# Patient Record
Sex: Female | Born: 1965 | Race: Black or African American | Hispanic: No | Marital: Single | Smoking: Never smoker
Health system: Southern US, Community
[De-identification: ages and names within clinical notes are randomized; demographics above are authoritative.]

## PROBLEM LIST (undated history)

## (undated) DIAGNOSIS — F319 Bipolar disorder, unspecified: Secondary | ICD-10-CM

## (undated) DIAGNOSIS — I10 Essential (primary) hypertension: Secondary | ICD-10-CM

## (undated) DIAGNOSIS — F209 Schizophrenia, unspecified: Secondary | ICD-10-CM

## (undated) DIAGNOSIS — Z8619 Personal history of other infectious and parasitic diseases: Secondary | ICD-10-CM

## (undated) DIAGNOSIS — Z8739 Personal history of other diseases of the musculoskeletal system and connective tissue: Secondary | ICD-10-CM

## (undated) DIAGNOSIS — Z8639 Personal history of other endocrine, nutritional and metabolic disease: Secondary | ICD-10-CM

## (undated) DIAGNOSIS — Z8679 Personal history of other diseases of the circulatory system: Secondary | ICD-10-CM

## (undated) DIAGNOSIS — Z87440 Personal history of urinary (tract) infections: Secondary | ICD-10-CM

## (undated) DIAGNOSIS — R7989 Other specified abnormal findings of blood chemistry: Secondary | ICD-10-CM

## (undated) DIAGNOSIS — Z8659 Personal history of other mental and behavioral disorders: Secondary | ICD-10-CM

## (undated) DIAGNOSIS — Z8249 Family history of ischemic heart disease and other diseases of the circulatory system: Secondary | ICD-10-CM

## (undated) HISTORY — DX: Personal history of other diseases of the musculoskeletal system and connective tissue: Z87.39

## (undated) HISTORY — DX: Family history of ischemic heart disease and other diseases of the circulatory system: Z82.49

## (undated) HISTORY — DX: Personal history of other infectious and parasitic diseases: Z86.19

## (undated) HISTORY — PX: MULTIPLE TOOTH EXTRACTIONS: SHX2053

## (undated) HISTORY — DX: Personal history of other mental and behavioral disorders: Z86.59

## (undated) HISTORY — DX: Bipolar disorder, unspecified: F31.9

## (undated) HISTORY — PX: CYST REMOVAL NECK: SHX6281

## (undated) HISTORY — DX: Essential (primary) hypertension: I10

## (undated) HISTORY — DX: Personal history of urinary (tract) infections: Z87.440

## (undated) HISTORY — PX: ABLATION ON ENDOMETRIOSIS: SHX5787

## (undated) HISTORY — DX: Personal history of other diseases of the circulatory system: Z86.79

## (undated) HISTORY — DX: Personal history of other endocrine, nutritional and metabolic disease: Z86.39

---

## 1998-05-13 ENCOUNTER — Ambulatory Visit (HOSPITAL_COMMUNITY): Admission: RE | Admit: 1998-05-13 | Discharge: 1998-05-13 | Payer: Self-pay

## 1999-02-08 ENCOUNTER — Other Ambulatory Visit: Admission: RE | Admit: 1999-02-08 | Discharge: 1999-02-08 | Payer: Self-pay | Admitting: Family Medicine

## 2003-12-17 ENCOUNTER — Ambulatory Visit (HOSPITAL_COMMUNITY): Admission: RE | Admit: 2003-12-17 | Discharge: 2003-12-17 | Payer: Self-pay | Admitting: Family Medicine

## 2004-10-17 ENCOUNTER — Ambulatory Visit: Payer: Self-pay | Admitting: Internal Medicine

## 2005-01-18 ENCOUNTER — Ambulatory Visit: Payer: Self-pay | Admitting: Internal Medicine

## 2005-01-27 ENCOUNTER — Ambulatory Visit (HOSPITAL_COMMUNITY): Admission: RE | Admit: 2005-01-27 | Discharge: 2005-01-27 | Payer: Self-pay | Admitting: Internal Medicine

## 2005-01-30 ENCOUNTER — Ambulatory Visit: Payer: Self-pay | Admitting: Family Medicine

## 2005-03-30 ENCOUNTER — Ambulatory Visit (HOSPITAL_COMMUNITY): Admission: RE | Admit: 2005-03-30 | Discharge: 2005-03-30 | Payer: Self-pay | Admitting: Internal Medicine

## 2005-03-30 ENCOUNTER — Ambulatory Visit: Payer: Self-pay | Admitting: Family Medicine

## 2005-03-31 ENCOUNTER — Ambulatory Visit: Payer: Self-pay | Admitting: *Deleted

## 2005-04-05 ENCOUNTER — Ambulatory Visit: Payer: Self-pay | Admitting: Family Medicine

## 2005-04-19 ENCOUNTER — Ambulatory Visit: Payer: Self-pay | Admitting: Family Medicine

## 2005-07-26 ENCOUNTER — Ambulatory Visit: Payer: Self-pay | Admitting: Family Medicine

## 2005-10-25 ENCOUNTER — Ambulatory Visit: Payer: Self-pay | Admitting: Family Medicine

## 2006-01-24 ENCOUNTER — Ambulatory Visit: Payer: Self-pay | Admitting: Family Medicine

## 2006-01-30 ENCOUNTER — Ambulatory Visit (HOSPITAL_COMMUNITY): Admission: RE | Admit: 2006-01-30 | Discharge: 2006-01-30 | Payer: Self-pay | Admitting: Family Medicine

## 2006-02-26 ENCOUNTER — Ambulatory Visit: Payer: Self-pay | Admitting: Family Medicine

## 2006-04-25 ENCOUNTER — Ambulatory Visit: Payer: Self-pay | Admitting: Internal Medicine

## 2006-07-25 ENCOUNTER — Ambulatory Visit: Payer: Self-pay | Admitting: Family Medicine

## 2006-10-24 ENCOUNTER — Ambulatory Visit: Payer: Self-pay | Admitting: Internal Medicine

## 2006-12-24 ENCOUNTER — Encounter (INDEPENDENT_AMBULATORY_CARE_PROVIDER_SITE_OTHER): Payer: Self-pay | Admitting: Family Medicine

## 2006-12-24 ENCOUNTER — Ambulatory Visit: Payer: Self-pay | Admitting: Family Medicine

## 2007-02-06 ENCOUNTER — Ambulatory Visit (HOSPITAL_COMMUNITY): Admission: RE | Admit: 2007-02-06 | Discharge: 2007-02-06 | Payer: Self-pay | Admitting: Family Medicine

## 2007-02-20 ENCOUNTER — Ambulatory Visit: Payer: Self-pay | Admitting: Family Medicine

## 2007-06-17 ENCOUNTER — Ambulatory Visit: Payer: Self-pay | Admitting: Internal Medicine

## 2007-08-14 ENCOUNTER — Encounter (INDEPENDENT_AMBULATORY_CARE_PROVIDER_SITE_OTHER): Payer: Self-pay | Admitting: *Deleted

## 2007-10-23 ENCOUNTER — Ambulatory Visit: Payer: Self-pay | Admitting: Internal Medicine

## 2008-02-11 ENCOUNTER — Ambulatory Visit (HOSPITAL_COMMUNITY): Admission: RE | Admit: 2008-02-11 | Discharge: 2008-02-11 | Payer: Self-pay | Admitting: Family Medicine

## 2008-03-04 ENCOUNTER — Encounter: Payer: Self-pay | Admitting: Family Medicine

## 2008-03-04 ENCOUNTER — Ambulatory Visit: Payer: Self-pay | Admitting: Internal Medicine

## 2008-03-04 LAB — CONVERTED CEMR LAB
Chlamydia, DNA Probe: NEGATIVE
GC Probe Amp, Genital: NEGATIVE

## 2008-03-23 ENCOUNTER — Ambulatory Visit: Payer: Self-pay | Admitting: Internal Medicine

## 2008-03-30 ENCOUNTER — Encounter: Payer: Self-pay | Admitting: Family Medicine

## 2008-03-30 ENCOUNTER — Ambulatory Visit: Payer: Self-pay | Admitting: Family Medicine

## 2008-03-30 LAB — CONVERTED CEMR LAB
ALT: 18 units/L (ref 0–35)
AST: 15 units/L (ref 0–37)
Albumin: 4.4 g/dL (ref 3.5–5.2)
Alkaline Phosphatase: 47 units/L (ref 39–117)
BUN: 11 mg/dL (ref 6–23)
Basophils Absolute: 0.1 10*3/uL (ref 0.0–0.1)
Basophils Relative: 1 % (ref 0–1)
CO2: 23 meq/L (ref 19–32)
Calcium: 9.6 mg/dL (ref 8.4–10.5)
Chloride: 108 meq/L (ref 96–112)
Cholesterol: 152 mg/dL (ref 0–200)
Creatinine, Ser: 1.02 mg/dL (ref 0.40–1.20)
Eosinophils Absolute: 0.1 10*3/uL (ref 0.0–0.7)
Eosinophils Relative: 3 % (ref 0–5)
Glucose, Bld: 98 mg/dL (ref 70–99)
HCT: 41.5 % (ref 36.0–46.0)
HDL: 50 mg/dL (ref >39)
Hemoglobin: 13.5 g/dL (ref 12.0–15.0)
LDL Cholesterol: 74 mg/dL (ref 0–99)
Lymphocytes Relative: 37 % (ref 12–46)
Lymphs Abs: 1.8 10*3/uL (ref 0.7–4.0)
MCHC: 32.5 g/dL (ref 30.0–36.0)
MCV: 89.8 fL (ref 78.0–100.0)
Monocytes Absolute: 0.4 10*3/uL (ref 0.1–1.0)
Monocytes Relative: 8 % (ref 3–12)
Neutro Abs: 2.5 10*3/uL (ref 1.7–7.7)
Neutrophils Relative %: 51 % (ref 43–77)
Platelets: 209 10*3/uL (ref 150–400)
Potassium: 4.1 meq/L (ref 3.5–5.3)
RBC: 4.62 M/uL (ref 3.87–5.11)
RDW: 13.6 % (ref 11.5–15.5)
Sodium: 141 meq/L (ref 135–145)
TSH: 1.051 microintl units/mL (ref 0.350–5.50)
Total Bilirubin: 0.5 mg/dL (ref 0.3–1.2)
Total CHOL/HDL Ratio: 3
Total Protein: 6.8 g/dL (ref 6.0–8.3)
Triglycerides: 138 mg/dL (ref <150)
VLDL: 28 mg/dL (ref 0–40)
WBC: 4.8 10*3/uL (ref 4.0–10.5)

## 2008-04-07 ENCOUNTER — Ambulatory Visit: Payer: Self-pay | Admitting: Internal Medicine

## 2008-08-12 ENCOUNTER — Ambulatory Visit: Payer: Self-pay | Admitting: Internal Medicine

## 2009-02-10 ENCOUNTER — Encounter: Payer: Self-pay | Admitting: Family Medicine

## 2009-02-10 ENCOUNTER — Ambulatory Visit: Payer: Self-pay | Admitting: Internal Medicine

## 2009-02-10 LAB — CONVERTED CEMR LAB
ALT: 12 units/L (ref 0–35)
AST: 11 units/L (ref 0–37)
Albumin: 4.3 g/dL (ref 3.5–5.2)
Alkaline Phosphatase: 49 units/L (ref 39–117)
Bilirubin, Direct: 0.2 mg/dL (ref 0.0–0.3)
Indirect Bilirubin: 0.5 mg/dL (ref 0.0–0.9)
Total Bilirubin: 0.7 mg/dL (ref 0.3–1.2)
Total Protein: 6.9 g/dL (ref 6.0–8.3)

## 2009-02-17 ENCOUNTER — Ambulatory Visit (HOSPITAL_COMMUNITY): Admission: RE | Admit: 2009-02-17 | Discharge: 2009-02-17 | Payer: Self-pay | Admitting: Family Medicine

## 2009-05-12 ENCOUNTER — Ambulatory Visit: Payer: Self-pay | Admitting: Internal Medicine

## 2009-11-25 ENCOUNTER — Ambulatory Visit: Payer: Self-pay | Admitting: Internal Medicine

## 2009-12-28 ENCOUNTER — Encounter (INDEPENDENT_AMBULATORY_CARE_PROVIDER_SITE_OTHER): Payer: Self-pay | Admitting: Adult Health

## 2009-12-28 ENCOUNTER — Ambulatory Visit: Payer: Self-pay | Admitting: Internal Medicine

## 2009-12-28 LAB — CONVERTED CEMR LAB
Chlamydia, DNA Probe: NEGATIVE
GC Probe Amp, Genital: NEGATIVE
Microalb, Ur: 0.5 mg/dL (ref 0.00–1.89)
Pap Smear: NEGATIVE

## 2010-01-11 ENCOUNTER — Encounter (INDEPENDENT_AMBULATORY_CARE_PROVIDER_SITE_OTHER): Payer: Self-pay | Admitting: Adult Health

## 2010-01-11 ENCOUNTER — Ambulatory Visit: Payer: Self-pay | Admitting: Internal Medicine

## 2010-01-11 LAB — CONVERTED CEMR LAB
ALT: 15 units/L (ref 0–35)
AST: 12 units/L (ref 0–37)
Albumin: 4.4 g/dL (ref 3.5–5.2)
Alkaline Phosphatase: 59 units/L (ref 39–117)
BUN: 8 mg/dL (ref 6–23)
Basophils Absolute: 0 10*3/uL (ref 0.0–0.1)
Basophils Relative: 1 % (ref 0–1)
CO2: 22 meq/L (ref 19–32)
Calcium: 10.4 mg/dL (ref 8.4–10.5)
Chloride: 106 meq/L (ref 96–112)
Cholesterol: 165 mg/dL (ref 0–200)
Creatinine, Ser: 0.94 mg/dL (ref 0.40–1.20)
Eosinophils Absolute: 0.2 10*3/uL (ref 0.0–0.7)
Eosinophils Relative: 3 % (ref 0–5)
Glucose, Bld: 101 mg/dL — ABNORMAL HIGH (ref 70–99)
HCT: 42.9 % (ref 36.0–46.0)
HDL: 41 mg/dL (ref >39)
Hemoglobin: 13.7 g/dL (ref 12.0–15.0)
LDL Cholesterol: 67 mg/dL (ref 0–99)
Lithium Lvl: 0.57 meq/L — ABNORMAL LOW (ref 0.80–1.40)
Lymphocytes Relative: 33 % (ref 12–46)
Lymphs Abs: 1.7 10*3/uL (ref 0.7–4.0)
MCHC: 31.9 g/dL (ref 30.0–36.0)
MCV: 89 fL (ref 78.0–100.0)
Monocytes Absolute: 0.4 10*3/uL (ref 0.1–1.0)
Monocytes Relative: 7 % (ref 3–12)
Neutro Abs: 3 10*3/uL (ref 1.7–7.7)
Neutrophils Relative %: 57 % (ref 43–77)
Platelets: 208 10*3/uL (ref 150–400)
Potassium: 4.2 meq/L (ref 3.5–5.3)
RBC: 4.82 M/uL (ref 3.87–5.11)
RDW: 13.5 % (ref 11.5–15.5)
Sodium: 141 meq/L (ref 135–145)
TSH: 1.078 microintl units/mL (ref 0.350–4.500)
Total Bilirubin: 0.3 mg/dL (ref 0.3–1.2)
Total CHOL/HDL Ratio: 4
Total Protein: 6.7 g/dL (ref 6.0–8.3)
Triglycerides: 285 mg/dL — ABNORMAL HIGH (ref <150)
VLDL: 57 mg/dL — ABNORMAL HIGH (ref 0–40)
WBC: 5.3 10*3/uL (ref 4.0–10.5)

## 2010-01-25 ENCOUNTER — Ambulatory Visit: Payer: Self-pay | Admitting: Internal Medicine

## 2010-02-09 ENCOUNTER — Encounter (INDEPENDENT_AMBULATORY_CARE_PROVIDER_SITE_OTHER): Payer: Self-pay | Admitting: Adult Health

## 2010-02-09 ENCOUNTER — Ambulatory Visit: Payer: Self-pay | Admitting: Family Medicine

## 2010-02-09 LAB — CONVERTED CEMR LAB
Sed Rate: 2 mm/hr (ref 0–22)
Uric Acid, Serum: 7.4 mg/dL — ABNORMAL HIGH (ref 2.4–7.0)

## 2010-03-17 ENCOUNTER — Ambulatory Visit (HOSPITAL_COMMUNITY): Admission: RE | Admit: 2010-03-17 | Discharge: 2010-03-17 | Payer: Self-pay | Admitting: Internal Medicine

## 2010-03-29 ENCOUNTER — Ambulatory Visit: Payer: Self-pay | Admitting: Internal Medicine

## 2010-06-06 ENCOUNTER — Ambulatory Visit: Payer: Self-pay | Admitting: Family Medicine

## 2010-12-27 NOTE — Miscellaneous (Signed)
Summary: VIP  Patient: Paula Dickson Note: All result statuses are Final unless otherwise noted.  Tests: (1) VIP (Medications)   LLIMPORTMEDS              "Result Below..."       RESULT: AMOXICILLIN CAPS 500 MG*TAKE TWO CAPSULES NOW THEN TAKE ONE CAPSULE FOUR TIMES DAILY*10/23/2006*Last Refill: Unknown*[id number]*******   LLIMPORTALLS              NKDA***  Note: An exclamation mark (!) indicates a result that was not dispersed into the flowsheet. Document Creation Date: 09/26/2007 3:02 PM _______________________________________________________________________  (1) Order result status: Final Collection or observation date-time: 08/14/2007 Requested date-time: 08/14/2007 Receipt date-time:  Reported date-time: 08/14/2007 Referring Physician:   Ordering Physician:   Specimen Source:  Source: [facility] [place] Order Number:  Lab site:

## 2011-02-13 ENCOUNTER — Other Ambulatory Visit (HOSPITAL_COMMUNITY): Payer: Self-pay | Admitting: Family Medicine

## 2011-02-13 DIAGNOSIS — Z1231 Encounter for screening mammogram for malignant neoplasm of breast: Secondary | ICD-10-CM

## 2011-03-20 ENCOUNTER — Ambulatory Visit (HOSPITAL_COMMUNITY): Payer: Self-pay

## 2011-04-03 ENCOUNTER — Ambulatory Visit (HOSPITAL_COMMUNITY)
Admission: RE | Admit: 2011-04-03 | Discharge: 2011-04-03 | Disposition: A | Discharge status: Home / Self Care | Payer: Self-pay | Source: Ambulatory Visit | Attending: Family Medicine | Admitting: Family Medicine

## 2011-04-03 DIAGNOSIS — Z1231 Encounter for screening mammogram for malignant neoplasm of breast: Secondary | ICD-10-CM | POA: Insufficient documentation

## 2012-02-23 ENCOUNTER — Other Ambulatory Visit (HOSPITAL_COMMUNITY): Payer: Self-pay | Admitting: Family Medicine

## 2012-02-23 DIAGNOSIS — Z1231 Encounter for screening mammogram for malignant neoplasm of breast: Secondary | ICD-10-CM

## 2012-04-04 ENCOUNTER — Ambulatory Visit (HOSPITAL_COMMUNITY): Payer: Self-pay

## 2012-04-30 ENCOUNTER — Ambulatory Visit (HOSPITAL_COMMUNITY): Payer: Self-pay

## 2012-05-14 ENCOUNTER — Ambulatory Visit (HOSPITAL_COMMUNITY): Payer: Self-pay

## 2012-05-21 ENCOUNTER — Ambulatory Visit (HOSPITAL_COMMUNITY)
Admission: RE | Admit: 2012-05-21 | Discharge: 2012-05-21 | Disposition: A | Discharge status: Home / Self Care | Payer: Self-pay | Source: Ambulatory Visit | Attending: Family Medicine | Admitting: Family Medicine

## 2012-05-21 DIAGNOSIS — Z1231 Encounter for screening mammogram for malignant neoplasm of breast: Secondary | ICD-10-CM | POA: Insufficient documentation

## 2012-07-12 ENCOUNTER — Institutional Professional Consult (permissible substitution): Payer: Self-pay | Admitting: Cardiovascular Disease

## 2013-01-29 ENCOUNTER — Other Ambulatory Visit (HOSPITAL_COMMUNITY): Payer: Self-pay | Admitting: Internal Medicine

## 2013-01-29 DIAGNOSIS — Z1231 Encounter for screening mammogram for malignant neoplasm of breast: Secondary | ICD-10-CM

## 2013-05-26 ENCOUNTER — Ambulatory Visit (HOSPITAL_COMMUNITY)
Admission: RE | Admit: 2013-05-26 | Discharge: 2013-05-26 | Disposition: A | Discharge status: Home / Self Care | Payer: Self-pay | Source: Ambulatory Visit | Attending: Internal Medicine | Admitting: Internal Medicine

## 2013-05-26 DIAGNOSIS — Z1231 Encounter for screening mammogram for malignant neoplasm of breast: Secondary | ICD-10-CM

## 2013-06-12 ENCOUNTER — Ambulatory Visit: Payer: Self-pay | Admitting: Family Medicine

## 2013-09-25 ENCOUNTER — Telehealth: Payer: Self-pay

## 2013-09-25 NOTE — Telephone Encounter (Signed)
Pt is not a patient with [facility] but pt wanted to know if we gave free flu vaccines for people without insurance. I advised not to my knowledge. Pt voiced understanding.

## 2013-11-21 ENCOUNTER — Telehealth: Payer: Self-pay

## 2013-11-21 NOTE — Telephone Encounter (Signed)
Pt has 3 different offices that pt will be requesting medical records prior to seeing Dr Alphonsus Sias; pt will come by office and sign record releases for the 3 offices today.

## 2014-01-28 ENCOUNTER — Ambulatory Visit: Payer: Self-pay | Admitting: Internal Medicine

## 2014-02-19 ENCOUNTER — Ambulatory Visit: Payer: Self-pay | Admitting: Family Medicine

## 2014-04-02 ENCOUNTER — Other Ambulatory Visit (HOSPITAL_COMMUNITY)
Admission: RE | Admit: 2014-04-02 | Discharge: 2014-04-02 | Disposition: A | Discharge status: Home / Self Care | Payer: 59 | Source: Ambulatory Visit | Attending: Family Medicine | Admitting: Family Medicine

## 2014-04-02 ENCOUNTER — Encounter: Payer: Self-pay | Admitting: Family Medicine

## 2014-04-02 ENCOUNTER — Ambulatory Visit (INDEPENDENT_AMBULATORY_CARE_PROVIDER_SITE_OTHER): Payer: 59 | Admitting: Family Medicine

## 2014-04-02 ENCOUNTER — Telehealth: Payer: Self-pay | Admitting: Family Medicine

## 2014-04-02 VITALS — BP 142/90 | HR 80 | Temp 98.0°F | Ht 62.5 in | Wt 177.8 lb

## 2014-04-02 DIAGNOSIS — Z1151 Encounter for screening for human papillomavirus (HPV): Secondary | ICD-10-CM | POA: Insufficient documentation

## 2014-04-02 DIAGNOSIS — F319 Bipolar disorder, unspecified: Secondary | ICD-10-CM

## 2014-04-02 DIAGNOSIS — Z1211 Encounter for screening for malignant neoplasm of colon: Secondary | ICD-10-CM

## 2014-04-02 DIAGNOSIS — Z136 Encounter for screening for cardiovascular disorders: Secondary | ICD-10-CM

## 2014-04-02 DIAGNOSIS — Z0000 Encounter for general adult medical examination without abnormal findings: Secondary | ICD-10-CM

## 2014-04-02 DIAGNOSIS — Z01419 Encounter for gynecological examination (general) (routine) without abnormal findings: Secondary | ICD-10-CM | POA: Insufficient documentation

## 2014-04-02 DIAGNOSIS — I10 Essential (primary) hypertension: Secondary | ICD-10-CM

## 2014-04-02 LAB — COMPREHENSIVE METABOLIC PANEL
ALT: 23 U/L (ref 0–35)
AST: 22 U/L (ref 0–37)
Albumin: 4.2 g/dL (ref 3.5–5.2)
Alkaline Phosphatase: 46 U/L (ref 39–117)
BUN: 11 mg/dL (ref 6–23)
CO2: 32 mEq/L (ref 19–32)
Calcium: 10.1 mg/dL (ref 8.4–10.5)
Chloride: 99 mEq/L (ref 96–112)
Creatinine, Ser: 1.1 mg/dL (ref 0.4–1.2)
GFR: 66.86 mL/min (ref >60.00)
Glucose, Bld: 91 mg/dL (ref 70–99)
Potassium: 3.2 mEq/L — ABNORMAL LOW (ref 3.5–5.1)
Sodium: 137 mEq/L (ref 135–145)
Total Bilirubin: 0.7 mg/dL (ref 0.2–1.2)
Total Protein: 7.4 g/dL (ref 6.0–8.3)

## 2014-04-02 LAB — CBC WITH DIFFERENTIAL/PLATELET
Basophils Absolute: 0 10*3/uL (ref 0.0–0.1)
Basophils Relative: 0.6 % (ref 0.0–3.0)
Eosinophils Absolute: 0.1 10*3/uL (ref 0.0–0.7)
Eosinophils Relative: 1.7 % (ref 0.0–5.0)
HCT: 42.8 % (ref 36.0–46.0)
Hemoglobin: 14.3 g/dL (ref 12.0–15.0)
Lymphocytes Relative: 31.1 % (ref 12.0–46.0)
Lymphs Abs: 2.1 10*3/uL (ref 0.7–4.0)
MCHC: 33.5 g/dL (ref 30.0–36.0)
MCV: 85.5 fl (ref 78.0–100.0)
Monocytes Absolute: 0.6 10*3/uL (ref 0.1–1.0)
Monocytes Relative: 8.6 % (ref 3.0–12.0)
Neutro Abs: 3.9 10*3/uL (ref 1.4–7.7)
Neutrophils Relative %: 58 % (ref 43.0–77.0)
Platelets: 249 10*3/uL (ref 150.0–400.0)
RBC: 5 Mil/uL (ref 3.87–5.11)
RDW: 13.2 % (ref 11.5–15.5)
WBC: 6.8 10*3/uL (ref 4.0–10.5)

## 2014-04-02 LAB — LIPID PANEL
Cholesterol: 164 mg/dL (ref 0–200)
HDL: 44.9 mg/dL (ref >39.00)
LDL Cholesterol: 79 mg/dL (ref 0–99)
Total CHOL/HDL Ratio: 4
Triglycerides: 201 mg/dL — ABNORMAL HIGH (ref 0.0–149.0)
VLDL: 40.2 mg/dL — ABNORMAL HIGH (ref 0.0–40.0)

## 2014-04-02 LAB — TSH: TSH: 0.53 u[IU]/mL (ref 0.35–4.50)

## 2014-04-02 NOTE — Progress Notes (Signed)
Pre visit review using our clinic review tool, if applicable. No additional management support is needed unless otherwise documented below in the visit note. 

## 2014-04-02 NOTE — Assessment & Plan Note (Signed)
Pap smear today. 

## 2014-04-02 NOTE — Assessment & Plan Note (Signed)
Stable.  Followed by psych (awaiting records).

## 2014-04-02 NOTE — Patient Instructions (Signed)
Great to meet you. We will call you with your lab results and you can view them online.  We will call you with your referral for a colonoscopy.

## 2014-04-02 NOTE — Assessment & Plan Note (Signed)
Mildly elevated but reports h/o white coat HTN. Asymptomatic, continue to monitor.

## 2014-04-02 NOTE — Progress Notes (Signed)
Subjective:   Patient ID: Paula Dickson, female    DOB: 1966-11-11, 48 y.o.   MRN: [id number]  Marquise Lambson is a pleasant 48 y.o. year old female who presents to clinic today with [place]  on 04/02/2014  HPI: G0- has not had a pap smear in years.  Does remember being told she had abnormal mammograms in past. Not currently sexually active. She is perimenopausal. Denies any dysuria or vaginal discharge. Mammogram 05/26/13 No known FH of breast, uterine or cervical CA.  Has never had a colonoscopy.  No FH of colon CA.  HTN- has been stable on current dose of HCTZ.  She reports have white coat HTN.  Bipolar/manic depression- followed by [name].  Taking her medication.  Denies any recent episodes of mania or depression.   Patient Active Problem List   Diagnosis Date Noted  . HTN (hypertension) 04/02/2014  . Routine general medical examination at a health care facility 04/02/2014  . Encounter for routine gynecological examination 04/02/2014  . Manic depression 04/02/2014   Past Medical History  Diagnosis Date  . History of arthritis   . History of chicken pox   . History of depression   . History of genital warts   . history of heart murmur   . History of high blood pressure   . History of thyroid disease   . History of UTI    Past Surgical History  Procedure Laterality Date  . Cyst removal neck      around 11 years ago   History  Substance Use Topics  . Smoking status: Never Smoker   . Smokeless tobacco: Never Used  . Alcohol Use: No   Family History  Problem Relation Age of Onset  . Alcohol abuse Paternal Uncle   . Alcohol abuse Paternal Grandfather   . Arthritis Father   . Breast cancer Maternal Aunt   . Breast cancer Paternal Aunt   . Hyperlipidemia Father   . High blood pressure Sister   . High blood pressure Father   . Diabetes Mother   . Diabetes Sister   . Diabetes Mother   . Mental illness Other     runs in family   No Known Allergies No  current outpatient prescriptions on file prior to visit.   No current facility-administered medications on file prior to visit.   The PMH, PSH, Social History, Family History, Medications, and allergies have been reviewed in [facility], and have been updated if relevant.   Review of Systems See HPI Patient reports no  vision/ hearing changes,anorexia, weight change, fever ,adenopathy, persistant / recurrent hoarseness, swallowing issues, chest pain, edema,persistant / recurrent cough, hemoptysis, dyspnea(rest, exertional, paroxysmal nocturnal), gastrointestinal  bleeding (melena, rectal bleeding), abdominal pain, excessive heart burn, GU symptoms(dysuria, hematuria, pyuria, voiding/incontinence  Issues) syncope, focal weakness, severe memory loss, concerning skin lesions, depression, anxiety, abnormal bruising/bleeding, major joint swelling, breast masses or abnormal vaginal bleeding.       Objective:    BP 142/90  Pulse 80  Temp(Src) 98 F (36.7 C) (Oral)  Ht 5' 2.5" (1.588 m)  Wt 177 lb 12 oz (80.627 kg)  BMI 31.97 kg/m2  SpO2 98%  LMP 01/03/2014   Physical Exam   General:  Well-developed,well-nourished,in no acute distress; alert,appropriate and cooperative throughout examination Head:  normocephalic and atraumatic.   Eyes:  vision grossly intact, pupils equal, pupils round, and pupils reactive to light.   Ears:  R ear normal and L ear normal.   Nose:  no  external deformity.   Mouth:  good dentition.   Neck:  No deformities, masses, or tenderness noted. Breasts:  No mass, nodules, thickening, tenderness, bulging, retraction, inflamation, nipple discharge or skin changes noted.   Lungs:  Normal respiratory effort, chest expands symmetrically. Lungs are clear to auscultation, no crackles or wheezes. Heart:  Normal rate and regular rhythm. S1 and S2 normal without gallop, murmur, click, rub or other extra sounds. Abdomen:  Bowel sounds positive,abdomen soft and non-tender without  masses, organomegaly or hernias noted. Rectal:  no external abnormalities.   Genitalia:  Pelvic Exam:        External: normal female genitalia without lesions or masses        Vagina: normal without lesions or masses        Cervix: normal without lesions or masses        Adnexa: normal bimanual exam without masses or fullness        Uterus: normal by palpation        Pap smear: performed Msk:  No deformity or scoliosis noted of thoracic or lumbar spine.   Extremities:  No clubbing, cyanosis, edema, or deformity noted with normal full range of motion of all joints.   Neurologic:  alert & oriented X3 and gait normal.   Skin:  Intact without suspicious lesions or rashes Cervical Nodes:  No lymphadenopathy noted Axillary Nodes:  No palpable lymphadenopathy Psych:  Cognition and judgment appear intact. Alert and cooperative with normal attention span and concentration. No apparent delusions, illusions, hallucinations       Assessment & Plan:   HTN (hypertension)  Routine general medical examination at a health care facility - Plan: CBC with Differential, Comprehensive metabolic panel, TSH  Encounter for routine gynecological examination  Special screening for malignant neoplasms, colon - Plan: Ambulatory referral to Gastroenterology  Screening for ischemic heart disease - Plan: Lipid panel  Manic depression No Follow-up on file.

## 2014-04-02 NOTE — Assessment & Plan Note (Signed)
Reviewed preventive care protocols, scheduled due services, and updated immunizations Discussed nutrition, exercise, diet, and healthy lifestyle.  Orders Placed This Encounter  Procedures  . CBC with Differential  . Comprehensive metabolic panel  . Lipid panel  . TSH  . Ambulatory referral to Gastroenterology

## 2014-04-02 NOTE — Telephone Encounter (Signed)
Relevant patient education assigned to patient using Emmi. ° °

## 2014-04-03 ENCOUNTER — Other Ambulatory Visit: Payer: Self-pay | Admitting: *Deleted

## 2014-04-03 MED ORDER — HYDROCHLOROTHIAZIDE 25 MG PO TABS: 25.0000 mg | ORAL_TABLET | Freq: Every day | ORAL | Status: DC

## 2014-04-06 ENCOUNTER — Encounter: Payer: Self-pay | Admitting: *Deleted

## 2014-04-07 ENCOUNTER — Encounter: Payer: Self-pay | Admitting: Gastroenterology

## 2014-04-08 ENCOUNTER — Encounter: Payer: Self-pay | Admitting: *Deleted

## 2014-05-05 ENCOUNTER — Ambulatory Visit (AMBULATORY_SURGERY_CENTER): Payer: Self-pay

## 2014-05-05 VITALS — Ht 62.5 in | Wt 178.2 lb

## 2014-05-05 DIAGNOSIS — Z1211 Encounter for screening for malignant neoplasm of colon: Secondary | ICD-10-CM

## 2014-05-05 MED ORDER — MOVIPREP 100 G PO SOLR: ORAL | Status: DC

## 2014-05-05 NOTE — Progress Notes (Signed)
Per pt, no allergies to soy or egg products.Pt not taking any weight loss meds or using  O2 at home. 

## 2014-05-12 ENCOUNTER — Telehealth: Payer: Self-pay

## 2014-05-12 NOTE — Telephone Encounter (Signed)
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 Pt said OK and will ck if other vaccines are needed and call office back for nurse visit if needed.

## 2014-05-18 ENCOUNTER — Ambulatory Visit (AMBULATORY_SURGERY_CENTER): Payer: 59 | Admitting: Gastroenterology

## 2014-05-18 ENCOUNTER — Encounter: Payer: Self-pay | Admitting: Gastroenterology

## 2014-05-18 VITALS — BP 128/62 | HR 63 | Temp 97.8°F | Resp 19 | Ht 62.5 in | Wt 178.0 lb

## 2014-05-18 DIAGNOSIS — D126 Benign neoplasm of colon, unspecified: Secondary | ICD-10-CM

## 2014-05-18 DIAGNOSIS — D125 Benign neoplasm of sigmoid colon: Secondary | ICD-10-CM

## 2014-05-18 DIAGNOSIS — D122 Benign neoplasm of ascending colon: Secondary | ICD-10-CM

## 2014-05-18 DIAGNOSIS — Z1211 Encounter for screening for malignant neoplasm of colon: Secondary | ICD-10-CM

## 2014-05-18 DIAGNOSIS — D120 Benign neoplasm of cecum: Secondary | ICD-10-CM

## 2014-05-18 MED ORDER — SODIUM CHLORIDE 0.9 % IV SOLN
500.0000 mL | INTRAVENOUS | Status: DC
Start: 2014-05-18 — End: 2014-05-18

## 2014-05-18 NOTE — Op Note (Signed)
[place]  [address] [place], [address]   COLONOSCOPY PROCEDURE REPORT  PATIENT: Paula Dickson, Paula Dickson  MR#: [id number] BIRTHDATE: 10/07/1966 , 48  yrs. old GENDER: Female ENDOSCOPIST: Milus Banister, MD REFERRED DJ:MEQAS Aron, M.D. PROCEDURE DATE:  05/18/2014 PROCEDURE:   Colonoscopy with snare polypectomy First Screening Colonoscopy - Avg.  risk and is 50 yrs.  old or older Yes.  Prior Negative Screening - Now for repeat screening. N/A  History of Adenoma - Now for follow-up colonoscopy & has been > or = to 3 yrs.  N/A  Polyps Removed Today? Yes. ASA CLASS:   Class II INDICATIONS:average risk screening. MEDICATIONS: MAC sedation, administered by CRNA and propofol (Diprivan) 350mg  IV  DESCRIPTION OF PROCEDURE:   After the risks benefits and alternatives of the procedure were thoroughly explained, informed consent was obtained.  A digital rectal exam revealed no abnormalities of the rectum.   The LB [id number]  endoscope was introduced through the anus and advanced to the cecum, which was identified by both the appendix and ileocecal valve. No adverse events experienced.   The quality of the prep was excellent.  The instrument was then slowly withdrawn as the colon was fully examined.  COLON FINDINGS: Four polyps were found, removed, three of them were retrieved and sent to pathology.  One was pedunculated, located in cecum, 1.7cm across, removed with snare/cautery (jar 1).  One was pedunculated, 68mm, located in ascending segment, removed with snare/cautery (jar 2).  One was sessile, 26mm, located in sigmoid segment, removed with cold snare, not retrieved.  The last was 11mm, located in sigmoid, removed with snare/cautery, sessile, (jar 3). The examination was otherwise normal.  Retroflexed views revealed no abnormalities. The time to cecum=4 minutes 28 seconds. Withdrawal time=15 minutes 56 seconds.  The scope was withdrawn and the procedure  completed. COMPLICATIONS: There were no complications.  ENDOSCOPIC IMPRESSION: Four polyps were found, removed, three of them were retrieved and sent to pathology. The examination was otherwise normal.  RECOMMENDATIONS: If the polyp(s) removed today are proven to be adenomatous (pre-cancerous) polyps, you will need a colonoscopy in 3 years. Otherwise you should continue to follow colorectal cancer screening guidelines for "routine risk" patients with a colonoscopy in 10 years.  You will receive a letter within 1-2 weeks with the results of your biopsy as well as final recommendations.  Please call my office if you have not received a letter after 3 weeks.   eSigned:  Milus Banister, MD 05/18/2014 10:57 AM

## 2014-05-18 NOTE — Progress Notes (Signed)
Called to room to assist during endoscopic procedure.  Patient ID and intended procedure confirmed with present staff. Received instructions for my participation in the procedure from the performing physician.  

## 2014-05-18 NOTE — Patient Instructions (Signed)
YOU HAD AN ENDOSCOPIC PROCEDURE TODAY AT THE [facility]: Refer to the procedure report that was given to you for any specific questions about what was found during the examination.  If the procedure report does not answer your questions, please call your gastroenterologist to clarify.  If you requested that your care partner not be given the details of your procedure findings, then the procedure report has been included in a sealed envelope for you to review at your convenience later.  YOU SHOULD EXPECT: Some feelings of bloating in the abdomen. Passage of more gas than usual.  Walking can help get rid of the air that was put into your GI tract during the procedure and reduce the bloating. If you had a lower endoscopy (such as a colonoscopy or flexible sigmoidoscopy) you may notice spotting of blood in your stool or on the toilet paper. If you underwent a bowel prep for your procedure, then you may not have a normal bowel movement for a few days.  DIET: Your first meal following the procedure should be a light meal and then it is ok to progress to your normal diet.  A half-sandwich or bowl of soup is an example of a good first meal.  Heavy or fried foods are harder to digest and may make you feel nauseous or bloated.  Likewise meals heavy in dairy and vegetables can cause extra gas to form and this can also increase the bloating.  Drink plenty of fluids but you should avoid alcoholic beverages for 24 hours.  ACTIVITY: Your care partner should take you home directly after the procedure.  You should plan to take it easy, moving slowly for the rest of the day.  You can resume normal activity the day after the procedure however you should NOT DRIVE or use heavy machinery for 24 hours (because of the sedation medicines used during the test).    SYMPTOMS TO REPORT IMMEDIATELY: A gastroenterologist can be reached at any hour.  During normal business hours, 8:30 AM to 5:00 PM Monday through Friday,  call [phone].  After hours and on weekends, please call the GI answering service at [phone] who will take a message and have the physician on call contact you.   Following lower endoscopy (colonoscopy or flexible sigmoidoscopy):  Excessive amounts of blood in the stool  Significant tenderness or worsening of abdominal pains  Swelling of the abdomen that is new, acute  Fever of 100F or higher  FOLLOW UP: If any biopsies were taken you will be contacted by phone or by letter within the next 1-3 weeks.  Call your gastroenterologist if you have not heard about the biopsies in 3 weeks.  Our staff will call the home number listed on your records the next business day following your procedure to check on you and address any questions or concerns that you may have at that time regarding the information given to you following your procedure. This is a courtesy call and so if there is no answer at the home number and we have not heard from you through the emergency physician on call, we will assume that you have returned to your regular daily activities without incident.  SIGNATURES/CONFIDENTIALITY: You and/or your care partner have signed paperwork which will be entered into your electronic medical record.  These signatures attest to the fact that that the information above on your After Visit Summary has been reviewed and is understood.  Full responsibility of the confidentiality of this   discharge information lies with you and/or your care-partner.  Please read the handout on polyps.

## 2014-05-18 NOTE — Progress Notes (Signed)
Report to PACU, RN, vss, BBS= Clear.  

## 2014-05-19 ENCOUNTER — Telehealth: Payer: Self-pay | Admitting: *Deleted

## 2014-05-19 NOTE — Telephone Encounter (Signed)
  Follow up Call-  Call back number 05/18/2014  Post procedure Call Back phone  # [phone]  Permission to leave phone message Yes     Patient questions:  Do you have a fever, pain , or abdominal swelling? no Pain Score  0 *  Have you tolerated food without any problems? yes  Have you been able to return to your normal activities? yes  Do you have any questions about your discharge instructions: Diet   no Medications  no Follow up visit  no  Do you have questions or concerns about your Care? no  Actions: * If pain score is 4 or above: No action needed, pain <4.

## 2014-05-20 ENCOUNTER — Encounter: Payer: 59 | Admitting: Gastroenterology

## 2014-05-26 ENCOUNTER — Encounter: Payer: Self-pay | Admitting: Gastroenterology

## 2014-06-04 ENCOUNTER — Telehealth: Payer: Self-pay | Admitting: Family Medicine

## 2014-06-04 NOTE — Telephone Encounter (Signed)
Pt called requesting a diet that you recommend that she could follow after finding 4 polyps that were pre-cancerous during her colonoscopy on 6/22. GI recommended her return for another colonoscopy in 3 years and if there was a better diet she chould follow so that her results would be better. Pt states if there is a packet she could pick up since she only lives a few miles away. Please advise!

## 2014-06-08 NOTE — Telephone Encounter (Signed)
Unable to find anything related to a diet. Spoke to patient and she stated that she will contact the GI doctor and see if they have any recommendations. Patient also stated that she will do some research on the internet.

## 2014-06-08 NOTE — Telephone Encounter (Signed)
I do not think we have hand out like this.  GI likely does or we may have one with Emmi.  Would you mind looking into this for me?

## 2014-06-23 ENCOUNTER — Other Ambulatory Visit: Payer: Self-pay | Admitting: Family Medicine

## 2014-06-23 DIAGNOSIS — Z1231 Encounter for screening mammogram for malignant neoplasm of breast: Secondary | ICD-10-CM

## 2014-07-07 ENCOUNTER — Ambulatory Visit (HOSPITAL_COMMUNITY)
Admission: RE | Admit: 2014-07-07 | Discharge: 2014-07-07 | Disposition: A | Discharge status: Home / Self Care | Payer: 59 | Source: Ambulatory Visit | Attending: Family Medicine | Admitting: Family Medicine

## 2014-07-07 DIAGNOSIS — Z1231 Encounter for screening mammogram for malignant neoplasm of breast: Secondary | ICD-10-CM | POA: Diagnosis present

## 2014-07-08 ENCOUNTER — Ambulatory Visit (INDEPENDENT_AMBULATORY_CARE_PROVIDER_SITE_OTHER): Payer: 59 | Admitting: Family Medicine

## 2014-07-08 ENCOUNTER — Encounter: Payer: Self-pay | Admitting: Family Medicine

## 2014-07-08 VITALS — BP 134/78 | HR 67 | Temp 98.3°F | Ht 62.25 in | Wt 171.5 lb

## 2014-07-08 DIAGNOSIS — Z23 Encounter for immunization: Secondary | ICD-10-CM

## 2014-07-08 DIAGNOSIS — I10 Essential (primary) hypertension: Secondary | ICD-10-CM

## 2014-07-08 DIAGNOSIS — Z0289 Encounter for other administrative examinations: Secondary | ICD-10-CM

## 2014-07-08 DIAGNOSIS — Z0000 Encounter for general adult medical examination without abnormal findings: Secondary | ICD-10-CM

## 2014-07-08 NOTE — Progress Notes (Signed)
Subjective:   Patient ID: Paula Dickson, female    DOB: 12/04/1965, 48 y.o.   MRN: [id number]  Paula Dickson is a pleasant 48 y.o. year old female who presents to clinic today with Annual Exam  on 07/08/2014  HPI: Was here for CPX but just had CPX 3 months ago.  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  Form reviewed- asking if she has limitations and requires TB skin test and Tdap.  Current Outpatient Prescriptions on File Prior to Visit  Medication Sig Dispense Refill  . hydrochlorothiazide (HYDRODIURIL) 25 MG tablet Take 1 tablet (25 mg total) by mouth daily.  90 tablet  1  . lithium carbonate 150 MG capsule Take 1 capsule by mouth 3 (three) times daily.      . Multiple Vitamin (MULTIVITAMIN) capsule Take 1 capsule by mouth daily.      Marland Kitchen thioridazine (MELLARIL) 25 MG tablet Take by mouth. Take one tab by mouth in the morning, take one tablet by mouth at noon, and one-half tab at bedtime       No current facility-administered medications on file prior to visit.    No Known Allergies  Past Medical History  Diagnosis Date  . History of arthritis   . History of chicken pox   . History of depression   . History of genital warts   . history of heart murmur   . History of high blood pressure   . History of thyroid disease   . History of UTI   . Bipolar affective disorder   . Hypertension     Past Surgical History  Procedure Laterality Date  . Cyst removal neck      around 11 years ago /benign  . Multiple tooth extractions    . Ablation on endometriosis      Family History  Problem Relation Age of Onset  . Alcohol abuse Paternal Uncle   . Alcohol abuse Paternal Grandfather   . Arthritis Father   . Hyperlipidemia Father   . High blood pressure Father   . Breast cancer Maternal Aunt   . Breast cancer Paternal Aunt   . High blood pressure Sister   . Diabetes Sister   . Diabetes Mother   . Mental illness Other     runs in family  . Diabetes Brother   .  Mental illness Brother     History   Social History  . Marital Status: Single    Spouse Name: N/A    Number of Children: N/A  . Years of Education: N/A   Occupational History  . Not on file.   Social History Main Topics  . Smoking status: Never Smoker   . Smokeless tobacco: Never Used  . Alcohol Use: No  . Drug Use: No  . Sexual Activity: Not on file   Other Topics Concern  . Not on file   Social History Narrative  . No narrative on file   The PMH, PSH, Social History, Family History, Medications, and allergies have been reviewed in [facility], and have been updated if relevant.     Review of Systems    See denies any reason why she cannot be around children- no CP, SOB, dizziness or back pain. No recent anxiety, depression or fluctuations in mood. Objective:    BP 134/78  Pulse 67  Temp(Src) 98.3 F (36.8 C) (Oral)  Ht 5' 2.25" (1.581 m)  Wt 171 lb 8 oz (77.792 kg)  BMI 31.12 kg/m2  SpO2  98%   Physical Exam  General:  Well-developed,well-nourished,in no acute distress; alert,appropriate and cooperative throughout examination Head:  normocephalic and atraumatic.   Psych:  Cognition and judgment appear intact. Alert and cooperative with normal attention span and concentration. No apparent delusions, illusions, hallucinations        Assessment & Plan:   Routine general medical examination at a health care facility  Essential hypertension No Follow-up on file.

## 2014-07-08 NOTE — Progress Notes (Signed)
Pre visit review using our clinic review tool, if applicable. No additional management support is needed unless otherwise documented below in the visit note. 

## 2014-07-08 NOTE — Assessment & Plan Note (Signed)
>  15 minutes spent in face to face time with patient, >50% spent in counselling or coordination of care Form completed and returned to pt. Tdap and TB skin test given today.

## 2014-07-10 LAB — TB SKIN TEST
Induration: 1 mm
TB Skin Test: NEGATIVE

## 2014-08-27 ENCOUNTER — Other Ambulatory Visit: Payer: Self-pay | Admitting: Family Medicine

## 2014-08-27 ENCOUNTER — Ambulatory Visit (INDEPENDENT_AMBULATORY_CARE_PROVIDER_SITE_OTHER): Payer: 59 | Admitting: Family Medicine

## 2014-08-27 ENCOUNTER — Encounter: Payer: Self-pay | Admitting: Family Medicine

## 2014-08-27 VITALS — BP 118/74 | HR 68 | Temp 98.5°F | Wt 171.5 lb

## 2014-08-27 DIAGNOSIS — N644 Mastodynia: Secondary | ICD-10-CM

## 2014-08-27 NOTE — Assessment & Plan Note (Signed)
New- reassuring exam and recent mammogram. ?due to poor fitting bra. Given persistent symptoms, will proceed with diagnostic mammogram/ultrasound if needed.

## 2014-08-27 NOTE — Patient Instructions (Signed)
Good to see you. Please stop by to see [name] on your way out to set up your mammogram. Try using a different bra to see if this could help.

## 2014-08-27 NOTE — Progress Notes (Signed)
Pre visit review using our clinic review tool, if applicable. No additional management support is needed unless otherwise documented below in the visit note. 

## 2014-08-27 NOTE — Progress Notes (Signed)
Subjective:   Patient ID: Paula Dickson, female    DOB: 09-May-1966, 48 y.o.   MRN: [id number]  Paula Dickson is a pleasant 48 y.o. year old female who presents to clinic today with Breast Pain  on 08/27/2014  HPI:  Bilateral breast pain on and off for 1 week. Left > right.  Notices it is worse in the morning before she puts her bra on. She has not been wearing an underwire bra. She is going through menopause. Has not noticed any masses, nipple changes or nipple discharge.  Neg mammogram on 07/07/14:   EXAM:  DIGITAL SCREENING BILATERAL MAMMOGRAM WITH CAD  COMPARISON: Previous exam(s).  ACR Breast Density Category c: The breast tissue is heterogeneously  dense, which may obscure small masses.  FINDINGS:  There are no findings suspicious for malignancy. Images were  processed with CAD.  IMPRESSION:  No mammographic evidence of malignancy. A result letter of this  screening mammogram will be mailed directly to the patient.  RECOMMENDATION:  Screening mammogram in one year. (Code:SM-B-01Y)  BI-RADS CATEGORY 1: Negative.  Electronically Signed  By: Conchita Paris M.D.  On: 07/07/2014 11:14  Current Outpatient Prescriptions on File Prior to Visit  Medication Sig Dispense Refill  . hydrochlorothiazide (HYDRODIURIL) 25 MG tablet Take 1 tablet (25 mg total) by mouth daily.  90 tablet  1  . lithium carbonate 150 MG capsule Take 1 capsule by mouth 3 (three) times daily.      . Multiple Vitamin (MULTIVITAMIN) capsule Take 1 capsule by mouth daily.      Marland Kitchen thioridazine (MELLARIL) 25 MG tablet Take by mouth. Take one tab by mouth in the morning, take one tablet by mouth at noon, and one-half tab at bedtime       No current facility-administered medications on file prior to visit.    No Known Allergies  Past Medical History  Diagnosis Date  . History of arthritis   . History of chicken pox   . History of depression   . History of genital warts   . history of heart murmur   .  History of high blood pressure   . History of thyroid disease   . History of UTI   . Bipolar affective disorder   . Hypertension     Past Surgical History  Procedure Laterality Date  . Cyst removal neck      around 11 years ago /benign  . Multiple tooth extractions    . Ablation on endometriosis      Family History  Problem Relation Age of Onset  . Alcohol abuse Paternal Uncle   . Alcohol abuse Paternal Grandfather   . Arthritis Father   . Hyperlipidemia Father   . High blood pressure Father   . Breast cancer Maternal Aunt   . Breast cancer Paternal Aunt   . High blood pressure Sister   . Diabetes Sister   . Diabetes Mother   . Mental illness Other     runs in family  . Diabetes Brother   . Mental illness Brother     History   Social History  . Marital Status: Single    Spouse Name: N/A    Number of Children: N/A  . Years of Education: N/A   Occupational History  . Not on file.   Social History Main Topics  . Smoking status: Never Smoker   . Smokeless tobacco: Never Used  . Alcohol Use: No  . Drug Use: No  . Sexual Activity: Not  on file   Other Topics Concern  . Not on file   Social History Narrative  . No narrative on file   The PMH, PSH, Social History, Family History, Medications, and allergies have been reviewed in [facility], and have been updated if relevant.  Review of Systems    See HPI No fevers No night sweats No fatigue Objective:    BP 118/74  Pulse 68  Temp(Src) 98.5 F (36.9 C) (Oral)  Wt 171 lb 8 oz (77.792 kg)  SpO2 97%   Physical Exam  Nursing note and vitals reviewed. Constitutional: She is oriented to person, place, and time. She appears well-developed and well-nourished. No distress.  HENT:  Head: Normocephalic.  Pulmonary/Chest: She exhibits no mass and no tenderness. Right breast exhibits no inverted nipple, no mass, no nipple discharge, no skin change and no tenderness. Left breast exhibits no inverted nipple, no mass, no  nipple discharge, no skin change and no tenderness. Breasts are symmetrical.  Neurological: She is alert and oriented to person, place, and time.  Skin: Skin is warm and dry.  Psychiatric: She has a normal mood and affect. Her behavior is normal. Judgment and thought content normal.          Assessment & Plan:   Breast pain in female No Follow-up on file.

## 2014-08-31 ENCOUNTER — Ambulatory Visit
Admission: RE | Admit: 2014-08-31 | Discharge: 2014-08-31 | Disposition: A | Discharge status: Home / Self Care | Payer: 59 | Source: Ambulatory Visit | Attending: Family Medicine | Admitting: Family Medicine

## 2014-08-31 DIAGNOSIS — N644 Mastodynia: Secondary | ICD-10-CM

## 2014-09-11 ENCOUNTER — Other Ambulatory Visit: Payer: Self-pay

## 2014-09-11 ENCOUNTER — Encounter: Payer: Self-pay | Admitting: Family Medicine

## 2014-09-15 ENCOUNTER — Other Ambulatory Visit: Payer: Self-pay | Admitting: *Deleted

## 2014-09-15 MED ORDER — HYDROCHLOROTHIAZIDE 25 MG PO TABS: 25.0000 mg | ORAL_TABLET | Freq: Every day | ORAL | Status: DC

## 2014-12-07 ENCOUNTER — Telehealth: Payer: Self-pay | Admitting: *Deleted

## 2014-12-07 NOTE — Telephone Encounter (Signed)
Pt came into office and dropped off a form for completion for employer. LM with person at pt's home requesting a call back to confirm if immunizations are required, or optional

## 2014-12-10 ENCOUNTER — Ambulatory Visit: Payer: 59

## 2015-02-12 ENCOUNTER — Telehealth: Payer: Self-pay

## 2015-02-12 NOTE — Telephone Encounter (Signed)
Pt left v/m requesting tamiflu sent to [address]; pt feels like "coming down with flu". No further message left and cannot reach pt by phone.

## 2015-02-12 NOTE — Telephone Encounter (Signed)
Spoke with patient.  Patient is not displaying any of the symptoms described, maybe a little fever but low grade, no cough, no body aches.  Her Dad had some diarrhea earlier in the week but is better now.  No exposure to flu that she is aware of.  Patient advised to get rest, drink lots of fluids, use Tylenol as needed.  Patient voiced understanding and agreed.

## 2015-02-12 NOTE — Telephone Encounter (Signed)
Need more info: Fever? Chills, cough? Body aches? Exposure to flu? If so, may phone in tamiflu 75mg  bid x 5 days. If not rec monitor sxs and update [place] next week. If we cant' reach patient don't recommend sending in tamiflu.

## 2015-03-19 ENCOUNTER — Other Ambulatory Visit: Payer: Self-pay | Admitting: *Deleted

## 2015-03-19 MED ORDER — HYDROCHLOROTHIAZIDE 25 MG PO TABS: 25.0000 mg | ORAL_TABLET | Freq: Every day | ORAL | Status: DC

## 2015-04-20 ENCOUNTER — Encounter: Payer: Self-pay | Admitting: Family Medicine

## 2015-04-21 ENCOUNTER — Other Ambulatory Visit: Payer: Self-pay | Admitting: Family Medicine

## 2015-04-21 DIAGNOSIS — M21611 Bunion of right foot: Secondary | ICD-10-CM

## 2015-04-27 NOTE — Telephone Encounter (Signed)
Pt is scheduled for Dr. Jacqualyn Posey and [place] authorization is Completed.  [id number] 6 visits or until 10/28/2015 whichever comes first.  Left message to inform patient. / lt

## 2015-05-06 ENCOUNTER — Ambulatory Visit (INDEPENDENT_AMBULATORY_CARE_PROVIDER_SITE_OTHER): Payer: 59

## 2015-05-06 ENCOUNTER — Ambulatory Visit (INDEPENDENT_AMBULATORY_CARE_PROVIDER_SITE_OTHER): Payer: 59 | Admitting: Podiatry

## 2015-05-06 DIAGNOSIS — M2012 Hallux valgus (acquired), left foot: Secondary | ICD-10-CM

## 2015-05-06 DIAGNOSIS — M21612 Bunion of left foot: Secondary | ICD-10-CM

## 2015-05-06 DIAGNOSIS — M2011 Hallux valgus (acquired), right foot: Secondary | ICD-10-CM | POA: Diagnosis not present

## 2015-05-06 DIAGNOSIS — M21611 Bunion of right foot: Secondary | ICD-10-CM

## 2015-05-06 NOTE — Patient Instructions (Signed)
Bunion You have a bunion deformity of the feet. This is more common in women. It tends to be an inherited problem. Symptoms can include pain, swelling, and deformity around the great toe. Numbness and tingling may also be present. Your symptoms are often worsened by wearing shoes that cause pressure on the bunion. Changing the type of shoes you wear helps reduce symptoms. A wide shoe decreases pressure on the bunion. An arch support may be used if you have flat feet. Avoid shoes with heels higher than two inches. This puts more pressure on the bunion. X-rays may be helpful in evaluating the severity of the problem. Other foot problems often seen with bunions include corns, calluses, and hammer toes. If the deformity or pain is severe, surgical treatment may be necessary. Keep off your painful foot as much as possible until the pain is relieved. Call your caregiver if your symptoms are worse.  SEEK IMMEDIATE MEDICAL CARE IF:  You have increased redness, pain, swelling, or other symptoms of infection. Document Released: 11/13/2005 Document Revised: 02/05/2012 Document Reviewed: 05/13/2007 [facility] Patient Information 2015 [place], [place]. This information is not intended to replace advice given to you by your health care provider. Make sure you discuss any questions you have with your health care provider.

## 2015-05-06 NOTE — Progress Notes (Signed)
   Subjective:    Patient ID: Paula Dickson, female    DOB: [birth year], 49 y.o.   MRN: [id number]  HPI 49 year old data presents the office today with concerns of bilateral bunions. She said that she's had bunions for several years and she is on any problems with them. She states that she does is concerned about the way they look. She denies any pain to both of her feet and denies a history of injury or trauma. She denies any difficulty wearing shoes although it is difficult to find a pair of shoes that fit. She said no prior treatment. No other complaints at this time.   Review of Systems  Endocrine:       Increase urination   All other systems reviewed and are negative.      Objective:   Physical Exam AAO x3, NAD DP/PT pulses palpable bilaterally, CRT less than 3 seconds Protective sensation intact with Simms Weinstein monofilament, vibratory sensation intact, Achilles tendon reflex intact There is bilateral structural HAV deformity present with a right more significant than the left. There is no tenderness overlying the bunion prominences. There is no pain with 1st MTPJ ROM. There is no hypermobility present. There is a decrease in medial arch height upon weightbearing with equinus present. There is a decrease in subtalar joint range of motion of the left side bothers no pain range of motion is no crepitation. No areas of tenderness to bilateral lower extremities. MMT 5/5, ROM WNL.  No open lesions or pre-ulcerative lesions.  No overlying edema, erythema, increase in warmth to bilateral lower extremities.  No pain with calf compression, swelling, warmth, erythema bilaterally.       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  -Treatment options discussed including all alternatives, risks, and complications -Discussed likely etiology of the bunions. -Discussed both conservative and surgical options with  the patient. At this time as she has not tried any conservative treatment he is having pain. I recommended conservative treatment. I discussed with her orthotics, shoe gear modifications. As discussed the padding and offloading. If she desires surgical correction I did discuss the surgery with her. -Continue to monitor for progression of the bunions or any increase in symptoms. Follow-up as needed. In the meantime I encouraged her to call the office with any questions, concerns, change in symptoms.

## 2015-06-10 ENCOUNTER — Other Ambulatory Visit: Payer: Self-pay | Admitting: Family Medicine

## 2015-06-10 DIAGNOSIS — Z1231 Encounter for screening mammogram for malignant neoplasm of breast: Secondary | ICD-10-CM

## 2015-06-12 ENCOUNTER — Encounter (HOSPITAL_COMMUNITY): Payer: Self-pay | Admitting: Emergency Medicine

## 2015-06-12 ENCOUNTER — Emergency Department (HOSPITAL_COMMUNITY)
Admission: EM | Admit: 2015-06-12 | Discharge: 2015-06-12 | Disposition: A | Discharge status: Home / Self Care | Payer: 59 | Source: Home / Self Care | Attending: Emergency Medicine | Admitting: Emergency Medicine

## 2015-06-12 DIAGNOSIS — F319 Bipolar disorder, unspecified: Secondary | ICD-10-CM | POA: Diagnosis present

## 2015-06-12 DIAGNOSIS — E079 Disorder of thyroid, unspecified: Secondary | ICD-10-CM | POA: Diagnosis present

## 2015-06-12 DIAGNOSIS — Z833 Family history of diabetes mellitus: Secondary | ICD-10-CM

## 2015-06-12 DIAGNOSIS — Z87440 Personal history of urinary (tract) infections: Secondary | ICD-10-CM

## 2015-06-12 DIAGNOSIS — Z8739 Personal history of other diseases of the musculoskeletal system and connective tissue: Secondary | ICD-10-CM

## 2015-06-12 DIAGNOSIS — R631 Polydipsia: Secondary | ICD-10-CM | POA: Diagnosis present

## 2015-06-12 DIAGNOSIS — Z8639 Personal history of other endocrine, nutritional and metabolic disease: Secondary | ICD-10-CM | POA: Insufficient documentation

## 2015-06-12 DIAGNOSIS — F29 Unspecified psychosis not due to a substance or known physiological condition: Secondary | ICD-10-CM | POA: Diagnosis not present

## 2015-06-12 DIAGNOSIS — R011 Cardiac murmur, unspecified: Secondary | ICD-10-CM

## 2015-06-12 DIAGNOSIS — E876 Hypokalemia: Secondary | ICD-10-CM | POA: Diagnosis present

## 2015-06-12 DIAGNOSIS — I10 Essential (primary) hypertension: Secondary | ICD-10-CM

## 2015-06-12 DIAGNOSIS — F419 Anxiety disorder, unspecified: Secondary | ICD-10-CM

## 2015-06-12 DIAGNOSIS — Z8619 Personal history of other infectious and parasitic diseases: Secondary | ICD-10-CM | POA: Insufficient documentation

## 2015-06-12 DIAGNOSIS — Z9114 Patient's other noncompliance with medication regimen: Secondary | ICD-10-CM | POA: Diagnosis present

## 2015-06-12 DIAGNOSIS — Z8249 Family history of ischemic heart disease and other diseases of the circulatory system: Secondary | ICD-10-CM

## 2015-06-12 DIAGNOSIS — E871 Hypo-osmolality and hyponatremia: Secondary | ICD-10-CM | POA: Diagnosis present

## 2015-06-12 DIAGNOSIS — Z79899 Other long term (current) drug therapy: Secondary | ICD-10-CM | POA: Insufficient documentation

## 2015-06-12 DIAGNOSIS — G92 Toxic encephalopathy: Secondary | ICD-10-CM | POA: Diagnosis present

## 2015-06-12 DIAGNOSIS — R410 Disorientation, unspecified: Secondary | ICD-10-CM | POA: Diagnosis not present

## 2015-06-12 DIAGNOSIS — N179 Acute kidney failure, unspecified: Secondary | ICD-10-CM | POA: Diagnosis present

## 2015-06-12 LAB — LITHIUM LEVEL: Lithium Lvl: 0.63 mmol/L (ref 0.60–1.20)

## 2015-06-12 NOTE — ED Notes (Signed)
Pt denies SI/HI and states that she just wants to ensure that her lithium levels are therapeutic.  She has family coming in to the area tomorrow to support her as she works to manage her situation and anxiety, and she is looking forward to having the extra help as she transitions.  She denies any medical needs at this time and is sitting comfortably in a chair.  A/O x 4, ambulatory, and tolerating PO fluids without difficulty. No acute distress noted and no obvious risk for self- or other-directed harm.

## 2015-06-12 NOTE — Discharge Instructions (Signed)
Panic Attacks °Panic attacks are sudden, short feelings of great fear or discomfort. You may have them for no reason when you are relaxed, when you are uneasy (anxious), or when you are sleeping.  °HOME CARE °· Take all your medicines as told. °· Check with your doctor before starting new medicines. °· Keep all doctor visits. °GET HELP IF: °· You are not able to take your medicines as told. °· Your symptoms do not get better. °· Your symptoms get worse. °GET HELP RIGHT AWAY IF: °· Your attacks seem different than your normal attacks. °· You have thoughts about hurting yourself or others. °· You take panic attack medicine and you have a side effect. °MAKE SURE YOU: °· Understand these instructions. °· Will watch your condition. °· Will get help right away if you are not doing well or get worse. °Document Released: 12/16/2010 Document Revised: 09/03/2013 Document Reviewed: 06/27/2013 °[employer]® Patient Information ©2015 [employer]. This information is not intended to replace advice given to you by your health care provider. Make sure you discuss any questions you have with your health care provider. ° °
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
## 2015-06-13 ENCOUNTER — Encounter (HOSPITAL_COMMUNITY): Payer: Self-pay

## 2015-06-13 ENCOUNTER — Inpatient Hospital Stay (HOSPITAL_COMMUNITY)
Admission: EM | Admit: 2015-06-13 | Discharge: 2015-06-16 | DRG: 885 | Disposition: A | Discharge status: Home / Self Care | Payer: 59 | Attending: Internal Medicine | Admitting: Internal Medicine

## 2015-06-13 ENCOUNTER — Inpatient Hospital Stay (HOSPITAL_COMMUNITY): Payer: 59

## 2015-06-13 DIAGNOSIS — G9340 Encephalopathy, unspecified: Secondary | ICD-10-CM | POA: Diagnosis not present

## 2015-06-13 DIAGNOSIS — Z833 Family history of diabetes mellitus: Secondary | ICD-10-CM | POA: Diagnosis not present

## 2015-06-13 DIAGNOSIS — F319 Bipolar disorder, unspecified: Secondary | ICD-10-CM | POA: Diagnosis present

## 2015-06-13 DIAGNOSIS — E871 Hypo-osmolality and hyponatremia: Secondary | ICD-10-CM | POA: Diagnosis present

## 2015-06-13 DIAGNOSIS — F3113 Bipolar disorder, current episode manic without psychotic features, severe: Secondary | ICD-10-CM | POA: Diagnosis not present

## 2015-06-13 DIAGNOSIS — N179 Acute kidney failure, unspecified: Secondary | ICD-10-CM | POA: Insufficient documentation

## 2015-06-13 DIAGNOSIS — E876 Hypokalemia: Secondary | ICD-10-CM | POA: Diagnosis present

## 2015-06-13 DIAGNOSIS — G92 Toxic encephalopathy: Secondary | ICD-10-CM | POA: Diagnosis present

## 2015-06-13 DIAGNOSIS — F312 Bipolar disorder, current episode manic severe with psychotic features: Secondary | ICD-10-CM

## 2015-06-13 DIAGNOSIS — F29 Unspecified psychosis not due to a substance or known physiological condition: Secondary | ICD-10-CM | POA: Diagnosis present

## 2015-06-13 DIAGNOSIS — Z9114 Patient's other noncompliance with medication regimen: Secondary | ICD-10-CM | POA: Diagnosis present

## 2015-06-13 DIAGNOSIS — Z8249 Family history of ischemic heart disease and other diseases of the circulatory system: Secondary | ICD-10-CM | POA: Diagnosis not present

## 2015-06-13 DIAGNOSIS — E079 Disorder of thyroid, unspecified: Secondary | ICD-10-CM | POA: Diagnosis present

## 2015-06-13 DIAGNOSIS — F419 Anxiety disorder, unspecified: Secondary | ICD-10-CM | POA: Diagnosis present

## 2015-06-13 DIAGNOSIS — Z79899 Other long term (current) drug therapy: Secondary | ICD-10-CM | POA: Diagnosis not present

## 2015-06-13 DIAGNOSIS — R631 Polydipsia: Secondary | ICD-10-CM | POA: Diagnosis present

## 2015-06-13 DIAGNOSIS — I10 Essential (primary) hypertension: Secondary | ICD-10-CM | POA: Diagnosis present

## 2015-06-13 DIAGNOSIS — R410 Disorientation, unspecified: Secondary | ICD-10-CM | POA: Diagnosis present

## 2015-06-13 LAB — CBC WITH DIFFERENTIAL/PLATELET
Basophils Absolute: 0 10*3/uL (ref 0.0–0.1)
Basophils Relative: 0 % (ref 0–1)
Eosinophils Absolute: 0 10*3/uL (ref 0.0–0.7)
Eosinophils Relative: 0 % (ref 0–5)
HCT: 31.8 % — ABNORMAL LOW (ref 36.0–46.0)
Hemoglobin: 11.4 g/dL — ABNORMAL LOW (ref 12.0–15.0)
Lymphocytes Relative: 19 % (ref 12–46)
Lymphs Abs: 1.4 10*3/uL (ref 0.7–4.0)
MCH: 28.4 pg (ref 26.0–34.0)
MCHC: 35.8 g/dL (ref 30.0–36.0)
MCV: 79.1 fL (ref 78.0–100.0)
Monocytes Absolute: 0.5 10*3/uL (ref 0.1–1.0)
Monocytes Relative: 7 % (ref 3–12)
Neutro Abs: 5.4 10*3/uL (ref 1.7–7.7)
Neutrophils Relative %: 74 % (ref 43–77)
Platelets: 244 10*3/uL (ref 150–400)
RBC: 4.02 MIL/uL (ref 3.87–5.11)
RDW: 12.2 % (ref 11.5–15.5)
WBC: 7.3 10*3/uL (ref 4.0–10.5)

## 2015-06-13 LAB — BASIC METABOLIC PANEL
Anion gap: 12 (ref 5–15)
BUN: 9 mg/dL (ref 6–20)
CO2: 28 mmol/L (ref 22–32)
Calcium: 9.7 mg/dL (ref 8.9–10.3)
Chloride: 80 mmol/L — ABNORMAL LOW (ref 101–111)
Creatinine, Ser: 1.07 mg/dL — ABNORMAL HIGH (ref 0.44–1.00)
GFR calc Af Amer: >60 mL/min (ref >60)
GFR calc non Af Amer: 59 mL/min — ABNORMAL LOW (ref >60)
Glucose, Bld: 127 mg/dL — ABNORMAL HIGH (ref 65–99)
Potassium: 2.1 mmol/L — CL (ref 3.5–5.1)
Sodium: 120 mmol/L — ABNORMAL LOW (ref 135–145)

## 2015-06-13 LAB — SALICYLATE LEVEL: Salicylate Lvl: <4 mg/dL (ref 2.8–30.0)

## 2015-06-13 LAB — RAPID URINE DRUG SCREEN, HOSP PERFORMED
Amphetamines: NOT DETECTED
Barbiturates: NOT DETECTED
Benzodiazepines: POSITIVE — AB
Cocaine: NOT DETECTED
Opiates: NOT DETECTED
Tetrahydrocannabinol: NOT DETECTED

## 2015-06-13 LAB — CBC
HCT: 32.8 % — ABNORMAL LOW (ref 36.0–46.0)
Hemoglobin: 11.8 g/dL — ABNORMAL LOW (ref 12.0–15.0)
MCH: 28.4 pg (ref 26.0–34.0)
MCHC: 36 g/dL (ref 30.0–36.0)
MCV: 79 fL (ref 78.0–100.0)
Platelets: 240 10*3/uL (ref 150–400)
RBC: 4.15 MIL/uL (ref 3.87–5.11)
RDW: 12.3 % (ref 11.5–15.5)
WBC: 7.6 10*3/uL (ref 4.0–10.5)

## 2015-06-13 LAB — LITHIUM LEVEL: Lithium Lvl: 0.52 mmol/L — ABNORMAL LOW (ref 0.60–1.20)

## 2015-06-13 LAB — ETHANOL: Alcohol, Ethyl (B): <5 mg/dL (ref <5)

## 2015-06-13 LAB — ACETAMINOPHEN LEVEL: Acetaminophen (Tylenol), Serum: <10 ug/mL — ABNORMAL LOW (ref 10–30)

## 2015-06-13 LAB — AMMONIA: Ammonia: 50 umol/L — ABNORMAL HIGH (ref 9–35)

## 2015-06-13 MED ORDER — ZIPRASIDONE MESYLATE 20 MG IM SOLR
20.0000 mg | Freq: Once | INTRAMUSCULAR | Status: AC
  Administered 2015-06-13: 20 mg via INTRAMUSCULAR

## 2015-06-13 MED ORDER — SODIUM CHLORIDE 0.9 % IJ SOLN
3.0000 mL | Freq: Two times a day (BID) | INTRAMUSCULAR | Status: DC
  Administered 2015-06-14 – 2015-06-16 (×4): 3 mL via INTRAVENOUS

## 2015-06-13 MED ORDER — STERILE WATER FOR INJECTION IJ SOLN
INTRAMUSCULAR | Status: AC
  Administered 2015-06-13: 19:00:00
  Filled 2015-06-13: qty 10

## 2015-06-13 MED ORDER — HYDRALAZINE HCL 20 MG/ML IJ SOLN: 10.0000 mg | INTRAMUSCULAR | Status: DC | PRN

## 2015-06-13 MED ORDER — ONDANSETRON HCL 4 MG PO TABS: 4.0000 mg | ORAL_TABLET | Freq: Four times a day (QID) | ORAL | Status: DC | PRN

## 2015-06-13 MED ORDER — ALUM & MAG HYDROXIDE-SIMETH 200-200-20 MG/5ML PO SUSP: 30.0000 mL | ORAL | Status: DC | PRN

## 2015-06-13 MED ORDER — LORAZEPAM 2 MG/ML IJ SOLN
1.0000 mg | Freq: Once | INTRAMUSCULAR | Status: AC
  Administered 2015-06-13: 1 mg via INTRAVENOUS
  Filled 2015-06-13: qty 1

## 2015-06-13 MED ORDER — MAGNESIUM SULFATE IN D5W 10-5 MG/ML-% IV SOLN
1.0000 g | INTRAVENOUS | Status: AC
  Administered 2015-06-13: 1 g via INTRAVENOUS
  Filled 2015-06-13: qty 100

## 2015-06-13 MED ORDER — ONDANSETRON HCL 4 MG/2ML IJ SOLN: 4.0000 mg | Freq: Four times a day (QID) | INTRAMUSCULAR | Status: DC | PRN

## 2015-06-13 MED ORDER — LORAZEPAM 2 MG/ML IJ SOLN: 1.0000 mg | INTRAMUSCULAR | Status: DC | PRN

## 2015-06-13 MED ORDER — ACETAMINOPHEN 325 MG PO TABS: 650.0000 mg | ORAL_TABLET | Freq: Four times a day (QID) | ORAL | Status: DC | PRN

## 2015-06-13 MED ORDER — LORAZEPAM 1 MG PO TABS: 1.0000 mg | ORAL_TABLET | Freq: Three times a day (TID) | ORAL | Status: DC | PRN

## 2015-06-13 MED ORDER — ZIPRASIDONE MESYLATE 20 MG IM SOLR
INTRAMUSCULAR | Status: AC
  Administered 2015-06-13: 20 mg via INTRAMUSCULAR
  Filled 2015-06-13: qty 20

## 2015-06-13 MED ORDER — POTASSIUM CHLORIDE 10 MEQ/100ML IV SOLN
10.0000 meq | INTRAVENOUS | Status: DC
  Administered 2015-06-13: 10 meq via INTRAVENOUS
  Filled 2015-06-13 (×2): qty 100

## 2015-06-13 MED ORDER — ENOXAPARIN SODIUM 40 MG/0.4ML ~~LOC~~ SOLN
40.0000 mg | Freq: Every day | SUBCUTANEOUS | Status: DC
  Administered 2015-06-14 – 2015-06-15 (×3): 40 mg via SUBCUTANEOUS
  Filled 2015-06-13 (×3): qty 0.4

## 2015-06-13 MED ORDER — ONDANSETRON HCL 4 MG PO TABS
4.0000 mg | ORAL_TABLET | Freq: Three times a day (TID) | ORAL | Status: DC | PRN
Start: 2015-06-13 — End: 2015-06-13

## 2015-06-13 MED ORDER — ACETAMINOPHEN 650 MG RE SUPP: 650.0000 mg | Freq: Four times a day (QID) | RECTAL | Status: DC | PRN

## 2015-06-13 MED ORDER — POTASSIUM CHLORIDE 10 MEQ/100ML IV SOLN
10.0000 meq | INTRAVENOUS | Status: AC
  Administered 2015-06-13 – 2015-06-14 (×6): 10 meq via INTRAVENOUS
  Filled 2015-06-13 (×6): qty 100

## 2015-06-13 NOTE — ED Notes (Addendum)
Pt from home via EMS and [facility]-Per EMS, family called EMS reports manic behavior and sts that he doesn't know is pt has been taking meds. Pt has not been sleeping, was seen here yesterday with panic attack and dehydration. Pt received 5mg  Versed IM due to behavior. Pt in NAD. Pt did not voice SI/HI to EMS and [facility]

## 2015-06-13 NOTE — H&P (Signed)
[facility] History and Physical  [name] MVE:[id number] DOB: 1966/04/02 DOA: 06/13/2015  Referring physician: Mr. Cathi Roan. PCP: Arnette Norris, MD  Specialists: Patient's sister does not know patient's psychiatrist's name.  History obtained from patient's sister as patient is confused and agitated.  Chief Complaint: Agitation and confusion.  HPI: Paula Dickson is a 49 y.o. female history of bipolar disorder and hypertension who was brought to the ER after patient became increasingly agitated and confused. Patient was brought to the ER a day earlier for hallucination. But since patient was stable and was discharged home. Today again patient's family found the patient was very agitated and it was also not sure if patient has been taking her medications or not. Patient was given midazolam 5 mg IM by the EMS as per the ER reports. In the ER patient was given Geodon 20 mg intramuscularly spell which patient was still agitated. Patient's labs reveal severe hyponatremia and hypokalemia. EKG shows prolonged QT of 600 ms. As per patient's sister patient has been drinking a lot of fluid. Patient on my exam is agitated and does not contribute anything to the history. As per the family patient was not suicidal and has not overdosed with any medications. Patient did not have any nausea vomiting diarrhea or chest pain shortness of breath.   Review of Systems: As presented in the history of presenting illness, rest negative.  Past Medical History  Diagnosis Date  . History of arthritis   . History of chicken pox   . History of depression   . History of genital warts   . history of heart murmur   . History of high blood pressure   . History of thyroid disease   . History of UTI   . Bipolar affective disorder   . Hypertension    Past Surgical History  Procedure Laterality Date  . Cyst removal neck      around 11 years ago /benign  . Multiple tooth extractions    . Ablation on endometriosis      Social History:  reports that she has never smoked. She has never used smokeless tobacco. She reports that she does not drink alcohol or use illicit drugs. Where does patient live home. Can patient participate in ADLs? Yes.  No Known Allergies  Family History:  Family History  Problem Relation Age of Onset  . Alcohol abuse Paternal Uncle   . Alcohol abuse Paternal Grandfather   . Arthritis Father   . Hyperlipidemia Father   . High blood pressure Father   . Breast cancer Maternal Aunt   . Breast cancer Paternal Aunt   . High blood pressure Sister   . Diabetes Sister   . Diabetes Mother   . Mental illness Other     runs in family  . Diabetes Brother   . Mental illness Brother       Prior to Admission medications   Medication Sig Start Date End Date Taking? Authorizing Provider  hydrochlorothiazide (HYDRODIURIL) 25 MG tablet Take 1 tablet (25 mg total) by mouth daily. 03/19/15  Yes Lucille Passy, MD  lithium carbonate 150 MG capsule Take 1 capsule by mouth 3 (three) times daily. 03/17/14  Yes Historical Provider, MD  Multiple Vitamin (MULTIVITAMIN) capsule Take 1 capsule by mouth 2 (two) times daily.    Yes Historical Provider, MD  thioridazine (MELLARIL) 25 MG tablet Take 12.5-25 mg by mouth 3 (three) times daily. Take one tab by mouth in the morning, take one tablet by  mouth at noon, and one-half tab at bedtime 03/22/14  Yes Historical Provider, MD    Physical Exam: Filed Vitals:   06/13/15 1730 06/13/15 2033 06/13/15 2130 06/13/15 2210  BP: 155/81 170/96 135/107 146/80  Pulse: 88 118 95 78  Temp: 98.9 F (37.2 C)     TempSrc: Oral     Resp: 20 15 21 18   SpO2: 97% 99% 100% 97%     General:  Moderately built and nourished.  Eyes: Anicteric no pallor.  ENT: No discharge from the ears eyes nose and mouth.  Neck: No mass felt.  Cardiovascular: S1 and S2 heard.  Respiratory: No rhonchi or crepitations.  Abdomen: Soft nontender bowel sounds present.  Skin: No  rash.  Musculoskeletal: No edema.  Psychiatric: Patient is confused and agitated.  Neurologic: Alert awake oriented to her name only. Perla positive. Moves all extremities.  Labs on Admission:  Basic Metabolic Panel:  Recent Labs Lab 06/13/15 1920  NA 120*  K 2.1*  CL 80*  CO2 28  GLUCOSE 127*  BUN 9  CREATININE 1.07*  CALCIUM 9.7   Liver Function Tests: No results for input(s): AST, ALT, ALKPHOS, BILITOT, PROT, ALBUMIN in the last 168 hours. No results for input(s): LIPASE, AMYLASE in the last 168 hours. No results for input(s): AMMONIA in the last 168 hours. CBC:  Recent Labs Lab 06/13/15 1759  WBC 7.3  NEUTROABS 5.4  HGB 11.4*  HCT 31.8*  MCV 79.1  PLT 244   Cardiac Enzymes: No results for input(s): CKTOTAL, CKMB, CKMBINDEX, TROPONINI in the last 168 hours.  BNP (last 3 results) No results for input(s): BNP in the last 8760 hours.  ProBNP (last 3 results) No results for input(s): PROBNP in the last 8760 hours.  CBG: No results for input(s): GLUCAP in the last 168 hours.  Radiological Exams on Admission: No results found.  EKG: Independently reviewed. Normal sinus rhythm with prolonged QT of 600 ms.  Assessment/Plan Principal Problem:   Acute psychosis Active Problems:   Hypokalemia   Hyponatremia   Hypertension, uncontrolled   1. Acute psychosis with history of bipolar disorder - patient has already received 5 mg midazolam by the EMS and Geodon 20 mg IM in the ER. Since patient has prolonged QT at this time I'm hesitant to keep patient on any anti-psychotics until patient's electrolytes are corrected and recheck EKG. I'm holding off patient's present anti-psychiatric medications. I have ordered Ativan 1 mg IV and every 4 hourly when necessary for agitation. Closely observe in stepdown. In addition we will also check ammonia levels and TSH. Consult psychiatry name. 2. Severe hyponatremia - most likely secondary to polydipsia. Check urine sodium  osmolality serum osmolality, cortisol and TSH levels. At this time I have placed patient on fluid restriction of 800 mL per day. Closely follow metabolic panel every 4 hourly. Patient is also on HCTZ which should be discontinued given the hyponatremia. Patient is also on thioridazine which can cause hyponatremia. 3. Severe hypokalemia - probably from diverticula takes and poor oral intake. Replace and recheck. Check magnesium levels. 4. Hypertension - patient has been placed on when necessary IV hydralazine for now. Closely follow blood pressure trends. May try to avoid hydrochlorothiazide secondary to hyponatremia. 5. Prolonged QT - correct electrolytes and recheck EKG.  I have reviewed patient's old charts. Postsurgical patient's chest x-ray.   DVT Prophylaxis Lovenox.  Code Status: Full code.  Family Communication: Discussed patient's sister.  Disposition Plan: Admit to inpatient.    Gean Birchwood  NMarland Kitchen [facility] Pager [phone].  If 7PM-7AM, please contact night-coverage [email or web address] Password [facility] 06/13/2015, 10:13 PM

## 2015-06-13 NOTE — ED Notes (Signed)
Bed: RESB Expected date: 06/13/15 Expected time: 5:24 PM Means of arrival: Ambulance Comments: Medical clearance- Versed given
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
## 2015-06-13 NOTE — BH Assessment (Addendum)
Tele Assessment Note   Paula Dickson is an 49 y.o. female presenting to [facility] due to manic behaviors. Pt stated "I need help, I slander Jesus name and everybody's name". "I am hurting the family because I won't forgive or be quiet". "I'm not using my words correctly but I know what I'm trying to say". "I'm ungrateful, I just killed the image of [place]". Pt is endorsing suicidal ideations but denies having an active plan. Pt reported that she attempted suicide in the past and has been hospitalized several times. Pt reported that she  is currently receiving mental health treatment through [facility] and [place]. Pt reported that she has not been taking her medication as prescribed and shared that  today she took pills at once instead of the one in the morning and the other later in the day. Pt was unable to recall the name of that medication at this time. Pt denies HI and AVH at this time. Pt did report any alcohol or illicit substance abuse at this time. Pt denied having access to weapons or firearms. Pt did not report any pending criminal charges or upcoming court dates. Pt did not report any physical, sexual or emotional abuse at this time.   Axis I: Bipolar, Manic  Past Medical History:  Past Medical History  Diagnosis Date  . History of arthritis   . History of chicken pox   . History of depression   . History of genital warts   . history of heart murmur   . History of high blood pressure   . History of thyroid disease   . History of UTI   . Bipolar affective disorder   . Hypertension     Past Surgical History  Procedure Laterality Date  . Cyst removal neck      around 11 years ago /benign  . Multiple tooth extractions    . Ablation on endometriosis      Family History:  Family History  Problem Relation Age of Onset  . Alcohol abuse Paternal Uncle   . Alcohol abuse Paternal Grandfather   . Arthritis Father   . Hyperlipidemia Father   . High blood pressure Father   .  Breast cancer Maternal Aunt   . Breast cancer Paternal Aunt   . High blood pressure Sister   . Diabetes Sister   . Diabetes Mother   . Mental illness Other     runs in family  . Diabetes Brother   . Mental illness Brother     Social History:  reports that she has never smoked. She has never used smokeless tobacco. She reports that she does not drink alcohol or use illicit drugs.  Additional Social History:  Alcohol / Drug Use History of alcohol / drug use?: No history of alcohol / drug abuse  CIWA: CIWA-Ar BP: 155/81 mmHg Pulse Rate: 88 COWS:    PATIENT STRENGTHS: (choose at least two) Average or above average intelligence Supportive family/friends  Allergies: No Known Allergies  Home Medications:  (Not in a hospital admission)  OB/GYN Status:  No LMP recorded. Patient is not currently having periods (Reason: Perimenopausal).  General Assessment Data Location of Assessment: [facility] ED TTS Assessment: In system Is this a Tele or Face-to-Face Assessment?: Face-to-Face Is this an Initial Assessment or a Re-assessment for this encounter?: Initial Assessment Marital status: Single Living Arrangements: Parent Can pt return to current living arrangement?: Yes Admission Status: Voluntary Is patient capable of signing voluntary admission?: Yes Referral Source: Self/Family/Friend Insurance  type: [employer] Living Arrangements: Parent Name of Psychiatrist: Warden/ranger  Name of Therapist: Warden/ranger   Education Status Is patient currently in school?: No Current Grade: N/A Highest grade of school patient has completed: N/A Name of school: N/A Contact person: N/A  Risk to self with the past 6 months Suicidal Ideation: Yes-Currently Present Has patient been a risk to self within the past 6 months prior to admission? : No Suicidal Intent: No Has patient had any suicidal intent within the past 6 months prior to admission? : No Is patient at risk for  suicide?: No Suicidal Plan?: No Has patient had any suicidal plan within the past 6 months prior to admission? : No Access to Means: No What has been your use of drugs/alcohol within the last 12 months?: No alcohol or drug use reported.  Previous Attempts/Gestures: Yes How many times?: 1 Other Self Harm Risks: No other self harm risk identified at this time.  Triggers for Past Attempts: Unpredictable Intentional Self Injurious Behavior: None Family Suicide History: Yes (Cousin-Deanna ) Recent stressful life event(s): Other (Comment) ("not taking medication as prescribed" ) Persecutory voices/beliefs?: No Depression: No Depression Symptoms: Insomnia Substance abuse history and/or treatment for substance abuse?: No Suicide prevention information given to non-admitted patients: Not applicable  Risk to Others within the past 6 months Homicidal Ideation: No Does patient have any lifetime risk of violence toward others beyond the six months prior to admission? : No Thoughts of Harm to Others: No Current Homicidal Intent: No Current Homicidal Plan: No Access to Homicidal Means: No Identified Victim: N/A History of harm to others?: No Assessment of Violence: On admission Violent Behavior Description: No violent behaviors observed. Pt is calm and cooperative at this time.  Does patient have access to weapons?: No Criminal Charges Pending?: No Does patient have a court date: No Is patient on probation?: No  Psychosis Hallucinations: None noted Delusions: None noted  Mental Status Report Appearance/Hygiene: In hospital gown Eye Contact: Good Motor Activity: Unable to assess Speech: Logical/coherent Level of Consciousness: Quiet/awake Mood: Pleasant Affect: Appropriate to circumstance Anxiety Level: Minimal Thought Processes: Circumstantial Judgement: Unimpaired Orientation: Appropriate for developmental age Obsessive Compulsive Thoughts/Behaviors: Minimal  Cognitive  Functioning Concentration: Fair Memory: Recent Intact IQ: Average Insight: Fair Impulse Control: Fair Appetite: Good Weight Loss: 0 Weight Gain: 0 Sleep: Decreased Total Hours of Sleep:  (Unknown) Vegetative Symptoms: None  ADLScreening [facility] Assessment Services) Patient's cognitive ability adequate to safely complete daily activities?: Yes Patient able to express need for assistance with ADLs?: Yes Independently performs ADLs?: Yes (appropriate for developmental age)  Prior Inpatient Therapy Prior Inpatient Therapy: Yes Prior Therapy Dates:  (Dates unknown ) Prior Therapy Facilty/Provider(s): [facility]  Reason for Treatment: SI, non-compliance with medication  Prior Outpatient Therapy Prior Outpatient Therapy: Yes Prior Therapy Dates: Current  Prior Therapy Facilty/Provider(s): [name]  Reason for Treatment: Bipolar  Does patient have an ACCT team?: Unknown Does patient have Intensive In-House Services?  : No Does patient have [name] services? : Yes Does patient have [facility] services?: Unknown  ADL Screening (condition at time of admission) Patient's cognitive ability adequate to safely complete daily activities?: Yes Is the patient deaf or have difficulty hearing?: No Does the patient have difficulty seeing, even when wearing glasses/contacts?: No Does the patient have difficulty concentrating, remembering, or making decisions?: No Patient able to express need for assistance with ADLs?: Yes Does the patient have difficulty dressing or bathing?: No  Independently performs ADLs?: Yes (appropriate for developmental age)       Abuse/Neglect Assessment (Assessment to be complete while patient is alone) Physical Abuse: Denies Verbal Abuse: Denies Sexual Abuse: Denies Exploitation of patient/patient's resources: Denies Self-Neglect: Denies     Regulatory affairs officer (For Healthcare) Does patient have an advance directive?: No Would patient like  information on creating an advanced directive?: No - patient declined information    Additional Information 1:1 In Past 12 Months?: No CIRT Risk: No Elopement Risk: No Does patient have medical clearance?: Yes     Disposition: Inpatient treatment Disposition Initial Assessment Completed for this Encounter: Yes  Paula Dickson S 06/13/2015 8:26 PM

## 2015-06-13 NOTE — ED Notes (Signed)
Pt yelling out, screaming at staff - pt's thoughts are not coherent, pt w/ flight of ideas - Irena Cords, PAC at bedside for reassessment.

## 2015-06-13 NOTE — BH Assessment (Signed)
Assessment completed. Consulted Arlester Marker, NP who recommended inpatient treatment. Irena Cords, PA-C has been informed of the recommendation. Pt will be admitted medically due to her potassium. TTS will seek placement once pt is medically cleared.

## 2015-06-14 ENCOUNTER — Encounter (HOSPITAL_COMMUNITY): Payer: Self-pay

## 2015-06-14 DIAGNOSIS — G9340 Encephalopathy, unspecified: Secondary | ICD-10-CM

## 2015-06-14 DIAGNOSIS — F29 Unspecified psychosis not due to a substance or known physiological condition: Principal | ICD-10-CM

## 2015-06-14 DIAGNOSIS — F312 Bipolar disorder, current episode manic severe with psychotic features: Secondary | ICD-10-CM

## 2015-06-14 DIAGNOSIS — E871 Hypo-osmolality and hyponatremia: Secondary | ICD-10-CM

## 2015-06-14 DIAGNOSIS — E876 Hypokalemia: Secondary | ICD-10-CM

## 2015-06-14 LAB — HEPATIC FUNCTION PANEL
ALT: 26 U/L (ref 14–54)
AST: 38 U/L (ref 15–41)
Albumin: 3.8 g/dL (ref 3.5–5.0)
Alkaline Phosphatase: 37 U/L — ABNORMAL LOW (ref 38–126)
Bilirubin, Direct: 0.2 mg/dL (ref 0.1–0.5)
Indirect Bilirubin: 0.5 mg/dL (ref 0.3–0.9)
Total Bilirubin: 0.7 mg/dL (ref 0.3–1.2)
Total Protein: 6.3 g/dL — ABNORMAL LOW (ref 6.5–8.1)

## 2015-06-14 LAB — BASIC METABOLIC PANEL
Anion gap: 10 (ref 5–15)
Anion gap: 7 (ref 5–15)
Anion gap: 7 (ref 5–15)
BUN: 8 mg/dL (ref 6–20)
BUN: 8 mg/dL (ref 6–20)
BUN: 8 mg/dL (ref 6–20)
CO2: 27 mmol/L (ref 22–32)
CO2: 29 mmol/L (ref 22–32)
CO2: 31 mmol/L (ref 22–32)
Calcium: 8.7 mg/dL — ABNORMAL LOW (ref 8.9–10.3)
Calcium: 9.3 mg/dL (ref 8.9–10.3)
Calcium: 9.8 mg/dL (ref 8.9–10.3)
Chloride: 84 mmol/L — ABNORMAL LOW (ref 101–111)
Chloride: 92 mmol/L — ABNORMAL LOW (ref 101–111)
Chloride: 95 mmol/L — ABNORMAL LOW (ref 101–111)
Creatinine, Ser: 0.95 mg/dL (ref 0.44–1.00)
Creatinine, Ser: 0.98 mg/dL (ref 0.44–1.00)
Creatinine, Ser: 1.03 mg/dL — ABNORMAL HIGH (ref 0.44–1.00)
GFR calc Af Amer: >60 mL/min (ref >60)
GFR calc Af Amer: >60 mL/min (ref >60)
GFR calc Af Amer: >60 mL/min (ref >60)
GFR calc non Af Amer: >60 mL/min (ref >60)
GFR calc non Af Amer: >60 mL/min (ref >60)
GFR calc non Af Amer: >60 mL/min (ref >60)
Glucose, Bld: 131 mg/dL — ABNORMAL HIGH (ref 65–99)
Glucose, Bld: 115 mg/dL — ABNORMAL HIGH (ref 65–99)
Glucose, Bld: 123 mg/dL — ABNORMAL HIGH (ref 65–99)
Potassium: 2 mmol/L — CL (ref 3.5–5.1)
Potassium: 2.7 mmol/L — CL (ref 3.5–5.1)
Potassium: 2.9 mmol/L — ABNORMAL LOW (ref 3.5–5.1)
Sodium: 121 mmol/L — ABNORMAL LOW (ref 135–145)
Sodium: 128 mmol/L — ABNORMAL LOW (ref 135–145)
Sodium: 133 mmol/L — ABNORMAL LOW (ref 135–145)

## 2015-06-14 LAB — CBC WITH DIFFERENTIAL/PLATELET
Basophils Absolute: 0 10*3/uL (ref 0.0–0.1)
Basophils Relative: 0 % (ref 0–1)
Eosinophils Absolute: 0 10*3/uL (ref 0.0–0.7)
Eosinophils Relative: 0 % (ref 0–5)
HCT: 35.5 % — ABNORMAL LOW (ref 36.0–46.0)
Hemoglobin: 12.5 g/dL (ref 12.0–15.0)
Lymphocytes Relative: 23 % (ref 12–46)
Lymphs Abs: 1.8 10*3/uL (ref 0.7–4.0)
MCH: 28 pg (ref 26.0–34.0)
MCHC: 35.2 g/dL (ref 30.0–36.0)
MCV: 79.6 fL (ref 78.0–100.0)
Monocytes Absolute: 0.6 10*3/uL (ref 0.1–1.0)
Monocytes Relative: 8 % (ref 3–12)
Neutro Abs: 5.2 10*3/uL (ref 1.7–7.7)
Neutrophils Relative %: 69 % (ref 43–77)
Platelets: 238 10*3/uL (ref 150–400)
RBC: 4.46 MIL/uL (ref 3.87–5.11)
RDW: 12.4 % (ref 11.5–15.5)
WBC: 7.6 10*3/uL (ref 4.0–10.5)

## 2015-06-14 LAB — URINALYSIS, ROUTINE W REFLEX MICROSCOPIC
Bilirubin Urine: NEGATIVE
Glucose, UA: NEGATIVE mg/dL
Hgb urine dipstick: NEGATIVE
Ketones, ur: NEGATIVE mg/dL
Leukocytes, UA: NEGATIVE
Nitrite: NEGATIVE
Protein, ur: NEGATIVE mg/dL
Specific Gravity, Urine: 1.003 — ABNORMAL LOW (ref 1.005–1.030)
Urobilinogen, UA: 0.2 mg/dL (ref 0.0–1.0)
pH: 7 (ref 5.0–8.0)

## 2015-06-14 LAB — TSH: TSH: 0.245 u[IU]/mL — ABNORMAL LOW (ref 0.350–4.500)

## 2015-06-14 LAB — OSMOLALITY, URINE: Osmolality, Ur: 117 mOsm/kg — ABNORMAL LOW (ref 390–1090)

## 2015-06-14 LAB — SODIUM, URINE, RANDOM: Sodium, Ur: 23 mmol/L — ABNORMAL LOW (ref 28–272)

## 2015-06-14 LAB — OSMOLALITY: Osmolality: 251 mOsm/kg — ABNORMAL LOW (ref 275–300)

## 2015-06-14 LAB — CORTISOL: Cortisol, Plasma: 22.5 ug/dL

## 2015-06-14 LAB — MRSA PCR SCREENING: MRSA by PCR: NEGATIVE

## 2015-06-14 LAB — MAGNESIUM: Magnesium: 2.1 mg/dL (ref 1.7–2.4)

## 2015-06-14 MED ORDER — DIVALPROEX SODIUM 250 MG PO DR TAB
500.0000 mg | DELAYED_RELEASE_TABLET | Freq: Two times a day (BID) | ORAL | Status: DC
  Administered 2015-06-14 – 2015-06-16 (×4): 500 mg via ORAL
  Filled 2015-06-14 (×5): qty 2

## 2015-06-14 MED ORDER — POTASSIUM CHLORIDE CRYS ER 20 MEQ PO TBCR
40.0000 meq | EXTENDED_RELEASE_TABLET | Freq: Once | ORAL | Status: AC
  Administered 2015-06-14: 40 meq via ORAL
  Filled 2015-06-14: qty 2

## 2015-06-14 MED ORDER — CLONAZEPAM 0.5 MG PO TABS
0.5000 mg | ORAL_TABLET | Freq: Two times a day (BID) | ORAL | Status: DC
  Administered 2015-06-14 – 2015-06-16 (×4): 0.5 mg via ORAL
  Filled 2015-06-14 (×5): qty 1

## 2015-06-14 NOTE — Progress Notes (Addendum)
Patient ID: Paula Dickson, female   DOB: 1966/11/15, 49 y.o.   MRN: [id number] [facility] PROGRESS NOTE  Kegan Shepardson [id number] DOB: [birth year] DOA: 07-03-15 PCP: Arnette Norris, MD  Brief narrative:    49 y.o. female with past medical history of bipolar disorder, psychosis, hypertension who presented to [facility] with agitation and confusion and apparently hallucinations which started one day prior to this admission. Patient was given Versed 5 mg IM by EMS and then Geodon 20 mg IM once she arrived to ER and she was still agitated. Patient was hemodynamically stable on the admission. Her 12-lead EKG showed QT prolongation of 600 ms. Her blood work was significant for hemoglobin of 11.4, sodium of 120, potassium of 2.1 and creatinine of 1.07. She was admitted to stepdown unit for first 24 hour observation.  Barrier to discharge: Patient is still hypokalemic and requires potassium supplementation. We will transfer her to telemetry floor today. Psychiatry consult requested.   Assessment/Plan:    Principal Problem: Acute encephalopathy  / delirium  / Acute psychosis - Patient with altered mental status and known history of bipolar disorder and psychosis - Unclear if she is compliant with her medications. Her medications include Thioridazine and Lithium  - Psychiatric medications on hold until patient is evaluated by psychiatry. - Lithium level WNL - Mental status is low but better this morning.  Active Problems: Hypokalemia - Likely from thiazide diuretic or Lithium - Supplemented  Hyponatremia - Likely from lithium and/or HCTZ - Sodium slowly improving,  Essential hypertension - HCTZ on hold. - Blood pressure at goal    DVT Prophylaxis   - Lovenox subcutaneous ordered   Code Status: Full.  Family Communication:  plan of care discussed with the patient Disposition Plan: Transfer to telemetry today.  IV access:  Peripheral IV  Procedures and diagnostic  studies:    Dg Chest Port 1 View July 03, 2015   Lungs hypoexpanded, with underlying vascular congestion. No focal airspace consolidation seen.    Medical Consultants:  Psychiatry  Other Consultants:  None   IAnti-Infectives:   None    Leisa Lenz, MD  [facility] Pager [phone]  Time spent in minutes: 25 minutes  If 7PM-7AM, please contact night-coverage [email or web address] Password [facility] 06/14/2015, 9:47 AM   LOS: 1 day    HPI/Subjective: No acute overnight events. Patient reports mild headache.   Objective: Filed Vitals:   06/14/15 0149 06/14/15 0409 06/14/15 0535 06/14/15 0800  BP: 155/90 159/87  132/62  Pulse: 81 78  76  Temp:   98.7 F (37.1 C) 98.1 F (36.7 C)  TempSrc:    Oral  Resp: 16 20  24   Height:      Weight:      SpO2: 100% 98%  98%    Intake/Output Summary (Last 24 hours) at 06/14/15 0947 Last data filed at 06/14/15 2952  Gross per 24 hour  Intake    660 ml  Output   3600 ml  Net  -2940 ml    Exam:   General:  Pt is alert, not in acute distress  Cardiovascular: Regular rate and rhythm, S1/S2, no murmurs  Respiratory: Clear to auscultation bilaterally, no wheezing, no crackles, no rhonchi  Abdomen: Soft, non tender, non distended, bowel sounds present  Extremities: No edema, pulses DP and PT palpable bilaterally  Neuro: Grossly nonfocal  Data Reviewed: Basic Metabolic Panel:  Recent Labs Lab July 03, 2015 1920 03-Jul-2015 2250 06/14/15 0630  NA 120* 121* 128*  K 2.1* 2.0* 2.7*  CL 80* 84* 92*  CO2 28 27 29   GLUCOSE 127* 131* 115*  BUN 9 8 8   CREATININE 1.07* 0.95 0.98  CALCIUM 9.7 8.7* 9.3  MG  --  2.1  --    Liver Function Tests:  Recent Labs Lab 06/13/15 2250  AST 38  ALT 26  ALKPHOS 37*  BILITOT 0.7  PROT 6.3*  ALBUMIN 3.8   No results for input(s): LIPASE, AMYLASE in the last 168 hours.  Recent Labs Lab 06/13/15 2250  AMMONIA 50*   CBC:  Recent Labs Lab 06/13/15 1759 06/13/15 2250 06/14/15 0630  WBC  7.3 7.6 7.6  NEUTROABS 5.4  --  5.2  HGB 11.4* 11.8* 12.5  HCT 31.8* 32.8* 35.5*  MCV 79.1 79.0 79.6  PLT 244 240 238   Cardiac Enzymes: No results for input(s): CKTOTAL, CKMB, CKMBINDEX, TROPONINI in the last 168 hours. BNP: Invalid input(s): POCBNP CBG: No results for input(s): GLUCAP in the last 168 hours.  Recent Results (from the past 240 hour(s))  MRSA PCR Screening     Status: None   Collection Time: 06/13/15 10:28 PM  Result Value Ref Range Status   MRSA by PCR NEGATIVE NEGATIVE Final     Scheduled Meds: . enoxaparin (LOVENOX) injection  40 mg Subcutaneous QHS  . potassium chloride  40 mEq Oral Once  . sodium chloride  3 mL Intravenous Q12H

## 2015-06-14 NOTE — ED Provider Notes (Signed)
CSN: [id number]     Arrival date & time 06/13/15  1725 History   First MD Initiated Contact with Patient 06/13/15 1729     Chief Complaint  Patient presents with  . Manic Behavior     (Consider location/radiation/quality/duration/timing/severity/associated sxs/prior Treatment) HPI Patient presents to the emergency department with a manic episode and was given Versed by EMS because she was screaming and yelling and thrashing about the patient has a history of mania and psychosis.  Patient is unable to answer any questions for me.  She was given the Versed EMS that the family felt that she was out of control and that is why EMS was called Past Medical History  Diagnosis Date  . History of arthritis   . History of chicken pox   . History of depression   . History of genital warts   . history of heart murmur   . History of high blood pressure   . History of thyroid disease   . History of UTI   . Bipolar affective disorder   . Hypertension    Past Surgical History  Procedure Laterality Date  . Cyst removal neck      around 11 years ago /benign  . Multiple tooth extractions    . Ablation on endometriosis     Family History  Problem Relation Age of Onset  . Alcohol abuse Paternal Uncle   . Alcohol abuse Paternal Grandfather   . Arthritis Father   . Hyperlipidemia Father   . High blood pressure Father   . Breast cancer Maternal Aunt   . Breast cancer Paternal Aunt   . High blood pressure Sister   . Diabetes Sister   . Diabetes Mother   . Mental illness Other     runs in family  . Diabetes Brother   . Mental illness Brother    History  Substance Use Topics  . Smoking status: Never Smoker   . Smokeless tobacco: Never Used  . Alcohol Use: No   OB History    No data available     Review of Systems  Level V caveat applies due to altered mental status  Allergies  Review of patient's allergies indicates no known allergies.  Home Medications   Prior to Admission  medications   Medication Sig Start Date End Date Taking? Authorizing Provider  hydrochlorothiazide (HYDRODIURIL) 25 MG tablet Take 1 tablet (25 mg total) by mouth daily. 03/19/15  Yes Lucille Passy, MD  lithium carbonate 150 MG capsule Take 1 capsule by mouth 3 (three) times daily. 03/17/14  Yes Historical Provider, MD  Multiple Vitamin (MULTIVITAMIN) capsule Take 1 capsule by mouth 2 (two) times daily.    Yes Historical Provider, MD  thioridazine (MELLARIL) 25 MG tablet Take 12.5-25 mg by mouth 3 (three) times daily. Take one tab by mouth in the morning, take one tablet by mouth at noon, and one-half tab at bedtime 03/22/14  Yes Historical Provider, MD   BP 155/90 mmHg  Pulse 81  Temp(Src) 99.2 F (37.3 C) (Axillary)  Resp 16  Ht 5\' 1"  (1.549 m)  Wt 167 lb 8.8 oz (76 kg)  BMI 31.67 kg/m2  SpO2 100% Physical Exam  Constitutional: She is oriented to person, place, and time. She appears well-developed and well-nourished. No distress.  HENT:  Head: Normocephalic and atraumatic.  Eyes:    Neck: Normal range of motion. Neck supple.  Cardiovascular: Normal rate, regular rhythm and normal heart sounds.  Exam reveals no gallop and  no friction rub.   No murmur heard. Pulmonary/Chest: Effort normal and breath sounds normal.  Neurological: She is alert and oriented to person, place, and time. She exhibits normal muscle tone. Coordination normal.  Skin: Skin is warm and dry. No rash noted. No erythema.  Psychiatric: Her speech is rapid and/or pressured. She is agitated and hyperactive.  The patient eventually woke up from the Versed and became very agitated and screaming out and thrashing about.  She was given Geodon  Nursing note and vitals reviewed.   ED Course  Procedures (including critical care time) Labs Review Labs Reviewed  CBC WITH DIFFERENTIAL/PLATELET - Abnormal; Notable for the following:    Hemoglobin 11.4 (*)    HCT 31.8 (*)    All other components within normal limits  URINE  RAPID DRUG SCREEN, HOSP PERFORMED - Abnormal; Notable for the following:    Benzodiazepines POSITIVE (*)    All other components within normal limits  LITHIUM LEVEL - Abnormal; Notable for the following:    Lithium Lvl 0.52 (*)    All other components within normal limits  BASIC METABOLIC PANEL - Abnormal; Notable for the following:    Sodium 120 (*)    Potassium 2.1 (*)    Chloride 80 (*)    Glucose, Bld 127 (*)    Creatinine, Ser 1.07 (*)    GFR calc non Af Amer 59 (*)    All other components within normal limits  SODIUM, URINE, RANDOM - Abnormal; Notable for the following:    Sodium, Ur 23 (*)    All other components within normal limits  OSMOLALITY, URINE - Abnormal; Notable for the following:    Osmolality, Ur 117 (*)    All other components within normal limits  BASIC METABOLIC PANEL - Abnormal; Notable for the following:    Sodium 121 (*)    Potassium 2.0 (*)    Chloride 84 (*)    Glucose, Bld 131 (*)    Calcium 8.7 (*)    All other components within normal limits  AMMONIA - Abnormal; Notable for the following:    Ammonia 50 (*)    All other components within normal limits  URINALYSIS, ROUTINE W REFLEX MICROSCOPIC (NOT AT [facility]) - Abnormal; Notable for the following:    Specific Gravity, Urine 1.003 (*)    All other components within normal limits  TSH - Abnormal; Notable for the following:    TSH 0.245 (*)    All other components within normal limits  OSMOLALITY - Abnormal; Notable for the following:    Osmolality 251 (*)    All other components within normal limits  ACETAMINOPHEN LEVEL - Abnormal; Notable for the following:    Acetaminophen (Tylenol), Serum <10 (*)    All other components within normal limits  CBC - Abnormal; Notable for the following:    Hemoglobin 11.8 (*)    HCT 32.8 (*)    All other components within normal limits  MRSA PCR SCREENING  ETHANOL  SALICYLATE LEVEL  MAGNESIUM  BASIC METABOLIC PANEL  BASIC METABOLIC PANEL  BASIC METABOLIC  PANEL  HEPATIC FUNCTION PANEL  CORTISOL  BASIC METABOLIC PANEL  CBC  CBC WITH DIFFERENTIAL/PLATELET    Imaging Review Dg Chest Port 1 View  06/13/2015   CLINICAL DATA:  Acute onset of manic behavior.  Initial encounter.  EXAM: PORTABLE CHEST - 1 VIEW  COMPARISON:  None.  FINDINGS: The lungs are hypoexpanded. Vascular crowding and vascular congestion are noted. There is no evidence of  focal opacification, pleural effusion or pneumothorax.  The cardiomediastinal silhouette is within normal limits. No acute osseous abnormalities are seen.  IMPRESSION: Lungs hypoexpanded, with underlying vascular congestion. No focal airspace consolidation seen.   Electronically Signed   By: Garald Balding M.D.   On: 06/13/2015 22:53    Patient was admitted to the hospital for a potassium at 2.1, and a sodium of 120.  Patient, otherwise need psychiatric evaluation  Dalia Heading, PA-C 06/15/15 2081  Dorie Rank, MD 06/18/15 [phone]

## 2015-06-14 NOTE — Clinical Social Work Psych Assess (Signed)
Clinical Social Work Nature conservation officer  Clinical Social Worker:  Boone Master, Blaine Date/Time:  06/14/2015, 2:22 PM Referred By:  Physician Date Referred:  06/14/15 Reason for Referral:  Behavioral Health Issues   Presenting Symptoms/Problems  Presenting Symptoms/Problems(in person's/family's own words):  Psych consulted due to AMS and bipolar disorder.   Abuse/Neglect/Trauma History  Abuse/Neglect/Trauma History:  Denies History Abuse/Neglect/Trauma History Comments (indicate dates):  N/A   Psychiatric History  Psychiatric History:  Inpatient/Hospitalization, Outpatient Treatment Psychiatric Medication:  Lithium   Current Mental Health Hospitalizations/Previous Mental Health History:  Patient reports she was diagnosed with bipolar disorder in the past. Patient currently receiving medication management and therapy.   Current Provider:  [name] and Date:  [place], [place]  Current Medications:   Scheduled Meds: . enoxaparin (LOVENOX) injection  40 mg Subcutaneous QHS  . sodium chloride  3 mL Intravenous Q12H   Continuous Infusions:  PRN Meds:.acetaminophen **OR** acetaminophen, hydrALAZINE, LORazepam, ondansetron **OR** ondansetron (ZOFRAN) IV     Previous Inpatient Admission/Date/Reason:  None reported   Emotional Health/Current Symptoms  Suicide/Self Harm: None Reported Suicide Attempt in Past (date/description):  N/A  Other Harmful Behavior (ex. homicidal ideation) (describe):  None reported   Psychotic/Dissociative Symptoms  Psychotic/Dissociative Symptoms: None Reported Other Psychotic/Dissociative Symptoms:  N/A   Attention/Behavioral Symptoms  Attention/Behavioral Symptoms: Within Normal Limits Other Attention/Behavioral Symptoms:  Patient engaged during assessment.   Cognitive Impairment  Cognitive Impairment:  Within Normal Limits Other Cognitive  Impairment:  Patient alert and oriented.   Mood and Adjustment  Mood and Adjustment:  Mood Congruent   Stress, Anxiety, Trauma, Any Recent Loss/Stressor  Stress, Anxiety, Trauma, Any Recent Loss/Stressor: None Reported Anxiety (frequency):  N/A  Phobia (specify):  N/A  Compulsive Behavior (specify):  N/A  Obsessive Behavior (specify): N/A  Other Stress, Anxiety, Trauma, Any Recent Loss/Stressor:  Patient reports a significant stressor but is not able to speak about it right now. Patient reports she is working on problems during therapy.   Substance Abuse/Use  Substance Abuse/Use: None SBIRT Completed (please refer for detailed history): No Self-reported Substance Use (last use and frequency):  Patient denies any substance use.  Urinary Drug Screen Completed: Yes Alcohol Level:  <5   Environment/Housing/Living Arrangement  Environmental/Housing/Living Arrangement: Stable Housing Who is in the Home:  Father  Emergency Contact:  Firefighter  Financial: Medicare   Patient's Strengths and Goals  Patient's Strengths and Goals (patient's own words):  Patient has supportive family. Patient is compliant with treatment and medications.   Clinical Social Worker's Interpretive Summary  Clinical Social Workers Interpretive Summary:    CSW received referral to complete psychosocial assessment. CSW reviewed chart and met with patient at bedside with psych MD. Patient reports that she was admitted due to abnormal labs.  Patient came to ED on Saturday but was DC back home. Patient's family came to visit on Sunday and patient reports she was unable to manage her mood and emotions and was yelling at her family. Patient came to the hospital and was admitted due to abnormal lab work. Patient reports that she feels better today and was told that Lithium levels were not therapeutic on admission. Patient reports she has taken Lithium for several years and has not  experienced problems in the past.   Patient currently goes to [facility] for medication management and to [facility] for counseling. Patient feels that her mental status has improved and that she  does not require hospitalization. Patient plans to continue to follow up on outpatient basis at [facility].  Patient lives at home with father and reports supportive family. 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  Psych MD to make medication recommendations. CSW will continue to follow.   Disposition  Disposition: Outpatient Referral Made/Needed         Sindy Messing, LCSW [phone]

## 2015-06-14 NOTE — Consult Note (Signed)
[facility]iat[facility] Consult   Reason for Consult:  Bipolar mania and non compliance with medications Referring Physician:  DR. Charlies Silvers Patient Identification: Paula Dickson MRN:  [id number] Principal Diagnosis: Bipolar disorder with severe mania Diagnosis:   Patient Active Problem List   Diagnosis Date Noted  . Bipolar disorder with severe mania [F31.13] 06/14/2015  . Hypokalemia [E87.6] 06/13/2015  . Acute psychosis [F29] 06/13/2015  . Hyponatremia [E87.1] 06/13/2015  . Hypertension, uncontrolled [I10] 06/13/2015  . Breast pain in female [N64.4] 08/27/2014  . Encounter for completion of form with patient [Z02.89] 07/08/2014  . HTN (hypertension) [I10] 04/02/2014  . Routine general medical examination at a health care facility [Z00.00] 04/02/2014  . Encounter for routine gynecological examination [Z01.419] 04/02/2014  . Manic depression [F31.9] 04/02/2014    Total Time spent with patient: 1 hour  Subjective:   Paula Dickson is a 49 y.o. female patient admitted with bipolar mania  HPI:  Paula Dickson is a 49 y.o. female seen along with psychiatric clinical social work service and chart reviewed for psychiatric consultation and evaluation of bipolar disorder with recent symptoms of mania, agitation, screaming and yelling, confused. Review of labs indicated a electrolyte abnormalities, decreased TSH also reportedly increased oral fluids. Patient also has been stressed about her relationship or affair. 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 Patient also reportedly taking her medication lithium and Mellaril over several years. Patient is willing to change her medication at this time. Patient has extremely high anxiety, pressured speech but no agitation or aggressive behavior during this visit. We will stay away from and the psychotic medication due to prolonged QT elevation. Will  start Depakote and clonazepam for controlling bipolar mania and anxiety. Patient has no suicide/homicide ideation, intention or plans. Patient has no auditory or visual hallucinations, delusions or paranoia. Patient did not have any nausea vomiting diarrhea or chest pain shortness of breath.   HPI Elements:   Location:  Bipolar mania and confusion. Quality:  Poor. Severity:  Confusion hallucination agitation and anxiety. Timing:  Unknown. Duration:  Few days. Context:  Psychosocial stresses.  Past Medical History:  Past Medical History  Diagnosis Date  . History of arthritis   . History of chicken pox   . History of depression   . History of genital warts   . history of heart murmur   . History of high blood pressure   . History of thyroid disease   . History of UTI   . Bipolar affective disorder   . Hypertension     Past Surgical History  Procedure Laterality Date  . Cyst removal neck      around 11 years ago /benign  . Multiple tooth extractions    . Ablation on endometriosis     Family History:  Family History  Problem Relation Age of Onset  . Alcohol abuse Paternal Uncle   . Alcohol abuse Paternal Grandfather   . Arthritis Father   . Hyperlipidemia Father   . High blood pressure Father   . Breast cancer Maternal Aunt   . Breast cancer Paternal Aunt   . High blood pressure Sister   . Diabetes Sister   . Diabetes Mother   . Mental illness Other     runs in family  . Diabetes Brother   . Mental illness Brother    Social History:  History  Alcohol Use No     History  Drug Use No    History   Social History  . Marital Status: Single    Spouse Name: N/A  . Number of Children: N/A  . Years of Education: N/A   Social History Main Topics  . Smoking status: Never Smoker   . Smokeless tobacco: Never Used  . Alcohol Use: No  . Drug Use: No  . Sexual Activity: Not Currently   Other Topics Concern  . None   Social History Narrative   Additional Social  History:    History of alcohol / drug use?: No history of alcohol / drug abuse                     Allergies:  No Known Allergies  Labs:  Results for orders placed or performed during the hospital encounter of 06/13/15 (from the past 48 hour(s))  CBC with Differential     Status: Abnormal   Collection Time: 06/13/15  5:59 PM  Result Value Ref Range   WBC 7.3 4.0 - 10.5 K/uL   RBC 4.02 3.87 - 5.11 MIL/uL   Hemoglobin 11.4 (L) 12.0 - 15.0 g/dL   HCT 31.8 (L) 36.0 - 46.0 %   MCV 79.1 78.0 - 100.0 fL   MCH 28.4 26.0 - 34.0 pg   MCHC 35.8 30.0 - 36.0 g/dL   RDW 12.2 11.5 - 15.5 %   Platelets 244 150 - 400 K/uL   Neutrophils Relative % 74 43 - 77 %   Neutro Abs 5.4 1.7 - 7.7 K/uL   Lymphocytes Relative 19 12 - 46 %   Lymphs Abs 1.4 0.7 - 4.0 K/uL   Monocytes Relative 7 3 - 12 %   Monocytes Absolute 0.5 0.1 - 1.0 K/uL   Eosinophils Relative 0 0 - 5 %   Eosinophils Absolute 0.0 0.0 - 0.7 K/uL   Basophils Relative 0 0 - 1 %   Basophils Absolute 0.0 0.0 - 0.1 K/uL  Ethanol     Status: None   Collection Time: 06/13/15  5:59 PM  Result Value Ref Range   Alcohol, Ethyl (B) <5 <5 mg/dL    Comment:        LOWEST DETECTABLE LIMIT FOR SERUM ALCOHOL IS 5 mg/dL FOR MEDICAL PURPOSES ONLY   Lithium level     Status: Abnormal   Collection Time: 06/13/15  5:59 PM  Result Value Ref Range   Lithium Lvl 0.52 (L) 0.60 - 1.20 mmol/L  Basic metabolic panel     Status: Abnormal   Collection Time: 06/13/15  7:20 PM  Result Value Ref Range   Sodium 120 (L) 135 - 145 mmol/L   Potassium 2.1 (LL) 3.5 - 5.1 mmol/L    Comment: REPEATED TO VERIFY CRITICAL RESULT CALLED TO, READ BACK BY AND VERIFIED WITH: A.LEDWELL,RN AT 2012 ON 06/13/15 BY W.SHEA    Chloride 80 (L) 101 - 111 mmol/L   CO2 28 22 - 32 mmol/L   Glucose, Bld 127 (H) 65 - 99 mg/dL   BUN 9 6 - 20 mg/dL   Creatinine, Ser 1.07 (H) 0.44 - 1.00 mg/dL   Calcium 9.7 8.9 - 10.3 mg/dL   GFR calc non Af Amer 59 (L) >60 mL/min   GFR calc  Af Amer >60 >60 mL/min    Comment: (NOTE) The eGFR has been calculated using the CKD EPI equation. This calculation has not been validated in all clinical situations. eGFR's persistently <60 mL/min signify possible Chronic Kidney Disease.  Anion gap 12 5 - 15  Urine rapid drug screen (hosp performed)     Status: Abnormal   Collection Time: 06/13/15  9:10 PM  Result Value Ref Range   Opiates NONE DETECTED NONE DETECTED   Cocaine NONE DETECTED NONE DETECTED   Benzodiazepines POSITIVE (A) NONE DETECTED   Amphetamines NONE DETECTED NONE DETECTED   Tetrahydrocannabinol NONE DETECTED NONE DETECTED   Barbiturates NONE DETECTED NONE DETECTED    Comment:        DRUG SCREEN FOR MEDICAL PURPOSES ONLY.  IF CONFIRMATION IS NEEDED FOR ANY PURPOSE, NOTIFY LAB WITHIN 5 DAYS.        LOWEST DETECTABLE LIMITS FOR URINE DRUG SCREEN Drug Class       Cutoff (ng/mL) Amphetamine      1000 Barbiturate      200 Benzodiazepine   468 Tricyclics       032 Opiates          300 Cocaine          300 THC              50   Sodium, urine, random     Status: Abnormal   Collection Time: 06/13/15  9:10 PM  Result Value Ref Range   Sodium, Ur 23 (L) 28 - 272 mmol/L    Comment: (NOTE) ** Please note change in reference range(s). ** Performed at [facility], urine     Status: Abnormal   Collection Time: 06/13/15  9:10 PM  Result Value Ref Range   Osmolality, Ur 117 (L) 390 - 1090 mOsm/kg    Comment: Performed at [facility]  MRSA PCR Screening     Status: None   Collection Time: 06/13/15 10:28 PM  Result Value Ref Range   MRSA by PCR NEGATIVE NEGATIVE    Comment:        The GeneXpert MRSA Assay (FDA approved for NASAL specimens only), is one component of a comprehensive MRSA colonization surveillance program. It is not intended to diagnose MRSA infection nor to guide or monitor treatment for MRSA infections.   Basic metabolic panel     Status: Abnormal    Collection Time: 06/13/15 10:50 PM  Result Value Ref Range   Sodium 121 (L) 135 - 145 mmol/L   Potassium 2.0 (LL) 3.5 - 5.1 mmol/L    Comment: CRITICAL RESULT CALLED TO, READ BACK BY AND VERIFIED WITH: [facility] RN [id number] [place],N REPEATED TO VERIFY    Chloride 84 (L) 101 - 111 mmol/L   CO2 27 22 - 32 mmol/L   Glucose, Bld 131 (H) 65 - 99 mg/dL   BUN 8 6 - 20 mg/dL   Creatinine, Ser 0.95 0.44 - 1.00 mg/dL   Calcium 8.7 (L) 8.9 - 10.3 mg/dL   GFR calc non Af Amer >60 >60 mL/min   GFR calc Af Amer >60 >60 mL/min    Comment: (NOTE) The eGFR has been calculated using the CKD EPI equation. This calculation has not been validated in all clinical situations. eGFR's persistently <60 mL/min signify possible Chronic Kidney Disease.    Anion gap 10 5 - 15  Hepatic function panel     Status: Abnormal   Collection Time: 06/13/15 10:50 PM  Result Value Ref Range   Total Protein 6.3 (L) 6.5 - 8.1 g/dL   Albumin 3.8 3.5 - 5.0 g/dL   AST 38 15 - 41 U/L   ALT 26 14 - 54 U/L  Alkaline Phosphatase 37 (L) 38 - 126 U/L   Total Bilirubin 0.7 0.3 - 1.2 mg/dL   Bilirubin, Direct 0.2 0.1 - 0.5 mg/dL   Indirect Bilirubin 0.5 0.3 - 0.9 mg/dL  Ammonia     Status: Abnormal   Collection Time: 06/13/15 10:50 PM  Result Value Ref Range   Ammonia 50 (H) 9 - 35 umol/L  Cortisol     Status: None   Collection Time: 06/13/15 10:50 PM  Result Value Ref Range   Cortisol, Plasma 22.5 ug/dL    Comment: (NOTE) AM    6.7 - 22.6 ug/dL PM   <10.0       ug/dL Performed at [facility]   TSH     Status: Abnormal   Collection Time: 06/13/15 10:50 PM  Result Value Ref Range   TSH 0.245 (L) 0.350 - 4.500 uIU/mL  Osmolality     Status: Abnormal   Collection Time: 06/13/15 10:50 PM  Result Value Ref Range   Osmolality 251 (L) 275 - 300 mOsm/kg    Comment: Performed at [facility]  Salicylate level     Status: None   Collection Time: 06/13/15 10:50 PM  Result Value Ref Range    Salicylate Lvl <0.2 2.8 - 30.0 mg/dL  Acetaminophen level     Status: Abnormal   Collection Time: 06/13/15 10:50 PM  Result Value Ref Range   Acetaminophen (Tylenol), Serum <10 (L) 10 - 30 ug/mL    Comment:        THERAPEUTIC CONCENTRATIONS VARY SIGNIFICANTLY. A RANGE OF 10-30 ug/mL MAY BE AN EFFECTIVE CONCENTRATION FOR MANY PATIENTS. HOWEVER, SOME ARE BEST TREATED AT CONCENTRATIONS OUTSIDE THIS RANGE. ACETAMINOPHEN CONCENTRATIONS >150 ug/mL AT 4 HOURS AFTER INGESTION AND >50 ug/mL AT 12 HOURS AFTER INGESTION ARE OFTEN ASSOCIATED WITH TOXIC REACTIONS.   Magnesium     Status: None   Collection Time: 06/13/15 10:50 PM  Result Value Ref Range   Magnesium 2.1 1.7 - 2.4 mg/dL  CBC     Status: Abnormal   Collection Time: 06/13/15 10:50 PM  Result Value Ref Range   WBC 7.6 4.0 - 10.5 K/uL   RBC 4.15 3.87 - 5.11 MIL/uL   Hemoglobin 11.8 (L) 12.0 - 15.0 g/dL   HCT 32.8 (L) 36.0 - 46.0 %   MCV 79.0 78.0 - 100.0 fL   MCH 28.4 26.0 - 34.0 pg   MCHC 36.0 30.0 - 36.0 g/dL   RDW 12.3 11.5 - 15.5 %   Platelets 240 150 - 400 K/uL  Urinalysis, Routine w reflex microscopic (not at [facility])     Status: Abnormal   Collection Time: 06/14/15 12:40 AM  Result Value Ref Range   Color, Urine YELLOW YELLOW   APPearance CLEAR CLEAR   Specific Gravity, Urine 1.003 (L) 1.005 - 1.030   pH 7.0 5.0 - 8.0   Glucose, UA NEGATIVE NEGATIVE mg/dL   Hgb urine dipstick NEGATIVE NEGATIVE   Bilirubin Urine NEGATIVE NEGATIVE   Ketones, ur NEGATIVE NEGATIVE mg/dL   Protein, ur NEGATIVE NEGATIVE mg/dL   Urobilinogen, UA 0.2 0.0 - 1.0 mg/dL   Nitrite NEGATIVE NEGATIVE   Leukocytes, UA NEGATIVE NEGATIVE    Comment: MICROSCOPIC NOT DONE ON URINES WITH NEGATIVE PROTEIN, BLOOD, LEUKOCYTES, NITRITE, OR GLUCOSE <1000 mg/dL.  Basic metabolic panel     Status: Abnormal   Collection Time: 06/14/15  6:30 AM  Result Value Ref Range   Sodium 128 (L) 135 - 145 mmol/L    Comment: DELTA CHECK  NOTED REPEATED TO VERIFY     Potassium 2.7 (LL) 3.5 - 5.1 mmol/L    Comment: DELTA CHECK NOTED NO VISIBLE HEMOLYSIS REPEATED TO VERIFY CRITICAL RESULT CALLED TO, READ BACK BY AND VERIFIED WITH: CONRAD,J. RN AT 2774 06/14/15 MULLINS,T    Chloride 92 (L) 101 - 111 mmol/L   CO2 29 22 - 32 mmol/L   Glucose, Bld 115 (H) 65 - 99 mg/dL   BUN 8 6 - 20 mg/dL   Creatinine, Ser 0.98 0.44 - 1.00 mg/dL   Calcium 9.3 8.9 - 10.3 mg/dL   GFR calc non Af Amer >60 >60 mL/min   GFR calc Af Amer >60 >60 mL/min    Comment: (NOTE) The eGFR has been calculated using the CKD EPI equation. This calculation has not been validated in all clinical situations. eGFR's persistently <60 mL/min signify possible Chronic Kidney Disease.    Anion gap 7 5 - 15  CBC WITH DIFFERENTIAL     Status: Abnormal   Collection Time: 06/14/15  6:30 AM  Result Value Ref Range   WBC 7.6 4.0 - 10.5 K/uL   RBC 4.46 3.87 - 5.11 MIL/uL   Hemoglobin 12.5 12.0 - 15.0 g/dL   HCT 35.5 (L) 36.0 - 46.0 %   MCV 79.6 78.0 - 100.0 fL   MCH 28.0 26.0 - 34.0 pg   MCHC 35.2 30.0 - 36.0 g/dL   RDW 12.4 11.5 - 15.5 %   Platelets 238 150 - 400 K/uL   Neutrophils Relative % 69 43 - 77 %   Neutro Abs 5.2 1.7 - 7.7 K/uL   Lymphocytes Relative 23 12 - 46 %   Lymphs Abs 1.8 0.7 - 4.0 K/uL   Monocytes Relative 8 3 - 12 %   Monocytes Absolute 0.6 0.1 - 1.0 K/uL   Eosinophils Relative 0 0 - 5 %   Eosinophils Absolute 0.0 0.0 - 0.7 K/uL   Basophils Relative 0 0 - 1 %   Basophils Absolute 0.0 0.0 - 0.1 K/uL    Vitals: Blood pressure 132/62, pulse 76, temperature 98.1 F (36.7 C), temperature source Oral, resp. rate 24, height 5' 1"  (1.549 m), weight 76 kg (167 lb 8.8 oz), SpO2 98 %.  Risk to Self: Suicidal Ideation: Yes-Currently Present Suicidal Intent: No Is patient at risk for suicide?: No Suicidal Plan?: No Access to Means: No What has been your use of drugs/alcohol within the last 12 months?: No alcohol or drug use reported.  How many times?: 1 Other Self Harm  Risks: No other self harm risk identified at this time.  Triggers for Past Attempts: Unpredictable Intentional Self Injurious Behavior: None Risk to Others: Homicidal Ideation: No Thoughts of Harm to Others: No Current Homicidal Intent: No Current Homicidal Plan: No Access to Homicidal Means: No Identified Victim: N/A History of harm to others?: No Assessment of Violence: On admission Violent Behavior Description: No violent behaviors observed. Pt is calm and cooperative at this time.  Does patient have access to weapons?: No Criminal Charges Pending?: No Does patient have a court date: No Prior Inpatient Therapy: Prior Inpatient Therapy: Yes Prior Therapy Dates:  (Dates unknown ) Prior Therapy Facilty/Provider(s): [facility]  Reason for Treatment: SI, non-compliance with medication Prior Outpatient Therapy: Prior Outpatient Therapy: Yes Prior Therapy Dates: Current  Prior Therapy Facilty/Provider([facility]): [name]  Reason for Treatment: Bipolar  Does patient have an ACCT team?: Unknown Does patient have Intensive In-House Services?  : No Does patient have [name] services? :  Yes Does patient have [facility] services?: Unknown  Current Facility-Administered Medications  Medication Dose Route Frequency Provider Last Rate Last Dose  . acetaminophen (TYLENOL) tablet 650 mg  650 mg Oral Q6H PRN [name], MD       Or  . acetaminophen (TYLENOL) suppository 650 mg  650 mg Rectal Q6H PRN [name], MD      . enoxaparin (LOVENOX) injection 40 mg  40 mg Subcutaneous QHS [name], MD   40 mg at 06/14/15 0145  . hydrALAZINE (APRESOLINE) injection 10 mg  10 mg Intravenous Q4H PRN [name], MD      . LORazepam (ATIVAN) injection 1 mg  1 mg Intravenous Q4H PRN [name], MD      . ondansetron [facility]) tablet 4 mg  4 mg Oral Q6H PRN [name], MD       Or  . ondansetron [facility]) injection 4 mg  4 mg Intravenous Q6H  PRN [name], MD      . sodium chloride 0.9 % injection 3 mL  3 mL Intravenous Q12H [name], MD   3 mL at 06/13/15 2307    Musculoskeletal: Strength & Muscle Tone: decreased Gait & Station: unable to stand Patient leans: N/A  Psychiatric Specialty Exam: Physical Exam as per history and physical   ROS denied nausea, vomiting, diarrhea, abdominal pain, shortness of breath, chest pain No Fever-chills, No Headache, No changes with Vision or hearing, reports vertigo No problems swallowing food or Liquids, No Chest pain, Cough or Shortness of Breath, No Abdominal pain, No Nausea or Vommitting, Bowel movements are regular, No Blood in stool or Urine, No dysuria, No new skin rashes or bruises, No new joints pains-aches,  No new weakness, tingling, numbness in any extremity, No recent weight gain or loss, No polyuria, polydypsia or polyphagia,   A full 10 point Review of Systems was done, except as stated above, all other Review of Systems were negative.  Blood pressure 132/62, pulse 76, temperature 98.1 F (36.7 C), temperature source Oral, resp. rate 24, height 5' 1"  (1.549 m), weight 76 kg (167 lb 8.8 oz), SpO2 98 %.Body mass index is 31.67 kg/(m^2).  General Appearance: Guarded  Eye Contact::  Good  Speech:  Pressured  Volume:  Increased  Mood:  Anxious  Affect:  Constricted and Depressed  Thought Process:  Circumstantial, Irrelevant and Tangential  Orientation:  Full (Time, Place, and Person)  Thought Content:  Rumination  Suicidal Thoughts:  No  Homicidal Thoughts:  No  Memory:  Immediate;   Fair Recent;   Poor  Judgement:  Fair  Insight:  Fair  Psychomotor Activity:  Restlessness  Concentration:  Fair  Recall:  Poor  Fund of Knowledge:Good  Language: Good  Akathisia:  Negative  Handed:  Right  AIMS (if indicated):     Assets:  Communication Skills Desire for Improvement Financial Resources/Insurance Housing Leisure Time Resilience Social  Support Talents/Skills Transportation Vocational/Educational  ADL's:  Impaired  Cognition: WNL  Sleep:      Medical Decision Making: New problem, with additional work up planned, Review of Psycho-Social Stressors (1), Review or order clinical lab tests (1), Review or order medicine tests (1), Review of Medication Regimen & Side Effects (2) and Review of New Medication or Change in Dosage (2)  Treatment Plan Summary: Patient presented with symptoms of bipolar mania, confusion, dehydration and abnormal sodium and potassium and thyroid-stimulating hormone. She has a history of lithium and Mellaril  therapy over 10 years. Daily contact with patient to assess and evaluate symptoms and progress in treatment and Medication management  Plan:  Discontinue Mellaril and other anti-psychotic secondary to QTC prolongation Discontinue lithium secondary to hypokalemia and hyponatremia and thyroid abnormality We start Depakote ER 500 mg twice daily for mood swings and manic symptoms Clonazepam 0.5 mg twice daily for anxiety Monitor for the valproic acid level for therapeutic benefits  Patient does not meet criteria for psychiatric inpatient admission. Supportive therapy provided about ongoing stressors.   Appreciate psychiatric consultation and follow up as clinically required Please contact [phone] or [phone] if needs further assistance  Disposition: Patient may be able to participate in outpatient medication management when medically stable.  Kamerin Grumbine,JANARDHAHA R. 06/14/2015 10:51 AM

## 2015-06-14 NOTE — Progress Notes (Signed)
Received patient from  SDU, alert and oriented, calm. Agree with previous RN's assessment.

## 2015-06-15 DIAGNOSIS — F3113 Bipolar disorder, current episode manic without psychotic features, severe: Secondary | ICD-10-CM

## 2015-06-15 DIAGNOSIS — I10 Essential (primary) hypertension: Secondary | ICD-10-CM

## 2015-06-15 LAB — BASIC METABOLIC PANEL
Anion gap: 6 (ref 5–15)
BUN: 12 mg/dL (ref 6–20)
CO2: 30 mmol/L (ref 22–32)
Calcium: 9.6 mg/dL (ref 8.9–10.3)
Chloride: 102 mmol/L (ref 101–111)
Creatinine, Ser: 1.02 mg/dL — ABNORMAL HIGH (ref 0.44–1.00)
GFR calc Af Amer: >60 mL/min (ref >60)
GFR calc non Af Amer: >60 mL/min (ref >60)
Glucose, Bld: 102 mg/dL — ABNORMAL HIGH (ref 65–99)
Potassium: 3 mmol/L — ABNORMAL LOW (ref 3.5–5.1)
Sodium: 138 mmol/L (ref 135–145)

## 2015-06-15 LAB — MAGNESIUM: Magnesium: 2.3 mg/dL (ref 1.7–2.4)

## 2015-06-15 MED ORDER — POTASSIUM CHLORIDE CRYS ER 20 MEQ PO TBCR
40.0000 meq | EXTENDED_RELEASE_TABLET | Freq: Two times a day (BID) | ORAL | Status: AC
  Administered 2015-06-15 (×2): 40 meq via ORAL
  Filled 2015-06-15 (×2): qty 2

## 2015-06-15 NOTE — Progress Notes (Signed)
Patient ID: Paula Dickson, female   DOB: Oct 26, 1966, 49 y.o.   MRN: [id number] [facility] PROGRESS NOTE  Paula Dickson MCN:[id number] DOB: 28-Jan-1966 DOA: June 15, 2015 PCP: Arnette Norris, MD  Brief narrative:    49 y.o. female with past medical history of bipolar disorder, psychosis, hypertension who presented to [facility] ED with agitation and confusion and apparently hallucinations which started one day prior to this admission. Patient was given Versed 5 mg IM by EMS and then Geodon 20 mg IM once she arrived to ER and she was still agitated. Patient was hemodynamically stable on the admission. Her 12-lead EKG showed QT prolongation of 600 ms. Her blood work was significant for hemoglobin of 11.4, sodium of 120, potassium of 2.1 and creatinine of 1.07. She was admitted to stepdown unit for first 24 hour observation. Patient was seen by psychiatry in consultation. Per psychiatry, recommendation is to stop lithium altogether. Psychiatry added Depakote.  Barrier to discharge: Supplement potassium today. Recheck BMP tomorrow. Anticipated discharge 06/16/2015.   Assessment/Plan:    Principal Problem: Acute encephalopathy  / delirium  / Acute psychosis - Patient with altered mental status and known history of bipolar disorder and psychosis - Per psychiatry, lithium likely contributory to acute encephalopathy. Lithium will not be resumed on discharge - Psychiatry added Depakote - Mental status okay this morning. - Lithium level WNL   Active Problems: Hypokalemia - Likely from thiazide diuretic  - Supplemented  Hyponatremia - Likely from lithium and/or HCTZ - Sodium normalized with IV fluids  Essential hypertension - HCTZ on hold. - Blood pressure at goal   DVT Prophylaxis   - Lovenox subcutaneous ordered palpation in hospital   Code Status: Full.  Family Communication:  plan of care discussed with the patient Disposition Plan: Home 06/16/2015  IV access:  Peripheral  IV  Procedures and diagnostic studies:    Dg Chest Port 1 View 06-15-15   Lungs hypoexpanded, with underlying vascular congestion. No focal airspace consolidation seen.    Medical Consultants:  Psychiatry  Other Consultants:  None   IAnti-Infectives:   None    Leisa Lenz, MD  [facility] Pager [phone]  Time spent in minutes: 15 minutes  If 7PM-7AM, please contact night-coverage [email or web address] Password [email or web address] 06/15/2015, 11:19 AM   LOS: 2 days    HPI/Subjective: No acute overnight events. Patient reports feeling little bit better than yesterday.  Objective: Filed Vitals:   06/14/15 0800 06/14/15 1410 06/14/15 2130 06/15/15 0535  BP: 132/62 124/67 126/71 131/74  Pulse: 76 82 84 72  Temp: 98.1 F (36.7 C) 98 F (36.7 C) 98.7 F (37.1 C) 98.5 F (36.9 C)  TempSrc: Oral Oral Oral Oral  Resp: 24 20 20 20   Height:  5\' 1"  (1.549 m)    Weight:      SpO2: 98% 100% 99% 100%    Intake/Output Summary (Last 24 hours) at 06/15/15 1119 Last data filed at 06/15/15 4650  Gross per 24 hour  Intake    480 ml  Output      0 ml  Net    480 ml    Exam:   General:  Pt is alert, oriented to time, place and person  Cardiovascular: Regular rate and rhythm, S1/S2 appreciated  Respiratory: No wheezing, no rhonchi  Abdomen: Appreciate bowel sounds, nontender abdomen  Extremities: No lower extremity edema, pulses palpable  Neuro: No focal neurological deficits  Data Reviewed: Basic Metabolic Panel:  Recent Labs Lab 06-15-2015 1920 06-15-2015 2250 06/14/15 0630 06/14/15 1040  06/15/15 1015  NA 120* 121* 128* 133* 138  K 2.1* 2.0* 2.7* 2.9* 3.0*  CL 80* 84* 92* 95* 102  CO2 28 27 29 31 30   GLUCOSE 127* 131* 115* 123* 102*  BUN 9 8 8 8 12   CREATININE 1.07* 0.95 0.98 1.03* 1.02*  CALCIUM 9.7 8.7* 9.3 9.8 9.6  MG  --  2.1  --   --  2.3   Liver Function Tests:  Recent Labs Lab 06/13/15 2250  AST 38  ALT 26  ALKPHOS 37*  BILITOT 0.7  PROT 6.3*   ALBUMIN 3.8   No results for input(s): LIPASE, AMYLASE in the last 168 hours.  Recent Labs Lab 06/13/15 2250  AMMONIA 50*   CBC:  Recent Labs Lab 06/13/15 1759 06/13/15 2250 06/14/15 0630  WBC 7.3 7.6 7.6  NEUTROABS 5.4  --  5.2  HGB 11.4* 11.8* 12.5  HCT 31.8* 32.8* 35.5*  MCV 79.1 79.0 79.6  PLT 244 240 238   Cardiac Enzymes: No results for input(s): CKTOTAL, CKMB, CKMBINDEX, TROPONINI in the last 168 hours. BNP: Invalid input(s): POCBNP CBG: No results for input(s): GLUCAP in the last 168 hours.  Recent Results (from the past 240 hour(s))  MRSA PCR Screening     Status: None   Collection Time: 06/13/15 10:28 PM  Result Value Ref Range Status   MRSA by PCR NEGATIVE NEGATIVE Final     Scheduled Meds: . clonazePAM  0.5 mg Oral BID  . divalproex  500 mg Oral Q12H  . enoxaparin (LOVENOX) injection  40 mg Subcutaneous QHS  . potassium chloride  40 mEq Oral BID  . sodium chloride  3 mL Intravenous Q12H

## 2015-06-16 DIAGNOSIS — N179 Acute kidney failure, unspecified: Secondary | ICD-10-CM

## 2015-06-16 LAB — BASIC METABOLIC PANEL
Anion gap: 8 (ref 5–15)
BUN: 14 mg/dL (ref 6–20)
CO2: 29 mmol/L (ref 22–32)
Calcium: 9.5 mg/dL (ref 8.9–10.3)
Chloride: 105 mmol/L (ref 101–111)
Creatinine, Ser: 1.08 mg/dL — ABNORMAL HIGH (ref 0.44–1.00)
GFR calc Af Amer: >60 mL/min (ref >60)
GFR calc non Af Amer: 58 mL/min — ABNORMAL LOW (ref >60)
Glucose, Bld: 96 mg/dL (ref 65–99)
Potassium: 3.6 mmol/L (ref 3.5–5.1)
Sodium: 142 mmol/L (ref 135–145)

## 2015-06-16 MED ORDER — DIVALPROEX SODIUM 500 MG PO DR TAB: 500.0000 mg | DELAYED_RELEASE_TABLET | Freq: Two times a day (BID) | ORAL | Status: DC

## 2015-06-16 MED ORDER — CLONAZEPAM 0.5 MG PO TABS: 0.5000 mg | ORAL_TABLET | Freq: Two times a day (BID) | ORAL | Status: DC

## 2015-06-16 MED ORDER — AMLODIPINE BESYLATE 5 MG PO TABS: 5.0000 mg | ORAL_TABLET | Freq: Every day | ORAL | Status: DC

## 2015-06-16 NOTE — Discharge Summary (Signed)
Physician Discharge Summary  Avneet Ashmore TML:[id number] DOB: 02-11-1966 DOA: 06/13/2015  PCP: Arnette Norris, MD  Admit date: 06/13/2015 Discharge date: 06/16/2015  Recommendations for Outpatient Follow-up:  New medications for bipolar disorder: Depakote and clonazepam New blood pressure medication is Norvasc 5 mg daily. Please hold Hctz due to renal insufficiency.  Discharge Diagnoses:  Principal Problem:   Bipolar disorder with severe mania Active Problems:   Hypokalemia   Acute psychosis   Hyponatremia   Hypertension, uncontrolled    Discharge Condition: stable   Diet recommendation: as tolerated   History of present illness:  49 y.o. female with past medical history of bipolar disorder, psychosis, hypertension who presented to [facility] ED with agitation and confusion and apparently hallucinations which started one day prior to this admission. Patient was given Versed 5 mg IM by EMS and then Geodon 20 mg IM once she arrived to ER and she was still agitated. Patient was hemodynamically stable on the admission. Her 12-lead EKG showed QT prolongation of 600 ms. Her blood work was significant for hemoglobin of 11.4, sodium of 120, potassium of 2.1 and creatinine of 1.07. She was admitted to stepdown unit for first 24 hour observation. Patient was seen by psychiatry in consultation. Per psychiatry, recommendation is to stop lithium altogether. Psychiatry added Depakote and clonazepam. Mental status good this morning.  Hospital Course:   Assessment/Plan:    Principal Problem: Acute encephalopathy / delirium / Acute psychosis - Patient with altered mental status and known history of bipolar disorder and psychosis - Per psychiatry, lithium likely contributory to acute encephalopathy. Lithium will not be resumed on discharge - Psychiatry added Depakote and clonazepam. - Mental status good this morning. Patient is alert and oriented to time, place and person.   Active  Problems: Hypokalemia - Likely from thiazide diuretic  - Supplemented - Patient will not continue thiazide diaphoretic on discharge because of acute renal insufficiency.  Hyponatremia - Likely from lithium and/or HCTZ - Sodium normalized with IV fluids  Essential hypertension - HCTZ on hold because of acute renal insufficiency - Added Norvasc 5 mg daily for better blood pressure control. Current blood pressure is 139/72.  Acute renal failure - Likely from HCTZ which was placed on hold - Creatinine is stable at 1.08.   DVT Prophylaxis  - Lovenox subcutaneous ordered palpation in hospital   Code Status: Full.  Family Communication: plan of care discussed with the patient and her sister at the bedside. They both understand new medications, indications and side effects discussed with the patient and her sister.  IV access:  Peripheral IV  Procedures and diagnostic studies:   Dg Chest Port 1 View 06/13/2015 Lungs hypoexpanded, with underlying vascular congestion. No focal airspace consolidation seen.   Medical Consultants:  Psychiatry  Other Consultants:  None   IAnti-Infectives:   None     Signed:  Leisa Lenz, MD  [facility] 06/16/2015, 10:31 AM  Pager #: [phone]  Time spent in minutes: more than 30 minutes  Discharge Exam: Filed Vitals:   06/16/15 0527  BP: 139/72  Pulse: 70  Temp: 97.9 F (36.6 C)  Resp: 16   Filed Vitals:   06/15/15 0535 06/15/15 1405 06/15/15 2138 06/16/15 0527  BP: 131/74 124/65 130/84 139/72  Pulse: 72 76 75 70  Temp: 98.5 F (36.9 C) 98.7 F (37.1 C) 98.6 F (37 C) 97.9 F (36.6 C)  TempSrc: Oral Oral Oral Oral  Resp: 20 18 18 16   Height:  Weight:      SpO2: 100% 100% 95% 100%    General: Pt is alert, follows commands appropriately, not in acute distress Cardiovascular: Regular rate and rhythm, S1/S2 +, no murmurs Respiratory: Clear to auscultation bilaterally, no wheezing, no  crackles, no rhonchi Abdominal: Soft, non tender, non distended, bowel sounds +, no guarding Extremities: no edema, no cyanosis, pulses palpable bilaterally DP and PT Neuro: Grossly nonfocal  Discharge Instructions  Discharge Instructions    Call MD for:  difficulty breathing, headache or visual disturbances    Complete by:  As directed      Call MD for:  persistant nausea and vomiting    Complete by:  As directed      Call MD for:  severe uncontrolled pain    Complete by:  As directed      Diet - low sodium heart healthy    Complete by:  As directed      Discharge instructions    Complete by:  As directed   New medications for bipolar disorder: Depakote and clonazepam New blood pressure medication is Norvasc 5 mg daily. Please hold Hctz due to renal insufficiency.     Increase activity slowly    Complete by:  As directed             Medication List    STOP taking these medications        hydrochlorothiazide 25 MG tablet  Commonly known as:  HYDRODIURIL     lithium carbonate 150 MG capsule     thioridazine 25 MG tablet  Commonly known as:  MELLARIL      TAKE these medications        clonazePAM 0.5 MG tablet  Commonly known as:  KLONOPIN  Take 1 tablet (0.5 mg total) by mouth 2 (two) times daily.     divalproex 500 MG DR tablet  Commonly known as:  DEPAKOTE  Take 1 tablet (500 mg total) by mouth every 12 (twelve) hours.     multivitamin capsule  Take 1 capsule by mouth 2 (two) times daily.           Follow-up Information    Follow up with Arnette Norris, MD. Schedule an appointment as soon as possible for a visit in 1 week.   Specialty:  Family Medicine   Why:  Follow up appt after recent hospitalization   Contact information:   [place] [place] [id number] [phone]        The results of significant diagnostics from this hospitalization (including imaging, microbiology, ancillary and laboratory) are listed below for reference.     Significant Diagnostic Studies: Dg Chest Port 1 View  06/13/2015   CLINICAL DATA:  Acute onset of manic behavior.  Initial encounter.  EXAM: PORTABLE CHEST - 1 VIEW  COMPARISON:  None.  FINDINGS: The lungs are hypoexpanded. Vascular crowding and vascular congestion are noted. There is no evidence of focal opacification, pleural effusion or pneumothorax.  The cardiomediastinal silhouette is within normal limits. No acute osseous abnormalities are seen.  IMPRESSION: Lungs hypoexpanded, with underlying vascular congestion. No focal airspace consolidation seen.   Electronically Signed   By: Garald Balding M.D.   On: 06/13/2015 22:53    Microbiology: Recent Results (from the past 240 hour(s))  MRSA PCR Screening     Status: None   Collection Time: 06/13/15 10:28 PM  Result Value Ref Range Status   MRSA by PCR NEGATIVE NEGATIVE Final    Comment:  The GeneXpert MRSA Assay (FDA approved for NASAL specimens only), is one component of a comprehensive MRSA colonization surveillance program. It is not intended to diagnose MRSA infection nor to guide or monitor treatment for MRSA infections.      Labs: Basic Metabolic Panel:  Recent Labs Lab 06/13/15 2250 06/14/15 0630 06/14/15 1040 06/15/15 1015 06/16/15 0500  NA 121* 128* 133* 138 142  K 2.0* 2.7* 2.9* 3.0* 3.6  CL 84* 92* 95* 102 105  CO2 27 29 31 30 29   GLUCOSE 131* 115* 123* 102* 96  BUN 8 8 8 12 14   CREATININE 0.95 0.98 1.03* 1.02* 1.08*  CALCIUM 8.7* 9.3 9.8 9.6 9.5  MG 2.1  --   --  2.3  --    Liver Function Tests:  Recent Labs Lab 06/13/15 2250  AST 38  ALT 26  ALKPHOS 37*  BILITOT 0.7  PROT 6.3*  ALBUMIN 3.8   No results for input(s): LIPASE, AMYLASE in the last 168 hours.  Recent Labs Lab 06/13/15 2250  AMMONIA 50*   CBC:  Recent Labs Lab 06/13/15 1759 06/13/15 2250 06/14/15 0630  WBC 7.3 7.6 7.6  NEUTROABS 5.4  --  5.2  HGB 11.4* 11.8* 12.5  HCT 31.8* 32.8* 35.5*  MCV 79.1 79.0 79.6   PLT 244 240 238   Cardiac Enzymes: No results for input(s): CKTOTAL, CKMB, CKMBINDEX, TROPONINI in the last 168 hours. BNP: BNP (last 3 results) No results for input(s): BNP in the last 8760 hours.  ProBNP (last 3 results) No results for input(s): PROBNP in the last 8760 hours.  CBG: No results for input(s): GLUCAP in the last 168 hours.

## 2015-06-16 NOTE — Progress Notes (Signed)
Discharge to home, sister to transport patient. D/c instructions and follow up appointment done and was given to the patient, verbalized understanding. PIV removed by NT no s/s of infiltration or swelling noted.

## 2015-06-16 NOTE — Discharge Instructions (Signed)
Altered Mental Status Altered mental status most often refers to an abnormal change in your responsiveness and awareness. It can affect your speech, thought, mobility, memory, attention span, or alertness. It can range from slight confusion to complete unresponsiveness (coma). Altered mental status can be a sign of a serious underlying medical condition. Rapid evaluation and medical treatment is necessary for patients having an altered mental status. CAUSES   Low blood sugar (hypoglycemia) or diabetes.  Severe loss of body fluids (dehydration) or a body salt (electrolyte) imbalance.  A stroke or other neurologic problem, such as dementia or delirium.  A head injury or tumor.  A drug or alcohol overdose.  Exposure to toxins or poisons.  Depression, anxiety, and stress.  A low oxygen level (hypoxia).  An infection.  Blood loss.  Twitching or shaking (seizure).  Heart problems, such as heart attack or heart rhythm problems (arrhythmias).  A body temperature that is too low or too high (hypothermia or hyperthermia). DIAGNOSIS  A diagnosis is based on your history, symptoms, physical and neurologic examinations, and diagnostic tests. Diagnostic tests may include:  Measurement of your blood pressure, pulse, breathing, and oxygen levels (vital signs).  Blood tests.  Urine tests.  X-ray exams.  A computerized magnetic scan (magnetic resonance imaging, MRI).  A computerized X-ray scan (computed tomography, CT scan). TREATMENT  Treatment will depend on the cause. Treatment may include:  Management of an underlying medical or mental health condition.  Critical care or support in the hospital. [place]   Only take over-the-counter or prescription medicines for pain, discomfort, or fever as directed by your caregiver.  Manage underlying conditions as directed by your caregiver.  Eat a healthy, well-balanced diet to maintain strength.  Join a support group or  prevention program to cope with the condition or trauma that caused the altered mental status. Ask your caregiver to help choose a program that works for you.  Follow up with your caregiver for further examination, therapy, or testing as directed. SEEK MEDICAL CARE IF:   You feel unwell or have chills.  You or your family notice a change in your behavior or your alertness.  You have trouble following your caregiver's treatment plan.  You have questions or concerns. SEEK IMMEDIATE MEDICAL CARE IF:   You have a rapid heartbeat or have chest pain.  You have difficulty breathing.  You have a fever.  You have a headache with a stiff neck.  You cough up blood.  You have blood in your urine or stool.  You have severe agitation or confusion. MAKE SURE YOU:   Understand these instructions.  Will watch your condition.  Will get help right away if you are not doing well or get worse. Document Released: 05/03/2010 Document Revised: 02/05/2012 Document Reviewed: 05/03/2010 [facility] Patient Information 2015 [place], [place]. This information is not intended to replace advice given to you by your health care provider. Make sure you discuss any questions you have with your health care provider.  Valproic Acid, Divalproex Sodium delayed or extended-release tablets What is this medicine? DIVALPROEX SODIUM (dye VAL pro ex SO dee um) is used to prevent seizures caused by some forms of epilepsy. It is also used to treat bipolar mania and to prevent migraine headaches. This medicine may be used for other purposes; ask your health care provider or pharmacist if you have questions. COMMON BRAND NAME(S): Depakote, Depakote ER What should I tell my health care provider before I take this medicine? They need to  know if you have any of these conditions: -blood disease -brain damage or disease -kidney disease -liver disease -low blood proteins -mitochondrial disease -suicidal thoughts, plans, or  attempt; a previous suicide attempt by you or a family member -urea cycle disorder (UCD) -an unusual or allergic reaction to divalproex sodium, other medicines, foods, dyes, or preservatives -pregnant or trying to get pregnant -breast-feeding How should I use this medicine? Take this medicine by mouth with a drink of water. Follow the directions on the prescription label. Do not crush or chew. If this medicine upsets your stomach, take it with food or milk. Take your medicine at regular intervals. Do not take it more often than directed. Talk to your pediatrician regarding the use of this medicine in children. Special care may be needed. Overdosage: If you think you have taken too much of this medicine contact a poison control center or emergency room at once. NOTE: This medicine is only for you. Do not share this medicine with others. What if I miss a dose? If you miss a dose, take it as soon as you can. If it is almost time for your next dose, take only that dose. Do not take double or extra doses. What may interact with this medicine? -aspirin -barbiturates, like phenobarbital -diazepam -isoniazid -medicines for depression, anxiety, or psychotic disturbances -medicines that treat or prevent blood clots like warfarin -meropenem -other seizure medicines -rifampin -tolbutamide -zidovudine This list may not describe all possible interactions. Give your health care provider a list of all the medicines, herbs, non-prescription drugs, or dietary supplements you use. Also tell them if you smoke, drink alcohol, or use illegal drugs. Some items may interact with your medicine. What should I watch for while using this medicine? Visit your doctor or health care professional for regular checks on your progress. If you are taking this medicine to treat epilepsy (seizures), do not stop taking it suddenly. This increases the risk of seizures. Wear a medical identification bracelet or chain to say you  have epilepsy or seizures, and carry a card that lists all your medicines. You may get drowsy, dizzy, or have blurred vision. Do not drive, use machinery, or do anything that needs mental alertness until you know how this medicine affects you. To reduce dizzy or fainting spells, do not sit or stand up quickly, especially if you are an older patient. Alcohol can increase drowsiness and dizziness. Avoid alcoholic drinks. This medicine can cause blood problems. This can mean slow healing and a risk of infection. Problems can arise if you need dental work, and in the day to day care of your teeth. Try to avoid damage to your teeth and gums when you brush or floss your teeth. This medicine can make you more sensitive to the sun. Keep out of the sun. If you cannot avoid being in the sun, wear protective clothing and use sunscreen. Do not use sun lamps or tanning beds/booths. The use of this medicine may increase the chance of suicidal thoughts or actions. Pay special attention to how you are responding while on this medicine. Any worsening of mood, or thoughts of suicide or dying should be reported to your health care professional right away. Women who become pregnant while using this medicine may enroll in the [place] Pregnancy Registry by calling [phone]. This registry collects information about the safety of antiepileptic drug use during pregnancy. Contact your doctor or healthcare professional if you notice any part of your medicine in your stool.  Your healthcare provider may want to check the amount of medicine in your blood if this happens. What side effects may I notice from receiving this medicine? Side effects that you should report to your doctor or health care professional as soon as possible: -allergic reactions like skin rash, itching or hives, swelling of the face, lips, or tongue -changes in the frequency or severity of seizures -double vision or uncontrollable  eye movements -nausea and vomiting -redness, blistering, peeling or loosening of the skin, including inside the mouth -stomach pain or cramps -trembling of hands or arms -unusual bleeding or bruising or pinpoint red spots on the skin -unusual swelling of the arms or legs -unusually weak or tired -worsening of mood, thoughts or actions of suicide or dying -yellowing of skin or eyes Side effects that usually do not require medical attention (report to your doctor or health care professional if they continue or are bothersome): -change in menstrual cycle -diarrhea or constipation -headache -loss of bladder control -loss of hair or unusual growth of hair -loss or increase in appetite -weight gain or loss This list may not describe all possible side effects. Call your doctor for medical advice about side effects. You may report side effects to FDA at 1-800-FDA-1088. Where should I keep my medicine? Keep out of reach of children. Store at room temperature between 15 and 30 degrees C (59 and 86 degrees F). Keep container tightly closed. Throw away any unused medicine after the expiration date. NOTE: This sheet is a summary. It may not cover all possible information. If you have questions about this medicine, talk to your doctor, pharmacist, or health care provider.  2015, Elsevier/Gold Standard. (2012-07-09 11:50:42)  Clonazepam tablets What is this medicine? CLONAZEPAM (kloe NA ze pam) is a benzodiazepine. It is used to treat certain types of seizures. It is also used to treat panic disorder. This medicine may be used for other purposes; ask your health care provider or pharmacist if you have questions. COMMON BRAND NAME(S): Ceberclon, Klonopin What should I tell my health care provider before I take this medicine? They need to know if you have any of these conditions: -an alcohol or drug abuse problem -bipolar disorder, depression, psychosis or other mental health  condition -glaucoma -kidney or liver disease -lung or breathing disease -myasthenia gravis -Parkinson's disease -seizures or a history of seizures -suicidal thoughts -an unusual or allergic reaction to clonazepam, other benzodiazepines, foods, dyes, or preservatives -pregnant or trying to get pregnant -breast-feeding How should I use this medicine? Take this medicine by mouth with a glass of water. Follow the directions on the prescription label. If it upsets your stomach, take it with food or milk. Take your medicine at regular intervals. Do not take it more often than directed. Do not stop taking or change the dose except on the advice of your doctor or health care professional. A special MedGuide will be given to you by the pharmacist with each prescription and refill. Be sure to read this information carefully each time. Talk to your pediatrician regarding the use of this medicine in children. Special care may be needed. Overdosage: If you think you have taken too much of this medicine contact a poison control center or emergency room at once. NOTE: This medicine is only for you. Do not share this medicine with others. What if I miss a dose? If you miss a dose, take it as soon as you can. If it is almost time for your next dose, take only  that dose. Do not take double or extra doses. What may interact with this medicine? -herbal or dietary supplements -medicines for depression, anxiety, or psychotic disturbances -medicines for fungal infections like fluconazole, itraconazole, ketoconazole, voriconazole -medicines for HIV infection or AIDS -medicines for sleep -prescription pain medicines -propantheline -rifampin -sevelamer -some medicines for seizures like carbamazepine, phenobarbital, phenytoin, primidone This list may not describe all possible interactions. Give your health care provider a list of all the medicines, herbs, non-prescription drugs, or dietary supplements you use.  Also tell them if you smoke, drink alcohol, or use illegal drugs. Some items may interact with your medicine. What should I watch for while using this medicine? Visit your doctor or health care professional for regular checks on your progress. Your body may become dependent on this medicine. If you have been taking this medicine regularly for some time, do not suddenly stop taking it. You must gradually reduce the dose or you may get severe side effects. Ask your doctor or health care professional for advice before increasing or decreasing the dose. Even after you stop taking this medicine it can still affect your body for several days. If you suffer from several types of seizures, this medicine may increase the chance of grand mal seizures (epilepsy). Let your doctor or health care professional know, he or she may want to prescribe an additional medicine. You may get drowsy or dizzy. Do not drive, use machinery, or do anything that needs mental alertness until you know how this medicine affects you. To reduce the risk of dizzy and fainting spells, do not stand or sit up quickly, especially if you are an older patient. Alcohol may increase dizziness and drowsiness. Avoid alcoholic drinks. Do not treat yourself for coughs, colds or allergies without asking your doctor or health care professional for advice. Some ingredients can increase possible side effects. The use of this medicine may increase the chance of suicidal thoughts or actions. Pay special attention to how you are responding while on this medicine. Any worsening of mood, or thoughts of suicide or dying should be reported to your health care professional right away. Women who become pregnant while using this medicine may enroll in the [place] Pregnancy Registry by calling [phone]. This registry collects information about the safety of antiepileptic drug use during pregnancy. What side effects may I notice from  receiving this medicine? Side effects that you should report to your doctor or health care professional as soon as possible: -allergic reactions like skin rash, itching or hives, swelling of the face, lips, or tongue -changes in vision -confusion -depression -hallucinations -mood changes, excitability or aggressive behavior -movement difficulty, staggering or jerky movements -muscle cramps, weakness -tremors -unusual eye movements Side effects that usually do not require medical attention (report to your doctor or health care professional if they continue or are bothersome): -constipation or diarrhea -difficulty sleeping, nightmares -dizziness, drowsiness -headache -increased saliva from your mouth -nausea, vomiting This list may not describe all possible side effects. Call your doctor for medical advice about side effects. You may report side effects to FDA at 1-800-FDA-1088. Where should I keep my medicine? Keep out of the reach of children. This medicine can be abused. Keep your medicine in a safe place to protect it from theft. Do not share this medicine with anyone. Selling or giving away this medicine is dangerous and against the law. Store at room temperature between 15 and 30 degrees C (59 and 86 degrees F). Protect from light. Keep  container tightly closed. Throw away any unused medicine after the expiration date. NOTE: This sheet is a summary. It may not cover all possible information. If you have questions about this medicine, talk to your doctor, pharmacist, or health care provider.  2015, Elsevier/Gold Standard. (2009-10-08 19:16:36)  Amlodipine tablets What is this medicine? AMLODIPINE (am LOE di peen) is a calcium-channel blocker. It affects the amount of calcium found in your heart and muscle cells. This relaxes your blood vessels, which can reduce the amount of work the heart has to do. This medicine is used to lower high blood pressure. It is also used to prevent chest  pain. This medicine may be used for other purposes; ask your health care provider or pharmacist if you have questions. COMMON BRAND NAME(S): Norvasc What should I tell my health care provider before I take this medicine? They need to know if you have any of these conditions: -heart problems like heart failure or aortic stenosis -liver disease -an unusual or allergic reaction to amlodipine, other medicines, foods, dyes, or preservatives -pregnant or trying to get pregnant -breast-feeding How should I use this medicine? Take this medicine by mouth with a glass of water. Follow the directions on the prescription label. Take your medicine at regular intervals. Do not take more medicine than directed. Talk to your pediatrician regarding the use of this medicine in children. Special care may be needed. This medicine has been used in children as young as 6. Persons over 85 years old may have a stronger reaction to this medicine and need smaller doses. Overdosage: If you think you have taken too much of this medicine contact a poison control center or emergency room at once. NOTE: This medicine is only for you. Do not share this medicine with others. What if I miss a dose? If you miss a dose, take it as soon as you can. If it is almost time for your next dose, take only that dose. Do not take double or extra doses. What may interact with this medicine? -herbal or dietary supplements -local or general anesthetics -medicines for high blood pressure -medicines for prostate problems -rifampin This list may not describe all possible interactions. Give your health care provider a list of all the medicines, herbs, non-prescription drugs, or dietary supplements you use. Also tell them if you smoke, drink alcohol, or use illegal drugs. Some items may interact with your medicine. What should I watch for while using this medicine? Visit your doctor or health care professional for regular check ups. Check your  blood pressure and pulse rate regularly. Ask your health care professional what your blood pressure and pulse rate should be, and when you should contact him or her. This medicine may make you feel confused, dizzy or lightheaded. Do not drive, use machinery, or do anything that needs mental alertness until you know how this medicine affects you. To reduce the risk of dizzy or fainting spells, do not sit or stand up quickly, especially if you are an older patient. Avoid alcoholic drinks; they can make you more dizzy. Do not suddenly stop taking amlodipine. Ask your doctor or health care professional how you can gradually reduce the dose. What side effects may I notice from receiving this medicine? Side effects that you should report to your doctor or health care professional as soon as possible: -allergic reactions like skin rash, itching or hives, swelling of the face, lips, or tongue -breathing problems -changes in vision or hearing -chest pain -fast, irregular heartbeat -  swelling of legs or ankles Side effects that usually do not require medical attention (report to your doctor or health care professional if they continue or are bothersome): -dry mouth -facial flushing -nausea, vomiting -stomach gas, pain -tired, weak -trouble sleeping This list may not describe all possible side effects. Call your doctor for medical advice about side effects. You may report side effects to FDA at 1-800-FDA-1088. Where should I keep my medicine? Keep out of the reach of children. Store at room temperature between 59 and 86 degrees F (15 and 30 degrees C). Protect from light. Keep container tightly closed. Throw away any unused medicine after the expiration date. NOTE: This sheet is a summary. It may not cover all possible information. If you have questions about this medicine, talk to your doctor, pharmacist, or health care provider.  2015, Elsevier/Gold Standard. (2012-10-11 11:40:58)

## 2015-06-24 ENCOUNTER — Ambulatory Visit (INDEPENDENT_AMBULATORY_CARE_PROVIDER_SITE_OTHER): Payer: 59 | Admitting: Family Medicine

## 2015-06-24 ENCOUNTER — Encounter: Payer: Self-pay | Admitting: Family Medicine

## 2015-06-24 VITALS — BP 148/76 | HR 71 | Temp 98.2°F | Wt 159.5 lb

## 2015-06-24 DIAGNOSIS — E876 Hypokalemia: Secondary | ICD-10-CM

## 2015-06-24 DIAGNOSIS — F23 Brief psychotic disorder: Secondary | ICD-10-CM

## 2015-06-24 DIAGNOSIS — F3113 Bipolar disorder, current episode manic without psychotic features, severe: Secondary | ICD-10-CM

## 2015-06-24 DIAGNOSIS — F312 Bipolar disorder, current episode manic severe with psychotic features: Secondary | ICD-10-CM

## 2015-06-24 DIAGNOSIS — N179 Acute kidney failure, unspecified: Secondary | ICD-10-CM | POA: Diagnosis not present

## 2015-06-24 DIAGNOSIS — I10 Essential (primary) hypertension: Secondary | ICD-10-CM

## 2015-06-24 DIAGNOSIS — E871 Hypo-osmolality and hyponatremia: Secondary | ICD-10-CM | POA: Diagnosis not present

## 2015-06-24 DIAGNOSIS — F29 Unspecified psychosis not due to a substance or known physiological condition: Secondary | ICD-10-CM

## 2015-06-24 LAB — BASIC METABOLIC PANEL
BUN: 15 mg/dL (ref 6–23)
CO2: 32 mEq/L (ref 19–32)
Calcium: 9.6 mg/dL (ref 8.4–10.5)
Chloride: 106 mEq/L (ref 96–112)
Creatinine, Ser: 1.08 mg/dL (ref 0.40–1.20)
GFR: 68.67 mL/min (ref >60.00)
Glucose, Bld: 89 mg/dL (ref 70–99)
Potassium: 4.2 mEq/L (ref 3.5–5.1)
Sodium: 142 mEq/L (ref 135–145)

## 2015-06-24 NOTE — Patient Instructions (Addendum)
Good to see you.  We will call you with your lab results.  Please follow up with your psychiatrist.

## 2015-06-24 NOTE — Assessment & Plan Note (Signed)
Resolving.  Recheck BMET today. Lab Results  Component Value Date   CREATININE 1.08* 06/16/2015

## 2015-06-24 NOTE — Progress Notes (Signed)
Pre visit review using our clinic review tool, if applicable. No additional management support is needed unless otherwise documented below in the visit note. 

## 2015-06-24 NOTE — Progress Notes (Signed)
Subjective:   Patient ID: Paula Dickson, female    DOB: Oct 29, 1966, 49 y.o.   MRN: [id number]  Paula Dickson is a pleasant 49 y.o. year old female who presents to clinic today with Hospitalization Follow-up  on 06/24/2015  HPI:  Admitted to [facility] 7/17- 06/16/15 for altered mental status. Notes reviewed.  Psychiatry consulted and felt that lithium was contributing to her acute encephalopathy.  Depakote and Clonazepam were added. Still going to [facility] (medications- appt on 9/22) and [facility]. She says she is feeling better now.  Does not have tremors with new rx.  Was found to be hypokalemic and hyponatremic as well so bp meds were changed- HCTZ d/'d and sent home on Norvasc 5 mg daily.  Acute renal insufficiency- felt to be pre renal in combination with HCTZ.  Lab Results  Component Value Date   NA 142 06/16/2015   K 3.6 06/16/2015   CL 105 06/16/2015   CO2 29 06/16/2015   Lab Results  Component Value Date   CREATININE 1.08* 06/16/2015   Current Outpatient Prescriptions on File Prior to Visit  Medication Sig Dispense Refill  . amLODipine (NORVASC) 5 MG tablet Take 1 tablet (5 mg total) by mouth daily. 30 tablet 0  . clonazePAM (KLONOPIN) 0.5 MG tablet Take 1 tablet (0.5 mg total) by mouth 2 (two) times daily. 20 tablet 0  . divalproex (DEPAKOTE) 500 MG DR tablet Take 1 tablet (500 mg total) by mouth every 12 (twelve) hours. 60 tablet 0  . Multiple Vitamin (MULTIVITAMIN) capsule Take 1 capsule by mouth 2 (two) times daily.      No current facility-administered medications on file prior to visit.    No Known Allergies  Past Medical History  Diagnosis Date  . History of arthritis   . History of chicken pox   . History of depression   . History of genital warts   . history of heart murmur   . History of high blood pressure   . History of thyroid disease   . History of UTI   . Bipolar affective disorder   . Hypertension     Past  Surgical History  Procedure Laterality Date  . Cyst removal neck      around 11 years ago /benign  . Multiple tooth extractions    . Ablation on endometriosis      Family History  Problem Relation Age of Onset  . Alcohol abuse Paternal Uncle   . Alcohol abuse Paternal Grandfather   . Arthritis Father   . Hyperlipidemia Father   . High blood pressure Father   . Breast cancer Maternal Aunt   . Breast cancer Paternal Aunt   . High blood pressure Sister   . Diabetes Sister   . Diabetes Mother   . Mental illness Other     runs in family  . Diabetes Brother   . Mental illness Brother     History   Social History  . Marital Status: Single    Spouse Name: N/A  . Number of Children: N/A  . Years of Education: N/A   Occupational History  . Not on file.   Social History Main Topics  . Smoking status: Never Smoker   . Smokeless tobacco: Never Used  . Alcohol Use: No  . Drug Use: No  . Sexual Activity: Not Currently   Other Topics Concern  . Not on file   Social History Narrative   The PMH, [place],  Social History, Family History, Medications, and allergies have been reviewed in [facility], and have been updated if relevant.   Review of Systems  Constitutional: Negative.   Musculoskeletal: Negative.   Skin: Negative.   Neurological: Negative.   Hematological: Negative.   Psychiatric/Behavioral: Negative.   All other systems reviewed and are negative.      Objective:    BP 148/76 mmHg  Pulse 71  Temp(Src) 98.2 F (36.8 C) (Oral)  Wt 159 lb 8 oz (72.349 kg)  SpO2 98%   Physical Exam  Constitutional: She is oriented to person, place, and time. She appears well-developed and well-nourished. No distress.  HENT:  Head: Normocephalic.  Eyes: Conjunctivae are normal.  Cardiovascular: Normal rate.   Pulmonary/Chest: Effort normal.  Musculoskeletal: Normal range of motion.  Neurological: She is alert and oriented to person, place, and time. No cranial nerve deficit.    Skin: Skin is warm and dry.  Psychiatric: She has a normal mood and affect. Her behavior is normal. Judgment and thought content normal.  Nursing note and vitals reviewed.         Assessment & Plan:   Bipolar affective disorder, currently manic, severe, with psychotic features  Hypertension, uncontrolled  Bipolar disorder with severe mania  Acute psychosis  Acute renal failure, unspecified acute renal failure type - Plan: Basic metabolic panel  Hypokalemia - Plan: Basic metabolic panel  Hyponatremia - Plan: Basic metabolic panel No Follow-up on file.

## 2015-06-24 NOTE — Assessment & Plan Note (Signed)
Resolved.  No on new rxs.  Has follow up scheduled with psych.

## 2015-06-24 NOTE — Assessment & Plan Note (Signed)
Recheck BMET today 

## 2015-06-24 NOTE — Assessment & Plan Note (Signed)
Reasonable control.  She will continue to monitor her blood pressure at home and keep me updated.

## 2015-06-25 ENCOUNTER — Encounter: Payer: Self-pay | Admitting: Family Medicine

## 2015-06-25 ENCOUNTER — Other Ambulatory Visit: Payer: Self-pay | Admitting: Internal Medicine

## 2015-06-29 ENCOUNTER — Telehealth: Payer: Self-pay

## 2015-06-29 NOTE — Telephone Encounter (Signed)
Called Paula Dickson We had received a request to refill two of her medications Paula Morrissey is not a patient in our facility Instructed her to contact her primary Doctor to have her prescriptions refilled

## 2015-07-03 ENCOUNTER — Encounter (HOSPITAL_COMMUNITY): Payer: Self-pay

## 2015-07-03 ENCOUNTER — Emergency Department (HOSPITAL_COMMUNITY)
Admission: EM | Admit: 2015-07-03 | Discharge: 2015-07-05 | Disposition: A | Discharge status: Other Acute Inpatient Hospital | Payer: 59 | Attending: Emergency Medicine | Admitting: Emergency Medicine

## 2015-07-03 DIAGNOSIS — F419 Anxiety disorder, unspecified: Secondary | ICD-10-CM | POA: Insufficient documentation

## 2015-07-03 DIAGNOSIS — Z8619 Personal history of other infectious and parasitic diseases: Secondary | ICD-10-CM | POA: Diagnosis not present

## 2015-07-03 DIAGNOSIS — I10 Essential (primary) hypertension: Secondary | ICD-10-CM | POA: Insufficient documentation

## 2015-07-03 DIAGNOSIS — Z79899 Other long term (current) drug therapy: Secondary | ICD-10-CM | POA: Insufficient documentation

## 2015-07-03 DIAGNOSIS — Z046 Encounter for general psychiatric examination, requested by authority: Secondary | ICD-10-CM | POA: Diagnosis present

## 2015-07-03 DIAGNOSIS — Z8739 Personal history of other diseases of the musculoskeletal system and connective tissue: Secondary | ICD-10-CM | POA: Diagnosis not present

## 2015-07-03 DIAGNOSIS — Z3202 Encounter for pregnancy test, result negative: Secondary | ICD-10-CM | POA: Diagnosis not present

## 2015-07-03 DIAGNOSIS — R011 Cardiac murmur, unspecified: Secondary | ICD-10-CM | POA: Diagnosis not present

## 2015-07-03 DIAGNOSIS — F3113 Bipolar disorder, current episode manic without psychotic features, severe: Secondary | ICD-10-CM | POA: Insufficient documentation

## 2015-07-03 DIAGNOSIS — F312 Bipolar disorder, current episode manic severe with psychotic features: Secondary | ICD-10-CM | POA: Diagnosis present

## 2015-07-03 DIAGNOSIS — Z87440 Personal history of urinary (tract) infections: Secondary | ICD-10-CM | POA: Insufficient documentation

## 2015-07-03 DIAGNOSIS — Z8639 Personal history of other endocrine, nutritional and metabolic disease: Secondary | ICD-10-CM | POA: Insufficient documentation

## 2015-07-03 LAB — COMPREHENSIVE METABOLIC PANEL
ALT: 19 U/L (ref 14–54)
AST: 22 U/L (ref 15–41)
Albumin: 4.6 g/dL (ref 3.5–5.0)
Alkaline Phosphatase: 49 U/L (ref 38–126)
Anion gap: 12 (ref 5–15)
BUN: 11 mg/dL (ref 6–20)
CO2: 27 mmol/L (ref 22–32)
Calcium: 10.7 mg/dL — ABNORMAL HIGH (ref 8.9–10.3)
Chloride: 103 mmol/L (ref 101–111)
Creatinine, Ser: 1.18 mg/dL — ABNORMAL HIGH (ref 0.44–1.00)
GFR calc Af Amer: >60 mL/min (ref >60)
GFR calc non Af Amer: 52 mL/min — ABNORMAL LOW (ref >60)
Glucose, Bld: 114 mg/dL — ABNORMAL HIGH (ref 65–99)
Potassium: 3.3 mmol/L — ABNORMAL LOW (ref 3.5–5.1)
Sodium: 142 mmol/L (ref 135–145)
Total Bilirubin: 0.5 mg/dL (ref 0.3–1.2)
Total Protein: 8 g/dL (ref 6.5–8.1)

## 2015-07-03 LAB — RAPID URINE DRUG SCREEN, HOSP PERFORMED
Amphetamines: NOT DETECTED
Barbiturates: NOT DETECTED
Benzodiazepines: NOT DETECTED
Cocaine: NOT DETECTED
Opiates: NOT DETECTED
Tetrahydrocannabinol: NOT DETECTED

## 2015-07-03 LAB — CBC
HCT: 43.2 % (ref 36.0–46.0)
Hemoglobin: 15.1 g/dL — ABNORMAL HIGH (ref 12.0–15.0)
MCH: 29.6 pg (ref 26.0–34.0)
MCHC: 35 g/dL (ref 30.0–36.0)
MCV: 84.7 fL (ref 78.0–100.0)
Platelets: 194 10*3/uL (ref 150–400)
RBC: 5.1 MIL/uL (ref 3.87–5.11)
RDW: 13.1 % (ref 11.5–15.5)
WBC: 6.8 10*3/uL (ref 4.0–10.5)

## 2015-07-03 LAB — I-STAT BETA HCG BLOOD, ED (MC, WL, AP ONLY): I-stat hCG, quantitative: <5 m[IU]/mL (ref <5)

## 2015-07-03 LAB — ETHANOL: Alcohol, Ethyl (B): <5 mg/dL (ref <5)

## 2015-07-03 MED ORDER — LORAZEPAM 1 MG PO TABS
1.0000 mg | ORAL_TABLET | Freq: Three times a day (TID) | ORAL | Status: DC | PRN
  Filled 2015-07-03: qty 1

## 2015-07-03 MED ORDER — HALOPERIDOL LACTATE 5 MG/ML IJ SOLN
10.0000 mg | Freq: Once | INTRAMUSCULAR | Status: AC
  Administered 2015-07-03: 10 mg via INTRAMUSCULAR

## 2015-07-03 MED ORDER — ALUM & MAG HYDROXIDE-SIMETH 200-200-20 MG/5ML PO SUSP
30.0000 mL | ORAL | Status: DC | PRN
Start: 2015-07-03 — End: 2015-07-05

## 2015-07-03 MED ORDER — AMLODIPINE BESYLATE 5 MG PO TABS
5.0000 mg | ORAL_TABLET | Freq: Every day | ORAL | Status: DC
  Administered 2015-07-03 – 2015-07-05 (×3): 5 mg via ORAL
  Filled 2015-07-03 (×3): qty 1

## 2015-07-03 MED ORDER — ADULT MULTIVITAMIN W/MINERALS CH
1.0000 | ORAL_TABLET | Freq: Two times a day (BID) | ORAL | Status: DC
  Administered 2015-07-03 – 2015-07-05 (×4): 1 via ORAL
  Filled 2015-07-03 (×6): qty 1

## 2015-07-03 MED ORDER — ACETAMINOPHEN 325 MG PO TABS: 650.0000 mg | ORAL_TABLET | ORAL | Status: DC | PRN

## 2015-07-03 MED ORDER — LORAZEPAM 2 MG/ML IJ SOLN
INTRAMUSCULAR | Status: AC
  Administered 2015-07-03: 23:00:00 via INTRAMUSCULAR
  Filled 2015-07-03: qty 1

## 2015-07-03 MED ORDER — POTASSIUM CHLORIDE CRYS ER 20 MEQ PO TBCR
40.0000 meq | EXTENDED_RELEASE_TABLET | Freq: Once | ORAL | Status: AC
  Administered 2015-07-03: 40 meq via ORAL
  Filled 2015-07-03: qty 2

## 2015-07-03 MED ORDER — IBUPROFEN 200 MG PO TABS: 600.0000 mg | ORAL_TABLET | Freq: Three times a day (TID) | ORAL | Status: DC | PRN

## 2015-07-03 MED ORDER — DIPHENHYDRAMINE HCL 50 MG/ML IJ SOLN
INTRAMUSCULAR | Status: AC
  Administered 2015-07-03: 23:00:00 via INTRAMUSCULAR
  Filled 2015-07-03: qty 1

## 2015-07-03 MED ORDER — CLONAZEPAM 0.5 MG PO TABS
0.5000 mg | ORAL_TABLET | Freq: Two times a day (BID) | ORAL | Status: DC
  Administered 2015-07-03 – 2015-07-05 (×4): 0.5 mg via ORAL
  Filled 2015-07-03 (×4): qty 1

## 2015-07-03 MED ORDER — DIVALPROEX SODIUM 500 MG PO DR TAB
500.0000 mg | DELAYED_RELEASE_TABLET | Freq: Two times a day (BID) | ORAL | Status: DC
  Administered 2015-07-03 – 2015-07-05 (×4): 500 mg via ORAL
  Filled 2015-07-03 (×4): qty 1

## 2015-07-03 MED ORDER — DIPHENHYDRAMINE HCL 50 MG/ML IJ SOLN
50.0000 mg | Freq: Once | INTRAMUSCULAR | Status: AC
  Administered 2015-07-03: 50 mg via INTRAMUSCULAR

## 2015-07-03 MED ORDER — LORAZEPAM 2 MG/ML IJ SOLN
2.0000 mg | Freq: Once | INTRAMUSCULAR | Status: AC
  Administered 2015-07-03: 2 mg via INTRAMUSCULAR

## 2015-07-03 MED ORDER — HALOPERIDOL LACTATE 5 MG/ML IJ SOLN
INTRAMUSCULAR | Status: AC
  Administered 2015-07-03: 10 mg via INTRAMUSCULAR
  Filled 2015-07-03: qty 2

## 2015-07-03 MED ORDER — ONDANSETRON HCL 4 MG PO TABS: 4.0000 mg | ORAL_TABLET | Freq: Three times a day (TID) | ORAL | Status: DC | PRN

## 2015-07-03 NOTE — ED Notes (Signed)
Patient is manic with pressured speech.  Support and comfort offered.  Urine cup given with instruction.

## 2015-07-03 NOTE — ED Notes (Signed)
Patient pacing the unit, irritable and agitation. Patient increasingly loud and disruptive to the milieu. Cursing and threatening the staff. Patient refused to be verbally redirected. Continue to be loud and disruptive. Dr. Juanda Crumble notified, ordered Haldol 10 mg IM, Ativan 2 mg IM, Benadryl 50 mg IM STAT.
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
## 2015-07-03 NOTE — ED Notes (Signed)
Pt's brother was waiting at the bedside after the Pt changed.  Pt immediately became very agitated and stated that she "just needs to be left alone, so she can be centered."  Pt's brother apologized and then left.    As ED staff was taking the Pt to [facility], the Pt was laughing and smiling.

## 2015-07-03 NOTE — BH Assessment (Addendum)
Tele Assessment Note   Paula Dickson is an 49 y.o. female, single, black who presents unaccompanied to [place] ED after being brought by family members due to symptoms of mania. Pt has a history of bipolar disorder and recent manic episodes. She currently presents with pressured speech, disorganized thought process and labile mood. She is unable to answer most question appropriately. She does denies current suicidal ideation or homicidal ideation. She is unable to say whether she is experiencing auditory or visual hallucinations. Pt is unable to answer whether she is using alcohol or other substances but Pt has no history of alcohol or substance abuse.  Pt is dressed in hospital scrubs, alert, and not oriented to person, location, time or situation. Her speech is pressured. Motor behavior is normal. Eye contact is good. Pt's mood is labile and she was crying one moment and then laughing. Affect is labile. Thought process is disorganized. Pt's insight and judgment are clearly impaired. Pt was generally cooperative.   Pt was last at [facility] on 06/13/15 with manic symptoms and was diagnosed with hyponatremia and hypokalemia at that time. She was admitted to a medical floor and then discharged to outpatient treatment when medically cleared. The following is from the assessment by Jillene Bucks, [place] on 06/13/15.  "Pt stated "I need help, I slander Jesus name and everybody's name". "I am hurting the family because I won't forgive or be quiet". "I'm not using my words correctly but I know what I'm trying to say". "I'm ungrateful, I just killed the image of [place]". Pt is endorsing suicidal ideations but denies having an active plan. Pt reported that she attempted suicide in the past and has been hospitalized several times. Pt reported that she is currently receiving mental health treatment through [facility] and [place]. Pt reported that she has not been taking her medication as prescribed and shared  that today she took pills at once instead of the one in the morning and the other later in the day. Pt was unable to recall the name of that medication at this time. Pt denies HI and AVH at this time. Pt did report any alcohol or illicit substance abuse at this time. Pt denied having access to weapons or firearms. Pt did not report any pending criminal charges or upcoming court dates. Pt did not report any physical, sexual or emotional abuse at this time."   Axis I: Bipolar I Disorder, Current Epsiode Manic Axis II: Deferred Axis III:  Past Medical History  Diagnosis Date  . History of arthritis   . History of chicken pox   . History of depression   . History of genital warts   . history of heart murmur   . History of high blood pressure   . History of thyroid disease   . History of UTI   . Bipolar affective disorder   . Hypertension    Axis IV: other psychosocial or environmental problems Axis V: GAF=20  Past Medical History:  Past Medical History  Diagnosis Date  . History of arthritis   . History of chicken pox   . History of depression   . History of genital warts   . history of heart murmur   . History of high blood pressure   . History of thyroid disease   . History of UTI   . Bipolar affective disorder   . Hypertension     Past Surgical History  Procedure Laterality Date  . Cyst removal neck  around 11 years ago /benign  . Multiple tooth extractions    . Ablation on endometriosis      Family History:  Family History  Problem Relation Age of Onset  . Alcohol abuse Paternal Uncle   . Alcohol abuse Paternal Grandfather   . Arthritis Father   . Hyperlipidemia Father   . High blood pressure Father   . Breast cancer Maternal Aunt   . Breast cancer Paternal Aunt   . High blood pressure Sister   . Diabetes Sister   . Diabetes Mother   . Mental illness Other     runs in family  . Diabetes Brother   . Mental illness Brother     Social History:   reports that she has never smoked. She has never used smokeless tobacco. She reports that she does not drink alcohol or use illicit drugs.  Additional Social History:  Alcohol / Drug Use Pain Medications: None Prescriptions: See MAR Over the Counter: See MAR History of alcohol / drug use?: No history of alcohol / drug abuse Longest period of sobriety (when/how long): NA  CIWA: CIWA-Ar BP: 156/87 mmHg Pulse Rate: 118 COWS:    PATIENT STRENGTHS: (choose at least two) Ability for insight Average or above average intelligence Capable of independent living Financial means General fund of knowledge Physical Health Supportive family/friends  Allergies: No Known Allergies  Home Medications:  (Not in a hospital admission)  OB/GYN Status:  No LMP recorded. Patient is not currently having periods (Reason: Perimenopausal).  General Assessment Data Location of Assessment: [facility] TTS Assessment: In system Is this a Tele or Face-to-Face Assessment?: Face-to-Face Is this an Initial Assessment or a Re-assessment for this encounter?: Initial Assessment Marital status: Single Maiden name: NA Is patient pregnant?: No Pregnancy Status: No Living Arrangements: Parent (Father) Can pt return to current living arrangement?: Yes Admission Status: Voluntary Is patient capable of signing voluntary admission?: Yes Referral Source: Self/Family/Friend Insurance type: Health and safety inspector Care Plan Living Arrangements: Parent (Father) Name of Psychiatrist: Warden/ranger  Name of Therapist: Warden/ranger   Education Status Is patient currently in school?: No Current Grade: NA Highest grade of school patient has completed: N/A Name of school: N/A Contact person: N/A  Risk to self with the past 6 months Suicidal Ideation: No Has patient been a risk to self within the past 6 months prior to admission? : No Suicidal Intent: No Has patient had any suicidal intent within the past 6 months prior  to admission? : No Is patient at risk for suicide?: No Suicidal Plan?: No Has patient had any suicidal plan within the past 6 months prior to admission? : No Access to Means: No What has been your use of drugs/alcohol within the last 12 months?: Pt denies Previous Attempts/Gestures: Yes How many times?: 1 Other Self Harm Risks: Pt is disorganized and not oriented Triggers for Past Attempts: Unpredictable Intentional Self Injurious Behavior: None Family Suicide History: Yes ([place]) Recent stressful life event(s): Other (Comment) (Pt unable to answer to question) Persecutory voices/beliefs?: No Depression: No Depression Symptoms: Insomnia Substance abuse history and/or treatment for substance abuse?: No Suicide prevention information given to non-admitted patients: Not applicable  Risk to Others within the past 6 months Homicidal Ideation: No Does patient have any lifetime risk of violence toward others beyond the six months prior to admission? : No Thoughts of Harm to Others: No Current Homicidal Intent: No Current Homicidal Plan: No Access to Homicidal Means: No Identified  Victim: NA History of harm to others?: No Assessment of Violence: None Noted Violent Behavior Description: No know history of violence Does patient have access to weapons?: No Criminal Charges Pending?: No Does patient have a court date: No Is patient on probation?: No  Psychosis Hallucinations: None noted Delusions: None noted  Mental Status Report Appearance/Hygiene: In hospital gown Eye Contact: Good Motor Activity: Unremarkable Speech: Incoherent, Pressured Level of Consciousness: Alert, Crying, Other (Comment) (laughing) Mood: Labile Affect: Labile Anxiety Level: Minimal Thought Processes: Tangential, Irrelevant Judgement: Impaired Orientation: Not oriented Obsessive Compulsive Thoughts/Behaviors: None  Cognitive Functioning Concentration: Decreased Memory: Unable to Assess IQ:  Average Insight: Poor Impulse Control: Unable to Assess Appetite: Fair Weight Loss: 0 Weight Gain: 0 Sleep: Decreased Total Hours of Sleep: 0 (Unknown) Vegetative Symptoms: None  ADLScreening [facility] Assessment Services) Patient's cognitive ability adequate to safely complete daily activities?: Yes Patient able to express need for assistance with ADLs?: Yes Independently performs ADLs?: Yes (appropriate for developmental age)  Prior Inpatient Therapy Prior Inpatient Therapy: Yes Prior Therapy Dates: unknown Prior Therapy Facilty/Provider(s): [facility]  Reason for Treatment: SI, non-compliance with medication  Prior Outpatient Therapy Prior Outpatient Therapy: Yes Prior Therapy Dates: Current  Prior Therapy Facilty/Provider(s): [name]  Reason for Treatment: Bipolar  Does patient have an ACCT team?: No Does patient have Intensive In-House Services?  : No Does patient have [name] services? : No Does patient have P4CC services?: Unknown  ADL Screening (condition at time of admission) Patient's cognitive ability adequate to safely complete daily activities?: Yes Is the patient deaf or have difficulty hearing?: No Does the patient have difficulty seeing, even when wearing glasses/contacts?: No Does the patient have difficulty concentrating, remembering, or making decisions?: No Patient able to express need for assistance with ADLs?: Yes Does the patient have difficulty dressing or bathing?: No Independently performs ADLs?: Yes (appropriate for developmental age) Weakness of Legs: None Weakness of Arms/Hands: None  Home Assistive Devices/Equipment Home Assistive Devices/Equipment: Eyeglasses    Abuse/Neglect Assessment (Assessment to be complete while patient is alone) Physical Abuse: Denies Verbal Abuse: Denies Sexual Abuse: Denies Exploitation of patient/patient's resources: Denies Self-Neglect: Denies     Regulatory affairs officer (For  Healthcare) Does patient have an advance directive?: No Would patient like information on creating an advanced directive?: No - patient declined information    Additional Information 1:1 In Past 12 Months?: No CIRT Risk: No Elopement Risk: No Does patient have medical clearance?: Yes     Disposition: Debarah Crape, [facility] at [facility], confirms 500- unit is currently at capacity. Gave clinical information to Darlyne Russian, PA who states Pt meets criteria for inpatient psychiatric treatment and recommends that Pt be given potassium. Notified Dalia Heading, PA-C of recommendation. TTS will contact other facilities for placement.  Disposition Initial Assessment Completed for this Encounter: Yes Disposition of Patient: Inpatient treatment program Type of inpatient treatment program: Adult  Evelena Peat, [facility], [facility], [facility] Triage Specialist [phone]   Evelena Peat 07/03/2015 7:58 PM

## 2015-07-03 NOTE — ED Notes (Signed)
Pt told that she is going to get blood drawn and asked for a urine sample, so we can "help get her straightened out."

## 2015-07-03 NOTE — BH Assessment (Signed)
Received notification of TTS consult request. Spoke to Paula Albino, PA-C who said Pt is disorganized and manic. Tele-assessment will be initiated.  Orpah Greek Anson Fret, [place], [facility], [facility] Triage Specialist [phone]

## 2015-07-03 NOTE — ED Notes (Signed)
Pt changed into scrubs and wanded by security  

## 2015-07-04 DIAGNOSIS — F3113 Bipolar disorder, current episode manic without psychotic features, severe: Secondary | ICD-10-CM | POA: Diagnosis not present

## 2015-07-04 LAB — VALPROIC ACID LEVEL: Valproic Acid Lvl: 112 ug/mL — ABNORMAL HIGH (ref 50.0–100.0)

## 2015-07-04 MED ORDER — DOCUSATE SODIUM 100 MG PO CAPS
100.0000 mg | ORAL_CAPSULE | Freq: Once | ORAL | Status: AC
  Administered 2015-07-04: 100 mg via ORAL
  Filled 2015-07-04 (×2): qty 1

## 2015-07-04 MED ORDER — OLANZAPINE 5 MG PO TBDP
5.0000 mg | ORAL_TABLET | Freq: Two times a day (BID) | ORAL | Status: DC
  Administered 2015-07-04 – 2015-07-05 (×2): 5 mg via ORAL
  Filled 2015-07-04 (×2): qty 1

## 2015-07-04 NOTE — ED Notes (Signed)
D: Pt is talking about a family reunion but then she states that she does not understand why she is here. Pt then states that she has been taking care of her dad and that she does not understand why she has to stay here. Pt also states that she could not take care of her mother so she was placed in an assisted living and she has to help her dad take care of her. Pt's speech is pressured and loud.  A: Encouragement and support provided. Zyprexa Zydis given as ordered. R: Pt states she is "chillin" and has no problems at this time.

## 2015-07-04 NOTE — ED Notes (Signed)
C/O constipation.  Prune juice and ordered medication given.

## 2015-07-04 NOTE — Consult Note (Signed)
[facility] Consult   Reason for Consult: Exacerbation of Bipolar disorder, mania Referring Physician:  EDP Patient Identification: Paula Dickson MRN:  [id number] Principal Diagnosis: Bipolar disorder with severe mania Diagnosis:   Patient Active Problem List   Diagnosis Date Noted  . Bipolar disorder with severe mania [F31.13] 06/14/2015    Priority: High  . Acute renal failure syndrome [N17.9]   . Hypokalemia [E87.6] 06/13/2015  . Acute psychosis [F29] 06/13/2015  . Hyponatremia [E87.1] 06/13/2015  . Hypertension, uncontrolled [I10] 06/13/2015  . HTN (hypertension) [I10] 04/02/2014  . Manic depression [F31.9] 04/02/2014    Total Time spent with patient: 45 minutes  Subjective:   Paula Dickson is a 49 y.o. female patient admitted with Exacerbation of Bipolar disorder, mania.  HPI: AA female, 49 years old was evaluated for irritability, agitation and manic behavior.  Patient had tangential speech with flight of ideas.  She could not engage in the interview..  She asked providers to close their eyes for a prayer before speaking.  Patient could not state why she came to the hospital.  She stated "the Police keeps coming to my house.  I love my dad and who does not love their dad"  Her speech was pressured and she was playing with her breakfast.  She stopped answering questions and repeated stated that she love her dad.   Patient was seen and evaluated her in the ER last month and was sent home to be seen by her outpatient Psychiatrist for medication management.  Per family patient is compliant with her medications.  We have accepted patient for admission and we will be seeking placement.  HPI Elements:   Location:  Exacerbation of Bipolar disorder, manic type, agitataion, . Quality:  severe, pressured speech, disorganized speech, agitataion, . Severity:  severe. Timing:  acute. Duration:  Chronic mental illness. Context:  Brought in by EMS for agitataion and manic  symptoms..  Past Medical History:  Past Medical History  Diagnosis Date  . History of arthritis   . History of chicken pox   . History of depression   . History of genital warts   . history of heart murmur   . History of high blood pressure   . History of thyroid disease   . History of UTI   . Bipolar affective disorder   . Hypertension     Past Surgical History  Procedure Laterality Date  . Cyst removal neck      around 11 years ago /benign  . Multiple tooth extractions    . Ablation on endometriosis     Family History:  Family History  Problem Relation Age of Onset  . Alcohol abuse Paternal Uncle   . Alcohol abuse Paternal Grandfather   . Arthritis Father   . Hyperlipidemia Father   . High blood pressure Father   . Breast cancer Maternal Aunt   . Breast cancer Paternal Aunt   . High blood pressure Sister   . Diabetes Sister   . Diabetes Mother   . Mental illness Other     runs in family  . Diabetes Brother   . Mental illness Brother    Social History:  History  Alcohol Use No     History  Drug Use No    History   Social History  . Marital Status: Single    Spouse Name: N/A  . Number of Children: N/A  . Years of Education: N/A   Social History Main Topics  . Smoking status:  Never Smoker   . Smokeless tobacco: Never Used  . Alcohol Use: No  . Drug Use: No  . Sexual Activity: Not Currently   Other Topics Concern  . None   Social History Narrative   Additional Social History:    Pain Medications: None Prescriptions: See MAR Over the Counter: See MAR History of alcohol / drug use?: No history of alcohol / drug abuse Longest period of sobriety (when/how long): NA                     Allergies:  No Known Allergies  Labs:  Results for orders placed or performed during the hospital encounter of 07/03/15 (from the past 48 hour(s))  Comprehensive metabolic panel     Status: Abnormal   Collection Time: 07/03/15  5:41 PM  Result Value  Ref Range   Sodium 142 135 - 145 mmol/L   Potassium 3.3 (L) 3.5 - 5.1 mmol/L   Chloride 103 101 - 111 mmol/L   CO2 27 22 - 32 mmol/L   Glucose, Bld 114 (H) 65 - 99 mg/dL   BUN 11 6 - 20 mg/dL   Creatinine, Ser 1.18 (H) 0.44 - 1.00 mg/dL   Calcium 10.7 (H) 8.9 - 10.3 mg/dL   Total Protein 8.0 6.5 - 8.1 g/dL   Albumin 4.6 3.5 - 5.0 g/dL   AST 22 15 - 41 U/L   ALT 19 14 - 54 U/L   Alkaline Phosphatase 49 38 - 126 U/L   Total Bilirubin 0.5 0.3 - 1.2 mg/dL   GFR calc non Af Amer 52 (L) >60 mL/min   GFR calc Af Amer >60 >60 mL/min    Comment: (NOTE) The eGFR has been calculated using the CKD EPI equation. This calculation has not been validated in all clinical situations. eGFR's persistently <60 mL/min signify possible Chronic Kidney Disease.    Anion gap 12 5 - 15  Ethanol (ETOH)     Status: None   Collection Time: 07/03/15  5:41 PM  Result Value Ref Range   Alcohol, Ethyl (B) <5 <5 mg/dL    Comment:        LOWEST DETECTABLE LIMIT FOR SERUM ALCOHOL IS 5 mg/dL FOR MEDICAL PURPOSES ONLY   CBC     Status: Abnormal   Collection Time: 07/03/15  5:41 PM  Result Value Ref Range   WBC 6.8 4.0 - 10.5 K/uL   RBC 5.10 3.87 - 5.11 MIL/uL   Hemoglobin 15.1 (H) 12.0 - 15.0 g/dL   HCT 43.2 36.0 - 46.0 %   MCV 84.7 78.0 - 100.0 fL   MCH 29.6 26.0 - 34.0 pg   MCHC 35.0 30.0 - 36.0 g/dL   RDW 13.1 11.5 - 15.5 %   Platelets 194 150 - 400 K/uL  Valproic acid level     Status: Abnormal   Collection Time: 07/03/15  5:41 PM  Result Value Ref Range   Valproic Acid Lvl 112 (H) 50.0 - 100.0 ug/mL  I-Stat beta hCG blood, ED ([facility], [facility], [facility] only)     Status: None   Collection Time: 07/03/15  6:02 PM  Result Value Ref Range   I-stat hCG, quantitative <5.0 <5 mIU/mL   Comment 3            Comment:   GEST. AGE      CONC.  (mIU/mL)   <=1 WEEK        5 - 50     2 WEEKS  50 - 500     3 WEEKS       100 - 10,000     4 WEEKS     1,000 - 30,000        FEMALE AND NON-PREGNANT FEMALE:     LESS THAN 5  mIU/mL   Urine rapid drug screen (hosp performed) (Not at [facility])     Status: None   Collection Time: 07/03/15  9:28 PM  Result Value Ref Range   Opiates NONE DETECTED NONE DETECTED   Cocaine NONE DETECTED NONE DETECTED   Benzodiazepines NONE DETECTED NONE DETECTED   Amphetamines NONE DETECTED NONE DETECTED   Tetrahydrocannabinol NONE DETECTED NONE DETECTED   Barbiturates NONE DETECTED NONE DETECTED    Comment:        DRUG SCREEN FOR MEDICAL PURPOSES ONLY.  IF CONFIRMATION IS NEEDED FOR ANY PURPOSE, NOTIFY LAB WITHIN 5 DAYS.        LOWEST DETECTABLE LIMITS FOR URINE DRUG SCREEN Drug Class       Cutoff (ng/mL) Amphetamine      1000 Barbiturate      200 Benzodiazepine   793 Tricyclics       903 Opiates          300 Cocaine          300 THC              50     Vitals: Blood pressure 156/80, pulse 118, temperature 97.9 F (36.6 C), temperature source Oral, resp. rate 20, SpO2 100 %.  Risk to Self: Suicidal Ideation: No Suicidal Intent: No Is patient at risk for suicide?: No Suicidal Plan?: No Access to Means: No What has been your use of drugs/alcohol within the last 12 months?: Pt denies How many times?: 1 Other Self Harm Risks: Pt is disorganized and not oriented Triggers for Past Attempts: Unpredictable Intentional Self Injurious Behavior: None Risk to Others: Homicidal Ideation: No Thoughts of Harm to Others: No Current Homicidal Intent: No Current Homicidal Plan: No Access to Homicidal Means: No Identified Victim: NA History of harm to others?: No Assessment of Violence: None Noted Violent Behavior Description: No know history of violence Does patient have access to weapons?: No Criminal Charges Pending?: No Does patient have a court date: No Prior Inpatient Therapy: Prior Inpatient Therapy: Yes Prior Therapy Dates: unknown Prior Therapy Facilty/Provider(s): [facility]  Reason for Treatment: SI, non-compliance with medication Prior Outpatient  Therapy: Prior Outpatient Therapy: Yes Prior Therapy Dates: Current  Prior Therapy Facilty/Provider(s): [name]  Reason for Treatment: Bipolar  Does patient have an ACCT team?: No Does patient have Intensive In-House Services?  : No Does patient have [name] services? : No Does patient have [facility] services?: Unknown  Current Facility-Administered Medications  Medication Dose Route Frequency Provider Last Rate Last Dose  . acetaminophen (TYLENOL) tablet 650 mg  650 mg Oral Q4H PRN Dalia Heading, PA-C      . alum & mag hydroxide-simeth (MAALOX/MYLANTA) 200-200-20 MG/5ML suspension 30 mL  30 mL Oral PRN Dalia Heading, PA-C      . amLODipine (NORVASC) tablet 5 mg  5 mg Oral Daily Dalia Heading, PA-C   5 mg at 07/04/15 1041  . clonazePAM (KLONOPIN) tablet 0.5 mg  0.5 mg Oral BID Dalia Heading, PA-C   0.5 mg at 07/04/15 1044  . divalproex (DEPAKOTE) DR tablet 500 mg  500 mg Oral Q12H Christopher Lawyer, PA-C   500 mg at 07/04/15 1044  . ibuprofen (ADVIL,MOTRIN) tablet  600 mg  600 mg Oral Q8H PRN Dalia Heading, PA-C      . LORazepam (ATIVAN) tablet 1 mg  1 mg Oral Q8H PRN Dalia Heading, PA-C      . multivitamin with minerals tablet 1 tablet  1 tablet Oral BID Dalia Heading, PA-C   1 tablet at 07/04/15 1041  . ondansetron (ZOFRAN) tablet 4 mg  4 mg Oral Q8H PRN Dalia Heading, PA-C       Current Outpatient Prescriptions  Medication Sig Dispense Refill  . amLODipine (NORVASC) 5 MG tablet Take 1 tablet (5 mg total) by mouth daily. 30 tablet 0  . clonazePAM (KLONOPIN) 0.5 MG tablet Take 1 tablet (0.5 mg total) by mouth 2 (two) times daily. 20 tablet 0  . divalproex (DEPAKOTE) 500 MG DR tablet Take 1 tablet (500 mg total) by mouth every 12 (twelve) hours. 60 tablet 0  . Multiple Vitamin (MULTIVITAMIN) capsule Take 1 capsule by mouth 2 (two) times daily.       Musculoskeletal: Strength & Muscle Tone: within normal limits Gait & Station:  normal Patient leans: N/A  Psychiatric Specialty Exam: Physical Exam  Review of Systems  Unable to perform ROS: mental acuity    Blood pressure 156/80, pulse 118, temperature 97.9 F (36.6 C), temperature source Oral, resp. rate 20, SpO2 100 %.There is no weight on file to calculate BMI.  General Appearance: Casual  Eye Contact::  Good  Speech:  Pressured  Volume:  Normal  Mood:  Anxious and Euphoric  Affect:  Labile  Thought Process:  Circumstantial, Disorganized and Tangential  Orientation:  Full (Time, Place, and Person)  Thought Content:  WDL  Suicidal Thoughts:  No  Homicidal Thoughts:  No  Memory:  Immediate;   Poor Recent;   Poor Remote;   Poor  Judgement:  Impaired  Insight:  Shallow  Psychomotor Activity:  Normal  Concentration:  Poor  Recall:  Poor  Fund of Knowledge:Poor  Language: Fair  Akathisia:  NA  Handed:  Right  AIMS (if indicated):     Assets:  Others:  Unable to communicate need due to mental acuity of illness  ADL's:  Intact  Cognition: Impaired,  Severe  Sleep:      Medical Decision Making: Established Problem, Worsening (2), Review of Medication Regimen & Side Effects (2) and Review of New Medication or Change in Dosage (2)  Treatment Plan Summary: Daily contact with patient to assess and evaluate symptoms and progress in treatment and Medication management  Plan:  Resume all home medications.  We will start Olanzapine 5 mg po bid for mood control, Offer Ativan 1 mg po every 8 hours as needed for agitation.   We will repeat Depakote level on Wednesday. Disposition: [facility]   PMHNP-BC 07/04/2015 5:07 PM  Patient seen face-to-face for psychiatric consultation and evaluation in [facility] long emergency department, case discussed with the treatment team and physician extender. Patient presented with severe, uncontrollable manic symptoms including racing thoughts, pressured speech, irritability and agitation. Patient also has a bizarre  behaviors during this evaluation and fine tremors on both upper extremities. Patient valproic acid level is 112 which indicates patient has been compliant with medication and monitor for the toxic symptoms. Reviewed the information documented and agree with the treatment plan.  Deborrah Mabin,JANARDHAHA R. 07/04/2015 6:04 PM

## 2015-07-04 NOTE — [facility] Counselor (Signed)
Writer woke up pt to assess her. Pt is cooperative and pleasant. She is oriented to person, place and time. Pt's speech is tangential. She denies SI and HI. She denies [facility]. Pt talks about a family reunion she coordinated but didn't attend. Then she switches and speaks about working with her father.   Arnold Long, [place] Therapeutic Triage Specialist

## 2015-07-04 NOTE — ED Notes (Signed)
Patient presents with mildly pressured speech and derealization.  Requesting d/c.  Plan of care reviewed.  15' checks cont for safety.

## 2015-07-05 DIAGNOSIS — F3113 Bipolar disorder, current episode manic without psychotic features, severe: Secondary | ICD-10-CM | POA: Diagnosis not present

## 2015-07-05 MED ORDER — DOCUSATE SODIUM 100 MG PO CAPS
100.0000 mg | ORAL_CAPSULE | Freq: Two times a day (BID) | ORAL | Status: DC | PRN
  Administered 2015-07-05: 100 mg via ORAL
  Filled 2015-07-05 (×2): qty 1

## 2015-07-05 MED ORDER — OLANZAPINE 10 MG PO TABS: 10.0000 mg | ORAL_TABLET | Freq: Every day | ORAL | Status: DC

## 2015-07-05 MED ORDER — OLANZAPINE 5 MG PO TBDP: 5.0000 mg | ORAL_TABLET | Freq: Every day | ORAL | Status: DC

## 2015-07-05 NOTE — ED Notes (Signed)
Patient discharged to [facility].  Left the unit ambulatory with [facility].  She has been pleasant and cooperative throughout the shift.  All belongings given to the [facility].

## 2015-07-05 NOTE — Consult Note (Signed)
[facility] Consult   Reason for Consult: Exacerbation of Bipolar disorder, mania Referring Physician:  EDP Patient Identification: Paula Dickson MRN:  [id number] Principal Diagnosis: Bipolar disorder with severe mania Diagnosis:   Patient Active Problem List   Diagnosis Date Noted  . Bipolar disorder with severe mania [F31.13] 06/14/2015    Priority: High  . Acute renal failure syndrome [N17.9]   . Hypokalemia [E87.6] 06/13/2015  . Acute psychosis [F29] 06/13/2015  . Hyponatremia [E87.1] 06/13/2015  . Hypertension, uncontrolled [I10] 06/13/2015  . HTN (hypertension) [I10] 04/02/2014  . Manic depression [F31.9] 04/02/2014    Total Time spent with patient: 30 minutes  Subjective:   Paula Dickson is a 49 y.o. female patient admitted with Exacerbation of Bipolar disorder, mania.  HPI: On admission:  AA female, 49 years old was evaluated for irritability, agitation and manic behavior.  Patient had tangential speech with flight of ideas.  She could not engage in the interview..  She asked providers to close their eyes for a prayer before speaking.  Patient could not state why she came to the hospital.  She stated "the Police keeps coming to my house.  I love my dad and who does not love their dad"  Her speech was pressured and she was playing with her breakfast.  She stopped answering questions and repeated stated that she love her dad.   Patient was seen and evaluated her in the ER last month and was sent home to be seen by her outpatient Psychiatrist for medication management.  Per family patient is compliant with her medications.  We have accepted patient for admission and we will be seeking placement. Today:  The patient remains with pressured speech, tangential and disorganized though processes.  Hyper-religiosity is also prevalent.  Denies suicidal/homicidal ideations, poor historian with little information obtained.  HPI Elements:   Location:  Exacerbation of Bipolar  disorder, manic type, agitataion, . Quality:  severe, pressured speech, disorganized speech, agitataion, . Severity:  severe. Timing:  acute. Duration:  Chronic mental illness. Context:  Brought in by EMS for agitataion and manic symptoms..  Past Medical History:  Past Medical History  Diagnosis Date  . History of arthritis   . History of chicken pox   . History of depression   . History of genital warts   . history of heart murmur   . History of high blood pressure   . History of thyroid disease   . History of UTI   . Bipolar affective disorder   . Hypertension     Past Surgical History  Procedure Laterality Date  . Cyst removal neck      around 11 years ago /benign  . Multiple tooth extractions    . Ablation on endometriosis     Family History:  Family History  Problem Relation Age of Onset  . Alcohol abuse Paternal Uncle   . Alcohol abuse Paternal Grandfather   . Arthritis Father   . Hyperlipidemia Father   . High blood pressure Father   . Breast cancer Maternal Aunt   . Breast cancer Paternal Aunt   . High blood pressure Sister   . Diabetes Sister   . Diabetes Mother   . Mental illness Other     runs in family  . Diabetes Brother   . Mental illness Brother    Social History:  History  Alcohol Use No     History  Drug Use No    History   Social History  .  Marital Status: Single    Spouse Name: N/A  . Number of Children: N/A  . Years of Education: N/A   Social History Main Topics  . Smoking status: Never Smoker   . Smokeless tobacco: Never Used  . Alcohol Use: No  . Drug Use: No  . Sexual Activity: Not Currently   Other Topics Concern  . None   Social History Narrative   Additional Social History:    Pain Medications: None Prescriptions: See MAR Over the Counter: See MAR History of alcohol / drug use?: No history of alcohol / drug abuse Longest period of sobriety (when/how long): NA                     Allergies:  No Known  Allergies  Labs:  Results for orders placed or performed during the hospital encounter of 07/03/15 (from the past 48 hour(s))  Comprehensive metabolic panel     Status: Abnormal   Collection Time: 07/03/15  5:41 PM  Result Value Ref Range   Sodium 142 135 - 145 mmol/L   Potassium 3.3 (L) 3.5 - 5.1 mmol/L   Chloride 103 101 - 111 mmol/L   CO2 27 22 - 32 mmol/L   Glucose, Bld 114 (H) 65 - 99 mg/dL   BUN 11 6 - 20 mg/dL   Creatinine, Ser 1.18 (H) 0.44 - 1.00 mg/dL   Calcium 10.7 (H) 8.9 - 10.3 mg/dL   Total Protein 8.0 6.5 - 8.1 g/dL   Albumin 4.6 3.5 - 5.0 g/dL   AST 22 15 - 41 U/L   ALT 19 14 - 54 U/L   Alkaline Phosphatase 49 38 - 126 U/L   Total Bilirubin 0.5 0.3 - 1.2 mg/dL   GFR calc non Af Amer 52 (L) >60 mL/min   GFR calc Af Amer >60 >60 mL/min    Comment: (NOTE) The eGFR has been calculated using the CKD EPI equation. This calculation has not been validated in all clinical situations. eGFR's persistently <60 mL/min signify possible Chronic Kidney Disease.    Anion gap 12 5 - 15  Ethanol (ETOH)     Status: None   Collection Time: 07/03/15  5:41 PM  Result Value Ref Range   Alcohol, Ethyl (B) <5 <5 mg/dL    Comment:        LOWEST DETECTABLE LIMIT FOR SERUM ALCOHOL IS 5 mg/dL FOR MEDICAL PURPOSES ONLY   CBC     Status: Abnormal   Collection Time: 07/03/15  5:41 PM  Result Value Ref Range   WBC 6.8 4.0 - 10.5 K/uL   RBC 5.10 3.87 - 5.11 MIL/uL   Hemoglobin 15.1 (H) 12.0 - 15.0 g/dL   HCT 43.2 36.0 - 46.0 %   MCV 84.7 78.0 - 100.0 fL   MCH 29.6 26.0 - 34.0 pg   MCHC 35.0 30.0 - 36.0 g/dL   RDW 13.1 11.5 - 15.5 %   Platelets 194 150 - 400 K/uL  Valproic acid level     Status: Abnormal   Collection Time: 07/03/15  5:41 PM  Result Value Ref Range   Valproic Acid Lvl 112 (H) 50.0 - 100.0 ug/mL  I-Stat beta hCG blood, ED ([facility], [facility], [facility] only)     Status: None   Collection Time: 07/03/15  6:02 PM  Result Value Ref Range   I-stat hCG, quantitative <5.0 <5 mIU/mL    Comment 3            Comment:  GEST. AGE      CONC.  (mIU/mL)   <=1 WEEK        5 - 50     2 WEEKS       50 - 500     3 WEEKS       100 - 10,000     4 WEEKS     1,000 - 30,000        FEMALE AND NON-PREGNANT FEMALE:     LESS THAN 5 mIU/mL   Urine rapid drug screen (hosp performed) (Not at [facility])     Status: None   Collection Time: 07/03/15  9:28 PM  Result Value Ref Range   Opiates NONE DETECTED NONE DETECTED   Cocaine NONE DETECTED NONE DETECTED   Benzodiazepines NONE DETECTED NONE DETECTED   Amphetamines NONE DETECTED NONE DETECTED   Tetrahydrocannabinol NONE DETECTED NONE DETECTED   Barbiturates NONE DETECTED NONE DETECTED    Comment:        DRUG SCREEN FOR MEDICAL PURPOSES ONLY.  IF CONFIRMATION IS NEEDED FOR ANY PURPOSE, NOTIFY LAB WITHIN 5 DAYS.        LOWEST DETECTABLE LIMITS FOR URINE DRUG SCREEN Drug Class       Cutoff (ng/mL) Amphetamine      1000 Barbiturate      200 Benzodiazepine   063 Tricyclics       016 Opiates          300 Cocaine          300 THC              50     Vitals: Blood pressure 143/86, pulse 91, temperature 98.2 F (36.8 C), temperature source Oral, resp. rate 18, SpO2 100 %.  Risk to Self: Suicidal Ideation: No Suicidal Intent: No Is patient at risk for suicide?: No Suicidal Plan?: No Access to Means: No What has been your use of drugs/alcohol within the last 12 months?: Pt denies How many times?: 1 Other Self Harm Risks: Pt is disorganized and not oriented Triggers for Past Attempts: Unpredictable Intentional Self Injurious Behavior: None Risk to Others: Homicidal Ideation: No Thoughts of Harm to Others: No Current Homicidal Intent: No Current Homicidal Plan: No Access to Homicidal Means: No Identified Victim: NA History of harm to others?: No Assessment of Violence: None Noted Violent Behavior Description: No know history of violence Does patient have access to weapons?: No Criminal Charges Pending?: No Does patient have a  court date: No Prior Inpatient Therapy: Prior Inpatient Therapy: Yes Prior Therapy Dates: unknown Prior Therapy Facilty/Provider(s): [facility]  Reason for Treatment: SI, non-compliance with medication Prior Outpatient Therapy: Prior Outpatient Therapy: Yes Prior Therapy Dates: Current  Prior Therapy Facilty/Provider(s): [name]  Reason for Treatment: Bipolar  Does patient have an ACCT team?: No Does patient have Intensive In-House Services?  : No Does patient have [name] services? : No Does patient have [facility] services?: Unknown  Current Facility-Administered Medications  Medication Dose Route Frequency Provider Last Rate Last Dose  . acetaminophen (TYLENOL) tablet 650 mg  650 mg Oral Q4H PRN Dalia Heading, PA-C      . alum & mag hydroxide-simeth (MAALOX/MYLANTA) 200-200-20 MG/5ML suspension 30 mL  30 mL Oral PRN Dalia Heading, PA-C      . amLODipine (NORVASC) tablet 5 mg  5 mg Oral Daily Dalia Heading, PA-C   5 mg at 07/05/15 0920  . clonazePAM (KLONOPIN) tablet 0.5 mg  0.5 mg Oral BID Dalia Heading, PA-C  0.5 mg at 07/05/15 0920  . divalproex (DEPAKOTE) DR tablet 500 mg  500 mg Oral Q12H Christopher Lawyer, PA-C   500 mg at 07/05/15 0920  . docusate sodium (COLACE) capsule 100 mg  100 mg Oral BID PRN Dani Wallner      . ibuprofen (ADVIL,MOTRIN) tablet 600 mg  600 mg Oral Q8H PRN Dalia Heading, PA-C      . LORazepam (ATIVAN) tablet 1 mg  1 mg Oral Q8H PRN Dalia Heading, PA-C      . multivitamin with minerals tablet 1 tablet  1 tablet Oral BID Dalia Heading, PA-C   1 tablet at 07/05/15 0920  . OLANZapine zydis (ZYPREXA) disintegrating tablet 5 mg  5 mg Oral BID Delfin Gant, NP   5 mg at 07/05/15 0924  . ondansetron (ZOFRAN) tablet 4 mg  4 mg Oral Q8H PRN Dalia Heading, PA-C       Current Outpatient Prescriptions  Medication Sig Dispense Refill  . amLODipine (NORVASC) 5 MG tablet Take 1 tablet (5 mg total) by  mouth daily. 30 tablet 0  . clonazePAM (KLONOPIN) 0.5 MG tablet Take 1 tablet (0.5 mg total) by mouth 2 (two) times daily. 20 tablet 0  . divalproex (DEPAKOTE) 500 MG DR tablet Take 1 tablet (500 mg total) by mouth every 12 (twelve) hours. 60 tablet 0  . Multiple Vitamin (MULTIVITAMIN) capsule Take 1 capsule by mouth 2 (two) times daily.       Musculoskeletal: Strength & Muscle Tone: within normal limits Gait & Station: normal Patient leans: N/A  Psychiatric Specialty Exam: Physical Exam  Review of Systems  Unable to perform ROS: mental acuity    Blood pressure 143/86, pulse 91, temperature 98.2 F (36.8 C), temperature source Oral, resp. rate 18, SpO2 100 %.There is no weight on file to calculate BMI.  General Appearance: Casual  Eye Contact::  Good  Speech:  Pressured  Volume:  Normal  Mood:  Anxious and Euphoric  Affect:  Labile  Thought Process:  Circumstantial, Disorganized and Tangential  Orientation:  Full (Time, Place, and Person)  Thought Content:  WDL  Suicidal Thoughts:  No  Homicidal Thoughts:  No  Memory:  Immediate;   Poor Recent;   Poor Remote;   Poor  Judgement:  Impaired  Insight:  Shallow  Psychomotor Activity:  Normal  Concentration:  Poor  Recall:  Poor  Fund of Knowledge:Poor  Language: Fair  Akathisia:  NA  Handed:  Right  AIMS (if indicated):     Assets:  Others:  Unable to communicate need due to mental acuity of illness  ADL's:  Intact  Cognition: Impaired,  Severe  Sleep:      Medical Decision Making: Established Problem, Worsening (2), Review of Medication Regimen & Side Effects (2) and Review of New Medication or Change in Dosage (2)  Treatment Plan Summary: Daily contact with patient to assess and evaluate symptoms and progress in treatment and Medication management: Bipolar affective disorder, manic, severe -Continue Depakote 500 mg BID for mood stabilization -Continue Zyprexa 5 mg po in th am and increase pm dose to 10 mg for mood  stabilization and mania -Continue Ativan 1 mg every 8 hours PRN agitation Crisis management continues Repeat Depakote level on Wednesday  Disposition: Admit to inpatient hospitalization for stabilization  Waylan Boga   PMHNP-BC 07/05/2015 12:24 PM Patient seen face-to-face for psychiatric evaluation, chart reviewed and case discussed with the physician extender and developed treatment plan. Reviewed the information documented and agree with  the treatment plan. Corena Pilgrim, MD

## 2015-07-05 NOTE — [facility] Counselor (Signed)
Accepted to [facility] by [name] report [phone]. Can be transported at 3:30p.   Bedelia Person, M.S., LPCA, [place], [facility] Licensed Professional Counselor Associate  Triage Specialist  [facility]  Therapeutic Triage Services Phone: [phone] Fax: [phone]

## 2015-07-06 NOTE — ED Provider Notes (Signed)
CSN: [id number]     Arrival date & time 07/03/15  1644 History   First MD Initiated Contact with Patient 07/03/15 1706     Chief Complaint  Patient presents with  . Manic Behavior  . Psychiatric Evaluation     (Consider location/radiation/quality/duration/timing/severity/associated sxs/prior Treatment) HPI Patient presents to the emergency department with an manic behavior.  Patient states that she had no wide she is here other than the fact that she feels extreme anxiety patient's family felt that she needed to be admitted for psychiatric evaluation.  The patient denies hallucinations, nausea, vomiting, weakness, dizziness, headache, blurred vision, fever, cough, runny nose, sore throat, or syncope Past Medical History  Diagnosis Date  . History of arthritis   . History of chicken pox   . History of depression   . History of genital warts   . history of heart murmur   . History of high blood pressure   . History of thyroid disease   . History of UTI   . Bipolar affective disorder   . Hypertension    Past Surgical History  Procedure Laterality Date  . Cyst removal neck      around 11 years ago /benign  . Multiple tooth extractions    . Ablation on endometriosis     Family History  Problem Relation Age of Onset  . Alcohol abuse Paternal Uncle   . Alcohol abuse Paternal Grandfather   . Arthritis Father   . Hyperlipidemia Father   . High blood pressure Father   . Breast cancer Maternal Aunt   . Breast cancer Paternal Aunt   . High blood pressure Sister   . Diabetes Sister   . Diabetes Mother   . Mental illness Other     runs in family  . Diabetes Brother   . Mental illness Brother    History  Substance Use Topics  . Smoking status: Never Smoker   . Smokeless tobacco: Never Used  . Alcohol Use: No   OB History    No data available     Review of Systems  All other systems negative except as documented in the HPI. All pertinent positives and negatives as  reviewed in the HPI.  Allergies  Review of patient's allergies indicates no known allergies.  Home Medications   Prior to Admission medications   Medication Sig Start Date End Date Taking? Authorizing Provider  amLODipine (NORVASC) 5 MG tablet Take 1 tablet (5 mg total) by mouth daily. 06/16/15   Robbie Lis, MD  clonazePAM (KLONOPIN) 0.5 MG tablet Take 1 tablet (0.5 mg total) by mouth 2 (two) times daily. 06/16/15   Robbie Lis, MD  divalproex (DEPAKOTE) 500 MG DR tablet Take 1 tablet (500 mg total) by mouth every 12 (twelve) hours. 06/16/15   Robbie Lis, MD  Multiple Vitamin (MULTIVITAMIN) capsule Take 1 capsule by mouth 2 (two) times daily.     Historical Provider, MD   BP 133/84 mmHg  Pulse 117  Temp(Src) 98.1 F (36.7 C) (Oral)  Resp 16  SpO2 99% Physical Exam  Constitutional: She is oriented to person, place, and time. She appears well-developed and well-nourished. No distress.  HENT:  Head: Normocephalic and atraumatic.  Mouth/Throat: Oropharynx is clear and moist.  Eyes: Pupils are equal, round, and reactive to light.  Neck: Normal range of motion. Neck supple.  Cardiovascular: Normal rate, regular rhythm and normal heart sounds.  Exam reveals no gallop and no friction rub.   No murmur  heard. Pulmonary/Chest: Effort normal and breath sounds normal. No respiratory distress.  Neurological: She is alert and oriented to person, place, and time. She exhibits normal muscle tone. Coordination normal.  Skin: Skin is warm and dry. No rash noted. No erythema.  Psychiatric: Her behavior is normal. Her mood appears anxious. Thought content is not paranoid and not delusional. She expresses no homicidal plans.  Nursing note and vitals reviewed.   ED Course  Procedures (including critical care time) Labs Review Labs Reviewed  COMPREHENSIVE METABOLIC PANEL - Abnormal; Notable for the following:    Potassium 3.3 (*)    Glucose, Bld 114 (*)    Creatinine, Ser 1.18 (*)    Calcium  10.7 (*)    GFR calc non Af Amer 52 (*)    All other components within normal limits  CBC - Abnormal; Notable for the following:    Hemoglobin 15.1 (*)    All other components within normal limits  VALPROIC ACID LEVEL - Abnormal; Notable for the following:    Valproic Acid Lvl 112 (*)    All other components within normal limits  ETHANOL  URINE RAPID DRUG SCREEN, HOSP PERFORMED  I-STAT BETA HCG BLOOD, ED ([facility], [facility], [facility] ONLY)   Patient will need TTS assessment   Dalia Heading, PA-C 07/06/15 [place], DO 07/09/15 0139

## 2015-07-08 ENCOUNTER — Telehealth: Payer: Self-pay

## 2015-07-08 NOTE — Telephone Encounter (Signed)
PLEASE NOTE: All timestamps contained within this report are represented as [place] Standard Time. CONFIDENTIALTY NOTICE: This fax transmission is intended only for the addressee. It contains information that is legally privileged, confidential or otherwise protected from use or disclosure. If you are not the intended recipient, you are strictly prohibited from reviewing, disclosing, copying using or disseminating any of this information or taking any action in reliance on or regarding this information. If you have received this fax in error, please notify us immediately by telephone so that we can arrange for its return to [place]. Phone: [phone], Toll-Free: [phone], Fax: [phone] Page: 1 of 2 Call Id: [id number] [place] Patient Name: Paula Dickson Gender: Female DOB: 1966/01/08 Age: 49 Y 37 M 15 D Return Phone Number: [phone] (Primary) Address: City/State/Zip: [place] Client [place] Night - Client Client Site [place] Physician Arnette Norris Contact Type Call Call Type Triage / Clinical Relationship To Patient Self Return Phone Number [phone] (Primary) Chief Complaint Strange, Abnormal, or Paranoid Behavior Initial Comment Caller states she is at a behavioral health facility and she feels like she is not at the correct location. She feels like she is not treated correctly at the facility. PreDisposition Did not know what to do Nurse Assessment Nurse: Lyndel Safe, RN, April Date/Time Eilene Ghazi Time): 07/07/2015 6:15:46 PM Confirm and document reason for call. If symptomatic, describe symptoms. ---Caller states she is at a behavioral health facility and she feels like she is not at the correct location. She feels like she is not treated correctly at the facility. Caller is further away from her family. and doesn't feel like  she needs to be so far from home. Caller states she has been here 2 days and no family has visited her there. Has the patient traveled out of the country within the last 30 days? ---No Does the patient require triage? ---Yes Related visit to physician within the last 2 weeks? ---Yes Does the PT have any chronic conditions? (i.e. diabetes, asthma, etc.) ---Yes List chronic conditions. ---high blood pressure bi polar Did the patient indicate they were pregnant? ---No Guidelines Guideline Title Affirmed Question Affirmed Notes Nurse Date/Time (Eastern Time) Bipolar Disorder (Manic Depression) Requesting to talk with a counselor (Air traffic controller, psychiatrist, etc.) Bishop, RN, April 07/07/2015 6:26:56 PM Disp. Time Eilene Ghazi Time) Disposition Final User 07/07/2015 6:41:26 PM See PCP When Office is Open (within 3 days) Bishop, RN, April 07/07/2015 6:45:41 PM Clinical Call Yes Bishop, RN, April PLEASE NOTE: All timestamps contained within this report are represented as [place] Standard Time. CONFIDENTIALTY NOTICE: This fax transmission is intended only for the addressee. It contains information that is legally privileged, confidential or otherwise protected from use or disclosure. If you are not the intended recipient, you are strictly prohibited from reviewing, disclosing, copying using or disseminating any of this information or taking any action in reliance on or regarding this information. If you have received this fax in error, please notify us immediately by telephone so that we can arrange for its return to [place]. Phone: [phone], Toll-Free: [phone], Fax: [phone] Page: 2 of 2 Call Id: [id number] [place] Understands: No Disagree/Comply: Disagree Disagree/Comply Reason: Disagree with instructions Care Advice Given Per Guideline ALTERNATE DISPOSITION - [place] 3 DAYS: If patient has a Lexicographer, psychologist or counselor,  recommend the caller speak with this mental health professional within the next  3 days. * If available, local mental health program: [id number]. * If available, local psychiatric crisis service at _______ hospital: [id number]. After Care Instructions Given Call Event Type User Date / Time Description Comments User: April, Bishop, RN Date/Time Eilene Ghazi Time): 07/07/2015 6:45:18 PM called back to the facility and spoke to Mickel Baas at [phone] and she stated the patient has been speaking to Counselors today and that she is in patient at the facility. Advised them that the caller was requesting to speak to a counselor and Mickel Baas states they will take care of it for her.

## 2015-07-08 NOTE — Telephone Encounter (Signed)
This should be dealt with by the facility that she is at

## 2015-07-09 ENCOUNTER — Ambulatory Visit (HOSPITAL_COMMUNITY): Payer: 59

## 2015-07-23 ENCOUNTER — Telehealth: Payer: Self-pay

## 2015-07-23 NOTE — Telephone Encounter (Signed)
Pt called with questions about meds that were given to her upon discharge at [facility] detox center on 07/22/15. Pt does not know why or how she was admitted to [facility] detox center.  Pt said she was seen at [facility] ED first of Aug and then transferred to the detox center. Upon discharge from detox center pt was given rx for seroquil, risperdal and depakote DR. Pt wants to know if she should be taking these meds or not. Advised pt she should contact psychiatry. Pt has left message at [facility] psychiat[facility] for cb. Pt request this message sent to Dr Deborra Medina as Juluis Rainier.

## 2015-07-24 ENCOUNTER — Emergency Department (HOSPITAL_COMMUNITY): Admission: EM | Admit: 2015-07-24 | Discharge: 2015-07-24 | Discharge status: Absent without leave | Payer: 59

## 2015-07-24 ENCOUNTER — Encounter (HOSPITAL_COMMUNITY): Payer: Self-pay | Admitting: Emergency Medicine

## 2015-07-24 ENCOUNTER — Emergency Department (HOSPITAL_COMMUNITY)
Admission: EM | Admit: 2015-07-24 | Discharge: 2015-07-25 | Disposition: A | Discharge status: Other Acute Inpatient Hospital | Payer: 59 | Attending: Emergency Medicine | Admitting: Emergency Medicine

## 2015-07-24 DIAGNOSIS — Z87440 Personal history of urinary (tract) infections: Secondary | ICD-10-CM | POA: Insufficient documentation

## 2015-07-24 DIAGNOSIS — F3113 Bipolar disorder, current episode manic without psychotic features, severe: Secondary | ICD-10-CM | POA: Diagnosis not present

## 2015-07-24 DIAGNOSIS — Z79899 Other long term (current) drug therapy: Secondary | ICD-10-CM | POA: Diagnosis not present

## 2015-07-24 DIAGNOSIS — Z8739 Personal history of other diseases of the musculoskeletal system and connective tissue: Secondary | ICD-10-CM | POA: Diagnosis not present

## 2015-07-24 DIAGNOSIS — I10 Essential (primary) hypertension: Secondary | ICD-10-CM | POA: Diagnosis not present

## 2015-07-24 DIAGNOSIS — Z8639 Personal history of other endocrine, nutritional and metabolic disease: Secondary | ICD-10-CM | POA: Insufficient documentation

## 2015-07-24 DIAGNOSIS — Z8619 Personal history of other infectious and parasitic diseases: Secondary | ICD-10-CM | POA: Diagnosis not present

## 2015-07-24 DIAGNOSIS — F312 Bipolar disorder, current episode manic severe with psychotic features: Secondary | ICD-10-CM | POA: Diagnosis present

## 2015-07-24 DIAGNOSIS — Z008 Encounter for other general examination: Secondary | ICD-10-CM | POA: Diagnosis present

## 2015-07-24 DIAGNOSIS — R011 Cardiac murmur, unspecified: Secondary | ICD-10-CM | POA: Diagnosis not present

## 2015-07-24 LAB — COMPREHENSIVE METABOLIC PANEL
ALT: 14 U/L (ref 14–54)
AST: 17 U/L (ref 15–41)
Albumin: 4.4 g/dL (ref 3.5–5.0)
Alkaline Phosphatase: 52 U/L (ref 38–126)
Anion gap: 9 (ref 5–15)
BUN: 11 mg/dL (ref 6–20)
CO2: 26 mmol/L (ref 22–32)
Calcium: 9.7 mg/dL (ref 8.9–10.3)
Chloride: 104 mmol/L (ref 101–111)
Creatinine, Ser: 0.98 mg/dL (ref 0.44–1.00)
GFR calc Af Amer: >60 mL/min (ref >60)
GFR calc non Af Amer: >60 mL/min (ref >60)
Glucose, Bld: 122 mg/dL — ABNORMAL HIGH (ref 65–99)
Potassium: 3.3 mmol/L — ABNORMAL LOW (ref 3.5–5.1)
Sodium: 139 mmol/L (ref 135–145)
Total Bilirubin: 0.8 mg/dL (ref 0.3–1.2)
Total Protein: 7.4 g/dL (ref 6.5–8.1)

## 2015-07-24 LAB — CBC
HCT: 41.1 % (ref 36.0–46.0)
Hemoglobin: 13.9 g/dL (ref 12.0–15.0)
MCH: 28.6 pg (ref 26.0–34.0)
MCHC: 33.8 g/dL (ref 30.0–36.0)
MCV: 84.6 fL (ref 78.0–100.0)
Platelets: 200 10*3/uL (ref 150–400)
RBC: 4.86 MIL/uL (ref 3.87–5.11)
RDW: 13.6 % (ref 11.5–15.5)
WBC: 7.4 10*3/uL (ref 4.0–10.5)

## 2015-07-24 LAB — RAPID URINE DRUG SCREEN, HOSP PERFORMED
Amphetamines: NOT DETECTED
Barbiturates: NOT DETECTED
Benzodiazepines: NOT DETECTED
Cocaine: NOT DETECTED
Opiates: NOT DETECTED
Tetrahydrocannabinol: NOT DETECTED

## 2015-07-24 LAB — ACETAMINOPHEN LEVEL: Acetaminophen (Tylenol), Serum: <10 ug/mL — ABNORMAL LOW (ref 10–30)

## 2015-07-24 LAB — ETHANOL: Alcohol, Ethyl (B): <5 mg/dL (ref <5)

## 2015-07-24 LAB — SALICYLATE LEVEL: Salicylate Lvl: <4 mg/dL (ref 2.8–30.0)

## 2015-07-24 MED ORDER — QUETIAPINE FUMARATE 100 MG PO TABS
100.0000 mg | ORAL_TABLET | Freq: Every day | ORAL | Status: DC
Start: 2015-07-24 — End: 2015-07-25
  Administered 2015-07-24: 100 mg via ORAL
  Filled 2015-07-24: qty 1

## 2015-07-24 MED ORDER — BENZTROPINE MESYLATE 1 MG PO TABS
0.5000 mg | ORAL_TABLET | Freq: Two times a day (BID) | ORAL | Status: DC
Start: 2015-07-24 — End: 2015-07-25
  Administered 2015-07-24 – 2015-07-25 (×3): 0.5 mg via ORAL
  Filled 2015-07-24 (×3): qty 1

## 2015-07-24 MED ORDER — QUETIAPINE FUMARATE 25 MG PO TABS: 25.0000 mg | ORAL_TABLET | Freq: Three times a day (TID) | ORAL | Status: DC

## 2015-07-24 MED ORDER — CLONAZEPAM 0.5 MG PO TABS
0.5000 mg | ORAL_TABLET | Freq: Once | ORAL | Status: AC
  Administered 2015-07-24: 0.5 mg via ORAL
  Filled 2015-07-24: qty 1

## 2015-07-24 MED ORDER — TRAZODONE HCL 50 MG PO TABS
50.0000 mg | ORAL_TABLET | Freq: Every evening | ORAL | Status: DC | PRN
  Administered 2015-07-24: 50 mg via ORAL
  Filled 2015-07-24: qty 1

## 2015-07-24 MED ORDER — DIVALPROEX SODIUM 500 MG PO DR TAB
500.0000 mg | DELAYED_RELEASE_TABLET | Freq: Two times a day (BID) | ORAL | Status: DC
  Administered 2015-07-24 – 2015-07-25 (×3): 500 mg via ORAL
  Filled 2015-07-24 (×3): qty 1

## 2015-07-24 MED ORDER — AMLODIPINE BESYLATE 5 MG PO TABS
5.0000 mg | ORAL_TABLET | Freq: Every day | ORAL | Status: DC
  Administered 2015-07-24 – 2015-07-25 (×2): 5 mg via ORAL
  Filled 2015-07-24 (×2): qty 1

## 2015-07-24 NOTE — ED Provider Notes (Signed)
CSN: [id number]     Arrival date & time 07/24/15  0326 History   First MD Initiated Contact with Patient 07/24/15 0459     Chief Complaint  Patient presents with  . Medical Clearance     (Consider location/radiation/quality/duration/timing/severity/associated sxs/prior Treatment) HPI Comments: 49 year old female with a history of bipolar affective disorder presents to the emergency department, brought in by family. Family is no longer at bedside. Patient rambling from topic to topic. Thought process is tangential. Patient denies any suicidal or homicidal thoughts. She was released yesterday after a 20 day stay at [facility] for mania. Patient reports that her family is manipulative. She cannot indicate why she is in the emergency department. She states that she did not prompt her visit here.  The history is provided by the patient. No language interpreter was used.    Past Medical History  Diagnosis Date  . History of arthritis   . History of chicken pox   . History of depression   . History of genital warts   . history of heart murmur   . History of high blood pressure   . History of thyroid disease   . History of UTI   . Bipolar affective disorder   . Hypertension    Past Surgical History  Procedure Laterality Date  . Cyst removal neck      around 11 years ago /benign  . Multiple tooth extractions    . Ablation on endometriosis     Family History  Problem Relation Age of Onset  . Alcohol abuse Paternal Uncle   . Alcohol abuse Paternal Grandfather   . Arthritis Father   . Hyperlipidemia Father   . High blood pressure Father   . Breast cancer Maternal Aunt   . Breast cancer Paternal Aunt   . High blood pressure Sister   . Diabetes Sister   . Diabetes Mother   . Mental illness Other     runs in family  . Diabetes Brother   . Mental illness Brother    Social History  Substance Use Topics  . Smoking status: Never Smoker   . Smokeless tobacco: Never Used   . Alcohol Use: No   OB History    No data available      Review of Systems  Psychiatric/Behavioral: Positive for behavioral problems.  All other systems reviewed and are negative.   Allergies  Review of patient's allergies indicates no known allergies.  Home Medications   Prior to Admission medications   Medication Sig Start Date End Date Taking? Authorizing Provider  benztropine (COGENTIN) 0.5 MG tablet Take 0.5 mg by mouth 2 (two) times daily.   Yes Historical Provider, MD  divalproex (DEPAKOTE ER) 250 MG 24 hr tablet 3 at h s 07/22/15  Yes Historical Provider, MD  QUEtiapine (SEROQUEL) 25 MG tablet Take 25 mg by mouth 3 (three) times daily.  07/22/15 08/21/15 Yes Historical Provider, MD  risperiDONE (RISPERDAL) 2 MG tablet Take 2-4 mg by mouth 2 (two) times daily. 2mg  in am 4mg  in the pm   Yes Historical Provider, MD  amLODipine (NORVASC) 5 MG tablet Take 1 tablet (5 mg total) by mouth daily. 06/16/15   Robbie Lis, MD  clonazePAM (KLONOPIN) 0.5 MG tablet Take 1 tablet (0.5 mg total) by mouth 2 (two) times daily. Patient not taking: Reported on 07/24/2015 06/16/15   Robbie Lis, MD  divalproex (DEPAKOTE) 500 MG DR tablet Take 1 tablet (500 mg total) by mouth every  12 (twelve) hours. Patient not taking: Reported on 07/24/2015 06/16/15   Robbie Lis, MD   BP 137/57 mmHg  Pulse 101  Temp(Src) 98.3 F (36.8 C) (Oral)  Resp 18  SpO2 99%   Physical Exam  Constitutional: She is oriented to person, place, and time. She appears well-developed and well-nourished. No distress.  HENT:  Head: Normocephalic and atraumatic.  Eyes: Conjunctivae and EOM are normal. No scleral icterus.  Neck: Normal range of motion.  Pulmonary/Chest: Effort normal. No respiratory distress.  Musculoskeletal: Normal range of motion.  Neurological: She is alert and oriented to person, place, and time. She exhibits normal muscle tone. Coordination normal.  Skin: Skin is warm and dry. No rash noted. She is  not diaphoretic. No erythema. No pallor.  Psychiatric: Her behavior is normal. Her mood appears anxious. Her speech is rapid and/or pressured and tangential. She expresses no homicidal and no suicidal ideation.  Nursing note and vitals reviewed.   ED Course  Procedures (including critical care time) Labs Review Labs Reviewed  COMPREHENSIVE METABOLIC PANEL - Abnormal; Notable for the following:    Potassium 3.3 (*)    Glucose, Bld 122 (*)    All other components within normal limits  ACETAMINOPHEN LEVEL - Abnormal; Notable for the following:    Acetaminophen (Tylenol), Serum <10 (*)    All other components within normal limits  ETHANOL  SALICYLATE LEVEL  CBC  URINE RAPID DRUG SCREEN, HOSP PERFORMED    Imaging Review No results found.   I have personally reviewed and evaluated these images and lab results as part of my medical decision-making.   EKG Interpretation None      MDM   Final diagnoses:  Bipolar affective, manic    49 year old female presents to the emergency department for psychiatric evaluation. Patient has been medically cleared and evaluated by TTS who recommended inpatient placement. Placement is pending at this time. Disposition to be determined by oncoming ED provider.   Filed Vitals:   07/24/15 0344 07/24/15 0631  BP: 147/54 137/57  Pulse: 106 101  Temp: 98.4 F (36.9 C) 98.3 F (36.8 C)  TempSrc: Oral Oral  Resp: 18 18  SpO2: 96% 99%     Antonietta Breach, PA-C 07/24/15 5631  Linton Flemings, MD 07/24/15 2111

## 2015-07-24 NOTE — Progress Notes (Signed)
D Pt. Denies SI and HI, no complaints of pain or discomfort noted at present time.  A Writer offered support and encouragement, discussed medication regiment with pt.    R Pt. Reports she had been off her medications prior to this admission and is feeling better now.  Slight confusion noted but calmer and clearer than when admitted.

## 2015-07-24 NOTE — ED Notes (Signed)
Bed: [facility] Expected date:  Expected time:  Means of arrival:  Comments: t5

## 2015-07-24 NOTE — BHH Counselor (Signed)
TTS counselor faxed out referrals to the following facilities in effort to obtain inpt placement:  [facility]  (Faxed 07/24/15 at 20:30)  [name], [facility] Therapeutic Triage

## 2015-07-24 NOTE — ED Notes (Signed)
Tremors noted with mild cogwheeling.  Provider informed and ordered medicatoin administered. Will assess effectiveness.

## 2015-07-24 NOTE — BH Assessment (Signed)
Assessment completed. Consulted Arlester Marker, NP who recommended inpatient treatment. TTS will seek placement.

## 2015-07-24 NOTE — Progress Notes (Signed)
D Pt. Presents with rapid speech, denies SI and HI, no complaints of pain or discomfort noted at present time.   A Writer offered support and encouragement, R Pt. Remains safe on the unit.

## 2015-07-24 NOTE — ED Notes (Signed)
Pt brought in by sister and brother. PT is manic and rambles from topic to topic. Pt denies SI or HI. Pt reports she was admitted to [facility] for 20 days and was just released. Pt reports her family is manipulative.

## 2015-07-24 NOTE — ED Notes (Signed)
Pt has been wanded by security. 

## 2015-07-24 NOTE — BH Assessment (Addendum)
Tele Assessment Note   Paula Dickson is an 49 y.o. female presenting to [facility] reporting that she feels as if her life is threatened. Pt stated "I feel like my life is threatened because of something I wrote down". Pt would not disclose what she wrote because she feels as if her life is in danger. "I was discharge from [facility] but I think they only discharged me because I fell down". Pt denies SI, HI and AVH at this time. PT thought process is tangential.  Collateral information was gathered from pt's sister who reported that called her crying saying that their father was holding her hostage. She reported that pt has not slept since being discharged at 12:45 on Thursday. She reported that pt has been walking like there is something on her mind. She also reported that pt has been telling everyone that their father called; however pt called her crying and became upset when her sister and brother showed up together at the home to check on her.   Axis I: Bipolar, Manic  Past Medical History:  Past Medical History  Diagnosis Date  . History of arthritis   . History of chicken pox   . History of depression   . History of genital warts   . history of heart murmur   . History of high blood pressure   . History of thyroid disease   . History of UTI   . Bipolar affective disorder   . Hypertension     Past Surgical History  Procedure Laterality Date  . Cyst removal neck      around 11 years ago /benign  . Multiple tooth extractions    . Ablation on endometriosis      Family History:  Family History  Problem Relation Age of Onset  . Alcohol abuse Paternal Uncle   . Alcohol abuse Paternal Grandfather   . Arthritis Father   . Hyperlipidemia Father   . High blood pressure Father   . Breast cancer Maternal Aunt   . Breast cancer Paternal Aunt   . High blood pressure Sister   . Diabetes Sister   . Diabetes Mother   . Mental illness Other     runs in family  . Diabetes Brother   .  Mental illness Brother     Social History:  reports that she has never smoked. She has never used smokeless tobacco. She reports that she does not drink alcohol or use illicit drugs.  Additional Social History:  Alcohol / Drug Use Pain Medications: pt denies abuse  Prescriptions: pt denies abuse  Over the Counter: pt denies abuse  History of alcohol / drug use?: No history of alcohol / drug abuse  CIWA: CIWA-Ar BP: (!) 147/54 mmHg Pulse Rate: 106 COWS:    PATIENT STRENGTHS: (choose at least two) Average or above average intelligence Supportive family/friends  Allergies: No Known Allergies  Home Medications:  (Not in a hospital admission)  OB/GYN Status:  No LMP recorded. Patient is not currently having periods (Reason: Perimenopausal).  General Assessment Data Location of Assessment: [facility] TTS Assessment: In system Is this a Tele or Face-to-Face Assessment?: Face-to-Face Is this an Initial Assessment or a Re-assessment for this encounter?: Initial Assessment Marital status: Single Living Arrangements: Parent (Father) Can pt return to current living arrangement?: Yes Admission Status: Voluntary Is patient capable of signing voluntary admission?: Yes Referral Source: Self/Family/Friend Insurance type: [employer]      Crisis Care Plan Living Arrangements: Parent (Father) Name  of Psychiatrist: Warden/ranger  Name of Therapist: [name]   Education Status Is patient currently in school?: No Current Grade: N/A Highest grade of school patient has completed: N/A Name of school: N/A Contact person: N/A  Risk to self with the past 6 months Suicidal Ideation: No Has patient been a risk to self within the past 6 months prior to admission? : No Suicidal Intent: No Has patient had any suicidal intent within the past 6 months prior to admission? : No Is patient at risk for suicide?: No Suicidal Plan?: No Has patient had any suicidal plan within the past 6 months prior to  admission? : No Access to Means: No What has been your use of drugs/alcohol within the last 12 months?: Pt denies alcohol or drug use.  Previous Attempts/Gestures: Yes How many times?: 1 Other Self Harm Risks: No other self harm risk identified at this time.  Triggers for Past Attempts: Unpredictable Intentional Self Injurious Behavior: None Family Suicide History: Yes ([place]) Recent stressful life event(s):  (Unable to assess ) Persecutory voices/beliefs?: No Depression: No Substance abuse history and/or treatment for substance abuse?: No Suicide prevention information given to non-admitted patients: Not applicable  Risk to Others within the past 6 months Homicidal Ideation: No Does patient have any lifetime risk of violence toward others beyond the six months prior to admission? : No Thoughts of Harm to Others: No Current Homicidal Intent: No Current Homicidal Plan: No Access to Homicidal Means: No Identified Victim: N/A History of harm to others?: No Assessment of Violence: None Noted Violent Behavior Description: No violent behaviors observed. Pt is calm and cooperative at this time.  Does patient have access to weapons?: No Criminal Charges Pending?: No Does patient have a court date: No Is patient on probation?: No  Psychosis Hallucinations: None noted Delusions: None noted  Mental Status Report Appearance/Hygiene: In scrubs Eye Contact: Good Motor Activity: Freedom of movement Speech: Incoherent, Pressured Level of Consciousness: Alert (laughing) Mood: Pleasant Affect: Blunted Anxiety Level: Moderate Thought Processes: Circumstantial Judgement: Impaired Orientation: Not oriented Obsessive Compulsive Thoughts/Behaviors: None  Cognitive Functioning Concentration: Decreased Memory: Recent Intact IQ: Average Insight: Fair Impulse Control: Good Appetite: Good Weight Loss: 0 Weight Gain: 0 Sleep: Unable to Assess Vegetative Symptoms: Unable to  Assess  ADLScreening [facility] Assessment Services) Patient's cognitive ability adequate to safely complete daily activities?: Yes Patient able to express need for assistance with ADLs?: Yes Independently performs ADLs?: Yes (appropriate for developmental age)  Prior Inpatient Therapy Prior Inpatient Therapy: Yes Prior Therapy Dates: unknown, 2016 Prior Therapy Facilty/Provider(s): [facility] , [facility] Reason for Treatment: SI, non-compliance with medication  Prior Outpatient Therapy Prior Outpatient Therapy: Yes Prior Therapy Dates: Current  Prior Therapy Facilty/Provider(s): [name]  Reason for Treatment: Bipolar  Does patient have an ACCT team?: No Does patient have Intensive In-House Services?  : No Does patient have [name] services? : No Does patient have P4[facility] services?: No  ADL Screening (condition at time of admission) Patient's cognitive ability adequate to safely complete daily activities?: Yes Is the patient deaf or have difficulty hearing?: No Does the patient have difficulty seeing, even when wearing glasses/contacts?: No Does the patient have difficulty concentrating, remembering, or making decisions?: No Patient able to express need for assistance with ADLs?: Yes Does the patient have difficulty dressing or bathing?: No Independently performs ADLs?: Yes (appropriate for developmental age)       Abuse/Neglect Assessment (Assessment to be complete while patient is alone) Physical Abuse: Denies  Verbal Abuse: Denies Sexual Abuse: Denies Exploitation of patient/patient's resources: Denies Self-Neglect: Denies     Regulatory affairs officer (For Healthcare) Does patient have an advance directive?: No Would patient like information on creating an advanced directive?: No - patient declined information    Additional Information 1:1 In Past 12 Months?: No CIRT Risk: No Elopement Risk: No Does patient have medical clearance?: Yes      Disposition:  Disposition Initial Assessment Completed for this Encounter: Yes Disposition of Patient: Inpatient treatment program Type of inpatient treatment program: Adult  Marriah Sanderlin S 07/24/2015 6:12 AM

## 2015-07-24 NOTE — Consult Note (Signed)
[facility] Face-to-Face Psychiatry Consult   Reason for Consult: "I feel like my life is threatened because of something I wrote down''. Referring Physician:  EDP Patient Identification: Paula Dickson MRN:  [id number] Principal Diagnosis: Bipolar disorder with severe mania Diagnosis:   Patient Active Problem List   Diagnosis Date Noted  . Bipolar disorder with severe mania [F31.13] 06/14/2015    Priority: High  . Acute renal failure syndrome [N17.9]   . Hypokalemia [E87.6] 06/13/2015  . Acute psychosis [F29] 06/13/2015  . Hyponatremia [E87.1] 06/13/2015  . Hypertension, uncontrolled [I10] 06/13/2015  . HTN (hypertension) [I10] 04/02/2014  . Manic depression [F31.9] 04/02/2014    Total Time spent with patient: 45 minutes  Subjective:   Paula Dickson is a 49 y.o. female patient admitted with manic behavior  HPI: Patient is a 49 year old female with a long  history of bipolar affective disorder who reports being discharged from [facility] yesterday after a 20 day stay and brought to [facility] by her family because she thinks she needs to be back in the hospital. She has become increasingly delusional, saying that her life is in danger. Patient has pressure speech, rambling from topic to topic and tangential. She is requesting to be put back on her medications. Patient denies psychosis, depressive symptoms, mood swings, suicidal/ homicidal thoughts or drug or alcohol abuse. She was released yesterday after a 20 day stay at [facility] for mania. Patient reports that her family is manipulative. She cannot indicate why she is in the emergency department. She states that she did not prompt her visit here.   HPI Elements:   Location:  delusional. Quality:  moderate.  Past Medical History:  Past Medical History  Diagnosis Date  . History of arthritis   . History of chicken pox   . History of depression   . History of genital warts   . history of heart murmur   . History of  high blood pressure   . History of thyroid disease   . History of UTI   . Bipolar affective disorder   . Hypertension     Past Surgical History  Procedure Laterality Date  . Cyst removal neck      around 11 years ago /benign  . Multiple tooth extractions    . Ablation on endometriosis     Family History:  Family History  Problem Relation Age of Onset  . Alcohol abuse Paternal Uncle   . Alcohol abuse Paternal Grandfather   . Arthritis Father   . Hyperlipidemia Father   . High blood pressure Father   . Breast cancer Maternal Aunt   . Breast cancer Paternal Aunt   . High blood pressure Sister   . Diabetes Sister   . Diabetes Mother   . Mental illness Other     runs in family  . Diabetes Brother   . Mental illness Brother    Social History:  History  Alcohol Use No     History  Drug Use No    Social History   Social History  . Marital Status: Single    Spouse Name: N/A  . Number of Children: N/A  . Years of Education: N/A   Social History Main Topics  . Smoking status: Never Smoker   . Smokeless tobacco: Never Used  . Alcohol Use: No  . Drug Use: No  . Sexual Activity: Not Currently   Other Topics Concern  . None   Social History Narrative   Additional  Social History:    Pain Medications: pt denies abuse  Prescriptions: pt denies abuse  Over the Counter: pt denies abuse  History of alcohol / drug use?: No history of alcohol / drug abuse                     Allergies:  No Known Allergies  Labs:  Results for orders placed or performed during the hospital encounter of 07/24/15 (from the past 48 hour(s))  Comprehensive metabolic panel     Status: Abnormal   Collection Time: 07/24/15  4:27 AM  Result Value Ref Range   Sodium 139 135 - 145 mmol/L   Potassium 3.3 (L) 3.5 - 5.1 mmol/L   Chloride 104 101 - 111 mmol/L   CO2 26 22 - 32 mmol/L   Glucose, Bld 122 (H) 65 - 99 mg/dL   BUN 11 6 - 20 mg/dL   Creatinine, Ser 0.98 0.44 - 1.00 mg/dL    Calcium 9.7 8.9 - 10.3 mg/dL   Total Protein 7.4 6.5 - 8.1 g/dL   Albumin 4.4 3.5 - 5.0 g/dL   AST 17 15 - 41 U/L   ALT 14 14 - 54 U/L   Alkaline Phosphatase 52 38 - 126 U/L   Total Bilirubin 0.8 0.3 - 1.2 mg/dL   GFR calc non Af Amer >60 >60 mL/min   GFR calc Af Amer >60 >60 mL/min    Comment: (NOTE) The eGFR has been calculated using the CKD EPI equation. This calculation has not been validated in all clinical situations. eGFR's persistently <60 mL/min signify possible Chronic Kidney Disease.    Anion gap 9 5 - 15  CBC     Status: None   Collection Time: 07/24/15  4:27 AM  Result Value Ref Range   WBC 7.4 4.0 - 10.5 K/uL   RBC 4.86 3.87 - 5.11 MIL/uL   Hemoglobin 13.9 12.0 - 15.0 g/dL   HCT 41.1 36.0 - 46.0 %   MCV 84.6 78.0 - 100.0 fL   MCH 28.6 26.0 - 34.0 pg   MCHC 33.8 30.0 - 36.0 g/dL   RDW 13.6 11.5 - 15.5 %   Platelets 200 150 - 400 K/uL  Ethanol (ETOH)     Status: None   Collection Time: 07/24/15  4:28 AM  Result Value Ref Range   Alcohol, Ethyl (B) <5 <5 mg/dL    Comment:        LOWEST DETECTABLE LIMIT FOR SERUM ALCOHOL IS 5 mg/dL FOR MEDICAL PURPOSES ONLY   Salicylate level     Status: None   Collection Time: 07/24/15  4:28 AM  Result Value Ref Range   Salicylate Lvl <1.0 2.8 - 30.0 mg/dL  Acetaminophen level     Status: Abnormal   Collection Time: 07/24/15  4:28 AM  Result Value Ref Range   Acetaminophen (Tylenol), Serum <10 (L) 10 - 30 ug/mL    Comment:        THERAPEUTIC CONCENTRATIONS VARY SIGNIFICANTLY. A RANGE OF 10-30 ug/mL MAY BE AN EFFECTIVE CONCENTRATION FOR MANY PATIENTS. HOWEVER, SOME ARE BEST TREATED AT CONCENTRATIONS OUTSIDE THIS RANGE. ACETAMINOPHEN CONCENTRATIONS >150 ug/mL AT 4 HOURS AFTER INGESTION AND >50 ug/mL AT 12 HOURS AFTER INGESTION ARE OFTEN ASSOCIATED WITH TOXIC REACTIONS.   Urine rapid drug screen (hosp performed) (Not at [facility])     Status: None   Collection Time: 07/24/15  5:16 AM  Result Value Ref Range   Opiates  NONE DETECTED NONE DETECTED   Cocaine  NONE DETECTED NONE DETECTED   Benzodiazepines NONE DETECTED NONE DETECTED   Amphetamines NONE DETECTED NONE DETECTED   Tetrahydrocannabinol NONE DETECTED NONE DETECTED   Barbiturates NONE DETECTED NONE DETECTED    Comment:        DRUG SCREEN FOR MEDICAL PURPOSES ONLY.  IF CONFIRMATION IS NEEDED FOR ANY PURPOSE, NOTIFY LAB WITHIN 5 DAYS.        LOWEST DETECTABLE LIMITS FOR URINE DRUG SCREEN Drug Class       Cutoff (ng/mL) Amphetamine      1000 Barbiturate      200 Benzodiazepine   628 Tricyclics       315 Opiates          300 Cocaine          300 THC              50     Vitals: Blood pressure 137/57, pulse 101, temperature 98.3 F (36.8 C), temperature source Oral, resp. rate 18, SpO2 99 %.  Risk to Self: Suicidal Ideation: No Suicidal Intent: No Is patient at risk for suicide?: No Suicidal Plan?: No Access to Means: No What has been your use of drugs/alcohol within the last 12 months?: Pt denies alcohol or drug use.  How many times?: 1 Other Self Harm Risks: No other self harm risk identified at this time.  Triggers for Past Attempts: Unpredictable Intentional Self Injurious Behavior: None Risk to Others: Homicidal Ideation: No Thoughts of Harm to Others: No Current Homicidal Intent: No Current Homicidal Plan: No Access to Homicidal Means: No Identified Victim: N/A History of harm to others?: No Assessment of Violence: None Noted Violent Behavior Description: No violent behaviors observed. Pt is calm and cooperative at this time.  Does patient have access to weapons?: No Criminal Charges Pending?: No Does patient have a court date: No Prior Inpatient Therapy: Prior Inpatient Therapy: Yes Prior Therapy Dates: unknown, 2016 Prior Therapy Facilty/Provider(s): [facility] Reason for Treatment: SI, non-compliance with medication Prior Outpatient Therapy: Prior Outpatient Therapy: Yes Prior Therapy Dates: Current   Prior Therapy Facilty/Provider(s): [name]  Reason for Treatment: Bipolar  Does patient have an ACCT team?: No Does patient have Intensive In-House Services?  : No Does patient have [name] services? : No Does patient have [facility] services?: No  Current Facility-Administered Medications  Medication Dose Route Frequency Provider Last Rate Last Dose  . amLODipine (NORVASC) tablet 5 mg  5 mg Oral Daily Patrecia Pour, NP   5 mg at 07/24/15 1119  . benztropine (COGENTIN) tablet 0.5 mg  0.5 mg Oral BID Patrecia Pour, NP   0.5 mg at 07/24/15 1119  . divalproex (DEPAKOTE) DR tablet 500 mg  500 mg Oral Q12H Vernita Tague   500 mg at 07/24/15 1118  . QUEtiapine (SEROQUEL) tablet 100 mg  100 mg Oral QHS Christyann Manolis      . traZODone (DESYREL) tablet 50 mg  50 mg Oral QHS PRN Ivette Castronova       Current Outpatient Prescriptions  Medication Sig Dispense Refill  . benztropine (COGENTIN) 0.5 MG tablet Take 0.5 mg by mouth 2 (two) times daily.    . divalproex (DEPAKOTE ER) 250 MG 24 hr tablet 3 at h s    . QUEtiapine (SEROQUEL) 25 MG tablet Take 25 mg by mouth 3 (three) times daily.     . risperiDONE (RISPERDAL) 2 MG tablet Take 2-4 mg by mouth 2 (two) times daily. 1m in am 422m  in the pm    . amLODipine (NORVASC) 5 MG tablet Take 1 tablet (5 mg total) by mouth daily. 30 tablet 0  . clonazePAM (KLONOPIN) 0.5 MG tablet Take 1 tablet (0.5 mg total) by mouth 2 (two) times daily. (Patient not taking: Reported on 07/24/2015) 20 tablet 0  . divalproex (DEPAKOTE) 500 MG DR tablet Take 1 tablet (500 mg total) by mouth every 12 (twelve) hours. (Patient not taking: Reported on 07/24/2015) 60 tablet 0    Musculoskeletal: Strength & Muscle Tone: within normal limits Gait & Station: normal Patient leans: N/A  Psychiatric Specialty Exam: Physical Exam  Psychiatric: Thought content normal. Her affect is angry and labile. Her speech is rapid and/or pressured and tangential. She is aggressive.  Cognition and memory are normal. She expresses impulsivity.    Review of Systems  Constitutional: Negative.   HENT: Negative.   Eyes: Negative.   Respiratory: Negative.   Cardiovascular: Negative.   Gastrointestinal: Negative.   Genitourinary: Negative.   Musculoskeletal: Negative.   Skin: Negative.   Neurological: Negative.   Endo/Heme/Allergies: Negative.   Psychiatric/Behavioral: The patient is nervous/anxious and has insomnia.     Blood pressure 137/57, pulse 101, temperature 98.3 F (36.8 C), temperature source Oral, resp. rate 18, SpO2 99 %.There is no weight on file to calculate BMI.  General Appearance: Casual  Eye Contact::  Fair  Speech:  Garbled and Pressured  Volume:  Normal  Mood:  Dysphoric  Affect:  Constricted  Thought Process:  Circumstantial and Disorganized  Orientation:  Full (Time, Place, and Person)  Thought Content:  Delusions  Suicidal Thoughts:  No  Homicidal Thoughts:  No  Memory:  Immediate;   Fair Recent;   Fair Remote;   Fair  Judgement:  Impaired  Insight:  Shallow  Psychomotor Activity:  Normal  Concentration:  Fair  Recall:  [employer] of Knowledge:Fair  Language: Good  Akathisia:  No  Handed:  Right  AIMS (if indicated):     Assets:  Communication Skills Desire for Improvement Physical Health  ADL's:  Intact  Cognition: WNL  Sleep:   fair   Medical Decision Making: Established Problem, Stable/Improving (1), Review or order clinical lab tests (1), Review of Medication Regimen & Side Effects (2) and Review of New Medication or Change in Dosage (2)  Treatment Plan Summary: Daily contact with patient to assess and evaluate symptoms and progress in treatment and Medication management  Plan:  No evidence of imminent risk to self or others at present.   Patient will be observed in the ED and re-evaluated for discharge tomorrow. Disposition: as above  Corena Pilgrim, MD 07/24/2015 11:50 AM

## 2015-07-25 MED ORDER — QUETIAPINE FUMARATE 100 MG PO TABS: 200.0000 mg | ORAL_TABLET | Freq: Every day | ORAL | Status: DC

## 2015-07-25 MED ORDER — BENZTROPINE MESYLATE 1 MG PO TABS: 1.0000 mg | ORAL_TABLET | Freq: Two times a day (BID) | ORAL | Status: DC

## 2015-07-25 MED ORDER — CLONAZEPAM 0.5 MG PO TABS
0.2500 mg | ORAL_TABLET | Freq: Two times a day (BID) | ORAL | Status: DC
  Administered 2015-07-25: 0.25 mg via ORAL
  Filled 2015-07-25: qty 1

## 2015-07-25 NOTE — BH Assessment (Signed)
Pt has been declined by [facility] due to medical acuity. TTS will continue to seek placement.

## 2015-07-25 NOTE — ED Notes (Signed)
[facility] called for transport. VM left.

## 2015-07-25 NOTE — BHH Counselor (Signed)
Counselor contacted [place] at [phone] and spoke with Crystal in admissions who states that the accepting physician for the patient is Dr. Lenna Sciara and patient can come anytime today  Call nurse report to [phone]  Rosalin Hawking, LCSW Therapeutic Triage Specialist [place] 07/25/2015 12:08 PM .

## 2015-07-25 NOTE — BHH Counselor (Signed)
Counselor spoke with patient at patients request. Patient states that she is nervous about driving to a facility at night. Patient states that she prefers to ride in the day time due to her distrust for officers. Patient is visibly shaking and asks "what if I don't make it?" Counselor provided supportive therapy and discussed various options to make the patient feel safe. Patient states that she can call her brother when she leaves [facility] and ask to use the phone once she gets to the new facility so that someone she trusts can be aware that she has left the facility and is on her way to another facility. states that this plan will make her feel safer. Sheriff arrived during this conversation and counselor informed patient that it was still daylight outside and patient may arrive before it gets dark.  Counselor notified nurse of patient plan to feel safe by calling her brother and patients nurse states that she would give her the phone before transport.   Rosalin Hawking, LCSW Therapeutic Triage Specialist [place] 07/25/2015 5:54 PM

## 2015-07-25 NOTE — Progress Notes (Signed)
1:48pm. CSW faxed IVC to Magistrate. Per [place] Sheriff, paperwork has been approved and law enforcement has been dispatched.  [place] Worker [place] [facility] phone: [phone]

## 2015-07-25 NOTE — Consult Note (Signed)
[facility] Face-to-Face Psychiatry Consult   Reason for Consult: "I feel like my life is threatened because of something I wrote down''. Referring Physician:  EDP Patient Identification: Paula Dickson MRN:  [id number] Principal Diagnosis: Bipolar disorder with severe mania Diagnosis:   Patient Active Problem List   Diagnosis Date Noted  . Bipolar disorder with severe mania [F31.13] 06/14/2015    Priority: High  . Acute renal failure syndrome [N17.9]   . Hypokalemia [E87.6] 06/13/2015  . Acute psychosis [F29] 06/13/2015  . Hyponatremia [E87.1] 06/13/2015  . Hypertension, uncontrolled [I10] 06/13/2015  . HTN (hypertension) [I10] 04/02/2014  . Manic depression [F31.9] 04/02/2014    Total Time spent with patient: 30 minutes  Subjective:   Paula Dickson is a 49 y.o. female patient admitted with manic behavior  HPI: Patient is a 50 year old female with a long  history of bipolar affective disorder who reports being discharged from [facility] yesterday after a 20 day stay and brought to [facility] by her family because she thinks she needs to be back in the hospital. She has become increasingly delusional, saying that her life is in danger. Patient has pressure speech, rambling from topic to topic and tangential. She is requesting to be put back on her medications. Patient denies psychosis, depressive symptoms, mood swings, suicidal/ homicidal thoughts or drug or alcohol abuse. She was released yesterday after a 20 day stay at [facility] for mania. Patient reports that her family is manipulative. She cannot indicate why she is in the emergency department. She states that she did not prompt her visit here.  Today:  [place] remains manic with EPS symptoms.  Transferring to [facility] for stabilization.   HPI Elements:   Location:  delusional. Quality:  moderate.  Past Medical History:  Past Medical History  Diagnosis Date  . History of arthritis   . History of chicken pox    . History of depression   . History of genital warts   . history of heart murmur   . History of high blood pressure   . History of thyroid disease   . History of UTI   . Bipolar affective disorder   . Hypertension     Past Surgical History  Procedure Laterality Date  . Cyst removal neck      around 11 years ago /benign  . Multiple tooth extractions    . Ablation on endometriosis     Family History:  Family History  Problem Relation Age of Onset  . Alcohol abuse Paternal Uncle   . Alcohol abuse Paternal Grandfather   . Arthritis Father   . Hyperlipidemia Father   . High blood pressure Father   . Breast cancer Maternal Aunt   . Breast cancer Paternal Aunt   . High blood pressure Sister   . Diabetes Sister   . Diabetes Mother   . Mental illness Other     runs in family  . Diabetes Brother   . Mental illness Brother    Social History:  History  Alcohol Use No     History  Drug Use No    Social History   Social History  . Marital Status: Single    Spouse Name: N/A  . Number of Children: N/A  . Years of Education: N/A   Social History Main Topics  . Smoking status: Never Smoker   . Smokeless tobacco: Never Used  . Alcohol Use: No  . Drug Use: No  . Sexual Activity: Not Currently  Other Topics Concern  . None   Social History Narrative   Additional Social History:    Pain Medications: pt denies abuse  Prescriptions: pt denies abuse  Over the Counter: pt denies abuse  History of alcohol / drug use?: No history of alcohol / drug abuse                     Allergies:  No Known Allergies  Labs:  Results for orders placed or performed during the hospital encounter of 07/24/15 (from the past 48 hour(s))  Comprehensive metabolic panel     Status: Abnormal   Collection Time: 07/24/15  4:27 AM  Result Value Ref Range   Sodium 139 135 - 145 mmol/L   Potassium 3.3 (L) 3.5 - 5.1 mmol/L   Chloride 104 101 - 111 mmol/L   CO2 26 22 - 32 mmol/L    Glucose, Bld 122 (H) 65 - 99 mg/dL   BUN 11 6 - 20 mg/dL   Creatinine, Ser 0.98 0.44 - 1.00 mg/dL   Calcium 9.7 8.9 - 10.3 mg/dL   Total Protein 7.4 6.5 - 8.1 g/dL   Albumin 4.4 3.5 - 5.0 g/dL   AST 17 15 - 41 U/L   ALT 14 14 - 54 U/L   Alkaline Phosphatase 52 38 - 126 U/L   Total Bilirubin 0.8 0.3 - 1.2 mg/dL   GFR calc non Af Amer >60 >60 mL/min   GFR calc Af Amer >60 >60 mL/min    Comment: (NOTE) The eGFR has been calculated using the CKD EPI equation. This calculation has not been validated in all clinical situations. eGFR's persistently <60 mL/min signify possible Chronic Kidney Disease.    Anion gap 9 5 - 15  CBC     Status: None   Collection Time: 07/24/15  4:27 AM  Result Value Ref Range   WBC 7.4 4.0 - 10.5 K/uL   RBC 4.86 3.87 - 5.11 MIL/uL   Hemoglobin 13.9 12.0 - 15.0 g/dL   HCT 41.1 36.0 - 46.0 %   MCV 84.6 78.0 - 100.0 fL   MCH 28.6 26.0 - 34.0 pg   MCHC 33.8 30.0 - 36.0 g/dL   RDW 13.6 11.5 - 15.5 %   Platelets 200 150 - 400 K/uL  Ethanol (ETOH)     Status: None   Collection Time: 07/24/15  4:28 AM  Result Value Ref Range   Alcohol, Ethyl (B) <5 <5 mg/dL    Comment:        LOWEST DETECTABLE LIMIT FOR SERUM ALCOHOL IS 5 mg/dL FOR MEDICAL PURPOSES ONLY   Salicylate level     Status: None   Collection Time: 07/24/15  4:28 AM  Result Value Ref Range   Salicylate Lvl <1.1 2.8 - 30.0 mg/dL  Acetaminophen level     Status: Abnormal   Collection Time: 07/24/15  4:28 AM  Result Value Ref Range   Acetaminophen (Tylenol), Serum <10 (L) 10 - 30 ug/mL    Comment:        THERAPEUTIC CONCENTRATIONS VARY SIGNIFICANTLY. A RANGE OF 10-30 ug/mL MAY BE AN EFFECTIVE CONCENTRATION FOR MANY PATIENTS. HOWEVER, SOME ARE BEST TREATED AT CONCENTRATIONS OUTSIDE THIS RANGE. ACETAMINOPHEN CONCENTRATIONS >150 ug/mL AT 4 HOURS AFTER INGESTION AND >50 ug/mL AT 12 HOURS AFTER INGESTION ARE OFTEN ASSOCIATED WITH TOXIC REACTIONS.   Urine rapid drug screen (hosp performed)  (Not at [facility])     Status: None   Collection Time: 07/24/15  5:16 AM  Result Value Ref Range   Opiates NONE DETECTED NONE DETECTED   Cocaine NONE DETECTED NONE DETECTED   Benzodiazepines NONE DETECTED NONE DETECTED   Amphetamines NONE DETECTED NONE DETECTED   Tetrahydrocannabinol NONE DETECTED NONE DETECTED   Barbiturates NONE DETECTED NONE DETECTED    Comment:        DRUG SCREEN FOR MEDICAL PURPOSES ONLY.  IF CONFIRMATION IS NEEDED FOR ANY PURPOSE, NOTIFY LAB WITHIN 5 DAYS.        LOWEST DETECTABLE LIMITS FOR URINE DRUG SCREEN Drug Class       Cutoff (ng/mL) Amphetamine      1000 Barbiturate      200 Benzodiazepine   470 Tricyclics       962 Opiates          300 Cocaine          300 THC              50     Vitals: Blood pressure 130/78, pulse 101, temperature 98.6 F (37 C), temperature source Oral, resp. rate 21, SpO2 98 %.  Risk to Self: Suicidal Ideation: No Suicidal Intent: No Is patient at risk for suicide?: No Suicidal Plan?: No Access to Means: No What has been your use of drugs/alcohol within the last 12 months?: Pt denies alcohol or drug use.  How many times?: 1 Other Self Harm Risks: No other self harm risk identified at this time.  Triggers for Past Attempts: Unpredictable Intentional Self Injurious Behavior: None Risk to Others: Homicidal Ideation: No Thoughts of Harm to Others: No Current Homicidal Intent: No Current Homicidal Plan: No Access to Homicidal Means: No Identified Victim: N/A History of harm to others?: No Assessment of Violence: None Noted Violent Behavior Description: No violent behaviors observed. Pt is calm and cooperative at this time.  Does patient have access to weapons?: No Criminal Charges Pending?: No Does patient have a court date: No Prior Inpatient Therapy: Prior Inpatient Therapy: Yes Prior Therapy Dates: unknown, 2016 Prior Therapy Facilty/Provider(s): [facility] Reason for Treatment: SI, non-compliance  with medication Prior Outpatient Therapy: Prior Outpatient Therapy: Yes Prior Therapy Dates: Current  Prior Therapy Facilty/Provider(s): [name]  Reason for Treatment: Bipolar  Does patient have an ACCT team?: No Does patient have Intensive In-House Services?  : No Does patient have [name] services? : No Does patient have [facility] services?: No  Current Facility-Administered Medications  Medication Dose Route Frequency Provider Last Rate Last Dose  . amLODipine (NORVASC) tablet 5 mg  5 mg Oral Daily Patrecia Pour, NP   5 mg at 07/25/15 1104  . benztropine (COGENTIN) tablet 1 mg  1 mg Oral BID Mykaela Arena      . clonazePAM (KLONOPIN) tablet 0.25 mg  0.25 mg Oral BID Sueann Brownley      . divalproex (DEPAKOTE) DR tablet 500 mg  500 mg Oral Q12H Raijon Lindfors   500 mg at 07/25/15 1103  . QUEtiapine (SEROQUEL) tablet 200 mg  200 mg Oral QHS Addeline Calarco      . traZODone (DESYREL) tablet 50 mg  50 mg Oral QHS PRN Verner Mccrone   50 mg at 07/24/15 2117   Current Outpatient Prescriptions  Medication Sig Dispense Refill  . benztropine (COGENTIN) 0.5 MG tablet Take 0.5 mg by mouth 2 (two) times daily.    . divalproex (DEPAKOTE ER) 250 MG 24 hr tablet 3 at h s    . QUEtiapine (SEROQUEL) 25 MG tablet Take 25  mg by mouth 3 (three) times daily.     . risperiDONE (RISPERDAL) 2 MG tablet Take 2-4 mg by mouth 2 (two) times daily. $RemoveBefo'2mg'zDOOqBYPnLR$  in am $Rem'4mg'fuoG$  in the pm    . amLODipine (NORVASC) 5 MG tablet Take 1 tablet (5 mg total) by mouth daily. 30 tablet 0  . clonazePAM (KLONOPIN) 0.5 MG tablet Take 1 tablet (0.5 mg total) by mouth 2 (two) times daily. (Patient not taking: Reported on 07/24/2015) 20 tablet 0  . divalproex (DEPAKOTE) 500 MG DR tablet Take 1 tablet (500 mg total) by mouth every 12 (twelve) hours. (Patient not taking: Reported on 07/24/2015) 60 tablet 0    Musculoskeletal: Strength & Muscle Tone: within normal limits Gait & Station: normal Patient leans: N/A  Psychiatric  Specialty Exam: Physical Exam  Psychiatric: Thought content normal. Her affect is angry and labile. Her speech is rapid and/or pressured and tangential. She is aggressive. Cognition and memory are normal. She expresses impulsivity.    Review of Systems  Constitutional: Negative.   HENT: Negative.   Eyes: Negative.   Respiratory: Negative.   Cardiovascular: Negative.   Gastrointestinal: Negative.   Genitourinary: Negative.   Musculoskeletal: Negative.   Skin: Negative.   Neurological: Negative.   Endo/Heme/Allergies: Negative.   Psychiatric/Behavioral: The patient is nervous/anxious and has insomnia.        Delusional    Blood pressure 130/78, pulse 101, temperature 98.6 F (37 C), temperature source Oral, resp. rate 21, SpO2 98 %.There is no weight on file to calculate BMI.  General Appearance: Casual  Eye Contact::  Fair  Speech:  Garbled and Pressured  Volume:  Normal  Mood:  Dysphoric  Affect:  Constricted  Thought Process:  Circumstantial and Disorganized  Orientation:  Full (Time, Place, and Person)  Thought Content:  Delusions  Suicidal Thoughts:  No  Homicidal Thoughts:  No  Memory:  Immediate;   Fair Recent;   Fair Remote;   Fair  Judgement:  Impaired  Insight:  Shallow  Psychomotor Activity:  Normal  Concentration:  Fair  Recall:  [employer] of Knowledge:Fair  Language: Good  Akathisia:  No  Handed:  Right  AIMS (if indicated):     Assets:  Communication Skills Desire for Improvement Physical Health  ADL's:  Intact  Cognition: WNL  Sleep:   fair   Medical Decision Making: Established Problem, Stable/Improving (1), Review or order clinical lab tests (1), Review of Medication Regimen & Side Effects (2) and Review of New Medication or Change in Dosage (2)  Treatment Plan Summary: Daily contact with patient to assess and evaluate symptoms and progress in treatment and Medication management:  Bipolar affective disorder, mania, severe -Medication management:   Her home medications were changed yesterday--Risperdal was not continued, Depakote 500 mg BID for bipolar d/o continued.  Seroquel 25 mg TID for mood stabilization and psychosis changed to 200 mg at bedtime.  Her Klonopin was not continued except one time dose of Klonopin 0.5 mg to assist with EPS symptoms.  Cogentin increased to 1 mg BID for EPS symptoms.  Trazodone 50 mg at bedtime for sleep PRN started.   -Klonopin 0.25 mg BID for anxiety and EPS symptoms started today -Crisis stabillization -Individual counseling -Transfer to [facility]:  Transfer to [facility] for stabilization Disposition: as above  Waylan Boga, [place] 07/25/2015 11:57 AM Patient seen face-to-face for psychiatric evaluation, chart reviewed and case discussed with the physician extender and developed treatment plan. Reviewed the information documented and  agree with the treatment plan. Corena Pilgrim, MD

## 2015-10-12 ENCOUNTER — Other Ambulatory Visit: Payer: Self-pay

## 2015-10-12 MED ORDER — AMLODIPINE BESYLATE 5 MG PO TABS: 5.0000 mg | ORAL_TABLET | Freq: Every day | ORAL | Status: DC

## 2015-10-12 NOTE — Telephone Encounter (Signed)
Pt notified refill done and pt will cb for f/u appt.

## 2015-10-12 NOTE — Telephone Encounter (Signed)
Pt left v/m requesting refill amlodipine to [facility] [address]. Pt only seen 06/24/15 with no future appt scheduled; med previously prescribed by Dr Leisa Lenz. Is it OK to refill med?

## 2015-11-29 ENCOUNTER — Inpatient Hospital Stay (HOSPITAL_COMMUNITY)
Admission: EM | Admit: 2015-11-29 | Discharge: 2015-12-09 | DRG: 071 | Disposition: A | Discharge status: Other Acute Inpatient Hospital | Payer: BLUE CROSS/BLUE SHIELD | Attending: Internal Medicine | Admitting: Internal Medicine

## 2015-11-29 ENCOUNTER — Encounter (HOSPITAL_COMMUNITY): Payer: Self-pay | Admitting: Emergency Medicine

## 2015-11-29 DIAGNOSIS — F319 Bipolar disorder, unspecified: Secondary | ICD-10-CM | POA: Diagnosis present

## 2015-11-29 DIAGNOSIS — E871 Hypo-osmolality and hyponatremia: Secondary | ICD-10-CM | POA: Diagnosis not present

## 2015-11-29 DIAGNOSIS — F54 Psychological and behavioral factors associated with disorders or diseases classified elsewhere: Secondary | ICD-10-CM | POA: Diagnosis present

## 2015-11-29 DIAGNOSIS — F22 Delusional disorders: Secondary | ICD-10-CM | POA: Diagnosis present

## 2015-11-29 DIAGNOSIS — G9340 Encephalopathy, unspecified: Secondary | ICD-10-CM

## 2015-11-29 DIAGNOSIS — E876 Hypokalemia: Secondary | ICD-10-CM

## 2015-11-29 DIAGNOSIS — G9341 Metabolic encephalopathy: Principal | ICD-10-CM | POA: Diagnosis present

## 2015-11-29 DIAGNOSIS — R402412 Glasgow coma scale score 13-15, at arrival to emergency department: Secondary | ICD-10-CM | POA: Diagnosis present

## 2015-11-29 DIAGNOSIS — F209 Schizophrenia, unspecified: Secondary | ICD-10-CM | POA: Diagnosis present

## 2015-11-29 DIAGNOSIS — M1990 Unspecified osteoarthritis, unspecified site: Secondary | ICD-10-CM | POA: Diagnosis present

## 2015-11-29 DIAGNOSIS — Z833 Family history of diabetes mellitus: Secondary | ICD-10-CM

## 2015-11-29 DIAGNOSIS — R410 Disorientation, unspecified: Secondary | ICD-10-CM | POA: Diagnosis not present

## 2015-11-29 DIAGNOSIS — R4182 Altered mental status, unspecified: Secondary | ICD-10-CM

## 2015-11-29 DIAGNOSIS — Z818 Family history of other mental and behavioral disorders: Secondary | ICD-10-CM

## 2015-11-29 DIAGNOSIS — R631 Polydipsia: Secondary | ICD-10-CM | POA: Diagnosis present

## 2015-11-29 DIAGNOSIS — Z79899 Other long term (current) drug therapy: Secondary | ICD-10-CM

## 2015-11-29 DIAGNOSIS — I10 Essential (primary) hypertension: Secondary | ICD-10-CM

## 2015-11-29 DIAGNOSIS — Z87440 Personal history of urinary (tract) infections: Secondary | ICD-10-CM

## 2015-11-29 DIAGNOSIS — F23 Brief psychotic disorder: Secondary | ICD-10-CM | POA: Diagnosis present

## 2015-11-29 DIAGNOSIS — F419 Anxiety disorder, unspecified: Secondary | ICD-10-CM | POA: Diagnosis present

## 2015-11-29 DIAGNOSIS — Z803 Family history of malignant neoplasm of breast: Secondary | ICD-10-CM

## 2015-11-29 DIAGNOSIS — E0590 Thyrotoxicosis, unspecified without thyrotoxic crisis or storm: Secondary | ICD-10-CM | POA: Diagnosis present

## 2015-11-29 LAB — COMPREHENSIVE METABOLIC PANEL
ALT: 23 U/L (ref 14–54)
AST: 26 U/L (ref 15–41)
Albumin: 4.3 g/dL (ref 3.5–5.0)
Alkaline Phosphatase: 43 U/L (ref 38–126)
Anion gap: 11 (ref 5–15)
BUN: 6 mg/dL (ref 6–20)
CO2: 24 mmol/L (ref 22–32)
Calcium: 9.3 mg/dL (ref 8.9–10.3)
Chloride: 83 mmol/L — ABNORMAL LOW (ref 101–111)
Creatinine, Ser: 0.75 mg/dL (ref 0.44–1.00)
GFR calc Af Amer: >60 mL/min (ref >60)
GFR calc non Af Amer: >60 mL/min (ref >60)
Glucose, Bld: 140 mg/dL — ABNORMAL HIGH (ref 65–99)
Potassium: 2.9 mmol/L — ABNORMAL LOW (ref 3.5–5.1)
Sodium: 118 mmol/L — CL (ref 135–145)
Total Bilirubin: 1.2 mg/dL (ref 0.3–1.2)
Total Protein: 6.9 g/dL (ref 6.5–8.1)

## 2015-11-29 LAB — SALICYLATE LEVEL: Salicylate Lvl: <4 mg/dL (ref 2.8–30.0)

## 2015-11-29 LAB — ETHANOL: Alcohol, Ethyl (B): <5 mg/dL (ref <5)

## 2015-11-29 LAB — CBC
HCT: 35.9 % — ABNORMAL LOW (ref 36.0–46.0)
Hemoglobin: 12.9 g/dL (ref 12.0–15.0)
MCH: 29.3 pg (ref 26.0–34.0)
MCHC: 35.9 g/dL (ref 30.0–36.0)
MCV: 81.4 fL (ref 78.0–100.0)
Platelets: 163 10*3/uL (ref 150–400)
RBC: 4.41 MIL/uL (ref 3.87–5.11)
RDW: 12.5 % (ref 11.5–15.5)
WBC: 8.6 10*3/uL (ref 4.0–10.5)

## 2015-11-29 LAB — ACETAMINOPHEN LEVEL: Acetaminophen (Tylenol), Serum: <10 ug/mL — ABNORMAL LOW (ref 10–30)

## 2015-11-29 MED ORDER — POTASSIUM CHLORIDE 10 MEQ/100ML IV SOLN
10.0000 meq | INTRAVENOUS | Status: AC
  Administered 2015-11-30 (×3): 10 meq via INTRAVENOUS
  Filled 2015-11-29 (×3): qty 100

## 2015-11-29 MED ORDER — LORAZEPAM 2 MG/ML IJ SOLN
1.0000 mg | Freq: Once | INTRAMUSCULAR | Status: AC
  Administered 2015-11-30: 1 mg via INTRAVENOUS
  Filled 2015-11-29: qty 1

## 2015-11-29 MED ORDER — SODIUM CHLORIDE 0.9 % IV BOLUS (SEPSIS)
1000.0000 mL | Freq: Once | INTRAVENOUS | Status: AC
  Administered 2015-11-30: 1000 mL via INTRAVENOUS

## 2015-11-29 NOTE — ED Notes (Signed)
Sodium 118 Paula Dickson from lab.

## 2015-11-29 NOTE — ED Notes (Signed)
Patient presents with sister for medical clearance. Patient has mild upper extremity tremor. Sister reports patient has had tremor since September after starting new medications. Patient unsure about visit to ED. When asked why patient presented to ED, she states "because I was disrespectful to my dad". Sister states "I want her checked out mentally."

## 2015-11-29 NOTE — ED Notes (Signed)
Pt has been seen and wanded by security.  Pt has 2 bags.

## 2015-11-29 NOTE — ED Provider Notes (Signed)
CSN: [id number]     Arrival date & time 11/29/15  2129 History   First MD Initiated Contact with Patient 11/29/15 2253     Chief Complaint  Patient presents with  . Medical Clearance     (Consider location/radiation/quality/duration/timing/severity/associated sxs/prior Treatment) HPI   Paula Dickson is a(n) 50 y.o. female who presents with confusion.  She has as pmh of Bipolar disorder. THERE IS A LEVEL 5 CAVEAT DUE TO ALTERED MENTAL STATUS. His story is gathered from nursing notes and EMR reviewed. The patient is acutely confused and cannot provide her history. Her family is not present to give any further input. According to nursing notes. The patient is here because the sister states she "wants her checked out mentally." No other further information is currently available. She has a history of previous admissions for bipolar disorder with acute psychotic features. Her sodium is noted to be at 118. Today, she has a potassium of 2.9. Patient is very tremulous. She is currently taking Depakote and Klonopin as well as Cogentin per medical records. The patient was admitted in July 2016 with acute encephalopathy, hyponatremia and hypokalemia.  Past Medical History  Diagnosis Date  . History of arthritis   . History of chicken pox   . History of depression   . History of genital warts   . history of heart murmur   . History of high blood pressure   . History of thyroid disease   . History of UTI   . Bipolar affective disorder ([place])   . Hypertension    Past Surgical History  Procedure Laterality Date  . Cyst removal neck      around 11 years ago /benign  . Multiple tooth extractions    . Ablation on endometriosis     Family History  Problem Relation Age of Onset  . Alcohol abuse Paternal Uncle   . Alcohol abuse Paternal Grandfather   . Arthritis Father   . Hyperlipidemia Father   . High blood pressure Father   . Breast cancer Maternal Aunt   . Breast cancer Paternal Aunt   .  High blood pressure Sister   . Diabetes Sister   . Diabetes Mother   . Mental illness Other     runs in family  . Diabetes Brother   . Mental illness Brother    Social History  Substance Use Topics  . Smoking status: Never Smoker   . Smokeless tobacco: Never Used  . Alcohol Use: No   OB History    No data available     Review of Systems  Ten systems reviewed and are negative for acute change, except as noted in the HPI.    Allergies  Review of patient's allergies indicates no known allergies.  Home Medications   Prior to Admission medications   Medication Sig Start Date End Date Taking? Authorizing Provider  benztropine (COGENTIN) 0.5 MG tablet Take 0.5 mg by mouth 2 (two) times daily.   Yes Historical Provider, MD  divalproex (DEPAKOTE ER) 250 MG 24 hr tablet TAKE 3 TABLETS (750 MG) BY MOUTH AT BEDTIME 07/22/15  Yes Historical Provider, MD  amLODipine (NORVASC) 5 MG tablet Take 1 tablet (5 mg total) by mouth daily. Patient not taking: Reported on 11/29/2015 10/12/15   Lucille Passy, MD  clonazePAM (KLONOPIN) 0.5 MG tablet Take 1 tablet (0.5 mg total) by mouth 2 (two) times daily. Patient not taking: Reported on 07/24/2015 06/16/15   Robbie Lis, MD  divalproex (DEPAKOTE) 500  MG DR tablet Take 1 tablet (500 mg total) by mouth every 12 (twelve) hours. Patient not taking: Reported on 07/24/2015 06/16/15   Robbie Lis, MD   BP 179/109 mmHg  Pulse 104  Temp(Src) 98.1 F (36.7 C) (Oral)  Resp 20  SpO2 98% Physical Exam  Constitutional: She appears well-developed and well-nourished. No distress.  HENT:  Head: Normocephalic and atraumatic.  Eyes: Conjunctivae are normal. No scleral icterus.  Neck: Normal range of motion.  Cardiovascular: Normal rate, regular rhythm and normal heart sounds.  Exam reveals no gallop and no friction rub.   No murmur heard. Pulmonary/Chest: Effort normal and breath sounds normal. No respiratory distress.  Abdominal: Soft. Bowel sounds are  normal. She exhibits no distension and no mass. There is no tenderness. There is no guarding.  Neurological: She is alert. She displays tremor.  Skin: Skin is warm and dry. She is not diaphoretic.  Psychiatric: Her mood appears anxious. She is agitated and slowed.  Nursing note and vitals reviewed.   ED Course  Procedures (including critical care time) Labs Review Labs Reviewed  COMPREHENSIVE METABOLIC PANEL - Abnormal; Notable for the following:    Sodium 118 (*)    Potassium 2.9 (*)    Chloride 83 (*)    Glucose, Bld 140 (*)    All other components within normal limits  ACETAMINOPHEN LEVEL - Abnormal; Notable for the following:    Acetaminophen (Tylenol), Serum <10 (*)    All other components within normal limits  CBC - Abnormal; Notable for the following:    HCT 35.9 (*)    All other components within normal limits  ETHANOL  SALICYLATE LEVEL  URINE RAPID DRUG SCREEN, HOSP PERFORMED    Imaging Review No results found. I have personally reviewed and evaluated these images and lab results as part of my medical decision-making.   EKG Interpretation None      MDM   Final diagnoses:  Encephalopathy  Hyponatremia  Hypokalemia  Essential hypertension    11:59 PM BP 179/109 mmHg  Pulse 104  Temp(Src) 98.1 F (36.7 C) (Oral)  Resp 20  SpO2 98% Patient with encephalopathy, hypertension. Hyponatremia, hypokalemia, tremors. Unsure as to the etiology of her confusion, I assume it is acute since she was brought in for mental health evaluation. I did attempt to speak with the patients sister, but was unable to contact her by phone. The patient will need a medical admission   12:10 AM BP 179/109 mmHg  Pulse 104  Temp(Src) 98.1 F (36.7 C) (Oral)  Resp 20  SpO2 98% Patient will be admitted by Dr. Sadie Haber Medications  LORazepam (ATIVAN) injection 1 mg (not administered)  sodium chloride 0.9 % bolus 1,000 mL (not administered)  potassium chloride 10 mEq in 100 mL  IVPB (not administered)     Margarita Mail, PA-C 11/30/15 0011  Daleen Bo, MD 12/01/15 1240

## 2015-11-29 NOTE — ED Notes (Signed)
Pt seems very confused and anxious.  She denies pain at this time and does not appear to be in emergent distress.  Her sister has gone home so pt is in a room directly beside the nurse's station for safety.  PA at bedside.

## 2015-11-30 DIAGNOSIS — F319 Bipolar disorder, unspecified: Secondary | ICD-10-CM | POA: Diagnosis present

## 2015-11-30 DIAGNOSIS — F54 Psychological and behavioral factors associated with disorders or diseases classified elsewhere: Secondary | ICD-10-CM | POA: Diagnosis present

## 2015-11-30 DIAGNOSIS — F209 Schizophrenia, unspecified: Secondary | ICD-10-CM | POA: Diagnosis present

## 2015-11-30 DIAGNOSIS — R410 Disorientation, unspecified: Secondary | ICD-10-CM | POA: Diagnosis not present

## 2015-11-30 DIAGNOSIS — F23 Brief psychotic disorder: Secondary | ICD-10-CM | POA: Diagnosis present

## 2015-11-30 DIAGNOSIS — Z818 Family history of other mental and behavioral disorders: Secondary | ICD-10-CM | POA: Diagnosis not present

## 2015-11-30 DIAGNOSIS — R631 Polydipsia: Secondary | ICD-10-CM | POA: Diagnosis present

## 2015-11-30 DIAGNOSIS — Z803 Family history of malignant neoplasm of breast: Secondary | ICD-10-CM | POA: Diagnosis not present

## 2015-11-30 DIAGNOSIS — E871 Hypo-osmolality and hyponatremia: Secondary | ICD-10-CM | POA: Diagnosis present

## 2015-11-30 DIAGNOSIS — F419 Anxiety disorder, unspecified: Secondary | ICD-10-CM | POA: Diagnosis present

## 2015-11-30 DIAGNOSIS — F3113 Bipolar disorder, current episode manic without psychotic features, severe: Secondary | ICD-10-CM | POA: Diagnosis not present

## 2015-11-30 DIAGNOSIS — R402412 Glasgow coma scale score 13-15, at arrival to emergency department: Secondary | ICD-10-CM | POA: Diagnosis present

## 2015-11-30 DIAGNOSIS — F29 Unspecified psychosis not due to a substance or known physiological condition: Secondary | ICD-10-CM | POA: Diagnosis not present

## 2015-11-30 DIAGNOSIS — Z79899 Other long term (current) drug therapy: Secondary | ICD-10-CM | POA: Diagnosis not present

## 2015-11-30 DIAGNOSIS — I10 Essential (primary) hypertension: Secondary | ICD-10-CM | POA: Diagnosis present

## 2015-11-30 DIAGNOSIS — G9341 Metabolic encephalopathy: Secondary | ICD-10-CM | POA: Diagnosis not present

## 2015-11-30 DIAGNOSIS — F312 Bipolar disorder, current episode manic severe with psychotic features: Secondary | ICD-10-CM | POA: Diagnosis not present

## 2015-11-30 DIAGNOSIS — F3111 Bipolar disorder, current episode manic without psychotic features, mild: Secondary | ICD-10-CM | POA: Diagnosis not present

## 2015-11-30 DIAGNOSIS — E0590 Thyrotoxicosis, unspecified without thyrotoxic crisis or storm: Secondary | ICD-10-CM | POA: Diagnosis present

## 2015-11-30 DIAGNOSIS — R4182 Altered mental status, unspecified: Secondary | ICD-10-CM

## 2015-11-30 DIAGNOSIS — Z833 Family history of diabetes mellitus: Secondary | ICD-10-CM | POA: Diagnosis not present

## 2015-11-30 DIAGNOSIS — Z87440 Personal history of urinary (tract) infections: Secondary | ICD-10-CM | POA: Diagnosis not present

## 2015-11-30 DIAGNOSIS — G9340 Encephalopathy, unspecified: Secondary | ICD-10-CM | POA: Diagnosis not present

## 2015-11-30 DIAGNOSIS — F22 Delusional disorders: Secondary | ICD-10-CM | POA: Diagnosis present

## 2015-11-30 DIAGNOSIS — M1990 Unspecified osteoarthritis, unspecified site: Secondary | ICD-10-CM | POA: Diagnosis present

## 2015-11-30 LAB — BASIC METABOLIC PANEL
Anion gap: 8 (ref 5–15)
BUN: <5 mg/dL — ABNORMAL LOW (ref 6–20)
CO2: 27 mmol/L (ref 22–32)
Calcium: 9.5 mg/dL (ref 8.9–10.3)
Chloride: 103 mmol/L (ref 101–111)
Creatinine, Ser: 0.84 mg/dL (ref 0.44–1.00)
GFR calc Af Amer: >60 mL/min (ref >60)
GFR calc non Af Amer: >60 mL/min (ref >60)
Glucose, Bld: 92 mg/dL (ref 65–99)
Potassium: 3.6 mmol/L (ref 3.5–5.1)
Sodium: 138 mmol/L (ref 135–145)

## 2015-11-30 LAB — OSMOLALITY, URINE: Osmolality, Ur: 53 mOsm/kg — ABNORMAL LOW (ref 300–900)

## 2015-11-30 LAB — URINALYSIS, ROUTINE W REFLEX MICROSCOPIC
Bilirubin Urine: NEGATIVE
Glucose, UA: NEGATIVE mg/dL
Hgb urine dipstick: NEGATIVE
Ketones, ur: NEGATIVE mg/dL
Leukocytes, UA: NEGATIVE
Nitrite: NEGATIVE
Protein, ur: NEGATIVE mg/dL
Specific Gravity, Urine: 1.004 — ABNORMAL LOW (ref 1.005–1.030)
pH: 7 (ref 5.0–8.0)

## 2015-11-30 LAB — SODIUM
Sodium: 125 mmol/L — ABNORMAL LOW (ref 135–145)
Sodium: 132 mmol/L — ABNORMAL LOW (ref 135–145)

## 2015-11-30 LAB — RAPID URINE DRUG SCREEN, HOSP PERFORMED
Amphetamines: NOT DETECTED
Barbiturates: NOT DETECTED
Benzodiazepines: NOT DETECTED
Cocaine: NOT DETECTED
Opiates: NOT DETECTED
Tetrahydrocannabinol: NOT DETECTED

## 2015-11-30 LAB — NA AND K (SODIUM & POTASSIUM), RAND UR
Potassium Urine: 4 mmol/L
Sodium, Ur: 16 mmol/L

## 2015-11-30 LAB — VALPROIC ACID LEVEL: Valproic Acid Lvl: 78 ug/mL (ref 50.0–100.0)

## 2015-11-30 MED ORDER — AMLODIPINE BESYLATE 5 MG PO TABS: 5.0000 mg | ORAL_TABLET | Freq: Every day | ORAL | Status: DC

## 2015-11-30 MED ORDER — BENZTROPINE MESYLATE 0.5 MG PO TABS
0.5000 mg | ORAL_TABLET | Freq: Two times a day (BID) | ORAL | Status: DC
  Administered 2015-11-30 – 2015-12-09 (×19): 0.5 mg via ORAL
  Filled 2015-11-30 (×20): qty 1

## 2015-11-30 MED ORDER — DIVALPROEX SODIUM 500 MG PO DR TAB
500.0000 mg | DELAYED_RELEASE_TABLET | Freq: Two times a day (BID) | ORAL | Status: DC
  Administered 2015-11-30 – 2015-12-09 (×19): 500 mg via ORAL
  Filled 2015-11-30 (×21): qty 1

## 2015-11-30 MED ORDER — ACETAMINOPHEN 325 MG PO TABS
650.0000 mg | ORAL_TABLET | Freq: Four times a day (QID) | ORAL | Status: DC | PRN
Start: 2015-11-30 — End: 2015-12-09

## 2015-11-30 MED ORDER — CLONAZEPAM 0.5 MG PO TABS
0.5000 mg | ORAL_TABLET | Freq: Two times a day (BID) | ORAL | Status: DC
  Administered 2015-11-30 – 2015-12-09 (×19): 0.5 mg via ORAL
  Filled 2015-11-30 (×19): qty 1

## 2015-11-30 MED ORDER — ENOXAPARIN SODIUM 40 MG/0.4ML ~~LOC~~ SOLN
40.0000 mg | SUBCUTANEOUS | Status: DC
  Administered 2015-12-01 – 2015-12-09 (×9): 40 mg via SUBCUTANEOUS
  Filled 2015-11-30 (×10): qty 0.4

## 2015-11-30 MED ORDER — ACETAMINOPHEN 650 MG RE SUPP: 650.0000 mg | Freq: Four times a day (QID) | RECTAL | Status: DC | PRN

## 2015-11-30 MED ORDER — QUETIAPINE FUMARATE 25 MG PO TABS
25.0000 mg | ORAL_TABLET | Freq: Three times a day (TID) | ORAL | Status: DC
  Administered 2015-11-30 – 2015-12-07 (×23): 25 mg via ORAL
  Filled 2015-11-30 (×26): qty 1

## 2015-11-30 MED ORDER — BISACODYL 5 MG PO TBEC
5.0000 mg | DELAYED_RELEASE_TABLET | Freq: Every day | ORAL | Status: DC | PRN
  Administered 2015-12-03 – 2015-12-05 (×3): 5 mg via ORAL
  Filled 2015-11-30 (×3): qty 1

## 2015-11-30 MED ORDER — PROMETHAZINE HCL 25 MG PO TABS: 12.5000 mg | ORAL_TABLET | Freq: Four times a day (QID) | ORAL | Status: DC | PRN

## 2015-11-30 NOTE — ED Notes (Signed)
Hospitalist is in the room.

## 2015-11-30 NOTE — H&P (Addendum)
[facility] History and Physical  Sherrice Kaminski [id number] DOB: 1965/12/29 DOA: 11/29/2015  Referring physician: Dr Eulis Foster PCP: Arnette Norris, MD   Chief Complaint: Confusion, tremors and unusual behavior  HPI: Paula Dickson is a 50 y.o. female with hx of bipolar/ schizophrenia/ psychosis who   Patient was admitted here in July 2016 with confusion/ agitation and hallucinations.  Required IM versed and Geodon in ED. Admitted to SDU.  Psychiatry diagnosed AMS due to lithium and Lithium was discontinued indefinitely.  Depakote and Klonopin were added and she was dc'd in better condition. Had hyponatremia that admit attributed to Li and /or HCTZ.  Na was 120-125 and urine osm was low at 111.    Patient dropped off by sister here with bad tremors since Sept after starting new medications per family.  The sister said she wants her "checked out mentally".  The patient is confused and nonverbal and is not providing any history. Na is 118 today.  I spoke with the sister on the phone who dropped her off.  She says the patient has suffered off and on with mental illness for years. She recently just got out of mental hospital [facility]  She had been doing good but over the last few days got worse. The sister says she just "won't stop drinking water".  Every 5 minutes she is opening the refrigerator and drinking water. Apparently this is what she did in July and also had done this at other times.      ROS  n/a  Where does patient live don't know, prob w family Can patient participate in ADLs? Not now  Past Medical History  Past Medical History  Diagnosis Date  . History of arthritis   . History of chicken pox   . History of depression   . History of genital warts   . history of heart murmur   . History of high blood pressure   . History of thyroid disease   . History of UTI   . Bipolar affective disorder ([place])   . Hypertension    Past Surgical History  Past Surgical  History  Procedure Laterality Date  . Cyst removal neck      around 11 years ago /benign  . Multiple tooth extractions    . Ablation on endometriosis     Family History  Family History  Problem Relation Age of Onset  . Alcohol abuse Paternal Uncle   . Alcohol abuse Paternal Grandfather   . Arthritis Father   . Hyperlipidemia Father   . High blood pressure Father   . Breast cancer Maternal Aunt   . Breast cancer Paternal Aunt   . High blood pressure Sister   . Diabetes Sister   . Diabetes Mother   . Mental illness Other     runs in family  . Diabetes Brother   . Mental illness Brother     Social History  reports that she has never smoked. She has never used smokeless tobacco. She reports that she does not drink alcohol or use illicit drugs. Allergies No Known Allergies Home medications Prior to Admission medications   Medication Sig Start Date End Date Taking? Authorizing Provider  benztropine (COGENTIN) 0.5 MG tablet Take 0.5 mg by mouth 2 (two) times daily.   Yes Historical Provider, MD  divalproex (DEPAKOTE ER) 250 MG 24 hr tablet TAKE 3 TABLETS (750 MG) BY MOUTH AT BEDTIME 07/22/15  Yes Historical Provider, MD  amLODipine (NORVASC) 5 MG tablet  Take 1 tablet (5 mg total) by mouth daily. Patient not taking: Reported on 11/29/2015 10/12/15   Lucille Passy, MD  clonazePAM (KLONOPIN) 0.5 MG tablet Take 1 tablet (0.5 mg total) by mouth 2 (two) times daily. Patient not taking: Reported on 07/24/2015 06/16/15   Robbie Lis, MD  divalproex (DEPAKOTE) 500 MG DR tablet Take 1 tablet (500 mg total) by mouth every 12 (twelve) hours. Patient not taking: Reported on 07/24/2015 06/16/15   Robbie Lis, MD   Liver Function Tests  Recent Labs Lab 11/29/15 2235  AST 26  ALT 23  ALKPHOS 43  BILITOT 1.2  PROT 6.9  ALBUMIN 4.3   No results for input(s): LIPASE, AMYLASE in the last 168 hours. CBC  Recent Labs Lab 11/29/15 2235  WBC 8.6  HGB 12.9  HCT 35.9*  MCV 81.4  PLT [id number]    Basic Metabolic Panel  Recent Labs Lab 11/29/15 2235  NA 118*  K 2.9*  CL 83*  CO2 24  GLUCOSE 140*  BUN 6  CREATININE 0.75  CALCIUM 9.3     Filed Vitals:   11/29/15 2140  BP: 179/109  Pulse: 104  Temp: 98.1 F (36.7 C)  TempSrc: Oral  Resp: 20  SpO2: 98%   Exam: HR 104  R 20  T 98.1  BP 179/109   96% RA Pt awake and alert, looks scared, walking around the room. Tremors of arm.   No rash, cyanosis or gangrene Sclera anicteric, throat clear No jvd Chest clear bilat RRR no MRG Abd soft ntnd no mass or ascites  MS no joint changes Ext no LE or UE edema, no wounds or ulcers Neuro some tremors L arm, o/w nonfocal exam Nonverbal. No gait issues.   Home medications >  Cogentin 0.5 mg bid Depakote ER 500mg  every 12 hours (or 750 mg qhs) Norvasc 5 daily Klonopin 0.5 mg bid   Na 119  K 2.9  CL 83 BUN 6  Creat 0.75   Alb 4.3  Ca 9.3  LFT's ok  Glu 140 WBC 8k  Hb 12.9  plt 163  Valproic acid 78 (50- [id number]) Salicylate 123456, tylenol <10  Assessment: 1 Hyponatremia - in patient w chronic psych illness with recent onset of severe polydipsia according to her family.  This goes along with July's admission when Uosm was low at 111.  According to the sister when she deteriorates mentally it is usually associated with extreme water drinking, which she has been doing the last few days.  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  Not sure she would cooperate with central line placement for hypertonic saline at this moment, she is confused and a bit agitated, would barely allow an exam.  Will do I/O cath for urine lytes/ osm and restrict H2O/ liquids, get Na level every 4 hours for now.  If not improving consider renal consult.   2 Schizophrenia/ bipolar - w AMS   3 HTN - cont norvasc   Plan - no access to water, restraints prn.  Urine Na, Uosm.  Serum Na q 4hrs. NPO.  If not getting better will consider PICC and 3% saline. Will follow.    DVT  Prophylaxis lovenox Code Status: full  Family Communication: discussed w sister on the phone  Disposition Plan: home when stable    [facility] Pager [phone]  Cell [phone]  If 7PM-7AM, please contact night-coverage [email or web address] Password [email or web address] 11/30/2015, 1:36 AM

## 2015-11-30 NOTE — ED Provider Notes (Signed)
1:50 PM patient is alert Glasgow Coma Score 15 ambulatory without difficulty. 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 Seen by internal medicine yesterday. Serum sodium has been repleted Results for orders placed or performed during the hospital encounter of 11/29/15  Comprehensive metabolic panel  Result Value Ref Range   Sodium 118 (LL) 135 - 145 mmol/L   Potassium 2.9 (L) 3.5 - 5.1 mmol/L   Chloride 83 (L) 101 - 111 mmol/L   CO2 24 22 - 32 mmol/L   Glucose, Bld 140 (H) 65 - 99 mg/dL   BUN 6 6 - 20 mg/dL   Creatinine, Ser 0.75 0.44 - 1.00 mg/dL   Calcium 9.3 8.9 - 10.3 mg/dL   Total Protein 6.9 6.5 - 8.1 g/dL   Albumin 4.3 3.5 - 5.0 g/dL   AST 26 15 - 41 U/L   ALT 23 14 - 54 U/L   Alkaline Phosphatase 43 38 - 126 U/L   Total Bilirubin 1.2 0.3 - 1.2 mg/dL   GFR calc non Af Amer >60 >60 mL/min   GFR calc Af Amer >60 >60 mL/min   Anion gap 11 5 - 15  Ethanol (ETOH)  Result Value Ref Range   Alcohol, Ethyl (B) <5 <5 mg/dL  Salicylate level  Result Value Ref Range   Salicylate Lvl 123456 2.8 - 30.0 mg/dL  Acetaminophen level  Result Value Ref Range   Acetaminophen (Tylenol), Serum <10 (L) 10 - 30 ug/mL  CBC  Result Value Ref Range   WBC 8.6 4.0 - 10.5 K/uL   RBC 4.41 3.87 - 5.11 MIL/uL   Hemoglobin 12.9 12.0 - 15.0 g/dL   HCT 35.9 (L) 36.0 - 46.0 %   MCV 81.4 78.0 - 100.0 fL   MCH 29.3 26.0 - 34.0 pg   MCHC 35.9 30.0 - 36.0 g/dL   RDW 12.5 11.5 - 15.5 %   Platelets 163 150 - 400 K/uL  Urine rapid drug screen (hosp performed) (Not at [facility])  Result Value Ref Range   Opiates NONE DETECTED NONE DETECTED   Cocaine NONE DETECTED NONE DETECTED   Benzodiazepines NONE DETECTED NONE DETECTED   Amphetamines NONE DETECTED NONE DETECTED   Tetrahydrocannabinol NONE DETECTED NONE DETECTED   Barbiturates NONE DETECTED NONE DETECTED  Valproic acid level  Result Value Ref Range   Valproic Acid Lvl 78 50.0 - 100.0 ug/mL  Sodium  Result Value Ref Range   Sodium 125 (L) 135 - 145 mmol/L  Sodium  Result Value Ref Range   Sodium 132 (L) 135 - 145 mmol/L  Osmolality, urine  Result Value Ref Range   Osmolality, Ur 53 (L) 300 - 900 mOsm/kg  Urinalysis, Routine w reflex microscopic (not at [facility])  Result Value Ref Range   Color, Urine YELLOW YELLOW   APPearance CLEAR CLEAR   Specific Gravity, Urine 1.004 (L) 1.005 - 1.030   pH 7.0 5.0 - 8.0   Glucose, UA NEGATIVE NEGATIVE mg/dL   Hgb urine dipstick NEGATIVE NEGATIVE   Bilirubin Urine NEGATIVE NEGATIVE   Ketones, ur NEGATIVE NEGATIVE mg/dL   Protein, ur NEGATIVE NEGATIVE mg/dL   Nitrite NEGATIVE NEGATIVE   Leukocytes, UA NEGATIVE NEGATIVE  Na and K (sodium & potassium), rand urine  Result Value Ref Range   Sodium, Ur 16 mmol/L   Potassium Urine Timed 4 mmol/L  Basic metabolic panel  Result Value Ref Range   Sodium 138 135 - 145 mmol/L   Potassium 3.6 3.5 - 5.1 mmol/L   Chloride 103  101 - 111 mmol/L   CO2 27 22 - 32 mmol/L   Glucose, Bld 92 65 - 99 mg/dL   BUN <5 (L) 6 - 20 mg/dL   Creatinine, Ser 0.84 0.44 - 1.00 mg/dL   Calcium 9.5 8.9 - 10.3 mg/dL   GFR calc non Af Amer >60 >60 mL/min   GFR calc Af Amer >60 >60 mL/min   Anion gap 8 5 - 15   No results found. TTS consulted for psychiatric admission  Orlie Dakin, MD 11/30/15 [phone]

## 2015-11-30 NOTE — ED Notes (Signed)
Per Adriane, RN attempt made to obtain urine sample from pt, but pt urinated all over the floor. Will attempt to recollect.

## 2015-11-30 NOTE — ED Notes (Signed)
Pt had incontinent episode, urine.

## 2015-11-30 NOTE — ED Notes (Signed)
Pt continues to rest in bed, awake and alert, in no acute distress.  RN is present at the bedside to continue to monitor the patient's behavior.

## 2015-11-30 NOTE — ED Notes (Signed)
[facility] paged

## 2015-11-30 NOTE — Progress Notes (Signed)
Subjective: Patient admitted this morning, see detailed H&P by Dr. Jonnie Finner. 50 y.o. female with hx of bipolar/ schizophrenia/ psychosis who   Patient was admitted here in July 2016 with confusion/ agitation and hallucinations. Required IM versed and Geodon in ED. Admitted to SDU. Psychiatry diagnosed AMS due to lithium and Lithium was discontinued indefinitely. Depakote and Klonopin were added and she was dc'd in better condition. Had hyponatremia that admit attributed to Li and /or HCTZ. Na was 120-125 and urine osm was low at 111. This morning patient continues to be confused. Sodium has now improved to 138. Filed Vitals:   11/30/15 0927 11/30/15 1134  BP: 129/87 153/97  Pulse: 92 97  Temp: 98.1 F (36.7 C)   Resp: 16 16    Chest: Clear Bilaterally Heart : S1S2 RRR Abdomen: Soft, nontender Ext : No edema Neuro: Alert, oriented x 3  A/P Hyponatremia Psychosis  Hyponatremia has resolved, will get psych consultation. Monitor the patient on telemetry.   [facility] [phone]

## 2015-11-30 NOTE — ED Notes (Signed)
THE PHYSICIAN ASKED THAT THE PT HAVE A LIMITED AMOUNT OF WATER INTAKE. SHE HAS POLYDIPSIA. HE ALSO WANTS THE PT TO BE WATCHED, OR THE SINKS IN THE ROOM TO BE SHUT OFF SO SHE CAN NOT DRINK WATER FROM THE FAUCETS.

## 2015-11-30 NOTE — Plan of Care (Signed)
Problem: Education: Goal: Knowledge of [facility] Education information/materials will improve Outcome: Progressing Discussed bed alarm use, calling for help

## 2015-11-30 NOTE — ED Notes (Signed)
Message sent regarding admission to Dr. Darrick Meigs via Amnion.

## 2015-11-30 NOTE — BH Assessment (Signed)
Assessment Note  Paula Dickson is an 50 y.o. female, single, black who presents to [facility] with family member (sister) due to symptoms of confusion, not eating, and drinking water excessively.. Pt has a history of bipolar disorder with associated manic episodes. She currently presents with normal speech, somewhat disorganized thought process, and labile mood. She is able to answer most question appropriately. She does deny current suicidal ideation or homicidal ideation. She has a history of 1 suicide attempt by OD (teenager). The previous suicide attempt was triggered by a argument with her oldest brother. She denies experiencing auditory or visual hallucinations. Pt denies alcohol or other substances.  Pt is dressed in hospital scrubs, alert, and not oriented to person, location, time or situation. Her speech is pressured. Motor behavior is normal. Patient is extremely tremulous. Eye contact is good. Pt's mood is labile. Affect is labile. Thought process is disorganized. Pt's insight and judgment are fair. Patient was calm and cooperative.  Patient has received inpatient treatment in the past at [facility], [facility], and last admission was [facility]. Patient was hospitalized at [facility] 06/2015. Patient does not know what her mental health diagnosis is or what type of psychiatric medications she is taking. Sts that her father gives her the medications and she takes them.   Reginold Agent, NP recommended overnight observation. Patient was however transferred to the medical floor for further medical clearance and possible psych consult.     Diagnosis: Bipolar Affective Disorder  Past Medical History:  Past Medical History  Diagnosis Date  . History of arthritis   . History of chicken pox   . History of depression   . History of genital warts   . history of heart murmur   . History of high blood pressure   . History of thyroid disease   . History of UTI   . Bipolar affective disorder ([place])   . Hypertension      Past Surgical History  Procedure Laterality Date  . Cyst removal neck      around 11 years ago /benign  . Multiple tooth extractions    . Ablation on endometriosis      Family History:  Family History  Problem Relation Age of Onset  . Alcohol abuse Paternal Uncle   . Alcohol abuse Paternal Grandfather   . Arthritis Father   . Hyperlipidemia Father   . High blood pressure Father   . Breast cancer Maternal Aunt   . Breast cancer Paternal Aunt   . High blood pressure Sister   . Diabetes Sister   . Diabetes Mother   . Mental illness Other     runs in family  . Diabetes Brother   . Mental illness Brother     Social History:  reports that she has never smoked. She has never used smokeless tobacco. She reports that she does not drink alcohol or use illicit drugs.  Additional Social History:  Alcohol / Drug Use Pain Medications: SEE MAR Prescriptions: SEE MAR Over the Counter: SEE MAR History of alcohol / drug use?: No history of alcohol / drug abuse  CIWA: CIWA-Ar BP: (!) 153/92 mmHg Pulse Rate: 89 COWS:    Allergies: No Known Allergies  Home Medications:  Medications Prior to Admission  Medication Sig Dispense Refill  . benztropine (COGENTIN) 0.5 MG tablet Take 0.5 mg by mouth 2 (two) times daily.    . divalproex (DEPAKOTE ER) 250 MG 24 hr tablet TAKE 3 TABLETS (750 MG) BY MOUTH AT BEDTIME    .  amLODipine (NORVASC) 5 MG tablet Take 1 tablet (5 mg total) by mouth daily. (Patient not taking: Reported on 11/29/2015) 30 tablet 0  . clonazePAM (KLONOPIN) 0.5 MG tablet Take 1 tablet (0.5 mg total) by mouth 2 (two) times daily. (Patient not taking: Reported on 07/24/2015) 20 tablet 0  . divalproex (DEPAKOTE) 500 MG DR tablet Take 1 tablet (500 mg total) by mouth every 12 (twelve) hours. (Patient not taking: Reported on 07/24/2015) 60 tablet 0    OB/GYN Status:  No LMP recorded. Patient is not currently having periods (Reason: Perimenopausal).  General Assessment  Data Location of Assessment: [facility] ED TTS Assessment: In system Is this a Tele or Face-to-Face Assessment?: Face-to-Face Is this an Initial Assessment or a Re-assessment for this encounter?: Initial Assessment Marital status: Single Is patient pregnant?: No Pregnancy Status: No Living Arrangements: Parent Can pt return to current living arrangement?: Yes Admission Status: Voluntary Is patient capable of signing voluntary admission?: No Referral Source: Self/Family/Friend     Crisis Care Plan Living Arrangements: Parent Name of Psychiatrist:  [name] ) Name of Therapist:  [name] )  Education Status Is patient currently in school?: No Current Grade:  (n/a) Highest grade of school patient has completed:  (some college) Name of school:  (n/a) Contact person:  (n/a)  Risk to self with the past 6 months Suicidal Ideation: No Has patient been a risk to self within the past 6 months prior to admission? : No Suicidal Intent: No Has patient had any suicidal intent within the past 6 months prior to admission? : No Is patient at risk for suicide?: No Suicidal Plan?: No Has patient had any suicidal plan within the past 6 months prior to admission? : No Access to Means: No What has been your use of drugs/alcohol within the last 12 months?:  (patient denies ) Previous Attempts/Gestures: Yes How many times?:  (1x- "When I was a teenager I overdosed"') Other Self Harm Risks:  (none reported) Triggers for Past Attempts: Other (Comment) ("My brother called me a watch") Intentional Self Injurious Behavior: None Family Suicide History: Yes Persecutory voices/beliefs?: No Depression: No Depression Symptoms: Feeling angry/irritable, Loss of interest in usual pleasures, Feeling worthless/self pity, Guilt, Fatigue, Isolating, Tearfulness, Insomnia, Despondent Substance abuse history and/or treatment for substance abuse?: No Suicide prevention information given to non-admitted patients: Not  applicable  Risk to Others within the past 6 months Homicidal Ideation: No Does patient have any lifetime risk of violence toward others beyond the six months prior to admission? : No Thoughts of Harm to Others: No Current Homicidal Intent: No Current Homicidal Plan: No Access to Homicidal Means: No Identified Victim:  (n/a) History of harm to others?: No Assessment of Violence: None Noted Violent Behavior Description:  (patient is calm and cooperative ) Does patient have access to weapons?: No Criminal Charges Pending?: No Does patient have a court date: No Is patient on probation?: Yes  Psychosis Hallucinations: None noted Delusions: None noted  Mental Status Report Appearance/Hygiene: Disheveled, In scrubs Eye Contact: Poor Motor Activity: Hyperactivity Speech: Logical/coherent Level of Consciousness: Alert Mood: Anxious Affect: Appropriate to circumstance Anxiety Level: None Thought Processes: Relevant, Coherent Judgement: Impaired Orientation: Person, Place, Time, Situation Obsessive Compulsive Thoughts/Behaviors: Minimal  Cognitive Functioning Concentration: Decreased Memory: Recent Intact, Remote Intact IQ: Average Insight: Fair Impulse Control: Fair Appetite: Fair Weight Loss:  (none reported) Weight Gain:  (none reported)  ADLScreening [facility] Assessment Services) Patient's cognitive ability adequate to safely complete daily activities?: Yes Patient able to express need  for assistance with ADLs?: Yes Independently performs ADLs?: Yes (appropriate for developmental age)  Prior Inpatient Therapy Prior Inpatient Therapy: No Prior Therapy Dates:  (n/a) Prior Therapy Facilty/Provider(s):  (n/a) Reason for Treatment:  (n/a)  Prior Outpatient Therapy Prior Outpatient Therapy: No Prior Therapy Dates:  (n/a) Prior Therapy Facilty/Provider(s):  (n/a) Reason for Treatment:  (n/a) Does patient have an ACCT team?: No Does patient have Intensive In-House  Services?  : No Does patient have [name] services? : No Does patient have P4CC services?: No  ADL Screening (condition at time of admission) Patient's cognitive ability adequate to safely complete daily activities?: Yes Is the patient deaf or have difficulty hearing?: No Does the patient have difficulty seeing, even when wearing glasses/contacts?: No Does the patient have difficulty concentrating, remembering, or making decisions?: Yes Patient able to express need for assistance with ADLs?: Yes Does the patient have difficulty dressing or bathing?: No Independently performs ADLs?: Yes (appropriate for developmental age) Does the patient have difficulty walking or climbing stairs?: No Weakness of Legs: None Weakness of Arms/Hands: None  Home Assistive Devices/Equipment Home Assistive Devices/Equipment: None  Therapy Consults (therapy consults require a physician order) PT Evaluation Needed: No OT Evalulation Needed: No SLP Evaluation Needed: No Abuse/Neglect Assessment (Assessment to be complete while patient is alone) Physical Abuse: Denies Verbal Abuse: Denies Sexual Abuse: Denies Exploitation of patient/patient's resources: Denies Self-Neglect: Denies Values / Beliefs Cultural Requests During Hospitalization: None Spiritual Requests During Hospitalization: None Consults Spiritual Care Consult Needed: No Social Work Consult Needed: No Regulatory affairs officer (For Healthcare) Does patient have an advance directive?: No Would patient like information on creating an advanced directive?: No - patient declined information Nutrition Screen- [facility] Adult/[facility] Patient's home diet: Regular Has the patient recently lost weight without trying?: No Has the patient been eating poorly because of a decreased appetite?: Yes Malnutrition Screening Tool Score: 1  Additional Information 1:1 In Past 12 Months?: No CIRT Risk: No Elopement Risk: No Does patient have medical clearance?: Yes      Disposition:  Disposition Initial Assessment Completed for this Encounter: Yes Disposition of Patient: Other dispositions Reginold Agent, NP recommended overnight observation)  On Site Evaluation by:   Reviewed with Physician:    Evangeline Gula 11/30/2015 3:49 PM

## 2015-11-30 NOTE — Consult Note (Signed)
[facility] Consult   Reason for Consult:  Bipolar psychosis Referring Physician:  Dr. Darrick Meigs Patient Identification: Paula Dickson MRN:  [id number] Principal Diagnosis: Hyponatremia Diagnosis:   Patient Active Problem List   Diagnosis Date Noted  . 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 Hyponatremia [E87.1] 11/30/2015  . Altered mental status [R41.82] 11/30/2015  . Tremor [R25.1] 11/30/2015  . Schizophrenia ([place]) [F20.9] 11/30/2015  . Acute renal failure syndrome ([place]) [N17.9]   . Bipolar disorder with severe mania ([place]) [F31.13] 06/14/2015  . Hypokalemia [E87.6] 06/13/2015  . Acute psychosis [F29] 06/13/2015  . Hypertension, uncontrolled [I10] 06/13/2015  . HTN (hypertension) [I10] 04/02/2014  . 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 female patient admitted with bad tremors and confusion.  HPI:  Paula Dickson is a 50 y.o. female seen for face-to-face psychiatric consultation and evaluation of increased confusion, tremors, hyponatremia secondary to psychogenic polydipsia. Patient has a history of bipolar disorder and lithium therapy until July 2015. Patient was recently admitted to [facility] in August 2016 for acute bipolar psychosis and agitation. Patient has been following up with the [facility] for outpatient medication management and as per her sister's one of the medication toes and was discontinued. Patient presented to the [facility] long emergency department in July 2016 with increased confusion/agitation and hallucinations and required Geodon in the emergency department. Psychiatry diagnosed AMS due to lithium and Lithium was discontinued indefinitely.Patient presented with hyponatremia at 111 secondary to polydipsia and hand tremors. Patient reported that she started drinking more water secondary to feeling nervousness and anxiety. Patient  reported her clonazepam will help her to control some of the anxiety. Patient hyponatremia has been corrected with the current fluid therapy and restriction of water.  Spoke with the sister on the phone who dropped her off. She says the patient has suffered off and on with mental illness for years. She recently just got out of mental hospital in [facility] She had been doing good but over the last few days got worse. The sister says she just "won't stop drinking water". Every 5 minutes she is opening the refrigerator and drinking water, as per patient father she drank 72 times yesterday.   Past Psychiatric History: hx of bipolar/ schizophrenia/ psychosis and recent admission to [facility]. Patient reportedly receiving outpatient medication management from [facility] behavioral.   Risk to Self: Is patient at risk for suicide?: No, but patient needs Medical Clearance Risk to Others:   Prior Inpatient Therapy:   Prior Outpatient Therapy:    Past Medical History:  Past Medical History  Diagnosis Date  . History of arthritis   . History of chicken pox   . History of depression   . History of genital warts   . history of heart murmur   . History of high blood pressure   . History of thyroid disease   . History of UTI   . Bipolar affective disorder ([place])   . Hypertension     Past Surgical History  Procedure Laterality Date  . Cyst removal neck      around 11 years ago /benign  . Multiple tooth extractions    . Ablation on endometriosis     Family History:  Family History  Problem Relation Age of Onset  . Alcohol abuse Paternal Uncle   . Alcohol abuse Paternal Grandfather   . Arthritis Father   .  Hyperlipidemia Father   . High blood pressure Father   . Breast cancer Maternal Aunt   . Breast cancer Paternal Aunt   . High blood pressure Sister   . Diabetes Sister   . Diabetes Mother   . Mental illness Other     runs in family  . Diabetes Brother   . Mental illness Brother     Family Psychiatric  History: Brother with depression and older sister had brief depression. Social History:  History  Alcohol Use No     History  Drug Use No    Social History   Social History  . Marital Status: Single    Spouse Name: N/A  . Number of Children: N/A  . Years of Education: N/A   Social History Main Topics  . Smoking status: Never Smoker   . Smokeless tobacco: Never Used  . Alcohol Use: No  . Drug Use: No  . Sexual Activity: Not Currently   Other Topics Concern  . None   Social History Narrative   Additional Social History:                          Allergies:  No Known Allergies  Labs:  Results for orders placed or performed during the hospital encounter of 11/29/15 (from the past 48 hour(s))  Comprehensive metabolic panel     Status: Abnormal   Collection Time: 11/29/15 10:35 PM  Result Value Ref Range   Sodium 118 (LL) 135 - 145 mmol/L    Comment: CRITICAL RESULT CALLED TO, READ BACK BY AND VERIFIED WITH: K.DREWERY,RN AT 2307 ON 11/29/15 BY W.SHEA    Potassium 2.9 (L) 3.5 - 5.1 mmol/L   Chloride 83 (L) 101 - 111 mmol/L   CO2 24 22 - 32 mmol/L   Glucose, Bld 140 (H) 65 - 99 mg/dL   BUN 6 6 - 20 mg/dL   Creatinine, Ser 0.75 0.44 - 1.00 mg/dL   Calcium 9.3 8.9 - 10.3 mg/dL   Total Protein 6.9 6.5 - 8.1 g/dL   Albumin 4.3 3.5 - 5.0 g/dL   AST 26 15 - 41 U/L   ALT 23 14 - 54 U/L   Alkaline Phosphatase 43 38 - 126 U/L   Total Bilirubin 1.2 0.3 - 1.2 mg/dL   GFR calc non Af Amer >60 >60 mL/min   GFR calc Af Amer >60 >60 mL/min    Comment: (NOTE) The eGFR has been calculated using the CKD EPI equation. This calculation has not been validated in all clinical situations. eGFR's persistently <60 mL/min signify possible Chronic Kidney Disease.    Anion gap 11 5 - 15  CBC     Status: Abnormal   Collection Time: 11/29/15 10:35 PM  Result Value Ref Range   WBC 8.6 4.0 - 10.5 K/uL   RBC 4.41 3.87 - 5.11 MIL/uL   Hemoglobin 12.9 12.0 -  15.0 g/dL   HCT 35.9 (L) 36.0 - 46.0 %   MCV 81.4 78.0 - 100.0 fL   MCH 29.3 26.0 - 34.0 pg   MCHC 35.9 30.0 - 36.0 g/dL   RDW 12.5 11.5 - 15.5 %   Platelets 163 150 - 400 K/uL  Ethanol (ETOH)     Status: None   Collection Time: 11/29/15 10:36 PM  Result Value Ref Range   Alcohol, Ethyl (B) <5 <5 mg/dL    Comment:        LOWEST DETECTABLE LIMIT FOR SERUM ALCOHOL IS  5 mg/dL FOR MEDICAL PURPOSES ONLY   Salicylate level     Status: None   Collection Time: 11/29/15 10:36 PM  Result Value Ref Range   Salicylate Lvl <2.1 2.8 - 30.0 mg/dL  Acetaminophen level     Status: Abnormal   Collection Time: 11/29/15 10:36 PM  Result Value Ref Range   Acetaminophen (Tylenol), Serum <10 (L) 10 - 30 ug/mL    Comment:        THERAPEUTIC CONCENTRATIONS VARY SIGNIFICANTLY. A RANGE OF 10-30 ug/mL MAY BE AN EFFECTIVE CONCENTRATION FOR MANY PATIENTS. HOWEVER, SOME ARE BEST TREATED AT CONCENTRATIONS OUTSIDE THIS RANGE. ACETAMINOPHEN CONCENTRATIONS >150 ug/mL AT 4 HOURS AFTER INGESTION AND >50 ug/mL AT 12 HOURS AFTER INGESTION ARE OFTEN ASSOCIATED WITH TOXIC REACTIONS.   Valproic acid level     Status: None   Collection Time: 11/29/15 10:40 PM  Result Value Ref Range   Valproic Acid Lvl 78 50.0 - 100.0 ug/mL  Sodium     Status: Abnormal   Collection Time: 11/30/15  2:23 AM  Result Value Ref Range   Sodium 125 (L) 135 - 145 mmol/L    Comment: DELTA CHECK NOTED  Urine rapid drug screen (hosp performed) (Not at [facility])     Status: None   Collection Time: 11/30/15  4:20 AM  Result Value Ref Range   Opiates NONE DETECTED NONE DETECTED   Cocaine NONE DETECTED NONE DETECTED   Benzodiazepines NONE DETECTED NONE DETECTED   Amphetamines NONE DETECTED NONE DETECTED   Tetrahydrocannabinol NONE DETECTED NONE DETECTED   Barbiturates NONE DETECTED NONE DETECTED    Comment:        DRUG SCREEN FOR MEDICAL PURPOSES ONLY.  IF CONFIRMATION IS NEEDED FOR ANY PURPOSE, NOTIFY LAB WITHIN 5 DAYS.         LOWEST DETECTABLE LIMITS FOR URINE DRUG SCREEN Drug Class       Cutoff (ng/mL) Amphetamine      1000 Barbiturate      200 Benzodiazepine   194 Tricyclics       174 Opiates          300 Cocaine          300 THC              50   Osmolality, urine     Status: Abnormal   Collection Time: 11/30/15  4:20 AM  Result Value Ref Range   Osmolality, Ur 53 (L) 300 - 900 mOsm/kg    Comment: Performed at [facility]  Urinalysis, Routine w reflex microscopic (not at [facility])     Status: Abnormal   Collection Time: 11/30/15  4:20 AM  Result Value Ref Range   Color, Urine YELLOW YELLOW   APPearance CLEAR CLEAR   Specific Gravity, Urine 1.004 (L) 1.005 - 1.030   pH 7.0 5.0 - 8.0   Glucose, UA NEGATIVE NEGATIVE mg/dL   Hgb urine dipstick NEGATIVE NEGATIVE   Bilirubin Urine NEGATIVE NEGATIVE   Ketones, ur NEGATIVE NEGATIVE mg/dL   Protein, ur NEGATIVE NEGATIVE mg/dL   Nitrite NEGATIVE NEGATIVE   Leukocytes, UA NEGATIVE NEGATIVE    Comment: MICROSCOPIC NOT DONE ON URINES WITH NEGATIVE PROTEIN, BLOOD, LEUKOCYTES, NITRITE, OR GLUCOSE <1000 mg/dL.  Na and K (sodium & potassium), rand urine     Status: None   Collection Time: 11/30/15  4:20 AM  Result Value Ref Range   Sodium, Ur 16 mmol/L   Potassium Urine Timed 4 mmol/L    Comment: Performed at [facility]  Sodium     Status: Abnormal   Collection Time: 11/30/15  5:35 AM  Result Value Ref Range   Sodium 132 (L) 135 - 145 mmol/L    Comment: DELTA CHECK NOTED REPEATED TO VERIFY     No current facility-administered medications for this encounter.   Current Outpatient Prescriptions  Medication Sig Dispense Refill  . benztropine (COGENTIN) 0.5 MG tablet Take 0.5 mg by mouth 2 (two) times daily.    . divalproex (DEPAKOTE ER) 250 MG 24 hr tablet TAKE 3 TABLETS (750 MG) BY MOUTH AT BEDTIME    . amLODipine (NORVASC) 5 MG tablet Take 1 tablet (5 mg total) by mouth daily. (Patient not taking: Reported on 11/29/2015) 30 tablet 0  .  clonazePAM (KLONOPIN) 0.5 MG tablet Take 1 tablet (0.5 mg total) by mouth 2 (two) times daily. (Patient not taking: Reported on 07/24/2015) 20 tablet 0  . divalproex (DEPAKOTE) 500 MG DR tablet Take 1 tablet (500 mg total) by mouth every 12 (twelve) hours. (Patient not taking: Reported on 07/24/2015) 60 tablet 0    Musculoskeletal: Strength & Muscle Tone: decreased Gait & Station: unable to stand Patient leans: N/A  Psychiatric Specialty Exam: ROS increased nervousness, anxiety disturbed sleep. Denied nausea, vomiting, abdominal pain, shortness of breath and chest pain No Fever-chills, No Headache, No changes with Vision or hearing, reports vertigo No problems swallowing food or Liquids, No Chest pain, Cough or Shortness of Breath, No Abdominal pain, No Nausea or Vommitting, Bowel movements are regular, No Blood in stool or Urine, No dysuria, No new skin rashes or bruises, No new joints pains-aches,  No new weakness, tingling, numbness in any extremity, No recent weight gain or loss, No polyuria, polydypsia or polyphagia,   A full 10 point Review of Systems was done, except as stated above, all other Review of Systems were negative.  Blood pressure 129/87, pulse 92, temperature 98.1 F (36.7 C), temperature source Oral, resp. rate 16, SpO2 98 %.There is no weight on file to calculate BMI.  General Appearance: Bizarre and Guarded  Eye Contact::  Good  Speech:  Blocked, Pressured and Slurred  Volume:  Normal  Mood:  Angry, Anxious and Depressed  Affect:  Non-Congruent and Labile  Thought Process:  Disorganized, Irrelevant and Tangential  Orientation:  Full (Time, Place, and Person)  Thought Content:  Delusions and Rumination  Suicidal Thoughts:  No  Homicidal Thoughts:  No  Memory:  Immediate;   Fair Recent;   Fair  Judgement:  Fair  Insight:  Fair  Psychomotor Activity:  Restlessness  Concentration:  Fair  Recall:  [place] of Knowledge:Good  Language: Good  Akathisia:   Negative  Handed:  Right  AIMS (if indicated):     Assets:  Communication Skills Desire for Improvement Financial Resources/Insurance Housing Leisure Time Physical Health Resilience Social Support Transportation  ADL's:  Impaired  Cognition: WNL and Impaired,  Mild  Sleep:      Treatment Plan Summary: Daily contact with patient to assess and evaluate symptoms and progress in treatment and Medication management  Continue Depakote ER thousand milligrams at bedtime, review of valproic acid level indicates 70 which is within therapeutic range for mood stabilization Continue clonazepam 0.5 mg twice daily for anxiety We will start Seroquel 25 mg 3 times daily for controlling anxiety and mood swings, will check EKG monitor for the QTC prolongation  Disposition: Recommend psychiatric Inpatient admission when medically cleared. Supportive therapy provided about ongoing stressors.  Paula Dickson,JANARDHAHA R. 11/30/2015  10:40 AM

## 2015-11-30 NOTE — Progress Notes (Signed)
Pt states "I can stop drinking water, I only drink water when I'm nervous"

## 2015-11-30 NOTE — ED Notes (Signed)
Patient is resting comfortably. 

## 2015-12-01 DIAGNOSIS — G9340 Encephalopathy, unspecified: Secondary | ICD-10-CM | POA: Diagnosis present

## 2015-12-01 DIAGNOSIS — F3113 Bipolar disorder, current episode manic without psychotic features, severe: Secondary | ICD-10-CM

## 2015-12-01 LAB — BASIC METABOLIC PANEL
Anion gap: 8 (ref 5–15)
BUN: 6 mg/dL (ref 6–20)
CO2: 28 mmol/L (ref 22–32)
Calcium: 9.5 mg/dL (ref 8.9–10.3)
Chloride: 107 mmol/L (ref 101–111)
Creatinine, Ser: 1.02 mg/dL — ABNORMAL HIGH (ref 0.44–1.00)
GFR calc Af Amer: >60 mL/min (ref >60)
GFR calc non Af Amer: >60 mL/min (ref >60)
Glucose, Bld: 79 mg/dL (ref 65–99)
Potassium: 3.9 mmol/L (ref 3.5–5.1)
Sodium: 143 mmol/L (ref 135–145)

## 2015-12-01 NOTE — Progress Notes (Addendum)
PROGRESS NOTE  Paula Dickson [id number] DOB: 01-Sep-1966 DOA: 11/29/2015 PCP: Arnette Norris, MD  Summary: 49 year old woman presented to the emergency department after her sister dropped her off, for evaluation of confusion. She was found to be confused with a sodium of 118. Patient suffered from mental illness for years chart and was recently released from [facility] facility. Her sister "will not stop drinking water". According to the chart she was admitted July 2016 for confusion, agitation hallucinations. She was diagnosed with altered mental status secondary to lithium and lithium was discontinued indefinitely at that time. Depacon Klonopin or Ativan at that time. At that time hyponatremia was attributed to lithium and, or hydrochlorothiazide.  Assessment/Plan: 1. Bipolar disorder with history and past of acute psychotic features. Outpatient medications include Depakote, Klonopin and Cogentin. 2. Acute encephalopathy, multifactorial including hyponatremia and bipolar disorder. 3. Hyponatremia in patient with chronic psychiatric illness and recent onset of severe polydipsia. Hyponatremia resolved with fluid restriction. Treatment for psychogenic polydipsia as water restriction. 4. "History of thyroid disease" in past medical history. Not on any medications. Unclear significance. 5. Tremor since 07/2015   Sodium is normalized. Remainder of workups unremarkable. Unknown whether the patient truly has thyroid disease or not. We will plan to check a TSH.  Continue water restriction.  Appears medically stable for transfer to inpatient psychiatric facility.  Advance diet  Psychiatry recommended continuing Depakote 1000 mg of, Klonopin 0.5 mg for anxiety Seroquel started 3 times a day anxiety inpatient psychiatric admission  Code Status: full code DVT prophylaxis: SCDs Family Communication: none present Disposition Plan: inpatient psychiatric facility  Murray Hodgkins, MD  [facility]  Pager [phone] If 7PM-7AM, please contact night-coverage at [email or web address], password [facility] 12/01/2015, 2:57 PM  LOS: 1 day   Consultants:  Psychiatry  Procedures:    Antibiotics:    HPI/Subjective: Hungry. Confused. He inquires after her father.  Objective: Filed Vitals:   11/30/15 1526 11/30/15 2139 12/01/15 0441 12/01/15 0448  BP: 153/92 145/85 151/85   Pulse: 89 67 62   Temp: 97.8 F (36.6 C) 98.2 F (36.8 C) 97.6 F (36.4 C)   TempSrc: Oral Oral Oral   Resp: 19 16 16    Height: 5\' 1"  (1.549 m)   5\' 1"  (1.549 m)  Weight: 56.8 kg (125 lb 3.5 oz)   56.4 kg (124 lb 5.4 oz)  SpO2: 100% 100% 96%     Intake/Output Summary (Last 24 hours) at 12/01/15 1457 Last data filed at 12/01/15 0831  Gross per 24 hour  Intake      0 ml  Output      0 ml  Net      0 ml     Filed Weights   11/30/15 1526 12/01/15 0448  Weight: 56.8 kg (125 lb 3.5 oz) 56.4 kg (124 lb 5.4 oz)    Exam:    General:  Appears anxious, on edge. Nontoxic. Cardiovascular: RRR, no m/r/g.  Respiratory: CTA bilaterally, no w/r/r. Normal respiratory effort. Abdomen: soft, ntnd Psychiatric: Difficult to assess mood, affect. Speech fluent and clear but not entirely appropriate. Neurologic: grossly non-focal.  New data reviewed:  Sodium 143, remainder of basic metabolic panel unremarkable  Scheduled Meds: . benztropine  0.5 mg Oral BID  . clonazePAM  0.5 mg Oral BID  . divalproex  500 mg Oral Q12H  . enoxaparin (LOVENOX) injection  40 mg Subcutaneous Q24H  . QUEtiapine  25 mg Oral TID   Continuous Infusions:   Principal Problem:   Acute psychosis  Active Problems:   HTN (hypertension)   Psychogenic polydipsia   Hyponatremia   Tremor   Schizophrenia ([place])   Acute encephalopathy   Bipolar disorder ([place])   Time spent 20 minutes

## 2015-12-01 NOTE — Consult Note (Addendum)
[facility] Consult   Reason for Consult:  Bipolar psychosis Referring Physician:  Dr. Darrick Meigs Patient Identification: Paula Dickson MRN:  [id number] Principal Diagnosis: Hyponatremia Diagnosis:   Patient Active Problem List   Diagnosis Date Noted  . 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 Hyponatremia [E87.1] 11/30/2015  . Altered mental status [R41.82] 11/30/2015  . Tremor [R25.1] 11/30/2015  . Schizophrenia ([place]) [F20.9] 11/30/2015  . Acute renal failure syndrome ([place]) [N17.9]   . Bipolar disorder with severe mania ([place]) [F31.13] 06/14/2015  . Hypokalemia [E87.6] 06/13/2015  . Acute psychosis [F29] 06/13/2015  . Hypertension, uncontrolled [I10] 06/13/2015  . HTN (hypertension) [I10] 04/02/2014  . 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 female patient admitted with bad tremors and confusion.  HPI:  Paula Dickson is a 50 y.o. female seen for face-to-face psychiatric consultation and evaluation of increased confusion, tremors, hyponatremia secondary to psychogenic polydipsia. Patient has a history of bipolar disorder and lithium therapy until July 2015. Patient was recently admitted to [facility] in August 2016 for acute bipolar psychosis and agitation. Patient has been following up with the [facility] for outpatient medication management and as per her sister's one of the medication toes and was discontinued. Patient presented to the [facility] emergency department in July 2016 with increased confusion/agitation and hallucinations and required Geodon in the emergency department. Psychiatry diagnosed AMS due to lithium and Lithium was discontinued indefinitely.Patient presented with hyponatremia at 111 secondary to polydipsia and hand tremors. Patient reported that she started drinking more water secondary to feeling nervousness and anxiety. Patient  reported her clonazepam will help her to control some of the anxiety. Patient hyponatremia has been corrected with the current fluid therapy and restriction of water. Spoke with the sister on the phone who dropped her off. She says the patient has suffered off and on with mental illness for years. She recently just got out of mental hospital in [facility] She had been doing good but over the last few days got worse. The sister says she just "won't stop drinking water". Every 5 minutes she is opening the refrigerator and drinking water, as per patient father she drank 72 times yesterday.   Past Psychiatric History: hx of bipolar/ schizophrenia/ psychosis and recent admission to [facility]. Patient reportedly receiving outpatient medication management from [facility] behavioral.   Interval history: Patient seen today for psych consultation follow up. She appeared lying on her bed with increased anxiety, tremors, confusion and preoccupied with sending inappropriate messages to her dad on computer and worried she may needs to go to jail. No family members share the information. She has been with anxious mood, labile affect and no anger out bursts. Patient has been tolerating her psych medication adjustments and positively responding with decreased stress and anxiety. She has resolved hyponatremia and currently on NPO but may needs regular diet with limited fluids to prevent additional hyponatremia and currently slightly dehydrated. Staff RN will contacted hospitalist Dr. Sarajane Jews regarding diet recommendation and preventing dehydration.   Risk to Self: Suicidal Ideation: No Suicidal Intent: No Is patient at risk for suicide?: No Suicidal Plan?: No Access to Means: No What has been your use of drugs/alcohol within the last 12 months?:  (patient denies ) How many times?:  (1x- "When I was a teenager I overdosed"') Other Self Harm Risks:  (none reported) Triggers for Past Attempts: Other (  Comment) ("My  brother called me a watch") Intentional Self Injurious Behavior: None Risk to Others: Homicidal Ideation: No Thoughts of Harm to Others: No Current Homicidal Intent: No Current Homicidal Plan: No Access to Homicidal Means: No Identified Victim:  (n/a) History of harm to others?: No Assessment of Violence: None Noted Violent Behavior Description:  (patient is calm and cooperative ) Does patient have access to weapons?: No Criminal Charges Pending?: No Does patient have a court date: No Prior Inpatient Therapy: Prior Inpatient Therapy: No Prior Therapy Dates:  (n/a) Prior Therapy Facilty/Provider(s):  (n/a) Reason for Treatment:  (n/a) Prior Outpatient Therapy: Prior Outpatient Therapy: No Prior Therapy Dates:  (n/a) Prior Therapy Facilty/Provider(s):  (n/a) Reason for Treatment:  (n/a) Does patient have an ACCT team?: No Does patient have Intensive In-House Services?  : No Does patient have [name] services? : No Does patient have P4CC services?: No  Past Medical History:  Past Medical History  Diagnosis Date  . History of arthritis   . History of chicken pox   . History of depression   . History of genital warts   . history of heart murmur   . History of high blood pressure   . History of thyroid disease   . History of UTI   . Bipolar affective disorder ([place])   . Hypertension     Past Surgical History  Procedure Laterality Date  . Cyst removal neck      around 11 years ago /benign  . Multiple tooth extractions    . Ablation on endometriosis     Family History:  Family History  Problem Relation Age of Onset  . Alcohol abuse Paternal Uncle   . Alcohol abuse Paternal Grandfather   . Arthritis Father   . Hyperlipidemia Father   . High blood pressure Father   . Breast cancer Maternal Aunt   . Breast cancer Paternal Aunt   . High blood pressure Sister   . Diabetes Sister   . Diabetes Mother   . Mental illness Other     runs in family  . Diabetes Brother   .  Mental illness Brother    Family Psychiatric  History: Brother with depression and older sister had brief depression. Social History:  History  Alcohol Use No     History  Drug Use No    Social History   Social History  . Marital Status: Single    Spouse Name: N/A  . Number of Children: N/A  . Years of Education: N/A   Social History Main Topics  . Smoking status: Never Smoker   . Smokeless tobacco: Never Used  . Alcohol Use: No  . Drug Use: No  . Sexual Activity: Not Currently   Other Topics Concern  . None   Social History Narrative   Additional Social History:    Pain Medications: SEE MAR Prescriptions: SEE MAR Over the Counter: SEE MAR History of alcohol / drug use?: No history of alcohol / drug abuse                     Allergies:  No Known Allergies  Labs:  Results for orders placed or performed during the hospital encounter of 11/29/15 (from the past 48 hour(s))  Comprehensive metabolic panel     Status: Abnormal   Collection Time: 11/29/15 10:35 PM  Result Value Ref Range   Sodium 118 (LL) 135 - 145 mmol/L    Comment: CRITICAL RESULT CALLED TO, READ BACK BY  AND VERIFIED WITH: K.DREWERY,RN AT 2307 ON 11/29/15 BY W.SHEA    Potassium 2.9 (L) 3.5 - 5.1 mmol/L   Chloride 83 (L) 101 - 111 mmol/L   CO2 24 22 - 32 mmol/L   Glucose, Bld 140 (H) 65 - 99 mg/dL   BUN 6 6 - 20 mg/dL   Creatinine, Ser 0.75 0.44 - 1.00 mg/dL   Calcium 9.3 8.9 - 10.3 mg/dL   Total Protein 6.9 6.5 - 8.1 g/dL   Albumin 4.3 3.5 - 5.0 g/dL   AST 26 15 - 41 U/L   ALT 23 14 - 54 U/L   Alkaline Phosphatase 43 38 - 126 U/L   Total Bilirubin 1.2 0.3 - 1.2 mg/dL   GFR calc non Af Amer >60 >60 mL/min   GFR calc Af Amer >60 >60 mL/min    Comment: (NOTE) The eGFR has been calculated using the CKD EPI equation. This calculation has not been validated in all clinical situations. eGFR's persistently <60 mL/min signify possible Chronic Kidney Disease.    Anion gap 11 5 - 15  CBC      Status: Abnormal   Collection Time: 11/29/15 10:35 PM  Result Value Ref Range   WBC 8.6 4.0 - 10.5 K/uL   RBC 4.41 3.87 - 5.11 MIL/uL   Hemoglobin 12.9 12.0 - 15.0 g/dL   HCT 35.9 (L) 36.0 - 46.0 %   MCV 81.4 78.0 - 100.0 fL   MCH 29.3 26.0 - 34.0 pg   MCHC 35.9 30.0 - 36.0 g/dL   RDW 12.5 11.5 - 15.5 %   Platelets 163 150 - 400 K/uL  Ethanol (ETOH)     Status: None   Collection Time: 11/29/15 10:36 PM  Result Value Ref Range   Alcohol, Ethyl (B) <5 <5 mg/dL    Comment:        LOWEST DETECTABLE LIMIT FOR SERUM ALCOHOL IS 5 mg/dL FOR MEDICAL PURPOSES ONLY   Salicylate level     Status: None   Collection Time: 11/29/15 10:36 PM  Result Value Ref Range   Salicylate Lvl <0.6 2.8 - 30.0 mg/dL  Acetaminophen level     Status: Abnormal   Collection Time: 11/29/15 10:36 PM  Result Value Ref Range   Acetaminophen (Tylenol), Serum <10 (L) 10 - 30 ug/mL    Comment:        THERAPEUTIC CONCENTRATIONS VARY SIGNIFICANTLY. A RANGE OF 10-30 ug/mL MAY BE AN EFFECTIVE CONCENTRATION FOR MANY PATIENTS. HOWEVER, SOME ARE BEST TREATED AT CONCENTRATIONS OUTSIDE THIS RANGE. ACETAMINOPHEN CONCENTRATIONS >150 ug/mL AT 4 HOURS AFTER INGESTION AND >50 ug/mL AT 12 HOURS AFTER INGESTION ARE OFTEN ASSOCIATED WITH TOXIC REACTIONS.   Valproic acid level     Status: None   Collection Time: 11/29/15 10:40 PM  Result Value Ref Range   Valproic Acid Lvl 78 50.0 - 100.0 ug/mL  Sodium     Status: Abnormal   Collection Time: 11/30/15  2:23 AM  Result Value Ref Range   Sodium 125 (L) 135 - 145 mmol/L    Comment: DELTA CHECK NOTED  Urine rapid drug screen (hosp performed) (Not at [facility])     Status: None   Collection Time: 11/30/15  4:20 AM  Result Value Ref Range   Opiates NONE DETECTED NONE DETECTED   Cocaine NONE DETECTED NONE DETECTED   Benzodiazepines NONE DETECTED NONE DETECTED   Amphetamines NONE DETECTED NONE DETECTED   Tetrahydrocannabinol NONE DETECTED NONE DETECTED   Barbiturates NONE  DETECTED NONE DETECTED  Comment:        DRUG SCREEN FOR MEDICAL PURPOSES ONLY.  IF CONFIRMATION IS NEEDED FOR ANY PURPOSE, NOTIFY LAB WITHIN 5 DAYS.        LOWEST DETECTABLE LIMITS FOR URINE DRUG SCREEN Drug Class       Cutoff (ng/mL) Amphetamine      1000 Barbiturate      200 Benzodiazepine   237 Tricyclics       628 Opiates          300 Cocaine          300 THC              50   Osmolality, urine     Status: Abnormal   Collection Time: 11/30/15  4:20 AM  Result Value Ref Range   Osmolality, Ur 53 (L) 300 - 900 mOsm/kg    Comment: Performed at [facility]  Urinalysis, Routine w reflex microscopic (not at [facility])     Status: Abnormal   Collection Time: 11/30/15  4:20 AM  Result Value Ref Range   Color, Urine YELLOW YELLOW   APPearance CLEAR CLEAR   Specific Gravity, Urine 1.004 (L) 1.005 - 1.030   pH 7.0 5.0 - 8.0   Glucose, UA NEGATIVE NEGATIVE mg/dL   Hgb urine dipstick NEGATIVE NEGATIVE   Bilirubin Urine NEGATIVE NEGATIVE   Ketones, ur NEGATIVE NEGATIVE mg/dL   Protein, ur NEGATIVE NEGATIVE mg/dL   Nitrite NEGATIVE NEGATIVE   Leukocytes, UA NEGATIVE NEGATIVE    Comment: MICROSCOPIC NOT DONE ON URINES WITH NEGATIVE PROTEIN, BLOOD, LEUKOCYTES, NITRITE, OR GLUCOSE <1000 mg/dL.  Na and K (sodium & potassium), rand urine     Status: None   Collection Time: 11/30/15  4:20 AM  Result Value Ref Range   Sodium, Ur 16 mmol/L   Potassium Urine Timed 4 mmol/L    Comment: Performed at [facility]  Sodium     Status: Abnormal   Collection Time: 11/30/15  5:35 AM  Result Value Ref Range   Sodium 132 (L) 135 - 145 mmol/L    Comment: DELTA CHECK NOTED REPEATED TO VERIFY   Basic metabolic panel     Status: Abnormal   Collection Time: 11/30/15 10:21 AM  Result Value Ref Range   Sodium 138 135 - 145 mmol/L   Potassium 3.6 3.5 - 5.1 mmol/L    Comment: REPEATED TO VERIFY DELTA CHECK NOTED NO VISIBLE HEMOLYSIS    Chloride 103 101 - 111 mmol/L   CO2 27 22 - 32  mmol/L   Glucose, Bld 92 65 - 99 mg/dL   BUN <5 (L) 6 - 20 mg/dL   Creatinine, Ser 0.84 0.44 - 1.00 mg/dL   Calcium 9.5 8.9 - 10.3 mg/dL   GFR calc non Af Amer >60 >60 mL/min   GFR calc Af Amer >60 >60 mL/min    Comment: (NOTE) The eGFR has been calculated using the CKD EPI equation. This calculation has not been validated in all clinical situations. eGFR's persistently <60 mL/min signify possible Chronic Kidney Disease.    Anion gap 8 5 - 15  Basic metabolic panel     Status: Abnormal   Collection Time: 12/01/15  5:35 AM  Result Value Ref Range   Sodium 143 135 - 145 mmol/L   Potassium 3.9 3.5 - 5.1 mmol/L   Chloride 107 101 - 111 mmol/L   CO2 28 22 - 32 mmol/L   Glucose, Bld 79 65 - 99 mg/dL   BUN  6 6 - 20 mg/dL   Creatinine, Ser 1.02 (H) 0.44 - 1.00 mg/dL   Calcium 9.5 8.9 - 10.3 mg/dL   GFR calc non Af Amer >60 >60 mL/min   GFR calc Af Amer >60 >60 mL/min    Comment: (NOTE) The eGFR has been calculated using the CKD EPI equation. This calculation has not been validated in all clinical situations. eGFR's persistently <60 mL/min signify possible Chronic Kidney Disease.    Anion gap 8 5 - 15    Current Facility-Administered Medications  Medication Dose Route Frequency Provider Last Rate Last Dose  . acetaminophen (TYLENOL) tablet 650 mg  650 mg Oral Q6H PRN Roney Jaffe, MD       Or  . acetaminophen (TYLENOL) suppository 650 mg  650 mg Rectal Q6H PRN Roney Jaffe, MD      . benztropine (COGENTIN) tablet 0.5 mg  0.5 mg Oral BID Orlie Dakin, MD   0.5 mg at 12/01/15 1030  . bisacodyl (DULCOLAX) EC tablet 5 mg  5 mg Oral Daily PRN Roney Jaffe, MD      . clonazePAM Bobbye [place]) tablet 0.5 mg  0.5 mg Oral BID Orlie Dakin, MD   0.5 mg at 12/01/15 1030  . divalproex (DEPAKOTE) DR tablet 500 mg  500 mg Oral Q12H Orlie Dakin, MD   500 mg at 12/01/15 1030  . enoxaparin (LOVENOX) injection 40 mg  40 mg Subcutaneous Q24H Roney Jaffe, MD   40 mg at 11/30/15 1800  .  promethazine (PHENERGAN) tablet 12.5 mg  12.5 mg Oral Q6H PRN Roney Jaffe, MD      . QUEtiapine (SEROQUEL) tablet 25 mg  25 mg Oral TID Ambrose Finland, MD   25 mg at 12/01/15 1030    Musculoskeletal: Strength & Muscle Tone: decreased Gait & Station: unable to stand Patient leans: N/A  Psychiatric Specialty Exam: ROS increased nervousness, anxiety disturbed sleep. Denied nausea, vomiting, abdominal pain, shortness of breath and chest pain.  Blood pressure 151/85, pulse 62, temperature 97.6 F (36.4 C), temperature source Oral, resp. rate 16, height _0  (1.549 m), weight 56.4 kg (124 lb 5.4 oz), SpO2 96 %.Body mass index is 23.51 kg/(m^2).  General Appearance: Bizarre and Guarded  Eye Contact::  Good  Speech:  Blocked, Pressured and Slurred  Volume:  Normal  Mood:  Angry, Anxious and Depressed  Affect:  Non-Congruent and Labile  Thought Process:  Disorganized, Irrelevant and Tangential  Orientation:  Full (Time, Place, and Person)  Thought Content:  Delusions and Rumination  Suicidal Thoughts:  No  Homicidal Thoughts:  No  Memory:  Immediate;   Fair Recent;   Fair  Judgement:  Fair  Insight:  Fair  Psychomotor Activity:  Restlessness  Concentration:  Fair  Recall:  [place] of Knowledge:Good  Language: Good  Akathisia:  Negative  Handed:  Right  AIMS (if indicated):     Assets:  Communication Skills Desire for Improvement Financial Resources/Insurance Housing Leisure Time Physical Health Resilience Social Support Transportation  ADL's:  Impaired  Cognition: WNL and Impaired,  Mild  Sleep:      Treatment Plan Summary: Daily contact with patient to assess and evaluate symptoms and progress in treatment and Medication management  Continue Depakote ER 1000 mg at bed time for mood stabilization Continue clonazepam 0.5 mg twice daily for anxiety Continue Seroquel 25 mg 3 times daily for controlling anxiety and mood swings Review of EKG showed no QTC  prolongation but has non specific changes and abnormal EKG  Appreciate psychiatric consultation and follow up as clinically required Please contact [phone] or [phone] if needs further assistance  Disposition: Recommend psychiatric Inpatient admission when medically cleared. Supportive therapy provided about ongoing stressors.  Rika Daughdrill,JANARDHAHA R. 12/01/2015 2:37 PM

## 2015-12-01 NOTE — Clinical Social Work Note (Signed)
CSW spoke with [facility] at [facility] [place]. There are no beds available tonight but pt will be placed on the wait list. CSW will continue to follow for d/c needs and speak with the pt at bedside in the morning.    Cindra Presume, LCSW [phone] Hospital psychiatric & 5E, 5W [id number] Licensed Clinical Social Worker

## 2015-12-01 NOTE — Care Management Note (Addendum)
Case Management Note  Patient Details  Name: Paula Dickson MRN: [id number] Date of Birth: Sep 23, 1966  Subjective/Objective:   Patient with Encephalopathy, Hypertension, Hyponatremia, Hypokalemia, and Tremors, admitted 11/29/2015. Currently being followed by Psychiatric Services for Psychogenic Polydypsia.                  Action/Plan:  Awaiting Medical Clearance for Inpatient Psychiatric Bed.  CM will continue to follow for final disposition.    Expected Discharge Date:   (unknown)               Expected Discharge Plan:     In-House Referral:     Discharge planning Services     Post Acute Care Choice:    Choice offered to:     DME Arranged:    DME Agency:     HH Arranged:    [place] Agency:     Status of Service:     Medicare Important Message Given:    Date Medicare IM Given:    Medicare IM give by:    Date Additional Medicare IM Given:    Additional Medicare Important Message give by:     If discussed at [place] of Stay Meetings, dates discussed:    Additional Comments:  Delrae Sawyers, RN 12/01/2015, 3:18 PM

## 2015-12-01 NOTE — Progress Notes (Signed)
Pending review for possible placement with [facility].   12/01/2015 Con Memos, MS, [place], LPCA Therapeutic Triage Specialist

## 2015-12-02 LAB — TSH: TSH: 0.135 u[IU]/mL — ABNORMAL LOW (ref 0.350–4.500)

## 2015-12-02 NOTE — Clinical Social Work Psych Assess (Signed)
Clinical Social Work Nature conservation officer  Clinical Social Worker:  Linna Darner, LCSW Date/Time:  12/02/2015, 11:25 AM Referred By:  Physician Date Referred:  12/02/15 Reason for Referral:      Presenting Symptoms/Problems  Presenting Symptoms/Problems(in person's/family's own words):  CSW met with pt at bedside. Pt states she was admitted to the hospital because her sodium levels were too low, "I drank a lot of water which I normally do anyway and my daddy says stay out of the refrigerator but I keep at it anyway..." Pt then goes to to speak about her father and how she can't remember the last time she saw him, however she would like to see him soon. Pt has a hx of psychotic behavior and was recently d/c from [facility], as well as another [facility] hospital which she can't remember. When questioned about why she was placed at a [facility] hospital pt states, "Both places I really didn't like. I get angry sometimes. Things don't go my way and I don't know how to control it I guess."    Abuse/Neglect/Trauma History  Abuse/Neglect/Trauma History:  Denies History    Psychiatric History  Psychiatric History:  Outpatient Treatment, Inpatient/Hospitalization Psychiatric Medication:  Pt was unaware what medication she was taking but does confirm she's been receiving med management from [facility] for "years"    Current Mental Health Hospitalizations/Previous Mental Health History: Pt reports prev outpatient tx at [facility] and a previous inpatient [place] [facility]).    Current Provider: [name]   Current Medications: Pt was unable to state what she was taking prior to being hospitalized.    Previous Inpatient Admission/Date/Reason: 07/24/15, [name], medical clearance 07/03/15, [facility], psych eval 06/13/15, [facility], Hypokalemia 06/12/15, [facility], medical clearance    Emotional Health/Current Symptoms  Suicide/Self Harm: Suicide Attempt in the Past (date/description)  ("Years ago")   Other Harmful Behavior (ex. homicidal ideation) (describe):  Denies   Psychotic/Dissociative Symptoms  Psychotic/Dissociative Symptoms: Confusion, Visual Hallucinations (Hx of seeing "graven images" ) Other Psychotic/Dissociative Symptoms:  Pt denies current VH but reports seeing "graven images" while cleaning the house. Overall, pt is fixated with religious thought.    Attention/Behavioral Symptoms  Attention/Behavioral Symptoms: Other - See Comments (circumstantial speech ) Pt requires redirection.     Cognitive Impairment  Cognitive Impairment:  Within Normal Limits    Mood and Adjustment  Mood and Adjustment:  Confused, Anxious (Pt is fixated on wanting to see her father and religious thought)   Stress, Anxiety, Trauma, Any Recent Loss/Stressor  Stress, Anxiety, Trauma, Any Recent Loss/Stressor: Anxiety (Pt wants to return home or to [facility]; pt wants to see her father as soon as possible)    Substance Abuse/Use  Substance Abuse/Use: History of Substance Use (pt reports she smoked THC "a long time ago" ) SBIRT Completed (please refer for detailed history): N/A Self-reported Substance Use (last use and frequency):  No recent use.   Urinary Drug Screen Completed: Yes Alcohol Level: N/A   Environment/Housing/Living Arrangement  Environmental/Housing/Living Arrangement: With Biological Parent(s) Who is in the Home:  Pt's biological father; her mother is deceased.   Emergency Contact:  See facesheet.    Financial  Financial: Games developer and Goals  Patient's Strengths and Goals (patient's own words):  Pt has stable housing and expressed interest in receiving inpatient [place] treatment.    Clinical Social Worker's Interpretive Summary  Clinical Social Workers Interpretive Summary:  CSW met with pt at bedside. Pt's speech was circumstantial and  she required near constant redirection. She appears fixated on religious ideas  and the idea of seeing her father today. Pt states she lives with her father but doesn't remember the last time she saw him. Psychiatry has recommended inpatient MH treatment and pt agrees; CSW will search for placement as pt is medically cleared for transfer. Pt denies current SI/HI/VH/AH/SA and is willing to go voluntary.    Disposition  Disposition: Inpatient Referral Made [facility])

## 2015-12-02 NOTE — Care Management Note (Signed)
Case Management Note  Patient Details  Name: Paula Dickson MRN: [id number] Date of Birth: 18-Sep-1966  Subjective/Objective:      Admitted with psychosis, hyponatremia              Action/Plan: Discharge planning per CSW  Expected Discharge Date:   (unknown)               Expected Discharge Plan:  Psychiatric Hospital  In-House Referral:  Clinical Social Work  Discharge planning Services  CM Consult  Post Acute Care Choice:  NA Choice offered to:  NA  DME Arranged:  N/A DME Agency:  NA  HH Arranged:  NA HH Agency:  NA  Status of Service:  Completed, signed off  Medicare Important Message Given:    Date Medicare IM Given:    Medicare IM give by:    Date Additional Medicare IM Given:    Additional Medicare Important Message give by:     If discussed at [place] of Stay Meetings, dates discussed:    Additional Comments:  Guadalupe Maple, RN 12/02/2015, 10:25 AM

## 2015-12-02 NOTE — Progress Notes (Signed)
PROGRESS NOTE  Paula Dickson [id number] DOB: 1966/06/23 DOA: 11/29/2015 PCP: Arnette Norris, MD  Summary: 50 year old woman presented to the emergency department after her sister dropped her off, for evaluation of confusion. She was found to be confused with a sodium of 118. Patient suffered from mental illness for years chart and was recently released from [facility] facility. Her sister "will not stop drinking water". According to the chart she was admitted July 2016 for confusion, agitation hallucinations. She was diagnosed with altered mental status secondary to lithium and lithium was discontinued indefinitely at that time. Patient admitted for further evaluation and treatment.  Assessment/Plan: 1. Bipolar disorder with history and past of acute psychotic features. Outpatient medications include Depakote, Klonopin and Cogentin. 2. Acute encephalopathy, multifactorial including hyponatremia and bipolar disorder. Stable now. Need inpatient psych therapy. 3. Hyponatremia in patient with chronic psychiatric illness and recent onset of severe polydipsia. Hyponatremia resolved with fluid restriction. Treatment for psychogenic polydipsia is water restriction. 4. "History of thyroid disease" in past medical history. Not on any medications. Unclear significance. 5. Tremor since 07/2015; stable and controlled.   Sodium is normalized. Remainder of workups unremarkable. Unknown whether the patient truly has thyroid disease or not. TSH is low. Will check Free T4 and T3.  Continue water restriction.  Appears medically stable for transfer to inpatient psychiatric facility when bed available.  Psychiatry recommended continuing Depakote 500mg  BID to help controlling behavior , Klonopin 0.5 mg BID for anxiety Seroquel started 3 times a day for depression  Code Status: full code DVT prophylaxis: SCDs Family Communication: none present Disposition Plan: inpatient psychiatric facility at discharge. No bed  available yet. SW on board.  Barton Dubois, MD  [facility]  Pager [phone] If 7PM-7AM, please contact night-coverage at [email or web address], password [facility] 12/02/2015, 11:27 AM  LOS: 2 days   Consultants: Psychiatry  Procedures: None   Antibiotics: None   HPI/Subjective: Oriented X 3. Denies CP or SOB. Patient is afebrile.   Objective: Filed Vitals:   12/01/15 0448 12/01/15 1500 12/01/15 2038 12/02/15 0554  BP:  143/72 146/77 151/67  Pulse:  63 84 55  Temp:  97.1 F (36.2 C) 98.1 F (36.7 C) 98 F (36.7 C)  TempSrc:  Oral Oral Oral  Resp:  16 18 18   Height: 5\' 1"  (1.549 m)     Weight: 56.4 kg (124 lb 5.4 oz)   58.3 kg (128 lb 8.5 oz)  SpO2:  100% 100% 100%    Intake/Output Summary (Last 24 hours) at 12/02/15 1127 Last data filed at 12/02/15 0900  Gross per 24 hour  Intake    480 ml  Output      0 ml  Net    480 ml     Filed Weights   11/30/15 1526 12/01/15 0448 12/02/15 0554  Weight: 56.8 kg (125 lb 3.5 oz) 56.4 kg (124 lb 5.4 oz) 58.3 kg (128 lb 8.5 oz)    Exam:    General:  Appears anxious, Nontoxic. Afebrile. AAOX3 Cardiovascular: RRR, no m/r/g.  Respiratory: CTA bilaterally, no w/r/r. Normal respiratory effort. Abdomen: soft, NT, ND, positive BS Psychiatric: Denies SI. Patient with pressure speech and fly ideas. No hallucinations and is AAOX3.Marland Kitchen Neurologic: grossly non-focal.  New data reviewed:  Last Sodium WNL now at 143, remainder of basic metabolic panel unremarkable.  Scheduled Meds: . benztropine  0.5 mg Oral BID  . clonazePAM  0.5 mg Oral BID  . divalproex  500 mg Oral Q12H  . enoxaparin (LOVENOX) injection  40 mg Subcutaneous Q24H  . QUEtiapine  25 mg Oral TID   Continuous Infusions:   Principal Problem:   Acute psychosis Active Problems:   HTN (hypertension)   Psychogenic polydipsia   Hyponatremia   Tremor   Schizophrenia (HCC)   Acute encephalopathy   Bipolar disorder ([place])   Time spent 20 minutes

## 2015-12-02 NOTE — Clinical Social Work Note (Signed)
CSW spoke with [facility] at [facility] and [facility]; no beds at [facility]. Pt is being reviewed by [facility].    Cindra Presume, LCSW [phone] Hospital psychiatric & 5E, 5W [id number] Licensed Clinical Social Worker

## 2015-12-03 DIAGNOSIS — F209 Schizophrenia, unspecified: Secondary | ICD-10-CM

## 2015-12-03 DIAGNOSIS — R631 Polydipsia: Secondary | ICD-10-CM

## 2015-12-03 DIAGNOSIS — F54 Psychological and behavioral factors associated with disorders or diseases classified elsewhere: Secondary | ICD-10-CM

## 2015-12-03 DIAGNOSIS — I10 Essential (primary) hypertension: Secondary | ICD-10-CM

## 2015-12-03 DIAGNOSIS — R251 Tremor, unspecified: Secondary | ICD-10-CM

## 2015-12-03 DIAGNOSIS — E871 Hypo-osmolality and hyponatremia: Secondary | ICD-10-CM

## 2015-12-03 DIAGNOSIS — F3111 Bipolar disorder, current episode manic without psychotic features, mild: Secondary | ICD-10-CM

## 2015-12-03 DIAGNOSIS — E0590 Thyrotoxicosis, unspecified without thyrotoxic crisis or storm: Secondary | ICD-10-CM

## 2015-12-03 DIAGNOSIS — F29 Unspecified psychosis not due to a substance or known physiological condition: Secondary | ICD-10-CM

## 2015-12-03 LAB — BASIC METABOLIC PANEL
Anion gap: 9 (ref 5–15)
BUN: 14 mg/dL (ref 6–20)
CO2: 30 mmol/L (ref 22–32)
Calcium: 9.1 mg/dL (ref 8.9–10.3)
Chloride: 104 mmol/L (ref 101–111)
Creatinine, Ser: 1 mg/dL (ref 0.44–1.00)
GFR calc Af Amer: >60 mL/min (ref >60)
GFR calc non Af Amer: >60 mL/min (ref >60)
Glucose, Bld: 86 mg/dL (ref 65–99)
Potassium: 3.6 mmol/L (ref 3.5–5.1)
Sodium: 143 mmol/L (ref 135–145)

## 2015-12-03 LAB — T4, FREE: Free T4: 1.34 ng/dL — ABNORMAL HIGH (ref 0.61–1.12)

## 2015-12-03 MED ORDER — AMLODIPINE BESYLATE 5 MG PO TABS
5.0000 mg | ORAL_TABLET | Freq: Every day | ORAL | Status: DC
  Administered 2015-12-03 – 2015-12-09 (×7): 5 mg via ORAL
  Filled 2015-12-03 (×7): qty 1

## 2015-12-03 NOTE — Progress Notes (Signed)
Pt under review for admission at [facility] and [facility]. Addt'l referrals made to: [place], MSW, LCSW Clinical Social Work, Disposition  12/03/2015 [phone]

## 2015-12-03 NOTE — Progress Notes (Signed)
Progress Note   Paula Dickson [id number] DOB: 1966-08-13 DOA: 11/29/2015 PCP: Arnette Norris, MD   Brief Narrative:   Paula Dickson is an 50 y.o. female with a PMH of bipolar disorder and psychogenic polydipsia who was admitted on 11/29/15 after her sister dropped her off for evaluation of confusion. Upon initial evaluation, serum sodium was found to be 118.  Assessment/Plan:   Principal Problem:   Acute encephalopathy secondary to hyponatremia secondary to psychogenic polydipsia - Serum sodium WNL with fluid restriction. - Medically stable for transfer to inpatient psychiatry when bed available.  Active Problems:   HTN (hypertension) - Norvasc currently on hold. Resume.    Acute psychosis in the setting of schizophrenia and bipolar disorder - Evaluated by psychiatry. Continue Depakote, Klonopin, and Seroquel per recommendations. - Transfer to inpatient psychiatric facility when bed available.    Tremor - Likely secondary to hyperthyroidism.    Low TSH/hyperthyroidism - Determine etiology.  - Rule out Graves' disease with measurement of thyrotropin receptor antibodies. - Can also perform radioactive iodine uptake or ultrasonography to measure thyroidal blood flow if Grave's disease ruled out.    DVT Prophylaxis - SCDs ordered.   Family Communication/Anticipated D/C date and plan/Code Status   Family Communication: No family at the bedside. Disposition Plan: Inpatient psych facility when bed available. Anticipated D/C date:   Stable for D/C.  Awaiting bed. Code Status: Full code.   IV Access:    Peripheral IV   Procedures and diagnostic studies:   No results found.   Medical Consultants:    Psychiatry  Anti-Infectives:   Anti-infectives    None      Subjective:   Paula Dickson is sitting up in a chair.  Loose associations, rambling speech, preoccupied with her father.  Objective:    Filed Vitals:   12/02/15 0554 12/02/15 1411 12/02/15 2048  12/03/15 0450  BP: 151/67 118/68 121/72 146/81  Pulse: 55 79 74 59  Temp: 98 F (36.7 C) 98.1 F (36.7 C) 97.6 F (36.4 C) 97.7 F (36.5 C)  TempSrc: Oral Oral Oral Oral  Resp: 18 16 16 16   Height:      Weight: 58.3 kg (128 lb 8.5 oz)     SpO2: 100% 99% 100% 100%    Intake/Output Summary (Last 24 hours) at 12/03/15 0852 Last data filed at 12/02/15 1351  Gross per 24 hour  Intake    480 ml  Output      0 ml  Net    480 ml   Filed Weights   11/30/15 1526 12/01/15 0448 12/02/15 0554  Weight: 56.8 kg (125 lb 3.5 oz) 56.4 kg (124 lb 5.4 oz) 58.3 kg (128 lb 8.5 oz)    Exam: Gen:  NAD Cardiovascular:  RRR, No M/R/G Respiratory:  Lungs CTAB Gastrointestinal:  Abdomen soft, NT/ND, + BS Extremities:  No C/E/C   Data Reviewed:    Labs: Basic Metabolic Panel:  Recent Labs Lab 11/29/15 2235 11/30/15 0223 11/30/15 0535 11/30/15 1021 12/01/15 0535 12/03/15 0530  NA 118* 125* 132* 138 143 143  K 2.9*  --   --  3.6 3.9 3.6  CL 83*  --   --  103 107 104  CO2 24  --   --  27 28 30   GLUCOSE 140*  --   --  92 79 86  BUN 6  --   --  <5* 6 14  CREATININE 0.75  --   --  0.84 1.02* 1.00  CALCIUM 9.3  --   --  9.5 9.5 9.1   GFR Estimated Creatinine Clearance: 54.6 mL/min (by C-G formula based on Cr of 1). Liver Function Tests:  Recent Labs Lab 11/29/15 2235  AST 26  ALT 23  ALKPHOS 43  BILITOT 1.2  PROT 6.9  ALBUMIN 4.3   CBC:  Recent Labs Lab 11/29/15 2235  WBC 8.6  HGB 12.9  HCT 35.9*  MCV 81.4  PLT 163   Thyroid function studies:  Recent Labs  12/02/15 0530  TSH 0.135*   Sepsis Labs:  Recent Labs Lab 11/29/15 2235  WBC 8.6     Medications:   . benztropine  0.5 mg Oral BID  . clonazePAM  0.5 mg Oral BID  . divalproex  500 mg Oral Q12H  . enoxaparin (LOVENOX) injection  40 mg Subcutaneous Q24H  . QUEtiapine  25 mg Oral TID   Continuous Infusions:   Time spent: 25 minutes.   LOS: 3 days   [facility] Pager  [phone]. If unable to reach me by pager, please call my cell phone at [phone].  *Please refer to [email or web address], password [email or web address] to get updated schedule on who will round on this patient, as hospitalists switch teams weekly. If 7PM-7AM, please contact night-coverage at [email or web address], password [email or web address] for any overnight needs.  12/03/2015, 8:52 AM

## 2015-12-04 LAB — T3, FREE: T3, Free: 2.9 pg/mL (ref 2.0–4.4)

## 2015-12-04 NOTE — Clinical Social Work Note (Signed)
CSW spoke with [facility] at [facility] who stated they are full today and no one is leaving.  Pt is on the list when someone leaves.  Van also full today.  Dede Query, LCSW [place] Worker - Weekend Coverage cell #: [phone]

## 2015-12-04 NOTE — Progress Notes (Addendum)
PROGRESS NOTE    Paula Dickson [id number] DOB: 1966/09/28 DOA: 11/29/2015 PCP: Arnette Norris, MD  HPI/Brief narrative Paula Dickson is an 50 y.o. female with a PMH of bipolar disorder and psychogenic polydipsia who was admitted on 11/29/15 after her sister dropped her off for evaluation of confusion. Upon initial evaluation, serum sodium was found to be 118.   Assessment/Plan:  Principal Problem:  Acute encephalopathy secondary to hyponatremia secondary to psychogenic polydipsia - Serum sodium WNL with fluid restriction. - Medically stable for transfer to inpatient psychiatry when bed available.  Active Problems:  HTN (hypertension) - Controlled on amlodipine.   Acute psychosis in the setting of schizophrenia and bipolar disorder - Evaluated by psychiatry. Continue Depakote, Klonopin, and Seroquel per recommendations. - Transfer to inpatient psychiatric facility when bed available. As per clinical social worker, no beds available at inpatient psychiatry.   Tremor - Likely secondary to hyperthyroidism. None seen today.   Low TSH/hyperthyroidism - Unclear etiology. [id number] (was 0.245 ~5 months ago), free T3: 2.9 & free T4: Mildly elevated at 1.34 - Rule out Graves' disease with measurement of thyrotropin receptor antibodies-pending. - Can also perform radioactive iodine uptake or ultrasonography to measure thyroidal blood flow if Grave's disease ruled out. - Clinically euthyroid. Outpatient evaluation and management.    DVT prophylaxis: SCDs & Lovenox Code Status: Full Family Communication: None at bedside Disposition Plan: DC to [facility] when bed available.   Consultants:  Psychiatry  Procedures:  None  Antibiotics:  None   Subjective: "I'm not making the right decisions". "I want to make my dad happy". "Is my dad alive?". No acute issues reported by RN.  Objective: Filed Vitals:   12/03/15 1458 12/03/15 2119 12/04/15 0520 12/04/15 1402   BP: 136/77 125/60 141/66 126/69  Pulse: 65 67 58 69  Temp: 97.8 F (36.6 C) 98.2 F (36.8 C) 98 F (36.7 C) 97.8 F (36.6 C)  TempSrc: Oral Oral Oral Oral  Resp: 17 16 16 18   Height:      Weight:   57.2 kg (126 lb 1.7 oz)   SpO2: 100% 100% 99% 100%   No intake or output data in the 24 hours ending 12/04/15 1622 Filed Weights   12/01/15 0448 12/02/15 0554 12/04/15 0520  Weight: 56.4 kg (124 lb 5.4 oz) 58.3 kg (128 lb 8.5 oz) 57.2 kg (126 lb 1.7 oz)     Exam:  General exam: Pleasant middle-aged female lying comfortably supine in bed. Respiratory system: Clear. No increased work of breathing. Cardiovascular system: S1 & S2 heard, RRR. No JVD, murmurs, gallops, clicks or pedal edema. Telemetry: SB in the 50s-SR. Gastrointestinal system: Abdomen is nondistended, soft and nontender. Normal bowel sounds heard. Central nervous system: Alert and oriented to self and place. No focal neurological deficits. Extremities: Symmetric 5 x 5 power. Psychiatry: Flat affect. No suicidal or homicidal ideations reported.   Data Reviewed: Basic Metabolic Panel:  Recent Labs Lab 11/29/15 2235 11/30/15 0223 11/30/15 0535 11/30/15 1021 12/01/15 0535 12/03/15 0530  NA 118* 125* 132* 138 143 143  K 2.9*  --   --  3.6 3.9 3.6  CL 83*  --   --  103 107 104  CO2 24  --   --  27 28 30   GLUCOSE 140*  --   --  92 79 86  BUN 6  --   --  <5* 6 14  CREATININE 0.75  --   --  0.84 1.02* 1.00  CALCIUM 9.3  --   --  9.5 9.5 9.1   Liver Function Tests:  Recent Labs Lab 11/29/15 2235  AST 26  ALT 23  ALKPHOS 43  BILITOT 1.2  PROT 6.9  ALBUMIN 4.3   No results for input(s): LIPASE, AMYLASE in the last 168 hours. No results for input(s): AMMONIA in the last 168 hours. CBC:  Recent Labs Lab 11/29/15 2235  WBC 8.6  HGB 12.9  HCT 35.9*  MCV 81.4  PLT 163   Cardiac Enzymes: No results for input(s): CKTOTAL, CKMB, CKMBINDEX, TROPONINI in the last 168 hours. BNP (last 3 results) No  results for input(s): PROBNP in the last 8760 hours. CBG: No results for input(s): GLUCAP in the last 168 hours.  No results found for this or any previous visit (from the past 240 hour(s)).         Studies: No results found.      Scheduled Meds: . amLODipine  5 mg Oral Daily  . benztropine  0.5 mg Oral BID  . clonazePAM  0.5 mg Oral BID  . divalproex  500 mg Oral Q12H  . enoxaparin (LOVENOX) injection  40 mg Subcutaneous Q24H  . QUEtiapine  25 mg Oral TID   Continuous Infusions:   Principal Problem:   Acute encephalopathy Active Problems:   HTN (hypertension)   Acute psychosis   Psychogenic polydipsia   Hyponatremia   Tremor   Schizophrenia (HCC)   Bipolar disorder ([place])   Hyperthyroidism    Time spent: 20 minutes.    Vernell Leep, MD, FACP, FHM. [facility] Pager [phone]  If 7PM-7AM, please contact night-coverage [email or web address] Password [email or web address] 12/04/2015, 4:22 PM    LOS: 4 days

## 2015-12-05 LAB — THYROTROPIN RECEPTOR AUTOABS: Thyrotropin Receptor Ab: <0.5 IU/L (ref 0.00–1.75)

## 2015-12-05 NOTE — Progress Notes (Signed)
PROGRESS NOTE    Paula Dickson [id number] DOB: Aug 07, 1966 DOA: 11/29/2015 PCP: Arnette Norris, MD  HPI/Brief narrative Paula Dickson is an 50 y.o. female with a PMH of bipolar disorder and psychogenic polydipsia who was admitted on 11/29/15 after her sister dropped her off for evaluation of confusion. Upon initial evaluation, serum sodium was found to be 118. Hyponatremia resolved. Psychiatry evaluated for bipolar disorder with psychosis and patient is awaiting transfer to [facility] when bed available.   Assessment/Plan:  Principal Problem:  Acute encephalopathy secondary to hyponatremia secondary to psychogenic polydipsia - Serum sodium WNL with fluid restriction. - Medically stable for transfer to inpatient psychiatry when bed available.  Active Problems:  HTN (hypertension) - Controlled on amlodipine.   Acute psychosis in the setting of schizophrenia and bipolar disorder - Evaluated by psychiatry. Continue Depakote, Klonopin, and Seroquel per recommendations. - Transfer to inpatient psychiatric facility when bed available. As per clinical social worker, no beds available at inpatient psychiatry.   Tremor - Likely secondary to hyperthyroidism. None seen today.   Low TSH/thyrotoxicosis-mild - Unclear etiology. [id number] (was 0.245 ~5 months ago), free T3: 2.9 & free T4: Mildly elevated at 1.34 - thyrotropin receptor antibodies-negative. - Clinically appears euthyroid. Tremors may be mild and intermittent. No tachycardia, exophthalmos or lid retraction. - Recommend close outpatient follow-up: May repeat full TFTs in 4-6 weeks and if pattern remains as above, recommend further evaluation i.e. TSI, radioactive iodine uptake scan and endocrinology consultation.    DVT prophylaxis: SCDs & Lovenox Code Status: Full Family Communication: None at bedside Disposition Plan: DC to [facility] when bed  available.   Consultants:  Psychiatry  Procedures:  None  Antibiotics:  None   Subjective: "I have a lot of things-cannot process at this time". No suicidal or homicidal ideations reported. No acute issues reported by RN.  Objective: Filed Vitals:   12/04/15 1402 12/04/15 2211 12/05/15 0630 12/05/15 1354  BP: 126/69 138/78 134/71 127/65  Pulse: 69 70 59 63  Temp: 97.8 F (36.6 C) 98 F (36.7 C) 98.1 F (36.7 C) 97.8 F (36.6 C)  TempSrc: Oral Oral Oral Oral  Resp: 18 20 20 19   Height:      Weight:   56.7 kg (125 lb)   SpO2: 100% 99% 98% 100%    Intake/Output Summary (Last 24 hours) at 12/05/15 1626 Last data filed at 12/05/15 0825  Gross per 24 hour  Intake    480 ml  Output      0 ml  Net    480 ml   Filed Weights   12/02/15 0554 12/04/15 0520 12/05/15 0630  Weight: 58.3 kg (128 lb 8.5 oz) 57.2 kg (126 lb 1.7 oz) 56.7 kg (125 lb)     Exam:  General exam: Pleasant middle-aged female lying comfortably supine in bed. Respiratory system: Clear. No increased work of breathing. Cardiovascular system: S1 & S2 heard, RRR. No JVD, murmurs, gallops, clicks or pedal edema.  Gastrointestinal system: Abdomen is nondistended, soft and nontender. Normal bowel sounds heard. Central nervous system: Alert and oriented to self and place. No focal neurological deficits. Extremities: Symmetric 5 x 5 power. Psychiatry: Flat affect. No suicidal or homicidal ideations reported.   Data Reviewed: Basic Metabolic Panel:  Recent Labs Lab 11/29/15 2235 11/30/15 0223 11/30/15 0535 11/30/15 1021 12/01/15 0535 12/03/15 0530  NA 118* 125* 132* 138 143 143  K 2.9*  --   --  3.6 3.9 3.6  CL 83*  --   --  103 107 104  CO2 24  --   --  27 28 30   GLUCOSE 140*  --   --  92 79 86  BUN 6  --   --  <5* 6 14  CREATININE 0.75  --   --  0.84 1.02* 1.00  CALCIUM 9.3  --   --  9.5 9.5 9.1   Liver Function Tests:  Recent Labs Lab 11/29/15 2235  AST 26  ALT 23  ALKPHOS 43   BILITOT 1.2  PROT 6.9  ALBUMIN 4.3   No results for input(s): LIPASE, AMYLASE in the last 168 hours. No results for input(s): AMMONIA in the last 168 hours. CBC:  Recent Labs Lab 11/29/15 2235  WBC 8.6  HGB 12.9  HCT 35.9*  MCV 81.4  PLT 163   Cardiac Enzymes: No results for input(s): CKTOTAL, CKMB, CKMBINDEX, TROPONINI in the last 168 hours. BNP (last 3 results) No results for input(s): PROBNP in the last 8760 hours. CBG: No results for input(s): GLUCAP in the last 168 hours.  No results found for this or any previous visit (from the past 240 hour(s)).         Studies: No results found.      Scheduled Meds: . amLODipine  5 mg Oral Daily  . benztropine  0.5 mg Oral BID  . clonazePAM  0.5 mg Oral BID  . divalproex  500 mg Oral Q12H  . enoxaparin (LOVENOX) injection  40 mg Subcutaneous Q24H  . QUEtiapine  25 mg Oral TID   Continuous Infusions:   Principal Problem:   Acute encephalopathy Active Problems:   HTN (hypertension)   Acute psychosis   Psychogenic polydipsia   Hyponatremia   Tremor   Schizophrenia (HCC)   Bipolar disorder ([place])   Hyperthyroidism    Time spent: 20 minutes.    Vernell Leep, MD, FACP, FHM. [facility] Pager [phone]  If 7PM-7AM, please contact night-coverage [email or web address] Password [email or web address] 12/05/2015, 4:26 PM    LOS: 5 days

## 2015-12-06 NOTE — [facility] Counselor (Signed)
Pt MRN received by LCSW Judson Roch) for referral review. [facility] has no bed availability at this time. Writer unable to contact LCSW to inform.

## 2015-12-06 NOTE — Progress Notes (Signed)
Progress Note   Paula Dickson [id number] DOB: 07/30/1966 DOA: 11/29/2015 PCP: Arnette Norris, MD   Brief Narrative:   Paula Dickson is an 50 y.o. female with a PMH of bipolar disorder and psychogenic polydipsia who was admitted on 11/29/15 after her sister dropped her off for evaluation of confusion. Upon initial evaluation, serum sodium was found to be 118.  Assessment/Plan:   Principal Problem:   Acute encephalopathy secondary to hyponatremia secondary to psychogenic polydipsia - Serum sodium WNL with fluid restriction. - Medically stable for transfer to inpatient psychiatry when bed available.  Active Problems:   HTN (hypertension) - Controlled on Norvasc.    Acute psychosis in the setting of schizophrenia and bipolar disorder - Evaluated by psychiatry. Continue Depakote, Klonopin, and Seroquel per recommendations. - Transfer to inpatient psychiatric facility when bed available.    Tremor - Likely secondary to hyperthyroidism.    Low TSH/hyperthyroidism - Determine etiology.  - Graves' disease ruled out, thyrotropin receptor antibodies were negative. - Defer further inpatient workup given that the patient appears clinically euthyroid. Repeat thyroid studies in 4-6 weeks. - Can also perform radioactive iodine uptake or ultrasonography to measure thyroidal blood flow.    DVT Prophylaxis - SCDs and Lovenox ordered.   Family Communication/Anticipated D/C date and plan/Code Status   Family Communication: No family at the bedside. Disposition Plan: Inpatient psych facility when bed available. Anticipated D/C date:   Stable for D/C.  Awaiting bed. Code Status: Full code.   IV Access:    Peripheral IV   Procedures and diagnostic studies:   No results found.   Medical Consultants:    Psychiatry  Anti-Infectives:   Anti-infectives    None      Subjective:   Paula Dickson is sitting up in a chair.  Mildly tearful, says she wants to go home and be with  her father.  Doesn't think she needs to stay in the hospital.  Objective:    Filed Vitals:   12/04/15 2211 12/05/15 0630 12/05/15 1354 12/06/15 0455  BP: 138/78 134/71 127/65 116/77  Pulse: 70 59 63 70  Temp: 98 F (36.7 C) 98.1 F (36.7 C) 97.8 F (36.6 C) 98.2 F (36.8 C)  TempSrc: Oral Oral Oral Oral  Resp: 20 20 19 20   Height:      Weight:  56.7 kg (125 lb)    SpO2: 99% 98% 100% 99%    Intake/Output Summary (Last 24 hours) at 12/06/15 0827 Last data filed at 12/06/15 0526  Gross per 24 hour  Intake    600 ml  Output      0 ml  Net    600 ml   Filed Weights   12/02/15 0554 12/04/15 0520 12/05/15 0630  Weight: 58.3 kg (128 lb 8.5 oz) 57.2 kg (126 lb 1.7 oz) 56.7 kg (125 lb)    Exam: Gen:  Tearful, mildly anxious Cardiovascular:  RRR, No M/R/G Respiratory:  Lungs CTAB Gastrointestinal:  Abdomen soft, NT/ND, + BS Extremities:  No C/E/C   Data Reviewed:    Labs: Basic Metabolic Panel:  Recent Labs Lab 11/29/15 2235 11/30/15 0223 11/30/15 0535 11/30/15 1021 12/01/15 0535 12/03/15 0530  NA 118* 125* 132* 138 143 143  K 2.9*  --   --  3.6 3.9 3.6  CL 83*  --   --  103 107 104  CO2 24  --   --  27 28 30   GLUCOSE 140*  --   --  92 79 86  BUN 6  --   --  <5* 6 14  CREATININE 0.75  --   --  0.84 1.02* 1.00  CALCIUM 9.3  --   --  9.5 9.5 9.1   GFR Estimated Creatinine Clearance: 50.2 mL/min (by C-G formula based on Cr of 1). Liver Function Tests:  Recent Labs Lab 11/29/15 2235  AST 26  ALT 23  ALKPHOS 43  BILITOT 1.2  PROT 6.9  ALBUMIN 4.3   CBC:  Recent Labs Lab 11/29/15 2235  WBC 8.6  HGB 12.9  HCT 35.9*  MCV 81.4  PLT 163   Thyroid function studies: No results for input(s): TSH, T4TOTAL, T3FREE, THYROIDAB in the last 72 hours.  Invalid input(s): FREET3 Sepsis Labs:  Recent Labs Lab 11/29/15 2235  WBC 8.6     Medications:   . amLODipine  5 mg Oral Daily  . benztropine  0.5 mg Oral BID  . clonazePAM  0.5 mg Oral BID  .  divalproex  500 mg Oral Q12H  . enoxaparin (LOVENOX) injection  40 mg Subcutaneous Q24H  . QUEtiapine  25 mg Oral TID   Continuous Infusions:   Time spent: 25 minutes.   LOS: 6 days   [facility] Pager [phone]. If unable to reach me by pager, please call my cell phone at [phone].  *Please refer to [email or web address], password [email or web address] to get updated schedule on who will round on this patient, as hospitalists switch teams weekly. If 7PM-7AM, please contact night-coverage at [email or web address], password [email or web address] for any overnight needs.  12/06/2015, 8:27 AM

## 2015-12-06 NOTE — [facility] Counselor (Signed)
Pt is denied due to being unable to accommodate 1-2-1 staffing sitter which would be required for pt acceptance per Dr.Hernandez.

## 2015-12-06 NOTE — Clinical Social Work Note (Signed)
CSW spoke with [facility] and admissions staff at [facility] who confirm no beds available today. CSW left a msg with [facility] [pt referred 1/6].   Pt is being reviewed by [facility] at [facility] for admission today. CSW will continue to follow.    Cindra Presume, LCSW [phone] Hospital psychiatric & 5E, 5W [id number] Licensed Clinical Social Worker

## 2015-12-07 LAB — CREATININE, SERUM
Creatinine, Ser: 0.93 mg/dL (ref 0.44–1.00)
GFR calc Af Amer: >60 mL/min (ref >60)
GFR calc non Af Amer: >60 mL/min (ref >60)

## 2015-12-07 NOTE — Clinical Social Work Note (Addendum)
11:00am- CSW spoke with intake staff at [facility] who confirmed there is a female bed available today and they will review pt for placement. CSW faxed over progress notes, H&P, med lists, and labs as requested by intake staff. Pt continues to be under review at [facility] as well. Pt is on the wait list at [facility].   CSW spoke to intake staff at [facility] who states the facility doesn't do direct voluntary admissions. Pt would need to utilize their walk-in clinic in order to determine if she could be placed.   2:40pm- CSW spoke with [facility] intake staff who states pt was denied for placement due to medical acuity.     Cindra Presume, LCSW [phone] Hospital psychiatric & 5E, 5W [id number] Licensed Clinical Social Worker

## 2015-12-07 NOTE — Progress Notes (Signed)
Pt. Had a witnessed fall this am while attempting to put socks on after taking bath. Pt. Stated that she felt dizzy as she was leaning over to put on socks. VS obtained no injury occurred. Floor coverage Schorr notified no new orders obtained. Safety zone portal submitted. Pt. In no distress.

## 2015-12-07 NOTE — Progress Notes (Signed)
Progress Note   Tracina Pinet [id number] DOB: Oct 18, 1966 DOA: 11/29/2015 PCP: Arnette Norris, MD   Brief Narrative:   Paula Dickson is an 50 y.o. female with a PMH of bipolar disorder and psychogenic polydipsia who was admitted on 11/29/15 after her sister dropped her off for evaluation of confusion. Upon initial evaluation, serum sodium was found to be 118.  Assessment/Plan:   Principal Problem:   Acute encephalopathy secondary to hyponatremia secondary to psychogenic polydipsia - Serum sodium WNL with fluid restriction. - Medically stable for transfer to inpatient psychiatry when bed available.  Active Problems:   HTN (hypertension) - Controlled on Norvasc.    Acute psychosis in the setting of schizophrenia and bipolar disorder - Evaluated by psychiatry. Continue Depakote, Klonopin, and Seroquel per recommendations. - Transfer to inpatient psychiatric facility when bed available.    Tremor - Likely secondary to hyperthyroidism.    Low TSH/hyperthyroidism - Graves' disease ruled out, thyrotropin receptor antibodies were negative. - Defer further inpatient workup given that the patient appears clinically euthyroid. Repeat thyroid studies in 4-6 weeks. - Can also perform radioactive iodine uptake or ultrasonography to measure thyroidal blood flow.    DVT Prophylaxis - SCDs and Lovenox ordered.   Family Communication/Anticipated D/C date and plan/Code Status   Family Communication: No family at the bedside. Disposition Plan: Inpatient psych facility when bed available. Anticipated D/C date:   Stable for D/C.  Awaiting bed. Code Status: Full code.   IV Access:    Peripheral IV   Procedures and diagnostic studies:   No results found.   Medical Consultants:    Psychiatry  Anti-Infectives:   Anti-infectives    None      Subjective:   Paula Dickson is sitting up in a chair.  Mood stable today, wants to go home.   Objective:    Filed Vitals:   12/06/15 2201 12/07/15 0503 12/07/15 0641 12/07/15 0646  BP: 140/84 113/66 146/101 119/91  Pulse: 63 72 63 93  Temp: 97.8 F (36.6 C) 97.8 F (36.6 C) 98.4 F (36.9 C)   TempSrc: Oral Oral Oral   Resp: 16 18 18    Height:      Weight:  58.8 kg (129 lb 10.1 oz)    SpO2: 95% 100% 99%    No intake or output data in the 24 hours ending 12/07/15 0826 Filed Weights   12/04/15 0520 12/05/15 0630 12/07/15 0503  Weight: 57.2 kg (126 lb 1.7 oz) 56.7 kg (125 lb) 58.8 kg (129 lb 10.1 oz)    Exam: Gen:  NAD Cardiovascular:  RRR, No M/R/G Respiratory:  Lungs CTAB Gastrointestinal:  Abdomen soft, NT/ND, + BS Extremities:  No C/E/C   Data Reviewed:    Labs: Basic Metabolic Panel:  Recent Labs Lab 11/30/15 1021 12/01/15 0535 12/03/15 0530 12/07/15 0500  NA 138 143 143  --   K 3.6 3.9 3.6  --   CL 103 107 104  --   CO2 27 28 30   --   GLUCOSE 92 79 86  --   BUN <5* 6 14  --   CREATININE 0.84 1.02* 1.00 0.93  CALCIUM 9.5 9.5 9.1  --    GFR Estimated Creatinine Clearance: 59 mL/min (by C-G formula based on Cr of 0.93). Liver Function Tests: No results for input(s): AST, ALT, ALKPHOS, BILITOT, PROT, ALBUMIN in the last 168 hours. CBC: No results for input(s): WBC, NEUTROABS, HGB, HCT, MCV, PLT in the last 168 hours. Thyroid function studies: No  results for input(s): TSH, T4TOTAL, T3FREE, THYROIDAB in the last 72 hours.  Invalid input(s): FREET3 Sepsis Labs: No results for input(s): PROCALCITON, WBC, LATICACIDVEN in the last 168 hours.   Medications:   . amLODipine  5 mg Oral Daily  . benztropine  0.5 mg Oral BID  . clonazePAM  0.5 mg Oral BID  . divalproex  500 mg Oral Q12H  . enoxaparin (LOVENOX) injection  40 mg Subcutaneous Q24H  . QUEtiapine  25 mg Oral TID   Continuous Infusions:   Time spent: 15 minutes.   LOS: 7 days   [facility] Pager [phone]. If unable to reach me by pager, please call my cell phone at [phone].  *Please  refer to [email or web address], password [email or web address] to get updated schedule on who will round on this patient, as hospitalists switch teams weekly. If 7PM-7AM, please contact night-coverage at [email or web address], password [email or web address] for any overnight needs.  12/07/2015, 8:26 AM

## 2015-12-07 NOTE — Consult Note (Signed)
[facility] Psychiatry Consult   Reason for Consult:  Bipolar psychosis Referring Physician:  Dr. Darrick Meigs Patient Identification: Paula Dickson MRN:  [id number] Principal Diagnosis: Acute encephalopathy Diagnosis:   Patient Active Problem List   Diagnosis Date Noted  . Hyperthyroidism [E05.90] 12/03/2015  . Acute encephalopathy [G93.40] 12/01/2015  . Bipolar disorder ([place]) [F31.9] 12/01/2015  . 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 Hyponatremia [E87.1] 11/30/2015  . Tremor [R25.1] 11/30/2015  . Schizophrenia ([place]) [F20.9] 11/30/2015  . Acute renal failure syndrome ([place]) [N17.9]   . Bipolar disorder with severe mania ([place]) [F31.13] 06/14/2015  . Hypokalemia [E87.6] 06/13/2015  . Acute psychosis [F29] 06/13/2015  . Hypertension, uncontrolled [I10] 06/13/2015  . HTN (hypertension) [I10] 04/02/2014  . Manic depression ([place]) [F31.9] 04/02/2014    Total Time spent with patient: 1 hour  Subjective:   Paula Dickson is a 50 y.o. female patient admitted with bad tremors and confusion.  HPI:  Paula Dickson is a 50 y.o. female seen for face-to-face psychiatric consultation and evaluation of increased confusion, tremors, hyponatremia secondary to psychogenic polydipsia. Patient has a history of bipolar disorder and lithium therapy until July 2015. Patient was recently admitted to [facility] in August 2016 for acute bipolar psychosis and agitation. Patient has been following up with the [facility] for outpatient medication management and as per her sister's one of the medication toes and was discontinued. Patient presented to the [facility] long emergency department in July 2016 with increased confusion/agitation and hallucinations and required Geodon in the emergency department. Psychiatry diagnosed AMS due to lithium and Lithium was discontinued indefinitely.Patient presented with hyponatremia at 111 secondary to polydipsia and hand tremors. Patient  reported that she started drinking more water secondary to feeling nervousness and anxiety. Patient reported her clonazepam will help her to control some of the anxiety. Patient hyponatremia has been corrected with the current fluid therapy and restriction of water. Spoke with the sister on the phone who dropped her off. She says the patient has suffered off and on with mental illness for years. She recently just got out of mental hospital in [facility]). She had been doing good but over the last few days got worse. The sister says she just "won't stop drinking water". Every 5 minutes she is opening the refrigerator and drinking water, as per patient father she drank 72 times yesterday.   Past Psychiatric History: hx of bipolar/ schizophrenia/ psychosis and recent admission to [facility]. Patient reportedly receiving outpatient medication management from [facility] behavioral.   12/07/2015  Interval history: Patient has been delusional and ruminated about she has made some kind of mistakes and her father might find out. Patient reportedly compliant with her medication management and has no reported adverse affects. Patient continued to be suffering with bipolar manic symptoms mostly disorganized thoughts, ruminations, delusions, disturbed sleep, decreased anxiety, tremors, and preoccupied with sending inappropriate messages to her dad on computer and worried she may needs to go to jail. She has been with depressed and anxious mood, labile affect and no agitation or anger out bursts. She has resolved hyponatremia and latest serum sodium 143.  Risk to Self: Suicidal Ideation: No Suicidal Intent: No Is patient at risk for suicide?: No Suicidal Plan?: No Access to Means: No What has been your use of drugs/alcohol within the last 12 months?:  (patient denies ) How many times?:  (1x- "When I was a teenager I overdosed"') Other Self Harm Risks:  (none reported) Triggers for Past Attempts: Other (  Comment)  ("My brother called me a watch") Intentional Self Injurious Behavior: None Risk to Others: Homicidal Ideation: No Thoughts of Harm to Others: No Current Homicidal Intent: No Current Homicidal Plan: No Access to Homicidal Means: No Identified Victim:  (n/a) History of harm to others?: No Assessment of Violence: None Noted Violent Behavior Description:  (patient is calm and cooperative ) Does patient have access to weapons?: No Criminal Charges Pending?: No Does patient have a court date: No Prior Inpatient Therapy: Prior Inpatient Therapy: No Prior Therapy Dates:  (n/a) Prior Therapy Facilty/Provider(s):  (n/a) Reason for Treatment:  (n/a) Prior Outpatient Therapy: Prior Outpatient Therapy: No Prior Therapy Dates:  (n/a) Prior Therapy Facilty/Provider(s):  (n/a) Reason for Treatment:  (n/a) Does patient have an ACCT team?: No Does patient have Intensive In-House Services?  : No Does patient have [name] services? : No Does patient have P4CC services?: No  Past Medical History:  Past Medical History  Diagnosis Date  . History of arthritis   . History of chicken pox   . History of depression   . History of genital warts   . history of heart murmur   . History of high blood pressure   . History of thyroid disease   . History of UTI   . Bipolar affective disorder ([place])   . Hypertension     Past Surgical History  Procedure Laterality Date  . Cyst removal neck      around 11 years ago /benign  . Multiple tooth extractions    . Ablation on endometriosis     Family History:  Family History  Problem Relation Age of Onset  . Alcohol abuse Paternal Uncle   . Alcohol abuse Paternal Grandfather   . Arthritis Father   . Hyperlipidemia Father   . High blood pressure Father   . Breast cancer Maternal Aunt   . Breast cancer Paternal Aunt   . High blood pressure Sister   . Diabetes Sister   . Diabetes Mother   . Mental illness Other     runs in family  . Diabetes Brother    . Mental illness Brother    Family Psychiatric  History: Brother with depression and older sister had brief depression. Social History:  History  Alcohol Use No     History  Drug Use No    Social History   Social History  . Marital Status: Single    Spouse Name: N/A  . Number of Children: N/A  . Years of Education: N/A   Social History Main Topics  . Smoking status: Never Smoker   . Smokeless tobacco: Never Used  . Alcohol Use: No  . Drug Use: No  . Sexual Activity: Not Currently   Other Topics Concern  . None   Social History Narrative   Additional Social History:    Pain Medications: SEE MAR Prescriptions: SEE MAR Over the Counter: SEE MAR History of alcohol / drug use?: No history of alcohol / drug abuse                     Allergies:  No Known Allergies  Labs:  Results for orders placed or performed during the hospital encounter of 11/29/15 (from the past 48 hour(s))  Creatinine, serum     Status: None   Collection Time: 12/07/15  5:00 AM  Result Value Ref Range   Creatinine, Ser 0.93 0.44 - 1.00 mg/dL   GFR calc non Af Amer >60 >60 mL/min  GFR calc Af Amer >60 >60 mL/min    Comment: (NOTE) The eGFR has been calculated using the CKD EPI equation. This calculation has not been validated in all clinical situations. eGFR's persistently <60 mL/min signify possible Chronic Kidney Disease.     Current Facility-Administered Medications  Medication Dose Route Frequency Provider Last Rate Last Dose  . acetaminophen (TYLENOL) tablet 650 mg  650 mg Oral Q6H PRN Roney Jaffe, MD      . amLODipine (NORVASC) tablet 5 mg  5 mg Oral Daily Venetia Maxon Rama, MD   5 mg at 12/07/15 1014  . benztropine (COGENTIN) tablet 0.5 mg  0.5 mg Oral BID Orlie Dakin, MD   0.5 mg at 12/07/15 1014  . bisacodyl (DULCOLAX) EC tablet 5 mg  5 mg Oral Daily PRN Roney Jaffe, MD   5 mg at 12/05/15 0926  . clonazePAM (KLONOPIN) tablet 0.5 mg  0.5 mg Oral BID Orlie Dakin,  MD   0.5 mg at 12/07/15 1014  . divalproex (DEPAKOTE) DR tablet 500 mg  500 mg Oral Q12H Orlie Dakin, MD   500 mg at 12/07/15 1014  . enoxaparin (LOVENOX) injection 40 mg  40 mg Subcutaneous Q24H Roney Jaffe, MD   40 mg at 12/06/15 1719  . promethazine (PHENERGAN) tablet 12.5 mg  12.5 mg Oral Q6H PRN Roney Jaffe, MD      . QUEtiapine (SEROQUEL) tablet 25 mg  25 mg Oral TID Ambrose Finland, MD   25 mg at 12/07/15 1014    Musculoskeletal: Strength & Muscle Tone: decreased Gait & Station: unable to stand Patient leans: N/A  Psychiatric Specialty Exam: ROS increased nervousness, anxiety disturbed sleep. Denied nausea, vomiting, abdominal pain, shortness of breath and chest pain.  Blood pressure 119/91, pulse 93, temperature 98.4 F (36.9 C), temperature source Oral, resp. rate 18, height 5' 1"  (1.549 m), weight 58.8 kg (129 lb 10.1 oz), SpO2 99 %.Body mass index is 24.51 kg/(m^2).  General Appearance: Bizarre and Guarded  Eye Contact::  Good  Speech:  Blocked, Pressured and Slurred  Volume:  Normal  Mood:  Angry, Anxious and Depressed  Affect:  Non-Congruent and Labile  Thought Process:  Disorganized, Irrelevant and Tangential  Orientation:  Full (Time, Place, and Person)  Thought Content:  Delusions and Rumination  Suicidal Thoughts:  No  Homicidal Thoughts:  No  Memory:  Immediate;   Fair Recent;   Fair  Judgement:  Fair  Insight:  Fair  Psychomotor Activity:  Restlessness  Concentration:  Fair  Recall:  [place] of Knowledge:Good  Language: Good  Akathisia:  Negative  Handed:  Right  AIMS (if indicated):     Assets:  Communication Skills Desire for Improvement Financial Resources/Insurance Housing Leisure Time Physical Health Resilience Social Support Transportation  ADL's:  Impaired  Cognition: WNL and Impaired,  Mild  Sleep:      Treatment Plan Summary: Daily contact with patient to assess and evaluate symptoms and progress in treatment and  Medication management  Continue Depakote ER 500 mg twice daily for mood stabilization, valproic acid level is 78 which is therapeutic range Continue clonazepam 0.5 mg twice daily for anxiety Continue Seroquel 25 mg 3 times daily for controlling anxiety and mood swings Continue Benztropine 0.5 mg twice daily Review of EKG showed no QTC prolongation but has non specific changes and abnormal EKG Appreciate psychiatric consultation and follow up as clinically required Please contact [phone] or [phone] if needs further assistance  Disposition: Recommend psychiatric Inpatient admission  when medically cleared. Supportive therapy provided about ongoing stressors.  Paula Dickson,JANARDHAHA R. 12/07/2015 1:28 PM

## 2015-12-08 DIAGNOSIS — G9340 Encephalopathy, unspecified: Secondary | ICD-10-CM

## 2015-12-08 MED ORDER — QUETIAPINE FUMARATE 50 MG PO TABS
50.0000 mg | ORAL_TABLET | Freq: Two times a day (BID) | ORAL | Status: DC
  Administered 2015-12-08 – 2015-12-09 (×2): 50 mg via ORAL
  Filled 2015-12-08 (×3): qty 1

## 2015-12-08 NOTE — Clinical Social Work Note (Signed)
Pt was denied by [facility] due to her acuity. Per [facility], pt will need a private room and require one on one assistance. Dr. Reynaldo Minium was made aware about this decision as pt has been on a medical floor without a sitter while waiting for a psych bed.    Cindra Presume, LCSW [phone] Hospital psychiatric & 5E, 5W [id number] Licensed Clinical Social Worker

## 2015-12-08 NOTE — BH Assessment (Signed)
Writer discussed with [facility] Psychiatrist (Dr. Bary Leriche), [facility] is unable to manage patient's level of acuity.  Writer updated [employer] Education officer, museum ([phone]).

## 2015-12-08 NOTE — Progress Notes (Signed)
Progress Note   Paula Dickson [id number] DOB: 24-Aug-1966 DOA: 11/29/2015 PCP: Arnette Norris, MD   Brief Narrative:   Paula Dickson is an 50 y.o. female with a PMH of bipolar disorder and psychogenic polydipsia who was admitted on 11/29/15 after her sister dropped her off for evaluation of confusion. Upon initial evaluation, serum sodium was found to be 118.  Assessment/Plan:   Principal Problem:   Acute encephalopathy secondary to hyponatremia secondary to psychogenic polydipsia - Serum sodium WNL with fluid restriction. - Medically stable for transfer to inpatient psychiatry when bed available.  Active Problems:   HTN (hypertension) - Controlled on Norvasc.    Acute psychosis in the setting of schizophrenia and bipolar disorder - Evaluated by psychiatry. Continue Depakote, Klonopin, and Seroquel per recommendations. - Transfer to inpatient psychiatric facility when bed available.    Tremor - Likely secondary to hyperthyroidism.    Low TSH/hyperthyroidism - Graves' disease ruled out, thyrotropin receptor antibodies were negative. - Defer further inpatient workup given that the patient appears clinically euthyroid. Repeat thyroid studies in 4-6 weeks. - Can also perform radioactive iodine uptake or ultrasonography to measure thyroidal blood flow.    DVT Prophylaxis - SCDs and Lovenox ordered.   Family Communication/Anticipated D/C date and plan/Code Status   Family Communication: No family at the bedside. Disposition Plan: Inpatient psych facility when bed available. Anticipated D/C date:   Medically Stable for D/C to inpatient psychiatry unit.  Awaiting bed. Code Status: Full code.   IV Access:    Peripheral IV   Procedures and diagnostic studies:   No results found.   Medical Consultants:    Psychiatry  Anti-Infectives:   Anti-infectives    None      Subjective:   Paula Dickson is sitting up in a chair.  Mood stable today, has no complaints.   Objective:    Filed Vitals:   12/07/15 0646 12/07/15 1518 12/08/15 0526 12/08/15 1423  BP: 119/91 110/73 123/60 125/71  Pulse: 93 68 69 78  Temp:  98.2 F (36.8 C) 98.1 F (36.7 C) 98.2 F (36.8 C)  TempSrc:  Oral Oral Oral  Resp:  18 18 18   Height:      Weight:   58.4 kg (128 lb 12 oz)   SpO2:  100% 100%     Intake/Output Summary (Last 24 hours) at 12/08/15 1505 Last data filed at 12/08/15 0841  Gross per 24 hour  Intake    240 ml  Output      0 ml  Net    240 ml   Filed Weights   12/05/15 0630 12/07/15 0503 12/08/15 0526  Weight: 56.7 kg (125 lb) 58.8 kg (129 lb 10.1 oz) 58.4 kg (128 lb 12 oz)    Exam: Gen:  NAD Cardiovascular:  RRR, No M/R/G Respiratory:  Lungs CTAB Gastrointestinal:  Abdomen soft, NT/ND, + BS Extremities:  No C/E/C   Data Reviewed:    Labs: Basic Metabolic Panel:  Recent Labs Lab 12/03/15 0530 12/07/15 0500  NA 143  --   K 3.6  --   CL 104  --   CO2 30  --   GLUCOSE 86  --   BUN 14  --   CREATININE 1.00 0.93  CALCIUM 9.1  --    GFR Estimated Creatinine Clearance: 58.7 mL/min (by C-G formula based on Cr of 0.93). Liver Function Tests: No results for input(s): AST, ALT, ALKPHOS, BILITOT, PROT, ALBUMIN in the last 168 hours. CBC: No results  for input(s): WBC, NEUTROABS, HGB, HCT, MCV, PLT in the last 168 hours. Thyroid function studies: No results for input(s): TSH, T4TOTAL, T3FREE, THYROIDAB in the last 72 hours.  Invalid input(s): FREET3 Sepsis Labs: No results for input(s): PROCALCITON, WBC, LATICACIDVEN in the last 168 hours.   Medications:   . amLODipine  5 mg Oral Daily  . benztropine  0.5 mg Oral BID  . clonazePAM  0.5 mg Oral BID  . divalproex  500 mg Oral Q12H  . enoxaparin (LOVENOX) injection  40 mg Subcutaneous Q24H  . QUEtiapine  50 mg Oral BID   Continuous Infusions:   Time spent: 15 minutes.   LOS: 8 days   [facility], Brolin Dambrosia MD [facility] Pager [phone]  *Please refer to [email or web address],  password [email or web address] to get updated schedule on who will round on this patient, as hospitalists switch teams weekly. If 7PM-7AM, please contact night-coverage at [email or web address], password [email or web address] for any overnight needs.  12/08/2015, 3:05 PM

## 2015-12-08 NOTE — Progress Notes (Signed)
Pt removed IV. IV catheter intact. MD aware of no IV access. Pt currently not receiving any IV meds.

## 2015-12-09 ENCOUNTER — Encounter: Payer: Self-pay | Admitting: Psychiatry

## 2015-12-09 ENCOUNTER — Inpatient Hospital Stay
Admission: EM | Admit: 2015-12-09 | Discharge: 2015-12-20 | DRG: 885 | Disposition: A | Discharge status: Home / Self Care | Payer: Medicaid Other | Source: Intra-hospital | Attending: Psychiatry | Admitting: Psychiatry

## 2015-12-09 DIAGNOSIS — E8881 Metabolic syndrome: Secondary | ICD-10-CM | POA: Diagnosis present

## 2015-12-09 DIAGNOSIS — E871 Hypo-osmolality and hyponatremia: Secondary | ICD-10-CM | POA: Diagnosis present

## 2015-12-09 DIAGNOSIS — F312 Bipolar disorder, current episode manic severe with psychotic features: Principal | ICD-10-CM | POA: Diagnosis present

## 2015-12-09 DIAGNOSIS — Z833 Family history of diabetes mellitus: Secondary | ICD-10-CM | POA: Diagnosis not present

## 2015-12-09 DIAGNOSIS — Z803 Family history of malignant neoplasm of breast: Secondary | ICD-10-CM

## 2015-12-09 DIAGNOSIS — Z811 Family history of alcohol abuse and dependence: Secondary | ICD-10-CM

## 2015-12-09 DIAGNOSIS — G250 Essential tremor: Secondary | ICD-10-CM | POA: Diagnosis present

## 2015-12-09 DIAGNOSIS — I10 Essential (primary) hypertension: Secondary | ICD-10-CM | POA: Diagnosis present

## 2015-12-09 DIAGNOSIS — Z87440 Personal history of urinary (tract) infections: Secondary | ICD-10-CM | POA: Diagnosis not present

## 2015-12-09 DIAGNOSIS — F54 Psychological and behavioral factors associated with disorders or diseases classified elsewhere: Secondary | ICD-10-CM | POA: Diagnosis present

## 2015-12-09 DIAGNOSIS — Z9889 Other specified postprocedural states: Secondary | ICD-10-CM | POA: Diagnosis not present

## 2015-12-09 DIAGNOSIS — G4700 Insomnia, unspecified: Secondary | ICD-10-CM | POA: Diagnosis present

## 2015-12-09 DIAGNOSIS — Z818 Family history of other mental and behavioral disorders: Secondary | ICD-10-CM | POA: Diagnosis not present

## 2015-12-09 DIAGNOSIS — E0590 Thyrotoxicosis, unspecified without thyrotoxic crisis or storm: Secondary | ICD-10-CM | POA: Diagnosis present

## 2015-12-09 DIAGNOSIS — Z915 Personal history of self-harm: Secondary | ICD-10-CM

## 2015-12-09 DIAGNOSIS — R410 Disorientation, unspecified: Secondary | ICD-10-CM

## 2015-12-09 DIAGNOSIS — R011 Cardiac murmur, unspecified: Secondary | ICD-10-CM | POA: Diagnosis present

## 2015-12-09 DIAGNOSIS — Z9119 Patient's noncompliance with other medical treatment and regimen: Secondary | ICD-10-CM

## 2015-12-09 DIAGNOSIS — M1990 Unspecified osteoarthritis, unspecified site: Secondary | ICD-10-CM | POA: Diagnosis present

## 2015-12-09 DIAGNOSIS — R631 Polydipsia: Secondary | ICD-10-CM

## 2015-12-09 HISTORY — DX: Schizophrenia, unspecified: F20.9

## 2015-12-09 MED ORDER — QUETIAPINE FUMARATE 50 MG PO TABS: 50.0000 mg | ORAL_TABLET | Freq: Two times a day (BID) | ORAL | Status: DC

## 2015-12-09 MED ORDER — TRAZODONE HCL 100 MG PO TABS: 100.0000 mg | ORAL_TABLET | Freq: Every evening | ORAL | Status: DC | PRN

## 2015-12-09 MED ORDER — DIVALPROEX SODIUM 500 MG PO DR TAB
500.0000 mg | DELAYED_RELEASE_TABLET | Freq: Three times a day (TID) | ORAL | Status: DC
  Administered 2015-12-10 (×2): 500 mg via ORAL
  Filled 2015-12-09 (×2): qty 1

## 2015-12-09 MED ORDER — ALUM & MAG HYDROXIDE-SIMETH 200-200-20 MG/5ML PO SUSP: 30.0000 mL | ORAL | Status: DC | PRN

## 2015-12-09 MED ORDER — AMLODIPINE BESYLATE 5 MG PO TABS
5.0000 mg | ORAL_TABLET | Freq: Every day | ORAL | Status: DC
  Administered 2015-12-10 – 2015-12-19 (×10): 5 mg via ORAL
  Filled 2015-12-09 (×10): qty 1

## 2015-12-09 MED ORDER — RISPERIDONE 1 MG PO TABS
2.0000 mg | ORAL_TABLET | Freq: Two times a day (BID) | ORAL | Status: DC
  Administered 2015-12-10 – 2015-12-13 (×8): 2 mg via ORAL
  Filled 2015-12-09 (×8): qty 2

## 2015-12-09 MED ORDER — MAGNESIUM HYDROXIDE 400 MG/5ML PO SUSP: 30.0000 mL | Freq: Every day | ORAL | Status: DC | PRN

## 2015-12-09 NOTE — Progress Notes (Signed)
CSW spoke with Calvin/TTS of [facility] who states that the patient has been accepted to [facility] and is welcomed to come to their facility after 9:00pm.   TTS confirms that he has received voluntary paperwork.  CSW reached out to [facility] and spoke with Vaughan Basta and informed her that patient needs transportation to facility after 9:00pm. She states she will transport patient. CSW made nurse aware.  Willette Brace [id number] ED CSW 12/09/2015 7:40 PM

## 2015-12-09 NOTE — Clinical Social Work Note (Signed)
Pt has been accepted at [facility]. CSW faxed voluntary form to [facility]. Discharge time is currently [facility] as [facility] has limited staff right now. CSW will continue to follow. Pt will need transportation via [facility].    Cindra Presume, LCSW [phone] Hospital psychiatric & 5E, 5W [id number] Licensed Clinical Social Worker

## 2015-12-09 NOTE — Progress Notes (Signed)
   12/09/15 1200  Clinical Encounter Type  Visited With Patient  Visit Type Initial;Behavioral Health;Psychological support;Spiritual support;Social support  Referral From Patient  Consult/Referral To Nurse  Spiritual Encounters  Spiritual Needs Emotional;Prayer  Stress Factors  Patient Stress Factors Lack of knowledge;Health changes   Chaplain support at pt request.  Paula Dickson requested prayers for herself and her father.  Began to describe that she had posted things online and was worried her father was disappointed in her.  This is consistent with other notes.  Did not ruminate, as she was redirected by drawing attention toward her care for her father.   Paula Dickson reports that she lives with her father and that her sister comes to check on them and bring things a couple times a week - typically Wednesday.  [name] reported being worried for her father and concerned that her father knows she is in the hospital again.  She reports "I have spent a lot of my time in hospitals," and stated she is fearful of where she will go next, as she had experiences at [facility] that were frightening for her.  Chaplain provided support around process and shared prayers with [name] at her request.    She reports 7 brothers and sisters and several close friends with whom she enjoys going to see movies and going to [employer].    [place], [place]

## 2015-12-09 NOTE — Progress Notes (Addendum)
Patient's condition stable and unchanged. Patient is pleasant and ready to [facility]. [name] is here and is taking patient via wheelchair now.  Was told by previous RN that patient has had IV d/c'd, [name] has received report and patient ready for transfer at [facility], Paula Dickson

## 2015-12-09 NOTE — BH Assessment (Signed)
Received phone call from Dr. Reynaldo Minium, in regards to the patient being admitted to [facility]. Writer informed him, this will need to be a doctor to doctor conversation. Informed him, he will give his contact information to the attending physician on the unit.  Writer informed [facility] Psychiatrist (Dr. Bary Leriche) of the phone call with Dr. Reynaldo Minium, provided her with his contact information, to follow up with him.

## 2015-12-09 NOTE — Clinical Social Work Note (Signed)
CSW spoke with admission staff at [facility]; pt is under review for placement. Of note, [facility] is also reviewing several sister hospital pt's which have priority.   CSW spoke with [facility] admission; [facility] bed status still [facility]. CSW will continue to follow-up today.  Per [facility] at [facility], bed status is currently [facility]. Pt will be reviewed for placement if there are discharges on the 500 hall.   As prev discussed with Dr. Reynaldo Minium, pt was prev denied by [facility]. Per [facility], this is now a doctor to doctor consult. CSW will continue to check for updates.   Cindra Presume, LCSW [phone] Hospital psychiatric & 5E, 5W [id number] Licensed Clinical Social Worker

## 2015-12-09 NOTE — Discharge Summary (Signed)
Physician Discharge Summary  Paula Dickson [id number] DOB: June 16, 1966 DOA: 11/29/2015  PCP: Arnette Norris, MD  Admit date: 11/29/2015 Discharge date: 12/09/2015   Recommendations for Outpatient Follow-Up:   1. The patient is being discharged to [facility] for ongoing psychiatric care. 2. Repeat thyroid studies in 4-6 weeks.   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  Diet recommendation: Regular.   History of Present Illness:   Paula Dickson is an 50 y.o. female with a PMH of bipolar disorder and psychogenic polydipsia who was admitted on 11/29/15 after her sister dropped her off for evaluation of confusion. Upon initial evaluation, serum sodium was found to be 118.  Hospital Course by Problem:   Principal Problem:  Acute encephalopathy secondary to hyponatremia secondary to psychogenic polydipsia - Serum sodium WNL with fluid restriction. - Medically stable for transfer to inpatient psychiatry when bed available.  Active Problems:  HTN (hypertension) - Controlled on Norvasc.   Acute psychosis in the setting of schizophrenia and bipolar disorder - Evaluated by psychiatry. Continue Depakote, Klonopin, and Seroquel per recommendations. - Transfer to inpatient psychiatric facility when bed available.   Tremor - Resolved.   Low TSH/hyperthyroidism - Graves' disease ruled out, thyrotropin receptor antibodies were negative. - Defer further inpatient workup given that the patient appears clinically euthyroid. Repeat thyroid studies in 4-6 weeks. - Can also perform radioactive iodine uptake or ultrasonography to  measure thyroidal blood flow.    Medical Consultants:    Psychiatry   Discharge Exam:   Filed Vitals:   12/08/15 2040 12/09/15 0541  BP: 131/76 131/64  Pulse: 97 52  Temp: 97.3 F (36.3 C) 97.9 F (36.6 C)  Resp: 18 16   Filed Vitals:   12/08/15 1423 12/08/15 2040 12/09/15 0541 12/09/15 0705  BP: 125/71 131/76 131/64   Pulse: 78 97 52   Temp: 98.2 F (36.8 C) 97.3 F (36.3 C) 97.9 F (36.6 C)   TempSrc: Oral Oral Oral   Resp: 18 18 16    Height:      Weight:    57.3 kg (126 lb 5.2 oz)  SpO2:  97% 99%     Gen:  NAD Cardiovascular:  RRR, No M/R/G Respiratory: Lungs CTAB Gastrointestinal: Abdomen soft, NT/ND with normal active bowel sounds. Extremities: No C/E/C   The results of significant diagnostics from this hospitalization (including imaging, microbiology, ancillary and laboratory) are listed below for reference.     Procedures and Diagnostic Studies:   No results found.   Labs:   Basic Metabolic Panel:  Recent Labs Lab 12/03/15 0530 12/07/15 0500  NA 143  --   K 3.6  --   CL 104  --   CO2 30  --   GLUCOSE 86  --   BUN 14  --   CREATININE 1.00 0.93  CALCIUM 9.1  --    GFR Estimated Creatinine Clearance: 54 mL/min (by C-G formula based on Cr of 0.93). Liver Function Tests: No results for input(s): AST, ALT, ALKPHOS, BILITOT, PROT, ALBUMIN in the last 168 hours. No results for input(s): LIPASE, AMYLASE in the last 168 hours. No results for input(s):  AMMONIA in the last 168 hours. Coagulation profile No results for input(s): INR, PROTIME in the last 168 hours.  CBC: No results for input(s): WBC, NEUTROABS, HGB, HCT, MCV, PLT in the last 168 hours. Cardiac Enzymes: No results for input(s): CKTOTAL, CKMB, CKMBINDEX, TROPONINI in the last 168 hours. BNP: Invalid input(s): POCBNP CBG: No results for input(s): GLUCAP in the last 168 hours. D-Dimer No results for input(s): DDIMER in the last 72 hours. Hgb A1c No results for input(s):  HGBA1C in the last 72 hours. Lipid Profile No results for input(s): CHOL, HDL, LDLCALC, TRIG, CHOLHDL, LDLDIRECT in the last 72 hours. Thyroid function studies No results for input(s): TSH, T4TOTAL, T3FREE, THYROIDAB in the last 72 hours.  Invalid input(s): FREET3 Anemia work up No results for input(s): VITAMINB12, FOLATE, FERRITIN, TIBC, IRON, RETICCTPCT in the last 72 hours. Microbiology No results found for this or any previous visit (from the past 240 hour(s)).   Discharge Instructions:   Discharge Instructions    Call MD for:    Complete by:  As directed   Suicidal thoughts, feelings, hallucinations.     Diet general    Complete by:  As directed      Increase activity slowly    Complete by:  As directed             Medication List    TAKE these medications        amLODipine 5 MG tablet  Commonly known as:  NORVASC  Take 1 tablet (5 mg total) by mouth daily.     benztropine 0.5 MG tablet  Commonly known as:  COGENTIN  Take 0.5 mg by mouth 2 (two) times daily.     clonazePAM 0.5 MG tablet  Commonly known as:  KLONOPIN  Take 1 tablet (0.5 mg total) by mouth 2 (two) times daily.     divalproex 500 MG DR tablet  Commonly known as:  DEPAKOTE  Take 1 tablet (500 mg total) by mouth every 12 (twelve) hours.     QUEtiapine 50 MG tablet  Commonly known as:  SEROQUEL  Take 1 tablet (50 mg total) by mouth 2 (two) times daily.           Follow-up Information    Schedule an appointment as soon as possible for a visit with Arnette Norris, MD.   Specialty:  Family Medicine   Why:  If symptoms worsen   Contact information:   [place] [phone]        Time coordinating discharge: 25 minutes.  Signed:  RAMA,CHRISTINA  Pager [phone] [facility] 12/09/2015, 2:03 PM

## 2015-12-09 NOTE — BH Assessment (Signed)
Patient has been accepted to [facility].  Accepting physician is Dr. Bary Leriche.  Attending Physician will be Dr. Bary Leriche.  Patient has been assigned to room 302, by [place].  Call report to [phone].  Representative/Transfer Coordinator is Macai Sisneros.  Voluntary admission papers received via faxed.  [facility] Staff (Sarah, Social Worker & Lillia Pauls, RN) made aware of acceptance.   Patient is able to transfer to [place] [facility] after 9:00pm That's when the bed will be available.

## 2015-12-09 NOTE — Discharge Instructions (Signed)
Hyponatremia °Hyponatremia is when the amount of salt (sodium) in your blood is too low. When sodium levels are low, your cells absorb extra water and they swell. The swelling happens throughout the body, but it mostly affects the brain. °CAUSES °This condition may be caused by: °· Heart, kidney, or liver problems. °· Thyroid problems. °· Adrenal gland problems. °· Metabolic conditions, such as syndrome of inappropriate antidiuretic hormone (SIADH). °· Severe vomiting and diarrhea. °· Certain medicines or illegal drugs. °· Dehydration. °· Drinking too much water. °· Eating a diet that is low in sodium. °· Large burns on your body. °· Sweating. °RISK FACTORS °This condition is more likely to develop in people who: °· Have long-term (chronic) kidney disease. °· Have heart failure. °· Have a medical condition that causes frequent or excessive diarrhea. °· Have metabolic conditions, such as Addison disease or SIADH. °· Take certain medicines that affect the sodium and fluid balance in the blood. Some of these medicine types include: °¨ Diuretics. °¨ NSAIDs. °¨ Some opioid pain medicines. °¨ Some antidepressants. °¨ Some seizure prevention medicines. °SYMPTOMS  °Symptoms of this condition include: °· Nausea and vomiting. °· Confusion. °· Lethargy. °· Agitation. °· Headache. °· Seizures. °· Unconsciousness. °· Appetite loss. °· Muscle weakness and cramping. °· Feeling weak or light-headed. °· Having a rapid heart rate. °· Fainting, in severe cases. °DIAGNOSIS °This condition is diagnosed with a medical history and physical exam. You will also have other tests, including: °· Blood tests. °· Urine tests. °TREATMENT °Treatment for this condition depends on the cause. Treatment may include: °· Fluids given through an IV tube that is inserted into one of your veins. °· Medicines to correct the sodium imbalance. If medicines are causing the condition, the medicines will need to be adjusted. °· Limiting water or fluid intake to  get the correct sodium balance. °HOME CARE INSTRUCTIONS °· Take medicines only as directed by your health care provider. Many medicines can make this condition worse. Talk with your health care provider about any medicines that you are currently taking. °· Carefully follow a recommended diet as directed by your health care provider. °· Carefully follow instructions from your health care provider about fluid restrictions. °· Keep all follow-up visits as directed by your health care provider. This is important. °· Do not drink alcohol. °SEEK MEDICAL CARE IF: °· You develop worsening nausea, fatigue, headache, confusion, or weakness. °· Your symptoms go away and then return. °· You have problems following the recommended diet. °SEEK IMMEDIATE MEDICAL CARE IF: °· You have a seizure. °· You faint. °· You have ongoing diarrhea or vomiting. °  °This information is not intended to replace advice given to you by your health care provider. Make sure you discuss any questions you have with your health care provider. °  °Document Released: 11/03/2002 Document Revised: 03/30/2015 Document Reviewed: 12/03/2014 °Elsevier Interactive Patient Education ©2016 [employer] ° ° ° °

## 2015-12-10 DIAGNOSIS — F312 Bipolar disorder, current episode manic severe with psychotic features: Principal | ICD-10-CM

## 2015-12-10 LAB — CBC
HCT: 39.9 % (ref 35.0–47.0)
Hemoglobin: 13.3 g/dL (ref 12.0–16.0)
MCH: 28.4 pg (ref 26.0–34.0)
MCHC: 33.4 g/dL (ref 32.0–36.0)
MCV: 85 fL (ref 80.0–100.0)
Platelets: 121 10*3/uL — ABNORMAL LOW (ref 150–440)
RBC: 4.7 MIL/uL (ref 3.80–5.20)
RDW: 14.4 % (ref 11.5–14.5)
WBC: 4.6 10*3/uL (ref 3.6–11.0)

## 2015-12-10 LAB — COMPREHENSIVE METABOLIC PANEL
ALT: 26 U/L (ref 14–54)
AST: 16 U/L (ref 15–41)
Albumin: 3.8 g/dL (ref 3.5–5.0)
Alkaline Phosphatase: 30 U/L — ABNORMAL LOW (ref 38–126)
Anion gap: 3 — ABNORMAL LOW (ref 5–15)
BUN: 23 mg/dL — ABNORMAL HIGH (ref 6–20)
CO2: 32 mmol/L (ref 22–32)
Calcium: 9.9 mg/dL (ref 8.9–10.3)
Chloride: 105 mmol/L (ref 101–111)
Creatinine, Ser: 0.9 mg/dL (ref 0.44–1.00)
GFR calc Af Amer: >60 mL/min (ref >60)
GFR calc non Af Amer: >60 mL/min (ref >60)
Glucose, Bld: 105 mg/dL — ABNORMAL HIGH (ref 65–99)
Potassium: 4.4 mmol/L (ref 3.5–5.1)
Sodium: 140 mmol/L (ref 135–145)
Total Bilirubin: 0.7 mg/dL (ref 0.3–1.2)
Total Protein: 6.7 g/dL (ref 6.5–8.1)

## 2015-12-10 LAB — LIPID PANEL
Cholesterol: 152 mg/dL (ref 0–200)
HDL: 49 mg/dL (ref >40)
LDL Cholesterol: 84 mg/dL (ref 0–99)
Total CHOL/HDL Ratio: 3.1 RATIO
Triglycerides: 97 mg/dL (ref <150)
VLDL: 19 mg/dL (ref 0–40)

## 2015-12-10 LAB — TSH: TSH: 0.27 u[IU]/mL — ABNORMAL LOW (ref 0.350–4.500)

## 2015-12-10 LAB — AMMONIA: Ammonia: 40 umol/L — ABNORMAL HIGH (ref 9–35)

## 2015-12-10 LAB — HEMOGLOBIN A1C: Hgb A1c MFr Bld: 5.3 % (ref 4.0–6.0)

## 2015-12-10 LAB — VALPROIC ACID LEVEL: Valproic Acid Lvl: 125 ug/mL — ABNORMAL HIGH (ref 50.0–100.0)

## 2015-12-10 NOTE — BHH Suicide Risk Assessment (Signed)
[place] INPATIENT:  Family/Significant Other Suicide Prevention Education  Suicide Prevention Education:  Education Completed; Lynette Pettit, [phone] has been identified by the patient as the family member/significant other with whom the patient will be residing, and identified as the person(s) who will aid the patient in the event of a mental health crisis (suicidal ideations/suicide attempt).  With written consent from the patient, the family member/significant other has been provided the following suicide prevention education, prior to the and/or following the discharge of the patient.  The suicide prevention education provided includes the following:  Suicide risk factors  Suicide prevention and interventions  National Suicide Hotline telephone number  [facility] assessment telephone number  [facility] Emergency Assistance [place] and/or Residential Mobile Crisis Unit telephone number  Request made of family/significant other to:  Remove weapons (e.g., guns, rifles, knives), all items previously/currently identified as safety concern.    Remove drugs/medications (over-the-counter, prescriptions, illicit drugs), all items previously/currently identified as a safety concern.  The family member/significant other verbalizes understanding of the suicide prevention education information provided.  The family member/significant other agrees to remove the items of safety concern listed above.  [place] MSW, [place]  12/10/2015, 11:47 AM

## 2015-12-10 NOTE — Plan of Care (Signed)
Problem: Ineffective individual coping Goal: STG: Patient will remain free from self harm Outcome: Progressing Pt denies SI and remains free from self harm.

## 2015-12-10 NOTE — BHH Group Notes (Signed)
[place] Group Notes:  (Nursing/MHT/Case Management/Adjunct)  Date:  12/10/2015  Time:  12:59 PM  Type of Therapy:  Movement Therapy  Participation Level:  Did Not Attend  Jeremie Giangrande De'Chelle Dasie Chancellor 12/10/2015, 12:59 PM

## 2015-12-10 NOTE — Progress Notes (Signed)
D: Patient denies SI/HI/AVH. Patient and mood are anxious.  Patient is cooperative and interacts appropriately with staff.   Patient visible on the milieu. No distress noted. A: Support and encouragement offered. Scheduled medications given to pt. Q 15 min checks continued for patient safety. R: Patient receptive. Patient remains safe on the unit.

## 2015-12-10 NOTE — Tx Team (Signed)
Initial Interdisciplinary Treatment Plan   PATIENT STRESSORS: Medication change or noncompliance   PATIENT STRENGTHS: Average or above average intelligence Motivation for treatment/growth Religious Affiliation Supportive family/friends   PROBLEM LIST: Problem List/Patient Goals Date to be addressed Date deferred Reason deferred Estimated date of resolution  Confusion 12/10/2015     Drinking water excessivley 12/10/2015     Not eating 12/10/2015     "want to learn how to process anger"                                     DISCHARGE CRITERIA:  Improved stabilization in mood, thinking, and/or behavior Motivation to continue treatment in a less acute level of care  PRELIMINARY DISCHARGE PLAN: Return to previous living arrangement  PATIENT/FAMIILY INVOLVEMENT: This treatment plan has been presented to and reviewed with the patient, Paula Dickson.  The patient and family have been given the opportunity to ask questions and make suggestions.  Laverle Patter Kortney Potvin 12/10/2015, 5:29 AM
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
## 2015-12-10 NOTE — Progress Notes (Signed)
Recreation Therapy Notes  Patient's social worker informed LRT that patient was not appropriate for assessment at this time as patient is disorganized.   Leonette [name], LRT/CTRS 12/10/2015 2:24 PM

## 2015-12-10 NOTE — BHH Suicide Risk Assessment (Addendum)
[facility] Admission Suicide Risk Assessment   Nursing information obtained from:  Patient Demographic factors:  Unemployed Current Mental Status:  NA (Denies SI) Loss Factors:  NA Historical Factors:  Prior suicide attempts, Impulsivity Risk Reduction Factors:  Sense of responsibility to family, Religious beliefs about death, Living with another person, especially a relative, Positive social support Total Time spent with patient: 1 hour Principal Problem: Bipolar I disorder, most recent episode manic, severe with psychotic features ([place]) Diagnosis:   Patient Active Problem List   Diagnosis Date Noted  . Hyperthyroidism [E05.90] 12/03/2015  . Acute encephalopathy [G93.40] 12/01/2015  . 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 Hyponatremia [E87.1] 11/30/2015  . Acute renal failure syndrome ([place]) [N17.9]   . Bipolar I disorder, most recent episode manic, severe with psychotic features ([place]) [F31.2] 06/14/2015  . Hypokalemia [E87.6] 06/13/2015  . HTN (hypertension) [I10] 04/02/2014     Continued Clinical Symptoms:  Alcohol Use Disorder Identification Test Final Score (AUDIT): 0 The "Alcohol Use Disorders Identification Test", Guidelines for Use in Primary Care, Second Edition.  World Pharmacologist [facility]). Score between 0-7:  no or low risk or alcohol related problems. Score between 8-15:  moderate risk of alcohol related problems. Score between 16-19:  high risk of alcohol related problems. Score 20 or above:  warrants further diagnostic evaluation for alcohol dependence and treatment.   CLINICAL FACTORS:   Severe Anxiety and/or Agitation Bipolar Disorder:   Mixed State   Musculoskeletal: Strength & Muscle Tone: within normal limits Gait & Station: normal Patient leans: N/A  Psychiatric Specialty Exam: I reviewed physical exam performed on the medical floor with the findings.  Physical Exam  Nursing note and vitals reviewed.   Review of Systems  Constitutional:  Positive for weight loss.  Neurological: Positive for tremors and weakness.  All other systems reviewed and are negative.   Blood pressure 118/70, pulse 87, temperature 98.6 F (37 C), temperature source Oral, resp. rate 20, height 5\' 1"  (1.549 m), weight 57.153 kg (126 lb).Body mass index is 23.82 kg/(m^2).  General Appearance: Casual  Eye Contact::  Good  Speech:  Clear and Coherent  Volume:  Normal  Mood:  Dysphoric  Affect:  Congruent  Thought Process:  Circumstantial  Orientation:  Full (Time, Place, and Person)  Thought Content:  Delusions, Hallucinations: Auditory and Paranoid Ideation  Suicidal Thoughts:  No  Homicidal Thoughts:  No  Memory:  Immediate;   Fair Recent;   Fair Remote;   Fair  Judgement:  Fair  Insight:  Shallow  Psychomotor Activity:  Normal  Concentration:  Poor  Recall:  Poor  Fund of Knowledge:Poor  Language: Fair  Akathisia:  No  Handed:  Right  AIMS (if indicated):     Assets:  Communication Skills Desire for Improvement Housing Resilience Social Support  Sleep:  Number of Hours: 4.25  Cognition: WNL  ADL's:  Intact     COGNITIVE FEATURES THAT CONTRIBUTE TO RISK:  None    SUICIDE RISK:   Moderate:  Frequent suicidal ideation with limited intensity, and duration, some specificity in terms of plans, no associated intent, good self-control, limited dysphoria/symptomatology, some risk factors present, and identifiable protective factors, including available and accessible social support.  PLAN OF CARE: Hospital admission, medication management, discharge planning.  Medical Decision Making:  New problem, with additional work up planned, Review of Psycho-Social Stressors (1), Review or order clinical lab tests (1), Review of Medication Regimen & Side Effects (2) and Review of New Medication or Change in  Dosage (2)   Ms. Bendorf is a 50 year old female with a history of bipolar illness transferred from medical floor of [facility] where she was  hospitalized for polydipsia and low sodium to treat psychotic disorganization and aggressive behavior in the context of treatment noncompliance.  1. Agitated behavior. This has resolved.  2. Mood. She was started on Depakote and low-dose Seroquel at [place],. Her Depakote is elevated at 125. We are holding Depakote and check ammonia. The patient did exceptionally well on the lithium for many years. This was recently discontinued for unknown reasons. We will consider restarting lithium.  3. Hypertension. She is on Norvasc.  4. Hyperthyroidism. We will ask endocrinology consult. The patient has tremor. It could be from hyperactive thyroid, lithium or Depakote, on EPS from antipsychotics.  5. Hyponatremia. This has resolved. The patient no longer drinks water excessively.  6. Insomnia. She slept 4 hours with trazodone.  7. Disposition. She will be discharged to home with her father. She will follow up with her regular psychiatrist in [place].  I certify that inpatient services furnished can reasonably be expected to improve the patient's condition.   Tyrann Donaho 12/10/2015, 12:23 PM

## 2015-12-10 NOTE — Progress Notes (Signed)
Recreation Therapy Notes  Date: 01.13.17 Time: 3:15 pm Location: Craft Room  Group Topic: Communication, Problem Solving, Teamwork  Goal Area(s) Addresses:  Patient will effectively work with peer towards shared goal. Patient will identify skills used to make activity successful. Patient will identify benefit of using group skills effectively post d/c.  Behavioral Response: Did not attend   Intervention: [employer]  Activity: Patients were given 15 pipe cleaners and instructed to build a free standing tower. After about 6 minutes of them building, patients were instructed to put their dominant hand behind their backs.  Education: LRT educated patients on how they can use these skills outside of the hospital.  Education Outcome: Patient did not attend group.  Clinical Observations/Feedback: Patient did not attend group.  Leonette [name], LRT/CTRS 12/10/2015 4:21 PM

## 2015-12-10 NOTE — Progress Notes (Signed)
D: Received pt from [place] medical floor. Pt was medically cleared to come to [facility] after being hyponatremic and hypokalemic on admission at [facility]. Pt is exhibiting mild upper extremity tremors, mild confusion, and disorganized thought process. Pt has difficulty identifying the reason for her admission. Pt content is circumstantial with pt brining up church multiple times. Pt appears suspicious and guarded asking multiple times during assessment if "information will be held against me" and reassuring Probation officer that her father treats her well. Pt indicates that her sister brought her here because she has been acting differently including: "I was rough housing with my dad and I never do that.Rene Paci been drinking a lot of water.Rene Paci been staying at home more." Pt also endorsed feeling more confused recently. Patient alert and oriented x4. Patient denies SI/HI/AVH. Pt affect is flat. Pt mentioned "wanting to learn how to process anger" although there are no current signs of anger or irritability. Skin is WNL and no contraband found. Pt unsure if she has been taking meds regularly at home. A: Skin check performed with Clifton Custard. Admission packet reviewed with pt. Pt educated on unit policy. Pt oriented to unit. Offered active listening and support. Provided therapeutic communication. Administered scheduled medications.  R: Pt took extended time to read admission material thoroughly before signing. Pt pleasant and cooperative. Pt compliant with medications. Will continue Q15 min. Checks. Safety maintained.
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
## 2015-12-11 LAB — HEPATITIS PANEL, ACUTE
HCV Ab: 0.1 s/co ratio (ref 0.0–0.9)
Hep A IgM: NEGATIVE
Hep B C IgM: NEGATIVE
Hepatitis B Surface Ag: NEGATIVE

## 2015-12-11 LAB — HIV ANTIBODY (ROUTINE TESTING W REFLEX): HIV Screen 4th Generation wRfx: NONREACTIVE

## 2015-12-11 LAB — AMMONIA: Ammonia: 38 umol/L — ABNORMAL HIGH (ref 9–35)

## 2015-12-11 LAB — VALPROIC ACID LEVEL: Valproic Acid Lvl: 68 ug/mL (ref 50.0–100.0)

## 2015-12-11 LAB — RPR: RPR Ser Ql: NONREACTIVE

## 2015-12-11 MED ORDER — LORAZEPAM 0.5 MG PO TABS: 0.5000 mg | ORAL_TABLET | Freq: Four times a day (QID) | ORAL | Status: DC | PRN

## 2015-12-11 MED ORDER — LORAZEPAM 1 MG PO TABS
1.0000 mg | ORAL_TABLET | Freq: Every day | ORAL | Status: DC
Start: 2015-12-11 — End: 2015-12-13
  Filled 2015-12-11 (×2): qty 1

## 2015-12-11 MED ORDER — LITHIUM CARBONATE ER 450 MG PO TBCR
450.0000 mg | EXTENDED_RELEASE_TABLET | Freq: Two times a day (BID) | ORAL | Status: DC
  Administered 2015-12-11 – 2015-12-19 (×18): 450 mg via ORAL
  Filled 2015-12-11 (×19): qty 1

## 2015-12-11 NOTE — BHH Group Notes (Signed)
[facility] Group Notes:  (Nursing/MHT/Case Management/Adjunct)  Date:  12/11/2015  Time:  10:40 AM  Type of Therapy:  Psychoeducational Skills  Participation Level:  Active  Participation Quality:  Appropriate  Affect:  Appropriate  Cognitive:  Appropriate  Insight:  Appropriate  Engagement in Group:  Engaged  Modes of Intervention:  Education  Summary of Progress/Problems:  Rinaldo Cloud 12/11/2015, 10:40 AM

## 2015-12-11 NOTE — BHH Group Notes (Signed)
[facility] LCSW Group Therapy  12/11/2015 3:55 PM  Type of Therapy:  Group Therapy  Participation Level:  Minimal  Participation Quality:  Attentive  Affect:  Depressed  Cognitive:  Alert  Insight:  Limited  Engagement in Therapy:  Limited  Modes of Intervention:  Discussion, Education, Socialization and Support  Summary of Progress/Problems: Pt will identify unhealthy thoughts and how they impact their emotions and behavior. Pt will be encouraged to discuss these thoughts, emotions and behaviors with the group. Pt attended group and stayed the entire time. She sat quietly and listened to other group members.   Bond MSW, [place]  12/11/2015, 3:55 PM

## 2015-12-11 NOTE — Progress Notes (Signed)
D:  Per pt self inventory pt reports sleeping fair, appetite fair, energy level normal, ability to pay attention good, rates depression at a 6 out of 10, hopelessness at a 6 out of 10, anxiety at a 6 out of 10, denies SI/HI/AVH, goal today: "Talking to my brother, talk with Ms. Mamie Nick about my thyroid"    A:  Emotional support provided, Encouraged pt to continue with treatment plan and attend all group activities, q15 min checks maintained for safety.  R:  Pt is receptive, going to groups, pleasant and cooperative with staff and other patients on the unit, pt remains safe.

## 2015-12-11 NOTE — BHH Group Notes (Signed)
[facility] Group Notes:  (Nursing/MHT/Case Management/Adjunct)  Date:  12/11/2015  Time:  10:47 PM  Type of Therapy:  Group Therapy  Participation Level:  Active  Participation Quality:  Appropriate  Affect:  Appropriate  Cognitive:  Appropriate  Insight:  Appropriate  Engagement in Group:  Engaged  Modes of Intervention:  Support  Summary of Progress/Problems:  Paula Dickson 12/11/2015, 10:47 PM

## 2015-12-11 NOTE — Plan of Care (Signed)
Problem: Alteration in thought process Goal: LTG-Patient has not harmed self or others in at least 2 days Outcome: Progressing Pt has not harmed self.

## 2015-12-11 NOTE — Progress Notes (Signed)
D: Pt approached Probation officer asking for medications . Patient alert and oriented x4. Patient denies SI/HI/AVH. Pt affect is anxious but appears brighter than last night. Pt stated "I learned a lot at groups" but could not go into detail. Pt denied feeling anxious, depressed, or paranoid. Pt denies having any current mental health needs stating "I'm pretty good with that." Pt indicates she is "ready to go and live life. A: Offered active listening and support. Provided therapeutic communication. Administered scheduled medications. Encouraged pt to continue to attend groups and actively participate in care. R: Pt pleasant and cooperative. Pt med compliant. Will continue Q15 min. Checks. Safety maintained.
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
## 2015-12-11 NOTE — Progress Notes (Signed)
Initial Nutrition Assessment     INTERVENTION:  Meals and snacks: Cater to pt preferences   NUTRITION DIAGNOSIS:   Unintentional weight loss related to chronic illness as evidenced by percent weight loss.    GOAL:   Patient will meet greater than or equal to 90% of their needs    MONITOR:    (Energy intake, Anthropometrics)  REASON FOR ASSESSMENT:   Malnutrition Screening Tool    ASSESSMENT:      Pt admitted with bipolar also found to have untreated hyperthyroidism  Past Medical History  Diagnosis Date  . History of arthritis   . History of chicken pox   . History of depression   . History of genital warts   . history of heart murmur   . History of high blood pressure   . History of thyroid disease   . History of UTI   . Bipolar affective disorder ([place])   . Hypertension   . Schizophrenia ([place])     Current Nutrition: eating 100% during admission and [facility] admission  Food/Nutrition-Related History: unable to obtain at this time   Scheduled Medications:  . amLODipine  5 mg Oral Daily  . lithium carbonate  450 mg Oral Q12H  . LORazepam  1 mg Oral QHS  . risperiDONE  2 mg Oral BID        Electrolyte/Renal Profile and Glucose Profile:   Recent Labs Lab 12/07/15 0500 12/10/15 1010  NA  --  140  K  --  4.4  CL  --  105  CO2  --  32  BUN  --  23*  CREATININE 0.93 0.90  CALCIUM  --  9.9  GLUCOSE  --  105*   Protein Profile:  Recent Labs Lab 12/10/15 1010  ALBUMIN 3.8    Gastrointestinal Profile: Last BM:1/11    Weight Change: 20% wt loss in the last 7 months (untreated hyperthyroidism)    Diet Order:  Diet regular Room service appropriate?: Yes; Fluid consistency:: Thin  Skin:   reviewed   Height:   Ht Readings from Last 1 Encounters:  12/10/15 5\' 1"  (1.549 m)    Weight:   Wt Readings from Last 1 Encounters:  12/10/15 126 lb (57.153 kg)    Ideal Body Weight:     BMI:  Body mass index is 23.82  kg/(m^2).  EDUCATION NEEDS:   No education needs identified at this time  LOW Care Level  Paula Dickson B. Zenia Resides, [place], [place] (pager) Weekend/On-Call pager [phone])

## 2015-12-12 LAB — BASIC METABOLIC PANEL
Anion gap: 6 (ref 5–15)
BUN: 20 mg/dL (ref 6–20)
CO2: 28 mmol/L (ref 22–32)
Calcium: 9.8 mg/dL (ref 8.9–10.3)
Chloride: 105 mmol/L (ref 101–111)
Creatinine, Ser: 0.9 mg/dL (ref 0.44–1.00)
GFR calc Af Amer: >60 mL/min (ref >60)
GFR calc non Af Amer: >60 mL/min (ref >60)
Glucose, Bld: 107 mg/dL — ABNORMAL HIGH (ref 65–99)
Potassium: 4.3 mmol/L (ref 3.5–5.1)
Sodium: 139 mmol/L (ref 135–145)

## 2015-12-12 NOTE — [facility] Group Notes (Signed)
[facility] LCSW Group Therapy  12/12/2015 5:41 PM  Type of Therapy:  Group Therapy  Participation Level:  Active  Participation Quality:  Appropriate  Affect:  Appropriate  Cognitive:  Alert  Insight:  Improving  Engagement in Therapy:  Improving  Modes of Intervention:  Discussion, Education, Socialization and Support  Summary of Progress/Problems: Todays topic: Grudges  Patients will be encouraged to discuss their thoughts, feelings, and behaviors as to why one holds on to grudges and reasons why people have grudges. Patients will process the impact of grudges on their daily lives and identify thoughts and feelings related to holding grudges. Patients will identify feelings and thoughts related to what life would look like without grudges.   Dacie Mandel L Alexandros Ewan MSW, LCSWA  12/12/2015, 5:41 PM   
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
## 2015-12-12 NOTE — Plan of Care (Signed)
Problem: Alteration in thought process Goal: STG-Patient is able to follow short directions Outcome: Progressing When asked to come to the med room for medication patient was able to follow directions.

## 2015-12-12 NOTE — Progress Notes (Signed)
D: Patient's speech is very pressured and rapid. When asked to explain why she was here she refused. She denies SI/HI/AVH at this time. She's visible in the milieu. She denies pain.  A: Medication given with education. Encouragement provided. R: Patient refused ativan but was compliant with other medication. Patient has been calm and cooperative. Safety maintained with 15 min checks.

## 2015-12-12 NOTE — Progress Notes (Signed)
Pt has been pleasant and cooperative. Pt denies SI and A/V hallucinations. Pt is able to contract for safety. Pt has been seclusive to her room. most of the day. Pt talked about the events leading to hospitalization. No inappropriate  behaviors noted this period.

## 2015-12-13 LAB — THYROID PANEL WITH TSH
Free Thyroxine Index: 2.1 (ref 1.2–4.9)
T3 Uptake Ratio: 26 % (ref 24–39)
T4, Total: 8 ug/dL (ref 4.5–12.0)
TSH: 1.12 u[IU]/mL (ref 0.450–4.500)

## 2015-12-13 MED ORDER — PROPRANOLOL HCL 10 MG PO TABS
10.0000 mg | ORAL_TABLET | Freq: Three times a day (TID) | ORAL | Status: DC
  Administered 2015-12-13 – 2015-12-16 (×9): 10 mg via ORAL
  Filled 2015-12-13 (×10): qty 1

## 2015-12-13 MED ORDER — QUETIAPINE FUMARATE ER 50 MG PO TB24
50.0000 mg | ORAL_TABLET | Freq: Every day | ORAL | Status: DC
  Administered 2015-12-13: 50 mg via ORAL
  Filled 2015-12-13 (×2): qty 1

## 2015-12-13 NOTE — Progress Notes (Signed)
Recreation Therapy Notes  Date: 01.16.17 Time: 3:00 pm Location: Craft Room  Group Topic: Wellness  Goal Area(s) Addresses:  Patient will identify at least one item per dimension of health. Patient will examine areas they are deficient in.  Behavioral Response: Attentive, Interactive   Intervention: 6 Dimensions of Health  Activity: Patients were given a worksheet with the definitions of the 6 dimensions of health. Patients were given a worksheet with the 6 dimensions of health listed and were encouraged to write 2-3 things they are currently doing in each category.  Education: LRT educated patients on how this activity is related to their admission and d/c.  Education Outcome: Acknowledges education/In group clarification offered   Clinical Observations/Feedback: Patient completed activity by writing at least 2 items in each category. Patient contributed to group discussion by stating what categories she was giving enough attention to, and what categories she was not giving enough attention to.  Leonette [name], LRT/CTRS 12/13/2015 4:31 PM

## 2015-12-13 NOTE — B[facility] Group Notes (Signed)
[place] Group Notes:  (Nursing/MHT/Case Management/Adjunct)  Date:  12/13/2015  Time:  12:58 PM  Type of Therapy:  Group Therapy  Participation Level:  Active  Participation Quality:  Appropriate  Affect:  Appropriate  Cognitive:  Appropriate  Insight:  Good  Engagement in Group:  Engaged  Modes of Intervention:  Support  Summary of Progress/Problems:  Paula Dickson 12/13/2015, 12:58 PM

## 2015-12-13 NOTE — Progress Notes (Signed)
[facility] MD Progress Note  12/13/2015 11:35 AM Paula Dickson  MRN:  [id number]  Subjective:  Paula Dickson is rather confused today and very suspicious. She believes that she will be going to jail from the hospital. She cannot explain why. She comes back to me later to tell me that in fact it would be her father will go to jail. 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 She has been taking medications as prescribed and Depakote was switched to lithium over the weekend due to high ammonia. The patient has been on the lithium for years doing well. She is extremely shaky. We initially thought that this could be from hyperthyroidism, Depakote, or Risperdal. The patient in the past was treated with propranolol for shakes. She denies history of familial tremor.   Principal Problem: Bipolar I disorder, most recent episode manic, severe with psychotic features ([place]) Diagnosis:   Patient Active Problem List   Diagnosis Date Noted  . Hyperthyroidism [E05.90] 12/03/2015  . Acute encephalopathy [G93.40] 12/01/2015  . 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 Hyponatremia [E87.1] 11/30/2015  . Acute renal failure syndrome ([place]) [N17.9]   . Bipolar I disorder, most recent episode manic, severe with psychotic features ([place]) [F31.2] 06/14/2015  . Hypokalemia [E87.6] 06/13/2015  . HTN (hypertension) [I10] 04/02/2014   Total Time spent with patient: 20 minutes  Past Psychiatric History: Bipolar disorder.  Past Medical History:  Past Medical History  Diagnosis Date  . History of arthritis   . History of chicken pox   . History of depression   . History of genital warts   . history of heart murmur   . History of high blood pressure   . History of thyroid disease   . History of UTI   . Bipolar affective disorder ([place])   . Hypertension   . Schizophrenia [facility])      Past Surgical History  Procedure Laterality Date  . Cyst removal neck      around 11 years ago /benign  . Multiple tooth extractions    . Ablation on endometriosis     Family History:  Family History  Problem Relation Age of Onset  . Alcohol abuse Paternal Uncle   . Alcohol abuse Paternal Grandfather   . Arthritis Father   . Hyperlipidemia Father   . High blood pressure Father   . Breast cancer Maternal Aunt   . Breast cancer Paternal Aunt   . High blood pressure Sister   . Diabetes Sister   . Diabetes Mother   . Mental illness Other     runs in family  . Diabetes Brother   . Mental illness Brother    Family Psychiatric  History: None reported. Social History:  History  Alcohol Use No     History  Drug Use No    Social History   Social History  . Marital Status: Single    Spouse Name: N/A  . Number of Children: N/A  . Years of Education: N/A   Social History Main Topics  . Smoking status: Never Smoker   . Smokeless tobacco: Never Used  . Alcohol Use: No  . Drug Use: No  . Sexual Activity: Not Currently   Other Topics Concern  . None   Social History Narrative   Additional Social History:  Sleep: Fair  Appetite:  Fair  Current Medications: Current Facility-Administered Medications  Medication Dose Route Frequency Provider Last Rate Last Dose  . alum & mag hydroxide-simeth (MAALOX/MYLANTA) 200-200-20 MG/5ML suspension 30 mL  30 mL Oral Q4H PRN Maisee Vollman B Jamariya Davidoff, MD      . amLODipine (NORVASC) tablet 5 mg  5 mg Oral Daily Ashira Kirsten B Sayid Moll, MD   5 mg at 12/13/15 0919  . lithium carbonate (ESKALITH) CR tablet 450 mg  450 mg Oral Q12H Hildred Priest, MD   450 mg at 12/13/15 0919  . magnesium hydroxide (MILK OF MAGNESIA) suspension 30 mL  30 mL Oral Daily PRN Keyvon Herter B Laya Letendre, MD      . propranolol (INDERAL) tablet 10 mg  10 mg Oral TID Clovis Fredrickson, MD   10 mg at 12/13/15 1105  . QUEtiapine  (SEROQUEL XR) 24 hr tablet 50 mg  50 mg Oral QHS Clovis Fredrickson, MD        Lab Results:  Results for orders placed or performed during the hospital encounter of 12/09/15 (from the past 48 hour(s))  Basic metabolic panel     Status: Abnormal   Collection Time: 12/12/15  4:05 AM  Result Value Ref Range   Sodium 139 135 - 145 mmol/L   Potassium 4.3 3.5 - 5.1 mmol/L   Chloride 105 101 - 111 mmol/L   CO2 28 22 - 32 mmol/L   Glucose, Bld 107 (H) 65 - 99 mg/dL   BUN 20 6 - 20 mg/dL   Creatinine, Ser 0.90 0.44 - 1.00 mg/dL   Calcium 9.8 8.9 - 10.3 mg/dL   GFR calc non Af Amer >60 >60 mL/min   GFR calc Af Amer >60 >60 mL/min    Comment: (NOTE) The eGFR has been calculated using the CKD EPI equation. This calculation has not been validated in all clinical situations. eGFR's persistently <60 mL/min signify possible Chronic Kidney Disease.    Anion gap 6 5 - 15  Thyroid Panel With TSH     Status: None   Collection Time: 12/12/15  4:05 AM  Result Value Ref Range   TSH 1.120 0.450 - 4.500 uIU/mL   T4, Total 8.0 4.5 - 12.0 ug/dL   T3 Uptake Ratio 26 24 - 39 %   Free Thyroxine Index 2.1 1.2 - 4.9    Comment: (NOTE) Performed At: [facility] [place], [place] [id number] Lindon Romp MD [id number]     Physical Findings: AIMS: Facial and Oral Movements Muscles of Facial Expression: None, normal Lips and Perioral Area: None, normal Jaw: None, normal Tongue: None, normal,Extremity Movements Upper (arms, wrists, hands, fingers): None, normal Lower (legs, knees, ankles, toes): None, normal, Trunk Movements Neck, shoulders, hips: None, normal, Overall Severity Severity of abnormal movements (highest score from questions above): None, normal Incapacitation due to abnormal movements: None, normal Patient's awareness of abnormal movements (rate only patient's report): No Awareness, Dental Status Current problems with teeth and/or dentures?: No Does patient  usually wear dentures?: No  CIWA:  CIWA-Ar Total: 0 COWS:     Musculoskeletal: Strength & Muscle Tone: within normal limits Gait & Station: normal Patient leans: N/A  Psychiatric Specialty Exam: Review of Systems  Psychiatric/Behavioral: The patient is nervous/anxious.   All other systems reviewed and are negative.   Blood pressure 143/95, pulse 112, temperature 98.3 F (36.8 C), temperature source Oral, resp. rate 20, height 5' 1"  (1.549 m), weight 57.153 kg (126 lb).Body mass index is 23.82  kg/(m^2).  General Appearance: Fairly Groomed  Engineer, water::  Fair  Speech:  Clear and Coherent  Volume:  Normal  Mood:  Dysphoric  Affect:  Congruent  Thought Process:  Disorganized  Orientation:  Full (Time, Place, and Person)  Thought Content:  Delusions and Paranoid Ideation  Suicidal Thoughts:  No  Homicidal Thoughts:  No  Memory:  Immediate;   Fair Recent;   Fair Remote;   Fair  Judgement:  Poor  Insight:  Shallow  Psychomotor Activity:  Normal  Concentration:  Fair  Recall:  [place]: Fair  Akathisia:  No  Handed:  Right  AIMS (if indicated):     Assets:  Communication Skills Desire for Improvement Housing Resilience Social Support  ADL's:  Intact  Cognition: WNL  Sleep:  Number of Hours: 6.45   Treatment Plan Summary: Daily contact with patient to assess and evaluate symptoms and progress in treatment and Medication management   Ms. Goettel is a 50 year old female with a history of bipolar illness transferred from medical floor of [facility] where she was hospitalized for polydipsia and low sodium to treat psychotic disorganization and aggressive behavior in the context of treatment noncompliance.  1. Agitated behavior. This has resolved.  2. Mood. She was started on Depakote and low-dose Seroquel at [place],. Her Depakote is elevated at 125. We are holding Depakote and check ammonia. The patient did exceptionally well on the lithium for  many years. This was recently discontinued for unknown reasons. She was restarted on lithium on January 15. She did well on Risperdal in the past but it's possible that now it causes tremors. We'll switch her back to Seroquel.  3. Hypertension. She is on Norvasc. .  4. Hyperthyroidism. We will ask endocrinology consult. The patient has tremor. It could be from hyperactive thyroid, lithium or Depakote, on EPS from antipsychotics. TSH was low initially but now is normal along with thyroid panel.   5. Hyponatremia. This has resolved.  6. Insomnia. She slept 7 hours with trazodone.  7. STD. HIV, RPR and hepatitis panel were all negative  8.  Metabolic syndrome. Lipid panel, hemoglobin A1c are not elevated.   9. Disposition. She will be discharged to home with her father. She will follow up with her regular psychiatrist in [place].    Solana Coggin 12/13/2015, 11:35 AM

## 2015-12-13 NOTE — Progress Notes (Addendum)
D: Pt denies SI/AVH. Pt stated that she no longer felt unsafe in the milieu when writer inquired. Pleasant and cooperative but suspicious. Pt only complaint were tremors in bilateral upper extremities. This a.m. Pt came by Nurse station and stated "That medicine you gave me last night must have helped because my tremors aren't as bad this a.m." A: Medication given with education. Encouraged to continue following treatment plan. Q15 minute checks maintained for safety. R: Patient refused ativan but was compliant with other medication after being educated on what the medications were for. Patient has been calm and cooperative; guarded. Will continue to monitor.

## 2015-12-13 NOTE — Progress Notes (Signed)
D:  Per pt self inventory pt reports sleeping good, appetite good, energy level normal, ability to pay attention good, rates depression at a 2 out of 10, hopelessness at a 2 out of 10, anxiety at a 2 out of 10, denies SI/HI/AVH, goal today: "getting it right, ask questions", pt is disorganized, anxious, suspicious of medication administration, tremulous, MD aware.    A:  Emotional support provided, Encouraged pt to continue with treatment plan and attend all group activities, q15 min checks maintained for safety.  R:  Pt is needy/defensive, going to groups, cooperative with staff and other patients on the unit.

## 2015-12-14 ENCOUNTER — Telehealth: Payer: Self-pay

## 2015-12-14 MED ORDER — QUETIAPINE FUMARATE ER 50 MG PO TB24
200.0000 mg | ORAL_TABLET | Freq: Every day | ORAL | Status: DC
  Administered 2015-12-14: 200 mg via ORAL
  Filled 2015-12-14: qty 4

## 2015-12-14 NOTE — Progress Notes (Signed)
Recreation Therapy Notes  Date: 01.17.17 Time: 3:00 pm Location: Craft Room  Group Topic: Self-expression  Goal Area(s) Addresses:  Patient will be able to identify a color that represents each emotion. Patient will verbalize benefit of using art as a means of self-expression. Patient will verbalize one positive emotion experienced while participating in the activity.  Behavioral Response: Attentive, Interactive   Intervention: The Colors Within Me  Activity: Patients were given a blank face worksheet and instructed to analyze the emotions they were experiencing, pick a color for each emotion, and show on the face how much of that emotion they were experiencing.  Education: LRT educated patients on different forms of self-expression.  Education Outcome: In group clarification offered   Clinical Observations/Feedback: Patient completed activity by picking a color for each emotion and showing how much of that emotion she was experiencing on the worksheet. Patient contributed to group discussion by stating what emotions she experienced during group.  Leonette [name], LRT/CTRS 12/14/2015 4:27 PM

## 2015-12-14 NOTE — BHH Group Notes (Signed)
[facility] Group Notes:  (Nursing/MHT/Case Management/Adjunct)  Date:  12/14/2015  Time:  1:03 AM  Type of Therapy:  Group Therapy  Participation Level:  Active  Participation Quality:  Appropriate  Affect:  Appropriate  Cognitive:  Appropriate  Insight:  Appropriate  Engagement in Group:  Engaged  Modes of Intervention:  n/a  Summary of Progress/Problems:  Paula Dickson 12/14/2015, 1:03 AM

## 2015-12-14 NOTE — Progress Notes (Signed)
[facility] MD Progress Note  12/14/2015 1:59 PM Paula Dickson  MRN:  [id number]  Subjective:  Paula Dickson is still very disorganized and extremely paranoid. She again has been calling [place] and [place] compulsively. She takes medications with much encouragement and demands the lowest dose. Her temper is slightly better with addition of propranolol and switch to Seroquel from Risperdal. There are no somatic symptoms. She participates in programming.  Principal Problem: Bipolar I disorder, most recent episode manic, severe with psychotic features ([place]) Diagnosis:   Patient Active Problem List   Diagnosis Date Noted  . Hyperthyroidism [E05.90] 12/03/2015  . Acute encephalopathy [G93.40] 12/01/2015  . 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 Hyponatremia [E87.1] 11/30/2015  . Acute renal failure syndrome ([place]) [N17.9]   . Bipolar I disorder, most recent episode manic, severe with psychotic features ([place]) [F31.2] 06/14/2015  . Hypokalemia [E87.6] 06/13/2015  . HTN (hypertension) [I10] 04/02/2014   Total Time spent with patient: 20 minutes  Past Psychiatric History: Schizoaffective disorder.  Past Medical History:  Past Medical History  Diagnosis Date  . History of arthritis   . History of chicken pox   . History of depression   . History of genital warts   . history of heart murmur   . History of high blood pressure   . History of thyroid disease   . History of UTI   . Bipolar affective disorder ([place])   . Hypertension   . Schizophrenia [facility])     Past Surgical History  Procedure Laterality Date  . Cyst removal neck      around 11 years ago /benign  . Multiple tooth extractions    . Ablation on endometriosis     Family History:  Family History  Problem Relation Age of Onset  . Alcohol abuse Paternal Uncle   . Alcohol abuse Paternal Grandfather   . Arthritis Father   . Hyperlipidemia Father   . High blood pressure Father   . Breast  cancer Maternal Aunt   . Breast cancer Paternal Aunt   . High blood pressure Sister   . Diabetes Sister   . Diabetes Mother   . Mental illness Other     runs in family  . Diabetes Brother   . Mental illness Brother    Family Psychiatric  History: Brother with mental illness and suicide attempts. Social History:  History  Alcohol Use No     History  Drug Use No    Social History   Social History  . Marital Status: Single    Spouse Name: N/A  . Number of Children: N/A  . Years of Education: N/A   Social History Main Topics  . Smoking status: Never Smoker   . Smokeless tobacco: Never Used  . Alcohol Use: No  . Drug Use: No  . Sexual Activity: Not Currently   Other Topics Concern  . None   Social History Narrative   Additional Social History:                         Sleep: Fair  Appetite:  Fair  Current Medications: Current Facility-Administered Medications  Medication Dose Route Frequency Provider Last Rate Last Dose  . alum & mag hydroxide-simeth (MAALOX/MYLANTA) 200-200-20 MG/5ML suspension 30 mL  30 mL Oral Q4H PRN Braylen Staller B Mckynzi Cammon, MD      . amLODipine (NORVASC) tablet 5 mg  5 mg Oral Daily Clovis Fredrickson, MD   5  mg at 12/14/15 0843  . lithium carbonate (ESKALITH) CR tablet 450 mg  450 mg Oral Q12H Hildred Priest, MD   450 mg at 12/14/15 0843  . magnesium hydroxide (MILK OF MAGNESIA) suspension 30 mL  30 mL Oral Daily PRN Lindell Tussey B Akshar Starnes, MD      . propranolol (INDERAL) tablet 10 mg  10 mg Oral TID Clovis Fredrickson, MD   10 mg at 12/14/15 0842  . QUEtiapine (SEROQUEL XR) 24 hr tablet 50 mg  50 mg Oral QHS Clovis Fredrickson, MD   50 mg at 12/13/15 2202    Lab Results: No results found for this or any previous visit (from the past 26 hour(s)).  Physical Findings: AIMS: Facial and Oral Movements Muscles of Facial Expression: None, normal Lips and Perioral Area: None, normal Jaw: None, normal Tongue: None,  normal,Extremity Movements Upper (arms, wrists, hands, fingers): None, normal Lower (legs, knees, ankles, toes): None, normal, Trunk Movements Neck, shoulders, hips: None, normal, Overall Severity Severity of abnormal movements (highest score from questions above): None, normal Incapacitation due to abnormal movements: None, normal Patient's awareness of abnormal movements (rate only patient's report): No Awareness, Dental Status Current problems with teeth and/or dentures?: No Does patient usually wear dentures?: No  CIWA:  CIWA-Ar Total: 0 COWS:     Musculoskeletal: Strength & Muscle Tone: within normal limits Gait & Station: normal Patient leans: N/A  Psychiatric Specialty Exam: Review of Systems  Neurological: Positive for tremors.  All other systems reviewed and are negative.   Blood pressure 132/96, pulse 109, temperature 98.3 F (36.8 C), temperature source Oral, resp. rate 20, height 5\' 1"  (1.549 m), weight 57.153 kg (126 lb).Body mass index is 23.82 kg/(m^2).  General Appearance: Casual  Eye Contact::  Good  Speech:  Clear and Coherent  Volume:  Normal  Mood:  Anxious  Affect:  Congruent  Thought Process:  Disorganized  Orientation:  Full (Time, Place, and Person)  Thought Content:  Delusions and Paranoid Ideation  Suicidal Thoughts:  No  Homicidal Thoughts:  No  Memory:  Immediate;   Fair Recent;   Fair Remote;   Fair  Judgement:  Poor  Insight:  Lacking  Psychomotor Activity:  Increased  Concentration:  Fair  Recall:  [employer] of Knowledge:Fair  Language: Fair  Akathisia:  No  Handed:  Right  AIMS (if indicated):     Assets:  Communication Skills Desire for Improvement Housing Physical Health Resilience Social Support  ADL's:  Intact  Cognition: WNL  Sleep:  Number of Hours: 5   Treatment Plan Summary: Daily contact with patient to assess and evaluate symptoms and progress in treatment and Medication management   Paula Dickson is a 50 year old  female with a history of bipolar illness transferred from medical floor of [facility] where she was hospitalized for polydipsia and low sodium to treat psychotic disorganization and aggressive behavior in the context of treatment noncompliance.  1. Agitated behavior. This has resolved.  2. Mood. She was started on Depakote and low-dose Seroquel at [place],. Her Depakote is elevated at 125. We are holding Depakote and check ammonia. The patient did exceptionally well on the lithium for many years. This was recently discontinued for unknown reasons. She was restarted on lithium on January 15. She did well on Risperdal in the past but it's possible that now it causes tremors. We'll increase Seroquel XR to 150 mg. Lithium level in am.  3. Hypertension. She is on Norvasc. .  4. Hyperthyroidism.  We will ask endocrinology consult. The patient has tremor. It could be from hyperactive thyroid, lithium or Depakote, on EPS from antipsychotics. TSH was low initially but now is normal along with thyroid panel.   5. Hyponatremia. This has resolved.  6. Insomnia. She slept 5 hours with trazodone.  7. STD. HIV, RPR and hepatitis panel were all negative  8. Metabolic syndrome. Lipid panel, hemoglobin A1c are not elevated.   9. Disposition. She will be discharged to home with her father. She will follow up with her regular psychiatrist in [place].  Francois Elk 12/14/2015, 1:59 PM

## 2015-12-14 NOTE — Plan of Care (Signed)
Problem: Alteration in thought process Goal: LTG-Patient verbalizes understanding importance med regimen (Patient verbalizes understanding of importance of medication regimen and need to continue outpatient care.)  Outcome: Progressing Pt verbalizes understanding of medications and asks questions about what she is taking, the dosage, and what the medication is for.

## 2015-12-14 NOTE — BHH Group Notes (Signed)
[facility] Group Notes:  (Nursing/MHT/Case Management/Adjunct)  Date:  12/14/2015  Time:  9:57 PM  Type of Therapy:  Group Therapy  Participation Level:  Active  Participation Quality:  Appropriate  Affect:  Appropriate  Cognitive:  Appropriate  Insight:  Appropriate  Engagement in Group:  Engaged  Modes of Intervention:  Education  Summary of Progress/Problems:  Kandis Fantasia 12/14/2015, 9:57 PM

## 2015-12-14 NOTE — Progress Notes (Signed)
Dr. Mamie Nick - print HIV results for patient.

## 2015-12-14 NOTE — Progress Notes (Addendum)
Patient visible on the milieu. Patient medication and group compliant. 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 Patient is knowledgeable about psych medication.Patient inquiring about getting disability forms complete. Patient slept good last night.Did not need sleep medication. Patient has a high energy level. Patient rates depression and hopelessness on level 8. Patient denies substance abuse but is experiencing tremors.No complaints of pain. Goal for today is doing the right thing. Patient is rambling, disorganized, and cooperative with care.  Will continue to monitor.

## 2015-12-14 NOTE — BHH Group Notes (Signed)
[facility] Group Notes:  (Nursing/MHT/Case Management/Adjunct)  Date:  12/14/2015  Time:  2:02 PM  Type of Therapy:  Psychoeducational Skills  Participation Level:  Active  Participation Quality:  Appropriate  Affect:  Appropriate  Cognitive:  Appropriate  Insight:  Appropriate  Engagement in Group:  Engaged  Modes of Intervention:  Discussion and Education  Summary of Progress/Problems:  Drake Leach 12/14/2015, 2:02 PM

## 2015-12-14 NOTE — Telephone Encounter (Signed)
Pt left v/m that she is presently at [facility] in hospital; difficult to understand message; tried to call [phone] and no answer and no v/m.

## 2015-12-14 NOTE — Tx Team (Signed)
Interdisciplinary Treatment Plan Update (Adult)  Date:  12/14/2015 Time Reviewed:  3:46 PM  Progress in Treatment: Attending groups: Yes. Participating in groups:  Yes. Taking medication as prescribed:  Yes. Tolerating medication:  Yes. Family/Significant othe contact made:  No, will contact:  if patient provides consent Patient understands diagnosis:  No. and As evidenced by:  patient unable to identify her symptoms for hospitalization Discussing patient identified problems/goals with staff:  Yes. Medical problems stabilized or resolved:  Yes. Denies suicidal/homicidal ideation: Yes. Issues/concerns per patient self-inventory:  No. Other:  New problem(s) identified: No, Describe:  none reported  Discharge Plan or Barriers:Patient will stabilize on medication and discharge home will outpatient follow up in place.  Reason for Continuation of Hospitalization: Aggression Mania Medication stabilization Other; describe paranoia  Comments:  Estimated length of stay: up to 6 days expected discharge Monday 12/20/15  New goal(s):  Review of initial/current patient goals per problem list:   1.  Goal(s):participate in aftercare planning  Met:  No  Target date:by discharge  As evidenced HQ:NETUYWS participation in aftercare planning  2.  Goal (s):decrease psychosis  Met:  No  Target date:by discharge  As evidenced [id number] demonstrates decreased symptoms of psychosis  3.  Goal(s):decrease agression  Met:  No  Target date:by discharge  As evidenced OH:COBTVMT demonstration no symptoms of aggression   Attendees: Physician:  [name], MD 1/17/20173:46 PM  Nursing:   Drema Dallas, RN 1/17/20173:46 PM  Other:  Carmell Austria, Ko Olina 1/17/20173:46 PM  Other:   1/17/20173:46 PM  Other:   1/17/20173:46 PM  Other:  1/17/20173:46 PM  Other:  1/17/20173:46 PM  Other:  1/17/20173:46 PM  Other:  1/17/20173:46 PM  Other:  1/17/20173:46 PM  Other:  1/17/20173:46 PM   Other:   1/17/20173:46 PM   Scribe for Treatment Team:   Keene Breath, MSW, LCSWA [phone]  12/14/2015, 3:46 PM

## 2015-12-14 NOTE — Progress Notes (Signed)
D: Pt continues to be disorganized and anxious this evening. Pt reports that she signed a 72-hour request for discharge earlier in the day. Writer asked pt if she understood what this meant and if she had any questions. Pt remains fixed on the 72-hour and repeatedly mentions that she has "legal problems but I don't know what they are yet". Denies SI/HI/AVH at this time. Denies pain. A: Emotional support and encouragement provided. Pt re-educated on 72-hour request. Medications given with education. q15 minute safety checks maintained. R: Pt verbalizes understanding. Pt remains free from harm.

## 2015-12-15 LAB — LITHIUM LEVEL: Lithium Lvl: 0.99 mmol/L (ref 0.60–1.20)

## 2015-12-15 MED ORDER — QUETIAPINE FUMARATE ER 50 MG PO TB24
300.0000 mg | ORAL_TABLET | Freq: Every day | ORAL | Status: DC
  Administered 2015-12-15: 300 mg via ORAL
  Filled 2015-12-15: qty 6

## 2015-12-15 NOTE — BHH Group Notes (Signed)
[facility] Group Notes:  (Nursing/MHT/Case Management/Adjunct)  Date:  12/15/2015  Time:  12:23 PM  Type of Therapy:  Psychoeducational Skills  Participation Level:  Did Not Attend   Adela Lank [facility] 12/15/2015, 12:23 PM

## 2015-12-15 NOTE — Plan of Care (Signed)
Problem: Ineffective individual coping Goal: STG-Increase in ability to manage activities of daily living Outcome: Progressing Patient was able to get her clothes together to wash and dry them.

## 2015-12-15 NOTE — Progress Notes (Signed)
D-Patient is hyperverbal and ruminating with her thoughts.  She needs frequent redirection from approaching the nurses desk. Paula Dickson is focused on HIPPA paperwork today.She rates her depression at 8 and anxiety at 10.  Denies S/I or physical pain. A- Support and redirection given. Continue current POC and evaluation of treatment goals.  Continue 15' checks for safety.  R- Safety maintained.

## 2015-12-15 NOTE — BHH Group Notes (Signed)
[facility] LCSW Group Therapy  12/15/2015 4:10 PM  Type of Therapy:  Group Therapy  Participation Level:  Active  Participation Quality:  Attentive, Monopolizing and Sharing  Affect:  Anxious, Labile and Tearful  Cognitive:  Disorganized  Insight:  Lacking  Engagement in Therapy:  Engaged, Off topic, requiring redirection  Modes of Intervention:  Discussion, Exploration, Problem-solving and Socialization  Summary of Progress/Problems:Pt attended and participated in group discussion of emotions and emotion regulation.  Group identified skills that can be used to lessen "trigger" emotions to allow time to make less impulsive decisions and to get [place] "unstuck". Pt had difficulty following with group at times.  Tangential and tearful at times.  Shares irrational fears like "violating HIPPA" and things like this.  With reassurance and a short time to vent concerns after group, Pt calm and went about unit with peers.  August Saucer, MSW, LCSW 12/15/2015, 4:10 PM

## 2015-12-15 NOTE — BHH Group Notes (Signed)
[facility] Group Notes:  (Nursing/MHT/Case Management/Adjunct)  Date:  12/15/2015  Time:  11:57 PM  Type of Therapy:  Evening Wrap-up Group  Participation Level:  Active  Participation Quality: Intrusive  Affect:  Anxious  Cognitive:  Disorganized  Insight:  Lacking  Engagement in Group:  Monopolizing  Modes of Intervention:  Discussion  Summary of Progress/Problems:  Paula Dickson 12/15/2015, 11:57 PM

## 2015-12-15 NOTE — Progress Notes (Signed)
Recreation Therapy Notes  Date: 01.18.17 Time: 3:00 pm Location: Craft Room  Group Topic: Self-esteem  Goal Area(s) Addresses:  Patient will write at least one positive trait. Patient will verbalize benefit of having a healthy self-esteem.  Behavioral Response: Attentive, Interactive, Disruptive  Intervention: I Am  Activity: Patients were given a worksheet with the letter I on it and instructed to write as many positive traits inside the letter.  Education: LRT educated patients on ways they can increase their self-esteem.  Education Outcome: In group clarification offered.   Clinical Observations/Feedback: Patient completed activity by writing positive traits. Patient became tearful during the activity talking about her dad and sister. Patient contributed to group discussion by stating that it was difficult to think of positive traits. Patient would get off topic and start talking about church or her clothes. LRT had to redirect patient multiple times.   Leonette [name], LRT/CTRS 12/15/2015 4:52 PM

## 2015-12-15 NOTE — Progress Notes (Signed)
D: Patient has been very suspicious today. When offering her medication she would only take 50mg  of her serequel and not 200mg . She took one of the pills and gave the rest back to me. She began to talk about how she does not have any representation in here and does not have a case worker. She's very disorganized and paranoid. She's been constantly pacing and fixated on leaving tomorrow. She stated even if the doctor gives her 200mg  she can still just take the 50mg .  A: Medication was given with education. Encouragement was provided. Redirection was needed.  R: Patient was only compliant with some medication. Safety maintained with 15 min checks/

## 2015-12-15 NOTE — Progress Notes (Signed)
[facility] MD Progress Note  12/15/2015 12:11 PM Paula Dickson  MRN:  [id number]  Subjective:  Paula Dickson is still very disorganized, intrusive, and paranoid. She constantly worries about several legal actions against her or her father. She believes that money was stolen from her church and [employer] where she worked for 9 years. She's been calling school systems in [place] and [place] constantly where she is still employed. She complains that her Seroquel dose is too high and agrees to take 50 mg. She was explained that it will help her get out of the hospital. Her lithium level is 0.99 today. She is continuously asking about discharge. There are no somatic complaints. Her tremor is slightly better. Sleep and appetite are fair.  Principal Problem: Bipolar I disorder, most recent episode manic, severe with psychotic features ([place]) Diagnosis:   Patient Active Problem List   Diagnosis Date Noted  . Hyperthyroidism [E05.90] 12/03/2015  . Acute encephalopathy [G93.40] 12/01/2015  . 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 Hyponatremia [E87.1] 11/30/2015  . Acute renal failure syndrome ([place]) [N17.9]   . Bipolar I disorder, most recent episode manic, severe with psychotic features ([place]) [F31.2] 06/14/2015  . Hypokalemia [E87.6] 06/13/2015  . HTN (hypertension) [I10] 04/02/2014   Total Time spent with patient: 20 minutes  Past Psychiatric History: Bipolar disorder.  Past Medical History:  Past Medical History  Diagnosis Date  . History of arthritis   . History of chicken pox   . History of depression   . History of genital warts   . history of heart murmur   . History of high blood pressure   . History of thyroid disease   . History of UTI   . Bipolar affective disorder ([place])   . Hypertension   . Schizophrenia [facility])     Past Surgical History  Procedure Laterality Date  . Cyst removal neck      around 11 years ago /benign  . Multiple tooth extractions    . Ablation on  endometriosis     Family History:  Family History  Problem Relation Age of Onset  . Alcohol abuse Paternal Uncle   . Alcohol abuse Paternal Grandfather   . Arthritis Father   . Hyperlipidemia Father   . High blood pressure Father   . Breast cancer Maternal Aunt   . Breast cancer Paternal Aunt   . High blood pressure Sister   . Diabetes Sister   . Diabetes Mother   . Mental illness Other     runs in family  . Diabetes Brother   . Mental illness Brother    Family Psychiatric  History: Father with severe depression and suicide attempts. Social History:  History  Alcohol Use No     History  Drug Use No    Social History   Social History  . Marital Status: Single    Spouse Name: N/A  . Number of Children: N/A  . Years of Education: N/A   Social History Main Topics  . Smoking status: Never Smoker   . Smokeless tobacco: Never Used  . Alcohol Use: No  . Drug Use: No  . Sexual Activity: Not Currently   Other Topics Concern  . None   Social History Narrative   Additional Social History:                         Sleep: Fair  Appetite:  Fair  Current Medications: Current Facility-Administered Medications  Medication  Dose Route Frequency Provider Last Rate Last Dose  . alum & mag hydroxide-simeth (MAALOX/MYLANTA) 200-200-20 MG/5ML suspension 30 mL  30 mL Oral Q4H PRN Kiana Hollar B Anik Wesch, MD      . amLODipine (NORVASC) tablet 5 mg  5 mg Oral Daily Lorelee Mclaurin B Shrey Boike, MD   5 mg at 12/15/15 0830  . lithium carbonate (ESKALITH) CR tablet 450 mg  450 mg Oral Q12H Hildred Priest, MD   450 mg at 12/15/15 0831  . magnesium hydroxide (MILK OF MAGNESIA) suspension 30 mL  30 mL Oral Daily PRN Jearline Hirschhorn B Martavis Gurney, MD      . propranolol (INDERAL) tablet 10 mg  10 mg Oral TID Clovis Fredrickson, MD   10 mg at 12/15/15 0832  . QUEtiapine (SEROQUEL XR) 24 hr tablet 200 mg  200 mg Oral QHS Clovis Fredrickson, MD   200 mg at 12/14/15 2129    Lab Results:   Results for orders placed or performed during the hospital encounter of 12/09/15 (from the past 48 hour(s))  Lithium level     Status: None   Collection Time: 12/15/15  8:10 AM  Result Value Ref Range   Lithium Lvl 0.99 0.60 - 1.20 mmol/L    Physical Findings: AIMS: Facial and Oral Movements Muscles of Facial Expression: None, normal Lips and Perioral Area: None, normal Jaw: None, normal Tongue: None, normal,Extremity Movements Upper (arms, wrists, hands, fingers): None, normal Lower (legs, knees, ankles, toes): None, normal, Trunk Movements Neck, shoulders, hips: None, normal, Overall Severity Severity of abnormal movements (highest score from questions above): None, normal Incapacitation due to abnormal movements: None, normal Patient's awareness of abnormal movements (rate only patient's report): No Awareness, Dental Status Current problems with teeth and/or dentures?: No Does patient usually wear dentures?: No  CIWA:  CIWA-Ar Total: 0 COWS:     Musculoskeletal: Strength & Muscle Tone: within normal limits Gait & Station: normal Patient leans: N/A  Psychiatric Specialty Exam: Review of Systems  All other systems reviewed and are negative.   Blood pressure 144/91, pulse 83, temperature 98.3 F (36.8 C), temperature source Oral, resp. rate 20, height 5\' 1"  (1.549 m), weight 57.153 kg (126 lb).Body mass index is 23.82 kg/(m^2).  General Appearance: Fairly Groomed  Engineer, water::  Good  Speech:  Pressured  Volume:  Increased  Mood:  Angry, Dysphoric and Irritable  Affect:  Labile  Thought Process:  Disorganized  Orientation:  Full (Time, Place, and Person)  Thought Content:  Delusions and Paranoid Ideation  Suicidal Thoughts:  No  Homicidal Thoughts:  No  Memory:  Immediate;   Fair Recent;   Fair Remote;   Fair  Judgement:  Poor  Insight:  Lacking  Psychomotor Activity:  Increased  Concentration:  Poor  Recall:  Poor  Fund of Knowledge:Poor  Language: Poor   Akathisia:  No  Handed:  Right  AIMS (if indicated):     Assets:  Communication Skills Desire for Improvement Financial Resources/Insurance Housing Physical Health Resilience Social Support  ADL's:  Intact  Cognition: WNL  Sleep:  Number of Hours: 4.5   Treatment Plan Summary: Daily contact with patient to assess and evaluate symptoms and progress in treatment and Medication management   Paula Dickson is a 50 year old female with a history of bipolar illness transferred from medical floor of [facility] where she was hospitalized for polydipsia and low sodium to treat psychotic disorganization and aggressive behavior in the context of treatment noncompliance.  1. Agitated behavior. This has resolved.  2. Mood. She was started on Depakote and low-dose Seroquel at [facility]. We'll discontinue Depakote due to elevated ammonia level. She was restarted on the Lthium on January 15. She did well on Risperdal in the past but it's possible that now it causes tremors. We'll offer Seroquel instead. We will increase the dose to 300 mg tonight. Lithium level 0.99 on 1/18.  3. Hypertension. She is on Norvasc.  4. Abnormal thyroid function tests. Repeated thyroid panel is normal.  5. Hyponatremia. This has resolved.  6. Insomnia. She slept 5 hours with trazodone.  7. STD. HIV, RPR and hepatitis panel were all negative  8. Metabolic syndrome. Lipid panel, hemoglobin A1c are not elevated.   9. Tremor. 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 We started propranolol. I will increase the dose to 25 mg 3 times a day. If she is unable to tolerate it we will offer Primidone.   10. Disposition. She will be discharged to home with her father. She will follow up with her regular psychiatrist in [place].  Savon Bordonaro 12/15/2015, 12:11 PM

## 2015-12-16 MED ORDER — ZOLPIDEM TARTRATE 5 MG PO TABS
5.0000 mg | ORAL_TABLET | Freq: Once | ORAL | Status: AC
  Administered 2015-12-16: 5 mg via ORAL
  Filled 2015-12-16: qty 1

## 2015-12-16 MED ORDER — QUETIAPINE FUMARATE ER 200 MG PO TB24
600.0000 mg | ORAL_TABLET | Freq: Every day | ORAL | Status: DC
  Administered 2015-12-16: 600 mg via ORAL
  Filled 2015-12-16: qty 3
  Filled 2015-12-16: qty 2

## 2015-12-16 MED ORDER — TEMAZEPAM 15 MG PO CAPS: 30.0000 mg | ORAL_CAPSULE | Freq: Every evening | ORAL | Status: DC | PRN

## 2015-12-16 MED ORDER — PROPRANOLOL HCL 10 MG PO TABS
20.0000 mg | ORAL_TABLET | Freq: Three times a day (TID) | ORAL | Status: DC
Start: 2015-12-16 — End: 2015-12-20
  Administered 2015-12-16 – 2015-12-19 (×11): 20 mg via ORAL
  Filled 2015-12-16 (×11): qty 2

## 2015-12-16 NOTE — BHH Group Notes (Signed)
[facility] Group Notes:  (Nursing/MHT/Case Management/Adjunct)  Date:  12/16/2015  Time:  1:49 PM  Type of Therapy:  Group Therapy  Participation Level:  Minimal  Participation Quality:  Attentive  Affect:  Flat  Cognitive:  Disorganized  Insight:  Lacking  Engagement in Group:  Lacking  Modes of Intervention:  Activity  Summary of Progress/Problems:  Paula Dickson 12/16/2015, 1:49 PM

## 2015-12-16 NOTE — BHH Group Notes (Signed)
[facility] LCSW Group Therapy  12/16/2015 12:55 PM  Type of Therapy:  Group Therapy  Participation Level:  Minimal  Participation Quality:  Attentive  Affect:  Flat  Cognitive:  Alert  Insight:  Limited  Engagement in Therapy:  Limited  Modes of Intervention:  Discussion, Education, Socialization and Support  Summary of Progress/Problems: Emotional Regulation: Patients will identify both negative and positive emotions. They will discuss emotions they have difficulty regulating and how they impact their lives. Patients will be asked to identify healthy coping skills to combat unhealthy reactions to negative emotions.  Paula Dickson attended group and stayed most of the time. She sat quietly and listened to other group members    [place] MSW, [place]  12/16/2015, 12:55 PM

## 2015-12-16 NOTE — Progress Notes (Addendum)
[facility] MD Progress Note  12/16/2015 11:40 AM Gwynne Muscarella  MRN:  [id number]  Subjective:  Ms. Cedar is extremely disorganized, intrusive, paranoid, unable to follow a conversation. She slept 1-1/2 hours only last night. She has been taking medications. 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 She is unable to participate in programming. We just learn from her sister that her disability hearing is on Tuesday, January 24. We hope to be able to discharge this patient to her family so she can attend the hearing.  Principal Problem: Bipolar I disorder, most recent episode manic, severe with psychotic features ([place]) Diagnosis:   Patient Active Problem List   Diagnosis Date Noted  . Hyperthyroidism [E05.90] 12/03/2015  . Acute encephalopathy [G93.40] 12/01/2015  . 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 Hyponatremia [E87.1] 11/30/2015  . Acute renal failure syndrome ([place]) [N17.9]   . Bipolar I disorder, most recent episode manic, severe with psychotic features ([place]) [F31.2] 06/14/2015  . Hypokalemia [E87.6] 06/13/2015  . HTN (hypertension) [I10] 04/02/2014   Total Time spent with patient: 20 minutes  Past Psychiatric History: Bipolar disorder.  Past Medical History:  Past Medical History  Diagnosis Date  . History of arthritis   . History of chicken pox   . History of depression   . History of genital warts   . history of heart murmur   . History of high blood pressure   . History of thyroid disease   . History of UTI   . Bipolar affective disorder ([place])   . Hypertension   . Schizophrenia [facility])     Past Surgical History  Procedure Laterality Date  . Cyst removal neck      around 11 years ago /benign  . Multiple tooth extractions    . Ablation on endometriosis     Family History:  Family History  Problem Relation Age of Onset  . Alcohol abuse Paternal Uncle   . Alcohol abuse Paternal Grandfather   . Arthritis Father   .  Hyperlipidemia Father   . High blood pressure Father   . Breast cancer Maternal Aunt   . Breast cancer Paternal Aunt   . High blood pressure Sister   . Diabetes Sister   . Diabetes Mother   . Mental illness Other     runs in family  . Diabetes Brother   . Mental illness Brother    Family Psychiatric  History: Severe depression. Social History:  History  Alcohol Use No     History  Drug Use No    Social History   Social History  . Marital Status: Single    Spouse Name: N/A  . Number of Children: N/A  . Years of Education: N/A   Social History Main Topics  . Smoking status: Never Smoker   . Smokeless tobacco: Never Used  . Alcohol Use: No  . Drug Use: No  . Sexual Activity: Not Currently   Other Topics Concern  . None   Social History Narrative   Additional Social History:                         Sleep: Poor  Appetite:  Fair  Current Medications: Current Facility-Administered Medications  Medication Dose Route Frequency Provider Last Rate Last Dose  . alum & mag hydroxide-simeth (MAALOX/MYLANTA) 200-200-20 MG/5ML suspension 30 mL  30 mL Oral Q4H PRN Usama Harkless B Kolbe Delmonaco, MD      . amLODipine (NORVASC) tablet  5 mg  5 mg Oral Daily Clovis Fredrickson, MD   5 mg at 12/16/15 0908  . lithium carbonate (ESKALITH) CR tablet 450 mg  450 mg Oral Q12H Hildred Priest, MD   450 mg at 12/16/15 0908  . magnesium hydroxide (MILK OF MAGNESIA) suspension 30 mL  30 mL Oral Daily PRN Ivelis Norgard B Amit Meloy, MD      . propranolol (INDERAL) tablet 10 mg  10 mg Oral TID Clovis Fredrickson, MD   10 mg at 12/16/15 0911  . QUEtiapine (SEROQUEL XR) 24 hr tablet 600 mg  600 mg Oral QHS Chelsei Mcchesney B Knolan Simien, MD      . temazepam (RESTORIL) capsule 30 mg  30 mg Oral QHS PRN Clovis Fredrickson, MD        Lab Results:  Results for orders placed or performed during the hospital encounter of 12/09/15 (from the past 48 hour(s))  Lithium level     Status: None    Collection Time: 12/15/15  8:10 AM  Result Value Ref Range   Lithium Lvl 0.99 0.60 - 1.20 mmol/L    Physical Findings: AIMS: Facial and Oral Movements Muscles of Facial Expression: None, normal Lips and Perioral Area: None, normal Jaw: None, normal Tongue: None, normal,Extremity Movements Upper (arms, wrists, hands, fingers): None, normal Lower (legs, knees, ankles, toes): None, normal, Trunk Movements Neck, shoulders, hips: None, normal, Overall Severity Severity of abnormal movements (highest score from questions above): None, normal Incapacitation due to abnormal movements: None, normal Patient's awareness of abnormal movements (rate only patient's report): No Awareness, Dental Status Current problems with teeth and/or dentures?: No Does patient usually wear dentures?: No  CIWA:  CIWA-Ar Total: 0 COWS:     Musculoskeletal: Strength & Muscle Tone: within normal limits Gait & Station: normal Patient leans: N/A  Psychiatric Specialty Exam: Review of Systems  Neurological: Positive for tremors.  All other systems reviewed and are negative.   Blood pressure 139/87, pulse 77, temperature 98.3 F (36.8 C), temperature source Oral, resp. rate 20, height 5\' 1"  (1.549 m), weight 57.153 kg (126 lb).Body mass index is 23.82 kg/(m^2).  General Appearance: Fairly Groomed  Engineer, water::  Good  Speech:  Pressured  Volume:  Increased  Mood:  Anxious and Irritable  Affect:  Labile  Thought Process:  Disorganized  Orientation:  Full (Time, Place, and Person)  Thought Content:  Delusions and Paranoid Ideation  Suicidal Thoughts:  No  Homicidal Thoughts:  No  Memory:  Immediate;   Fair Recent;   Fair Remote;   Fair  Judgement:  Poor  Insight:  Lacking  Psychomotor Activity:  Increased  Concentration:  Fair  Recall:  [employer] of Knowledge:Fair  Language: Fair  Akathisia:  No  Handed:  Right  AIMS (if indicated):     Assets:  Communication Skills Desire for  Improvement Financial Resources/Insurance Housing Physical Health Resilience Social Support  ADL's:  Intact  Cognition: WNL  Sleep:  Number of Hours: 1.5   Treatment Plan Summary: Daily contact with patient to assess and evaluate symptoms and progress in treatment and Medication management   Ms. Rung is a 50 year old female with a history of bipolar illness transferred from medical floor of [facility] where she was hospitalized for polydipsia and low sodium to treat psychotic disorganization and aggressive behavior in the context of treatment noncompliance.  1. Agitated behavior. This has resolved.  2. Mood. She was started on Depakote and low-dose Seroquel at [facility]. We'll discontinue Depakote due  to elevated ammonia level. She was restarted on the Lthium on January 15. She did well on Risperdal in the past but it's possible that now it causes tremors. We offered Seroquel instead. We will increase the dose to 600 mg tonight. Lithium level 0.99 on 1/18.  3. Hypertension. She is on Norvasc.  4. Abnormal thyroid function tests. Repeated thyroid panel is normal.  5. Hyponatremia. This has resolved.  6. Insomnia. She slept 5 hours with trazodone.  7. STD. HIV, RPR and hepatitis panel were all negative  8. Metabolic syndrome. Lipid panel, hemoglobin A1c are not elevated.   9. Tremor. 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 We started propranolol. I will increase the dose to 20 mg 3 times a day. If she is unable to tolerate it we will offer Primidone.   10. Disposition. She will be discharged to home with her father. She will follow up with her regular psychiatrist in [place].  Triniti Gruetzmacher 12/16/2015, 11:40 AM

## 2015-12-16 NOTE — Progress Notes (Signed)
Ambien effective, Pt currently resting with eyes closed.

## 2015-12-16 NOTE — Tx Team (Signed)
Interdisciplinary Treatment Plan Update (Adult)  Date:  12/16/2015 Time Reviewed:  1:16 PM  Progress in Treatment: Attending groups: Yes. Participating in groups:  Yes. Taking medication as prescribed:  Yes. Tolerating medication:  Yes. Family/Significant othe contact made:  Yes, individual(s) contacted:  patient's sister Research officer, trade union Patient understands diagnosis:  No. and As evidenced by:  patient unable to identify her symptoms for hospitalization Discussing patient identified problems/goals with staff:  Yes. Medical problems stabilized or resolved:  Yes. Denies suicidal/homicidal ideation: Yes. Issues/concerns per patient self-inventory:  No. Other:  New problem(s) identified: No, Describe:  none reported  Discharge Plan or Barriers:Patient will stabilize on medication and discharge home will outpatient follow up in place.  Reason for Continuation of Hospitalization: Aggression Mania Medication stabilization Other; describe paranoia  Comments:  Estimated length of stay: up to 4 days expected discharge Monday 12/20/15  New goal(s):  Review of initial/current patient goals per problem list:   1.  Goal(s):participate in aftercare planning  Met:  No  Target date:by discharge  As evidenced [id number] participation will participate in aftercare plan AEB aftercare provider and housing plan at discharge being identified  2.  Goal (s):decrease psychosis  Met:  No  Target date:by discharge  As evidenced [id number] demonstrates decreased signs and symptoms of psychosis  3.  Goal(s):decrease agression  Met:  No  Target date:by discharge  As evidenced [id number] demonstration no signs and symptoms of aggression   Attendees: Physician:  [name], MD 1/19/20171:16 PM  Nursing:   Carolynn Sayers, RN 1/19/20171:16 PM  Other:  Carmell Austria, [place] 1/19/20171:16 PM  Other:   1/19/20171:16 PM  Other:   1/19/20171:16 PM  Other:  1/19/20171:16 PM  Other:   1/19/20171:16 PM  Other:  1/19/20171:16 PM  Other:  1/19/20171:16 PM  Other:  1/19/20171:16 PM  Other:  1/19/20171:16 PM  Other:   1/19/20171:16 PM   Scribe for Treatment Team:   Keene Breath, MSW, LCSWA [phone]  12/16/2015, 1:16 PM

## 2015-12-16 NOTE — Progress Notes (Signed)
D:Patient remains  Disorganizes and confused.Came to nursing station voice she had just urinated on herself  And was getting  Ready to have a baby.Patient requesting an ACT Team. Patient stated slept fair last night . Staff has a log  Of patient sleeping 1.5 hours.  Stated appetite is good and energy level  Is normal. Stated concentration is good . Stated on Depression scale , 4 hopeless 5 and anxiety 0 .( low 0-10 high) Denies suicidal  homicidal ideations  responding to internal  stimuli . No pain concerns . Appropriate ADL'S. Interacting with peers and staff.  A: Encourage patient participation with unit programming . Instruction  Given on  Medication , verbalize understanding. R: Voice no other concerns. Staff continue to monitor

## 2015-12-16 NOTE — Progress Notes (Signed)
Doctor on call returned call  At 0410 and ordered Pt 5mg  of Ambien. Pt had still not slept and was still needing constant redirection to go into her room. She has remained anxious and tearful all evening, stating that no one here was helping her. Anxious regarding a possible discharge. Will continue to monitor.

## 2015-12-16 NOTE — Progress Notes (Addendum)
D: Patient has been very anxious this shift. Unable to sleep and pacing halls and standing at nurses station for long periods of time. Fixated on leaving tomorrow. Pt requested sleeping medication. This Event organiser on call x 4 and called doctor on call cell phone x 1. The doctor on call never called back. Frequent redirection and encouragement needed to get Pt back into her room. Pt. Frequently coming out asking for sleeping medication and worried about discharged tomorrow.  A: Medication was given with education. Encouragement was provided. Redirection provided. Will continue to attempt to contact on call doctor. Q15 minute checks maintained for safety. R: Patient was compliant with evening medication. Attended group but monopolized the group. Tearful at times and anxious but able to be redirected. Unable to sleep. Will continue to monitor.

## 2015-12-16 NOTE — Progress Notes (Signed)
Recreation Therapy Notes  Date: 01.19.17 Time: 3:00 pm Location: Craft Room  Group Topic: Leisure Education  Goal Area(s) Addresses:  Patient will identify activities for each letter of the alphabet. Patient will verbalize ability to integrate positive leisure into life post d/c. Patient will verbalize ability to use leisure as a Technical sales engineer.  Behavioral Response: Did not attend   Intervention: Leisure Alphabet  Activity: Patients were given a Leisure Air traffic controller and instructed to list healthy leisure activities for each letter of the alphabet.  Education: LRT educated patients on what they needed to participate in leisure.  Education Outcome: Patient did not attend group.   Clinical Observations/Feedback: Patient did not attend group.  Leonette [name], LRT/CTRS 12/16/2015 4:16 PM

## 2015-12-17 MED ORDER — PROPRANOLOL HCL 20 MG PO TABS: 20.0000 mg | ORAL_TABLET | Freq: Three times a day (TID) | ORAL | Status: DC

## 2015-12-17 MED ORDER — TEMAZEPAM 30 MG PO CAPS: 30.0000 mg | ORAL_CAPSULE | Freq: Every evening | ORAL | Status: DC | PRN

## 2015-12-17 MED ORDER — QUETIAPINE FUMARATE 400 MG PO TABS: 800.0000 mg | ORAL_TABLET | Freq: Every day | ORAL | Status: DC

## 2015-12-17 MED ORDER — LITHIUM CARBONATE ER 450 MG PO TBCR: 450.0000 mg | EXTENDED_RELEASE_TABLET | Freq: Two times a day (BID) | ORAL | Status: DC

## 2015-12-17 MED ORDER — QUETIAPINE FUMARATE 200 MG PO TABS
800.0000 mg | ORAL_TABLET | Freq: Every day | ORAL | Status: DC
  Administered 2015-12-17 – 2015-12-19 (×3): 800 mg via ORAL
  Filled 2015-12-17 (×3): qty 4

## 2015-12-17 MED ORDER — AMLODIPINE BESYLATE 5 MG PO TABS: 5.0000 mg | ORAL_TABLET | Freq: Every day | ORAL | Status: DC

## 2015-12-17 NOTE — BHH Group Notes (Signed)
[place] Group Notes:  (Nursing/MHT/Case Management/Adjunct)  Date:  12/17/2015  Time:  12:28 PM  Type of Therapy:  Psychoeducational Skills  Participation Level:  Did Not Attend  Paula Dickson De'Chelle Sacha Topor 12/17/2015, 12:28 PM

## 2015-12-17 NOTE — Plan of Care (Signed)
Problem: Alteration in thought process Goal: LTG-Patient behavior demonstrates decreased signs psychosis (Patient behavior demonstrates decreased signs of psychosis to the point the patient is safe to return home and continue treatment in an outpatient setting.)  Outcome: Progressing Denies suicidal or homicidal ideations & AV hallucinations.

## 2015-12-17 NOTE — Progress Notes (Signed)
Patient still have some paranoid ideations like people steal her mail.She verbalized her concern about paying her bills & her disability.At times she is intrusive but easily redirectable.Denies suicidal & homicidal ideations.Compliant with medications.

## 2015-12-17 NOTE — Progress Notes (Signed)
Patient has continued to be very needy. She is constantly coming up to the nurses station with different requests. She denies SI/HI/AVH. She's delusional and paranoid in her thought process. She was very hesitant to take the Seroquel 600mg  but after going back and forth for about 10 mins took it. She was compliant with her other medications. She denies pain. She has episodes of urine incontinence when talking to another nurse. Encouragement was provided. Safety maintained with 15 min checks.

## 2015-12-17 NOTE — Progress Notes (Signed)
[facility] MD Progress Note  12/17/2015 1:20 PM Paula Dickson  MRN:  [id number]  Subjective:  Paula Dickson has a long history of bipolar disorder admitted in a manic, disorganized, psychotic episode. She was initially hospitalized on medicine at [facility] for AMS due to low sodium and potomania. This has resolved. She accepts medications and tolerates them well but in spite of some proggerss, she is still rather paranoid, worried about "clan people coming to her house, people watching her, stealing her mail and money from the bank". She is less intrusive and easily redirectable. She has little insight into her problems. She is not suicidal or homicidal and has supportive family. She had severe insomnia but sleeps better with Restoril. She has severe tremor that was exacerbated by Risperdal. She is better on Seroquel. 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  We plan to discharge her into her sister's care on Monday morning as she has disability hearing later that day.   Principal Problem: Bipolar I disorder, most recent episode manic, severe with psychotic features ([place]) Diagnosis:   Patient Active Problem List   Diagnosis Date Noted  . Hyperthyroidism [E05.90] 12/03/2015  . 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 Bipolar I disorder, most recent episode manic, severe with psychotic features ([place]) [F31.2] 06/14/2015   Total Time spent with patient: 20 minutes  Past Psychiatric History: bipolar disorder.  Past Medical History:  Past Medical History  Diagnosis Date  . History of arthritis   . History of chicken pox   . History of depression   . History of genital warts   . history of heart murmur   . History of high blood pressure   . History of thyroid disease   . History of UTI   . Bipolar affective disorder ([place])   . Hypertension   . Schizophrenia [facility])     Past Surgical History  Procedure Laterality Date  . Cyst removal neck      around 11 years ago /benign  .  Multiple tooth extractions    . Ablation on endometriosis     Family History:  Family History  Problem Relation Age of Onset  . Alcohol abuse Paternal Uncle   . Alcohol abuse Paternal Grandfather   . Arthritis Father   . Hyperlipidemia Father   . High blood pressure Father   . Breast cancer Maternal Aunt   . Breast cancer Paternal Aunt   . High blood pressure Sister   . Diabetes Sister   . Diabetes Mother   . Mental illness Other     runs in family  . Diabetes Brother   . Mental illness Brother    Family Psychiatric  History: depression, bipolar. Social History:  History  Alcohol Use No     History  Drug Use No    Social History   Social History  . Marital Status: Single    Spouse Name: N/A  . Number of Children: N/A  . Years of Education: N/A   Social History Main Topics  . Smoking status: Never Smoker   . Smokeless tobacco: Never Used  . Alcohol Use: No  . Drug Use: No  . Sexual Activity: Not Currently   Other Topics Concern  . None   Social History Narrative   Additional Social History:                         Sleep: Good  Appetite:  Good  Current Medications: Current  Facility-Administered Medications  Medication Dose Route Frequency Provider Last Rate Last Dose  . alum & mag hydroxide-simeth (MAALOX/MYLANTA) 200-200-20 MG/5ML suspension 30 mL  30 mL Oral Q4H PRN Ramyah Pankowski B Heloise Gordan, MD      . amLODipine (NORVASC) tablet 5 mg  5 mg Oral Daily Jef Futch B Jacobs Golab, MD   5 mg at 12/17/15 0910  . lithium carbonate (ESKALITH) CR tablet 450 mg  450 mg Oral Q12H Hildred Priest, MD   450 mg at 12/17/15 0911  . magnesium hydroxide (MILK OF MAGNESIA) suspension 30 mL  30 mL Oral Daily PRN Candiace West B Nyree Applegate, MD      . propranolol (INDERAL) tablet 20 mg  20 mg Oral TID Clovis Fredrickson, MD   20 mg at 12/17/15 0910  . QUEtiapine (SEROQUEL) tablet 800 mg  800 mg Oral QHS Seniyah Esker B Jerrica Thorman, MD      . temazepam (RESTORIL) capsule 30  mg  30 mg Oral QHS PRN Schuyler Behan B Jama Krichbaum, MD        Lab Results: No results found for this or any previous visit (from the past 48 hour(s)).  Physical Findings: AIMS: Facial and Oral Movements Muscles of Facial Expression: None, normal Lips and Perioral Area: None, normal Jaw: None, normal Tongue: None, normal,Extremity Movements Upper (arms, wrists, hands, fingers): None, normal Lower (legs, knees, ankles, toes): None, normal, Trunk Movements Neck, shoulders, hips: None, normal, Overall Severity Severity of abnormal movements (highest score from questions above): None, normal Incapacitation due to abnormal movements: None, normal Patient's awareness of abnormal movements (rate only patient's report): No Awareness, Dental Status Current problems with teeth and/or dentures?: No Does patient usually wear dentures?: No  CIWA:  CIWA-Ar Total: 0 COWS:     Musculoskeletal: Strength & Muscle Tone: within normal limits Gait & Station: normal Patient leans: N/A  Psychiatric Specialty Exam: Review of Systems  Neurological: Positive for tremors.  All other systems reviewed and are negative.   Blood pressure 124/72, pulse 76, temperature 98.3 F (36.8 C), temperature source Oral, resp. rate 20, height 5\' 1"  (1.549 m), weight 57.153 kg (126 lb).Body mass index is 23.82 kg/(m^2).  General Appearance: Fairly Groomed  Engineer, water::  Good  Speech:  Pressured  Volume:  Normal  Mood:  Dysphoric  Affect:  Congruent  Thought Process:  Disorganized  Orientation:  Full (Time, Place, and Person)  Thought Content:  Delusions and Paranoid Ideation  Suicidal Thoughts:  No  Homicidal Thoughts:  No  Memory:  Immediate;   Fair Recent;   Fair Remote;   Fair  Judgement:  Poor  Insight:  Shallow  Psychomotor Activity:  Increased  Concentration:  Fair  Recall:  [employer] of Knowledge:Fair  Language: Fair  Akathisia:  No  Handed:  Right  AIMS (if indicated):     Assets:  Communication  Skills Desire for Improvement Financial Resources/Insurance Housing Physical Health Resilience Social Support  ADL's:  Intact  Cognition: WNL  Sleep:  Number of Hours: 5.75   Treatment Plan Summary: Daily contact with patient to assess and evaluate symptoms and progress in treatment and Medication management   Ms. Norby is a 50 year old female with a history of bipolar illness transferred from medical floor of [facility] where she was hospitalized for polydipsia and low sodium to treat psychotic disorganization and aggressive behavior in the context of treatment noncompliance.  1. Agitated behavior. This has resolved.  2. Mood. She was started on Depakote and low-dose Seroquel at [facility]. We discontinued Depakote  due to elevated ammonia level. She was restarted on the Lthium on January 15. She did well on Risperdal in the past but it's possible that now it causes tremors. We offered Seroquel instead. We will increase the dose again to 800 mg tonight. Lithium level 0.99 on 1/18.  3. Hypertension. She was on Norvasc.  4. Abnormal thyroid function tests. Repeated thyroid panel was normal.  5. Hyponatremia. This has resolved.  6. Insomnia. She did not respond adequately to low dose clonazepam or trazodone. She slept almost 6 hours with 30 mg of Restoril.  7. STD. HIV, RPR and hepatitis panel were all negative  8. Metabolic syndrome. Lipid panel, hemoglobin A1c are not elevated.   9. Tremor. The patient was treated with propranolol in the past suggestive of familial benign tremor. We restarted propranolol with improvement.    10. Disposition. She will be discharged to home with her family on Monday morning to participate in disability court. She will follow up with [facility] in [place].   Ladarious Kresse 12/17/2015, 1:20 PM

## 2015-12-17 NOTE — Plan of Care (Signed)
Problem: Alteration in thought process Goal: LTG-Patient is able to perceive the environment accurately Outcome: Progressing When asked to come to an area on the unit the patient is able to comprehend

## 2015-12-17 NOTE — Progress Notes (Signed)
Recreation Therapy Notes  Date: 01.20.17 Time: 3:00 pm Location: Craft Room  Group Topic: Coping Skills  Goal Area(s) Addresses:  Patient will participate in healthy coping skill.  Patient will verbalize one emotion experienced in group.  Behavioral Response: Did not attend  Intervention: Coloring  Activity: Patients were given coloring sheets and instructed to color.  Education: LRT educated patients to color.  Education Outcome: Patient did not attend group.   Clinical Observations/Feedback: Patient did not attend group.  Leonette [name], LRT/CTRS 12/17/2015 4:31 PM

## 2015-12-18 LAB — URINALYSIS COMPLETE WITH MICROSCOPIC (ARMC ONLY)
Bilirubin Urine: NEGATIVE
Glucose, UA: NEGATIVE mg/dL
Hgb urine dipstick: NEGATIVE
Ketones, ur: NEGATIVE mg/dL
Nitrite: NEGATIVE
Protein, ur: NEGATIVE mg/dL
Specific Gravity, Urine: 1.004 — ABNORMAL LOW (ref 1.005–1.030)
pH: 8 (ref 5.0–8.0)

## 2015-12-18 NOTE — BHH Group Notes (Signed)
[place] LCSW Group Therapy  12/18/2015 1:39 PM  Type of Therapy:  Group Therapy  Participation Level:  Did Not Attend  Modes of Intervention:  Discussion, Education, Socialization and Support  Summary of Progress/Problems: Feelings around Relapse. Group members discussed the meaning of relapse and shared personal stories of relapse, how it affected them and others, and how they perceived themselves during this time. Group members were encouraged to identify triggers, warning signs and coping skills used when facing the possibility of relapse. Social supports were discussed and explored in detail.    [place] MSW, [place]  12/18/2015, 1:39 PM

## 2015-12-18 NOTE — Progress Notes (Addendum)
[facility] MD Progress Note  12/18/2015 1:00 PM Paula Dickson  MRN:  [id number]  Subjective:  Paula Dickson has a long history of bipolar disorder admitted in a manic, disorganized, psychotic episode. Per nursing patient has been making assertions that her medicine, Seroquel is been messing up her face. When I met with patient today she then seemed to be ambivalent about this assertion asking me if I noticed anything was wrong and I indicated I did not. I asked her what she thought the medicine was doing to her face and she stated she felt it was making her look "crazy." She states that she feels she is getting too much Seroquel however she asserts that 50 mg is a better dose for her. I explained that would not be a therapeutic dose to address mood or psychosis. Patient listened and stated she now agrees to the medicine based on our recommendations. However it does appear she goes back and forth in regards to her resistance to medications.  We plan to discharge her into her sister's care on Monday morning as she has disability hearing later that day.   Principal Problem: Bipolar I disorder, most recent episode manic, severe with psychotic features ([place]) Diagnosis:   Patient Active Problem List   Diagnosis Date Noted  . Hyperthyroidism [E05.90] 12/03/2015  . 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 Bipolar I disorder, most recent episode manic, severe with psychotic features ([place]) [F31.2] 06/14/2015   Total Time spent with patient: 20 minutes  Past Psychiatric History: bipolar disorder.  Past Medical History:  Past Medical History  Diagnosis Date  . History of arthritis   . History of chicken pox   . History of depression   . History of genital warts   . history of heart murmur   . History of high blood pressure   . History of thyroid disease   . History of UTI   . Bipolar affective disorder ([place])   . Hypertension   . Schizophrenia [facility])     Past Surgical History  Procedure  Laterality Date  . Cyst removal neck      around 11 years ago /benign  . Multiple tooth extractions    . Ablation on endometriosis     Family History:  Family History  Problem Relation Age of Onset  . Alcohol abuse Paternal Uncle   . Alcohol abuse Paternal Grandfather   . Arthritis Father   . Hyperlipidemia Father   . High blood pressure Father   . Breast cancer Maternal Aunt   . Breast cancer Paternal Aunt   . High blood pressure Sister   . Diabetes Sister   . Diabetes Mother   . Mental illness Other     runs in family  . Diabetes Brother   . Mental illness Brother    Family Psychiatric  History: depression, bipolar. Social History:  History  Alcohol Use No     History  Drug Use No    Social History   Social History  . Marital Status: Single    Spouse Name: N/A  . Number of Children: N/A  . Years of Education: N/A   Social History Main Topics  . Smoking status: Never Smoker   . Smokeless tobacco: Never Used  . Alcohol Use: No  . Drug Use: No  . Sexual Activity: Not Currently   Other Topics Concern  . None   Social History Narrative   Additional Social History:  Sleep: Good  Appetite:  Good  Current Medications: Current Facility-Administered Medications  Medication Dose Route Frequency Provider Last Rate Last Dose  . alum & mag hydroxide-simeth (MAALOX/MYLANTA) 200-200-20 MG/5ML suspension 30 mL  30 mL Oral Q4H PRN Jolanta B Pucilowska, MD      . amLODipine (NORVASC) tablet 5 mg  5 mg Oral Daily Jolanta B Pucilowska, MD   5 mg at 12/18/15 0834  . lithium carbonate (ESKALITH) CR tablet 450 mg  450 mg Oral Q12H Hildred Priest, MD   450 mg at 12/18/15 0834  . magnesium hydroxide (MILK OF MAGNESIA) suspension 30 mL  30 mL Oral Daily PRN Jolanta B Pucilowska, MD      . propranolol (INDERAL) tablet 20 mg  20 mg Oral TID Clovis Fredrickson, MD   20 mg at 12/18/15 0834  . QUEtiapine (SEROQUEL) tablet 800 mg  800  mg Oral QHS Jolanta B Pucilowska, MD   800 mg at 12/17/15 2153  . temazepam (RESTORIL) capsule 30 mg  30 mg Oral QHS PRN Jolanta B Pucilowska, MD        Lab Results: No results found for this or any previous visit (from the past 48 hour(s)).  Physical Findings: AIMS: Facial and Oral Movements Muscles of Facial Expression: None, normal Lips and Perioral Area: None, normal Jaw: None, normal Tongue: None, normal,Extremity Movements Upper (arms, wrists, hands, fingers): None, normal Lower (legs, knees, ankles, toes): None, normal, Trunk Movements Neck, shoulders, hips: None, normal, Overall Severity Severity of abnormal movements (highest score from questions above): None, normal Incapacitation due to abnormal movements: None, normal Patient's awareness of abnormal movements (rate only patient's report): No Awareness, Dental Status Current problems with teeth and/or dentures?: No Does patient usually wear dentures?: No  CIWA:  CIWA-Ar Total: 0 COWS:     Musculoskeletal: Strength & Muscle Tone: within normal limits Gait & Station: normal Patient leans: N/A  Psychiatric Specialty Exam: Review of Systems  Neurological: Negative for tremors.  Psychiatric/Behavioral: Negative for depression, suicidal ideas, hallucinations, memory loss and substance abuse. The patient is nervous/anxious (surrounding taking her medicine and feeling it is necessary). The patient does not have insomnia.   All other systems reviewed and are negative.   Blood pressure 139/85, pulse 72, temperature 98.6 F (37 C), temperature source Oral, resp. rate 20, height _0  (1.549 m), weight 57.153 kg (126 lb).Body mass index is 23.82 kg/(m^2).  General Appearance: Fairly Groomed  Engineer, water::  Good  Speech:  Pressured  Volume:  Normal  Mood:  Okay  Affect:  Congruent  Thought Process:  Loose  Orientation:  Full (Time, Place, and Person)  Thought Content:  Delusions and Paranoid Ideation  Suicidal Thoughts:  No   Homicidal Thoughts:  No  Memory:  Immediate;   Fair Recent;   Fair Remote;   Fair  Judgement:  Poor  Insight:  Shallow  Psychomotor Activity:  Increased  Concentration:  Fair  Recall:  [employer] of Knowledge:Fair  Language: Fair  Akathisia:  No  Handed:  Right  AIMS (if indicated):     Assets:  Communication Skills Desire for Improvement Financial Resources/Insurance Housing Physical Health Resilience Social Support  ADL's:  Intact  Cognition: WNL  Sleep:  Number of Hours: 5   Treatment Plan Summary: Daily contact with patient to assess and evaluate symptoms and progress in treatment and Medication management   Paula Dickson is a 50 year old female with a history of bipolar illness transferred from medical floor of [facility]  where she was hospitalized for polydipsia and low sodium to treat psychotic disorganization and aggressive behavior in the context of treatment noncompliance.  1. Agitated behavior. This has resolved.  2. Mood. She was started on Depakote and low-dose Seroquel at [facility]. We discontinued Depakote due to elevated ammonia level. She was restarted on the Lthium on January 15. She did well on Risperdal in the past but it's possible that now it causes tremors. We offered Seroquel instead. We will increase the dose again to 800 mg tonight. Lithium level 0.99 on 1/18.  3. Hypertension. She was on Norvasc.  4. Abnormal thyroid function tests. Repeated thyroid panel was normal.  5. Hyponatremia. This has resolved.  6. Insomnia. She did not respond adequately to low dose clonazepam or trazodone. She slept almost 6 hours with 30 mg of Restoril.  7. STD. HIV, RPR and hepatitis panel were all negative  8. Metabolic syndrome. Lipid panel, hemoglobin A1c are not elevated.   9. Tremor. The patient was treated with propranolol in the past suggestive of familial benign tremor. We restarted propranolol with improvement.    10. Disposition. She will be discharged  to home with her family on Monday morning to participate in disability court. [facility] [place].   Faith Rogue 12/18/2015, 1:00 PM

## 2015-12-18 NOTE — Progress Notes (Signed)
D: Observed pt walking in the hallways. Patient alert and oriented x4. Patient denies SI/HI/AVH. Pt affect is anxious and labile.Pt is guarded, paranoid, and suspicious. Pt is preoccupied stating multiple times "I don't want to say the wrong thing..I'm not in trouble am I?" Pt content is disorganized. Pt is needy and intrusive. A: Offered active listening and support. Provided therapeutic communication. Educated pt on medications and administered scheduled medications. Encouraged pt to share thoughts and feelings with staff and reinforced is here for support. R: Pt pleasant and cooperative. Pt medication compliant. Pt easily redirectable. Will continue Q15 min. checks. Safety maintained.

## 2015-12-18 NOTE — BHH Group Notes (Addendum)
[facility] LCSW Aftercare Discharge Planning Group Note   12/18/2015 10:18 AM  (late entry for 12/17/15)   Participation Quality:  Active   Mood/Affect:  Flat  Depression Rating:  Unable to rate   Anxiety Rating:  Unable to rate  Thoughts of Suicide:  No Will you contract for safety?   NA  Current AVH:  No  Plan for Discharge/Comments:  Pt plans to return home and follow up with outpatient.  She states she feels like it is better for her not to say anything. She states no one has given her "feedback" so that she can work on Art therapist. When asked what kind of feedback she is looking for and she could not elaborate. She believes she will be going to jail or prison after discharge. When asked why she would be going to jail, she is unable to state. Towards the end of group, she became tearful asking for "feedback."   Transportation Means: family   Supports: family   [place] MSW, [place]

## 2015-12-18 NOTE — Plan of Care (Signed)
Problem: Alteration in thought process Goal: LTG-Patient behavior demonstrates decreased signs psychosis (Patient behavior demonstrates decreased signs of psychosis to the point the patient is safe to return home and continue treatment in an outpatient setting.)  Outcome: Progressing Pt still paranoid and suspicious, but denies AVH and does not seem to be responding to internal simuli.

## 2015-12-18 NOTE — Progress Notes (Signed)
D:  Per pt self inventory pt reports sleeping good, appetite good, energy level normal, ability to pay attention normal, rates depression at a 7 out of 10, hopelessness at an 8 out of 10, anxiety at a 6 out of 10, denies SI/HI/AVH, disorganized/anxious during interaction, hand tremor, goal today: "to be discharged on Monday", pt is disorganized and needs redirection, comes up to nurses station with repeated requests and asking the same questions frequently.     A:  Emotional support provided, Encouraged pt to continue with treatment plan and attend all group activities, q15 min checks maintained for safety.  R:  Pt is receptive, going to groups, needs frequent redirection, irritable and suspicious with staff and other patients on the unit, med compliant but is suspicious and wants to see the packaging.

## 2015-12-18 NOTE — BHH Group Notes (Signed)
[place] LCSW Group Therapy  12/18/2015 10:22 AM (late entry for 12/17/15)   Type of Therapy:  Group Therapy  Participation Level:  Did Not Attend  Modes of Intervention:  Discussion, Education, Socialization and Support  Summary of Progress/Problems: Balance in life: Patients will discuss the concept of balance and how it looks and feels to be unbalanced. Pt will identify areas in their life that is unbalanced and ways to become more balanced.    [place] MSW, LCSWA  12/18/2015, 10:22 AM

## 2015-12-19 NOTE — Progress Notes (Signed)
Pt has been pleasant and cooperative. Pt's mood and affect has been brighter. Pt has been active on the unit.Pt denies SI and A/V hallucinations Pt completed self inventory and was able to discuss with staff. Pt was able to contract for safety.

## 2015-12-19 NOTE — Progress Notes (Signed)
  [facility] Adult Case Management Discharge Plan :  Will you be returning to the same living situation after discharge:  Yes,  Home At discharge, do you have transportation home?: Yes,  sister Do you have the ability to pay for your medications: Yes,  mental health  Release of information consent forms completed and in the chart;  Patient's signature needed at discharge.  Patient to Follow up at: Follow-up Information    Follow up with [facility].   Why:  Patient is referred to ACT services but will continue services with her current provider until ACT services are in place.   Contact information:   [id number] [place], [place] [address] Ph [phone] Fax [phone]       Follow up with [name] Go on 12/22/2015.   Why:  For follow-up care appt on Wednesday 12/22/15 at 9:00am    Contact information:   [place], [place] Ph [phone] Fax [phone] (walk in hours M-F 8am-4pm)       Next level of care provider has access to [place] and Suicide Prevention discussed: Yes,  with patient and sister  Have you used any form of tobacco in the last 30 days? (Cigarettes, Smokeless Tobacco, Cigars, and/or Pipes): No  Has patient been referred to the Quitline?: Patient refused referral  Patient has been referred for addiction treatment: Pt. refused referral  Wray Kearns MSW, [place]  12/19/2015, 4:09 PM

## 2015-12-19 NOTE — Progress Notes (Signed)
D: Pt affect is anxious and labile this evening. Denies SI/HI/AVH at this time. Denies pain. Pt is paranoid and suspicious during medication pass, stating "800 mg of seroquel is too much. I don't take that much." Pt remains focused on discharge on Monday, stating multiple times "I just need clarification that I am going home on Monday morning, no one is giving me an answer." A: Emotional support provided. Pt takes evening medications with encouragement. Pt is assured that the plan is for her to be discharged Monday morning. q15 minute safety checks maintained. R: Pt remains free from harm.

## 2015-12-19 NOTE — BHH Group Notes (Signed)
[place] LCSW Group Therapy  12/19/2015 3:05 PM  Type of Therapy:  Group Therapy  Participation Level:  Minimal  Participation Quality:  Attentive  Affect:  Appropriate  Cognitive:  Alert  Insight:  Limited  Engagement in Therapy:  Limited  Modes of Intervention:  Discussion, Education, Socialization and Support  Summary of Progress/Problems:  Mindfulness: Patient discussed mindfulness and relaxing techniques and why they are beneficial. Pt discussed ways to incorporate mindfulness in their lives. Pt practiced a mindfulness techique and discussed how it made them feel. Pt attended group and stayed most of the time. Pt sat quietly and listened to group members share.   [place] MSW, [place]  12/19/2015, 3:05 PM

## 2015-12-19 NOTE — Plan of Care (Signed)
Problem: Ineffective individual coping Goal: STG: Patient will remain free from self harm Outcome: Progressing Pt remains free from harm  Problem: Alteration in thought process Goal: STG-Patient is able to discuss thoughts with staff Outcome: Progressing Pt discusses concerns with staff and discusses thoughts about medications

## 2015-12-19 NOTE — Progress Notes (Signed)
[facility] MD Progress Note  12/19/2015 1:50 PM Paula Dickson  MRN:  [id number]  Subjective:  Paula Dickson has a long history of bipolar disorder admitted in a manic, disorganized, psychotic episode. Nursing reported patient was a little more organized. Patient's focus today is making sure she is ready for discharge. It appears Dr. William Dalton early written the discharge order. I discussed with nursing that it does appear the patient might need to leave early in the morning given there is a note that she has a disability hearing tomorrow. Patient denies any physical complaints and she reports her mood is good. States she is looking forward to going home with her father.  We plan to discharge her into her sister's care on Monday morning as she has disability hearing later that day.   Principal Problem: Bipolar I disorder, most recent episode manic, severe with psychotic features ([place]) Diagnosis:   Patient Active Problem List   Diagnosis Date Noted  . Hyperthyroidism [E05.90] 12/03/2015  . 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 Bipolar I disorder, most recent episode manic, severe with psychotic features ([place]) [F31.2] 06/14/2015   Total Time spent with patient: 20 minutes  Past Psychiatric History: bipolar disorder.  Past Medical History:  Past Medical History  Diagnosis Date  . History of arthritis   . History of chicken pox   . History of depression   . History of genital warts   . history of heart murmur   . History of high blood pressure   . History of thyroid disease   . History of UTI   . Bipolar affective disorder ([place])   . Hypertension   . Schizophrenia [facility])     Past Surgical History  Procedure Laterality Date  . Cyst removal neck      around 11 years ago /benign  . Multiple tooth extractions    . Ablation on endometriosis     Family History:  Family History  Problem Relation Age of Onset  . Alcohol abuse Paternal Uncle   . Alcohol abuse Paternal Grandfather   .  Arthritis Father   . Hyperlipidemia Father   . High blood pressure Father   . Breast cancer Maternal Aunt   . Breast cancer Paternal Aunt   . High blood pressure Sister   . Diabetes Sister   . Diabetes Mother   . Mental illness Other     runs in family  . Diabetes Brother   . Mental illness Brother    Family Psychiatric  History: depression, bipolar. Social History:  History  Alcohol Use No     History  Drug Use No    Social History   Social History  . Marital Status: Single    Spouse Name: N/A  . Number of Children: N/A  . Years of Education: N/A   Social History Main Topics  . Smoking status: Never Smoker   . Smokeless tobacco: Never Used  . Alcohol Use: No  . Drug Use: No  . Sexual Activity: Not Currently   Other Topics Concern  . None   Social History Narrative   Additional Social History:                         Sleep: Good  Appetite:  Good  Current Medications: Current Facility-Administered Medications  Medication Dose Route Frequency Provider Last Rate Last Dose  . alum & mag hydroxide-simeth (MAALOX/MYLANTA) 200-200-20 MG/5ML suspension 30 mL  30 mL Oral Q4H PRN Jolanta  B Pucilowska, MD      . amLODipine (NORVASC) tablet 5 mg  5 mg Oral Daily Clovis Fredrickson, MD   5 mg at 12/19/15 0926  . lithium carbonate (ESKALITH) CR tablet 450 mg  450 mg Oral Q12H Hildred Priest, MD   450 mg at 12/19/15 0926  . magnesium hydroxide (MILK OF MAGNESIA) suspension 30 mL  30 mL Oral Daily PRN Jolanta B Pucilowska, MD      . propranolol (INDERAL) tablet 20 mg  20 mg Oral TID Clovis Fredrickson, MD   20 mg at 12/19/15 0925  . QUEtiapine (SEROQUEL) tablet 800 mg  800 mg Oral QHS Jolanta B Pucilowska, MD   800 mg at 12/18/15 2128  . temazepam (RESTORIL) capsule 30 mg  30 mg Oral QHS PRN Clovis Fredrickson, MD        Lab Results:  Results for orders placed or performed during the hospital encounter of 12/09/15 (from the past 48 hour(s))   Urinalysis complete, with microscopic ([facility] only)     Status: Abnormal   Collection Time: 12/18/15 12:40 PM  Result Value Ref Range   Color, Urine YELLOW (A) YELLOW   [place]earance HAZY (A) CLEAR   Glucose, UA NEGATIVE NEGATIVE mg/dL   Bilirubin Urine NEGATIVE NEGATIVE   Ketones, ur NEGATIVE NEGATIVE mg/dL   Specific Gravity, Urine 1.004 (L) 1.005 - 1.030   Hgb urine dipstick NEGATIVE NEGATIVE   pH 8.0 5.0 - 8.0   Protein, ur NEGATIVE NEGATIVE mg/dL   Nitrite NEGATIVE NEGATIVE   Leukocytes, UA 3+ (A) NEGATIVE   RBC / HPF 0-5 0 - 5 RBC/hpf   WBC, UA 6-30 0 - 5 WBC/hpf   Bacteria, UA RARE (A) NONE SEEN   Squamous Epithelial / LPF 6-30 (A) NONE SEEN   Mucous PRESENT    Budding Yeast PRESENT     Physical Findings: AIMS: Facial and Oral Movements Muscles of Facial Expression: None, normal Lips and Perioral Area: None, normal Jaw: None, normal Tongue: None, normal,Extremity Movements Upper (arms, wrists, hands, fingers): None, normal Lower (legs, knees, ankles, toes): None, normal, Trunk Movements Neck, shoulders, hips: None, normal, Overall Severity Severity of abnormal movements (highest score from questions above): None, normal Incapacitation due to abnormal movements: None, normal Patient's awareness of abnormal movements (rate only patient's report): No Awareness, Dental Status Current problems with teeth and/or dentures?: No Does patient usually wear dentures?: No  CIWA:  CIWA-Ar Total: 0 COWS:     Musculoskeletal: Strength & Muscle Tone: within normal limits Gait & Station: normal Patient leans: N/A  Psychiatric Specialty Exam: Review of Systems  Neurological: Negative for tremors.  Psychiatric/Behavioral: Negative for depression, suicidal ideas, hallucinations, memory loss and substance abuse. The patient is nervous/anxious (surrounding taking her medicine and feeling it is necessary). The patient does not have insomnia.   All other systems reviewed and are  negative.   Blood pressure 117/68, pulse 88, temperature 98.2 F (36.8 C), temperature source Oral, resp. rate 20, height 5\' 1"  (1.549 m), weight 57.153 kg (126 lb).Body mass index is 23.82 kg/(m^2).  General Appearance: Fairly Groomed  Engineer, water::  Good  Speech:  Pressured  Volume:  Normal  Mood:  Okay  Affect:  Congruent  Thought Process:  Loose  Orientation:  Full (Time, Place, and Person)  Thought Content:  Delusions and Paranoid Ideation  Suicidal Thoughts:  No  Homicidal Thoughts:  No  Memory:  Immediate;   Fair Recent;   Fair Remote;   Fair  Judgement:  Poor  Insight:  Shallow  Psychomotor Activity:  Increased  Concentration:  Fair  Recall:  [place]: Fair  Akathisia:  No  Handed:  Right  AIMS (if indicated):     Assets:  Communication Skills Desire for Improvement Financial Resources/Insurance Housing Physical Health Resilience Social Support  ADL's:  Intact  Cognition: WNL  Sleep:  Number of Hours: 6.5   Treatment Plan Summary: Daily contact with patient to assess and evaluate symptoms and progress in treatment and Medication management   Paula Dickson is a 50 year old female with a history of bipolar illness transferred from medical floor of [facility] where she was hospitalized for polydipsia and low sodium to treat psychotic disorganization and aggressive behavior in the context of treatment noncompliance.  1. Agitated behavior. This has resolved.  2. Mood. She was started on Depakote and low-dose Seroquel at [facility]. We discontinued Depakote due to elevated ammonia level. She was restarted on the Lthium on January 15. She did well on Risperdal in the past but it's possible that now it causes tremors. We offered Seroquel instead. We will increase the dose again to 800 mg tonight. Lithium level 0.99 on 1/18.  3. Hypertension. She was on Norvasc.  4. Abnormal thyroid function tests. Repeated thyroid panel was normal.  5.  Hyponatremia. This has resolved.  6. Insomnia. She did not respond adequately to low dose clonazepam or trazodone. She slept almost 6 hours with 30 mg of Restoril.  7. STD. HIV, RPR and hepatitis panel were all negative  8. Metabolic syndrome. Lipid panel, hemoglobin A1c are not elevated.   9. Tremor. The patient was treated with propranolol in the past suggestive of familial benign tremor. We restarted propranolol with improvement.    10. Disposition. She will be discharged to home with her family on Monday morning to participate in disability court. She will follow up with [facility] in [place].   Faith Rogue 12/19/2015, 1:50 PM

## 2015-12-19 NOTE — BHH Group Notes (Signed)
[facility] Group Notes:  (Nursing/MHT/Case Management/Adjunct)  Date:  12/19/2015  Time:  12:05 AM  Type of Therapy:  Group Therapy  Participation Level:  Active  Participation Quality:  Appropriate  Affect:  Appropriate  Cognitive:  Appropriate  Insight:  Appropriate  Engagement in Group:  Engaged  Modes of Intervention:  Support  Summary of Progress/Problems:  Paula Dickson 12/19/2015, 12:05 AM

## 2015-12-20 MED ORDER — PROPRANOLOL HCL 20 MG PO TABS: 20.0000 mg | ORAL_TABLET | Freq: Three times a day (TID) | ORAL | Status: DC

## 2015-12-20 MED ORDER — FOSFOMYCIN TROMETHAMINE 3 G PO PACK
3.0000 g | PACK | Freq: Every day | ORAL | Status: DC
  Filled 2015-12-20: qty 3

## 2015-12-20 MED ORDER — AMLODIPINE BESYLATE 5 MG PO TABS: 5.0000 mg | ORAL_TABLET | Freq: Every day | ORAL | Status: DC

## 2015-12-20 MED ORDER — QUETIAPINE FUMARATE 400 MG PO TABS: 800.0000 mg | ORAL_TABLET | Freq: Every day | ORAL | Status: DC

## 2015-12-20 MED ORDER — LITHIUM CARBONATE ER 450 MG PO TBCR: 450.0000 mg | EXTENDED_RELEASE_TABLET | Freq: Two times a day (BID) | ORAL | Status: DC

## 2015-12-20 MED ORDER — TEMAZEPAM 30 MG PO CAPS: 30.0000 mg | ORAL_CAPSULE | Freq: Every evening | ORAL | Status: DC | PRN
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
## 2015-12-20 NOTE — Plan of Care (Signed)
Problem: Alteration in thought process Goal: STG-Patient is able to discuss thoughts with staff Outcome: Progressing Pt discusses thoughts towards discharge with staff

## 2015-12-20 NOTE — Tx Team (Signed)
Interdisciplinary Treatment Plan Update (Adult)  Date:  12/20/2015 Time Reviewed:  11:38 AM  Progress in Treatment: Attending groups: Yes. Participating in groups:  Yes. Taking medication as prescribed:  Yes. Tolerating medication:  Yes. Family/Significant othe contact made:  Yes, individual(s) contacted:  patient's sister Research officer, trade [employer] Patient understands diagnosis:  No. and As evidenced by:  patient is not able to state symptoms that brought her to  the hospital  Discussing patient identified problems/goals with staff:  Yes. Medical problems stabilized or resolved:  Yes. Denies suicidal/homicidal ideation: Yes. Issues/concerns per patient self-inventory:  Yes. Other:  New problem(s) identified: No, Describe:  none reported  Discharge Plan or Barriers:Patient will stabilize on medication and discharge home will outpatient follow up in place.  Reason for Continuation of Hospitalization: Aggression Mania Medication stabilization Other; describe paranoia  Comments:  Estimated length of stay: 0 days will discharge today Monday 12/20/15  New goal(s):  Review of initial/current patient goals per problem list:   1.  Goal(s):participate in aftercare planning  Met:  Yes  Target date:by discharge  As evidenced NM:MHWKGSU participation will participate in aftercare plan AEB aftercare provider and housing plan at discharge being identified  2.  Goal (s):decrease psychosis  Met:  Yes  Target date:by discharge  As evidenced PJ:SRPRXYV demonstrates decreased signs and symptoms of psychosis  3.  Goal(s):decrease agression  Met:  Yes  Target date:by discharge  As evidenced OP:FYTWKMQ demonstration no signs and symptoms of aggression   Attendees: Physician:  [name], MD 1/23/201711:38 AM  Nursing:   Kai Levins, RN 1/23/201711:38 AM  Other:  Carmell Austria, [place] 1/23/201711:38 AM  Other:   1/23/201711:38 AM  Other:   1/23/201711:38 AM  Other:   1/23/201711:38 AM  Other:  1/23/201711:38 AM  Other:  1/23/201711:38 AM  Other:  1/23/201711:38 AM  Other:  1/23/201711:38 AM  Other:  1/23/201711:38 AM  Other:   1/23/201711:38 AM   Scribe for Treatment Team:   Keene Breath, MSW, LCSWA [phone]  12/20/2015, 11:38 AM

## 2015-12-20 NOTE — Progress Notes (Signed)
  [name] Adult Case Management Discharge Plan :  Will you be returning to the same living situation after discharge:  Yes,  home with her father At discharge, do you have transportation home?: Yes,  patient's sister will pick up Do you have the ability to pay for your medications: Yes,  patient has [employer]  Release of information consent forms completed and in the chart;  Patient's signature needed at discharge.  Patient to Follow up at: Follow-up Information    Follow up with [facility].   Why:  Patient is referred to ACT services but will continue services with her current provider until ACT services are in place.   Contact information:   [id number] [place],  [address] Ph [phone] Fax [phone]       Follow up with [name]. Go on 12/22/2015.   Why:  For follow-up care appt on Wednesday 12/22/15 at 9:00am    Contact information:   [place], [place] Ph [phone] Fax [phone] (walk in hours M-F 8am-4pm)       Next level of care provider has access to [place] and Suicide Prevention discussed: Yes,  SPE discussed with patient and her sister Paula Dickson, [phone]   Have you used any form of tobacco in the last 30 days? (Cigarettes, Smokeless Tobacco, Cigars, and/or Pipes): No  Has patient been referred to the Quitline?: N/A patient is not a smoker  Patient has been referred for addiction treatment: [place], [place], MSW, LCSWA [phone] 12/20/2015, 11:43 AM

## 2015-12-20 NOTE — Progress Notes (Signed)
D: Pt is pleasant and cooperative this evening. Denies SI/HI/AVH at this time. Denies pain. Pt is focused on discharge in the morning. Pt continues to state that she believes her dose of seroquel is too high.  A: Emotional support and encouragement provided. Pt takes prescribed medications with encouragement. q15 minute safety checks maintained. R: Pt remains free from harm

## 2015-12-20 NOTE — Progress Notes (Signed)
Pt discharged home with sister. Pt will be going to disability hearing this morning. Pt belongings returned to pt upon discharge. Writer went over discharge packet and paperwork with pt and answered any questions she had. Pt denies SI/HI/AVH at this time. Denies pain. Safety is maintained.

## 2015-12-20 NOTE — BHH Suicide Risk Assessment (Signed)
[facility] Discharge Suicide Risk Assessment   Principal Problem: Bipolar I disorder, most recent episode manic, severe with psychotic features [facility]) Discharge Diagnoses:  Patient Active Problem List   Diagnosis Date Noted  . Hyperthyroidism [E05.90] 12/03/2015  . 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 Bipolar I disorder, most recent episode manic, severe with psychotic features (Paula Dickson) [F31.2] 06/14/2015    Total Time spent with patient: 30 minutes  Musculoskeletal: Strength & Muscle Tone: within normal limits Gait & Station: normal Patient leans: N/A  Psychiatric Specialty Exam: Review of Systems  All other systems reviewed and are negative.   Blood pressure 148/85, pulse 65, temperature 98.2 F (36.8 C), temperature source Oral, resp. rate 20, height 5\' 1"  (1.549 m), weight 57.153 kg (126 lb).Body mass index is 23.82 kg/(m^2).  General Appearance: Casual  Eye Contact::  Good  Speech:  Clear and Coherent409  Volume:  Normal  Mood:  Anxious  Affect:  Appropriate  Thought Process:  Disorganized  Orientation:  Full (Time, Place, and Person)  Thought Content:  Delusions and Paranoid Ideation  Suicidal Thoughts:  No  Homicidal Thoughts:  No  Memory:  Immediate;   Fair Recent;   Fair Remote;   Fair  Judgement:  Impaired  Insight:  Shallow  Psychomotor Activity:  Normal  Concentration:  Fair  Recall:  [place]  Language: Fair  Akathisia:  No  Handed:  Right  AIMS (if indicated):     Assets:  Communication Skills Desire for Improvement Financial Resources/Insurance Housing Physical Health Resilience Social Support  Sleep:  Number of Hours: 6.25  Cognition: WNL  ADL's:  Intact   Mental Status Per Nursing Assessment::   On Admission:  NA  Demographic Factors:  Unemployed  Loss Factors: Financial problems/change in socioeconomic status  Historical Factors: Family history of mental illness or substance abuse and Impulsivity  Risk  Reduction Factors:   Sense of responsibility to family, Religious beliefs about death, Living with another person, especially a relative, Positive social support and Positive therapeutic relationship  Continued Clinical Symptoms:  Bipolar Disorder:   Mixed State  Cognitive Features That Contribute To Risk:  None    Suicide Risk:  Minimal: No identifiable suicidal ideation.  Patients presenting with no risk factors but with morbid ruminations; may be classified as minimal risk based on the severity of the depressive symptoms  Follow-up Information    Follow up with [facility].   Why:  Patient is referred to ACT services but will continue services with her current provider until ACT services are in place.   Contact information:   [id number] [place], [place] [address] Ph [phone] Fax [phone]       Follow up with [name]. Go on 12/22/2015.   Why:  For follow-up care appt on Wednesday 12/22/15 at 9:00am    Contact information:   [place], [place] Ph [phone] Fax [phone] (walk in hours M-F 8am-4pm)       Plan Of Care/Follow-up recommendations:  Activity:  As tolerated. Diet:  Low sodium heart healthy. Other:  Keep follow-up appointments.  [name], MD 12/20/2015, 11:13 AM

## 2015-12-24 ENCOUNTER — Telehealth: Payer: Self-pay | Admitting: Family Medicine

## 2015-12-24 NOTE — Telephone Encounter (Signed)
Pt called asking to discuss lab work that was done at [facility] at time of her admission. She asked if she could be called back today regarding this. I did inform her that Dr. Deborra Medina was out of the office today, but I would send the message and someone would contact her, possibly as early as next week. She requests to get a call sooner than later. She said she already had the results, but she is needing to clarify the outcome of a couple particular ones. You can reach her at [phone].

## 2015-12-27 ENCOUNTER — Ambulatory Visit (INDEPENDENT_AMBULATORY_CARE_PROVIDER_SITE_OTHER): Payer: Medicare Other | Admitting: Family Medicine

## 2015-12-27 ENCOUNTER — Encounter: Payer: Self-pay | Admitting: Family Medicine

## 2015-12-27 VITALS — BP 122/70 | HR 62 | Temp 97.9°F | Ht 61.75 in | Wt 124.8 lb

## 2015-12-27 DIAGNOSIS — F312 Bipolar disorder, current episode manic severe with psychotic features: Secondary | ICD-10-CM

## 2015-12-27 DIAGNOSIS — G4700 Insomnia, unspecified: Secondary | ICD-10-CM | POA: Insufficient documentation

## 2015-12-27 DIAGNOSIS — R8290 Unspecified abnormal findings in urine: Secondary | ICD-10-CM | POA: Insufficient documentation

## 2015-12-27 DIAGNOSIS — F54 Psychological and behavioral factors associated with disorders or diseases classified elsewhere: Secondary | ICD-10-CM | POA: Diagnosis not present

## 2015-12-27 DIAGNOSIS — E0590 Thyrotoxicosis, unspecified without thyrotoxic crisis or storm: Secondary | ICD-10-CM | POA: Diagnosis not present

## 2015-12-27 DIAGNOSIS — R631 Polydipsia: Secondary | ICD-10-CM

## 2015-12-27 LAB — POC URINALSYSI DIPSTICK (AUTOMATED)
Bilirubin, UA: NEGATIVE
Blood, UA: POSITIVE
Glucose, UA: NEGATIVE
Ketones, UA: NEGATIVE
Nitrite, UA: NEGATIVE
Protein, UA: NEGATIVE
Spec Grav, UA: 1.015
Urobilinogen, UA: 0.2
pH, UA: 6

## 2015-12-27 LAB — COMPREHENSIVE METABOLIC PANEL
ALT: 23 U/L (ref 0–35)
AST: 17 U/L (ref 0–37)
Albumin: 4.1 g/dL (ref 3.5–5.2)
Alkaline Phosphatase: 49 U/L (ref 39–117)
BUN: 10 mg/dL (ref 6–23)
CO2: 28 mEq/L (ref 19–32)
Calcium: 10 mg/dL (ref 8.4–10.5)
Chloride: 102 mEq/L (ref 96–112)
Creatinine, Ser: 1.21 mg/dL — ABNORMAL HIGH (ref 0.40–1.20)
GFR: 60.11 mL/min (ref >60.00)
Glucose, Bld: 94 mg/dL (ref 70–99)
Potassium: 3.5 mEq/L (ref 3.5–5.1)
Sodium: 138 mEq/L (ref 135–145)
Total Bilirubin: 0.7 mg/dL (ref 0.2–1.2)
Total Protein: 7 g/dL (ref 6.0–8.3)

## 2015-12-27 LAB — T4, FREE: Free T4: 0.79 ng/dL (ref 0.60–1.60)

## 2015-12-27 LAB — TSH: TSH: 0.45 u[IU]/mL (ref 0.35–4.50)

## 2015-12-27 NOTE — Patient Instructions (Signed)
Great to see you. We will call you with your results from today.   Please make an appointment with your psychiatrist.

## 2015-12-27 NOTE — Progress Notes (Signed)
Pre visit review using our clinic review tool, if applicable. No additional management support is needed unless otherwise documented below in the visit note. 

## 2015-12-27 NOTE — Progress Notes (Signed)
Subjective:   Patient ID: Paula Dickson, female    DOB: 04/16/1966, 50 y.o.   MRN: [id number]  Paula Dickson is a pleasant 50 y.o. year old female who presents to clinic today with Hospitalization Follow-up  on 12/27/2015  HPI:  Admitted to behavorial health for psychiatric management after she was considered medically stable to be transferred from [facility]  Notes reviewed- hospital admission dates 1/12- 12/20/15.  Initially admitted to [facility] for aggressive behavior/severe manic episode and psychogenic polydypsia.  Was started on Depakote and low dose Seroquel at [facility] but Depakote was discontinued due to elevated ammonia levels.  She was tehn restarted on Lithium and Seroquel was continued. Lithium level 0.99 on 12/15/15.  Initially thyroid labs were off but they normalized prior to discharge as did her sodium levels.  Was told her urine sample was abnormal.  Wants that retested today. She denies any dysuria.  Has not had follow up with psychiatrist yet.  Has not been drinking more water.  Says her mood is now stable.  She has lost significant amount of weight but she is not concerned, stating "just eating better." Lab Results  Component Value Date   TSH 1.120 12/12/2015   Lab Results  Component Value Date   NA 139 12/12/2015   K 4.3 12/12/2015   CL 105 12/12/2015   CO2 28 12/12/2015   Insomnia- restoril was effective.  She did not have an adequate response to either clonazepam or trazodone.  Current Outpatient Prescriptions on File Prior to Visit  Medication Sig Dispense Refill  . amLODipine (NORVASC) 5 MG tablet Take 1 tablet (5 mg total) by mouth daily. 30 tablet 0  . lithium carbonate (ESKALITH) 450 MG CR tablet Take 1 tablet (450 mg total) by mouth every 12 (twelve) hours. 60 tablet 0  . propranolol (INDERAL) 20 MG tablet Take 1 tablet (20 mg total) by mouth 3 (three) times daily. 90 tablet 0  . temazepam (RESTORIL) 30 MG capsule Take 1 capsule (30 mg total) by mouth at  bedtime as needed for sleep. 30 capsule 0  . QUEtiapine (SEROQUEL) 400 MG tablet Take 2 tablets (800 mg total) by mouth at bedtime. (Patient not taking: Reported on 12/27/2015) 60 tablet 0   No current facility-administered medications on file prior to visit.    No Known Allergies  Past Medical History  Diagnosis Date  . History of arthritis   . History of chicken pox   . History of depression   . History of genital warts   . history of heart murmur   . History of high blood pressure   . History of thyroid disease   . History of UTI   . Bipolar affective disorder ([place])   . Hypertension   . Schizophrenia [facility])     Past Surgical History  Procedure Laterality Date  . Cyst removal neck      around 11 years ago /benign  . Multiple tooth extractions    . Ablation on endometriosis      Family History  Problem Relation Age of Onset  . Alcohol abuse Paternal Uncle   . Alcohol abuse Paternal Grandfather   . Arthritis Father   . Hyperlipidemia Father   . High blood pressure Father   . Breast cancer Maternal Aunt   . Breast cancer Paternal Aunt   . High blood pressure Sister   . Diabetes Sister   . Diabetes Mother   . Mental illness Other     runs in family  .  Diabetes Brother   . Mental illness Brother     Social History   Social History  . Marital Status: Single    Spouse Name: N/A  . Number of Children: N/A  . Years of Education: N/A   Occupational History  . Not on file.   Social History Main Topics  . Smoking status: Never Smoker   . Smokeless tobacco: Never Used  . Alcohol Use: No  . Drug Use: No  . Sexual Activity: Not Currently   Other Topics Concern  . Not on file   Social History Narrative   The PMH, PSH, Social History, Family History, Medications, and allergies have been reviewed in [facility], and have been updated if relevant.     Review of Systems  Eyes: Negative.   Respiratory: Negative.   Gastrointestinal: Negative.   Endocrine: Negative.    Genitourinary: Negative.   Musculoskeletal: Negative.   Skin: Negative.   Psychiatric/Behavioral: Negative.   All other systems reviewed and are negative.      Objective:    BP 122/70 mmHg  Pulse 62  Temp(Src) 97.9 F (36.6 C) (Oral)  Ht 5' 1.75" (1.568 m)  Wt 124 lb 12 oz (56.586 kg)  BMI 23.02 kg/m2  SpO2 97%   Physical Exam  Constitutional: She is oriented to person, place, and time. She appears well-developed and well-nourished. No distress.  HENT:  Head: Normocephalic.  Eyes: Conjunctivae are normal.  Cardiovascular: Normal rate.   Pulmonary/Chest: Effort normal.  Musculoskeletal: Normal range of motion.  Neurological: She is alert and oriented to person, place, and time. No cranial nerve deficit.  Skin: Skin is warm and dry. She is not diaphoretic.  Psychiatric: She has a normal mood and affect. Her behavior is normal. Judgment and thought content normal.  Nursing note and vitals reviewed.         Assessment & Plan:   Bipolar I disorder, most recent episode manic, severe with psychotic features ([place])  Psychogenic polydipsia  Hyperthyroidism  Insomnia No Follow-up on file.

## 2015-12-27 NOTE — Assessment & Plan Note (Signed)
3+ LE- send for cx prior to starting abx since she is asymptomatic.

## 2015-12-27 NOTE — Assessment & Plan Note (Signed)
Thyroid studies normal at discharge. Repeat today.

## 2015-12-27 NOTE — Assessment & Plan Note (Signed)
Recheck sodium today.  

## 2015-12-27 NOTE — Assessment & Plan Note (Signed)
Mood stable at this point. Continue current rxs. Advised to call her psychiatrist ASAP to make a follow up appointment.

## 2015-12-28 ENCOUNTER — Encounter: Payer: Self-pay | Admitting: *Deleted

## 2015-12-28 LAB — URINE CULTURE: Colony Count: 50000

## 2016-01-03 ENCOUNTER — Telehealth: Payer: Self-pay | Admitting: Family Medicine

## 2016-01-03 NOTE — Telephone Encounter (Signed)
See results note. 

## 2016-01-03 NOTE — Telephone Encounter (Signed)
Pt would like to know about blood ua and urine culture  cb number is [phone] Thanks

## 2016-01-04 ENCOUNTER — Telehealth: Payer: Self-pay | Admitting: Family Medicine

## 2016-01-04 NOTE — Telephone Encounter (Signed)
Patient is asking for [facility] to call her back about her lab results.  She has some questions.

## 2016-01-05 NOTE — Telephone Encounter (Signed)
Spoke to pt and reviewed lab results.

## 2016-01-06 ENCOUNTER — Telehealth: Payer: Self-pay

## 2016-01-06 DIAGNOSIS — R8290 Unspecified abnormal findings in urine: Secondary | ICD-10-CM

## 2016-01-06 NOTE — Telephone Encounter (Signed)
Pt left v/m; pt wants to repeat urine culture since the 12/27/15 urine culture was inconclusive. Pt would like to do at [facility] lab on 01/07/16. Pt request cb.

## 2016-01-06 NOTE — Telephone Encounter (Signed)
Ok to repeat urine cx as requeststed.

## 2016-01-07 ENCOUNTER — Telehealth: Payer: Self-pay

## 2016-01-07 ENCOUNTER — Other Ambulatory Visit (INDEPENDENT_AMBULATORY_CARE_PROVIDER_SITE_OTHER): Payer: BLUE CROSS/BLUE SHIELD

## 2016-01-07 DIAGNOSIS — R8290 Unspecified abnormal findings in urine: Secondary | ICD-10-CM

## 2016-01-07 NOTE — Telephone Encounter (Signed)
Pt came in and left a urine sample for culture

## 2016-01-07 NOTE — Telephone Encounter (Signed)
Pt was seen 12/27/15 for hospital f/u by Dr Deborra Medina.

## 2016-01-07 NOTE — Telephone Encounter (Signed)
Ok to add as requested

## 2016-01-07 NOTE — Telephone Encounter (Signed)
Pt left v/m; recently had several labs done and pt would like to have HIV testing on 01/10/16. Pt request cb.

## 2016-01-08 LAB — URINE CULTURE: Colony Count: 70000

## 2016-01-14 ENCOUNTER — Other Ambulatory Visit: Payer: Self-pay | Admitting: Psychiatry

## 2016-01-17 ENCOUNTER — Other Ambulatory Visit: Payer: Self-pay | Admitting: Psychiatry

## 2016-01-18 ENCOUNTER — Other Ambulatory Visit: Payer: Self-pay | Admitting: Family Medicine

## 2016-02-13 ENCOUNTER — Other Ambulatory Visit: Payer: Self-pay | Admitting: Family Medicine

## 2016-02-24 ENCOUNTER — Other Ambulatory Visit: Payer: Self-pay

## 2016-02-24 DIAGNOSIS — Z1231 Encounter for screening mammogram for malignant neoplasm of breast: Secondary | ICD-10-CM

## 2016-03-20 ENCOUNTER — Ambulatory Visit
Admission: RE | Admit: 2016-03-20 | Discharge: 2016-03-20 | Disposition: A | Discharge status: Home / Self Care | Payer: No Typology Code available for payment source | Source: Ambulatory Visit

## 2016-03-20 DIAGNOSIS — Z1231 Encounter for screening mammogram for malignant neoplasm of breast: Secondary | ICD-10-CM

## 2016-05-11 DIAGNOSIS — F250 Schizoaffective disorder, bipolar type: Secondary | ICD-10-CM | POA: Diagnosis not present

## 2016-06-13 DIAGNOSIS — I10 Essential (primary) hypertension: Secondary | ICD-10-CM | POA: Diagnosis not present

## 2016-06-29 DIAGNOSIS — F250 Schizoaffective disorder, bipolar type: Secondary | ICD-10-CM | POA: Diagnosis not present

## 2016-07-13 DIAGNOSIS — F250 Schizoaffective disorder, bipolar type: Secondary | ICD-10-CM | POA: Diagnosis not present

## 2016-07-13 DIAGNOSIS — Z79899 Other long term (current) drug therapy: Secondary | ICD-10-CM | POA: Diagnosis not present

## 2016-07-13 DIAGNOSIS — I10 Essential (primary) hypertension: Secondary | ICD-10-CM | POA: Diagnosis not present

## 2016-07-18 DIAGNOSIS — F250 Schizoaffective disorder, bipolar type: Secondary | ICD-10-CM | POA: Diagnosis not present

## 2016-07-23 DIAGNOSIS — Z23 Encounter for immunization: Secondary | ICD-10-CM | POA: Diagnosis not present

## 2016-07-28 DIAGNOSIS — I10 Essential (primary) hypertension: Secondary | ICD-10-CM | POA: Diagnosis not present

## 2016-08-01 DIAGNOSIS — F250 Schizoaffective disorder, bipolar type: Secondary | ICD-10-CM | POA: Diagnosis not present

## 2016-08-03 DIAGNOSIS — L601 Onycholysis: Secondary | ICD-10-CM | POA: Diagnosis not present

## 2016-08-10 DIAGNOSIS — F250 Schizoaffective disorder, bipolar type: Secondary | ICD-10-CM | POA: Diagnosis not present

## 2016-08-10 DIAGNOSIS — F312 Bipolar disorder, current episode manic severe with psychotic features: Secondary | ICD-10-CM | POA: Diagnosis not present

## 2016-08-14 DIAGNOSIS — E7800 Pure hypercholesterolemia, unspecified: Secondary | ICD-10-CM | POA: Diagnosis not present

## 2016-08-14 DIAGNOSIS — E1165 Type 2 diabetes mellitus with hyperglycemia: Secondary | ICD-10-CM | POA: Diagnosis not present

## 2016-08-14 DIAGNOSIS — Z79899 Other long term (current) drug therapy: Secondary | ICD-10-CM | POA: Diagnosis not present

## 2016-08-14 DIAGNOSIS — I10 Essential (primary) hypertension: Secondary | ICD-10-CM | POA: Diagnosis not present

## 2016-08-14 DIAGNOSIS — E039 Hypothyroidism, unspecified: Secondary | ICD-10-CM | POA: Diagnosis not present

## 2016-08-14 DIAGNOSIS — F319 Bipolar disorder, unspecified: Secondary | ICD-10-CM | POA: Diagnosis not present

## 2016-08-14 DIAGNOSIS — R5383 Other fatigue: Secondary | ICD-10-CM | POA: Diagnosis not present

## 2016-08-16 DIAGNOSIS — F250 Schizoaffective disorder, bipolar type: Secondary | ICD-10-CM | POA: Diagnosis not present

## 2016-08-16 DIAGNOSIS — F312 Bipolar disorder, current episode manic severe with psychotic features: Secondary | ICD-10-CM | POA: Diagnosis not present

## 2016-08-23 DIAGNOSIS — F312 Bipolar disorder, current episode manic severe with psychotic features: Secondary | ICD-10-CM | POA: Diagnosis not present

## 2016-08-23 DIAGNOSIS — F250 Schizoaffective disorder, bipolar type: Secondary | ICD-10-CM | POA: Diagnosis not present

## 2016-09-12 DIAGNOSIS — F250 Schizoaffective disorder, bipolar type: Secondary | ICD-10-CM | POA: Diagnosis not present

## 2016-09-13 DIAGNOSIS — F319 Bipolar disorder, unspecified: Secondary | ICD-10-CM | POA: Diagnosis not present

## 2016-09-13 DIAGNOSIS — I10 Essential (primary) hypertension: Secondary | ICD-10-CM | POA: Diagnosis not present

## 2016-09-13 DIAGNOSIS — E7800 Pure hypercholesterolemia, unspecified: Secondary | ICD-10-CM | POA: Diagnosis not present

## 2016-09-19 DIAGNOSIS — F312 Bipolar disorder, current episode manic severe with psychotic features: Secondary | ICD-10-CM | POA: Diagnosis not present

## 2016-09-19 DIAGNOSIS — F250 Schizoaffective disorder, bipolar type: Secondary | ICD-10-CM | POA: Diagnosis not present

## 2016-09-21 DIAGNOSIS — F250 Schizoaffective disorder, bipolar type: Secondary | ICD-10-CM | POA: Diagnosis not present

## 2016-09-21 DIAGNOSIS — F312 Bipolar disorder, current episode manic severe with psychotic features: Secondary | ICD-10-CM | POA: Diagnosis not present

## 2016-09-27 DIAGNOSIS — L810 Postinflammatory hyperpigmentation: Secondary | ICD-10-CM | POA: Diagnosis not present

## 2016-11-16 DIAGNOSIS — F250 Schizoaffective disorder, bipolar type: Secondary | ICD-10-CM | POA: Diagnosis not present

## 2016-11-16 DIAGNOSIS — F312 Bipolar disorder, current episode manic severe with psychotic features: Secondary | ICD-10-CM | POA: Diagnosis not present

## 2016-11-23 DIAGNOSIS — F250 Schizoaffective disorder, bipolar type: Secondary | ICD-10-CM | POA: Diagnosis not present

## 2016-11-23 DIAGNOSIS — F312 Bipolar disorder, current episode manic severe with psychotic features: Secondary | ICD-10-CM | POA: Diagnosis not present

## 2016-12-21 DIAGNOSIS — F312 Bipolar disorder, current episode manic severe with psychotic features: Secondary | ICD-10-CM | POA: Diagnosis not present

## 2016-12-21 DIAGNOSIS — F250 Schizoaffective disorder, bipolar type: Secondary | ICD-10-CM | POA: Diagnosis not present

## 2016-12-26 DIAGNOSIS — E7800 Pure hypercholesterolemia, unspecified: Secondary | ICD-10-CM | POA: Diagnosis not present

## 2016-12-26 DIAGNOSIS — R739 Hyperglycemia, unspecified: Secondary | ICD-10-CM | POA: Diagnosis not present

## 2016-12-26 DIAGNOSIS — Z79899 Other long term (current) drug therapy: Secondary | ICD-10-CM | POA: Diagnosis not present

## 2016-12-26 DIAGNOSIS — I10 Essential (primary) hypertension: Secondary | ICD-10-CM | POA: Diagnosis not present

## 2016-12-29 DIAGNOSIS — L720 Epidermal cyst: Secondary | ICD-10-CM | POA: Diagnosis not present

## 2017-01-03 DIAGNOSIS — L720 Epidermal cyst: Secondary | ICD-10-CM | POA: Diagnosis not present

## 2017-01-11 DIAGNOSIS — L720 Epidermal cyst: Secondary | ICD-10-CM | POA: Diagnosis not present

## 2017-01-18 DIAGNOSIS — F312 Bipolar disorder, current episode manic severe with psychotic features: Secondary | ICD-10-CM | POA: Diagnosis not present

## 2017-01-18 DIAGNOSIS — F250 Schizoaffective disorder, bipolar type: Secondary | ICD-10-CM | POA: Diagnosis not present

## 2017-02-08 DIAGNOSIS — F250 Schizoaffective disorder, bipolar type: Secondary | ICD-10-CM | POA: Diagnosis not present

## 2017-02-13 ENCOUNTER — Emergency Department
Admission: EM | Admit: 2017-02-13 | Discharge: 2017-02-13 | Disposition: A | Discharge status: Home / Self Care | Payer: Medicare Other | Attending: Emergency Medicine | Admitting: Emergency Medicine

## 2017-02-13 ENCOUNTER — Encounter: Payer: Self-pay | Admitting: Emergency Medicine

## 2017-02-13 ENCOUNTER — Emergency Department: Payer: Medicare Other

## 2017-02-13 DIAGNOSIS — Z79899 Other long term (current) drug therapy: Secondary | ICD-10-CM | POA: Diagnosis not present

## 2017-02-13 DIAGNOSIS — R079 Chest pain, unspecified: Secondary | ICD-10-CM | POA: Diagnosis not present

## 2017-02-13 DIAGNOSIS — R0789 Other chest pain: Secondary | ICD-10-CM | POA: Diagnosis not present

## 2017-02-13 LAB — BASIC METABOLIC PANEL
Anion gap: 6 (ref 5–15)
BUN: 13 mg/dL (ref 6–20)
CO2: 26 mmol/L (ref 22–32)
Calcium: 10.3 mg/dL (ref 8.9–10.3)
Chloride: 104 mmol/L (ref 101–111)
Creatinine, Ser: 0.98 mg/dL (ref 0.44–1.00)
GFR calc Af Amer: >60 mL/min (ref >60)
GFR calc non Af Amer: >60 mL/min (ref >60)
Glucose, Bld: 108 mg/dL — ABNORMAL HIGH (ref 65–99)
Potassium: 3.6 mmol/L (ref 3.5–5.1)
Sodium: 136 mmol/L (ref 135–145)

## 2017-02-13 LAB — CBC
HCT: 39.2 % (ref 35.0–47.0)
Hemoglobin: 13.4 g/dL (ref 12.0–16.0)
MCH: 29.4 pg (ref 26.0–34.0)
MCHC: 34.3 g/dL (ref 32.0–36.0)
MCV: 85.8 fL (ref 80.0–100.0)
Platelets: 216 10*3/uL (ref 150–440)
RBC: 4.56 MIL/uL (ref 3.80–5.20)
RDW: 13.4 % (ref 11.5–14.5)
WBC: 5.9 10*3/uL (ref 3.6–11.0)

## 2017-02-13 LAB — TROPONIN I
Troponin I: <0.03 ng/mL (ref <0.03)
Troponin I: <0.03 ng/mL (ref <0.03)

## 2017-02-13 MED ORDER — ASPIRIN 81 MG PO CHEW
324.0000 mg | CHEWABLE_TABLET | Freq: Once | ORAL | Status: AC
  Administered 2017-02-13: 324 mg via ORAL
  Filled 2017-02-13: qty 4

## 2017-02-13 NOTE — ED Triage Notes (Signed)
Pt to ed with c/o chest pain intermittent over the last month, states currently having it at this time. EKG done and shown to DR Mariea Clonts.

## 2017-02-13 NOTE — ED Provider Notes (Signed)
[facility] Emergency Department Provider Note  ____________________________________________  Time seen: Approximately 1:30 PM  I have reviewed the triage vital signs and the nursing notes.   HISTORY  Chief Complaint Chest Pain   HPI Paula Dickson is a 51 y.o. female a history of schizophrenia and hypertension who presents for evaluation of chest pain. Patient reports that she has been having on and off chest pain over the course of the last month. Today she had signed up for the health center here and while she was here she decided to get checked out. She denies having any chest pain today or yesterday. She reports that she's been having once a week or less episodes of chest pain both at rest and with exertion that she describes as a pressure in the center of her chest nonradiating lasting 10-15 seconds. She denies dizziness, diaphoresis, nausea, shortness of breath, vomiting associated with these episodes. She reports history of ischemic heart disease in her siblings. She has never had a stress test or seen a cardiologist.Patient reports that when they placed the leads for an EKG in her chest but that felt uncomfortable but denies chest pain. She is at her baseline at this time. She denies URI symptoms. She does not have a history of smoking.  Past Medical History:  Diagnosis Date  . Bipolar affective disorder ([place])   . History of arthritis   . History of chicken pox   . History of depression   . History of genital warts   . history of heart murmur   . History of high blood pressure   . History of thyroid disease   . History of UTI   . Hypertension   . Schizophrenia [facility])     Patient Active Problem List   Diagnosis Date Noted  . Insomnia 12/27/2015  . Abnormal urinalysis 12/27/2015  . Hyperthyroidism 12/03/2015  . 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 Bipolar I disorder, most recent episode manic, severe with psychotic features ([place]) 06/14/2015     Past Surgical History:  Procedure Laterality Date  . ABLATION ON ENDOMETRIOSIS    . CYST REMOVAL NECK     around 11 years ago /benign  . MULTIPLE TOOTH EXTRACTIONS      Prior to Admission medications   Medication Sig Start Date End Date Taking? Authorizing Provider  amLODipine (NORVASC) 5 MG tablet TAKE 1 TABLET BY MOUTH EVERY DAY 02/14/16  Yes Lucille Passy, MD  benztropine (COGENTIN) 0.5 MG tablet Take 0.5 mg by mouth at bedtime.   Yes Historical Provider, MD  Biotin 1000 MCG CHEW Chew 1 capsule by mouth daily.   Yes Historical Provider, MD  lithium carbonate (ESKALITH) 450 MG CR tablet Take 1 tablet (450 mg total) by mouth every 12 (twelve) hours. 12/20/15  Yes Jolanta B Pucilowska, MD  Multiple Vitamins-Minerals ([employer] SPECTRAVITE ADULT 50+ PO) Take 1 tablet by mouth daily.   Yes Historical Provider, MD  omega-3 acid ethyl esters (LOVAZA) 1 g capsule Take 1 g by mouth 3 (three) times daily.   Yes Historical Provider, MD  propranolol (INDERAL) 20 MG tablet Take 1 tablet (20 mg total) by mouth 3 (three) times daily. Patient taking differently: Take 10 mg by mouth 3 (three) times daily.  12/20/15  Yes Jolanta B Pucilowska, MD  QUEtiapine (SEROQUEL XR) 300 MG 24 hr tablet Take 600 mg by mouth at bedtime.   Yes Historical Provider, MD  QUEtiapine (SEROQUEL) 400 MG tablet Take 2 tablets (800 mg total) by  mouth at bedtime. Patient not taking: Reported on 12/27/2015 12/20/15   Clovis Fredrickson, MD  temazepam (RESTORIL) 30 MG capsule Take 1 capsule (30 mg total) by mouth at bedtime as needed for sleep. Patient not taking: Reported on 02/13/2017 12/20/15   Clovis Fredrickson, MD    Allergies Patient has no known allergies.  Family History  Problem Relation Age of Onset  . Arthritis Father   . Hyperlipidemia Father   . High blood pressure Father   . Diabetes Sister   . Diabetes Mother   . Diabetes Brother   . Mental illness Brother   . Alcohol abuse Paternal Uncle   . Alcohol abuse  Paternal Grandfather   . Breast cancer Maternal Aunt   . Breast cancer Paternal Aunt   . High blood pressure Sister   . Mental illness Other     runs in family    Social History Social History  Substance Use Topics  . Smoking status: Never Smoker  . Smokeless tobacco: Never Used  . Alcohol use No    Review of Systems  Constitutional: Negative for fever. Eyes: Negative for visual changes. ENT: Negative for sore throat. Neck: No neck pain  Cardiovascular: + chest pain. Respiratory: Negative for shortness of breath. Gastrointestinal: Negative for abdominal pain, vomiting or diarrhea. Genitourinary: Negative for dysuria. Musculoskeletal: Negative for back pain. Skin: Negative for rash. Neurological: Negative for headaches, weakness or numbness. Psych: No SI or HI  ____________________________________________   PHYSICAL EXAM:  VITAL SIGNS: ED Triage Vitals [02/13/17 1127]  Enc Vitals Group     BP 127/86     Pulse Rate 83     Resp 18     Temp 98.2 F (36.8 C)     Temp src      SpO2 95 %     Weight 176 lb (79.8 kg)     Height 5\' 1"  (1.549 m)     Head Circumference      Peak Flow      Pain Score 8     Pain Loc      Pain Edu?      Excl. in [place]?     Constitutional: Alert and oriented. Well appearing and in no apparent distress. HEENT:      Head: Normocephalic and atraumatic.         Eyes: Conjunctivae are normal. Sclera is non-icteric. EOMI. PERRL      Mouth/Throat: Mucous membranes are moist.       Neck: Supple with no signs of meningismus. Cardiovascular: Regular rate and rhythm. No murmurs, gallops, or rubs. 2+ symmetrical distal pulses are present in all extremities. No JVD. Respiratory: Normal respiratory effort. Lungs are clear to auscultation bilaterally. No wheezes, crackles, or rhonchi.  Gastrointestinal: Soft, non tender, and non distended with positive bowel sounds. No rebound or guarding. Genitourinary: No CVA tenderness. Musculoskeletal: Nontender  with normal range of motion in all extremities. No edema, cyanosis, or erythema of extremities. Neurologic: Normal speech and language. Face is symmetric. Moving all extremities. No gross focal neurologic deficits are appreciated. Skin: Skin is warm, dry and intact. No rash noted. Psychiatric: Mood and affect are normal. Speech and behavior are normal.  ____________________________________________   LABS (all labs ordered are listed, but only abnormal results are displayed)  Labs Reviewed  BASIC METABOLIC PANEL - Abnormal; Notable for the following:       Result Value   Glucose, Bld 108 (*)    All other components within normal limits  CBC  TROPONIN I  TROPONIN I   ____________________________________________  EKG  ED ECG REPORT I, Rudene Re, the attending physician, personally viewed and interpreted this ECG.  Normal sinus rhythm, LVH, rate of 79, first-degree AV block, normal axis, unchanged ST elevations in one and aVL, unchanged T-wave inversions in V3 to V6 and inferior leads. PR elevation on AVR. All of these changes have been present in old EKGs. ____________________________________________  RADIOLOGY  CXR: Negative ____________________________________________   PROCEDURES  Procedure(s) performed: None Procedures Critical Care performed:  None ____________________________________________   INITIAL IMPRESSION / ASSESSMENT AND PLAN / ED COURSE  51 y.o. female a history of schizophrenia and hypertension who presents for evaluation of atypical intermittent chest pain x 1 month.  Chest pain in a 51 y.o. female with low suspicion for cardiac (HEART score 3) or other serious etiology (including aortic dissection, pneumonia, pneumothorax, or pulmonary embolism) based her history and physical exam in the ED today. EKG normal. Plan for labs including CBC, chemistries and troponin now and in 3 hours, CXR and re-evaluation for disposition. Will give full dose ASA.  Will observe patient on cardiac monitor while in the ED.    Clinical Course as of Feb 13 1500  Tue Feb 13, 2017  1500 Patient remains chest pain-free. Second troponin is negative. We'll discharge home with follow-up with cardiology.  [CV]    Clinical Course User Index [CV] Rudene Re, MD    Pertinent labs & imaging results that were available during my care of the patient were reviewed by me and considered in my medical decision making (see chart for details).    ____________________________________________   FINAL CLINICAL IMPRESSION(S) / ED DIAGNOSES  Final diagnoses:  Chest pain, unspecified type      NEW MEDICATIONS STARTED DURING THIS VISIT:  New Prescriptions   No medications on file     Note:  This document was prepared using Dragon voice recognition software and may include unintentional dictation errors.    Rudene Re, MD 02/13/17 1501

## 2017-02-13 NOTE — Discharge Instructions (Signed)

## 2017-02-13 NOTE — ED Notes (Signed)
Patient transported to radiology

## 2017-02-13 NOTE — ED Notes (Signed)
Pt asking about taking her 2p medications to stay on regular schedule. Informed that this rn will ask for pt about that.

## 2017-02-14 ENCOUNTER — Other Ambulatory Visit: Payer: Self-pay | Admitting: Internal Medicine

## 2017-02-14 DIAGNOSIS — Z1231 Encounter for screening mammogram for malignant neoplasm of breast: Secondary | ICD-10-CM

## 2017-02-21 DIAGNOSIS — E782 Mixed hyperlipidemia: Secondary | ICD-10-CM | POA: Diagnosis not present

## 2017-02-21 DIAGNOSIS — I208 Other forms of angina pectoris: Secondary | ICD-10-CM | POA: Diagnosis not present

## 2017-02-21 DIAGNOSIS — I10 Essential (primary) hypertension: Secondary | ICD-10-CM | POA: Diagnosis not present

## 2017-02-22 DIAGNOSIS — L0291 Cutaneous abscess, unspecified: Secondary | ICD-10-CM | POA: Diagnosis not present

## 2017-02-22 DIAGNOSIS — B9689 Other specified bacterial agents as the cause of diseases classified elsewhere: Secondary | ICD-10-CM | POA: Diagnosis not present

## 2017-03-07 DIAGNOSIS — L720 Epidermal cyst: Secondary | ICD-10-CM | POA: Diagnosis not present

## 2017-03-08 ENCOUNTER — Telehealth: Payer: Self-pay | Admitting: Family Medicine

## 2017-03-08 DIAGNOSIS — F250 Schizoaffective disorder, bipolar type: Secondary | ICD-10-CM | POA: Diagnosis not present

## 2017-03-08 NOTE — Telephone Encounter (Signed)
Left pt message asking to call Allison back directly at [phone] to schedule AWV.+ labs with Lesia and CPE with PCP. °

## 2017-03-13 ENCOUNTER — Encounter: Payer: Self-pay | Admitting: Gastroenterology

## 2017-03-14 NOTE — Telephone Encounter (Signed)
Spoke to pt. She has moved her care to [facility].  Dr. Deborra Medina, pt wanted me to Thank you for her and all the help you gave her.   Dr. Deborra Medina removed as PCP in Epic

## 2017-03-14 NOTE — Telephone Encounter (Signed)
Thank you so much for sharing this message with me.

## 2017-03-24 ENCOUNTER — Encounter (HOSPITAL_COMMUNITY): Payer: Self-pay | Admitting: Emergency Medicine

## 2017-03-24 ENCOUNTER — Emergency Department (HOSPITAL_COMMUNITY)
Admission: EM | Admit: 2017-03-24 | Discharge: 2017-03-30 | Disposition: A | Discharge status: Home / Self Care | Payer: Medicare Other | Attending: Emergency Medicine | Admitting: Emergency Medicine

## 2017-03-24 DIAGNOSIS — F22 Delusional disorders: Secondary | ICD-10-CM

## 2017-03-24 DIAGNOSIS — Z811 Family history of alcohol abuse and dependence: Secondary | ICD-10-CM | POA: Diagnosis not present

## 2017-03-24 DIAGNOSIS — F312 Bipolar disorder, current episode manic severe with psychotic features: Secondary | ICD-10-CM | POA: Diagnosis not present

## 2017-03-24 DIAGNOSIS — Z818 Family history of other mental and behavioral disorders: Secondary | ICD-10-CM | POA: Diagnosis not present

## 2017-03-24 DIAGNOSIS — F315 Bipolar disorder, current episode depressed, severe, with psychotic features: Secondary | ICD-10-CM | POA: Diagnosis not present

## 2017-03-24 DIAGNOSIS — R451 Restlessness and agitation: Secondary | ICD-10-CM | POA: Diagnosis present

## 2017-03-24 DIAGNOSIS — R45851 Suicidal ideations: Secondary | ICD-10-CM | POA: Diagnosis not present

## 2017-03-24 DIAGNOSIS — Z79899 Other long term (current) drug therapy: Secondary | ICD-10-CM | POA: Insufficient documentation

## 2017-03-24 LAB — CBC WITH DIFFERENTIAL/PLATELET
Basophils Absolute: 0 10*3/uL (ref 0.0–0.1)
Basophils Relative: 0 %
Eosinophils Absolute: 0 10*3/uL (ref 0.0–0.7)
Eosinophils Relative: 0 %
HCT: 44.8 % (ref 36.0–46.0)
Hemoglobin: 15 g/dL (ref 12.0–15.0)
Lymphocytes Relative: 18 %
Lymphs Abs: 2.7 10*3/uL (ref 0.7–4.0)
MCH: 29.2 pg (ref 26.0–34.0)
MCHC: 33.5 g/dL (ref 30.0–36.0)
MCV: 87.2 fL (ref 78.0–100.0)
Monocytes Absolute: 1.3 10*3/uL — ABNORMAL HIGH (ref 0.1–1.0)
Monocytes Relative: 9 %
Neutro Abs: 11 10*3/uL — ABNORMAL HIGH (ref 1.7–7.7)
Neutrophils Relative %: 73 %
Platelets: 280 10*3/uL (ref 150–400)
RBC: 5.14 MIL/uL — ABNORMAL HIGH (ref 3.87–5.11)
RDW: 13.4 % (ref 11.5–15.5)
WBC: 15 10*3/uL — ABNORMAL HIGH (ref 4.0–10.5)

## 2017-03-24 LAB — ETHANOL: Alcohol, Ethyl (B): <5 mg/dL (ref <5)

## 2017-03-24 LAB — BASIC METABOLIC PANEL
Anion gap: 11 (ref 5–15)
BUN: 14 mg/dL (ref 6–20)
CO2: 24 mmol/L (ref 22–32)
Calcium: 10.5 mg/dL — ABNORMAL HIGH (ref 8.9–10.3)
Chloride: 108 mmol/L (ref 101–111)
Creatinine, Ser: 1.17 mg/dL — ABNORMAL HIGH (ref 0.44–1.00)
GFR calc Af Amer: >60 mL/min (ref >60)
GFR calc non Af Amer: 52 mL/min — ABNORMAL LOW (ref >60)
Glucose, Bld: 131 mg/dL — ABNORMAL HIGH (ref 65–99)
Potassium: 3.8 mmol/L (ref 3.5–5.1)
Sodium: 143 mmol/L (ref 135–145)

## 2017-03-24 MED ORDER — LORAZEPAM 2 MG/ML IJ SOLN
1.0000 mg | Freq: Once | INTRAMUSCULAR | Status: AC
  Administered 2017-03-24: 1 mg via INTRAMUSCULAR
  Filled 2017-03-24: qty 1

## 2017-03-24 MED ORDER — ZIPRASIDONE MESYLATE 20 MG IM SOLR
20.0000 mg | Freq: Once | INTRAMUSCULAR | Status: AC
  Administered 2017-03-24: 20 mg via INTRAMUSCULAR
  Filled 2017-03-24: qty 20

## 2017-03-24 MED ORDER — STERILE WATER FOR INJECTION IJ SOLN
INTRAMUSCULAR | Status: AC
  Administered 2017-03-24: 10 mL
  Filled 2017-03-24: qty 10

## 2017-03-24 NOTE — ED Notes (Signed)
Attempted to help pt use bedpan as Publishing rights manager states he cannot take off cuffs at this time.  Pt refuses to let staff put her on bedpan at this time. Informed Alecia Lemming about pts refusal.

## 2017-03-24 NOTE — ED Provider Notes (Signed)
[facility] DEPT Provider Note   CSN: [id number] Arrival date & time: 03/24/17  2004  By signing my name below, I, Paula Dickson, attest that this documentation has been prepared under the direction and in the presence of [employer], PA-C. Electronically Signed: Levester Dickson, Scribe. 03/24/2017. 8:22 PM.  History   Chief Complaint Chief Complaint  Patient presents with  . Suicidal    IVC    Paula Dickson is a 51 y.o. female with a PMHx consisting of bipolar disorder who presents to the Emergency Department at the request of her family members, who are not present during initial evaluation. Per police at bedside, pt has been medication non-complaint with her antipsychotics for approximately 2 weeks.   Pt herself is handcuffed to the bed and refusing to answer questions.  LEVEL 5 CAVEAT DUE TO MENTAL CONDITION OF PATIENT.   The history is provided by the police. No language interpreter was used.   Past Medical History:  Diagnosis Date  . Bipolar affective disorder ([place])   . History of arthritis   . History of chicken pox   . History of depression   . History of genital warts   . history of heart murmur   . History of high blood pressure   . History of thyroid disease   . History of UTI   . Hypertension   . Schizophrenia [facility])     Patient Active Problem List   Diagnosis Date Noted  . Insomnia 12/27/2015  . Abnormal urinalysis 12/27/2015  . Hyperthyroidism 12/03/2015  . 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 Bipolar I disorder, most recent episode manic, severe with psychotic features ([place]) 06/14/2015    Past Surgical History:  Procedure Laterality Date  . ABLATION ON ENDOMETRIOSIS    . CYST REMOVAL NECK     around 11 years ago /benign  . MULTIPLE TOOTH EXTRACTIONS      OB History    No data available     Home Medications    Prior to Admission medications   Medication Sig Start Date End Date Taking? Authorizing Provider  amLODipine (NORVASC) 5  MG tablet TAKE 1 TABLET BY MOUTH EVERY DAY Patient taking differently: TAKE 5 MG BY MOUTH EVERY DAY 02/14/16  Yes Lucille Passy, MD  benztropine (COGENTIN) 0.5 MG tablet Take 0.5 mg by mouth at bedtime.   Yes Historical Provider, MD  Biotin 1000 MCG CHEW Chew 1 capsule by mouth daily.   Yes Historical Provider, MD  lithium carbonate (ESKALITH) 450 MG CR tablet Take 1 tablet (450 mg total) by mouth every 12 (twelve) hours. 12/20/15  Yes Jolanta B Pucilowska, MD  Multiple Vitamins-Minerals ([employer] SPECTRAVITE ADULT 50+ PO) Take 1 tablet by mouth daily.   Yes Historical Provider, MD  omega-3 acid ethyl esters (LOVAZA) 1 g capsule Take 1 g by mouth 3 (three) times daily.   Yes Historical Provider, MD  propranolol (INDERAL) 20 MG tablet Take 1 tablet (20 mg total) by mouth 3 (three) times daily. Patient taking differently: Take 10 mg by mouth 3 (three) times daily.  12/20/15  Yes Jolanta B Pucilowska, MD  QUEtiapine (SEROQUEL XR) 300 MG 24 hr tablet Take 600 mg by mouth at bedtime.   Yes Historical Provider, MD  QUEtiapine (SEROQUEL) 400 MG tablet Take 2 tablets (800 mg total) by mouth at bedtime. Patient not taking: Reported on 03/24/2017 12/20/15   Clovis Fredrickson, MD  temazepam (RESTORIL) 30 MG capsule Take 1 capsule (30 mg total) by mouth at  bedtime as needed for sleep. Patient not taking: Reported on 02/13/2017 12/20/15   Clovis Fredrickson, MD   Family History Family History  Problem Relation Age of Onset  . Arthritis Father   . Hyperlipidemia Father   . High blood pressure Father   . Diabetes Sister   . Diabetes Mother   . Diabetes Brother   . Mental illness Brother   . Alcohol abuse Paternal Uncle   . Alcohol abuse Paternal Grandfather   . Breast cancer Maternal Aunt   . Breast cancer Paternal Aunt   . High blood pressure Sister   . Mental illness Other     runs in family   Social History Social History  Substance Use Topics  . Smoking status: Never Smoker  . Smokeless tobacco:  Never Used  . Alcohol use No   Allergies   Patient has no known allergies.  Review of Systems Review of Systems  Psychiatric/Behavioral: Positive for agitation and behavioral problems.    LEVEL 5 CAVEAT DUE TO PATIENT'S MENTAL CONDITION.   Physical Exam Updated Vital Signs BP (!) 161/100 (BP Location: Left Arm)   Pulse 100   Temp 98.6 F (37 C) (Oral)   Resp 20   SpO2 96%   Physical Exam  Constitutional: She is oriented to person, place, and time. She appears well-developed and well-nourished. No distress.  HENT:  Head: Normocephalic and atraumatic.  Eyes: Pupils are equal, round, and reactive to light.  Pulmonary/Chest: Effort normal.  Neurological: She is alert and oriented to person, place, and time.  Skin: Skin is warm and dry.  Psychiatric: Judgment normal. Her affect is angry. She is agitated, aggressive, hyperactive, actively hallucinating and combative. Thought content is delusional.  Nursing note and vitals reviewed.   ED Treatments / Results  DIAGNOSTIC STUDIES: Oxygen Saturation is unknown, pt combative.   COORDINATION OF CARE: 8:25 PM Pt would not answer any questions. Non-compliant. Will await arrival of family members for complete history and HPI  Labs (all labs ordered are listed, but only abnormal results are displayed) Labs Reviewed  BASIC METABOLIC PANEL - Abnormal; Notable for the following:       Result Value   Glucose, Bld 131 (*)    Creatinine, Ser 1.17 (*)    Calcium 10.5 (*)    GFR calc non Af Amer 52 (*)    All other components within normal limits  CBC WITH DIFFERENTIAL/PLATELET - Abnormal; Notable for the following:    WBC 15.0 (*)    RBC 5.14 (*)    Neutro Abs 11.0 (*)    Monocytes Absolute 1.3 (*)    All other components within normal limits  ETHANOL  RAPID URINE DRUG SCREEN, HOSP PERFORMED    EKG  EKG Interpretation None      Radiology No results found.  Procedures Procedures (including critical care  time)  Medications Ordered in ED Medications  ziprasidone (GEODON) injection 20 mg (20 mg Intramuscular Given 03/24/17 2106)  sterile water (preservative free) injection (10 mLs  Given 03/24/17 2106)  LORazepam (ATIVAN) injection 1 mg (1 mg Intramuscular Given 03/24/17 2217)    Initial Impression / Assessment and Plan / ED Course  I have reviewed the triage vital signs and the nursing notes.  Pertinent labs & imaging results that were available during my care of the patient were reviewed by me and considered in my medical decision making (see chart for details).     Patient is given Geodon along with Ativan and the  patient still not well controlled.  Patient will need TTS assessment due to her psychosis and hallucinations  Final Clinical Impressions(s) / ED Diagnoses   Final diagnoses:  None    New Prescriptions New Prescriptions   No medications on file  I personally performed the services described in this documentation, which was scribed in my presence. The recorded information has been reviewed and is accurate.   Dalia Heading, PA-C 03/26/17 [place], MD 03/26/17 [phone]

## 2017-03-24 NOTE — ED Triage Notes (Signed)
IVC off meds, in  Police custody

## 2017-03-24 NOTE — ED Notes (Signed)
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

## 2017-03-25 DIAGNOSIS — F22 Delusional disorders: Secondary | ICD-10-CM | POA: Diagnosis not present

## 2017-03-25 LAB — LITHIUM LEVEL: Lithium Lvl: 0.07 mmol/L — ABNORMAL LOW (ref 0.60–1.20)

## 2017-03-25 MED ORDER — ONDANSETRON HCL 4 MG PO TABS: 4.0000 mg | ORAL_TABLET | Freq: Three times a day (TID) | ORAL | Status: DC | PRN

## 2017-03-25 MED ORDER — LORAZEPAM 2 MG/ML IJ SOLN
1.0000 mg | Freq: Once | INTRAMUSCULAR | Status: AC
  Administered 2017-03-25: 1 mg via INTRAMUSCULAR
  Filled 2017-03-25: qty 1

## 2017-03-25 MED ORDER — LORAZEPAM 2 MG/ML IJ SOLN
2.0000 mg | Freq: Once | INTRAMUSCULAR | Status: AC
  Administered 2017-03-25: 2 mg via INTRAMUSCULAR
  Filled 2017-03-25: qty 1

## 2017-03-25 MED ORDER — BENZTROPINE MESYLATE 0.5 MG PO TABS
0.5000 mg | ORAL_TABLET | Freq: Every day | ORAL | Status: DC
  Filled 2017-03-25: qty 1

## 2017-03-25 MED ORDER — ALUM & MAG HYDROXIDE-SIMETH 200-200-20 MG/5ML PO SUSP: 30.0000 mL | ORAL | Status: DC | PRN

## 2017-03-25 MED ORDER — STERILE WATER FOR INJECTION IJ SOLN
INTRAMUSCULAR | Status: AC
  Administered 2017-03-25: 1.3 mL
  Filled 2017-03-25: qty 10

## 2017-03-25 MED ORDER — IBUPROFEN 200 MG PO TABS: 600.0000 mg | ORAL_TABLET | Freq: Three times a day (TID) | ORAL | Status: DC | PRN

## 2017-03-25 MED ORDER — QUETIAPINE FUMARATE ER 300 MG PO TB24
600.0000 mg | ORAL_TABLET | Freq: Every day | ORAL | Status: DC
  Filled 2017-03-25 (×5): qty 2

## 2017-03-25 MED ORDER — ZOLPIDEM TARTRATE 5 MG PO TABS: 5.0000 mg | ORAL_TABLET | Freq: Every evening | ORAL | Status: DC | PRN

## 2017-03-25 MED ORDER — SODIUM CHLORIDE 0.9 % IV BOLUS (SEPSIS)
1000.0000 mL | Freq: Once | INTRAVENOUS | Status: AC
  Administered 2017-03-25: 1000 mL via INTRAVENOUS

## 2017-03-25 MED ORDER — AMLODIPINE BESYLATE 5 MG PO TABS
5.0000 mg | ORAL_TABLET | Freq: Every day | ORAL | Status: DC
  Administered 2017-03-26 – 2017-03-30 (×3): 5 mg via ORAL
  Filled 2017-03-25 (×4): qty 1

## 2017-03-25 MED ORDER — LITHIUM CARBONATE ER 450 MG PO TBCR
450.0000 mg | EXTENDED_RELEASE_TABLET | Freq: Two times a day (BID) | ORAL | Status: DC
  Administered 2017-03-29 – 2017-03-30 (×3): 450 mg via ORAL
  Filled 2017-03-25 (×6): qty 1

## 2017-03-25 MED ORDER — LIP MEDEX EX OINT
TOPICAL_OINTMENT | Freq: Once | CUTANEOUS | Status: AC
  Administered 2017-03-25: 23:00:00 via TOPICAL
  Filled 2017-03-25: qty 7

## 2017-03-25 MED ORDER — PROPRANOLOL HCL 10 MG PO TABS
10.0000 mg | ORAL_TABLET | Freq: Three times a day (TID) | ORAL | Status: DC
  Administered 2017-03-26: 10 mg via ORAL
  Filled 2017-03-25 (×17): qty 1

## 2017-03-25 MED ORDER — ACETAMINOPHEN 325 MG PO TABS: 650.0000 mg | ORAL_TABLET | ORAL | Status: DC | PRN

## 2017-03-25 MED ORDER — ZIPRASIDONE MESYLATE 20 MG IM SOLR
20.0000 mg | Freq: Once | INTRAMUSCULAR | Status: AC
  Administered 2017-03-25: 20 mg via INTRAMUSCULAR
  Filled 2017-03-25: qty 20

## 2017-03-25 NOTE — ED Notes (Signed)
Up on side of bed eating dinner

## 2017-03-25 NOTE — ED Notes (Signed)
Patient refuses to let staff perform mouth care. Continued to offer toileting, however she continues to refuse. Yelling out stating "my dad and my cousin is here to pick me up and yall won't let me go." She then yells out and attempting to get out of restraints stating she will get out just watch me and then you all will be in trouble. Sitter at the bedside. Restraints intact.

## 2017-03-25 NOTE — ED Notes (Signed)
Patient continues to yell out, snap as if she is biting at staff, and making threats to staff. She remains in restraints for violent behavior. Refuses medications. Did drink a half of cup of sprite. She refuses to use the bathroom, stating "I am not going if I can't go home". Patient states her dad just left out of the room and told her to get up and come home. She has not had any visitors today. Sitter remains at bedside.

## 2017-03-25 NOTE — ED Notes (Signed)
She awakens and resumes her paranoid resistive endangerment of herself; stating "I need to leave". She seems confused and delusional and exhibits no ability to be aware of her limitations. Dr. Ashok Cordia is notified and a team assists her in repositioning in bed and we re-apply wrist and ankle restraints at this time.

## 2017-03-25 NOTE — BH Assessment (Addendum)
Assessment Note  Paula Dickson is an 51 y.o. female with history of Bipolar Affective Disorder, Schizophrenia, and depression.  Patient was unable to provide any details related to her visit to the ER on this day. She is in in restraints and repeats, "This is not right"..."This is not right". Patient with flight of ideas and pressured speech.  Patient unable to confirm or deny SI, HI, and AVH's. Per ED notes, "Pt has been medication non-complaint with her antipsychotics for approximately 2 weeks". Patient is dressed in hospital scrubs. Eye contact is fair. Patient is disorganized. Patient's insight and judgement are poor. Patient has received INPT treatment in the past at [facility], and last admissions was [facility]. Patient was hospitalized at [facility] 06/28/2015.  Per Dr. Parke Poisson and May, NP, patient meets criteria for a INPT admission.   Diagnosis: Bipolar Affective Disorder, Schizophrenia, and Depression  Past Medical History:  Past Medical History:  Diagnosis Date  . Bipolar affective disorder ([place])   . History of arthritis   . History of chicken pox   . History of depression   . History of genital warts   . history of heart murmur   . History of high blood pressure   . History of thyroid disease   . History of UTI   . Hypertension   . Schizophrenia [facility])     Past Surgical History:  Procedure Laterality Date  . ABLATION ON ENDOMETRIOSIS    . CYST REMOVAL NECK     around 11 years ago /benign  . MULTIPLE TOOTH EXTRACTIONS      Family History:  Family History  Problem Relation Age of Onset  . Arthritis Father   . Hyperlipidemia Father   . High blood pressure Father   . Diabetes Sister   . Diabetes Mother   . Diabetes Brother   . Mental illness Brother   . Alcohol abuse Paternal Uncle   . Alcohol abuse Paternal Grandfather   . Breast cancer Maternal Aunt   . Breast cancer Paternal Aunt   . High blood pressure Sister   . Mental illness Other     runs in family     Social History:  reports that she has never smoked. She has never used smokeless tobacco. She reports that she does not drink alcohol or use drugs.  Additional Social History:     CIWA: CIWA-Ar BP: (!) 162/102 Pulse Rate: (!) 111 COWS:    Allergies: No Known Allergies  Home Medications:  (Not in a hospital admission)  OB/GYN Status:  No LMP recorded. Patient is not currently having periods (Reason: Perimenopausal).  General Assessment Data Location of Assessment: [facility] ED TTS Assessment: In system Is this a Tele or Face-to-Face Assessment?: Face-to-Face Is this an Initial Assessment or a Re-assessment for this encounter?: Initial Assessment Marital status: Other (comment) (unk) Maiden name:  (unk) Is patient pregnant?: No Pregnancy Status: No Living Arrangements: Other (Comment), Parent ("I live with my father") Can pt return to current living arrangement?: Yes Admission Status: Voluntary Is patient capable of signing voluntary admission?: Yes Referral Source: Self/Family/Friend Insurance type:  [place] )     Crisis Care Plan Living Arrangements: Other (Comment), Parent ("I live with my father") Legal Guardian: Other: Name of Psychiatrist:  (no psychiatrist ) Name of Therapist:  (no therapist )  Education Status Is patient currently in school?: No Current Grade:  (n/a) Highest grade of school patient has completed:  (n/a) Name of school:  (n/a) Contact person:  (  n/a)  Risk to self with the past 6 months Suicidal Ideation:  (unable to determine ) Has patient been a risk to self within the past 6 months prior to admission? :  (unable to determine) Suicidal Intent:  (unk) Has patient had any suicidal intent within the past 6 months prior to admission? :  (unk) Is patient at risk for suicide?:  (unk) Suicidal Plan?:  (unk) Has patient had any suicidal plan within the past 6 months prior to admission? : Other (comment) Access to Means: No (unk) What has  been your use of drugs/alcohol within the last 12 months?:  (unk) Previous Attempts/Gestures:  (unk) How many times?:  (unk) Other Self Harm Risks:  (unk) Triggers for Past Attempts: Other (Comment) (umk) Intentional Self Injurious Behavior:  (unk) Family Suicide History: Unknown Recent stressful life event(s): Other (Comment) (unk) Persecutory voices/beliefs?:  (unk) Depression:  (unk) Depression Symptoms:  (unk) Substance abuse history and/or treatment for substance abuse?:  (unk) Suicide prevention information given to non-admitted patients:  (unk )  Risk to Others within the past 6 months Does patient have any lifetime risk of violence toward others beyond the six months prior to admission? : Unknown Thoughts of Harm to Others:  (unk) Current Homicidal Intent:  (unk) Current Homicidal Plan:  (unk) Access to Homicidal Means:  (unk) Identified Victim:  (unk) History of harm to others?:  (unk) Assessment of Violence:  (unk) Violent Behavior Description:  (patient is in restraints, aggitated, restless) Does patient have access to weapons?:  (unk) Criminal Charges Pending?:  (unk) Does patient have a court date:  (unk) Is patient on probation?: Unknown  Psychosis Hallucinations:  (unk) Delusions:  (unk)  Mental Status Report Appearance/Hygiene: In scrubs Eye Contact: Unable to Assess Motor Activity:  (unk) Speech: Logical/coherent Level of Consciousness: Alert Mood: Depressed Affect: Appropriate to circumstance Anxiety Level: None Thought Processes: Relevant, Coherent Judgement: Impaired Orientation: Person, Place, Time, Situation Obsessive Compulsive Thoughts/Behaviors: None  Cognitive Functioning Concentration: Decreased Memory: Recent Intact, Remote Intact IQ: Average Insight: Unable to Assess Impulse Control: Unable to Assess Appetite:  (UTA) Weight Loss:  (none reported) Weight Gain:  (none reported) Sleep: Decreased Total Hours of Sleep:   (unk) Vegetative Symptoms: None  ADLScreening [facility] Assessment Services) Patient's cognitive ability adequate to safely complete daily activities?: Yes Patient able to express need for assistance with ADLs?: Yes Independently performs ADLs?: Yes (appropriate for developmental age)     Prior Outpatient Therapy Prior Outpatient Therapy: No  ADL Screening (condition at time of admission) Patient's cognitive ability adequate to safely complete daily activities?: Yes Patient able to express need for assistance with ADLs?: Yes Independently performs ADLs?: Yes (appropriate for developmental age)             Advance Directives (For Healthcare) Does Patient Have a Medical Advance Directive?: No    Additional Information 1:1 In Past 12 Months?: No     Disposition:     On Site Evaluation by:   Reviewed with Physician:    Waldon Merl 03/25/2017 10:36 AM

## 2017-03-25 NOTE — ED Notes (Signed)
Patient continues to yell out and is now crying and stating "I have to go home I have appointments I need to go to and my daddy is here waiting. Why won't you all let me go. Please let me out of the restraints and I promise I will be quiet, I won't hurt anyone, and I won't run"

## 2017-03-25 NOTE — ED Notes (Signed)
Thus far, pt. Has steadfastly refused any oral intake, other than a Sprite, which she is being assisted to drink now by her sitter.

## 2017-03-25 NOTE — ED Notes (Signed)
Her agitation excalates, and even though she has a sitter present, she manages to remove her hands from her restraints and nearly falls. A team is quickly assembled, and we assist her back into bed and re-applied her restraints and meds. given as ordered by Dr. Ashok Cordia.

## 2017-03-25 NOTE — BH Assessment (Signed)
[facility] Assessment Progress Note   Patient is too sedated at this time to be assessed.  Clinician did talk to nurse Hulan Amato who said that patient was not making any sense when she was awake.  He said that she kept calling out someone's name.  According to IVC papers patient has been trying to lay down under a car.  Has been shoving people, throwing things.  Pt talks about making funeral arrangements.  Clinician spoke to Dr. Regenia Skeeter and let him know that assessment could not be done at this time as patient was too sedated.

## 2017-03-25 NOTE — ED Notes (Signed)
I have checked with our tech., and pt. Has refused all liquids (she refused the Sprite earlier). Dr. Canary Brim is notified of this--orders received.

## 2017-03-25 NOTE — ED Notes (Signed)
Called to room by nurse tech to room pt screaming and refusing to get back into bed placed back in restraints pt trying to leave pt is IVC'd

## 2017-03-25 NOTE — ED Notes (Signed)
She remains very paranoid indeed. She asks for everyone's name and accuses all of lying to her. When offered any food or drink she refuses it completely, accusing our staff of not being truthful in telling her what we are giving her. As I write this, she is receiving IV fluids.

## 2017-03-26 ENCOUNTER — Ambulatory Visit: Payer: Self-pay

## 2017-03-26 DIAGNOSIS — R45851 Suicidal ideations: Secondary | ICD-10-CM

## 2017-03-26 DIAGNOSIS — F22 Delusional disorders: Secondary | ICD-10-CM | POA: Diagnosis not present

## 2017-03-26 DIAGNOSIS — Z818 Family history of other mental and behavioral disorders: Secondary | ICD-10-CM

## 2017-03-26 DIAGNOSIS — F312 Bipolar disorder, current episode manic severe with psychotic features: Secondary | ICD-10-CM | POA: Diagnosis not present

## 2017-03-26 DIAGNOSIS — Z811 Family history of alcohol abuse and dependence: Secondary | ICD-10-CM

## 2017-03-26 MED ORDER — ZIPRASIDONE MESYLATE 20 MG IM SOLR
20.0000 mg | Freq: Once | INTRAMUSCULAR | Status: AC
  Administered 2017-03-26: 20 mg via INTRAMUSCULAR
  Filled 2017-03-26: qty 20

## 2017-03-26 MED ORDER — HALOPERIDOL LACTATE 5 MG/ML IJ SOLN
5.0000 mg | Freq: Once | INTRAMUSCULAR | Status: AC
  Administered 2017-03-26: 5 mg via INTRAMUSCULAR
  Filled 2017-03-26: qty 1

## 2017-03-26 MED ORDER — DIPHENHYDRAMINE HCL 50 MG/ML IJ SOLN
50.0000 mg | Freq: Once | INTRAMUSCULAR | Status: AC
  Administered 2017-03-26: 50 mg via INTRAMUSCULAR
  Filled 2017-03-26: qty 1

## 2017-03-26 MED ORDER — LORAZEPAM 2 MG/ML IJ SOLN
2.0000 mg | Freq: Once | INTRAMUSCULAR | Status: AC
  Administered 2017-03-26: 2 mg via INTRAMUSCULAR
  Filled 2017-03-26: qty 1

## 2017-03-26 MED ORDER — STERILE WATER FOR INJECTION IJ SOLN
INTRAMUSCULAR | Status: AC
  Administered 2017-03-26: 02:00:00
  Filled 2017-03-26: qty 10

## 2017-03-26 NOTE — ED Notes (Signed)
EKG given to EDP,Rancour,MD., for review. 

## 2017-03-26 NOTE — ED Notes (Signed)
Patient confused, agitated and is resistant to care. Pt hitting RN in the chest with redirection. Pt unsteady and looking under her bed on her knees. Pt paranoid about staff and hospital equipment. MD aware; new orders received.

## 2017-03-26 NOTE — Consult Note (Signed)
[facility] Consult   Reason for Consult:  mania Referring Physician:  EDP Patient Identification: Paula Dickson MRN:  [id number] Principal Diagnosis: Bipolar I disorder, most recent episode manic, severe with psychotic features [facility]) Diagnosis:   Patient Active Problem List   Diagnosis Date Noted  . Bipolar I disorder, most recent episode manic, severe with psychotic features ([place]) [F31.2] 06/14/2015    Priority: High  . Insomnia [G47.00] 12/27/2015  . Abnormal urinalysis [R82.90] 12/27/2015  . Hyperthyroidism [E05.90] 12/03/2015  . 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 female patient admitted with mania.  HPI:  On admission:  51 y.o. female with history of Bipolar Affective Disorder, Schizophrenia, and depression.  Patient was unable to provide any details related to her visit to the ER on this day. She is in in restraints and repeats, "This is not right"..."This is not right". Patient with flight of ideas and pressured speech.  Patient unable to confirm or deny SI, HI, and AVH's. Per ED notes, "Pt has been medication non-complaint with her antipsychotics for approximately 2 weeks". Patient is dressed in hospital scrubs. Eye contact is fair. Patient is disorganized. Patient's insight and judgement are poor. Patient has received INPT treatment in the past at [facility], [facility], and last admissions was [facility]. Patient was hospitalized at [facility] 06/28/2015.  Patient was placed in restraints, medicated for agitation/violence.  Prior to admission she tried to lay in the road to kill herself because the voices were telling her to do this.    Past Psychiatric History: bipolar disorder  Risk to Self: Suicidal Ideation:  (unable to determine ) Suicidal Intent:  (unk) Is patient at risk for suicide?:  (unk) Suicidal Plan?:  (unk) Access to Means: No (unk) What has been your use of  drugs/alcohol within the last 12 months?:  (unk) How many times?:  (unk) Other Self Harm Risks:  (unk) Triggers for Past Attempts: Other (Comment) (umk) Intentional Self Injurious Behavior:  (unk) Risk to Others: Thoughts of Harm to Others:  (unk) Current Homicidal Intent:  (unk) Current Homicidal Plan:  (unk) Access to Homicidal Means:  (unk) Identified Victim:  (unk) History of harm to others?:  (unk) Assessment of Violence:  (unk) Violent Behavior Description:  (patient is in restraints, aggitated, restless) Does patient have access to weapons?:  (unk) Criminal Charges Pending?:  (unk) Does patient have a court date:  (unk) Prior Inpatient Therapy: Prior Inpatient Therapy:  (unk) Prior Therapy Dates: unk (n/a) Prior Therapy Facilty/Provider(s):  (unk) Reason for Treatment:  (unk) Prior Outpatient Therapy: Prior Outpatient Therapy:  (unk) Prior Therapy Dates: unk Prior Therapy Facilty/Provider(s):  (unk) Reason for Treatment:  (unk ) Does patient have an ACCT team?: Unknown Does patient have Intensive In-House Services?  : Unknown Does patient have [name] services? : Unknown Does patient have P4CC services?: Unknown  Past Medical History:  Past Medical History:  Diagnosis Date  . Bipolar affective disorder ([place])   . History of arthritis   . History of chicken pox   . History of depression   . History of genital warts   . history of heart murmur   . History of high blood pressure   . History of thyroid disease   . History of UTI   . Hypertension   . Schizophrenia [facility])     Past Surgical History:  Procedure Laterality Date  . ABLATION ON ENDOMETRIOSIS    .  CYST REMOVAL NECK     around 11 years ago /benign  . MULTIPLE TOOTH EXTRACTIONS     Family History:  Family History  Problem Relation Age of Onset  . Arthritis Father   . Hyperlipidemia Father   . High blood pressure Father   . Diabetes Sister   . Diabetes Mother   . Diabetes Brother   . Mental illness  Brother   . Alcohol abuse Paternal Uncle   . Alcohol abuse Paternal Grandfather   . Breast cancer Maternal Aunt   . Breast cancer Paternal Aunt   . High blood pressure Sister   . Mental illness Other     runs in family   Family Psychiatric  History: unknown Social History:  History  Alcohol Use No     History  Drug Use No    Social History   Social History  . Marital status: Single    Spouse name: N/A  . Number of children: N/A  . Years of education: N/A   Social History Main Topics  . Smoking status: Never Smoker  . Smokeless tobacco: Never Used  . Alcohol use No  . Drug use: No  . Sexual activity: Not Currently   Other Topics Concern  . None   Social History Narrative  . None   Additional Social History:    Allergies:  No Known Allergies  Labs:  Results for orders placed or performed during the hospital encounter of 03/24/17 (from the past 48 hour(s))  Basic metabolic panel     Status: Abnormal   Collection Time: 03/24/17 10:56 PM  Result Value Ref Range   Sodium 143 135 - 145 mmol/L   Potassium 3.8 3.5 - 5.1 mmol/L   Chloride 108 101 - 111 mmol/L   CO2 24 22 - 32 mmol/L   Glucose, Bld 131 (H) 65 - 99 mg/dL   BUN 14 6 - 20 mg/dL   Creatinine, Ser 1.17 (H) 0.44 - 1.00 mg/dL   Calcium 10.5 (H) 8.9 - 10.3 mg/dL   GFR calc non Af Amer 52 (L) >60 mL/min   GFR calc Af Amer >60 >60 mL/min    Comment: (NOTE) The eGFR has been calculated using the CKD EPI equation. This calculation has not been validated in all clinical situations. eGFR's persistently <60 mL/min signify possible Chronic Kidney Disease.    Anion gap 11 5 - 15  CBC with Differential     Status: Abnormal   Collection Time: 03/24/17 10:56 PM  Result Value Ref Range   WBC 15.0 (H) 4.0 - 10.5 K/uL   RBC 5.14 (H) 3.87 - 5.11 MIL/uL   Hemoglobin 15.0 12.0 - 15.0 g/dL   HCT 44.8 36.0 - 46.0 %   MCV 87.2 78.0 - 100.0 fL   MCH 29.2 26.0 - 34.0 pg   MCHC 33.5 30.0 - 36.0 g/dL   RDW 13.4 11.5 -  15.5 %   Platelets 280 150 - 400 K/uL   Neutrophils Relative % 73 %   Neutro Abs 11.0 (H) 1.7 - 7.7 K/uL   Lymphocytes Relative 18 %   Lymphs Abs 2.7 0.7 - 4.0 K/uL   Monocytes Relative 9 %   Monocytes Absolute 1.3 (H) 0.1 - 1.0 K/uL   Eosinophils Relative 0 %   Eosinophils Absolute 0.0 0.0 - 0.7 K/uL   Basophils Relative 0 %   Basophils Absolute 0.0 0.0 - 0.1 K/uL  Ethanol     Status: None   Collection Time: 03/24/17 10:56  PM  Result Value Ref Range   Alcohol, Ethyl (B) <5 <5 mg/dL    Comment:        LOWEST DETECTABLE LIMIT FOR SERUM ALCOHOL IS 5 mg/dL FOR MEDICAL PURPOSES ONLY   Lithium level     Status: Abnormal   Collection Time: 03/25/17 12:05 PM  Result Value Ref Range   Lithium Lvl 0.07 (L) 0.60 - 1.20 mmol/L    Current Facility-Administered Medications  Medication Dose Route Frequency Provider Last Rate Last Dose  . acetaminophen (TYLENOL) tablet 650 mg  650 mg Oral Q4H PRN Dalia Heading, PA-C      . alum & mag hydroxide-simeth (MAALOX/MYLANTA) 200-200-20 MG/5ML suspension 30 mL  30 mL Oral PRN Dalia Heading, PA-C      . amLODipine (NORVASC) tablet 5 mg  5 mg Oral Daily Lajean Saver, MD   5 mg at 03/26/17 1335  . benztropine (COGENTIN) tablet 0.5 mg  0.5 mg Oral QHS Lajean Saver, MD      . lithium carbonate (ESKALITH) CR tablet 450 mg  450 mg Oral Q12H Lajean Saver, MD      . ondansetron [facility]) tablet 4 mg  4 mg Oral Q8H PRN Dalia Heading, PA-C      . propranolol (INDERAL) tablet 10 mg  10 mg Oral TID Lajean Saver, MD   10 mg at 03/26/17 1335  . QUEtiapine (SEROQUEL XR) 24 hr tablet 600 mg  600 mg Oral QHS Lajean Saver, MD       Current Outpatient Prescriptions  Medication Sig Dispense Refill  . amLODipine (NORVASC) 5 MG tablet TAKE 1 TABLET BY MOUTH EVERY DAY (Patient taking differently: TAKE 5 MG BY MOUTH EVERY DAY) 30 tablet 6  . benztropine (COGENTIN) 0.5 MG tablet Take 0.5 mg by mouth at bedtime.    . Biotin 1000 MCG CHEW Chew 1 capsule by mouth  daily.    Marland Kitchen lithium carbonate (ESKALITH) 450 MG CR tablet Take 1 tablet (450 mg total) by mouth every 12 (twelve) hours. 60 tablet 0  . Multiple Vitamins-Minerals ([employer] SPECTRAVITE ADULT 50+ PO) Take 1 tablet by mouth daily.    Marland Kitchen omega-3 acid ethyl esters (LOVAZA) 1 g capsule Take 1 g by mouth 3 (three) times daily.    . propranolol (INDERAL) 20 MG tablet Take 1 tablet (20 mg total) by mouth 3 (three) times daily. (Patient taking differently: Take 10 mg by mouth 3 (three) times daily. ) 90 tablet 0  . QUEtiapine (SEROQUEL XR) 300 MG 24 hr tablet Take 600 mg by mouth at bedtime.    Marland Kitchen QUEtiapine (SEROQUEL) 400 MG tablet Take 2 tablets (800 mg total) by mouth at bedtime. (Patient not taking: Reported on 03/24/2017) 60 tablet 0  . temazepam (RESTORIL) 30 MG capsule Take 1 capsule (30 mg total) by mouth at bedtime as needed for sleep. (Patient not taking: Reported on 02/13/2017) 30 capsule 0    Musculoskeletal: Strength & Muscle Tone: within normal limits Gait & Station: normal Patient leans: N/A  Psychiatric Specialty Exam: Physical Exam  Constitutional: She is oriented to person, place, and time. She appears well-developed.  HENT:  Head: Normocephalic.  Neck: Normal range of motion.  Respiratory: Effort normal.  Musculoskeletal: Normal range of motion.  Neurological: She is alert and oriented to person, place, and time.  Psychiatric: Her affect is angry and labile. Her speech is rapid and/or pressured. She is agitated, aggressive and actively hallucinating. Cognition and memory are impaired. She expresses impulsivity. She expresses suicidal ideation. She  expresses suicidal plans.    Review of Systems  Psychiatric/Behavioral: Positive for depression, hallucinations and suicidal ideas.  All other systems reviewed and are negative.   Blood pressure (!) 197/99, pulse 80, temperature 98.4 F (36.9 C), temperature source Oral, resp. rate 18, SpO2 100 %.There is no height or weight on file to  calculate BMI.  General Appearance: Disheveled  Eye Contact:  Minimal  Speech:  Pressured  Volume:  Increased  Mood:  Angry and Irritable  Affect:  Congruent  Thought Process:  Descriptions of Associations: Tangential  Orientation:  Other:  person  Thought Content:  Paranoid Ideation and Tangential, hallucinations  Suicidal Thoughts:  Yes.  with intent/plan  Homicidal Thoughts:  No  Memory:  Immediate;   Poor Recent;   Poor Remote;   Poor  Judgement:  Impaired  Insight:  Lacking  Psychomotor Activity:  Increased  Concentration:  Concentration: Poor and Attention Span: Poor  Recall:  Poor  Fund of Knowledge:  Fair  Language:  Fair  Akathisia:  No  Handed:  Right  AIMS (if indicated):     Assets:  Leisure Time Physical Health Resilience  ADL's:  Intact  Cognition:  Impaired,  Mild  Sleep:        Treatment Plan Summary: Daily contact with patient to assess and evaluate symptoms and progress in treatment, Medication management and Plan bipolar affective disorder, mania, severe with psychosis:  -Crisis stabilization -Medication management:  Medical medications restarted along with Cogentin 0.5 mg BID for EPS, Seroquel 600 mg at bedtime for sleep/psychosis, and Lithium 450 mg BID for mood stabilization.  PRN injections given for agitation/aggression/violence -Individual counseling  Disposition: Recommend psychiatric Inpatient admission when medically cleared.  Waylan Boga, NP 03/26/2017 2:49 PM  Patient seen face-to-face for psychiatric evaluation, chart reviewed and case discussed with the physician extender and developed treatment plan. Reviewed the information documented and agree with the treatment plan. Corena Pilgrim, MD

## 2017-03-26 NOTE — ED Notes (Signed)
Pt eating lunch, calm and cooperative, with sitter at bedside.

## 2017-03-26 NOTE — BH Assessment (Signed)
[facility] Assessment Progress Note  Per Corena Pilgrim, MD, this pt requires psychiatric hospitalization at this time.  Pt presents under IVC initiated by [employer], which Neita Garnet, MD has upheld.  The following facilities have been contacted to seek placement for this pt, with results as noted:  Beds available, information sent, decision pending:  [facility]:  [facility] (due to behavior) [facility] (due to pt acuity)   At capacity:  [facility] (no high acuity beds) [facility] (no high acuity beds)   Jalene Mullet, MA Triage Specialist [phone]

## 2017-03-26 NOTE — ED Provider Notes (Signed)
Blood pressure (!) 156/104, pulse 88, temperature 98.4 F (36.9 C), temperature source Oral, resp. rate 18, SpO2 99 %.  In short, Paula Dickson is a 51 y.o. female with a chief complaint of Suicidal (IVC ) .  Refer to the original H&P for additional details.   08:42 PM Patient is becoming acutely agitated and combative with staff. Unable to redirect verbally. Reports of being violent with staff. Will give Haldol, Ativan, Benadryl and reassess.  CRITICAL CARE Performed by: Margette Fast Total critical care time: 30 minutes Critical care time was exclusive of separately billable procedures and treating other patients. Critical care was necessary to treat or prevent imminent or life-threatening deterioration. Critical care was time spent personally by me on the following activities: development of treatment plan with patient and/or surrogate as well as nursing, discussions with consultants, evaluation of patient's response to treatment, examination of patient, obtaining history from patient or surrogate, ordering and performing treatments and interventions, ordering and review of laboratory studies, ordering and review of radiographic studies, pulse oximetry and re-evaluation of patient's condition.  The patient was placed in physical restraints as well. Order placed for temporary restraint use given concern for staff and patient safety.   Nanda Quinton, MD Emergency Medicine    Margette Fast, MD 03/27/17 [phone]

## 2017-03-26 NOTE — ED Notes (Signed)
Pt. Requested to use the bathroom but refused to use the bedpan x 4.

## 2017-03-26 NOTE — ED Notes (Signed)
Pt ambulated to the bathroom with assist, pt sts she cannot see very well due to her not wearing her eye glasses.

## 2017-03-26 NOTE — ED Notes (Signed)
Pt did not eat any of her dinner tray, writer provided pt with a [place] sandwich, and water.  Pt ate 1/2 of the sandwich and drank all her fluids. Pt is now resting, writer observed pt with her eyes closed.

## 2017-03-26 NOTE — ED Notes (Signed)
This writer attempt to pull PT up in bed to eat meal. PT told this Chief Strategy Officer to leave her alone

## 2017-03-26 NOTE — ED Notes (Addendum)
Pt ambulated with assist to bathroom.  Unable to get urine sample.  Pt changed into paper scrubs at this time.  Will recheck bp once stable to facilitate transfer to [facility]

## 2017-03-26 NOTE — ED Notes (Addendum)
Pt remaining calm at this time.  Pt transferred to TCU with NT assist.  Belongings in locker 28

## 2017-03-26 NOTE — ED Notes (Signed)
Some bruising noted to bilateral wrist.  Per report, from hard cuff restraints at admission

## 2017-03-26 NOTE — ED Notes (Signed)
Trial removal of arm restraints.  Pt eating cereal and applesauce.  Drinking orange juice.  Pt continues to say she needs to leave.  Wants to leave room for breakfast to be with "the others".  Not completely following directions.  Will leave ankle restraints at this time with sitter 1:1 at bedside for safety.

## 2017-03-26 NOTE — ED Notes (Signed)
Pt aware that a urine sample was needed.  Pt ambulated to the bathroom and hat was put in toilet but pt "missed the mark"!  Therefore no sample was collected.  RN notified.

## 2017-03-26 NOTE — ED Notes (Signed)
Trial for restraint removal.  Pt eating lunch

## 2017-03-26 NOTE — ED Notes (Signed)
Patient is yelling out, "We must tell daddy" agitated and attempting to untie restraints stating she has to get out of here.

## 2017-03-26 NOTE — ED Notes (Signed)
Patient sitting up in the bed and is talking to unseen people and appears to be responding to internal stimuli. Patient demanding that RN leave the room and is now demanding to have a phone placed in her room. Pt unable to be redirected and is continuing to yell at staff.

## 2017-03-27 DIAGNOSIS — F312 Bipolar disorder, current episode manic severe with psychotic features: Secondary | ICD-10-CM | POA: Diagnosis not present

## 2017-03-27 DIAGNOSIS — F22 Delusional disorders: Secondary | ICD-10-CM | POA: Diagnosis not present

## 2017-03-27 DIAGNOSIS — Z818 Family history of other mental and behavioral disorders: Secondary | ICD-10-CM | POA: Diagnosis not present

## 2017-03-27 DIAGNOSIS — R45851 Suicidal ideations: Secondary | ICD-10-CM | POA: Diagnosis not present

## 2017-03-27 DIAGNOSIS — Z811 Family history of alcohol abuse and dependence: Secondary | ICD-10-CM | POA: Diagnosis not present

## 2017-03-27 MED ORDER — STERILE WATER FOR INJECTION IJ SOLN
INTRAMUSCULAR | Status: AC
  Administered 2017-03-27: 2.1 mL
  Filled 2017-03-27: qty 10

## 2017-03-27 MED ORDER — DIPHENHYDRAMINE HCL 50 MG/ML IJ SOLN
50.0000 mg | INTRAMUSCULAR | Status: AC
  Administered 2017-03-27: 50 mg via INTRAMUSCULAR
  Filled 2017-03-27: qty 1

## 2017-03-27 MED ORDER — ZIPRASIDONE MESYLATE 20 MG IM SOLR
10.0000 mg | Freq: Once | INTRAMUSCULAR | Status: AC
  Administered 2017-03-27: 10 mg via INTRAMUSCULAR
  Filled 2017-03-27: qty 20

## 2017-03-27 MED ORDER — LORAZEPAM 2 MG/ML IJ SOLN
2.0000 mg | INTRAMUSCULAR | Status: AC
  Administered 2017-03-27: 2 mg via INTRAMUSCULAR
  Filled 2017-03-27: qty 1

## 2017-03-27 MED ORDER — LORAZEPAM 2 MG/ML IJ SOLN
1.0000 mg | Freq: Once | INTRAMUSCULAR | Status: AC
  Administered 2017-03-27: 1 mg via INTRAMUSCULAR
  Filled 2017-03-27: qty 1

## 2017-03-27 MED ORDER — CHLORPROMAZINE HCL 25 MG/ML IJ SOLN
25.0000 mg | Freq: Once | INTRAMUSCULAR | Status: AC
  Administered 2017-03-27: 25 mg via INTRAMUSCULAR
  Filled 2017-03-27: qty 1

## 2017-03-27 MED ORDER — HALOPERIDOL LACTATE 5 MG/ML IJ SOLN
5.0000 mg | INTRAMUSCULAR | Status: AC
  Administered 2017-03-27: 5 mg via INTRAMUSCULAR
  Filled 2017-03-27: qty 1

## 2017-03-27 NOTE — ED Notes (Signed)
Patient woke up, came out into hallway and attempted to walk into another patient's room.  Security officers assisted patient back to bed.  Sitter outside of patient's door.

## 2017-03-27 NOTE — ED Notes (Signed)
Patient released from restraints due to she is sleeping from medication.

## 2017-03-27 NOTE — ED Notes (Signed)
Patient asleep on stretcher.

## 2017-03-27 NOTE — ED Notes (Signed)
Patient remains agitated. Pt removing restraints several times and attempting to get out of bed. Pt requesting to use the bathroom. Staff in room to assist pt to bathroom, pt then states "I don't have to use the bathroom, I just want to go out to walk". Staff offered the use of the bathroom multiple times, but pt again refused.

## 2017-03-27 NOTE — Consult Note (Signed)
[facility] Consult   Reason for Consult:  mania Referring Physician:  EDP Patient Identification: Paula Dickson MRN:  [id number] Principal Diagnosis: Bipolar I disorder, most recent episode manic, severe with psychotic features [facility]) Diagnosis:   Patient Active Problem List   Diagnosis Date Noted  . Bipolar I disorder, most recent episode manic, severe with psychotic features ([place]) [F31.2] 06/14/2015    Priority: High  . Insomnia [G47.00] 12/27/2015  . Abnormal urinalysis [R82.90] 12/27/2015  . Hyperthyroidism [E05.90] 12/03/2015  . 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 female patient admitted with mania.  HPI:  Patient was very agitated last night and received PRN medications, partial restraints used.  Today, she is sleepy but calmer.    Past Psychiatric History: bipolar disorder  Risk to Self: Yes Risk to Others: NOne Prior Inpatient Therapy: Prior Inpatient Therapy:  (unk) Prior Therapy Dates: unk (n/a) Prior Therapy Facilty/Provider(s):  (unk) Reason for Treatment:  (unk) Prior Outpatient Therapy: Prior Outpatient Therapy:  (unk) Prior Therapy Dates: unk Prior Therapy Facilty/Provider(s):  (unk) Reason for Treatment:  (unk ) Does patient have an ACCT team?: Unknown Does patient have Intensive In-House Services?  : Unknown Does patient have [name] services? : Unknown Does patient have P4CC services?: Unknown  Past Medical History:  Past Medical History:  Diagnosis Date  . Bipolar affective disorder ([place])   . History of arthritis   . History of chicken pox   . History of depression   . History of genital warts   . history of heart murmur   . History of high blood pressure   . History of thyroid disease   . History of UTI   . Hypertension   . Schizophrenia [facility])     Past Surgical History:  Procedure Laterality Date  . ABLATION ON ENDOMETRIOSIS     . CYST REMOVAL NECK     around 11 years ago /benign  . MULTIPLE TOOTH EXTRACTIONS     Family History:  Family History  Problem Relation Age of Onset  . Arthritis Father   . Hyperlipidemia Father   . High blood pressure Father   . Diabetes Sister   . Diabetes Mother   . Diabetes Brother   . Mental illness Brother   . Alcohol abuse Paternal Uncle   . Alcohol abuse Paternal Grandfather   . Breast cancer Maternal Aunt   . Breast cancer Paternal Aunt   . High blood pressure Sister   . Mental illness Other     runs in family   Family Psychiatric  History: unknown Social History:  History  Alcohol Use No     History  Drug Use No    Social History   Social History  . Marital status: Single    Spouse name: N/A  . Number of children: N/A  . Years of education: N/A   Social History Main Topics  . Smoking status: Never Smoker  . Smokeless tobacco: Never Used  . Alcohol use No  . Drug use: No  . Sexual activity: Not Currently   Other Topics Concern  . None   Social History Narrative  . None   Additional Social History:    Allergies:  No Known Allergies  Labs:  Results for orders placed or performed during the hospital encounter of 03/24/17 (from the past 48 hour(s))  Lithium level     Status: Abnormal  Collection Time: 03/25/17 12:05 PM  Result Value Ref Range   Lithium Lvl 0.07 (L) 0.60 - 1.20 mmol/L    Current Facility-Administered Medications  Medication Dose Route Frequency Provider Last Rate Last Dose  . acetaminophen (TYLENOL) tablet 650 mg  650 mg Oral Q4H PRN Dalia Heading, PA-C      . alum & mag hydroxide-simeth (MAALOX/MYLANTA) 200-200-20 MG/5ML suspension 30 mL  30 mL Oral PRN Dalia Heading, PA-C      . amLODipine (NORVASC) tablet 5 mg  5 mg Oral Daily Lajean Saver, MD   5 mg at 03/26/17 1335  . benztropine (COGENTIN) tablet 0.5 mg  0.5 mg Oral QHS Lajean Saver, MD      . lithium carbonate (ESKALITH) CR tablet 450 mg  450 mg Oral Q12H  Lajean Saver, MD      . ondansetron [facility]) tablet 4 mg  4 mg Oral Q8H PRN Dalia Heading, PA-C      . propranolol (INDERAL) tablet 10 mg  10 mg Oral TID Lajean Saver, MD   10 mg at 03/26/17 1335  . QUEtiapine (SEROQUEL XR) 24 hr tablet 600 mg  600 mg Oral QHS Lajean Saver, MD       Current Outpatient Prescriptions  Medication Sig Dispense Refill  . amLODipine (NORVASC) 5 MG tablet TAKE 1 TABLET BY MOUTH EVERY DAY (Patient taking differently: TAKE 5 MG BY MOUTH EVERY DAY) 30 tablet 6  . benztropine (COGENTIN) 0.5 MG tablet Take 0.5 mg by mouth at bedtime.    . Biotin 1000 MCG CHEW Chew 1 capsule by mouth daily.    Marland Kitchen lithium carbonate (ESKALITH) 450 MG CR tablet Take 1 tablet (450 mg total) by mouth every 12 (twelve) hours. 60 tablet 0  . Multiple Vitamins-Minerals ([employer] SPECTRAVITE ADULT 50+ PO) Take 1 tablet by mouth daily.    Marland Kitchen omega-3 acid ethyl esters (LOVAZA) 1 g capsule Take 1 g by mouth 3 (three) times daily.    . propranolol (INDERAL) 20 MG tablet Take 1 tablet (20 mg total) by mouth 3 (three) times daily. (Patient taking differently: Take 10 mg by mouth 3 (three) times daily. ) 90 tablet 0  . QUEtiapine (SEROQUEL XR) 300 MG 24 hr tablet Take 600 mg by mouth at bedtime.    Marland Kitchen QUEtiapine (SEROQUEL) 400 MG tablet Take 2 tablets (800 mg total) by mouth at bedtime. (Patient not taking: Reported on 03/24/2017) 60 tablet 0  . temazepam (RESTORIL) 30 MG capsule Take 1 capsule (30 mg total) by mouth at bedtime as needed for sleep. (Patient not taking: Reported on 02/13/2017) 30 capsule 0    Musculoskeletal: Strength & Muscle Tone: within normal limits Gait & Station: normal Patient leans: N/A  Psychiatric Specialty Exam: Physical Exam  Constitutional: She is oriented to person, place, and time. She appears well-developed.  HENT:  Head: Normocephalic.  Neck: Normal range of motion.  Respiratory: Effort normal.  Musculoskeletal: Normal range of motion.  Neurological: She is alert and  oriented to person, place, and time.  Psychiatric: Her affect is angry and labile. Her speech is rapid and/or pressured. She is agitated, aggressive and actively hallucinating. Cognition and memory are impaired. She expresses impulsivity. She expresses suicidal ideation. She expresses suicidal plans.    Review of Systems  Psychiatric/Behavioral: Positive for depression, hallucinations and suicidal ideas.  All other systems reviewed and are negative.   Blood pressure 138/64, pulse 62, temperature 98.3 F (36.8 C), temperature source Oral, resp. rate 16, SpO2 98 %.There is no  height or weight on file to calculate BMI.  General Appearance: Disheveled  Eye Contact:  Minimal  Speech:  Pressured  Volume:  Increased  Mood:  Angry and Irritable  Affect:  Congruent  Thought Process:  Descriptions of Associations: Tangential  Orientation:  Other:  person  Thought Content:  Paranoid Ideation and Tangential, hallucinations  Suicidal Thoughts:  Yes.  with intent/plan  Homicidal Thoughts:  No  Memory:  Immediate;   Poor Recent;   Poor Remote;   Poor  Judgement:  Impaired  Insight:  Lacking  Psychomotor Activity:  Increased  Concentration:  Concentration: Poor and Attention Span: Poor  Recall:  Poor  Fund of Knowledge:  Fair  Language:  Fair  Akathisia:  No  Handed:  Right  AIMS (if indicated):     Assets:  Leisure Time Physical Health Resilience  ADL's:  Intact  Cognition:  Impaired,  Mild  Sleep:        Treatment Plan Summary: Daily contact with patient to assess and evaluate symptoms and progress in treatment, Medication management and Plan bipolar affective disorder, mania, severe with psychosis:  -Crisis stabilization -Medication management:  Continued medical medications along with Cogentin 0.5 mg BID for EPS, Seroquel 600 mg at bedtime for sleep/psychosis, and Lithium 450 mg BID for mood stabilization.  PRN injections given for agitation/aggression/violence -Individual  counseling  Disposition: Recommend psychiatric Inpatient admission when medically cleared.  Waylan Boga, NP 03/27/2017 9:57 AM  Patient seen face-to-face for psychiatric evaluation, chart reviewed and case discussed with the physician extender and developed treatment plan. Reviewed the information documented and agree with the treatment plan. Corena Pilgrim, MD

## 2017-03-27 NOTE — ED Notes (Signed)
Patient continues to use attends for toileting.  Will continue to try to obtain urine sample.  Sitter informed that urine is needed.

## 2017-03-27 NOTE — ED Notes (Signed)
Patient sitting up in bed watching television. Pt dismissive of staff during assessment, holding her hand up in the air and waving RN away while shutting her eyes. No distress noted a this time. Sitter at bedside for safety.

## 2017-03-27 NOTE — ED Notes (Signed)
Patient is awake, requesting to use the phone. Informed patient of the rules and recommended she call the personal after 9:00am in the morning. Pt is refusing to take scheduled, routine medications.

## 2017-03-27 NOTE — ED Notes (Signed)
Called nurse practitioner, Waylan Boga, because patient refused to stay in room and required security to escort her again back to her room.  Jamison ordered Thorazine 25mg . IM.

## 2017-03-27 NOTE — ED Notes (Signed)
Nurse tried to get patient to get up and walk around room to assess whether she would be a candidate to go to SAPU, but patient refused to get up and walk.  Night shift nursing staff reported that they put an attends on patient because patient refused to get up to toilet.

## 2017-03-27 NOTE — ED Notes (Signed)
Tried to administer medications to patient.  Patient responded, "sit them over there and if I should take them I will."  Nurse encouraged patient to take medications but she continued to refuse.

## 2017-03-27 NOTE — ED Notes (Signed)
Patient wandering out of room and refuses to go back into room.  Security called to escort patient back to bed.

## 2017-03-28 NOTE — ED Notes (Signed)
Spoke to the pharmaicst about pt's hold meds from last night, was suggested to wait until morning doses , and to ask psychiatrist about seroquel due to its high dosage.

## 2017-03-28 NOTE — BH Assessment (Signed)
Reassessment:   Writer attempted to reassess patient on this day. Writer asked patient to go to her room so that the reassessment could be conducted. Patient refused to go to her room stating, "I don't feel comfortable". Writer offered to take patient to the dayroom instead. Patient sts, "Nope I don't like that room either". Patient requested to speak to me in another private location. Patient informed that it was not other locations within the SAPPU to speak with her in private. Patient refused the reassessment. Patient continues to wonder in the halls. She is bizarre. Writer unable to determine SI, HI, and AVH's.

## 2017-03-28 NOTE — ED Notes (Addendum)
Pt refuse her vital signs said,"She wants her vitals  after she eats breakfast." RN is notified

## 2017-03-28 NOTE — Progress Notes (Addendum)
03/28/17 1345:  Pt was on the phone.  1408:  LRT introduced self to pt and offered activities, pt declined activities.   [name], LRT/CTRS

## 2017-03-29 DIAGNOSIS — Z818 Family history of other mental and behavioral disorders: Secondary | ICD-10-CM | POA: Diagnosis not present

## 2017-03-29 DIAGNOSIS — Z811 Family history of alcohol abuse and dependence: Secondary | ICD-10-CM | POA: Diagnosis not present

## 2017-03-29 DIAGNOSIS — F312 Bipolar disorder, current episode manic severe with psychotic features: Secondary | ICD-10-CM | POA: Diagnosis not present

## 2017-03-29 DIAGNOSIS — F22 Delusional disorders: Secondary | ICD-10-CM | POA: Diagnosis not present

## 2017-03-29 NOTE — ED Notes (Signed)
Pt refusing to allow this nurse obtain vs at this time.

## 2017-03-29 NOTE — Progress Notes (Signed)
TTS spoke with Paula Dickson at [facility] to verify the pt's bed status. [name] requested TTS resend the pt's referral to be reviewed. Has been resent.  Lind Covert, MSW, [place] TTS Specialist [phone]

## 2017-03-29 NOTE — Progress Notes (Signed)
Per Quillian Quince, pt has been declined by [facility] due to aggression. Per Kennyth Lose at [facility] pt has been declined due to aggression and behaviors. Pt may be able to be reviewed again pending 24 hours of appropriate behaviors observed in the ED.  Lind Covert, MSW, [place] TTS Specialist [phone]

## 2017-03-29 NOTE — ED Notes (Signed)
Pt initially refused Lithium during morning medication, after speaking with the psychiatrist pt then took Lithium. Pt presents with paranoia,disorganized, tangential on approach. Encouragement and support provided. Special checks q 15 mins in place for safety, Video monitoring in place. Will continue to monitor.

## 2017-03-29 NOTE — Consult Note (Signed)
[facility] Psych ED Progress Note  03/29/2017 10:25 AM Paula Dickson  MRN:  [id number] Subjective: ''Why I am here? I have stuffs to take care off at home I don't belong here.'' Objective: Patient seen and interviewed in her room with psych team, she is a poor historian with history of Bipolar disorder. Patient remains hypomanic, tangential, disorganized and paranoid. Patient is defiant, oppositional and argumentative. She has been partially compliant with her medications, refusing some of her medications due to fear of being poisoned. Patient has been pacing the unit and required frequent re-directions.  Principal Problem: Bipolar I disorder, most recent episode manic, severe with psychotic features ([name]) Diagnosis:   Patient Active Problem List   Diagnosis Date Noted  . Bipolar I disorder, most recent episode manic, severe with psychotic features ([place]) [F31.2] 06/14/2015    Priority: High  . Insomnia [G47.00] 12/27/2015  . Abnormal urinalysis [R82.90] 12/27/2015  . Hyperthyroidism [E05.90] 12/03/2015  . 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 Bipolar affective disorder ([place])   . History of arthritis   . History of chicken pox   . History of depression   . History of genital warts   . history of heart murmur   . History of high blood pressure   . History of thyroid disease   . History of UTI   . Hypertension   . Schizophrenia [facility])     Past Surgical History:  Procedure Laterality Date  . ABLATION ON ENDOMETRIOSIS    . CYST REMOVAL NECK     around 11 years ago /benign  . MULTIPLE TOOTH EXTRACTIONS     Family History:  Family History  Problem Relation Age of Onset  . Arthritis Father   . Hyperlipidemia Father   . High blood pressure Father   . Diabetes Sister   . Diabetes Mother   . Diabetes Brother   . Mental illness  Brother   . Alcohol abuse Paternal Uncle   . Alcohol abuse Paternal Grandfather   . Breast cancer Maternal Aunt   . Breast cancer Paternal Aunt   . High blood pressure Sister   . Mental illness Other     runs in family   Family Psychiatric  History:  Social History:  History  Alcohol Use No     History  Drug Use No    Social History   Social History  . Marital status: Single    Spouse name: N/A  . Number of children: N/A  . Years of education: N/A   Social History Main Topics  . Smoking status: Never Smoker  . Smokeless tobacco: Never Used  . Alcohol use No  . Drug use: No  . Sexual activity: Not Currently   Other Topics Concern  . None   Social History Narrative  . None    Sleep: Poor  Appetite:  Fair  Current Medications: Current Facility-Administered Medications  Medication Dose Route Frequency Provider Last Rate Last Dose  . acetaminophen (TYLENOL) tablet 650 mg  650 mg Oral Q4H PRN Dalia Heading, PA-C      . alum & mag hydroxide-simeth (MAALOX/MYLANTA) 200-200-20 MG/5ML suspension 30 mL  30 mL Oral PRN Dalia Heading, PA-C      . amLODipine (NORVASC) tablet 5 mg  5 mg Oral Daily Lajean Saver, MD   5 mg at 03/29/17 0904  . benztropine (  COGENTIN) tablet 0.5 mg  0.5 mg Oral QHS Lajean Saver, MD   Stopped at 03/27/17 2140  . lithium carbonate (ESKALITH) CR tablet 450 mg  450 mg Oral Q12H Lajean Saver, MD   450 mg at 03/29/17 0953  . ondansetron (ZOFRAN) tablet 4 mg  4 mg Oral Q8H PRN Dalia Heading, PA-C      . propranolol (INDERAL) tablet 10 mg  10 mg Oral TID Lajean Saver, MD   Stopped at 03/27/17 2142  . QUEtiapine (SEROQUEL XR) 24 hr tablet 600 mg  600 mg Oral QHS Lajean Saver, MD   Stopped at 03/27/17 2142   Current Outpatient Prescriptions  Medication Sig Dispense Refill  . amLODipine (NORVASC) 5 MG tablet TAKE 1 TABLET BY MOUTH EVERY DAY (Patient taking differently: TAKE 5 MG BY MOUTH EVERY DAY) 30 tablet 6  . benztropine (COGENTIN) 0.5  MG tablet Take 0.5 mg by mouth at bedtime.    . Biotin 1000 MCG CHEW Chew 1 capsule by mouth daily.    Marland Kitchen lithium carbonate (ESKALITH) 450 MG CR tablet Take 1 tablet (450 mg total) by mouth every 12 (twelve) hours. 60 tablet 0  . Multiple Vitamins-Minerals (CVS SPECTRAVITE ADULT 50+ PO) Take 1 tablet by mouth daily.    Marland Kitchen omega-3 acid ethyl esters (LOVAZA) 1 g capsule Take 1 g by mouth 3 (three) times daily.    . propranolol (INDERAL) 20 MG tablet Take 1 tablet (20 mg total) by mouth 3 (three) times daily. (Patient taking differently: Take 10 mg by mouth 3 (three) times daily. ) 90 tablet 0  . QUEtiapine (SEROQUEL XR) 300 MG 24 hr tablet Take 600 mg by mouth at bedtime.    Marland Kitchen QUEtiapine (SEROQUEL) 400 MG tablet Take 2 tablets (800 mg total) by mouth at bedtime. (Patient not taking: Reported on 03/24/2017) 60 tablet 0  . temazepam (RESTORIL) 30 MG capsule Take 1 capsule (30 mg total) by mouth at bedtime as needed for sleep. (Patient not taking: Reported on 02/13/2017) 30 capsule 0    Lab Results: No results found for this or any previous visit (from the past 48 hour(s)).  Blood Alcohol level:  Lab Results  Component Value Date   ETH <5 03/24/2017   ETH <5 11/29/2015    Physical Findings: AIMS:  , ,  ,  ,    CIWA:    COWS:     Musculoskeletal: Strength & Muscle Tone: within normal limits Gait & Station: normal Patient leans: N/A  Psychiatric Specialty Exam: Physical Exam  ROS  Blood pressure 140/80, pulse 90, temperature 98.3 F (36.8 C), temperature source Oral, resp. rate 18, SpO2 100 %.There is no height or weight on file to calculate BMI.  General Appearance: Casual  Eye Contact:  Good  Speech:  Pressured  Volume:  Increased  Mood:  Euphoric and Irritable  Affect:  Labile  Thought Process:  Disorganized  Orientation:  Full (Time, Place, and Person)  Thought Content:  Illogical and Paranoid Ideation  Suicidal Thoughts:  No  Homicidal Thoughts:  No  Memory:  Immediate;    Fair Recent;   Fair Remote;   Fair  Judgement:  Impaired  Insight:  Lacking  Psychomotor Activity:  Increased  Concentration:  Concentration: Poor and Attention Span: Poor  Recall:  [employer] of Knowledge:  Fair  Language:  Good  Akathisia:  No  Handed:  Right  AIMS (if indicated):     Assets:  Communication Skills  ADL's:  Intact  Cognition:  WNL  Sleep:   poor      Treatment Plan Summary: Daily contact with patient to assess and evaluate symptoms and progress in treatment and Medication management  Continue Seroquel XR 600mg  qhs for mood, Lithium XR 450 mg daily for Bipolar. Patient will benefit from inpatient admission for stabilization.  Corena Pilgrim, MD 03/29/2017, 10:25 AM

## 2017-03-29 NOTE — ED Notes (Signed)
Specimen cup given and pt encouraged to provide urine per MD order.

## 2017-03-29 NOTE — ED Notes (Signed)
Patient has remained paranoid thru out the shift. Patient refused to sleep in her assigned room during this shift. Patient states that she did not feel safe in her room. To help decrease patient anxiety level patient allowed to stay in dayroom. Patient continues to refuse medication states "I can't take any of those medication I have concerns and until I talk to the doctor I think it's  in my best interest not to take". Encouragement and support provided and safety maintain. Q 15 min safety checks remain in place.

## 2017-03-29 NOTE — Progress Notes (Signed)
03/29/17 1350:  LRT introduced self to pt and gave pt a rundown of the activities that were available.  Pt requested some bible theamed word searches and flower coloring sheets.  LRT printed out the sheets for pt.  Pt stated it was taking her a long time to do the word searches because she didn't have any glasses. Pt also stated she could get new glasses  Until August because that's when she has her check up.  [name], LRT/CTRS

## 2017-03-30 DIAGNOSIS — R45851 Suicidal ideations: Secondary | ICD-10-CM | POA: Diagnosis not present

## 2017-03-30 DIAGNOSIS — F22 Delusional disorders: Secondary | ICD-10-CM | POA: Diagnosis not present

## 2017-03-30 DIAGNOSIS — F312 Bipolar disorder, current episode manic severe with psychotic features: Secondary | ICD-10-CM | POA: Diagnosis not present

## 2017-03-30 DIAGNOSIS — Z818 Family history of other mental and behavioral disorders: Secondary | ICD-10-CM | POA: Diagnosis not present

## 2017-03-30 DIAGNOSIS — Z811 Family history of alcohol abuse and dependence: Secondary | ICD-10-CM | POA: Diagnosis not present

## 2017-03-30 MED ORDER — BENZTROPINE MESYLATE 0.5 MG PO TABS: 0.5000 mg | ORAL_TABLET | Freq: Every day | ORAL | 0 refills | Status: DC

## 2017-03-30 MED ORDER — LITHIUM CARBONATE ER 450 MG PO TBCR: 450.0000 mg | EXTENDED_RELEASE_TABLET | Freq: Two times a day (BID) | ORAL | 0 refills | Status: DC

## 2017-03-30 MED ORDER — QUETIAPINE FUMARATE ER 300 MG PO TB24: 600.0000 mg | ORAL_TABLET | Freq: Every day | ORAL | 0 refills | Status: DC

## 2017-03-30 NOTE — BH Assessment (Signed)
[facility] Assessment Progress Note  Per Corena Pilgrim, MD, this pt does not require psychiatric hospitalization at this time.  Pt presents under IVC initiated by her sister and upheld by Neita Garnet, MD, which Dr Darleene Cleaver has rescinded.  Pt is to be discharged from [facility] with recommendation to continue treatment with [facility].  This has been included in pt's discharge instructions.  Pt's nurse, Caryl Pina, has been notified.  Jalene Mullet, [place] Triage Specialist [phone]

## 2017-03-30 NOTE — Consult Note (Signed)
[facility] Face-to-Face Psychiatry Consult   Reason for Consult:  Mania Referring Physician:  EDP Patient Identification: Paula Dickson MRN:  [id number] Principal Diagnosis: Bipolar I disorder, most recent episode manic, severe with psychotic features [facility]) Diagnosis:   Patient Active Problem List   Diagnosis Date Noted  . Bipolar I disorder, most recent episode manic, severe with psychotic features ([place]) [F31.2] 06/14/2015    Priority: High  . Insomnia [G47.00] 12/27/2015  . Abnormal urinalysis [R82.90] 12/27/2015  . Hyperthyroidism [E05.90] 12/03/2015  . 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 female patient has stabilized  HPI:  Patient was very manic and agitated on admission.  Medications were restarted and she stabilized over the past few days.  Paula Dickson is no longer manic and thinking clearly, compliant with medications.  No suicidal/homicidal ideations, hallucinations, and alcohol/drug abuse.  She lives with her father and will return while following up with hr regular provider at [facility].  STable for discharge.    Past Psychiatric History: bipolar disorder  Risk to Self: None Risk to Others: NOne Prior Inpatient Therapy: Prior Inpatient Therapy:  (unk) Prior Therapy Dates: unk (n/a) Prior Therapy Facilty/Provider(s):  (unk) Reason for Treatment:  (unk) Prior Outpatient Therapy: Prior Outpatient Therapy:  (unk) Prior Therapy Dates: unk Prior Therapy Facilty/Provider(s):  (unk) Reason for Treatment:  (unk ) Does patient have an ACCT team?: Unknown Does patient have Intensive In-House Services?  : Unknown Does patient have [name] services? : Unknown Does patient have P4CC services?: Unknown  Past Medical History:  Past Medical History:  Diagnosis Date  . Bipolar affective disorder ([place])   . History of arthritis   . History of chicken pox   . History of depression   .  History of genital warts   . history of heart murmur   . History of high blood pressure   . History of thyroid disease   . History of UTI   . Hypertension   . Schizophrenia [facility])     Past Surgical History:  Procedure Laterality Date  . ABLATION ON ENDOMETRIOSIS    . CYST REMOVAL NECK     around 11 years ago /benign  . MULTIPLE TOOTH EXTRACTIONS     Family History:  Family History  Problem Relation Age of Onset  . Arthritis Father   . Hyperlipidemia Father   . High blood pressure Father   . Diabetes Sister   . Diabetes Mother   . Diabetes Brother   . Mental illness Brother   . Alcohol abuse Paternal Uncle   . Alcohol abuse Paternal Grandfather   . Breast cancer Maternal Aunt   . Breast cancer Paternal Aunt   . High blood pressure Sister   . Mental illness Other     runs in family   Family Psychiatric  History: unknown Social History:  History  Alcohol Use No     History  Drug Use No    Social History   Social History  . Marital status: Single    Spouse name: N/A  . Number of children: N/A  . Years of education: N/A   Social History Main Topics  . Smoking status: Never Smoker  . Smokeless tobacco: Never Used  . Alcohol use No  . Drug use: No  . Sexual activity: Not Currently   Other Topics Concern  . None   Social History Narrative  . None  Additional Social History:    Allergies:  No Known Allergies  Labs:  No results found for this or any previous visit (from the past 48 hour(s)).  Current Facility-Administered Medications  Medication Dose Route Frequency Provider Last Rate Last Dose  . acetaminophen (TYLENOL) tablet 650 mg  650 mg Oral Q4H PRN Dalia Heading, PA-C      . alum & mag hydroxide-simeth (MAALOX/MYLANTA) 200-200-20 MG/5ML suspension 30 mL  30 mL Oral PRN Dalia Heading, PA-C      . amLODipine (NORVASC) tablet 5 mg  5 mg Oral Daily Lajean Saver, MD   5 mg at 03/30/17 0941  . benztropine (COGENTIN) tablet 0.5 mg  0.5 mg  Oral QHS Lajean Saver, MD   Stopped at 03/27/17 2140  . lithium carbonate (ESKALITH) CR tablet 450 mg  450 mg Oral Q12H Lajean Saver, MD   450 mg at 03/30/17 0941  . ondansetron (ZOFRAN) tablet 4 mg  4 mg Oral Q8H PRN Dalia Heading, PA-C      . propranolol (INDERAL) tablet 10 mg  10 mg Oral TID Lajean Saver, MD   Stopped at 03/27/17 2142  . QUEtiapine (SEROQUEL XR) 24 hr tablet 600 mg  600 mg Oral QHS Lajean Saver, MD   Stopped at 03/27/17 2142   Current Outpatient Prescriptions  Medication Sig Dispense Refill  . amLODipine (NORVASC) 5 MG tablet TAKE 1 TABLET BY MOUTH EVERY DAY (Patient taking differently: TAKE 5 MG BY MOUTH EVERY DAY) 30 tablet 6  . benztropine (COGENTIN) 0.5 MG tablet Take 0.5 mg by mouth at bedtime.    . Biotin 1000 MCG CHEW Chew 1 capsule by mouth daily.    Marland Kitchen lithium carbonate (ESKALITH) 450 MG CR tablet Take 1 tablet (450 mg total) by mouth every 12 (twelve) hours. 60 tablet 0  . Multiple Vitamins-Minerals ([employer] SPECTRAVITE ADULT 50+ PO) Take 1 tablet by mouth daily.    Marland Kitchen omega-3 acid ethyl esters (LOVAZA) 1 g capsule Take 1 g by mouth 3 (three) times daily.    . propranolol (INDERAL) 20 MG tablet Take 1 tablet (20 mg total) by mouth 3 (three) times daily. (Patient taking differently: Take 10 mg by mouth 3 (three) times daily. ) 90 tablet 0  . QUEtiapine (SEROQUEL XR) 300 MG 24 hr tablet Take 600 mg by mouth at bedtime.    Marland Kitchen QUEtiapine (SEROQUEL) 400 MG tablet Take 2 tablets (800 mg total) by mouth at bedtime. (Patient not taking: Reported on 03/24/2017) 60 tablet 0  . temazepam (RESTORIL) 30 MG capsule Take 1 capsule (30 mg total) by mouth at bedtime as needed for sleep. (Patient not taking: Reported on 02/13/2017) 30 capsule 0    Musculoskeletal: Strength & Muscle Tone: within normal limits Gait & Station: normal Patient leans: N/A  Psychiatric Specialty Exam: Physical Exam  Constitutional: She is oriented to person, place, and time. She appears well-developed.   HENT:  Head: Normocephalic.  Neck: Normal range of motion.  Respiratory: Effort normal.  Musculoskeletal: Normal range of motion.  Neurological: She is alert and oriented to person, place, and time.  Psychiatric: She has a normal mood and affect. Her speech is normal and behavior is normal. Judgment and thought content normal. Cognition and memory are normal.    Review of Systems  Psychiatric/Behavioral: Negative.   All other systems reviewed and are negative.   Blood pressure (!) 173/76, pulse 95, temperature 98.1 F (36.7 C), temperature source Oral, resp. rate 18, SpO2 99 %.There is no height or  weight on file to calculate BMI.  General Appearance: Casual  Eye Contact:  Good  Speech:  Normal  Volume:  Normal  Mood:  Euthymic  Affect:  Congruent  Thought Process:  Normal  Orientation:  Alert and oriented times 3  Thought Content:  WDL, logical  Suicidal Thoughts:  No  Homicidal Thoughts:  No  Memory:  Immediate, good; recent, good; remote, good  Judgement:  Fair  Insight:  Fair  Psychomotor Activity:  Normal  Concentration:  Concentration and attention span:  Fair  Recall:  Good  Fund of Knowledge:  Fair  Language:  Good  Akathisia:  No  Handed:  Right  AIMS (if indicated):     Assets:  Leisure Time Physical Health Resilience  ADL's:  Intact  Cognition:  WDL  Sleep:        Treatment Plan Summary: Daily contact with patient to assess and evaluate symptoms and progress in treatment, Medication management and Plan bipolar affective disorder, mania, severe with psychosis:  -Crisis stabilization -Medication management:  Continued medical medications along with Cogentin 0.5 mg BID for EPS, Seroquel 600 mg at bedtime for sleep/psychosis, and Lithium 450 mg BID for mood stabilization.   -Individual counseling -Rx provided  Disposition: Stable for discharge  Waylan Boga, NP 03/30/2017 10:00 AM  Patient seen face-to-face for psychiatric evaluation, chart reviewed and  case discussed with the physician extender and developed treatment plan. Reviewed the information documented and agree with the treatment plan. Corena Pilgrim, MD

## 2017-03-30 NOTE — BHH Counselor (Signed)
Clininicain discussed pt's case with Rashell, RN and noted, pt's referral can be resubmitted to [facility], due to pth having 48 hours aggression free.    Edd Fabian, MS, [facility], [facility] Triage Specialist [phone]

## 2017-03-30 NOTE — BHH Suicide Risk Assessment (Signed)
Suicide Risk Assessment  Discharge Assessment   [facility] Discharge Suicide Risk Assessment   Principal Problem: Bipolar I disorder, most recent episode manic, severe with psychotic features [facility]) Discharge Diagnoses:  Patient Active Problem List   Diagnosis Date Noted  . Bipolar I disorder, most recent episode manic, severe with psychotic features ([place]) [F31.2] 06/14/2015    Priority: High  . Insomnia [G47.00] 12/27/2015  . Abnormal urinalysis [R82.90] 12/27/2015  . Hyperthyroidism [E05.90] 12/03/2015  . 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 She appears well-developed.  HENT:  Head: Normocephalic.  Neck: Normal range of motion.  Respiratory: Effort normal.  Musculoskeletal: Normal range of motion.  Neurological: She is alert and oriented to person, place, and time.  Psychiatric: She has a normal mood and affect. Her speech is normal and behavior is normal. Judgment and thought content normal. Cognition and memory are normal.    Review of Systems  Psychiatric/Behavioral: Negative.   All other systems reviewed and are negative.   Blood pressure (!) 173/76, pulse 95, temperature 98.1 F (36.7 C), temperature source Oral, resp. rate 18, SpO2 99 %.There is no height or weight on file to calculate BMI.  General Appearance: Casual  Eye Contact:  Good  Speech:  Normal  Volume:  Normal  Mood:  Euthymic  Affect:  Congruent  Thought Process:  Normal  Orientation:  Alert and oriented times 3  Thought Content:  WDL, logical  Suicidal Thoughts:  No  Homicidal Thoughts:  No  Memory:  Immediate, good; recent, good; remote, good  Judgement:  Fair  Insight:  Fair  Psychomotor Activity:  Normal  Concentration:  Concentration  and attention span:  Fair  Recall:  Good  Fund of Knowledge:  Fair  Language:  Good  Akathisia:  No  Handed:  Right  AIMS (if indicated):     Assets:  Leisure Time Physical Health Resilience  ADL's:  Intact  Cognition:  WDL  Sleep:       Mental Status Per Nursing Assessment::   On Admission:   mania  Demographic Factors:  NA  Loss Factors: NA  Historical Factors: NA  Risk Reduction Factors:   Sense of responsibility to family, Living with another person, especially a relative, Positive social support and Positive therapeutic relationship  Continued Clinical Symptoms:  None  Cognitive Features That Contribute To Risk:  None    Suicide Risk:  Minimal: No identifiable suicidal ideation.  Patients presenting with no risk factors but with morbid ruminations; may be classified as minimal risk based on the severity of the depressive symptoms    Plan Of Care/Follow-up recommendations:  Activity:  as tolerated Diet:  heart healthy diet  Paulino Cork, NP 03/30/2017, 10:08 AM

## 2017-03-30 NOTE — ED Notes (Signed)
This nurse consulted Erin-social worker in regards to pt d/c home.

## 2017-03-30 NOTE — ED Notes (Signed)
Pt d/c home per MD order. Discharge summary reviewed with pt. RX provided. Pt denies SI/HI/AVH. Pt refused to sign for personal property, pt refused to sign e-signature. Personal property returned to pt.  Pt ambulatory off unit with MHT and security. Pt sister in the lobby for discharge.

## 2017-03-30 NOTE — Progress Notes (Signed)
CSW contacted patients sister, [name] [phone], to assist with transportation. Patients sister is able to pick patient up. Patients sister stated she would contact CSW once arriving at hospital for pick up.   Kingsley Spittle, LCSWA Clinical Social Worker [phone]

## 2017-03-30 NOTE — ED Notes (Signed)
Patient refused vitals signs and to give urine sample. Patient states "I will wait until next shift to get vitals. Protocol for obtaining vitals was explained to patient that they need to be gotten twice on every shift and patient still continues to refuse.

## 2017-03-30 NOTE — Discharge Instructions (Signed)
For your ongoing behavioral health needs you are advised to continue treatment with [facility].  If you do not currently have an appointment, new patients are seen at their walk-in clinic.  Walk-in hours are Monday - Friday from 8:00 am - 12:00 pm, and from 1:00 pm - 3:00 pm.  Walk-in patients are seen on a first come, first served basis, so try to arrive as early as possible for the best chance of being seen the same day.  There is an initial fee of $22.50: ° °     [facility] °     [address] °     [place],  [address] °     [phone] °

## 2017-03-30 NOTE — Progress Notes (Signed)
03/30/17 1343:  LRT went to speak to pt about activities.  Pt stated she was waiting on her sister.  Pt seemed confused and kept flipping through her word searches and coloring sheets.  Pt was talking about what coloring sheets she was going to give to her nieces.  Pt also talked about giving copies of the word searches to her siblings.  [name], LRT/CTRS

## 2017-04-04 ENCOUNTER — Encounter (HOSPITAL_COMMUNITY): Payer: Self-pay | Admitting: Emergency Medicine

## 2017-04-04 ENCOUNTER — Emergency Department (HOSPITAL_COMMUNITY)
Admission: EM | Admit: 2017-04-04 | Discharge: 2017-04-06 | Disposition: A | Discharge status: Other Acute Inpatient Hospital | Payer: Medicare Other | Attending: Emergency Medicine | Admitting: Emergency Medicine

## 2017-04-04 DIAGNOSIS — Z818 Family history of other mental and behavioral disorders: Secondary | ICD-10-CM | POA: Diagnosis not present

## 2017-04-04 DIAGNOSIS — F302 Manic episode, severe with psychotic symptoms: Secondary | ICD-10-CM | POA: Diagnosis not present

## 2017-04-04 DIAGNOSIS — I10 Essential (primary) hypertension: Secondary | ICD-10-CM | POA: Diagnosis not present

## 2017-04-04 DIAGNOSIS — F312 Bipolar disorder, current episode manic severe with psychotic features: Secondary | ICD-10-CM | POA: Diagnosis present

## 2017-04-04 DIAGNOSIS — R451 Restlessness and agitation: Secondary | ICD-10-CM | POA: Diagnosis present

## 2017-04-04 DIAGNOSIS — Z811 Family history of alcohol abuse and dependence: Secondary | ICD-10-CM | POA: Diagnosis not present

## 2017-04-04 DIAGNOSIS — Z79899 Other long term (current) drug therapy: Secondary | ICD-10-CM | POA: Insufficient documentation

## 2017-04-04 LAB — COMPREHENSIVE METABOLIC PANEL
ALT: 35 U/L (ref 14–54)
AST: 28 U/L (ref 15–41)
Albumin: 4.2 g/dL (ref 3.5–5.0)
Alkaline Phosphatase: 61 U/L (ref 38–126)
Anion gap: 15 (ref 5–15)
BUN: 27 mg/dL — ABNORMAL HIGH (ref 6–20)
CO2: 21 mmol/L — ABNORMAL LOW (ref 22–32)
Calcium: 9.8 mg/dL (ref 8.9–10.3)
Chloride: 105 mmol/L (ref 101–111)
Creatinine, Ser: 1.24 mg/dL — ABNORMAL HIGH (ref 0.44–1.00)
GFR calc Af Amer: 56 mL/min — ABNORMAL LOW (ref >60)
GFR calc non Af Amer: 49 mL/min — ABNORMAL LOW (ref >60)
Glucose, Bld: 88 mg/dL (ref 65–99)
Potassium: 2.7 mmol/L — CL (ref 3.5–5.1)
Sodium: 141 mmol/L (ref 135–145)
Total Bilirubin: 1.4 mg/dL — ABNORMAL HIGH (ref 0.3–1.2)
Total Protein: 7.2 g/dL (ref 6.5–8.1)

## 2017-04-04 LAB — TSH: TSH: 0.016 u[IU]/mL — ABNORMAL LOW (ref 0.350–4.500)

## 2017-04-04 LAB — POTASSIUM: Potassium: 2.9 mmol/L — ABNORMAL LOW (ref 3.5–5.1)

## 2017-04-04 LAB — CBC WITH DIFFERENTIAL/PLATELET
Basophils Absolute: 0 10*3/uL (ref 0.0–0.1)
Basophils Relative: 0 %
Eosinophils Absolute: 0 10*3/uL (ref 0.0–0.7)
Eosinophils Relative: 1 %
HCT: 38.6 % (ref 36.0–46.0)
Hemoglobin: 13.2 g/dL (ref 12.0–15.0)
Lymphocytes Relative: 22 %
Lymphs Abs: 1.8 10*3/uL (ref 0.7–4.0)
MCH: 28.9 pg (ref 26.0–34.0)
MCHC: 34.2 g/dL (ref 30.0–36.0)
MCV: 84.6 fL (ref 78.0–100.0)
Monocytes Absolute: 0.9 10*3/uL (ref 0.1–1.0)
Monocytes Relative: 10 %
Neutro Abs: 5.5 10*3/uL (ref 1.7–7.7)
Neutrophils Relative %: 67 %
Platelets: 217 10*3/uL (ref 150–400)
RBC: 4.56 MIL/uL (ref 3.87–5.11)
RDW: 12.7 % (ref 11.5–15.5)
WBC: 8.2 10*3/uL (ref 4.0–10.5)

## 2017-04-04 LAB — ETHANOL: Alcohol, Ethyl (B): <5 mg/dL (ref <5)

## 2017-04-04 LAB — MAGNESIUM: Magnesium: 2.1 mg/dL (ref 1.7–2.4)

## 2017-04-04 LAB — SALICYLATE LEVEL: Salicylate Lvl: <7 mg/dL (ref 2.8–30.0)

## 2017-04-04 LAB — ACETAMINOPHEN LEVEL: Acetaminophen (Tylenol), Serum: <10 ug/mL — ABNORMAL LOW (ref 10–30)

## 2017-04-04 MED ORDER — HALOPERIDOL LACTATE 5 MG/ML IJ SOLN
5.0000 mg | Freq: Once | INTRAMUSCULAR | Status: AC
  Administered 2017-04-04: 5 mg via INTRAMUSCULAR
  Filled 2017-04-04: qty 1

## 2017-04-04 MED ORDER — POTASSIUM CHLORIDE CRYS ER 20 MEQ PO TBCR
20.0000 meq | EXTENDED_RELEASE_TABLET | Freq: Once | ORAL | Status: AC
  Administered 2017-04-04: 20 meq via ORAL
  Filled 2017-04-04: qty 1

## 2017-04-04 MED ORDER — POTASSIUM CHLORIDE CRYS ER 20 MEQ PO TBCR
40.0000 meq | EXTENDED_RELEASE_TABLET | Freq: Once | ORAL | Status: AC
  Administered 2017-04-04: 40 meq via ORAL
  Filled 2017-04-04: qty 2

## 2017-04-04 MED ORDER — ZIPRASIDONE MESYLATE 20 MG IM SOLR: 20.0000 mg | Freq: Once | INTRAMUSCULAR | Status: DC

## 2017-04-04 MED ORDER — DIPHENHYDRAMINE HCL 50 MG/ML IJ SOLN
25.0000 mg | Freq: Once | INTRAMUSCULAR | Status: AC
  Administered 2017-04-04: 25 mg via INTRAMUSCULAR
  Filled 2017-04-04: qty 1

## 2017-04-04 MED ORDER — LORAZEPAM 2 MG/ML IJ SOLN
1.0000 mg | Freq: Once | INTRAMUSCULAR | Status: AC
  Administered 2017-04-04: 1 mg via INTRAVENOUS
  Filled 2017-04-04: qty 1

## 2017-04-04 MED ORDER — LORAZEPAM 2 MG/ML IJ SOLN: 1.0000 mg | Freq: Once | INTRAMUSCULAR | Status: DC

## 2017-04-04 MED ORDER — POTASSIUM CHLORIDE 10 MEQ/100ML IV SOLN
10.0000 meq | Freq: Once | INTRAVENOUS | Status: DC
  Filled 2017-04-04: qty 100

## 2017-04-04 NOTE — ED Notes (Signed)
Patient states that the reason that she has not been able to eat, is because her mouth hurts from her braces.

## 2017-04-04 NOTE — ED Provider Notes (Signed)
[facility] DEPT Provider Note   CSN: [id number] Arrival date & time: 04/04/17  2025     History   Chief Complaint Chief Complaint  Patient presents with  . Not Eating    HPI Paula Dickson is a 51 y.o. female.  The history is provided by the police and medical records. No language interpreter was used.   Paula Dickson is a 51 y.o. female  with a PMH of bipolar disoder, schizophrenia, HTN, hyperthyroidism, psychogenic polydipsia who presents to the Emergency Department under IVC brought out by sister just prior to patient's arrival. Per IVC papers, patient has been non-compliant with medications. She has not eating, sleeping or taking care of basic personal hygiene such as brushing teeth or showering. IVC paperwork suggests that patient has been hearing voices, talking to people who were not there, walking in front of cars and acting aggressively towards her father whom she currently lives with. She has had extremely erratic behavior and family is concerned for both her safety and they're and safety. Upon my evaluation, patient will not answer simple questions, continually telling me to "hold on just a minute". I spoke with patient on multiple separate occasions and she continued to tell me was to hold on. She also spoke about only having one sock on, her orthodontist appointment back in December and feeling thirsty. History very limited due to this and no family present at bedside.   Level V caveat applies 2/2 mental status, psychiatric illness.   Past Medical History:  Diagnosis Date  . Bipolar affective disorder ([place])   . History of arthritis   . History of chicken pox   . History of depression   . History of genital warts   . history of heart murmur   . History of high blood pressure   . History of thyroid disease   . History of UTI   . Hypertension   . Schizophrenia [facility])     Patient Active Problem List   Diagnosis Date Noted  . Insomnia 12/27/2015  . Abnormal  urinalysis 12/27/2015  . Hyperthyroidism 12/03/2015  . 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 Bipolar I disorder, most recent episode manic, severe with psychotic features ([place]) 06/14/2015    Past Surgical History:  Procedure Laterality Date  . ABLATION ON ENDOMETRIOSIS    . CYST REMOVAL NECK     around 11 years ago /benign  . MULTIPLE TOOTH EXTRACTIONS      OB History    No data available       Home Medications    Prior to Admission medications   Medication Sig Start Date End Date Taking? Authorizing Provider  amLODipine (NORVASC) 5 MG tablet TAKE 1 TABLET BY MOUTH EVERY DAY Patient taking differently: TAKE 5 MG BY MOUTH EVERY DAY 02/14/16  Yes Lucille Passy, MD  benztropine (COGENTIN) 0.5 MG tablet Take 1 tablet (0.5 mg total) by mouth at bedtime. 03/30/17  Yes Patrecia Pour, NP  Biotin 1000 MCG CHEW Chew 1 capsule by mouth daily.   Yes [provider]  lithium carbonate (ESKALITH) 450 MG CR tablet Take 1 tablet (450 mg total) by mouth every 12 (twelve) hours. 03/30/17  Yes Patrecia Pour, NP  Multiple Vitamins-Minerals (CVS SPECTRAVITE ADULT 50+ PO) Take 1 tablet by mouth daily.   Yes [provider]  omega-3 acid ethyl esters (LOVAZA) 1 g capsule Take 1 g by mouth 3 (three) times daily.   Yes [provider]  propranolol (INDERAL) 20 MG tablet Take  1 tablet (20 mg total) by mouth 3 (three) times daily. Patient taking differently: Take 10 mg by mouth 3 (three) times daily.  12/20/15  Yes Pucilowska, Jolanta B, MD  QUEtiapine (SEROQUEL XR) 300 MG 24 hr tablet Take 2 tablets (600 mg total) by mouth at bedtime. 03/30/17  Yes Patrecia Pour, NP  QUEtiapine (SEROQUEL) 400 MG tablet Take 2 tablets (800 mg total) by mouth at bedtime. 12/20/15  Yes Pucilowska, Jolanta B, MD  temazepam (RESTORIL) 30 MG capsule Take 1 capsule (30 mg total) by mouth at bedtime as needed for sleep. 12/20/15  Yes Pucilowska, Wardell Honour, MD    Family History Family History  Problem  Relation Age of Onset  . Arthritis Father   . Hyperlipidemia Father   . High blood pressure Father   . Diabetes Sister   . Diabetes Mother   . Diabetes Brother   . Mental illness Brother   . Alcohol abuse Paternal Uncle   . Alcohol abuse Paternal Grandfather   . Breast cancer Maternal Aunt   . Breast cancer Paternal Aunt   . High blood pressure Sister   . Mental illness Other     runs in family    Social History Social History  Substance Use Topics  . Smoking status: Never Smoker  . Smokeless tobacco: Never Used  . Alcohol use No     Allergies   Patient has no known allergies.   Review of Systems Review of Systems  Unable to perform ROS: Psychiatric disorder  Psychiatric/Behavioral: Positive for agitation.     Physical Exam Updated Vital Signs BP (!) 162/92 (BP Location: Right Arm)   Pulse 92   Temp 98.4 F (36.9 C) (Oral)   Resp 16   SpO2 99%   Physical Exam  Constitutional: She is oriented to person, place, and time. She appears well-developed and well-nourished. No distress.  HENT:  Head: Normocephalic and atraumatic.  Cardiovascular: Normal rate, regular rhythm and normal heart sounds.   Pulmonary/Chest: Effort normal and breath sounds normal. No respiratory distress.  Musculoskeletal: Normal range of motion.  Neurological: She is alert and oriented to person, place, and time.  Skin: Skin is warm and dry.  Psychiatric:  Pacing the room. Agitated. Difficult to redirect.  Nursing note and vitals reviewed.    ED Treatments / Results  Labs (all labs ordered are listed, but only abnormal results are displayed) Labs Reviewed  ACETAMINOPHEN LEVEL - Abnormal; Notable for the following:       Result Value   Acetaminophen (Tylenol), Serum <10 (*)    All other components within normal limits  CBC WITH DIFFERENTIAL/PLATELET  ETHANOL  SALICYLATE LEVEL  COMPREHENSIVE METABOLIC PANEL  URINALYSIS, ROUTINE W REFLEX MICROSCOPIC  RAPID URINE DRUG SCREEN,  HOSP PERFORMED  LITHIUM LEVEL  TSH    EKG  EKG Interpretation None       Radiology No results found.  Procedures Procedures (including critical care time)  Medications Ordered in ED Medications  LORazepam (ATIVAN) injection 1 mg (not administered)  haloperidol lactate (HALDOL) injection 5 mg (5 mg Intramuscular Given 04/04/17 1311)  diphenhydrAMINE (BENADRYL) injection 25 mg (25 mg Intramuscular Given 04/04/17 1312)  LORazepam (ATIVAN) injection 1 mg (1 mg Intravenous Given 04/04/17 1314)     Initial Impression / Assessment and Plan / ED Course  I have reviewed the triage vital signs and the nursing notes.  Pertinent labs & imaging results that were available during my care of the patient were reviewed by me  and considered in my medical decision making (see chart for details).    Sahej Hauswirth is a 51 y.o. female who presents to ED under IVC by sister for non-compliance with medications, not eating / sleeping / taking care of personal hygiene. Upon initial evaluation, patient pacing the room, extremely agitated and will not cooperate with exam. She would not allow nursing staff to listen to heart/lungs or draw labs. Ativan, benadryl and haldol given.   Labs obtained and pending at shift change. Care assumed by oncoming provider PA Nadeau. Case discussed, plan agreed upon. Oncoming provider will follow up on labs. When medically cleared with need TTS consult.    Final Clinical Impressions(s) / ED Diagnoses   Final diagnoses:  None    New Prescriptions New Prescriptions   No medications on file     Ward, Ozella Almond, PA-C 04/04/17 1619    Gareth Morgan, MD 04/05/17 [id number]
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
## 2017-04-04 NOTE — ED Notes (Addendum)
Resting quietly at this time.

## 2017-04-04 NOTE — ED Notes (Signed)
Pt refuses to allow RN to listen to heart and lungs.

## 2017-04-04 NOTE — ED Notes (Signed)
Bed: [facility] Expected date:  Expected time:  Means of arrival:  Comments: 

## 2017-04-04 NOTE — ED Notes (Signed)
A sheriff presented pt with IVC papers

## 2017-04-04 NOTE — ED Triage Notes (Signed)
Pt brought by family member for anorexia, not taking medications, trashing her house.

## 2017-04-04 NOTE — ED Notes (Signed)
Called for pt 3 times to bring back to room. No response.

## 2017-04-04 NOTE — ED Notes (Signed)
Pt refused vital signs. Explained that ED staff cannot properly diagnose and treat pt without vital signs.

## 2017-04-04 NOTE — ED Notes (Signed)
Patient placed in purple scrubs and wanded per security.

## 2017-04-04 NOTE — ED Notes (Signed)
Bed: WA28 Expected date:  Expected time:  Means of arrival:  Comments: 

## 2017-04-04 NOTE — BHH Counselor (Signed)
Clinician referred the pt to the following inpatient treatment facilities:   [facility] will continue to follow up with collaterals and seek placement.   Edd Fabian, MS, [facility], [facility] Triage Specialist [phone]

## 2017-04-04 NOTE — ED Notes (Signed)
Main lab called for lab collect

## 2017-04-04 NOTE — ED Provider Notes (Signed)
Hand-off from [facility], PA-C. Psych evaluation pending CMP and TSH.   See initial provider's notes for full HPI.   Briefly, Paula Dickson is a 51 yo female with PMH of bipolar disorder, schizophrenia, HTN, hyperthyroidism, psychogenic polydipsia who presents to the ED under IVC. IVC papers report Paula Dickson has been noncompliant with meds. Paula Dickson nor eating, sleeping, caring for self and hearing voices, acting aggressive towards family.   Plan to consult TTS once CMP and TSH resulted and Paula Dickson is medically cleared.   On my initial evaluation, Paula Dickson sleeping, resting in no acute distress. VSS. Labs showed K 2.7. TSH 0.016. Paula Dickson without evidence of thyroid storm, VSS. Paula Dickson given oral potassium supplement in the ED. Plan to recheck potassium prior to consulting TTS.   Repeat potassium increased to 2.9. Consulted TTS. Behavioral health recommends inpatient tx. Seeking placement.    Nona Dell, PA-C 04/04/17 2242    Lacretia Leigh, MD 04/05/17 1725

## 2017-04-04 NOTE — ED Notes (Signed)
Unable to complete patients shift psych assessment at this time. Patient sleeping and not able to wake up for assessment at this time. Even rise and fall of chest, eyes closed, NAD noted

## 2017-04-04 NOTE — ED Notes (Signed)
Unable to obtain bloodwork

## 2017-04-04 NOTE — ED Notes (Signed)
Pt not cooperative for blood draw at this time.

## 2017-04-05 DIAGNOSIS — F312 Bipolar disorder, current episode manic severe with psychotic features: Secondary | ICD-10-CM | POA: Diagnosis not present

## 2017-04-05 DIAGNOSIS — Z811 Family history of alcohol abuse and dependence: Secondary | ICD-10-CM

## 2017-04-05 DIAGNOSIS — Z818 Family history of other mental and behavioral disorders: Secondary | ICD-10-CM | POA: Diagnosis not present

## 2017-04-05 MED ORDER — BENZTROPINE MESYLATE 0.5 MG PO TABS: 0.5000 mg | ORAL_TABLET | Freq: Every day | ORAL | Status: DC

## 2017-04-05 MED ORDER — PROPRANOLOL HCL 10 MG PO TABS
10.0000 mg | ORAL_TABLET | Freq: Three times a day (TID) | ORAL | Status: DC
  Filled 2017-04-05 (×5): qty 1

## 2017-04-05 MED ORDER — LITHIUM CARBONATE ER 450 MG PO TBCR
450.0000 mg | EXTENDED_RELEASE_TABLET | Freq: Two times a day (BID) | ORAL | Status: DC
  Filled 2017-04-05 (×2): qty 1

## 2017-04-05 MED ORDER — RISPERIDONE 1 MG PO TABS: 1.0000 mg | ORAL_TABLET | Freq: Every day | ORAL | Status: DC

## 2017-04-05 NOTE — ED Notes (Signed)
Pt requesting a wash cloth. This nurse attempted to hand pt wash cloth,during the exchange; pt  allowed it to fall on floor, this nurse picked wash cloth off the floor and put it in hamper, Pt then aggressively grabbed hamper, pt behavior bizarre.  This nurse gave pt another wash cloth, pt then went to her room.

## 2017-04-05 NOTE — Progress Notes (Signed)
04/05/17 1402:  LRT went to pt room to offer activities, pt declined.  [name], LRT/CTRS

## 2017-04-05 NOTE — Progress Notes (Signed)
Per Barnabas Lister, HH Intake, Pt. Accepted to [facility] for admission on 04/06/17. Dr. Mina Marble accepting Call report to [phone] Patient to arrive at 10 AM.  [facility] ED Nurse, Bethena Roys, notified.  Areatha Keas. Judi Cong, MSW, [place] Work Disposition [phone]

## 2017-04-05 NOTE — ED Notes (Signed)
Pt refusing prescribed medication. Pt presents with paranoia, demanding, difficult to redirect. Heloise Purpura, NP notified of pt behavior and medication refusal. Encouragement and support provided. Special checks q 15 mins in place for safety, Video monitoring in place. Will continue to monitor.

## 2017-04-05 NOTE — BH Assessment (Signed)
[place] Assessment Progress Note  Per Corena Pilgrim, MD, this pt requires psychiatric hospitalization at this time.  The following facilities have been contacted to seek placement for this pt, with results as noted:  Beds available, information sent, decision pending:  [place]   At capacity:  Mikel Cella [place], [place] Triage Specialist [phone]

## 2017-04-05 NOTE — ED Notes (Signed)
SBAR Report received from previous nurse. Pt received calm and visible on unit. Pt gave no meaningful answer to assessment questions related to  current SI/ HI, A/V H, depression, anxiety, or pain at this time, but appears otherwise stable and free of distress. Pt reminded of camera surveillance, q 15 min rounds, and rules of the milieu. Will continue to assess.

## 2017-04-05 NOTE — ED Notes (Signed)
Patient denies SI,HI and AVH a this time. Plan of care discussed. Encouragement and support provided and safety maintain. Q 15 min safety checks in place and video monitoring.
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
## 2017-04-06 DIAGNOSIS — F209 Schizophrenia, unspecified: Secondary | ICD-10-CM | POA: Diagnosis not present

## 2017-04-06 DIAGNOSIS — F250 Schizoaffective disorder, bipolar type: Secondary | ICD-10-CM | POA: Diagnosis not present

## 2017-04-06 DIAGNOSIS — F259 Schizoaffective disorder, unspecified: Secondary | ICD-10-CM | POA: Diagnosis present

## 2017-04-06 DIAGNOSIS — E079 Disorder of thyroid, unspecified: Secondary | ICD-10-CM | POA: Diagnosis present

## 2017-04-06 DIAGNOSIS — M1990 Unspecified osteoarthritis, unspecified site: Secondary | ICD-10-CM | POA: Diagnosis present

## 2017-04-06 DIAGNOSIS — Z9119 Patient's noncompliance with other medical treatment and regimen: Secondary | ICD-10-CM | POA: Diagnosis not present

## 2017-04-06 DIAGNOSIS — R609 Edema, unspecified: Secondary | ICD-10-CM | POA: Diagnosis not present

## 2017-04-06 DIAGNOSIS — E878 Other disorders of electrolyte and fluid balance, not elsewhere classified: Secondary | ICD-10-CM | POA: Diagnosis not present

## 2017-04-06 DIAGNOSIS — Z781 Physical restraint status: Secondary | ICD-10-CM | POA: Diagnosis not present

## 2017-04-06 DIAGNOSIS — I10 Essential (primary) hypertension: Secondary | ICD-10-CM | POA: Diagnosis not present

## 2017-04-06 NOTE — ED Notes (Signed)
Sheriff on unit to transport pt to [facility] per MD order.Personal property given to sheriff for transport. Pt presents with paranoia, uncooperative. Pt ambulatory off unit with sheriff and security.

## 2017-04-06 NOTE — ED Notes (Signed)
This nurse notified Jameson,NP of pt VS.

## 2017-05-25 DIAGNOSIS — F209 Schizophrenia, unspecified: Secondary | ICD-10-CM | POA: Diagnosis not present

## 2017-05-31 DIAGNOSIS — Z79899 Other long term (current) drug therapy: Secondary | ICD-10-CM | POA: Diagnosis not present

## 2017-05-31 DIAGNOSIS — M79604 Pain in right leg: Secondary | ICD-10-CM | POA: Diagnosis not present

## 2017-05-31 DIAGNOSIS — I10 Essential (primary) hypertension: Secondary | ICD-10-CM | POA: Diagnosis not present

## 2017-05-31 DIAGNOSIS — R0602 Shortness of breath: Secondary | ICD-10-CM | POA: Diagnosis not present

## 2017-05-31 DIAGNOSIS — E7800 Pure hypercholesterolemia, unspecified: Secondary | ICD-10-CM | POA: Diagnosis not present

## 2017-05-31 DIAGNOSIS — E559 Vitamin D deficiency, unspecified: Secondary | ICD-10-CM | POA: Diagnosis not present

## 2017-05-31 DIAGNOSIS — R5383 Other fatigue: Secondary | ICD-10-CM | POA: Diagnosis not present

## 2017-05-31 DIAGNOSIS — Z0000 Encounter for general adult medical examination without abnormal findings: Secondary | ICD-10-CM | POA: Diagnosis not present

## 2017-05-31 DIAGNOSIS — M79605 Pain in left leg: Secondary | ICD-10-CM | POA: Diagnosis not present

## 2017-05-31 DIAGNOSIS — R739 Hyperglycemia, unspecified: Secondary | ICD-10-CM | POA: Diagnosis not present

## 2017-06-05 ENCOUNTER — Encounter: Payer: Self-pay | Admitting: Gastroenterology

## 2017-06-06 ENCOUNTER — Ambulatory Visit
Admission: RE | Admit: 2017-06-06 | Discharge: 2017-06-06 | Disposition: A | Discharge status: Home / Self Care | Payer: Medicare Other | Source: Ambulatory Visit | Attending: Internal Medicine | Admitting: Internal Medicine

## 2017-06-06 DIAGNOSIS — Z1231 Encounter for screening mammogram for malignant neoplasm of breast: Secondary | ICD-10-CM

## 2017-06-11 DIAGNOSIS — Z1389 Encounter for screening for other disorder: Secondary | ICD-10-CM | POA: Diagnosis not present

## 2017-06-11 DIAGNOSIS — E7800 Pure hypercholesterolemia, unspecified: Secondary | ICD-10-CM | POA: Diagnosis not present

## 2017-06-11 DIAGNOSIS — R7303 Prediabetes: Secondary | ICD-10-CM | POA: Diagnosis not present

## 2017-06-11 DIAGNOSIS — I10 Essential (primary) hypertension: Secondary | ICD-10-CM | POA: Diagnosis not present

## 2017-06-12 DIAGNOSIS — F209 Schizophrenia, unspecified: Secondary | ICD-10-CM | POA: Diagnosis not present

## 2017-06-14 DIAGNOSIS — F250 Schizoaffective disorder, bipolar type: Secondary | ICD-10-CM | POA: Diagnosis not present

## 2017-06-25 DIAGNOSIS — F250 Schizoaffective disorder, bipolar type: Secondary | ICD-10-CM | POA: Diagnosis not present

## 2017-07-11 ENCOUNTER — Encounter (HOSPITAL_COMMUNITY): Payer: Self-pay | Admitting: Family Medicine

## 2017-07-11 ENCOUNTER — Emergency Department (HOSPITAL_COMMUNITY): Payer: Medicare Other

## 2017-07-11 ENCOUNTER — Inpatient Hospital Stay (HOSPITAL_COMMUNITY)
Admission: EM | Admit: 2017-07-11 | Discharge: 2017-07-17 | DRG: 917 | Disposition: A | Discharge status: Other Acute Inpatient Hospital | Payer: Medicare Other | Attending: Internal Medicine | Admitting: Internal Medicine

## 2017-07-11 DIAGNOSIS — R7989 Other specified abnormal findings of blood chemistry: Secondary | ICD-10-CM

## 2017-07-11 DIAGNOSIS — Z8249 Family history of ischemic heart disease and other diseases of the circulatory system: Secondary | ICD-10-CM

## 2017-07-11 DIAGNOSIS — E7220 Disorder of urea cycle metabolism, unspecified: Secondary | ICD-10-CM | POA: Diagnosis not present

## 2017-07-11 DIAGNOSIS — T50991A Poisoning by other drugs, medicaments and biological substances, accidental (unintentional), initial encounter: Secondary | ICD-10-CM | POA: Diagnosis not present

## 2017-07-11 DIAGNOSIS — T426X2A Poisoning by other antiepileptic and sedative-hypnotic drugs, intentional self-harm, initial encounter: Principal | ICD-10-CM | POA: Diagnosis present

## 2017-07-11 DIAGNOSIS — G92 Toxic encephalopathy: Secondary | ICD-10-CM | POA: Diagnosis present

## 2017-07-11 DIAGNOSIS — F3130 Bipolar disorder, current episode depressed, mild or moderate severity, unspecified: Secondary | ICD-10-CM

## 2017-07-11 DIAGNOSIS — T50901A Poisoning by unspecified drugs, medicaments and biological substances, accidental (unintentional), initial encounter: Secondary | ICD-10-CM

## 2017-07-11 DIAGNOSIS — T426X1A Poisoning by other antiepileptic and sedative-hypnotic drugs, accidental (unintentional), initial encounter: Secondary | ICD-10-CM | POA: Diagnosis present

## 2017-07-11 DIAGNOSIS — Y92009 Unspecified place in unspecified non-institutional (private) residence as the place of occurrence of the external cause: Secondary | ICD-10-CM

## 2017-07-11 DIAGNOSIS — R4182 Altered mental status, unspecified: Secondary | ICD-10-CM | POA: Diagnosis not present

## 2017-07-11 DIAGNOSIS — R569 Unspecified convulsions: Secondary | ICD-10-CM | POA: Diagnosis present

## 2017-07-11 DIAGNOSIS — F319 Bipolar disorder, unspecified: Secondary | ICD-10-CM | POA: Diagnosis not present

## 2017-07-11 DIAGNOSIS — F209 Schizophrenia, unspecified: Secondary | ICD-10-CM

## 2017-07-11 DIAGNOSIS — I10 Essential (primary) hypertension: Secondary | ICD-10-CM | POA: Diagnosis present

## 2017-07-11 DIAGNOSIS — T1491XA Suicide attempt, initial encounter: Secondary | ICD-10-CM

## 2017-07-11 DIAGNOSIS — G9340 Encephalopathy, unspecified: Secondary | ICD-10-CM | POA: Diagnosis not present

## 2017-07-11 DIAGNOSIS — E871 Hypo-osmolality and hyponatremia: Secondary | ICD-10-CM | POA: Diagnosis not present

## 2017-07-11 DIAGNOSIS — T4391XA Poisoning by unspecified psychotropic drug, accidental (unintentional), initial encounter: Secondary | ICD-10-CM | POA: Diagnosis present

## 2017-07-11 DIAGNOSIS — M1990 Unspecified osteoarthritis, unspecified site: Secondary | ICD-10-CM | POA: Diagnosis present

## 2017-07-11 DIAGNOSIS — R262 Difficulty in walking, not elsewhere classified: Secondary | ICD-10-CM | POA: Diagnosis present

## 2017-07-11 DIAGNOSIS — R000 Tachycardia, unspecified: Secondary | ICD-10-CM | POA: Diagnosis present

## 2017-07-11 DIAGNOSIS — Z79899 Other long term (current) drug therapy: Secondary | ICD-10-CM

## 2017-07-11 DIAGNOSIS — E876 Hypokalemia: Secondary | ICD-10-CM | POA: Diagnosis present

## 2017-07-11 DIAGNOSIS — E0590 Thyrotoxicosis, unspecified without thyrotoxic crisis or storm: Secondary | ICD-10-CM | POA: Diagnosis present

## 2017-07-11 DIAGNOSIS — G929 Unspecified toxic encephalopathy: Secondary | ICD-10-CM

## 2017-07-11 DIAGNOSIS — Z818 Family history of other mental and behavioral disorders: Secondary | ICD-10-CM

## 2017-07-11 DIAGNOSIS — W1830XA Fall on same level, unspecified, initial encounter: Secondary | ICD-10-CM | POA: Diagnosis present

## 2017-07-11 HISTORY — DX: Other specified abnormal findings of blood chemistry: R79.89

## 2017-07-11 LAB — CBG MONITORING, ED: Glucose-Capillary: 123 mg/dL — ABNORMAL HIGH (ref 65–99)

## 2017-07-11 MED ORDER — SODIUM CHLORIDE 0.9 % IV BOLUS (SEPSIS)
1000.0000 mL | Freq: Once | INTRAVENOUS | Status: AC
  Administered 2017-07-12: 1000 mL via INTRAVENOUS

## 2017-07-11 NOTE — ED Notes (Signed)
EKG given to EDP,Nanavati,MD., for review. 

## 2017-07-11 NOTE — ED Triage Notes (Signed)
Patient has been brought in by patients siblings due to possible overdose. Patient lives with father. Patients sister reports patient came to her father, hardly unable to walk, told her father she had took a whole of her medication. Patient did fall while in the home. Patients father called for her siblings to come bring her to the hospital. Patient is alert, respirations are even, regular, and unlabored. However, patient is mute and confused. Patient does not make eye contact with staff.

## 2017-07-11 NOTE — ED Provider Notes (Signed)
[facility] DEPT Provider Note   CSN: [id number] Arrival date & time: 07/11/17  2240  By signing my name below, I, Marcello Moores, attest that this documentation has been prepared under the direction and in the presence of Varney Biles, MD. Electronically Signed: Marcello Moores, ED Scribe. 07/11/17. 11:37 PM.  History   Chief Complaint Chief Complaint  Patient presents with  . Drug Overdose   The history is provided by a relative. History limited by: mental status. No language interpreter was used.   LEVEL 5 CAVEAT: HPI and ROS limited due to mental status change.  HPI Comments: Paula Dickson is a 51 y.o. female with a h/x of depression and schizophrenia, who presents to the Emergency Department complaining of a sudden onset of persistent, severe difficulty with ambulation and a speech difficulty due to a possible drug overdose that occurred around 6:30pm today. The pt has an associated mild tremor. Per sister, the pt reported that she took "a whole lot of her medication" around 5:00pm today. Father then called the pt's siblings to escort her to the ED. Sister states that the pt was unable to ambulate or answer direct questions. She also states that the pt fell while leaving the house and trying to enter the car. The pt has not had similar symptoms in the past, per relative. The pt has PMHx of HTN and bipolar affective disorder per chart. She currently lives at home with her father. There is no fever.   Past Medical History:  Diagnosis Date  . Bipolar affective disorder ([place])   . History of arthritis   . History of chicken pox   . History of depression   . History of genital warts   . history of heart murmur   . History of high blood pressure   . History of thyroid disease   . History of UTI   . Hypertension   . Schizophrenia [facility])     Patient Active Problem List   Diagnosis Date Noted  . Insomnia 12/27/2015  . Abnormal urinalysis 12/27/2015  . Hyperthyroidism  12/03/2015  . 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 Bipolar I disorder, most recent episode manic, severe with psychotic features ([place]) 06/14/2015    Past Surgical History:  Procedure Laterality Date  . ABLATION ON ENDOMETRIOSIS    . CYST REMOVAL NECK     around 11 years ago /benign  . MULTIPLE TOOTH EXTRACTIONS      OB History    No data available       Home Medications    Prior to Admission medications   Medication Sig Start Date End Date Taking? Authorizing Provider  amLODipine (NORVASC) 5 MG tablet TAKE 1 TABLET BY MOUTH EVERY DAY Patient taking differently: TAKE 5 MG BY MOUTH EVERY DAY 02/14/16  Yes Lucille Passy, MD  divalproex (DEPAKOTE SPRINKLE) 125 MG capsule Take 4 capsules by mouth 2 (two) times daily. 06/14/17  Yes [provider]  OLANZapine (ZYPREXA) 20 MG tablet Take 20 mg by mouth daily. 06/13/17  Yes [provider]  benztropine (COGENTIN) 0.5 MG tablet Take 1 tablet (0.5 mg total) by mouth at bedtime. Patient not taking: Reported on 07/11/2017 03/30/17   Patrecia Pour, NP  lithium carbonate (ESKALITH) 450 MG CR tablet Take 1 tablet (450 mg total) by mouth every 12 (twelve) hours. Patient not taking: Reported on 07/11/2017 03/30/17   Patrecia Pour, NP  propranolol (INDERAL) 20 MG tablet Take 1 tablet (20 mg total) by mouth 3 (three) times daily.  Patient taking differently: Take 10 mg by mouth 3 (three) times daily.  12/20/15   Pucilowska, Herma Ard B, MD  QUEtiapine (SEROQUEL XR) 300 MG 24 hr tablet Take 2 tablets (600 mg total) by mouth at bedtime. Patient not taking: Reported on 07/11/2017 03/30/17   Patrecia Pour, NP    Family History Family History  Problem Relation Age of Onset  . Arthritis Father   . Hyperlipidemia Father   . High blood pressure Father   . Diabetes Sister   . Diabetes Mother   . Diabetes Brother   . Mental illness Brother   . Alcohol abuse Paternal Uncle   . Alcohol abuse Paternal Grandfather   . Breast cancer Maternal  Aunt   . Breast cancer Paternal Aunt   . High blood pressure Sister   . Mental illness Other        runs in family    Social History Social History  Substance Use Topics  . Smoking status: Never Smoker  . Smokeless tobacco: Never Used  . Alcohol use No     Allergies   Patient has no known allergies.   Review of Systems Review of Systems  Unable to perform ROS: Mental status change  Neurological: Positive for tremors and speech difficulty.  All other systems reviewed and are negative.    Physical Exam Updated Vital Signs BP 121/87   Pulse 84   Temp 99.2 F (37.3 C) (Rectal)   Resp (!) 21   SpO2 98%   Physical Exam  Constitutional: She appears well-developed and well-nourished. No distress.  HENT:  Head: Normocephalic and atraumatic.  Nose: Nose normal.  Mouth/Throat: Oropharynx is clear and moist.  Eyes: Conjunctivae and EOM are normal. Left eye exhibits no discharge. No scleral icterus.  Pupils are 59mm and equal.  Neck: Normal range of motion. Neck supple.  Cardiovascular: Regular rhythm, normal heart sounds and intact distal pulses.  Tachycardia present.  Exam reveals no gallop and no friction rub.   No murmur heard. Pulmonary/Chest: Effort normal and breath sounds normal. No respiratory distress. She has no wheezes. She has no rales.  Abdominal: Soft. Bowel sounds are normal. She exhibits no distension. There is no tenderness. There is no guarding.  Musculoskeletal: Normal range of motion. She exhibits no edema.  Neurological: She is alert. She is disoriented. She displays tremor. No sensory deficit. She exhibits normal muscle tone. Coordination normal.  Pt appears disoriented. Unable to follow commands. Seems to be moving all 4 extremities with a slight tremor over the left upper extremity. No clear disannuls appreciated. Gross sensory exam appears normal.  Skin: Skin is warm and dry. No rash noted.  Axilla appears dry.  Psychiatric: She has a normal mood and  affect.  Nursing note and vitals reviewed.    ED Treatments / Results   DIAGNOSTIC STUDIES: Oxygen Saturation is 96% on RA, normal by my interpretation.   COORDINATION OF CARE: 11:19 PM-Discussed next steps with pt.  Labs (all labs ordered are listed, but only abnormal results are displayed) Labs Reviewed  COMPREHENSIVE METABOLIC PANEL - Abnormal; Notable for the following:       Result Value   Sodium 133 (*)    Potassium 3.2 (*)    Chloride 96 (*)    Glucose, Bld 124 (*)    Creatinine, Ser 1.13 (*)    Calcium 10.7 (*)    GFR calc non Af Amer 54 (*)    All other components within normal limits  ACETAMINOPHEN LEVEL -  Abnormal; Notable for the following:    Acetaminophen (Tylenol), Serum <10 (*)    All other components within normal limits  LITHIUM LEVEL - Abnormal; Notable for the following:    Lithium Lvl <0.06 (*)    All other components within normal limits  BLOOD GAS, VENOUS - Abnormal; Notable for the following:    pCO2, Ven 41.5 (*)    Acid-Base Excess 2.2 (*)    All other components within normal limits  VALPROIC ACID LEVEL - Abnormal; Notable for the following:    Valproic Acid Lvl 158 (*)    All other components within normal limits  AMMONIA - Abnormal; Notable for the following:    Ammonia 37 (*)    All other components within normal limits  CBG MONITORING, ED - Abnormal; Notable for the following:    Glucose-Capillary 123 (*)    All other components within normal limits  ETHANOL  SALICYLATE LEVEL  CBC  TROPONIN I  MAGNESIUM  PHOSPHORUS  PROTIME-INR  RAPID URINE DRUG SCREEN, HOSP PERFORMED  VALPROIC ACID LEVEL  I-STAT CHEM 8, ED  I-STAT CHEM 8, ED    EKG  EKG Interpretation None     ED ECG REPORT   Date: 07/12/2017  Rate: 132  Rhythm: normal sinus rhythm  QRS Axis: normal  Intervals: normal  ST/T Wave abnormalities: nonspecific ST/T changes  Conduction Disutrbances:none  Narrative Interpretation:   Old EKG Reviewed: none  available  I have personally reviewed the EKG tracing and agree with the computerized printout as noted.    Radiology Ct Head Wo Contrast  Result Date: 07/12/2017 CLINICAL DATA:  Acute onset of altered level of consciousness. Difficulty with ambulation and speech. Initial encounter. EXAM: CT HEAD WITHOUT CONTRAST TECHNIQUE: Contiguous axial images were obtained from the base of the skull through the vertex without intravenous contrast. COMPARISON:  None. FINDINGS: Brain: No evidence of acute infarction, hemorrhage, hydrocephalus, extra-axial collection or mass lesion/mass effect. The posterior fossa, including the cerebellum, brainstem and fourth ventricle, is within normal limits. The third and lateral ventricles, and basal ganglia are unremarkable in appearance. The cerebral hemispheres are symmetric in appearance, with normal gray-white differentiation. No mass effect or midline shift is seen. Vascular: No hyperdense vessel or unexpected calcification. Skull: There is no evidence of fracture; visualized osseous structures are unremarkable in appearance. Sinuses/Orbits: The orbits are within normal limits. The paranasal sinuses and mastoid air cells are well-aerated. Other: No significant soft tissue abnormalities are seen. IMPRESSION: Unremarkable noncontrast CT of the head. Electronically Signed   By: Garald Balding M.D.   On: 07/12/2017 02:44    Procedures Procedures (including critical care time)  CRITICAL CARE Performed by: Varney Biles   Total critical care time: 105 minutes  Critical care time was exclusive of separately billable procedures and treating other patients.  Critical care was necessary to treat or prevent imminent or life-threatening deterioration.  Critical care was time spent personally by me on the following activities: development of treatment plan with patient and/or surrogate as well as nursing, discussions with consultants, evaluation of patient's response to  treatment, examination of patient, obtaining history from patient or surrogate, ordering and performing treatments and interventions, ordering and review of laboratory studies, ordering and review of radiographic studies, pulse oximetry and re-evaluation of patient's condition.    Medications Ordered in ED Medications  sodium chloride 0.9 % bolus 1,000 mL (0 mLs Intravenous Stopped 07/12/17 0135)  LORazepam (ATIVAN) injection 1 mg (1 mg Intravenous Given 07/12/17 0110)  Initial Impression / Assessment and Plan / ED Course  I have reviewed the triage vital signs and the nursing notes.  Pertinent labs & imaging results that were available during my care of the patient were reviewed by me and considered in my medical decision making (see chart for details).  Clinical Course as of Jul 12 624  Thu Jul 12, 2017  0015 Pt reassessed. Still in restraints for now  [AN]  0131 Continue restraints. Labs reviewed. Depakote level is high.  [AN]  0530 Restraints continued for now.  [AN]  [id number] Restraint reassessment competed  [AN]  4315 Spoke with poison control. Not a clear picture of specific tox syndrome. They recommend repeat depakote level and admitting pt for supportive care.  [AN]    Clinical Course User Index [AN] Varney Biles, MD    Pt comes in with cc of AMS and overdose. Pt is noted to be having a slight tremor, she is disoriented, there is tachycardia with moderately dilated pupils. No nystagmus. Pt is not answering any questions. Unknown ingestion time or drugs that were ingested. Seen to be abnormal between 4p-5p. Pt is on lithium, depakote, seroquel and zyprexa. Intervals on ekg is fine.  Benzo given. Labs ordered.   Final Clinical Impressions(s) / ED Diagnoses   Final diagnoses:  Accidental drug overdose, initial encounter  Acute encephalopathy    New Prescriptions New Prescriptions   No medications on file   I personally performed the services described in this  documentation, which was scribed in my presence. The recorded information has been reviewed and is accurate.     Varney Biles, MD 07/12/17 [id number]

## 2017-07-12 ENCOUNTER — Encounter (HOSPITAL_COMMUNITY): Payer: Self-pay | Admitting: Internal Medicine

## 2017-07-12 DIAGNOSIS — Y92009 Unspecified place in unspecified non-institutional (private) residence as the place of occurrence of the external cause: Secondary | ICD-10-CM | POA: Diagnosis not present

## 2017-07-12 DIAGNOSIS — E871 Hypo-osmolality and hyponatremia: Secondary | ICD-10-CM | POA: Diagnosis not present

## 2017-07-12 DIAGNOSIS — Z79899 Other long term (current) drug therapy: Secondary | ICD-10-CM | POA: Diagnosis not present

## 2017-07-12 DIAGNOSIS — F3130 Bipolar disorder, current episode depressed, mild or moderate severity, unspecified: Secondary | ICD-10-CM

## 2017-07-12 DIAGNOSIS — T4394XA Poisoning by unspecified psychotropic drug, undetermined, initial encounter: Secondary | ICD-10-CM

## 2017-07-12 DIAGNOSIS — G92 Toxic encephalopathy: Secondary | ICD-10-CM | POA: Diagnosis not present

## 2017-07-12 DIAGNOSIS — F209 Schizophrenia, unspecified: Secondary | ICD-10-CM

## 2017-07-12 DIAGNOSIS — T426X1A Poisoning by other antiepileptic and sedative-hypnotic drugs, accidental (unintentional), initial encounter: Secondary | ICD-10-CM | POA: Diagnosis present

## 2017-07-12 DIAGNOSIS — T426X4A Poisoning by other antiepileptic and sedative-hypnotic drugs, undetermined, initial encounter: Secondary | ICD-10-CM | POA: Diagnosis not present

## 2017-07-12 DIAGNOSIS — R946 Abnormal results of thyroid function studies: Secondary | ICD-10-CM | POA: Diagnosis not present

## 2017-07-12 DIAGNOSIS — T1491XA Suicide attempt, initial encounter: Secondary | ICD-10-CM | POA: Diagnosis not present

## 2017-07-12 DIAGNOSIS — Z8249 Family history of ischemic heart disease and other diseases of the circulatory system: Secondary | ICD-10-CM | POA: Diagnosis not present

## 2017-07-12 DIAGNOSIS — I10 Essential (primary) hypertension: Secondary | ICD-10-CM | POA: Diagnosis not present

## 2017-07-12 DIAGNOSIS — R569 Unspecified convulsions: Secondary | ICD-10-CM | POA: Diagnosis not present

## 2017-07-12 DIAGNOSIS — T426X2A Poisoning by other antiepileptic and sedative-hypnotic drugs, intentional self-harm, initial encounter: Secondary | ICD-10-CM | POA: Diagnosis not present

## 2017-07-12 DIAGNOSIS — Z811 Family history of alcohol abuse and dependence: Secondary | ICD-10-CM | POA: Diagnosis not present

## 2017-07-12 DIAGNOSIS — M1990 Unspecified osteoarthritis, unspecified site: Secondary | ICD-10-CM | POA: Diagnosis not present

## 2017-07-12 DIAGNOSIS — G9340 Encephalopathy, unspecified: Secondary | ICD-10-CM | POA: Diagnosis not present

## 2017-07-12 DIAGNOSIS — F319 Bipolar disorder, unspecified: Secondary | ICD-10-CM | POA: Diagnosis not present

## 2017-07-12 DIAGNOSIS — E7220 Disorder of urea cycle metabolism, unspecified: Secondary | ICD-10-CM | POA: Insufficient documentation

## 2017-07-12 DIAGNOSIS — T4392XA Poisoning by unspecified psychotropic drug, intentional self-harm, initial encounter: Secondary | ICD-10-CM | POA: Diagnosis not present

## 2017-07-12 DIAGNOSIS — W1830XA Fall on same level, unspecified, initial encounter: Secondary | ICD-10-CM | POA: Diagnosis present

## 2017-07-12 DIAGNOSIS — E0590 Thyrotoxicosis, unspecified without thyrotoxic crisis or storm: Secondary | ICD-10-CM | POA: Diagnosis present

## 2017-07-12 DIAGNOSIS — R000 Tachycardia, unspecified: Secondary | ICD-10-CM | POA: Diagnosis not present

## 2017-07-12 DIAGNOSIS — T4391XA Poisoning by unspecified psychotropic drug, accidental (unintentional), initial encounter: Secondary | ICD-10-CM | POA: Diagnosis present

## 2017-07-12 DIAGNOSIS — F314 Bipolar disorder, current episode depressed, severe, without psychotic features: Secondary | ICD-10-CM | POA: Diagnosis not present

## 2017-07-12 DIAGNOSIS — R262 Difficulty in walking, not elsewhere classified: Secondary | ICD-10-CM | POA: Diagnosis present

## 2017-07-12 DIAGNOSIS — T50901A Poisoning by unspecified drugs, medicaments and biological substances, accidental (unintentional), initial encounter: Secondary | ICD-10-CM | POA: Diagnosis not present

## 2017-07-12 DIAGNOSIS — E876 Hypokalemia: Secondary | ICD-10-CM | POA: Diagnosis not present

## 2017-07-12 DIAGNOSIS — Z818 Family history of other mental and behavioral disorders: Secondary | ICD-10-CM | POA: Diagnosis not present

## 2017-07-12 DIAGNOSIS — R4182 Altered mental status, unspecified: Secondary | ICD-10-CM | POA: Diagnosis not present

## 2017-07-12 LAB — COMPREHENSIVE METABOLIC PANEL
ALT: 19 U/L (ref 14–54)
ALT: 22 U/L (ref 14–54)
AST: 18 U/L (ref 15–41)
AST: 24 U/L (ref 15–41)
Albumin: 4.2 g/dL (ref 3.5–5.0)
Albumin: 4.7 g/dL (ref 3.5–5.0)
Alkaline Phosphatase: 39 U/L (ref 38–126)
Alkaline Phosphatase: 45 U/L (ref 38–126)
Anion gap: 12 (ref 5–15)
Anion gap: 9 (ref 5–15)
BUN: 14 mg/dL (ref 6–20)
BUN: 17 mg/dL (ref 6–20)
CO2: 25 mmol/L (ref 22–32)
CO2: 25 mmol/L (ref 22–32)
Calcium: 10.7 mg/dL — ABNORMAL HIGH (ref 8.9–10.3)
Calcium: 9.9 mg/dL (ref 8.9–10.3)
Chloride: 111 mmol/L (ref 101–111)
Chloride: 96 mmol/L — ABNORMAL LOW (ref 101–111)
Creatinine, Ser: 0.93 mg/dL (ref 0.44–1.00)
Creatinine, Ser: 1.13 mg/dL — ABNORMAL HIGH (ref 0.44–1.00)
GFR calc Af Amer: >60 mL/min (ref >60)
GFR calc Af Amer: >60 mL/min (ref >60)
GFR calc non Af Amer: 54 mL/min — ABNORMAL LOW (ref >60)
GFR calc non Af Amer: >60 mL/min (ref >60)
Glucose, Bld: 124 mg/dL — ABNORMAL HIGH (ref 65–99)
Glucose, Bld: 94 mg/dL (ref 65–99)
Potassium: 3.2 mmol/L — ABNORMAL LOW (ref 3.5–5.1)
Potassium: 4.4 mmol/L (ref 3.5–5.1)
Sodium: 133 mmol/L — ABNORMAL LOW (ref 135–145)
Sodium: 145 mmol/L (ref 135–145)
Total Bilirubin: 0.3 mg/dL (ref 0.3–1.2)
Total Bilirubin: 0.9 mg/dL (ref 0.3–1.2)
Total Protein: 7.2 g/dL (ref 6.5–8.1)
Total Protein: 7.9 g/dL (ref 6.5–8.1)

## 2017-07-12 LAB — GLUCOSE, CAPILLARY
Glucose-Capillary: 83 mg/dL (ref 65–99)
Glucose-Capillary: 64 mg/dL — ABNORMAL LOW (ref 65–99)
Glucose-Capillary: 144 mg/dL — ABNORMAL HIGH (ref 65–99)
Glucose-Capillary: 81 mg/dL (ref 65–99)

## 2017-07-12 LAB — TROPONIN I: Troponin I: <0.03 ng/mL (ref <0.03)

## 2017-07-12 LAB — MAGNESIUM: Magnesium: 1.7 mg/dL (ref 1.7–2.4)

## 2017-07-12 LAB — CK: Total CK: 85 U/L (ref 38–234)

## 2017-07-12 LAB — VALPROIC ACID LEVEL
Valproic Acid Lvl: 152 ug/mL — ABNORMAL HIGH (ref 50.0–100.0)
Valproic Acid Lvl: 158 ug/mL — ABNORMAL HIGH (ref 50.0–100.0)
Valproic Acid Lvl: 171 ug/mL — ABNORMAL HIGH (ref 50.0–100.0)

## 2017-07-12 LAB — CBC
HCT: 38.7 % (ref 36.0–46.0)
Hemoglobin: 13.9 g/dL (ref 12.0–15.0)
MCH: 28.4 pg (ref 26.0–34.0)
MCHC: 35.9 g/dL (ref 30.0–36.0)
MCV: 79 fL (ref 78.0–100.0)
Platelets: 179 10*3/uL (ref 150–400)
RBC: 4.9 MIL/uL (ref 3.87–5.11)
RDW: 15.1 % (ref 11.5–15.5)
WBC: 7.9 10*3/uL (ref 4.0–10.5)

## 2017-07-12 LAB — BLOOD GAS, VENOUS
Acid-Base Excess: 2.2 mmol/L — ABNORMAL HIGH (ref 0.0–2.0)
Bicarbonate: 26.4 mmol/L (ref 20.0–28.0)
FIO2: 21
O2 Saturation: 75.7 %
Patient temperature: 98.6
pCO2, Ven: 41.5 mmHg — ABNORMAL LOW (ref 44.0–60.0)
pH, Ven: 7.419 (ref 7.250–7.430)
pO2, Ven: 43.8 mmHg (ref 32.0–45.0)

## 2017-07-12 LAB — RAPID URINE DRUG SCREEN, HOSP PERFORMED
Amphetamines: NOT DETECTED
Barbiturates: NOT DETECTED
Benzodiazepines: NOT DETECTED
Cocaine: NOT DETECTED
Opiates: NOT DETECTED
Tetrahydrocannabinol: NOT DETECTED

## 2017-07-12 LAB — PROTIME-INR
INR: 0.92
Prothrombin Time: 12.4 seconds (ref 11.4–15.2)

## 2017-07-12 LAB — LITHIUM LEVEL
Lithium Lvl: <0.06 mmol/L — ABNORMAL LOW (ref 0.60–1.20)
Lithium Lvl: <0.06 mmol/L — ABNORMAL LOW (ref 0.60–1.20)

## 2017-07-12 LAB — ETHANOL: Alcohol, Ethyl (B): <5 mg/dL (ref <5)

## 2017-07-12 LAB — AMMONIA: Ammonia: 37 umol/L — ABNORMAL HIGH (ref 9–35)

## 2017-07-12 LAB — SALICYLATE LEVEL: Salicylate Lvl: <7 mg/dL (ref 2.8–30.0)

## 2017-07-12 LAB — ACETAMINOPHEN LEVEL: Acetaminophen (Tylenol), Serum: <10 ug/mL — ABNORMAL LOW (ref 10–30)

## 2017-07-12 LAB — TSH: TSH: 0.189 u[IU]/mL — ABNORMAL LOW (ref 0.350–4.500)

## 2017-07-12 LAB — PHOSPHORUS: Phosphorus: 3.7 mg/dL (ref 2.5–4.6)

## 2017-07-12 MED ORDER — LORAZEPAM 2 MG/ML IJ SOLN
1.0000 mg | INTRAMUSCULAR | Status: DC | PRN
  Administered 2017-07-12: 1 mg via INTRAVENOUS
  Filled 2017-07-12: qty 1

## 2017-07-12 MED ORDER — ONDANSETRON HCL 4 MG/2ML IJ SOLN
4.0000 mg | Freq: Four times a day (QID) | INTRAMUSCULAR | Status: DC | PRN
  Administered 2017-07-12: 4 mg via INTRAVENOUS
  Filled 2017-07-12: qty 2

## 2017-07-12 MED ORDER — LORAZEPAM 2 MG/ML IJ SOLN
1.0000 mg | Freq: Once | INTRAMUSCULAR | Status: AC
  Administered 2017-07-12: 1 mg via INTRAVENOUS
  Filled 2017-07-12: qty 1

## 2017-07-12 MED ORDER — DEXTROSE 50 % IV SOLN
INTRAVENOUS | Status: AC
  Administered 2017-07-12: 18:00:00
  Filled 2017-07-12: qty 50

## 2017-07-12 MED ORDER — HEPARIN SODIUM (PORCINE) 5000 UNIT/ML IJ SOLN
5000.0000 [IU] | Freq: Three times a day (TID) | INTRAMUSCULAR | Status: DC
  Administered 2017-07-12 – 2017-07-16 (×12): 5000 [IU] via SUBCUTANEOUS
  Filled 2017-07-12 (×12): qty 1

## 2017-07-12 MED ORDER — DEXTROSE-NACL 5-0.45 % IV SOLN
INTRAVENOUS | Status: DC
  Administered 2017-07-12 – 2017-07-13 (×2): via INTRAVENOUS

## 2017-07-12 MED ORDER — MAGNESIUM SULFATE 2 GM/50ML IV SOLN
2.0000 g | Freq: Once | INTRAVENOUS | Status: AC
  Administered 2017-07-12: 2 g via INTRAVENOUS
  Filled 2017-07-12: qty 50

## 2017-07-12 MED ORDER — SODIUM CHLORIDE 0.9 % IV SOLN: INTRAVENOUS | Status: DC

## 2017-07-12 MED ORDER — POTASSIUM CHLORIDE IN NACL 40-0.9 MEQ/L-% IV SOLN
INTRAVENOUS | Status: DC
  Administered 2017-07-12: 125 mL/h via INTRAVENOUS
  Filled 2017-07-12: qty 1000

## 2017-07-12 MED ORDER — CHLORHEXIDINE GLUCONATE 0.12 % MT SOLN
15.0000 mL | Freq: Two times a day (BID) | OROMUCOSAL | Status: DC
  Administered 2017-07-12 – 2017-07-17 (×9): 15 mL via OROMUCOSAL
  Filled 2017-07-12 (×6): qty 15

## 2017-07-12 MED ORDER — ONDANSETRON HCL 4 MG PO TABS: 4.0000 mg | ORAL_TABLET | Freq: Four times a day (QID) | ORAL | Status: DC | PRN

## 2017-07-12 MED ORDER — POTASSIUM CHLORIDE 10 MEQ/100ML IV SOLN
10.0000 meq | INTRAVENOUS | Status: DC
  Administered 2017-07-12: 10 meq via INTRAVENOUS
  Filled 2017-07-12: qty 100

## 2017-07-12 MED ORDER — POTASSIUM CHLORIDE IN NACL 40-0.9 MEQ/L-% IV SOLN
INTRAVENOUS | Status: DC
  Filled 2017-07-12: qty 1000

## 2017-07-12 MED ORDER — ORAL CARE MOUTH RINSE
15.0000 mL | Freq: Two times a day (BID) | OROMUCOSAL | Status: DC
  Administered 2017-07-12 – 2017-07-14 (×5): 15 mL via OROMUCOSAL

## 2017-07-12 NOTE — Care Management Note (Signed)
Case Management Note  Patient Details  Name: Paula Dickson MRN: [id number] Date of Birth: March 22, 1966  Subjective/Objective:   overdose                 Action/Plan: Date:  July 12, 2017 Chart reviewed for concurrent status and case management needs. Will continue to follow patient progress. Discharge Planning: following for needs Expected discharge date: [id number] Velva Harman, BSN, [place], [place]  Expected Discharge Date:                  Expected Discharge Plan:  Home/Self Care  In-House Referral:  Clinical Social Work  Discharge planning Services  CM Consult  Post Acute Care Choice:    Choice offered to:     DME Arranged:    DME Agency:     HH Arranged:    [place] Agency:     Status of Service:  In process, will continue to follow  If discussed at Long Length of Stay Meetings, dates discussed:    Additional Comments:  Leeroy Cha, RN 07/12/2017, 8:48 AM

## 2017-07-12 NOTE — H&P (Signed)
History and Physical    Paula Dickson LKG:[id number] DOB: 1966-02-26 DOA: 07/11/2017  PCP: Patient, No Pcp Per   Patient coming from: Home.  I have personally briefly reviewed patient's old medical records in [place]  Chief Complaint: Overdose.  HPI: Paula Dickson is a 51 y.o. female with medical history significant of bipolar affective disorder, depression, schizophrenia, osteoarthritis, varicella-zoster, genital warts, heart murmur, hypertension, hyperthyroidism who is brought to the emergency department by her siblings due to possible overdose. Apparently, the patient was supposed to follow-up with psychiatry today and felt very nervous. Her sister reported that the patient went to talk to her father, hardly able to walk and told him that she has taken a lot of her medications tablets. She subsequently had a fall at home, but did not lose consciousness. She seems currently confused and is not answering questions. Unable to obtain further history or review of systems.  ED Course: Initial vital signs temperature 37.3C, pulse 127, respirations 34, blood pressure 145/115 mmHg and O2 sat 96% on room air. She was given IV fluids and lorazepam in the emergency department. CBC, PT/INR and urine toxicology were normal. EKG was sinus rhythm with an age undetermined anteroseptal infarct. Troponin level was normal. Sodium 138, potassium 3.0, chloride 96, bicarbonate 25 and lithium less than 0.06 mmol/L. BUN was 17, creatinine 1.13, calcium 10.7, magnesium 1.7, phosphorus 3.7 and glucose 124 mg/dL. Her ammonia level was mildly increased at 37. Salicylate was less than 7.0 mg/dL, acetaminophen less than 10  and valproic acid 158 mcg/mL.   Imaging: CT head was unremarkable. Please see images and full radiology report for further detail.  Review of Systems: As per HPI otherwise 10 point review of systems negative.    Past Medical History:  Diagnosis Date  . Bipolar affective disorder ([place])     . History of arthritis   . History of chicken pox   . History of depression   . History of genital warts   . history of heart murmur   . History of high blood pressure   . History of thyroid disease   . History of UTI   . Hypertension   . Schizophrenia [facility])     Past Surgical History:  Procedure Laterality Date  . ABLATION ON ENDOMETRIOSIS    . CYST REMOVAL NECK     around 11 years ago /benign  . MULTIPLE TOOTH EXTRACTIONS       reports that she has never smoked. She has never used smokeless tobacco. She reports that she does not drink alcohol or use drugs.  No Known Allergies  Family History  Problem Relation Age of Onset  . Arthritis Father   . Hyperlipidemia Father   . High blood pressure Father   . Diabetes Sister   . Diabetes Mother   . Diabetes Brother   . Mental illness Brother   . Alcohol abuse Paternal Uncle   . Alcohol abuse Paternal Grandfather   . Breast cancer Maternal Aunt   . Breast cancer Paternal Aunt   . High blood pressure Sister   . Mental illness Other        runs in family    Prior to Admission medications   Medication Sig Start Date End Date Taking? Authorizing Provider  amLODipine (NORVASC) 5 MG tablet TAKE 1 TABLET BY MOUTH EVERY DAY Patient taking differently: TAKE 5 MG BY MOUTH EVERY DAY 02/14/16  Yes Lucille Passy, MD  divalproex (DEPAKOTE SPRINKLE) 125 MG capsule Take  4 capsules by mouth 2 (two) times daily. 06/14/17  Yes [provider]  OLANZapine (ZYPREXA) 20 MG tablet Take 20 mg by mouth daily. 06/13/17  Yes [provider]  benztropine (COGENTIN) 0.5 MG tablet Take 1 tablet (0.5 mg total) by mouth at bedtime. Patient not taking: Reported on 07/11/2017 03/30/17   Patrecia Pour, NP  lithium carbonate (ESKALITH) 450 MG CR tablet Take 1 tablet (450 mg total) by mouth every 12 (twelve) hours. Patient not taking: Reported on 07/11/2017 03/30/17   Patrecia Pour, NP  propranolol (INDERAL) 20 MG tablet Take 1 tablet (20 mg  total) by mouth 3 (three) times daily. Patient taking differently: Take 10 mg by mouth 3 (three) times daily.  12/20/15   Pucilowska, Herma Ard B, MD  QUEtiapine (SEROQUEL XR) 300 MG 24 hr tablet Take 2 tablets (600 mg total) by mouth at bedtime. Patient not taking: Reported on 07/11/2017 03/30/17   Patrecia Pour, NP    Physical Exam: Vitals:   07/12/17 0530 07/12/17 0545 07/12/17 0600 07/12/17 0713  BP: (!) 128/19 131/89 121/87 118/89  Pulse:    95  Resp: (!) 26 (!) 24 (!) 21 (!) 24  Temp:    99.5 F (37.5 C)  TempSrc:    Axillary  SpO2:    98%    Constitutional: NAD, calm, comfortable Eyes: PERRL, lids and conjunctivae normal ENMT: Mucous membranes are dry. Posterior pharynx clear of any exudate or lesions. Neck: normal, supple, no masses, no thyromegaly Respiratory: clear to auscultation bilaterally, no wheezing, no crackles. Normal respiratory effort. No accessory muscle use.  Cardiovascular: Tachycardic at 118 bpm, no murmurs / rubs / gallops. No extremity edema. 2+ pedal pulses. No carotid bruits.  Abdomen: Soft, no tenderness, no masses palpated. No hepatosplenomegaly. Bowel sounds positive.  Musculoskeletal: no clubbing / cyanosis. Good ROM, no contractures. Increased muscle tone.  Skin: no rashes, lesions, ulcers on limited skin exam. Neurologic: Restless. Positive tremors. Alert, but nonverbal. Does not follow commands. Moves all extremities. Psychiatric: Restless. Nonverbal.   Labs on Admission: I have personally reviewed following labs and imaging studies  CBC:  Recent Labs Lab 07/11/17 2346  WBC 7.9  HGB 13.9  HCT 38.7  MCV 79.0  PLT 315   Basic Metabolic Panel:  Recent Labs Lab 07/11/17 2346 07/11/17 2347  NA 133*  --   K 3.2*  --   CL 96*  --   CO2 25  --   GLUCOSE 124*  --   BUN 17  --   CREATININE 1.13*  --   CALCIUM 10.7*  --   MG  --  1.7  PHOS  --  3.7   GFR: CrCl cannot be calculated (Unknown ideal weight.). Liver Function  Tests:  Recent Labs Lab 07/11/17 2346  AST 24  ALT 22  ALKPHOS 45  BILITOT 0.9  PROT 7.9  ALBUMIN 4.7   No results for input(s): LIPASE, AMYLASE in the last 168 hours.  Recent Labs Lab 07/12/17 0145  AMMONIA 37*   Coagulation Profile:  Recent Labs Lab 07/11/17 2347  INR 0.92   Cardiac Enzymes:  Recent Labs Lab 07/11/17 2347  TROPONINI <0.03   BNP (last 3 results) No results for input(s): PROBNP in the last 8760 hours. HbA1C: No results for input(s): HGBA1C in the last 72 hours. CBG:  Recent Labs Lab 07/11/17 2337  GLUCAP 123*   Lipid Profile: No results for input(s): CHOL, HDL, LDLCALC, TRIG, CHOLHDL, LDLDIRECT in the last 72 hours.  Thyroid Function Tests: No results for input(s): TSH, T4TOTAL, FREET4, T3FREE, THYROIDAB in the last 72 hours. Anemia Panel: No results for input(s): VITAMINB12, FOLATE, FERRITIN, TIBC, IRON, RETICCTPCT in the last 72 hours. Urine analysis:    Component Value Date/Time   COLORURINE YELLOW (A) 12/18/2015 1240   APPEARANCEUR HAZY (A) 12/18/2015 1240   LABSPEC 1.004 (L) 12/18/2015 1240   PHURINE 8.0 12/18/2015 1240   GLUCOSEU NEGATIVE 12/18/2015 1240   HGBUR NEGATIVE 12/18/2015 1240   BILIRUBINUR neg 12/27/2015 1421   KETONESUR NEGATIVE 12/18/2015 1240   PROTEINUR neg 12/27/2015 1421   PROTEINUR NEGATIVE 12/18/2015 1240   UROBILINOGEN 0.2 12/27/2015 1421   UROBILINOGEN 0.2 06/14/2015 0040   NITRITE neg 12/27/2015 1421   NITRITE NEGATIVE 12/18/2015 1240   LEUKOCYTESUR large (3+) (A) 12/27/2015 1421    Radiological Exams on Admission: Ct Head Wo Contrast  Result Date: 07/12/2017 CLINICAL DATA:  Acute onset of altered level of consciousness. Difficulty with ambulation and speech. Initial encounter. EXAM: CT HEAD WITHOUT CONTRAST TECHNIQUE: Contiguous axial images were obtained from the base of the skull through the vertex without intravenous contrast. COMPARISON:  None. FINDINGS: Brain: No evidence of acute infarction,  hemorrhage, hydrocephalus, extra-axial collection or mass lesion/mass effect. The posterior fossa, including the cerebellum, brainstem and fourth ventricle, is within normal limits. The third and lateral ventricles, and basal ganglia are unremarkable in appearance. The cerebral hemispheres are symmetric in appearance, with normal gray-white differentiation. No mass effect or midline shift is seen. Vascular: No hyperdense vessel or unexpected calcification. Skull: There is no evidence of fracture; visualized osseous structures are unremarkable in appearance. Sinuses/Orbits: The orbits are within normal limits. The paranasal sinuses and mastoid air cells are well-aerated. Other: No significant soft tissue abnormalities are seen. IMPRESSION: Unremarkable noncontrast CT of the head. Electronically Signed   By: Garald Balding M.D.   On: 07/12/2017 02:44    EKG: Independently reviewed. Vent. rate 132 BPM PR interval * ms QRS duration 79 ms QT/QTc 322/478 ms P-R-T axes 75 33 * Sinus tachycardia Anteroseptal infarct, age indeterminate  Assessment/Plan Principal Problem:   Overdose of psychotropic Unknown agents at this time, except for Depakote. Her sister will bring her medications later today. Observation/telemetry. Bedside sitter. Continue IV fluids. Supplemental oxygen as needed. Supplement electrolytes. Lorazepam 1 mg IVP every 4 hours as needed.  Active Problems:   Valproic acid toxicity Likely the cause of tremors. Continue IV fluids. Lorazepam for tremors or seizures. Follow-up valproic acid level ammonia level electrolytes and LFTs.    Hyperammonemia (HCC) In the setting of valproic acid toxicity. Continue IV fluids. Follow-up valproic acid and ammonia level.    Hypokalemia Replacing. Supplement magnesium. Follow-up potassium level.    Hyponatremia The patient has a history of polydipsia. Continue normal saline infusion. Follow-up sodium level.     Hypercalcemia Continue IV fluids. Follow-up calcium level.    Hyperthyroidism TSH on 04/04/2017 was 0.016 Will recheck TSH today.    DVT prophylaxis: Heparin SQ. Code Status: Full code. Family Communication:  Disposition Plan: Admit for close observation, telemetry monitoring, IV fluids and electrolyte replacement. Consults called:  Admission status: Inpatient/telemetry     Reubin Milan MD [facility] Pager [phone].  If 7PM-7AM, please contact night-coverage [email or web address] Password [email or web address]  07/12/2017, 7:50 AM

## 2017-07-12 NOTE — Progress Notes (Signed)
  PROGRESS NOTE  Hena Ewalt FVC:[id number] DOB: [birth year] DOA: 07/11/2017 PCP: Patient, No Pcp Per  Brief Narrative: 51 year old woman PMH bipolar disorder, schizophrenia presented with possible overdose, family reported the patient had difficulty walking and had taken a lot of her medications subsequently had a fall at home without loss of consciousness. On admission the patient was confused and unable to provide history. Admitted for overdose.  Assessment/Plan Overdose with associated acute encephalopathy secondary to medications unknown. Chronic medications include amlodipine, Depakote, olanzapine, benztropine, lithium, propranolol, quetiapine. -Hypertensive, modestly tachycardic, afebrile, no hypoxia. Treated with Ativan recently, explaining somnolence. -Supportive care, repeat Lithium and valproic acid levels today and in AM -Repeat EKG to assess QTc. -Continue bedside sitter. Psychiatry consultation once stable.  Valproic acid elevation, possibly toxicity -repeat level this afternoon and in the morning.  Elevated TSH 04/04/2017 -Repeat TSH.  Bipolar affective disorder, schizophrenia -Psychiatry consult when stable.  DVT prophylaxis: heparin Code Status: full Family Communication: none Disposition Plan: pending    Murray Hodgkins, MD  [facility] Direct contact: [phone] --[place]ia [place]  --[email or web address]; password [email or web address]  7PM-7AM contact night coverage as above 07/12/2017, 9:35 AM  LOS: 0 days   Consultants:    Procedures:    Antimicrobials:    Interval history/Subjective: Sleeping.  Objective: Vitals: Afebrile, 99.0, 20, 102, 150/72, 100% on room air.  Exam:     Constitutional: Appears calm, comfortable. Nontoxic.  Eyes. Pupils, irises, lids appear unremarkable.  ENT. Lips appear grossly normal.  Respiratory. Clear to auscultation bilaterally. No wheezes, rales or rhonchi. Normal respiratory effort.  Cardiovascular. Regular rate and  rhythm. No murmur, rub or gallop. No lower extremity edema.  Psychiatric. Cannot assess.  Musculoskeletal. Cannot assess other than tone which appears grossly normal extremities.  Skin. Appears grossly unremarkable without rash or induration. Nontender to palpation.   I have personally reviewed the following:   Labs:  Venous blood gas was unremarkable.  Potassium 3.2 on admission, calcium modestly elevated 10.7, anion gap within normal limits. Phosphorus and magnesium within normal limits. LFTs unremarkable.  Serum ammonia modestly elevated, 37  Troponin negative  CBC unremarkable  Lithium level is low, Tylenol, ethanol and salicylate levels were negative. Valproic acid level was elevated at 158. Urine drug screen was negative.  Imaging studies:  CT head no acute disease  Medical tests:  Admission EKG reviewed independently, sinus tachycardia, normal QTC. No acute changes.   Scheduled Meds: . heparin  5,000 Units Subcutaneous Q8H   Continuous Infusions: . 0.9 % NaCl with KCl 40 mEq / L 125 mL/hr (07/12/17 0834)  . magnesium sulfate 1 - 4 g bolus IVPB      Principal Problem:   Overdose of psychotropic Active Problems:   Hypokalemia   Hyponatremia   Hyperthyroidism   Valproic acid toxicity   Hypercalcemia   Hyperammonemia (HCC)   Schizophrenia (HCC)   Bipolar affective disorder (HCC)   LOS: 0 days

## 2017-07-12 NOTE — ED Notes (Addendum)
Pts sister Kamira Mellette to be called for update on pt status [phone]

## 2017-07-12 NOTE — Progress Notes (Signed)
Hypoglycemic Event  CBG: 64  Treatment: D50 IV 25 mL  Symptoms: None  Follow-up CBG: Time:1808 CBG Result:144  Possible Reasons for Event: Inadequate meal intake  Comments/MD notified:Gave D50 iv 25ML patient was lethargic an unable to take in orally.     Paula Dickson

## 2017-07-12 NOTE — Progress Notes (Signed)
Poison Control called and recommended CPK level for pt at 1300 with valporic acid level.  Dr Sarajane Jews notified via text page.

## 2017-07-13 ENCOUNTER — Encounter (HOSPITAL_COMMUNITY): Payer: Self-pay | Admitting: Family Medicine

## 2017-07-13 DIAGNOSIS — T1491XA Suicide attempt, initial encounter: Secondary | ICD-10-CM

## 2017-07-13 DIAGNOSIS — R7989 Other specified abnormal findings of blood chemistry: Secondary | ICD-10-CM

## 2017-07-13 HISTORY — DX: Other specified abnormal findings of blood chemistry: R79.89

## 2017-07-13 LAB — CBC WITH DIFFERENTIAL/PLATELET
Basophils Absolute: 0 10*3/uL (ref 0.0–0.1)
Basophils Relative: 1 %
Eosinophils Absolute: 0 10*3/uL (ref 0.0–0.7)
Eosinophils Relative: 1 %
HCT: 37.6 % (ref 36.0–46.0)
Hemoglobin: 12.5 g/dL (ref 12.0–15.0)
Lymphocytes Relative: 60 %
Lymphs Abs: 2.4 10*3/uL (ref 0.7–4.0)
MCH: 27.7 pg (ref 26.0–34.0)
MCHC: 33.2 g/dL (ref 30.0–36.0)
MCV: 83.2 fL (ref 78.0–100.0)
Monocytes Absolute: 0.3 10*3/uL (ref 0.1–1.0)
Monocytes Relative: 7 %
Neutro Abs: 1.3 10*3/uL — ABNORMAL LOW (ref 1.7–7.7)
Neutrophils Relative %: 31 %
Platelets: 149 10*3/uL — ABNORMAL LOW (ref 150–400)
RBC: 4.52 MIL/uL (ref 3.87–5.11)
RDW: 16.9 % — ABNORMAL HIGH (ref 11.5–15.5)
WBC: 4 10*3/uL (ref 4.0–10.5)

## 2017-07-13 LAB — COMPREHENSIVE METABOLIC PANEL
ALT: 16 U/L (ref 14–54)
AST: 15 U/L (ref 15–41)
Albumin: 3.6 g/dL (ref 3.5–5.0)
Alkaline Phosphatase: 33 U/L — ABNORMAL LOW (ref 38–126)
Anion gap: 6 (ref 5–15)
BUN: 14 mg/dL (ref 6–20)
CO2: 27 mmol/L (ref 22–32)
Calcium: 9.5 mg/dL (ref 8.9–10.3)
Chloride: 114 mmol/L — ABNORMAL HIGH (ref 101–111)
Creatinine, Ser: 0.96 mg/dL (ref 0.44–1.00)
GFR calc Af Amer: >60 mL/min (ref >60)
GFR calc non Af Amer: >60 mL/min (ref >60)
Glucose, Bld: 87 mg/dL (ref 65–99)
Potassium: 4.1 mmol/L (ref 3.5–5.1)
Sodium: 147 mmol/L — ABNORMAL HIGH (ref 135–145)
Total Bilirubin: 0.6 mg/dL (ref 0.3–1.2)
Total Protein: 6.2 g/dL — ABNORMAL LOW (ref 6.5–8.1)

## 2017-07-13 LAB — LITHIUM LEVEL: Lithium Lvl: <0.06 mmol/L — ABNORMAL LOW (ref 0.60–1.20)

## 2017-07-13 LAB — VALPROIC ACID LEVEL: Valproic Acid Lvl: 98 ug/mL (ref 50.0–100.0)

## 2017-07-13 LAB — HIV ANTIBODY (ROUTINE TESTING W REFLEX): HIV Screen 4th Generation wRfx: NONREACTIVE

## 2017-07-13 LAB — GLUCOSE, CAPILLARY
Glucose-Capillary: 82 mg/dL (ref 65–99)
Glucose-Capillary: 83 mg/dL (ref 65–99)
Glucose-Capillary: 93 mg/dL (ref 65–99)

## 2017-07-13 LAB — PHOSPHORUS: Phosphorus: 4 mg/dL (ref 2.5–4.6)

## 2017-07-13 LAB — MAGNESIUM: Magnesium: 2.3 mg/dL (ref 1.7–2.4)

## 2017-07-13 LAB — AMMONIA: Ammonia: 40 umol/L — ABNORMAL HIGH (ref 9–35)

## 2017-07-13 NOTE — Progress Notes (Signed)
  PROGRESS NOTE  Paula Dickson HTD:[id number] DOB: 1966/06/20 DOA: 07/11/2017 PCP: Patient, No Pcp Per  Brief Narrative: 51 year old woman PMH bipolar disorder, schizophrenia presented with possible overdose, family reported the patient had difficulty walking and had taken a lot of her medications subsequently had a fall at home without loss of consciousness. On admission the patient was confused and unable to provide history. Admitted for overdose.  Assessment/Plan Suicide attempt with medication overdose with associated acute encephalopathy. Chronic medications include amlodipine, Depakote, olanzapine, benztropine, lithium, propranolol, quetiapine. -Hemodynamics stable, blood work reassuring, no evidence of sequela. -Awake and alert. Encephalopathy resolved. Endorses suicide attempt. -Suicide precautions, sitter at bedside. Psychiatry consultation.  Valproic acid toxicity -resolved. Level WNL now. -Discussed with poison control, no further recommendations. Case closed.  Elevated TSH 04/04/2017 -TSH 0.189, low also 03/2017. Check free T4, T3. Asymptomatic. Will need outpatient f/u.  Bipolar affective disorder, schizophrenia -psychiatry consult today   Patient much improved. Psychiatry consultation today. Patient is medically clear.  DVT prophylaxis: heparin Code Status: full Family Communication: none Disposition Plan: pending    Paula Hodgkins, MD  [facility] Direct contact: [phone] --Via [place]  --[email or web address]; password [email or web address]  7PM-7AM contact night coverage as above 07/13/2017, 10:17 AM  LOS: 1 day   Consultants:    Procedures:    Antimicrobials:    Interval history/Subjective: Discussed with poison control yesterday, recommended supportive care. Discussed with nursing, no issues overnight.  Patient awake and alert this morning. She reports hunger. She does not recall exactly what happened but does remember taking extra medication. She reports  this was a suicide attempt.  Objective: Vitals: Afebrile, 98.3, 77, 16, 117/65, 99% on room air  Exam:     Constitutional. Appears comfortable. Somewhat anxious. Nontoxic.  Eyes. Pupils irises, lids appear unremarkable.  Cardiovascular. Regular rate and rhythm. 2/6 systolic murmur. No rub or gallop. No lower extremity edema.  Respiratory. Clear to auscultation bilaterally. No wheezes, rales or rhonchi. Normal respiratory effort.  Abdomen soft  Musculoskeletal. Grossly normal tone and strength all extremities.  Psychiatric. Tearful, depressed, anxious. Oriented to self, hospital, month, not year (2019).  I have personally reviewed the following:   Labs:  Complete metabolic panel unremarkable  CBC unremarkable  Lithium level negative  Valproic acid level trending down, 98  CK WNL  Imaging studies:    Medical tests:  EKG sinus rhythm, PVC, suspect limb lead reversal, T-wave inversion, consider inferolateral ischemia. Suspect artifact.  Scheduled Meds: . chlorhexidine  15 mL Mouth Rinse BID  . heparin  5,000 Units Subcutaneous Q8H  . mouth rinse  15 mL Mouth Rinse q12n4p   Continuous Infusions:   Principal Problem:   Suicide attempt [facility]) Active Problems:   Overdose of psychotropic   Valproic acid toxicity   Schizophrenia (HCC)   Bipolar affective disorder (HCC)   Low TSH level   LOS: 1 day

## 2017-07-14 DIAGNOSIS — E0590 Thyrotoxicosis, unspecified without thyrotoxic crisis or storm: Secondary | ICD-10-CM

## 2017-07-14 DIAGNOSIS — R946 Abnormal results of thyroid function studies: Secondary | ICD-10-CM

## 2017-07-14 DIAGNOSIS — E876 Hypokalemia: Secondary | ICD-10-CM

## 2017-07-14 DIAGNOSIS — G9340 Encephalopathy, unspecified: Secondary | ICD-10-CM

## 2017-07-14 DIAGNOSIS — T426X2A Poisoning by other antiepileptic and sedative-hypnotic drugs, intentional self-harm, initial encounter: Principal | ICD-10-CM

## 2017-07-14 DIAGNOSIS — G929 Unspecified toxic encephalopathy: Secondary | ICD-10-CM

## 2017-07-14 DIAGNOSIS — E7220 Disorder of urea cycle metabolism, unspecified: Secondary | ICD-10-CM

## 2017-07-14 DIAGNOSIS — E871 Hypo-osmolality and hyponatremia: Secondary | ICD-10-CM

## 2017-07-14 DIAGNOSIS — F319 Bipolar disorder, unspecified: Secondary | ICD-10-CM

## 2017-07-14 DIAGNOSIS — T4392XA Poisoning by unspecified psychotropic drug, intentional self-harm, initial encounter: Secondary | ICD-10-CM

## 2017-07-14 DIAGNOSIS — T1491XA Suicide attempt, initial encounter: Secondary | ICD-10-CM

## 2017-07-14 DIAGNOSIS — G92 Toxic encephalopathy: Secondary | ICD-10-CM

## 2017-07-14 DIAGNOSIS — Z818 Family history of other mental and behavioral disorders: Secondary | ICD-10-CM

## 2017-07-14 DIAGNOSIS — Z811 Family history of alcohol abuse and dependence: Secondary | ICD-10-CM

## 2017-07-14 LAB — URINALYSIS, ROUTINE W REFLEX MICROSCOPIC
Bilirubin Urine: NEGATIVE
Glucose, UA: NEGATIVE mg/dL
Hgb urine dipstick: NEGATIVE
Ketones, ur: NEGATIVE mg/dL
Leukocytes, UA: NEGATIVE
Nitrite: NEGATIVE
Protein, ur: NEGATIVE mg/dL
Specific Gravity, Urine: 1.009 (ref 1.005–1.030)
pH: 7 (ref 5.0–8.0)

## 2017-07-14 LAB — GLUCOSE, CAPILLARY
Glucose-Capillary: 109 mg/dL — ABNORMAL HIGH (ref 65–99)
Glucose-Capillary: 118 mg/dL — ABNORMAL HIGH (ref 65–99)
Glucose-Capillary: 89 mg/dL (ref 65–99)
Glucose-Capillary: 99 mg/dL (ref 65–99)
Glucose-Capillary: 99 mg/dL (ref 65–99)

## 2017-07-14 LAB — T4, FREE: Free T4: 1.1 ng/dL (ref 0.61–1.12)

## 2017-07-14 MED ORDER — OXYMETAZOLINE HCL 0.05 % NA SOLN
2.0000 | Freq: Two times a day (BID) | NASAL | Status: DC
  Filled 2017-07-14: qty 15

## 2017-07-14 NOTE — Discharge Summary (Addendum)
Physician Discharge Summary  Paula Dickson JJO:[id number] DOB: Oct 21, 1971 DOA: 07/11/2017  PCP: Patient, No Pcp Per  Admit date: 07/11/2017 Discharge date: 07/15/2017  Admitted From: home Disposition:  Home  Recommendations for Outpatient Follow-up:  1. Transfer to [facility] for suicidal ideations.  Home Health:No Equipment/Devices:none  Discharge Condition:stable CODE STATUS:full Diet recommendation: Heart Healthy   Brief/Interim Summary: 51 year old with history of bipolar disorder schizophrenia presents with intentional psychotropic medications overdose.  Discharge Diagnoses:  Principal Problem:   Suicide attempt [facility]) Active Problems:   Overdose of psychotropic   Valproic acid toxicity   Schizophrenia ([place])   Bipolar affective disorder (HCC)   Low TSH level   Toxic encephalopathy  Suicide attempt with intentional overdose of psychotropic medication valproic acid acid leading to toxic encephalopathy: She was treated symptomatically with IV fluids and nothing by mouth, she had no events on telemetry She remained hemodynamically stable, blood which showed no sequel. Psychiatry was consulted and recommended inpatient psych.  Valproic acid toxicity: Now resolved levels within normal. These was resumed orally.  Elevated TSH: Rodena Piety be repeated in 6 weeks as an outpatient.  Bipolar affective disorder/schizophrenia: Continue current regimen.  Discharge Instructions  Discharge Instructions    Diet - low sodium heart healthy    Complete by:  As directed    Increase activity slowly    Complete by:  As directed      Allergies as of 07/15/2017   No Known Allergies     Medication List    TAKE these medications   amLODipine 5 MG tablet Commonly known as:  NORVASC TAKE 1 TABLET BY MOUTH EVERY DAY What changed:  See the new instructions.   benztropine 0.5 MG tablet Commonly known as:  COGENTIN Take 1 tablet (0.5 mg total) by mouth at bedtime.   divalproex  125 MG capsule Commonly known as:  DEPAKOTE SPRINKLE Take 500 capsules by mouth 2 (two) times daily.   lithium carbonate 450 MG CR tablet Commonly known as:  ESKALITH Take 1 tablet (450 mg total) by mouth every 12 (twelve) hours.   OLANZapine 20 MG tablet Commonly known as:  ZYPREXA Take 20 mg by mouth daily.   propranolol 20 MG tablet Commonly known as:  INDERAL Take 1 tablet (20 mg total) by mouth 3 (three) times daily.   QUEtiapine 300 MG 24 hr tablet Commonly known as:  SEROQUEL XR Take 2 tablets (600 mg total) by mouth at bedtime.       No Known Allergies  Consultations:  Psyq   Procedures/Studies: Ct Head Wo Contrast  Result Date: 07/12/2017 CLINICAL DATA:  Acute onset of altered level of consciousness. Difficulty with ambulation and speech. Initial encounter. EXAM: CT HEAD WITHOUT CONTRAST TECHNIQUE: Contiguous axial images were obtained from the base of the skull through the vertex without intravenous contrast. COMPARISON:  None. FINDINGS: Brain: No evidence of acute infarction, hemorrhage, hydrocephalus, extra-axial collection or mass lesion/mass effect. The posterior fossa, including the cerebellum, brainstem and fourth ventricle, is within normal limits. The third and lateral ventricles, and basal ganglia are unremarkable in appearance. The cerebral hemispheres are symmetric in appearance, with normal gray-white differentiation. No mass effect or midline shift is seen. Vascular: No hyperdense vessel or unexpected calcification. Skull: There is no evidence of fracture; visualized osseous structures are unremarkable in appearance. Sinuses/Orbits: The orbits are within normal limits. The paranasal sinuses and mastoid air cells are well-aerated. Other: No significant soft tissue abnormalities are seen. IMPRESSION: Unremarkable noncontrast CT of the head. Electronically Signed  By: Garald Balding M.D.   On: 07/12/2017 02:44     Subjective: She relates no new complaints  tolerated her diet.  Discharge Exam: Vitals:   07/14/17 2116 07/15/17 0612  BP: (!) 152/81 (!) 142/86  Pulse: 67 70  Resp: 18 18  Temp: 98.2 F (36.8 C) 98.3 F (36.8 C)  SpO2: 100% 100%   Vitals:   07/14/17 0530 07/14/17 1300 07/14/17 2116 07/15/17 0612  BP: 136/71 116/68 (!) 152/81 (!) 142/86  Pulse: (!) 55 70 67 70  Resp: 17 18 18 18   Temp: 98 F (36.7 C) (!) 97.5 F (36.4 C) 98.2 F (36.8 C) 98.3 F (36.8 C)  TempSrc: Oral Oral Oral Oral  SpO2: 97% 99% 100% 100%    General: In no acute distress. Cardiovascular: regular rate and  rhythm with positive S1 and S2. Respiratory: She has good air movement and clear to auscultation. Abdominal: Soft, NT, ND, bowel sounds + Extremities: No lower extremity edema.    The results of significant diagnostics from this hospitalization (including imaging, microbiology, ancillary and laboratory) are listed below for reference.     Microbiology: No results found for this or any previous visit (from the past 240 hour(s)).   Labs: BNP (last 3 results) No results for input(s): BNP in the last 8760 hours. Basic Metabolic Panel:  Recent Labs Lab 07/11/17 2346 07/11/17 2347 07/12/17 1340 07/13/17 0632  NA 133*  --  145 147*  K 3.2*  --  4.4 4.1  CL 96*  --  111 114*  CO2 25  --  25 27  GLUCOSE 124*  --  94 87  BUN 17  --  14 14  CREATININE 1.13*  --  0.93 0.96  CALCIUM 10.7*  --  9.9 9.5  MG  --  1.7  --  2.3  PHOS  --  3.7  --  4.0   Liver Function Tests:  Recent Labs Lab 07/11/17 2346 07/12/17 1340 07/13/17 0632  AST 24 18 15   ALT 22 19 16   ALKPHOS 45 39 33*  BILITOT 0.9 0.3 0.6  PROT 7.9 7.2 6.2*  ALBUMIN 4.7 4.2 3.6   No results for input(s): LIPASE, AMYLASE in the last 168 hours.  Recent Labs Lab 07/12/17 0145 07/13/17 0632  AMMONIA 37* 40*   CBC:  Recent Labs Lab 07/11/17 2346 07/13/17 0632  WBC 7.9 4.0  NEUTROABS  --  1.3*  HGB 13.9 12.5  HCT 38.7 37.6  MCV 79.0 83.2  PLT 179 149*    Cardiac Enzymes:  Recent Labs Lab 07/11/17 2347 07/12/17 1340  CKTOTAL  --  85  TROPONINI <0.03  --    BNP: Invalid input(s): POCBNP CBG:  Recent Labs Lab 07/14/17 0609 07/14/17 1229 07/14/17 1741 07/14/17 2359 07/15/17 0559  GLUCAP 89 99 109* 118* 88   D-Dimer No results for input(s): DDIMER in the last 72 hours. Hgb A1c No results for input(s): HGBA1C in the last 72 hours. Lipid Profile No results for input(s): CHOL, HDL, LDLCALC, TRIG, CHOLHDL, LDLDIRECT in the last 72 hours. Thyroid function studies  Recent Labs  07/12/17 1340  TSH 0.189*   Anemia work up No results for input(s): VITAMINB12, FOLATE, FERRITIN, TIBC, IRON, RETICCTPCT in the last 72 hours. Urinalysis    Component Value Date/Time   COLORURINE YELLOW 07/14/2017 [place] 07/14/2017 0847   LABSPEC 1.009 07/14/2017 0847   PHURINE 7.0 07/14/2017 0847   GLUCOSEU NEGATIVE 07/14/2017 0847   HGBUR NEGATIVE  07/14/2017 [place] 07/14/2017 0847   BILIRUBINUR neg 12/27/2015 1421   [place] 07/14/2017 0847   PROTEINUR NEGATIVE 07/14/2017 0847   UROBILINOGEN 0.2 12/27/2015 1421   UROBILINOGEN 0.2 06/14/2015 0040   NITRITE NEGATIVE 07/14/2017 0847   LEUKOCYTESUR NEGATIVE 07/14/2017 0847   Sepsis Labs Invalid input(s): PROCALCITONIN,  WBC,  LACTICIDVEN Microbiology No results found for this or any previous visit (from the past 240 hour(s)).   Time coordinating discharge: Over 30 minutes  SIGNED:   Charlynne Cousins, MD  [facility] 07/15/2017, 7:56 AM Pager   If 7PM-7AM, please contact night-coverage [email or web address] Password [email or web address]

## 2017-07-14 NOTE — Consult Note (Signed)
[facility] Consult   Reason for Consult:  Intentional overdose Referring Physician:  Dr. Aileen Fass Patient Identification: Paula Dickson MRN:  [id number] Principal Diagnosis: Suicide attempt [facility]) Diagnosis:   Patient Active Problem List   Diagnosis Date Noted  . Toxic encephalopathy [G92] 07/14/2017  . Suicide attempt ([place]) [T14.91XA] 07/13/2017  . Low TSH level [R94.6] 07/13/2017  . Overdose of psychotropic [T43.91XA] 07/12/2017  . Valproic acid toxicity [T42.6X1A] 07/12/2017  . Hyperammonemia ([place]) [E72.20] 07/12/2017  . Schizophrenia ([place]) [F20.9] 07/12/2017  . Bipolar affective disorder ([place]) [F31.9] 07/12/2017  . Insomnia [G47.00] 12/27/2015  . Abnormal urinalysis [R82.90] 12/27/2015  . 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 Bipolar I disorder, most recent episode manic, severe with psychotic features ([place]) [F31.2] 06/14/2015    Total Time spent with patient: 1 hour  Subjective:   Paula Dickson is a 51 y.o. female patient admitted with Intentional overdose.  HPI:  Paula Dickson is a 51 y.o. female admitted to [facility] as a status post intentional overdose of psychotropic medication. Patient reportedly suffering with bipolar disorder and has been in and out of the acute psychiatric hospitalization for long time and recently admitted to [facility]. Patient reportedly follow-up with [facility] [place] [facility]. Patient denied current homicidal ideation, intention or plans. Patient denied auditory/visual hallucinations, delusions or paranoia. Patient has been depressed without any specific stresses and stated that she does not feel like she needed to leave any longer. Patient cannot contract for safety and meets criteria for acute psychiatric hospitalization.   ED Course: Initial vital signs temperature 37.3C, pulse 127, respirations 34, blood pressure 145/115 mmHg and O2 sat 96% on room  air. She was given IV fluids and lorazepam in the emergency department. CBC, PT/INR and urine toxicology were normal. EKG was sinus rhythm with an age undetermined anteroseptal infarct. Troponin level was normal. Sodium 138, potassium 3.0, chloride 96, bicarbonate 25 and lithium less than 0.06 mmol/L. BUN was 17, creatinine 1.13, calcium 10.7, magnesium 1.7, phosphorus 3.7 and glucose 124 mg/dL. Her ammonia level was mildly increased at 37. Salicylate was less than 7.0 mg/dL, acetaminophen less than 10  and valproic acid 158 mcg/mL.  Past Psychiatric History: bipolar affective disorder, depression, and schizophrenia, reportedly has long history of multiple acute inpatient psychiatric hospitalization at behavioral [facility] and a [facility].  Risk to Self: Is patient at risk for suicide?: No Risk to Others:   Prior Inpatient Therapy:   Prior Outpatient Therapy:    Past Medical History:  Past Medical History:  Diagnosis Date  . Bipolar affective disorder ([place])   . History of arthritis   . History of chicken pox   . History of depression   . History of genital warts   . history of heart murmur   . History of high blood pressure   . History of thyroid disease   . History of UTI   . Hypertension   . Low TSH level 07/13/2017  . Schizophrenia [facility])     Past Surgical History:  Procedure Laterality Date  . ABLATION ON ENDOMETRIOSIS    . CYST REMOVAL NECK     around 11 years ago /benign  . MULTIPLE TOOTH EXTRACTIONS     Family History:  Family History  Problem Relation Age of Onset  . Arthritis Father   . Hyperlipidemia Father   . High blood pressure Father   . Diabetes Sister   .  Diabetes Mother   . Diabetes Brother   . Mental illness Brother   . Alcohol abuse Paternal Uncle   . Alcohol abuse Paternal Grandfather   . Breast cancer Maternal Aunt   . Breast cancer Paternal Aunt   . High blood pressure Sister   . Mental illness Other         runs in family   Family Psychiatric  History: Patient reported her mom and deceased in 02-15-10 and she has been living with her dad and she has a brother who is in the long-term psychiatric facility for a normal mental illness. Social History:  History  Alcohol Use No     History  Drug Use No    Social History   Social History  . Marital status: Single    Spouse name: N/A  . Number of children: N/A  . Years of education: N/A   Social History Main Topics  . Smoking status: Never Smoker  . Smokeless tobacco: Never Used  . Alcohol use No  . Drug use: No  . Sexual activity: Not Currently   Other Topics Concern  . None   Social History Narrative  . None   Additional Social History:    Allergies:  No Known Allergies  Labs:  Results for orders placed or performed during the hospital encounter of 07/11/17 (from the past 48 hour(s))  TSH     Status: Abnormal   Collection Time: 07/12/17  1:40 PM  Result Value Ref Range   TSH 0.189 (L) 0.350 - 4.500 uIU/mL    Comment: Performed by a 3rd Generation assay with a functional sensitivity of <=0.01 uIU/mL.  Valproic acid level     Status: Abnormal   Collection Time: 07/12/17  1:40 PM  Result Value Ref Range   Valproic Acid Lvl 152 (H) 50.0 - 100.0 ug/mL    Comment: RESULTS CONFIRMED BY MANUAL DILUTION  Comprehensive metabolic panel     Status: None   Collection Time: 07/12/17  1:40 PM  Result Value Ref Range   Sodium 145 135 - 145 mmol/L    Comment: DELTA CHECK NOTED REPEATED TO VERIFY    Potassium 4.4 3.5 - 5.1 mmol/L    Comment: DELTA CHECK NOTED REPEATED TO VERIFY NO VISIBLE HEMOLYSIS    Chloride 111 101 - 111 mmol/L   CO2 25 22 - 32 mmol/L   Glucose, Bld 94 65 - 99 mg/dL   BUN 14 6 - 20 mg/dL   Creatinine, Ser 0.93 0.44 - 1.00 mg/dL   Calcium 9.9 8.9 - 10.3 mg/dL   Total Protein 7.2 6.5 - 8.1 g/dL   Albumin 4.2 3.5 - 5.0 g/dL   AST 18 15 - 41 U/L   ALT 19 14 - 54 U/L   Alkaline Phosphatase 39 38 - 126 U/L    Total Bilirubin 0.3 0.3 - 1.2 mg/dL   GFR calc non Af Amer >60 >60 mL/min   GFR calc Af Amer >60 >60 mL/min    Comment: (NOTE) The eGFR has been calculated using the CKD EPI equation. This calculation has not been validated in all clinical situations. eGFR's persistently <60 mL/min signify possible Chronic Kidney Disease.    Anion gap 9 5 - 15  Lithium level     Status: Abnormal   Collection Time: 07/12/17  1:40 PM  Result Value Ref Range   Lithium Lvl <0.06 (L) 0.60 - 1.20 mmol/L  CK     Status: None   Collection Time: 07/12/17  1:40 PM  Result Value Ref Range   Total CK 85 38 - 234 U/L  Glucose, capillary     Status: Abnormal   Collection Time: 07/12/17  5:25 PM  Result Value Ref Range   Glucose-Capillary 64 (L) 65 - 99 mg/dL  Glucose, capillary     Status: Abnormal   Collection Time: 07/12/17  6:08 PM  Result Value Ref Range   Glucose-Capillary 144 (H) 65 - 99 mg/dL  Glucose, capillary     Status: None   Collection Time: 07/12/17 11:12 PM  Result Value Ref Range   Glucose-Capillary 81 65 - 99 mg/dL  Glucose, capillary     Status: None   Collection Time: 07/13/17  5:48 AM  Result Value Ref Range   Glucose-Capillary 82 65 - 99 mg/dL  HIV antibody (Routine Testing)     Status: None   Collection Time: 07/13/17  6:32 AM  Result Value Ref Range   HIV Screen 4th Generation wRfx Non Reactive Non Reactive    Comment: (NOTE) Performed At: [facility] [place], [place] [id number] Lindon Romp MD FM:[id number]   CBC WITH DIFFERENTIAL     Status: Abnormal   Collection Time: 07/13/17  6:32 AM  Result Value Ref Range   WBC 4.0 4.0 - 10.5 K/uL   RBC 4.52 3.87 - 5.11 MIL/uL   Hemoglobin 12.5 12.0 - 15.0 g/dL   HCT 37.6 36.0 - 46.0 %   MCV 83.2 78.0 - 100.0 fL   MCH 27.7 26.0 - 34.0 pg   MCHC 33.2 30.0 - 36.0 g/dL   RDW 16.9 (H) 11.5 - 15.5 %   Platelets 149 (L) 150 - 400 K/uL   Neutrophils Relative % 31 %   Neutro Abs 1.3 (L) 1.7 - 7.7 K/uL    Lymphocytes Relative 60 %   Lymphs Abs 2.4 0.7 - 4.0 K/uL   Monocytes Relative 7 %   Monocytes Absolute 0.3 0.1 - 1.0 K/uL   Eosinophils Relative 1 %   Eosinophils Absolute 0.0 0.0 - 0.7 K/uL   Basophils Relative 1 %   Basophils Absolute 0.0 0.0 - 0.1 K/uL  Comprehensive metabolic panel     Status: Abnormal   Collection Time: 07/13/17  6:32 AM  Result Value Ref Range   Sodium 147 (H) 135 - 145 mmol/L   Potassium 4.1 3.5 - 5.1 mmol/L   Chloride 114 (H) 101 - 111 mmol/L   CO2 27 22 - 32 mmol/L   Glucose, Bld 87 65 - 99 mg/dL   BUN 14 6 - 20 mg/dL   Creatinine, Ser 0.96 0.44 - 1.00 mg/dL   Calcium 9.5 8.9 - 10.3 mg/dL   Total Protein 6.2 (L) 6.5 - 8.1 g/dL   Albumin 3.6 3.5 - 5.0 g/dL   AST 15 15 - 41 U/L   ALT 16 14 - 54 U/L   Alkaline Phosphatase 33 (L) 38 - 126 U/L   Total Bilirubin 0.6 0.3 - 1.2 mg/dL   GFR calc non Af Amer >60 >60 mL/min   GFR calc Af Amer >60 >60 mL/min    Comment: (NOTE) The eGFR has been calculated using the CKD EPI equation. This calculation has not been validated in all clinical situations. eGFR's persistently <60 mL/min signify possible Chronic Kidney Disease.    Anion gap 6 5 - 15  Magnesium     Status: None   Collection Time: 07/13/17  6:32 AM  Result Value Ref Range   Magnesium 2.3  1.7 - 2.4 mg/dL  Phosphorus     Status: None   Collection Time: 07/13/17  6:32 AM  Result Value Ref Range   Phosphorus 4.0 2.5 - 4.6 mg/dL  Ammonia     Status: Abnormal   Collection Time: 07/13/17  6:32 AM  Result Value Ref Range   Ammonia 40 (H) 9 - 35 umol/L  Valproic acid level     Status: None   Collection Time: 07/13/17  6:32 AM  Result Value Ref Range   Valproic Acid Lvl 98 50.0 - 100.0 ug/mL  Lithium level     Status: Abnormal   Collection Time: 07/13/17  6:32 AM  Result Value Ref Range   Lithium Lvl <0.06 (L) 0.60 - 1.20 mmol/L    Comment: REPEATED TO VERIFY  Glucose, capillary     Status: None   Collection Time: 07/13/17 11:45 AM  Result Value  Ref Range   Glucose-Capillary 83 65 - 99 mg/dL  Glucose, capillary     Status: None   Collection Time: 07/13/17  5:09 PM  Result Value Ref Range   Glucose-Capillary 93 65 - 99 mg/dL  Glucose, capillary     Status: None   Collection Time: 07/14/17 12:55 AM  Result Value Ref Range   Glucose-Capillary 99 65 - 99 mg/dL  Glucose, capillary     Status: None   Collection Time: 07/14/17  6:09 AM  Result Value Ref Range   Glucose-Capillary 89 65 - 99 mg/dL  Urinalysis, Routine w reflex microscopic     Status: None   Collection Time: 07/14/17  8:47 AM  Result Value Ref Range   Color, Urine YELLOW YELLOW   APPearance CLEAR CLEAR   Specific Gravity, Urine 1.009 1.005 - 1.030   pH 7.0 5.0 - 8.0   Glucose, UA NEGATIVE NEGATIVE mg/dL   Hgb urine dipstick NEGATIVE NEGATIVE   Bilirubin Urine NEGATIVE NEGATIVE   Ketones, ur NEGATIVE NEGATIVE mg/dL   Protein, ur NEGATIVE NEGATIVE mg/dL   Nitrite NEGATIVE NEGATIVE   Leukocytes, UA NEGATIVE NEGATIVE  Glucose, capillary     Status: None   Collection Time: 07/14/17 12:29 PM  Result Value Ref Range   Glucose-Capillary 99 65 - 99 mg/dL   Comment 1 Notify RN     Current Facility-Administered Medications  Medication Dose Route Frequency Provider Last Rate Last Dose  . chlorhexidine (PERIDEX) 0.12 % solution 15 mL  15 mL Mouth Rinse BID Samuella Cota, MD   15 mL at 07/14/17 1022  . heparin injection 5,000 Units  5,000 Units Subcutaneous Q8H Reubin Milan, MD   5,000 Units at 07/14/17 [phone]  . MEDLINE mouth rinse  15 mL Mouth Rinse q12n4p Samuella Cota, MD   15 mL at 07/13/17 1537  . ondansetron (ZOFRAN) tablet 4 mg  4 mg Oral Q6H PRN Reubin Milan, MD       Or  . ondansetron [facility]) injection 4 mg  4 mg Intravenous Q6H PRN Reubin Milan, MD   4 mg at 07/12/17 1159    Musculoskeletal: Strength & Muscle Tone: decreased Gait & Station: unable to stand Patient leans: N/A  Psychiatric Specialty Exam: Physical Exam as per  history and physical   ROS complaining about depression, life stresses, suicidal ideation and status post suicidal attempt with psychotropic medication unknown amount. Patient denies nausea, vomiting, abdomen pain, shortness of breath and chest pain.  No Fever-chills, No Headache, No changes with Vision or hearing, reports vertigo No problems swallowing food  or Liquids, No Chest pain, Cough or Shortness of Breath, No Abdominal pain, No Nausea or Vommitting, Bowel movements are regular, No Blood in stool or Urine, No dysuria, No new skin rashes or bruises, No new joints pains-aches,  No new weakness, tingling, numbness in any extremity, No recent weight gain or loss, No polyuria, polydypsia or polyphagia,  A full 10 point Review of Systems was done, except as stated above, all other Review of Systems were negative.  Blood pressure 136/71, pulse (!) 55, temperature 98 F (36.7 C), temperature source Oral, resp. rate 17, SpO2 97 %.There is no height or weight on file to calculate BMI.  General Appearance: Bizarre and Guarded  Eye Contact:  Good  Speech:  Slow  Volume:  Decreased  Mood:  Anxious and Depressed  Affect:  Appropriate and Congruent  Thought Process:  Coherent and Goal Directed  Orientation:  Full (Time, Place, and Person)  Thought Content:  Logical and Rumination  Suicidal Thoughts:  Yes.  with intent/plan  Homicidal Thoughts:  No  Memory:  Immediate;   Good Recent;   Fair Remote;   Fair  Judgement:  Impaired  Insight:  Fair  Psychomotor Activity:  Decreased  Concentration:  Concentration: Fair and Attention Span: Fair  Recall:  Good  Fund of Knowledge:  Good  Language:  Good  Akathisia:  Negative  Handed:  Right  AIMS (if indicated):     Assets:  Communication Skills Desire for Improvement Housing Leisure Time Physical Health Resilience Social Support Transportation  ADL's:  Intact  Cognition:  WNL  Sleep:        Treatment Plan Summary: 51 years old  female with history of bipolar disorder and recent admission with intentional drug overdose of psychotropic medication.  Patient cannot contract for Clinical cytogeneticist Patient meets criteria for inpatient psychiatric hospitalization for crisis stabilization, and safety monitoring on medication management for bipolar most recent episode depression  Recommended no psychotropic medication at this time due to recent intentional overdose of unknown amount.  Patient to home psychotropic medications are: Depakote 500 mg twice daily, Zyprexa 20 mg daily at bedtime, Seroquel 300 mg 2 tablets at bedtime, Eskalith 450 mg twice daily and benztropine 2.5 mg at bedtime and propranolol 20 mg thrice a day.   May start home medication when medically stable.  Daily contact with patient to assess and evaluate symptoms and progress in treatment and Medication management  Disposition: Recommend psychiatric Inpatient admission when medically cleared. Supportive therapy provided about ongoing stressors.  Ambrose Finland, MD 07/14/2017 1:17 PM

## 2017-07-14 NOTE — Progress Notes (Signed)
[facility] PROGRESS NOTE    Progress Note  Karman Biswell  KGM:[id number] DOB: [birth year] DOA: 07/11/2017 PCP: Patient, No Pcp Per     Brief Narrative:   Sheri Gatchel is an 51 y.o. female past medical history of bipolar disorder, schizophrenia presents with possible overdose  Assessment/Plan:   Suicide attempt  Intentional overdose of psychotropic / Valproic acid toxicity and acute encephalopathy Chronic medication Depakote, Lasix pain, benztropine, lithium, atenolol and quetiapine. Encephalopathy has resolved, no events on telemetry. Psychiatry has been consulted. Valproic acid levels are normal. Awaiting psychiatry evaluation patient is medically stable to transfer to behavioral health.  Elevated TSH: Asymptomatic will need a repeat in 6 weeks.  Bipolar affective disorder/schizophrenia: Awaiting psychiatry recommendations.   DVT prophylaxis: lovenox Family Communication:none Disposition Plan/Barrier to D/C: transfer to [facility] Code Status:     Code Status Orders        Start     Ordered   07/12/17 0820  Full code  Continuous     07/12/17 0819    Code Status History    Date Active Date Inactive Code Status Order ID Comments User Context   03/25/2017  2:15 AM 03/30/2017  5:06 PM Full Code [id number]  Dalia Heading, PA-C ED   12/09/2015 10:31 PM 12/20/2015  9:58 AM Full Code [id number]  Clovis Fredrickson, MD Inpatient   11/30/2015  3:14 PM 12/09/2015 10:31 PM Full Code [id number]  Roney Jaffe, MD ED   11/30/2015  2:01 PM 11/30/2015  3:14 PM Full Code [id number]  Orlie Dakin, MD ED   07/03/2015  7:06 PM 07/05/2015 10:04 PM Full Code [id number]  Dalia Heading, PA-C ED   06/13/2015 10:28 PM 06/16/2015  4:22 PM Full Code [id number]  [name], MD Inpatient   06/13/2015  7:48 PM 06/13/2015 10:28 PM Full Code [id number]  Dalia Heading, PA-C ED        IV Access:    Peripheral IV   Procedures and diagnostic studies:   No results  found.   Medical Consultants:    None.  Anti-Infectives:   None  Subjective:    Zella Richer no new complaints.  Objective:    Vitals:   07/13/17 0612 07/13/17 1346 07/13/17 2027 07/14/17 0530  BP: 117/65 130/67 139/71 136/71  Pulse: 77 72 72 (!) 55  Resp: 16 18 16 17   Temp: 98.3 F (36.8 C) 98 F (36.7 C) 97.7 F (36.5 C) 98 F (36.7 C)  TempSrc: Oral Oral Oral Oral  SpO2: 99% 99% 98% 97%    Intake/Output Summary (Last 24 hours) at 07/14/17 0718 Last data filed at 07/13/17 1830  Gross per 24 hour  Intake             1200 ml  Output                0 ml  Net             1200 ml   There were no vitals filed for this visit.  Exam: General exam: Awake alert and oriented 3 Respiratory system: good Air movement clear to auscultation. Cardiovascular system: Regular rate and rhythm with a positive S1-S2 no murmurs rubs gallops. Gastrointestinal system: Positive bowel sounds soft nontender nondistended Central nervous system: Alert and oriented 3. Extremities: No lower extremity edema. Skin: No rashes, lesions or ulcers Psychiatry: Judgment and insight appeared normal.   Data Reviewed:    Labs: Basic Metabolic Panel:  Recent Labs Lab 07/11/17 2346 07/11/17 2347 07/12/17 1340 07/13/17 8315  NA 133*  --  145 147*  K 3.2*  --  4.4 4.1  CL 96*  --  111 114*  CO2 25  --  25 27  GLUCOSE 124*  --  94 87  BUN 17  --  14 14  CREATININE 1.13*  --  0.93 0.96  CALCIUM 10.7*  --  9.9 9.5  MG  --  1.7  --  2.3  PHOS  --  3.7  --  4.0   GFR CrCl cannot be calculated (Unknown ideal weight.). Liver Function Tests:  Recent Labs Lab 07/11/17 2346 07/12/17 1340 07/13/17 0632  AST 24 18 15   ALT 22 19 16   ALKPHOS 45 39 33*  BILITOT 0.9 0.3 0.6  PROT 7.9 7.2 6.2*  ALBUMIN 4.7 4.2 3.6   No results for input(s): LIPASE, AMYLASE in the last 168 hours.  Recent Labs Lab 07/12/17 0145 07/13/17 0632  AMMONIA 37* 40*   Coagulation profile  Recent  Labs Lab 07/11/17 2347  INR 0.92    CBC:  Recent Labs Lab 07/11/17 2346 07/13/17 0632  WBC 7.9 4.0  NEUTROABS  --  1.3*  HGB 13.9 12.5  HCT 38.7 37.6  MCV 79.0 83.2  PLT 179 149*   Cardiac Enzymes:  Recent Labs Lab 07/11/17 2347 07/12/17 1340  CKTOTAL  --  85  TROPONINI <0.03  --    BNP (last 3 results) No results for input(s): PROBNP in the last 8760 hours. CBG:  Recent Labs Lab 07/13/17 0548 07/13/17 1145 07/13/17 1709 07/14/17 0055 07/14/17 0609  GLUCAP 82 83 93 99 89   D-Dimer: No results for input(s): DDIMER in the last 72 hours. Hgb A1c: No results for input(s): HGBA1C in the last 72 hours. Lipid Profile: No results for input(s): CHOL, HDL, LDLCALC, TRIG, CHOLHDL, LDLDIRECT in the last 72 hours. Thyroid function studies:  Recent Labs  07/12/17 1340  TSH 0.189*   Anemia work up: No results for input(s): VITAMINB12, FOLATE, FERRITIN, TIBC, IRON, RETICCTPCT in the last 72 hours. Sepsis Labs:  Recent Labs Lab 07/11/17 2346 07/13/17 0632  WBC 7.9 4.0   Microbiology No results found for this or any previous visit (from the past 240 hour(s)).   Medications:   . chlorhexidine  15 mL Mouth Rinse BID  . heparin  5,000 Units Subcutaneous Q8H  . mouth rinse  15 mL Mouth Rinse q12n4p   Continuous Infusions:    LOS: 2 days   Charlynne Cousins  [facility] Pager [phone]  *Please refer to [email or web address], password [email or web address] to get updated schedule on who will round on this patient, as hospitalists switch teams weekly. If 7PM-7AM, please contact night-coverage at [email or web address], password [email or web address] for any overnight needs.  07/14/2017, 7:18 AM

## 2017-07-15 LAB — GLUCOSE, CAPILLARY
Glucose-Capillary: 88 mg/dL (ref 65–99)
Glucose-Capillary: 107 mg/dL — ABNORMAL HIGH (ref 65–99)
Glucose-Capillary: 127 mg/dL — ABNORMAL HIGH (ref 65–99)

## 2017-07-15 LAB — T3: T3, Total: 103 ng/dL (ref 71–180)

## 2017-07-15 NOTE — Social Work (Signed)
CSW reviewed chart and saw psych notes recommending patient for inpatient psych.  CSW called [facility] who will review and follow-up for placement if bed available.  CSW will continue to follow.  Elissa Hefty, LCSW Clinical Social Worker [phone]

## 2017-07-15 NOTE — Progress Notes (Signed)
No change noted in pt. status from the previous assessment. Pt. Is asleep in bed with a sitter @ the bedside.

## 2017-07-15 NOTE — Clinical Social Work Note (Signed)
Clinical Social Work Assessment  Patient Details  Name: Paula Dickson MRN: [id number] Date of Birth: February 09, 1966  Date of referral:  07/15/17               Reason for consult:  Facility Placement, Suicide Risk/Attempt                Permission sought to share information with:  Psychiatrist Permission granted to share information::  Yes, Verbal Permission Granted  Name::        Agency::  [facility]   Relationship::     Contact Information:     Housing/Transportation Living arrangements for the past 2 months:  Single Family Home Source of Information:  Patient Patient Interpreter Needed:  None Criminal Activity/Legal Involvement Pertinent to Current Situation/Hospitalization:  No - Comment as needed Significant Relationships:  Parents Lives with:  Parents Do you feel safe going back to the place where you live?  No Need for family participation in patient care:  No (Coment)  Care giving concerns:  Pt from home with elderly father and mom is deceased. Mom had dementia, unclear if mom had other mental health diagnosis.  Pt confirmed that she has had mental health needs since high school. She endorsed long history of treatment and hospitalization in her health history.  She is single, no children and has always resided at home with parents.   Patient is amenable to going to [facility] through [facility].  Pt identifies her struggles with making poor choices and needing help with managing her thoughts and urges.  She has some insight into her mental state. Patient declines to go to in patient psych that is out of [place] area. CSW explained possible barriers. Pt adamant that she does not want to consider [facility].  CSW called [facility] and she will f/u. No other referrals sent out at this time.  Social Worker assessment / plan:  CSW will continue to follow for placement [facility] placement. If no beds then will fax out to additional psych facilities as patient really wants to stay in [place].    Employment status:  Disabled (Comment on whether or not currently receiving Disability) Insurance information:  Medicare PT Recommendations:  No Follow Up Information / Referral to community resources:  Inpatient Psychiatric Care (Comment Required) [facility] contacted for placement)  Patient/Family's Response to care:  Patient appreciative of CSW assistance with placement. She reports no issues with care.  Patient/Family's Understanding of and Emotional Response to Diagnosis, Current Treatment, and Prognosis:  Patient has good understanding of her diagnosis, current treatment and prognosis. She is insightful about her mental health needs. She is willing to transition to higher level of care for mental health stability. No issues or concerns identified at this time.  Emotional Assessment Appearance:  Appears stated age Attitude/Demeanor/Rapport:   (cooperative) Affect (typically observed):  Accepting, Appropriate Orientation:  Oriented to Self, Oriented to Place, Oriented to  Time, Oriented to Situation Alcohol / Substance use:  Not Applicable Psych involvement (Current and /or in the community):  No (Comment)  Discharge Needs  Concerns to be addressed:  Mental Health Concerns, Care Coordination Readmission within the last 30 days:  No Current discharge risk:  Psychiatric Illness Barriers to Discharge:  Other (Awaiting [facility] psych bed )   Normajean Baxter, LCSW 07/15/2017, 12:28 PM

## 2017-07-16 DIAGNOSIS — F314 Bipolar disorder, current episode depressed, severe, without psychotic features: Secondary | ICD-10-CM

## 2017-07-16 DIAGNOSIS — T50901A Poisoning by unspecified drugs, medicaments and biological substances, accidental (unintentional), initial encounter: Secondary | ICD-10-CM

## 2017-07-16 LAB — GLUCOSE, CAPILLARY
Glucose-Capillary: 110 mg/dL — ABNORMAL HIGH (ref 65–99)
Glucose-Capillary: 111 mg/dL — ABNORMAL HIGH (ref 65–99)
Glucose-Capillary: 119 mg/dL — ABNORMAL HIGH (ref 65–99)
Glucose-Capillary: 126 mg/dL — ABNORMAL HIGH (ref 65–99)

## 2017-07-16 MED ORDER — DIVALPROEX SODIUM 125 MG PO CSDR: 62500.0000 mg | DELAYED_RELEASE_CAPSULE | Freq: Two times a day (BID) | ORAL | Status: DC

## 2017-07-16 MED ORDER — QUETIAPINE FUMARATE ER 300 MG PO TB24
600.0000 mg | ORAL_TABLET | Freq: Every day | ORAL | Status: DC
Start: 2017-07-16 — End: 2017-07-17
  Administered 2017-07-16: 600 mg via ORAL
  Filled 2017-07-16: qty 2

## 2017-07-16 MED ORDER — LITHIUM CARBONATE ER 450 MG PO TBCR
450.0000 mg | EXTENDED_RELEASE_TABLET | Freq: Two times a day (BID) | ORAL | Status: DC
  Administered 2017-07-16 – 2017-07-17 (×3): 450 mg via ORAL
  Filled 2017-07-16 (×3): qty 1

## 2017-07-16 MED ORDER — OLANZAPINE 10 MG PO TABS
20.0000 mg | ORAL_TABLET | Freq: Every day | ORAL | Status: DC
  Administered 2017-07-16 – 2017-07-17 (×2): 20 mg via ORAL
  Filled 2017-07-16 (×2): qty 2

## 2017-07-16 MED ORDER — BENZTROPINE MESYLATE 0.5 MG PO TABS
0.5000 mg | ORAL_TABLET | Freq: Every day | ORAL | Status: DC
  Administered 2017-07-16: 0.5 mg via ORAL
  Filled 2017-07-16: qty 1

## 2017-07-16 MED ORDER — AMLODIPINE BESYLATE 5 MG PO TABS
5.0000 mg | ORAL_TABLET | Freq: Every day | ORAL | Status: DC
  Administered 2017-07-16 – 2017-07-17 (×2): 5 mg via ORAL
  Filled 2017-07-16 (×2): qty 1

## 2017-07-16 MED ORDER — DIVALPROEX SODIUM 125 MG PO CSDR
125.0000 mg | DELAYED_RELEASE_CAPSULE | Freq: Two times a day (BID) | ORAL | Status: DC
  Administered 2017-07-16 – 2017-07-17 (×3): 125 mg via ORAL
  Filled 2017-07-16 (×3): qty 1

## 2017-07-16 NOTE — Progress Notes (Signed)
Patient has been accepted to [facility].  Accepting: Dr. Jonelle Sports Nurse call report to: [phone] Patient can arrive anytime after 10:00am CSW met with patient at bedside, explain role and discussed voluntary consent.  Voluntary signed and faxed to: [phone] [facility] to transport.    Kathrin Greathouse, Latanya Presser, MSW Clinical Social Worker 5E and Psychiatric Service Line [phone] 07/16/2017  3:12 PM

## 2017-07-16 NOTE — Progress Notes (Signed)
Chaplain providing support at pt request.   Fearful of returning to [facility] due to previous experience (not specific).  Chaplain provided support around faith resources for fear / uncertainty.  Shared prayers with pt.

## 2017-07-16 NOTE — Progress Notes (Signed)
[facility] PROGRESS NOTE    Progress Note  Paula Dickson  ASN:[id number] DOB: 11-13-1966 DOA: 07/11/2017 PCP: Patient, No Pcp Per     Brief Narrative:   Paula Dickson is an 51 y.o. female past medical history of bipolar disorder, schizophrenia presents with possible overdose  Assessment/Plan:   Suicide attempt  Intentional overdose of psychotropic / Valproic acid toxicity and acute encephalopathy Chronic medication Depakote, Lasix pain, benztropine, lithium, atenolol and quetiapine. Encephalopathy has resolved. Psychiatry has been consulted. Valproic acid levels are normal. Patient medically stable to be transferred to behavioral health.  Elevated TSH: Asymptomatic will need a repeat in 6 weeks.  Bipolar affective disorder/schizophrenia: Psychiatry recommended inpatient psychiatric evaluation.   DVT prophylaxis: lovenox Family Communication:none Disposition Plan/Barrier to D/C: transfer to [facility] Code Status:     Code Status Orders        Start     Ordered   07/12/17 0820  Full code  Continuous     07/12/17 0819    Code Status History    Date Active Date Inactive Code Status Order ID Comments User Context   03/25/2017  2:15 AM 03/30/2017  5:06 PM Full Code [id number]  Dalia Heading, PA-C ED   12/09/2015 10:31 PM 12/20/2015  9:58 AM Full Code [id number]  Clovis Fredrickson, MD Inpatient   11/30/2015  3:14 PM 12/09/2015 10:31 PM Full Code [id number]  Roney Jaffe, MD ED   11/30/2015  2:01 PM 11/30/2015  3:14 PM Full Code [id number]  Orlie Dakin, MD ED   07/03/2015  7:06 PM 07/05/2015 10:04 PM Full Code [id number]  Dalia Heading, PA-C ED   06/13/2015 10:28 PM 06/16/2015  4:22 PM Full Code [id number]  [name], MD Inpatient   06/13/2015  7:48 PM 06/13/2015 10:28 PM Full Code [id number]  Dalia Heading, PA-C ED        IV Access:    Peripheral IV   Procedures and diagnostic studies:   No results found.   Medical Consultants:     None.  Anti-Infectives:   None  Subjective:    Paula Dickson no new complains.  Objective:    Vitals:   07/15/17 1351 07/15/17 1741 07/15/17 2131 07/16/17 0525  BP: (!) 166/85 (!) 156/85 (!) 157/82 (!) 174/98  Pulse: 77 77 74 96  Resp: 18 20 20 18   Temp: 98.8 F (37.1 C)  98.7 F (37.1 C) 98.1 F (36.7 C)  TempSrc: Oral  Oral Oral  SpO2: 100% 99% 100% 100%    Intake/Output Summary (Last 24 hours) at 07/16/17 0753 Last data filed at 07/15/17 1843  Gross per 24 hour  Intake             1920 ml  Output              120 ml  Net             1800 ml   There were no vitals filed for this visit.  Exam: General exam: Awake alert and oriented 3 Respiratory system: good Air movement clear to auscultation. Cardiovascular system: Regular rate and rhythm with a positive S1-S2 no murmurs rubs gallops. Gastrointestinal system: Positive bowel sounds soft nontender nondistended Central nervous system: Alert and oriented 3. Extremities: No lower extremity edema. Skin: No rashes, lesions or ulcers Psychiatry: Judgment and insight appeared normal.   Data Reviewed:    Labs: Basic Metabolic Panel:  Recent Labs Lab 07/11/17 2346 07/11/17 2347 07/12/17 1340 07/13/17 0632  NA 133*  --  145  147*  K 3.2*  --  4.4 4.1  CL 96*  --  111 114*  CO2 25  --  25 27  GLUCOSE 124*  --  94 87  BUN 17  --  14 14  CREATININE 1.13*  --  0.93 0.96  CALCIUM 10.7*  --  9.9 9.5  MG  --  1.7  --  2.3  PHOS  --  3.7  --  4.0   GFR CrCl cannot be calculated (Unknown ideal weight.). Liver Function Tests:  Recent Labs Lab 07/11/17 2346 07/12/17 1340 07/13/17 0632  AST 24 18 15   ALT 22 19 16   ALKPHOS 45 39 33*  BILITOT 0.9 0.3 0.6  PROT 7.9 7.2 6.2*  ALBUMIN 4.7 4.2 3.6   No results for input(s): LIPASE, AMYLASE in the last 168 hours.  Recent Labs Lab 07/12/17 0145 07/13/17 0632  AMMONIA 37* 40*   Coagulation profile  Recent Labs Lab 07/11/17 2347  INR 0.92     CBC:  Recent Labs Lab 07/11/17 2346 07/13/17 0632  WBC 7.9 4.0  NEUTROABS  --  1.3*  HGB 13.9 12.5  HCT 38.7 37.6  MCV 79.0 83.2  PLT 179 149*   Cardiac Enzymes:  Recent Labs Lab 07/11/17 2347 07/12/17 1340  CKTOTAL  --  85  TROPONINI <0.03  --    BNP (last 3 results) No results for input(s): PROBNP in the last 8760 hours. CBG:  Recent Labs Lab 07/15/17 0559 07/15/17 1215 07/15/17 1740 07/15/17 2339 07/16/17 0537  GLUCAP 88 107* 127* 119* 126*   D-Dimer: No results for input(s): DDIMER in the last 72 hours. Hgb A1c: No results for input(s): HGBA1C in the last 72 hours. Lipid Profile: No results for input(s): CHOL, HDL, LDLCALC, TRIG, CHOLHDL, LDLDIRECT in the last 72 hours. Thyroid function studies: No results for input(s): TSH, T4TOTAL, T3FREE, THYROIDAB in the last 72 hours.  Invalid input(s): FREET3 Anemia work up: No results for input(s): VITAMINB12, FOLATE, FERRITIN, TIBC, IRON, RETICCTPCT in the last 72 hours. Sepsis Labs:  Recent Labs Lab 07/11/17 2346 07/13/17 0632  WBC 7.9 4.0   Microbiology No results found for this or any previous visit (from the past 240 hour(s)).   Medications:   . amLODipine  5 mg Oral Daily  . chlorhexidine  15 mL Mouth Rinse BID  . heparin  5,000 Units Subcutaneous Q8H  . mouth rinse  15 mL Mouth Rinse q12n4p   Continuous Infusions:    LOS: 4 days   Charlynne Cousins  [facility] Pager [phone]  *Please refer to [email or web address], password [email or web address] to get updated schedule on who will round on this patient, as hospitalists switch teams weekly. If 7PM-7AM, please contact night-coverage at [email or web address], password [email or web address] for any overnight needs.  07/16/2017, 7:53 AM

## 2017-07-16 NOTE — Progress Notes (Signed)
CSW following for discharge need to inpatient psychiatric facility. Will inform medical staff when a bed is available.   Kathrin Greathouse, Latanya Presser, MSW Clinical Social Worker 5E and Psychiatric Service Line [phone] 07/16/2017  10:54 AM

## 2017-07-17 DIAGNOSIS — E876 Hypokalemia: Secondary | ICD-10-CM | POA: Diagnosis not present

## 2017-07-17 DIAGNOSIS — Z915 Personal history of self-harm: Secondary | ICD-10-CM | POA: Diagnosis not present

## 2017-07-17 DIAGNOSIS — G9340 Encephalopathy, unspecified: Secondary | ICD-10-CM | POA: Diagnosis not present

## 2017-07-17 DIAGNOSIS — G92 Toxic encephalopathy: Secondary | ICD-10-CM | POA: Diagnosis not present

## 2017-07-17 DIAGNOSIS — Z9114 Patient's other noncompliance with medication regimen: Secondary | ICD-10-CM | POA: Diagnosis not present

## 2017-07-17 DIAGNOSIS — I10 Essential (primary) hypertension: Secondary | ICD-10-CM | POA: Diagnosis present

## 2017-07-17 DIAGNOSIS — T426X2A Poisoning by other antiepileptic and sedative-hypnotic drugs, intentional self-harm, initial encounter: Secondary | ICD-10-CM | POA: Diagnosis not present

## 2017-07-17 DIAGNOSIS — F314 Bipolar disorder, current episode depressed, severe, without psychotic features: Secondary | ICD-10-CM | POA: Diagnosis not present

## 2017-07-17 DIAGNOSIS — F319 Bipolar disorder, unspecified: Secondary | ICD-10-CM | POA: Diagnosis not present

## 2017-07-17 DIAGNOSIS — T1491XA Suicide attempt, initial encounter: Secondary | ICD-10-CM | POA: Diagnosis not present

## 2017-07-17 DIAGNOSIS — E871 Hypo-osmolality and hyponatremia: Secondary | ICD-10-CM | POA: Diagnosis not present

## 2017-07-17 DIAGNOSIS — T50901A Poisoning by unspecified drugs, medicaments and biological substances, accidental (unintentional), initial encounter: Secondary | ICD-10-CM | POA: Diagnosis not present

## 2017-07-17 DIAGNOSIS — F3181 Bipolar II disorder: Secondary | ICD-10-CM | POA: Diagnosis present

## 2017-07-17 LAB — GLUCOSE, CAPILLARY
Glucose-Capillary: 100 mg/dL — ABNORMAL HIGH (ref 65–99)
Glucose-Capillary: 108 mg/dL — ABNORMAL HIGH (ref 65–99)

## 2017-07-17 MED ORDER — DIVALPROEX SODIUM 125 MG PO CSDR: 125.0000 mg | DELAYED_RELEASE_CAPSULE | Freq: Two times a day (BID) | ORAL | Status: DC

## 2017-07-17 NOTE — Care Management Important Message (Signed)
Important Message  Patient Details  Name: Ariyanah Aguado MRN: [id number] Date of Birth: [birth year]   Medicare Important Message Given:  Yes    Kerin Salen 07/17/2017, 9:41 AMImportant Message  Patient Details  Name: Avyanna Spada MRN: [id number] Date of Birth: December 03, 1965   Medicare Important Message Given:  Yes    Kerin Salen 07/17/2017, 9:41 AM

## 2017-07-17 NOTE — Care Management Note (Signed)
Case Management Note  Patient Details  Name: Paula Dickson MRN: [id number] Date of Birth: 12/04/1965  Subjective/Objective:                  Awaiting psych bed at [facility]  Action/Plan: Date:  July 17, 2017 Chart reviewed for concurrent status and case management needs. Will continue to follow patient progress. Discharge Planning: following for needs Expected discharge date: [id number] Velva Harman, BSN, [place], [place]  Expected Discharge Date:                Expected Discharge Plan:  Home/Self Care  In-House Referral:  Clinical Social Work  Discharge planning Services  CM Consult  Post Acute Care Choice:    Choice offered to:     DME Arranged:    DME Agency:     HH Arranged:    [place] Agency:     Status of Service:  In process, will continue to follow  If discussed at Long Length of Stay Meetings, dates discussed:    Additional Comments:  Leeroy Cha, RN 07/17/2017, 8:22 AM

## 2017-07-17 NOTE — Discharge Summary (Signed)
Physician Discharge Summary  Paula Dickson QZE:[id number] DOB: 12/11/1965 DOA: 07/11/2017  PCP: Patient, No Pcp Per  Admit date: 07/11/2017 Discharge date: 07/17/2017  Admitted From: home Disposition:  Home  Recommendations for Outpatient Follow-up:  1. Transfer to Inpatient psychiatric facility  Home Health:No Equipment/Devices:none  Discharge Condition:stable CODE STATUS:full Diet recommendation: Heart Healthy   Brief/Interim Summary: 51 year old with history of bipolar disorder schizophrenia presents with intentional psychotropic medications overdose.  Discharge Diagnoses:  Principal Problem:   Suicide attempt [facility]) Active Problems:   Overdose of psychotropic   Valproic acid toxicity   Schizophrenia ([place])   Bipolar affective disorder (HCC)   Low TSH level   Toxic encephalopathy  Suicide attempt with intentional overdose of psychotropic medication valproic acid acid leading to toxic encephalopathy: She was treated symptomatically with IV fluids and nothing by mouth, she had no events on telemetry She remained hemodynamically stable, blood which showed no sequel. Psychiatry was consulted and recommended inpatient psych. No changes since 07/15/2017. She has remained psychiatrically stable, continues to have suicidal ideations.  Valproic acid toxicity: Now resolved levels within normal. These was resumed orally.  Elevated TSH: Rodena Piety be repeated in 6 weeks as an outpatient.  Bipolar affective disorder/schizophrenia: Continue current regimen.  Discharge Instructions  Discharge Instructions    Diet - low sodium heart healthy    Complete by:  As directed    Diet - low sodium heart healthy    Complete by:  As directed    Increase activity slowly    Complete by:  As directed    Increase activity slowly    Complete by:  As directed      Allergies as of 07/17/2017   No Known Allergies     Medication List    TAKE these medications   amLODipine 5 MG  tablet Commonly known as:  NORVASC TAKE 1 TABLET BY MOUTH EVERY DAY What changed:  See the new instructions.   benztropine 0.5 MG tablet Commonly known as:  COGENTIN Take 1 tablet (0.5 mg total) by mouth at bedtime.   divalproex 125 MG capsule Commonly known as:  DEPAKOTE SPRINKLE Take 1 capsule (125 mg total) by mouth 2 (two) times daily. What changed:  how much to take   lithium carbonate 450 MG CR tablet Commonly known as:  ESKALITH Take 1 tablet (450 mg total) by mouth every 12 (twelve) hours.   OLANZapine 20 MG tablet Commonly known as:  ZYPREXA Take 20 mg by mouth daily.   propranolol 20 MG tablet Commonly known as:  INDERAL Take 1 tablet (20 mg total) by mouth 3 (three) times daily.   QUEtiapine 300 MG 24 hr tablet Commonly known as:  SEROQUEL XR Take 2 tablets (600 mg total) by mouth at bedtime.       No Known Allergies  Consultations:  Psyq   Procedures/Studies: Ct Head Wo Contrast  Result Date: 07/12/2017 CLINICAL DATA:  Acute onset of altered level of consciousness. Difficulty with ambulation and speech. Initial encounter. EXAM: CT HEAD WITHOUT CONTRAST TECHNIQUE: Contiguous axial images were obtained from the base of the skull through the vertex without intravenous contrast. COMPARISON:  None. FINDINGS: Brain: No evidence of acute infarction, hemorrhage, hydrocephalus, extra-axial collection or mass lesion/mass effect. The posterior fossa, including the cerebellum, brainstem and fourth ventricle, is within normal limits. The third and lateral ventricles, and basal ganglia are unremarkable in appearance. The cerebral hemispheres are symmetric in appearance, with normal gray-white differentiation. No mass effect or midline shift is seen. Vascular: No  hyperdense vessel or unexpected calcification. Skull: There is no evidence of fracture; visualized osseous structures are unremarkable in appearance. Sinuses/Orbits: The orbits are within normal limits. The paranasal  sinuses and mastoid air cells are well-aerated. Other: No significant soft tissue abnormalities are seen. IMPRESSION: Unremarkable noncontrast CT of the head. Electronically Signed   By: Garald Balding M.D.   On: 07/12/2017 02:44     Subjective: She relates no new complaints tolerated her diet.  Discharge Exam: Vitals:   07/16/17 2104 07/17/17 0500  BP: (!) 148/95 126/72  Pulse: (!) 108 77  Resp:    Temp: 98.8 F (37.1 C) 98.1 F (36.7 C)  SpO2: 99% 99%   Vitals:   07/16/17 1342 07/16/17 2104 07/17/17 0500 07/17/17 0900  BP: (!) 129/97 (!) 148/95 126/72   Pulse: (!) 114 (!) 108 77   Resp: 20     Temp: 98.8 F (37.1 C) 98.8 F (37.1 C) 98.1 F (36.7 C)   TempSrc:  Oral Oral   SpO2: 99% 99% 99%   Weight:    61.2 kg (135 lb)  Height:    5\' 1"  (1.549 m)    General: In no acute distress. Cardiovascular: regular rate and  rhythm with positive S1 and S2. Respiratory: She has good air movement and clear to auscultation. Abdominal: Soft, NT, ND, bowel sounds + Extremities: No lower extremity edema.    The results of significant diagnostics from this hospitalization (including imaging, microbiology, ancillary and laboratory) are listed below for reference.     Microbiology: No results found for this or any previous visit (from the past 240 hour(s)).   Labs: BNP (last 3 results) No results for input(s): BNP in the last 8760 hours. Basic Metabolic Panel:  Recent Labs Lab 07/11/17 2346 07/11/17 2347 07/12/17 1340 07/13/17 0632  NA 133*  --  145 147*  K 3.2*  --  4.4 4.1  CL 96*  --  111 114*  CO2 25  --  25 27  GLUCOSE 124*  --  94 87  BUN 17  --  14 14  CREATININE 1.13*  --  0.93 0.96  CALCIUM 10.7*  --  9.9 9.5  MG  --  1.7  --  2.3  PHOS  --  3.7  --  4.0   Liver Function Tests:  Recent Labs Lab 07/11/17 2346 07/12/17 1340 07/13/17 0632  AST 24 18 15   ALT 22 19 16   ALKPHOS 45 39 33*  BILITOT 0.9 0.3 0.6  PROT 7.9 7.2 6.2*  ALBUMIN 4.7 4.2 3.6    No results for input(s): LIPASE, AMYLASE in the last 168 hours.  Recent Labs Lab 07/12/17 0145 07/13/17 0632  AMMONIA 37* 40*   CBC:  Recent Labs Lab 07/11/17 2346 07/13/17 0632  WBC 7.9 4.0  NEUTROABS  --  1.3*  HGB 13.9 12.5  HCT 38.7 37.6  MCV 79.0 83.2  PLT 179 149*   Cardiac Enzymes:  Recent Labs Lab 07/11/17 2347 07/12/17 1340  CKTOTAL  --  85  TROPONINI <0.03  --    BNP: Invalid input(s): POCBNP CBG:  Recent Labs Lab 07/16/17 0537 07/16/17 1135 07/16/17 1705 07/17/17 0023 07/17/17 0550  GLUCAP 126* 110* 111* 108* 100*   D-Dimer No results for input(s): DDIMER in the last 72 hours. Hgb A1c No results for input(s): HGBA1C in the last 72 hours. Lipid Profile No results for input(s): CHOL, HDL, LDLCALC, TRIG, CHOLHDL, LDLDIRECT in the last 72 hours. Thyroid function studies No  results for input(s): TSH, T4TOTAL, T3FREE, THYROIDAB in the last 72 hours.  Invalid input(s): FREET3 Anemia work up No results for input(s): VITAMINB12, FOLATE, FERRITIN, TIBC, IRON, RETICCTPCT in the last 72 hours. Urinalysis    Component Value Date/Time   COLORURINE YELLOW 07/14/2017 0847   APPEARANCEUR CLEAR 07/14/2017 0847   LABSPEC 1.009 07/14/2017 0847   PHURINE 7.0 07/14/2017 0847   GLUCOSEU NEGATIVE 07/14/2017 0847   HGBUR NEGATIVE 07/14/2017 0847   BILIRUBINUR NEGATIVE 07/14/2017 0847   BILIRUBINUR neg 12/27/2015 1421   KETONESUR NEGATIVE 07/14/2017 0847   PROTEINUR NEGATIVE 07/14/2017 0847   UROBILINOGEN 0.2 12/27/2015 1421   UROBILINOGEN 0.2 06/14/2015 0040   NITRITE NEGATIVE 07/14/2017 0847   LEUKOCYTESUR NEGATIVE 07/14/2017 0847   Sepsis Labs Invalid input(s): PROCALCITONIN,  WBC,  LACTICIDVEN Microbiology No results found for this or any previous visit (from the past 240 hour(s)).   Time coordinating discharge: Over 30 minutes  SIGNED:   Charlynne Cousins, MD  [facility] 07/17/2017, 9:59 AM Pager   If 7PM-7AM, please contact  night-coverage [email or web address] Password [email or web address]

## 2017-07-17 NOTE — Progress Notes (Addendum)
Confirm w/ [facility] at [facility] ready to accept.  CSW updated patient sister and father about pt. discharge to [facility] Nurse given number to call report.  [facility] called.   Kathrin Greathouse, Latanya Presser, MSW Clinical Social Worker 5E and Psychiatric Service Line [phone] 07/17/2017  10:39 AM

## 2017-07-26 DIAGNOSIS — F209 Schizophrenia, unspecified: Secondary | ICD-10-CM | POA: Diagnosis not present

## 2017-07-26 DIAGNOSIS — F250 Schizoaffective disorder, bipolar type: Secondary | ICD-10-CM | POA: Diagnosis not present

## 2017-08-02 DIAGNOSIS — Z79899 Other long term (current) drug therapy: Secondary | ICD-10-CM | POA: Diagnosis not present

## 2017-08-06 DIAGNOSIS — F209 Schizophrenia, unspecified: Secondary | ICD-10-CM | POA: Diagnosis not present

## 2017-08-06 DIAGNOSIS — F250 Schizoaffective disorder, bipolar type: Secondary | ICD-10-CM | POA: Diagnosis not present

## 2017-08-08 DIAGNOSIS — Z23 Encounter for immunization: Secondary | ICD-10-CM | POA: Diagnosis not present

## 2017-08-09 DIAGNOSIS — F250 Schizoaffective disorder, bipolar type: Secondary | ICD-10-CM | POA: Diagnosis not present

## 2017-08-09 DIAGNOSIS — F209 Schizophrenia, unspecified: Secondary | ICD-10-CM | POA: Diagnosis not present

## 2017-08-13 ENCOUNTER — Encounter: Payer: Self-pay | Admitting: Gastroenterology

## 2017-08-13 DIAGNOSIS — Z79899 Other long term (current) drug therapy: Secondary | ICD-10-CM | POA: Diagnosis not present

## 2017-08-15 DIAGNOSIS — M2011 Hallux valgus (acquired), right foot: Secondary | ICD-10-CM | POA: Diagnosis not present

## 2017-08-15 DIAGNOSIS — M79671 Pain in right foot: Secondary | ICD-10-CM | POA: Diagnosis not present

## 2017-08-15 DIAGNOSIS — M79672 Pain in left foot: Secondary | ICD-10-CM | POA: Diagnosis not present

## 2017-08-15 DIAGNOSIS — M2012 Hallux valgus (acquired), left foot: Secondary | ICD-10-CM | POA: Diagnosis not present

## 2017-08-16 DIAGNOSIS — F209 Schizophrenia, unspecified: Secondary | ICD-10-CM | POA: Diagnosis not present

## 2017-08-16 DIAGNOSIS — F250 Schizoaffective disorder, bipolar type: Secondary | ICD-10-CM | POA: Diagnosis not present

## 2017-08-18 IMAGING — CR DG CHEST 2V
2 series · 2 of 2 positions shown · non-contrast
Comparison: None.

CLINICAL DATA: Chest pain for 1 month. Heart murmur. Hypertension.

EXAM:
CHEST  2 VIEW

[chest pa]
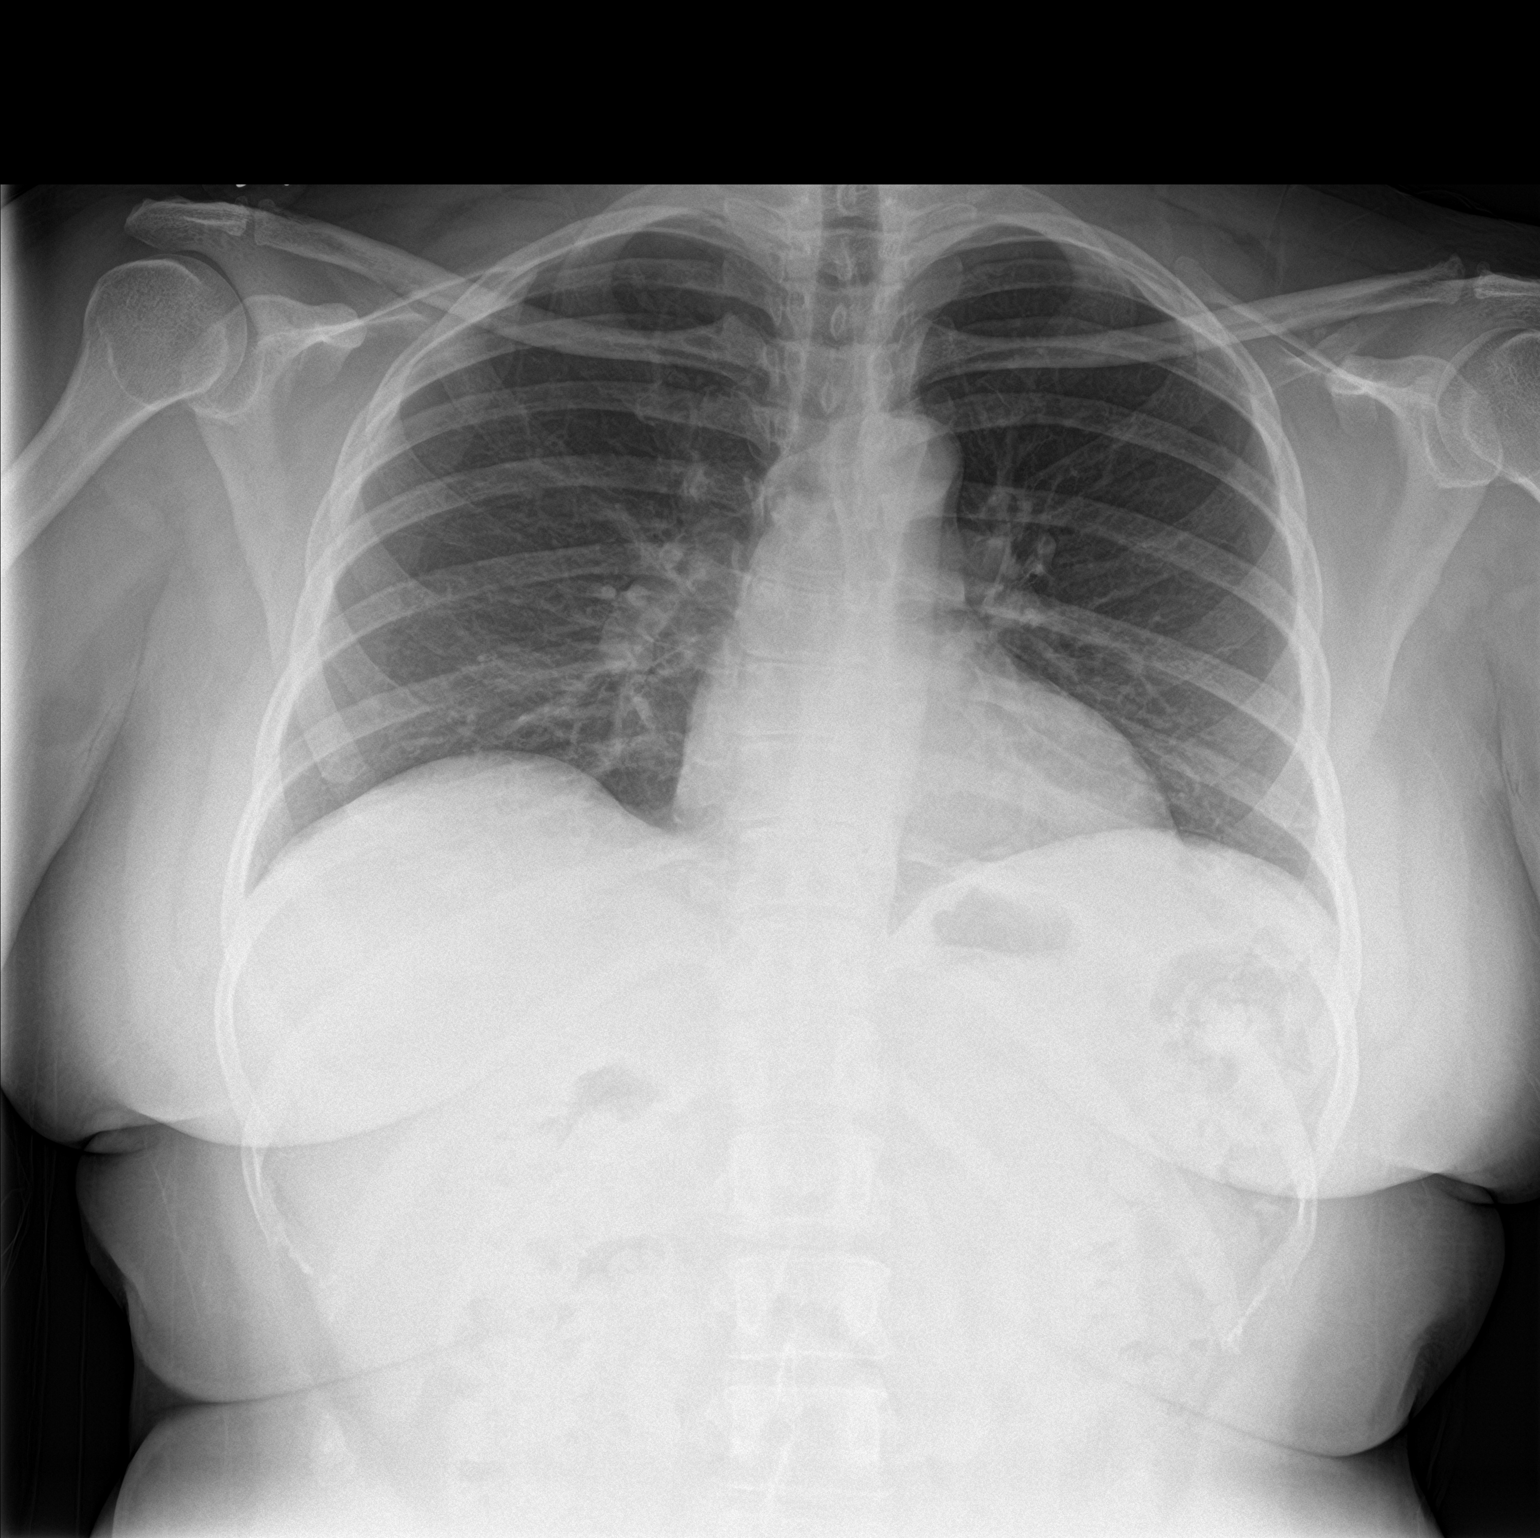

[chest lat]
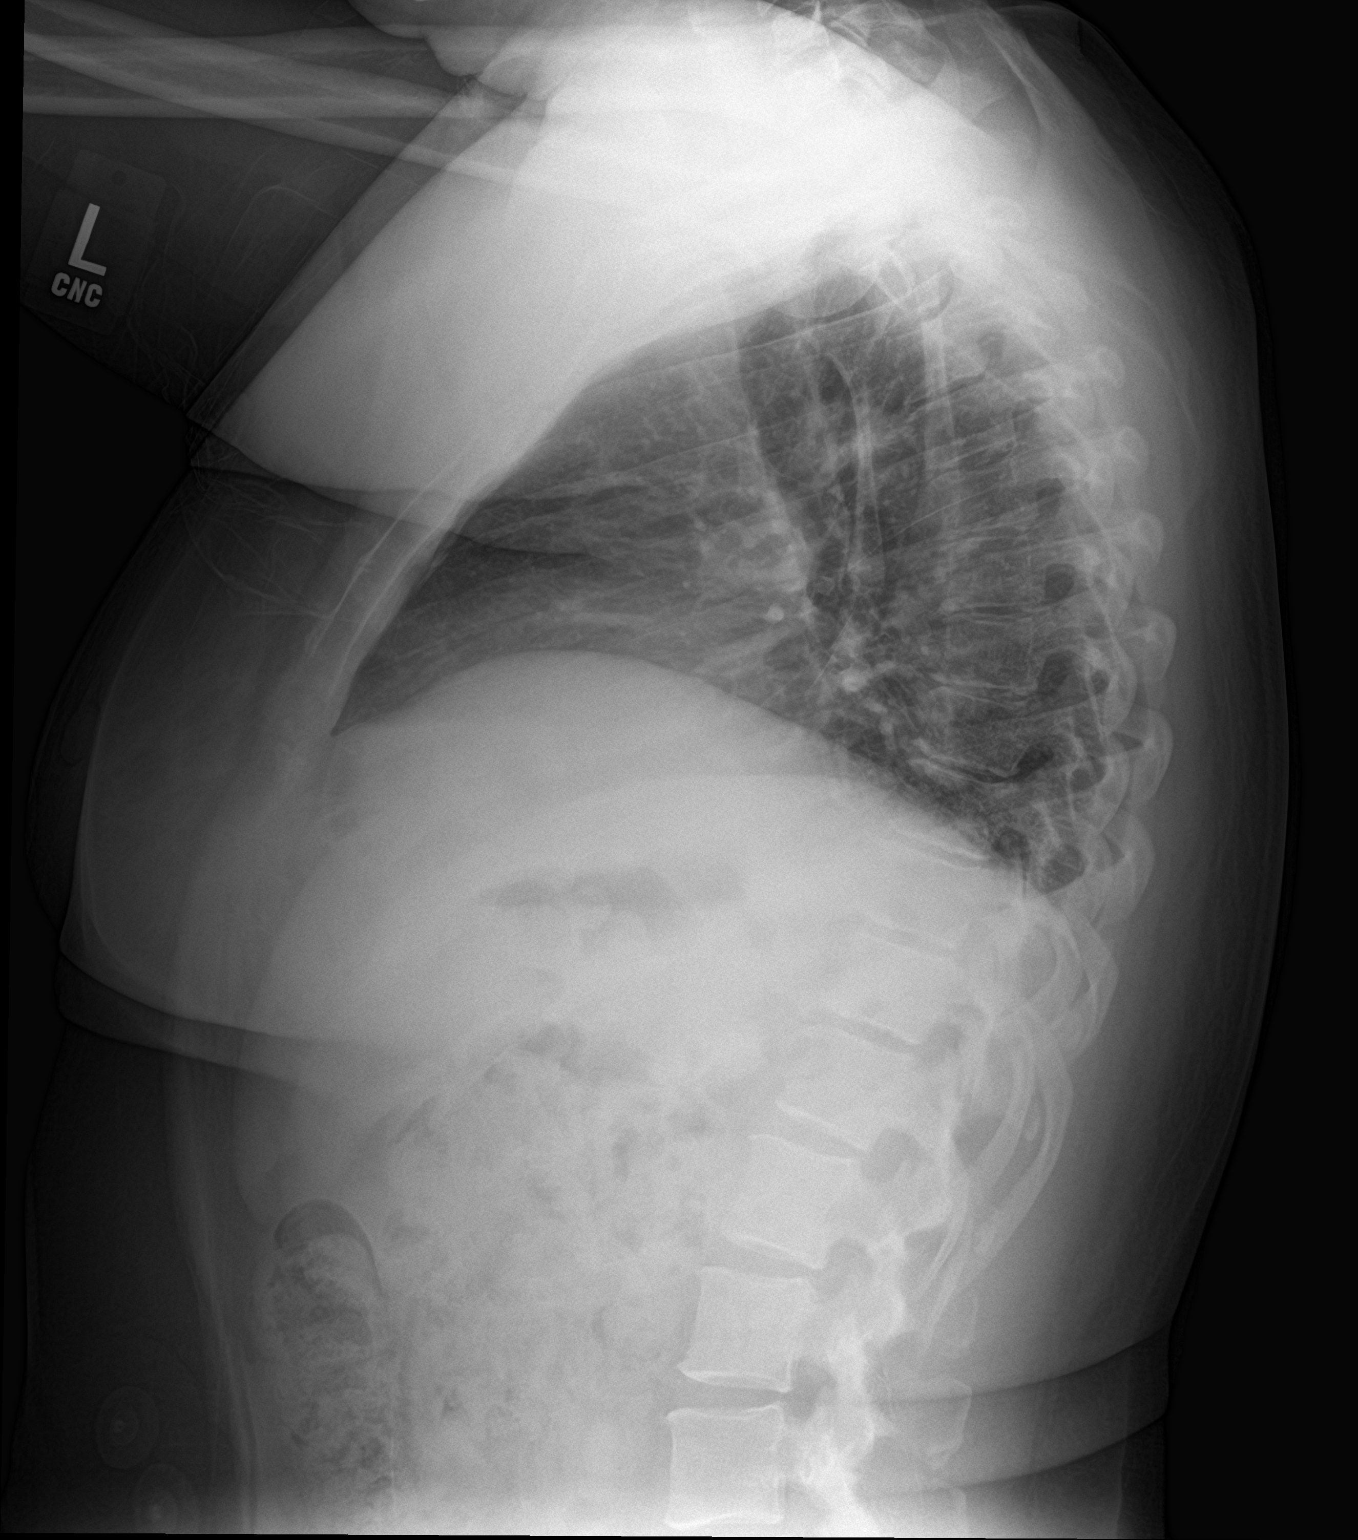

[2 of 2 positions shown; findings below may reference images not displayed]

FINDINGS: The heart size and mediastinal contours are within normal limits.
Low lung volumes are noted, however both lungs are clear. No
evidence of pleural effusion or pneumothorax. The visualized
skeletal structures are unremarkable.
IMPRESSION: Low inspiratory lung volumes.  No active cardiopulmonary disease.

## 2017-08-20 DIAGNOSIS — F209 Schizophrenia, unspecified: Secondary | ICD-10-CM | POA: Diagnosis not present

## 2017-08-20 DIAGNOSIS — F250 Schizoaffective disorder, bipolar type: Secondary | ICD-10-CM | POA: Diagnosis not present

## 2017-08-23 DIAGNOSIS — E782 Mixed hyperlipidemia: Secondary | ICD-10-CM | POA: Diagnosis not present

## 2017-08-23 DIAGNOSIS — I10 Essential (primary) hypertension: Secondary | ICD-10-CM | POA: Diagnosis not present

## 2017-09-03 DIAGNOSIS — F250 Schizoaffective disorder, bipolar type: Secondary | ICD-10-CM | POA: Diagnosis not present

## 2017-09-06 DIAGNOSIS — F250 Schizoaffective disorder, bipolar type: Secondary | ICD-10-CM | POA: Diagnosis not present

## 2017-09-06 DIAGNOSIS — F209 Schizophrenia, unspecified: Secondary | ICD-10-CM | POA: Diagnosis not present

## 2017-09-11 DIAGNOSIS — E559 Vitamin D deficiency, unspecified: Secondary | ICD-10-CM | POA: Diagnosis not present

## 2017-09-11 DIAGNOSIS — Z79899 Other long term (current) drug therapy: Secondary | ICD-10-CM | POA: Diagnosis not present

## 2017-09-11 DIAGNOSIS — E7800 Pure hypercholesterolemia, unspecified: Secondary | ICD-10-CM | POA: Diagnosis not present

## 2017-09-11 DIAGNOSIS — I10 Essential (primary) hypertension: Secondary | ICD-10-CM | POA: Diagnosis not present

## 2017-09-13 DIAGNOSIS — F250 Schizoaffective disorder, bipolar type: Secondary | ICD-10-CM | POA: Diagnosis not present

## 2017-09-13 DIAGNOSIS — F209 Schizophrenia, unspecified: Secondary | ICD-10-CM | POA: Diagnosis not present

## 2017-09-17 DIAGNOSIS — F209 Schizophrenia, unspecified: Secondary | ICD-10-CM | POA: Diagnosis not present

## 2017-09-17 DIAGNOSIS — F250 Schizoaffective disorder, bipolar type: Secondary | ICD-10-CM | POA: Diagnosis not present

## 2017-09-25 DIAGNOSIS — Z79899 Other long term (current) drug therapy: Secondary | ICD-10-CM | POA: Diagnosis not present

## 2017-09-27 DIAGNOSIS — F209 Schizophrenia, unspecified: Secondary | ICD-10-CM | POA: Diagnosis not present

## 2017-10-04 DIAGNOSIS — M2011 Hallux valgus (acquired), right foot: Secondary | ICD-10-CM | POA: Diagnosis not present

## 2017-10-04 DIAGNOSIS — M2012 Hallux valgus (acquired), left foot: Secondary | ICD-10-CM | POA: Diagnosis not present

## 2017-10-04 DIAGNOSIS — M79671 Pain in right foot: Secondary | ICD-10-CM | POA: Diagnosis not present

## 2017-10-04 DIAGNOSIS — M79672 Pain in left foot: Secondary | ICD-10-CM | POA: Diagnosis not present

## 2017-10-08 DIAGNOSIS — F250 Schizoaffective disorder, bipolar type: Secondary | ICD-10-CM | POA: Diagnosis not present

## 2017-10-15 DIAGNOSIS — F250 Schizoaffective disorder, bipolar type: Secondary | ICD-10-CM | POA: Diagnosis not present

## 2017-10-26 DIAGNOSIS — M79671 Pain in right foot: Secondary | ICD-10-CM | POA: Diagnosis not present

## 2017-10-26 DIAGNOSIS — M2011 Hallux valgus (acquired), right foot: Secondary | ICD-10-CM | POA: Diagnosis not present

## 2017-10-26 DIAGNOSIS — M2012 Hallux valgus (acquired), left foot: Secondary | ICD-10-CM | POA: Diagnosis not present

## 2017-10-26 DIAGNOSIS — M79672 Pain in left foot: Secondary | ICD-10-CM | POA: Diagnosis not present

## 2017-10-29 DIAGNOSIS — F250 Schizoaffective disorder, bipolar type: Secondary | ICD-10-CM | POA: Diagnosis not present

## 2017-11-01 DIAGNOSIS — F250 Schizoaffective disorder, bipolar type: Secondary | ICD-10-CM | POA: Diagnosis not present

## 2017-11-01 DIAGNOSIS — F209 Schizophrenia, unspecified: Secondary | ICD-10-CM | POA: Diagnosis not present

## 2018-07-12 ENCOUNTER — Other Ambulatory Visit: Payer: Self-pay | Admitting: Internal Medicine

## 2018-07-12 DIAGNOSIS — Z1231 Encounter for screening mammogram for malignant neoplasm of breast: Secondary | ICD-10-CM

## 2018-08-11 ENCOUNTER — Encounter (HOSPITAL_COMMUNITY): Payer: Self-pay

## 2018-08-11 ENCOUNTER — Other Ambulatory Visit: Payer: Self-pay

## 2018-08-11 ENCOUNTER — Emergency Department (HOSPITAL_COMMUNITY)
Admission: EM | Admit: 2018-08-11 | Discharge: 2018-08-11 | Disposition: A | Discharge status: Home / Self Care | Payer: Medicare Other | Attending: Emergency Medicine | Admitting: Emergency Medicine

## 2018-08-11 DIAGNOSIS — Z79899 Other long term (current) drug therapy: Secondary | ICD-10-CM | POA: Diagnosis not present

## 2018-08-11 DIAGNOSIS — F329 Major depressive disorder, single episode, unspecified: Secondary | ICD-10-CM | POA: Diagnosis present

## 2018-08-11 DIAGNOSIS — F341 Dysthymic disorder: Secondary | ICD-10-CM | POA: Diagnosis not present

## 2018-08-11 NOTE — Progress Notes (Signed)
Patient ID: Paula Dickson, female   DOB: 09/02/1966, 52 y.o.   MRN: [id number]  Pt was seen and chart reviewed with treatment team.  Pt denies suicidal/homicidal ideation, denies auditory/visual hallucinations and does not appear to be responding to internal stimuli. Pt is connected to [facility] and has medication for her depression. Pt will follow up with her therapist at [facility]. Pt is stable for discharge.   Ethelene Hal, NP-C 08/11/2018       [phone]

## 2018-08-11 NOTE — ED Notes (Signed)
Pt verbalized that she takes BP medication and will be doing so.

## 2018-08-11 NOTE — [facility] Counselor (Signed)
Paula Blossom, NP, patient does not meet inpatient criteria at this time. Patient is psych-cleared.

## 2018-08-11 NOTE — ED Provider Notes (Signed)
[facility] DEPT Provider Note   CSN: [id number] Arrival date & time: 08/11/18  1631     History   Chief Complaint Chief Complaint  Patient presents with  . Medical Clearance    HPI Paula Dickson is a 52 y.o. female.  52 yo F with a chief complaint of depression.  Patient feels it is gotten much worse now that she knows that her father is coming to live with her after being in the hospital.  She does not feel that she is able to take care of him appropriately.  She is worried that she will do something around.  She denies suicidal or homicidal ideation.  She denies hallucinations.  Denies cough congestion fever chills myalgias denies abdominal pain denies nausea vomiting diarrhea.  She has a history of depression.  She is on multiple medications for this.  She takes them as she feels like it.  The history is provided by the patient.  Illness  This is a new problem. The current episode started yesterday. The problem occurs constantly. The problem has not changed since onset.Pertinent negatives include no chest pain, no abdominal pain, no headaches and no shortness of breath. Nothing aggravates the symptoms. Nothing relieves the symptoms. She has tried nothing for the symptoms. The treatment provided no relief.    Past Medical History:  Diagnosis Date  . Bipolar affective disorder ([place])   . History of arthritis   . History of chicken pox   . History of depression   . History of genital warts   . history of heart murmur   . History of high blood pressure   . History of thyroid disease   . History of UTI   . Hypertension   . Low TSH level 07/13/2017  . Schizophrenia [facility])     Patient Active Problem List   Diagnosis Date Noted  . Toxic encephalopathy 07/14/2017  . Suicide attempt ([place]) 07/13/2017  . Low TSH level 07/13/2017  . Overdose of psychotropic 07/12/2017  . Valproic acid toxicity 07/12/2017  . Hyperammonemia ([place]) 07/12/2017  .  Schizophrenia ([place]) 07/12/2017  . Bipolar affective disorder ([place]) 07/12/2017  . Insomnia 12/27/2015  . Abnormal urinalysis 12/27/2015  . 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 Bipolar I disorder, most recent episode manic, severe with psychotic features ([place]) 06/14/2015    Past Surgical History:  Procedure Laterality Date  . ABLATION ON ENDOMETRIOSIS    . CYST REMOVAL NECK     around 11 years ago /benign  . MULTIPLE TOOTH EXTRACTIONS       OB History   None      Home Medications    Prior to Admission medications   Medication Sig Start Date End Date Taking? Authorizing Provider  amLODipine (NORVASC) 5 MG tablet TAKE 1 TABLET BY MOUTH EVERY DAY Patient taking differently: Take 5 mg by mouth daily.  02/14/16  Yes Lucille Passy, MD  benztropine (COGENTIN) 0.5 MG tablet Take 1 tablet (0.5 mg total) by mouth at bedtime. 03/30/17  Yes Patrecia Pour, NP  lithium carbonate 150 MG capsule Take 300 mg by mouth 3 (three) times daily with meals.   Yes [provider]  Multiple Vitamins-Minerals (MULTIVITAMIN ADULT) TABS Take 1 tablet by mouth at bedtime. Women's multivitamin   Yes [provider]  OLANZapine (ZYPREXA) 20 MG tablet Take 20 mg by mouth at bedtime.  06/13/17  Yes [provider]  propranolol (INDERAL) 20 MG tablet Take 1 tablet (20 mg total) by  mouth 3 (three) times daily. Patient taking differently: Take 10 mg by mouth 3 (three) times daily.  12/20/15  Yes Pucilowska, Jolanta B, MD  divalproex (DEPAKOTE SPRINKLE) 125 MG capsule Take 1 capsule (125 mg total) by mouth 2 (two) times daily. Patient not taking: Reported on 08/11/2018 07/17/17   Charlynne Cousins, MD  lithium carbonate (ESKALITH) 450 MG CR tablet Take 1 tablet (450 mg total) by mouth every 12 (twelve) hours. Patient not taking: Reported on 07/11/2017 03/30/17   Patrecia Pour, NP  QUEtiapine (SEROQUEL XR) 300 MG 24 hr tablet Take 2 tablets (600 mg total) by mouth at bedtime. Patient not  taking: Reported on 07/11/2017 03/30/17   Patrecia Pour, NP    Family History Family History  Problem Relation Age of Onset  . Arthritis Father   . Hyperlipidemia Father   . High blood pressure Father   . Diabetes Sister   . Diabetes Mother   . Diabetes Brother   . Mental illness Brother   . Alcohol abuse Paternal Uncle   . Alcohol abuse Paternal Grandfather   . Breast cancer Maternal Aunt   . Breast cancer Paternal Aunt   . High blood pressure Sister   . Mental illness Other        runs in family    Social History Social History   Tobacco Use  . Smoking status: Never Smoker  . Smokeless tobacco: Never Used  Substance Use Topics  . Alcohol use: No  . Drug use: No     Allergies   Patient has no known allergies.   Review of Systems Review of Systems  Constitutional: Negative for chills and fever.  HENT: Negative for congestion and rhinorrhea.   Eyes: Negative for redness and visual disturbance.  Respiratory: Negative for shortness of breath and wheezing.   Cardiovascular: Negative for chest pain and palpitations.  Gastrointestinal: Negative for abdominal pain, nausea and vomiting.  Genitourinary: Negative for dysuria and urgency.  Musculoskeletal: Negative for arthralgias and myalgias.  Skin: Negative for pallor and wound.  Neurological: Negative for dizziness and headaches.  Psychiatric/Behavioral: Positive for dysphoric mood.     Physical Exam Updated Vital Signs BP (!) 158/107 (BP Location: Right Arm)   Pulse 96   Temp 98.2 F (36.8 C) (Oral)   Resp (!) 22   SpO2 94%   Physical Exam  Constitutional: She is oriented to person, place, and time. She appears well-developed and well-nourished. No distress.  HENT:  Head: Normocephalic and atraumatic.  Eyes: Pupils are equal, round, and reactive to light. EOM are normal.  Neck: Normal range of motion. Neck supple.  Cardiovascular: Normal rate and regular rhythm. Exam reveals no gallop and no friction rub.   No murmur heard. Pulmonary/Chest: Effort normal. She has no wheezes. She has no rales.  Abdominal: Soft. She exhibits no distension. There is no tenderness.  Musculoskeletal: She exhibits no edema or tenderness.  Neurological: She is alert and oriented to person, place, and time.  Skin: Skin is warm and dry. She is not diaphoretic.  Psychiatric: Her behavior is normal. She exhibits a depressed mood (tearful on exam). She expresses no homicidal and no suicidal ideation. She expresses no suicidal plans and no homicidal plans.  Nursing note and vitals reviewed.    ED Treatments / Results  Labs (all labs ordered are listed, but only abnormal results are displayed) Labs Reviewed  COMPREHENSIVE METABOLIC PANEL  ETHANOL  SALICYLATE LEVEL  ACETAMINOPHEN LEVEL  CBC  RAPID URINE  DRUG SCREEN, HOSP PERFORMED  I-STAT BETA HCG BLOOD, ED ([facility], [facility], [facility] ONLY)    EKG None  Radiology No results found.  Procedures Procedures (including critical care time)  Medications Ordered in ED Medications - No data to display   Initial Impression / Assessment and Plan / ED Course  I have reviewed the triage vital signs and the nursing notes.  Pertinent labs & imaging results that were available during my care of the patient were reviewed by me and considered in my medical decision making (see chart for details).     52 yo F with a chief complaint of depression.  The patient is concerned that she is not going to be able to take care of her father who is being discharged from the hospital today.  She felt overwhelmed was crying uncontrollably and felt that she needed to be checked and evaluated for her mental health.  She has been taking her mental health medications as she needs to.  Has not taken them recently.  Denies any medical complaint.  She is medically clear for TTS evaluation.  TTS feels safe for outpatient care.    7:28 PM:  I have discussed the diagnosis/risks/treatment options with the  patient and believe the pt to be eligible for discharge home to follow-up with PCP. We also discussed returning to the ED immediately if new or worsening sx occur. We discussed the sx which are most concerning (e.g., sudden worsening pain, fever, inability to tolerate by mouth) that necessitate immediate return. Medications administered to the patient during their visit and any new prescriptions provided to the patient are listed below.  Medications given during this visit Medications - No data to display    The patient appears reasonably screen and/or stabilized for discharge and I doubt any other medical condition or other [facility] requiring further screening, evaluation, or treatment in the ED at this time prior to discharge.    Final Clinical Impressions(s) / ED Diagnoses   Final diagnoses:  Dysthymia    ED Discharge Orders    None       Deno Etienne, DO 08/11/18 1928

## 2018-08-11 NOTE — BH Assessment (Signed)
Tele Assessment Note   Patient Name: Paula Dickson MRN: [id number] Referring Physician:   Deno Etienne, DO Location of Patient:  [facility] Location of Provider: [place]  Paula Dickson is an 52 y.o. female present to [facility] unaccompanied with complaint of depressive symptoms. When asked what brings you to the hospital patient stated, "My sister-in-law said I need to work on me. I have not been myself lately, I been making wrong decision." When asked what wrong decision patient stated, "I been talking about my friends." Patient denies suicidal / homicidal ideations, denies auditory / visual hallucinations. Patient receives medication management and outpatient services with [facility] [place]. Patient has a history of inpatient hospitalization at a various of facilities in the path. Patient states she taking her medication as prescribed and is attending her therapy sessions as scheduled.   Patient presents nervous an anxious. Patient oriented x4. Patient report she feels she needs to talk to her therapist more about her feelings.    Diagnosis: F25.1  Schizoaffective disorder, Depressive type  Past Medical History:  Past Medical History:  Diagnosis Date  . Bipolar affective disorder ([place])   . History of arthritis   . History of chicken pox   . History of depression   . History of genital warts   . history of heart murmur   . History of high blood pressure   . History of thyroid disease   . History of UTI   . Hypertension   . Low TSH level 07/13/2017  . Schizophrenia [facility])     Past Surgical History:  Procedure Laterality Date  . ABLATION ON ENDOMETRIOSIS    . CYST REMOVAL NECK     around 11 years ago /benign  . MULTIPLE TOOTH EXTRACTIONS      Family History:  Family History  Problem Relation Age of Onset  . Arthritis Father   . Hyperlipidemia Father   . High blood pressure Father   . Diabetes Sister   . Diabetes Mother   . Diabetes Brother   .  Mental illness Brother   . Alcohol abuse Paternal Uncle   . Alcohol abuse Paternal Grandfather   . Breast cancer Maternal Aunt   . Breast cancer Paternal Aunt   . High blood pressure Sister   . Mental illness Other        runs in family    Social History:  reports that she has never smoked. She has never used smokeless tobacco. She reports that she does not drink alcohol or use drugs.  Additional Social History:  Alcohol / Drug Use Pain Medications: See MAR Prescriptions: See MAR Over the Counter: See MAR History of alcohol / drug use?: No history of alcohol / drug abuse Longest period of sobriety (when/how long): N/A  CIWA: CIWA-Ar BP: (!) 158/107 Pulse Rate: 96 COWS:    Allergies: No Known Allergies  Home Medications:  (Not in a hospital admission)  OB/GYN Status:  No LMP recorded. (Menstrual status: Perimenopausal).  General Assessment Data Location of Assessment: [facility] TTS Assessment: In system Is this a Tele or Face-to-Face Assessment?: Face-to-Face Is this an Initial Assessment or a Re-assessment for this encounter?: Initial Assessment Patient Accompanied by:: Other(brother and brother-in-law) Language Other than English: No Living Arrangements: Other (Comment)(report lives with father ) What gender do you identify as?: Female Marital status: Single Maiden name: n/a Pregnancy Status: No Living Arrangements: Parent(report lives with dad ) Can pt return to current living arrangement?: Yes Admission Status:  Voluntary Is patient capable of signing voluntary admission?: Yes Referral Source: Self/Family/Friend Insurance type: Medicare     Crisis Care Plan Living Arrangements: Parent(report lives with dad ) [facility] Psychiatrist[facility]  Name of Therapist: [facility]      Risk to self with the past 6 months Suicidal Ideation: No Has patient been a risk to self within the past 6 months prior to admission? : No Suicidal Intent: No Has patient  had any suicidal intent within the past 6 months prior to admission? : No Is patient at risk for suicide?: No Suicidal Plan?: No Has patient had any suicidal plan within the past 6 months prior to admission? : No Access to Means: No What has been your use of drugs/alcohol within the last 12 months?: patient denies  Previous Attempts/Gestures: No How many times?: 0 Other Self Harm Risks: none report  Triggers for Past Attempts: None known Intentional Self Injurious Behavior: None Family Suicide History: No Recent stressful life event(s): Other (Comment)(None report ) Persecutory voices/beliefs?: No Depression: Yes Depression Symptoms: Feeling worthless/self pity Substance abuse history and/or treatment for substance abuse?: No Suicide prevention information given to non-admitted patients: Not applicable  Risk to Others within the past 6 months Homicidal Ideation: No Does patient have any lifetime risk of violence toward others beyond the six months prior to admission? : No Thoughts of Harm to Others: No Current Homicidal Intent: No Current Homicidal Plan: No Access to Homicidal Means: No Identified Victim: n/a History of harm to others?: No Assessment of Violence: None Noted Violent Behavior Description: None Noted  Does patient have access to weapons?: No Criminal Charges Pending?: No Does patient have a court date: No Is patient on probation?: No  Psychosis Hallucinations: None noted Delusions: None noted  Mental Status Report Appearance/Hygiene: Other (Comment)(causal dressed and groomed ) Eye Contact: Good Motor Activity: Freedom of movement Speech: Logical/coherent Level of Consciousness: Alert Mood: Anxious, Other (Comment)(nervous ) Affect: Anxious Anxiety Level: None Thought Processes: Coherent, Relevant Judgement: Unimpaired Orientation: Person, Place, Time, Situation Obsessive Compulsive Thoughts/Behaviors: None  Cognitive Functioning Concentration:  Normal Memory: Recent Intact, Remote Intact Is patient IDD: No Insight: Fair Impulse Control: Fair Appetite: Good Have you had any weight changes? : No Change Sleep: No Change(pt sleep fine with sleeping medication ) Total Hours of Sleep: 9 Vegetative Symptoms: None  ADLScreening [facility] Assessment Services) Patient's cognitive ability adequate to safely complete daily activities?: Yes Patient able to express need for assistance with ADLs?: Yes Independently performs ADLs?: Yes (appropriate for developmental age)  Prior Inpatient Therapy Prior Inpatient Therapy: Yes Prior Therapy Dates: multiple dates  Prior Therapy Facilty/Provider(s): [place], [facility]  Reason for Treatment: Mental Health   Prior Outpatient Therapy Prior Outpatient Therapy: Yes Prior Therapy Dates: weekly  [facility]  Reason for Treatment: Mental Health  Does patient have an ACCT team?: No Does patient have Intensive In-House Services?  : No Does patient have [name] services? : No Does patient have P4CC services?: No  ADL Screening (condition at time of admission) Patient's cognitive ability adequate to safely complete daily activities?: Yes Is the patient deaf or have difficulty hearing?: No Does the patient have difficulty seeing, even when wearing glasses/contacts?: No Does the patient have difficulty concentrating, remembering, or making decisions?: No Patient able to express need for assistance with ADLs?: Yes Does the patient have difficulty dressing or bathing?: No Independently performs ADLs?: Yes (appropriate for developmental age) Does the patient have difficulty  walking or climbing stairs?: No       Abuse/Neglect Assessment (Assessment to be complete while patient is alone) Abuse/Neglect Assessment Can Be Completed: Yes Physical Abuse: Denies Verbal Abuse: Denies Sexual Abuse: Denies Exploitation of patient/patient's resources:  Denies Self-Neglect: Denies     Advance Directives (For Healthcare) Does Patient Have a Medical Advance Directive?: No Would patient like information on creating a medical advance directive?: No - Patient declined          Disposition:  Disposition Initial Assessment Completed for this Encounter: Beatriz Stallion, NP, pt does not meet inpt hospitalization )   Trell Secrist [facility] 08/11/2018 6:34 PM

## 2018-08-11 NOTE — ED Triage Notes (Signed)
Pt is requesting medical clearance for [facility] treatment. She reports increased depression and anxiety over the last few weeks. She denies SI/HI or drug use. States, "I've been making the wrong decisions with relationships." She appears anxious. A&Ox4. Ambulatory.

## 2018-08-11 NOTE — Discharge Instructions (Signed)
Please return to the ED for thinking you may hurt yourself or others

## 2018-08-11 NOTE — ED Notes (Signed)
Bed: [facility] Expected date:  Expected time:  Means of arrival:  Comments: 

## 2018-08-15 ENCOUNTER — Ambulatory Visit: Payer: Self-pay

## 2018-10-03 ENCOUNTER — Ambulatory Visit
Admission: RE | Admit: 2018-10-03 | Discharge: 2018-10-03 | Disposition: A | Discharge status: Home / Self Care | Payer: Medicare Other | Source: Ambulatory Visit | Attending: Internal Medicine | Admitting: Internal Medicine

## 2018-10-03 DIAGNOSIS — Z1231 Encounter for screening mammogram for malignant neoplasm of breast: Secondary | ICD-10-CM

## 2018-10-07 ENCOUNTER — Other Ambulatory Visit: Payer: Self-pay | Admitting: Internal Medicine

## 2018-10-07 DIAGNOSIS — R928 Other abnormal and inconclusive findings on diagnostic imaging of breast: Secondary | ICD-10-CM

## 2018-10-31 ENCOUNTER — Emergency Department (HOSPITAL_COMMUNITY)
Admission: EM | Admit: 2018-10-31 | Discharge: 2018-11-01 | Disposition: A | Discharge status: Other Acute Inpatient Hospital | Payer: Medicare Other | Attending: Emergency Medicine | Admitting: Emergency Medicine

## 2018-10-31 ENCOUNTER — Encounter (HOSPITAL_COMMUNITY): Payer: Self-pay | Admitting: Emergency Medicine

## 2018-10-31 DIAGNOSIS — F3130 Bipolar disorder, current episode depressed, mild or moderate severity, unspecified: Secondary | ICD-10-CM

## 2018-10-31 DIAGNOSIS — Z79899 Other long term (current) drug therapy: Secondary | ICD-10-CM | POA: Insufficient documentation

## 2018-10-31 DIAGNOSIS — F314 Bipolar disorder, current episode depressed, severe, without psychotic features: Secondary | ICD-10-CM

## 2018-10-31 DIAGNOSIS — F3181 Bipolar II disorder: Secondary | ICD-10-CM | POA: Insufficient documentation

## 2018-10-31 DIAGNOSIS — F209 Schizophrenia, unspecified: Secondary | ICD-10-CM | POA: Insufficient documentation

## 2018-10-31 DIAGNOSIS — F312 Bipolar disorder, current episode manic severe with psychotic features: Secondary | ICD-10-CM | POA: Diagnosis present

## 2018-10-31 LAB — RAPID URINE DRUG SCREEN, HOSP PERFORMED
Amphetamines: NOT DETECTED
Barbiturates: NOT DETECTED
Benzodiazepines: NOT DETECTED
Cocaine: NOT DETECTED
Opiates: NOT DETECTED
Tetrahydrocannabinol: NOT DETECTED

## 2018-10-31 LAB — CBC WITH DIFFERENTIAL/PLATELET
Abs Immature Granulocytes: 0.01 10*3/uL (ref 0.00–0.07)
Basophils Absolute: 0 10*3/uL (ref 0.0–0.1)
Basophils Relative: 0 %
Eosinophils Absolute: 0.1 10*3/uL (ref 0.0–0.5)
Eosinophils Relative: 2 %
HCT: 41.2 % (ref 36.0–46.0)
Hemoglobin: 13 g/dL (ref 12.0–15.0)
Immature Granulocytes: 0 %
Lymphocytes Relative: 45 %
Lymphs Abs: 2.2 10*3/uL (ref 0.7–4.0)
MCH: 28.8 pg (ref 26.0–34.0)
MCHC: 31.6 g/dL (ref 30.0–36.0)
MCV: 91.4 fL (ref 80.0–100.0)
Monocytes Absolute: 0.4 10*3/uL (ref 0.1–1.0)
Monocytes Relative: 8 %
Neutro Abs: 2.2 10*3/uL (ref 1.7–7.7)
Neutrophils Relative %: 45 %
Platelets: 171 10*3/uL (ref 150–400)
RBC: 4.51 MIL/uL (ref 3.87–5.11)
RDW: 12.3 % (ref 11.5–15.5)
WBC: 4.9 10*3/uL (ref 4.0–10.5)
nRBC: 0 % (ref 0.0–0.2)

## 2018-10-31 LAB — COMPREHENSIVE METABOLIC PANEL
ALT: 18 U/L (ref 0–44)
AST: 14 U/L — ABNORMAL LOW (ref 15–41)
Albumin: 4.4 g/dL (ref 3.5–5.0)
Alkaline Phosphatase: 60 U/L (ref 38–126)
Anion gap: 11 (ref 5–15)
BUN: 12 mg/dL (ref 6–20)
CO2: 27 mmol/L (ref 22–32)
Calcium: 10.1 mg/dL (ref 8.9–10.3)
Chloride: 109 mmol/L (ref 98–111)
Creatinine, Ser: 1.02 mg/dL — ABNORMAL HIGH (ref 0.44–1.00)
GFR calc Af Amer: >60 mL/min (ref >60)
GFR calc non Af Amer: >60 mL/min (ref >60)
Glucose, Bld: 105 mg/dL — ABNORMAL HIGH (ref 70–99)
Potassium: 3.1 mmol/L — ABNORMAL LOW (ref 3.5–5.1)
Sodium: 147 mmol/L — ABNORMAL HIGH (ref 135–145)
Total Bilirubin: 0.7 mg/dL (ref 0.3–1.2)
Total Protein: 6.9 g/dL (ref 6.5–8.1)

## 2018-10-31 LAB — ETHANOL: Alcohol, Ethyl (B): <10 mg/dL (ref <10)

## 2018-10-31 LAB — VALPROIC ACID LEVEL: Valproic Acid Lvl: 102 ug/mL — ABNORMAL HIGH (ref 50.0–100.0)

## 2018-10-31 LAB — ACETAMINOPHEN LEVEL: Acetaminophen (Tylenol), Serum: <10 ug/mL — ABNORMAL LOW (ref 10–30)

## 2018-10-31 LAB — SALICYLATE LEVEL: Salicylate Lvl: <7 mg/dL (ref 2.8–30.0)

## 2018-10-31 MED ORDER — AMLODIPINE BESYLATE 5 MG PO TABS
5.0000 mg | ORAL_TABLET | Freq: Every day | ORAL | Status: DC
  Administered 2018-11-01: 5 mg via ORAL
  Filled 2018-10-31: qty 1

## 2018-10-31 MED ORDER — BENZTROPINE MESYLATE 0.5 MG PO TABS
0.5000 mg | ORAL_TABLET | Freq: Every day | ORAL | Status: DC
  Administered 2018-10-31: 0.5 mg via ORAL
  Filled 2018-10-31: qty 1

## 2018-10-31 MED ORDER — POTASSIUM CHLORIDE CRYS ER 20 MEQ PO TBCR
20.0000 meq | EXTENDED_RELEASE_TABLET | Freq: Every day | ORAL | Status: DC
  Administered 2018-11-01: 20 meq via ORAL
  Filled 2018-10-31: qty 1

## 2018-10-31 MED ORDER — PROPRANOLOL HCL ER 60 MG PO CP24
60.0000 mg | ORAL_CAPSULE | Freq: Every day | ORAL | Status: DC
  Administered 2018-11-01: 60 mg via ORAL
  Filled 2018-10-31 (×2): qty 1

## 2018-10-31 MED ORDER — POTASSIUM CHLORIDE CRYS ER 20 MEQ PO TBCR
40.0000 meq | EXTENDED_RELEASE_TABLET | Freq: Once | ORAL | Status: AC
  Administered 2018-10-31: 40 meq via ORAL
  Filled 2018-10-31: qty 2

## 2018-10-31 MED ORDER — OLANZAPINE 10 MG PO TABS
20.0000 mg | ORAL_TABLET | Freq: Every day | ORAL | Status: DC
  Administered 2018-10-31: 20 mg via ORAL
  Filled 2018-10-31: qty 2

## 2018-10-31 NOTE — ED Triage Notes (Signed)
Patient here from home reporting that she saw her therapist today and they recommended she come here for evaluation. Reports that she has come off of lithium and has started Depakote. Denies SI, HI, Hallucinations. "I really don't know why I'm here, just doing what they told me to do".

## 2018-10-31 NOTE — ED Notes (Signed)
Unable to draw blACood x1 attempt lt [facility]

## 2018-10-31 NOTE — BH Assessment (Signed)
Assessment Note  Paula Dickson is an 52 y.o. female. Pt denies SI/HI and AVH. Pt denies previous SI. The Pt was encouraged to come to the ED by her psychiatrist at [facility]. The Pt was a poor historian. The Pt has difficulty completing the assessment due to thought blocking. The Pt was able to answer questions but her answers were delayed. When the client begins to attempt to answer questions the client's lips will move and she will appear to try to talk but words do not come out. The client stated that she was having a difficult time expressing herself. The client reports previous hospitalizations. The Pt denies SA.  A voicemail message was left for the Pt's sister Research officer, trade [employer].  Margarita Grizzle, NP recommends am psych evaluation to monitor any changes in the Pt's labs.  Diagnosis:  F31.81 Bipolar  Past Medical History:  Past Medical History:  Diagnosis Date  . Bipolar affective disorder ([place])   . History of arthritis   . History of chicken pox   . History of depression   . History of genital warts   . history of heart murmur   . History of high blood pressure   . History of thyroid disease   . History of UTI   . Hypertension   . Low TSH level 07/13/2017  . Schizophrenia [facility])     Past Surgical History:  Procedure Laterality Date  . ABLATION ON ENDOMETRIOSIS    . CYST REMOVAL NECK     around 11 years ago /benign  . MULTIPLE TOOTH EXTRACTIONS      Family History:  Family History  Problem Relation Age of Onset  . Arthritis Father   . Hyperlipidemia Father   . High blood pressure Father   . Diabetes Sister   . Diabetes Mother   . Diabetes Brother   . Mental illness Brother   . Alcohol abuse Paternal Uncle   . Alcohol abuse Paternal Grandfather   . Breast cancer Maternal Aunt   . Breast cancer Paternal Aunt   . High blood pressure Sister   . Mental illness Other        runs in family    Social History:  reports that she has never smoked. She has never  used smokeless tobacco. She reports that she does not drink alcohol or use drugs.  Additional Social History:  Alcohol / Drug Use Pain Medications: please see mar Prescriptions: please see mar Over the Counter: please see mar History of alcohol / drug use?: No history of alcohol / drug abuse Longest period of sobriety (when/how long): NA  CIWA: CIWA-Ar BP: (!) 147/93 Pulse Rate: 70 COWS:    Allergies: No Known Allergies  Home Medications:  (Not in a hospital admission)  OB/GYN Status:  No LMP recorded. (Menstrual status: Perimenopausal).  General Assessment Data Location of Assessment: [facility] TTS Assessment: In system Is this a Tele or Face-to-Face Assessment?: Face-to-Face Is this an Initial Assessment or a Re-assessment for this encounter?: Initial Assessment Patient Accompanied by:: Other Language Other than English: No Living Arrangements: Other (Comment) What gender do you identify as?: Female Marital status: Single Maiden name: NA Pregnancy Status: No Living Arrangements: Parent Can pt return to current living arrangement?: Yes Admission Status: Voluntary Is patient capable of signing voluntary admission?: Yes Referral Source: Self/Family/Friend Insurance type: Medicare     Crisis Care Plan Living Arrangements: Parent Legal Guardian: Other:(self) Name of Psychiatrist: [facility] Name of Therapist: [facility]     Risk to self with the past 6 months Suicidal Ideation: No Has patient been a risk to self within the past 6 months prior to admission? : No Suicidal Intent: No Has patient had any suicidal intent within the past 6 months prior to admission? : No Is patient at risk for suicide?: No Suicidal Plan?: No Has patient had any suicidal plan within the past 6 months prior to admission? : No Access to Means: No What has been your use of drugs/alcohol within the last 12 months?: NA Previous Attempts/Gestures:  No How many times?: 0 Other Self Harm Risks: NA Triggers for Past Attempts: None known Intentional Self Injurious Behavior: None Family Suicide History: No Recent stressful life event(s): Other (Comment)(none) Persecutory voices/beliefs?: No Depression: No Depression Symptoms: (Pt denies) Substance abuse history and/or treatment for substance abuse?: No Suicide prevention information given to non-admitted patients: Not applicable  Risk to Others within the past 6 months Homicidal Ideation: No Does patient have any lifetime risk of violence toward others beyond the six months prior to admission? : No Thoughts of Harm to Others: No Current Homicidal Intent: No Current Homicidal Plan: No Access to Homicidal Means: No Identified Victim: NA History of harm to others?: No Assessment of Violence: None Noted Violent Behavior Description: NA Does patient have access to weapons?: No Criminal Charges Pending?: No Does patient have a court date: No Is patient on probation?: No  Psychosis Hallucinations: None noted Delusions: None noted  Mental Status Report Appearance/Hygiene: Unremarkable Eye Contact: Fair Motor Activity: Freedom of movement Speech: Pressured, Soft, Slow Level of Consciousness: Alert Mood: Anxious Affect: Anxious Anxiety Level: Moderate Thought Processes: Relevant Judgement: Impaired Orientation: Person, Place Obsessive Compulsive Thoughts/Behaviors: None  Cognitive Functioning Concentration: Decreased Memory: Remote Intact Is patient IDD: No Insight: Poor Impulse Control: Poor Appetite: Fair Have you had any weight changes? : No Change Sleep: No Change Total Hours of Sleep: 8 Vegetative Symptoms: None  ADLScreening [facility]) Patient's cognitive ability adequate to safely complete daily activities?: Yes Patient able to express need for assistance with ADLs?: Yes Independently performs ADLs?: Yes (appropriate for developmental  age)  Prior Inpatient Therapy Prior Inpatient Therapy: Yes Prior Therapy Dates: 2019 Prior Therapy Facilty/Provider(s): [facility] Reason for Treatment: Bipolar  Prior Outpatient Therapy Prior Outpatient Therapy: Yes Prior Therapy Dates: current  Prior [facility]ilty[facility]Provider([facility] Reason for Treatment: Bipolar Does patient have an ACCT team?: No Does patient have Intensive In-House Services?  : No Does patient have [name] services? : No Does patient have P4CC services?: No  ADL Screening (condition at time of admission) Patient's cognitive ability adequate to safely complete daily activities?: Yes Is the patient deaf or have difficulty hearing?: No Does the patient have difficulty seeing, even when wearing glasses/contacts?: No Does the patient have difficulty concentrating, remembering, or making decisions?: No Patient able to express need for assistance with ADLs?: Yes Does the patient have difficulty dressing or bathing?: No Independently performs ADLs?: Yes (appropriate for developmental age) Does the patient have difficulty walking or climbing stairs?: No       Abuse/Neglect Assessment (Assessment to be complete while patient is alone) Abuse/Neglect Assessment Can Be Completed: Yes Physical Abuse: Denies Verbal Abuse: Denies Sexual Abuse: Denies Exploitation of patient/patient's resources: Denies     Advance Directives (For Healthcare) Does Patient Have a Medical Advance Directive?: No Would patient like information on creating a medical advance directive?: No - Patient declined  Disposition:  Disposition Initial Assessment Completed for this Encounter: Yes  On Site Evaluation by:   Reviewed with Physician:    Cyndia Bent 10/31/2018 6:08 PM

## 2018-10-31 NOTE — ED Notes (Signed)
Pt alert and mildly confused. Pt frightened by other patients. Pt attempts to answers questions. Pt try to answer questions but is unable to communicate effectively. Pt denies any si, hi, or avh. Pt resting in bed. Pt safe, will continue to monitor.

## 2018-10-31 NOTE — ED Provider Notes (Signed)
[facility] DEPT Provider Note   CSN: [id number] Arrival date & time: 10/31/18  1236   History   Chief Complaint Chief Complaint  Patient presents with  . Psychiatric Evaluation    HPI Paula Dickson is a 52 y.o. female with past medical history significant for schizophrenia, bipolar disorder, hypertension, hyperthyroidism, psychogenic polydipsia who presents to the emergency department with her sister for psychiatric evaluation.  Sister and patient state that patient was seen by her psychiatrist and psychologist and was for to the emergency department for evaluation.  Sister states that patient has been taking her medicines as prescribed.  Patient states that they recently changed her lithium to valproic acid over the last week.  Sister states that when patient was seen by her psychiatric providers that patient refused to talk with them.  Patient states she has been extremely concerned about her father.  Family denies denies erratic behavior.  Denies SI, HI, AVH.  Patient's only complaint is that her mouth is dry.  Upon asking patient questions she looks at her sister for answers.  Patient does have to be redirected to answer questions.  She does answer questions they are appropriate.  Denies fever, chills, nausea, vomiting, chest pain, shortness of breath, abdominal pain, dysuria. Admits to prior inpatient psychiatric hospitalizations.  History provided by patient and sister.  No interpreter was used.  HPI  Past Medical History:  Diagnosis Date  . Bipolar affective disorder ([place])   . History of arthritis   . History of chicken pox   . History of depression   . History of genital warts   . history of heart murmur   . History of high blood pressure   . History of thyroid disease   . History of UTI   . Hypertension   . Low TSH level 07/13/2017  . Schizophrenia [facility])     Patient Active Problem List   Diagnosis Date Noted  . Toxic encephalopathy  07/14/2017  . Suicide attempt ([place]) 07/13/2017  . Low TSH level 07/13/2017  . Overdose of psychotropic 07/12/2017  . Valproic acid toxicity 07/12/2017  . Hyperammonemia ([place]) 07/12/2017  . Schizophrenia ([place]) 07/12/2017  . Bipolar affective disorder ([place]) 07/12/2017  . Insomnia 12/27/2015  . Abnormal urinalysis 12/27/2015  . 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 Bipolar I disorder, most recent episode manic, severe with psychotic features ([place]) 06/14/2015    Past Surgical History:  Procedure Laterality Date  . ABLATION ON ENDOMETRIOSIS    . CYST REMOVAL NECK     around 11 years ago /benign  . MULTIPLE TOOTH EXTRACTIONS       OB History   None      Home Medications    Prior to Admission medications   Medication Sig Start Date End Date Taking? Authorizing Provider  ABILIFY MAINTENA 400 MG SRER injection Inject 400 mg into the muscle every 30 (thirty) days. 10/02/18  Yes [provider]  amLODipine (NORVASC) 5 MG tablet TAKE 1 TABLET BY MOUTH EVERY DAY Patient taking differently: Take 5 mg by mouth daily.  02/14/16  Yes Lucille Passy, MD  benztropine (COGENTIN) 0.5 MG tablet Take 1 tablet (0.5 mg total) by mouth at bedtime. 03/30/17  Yes Patrecia Pour, NP  chlorhexidine (PERIDEX) 0.12 % solution 15 mLs by Mouth Rinse route 2 (two) times daily as needed (mouth pain). Rinse and spit 08/29/18  Yes [provider]  divalproex (DEPAKOTE ER) 500 MG 24 hr tablet Take 1,000 mg by mouth at  bedtime. 10/21/18  Yes [provider]  Multiple Vitamins-Minerals (MULTIVITAMIN ADULT) TABS Take 1 tablet by mouth daily.    Yes [provider]  OLANZapine (ZYPREXA) 20 MG tablet Take 20 mg by mouth at bedtime.  06/13/17  Yes [provider]  PREVIDENT 5000 BOOSTER PLUS 1.1 % PSTE Take 1 application by mouth 2 (two) times daily. 07/30/18  Yes [provider]  propranolol ER (INDERAL LA) 60 MG 24 hr capsule Take 60 mg by mouth daily. 09/09/18  Yes  [provider]  divalproex (DEPAKOTE SPRINKLE) 125 MG capsule Take 1 capsule (125 mg total) by mouth 2 (two) times daily. Patient not taking: Reported on 08/11/2018 07/17/17   Charlynne Cousins, MD  lithium carbonate (ESKALITH) 450 MG CR tablet Take 1 tablet (450 mg total) by mouth every 12 (twelve) hours. Patient not taking: Reported on 07/11/2017 03/30/17   Patrecia Pour, NP  propranolol (INDERAL) 20 MG tablet Take 1 tablet (20 mg total) by mouth 3 (three) times daily. Patient not taking: Reported on 10/31/2018 12/20/15   Pucilowska, Herma Ard B, MD  QUEtiapine (SEROQUEL XR) 300 MG 24 hr tablet Take 2 tablets (600 mg total) by mouth at bedtime. Patient not taking: Reported on 07/11/2017 03/30/17   Patrecia Pour, NP    Family History Family History  Problem Relation Age of Onset  . Arthritis Father   . Hyperlipidemia Father   . High blood pressure Father   . Diabetes Sister   . Diabetes Mother   . Diabetes Brother   . Mental illness Brother   . Alcohol abuse Paternal Uncle   . Alcohol abuse Paternal Grandfather   . Breast cancer Maternal Aunt   . Breast cancer Paternal Aunt   . High blood pressure Sister   . Mental illness Other        runs in family    Social History Social History   Tobacco Use  . Smoking status: Never Smoker  . Smokeless tobacco: Never Used  Substance Use Topics  . Alcohol use: No  . Drug use: No     Allergies   Patient has no known allergies.   Review of Systems Review of Systems  Constitutional: Negative.   HENT: Negative.   Respiratory: Negative.   Cardiovascular: Negative.   Gastrointestinal: Negative.   Genitourinary: Negative.   Musculoskeletal: Negative.   Skin: Negative.   Neurological: Negative.   All other systems reviewed and are negative.    Physical Exam Updated Vital Signs BP (!) 153/101 (BP Location: Left Arm)   Pulse 89   Temp 98 F (36.7 C) (Oral)   Resp 19   SpO2 99%   Physical Exam  Constitutional: She  appears well-developed.  Non-toxic appearance. She does not have a sickly appearance. She does not appear ill. No distress.  HENT:  Head: Atraumatic.  Mouth/Throat: Uvula is midline and oropharynx is clear and moist.  Eyes: Pupils are equal, round, and reactive to light.  Neck: Normal range of motion.  Cardiovascular: Normal rate, regular rhythm, intact distal pulses and normal pulses. Exam reveals no gallop and no friction rub.  No murmur heard. Pulmonary/Chest: Effort normal and breath sounds normal. No accessory muscle usage or stridor. No tachypnea. No respiratory distress. She has no decreased breath sounds. She has no wheezes. She has no rhonchi. She has no rales.  Abdominal: Soft. Normal appearance and bowel sounds are normal. She exhibits no distension and no mass. There is no tenderness. There is no  rigidity, no rebound and no guarding.  Musculoskeletal: Normal range of motion.  Mild bilateral hand tremors.  Neurological: She is alert.  Skin: Skin is warm and dry. She is not diaphoretic.  Psychiatric: She has a normal mood and affect. Her behavior is normal.  Behavior is normal.  Patient denies SI, HI, AVH.  Patient appears depressed and anxious on exam.  She is able to answer questions appropriately, however questions need to be repeated for her to answer them.  Her speech is normal and not rapid or pressured.  Nursing note and vitals reviewed.    ED Treatments / Results  Labs (all labs ordered are listed, but only abnormal results are displayed) Labs Reviewed  COMPREHENSIVE METABOLIC PANEL  ETHANOL  RAPID URINE DRUG SCREEN, HOSP PERFORMED  CBC WITH DIFFERENTIAL/PLATELET  SALICYLATE LEVEL  ACETAMINOPHEN LEVEL  VALPROIC ACID LEVEL    EKG None  Radiology No results found.  Procedures Procedures (including critical care time)  Medications Ordered in ED Medications - No data to display   Initial Impression / Assessment and Plan / ED Course  I have reviewed the  triage vital signs and the nursing notes.  Pertinent labs & imaging results that were available during my care of the patient were reviewed by me and considered in my medical decision making (see chart for details).  52 year old female who appears otherwise well presents to the ED with sister from psychiatry office.  Patient with history of schizophrenia, bipolar, psychogenic polydipsia. Sister states patient has been compliant with her medicines, however was refusing to speak with psychiatry.  Sister states there was concern for worsening depression. They recommended follow-up with psychiatry at ED for reevaluation.  Upon initial evaluation patient sitting in room with sister.  Patient appears tearful on exam and in no acute distress.  She does have mild hypertension, however has history of hypertension denies headache, vision changes, chest pain or shortness of breath.  Patient is able to answer questions, however looks at sister for answers.  Denies SI, HI, AVH.  Patients sister states patient has been compliant with her psychiatric medicines. Patient was recently switched from lithium to Depakote.  Mild bilateral hand tremors, sister states this is chronic at baseline.   Will obtain labs and urine. Patient will need TTS consult when she is medically cleared.  Care transferred to [facility], PA-C who will determine ultimate treatment, plan and disposition of patient.    Final Clinical Impressions(s) / ED Diagnoses   Final diagnoses:  None    ED Discharge Orders    None       Kamala Kolton A, PA-C 10/31/18 1540    [name], MD 10/31/18 1742

## 2018-10-31 NOTE — ED Notes (Signed)
Pt to room #40. Pt pleasant on approach, thought blocking noted, speech slow. Pt denies SI/HI/AVH. Encouragement and support provided. Special checks q 15 mins in place for safety. Video monitoring in place. Will continue to monitor.

## 2018-11-01 ENCOUNTER — Other Ambulatory Visit: Payer: Self-pay

## 2018-11-01 ENCOUNTER — Encounter (HOSPITAL_COMMUNITY): Payer: Self-pay

## 2018-11-01 ENCOUNTER — Inpatient Hospital Stay (HOSPITAL_COMMUNITY)
Admission: AD | Admit: 2018-11-01 | Discharge: 2018-11-18 | DRG: 885 | Disposition: A | Discharge status: Home / Self Care | Payer: Medicare Other | Source: Intra-hospital | Attending: Psychiatry | Admitting: Psychiatry

## 2018-11-01 DIAGNOSIS — Z8261 Family history of arthritis: Secondary | ICD-10-CM | POA: Diagnosis not present

## 2018-11-01 DIAGNOSIS — F314 Bipolar disorder, current episode depressed, severe, without psychotic features: Secondary | ICD-10-CM | POA: Diagnosis not present

## 2018-11-01 DIAGNOSIS — I10 Essential (primary) hypertension: Secondary | ICD-10-CM | POA: Diagnosis present

## 2018-11-01 DIAGNOSIS — F419 Anxiety disorder, unspecified: Secondary | ICD-10-CM | POA: Diagnosis present

## 2018-11-01 DIAGNOSIS — G4700 Insomnia, unspecified: Secondary | ICD-10-CM | POA: Diagnosis present

## 2018-11-01 DIAGNOSIS — R251 Tremor, unspecified: Secondary | ICD-10-CM | POA: Diagnosis present

## 2018-11-01 DIAGNOSIS — F3160 Bipolar disorder, current episode mixed, unspecified: Secondary | ICD-10-CM | POA: Diagnosis present

## 2018-11-01 DIAGNOSIS — R4189 Other symptoms and signs involving cognitive functions and awareness: Secondary | ICD-10-CM | POA: Diagnosis present

## 2018-11-01 DIAGNOSIS — F3130 Bipolar disorder, current episode depressed, mild or moderate severity, unspecified: Secondary | ICD-10-CM | POA: Diagnosis present

## 2018-11-01 DIAGNOSIS — F061 Catatonic disorder due to known physiological condition: Secondary | ICD-10-CM | POA: Diagnosis present

## 2018-11-01 DIAGNOSIS — Z818 Family history of other mental and behavioral disorders: Secondary | ICD-10-CM | POA: Diagnosis not present

## 2018-11-01 DIAGNOSIS — Z79899 Other long term (current) drug therapy: Secondary | ICD-10-CM

## 2018-11-01 DIAGNOSIS — E039 Hypothyroidism, unspecified: Secondary | ICD-10-CM | POA: Diagnosis not present

## 2018-11-01 DIAGNOSIS — E0590 Thyrotoxicosis, unspecified without thyrotoxic crisis or storm: Secondary | ICD-10-CM | POA: Diagnosis present

## 2018-11-01 DIAGNOSIS — Z8349 Family history of other endocrine, nutritional and metabolic diseases: Secondary | ICD-10-CM

## 2018-11-01 DIAGNOSIS — Z803 Family history of malignant neoplasm of breast: Secondary | ICD-10-CM | POA: Diagnosis not present

## 2018-11-01 DIAGNOSIS — R634 Abnormal weight loss: Secondary | ICD-10-CM | POA: Diagnosis present

## 2018-11-01 DIAGNOSIS — F250 Schizoaffective disorder, bipolar type: Secondary | ICD-10-CM | POA: Diagnosis present

## 2018-11-01 DIAGNOSIS — Z833 Family history of diabetes mellitus: Secondary | ICD-10-CM

## 2018-11-01 LAB — BASIC METABOLIC PANEL
Anion gap: 9 (ref 5–15)
BUN: 13 mg/dL (ref 6–20)
CO2: 29 mmol/L (ref 22–32)
Calcium: 10 mg/dL (ref 8.9–10.3)
Chloride: 110 mmol/L (ref 98–111)
Creatinine, Ser: 0.94 mg/dL (ref 0.44–1.00)
GFR calc Af Amer: >60 mL/min (ref >60)
GFR calc non Af Amer: >60 mL/min (ref >60)
Glucose, Bld: 97 mg/dL (ref 70–99)
Potassium: 3.8 mmol/L (ref 3.5–5.1)
Sodium: 148 mmol/L — ABNORMAL HIGH (ref 135–145)

## 2018-11-01 LAB — VALPROIC ACID LEVEL: Valproic Acid Lvl: 48 ug/mL — ABNORMAL LOW (ref 50.0–100.0)

## 2018-11-01 MED ORDER — HYDROXYZINE HCL 25 MG PO TABS
25.0000 mg | ORAL_TABLET | Freq: Three times a day (TID) | ORAL | Status: DC | PRN
  Administered 2018-11-02 – 2018-11-10 (×7): 25 mg via ORAL
  Filled 2018-11-01 (×8): qty 1

## 2018-11-01 MED ORDER — AMLODIPINE BESYLATE 5 MG PO TABS
5.0000 mg | ORAL_TABLET | Freq: Every day | ORAL | Status: DC
  Administered 2018-11-02 – 2018-11-17 (×16): 5 mg via ORAL
  Filled 2018-11-01 (×20): qty 1

## 2018-11-01 MED ORDER — OLANZAPINE 10 MG PO TABS
20.0000 mg | ORAL_TABLET | Freq: Every day | ORAL | Status: DC
  Administered 2018-11-01 – 2018-11-03 (×3): 20 mg via ORAL
  Filled 2018-11-01 (×7): qty 2

## 2018-11-01 MED ORDER — ACETAMINOPHEN 325 MG PO TABS: 650.0000 mg | ORAL_TABLET | Freq: Four times a day (QID) | ORAL | Status: DC | PRN

## 2018-11-01 MED ORDER — PROPRANOLOL HCL ER 60 MG PO CP24
60.0000 mg | ORAL_CAPSULE | Freq: Every day | ORAL | Status: DC
  Administered 2018-11-02 – 2018-11-07 (×6): 60 mg via ORAL
  Filled 2018-11-01 (×8): qty 1

## 2018-11-01 MED ORDER — ALUM & MAG HYDROXIDE-SIMETH 200-200-20 MG/5ML PO SUSP: 30.0000 mL | ORAL | Status: DC | PRN

## 2018-11-01 MED ORDER — BENZTROPINE MESYLATE 0.5 MG PO TABS
0.5000 mg | ORAL_TABLET | Freq: Every day | ORAL | Status: DC
  Administered 2018-11-01: 0.5 mg via ORAL
  Filled 2018-11-01 (×4): qty 1

## 2018-11-01 MED ORDER — TRAZODONE HCL 50 MG PO TABS
50.0000 mg | ORAL_TABLET | Freq: Every evening | ORAL | Status: DC | PRN
  Administered 2018-11-02 – 2018-11-03 (×2): 50 mg via ORAL
  Filled 2018-11-01 (×3): qty 1

## 2018-11-01 MED ORDER — POTASSIUM CHLORIDE CRYS ER 20 MEQ PO TBCR
20.0000 meq | EXTENDED_RELEASE_TABLET | Freq: Every day | ORAL | Status: DC
  Administered 2018-11-02: 20 meq via ORAL
  Filled 2018-11-01 (×3): qty 1

## 2018-11-01 MED ORDER — MAGNESIUM HYDROXIDE 400 MG/5ML PO SUSP: 30.0000 mL | Freq: Every day | ORAL | Status: DC | PRN

## 2018-11-01 NOTE — Progress Notes (Signed)
Patient ID: Paula Dickson, female   DOB: [birth year], 52 y.o.   MRN: [id number] PER STATE REGULATIONS 482.30  THIS CHART WAS REVIEWED FOR MEDICAL NECESSITY WITH RESPECT TO THE PATIENT'S ADMISSION/DURATION OF STAY.  NEXT REVIEW DATE:11/05/18 Roma Schanz, RN, BSN CASE MANAGER

## 2018-11-01 NOTE — ED Notes (Signed)
Phlebotomist called for ordered lab drawl.

## 2018-11-01 NOTE — ED Notes (Signed)
Pt taking a shower 

## 2018-11-01 NOTE — ED Notes (Signed)
Lab notified of bmp needed for pt.

## 2018-11-01 NOTE — BH Assessment (Signed)
[facility] Assessment Progress Note  Per Buford Dresser, DO, this pt requires psychiatric hospitalization.  Letitia Libra, RN, [facility] has assigned pt to [facility] Rm [id number].  Dr Mariea Clonts also finds that pt meets criteria for IVC, which she has initiated.  IVC documents have been faxed to [facility], and at [employer] confirms receipt.  She has since faxed Findings and Custody Order to this Probation officer.  At 13:21 I called [employer] and spoke to [employer], who took demographic information, agreeing to dispatch law enforcement to fill out Return of Service.  As of this writing, arrival of law enforcement is pending.  Petition and First Examination have been faxed to [facility].  Pt's nurse, Caryl Pina, has been notified, and agrees to call report to [phone].  Pt is to be transported via Event organiser.   Jalene Mullet, [place] Coordinator [phone]

## 2018-11-01 NOTE — ED Notes (Signed)
[employer] on unit to transfer pt to [facility] Adult unit per MD order. Personal property given to [employer] for transfer. Pt ambulatory off unit in police custody.

## 2018-11-01 NOTE — Consult Note (Addendum)
[place] [facility] Consult   Reason for Consult:  Bizarre and psychotic behavior Referring Physician:  EDP Patient Identification: Paula Dickson MRN:  [id number] Principal Diagnosis: Bipolar disorder, most recent episode depressed ([place]) Diagnosis:  Principal Problem:   Bipolar I disorder, most recent episode manic, severe with psychotic features ([place])   Total Time spent with patient: 30 minutes  HPI:   Mayari Matus is a 52 y.o. female patient admitted with bizarre behavior. Pt is guarded and presents with thought blocking. She is unable to answer most questions and takes a long time to answer some questions. She was sent here from [place] yesterday. Assessment note does not state why her medications were changed from Lithium to Depakote (unsure of the date) and her valproic acid level on admission is 102, her Potassium was 3.1. Her Depakote was not given while in the emergency room. A repeat valproic acid level has been ordered today. Will leave it up to the discretion of providers at [facility] when to restart Depakote. It is unclear how or how much medication she is taking because she stated she is in charge of her own medications and takes them throughout the day. Pt also stated she is getting her Abilify Maintena injection monthly but was unable to tell when the last injection was given or where she receives these injections. Pt did state she lives with her father and has home health for him. She stated she thinks she needs help at home for herself. Pt is in need of inpatient psychiatric hospitalization for crisis stabilization and medication management.   Attempted to gather collateral from Pt's sister Kensie Susman @ [phone], no answer, HIPAA compliant voice mail left requesting call back.    Past Psychiatric History: As above  Risk to Self: Suicidal Ideation: No Suicidal Intent: No Is patient at risk for suicide?: No Suicidal Plan?: No Access to Means:  No What has been your use of drugs/alcohol within the last 12 months?: NA How many times?: 0 Other Self Harm Risks: NA Triggers for Past Attempts: None known Intentional Self Injurious Behavior: None Risk to Others: Homicidal Ideation: No Thoughts of Harm to Others: No Current Homicidal Intent: No Current Homicidal Plan: No Access to Homicidal Means: No Identified Victim: NA History of harm to others?: No Assessment of Violence: None Noted Violent Behavior Description: NA Does patient have access to weapons?: No Criminal Charges Pending?: No Does patient have a court date: No Prior Inpatient Therapy: Prior Inpatient Therapy: Yes Prior Therapy Dates: 2019 Prior Therapy Facilty/Provider(s): [facility] Reason for Treatment: Bipolar Prior Outpatient Therapy: Prior Outpatient Therapy: Yes Prior Therapy Dates: current  Prior Therapy Facilty/Provider(s): [facility] Reason for Treatment: Bipolar Does patient have an ACCT team?: No Does patient have Intensive In-House Services?  : No Does patient have [name] services? : No Does patient have P4CC services?: No  Past Medical History:  Past Medical History:  Diagnosis Date  . Bipolar affective disorder ([place])   . History of arthritis   . History of chicken pox   . History of depression   . History of genital warts   . history of heart murmur   . History of high blood pressure   . History of thyroid disease   . History of UTI   . Hypertension   . Low TSH level 07/13/2017  . Schizophrenia [facility])     Past Surgical History:  Procedure Laterality Date  . ABLATION ON ENDOMETRIOSIS    .  CYST REMOVAL NECK     around 11 years ago /benign  . MULTIPLE TOOTH EXTRACTIONS     Family History:  Family History  Problem Relation Age of Onset  . Arthritis Father   . Hyperlipidemia Father   . High blood pressure Father   . Diabetes Sister   . Diabetes Mother   . Diabetes Brother   . Mental illness Brother   .  Alcohol abuse Paternal Uncle   . Alcohol abuse Paternal Grandfather   . Breast cancer Maternal Aunt   . Breast cancer Paternal Aunt   . High blood pressure Sister   . Mental illness Other        runs in family   Family Psychiatric  History: As listed above.  Social History:  Social History   Substance and Sexual Activity  Alcohol Use No     Social History   Substance and Sexual Activity  Drug Use No    Social History   Socioeconomic History  . Marital status: Single    Spouse name: Not on file  . Number of children: Not on file  . Years of education: Not on file  . Highest education level: Not on file  Occupational History  . Not on file  Social Needs  . Financial resource strain: Not on file  . Food insecurity:    Worry: Not on file    Inability: Not on file  . Transportation needs:    Medical: Not on file    Non-medical: Not on file  Tobacco Use  . Smoking status: Never Smoker  . Smokeless tobacco: Never Used  Substance and Sexual Activity  . Alcohol use: No  . Drug use: No  . Sexual activity: Not Currently  Lifestyle  . Physical activity:    Days per week: Not on file    Minutes per session: Not on file  . Stress: Not on file  Relationships  . Social connections:    Talks on phone: Not on file    Gets together: Not on file    Attends religious service: Not on file    Active member of club or organization: Not on file    Attends meetings of clubs or organizations: Not on file    Relationship status: Not on file  Other Topics Concern  . Not on file  Social History Narrative  . Not on file   Additional Social History: N/A    Allergies:  No Known Allergies  Labs:  Results for orders placed or performed during the hospital encounter of 10/31/18 (from the past 48 hour(s))  Urine rapid drug screen (hosp performed)     Status: None   Collection Time: 10/31/18  3:31 PM  Result Value Ref Range   Opiates NONE DETECTED NONE DETECTED   Cocaine NONE  DETECTED NONE DETECTED   Benzodiazepines NONE DETECTED NONE DETECTED   Amphetamines NONE DETECTED NONE DETECTED   Tetrahydrocannabinol NONE DETECTED NONE DETECTED   Barbiturates NONE DETECTED NONE DETECTED    Comment: (NOTE) DRUG SCREEN FOR MEDICAL PURPOSES ONLY.  IF CONFIRMATION IS NEEDED FOR ANY PURPOSE, NOTIFY LAB WITHIN 5 DAYS. LOWEST DETECTABLE LIMITS FOR URINE DRUG SCREEN Drug Class                     Cutoff (ng/mL) Amphetamine and metabolites    1000 Barbiturate and metabolites    200 Benzodiazepine  789 Tricyclics and metabolites     300 Opiates and metabolites        300 Cocaine and metabolites        300 THC                            50 Performed at [facility], [place] [address] [place], [place] [address]   Comprehensive metabolic panel     Status: Abnormal   Collection Time: 10/31/18  3:40 PM  Result Value Ref Range   Sodium 147 (H) 135 - 145 mmol/L   Potassium 3.1 (L) 3.5 - 5.1 mmol/L   Chloride 109 98 - 111 mmol/L   CO2 27 22 - 32 mmol/L   Glucose, Bld 105 (H) 70 - 99 mg/dL   BUN 12 6 - 20 mg/dL   Creatinine, Ser 1.02 (H) 0.44 - 1.00 mg/dL   Calcium 10.1 8.9 - 10.3 mg/dL   Total Protein 6.9 6.5 - 8.1 g/dL   Albumin 4.4 3.5 - 5.0 g/dL   AST 14 (L) 15 - 41 U/L   ALT 18 0 - 44 U/L   Alkaline Phosphatase 60 38 - 126 U/L   Total Bilirubin 0.7 0.3 - 1.2 mg/dL   GFR calc non Af Amer >60 >60 mL/min   GFR calc Af Amer >60 >60 mL/min   Anion gap 11 5 - 15    Comment: Performed at [facility], [place] [address], [place], [place] [address]  Ethanol     Status: None   Collection Time: 10/31/18  3:40 PM  Result Value Ref Range   Alcohol, Ethyl (B) <10 <10 mg/dL    Comment: (NOTE) Lowest detectable limit for serum alcohol is 10 mg/dL. For medical purposes only. Performed at [facility], [place] [address] [place], [place] [address]   CBC with Diff     Status: None   Collection Time: 10/31/18   3:40 PM  Result Value Ref Range   WBC 4.9 4.0 - 10.5 K/uL   RBC 4.51 3.87 - 5.11 MIL/uL   Hemoglobin 13.0 12.0 - 15.0 g/dL   HCT 41.2 36.0 - 46.0 %   MCV 91.4 80.0 - 100.0 fL   MCH 28.8 26.0 - 34.0 pg   MCHC 31.6 30.0 - 36.0 g/dL   RDW 12.3 11.5 - 15.5 %   Platelets 171 150 - 400 K/uL   nRBC 0.0 0.0 - 0.2 %   Neutrophils Relative % 45 %   Neutro Abs 2.2 1.7 - 7.7 K/uL   Lymphocytes Relative 45 %   Lymphs Abs 2.2 0.7 - 4.0 K/uL   Monocytes Relative 8 %   Monocytes Absolute 0.4 0.1 - 1.0 K/uL   Eosinophils Relative 2 %   Eosinophils Absolute 0.1 0.0 - 0.5 K/uL   Basophils Relative 0 %   Basophils Absolute 0.0 0.0 - 0.1 K/uL   Immature Granulocytes 0 %   Abs Immature Granulocytes 0.01 0.00 - 0.07 K/uL    Comment: Performed at [facility], [place] [address], [place], [place] [address]  Salicylate level     Status: None   Collection Time: 10/31/18  3:40 PM  Result Value Ref Range   Salicylate Lvl <3.5 2.8 - 30.0 mg/dL    Comment: Performed at [facility], [place] [address] [place], [place] [address]  Acetaminophen level     Status: Abnormal   Collection Time: 10/31/18  3:40 PM  Result Value Ref Range   Acetaminophen (Tylenol), Serum <10 (L) 10 - 30 ug/mL    Comment: (NOTE) Therapeutic concentrations vary significantly. A range of 10-30 ug/mL  may be an effective concentration for many patients. However, some  are best treated at concentrations outside of this range. Acetaminophen concentrations >150 ug/mL at 4 hours after ingestion  and >50 ug/mL at 12 hours after ingestion are often associated with  toxic reactions. Performed at [facility], [place] [address] [place], [place] [address]   Valproic acid level     Status: Abnormal   Collection Time: 10/31/18  3:40 PM  Result Value Ref Range   Valproic Acid Lvl 102 (H) 50.0 - 100.0 ug/mL    Comment: Performed at [facility], [place] [address],  [place], [place] [address]  Basic metabolic panel     Status: Abnormal   Collection Time: 11/01/18  6:57 AM  Result Value Ref Range   Sodium 148 (H) 135 - 145 mmol/L   Potassium 3.8 3.5 - 5.1 mmol/L    Comment: DELTA CHECK NOTED REPEATED TO VERIFY NO VISIBLE HEMOLYSIS    Chloride 110 98 - 111 mmol/L   CO2 29 22 - 32 mmol/L   Glucose, Bld 97 70 - 99 mg/dL   BUN 13 6 - 20 mg/dL   Creatinine, Ser 0.94 0.44 - 1.00 mg/dL   Calcium 10.0 8.9 - 10.3 mg/dL   GFR calc non Af Amer >60 >60 mL/min   GFR calc Af Amer >60 >60 mL/min   Anion gap 9 5 - 15    Comment: Performed at [facility], [place] [address] [place], [place] [address]    Current Facility-Administered Medications  Medication Dose Route Frequency Provider Last Rate Last Dose  . amLODipine (NORVASC) tablet 5 mg  5 mg Oral Daily Charlesetta Shanks, MD   5 mg at 11/01/18 0934  . benztropine (COGENTIN) tablet 0.5 mg  0.5 mg Oral QHS Charlesetta Shanks, MD   0.5 mg at 10/31/18 2109  . OLANZapine (ZYPREXA) tablet 20 mg  20 mg Oral QHS Charlesetta Shanks, MD   20 mg at 10/31/18 2109  . potassium chloride SA (K-DUR,KLOR-CON) CR tablet 20 mEq  20 mEq Oral Daily Charlesetta Shanks, MD   20 mEq at 11/01/18 0934  . propranolol ER (INDERAL LA) 24 hr capsule 60 mg  60 mg Oral Daily Charlesetta Shanks, MD   60 mg at 11/01/18 0321   Current Outpatient Medications  Medication Sig Dispense Refill  . ABILIFY MAINTENA 400 MG SRER injection Inject 400 mg into the muscle every 30 (thirty) days.  2  . amLODipine (NORVASC) 5 MG tablet TAKE 1 TABLET BY MOUTH EVERY DAY (Patient taking differently: Take 5 mg by mouth daily. ) 30 tablet 6  . benztropine (COGENTIN) 0.5 MG tablet Take 1 tablet (0.5 mg total) by mouth at bedtime. 30 tablet 0  . chlorhexidine (PERIDEX) 0.12 % solution 15 mLs by Mouth Rinse route 2 (two) times daily as needed (mouth pain). Rinse and spit  3  . divalproex (DEPAKOTE ER) 500 MG 24 hr tablet Take 1,000 mg by mouth at bedtime.  1  .  Multiple Vitamins-Minerals (MULTIVITAMIN ADULT) TABS Take 1 tablet by mouth daily.     Marland Kitchen OLANZapine (ZYPREXA) 20 MG tablet Take 20 mg by mouth at bedtime.   2  . PREVIDENT 5000 BOOSTER PLUS 1.1 % PSTE Take 1 application by mouth 2 (two) times daily.  2  . propranolol ER (INDERAL LA) 60 MG 24 hr capsule Take 60 mg by mouth daily.  2  . divalproex (DEPAKOTE SPRINKLE) 125 MG capsule Take 1 capsule (125 mg total) by mouth 2 (two) times daily. (Patient not taking: Reported on 08/11/2018)    . lithium carbonate (ESKALITH) 450 MG CR tablet Take 1 tablet (450 mg total) by mouth every 12 (twelve) hours. (Patient not taking: Reported on 07/11/2017) 60 tablet 0  . propranolol (INDERAL) 20 MG tablet Take 1 tablet (20 mg total) by mouth 3 (three) times daily. (Patient not taking: Reported on 10/31/2018) 90 tablet 0  . QUEtiapine (SEROQUEL XR) 300 MG 24 hr tablet Take 2 tablets (600 mg total) by mouth at bedtime. (Patient not taking: Reported on 07/11/2017) 60 tablet 0    Musculoskeletal: Strength & Muscle Tone: within normal limits Gait & Station: normal Patient leans: N/A  Psychiatric Specialty Exam: Physical Exam  Nursing note and vitals reviewed. Constitutional: She is oriented to person, place, and time. She appears well-developed and well-nourished.  HENT:  Head: Normocephalic and atraumatic.  Neck: Normal range of motion.  Respiratory: Effort normal.  Musculoskeletal: Normal range of motion.  Neurological: She is alert and oriented to person, place, and time.  Psychiatric: Judgment and thought content normal. Her mood appears anxious. Her speech is delayed and tangential. She is slowed. Cognition and memory are impaired.    Review of Systems  Psychiatric/Behavioral: Negative for hallucinations, substance abuse and suicidal ideas.  All other systems reviewed and are negative.   Blood pressure 131/75, pulse 81, temperature 98.2 F (36.8 C), temperature source Oral, resp. rate 16, SpO2 100 %.There  is no height or weight on file to calculate BMI.  General Appearance: Casual  Eye Contact:  Good  Speech:  Blocked, Clear and Coherent and Slow  Volume:  Normal  Mood:  Anxious and Depressed  Affect:  Congruent  Thought Process:  Descriptions of Associations: Intact  Orientation:  Full (Time, Place, and Person)  Thought Content:  Logical  Suicidal Thoughts:  No  Homicidal Thoughts:  No  Memory:  Immediate;   Fair Recent;   Fair Remote;   Fair  Judgement:  Impaired  Insight:  Present  Psychomotor Activity:  Normal, Tremor and bilateral hand tremors, per sister and chart review this is normal  Concentration:  Concentration: Fair and Attention Span: Fair  Recall:  [employer] of Knowledge:  Fair  Language:  thought blocking  Akathisia:  No  Handed:  Right  AIMS (if indicated):   N/A  Assets:  Museum/gallery curator Social Support Transportation  ADL's:  Intact  Cognition:  WNL  Sleep:   N/A     Treatment Plan Summary: Daily contact with patient to assess and evaluate symptoms and progress in treatment and Medication management  Crisis stabilization Medication management: Medications given in the [facility] -Cogentin 0.5 mg daily at bedtime for EPS -Zyprexa 20 mg daily at bedtime for mood stabilization -Potassium 20 meq daily for low potassium   Disposition: Recommend psychiatric Inpatient admission when medically cleared.  Ethelene Hal, NP 11/01/2018 1:33 PM   Patient seen face-to-face for psychiatric evaluation, chart reviewed and case discussed with the physician extender and developed treatment plan. Reviewed the information documented and agree with the treatment plan.  Buford Dresser, DO 11/03/18 9:07 PM

## 2018-11-02 DIAGNOSIS — F314 Bipolar disorder, current episode depressed, severe, without psychotic features: Secondary | ICD-10-CM

## 2018-11-02 MED ORDER — POTASSIUM CHLORIDE CRYS ER 20 MEQ PO TBCR
20.0000 meq | EXTENDED_RELEASE_TABLET | Freq: Every day | ORAL | Status: AC
  Administered 2018-11-03 – 2018-11-04 (×2): 20 meq via ORAL
  Filled 2018-11-02 (×3): qty 1

## 2018-11-02 NOTE — [facility] Group Notes (Signed)
LCSW Group Therapy Note  11/02/2018     11:15AM-12:00PM  Type of Therapy and Topic:  Group Therapy:  Self Sabotage  Participation Level:  Did Not Attend        . Description of Group:  Today's process group focused on the topic of Self Sabotage, what this is, and what methods of self-sabotage patients in the group have found themselves using.  Commonalities were then pointed out and the group explored possible benefits of choosing healthier coping skills.  Patients were asked to rate both their commitment to change and their confidence in their ability to change from 1 (lowest) to 10 (highest), then asked about their answers in order to provoke change talk.   Therapeutic Goals 1. Patient will be able to identify their typical methods of self sabotage. 2. Patient will list reasons they engage in these destructive behaviors, and harm that comes from them 3. Patient will be able verbalize the costs and benefits of drinking/drugging versus making the choice to change 4. Patient will rate their commitment to change and confidence about their ability to change, and will be guided to change talk.  Summary of Patient Progress:  N/A  Therapeutic Modalities Stages of Change Motivational Interviewing  Selmer Dominion, LCSW 11/02/2018, 8:30 AM

## 2018-11-02 NOTE — Progress Notes (Addendum)
Admission paperwork and assessment cannot be completed at this time. Pt is unable to cooperate. Pt presents with thought blocking; moderate confusion. Poor historian. Increase paranoia.

## 2018-11-02 NOTE — Progress Notes (Addendum)
Pt was observed resting in bed with eyes open. Pt appears paranoid/flat/anxious/confused/tearful in affect and mood. UTA SI/HI/AVH at this time. Pt denies pain. Writer spent significant amount of time encouraging Pt to take scheduled medications. Pt repeatedly asking name and dosage of Zyprexa and cogentin. Fluids and snacks offered; Pt accepted fluids. HFR protocol initiated due to confusion. Support offered. Will continue with POC.

## 2018-11-02 NOTE — Progress Notes (Signed)
D. Pt presents with an anxious affect congruent with mood- Pt is quiet, guarded and appears to be thought blocking at times - Pt has been calm and cooperative with no behavioral issues throughout shift. Pt observed in the milieu with little interactions with peers. Per pt's self inventory, pt rates her hopelessness and anxiety a 7/7, respectively. Pt  currently denies SI/HI and AVH  A. Labs and vitals monitored. Pt compliant with medications. Pt supported emotionally and encouraged to express concerns and ask questions.   R. Pt remains safe with 15 minute checks. Will continue POC.

## 2018-11-02 NOTE — H&P (Addendum)
Psychiatric Admission Assessment Adult  Patient Identification: Paula Dickson  MRN:  [id number]  Date of Evaluation:  11/02/2018  Chief Complaint:  SCHIZOPHRENIA  Principal Diagnosis: Schizoaffective disorder, bipolar type ([place])  Diagnosis:   Patient Active Problem List   Diagnosis Date Noted  . Schizoaffective disorder, bipolar type ([place]) [F25.0] 07/12/2017    Priority: High  . Bipolar affective disorder, current episode mixed ([place]) [F31.60] 11/01/2018  . Toxic encephalopathy [G92] 07/14/2017  . Suicide attempt ([place]) [T14.91XA] 07/13/2017  . Low TSH level [R79.89] 07/13/2017  . Overdose of psychotropic [T43.91XA] 07/12/2017  . Valproic acid toxicity [T42.6X1A] 07/12/2017  . Hyperammonemia ([place]) [E72.20] 07/12/2017  . Schizophrenia ([place]) [F20.9] 07/12/2017  . Insomnia [G47.00] 12/27/2015  . Abnormal urinalysis [R82.90] 12/27/2015  . 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 Bipolar I disorder, most recent episode manic, severe with psychotic features ([place]) [F31.2] 06/14/2015   History of Present Illness: (Per Md's admission SRA): 52 year old single female, no children,  lives with sister. Patient presents as poor historian at this time, has difficulty answering questions or elaborating on pertinent information. Some thought blocking noted. Patient was brought to ED by sister at the recommendation of outpatient provider, but details are limited and patient does not provide significant information. She does report history of mental illness and states she has been diagnosed with Schizoaffective Disorder in the past . Chart notes indicate she had recently been switched from Lithium to Depakote, but it is not clear if this change coincided with increased symptoms or psychiatric deterioration. She describes some depression, and states energy level is low, but denies changes in sleep, appetite or anhedonia. She also denies any suicidal ideations today or recently. She denies  hallucinations, but presents with soft, limited speech and at times appears to briefly thought block, and possibly to be internally preoccupied based on how she looks around the room. She does present vaguely fearful, paranoid, and requesting to keep doors open during session. Chart notes indicate a prior psychiatric admission in January of 2017 for manic symptoms, psychosis, disordered thought process . At the time was diagnosed with Bipolar Disorder, Manic, and was discharged on Li, Seroquel, Seroquel.  Most recent meds are Depakote 1000 mgrs QHS ( appears to be new medication) , Abilify Maintena 400 mgrs IM q month ( patient unsure when she last received it), Zyprexa 20 mgrs QHS although patient reports she was not taking it. Medical History- reports history of HTN Of note, patient has visible hand resting tremors- she states this is not new and " comes and goe"  Total Time spent with patient: 1 hour  Past Psychiatric History: Bipolar disorder. Rsk to Self: Yes Risk to Others: Yes Prior Inpatient Therapy: Yes Prior Outpatient Therapy: Yes  Alcohol Screening: NA  Substance Abuse History in the last 12 months:  No.  Consequences of Substance Abuse: NA  Previous Psychotropic Medications: Yes   Psychological Evaluations: No   ast Medical History:  Past Medical History:  Diagnosis Date  . Bipolar affective disorder ([place])   . History of arthritis   . History of chicken pox   . History of depression   . History of genital warts   . history of heart murmur   . History of high blood pressure   . History of thyroid disease   . History of UTI   . Hypertension   . Low TSH level 07/13/2017  . Schizophrenia [facility])     Past Surgical History:  Procedure Laterality Date  .  ABLATION ON ENDOMETRIOSIS    . CYST REMOVAL NECK     around 11 years ago /benign  . MULTIPLE TOOTH EXTRACTIONS     Family History:  Family History  Problem Relation Age of Onset  . Arthritis Father   .  Hyperlipidemia Father   . High blood pressure Father   . Diabetes Sister   . Diabetes Mother   . Diabetes Brother   . Mental illness Brother   . Alcohol abuse Paternal Uncle   . Alcohol abuse Paternal Grandfather   . Breast cancer Maternal Aunt   . Breast cancer Paternal Aunt   . High blood pressure Sister   . Mental illness Other        runs in family   Family Psychiatric  History: Brother with mental illness.  Social History   Substance and Sexual Activity  Alcohol Use No     Social History   Substance and Sexual Activity  Drug Use No    Social History   Socioeconomic History  . Marital status: Single    Spouse name: Not on file  . Number of children: Not on file  . Years of education: Not on file  . Highest education level: Not on file  Occupational History  . Not on file  Social Needs  . Financial resource strain: Not on file  . Food insecurity:    Worry: Not on file    Inability: Not on file  . Transportation needs:    Medical: Not on file    Non-medical: Not on file  Tobacco Use  . Smoking status: Never Smoker  . Smokeless tobacco: Never Used  Substance and Sexual Activity  . Alcohol use: No  . Drug use: No  . Sexual activity: Not Currently  Lifestyle  . Physical activity:    Days per week: Not on file    Minutes per session: Not on file  . Stress: Not on file  Relationships  . Social connections:    Talks on phone: Not on file    Gets together: Not on file    Attends religious service: Not on file    Active member of club or organization: Not on file    Attends meetings of clubs or organizations: Not on file    Relationship status: Not on file  Other Topics Concern  . Not on file  Social History Narrative  . Not on file   Additional Social History:   Allergies:  No Known Allergies  Lab Results:  Results for orders placed or performed during the hospital encounter of 10/31/18 (from the past 48 hour(s))  Urine rapid drug screen (hosp  performed)     Status: None   Collection Time: 10/31/18  3:31 PM  Result Value Ref Range   Opiates NONE DETECTED NONE DETECTED   Cocaine NONE DETECTED NONE DETECTED   Benzodiazepines NONE DETECTED NONE DETECTED   Amphetamines NONE DETECTED NONE DETECTED   Tetrahydrocannabinol NONE DETECTED NONE DETECTED   Barbiturates NONE DETECTED NONE DETECTED    Comment: (NOTE) DRUG SCREEN FOR MEDICAL PURPOSES ONLY.  IF CONFIRMATION IS NEEDED FOR ANY PURPOSE, NOTIFY LAB WITHIN 5 DAYS. LOWEST DETECTABLE LIMITS FOR URINE DRUG SCREEN Drug Class                     Cutoff (ng/mL) Amphetamine and metabolites    1000 Barbiturate and metabolites    200 Benzodiazepine  086 Tricyclics and metabolites     300 Opiates and metabolites        300 Cocaine and metabolites        300 THC                            50 Performed at [facility], [place] [address], [place], [place] [address]   Comprehensive metabolic panel     Status: Abnormal   Collection Time: 10/31/18  3:40 PM  Result Value Ref Range   Sodium 147 (H) 135 - 145 mmol/L   Potassium 3.1 (L) 3.5 - 5.1 mmol/L   Chloride 109 98 - 111 mmol/L   CO2 27 22 - 32 mmol/L   Glucose, Bld 105 (H) 70 - 99 mg/dL   BUN 12 6 - 20 mg/dL   Creatinine, Ser 1.02 (H) 0.44 - 1.00 mg/dL   Calcium 10.1 8.9 - 10.3 mg/dL   Total Protein 6.9 6.5 - 8.1 g/dL   Albumin 4.4 3.5 - 5.0 g/dL   AST 14 (L) 15 - 41 U/L   ALT 18 0 - 44 U/L   Alkaline Phosphatase 60 38 - 126 U/L   Total Bilirubin 0.7 0.3 - 1.2 mg/dL   GFR calc non Af Amer >60 >60 mL/min   GFR calc Af Amer >60 >60 mL/min   Anion gap 11 5 - 15    Comment: Performed at [facility], [place] [address] [place], [place] [address]  Ethanol     Status: None   Collection Time: 10/31/18  3:40 PM  Result Value Ref Range   Alcohol, Ethyl (B) <10 <10 mg/dL    Comment: (NOTE) Lowest detectable limit for serum alcohol is 10 mg/dL. For medical purposes only. Performed  at [facility], [place] [address] [place], [place] [address]   CBC with Diff     Status: None   Collection Time: 10/31/18  3:40 PM  Result Value Ref Range   WBC 4.9 4.0 - 10.5 K/uL   RBC 4.51 3.87 - 5.11 MIL/uL   Hemoglobin 13.0 12.0 - 15.0 g/dL   HCT 41.2 36.0 - 46.0 %   MCV 91.4 80.0 - 100.0 fL   MCH 28.8 26.0 - 34.0 pg   MCHC 31.6 30.0 - 36.0 g/dL   RDW 12.3 11.5 - 15.5 %   Platelets 171 150 - 400 K/uL   nRBC 0.0 0.0 - 0.2 %   Neutrophils Relative % 45 %   Neutro Abs 2.2 1.7 - 7.7 K/uL   Lymphocytes Relative 45 %   Lymphs Abs 2.2 0.7 - 4.0 K/uL   Monocytes Relative 8 %   Monocytes Absolute 0.4 0.1 - 1.0 K/uL   Eosinophils Relative 2 %   Eosinophils Absolute 0.1 0.0 - 0.5 K/uL   Basophils Relative 0 %   Basophils Absolute 0.0 0.0 - 0.1 K/uL   Immature Granulocytes 0 %   Abs Immature Granulocytes 0.01 0.00 - 0.07 K/uL    Comment: Performed at [facility], [place] [address], [place], [place] [address]  Salicylate level     Status: None   Collection Time: 10/31/18  3:40 PM  Result Value Ref Range   Salicylate Lvl <3.3 2.8 - 30.0 mg/dL    Comment: Performed at [facility], [place] [address], [place], [place] [address]  Acetaminophen level     Status: Abnormal   Collection Time: 10/31/18  3:40 PM  Result Value Ref Range   Acetaminophen (Tylenol), Serum <10 (L) 10 - 30 ug/mL    Comment: (NOTE) Therapeutic concentrations vary significantly. A range of 10-30 ug/mL  may be an effective concentration for many patients. However, some  are best treated at concentrations outside of this range. Acetaminophen concentrations >150 ug/mL at 4 hours after ingestion  and >50 ug/mL at 12 hours after ingestion are often associated with  toxic reactions. Performed at [facility], [place] [address] [place], [place] [address]   Valproic acid level     Status: Abnormal   Collection Time: 10/31/18  3:40 PM  Result Value  Ref Range   Valproic Acid Lvl 102 (H) 50.0 - 100.0 ug/mL    Comment: Performed at [facility], [place] [address] [place], [place] [address]  Basic metabolic panel     Status: Abnormal   Collection Time: 11/01/18  6:57 AM  Result Value Ref Range   Sodium 148 (H) 135 - 145 mmol/L   Potassium 3.8 3.5 - 5.1 mmol/L    Comment: DELTA CHECK NOTED REPEATED TO VERIFY NO VISIBLE HEMOLYSIS    Chloride 110 98 - 111 mmol/L   CO2 29 22 - 32 mmol/L   Glucose, Bld 97 70 - 99 mg/dL   BUN 13 6 - 20 mg/dL   Creatinine, Ser 0.94 0.44 - 1.00 mg/dL   Calcium 10.0 8.9 - 10.3 mg/dL   GFR calc non Af Amer >60 >60 mL/min   GFR calc Af Amer >60 >60 mL/min   Anion gap 9 5 - 15    Comment: Performed at [facility], [place] [address], [place], [place] [address]  Valproic acid level     Status: Abnormal   Collection Time: 11/01/18  3:23 PM  Result Value Ref Range   Valproic Acid Lvl 48 (L) 50.0 - 100.0 ug/mL    Comment: Performed at [facility], [place] [address], [place], [place] [address]   Metabolic Disorder Labs:  Lab Results  Component Value Date   HGBA1C 5.3 12/10/2015   No results found for: PROLACTIN Lab Results  Component Value Date   CHOL 152 12/10/2015   TRIG 97 12/10/2015   HDL 49 12/10/2015   CHOLHDL 3.1 12/10/2015   VLDL 19 12/10/2015   LDLCALC 84 12/10/2015   LDLCALC 79 04/02/2014    Current Medications: Current Facility-Administered Medications  Medication Dose Route Frequency Provider Last Rate Last Dose  . acetaminophen (TYLENOL) tablet 650 mg  650 mg Oral Q6H PRN Ethelene Hal, NP      . alum & mag hydroxide-simeth (MAALOX/MYLANTA) 200-200-20 MG/5ML suspension 30 mL  30 mL Oral Q4H PRN Ethelene Hal, NP      . amLODipine (NORVASC) tablet 5 mg  5 mg Oral Daily Ethelene Hal, NP   5 mg at 11/02/18 6160  . hydrOXYzine (ATARAX/VISTARIL) tablet 25 mg  25 mg Oral TID PRN Ethelene Hal, NP      .  magnesium hydroxide (MILK OF MAGNESIA) suspension 30 mL  30 mL Oral Daily PRN Ethelene Hal, NP      . OLANZapine [facility]) tablet 20 mg  20 mg Oral QHS Ethelene Hal, NP   20 mg at 11/01/18 2050  . [START ON 11/03/2018] potassium chloride SA (K-DUR,KLOR-CON) CR tablet 20 mEq  20 mEq Oral Daily Cobos, Fernando A, MD      . propranolol ER (INDERAL LA) 24 hr capsule  60 mg  60 mg Oral Daily Ethelene Hal, NP   60 mg at 11/02/18 1610  . traZODone (DESYREL) tablet 50 mg  50 mg Oral QHS PRN Ethelene Hal, NP       PTA Medications: Medications Prior to Admission  Medication Sig Dispense Refill Last Dose  . ABILIFY MAINTENA 400 MG SRER injection Inject 400 mg into the muscle every 30 (thirty) days.  2 Past Month at Unknown time  . amLODipine (NORVASC) 5 MG tablet TAKE 1 TABLET BY MOUTH EVERY DAY (Patient taking differently: Take 5 mg by mouth daily. ) 30 tablet 6 10/31/2018 at Unknown time  . benztropine (COGENTIN) 0.5 MG tablet Take 1 tablet (0.5 mg total) by mouth at bedtime. 30 tablet 0 10/30/2018 at Unknown time  . chlorhexidine (PERIDEX) 0.12 % solution 15 mLs by Mouth Rinse route 2 (two) times daily as needed (mouth pain). Rinse and spit  3 10/31/2018 at Unknown time  . divalproex (DEPAKOTE ER) 500 MG 24 hr tablet Take 1,000 mg by mouth at bedtime.  1 10/30/2018 at Unknown time  . divalproex (DEPAKOTE SPRINKLE) 125 MG capsule Take 1 capsule (125 mg total) by mouth 2 (two) times daily. (Patient not taking: Reported on 08/11/2018)   Not Taking at Unknown time  . lithium carbonate (ESKALITH) 450 MG CR tablet Take 1 tablet (450 mg total) by mouth every 12 (twelve) hours. (Patient not taking: Reported on 07/11/2017) 60 tablet 0 Not Taking at Unknown time  . Multiple Vitamins-Minerals (MULTIVITAMIN ADULT) TABS Take 1 tablet by mouth daily.    10/31/2018 at Unknown time  . OLANZapine (ZYPREXA) 20 MG tablet Take 20 mg by mouth at bedtime.   2 10/30/2018 at Unknown time  . PREVIDENT  5000 BOOSTER PLUS 1.1 % PSTE Take 1 application by mouth 2 (two) times daily.  2 10/31/2018 at Unknown time  . propranolol (INDERAL) 20 MG tablet Take 1 tablet (20 mg total) by mouth 3 (three) times daily. (Patient not taking: Reported on 10/31/2018) 90 tablet 0 Not Taking at Unknown time  . propranolol ER (INDERAL LA) 60 MG 24 hr capsule Take 60 mg by mouth daily.  2 10/31/2018 at 1030  . QUEtiapine (SEROQUEL XR) 300 MG 24 hr tablet Take 2 tablets (600 mg total) by mouth at bedtime. (Patient not taking: Reported on 07/11/2017) 60 tablet 0 Not Taking at Unknown time   Musculoskeletal: Strength & Muscle Tone: within normal limits Gait & Station: normal Patient leans: N/A  Psychiatric Specialty Exam: Physical Exam  Nursing note and vitals reviewed.   Review of Systems  Constitutional: Positive for weight loss.  Neurological: Positive for tremors and weakness.  All other systems reviewed and are negative.   Blood pressure 137/81, pulse 77, temperature 98.2 F (36.8 C), temperature source Oral, resp. rate 18, weight 68 kg, SpO2 100 %.Body mass index is 28.34 kg/m.  General Appearance: Fairly Groomed  Eye Contact:  Fair- improves during session   Speech:  Slow  Volume:  Decreased  Mood:  presents depressed, anxious, but minimizes depression at this time  Affect:  congruent, anxious   Thought Process: slowed, brief thought blocking noted   Orientation:  Other:  of note, she is alert, attentive, and oriented to 11/02/18 and to [facility], [place], [place]  Thought Content:  denies hallucinations, but appears internally preoccupied at times , no clear delusions expressed   Suicidal Thoughts:  No denies suicidal ideations, denies self injurious ideations  Homicidal Thoughts:  No  Memory:  recent and remote fair   Judgement:  Fair  Insight:  Fair  Psychomotor Activity:  Decreased- no agitation, (+) resting distal tremor  Concentration:  Concentration: Fair and Attention Span: Fair  Recall:  [employer] of Knowledge:  Poor  Language:  Fair  Akathisia:  Negative  Handed:  Right  AIMS (if indicated):     Assets:  Desire for Improvement  ADL's:  Intact  Cognition:  WNL  Sleep:  Number of Hours: 5.25     Treatment Plan Summary: Daily contact with patient to assess and evaluate symptoms and progress in treatment.  Treatment Plan/Recommendations: 1. Admit for crisis management and stabilization, estimated length of stay 3-5 days.   2. Medication management to reduce current symptoms to base line and improve the patient's overall level of functioning: See MAR, Md's SRA & treatment plan.   Observation Level/Precautions:  15 minute checks  Laboratory:  Per ED  Psychotherapy: Group Milieu   Medications: See MAR.    Consultations: As needed   Discharge Concerns: Safety, mood stability.   Estimated LOS: 5-7 days.  Other: Admit to the [facility].   Physician Treatment Plan for Primary Diagnosis: Schizoaffective Disorder  Bipolar Type  Long Term Goal(s): Improvement in symptoms so as ready for discharge  Short Term Goals: Ability to identify changes in lifestyle to reduce recurrence of condition will improve and Ability to verbalize feelings will improve  Physician Treatment Plan for Secondary Diagnosis: Active Problems:   Schizophrenia ([place])  Long Term Goal(s): Improvement in symptoms so as ready for discharge  Short Term Goals: Ability to identify and develop effective coping behaviors will improve, Compliance with prescribed medications will improve and Ability to identify triggers associated with substance abuse/mental health issues will improve  I certify that inpatient services furnished can reasonably be expected to improve the patient's condition.    Lindell Spar, pmhnp, fnp-BC 12/7/20193:10 PM  I have discussed case with NP and have met with patient  Agree with NP note and assessment  52 year old single female, no children,  lives with sister. Patient presents as poor  historian at this time, has difficulty answering questions or elaborating on pertinent information. Some thought blocking noted . Patient was brought to ED by sister at the recommendation of outpatient provider, but details are limited and patient does not provide significant information.  She does report history of mental illness and states she has been diagnosed with Schizoaffective Disorder in the past . Chart notes indicate she had recently been switched from Lithium to Depakote, but it is not clear if this change coincided with increased symptoms or psychiatric deterioration. She describes some depression, and states energy level is low, but denies changes in sleep, appetite or anhedonia. She also denies any suicidal ideations today or recently.  She denies hallucinations, but presents with soft, limited speech and at times appears to briefly thought block, and possibly to be internally preoccupied based on how she looks around the room. She does present vaguely fearful, paranoid, and requesting to keep doors open during session. Chart notes indicate a prior psychiatric admission in January of 2017 for manic symptoms, psychosis, disordered thought process . At the time was diagnosed with Bipolar Disorder, Manic, and was discharged on Li, Seroquel, Seroquel.  Most recent meds are Depakote 1000 mgrs QHS ( appears to be new medication) , Abilify Maintena 400 mgrs IM q month ( patient unsure when she last received it), Zyprexa 20 mgrs QHS although patient reports she was  not taking it .  Medical History- reports history of HTN Of note, patient has visible hand resting tremors- she states this is not new and " comes and goes "  Dx- Schizoaffective Disorder  Bipolar Type  Plan- Inpatient treatment  As deterioration may have occurred in the context of recent medication change to Depakote will not resume this medication at present and will order Ammonia Serum level . Now on Zyprexa 20 mgrs QHS. D/C  Cogentin as EPS unlikely on Zyprexa and anticholinergic side effects could negatively affect cognitive status  .  Continue Norvasc, Inderal for HTN If patient provides consent , will make attempts to reach sister for collateral information and date of last Abilify Maintena administration  Check Lipid Panel and HgbA1C , Check Ammonia

## 2018-11-02 NOTE — Plan of Care (Signed)
  Problem: Education: Goal: Knowledge of [facility] Education information/materials will improve Outcome: Not Progressing Goal: Emotional status will improve Outcome: Not Progressing Goal: Mental status will improve Outcome: Not Progressing Goal: Verbalization of understanding the information provided will improve Outcome: Not Progressing   Problem: Activity: Goal: Interest or engagement in activities will improve Outcome: Not Progressing Goal: Sleeping patterns will improve Outcome: Not Progressing   Problem: Coping: Goal: Ability to verbalize frustrations and anger appropriately will improve Outcome: Not Progressing Goal: Ability to demonstrate self-control will improve Outcome: Not Progressing   Problem: Health Behavior/Discharge Planning: Goal: Identification of resources available to assist in meeting health care needs will improve Outcome: Not Progressing Goal: Compliance with treatment plan for underlying cause of condition will improve Outcome: Not Progressing   Problem: Physical Regulation: Goal: Ability to maintain clinical measurements within normal limits will improve Outcome: Not Progressing   Problem: Safety: Goal: Periods of time without injury will increase Outcome: Not Progressing   Problem: Activity: Goal: Will verbalize the importance of balancing activity with adequate rest periods Outcome: Not Progressing   Problem: Education: Goal: Will be free of psychotic symptoms Outcome: Not Progressing Goal: Knowledge of the prescribed therapeutic regimen will improve Outcome: Not Progressing   Problem: Coping: Goal: Coping ability will improve Outcome: Not Progressing Goal: Will verbalize feelings Outcome: Not Progressing   Problem: Health Behavior/Discharge Planning: Goal: Compliance with prescribed medication regimen will improve Outcome: Not Progressing   Problem: Nutritional: Goal: Ability to achieve adequate nutritional intake will  improve Outcome: Not Progressing   Problem: Role Relationship: Goal: Ability to communicate needs accurately will improve Outcome: Not Progressing Goal: Ability to interact with others will improve Outcome: Not Progressing   Problem: Safety: Goal: Ability to redirect hostility and anger into socially appropriate behaviors will improve Outcome: Not Progressing Goal: Ability to remain free from injury will improve Outcome: Not Progressing   Problem: Self-Care: Goal: Ability to participate in self-care as condition permits will improve Outcome: Not Progressing   Problem: Self-Concept: Goal: Will verbalize positive feelings about self Outcome: Not Progressing   

## 2018-11-02 NOTE — Progress Notes (Signed)
Nursing note 7p-7a  Pt observed isolating in room majority of this shift. Displayed a flat affect and anxius mood upon interaction with this Probation officer.Pt looking around room possibly responding to internal stimuli. Pt had difficulty answering some assessment questions. Mumbling/ garbled low speech with head and hand gestures." I was bad, I was disobedient to daddy"  Pt denies pain ,denies SI/HI, and also denies any audio or visual hallucinations at this time. Yes and no head gestures to most of this RNs assessment questions. Pt refused snack. Pt educated on falls and encouraged to wear nonskid socks when ambulating in the halls. Pt also encouraged to call for help if feeling weak or dizzy. Pt is now resting in bed with eyes closed, with no signs or symptoms of pain or distress noted. Pt continues to remain safe on the unit and is observed by rounding every 15 min. RN will continue to monitor.

## 2018-11-02 NOTE — BHH Counselor (Addendum)
Clinical Social Work Note  Met with pt to ask for her written consent to talk to her sister.  It took 10 minutes to get her to sign the paperwork after she readily agreed.  The pen hovered over the paper periods of time as though she had forgotten what she was in the process of doing.  She spoke only in a whisper and kept looking at the door as though afraid someone was listening, but when CSW offered to close the door, declined.    CSW attempted to call sister Rula [place] [phone], left a HIPAA-compliant message.  PSA could not be attempted today due to her mental status, will try again tomorrow.  Selmer Dominion, LCSW 11/02/2018, 4:11 PM

## 2018-11-02 NOTE — [facility] Suicide Risk Assessment (Addendum)
[facility] Admission Suicide Risk Assessment   Nursing information obtained from:   patient and chart  Demographic factors:   52 year old single female, lives with sister Current Mental Status:   see below Loss Factors:   unclear  Historical Factors:   reports history of Schizoaffective Disorder diagnosis in the past Risk Reduction Factors:   resilience, family support   Total Time spent with patient: 45 minutes Principal Problem:  Schizoaffective Disorder  Diagnosis:  Active Problems:   Bipolar affective disorder, current episode mixed ([place])  Subjective Data:   Continued Clinical Symptoms:    The "Alcohol Use Disorders Identification Test", Guidelines for Use in Primary Care, Second Edition.  World Pharmacologist [facility]). Score between 0-7:  no or low risk or alcohol related problems. Score between 8-15:  moderate risk of alcohol related problems. Score between 16-19:  high risk of alcohol related problems. Score 20 or above:  warrants further diagnostic evaluation for alcohol dependence and treatment.   CLINICAL FACTORS:  52 year old single female, no children,  lives with sister. Patient presents as poor historian at this time, has difficulty answering questions or elaborating on pertinent information. Some thought blocking noted . Patient was brought to ED by sister at the recommendation of outpatient provider, but details are limited and patient does not provide significant information.  She does report history of mental illness and states she has been diagnosed with Schizoaffective Disorder in the past . Chart notes indicate she had recently been switched from Lithium to Depakote, but it is not clear if this change coincided with increased symptoms or psychiatric deterioration. She describes some depression, and states energy level is low, but denies changes in sleep, appetite or anhedonia. She also denies any suicidal ideations today or recently.  She denies hallucinations, but  presents with soft, limited speech and at times appears to briefly thought block, and possibly to be internally preoccupied based on how she looks around the room. She does present vaguely fearful, paranoid, and requesting to keep doors open during session. Chart notes indicate a prior psychiatric admission in January of 2017 for manic symptoms, psychosis, disordered thought process . At the time was diagnosed with Bipolar Disorder, Manic, and was discharged on Li, Seroquel, Seroquel.  Most recent meds are Depakote 1000 mgrs QHS ( appears to be new medication) , Abilify Maintena 400 mgrs IM q month ( patient unsure when she last received it), Zyprexa 20 mgrs QHS although patient reports she was not taking it .  Medical History- reports history of HTN Of note, patient has visible hand resting tremors- she states this is not new and " comes and goes "  Dx- Schizoaffective Disorder  Bipolar Type  Plan- Inpatient treatment  As deterioration may have occurred in the context of recent medication change to Depakote will not resume this medication at present and will order Ammonia Serum level . Now on Zyprexa 20 mgrs QHS. D/C Cogentin as EPS unlikely on Zyprexa and anticholinergic side effects could negatively affect cognitive status  .  Continue Norvasc, Inderal for HTN If patient provides consent , will make attempts to reach sister for collateral information and date of last Abilify Maintena administration  Check Lipid Panel and HgbA1C , Check Ammonia     Musculoskeletal: Strength & Muscle Tone: within normal limits Gait & Station: slow gait Patient leans: N/A  Psychiatric Specialty Exam: Physical Exam  ROS denies chest pain, no shortness of breath, no vomiting   Blood pressure 137/81, pulse  77, temperature 98.2 F (36.8 C), temperature source Oral, resp. rate 18, weight 68 kg, SpO2 100 %.Body mass index is 28.34 kg/m.  General Appearance: Fairly Groomed  Eye Contact:  Fair- improves during  session   Speech:  Slow  Volume:  Decreased  Mood:  presents depressed, anxious, but minimizes depression at this time  Affect:  congruent, anxious   Thought Process: slowed, brief thought blocking noted   Orientation:  Other:  of note, she is alert, attentive, and oriented to 11/02/18 and to [facility], [place], [place]  Thought Content:  denies hallucinations, but appears internally preoccupied at times , no clear delusions expressed   Suicidal Thoughts:  No denies suicidal ideations, denies self injurious ideations  Homicidal Thoughts:  No  Memory:  recent and remote fair   Judgement:  Fair  Insight:  Fair  Psychomotor Activity:  Decreased- no agitation, (+) resting distal tremor  Concentration:  Concentration: Fair and Attention Span: Fair  Recall:  [employer] of Knowledge:  Poor  Language:  Fair  Akathisia:  Negative  Handed:  Right  AIMS (if indicated):     Assets:  Desire for Improvement  ADL's:  Intact  Cognition:  WNL  Sleep:  Number of Hours: 5.25      COGNITIVE FEATURES THAT CONTRIBUTE TO RISK:  Closed-mindedness and Loss of executive function    SUICIDE RISK:   Moderate:  Frequent suicidal ideation with limited intensity, and duration, some specificity in terms of plans, no associated intent, good self-control, limited dysphoria/symptomatology, some risk factors present, and identifiable protective factors, including available and accessible social support.  PLAN OF CARE: Patient will be admitted to inpatient psychiatric unit for stabilization and safety. Will provide and encourage milieu participation. Provide medication management and maked adjustments as needed.  Will follow daily.    I certify that inpatient services furnished can reasonably be expected to improve the patient's condition.   Jenne Campus, MD 11/02/2018, 1:41 PM

## 2018-11-03 DIAGNOSIS — E039 Hypothyroidism, unspecified: Secondary | ICD-10-CM

## 2018-11-03 DIAGNOSIS — F250 Schizoaffective disorder, bipolar type: Secondary | ICD-10-CM

## 2018-11-03 DIAGNOSIS — I10 Essential (primary) hypertension: Secondary | ICD-10-CM

## 2018-11-03 DIAGNOSIS — F419 Anxiety disorder, unspecified: Secondary | ICD-10-CM

## 2018-11-03 LAB — LIPID PANEL
Cholesterol: 150 mg/dL (ref 0–200)
HDL: 41 mg/dL (ref >40)
LDL Cholesterol: 76 mg/dL (ref 0–99)
Total CHOL/HDL Ratio: 3.7 RATIO
Triglycerides: 164 mg/dL — ABNORMAL HIGH (ref <150)
VLDL: 33 mg/dL (ref 0–40)

## 2018-11-03 LAB — HEMOGLOBIN A1C
Hgb A1c MFr Bld: 5.4 % (ref 4.8–5.6)
Mean Plasma Glucose: 108.28 mg/dL

## 2018-11-03 LAB — TSH: TSH: 0.082 u[IU]/mL — ABNORMAL LOW (ref 0.350–4.500)

## 2018-11-03 NOTE — B[facility] Suicide Risk Assessment (Signed)
[place] INPATIENT:  Family/Significant Other Suicide Prevention Education  Suicide Prevention Education:    The patient Paula Dickson provided written consent for family/significant other to be contacted for collateral information during admission and/or prior to discharge.  No SI was present during this hospital stay and SPE is not needed, would be counterproductive.  Berlin Hun Grossman-Orr 11/03/2018, 8:51 AM

## 2018-11-03 NOTE — BHH Counselor (Signed)
Adult Comprehensive Assessment  Patient ID: Paula Dickson, female   DOB: 17-Sep-1966, 52 y.o.   MRN: [id number]  Information Source: Information source: (Sister)  Current Stressors:  Patient states their primary concerns and needs for treatment are:: Being able to maintain, help with father like she used to.  Now is not washing dishes or anything. Patient states their goals for this hospitilization and ongoing recovery are:: Get her to start talking, answering questions Educational / Learning stressors: None reported Employment / Job issues: None reported Family Relationships: Father is 58yo and sick, worrying her. Financial / Lack of resources (include bankruptcy): None reported Housing / Lack of housing: None reported Physical health (include injuries & life threatening diseases): Got a letter about a mammogram she had, has been asked to come back in to talk about results, and refuses to do so. Social relationships: None reported Substance abuse: None reported Bereavement / Loss: None reported  Living/Environment/Situation:  Living Arrangements: Parent, Other relatives Living conditions (as described by patient or guardian): Good Who else lives in the home?: Father, with sister staying in the home 4 days a week How long has patient lived in current situation?: Her whole life What is atmosphere in current home: Loving, Supportive  Family History:  Marital status: Single Are you sexually active?: No What is your sexual orientation?: Heterosexual Has your sexual activity been affected by drugs, alcohol, medication, or emotional stress?: None reported  Does patient have children?: No  Childhood History:  By whom was/is the patient raised?: Both parents Description of patient's relationship with caregiver when they were a child: Good relationship with parents  Patient's description of current relationship with people who raised him/her: Mother is deceased, father is 16yo and has  cancer, they get along very well usually. How were you disciplined when you got in trouble as a child/adolescent?: None reported  Does patient have siblings?: Yes Number of Siblings: 7 Description of patient's current relationship with siblings: 5 brothers, 2 sisters;  good relationships Did patient suffer any verbal/emotional/physical/sexual abuse as a child?: No Did patient suffer from severe childhood neglect?: No Has patient ever been sexually abused/assaulted/raped as an adolescent or adult?: No Was the patient ever a victim of a crime or a disaster?: No Witnessed domestic violence?: No Has patient been effected by domestic violence as an adult?: No  Education:  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 Currently a student?: No Learning disability?: No  Employment/Work Situation:   Employment situation: On disability Why is patient on disability: Mental health How long has patient been on disability: 3 years Patient's job has been impacted by current illness: No What is the longest time patient has a held a job?: 9 years  Where was the patient employed at that time?: [employer]  Did You Receive Any Psychiatric Treatment/Services While in the [employer]?: (No Armed forces logistics/support/administrative officer) Are There Guns or Other Weapons in [place]?: No  Financial Resources:   Financial resources: Teacher, early years/pre, Medicare Does patient have a Programmer, applications or guardian?: Yes Name of representative payee or guardian: Sister Research officer, trade union  Alcohol/Substance Abuse:   What has been your use of drugs/alcohol within the last 12 months?: N/A Alcohol/Substance Abuse Treatment Hx: Denies past history Has alcohol/substance abuse ever caused legal problems?: No  Social Support System:   Patient's Community Support System: Good Describe Community Support System: Sister, father, church members Type of faith/religion: Darrick Meigs How does patient's faith help to cope with current illness?: Very  involved in the  church.  Just finished taking some very intense classes there, did not finish, "a little bit too much."  Leisure/Recreation:   Leisure and Hobbies: reading, writing, taking classes  Strengths/Needs:   What is the patient's perception of their strengths?: Good communicator when her regular self.  Took a public speaking class and interacts during classes. Patient states they can use these personal strengths during their treatment to contribute to their recovery: Talk about what is going on with you. Patient states these barriers may affect/interfere with their treatment: None Patient states these barriers may affect their return to the community: None Other important information patient would like considered in planning for their treatment: None  Discharge Plan:   Currently receiving community mental health services: Yes (From Whom)([facility], med mgmt & therapy) Patient states concerns and preferences for aftercare planning are: Return to current providers Patient states they will know when they are safe and ready for discharge when: When she is grinning and acting happy. Does patient have access to transportation?: Yes Does patient have financial barriers related to discharge medications?: No Patient description of barriers related to discharge medications: Disability income, Medicare Will patient be returning to same living situation after discharge?: Yes  Summary/Recommendations:   Summary and Recommendations (to be completed by the evaluator): Patient is a 52yo female admitted with thought blocking and recent medication changes.  Per family report, her condition has deteriorated since she was started on an injectable in Sept/Oct and since that time she has ceased all her normal activities.  Primary stressors include her father's health and receiving a letter to come in to discuss her mammogramd.  Patient will benefit from crisis stabilization, medication  evaluation, group therapy and psychoeducation, in addition to case management for discharge planning. At discharge it is recommended that Patient adhere to the established discharge plan and continue in treatment.  Maretta Los. 11/03/2018

## 2018-11-03 NOTE — B[facility] Group Notes (Signed)
[facility] LCSW Group Therapy Note  Date/Time:  11/03/2018  11:00AM-12:00PM  Type of Therapy and Topic:  Group Therapy:  Music and Mood  Participation Level:  Active   Description of Group: In this process group, members listened to a variety of genres of music and identified that different types of music evoke different responses.  Patients were encouraged to identify music that was soothing for them and music that was energizing for them.  Patients discussed how this knowledge can help with wellness and recovery in various ways including managing depression and anxiety as well as encouraging healthy sleep habits.    Therapeutic Goals: 1. Patients will explore the impact of different varieties of music on mood 2. Patients will verbalize the thoughts they have when listening to different types of music 3. Patients will identify music that is soothing to them as well as music that is energizing to them 4. Patients will discuss how to use this knowledge to assist in maintaining wellness and recovery 5. Patients will explore the use of music as a coping skill  Summary of Patient Progress:  At the beginning of group, patient expressed that she felt sad and rated this a 9 out of 10.  At the end of group she said she felt hopeful.  She had less thoughtblocking.  Therapeutic Modalities: Solution Focused Brief Therapy Activity   Selmer Dominion, LCSW

## 2018-11-03 NOTE — Progress Notes (Signed)
D. Pt presents with a flat affect, anxious mood - calm, cooperative behavior- observed in the milieu and attending groups today. Per pt's self inventory, pt rates her depression, hopelessness and anxiety an 8/8/7, respectively. . Pt currently denies SI/HI and AV hallucinations. A. Labs and vitals monitored. Pt compliant with medications. Pt supported emotionally and encouraged to express concerns and ask questions.   R. Pt remains safe with 15 minute checks. Will continue POC.

## 2018-11-03 NOTE — B[facility] Group Notes (Signed)
[place] Group Notes:  (Nursing)  Date:  11/03/2018  Time:  930 am Type of Therapy:  Nurse Education  Participation Level:  Active  Participation Quality:  Appropriate  Affect:  Flat  Cognitive:  Appropriate  Insight:  Improving  Engagement in Group:  Improving  Modes of Intervention:  Discussion and Education  Summary of Progress/Problems: Nurse led group played a non competitive learning/communication board game that fosters listening skills as well as self expression.  Waymond Cera 11/03/2018, 3:57 PM

## 2018-11-03 NOTE — BHH Counselor (Signed)
Clinical Social Work Note  Had phone call with sister Murdis Flitton [phone] to obtain collateral information.  Completed Psychosocial Assessment with her due to patient's ongoing thought blocking.  Sister shared that pt's behavior changed starting in September-October 2019 when she started on the injectable medication Abilify Maintena 400 mg monthly, which is coming due.  At that time she stopped doing things to help around her father's house such as washing dishes.  She was driving until then, and the family took away her ability drive because of her inability to focus.  She has had only one episode of "acting out" and at that point was verbally volatile but not physically aggressive.  She has stopped being willing to socialize with family members or even talk to them on the phone.  She goes to [facility] for therapy and medication management, and they have changed her medications multiple times to try to help.  She is supposed to go to the lab to get a Depakote "and other" labs drawn tomorrow.  Main concern expressed by sister is that pt is not her usual self.  She has been diagnosed with Bipolar disorder and has been on disability about 3 years, with sister as Programmer, applications.  She is worried that perhaps the injection is not the best medicine for her.  Selmer Dominion, LCSW 11/03/2018, 8:22 AM

## 2018-11-03 NOTE — Progress Notes (Addendum)
[facility] MD Progress Note  11/03/2018 3:32 PM Paula Dickson  MRN:  [id number]  Subjective: Paula Dickson reports, "I'm not alright. I'm not acting accordingly. I was not sleeping well at home".   (Per admission SRA): 52 year old single female, no children,  lives with sister. Patient presents as poor historian at this time, has difficulty answering questions or elaborating on pertinent information. Some thought blocking noted. Patient was brought to ED by sister at the recommendation of outpatient provider, but details are limited and patient does not provide significant information. She does report history of mental illness and states she has been diagnosed with Schizoaffective Disorder in the past . Chart notes indicate she had recently been switched from Lithium to Depakote, but it is not clear if this change coincided with increased symptoms or psychiatric deterioration. She describes some depression, and states energy level is low, but denies changes in sleep, appetite or anhedonia. She also denies any suicidal ideations today or recently. She denies hallucinations, but presents with soft, limited speech and at times appears to briefly thought block, and possibly to be internally preoccupied based on how she looks around the room. She does present vaguely fearful, paranoid, and requesting to keep doors open during session. Chart notes indicate a prior psychiatric admission in January of 2017 for manic symptoms, psychosis, disordered thought process . At the time was diagnosed with Bipolar Disorder, Manic, and was discharged on Li, Seroquel, Seroquel. Most recent meds are Depakote 1000 mgrs QHS ( appears to be new medication) , Abilify Maintena 400 mgrs IM q month ( patient unsure when she last received it), Zyprexa 20 mgrs QHS although patient reports she was not taking it .   Paula Dickson is seen, chart reviewed. The chart findings dicussed with the treatment team. He presents tearful. Has thought blocking episodes.  Seem to know what to say or report, but unable to get it out. It is obvious that this is frustrating to the patient. There are thought blocking episodes throughout this evaluation. She will cry when she could not make her needs known or express herself. She is visible on the unit, attending group sessions. She denies any SIHI, AVH, delusional thoughts or paranoia. We will continue his current plan of care as already in progress.  Principal Problem: Schizoaffective disorder, bipolar type ([place])  Diagnosis: Principal Problem:   Schizoaffective disorder, bipolar type ([place]) Active Problems:   Bipolar affective disorder, current episode mixed ([place])  Total Time spent with patient: 25 minutes  Past Psychiatric History: See H&P  Past Medical History:  Past Medical History:  Diagnosis Date  . Bipolar affective disorder ([place])   . History of arthritis   . History of chicken pox   . History of depression   . History of genital warts   . history of heart murmur   . History of high blood pressure   . History of thyroid disease   . History of UTI   . Hypertension   . Low TSH level 07/13/2017  . Schizophrenia [facility])     Past Surgical History:  Procedure Laterality Date  . ABLATION ON ENDOMETRIOSIS    . CYST REMOVAL NECK     around 11 years ago /benign  . MULTIPLE TOOTH EXTRACTIONS     Family History:  Family History  Problem Relation Age of Onset  . Arthritis Father   . Hyperlipidemia Father   . High blood pressure Father   . Diabetes Sister   . Diabetes Mother   .  Diabetes Brother   . Mental illness Brother   . Alcohol abuse Paternal Uncle   . Alcohol abuse Paternal Grandfather   . Breast cancer Maternal Aunt   . Breast cancer Paternal Aunt   . High blood pressure Sister   . Mental illness Other        runs in family   Family Psychiatric  History: See H&P  Social History:  Social History   Substance and Sexual Activity  Alcohol Use No     Social History   Substance and  Sexual Activity  Drug Use No    Social History   Socioeconomic History  . Marital status: Single    Spouse name: Not on file  . Number of children: Not on file  . Years of education: Not on file  . Highest education level: Not on file  Occupational History  . Not on file  Social Needs  . Financial resource strain: Not on file  . Food insecurity:    Worry: Not on file    Inability: Not on file  . Transportation needs:    Medical: Not on file    Non-medical: Not on file  Tobacco Use  . Smoking status: Never Smoker  . Smokeless tobacco: Never Used  Substance and Sexual Activity  . Alcohol use: No  . Drug use: No  . Sexual activity: Not Currently  Lifestyle  . Physical activity:    Days per week: Not on file    Minutes per session: Not on file  . Stress: Not on file  Relationships  . Social connections:    Talks on phone: Not on file    Gets together: Not on file    Attends religious service: Not on file    Active member of club or organization: Not on file    Attends meetings of clubs or organizations: Not on file    Relationship status: Not on file  Other Topics Concern  . Not on file  Social History Narrative  . Not on file   Additional Social History:   Sleep: Good  Appetite:  Good  Current Medications: Current Facility-Administered Medications  Medication Dose Route Frequency Provider Last Rate Last Dose  . acetaminophen (TYLENOL) tablet 650 mg  650 mg Oral Q6H PRN Ethelene Hal, NP      . alum & mag hydroxide-simeth (MAALOX/MYLANTA) 200-200-20 MG/5ML suspension 30 mL  30 mL Oral Q4H PRN Ethelene Hal, NP      . amLODipine (NORVASC) tablet 5 mg  5 mg Oral Daily Ethelene Hal, NP   5 mg at 11/03/18 0757  . hydrOXYzine (ATARAX/VISTARIL) tablet 25 mg  25 mg Oral TID PRN Ethelene Hal, NP   25 mg at 11/02/18 2143  . magnesium hydroxide (MILK OF MAGNESIA) suspension 30 mL  30 mL Oral Daily PRN Ethelene Hal, NP      .  OLANZapine [facility]) tablet 20 mg  20 mg Oral QHS Ethelene Hal, NP   20 mg at 11/02/18 2143  . potassium chloride SA (K-DUR,KLOR-CON) CR tablet 20 mEq  20 mEq Oral Daily [place], Myer Peer, MD   20 mEq at 11/03/18 0758  . propranolol ER (INDERAL LA) 24 hr capsule 60 mg  60 mg Oral Daily Ethelene Hal, NP   60 mg at 11/03/18 0757  . traZODone (DESYREL) tablet 50 mg  50 mg Oral QHS PRN Ethelene Hal, NP   50 mg at 11/02/18 2143   Lab  Results:  Results for orders placed or performed during the hospital encounter of 11/01/18 (from the past 48 hour(s))  Lipid panel     Status: Abnormal   Collection Time: 11/03/18  6:55 AM  Result Value Ref Range   Cholesterol 150 0 - 200 mg/dL   Triglycerides 164 (H) <150 mg/dL   HDL 41 >40 mg/dL   Total CHOL/HDL Ratio 3.7 RATIO   VLDL 33 0 - 40 mg/dL   LDL Cholesterol 76 0 - 99 mg/dL    Comment:        Total Cholesterol/HDL:CHD Risk Coronary Heart Disease Risk Table                     Men   Women  1/2 Average Risk   3.4   3.3  Average Risk       5.0   4.4  2 X Average Risk   9.6   7.1  3 X Average Risk  23.4   11.0        Use the calculated Patient Ratio above and the CHD Risk Table to determine the patient's CHD Risk.        ATP III CLASSIFICATION (LDL):  <100     mg/dL   Optimal  100-129  mg/dL   Near or Above                    Optimal  130-159  mg/dL   Borderline  160-189  mg/dL   High  >190     mg/dL   Very High Performed at [facility] [address] [place], [place] [address]   Hemoglobin A1c     Status: None   Collection Time: 11/03/18  6:55 AM  Result Value Ref Range   Hgb A1c MFr Bld 5.4 4.8 - 5.6 %    Comment: (NOTE) Pre diabetes:          5.7%-6.4% Diabetes:              >6.4% Glycemic control for   <7.0% adults with diabetes    Mean Plasma Glucose 108.28 mg/dL    Comment: Performed at [facility] [address] [place], [place] [address]  TSH     Status: Abnormal    Collection Time: 11/03/18  6:55 AM  Result Value Ref Range   TSH 0.082 (L) 0.350 - 4.500 uIU/mL    Comment: Performed by a 3rd Generation assay with a functional sensitivity of <=0.01 uIU/mL. Performed at [facility], [place] [address], [place], [place] [address]    Blood Alcohol level:  Lab Results  Component Value Date   [facility] <10 10/31/2018   ETH <5 11/94/1740   Metabolic Disorder Labs: Lab Results  Component Value Date   HGBA1C 5.4 11/03/2018   MPG 108.28 11/03/2018   No results found for: PROLACTIN Lab Results  Component Value Date   CHOL 150 11/03/2018   TRIG 164 (H) 11/03/2018   HDL 41 11/03/2018   CHOLHDL 3.7 11/03/2018   VLDL 33 11/03/2018   LDLCALC 76 11/03/2018   LDLCALC 84 12/10/2015   Physical Findings: AIMS: Facial and Oral Movements Muscles of Facial Expression: None, normal Lips and Perioral Area: None, normal Jaw: None, normal Tongue: None, normal,Extremity Movements Upper (arms, wrists, hands, fingers): None, normal Lower (legs, knees, ankles, toes): None, normal, Trunk Movements Neck, shoulders, hips: None, normal, Overall Severity Severity of abnormal movements (highest score from questions above): None,  normal Incapacitation due to abnormal movements: None, normal Patient's awareness of abnormal movements (rate only patient's report): No Awareness, Dental Status Current problems with teeth and/or dentures?: No Does patient usually wear dentures?: No  CIWA:    COWS:     Musculoskeletal: Strength & Muscle Tone: within normal limits Gait & Station: normal Patient leans: N/A  Psychiatric Specialty Exam: Physical Exam  Nursing note and vitals reviewed.   Review of Systems  Respiratory: Negative.  Negative for cough and shortness of breath.   Cardiovascular: Negative.  Negative for chest pain and palpitations.  Gastrointestinal: Negative.  Negative for abdominal pain, heartburn, nausea and vomiting.  Neurological: Negative for  dizziness and headaches.  Psychiatric/Behavioral: Positive for depression and hallucinations. Negative for memory loss, substance abuse and suicidal ideas. The patient is nervous/anxious. The patient does not have insomnia.     Blood pressure (!) 159/64, pulse 67, temperature 98.4 F (36.9 C), temperature source Oral, resp. rate 18, weight 68 kg, SpO2 100 %.Body mass index is 28.34 kg/m.  General Appearance:Fairly Groomed  Eye Contact:Fair- improves during session  Speech:Slow  Volume:Decreased  Mood:presents depressed, anxious, but minimizes depression at this time  Affect:congruent, anxious  Thought Process:slowed, brief thought blocking noted  Orientation:Other:of note, she is alert, attentive, and oriented to 11/02/18 and to [facility], [place], [place] hallucinations, but appears internally preoccupied at times , no clear delusions expressed  Suicidal Thoughts:Nodenies suicidal ideations, denies self injurious ideations  Homicidal Thoughts:No  Memory:recent and remote fair  Judgement:Fair  Insight:Fair  Psychomotor Activity:Decreased- no agitation, (+) resting distal tremor  Concentration:Concentration:[place] Attention Span: [place]  Akathisia:Negative  Handed:Right  AIMS (if indicated):   Assets:Desire for Improvement  ADL's:Intact  Cognition:WNL  Sleep:Number of Hours: 6.0       Treatment Plan Summary: Daily contact with patient to assess and evaluate symptoms and progress in treatment and Medication management.  - Continue inpatient hospitalization.  - Will continue today 11/03/2018 plan as below except where it is noted.  Mood control.     - Continue Olanzapine 20 mg po Q hs.  Anxiety.     - Continue Vistaril 25 mg po tid prn.  Insomnia.     - Continue  Trazodone 50 mg po Q bedtime.  HTN.     - Continue amlodipine 5 mg po daily.     -  Continue Propranolol 60 po daily for anxiety/HTN.  Patient to attend & participate in the group counseling sessions  Lindell Spar, NP, PMHNP, FNP-BC 11/03/2018, 3:32 PM    ..Agree with NP Progress Note

## 2018-11-03 NOTE — Progress Notes (Signed)
Psychoeducational Group Note  Date:  11/03/2018 Time:  0214  Group Topic/Focus:  Wrap-Up Group:   The focus of this group is to help patients review their daily goal of treatment and discuss progress on daily workbooks.  Participation Level: Did Not Attend  Participation Quality:  Not Applicable  Affect:  Not Applicable  Cognitive:  Not Applicable  Insight:  Not Applicable  Engagement in Group: Not Applicable  Additional Comments:  The patient did not attend group last evening since she was asleep in her bedroom.   Miriam Kestler S 11/03/2018, 2:13 AM

## 2018-11-03 NOTE — Progress Notes (Signed)
Patient ID: Paula Dickson, female   DOB: 1966/10/14, 52 y.o.   MRN: [id number] D: Patient observed in dayroom not interacting with peers. Pt reports she had a good day. Pt responds to questions was slow and does appear guarded at times. Pt mood and affect appears depressed and flat. Pt attended evening wrap up group and engaged in discussions. Pt denies  SI/HI/AVH and pain. A: Support and encouragement offered as needed to express needs. Medications administered as prescribed.  R: Patient is safe and cooperative on unit. Will continue to monitor  for safety and stability.

## 2018-11-04 DIAGNOSIS — F3130 Bipolar disorder, current episode depressed, mild or moderate severity, unspecified: Principal | ICD-10-CM

## 2018-11-04 MED ORDER — LORAZEPAM 0.5 MG PO TABS
0.5000 mg | ORAL_TABLET | Freq: Three times a day (TID) | ORAL | Status: AC
  Administered 2018-11-04 – 2018-11-06 (×6): 0.5 mg via ORAL
  Filled 2018-11-04 (×6): qty 1

## 2018-11-04 MED ORDER — OMEGA-3-ACID ETHYL ESTERS 1 G PO CAPS
1.0000 g | ORAL_CAPSULE | Freq: Two times a day (BID) | ORAL | Status: DC
  Administered 2018-11-04 – 2018-11-18 (×26): 1 g via ORAL
  Filled 2018-11-04 (×34): qty 1

## 2018-11-04 MED ORDER — PRENATAL MULTIVITAMIN CH
1.0000 | ORAL_TABLET | Freq: Every day | ORAL | Status: DC
  Administered 2018-11-04 – 2018-11-18 (×15): 1 via ORAL
  Filled 2018-11-04 (×16): qty 1

## 2018-11-04 MED ORDER — DIVALPROEX SODIUM ER 250 MG PO TB24
250.0000 mg | ORAL_TABLET | Freq: Every day | ORAL | Status: DC
  Administered 2018-11-04 – 2018-11-07 (×4): 250 mg via ORAL
  Filled 2018-11-04 (×6): qty 1

## 2018-11-04 MED ORDER — RISPERIDONE 2 MG PO TABS
2.0000 mg | ORAL_TABLET | Freq: Two times a day (BID) | ORAL | Status: DC
  Administered 2018-11-04 – 2018-11-07 (×6): 2 mg via ORAL
  Filled 2018-11-04 (×10): qty 1

## 2018-11-04 MED ORDER — TRAZODONE HCL 150 MG PO TABS
150.0000 mg | ORAL_TABLET | Freq: Every evening | ORAL | Status: DC | PRN
  Administered 2018-11-05 – 2018-11-10 (×6): 150 mg via ORAL
  Filled 2018-11-04 (×6): qty 1

## 2018-11-04 NOTE — Progress Notes (Signed)
D: Pt denies SI/HI/AVH. Pt is pleasant and cooperative. Pt stated she was "in a fog earlier". Pt stated she was getting better. Pt visible on the unit interacting  A: Pt was offered support and encouragement . Pt was encourage to attend groups. Q 15 minute checks were done for safety.   R:Pt attends groups and interacts well with peers and staff. Pt is taking medication. Pt has no complaints.Pt receptive to treatment and safety maintained on unit.   Problem: Activity: Goal: Interest or engagement in activities will improve Outcome: Progressing   Problem: Activity: Goal: Sleeping patterns will improve Outcome: Progressing   Problem: Coping: Goal: Ability to demonstrate self-control will improve Outcome: Progressing   Problem: Safety: Goal: Periods of time without injury will increase Outcome: Progressing

## 2018-11-04 NOTE — Tx Team (Signed)
Interdisciplinary Treatment and Diagnostic Plan Update  11/04/2018 Time of Session: 9:00am Paula Dickson MRN: [id number]  Principal Diagnosis: Bipolar disorder, most recent episode depressed ([name])  Secondary Diagnoses: Principal Problem:   Bipolar disorder, most recent episode depressed ([name]) Active Problems:   Bipolar affective disorder, current episode mixed ([name])   Current Medications:  Current Facility-Administered Medications  Medication Dose Route Frequency Provider Last Rate Last Dose  . acetaminophen (TYLENOL) tablet 650 mg  650 mg Oral Q6H PRN Ethelene Hal, NP      . alum & mag hydroxide-simeth (MAALOX/MYLANTA) 200-200-20 MG/5ML suspension 30 mL  30 mL Oral Q4H PRN Ethelene Hal, NP      . amLODipine (NORVASC) tablet 5 mg  5 mg Oral Daily Ethelene Hal, NP   5 mg at 11/04/18 0745  . hydrOXYzine (ATARAX/VISTARIL) tablet 25 mg  25 mg Oral TID PRN Ethelene Hal, NP   25 mg at 11/03/18 2112  . magnesium hydroxide (MILK OF MAGNESIA) suspension 30 mL  30 mL Oral Daily PRN Ethelene Hal, NP      . OLANZapine [facility]) tablet 20 mg  20 mg Oral QHS Ethelene Hal, NP   20 mg at 11/03/18 2112  . propranolol ER (INDERAL LA) 24 hr capsule 60 mg  60 mg Oral Daily Ethelene Hal, NP   60 mg at 11/04/18 0745  . traZODone (DESYREL) tablet 50 mg  50 mg Oral QHS PRN Ethelene Hal, NP   50 mg at 11/03/18 2112   PTA Medications: Medications Prior to Admission  Medication Sig Dispense Refill Last Dose  . ABILIFY MAINTENA 400 MG SRER injection Inject 400 mg into the muscle every 30 (thirty) days.  2 Past Month at Unknown time  . amLODipine (NORVASC) 5 MG tablet TAKE 1 TABLET BY MOUTH EVERY DAY (Patient taking differently: Take 5 mg by mouth daily. ) 30 tablet 6 10/31/2018 at Unknown time  . benztropine (COGENTIN) 0.5 MG tablet Take 1 tablet (0.5 mg total) by mouth at bedtime. 30 tablet 0 10/30/2018 at Unknown time  . chlorhexidine  (PERIDEX) 0.12 % solution 15 mLs by Mouth Rinse route 2 (two) times daily as needed (mouth pain). Rinse and spit  3 10/31/2018 at Unknown time  . divalproex (DEPAKOTE ER) 500 MG 24 hr tablet Take 1,000 mg by mouth at bedtime.  1 10/30/2018 at Unknown time  . divalproex (DEPAKOTE SPRINKLE) 125 MG capsule Take 1 capsule (125 mg total) by mouth 2 (two) times daily. (Patient not taking: Reported on 08/11/2018)   Not Taking at Unknown time  . lithium carbonate (ESKALITH) 450 MG CR tablet Take 1 tablet (450 mg total) by mouth every 12 (twelve) hours. (Patient not taking: Reported on 07/11/2017) 60 tablet 0 Not Taking at Unknown time  . Multiple Vitamins-Minerals (MULTIVITAMIN ADULT) TABS Take 1 tablet by mouth daily.    10/31/2018 at Unknown time  . OLANZapine (ZYPREXA) 20 MG tablet Take 20 mg by mouth at bedtime.   2 10/30/2018 at Unknown time  . PREVIDENT 5000 BOOSTER PLUS 1.1 % PSTE Take 1 application by mouth 2 (two) times daily.  2 10/31/2018 at Unknown time  . propranolol (INDERAL) 20 MG tablet Take 1 tablet (20 mg total) by mouth 3 (three) times daily. (Patient not taking: Reported on 10/31/2018) 90 tablet 0 Not Taking at Unknown time  . propranolol ER (INDERAL LA) 60 MG 24 hr capsule Take 60 mg by mouth daily.  2 10/31/2018 at 1030  .  QUEtiapine (SEROQUEL XR) 300 MG 24 hr tablet Take 2 tablets (600 mg total) by mouth at bedtime. (Patient not taking: Reported on 07/11/2017) 60 tablet 0 Not Taking at Unknown time    Patient Stressors:  Aging father, health issues  Patient Strengths:  Strong communicator at baseline  Treatment Modalities: Medication Management, Group therapy, Case management,  1 to 1 session with clinician, Psychoeducation, Recreational therapy.   Physician Treatment Plan for Primary Diagnosis: Bipolar disorder, most recent episode depressed ([name])  Medication Management: Evaluate patient's response, side effects, and tolerance of medication regimen.  Therapeutic Interventions: 1 to 1  sessions, Unit Group sessions and Medication administration.  Evaluation of Outcomes: Not Met  Physician Treatment Plan for Secondary Diagnosis: Principal Problem:   Bipolar disorder, most recent episode depressed ([place]) Active Problems:   Bipolar affective disorder, current episode mixed ([name])   Medication Management: Evaluate patient's response, side effects, and tolerance of medication regimen.  Therapeutic Interventions: 1 to 1 sessions, Unit Group sessions and Medication administration.  Evaluation of Outcomes: Not Met   RN Treatment Plan for Primary Diagnosis: Bipolar disorder, most recent episode depressed ([name]) Long Term Goal(s): Knowledge of disease and therapeutic regimen to maintain health will improve  Short Term Goals: Ability to verbalize frustration and anger appropriately will improve, Ability to demonstrate self-control, Ability to participate in decision making will improve, Ability to verbalize feelings will improve and Compliance with prescribed medications will improve  Medication Management: RN will administer medications as ordered by provider, will assess and evaluate patient's response and provide education to patient for prescribed medication. RN will report any adverse and/or side effects to prescribing provider.  Therapeutic Interventions: 1 on 1 counseling sessions, Psychoeducation, Medication administration, Evaluate responses to treatment, Monitor vital signs and CBGs as ordered, Perform/monitor CIWA, COWS, AIMS and Fall Risk screenings as ordered, Perform wound care treatments as ordered.  Evaluation of Outcomes: Not Met   LCSW Treatment Plan for Primary Diagnosis: Bipolar disorder, most recent episode depressed ([name]) Long Term Goal(s): Safe transition to appropriate next level of care at discharge, Engage patient in therapeutic group addressing interpersonal concerns.  Short Term Goals: Engage patient in aftercare planning with referrals and resources,  Increase social support, Increase ability to appropriately verbalize feelings, Increase emotional regulation and Increase skills for wellness and recovery  Therapeutic Interventions: Assess for all discharge needs, 1 to 1 time with Social worker, Explore available resources and support systems, Assess for adequacy in community support network, Educate family and significant other(s) on suicide prevention, Complete Psychosocial Assessment, Interpersonal group therapy.  Evaluation of Outcomes: Not Met   Progress in Treatment: Attending groups: Yes. Participating in groups: Yes. Taking medication as prescribed: Yes. Toleration medication: Yes. Family/Significant other contact made: Yes, individual(s) contacted:  sister, [name] Patient understands diagnosis: Yes. Discussing patient identified problems/goals with staff: Yes. Medical problems stabilized or resolved: No. Denies suicidal/homicidal ideation: Yes. Issues/concerns per patient self-inventory: Yes.  New problem(s) identified: No, Describe:  CSW continuing to assess  New Short Term/Long Term Goal(s): medication management for mood stabilization; elimination of SI thoughts; development of comprehensive mental wellness/sobriety plan.  Patient Goals:  "To work on some things. I know what I need to work on."  Discharge Plan or Barriers: Likely discharging home with family. [place] pamphlet, Mobile Crisis information, and information provided to patient for additional community support and resources.   Reason for Continuation of Hospitalization: Anxiety Depression Medication stabilization Other; describe Thought blocking  Estimated Length of Stay: 3-5 days  Attendees: Patient: Paula Dickson  [place] 11/04/2018 10:10 AM  Physician: Dr.Farah 11/04/2018 10:10 AM  Nursing: Yetta Flock 11/04/2018 10:10 AM  RN Care Manager: 11/04/2018 10:10 AM  Social Worker: Stephanie Acre, [place] 11/04/2018 10:10 AM  Recreational Therapist:  11/04/2018 10:10 AM  Other:   11/04/2018 10:10 AM  Other:  11/04/2018 10:10 AM  Other: 11/04/2018 10:10 AM    Scribe for Treatment Team: Joellen Jersey, [place] 11/04/2018 10:10 AM

## 2018-11-04 NOTE — [facility] Group Notes (Signed)
LCSW Group Therapy Notes 11/04/2018 2:55 PM  Type of Therapy and Topic: Group Therapy: Effective Communication  Participation Level: Active  Description of Group:  In this group patients will be asked to identify their own styles of communication as well as defining and identifying passive, assertive, and aggressive styles of communication. Participants will identify strategies to communicate in a more assertive manner in an effort to appropriately meet their needs. This group will be process-oriented, with patients participating in exploration of their own experiences as well as giving and receiving support and challenge from other group members.  Therapeutic Goals: 1. Patient will identify their personal communication style. 2. Patient will identify passive, assertive, and aggressive forms of communication. 3. Patient will identify strategies for developing more effective communication to appropriately meet their needs.   Summary of Patient Progress Patient quietly and appropriated listened throughout most of group but made relevant and insightful contributions. Patient states that she considers herself to be an assertive communicator and shared with another patient that although family members may have personality conflicts, they can still support one another.    Therapeutic Modalities:  Communication Skills Solution Focused Therapy Motivational [place], MSW, [facility] 11/04/2018 2:03 PM

## 2018-11-04 NOTE — Progress Notes (Signed)
[facility] MD Progress Note  11/04/2018 10:52 AM Paula Dickson  MRN:  [id number] Subjective:    Paula Dickson is 52 years of age she is thought to suffer from a schizoaffective/bipolar type condition and presented with thought blocking and difficulty concentrating Other relevant data included a Depakote level at 102 and a TSH level that was quite low below 0.08. She is now alert and oriented to person place and situation she continues to have thought blocking and it comes out as a hesitant speech pattern but she generally does keep track of the questions and tries to answer them as best she can. She does not have thoughts of harming herself or others she does not have current hallucinations again acknowledging the formal thought disorder in her own words trouble collecting her thoughts she has met with the treatment team her goal is to simply get her thoughts in order to get her meds correct again she has difficulty articulating this.  No EPS or TD no thoughts of harming self or others  Principal Problem: Bipolar disorder, most recent episode depressed ([place]) Diagnosis: Principal Problem:   Bipolar disorder, most recent episode depressed ([place]) Active Problems:   Bipolar affective disorder, current episode mixed ([place])  Total Time spent with patient: 20 minutes  Past Psychiatric History: see hx  Past Medical History:  Past Medical History:  Diagnosis Date  . Bipolar affective disorder ([place])   . History of arthritis   . History of chicken pox   . History of depression   . History of genital warts   . history of heart murmur   . History of high blood pressure   . History of thyroid disease   . History of UTI   . Hypertension   . Low TSH level 07/13/2017  . Schizophrenia [facility])     Past Surgical History:  Procedure Laterality Date  . ABLATION ON ENDOMETRIOSIS    . CYST REMOVAL NECK     around 11 years ago /benign  . MULTIPLE TOOTH EXTRACTIONS     Family History:  Family History  Problem  Relation Age of Onset  . Arthritis Father   . Hyperlipidemia Father   . High blood pressure Father   . Diabetes Sister   . Diabetes Mother   . Diabetes Brother   . Mental illness Brother   . Alcohol abuse Paternal Uncle   . Alcohol abuse Paternal Grandfather   . Breast cancer Maternal Aunt   . Breast cancer Paternal Aunt   . High blood pressure Sister   . Mental illness Other        runs in family  Social History:  Social History   Substance and Sexual Activity  Alcohol Use No     Social History   Substance and Sexual Activity  Drug Use No    Social History   Socioeconomic History  . Marital status: Single    Spouse name: Not on file  . Number of children: Not on file  . Years of education: Not on file  . Highest education level: Not on file  Occupational History  . Not on file  Social Needs  . Financial resource strain: Not on file  . Food insecurity:    Worry: Not on file    Inability: Not on file  . Transportation needs:    Medical: Not on file    Non-medical: Not on file  Tobacco Use  . Smoking status: Never Smoker  . Smokeless tobacco: Never Used  Substance  and Sexual Activity  . Alcohol use: No  . Drug use: No  . Sexual activity: Not Currently  Lifestyle  . Physical activity:    Days per week: Not on file    Minutes per session: Not on file  . Stress: Not on file  Relationships  . Social connections:    Talks on phone: Not on file    Gets together: Not on file    Attends religious service: Not on file    Active member of club or organization: Not on file    Attends meetings of clubs or organizations: Not on file    Relationship status: Not on file  Other Topics Concern  . Not on file  Social History Narrative  . Not on file   Sleep: Fair  Appetite:  Fair  Current Medications: Current Facility-Administered Medications  Medication Dose Route Frequency Provider Last Rate Last Dose  . acetaminophen (TYLENOL) tablet 650 mg  650 mg Oral Q6H  PRN Ethelene Hal, NP      . alum & mag hydroxide-simeth (MAALOX/MYLANTA) 200-200-20 MG/5ML suspension 30 mL  30 mL Oral Q4H PRN Ethelene Hal, NP      . amLODipine (NORVASC) tablet 5 mg  5 mg Oral Daily Ethelene Hal, NP   5 mg at 11/04/18 0745  . hydrOXYzine (ATARAX/VISTARIL) tablet 25 mg  25 mg Oral TID PRN Ethelene Hal, NP   25 mg at 11/03/18 2112  . LORazepam (ATIVAN) tablet 0.5 mg  0.5 mg Oral TID Johnn Hai, MD      . magnesium hydroxide (MILK OF MAGNESIA) suspension 30 mL  30 mL Oral Daily PRN Ethelene Hal, NP      . prenatal multivitamin tablet 1 tablet  1 tablet Oral Q1200 Johnn Hai, MD      . propranolol ER (INDERAL LA) 24 hr capsule 60 mg  60 mg Oral Daily Ethelene Hal, NP   60 mg at 11/04/18 0745  . risperiDONE (RISPERDAL) tablet 2 mg  2 mg Oral BID Johnn Hai, MD      . traZODone (DESYREL) tablet 150 mg  150 mg Oral QHS PRN Johnn Hai, MD        Lab Results:  Results for orders placed or performed during the hospital encounter of 11/01/18 (from the past 48 hour(s))  Lipid panel     Status: Abnormal   Collection Time: 11/03/18  6:55 AM  Result Value Ref Range   Cholesterol 150 0 - 200 mg/dL   Triglycerides 164 (H) <150 mg/dL   HDL 41 >40 mg/dL   Total CHOL/HDL Ratio 3.7 RATIO   VLDL 33 0 - 40 mg/dL   LDL Cholesterol 76 0 - 99 mg/dL    Comment:        Total Cholesterol/HDL:CHD Risk Coronary Heart Disease Risk Table                     Men   Women  1/2 Average Risk   3.4   3.3  Average Risk       5.0   4.4  2 X Average Risk   9.6   7.1  3 X Average Risk  23.4   11.0        Use the calculated Patient Ratio above and the CHD Risk Table to determine the patient's CHD Risk.        ATP III CLASSIFICATION (LDL):  <100     mg/dL   Optimal  100-129  mg/dL   Near or Above                    Optimal  130-159  mg/dL   Borderline  160-189  mg/dL   High  >190     mg/dL   Very High Performed at [facility] [address] [place], [place] [address]   Hemoglobin A1c     Status: None   Collection Time: 11/03/18  6:55 AM  Result Value Ref Range   Hgb A1c MFr Bld 5.4 4.8 - 5.6 %    Comment: (NOTE) Pre diabetes:          5.7%-6.4% Diabetes:              >6.4% Glycemic control for   <7.0% adults with diabetes    Mean Plasma Glucose 108.28 mg/dL    Comment: Performed at [facility] [address] [place], [place] [address]  TSH     Status: Abnormal   Collection Time: 11/03/18  6:55 AM  Result Value Ref Range   TSH 0.082 (L) 0.350 - 4.500 uIU/mL    Comment: Performed by a 3rd Generation assay with a functional sensitivity of <=0.01 uIU/mL. Performed at [facility], [place] [address], [place],  [address]     Blood Alcohol level:  Lab Results  Component Value Date   [facility] <10 10/31/2018   ETH <5 91/47/8295    Metabolic Disorder Labs: Lab Results  Component Value Date   HGBA1C 5.4 11/03/2018   MPG 108.28 11/03/2018   No results found for: PROLACTIN Lab Results  Component Value Date   CHOL 150 11/03/2018   TRIG 164 (H) 11/03/2018   HDL 41 11/03/2018   CHOLHDL 3.7 11/03/2018   VLDL 33 11/03/2018   LDLCALC 76 11/03/2018   LDLCALC 84 12/10/2015    Physical Findings: AIMS: Facial and Oral Movements Muscles of Facial Expression: None, normal Lips and Perioral Area: None, normal Jaw: None, normal Tongue: None, normal,Extremity Movements Upper (arms, wrists, hands, fingers): None, normal Lower (legs, knees, ankles, toes): None, normal, Trunk Movements Neck, shoulders, hips: None, normal, Overall Severity Severity of abnormal movements (highest score from questions above): None, normal Incapacitation due to abnormal movements: None, normal Patient's awareness of abnormal movements (rate only patient's report): No Awareness, Dental Status Current problems with teeth and/or dentures?: No Does patient usually wear dentures?: No  CIWA:     COWS:     Musculoskeletal: Strength & Muscle Tone: within normal limits Gait & Station: normal Patient leans: N/A  Psychiatric Specialty Exam: Physical Exam  ROS  Blood pressure (!) 125/104, pulse 90, temperature 98.7 F (37.1 C), temperature source Oral, resp. rate 18, weight 68 kg, SpO2 100 %.Body mass index is 28.34 kg/m.  General Appearance: Disheveled  Eye Contact:  Fair  Speech:  Slow  Volume:  Decreased  Mood:  Dysphoric  Affect:  Restricted  Thought Process:  Disorganized  Orientation:  Full (Time, Place, and Person)  Thought Content:  Tangential  Suicidal Thoughts:  No  Homicidal Thoughts:  No  Memory:  Remote;   Fair  Judgement:  Good  Insight:  Good  Psychomotor Activity:  Decreased  Concentration:  Concentration: Poor  Recall:  Poor  Fund of Knowledge:  Poor  Language:  Poor  Akathisia:  Negative  Handed:  Right  AIMS (if indicated):     Assets:  Desire for Improvement  ADL's:  Impaired  Cognition:  Impaired,  Mild  Sleep:  Number of Hours: 5   Resume Depakote at a low level check TSH and T4 change antipsychotic regimen at [facility]  Treatment Plan Summary: Daily contact with patient to assess and evaluate symptoms and progress in treatment and Medication management  Johnn Hai, MD 11/04/2018, 10:52 AM

## 2018-11-04 NOTE — Progress Notes (Signed)
Recreation Therapy Notes  Date: 12.9.19 Time: 1000 Location: 500 Hall Dayroom  Group Topic: Coping Skills  Goal Area(s) Addresses:  Patient will be able to identify positive coping skills.                  Patient will be able to identify benefit of using coping skills post d/c.  Behavioral Response: Engaged  Intervention: Worksheet  Activity: Mind map.  LRT and patients filled in the first 8 boxes (depression, anxiety, anger, mood swings, drugs/alcohol, finances, transportation, family/friends) together with situations that would require the use of coping skills.  Patients were to then come up with three coping skills for each scenario individually before reforming as a group.  Education: Radiographer, therapeutic, Dentist.   Education Outcome: Acknowledges understanding/In group clarification offered/Needs additional education.   Clinical Observations/Feedback: Pt was quiet and had a hard time getting her thoughts together.  LRT attempted to assist pt in completing activity but pt couldn't get it.  Pt was pleasant and engaged throughout activity.    [name], LRT/CTRS    Ria Comment, Ashara Lounsbury A 11/04/2018 1:12 PM

## 2018-11-04 NOTE — Progress Notes (Signed)
Patient presents with an anxious and depressed affect. Patient is tremulous in her hands. Denies SI HI AVH. Denies physical pain this morning. Patient is thought blocking this morning and is having trouble getting her thoughts out. Patient is compliant with medications prescribed per provider. No side effects noted. Safety is maintained with 15 minute checks as well as environmental checks. Will continue to monitor and provide support.

## 2018-11-04 NOTE — Progress Notes (Signed)
Recreation Therapy Notes  INPATIENT RECREATION THERAPY ASSESSMENT  Patient Details Name: Paula Dickson MRN: [id number] DOB: 01-Apr-1966 Today's Date: 11/04/2018       Information Obtained From: Patient  Able to Participate in Assessment/Interview: Yes  Patient Presentation: Confused(very religious )  Reason for Admission (Per Patient): Other (Comments)(Pt stated she got off track with her medication)  Patient Stressors: Other (Comment)(Pt stated she and her moral actions were her stressor.)  Coping Skills:   Isolation, Journal, Sports, TV, Music, Talk, [employer] Bath/Shower  Leisure Interests (2+):  Exercise - Walking, Individual - Reading, Sports - Exercise (Comment)(Stationary bike)  Frequency of Recreation/Participation: Other (Comment)(Every now and then)  Awareness of Community Resources:  Yes  [employer]:  Park, Art therapist, Engineer, building services, Patent examiner  Current Use: Yes  If no, Barriers?:    Expressed Interest in [place]: No  [employer] of Residence:  Investment banker, corporate  Patient Main Form of Transportation: Musician  Patient Strengths:  Unable to identify  Patient Identified Areas of Improvement:  Unable to identify  Patient Goal for Hospitalization:  Unable to identify  Current SI (including self-harm):  No  Current HI:  No  Current AVH: No  Staff Intervention Plan: Group Attendance, Collaborate with Interdisciplinary Treatment Team  Consent to Intern Participation: N/A    [name], LRT/CTRS   [name] A 11/04/2018, 2:40 PM

## 2018-11-04 NOTE — Plan of Care (Signed)
Patient remains safe on the unit at this time.  Problem: Education: Goal: Knowledge of [facility] Education information/materials will improve Outcome: Adequate for Discharge   Problem: Education: Goal: Emotional status will improve Outcome: Adequate for Discharge   Problem: Education: Goal: Mental status will improve Outcome: Adequate for Discharge   Problem: Education: Goal: Verbalization of understanding the information provided will improve Outcome: Adequate for Discharge

## 2018-11-05 LAB — URINALYSIS, ROUTINE W REFLEX MICROSCOPIC
Bilirubin Urine: NEGATIVE
Glucose, UA: NEGATIVE mg/dL
Hgb urine dipstick: NEGATIVE
Ketones, ur: NEGATIVE mg/dL
Nitrite: NEGATIVE
Protein, ur: NEGATIVE mg/dL
Specific Gravity, Urine: 1.004 — ABNORMAL LOW (ref 1.005–1.030)
pH: 6 (ref 5.0–8.0)

## 2018-11-05 LAB — T4, FREE: Free T4: 1.19 ng/dL (ref 0.82–1.77)

## 2018-11-05 MED ORDER — BIOTENE DRY MOUTH MT LIQD
15.0000 mL | OROMUCOSAL | Status: DC | PRN
  Filled 2018-11-05: qty 15

## 2018-11-05 NOTE — Progress Notes (Signed)
[facility] MD Progress Note  11/05/2018 9:10 AM Paula Dickson  MRN:  [id number] Subjective:    Patient's thought blocking has improved-which is impressive thus far and staff confirms that this is true however she will give [place] coherent answers for about 2 to 3 minutes of the interview before her speech becomes little more bizarre and disjointed not exactly delusions that are formed but again she becomes more disorganized and more she concentrates on answering more detailed questions.  No EPS or TD  Current thoughts of harming self or others  Current auditory visual hallucinations basically a formal thought disorder that is improving  Principal Problem: Bipolar disorder, most recent episode depressed ([place]) Diagnosis: Principal Problem:   Bipolar disorder, most recent episode depressed ([place]) Active Problems:   Bipolar affective disorder, current episode mixed ([place])  Total Time spent with patient: 20 minutes Past Medical History:  Past Medical History:  Diagnosis Date  . Bipolar affective disorder ([place])   . History of arthritis   . History of chicken pox   . History of depression   . History of genital warts   . history of heart murmur   . History of high blood pressure   . History of thyroid disease   . History of UTI   . Hypertension   . Low TSH level 07/13/2017  . Schizophrenia [facility])     Past Surgical History:  Procedure Laterality Date  . ABLATION ON ENDOMETRIOSIS    . CYST REMOVAL NECK     around 11 years ago /benign  . MULTIPLE TOOTH EXTRACTIONS     Family History:  Family History  Problem Relation Age of Onset  . Arthritis Father   . Hyperlipidemia Father   . High blood pressure Father   . Diabetes Sister   . Diabetes Mother   . Diabetes Brother   . Mental illness Brother   . Alcohol abuse Paternal Uncle   . Alcohol abuse Paternal Grandfather   . Breast cancer Maternal Aunt   . Breast cancer Paternal Aunt   . High blood pressure Sister   . Mental illness Other         runs in family   Social History:  Social History   Substance and Sexual Activity  Alcohol Use No     Social History   Substance and Sexual Activity  Drug Use No    Social History   Socioeconomic History  . Marital status: Single    Spouse name: Not on file  . Number of children: Not on file  . Years of education: Not on file  . Highest education level: Not on file  Occupational History  . Not on file  Social Needs  . Financial resource strain: Not on file  . Food insecurity:    Worry: Not on file    Inability: Not on file  . Transportation needs:    Medical: Not on file    Non-medical: Not on file  Tobacco Use  . Smoking status: Never Smoker  . Smokeless tobacco: Never Used  Substance and Sexual Activity  . Alcohol use: No  . Drug use: No  . Sexual activity: Not Currently  Lifestyle  . Physical activity:    Days per week: Not on file    Minutes per session: Not on file  . Stress: Not on file  Relationships  . Social connections:    Talks on phone: Not on file    Gets together: Not on file    Attends  religious service: Not on file    Active member of club or organization: Not on file    Attends meetings of clubs or organizations: Not on file    Relationship status: Not on file  Other Topics Concern  . Not on file  Social History Narrative  . Not on file   Additional Social History:                         Sleep: Fair  Appetite:  Fair  Current Medications: Current Facility-Administered Medications  Medication Dose Route Frequency Provider Last Rate Last Dose  . acetaminophen (TYLENOL) tablet 650 mg  650 mg Oral Q6H PRN Ethelene Hal, NP      . alum & mag hydroxide-simeth (MAALOX/MYLANTA) 200-200-20 MG/5ML suspension 30 mL  30 mL Oral Q4H PRN Ethelene Hal, NP      . amLODipine (NORVASC) tablet 5 mg  5 mg Oral Daily Ethelene Hal, NP   5 mg at 11/05/18 0757  . antiseptic oral rinse (BIOTENE) solution 15 mL  15  mL Mouth Rinse PRN Lindon Romp A, NP      . divalproex (DEPAKOTE ER) 24 hr tablet 250 mg  250 mg Oral QHS Johnn Hai, MD   250 mg at 11/04/18 2119  . hydrOXYzine (ATARAX/VISTARIL) tablet 25 mg  25 mg Oral TID PRN Ethelene Hal, NP   25 mg at 11/04/18 2119  . LORazepam (ATIVAN) tablet 0.5 mg  0.5 mg Oral TID Johnn Hai, MD   0.5 mg at 11/05/18 0759  . magnesium hydroxide (MILK OF MAGNESIA) suspension 30 mL  30 mL Oral Daily PRN Ethelene Hal, NP      . omega-3 acid ethyl esters (LOVAZA) capsule 1 g  1 g Oral BID Johnn Hai, MD   1 g at 11/05/18 0757  . prenatal multivitamin tablet 1 tablet  1 tablet Oral Q1200 Johnn Hai, MD   1 tablet at 11/04/18 1218  . propranolol ER (INDERAL LA) 24 hr capsule 60 mg  60 mg Oral Daily Ethelene Hal, NP   60 mg at 11/05/18 0757  . risperiDONE (RISPERDAL) tablet 2 mg  2 mg Oral BID Johnn Hai, MD   2 mg at 11/05/18 0757  . traZODone (DESYREL) tablet 150 mg  150 mg Oral QHS PRN Johnn Hai, MD        Lab Results:  Results for orders placed or performed during the hospital encounter of 11/01/18 (from the past 48 hour(s))  T4, free     Status: None   Collection Time: 11/04/18  6:43 PM  Result Value Ref Range   Free T4 1.19 0.82 - 1.77 ng/dL    Comment: (NOTE) Biotin ingestion may interfere with free T4 tests. If the results are inconsistent with the TSH level, previous test results, or the clinical presentation, then consider biotin interference. If needed, order repeat testing after stopping biotin. Performed at [facility], [place] [address], [place], [place] [address]     Blood Alcohol level:  Lab Results  Component Value Date   [facility] <10 10/31/2018   ETH <5 83/15/1761    Metabolic Disorder Labs: Lab Results  Component Value Date   HGBA1C 5.4 11/03/2018   MPG 108.28 11/03/2018   No results found for: PROLACTIN Lab Results  Component Value Date   CHOL 150 11/03/2018   TRIG 164 (H) 11/03/2018   HDL 41  11/03/2018   CHOLHDL 3.7 11/03/2018  VLDL 33 11/03/2018   LDLCALC 76 11/03/2018   LDLCALC 84 12/10/2015    Physical Findings: AIMS: Facial and Oral Movements Muscles of Facial Expression: None, normal Lips and Perioral Area: None, normal Jaw: None, normal Tongue: None, normal,Extremity Movements Upper (arms, wrists, hands, fingers): None, normal Lower (legs, knees, ankles, toes): None, normal, Trunk Movements Neck, shoulders, hips: None, normal, Overall Severity Severity of abnormal movements (highest score from questions above): None, normal Incapacitation due to abnormal movements: None, normal Patient's awareness of abnormal movements (rate only patient's report): No Awareness, Dental Status Current problems with teeth and/or dentures?: No Does patient usually wear dentures?: No  CIWA:    COWS:     Musculoskeletal: Strength & Muscle Tone: within normal limits Gait & Station: normal Patient leans: N/A  Psychiatric Specialty Exam: Physical Exam  ROS  Blood pressure 130/74, pulse (!) 102, temperature 98.3 F (36.8 C), temperature source Oral, resp. rate 18, weight 68 kg, SpO2 100 %.Body mass index is 28.34 kg/m.  General Appearance: Casual  Eye Contact:  Good  Speech:  Clear and Coherent  Volume:  Normal  Mood:  Euthymic  Affect:  Non-Congruent  Thought Process:  Irrelevant  Orientation:  Full (Time, Place, and Person)  Thought Content:  Illogical  Suicidal Thoughts:  No  Homicidal Thoughts:  No  Memory:  Immediate;   Fair  Judgement:  Fair  Insight:  Fair  Psychomotor Activity:  Normal  Concentration:  Concentration: Good  Recall:  [place] of Knowledge:  Fair  Language:  Fair  Akathisia:  Negative  Handed:  Right  AIMS (if indicated):     Assets:  Communication Skills  ADL's:  Intact  Cognition:  WNL  Sleep:  Number of Hours: 0.25   We have made minor med adjustments continue cognitive and reality based therapies continue current  precautions  Treatment Plan Summary: Daily contact with patient to assess and evaluate symptoms and progress in treatment and Medication management  Kazmir Oki, MD 11/05/2018, 9:10 AM

## 2018-11-05 NOTE — Plan of Care (Addendum)
Problem: Activity: Goal: Interest or engagement in activities will improve Outcome: Progressing   Problem: Safety: Goal: Periods of time without injury will increase Outcome: Progressing   Problem: Nutritional: Goal: Ability to achieve adequate nutritional intake will improve Outcome: Progressing  DAR Note: Pt A & O to self, place and events. Denies SI, HI, AVh and pain when assessed. Visible in milieu observed interacting well with peers and staff throughout this shift. Noted in groups and was engaged. Pt's speech was spontaneous on assessment with periods of lucid and disorganized thought process. Per pt "sometimes I really get confused because I don't know if I'm a teenager or an adult". Pt reports poor sleep last night but states "my appetite is good, I ate all my breakfast and lunch". Pt complained of frequent urination as well as pain on urination "just at the last drip, I feel this heavy pain in my stomach every time". Dr. Parke Poisson made aware of pt's concern related to discomfort during urination. New order received for U/A and culture.  All medications given per MD's orders and effects monitored. Safety checks maintained without self harm gestures or outburst. Encouraged pt to voice concerns and comply with treatment regimen including groups.  Pt has been receptive to care and cooperative with unit routines.  Tolerates all PO intake well. Pt remains safe on and off unit.

## 2018-11-05 NOTE — BHH Group Notes (Signed)
LCSW Group Therapy Note 11/05/2018 12:28 PM  Type of Therapy and Topic: Group Therapy: DBT House  Participation Level: Active   Description of Group:  In this group patients will be encouraged to explore  their values, behaviors they want to change, emotions they wish to increase, protective factors, supports, coping skills, and motivational factors. They will be guided to discuss their thoughts, feelings, and behaviors related to these obstacles. The group will be asked to individually process the activity and share their insights with the group. This group will be process-oriented, with patients participating in exploration of their own experiences as well as giving and receiving support and challenge from other group members.   Therapeutic Goals: 1. Patient will identify their values 2. Patient will identify behaviors they wish to modify  3. Patient will identify feelings and emotions they wish to increase 4. Patient will identify strengths, supports, protective factors 5. Patient will identify coping skills 6. Patient will identify goals and motivating factors for change   Summary of Patient Progress Paula Dickson actively and appropriately participated in group. She identified family and friends as her supports and protective factors. Patient's goals are to start working again/get a job.   Therapeutic Modalities:  Dialectical Behavioral Therapy  Motivational Interviewing Relapse Prevention Therapy

## 2018-11-05 NOTE — Progress Notes (Signed)
Recreation Therapy Notes  Date: 12.10.19 Time: 1000 Location: 500 Hall Dayroom  Group Topic: Wellness  Goal Area(s) Addresses:  Patient will define components of whole wellness. Patient will verbalize benefit of whole wellness.  Behavioral Response: None  Intervention: Music  Activity: Exercise.  LRT and patients went through a series of stretches to loosen up their muscles and relieve some stress from their bodies.  Education: Wellness, Dentist.   Education Outcome: Acknowledges education/In group clarification offered/Needs additional education.   Clinical Observations/Feedback:  Pt did do any of the stretches but sat and listened to the music as it played during the group session.     [name], LRT/CTRS     Ria Comment, Takiesha Mcdevitt A 11/05/2018 11:15 AM

## 2018-11-05 NOTE — Progress Notes (Addendum)
Pt up to the nursing station requesting a mask after seeing another pt wearing a mask. Pt stated she got her flu shot and it did not take. And she wanted a mask so she would not spread germs. Pt was informed that she was ok at this time . Pt also requesting mouthwash due to the toothpaste not cleaning thorough.

## 2018-11-05 NOTE — Progress Notes (Signed)
Nursing Progress Note: 7p-7a D: Pt currently presents with anxious/pleasant/animated affect and behavior. Pt states "Ask the doctor if I can leave the 14th. We having a party at my house, and I want to shake a tail feather. I can come back the 15th." Interacting appropriately with the milieu. Pt reports good sleep during the previous night with current medication regimen. Pt did attend wrap-up group.  A: Pt provided with medications per providers orders. Pt's labs and vitals were monitored throughout the night. Pt supported emotionally and encouraged to express concerns and questions. Pt educated on medications.  R: Pt's safety ensured with 15 minute and environmental checks. Pt currently denies SI, HI, and AVH. Pt verbally contracts to seek staff if SI,HI, or AVH occurs and to consult with staff before acting on any harmful thoughts. Will continue to monitor.

## 2018-11-06 LAB — T3, FREE: T3, Free: 3.8 pg/mL (ref 2.0–4.4)

## 2018-11-06 MED ORDER — ARIPIPRAZOLE ER 400 MG IM SRER
400.0000 mg | Freq: Once | INTRAMUSCULAR | Status: AC
  Administered 2018-11-06: 400 mg via INTRAMUSCULAR

## 2018-11-06 NOTE — Therapy (Signed)
Occupational Therapy Group Note  Date:  11/06/2018 Time:  11:26 AM  Group Topic/Focus:  Yoga  Participation Level:  Active  Participation Quality:  Appropriate  Affect:  Blunted  Cognitive:  Appropriate  Insight: Improving  Engagement in Group:  Engaged  Modes of Intervention:  Activity, Discussion, Education and Socialization  Additional Comments:    S: "These really are stretching me"  O: Education given on stress management and relaxation to develop coping skills when reintegrating into community. Healthy stress management strategies brainstormed within group, pt encouraged to contribute responses. Pt guided through relaxation chair yoga. Pt asked if pain a factor in mobility, adapted exercises to mett mobility needs and safety.  A: Pt presents to group, with blunted affect. Pt engaged in yoga sequence, following along and asking questions if completing poses correctly. Pt very pleasant and compliant. Notable increase of affect at end of session.  P: OT will continue to follow up for implementation of coping skills while pt acute  Zenovia Jarred, MSOT, OTR/L Behavioral Health OT/ Acute Relief OT PHP Office: [place] 11/06/2018, 11:26 AM

## 2018-11-06 NOTE — Progress Notes (Signed)
[facility] MD Progress Note  11/06/2018 9:43 AM Paula Dickson  MRN:  [id number] Subjective:   Patient is showing some improvement as far as her thought blocking she is more engaged and conversant. Patient seen for mental status exam and overall status check and also case discussed in team. With treatment team. Focused on leaving now she acknowledges she has been on long-acting injectable aripiprazole is willing to retake it and would like to resume it.  She understands the importance of compliance.  No EPS or TD.  No acute thoughts of harming self or others.  Again more organized only with detailed questioning does she lose her train of thought.  Speech normal rate and tone affect appropriate and pleasant mood euthymic  Principal Problem: Bipolar disorder, most recent episode depressed ([place]) Diagnosis: Principal Problem:   Bipolar disorder, most recent episode depressed ([place]) Active Problems:   Bipolar affective disorder, current episode mixed ([place])  Total Time spent with patient: 20 minutes  Past Medical History:  Past Medical History:  Diagnosis Date  . Bipolar affective disorder ([place])   . History of arthritis   . History of chicken pox   . History of depression   . History of genital warts   . history of heart murmur   . History of high blood pressure   . History of thyroid disease   . History of UTI   . Hypertension   . Low TSH level 07/13/2017  . Schizophrenia [facility])     Past Surgical History:  Procedure Laterality Date  . ABLATION ON ENDOMETRIOSIS    . CYST REMOVAL NECK     around 11 years ago /benign  . MULTIPLE TOOTH EXTRACTIONS     Family History:  Family History  Problem Relation Age of Onset  . Arthritis Father   . Hyperlipidemia Father   . High blood pressure Father   . Diabetes Sister   . Diabetes Mother   . Diabetes Brother   . Mental illness Brother   . Alcohol abuse Paternal Uncle   . Alcohol abuse Paternal Grandfather   . Breast cancer Maternal Aunt   .  Breast cancer Paternal Aunt   . High blood pressure Sister   . Mental illness Other        runs in family   Social History:  Social History   Substance and Sexual Activity  Alcohol Use No     Social History   Substance and Sexual Activity  Drug Use No    Social History   Socioeconomic History  . Marital status: Single    Spouse name: Not on file  . Number of children: Not on file  . Years of education: Not on file  . Highest education level: Not on file  Occupational History  . Not on file  Social Needs  . Financial resource strain: Not on file  . Food insecurity:    Worry: Not on file    Inability: Not on file  . Transportation needs:    Medical: Not on file    Non-medical: Not on file  Tobacco Use  . Smoking status: Never Smoker  . Smokeless tobacco: Never Used  Substance and Sexual Activity  . Alcohol use: No  . Drug use: No  . Sexual activity: Not Currently  Lifestyle  . Physical activity:    Days per week: Not on file    Minutes per session: Not on file  . Stress: Not on file  Relationships  . Social connections:  Talks on phone: Not on file    Gets together: Not on file    Attends religious service: Not on file    Active member of club or organization: Not on file    Attends meetings of clubs or organizations: Not on file    Relationship status: Not on file  Other Topics Concern  . Not on file  Social History Narrative  . Not on file   Additional Social History:                         Sleep: Good  Appetite:  Good  Current Medications: Current Facility-Administered Medications  Medication Dose Route Frequency Provider Last Rate Last Dose  . acetaminophen (TYLENOL) tablet 650 mg  650 mg Oral Q6H PRN Ethelene Hal, NP      . alum & mag hydroxide-simeth (MAALOX/MYLANTA) 200-200-20 MG/5ML suspension 30 mL  30 mL Oral Q4H PRN Ethelene Hal, NP      . amLODipine (NORVASC) tablet 5 mg  5 mg Oral Daily Ethelene Hal, NP   5 mg at 11/06/18 0745  . antiseptic oral rinse (BIOTENE) solution 15 mL  15 mL Mouth Rinse PRN Lindon Romp A, NP      . ARIPiprazole ER (ABILIFY MAINTENA) injection 400 mg  400 mg Intramuscular Once Johnn Hai, MD      . divalproex (DEPAKOTE ER) 24 hr tablet 250 mg  250 mg Oral QHS Johnn Hai, MD   250 mg at 11/05/18 2136  . hydrOXYzine (ATARAX/VISTARIL) tablet 25 mg  25 mg Oral TID PRN Ethelene Hal, NP   25 mg at 11/05/18 2136  . magnesium hydroxide (MILK OF MAGNESIA) suspension 30 mL  30 mL Oral Daily PRN Ethelene Hal, NP      . omega-3 acid ethyl esters (LOVAZA) capsule 1 g  1 g Oral BID Johnn Hai, MD   1 g at 11/06/18 0745  . prenatal multivitamin tablet 1 tablet  1 tablet Oral Q1200 Johnn Hai, MD   1 tablet at 11/05/18 1149  . propranolol ER (INDERAL LA) 24 hr capsule 60 mg  60 mg Oral Daily Ethelene Hal, NP   60 mg at 11/06/18 0745  . risperiDONE (RISPERDAL) tablet 2 mg  2 mg Oral BID Johnn Hai, MD   2 mg at 11/06/18 0745  . traZODone (DESYREL) tablet 150 mg  150 mg Oral QHS PRN Johnn Hai, MD   150 mg at 11/05/18 2136    Lab Results:  Results for orders placed or performed during the hospital encounter of 11/01/18 (from the past 48 hour(s))  T4, free     Status: None   Collection Time: 11/04/18  6:43 PM  Result Value Ref Range   Free T4 1.19 0.82 - 1.77 ng/dL    Comment: (NOTE) Biotin ingestion may interfere with free T4 tests. If the results are inconsistent with the TSH level, previous test results, or the clinical presentation, then consider biotin interference. If needed, order repeat testing after stopping biotin. Performed at [facility], [place] [address], [place], [place] [address]   T3, free     Status: None   Collection Time: 11/04/18  6:45 PM  Result Value Ref Range   T3, Free 3.8 2.0 - 4.4 pg/mL    Comment: (NOTE) Performed At: [facility] [address] [place], [place] [id number] Rush Farmer MD RA:[id number]   Urinalysis, Routine w reflex microscopic  Status: Abnormal   Collection Time: 11/05/18  6:39 PM  Result Value Ref Range   Color, Urine STRAW (A) YELLOW   APPearance CLEAR CLEAR   Specific Gravity, Urine 1.004 (L) 1.005 - 1.030   pH 6.0 5.0 - 8.0   Glucose, UA NEGATIVE NEGATIVE mg/dL   Hgb urine dipstick NEGATIVE NEGATIVE   Bilirubin Urine NEGATIVE NEGATIVE   Ketones, ur NEGATIVE NEGATIVE mg/dL   Protein, ur NEGATIVE NEGATIVE mg/dL   Nitrite NEGATIVE NEGATIVE   Leukocytes, UA TRACE (A) NEGATIVE   RBC / HPF 0-5 0 - 5 RBC/hpf   WBC, UA 0-5 0 - 5 WBC/hpf   Bacteria, UA RARE (A) NONE SEEN   Squamous Epithelial / LPF 0-5 0 - 5    Comment: Performed at [facility], [place] [address] [place], [place] [address]    Blood Alcohol level:  Lab Results  Component Value Date   [facility] <10 10/31/2018   ETH <5 61/95/0932    Metabolic Disorder Labs: Lab Results  Component Value Date   HGBA1C 5.4 11/03/2018   MPG 108.28 11/03/2018   No results found for: PROLACTIN Lab Results  Component Value Date   CHOL 150 11/03/2018   TRIG 164 (H) 11/03/2018   HDL 41 11/03/2018   CHOLHDL 3.7 11/03/2018   VLDL 33 11/03/2018   LDLCALC 76 11/03/2018   LDLCALC 84 12/10/2015    Physical Findings: AIMS: Facial and Oral Movements Muscles of Facial Expression: None, normal Lips and Perioral Area: None, normal Jaw: None, normal Tongue: None, normal,Extremity Movements Upper (arms, wrists, hands, fingers): None, normal Lower (legs, knees, ankles, toes): None, normal, Trunk Movements Neck, shoulders, hips: None, normal, Overall Severity Severity of abnormal movements (highest score from questions above): None, normal Incapacitation due to abnormal movements: None, normal Patient's awareness of abnormal movements (rate only patient's report): No Awareness, Dental Status Current problems with teeth and/or dentures?: No Does patient usually wear dentures?: No   CIWA:    COWS:     Musculoskeletal: Strength & Muscle Tone: within normal limits Gait & Station: normal Patient leans: N/A  Psychiatric Specialty Exam: Physical Exam no new findings  ROS no new complaints  Blood pressure (!) 134/97, pulse (!) 107, temperature 97.6 F (36.4 C), temperature source Oral, resp. rate 18, weight 68 kg, SpO2 100 %.Body mass index is 28.34 kg/m.  General Appearance: Casual  Eye Contact:  good  Speech:  Clear and Coherent  Volume:  Normal  Mood:  Euthymic  Affect:  Constricted  Thought Process:  Goal Directed  Orientation:  Full (Time, Place, and Person)  Thought Content:  Logical  Suicidal Thoughts:  No  Homicidal Thoughts:  No  Memory:  Immediate;   Good  Judgement:  Good  Insight:  Good  Psychomotor Activity:  Normal  Concentration:  Concentration: Good  Recall:  Good  Fund of Knowledge:  Good  Language:  Good  Akathisia:  Negative  Handed:  Right  AIMS (if indicated):     Assets:  Desire for Improvement  ADL's:  Intact  Cognition:  WNL  Sleep:  Number of Hours: 6.75   Continue current regimen Except add long-acting injectable aripiprazole to resume her baseline meds Probable discharge tomorrow   Treatment Plan Summary: Daily contact with patient to assess and evaluate symptoms and progress in treatment and Medication management  Macallan Ord, MD 11/06/2018, 9:43 AM

## 2018-11-06 NOTE — Plan of Care (Signed)
Progress note  D: pt found in the dayroom; compliant with medication administration. Pt states she slept well last night. Pt rates her depression/hopelessness/anxiety an 8/8/8 out of 10 respectively. Pt complains of lightheadedness and a runny nose, but denies any pain rating this a 0/10. Pt states her goal for today is to practice integrity and she will achieve this by being. Pt denies any si/hi/ah/vh and verbally agrees to approach staff if these become apparent or before harming herself while at [facility] A: pt provided support and encouragement. Pt given medication per protocol and standing orders. Q7m safety checks implemented and continued. R: pt safe on the unit. Will continue to monitor.   Pt progressing in the following metrics  Problem: Coping: Goal: Coping ability will improve Outcome: Progressing Goal: Will verbalize feelings Outcome: Progressing   Problem: Health Behavior/Discharge Planning: Goal: Compliance with prescribed medication regimen will improve Outcome: Progressing   Problem: Nutritional: Goal: Ability to achieve adequate nutritional intake will improve Outcome: Progressing

## 2018-11-06 NOTE — Progress Notes (Signed)
Recreation Therapy Notes  Date: 12.11.19 Time: 1000 Location: 500 Hall Dayroom   Group Topic: Communication, Team Building, Problem Solving  Goal Area(s) Addresses:  Patient will effectively work with peer towards shared goal.  Patient will identify skill used to make activity successful.  Patient will identify how skills used during activity can be used to reach post d/c goals.   Behavioral Response: Engaged  Intervention: STEM Activity   Activity: Elevated Bridge. Patients were provided the following materials: 20 straws and a long piece of masking tape.  Patients were divided into teams of 2.  Each group was to build an elevated that would hold a small paperback book.  Education: Education officer, community, Dentist.   Education Outcome: Acknowledges education/In group clarification offered/Needs additional education.   Clinical Observations/Feedback: Pt was engaged and able to work well with peers.  Pt stated the group had to use communication and teamwork to complete activity.  Pt expressed if the plan doesn't work, you have to create a new one.  Pt was pleasant throughout group.    [name], LRT/CTRS    Ria Comment, Verdie Wilms A 11/06/2018 11:25 AM

## 2018-11-06 NOTE — BHH Group Notes (Signed)
[place] Group Notes:  (Nursing/MHT/Case Management/Adjunct)  Date:  11/06/2018  Time:  1:15 PM  Type of Therapy:  Nurse Education  Participation Level:  Did Not Attend  Baron Sane 11/06/2018, 4:01 PM

## 2018-11-06 NOTE — Progress Notes (Signed)
Nursing Progress Note: 7p-7a D: Pt currently presents with a anxious/pleasant/preoccupiied/thought blocking affect and behavior. Pt states "I am having some hesitation about taking meds. I don't know what to do." Interacting appropriately with the milieu. Pt reports good sleep during the previous night with current medication regimen. Pt did attend wrap-up group.  A: Pt provided with medications per providers orders. Pt's labs and vitals were monitored throughout the night. Pt supported emotionally and encouraged to express concerns and questions. Pt educated on medications.  R: Pt's safety ensured with 15 minute and environmental checks. Pt currently denies SI, HI, and AVH. Pt verbally contracts to seek staff if SI,HI, or AVH occurs and to consult with staff before acting on any harmful thoughts. Will continue to monitor.

## 2018-11-06 NOTE — Progress Notes (Signed)
Adult Psychoeducational Group Note  Date:  11/06/2018 Time:  9:11 PM  Group Topic/Focus:  Wrap-Up Group:   The focus of this group is to help patients review their daily goal of treatment and discuss progress on daily workbooks.  Participation Level:  Active  Participation Quality:  Appropriate  Affect:  Appropriate  Cognitive:  Alert  Insight: Appropriate  Engagement in Group:  Engaged  Modes of Intervention:  Discussion  Additional Comments:  Pt stated that today was good. Her goal is to continue to sort through her thoughts and to do the right things.   Wynelle Fanny R 11/06/2018, 9:11 PM

## 2018-11-07 LAB — URINE CULTURE: Culture: 80000 — AB

## 2018-11-07 MED ORDER — RISPERIDONE 2 MG PO TABS
4.0000 mg | ORAL_TABLET | Freq: Every day | ORAL | Status: DC
  Administered 2018-11-07 – 2018-11-10 (×4): 4 mg via ORAL
  Filled 2018-11-07 (×5): qty 2

## 2018-11-07 MED ORDER — VORTIOXETINE HBR 10 MG PO TABS
10.0000 mg | ORAL_TABLET | Freq: Every day | ORAL | Status: DC
  Administered 2018-11-07 – 2018-11-13 (×7): 10 mg via ORAL
  Filled 2018-11-07 (×9): qty 1

## 2018-11-07 MED ORDER — RISPERIDONE 2 MG PO TABS
2.0000 mg | ORAL_TABLET | Freq: Every day | ORAL | Status: DC
  Administered 2018-11-08 – 2018-11-11 (×4): 2 mg via ORAL
  Filled 2018-11-07 (×5): qty 1

## 2018-11-07 NOTE — Progress Notes (Signed)
Nursing Progress Note: 7p-7a D: Pt currently presents with a anxious/pleasant/preoccupiied/thought blocking affect and behavior. Pt states "I am doing well today." Interacting appropriately with the milieu. Pt reports good sleep during the previous night with current medication regimen. Pt did attend wrap-up group.  A: Pt provided with medications per providers orders. Pt's labs and vitals were monitored throughout the night. Pt supported emotionally and encouraged to express concerns and questions. Pt educated on medications.  R: Pt's safety ensured with 15 minute and environmental checks. Pt currently denies SI, HI, and AVH. Pt verbally contracts to seek staff if SI,HI, or AVH occurs and to consult with staff before acting on any harmful thoughts. Will continue to monitor.

## 2018-11-07 NOTE — Progress Notes (Signed)
Recreation Therapy Notes  12.12.19 1115:  LRT met with patient to go over anger management.  Pt identified her triggers for anger were "being ungrateful, hatred in my heart I become a b word over a period of time and I speak truth".  Pt expressed when she is angry, she shouts, has no boundaries, becomes physically aggressive, argues and has no respect for others.  Pt stated her coping skill for anger was "dealing" with it.  Pt seemed confused when LRT went over the instructions with her and appeared to be thought blocking.  Pt was pleasant and engaged.    [name], LRT/CTRS    [name] A 11/07/2018 12:10 PM

## 2018-11-07 NOTE — BHH Group Notes (Signed)
[place] Group Notes:  (Nursing/MHT/Case Management/Adjunct)  Date:  11/07/2018  Time:  9:42 PM  Type of Therapy:  Wrap up group  Participation Level:  Active  Participation Quality:  Sharing  Affect:  Flat  Cognitive:  Confused  Insight:  Limited  Engagement in Group:  Engaged  Modes of Intervention:  Discussion and Support  Summary of Progress/Problems:  Paula Dickson was pleasant during group.  She was minimal with conversation but was attentive.  She didn't reach her goal of "participating more in groups."  She reported her over all day was "OK."  Windell Moment 11/07/2018, 9:42 PM

## 2018-11-07 NOTE — Progress Notes (Signed)
Patient has noted to be pacing, tremulous and talking to self.  Patient denies SI, HI and AVH this shift and has had no incidents of behavioral dyscontrol.     Assess patient for safety, offer medications as prescribed, engage patient in 1:1 staff talks.   Continue to monitor as planned patient able to contract for safety.

## 2018-11-07 NOTE — Progress Notes (Signed)
[facility] MD Progress Note  11/07/2018 12:37 PM Paula Dickson  MRN:  [id number] Subjective:   Patient has regressed somewhat she is having a little more thought blocking today this seems to correlate with a reduction in the risperidone dosing. She is alert and oriented to person place and situation but slow to respond and does know the exact date. No reports of hallucinations denies wanting to harm self or others No EPS or TD Principal Problem: Bipolar disorder, most recent episode depressed ([place]) Diagnosis: Principal Problem:   Bipolar disorder, most recent episode depressed ([place]) Active Problems:   Bipolar affective disorder, current episode mixed ([place])  Total Time spent with patient: 20 minutes  Past Medical History:  Past Medical History:  Diagnosis Date  . Bipolar affective disorder ([place])   . History of arthritis   . History of chicken pox   . History of depression   . History of genital warts   . history of heart murmur   . History of high blood pressure   . History of thyroid disease   . History of UTI   . Hypertension   . Low TSH level 07/13/2017  . Schizophrenia [facility])     Past Surgical History:  Procedure Laterality Date  . ABLATION ON ENDOMETRIOSIS    . CYST REMOVAL NECK     around 11 years ago /benign  . MULTIPLE TOOTH EXTRACTIONS     Family History:  Family History  Problem Relation Age of Onset  . Arthritis Father   . Hyperlipidemia Father   . High blood pressure Father   . Diabetes Sister   . Diabetes Mother   . Diabetes Brother   . Mental illness Brother   . Alcohol abuse Paternal Uncle   . Alcohol abuse Paternal Grandfather   . Breast cancer Maternal Aunt   . Breast cancer Paternal Aunt   . High blood pressure Sister   . Mental illness Other        runs in family    Social History:  Social History   Substance and Sexual Activity  Alcohol Use No     Social History   Substance and Sexual Activity  Drug Use No    Social History    Socioeconomic History  . Marital status: Single    Spouse name: Not on file  . Number of children: Not on file  . Years of education: Not on file  . Highest education level: Not on file  Occupational History  . Not on file  Social Needs  . Financial resource strain: Not on file  . Food insecurity:    Worry: Not on file    Inability: Not on file  . Transportation needs:    Medical: Not on file    Non-medical: Not on file  Tobacco Use  . Smoking status: Never Smoker  . Smokeless tobacco: Never Used  Substance and Sexual Activity  . Alcohol use: No  . Drug use: No  . Sexual activity: Not Currently  Lifestyle  . Physical activity:    Days per week: Not on file    Minutes per session: Not on file  . Stress: Not on file  Relationships  . Social connections:    Talks on phone: Not on file    Gets together: Not on file    Attends religious service: Not on file    Active member of club or organization: Not on file    Attends meetings of clubs or organizations: Not on file  Relationship status: Not on file  Other Topics Concern  . Not on file  Social History Narrative  . Not on file   Additional Social History:                         Sleep: Fair  Appetite:  Good  Current Medications: Current Facility-Administered Medications  Medication Dose Route Frequency Provider Last Rate Last Dose  . acetaminophen (TYLENOL) tablet 650 mg  650 mg Oral Q6H PRN Ethelene Hal, NP      . alum & mag hydroxide-simeth (MAALOX/MYLANTA) 200-200-20 MG/5ML suspension 30 mL  30 mL Oral Q4H PRN Ethelene Hal, NP      . amLODipine (NORVASC) tablet 5 mg  5 mg Oral Daily Ethelene Hal, NP   5 mg at 11/07/18 0739  . antiseptic oral rinse (BIOTENE) solution 15 mL  15 mL Mouth Rinse PRN Lindon Romp A, NP      . divalproex (DEPAKOTE ER) 24 hr tablet 250 mg  250 mg Oral QHS Johnn Hai, MD   250 mg at 11/06/18 2121  . hydrOXYzine (ATARAX/VISTARIL) tablet 25 mg   25 mg Oral TID PRN Ethelene Hal, NP   25 mg at 11/05/18 2136  . magnesium hydroxide (MILK OF MAGNESIA) suspension 30 mL  30 mL Oral Daily PRN Ethelene Hal, NP      . omega-3 acid ethyl esters (LOVAZA) capsule 1 g  1 g Oral BID Johnn Hai, MD   1 g at 11/07/18 0739  . prenatal multivitamin tablet 1 tablet  1 tablet Oral Q1200 Johnn Hai, MD   1 tablet at 11/06/18 1158  . propranolol ER (INDERAL LA) 24 hr capsule 60 mg  60 mg Oral Daily Ethelene Hal, NP   60 mg at 11/07/18 0739  . [START ON 11/08/2018] risperiDONE (RISPERDAL) tablet 2 mg  2 mg Oral Daily Johnn Hai, MD      . risperiDONE (RISPERDAL) tablet 4 mg  4 mg Oral QHS Johnn Hai, MD      . traZODone (DESYREL) tablet 150 mg  150 mg Oral QHS PRN Johnn Hai, MD   150 mg at 11/06/18 2122    Lab Results:  Results for orders placed or performed during the hospital encounter of 11/01/18 (from the past 48 hour(s))  Urinalysis, Routine w reflex microscopic     Status: Abnormal   Collection Time: 11/05/18  6:39 PM  Result Value Ref Range   Color, Urine STRAW (A) YELLOW   APPearance CLEAR CLEAR   Specific Gravity, Urine 1.004 (L) 1.005 - 1.030   pH 6.0 5.0 - 8.0   Glucose, UA NEGATIVE NEGATIVE mg/dL   Hgb urine dipstick NEGATIVE NEGATIVE   Bilirubin Urine NEGATIVE NEGATIVE   Ketones, ur NEGATIVE NEGATIVE mg/dL   Protein, ur NEGATIVE NEGATIVE mg/dL   Nitrite NEGATIVE NEGATIVE   Leukocytes, UA TRACE (A) NEGATIVE   RBC / HPF 0-5 0 - 5 RBC/hpf   WBC, UA 0-5 0 - 5 WBC/hpf   Bacteria, UA RARE (A) NONE SEEN   Squamous Epithelial / LPF 0-5 0 - 5    Comment: Performed at [facility], [place] [address], [place], [place] [address]  Urine Culture     Status: Abnormal   Collection Time: 11/05/18  6:39 PM  Result Value Ref Range   Specimen Description      URINE, CLEAN CATCH Performed at [facility], [place] [address]  [place], [place] [address]    Special Requests       NONE Performed at [facility], [place] [address], [place], [place] [address]    Culture (A)     80,000 COLONIES/mL GROUP B STREP(S.AGALACTIAE)ISOLATED TESTING AGAINST S. AGALACTIAE NOT ROUTINELY PERFORMED DUE TO PREDICTABILITY OF AMP/PEN/VAN SUSCEPTIBILITY. Performed at [facility], [place] [address], [place], [place] [address]    Report Status 11/07/2018 FINAL     Blood Alcohol level:  Lab Results  Component Value Date   ETH <10 10/31/2018   ETH <5 37/62/8315    Metabolic Disorder Labs: Lab Results  Component Value Date   HGBA1C 5.4 11/03/2018   MPG 108.28 11/03/2018   No results found for: PROLACTIN Lab Results  Component Value Date   CHOL 150 11/03/2018   TRIG 164 (H) 11/03/2018   HDL 41 11/03/2018   CHOLHDL 3.7 11/03/2018   VLDL 33 11/03/2018   LDLCALC 76 11/03/2018   LDLCALC 84 12/10/2015    Physical Findings: AIMS: Facial and Oral Movements Muscles of Facial Expression: None, normal Lips and Perioral Area: None, normal Jaw: None, normal Tongue: None, normal,Extremity Movements Upper (arms, wrists, hands, fingers): None, normal Lower (legs, knees, ankles, toes): None, normal, Trunk Movements Neck, shoulders, hips: None, normal, Overall Severity Severity of abnormal movements (highest score from questions above): None, normal Incapacitation due to abnormal movements: None, normal Patient's awareness of abnormal movements (rate only patient's report): No Awareness, Dental Status Current problems with teeth and/or dentures?: No Does patient usually wear dentures?: No  CIWA:    COWS:     Musculoskeletal: Strength & Muscle Tone: within normal limits Gait & Station: normal Patient leans: N/A  Psychiatric Specialty Exam: Physical Exam  ROS  Blood pressure (!) 127/92, pulse 98, temperature 98.8 F (37.1 C), temperature source Oral, resp. rate 16, weight 68 kg, SpO2 100 %.Body mass index is 28.34 kg/m.  General Appearance: Casual  Eye  Contact:  Good  Speech:  Clear and Coherent  Volume:  Decreased  Mood:  Dysphoric  Affect:  Congruent  Thought Process:  Goal Directed  Orientation:  Full (Time, Place, and Person)  Thought Content:  Slowness to process versus blocking  Suicidal Thoughts:  No  Homicidal Thoughts:  No  Memory:  Immediate;   Poor  Judgement:  Good  Insight:  Good  Psychomotor Activity:  Decreased  Concentration:  Concentration: Fair  Recall:  [place] of Knowledge:  Good  Language:  Good  Akathisia:  Negative  Handed:  Right  AIMS (if indicated):     Assets:  Communication Skills Desire for Improvement  ADL's:  Intact  Cognition:  WNL  Sleep:  Number of Hours: 5     Treatment Plan Summary: Daily contact with patient to assess and evaluate symptoms and progress in treatment, Medication management and Plan Further for thought blocking and psychotic symptoms escalate Risperdal, continue reality-based therapy continue 15-minute checks.  For affective symptoms continue cognitive therapy and antidepressant therapy  Johnn Hai, MD 11/07/2018, 12:37 PM

## 2018-11-08 MED ORDER — PROPRANOLOL HCL ER 80 MG PO CP24
80.0000 mg | ORAL_CAPSULE | Freq: Every day | ORAL | Status: DC
  Administered 2018-11-09 – 2018-11-18 (×10): 80 mg via ORAL
  Filled 2018-11-08 (×12): qty 1

## 2018-11-08 NOTE — Progress Notes (Signed)
Patient requested to speak with CSW after group regarding her IVC and discharge planning. CSW shared that patient may be able to discharge over the weekend or early next week. CSW asked patient how she felt about discharge.   Patient appears anxious, visibly trembling. Patient experiencing thought blocking and having difficulty explaining her concerns. Patient is afraid she "might not have a place to go," shares she is afraid her father died and presumably her sister may not let her come home. CSW asked why patient thinks her father may have died, if any family has called with health updates, or if she has a "gut-feeling" patient unsure. CSW asked patient if she has spoken to her sister since being admitted to [facility]. Patient says she called once, sister answered and said "you're not supposed to be on the phone."  Patient is afraid to call sister again. CSW offered to call sister with patient to ask how the patient's father is doing/how things are at home and discuss discharge. CSW and patient made two attempts from psychiatrists office, 2:30pm and 3:30pm. CSW left voicemails each time with patient's permission.  CSW asked patient if CSW can do anything else to put patient's mind at ease. Patient unsure. CSW encouraged patient to get some rest and try to contact her sister over the weekend.  Stephanie Acre, LCSW-A Clinical Social Worker

## 2018-11-08 NOTE — Progress Notes (Signed)
D: Pt denies SI/HI/AVH. Pt is pleasant and cooperative. Pt presented visually worried and scared about her situation" I feel safe/ don't feel safe" pt presented confused at times, but pt was Ox3 . Pt very paranoid and delusional at times. Pt continues to present with slow responses, pt appears to be afraid to respond certain ways for fear of retaliation.   A: Pt was offered support and encouragement. Pt was given scheduled medication. Pt was encourage to attend groups. Q 15 minute checks were done for safety.   R:Pt attends groups and interacts well with peers and staff. Pt is taking medication. Pt has no complaints.Pt receptive to treatment and safety maintained on unit.

## 2018-11-08 NOTE — Progress Notes (Signed)
Recreation Therapy Notes  Date: 12.13.19 Time: 1000 Location: 500 Hall Dayroom  Group Topic: Movie  Goal Area(s) Addresses:  Patient will identify some of the struggles of the characters of the movie. Patient will identify some of the coping skills used by the characters in the movie.  Behavioral Response: Engaged  Intervention: Therapeutic Movie  Activity: [place]. LRT played a movie for the patients that dealt with loneliness, acceptance, depression and loss of loved ones.  Patients were to identify how the characters in the movie coped with the issues they were dealing with and how those situations shaped the characters.  Education: Discharge Planning.   Education Outcome: Acknowledges education/In group clarification offered/Needs additional education.   Clinical Observations/Feedback:  Pt was attentive to the movie.  Pt was able to focus and be attentive during group.    [name], LRT/CTRS      [name] A 11/08/2018 11:57 AM

## 2018-11-08 NOTE — BHH Group Notes (Signed)
[place] Group Therapy  11/08/2018   Type of Therapy:  Group Therapy  Participation Level:  Active  Participation Quality:  Appropriate  Affect:  Appropriate  Cognitive:  Alert  Insight: Limited  Engagement in Therapy:  Engaged  Modes of Intervention:  Discussion and Support  Summary of Progress/Problems: Patient attended a spiritual care group with Chaplain regarding the meaning of and importance of "hope" in their life. Patient defined hope as "hope is in my heart. It's lifted up."  Patient demonstrated significant thought blocking, she started speaking several times only to go silent or move her lips.   Joellen Jersey 11/08/2018, 3:28 PM

## 2018-11-08 NOTE — Tx Team (Signed)
Interdisciplinary Treatment and Diagnostic Plan Update  11/08/2018 Time of Session: 9:00am Paula Dickson MRN: [id number]  Principal Diagnosis: Bipolar disorder, most recent episode depressed ([place])  Secondary Diagnoses: Principal Problem:   Bipolar disorder, most recent episode depressed ([place]) Active Problems:   Bipolar affective disorder, current episode mixed ([place])   Current Medications:  Current Facility-Administered Medications  Medication Dose Route Frequency Provider Last Rate Last Dose  . acetaminophen (TYLENOL) tablet 650 mg  650 mg Oral Q6H PRN Ethelene Hal, NP      . alum & mag hydroxide-simeth (MAALOX/MYLANTA) 200-200-20 MG/5ML suspension 30 mL  30 mL Oral Q4H PRN Ethelene Hal, NP      . amLODipine (NORVASC) tablet 5 mg  5 mg Oral Daily Ethelene Hal, NP   5 mg at 11/08/18 0834  . antiseptic oral rinse (BIOTENE) solution 15 mL  15 mL Mouth Rinse PRN Lindon Romp A, NP      . hydrOXYzine (ATARAX/VISTARIL) tablet 25 mg  25 mg Oral TID PRN Ethelene Hal, NP   25 mg at 11/05/18 2136  . magnesium hydroxide (MILK OF MAGNESIA) suspension 30 mL  30 mL Oral Daily PRN Ethelene Hal, NP      . omega-3 acid ethyl esters (LOVAZA) capsule 1 g  1 g Oral BID Johnn Hai, MD   1 g at 11/08/18 0834  . prenatal multivitamin tablet 1 tablet  1 tablet Oral Q1200 Johnn Hai, MD   1 tablet at 11/07/18 1354  . [START ON 11/09/2018] propranolol ER (INDERAL LA) 24 hr capsule 80 mg  80 mg Oral Daily Johnn Hai, MD      . risperiDONE (RISPERDAL) tablet 2 mg  2 mg Oral Daily Johnn Hai, MD   2 mg at 11/08/18 0834  . risperiDONE (RISPERDAL) tablet 4 mg  4 mg Oral QHS Johnn Hai, MD   4 mg at 11/07/18 2158  . traZODone (DESYREL) tablet 150 mg  150 mg Oral QHS PRN Johnn Hai, MD   150 mg at 11/07/18 2157  . vortioxetine HBr (TRINTELLIX) tablet 10 mg  10 mg Oral Daily Johnn Hai, MD   10 mg at 11/08/18 9381   PTA Medications: Medications Prior to  Admission  Medication Sig Dispense Refill Last Dose  . ABILIFY MAINTENA 400 MG SRER injection Inject 400 mg into the muscle every 30 (thirty) days. Last 10/03/2018  2 Past Month at Unknown time  . amLODipine (NORVASC) 5 MG tablet TAKE 1 TABLET BY MOUTH EVERY DAY (Patient taking differently: Take 5 mg by mouth daily. ) 30 tablet 6 10/31/2018 at Unknown time  . benztropine (COGENTIN) 0.5 MG tablet Take 1 tablet (0.5 mg total) by mouth at bedtime. 30 tablet 0 10/30/2018 at Unknown time  . chlorhexidine (PERIDEX) 0.12 % solution 15 mLs by Mouth Rinse route 2 (two) times daily as needed (mouth pain). Rinse and spit  3 10/31/2018 at Unknown time  . divalproex (DEPAKOTE ER) 500 MG 24 hr tablet Take 1,000 mg by mouth at bedtime.  1 10/30/2018 at Unknown time  . divalproex (DEPAKOTE SPRINKLE) 125 MG capsule Take 1 capsule (125 mg total) by mouth 2 (two) times daily. (Patient not taking: Reported on 08/11/2018)   Not Taking at Unknown time  . lithium carbonate (ESKALITH) 450 MG CR tablet Take 1 tablet (450 mg total) by mouth every 12 (twelve) hours. (Patient not taking: Reported on 07/11/2017) 60 tablet 0 Not Taking at Unknown time  . Multiple Vitamins-Minerals (MULTIVITAMIN ADULT) TABS Take  1 tablet by mouth daily.    10/31/2018 at Unknown time  . OLANZapine (ZYPREXA) 20 MG tablet Take 20 mg by mouth at bedtime.   2 10/30/2018 at Unknown time  . PREVIDENT 5000 BOOSTER PLUS 1.1 % PSTE Take 1 application by mouth 2 (two) times daily.  2 10/31/2018 at Unknown time  . propranolol (INDERAL) 20 MG tablet Take 1 tablet (20 mg total) by mouth 3 (three) times daily. (Patient not taking: Reported on 10/31/2018) 90 tablet 0 Not Taking at Unknown time  . propranolol ER (INDERAL LA) 60 MG 24 hr capsule Take 60 mg by mouth daily.  2 10/31/2018 at 1030  . QUEtiapine (SEROQUEL XR) 300 MG 24 hr tablet Take 2 tablets (600 mg total) by mouth at bedtime. (Patient not taking: Reported on 07/11/2017) 60 tablet 0 Not Taking at Unknown time     Patient Stressors:  Aging father, health issues  Patient Strengths:  Strong communicator at baseline  Treatment Modalities: Medication Management, Group therapy, Case management,  1 to 1 session with clinician, Psychoeducation, Recreational therapy.   Physician Treatment Plan for Primary Diagnosis: Bipolar disorder, most recent episode depressed ([place])  Medication Management: Evaluate patient's response, side effects, and tolerance of medication regimen.  Therapeutic Interventions: 1 to 1 sessions, Unit Group sessions and Medication administration.  Evaluation of Outcomes: Progressing  Physician Treatment Plan for Secondary Diagnosis: Principal Problem:   Bipolar disorder, most recent episode depressed ([place]) Active Problems:   Bipolar affective disorder, current episode mixed ([place])   Medication Management: Evaluate patient's response, side effects, and tolerance of medication regimen.  Therapeutic Interventions: 1 to 1 sessions, Unit Group sessions and Medication administration.  Evaluation of Outcomes: Progressing   RN Treatment Plan for Primary Diagnosis: Bipolar disorder, most recent episode depressed ([place]) Long Term Goal(s): Knowledge of disease and therapeutic regimen to maintain health will improve  Short Term Goals: Ability to verbalize frustration and anger appropriately will improve, Ability to demonstrate self-control, Ability to participate in decision making will improve, Ability to verbalize feelings will improve and Compliance with prescribed medications will improve  Medication Management: RN will administer medications as ordered by provider, will assess and evaluate patient's response and provide education to patient for prescribed medication. RN will report any adverse and/or side effects to prescribing provider.  Therapeutic Interventions: 1 on 1 counseling sessions, Psychoeducation, Medication administration, Evaluate responses to treatment, Monitor vital  signs and CBGs as ordered, Perform/monitor CIWA, COWS, AIMS and Fall Risk screenings as ordered, Perform wound care treatments as ordered.  Evaluation of Outcomes: Progressing   LCSW Treatment Plan for Primary Diagnosis: Bipolar disorder, most recent episode depressed ([place]) Long Term Goal(s): Safe transition to appropriate next level of care at discharge, Engage patient in therapeutic group addressing interpersonal concerns.  Short Term Goals: Engage patient in aftercare planning with referrals and resources, Increase social support, Increase ability to appropriately verbalize feelings, Increase emotional regulation and Increase skills for wellness and recovery  Therapeutic Interventions: Assess for all discharge needs, 1 to 1 time with Social worker, Explore available resources and support systems, Assess for adequacy in community support network, Educate family and significant other(s) on suicide prevention, Complete Psychosocial Assessment, Interpersonal group therapy.  Evaluation of Outcomes: Progressing   Progress in Treatment: Attending groups: Yes. Participating in groups: Yes. Taking medication as prescribed: Yes. Toleration medication: Yes. Family/Significant other contact made: Yes, individual(s) contacted:  sister, [name] Patient understands diagnosis: Yes. Discussing patient identified problems/goals with staff: Yes. Medical problems stabilized or resolved: No.  Denies suicidal/homicidal ideation: Yes. Issues/concerns per patient self-inventory: Yes.  New problem(s) identified: Yes, Describe:  patient still experiencing thought blocking, further medication adjustments required  New Short Term/Long Term Goal(s): medication management for mood stabilization; elimination of SI thoughts; development of comprehensive mental wellness/sobriety plan.  Patient Goals:  "To work on some things. I know what I need to work on."  Discharge Plan or Barriers: Likely discharging home with  family. [place] pamphlet, Mobile Crisis information, and information provided to patient for additional community support and resources.   Reason for Continuation of Hospitalization: Anxiety Depression Medication stabilization Other; describe Thought blocking  Estimated Length of Stay: 3-5 days  Attendees: Patient: Paula Dickson 11/08/2018 9:21 AM  Physician: Dr.Farah 11/08/2018 9:21 AM  Nursing: Yetta Flock 11/08/2018 9:21 AM  RN Care Manager: 11/08/2018 9:21 AM  Social Worker: Stephanie Acre, [place] 11/08/2018 9:21 AM  Recreational Therapist:  11/08/2018 9:21 AM  Other:  11/08/2018 9:21 AM  Other:  11/08/2018 9:21 AM  Other: 11/08/2018 9:21 AM    Scribe for Treatment Team: Joellen Jersey, [place] 11/08/2018 9:21 AM

## 2018-11-08 NOTE — Progress Notes (Signed)
[facility] MD Progress Note  11/08/2018 8:11 AM Paula Dickson  MRN:  [id number] Subjective:   Paula Dickson has continued to display thought blocking and lack of full orientation today. She is alert oriented to person place situation when asked the day she is unaware of that she thinks it is "the 23rd" but cannot name the day of the week or the month.  Knows the year Pauses a long time due to thought blocking and loses track of some questions Denies hallucinations or wanting to harm self or others no mania or hypomania  Principal Problem: Bipolar disorder, most recent episode depressed ([place]) Diagnosis: Principal Problem:   Bipolar disorder, most recent episode depressed ([place]) Active Problems:   Bipolar affective disorder, current episode mixed ([place])  Total Time spent with patient: 20 minutes   Past Medical History:  Past Medical History:  Diagnosis Date  . Bipolar affective disorder ([place])   . History of arthritis   . History of chicken pox   . History of depression   . History of genital warts   . history of heart murmur   . History of high blood pressure   . History of thyroid disease   . History of UTI   . Hypertension   . Low TSH level 07/13/2017  . Schizophrenia [facility])     Past Surgical History:  Procedure Laterality Date  . ABLATION ON ENDOMETRIOSIS    . CYST REMOVAL NECK     around 11 years ago /benign  . MULTIPLE TOOTH EXTRACTIONS     Family History:  Family History  Problem Relation Age of Onset  . Arthritis Father   . Hyperlipidemia Father   . High blood pressure Father   . Diabetes Sister   . Diabetes Mother   . Diabetes Brother   . Mental illness Brother   . Alcohol abuse Paternal Uncle   . Alcohol abuse Paternal Grandfather   . Breast cancer Maternal Aunt   . Breast cancer Paternal Aunt   . High blood pressure Sister   . Mental illness Other        runs in family    Social History:  Social History   Substance and Sexual Activity  Alcohol Use No      Social History   Substance and Sexual Activity  Drug Use No    Social History   Socioeconomic History  . Marital status: Single    Spouse name: Not on file  . Number of children: Not on file  . Years of education: Not on file  . Highest education level: Not on file  Occupational History  . Not on file  Social Needs  . Financial resource strain: Not on file  . Food insecurity:    Worry: Not on file    Inability: Not on file  . Transportation needs:    Medical: Not on file    Non-medical: Not on file  Tobacco Use  . Smoking status: Never Smoker  . Smokeless tobacco: Never Used  Substance and Sexual Activity  . Alcohol use: No  . Drug use: No  . Sexual activity: Not Currently  Lifestyle  . Physical activity:    Days per week: Not on file    Minutes per session: Not on file  . Stress: Not on file  Relationships  . Social connections:    Talks on phone: Not on file    Gets together: Not on file    Attends religious service: Not on file  Active member of club or organization: Not on file    Attends meetings of clubs or organizations: Not on file    Relationship status: Not on file  Other Topics Concern  . Not on file  Social History Narrative  . Not on file   Sleep: Good  Appetite:  Good  Current Medications: Current Facility-Administered Medications  Medication Dose Route Frequency Provider Last Rate Last Dose  . acetaminophen (TYLENOL) tablet 650 mg  650 mg Oral Q6H PRN Ethelene Hal, NP      . alum & mag hydroxide-simeth (MAALOX/MYLANTA) 200-200-20 MG/5ML suspension 30 mL  30 mL Oral Q4H PRN Ethelene Hal, NP      . amLODipine (NORVASC) tablet 5 mg  5 mg Oral Daily Ethelene Hal, NP   5 mg at 11/07/18 0739  . antiseptic oral rinse (BIOTENE) solution 15 mL  15 mL Mouth Rinse PRN Lindon Romp A, NP      . hydrOXYzine (ATARAX/VISTARIL) tablet 25 mg  25 mg Oral TID PRN Ethelene Hal, NP   25 mg at 11/05/18 2136  . magnesium  hydroxide (MILK OF MAGNESIA) suspension 30 mL  30 mL Oral Daily PRN Ethelene Hal, NP      . omega-3 acid ethyl esters (LOVAZA) capsule 1 g  1 g Oral BID Johnn Hai, MD   1 g at 11/07/18 1656  . prenatal multivitamin tablet 1 tablet  1 tablet Oral Q1200 Johnn Hai, MD   1 tablet at 11/07/18 1354  . [START ON 11/09/2018] propranolol ER (INDERAL LA) 24 hr capsule 80 mg  80 mg Oral Daily Johnn Hai, MD      . risperiDONE (RISPERDAL) tablet 2 mg  2 mg Oral Daily Johnn Hai, MD      . risperiDONE (RISPERDAL) tablet 4 mg  4 mg Oral QHS Johnn Hai, MD   4 mg at 11/07/18 2158  . traZODone (DESYREL) tablet 150 mg  150 mg Oral QHS PRN Johnn Hai, MD   150 mg at 11/07/18 2157  . vortioxetine HBr (TRINTELLIX) tablet 10 mg  10 mg Oral Daily Johnn Hai, MD   10 mg at 11/07/18 1354    Lab Results: No results found for this or any previous visit (from the past 48 hour(s)).  Blood Alcohol level:  Lab Results  Component Value Date   ETH <10 10/31/2018   ETH <5 35/00/9381    Metabolic Disorder Labs: Lab Results  Component Value Date   HGBA1C 5.4 11/03/2018   MPG 108.28 11/03/2018   No results found for: PROLACTIN Lab Results  Component Value Date   CHOL 150 11/03/2018   TRIG 164 (H) 11/03/2018   HDL 41 11/03/2018   CHOLHDL 3.7 11/03/2018   VLDL 33 11/03/2018   LDLCALC 76 11/03/2018   LDLCALC 84 12/10/2015    Physical Findings: AIMS: Facial and Oral Movements Muscles of Facial Expression: None, normal Lips and Perioral Area: None, normal Jaw: None, normal Tongue: None, normal,Extremity Movements Upper (arms, wrists, hands, fingers): None, normal Lower (legs, knees, ankles, toes): None, normal, Trunk Movements Neck, shoulders, hips: None, normal, Overall Severity Severity of abnormal movements (highest score from questions above): None, normal Incapacitation due to abnormal movements: None, normal Patient's awareness of abnormal movements (rate only patient's report):  No Awareness, Dental Status Current problems with teeth and/or dentures?: No Does patient usually wear dentures?: No  CIWA:    COWS:     Musculoskeletal: Strength & Muscle Tone: within normal limits Gait &  Station: normal Patient leans: N/A  Psychiatric Specialty Exam: Physical Exam  ROS  Blood pressure (!) 127/92, pulse 98, temperature 98.8 F (37.1 C), temperature source Oral, resp. rate 16, weight 68 kg, SpO2 100 %.Body mass index is 28.34 kg/m.  General Appearance: Casual  Eye Contact:  Fair  Speech:  Slow  Volume:  Decreased  Mood:  Dysphoric  Affect:  Flat  Thought Process:  Disorganized  Orientation:  Other:  Blocking and poverty of content to some degree  Thought Content:  As above oriented to person place general situation but some thought blocking and poverty of content  Suicidal Thoughts:  No  Homicidal Thoughts:  No  Memory:  Recent;   Poor  Judgement:  Intact  Insight:  Fair  Psychomotor Activity:  Decreased  Concentration:  Concentration: Poor  Recall:  Poor  Fund of Knowledge:  Poor  Language:  Fair  Akathisia:  Negative  Handed:  Right  AIMS (if indicated):     Assets:  Physical Health  ADL's:  Intact  Cognition:  WNL  Sleep:  Number of Hours: 6.25   Continue cognitive and reality based therapy for psychotic disorder For this confusional psychosis continue antipsychotic therapy but discontinue Depakote that may be making things a bit worse Continue omega-3's and B vitamins for cognitive issues Escalate beta-blocker for hypertension  Treatment Plan Summary: Daily contact with patient to assess and evaluate symptoms and progress in treatment and Medication management  Sevyn Markham, MD 11/08/2018, 8:11 AM

## 2018-11-09 NOTE — Plan of Care (Signed)
  Problem: Coping: Goal: Ability to verbalize frustrations and anger appropriately will improve Outcome: Progressing Goal: Ability to demonstrate self-control will improve Outcome: Progressing   D: Pt alert and oriented on the unit. Pt sat in the day room with minimal interaction with other pts and RN staff.  Pt's affect was flat throughout the day and mood was depressed but pleasant. Pt appeared mildly confused, thought-blocking and guarded during the day. Pt denied any pain and is cooperative on the unit. A: Education, support and encouragement provided, q15 minute safety checks remain in effect. Medications administered per MD orders. R: No reactions/side effects to medicine noted. Pt denies any concerns at this time, and verbally contracts for safety. Pt ambulating on the unit with no issues. Pt remains safe on and off the unit.

## 2018-11-09 NOTE — Progress Notes (Addendum)
[facility] MD Progress Note  11/09/2018 1:32 PM Paula Dickson  MRN:  [id number]    Subjective: Mardi continue to have a difficult time with communicating.  Destony reports " I am not having a good day." Patient was unable to elaborate or articulate her current mood and feelings. Patient reports " I think I have AIDs" with short phases.  As reported by staff patient is urinating behind the door on towels. Patient was removed form don't admit. Patient to be "dont admit' after scheduled discharges on 11/11/2018. Patient is alert and oriented to self and place.  Patient appears to be restless and tremulous throughout this assessment.  Slow processing throughout the conversation.  Patient has been appropriate and redirectable while on the unit.  Orders placed for urine culture- resulted on 12/15/  R/O  UTI continue to monitor for safety.  Support encouragement reassurance was provided.    History: Per assessment  noted: 52 year old single female, no children, lives with sister.Patient presents as poor historian at this time, has difficulty answering questions or elaborating on pertinent information. Some thought blocking noted. Patient was brought to ED by sister at the recommendation of outpatient provider, but details are limited and patient does not provide significant information. She does report history of mental illness and states she has been diagnosed with Schizoaffective Disorder in the past . Chart notes indicate she had recently been switched from Lithium to Depakote, but it is not clear if this change coincided with increased symptoms or psychiatric deterioration.    Principal Problem: Bipolar disorder, most recent episode depressed ([place]) Diagnosis: Principal Problem:   Bipolar disorder, most recent episode depressed ([place]) Active Problems:   Bipolar affective disorder, current episode mixed ([place])  Total Time spent with patient: 20 minutes   Past Medical History:  Past Medical History:   Diagnosis Date  . Bipolar affective disorder ([place])   . History of arthritis   . History of chicken pox   . History of depression   . History of genital warts   . history of heart murmur   . History of high blood pressure   . History of thyroid disease   . History of UTI   . Hypertension   . Low TSH level 07/13/2017  . Schizophrenia [facility])     Past Surgical History:  Procedure Laterality Date  . ABLATION ON ENDOMETRIOSIS    . CYST REMOVAL NECK     around 11 years ago /benign  . MULTIPLE TOOTH EXTRACTIONS     Family History:  Family History  Problem Relation Age of Onset  . Arthritis Father   . Hyperlipidemia Father   . High blood pressure Father   . Diabetes Sister   . Diabetes Mother   . Diabetes Brother   . Mental illness Brother   . Alcohol abuse Paternal Uncle   . Alcohol abuse Paternal Grandfather   . Breast cancer Maternal Aunt   . Breast cancer Paternal Aunt   . High blood pressure Sister   . Mental illness Other        runs in family    Social History:  Social History   Substance and Sexual Activity  Alcohol Use No     Social History   Substance and Sexual Activity  Drug Use No    Social History   Socioeconomic History  . Marital status: Single    Spouse name: Not on file  . Number of children: Not on file  . Years of education:  Not on file  . Highest education level: Not on file  Occupational History  . Not on file  Social Needs  . Financial resource strain: Not on file  . Food insecurity:    Worry: Not on file    Inability: Not on file  . Transportation needs:    Medical: Not on file    Non-medical: Not on file  Tobacco Use  . Smoking status: Never Smoker  . Smokeless tobacco: Never Used  Substance and Sexual Activity  . Alcohol use: No  . Drug use: No  . Sexual activity: Not Currently  Lifestyle  . Physical activity:    Days per week: Not on file    Minutes per session: Not on file  . Stress: Not on file  Relationships  .  Social connections:    Talks on phone: Not on file    Gets together: Not on file    Attends religious service: Not on file    Active member of club or organization: Not on file    Attends meetings of clubs or organizations: Not on file    Relationship status: Not on file  Other Topics Concern  . Not on file  Social History Narrative  . Not on file   Sleep: Good  Appetite:  Good  Current Medications: Current Facility-Administered Medications  Medication Dose Route Frequency Provider Last Rate Last Dose  . acetaminophen (TYLENOL) tablet 650 mg  650 mg Oral Q6H PRN Ethelene Hal, NP      . alum & mag hydroxide-simeth (MAALOX/MYLANTA) 200-200-20 MG/5ML suspension 30 mL  30 mL Oral Q4H PRN Ethelene Hal, NP      . amLODipine (NORVASC) tablet 5 mg  5 mg Oral Daily Ethelene Hal, NP   5 mg at 11/09/18 0753  . antiseptic oral rinse (BIOTENE) solution 15 mL  15 mL Mouth Rinse PRN Lindon Romp A, NP      . hydrOXYzine (ATARAX/VISTARIL) tablet 25 mg  25 mg Oral TID PRN Ethelene Hal, NP   25 mg at 11/05/18 2136  . magnesium hydroxide (MILK OF MAGNESIA) suspension 30 mL  30 mL Oral Daily PRN Ethelene Hal, NP      . omega-3 acid ethyl esters (LOVAZA) capsule 1 g  1 g Oral BID Johnn Hai, MD   1 g at 11/09/18 0753  . prenatal multivitamin tablet 1 tablet  1 tablet Oral Q1200 Johnn Hai, MD   1 tablet at 11/09/18 1159  . propranolol ER (INDERAL LA) 24 hr capsule 80 mg  80 mg Oral Daily Johnn Hai, MD   80 mg at 11/09/18 0752  . risperiDONE (RISPERDAL) tablet 2 mg  2 mg Oral Daily Johnn Hai, MD   2 mg at 11/09/18 4270  . risperiDONE (RISPERDAL) tablet 4 mg  4 mg Oral QHS Johnn Hai, MD   4 mg at 11/08/18 2118  . traZODone (DESYREL) tablet 150 mg  150 mg Oral QHS PRN Johnn Hai, MD   150 mg at 11/08/18 2118  . vortioxetine HBr (TRINTELLIX) tablet 10 mg  10 mg Oral Daily Johnn Hai, MD   10 mg at 11/09/18 6237    Lab Results: No results found  for this or any previous visit (from the past 59 hour(s)).  Blood Alcohol level:  Lab Results  Component Value Date   [facility] <10 10/31/2018   ETH <5 62/83/1517    Metabolic Disorder Labs: Lab Results  Component Value Date   HGBA1C 5.4  11/03/2018   MPG 108.28 11/03/2018   No results found for: PROLACTIN Lab Results  Component Value Date   CHOL 150 11/03/2018   TRIG 164 (H) 11/03/2018   HDL 41 11/03/2018   CHOLHDL 3.7 11/03/2018   VLDL 33 11/03/2018   LDLCALC 76 11/03/2018   LDLCALC 84 12/10/2015    Physical Findings: AIMS: Facial and Oral Movements Muscles of Facial Expression: None, normal Lips and Perioral Area: None, normal Jaw: None, normal Tongue: None, normal,Extremity Movements Upper (arms, wrists, hands, fingers): None, normal Lower (legs, knees, ankles, toes): None, normal, Trunk Movements Neck, shoulders, hips: None, normal, Overall Severity Severity of abnormal movements (highest score from questions above): None, normal Incapacitation due to abnormal movements: None, normal Patient's awareness of abnormal movements (rate only patient's report): No Awareness, Dental Status Current problems with teeth and/or dentures?: Yes Does patient usually wear dentures?: Yes  CIWA:    COWS:     Musculoskeletal: Strength & Muscle Tone: within normal limits Gait & Station: normal Patient leans: N/A  Psychiatric Specialty Exam: Physical Exam  Vitals reviewed. Cardiovascular: Normal rate.  Psychiatric: She has a normal mood and affect. Her behavior is normal.    Review of Systems  Psychiatric/Behavioral: Negative for suicidal ideas. The patient is nervous/anxious.   All other systems reviewed and are negative.   Blood pressure (!) 117/100, pulse (!) 144, temperature 98.6 F (37 C), temperature source Oral, resp. rate 18, weight 68 kg, SpO2 100 %.Body mass index is 28.34 kg/m.  General Appearance: Disheveled malodors of urin  Eye Contact:  Fair  Speech:  Slow   Volume:  Decreased  Mood:  Dysphoric  Affect:  Flat  Thought Process:  Disorganized  Orientation:  Other:  Blocking  Thought Content:  blocked  Suicidal Thoughts:  No  Homicidal Thoughts:  No  Memory:  Recent;   Poor  Judgement:  Intact  Insight:  Fair  Psychomotor Activity:  Decreased  Concentration:  Concentration: Poor  Recall:  Poor  Fund of Knowledge:  Poor  Language:  Fair  Akathisia:  Negative  Handed:  Right  AIMS (if indicated):     Assets:  Physical Health  ADL's:  Intact  Cognition:  WNL  Sleep:  Number of Hours: 6     Treatment Plan Summary: Daily contact with patient to assess and evaluate symptoms and progress in treatment and Medication management   Continue with current treatment plan on 11/11/2018 as listed below except where noted:  Mood stabilization:   Continue with Risperdal 4 mg PO QHS and 2 mg po daily  Continue with Trintellix 10 mg PO daily     Charted MD on 12/13/2019For this confusional psychosis:antipsychotic therapy but discontinue Depakote that may be making things a bit worse Continue omega-3's and B vitamins for cognitive issues  CSW to continue with working on discharge disposition Patient encouraged to participate with group session   Derrill [place], NP 11/09/2018, 1:32 PM

## 2018-11-09 NOTE — Plan of Care (Addendum)
  Problem: Coping: Goal: Ability to verbalize frustrations and anger appropriately will improve Outcome: Progressing Goal: Ability to demonstrate self-control will improve Outcome: Progressing   D: Pt alert and oriented on the unit. Pt sat in the day room with minimal interaction with other pts and RN staff.  Pt's affect was flat throughout the day and mood was depressed and pleasant. Pt denied any pain, SI/HI, A/VH, and is cooperative on the unit. A: Education, support and encouragement provided, q15 minute safety checks remain in effect. Medications administered per MD orders. R: No reactions/side effects to medicine noted. Pt denies any concerns at this time, and verbally contracts for safety. Pt ambulating on the unit with no issues. Pt remains safe on and off the unit.

## 2018-11-09 NOTE — Progress Notes (Signed)
Patient ID: Paula Dickson, female   DOB: 1966-08-31, 52 y.o.   MRN: [id number] Per State regulations 482.30 this chart was reviewed for medical necessity with respect to the patient's admission/duration of stay.    Next review date:11/13/18  Debarah Crape, BSN, RN-BC  Case Manager

## 2018-11-09 NOTE — Progress Notes (Signed)
D: Pt denies SI/HI/AVH. Pt is pleasant and cooperative. Pt still presents confused at times. Pt has cognitive delay with her processing of information at times. " I just want to get better" pt visible on the unit at times, pt still appears paranoid at times, pt seems fearful to engage at times. Pt did say she enjoyed seeing her brother today.   A: Pt was offered support and encouragement. Pt was given scheduled medications. Pt was encourage to attend groups. Q 15 minute checks were done for safety.  R:Pt attends groups and interacts well with peers and staff. Pt is taking medication. Pt has no complaints.Pt receptive to treatment and safety maintained on unit.  Problem: Activity: Goal: Sleeping patterns will improve Outcome: Progressing   Problem: Safety: Goal: Periods of time without injury will increase Outcome: Progressing

## 2018-11-10 NOTE — Progress Notes (Signed)
Initial 1:1 RN note  D - Pt unsteady on her feet at the nurses' station. Vital signs were taken and NP notified. Pt placed on 1:1 for safety. No discomfort or distress noted. Pt is in the day room watching television.  A - 1:1 continues for safety of pt.   R - Pt remains safe on the unit.

## 2018-11-10 NOTE — Plan of Care (Signed)
  Problem: Education: Goal: Will be free of psychotic symptoms Outcome: Not Progressing   D: Pt alert and oriented on the unit. Pt denies SI/HI, A/VH. Pt's affect was flat and her mood was depressed today. Pt was guarded and paranoid during interactions with staff. Pt was unsteady on her feet this afternoon and placed on 1:1 for safety. Pt is pleasant and cooperative. A: Education, support and encouragement provided, q15 minute safety checks remain in effect. Medications administered per MD orders. R: No reactions/side effects to medicine noted. Pt denies any concerns at this time, and verbally contracts for safety. Pt ambulating on the unit with no issues. Pt remains safe on the unit.

## 2018-11-10 NOTE — Progress Notes (Signed)
Nursing 1:1 note D:Pt observed sleeping in bed with eyes closed. RR even and unlabored. No distress noted. Pt was visible on the milieu earlier this evening A: 1:1 observation continues for safety  R: pt remains safe

## 2018-11-10 NOTE — Progress Notes (Signed)
1:1 RN note  D - Pt is in dayroom watching television. Pt appears to be in no discomfort or distress.  A - 1:1 continues for pt's safety. R - Pt remains safe on the unit.

## 2018-11-11 LAB — COMPREHENSIVE METABOLIC PANEL
ALT: 17 U/L (ref 0–44)
AST: 19 U/L (ref 15–41)
Albumin: 4.4 g/dL (ref 3.5–5.0)
Alkaline Phosphatase: 60 U/L (ref 38–126)
Anion gap: 11 (ref 5–15)
BUN: 13 mg/dL (ref 6–20)
CO2: 28 mmol/L (ref 22–32)
Calcium: 10.3 mg/dL (ref 8.9–10.3)
Chloride: 101 mmol/L (ref 98–111)
Creatinine, Ser: 1.09 mg/dL — ABNORMAL HIGH (ref 0.44–1.00)
GFR calc Af Amer: >60 mL/min (ref >60)
GFR calc non Af Amer: 58 mL/min — ABNORMAL LOW (ref >60)
Glucose, Bld: 153 mg/dL — ABNORMAL HIGH (ref 70–99)
Potassium: 3.4 mmol/L — ABNORMAL LOW (ref 3.5–5.1)
Sodium: 140 mmol/L (ref 135–145)
Total Bilirubin: 0.4 mg/dL (ref 0.3–1.2)
Total Protein: 7.4 g/dL (ref 6.5–8.1)

## 2018-11-11 MED ORDER — BENZTROPINE MESYLATE 0.5 MG PO TABS
0.5000 mg | ORAL_TABLET | Freq: Two times a day (BID) | ORAL | Status: DC
  Administered 2018-11-11 – 2018-11-16 (×11): 0.5 mg via ORAL
  Filled 2018-11-11 (×15): qty 1

## 2018-11-11 MED ORDER — LORAZEPAM 0.5 MG PO TABS
0.5000 mg | ORAL_TABLET | Freq: Two times a day (BID) | ORAL | Status: DC
  Administered 2018-11-11 – 2018-11-12 (×3): 0.5 mg via ORAL
  Filled 2018-11-11 (×3): qty 1

## 2018-11-11 NOTE — Progress Notes (Addendum)
RN notified by sitter that pt was unsteady on her feet and fell in her room. Pt was assisted to the floor by sitter. Pt did not hit her head and no injury noted when assessed by RN. Vital signs obtained and MD notified. Pt continues on 1:1 observation for safety and continues to remain safe on the unit.

## 2018-11-11 NOTE — Progress Notes (Signed)
1;1 Note 1400  Patient sitting in wheelchair in hall.  Then moved to dayroom sitting in chair beside 1:1 watching tv.  Respirations even and unlabored.  No signs/symptoms of pain/distress noted on patient's face/body movements.  Safety maintained with 1:1 per MD order.

## 2018-11-11 NOTE — Progress Notes (Signed)
[facility] MD Progress Note  11/11/2018 10:18 AM Paula Dickson  MRN:  [id number] Subjective:   Patient has regressed over the weekend she is now in a near catatonic state she has some waxy flexibility but at the same time paradoxically has some EPS and significant parkinsonian type tremor. She had started to improve on the Risperdal so had escalated the dose but now it is proving to be too high. She requires one-to-one precautions due to recent balance and equilibrium issues  She is minimally verbal but not mute but not giving meaningful answers. Team discussions indicate the patient is more frightened by the middle you that there is such an acuity of psychosis that she is becoming more anxious and this is partly responsible for her progression. We will discuss possible internal medicine review of her case.  Principal Problem: Bipolar disorder, most recent episode depressed ([place]) Diagnosis: Principal Problem:   Bipolar disorder, most recent episode depressed ([place]) Active Problems:   Bipolar affective disorder, current episode mixed ([place])  Total Time spent with patient: 30 minutes   Past Medical History:  Past Medical History:  Diagnosis Date  . Bipolar affective disorder ([place])   . History of arthritis   . History of chicken pox   . History of depression   . History of genital warts   . history of heart murmur   . History of high blood pressure   . History of thyroid disease   . History of UTI   . Hypertension   . Low TSH level 07/13/2017  . Schizophrenia [facility])     Past Surgical History:  Procedure Laterality Date  . ABLATION ON ENDOMETRIOSIS    . CYST REMOVAL NECK     around 11 years ago /benign  . MULTIPLE TOOTH EXTRACTIONS     Family History:  Family History  Problem Relation Age of Onset  . Arthritis Father   . Hyperlipidemia Father   . High blood pressure Father   . Diabetes Sister   . Diabetes Mother   . Diabetes Brother   . Mental illness Brother   . Alcohol abuse  Paternal Uncle   . Alcohol abuse Paternal Grandfather   . Breast cancer Maternal Aunt   . Breast cancer Paternal Aunt   . High blood pressure Sister   . Mental illness Other        runs in family  Social History:  Social History   Substance and Sexual Activity  Alcohol Use No     Social History   Substance and Sexual Activity  Drug Use No    Social History   Socioeconomic History  . Marital status: Single    Spouse name: Not on file  . Number of children: Not on file  . Years of education: Not on file  . Highest education level: Not on file  Occupational History  . Not on file  Social Needs  . Financial resource strain: Not on file  . Food insecurity:    Worry: Not on file    Inability: Not on file  . Transportation needs:    Medical: Not on file    Non-medical: Not on file  Tobacco Use  . Smoking status: Never Smoker  . Smokeless tobacco: Never Used  Substance and Sexual Activity  . Alcohol use: No  . Drug use: No  . Sexual activity: Not Currently  Lifestyle  . Physical activity:    Days per week: Not on file    Minutes per session: Not  on file  . Stress: Not on file  Relationships  . Social connections:    Talks on phone: Not on file    Gets together: Not on file    Attends religious service: Not on file    Active member of club or organization: Not on file    Attends meetings of clubs or organizations: Not on file    Relationship status: Not on file  Other Topics Concern  . Not on file  Social History Narrative  . Not on file  Sleep: Fair  Appetite:  Fair  Current Medications: Current Facility-Administered Medications  Medication Dose Route Frequency Provider Last Rate Last Dose  . acetaminophen (TYLENOL) tablet 650 mg  650 mg Oral Q6H PRN Ethelene Hal, NP      . alum & mag hydroxide-simeth (MAALOX/MYLANTA) 200-200-20 MG/5ML suspension 30 mL  30 mL Oral Q4H PRN Ethelene Hal, NP      . amLODipine (NORVASC) tablet 5 mg  5 mg Oral  Daily Ethelene Hal, NP   5 mg at 11/11/18 0831  . antiseptic oral rinse (BIOTENE) solution 15 mL  15 mL Mouth Rinse PRN Lindon Romp A, NP      . hydrOXYzine (ATARAX/VISTARIL) tablet 25 mg  25 mg Oral TID PRN Ethelene Hal, NP   25 mg at 11/10/18 2119  . LORazepam (ATIVAN) tablet 0.5 mg  0.5 mg Oral BID Johnn Hai, MD      . magnesium hydroxide (MILK OF MAGNESIA) suspension 30 mL  30 mL Oral Daily PRN Ethelene Hal, NP      . omega-3 acid ethyl esters (LOVAZA) capsule 1 g  1 g Oral BID Johnn Hai, MD   1 g at 11/11/18 [phone]  . prenatal multivitamin tablet 1 tablet  1 tablet Oral Q1200 Johnn Hai, MD   1 tablet at 11/10/18 1213  . propranolol ER (INDERAL LA) 24 hr capsule 80 mg  80 mg Oral Daily Johnn Hai, MD   80 mg at 11/11/18 0831  . vortioxetine HBr (TRINTELLIX) tablet 10 mg  10 mg Oral Daily Johnn Hai, MD   10 mg at 11/11/18 0830    Lab Results: No results found for this or any previous visit (from the past 30 hour(s)).  Blood Alcohol level:  Lab Results  Component Value Date   ETH <10 10/31/2018   ETH <5 11/91/4782    Metabolic Disorder Labs: Lab Results  Component Value Date   HGBA1C 5.4 11/03/2018   MPG 108.28 11/03/2018   No results found for: PROLACTIN Lab Results  Component Value Date   CHOL 150 11/03/2018   TRIG 164 (H) 11/03/2018   HDL 41 11/03/2018   CHOLHDL 3.7 11/03/2018   VLDL 33 11/03/2018   LDLCALC 76 11/03/2018   LDLCALC 84 12/10/2015    Physical Findings: AIMS: Facial and Oral Movements Muscles of Facial Expression: None, normal Lips and Perioral Area: None, normal Jaw: None, normal Tongue: None, normal,Extremity Movements Upper (arms, wrists, hands, fingers): None, normal Lower (legs, knees, ankles, toes): None, normal, Trunk Movements Neck, shoulders, hips: None, normal, Overall Severity Severity of abnormal movements (highest score from questions above): None, normal Incapacitation due to abnormal movements:  None, normal Patient's awareness of abnormal movements (rate only patient's report): No Awareness, Dental Status Current problems with teeth and/or dentures?: Yes Does patient usually wear dentures?: Yes  CIWA:    COWS:     Musculoskeletal: Strength & Muscle Tone: Waxy flexibility in combination with parkinsonian tremor  of the upper extremity Gait & Station: unsteady, unable to stand Patient leans: N/A  Psychiatric Specialty Exam: Physical Exam  ROS  Blood pressure 140/81, pulse 84, temperature 98.3 F (36.8 C), temperature source Oral, resp. rate 18, weight 68 kg, SpO2 100 %.Body mass index is 28.34 kg/m.  General Appearance: Casual  Eye Contact:  Minimal  Speech:  Garbled  Volume:  Decreased  Mood:  Anxious and Dysphoric  Affect:  Blunt and Flat  Thought Process:  Disorganized  Orientation:  Other:  Person place situation  Thought Content:  Probably blocking  Suicidal Thoughts:  No  Homicidal Thoughts:  No  Memory:  Immediate;   Poor  Judgement:  Good  Insight:  Good  Psychomotor Activity:  Mannerisms and Psychomotor Retardation  Concentration:  Concentration: Poor  Recall:  Poor  Fund of Knowledge:  Poor  Language:  Poor  Akathisia:  Negative  Handed:  Right  AIMS (if indicated):     Assets:  Desire for Improvement Physical Health  ADL's:  Impaired  Cognition:  Impaired,  Moderate  Sleep:  Number of Hours: 6   For the EPS and what appears to be overmedication we will discontinue Risperdal for now For what it may be akathisia use lorazepam So hold off on antipsychotics Depression continue cognitive based therapy For anxiety try to move to different unit and use lorazepam  Treatment Plan Summary: Daily contact with patient to assess and evaluate symptoms and progress in treatment  Caria Transue, MD 11/11/2018, 10:18 AM

## 2018-11-11 NOTE — Progress Notes (Signed)
1:1 Note 1800  Patient ate her dinner (approximately 25%) in dayroom with 1:1 present.   Snacks also given patient.  Patient continues to sit in dayroom, watching tv and looking around room.  Stated she would like to go home but not sure if she is ready.  Touched wheelchair and stated she may need help at home.  Respirations even and unlabored.  No signs/symptoms of pain/distress noted on patient's face/body movements.  1:1 continues per MD order for safety.

## 2018-11-11 NOTE — Progress Notes (Signed)
1:1 RN note  D- Pt was in her room and sat in the day room for most of the morning watching television. Pt appears to be in no distress or discomfort.  A- 1:1 observation continues for pt's safety.  R- Pt remains safe on the unit.

## 2018-11-11 NOTE — Progress Notes (Signed)
Nursing 1:1 note D:Pt observed sitting in the dayroom not interacting. RR even and unlabored. No distress noted. A: 1:1 observation continues for safety  R: pt remains safe

## 2018-11-11 NOTE — Progress Notes (Signed)
Nursing Progress Note: 7p-7a D: Pt currently presents with a anxious/guarded/thought blocking/paranoid affect and behavior. Pt states "I had a better day today. I don't rmember why though." Not Interacting with the milieu. Pt reports good sleep during the previous night with current medication regimen. Pt did not attend wrap-up group.  A: Pt provided with medications per providers orders. Pt's labs and vitals were monitored throughout the night. Pt supported emotionally and encouraged to express concerns and questions. Pt educated on medications.  R: Pt's safety ensured with 15 minute and environmental checks. Pt currently denies SI, HI, and AVH. Pt verbally contracts to seek staff if SI,HI, or AVH occurs and to consult with staff before acting on any harmful thoughts. Will continue to monitor.

## 2018-11-11 NOTE — Progress Notes (Signed)
Recreation Therapy Notes  Date: 12.16.19 Time: 1000 Location: [address] Dayroom  Group Topic: Anxiety  Goal Area(s) Addresses:  Patient will identify triggers for anxiety.  Patient will identify physical symptoms of anxiety. Patient will identify coping skills for anxiety.  Behavioral Response: None  Intervention: Worksheet  Activity: Introduction to Anxiety.  Patients were to identify the triggers to their anxiety.  Pt were to also identify physical symptoms, thoughts and coping skills for anxiety.  Education: Anxiety, Discharge Planning  Education Outcome: Acknowledges understanding/In group clarification offered/Needs additional education.   Clinical Observations/Feedback: Pt was unable to complete worksheet even with assistance of her sitter.  Pt seemed to be distant and kept trying to get out of her wheelchair.  Pt was pleasant.      [name], LRT/CTRS     Ria Comment, Alison Kubicki A 11/11/2018 12:00 PM

## 2018-11-11 NOTE — Progress Notes (Signed)
UA container has been given patient today, but no sample given to staff.

## 2018-11-11 NOTE — Progress Notes (Signed)
Nursing 1:1 note D:Pt observed sleeping in bed with eyes closed. RR even and unlabored. No distress noted. A: 1:1 observation continues for safety  R: pt remains safe  

## 2018-11-11 NOTE — [facility] Group Notes (Signed)
Pt did not attend wrap up group this evening. Pt was in bed instead.

## 2018-11-11 NOTE — Progress Notes (Signed)
1:1 Note 1600  Patient has been sitting in dayroom with 1:1 present for safety.  Respirations even and unlabored.  No signs/symptoms of pain/distress noted on patient's face/body movements.  1:1 continues for patient's safety.

## 2018-11-12 LAB — URINALYSIS, ROUTINE W REFLEX MICROSCOPIC
Bilirubin Urine: NEGATIVE
Glucose, UA: NEGATIVE mg/dL
Hgb urine dipstick: NEGATIVE
Ketones, ur: NEGATIVE mg/dL
Nitrite: NEGATIVE
Protein, ur: NEGATIVE mg/dL
Specific Gravity, Urine: 1.005 (ref 1.005–1.030)
pH: 6 (ref 5.0–8.0)

## 2018-11-12 MED ORDER — LITHIUM CARBONATE 150 MG PO CAPS
150.0000 mg | ORAL_CAPSULE | Freq: Two times a day (BID) | ORAL | Status: DC
  Administered 2018-11-12 – 2018-11-13 (×3): 150 mg via ORAL
  Filled 2018-11-12 (×8): qty 1

## 2018-11-12 MED ORDER — LORAZEPAM 0.5 MG PO TABS
0.5000 mg | ORAL_TABLET | Freq: Three times a day (TID) | ORAL | Status: DC
  Administered 2018-11-12 – 2018-11-18 (×19): 0.5 mg via ORAL
  Filled 2018-11-12 (×19): qty 1

## 2018-11-12 MED ORDER — ENSURE ENLIVE PO LIQD
237.0000 mL | Freq: Two times a day (BID) | ORAL | Status: DC
  Administered 2018-11-12 – 2018-11-18 (×8): 237 mL via ORAL

## 2018-11-12 NOTE — Progress Notes (Signed)
Recreation Therapy Notes  Animal-Assisted Activity (AAA) Program Checklist/Progress Notes Patient Eligibility Criteria Checklist & Daily Group note for Rec Tx Intervention  Date: 12.17.19 Time: 1430 Location: [address]   AAA/T Program Assumption of Risk Form signed by Teacher, music or Parent Legal Guardian  YES  Patient is free of allergies or sever asthma  YES   Patient reports no fear of animals  YES   Patient reports no history of cruelty to animals YES   Patient understands his/her participation is voluntary  YES   Patient washes hands before animal contact YES  Patient washes hands after animal contact  YES   Behavioral Response: Engaged  Education: Contractor, Appropriate Animal Interaction   Education Outcome: Acknowledges understanding/In group clarification offered/Needs additional education.   Clinical Observations/Feedback: Pt attended group activity.    [name], LRT/CTRS         [name] A 11/12/2018 3:04 PM

## 2018-11-12 NOTE — Progress Notes (Signed)
Patient ID: Paula Dickson, female   DOB: 1966/05/07, 52 y.o.   MRN: [id number]  1:1 Nursing Progress Note  D: Patient is observed with sitter present per [place]. Patient has bathed in the tub room this morning and is supported in completing her ADL's. Patient remains disoriented requiring staff assistance/redirection.  Environment is secured.  A: Patient remains on 1:1 observation per provider orders. High fall risk precautions in place. Patient safety monitored with q15 minute safety checks. Patient medicated as ordered.  R: Patient remains safe on the unit at this time. Will continue to monitor with 1:1 sitter, q15 min safety checks & q4 hour nursing assessments.

## 2018-11-12 NOTE — Progress Notes (Signed)
Patient ID: Paula Dickson, female   DOB: [birth year], 52 y.o.   MRN: [id number]  Nursing Progress Note [id number]  Data: Patient presents with significant thought blocking and is electively mute. Patient does appear apprehensive/guarded but will nod her head yes/no to answer questions. Patient with generalized weakness this morning and is ambulating with the wheelchair. Patient does have mild tremors to upper extremities. Sitter reports patient's intake of breakfast was poor (less than 50%). PO fluids pushed. Patient was provided a cup of water, cup of ginger ale, and drank an ensure. Patient compliant with scheduled medications. Patient unable to complete self-inventory sheet. Patient denies pain. Patient currently denies SI/HI and writer is unable to assess AVH. Urine hat placed in toilet to collect urine specimen.   Action: Patient is educated about and provided medication per provider's orders. Patient safety maintained with q15 min safety checks and frequent rounding. High fall risk precautions in place. Emotional support given. 1:1 interaction and active listening provided. Patient encouraged to attend meals, groups, and work on treatment plan and goals. Labs, vital signs and patient behavior monitored throughout shift. 1:1 observation remains in place per MD order.  Response: Patient remains safe on the unit at this time and agrees to come to staff with any issues/concerns. Will continue to support and monitor.

## 2018-11-12 NOTE — Progress Notes (Signed)
1:1 Progress Note D: Pt currently asleep. Patient appropriate to situation. Pt in no current distress.  A: Sitter is currently at bedside. R: Pt remains safe on a 1:1 per MD orders.

## 2018-11-12 NOTE — Progress Notes (Signed)
[facility] Group Notes:  (Nursing/MHT/Case Management/Adjunct)  Date:  11/12/2018  Time:  1600  Type of Therapy:  Music Therapy  Participation Level:  Did Not Attend  Summary of Progress/Problems: Patient was invited to group, but did not attend.  Paula Dickson 11/12/2018, 6:53 PM 

## 2018-11-12 NOTE — Plan of Care (Signed)
  Problem: Activity: Goal: Interest or engagement in activities will improve Outcome: Progressing   Patient is seen visible in the dayroom attending groups but does not engage much with peers.    Problem: Health Behavior/Discharge Planning: Goal: Compliance with treatment plan for underlying cause of condition will improve Outcome: Progressing   Patient is taking medications and complying with treatment plan.    Problem: Safety: Goal: Periods of time without injury will increase Outcome: Progressing   Patient is on q15 minute safety checks and 1:1 observation.

## 2018-11-12 NOTE — Progress Notes (Signed)
Patient ID: Paula Dickson, female   DOB: 1966-08-15, 52 y.o.   MRN: [id number]  1:1 Nursing Progress Note  D: Patient is seen sitting up in the dayroom conversing with sitter and the MHT. Patient has a more animated affect and is able to communicate better with staff. Patient denies concerns at this time but was inquiring about the court date for her IVC. Patient seen smiling but does still have some mild thought blocking that has improved. Patient did eat almost all of her dinner and was compliant with evening medications. Environment is secured.  A: Patient remains on 1:1 observation per provider orders. High fall risk precautions in place. Patient safety monitored with q15 minute safety checks.  R: Patient remains safe on the unit at this time. Will continue to monitor with 1:1 sitter, q15 min safety checks & q4 hour nursing assessments.

## 2018-11-12 NOTE — Progress Notes (Signed)
Nursing 1:1 Note  D: Pt observed sitting in dayroom, seen interacting with sitter from time to time. Pt appears flat/anxious/blank in affect and mood. Pt denies SI/HI/AVH/Pain at this time.PRNs offered; Pt declined. No new c/o's. Pt states she would like to talk to Education officer, museum and MD tomorrow morning. Pt remains slow to process with mild confusion. HFR maintained due to unsteady gait.  A: 1:1 observation continues for safety. Sitter within arms reach.  R: Pt remains safe at this time.

## 2018-11-12 NOTE — Progress Notes (Signed)
[facility] MD Progress Note  11/12/2018 9:47 AM Paula Dickson  MRN:  [id number] Subjective: Patient is seen and examined.  Patient is a 52 year old female with a past psychiatric history significant for schizoaffective disorder versus bipolar disorder.  She was admitted on 11/02/2018.  Objective: Patient is seen and examined.  Patient is a 52 year old female with the above-stated past psychiatric history who is seen in follow-up.  Most recently she has been significantly catatonic.  She was admitted, and adjustments in her medications were made.  She was most recently given some Risperdal, but that seemed to make her thought blocking and akathisia worse.  That was stopped a couple of days ago.  She has still been receiving oral lorazepam which seems to have helped to a degree.  Review of the electronic medical record revealed that she had been previously treated with lithium, Seroquel, Abilify maintain a, Zyprexa.  It looks like when she was discharged from [facility] in the past she had been treated with lithium, but that it been stopped and she was switched to Depakote.  It is unclear what or why the lithium was stopped.  Her renal function is normal.  She is hyperthyroid, but review of the electronic medical record was unable to find what the source of her hyperthyroidism is, or any kind of imaging to see if she had hot or cold nodules.  She is on propranolol, and that is probably on board for her hyperthyroidism.  She is on long-acting propranolol currently, in the past was on short acting propranolol 20 mg 3 times a day.  This morning on examination she is really only alert and oriented to 2.  She is unable to come up with a year.  She remains bedbound currently.  She kept the covers pulled up over her face.  She denied any suicidal ideation this morning.  She is somewhat tremulous.  Principal Problem: Bipolar disorder, most recent episode depressed ([place]) Diagnosis: Principal Problem:    Bipolar disorder, most recent episode depressed ([place]) Active Problems:   Bipolar affective disorder, current episode mixed ([place])  Total Time spent with patient: 45 minutes  Past Psychiatric History: See admission H&P  Past Medical History:  Past Medical History:  Diagnosis Date  . Bipolar affective disorder ([place])   . History of arthritis   . History of chicken pox   . History of depression   . History of genital warts   . history of heart murmur   . History of high blood pressure   . History of thyroid disease   . History of UTI   . Hypertension   . Low TSH level 07/13/2017  . Schizophrenia [facility])     Past Surgical History:  Procedure Laterality Date  . ABLATION ON ENDOMETRIOSIS    . CYST REMOVAL NECK     around 11 years ago /benign  . MULTIPLE TOOTH EXTRACTIONS     Family History:  Family History  Problem Relation Age of Onset  . Arthritis Father   . Hyperlipidemia Father   . High blood pressure Father   . Diabetes Sister   . Diabetes Mother   . Diabetes Brother   . Mental illness Brother   . Alcohol abuse Paternal Uncle   . Alcohol abuse Paternal Grandfather   . Breast cancer Maternal Aunt   . Breast cancer Paternal Aunt   . High blood pressure Sister   . Mental illness Other        runs in family  Family Psychiatric  History: See admission H&P Social History:  Social History   Substance and Sexual Activity  Alcohol Use No     Social History   Substance and Sexual Activity  Drug Use No    Social History   Socioeconomic History  . Marital status: Single    Spouse name: Not on file  . Number of children: Not on file  . Years of education: Not on file  . Highest education level: Not on file  Occupational History  . Not on file  Social Needs  . Financial resource strain: Not on file  . Food insecurity:    Worry: Not on file    Inability: Not on file  . Transportation needs:    Medical: Not on file    Non-medical: Not on file  Tobacco Use  .  Smoking status: Never Smoker  . Smokeless tobacco: Never Used  Substance and Sexual Activity  . Alcohol use: No  . Drug use: No  . Sexual activity: Not Currently  Lifestyle  . Physical activity:    Days per week: Not on file    Minutes per session: Not on file  . Stress: Not on file  Relationships  . Social connections:    Talks on phone: Not on file    Gets together: Not on file    Attends religious service: Not on file    Active member of club or organization: Not on file    Attends meetings of clubs or organizations: Not on file    Relationship status: Not on file  Other Topics Concern  . Not on file  Social History Narrative  . Not on file   Additional Social History:                         Sleep: Good  Appetite:  Fair  Current Medications: Current Facility-Administered Medications  Medication Dose Route Frequency Provider Last Rate Last Dose  . acetaminophen (TYLENOL) tablet 650 mg  650 mg Oral Q6H PRN Ethelene Hal, NP      . alum & mag hydroxide-simeth (MAALOX/MYLANTA) 200-200-20 MG/5ML suspension 30 mL  30 mL Oral Q4H PRN Ethelene Hal, NP      . amLODipine (NORVASC) tablet 5 mg  5 mg Oral Daily Ethelene Hal, NP   5 mg at 11/12/18 0751  . antiseptic oral rinse (BIOTENE) solution 15 mL  15 mL Mouth Rinse PRN Lindon Romp A, NP      . benztropine (COGENTIN) tablet 0.5 mg  0.5 mg Oral BID Johnn Hai, MD   0.5 mg at 11/12/18 0751  . feeding supplement (ENSURE ENLIVE) (ENSURE ENLIVE) liquid 237 mL  237 mL Oral BID BM Sharma Covert, MD   237 mL at 11/12/18 4315  . hydrOXYzine (ATARAX/VISTARIL) tablet 25 mg  25 mg Oral TID PRN Ethelene Hal, NP   25 mg at 11/10/18 2119  . lithium carbonate capsule 150 mg  150 mg Oral BID WC Sharma Covert, MD      . LORazepam (ATIVAN) tablet 0.5 mg  0.5 mg Oral TID Sharma Covert, MD      . magnesium hydroxide (MILK OF MAGNESIA) suspension 30 mL  30 mL Oral Daily PRN Ethelene Hal, NP      . omega-3 acid ethyl esters (LOVAZA) capsule 1 g  1 g Oral BID Johnn Hai, MD   1 g at 11/12/18 0751  . prenatal  multivitamin tablet 1 tablet  1 tablet Oral Q1200 Johnn Hai, MD   1 tablet at 11/11/18 1230  . propranolol ER (INDERAL LA) 24 hr capsule 80 mg  80 mg Oral Daily Johnn Hai, MD   80 mg at 11/12/18 0752  . vortioxetine HBr (TRINTELLIX) tablet 10 mg  10 mg Oral Daily Johnn Hai, MD   10 mg at 11/12/18 4970    Lab Results:  Results for orders placed or performed during the hospital encounter of 11/01/18 (from the past 48 hour(s))  Comprehensive metabolic panel     Status: Abnormal   Collection Time: 11/11/18  6:26 PM  Result Value Ref Range   Sodium 140 135 - 145 mmol/L   Potassium 3.4 (L) 3.5 - 5.1 mmol/L   Chloride 101 98 - 111 mmol/L   CO2 28 22 - 32 mmol/L   Glucose, Bld 153 (H) 70 - 99 mg/dL   BUN 13 6 - 20 mg/dL   Creatinine, Ser 1.09 (H) 0.44 - 1.00 mg/dL   Calcium 10.3 8.9 - 10.3 mg/dL   Total Protein 7.4 6.5 - 8.1 g/dL   Albumin 4.4 3.5 - 5.0 g/dL   AST 19 15 - 41 U/L   ALT 17 0 - 44 U/L   Alkaline Phosphatase 60 38 - 126 U/L   Total Bilirubin 0.4 0.3 - 1.2 mg/dL   GFR calc non Af Amer 58 (L) >60 mL/min   GFR calc Af Amer >60 >60 mL/min   Anion gap 11 5 - 15    Comment: Performed at [facility], [place] [address] [place], [place] [address]    Blood Alcohol level:  Lab Results  Component Value Date   [facility] <10 10/31/2018   ETH <5 58/85/0277    Metabolic Disorder Labs: Lab Results  Component Value Date   HGBA1C 5.4 11/03/2018   MPG 108.28 11/03/2018   No results found for: PROLACTIN Lab Results  Component Value Date   CHOL 150 11/03/2018   TRIG 164 (H) 11/03/2018   HDL 41 11/03/2018   CHOLHDL 3.7 11/03/2018   VLDL 33 11/03/2018   LDLCALC 76 11/03/2018   LDLCALC 84 12/10/2015    Physical Findings: AIMS: Facial and Oral Movements Muscles of Facial Expression: None, normal Lips and Perioral Area: None,  normal Jaw: None, normal Tongue: None, normal,Extremity Movements Upper (arms, wrists, hands, fingers): Mild Lower (legs, knees, ankles, toes): None, normal, Trunk Movements Neck, shoulders, hips: None, normal, Overall Severity Severity of abnormal movements (highest score from questions above): Mild Incapacitation due to abnormal movements: None, normal Patient's awareness of abnormal movements (rate only patient's report): Aware, no distress, Dental Status Current problems with teeth and/or dentures?: Yes Does patient usually wear dentures?: Yes  CIWA:    COWS:     Musculoskeletal: Strength & Muscle Tone: decreased Gait & Station: Lying in bed, but patient has gotten out of bed recently. Patient leans: Backward  Psychiatric Specialty Exam: Physical Exam  Nursing note and vitals reviewed. Constitutional: She appears well-developed and well-nourished.  HENT:  Head: Normocephalic and atraumatic.  Respiratory: Effort normal.  Neurological: She is alert.    ROS  Blood pressure 140/78, pulse 79, temperature 98.6 F (37 C), temperature source Oral, resp. rate 16, weight 68 kg, SpO2 100 %.Body mass index is 28.34 kg/m.  General Appearance: Casual  Eye Contact:  Fair  Speech:  Blocked and Slow  Volume:  Normal  Mood:  Anxious and Dysphoric  Affect:  Congruent  Thought Process:  Disorganized and Descriptions of Associations: Circumstantial  Orientation:  Other:  Alert and oriented to 2 [facility], self.  Thought Content:  Illogical  Suicidal Thoughts:  No  Homicidal Thoughts:  No  Memory:  Immediate;   Poor Recent;   Poor Remote;   Poor  Judgement:  Impaired  Insight:  Fair  Psychomotor Activity:  Increased  Concentration:  Concentration: Fair and Attention Span: Fair  Recall:  Poor  Fund of Knowledge:  Fair  Language:  Fair  Akathisia:  Negative  Handed:  Right  AIMS (if indicated):     Assets:  Desire for Improvement Financial  Resources/Insurance Housing Resilience  ADL's:  Intact  Cognition:  WNL  Sleep:  Number of Hours: 6.75     Treatment Plan Summary: Daily contact with patient to assess and evaluate symptoms and progress in treatment, Medication management and Plan : Patient is seen and examined.  Patient is a 52 year old female with a past psychiatric history significant for schizoaffective disorder; bipolar type versus bipolar disorder.  She is seen in follow-up.  She really has not made much progress since admission.  Review of the record showed at least she has been treated with lithium in the past.  I am going to restart lithium at low dose 150 mg p.o. twice daily.  At least there is some support that has been effective for her in the past.  Also its unclear how much a role that her hyperthyroidism is playing in all of this.  I was unable to find any documentation of a thyroid work-up given her hyperthyroidism.  I am going to reorder her TSH as well as thyroid-stimulating immunoglobulin.  I am also going to order an ultrasound to see if there is any nodules that may be involved here that we want to get a nuclear scan on.  It is just unclear at this point what role the thyroid may be playing in her illness altogether.  I am also going to increase her lorazepam to 0.5 mg p.o. 3 times daily.  No other changes in her medicines at this time. 1.  Add lithium carbonate 150 mg p.o. twice daily for mood stability. 2.  Increase lorazepam to 0.5 mg p.o. 3 times daily for catatonia as well as anxiety. 3.  Continue Trintellix 10 mg p.o. daily for mood and anxiety. 4.  Continue amlodipine 5 mg p.o. daily for hypertension. 5.  Continue propranolol ER 80 mg p.o. daily for hypothyroidism and hypertension. 6.  Repeat TSH, order TSI, get thyroid ultrasound. 7.  Disposition planning-in progress.  Sharma Covert, MD 11/12/2018, 9:47 AM

## 2018-11-12 NOTE — Progress Notes (Signed)
Patient ID: Paula Dickson, female   DOB: December 14, 1965, 52 y.o.   MRN: [id number]  1:1 Nursing Progress Note  D: Patient is observed with sitter present per [place]. Environment is secured. Patient is observed sitting up in the dayroom watching TV. Patient has been attending groups. Patient eye contact/verbal communication is improving. Sitter reports patient has still not voided. Patient denies issues with voiding.    A: Patient remains on 1:1 observation per provider orders. High fall risk precautions in place. Patient safety monitored with q15 minute safety checks. Patient medicated as ordered. Staff is encouraging patient to use the bathroom.  R: Patient remains safe on the unit at this time. Will continue to monitor with 1:1 sitter, q15 min safety checks & q4 hour nursing assessments.

## 2018-11-12 NOTE — [facility] Group Notes (Signed)
Pt was invited but did not attend orientation/goals group facilitated by [facility] Michael A.  

## 2018-11-13 ENCOUNTER — Ambulatory Visit (HOSPITAL_COMMUNITY): Payer: Medicare Other | Attending: Psychiatry

## 2018-11-13 DIAGNOSIS — E0590 Thyrotoxicosis, unspecified without thyrotoxic crisis or storm: Secondary | ICD-10-CM | POA: Insufficient documentation

## 2018-11-13 LAB — THYROID STIMULATING IMMUNOGLOBULIN: Thyroid Stimulating Immunoglob: <0.1 IU/L (ref 0.00–0.55)

## 2018-11-13 LAB — TSH: TSH: 0.107 u[IU]/mL — ABNORMAL LOW (ref 0.350–4.500)

## 2018-11-13 MED ORDER — LITHIUM CARBONATE 300 MG PO CAPS
300.0000 mg | ORAL_CAPSULE | Freq: Two times a day (BID) | ORAL | Status: DC
  Administered 2018-11-13 – 2018-11-17 (×8): 300 mg via ORAL
  Filled 2018-11-13 (×11): qty 1

## 2018-11-13 MED ORDER — QUETIAPINE FUMARATE 100 MG PO TABS
100.0000 mg | ORAL_TABLET | Freq: Every day | ORAL | Status: DC
  Administered 2018-11-13: 100 mg via ORAL
  Filled 2018-11-13 (×2): qty 1

## 2018-11-13 NOTE — [facility] Group Notes (Signed)
[place] Group Notes:  Nursing  Date:  11/13/2018  Time:  4:00 PM  Type of Therapy:  Psychoeducational Skills   Group Topic: [name] and Well-being  Group Leader: Marzetta Merino, RN  Type of Therapy:  Psychoeducational Skills  Participation Level:  Active  Participation Quality:  Appropriate and Attentive  Affect:  Appropriate  Cognitive:  Alert and Oriented  Insight:  Improving  Engagement in Group:  Developing/Improving  Modes of Intervention:  Discussion and Socialization  Summary of Progress/Problems: Patient attended group and participated.  Paula Dickson 11/13/2018, 5:00 PM

## 2018-11-13 NOTE — Progress Notes (Signed)
Nursing 1:1 note D:Pt observed sitting in the dayroom coloring . RR even and unlabored. No distress noted. A: 1:1 close observation continues for safety  R: pt remains safe

## 2018-11-13 NOTE — Evaluation (Signed)
Occupational Therapy Evaluation Patient Details Name: Paula Dickson MRN: [id number] DOB: 11/20/1966 Today's Date: 11/13/2018    History of Present Illness Pt presents to [facility] as recommended by outpatient provider. At baseline she lives with sister. She is a poor historian on background information, mentions a hx of schizoaffective disorder. Pt is consulted for OT after experiencing a change in functional mobility and cognition.   Clinical Impression   Pt presenting with generalized weakness and difficulties with cognition limiting ability to engage in BADL at desired level of ind. Per chart review, pt had decompensated during time at [facility] and became somewhat catatonic with thought blocking and significant decrease in functional mobility. Pt completing functional mobility at close min guard for safety, still intermittent LOB (appears unsteady intermittently). Pt showered at supervision level, in standing shower. Pt needing moderate cues for sequencing in bathing and dressing. Cues and set up required for majority of supplies. Pt completed dressing in standing, when beginning to lose balance OT educated and cued pt to sit to complete LB dressing. Pt complied, appears some deficit in safety awareness. Pt is poor historian in recalling baseline. Pt is on 1:1 currently, recommend pt still receive assistance in some capacity for cues and safety on unit.     Follow Up Recommendations  No OT follow up;Supervision/Assistance - 24 hour    Equipment Recommendations  None recommended by OT    Recommendations for Other Services       Precautions / Restrictions Precautions Precautions: Fall Restrictions Weight Bearing Restrictions: No      Mobility Bed Mobility Overal bed mobility: Modified Independent                Transfers                 General transfer comment: mod I for transfers    Balance Overall balance assessment: Needs assistance Sitting-balance support: Bilateral  upper extremity supported Sitting balance-Leahy Scale: Good     Standing balance support: No upper extremity supported;During functional activity Standing balance-Leahy Scale: Poor Standing balance comment: with intermittent LOB, catches self; needing close min guard                           ADL either performed or assessed with clinical judgement   ADL Overall ADL's : Needs assistance/impaired Eating/Feeding: Independent   Grooming: Min guard;Standing Grooming Details (indicate cue type and reason): close minguard for safety; cues for cognition (attempted to use lotion as soap) Upper Body Bathing: Supervision/ safety Upper Body Bathing Details (indicate cue type and reason): completing standing in shower Lower Body Bathing: Supervison/ safety Lower Body Bathing Details (indicate cue type and reason): completed standing in shower Upper Body Dressing : Set up   Lower Body Dressing: Set up;Sit to/from stand Lower Body Dressing Details (indicate cue type and reason): attempted in standing, LOB; encouraged pt to sit Toilet Transfer: Supervision/safety   Toileting- Clothing Manipulation and Hygiene: Modified independent   Tub/ Shower Transfer: English as a second language teacher Details (indicate cue type and reason): supervision for safety Functional mobility during ADLs: Supervision/safety General ADL Comments: At this time pt needing min guard for intermittent LOB, abnd supervision and cues to complete BADL ind      Vision Baseline Vision/History: Wears glasses Wears Glasses: At all times Patient Visual Report: No change from baseline       Perception     Praxis      Pertinent Vitals/Pain Pain Assessment: No/denies pain  Hand Dominance     Extremity/Trunk Assessment Upper Extremity Assessment Upper Extremity Assessment: Generalized weakness   Lower Extremity Assessment Lower Extremity Assessment: Generalized weakness       Communication      Cognition Arousal/Alertness: Awake/alert Behavior During Therapy: Flat affect;Anxious Overall Cognitive Status: Impaired/Different from baseline Area of Impairment: Memory;Problem solving;Attention                               General Comments: pt showing difficulty processing and sequencing multi step tasks during ADLs; unsure of baseline   General Comments       Exercises     Shoulder Instructions      Home Living Family/patient expects to be discharged to:: Private residence Living Arrangements: Other relatives Available Help at Discharge: Family                                    Prior Functioning/Environment Level of Independence: Independent                 OT Problem List: Decreased strength;Decreased knowledge of use of DME or AE;Impaired balance (sitting and/or standing)      OT Treatment/Interventions: Self-care/ADL training;Balance training;Therapeutic exercise;Therapeutic activities;Patient/family education    OT Goals(Current goals can be found in the care plan section) Acute Rehab OT Goals Patient Stated Goal: to leave soon OT Goal Formulation: With patient Time For Goal Achievement: 11/27/18 Potential to Achieve Goals: Good  OT Frequency: Min 1X/week   Barriers to D/C:            Co-evaluation              AM-PAC OT "6 Clicks" Daily Activity     Outcome Measure Help from another person eating meals?: None Help from another person taking care of personal grooming?: A Little Help from another person toileting, which includes using toliet, bedpan, or urinal?: None Help from another person bathing (including washing, rinsing, drying)?: A Little Help from another person to put on and taking off regular upper body clothing?: None Help from another person to put on and taking off regular lower body clothing?: None 6 Click Score: 22   End of Session Nurse Communication: Mobility status  Activity Tolerance:  Patient tolerated treatment well Patient left: in bed;with nursing/sitter in room  OT Visit Diagnosis: Other abnormalities of gait and mobility (R26.89);Muscle weakness (generalized) (M62.81)                Time: 1443-1540 OT Time Calculation (min): 32 min Charges:  OT General Charges $OT Visit: 1 Visit OT Evaluation $OT Eval Moderate Complexity: 1 Mod OT Treatments $Self Care/Home Management : 8-22 mins  Zenovia Jarred, MSOT, OTR/L Behavioral Health OT/ Acute Relief OT PHP Office: [place] 11/13/2018, 4:50 PM

## 2018-11-13 NOTE — Progress Notes (Signed)
Recreation Therapy Notes  Date: 12.18.19 Time: 0930 Location: [address] Dayroom  Group Topic: Stress Management  Goal Area(s) Addresses:  Patient will verbalize importance of using healthy stress management.  Patient will identify positive emotions associated with healthy stress management.   Intervention: Stress Management  Activity : Progressive Muscle Relaxation.  LRT introduced the stress management technique of progressive muscle relaxation.  LRT read a script guiding patients through the process of tensing and releasing each muscle one at a time.  Education:  Stress Management, Discharge Planning.   Education Outcome: Acknowledges edcuation/In group clarification offered/Needs additional education  Clinical Observations/Feedback: Pt did not attend group .    [name], LRT/CTRS         [name] A 11/13/2018 11:46 AM

## 2018-11-13 NOTE — Progress Notes (Signed)
Patient ID: Paula Dickson, female   DOB: [birth year], 52 y.o.   MRN: [id number]  1:1 Nursing Progress Note  D: Patient is observed up in the dayroom attending group. Patient is ambulating independently. Patient denies concerns for writer at this time. Environment is secured. Sitter observed with patient.  A: Patient remains on 1:1 observation per provider orders. High fall risk precautions in place. Patient safety monitored with q15 minute safety checks. Medications provided per MD order.  R: Patient remains safe on the unit at this time. Will continue to monitor with 1:1 sitter, q15 min safety checks & q4 hour nursing assessments.

## 2018-11-13 NOTE — Progress Notes (Signed)
Patient ID: Paula Dickson, female   DOB: 05-28-1966, 52 y.o.   MRN: [id number]  Close Observation Nursing Progress Note  D: Patient is observed ambulating on the unit independently. Patient appears less confused but does still require staff cues and direction. MD has modified 1:1 observation to a close observation. Patient has showered in her room and ate most of her dinner. Patient does appear ambivalent about taking medications but is agreeable after reassurance from Probation officer. Environment is secured. Sitter observed with patient.  A: Patient remains on close observation per provider orders. High fall risk precautions in place. Patient safety monitored with q15 minute safety checks. Medications provided as ordered.  R: Patient remains safe on the unit at this time. Will continue to monitor with close observation sitter, q15 min safety checks & q4 hour nursing assessments.

## 2018-11-13 NOTE — Tx Team (Signed)
Interdisciplinary Treatment and Diagnostic Plan Update  11/13/2018 Time of Session: 9:00am Altie Savard MRN: [id number]  Principal Diagnosis: Bipolar disorder, most recent episode depressed ([place])  Secondary Diagnoses: Principal Problem:   Bipolar disorder, most recent episode depressed ([place]) Active Problems:   Bipolar affective disorder, current episode mixed ([place])   Current Medications:  Current Facility-Administered Medications  Medication Dose Route Frequency Provider Last Rate Last Dose  . acetaminophen (TYLENOL) tablet 650 mg  650 mg Oral Q6H PRN Ethelene Hal, NP      . alum & mag hydroxide-simeth (MAALOX/MYLANTA) 200-200-20 MG/5ML suspension 30 mL  30 mL Oral Q4H PRN Ethelene Hal, NP      . amLODipine (NORVASC) tablet 5 mg  5 mg Oral Daily Ethelene Hal, NP   5 mg at 11/13/18 0759  . antiseptic oral rinse (BIOTENE) solution 15 mL  15 mL Mouth Rinse PRN Lindon Romp A, NP      . benztropine (COGENTIN) tablet 0.5 mg  0.5 mg Oral BID Johnn Hai, MD   0.5 mg at 11/13/18 0759  . feeding supplement (ENSURE ENLIVE) (ENSURE ENLIVE) liquid 237 mL  237 mL Oral BID BM Sharma Covert, MD   237 mL at 11/12/18 1200  . hydrOXYzine (ATARAX/VISTARIL) tablet 25 mg  25 mg Oral TID PRN Ethelene Hal, NP   25 mg at 11/10/18 2119  . lithium carbonate capsule 150 mg  150 mg Oral BID WC Sharma Covert, MD   150 mg at 11/13/18 0759  . LORazepam (ATIVAN) tablet 0.5 mg  0.5 mg Oral TID Sharma Covert, MD   0.5 mg at 11/13/18 0759  . magnesium hydroxide (MILK OF MAGNESIA) suspension 30 mL  30 mL Oral Daily PRN Ethelene Hal, NP      . omega-3 acid ethyl esters (LOVAZA) capsule 1 g  1 g Oral BID Johnn Hai, MD   1 g at 11/12/18 1712  . prenatal multivitamin tablet 1 tablet  1 tablet Oral Q1200 Johnn Hai, MD   1 tablet at 11/12/18 1712  . propranolol ER (INDERAL LA) 24 hr capsule 80 mg  80 mg Oral Daily Johnn Hai, MD   80 mg at 11/13/18 0759  .  vortioxetine HBr (TRINTELLIX) tablet 10 mg  10 mg Oral Daily Johnn Hai, MD   10 mg at 11/13/18 0759   PTA Medications: Medications Prior to Admission  Medication Sig Dispense Refill Last Dose  . ABILIFY MAINTENA 400 MG SRER injection Inject 400 mg into the muscle every 30 (thirty) days. Last 10/03/2018  2 Past Month at Unknown time  . amLODipine (NORVASC) 5 MG tablet TAKE 1 TABLET BY MOUTH EVERY DAY (Patient taking differently: Take 5 mg by mouth daily. ) 30 tablet 6 10/31/2018 at Unknown time  . benztropine (COGENTIN) 0.5 MG tablet Take 1 tablet (0.5 mg total) by mouth at bedtime. 30 tablet 0 10/30/2018 at Unknown time  . chlorhexidine (PERIDEX) 0.12 % solution 15 mLs by Mouth Rinse route 2 (two) times daily as needed (mouth pain). Rinse and spit  3 10/31/2018 at Unknown time  . divalproex (DEPAKOTE ER) 500 MG 24 hr tablet Take 1,000 mg by mouth at bedtime.  1 10/30/2018 at Unknown time  . divalproex (DEPAKOTE SPRINKLE) 125 MG capsule Take 1 capsule (125 mg total) by mouth 2 (two) times daily. (Patient not taking: Reported on 08/11/2018)   Not Taking at Unknown time  . lithium carbonate (ESKALITH) 450 MG CR tablet Take 1 tablet (450  mg total) by mouth every 12 (twelve) hours. (Patient not taking: Reported on 07/11/2017) 60 tablet 0 Not Taking at Unknown time  . Multiple Vitamins-Minerals (MULTIVITAMIN ADULT) TABS Take 1 tablet by mouth daily.    10/31/2018 at Unknown time  . OLANZapine (ZYPREXA) 20 MG tablet Take 20 mg by mouth at bedtime.   2 10/30/2018 at Unknown time  . PREVIDENT 5000 BOOSTER PLUS 1.1 % PSTE Take 1 application by mouth 2 (two) times daily.  2 10/31/2018 at Unknown time  . propranolol (INDERAL) 20 MG tablet Take 1 tablet (20 mg total) by mouth 3 (three) times daily. (Patient not taking: Reported on 10/31/2018) 90 tablet 0 Not Taking at Unknown time  . propranolol ER (INDERAL LA) 60 MG 24 hr capsule Take 60 mg by mouth daily.  2 10/31/2018 at 1030  . QUEtiapine (SEROQUEL XR) 300 MG 24 hr  tablet Take 2 tablets (600 mg total) by mouth at bedtime. (Patient not taking: Reported on 07/11/2017) 60 tablet 0 Not Taking at Unknown time    Patient Stressors:  Aging father, health issues  Patient Strengths:  Strong communicator at baseline  Treatment Modalities: Medication Management, Group therapy, Case management,  1 to 1 session with clinician, Psychoeducation, Recreational therapy.   Physician Treatment Plan for Primary Diagnosis: Bipolar disorder, most recent episode depressed ([place])  Medication Management: Evaluate patient's response, side effects, and tolerance of medication regimen.  Therapeutic Interventions: 1 to 1 sessions, Unit Group sessions and Medication administration.  Evaluation of Outcomes: Progressing  Physician Treatment Plan for Secondary Diagnosis: Principal Problem:   Bipolar disorder, most recent episode depressed ([place]) Active Problems:   Bipolar affective disorder, current episode mixed ([place])   Medication Management: Evaluate patient's response, side effects, and tolerance of medication regimen.  Therapeutic Interventions: 1 to 1 sessions, Unit Group sessions and Medication administration.  Evaluation of Outcomes: Progressing   RN Treatment Plan for Primary Diagnosis: Bipolar disorder, most recent episode depressed ([place]) Long Term Goal(s): Knowledge of disease and therapeutic regimen to maintain health will improve  Short Term Goals: Ability to verbalize frustration and anger appropriately will improve, Ability to demonstrate self-control, Ability to participate in decision making will improve, Ability to verbalize feelings will improve and Compliance with prescribed medications will improve  Medication Management: RN will administer medications as ordered by provider, will assess and evaluate patient's response and provide education to patient for prescribed medication. RN will report any adverse and/or side effects to prescribing  provider.  Therapeutic Interventions: 1 on 1 counseling sessions, Psychoeducation, Medication administration, Evaluate responses to treatment, Monitor vital signs and CBGs as ordered, Perform/monitor CIWA, COWS, AIMS and Fall Risk screenings as ordered, Perform wound care treatments as ordered.  Evaluation of Outcomes: Progressing   LCSW Treatment Plan for Primary Diagnosis: Bipolar disorder, most recent episode depressed ([place]) Long Term Goal(s): Safe transition to appropriate next level of care at discharge, Engage patient in therapeutic group addressing interpersonal concerns.  Short Term Goals: Engage patient in aftercare planning with referrals and resources, Increase social support, Increase ability to appropriately verbalize feelings, Increase emotional regulation and Increase skills for wellness and recovery  Therapeutic Interventions: Assess for all discharge needs, 1 to 1 time with Social worker, Explore available resources and support systems, Assess for adequacy in community support network, Educate family and significant other(s) on suicide prevention, Complete Psychosocial Assessment, Interpersonal group therapy.  Evaluation of Outcomes: Progressing   Progress in Treatment: Attending groups: Yes. Participating in groups: Yes. Taking medication as prescribed: Yes.  Toleration medication: Yes. Family/Significant other contact made: Yes, individual(s) contacted:  sister, [name] Patient understands diagnosis: Yes. Discussing patient identified problems/goals with staff: Yes. Medical problems stabilized or resolved: No. Denies suicidal/homicidal ideation: Yes. Issues/concerns per patient self-inventory: Yes.  New problem(s) identified: Yes, Describe:  patient still experiencing thought blocking, further medication adjustments required  New Short Term/Long Term Goal(s): medication management for mood stabilization; elimination of SI thoughts; development of comprehensive mental  wellness/sobriety plan.  Patient Goals:  "To work on some things. I know what I need to work on."  Discharge Plan or Barriers: Likely discharging home with family.  Patient lives with her father and sister. She follows up with [facility]. Patient considering following up with [facility] for additional services. [place] pamphlet, Mobile Crisis information, and information provided to patient for additional community support and resources. CSW will continue to follow for additional referrals and possible discharge planning.   Reason for Continuation of Hospitalization: Anxiety Depression Medication stabilization Other; describe Thought blocking  Estimated Length of Stay: 3-5 days  Attendees: Patient: Nery Kalisz 11/13/2018 8:53 AM  Physician: Dr.Farah; Dr. Myles Lipps, MD 11/13/2018 8:53 AM  Nursing: Yetta Flock.[place].Loletha Grayer RN 11/13/2018 8:53 AM  RN Care Manager: Lars Pinks, RN 11/13/2018 8:53 AM  Social Worker: Stephanie Acre, [place]; [place], [place] 11/13/2018 8:53 AM  Recreational Therapist:  11/13/2018 8:53 AM  Other: Agustina Caroli, NP 11/13/2018 8:53 AM  Other:  11/13/2018 8:53 AM  Other: 11/13/2018 8:53 AM    Scribe for Treatment Team: Marylee Floras, [place] 11/13/2018 8:53 AM

## 2018-11-13 NOTE — Progress Notes (Signed)
Patient ID: Paula Dickson, female   DOB: 1966/03/25, 52 y.o.   MRN: [id number]  1:1 Nursing Progress Note  D: Patient is observed resting in bed with sitter present per [place]. Patient has been provided morning medications and reports she ate most of her breakfast. Patient is encouraged to use walker today, pending OT consult. Patient denies SI/HI/AVH or pain. Patient does present with some thought blocking this morning but her verbal communication is much improved since yesterday. Environment is secured.  A: Patient remains on 1:1 observation per provider orders. High fall risk precautions in place. Patient safety monitored with q15 minute safety checks. Medications provided per MD order.  R: Patient remains safe on the unit at this time. Will continue to monitor with 1:1 sitter, q15 min safety checks & q4 hour nursing assessments.

## 2018-11-13 NOTE — Progress Notes (Signed)
Patient ID: Paula Dickson, female   DOB: Sep 30, 1966, 52 y.o.   MRN: [id number] PER STATE REGULATIONS 482.30  THIS CHART WAS REVIEWED FOR MEDICAL NECESSITY WITH RESPECT TO THE PATIENT'S ADMISSION/ DURATION OF STAY.  NEXT REVIEW DATE: 11/17/2018 Chauncy Lean, RN, BSN CASE MANAGER

## 2018-11-13 NOTE — Therapy (Signed)
Occupational Therapy Group Note  Date:  11/13/2018 Time:  4:17 PM  Group Topic/Focus:  Self Esteem Action Plan:   The focus of this group is to help patients create a plan to continue to build self-esteem after discharge.  Participation Level:  Active  Participation Quality:  Appropriate  Affect:  Flat  Cognitive:  Alert  Insight: Improving  Engagement in Group:  Engaged  Modes of Intervention:  Activity, Discussion, Education and Socialization  Additional Comments:    S: "You just need integrity"  O:Education given on self esteem and its relation to mental health. Pt to brainstorm negative vs positive self esteem factors. "Wear your mask" activity completed to have pts to draw what their "mask" looks like vs when it is taken off. Reflection and discussion to be done at completion of activities.  A: Pt presents to group with flat affect, difficulty formulating sentences. Pt engaged in discussion at the best of her ability by stating that she needs integrity. Pt completed art activity, doing best to explain well but difficulty putting thoughts together.  P: Handouts given at end of session to facilitate carryover into community. OT groups to continue while pt inpatient.  Zenovia Jarred, MSOT, OTR/L Behavioral Health OT/ Acute Relief OT PHP Office: [place] 11/13/2018, 4:17 PM

## 2018-11-13 NOTE — Progress Notes (Signed)
Nursing 1:1 Note  D: Pt observed resting in bed with eyes closed. Respirations even and unlabored. No distress noted.  A: 1:1 observation continues for safety. Sitter within arms reach.  R: Pt remains safe at this time.  

## 2018-11-13 NOTE — Progress Notes (Signed)
[facility] MD Progress Note  11/13/2018 11:24 AM Jazilyn Siegenthaler  MRN:  [id number] Subjective:  Patient is seen and examined.  Patient is a 52 year old female with a past psychiatric history significant for schizoaffective disorder versus bipolar disorder.  She was admitted on 11/02/2018.  Objective: Patient is seen and examined.  Patient is a 52 year old female with the above-stated past psychiatric history who is seen in follow-up.  She actually looks a little bit better today.  She is more verbal.  She does have a tremor.  She is very nihilistic, and feels like she is "done wrong to everybody".  She states she has not been "very nice to anyone".  Her orientation is a bit better today.  She did state that it was "2020", but at least it was closer to 2019 than yesterday.  She did know she was in [place], and she did know she was in a hospital.  Her vital signs are stable, she is afebrile.  She only slept 4 hours last night.  Her ultrasound on the thyroid is still pending, and her TSI is still pending as well.  Principal Problem: Bipolar disorder, most recent episode depressed ([place]) Diagnosis: Principal Problem:   Bipolar disorder, most recent episode depressed ([place]) Active Problems:   Bipolar affective disorder, current episode mixed ([place])  Total Time spent with patient: 20 minutes  Past Psychiatric History: See admission H&P  Past Medical History:  Past Medical History:  Diagnosis Date  . Bipolar affective disorder ([place])   . History of arthritis   . History of chicken pox   . History of depression   . History of genital warts   . history of heart murmur   . History of high blood pressure   . History of thyroid disease   . History of UTI   . Hypertension   . Low TSH level 07/13/2017  . Schizophrenia [facility])     Past Surgical History:  Procedure Laterality Date  . ABLATION ON ENDOMETRIOSIS    . CYST REMOVAL NECK     around 11 years ago /benign  . MULTIPLE TOOTH EXTRACTIONS     Family  History:  Family History  Problem Relation Age of Onset  . Arthritis Father   . Hyperlipidemia Father   . High blood pressure Father   . Diabetes Sister   . Diabetes Mother   . Diabetes Brother   . Mental illness Brother   . Alcohol abuse Paternal Uncle   . Alcohol abuse Paternal Grandfather   . Breast cancer Maternal Aunt   . Breast cancer Paternal Aunt   . High blood pressure Sister   . Mental illness Other        runs in family   Family Psychiatric  History: See admission H&P Social History:  Social History   Substance and Sexual Activity  Alcohol Use No     Social History   Substance and Sexual Activity  Drug Use No    Social History   Socioeconomic History  . Marital status: Single    Spouse name: Not on file  . Number of children: Not on file  . Years of education: Not on file  . Highest education level: Not on file  Occupational History  . Not on file  Social Needs  . Financial resource strain: Not on file  . Food insecurity:    Worry: Not on file    Inability: Not on file  . Transportation needs:    Medical: Not on file  Non-medical: Not on file  Tobacco Use  . Smoking status: Never Smoker  . Smokeless tobacco: Never Used  Substance and Sexual Activity  . Alcohol use: No  . Drug use: No  . Sexual activity: Not Currently  Lifestyle  . Physical activity:    Days per week: Not on file    Minutes per session: Not on file  . Stress: Not on file  Relationships  . Social connections:    Talks on phone: Not on file    Gets together: Not on file    Attends religious service: Not on file    Active member of club or organization: Not on file    Attends meetings of clubs or organizations: Not on file    Relationship status: Not on file  Other Topics Concern  . Not on file  Social History Narrative  . Not on file   Additional Social History:                         Sleep: Poor  Appetite:  Fair  Current Medications: Current  Facility-Administered Medications  Medication Dose Route Frequency Provider Last Rate Last Dose  . acetaminophen (TYLENOL) tablet 650 mg  650 mg Oral Q6H PRN Ethelene Hal, NP      . alum & mag hydroxide-simeth (MAALOX/MYLANTA) 200-200-20 MG/5ML suspension 30 mL  30 mL Oral Q4H PRN Ethelene Hal, NP      . amLODipine (NORVASC) tablet 5 mg  5 mg Oral Daily Ethelene Hal, NP   5 mg at 11/13/18 0759  . antiseptic oral rinse (BIOTENE) solution 15 mL  15 mL Mouth Rinse PRN Lindon Romp A, NP      . benztropine (COGENTIN) tablet 0.5 mg  0.5 mg Oral BID Johnn Hai, MD   0.5 mg at 11/13/18 0759  . feeding supplement (ENSURE ENLIVE) (ENSURE ENLIVE) liquid 237 mL  237 mL Oral BID BM Sharma Covert, MD   237 mL at 11/12/18 1200  . hydrOXYzine (ATARAX/VISTARIL) tablet 25 mg  25 mg Oral TID PRN Ethelene Hal, NP   25 mg at 11/10/18 2119  . lithium carbonate capsule 300 mg  300 mg Oral BID WC Sharma Covert, MD      . LORazepam (ATIVAN) tablet 0.5 mg  0.5 mg Oral TID Sharma Covert, MD   0.5 mg at 11/13/18 0759  . magnesium hydroxide (MILK OF MAGNESIA) suspension 30 mL  30 mL Oral Daily PRN Ethelene Hal, NP      . omega-3 acid ethyl esters (LOVAZA) capsule 1 g  1 g Oral BID Johnn Hai, MD   1 g at 11/12/18 1712  . prenatal multivitamin tablet 1 tablet  1 tablet Oral Q1200 Johnn Hai, MD   1 tablet at 11/12/18 1712  . propranolol ER (INDERAL LA) 24 hr capsule 80 mg  80 mg Oral Daily Johnn Hai, MD   80 mg at 11/13/18 0759  . QUEtiapine (SEROQUEL) tablet 100 mg  100 mg Oral QHS Sharma Covert, MD        Lab Results:  Results for orders placed or performed during the hospital encounter of 11/01/18 (from the past 48 hour(s))  Comprehensive metabolic panel     Status: Abnormal   Collection Time: 11/11/18  6:26 PM  Result Value Ref Range   Sodium 140 135 - 145 mmol/L   Potassium 3.4 (L) 3.5 - 5.1 mmol/L   Chloride 101 98 -  111 mmol/L   CO2 28 22  - 32 mmol/L   Glucose, Bld 153 (H) 70 - 99 mg/dL   BUN 13 6 - 20 mg/dL   Creatinine, Ser 1.09 (H) 0.44 - 1.00 mg/dL   Calcium 10.3 8.9 - 10.3 mg/dL   Total Protein 7.4 6.5 - 8.1 g/dL   Albumin 4.4 3.5 - 5.0 g/dL   AST 19 15 - 41 U/L   ALT 17 0 - 44 U/L   Alkaline Phosphatase 60 38 - 126 U/L   Total Bilirubin 0.4 0.3 - 1.2 mg/dL   GFR calc non Af Amer 58 (L) >60 mL/min   GFR calc Af Amer >60 >60 mL/min   Anion gap 11 5 - 15    Comment: Performed at [facility], [place] [address] [place], [place] [address]  Urinalysis, Routine w reflex microscopic     Status: Abnormal   Collection Time: 11/12/18  3:53 PM  Result Value Ref Range   Color, Urine YELLOW YELLOW   APPearance CLEAR CLEAR   Specific Gravity, Urine 1.005 1.005 - 1.030   pH 6.0 5.0 - 8.0   Glucose, UA NEGATIVE NEGATIVE mg/dL   Hgb urine dipstick NEGATIVE NEGATIVE   Bilirubin Urine NEGATIVE NEGATIVE   Ketones, ur NEGATIVE NEGATIVE mg/dL   Protein, ur NEGATIVE NEGATIVE mg/dL   Nitrite NEGATIVE NEGATIVE   Leukocytes, UA SMALL (A) NEGATIVE   RBC / HPF 0-5 0 - 5 RBC/hpf   WBC, UA 11-20 0 - 5 WBC/hpf   Bacteria, UA RARE (A) NONE SEEN   Squamous Epithelial / LPF 0-5 0 - 5    Comment: Performed at [facility], [place] [address], [place], [place] [address]  TSH     Status: Abnormal   Collection Time: 11/13/18  6:42 AM  Result Value Ref Range   TSH 0.107 (L) 0.350 - 4.500 uIU/mL    Comment: Performed by a 3rd Generation assay with a functional sensitivity of <=0.01 uIU/mL. Performed at [facility], [place] [address], [place], [place] [address]     Blood Alcohol level:  Lab Results  Component Value Date   [facility] <10 10/31/2018   ETH <5 28/41/3244    Metabolic Disorder Labs: Lab Results  Component Value Date   HGBA1C 5.4 11/03/2018   MPG 108.28 11/03/2018   No results found for: PROLACTIN Lab Results  Component Value Date   CHOL 150 11/03/2018   TRIG 164 (H)  11/03/2018   HDL 41 11/03/2018   CHOLHDL 3.7 11/03/2018   VLDL 33 11/03/2018   LDLCALC 76 11/03/2018   LDLCALC 84 12/10/2015    Physical Findings: AIMS: Facial and Oral Movements Muscles of Facial Expression: None, normal Lips and Perioral Area: None, normal Jaw: None, normal Tongue: None, normal,Extremity Movements Upper (arms, wrists, hands, fingers): Mild Lower (legs, knees, ankles, toes): None, normal, Trunk Movements Neck, shoulders, hips: None, normal, Overall Severity Severity of abnormal movements (highest score from questions above): Minimal Incapacitation due to abnormal movements: None, normal Patient's awareness of abnormal movements (rate only patient's report): Aware, no distress, Dental Status Current problems with teeth and/or dentures?: Yes Does patient usually wear dentures?: Yes  CIWA:    COWS:     Musculoskeletal: Strength & Muscle Tone: decreased Gait & Station: unsteady Patient leans: N/A  Psychiatric Specialty Exam: Physical Exam  Nursing note and vitals reviewed. Constitutional: She is oriented to person, place, and time. She appears well-developed and well-nourished.  HENT:  Head: Normocephalic  and atraumatic.  Respiratory: Effort normal.  Neurological: She is alert and oriented to person, place, and time.    ROS  Blood pressure 129/85, pulse 83, temperature 98.3 F (36.8 C), temperature source Oral, resp. rate 16, weight 68 kg, SpO2 100 %.Body mass index is 28.34 kg/m.  General Appearance: Casual  Eye Contact:  Fair  Speech:  Normal Rate  Volume:  Decreased  Mood:  Anxious  Affect:  Congruent  Thought Process:  Coherent and Descriptions of Associations: Intact  Orientation:  Other:  Oriented to hospital, approximately a year, and self.  Thought Content:  Delusions  Suicidal Thoughts:  No  Homicidal Thoughts:  No  Memory:  Immediate;   Poor Recent;   Poor Remote;   Poor  Judgement:  Impaired  Insight:  Fair  Psychomotor Activity:   Increased  Concentration:  Concentration: Fair and Attention Span: Fair  Recall:  [employer] of Knowledge:  Fair  Language:  Good  Akathisia:  Negative  Handed:  Right  AIMS (if indicated):     Assets:  Desire for Improvement Housing Resilience  ADL's:  Impaired  Cognition:  Impaired,  Moderate  Sleep:  Number of Hours: 4     Treatment Plan Summary: Daily contact with patient to assess and evaluate symptoms and progress in treatment, Medication management and Plan : Patient is seen and examined.  Patient is a 52 year old female with a past psychiatric history significant for schizoaffective disorder; bipolar type versus bipolar disorder.  She is seen in follow-up.  She actually looks a little bit better today.  She is more verbal.  Review of the record showed that her dosage of lithium in the past had been as high as 450 mg p.o. twice daily.  I am going to increase that to 300 mg p.o. twice daily.  As well her sleep is poor.  She had previously been on somewhere between 400 800  mg of Seroquel.  I am going to start her back on Seroquel 100 mg p.o. nightly and titrate that.  Additionally her thyroid ultrasound is still pending, her TSI is pending.  We will continue her lorazepam at this dosage.  I have also asked for occupational therapy to see her to get her ambulating. 1.  Increase lithium carbonate to 300 mg p.o. twice daily for mood stability. 2.  Continue lorazepam 0.5 mg p.o. 3 times daily for catatonia as well as anxiety. 3.  Stop Trintellix 4.  Seroquel 100 mg p.o. nightly for mood stability and sleep. 5.  Continue amlodipine 5 mg p.o. daily for hypertension. 6.  Continue propranolol extended release 80 mg p.o. daily for hyperthyroidism and hypertension. 7.  Awaiting results of TSI and thyroid ultrasound. 8.  Occupational Therapy consultation. 9.  Disposition planning-in progress  Sharma Covert, MD 11/13/2018, 11:24 AM

## 2018-11-13 NOTE — Progress Notes (Addendum)
Nursing 1:1 Note  D: Pt observed resting in bed with eyes closed. Respirations even and unlabored. No distress noted.  A: 1:1 observation continues for safety. Sitter within arms reach.  R: Pt remains safe at this time.  

## 2018-11-14 MED ORDER — QUETIAPINE FUMARATE 50 MG PO TABS
50.0000 mg | ORAL_TABLET | Freq: Once | ORAL | Status: AC
  Administered 2018-11-14: 50 mg via ORAL
  Filled 2018-11-14 (×2): qty 1

## 2018-11-14 MED ORDER — QUETIAPINE FUMARATE 400 MG PO TABS
400.0000 mg | ORAL_TABLET | Freq: Every day | ORAL | Status: DC
  Administered 2018-11-14 – 2018-11-17 (×4): 400 mg via ORAL
  Filled 2018-11-14 (×6): qty 1

## 2018-11-14 NOTE — Progress Notes (Signed)
Adult Psychoeducational Group Note  Date:  11/14/2018 Time:  9:01 PM  Group Topic/Focus:  Wrap-Up Group:   The focus of this group is to help patients review their daily goal of treatment and discuss progress on daily workbooks.  Participation Level:  Active  Participation Quality:  Appropriate  Affect:  Appropriate  Cognitive:  Appropriate  Insight: Appropriate  Engagement in Group:  Engaged  Modes of Intervention:  Discussion  Additional Comments:  Patient attended group and said that her day was a 8.   Her coping skills for today were go to the gym, coloring and socializing. Her positive word for today is interesting.   Paula Dickson W Paula Dickson [id number]/3014, 9:01 PM

## 2018-11-14 NOTE — Progress Notes (Addendum)
Patient ID: Paula Dickson, female   DOB: [birth year], 52 y.o.   MRN: [id number]  Close Observation Nursing Progress Note  D: Patient is observed up in the milieu attending groups and ambulating unassisted. Patient denies SI/HI/AVH or pain. Patient does still seem ambivalent about taking medications but is compliant when reassured but writing. She does still require cueing by staff for direction. Patient has bathed, is eating her meals and is ambulating off the unit for meals/groups. Environment is secured. Sitter observed with patient.  A: Patient remains on close observation per provider orders. High fall risk precautions in place. Patient safety monitored with q15 minute safety checks. Patient medicated as ordered.  R: Patient remains safe on the unit at this time. Will continue to monitor with close observation sitter, q15 min safety checks & q4 hour nursing assessments.

## 2018-11-14 NOTE — Progress Notes (Addendum)
Patient ID: Paula Dickson, female   DOB: 19-Aug-1966, 52 y.o.   MRN: [id number]  Close Observation Nursing Progress Note  D: Patient is interacting in the milieu with peers, staff and sitter. Environment is secured. Sitter observed with patient.  A: Patient remains on close observation per provider orders. High fall risk precautions in place. Patient safety monitored with q15 minute safety checks.  R: Patient remains safe on the unit at this time. Will continue to monitor with close observation sitter, q15 min safety checks & q4 hour nursing assessments.

## 2018-11-14 NOTE — [facility] Group Notes (Signed)
Adult Psychoeducational Group Note  Date:  11/14/2018 Time:  4:17 PM  Group Topic/Focus:  Goals Group:   The focus of this group is to help patients establish daily goals to achieve during treatment and discuss how the patient can incorporate goal setting into their daily lives to aide in recovery.  Participation Level:  Active  Participation Quality:  Appropriate  Affect:  Appropriate  Cognitive:  Appropriate  Insight: Appropriate  Engagement in Group:  Engaged  Modes of Intervention:  Discussion  Additional Comments:  Pt did attend group, and actively participated.  Tonia Brooms D 11/14/2018, 4:17 PM

## 2018-11-14 NOTE — Progress Notes (Signed)
Nursing 1:1 note D:Pt observed lying  in bed with eyes closed, but not sleep . RR even and unlabored. No distress noted. A: 1:1 observation continues for safety  R: pt remains safe

## 2018-11-14 NOTE — Progress Notes (Signed)
At this time, pt is sitting in the dayroom watching TV and looking at a magazine.  She has a staff member with her, but is engaging minimally with staff.  She denies SI/HI/AVH at this time.  She seems somewhat anxious, but denies any needs or concerns.  Writer discussed her evening medications with pt and she voiced understanding at the time of shift assessment earlier.  Support and encouragement offered.  Discharge plans are in process.  Pt was encouraged to make her needs known to staff.  Pt is on close observation for safety.  Staff member with patient at this time.

## 2018-11-14 NOTE — Progress Notes (Signed)
[facility] MD Progress Note  11/14/2018 10:10 AM Paula Dickson  MRN:  [id number] Subjective:  Patient is seen and examined. Patient is a 52 year old female with a past psychiatric history significant for schizoaffective disorder versus bipolar disorder. She was admitted on 11/02/2018.  Objective: Patient is seen and examined.  Patient is a 52 year old female with the above-stated past psychiatric history who is seen in follow-up.  She is awake alert this morning.  She has had a shower, but is very anxious.  She is not as verbal as yesterday.  The Seroquel last night improved her sleep slightly.  She continued to endorse auditory hallucinations and she is very negative about these.  She continues to state that they are telling her that she is responsible for something.  That she is wrong, and it is her fault.  Her TSI came back at very low.  She is to get the nuclear study hopefully done today.  The ultrasound of her thyroid showed some evidence of a goiter, multiple nodules and some cysts.  Is unclear at this point its contribution to her psychiatric illness.  She again continues to have no cogwheeling on physical examination today.  She is afebrile.  Her blood pressure was 133/93, and her pulse was 77 this morning.  She slept 4.5 hours last night.  Principal Problem: Bipolar disorder, most recent episode depressed ([place]) Diagnosis: Principal Problem:   Bipolar disorder, most recent episode depressed ([place]) Active Problems:   Bipolar affective disorder, current episode mixed ([place])  Total Time spent with patient: 20 minutes  Past Psychiatric History: See admission H&P  Past Medical History:  Past Medical History:  Diagnosis Date  . Bipolar affective disorder ([place])   . History of arthritis   . History of chicken pox   . History of depression   . History of genital warts   . history of heart murmur   . History of high blood pressure   . History of thyroid disease   . History of UTI   .  Hypertension   . Low TSH level 07/13/2017  . Schizophrenia [facility])     Past Surgical History:  Procedure Laterality Date  . ABLATION ON ENDOMETRIOSIS    . CYST REMOVAL NECK     around 11 years ago /benign  . MULTIPLE TOOTH EXTRACTIONS     Family History:  Family History  Problem Relation Age of Onset  . Arthritis Father   . Hyperlipidemia Father   . High blood pressure Father   . Diabetes Sister   . Diabetes Mother   . Diabetes Brother   . Mental illness Brother   . Alcohol abuse Paternal Uncle   . Alcohol abuse Paternal Grandfather   . Breast cancer Maternal Aunt   . Breast cancer Paternal Aunt   . High blood pressure Sister   . Mental illness Other        runs in family   Family Psychiatric  History: See admission H&P Social History:  Social History   Substance and Sexual Activity  Alcohol Use No     Social History   Substance and Sexual Activity  Drug Use No    Social History   Socioeconomic History  . Marital status: Single    Spouse name: Not on file  . Number of children: Not on file  . Years of education: Not on file  . Highest education level: Not on file  Occupational History  . Not on file  Social Needs  . Financial  resource strain: Not on file  . Food insecurity:    Worry: Not on file    Inability: Not on file  . Transportation needs:    Medical: Not on file    Non-medical: Not on file  Tobacco Use  . Smoking status: Never Smoker  . Smokeless tobacco: Never Used  Substance and Sexual Activity  . Alcohol use: No  . Drug use: No  . Sexual activity: Not Currently  Lifestyle  . Physical activity:    Days per week: Not on file    Minutes per session: Not on file  . Stress: Not on file  Relationships  . Social connections:    Talks on phone: Not on file    Gets together: Not on file    Attends religious service: Not on file    Active member of club or organization: Not on file    Attends meetings of clubs or organizations: Not on file     Relationship status: Not on file  Other Topics Concern  . Not on file  Social History Narrative  . Not on file   Additional Social History:                         Sleep: Fair  Appetite:  Fair  Current Medications: Current Facility-Administered Medications  Medication Dose Route Frequency Provider Last Rate Last Dose  . acetaminophen (TYLENOL) tablet 650 mg  650 mg Oral Q6H PRN Ethelene Hal, NP      . alum & mag hydroxide-simeth (MAALOX/MYLANTA) 200-200-20 MG/5ML suspension 30 mL  30 mL Oral Q4H PRN Ethelene Hal, NP      . amLODipine (NORVASC) tablet 5 mg  5 mg Oral Daily Ethelene Hal, NP   5 mg at 11/14/18 0911  . antiseptic oral rinse (BIOTENE) solution 15 mL  15 mL Mouth Rinse PRN Lindon Romp A, NP      . benztropine (COGENTIN) tablet 0.5 mg  0.5 mg Oral BID Johnn Hai, MD   0.5 mg at 11/14/18 0910  . feeding supplement (ENSURE ENLIVE) (ENSURE ENLIVE) liquid 237 mL  237 mL Oral BID BM Sharma Covert, MD   237 mL at 11/14/18 0916  . hydrOXYzine (ATARAX/VISTARIL) tablet 25 mg  25 mg Oral TID PRN Ethelene Hal, NP   25 mg at 11/10/18 2119  . lithium carbonate capsule 300 mg  300 mg Oral BID WC Sharma Covert, MD   300 mg at 11/14/18 0910  . LORazepam (ATIVAN) tablet 0.5 mg  0.5 mg Oral TID Sharma Covert, MD   0.5 mg at 11/14/18 0910  . magnesium hydroxide (MILK OF MAGNESIA) suspension 30 mL  30 mL Oral Daily PRN Ethelene Hal, NP      . omega-3 acid ethyl esters (LOVAZA) capsule 1 g  1 g Oral BID Johnn Hai, MD   1 g at 11/14/18 0911  . prenatal multivitamin tablet 1 tablet  1 tablet Oral Q1200 Johnn Hai, MD   1 tablet at 11/13/18 1448  . propranolol ER (INDERAL LA) 24 hr capsule 80 mg  80 mg Oral Daily Johnn Hai, MD   80 mg at 11/14/18 0911  . QUEtiapine (SEROQUEL) tablet 400 mg  400 mg Oral QHS Sharma Covert, MD      . QUEtiapine (SEROQUEL) tablet 50 mg  50 mg Oral Once Sharma Covert, MD         Lab Results:  Results  for orders placed or performed during the hospital encounter of 11/01/18 (from the past 48 hour(s))  Urinalysis, Routine w reflex microscopic     Status: Abnormal   Collection Time: 11/12/18  3:53 PM  Result Value Ref Range   Color, Urine YELLOW YELLOW   APPearance CLEAR CLEAR   Specific Gravity, Urine 1.005 1.005 - 1.030   pH 6.0 5.0 - 8.0   Glucose, UA NEGATIVE NEGATIVE mg/dL   Hgb urine dipstick NEGATIVE NEGATIVE   Bilirubin Urine NEGATIVE NEGATIVE   Ketones, ur NEGATIVE NEGATIVE mg/dL   Protein, ur NEGATIVE NEGATIVE mg/dL   Nitrite NEGATIVE NEGATIVE   Leukocytes, UA SMALL (A) NEGATIVE   RBC / HPF 0-5 0 - 5 RBC/hpf   WBC, UA 11-20 0 - 5 WBC/hpf   Bacteria, UA RARE (A) NONE SEEN   Squamous Epithelial / LPF 0-5 0 - 5    Comment: Performed at [facility], [place] [address], [place], [place] [address]  Thyroid stimulating immunoglobulin     Status: None   Collection Time: 11/12/18  7:02 PM  Result Value Ref Range   Thyroid Stimulating Immunoglob <0.10 0.00 - 0.55 IU/L    Comment: (NOTE) Performed At: [facility] [address] [place], [place] [id number] Rush Farmer MD [place]:[id number]   TSH     Status: Abnormal   Collection Time: 11/13/18  6:42 AM  Result Value Ref Range   TSH 0.107 (L) 0.350 - 4.500 uIU/mL    Comment: Performed by a 3rd Generation assay with a functional sensitivity of <=0.01 uIU/mL. Performed at [facility], [place] [address], [place], [place] [address]     Blood Alcohol level:  Lab Results  Component Value Date   [facility] <10 10/31/2018   ETH <5 41/96/2229    Metabolic Disorder Labs: Lab Results  Component Value Date   HGBA1C 5.4 11/03/2018   MPG 108.28 11/03/2018   No results found for: PROLACTIN Lab Results  Component Value Date   CHOL 150 11/03/2018   TRIG 164 (H) 11/03/2018   HDL 41 11/03/2018   CHOLHDL 3.7 11/03/2018   VLDL 33 11/03/2018   LDLCALC 76 11/03/2018    LDLCALC 84 12/10/2015    Physical Findings: AIMS: Facial and Oral Movements Muscles of Facial Expression: None, normal Lips and Perioral Area: None, normal Jaw: None, normal Tongue: None, normal,Extremity Movements Upper (arms, wrists, hands, fingers): Minimal Lower (legs, knees, ankles, toes): None, normal, Trunk Movements Neck, shoulders, hips: None, normal, Overall Severity Severity of abnormal movements (highest score from questions above): Minimal Incapacitation due to abnormal movements: None, normal Patient's awareness of abnormal movements (rate only patient's report): Aware, no distress, Dental Status Current problems with teeth and/or dentures?: Yes Does patient usually wear dentures?: Yes  CIWA:    COWS:     Musculoskeletal: Strength & Muscle Tone: decreased Gait & Station: unsteady Patient leans: N/A  Psychiatric Specialty Exam: Physical Exam  Nursing note and vitals reviewed. Constitutional: She appears well-developed and well-nourished.  HENT:  Head: Normocephalic and atraumatic.  Respiratory: Effort normal.  Neurological: She is alert.    ROS  Blood pressure (!) 133/93, pulse 77, temperature 98.6 F (37 C), temperature source Oral, resp. rate 16, weight 68 kg, SpO2 100 %.Body mass index is 28.34 kg/m.  General Appearance: Casual  Eye Contact:  Fair  Speech:  Normal Rate  Volume:  Decreased  Mood:  Anxious, Depressed and Dysphoric  Affect:  Congruent  Thought Process:  Coherent and Descriptions of  Associations: Circumstantial  Orientation:  NA  Thought Content:  Paranoid Ideation  Suicidal Thoughts:  No  Homicidal Thoughts:  No  Memory:  Immediate;   Poor Recent;   Poor Remote;   Poor  Judgement:  Impaired  Insight:  Fair  Psychomotor Activity:  Increased  Concentration:  Concentration: Fair and Attention Span: Poor  Recall:  Poor  Fund of Knowledge:  Fair  Language:  Fair  Akathisia:  Negative  Handed:  Right  AIMS (if indicated):      Assets:  Desire for Improvement Housing Resilience  ADL's:  Intact  Cognition:  WNL  Sleep:  Number of Hours: 4.5     Treatment Plan Summary: Daily contact with patient to assess and evaluate symptoms and progress in treatment, Medication management and Plan : Patient is seen and examined.  Patient is a 52 year old female with a past psychiatric history significant for schizoaffective disorder; bipolar type versus bipolar disorder.  She is seen in follow-up.  Patient is essentially unchanged from yesterday.  The addition of the Seroquel did get her a bit more sleep last night.  She remains tremulous, but without cogwheeling.  She did admit to auditory hallucinations, and they are very negative about her.  They are blaming her for things.  She feels guilty about this.  Her TSI came back at very low, and her nuclear thyroid study is pending.  Physical therapy/Occupational Therapy saw her yesterday and she ambulated well, and her one-to-one is been decreased to close observation.  Her anxiety is a little bit higher than yesterday, and that may have been secondary to stopping the Trintellix.  I may have to re-add that. 1.  Continue lithium carbonate 300 mg p.o. twice daily for mood stability, will probably obtain level in 1 to 2 days. 2.  Continue lorazepam 0.5 mg p.o. 3 times daily for catatonia as well as anxiety. 3.  Increase Seroquel to 400 mg p.o. nightly for mood stability and sleep. 4.  Continue amlodipine 5 mg p.o. daily for hypertension. 5.  Continue propranolol extended release 80 mg p.o. daily for hyperthyroidism and hypertension. 6.  Awaiting results of thyroid nuclear study. 7.  Consideration of re-adding Trintellix. 8.  Disposition planning-in progress.  Sharma Covert, MD 11/14/2018, 10:10 AM

## 2018-11-14 NOTE — Progress Notes (Signed)
Nursing 1:1 note D:Pt observed sleeping in bed with eyes closed. RR even and unlabored. No distress noted. A: 1:1 observation continues for safety  R: pt remains safe  

## 2018-11-15 ENCOUNTER — Ambulatory Visit (HOSPITAL_COMMUNITY): Admission: AD | Admit: 2018-11-15 | Payer: Medicare Other | Admitting: Psychiatry

## 2018-11-15 ENCOUNTER — Encounter (HOSPITAL_COMMUNITY): Payer: Self-pay

## 2018-11-15 ENCOUNTER — Inpatient Hospital Stay (HOSPITAL_COMMUNITY): Payer: Medicare Other

## 2018-11-15 NOTE — Progress Notes (Addendum)
D: Patient observed sitting in dayroom under observation of designated MHT. Patient states "what is the plan? I don't know the plan. I am worried about things that happened before I came here. I need to make ammends." Patient's affect flat, anxious, mood congruent. Denies pain, physical complaints. Gait remains cautious, slow, unsteady at times but improving.   A: Medicated per orders, no prns requested or required. Medication education provided. Level I obs in place for safety. Emotional support offered. Encouraged to attend and participate in unit programming. Fall prevention plan in place and reviewed with patient as pt is a high fall risk.   R: Patient verbalizes understanding of POC, falls prevention education. Patient denies SI/HI/AVH and remains safe on level I obs. Will continue to monitor throughout the night.

## 2018-11-15 NOTE — Progress Notes (Signed)
Patient ID: Paula Dickson, female   DOB: 12-30-1965, 52 y.o.   MRN: [id number]  1:1-   D: Pt currently standing at the nurses station speaking with staff. Sitter is currently by patients side. Respirations even and unlabored.  A: Emotional support given, pt given opportunity to express concerns. Sitter encouraged to share any pertinent observations.   R: Pt remains safe on a 1:1 per MD orders. Pt requests extra toothpaste. Reports she has had her braced on for 2 years but owes on them because she didn't read the fine print. Pt in no current distress. Will continue to monitor.

## 2018-11-15 NOTE — Progress Notes (Signed)
Adult Psychoeducational Group Note  Date:  11/15/2018 Time:  8:48 PM  Group Topic/Focus:  Wrap-Up Group:   The focus of this group is to help patients review their daily goal of treatment and discuss progress on daily workbooks.  Participation Level:  Active  Participation Quality:  Appropriate  Affect:  Appropriate  Cognitive:  Appropriate  Insight: Appropriate  Engagement in Group:  Engaged  Modes of Intervention:  Discussion  Additional Comments:  Patient attended wrap-up group and participated.   Samiyah Stupka W [place] 45/85/[id number], 8:48 PM

## 2018-11-15 NOTE — Progress Notes (Addendum)
Patient ID: Paula Dickson, female   DOB: 1966/07/21, 52 y.o.   MRN: [id number]  Pt currently presents with a blunted affect and anxious, guarded behavior. Pt is confused this morning, trepidatious about taking medications. Pt denies any concerns but when asked if she is okay, pt says "no." Does not elaborate further. Pt was started on new sleep medication regimen and per report pt slept well.   Pt provided with medications per providers orders. Pt's labs and vitals were monitored throughout the night. Pt supported emotionally and encouraged to express concerns and questions. Pt educated on medications.  Pt's safety ensured with 15 minute and environmental checks. Pt remains on a 1:1 for safety.MD aware of patients current mental state. Will continue POC.

## 2018-11-15 NOTE — Progress Notes (Signed)
CSW contacted the patient's sister, Adelyne Marchese [phone]) to discuss the patient's tentative discharge plans and to discuss the treatment team's concerns with the patient returning home without assistance.   Ms. [name] reported that she was currently traveling to [place], [place] to attend the [employer] to support [employer] in the state championship and that she would call to speak with the weekend CSW tomorrow morning.   CSW will notify weekend staff to ensure staff follows up with ensuring a safe discharge for the patient.    Radonna Ricker, MSW, [place] Worker [facility]  Phone: [phone]

## 2018-11-15 NOTE — Progress Notes (Signed)
Patient ID: Paula Dickson, female   DOB: 1966/08/30, 52 y.o.   MRN: [id number]  1:1-   D: Pt currently sitting in the dayroom watching tv. Sitter is currently at patients side. Respirations even and unlabored. Pt cognition clearer this afternoon.  A: Emotional support given, pt given opportunity to express concerns. Sitter encouraged to share any pertinent observations.  R: Pt remains safe on a 1:1 per MD orders. Pt in no current distress. Pt asks Probation officer when she thinks she can leave. Pt also has concerns about the financial burden of her admission. Will continue to monitor.

## 2018-11-15 NOTE — Progress Notes (Addendum)
CO progress note:  Pt resting in bed with eyes closed.  No distress observed.  Respirations even and unlabored.  Continue pt on close observation for safety d/t unsteadiness.  Sitter in room with patient.  Safety is maintained at this time.

## 2018-11-15 NOTE — Progress Notes (Signed)
Recreation Therapy Notes  Date: 12.20.19 Time: 0930 Location: 300 Hall Dayroom  Group Topic: Stress Management  Goal Area(s) Addresses:  Patient will verbalize importance of using healthy stress management.  Patient will identify positive emotions associated with healthy stress management.   Intervention: Stress Management  Activity :  Guided Imagery.  LRT introduced the stress management technique of guided imagery.  LRT read a script on sitting and observing the starry sky at night.  Patients were to follow along as script as read.  Education:  Stress Management, Discharge Planning.   Education Outcome: Acknowledges edcuation/In group clarification offered/Needs additional education  Clinical Observations/Feedback: Pt did not attend group.     [name], LRT/CTRS         [name] A 11/15/2018 11:56 AM

## 2018-11-15 NOTE — Progress Notes (Signed)
CO progress note:  Pt resting in bed with eyes closed.  No distress observed.  Respirations even and unlabored.  Continue with close observation d/t pt's unsteady gait and confusion.  Staff near bedside.  Pt is safe at this time.

## 2018-11-15 NOTE — Progress Notes (Signed)
Patient ID: Paula Dickson, female   DOB: [birth year], 52 y.o.   MRN: [id number]  1:1-   D: Pt currently lying in bed with eyes open. Sitter is currently at pt bedside. Respirations even and unlabored. A: Emotional support given, pt given opportunity to express concerns. Sitter encouraged to share any pertinent observations. Pt notified of MDs order for CT scan today, tolerates news well.  R: Pt remains safe on a 1:1 per MD orders. Pt in no current distress. Will continue to monitor.

## 2018-11-15 NOTE — Progress Notes (Signed)
[facility] MD Progress Note  11/15/2018 9:57 AM Mitchelle Sultan  MRN:  [id number] Subjective:  Patient is seen and examined. Patient is a 52 year old female with a past psychiatric history significant for schizoaffective disorder versus bipolar disorder. She was admitted on 11/02/2018.  Objective: Patient is seen and examined.  Patient is a 52 year old female with the above-stated past psychiatric history who is seen in follow-up.  Mornings are really difficult for her.  She stays in bed, and is very anxious.  But as the day goes along she improves and ambulates and does well.  She slept better last night with the increase Seroquel, but early this morning she still somewhat agitated.  She was alert and oriented x3.  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  I am concerned about underlying dementia and her.  She actually had a CT scan of the head done because of confusion in 2018.  That scan did not show any major issues going.  Her laboratories suggest a subclinical hyperthyroidism.  The nuclear scan had to be delayed because it is a several day's study, and they were unable to get the nucleotide for the weekend.  Otherwise her mental status examination is unchanged.  I have asked social work to call her sister who is the primary caretaker of her although she does not live with her sister.  I cannot imagine the patient being able to take her own medicines and be compliant with them given her mental status during the course of the hospitalization.  She remains afebrile.  Her vital signs are stable this morning.  Nursing notes reflect that she slept 5.5 hours last night.  Principal Problem: Bipolar disorder, most recent episode depressed ([place]) Diagnosis: Principal Problem:   Bipolar disorder, most recent episode depressed ([place]) Active Problems:   Bipolar affective disorder, current episode mixed ([place])  Total Time spent with patient: 30 minutes  Past Psychiatric History: Admission H&P  Past  Medical History:  Past Medical History:  Diagnosis Date  . Bipolar affective disorder ([place])   . History of arthritis   . History of chicken pox   . History of depression   . History of genital warts   . history of heart murmur   . History of high blood pressure   . History of thyroid disease   . History of UTI   . Hypertension   . Low TSH level 07/13/2017  . Schizophrenia [facility])     Past Surgical History:  Procedure Laterality Date  . ABLATION ON ENDOMETRIOSIS    . CYST REMOVAL NECK     around 11 years ago /benign  . MULTIPLE TOOTH EXTRACTIONS     Family History:  Family History  Problem Relation Age of Onset  . Arthritis Father   . Hyperlipidemia Father   . High blood pressure Father   . Diabetes Sister   . Diabetes Mother   . Diabetes Brother   . Mental illness Brother   . Alcohol abuse Paternal Uncle   . Alcohol abuse Paternal Grandfather   . Breast cancer Maternal Aunt   . Breast cancer Paternal Aunt   . High blood pressure Sister   . Mental illness Other        runs in family   Family Psychiatric  History: See admission H&P Social History:  Social History   Substance and Sexual Activity  Alcohol Use No     Social History   Substance and Sexual Activity  Drug Use No  Social History   Socioeconomic History  . Marital status: Single    Spouse name: Not on file  . Number of children: Not on file  . Years of education: Not on file  . Highest education level: Not on file  Occupational History  . Not on file  Social Needs  . Financial resource strain: Not on file  . Food insecurity:    Worry: Not on file    Inability: Not on file  . Transportation needs:    Medical: Not on file    Non-medical: Not on file  Tobacco Use  . Smoking status: Never Smoker  . Smokeless tobacco: Never Used  Substance and Sexual Activity  . Alcohol use: No  . Drug use: No  . Sexual activity: Not Currently  Lifestyle  . Physical activity:    Days per week: Not on  file    Minutes per session: Not on file  . Stress: Not on file  Relationships  . Social connections:    Talks on phone: Not on file    Gets together: Not on file    Attends religious service: Not on file    Active member of club or organization: Not on file    Attends meetings of clubs or organizations: Not on file    Relationship status: Not on file  Other Topics Concern  . Not on file  Social History Narrative  . Not on file   Additional Social History:                         Sleep: Fair  Appetite:  Fair  Current Medications: Current Facility-Administered Medications  Medication Dose Route Frequency Provider Last Rate Last Dose  . acetaminophen (TYLENOL) tablet 650 mg  650 mg Oral Q6H PRN Ethelene Hal, NP      . alum & mag hydroxide-simeth (MAALOX/MYLANTA) 200-200-20 MG/5ML suspension 30 mL  30 mL Oral Q4H PRN Ethelene Hal, NP      . amLODipine (NORVASC) tablet 5 mg  5 mg Oral Daily Ethelene Hal, NP   5 mg at 11/15/18 0350  . antiseptic oral rinse (BIOTENE) solution 15 mL  15 mL Mouth Rinse PRN Lindon Romp A, NP      . benztropine (COGENTIN) tablet 0.5 mg  0.5 mg Oral BID Johnn Hai, MD   0.5 mg at 11/15/18 0938  . feeding supplement (ENSURE ENLIVE) (ENSURE ENLIVE) liquid 237 mL  237 mL Oral BID BM Sharma Covert, MD   237 mL at 11/14/18 0916  . hydrOXYzine (ATARAX/VISTARIL) tablet 25 mg  25 mg Oral TID PRN Ethelene Hal, NP   25 mg at 11/10/18 2119  . lithium carbonate capsule 300 mg  300 mg Oral BID WC Sharma Covert, MD   300 mg at 11/15/18 1829  . LORazepam (ATIVAN) tablet 0.5 mg  0.5 mg Oral TID Sharma Covert, MD   0.5 mg at 11/15/18 9371  . magnesium hydroxide (MILK OF MAGNESIA) suspension 30 mL  30 mL Oral Daily PRN Ethelene Hal, NP      . omega-3 acid ethyl esters (LOVAZA) capsule 1 g  1 g Oral BID Johnn Hai, MD   1 g at 11/15/18 [phone]  . prenatal multivitamin tablet 1 tablet  1 tablet Oral Q1200  Johnn Hai, MD   1 tablet at 11/14/18 1155  . propranolol ER (INDERAL LA) 24 hr capsule 80 mg  80 mg Oral Daily  Johnn Hai, MD   80 mg at 11/15/18 [phone]  . QUEtiapine (SEROQUEL) tablet 400 mg  400 mg Oral QHS Sharma Covert, MD   400 mg at 11/14/18 2110    Lab Results: No results found for this or any previous visit (from the past 80 hour(s)).  Blood Alcohol level:  Lab Results  Component Value Date   ETH <10 10/31/2018   ETH <5 44/11/270    Metabolic Disorder Labs: Lab Results  Component Value Date   HGBA1C 5.4 11/03/2018   MPG 108.28 11/03/2018   No results found for: PROLACTIN Lab Results  Component Value Date   CHOL 150 11/03/2018   TRIG 164 (H) 11/03/2018   HDL 41 11/03/2018   CHOLHDL 3.7 11/03/2018   VLDL 33 11/03/2018   LDLCALC 76 11/03/2018   LDLCALC 84 12/10/2015    Physical Findings: AIMS: Facial and Oral Movements Muscles of Facial Expression: None, normal Lips and Perioral Area: None, normal Jaw: None, normal Tongue: None, normal,Extremity Movements Upper (arms, wrists, hands, fingers): Minimal Lower (legs, knees, ankles, toes): None, normal, Trunk Movements Neck, shoulders, hips: None, normal, Overall Severity Severity of abnormal movements (highest score from questions above): Minimal Incapacitation due to abnormal movements: None, normal Patient's awareness of abnormal movements (rate only patient's report): Aware, no distress, Dental Status Current problems with teeth and/or dentures?: Yes Does patient usually wear dentures?: Yes  CIWA:    COWS:     Musculoskeletal: Strength & Muscle Tone: within normal limits Gait & Station: unsteady Patient leans: N/A  Psychiatric Specialty Exam: Physical Exam  Nursing note and vitals reviewed. Constitutional: She is oriented to person, place, and time. She appears well-developed and well-nourished.  HENT:  Head: Normocephalic and atraumatic.  Respiratory: Effort normal.  Neurological: She is  alert and oriented to person, place, and time.    ROS  Blood pressure 124/81, pulse 71, temperature 98.7 F (37.1 C), resp. rate 16, weight 68 kg, SpO2 100 %.Body mass index is 28.34 kg/m.  General Appearance: Casual  Eye Contact:  Fair  Speech:  Paucity of speech  Volume:  Decreased  Mood:  Anxious and Dysphoric  Affect:  Congruent  Thought Process:  Coherent and Descriptions of Associations: Circumstantial  Orientation:  Full (Time, Place, and Person)  Thought Content:  Negative  Suicidal Thoughts:  No  Homicidal Thoughts:  No  Memory:  Immediate;   Poor Recent;   Poor Remote;   Poor  Judgement:  Impaired  Insight:  Fair  Psychomotor Activity:  Increased  Concentration:  Concentration: Fair and Attention Span: Fair  Recall:  Poor  Fund of Knowledge:  Fair  Language:  Fair  Akathisia:  Negative  Handed:  Right  AIMS (if indicated):     Assets:  Desire for Improvement Housing Resilience  ADL's:  Intact  Cognition:  WNL  Sleep:  Number of Hours: 5.5     Treatment Plan Summary: Daily contact with patient to assess and evaluate symptoms and progress in treatment, Medication management and Plan : Patient is seen and examined.  Patient is a 52 year old female with a past psychiatric history significant for schizoaffective disorder; bipolar type versus bipolar disorder.  She is seen in follow-up patient is essentially unchanged from yesterday.  I suspect there may be some degree of dementia in this patient.  I am going to have social work contact her sister who seems to be the closest relative to her.  I am going to continue her current medications at their  current dosages.  I am going to re-add the Trintellix at 5 mg p.o. daily.  I am going to order a CT scan of her head to make sure about her structural brain status.  I am also going to order a lithium level for the a.m.  If the patient is living alone (which I think she is) I do not think that even after discharge she will be  able to cope with taking her medicines correctly etc.  We will need to contact his sister to get a better idea of this. 1.  Continue lithium carbonate 300 mg p.o. twice daily for mood stability. 2.  Lithium level in a.m. tomorrow. 3.  Continue lorazepam 05 mg p.o. 3 times daily for catatonia as well as the anxiety. 4.  Continue Seroquel 400 mg p.o. nightly for mood stability and sleep. 5.  Continue amlodipine 5 mg p.o. daily for hypertension. 6.  Continue extended release propranolol 80 mg p.o. daily for hypothyroidism and hypertension. 7.  Nuclear thyroid study will have to be done as an outpatient. 8.  Restart Trintellix 5 mg p.o. daily for depression and anxiety. 9.  Discussion with sister over patient's ability to care for herself. 10.  Noncontrasted CT scan of the brain. 11.  Nuclear thyroid scan will have to be done as an outpatient. 12.  Disposition planning-in progress  Sharma Covert, MD 11/15/2018, 9:57 AM

## 2018-11-16 LAB — COMPREHENSIVE METABOLIC PANEL
ALT: 20 U/L (ref 0–44)
AST: 16 U/L (ref 15–41)
Albumin: 4 g/dL (ref 3.5–5.0)
Alkaline Phosphatase: 49 U/L (ref 38–126)
Anion gap: 12 (ref 5–15)
BUN: 14 mg/dL (ref 6–20)
CO2: 23 mmol/L (ref 22–32)
Calcium: 10.4 mg/dL — ABNORMAL HIGH (ref 8.9–10.3)
Chloride: 107 mmol/L (ref 98–111)
Creatinine, Ser: 1.17 mg/dL — ABNORMAL HIGH (ref 0.44–1.00)
GFR calc Af Amer: >60 mL/min (ref >60)
GFR calc non Af Amer: 53 mL/min — ABNORMAL LOW (ref >60)
Glucose, Bld: 89 mg/dL (ref 70–99)
Potassium: 3.7 mmol/L (ref 3.5–5.1)
Sodium: 142 mmol/L (ref 135–145)
Total Bilirubin: 0.7 mg/dL (ref 0.3–1.2)
Total Protein: 6.5 g/dL (ref 6.5–8.1)

## 2018-11-16 LAB — TSH: TSH: 0.085 u[IU]/mL — ABNORMAL LOW (ref 0.350–4.500)

## 2018-11-16 LAB — LITHIUM LEVEL: Lithium Lvl: 0.63 mmol/L (ref 0.60–1.20)

## 2018-11-16 MED ORDER — VORTIOXETINE HBR 5 MG PO TABS
5.0000 mg | ORAL_TABLET | Freq: Every day | ORAL | Status: DC
  Administered 2018-11-17 – 2018-11-18 (×2): 5 mg via ORAL
  Filled 2018-11-16 (×5): qty 1

## 2018-11-16 MED ORDER — BENZTROPINE MESYLATE 0.5 MG PO TABS
0.5000 mg | ORAL_TABLET | Freq: Every day | ORAL | Status: DC
  Administered 2018-11-17: 0.5 mg via ORAL
  Filled 2018-11-16 (×3): qty 1

## 2018-11-16 NOTE — Progress Notes (Signed)
Nursing 1:1 Note  Patient visible in dayroom attending group with 1:1 sitter sitting beside her. Pt alert and appears attentive in group.  Pt remains safe on the unit with 1:1 sitter and Q 15 min checks

## 2018-11-16 NOTE — [facility] Counselor (Signed)
Clinical Social Work Note  Pt's sister Roniesha Hollingshead called CSW to discuss d/c plan.  She has moved in with pt and their father in order to provide closer supervision for both.  She had already started administering pt's medicine a few months ago and will continue to do so.  She does think pt can do things like wash dishes at the home, but does not think that she is capable of remembering to take her meds regularly.  Until such time as circumstances change, sister anticipates being in the home and providing the needed support that dr. refers to in his notes.  Additionally, there are 3 brothers who do come quite frequently to provide back-up at times that sister is out of town or needs some relief.  Sister feels that pt is safe to discharge Monday or Tuesday.  Selmer Dominion, LCSW 11/16/2018, 9:20 AM

## 2018-11-16 NOTE — Progress Notes (Signed)
Adult Psychoeducational Group Note  Date:  11/16/2018 Time: 8:45am- 10:00am  Group Topic/Focus:  Goals Group:   The focus of this group is to help patients establish daily goals to achieve during treatment and discuss how the patient can incorporate goal setting into their daily lives to aide in recovery.  Participation Level:  Active  Participation Quality:  Appropriate and Attentive  Affect:  Appropriate  Cognitive:  Alert, Appropriate and Oriented  Insight: Appropriate and Good  Engagement in Group:  Engaged  Modes of Intervention:  Discussion and Support  Additional Comments:  Patient informed the group that her goal is to be able to go along with the change of command, to set goals, as well as to utilize what's available so that she can begin to work towards moving forward.   Doyle Askew 11/16/2018, 2:11 PM

## 2018-11-16 NOTE — Progress Notes (Signed)
Nursing 1:1 Note  Patient visible in the dayroom- attentive and  engaged in group with 1:1 sitter sitting beside her. Pt remains safe on the unit with 1:1 sitter and Q 15 min checks.

## 2018-11-16 NOTE — Progress Notes (Signed)
1:1 note  Patient observed resting on her back with eyes closed. RR WNL, even and unlabored. Audible snoring. NAD, no complaints. Level I obs in place for safety and patient is safe.

## 2018-11-16 NOTE — Progress Notes (Signed)
1:1 Nursing Note  Pt in the dining room eating - sitting next to 1:1 sitter . Pt voices no complaints. Pt remains safe with 1:1 sitter and Q 15 min checks

## 2018-11-16 NOTE — Plan of Care (Signed)
  Problem: Activity: Goal: Interest or engagement in activities will improve Outcome: Progressing Goal: Sleeping patterns will improve Outcome: Progressing   

## 2018-11-16 NOTE — Progress Notes (Signed)
[facility] MD Progress Note  11/16/2018 11:04 AM Paula Dickson  MRN:  [id number] Subjective:    Patient is seen and examined. Patient is a 52 year old female with a past psychiatric history significant for schizoaffective disorder versus bipolar disorder. She was admitted on 11/02/2018.  Objective: Patient is seen and examined.  Patient's 52 year old female with the above-stated past psychiatric history is seen in follow-up.  She is essentially unchanged from yesterday.  We did do a CT scan of her head, and there was no evidence of any vascular disease or other issues.  It was a noncontrasted CT, but I suspect that she has some underlying dementia either from some other process or perhaps because of her longstanding schizoaffective disorder.  In examination this morning she still word searching to a degree.  She does appear to have some thought blocking at times.  She denied any auditory or visual hallucinations.  She still remains relatively anxious.  Her lithium level this a.m. was 0.63.  Her creatinine did bump slightly to 1.17.  Previously it had been 1.09.  Patient is less tremulous today, but she clearly has some cognitive deficits.  Vital signs are stable, she is afebrile.  She slept 6.25 hours last night.  Principal Problem: Bipolar disorder, most recent episode depressed ([place]) Diagnosis: Principal Problem:   Bipolar disorder, most recent episode depressed ([place]) Active Problems:   Bipolar affective disorder, current episode mixed ([place])  Total Time spent with patient: 20 minutes  Past Psychiatric History: See admission H&P  Past Medical History:  Past Medical History:  Diagnosis Date  . Bipolar affective disorder ([place])   . History of arthritis   . History of chicken pox   . History of depression   . History of genital warts   . history of heart murmur   . History of high blood pressure   . History of thyroid disease   . History of UTI   . Hypertension   . Low TSH level 07/13/2017  .  Schizophrenia [facility])     Past Surgical History:  Procedure Laterality Date  . ABLATION ON ENDOMETRIOSIS    . CYST REMOVAL NECK     around 11 years ago /benign  . MULTIPLE TOOTH EXTRACTIONS     Family History:  Family History  Problem Relation Age of Onset  . Arthritis Father   . Hyperlipidemia Father   . High blood pressure Father   . Diabetes Sister   . Diabetes Mother   . Diabetes Brother   . Mental illness Brother   . Alcohol abuse Paternal Uncle   . Alcohol abuse Paternal Grandfather   . Breast cancer Maternal Aunt   . Breast cancer Paternal Aunt   . High blood pressure Sister   . Mental illness Other        runs in family   Family Psychiatric  History: See admission H&P Social History:  Social History   Substance and Sexual Activity  Alcohol Use No     Social History   Substance and Sexual Activity  Drug Use No    Social History   Socioeconomic History  . Marital status: Single    Spouse name: Not on file  . Number of children: Not on file  . Years of education: Not on file  . Highest education level: Not on file  Occupational History  . Not on file  Social Needs  . Financial resource strain: Not on file  . Food insecurity:    Worry: Not on  file    Inability: Not on file  . Transportation needs:    Medical: Not on file    Non-medical: Not on file  Tobacco Use  . Smoking status: Never Smoker  . Smokeless tobacco: Never Used  Substance and Sexual Activity  . Alcohol use: No  . Drug use: No  . Sexual activity: Not Currently  Lifestyle  . Physical activity:    Days per week: Not on file    Minutes per session: Not on file  . Stress: Not on file  Relationships  . Social connections:    Talks on phone: Not on file    Gets together: Not on file    Attends religious service: Not on file    Active member of club or organization: Not on file    Attends meetings of clubs or organizations: Not on file    Relationship status: Not on file  Other  Topics Concern  . Not on file  Social History Narrative  . Not on file   Additional Social History:                         Sleep: Good  Appetite:  Fair  Current Medications: Current Facility-Administered Medications  Medication Dose Route Frequency Provider Last Rate Last Dose  . acetaminophen (TYLENOL) tablet 650 mg  650 mg Oral Q6H PRN Ethelene Hal, NP      . alum & mag hydroxide-simeth (MAALOX/MYLANTA) 200-200-20 MG/5ML suspension 30 mL  30 mL Oral Q4H PRN Ethelene Hal, NP      . amLODipine (NORVASC) tablet 5 mg  5 mg Oral Daily Ethelene Hal, NP   5 mg at 11/16/18 0848  . antiseptic oral rinse (BIOTENE) solution 15 mL  15 mL Mouth Rinse PRN Rozetta Nunnery, NP      . Derrill Memo ON 11/17/2018] benztropine (COGENTIN) tablet 0.5 mg  0.5 mg Oral Daily Farida Mcreynolds, Cordie Grice, MD      . feeding supplement (ENSURE ENLIVE) (ENSURE ENLIVE) liquid 237 mL  237 mL Oral BID BM Sharma Covert, MD   237 mL at 11/14/18 0916  . hydrOXYzine (ATARAX/VISTARIL) tablet 25 mg  25 mg Oral TID PRN Ethelene Hal, NP   25 mg at 11/10/18 2119  . lithium carbonate capsule 300 mg  300 mg Oral BID WC Sharma Covert, MD   300 mg at 11/16/18 0848  . LORazepam (ATIVAN) tablet 0.5 mg  0.5 mg Oral TID Sharma Covert, MD   0.5 mg at 11/16/18 0848  . magnesium hydroxide (MILK OF MAGNESIA) suspension 30 mL  30 mL Oral Daily PRN Ethelene Hal, NP      . omega-3 acid ethyl esters (LOVAZA) capsule 1 g  1 g Oral BID Johnn Hai, MD   1 g at 11/16/18 0848  . prenatal multivitamin tablet 1 tablet  1 tablet Oral Q1200 Johnn Hai, MD   1 tablet at 11/15/18 1318  . propranolol ER (INDERAL LA) 24 hr capsule 80 mg  80 mg Oral Daily Johnn Hai, MD   80 mg at 11/16/18 0848  . QUEtiapine (SEROQUEL) tablet 400 mg  400 mg Oral QHS Sharma Covert, MD   400 mg at 11/15/18 2252  . vortioxetine HBr (TRINTELLIX) tablet 5 mg  5 mg Oral Daily Eliam Snapp, Cordie Grice, MD        Lab  Results:  Results for orders placed or performed during the hospital encounter of  11/01/18 (from the past 48 hour(s))  Lithium level     Status: None   Collection Time: 11/16/18  6:42 AM  Result Value Ref Range   Lithium Lvl 0.63 0.60 - 1.20 mmol/L    Comment: Performed at [facility], [place] [address], [place], [place] [address]  TSH     Status: Abnormal   Collection Time: 11/16/18  6:42 AM  Result Value Ref Range   TSH 0.085 (L) 0.350 - 4.500 uIU/mL    Comment: Performed by a 3rd Generation assay with a functional sensitivity of <=0.01 uIU/mL. Performed at [facility], [place] [address], [place], [place] [address]   Comprehensive metabolic panel     Status: Abnormal   Collection Time: 11/16/18  6:42 AM  Result Value Ref Range   Sodium 142 135 - 145 mmol/L   Potassium 3.7 3.5 - 5.1 mmol/L   Chloride 107 98 - 111 mmol/L   CO2 23 22 - 32 mmol/L   Glucose, Bld 89 70 - 99 mg/dL   BUN 14 6 - 20 mg/dL   Creatinine, Ser 1.17 (H) 0.44 - 1.00 mg/dL   Calcium 10.4 (H) 8.9 - 10.3 mg/dL   Total Protein 6.5 6.5 - 8.1 g/dL   Albumin 4.0 3.5 - 5.0 g/dL   AST 16 15 - 41 U/L   ALT 20 0 - 44 U/L   Alkaline Phosphatase 49 38 - 126 U/L   Total Bilirubin 0.7 0.3 - 1.2 mg/dL   GFR calc non Af Amer 53 (L) >60 mL/min   GFR calc Af Amer >60 >60 mL/min   Anion gap 12 5 - 15    Comment: Performed at [facility], [place] [address], [place], [place] [address]    Blood Alcohol level:  Lab Results  Component Value Date   [facility] <10 10/31/2018   ETH <5 33/29/5188    Metabolic Disorder Labs: Lab Results  Component Value Date   HGBA1C 5.4 11/03/2018   MPG 108.28 11/03/2018   No results found for: PROLACTIN Lab Results  Component Value Date   CHOL 150 11/03/2018   TRIG 164 (H) 11/03/2018   HDL 41 11/03/2018   CHOLHDL 3.7 11/03/2018   VLDL 33 11/03/2018   LDLCALC 76 11/03/2018   LDLCALC 84 12/10/2015    Physical Findings: AIMS: Facial and  Oral Movements Muscles of Facial Expression: None, normal Lips and Perioral Area: None, normal Jaw: None, normal Tongue: None, normal,Extremity Movements Upper (arms, wrists, hands, fingers): Minimal Lower (legs, knees, ankles, toes): None, normal, Trunk Movements Neck, shoulders, hips: None, normal, Overall Severity Severity of abnormal movements (highest score from questions above): Minimal Incapacitation due to abnormal movements: None, normal Patient's awareness of abnormal movements (rate only patient's report): Aware, no distress, Dental Status Current problems with teeth and/or dentures?: Yes Does patient usually wear dentures?: Yes  CIWA:    COWS:     Musculoskeletal: Strength & Muscle Tone: within normal limits Gait & Station: unsteady Patient leans: N/A  Psychiatric Specialty Exam: Physical Exam  Nursing note and vitals reviewed. Constitutional: She is oriented to person, place, and time. She appears well-developed and well-nourished.  HENT:  Head: Normocephalic and atraumatic.  Respiratory: Effort normal.  Neurological: She is alert and oriented to person, place, and time.    ROS  Blood pressure (!) 123/95, pulse 75, temperature 97.7 F (36.5 C), temperature source Oral, resp. rate 16, weight 68 kg, SpO2 100 %.Body mass index is 28.34 kg/m.  General Appearance: Casual  Eye Contact:  Fair  Speech:  Blocked  Volume:  Decreased  Mood:  Anxious  Affect:  Congruent  Thought Process:  Disorganized and Descriptions of Associations: Circumstantial  Orientation:  Full (Time, Place, and Person)  Thought Content:  Rumination  Suicidal Thoughts:  No  Homicidal Thoughts:  No  Memory:  Immediate;   Fair Recent;   Fair Remote;   Fair  Judgement:  Intact  Insight:  Fair  Psychomotor Activity:  Increased  Concentration:  Concentration: Fair and Attention Span: Fair  Recall:  [employer] of Knowledge:  Fair  Language:  Fair  Akathisia:  Negative  Handed:  Right  AIMS  (if indicated):     Assets:  Desire for Improvement Financial Resources/Insurance Housing Leisure Time Physical Health Resilience Social Support  ADL's:  Impaired  Cognition:  Impaired,  Moderate  Sleep:  Number of Hours: 6.25     Treatment Plan Summary: Daily contact with patient to assess and evaluate symptoms and progress in treatment, Medication management and Plan : Patient is seen and examined.  Patient is a 52 year old female with a past psychiatric history significant for schizoaffective disorder; bipolar type versus bipolar disorder.  She is seen in follow-up patient is essentially unchanged from yesterday.  I think a lot of what is going on right now is really due to some cognitive dysfunction.  Whether or not it is dementia versus cognitive dysfunction secondary to long-term psychotic illness is unclear.  I think right now we just need to continue her medications as is, continue to encourage her to be ambulatory, and probably either have neuropsychological testing or neurology evaluation afterwards.  She will also need to have a nuclear study done on her thyroid after discharge. 1.  Continue lithium carbonate 300 mg p.o. twice daily for mood stability. 2.  Continue to monitor kidney function with restart of lithium. 3.  Continue lorazepam 0.5 mg p.o. 3 times daily as needed for catatonia as well as anxiety. 4.  Continue Seroquel 400 mg p.o. nightly for mood stability, sleep and psychosis. 5.  Continue amlodipine 5 mg p.o. daily for hypertension. 6.  Continue extended release propranolol 80 mg p.o. daily for hyperthyroidism and hypertension. 7.  Continue Trintellix 5 mg p.o. daily for depression and anxiety. 8.  Nuclear thyroid scan will have to be done as an outpatient. 9.  Disposition planning-in progress.  Sharma Covert, MD 11/16/2018, 11:04 AM

## 2018-11-16 NOTE — BHH Group Notes (Signed)
LCSW Group Therapy Note  11/16/2018    10:00-11:00am   Type of Therapy and Topic:  Group Therapy: Shame and Its Impact   Participation Level:  None   Description of Group:   In this group, patients shared and discussed that guilt is the negative feeling we have when we've done something wrong, while shame is the negative feeling we have simply about "being."  In listening to each other share, patients learned that humans are all imperfect and that there is no shame in this.  We discussed how it could positively impact our wellbeing by accepting our faults as part of our being that can be worked on but does not have to shame [place].    Therapeutic Goals: 1. Patients will learn the difference between guilt and shame. 2. Patients will share their current shame feelings and how this has impacted their current lives. 3. Patients will explore possible ways to think differently about those parts of their bodies, feelings, and lives about which they do have shame. 4. Patients will learn that shame is universal, and that keeping our shame a secret actually increases its hold on [place].  Summary of Patient Progress:  The patient shared that she feels shame about not being honest sometimes.  She could not talk further about this.  Therapeutic Modalities:   Cognitive Behavioral Therapy Motivation Interviewing  Maretta Los  .

## 2018-11-16 NOTE — Plan of Care (Signed)
  Problem: Education: Goal: Emotional status will improve Outcome: Progressing   Problem: Coping: Goal: Coping ability will improve Outcome: Progressing

## 2018-11-16 NOTE — Progress Notes (Signed)
1:1 note -  Patient observed resting in bed with eyes closed. RR WNL, unlabored and even. NAD, no complaints voiced at this time. Level I obs remains in place for patient's safety, and patient remains safe.

## 2018-11-16 NOTE — Progress Notes (Signed)
Adult Psychoeducational Group Note  Date:  11/16/2018 Time:  9:00 PM  Group Topic/Focus:  Wrap-Up Group:   The focus of this group is to help patients review their daily goal of treatment and discuss progress on daily workbooks.  Participation Level:  Active  Participation Quality:  Appropriate  Affect:  Appropriate  Cognitive:  Appropriate  Insight: Appropriate  Engagement in Group:  Engaged  Modes of Intervention:  Discussion  Additional Comments:  Patient attended group and said that her day was a 7.  Her goal was to be more interactive. Patient said she did not accomplished that goal therefore she going to try again tomorrow.   Whittley Carandang W Charlene Cowdrey [id number]/32/0037, 9:00 PM

## 2018-11-16 NOTE — Progress Notes (Signed)
Close observation - level II note:  D: Patient observed sitting in dayroom at start of shift. Presently resting in bed speaking with assigned MHT. Patient states "I'm glad to be here. To be safe." Patient's affect anxious with congruent mood. Patient hesitant, cautious. Some thought blocking exhibited though patient continues to make some improvement daily. Denies pain, physical complaints.   A: Medicated per orders, no prns needed or requested. Medication education provided. Level II/close obs in place for safety. Emotional support offered.  Encouraged to attend and participate in unit programming.  Fall prevention plan in place and reviewed with patient as pt is a high fall risk.   R: Patient verbalizes understanding of POC, falls prevention education however continues to display mild to moderate confusion. Patient denies SI/HI/AVH and remains safe on level II obs. Will continue to monitor throughout the night.

## 2018-11-16 NOTE — Progress Notes (Signed)
D. Pt presents with an anxious affect and mood- observed attending groups in the dayroom. Pt continues to be cautious during ambulation, but appears to have a steady gait. Pt presents as indecisive at times, unable to complete sentences, but has been making attempts to express herself during interactions. Per pt's self inventory, pt rates her depression, hopelessness and anxiety a 8/8/7, respectively. Pt writes that her most important goal today is "to work on doing the right things". Pt currently denies SI/HI and AV hallucinations. A. Labs and vitals monitored. Pt compliant with medications. Pt supported emotionally and encouraged to express concerns and ask questions.   R. Pt remains safe with 15 minute checks. Will continue POC.

## 2018-11-17 MED ORDER — AMLODIPINE BESYLATE 5 MG PO TABS
5.0000 mg | ORAL_TABLET | Freq: Once | ORAL | Status: AC
  Administered 2018-11-17: 5 mg via ORAL
  Filled 2018-11-17: qty 1

## 2018-11-17 MED ORDER — LITHIUM CARBONATE ER 450 MG PO TBCR: 450.0000 mg | EXTENDED_RELEASE_TABLET | Freq: Two times a day (BID) | ORAL | Status: DC

## 2018-11-17 MED ORDER — LITHIUM CARBONATE 300 MG PO CAPS
300.0000 mg | ORAL_CAPSULE | Freq: Two times a day (BID) | ORAL | Status: DC
  Administered 2018-11-17 – 2018-11-18 (×2): 300 mg via ORAL
  Filled 2018-11-17 (×6): qty 1

## 2018-11-17 MED ORDER — AMLODIPINE BESYLATE 10 MG PO TABS
10.0000 mg | ORAL_TABLET | Freq: Every day | ORAL | Status: DC
  Administered 2018-11-18: 10 mg via ORAL
  Filled 2018-11-17 (×3): qty 1

## 2018-11-17 MED ORDER — OXCARBAZEPINE 150 MG PO TABS
75.0000 mg | ORAL_TABLET | Freq: Two times a day (BID) | ORAL | Status: DC
  Administered 2018-11-17 – 2018-11-18 (×2): 75 mg via ORAL
  Filled 2018-11-17 (×6): qty 0.5

## 2018-11-17 NOTE — Progress Notes (Signed)
Level II/Close Observation note -     Patient observed resting on back with eyes closed. RR WNL, even and unlabored. No complaints voiced, distress noted. Level II obs in place for safety and patient remains safe.

## 2018-11-17 NOTE — Progress Notes (Signed)
Patient ID: Paula Dickson, female   DOB: June 27, 1966, 52 y.o.   MRN: [id number] Per State regulations 482.30 this chart was reviewed for medical necessity with respect to the patient's admission/duration of stay.    Next review date: 11/21/18  Debarah Crape, BSN, RN-BC  Case Manager

## 2018-11-17 NOTE — Progress Notes (Signed)
Adult Psychoeducational Group Note  Date:  11/17/2018 Time:  8:40 PM  Group Topic/Focus:  Wrap-up  Participation Level:  Active  Participation Quality:  Appropriate  Affect:  Appropriate  Cognitive:  Alert and Oriented  Insight: Appropriate and Improving  Engagement in Group:  Developing/Improving  Modes of Intervention:  Clarification and Support . Additional Comments:  Pt rated her day a 7. Pt verbalized that something positive is that she was able to go to the gym. Pt verbalized that she was able to think about things that she needs to work on. Pt verbalized that she was also able to get her clothes washed.  Calvyn Kurtzman, Patrick North 11/17/2018, 8:40 PM

## 2018-11-17 NOTE — Progress Notes (Signed)
Nursing Close Observation Note:  Patient observed in the dayroom attending nurse-led group. Pt appears engaged and attentive during group. Pt remains safe on the unit with Level II obs in place and Q 15 min checks.

## 2018-11-17 NOTE — Progress Notes (Signed)
Nursing Close Observation Note:  Patient resting in bed with eyes closed- appears to be sleeping- respirations even and unlabored. Nurse in room sitting at bedside. Pt remains safe on the unit with Level II obs in place and Q 15 min checks

## 2018-11-17 NOTE — Progress Notes (Signed)
Patient observed working on puzzle in day room with staff. Patient in good mood. No distress. Patient remains safe with close obs and q24min checks.

## 2018-11-17 NOTE — Progress Notes (Signed)
D. Pt presents with a flat affect/ anxious mood, and is friendly upon approach- continues to struggle with finding words, but communication appears to improve as the day progresses. It has been reported by staff that pt has been "opening up" more, but pt has trouble at times recalling some info., e.g. pt can not remember if her father has passed away or not.  Per pt's self inventory, pt rates her depression, hopelessness and anxiety all 5's. Pt writes that her most important goal today is "to know I need help, respect others, work on me, and the problem I have inside, not on others" and writes that she will "participate in group. Respect others" to help her meet that goal.  Pt currently denies SI/HI and AV hallucinations. A. Labs and vitals monitored. Pt compliant with medications. Pt supported emotionally and encouraged to express concerns and ask questions.   R. Pt remains safe with 15 minute checks. Will continue POC.

## 2018-11-17 NOTE — BHH Group Notes (Signed)
[place] LCSW Group Therapy Note  Date/Time:  11/17/2018  10:00-11:00AM  Type of Therapy and Topic:  Group Therapy:  Healthy and Unhealthy Supports  Participation Level:  None   Description of Group:  Patients in this group were introduced to the idea of adding a variety of healthy supports to address the various needs in their lives.Patients discussed what additional healthy supports could be helpful in their recovery and wellness after discharge in order to prevent future hospitalizations.   An emphasis was placed on using counselor, doctor, therapy groups, 12-step groups, and problem-specific support groups to expand supports.  They also worked as a group on developing a specific plan for several patients to deal with unhealthy supports through [place], psychoeducation with loved ones, and even termination of relationships.   Therapeutic Goals:   1)  discuss importance of adding supports to stay well once out of the hospital  2)  compare healthy versus unhealthy supports and identify some examples of each  3)  generate ideas and descriptions of healthy supports that can be added  4)  offer mutual support about how to address unhealthy supports  5)  encourage active participation in and adherence to discharge plan    Summary of Patient Progress:  The patient was somewhat late to group and did not talk at all but was attentive.  Therapeutic Modalities:   Motivational Interviewing Brief Solution-Focused Therapy  Selmer Dominion, LCSW

## 2018-11-17 NOTE — Progress Notes (Addendum)
Nursing Close Observation Note:  Patient observed in the dining room with nurse. No distress noted. Pt remains safe on the unit with Level II obs in place and Q 15 min checks

## 2018-11-17 NOTE — Progress Notes (Signed)
Level II/Close observation note -  Patient observed resting on left side with eyes closed. RR WNL, even and unlabored. NAD, no complaints voiced. Level II obs remains in place for safety and patient is safe.

## 2018-11-17 NOTE — Plan of Care (Signed)
D: Patient is alert and cooperative. Patient denies SI, HI, AVH, and verbally contracts for safety. Patient reports feeling more comfortable in the milieu and getting up out of bed. Patient denies physical symptoms/pain.    A: Medications administered per MD order. Support provided. Patient educated on safety on the unit and medications. Patient needs reinforcement with all teaching. Routine safety checks every 15 minutes. Patient stated understanding to tell nurse about any new physical symptoms. Patient understands to tell staff of any needs.     R: No adverse drug reactions noted. Patient verbally contracts for safety. Patient remains safe at this time and will continue to monitor.   Problem: Education: Goal: Knowledge of [place] Education information/materials will improve Outcome: Progressing Goal: Emotional status will improve Outcome: Progressing Goal: Mental status will improve Outcome: Progressing   Problem: Safety: Goal: Periods of time without injury will increase Outcome: Progressing   Patient oriented to the unit. Patient denies SI, HI, AVH, and contracts for safety. Patient reports feeling more comfortable in the milieu and getting up out of bed. Patient remains safe and will continue to monitor.

## 2018-11-17 NOTE — BHH Group Notes (Signed)
[facility] Group Notes:  Nursing Psychoeducation  Date:  11/17/2018  Time:  1:30 PM  Type of Therapy:  Psychoeducational Skills   Group Topic: Life Skills Facilitated By: Dorena Bodo RN  Participation Level:  Active  Participation Quality:  Appropriate  Affect:  Appropriate  Cognitive:  Alert and Oriented  Insight:  Improving  Engagement in Group:  Developing/Improving  Modes of Intervention:  Discussion, Education and Socialization  Summary of Progress/Problems: Patient attended group and contributed to the discussion.  Estill Bamberg A Demiyah Fischbach 11/17/2018, 2:00 PM

## 2018-11-17 NOTE — Progress Notes (Signed)
Patient observed working on a puzzle with peer before dinner- pt's mood and  communication has noticeably improved over the course of the shift.

## 2018-11-17 NOTE — Progress Notes (Signed)
[facility] MD Progress Note  11/17/2018 9:37 AM Paula Dickson  MRN:  [id number] Subjective:  Patient is seen and examined. Patient is a 52 year old female with a past psychiatric history significant for schizoaffective disorder versus bipolar disorder. She was admitted on 11/02/2018.  Objective: Patient is seen and examined.  Patient is a 52 year old female with the above-stated past psychiatric history who is seen in follow-up.  Patient is centrally unchanged from yesterday.  She has remained in bed longer today than yesterday.  Nursing staff stated that after she had received her Ativan this morning she went back to bed.  When I spoke with her she seemed to be more tremulous this morning.  Her anxiety was provoked, and her tremulousness got worse when we discussed getting out of bed and being more active.  Her blood pressure was a little elevated this morning at 123/95, pulse was 75, temperature was 97.7.  Nursing notes reflect that she slept 6.75 hours last night.  She still is having word searching, and some thought blocking.  Principal Problem: Bipolar disorder, most recent episode depressed ([place]) Diagnosis: Principal Problem:   Bipolar disorder, most recent episode depressed ([place]) Active Problems:   Bipolar affective disorder, current episode mixed ([place])  Total Time spent with patient: 20 minutes  Past Psychiatric History: See admission H&P  Past Medical History:  Past Medical History:  Diagnosis Date  . Bipolar affective disorder ([place])   . History of arthritis   . History of chicken pox   . History of depression   . History of genital warts   . history of heart murmur   . History of high blood pressure   . History of thyroid disease   . History of UTI   . Hypertension   . Low TSH level 07/13/2017  . Schizophrenia [facility])     Past Surgical History:  Procedure Laterality Date  . ABLATION ON ENDOMETRIOSIS    . CYST REMOVAL NECK     around 11 years ago /benign  . MULTIPLE TOOTH  EXTRACTIONS     Family History:  Family History  Problem Relation Age of Onset  . Arthritis Father   . Hyperlipidemia Father   . High blood pressure Father   . Diabetes Sister   . Diabetes Mother   . Diabetes Brother   . Mental illness Brother   . Alcohol abuse Paternal Uncle   . Alcohol abuse Paternal Grandfather   . Breast cancer Maternal Aunt   . Breast cancer Paternal Aunt   . High blood pressure Sister   . Mental illness Other        runs in family   Family Psychiatric  History: See admission H&P Social History:  Social History   Substance and Sexual Activity  Alcohol Use No     Social History   Substance and Sexual Activity  Drug Use No    Social History   Socioeconomic History  . Marital status: Single    Spouse name: Not on file  . Number of children: Not on file  . Years of education: Not on file  . Highest education level: Not on file  Occupational History  . Not on file  Social Needs  . Financial resource strain: Not on file  . Food insecurity:    Worry: Not on file    Inability: Not on file  . Transportation needs:    Medical: Not on file    Non-medical: Not on file  Tobacco Use  . Smoking status:  Never Smoker  . Smokeless tobacco: Never Used  Substance and Sexual Activity  . Alcohol use: No  . Drug use: No  . Sexual activity: Not Currently  Lifestyle  . Physical activity:    Days per week: Not on file    Minutes per session: Not on file  . Stress: Not on file  Relationships  . Social connections:    Talks on phone: Not on file    Gets together: Not on file    Attends religious service: Not on file    Active member of club or organization: Not on file    Attends meetings of clubs or organizations: Not on file    Relationship status: Not on file  Other Topics Concern  . Not on file  Social History Narrative  . Not on file   Additional Social History:                         Sleep: Good  Appetite:  Good  Current  Medications: Current Facility-Administered Medications  Medication Dose Route Frequency Provider Last Rate Last Dose  . acetaminophen (TYLENOL) tablet 650 mg  650 mg Oral Q6H PRN Ethelene Hal, NP      . alum & mag hydroxide-simeth (MAALOX/MYLANTA) 200-200-20 MG/5ML suspension 30 mL  30 mL Oral Q4H PRN Ethelene Hal, NP      . amLODipine (NORVASC) tablet 5 mg  5 mg Oral Daily Ethelene Hal, NP   5 mg at 11/17/18 0802  . antiseptic oral rinse (BIOTENE) solution 15 mL  15 mL Mouth Rinse PRN Lindon Romp A, NP      . benztropine (COGENTIN) tablet 0.5 mg  0.5 mg Oral Daily Paula Covert, MD   0.5 mg at 11/17/18 0802  . feeding supplement (ENSURE ENLIVE) (ENSURE ENLIVE) liquid 237 mL  237 mL Oral BID BM Paula Covert, MD   237 mL at 11/16/18 1455  . hydrOXYzine (ATARAX/VISTARIL) tablet 25 mg  25 mg Oral TID PRN Ethelene Hal, NP   25 mg at 11/10/18 2119  . lithium carbonate capsule 300 mg  300 mg Oral BID WC Paula Covert, MD   300 mg at 11/17/18 0802  . LORazepam (ATIVAN) tablet 0.5 mg  0.5 mg Oral TID Paula Covert, MD   0.5 mg at 11/17/18 0803  . magnesium hydroxide (MILK OF MAGNESIA) suspension 30 mL  30 mL Oral Daily PRN Ethelene Hal, NP      . omega-3 acid ethyl esters (LOVAZA) capsule 1 g  1 g Oral BID Johnn Hai, MD   1 g at 11/17/18 0802  . prenatal multivitamin tablet 1 tablet  1 tablet Oral Q1200 Johnn Hai, MD   1 tablet at 11/16/18 1259  . propranolol ER (INDERAL LA) 24 hr capsule 80 mg  80 mg Oral Daily Johnn Hai, MD   80 mg at 11/17/18 0802  . QUEtiapine (SEROQUEL) tablet 400 mg  400 mg Oral QHS Paula Covert, MD   400 mg at 11/16/18 2156  . vortioxetine HBr (TRINTELLIX) tablet 5 mg  5 mg Oral Daily Paula Covert, MD   5 mg at 11/17/18 6314    Lab Results:  Results for orders placed or performed during the hospital encounter of 11/01/18 (from the past 48 hour(s))  Lithium level     Status: None   Collection  Time: 11/16/18  6:42 AM  Result Value Ref Range  Lithium Lvl 0.63 0.60 - 1.20 mmol/L    Comment: Performed at [facility], [place] [address], [place], [place] [address]  TSH     Status: Abnormal   Collection Time: 11/16/18  6:42 AM  Result Value Ref Range   TSH 0.085 (L) 0.350 - 4.500 uIU/mL    Comment: Performed by a 3rd Generation assay with a functional sensitivity of <=0.01 uIU/mL. Performed at [facility], [place] [address], [place], [place] [address]   Comprehensive metabolic panel     Status: Abnormal   Collection Time: 11/16/18  6:42 AM  Result Value Ref Range   Sodium 142 135 - 145 mmol/L   Potassium 3.7 3.5 - 5.1 mmol/L   Chloride 107 98 - 111 mmol/L   CO2 23 22 - 32 mmol/L   Glucose, Bld 89 70 - 99 mg/dL   BUN 14 6 - 20 mg/dL   Creatinine, Ser 1.17 (H) 0.44 - 1.00 mg/dL   Calcium 10.4 (H) 8.9 - 10.3 mg/dL   Total Protein 6.5 6.5 - 8.1 g/dL   Albumin 4.0 3.5 - 5.0 g/dL   AST 16 15 - 41 U/L   ALT 20 0 - 44 U/L   Alkaline Phosphatase 49 38 - 126 U/L   Total Bilirubin 0.7 0.3 - 1.2 mg/dL   GFR calc non Af Amer 53 (L) >60 mL/min   GFR calc Af Amer >60 >60 mL/min   Anion gap 12 5 - 15    Comment: Performed at [facility], [place] [address], [place], [place] [address]    Blood Alcohol level:  Lab Results  Component Value Date   [facility] <10 10/31/2018   ETH <5 76/28/3151    Metabolic Disorder Labs: Lab Results  Component Value Date   HGBA1C 5.4 11/03/2018   MPG 108.28 11/03/2018   No results found for: PROLACTIN Lab Results  Component Value Date   CHOL 150 11/03/2018   TRIG 164 (H) 11/03/2018   HDL 41 11/03/2018   CHOLHDL 3.7 11/03/2018   VLDL 33 11/03/2018   LDLCALC 76 11/03/2018   LDLCALC 84 12/10/2015    Physical Findings: AIMS: Facial and Oral Movements Muscles of Facial Expression: None, normal Lips and Perioral Area: None, normal Jaw: None, normal Tongue: None, normal,Extremity Movements Upper  (arms, wrists, hands, fingers): Minimal Lower (legs, knees, ankles, toes): None, normal, Trunk Movements Neck, shoulders, hips: None, normal, Overall Severity Severity of abnormal movements (highest score from questions above): Minimal Incapacitation due to abnormal movements: None, normal Patient's awareness of abnormal movements (rate only patient's report): Aware, no distress, Dental Status Current problems with teeth and/or dentures?: Yes Does patient usually wear dentures?: Yes  CIWA:    COWS:     Musculoskeletal: Strength & Muscle Tone: within normal limits Gait & Station: unsteady Patient leans: N/A  Psychiatric Specialty Exam: Physical Exam  Nursing note and vitals reviewed. Constitutional: She is oriented to person, place, and time. She appears well-developed and well-nourished.  HENT:  Head: Normocephalic and atraumatic.  Respiratory: Effort normal.  Neurological: She is alert and oriented to person, place, and time.    ROS  Blood pressure (!) 123/95, pulse 75, temperature 97.7 F (36.5 C), temperature source Oral, resp. rate 16, weight 68 kg, SpO2 100 %.Body mass index is 28.34 kg/m.  General Appearance: Casual  Eye Contact:  Fair  Speech:  Blocked  Volume:  Decreased  Mood:  Anxious and Depressed  Affect:  Congruent  Thought Process:  Coherent and Descriptions of Associations: Circumstantial  Orientation:  Full (Time, Place, and Person)  Thought Content:  Rumination  Suicidal Thoughts:  No  Homicidal Thoughts:  No  Memory:  Immediate;   Fair Recent;   Fair Remote;   Fair  Judgement:  Intact  Insight:  Fair  Psychomotor Activity:  Increased  Concentration:  Concentration: Poor and Attention Span: Poor  Recall:  [employer] of Knowledge:  Fair  Language:  Fair  Akathisia:  Negative  Handed:  Right  AIMS (if indicated):     Assets:  Desire for Improvement Financial Resources/Insurance Housing Physical Health Resilience Social Support  ADL's:  Intact   Cognition:  WNL  Sleep:  Number of Hours: 6.75     Treatment Plan Summary: Daily contact with patient to assess and evaluate symptoms and progress in treatment, Medication management and Plan : Patient is seen and examined.  Patient is a 52 year old female with a past psychiatric history significant for schizoaffective disorder; bipolar type versus bipolar disorder.  She is seen in follow-up patient is essentially unchanged from yesterday.  I think a lot of what is going on right now is really due to some cognitive dysfunction.  Whether or not it is dementia versus cognitive dysfunction secondary to long-term psychotic illness is unclear. She is very sensitive to any questioning, and becomes a bit more anxious each time.  I think that substantiates some of her cognitive dysfunction.  I tried to review some of her records to look and see what medications may have been effective in the past for her I also reviewed some of the notes about her difficulty at home.  Everything is essentially the same as she was when she was discharged from [facility], except for the Depakote as well as the addition of the Trintellix.  Also her lithium dosages were higher at 450 of the CR form of lithium.  Given her age I am a little nervous doing that, so we will continue the 300 of the short acting.  Rather than going with Depakote on a try a little bit of Trileptal.  We will start with 75 mg p.o. twice daily and titrate that for mood stability as well as anxiety.  I am also going to increase her amlodipine to 10 mg p.o. daily.  If it leads to significant bradycardia we may have to get rid or reduce the extended release propranolol. 1.  Continue lithium carbonate 300 mg p.o. twice daily for mood stability. 2.  Continue to monitor kidney function with restart of lithium. 3.  Continue lorazepam 0.5 mg p.o. 3 times daily for catatonia as well as anxiety. 4.  Continue Seroquel 400 mg p.o. nightly for mood stability, sleep and  psychosis. 5.  Increase amlodipine to 10 mg p.o. daily for hypertension. 6.  Continue extended release propranolol 80 mg p.o. daily for hyperthyroidism as well as hypertension.  This may need to be reduced if she becomes bradycardic. 7.  Continue Trintellix 5 mg p.o. daily for depression and anxiety. 8.  Start Trileptal 75 mg p.o. twice daily for anxiety and mood stability. 9.  Disposition planning-in progress.   Paula Covert, MD 11/17/2018, 9:37 AM

## 2018-11-18 ENCOUNTER — Ambulatory Visit (HOSPITAL_COMMUNITY): Payer: Self-pay

## 2018-11-18 MED ORDER — LORAZEPAM 0.5 MG PO TABS: 0.5000 mg | ORAL_TABLET | Freq: Three times a day (TID) | ORAL | 0 refills | Status: DC

## 2018-11-18 MED ORDER — PROPRANOLOL HCL ER 80 MG PO CP24: 80.0000 mg | ORAL_CAPSULE | Freq: Every day | ORAL | 0 refills | Status: DC

## 2018-11-18 MED ORDER — OXCARBAZEPINE 150 MG PO TABS: 75.0000 mg | ORAL_TABLET | Freq: Two times a day (BID) | ORAL | 0 refills | Status: DC

## 2018-11-18 MED ORDER — LITHIUM CARBONATE 300 MG PO CAPS: 300.0000 mg | ORAL_CAPSULE | Freq: Two times a day (BID) | ORAL | 0 refills | Status: DC

## 2018-11-18 MED ORDER — VORTIOXETINE HBR 5 MG PO TABS: 5.0000 mg | ORAL_TABLET | Freq: Every day | ORAL | 0 refills | Status: DC

## 2018-11-18 MED ORDER — HYDROXYZINE HCL 25 MG PO TABS: 25.0000 mg | ORAL_TABLET | Freq: Three times a day (TID) | ORAL | 0 refills | Status: DC | PRN

## 2018-11-18 MED ORDER — PRENATAL MULTIVITAMIN CH: 1.0000 | ORAL_TABLET | Freq: Every day | ORAL | Status: DC

## 2018-11-18 MED ORDER — QUETIAPINE FUMARATE 400 MG PO TABS: 400.0000 mg | ORAL_TABLET | Freq: Every day | ORAL | 0 refills | Status: DC

## 2018-11-18 MED ORDER — BIOTENE DRY MOUTH MT LIQD: 15.0000 mL | OROMUCOSAL | Status: DC | PRN

## 2018-11-18 MED ORDER — OMEGA-3-ACID ETHYL ESTERS 1 G PO CAPS: 1.0000 g | ORAL_CAPSULE | Freq: Two times a day (BID) | ORAL | Status: DC

## 2018-11-18 MED ORDER — AMLODIPINE BESYLATE 10 MG PO TABS: 10.0000 mg | ORAL_TABLET | Freq: Every day | ORAL | Status: DC

## 2018-11-18 NOTE — BHH Suicide Risk Assessment (Signed)
[facility] Discharge Suicide Risk Assessment   Principal Problem: Bipolar disorder, most recent episode depressed ([place]) Discharge Diagnoses: Principal Problem:   Bipolar disorder, most recent episode depressed ([place]) Active Problems:   Bipolar affective disorder, current episode mixed ([place])   Total Time spent with patient: 20 minutes  Musculoskeletal: Strength & Muscle Tone: within normal limits Gait & Station: normal Patient leans: N/A  Psychiatric Specialty Exam: Review of Systems  All other systems reviewed and are negative.   Blood pressure 118/87, pulse 73, temperature 97.9 F (36.6 C), temperature source Oral, resp. rate 14, weight 68 kg, SpO2 100 %.Body mass index is 28.34 kg/m.  General Appearance: Casual  Eye Contact::  Good  Speech:  Normal Rate409  Volume:  Normal  Mood:  Euthymic  Affect:  Congruent  Thought Process:  Coherent and Descriptions of Associations: Intact  Orientation:  Full (Time, Place, and Person)  Thought Content:  Logical  Suicidal Thoughts:  No  Homicidal Thoughts:  No  Memory:  Immediate;   Fair Recent;   Fair Remote;   Fair  Judgement:  Intact  Insight:  Fair  Psychomotor Activity:  Normal  Concentration:  Fair  Recall:  [employer] of Knowledge:Fair  Language: Good  Akathisia:  Negative  Handed:  Right  AIMS (if indicated):     Assets:  Communication Skills Desire for Improvement Financial Resources/Insurance Housing Leisure Time Physical Health Resilience Social Support  Sleep:  Number of Hours: 6.75  Cognition: WNL  ADL's:  Intact   Mental Status Per Nursing Assessment::   On Admission:     Demographic Factors:  Low socioeconomic status and Unemployed  Loss Factors: Decline in physical health  Historical Factors: Impulsivity  Risk Reduction Factors:   Sense of responsibility to family, Living with another person, especially a relative and Positive social support  Continued Clinical Symptoms:  Schizophrenia:    Depressive state  Cognitive Features That Contribute To Risk:  None    Suicide Risk:  Minimal: No identifiable suicidal ideation.  Patients presenting with no risk factors but with morbid ruminations; may be classified as minimal risk based on the severity of the depressive symptoms  Follow-up Information    [name]. Go on 11/25/2018.   Specialty:  Behavioral Health Why:  Your next hospital follow up appointment is Monday, 11/25/18 at 10:00a. Please bring: photo ID, proof of insurance, and discharge paperwork from this hospitalization. Contact information: [place] [address] [phone]           Plan Of Care/Follow-up recommendations:  Activity:  ad lib  Sharma Covert, MD 11/18/2018, 9:02 AM

## 2018-11-18 NOTE — Progress Notes (Addendum)
Recreation Therapy Notes  Date: 12.23.19 Time: 0930 Location: 300 Hall Dayroom  Group Topic: Stress Management  Goal Area(s) Addresses:  Patient will verbalize importance of using healthy stress management.  Patient will identify positive emotions associated with healthy stress management.   Behavioral Response: Engaged  Intervention: Stress Management  Activity :  Meditation.  LRT introduced the stress management technique of meditation.  LRT played a meditation that allowed patients to focus on the possibilities the day holds.  Patients were to follow along as the meditation played to engage in the activity.  Education:  Stress Management, Discharge Planning.   Education Outcome: Acknowledges edcuation/In group clarification offered/Needs additional education  Clinical Observations/Feedback: Pt attended and was engaged in group.     [name], LRT/CTRS         Ria Comment, Anisa Leanos A 11/18/2018 10:56 AM

## 2018-11-18 NOTE — Progress Notes (Addendum)
1:1 Note 1030  Patient has been sitting in dayroom with peers and staff.  Patient stated she is feeling better today.  Patient has talked to MD this morning.  Patient has been smiling and talking more this morning.  Respirations even and unlabored.   No signs/symptoms of pain/distress noted on patient's face/body movements.  D:  Patient's self inventory sheet, patient sleeps good, sleep medication helpful.  Good appetite, normal energy level, poor concentration.  Rated depression, hopeless and anxiety #7.  Withdrawals, cravings, tremors, runny nose.  Denied SI.  Physical problems.  Physical pain, worst pain #7 in past 24 hours.  Pain medicine helpful.  Goal is participate in groups.  Plans to talk about myself.  Plans to talk to SW.  Does have discharge plans. A:  Medications administered per MD orders.  Emotional support and encouragement given patient. R:  Denied SI and HI, contracts for safety.  Denied A/V hallucinations.  Safety maintained with 15 minute checks.

## 2018-11-18 NOTE — Progress Notes (Signed)
Close Observation Note 0740  Patient standing in hallway (400), talking to staff.  Patient stated she slept well and ate 100% of her breakfast.  Stated she is feeling better.  Patient wants to talk to SW as soon as possible about discharge and other things.  Patient denied SI and HI, contracts for safety.  Denied A/V hallucinations.  Patient walking this morning, not using wheelchair.  Respirations even and unlabored.  No signs/symptoms of pain/distress on patient's face/body movements.  Safety maintained with close observation.

## 2018-11-18 NOTE — Progress Notes (Signed)
Patient observed sleeping in bed. Patient respiration even and unlabored. No distress noted. Patient remains safe with close obs and q68min checks.

## 2018-11-18 NOTE — Progress Notes (Signed)
1:1 discontinued note 1500 Patient has been sitting in dayroom reading and watching tv with peers and staff.  Respirations even and unlabored.  No signs/symptoms of pain/distress noted on patient's face/body movements.  Safety maintained with 15 minute checks.

## 2018-11-18 NOTE — Progress Notes (Addendum)
Patient observed sleeping in bed. Patient respiration even and unlabored. No distress noted. Patient remains safe with close obs and q34min checks.

## 2018-11-18 NOTE — Progress Notes (Signed)
Discharge Note:  Patient discharged home with sister.  Patient denied SI and HI.  Denied A/V hallucinations.  Suicide prevention information given and discussed with patient who stated she understood and had no questions. My[employer] App information also given to patient for suicide prevention.  Patient stated she received all her belongings, clothing, toiletries, misc items, prescriptions, med, etc.  Patient stated she appreciated all assistance received from [facility] staff.  All required discharge information given to patient at discharge.

## 2018-11-18 NOTE — Discharge Summary (Signed)
Physician Discharge Summary Note  Patient:  Paula Dickson is an 52 y.o., female  MRN:  [id number]  DOB:  12-04-1965  Patient phone:  [phone] (home)   address:   [place] [address],   Total Time spent with patient: Greater than 30 minutes  Date of Admission:  11/01/2018  Date of Discharge: 11/18/2018  Reason for Admission: Worsening symptoms of Bipolar disorder.  Principal Problem: Bipolar disorder, most recent episode depressed [facility])  Discharge Diagnoses: Patient Active Problem List   Diagnosis Date Noted  . Bipolar disorder, most recent episode depressed ([place]) [F31.30] 07/12/2017    Priority: High  . Bipolar affective disorder, current episode mixed ([place]) [F31.60] 11/01/2018  . Toxic encephalopathy [G92] 07/14/2017  . Suicide attempt ([place]) [T14.91XA] 07/13/2017  . Low TSH level [R79.89] 07/13/2017  . Overdose of psychotropic [T43.91XA] 07/12/2017  . Valproic acid toxicity [T42.6X1A] 07/12/2017  . Hyperammonemia ([place]) [E72.20] 07/12/2017  . Schizophrenia ([place]) [F20.9] 07/12/2017  . Insomnia [G47.00] 12/27/2015  . Abnormal urinalysis [R82.90] 12/27/2015  . 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 Bipolar I disorder, most recent episode manic, severe with psychotic features ([place]) [F31.2] 06/14/2015   Past Psychiatric History: Bipolar 1 disorder.  Past Medical History:  Past Medical History:  Diagnosis Date  . Bipolar affective disorder ([place])   . History of arthritis   . History of chicken pox   . History of depression   . History of genital warts   . history of heart murmur   . History of high blood pressure   . History of thyroid disease   . History of UTI   . Hypertension   . Low TSH level 07/13/2017  . Schizophrenia [facility])     Past Surgical History:  Procedure Laterality Date  . ABLATION ON ENDOMETRIOSIS    . CYST REMOVAL NECK     around 11 years ago /benign  . MULTIPLE TOOTH EXTRACTIONS     Family History:  Family History   Problem Relation Age of Onset  . Arthritis Father   . Hyperlipidemia Father   . High blood pressure Father   . Diabetes Sister   . Diabetes Mother   . Diabetes Brother   . Mental illness Brother   . Alcohol abuse Paternal Uncle   . Alcohol abuse Paternal Grandfather   . Breast cancer Maternal Aunt   . Breast cancer Paternal Aunt   . High blood pressure Sister   . Mental illness Other        runs in family   Family Psychiatric  History: Brother with bipolar disorder.  Social History:  Social History   Substance and Sexual Activity  Alcohol Use No     Social History   Substance and Sexual Activity  Drug Use No    Social History   Socioeconomic History  . Marital status: Single    Spouse name: Not on file  . Number of children: Not on file  . Years of education: Not on file  . Highest education level: Not on file  Occupational History  . Not on file  Social Needs  . Financial resource strain: Not on file  . Food insecurity:    Worry: Not on file    Inability: Not on file  . Transportation needs:    Medical: Not on file    Non-medical: Not on file  Tobacco Use  . Smoking status: Never Smoker  . Smokeless tobacco: Never Used  Substance and Sexual Activity  . Alcohol  use: No  . Drug use: No  . Sexual activity: Not Currently  Lifestyle  . Physical activity:    Days per week: Not on file    Minutes per session: Not on file  . Stress: Not on file  Relationships  . Social connections:    Talks on phone: Not on file    Gets together: Not on file    Attends religious service: Not on file    Active member of club or organization: Not on file    Attends meetings of clubs or organizations: Not on file    Relationship status: Not on file  Other Topics Concern  . Not on file  Social History Narrative  . Not on file   Hospital Course: (Per Md's admission evalaution): 52 year old single female, no children, lives with sister. Patient presents as poor historian  at this time, has difficulty answering questions or elaborating on pertinent information. Some thought blocking noted. Patient was brought to ED by sister at the recommendation of outpatient provider, but details are limited and patient does not provide significant information. She does report history of mental illness and states she has been diagnosed with Schizoaffective Disorder in the past . Chart notes indicate she had recently been switched from Lithium to Depakote, but it is not clear if this change coincided with increased symptoms or psychiatric deterioration. She describes some depression, and states energy level is low, but denies changes in sleep, appetite or anhedonia. She also denies any suicidal ideations today or recently. She denies hallucinations, but presents with soft, limited speech and at times appears to briefly thought block, and possibly to be internally preoccupied based on how she looks around the room. She does present vaguely fearful, paranoid, and requesting to keep doors open during session.  After evaluation of her presenting symptoms as noted above, Paula Dickson was started on her mental health medications targeting those presenting symptoms. She received & was discharged on; Hydroxyzine 25 mg prn for anxiety, Lithium Carbonate 300 mg for mood stabilization, Lorazepam 0.5 mg for severe anxiety, Trileptal 150 mg for mood stabilization, Seroquel 400 mg for mood control & Trintellix 5 mg for depression She was enrolled & participated in the group counseling sessions being offered & held on this unit. She learned coping skills that should help her cope better after discharge. She presented other significant pre-existing medical issues that required treatment & or monitoring. She was resumed & discharged on her pertinent home medications for those health issues.  She tolerated her treatment regimen without any adverse effects or reactions reported.   Today upon her discharge evaluation, pt  shares, "I'm doing good" She denies any specific concerns. She is sleeping well. Her appetite is good. She denies any other physical complaints. She denies any SI/HI/AH/VH. She is tolerating her medications well, and she is in agreement to continue her current regimen without changes. She is in agreement to follow up mental health care & medication management on outpatient basis as noted below. She was able to engage in safety planning including plan to return to [facility] or contact emergency services if she feels unable to maintain her own safety or the safety of others. Pt had no further questions, comments, or concerns.   The patient is currently at low risk of imminent suicide. Patient denied thoughts, intent, or plan for harm to self or others, expressed significant future orientation, and expressed an ability to mobilize assistance for his needs. He is presently void of any contributing  psychiatric symptoms, cognitive difficulties, or substance use which would elevate his risk for lethality. Chronic risk for lethality is elevated in light of poor social support, poor adherence, and impulsivity. The chronic risk is presently mitigated by his ongoing desire and engagement in [facility] treatment and mobilization of support from family and friends. Chronic risk may elevate if he experiences any significant loss or worsening of symptoms, which can be managed and monitored through outpatient providers. At this time, acute risk for lethality is low and he is stable for ongoing outpatient management.    Modifiable risk factors were addressed during this hospitalization through appropriate pharmacotherapy and establishment of outpatient follow-up treatment. Some risk factors for suicide are situational (i.e. Unstable social support) or related personality pathology (i.e. Poor coping mechanisms) and thus cannot be further mitigated by continued hospitalization in this setting.   Physical Findings: AIMS: Facial and Oral  Movements Muscles of Facial Expression: None, normal Lips and Perioral Area: None, normal Jaw: None, normal Tongue: None, normal,Extremity Movements Upper (arms, wrists, hands, fingers): Minimal Lower (legs, knees, ankles, toes): None, normal, Trunk Movements Neck, shoulders, hips: None, normal, Overall Severity Severity of abnormal movements (highest score from questions above): Minimal Incapacitation due to abnormal movements: None, normal Patient's awareness of abnormal movements (rate only patient's report): Aware, no distress, Dental Status Current problems with teeth and/or dentures?: Yes Does patient usually wear dentures?: Yes  CIWA:    COWS:     Musculoskeletal: Strength & Muscle Tone: within normal limits Gait & Station: normal Patient leans: N/A  Psychiatric Specialty Exam: Review of Systems  Unable to perform ROS Constitutional: Negative.   HENT: Negative.   Eyes: Negative.   Respiratory: Negative.  Negative for cough and shortness of breath.   Cardiovascular: Negative.  Negative for chest pain and palpitations.  Gastrointestinal: Negative.  Negative for abdominal pain, heartburn, nausea and vomiting.  Genitourinary: Negative.   Musculoskeletal: Negative.   Skin: Negative.   Neurological: Negative.  Negative for dizziness and headaches.  Endo/Heme/Allergies: Negative.   Psychiatric/Behavioral: Positive for depression (Stable). Negative for hallucinations, memory loss, substance abuse and suicidal ideas. The patient has insomnia (Stable). The patient is not nervous/anxious (Stable).   All other systems reviewed and are negative.   Blood pressure 118/87, pulse 73, temperature 97.9 F (36.6 C), temperature source Oral, resp. rate 14, weight 68 kg, SpO2 100 %.Body mass index is 28.34 kg/m.  See Md's discharge SRA.   Has this patient used any form of tobacco in the last 30 days? (Cigarettes, Smokeless Tobacco, Cigars, and/or Pipes): N/A  Metabolic Disorder Labs:   Lab Results  Component Value Date   HGBA1C 5.4 11/03/2018   MPG 108.28 11/03/2018   No results found for: PROLACTIN Lab Results  Component Value Date   CHOL 150 11/03/2018   TRIG 164 (H) 11/03/2018   HDL 41 11/03/2018   CHOLHDL 3.7 11/03/2018   VLDL 33 11/03/2018   LDLCALC 76 11/03/2018   LDLCALC 84 12/10/2015   See Psychiatric Specialty Exam and Suicide Risk Assessment completed by Attending Physician prior to discharge.  Discharge destination:  Home  Is patient on multiple antipsychotic therapies at discharge:  No   Has Patient had three or more failed trials of antipsychotic monotherapy by history:  No  Recommended Plan for Multiple Antipsychotic Therapies: NA  Allergies as of 11/18/2018   No Known Allergies     Medication List    STOP taking these medications   ABILIFY MAINTENA 400 MG Srer injection Generic drug:  ARIPiprazole ER   benztropine 0.5 MG tablet Commonly known as:  COGENTIN   chlorhexidine 0.12 % solution Commonly known as:  PERIDEX   divalproex 125 MG capsule Commonly known as:  DEPAKOTE SPRINKLE   divalproex 500 MG 24 hr tablet Commonly known as:  DEPAKOTE ER   lithium carbonate 450 MG CR tablet Commonly known as:  ESKALITH Replaced by:  lithium carbonate 300 MG capsule   MULTIVITAMIN ADULT Tabs   OLANZapine 20 MG tablet Commonly known as:  ZYPREXA   PREVIDENT 5000 BOOSTER PLUS 1.1 % Pste Generic drug:  Sodium Fluoride   QUEtiapine 300 MG 24 hr tablet Commonly known as:  SEROQUEL XR Replaced by:  QUEtiapine 400 MG tablet     TAKE these medications     Indication  amLODipine 10 MG tablet Commonly known as:  NORVASC Take 1 tablet (10 mg total) by mouth daily. For high blood pressure Start taking on:  November 19, 2018 What changed:    medication strength  how much to take  additional instructions  Indication:  High Blood Pressure Disorder   antiseptic oral rinse Liqd 15 mLs by Mouth Rinse route as needed for dry  mouth.  Indication:  Dry mouth   hydrOXYzine 25 MG tablet Commonly known as:  ATARAX/VISTARIL Take 1 tablet (25 mg total) by mouth 3 (three) times daily as needed for anxiety.  Indication:  Feeling Anxious   lithium carbonate 300 MG capsule Take 1 capsule (300 mg total) by mouth 2 (two) times daily with a meal. For mood stabilization Replaces:  lithium carbonate 450 MG CR tablet  Indication:  Mood stabilization   LORazepam 0.5 MG tablet Commonly known as:  ATIVAN Take 1 tablet (0.5 mg total) by mouth 3 (three) times daily. For severe anxiety  Indication:  Agitation, Anxiety   omega-3 acid ethyl esters 1 g capsule Commonly known as:  LOVAZA Take 1 capsule (1 g total) by mouth 2 (two) times daily. For high choesterol  Indication:  High Amount of Triglycerides in the Blood   OXcarbazepine 150 MG tablet Commonly known as:  TRILEPTAL Take 0.5 tablets (75 mg total) by mouth 2 (two) times daily. For mood stabilization  Indication:  Mood stabilization   prenatal multivitamin Tabs tablet Take 1 tablet by mouth daily at 12 noon. Vitamin replacement  Indication:  Vitamin Deficiency   propranolol ER 80 MG 24 hr capsule Commonly known as:  INDERAL LA Take 1 capsule (80 mg total) by mouth daily. For high blood pressure/anxiety Start taking on:  November 19, 2018 What changed:    medication strength  how much to take  additional instructions  Another medication with the same name was removed. Continue taking this medication, and follow the directions you see here.  Indication:  High Blood Pressure Disorder, Anxiety   QUEtiapine 400 MG tablet Commonly known as:  SEROQUEL Take 1 tablet (400 mg total) by mouth at bedtime. For mood control Replaces:  QUEtiapine 300 MG 24 hr tablet  Indication:  Mood control   vortioxetine HBr 5 MG Tabs tablet Commonly known as:  TRINTELLIX Take 1 tablet (5 mg total) by mouth daily. For depression Start taking on:  November 19, 2018  Indication:   Major Depressive Disorder      Follow-up Information    [name]. Go on 11/25/2018.   Specialty:  Behavioral Health Why:  Your next hospital follow up appointment is Monday, 11/25/18 at 10:00a. Please bring: photo ID, proof of insurance, and discharge paperwork from this  hospitalization. Contact information: [address] [place] [address] [phone]          Follow-up recommendations: Activity:  As tolerated Diet: As recommended by your primary care doctor. Keep all scheduled follow-up appointments as recommended.  Comments: Patient is instructed prior to discharge to: Take all medications as prescribed by his/her mental healthcare provider. Report any adverse effects and or reactions from the medicines to his/her outpatient provider promptly. Patient has been instructed & cautioned: To not engage in alcohol and or illegal drug use while on prescription medicines. In the event of worsening symptoms, patient is instructed to call the crisis hotline, 911 and or go to the nearest ED for appropriate evaluation and treatment of symptoms. To follow-up with his/her primary care provider for your other medical issues, concerns and or health care needs.    Signed: Lindell Spar, PMHNP, FNP-BC 11/18/2018, 9:58 AM

## 2018-11-18 NOTE — Progress Notes (Signed)
1:1 discontinued note 1300  MD discontinued this patient's 1:1 for safety.  Patient went to dining room for lunch.  Patient has been steady on her feet today.  No need for wheelchair at this time.  Respirations even and unlabored.  No signs/symptoms of pain/distressed noted on patient's face/body movements.  Safety maintained with 15 minute checks.

## 2018-11-18 NOTE — Tx Team (Signed)
Interdisciplinary Treatment and Diagnostic Plan Update  11/18/2018 Time of Session: 9:00am Ingra Rother MRN: [id number]  Principal Diagnosis: Bipolar disorder, most recent episode depressed ([place])  Secondary Diagnoses: Principal Problem:   Bipolar disorder, most recent episode depressed ([place]) Active Problems:   Bipolar affective disorder, current episode mixed ([place])   Current Medications:  Current Facility-Administered Medications  Medication Dose Route Frequency Provider Last Rate Last Dose  . acetaminophen (TYLENOL) tablet 650 mg  650 mg Oral Q6H PRN Ethelene Hal, NP      . alum & mag hydroxide-simeth (MAALOX/MYLANTA) 200-200-20 MG/5ML suspension 30 mL  30 mL Oral Q4H PRN Ethelene Hal, NP      . amLODipine (NORVASC) tablet 10 mg  10 mg Oral Daily Sharma Covert, MD   10 mg at 11/18/18 0749  . antiseptic oral rinse (BIOTENE) solution 15 mL  15 mL Mouth Rinse PRN Lindon Romp A, NP      . feeding supplement (ENSURE ENLIVE) (ENSURE ENLIVE) liquid 237 mL  237 mL Oral BID BM Sharma Covert, MD   237 mL at 11/17/18 1622  . hydrOXYzine (ATARAX/VISTARIL) tablet 25 mg  25 mg Oral TID PRN Ethelene Hal, NP   25 mg at 11/10/18 2119  . lithium carbonate capsule 300 mg  300 mg Oral BID WC Sharma Covert, MD   300 mg at 11/18/18 0749  . LORazepam (ATIVAN) tablet 0.5 mg  0.5 mg Oral TID Sharma Covert, MD   0.5 mg at 11/18/18 0753  . magnesium hydroxide (MILK OF MAGNESIA) suspension 30 mL  30 mL Oral Daily PRN Ethelene Hal, NP      . omega-3 acid ethyl esters (LOVAZA) capsule 1 g  1 g Oral BID Johnn Hai, MD   1 g at 11/18/18 0749  . OXcarbazepine (TRILEPTAL) tablet 75 mg  75 mg Oral BID Sharma Covert, MD   75 mg at 11/18/18 0750  . prenatal multivitamin tablet 1 tablet  1 tablet Oral Q1200 Johnn Hai, MD   1 tablet at 11/17/18 1256  . propranolol ER (INDERAL LA) 24 hr capsule 80 mg  80 mg Oral Daily Johnn Hai, MD   80 mg at 11/18/18 0751   . QUEtiapine (SEROQUEL) tablet 400 mg  400 mg Oral QHS Sharma Covert, MD   400 mg at 11/17/18 2145  . vortioxetine HBr (TRINTELLIX) tablet 5 mg  5 mg Oral Daily Sharma Covert, MD   5 mg at 11/18/18 5462   PTA Medications: Medications Prior to Admission  Medication Sig Dispense Refill Last Dose  . ABILIFY MAINTENA 400 MG SRER injection Inject 400 mg into the muscle every 30 (thirty) days. Last 10/03/2018  2 Past Month at Unknown time  . amLODipine (NORVASC) 5 MG tablet TAKE 1 TABLET BY MOUTH EVERY DAY (Patient taking differently: Take 5 mg by mouth daily. ) 30 tablet 6 10/31/2018 at Unknown time  . benztropine (COGENTIN) 0.5 MG tablet Take 1 tablet (0.5 mg total) by mouth at bedtime. 30 tablet 0 10/30/2018 at Unknown time  . chlorhexidine (PERIDEX) 0.12 % solution 15 mLs by Mouth Rinse route 2 (two) times daily as needed (mouth pain). Rinse and spit  3 10/31/2018 at Unknown time  . divalproex (DEPAKOTE ER) 500 MG 24 hr tablet Take 1,000 mg by mouth at bedtime.  1 10/30/2018 at Unknown time  . divalproex (DEPAKOTE SPRINKLE) 125 MG capsule Take 1 capsule (125 mg total) by mouth 2 (two) times daily. (Patient  not taking: Reported on 08/11/2018)   Not Taking at Unknown time  . lithium carbonate (ESKALITH) 450 MG CR tablet Take 1 tablet (450 mg total) by mouth every 12 (twelve) hours. (Patient not taking: Reported on 07/11/2017) 60 tablet 0 Not Taking at Unknown time  . Multiple Vitamins-Minerals (MULTIVITAMIN ADULT) TABS Take 1 tablet by mouth daily.    10/31/2018 at Unknown time  . OLANZapine (ZYPREXA) 20 MG tablet Take 20 mg by mouth at bedtime.   2 10/30/2018 at Unknown time  . PREVIDENT 5000 BOOSTER PLUS 1.1 % PSTE Take 1 application by mouth 2 (two) times daily.  2 10/31/2018 at Unknown time  . propranolol (INDERAL) 20 MG tablet Take 1 tablet (20 mg total) by mouth 3 (three) times daily. (Patient not taking: Reported on 10/31/2018) 90 tablet 0 Not Taking at Unknown time  . propranolol ER (INDERAL  LA) 60 MG 24 hr capsule Take 60 mg by mouth daily.  2 10/31/2018 at 1030  . QUEtiapine (SEROQUEL XR) 300 MG 24 hr tablet Take 2 tablets (600 mg total) by mouth at bedtime. (Patient not taking: Reported on 07/11/2017) 60 tablet 0 Not Taking at Unknown time    Patient Stressors:  Aging father, health issues  Patient Strengths:  Strong communicator at baseline  Treatment Modalities: Medication Management, Group therapy, Case management,  1 to 1 session with clinician, Psychoeducation, Recreational therapy.   Physician Treatment Plan for Primary Diagnosis: Bipolar disorder, most recent episode depressed ([place])  Medication Management: Evaluate patient's response, side effects, and tolerance of medication regimen.  Therapeutic Interventions: 1 to 1 sessions, Unit Group sessions and Medication administration.  Evaluation of Outcomes: Adequate for Discharge  Physician Treatment Plan for Secondary Diagnosis: Principal Problem:   Bipolar disorder, most recent episode depressed ([place]) Active Problems:   Bipolar affective disorder, current episode mixed ([place])   Medication Management: Evaluate patient's response, side effects, and tolerance of medication regimen.  Therapeutic Interventions: 1 to 1 sessions, Unit Group sessions and Medication administration.  Evaluation of Outcomes: Adequate for Discharge   RN Treatment Plan for Primary Diagnosis: Bipolar disorder, most recent episode depressed ([place]) Long Term Goal(s): Knowledge of disease and therapeutic regimen to maintain health will improve  Short Term Goals: Ability to verbalize frustration and anger appropriately will improve, Ability to demonstrate self-control, Ability to participate in decision making will improve, Ability to verbalize feelings will improve and Compliance with prescribed medications will improve  Medication Management: RN will administer medications as ordered by provider, will assess and evaluate patient's response and  provide education to patient for prescribed medication. RN will report any adverse and/or side effects to prescribing provider.  Therapeutic Interventions: 1 on 1 counseling sessions, Psychoeducation, Medication administration, Evaluate responses to treatment, Monitor vital signs and CBGs as ordered, Perform/monitor CIWA, COWS, AIMS and Fall Risk screenings as ordered, Perform wound care treatments as ordered.  Evaluation of Outcomes: Adequate for Discharge   LCSW Treatment Plan for Primary Diagnosis: Bipolar disorder, most recent episode depressed ([place]) Long Term Goal(s): Safe transition to appropriate next level of care at discharge, Engage patient in therapeutic group addressing interpersonal concerns.  Short Term Goals: Engage patient in aftercare planning with referrals and resources, Increase social support, Increase ability to appropriately verbalize feelings, Increase emotional regulation and Increase skills for wellness and recovery  Therapeutic Interventions: Assess for all discharge needs, 1 to 1 time with Social worker, Explore available resources and support systems, Assess for adequacy in community support network, Educate family and significant other(s)  on suicide prevention, Complete Psychosocial Assessment, Interpersonal group therapy.  Evaluation of Outcomes: Adequate for Discharge   Progress in Treatment: Attending groups: Yes. Participating in groups: Yes. Taking medication as prescribed: Yes. Toleration medication: Yes. Family/Significant other contact made: Yes, individual(s) contacted:  sister, [name] Patient understands diagnosis: Yes. Discussing patient identified problems/goals with staff: Yes. Medical problems stabilized or resolved: No. Denies suicidal/homicidal ideation: Yes. Issues/concerns per patient self-inventory: Yes.  New problem(s) identified: Yes, Describe:  patient still experiencing thought blocking, further medication adjustments required  New  Short Term/Long Term Goal(s): medication management for mood stabilization; elimination of SI thoughts; development of comprehensive mental wellness/sobriety plan.  Patient Goals:  "To work on some things. I know what I need to work on."  Discharge Plan or Barriers: Likely discharging home with family.  Patient lives with her father and sister. She follows up with [facility]. Patient considering following up with [facility] for additional services. [place] pamphlet, Mobile Crisis information, and information provided to patient for additional community support and resources. CSW will continue to follow for additional referrals and possible discharge planning.   Reason for Continuation of Hospitalization: None   Estimated Length of Stay: Discharging, 11/18/2018  Attendees: Patient: Paula Dickson 11/18/2018 8:31 AM  Physician: Dr.Farah; Dr. Myles Lipps, MD 11/18/2018 8:31 AM  Nursing: Yetta Flock.[place].Loletha Grayer RN 11/18/2018 8:31 AM  RN Care Manager: Lars Pinks, RN 11/18/2018 8:31 AM  Social Worker: Stephanie Acre, [place]; [place], [place] 11/18/2018 8:31 AM  Recreational Therapist:  11/18/2018 8:31 AM  Other: Agustina Caroli, NP 11/18/2018 8:31 AM  Other:  11/18/2018 8:31 AM  Other: 11/18/2018 8:31 AM    Scribe for Treatment Team: Marylee Floras, [place] 11/18/2018 8:31 AM

## 2018-11-18 NOTE — Progress Notes (Signed)
  [facility] Adult Case Management Discharge Plan :  Will you be returning to the same living situation after discharge:  Yes,  patient reports she is returning home with her sister and father At discharge, do you have transportation home?: Yes,  patient's sister is picking the patient up at discharge Do you have the ability to pay for your medications: No.  Release of information consent forms completed and in the chart;  Patient's signature needed at discharge.  Patient to Follow up at: Follow-up Information    [name]. Go on 11/25/2018.   Specialty:  Behavioral Health Why:  Your next hospital follow up appointment is Monday, 11/25/18 at 10:00a. Please bring: photo ID, proof of insurance, and discharge paperwork from this hospitalization. Contact information: [place] [address] [phone]           Next level of care provider has access to [place] and Suicide Prevention discussed: Yes,  with the patient's sister     Has patient been referred to the Quitline?: N/A patient is not a smoker  Patient has been referred for addiction treatment: N/A  Marylee Floras, [place] 11/18/2018, 10:03 AM

## 2018-12-09 ENCOUNTER — Ambulatory Visit
Admission: RE | Admit: 2018-12-09 | Discharge: 2018-12-09 | Disposition: A | Discharge status: Home / Self Care | Payer: Medicare Other | Source: Ambulatory Visit | Attending: Internal Medicine | Admitting: Internal Medicine

## 2018-12-09 ENCOUNTER — Ambulatory Visit: Payer: Self-pay

## 2018-12-09 DIAGNOSIS — R928 Other abnormal and inconclusive findings on diagnostic imaging of breast: Secondary | ICD-10-CM

## 2019-01-16 ENCOUNTER — Other Ambulatory Visit: Payer: Self-pay | Admitting: Internal Medicine

## 2019-01-16 DIAGNOSIS — Z1231 Encounter for screening mammogram for malignant neoplasm of breast: Secondary | ICD-10-CM

## 2019-03-13 ENCOUNTER — Inpatient Hospital Stay (HOSPITAL_COMMUNITY)
Admission: RE | Admit: 2019-03-13 | Discharge: 2019-04-01 | DRG: 885 | Disposition: A | Discharge status: Home / Self Care | Payer: Medicare Other | Attending: Psychiatry | Admitting: Psychiatry

## 2019-03-13 ENCOUNTER — Other Ambulatory Visit: Payer: Self-pay

## 2019-03-13 ENCOUNTER — Encounter (HOSPITAL_COMMUNITY): Payer: Self-pay

## 2019-03-13 DIAGNOSIS — I10 Essential (primary) hypertension: Secondary | ICD-10-CM | POA: Diagnosis present

## 2019-03-13 DIAGNOSIS — F312 Bipolar disorder, current episode manic severe with psychotic features: Secondary | ICD-10-CM | POA: Diagnosis present

## 2019-03-13 DIAGNOSIS — F3163 Bipolar disorder, current episode mixed, severe, without psychotic features: Secondary | ICD-10-CM | POA: Diagnosis present

## 2019-03-13 DIAGNOSIS — Z803 Family history of malignant neoplasm of breast: Secondary | ICD-10-CM | POA: Diagnosis not present

## 2019-03-13 DIAGNOSIS — Z818 Family history of other mental and behavioral disorders: Secondary | ICD-10-CM

## 2019-03-13 DIAGNOSIS — F259 Schizoaffective disorder, unspecified: Secondary | ICD-10-CM | POA: Diagnosis present

## 2019-03-13 DIAGNOSIS — M1990 Unspecified osteoarthritis, unspecified site: Secondary | ICD-10-CM | POA: Diagnosis present

## 2019-03-13 MED ORDER — ZIPRASIDONE MESYLATE 20 MG IM SOLR
10.0000 mg | INTRAMUSCULAR | Status: DC | PRN
  Filled 2019-03-13: qty 20

## 2019-03-13 MED ORDER — LITHIUM CITRATE 300 MG/5 ML PO SYRP
300.0000 mg | Freq: Two times a day (BID) | ORAL | Status: DC
  Filled 2019-03-13 (×2): qty 5

## 2019-03-13 MED ORDER — HYDROXYZINE HCL 25 MG PO TABS: 25.0000 mg | ORAL_TABLET | Freq: Three times a day (TID) | ORAL | Status: DC | PRN

## 2019-03-13 MED ORDER — MAGNESIUM HYDROXIDE 400 MG/5ML PO SUSP: 30.0000 mL | Freq: Every day | ORAL | Status: DC | PRN

## 2019-03-13 MED ORDER — LORAZEPAM 1 MG PO TABS
1.0000 mg | ORAL_TABLET | ORAL | Status: DC | PRN
  Filled 2019-03-13: qty 1

## 2019-03-13 MED ORDER — ZIPRASIDONE MESYLATE 20 MG IM SOLR: 20.0000 mg | Freq: Four times a day (QID) | INTRAMUSCULAR | Status: DC | PRN

## 2019-03-13 MED ORDER — LORAZEPAM 2 MG/ML IJ SOLN
4.0000 mg | Freq: Once | INTRAMUSCULAR | Status: AC | PRN
  Administered 2019-03-13: 4 mg via INTRAMUSCULAR

## 2019-03-13 MED ORDER — LORAZEPAM 2 MG/ML IJ SOLN: 2.0000 mg | Freq: Once | INTRAMUSCULAR | Status: DC | PRN

## 2019-03-13 MED ORDER — QUETIAPINE FUMARATE 400 MG PO TABS
400.0000 mg | ORAL_TABLET | Freq: Every day | ORAL | Status: DC
  Filled 2019-03-13: qty 2

## 2019-03-13 MED ORDER — ACETAMINOPHEN 325 MG PO TABS: 650.0000 mg | ORAL_TABLET | Freq: Four times a day (QID) | ORAL | Status: DC | PRN

## 2019-03-13 MED ORDER — QUETIAPINE FUMARATE 100 MG PO TABS: 100.0000 mg | ORAL_TABLET | ORAL | Status: DC | PRN

## 2019-03-13 MED ORDER — CLONAZEPAM 1 MG PO TABS: 2.0000 mg | ORAL_TABLET | Freq: Three times a day (TID) | ORAL | Status: DC

## 2019-03-13 MED ORDER — DIPHENHYDRAMINE HCL 50 MG/ML IJ SOLN
INTRAMUSCULAR | Status: AC
  Administered 2019-03-13: 50 mg via INTRAMUSCULAR
  Filled 2019-03-13: qty 1

## 2019-03-13 MED ORDER — ZIPRASIDONE MESYLATE 20 MG IM SOLR
20.0000 mg | Freq: Once | INTRAMUSCULAR | Status: AC | PRN
  Administered 2019-03-13: 20 mg via INTRAMUSCULAR

## 2019-03-13 MED ORDER — ALUM & MAG HYDROXIDE-SIMETH 200-200-20 MG/5ML PO SUSP: 30.0000 mL | ORAL | Status: DC | PRN

## 2019-03-13 MED ORDER — LORAZEPAM 2 MG/ML IJ SOLN
INTRAMUSCULAR | Status: AC
  Filled 2019-03-13: qty 2

## 2019-03-13 MED ORDER — DIPHENHYDRAMINE HCL 50 MG/ML IJ SOLN
50.0000 mg | Freq: Once | INTRAMUSCULAR | Status: AC | PRN
  Administered 2019-03-13: 50 mg via INTRAMUSCULAR

## 2019-03-13 MED ORDER — OLANZAPINE 10 MG PO TBDP
10.0000 mg | ORAL_TABLET | Freq: Three times a day (TID) | ORAL | Status: DC | PRN
  Filled 2019-03-13: qty 1

## 2019-03-13 MED ORDER — AMLODIPINE BESYLATE 5 MG PO TABS: 5.0000 mg | ORAL_TABLET | Freq: Every day | ORAL | Status: DC

## 2019-03-13 MED ORDER — ZIPRASIDONE MESYLATE 20 MG IM SOLR: 10.0000 mg | Freq: Once | INTRAMUSCULAR | Status: DC | PRN

## 2019-03-13 NOTE — H&P (Signed)
Behavioral Health Medical Screening Exam  Paula Dickson is an 53 y.o. female. Pt presented to [facility], under IVC, escorted by [facility]. Per the IVC, she has been off her medications and destroying property at her home. Pt is not redirectable and is resistant to everything and everyone. She is labile, loud, agitated, aggressive, and yelling at staff and the doctors.She stated she does not take medications because she "believes in a higher power." Pt stated she has not been getting any rest and she just wants to be left alone to sleep. She is refusing emergency PO medications so IM medications were given. She has a history of Bipolar 1 Disorder with Mania. Pt will be placed on the inpatient unit for stabilization and medication management.   Total Time spent with patient: 30 minutes  Psychiatric Specialty Exam: Physical Exam  Constitutional: She is oriented to person, place, and time. She appears well-developed and well-nourished.  HENT:  Head: Normocephalic.  Respiratory: Effort normal.  Musculoskeletal: Normal range of motion.  Neurological: She is alert and oriented to person, place, and time.  Psychiatric: Her mood appears anxious. Her affect is angry and labile. Her speech is rapid and/or pressured. She is agitated. Thought content is paranoid. Cognition and memory are normal.    Review of Systems  Psychiatric/Behavioral: The patient is nervous/anxious.   All other systems reviewed and are negative.   Blood pressure (!) 164/119, pulse (!) 108, temperature 98.2 F (36.8 C), temperature source Oral, resp. rate 18.There is no height or weight on file to calculate BMI.  General Appearance: Disheveled  Eye Contact:  Fair  Speech:  Pressured  Volume:  Increased  Mood:  Angry, Anxious and Irritable  Affect:  Congruent and Labile  Thought Process:  Disorganized and Descriptions of Associations: Tangential  Orientation:  Full (Time, Place, and Person)  Thought  Content:  Illogical and Tangential  Suicidal Thoughts:  Unable to assess due to Pt's labile behavior  Homicidal Thoughts:  Unable to assess due to Pt's labile behavior  Memory:  Immediate;   Fair Recent;   Fair Remote;   Fair  Judgement:  Poor  Insight:  Shallow  Psychomotor Activity:  Increased  Concentration: Concentration: Fair and Attention Span: Fair  Recall:  [employer] of Knowledge:Fair  Language: Good  Akathisia:  No  Handed:  Right  AIMS (if indicated):     Assets:  Agricultural consultant Housing Social Support  Sleep:   Poor    Musculoskeletal: Strength & Muscle Tone: increased Gait & Station: normal Patient leans: N/A  Blood pressure (!) 164/119, pulse (!) 108, temperature 98.2 F (36.8 C), temperature source Oral, resp. rate 18.  Recommendations:  Based on my evaluation the patient does not appear to have an emergency medical condition. Pt will be admitted to the inpatient adult unit for stabilization.  Ethelene Hal, NP 03/13/2019, 4:30 PM

## 2019-03-13 NOTE — Progress Notes (Signed)
  D: Pt observed sleeping in bed with eyes closed. RR even and unlabored. No distress noted  .  A: Q 15 minute checks were done for safety.  R: safety maintained on obs unit.
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
## 2019-03-13 NOTE — BH Assessment (Signed)
[place] Assessment Progress Note   Patient is still sleeping soundly.  Unable to be assessed at this time.

## 2019-03-13 NOTE — BH Assessment (Signed)
[facility] Assessment Progress Note  Per Neita Garnet, MD, this pt requires psychiatric hospitalization.  Letitia Libra, RN, [facility] anticipates that a bed will become available for this pt at [facility], once she is medicated and de-escalates.  Pt presents under IVC initiated by pt's sister, and upheld by Dr Parke Poisson.  Final disposition is pending as of this writing.  Pt's nurse, Edd Arbour, has been notified.   Jalene Mullet, [place] Coordinator [phone]

## 2019-03-13 NOTE — Progress Notes (Signed)
During admission patient presented as increasingly agitated in the company of [employer].  Staff attempted to de-escalate patient, but patient was unable to follow staff redirection and required physical support for medication administration.  Patient received medication and is currently resting comfortably.

## 2019-03-14 ENCOUNTER — Inpatient Hospital Stay (HOSPITAL_COMMUNITY)
Admission: AD | Admit: 2019-03-14 | Disposition: A | Discharge status: Other Acute Inpatient Hospital | Payer: Medicare Other | Source: Intra-hospital | Attending: Psychiatry | Admitting: Psychiatry

## 2019-03-14 ENCOUNTER — Inpatient Hospital Stay (HOSPITAL_COMMUNITY): Admission: RE | Admit: 2019-03-14 | Payer: Medicare Other | Source: Ambulatory Visit | Admitting: Psychiatry

## 2019-03-14 LAB — LIPID PANEL
Cholesterol: 152 mg/dL (ref 0–200)
HDL: 50 mg/dL (ref >40)
LDL Cholesterol: 78 mg/dL (ref 0–99)
Total CHOL/HDL Ratio: 3 RATIO
Triglycerides: 120 mg/dL (ref <150)
VLDL: 24 mg/dL (ref 0–40)

## 2019-03-14 LAB — LITHIUM LEVEL: Lithium Lvl: 0.43 mmol/L — ABNORMAL LOW (ref 0.60–1.20)

## 2019-03-14 LAB — COMPREHENSIVE METABOLIC PANEL
ALT: 29 U/L (ref 0–44)
AST: 28 U/L (ref 15–41)
Albumin: 4.5 g/dL (ref 3.5–5.0)
Alkaline Phosphatase: 90 U/L (ref 38–126)
Anion gap: 10 (ref 5–15)
BUN: 17 mg/dL (ref 6–20)
CO2: 24 mmol/L (ref 22–32)
Calcium: 10.3 mg/dL (ref 8.9–10.3)
Chloride: 107 mmol/L (ref 98–111)
Creatinine, Ser: 1.24 mg/dL — ABNORMAL HIGH (ref 0.44–1.00)
GFR calc Af Amer: 57 mL/min — ABNORMAL LOW (ref >60)
GFR calc non Af Amer: 49 mL/min — ABNORMAL LOW (ref >60)
Glucose, Bld: 143 mg/dL — ABNORMAL HIGH (ref 70–99)
Potassium: 3.7 mmol/L (ref 3.5–5.1)
Sodium: 141 mmol/L (ref 135–145)
Total Bilirubin: 0.8 mg/dL (ref 0.3–1.2)
Total Protein: 7.9 g/dL (ref 6.5–8.1)

## 2019-03-14 LAB — CBC
HCT: 42.8 % (ref 36.0–46.0)
Hemoglobin: 13.6 g/dL (ref 12.0–15.0)
MCH: 28.7 pg (ref 26.0–34.0)
MCHC: 31.8 g/dL (ref 30.0–36.0)
MCV: 90.3 fL (ref 80.0–100.0)
Platelets: 246 10*3/uL (ref 150–400)
RBC: 4.74 MIL/uL (ref 3.87–5.11)
RDW: 13 % (ref 11.5–15.5)
WBC: 8.2 10*3/uL (ref 4.0–10.5)
nRBC: 0 % (ref 0.0–0.2)

## 2019-03-14 LAB — T4, FREE: Free T4: 1 ng/dL (ref 0.82–1.77)

## 2019-03-14 LAB — HEMOGLOBIN A1C
Hgb A1c MFr Bld: 5.6 % (ref 4.8–5.6)
Mean Plasma Glucose: 114.02 mg/dL

## 2019-03-14 LAB — URINALYSIS, ROUTINE W REFLEX MICROSCOPIC
Bilirubin Urine: NEGATIVE
Glucose, UA: NEGATIVE mg/dL
Hgb urine dipstick: NEGATIVE
Ketones, ur: NEGATIVE mg/dL
Leukocytes,Ua: NEGATIVE
Nitrite: NEGATIVE
Protein, ur: NEGATIVE mg/dL
Specific Gravity, Urine: 1.01 (ref 1.005–1.030)
pH: 6 (ref 5.0–8.0)

## 2019-03-14 LAB — RAPID URINE DRUG SCREEN, HOSP PERFORMED
Amphetamines: NOT DETECTED
Barbiturates: NOT DETECTED
Benzodiazepines: POSITIVE — AB
Cocaine: NOT DETECTED
Opiates: NOT DETECTED
Tetrahydrocannabinol: NOT DETECTED

## 2019-03-14 LAB — TSH: TSH: 0.63 u[IU]/mL (ref 0.350–4.500)

## 2019-03-14 MED ORDER — QUETIAPINE FUMARATE 400 MG PO TABS
400.0000 mg | ORAL_TABLET | Freq: Every day | ORAL | Status: DC
  Filled 2019-03-14: qty 1

## 2019-03-14 MED ORDER — LORAZEPAM 0.5 MG PO TABS
0.5000 mg | ORAL_TABLET | Freq: Three times a day (TID) | ORAL | Status: DC
  Filled 2019-03-14: qty 1

## 2019-03-14 MED ORDER — HYDROXYZINE HCL 25 MG PO TABS
25.0000 mg | ORAL_TABLET | Freq: Three times a day (TID) | ORAL | Status: DC | PRN
  Administered 2019-03-26 – 2019-03-27 (×2): 25 mg via ORAL
  Filled 2019-03-14: qty 1

## 2019-03-14 MED ORDER — PROPRANOLOL HCL ER 60 MG PO CP24
120.0000 mg | ORAL_CAPSULE | Freq: Every day | ORAL | Status: DC
  Administered 2019-03-15: 60 mg via ORAL
  Filled 2019-03-14: qty 1
  Filled 2019-03-14: qty 2
  Filled 2019-03-14: qty 1
  Filled 2019-03-14: qty 2

## 2019-03-14 MED ORDER — OXCARBAZEPINE 150 MG PO TABS
75.0000 mg | ORAL_TABLET | Freq: Two times a day (BID) | ORAL | Status: DC
  Administered 2019-03-14: 75 mg via ORAL
  Filled 2019-03-14 (×3): qty 0.5

## 2019-03-14 MED ORDER — LITHIUM CARBONATE 300 MG PO CAPS
300.0000 mg | ORAL_CAPSULE | Freq: Two times a day (BID) | ORAL | Status: DC
  Administered 2019-03-14 – 2019-03-22 (×10): 300 mg via ORAL
  Filled 2019-03-14 (×23): qty 1

## 2019-03-14 MED ORDER — TEMAZEPAM 15 MG PO CAPS
30.0000 mg | ORAL_CAPSULE | Freq: Every day | ORAL | Status: DC
  Filled 2019-03-14: qty 2

## 2019-03-14 MED ORDER — BENZTROPINE MESYLATE 0.5 MG PO TABS
0.5000 mg | ORAL_TABLET | Freq: Two times a day (BID) | ORAL | Status: DC
  Administered 2019-03-22: 0.5 mg via ORAL
  Filled 2019-03-14 (×21): qty 1

## 2019-03-14 MED ORDER — CLONAZEPAM 1 MG PO TABS: 2.0000 mg | ORAL_TABLET | Freq: Once | ORAL | Status: DC

## 2019-03-14 MED ORDER — VORTIOXETINE HBR 5 MG PO TABS
5.0000 mg | ORAL_TABLET | Freq: Every day | ORAL | Status: DC
  Administered 2019-03-14 – 2019-03-19 (×2): 5 mg via ORAL
  Filled 2019-03-14 (×11): qty 1

## 2019-03-14 MED ORDER — LORAZEPAM 2 MG/ML IJ SOLN: 4.0000 mg | Freq: Once | INTRAMUSCULAR | Status: DC

## 2019-03-14 MED ORDER — FLUPHENAZINE HCL 5 MG PO TABS
5.0000 mg | ORAL_TABLET | Freq: Three times a day (TID) | ORAL | Status: DC
  Filled 2019-03-14 (×12): qty 1

## 2019-03-14 MED ORDER — PROPRANOLOL HCL ER 80 MG PO CP24
80.0000 mg | ORAL_CAPSULE | Freq: Every day | ORAL | Status: DC
  Administered 2019-03-14: 80 mg via ORAL
  Filled 2019-03-14 (×3): qty 1

## 2019-03-14 MED ORDER — LORAZEPAM 1 MG PO TABS: 1.0000 mg | ORAL_TABLET | Freq: Three times a day (TID) | ORAL | Status: DC

## 2019-03-14 MED ORDER — AMLODIPINE BESYLATE 10 MG PO TABS
10.0000 mg | ORAL_TABLET | Freq: Every day | ORAL | Status: DC
  Administered 2019-03-14 – 2019-03-23 (×7): 10 mg via ORAL
  Filled 2019-03-14: qty 1
  Filled 2019-03-14: qty 2
  Filled 2019-03-14 (×10): qty 1

## 2019-03-14 NOTE — Progress Notes (Signed)
Patient increasingly agitated and hostile. Will not sign admit paperwork. "You're just trying to get me to sign so you can restrain me." Will place on chart and update day RN.

## 2019-03-14 NOTE — Tx Team (Signed)
Initial Treatment Plan 03/14/2019 0400 [address] Kog[address] Fandrich CBS:[id number]    PATIENT STRESSORS: Marital or family conflict Medication change or noncompliance   PATIENT STRENGTHS: Average or above average intelligence Communication skills General fund of knowledge Physical Health   PATIENT IDENTIFIED PROBLEMS:   "I need to get back to doing for myself. They won't let me do for myself at home. I also need my seroquel adjusted. It's too high."                   DISCHARGE CRITERIA:  Improved stabilization in mood, thinking, and/or behavior Need for constant or close observation no longer present Reduction of life-threatening or endangering symptoms to within safe limits Verbal commitment to aftercare and medication compliance  PRELIMINARY DISCHARGE PLAN: Attend aftercare/continuing care group Return to previous living arrangement  PATIENT/FAMILY INVOLVEMENT: This treatment plan has been presented to and reviewed with the patient, Paula Dickson, and/or family member.  The patient and family have been given the opportunity to ask questions and make suggestions.  Jamie Kato, RN 03/14/2019, 4:12 AM

## 2019-03-14 NOTE — Progress Notes (Addendum)
Patient transferred from [facility] observation unit at 0200. Patient initially presents as organized, pleasant and cooperative. Patient had been sleeping prior to transfer. As patient has settled in and is more alert, patient noticeably more agitated, labile, and is overheard speaking angrily and loudly to [facility] in room. Patient confused as to where her room is insisting it is across the hall from her actual room. Patient has periods where she is quiet lucid however then becomes confused and disoriented.  Denies pain, chronic health conditions though unclear if history is accurate. Patient denies respiratory symptoms, COVID-19 screening is negative.  Level III obs initiated. Oriented to unit and emotional support provided. Reassured of safety. Fall prevention plan reviewed and in place as patient is a high fall risk.  Patient verbalizes understanding of POC. Patient continues to deny psychiatric symptoms. No SI/HI. Remains safe at this time.

## 2019-03-14 NOTE — Progress Notes (Signed)
SPIRITUAL CARE GROUP    spiritual care group facilitated by chaplain Jerene Pitch, MDiv, [facility]    Group focused on topic of [place].  Pts engaged in facilitated dialog around meaning of hope.  Participated in visual explorer exercise to define hope for themselves today.   Paula Dickson entered group room about half way through group.  When prompted about group topic, she spoke briefly about worrying about her father.  She left group room shortly after and did not return.     Jerene Pitch, MDiv, [facility]

## 2019-03-14 NOTE — H&P (Signed)
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  Though her last exacerbation involve depression delusion and near catatonic symptoms at times, this exacerbation involves mania, yelling, threatening behaviors, and she is intensely focused on her sister, the petitioner, and her anger is mainly directed at her and any healthcare provider who is examining her. Petition indicates she is off of her medication and destroying property at the home she was described as "labile loud agitated aggressive yelling at staff and doctors" and refusing medications.  Insisting she believes in a higher power and did not need them.  She was put on an empty wart in the child's section for a brief period of time because she was simply so disruptive that we felt she would be very corrosive to the mail you particular since we have manic patients already on the ward refusing medications and receiving forced medications.  She received IM Geodon and Ativan yesterday and did improve somewhat and she has become more compliant with her medications.  This morning she remains angry and irritable focused on her sisters misdeeds and her sisters lack of support of her father so forth stating her sister "tells lies on her" and everybody in the church knows it.  She is also  overheard on the phone argumentative with sister or and/or other family members presumably.  At this point she is semicooperative with the interview process and the team meeting but irritability is the main manifestation now.  Somewhat paranoid denying hallucinations somewhat insulted by the more basic questions of the mental status exam so it is as complete as it can be.  No EPS or TD  Associated Signs/Symptoms: Depression Symptoms:  psychomotor agitation, (Hypo) Manic Symptoms:  Flight of Ideas, Anxiety Symptoms:  n/a Psychotic Symptoms:  Paranoia, PTSD Symptoms: NA Total Time spent with patient: 45 minutes  Past Psychiatric History: Clearly a bipolar/schizoaffective type condition again as recently as December she was in a near catatonic state  Is the patient at risk to self? Yes.    Has the patient been a risk to self in the past 6 months? Yes.    Has the patient been a risk to self within the distant past? Yes.    Is the patient a risk to others? Yes.    Has the patient been a risk to others in the past 6 months? No.  Has the patient been a risk to others within the distant past? No.   Prior Inpatient Therapy: Prior Inpatient Therapy: Yes Prior Therapy Dates: 2019 Prior Therapy Facilty/Provider(s): [facility] Reason for Treatment: Bipolar Prior Outpatient Therapy: Prior Outpatient Therapy: Yes Prior Therapy Dates: Current? Prior Therapy Facilty/Provider(s): [facility] Reason for Treatment: med management Does patient have an ACCT team?: No Does patient have Intensive In-House Services?  : No Does patient have [name] services? : No Does patient have P4CC services?: No  Alcohol Screening: 1. How often  do you have a drink containing alcohol?: Never 2. How many drinks containing alcohol do you have on a typical day when you are drinking?: 1 or 2 3. How often do you have six or more drinks on one occasion?: Never AUDIT-C Score: 0 4. How often during the  last year have you found that you were not able to stop drinking once you had started?: Never 5. How often during the last year have you failed to do what was normally expected from you becasue of drinking?: Never 6. How often during the last year have you needed a first drink in the morning to get yourself going after a heavy drinking session?: Never 7. How often during the last year have you had a feeling of guilt of remorse after drinking?: Never 8. How often during the last year have you been unable to remember what happened the night before because you had been drinking?: Never 9. Have you or someone else been injured as a result of your drinking?: No 10. Has a relative or friend or a doctor or another health worker been concerned about your drinking or suggested you cut down?: No Alcohol Use Disorder Identification Test Final Score (AUDIT): 0 Alcohol Brief Interventions/Follow-up: AUDIT Score <7 follow-up not indicated Substance Abuse History in the last 12 months:  No. Consequences of Substance Abuse: NA Previous Psychotropic Medications: Yes  Psychological Evaluations: No  Past Medical History:  Past Medical History:  Diagnosis Date  . Bipolar affective disorder ([place])   . History of arthritis   . History of chicken pox   . History of depression   . History of genital warts   . history of heart murmur   . History of high blood pressure   . History of thyroid disease   . History of UTI   . Hypertension   . Low TSH level 07/13/2017  . Schizophrenia [facility])     Past Surgical History:  Procedure Laterality Date  . ABLATION ON ENDOMETRIOSIS    . CYST REMOVAL NECK     around 11 years ago /benign  . MULTIPLE TOOTH EXTRACTIONS     Family History:  Family History  Problem Relation Age of Onset  . Arthritis Father   . Hyperlipidemia Father   . High blood pressure Father   . Diabetes Sister   . Diabetes Mother   . Diabetes Brother   . Mental illness Brother   . Alcohol abuse  Paternal Uncle   . Alcohol abuse Paternal Grandfather   . Breast cancer Maternal Aunt   . Breast cancer Paternal Aunt   . High blood pressure Sister   . Mental illness Other        runs in family   Family Psychiatric  History: ukn Tobacco Screening: Have you used any form of tobacco in the last 30 days? (Cigarettes, Smokeless Tobacco, Cigars, and/or Pipes): No Social History:  Social History   Substance and Sexual Activity  Alcohol Use No     Social History   Substance and Sexual Activity  Drug Use No    Additional Social History: Marital status: Single    Pain Medications: See PTA medication list Prescriptions: See PTA medication list Over the Counter: See PTA medication list History of alcohol / drug use?: No history of alcohol / drug abuse(Pt denies.)                    Allergies:  No Known Allergies Lab Results:  Results for orders  placed or performed during the hospital encounter of 03/13/19 (from the past 48 hour(s))  Urine rapid drug screen (hosp performed)     Status: Abnormal   Collection Time: 03/14/19  4:27 AM  Result Value Ref Range   Opiates NONE DETECTED NONE DETECTED   Cocaine NONE DETECTED NONE DETECTED   Benzodiazepines POSITIVE (A) NONE DETECTED   Amphetamines NONE DETECTED NONE DETECTED   Tetrahydrocannabinol NONE DETECTED NONE DETECTED   Barbiturates NONE DETECTED NONE DETECTED    Comment: (NOTE) DRUG SCREEN FOR MEDICAL PURPOSES ONLY.  IF CONFIRMATION IS NEEDED FOR ANY PURPOSE, NOTIFY LAB WITHIN 5 DAYS. LOWEST DETECTABLE LIMITS FOR URINE DRUG SCREEN Drug Class                     Cutoff (ng/mL) Amphetamine and metabolites    1000 Barbiturate and metabolites    200 Benzodiazepine                 665 Tricyclics and metabolites     300 Opiates and metabolites        300 Cocaine and metabolites        300 THC                            50 Performed at [facility], [place] [address] [place], [place] [address]    Urinalysis, Routine w reflex microscopic     Status: None   Collection Time: 03/14/19  4:27 AM  Result Value Ref Range   Color, Urine YELLOW YELLOW   APPearance CLEAR CLEAR   Specific Gravity, Urine 1.010 1.005 - 1.030   pH 6.0 5.0 - 8.0   Glucose, UA NEGATIVE NEGATIVE mg/dL   Hgb urine dipstick NEGATIVE NEGATIVE   Bilirubin Urine NEGATIVE NEGATIVE   Ketones, ur NEGATIVE NEGATIVE mg/dL   Protein, ur NEGATIVE NEGATIVE mg/dL   Nitrite NEGATIVE NEGATIVE   Leukocytes,Ua NEGATIVE NEGATIVE    Comment: Performed at [place] [address], [place], [place] [address]  CBC     Status: None   Collection Time: 03/14/19  6:30 AM  Result Value Ref Range   WBC 8.2 4.0 - 10.5 K/uL   RBC 4.74 3.87 - 5.11 MIL/uL   Hemoglobin 13.6 12.0 - 15.0 g/dL   HCT 42.8 36.0 - 46.0 %   MCV 90.3 80.0 - 100.0 fL   MCH 28.7 26.0 - 34.0 pg   MCHC 31.8 30.0 - 36.0 g/dL   RDW 13.0 11.5 - 15.5 %   Platelets 246 150 - 400 K/uL   nRBC 0.0 0.0 - 0.2 %    Comment: Performed at [facility], [place] [address], [place], [place] [address]  Comprehensive metabolic panel     Status: Abnormal   Collection Time: 03/14/19  6:30 AM  Result Value Ref Range   Sodium 141 135 - 145 mmol/L   Potassium 3.7 3.5 - 5.1 mmol/L   Chloride 107 98 - 111 mmol/L   CO2 24 22 - 32 mmol/L   Glucose, Bld 143 (H) 70 - 99 mg/dL   BUN 17 6 - 20 mg/dL   Creatinine, Ser 1.24 (H) 0.44 - 1.00 mg/dL   Calcium 10.3 8.9 - 10.3 mg/dL   Total Protein 7.9 6.5 - 8.1 g/dL   Albumin 4.5 3.5 - 5.0 g/dL   AST 28 15 - 41 U/L   ALT 29 0 - 44 U/L  Alkaline Phosphatase 90 38 - 126 U/L   Total Bilirubin 0.8 0.3 - 1.2 mg/dL   GFR calc non Af Amer 49 (L) >60 mL/min   GFR calc Af Amer 57 (L) >60 mL/min   Anion gap 10 5 - 15    Comment: Performed at [facility], [place] [address] [place], [place] [address]  T4, free     Status: None   Collection Time: 03/14/19  6:30 AM  Result Value Ref Range    Free T4 1.00 0.82 - 1.77 ng/dL    Comment: (NOTE) Biotin ingestion may interfere with free T4 tests. If the results are inconsistent with the TSH level, previous test results, or the clinical presentation, then consider biotin interference. If needed, order repeat testing after stopping biotin. Performed at [facility], [place] [address] [place], [place] [address]   Hemoglobin A1c     Status: None   Collection Time: 03/14/19  6:30 AM  Result Value Ref Range   Hgb A1c MFr Bld 5.6 4.8 - 5.6 %    Comment: (NOTE) Pre diabetes:          5.7%-6.4% Diabetes:              >6.4% Glycemic control for   <7.0% adults with diabetes    Mean Plasma Glucose 114.02 mg/dL    Comment: Performed at [place] [address] [place], [place] [address]  Lithium level     Status: Abnormal   Collection Time: 03/14/19  6:30 AM  Result Value Ref Range   Lithium Lvl 0.43 (L) 0.60 - 1.20 mmol/L    Comment: Performed at [name], [place] [address] [place], [place] [address]  Lipid panel     Status: None   Collection Time: 03/14/19  6:30 AM  Result Value Ref Range   Cholesterol 152 0 - 200 mg/dL   Triglycerides 120 <150 mg/dL   HDL 50 >40 mg/dL   Total CHOL/HDL Ratio 3.0 RATIO   VLDL 24 0 - 40 mg/dL   LDL Cholesterol 78 0 - 99 mg/dL    Comment:        Total Cholesterol/HDL:CHD Risk Coronary Heart Disease Risk Table                     Men   Women  1/2 Average Risk   3.4   3.3  Average Risk       5.0   4.4  2 X Average Risk   9.6   7.1  3 X Average Risk  23.4   11.0        Use the calculated Patient Ratio above and the CHD Risk Table to determine the patient's CHD Risk.        ATP III CLASSIFICATION (LDL):  <100     mg/dL   Optimal  100-129  mg/dL   Near or Above                    Optimal  130-159  mg/dL   Borderline  160-189  mg/dL   High  >190     mg/dL   Very High Performed at [place] [address], [place], [place] [address]    TSH     Status: None   Collection Time: 03/14/19  6:30 AM  Result Value Ref Range   TSH 0.630 0.350 - 4.500 uIU/mL    Comment: Performed by a  3rd Generation assay with a functional sensitivity of <=0.01 uIU/mL. Performed at [facility], [place] [address], [place], [place] [address]     Blood Alcohol level:  Lab Results  Component Value Date   [facility] <10 10/31/2018   ETH <5 60/45/4098    Metabolic Disorder Labs:  Lab Results  Component Value Date   HGBA1C 5.6 03/14/2019   MPG 114.02 03/14/2019   MPG 108.28 11/03/2018   No results found for: PROLACTIN Lab Results  Component Value Date   CHOL 152 03/14/2019   TRIG 120 03/14/2019   HDL 50 03/14/2019   CHOLHDL 3.0 03/14/2019   VLDL 24 03/14/2019   LDLCALC 78 03/14/2019   LDLCALC 76 11/03/2018    Current Medications: Current Facility-Administered Medications  Medication Dose Route Frequency Provider Last Rate Last Dose  . amLODipine (NORVASC) tablet 10 mg  10 mg Oral Daily Laverle Hobby, PA-C   10 mg at 03/14/19 0747  . benztropine (COGENTIN) tablet 0.5 mg  0.5 mg Oral BID Johnn Hai, MD      . fluPHENAZine (PROLIXIN) tablet 5 mg  5 mg Oral TID Johnn Hai, MD      . hydrOXYzine (ATARAX/VISTARIL) tablet 25 mg  25 mg Oral TID PRN Laverle Hobby, PA-C      . lithium carbonate capsule 300 mg  300 mg Oral BID WC Patriciaann Clan E, PA-C   300 mg at 03/14/19 0747  . LORazepam (ATIVAN) tablet 1 mg  1 mg Oral TID Johnn Hai, MD      . propranolol ER (INDERAL LA) 24 hr capsule 80 mg  80 mg Oral Daily Simon, Spencer E, PA-C      . temazepam (RESTORIL) capsule 30 mg  30 mg Oral QHS Johnn Hai, MD      . vortioxetine HBr (TRINTELLIX) tablet 5 mg  5 mg Oral Daily Laverle Hobby, PA-C   5 mg at 03/14/19 1191   PTA Medications: Medications Prior to Admission  Medication Sig Dispense Refill Last Dose  . amLODipine (NORVASC) 10 MG tablet Take 1 tablet (10 mg total) by mouth daily. For high blood pressure      . lithium carbonate 300 MG capsule Take 1 capsule (300 mg total) by mouth 2 (two) times daily with a meal. For mood stabilization 60 capsule 0   . OXcarbazepine (TRILEPTAL) 150 MG tablet Take 0.5 tablets (75 mg total) by mouth 2 (two) times daily. For mood stabilization 60 tablet 0   . propranolol ER (INDERAL LA) 80 MG 24 hr capsule Take 1 capsule (80 mg total) by mouth daily. For high blood pressure/anxiety 20 capsule 0   . QUEtiapine (SEROQUEL) 400 MG tablet Take 1 tablet (400 mg total) by mouth at bedtime. For mood control (Patient taking differently: Take 200 mg by mouth at bedtime. For mood control) 30 tablet 0   . vortioxetine HBr (TRINTELLIX) 5 MG TABS tablet Take 1 tablet (5 mg total) by mouth daily. For depression 30 tablet 0     Musculoskeletal: Strength & Muscle Tone: within normal limits Gait & Station: normal Patient leans: N/A  Psychiatric Specialty Exam: Physical Exam she was is full exam but is in sinus rhythm lungs are clear moves limbs normally in all direction  ROS reports no neurological issues or head trauma/noted to be hypertensive  Blood pressure (!) 144/115, pulse 92, temperature 98.2 F (36.8 C), temperature source Oral, resp. rate 20, SpO2 93 %.There is no height or weight on file to  calculate BMI.  General Appearance: Fairly Groomed  Eye Contact:  Fair  Speech:  Pressured  Volume:  Increased  Mood:  Angry and Irritable  Affect:  Restricted  Thought Process:  Irrelevant  Orientation:  Full (Time, Place, and Person)  Thought Content:  Illogical, Delusions and Tangential  Suicidal Thoughts:  No  Homicidal Thoughts:  No  Memory:  Immediate;   Poor  Judgement:  Impaired  Insight:  Lacking  Psychomotor Activity:  Normal  Concentration:  Concentration: Poor  Recall:  Poor  Fund of Knowledge:  Poor  Language:  Good  Akathisia:  Negative  Handed:  Right  AIMS (if indicated):     Assets:  Armed forces logistics/support/administrative officer Housing Leisure Time Resilience  ADL's:   Intact  Cognition:  WNL  Sleep:  Number of Hours: 2.75    Treatment Plan Summary: Daily contact with patient to assess and evaluate symptoms and progress in treatment, Medication management and Plan We will adjust antipsychotic and mood stabilizer therapy, did not sleep despite 400 mg of quetiapine but going to simply have to give her something different.  Also probably safer to be off of that agent.  Observation Level/Precautions:  15 minute checks  Laboratory:  CBC  Psychotherapy: Reality based  Medications: Prolixin added  Consultations: Not necessary  Discharge Concerns: Longer-term compliance and stability  Estimated LOS: 7-10  Other: Axis I schizoaffective bipolar type acute exacerbation involving paranoia agitation so forth Axis II defer Axis III hypertensive   Physician Treatment Plan for Primary Diagnosis: Bipolar I disorder, most recent episode manic, severe with psychotic features ([place]) Long Term Goal(s): Improvement in symptoms so as ready for discharge  Short Term Goals: Ability to identify changes in lifestyle to reduce recurrence of condition will improve  Physician Treatment Plan for Secondary Diagnosis: Principal Problem:   Bipolar I disorder, most recent episode manic, severe with psychotic features ([place]) Active Problems:   Schizoaffective disorder ([place])   Severe mixed bipolar 1 disorder without psychosis ([place])  Long Term Goal(s): Improvement in symptoms so as ready for discharge  Short Term Goals: Compliance with prescribed medications will improve  I certify that inpatient services furnished can reasonably be expected to improve the patient's condition.    Johnn Hai, MD 4/17/202010:11 AM

## 2019-03-14 NOTE — BHH Suicide Risk Assessment (Signed)
[facility] Admission Suicide Risk Assessment   Nursing information obtained from:  Patient Demographic factors:  NA Current Mental Status:  NA Loss Factors:  NA Historical Factors:  Impulsivity Risk Reduction Factors:  Living with another person, especially a relative  Total Time spent with patient: 45 minutes Principal Problem: Bipolar I disorder, most recent episode manic, severe with psychotic features ([place]) Diagnosis:  Principal Problem:   Bipolar I disorder, most recent episode manic, severe with psychotic features ([place]) Active Problems:   Schizoaffective disorder ([place])   Severe mixed bipolar 1 disorder without psychosis ([place])  Subjective Data: Quite volatile yesterday no acute dangerousness to self mainly to others at that point in time [place] today still lacking some insight  Continued Clinical Symptoms:  Alcohol Use Disorder Identification Test Final Score (AUDIT): 0 The "Alcohol Use Disorders Identification Test", Guidelines for Use in Primary Care, Second Edition.  [facility]). Score between 0-7:  no or low risk or alcohol related problems. Score between 8-15:  moderate risk of alcohol related problems. Score between 16-19:  high risk of alcohol related problems. Score 20 or above:  warrants further diagnostic evaluation for alcohol dependence and treatment.   CLINICAL FACTORS:   Bipolar Disorder:   Mixed State    COGNITIVE FEATURES THAT CONTRIBUTE TO RISK:  Closed-mindedness    SUICIDE RISK:   Minimal: No identifiable suicidal ideation.  Patients presenting with no risk factors but with morbid ruminations; may be classified as minimal risk based on the severity of the depressive symptoms  PLAN OF CARE: #1 med adjustments see orders  I certify that inpatient services furnished can reasonably be expected to improve the patient's condition.   Johnn Hai, MD 03/14/2019, 10:10 AM

## 2019-03-14 NOTE — Progress Notes (Signed)
Patient refused to participate in psychosocial assessment. CSW will follow up at a later time. CSW does not have consents to speak with family or other collateral contacts at this time.  Stephanie Acre, LCSW-A Clinical Social Worker

## 2019-03-14 NOTE — Tx Team (Signed)
Interdisciplinary Treatment and Diagnostic Plan Update  03/14/2019 Time of Session: [place] MRN: [id number]  Principal Diagnosis: Bipolar I disorder, most recent episode manic, severe with psychotic features ([place])  Secondary Diagnoses: Principal Problem:   Bipolar I disorder, most recent episode manic, severe with psychotic features ([place]) Active Problems:   Schizoaffective disorder ([place])   Severe mixed bipolar 1 disorder without psychosis ([place])   Current Medications:  Current Facility-Administered Medications  Medication Dose Route Frequency Provider Last Rate Last Dose  . amLODipine (NORVASC) tablet 10 mg  10 mg Oral Daily Laverle Hobby, PA-C   10 mg at 03/14/19 0747  . benztropine (COGENTIN) tablet 0.5 mg  0.5 mg Oral BID Johnn Hai, MD      . fluPHENAZine (PROLIXIN) tablet 5 mg  5 mg Oral TID Johnn Hai, MD      . hydrOXYzine (ATARAX/VISTARIL) tablet 25 mg  25 mg Oral TID PRN Laverle Hobby, PA-C      . lithium carbonate capsule 300 mg  300 mg Oral BID WC Patriciaann Clan E, PA-C   300 mg at 03/14/19 0747  . LORazepam (ATIVAN) tablet 1 mg  1 mg Oral TID Johnn Hai, MD      . propranolol ER (INDERAL LA) 24 hr capsule 80 mg  80 mg Oral Daily Simon, Spencer E, PA-C      . temazepam (RESTORIL) capsule 30 mg  30 mg Oral QHS Johnn Hai, MD      . vortioxetine HBr (TRINTELLIX) tablet 5 mg  5 mg Oral Daily Laverle Hobby, PA-C   5 mg at 03/14/19 0867   PTA Medications: Medications Prior to Admission  Medication Sig Dispense Refill Last Dose  . amLODipine (NORVASC) 10 MG tablet Take 1 tablet (10 mg total) by mouth daily. For high blood pressure     . lithium carbonate 300 MG capsule Take 1 capsule (300 mg total) by mouth 2 (two) times daily with a meal. For mood stabilization 60 capsule 0   . OXcarbazepine (TRILEPTAL) 150 MG tablet Take 0.5 tablets (75 mg total) by mouth 2 (two) times daily. For mood stabilization 60 tablet 0   . propranolol ER (INDERAL LA)  80 MG 24 hr capsule Take 1 capsule (80 mg total) by mouth daily. For high blood pressure/anxiety 20 capsule 0   . QUEtiapine (SEROQUEL) 400 MG tablet Take 1 tablet (400 mg total) by mouth at bedtime. For mood control (Patient taking differently: Take 200 mg by mouth at bedtime. For mood control) 30 tablet 0   . vortioxetine HBr (TRINTELLIX) 5 MG TABS tablet Take 1 tablet (5 mg total) by mouth daily. For depression 30 tablet 0     Patient Stressors: Marital or family conflict Medication change or noncompliance  Patient Strengths: Average or above average intelligence Communication skills General fund of knowledge Physical Health  Treatment Modalities: Medication Management, Group therapy, Case management,  1 to 1 session with clinician, Psychoeducation, Recreational therapy.   Physician Treatment Plan for Primary Diagnosis: Bipolar I disorder, most recent episode manic, severe with psychotic features ([place]) Long Term Goal(s): Improvement in symptoms so as ready for discharge Improvement in symptoms so as ready for discharge   Short Term Goals: Ability to identify changes in lifestyle to reduce recurrence of condition will improve Compliance with prescribed medications will improve  Medication Management: Evaluate patient's response, side effects, and tolerance of medication regimen.  Therapeutic Interventions: 1 to 1 sessions, Unit Group sessions and Medication administration.  Evaluation  of Outcomes: Not Met  Physician Treatment Plan for Secondary Diagnosis: Principal Problem:   Bipolar I disorder, most recent episode manic, severe with psychotic features ([place]) Active Problems:   Schizoaffective disorder ([place])   Severe mixed bipolar 1 disorder without psychosis ([place])  Long Term Goal(s): Improvement in symptoms so as ready for discharge Improvement in symptoms so as ready for discharge   Short Term Goals: Ability to identify changes in lifestyle to reduce recurrence of condition  will improve Compliance with prescribed medications will improve     Medication Management: Evaluate patient's response, side effects, and tolerance of medication regimen.  Therapeutic Interventions: 1 to 1 sessions, Unit Group sessions and Medication administration.  Evaluation of Outcomes: Not Met   RN Treatment Plan for Primary Diagnosis: Bipolar I disorder, most recent episode manic, severe with psychotic features ([place]) Long Term Goal(s): Knowledge of disease and therapeutic regimen to maintain health will improve  Short Term Goals: Ability to identify and develop effective coping behaviors will improve and Compliance with prescribed medications will improve  Medication Management: RN will administer medications as ordered by provider, will assess and evaluate patient's response and provide education to patient for prescribed medication. RN will report any adverse and/or side effects to prescribing provider.  Therapeutic Interventions: 1 on 1 counseling sessions, Psychoeducation, Medication administration, Evaluate responses to treatment, Monitor vital signs and CBGs as ordered, Perform/monitor CIWA, COWS, AIMS and Fall Risk screenings as ordered, Perform wound care treatments as ordered.  Evaluation of Outcomes: Not Met   LCSW Treatment Plan for Primary Diagnosis: Bipolar I disorder, most recent episode manic, severe with psychotic features ([place]) Long Term Goal(s): Safe transition to appropriate next level of care at discharge, Engage patient in therapeutic group addressing interpersonal concerns.  Short Term Goals: Engage patient in aftercare planning with referrals and resources, Increase social support and Increase skills for wellness and recovery  Therapeutic Interventions: Assess for all discharge needs, 1 to 1 time with Social worker, Explore available resources and support systems, Assess for adequacy in community support network, Educate family and significant other(s) on  suicide prevention, Complete Psychosocial Assessment, Interpersonal group therapy.  Evaluation of Outcomes: Not Met   Progress in Treatment: Attending groups: No. Participating in groups: No. Taking medication as prescribed: No. Toleration medication: No. Family/Significant other contact made: No, will contact:  when given permission Patient understands diagnosis: No. Discussing patient identified problems/goals with staff: Yes. Medical problems stabilized or resolved: Yes. Denies suicidal/homicidal ideation: Yes. Issues/concerns per patient self-inventory: No. Other: none  New problem(s) identified: No, Describe:  none  New Short Term/Long Term Goal(s):  Patient Goals:  "get my sister to do what I want her to do"  Discharge Plan or Barriers:   Reason for Continuation of Hospitalization: Delusions  Medication stabilization  Estimated Length of Stay:3-5 days.  Attendees: Patient:Paula Dickson 03/14/2019   Physician: Dr. Jake Samples, MD 03/14/2019   Nursing: Neldon Newport, RN 03/14/2019   RN Care Manager: 03/14/2019   Social Worker: Lurline Idol, LCSW 03/14/2019   Recreational Therapist:  03/14/2019   Other:  03/14/2019   Other:  03/14/2019   Other: 03/14/2019        Scribe for Treatment Team: Joanne Chars, [place] 03/14/2019 10:28 AM

## 2019-03-14 NOTE — Plan of Care (Signed)
D: Pt denies SI/HI/AV hallucinations. Pt is anxious. She is ambulating on unit interacting with staff. A: Pt was offered support and encouragement. Pt refused scheduled medications. Pt was encourage to attend groups. Q 15 minute checks were done for safety.  R:Pt. interacts well with peers and staff. Pt is not taking medication. Pt has no complaints.Pt remains safe on unit.   Problem: Health Behavior/Discharge Planning: Goal: Compliance with prescribed medication regimen will improve Outcome: Not Progressing
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
## 2019-03-14 NOTE — Plan of Care (Signed)
Progress note  Pt found in bed; semi compliant with medication. Pt has been cyclic with her behavior, moving from rage to euphoria. Pt is argumentative, crossing boundaries, and yelling/cursing at staff. Pt denies si/hi/ah/vh and verbally agrees to approach staff if these become apparent if these become apparent or before harming herself/others while at [facility]. Pt safe on the unit. Will continue to monitor.  Pt provided medication/education. Q77m safety checks implemented and continued.   Problem: Education: Goal: Ability to verbalize precipitating factors for violent behavior will improve Outcome: Not Progressing   Problem: Coping: Goal: Ability to verbalize frustrations and anger appropriately will improve Outcome: Not Progressing   Problem: Health Behavior/Discharge Planning: Goal: Ability to implement measures to prevent violent behavior in the future will improve Outcome: Not Progressing   Problem: Safety: Goal: Ability to demonstrate self-control will improve Outcome: Not Progressing

## 2019-03-15 DIAGNOSIS — F312 Bipolar disorder, current episode manic severe with psychotic features: Principal | ICD-10-CM

## 2019-03-15 LAB — PROLACTIN: Prolactin: 11 ng/mL (ref 4.8–23.3)

## 2019-03-15 MED ORDER — METOPROLOL TARTRATE 12.5 MG HALF TABLET
12.5000 mg | ORAL_TABLET | Freq: Every day | ORAL | Status: DC
  Filled 2019-03-15 (×5): qty 1

## 2019-03-15 MED ORDER — PROPRANOLOL HCL ER 80 MG PO CP24
80.0000 mg | ORAL_CAPSULE | Freq: Every day | ORAL | Status: DC
  Filled 2019-03-15: qty 1

## 2019-03-15 NOTE — Progress Notes (Signed)
Patient ID: Paula Dickson, female   DOB: 1966/10/21, 53 y.o.   MRN: [id number]  [facility] NOVEL CORONAVIRUS (COVID-19) DAILY CHECK-OFF SYMPTOMS - answer yes or no to each - every day NO YES  Have you had a fever in the past 24 hours?  . Fever (Temp > 37.80C / 100F) X   Have you had any of these symptoms in the past 24 hours? . New Cough .  Sore Throat  .  Shortness of Breath .  Difficulty Breathing .  Unexplained Body Aches   X   Have you had any one of these symptoms in the past 24 hours not related to allergies?   . Runny Nose .  Nasal Congestion .  Sneezing   X   If you have had runny nose, nasal congestion, sneezing in the past 24 hours, has it worsened?  X   EXPOSURES - check yes or no X   Have you traveled outside the state in the past 14 days?  X   Have you been in contact with someone with a confirmed diagnosis of COVID-19 or PUI in the past 14 days without wearing appropriate PPE?  X   Have you been living in the same home as a person with confirmed diagnosis of COVID-19 or a PUI (household contact)?    X   Have you been diagnosed with COVID-19?    X              What to do next: Answered NO to all: Answered YES to anything:   Proceed with unit schedule Follow the [facility] Inpatient Flowsheet.
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
## 2019-03-15 NOTE — Progress Notes (Signed)
D:  Paula Dickson was up and visible on the unit.  She was noted on the phone multiple times talking loudly and difficult to redirect.  She denied SI/HI or A/V hallucinations.  When RN attempted to ask further questions, she walked away.  She came up demanding food and was told that we will be giving snack out at 830pm,  At 830pm snack time, she declined stating "I'm fasting" but did drink multiple cups of water.  She refused HS medications.  She is currently sitting quietly in her room. A:  1:1 with RN for support and encouragement.  Medications as ordered.  Q 15 minute checks maintained for safety.  Encouraged participation in group and unit activities.   R:  [name] remains safe on the unit.  We will continue to monitor the progress towards her goals.
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
## 2019-03-16 MED ORDER — LORAZEPAM 1 MG PO TABS
1.0000 mg | ORAL_TABLET | Freq: Four times a day (QID) | ORAL | Status: DC | PRN
  Filled 2019-03-16: qty 1

## 2019-03-16 MED ORDER — HALOPERIDOL 5 MG PO TABS
5.0000 mg | ORAL_TABLET | Freq: Three times a day (TID) | ORAL | Status: DC | PRN
  Filled 2019-03-16: qty 1

## 2019-03-16 MED ORDER — LORAZEPAM 2 MG/ML IJ SOLN
1.0000 mg | Freq: Four times a day (QID) | INTRAMUSCULAR | Status: DC | PRN
  Administered 2019-03-18 – 2019-03-20 (×2): 1 mg via INTRAMUSCULAR
  Filled 2019-03-16 (×2): qty 1

## 2019-03-16 MED ORDER — HALOPERIDOL LACTATE 5 MG/ML IJ SOLN
5.0000 mg | Freq: Three times a day (TID) | INTRAMUSCULAR | Status: DC | PRN
  Administered 2019-03-18 – 2019-03-20 (×2): 5 mg via INTRAMUSCULAR
  Filled 2019-03-16 (×2): qty 1

## 2019-03-16 NOTE — Progress Notes (Signed)
D: Patient observed up and restless on unit. Patient remains labile and is easily agitated. Paranoid and argumentative at times. Perceives innocent statements by staff are meant insultingly. Patient's affect animated, angry with congruent mood. Denies pain, physical complaints. COVID-19 screen negative, afebrile. Respiratory assessment WDL.  A: Medications offered with medication education provided. Level III obs in place for safety. Emotional support attempted. Encouraged to attend and participate in unit programming.   R: Patient refusing all medications. "I can heal the natural way." Patient verbalizes some understanding of POC. Patient denies SI/HI/AVH and remains safe on level III obs. Will continue to monitor throughout the night.

## 2019-03-16 NOTE — Progress Notes (Signed)
Patient ID: Paula Dickson, female   DOB: 16-Sep-1966, 53 y.o.   MRN: [id number]  [place] NOVEL CORONAVIRUS (COVID-19) DAILY CHECK-OFF SYMPTOMS - answer yes or no to each - every day NO YES  Have you had a fever in the past 24 hours?  . Fever (Temp > 37.80C / 100F) X   Have you had any of these symptoms in the past 24 hours? . New Cough .  Sore Throat  .  Shortness of Breath .  Difficulty Breathing .  Unexplained Body Aches   X   Have you had any one of these symptoms in the past 24 hours not related to allergies?   . Runny Nose .  Nasal Congestion .  Sneezing   X   If you have had runny nose, nasal congestion, sneezing in the past 24 hours, has it worsened?  X   EXPOSURES - check yes or no X   Have you traveled outside the state in the past 14 days?  X   Have you been in contact with someone with a confirmed diagnosis of COVID-19 or PUI in the past 14 days without wearing appropriate PPE?  X   Have you been living in the same home as a person with confirmed diagnosis of COVID-19 or a PUI (household contact)?    X   Have you been diagnosed with COVID-19?    X              What to do next: Answered NO to all: Answered YES to anything:   Proceed with unit schedule Follow the [facility] Inpatient Flowsheet.

## 2019-03-16 NOTE — [facility] Group Notes (Signed)
[place] LCSW Group Therapy Note  Date/Time:  03/16/2019  10:00am-11:00am  Type of Therapy and Topic:  Group Therapy:  Music and Mood  Participation Level:  Did Not Attend   Description of Group: In this process group, members listened to a variety of genres of music and identified that different types of music evoke different responses.  Patients were encouraged to identify music that was soothing for them and music that was energizing for them.  Patients discussed how this knowledge can help with wellness and recovery in various ways including managing depression and anxiety as well as encouraging healthy sleep habits.    Therapeutic Goals: 1. Patients will explore the impact of different varieties of music on mood 2. Patients will verbalize the thoughts they have when listening to different types of music 3. Patients will identify music that is soothing to them as well as music that is energizing to them 4. Patients will discuss how to use this knowledge to assist in maintaining wellness and recovery 5. Patients will explore the use of music as a coping skill  Summary of Patient Progress:  Did not attend - N/A  Therapeutic Modalities: Solution Focused Brief Therapy Activity   Selmer Dominion, LCSW

## 2019-03-16 NOTE — Plan of Care (Signed)
  Problem: Coping: Goal: Ability to verbalize frustrations and anger appropriately will improve Outcome: Not Progressing   D: Pt alert and oriented on the unit. Pt denies SI/HI, A/VH. Pt's was irritable during the day and talked on the phone. Pt was minimal with interactions with staff, refusing all of her medications saying "All I need is God."  A: Education, support and encouragement provided, q15 minute safety checks remain in effect. R: Pt denies any concerns at this time, and verbally contracts for safety. Pt ambulating on the unit with no issues. Pt remains safe on and off the unit.

## 2019-03-16 NOTE — Progress Notes (Addendum)
[facility] MD Progress Note  03/16/2019 9:33 AM Paula Dickson  MRN:  [id number]    Evaluation:  [name] observed standing at the nurses station.  Reports " I really do not want to  talk to you today, y'all are not listening to me."  Patient became tearful throughout assessment and is topic jumping.  she states she does not need medications as she is feeling fine.  Chart reviewed 3 hours of sleep on last night.  Labile mood, tangential and delusional.  Expressed concern for father's health.  continues to deny suicidal or homicidal ideations.  Denies auditory or visual hallucinations.  [name] speech is pressured she is hyperverbal.  Presents with paranoia ideation. "  I know you are watching  all night" [name] needs encouragement with medication management as she has been refusing medications throughout the day. 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 Bipolar affective disorder ([place])   . History of arthritis   . History of chicken pox   . History of depression   . History of genital warts   . history of heart murmur   . History of high blood pressure   . History of thyroid disease   . History of UTI   . Hypertension   . Low TSH level 07/13/2017  . Schizophrenia [facility])     Past Surgical History:  Procedure Laterality Date  . ABLATION ON ENDOMETRIOSIS    . CYST REMOVAL NECK     around 11 years ago /benign  . MULTIPLE TOOTH EXTRACTIONS     Family History:  Family History  Problem Relation Age of Onset  . Arthritis  Father   . Hyperlipidemia Father   . High blood pressure Father   . Diabetes Sister   . Diabetes Mother   . Diabetes Brother   . Mental illness Brother   . Alcohol abuse Paternal Uncle   . Alcohol abuse Paternal Grandfather   . Breast cancer Maternal Aunt   . Breast cancer Paternal Aunt   . High blood pressure Sister   . Mental illness Other        runs in family   Family Psychiatric  History:  Social History:  Social History   Substance and Sexual Activity  Alcohol Use No     Social History   Substance and Sexual Activity  Drug Use No    Social History   Socioeconomic History  . Marital status: Single    Spouse name: Not on file  . Number of children: Not on file  . Years of education: Not on file  . Highest education level: Not on file  Occupational History  . Not on file  Social Needs  . Financial resource strain: Not on file  . Food insecurity:    Worry: Not on file    Inability: Not on file  . Transportation needs:    Medical: Not on file    Non-medical: Not on file  Tobacco Use  . Smoking status: Never Smoker  . Smokeless tobacco: Never Used  Substance and Sexual Activity  .  Alcohol use: No  . Drug use: No  . Sexual activity: Not Currently  Lifestyle  . Physical activity:    Days per week: Not on file    Minutes per session: Not on file  . Stress: Not on file  Relationships  . Social connections:    Talks on phone: Not on file    Gets together: Not on file    Attends religious service: Not on file    Active member of club or organization: Not on file    Attends meetings of clubs or organizations: Not on file    Relationship status: Not on file  Other Topics Concern  . Not on file  Social History Narrative  . Not on file   Additional Social History:    Pain Medications: See PTA medication list Prescriptions: See PTA medication list Over the Counter: See PTA medication list History of alcohol / drug use?: No history of alcohol / drug  abuse(Pt denies.)                    Sleep: Fair  Appetite:  Fair  Current Medications: Current Facility-Administered Medications  Medication Dose Route Frequency Provider Last Rate Last Dose  . amLODipine (NORVASC) tablet 10 mg  10 mg Oral Daily Laverle Hobby, PA-C   10 mg at 03/15/19 4259  . benztropine (COGENTIN) tablet 0.5 mg  0.5 mg Oral BID Johnn Hai, MD      . clonazePAM Bobbye [place]) tablet 2 mg  2 mg Oral Once Johnn Hai, MD      . fluPHENAZine (PROLIXIN) tablet 5 mg  5 mg Oral TID Johnn Hai, MD      . haloperidol (HALDOL) tablet 5 mg  5 mg Oral Q8H PRN Derrill Center, NP       Or  . haloperidol lactate (HALDOL) injection 5 mg  5 mg Intramuscular Q8H PRN Derrill Center, NP      . hydrOXYzine (ATARAX/VISTARIL) tablet 25 mg  25 mg Oral TID PRN Laverle Hobby, PA-C      . lithium carbonate capsule 300 mg  300 mg Oral BID WC Patriciaann Clan E, PA-C   300 mg at 03/15/19 0811  . LORazepam (ATIVAN) tablet 1 mg  1 mg Oral Q6H PRN Derrill Center, NP       Or  . LORazepam (ATIVAN) injection 1 mg  1 mg Intramuscular Q6H PRN Derrill Center, NP      . LORazepam (ATIVAN) injection 4 mg  4 mg Intramuscular Once Johnn Hai, MD      . metoprolol tartrate (LOPRESSOR) tablet 12.5 mg  12.5 mg Oral Daily Derrill Center, NP      . temazepam (RESTORIL) capsule 30 mg  30 mg Oral QHS Johnn Hai, MD      . vortioxetine HBr (TRINTELLIX) tablet 5 mg  5 mg Oral Daily Laverle Hobby, PA-C   5 mg at 03/14/19 5638    Lab Results:  No results found for this or any previous visit (from the past 48 hour(s)).  Blood Alcohol level:  Lab Results  Component Value Date   [facility] <10 10/31/2018   ETH <5 75/64/3329    Metabolic Disorder Labs: Lab Results  Component Value Date   HGBA1C 5.6 03/14/2019   MPG 114.02 03/14/2019   MPG 108.28 11/03/2018   Lab Results  Component Value Date   PROLACTIN 11.0 03/14/2019   Lab Results  Component Value Date   CHOL 152  03/14/2019   TRIG  120 03/14/2019   HDL 50 03/14/2019   CHOLHDL 3.0 03/14/2019   VLDL 24 03/14/2019   LDLCALC 78 03/14/2019   LDLCALC 76 11/03/2018    Physical Findings: AIMS: Facial and Oral Movements Muscles of Facial Expression: None, normal Lips and Perioral Area: None, normal Jaw: None, normal Tongue: None, normal,Extremity Movements Upper (arms, wrists, hands, fingers): None, normal Lower (legs, knees, ankles, toes): None, normal, Trunk Movements Neck, shoulders, hips: None, normal, Overall Severity Severity of abnormal movements (highest score from questions above): None, normal Incapacitation due to abnormal movements: None, normal Patient's awareness of abnormal movements (rate only patient's report): No Awareness, Dental Status Current problems with teeth and/or dentures?: No Does patient usually wear dentures?: No  CIWA:  CIWA-Ar Total: 0 COWS:  COWS Total Score: 0  Musculoskeletal: Strength & Muscle Tone: within normal limits Gait & Station: normal Patient leans: N/A  Psychiatric Specialty Exam: Physical Exam  Constitutional: She is oriented to person, place, and time.  Neurological: She is alert and oriented to person, place, and time.  Psychiatric: She has a normal mood and affect.    Review of Systems  Psychiatric/Behavioral: The patient is nervous/anxious.     Blood pressure (!) 153/92, pulse 76, temperature 98.2 F (36.8 C), resp. rate 16, SpO2 93 %.There is no height or weight on file to calculate BMI.  General Appearance: Casual  Eye Contact:  Fair  Speech:  Clear and Coherent and Pressured  Volume:  Normal  Mood:  Anxious  Affect:  Congruent and Labile  Thought Process:  Coherent  Orientation:  Full (Time, Place, and Person)  Thought Content:  Logical  Suicidal Thoughts:  No  Homicidal Thoughts:  No  Memory:  Immediate;   Fair Recent;   Fair  Judgement:  Fair  Insight:  Fair  Psychomotor Activity:  Normal  Concentration:  Concentration: Fair  Recall:  [employer] of Knowledge:  Fair  Language:  Fair  Akathisia:  No  Handed:  Right  AIMS (if indicated):     Assets:  Communication Skills Desire for Improvement Resilience Social Support  ADL's:  Intact  Cognition:  WNL  Sleep:  Number of Hours: 2.5     Treatment Plan Summary: Daily contact with patient to assess and evaluate symptoms and progress in treatment and Medication management   Continue with current treatment plan on 03/16/2019 as listed below except were noted  Bipolar disorder :  Continue Prolixin 5 mg p.o. 3 times daily Continue lithium 300 mg p.o. twice daily - li level 0.43 Continue Trintellix 5 mg p.o. daily Continue Cogentin 0.5 mg p.o. twice daily  HTN:  Continue Norvasc 10 mg p.o. daily Discontinued Inderal 120 mg po daily to 80 mg  Creatinine levl 1.24  - Spoke to MD Manuella Ghazi 11:50 wil discontinue Inderal 120mg  and Initiate Lopressor 12.5mg  daily ( titration) -repeat CMP    Insomnia: Continue Restoril 30 mg p.o. nightly  NP placed orders for internal med consult CSW to continue working on discharge disposition Patient encouraged to participate throughout the milieu    Derrill Center, NP 03/16/2019, 9:33 AM Attest to NP Progress Note

## 2019-03-16 NOTE — Progress Notes (Signed)
Paula Dickson has been up most of the night.  She has been up to the nurses desk multiple times talking loudly and demanding water.  She has been difficult to redirect to her room.  She did get up this morning for vital signs.  Discussed with her about letting staff complete EKG as ordered and she stated "not right now, I have taken a lot of medications and I have the right not to take that."

## 2019-03-16 NOTE — Progress Notes (Signed)
Patient ID: Paula Dickson, female   DOB: [birth year], 53 y.o.   MRN: [id number]   Pt refuses vital signs and refuses all medications. Pt stated, "I have a right!"

## 2019-03-17 MED ORDER — FLUPHENAZINE HCL 10 MG PO TABS
10.0000 mg | ORAL_TABLET | Freq: Every day | ORAL | Status: DC
  Filled 2019-03-17: qty 1

## 2019-03-17 NOTE — Progress Notes (Addendum)
Patient remains awake at this time. Angry when 15 min safety checks are performed. Adamantly refuses to take medications, scheduled or otherwise, and asks to be left alone. Unable to reason with patient, and patient unwilling to weigh options offered by staff. Patient is staying in her room at this time. Patient has not slept more than 3 hours/night since her arrival (the night of 03/13/19). The last successful admin of medications was in the AM on 03/15/19 however pt only took norvasc, propanolol and lithium. Other medications were refused.

## 2019-03-17 NOTE — Progress Notes (Signed)
[facility] MD Progress Note  03/17/2019 11:14 AM Paula Dickson  MRN:  [id number] Subjective:    Reports indicate continued irritability and refusal of medications, sleeping less than 3 hours, and again partially compliant did take her lithium we will check levels when she is fully compliant Patient is giving me silly answers so she is not as irritable but is giddy and nonserious in the form of her mania today blood pressure up prior to meds.  No EPS or TD  Principal Problem: Bipolar I disorder, most recent episode manic, severe with psychotic features ([place]) Diagnosis: Principal Problem:   Bipolar I disorder, most recent episode manic, severe with psychotic features ([place]) Active Problems:   Schizoaffective disorder ([place])   Severe mixed bipolar 1 disorder without psychosis ([place])  Total Time spent with patient: 20 minutes  Past Medical History:  Past Medical History:  Diagnosis Date  . Bipolar affective disorder ([place])   . History of arthritis   . History of chicken pox   . History of depression   . History of genital warts   . history of heart murmur   . History of high blood pressure   . History of thyroid disease   . History of UTI   . Hypertension   . Low TSH level 07/13/2017  . Schizophrenia [facility])     Past Surgical History:  Procedure Laterality Date  . ABLATION ON ENDOMETRIOSIS    . CYST REMOVAL NECK     around 11 years ago /benign  . MULTIPLE TOOTH EXTRACTIONS     Family History:  Family History  Problem Relation Age of Onset  . Arthritis Father   . Hyperlipidemia Father   . High blood pressure Father   . Diabetes Sister   . Diabetes Mother   . Diabetes Brother   . Mental illness Brother   . Alcohol abuse Paternal Uncle   . Alcohol abuse Paternal Grandfather   . Breast cancer Maternal Aunt   . Breast cancer Paternal Aunt   . High blood pressure Sister   . Mental illness Other        runs in family   Family Psychiatric  History: ukn  Social History:   Social History   Substance and Sexual Activity  Alcohol Use No     Social History   Substance and Sexual Activity  Drug Use No    Social History   Socioeconomic History  . Marital status: Single    Spouse name: Not on file  . Number of children: Not on file  . Years of education: Not on file  . Highest education level: Not on file  Occupational History  . Not on file  Social Needs  . Financial resource strain: Not on file  . Food insecurity:    Worry: Not on file    Inability: Not on file  . Transportation needs:    Medical: Not on file    Non-medical: Not on file  Tobacco Use  . Smoking status: Never Smoker  . Smokeless tobacco: Never Used  Substance and Sexual Activity  . Alcohol use: No  . Drug use: No  . Sexual activity: Not Currently  Lifestyle  . Physical activity:    Days per week: Not on file    Minutes per session: Not on file  . Stress: Not on file  Relationships  . Social connections:    Talks on phone: Not on file    Gets together: Not on file  Attends religious service: Not on file    Active member of club or organization: Not on file    Attends meetings of clubs or organizations: Not on file    Relationship status: Not on file  Other Topics Concern  . Not on file  Social History Narrative  . Not on file   Additional Social History:    Pain Medications: See PTA medication list Prescriptions: See PTA medication list Over the Counter: See PTA medication list History of alcohol / drug use?: No history of alcohol / drug abuse(Pt denies.)                    Sleep: Poor  Appetite:  Good  Current Medications: Current Facility-Administered Medications  Medication Dose Route Frequency Provider Last Rate Last Dose  . amLODipine (NORVASC) tablet 10 mg  10 mg Oral Daily Laverle Hobby, PA-C   10 mg at 03/15/19 0254  . benztropine (COGENTIN) tablet 0.5 mg  0.5 mg Oral BID Johnn Hai, MD      . clonazePAM Bobbye [place]) tablet 2 mg  2  mg Oral Once Johnn Hai, MD      . fluPHENAZine (PROLIXIN) tablet 5 mg  5 mg Oral TID Johnn Hai, MD      . haloperidol (HALDOL) tablet 5 mg  5 mg Oral Q8H PRN Derrill Center, NP       Or  . haloperidol lactate (HALDOL) injection 5 mg  5 mg Intramuscular Q8H PRN Derrill Center, NP      . hydrOXYzine (ATARAX/VISTARIL) tablet 25 mg  25 mg Oral TID PRN Laverle Hobby, PA-C      . lithium carbonate capsule 300 mg  300 mg Oral BID WC Patriciaann Clan E, PA-C   300 mg at 03/15/19 0811  . LORazepam (ATIVAN) tablet 1 mg  1 mg Oral Q6H PRN Derrill Center, NP       Or  . LORazepam (ATIVAN) injection 1 mg  1 mg Intramuscular Q6H PRN Derrill Center, NP      . LORazepam (ATIVAN) injection 4 mg  4 mg Intramuscular Once Johnn Hai, MD      . metoprolol tartrate (LOPRESSOR) tablet 12.5 mg  12.5 mg Oral Daily Derrill Center, NP      . temazepam (RESTORIL) capsule 30 mg  30 mg Oral QHS Johnn Hai, MD      . vortioxetine HBr (TRINTELLIX) tablet 5 mg  5 mg Oral Daily Laverle Hobby, PA-C   5 mg at 03/14/19 2706    Lab Results: No results found for this or any previous visit (from the past 48 hour(s)).  Blood Alcohol level:  Lab Results  Component Value Date   ETH <10 10/31/2018   ETH <5 23/76/2831    Metabolic Disorder Labs: Lab Results  Component Value Date   HGBA1C 5.6 03/14/2019   MPG 114.02 03/14/2019   MPG 108.28 11/03/2018   Lab Results  Component Value Date   PROLACTIN 11.0 03/14/2019   Lab Results  Component Value Date   CHOL 152 03/14/2019   TRIG 120 03/14/2019   HDL 50 03/14/2019   CHOLHDL 3.0 03/14/2019   VLDL 24 03/14/2019   LDLCALC 78 03/14/2019   LDLCALC 76 11/03/2018    Physical Findings: AIMS: Facial and Oral Movements Muscles of Facial Expression: None, normal Lips and Perioral Area: None, normal Jaw: None, normal Tongue: None, normal,Extremity Movements Upper (arms, wrists, hands, fingers): None, normal Lower (legs, knees, ankles,  toes): None,  normal, Trunk Movements Neck, shoulders, hips: None, normal, Overall Severity Severity of abnormal movements (highest score from questions above): None, normal Incapacitation due to abnormal movements: None, normal Patient's awareness of abnormal movements (rate only patient's report): No Awareness, Dental Status Current problems with teeth and/or dentures?: No Does patient usually wear dentures?: No  CIWA:  CIWA-Ar Total: 0 COWS:  COWS Total Score: 0  Musculoskeletal: Strength & Muscle Tone: within normal limits Gait & Station: normal Patient leans: N/A  Psychiatric Specialty Exam: Physical Exam  ROS  Blood pressure (!) 143/116, pulse 87, temperature 98.7 F (37.1 C), temperature source Oral, resp. rate 16, SpO2 93 %.There is no height or weight on file to calculate BMI.  General Appearance: Guarded  Eye Contact:  Fair  Speech:  Pressured  Volume:  Increased  Mood:  Irritable  Affect:  Congruent  Thought Process:  Irrelevant  Orientation:  Full (Time, Place, and Person)  Thought Content:  Illogical and Delusions  Suicidal Thoughts:  No  Homicidal Thoughts:  No  Memory:  Immediate;   Poor  Judgement:  Impaired  Insight:  Lacking  Psychomotor Activity:  Normal  Concentration:  Attention Span: Poor  Recall:  Poor  Fund of Knowledge:  Poor  Language:  Poor  Akathisia:  NA  Handed:  Right  AIMS (if indicated):     Assets:  Resilience Social Support  ADL's:  Intact  Cognition:  WNL  Sleep:  Number of Hours: 0     Treatment Plan Summary: Daily contact with patient to assess and evaluate symptoms and progress in treatment and Medication management change Prolixin to 10 mg at bedtime to help with sleep and reduce the chances of med refusal just with one-time dosing of a higher dose.  Add sleep aid continue to monitor continue cognitive and reality-based therapy Carrera Kiesel, MD 03/17/2019, 11:14 AM

## 2019-03-17 NOTE — [facility] Group Notes (Signed)
[place] LCSW Group Therapy Note  Date/Time: 03/17/19, 1315  Type of Therapy and Topic:  Group Therapy:  Overcoming Obstacles  Participation Level: active   Description of Group:    In this group patients will be encouraged to explore what they see as obstacles to their own wellness and recovery. They will be guided to discuss their thoughts, feelings, and behaviors related to these obstacles. The group will process together ways to cope with barriers, with attention given to specific choices patients can make. Each patient will be challenged to identify changes they are motivated to make in order to overcome their obstacles. This group will be process-oriented, with patients participating in exploration of their own experiences as well as giving and receiving support and challenge from other group members.  Therapeutic Goals: 1. Patient will identify personal and current obstacles as they relate to admission. 2. Patient will identify barriers that currently interfere with their wellness or overcoming obstacles.  3. Patient will identify feelings, thought process and behaviors related to these barriers. 4. Patient will identify two changes they are willing to make to overcome these obstacles:    Summary of Patient Progress: Pt still sharing that her family is her obstacle as they are the reason she is at [facility].  Pt was attentive and active during group and several times spoke for several minutes, often off topic.  Pt was pleasant today and somewhat appropriate.        Therapeutic Modalities:   Cognitive Behavioral Therapy Solution Focused Therapy Motivational Interviewing Relapse Prevention Therapy  Lurline Idol, LCSW

## 2019-03-17 NOTE — [facility] Suicide Risk Assessment (Signed)
[place] INPATIENT:  Family/Significant Other Suicide Prevention Education  Suicide Prevention Education:  Patient Refusal for Family/Significant Other Suicide Prevention Education: The patient Paula Dickson has refused to provide written consent for family/significant other to be provided Family/Significant Other Suicide Prevention Education during admission and/or prior to discharge.  Physician notified.  Joanne Chars, LCSW 03/17/2019, 3:04 PM

## 2019-03-17 NOTE — Plan of Care (Signed)
Progress note  Pt found on the phone; non-compliant with medication administration. "I don't take all that and I'm not taking nothing!" pt has been calmer in the milieu as compared to last week. Pt denies any pain or other symptoms. Pt rates her depression/hopelessness/anxiety a 0/0/0 out of 10 respectively. Pt states her goal for today is to "bring unity in my family and to have my father get the best care that he deserves". Pt will achieve this by doing her best to stay focused. Pt denies si/hi/ah/vh and verbally agrees to approach staff if these become apparent or before harming herself/others while at [facility]. A: pt provided support and encouragement. Pt given education on her medications. Q21m safety checks implemented and continued.  R: pt safe on the unit. Will continue to monitor.   Pt progressing in the following metrics  Problem: Safety: Goal: Ability to redirect hostility and anger into socially appropriate behaviors will improve Outcome: Progressing   Problem: Education: Goal: Emotional status will improve Outcome: Progressing Goal: Verbalization of understanding the information provided will improve Outcome: Progressing   Problem: Coping: Goal: Ability to verbalize frustrations and anger appropriately will improve Outcome: Progressing

## 2019-03-17 NOTE — Progress Notes (Signed)
Recreation Therapy Notes  04.20.20 1044:  LRT met with patient to complete recreation therapy assessment.  Pt was agitated.  Pt stated she wasn't talking if it wasn't going to help her.  Pt also stated she was here because people keep lying on her about not taking her medications.  LRT will follow up with patient tomorrow.    [name], LRT/CTRS    [name] A 03/17/2019 11:48 AM

## 2019-03-17 NOTE — Progress Notes (Signed)
Recreation Therapy Notes  Date: 4.20.20 Time: 1000 Location: 500 Hall Dayroom  Group Topic: Wellness  Goal Area(s) Addresses:  Patient will define components of whole wellness. Patient will verbalize benefit of whole wellness.  Intervention:  Music   Activity:  Exercise.  LRT allowed each patient to lead the group in stretches.  Once stretches were completed, each patient led the group in an exercise of their choice.  Patients were to complete at least 30 minutes of exercise.  Patients were allowed to take breaks and get water as needed.  Education: Wellness, Dentist.   Education Outcome: Acknowledges education/In group clarification offered/Needs additional education.   Clinical Observations/Feedback:  Pt did not attend group.     [name], LRT/CTRS         [name] A 03/17/2019 11:47 AM

## 2019-03-18 MED ORDER — METOPROLOL TARTRATE 25 MG PO TABS
25.0000 mg | ORAL_TABLET | Freq: Every day | ORAL | Status: DC
  Filled 2019-03-18 (×4): qty 1

## 2019-03-18 MED ORDER — FLUPHENAZINE HCL 5 MG PO TABS
15.0000 mg | ORAL_TABLET | Freq: Every day | ORAL | Status: DC
  Filled 2019-03-18 (×3): qty 3

## 2019-03-18 NOTE — Progress Notes (Signed)
Recreation Therapy Notes  INPATIENT RECREATION THERAPY ASSESSMENT  Patient Details Name: Paula Dickson MRN: [id number] DOB: Oct 06, 1966 Today's Date: 03/18/2019       Information Obtained From: Chart Review  Able to Participate in Assessment/Interview:    Patient Presentation:    Reason for Admission (Per Patient): Med Non-Compliance, Other (Comments)(Destroying property)  Patient Stressors: Family, Other (Comment)(Father has terminal illness; siblings coming in and out the house; believes she doesn't need medication)  Coping Skills:   (None identified)  Leisure Interests (2+):  Individual - Writing, Individual - Reading, Individual - Other (Comment)(Taking classes)  Frequency of Recreation/Participation: (Not identified)  Awareness of Community Resources:  (Not identified)  Community Resources:     Current Use:    If no, Barriers?:    Expressed Interest in [place]: (Not identified)  [place] of Residence:  [place]  Patient Main Form of Transportation: (Not identified)  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

## 2019-03-18 NOTE — Progress Notes (Signed)
Paula Dickson has been up and down all night.  When she does come out, demanding various things, snacks, cola and "I need a watch." When she was informed that someone can bring her one she stated angrily "are you going to get me one?"   She is difficult to redirect to her room and will glare at staff when she is not given what she wants.  She came out clearing her throat loudly that woke up others on the unit.  She was encouraged to lay quietly in the room.  She was given a choice to take PO prn for agitation which she declined.  She went to her room but promptly came back out stating "you can't touch this."  She then came up to RN and got in her face and threatened to "kick you in the head."  Show of support called and she agreed to take IM lorazepam and haldol.  We will continue to monitor the progress towards her goals.

## 2019-03-18 NOTE — Plan of Care (Signed)
D: Patient presents argumentative, agitated. She refused all of her medications, except amlodipine and lithium. Patient denies SI/AVH. She did endorse HI, but would not elaborate. She refused to fill out a self-inventory. She is hyper-religious and stating that God told her she didn't need medications.  A: Patient checked q15 min, and checks reviewed. Reviewed medication changes with patient and educated on side effects. Educated patient on importance of attending group therapy sessions and educated on several coping skills. Encouarged participation in milieu through recreation therapy and attending meals with peers. Support and encouragement provided. Fluids offered. R: Patient is not receptive to education on medications, and is non-compliant with meds. Patient contracts for safety on the unit.  Problem: Education: Goal: Emotional status will improve Outcome: Not Progressing Goal: Verbalization of understanding the information provided will improve Outcome: Not Progressing   Problem: Coping: Goal: Ability to verbalize frustrations and anger appropriately will improve Outcome: Not Progressing Goal: Ability to demonstrate self-control will improve Outcome: Not Progressing

## 2019-03-18 NOTE — Progress Notes (Signed)
Recreation Therapy Notes  Date: 4.21.20 Time: 1000 Location: 500 Hall Dayroom  Group Topic: Self-Esteem  Goal Area(s) Addresses:  Patient will successfully identify positive attributes about themselves.  Patient will successfully identify benefit of improved self-esteem.   Intervention: Worksheet, colored pencils, markers  Activity: Mask Art.  Each patient was given an outline of a female or female face.  Patients were to fill in the face with a creation of how they see themselves.  Patients could also write words, a poem or draw other images around the face that describes them.  Education:  Self-Esteem, Dentist.   Education Outcome: Acknowledges education/In group clarification offered/Needs additional education  Clinical Observations/Feedback: Pt did not attend group.    [name], LRT/CTRS         [name] A 03/18/2019 11:28 AM

## 2019-03-18 NOTE — Progress Notes (Signed)
   03/18/19 0828  COVID-19 Daily Checkoff  Have you had a fever (temp > 37.80C/100F)  in the past 24 hours?  No  If you have had runny nose, nasal congestion, sneezing in the past 24 hours, has it worsened? No  COVID-19 EXPOSURE  Have you traveled outside the state in the past 14 days? No  Have you been in contact with someone with a confirmed diagnosis of COVID-19 or PUI in the past 14 days without wearing appropriate PPE? No  Have you been living in the same home as a person with confirmed diagnosis of COVID-19 or a PUI (household contact)? No  Have you been diagnosed with COVID-19? No

## 2019-03-18 NOTE — [facility] Group Notes (Addendum)
[place] LCSW Group Therapy Note  Date/Time: 03/18/19, 1100  Type of Therapy/Topic:  Group Therapy:  Feelings about Diagnosis  Participation Level:  Active   Mood:pleasant   Description of Group:    This group will allow patients to explore their thoughts and feelings about diagnoses they have received. Patients will be guided to explore their level of understanding and acceptance of these diagnoses. Facilitator will encourage patients to process their thoughts and feelings about the reactions of others to their diagnosis, and will guide patients in identifying ways to discuss their diagnosis with significant others in their lives. This group will be process-oriented, with patients participating in exploration of their own experiences as well as giving and receiving support and challenge from other group members.   Therapeutic Goals: 1. Patient will demonstrate understanding of diagnosis as evidence by identifying two or more symptoms of the disorder:  2. Patient will be able to express two feelings regarding the diagnosis 3. Patient will demonstrate ability to communicate their needs through discussion and/or role plays  Summary of Patient Progress:Pt appropriate in group today, not as talkative at first but when CSW asked her a question she started speaking for some time and turned the topic back towards her sister having mistreated her by sending her to [facility].  Pt not able to identify her diagnosis and wanted to talk about being misdiagnosed, being forced to wait for three hours at [facility] and similar complaints.         Therapeutic Modalities:   Cognitive Behavioral Therapy Brief Therapy Feelings Identification   Lurline Idol, LCSW

## 2019-03-18 NOTE — Progress Notes (Addendum)
D:  Paula Dickson was up and pacing the hallway.  Demanding water continually, wanting staff to look up peoples phone numbers on the Internet and intrusively asking personal questions of staff members.  She would not answer any questions asked from staff and would just walk away.  She adamantly denied needing medications stating "I will take what I think works best" although she will not explain what medications she is speaking of.  She continues to declined her blood pressure medication and evening blood pressure was 138/108 sitting and 166/103 standing.  Notified Paula Romp NP about blood pressure.  No changes noted since she is refusing medications, but MD about the plan going further. She was later noted standing in the hallway encouraging peers to not take medication was redirected to her room.  She would come out occasionally to glare at staff and make random statements.  She did finally lay down and appears to be asleep. A:  1:1 with RN for support and encouragement.  Encouraged medications as ordered.  Q 15 minute checks maintained for safety.  Encouraged participation in group and unit activities.   R:  Paula Dickson remains safe on the unit.  We will continue to monitor the progress towards her goals.

## 2019-03-18 NOTE — Progress Notes (Signed)
[facility] MD Progress Note  03/18/2019 9:44 AM Paula Dickson  MRN:  [id number] Subjective:    Patient remains irritable and angry and argumentative stating we are giving her narcotics which we are not of course she states she does not need "all this medication" and she continues to state "you are not listening to me" despite the prolonged interview we have.  Denies wanting to harm self or others irritable angry poor insight No EPS or TD Principal Problem: Bipolar I disorder, most recent episode manic, severe with psychotic features ([place]) Diagnosis: Principal Problem:   Bipolar I disorder, most recent episode manic, severe with psychotic features ([place]) Active Problems:   Schizoaffective disorder ([place])   Severe mixed bipolar 1 disorder without psychosis ([place])  Total Time spent with patient: 20 minutes  Past Medical History:  Past Medical History:  Diagnosis Date  . Bipolar affective disorder ([place])   . History of arthritis   . History of chicken pox   . History of depression   . History of genital warts   . history of heart murmur   . History of high blood pressure   . History of thyroid disease   . History of UTI   . Hypertension   . Low TSH level 07/13/2017  . Schizophrenia [facility])     Past Surgical History:  Procedure Laterality Date  . ABLATION ON ENDOMETRIOSIS    . CYST REMOVAL NECK     around 11 years ago /benign  . MULTIPLE TOOTH EXTRACTIONS     Family History:  Family History  Problem Relation Age of Onset  . Arthritis Father   . Hyperlipidemia Father   . High blood pressure Father   . Diabetes Sister   . Diabetes Mother   . Diabetes Brother   . Mental illness Brother   . Alcohol abuse Paternal Uncle   . Alcohol abuse Paternal Grandfather   . Breast cancer Maternal Aunt   . Breast cancer Paternal Aunt   . High blood pressure Sister   . Mental illness Other        runs in family   Social History:  Social History   Substance and Sexual Activity  Alcohol  Use No     Social History   Substance and Sexual Activity  Drug Use No    Social History   Socioeconomic History  . Marital status: Single    Spouse name: Not on file  . Number of children: Not on file  . Years of education: Not on file  . Highest education level: Not on file  Occupational History  . Not on file  Social Needs  . Financial resource strain: Not on file  . Food insecurity:    Worry: Not on file    Inability: Not on file  . Transportation needs:    Medical: Not on file    Non-medical: Not on file  Tobacco Use  . Smoking status: Never Smoker  . Smokeless tobacco: Never Used  Substance and Sexual Activity  . Alcohol use: No  . Drug use: No  . Sexual activity: Not Currently  Lifestyle  . Physical activity:    Days per week: Not on file    Minutes per session: Not on file  . Stress: Not on file  Relationships  . Social connections:    Talks on phone: Not on file    Gets together: Not on file    Attends religious service: Not on file    Active member  of club or organization: Not on file    Attends meetings of clubs or organizations: Not on file    Relationship status: Not on file  Other Topics Concern  . Not on file  Social History Narrative  . Not on file   Additional Social History:    Pain Medications: See PTA medication list Prescriptions: See PTA medication list Over the Counter: See PTA medication list History of alcohol / drug use?: No history of alcohol / drug abuse(Pt denies.)                    Sleep: Good  Appetite:  Good  Current Medications: Current Facility-Administered Medications  Medication Dose Route Frequency Provider Last Rate Last Dose  . amLODipine (NORVASC) tablet 10 mg  10 mg Oral Daily Laverle Hobby, PA-C   10 mg at 03/18/19 7322  . benztropine (COGENTIN) tablet 0.5 mg  0.5 mg Oral BID Johnn Hai, MD      . clonazePAM Bobbye [place]) tablet 2 mg  2 mg Oral Once Johnn Hai, MD      . fluPHENAZine (PROLIXIN)  tablet 15 mg  15 mg Oral QHS Johnn Hai, MD      . haloperidol (HALDOL) tablet 5 mg  5 mg Oral Q8H PRN Derrill Center, NP       Or  . haloperidol lactate (HALDOL) injection 5 mg  5 mg Intramuscular Q8H PRN Derrill Center, NP   5 mg at 03/18/19 0254  . hydrOXYzine (ATARAX/VISTARIL) tablet 25 mg  25 mg Oral TID PRN Laverle Hobby, PA-C      . lithium carbonate capsule 300 mg  300 mg Oral BID WC Simon, Spencer E, PA-C   300 mg at 03/18/19 0830  . LORazepam (ATIVAN) tablet 1 mg  1 mg Oral Q6H PRN Derrill Center, NP       Or  . LORazepam (ATIVAN) injection 1 mg  1 mg Intramuscular Q6H PRN Derrill Center, NP   1 mg at 03/18/19 0420  . LORazepam (ATIVAN) injection 4 mg  4 mg Intramuscular Once Johnn Hai, MD      . Derrill Memo ON 03/19/2019] metoprolol tartrate (LOPRESSOR) tablet 25 mg  25 mg Oral Daily Johnn Hai, MD      . temazepam (RESTORIL) capsule 30 mg  30 mg Oral QHS Johnn Hai, MD      . vortioxetine HBr (TRINTELLIX) tablet 5 mg  5 mg Oral Daily Laverle Hobby, PA-C   5 mg at 03/14/19 2706    Lab Results: No results found for this or any previous visit (from the past 48 hour(s)).  Blood Alcohol level:  Lab Results  Component Value Date   ETH <10 10/31/2018   ETH <5 23/76/2831    Metabolic Disorder Labs: Lab Results  Component Value Date   HGBA1C 5.6 03/14/2019   MPG 114.02 03/14/2019   MPG 108.28 11/03/2018   Lab Results  Component Value Date   PROLACTIN 11.0 03/14/2019   Lab Results  Component Value Date   CHOL 152 03/14/2019   TRIG 120 03/14/2019   HDL 50 03/14/2019   CHOLHDL 3.0 03/14/2019   VLDL 24 03/14/2019   LDLCALC 78 03/14/2019   LDLCALC 76 11/03/2018    Physical Findings: AIMS: Facial and Oral Movements Muscles of Facial Expression: None, normal Lips and Perioral Area: None, normal Jaw: None, normal Tongue: None, normal,Extremity Movements Upper (arms, wrists, hands, fingers): None, normal Lower (legs, knees, ankles, toes): None, normal,  Trunk Movements Neck, shoulders, hips: None, normal, Overall Severity Severity of abnormal movements (highest score from questions above): None, normal Incapacitation due to abnormal movements: None, normal Patient's awareness of abnormal movements (rate only patient's report): No Awareness, Dental Status Current problems with teeth and/or dentures?: No Does patient usually wear dentures?: No  CIWA:  CIWA-Ar Total: 0 COWS:  COWS Total Score: 2  Musculoskeletal: Strength & Muscle Tone: within normal limits Gait & Station: normal Patient leans: N/A  Psychiatric Specialty Exam: Physical Exam  ROS  Blood pressure (!) 166/103, pulse 72, temperature 98.7 F (37.1 C), temperature source Oral, resp. rate 16, SpO2 93 %.There is no height or weight on file to calculate BMI.  General Appearance: Disheveled  Eye Contact:  Fair  Speech:  Clear and Coherent  Volume:  Increased  Mood:  Anxious and Irritable  Affect:  Restricted  Thought Process:  Goal Directed  Orientation:  Full (Time, Place, and Person)  Thought Content:  Delusions and Obsessions  Suicidal Thoughts:  No  Homicidal Thoughts:  No  Memory:  Immediate;   Poor  Judgement:  Impaired  Insight:  Lacking  Psychomotor Activity:  Decreased  Concentration:  Concentration: Fair  Recall:  [employer] of Knowledge:  Fair  Language:  Poor  Akathisia:  Yes  Handed:  Right  AIMS (if indicated):     Assets:  Social Support  ADL's:  Intact  Cognition:  WNL  Sleep:  Number of Hours: 3     Treatment Plan Summary: Daily contact with patient to assess and evaluate symptoms and progress in treatment, Medication management and Plan Cognitive therapy reality-based therapy continue current meds without change  Tattiana Fakhouri, MD 03/18/2019, 9:44 AM

## 2019-03-19 MED ORDER — TEMAZEPAM 30 MG PO CAPS
30.0000 mg | ORAL_CAPSULE | Freq: Every day | ORAL | Status: DC
  Administered 2019-03-23 – 2019-03-31 (×9): 30 mg via ORAL
  Filled 2019-03-19 (×6): qty 1

## 2019-03-19 NOTE — Progress Notes (Signed)
Recreation Therapy Notes  Date:  4.22.20 Time:  1000 Location: 500 Hall Day Room  Group Topic: Communication, Team Building, Problem Solving  Goal Area(s) Addresses:  Patient will effectively work with peer towards shared goal.  Patient will identify skill used to make activity successful.  Patient will identify how skills used during activity can be used to reach post d/c goals.   Behavioral Response:  None  Intervention: STEM Activity   Activity:  [employer].  In teams, patients were asked to build the tallest freestanding tower possible using 15 pipe cleaner.  During the course of the activity, LRT would include stipulations such as putting one hand behind your back and not talking to your partners.  Education: Education officer, community, Dentist.   Education Outcome: Acknowledges education  Clinical Observations/Feedback:  Pt stayed in group long enough to hear the directions and partner up.  Pt left early and did not return.    [name], LRT/CTRS      [name] A 03/19/2019 10:58 AM

## 2019-03-19 NOTE — Tx Team (Signed)
Interdisciplinary Treatment and Diagnostic Plan Update  03/19/2019 Time of Session: 0853 Jarae Nemmers Narducci MRN: [id number]  Principal Diagnosis: Bipolar I disorder, most recent episode manic, severe with psychotic features ([place])  Secondary Diagnoses: Principal Problem:   Bipolar I disorder, most recent episode manic, severe with psychotic features ([place]) Active Problems:   Schizoaffective disorder ([place])   Severe mixed bipolar 1 disorder without psychosis ([place])   Current Medications:  Current Facility-Administered Medications  Medication Dose Route Frequency Provider Last Rate Last Dose  . amLODipine (NORVASC) tablet 10 mg  10 mg Oral Daily Laverle Hobby, PA-C   10 mg at 03/19/19 0744  . benztropine (COGENTIN) tablet 0.5 mg  0.5 mg Oral BID Johnn Hai, MD      . clonazePAM Bobbye [place]) tablet 2 mg  2 mg Oral Once Johnn Hai, MD      . fluPHENAZine (PROLIXIN) tablet 15 mg  15 mg Oral QHS Johnn Hai, MD      . haloperidol (HALDOL) tablet 5 mg  5 mg Oral Q8H PRN Derrill Center, NP       Or  . haloperidol lactate (HALDOL) injection 5 mg  5 mg Intramuscular Q8H PRN Derrill Center, NP   5 mg at 03/18/19 4098  . hydrOXYzine (ATARAX/VISTARIL) tablet 25 mg  25 mg Oral TID PRN Laverle Hobby, PA-C      . lithium carbonate capsule 300 mg  300 mg Oral BID WC Patriciaann Clan E, PA-C   300 mg at 03/19/19 0745  . LORazepam (ATIVAN) tablet 1 mg  1 mg Oral Q6H PRN Derrill Center, NP       Or  . LORazepam (ATIVAN) injection 1 mg  1 mg Intramuscular Q6H PRN Derrill Center, NP   1 mg at 03/18/19 0420  . LORazepam (ATIVAN) injection 4 mg  4 mg Intramuscular Once Johnn Hai, MD      . metoprolol tartrate (LOPRESSOR) tablet 25 mg  25 mg Oral Daily Johnn Hai, MD      . temazepam (RESTORIL) capsule 30 mg  30 mg Oral QHS Hampton Abbot, MD      . vortioxetine HBr (TRINTELLIX) tablet 5 mg  5 mg Oral Daily Laverle Hobby, PA-C   5 mg at 03/19/19 0745   PTA Medications: Medications Prior  to Admission  Medication Sig Dispense Refill Last Dose  . amLODipine (NORVASC) 10 MG tablet Take 1 tablet (10 mg total) by mouth daily. For high blood pressure     . lithium carbonate 300 MG capsule Take 1 capsule (300 mg total) by mouth 2 (two) times daily with a meal. For mood stabilization 60 capsule 0   . OXcarbazepine (TRILEPTAL) 150 MG tablet Take 0.5 tablets (75 mg total) by mouth 2 (two) times daily. For mood stabilization 60 tablet 0   . propranolol ER (INDERAL LA) 80 MG 24 hr capsule Take 1 capsule (80 mg total) by mouth daily. For high blood pressure/anxiety 20 capsule 0   . QUEtiapine (SEROQUEL) 400 MG tablet Take 1 tablet (400 mg total) by mouth at bedtime. For mood control (Patient taking differently: Take 200 mg by mouth at bedtime. For mood control) 30 tablet 0   . vortioxetine HBr (TRINTELLIX) 5 MG TABS tablet Take 1 tablet (5 mg total) by mouth daily. For depression 30 tablet 0     Patient Stressors: Marital or family conflict Medication change or noncompliance  Patient Strengths: Average or above average intelligence Communication skills General fund of knowledge  Physical Health  Treatment Modalities: Medication Management, Group therapy, Case management,  1 to 1 session with clinician, Psychoeducation, Recreational therapy.   Physician Treatment Plan for Primary Diagnosis: Bipolar I disorder, most recent episode manic, severe with psychotic features ([place]) Long Term Goal(s): Improvement in symptoms so as ready for discharge Improvement in symptoms so as ready for discharge   Short Term Goals: Ability to identify changes in lifestyle to reduce recurrence of condition will improve Compliance with prescribed medications will improve  Medication Management: Evaluate patient's response, side effects, and tolerance of medication regimen.  Therapeutic Interventions: 1 to 1 sessions, Unit Group sessions and Medication administration.  Evaluation of Outcomes:  Progressing  Physician Treatment Plan for Secondary Diagnosis: Principal Problem:   Bipolar I disorder, most recent episode manic, severe with psychotic features ([place]) Active Problems:   Schizoaffective disorder ([place])   Severe mixed bipolar 1 disorder without psychosis ([place])  Long Term Goal(s): Improvement in symptoms so as ready for discharge Improvement in symptoms so as ready for discharge   Short Term Goals: Ability to identify changes in lifestyle to reduce recurrence of condition will improve Compliance with prescribed medications will improve     Medication Management: Evaluate patient's response, side effects, and tolerance of medication regimen.  Therapeutic Interventions: 1 to 1 sessions, Unit Group sessions and Medication administration.  Evaluation of Outcomes: Progressing   RN Treatment Plan for Primary Diagnosis: Bipolar I disorder, most recent episode manic, severe with psychotic features ([place]) Long Term Goal(s): Knowledge of disease and therapeutic regimen to maintain health will improve  Short Term Goals: Ability to identify and develop effective coping behaviors will improve and Compliance with prescribed medications will improve  Medication Management: RN will administer medications as ordered by provider, will assess and evaluate patient's response and provide education to patient for prescribed medication. RN will report any adverse and/or side effects to prescribing provider.  Therapeutic Interventions: 1 on 1 counseling sessions, Psychoeducation, Medication administration, Evaluate responses to treatment, Monitor vital signs and CBGs as ordered, Perform/monitor CIWA, COWS, AIMS and Fall Risk screenings as ordered, Perform wound care treatments as ordered.  Evaluation of Outcomes: Progressing   LCSW Treatment Plan for Primary Diagnosis: Bipolar I disorder, most recent episode manic, severe with psychotic features ([place]) Long Term Goal(s): Safe transition to  appropriate next level of care at discharge, Engage patient in therapeutic group addressing interpersonal concerns.  Short Term Goals: Engage patient in aftercare planning with referrals and resources, Increase social support and Increase skills for wellness and recovery  Therapeutic Interventions: Assess for all discharge needs, 1 to 1 time with Social worker, Explore available resources and support systems, Assess for adequacy in community support network, Educate family and significant other(s) on suicide prevention, Complete Psychosocial Assessment, Interpersonal group therapy.  Evaluation of Outcomes: Progressing   Progress in Treatment: Attending groups: Yes. Participating in groups: Yes. Taking medication as prescribed: Yes. Toleration medication: Yes. Family/Significant other contact made: No, will contact:  when given permission Patient understands diagnosis: No. Discussing patient identified problems/goals with staff: Yes. Medical problems stabilized or resolved: Yes. Denies suicidal/homicidal ideation: Yes. Issues/concerns per patient self-inventory: No. Other: none  New problem(s) identified: No, Describe:  none  New Short Term/Long Term Goal(s):  Patient Goals:  "get my sister to do what I want her to do"  Discharge Plan or Barriers:   Reason for Continuation of Hospitalization: Delusions  Medication stabilization  Estimated Length of Stay:3-5 days.  Attendees: Patient: 03/19/2019   Physician: Dr. Jake Samples, MD  03/19/2019   Nursing: Boyce Medici, RN 03/19/2019   RN Care Manager: 03/19/2019   Social Worker: Lurline Idol, LCSW 03/19/2019   Recreational Therapist:  03/19/2019   Other: Ardelle Anton, LCSW 03/19/2019   Other:  03/19/2019   Other: 03/19/2019           Scribe for Treatment Team: Joanne Chars, LCSW 03/19/2019 3:27 PM

## 2019-03-19 NOTE — Progress Notes (Signed)
[facility] MD Progress Note  03/19/2019 10:28 AM Paula Dickson  MRN:  [id number] Subjective:    Pt continues to pick and choose meds Refuses "psych" meds No si  irritable delusional  No sig progress except mania subdued   Principal Problem: Bipolar I disorder, most recent episode manic, severe with psychotic features ([place]) Diagnosis: Principal Problem:   Bipolar I disorder, most recent episode manic, severe with psychotic features ([place]) Active Problems:   Schizoaffective disorder ([place])   Severe mixed bipolar 1 disorder without psychosis ([place])  Total Time spent with patient: 20 minutes   Past Medical History:  Past Medical History:  Diagnosis Date  . Bipolar affective disorder ([place])   . History of arthritis   . History of chicken pox   . History of depression   . History of genital warts   . history of heart murmur   . History of high blood pressure   . History of thyroid disease   . History of UTI   . Hypertension   . Low TSH level 07/13/2017  . Schizophrenia [facility])     Past Surgical History:  Procedure Laterality Date  . ABLATION ON ENDOMETRIOSIS    . CYST REMOVAL NECK     around 11 years ago /benign  . MULTIPLE TOOTH EXTRACTIONS     Family History:  Family History  Problem Relation Age of Onset  . Arthritis Father   . Hyperlipidemia Father   . High blood pressure Father   . Diabetes Sister   . Diabetes Mother   . Diabetes Brother   . Mental illness Brother   . Alcohol abuse Paternal Uncle   . Alcohol abuse Paternal Grandfather   . Breast cancer Maternal Aunt   . Breast cancer Paternal Aunt   . High blood pressure Sister   . Mental illness Other        runs in family   Social History:  Social History   Substance and Sexual Activity  Alcohol Use No     Social History   Substance and Sexual Activity  Drug Use No    Social History   Socioeconomic History  . Marital status: Single    Spouse name: Not on file  . Number of children: Not on file   . Years of education: Not on file  . Highest education level: Not on file  Occupational History  . Not on file  Social Needs  . Financial resource strain: Not on file  . Food insecurity:    Worry: Not on file    Inability: Not on file  . Transportation needs:    Medical: Not on file    Non-medical: Not on file  Tobacco Use  . Smoking status: Never Smoker  . Smokeless tobacco: Never Used  Substance and Sexual Activity  . Alcohol use: No  . Drug use: No  . Sexual activity: Not Currently  Lifestyle  . Physical activity:    Days per week: Not on file    Minutes per session: Not on file  . Stress: Not on file  Relationships  . Social connections:    Talks on phone: Not on file    Gets together: Not on file    Attends religious service: Not on file    Active member of club or organization: Not on file    Attends meetings of clubs or organizations: Not on file    Relationship status: Not on file  Other Topics Concern  . Not on file  Social History Narrative  . Not on file   Additional Social History:    Pain Medications: See PTA medication list Prescriptions: See PTA medication list Over the Counter: See PTA medication list History of alcohol / drug use?: No history of alcohol / drug abuse(Pt denies.)                    Sleep: Fair  Appetite:  Fair  Current Medications: Current Facility-Administered Medications  Medication Dose Route Frequency Provider Last Rate Last Dose  . amLODipine (NORVASC) tablet 10 mg  10 mg Oral Daily Laverle Hobby, PA-C   10 mg at 03/19/19 0744  . benztropine (COGENTIN) tablet 0.5 mg  0.5 mg Oral BID Johnn Hai, MD      . clonazePAM Bobbye [place]) tablet 2 mg  2 mg Oral Once Johnn Hai, MD      . fluPHENAZine (PROLIXIN) tablet 15 mg  15 mg Oral QHS Johnn Hai, MD      . haloperidol (HALDOL) tablet 5 mg  5 mg Oral Q8H PRN Derrill Center, NP       Or  . haloperidol lactate (HALDOL) injection 5 mg  5 mg Intramuscular Q8H PRN  Derrill Center, NP   5 mg at 03/18/19 9629  . hydrOXYzine (ATARAX/VISTARIL) tablet 25 mg  25 mg Oral TID PRN Laverle Hobby, PA-C      . lithium carbonate capsule 300 mg  300 mg Oral BID WC Patriciaann Clan E, PA-C   300 mg at 03/19/19 0745  . LORazepam (ATIVAN) tablet 1 mg  1 mg Oral Q6H PRN Derrill Center, NP       Or  . LORazepam (ATIVAN) injection 1 mg  1 mg Intramuscular Q6H PRN Derrill Center, NP   1 mg at 03/18/19 0420  . LORazepam (ATIVAN) injection 4 mg  4 mg Intramuscular Once Johnn Hai, MD      . metoprolol tartrate (LOPRESSOR) tablet 25 mg  25 mg Oral Daily Johnn Hai, MD      . temazepam (RESTORIL) capsule 30 mg  30 mg Oral QHS Hampton Abbot, MD      . vortioxetine HBr (TRINTELLIX) tablet 5 mg  5 mg Oral Daily Laverle Hobby, PA-C   5 mg at 03/19/19 0745    Lab Results: No results found for this or any previous visit (from the past 43 hour(s)).  Blood Alcohol level:  Lab Results  Component Value Date   ETH <10 10/31/2018   ETH <5 52/84/1324    Metabolic Disorder Labs: Lab Results  Component Value Date   HGBA1C 5.6 03/14/2019   MPG 114.02 03/14/2019   MPG 108.28 11/03/2018   Lab Results  Component Value Date   PROLACTIN 11.0 03/14/2019   Lab Results  Component Value Date   CHOL 152 03/14/2019   TRIG 120 03/14/2019   HDL 50 03/14/2019   CHOLHDL 3.0 03/14/2019   VLDL 24 03/14/2019   LDLCALC 78 03/14/2019   LDLCALC 76 11/03/2018    Physical Findings: AIMS: Facial and Oral Movements Muscles of Facial Expression: None, normal Lips and Perioral Area: None, normal Jaw: None, normal Tongue: None, normal,Extremity Movements Upper (arms, wrists, hands, fingers): None, normal Lower (legs, knees, ankles, toes): None, normal, Trunk Movements Neck, shoulders, hips: None, normal, Overall Severity Severity of abnormal movements (highest score from questions above): None, normal Incapacitation due to abnormal movements: None, normal Patient's awareness of  abnormal movements (rate only patient's report): No Awareness, Dental  Status Current problems with teeth and/or dentures?: No Does patient usually wear dentures?: No  CIWA:  CIWA-Ar Total: 0 COWS:  COWS Total Score: 2  Musculoskeletal: Strength & Muscle Tone: within normal limits Gait & Station: normal Patient leans: N/A  Psychiatric Specialty Exam: Physical Exam HTN  ROS no invol movements  Blood pressure (!) 148/95, pulse 97, temperature 98.4 F (36.9 C), temperature source Oral, resp. rate 20, SpO2 93 %.There is no height or weight on file to calculate BMI.  General Appearance: Casual  Eye Contact:  Good  Speech:  Clear and Coherent  Volume:  Increased  Mood:  Irritable  Affect:  Congruent  Thought Process:  Irrelevant - delusional at times  Orientation:  Full (Time, Place, and Person)  Thought Content:  Tangential - rambling  Suicidal Thoughts:  No  Homicidal Thoughts:  No  Memory:  Immediate;   Fair  Judgement:  Fair  Insight:  Shallow  Psychomotor Activity:  Normal  Concentration:  Concentration: Poor  Recall:  [employer] of Knowledge:  Fair  Language:  Fair  Akathisia:  Negative  Handed:  Right  AIMS (if indicated):     Assets:  Leisure Time Physical Health  ADL's:  Intact  Cognition:  WNL  Sleep:  Number of Hours: 3     Treatment Plan Summary: Daily contact with patient to assess and evaluate symptoms and progress in treatment, Medication management and Plan cont to encourage compliance- cont meds as ordered  Johnn Hai, MD 03/19/2019, 10:28 AM

## 2019-03-19 NOTE — Progress Notes (Signed)
Psychoeducational Group Note  Date:  03/19/2019 Time:  0900  Group Topic/Focus:  Self Esteem Action Plan:   The focus of this group is to help patients create a plan to continue to build self-esteem after discharge.  Participation Level: Did Not Attend  Participation Quality:  Not Applicable  Affect:  Not Applicable  Cognitive:  Not Applicable  Insight:  Not Applicable  Engagement in Group: Not Applicable  Additional Comments:  Pt walked out at the start of the group session.  Jasiah Elsen E 03/19/2019, 8:47 AM

## 2019-03-19 NOTE — Progress Notes (Signed)
Patient ID: Paula Dickson, female   DOB: 06/19/1966, 53 y.o.   MRN: [id number] [place] NOVEL CORONAVIRUS (COVID-19) DAILY CHECK-OFF SYMPTOMS - answer yes or no to each - every day NO YES  Have you had a fever in the past 24 hours?  . Fever (Temp > 37.80C / 100F) X   Have you had any of these symptoms in the past 24 hours? . New Cough .  Sore Throat  .  Shortness of Breath .  Difficulty Breathing .  Unexplained Body Aches   X   Have you had any one of these symptoms in the past 24 hours not related to allergies?   . Runny Nose .  Nasal Congestion .  Sneezing   X   If you have had runny nose, nasal congestion, sneezing in the past 24 hours, has it worsened?  X   EXPOSURES - check yes or no X   Have you traveled outside the state in the past 14 days?  X   Have you been in contact with someone with a confirmed diagnosis of COVID-19 or PUI in the past 14 days without wearing appropriate PPE?  X   Have you been living in the same home as a person with confirmed diagnosis of COVID-19 or a PUI (household contact)?    X   Have you been diagnosed with COVID-19?    X              What to do next: Answered NO to all: Answered YES to anything:   Proceed with unit schedule Follow the [facility] Inpatient Flowsheet.

## 2019-03-19 NOTE — Progress Notes (Signed)
[place] NOVEL CORONAVIRUS (COVID-19) DAILY CHECK-OFF SYMPTOMS - answer yes or no to each - every day NO YES  Have you had a fever in the past 24 hours?  . Fever (Temp > 37.80C / 100F) X   Have you had any of these symptoms in the past 24 hours? . New Cough .  Sore Throat  .  Shortness of Breath .  Difficulty Breathing .  Unexplained Body Aches   X   Have you had any one of these symptoms in the past 24 hours not related to allergies?   . Runny Nose .  Nasal Congestion .  Sneezing   X   If you have had runny nose, nasal congestion, sneezing in the past 24 hours, has it worsened?  X   EXPOSURES - check yes or no X   Have you traveled outside the state in the past 14 days?  X   Have you been in contact with someone with a confirmed diagnosis of COVID-19 or PUI in the past 14 days without wearing appropriate PPE?  X   Have you been living in the same home as a person with confirmed diagnosis of COVID-19 or a PUI (household contact)?    X   Have you been diagnosed with COVID-19?    X              What to do next: Answered NO to all: Answered YES to anything:   Proceed with unit schedule Follow the [facility] Inpatient Flowsheet.   

## 2019-03-19 NOTE — [facility] Group Notes (Signed)
  [facility] LCSW Group Therapy Note  Date/Time: 03/19/2019, 1315  Type of Therapy/Topic:  Group Therapy:  Emotion Regulation  Participation Level:  Active   Mood: Pleasant  Description of Group:    The purpose of this group is to assist patients in learning to regulate negative emotions and experience positive emotions. Patients will be guided to discuss ways in which they have been vulnerable to their negative emotions. These vulnerabilities will be juxtaposed with experiences of positive emotions or situations, and patients challenged to use positive emotions to combat negative ones. Special emphasis will be placed on coping with negative emotions in conflict situations, and patients will process healthy conflict resolution skills.  Therapeutic Goals: 1. Patient will identify two positive emotions or experiences to reflect on in order to balance out negative emotions:  2. Patient will label two or more emotions that they find the most difficult to experience:  3. Patient will be able to demonstrate positive conflict resolution skills through discussion or role plays:   Summary of Patient Progress:  Pt was active in group and was talkative. Pt answered all questions asked by the CSW. Pt continued to divert to conversations about her family but was able to be redirected back to topic. Pt was able to identify that frustration was an emotion that she found the most difficult to experience.      Therapeutic Modalities:   Cognitive Behavioral Therapy Feelings Identification Dialectical Behavioral Therapy  Lurline Idol, LCSW

## 2019-03-19 NOTE — Progress Notes (Addendum)
Patient ID: Paula Dickson, female   DOB: 05-16-1966, 53 y.o.   MRN: [id number] D) Pt has been intrusive but calm and pleasant this afternoon but remains suspious. Pt asked Probation officer for worksheets to "work on" in her room. Pt has been unable to remain in groups this shift. Pt attends but does not engage and leaves early. Thoughts disorganized if engaged in conversation beyond the superficial and minimal. Pt denies AVH. Pt non compliant with psych meds. A) Level 3 obs for safety supported. Encourage pt to be more active in milieu and activities. Support provided. Med ed reinforced. R) Pleasant. Calm.

## 2019-03-19 NOTE — Progress Notes (Addendum)
[name] attended wrap-up group. She denies SI/HI/AVH/Pain at present. Pt appears flat/anxious/irritable/argumentative/paranoid/labile in affect and mood. Pt refused her bedtime meds stating "This is my right; I can refuse. I can sleep fine". Pt is intrusive as evidence by Pt consistently coming up to the NS; appears to be listening in to staff. Pt requires frequent redirection. Pt is restless and is observe going in/out of her room; disturbing her roommate. Will continue to montior her behavior for increase in agitation. Support offered. Will continue with POC.

## 2019-03-19 NOTE — Progress Notes (Signed)
DAR NOTE: Patient presents with anxious affect and depressed mood. Pt observed pacing in the hall way back and forth. Pt refused her night medications stating that they are note the right medications and therefore are not working for her. Pt demanded to know more about the medication and so print out was given, but still refused to take. Denies pain, auditory and visual hallucinations. Maintained on routine safety checks.  Medications given as prescribed.  Support and encouragement offered as needed.  Will continue to monitor.

## 2019-03-19 NOTE — Progress Notes (Signed)
Pt up asking for salt stating that would help her sleep. Pt was informed that she was having a hard time sleeping because she did not take anything to help her sleep. Pt began ranting " the Reita Cliche will help you sleep"

## 2019-03-19 NOTE — Progress Notes (Signed)
Patient ID: Aviela Blundell Sipe, female   DOB: 10/29/1966, 53 y.o.   MRN: [id number]  [place] NOVEL CORONAVIRUS (COVID-19) DAILY CHECK-OFF SYMPTOMS - answer yes or no to each - every day NO YES  Have you had a fever in the past 24 hours?  . Fever (Temp > 37.80C / 100F) X   Have you had any of these symptoms in the past 24 hours? . New Cough .  Sore Throat  .  Shortness of Breath .  Difficulty Breathing .  Unexplained Body Aches   X   Have you had any one of these symptoms in the past 24 hours not related to allergies?   . Runny Nose .  Nasal Congestion .  Sneezing   X   If you have had runny nose, nasal congestion, sneezing in the past 24 hours, has it worsened?  X   EXPOSURES - check yes or no X   Have you traveled outside the state in the past 14 days?  X   Have you been in contact with someone with a confirmed diagnosis of COVID-19 or PUI in the past 14 days without wearing appropriate PPE?  X   Have you been living in the same home as a person with confirmed diagnosis of COVID-19 or a PUI (household contact)?    X   Have you been diagnosed with COVID-19?    X              What to do next: Answered NO to all: Answered YES to anything:   Proceed with unit schedule Follow the [facility] Inpatient Flowsheet.

## 2019-03-20 MED ORDER — HALOPERIDOL LACTATE 2 MG/ML PO CONC
5.0000 mg | Freq: Three times a day (TID) | ORAL | Status: DC
  Filled 2019-03-20 (×12): qty 2.5

## 2019-03-20 MED ORDER — HALOPERIDOL LACTATE 5 MG/ML IJ SOLN
5.0000 mg | Freq: Three times a day (TID) | INTRAMUSCULAR | Status: DC
  Administered 2019-03-20 – 2019-03-22 (×6): 5 mg via INTRAMUSCULAR
  Filled 2019-03-20 (×12): qty 1

## 2019-03-20 MED ORDER — DIPHENHYDRAMINE HCL 50 MG/ML IJ SOLN
INTRAMUSCULAR | Status: AC
  Filled 2019-03-20: qty 1

## 2019-03-20 MED ORDER — DIPHENHYDRAMINE HCL 50 MG/ML IJ SOLN
50.0000 mg | Freq: Once | INTRAMUSCULAR | Status: AC
  Administered 2019-03-20: 50 mg via INTRAMUSCULAR
  Filled 2019-03-20: qty 1

## 2019-03-20 NOTE — [facility] Group Notes (Signed)
Pt did not attend morning goals group. 

## 2019-03-20 NOTE — Progress Notes (Addendum)
   03/20/19 0155  Description of events or circumstances leading to restrictive event  Precipitating circumstances leading to onset of behavior Pt threatening, yelling, waking up pt's on hall, verbally agressive   Patient Behavior Threatening Posture/Words  Clinical Justification for Restrictive Event  Imminent danger to: Self;Others  Patient is restrained for the purpose of administering medication Not applicable  Less Restrictive Interventions Tried Prior to Seclusion/Restraint  Comfort Interventions used prior to seclusion or restraint Pain Relief;Sleep;Food/Drink;Elimination Needs  Therapeutic Interventions used prior to seclusion or restraint Redirection;Time out;Kind, Enforceable Limits;Show of Support;Remove from Situation;Verbal Deescalation  Diversionary Interventions used prior to seclusion or restraint Physical Activity;Reading  Other  Interventions used prior to seclusion or restraint Other (Comment) (verbal de-eslation )  Patient's response to Less Restrictive Intervention Increase in Behavior  Interventions used in Restrictive Event Upper Arm/ Forearm escort to patient room  RN Initiating Seclusion or Restraint Lonia Skinner, RN   Date Seclusion or Restraint initiated 03/20/19  Time Seclusion or Restraint Initiated 0155  Restriction order entered into order management  (Basic two arm hold to room for purpose of med admin )  Criteria for release from seclusion or restraint Successful Medication Administration  Patient informed of Criteria for Release? Yes  Admission Assessment & Health History reviewed & considered prior to intervention? Yes  Pre-existing medical conditions, disabilities, or limitations that place pt at greater risk for seclusion or restraint? None  Health Status prior to intervention No problems noted  [place] Adminstrator Notification  [facility] Nurse Administrator on call notified Yes  Name of [facility] [place] Nurse Administrator on call notified Wynonia Hazard, RN   Date [facility]  Nurse Administrator notified 03/20/19  Time [facility] Nurse Administrator notified 0155  [facility] Administration on call notified  (A.C. already on hall )   [place] has not been able to sleep all night. Pt has been restless; coming in/out of her room-waking up her roommate and yelling loudly in the hallway. Pt is preoccupied with IVC papers. Writer assure Pt that she will answer her questions in the a.m. after she has try to rest. Pt became increasingly agitated, posturing, and verbally aggressive towards staff. Pt remains paranoid and confused. Pt is wandering hallway and is currently refusing to go back to her room. Writer and multiple staff members have try to de-escalate her without progress. Security and provider on call notified. See MAR. A.C. already on hall. Upper arm/Forearm escort to room by security and PRN meds administered at 0156. Will monitor.

## 2019-03-20 NOTE — Progress Notes (Signed)
Pt currently out at NS; observe talking to herself.

## 2019-03-20 NOTE — Progress Notes (Signed)
Recreation Therapy Notes  Date: 4.23.20 Time: 1000 Location: 500 Hall Dayroom  Group Topic: Goal Setting  Goal Area(s) Addresses:  Patient will be able to identify at least 3 life goals.  Patient will be able to identify benefit of setting life goals.   Behavioral Response:  Engaged  Intervention: Worksheet, pencils  Activity:  Goal Planning.  Patients were to set goals they want to accomplish within the week, month, year and five years.  Patients were to then identify any obstacles that would prevent them from reaching their goals, what they need to achieve goals and what they can start doing not to work towards goal.  Education:  Discharge Planning, Radiographer, therapeutic, Leisure Education   Education Outcome: Acknowledges Education/In Group Clarification Provided/Needs Additional Education  Clinical Observations:  Pt was confused about what she wanted to accomplish.  Pt expressed wanting to see an orthodontist and see her dad today.  Pt also expressed wanting to get her cell phone and personal belongings.  Pt also wants to purchase a 2020 car fully loaded.    [name], LRT/CTRS    [name] A 03/20/2019 10:28 AM

## 2019-03-20 NOTE — Progress Notes (Signed)
Pt woke up and came to NS asking for a comb. A comb was provided to her by request. Pt began yelling at writer "I'm not scared of you"! Pt remains paranoid, disorganized, and easily irritable when needs are not met ASAP.

## 2019-03-20 NOTE — Progress Notes (Signed)
   03/20/19 0500  Sleep  Number of Hours 2.75 (recently asleep )

## 2019-03-20 NOTE — Progress Notes (Signed)
[name] MD Progress Note  03/20/2019 10:28 AM Paula Dickson  MRN:  [id number] Subjective:    Patient continues to refuse various medications continues to be defiant and argumentative, had to go without a roommate last night because she was being so difficult to her roommate as discussed in the chart. Alert oriented irritable intrusive demanding multiple interviews per morning.  Forced med order has been agreed to by Dr. Mallie Darting Principal Problem: Bipolar I disorder, most recent episode manic, severe with psychotic features ([place]) Diagnosis: Principal Problem:   Bipolar I disorder, most recent episode manic, severe with psychotic features ([place]) Active Problems:   Schizoaffective disorder ([place])   Severe mixed bipolar 1 disorder without psychosis ([place])  Total Time spent with patient: 20 minutes  Past Medical History:  Past Medical History:  Diagnosis Date  . Bipolar affective disorder ([place])   . History of arthritis   . History of chicken pox   . History of depression   . History of genital warts   . history of heart murmur   . History of high blood pressure   . History of thyroid disease   . History of UTI   . Hypertension   . Low TSH level 07/13/2017  . Schizophrenia [facility])     Past Surgical History:  Procedure Laterality Date  . ABLATION ON ENDOMETRIOSIS    . CYST REMOVAL NECK     around 11 years ago /benign  . MULTIPLE TOOTH EXTRACTIONS     Family History:  Family History  Problem Relation Age of Onset  . Arthritis Father   . Hyperlipidemia Father   . High blood pressure Father   . Diabetes Sister   . Diabetes Mother   . Diabetes Brother   . Mental illness Brother   . Alcohol abuse Paternal Uncle   . Alcohol abuse Paternal Grandfather   . Breast cancer Maternal Aunt   . Breast cancer Paternal Aunt   . High blood pressure Sister   . Mental illness Other        runs in family    Social History:  Social History   Substance and Sexual Activity  Alcohol Use  No     Social History   Substance and Sexual Activity  Drug Use No    Social History   Socioeconomic History  . Marital status: Single    Spouse name: Not on file  . Number of children: Not on file  . Years of education: Not on file  . Highest education level: Not on file  Occupational History  . Not on file  Social Needs  . Financial resource strain: Not on file  . Food insecurity:    Worry: Not on file    Inability: Not on file  . Transportation needs:    Medical: Not on file    Non-medical: Not on file  Tobacco Use  . Smoking status: Never Smoker  . Smokeless tobacco: Never Used  Substance and Sexual Activity  . Alcohol use: No  . Drug use: No  . Sexual activity: Not Currently  Lifestyle  . Physical activity:    Days per week: Not on file    Minutes per session: Not on file  . Stress: Not on file  Relationships  . Social connections:    Talks on phone: Not on file    Gets together: Not on file    Attends religious service: Not on file    Active member of club or organization: Not on  file    Attends meetings of clubs or organizations: Not on file    Relationship status: Not on file  Other Topics Concern  . Not on file  Social History Narrative  . Not on file   Additional Social History:    Pain Medications: See PTA medication list Prescriptions: See PTA medication list Over the Counter: See PTA medication list History of alcohol / drug use?: No history of alcohol / drug abuse(Pt denies.)                    Sleep: Fair  Appetite:  Fair  Current Medications: Current Facility-Administered Medications  Medication Dose Route Frequency Provider Last Rate Last Dose  . diphenhydrAMINE (BENADRYL) 50 MG/ML injection           . amLODipine (NORVASC) tablet 10 mg  10 mg Oral Daily Laverle Hobby, PA-C   10 mg at 03/19/19 0744  . benztropine (COGENTIN) tablet 0.5 mg  0.5 mg Oral BID Johnn Hai, MD      . clonazePAM Bobbye [place]) tablet 2 mg  2 mg Oral  Once Johnn Hai, MD      . fluPHENAZine (PROLIXIN) tablet 15 mg  15 mg Oral QHS Johnn Hai, MD      . haloperidol (HALDOL) tablet 5 mg  5 mg Oral Q8H PRN Derrill Center, NP       Or  . haloperidol lactate (HALDOL) injection 5 mg  5 mg Intramuscular Q8H PRN Derrill Center, NP   5 mg at 03/20/19 0156  . hydrOXYzine (ATARAX/VISTARIL) tablet 25 mg  25 mg Oral TID PRN Laverle Hobby, PA-C      . lithium carbonate capsule 300 mg  300 mg Oral BID WC Patriciaann Clan E, PA-C   300 mg at 03/19/19 1722  . LORazepam (ATIVAN) tablet 1 mg  1 mg Oral Q6H PRN Derrill Center, NP       Or  . LORazepam (ATIVAN) injection 1 mg  1 mg Intramuscular Q6H PRN Derrill Center, NP   1 mg at 03/20/19 0156  . LORazepam (ATIVAN) injection 4 mg  4 mg Intramuscular Once Johnn Hai, MD      . metoprolol tartrate (LOPRESSOR) tablet 25 mg  25 mg Oral Daily Johnn Hai, MD      . temazepam (RESTORIL) capsule 30 mg  30 mg Oral QHS Hampton Abbot, MD      . vortioxetine HBr (TRINTELLIX) tablet 5 mg  5 mg Oral Daily Laverle Hobby, PA-C   5 mg at 03/19/19 0745    Lab Results: No results found for this or any previous visit (from the past 22 hour(s)).  Blood Alcohol level:  Lab Results  Component Value Date   ETH <10 10/31/2018   ETH <5 29/79/8921    Metabolic Disorder Labs: Lab Results  Component Value Date   HGBA1C 5.6 03/14/2019   MPG 114.02 03/14/2019   MPG 108.28 11/03/2018   Lab Results  Component Value Date   PROLACTIN 11.0 03/14/2019   Lab Results  Component Value Date   CHOL 152 03/14/2019   TRIG 120 03/14/2019   HDL 50 03/14/2019   CHOLHDL 3.0 03/14/2019   VLDL 24 03/14/2019   LDLCALC 78 03/14/2019   LDLCALC 76 11/03/2018    Physical Findings: AIMS: Facial and Oral Movements Muscles of Facial Expression: None, normal Lips and Perioral Area: None, normal Jaw: None, normal Tongue: None, normal,Extremity Movements Upper (arms, wrists, hands, fingers): None, normal  Lower (legs,  knees, ankles, toes): None, normal, Trunk Movements Neck, shoulders, hips: None, normal, Overall Severity Severity of abnormal movements (highest score from questions above): None, normal Incapacitation due to abnormal movements: None, normal Patient's awareness of abnormal movements (rate only patient's report): No Awareness, Dental Status Current problems with teeth and/or dentures?: No Does patient usually wear dentures?: No  CIWA:  CIWA-Ar Total: 0 COWS:  COWS Total Score: 2  Musculoskeletal: Strength & Muscle Tone: within normal limits Gait & Station: normal Patient leans: N/A  Psychiatric Specialty Exam: Physical Exam  ROS  Blood pressure (!) 143/104, pulse (!) 103, temperature 98.4 F (36.9 C), temperature source Oral, resp. rate 20, SpO2 93 %.There is no height or weight on file to calculate BMI.  General Appearance: Casual  Eye Contact:  Fair  Speech:  Clear and Coherent  Volume:  Increased  Mood:  hypomanic and irritable   Affect:  Congruent  Thought Process:  Irrelevant and Descriptions of Associations: Loose  Orientation:  Full (Time, Place, and Person)  Thought Content:  Illogical, Delusions, Paranoid Ideation and Tangential  Suicidal Thoughts:  No  Homicidal Thoughts:  No  Memory:  Recent;   Fair  Judgement:  Impaired  Insight:  Lacking  Psychomotor Activity:  Normal  Concentration:  Attention Span: Poor  Recall:  [place] of Knowledge:  Poor  Language:  Fair  Akathisia:  Negative  Handed:  Right  AIMS (if indicated):     Assets:  Resilience Social Support  ADL's:  Intact  Cognition:  WNL  Sleep:  Number of Hours: 2.75(recently asleep )     Treatment Plan Summary: Daily contact with patient to assess and evaluate symptoms and progress in treatment and Medication management forced meds to begin today in order to control this mania irritability and disruptive behaviors  Johnn Hai, MD 03/20/2019, 10:28 AM

## 2019-03-20 NOTE — B[facility] Group Notes (Signed)
[place] LCSW Group Therapy Note  Date/Time: 03/20/19, 1315  Type of Therapy/Topic:  Group Therapy:  Balance in Life  Participation Level:  moderate  Description of Group:    This group will address the concept of balance and how it feels and looks when one is unbalanced. Patients will be encouraged to process areas in their lives that are out of balance, and identify reasons for remaining unbalanced. Facilitators will guide patients utilizing problem- solving interventions to address and correct the stressor making their life unbalanced. Understanding and applying boundaries will be explored and addressed for obtaining  and maintaining a balanced life. Patients will be encouraged to explore ways to assertively make their unbalanced needs known to significant others in their lives, using other group members and facilitator for support and feedback.  Therapeutic Goals: 1. Patient will identify two or more emotions or situations they have that consume much of in their lives. 2. Patient will identify signs/triggers that life has become out of balance:  3. Patient will identify two ways to set boundaries in order to achieve balance in their lives:  4. Patient will demonstrate ability to communicate their needs through discussion and/or role plays  Summary of Patient Progress:Pt more reserved in group today, said she was going to let other group members speak.  Pt still made several comments and was attentive.  Pt continues to turn every speaking opportunity into a discussion of how she has been mistreated by others.           Therapeutic Modalities:   Cognitive Behavioral Therapy Solution-Focused Therapy Assertiveness Training  Lurline Idol, [place]

## 2019-03-20 NOTE — Progress Notes (Signed)
Patient was refusing medications, dodging staff when told it was time to take medicine. Patient was presented with the options to either take the oral solution, or get a shot. MD tried to talk with patient about taking the oral solution. Patient refused. Patient was then given 5 mg Haldol IM in her right deltoid given by Chrys Racer, RN. Staff did not have to restrain patient, as patient willing laid still during the injection.

## 2019-03-20 NOTE — Progress Notes (Signed)
Patient continues to refuse medications. A forced medication order was received from Jake Samples MD and Mallie Darting MD. Patient was given Haldol 5 mg twice today, one in each deltoid. Patient did not require restraints from staff, but show of force was required. Patient has been verbally abusive towards staff all day.

## 2019-03-20 NOTE — Progress Notes (Signed)
[place] NOVEL CORONAVIRUS (COVID-19) DAILY CHECK-OFF SYMPTOMS - answer yes or no to each - every day NO YES  Have you had a fever in the past 24 hours?  . Fever (Temp > 37.80C / 100F) X   Have you had any of these symptoms in the past 24 hours? . New Cough .  Sore Throat  .  Shortness of Breath .  Difficulty Breathing .  Unexplained Body Aches   X   Have you had any one of these symptoms in the past 24 hours not related to allergies?   . Runny Nose .  Nasal Congestion .  Sneezing   X   If you have had runny nose, nasal congestion, sneezing in the past 24 hours, has it worsened?  X   EXPOSURES - check yes or no X   Have you traveled outside the state in the past 14 days?  X   Have you been in contact with someone with a confirmed diagnosis of COVID-19 or PUI in the past 14 days without wearing appropriate PPE?  X   Have you been living in the same home as a person with confirmed diagnosis of COVID-19 or a PUI (household contact)?    X   Have you been diagnosed with COVID-19?    X              What to do next: Answered NO to all: Answered YES to anything:   Proceed with unit schedule Follow the [facility] Inpatient Flowsheet.   

## 2019-03-20 NOTE — Progress Notes (Signed)
Paula Dickson denies SI/HI/AVH/Pain at present. Pt appears flat/anxious/irritable/argumentative/paranoid/labile in affect and mood.Pt refused Restoril "I sleep fine!". Pt is intrusive as evidence her consistently coming up to the NS; appears to be listening in to staff and demanding random things/requests.Pt requires frequent redirection. Pt is restless and is observe going in/out of her room. Support offered. Will continue with POC.

## 2019-03-20 NOTE — Plan of Care (Signed)
  Problem: Coping: Goal: Ability to verbalize frustrations and anger appropriately will improve Outcome: Not Progressing   Problem: Health Behavior/Discharge Planning: Goal: Ability to implement measures to prevent violent behavior in the future will improve Outcome: Not Progressing   Problem: Safety: Goal: Ability to demonstrate self-control will improve Outcome: Not Progressing Goal: Ability to redirect hostility and anger into socially appropriate behaviors will improve Outcome: Not Progressing

## 2019-03-20 NOTE — Progress Notes (Signed)
Patient self inventory- Patient slept well last ngith. Appetite is good, energy level normal, concentration good. Depression, hopelessness, and anxiety rated 0, 0, 0. Patient is experiencing irritability as well as mouth pain. Goal is "call home at a reasonable time to talk or inquire about daddy." Patient denies SI HI AVH at this time. Patient is refusing medications, IM medicine administered by forced med order. Safety is monitored with 15 minute checks as well as environmental checks. Will continue to monitor.

## 2019-03-20 NOTE — Progress Notes (Signed)
Pt is asleep

## 2019-03-21 MED ORDER — METOPROLOL TARTRATE 50 MG PO TABS
50.0000 mg | ORAL_TABLET | Freq: Two times a day (BID) | ORAL | Status: DC
  Administered 2019-03-23: 50 mg via ORAL
  Filled 2019-03-21 (×6): qty 1

## 2019-03-21 NOTE — Progress Notes (Signed)
Recreation Therapy Notes  Date: 4.24.20 Time: 1000 Location: 500 Hall Dayroom  Group Topic: Self-Esteem  Goal Area(s) Addresses:  Patient will successfully identify positive attributes about themselves.  Patient will successfully identify benefit of improved self-esteem.   Intervention: [employer], Paper  Activity: Personalized Plates.  Patients were to use supplies offered to create a personalized license plate that highlighted some of things that make them unique.  Education:  Self-Esteem, Dentist.   Education Outcome: Acknowledges education/In group clarification offered/Needs additional education  Clinical Observations/Feedback:  Pt did not attend group.    [name], LRT/CTRS    [name] A 03/21/2019 10:49 AM

## 2019-03-21 NOTE — Progress Notes (Signed)
[place] NOVEL CORONAVIRUS (COVID-19) DAILY CHECK-OFF SYMPTOMS - answer yes or no to each - every day NO YES  Have you had a fever in the past 24 hours?  . Fever (Temp > 37.80C / 100F) X   Have you had any of these symptoms in the past 24 hours? . New Cough .  Sore Throat  .  Shortness of Breath .  Difficulty Breathing .  Unexplained Body Aches   X   Have you had any one of these symptoms in the past 24 hours not related to allergies?   . Runny Nose .  Nasal Congestion .  Sneezing   X   If you have had runny nose, nasal congestion, sneezing in the past 24 hours, has it worsened?  X   EXPOSURES - check yes or no X   Have you traveled outside the state in the past 14 days?  X   Have you been in contact with someone with a confirmed diagnosis of COVID-19 or PUI in the past 14 days without wearing appropriate PPE?  X   Have you been living in the same home as a person with confirmed diagnosis of COVID-19 or a PUI (household contact)?    X   Have you been diagnosed with COVID-19?    X              What to do next: Answered NO to all: Answered YES to anything:   Proceed with unit schedule Follow the [facility] Inpatient Flowsheet.   

## 2019-03-21 NOTE — Progress Notes (Signed)
[facility] Second Physician Opinion Progress Note for Medication Administration to Non-consenting Patients (For Involuntarily Committed Patients)  Patient: Paula Dickson Date of Birth: [id number] MRN: [id number]  Reason for the Medication: The patient, without the benefit of the specific treatment measure, is incapable of participating in any available treatment plan that will give the patient a realistic opportunity of improving the patient's condition. There is, without the benefit of the specific treatment measure, a significant possibility that the patient will harm self or others before improvement of the patient's condition is realized.  Consideration of Side Effects: Consideration of the side effects related to the medication plan has been given.  Rationale for Medication Administration: Patient is seen and examined.  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  I was asked to see the patient with regard to forced medications.  The patient was admitted on 03/14/2019.  She was clearly manic on admission.  She was yelling, having threatening behaviors, and focused on her sister.  She was significantly loud and angry.  Throughout the course of the hospitalization he has been noncompliant with her psychiatric medicines.  She remains agitated, irritable and paranoid.  She would not speak to me today except in the day room in front of other patients, and I was concerned about patient confidentiality.  I attempted to explain that to the patient.  She kept on looking in her room as though something was going on inside, and clearly she was paranoid.  She has remained irritable intrusive and demanding throughout the hospitalization.  It is my opinion that the patient requires forced medications to prevent any further deterioration or harm to herself, other  patients and staff.  Without these forced medications continued deterioration is a significant possibility.    Sharma Covert, MD 03/21/19  9:22 AM   This documentation is good for (7) seven days from the date of the MD signature. New documentation must be completed every seven (7) days with detailed justification in the medical record if the patient requires continued non-emergent administration of psychotropic medications.

## 2019-03-21 NOTE — Progress Notes (Signed)
The focus of this group is to help patients establish daily goals to achieve during treatment and discuss how the patient can incorporate goal setting into their daily lives to aide in recovery.The patient also said that her goal was to call cousin.

## 2019-03-21 NOTE — Progress Notes (Signed)
Pt refused evening b/p check. Pt refused to take evening meds except for lithium. Haldol IM administered per force med order.

## 2019-03-21 NOTE — Progress Notes (Addendum)
SPIRITUAL CARE GROUP    Pt attended spiritual care group facilitated by chaplain Jerene Pitch, MDiv, [facility]    Group focused on topic of [place].  Pts engaged in facilitated dialog around meaning of hope.  Participated in visual explorer exercise to define hope for themselves today.   Present throughout group.  Attentive  to others and engaged in conversation voluntarily.  Spoke of hope in being connected to the group.  She related elements of her faith that bring her hope.  When speaking of what felt hopeful today, she chose a picture of flowers and stated she sees God's presence in beauty.

## 2019-03-21 NOTE — Progress Notes (Signed)
Paula Dickson denies SI/HI/AVH/Pain at present. Pt appears flat/paranoid/labile in affect and mood.Pt refused Restoril HS. Pt is calmer today, observe reading in the dayroom. Pt gets easily agitated when she doesn't get her way. Support offered. Will continue with POC.

## 2019-03-21 NOTE — Progress Notes (Addendum)
Pt presents with a flat affect and a labile mood. Pt noted to have disorganized thoughts, tangential speech and moderate confusion. Pt noted to be easily agitated and irritable throughout the day. Pt intrusive and argumentative with staff. Pt refuses to take scheduled meds and meds are forced per force med order. Pt noted to be hypertensive this am and continues to refuse her b/p medication Lopressor. Education provided to pt regarding the significant's of taking the b/p medication but no pt understanding was observed. Dr. Jake Samples notified that the pt continues to refuse scheduled medications and the pt's elevated b/p. Per MD, pt lopressor was increased for hypertension.   Medications reviewed with pt. Verbal support provided. Pt encouraged to attend groups. 15 minute checks performed for safety.   Pt resistant to tx.

## 2019-03-21 NOTE — Progress Notes (Signed)
Pt requested Bible - Chaplain delivered Bible (New Testament and Psalms) to pt on [address] hall.     Jerene Pitch, MDiv, [facility]

## 2019-03-21 NOTE — Progress Notes (Signed)
[facility] MD Progress Note  03/21/2019 10:21 AM Paula Dickson  MRN:  [id number] Subjective:   Continues to require forced medication for her mania is less irritable and certainly more contained in mood but lacking insight stating she knows what medication she needs denies positive symptoms otherwise no EPS or TD denies thoughts of harming self or others Principal Problem: Bipolar I disorder, most recent episode manic, severe with psychotic features ([place]) Diagnosis: Principal Problem:   Bipolar I disorder, most recent episode manic, severe with psychotic features ([place]) Active Problems:   Schizoaffective disorder ([place])   Severe mixed bipolar 1 disorder without psychosis ([place])  Total Time spent with patient: 20 minutes   Past Medical History:  Past Medical History:  Diagnosis Date  . Bipolar affective disorder ([place])   . History of arthritis   . History of chicken pox   . History of depression   . History of genital warts   . history of heart murmur   . History of high blood pressure   . History of thyroid disease   . History of UTI   . Hypertension   . Low TSH level 07/13/2017  . Schizophrenia [facility])     Past Surgical History:  Procedure Laterality Date  . ABLATION ON ENDOMETRIOSIS    . CYST REMOVAL NECK     around 11 years ago /benign  . MULTIPLE TOOTH EXTRACTIONS     Family History:  Family History  Problem Relation Age of Onset  . Arthritis Father   . Hyperlipidemia Father   . High blood pressure Father   . Diabetes Sister   . Diabetes Mother   . Diabetes Brother   . Mental illness Brother   . Alcohol abuse Paternal Uncle   . Alcohol abuse Paternal Grandfather   . Breast cancer Maternal Aunt   . Breast cancer Paternal Aunt   . High blood pressure Sister   . Mental illness Other        runs in family    Social History:  Social History   Substance and Sexual Activity  Alcohol Use No     Social History   Substance and Sexual Activity  Drug Use No     Social History   Socioeconomic History  . Marital status: Single    Spouse name: Not on file  . Number of children: Not on file  . Years of education: Not on file  . Highest education level: Not on file  Occupational History  . Not on file  Social Needs  . Financial resource strain: Not on file  . Food insecurity:    Worry: Not on file    Inability: Not on file  . Transportation needs:    Medical: Not on file    Non-medical: Not on file  Tobacco Use  . Smoking status: Never Smoker  . Smokeless tobacco: Never Used  Substance and Sexual Activity  . Alcohol use: No  . Drug use: No  . Sexual activity: Not Currently  Lifestyle  . Physical activity:    Days per week: Not on file    Minutes per session: Not on file  . Stress: Not on file  Relationships  . Social connections:    Talks on phone: Not on file    Gets together: Not on file    Attends religious service: Not on file    Active member of club or organization: Not on file    Attends meetings of clubs or organizations: Not on  file    Relationship status: Not on file  Other Topics Concern  . Not on file  Social History Narrative  . Not on file   Additional Social History:    Pain Medications: See PTA medication list Prescriptions: See PTA medication list Over the Counter: See PTA medication list History of alcohol / drug use?: No history of alcohol / drug abuse(Pt denies.)                    Sleep: Good  Appetite:  Good  Current Medications: Current Facility-Administered Medications  Medication Dose Route Frequency Provider Last Rate Last Dose  . amLODipine (NORVASC) tablet 10 mg  10 mg Oral Daily Laverle Hobby, PA-C   10 mg at 03/21/19 8563  . benztropine (COGENTIN) tablet 0.5 mg  0.5 mg Oral BID Johnn Hai, MD      . clonazePAM Bobbye [place]) tablet 2 mg  2 mg Oral Once Johnn Hai, MD      . haloperidol (HALDOL) 2 MG/ML solution 5 mg  5 mg Oral TID Johnn Hai, MD      . haloperidol (HALDOL)  tablet 5 mg  5 mg Oral Q8H PRN Derrill Center, NP       Or  . haloperidol lactate (HALDOL) injection 5 mg  5 mg Intramuscular Q8H PRN Derrill Center, NP   5 mg at 03/20/19 0156  . haloperidol lactate (HALDOL) injection 5 mg  5 mg Intramuscular TID Johnn Hai, MD   5 mg at 03/21/19 1497  . hydrOXYzine (ATARAX/VISTARIL) tablet 25 mg  25 mg Oral TID PRN Laverle Hobby, PA-C      . lithium carbonate capsule 300 mg  300 mg Oral BID WC Patriciaann Clan E, PA-C   300 mg at 03/21/19 0263  . LORazepam (ATIVAN) tablet 1 mg  1 mg Oral Q6H PRN Derrill Center, NP       Or  . LORazepam (ATIVAN) injection 1 mg  1 mg Intramuscular Q6H PRN Derrill Center, NP   1 mg at 03/20/19 0156  . LORazepam (ATIVAN) injection 4 mg  4 mg Intramuscular Once Johnn Hai, MD      . metoprolol tartrate (LOPRESSOR) tablet 25 mg  25 mg Oral Daily Johnn Hai, MD      . temazepam (RESTORIL) capsule 30 mg  30 mg Oral QHS Hampton Abbot, MD      . vortioxetine HBr (TRINTELLIX) tablet 5 mg  5 mg Oral Daily Laverle Hobby, PA-C   5 mg at 03/19/19 0745    Lab Results: No results found for this or any previous visit (from the past 50 hour(s)).  Blood Alcohol level:  Lab Results  Component Value Date   ETH <10 10/31/2018   ETH <5 78/58/8502    Metabolic Disorder Labs: Lab Results  Component Value Date   HGBA1C 5.6 03/14/2019   MPG 114.02 03/14/2019   MPG 108.28 11/03/2018   Lab Results  Component Value Date   PROLACTIN 11.0 03/14/2019   Lab Results  Component Value Date   CHOL 152 03/14/2019   TRIG 120 03/14/2019   HDL 50 03/14/2019   CHOLHDL 3.0 03/14/2019   VLDL 24 03/14/2019   LDLCALC 78 03/14/2019   LDLCALC 76 11/03/2018    Physical Findings: AIMS: Facial and Oral Movements Muscles of Facial Expression: None, normal Lips and Perioral Area: None, normal Jaw: None, normal Tongue: None, normal,Extremity Movements Upper (arms, wrists, hands, fingers): None, normal Lower (legs, knees,  ankles, toes):  None, normal, Trunk Movements Neck, shoulders, hips: None, normal, Overall Severity Severity of abnormal movements (highest score from questions above): None, normal Incapacitation due to abnormal movements: None, normal Patient's awareness of abnormal movements (rate only patient's report): No Awareness, Dental Status Current problems with teeth and/or dentures?: No Does patient usually wear dentures?: No  CIWA:  CIWA-Ar Total: 0 COWS:  COWS Total Score: 2  Musculoskeletal: Strength & Muscle Tone: within normal limits Gait & Station: normal Patient leans: N/A  Psychiatric Specialty Exam: Physical Exam  ROS  Blood pressure (!) 152/110, pulse (!) 102, temperature 99.3 F (37.4 C), temperature source Oral, resp. rate 20, SpO2 93 %.There is no height or weight on file to calculate BMI.  General Appearance: Disheveled  Eye Contact:  Good  Speech:  Clear and Coherent  Volume:  Increased  Mood:  hypomania  Affect:  Congruent  Thought Process:  Irrelevant and Descriptions of Associations: Tangential  Orientation:  Full (Time, Place, and Person)  Thought Content:  Illogical and Delusions  Suicidal Thoughts:  No  Homicidal Thoughts:  No  Memory:  Immediate;   Fair  Judgement:  Impaired  Insight:  lacks  Psychomotor Activity:  Normal  Concentration:  Concentration: Fair  Recall:  [employer] of Knowledge:  Fair  Language:  Fair spends a great deal of time on the phone with any number of agencies in a rambling fashion discussing things that are simply not clear that I can overhear  Akathisia:  Negative  Handed:  Right  AIMS (if indicated):     Assets:  Resilience Social Support  ADL's:  Intact  Cognition:  WNL  Sleep:  Number of Hours: 6.25     Treatment Plan Summary: Daily contact with patient to assess and evaluate symptoms and progress in treatment, Medication management and Plan Continue to force meds if she continues to refuse oral antipsychotics that are needed for mood  stabilization and psychosis  Daivon Rayos, MD 03/21/2019, 10:21 AM

## 2019-03-22 MED ORDER — HALOPERIDOL LACTATE 5 MG/ML IJ SOLN
5.0000 mg | Freq: Two times a day (BID) | INTRAMUSCULAR | Status: DC
  Filled 2019-03-22 (×4): qty 1

## 2019-03-22 MED ORDER — BENZTROPINE MESYLATE 1 MG PO TABS
1.0000 mg | ORAL_TABLET | Freq: Two times a day (BID) | ORAL | Status: DC
  Administered 2019-03-23 – 2019-03-29 (×13): 1 mg via ORAL
  Filled 2019-03-22 (×17): qty 1

## 2019-03-22 MED ORDER — HALOPERIDOL 5 MG PO TABS
7.5000 mg | ORAL_TABLET | Freq: Two times a day (BID) | ORAL | Status: DC
  Administered 2019-03-22: 7.5 mg via ORAL
  Filled 2019-03-22 (×4): qty 1.5

## 2019-03-22 NOTE — Progress Notes (Signed)
   03/22/19 0752  COVID-19 Daily Checkoff  Have you had a fever (temp > 37.80C/100F)  in the past 24 hours?  No  If you have had runny nose, nasal congestion, sneezing in the past 24 hours, has it worsened? No  COVID-19 EXPOSURE  Have you traveled outside the state in the past 14 days? No  Have you been in contact with someone with a confirmed diagnosis of COVID-19 or PUI in the past 14 days without wearing appropriate PPE? No  Have you been living in the same home as a person with confirmed diagnosis of COVID-19 or a PUI (household contact)? No  Have you been diagnosed with COVID-19? No

## 2019-03-22 NOTE — Plan of Care (Signed)
D: Patient presents agitated, restless. She slept about 1.5 hr last night and reports "I slept great." She has minimal awareness of illness. Patient denies SI/HI/AVH. She is tremulous, and it could be EPS from her haldol, since she is not compliant with medications. I did convince her to take her Cogentin, and she took it. Forced meds were given IM as patient refused PO. She is paranoid and avoided assessment questions. She appears to be responding to internal stimuli and she continues to be argumentative and agitated.  A: Patient checked q15 min, and checks reviewed. Reviewed medication changes with patient and educated on side effects. Educated patient on importance of attending group therapy sessions and educated on several coping skills. Encouarged participation in milieu through recreation therapy and attending meals with peers. Support and encouragement provided. Fluids offered. R: Patient resistant to medication/non-compliant. Patient contracts for safety on the unit.  Problem: Education: Goal: Emotional status will improve Outcome: Not Progressing Goal: Verbalization of understanding the information provided will improve Outcome: Not Progressing   Problem: Coping: Goal: Ability to verbalize frustrations and anger appropriately will improve Outcome: Not Progressing Goal: Ability to demonstrate self-control will improve Outcome: Not Progressing

## 2019-03-22 NOTE — Progress Notes (Signed)
D: Pt denies SI/HI/AVH. Pt continues to be intrusive . Pt confrontational at times. Pt visible on the unit this evening  A: Pt was offered support and encouragement.  Pt was encourage to attend groups. Q 15 minute checks were done for safety.   R: safety maintained on unit.   Problem: Safety: Goal: Ability to redirect hostility and anger into socially appropriate behaviors will improve Outcome: Progressing   Problem: Safety: Goal: Ability to demonstrate self-control will improve Outcome: Progressing

## 2019-03-22 NOTE — Progress Notes (Signed)
Adult Psychoeducational Group Note  Date:  03/22/2019 Time:  11:48 PM  Group Topic/Focus:  Wrap-Up Group:   The focus of this group is to help patients review their daily goal of treatment and discuss progress on daily workbooks.  Participation Level:  Active  Participation Quality:  Appropriate  Affect:  Appropriate  Cognitive:  Appropriate  Insight: Appropriate  Engagement in Group:  Improving  Modes of Intervention:  Discussion  Additional Comments: Pt stated her goal for today was to reach out to her support system and updated them. Pt stated she accomplished her goal today. Pt rated her over day a 5 out of 10. Pt stated positive interaction with peers today help improve her over all day.  [name] 03/22/2019, 11:48 PM

## 2019-03-22 NOTE — Progress Notes (Signed)
Patient ID: Paula Dickson, female   DOB: December 06, 1965, 53 y.o.   MRN: [id number]   NOVEL CORONAVIRUS (COVID-19) DAILY CHECK-OFF SYMPTOMS - answer yes or no to each - every day NO YES  Have you had a fever in the past 24 hours?  . Fever (Temp > 37.80C / 100F) X   Have you had any of these symptoms in the past 24 hours? . New Cough .  Sore Throat  .  Shortness of Breath .  Difficulty Breathing .  Unexplained Body Aches   X   Have you had any one of these symptoms in the past 24 hours not related to allergies?   . Runny Nose .  Nasal Congestion .  Sneezing   X   If you have had runny nose, nasal congestion, sneezing in the past 24 hours, has it worsened?  X   EXPOSURES - check yes or no X   Have you traveled outside the state in the past 14 days?  X   Have you been in contact with someone with a confirmed diagnosis of COVID-19 or PUI in the past 14 days without wearing appropriate PPE?  X   Have you been living in the same home as a person with confirmed diagnosis of COVID-19 or a PUI (household contact)?    X   Have you been diagnosed with COVID-19?    X              What to do next: Answered NO to all: Answered YES to anything:   Proceed with unit schedule Follow the [facility] Inpatient Flowsheet.

## 2019-03-22 NOTE — [facility] Group Notes (Signed)
  [facility] LCSW Group Therapy Note  Date/Time:  03/22/2019 11:15AM-12:00PM  Type of Therapy and Topic:  Group Therapy:  Feelings About Hospitalization  Participation Level:  Active   Description of Group This process group involved patients discussing their feelings related to being hospitalized, as well as the benefits they see to being in the hospital.  These feelings and benefits were itemized.  The group then brainstormed specific ways in which they could seek those same benefits when they discharge and return home.  Therapeutic Goals 1. Patient will identify and describe positive and negative feelings related to hospitalization 2. Patient will verbalize benefits of hospitalization to themselves personally 3. Patients will brainstorm together ways they can obtain similar benefits in the outpatient setting, identify barriers to wellness and possible solutions  Summary of Patient Progress:  The patient expressed her primary feelings about being hospitalized are negative, adding "who wants to be in a hospital?"  She remained delusional and disorganized throughout group and it was not possible to redirect her.  Her fingers were moving involuntarily throughout group.  Therapeutic Modalities Cognitive Behavioral Therapy Motivational Interviewing    Selmer Dominion, LCSW 03/22/2019, 1:18 PM

## 2019-03-22 NOTE — Progress Notes (Signed)
[facility] MD Progress Note  03/22/2019 8:36 AM Venessa Wickham Nosbisch  MRN:  [id number] Subjective:  Patient is a 53 year old female with a past psychiatric history significant for bipolar disorder, most recently manic, severe with psychotic features versus seasonal affective disorder; bipolar type.  Objective: Patient is seen and examined.  Patient is a 53 year old female with a past psychiatric history significant for bipolar disorder; most recently manic, severe with psychotic features versus schizoaffective disorder; bipolar type.  She is seen in follow-up.  He remains very disorganized, very paranoid.  We discussed her refusal of taking medications, and she stated "I take all the medicines I am supposed to take".  Nursing notes reflect that she had not taken her blood pressure medications previously, and the dosage was increased recently.  Her blood pressure continues to be elevated.  She was placed on the forced medication protocol yesterday, and was given haloperidol.  Review of the electronic medical record found that when she had received paliperidone and the long-acting paliperidone injection on a previous hospitalization she became essentially mute.  She remains paranoid, agitated, tremulous.  Nursing stated that she has taken her blood pressure medication this morning.  She slept less than 2 hours last night.  Her blood pressure this a.m. is at 147/99, pulse is 92, she is afebrile.  Review of her laboratories revealed a mildly elevated creatinine at 1.24, a lithium level of 0.43, prolactin of 11, a hemoglobin A1c of 5.6, and a TSH of 0.63.  Principal Problem: Bipolar I disorder, most recent episode manic, severe with psychotic features ([place]) Diagnosis: Principal Problem:   Bipolar I disorder, most recent episode manic, severe with psychotic features ([place]) Active Problems:   Schizoaffective disorder ([place])   Severe mixed bipolar 1 disorder without psychosis ([place])  Total Time spent with patient: 20  minutes  Past Psychiatric History: See admission H&P  Past Medical History:  Past Medical History:  Diagnosis Date  . Bipolar affective disorder ([place])   . History of arthritis   . History of chicken pox   . History of depression   . History of genital warts   . history of heart murmur   . History of high blood pressure   . History of thyroid disease   . History of UTI   . Hypertension   . Low TSH level 07/13/2017  . Schizophrenia [facility])     Past Surgical History:  Procedure Laterality Date  . ABLATION ON ENDOMETRIOSIS    . CYST REMOVAL NECK     around 11 years ago /benign  . MULTIPLE TOOTH EXTRACTIONS     Family History:  Family History  Problem Relation Age of Onset  . Arthritis Father   . Hyperlipidemia Father   . High blood pressure Father   . Diabetes Sister   . Diabetes Mother   . Diabetes Brother   . Mental illness Brother   . Alcohol abuse Paternal Uncle   . Alcohol abuse Paternal Grandfather   . Breast cancer Maternal Aunt   . Breast cancer Paternal Aunt   . High blood pressure Sister   . Mental illness Other        runs in family   Family Psychiatric  History: See admission H&P Social History:  Social History   Substance and Sexual Activity  Alcohol Use No     Social History   Substance and Sexual Activity  Drug Use No    Social History   Socioeconomic History  . Marital status: Single  Spouse name: Not on file  . Number of children: Not on file  . Years of education: Not on file  . Highest education level: Not on file  Occupational History  . Not on file  Social Needs  . Financial resource strain: Not on file  . Food insecurity:    Worry: Not on file    Inability: Not on file  . Transportation needs:    Medical: Not on file    Non-medical: Not on file  Tobacco Use  . Smoking status: Never Smoker  . Smokeless tobacco: Never Used  Substance and Sexual Activity  . Alcohol use: No  . Drug use: No  . Sexual activity: Not Currently   Lifestyle  . Physical activity:    Days per week: Not on file    Minutes per session: Not on file  . Stress: Not on file  Relationships  . Social connections:    Talks on phone: Not on file    Gets together: Not on file    Attends religious service: Not on file    Active member of club or organization: Not on file    Attends meetings of clubs or organizations: Not on file    Relationship status: Not on file  Other Topics Concern  . Not on file  Social History Narrative  . Not on file   Additional Social History:    Pain Medications: See PTA medication list Prescriptions: See PTA medication list Over the Counter: See PTA medication list History of alcohol / drug use?: No history of alcohol / drug abuse(Pt denies.)                    Sleep: Poor  Appetite:  Fair  Current Medications: Current Facility-Administered Medications  Medication Dose Route Frequency Provider Last Rate Last Dose  . amLODipine (NORVASC) tablet 10 mg  10 mg Oral Daily Laverle Hobby, PA-C   10 mg at 03/22/19 2505  . benztropine (COGENTIN) tablet 0.5 mg  0.5 mg Oral BID Johnn Hai, MD      . clonazePAM Bobbye [place]) tablet 2 mg  2 mg Oral Once Johnn Hai, MD      . haloperidol (HALDOL) 2 MG/ML solution 5 mg  5 mg Oral TID Johnn Hai, MD      . haloperidol (HALDOL) tablet 5 mg  5 mg Oral Q8H PRN Derrill Center, NP       Or  . haloperidol lactate (HALDOL) injection 5 mg  5 mg Intramuscular Q8H PRN Derrill Center, NP   5 mg at 03/20/19 0156  . haloperidol lactate (HALDOL) injection 5 mg  5 mg Intramuscular TID Johnn Hai, MD   5 mg at 03/22/19 0753  . hydrOXYzine (ATARAX/VISTARIL) tablet 25 mg  25 mg Oral TID PRN Laverle Hobby, PA-C      . lithium carbonate capsule 300 mg  300 mg Oral BID WC Patriciaann Clan E, PA-C   300 mg at 03/22/19 0752  . LORazepam (ATIVAN) tablet 1 mg  1 mg Oral Q6H PRN Derrill Center, NP       Or  . LORazepam (ATIVAN) injection 1 mg  1 mg Intramuscular Q6H PRN  Derrill Center, NP   1 mg at 03/20/19 0156  . LORazepam (ATIVAN) injection 4 mg  4 mg Intramuscular Once Johnn Hai, MD      . metoprolol tartrate (LOPRESSOR) tablet 50 mg  50 mg Oral BID Johnn Hai, MD      .  temazepam (RESTORIL) capsule 30 mg  30 mg Oral QHS Hampton Abbot, MD      . vortioxetine HBr (TRINTELLIX) tablet 5 mg  5 mg Oral Daily Laverle Hobby, PA-C   5 mg at 03/19/19 0745    Lab Results: No results found for this or any previous visit (from the past 48 hour(s)).  Blood Alcohol level:  Lab Results  Component Value Date   ETH <10 10/31/2018   ETH <5 48/88/9169    Metabolic Disorder Labs: Lab Results  Component Value Date   HGBA1C 5.6 03/14/2019   MPG 114.02 03/14/2019   MPG 108.28 11/03/2018   Lab Results  Component Value Date   PROLACTIN 11.0 03/14/2019   Lab Results  Component Value Date   CHOL 152 03/14/2019   TRIG 120 03/14/2019   HDL 50 03/14/2019   CHOLHDL 3.0 03/14/2019   VLDL 24 03/14/2019   LDLCALC 78 03/14/2019   LDLCALC 76 11/03/2018    Physical Findings: AIMS: Facial and Oral Movements Muscles of Facial Expression: None, normal Lips and Perioral Area: None, normal Jaw: None, normal Tongue: None, normal,Extremity Movements Upper (arms, wrists, hands, fingers): None, normal Lower (legs, knees, ankles, toes): None, normal, Trunk Movements Neck, shoulders, hips: None, normal, Overall Severity Severity of abnormal movements (highest score from questions above): None, normal Incapacitation due to abnormal movements: None, normal Patient's awareness of abnormal movements (rate only patient's report): No Awareness, Dental Status Current problems with teeth and/or dentures?: No Does patient usually wear dentures?: No  CIWA:  CIWA-Ar Total: 0 COWS:  COWS Total Score: 2  Musculoskeletal: Strength & Muscle Tone: within normal limits Gait & Station: normal Patient leans: N/A  Psychiatric Specialty Exam: Physical Exam  Nursing note and  vitals reviewed. Constitutional: She is oriented to person, place, and time. She appears well-developed and well-nourished.  HENT:  Head: Normocephalic and atraumatic.  Respiratory: Effort normal.  Neurological: She is alert and oriented to person, place, and time.    ROS  Blood pressure (!) 147/99, pulse 92, temperature 98.6 F (37 C), temperature source Oral, resp. rate 18, SpO2 93 %.There is no height or weight on file to calculate BMI.  General Appearance: Disheveled  Eye Contact:  Fair  Speech:  Normal Rate  Volume:  Normal  Mood:  Anxious, Dysphoric and Irritable  Affect:  Constricted  Thought Process:  Disorganized and Descriptions of Associations: Loose  Orientation:  Full (Time, Place, and Person)  Thought Content:  Delusions, Paranoid Ideation and Rumination  Suicidal Thoughts:  No  Homicidal Thoughts:  No  Memory:  Immediate;   Fair Recent;   Fair Remote;   Fair  Judgement:  Impaired  Insight:  Lacking  Psychomotor Activity:  Increased  Concentration:  Concentration: Fair and Attention Span: Fair  Recall:  [employer] of Knowledge:  Fair  Language:  Fair  Akathisia:  Yes  Handed:  Right  AIMS (if indicated):     Assets:  Resilience  ADL's:  Intact  Cognition:  WNL  Sleep:  Number of Hours: 1.25     Treatment Plan Summary: Daily contact with patient to assess and evaluate symptoms and progress in treatment, Medication management and Plan : Patient is seen and examined.  Patient is a 53 year old female with the above-stated past psychiatric history who is seen in follow-up.   Diagnosis: #1 schizoaffective disorder; bipolar type, #2 hypertension, #3 tremor, #4 diet-controlled diabetes mellitus, #5 extraparametal side effects  Patient remains significantly paranoid and agitated.  She  did receive Haldol 5 mg p.o. or IM yesterday, and she has had problems in the past with other second generation antipsychotics.  I am going to place her on 7.5 mg of Haldol p.o. or IM  twice daily under forced med protocol.  She has refused her Cogentin so far, but hopefully once we get things a little bit more scheduled maybe we can get her to take her medications.  Otherwise no other changes in her medications at this time. 1.  Order Haldol 7.5 mg IM or p.o. twice daily for psychosis. 2.  Continue lithium carbonate 300 mg p.o. twice daily for mood stability. 3.  Continue amlodipine and metoprolol for hypertension. 4.  Encourage patient to take Cogentin 0.5 mg p.o. twice daily for side effects of medications. 5.  Continue PRN lorazepam for agitation and anxiety. 6.  Continue temazepam 30 mg p.o. nightly for insomnia. 7.  Continue Trintellix 5 mg p.o. daily for anxiety and mood. 8.  Once patient is more cooperative recheck electrolytes and lithium level. 9.  Disposition planning-in progress.  Sharma Covert, MD 03/22/2019, 8:36 AM

## 2019-03-23 MED ORDER — HALOPERIDOL LACTATE 5 MG/ML IJ SOLN
10.0000 mg | Freq: Two times a day (BID) | INTRAMUSCULAR | Status: DC
  Filled 2019-03-23 (×23): qty 2

## 2019-03-23 MED ORDER — HALOPERIDOL LACTATE 5 MG/ML IJ SOLN: 5.0000 mg | Freq: Two times a day (BID) | INTRAMUSCULAR | Status: DC

## 2019-03-23 MED ORDER — CLONIDINE HCL 0.1 MG PO TABS: 0.1000 mg | ORAL_TABLET | Freq: Three times a day (TID) | ORAL | Status: DC | PRN

## 2019-03-23 MED ORDER — CARVEDILOL 6.25 MG PO TABS
6.2500 mg | ORAL_TABLET | Freq: Two times a day (BID) | ORAL | Status: DC
  Administered 2019-03-23 – 2019-03-24 (×3): 6.25 mg via ORAL
  Filled 2019-03-23: qty 2
  Filled 2019-03-23 (×5): qty 1

## 2019-03-23 MED ORDER — RAMIPRIL 2.5 MG PO CAPS
2.5000 mg | ORAL_CAPSULE | Freq: Every day | ORAL | Status: DC
  Filled 2019-03-23 (×2): qty 1

## 2019-03-23 MED ORDER — LITHIUM CARBONATE 300 MG PO CAPS
600.0000 mg | ORAL_CAPSULE | Freq: Two times a day (BID) | ORAL | Status: DC
  Administered 2019-03-23 – 2019-03-24 (×3): 600 mg via ORAL
  Filled 2019-03-23 (×5): qty 2

## 2019-03-23 MED ORDER — HALOPERIDOL 5 MG PO TABS: 10.0000 mg | ORAL_TABLET | Freq: Two times a day (BID) | ORAL | Status: DC

## 2019-03-23 MED ORDER — METOPROLOL TARTRATE 100 MG PO TABS
100.0000 mg | ORAL_TABLET | Freq: Two times a day (BID) | ORAL | Status: DC
  Filled 2019-03-23 (×2): qty 1

## 2019-03-23 MED ORDER — HALOPERIDOL 5 MG PO TABS
10.0000 mg | ORAL_TABLET | Freq: Two times a day (BID) | ORAL | Status: DC
  Administered 2019-03-23 – 2019-04-01 (×19): 10 mg via ORAL
  Filled 2019-03-23 (×24): qty 2

## 2019-03-23 NOTE — Progress Notes (Signed)
Adult Psychoeducational Group Note  Date:  03/23/2019 Time:  9:12 PM  Group Topic/Focus:  Wrap-Up Group:   The focus of this group is to help patients review their daily goal of treatment and discuss progress on daily workbooks.  Participation Level:  Active  Participation Quality:  Appropriate  Affect:  Appropriate  Cognitive:  Appropriate  Insight: Appropriate  Engagement in Group:  Engaged  Modes of Intervention:  Discussion  Additional Comments:  Pt stated her goal for today was to talk with her doctor about her treatment plan and medication concerns. Pt stated she accomplished her goal today. Pt rated her over all day a 9 out of 10. Pt stated she attend all groups held today. Pt stated the high light of her day was, she was able to contact her support team.  [name] 03/23/2019, 9:12 PM

## 2019-03-23 NOTE — BHH Group Notes (Signed)
[place] LCSW Group Therapy Note  Date/Time:  03/23/2019  11:00AM-12:00PM  Type of Therapy and Topic:  Group Therapy:  Music and Mood  Participation Level:  Minimal   Description of Group: In this process group, members listened to a variety of genres of music and identified that different types of music evoke different responses.  Patients were encouraged to identify music that was soothing for them and music that was energizing for them.  Patients discussed how this knowledge can help with wellness and recovery in various ways including managing depression and anxiety as well as encouraging healthy sleep habits.    Therapeutic Goals: 1. Patients will explore the impact of different varieties of music on mood 2. Patients will verbalize the thoughts they have when listening to different types of music 3. Patients will identify music that is soothing to them as well as music that is energizing to them 4. Patients will discuss how to use this knowledge to assist in maintaining wellness and recovery 5. Patients will explore the use of music as a coping skill  Summary of Patient Progress:  At the beginning of group, patient was not present.  She came in briefly several times, danced and sang, then left again.  Therapeutic Modalities: Solution Focused Brief Therapy Activity   Selmer Dominion, LCSW

## 2019-03-23 NOTE — Progress Notes (Signed)
[facility] MD Progress Note  03/23/2019 8:48 AM Paula Dickson  MRN:  [id number] Subjective:  Patient is a 53 year old female with a past psychiatric history significant for bipolar disorder, most recently manic, severe with psychotic features versus seasonal affective disorder; bipolar type.  Objective: Patient is seen and examined.  Patient is a 53 year old female with a past psychiatric history significant for bipolar disorder; most recently manic, severe with psychotic features versus schizoaffective disorder; bipolar type.  She is seen in follow-up.  She remains significantly manic.  She only slept about an hour and a half last night.  She is hyper religious.  She is still pressured.  She is tangential.  The forced medication order yesterday was haloperidol 7.5 mg twice daily.  That appears not to have had any benefit at this point.  Her blood pressure remains elevated.  Her blood pressure this morning is 154/103, and is well little bit earlier 166/101.  She has a low-grade temperature this morning at 99.1.  Her heart rate is between 101 and 103.  Principal Problem: Bipolar I disorder, most recent episode manic, severe with psychotic features ([place]) Diagnosis: Principal Problem:   Bipolar I disorder, most recent episode manic, severe with psychotic features ([place]) Active Problems:   Schizoaffective disorder ([place])   Severe mixed bipolar 1 disorder without psychosis ([place])  Total Time spent with patient: 15 minutes  Past Psychiatric History: See admission H&P  Past Medical History:  Past Medical History:  Diagnosis Date  . Bipolar affective disorder ([place])   . History of arthritis   . History of chicken pox   . History of depression   . History of genital warts   . history of heart murmur   . History of high blood pressure   . History of thyroid disease   . History of UTI   . Hypertension   . Low TSH level 07/13/2017  . Schizophrenia [facility])     Past Surgical History:  Procedure  Laterality Date  . ABLATION ON ENDOMETRIOSIS    . CYST REMOVAL NECK     around 11 years ago /benign  . MULTIPLE TOOTH EXTRACTIONS     Family History:  Family History  Problem Relation Age of Onset  . Arthritis Father   . Hyperlipidemia Father   . High blood pressure Father   . Diabetes Sister   . Diabetes Mother   . Diabetes Brother   . Mental illness Brother   . Alcohol abuse Paternal Uncle   . Alcohol abuse Paternal Grandfather   . Breast cancer Maternal Aunt   . Breast cancer Paternal Aunt   . High blood pressure Sister   . Mental illness Other        runs in family   Family Psychiatric  History: See admission H&P Social History:  Social History   Substance and Sexual Activity  Alcohol Use No     Social History   Substance and Sexual Activity  Drug Use No    Social History   Socioeconomic History  . Marital status: Single    Spouse name: Not on file  . Number of children: Not on file  . Years of education: Not on file  . Highest education level: Not on file  Occupational History  . Not on file  Social Needs  . Financial resource strain: Not on file  . Food insecurity:    Worry: Not on file    Inability: Not on file  . Transportation needs:  Medical: Not on file    Non-medical: Not on file  Tobacco Use  . Smoking status: Never Smoker  . Smokeless tobacco: Never Used  Substance and Sexual Activity  . Alcohol use: No  . Drug use: No  . Sexual activity: Not Currently  Lifestyle  . Physical activity:    Days per week: Not on file    Minutes per session: Not on file  . Stress: Not on file  Relationships  . Social connections:    Talks on phone: Not on file    Gets together: Not on file    Attends religious service: Not on file    Active member of club or organization: Not on file    Attends meetings of clubs or organizations: Not on file    Relationship status: Not on file  Other Topics Concern  . Not on file  Social History Narrative  . Not  on file   Additional Social History:    Pain Medications: See PTA medication list Prescriptions: See PTA medication list Over the Counter: See PTA medication list History of alcohol / drug use?: No history of alcohol / drug abuse(Pt denies.)                    Sleep: Poor  Appetite:  Fair  Current Medications: Current Facility-Administered Medications  Medication Dose Route Frequency Provider Last Rate Last Dose  . benztropine (COGENTIN) tablet 1 mg  1 mg Oral BID Sharma Covert, MD      . clonazePAM Bobbye [place]) tablet 2 mg  2 mg Oral Once Johnn Hai, MD      . haloperidol lactate (HALDOL) injection 10 mg  10 mg Intramuscular BID Sharma Covert, MD       Or  . haloperidol (HALDOL) tablet 10 mg  10 mg Oral BID Sharma Covert, MD      . hydrOXYzine (ATARAX/VISTARIL) tablet 25 mg  25 mg Oral TID PRN Laverle Hobby, PA-C      . lithium carbonate capsule 600 mg  600 mg Oral BID WC Sharma Covert, MD      . LORazepam (ATIVAN) tablet 1 mg  1 mg Oral Q6H PRN Derrill Center, NP       Or  . LORazepam (ATIVAN) injection 1 mg  1 mg Intramuscular Q6H PRN Derrill Center, NP   1 mg at 03/20/19 0156  . LORazepam (ATIVAN) injection 4 mg  4 mg Intramuscular Once Johnn Hai, MD      . metoprolol tartrate (LOPRESSOR) tablet 100 mg  100 mg Oral BID Sharma Covert, MD      . ramipril (ALTACE) capsule 2.5 mg  2.5 mg Oral Daily Sharma Covert, MD      . temazepam (RESTORIL) capsule 30 mg  30 mg Oral QHS Hampton Abbot, MD      . vortioxetine HBr (TRINTELLIX) tablet 5 mg  5 mg Oral Daily Laverle Hobby, PA-C   5 mg at 03/19/19 0745    Lab Results: No results found for this or any previous visit (from the past 1 hour(s)).  Blood Alcohol level:  Lab Results  Component Value Date   [facility] <10 10/31/2018   ETH <5 58/52/7782    Metabolic Disorder Labs: Lab Results  Component Value Date   HGBA1C 5.6 03/14/2019   MPG 114.02 03/14/2019   MPG 108.28 11/03/2018    Lab Results  Component Value Date   PROLACTIN 11.0 03/14/2019  Lab Results  Component Value Date   CHOL 152 03/14/2019   TRIG 120 03/14/2019   HDL 50 03/14/2019   CHOLHDL 3.0 03/14/2019   VLDL 24 03/14/2019   LDLCALC 78 03/14/2019   LDLCALC 76 11/03/2018    Physical Findings: AIMS: Facial and Oral Movements Muscles of Facial Expression: None, normal Lips and Perioral Area: None, normal Jaw: None, normal Tongue: None, normal,Extremity Movements Upper (arms, wrists, hands, fingers): None, normal Lower (legs, knees, ankles, toes): None, normal, Trunk Movements Neck, shoulders, hips: None, normal, Overall Severity Severity of abnormal movements (highest score from questions above): None, normal Incapacitation due to abnormal movements: None, normal Patient's awareness of abnormal movements (rate only patient's report): No Awareness, Dental Status Current problems with teeth and/or dentures?: No Does patient usually wear dentures?: No  CIWA:  CIWA-Ar Total: 0 COWS:  COWS Total Score: 3  Musculoskeletal: Strength & Muscle Tone: within normal limits Gait & Station: normal Patient leans: N/A  Psychiatric Specialty Exam: Physical Exam  Nursing note and vitals reviewed. Constitutional: She appears well-developed and well-nourished.  HENT:  Head: Normocephalic and atraumatic.  Respiratory: Effort normal.  Neurological: She is alert.    ROS  Blood pressure (!) 154/103, pulse (!) 103, temperature 99.1 F (37.3 C), temperature source Oral, resp. rate 20, SpO2 93 %.There is no height or weight on file to calculate BMI.  General Appearance: Disheveled  Eye Contact:  Good  Speech:  Pressured  Volume:  Increased  Mood:  Dysphoric, Euphoric and Irritable  Affect:  Labile  Thought Process:  Goal Directed and Descriptions of Associations: Tangential  Orientation:  Full (Time, Place, and Person)  Thought Content:  Delusions, Paranoid Ideation and Tangential  Suicidal  Thoughts:  No  Homicidal Thoughts:  No  Memory:  Immediate;   Fair Recent;   Fair Remote;   Fair  Judgement:  Impaired  Insight:  Lacking  Psychomotor Activity:  Increased  Concentration:  Concentration: Poor and Attention Span: Poor  Recall:  Poor  Fund of Knowledge:  Fair  Language:  Fair  Akathisia:  Negative  Handed:  Right  AIMS (if indicated):     Assets:  Desire for Improvement Resilience  ADL's:  Intact  Cognition:  WNL  Sleep:  Number of Hours: 1.75     Treatment Plan Summary: Daily contact with patient to assess and evaluate symptoms and progress in treatment, Medication management and Plan : Patient is seen and examined.  Patient is a 53 year old female with the above-stated past psychiatric history who is seen in follow-up.    Diagnosis: #1 schizoaffective disorder; bipolar type, #2 hypertension, #3 tremor, #4 diet-controlled diabetes mellitus, #5 extraparametal side effects  Patient remains significantly paranoid, hyper religious and agitated.  She is still tangential, pressured.  I am going to increase her haloperidol to 10 mg p.o. or IM twice daily.  I have also increased her Cogentin to 1 mg p.o. twice daily.  I have examined her for cogwheeling, and she was negative for this.  I have feeling her tremulousness is secondary to her bipolar disorder and not necessarily medication related.  Hopefully the increase in a haloperidol will have benefit to slow her down.  As well, I am going to increase her lithium carbonate dosage to 600 mg p.o. twice daily.  I have written for a metabolic panel given her elevated creatinine.  We will also recheck her TSH.  Hopefully she will take the lithium.  Her blood pressure remains elevated today, and the amlodipine  does not appear to be of any benefit.  I am going to stop that as well as the metoprolol and add Coreg 6.25 mg p.o. twice daily.  That should decrease her blood pressure as well as slow down her heart rate.  The biggest issue with  all these problems are her illness and how it is affecting her noncompliance.  Hopefully the above changes to the Haldol and lithium will be of benefit to slow her down. 1.  Increase haloperidol to 10 mg IM or p.o. twice daily for psychosis. 2.  Increase lithium carbonate to 600 mg p.o. twice daily for mood stability. 3.  Stop amlodipine and metoprolol. 4.  Start Coreg 6.25 mg p.o. twice daily for hypertension and tachycardia. 5.  Continue lorazepam as needed for agitation and anxiety. 6.  Continue Cogentin 1 mg p.o. twice daily for side effects of medications. 7.  Increase temazepam to 45 mg p.o. nightly for insomnia. 8.  Stop Trintellix 9.  Check metabolic panel and TSH as well as lithium in a.m. 10.  Start clonidine 0.1 mg p.o. 3 times daily systolic blood pressure greater than 150. 11.  Disposition planning-in progress.  Sharma Covert, MD 03/23/2019, 8:48 AM

## 2019-03-23 NOTE — Progress Notes (Signed)
Writer spoke with patient 1:1 after observing her in her room looking through her workbook. She reports having had a good day and committed about the workbook having some good information in it. Writer inquired about her father and she reported that her sister is there taking care of him for now. She was complaint with her hs medication. She had spoken with Dominica Severin RN who advised her to start taking her medications and talk with her doctor about what she needs to do to help discharge. Writer encouraged her to try and get a good nights rest. Support given and safety maintained on unit with 15 min checks.

## 2019-03-23 NOTE — Progress Notes (Signed)
Pt Bp 166/101 sitting and 154/103 standing, pt given 50 mg Lopressor and 10 Norvasc early per [employer] . Pt stated she really slept and got rest even though pt only slept 1.75 hrs.

## 2019-03-23 NOTE — Progress Notes (Signed)
[place] NOVEL CORONAVIRUS (COVID-19) DAILY CHECK-OFF SYMPTOMS - answer yes or no to each - every day NO YES  Have you had a fever in the past 24 hours?  . Fever (Temp > 37.80C / 100F) X   Have you had any of these symptoms in the past 24 hours? . New Cough .  Sore Throat  .  Shortness of Breath .  Difficulty Breathing .  Unexplained Body Aches   X   Have you had any one of these symptoms in the past 24 hours not related to allergies?   . Runny Nose .  Nasal Congestion .  Sneezing   X   If you have had runny nose, nasal congestion, sneezing in the past 24 hours, has it worsened?  X   EXPOSURES - check yes or no X   Have you traveled outside the state in the past 14 days?  X   Have you been in contact with someone with a confirmed diagnosis of COVID-19 or PUI in the past 14 days without wearing appropriate PPE?  X   Have you been living in the same home as a person with confirmed diagnosis of COVID-19 or a PUI (household contact)?    X   Have you been diagnosed with COVID-19?    X              What to do next: Answered NO to all: Answered YES to anything:   Proceed with unit schedule Follow the [facility] Inpatient Flowsheet.   

## 2019-03-24 LAB — COMPREHENSIVE METABOLIC PANEL
ALT: 24 U/L (ref 0–44)
AST: 16 U/L (ref 15–41)
Albumin: 4 g/dL (ref 3.5–5.0)
Alkaline Phosphatase: 74 U/L (ref 38–126)
Anion gap: 7 (ref 5–15)
BUN: 21 mg/dL — ABNORMAL HIGH (ref 6–20)
CO2: 26 mmol/L (ref 22–32)
Calcium: 10 mg/dL (ref 8.9–10.3)
Chloride: 112 mmol/L — ABNORMAL HIGH (ref 98–111)
Creatinine, Ser: 1.04 mg/dL — ABNORMAL HIGH (ref 0.44–1.00)
GFR calc Af Amer: >60 mL/min (ref >60)
GFR calc non Af Amer: >60 mL/min (ref >60)
Glucose, Bld: 98 mg/dL (ref 70–99)
Potassium: 4 mmol/L (ref 3.5–5.1)
Sodium: 145 mmol/L (ref 135–145)
Total Bilirubin: 0.5 mg/dL (ref 0.3–1.2)
Total Protein: 6.8 g/dL (ref 6.5–8.1)

## 2019-03-24 LAB — TSH: TSH: 0.214 u[IU]/mL — ABNORMAL LOW (ref 0.350–4.500)

## 2019-03-24 LAB — LITHIUM LEVEL: Lithium Lvl: 0.49 mmol/L — ABNORMAL LOW (ref 0.60–1.20)

## 2019-03-24 MED ORDER — LITHIUM CARBONATE 300 MG PO CAPS
300.0000 mg | ORAL_CAPSULE | Freq: Two times a day (BID) | ORAL | Status: DC
  Administered 2019-03-24 – 2019-03-26 (×4): 300 mg via ORAL
  Filled 2019-03-24 (×7): qty 1

## 2019-03-24 MED ORDER — CARVEDILOL 12.5 MG PO TABS
12.5000 mg | ORAL_TABLET | Freq: Two times a day (BID) | ORAL | Status: DC
  Administered 2019-03-24 – 2019-04-01 (×16): 12.5 mg via ORAL
  Filled 2019-03-24 (×18): qty 1

## 2019-03-24 NOTE — Progress Notes (Signed)
Recreation Therapy Notes  Date: 4.27.20 Time: 1000 Location: 500 Hall Dayroom  Group Topic: Coping Skills  Goal Area(s) Addresses:  Patient will be able to identify positive coping strategies. Patient will be able to identify importance of using coping skills. Patient will identify benefits of using coping skills post d/c.  Behavioral Response: Engaged  Intervention: Worksheet  Activity: Coping Skills A to Z.  Patients were given a worksheet with the alphabet from A to Z.  Patients were to identify a coping skill for each letter of the alphabet. Patients would then share some of their coping skills with the group.  Education: Radiographer, therapeutic, Dentist.   Education Outcome: Acknowledges understanding/In group clarification offered/Needs additional education.   Clinical Observations/Feedback: Pt expressed coping skills could be positive or negative.  Pt also expressed people tend to use negative coping skills more because when things happen, they just react.  Pt identified some of her coping skills as abstinence, bible, call for help/healing, doing housework, faith, music, God, talk to a trusted friend, understanding, observe and love.    [name], LRT/CTRS    [name] A 03/24/2019 11:17 AM

## 2019-03-24 NOTE — Tx Team (Signed)
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 benztropine (COGENTIN) tablet 1 mg  1 mg Oral BID Sharma Covert, MD   1 mg at 03/24/19 6644  . carvedilol (COREG) tablet 6.25 mg  6.25 mg Oral BID WC Sharma Covert, MD   6.25 mg at 03/24/19 0810  . clonazePAM (KLONOPIN) tablet 2 mg  2 mg Oral Once Johnn Hai, MD      . cloNIDine (CATAPRES) tablet 0.1 mg  0.1 mg Oral TID PRN Sharma Covert, MD      . haloperidol lactate (HALDOL) injection 10 mg  10 mg Intramuscular BID Sharma Covert, MD       Or  . haloperidol (HALDOL) tablet 10 mg  10 mg Oral BID Sharma Covert, MD   10 mg at 03/24/19 0347  . hydrOXYzine (ATARAX/VISTARIL) tablet 25 mg  25 mg Oral TID PRN Laverle Hobby, PA-C      . lithium carbonate capsule 600 mg  600 mg Oral BID WC Sharma Covert, MD   600 mg at 03/24/19 0811  . LORazepam (ATIVAN) tablet 1 mg  1 mg Oral Q6H PRN Derrill Center, NP       Or  . LORazepam (ATIVAN) injection 1 mg  1 mg Intramuscular Q6H PRN Derrill Center, NP   1 mg at 03/20/19 0156  . LORazepam (ATIVAN) injection 4 mg  4 mg Intramuscular Once Johnn Hai, MD      . temazepam (RESTORIL) capsule 30 mg  30 mg Oral QHS Hampton Abbot, MD   30 mg at 03/23/19 2049   PTA Medications: Medications Prior to Admission  Medication Sig Dispense Refill Last Dose  . amLODipine (NORVASC) 10 MG tablet Take 1 tablet (10 mg total) by mouth daily.  For high blood pressure     . lithium carbonate 300 MG capsule Take 1 capsule (300 mg total) by mouth 2 (two) times daily with a meal. For mood stabilization 60 capsule 0   . OXcarbazepine (TRILEPTAL) 150 MG tablet Take 0.5 tablets (75 mg total) by mouth 2 (two) times daily. For mood stabilization 60 tablet 0   . propranolol ER (INDERAL LA) 80 MG 24 hr capsule Take 1 capsule (80 mg total) by mouth daily. For high blood pressure/anxiety 20 capsule 0   . QUEtiapine (SEROQUEL) 400 MG tablet Take 1 tablet (400 mg total) by mouth at bedtime. For mood control (Patient taking differently: Take 200 mg by mouth at bedtime. For mood control) 30 tablet 0   . vortioxetine HBr (TRINTELLIX) 5 MG TABS tablet Take 1 tablet (5 mg total) by mouth daily. For depression 30 tablet 0     Patient Stressors: Marital or family conflict Medication change or noncompliance  Patient Strengths: Average or above average intelligence Communication skills General fund of knowledge Physical Health  Treatment Modalities: Medication Management, Group therapy, Case management,  1 to 1 session with clinician, Psychoeducation, Recreational therapy.   Physician Treatment Plan for Primary Diagnosis: Bipolar I disorder, most recent episode manic,  severe with psychotic features ([place]) Long Term Goal(s): Improvement in symptoms so as ready for discharge Improvement in symptoms so as ready for discharge   Short Term Goals: Ability to identify changes in lifestyle to reduce recurrence of condition will improve Compliance with prescribed medications will improve  Medication Management: Evaluate patient's response, side effects, and tolerance of medication regimen.  Therapeutic Interventions: 1 to 1 sessions, Unit Group sessions and Medication administration.  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  Therapeutic Interventions: 1 to 1 sessions, Unit Group sessions and Medication administration.  Evaluation of Outcomes: Progressing   RN Treatment Plan for Primary Diagnosis: Bipolar I disorder, most recent episode manic, severe with psychotic features ([place]) Long Term Goal(s): Knowledge of disease and therapeutic regimen to maintain health will improve  Short Term Goals: Ability to identify and develop effective coping behaviors will improve and Compliance with prescribed medications will improve  Medication Management: RN will administer medications as ordered by provider, will assess and evaluate patient's response and provide education to patient for prescribed medication. RN will report any adverse and/or side effects to prescribing provider.  Therapeutic Interventions: 1 on 1 counseling sessions, Psychoeducation, Medication administration, Evaluate responses to treatment, Monitor vital signs and CBGs as ordered, Perform/monitor CIWA, COWS, AIMS and Fall Risk screenings as ordered, Perform wound care treatments as ordered.  Evaluation of Outcomes: Progressing   LCSW Treatment Plan for Primary Diagnosis: Bipolar I disorder, most recent episode manic, severe with psychotic features ([place]) Long Term Goal(s): Safe transition to appropriate next level of care at discharge, Engage patient in therapeutic group addressing interpersonal concerns.  Short Term Goals: Engage  patient in aftercare planning with referrals and resources, Increase social support and Increase skills for wellness and recovery  Therapeutic Interventions: Assess for all discharge needs, 1 to 1 time with Social worker, Explore available resources and support systems, Assess for adequacy in community support network, Educate family and significant other(s) on suicide prevention, Complete Psychosocial Assessment, Interpersonal group therapy.  Evaluation of Outcomes: Progressing   Progress in Treatment: Attending groups: Yes. Participating in groups: Yes. Taking medication as prescribed: Yes. Toleration medication: Yes. Family/Significant other contact made: No, will contact:  pt declined consent Patient understands diagnosis: No. Discussing patient identified problems/goals with staff: Yes. Medical problems stabilized or resolved: Yes. Denies suicidal/homicidal ideation: Yes. Issues/concerns per patient self-inventory: No. Other: none  New problem(s) identified: No, Describe:  none  New Short Term/Long Term Goal(s):  Patient Goals:  "get my sister to do what I want her to do"  Discharge Plan or Barriers:   Reason for Continuation of Hospitalization: Delusions  Medication stabilization  Estimated Length of Stay:1-3 days.  Attendees: Patient: 03/24/2019   Physician: Dr. Jake Samples, MD 03/24/2019   Nursing: Sena Hitch, RN 03/24/2019   RN Care Manager: 03/24/2019   Social Worker: Lurline Idol, LCSW 03/24/2019   Recreational Therapist:  03/24/2019   Other:  03/24/2019   Other:  03/24/2019   Other: 03/24/2019        Scribe for Treatment Team: Joanne Chars, LCSW 03/24/2019 11:00 AM

## 2019-03-24 NOTE — Progress Notes (Addendum)
[facility] MD Progress Note  03/24/2019 1:03 PM [place]  MRN:  [id number] Subjective:    Patient is a time showing a little bit more insight she has a little bit of parkinsonian type tremor obviously from her medications she states she does want taking thing for it she is also quite passive-aggressive today she keeps insisting on seeing me for more questions and when I bring her into the room she states she does not want to talk right now so she is being very gamy at this point.  Principal Problem: Bipolar I disorder, most recent episode manic, severe with psychotic features ([place]) Diagnosis: Principal Problem:   Bipolar I disorder, most recent episode manic, severe with psychotic features ([place]) Active Problems:   Schizoaffective disorder ([place])   Severe mixed bipolar 1 disorder without psychosis ([place])  Total Time spent with patient: 20 minutes  Past Medical History:  Past Medical History:  Diagnosis Date  . Bipolar affective disorder ([place])   . History of arthritis   . History of chicken pox   . History of depression   . History of genital warts   . history of heart murmur   . History of high blood pressure   . History of thyroid disease   . History of UTI   . Hypertension   . Low TSH level 07/13/2017  . Schizophrenia [facility])     Past Surgical History:  Procedure Laterality Date  . ABLATION ON ENDOMETRIOSIS    . CYST REMOVAL NECK     around 11 years ago /benign  . MULTIPLE TOOTH EXTRACTIONS     Family History:  Family History  Problem Relation Age of Onset  . Arthritis Father   . Hyperlipidemia Father   . High blood pressure Father   . Diabetes Sister   . Diabetes Mother   . Diabetes Brother   . Mental illness Brother   . Alcohol abuse Paternal Uncle   . Alcohol abuse Paternal Grandfather   . Breast cancer Maternal Aunt   . Breast cancer Paternal Aunt   . High blood pressure Sister   . Mental illness Other        runs in family   Family Psychiatric  History:  extensive Social History:  Social History   Substance and Sexual Activity  Alcohol Use No     Social History   Substance and Sexual Activity  Drug Use No    Social History   Socioeconomic History  . Marital status: Single    Spouse name: Not on file  . Number of children: Not on file  . Years of education: Not on file  . Highest education level: Not on file  Occupational History  . Not on file  Social Needs  . Financial resource strain: Not on file  . Food insecurity:    Worry: Not on file    Inability: Not on file  . Transportation needs:    Medical: Not on file    Non-medical: Not on file  Tobacco Use  . Smoking status: Never Smoker  . Smokeless tobacco: Never Used  Substance and Sexual Activity  . Alcohol use: No  . Drug use: No  . Sexual activity: Not Currently  Lifestyle  . Physical activity:    Days per week: Not on file    Minutes per session: Not on file  . Stress: Not on file  Relationships  . Social connections:    Talks on phone: Not on file  Gets together: Not on file    Attends religious service: Not on file    Active member of club or organization: Not on file    Attends meetings of clubs or organizations: Not on file    Relationship status: Not on file  Other Topics Concern  . Not on file  Social History Narrative  . Not on file   Additional Social History:    Pain Medications: See PTA medication list Prescriptions: See PTA medication list Over the Counter: See PTA medication list History of alcohol / drug use?: No history of alcohol / drug abuse(Pt denies.)                    Sleep: Good  Appetite:  Good  Current Medications: Current Facility-Administered Medications  Medication Dose Route Frequency Provider Last Rate Last Dose  . benztropine (COGENTIN) tablet 1 mg  1 mg Oral BID Sharma Covert, MD   1 mg at 03/24/19 4098  . carvedilol (COREG) tablet 12.5 mg  12.5 mg Oral BID WC Sharma Covert, MD      .  clonazePAM [place]) tablet 2 mg  2 mg Oral Once Johnn Hai, MD      . cloNIDine (CATAPRES) tablet 0.1 mg  0.1 mg Oral TID PRN Sharma Covert, MD      . haloperidol lactate (HALDOL) injection 10 mg  10 mg Intramuscular BID Sharma Covert, MD       Or  . haloperidol (HALDOL) tablet 10 mg  10 mg Oral BID Sharma Covert, MD   10 mg at 03/24/19 1191  . hydrOXYzine (ATARAX/VISTARIL) tablet 25 mg  25 mg Oral TID PRN Laverle Hobby, PA-C      . lithium carbonate capsule 600 mg  600 mg Oral BID WC Sharma Covert, MD   600 mg at 03/24/19 4782  . LORazepam (ATIVAN) tablet 1 mg  1 mg Oral Q6H PRN Derrill Center, NP       Or  . LORazepam (ATIVAN) injection 1 mg  1 mg Intramuscular Q6H PRN Derrill Center, NP   1 mg at 03/20/19 0156  . LORazepam (ATIVAN) injection 4 mg  4 mg Intramuscular Once Johnn Hai, MD      . temazepam (RESTORIL) capsule 30 mg  30 mg Oral QHS Hampton Abbot, MD   30 mg at 03/23/19 2049    Lab Results:  Results for orders placed or performed during the hospital encounter of 03/13/19 (from the past 48 hour(s))  Comprehensive metabolic panel     Status: Abnormal   Collection Time: 03/24/19  6:39 AM  Result Value Ref Range   Sodium 145 135 - 145 mmol/L   Potassium 4.0 3.5 - 5.1 mmol/L   Chloride 112 (H) 98 - 111 mmol/L   CO2 26 22 - 32 mmol/L   Glucose, Bld 98 70 - 99 mg/dL   BUN 21 (H) 6 - 20 mg/dL   Creatinine, Ser 1.04 (H) 0.44 - 1.00 mg/dL   Calcium 10.0 8.9 - 10.3 mg/dL   Total Protein 6.8 6.5 - 8.1 g/dL   Albumin 4.0 3.5 - 5.0 g/dL   AST 16 15 - 41 U/L   ALT 24 0 - 44 U/L   Alkaline Phosphatase 74 38 - 126 U/L   Total Bilirubin 0.5 0.3 - 1.2 mg/dL   GFR calc non Af Amer >60 >60 mL/min   GFR calc Af Amer >60 >60 mL/min   Anion gap  7 5 - 15    Comment: Performed at [facility], [place] [address], [place], [place] [address]  Lithium level     Status: Abnormal   Collection Time: 03/24/19  6:39 AM  Result Value Ref Range    Lithium Lvl 0.49 (L) 0.60 - 1.20 mmol/L    Comment: Performed at [facility], [place] [address] [place], [place] [address]  TSH     Status: Abnormal   Collection Time: 03/24/19  6:39 AM  Result Value Ref Range   TSH 0.214 (L) 0.350 - 4.500 uIU/mL    Comment: Performed by a 3rd Generation assay with a functional sensitivity of <=0.01 uIU/mL. Performed at [facility], [place] [address], [place], [place] [address]     Blood Alcohol level:  Lab Results  Component Value Date   [facility] <10 10/31/2018   ETH <5 57/97/2820    Metabolic Disorder Labs: Lab Results  Component Value Date   HGBA1C 5.6 03/14/2019   MPG 114.02 03/14/2019   MPG 108.28 11/03/2018   Lab Results  Component Value Date   PROLACTIN 11.0 03/14/2019   Lab Results  Component Value Date   CHOL 152 03/14/2019   TRIG 120 03/14/2019   HDL 50 03/14/2019   CHOLHDL 3.0 03/14/2019   VLDL 24 03/14/2019   LDLCALC 78 03/14/2019   LDLCALC 76 11/03/2018    Physical Findings: AIMS: Facial and Oral Movements Muscles of Facial Expression: None, normal Lips and Perioral Area: None, normal Jaw: None, normal Tongue: None, normal,Extremity Movements Upper (arms, wrists, hands, fingers): None, normal Lower (legs, knees, ankles, toes): None, normal, Trunk Movements Neck, shoulders, hips: None, normal, Overall Severity Severity of abnormal movements (highest score from questions above): None, normal Incapacitation due to abnormal movements: None, normal Patient's awareness of abnormal movements (rate only patient's report): No Awareness, Dental Status Current problems with teeth and/or dentures?: No Does patient usually wear dentures?: No  CIWA:  CIWA-Ar Total: 0 COWS:  COWS Total Score: 3  Musculoskeletal: Strength & Muscle Tone: within normal limits Gait & Station: normal Patient leans: N/A  Psychiatric Specialty Exam: Physical Exam  ROS  Blood pressure 139/90, pulse 78, temperature  98.3 F (36.8 C), temperature source Oral, resp. rate 20, SpO2 93 %.There is no height or weight on file to calculate BMI.  General Appearance: casual  Eye Contact:  Fair  Speech:  Clear and Coherent  Volume:  Normal  Mood:  Euthymic  Affect:  Appropriate  Thought Process:  Irrelevant and Descriptions of Associations: Loose  Orientation:  Full (Time, Place, and Person)  Thought Content:  Illogical and Tangential  Suicidal Thoughts:  neg  Homicidal Thoughts:  No  Memory:  Immediate;   Fair  Judgement:  Fair  Insight:  Lacking  Psychomotor Activity:  EPS and Tremor  Concentration:  Concentration: Fair  Recall:  [employer] of Knowledge:  Fair  Language:  Fair  Akathisia:  Negative  Handed:  Right  AIMS (if indicated):     Assets:  Communication Skills Desire for Improvement  ADL's:  Intact  Cognition:  WNL  Sleep:  Number of Hours: 6.75     Treatment Plan Summary: Daily contact with patient to assess and evaluate symptoms and progress in treatment, Medication management and Plan Showing some improvement despite passive-aggressive behavior continue cognitive therapy med adjustments are all resisted and forced meds are still necessary one other note is we are going to go ahead and lower her lithium  level and see if that does not resolve her tremor it is more of a parkinsonian tremor but if that does not resolve it we will escalate the lithium back to 600 twice daily and reduce Haldol  Lily Kernen, MD 03/24/2019, 1:03 PM

## 2019-03-24 NOTE — Progress Notes (Signed)
Pt has been cooperative today. Took medications. Continues to be somewhat manic and gamy.

## 2019-03-25 NOTE — Progress Notes (Signed)
Paula Dickson denies SI/HI/AVH/Pain at present. Pt appears flat/paranoid/labile in affect and mood. Pt is calmer today. No new c/o's. Pt gets easily irritable when she doesn't get her way. Pt took scheduled Restoril HS. Support offered. Will continue with POC.

## 2019-03-25 NOTE — Progress Notes (Signed)
D: Pt denies SI/HI/AVH. Pt visible on unit this evening A: Pt was offered support and encouragement. Pt was given scheduled medications. Pt was encourage to attend groups. Q 15 minute checks were done for safety.  R: safety maintained on unit.

## 2019-03-25 NOTE — Progress Notes (Signed)
[facility] MD Progress Note  03/25/2019 2:11 PM Paula Dickson  MRN:  [id number] Subjective:   Patient continues to be very resistant to medications refusing long-acting injectable she has a parkinsonian tremor for medications but refuses to take any medication for it she also is intermittently intrusive but somewhat more contained behaviorally and more redirectable but still not well. Principal Problem: Bipolar I disorder, most recent episode manic, severe with psychotic features ([place]) Diagnosis: Principal Problem:   Bipolar I disorder, most recent episode manic, severe with psychotic features ([place]) Active Problems:   Schizoaffective disorder ([place])   Severe mixed bipolar 1 disorder without psychosis ([place])  Total Time spent with patient: 20 minutes   Past Medical History:  Past Medical History:  Diagnosis Date  . Bipolar affective disorder ([place])   . History of arthritis   . History of chicken pox   . History of depression   . History of genital warts   . history of heart murmur   . History of high blood pressure   . History of thyroid disease   . History of UTI   . Hypertension   . Low TSH level 07/13/2017  . Schizophrenia [facility])     Past Surgical History:  Procedure Laterality Date  . ABLATION ON ENDOMETRIOSIS    . CYST REMOVAL NECK     around 11 years ago /benign  . MULTIPLE TOOTH EXTRACTIONS     Family History:  Family History  Problem Relation Age of Onset  . Arthritis Father   . Hyperlipidemia Father   . High blood pressure Father   . Diabetes Sister   . Diabetes Mother   . Diabetes Brother   . Mental illness Brother   . Alcohol abuse Paternal Uncle   . Alcohol abuse Paternal Grandfather   . Breast cancer Maternal Aunt   . Breast cancer Paternal Aunt   . High blood pressure Sister   . Mental illness Other        runs in family   Family Psychiatric  History: neg Social History:  Social History   Substance and Sexual Activity  Alcohol Use No     Social  History   Substance and Sexual Activity  Drug Use No    Social History   Socioeconomic History  . Marital status: Single    Spouse name: Not on file  . Number of children: Not on file  . Years of education: Not on file  . Highest education level: Not on file  Occupational History  . Not on file  Social Needs  . Financial resource strain: Not on file  . Food insecurity:    Worry: Not on file    Inability: Not on file  . Transportation needs:    Medical: Not on file    Non-medical: Not on file  Tobacco Use  . Smoking status: Never Smoker  . Smokeless tobacco: Never Used  Substance and Sexual Activity  . Alcohol use: No  . Drug use: No  . Sexual activity: Not Currently  Lifestyle  . Physical activity:    Days per week: Not on file    Minutes per session: Not on file  . Stress: Not on file  Relationships  . Social connections:    Talks on phone: Not on file    Gets together: Not on file    Attends religious service: Not on file    Active member of club or organization: Not on file    Attends meetings of  clubs or organizations: Not on file    Relationship status: Not on file  Other Topics Concern  . Not on file  Social History Narrative  . Not on file   Additional Social History:    Pain Medications: See PTA medication list Prescriptions: See PTA medication list Over the Counter: See PTA medication list History of alcohol / drug use?: No history of alcohol / drug abuse(Pt denies.)                    Sleep: Poor  Appetite:  Good  Current Medications: Current Facility-Administered Medications  Medication Dose Route Frequency Provider Last Rate Last Dose  . benztropine (COGENTIN) tablet 1 mg  1 mg Oral BID Sharma Covert, MD   1 mg at 03/25/19 0758  . carvedilol (COREG) tablet 12.5 mg  12.5 mg Oral BID WC Sharma Covert, MD   12.5 mg at 03/25/19 0757  . clonazePAM (KLONOPIN) tablet 2 mg  2 mg Oral Once Johnn Hai, MD      . cloNIDine  (CATAPRES) tablet 0.1 mg  0.1 mg Oral TID PRN Sharma Covert, MD      . haloperidol lactate (HALDOL) injection 10 mg  10 mg Intramuscular BID Sharma Covert, MD       Or  . haloperidol (HALDOL) tablet 10 mg  10 mg Oral BID Sharma Covert, MD   10 mg at 03/25/19 0758  . hydrOXYzine (ATARAX/VISTARIL) tablet 25 mg  25 mg Oral TID PRN Laverle Hobby, PA-C      . lithium carbonate capsule 300 mg  300 mg Oral BID WC Johnn Hai, MD   300 mg at 03/25/19 0758  . LORazepam (ATIVAN) tablet 1 mg  1 mg Oral Q6H PRN Derrill Center, NP       Or  . LORazepam (ATIVAN) injection 1 mg  1 mg Intramuscular Q6H PRN Derrill Center, NP   1 mg at 03/20/19 0156  . LORazepam (ATIVAN) injection 4 mg  4 mg Intramuscular Once Johnn Hai, MD      . temazepam (RESTORIL) capsule 30 mg  30 mg Oral QHS Hampton Abbot, MD   30 mg at 03/24/19 2038    Lab Results:  Results for orders placed or performed during the hospital encounter of 03/13/19 (from the past 48 hour(s))  Comprehensive metabolic panel     Status: Abnormal   Collection Time: 03/24/19  6:39 AM  Result Value Ref Range   Sodium 145 135 - 145 mmol/L   Potassium 4.0 3.5 - 5.1 mmol/L   Chloride 112 (H) 98 - 111 mmol/L   CO2 26 22 - 32 mmol/L   Glucose, Bld 98 70 - 99 mg/dL   BUN 21 (H) 6 - 20 mg/dL   Creatinine, Ser 1.04 (H) 0.44 - 1.00 mg/dL   Calcium 10.0 8.9 - 10.3 mg/dL   Total Protein 6.8 6.5 - 8.1 g/dL   Albumin 4.0 3.5 - 5.0 g/dL   AST 16 15 - 41 U/L   ALT 24 0 - 44 U/L   Alkaline Phosphatase 74 38 - 126 U/L   Total Bilirubin 0.5 0.3 - 1.2 mg/dL   GFR calc non Af Amer >60 >60 mL/min   GFR calc Af Amer >60 >60 mL/min   Anion gap 7 5 - 15    Comment: Performed at [facility], [place] [address], [place], [place] [address]  Lithium level     Status: Abnormal  Collection Time: 03/24/19  6:39 AM  Result Value Ref Range   Lithium Lvl 0.49 (L) 0.60 - 1.20 mmol/L    Comment: Performed at [facility],  [place] [address], [place], [place] [address]  TSH     Status: Abnormal   Collection Time: 03/24/19  6:39 AM  Result Value Ref Range   TSH 0.214 (L) 0.350 - 4.500 uIU/mL    Comment: Performed by a 3rd Generation assay with a functional sensitivity of <=0.01 uIU/mL. Performed at [facility], [place] [address] [place], [place] [address]     Blood Alcohol level:  Lab Results  Component Value Date   [facility] <10 10/31/2018   ETH <5 52/84/1324    Metabolic Disorder Labs: Lab Results  Component Value Date   HGBA1C 5.6 03/14/2019   MPG 114.02 03/14/2019   MPG 108.28 11/03/2018   Lab Results  Component Value Date   PROLACTIN 11.0 03/14/2019   Lab Results  Component Value Date   CHOL 152 03/14/2019   TRIG 120 03/14/2019   HDL 50 03/14/2019   CHOLHDL 3.0 03/14/2019   VLDL 24 03/14/2019   LDLCALC 78 03/14/2019   LDLCALC 76 11/03/2018    Physical Findings: AIMS: Facial and Oral Movements Muscles of Facial Expression: None, normal Lips and Perioral Area: None, normal Jaw: None, normal Tongue: None, normal,Extremity Movements Upper (arms, wrists, hands, fingers): None, normal Lower (legs, knees, ankles, toes): None, normal, Trunk Movements Neck, shoulders, hips: None, normal, Overall Severity Severity of abnormal movements (highest score from questions above): None, normal Incapacitation due to abnormal movements: None, normal Patient's awareness of abnormal movements (rate only patient's report): No Awareness, Dental Status Current problems with teeth and/or dentures?: No Does patient usually wear dentures?: No  CIWA:  CIWA-Ar Total: 0 COWS:  COWS Total Score: 3  Musculoskeletal: Strength & Muscle Tone: within normal limits Gait & Station: normal Patient leans: N/A  Psychiatric Specialty Exam: Physical Exam  ROS  Blood pressure 134/87, pulse 79, temperature 98 F (36.7 C), temperature source Oral, resp. rate 20, SpO2 93 %.There is no height or weight on  file to calculate BMI.  General Appearance: Casual  Eye Contact:  Minimal  Speech:  Clear and Coherent  Volume:  Increased  Mood:  hypomanic  Affect:  Congruent  Thought Process:  Irrelevant and Descriptions of Associations: Loose  Orientation:  Full (Time, Place, and Person)  Thought Content:  Illogical, Paranoid Ideation and Tangential  Suicidal Thoughts:  No  Homicidal Thoughts:  No  Memory:  Immediate;   Poor  Judgement:  Impaired  Insight:  Lacking  Psychomotor Activity:  Decreased  Concentration:  Concentration: Fair  Recall:  [place] of Knowledge:  Fair  Language:  Fair  Akathisia:  Negative  Handed:  Right  AIMS (if indicated):     Assets:  Physical Health Resilience  ADL's:  Intact  Cognition:  WNL  Sleep:  Number of Hours: 1     Treatment Plan Summary: Daily contact with patient to assess and evaluate symptoms and progress in treatment, Medication management and Plan New to encourage full compliance continue to encourage long-acting injectable clearly not ready for discharge had court hearing today continue reality-based therapy med and illness education  Johnn Hai, MD 03/25/2019, 2:11 PM

## 2019-03-25 NOTE — Progress Notes (Signed)
Recreation Therapy Notes  Date: 4.28.20 Time: 1000 Location: 500 Hall Dayroom  Group Topic: Triggers  Goal Area(s) Addresses:  Patient will identify biggest triggers. Patient will identify how triggers can be avoided. Patient will identify how to face triggers head on.  Behavioral Response: Engaged  Intervention: Worksheet  Activity:  Triggers.  Patient was to identify their 3 biggest triggers, how they avoid/reduse exposure to triggers and how they deal with them head on.  Education: Triggers, Discharge Planning  Education Outcome: Acknowledges understanding/In group clarification offered/Needs additional education.   Clinical Observations/Feedback:  Pt stated a trigger is "something that presses your buttons that can trigger things you may still be dealing with or haven't dealt with".  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    [name], LRT/CTRS     [name] A 03/25/2019 11:29 AM

## 2019-03-25 NOTE — Progress Notes (Signed)
Patient ID: Chanita Boden Sebo, female   DOB: 1965/12/02, 53 y.o.   MRN: [id number]  [place] NOVEL CORONAVIRUS (COVID-19) DAILY CHECK-OFF SYMPTOMS - answer yes or no to each - every day NO YES  Have you had a fever in the past 24 hours?  . Fever (Temp > 37.80C / 100F) X   Have you had any of these symptoms in the past 24 hours? . New Cough .  Sore Throat  .  Shortness of Breath .  Difficulty Breathing .  Unexplained Body Aches   X   Have you had any one of these symptoms in the past 24 hours not related to allergies?   . Runny Nose .  Nasal Congestion .  Sneezing   X   If you have had runny nose, nasal congestion, sneezing in the past 24 hours, has it worsened?  X   EXPOSURES - check yes or no X   Have you traveled outside the state in the past 14 days?  X   Have you been in contact with someone with a confirmed diagnosis of COVID-19 or PUI in the past 14 days without wearing appropriate PPE?  X   Have you been living in the same home as a person with confirmed diagnosis of COVID-19 or a PUI (household contact)?    X   Have you been diagnosed with COVID-19?    X              What to do next: Answered NO to all: Answered YES to anything:   Proceed with unit schedule Follow the [facility] Inpatient Flowsheet.

## 2019-03-25 NOTE — Progress Notes (Signed)
[place] NOVEL CORONAVIRUS (COVID-19) DAILY CHECK-OFF SYMPTOMS - answer yes or no to each - every day NO YES  Have you had a fever in the past 24 hours?  . Fever (Temp > 37.80C / 100F) X   Have you had any of these symptoms in the past 24 hours? . New Cough .  Sore Throat  .  Shortness of Breath .  Difficulty Breathing .  Unexplained Body Aches   X   Have you had any one of these symptoms in the past 24 hours not related to allergies?   . Runny Nose .  Nasal Congestion .  Sneezing   X   If you have had runny nose, nasal congestion, sneezing in the past 24 hours, has it worsened?  X   EXPOSURES - check yes or no X   Have you traveled outside the state in the past 14 days?  X   Have you been in contact with someone with a confirmed diagnosis of COVID-19 or PUI in the past 14 days without wearing appropriate PPE?  X   Have you been living in the same home as a person with confirmed diagnosis of COVID-19 or a PUI (household contact)?    X   Have you been diagnosed with COVID-19?    X              What to do next: Answered NO to all: Answered YES to anything:   Proceed with unit schedule Follow the [facility] Inpatient Flowsheet.   

## 2019-03-25 NOTE — Progress Notes (Signed)
Adult Psychoeducational Group Note  Date:  03/25/2019 Time:  9:15 PM  Group Topic/Focus:  Wrap-Up Group:   The focus of this group is to help patients review their daily goal of treatment and discuss progress on daily workbooks.  Participation Level:  Active  Participation Quality:  Appropriate  Affect:  Appropriate  Cognitive:  Appropriate  Insight: Appropriate  Engagement in Group:  Engaged  Modes of Intervention:  Discussion  Additional Comments:  Patient attended group and said that her day was a 8.  Her goal for today were to make contact with people and rest. Patient said her goal was accomplished.   Paula Dickson W Joydan Gretzinger 5/68/1275, 9:15 PM

## 2019-03-25 NOTE — Progress Notes (Signed)
CSW and pt completed IVC court review with [place] through video link.  Pt was allowed to speak to the [employer] and share why she wanted to release.  Marveen Reeks ordered additional 10 days IVC. Winferd Humphrey, MSW, LCSW Clinical Social Worker 03/25/2019 2:22 PM

## 2019-03-26 MED ORDER — LITHIUM CARBONATE 300 MG PO CAPS
300.0000 mg | ORAL_CAPSULE | Freq: Every day | ORAL | Status: DC
  Administered 2019-03-26 – 2019-04-01 (×7): 300 mg via ORAL
  Filled 2019-03-26 (×9): qty 1

## 2019-03-26 MED ORDER — LITHIUM CARBONATE 300 MG PO CAPS
600.0000 mg | ORAL_CAPSULE | Freq: Every day | ORAL | Status: DC
  Administered 2019-03-27 – 2019-03-31 (×5): 600 mg via ORAL
  Filled 2019-03-26 (×7): qty 2

## 2019-03-26 NOTE — Plan of Care (Signed)
D: Pt denies SI/HI/AVH. Pt is pleasant and cooperative. Pt continues to be controversial at times. Pt visible on the unit, pt gets paranoid at times.  A: Pt was offered support and encouragement. Pt was given scheduled medications. Pt was encourage to attend groups. Q 15 minute checks were done for safety.  R:Pt attends groups and interacts well with peers and staff. Pt is taking medication. Pt receptive to treatment and safety maintained on unit.  Problem: Safety: Goal: Ability to demonstrate self-control will improve Outcome: Progressing   Problem: Education: Goal: Emotional status will improve Outcome: Progressing

## 2019-03-26 NOTE — Progress Notes (Signed)
The focus of this group is to help patients establish daily goals to achieve during treatment and discuss how the patient can incorporate goal setting into their daily lives to aide in recovery. ° °Pt did not attend goals group °

## 2019-03-26 NOTE — Progress Notes (Signed)
Patient ID: Paula Dickson, female   DOB: 08-Nov-1966, 53 y.o.   MRN: [id number] [place] NOVEL CORONAVIRUS (COVID-19) DAILY CHECK-OFF SYMPTOMS - answer yes or no to each - every day NO YES  Have you had a fever in the past 24 hours?  . Fever (Temp > 37.80C / 100F) X   Have you had any of these symptoms in the past 24 hours? . New Cough .  Sore Throat  .  Shortness of Breath .  Difficulty Breathing .  Unexplained Body Aches   X   Have you had any one of these symptoms in the past 24 hours not related to allergies?   . Runny Nose .  Nasal Congestion .  Sneezing   X   If you have had runny nose, nasal congestion, sneezing in the past 24 hours, has it worsened?  X   EXPOSURES - check yes or no X   Have you traveled outside the state in the past 14 days?  X   Have you been in contact with someone with a confirmed diagnosis of COVID-19 or PUI in the past 14 days without wearing appropriate PPE?  X   Have you been living in the same home as a person with confirmed diagnosis of COVID-19 or a PUI (household contact)?    X   Have you been diagnosed with COVID-19?    X              What to do next: Answered NO to all: Answered YES to anything:   Proceed with unit schedule Follow the [facility] Inpatient Flowsheet.

## 2019-03-26 NOTE — Progress Notes (Signed)
Recreation Therapy Notes  Date: 4.29.20 Time: 1000 Location: 500 Hall Dayroom  Group Topic: Wellness  Goal Area(s) Addresses:  Patient will define components of whole wellness. Patient will verbalize benefit of whole wellness.  Behavioral Response:  Engaged  Intervention:  Exercise, Music   Activity:  Exercise.  LRT lead group in [place]ickson series of stretches to loosen them up before going into the exercises.  Each patient got the opportunity to lead the group in an exercise of their choice.  The group was to get at least 30 minutes of exercise.  Patients could take water breaks/breaks as needed.  Education: Wellness, Dentist.   Education Outcome: Acknowledges education/In group clarification offered/Needs additional education.   Clinical Observations/Feedback:  Pt was active and participated in the activity.  Pt lead group in hip rotations and toe raises.  Pt completed every exercise presented during group.    Paula Dickson, LRT/CTRS    Ria Comment, Paula Dickson 03/26/2019 11:03 AM

## 2019-03-26 NOTE — Progress Notes (Signed)
[facility] MD Progress Note  03/26/2019 10:00 AM Paula Dickson  MRN:  [id number] Subjective:   Patient is still intrusive and hypomanic but more compliant with meds, insist on a second interview after rounds than a third interview and I keep redirecting her telling her I cannot speak with her every time she sees me on the ward quickly when I am talking with other patients she still has a parkinsonian type tremor but refuses to let me address that the good news is it is simply a bit induced  Principal Problem: Bipolar I disorder, most recent episode manic, severe with psychotic features ([place]) Diagnosis: Principal Problem:   Bipolar I disorder, most recent episode manic, severe with psychotic features ([place]) Active Problems:   Schizoaffective disorder ([place])   Severe mixed bipolar 1 disorder without psychosis ([place])  Total Time spent with patient: 20 minutes  Past Medical History:  Past Medical History:  Diagnosis Date  . Bipolar affective disorder ([place])   . History of arthritis   . History of chicken pox   . History of depression   . History of genital warts   . history of heart murmur   . History of high blood pressure   . History of thyroid disease   . History of UTI   . Hypertension   . Low TSH level 07/13/2017  . Schizophrenia [facility])     Past Surgical History:  Procedure Laterality Date  . ABLATION ON ENDOMETRIOSIS    . CYST REMOVAL NECK     around 11 years ago /benign  . MULTIPLE TOOTH EXTRACTIONS     Family History:  Family History  Problem Relation Age of Onset  . Arthritis Father   . Hyperlipidemia Father   . High blood pressure Father   . Diabetes Sister   . Diabetes Mother   . Diabetes Brother   . Mental illness Brother   . Alcohol abuse Paternal Uncle   . Alcohol abuse Paternal Grandfather   . Breast cancer Maternal Aunt   . Breast cancer Paternal Aunt   . High blood pressure Sister   . Mental illness Other        runs in family    Social History:   Social History   Substance and Sexual Activity  Alcohol Use No     Social History   Substance and Sexual Activity  Drug Use No    Social History   Socioeconomic History  . Marital status: Single    Spouse name: Not on file  . Number of children: Not on file  . Years of education: Not on file  . Highest education level: Not on file  Occupational History  . Not on file  Social Needs  . Financial resource strain: Not on file  . Food insecurity:    Worry: Not on file    Inability: Not on file  . Transportation needs:    Medical: Not on file    Non-medical: Not on file  Tobacco Use  . Smoking status: Never Smoker  . Smokeless tobacco: Never Used  Substance and Sexual Activity  . Alcohol use: No  . Drug use: No  . Sexual activity: Not Currently  Lifestyle  . Physical activity:    Days per week: Not on file    Minutes per session: Not on file  . Stress: Not on file  Relationships  . Social connections:    Talks on phone: Not on file    Gets together: Not on file  Attends religious service: Not on file    Active member of club or organization: Not on file    Attends meetings of clubs or organizations: Not on file    Relationship status: Not on file  Other Topics Concern  . Not on file  Social History Narrative  . Not on file   Additional Social History:    Pain Medications: See PTA medication list Prescriptions: See PTA medication list Over the Counter: See PTA medication list History of alcohol / drug use?: No history of alcohol / drug abuse(Pt denies.)                    Sleep: Less than 4 hours  Appetite:  Good  Current Medications: Current Facility-Administered Medications  Medication Dose Route Frequency Provider Last Rate Last Dose  . benztropine (COGENTIN) tablet 1 mg  1 mg Oral BID Sharma Covert, MD   1 mg at 03/26/19 0751  . carvedilol (COREG) tablet 12.5 mg  12.5 mg Oral BID WC Sharma Covert, MD   12.5 mg at 03/26/19 0751   . clonazePAM (KLONOPIN) tablet 2 mg  2 mg Oral Once Johnn Hai, MD      . cloNIDine (CATAPRES) tablet 0.1 mg  0.1 mg Oral TID PRN Sharma Covert, MD      . haloperidol lactate (HALDOL) injection 10 mg  10 mg Intramuscular BID Sharma Covert, MD       Or  . haloperidol (HALDOL) tablet 10 mg  10 mg Oral BID Sharma Covert, MD   10 mg at 03/26/19 0751  . hydrOXYzine (ATARAX/VISTARIL) tablet 25 mg  25 mg Oral TID PRN Laverle Hobby, PA-C      . lithium carbonate capsule 300 mg  300 mg Oral BID WC Johnn Hai, MD   300 mg at 03/26/19 0751  . LORazepam (ATIVAN) tablet 1 mg  1 mg Oral Q6H PRN Derrill Center, NP       Or  . LORazepam (ATIVAN) injection 1 mg  1 mg Intramuscular Q6H PRN Derrill Center, NP   1 mg at 03/20/19 0156  . LORazepam (ATIVAN) injection 4 mg  4 mg Intramuscular Once Johnn Hai, MD      . temazepam (RESTORIL) capsule 30 mg  30 mg Oral QHS Hampton Abbot, MD   30 mg at 03/25/19 2102    Lab Results: No results found for this or any previous visit (from the past 82 hour(s)).  Blood Alcohol level:  Lab Results  Component Value Date   ETH <10 10/31/2018   ETH <5 07/62/2633    Metabolic Disorder Labs: Lab Results  Component Value Date   HGBA1C 5.6 03/14/2019   MPG 114.02 03/14/2019   MPG 108.28 11/03/2018   Lab Results  Component Value Date   PROLACTIN 11.0 03/14/2019   Lab Results  Component Value Date   CHOL 152 03/14/2019   TRIG 120 03/14/2019   HDL 50 03/14/2019   CHOLHDL 3.0 03/14/2019   VLDL 24 03/14/2019   LDLCALC 78 03/14/2019   LDLCALC 76 11/03/2018    Physical Findings: AIMS: Facial and Oral Movements Muscles of Facial Expression: None, normal Lips and Perioral Area: None, normal Jaw: None, normal Tongue: None, normal,Extremity Movements Upper (arms, wrists, hands, fingers): None, normal Lower (legs, knees, ankles, toes): None, normal, Trunk Movements Neck, shoulders, hips: None, normal, Overall Severity Severity of  abnormal movements (highest score from questions above): None, normal Incapacitation due to abnormal movements:  None, normal Patient's awareness of abnormal movements (rate only patient's report): No Awareness, Dental Status Current problems with teeth and/or dentures?: No Does patient usually wear dentures?: No  CIWA:  CIWA-Ar Total: 0 COWS:  COWS Total Score: 3  Musculoskeletal: Strength & Muscle Tone: within normal limits Gait & Station: normal Patient leans: N/A  Psychiatric Specialty Exam: Physical Exam  ROS  Blood pressure (!) 146/91, pulse 76, temperature 98.3 F (36.8 C), temperature source Oral, resp. rate 20, SpO2 93 %.There is no height or weight on file to calculate BMI.  General Appearance: groomed  Eye Contact:  Good  Speech:  Pressured  Volume:  Increased  Mood:  Irritable and hypoamnic  Affect:  Appropriate  Thought Process:  Goal Directed and Irrelevant  Orientation:  Full (Time, Place, and Person)  Thought Content:  Illogical, Delusions and Rumination  Suicidal Thoughts:  No  Homicidal Thoughts:  No  Memory:  Recent;   Fair  Judgement:  Impaired  Insight:  Lacking  Psychomotor Activity:  Normal  Concentration:  Concentration: Fair  Recall:  [employer] of Knowledge:  Fair  Language:  Fair  Akathisia:  Negative  Handed:  Right  AIMS (if indicated):     Assets:  Leisure Time Physical Health Social Support  ADL's:  Intact  Cognition:  WNL  Sleep:  Number of Hours: 3.75     Treatment Plan Summary: Daily contact with patient to assess and evaluate symptoms and progress in treatment, Medication management and Plan Sleep is still poor still hypomanic continue to encourage full compliance continue cognitive-based therapies  Miyoko Hashimi, MD 03/26/2019, 10:00 AM

## 2019-03-26 NOTE — Progress Notes (Signed)
Patient ID: Paula Dickson, female   DOB: 08-10-1966, 53 y.o.   MRN: [id number] D) Pt mood has been labile, irritable at times without provocation. affect constricted, sullen. Pt active in the milieu but irritable and hostile to peers and staff frequently. Pt has been intrusive and needy. Thoughts disorganized with thought blocking present. Pt paranoid and suspicious. Denies avh. A) level 3 obs for safety, support and encouragement provided. Redirection as needed.  .med ed reinforced. R) Labile.

## 2019-03-26 NOTE — BHH Group Notes (Signed)
Occupational Therapy Group Note  Date:  03/26/2019 Time:  9:48 PM  Group Topic/Focus:  Leisure Group  Participation Level:  Active  Participation Quality:  Redirectable  Affect:  Flat and Irritable  Cognitive:  Disorganized  Insight: Lacking  Engagement in Group:  Engaged  Modes of Intervention:  Activity, Discussion, Education and Socialization  Additional Comments:    S: "We need to be able to talk about our problems to have them fixed"  O: OT Group with focus on leisure and coping skills. Juel Burrow game facilitated with special stipulations to name various coping skills throughout the game. Pt attention and sequencing assessed throughout session.  A: Pt presents with flat and irritable affect, engaged and participatory throughout session. Pt attending throughout activity with max VC's to stay on task. Pt frequently switching chairs and appearing restless and irritable. Stating nonsensical statements such as "it's really not that hard" or "no you can just do that", etc. Pt engaged in game, needing max VC's to continue in order of turn. Pt leaving session and coming back and stating again how she needs to talk about her problems, appearing again in a very irritable manner.  P: OT group will be x1 per week while pt in [place], [place], OTR/L [place] Office: [place] 03/26/2019, 9:48 PM

## 2019-03-27 NOTE — Progress Notes (Signed)
[place] NOVEL CORONAVIRUS (COVID-19) DAILY CHECK-OFF SYMPTOMS - answer yes or no to each - every day NO YES  Have you had a fever in the past 24 hours?  . Fever (Temp > 37.80C / 100F) X   Have you had any of these symptoms in the past 24 hours? . New Cough .  Sore Throat  .  Shortness of Breath .  Difficulty Breathing .  Unexplained Body Aches   X   Have you had any one of these symptoms in the past 24 hours not related to allergies?   . Runny Nose .  Nasal Congestion .  Sneezing   X   If you have had runny nose, nasal congestion, sneezing in the past 24 hours, has it worsened?  X   EXPOSURES - check yes or no X   Have you traveled outside the state in the past 14 days?  X   Have you been in contact with someone with a confirmed diagnosis of COVID-19 or PUI in the past 14 days without wearing appropriate PPE?  X   Have you been living in the same home as a person with confirmed diagnosis of COVID-19 or a PUI (household contact)?    X   Have you been diagnosed with COVID-19?    X              What to do next: Answered NO to all: Answered YES to anything:   Proceed with unit schedule Follow the [facility] Inpatient Flowsheet.   

## 2019-03-27 NOTE — Progress Notes (Signed)
[facility] MD Progress Note  03/27/2019 1:18 PM Paula Dickson  MRN:  [id number] Subjective:   Patient continues to be intrusive and seeking multiple interviews throughout the day literally every time she sees me having to comment in my questions and asked for further interviews she has a parkinsonian tremor refuses to address that of course is med induced she is still intrusive and hypomanic but denies wanting to harm self or others slow progress due to poor compliance Principal Problem: Bipolar I disorder, most recent episode manic, severe with psychotic features ([place]) Diagnosis: Principal Problem:   Bipolar I disorder, most recent episode manic, severe with psychotic features ([place]) Active Problems:   Schizoaffective disorder ([place])   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 Bipolar affective disorder ([place])   . History of arthritis   . History of chicken pox   . History of depression   . History of genital warts   . history of heart murmur   . History of high blood pressure   . History of thyroid disease   . History of UTI   . Hypertension   . Low TSH level 07/13/2017  . Schizophrenia [facility])     Past Surgical History:  Procedure Laterality Date  . ABLATION ON ENDOMETRIOSIS    . CYST REMOVAL NECK     around 11 years ago /benign  . MULTIPLE TOOTH EXTRACTIONS     Family History:  Family History  Problem Relation Age of Onset  . Arthritis Father   . Hyperlipidemia Father   . High blood pressure Father   . Diabetes Sister   . Diabetes Mother   . Diabetes Brother   . Mental illness Brother   . Alcohol abuse Paternal Uncle   . Alcohol abuse Paternal Grandfather   . Breast cancer Maternal Aunt   . Breast cancer Paternal Aunt   . High blood pressure Sister   . Mental illness Other        runs in family    Social History:  Social History    Substance and Sexual Activity  Alcohol Use No     Social History   Substance and Sexual Activity  Drug Use No    Social History   Socioeconomic History  . Marital status: Single    Spouse name: Not on file  . Number of children: Not on file  . Years of education: Not on file  . Highest education level: Not on file  Occupational History  . Not on file  Social Needs  . Financial resource strain: Not on file  . Food insecurity:    Worry: Not on file    Inability: Not on file  . Transportation needs:    Medical: Not on file    Non-medical: Not on file  Tobacco Use  . Smoking status: Never Smoker  . Smokeless tobacco: Never Used  Substance and Sexual Activity  . Alcohol use: No  . Drug use: No  . Sexual activity: Not Currently  Lifestyle  . Physical activity:    Days per week: Not on file    Minutes per session: Not on file  . Stress: Not on file  Relationships  . Social connections:    Talks on phone: Not on file    Gets together: Not on file    Attends religious service: Not on file  Active member of club or organization: Not on file    Attends meetings of clubs or organizations: Not on file    Relationship status: Not on file  Other Topics Concern  . Not on file  Social History Narrative  . Not on file   Additional Social History:    Pain Medications: See PTA medication list Prescriptions: See PTA medication list Over the Counter: See PTA medication list History of alcohol / drug use?: No history of alcohol / drug abuse(Pt denies.)                    Sleep: Good  Appetite:  Good  Current Medications: Current Facility-Administered Medications  Medication Dose Route Frequency Provider Last Rate Last Dose  . benztropine (COGENTIN) tablet 1 mg  1 mg Oral BID Sharma Covert, MD   1 mg at 03/27/19 0751  . carvedilol (COREG) tablet 12.5 mg  12.5 mg Oral BID WC Sharma Covert, MD   12.5 mg at 03/27/19 0751  . clonazePAM (KLONOPIN) tablet  2 mg  2 mg Oral Once Johnn Hai, MD      . cloNIDine (CATAPRES) tablet 0.1 mg  0.1 mg Oral TID PRN Sharma Covert, MD      . haloperidol lactate (HALDOL) injection 10 mg  10 mg Intramuscular BID Sharma Covert, MD       Or  . haloperidol (HALDOL) tablet 10 mg  10 mg Oral BID Sharma Covert, MD   10 mg at 03/27/19 0751  . hydrOXYzine (ATARAX/VISTARIL) tablet 25 mg  25 mg Oral TID PRN Laverle Hobby, PA-C   25 mg at 03/26/19 2115  . lithium carbonate capsule 300 mg  300 mg Oral Daily Johnn Hai, MD   300 mg at 03/27/19 0754  . lithium carbonate capsule 600 mg  600 mg Oral QHS Johnn Hai, MD      . LORazepam (ATIVAN) tablet 1 mg  1 mg Oral Q6H PRN Derrill Center, NP       Or  . LORazepam (ATIVAN) injection 1 mg  1 mg Intramuscular Q6H PRN Derrill Center, NP   1 mg at 03/20/19 0156  . LORazepam (ATIVAN) injection 4 mg  4 mg Intramuscular Once Johnn Hai, MD      . temazepam (RESTORIL) capsule 30 mg  30 mg Oral QHS Hampton Abbot, MD   30 mg at 03/26/19 2115    Lab Results: No results found for this or any previous visit (from the past 75 hour(s)).  Blood Alcohol level:  Lab Results  Component Value Date   ETH <10 10/31/2018   ETH <5 46/50/3546    Metabolic Disorder Labs: Lab Results  Component Value Date   HGBA1C 5.6 03/14/2019   MPG 114.02 03/14/2019   MPG 108.28 11/03/2018   Lab Results  Component Value Date   PROLACTIN 11.0 03/14/2019   Lab Results  Component Value Date   CHOL 152 03/14/2019   TRIG 120 03/14/2019   HDL 50 03/14/2019   CHOLHDL 3.0 03/14/2019   VLDL 24 03/14/2019   LDLCALC 78 03/14/2019   LDLCALC 76 11/03/2018    Physical Findings: AIMS: Facial and Oral Movements Muscles of Facial Expression: None, normal Lips and Perioral Area: None, normal Jaw: None, normal Tongue: None, normal,Extremity Movements Upper (arms, wrists, hands, fingers): None, normal Lower (legs, knees, ankles, toes): None, normal, Trunk Movements Neck,  shoulders, hips: None, normal, Overall Severity Severity of abnormal movements (highest score from  questions above): None, normal Incapacitation due to abnormal movements: None, normal Patient's awareness of abnormal movements (rate only patient's report): No Awareness, Dental Status Current problems with teeth and/or dentures?: No Does patient usually wear dentures?: No  CIWA:  CIWA-Ar Total: 0 COWS:  COWS Total Score: 3  Musculoskeletal: Strength & Muscle Tone: within normal limits Gait & Station: normal Patient leans: N/A  Psychiatric Specialty Exam: Physical Exam  ROS  Blood pressure (!) 143/88, pulse 71, temperature 97.9 F (36.6 C), resp. rate 20, SpO2 93 %.There is no height or weight on file to calculate BMI.  General Appearance: Casual  Eye Contact:  Absent  Speech:  Clear and Coherent  Volume:  Increased  Mood:  Anxious and Irritable  Affect:  Constricted and Restricted  Thought Process:  Goal Directed, Linear and Descriptions of Associations: Loose  Orientation:  Full (Time, Place, and Person)  Thought Content:  Illogical, Delusions, Obsessions and Tangential  Suicidal Thoughts:  No  Homicidal Thoughts:  No  Memory:  Recent;   Poor  Judgement:  Impaired  Insight:  Lacking  Psychomotor Activity:  Normal  Concentration:  Concentration: Poor  Recall:  Good  Fund of Knowledge:  Good  Language:  Good  Akathisia:  Negative  Handed:  Right  AIMS (if indicated):     Assets:  Physical Health Resilience  ADL's:  Intact  Cognition:  WNL  Sleep:  Number of Hours: 5.25     Treatment Plan Summary: Daily contact with patient to assess and evaluate symptoms and progress in treatment, Medication management and Plan Continue cognitive-based therapy already based therapy med adjustments as tolerated  Johnn Hai, MD 03/27/2019, 1:18 PM

## 2019-03-27 NOTE — Progress Notes (Signed)
Pt attended goals/ orientation groups. Pt was appropriate. Staff discussed the unit rules and schedule.

## 2019-03-27 NOTE — Progress Notes (Signed)
Nursing Progress Note: 7p-7a D: Pt currently presents with a labile affect and behavior. Pt states, "You want to argue with me right now, but I won't. I'm just going to go to bed and not worry with you or anyone else. God is good. You should count your blessings." Interacting minimally with the milieu. Pt reports good sleep during the previous night with current medication regimen.   A: Pt provided with medications per providers orders. Pt's labs and vitals were monitored throughout the night. Pt supported emotionally and encouraged to express concerns and questions. Pt educated on medications.  R: Pt's safety ensured with 15 minute and environmental checks. Pt currently denies SI, HI, and AVH. Pt verbally contracts to seek staff if SI,HI, or AVH occurs and to consult with staff before acting on any harmful thoughts. Will continue to monitor.

## 2019-03-27 NOTE — BHH Group Notes (Signed)
[place] LCSW Group Therapy Note  Date/Time: 03/27/19, 1315  Type of Therapy/Topic:  Group Therapy:  Balance in Life  Participation Level:  active  Description of Group:    This group will address the concept of balance and how it feels and looks when one is unbalanced. Patients will be encouraged to process areas in their lives that are out of balance, and identify reasons for remaining unbalanced. Facilitators will guide patients utilizing problem- solving interventions to address and correct the stressor making their life unbalanced. Understanding and applying boundaries will be explored and addressed for obtaining  and maintaining a balanced life. Patients will be encouraged to explore ways to assertively make their unbalanced needs known to significant others in their lives, using other group members and facilitator for support and feedback.  Therapeutic Goals: 1. Patient will identify two or more emotions or situations they have that consume much of in their lives. 2. Patient will identify signs/triggers that life has become out of balance:  3. Patient will identify two ways to set boundaries in order to achieve balance in their lives:  4. Patient will demonstrate ability to communicate their needs through discussion and/or role plays  Summary of Patient Progress:Pt identified family and financial as areas that are out of balance in her life.  Pt appropriate during group, made appropriate comments, was not intrusive.          Therapeutic Modalities:   Cognitive Behavioral Therapy Solution-Focused Therapy Assertiveness Training  Lurline Idol, [place]

## 2019-03-27 NOTE — Progress Notes (Signed)
Recreation Therapy Notes  Date: 4.30.20 Time:  1000 Location: [address] Dayroom  Group Topic: Communication, Team Building, Problem Solving  Goal Area(s) Addresses:  Patient will effectively work with peer towards shared goal.  Patient will identify skills used to make activity successful.  Patient will identify how skills used during activity can be used to reach post d/c goals.   Behavioral Response: Engaged  Intervention: STEM Activity  Activity: Geophysicist/field seismologist. In teams patients were given 12 plastic drinking straws and a length of masking tape. Using the materials provided patients were asked to build a landing pad to catch a golf ball dropped from approximately 6 feet in the air.   Education: Education officer, community, Discharge Planning   Education Outcome: Acknowledges education/In group clarification offered/Needs additional education.   Clinical Observations/Feedback: Pt was engaged and active during group session.  Pt worked well with her peer.  Pt was bright and active.  Pt also sang along to the music that was playing during group.  Pt expressed the group used communication and teamwork to complete the activity.  Pt stated these skills could be used with her support system by speaking clearly about what's going on with her.  Pt also stated she would tell them her plan and if they didn't agree with her plan she would find someone who does.     [name], LRT/CTRS     Ria Comment, Kyrese Gartman A 03/27/2019 11:13 AM

## 2019-03-28 NOTE — BHH Group Notes (Signed)
  [facility] LCSW Group Therapy Note  Date/Time: 03/28/19, 1310  Type of Therapy/Topic:  Group Therapy:  Emotion Regulation  Participation Level:  Active   Mood:pleasant  Description of Group:    The purpose of this group is to assist patients in learning to regulate negative emotions and experience positive emotions. Patients will be guided to discuss ways in which they have been vulnerable to their negative emotions. These vulnerabilities will be juxtaposed with experiences of positive emotions or situations, and patients challenged to use positive emotions to combat negative ones. Special emphasis will be placed on coping with negative emotions in conflict situations, and patients will process healthy conflict resolution skills.  Therapeutic Goals: 1. Patient will identify two positive emotions or experiences to reflect on in order to balance out negative emotions:  2. Patient will label two or more emotions that they find the most difficult to experience:  3. Patient will be able to demonstrate positive conflict resolution skills through discussion or role plays:   Summary of Patient Progress:Pt again very talkative in group but not inappropriate.  Pt said irritability is the hardest emotion for her to experience.  Pt shared that her faith is how she deals with strong emotions and she read from a Psalm in the Bible she had with her.       Therapeutic Modalities:   Cognitive Behavioral Therapy Feelings Identification Dialectical Behavioral Therapy  Lurline Idol, LCSW

## 2019-03-28 NOTE — Progress Notes (Signed)
[facility] MD Progress Note  03/28/2019 12:35 PM Paula Dickson  MRN:  [id number] Subjective:  Generally making slow progress is less intrusive more compliant still nonsensical at times and intrusive and wanting to speak to examiners every time she encounters [place] but denies wanting to harm self or others again is showing some gradual improvement still has parkinsonian tremor from meds Principal Problem: Bipolar I disorder, most recent episode manic, severe with psychotic features ([place]) Diagnosis: Principal Problem:   Bipolar I disorder, most recent episode manic, severe with psychotic features ([place]) Active Problems:   Schizoaffective disorder ([place])   Severe mixed bipolar 1 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 Bipolar affective disorder ([place])   . History of arthritis   . History of chicken pox   . History of depression   . History of genital warts   . history of heart murmur   . History of high blood pressure   . History of thyroid disease   . History of UTI   . Hypertension   . Low TSH level 07/13/2017  . Schizophrenia [facility])     Past Surgical History:  Procedure Laterality Date  . ABLATION ON ENDOMETRIOSIS    . CYST REMOVAL NECK     around 11 years ago /benign  . MULTIPLE TOOTH EXTRACTIONS     Family History:  Family History  Problem Relation Age of Onset  . Arthritis Father   . Hyperlipidemia Father   . High blood pressure Father   . Diabetes Sister   . Diabetes Mother   . Diabetes Brother   . Mental illness Brother   . Alcohol abuse Paternal Uncle   . Alcohol abuse Paternal Grandfather   . Breast cancer Maternal Aunt   . Breast cancer Paternal Aunt   . High blood pressure Sister   . Mental illness Other        runs in family    Social History:  Social History   Substance and Sexual Activity  Alcohol Use No     Social History   Substance and Sexual  Activity  Drug Use No    Social History   Socioeconomic History  . Marital status: Single    Spouse name: Not on file  . Number of children: Not on file  . Years of education: Not on file  . Highest education level: Not on file  Occupational History  . Not on file  Social Needs  . Financial resource strain: Not on file  . Food insecurity:    Worry: Not on file    Inability: Not on file  . Transportation needs:    Medical: Not on file    Non-medical: Not on file  Tobacco Use  . Smoking status: Never Smoker  . Smokeless tobacco: Never Used  Substance and Sexual Activity  . Alcohol use: No  . Drug use: No  . Sexual activity: Not Currently  Lifestyle  . Physical activity:    Days per week: Not on file    Minutes per session: Not on file  . Stress: Not on file  Relationships  . Social connections:    Talks on phone: Not on file    Gets together: Not on file    Attends religious service: Not on file    Active member of club or organization: Not on file    Attends meetings of clubs or organizations:  Not on file    Relationship status: Not on file  Other Topics Concern  . Not on file  Social History Narrative  . Not on file   Additional Social History:    Pain Medications: See PTA medication list Prescriptions: See PTA medication list Over the Counter: See PTA medication list History of alcohol / drug use?: No history of alcohol / drug abuse(Pt denies.)                    Sleep: Good  Appetite:  Good  Current Medications: Current Facility-Administered Medications  Medication Dose Route Frequency Provider Last Rate Last Dose  . benztropine (COGENTIN) tablet 1 mg  1 mg Oral BID Sharma Covert, MD   1 mg at 03/28/19 0750  . carvedilol (COREG) tablet 12.5 mg  12.5 mg Oral BID WC Sharma Covert, MD   12.5 mg at 03/28/19 0750  . clonazePAM (KLONOPIN) tablet 2 mg  2 mg Oral Once Johnn Hai, MD      . cloNIDine (CATAPRES) tablet 0.1 mg  0.1 mg Oral  TID PRN Sharma Covert, MD      . haloperidol lactate (HALDOL) injection 10 mg  10 mg Intramuscular BID Sharma Covert, MD       Or  . haloperidol (HALDOL) tablet 10 mg  10 mg Oral BID Sharma Covert, MD   10 mg at 03/28/19 0750  . hydrOXYzine (ATARAX/VISTARIL) tablet 25 mg  25 mg Oral TID PRN Patriciaann Clan E, PA-C   25 mg at 03/27/19 2054  . lithium carbonate capsule 300 mg  300 mg Oral Daily Johnn Hai, MD   300 mg at 03/28/19 0750  . lithium carbonate capsule 600 mg  600 mg Oral QHS Johnn Hai, MD   600 mg at 03/27/19 2054  . LORazepam (ATIVAN) tablet 1 mg  1 mg Oral Q6H PRN Derrill Center, NP       Or  . LORazepam (ATIVAN) injection 1 mg  1 mg Intramuscular Q6H PRN Derrill Center, NP   1 mg at 03/20/19 0156  . LORazepam (ATIVAN) injection 4 mg  4 mg Intramuscular Once Johnn Hai, MD      . temazepam (RESTORIL) capsule 30 mg  30 mg Oral QHS Hampton Abbot, MD   30 mg at 03/27/19 2054    Lab Results: No results found for this or any previous visit (from the past 12 hour(s)).  Blood Alcohol level:  Lab Results  Component Value Date   ETH <10 10/31/2018   ETH <5 78/24/2353    Metabolic Disorder Labs: Lab Results  Component Value Date   HGBA1C 5.6 03/14/2019   MPG 114.02 03/14/2019   MPG 108.28 11/03/2018   Lab Results  Component Value Date   PROLACTIN 11.0 03/14/2019   Lab Results  Component Value Date   CHOL 152 03/14/2019   TRIG 120 03/14/2019   HDL 50 03/14/2019   CHOLHDL 3.0 03/14/2019   VLDL 24 03/14/2019   LDLCALC 78 03/14/2019   LDLCALC 76 11/03/2018    Physical Findings: AIMS: Facial and Oral Movements Muscles of Facial Expression: None, normal Lips and Perioral Area: None, normal Jaw: None, normal Tongue: None, normal,Extremity Movements Upper (arms, wrists, hands, fingers): None, normal Lower (legs, knees, ankles, toes): None, normal, Trunk Movements Neck, shoulders, hips: None, normal, Overall Severity Severity of abnormal  movements (highest score from questions above): None, normal Incapacitation due to abnormal movements: None, normal Patient's awareness of abnormal  movements (rate only patient's report): No Awareness, Dental Status Current problems with teeth and/or dentures?: No Does patient usually wear dentures?: No  CIWA:  CIWA-Ar Total: 0 COWS:  COWS Total Score: 3  Musculoskeletal: Strength & Muscle Tone: within normal limits Gait & Station: normal Patient leans: N/A  Psychiatric Specialty Exam: Physical Exam  ROS  Blood pressure (!) 153/96, pulse 70, temperature 97.9 F (36.6 C), resp. rate 16, SpO2 93 %.There is no height or weight on file to calculate BMI.  General Appearance: Casual  Eye Contact:  Good  Speech:  Clear and Coherent  Volume:  Increased  Mood:  Irritable and hypomanic  Affect:  Congruent  Thought Process:  Disorganized and Irrelevant  Orientation:  Full (Time, Place, and Person)  Thought Content:  Illogical, Paranoid Ideation, Rumination and Tangential  Suicidal Thoughts:  No  Homicidal Thoughts:  No  Memory:  NA Immediate;   Fair  Judgement:  Fair  Insight:  Fair  Psychomotor Activity:  Normal  Concentration:  Concentration: Fair  Recall:  [employer] of Knowledge:  Fair  Language:  Fair  Akathisia:  Negative  Handed:  Right  AIMS (if indicated):     Assets:  Resilience Social Support  ADL's:  Intact  Cognition:  WNL  Sleep:  Number of Hours: 5.5     Treatment Plan Summary: Daily contact with patient to assess and evaluate symptoms and progress in treatment, Medication management and Plan To new cognitive therapy reality-based therapy continue to monitor for safety no change in precautions today meds seem to be helpful  Johnn Hai, MD 03/28/2019, 12:35 PM

## 2019-03-28 NOTE — Plan of Care (Signed)
Progress note  D: pt found in the hallway; compliant with medication administration. Pt denies any physical symptoms or pain. Pt is restless, anxious, and paranoid in affect. Pt denies si/hi/ah/vh and verbally agrees to approach staff if these become apparent or before harming herself/others while at [facility]. Pt continues to be needy, but is progressing and more pleasant.  A: pt provided support and encouragement. Pt given medication per protocol and standing orders. Q13m safety checks implemented and continued.  R: pt safe on the unit. Will continue to monitor.  Pt progressing in the following metrics  Problem: Education: Goal: Knowledge of [place] [facility] Education information/materials will improve Outcome: Progressing   Problem: Education: Goal: Will be free of psychotic symptoms Outcome: Progressing   Problem: Coping: Goal: Coping ability will improve Outcome: Progressing   Problem: Role Relationship: Goal: Ability to communicate needs accurately will improve Outcome: Progressing

## 2019-03-28 NOTE — Progress Notes (Signed)
Recreation Therapy Notes  Date: 5.1.20 Time: 1000 Location: 500 Hall Dayroom  Group Topic: Communication  Goal Area(s) Addresses:  Patient will effectively communicate with peers in group.  Patient will verbalize benefit of healthy communication. Patient will verbalize positive effect of healthy communication on post d/c goals.  Patient will identify communication techniques that made activity effective for group.   Behavioral Response: Engaged  Intervention:  Plain paper, pencils, drawings   Activity: Geometrical Drawings.  Patients took turns describing a geometrical drawing to the group.  The speaker could describe the picture in as much detail as needed.  The patients who were drawing could only ask the speaker to repeat themselves.  They could not ask any detailed questions.  At the completion of each drawing, patients would compare their drawing to the original picture.  Education:Communication, Discharge Planning  Education Outcome: Acknowledges understanding/In group clarification offered/Needs additional education.   Clinical Observations/Feedback:  Pt was appropriate during group.  Pt was appreciative of her peer starting over when he made a mistake.  Pt was also pleased with other peer giving good instructions.  Pt explained that eye contact and the way people move are a part of communication.    [name], LRT/CTRS     [name] A 03/28/2019 12:35 PM

## 2019-03-28 NOTE — Progress Notes (Signed)
[place] NOVEL CORONAVIRUS (COVID-19) DAILY CHECK-OFF SYMPTOMS - answer yes or no to each - every day NO YES  Have you had a fever in the past 24 hours?  . Fever (Temp > 37.80C / 100F) X   Have you had any of these symptoms in the past 24 hours? . New Cough .  Sore Throat  .  Shortness of Breath .  Difficulty Breathing .  Unexplained Body Aches   X   Have you had any one of these symptoms in the past 24 hours not related to allergies?   . Runny Nose .  Nasal Congestion .  Sneezing   X   If you have had runny nose, nasal congestion, sneezing in the past 24 hours, has it worsened?  X   EXPOSURES - check yes or no X   Have you traveled outside the state in the past 14 days?  X   Have you been in contact with someone with a confirmed diagnosis of COVID-19 or PUI in the past 14 days without wearing appropriate PPE?  X   Have you been living in the same home as a person with confirmed diagnosis of COVID-19 or a PUI (household contact)?    X   Have you been diagnosed with COVID-19?    X              What to do next: Answered NO to all: Answered YES to anything:   Proceed with unit schedule Follow the [facility] Inpatient Flowsheet.   

## 2019-03-28 NOTE — Progress Notes (Signed)
Nursing Progress Note: 7p-7a D: Pt currently presents with a labile affect and behavior. Pt states, "I want you to give me a pen so I can write letters for my church. I hope God puts worms in all your apples. I'm sorry for being mean but that's how I feel." Interacting minimally with the milieu. Pt reports good sleep during the previous night with current medication regimen.   A: Pt provided with medications per providers orders. Pt's labs and vitals were monitored throughout the night. Pt supported emotionally and encouraged to express concerns and questions. Pt educated on medications.  R: Pt's safety ensured with 15 minute and environmental checks. Pt currently denies SI, HI, and AVH. Pt verbally contracts to seek staff if SI,HI, or AVH occurs and to consult with staff before acting on any harmful thoughts. Will continue to monitor.

## 2019-03-28 NOTE — Tx Team (Signed)
Interdisciplinary Treatment Plan Update (Adult)  Date:  03/28/2019 Time Reviewed:  10:03 AM  Progress in Treatment: Attending groups: Yes. Participating in groups:  Yes. Taking medication as prescribed:  Yes. Tolerating medication:  Yes. Family/Significant othe contact made:  No, will contact:  Paula Dickson refused Paula Dickson understands diagnosis:  Yes. Discussing Paula Dickson identified problems/goals with staff:  Yes. Medical problems stabilized or resolved:  Yes. Denies suicidal/homicidal ideation: Yes. Issues/concerns per Paula Dickson self-inventory:  No. Other:  New problem(s) identified: No, Describe:  none  Discharge Plan or Barriers:  Reason for Continuation of Hospitalization: Aggression Medication stabilization  Comments:  Estimated length of stay:  New goal(s):  Review of initial/current Paula Dickson goals per problem list:   1.  Goal(s):  Met:  Yes  Target date:  As evidenced by:  2.  Goal (s):  Met:  NA  Target date:  As evidenced by:  3.  Goal(s):  Met:  NA  Target date:  As evidenced by:  4.  Goal(s):  Met:  NA  Target date:  As evidenced by:  Attendees: Paula Dickson:  03/28/2019   Physician: Johnn Hai, MD 03/28/2019   Nursing: Legrand Como, RN 03/28/2019   RN Care Manager: 03/28/2019   Social Worker: Winferd Humphrey, LCSW 03/28/2019   Recreational Therapist:  03/28/2019   Other: Ardelle Anton, LCSW 03/28/2019   Other:  03/28/2019   Other: 03/28/2019       Scribe for Treatment Team:   Trecia Rogers, 03/28/2019, 10:03 AM

## 2019-03-29 NOTE — Progress Notes (Signed)
The focus of this group is to help patients establish daily goals to achieve during treatment and discuss how the patient can incorporate goal setting into their daily lives to aide in recovery. 

## 2019-03-29 NOTE — Progress Notes (Signed)
Nursing Progress Note: 7p-7a D: Pt currently presents with a depressed/paranoid/anxious/labile affect and behavior. Pt states "I want to tell you something but everyone's attitude is terrible. So I am going back to room and not gonna talk to any of you all." Interacting minimally with the milieu. Pt reports off and on sleep during the previous night with current medication regimen.  A: Pt provided with medications per providers orders. Pt's labs and vitals were monitored throughout the night. Pt supported emotionally and encouraged to express concerns and questions. Pt educated on medications.  R: Pt's safety ensured with 15 minute and environmental checks. Pt currently denies SI, HI, and AVH. Pt verbally contracts to seek staff if SI,HI, or AVH occurs and to consult with staff before acting on any harmful thoughts. Will continue to monitor.

## 2019-03-29 NOTE — [facility] Group Notes (Signed)
  [facility] LCSW Group Therapy Note  Date/Time:  03/29/2019 11:15AM-12:00PM  Type of Therapy and Topic:  Group Therapy:  Feelings About Hospitalization  Participation Level:  Active   Description of Group This process group involved patients discussing their feelings related to being hospitalized, as well as the benefits they see to being in the hospital.  These feelings and benefits were itemized.  The group then brainstormed specific ways in which they could seek those same benefits when they discharge and return home.  Therapeutic Goals 1. Patient will identify and describe positive and negative feelings related to hospitalization 2. Patient will verbalize benefits of hospitalization to themselves personally 3. Patients will brainstorm together ways they can obtain similar benefits in the outpatient setting, identify barriers to wellness and possible solutions  Summary of Patient Progress:  The patient expressed her primary feelings about being hospitalized are not good.  She said "I'd like to talk to the doctor, but he runs away when I try to talk to him.  He even prepared it in advance, sent a letter to the judge.  The social worker is the same way."  She said she wants to go home soon and take care of her father.  She stated "Jesus writes my prescription" when asked if it would be helpful to take her medicine in order to stay out of the hospital at discharge.  Therapeutic Modalities Cognitive Behavioral Therapy Motivational Interviewing    Selmer Dominion, LCSW 03/29/2019, 9:17 AM

## 2019-03-29 NOTE — Progress Notes (Signed)
[place] NOVEL CORONAVIRUS (COVID-19) DAILY CHECK-OFF SYMPTOMS - answer yes or no to each - every day NO YES  Have you had a fever in the past 24 hours?  . Fever (Temp > 37.80C / 100F) X   Have you had any of these symptoms in the past 24 hours? . New Cough .  Sore Throat  .  Shortness of Breath .  Difficulty Breathing .  Unexplained Body Aches   X   Have you had any one of these symptoms in the past 24 hours not related to allergies?   . Runny Nose .  Nasal Congestion .  Sneezing   X   If you have had runny nose, nasal congestion, sneezing in the past 24 hours, has it worsened?  X   EXPOSURES - check yes or no X   Have you traveled outside the state in the past 14 days?  X   Have you been in contact with someone with a confirmed diagnosis of COVID-19 or PUI in the past 14 days without wearing appropriate PPE?  X   Have you been living in the same home as a person with confirmed diagnosis of COVID-19 or a PUI (household contact)?    X   Have you been diagnosed with COVID-19?    X              What to do next: Answered NO to all: Answered YES to anything:   Proceed with unit schedule Follow the [facility] Inpatient Flowsheet.   

## 2019-03-29 NOTE — Progress Notes (Signed)
[facility] MD Progress Note  03/29/2019 10:19 AM Paula Dickson  MRN:  [id number]    Subjective: Paula Dickson reported " I am feeling wonderful."   Evaluation:  Rashiya seen speaking on the telephone.  Patient presents pressured labile and irritable.  Has concerns regarding her discharge.  Continues to present with a slight tremor, patient reports she always has tremors when she gets upset.  Denies headache nausea vomiting or dizziness.  Speech continues to be pressured.  Chart reviewed: Labs ordered for free T3 and free T4.  LI level pending.  Reports a good appetite.  States she is resting well throughout the night.  Support, encouragement  And reassurance was provided.   Principal Problem: Bipolar I disorder, most recent episode manic, severe with psychotic features ([place]) Diagnosis: Principal Problem:   Bipolar I disorder, most recent episode manic, severe with psychotic features ([place]) Active Problems:   Schizoaffective disorder ([place])   Severe mixed bipolar 1 disorder without psychosis ([place])  Total Time spent with patient: 15 minutes  Past Psychiatric History:   Past Medical History:  Past Medical History:  Diagnosis Date  . Bipolar affective disorder ([place])   . History of arthritis   . History of chicken pox   . History of depression   . History of genital warts   . history of heart murmur   . History of high blood pressure   . History of thyroid disease   . History of UTI   . Hypertension   . Low TSH level 07/13/2017  . Schizophrenia [facility])     Past Surgical History:  Procedure Laterality Date  . ABLATION ON ENDOMETRIOSIS    . CYST REMOVAL NECK     around 11 years ago /benign  . MULTIPLE TOOTH EXTRACTIONS     Family History:  Family History  Problem Relation Age of Onset  . Arthritis Father   . Hyperlipidemia Father   . High blood pressure Father   . Diabetes Sister   . Diabetes Mother   . Diabetes Brother   . Mental illness Brother   . Alcohol abuse Paternal Uncle    . Alcohol abuse Paternal Grandfather   . Breast cancer Maternal Aunt   . Breast cancer Paternal Aunt   . High blood pressure Sister   . Mental illness Other        runs in family   Family Psychiatric  History:  Social History:  Social History   Substance and Sexual Activity  Alcohol Use No     Social History   Substance and Sexual Activity  Drug Use No    Social History   Socioeconomic History  . Marital status: Single    Spouse name: Not on file  . Number of children: Not on file  . Years of education: Not on file  . Highest education level: Not on file  Occupational History  . Not on file  Social Needs  . Financial resource strain: Not on file  . Food insecurity:    Worry: Not on file    Inability: Not on file  . Transportation needs:    Medical: Not on file    Non-medical: Not on file  Tobacco Use  . Smoking status: Never Smoker  . Smokeless tobacco: Never Used  Substance and Sexual Activity  . Alcohol use: No  . Drug use: No  . Sexual activity: Not Currently  Lifestyle  . Physical activity:    Days per week: Not on file  Minutes per session: Not on file  . Stress: Not on file  Relationships  . Social connections:    Talks on phone: Not on file    Gets together: Not on file    Attends religious service: Not on file    Active member of club or organization: Not on file    Attends meetings of clubs or organizations: Not on file    Relationship status: Not on file  Other Topics Concern  . Not on file  Social History Narrative  . Not on file   Additional Social History:    Pain Medications: See PTA medication list Prescriptions: See PTA medication list Over the Counter: See PTA medication list History of alcohol / drug use?: No history of alcohol / drug abuse(Pt denies.)                    Sleep: Fair  Appetite:  Fair  Current Medications: Current Facility-Administered Medications  Medication Dose Route Frequency Provider Last  Rate Last Dose  . benztropine (COGENTIN) tablet 1 mg  1 mg Oral BID Sharma Covert, MD   1 mg at 03/29/19 0801  . carvedilol (COREG) tablet 12.5 mg  12.5 mg Oral BID WC Sharma Covert, MD   12.5 mg at 03/29/19 0801  . clonazePAM (KLONOPIN) tablet 2 mg  2 mg Oral Once Johnn Hai, MD      . cloNIDine (CATAPRES) tablet 0.1 mg  0.1 mg Oral TID PRN Sharma Covert, MD      . haloperidol lactate (HALDOL) injection 10 mg  10 mg Intramuscular BID Sharma Covert, MD       Or  . haloperidol (HALDOL) tablet 10 mg  10 mg Oral BID Sharma Covert, MD   10 mg at 03/29/19 0801  . hydrOXYzine (ATARAX/VISTARIL) tablet 25 mg  25 mg Oral TID PRN Patriciaann Clan E, PA-C   25 mg at 03/27/19 2054  . lithium carbonate capsule 300 mg  300 mg Oral Daily Johnn Hai, MD   300 mg at 03/29/19 0801  . lithium carbonate capsule 600 mg  600 mg Oral QHS Johnn Hai, MD   600 mg at 03/28/19 2127  . LORazepam (ATIVAN) tablet 1 mg  1 mg Oral Q6H PRN Derrill Center, NP       Or  . LORazepam (ATIVAN) injection 1 mg  1 mg Intramuscular Q6H PRN Derrill Center, NP   1 mg at 03/20/19 0156  . LORazepam (ATIVAN) injection 4 mg  4 mg Intramuscular Once Johnn Hai, MD      . temazepam (RESTORIL) capsule 30 mg  30 mg Oral QHS Hampton Abbot, MD   30 mg at 03/28/19 2128    Lab Results:  No results found for this or any previous visit (from the past 68 hour(s)).  Blood Alcohol level:  Lab Results  Component Value Date   [facility] <10 10/31/2018   ETH <5 04/54/0981    Metabolic Disorder Labs: Lab Results  Component Value Date   HGBA1C 5.6 03/14/2019   MPG 114.02 03/14/2019   MPG 108.28 11/03/2018   Lab Results  Component Value Date   PROLACTIN 11.0 03/14/2019   Lab Results  Component Value Date   CHOL 152 03/14/2019   TRIG 120 03/14/2019   HDL 50 03/14/2019   CHOLHDL 3.0 03/14/2019   VLDL 24 03/14/2019   LDLCALC 78 03/14/2019   LDLCALC 76 11/03/2018    Physical Findings: AIMS: Facial and Oral  Movements Muscles of Facial Expression: None, normal Lips and Perioral Area: None, normal Jaw: None, normal Tongue: None, normal,Extremity Movements Upper (arms, wrists, hands, fingers): None, normal Lower (legs, knees, ankles, toes): None, normal, Trunk Movements Neck, shoulders, hips: None, normal, Overall Severity Severity of abnormal movements (highest score from questions above): None, normal Incapacitation due to abnormal movements: None, normal Patient's awareness of abnormal movements (rate only patient's report): No Awareness, Dental Status Current problems with teeth and/or dentures?: No Does patient usually wear dentures?: No  CIWA:  CIWA-Ar Total: 0 COWS:  COWS Total Score: 3  Musculoskeletal: Strength & Muscle Tone: within normal limits Gait & Station: normal Patient leans: N/A  Psychiatric Specialty Exam: Physical Exam  Constitutional: She is oriented to person, place, and time.  Neurological: She is alert and oriented to person, place, and time.  Psychiatric: She has a normal mood and affect.    Review of Systems  Psychiatric/Behavioral: Positive for depression and hallucinations. The patient is nervous/anxious.   All other systems reviewed and are negative.   Blood pressure (!) 153/86, pulse 72, temperature 98.1 F (36.7 C), temperature source Oral, resp. rate 20, SpO2 93 %.There is no height or weight on file to calculate BMI.  General Appearance: Casual  Eye Contact:  Fair  Speech:  Clear and Coherent and Pressured  Volume:  Normal  Mood:  Anxious  Affect:  Congruent and Labile  Thought Process:  Coherent  Orientation:  Full (Time, Place, and Person)  Thought Content:  Logical  Suicidal Thoughts:  No  Homicidal Thoughts:  No  Memory:  Immediate;   Fair Recent;   Fair  Judgement:  Fair  Insight:  Fair  Psychomotor Activity:  Normal  Concentration:  Concentration: Fair  Recall:  [employer] of Knowledge:  Fair  Language:  Fair  Akathisia:  No   Handed:  Right  AIMS (if indicated):     Assets:  Communication Skills Desire for Improvement Resilience Social Support  ADL's:  Intact  Cognition:  WNL  Sleep:  Number of Hours: 5.5     Treatment Plan Summary: Daily contact with patient to assess and evaluate symptoms and progress in treatment and Medication management   Continue with current treatment plan on 03/29/2019 as listed below except were noted  Bipolar disorder :  Continue lithium 300 mg p.o. and 600 mg QHS  - li level 0.43  Discontinue Cogentin Discontinued Trintellix  Discontinue Prolixin  Insomnia: Continue Restoril 30 mg p.o. nightly  HTN:  Continue Coreg 12 mg p.o. twice daily   CSW to continue working on discharge disposition Patient encouraged to participate throughout the milieu    Derrill Center, NP 03/29/2019, 10:19 AM

## 2019-03-29 NOTE — Progress Notes (Signed)
Pt presents with an anxious mood. Pt rates depression 1/10. Anxiety 0. Pt denies SI/HI.Pt denies AVH.  Pt reported fair sleep last night. Pt noted to be less agitated and argumentative today. Pt compliant with taking meds and denies any side effects.   Medications reviewed with pt. Verbal support provided. Pt encouraged to attend groups. 15 minute checks performed for safety.  Pt compliant with tx plan.

## 2019-03-30 LAB — LITHIUM LEVEL: Lithium Lvl: 0.74 mmol/L (ref 0.60–1.20)

## 2019-03-30 LAB — T4, FREE: Free T4: 1.09 ng/dL (ref 0.82–1.77)

## 2019-03-30 NOTE — Plan of Care (Signed)
D: Patient is alert and cooperative. Denies SI, HI, AVH, and verbally contracts for safety. She reports a good day. Patient is preoccupied with her laundry and her clothes. She starts to be argumentative but is redirectable. Patient denies physical symptoms/pain.    A: Medications administered per MD order. Support provided. Patient educated on safety on the unit and medications. Routine safety checks every 15 minutes. Patient stated understanding to tell nurse about any new physical symptoms. Patient understands to tell staff of any needs.     R: No adverse drug reactions noted. Patient verbally contracts for safety. Patient remains safe at this time and will continue to monitor.   Problem: Education: Goal: Emotional status will improve Outcome: Progressing   Patient reports a good day. Denies SI, HI, AVH, and contracts for safety. Patient remains safe and will continue to monitor.   [place] NOVEL CORONAVIRUS (COVID-19) DAILY CHECK-OFF SYMPTOMS - answer yes or no to each - every day NO YES  Have you had a fever in the past 24 hours?  . Fever (Temp > 37.80C / 100F) X   Have you had any of these symptoms in the past 24 hours? . New Cough .  Sore Throat  .  Shortness of Breath .  Difficulty Breathing .  Unexplained Body Aches   X   Have you had any one of these symptoms in the past 24 hours not related to allergies?   . Runny Nose .  Nasal Congestion .  Sneezing   X   If you have had runny nose, nasal congestion, sneezing in the past 24 hours, has it worsened?  X   EXPOSURES - check yes or no X   Have you traveled outside the state in the past 14 days?  X   Have you been in contact with someone with a confirmed diagnosis of COVID-19 or PUI in the past 14 days without wearing appropriate PPE?  X   Have you been living in the same home as a person with confirmed diagnosis of COVID-19 or a PUI (household contact)?    X   Have you been diagnosed with COVID-19?    X               What to do next: Answered NO to all: Answered YES to anything:   Proceed with unit schedule Follow the [facility] Inpatient Flowsheet.

## 2019-03-30 NOTE — [facility] Group Notes (Signed)
[facility] LCSW Group Therapy Note  Date/Time:  03/30/2019  11:00AM-12:00PM  Type of Therapy and Topic:  Group Therapy:  Music and Mood  Participation Level:  Minimal   Description of Group: In this process group, members listened to a variety of genres of music and identified that different types of music evoke different responses.  Patients were encouraged to identify music that was soothing for them and music that was energizing for them.  Patients discussed how this knowledge can help with wellness and recovery in various ways including managing depression and anxiety as well as encouraging healthy sleep habits.    Therapeutic Goals: 1. Patients will explore the impact of different varieties of music on mood 2. Patients will verbalize the thoughts they have when listening to different types of music 3. Patients will identify music that is soothing to them as well as music that is energizing to them 4. Patients will discuss how to use this knowledge to assist in maintaining wellness and recovery 5. Patients will explore the use of music as a coping skill  Summary of Patient Progress:  At the beginning of group, patient was not present.  She was in and out of the room several times.  She asked questions about the names of artists when present, but ultimately left the room and did not return.  Therapeutic Modalities: Solution Focused Brief Therapy Activity   Selmer Dominion, LCSW

## 2019-03-30 NOTE — Progress Notes (Addendum)
[facility] MD Progress Note  03/30/2019 10:02 AM Paula Dickson  MRN:  [id number]    Evaluation:Paula Dickson observed interacting with peers.  She is awake, alert and oriented x3.  Patient continues to request to be discharged soon as she states she has appointments later on in the week.  Lithium level 0.74.  Paula Dickson continues to be labile.  Labs pending for T3/T4.  Patient observed attending daily group session with active and engaged participation.  Continues to deny suicidal or homicidal ideations.  Denies auditory visual hallucinations.  Paula Dickson rates her depression 1 out of 10 with 10 being the worst. Support, encouragement and  reassurance was provided.   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 Bipolar affective disorder ([place])   . History of arthritis   . History of chicken pox   . History of depression   . History of genital warts   . history of heart murmur   . History of high blood pressure   . History of thyroid disease   . History of UTI   . Hypertension   . Low TSH level 07/13/2017  . Schizophrenia [facility])     Past Surgical History:  Procedure Laterality Date  . ABLATION ON ENDOMETRIOSIS    . CYST REMOVAL NECK     around 11 years ago /benign  . MULTIPLE TOOTH EXTRACTIONS     Family History:  Family History  Problem Relation Age of Onset  . Arthritis Father   . Hyperlipidemia Father   . High blood pressure Father   . Diabetes Sister   . Diabetes Mother   . Diabetes Brother   . Mental illness Brother   . Alcohol abuse Paternal Uncle   . Alcohol abuse  Paternal Grandfather   . Breast cancer Maternal Aunt   . Breast cancer Paternal Aunt   . High blood pressure Sister   . Mental illness Other        runs in family   Family Psychiatric  History:  Social History:  Social History   Substance and Sexual Activity  Alcohol Use No     Social History   Substance and Sexual Activity  Drug Use No    Social History   Socioeconomic History  . Marital status: Single    Spouse name: Not on file  . Number of children: Not on file  . Years of education: Not on file  . Highest education level: Not on file  Occupational History  . Not on file  Social Needs  . Financial resource strain: Not on file  . Food insecurity:    Worry: Not on file    Inability: Not on file  . Transportation needs:    Medical: Not on file    Non-medical: Not on file  Tobacco Use  . Smoking status: Never Smoker  . Smokeless tobacco: Never Used  Substance and Sexual Activity  . Alcohol use: No  . Drug use: No  . Sexual activity: Not Currently  Lifestyle  . Physical activity:    Days per week: Not on file    Minutes per session: Not on  file  . Stress: Not on file  Relationships  . Social connections:    Talks on phone: Not on file    Gets together: Not on file    Attends religious service: Not on file    Active member of club or organization: Not on file    Attends meetings of clubs or organizations: Not on file    Relationship status: Not on file  Other Topics Concern  . Not on file  Social History Narrative  . Not on file   Additional Social History:    Pain Medications: See PTA medication list Prescriptions: See PTA medication list Over the Counter: See PTA medication list History of alcohol / drug use?: No history of alcohol / drug abuse(Pt denies.)                    Sleep: Fair  Appetite:  Fair  Current Medications: Current Facility-Administered Medications  Medication Dose Route Frequency Provider Last Rate Last Dose  .  carvedilol (COREG) tablet 12.5 mg  12.5 mg Oral BID WC Sharma Covert, MD   12.5 mg at 03/30/19 0742  . clonazePAM (KLONOPIN) tablet 2 mg  2 mg Oral Once Johnn Hai, MD      . cloNIDine (CATAPRES) tablet 0.1 mg  0.1 mg Oral TID PRN Sharma Covert, MD      . haloperidol lactate (HALDOL) injection 10 mg  10 mg Intramuscular BID Sharma Covert, MD       Or  . haloperidol (HALDOL) tablet 10 mg  10 mg Oral BID Sharma Covert, MD   10 mg at 03/30/19 7322  . hydrOXYzine (ATARAX/VISTARIL) tablet 25 mg  25 mg Oral TID PRN Laverle Hobby, PA-C   25 mg at 03/27/19 2054  . lithium carbonate capsule 300 mg  300 mg Oral Daily Johnn Hai, MD   300 mg at 03/30/19 0254  . lithium carbonate capsule 600 mg  600 mg Oral QHS Johnn Hai, MD   600 mg at 03/29/19 2158  . LORazepam (ATIVAN) tablet 1 mg  1 mg Oral Q6H PRN Derrill Center, NP       Or  . LORazepam (ATIVAN) injection 1 mg  1 mg Intramuscular Q6H PRN Derrill Center, NP   1 mg at 03/20/19 0156  . LORazepam (ATIVAN) injection 4 mg  4 mg Intramuscular Once Johnn Hai, MD      . temazepam (RESTORIL) capsule 30 mg  30 mg Oral QHS Hampton Abbot, MD   30 mg at 03/29/19 2158    Lab Results:  Results for orders placed or performed during the hospital encounter of 03/13/19 (from the past 48 hour(s))  Lithium level     Status: None   Collection Time: 03/30/19  6:19 AM  Result Value Ref Range   Lithium Lvl 0.74 0.60 - 1.20 mmol/L    Comment: Performed at [facility], [place] [address], [place], [place] [address]    Blood Alcohol level:  Lab Results  Component Value Date   [facility] <10 10/31/2018   ETH <5 37/62/8315    Metabolic Disorder Labs: Lab Results  Component Value Date   HGBA1C 5.6 03/14/2019   MPG 114.02 03/14/2019   MPG 108.28 11/03/2018   Lab Results  Component Value Date   PROLACTIN 11.0 03/14/2019   Lab Results  Component Value Date   CHOL 152 03/14/2019   TRIG 120 03/14/2019   HDL 50  03/14/2019   CHOLHDL  3.0 03/14/2019   VLDL 24 03/14/2019   LDLCALC 78 03/14/2019   LDLCALC 76 11/03/2018    Physical Findings: AIMS: Facial and Oral Movements Muscles of Facial Expression: None, normal Lips and Perioral Area: None, normal Jaw: None, normal Tongue: None, normal,Extremity Movements Upper (arms, wrists, hands, fingers): None, normal Lower (legs, knees, ankles, toes): None, normal, Trunk Movements Neck, shoulders, hips: None, normal, Overall Severity Severity of abnormal movements (highest score from questions above): None, normal Incapacitation due to abnormal movements: None, normal Patient's awareness of abnormal movements (rate only patient's report): No Awareness, Dental Status Current problems with teeth and/or dentures?: No Does patient usually wear dentures?: No  CIWA:  CIWA-Ar Total: 0 COWS:  COWS Total Score: 3  Musculoskeletal: Strength & Muscle Tone: within normal limits Gait & Station: normal Patient leans: N/A  Psychiatric Specialty Exam: Physical Exam  Constitutional: She is oriented to person, place, and time.  Neurological: She is alert and oriented to person, place, and time.  Psychiatric: She has a normal mood and affect.    Review of Systems  Psychiatric/Behavioral: Positive for depression and hallucinations. The patient is nervous/anxious.   All other systems reviewed and are negative.   Blood pressure 139/89, pulse 79, temperature 97.7 F (36.5 C), temperature source Oral, resp. rate 20, SpO2 93 %.There is no height or weight on file to calculate BMI.  General Appearance: Casual  Eye Contact:  Fair  Speech:  Clear and Coherent and Pressured  Volume:  Normal  Mood:  Anxious  Affect:  Congruent and Labile  Thought Process:  Coherent  Orientation:  Full (Time, Place, and Person)  Thought Content:  Logical  Suicidal Thoughts:  No  Homicidal Thoughts:  No  Memory:  Immediate;   Fair Recent;   Fair  Judgement:  Fair  Insight:  Fair   Psychomotor Activity:  Normal  Concentration:  Concentration: Fair  Recall:  [employer] of Knowledge:  Fair  Language:  Fair  Akathisia:  No  Handed:  Right  AIMS (if indicated):     Assets:  Communication Skills Desire for Improvement Resilience Social Support  ADL's:  Intact  Cognition:  WNL  Sleep:  Number of Hours: 4.25     Treatment Plan Summary: Daily contact with patient to assess and evaluate symptoms and progress in treatment and Medication management   Continue with current treatment plan on 03/30/2019 as listed below except were noted  Bipolar disorder :  Continue lithium 300 mg p.o. and 600 mg QHS  - li level 0.74  Discontinue Cogentin Discontinued Trintellix  Discontinue Prolixin  Insomnia: Continue Restoril 30 mg p.o. nightly  HTN:  -Continue Coreg 12 mg p.o. twice daily -Inderal 120 mg  was discontinued due to elevated creatinine level.  Downward trend noted.  CSW to continue working on discharge disposition Patient encouraged to participate throughout the milieu    Derrill Center, NP 03/30/2019, 10:02 AM  Attest to NP Progress Note

## 2019-03-30 NOTE — Progress Notes (Signed)
DAR NOTE: Patient presents with calm affect and pleasant mood.  Denies pain, auditory and visual hallucinations. Pt stated she has been having a good day and waiting to be told when she is discharging  Pt stated she had a good sleep, good appetite, normal energy, and good concentration. Rates depression at 1, hopelessness at 0, and anxiety at 0.  Maintained on routine safety checks.  Medications given as prescribed.  Support and encouragement offered as needed.  Attended group and participated.  States goal for today is " talking with the doctor" .  Patient observed socializing with peers in the dayroom.  Offered no complaint.

## 2019-03-31 LAB — T3, FREE: T3, Free: 3.6 pg/mL (ref 2.0–4.4)

## 2019-03-31 NOTE — [facility] Group Notes (Signed)
[place] LCSW Group Therapy Note  Date/Time: 03/31/19, 1315  Type of Therapy and Topic:  Group Therapy:  Overcoming Obstacles  Participation Level:  active  Description of Group:    In this group patients will be encouraged to explore what they see as obstacles to their own wellness and recovery. They will be guided to discuss their thoughts, feelings, and behaviors related to these obstacles. The group will process together ways to cope with barriers, with attention given to specific choices patients can make. Each patient will be challenged to identify changes they are motivated to make in order to overcome their obstacles. This group will be process-oriented, with patients participating in exploration of their own experiences as well as giving and receiving support and challenge from other group members.  Therapeutic Goals: 1. Patient will identify personal and current obstacles as they relate to admission. 2. Patient will identify barriers that currently interfere with their wellness or overcoming obstacles.  3. Patient will identify feelings, thought process and behaviors related to these barriers. 4. Patient will identify two changes they are willing to make to overcome these obstacles:    Summary of Patient Progress: pt appropriate in group, stating that "other people" are her obstacle, but also her mental health diagnosis.  Appropriate participation and comments today.      Therapeutic Modalities:   Cognitive Behavioral Therapy Solution Focused Therapy Motivational Interviewing Relapse Prevention Therapy  Lurline Idol, LCSW

## 2019-03-31 NOTE — Plan of Care (Signed)
D: Patient is alert, oriented, pleasant, and cooperative. Denies SI, HI, AVH, and verbally contracts for safety. Patient reports a good day. Patient is preoccupied with her laundry but communicates her concerns appropriately. Patient denies physical symptoms/pain.    A: Medications administered per MD order. Support provided. Patient educated on safety on the unit and medications. Routine safety checks every 15 minutes. Patient stated understanding to tell nurse about any new physical symptoms. Patient understands to tell staff of any needs.     R: No adverse drug reactions noted. Patient verbally contracts for safety. Patient remains safe at this time and will continue to monitor.   Problem: Education: Goal: Emotional status will improve Outcome: Progressing   Problem: Role Relationship: Goal: Ability to communicate needs accurately will improve Outcome: Progressing   Patient reports a good day and appropriately asks for what she needs/wants.  [place] NOVEL CORONAVIRUS (COVID-19) DAILY CHECK-OFF SYMPTOMS - answer yes or no to each - every day NO YES  Have you had a fever in the past 24 hours?  Fever (Temp > 37.80C / 100F) X   Have you had any of these symptoms in the past 24 hours? New Cough  Sore Throat   Shortness of Breath  Difficulty Breathing  Unexplained Body Aches   X   Have you had any one of these symptoms in the past 24 hours not related to allergies?   Runny Nose  Nasal Congestion  Sneezing   X   If you have had runny nose, nasal congestion, sneezing in the past 24 hours, has it worsened?  X   EXPOSURES - check yes or no X   Have you traveled outside the state in the past 14 days?  X   Have you been in contact with someone with a confirmed diagnosis of COVID-19 or PUI in the past 14 days without wearing appropriate PPE?  X   Have you been living in the same home as a person with confirmed diagnosis of COVID-19 or a PUI (household contact)?    X   Have you been  diagnosed with COVID-19?    X              What to do next: Answered NO to all: Answered YES to anything:   Proceed with unit schedule Follow the [facility] Inpatient Flowsheet.

## 2019-03-31 NOTE — Progress Notes (Signed)
Recreation Therapy Notes  Date: 5.4.20 Time: 1000 Location: 500 Hall Dayroom  Group Topic: Anxiety  Goal Area(s) Addresses:  Patient will identify triggers for anxiety. Patient will identify physical symptoms of anxiety Patient will identify coping skills for anxiety.  Behavioral Response: Engaged  Intervention:  Worksheet, pencils   Activity: Introduction to Anxiety.  Patients were to identify their triggers, physical symptoms, thoughts and coping skills for dealing with anxiety.  Education: Communication, Discharge Planning  Education Outcome: Acknowledges understanding/In group clarification offered/Needs additional education.   Clinical Observations/Feedback:  Pt identified triggers as unanswered questions, family that doesn't have best interests and people asking questions they already know answers to.  Pt identified physical symptoms as sweating, feeling faint and racing heart beat.  Pt stated her thoughts were "am I of value" and "does God care".  Pt identified coping skills as reading magazines, reading bible, music and sending cards to people.    [name], LRT/CTRS     [name] A 03/31/2019 11:28 AM

## 2019-03-31 NOTE — Progress Notes (Signed)
[facility] MD Progress Note  03/31/2019 10:05 AM Paula Dickson  MRN:  [id number] Subjective:   Finally less manic she is more accepting of the need for medication and more compliant overall she denies wanting to harm self or others and can contract fully we probably will discharge her tomorrow.  No change in meds otherwise.  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 Bipolar affective disorder ([place])   . History of arthritis   . History of chicken pox   . History of depression   . History of genital warts   . history of heart murmur   . History of high blood pressure   . History of thyroid disease   . History of UTI   . Hypertension   . Low TSH level 07/13/2017  . Schizophrenia [facility])     Past Surgical History:  Procedure Laterality Date  . ABLATION ON ENDOMETRIOSIS    . CYST REMOVAL NECK     around 11 years ago /benign  . MULTIPLE TOOTH EXTRACTIONS     Family History:  Family History  Problem Relation Age of Onset  . Arthritis Father   . Hyperlipidemia Father   . High blood pressure Father   . Diabetes Sister   . Diabetes Mother   . Diabetes Brother   . Mental illness Brother   . Alcohol abuse Paternal Uncle   . Alcohol abuse Paternal Grandfather   . Breast cancer Maternal Aunt   . Breast cancer Paternal Aunt   . High blood pressure Sister   . Mental illness Other        runs in family   Family Psychiatric  History: neg Social History:  Social History   Substance and Sexual Activity  Alcohol Use No     Social History   Substance and Sexual Activity  Drug Use No     Social History   Socioeconomic History  . Marital status: Single    Spouse name: Not on file  . Number of children: Not on file  . Years of education: Not on file  . Highest education level: Not on file  Occupational History  . Not on file  Social Needs  . Financial resource strain: Not on file  . Food insecurity:    Worry: Not on file    Inability: Not on file  . Transportation needs:    Medical: Not on file    Non-medical: Not on file  Tobacco Use  . Smoking status: Never Smoker  . Smokeless tobacco: Never Used  Substance and Sexual Activity  . Alcohol use: No  . Drug use: No  . Sexual activity: Not Currently  Lifestyle  . Physical activity:    Days per week: Not on file    Minutes per session: Not on file  . Stress: Not on file  Relationships  . Social connections:    Talks on phone: Not on file    Gets together: Not on file    Attends religious service: Not on file    Active member of club or organization: Not on file    Attends meetings of clubs  or organizations: Not on file    Relationship status: Not on file  Other Topics Concern  . Not on file  Social History Narrative  . Not on file   Additional Social History:    Pain Medications: See PTA medication list Prescriptions: See PTA medication list Over the Counter: See PTA medication list History of alcohol / drug use?: No history of alcohol / drug abuse(Pt denies.)                    Sleep: Fair  Appetite:  Fair  Current Medications: Current Facility-Administered Medications  Medication Dose Route Frequency Provider Last Rate Last Dose  . carvedilol (COREG) tablet 12.5 mg  12.5 mg Oral BID WC Sharma Covert, MD   12.5 mg at 03/31/19 0750  . clonazePAM (KLONOPIN) tablet 2 mg  2 mg Oral Once Johnn Hai, MD      . cloNIDine (CATAPRES) tablet 0.1 mg  0.1 mg Oral TID PRN Sharma Covert, MD      . haloperidol lactate (HALDOL) injection 10 mg  10 mg Intramuscular BID Sharma Covert, MD       Or  . haloperidol (HALDOL) tablet 10 mg  10 mg Oral BID Sharma Covert, MD   10 mg at 03/31/19 0750  . hydrOXYzine (ATARAX/VISTARIL) tablet 25 mg  25 mg Oral TID PRN Patriciaann Clan E, PA-C   25 mg at 03/27/19 2054  . lithium carbonate capsule 300 mg  300 mg Oral Daily Johnn Hai, MD   300 mg at 03/31/19 0750  . lithium carbonate capsule 600 mg  600 mg Oral QHS Johnn Hai, MD   600 mg at 03/30/19 2129  . LORazepam (ATIVAN) tablet 1 mg  1 mg Oral Q6H PRN Derrill Center, NP       Or  . LORazepam (ATIVAN) injection 1 mg  1 mg Intramuscular Q6H PRN Derrill Center, NP   1 mg at 03/20/19 0156  . LORazepam (ATIVAN) injection 4 mg  4 mg Intramuscular Once Johnn Hai, MD      . temazepam (RESTORIL) capsule 30 mg  30 mg Oral QHS Hampton Abbot, MD   30 mg at 03/30/19 2129    Lab Results:  Results for orders placed or performed during the hospital encounter of 03/13/19 (from the past 48 hour(s))  T3, free     Status: None   Collection Time: 03/30/19  6:19 AM  Result Value Ref Range   T3, Free 3.6 2.0 - 4.4 pg/mL    Comment: (NOTE) Performed At: [facility] [place], [place] [id number] Rush Farmer MD [id number]   T4, free     Status: None   Collection Time: 03/30/19  6:19 AM  Result Value Ref Range   Free T4 1.09 0.82 - 1.77 ng/dL    Comment: (NOTE) Biotin ingestion may interfere with free T4 tests. If the results are inconsistent with the TSH level, previous test results, or the clinical presentation, then consider biotin interference. If needed, order repeat testing after stopping biotin. Performed at [facility], [place] [address], [place], [place] [address]   Lithium level     Status: None   Collection Time: 03/30/19  6:19 AM  Result Value Ref Range   Lithium Lvl 0.74 0.60 - 1.20 mmol/L    Comment: Performed at [facility], [place] [address], [place], [place] [address]    Blood Alcohol level:  Lab  Results  Component Value Date   ETH <10 10/31/2018   ETH <5 44/62/8638    Metabolic Disorder Labs: Lab Results  Component Value Date   HGBA1C 5.6 03/14/2019   MPG 114.02 03/14/2019   MPG 108.28 11/03/2018   Lab Results  Component Value Date   PROLACTIN 11.0 03/14/2019   Lab Results  Component Value Date   CHOL 152 03/14/2019   TRIG 120 03/14/2019   HDL 50 03/14/2019   CHOLHDL 3.0 03/14/2019   VLDL 24 03/14/2019   LDLCALC 78 03/14/2019   LDLCALC 76 11/03/2018    Physical Findings: AIMS: Facial and Oral Movements Muscles of Facial Expression: None, normal Lips and Perioral Area: None, normal Jaw: None, normal Tongue: None, normal,Extremity Movements Upper (arms, wrists, hands, fingers): None, normal Lower (legs, knees, ankles, toes): None, normal, Trunk Movements Neck, shoulders, hips: None, normal, Overall Severity Severity of abnormal movements (highest score from questions above): None, normal Incapacitation due to abnormal movements: None, normal Patient's awareness of abnormal movements (rate only patient's report): No Awareness, Dental Status Current problems with teeth and/or dentures?: No Does patient usually wear dentures?: No  CIWA:  CIWA-Ar Total: 0 COWS:  COWS Total Score: 3  Musculoskeletal: Strength & Muscle Tone: within normal limits Gait & Station: normal Patient leans: N/A  Psychiatric Specialty Exam: Physical Exam  ROS  Blood pressure (!) 153/95, pulse 82, temperature 99.4 F (37.4 C), temperature source Oral, resp. rate 20, SpO2 93 %.There is no height or weight on file to calculate BMI.  General Appearance: nl  Eye Contact:  Good  Speech:  Clear and Coherent  Volume:  Normal  Mood:  Euthymic  Affect:  Appropriate and Constricted  Thought Process:  Coherent and Linear  Orientation:  Full (Time, Place, and Person)  Thought Content:  Logical and Tangential  Suicidal Thoughts:  No  Homicidal Thoughts:  No  Memory:  Immediate;   Fair   Judgement:  Fair  Insight:  Fair  Psychomotor Activity:  Normal  Concentration:  Concentration: Fair  Recall:  [employer] of Knowledge:  Fair  Language:  Fair  Akathisia:  Negative  Handed:  Right  AIMS (if indicated):     Assets:  Physical Health Social Support  ADL's:  Intact  Cognition:  WNL  Sleep:  Number of Hours: 4.5     Treatment Plan Summary: Daily contact with patient to assess and evaluate symptoms and progress in treatment, Medication management and Plan No change in meds continue current therapies probable discharge in the morning.  Continue reality-based therapy and current med regimen  Stacyann Mcconaughy, MD 03/31/2019, 10:05 AM

## 2019-03-31 NOTE — Progress Notes (Signed)
Progress note  Pt found in the dayroom; compliant with medication administration. Pt is still anxious but redirectable and more pleasant. Pt denies any physical pain or symptoms. Pt provided support and encouragement. Pt given medication per protocol and standing orders. q9m safety checks implemented and continued. Will continue to monitor.

## 2019-04-01 MED ORDER — BENZTROPINE MESYLATE 1 MG PO TABS
1.0000 mg | ORAL_TABLET | Freq: Two times a day (BID) | ORAL | Status: DC
  Filled 2019-04-01 (×4): qty 1

## 2019-04-01 MED ORDER — CLONIDINE HCL 0.1 MG PO TABS
0.1000 mg | ORAL_TABLET | Freq: Two times a day (BID) | ORAL | Status: DC
  Filled 2019-04-01 (×4): qty 1

## 2019-04-01 MED ORDER — LITHIUM CARBONATE 300 MG PO CAPS: ORAL_CAPSULE | ORAL | 1 refills | Status: DC

## 2019-04-01 MED ORDER — BENZTROPINE MESYLATE 1 MG PO TABS: 1.0000 mg | ORAL_TABLET | Freq: Two times a day (BID) | ORAL | 1 refills | Status: DC

## 2019-04-01 MED ORDER — CLONIDINE HCL 0.1 MG PO TABS: 0.1000 mg | ORAL_TABLET | Freq: Two times a day (BID) | ORAL | 11 refills | Status: DC

## 2019-04-01 MED ORDER — CARVEDILOL 25 MG PO TABS
25.0000 mg | ORAL_TABLET | Freq: Two times a day (BID) | ORAL | Status: DC
  Filled 2019-04-01 (×4): qty 1

## 2019-04-01 MED ORDER — HALOPERIDOL 10 MG PO TABS: 20.0000 mg | ORAL_TABLET | Freq: Every day | ORAL | 2 refills | Status: DC

## 2019-04-01 MED ORDER — TEMAZEPAM 30 MG PO CAPS: 30.0000 mg | ORAL_CAPSULE | Freq: Every day | ORAL | 1 refills | Status: DC

## 2019-04-01 MED ORDER — CARVEDILOL 25 MG PO TABS: 25.0000 mg | ORAL_TABLET | Freq: Two times a day (BID) | ORAL | 2 refills | Status: DC

## 2019-04-01 NOTE — [facility] Suicide Risk Assessment (Signed)
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  Patients presenting with no risk factors but with morbid ruminations; may be classified as minimal risk based on the severity of the depressive symptoms  Follow-up Information    [name] Follow up on 04/15/2019.   Why:  Hosptial follow up appointment with Dr.Alemu is Tuesday, 5/19 at 12:00p.  The appointment will be held over the phone and the provider will contact you.  Contact information: [address] [place] [address] [phone]           Plan Of Care/Follow-up recommendations:  Activity:  full  Wrenn Willcox, MD 04/01/2019, 11:01 AM

## 2019-04-01 NOTE — [facility] Group Notes (Signed)
[facility] LCSW Group Therapy Note  Date/Time: 04/01/2019 11am  Type of Therapy/Topic:  Group Therapy:  Feelings about Diagnosis  Participation Level:  Active   Mood: Pleasant/Talkative   Description of Group:    This group will allow patients to explore their thoughts and feelings about diagnoses they have received. Patients will be guided to explore their level of understanding and acceptance of these diagnoses. Facilitator will encourage patients to process their thoughts and feelings about the reactions of others to their diagnosis, and will guide patients in identifying ways to discuss their diagnosis with significant others in their lives. This group will be process-oriented, with patients participating in exploration of their own experiences as well as giving and receiving support and challenge from other group members.   Therapeutic Goals: 1. Patient will demonstrate understanding of diagnosis as evidence by identifying two or more symptoms of the disorder:  2. Patient will be able to express two feelings regarding the diagnosis 3. Patient will demonstrate ability to communicate their needs through discussion and/or role plays  Summary of Patient Progress:    Pt was talkative and engaged throughout group. Pt was able to discuss her thoughts and feelings around having a diagnosis. Pt was able to identify her own diagnoses and expressed stigma that she has personally received due to have her diagnosis. Pt was able to identify coping skills that has helped her cope with her diagnoses.    Therapeutic Modalities:   Cognitive Behavioral Therapy Brief Therapy Feelings Identification   Ardelle Anton, LCSW

## 2019-04-01 NOTE — Discharge Summary (Signed)
Physician Discharge Summary Note  Patient:  Paula Dickson is an 53 y.o., female MRN:  [id number] DOB:  1965-12-14 Patient phone:  [phone] (home)  Patient address:   [place] [address],  Total Time spent with patient: 45 minutes  Date of Admission:  03/13/2019 Date of Discharge: 04/16/2019  Reason for Admission:    [name] is well-known to the service was last here in December and discharged actually on December 23, she is 53 years of age and suffers from a chronic schizoaffective/bipolar type condition.  Though her last exacerbation involve depression delusion and near catatonic symptoms at times, this exacerbation involves mania, yelling, threatening behaviors, and she is intensely focused on her sister, the petitioner, and her anger is mainly directed at her and any healthcare provider who is examining her. Petition indicates she is off of her medication and destroying property at the home she was described as "labile loud agitated aggressive yelling at staff and doctors" and refusing medications.  Insisting she believes in a higher power and did not need them.  She was put on an empty wart in the child's section for a brief period of time because she was simply so disruptive that we felt she would be very corrosive to the mail you particular since we have manic patients already on the ward refusing medications and receiving forced medications.  She received IM Geodon and Ativan yesterday and did improve somewhat and she has become more compliant with her medications.  This morning she remains angry and irritable focused on her sisters misdeeds and her sisters lack of support of her father so forth stating her sister "tells lies on her" and everybody in the church knows it.  She is also overheard on the phone argumentative with sister or and/or other family members presumably.  At this point she is semicooperative with the interview process and the team meeting but  irritability is the main manifestation now.  Somewhat paranoid denying hallucinations somewhat insulted by the more basic questions of the mental status exam so it is as complete as it can be.  No EPS or TD   Principal Problem: Bipolar I disorder, most recent episode manic, severe with psychotic features [facility]) Discharge Diagnoses: Principal Problem:   Bipolar I disorder, most recent episode manic, severe with psychotic features ([place]) Active Problems:   Schizoaffective disorder ([place])   Severe mixed bipolar 1 disorder without psychosis ([place])   Past Psychiatric History: extensive  Past Medical History:  Past Medical History:  Diagnosis Date  . Bipolar affective disorder ([place])   . History of arthritis   . History of chicken pox   . History of depression   . History of genital warts   . history of heart murmur   . History of high blood pressure   . History of thyroid disease   . History of UTI   . Hypertension   . Low TSH level 07/13/2017  . Schizophrenia [facility])     Past Surgical History:  Procedure Laterality Date  . ABLATION ON ENDOMETRIOSIS    . CYST REMOVAL NECK     around 11 years ago /benign  . MULTIPLE TOOTH EXTRACTIONS     Family History:  Family History  Problem Relation Age of Onset  . Arthritis Father   . Hyperlipidemia Father   . High blood pressure Father   . Diabetes Sister   . Diabetes Mother   . Diabetes Brother   . Mental illness Brother   .  Alcohol abuse Paternal Uncle   . Alcohol abuse Paternal Grandfather   . Breast cancer Maternal Aunt   . Breast cancer Paternal Aunt   . High blood pressure Sister   . Mental illness Other        runs in family   Family Psychiatric  History: neg Social History:  Social History   Substance and Sexual Activity  Alcohol Use No     Social History   Substance and Sexual Activity  Drug Use No    Social History   Socioeconomic History  . Marital status: Single    Spouse name: Not on file  . Number of  children: Not on file  . Years of education: Not on file  . Highest education level: Not on file  Occupational History  . Not on file  Social Needs  . Financial resource strain: Not on file  . Food insecurity:    Worry: Not on file    Inability: Not on file  . Transportation needs:    Medical: Not on file    Non-medical: Not on file  Tobacco Use  . Smoking status: Never Smoker  . Smokeless tobacco: Never Used  Substance and Sexual Activity  . Alcohol use: No  . Drug use: No  . Sexual activity: Not Currently  Lifestyle  . Physical activity:    Days per week: Not on file    Minutes per session: Not on file  . Stress: Not on file  Relationships  . Social connections:    Talks on phone: Not on file    Gets together: Not on file    Attends religious service: Not on file    Active member of club or organization: Not on file    Attends meetings of clubs or organizations: Not on file    Relationship status: Not on file  Other Topics Concern  . Not on file  Social History Narrative  . Not on file    Hospital Course:    Again Chena is well-known to the service and though we had recently seen her in a near catatonic state that this episode was marked by mania, irritability argumentative behavior and refusal of medications.  We worked with her as well as we could and as long as we could to encourage compliance before we finally instituted a forced medication order.  We utilize Haldol simply because it was 1 she would take preferentially after much discussion of risk benefits and side effects but this still did not guarantee compliance.  At any rate at doses of 10 to 20 mg a day, in addition to her lithium, she finally did stabilize it is noted though that we did have to give IM Haldol for missing oral doses. Her hospital course was marked by intrusive argumentative behavior refusal of meds and mania, however she eventually did stabilize.  Further she had some drug-induced parkinsonism  that went away when she was compliant with benztropine. By the date of the fifth she had finally recalibrated what we think is her baseline and that she is alert and euthymic she is no longer irritable argumentative and disruptive she is cooperative she was certainly attention seeking and would pretty much try and engage me in an interview or have numerous questions every time she saw me on the ward, to the point of even interrupting while I was chatting with other patients in their rooms at any rate she had a decrease in these behaviors until they were finally diminished  by the point of discharge. So discharge she is best described as euthymic cooperative and compliant without acute psychosis without acute mania without thoughts of harming self or others contracting fully, no EPS or TD  Physical Findings: AIMS: Facial and Oral Movements Muscles of Facial Expression: None, normal Lips and Perioral Area: None, normal Jaw: None, normal Tongue: None, normal,Extremity Movements Upper (arms, wrists, hands, fingers): None, normal Lower (legs, knees, ankles, toes): None, normal, Trunk Movements Neck, shoulders, hips: None, normal, Overall Severity Severity of abnormal movements (highest score from questions above): None, normal Incapacitation due to abnormal movements: None, normal Patient's awareness of abnormal movements (rate only patient's report): No Awareness, Dental Status Current problems with teeth and/or dentures?: No Does patient usually wear dentures?: No  CIWA:  CIWA-Ar Total: 0 COWS:  COWS Total Score: 3  Musculoskeletal: Strength & Muscle Tone: within normal limits Gait & Station: normal Patient leans: N/A  Psychiatric Specialty Exam: Physical Exam  ROS  Blood pressure (!) 145/93, pulse 79, temperature 98.3 F (36.8 C), temperature source Oral, resp. rate 20, SpO2 93 %.There is no height or weight on file to calculate BMI.  General Appearance: Casual  Eye Contact:  Good   Speech:  Clear and Coherent  Volume:  Normal  Mood:  Euthymic  Affect:  Appropriate and Congruent  Thought Process:  Goal Directed and Linear  Orientation:  Full (Time, Place, and Person)  Thought Content:  Logical and Tangential  Suicidal Thoughts:  No  Homicidal Thoughts:  No  Memory:  Recent;   Fair  Judgement:  Fair  Insight:  Fair  Psychomotor Activity:  Normal  Concentration:  Concentration: Good  Recall:  Good  Fund of Knowledge:  Good  Language:  Good  Akathisia:  Negative  Handed:  Right  AIMS (if indicated):     Assets:  Agricultural consultant Housing Intimacy  ADL's:  Intact  Cognition:  WNL  Sleep:  Number of Hours: 4.25     Have you used any form of tobacco in the last 30 days? (Cigarettes, Smokeless Tobacco, Cigars, and/or Pipes): No  Has this patient used any form of tobacco in the last 30 days? (Cigarettes, Smokeless Tobacco, Cigars, and/or Pipes) Yes, No  Blood Alcohol level:  Lab Results  Component Value Date   ETH <10 10/31/2018   ETH <5 76/22/6333    Metabolic Disorder Labs:  Lab Results  Component Value Date   HGBA1C 5.6 03/14/2019   MPG 114.02 03/14/2019   MPG 108.28 11/03/2018   Lab Results  Component Value Date   PROLACTIN 11.0 03/14/2019   Lab Results  Component Value Date   CHOL 152 03/14/2019   TRIG 120 03/14/2019   HDL 50 03/14/2019   CHOLHDL 3.0 03/14/2019   VLDL 24 03/14/2019   LDLCALC 78 03/14/2019   LDLCALC 76 11/03/2018    See Psychiatric Specialty Exam and Suicide Risk Assessment completed by Attending Physician prior to discharge.  Discharge destination:  Home  Is patient on multiple antipsychotic therapies at discharge:  No   Has Patient had three or more failed trials of antipsychotic monotherapy by history:  No  Recommended Plan for Multiple Antipsychotic Therapies: NA   Allergies as of 04/01/2019   No Known Allergies     Medication List    STOP taking these medications    amLODipine 10 MG tablet Commonly known as:  NORVASC   OXcarbazepine 150 MG tablet Commonly known as:  TRILEPTAL   propranolol ER 80 MG 24  hr capsule Commonly known as:  INDERAL LA   QUEtiapine 400 MG tablet Commonly known as:  SEROQUEL   vortioxetine HBr 5 MG Tabs tablet Commonly known as:  TRINTELLIX     TAKE these medications     Indication  benztropine 1 MG tablet Commonly known as:  COGENTIN Take 1 tablet (1 mg total) by mouth 2 (two) times daily.  Indication:  Extrapyramidal Reaction caused by Medications   carvedilol 25 MG tablet Commonly known as:  COREG Take 1 tablet (25 mg total) by mouth 2 (two) times daily with a meal.  Indication:  High Blood Pressure Disorder   cloNIDine 0.1 MG tablet Commonly known as:  CATAPRES Take 1 tablet (0.1 mg total) by mouth 2 (two) times daily.  Indication:  High Blood Pressure Disorder   haloperidol 10 MG tablet Commonly known as:  HALDOL Take 2 tablets (20 mg total) by mouth at bedtime.  Indication:  Hypomanic Episode of Bipolar Disorder   lithium carbonate 300 MG capsule 1 in am 2 at hs What changed:    how much to take  how to take this  when to take this  additional instructions  Indication:  Depression   temazepam 30 MG capsule Commonly known as:  RESTORIL Take 1 capsule (30 mg total) by mouth at bedtime.  Indication:  Trouble Sleeping      Follow-up Information    [name] Follow up on 04/15/2019.   Why:  Hosptial follow up appointment with Dr.Alemu is Tuesday, 5/19 at 12:00p.  The appointment will be held over the phone and the provider will contact you.  Contact information: [address] [place] [address] [phone]          SignedJohnn Hai, MD 04/01/2019, 11:02 AM

## 2019-04-01 NOTE — Plan of Care (Signed)
Pt engaged in groups in a calm and appropriate mood at completion of recreation therapy group sessions.   [name], LRT/CTRS

## 2019-04-01 NOTE — Progress Notes (Signed)
Recreation Therapy Notes  Date: 5.5.20 Time: 1000 Location: [address] Dayroom  Group Topic: Wellness  Goal Area(s) Addresses:  Patient will define components of whole wellness. Patient will verbalize benefit of whole wellness.  Behavioral Response: Engaged  Intervention: Fitness  Activity:  Exercise.  LRT led group in [place] series of stretches before going into chair and some standing exercises.  Patients were to follow along with the exercises presented in group.  Patients could take breaks and get water as needed.  Education: Wellness, Dentist.   Education Outcome: Acknowledges education/In group clarification offered/Needs additional education.   Clinical Observations/Feedback: Pt was bright, social and active throughout group session.  Pt completed all exercises but took the precautions needed for exercises that may have been Dickson little more challenging.  Pt expressed she was going to take these exercises with her because she now has Dickson workout routine.    [name], LRT/CTRS    Paula Dickson, Paula Dickson 04/01/2019 10:45 AM

## 2019-04-01 NOTE — Progress Notes (Signed)
Recreation Therapy Notes  INPATIENT RECREATION TR PLAN  Patient Details Name: Paula Dickson [place] MRN: [id number] DOB: May 31, 1966 Today's Date: 04/01/2019  Rec Therapy Plan Is patient appropriate for Therapeutic Recreation?: Yes Treatment times per week: about 3 days Estimated Length of Stay: 5-7 days TR Treatment/Interventions: Group participation (Comment)  Discharge Criteria Pt will be discharged from therapy if:: Discharged Treatment plan/goals/alternatives discussed and agreed upon by:: Patient/family  Discharge Summary Short term goals set: See patient care plan Short term goals met: Complete Progress toward goals comments: Groups attended Which groups?: Self-esteem, Wellness, Communication, Goal setting, Coping skills, Other (Comment)(Triggers, Anxiety, Team building) Reason goals not met: None Therapeutic equipment acquired: N/A Reason patient discharged from therapy: Discharge from hospital Pt/family agrees with progress & goals achieved: Yes Date patient discharged from therapy: 04/01/19    [name], LRT/CTRS  Ria Comment, [place] 04/01/2019, 11:10 AM

## 2019-04-01 NOTE — Progress Notes (Signed)
  [facility] Adult Case Management Discharge Plan :  Will you be returning to the same living situation after discharge:  Yes,  with father At discharge, do you have transportation home?: Yes,  sister Do you have the ability to pay for your medications: Yes,  medicare/medicaid  Release of information consent forms completed and in the chart;  Patient's signature needed at discharge.  Patient to Follow up at: Follow-up Information    [name] Follow up on 04/15/2019.   Why:  Hosptial follow up appointment with Dr.Alemu is Tuesday, 5/19 at 12:00p.  The appointment will be held over the phone and the provider will contact you.  Contact information: [place] [address] [phone]           Next level of care provider has access to [place] and Suicide Prevention discussed: No. Pt declined consent.   Have you used any form of tobacco in the last 30 days? (Cigarettes, Smokeless Tobacco, Cigars, and/or Pipes): No  Has patient been referred to the Quitline?: N/A patient is not a smoker  Patient has been referred for addiction treatment: N/A  Joanne Chars, LCSW 04/01/2019, 10:24 AM

## 2019-04-01 NOTE — Progress Notes (Signed)
Pt d/c from the hospital with her sister. All items returned. D/C instructions given, and prescriptions given. Pt denies si and hi.

## 2019-06-13 IMAGING — MG DIGITAL DIAGNOSTIC UNILATERAL LEFT MAMMOGRAM WITH TOMO AND CAD
6 series · 6 of 18 positions shown · non-contrast
Comparison: Previous exam(s).

CLINICAL DATA: 52-year-old female recalled from screening mammogram
dated 10/03/2018 for a possible left breast asymmetry.

EXAM:
DIGITAL DIAGNOSTIC UNILATERAL LEFT MAMMOGRAM WITH CAD AND TOMO

[L CC synth-2D]
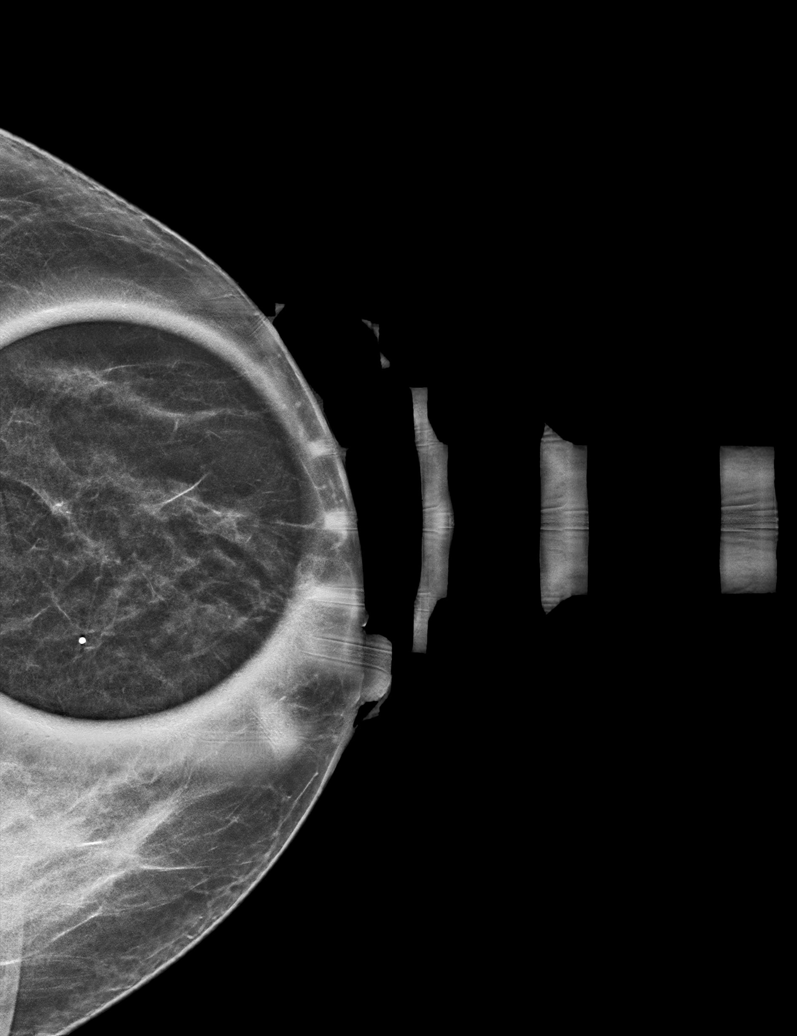

[L ML synth-2D (1 of 2)]
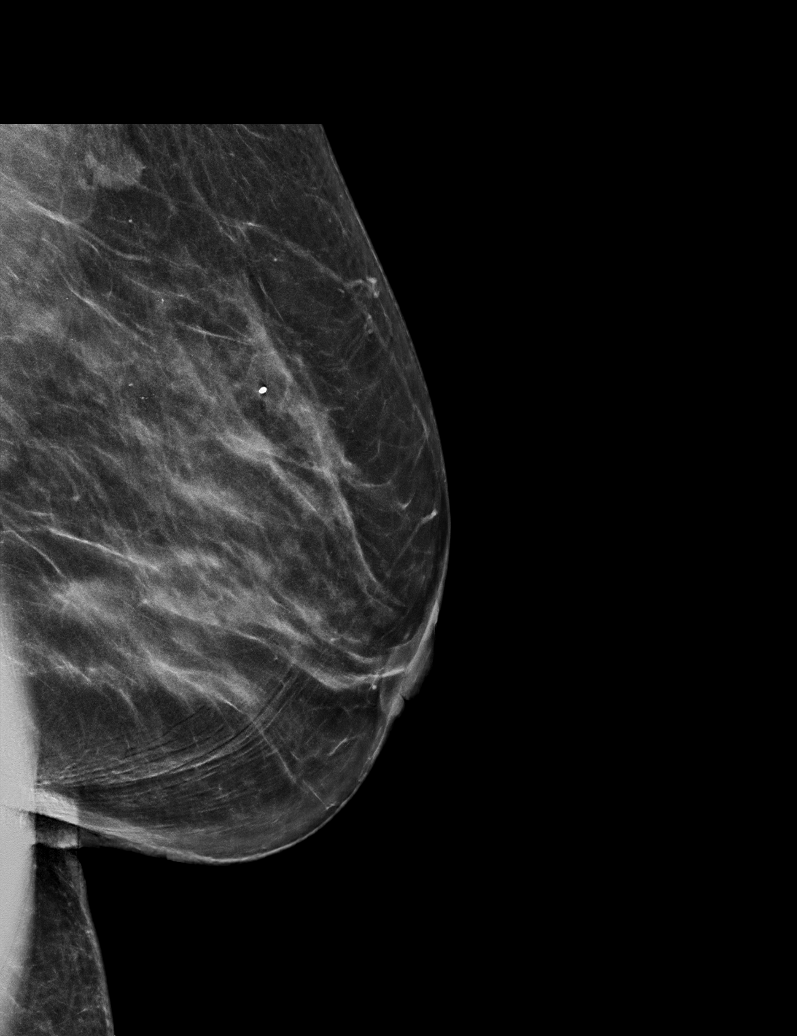

[L ML synth-2D (2 of 2)]
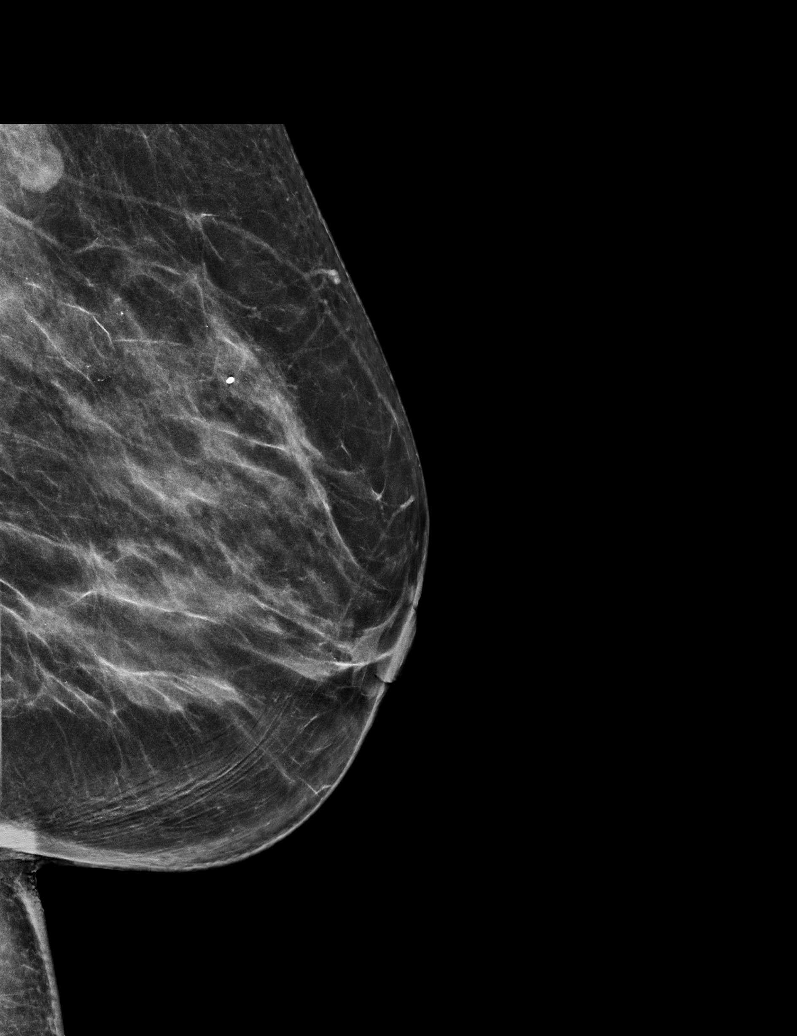

[L ML tomo (1 of 2) · tomo slice 33/65.0]
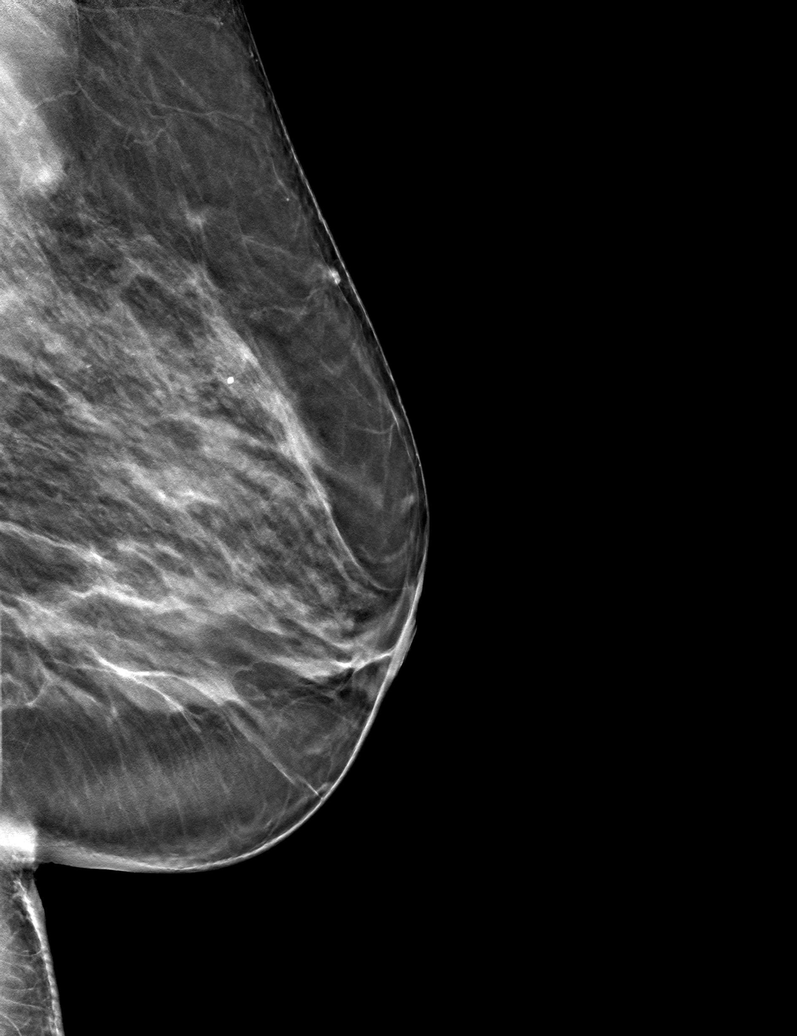

[L CC tomo · tomo slice 24/47.0]
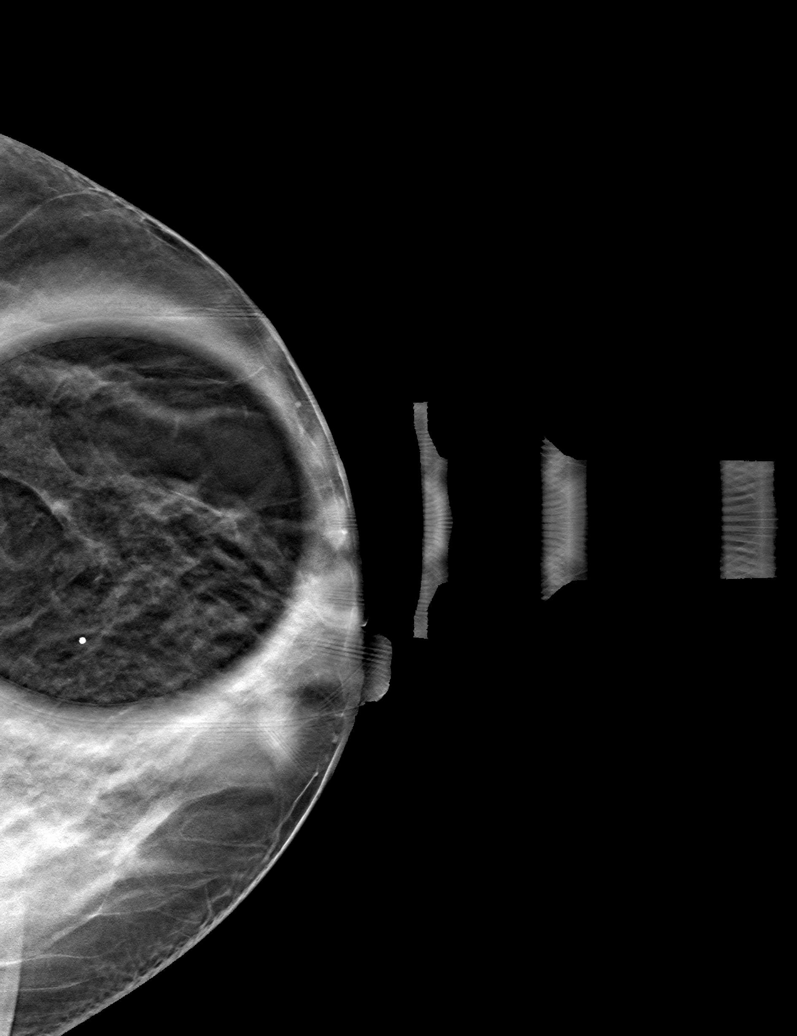

[L ML tomo (2 of 2) · tomo slice 37/73.0]
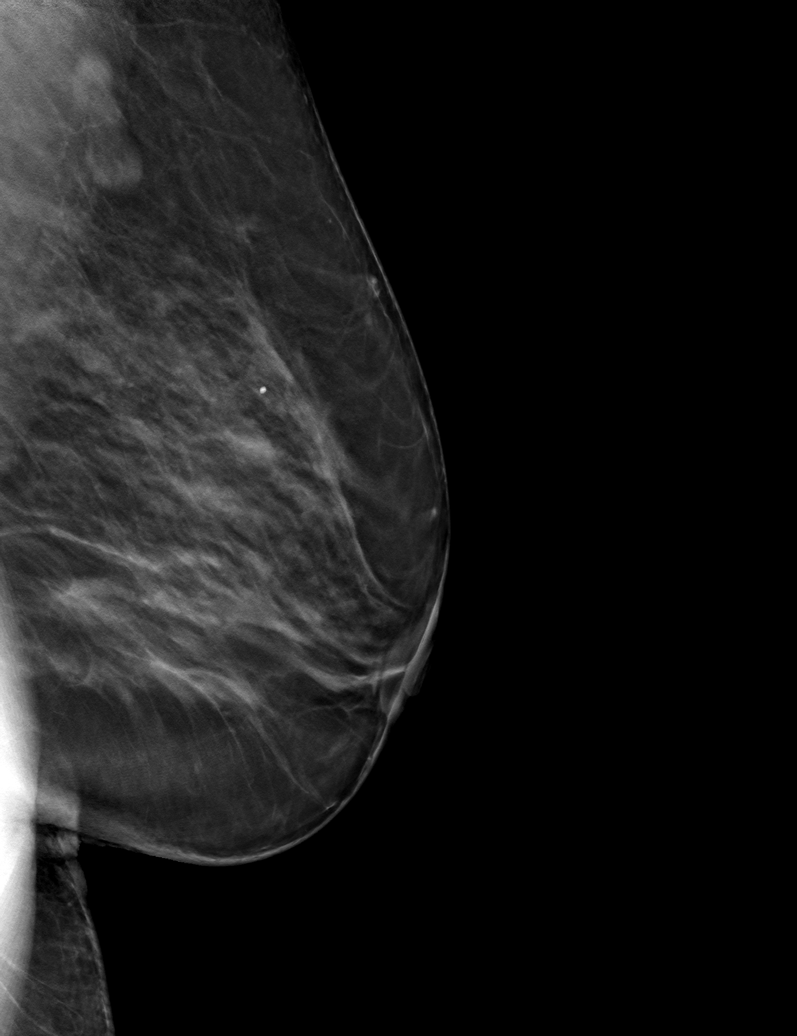

[6 of 18 positions shown; findings below may reference images not displayed]

ACR Breast Density Category c: The breast tissue is heterogeneously
dense, which may obscure small masses.
FINDINGS: Previously described, possible asymmetry in the slightly lateral
left breast at middle depth on the CC projection only does not
persist on today's additional mammographic views. No suspicious
findings are identified.

Mammographic images were processed with CAD.
IMPRESSION: No mammographic evidence of malignancy.

RECOMMENDATION:
Screening mammogram in one year.(Code:[id number]-Y-BV0)

I have discussed the findings and recommendations with the patient.
Results were also provided in writing at the conclusion of the
visit. If applicable, a reminder letter will be sent to the patient
regarding the next appointment.

BI-RADS CATEGORY  1: Negative.

## 2019-07-30 ENCOUNTER — Other Ambulatory Visit: Payer: Self-pay

## 2019-07-30 ENCOUNTER — Emergency Department (HOSPITAL_COMMUNITY)
Admission: EM | Admit: 2019-07-30 | Discharge: 2019-07-31 | Disposition: A | Discharge status: Home / Self Care | Payer: Medicare Other | Attending: Emergency Medicine | Admitting: Emergency Medicine

## 2019-07-30 ENCOUNTER — Encounter (HOSPITAL_COMMUNITY): Payer: Self-pay | Admitting: Emergency Medicine

## 2019-07-30 DIAGNOSIS — R7989 Other specified abnormal findings of blood chemistry: Secondary | ICD-10-CM | POA: Insufficient documentation

## 2019-07-30 DIAGNOSIS — F3160 Bipolar disorder, current episode mixed, unspecified: Secondary | ICD-10-CM | POA: Diagnosis present

## 2019-07-30 DIAGNOSIS — Y636 Underdosing and nonadministration of necessary drug, medicament or biological substance: Secondary | ICD-10-CM | POA: Diagnosis not present

## 2019-07-30 DIAGNOSIS — R251 Tremor, unspecified: Secondary | ICD-10-CM | POA: Insufficient documentation

## 2019-07-30 DIAGNOSIS — T50905A Adverse effect of unspecified drugs, medicaments and biological substances, initial encounter: Secondary | ICD-10-CM | POA: Diagnosis not present

## 2019-07-30 DIAGNOSIS — T887XXA Unspecified adverse effect of drug or medicament, initial encounter: Secondary | ICD-10-CM | POA: Insufficient documentation

## 2019-07-30 DIAGNOSIS — R410 Disorientation, unspecified: Secondary | ICD-10-CM | POA: Diagnosis not present

## 2019-07-30 DIAGNOSIS — Z79899 Other long term (current) drug therapy: Secondary | ICD-10-CM | POA: Insufficient documentation

## 2019-07-30 DIAGNOSIS — Z9114 Patient's other noncompliance with medication regimen: Secondary | ICD-10-CM | POA: Diagnosis not present

## 2019-07-30 DIAGNOSIS — Z046 Encounter for general psychiatric examination, requested by authority: Secondary | ICD-10-CM | POA: Diagnosis not present

## 2019-07-30 DIAGNOSIS — I10 Essential (primary) hypertension: Secondary | ICD-10-CM | POA: Insufficient documentation

## 2019-07-30 DIAGNOSIS — F209 Schizophrenia, unspecified: Secondary | ICD-10-CM | POA: Diagnosis present

## 2019-07-30 LAB — ACETAMINOPHEN LEVEL: Acetaminophen (Tylenol), Serum: <10 ug/mL — ABNORMAL LOW (ref 10–30)

## 2019-07-30 LAB — BASIC METABOLIC PANEL
Anion gap: 8 (ref 5–15)
BUN: 14 mg/dL (ref 6–20)
CO2: 24 mmol/L (ref 22–32)
Calcium: 9.9 mg/dL (ref 8.9–10.3)
Chloride: 110 mmol/L (ref 98–111)
Creatinine, Ser: 1.24 mg/dL — ABNORMAL HIGH (ref 0.44–1.00)
GFR calc Af Amer: 57 mL/min — ABNORMAL LOW (ref >60)
GFR calc non Af Amer: 49 mL/min — ABNORMAL LOW (ref >60)
Glucose, Bld: 132 mg/dL — ABNORMAL HIGH (ref 70–99)
Potassium: 4 mmol/L (ref 3.5–5.1)
Sodium: 142 mmol/L (ref 135–145)

## 2019-07-30 LAB — COMPREHENSIVE METABOLIC PANEL
ALT: 25 U/L (ref 0–44)
AST: 14 U/L — ABNORMAL LOW (ref 15–41)
Albumin: 4.8 g/dL (ref 3.5–5.0)
Alkaline Phosphatase: 170 U/L — ABNORMAL HIGH (ref 38–126)
Anion gap: 13 (ref 5–15)
BUN: 15 mg/dL (ref 6–20)
CO2: 25 mmol/L (ref 22–32)
Calcium: 11.4 mg/dL — ABNORMAL HIGH (ref 8.9–10.3)
Chloride: 105 mmol/L (ref 98–111)
Creatinine, Ser: 1.33 mg/dL — ABNORMAL HIGH (ref 0.44–1.00)
GFR calc Af Amer: 52 mL/min — ABNORMAL LOW (ref >60)
GFR calc non Af Amer: 45 mL/min — ABNORMAL LOW (ref >60)
Glucose, Bld: 149 mg/dL — ABNORMAL HIGH (ref 70–99)
Potassium: 3.8 mmol/L (ref 3.5–5.1)
Sodium: 143 mmol/L (ref 135–145)
Total Bilirubin: 1.2 mg/dL (ref 0.3–1.2)
Total Protein: 8.2 g/dL — ABNORMAL HIGH (ref 6.5–8.1)

## 2019-07-30 LAB — CBC WITH DIFFERENTIAL/PLATELET
Abs Immature Granulocytes: 0.06 10*3/uL (ref 0.00–0.07)
Basophils Absolute: 0.1 10*3/uL (ref 0.0–0.1)
Basophils Relative: 1 %
Eosinophils Absolute: 0 10*3/uL (ref 0.0–0.5)
Eosinophils Relative: 0 %
HCT: 47.5 % — ABNORMAL HIGH (ref 36.0–46.0)
Hemoglobin: 15.7 g/dL — ABNORMAL HIGH (ref 12.0–15.0)
Immature Granulocytes: 1 %
Lymphocytes Relative: 16 %
Lymphs Abs: 1.9 10*3/uL (ref 0.7–4.0)
MCH: 29.1 pg (ref 26.0–34.0)
MCHC: 33.1 g/dL (ref 30.0–36.0)
MCV: 88 fL (ref 80.0–100.0)
Monocytes Absolute: 0.7 10*3/uL (ref 0.1–1.0)
Monocytes Relative: 6 %
Neutro Abs: 9.3 10*3/uL — ABNORMAL HIGH (ref 1.7–7.7)
Neutrophils Relative %: 76 %
Platelets: 274 10*3/uL (ref 150–400)
RBC: 5.4 MIL/uL — ABNORMAL HIGH (ref 3.87–5.11)
RDW: 13.5 % (ref 11.5–15.5)
WBC: 12 10*3/uL — ABNORMAL HIGH (ref 4.0–10.5)
nRBC: 0 % (ref 0.0–0.2)

## 2019-07-30 LAB — ETHANOL: Alcohol, Ethyl (B): <10 mg/dL (ref <10)

## 2019-07-30 LAB — SALICYLATE LEVEL: Salicylate Lvl: <7 mg/dL (ref 2.8–30.0)

## 2019-07-30 LAB — LITHIUM LEVEL: Lithium Lvl: 1.03 mmol/L (ref 0.60–1.20)

## 2019-07-30 LAB — HCG, QUANTITATIVE, PREGNANCY: hCG, Beta Chain, Quant, S: 2 m[IU]/mL (ref <5)

## 2019-07-30 MED ORDER — CLONIDINE HCL 0.1 MG PO TABS
0.1000 mg | ORAL_TABLET | Freq: Once | ORAL | Status: AC
  Administered 2019-07-30: 0.1 mg via ORAL
  Filled 2019-07-30: qty 1

## 2019-07-30 MED ORDER — SODIUM CHLORIDE 0.9 % IV BOLUS
1000.0000 mL | Freq: Once | INTRAVENOUS | Status: AC
  Administered 2019-07-30: 1000 mL via INTRAVENOUS

## 2019-07-30 NOTE — ED Triage Notes (Signed)
EMS states pt has been noncompliant with medication: clonidine, lithium, and temazepam. Pt has had tremors for several days. EMS states pt has had some confusion.  BP 160/90 HR 90 RR 18 SpO2 98 on RA Temp 98.1 CBG 230

## 2019-07-30 NOTE — ED Provider Notes (Signed)
[facility] DEPT Provider Note   CSN: [id number] Arrival date & time: 07/30/19  1302     History   Chief Complaint Chief Complaint  Patient presents with  . Tremors    HPI Paula Dickson is a 53 y.o. female.     HPI   Level 5 caveat due to psychiatric condition.  Paula Dickson is a 53 y.o. female, with a history of bipolar affective disorder, HTN, schizophrenia, presenting to the ED with suspected psychiatric decompensation. Patient states, "my sister tells me I am not taking my medications correctly."  Patient primarily complains of tremor, but cannot give any further details. She will start a sentence and then not finish it. She denies recent illness, fever/chills, chest pain, shortness of breath, abdominal pain, falls/trauma, N/V/D, or any other complaints.    Past Medical History:  Diagnosis Date  . Bipolar affective disorder ([place])   . History of arthritis   . History of chicken pox   . History of depression   . History of genital warts   . history of heart murmur   . History of high blood pressure   . History of thyroid disease   . History of UTI   . Hypertension   . Low TSH level 07/13/2017  . Schizophrenia [facility])     Patient Active Problem List   Diagnosis Date Noted  . Schizoaffective disorder ([place]) 03/13/2019  . Severe mixed bipolar 1 disorder without psychosis ([place]) 03/13/2019  . Bipolar affective disorder, current episode mixed ([place]) 11/01/2018  . Toxic encephalopathy 07/14/2017  . Suicide attempt ([place]) 07/13/2017  . Low TSH level 07/13/2017  . Overdose of psychotropic 07/12/2017  . Valproic acid toxicity 07/12/2017  . Hyperammonemia ([place]) 07/12/2017  . Schizophrenia ([place]) 07/12/2017  . Bipolar disorder, most recent episode depressed ([place]) 07/12/2017  . Insomnia 12/27/2015  . Abnormal urinalysis 12/27/2015  . 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 Bipolar I disorder, most recent episode manic, severe  with psychotic features ([place]) 06/14/2015    Past Surgical History:  Procedure Laterality Date  . ABLATION ON ENDOMETRIOSIS    . CYST REMOVAL NECK     around 11 years ago /benign  . MULTIPLE TOOTH EXTRACTIONS       OB History   No obstetric history on file.      Home Medications    Prior to Admission medications   Medication Sig Start Date End Date Taking? Authorizing Provider  benztropine (COGENTIN) 1 MG tablet Take 1 tablet (1 mg total) by mouth 2 (two) times daily. 04/01/19  Yes Johnn Hai, MD  carvedilol (COREG) 25 MG tablet Take 1 tablet (25 mg total) by mouth 2 (two) times daily with a meal. 04/01/19  Yes Johnn Hai, MD  cloNIDine (CATAPRES) 0.1 MG tablet Take 1 tablet (0.1 mg total) by mouth 2 (two) times daily. 04/01/19  Yes Johnn Hai, MD  haloperidol (HALDOL) 10 MG tablet Take 2 tablets (20 mg total) by mouth at bedtime. 04/01/19  Yes Johnn Hai, MD  lithium carbonate 300 MG capsule 1 in am 2 at hs Patient taking differently: Take 300-600 mg by mouth 2 (two) times daily with a meal. 1 in am 2 at hs 04/01/19  Yes Johnn Hai, MD  temazepam (RESTORIL) 30 MG capsule Take 1 capsule (30 mg total) by mouth at bedtime. Patient not taking: Reported on 07/30/2019 04/01/19   Johnn Hai, MD    Family History Family History  Problem Relation Age of Onset  . Arthritis  Father   . Hyperlipidemia Father   . High blood pressure Father   . Diabetes Sister   . Diabetes Mother   . Diabetes Brother   . Mental illness Brother   . Alcohol abuse Paternal Uncle   . Alcohol abuse Paternal Grandfather   . Breast cancer Maternal Aunt   . Breast cancer Paternal Aunt   . High blood pressure Sister   . Mental illness Other        runs in family    Social History Social History   Tobacco Use  . Smoking status: Never Smoker  . Smokeless tobacco: Never Used  Substance Use Topics  . Alcohol use: No  . Drug use: No     Allergies   Patient has no known allergies.   Review of  Systems Review of Systems  Unable to perform ROS: Psychiatric disorder     Physical Exam Updated Vital Signs BP (!) 177/114   Pulse 88   Temp 99.5 F (37.5 C) (Oral)   Resp 16   Ht 5\' 1"  (1.549 m)   Wt 63.5 kg   SpO2 95%   BMI 26.45 kg/m   Physical Exam Vitals signs and nursing note reviewed.  Constitutional:      General: She is not in acute distress.    Appearance: She is well-developed. She is not diaphoretic.  HENT:     Head: Normocephalic and atraumatic.     Mouth/Throat:     Mouth: Mucous membranes are moist.     Pharynx: Oropharynx is clear.  Eyes:     Conjunctiva/sclera: Conjunctivae normal.  Neck:     Musculoskeletal: Neck supple.  Cardiovascular:     Rate and Rhythm: Normal rate and regular rhythm.     Pulses: Normal pulses.          Radial pulses are 2+ on the right side and 2+ on the left side.       Posterior tibial pulses are 2+ on the right side and 2+ on the left side.     Heart sounds: Normal heart sounds.     Comments: Tactile temperature in the extremities appropriate and equal bilaterally. Pulmonary:     Effort: Pulmonary effort is normal. No respiratory distress.     Breath sounds: Normal breath sounds.  Abdominal:     Palpations: Abdomen is soft.     Tenderness: There is no abdominal tenderness. There is no guarding.  Musculoskeletal:     Right lower leg: No edema.     Left lower leg: No edema.  Lymphadenopathy:     Cervical: No cervical adenopathy.  Skin:    General: Skin is warm and dry.  Neurological:     Mental Status: She is alert.     Comments: Sensation to light touch grossly intact and equal bilaterally in each of the extremities. Grip strength equal bilaterally. Patient noted to have a resting tremor, mostly in the extremities, bilaterally. Strength 5/5 in the extremities and equal bilaterally.  Psychiatric:        Mood and Affect: Mood and affect normal.        Speech: Speech normal.        Behavior: Behavior normal.       ED Treatments / Results  Labs (all labs ordered are listed, but only abnormal results are displayed) Labs Reviewed  COMPREHENSIVE METABOLIC PANEL - Abnormal; Notable for the following components:      Result Value   Glucose, Bld 149 (*)  Creatinine, Ser 1.33 (*)    Calcium 11.4 (*)    Total Protein 8.2 (*)    AST 14 (*)    Alkaline Phosphatase 170 (*)    GFR calc non Af Amer 45 (*)    GFR calc Af Amer 52 (*)    All other components within normal limits  CBC WITH DIFFERENTIAL/PLATELET - Abnormal; Notable for the following components:   WBC 12.0 (*)    RBC 5.40 (*)    Hemoglobin 15.7 (*)    HCT 47.5 (*)    Neutro Abs 9.3 (*)    All other components within normal limits  ACETAMINOPHEN LEVEL - Abnormal; Notable for the following components:   Acetaminophen (Tylenol), Serum <10 (*)    All other components within normal limits  SALICYLATE LEVEL  LITHIUM LEVEL  ETHANOL  HCG, QUANTITATIVE, PREGNANCY  RAPID URINE DRUG SCREEN, HOSP PERFORMED  BASIC METABOLIC PANEL    BUN  Date Value Ref Range Status  07/30/2019 15 6 - 20 mg/dL Final  03/24/2019 21 (H) 6 - 20 mg/dL Final  03/14/2019 17 6 - 20 mg/dL Final  11/16/2018 14 6 - 20 mg/dL Final   Creatinine, Ser  Date Value Ref Range Status  07/30/2019 1.33 (H) 0.44 - 1.00 mg/dL Final  03/24/2019 1.04 (H) 0.44 - 1.00 mg/dL Final  03/14/2019 1.24 (H) 0.44 - 1.00 mg/dL Final  11/16/2018 1.17 (H) 0.44 - 1.00 mg/dL Final     EKG EKG Interpretation  Date/Time:  Wednesday July 30 2019 15:27:57 EDT Ventricular Rate:  84 PR Interval:    QRS Duration: 102 QT Interval:  437 QTC Calculation: 517 R Axis:   0 Text Interpretation:  Sinus rhythm Abnormal R-wave progression, early transition LVH with secondary repolarization abnormality Prolonged QT interval Confirmed by Davonna Belling [phone]) on 07/30/2019 4:35:24 PM   Radiology No results found.  Procedures Procedures (including critical care time)  Medications Ordered  in ED Medications  cloNIDine (CATAPRES) tablet 0.1 mg (0.1 mg Oral Given 07/30/19 1536)  sodium chloride 0.9 % bolus 1,000 mL (1,000 mLs Intravenous New Bag/Given 07/30/19 1711)     Initial Impression / Assessment and Plan / ED Course  I have reviewed the triage vital signs and the nursing notes.  Pertinent labs & imaging results that were available during my care of the patient were reviewed by me and considered in my medical decision making (see chart for details).  Clinical Course as of Jul 29 1841  Wed Jul 30, 2019  1442 Spoke with [name], patient's sister, via telephone [phone]).  She verifies that the patient lives by herself, but she is able to see the patient about 4 times a week. Patient stopped taking her medications, but does not know when. States the refills she picked up for the patient on August 20th had not been touched.  States patient was doing well and was obviously taking her medications previously.  Once she starts doing better during times of medication compliance, patient then begins to think she no longer needs her medications and will stop taking them.   When she is noncompliant with her medications she typically stops eating, stops caring for herself, begins to have a tremor, and begins to have a lot of trouble communicating.  I described what I was seeing with the patient and her sister tells me this is exactly how she presents when she has been off her medications.   [SJ]  1600 Noted to be 10.0 four months ago. Adjusted for albumin,  level calculated to be 10.8.  Calcium(!): 11.4 [SJ]    Clinical Course User Index [SJ] Haelyn Forgey C, PA-C       Patient presents with suspicion for possible psychiatric decompensation.  This suspicion is supported by physical exam findings. Her sister suspects patient has not been taking her medications.  She is noted to have some hypercalcemia without shortened QT interval on EKG.  In fact, patient does have some prolonged QT  interval. Also noted to have a slight increase in her creatinine. For both of these issues, patient was given IV fluid bolus.   End of shift patient care handoff report given to [facility], PA-C. Plan: Review repeat BMP.  If calcium is still unacceptably high, admit to medicine.  If adequate improvement, medically clear, and await TTS recommendation.  Vitals:   07/30/19 1645 07/30/19 1700 07/30/19 1715 07/30/19 1730  BP:  (!) 163/108  (!) 159/98  Pulse: 93 87 87 86  Resp: (!) 25 (!) 24 (!) 23 17  Temp:      TempSrc:      SpO2: 98% 99% 98% 99%  Weight:      Height:         Final Clinical Impressions(s) / ED Diagnoses   Final diagnoses:  None    ED Discharge Orders    None       Layla Maw 07/30/19 1847    Davonna Belling, MD 07/30/19 2208

## 2019-07-30 NOTE — ED Provider Notes (Signed)
18:00: Assumed care of patient from Paula Kaufmann, PA-C at change of shift pending repeat BMP and TTS assessment.  Please see prior provider note for full H&P. Briefly patient is a 53 year old female with a history of bipolar affective disorder, schizophrenia, and additional medical comorbidities as listed in her chart who presented to the emergency department with suspected psychiatric decompensation while not taking her medications properly.   Her initial labs her calcium was noted to be 11.4, adjusted for albumin level calculated @ 10.8. EKG w/o QT shortening. Given IVFs.   Plan @ change of shift: Review repeat BMP.  If calcium is still unacceptably high, admit to medicine.  If adequate improvement, medically clear, and await TTS recommendation.  19:00: BMP reviewed: creatinine mildly improved, calcium normalized.   Patient medically cleared for TTS assessment. Disposition per [facility].    Results for orders placed or performed during the hospital encounter of 07/30/19  Comprehensive metabolic panel  Result Value Ref Range   Sodium 143 135 - 145 mmol/L   Potassium 3.8 3.5 - 5.1 mmol/L   Chloride 105 98 - 111 mmol/L   CO2 25 22 - 32 mmol/L   Glucose, Bld 149 (H) 70 - 99 mg/dL   BUN 15 6 - 20 mg/dL   Creatinine, Ser 1.33 (H) 0.44 - 1.00 mg/dL   Calcium 11.4 (H) 8.9 - 10.3 mg/dL   Total Protein 8.2 (H) 6.5 - 8.1 g/dL   Albumin 4.8 3.5 - 5.0 g/dL   AST 14 (L) 15 - 41 U/L   ALT 25 0 - 44 U/L   Alkaline Phosphatase 170 (H) 38 - 126 U/L   Total Bilirubin 1.2 0.3 - 1.2 mg/dL   GFR calc non Af Amer 45 (L) >60 mL/min   GFR calc Af Amer 52 (L) >60 mL/min   Anion gap 13 5 - 15  CBC with Differential  Result Value Ref Range   WBC 12.0 (H) 4.0 - 10.5 K/uL   RBC 5.40 (H) 3.87 - 5.11 MIL/uL   Hemoglobin 15.7 (H) 12.0 - 15.0 g/dL   HCT 47.5 (H) 36.0 - 46.0 %   MCV 88.0 80.0 - 100.0 fL   MCH 29.1 26.0 - 34.0 pg   MCHC 33.1 30.0 - 36.0 g/dL   RDW 13.5 11.5 - 15.5 %   Platelets 274 150 - 400 K/uL   nRBC 0.0 0.0 - 0.2 %   Neutrophils Relative % 76 %   Neutro Abs 9.3 (H) 1.7 - 7.7 K/uL   Lymphocytes Relative 16 %   Lymphs Abs 1.9 0.7 - 4.0 K/uL   Monocytes Relative 6 %   Monocytes Absolute 0.7 0.1 - 1.0 K/uL   Eosinophils Relative 0 %   Eosinophils Absolute 0.0 0.0 - 0.5 K/uL   Basophils Relative 1 %   Basophils Absolute 0.1 0.0 - 0.1 K/uL   Immature Granulocytes 1 %   Abs Immature Granulocytes 0.06 0.00 - 0.07 K/uL  Acetaminophen level  Result Value Ref Range   Acetaminophen (Tylenol), Serum <10 (L) 10 - 30 ug/mL  Salicylate level  Result Value Ref Range   Salicylate Lvl [id number] 2.8 - 30.0 mg/dL  Lithium level  Result Value Ref Range   Lithium Lvl 1.03 0.60 - 1.20 mmol/L  Ethanol  Result Value Ref Range   Alcohol, Ethyl (B) <10 <10 mg/dL  hCG, quantitative, pregnancy  Result Value Ref Range   hCG, Beta Chain, Quant, S 2 <5 mIU/mL  Basic metabolic panel  Result Value Ref Range  Sodium 142 135 - 145 mmol/L   Potassium 4.0 3.5 - 5.1 mmol/L   Chloride 110 98 - 111 mmol/L   CO2 24 22 - 32 mmol/L   Glucose, Bld 132 (H) 70 - 99 mg/dL   BUN 14 6 - 20 mg/dL   Creatinine, Ser 1.24 (H) 0.44 - 1.00 mg/dL   Calcium 9.9 8.9 - 10.3 mg/dL   GFR calc non Af Amer 49 (L) >60 mL/min   GFR calc Af Amer 57 (L) >60 mL/min   Anion gap 8 5 - 15   No results found.    Leafy Kindle 07/30/19 1917    Charlesetta Shanks, MD 08/02/19 [phone]

## 2019-07-30 NOTE — ED Notes (Signed)
Psych called. Assessment to be done via telepsych. Machine placed in room.

## 2019-07-31 DIAGNOSIS — R251 Tremor, unspecified: Secondary | ICD-10-CM | POA: Diagnosis not present

## 2019-07-31 DIAGNOSIS — F209 Schizophrenia, unspecified: Secondary | ICD-10-CM | POA: Diagnosis not present

## 2019-07-31 DIAGNOSIS — T50905A Adverse effect of unspecified drugs, medicaments and biological substances, initial encounter: Secondary | ICD-10-CM | POA: Diagnosis present

## 2019-07-31 DIAGNOSIS — F3160 Bipolar disorder, current episode mixed, unspecified: Secondary | ICD-10-CM | POA: Diagnosis not present

## 2019-07-31 LAB — RAPID URINE DRUG SCREEN, HOSP PERFORMED
Amphetamines: NOT DETECTED
Barbiturates: NOT DETECTED
Benzodiazepines: NOT DETECTED
Cocaine: NOT DETECTED
Opiates: NOT DETECTED
Tetrahydrocannabinol: NOT DETECTED

## 2019-07-31 NOTE — ED Provider Notes (Signed)
TTS has evaluated pt: pt denies SI/HI/AVH, no indication for admission at this time, pt can be d/c with outpatient f/u. Will d/c stable.    Francine Graven, DO 07/31/19 1402

## 2019-07-31 NOTE — BH Assessment (Signed)
[facility] Assessment Progress Note  Per Buford Dresser, DO, this pt does not require psychiatric hospitalization at this time.  Pt is to be discharged from [facility] with recommendation to follow up with [facility].  This has been included in pt's discharge instructions.  Pt's nurse has been notified.  Jalene Mullet, [place] Triage Specialist [phone]

## 2019-07-31 NOTE — Consult Note (Addendum)
[facility] Psych ED Discharge  07/31/2019 1:16 PM Paula Dickson  MRN:  [id number] Principal Problem: Medication side effect, initial encounter Discharge Diagnoses: Principal Problem:   Bipolar affective disorder, current episode mixed ([place]) Active Problems:   Schizophrenia ([place])   Subjective: Pt was seen and chart reviewed with treatment team and Dr Mariea Clonts. Pt denies suicidal/homicidal ideation, denies auditory/visual hallucinations and does not appear to be responding to internal stimuli. Pt stated she thinks she got off schedule with some of her medications and missed a couple days but she resumed taking them. She stated she has tremors that are concerning to her. She does have visible tremors to bilateral hands, most likely associated with her extensive use of 1st generation antipsychotics to control her Schizophrenia and Bipolar Disorder. Pt became tearful during the assessment today but she said it was because she wanted to go home. She lives alone but has a sister who is involved with her care. Pt's UDS and BAL are negative. Pt is followed by [facility] for medication management. Pt is psychiatrically clear.   Total Time spent with patient: 30 minutes  Past Psychiatric History: As above  Past Medical History:  Past Medical History:  Diagnosis Date  . Bipolar affective disorder ([place])   . History of arthritis   . History of chicken pox   . History of depression   . History of genital warts   . history of heart murmur   . History of high blood pressure   . History of thyroid disease   . History of UTI   . Hypertension   . Low TSH level 07/13/2017  . Schizophrenia [facility])     Past Surgical History:  Procedure Laterality Date  . ABLATION ON ENDOMETRIOSIS    . CYST REMOVAL NECK     around 11 years ago /benign  . MULTIPLE TOOTH EXTRACTIONS     Family History:  Family History  Problem Relation Age of Onset  . Arthritis Father   . Hyperlipidemia Father   . High blood pressure  Father   . Diabetes Sister   . Diabetes Mother   . Diabetes Brother   . Mental illness Brother   . Alcohol abuse Paternal Uncle   . Alcohol abuse Paternal Grandfather   . Breast cancer Maternal Aunt   . Breast cancer Paternal Aunt   . High blood pressure Sister   . Mental illness Other        runs in family   Family Psychiatric  History: As above Social History:  Social History   Substance and Sexual Activity  Alcohol Use No     Social History   Substance and Sexual Activity  Drug Use No    Social History   Socioeconomic History  . Marital status: Single    Spouse name: Not on file  . Number of children: Not on file  . Years of education: Not on file  . Highest education level: Not on file  Occupational History  . Not on file  Social Needs  . Financial resource strain: Not on file  . Food insecurity    Worry: Not on file    Inability: Not on file  . Transportation needs    Medical: Not on file    Non-medical: Not on file  Tobacco Use  . Smoking status: Never Smoker  . Smokeless tobacco: Never Used  Substance and Sexual Activity  . Alcohol use: No  . Drug use: No  . Sexual activity: Not Currently  Lifestyle  . Physical activity    Days per week: Not on file    Minutes per session: Not on file  . Stress: Not on file  Relationships  . Social Herbalist on phone: Not on file    Gets together: Not on file    Attends religious service: Not on file    Active member of club or organization: Not on file    Attends meetings of clubs or organizations: Not on file    Relationship status: Not on file  Other Topics Concern  . Not on file  Social History Narrative  . Not on file    Has this patient used any form of tobacco in the last 30 days? (Cigarettes, Smokeless Tobacco, Cigars, and/or Pipes) Prescription not provided because: Pt does not use tobacco  Current Medications: No current facility-administered medications for this encounter.     Current Outpatient Medications  Medication Sig Dispense Refill  . benztropine (COGENTIN) 1 MG tablet Take 1 tablet (1 mg total) by mouth 2 (two) times daily. 60 tablet 1  . carvedilol (COREG) 25 MG tablet Take 1 tablet (25 mg total) by mouth 2 (two) times daily with a meal. 60 tablet 2  . cloNIDine (CATAPRES) 0.1 MG tablet Take 1 tablet (0.1 mg total) by mouth 2 (two) times daily. 60 tablet 11  . haloperidol (HALDOL) 10 MG tablet Take 2 tablets (20 mg total) by mouth at bedtime. 60 tablet 2  . lithium carbonate 300 MG capsule 1 in am 2 at hs (Patient taking differently: Take 300-600 mg by mouth 2 (two) times daily with a meal. 1 in am 2 at hs) 90 capsule 1  . temazepam (RESTORIL) 30 MG capsule Take 1 capsule (30 mg total) by mouth at bedtime. (Patient not taking: Reported on 07/30/2019) 30 capsule 1     Musculoskeletal: Strength & Muscle Tone: within normal limits Gait & Station: normal Patient leans: N/A  Psychiatric Specialty Exam: Physical Exam  Nursing note and vitals reviewed. Constitutional: She is oriented to person, place, and time. She appears well-developed and well-nourished.  HENT:  Head: Normocephalic and atraumatic.  Neck: Normal range of motion.  Respiratory: Effort normal.  Musculoskeletal: Normal range of motion.  Neurological: She is alert and oriented to person, place, and time.  Psychiatric: She has a normal mood and affect. Her speech is normal and behavior is normal. Judgment and thought content normal. Cognition and memory are normal.    Review of Systems  Neurological: Positive for tremors.  Psychiatric/Behavioral: Positive for depression. The patient is nervous/anxious.   All other systems reviewed and are negative.   Blood pressure 126/77, pulse 87, temperature 99.5 F (37.5 C), temperature source Oral, resp. rate 16, height 5\' 1"  (1.549 m), weight 63.5 kg, SpO2 99 %.Body mass index is 26.45 kg/m.  General Appearance: Casual  Eye Contact:  Good   Speech:  Clear and Coherent  Volume:  Normal  Mood:  Depressed  Affect:  Congruent and Depressed  Thought Process:  Coherent, Goal Directed and Descriptions of Associations: Intact  Orientation:  Full (Time, Place, and Person)  Thought Content:  Logical  Suicidal Thoughts:  No  Homicidal Thoughts:  No  Memory:  Immediate;   Good Recent;   Good Remote;   Fair  Judgement:  Fair  Insight:  Fair  Psychomotor Activity:  Tremor  Concentration:  Concentration: Fair and Attention Span: Fair  Recall:  [employer] of Knowledge:  Good  Language:  Good  Akathisia:  Negative  Handed:  Right  AIMS (if indicated):   N/A  Assets:  Agricultural consultant Housing Social Support  ADL's:  Intact  Cognition:  WNL  Sleep:   N/A     Demographic Factors:  Low socioeconomic status and Living alone  Loss Factors: Financial problems/change in socioeconomic status  Historical Factors: Family history of mental illness or substance abuse  Risk Reduction Factors:   Sense of responsibility to family  Continued Clinical Symptoms:  Bipolar Disorder:   Mixed State  Cognitive Features That Contribute To Risk:  Closed-mindedness    Suicide Risk:  Minimal: No identifiable suicidal ideation.  Patients presenting with no risk factors but with morbid ruminations; may be classified as minimal risk based on the severity of the depressive symptoms   Plan Of Care/Follow-up recommendations:  Activity:  as tolerated Diet:  heart healthy  Disposition and Treatment Plan:  Take all medications as prescribed. Keep all follow-up appointments as scheduled. Follow up with [facility] for medication management Do not consume alcohol or use illegal drugs while on prescription medications. Report any adverse effects from your medications to your primary care provider promptly.  In the event of recurrent symptoms or worsening symptoms, call 911, a crisis hotline, or go to the nearest  emergency department for evaluation.   Ethelene Hal, NP 07/31/2019, 1:16 PM    Patient seen by telemedicine for psychiatric evaluation, chart reviewed and case discussed with the physician extender and developed treatment plan. Reviewed the information documented and agree with the treatment plan.  Buford Dresser, DO 07/31/19 4:09 PM

## 2019-07-31 NOTE — Discharge Instructions (Addendum)
For your mental health needs, you are advised to follow up with [name].  Call them at your earliest opportunity to schedule an intake appointment:       [place]. [address]      [place], [place] [address]      [phone]      Crisis number: [phone]  Take your usual prescriptions as previously directed.  Call your regular medical doctor today to schedule a follow up appointment within the week.  Return to the Emergency Department immediately sooner if worsening.

## 2019-07-31 NOTE — ED Notes (Signed)
Pt seems confused and has difficulty following commands

## 2019-07-31 NOTE — ED Notes (Signed)
Pt DCd off unit to home per MD. Pt alert, calm. Cooperative. Pt ambulatory off unit . Belongings given to pt. Pt transported by family member

## 2019-07-31 NOTE — ED Notes (Signed)
LM with sister-informed family she has been discharged and needs transport home

## 2019-07-31 NOTE — BH Assessment (Addendum)
Tele Assessment Note   Patient Name: Paula Dickson MRN: [id number] Referring Physician: Kennith Maes, PA-C Location of Patient: [facility] Location of Provider: [place]  Horald Chestnut [place] is an 53 y.o. female presenting with noncompliant with current medication: clonidine, lithium, and temazepam. Pt has had tremors for several days. EMS states pt has had some confusion. Patient denied SI, HI and psychosis.When asked about tremors. Patient denied current outpatient services. Patient reported being alone. Patient is poor historian and unable to answer questions. Patient exhibited confusion throughout assessment. Unable to assess several questions.  Patient lithium levels were in the red. Safety and stabilization needed to monitor lithium levels.   UDS negative BAL negative  Diagnosis: history of bipolar  Past Medical History:  Past Medical History:  Diagnosis Date  . Bipolar affective disorder ([place])   . History of arthritis   . History of chicken pox   . History of depression   . History of genital warts   . history of heart murmur   . History of high blood pressure   . History of thyroid disease   . History of UTI   . Hypertension   . Low TSH level 07/13/2017  . Schizophrenia [facility])     Past Surgical History:  Procedure Laterality Date  . ABLATION ON ENDOMETRIOSIS    . CYST REMOVAL NECK     around 11 years ago /benign  . MULTIPLE TOOTH EXTRACTIONS      Family History:  Family History  Problem Relation Age of Onset  . Arthritis Father   . Hyperlipidemia Father   . High blood pressure Father   . Diabetes Sister   . Diabetes Mother   . Diabetes Brother   . Mental illness Brother   . Alcohol abuse Paternal Uncle   . Alcohol abuse Paternal Grandfather   . Breast cancer Maternal Aunt   . Breast cancer Paternal Aunt   . High blood pressure Sister   . Mental illness Other        runs in family    Social History:  reports that she  has never smoked. She has never used smokeless tobacco. She reports that she does not drink alcohol or use drugs.  Additional Social History:  Alcohol / Drug Use Pain Medications: see MAR Prescriptions: see MAR Over the Counter: see MAR  CIWA: CIWA-Ar BP: (!) 143/73 Pulse Rate: 79 COWS:    Allergies: No Known Allergies  Home Medications: (Not in a hospital admission)   OB/GYN Status:  No LMP recorded. (Menstrual status: Perimenopausal).  General Assessment Data [facility] TTS Assessment: In system Is this a Tele or Face-to-Face Assessment?: Tele Assessment Is this an Initial Assessment or a Re-assessment for this encounter?: Initial Assessment Patient Accompanied by:: N/A Language Other than English: No Living Arrangements: (home alone) What gender do you identify as?: Female Marital status: Single Pregnancy Status: Unknown Living Arrangements: Alone Can pt return to current living arrangement?: Yes Admission Status: Involuntary Is patient capable of signing voluntary admission?: (IVC) Referral Source: Self/Family/Friend     Crisis Care Plan Living Arrangements: Alone Legal Guardian: (self) Name of Psychiatrist: (none) Name of Therapist: (none)  Education Status Is patient currently in school?: No Is the patient employed, unemployed or receiving disability?: Receiving disability income  Risk to self with the past 6 months Suicidal Ideation: No Has patient been a risk to self within the past 6 months prior to admission? : Other (comment) Suicidal Intent: No Has  patient had any suicidal intent within the past 6 months prior to admission? : No Is patient at risk for suicide?: No Suicidal Plan?: No-Not Currently/Within Last 6 Months Has patient had any suicidal plan within the past 6 months prior to admission? : No Access to Means: No What has been your use of drugs/alcohol within the last 12 months?: (none reported) Previous Attempts/Gestures:  No How many times?: (0) Other Self Harm Risks: (0) Triggers for Past Attempts: (n/a) Intentional Self Injurious Behavior: None Family Suicide History: Unknown Recent stressful life event(s): (needs refill) Persecutory voices/beliefs?: Yes Depression: No Depression Symptoms: (none reported) Substance abuse history and/or treatment for substance abuse?: No Suicide prevention information given to non-admitted patients: Not applicable  Risk to Others within the past 6 months Homicidal Ideation: No Does patient have any lifetime risk of violence toward others beyond the six months prior to admission? : Unknown Thoughts of Harm to Others: No Current Homicidal Intent: No Current Homicidal Plan: No Access to Homicidal Means: No Identified Victim: (n/la) History of harm to others?: No Assessment of Violence: None Noted Violent Behavior Description: (aggressive wtih threre own belongs.) Does patient have access to weapons?: No Criminal Charges Pending?: No Does patient have a court date: No Is patient on probation?: No  Psychosis Hallucinations: None noted Delusions: None noted  Mental Status Report Appearance/Hygiene: Bizarre Eye Contact: Fair Motor Activity: Tremors Speech: Logical/coherent Level of Consciousness: Alert Mood: Anxious, Sad Affect: Anxious, Sad Anxiety Level: Moderate Thought Processes: Relevant Judgement: Partial Orientation: Person, Place, Time, Situation Obsessive Compulsive Thoughts/Behaviors: None  Cognitive Functioning Concentration: Normal Memory: Recent Intact Is patient IDD: No Insight: Poor Impulse Control: Poor Appetite: Good Have you had any weight changes? : No Change Sleep: No Change Total Hours of Sleep: ("normal") Vegetative Symptoms: None  ADLScreening [facility] Assessment Services) Patient's cognitive ability adequate to safely complete daily activities?: Yes Patient able to express need for assistance with ADLs?: Yes Independently  performs ADLs?: Yes (appropriate for developmental age)  Prior Inpatient Therapy Prior Inpatient Therapy: No  Prior Outpatient Therapy Prior Outpatient Therapy: Yes Prior Therapy Dates: (2019) Prior Therapy Facilty/Provider(s): ([facility]) Reason for Treatment: (mental illness) Does patient have an ACCT team?: No Does patient have Intensive In-House Services?  : No Does patient have [name] services? : No Does patient have P4CC services?: No  ADL Screening (condition at time of admission) Patient's cognitive ability adequate to safely complete daily activities?: Yes Patient able to express need for assistance with ADLs?: Yes Independently performs ADLs?: Yes (appropriate for developmental age)  Regulatory affairs officer (For Healthcare) Does Patient Have a Medical Advance Directive?: No Would patient like information on creating a medical advance directive?: No - Patient declined   Disposition:  Disposition Initial Assessment Completed for this Encounter: Yes  Paula Chalk, NP, patient meets inpatient criteria due to lithium levels. TTS to secure placement.   This service was provided via telemedicine using a 2-way, interactive audio and video technology.  Names of all persons participating in this telemedicine service and their role in this encounter. Name: Paula Dickson Role: Patient  Name: Paula Dickson Role: TTS Clinician   Name:  Role:   Name:  Role:     Venora Maples 07/31/2019 2:12 AM

## 2019-07-31 NOTE — ED Notes (Signed)
Patient provided with scrubs and socks.

## 2019-07-31 NOTE — ED Notes (Signed)
Attempted to call pt's sister to pick her up for D/C.  Called 2x, no answer

## 2019-07-31 NOTE — ED Notes (Signed)
Called sister to inform pt DC , left message.

## 2019-07-31 NOTE — Progress Notes (Signed)
CSW called pt's sister [name]'s number at [phone] (found in previous notes from 03/30/17) to inform her pt is ready for D/C and the VM was full.  CSW messaged pt to please call back with a HIPPA-compliant message.  CSW also called pt's sister [name]'s number at ph: [phone] and left a HIPPA-compliant VM.  CSW will continue to follow for D/C needs.  Alphonse Guild. Sabiha Sura, LCSW, LCAS, CSI Transitions of Care Clinical Social Worker Care Coordination Department Ph: [phone]

## 2019-08-26 ENCOUNTER — Other Ambulatory Visit: Payer: Self-pay

## 2019-08-26 ENCOUNTER — Encounter (HOSPITAL_COMMUNITY): Payer: Self-pay

## 2019-08-26 ENCOUNTER — Emergency Department (HOSPITAL_COMMUNITY)
Admission: EM | Admit: 2019-08-26 | Discharge: 2019-08-26 | Disposition: A | Discharge status: Other Acute Inpatient Hospital | Payer: Medicare Other | Attending: Emergency Medicine | Admitting: Emergency Medicine

## 2019-08-26 DIAGNOSIS — F200 Paranoid schizophrenia: Secondary | ICD-10-CM | POA: Insufficient documentation

## 2019-08-26 DIAGNOSIS — Z79899 Other long term (current) drug therapy: Secondary | ICD-10-CM | POA: Diagnosis not present

## 2019-08-26 DIAGNOSIS — I10 Essential (primary) hypertension: Secondary | ICD-10-CM | POA: Insufficient documentation

## 2019-08-26 DIAGNOSIS — F312 Bipolar disorder, current episode manic severe with psychotic features: Secondary | ICD-10-CM | POA: Insufficient documentation

## 2019-08-26 DIAGNOSIS — Z20828 Contact with and (suspected) exposure to other viral communicable diseases: Secondary | ICD-10-CM | POA: Insufficient documentation

## 2019-08-26 DIAGNOSIS — Z046 Encounter for general psychiatric examination, requested by authority: Secondary | ICD-10-CM | POA: Diagnosis present

## 2019-08-26 LAB — SARS CORONAVIRUS 2 BY RT PCR (HOSPITAL ORDER, PERFORMED IN ~~LOC~~ HOSPITAL LAB): SARS Coronavirus 2: NEGATIVE

## 2019-08-26 LAB — COMPREHENSIVE METABOLIC PANEL
ALT: 16 U/L (ref 0–44)
AST: 15 U/L (ref 15–41)
Albumin: 4.3 g/dL (ref 3.5–5.0)
Alkaline Phosphatase: 82 U/L (ref 38–126)
Anion gap: 12 (ref 5–15)
BUN: 15 mg/dL (ref 6–20)
CO2: 23 mmol/L (ref 22–32)
Calcium: 10 mg/dL (ref 8.9–10.3)
Chloride: 106 mmol/L (ref 98–111)
Creatinine, Ser: 0.95 mg/dL (ref 0.44–1.00)
GFR calc Af Amer: >60 mL/min (ref >60)
GFR calc non Af Amer: >60 mL/min (ref >60)
Glucose, Bld: 138 mg/dL — ABNORMAL HIGH (ref 70–99)
Potassium: 3.4 mmol/L — ABNORMAL LOW (ref 3.5–5.1)
Sodium: 141 mmol/L (ref 135–145)
Total Bilirubin: 1.1 mg/dL (ref 0.3–1.2)
Total Protein: 7.2 g/dL (ref 6.5–8.1)

## 2019-08-26 LAB — CBC WITH DIFFERENTIAL/PLATELET
Abs Immature Granulocytes: 0.02 10*3/uL (ref 0.00–0.07)
Basophils Absolute: 0 10*3/uL (ref 0.0–0.1)
Basophils Relative: 1 %
Eosinophils Absolute: 0.1 10*3/uL (ref 0.0–0.5)
Eosinophils Relative: 1 %
HCT: 40.7 % (ref 36.0–46.0)
Hemoglobin: 12.9 g/dL (ref 12.0–15.0)
Immature Granulocytes: 0 %
Lymphocytes Relative: 33 %
Lymphs Abs: 2.1 10*3/uL (ref 0.7–4.0)
MCH: 28.6 pg (ref 26.0–34.0)
MCHC: 31.7 g/dL (ref 30.0–36.0)
MCV: 90.2 fL (ref 80.0–100.0)
Monocytes Absolute: 0.7 10*3/uL (ref 0.1–1.0)
Monocytes Relative: 11 %
Neutro Abs: 3.4 10*3/uL (ref 1.7–7.7)
Neutrophils Relative %: 54 %
Platelets: 255 10*3/uL (ref 150–400)
RBC: 4.51 MIL/uL (ref 3.87–5.11)
RDW: 12.7 % (ref 11.5–15.5)
WBC: 6.3 10*3/uL (ref 4.0–10.5)
nRBC: 0 % (ref 0.0–0.2)

## 2019-08-26 LAB — ETHANOL: Alcohol, Ethyl (B): <10 mg/dL (ref <10)

## 2019-08-26 LAB — SALICYLATE LEVEL: Salicylate Lvl: <7 mg/dL (ref 2.8–30.0)

## 2019-08-26 LAB — ACETAMINOPHEN LEVEL: Acetaminophen (Tylenol), Serum: <10 ug/mL — ABNORMAL LOW (ref 10–30)

## 2019-08-26 MED ORDER — DIPHENHYDRAMINE HCL 50 MG/ML IJ SOLN
50.0000 mg | Freq: Once | INTRAMUSCULAR | Status: AC
  Administered 2019-08-26: 50 mg via INTRAMUSCULAR
  Filled 2019-08-26: qty 1

## 2019-08-26 MED ORDER — STERILE WATER FOR INJECTION IJ SOLN
INTRAMUSCULAR | Status: AC
  Administered 2019-08-26: 16:00:00 10 mL
  Filled 2019-08-26: qty 10

## 2019-08-26 MED ORDER — OLANZAPINE 5 MG PO TBDP
5.0000 mg | ORAL_TABLET | Freq: Once | ORAL | Status: AC
  Administered 2019-08-26: 5 mg via ORAL
  Filled 2019-08-26: qty 1

## 2019-08-26 MED ORDER — OLANZAPINE 10 MG IM SOLR
5.0000 mg | Freq: Once | INTRAMUSCULAR | Status: AC
  Administered 2019-08-26: 5 mg via INTRAMUSCULAR
  Filled 2019-08-26: qty 10

## 2019-08-26 NOTE — ED Notes (Signed)
Pt ambulatory w/o difficulty from triage to room 28
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
## 2019-08-26 NOTE — ED Notes (Signed)
Eating, calm

## 2019-08-26 NOTE — Care Management (Signed)
Accepted to [place], to [facility], per [facility]  Dr. Dareen Piano is the accepting  [phone] is the number to give report The patient is able to come at any time   Writer informed the nurse working with the patient.
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
## 2019-08-26 NOTE — ED Notes (Addendum)
Nad, resting quietly.  Pt argumentative, is aware that she needs to collect a urine. intermittantly singing in room.  After arrival to unit, pharm tech attempted to verify medications but ht ept refused to talk to her

## 2019-08-26 NOTE — ED Notes (Signed)
Per [facility] pt called them for assistance and they were not able contact any family members.

## 2019-08-26 NOTE — ED Notes (Signed)
TTS in progress 

## 2019-08-26 NOTE — Care Management (Signed)
Under review - [facility], [facility], [facility], [facility], [facility], [facility], [facility], [facility], [facility], [facility], [facility], [facility], [facility], [facility], [facility], [facility], [facility], [facility], [facility], [facility], [facility], [facility], [facility], [facility], [facility], [facility], [place], [facility], [facility], [place], [facility], [facility], [place]   

## 2019-08-26 NOTE — BHH Counselor (Signed)
TTS attempting to reach [facility] ED for tele-psych.

## 2019-08-26 NOTE — BH Assessment (Signed)
[facility] Assessment Progress Note  Per Shuvon Rankin, FNP, this pt requires psychiatric hospitalization at this time.  Pt presents under IVC initiated by law enforcement and upheld by [place], DO.  The following facilities have been contacted to seek placement for this pt, with results as noted:  Beds available, information sent, decision pending:  [place]:  [facility]   Jalene Mullet, [place] Coordinator [phone]

## 2019-08-26 NOTE — ED Notes (Addendum)
Pt becoming louder and more difficult to redirect at times, oulling her shirt up and exposing herself. Pt in the hall and declined to return to room and had to be assisted by security back to room

## 2019-08-26 NOTE — ED Notes (Addendum)
Pt yelling in room, instructed not to yell for assistance, and reported that she did not know how to use the call bell.  This writer attempted to show her how,  Call bell touched her leg and she swatted it away >>>"don't touch me..." Pt is aware that she needs to collect a urine sample.  Pt also religious preoccupied

## 2019-08-26 NOTE — ED Notes (Signed)
Pt medicated w/ minimal difficulty, security present

## 2019-08-26 NOTE — ED Notes (Signed)
Sheriff contacted for transport
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
## 2019-08-26 NOTE — ED Notes (Signed)
Sheriff is here to transport
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
## 2019-08-26 NOTE — ED Notes (Signed)
Pt would not sit still for VS, kept wiggling, and moving arms.  EDP updated concning VS, no change in dc instrictions

## 2019-08-26 NOTE — ED Notes (Signed)
Pt laying In bed,  Pt allowed vs recheck w/ difficulty. Pt still argumenative, and difficult to redirect, but does answer questions mot readily.  Pt denies si/hi/and avh, but does report that she has AVH, but will not elaborate.

## 2019-08-26 NOTE — ED Notes (Signed)
Laying on bed, calm, talking to self

## 2019-09-09 ENCOUNTER — Ambulatory Visit (INDEPENDENT_AMBULATORY_CARE_PROVIDER_SITE_OTHER): Payer: Medicare Other

## 2019-09-09 ENCOUNTER — Encounter (HOSPITAL_COMMUNITY): Payer: Self-pay | Admitting: Emergency Medicine

## 2019-09-09 ENCOUNTER — Ambulatory Visit (HOSPITAL_COMMUNITY)
Admission: EM | Admit: 2019-09-09 | Discharge: 2019-09-09 | Disposition: A | Discharge status: Home / Self Care | Payer: Medicare Other | Attending: Family Medicine | Admitting: Family Medicine

## 2019-09-09 ENCOUNTER — Other Ambulatory Visit: Payer: Self-pay

## 2019-09-09 ENCOUNTER — Ambulatory Visit (HOSPITAL_COMMUNITY): Payer: Medicare Other

## 2019-09-09 DIAGNOSIS — M25572 Pain in left ankle and joints of left foot: Secondary | ICD-10-CM | POA: Diagnosis not present

## 2019-09-09 MED ORDER — ACETAMINOPHEN 500 MG PO TABS: 500.0000 mg | ORAL_TABLET | Freq: Four times a day (QID) | ORAL | 0 refills | Status: DC | PRN

## 2019-09-09 NOTE — Discharge Instructions (Addendum)
Your x ray ws normal.  Tylenol for pain.  Follow up as needed for continued or worsening symptoms
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
## 2019-10-06 ENCOUNTER — Inpatient Hospital Stay: Admission: RE | Admit: 2019-10-06 | Payer: Medicare Other | Source: Ambulatory Visit

## 2019-10-11 ENCOUNTER — Encounter (HOSPITAL_COMMUNITY): Payer: Self-pay

## 2019-10-11 ENCOUNTER — Observation Stay (HOSPITAL_COMMUNITY)
Admission: EM | Admit: 2019-10-11 | Discharge: 2019-10-16 | Disposition: A | Discharge status: Other Acute Inpatient Hospital | Payer: Medicare Other | Attending: Internal Medicine | Admitting: Internal Medicine

## 2019-10-11 ENCOUNTER — Other Ambulatory Visit: Payer: Self-pay

## 2019-10-11 DIAGNOSIS — E860 Dehydration: Secondary | ICD-10-CM | POA: Diagnosis not present

## 2019-10-11 DIAGNOSIS — E870 Hyperosmolality and hypernatremia: Secondary | ICD-10-CM

## 2019-10-11 DIAGNOSIS — F23 Brief psychotic disorder: Secondary | ICD-10-CM | POA: Diagnosis present

## 2019-10-11 DIAGNOSIS — Z79899 Other long term (current) drug therapy: Secondary | ICD-10-CM | POA: Diagnosis not present

## 2019-10-11 DIAGNOSIS — R270 Ataxia, unspecified: Secondary | ICD-10-CM

## 2019-10-11 DIAGNOSIS — I10 Essential (primary) hypertension: Secondary | ICD-10-CM | POA: Diagnosis not present

## 2019-10-11 DIAGNOSIS — Z20828 Contact with and (suspected) exposure to other viral communicable diseases: Secondary | ICD-10-CM | POA: Diagnosis not present

## 2019-10-11 DIAGNOSIS — E0590 Thyrotoxicosis, unspecified without thyrotoxic crisis or storm: Secondary | ICD-10-CM | POA: Diagnosis not present

## 2019-10-11 DIAGNOSIS — F312 Bipolar disorder, current episode manic severe with psychotic features: Secondary | ICD-10-CM | POA: Diagnosis not present

## 2019-10-11 DIAGNOSIS — R7989 Other specified abnormal findings of blood chemistry: Secondary | ICD-10-CM

## 2019-10-11 DIAGNOSIS — F209 Schizophrenia, unspecified: Secondary | ICD-10-CM | POA: Diagnosis present

## 2019-10-11 LAB — URINALYSIS, ROUTINE W REFLEX MICROSCOPIC
Bilirubin Urine: NEGATIVE
Glucose, UA: NEGATIVE mg/dL
Hgb urine dipstick: NEGATIVE
Ketones, ur: 20 mg/dL — AB
Leukocytes,Ua: NEGATIVE
Nitrite: NEGATIVE
Protein, ur: NEGATIVE mg/dL
Specific Gravity, Urine: 1.008 (ref 1.005–1.030)
pH: 7 (ref 5.0–8.0)

## 2019-10-11 LAB — COMPREHENSIVE METABOLIC PANEL
ALT: 16 U/L (ref 0–44)
AST: 15 U/L (ref 15–41)
Albumin: 4.4 g/dL (ref 3.5–5.0)
Alkaline Phosphatase: 88 U/L (ref 38–126)
Anion gap: 12 (ref 5–15)
BUN: 20 mg/dL (ref 6–20)
CO2: 22 mmol/L (ref 22–32)
Calcium: 11 mg/dL — ABNORMAL HIGH (ref 8.9–10.3)
Chloride: 115 mmol/L — ABNORMAL HIGH (ref 98–111)
Creatinine, Ser: 1.15 mg/dL — ABNORMAL HIGH (ref 0.44–1.00)
GFR calc Af Amer: >60 mL/min (ref >60)
GFR calc non Af Amer: 54 mL/min — ABNORMAL LOW (ref >60)
Glucose, Bld: 134 mg/dL — ABNORMAL HIGH (ref 70–99)
Potassium: 3.8 mmol/L (ref 3.5–5.1)
Sodium: 149 mmol/L — ABNORMAL HIGH (ref 135–145)
Total Bilirubin: 1.4 mg/dL — ABNORMAL HIGH (ref 0.3–1.2)
Total Protein: 7.7 g/dL (ref 6.5–8.1)

## 2019-10-11 LAB — RAPID URINE DRUG SCREEN, HOSP PERFORMED
Amphetamines: NOT DETECTED
Barbiturates: NOT DETECTED
Benzodiazepines: POSITIVE — AB
Cocaine: NOT DETECTED
Opiates: NOT DETECTED
Tetrahydrocannabinol: NOT DETECTED

## 2019-10-11 LAB — CBC WITH DIFFERENTIAL/PLATELET
Abs Immature Granulocytes: 0.04 10*3/uL (ref 0.00–0.07)
Basophils Absolute: 0 10*3/uL (ref 0.0–0.1)
Basophils Relative: 0 %
Eosinophils Absolute: 0 10*3/uL (ref 0.0–0.5)
Eosinophils Relative: 0 %
HCT: 43.1 % (ref 36.0–46.0)
Hemoglobin: 13.5 g/dL (ref 12.0–15.0)
Immature Granulocytes: 0 %
Lymphocytes Relative: 10 %
Lymphs Abs: 1.1 10*3/uL (ref 0.7–4.0)
MCH: 28.4 pg (ref 26.0–34.0)
MCHC: 31.3 g/dL (ref 30.0–36.0)
MCV: 90.5 fL (ref 80.0–100.0)
Monocytes Absolute: 0.7 10*3/uL (ref 0.1–1.0)
Monocytes Relative: 6 %
Neutro Abs: 9.7 10*3/uL — ABNORMAL HIGH (ref 1.7–7.7)
Neutrophils Relative %: 84 %
Platelets: 273 10*3/uL (ref 150–400)
RBC: 4.76 MIL/uL (ref 3.87–5.11)
RDW: 12.6 % (ref 11.5–15.5)
WBC: 11.6 10*3/uL — ABNORMAL HIGH (ref 4.0–10.5)
nRBC: 0 % (ref 0.0–0.2)

## 2019-10-11 LAB — ETHANOL: Alcohol, Ethyl (B): <10 mg/dL (ref <10)

## 2019-10-11 LAB — LITHIUM LEVEL: Lithium Lvl: 0.62 mmol/L (ref 0.60–1.20)

## 2019-10-11 MED ORDER — LITHIUM CARBONATE 300 MG PO CAPS
300.0000 mg | ORAL_CAPSULE | Freq: Two times a day (BID) | ORAL | Status: DC
  Administered 2019-10-11 – 2019-10-13 (×4): 300 mg via ORAL
  Filled 2019-10-11 (×4): qty 1
  Filled 2019-10-11: qty 2

## 2019-10-11 MED ORDER — BENZTROPINE MESYLATE 0.5 MG PO TABS
1.0000 mg | ORAL_TABLET | Freq: Two times a day (BID) | ORAL | Status: DC
  Administered 2019-10-11 – 2019-10-16 (×10): 1 mg via ORAL
  Filled 2019-10-11: qty 1
  Filled 2019-10-11: qty 2
  Filled 2019-10-11 (×2): qty 1
  Filled 2019-10-11 (×2): qty 2
  Filled 2019-10-11 (×2): qty 1
  Filled 2019-10-11 (×3): qty 2

## 2019-10-11 MED ORDER — TEMAZEPAM 15 MG PO CAPS
30.0000 mg | ORAL_CAPSULE | Freq: Every day | ORAL | Status: DC
  Administered 2019-10-11 – 2019-10-15 (×4): 30 mg via ORAL
  Filled 2019-10-11 (×4): qty 2

## 2019-10-11 MED ORDER — CLONIDINE HCL 0.1 MG PO TABS
0.1000 mg | ORAL_TABLET | Freq: Two times a day (BID) | ORAL | Status: DC
  Administered 2019-10-11 – 2019-10-16 (×10): 0.1 mg via ORAL
  Filled 2019-10-11 (×10): qty 1

## 2019-10-11 MED ORDER — CARVEDILOL 25 MG PO TABS
25.0000 mg | ORAL_TABLET | Freq: Two times a day (BID) | ORAL | Status: DC
  Administered 2019-10-11 – 2019-10-16 (×8): 25 mg via ORAL
  Filled 2019-10-11 (×13): qty 1

## 2019-10-11 MED ORDER — HALOPERIDOL 5 MG PO TABS
20.0000 mg | ORAL_TABLET | Freq: Every day | ORAL | Status: DC
  Administered 2019-10-11 – 2019-10-15 (×5): 20 mg via ORAL
  Filled 2019-10-11 (×6): qty 4

## 2019-10-11 NOTE — ED Triage Notes (Signed)
Per EMS-Patient lives alone. Patient was recently hospitalized in [facility] for psychiatric reasons per family. Patient's family is unsure if the patient is compliant with medications.   Family reports that the patient normally has difficulty with speech and disorganized thoughts, difficulty walking, and tremors of all extremities, but much worse today. Family told EMS that they called her yesterday and she picked up the phone but did not speak to them. Today the family visited the patient and found her lying on the floor naked. Patient stated that she did not fall, but laid down. Patient has a history of schizophrenia and Bipolar. Patient denies any pain.

## 2019-10-11 NOTE — ED Notes (Signed)
Patient placed on pure wick and encouraged to void.  

## 2019-10-11 NOTE — BH Assessment (Addendum)
Tele Assessment Note   Patient Name: Paula Dickson MRN: [id number] Referring Physician:Stephen, Wilson Singer, MD Location of [facility]: [facility] Location of Provider: [place] Department  Horald Chestnut Santaella is an 53 y.o. female. Pt presented to [facility] via EMS. Pt during the assessment presented very silly and lying down in bed. Pt was not able to articulate many of my questions or provide any information, pt mainly responded with 1 word responses. Pt was asked if she was SI, HI, AVH, pt replied, "NO, my mother told me not to do something like that". Pt denied SI, HI, AVH. Pt was unable to answered any others questions and kept repeating herself about her mother telling her to not to hurt herself. Pt presented dressed in scrubs, alert, oriented x3 with some incoherent but relevant speech and ( tremor)motor behavior. Eye contact is poor.  Concentration was poor. Pt's mood is silly and affect is congruent with mood. Thought process is incoherent . There is no indication pt is currently responding to internal stimuli or experiencing delusional thought content. Pt judgment was impaired and she was not able to process questions, her thoughts and she was repeating phrases over and over again.       PER Nurse note  10/11/19: -Patient lives alone. Patient was recently hospitalized in [facility] for psychiatric reasons per family. Patient's family is unsure if the patient is compliant with medications.   Family reports that the patient normally has difficulty with speech and disorganized thoughts, difficulty walking, and tremors of all extremities, but much worse today. Family told EMS that they called her yesterday and she picked up the phone but did not speak to them. Today the family visited the patient and found her lying on the floor naked. Patient stated that she did not fall, but laid down. Patient has a history of schizophrenia and Bipolar.  Per EDP Note 10/11/19:  53 year old female  brought in for psychiatric evaluation.  She is currently unable to provide a coherent history.  She is answering questions but does not seem to have much insight.  Laughing inappropriately at times.  She has a underlying psychiatric history.  Per family her symptoms worsened over the past day.  Apparnetly she normally has some speech difficulty and disorganized thought process. This has worsened and she also seems very weak. Family found patient laying on the floor naked today and EMS called. Pt can't tell me why she was on the ground. She says she feels "wonderful." Denies any pain. When asked about medication compliance she says she cannot remember.     Collateral: TTS spoke with pts sister [name]. Pts sister reported that pt "gets really depressed around the holidays, and her condition amps up to another level". Pt sister expressed that she had not seen any recent SI, HI behavior from her but bizarre behavior such as unplugging all phones in her home so no one could talk to her. Pt sister also stated pt's current provider is [name] and that she is not sure if pt has been med compliant or not.  Pt sister also stated that she got out of [facility] recently for  Psych inpatient treatment.   Diagnosis: Bipolar affective disorder   Past Medical History:  Past Medical History:  Diagnosis Date  . Bipolar affective disorder ([place])   . History of arthritis   . History of chicken pox   . History of depression   . History of genital warts   . history of heart murmur   .  History of high blood pressure   . History of thyroid disease   . History of UTI   . Hypertension   . Low TSH level 07/13/2017  . Schizophrenia [facility])     Past Surgical History:  Procedure Laterality Date  . ABLATION ON ENDOMETRIOSIS    . CYST REMOVAL NECK     around 11 years ago /benign  . MULTIPLE TOOTH EXTRACTIONS      Family History:  Family History  Problem Relation Age of Onset  . Arthritis Father   . Hyperlipidemia  Father   . High blood pressure Father   . Diabetes Sister   . Diabetes Mother   . Diabetes Brother   . Mental illness Brother   . Alcohol abuse Paternal Uncle   . Alcohol abuse Paternal Grandfather   . Breast cancer Maternal Aunt   . Breast cancer Paternal Aunt   . High blood pressure Sister   . Mental illness Other        runs in family    Social History:  reports that she has never smoked. She has never used smokeless tobacco. She reports that she does not drink alcohol or use drugs.  Additional Social History:  Alcohol / Drug Use Pain Medications: see MAR Prescriptions: see MAR Over the Counter: see MAR  CIWA: CIWA-Ar BP: (!) 157/79 Pulse Rate: 67 COWS:    Allergies: No Known Allergies  Home Medications: (Not in a hospital admission)   OB/GYN Status:  No LMP recorded. (Menstrual status: Perimenopausal).  General Assessment Data Assessment unable to be completed: Yes Reason for not completing assessment: Unable to reach staff to move cart [facility] TTS Assessment: In system Is this a Tele or Face-to-Face Assessment?: Tele Assessment Is this an Initial Assessment or a Re-assessment for this encounter?: Initial Assessment Patient Accompanied by:: N/A Language Other than English: No Living Arrangements: Other (Comment) What gender do you identify as?: Female Marital status: Single Pregnancy Status: No Living Arrangements: Alone Can pt return to current living arrangement?: Yes Admission Status: Voluntary [name]: Other Is patient capable of signing voluntary admission?: Yes Referral Source: Self/Family/Friend     Crisis Care Plan Living Arrangements: Alone Legal Guardian: Other: Name of Psychiatrist: [place] Name of Therapist: [name]  Education Status Is patient currently in school?: No Is the patient employed, unemployed or receiving disability?: Receiving disability income  Risk to self with the past 6 months Suicidal  Ideation: No Has patient been a risk to self within the past 6 months prior to admission? : No Suicidal Intent: No Has patient had any suicidal intent within the past 6 months prior to admission? : No Is patient at risk for suicide?: No Suicidal Plan?: No-Not Currently/Within Last 6 Months Has patient had any suicidal plan within the past 6 months prior to admission? : No Access to Means: No What has been your use of drugs/alcohol within the last 12 months?: none Previous Attempts/Gestures: Yes How many times?: 1 Other Self Harm Risks: none known Triggers for Past Attempts: None known Intentional Self Injurious Behavior: None Family Suicide History: No Recent stressful life event(s): Other (Comment) Persecutory voices/beliefs?: (unknown) Depression: (UTA) Depression Symptoms: (UTA) Substance abuse history and/or treatment for substance abuse?: (UTA) Suicide prevention information given to non-admitted patients: Not applicable  Risk to Others within the past 6 months Homicidal Ideation: No Does patient have any lifetime risk of violence toward others beyond the six months prior to admission? : No Thoughts of Harm to Others:  No Current Homicidal Intent: No Current Homicidal Plan: No Access to Homicidal Means: No Identified Victim: none History of harm to others?: No Assessment of Violence: None Noted Violent Behavior Description: (none) Does patient have access to weapons?: No Criminal Charges Pending?: No Does patient have a court date: No Is patient on probation?: No  Psychosis Hallucinations: None noted Delusions: (UTA)  Mental Status Report Appearance/Hygiene: In scrubs Eye Contact: Poor Motor Activity: Freedom of movement, Tremors Speech: Incoherent Level of Consciousness: Alert Mood: Silly Affect: Silly Anxiety Level: Moderate Thought Processes: Relevant Judgement: Impaired Orientation: Person, Time, Place Obsessive Compulsive Thoughts/Behaviors:  None  Cognitive Functioning Concentration: Poor Memory: Unable to Assess Is patient IDD: No Insight: Poor Impulse Control: Poor Appetite: (UTA) Have you had any weight changes? : (UTA) Sleep: Unable to Assess Total Hours of Sleep: (UTA) Vegetative Symptoms: Unable to Assess  ADLScreening [facility]) Patient's cognitive ability adequate to safely complete daily activities?: Yes Patient able to express need for assistance with ADLs?: Yes Independently performs ADLs?: Yes (appropriate for developmental age)  Prior Inpatient Therapy Prior Inpatient Therapy: (UTA)  Prior Outpatient Therapy Prior Outpatient Therapy: Yes Prior Therapy Dates: 2019 Prior Therapy [facility]ilty/Provider(s): [facility] Reason for Treatment: mental illness Does patient have an ACCT team?: Unknown Does patient have Intensive In-House Services?  : Unknown Does patient have [name] services? : Yes Does patient have P4CC services?: No  ADL Screening (condition at time of admission) Patient's cognitive ability adequate to safely complete daily activities?: Yes Patient able to express need for assistance with ADLs?: Yes Independently performs ADLs?: Yes (appropriate for developmental age)             Regulatory affairs officer (For Healthcare) Does Patient Have a Medical Advance Directive?: No Would patient like information on creating a medical advance directive?: No - Patient declined          Disposition:  Per Lindon Romp, FNP pt is recommended for overnight observation and to be reassessed by psychiatry in the am. TTS confirmed status with attending provider. Disposition Initial Assessment Completed for this Encounter: Yes  This service was provided via telemedicine using a 2-way, interactive audio and video technology.  Names of all persons participating in this telemedicine service and their role in this encounter. Name: Paula Dickson Role: Patient  Name: Antony Contras Role: TTS Counselor   Name:  Role:   Name Role:     Donato Heinz 10/11/2019 10:37 PM

## 2019-10-11 NOTE — BH Assessment (Signed)
Received TTS consult request. Attempted several times to arrange for tele-cart to be moved to Pt's room without success.   Evelena Peat, [facility], [facility] Triage Specialist [phone]

## 2019-10-11 NOTE — ED Provider Notes (Signed)
[place] DEPT Provider Note   CSN: [id number] Arrival date & time: 10/11/19  1443     History   Chief Complaint Chief Complaint  Patient presents with  . Psychiatric Evaluation    HPI Paula Dickson is a 53 y.o. female.     HPI   53 year old female brought in for psychiatric evaluation.  She is currently unable to provide a coherent history.  She is answering questions but does not seem to have much insight.  Laughing inappropriately at times.  She has a underlying psychiatric history.  Per family her symptoms worsened over the past day.  Apparnetly she normally has some speech difficulty and disorganized thought process. This has worsened and she also seems very weak. Family found patient laying on the floor naked today and EMS called. Pt can't tell me why she was on the ground. She says she feels "wonderful." Denies any pain. When asked about medication compliance she says she cannot remember.   Past Medical History:  Diagnosis Date  . Bipolar affective disorder ([place])   . History of arthritis   . History of chicken pox   . History of depression   . History of genital warts   . history of heart murmur   . History of high blood pressure   . History of thyroid disease   . History of UTI   . Hypertension   . Low TSH level 07/13/2017  . Schizophrenia [facility])     Patient Active Problem List   Diagnosis Date Noted  . Medication side effect, initial encounter   . Schizoaffective disorder ([place]) 03/13/2019  . Severe mixed bipolar 1 disorder without psychosis ([place]) 03/13/2019  . Bipolar affective disorder, current episode mixed ([place]) 11/01/2018  . Toxic encephalopathy 07/14/2017  . Suicide attempt ([place]) 07/13/2017  . Low TSH level 07/13/2017  . Overdose of psychotropic 07/12/2017  . Valproic acid toxicity 07/12/2017  . Hyperammonemia ([place]) 07/12/2017  . Schizophrenia ([place]) 07/12/2017  . Bipolar disorder, most recent episode depressed  ([place]) 07/12/2017  . Insomnia 12/27/2015  . Abnormal urinalysis 12/27/2015  . 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 Bipolar I disorder, most recent episode manic, severe with psychotic features ([place]) 06/14/2015    Past Surgical History:  Procedure Laterality Date  . ABLATION ON ENDOMETRIOSIS    . CYST REMOVAL NECK     around 11 years ago /benign  . MULTIPLE TOOTH EXTRACTIONS       OB History   No obstetric history on file.      Home Medications    Prior to Admission medications   Medication Sig Start Date End Date Taking? Authorizing Provider  acetaminophen (TYLENOL) 500 MG tablet Take 1 tablet (500 mg total) by mouth every 6 (six) hours as needed. 09/09/19   Loura Halt A, NP  benztropine (COGENTIN) 1 MG tablet Take 1 tablet (1 mg total) by mouth 2 (two) times daily. 04/01/19   Johnn Hai, MD  carvedilol (COREG) 25 MG tablet Take 1 tablet (25 mg total) by mouth 2 (two) times daily with a meal. 04/01/19   Johnn Hai, MD  cloNIDine (CATAPRES) 0.1 MG tablet Take 1 tablet (0.1 mg total) by mouth 2 (two) times daily. 04/01/19   Johnn Hai, MD  fluconazole (DIFLUCAN) 150 MG tablet Take 300 mg by mouth every 7 (seven) days.    [provider]  haloperidol (HALDOL) 10 MG tablet Take 2 tablets (20 mg total) by mouth at bedtime. 04/01/19   Johnn Hai, MD  lithium carbonate 300 MG capsule 1 in am 2 at hs Patient taking differently: Take 300-600 mg by mouth 2 (two) times daily with a meal. 1 in am 2 at hs 04/01/19   Johnn Hai, MD  temazepam (RESTORIL) 30 MG capsule Take 1 capsule (30 mg total) by mouth at bedtime. 04/01/19   Johnn Hai, MD    Family History Family History  Problem Relation Age of Onset  . Arthritis Father   . Hyperlipidemia Father   . High blood pressure Father   . Diabetes Sister   . Diabetes Mother   . Diabetes Brother   . Mental illness Brother   . Alcohol abuse Paternal Uncle   . Alcohol abuse Paternal Grandfather   . Breast cancer Maternal Aunt   .  Breast cancer Paternal Aunt   . High blood pressure Sister   . Mental illness Other        runs in family    Social History Social History   Tobacco Use  . Smoking status: Never Smoker  . Smokeless tobacco: Never Used  Substance Use Topics  . Alcohol use: No  . Drug use: No     Allergies   Patient has no known allergies.   Review of Systems Review of Systems  All systems reviewed and negative, other than as noted in HPI.  Physical Exam Updated Vital Signs BP (!) 107/92 (BP Location: Left Arm)   Pulse 78   Temp 98.5 F (36.9 C) (Oral)   Resp 16   Ht 5\' 1"  (1.549 m)   Wt 65.8 kg   SpO2 96%   BMI 27.40 kg/m   Physical Exam Vitals signs and nursing note reviewed.  Constitutional:      General: She is not in acute distress.    Appearance: She is well-developed.  HENT:     Head: Normocephalic and atraumatic.  Eyes:     General:        Right eye: No discharge.        Left eye: No discharge.     Conjunctiva/sclera: Conjunctivae normal.  Neck:     Musculoskeletal: Neck supple.  Cardiovascular:     Rate and Rhythm: Normal rate and regular rhythm.     Heart sounds: Normal heart sounds. No murmur. No friction rub. No gallop.   Pulmonary:     Effort: Pulmonary effort is normal. No respiratory distress.     Breath sounds: Normal breath sounds.  Abdominal:     General: There is no distension.     Palpations: Abdomen is soft.     Tenderness: There is no abdominal tenderness.  Musculoskeletal:        General: No tenderness.  Skin:    General: Skin is warm and dry.  Neurological:     Mental Status: She is alert.  Psychiatric:     Comments: Speech loud. Laughing inappropriately at times.  Poor insight. Able to tell me that she is in the hospital but can't explain why.       ED Treatments / Results  Labs (all labs ordered are listed, but only abnormal results are displayed) Labs Reviewed  CBC WITH DIFFERENTIAL/PLATELET - Abnormal; Notable for the following  components:      Result Value   WBC 11.6 (*)    Neutro Abs 9.7 (*)    All other components within normal limits  COMPREHENSIVE METABOLIC PANEL - Abnormal; Notable for the following components:   Sodium 149 (*)    Chloride 115 (*)  Glucose, Bld 134 (*)    Creatinine, Ser 1.15 (*)    Calcium 11.0 (*)    Total Bilirubin 1.4 (*)    GFR calc non Af Amer 54 (*)    All other components within normal limits  URINALYSIS, ROUTINE W REFLEX MICROSCOPIC - Abnormal; Notable for the following components:   Color, Urine STRAW (*)    Ketones, ur 20 (*)    All other components within normal limits  RAPID URINE DRUG SCREEN, HOSP PERFORMED - Abnormal; Notable for the following components:   Benzodiazepines POSITIVE (*)    All other components within normal limits  TSH - Abnormal; Notable for the following components:   TSH 0.029 (*)    All other components within normal limits  T4, FREE - Abnormal; Notable for the following components:   Free T4 1.55 (*)    All other components within normal limits  AMMONIA - Abnormal; Notable for the following components:   Ammonia 37 (*)    All other components within normal limits  HIV ANTIBODY (ROUTINE TESTING W REFLEX) - Abnormal; Notable for the following components:   HIV Screen 4th Generation wRfx Non Reactive (*)    All other components within normal limits  CBC - Abnormal; Notable for the following components:   Hemoglobin 11.6 (*)    All other components within normal limits  CREATININE, SERUM - Abnormal; Notable for the following components:   Creatinine, Ser 1.14 (*)    GFR calc non Af Amer 54 (*)    All other components within normal limits  SARS CORONAVIRUS 2 (TAT 6-24 HRS)  ETHANOL  LITHIUM LEVEL  LITHIUM LEVEL  BASIC METABOLIC PANEL  T3, FREE  AMMONIA    EKG EKG Interpretation  Date/Time:  Saturday October 11 2019 16:00:06 EST Ventricular Rate:  73 PR Interval:    QRS Duration: 94 QT Interval:  481 QTC Calculation: 531 R  Axis:   6 Text Interpretation: Sinus rhythm LVH by voltage Abnormal T, consider ischemia, anterior leads Prolonged QT interval Confirmed by Virgel Manifold [id number]) on 10/11/2019 4:24:48 PM   Radiology No results found.  Procedures Procedures (including critical care time)  Medications Ordered in ED Medications - No data to display   Initial Impression / Assessment and Plan / ED Course  I have reviewed the triage vital signs and the nursing notes.  Pertinent labs & imaging results that were available during my care of the patient were reviewed by me and considered in my medical decision making (see chart for details).        53yF with bizarre behavior. Known hx of schizophrenia. Suspect she is not taking her medications. She has not specific complaints although she is not a reliable historian. Will medically clear. Suspect underlying reason for today's presentation is psychiatric.   Final Clinical Impressions(s) / ED Diagnoses   Final diagnoses:  Acute psychosis ([place])  Dehydration  Ataxia  High serum thyroxine (T4)  Hypernatremia    ED Discharge Orders    None       Virgel Manifold, MD 10/15/19 [phone]

## 2019-10-11 NOTE — ED Notes (Signed)
Sibling/[name] [phone] Significant other/Ervin  [phone]

## 2019-10-11 NOTE — ED Notes (Signed)
TTS machine placed in patient's room and assessment began.

## 2019-10-12 DIAGNOSIS — F312 Bipolar disorder, current episode manic severe with psychotic features: Secondary | ICD-10-CM

## 2019-10-12 LAB — SARS CORONAVIRUS 2 (TAT 6-24 HRS): SARS Coronavirus 2: NEGATIVE

## 2019-10-12 NOTE — Consult Note (Addendum)
Telepsych Consultation   Reason for Consult:  "bizarre behaviors" Referring Physician:  EDP Location of Patient: [facility] Location of Provider: [facility]  Patient Identification: Macady Torbet Ingle MRN:  [id number] Principal Diagnosis: Schizophrenia [facility]) Diagnosis:  Principal Problem:   Schizophrenia ([place]) Active Problems:   Bipolar I disorder, most recent episode manic, severe with psychotic features ([place])   Total Time spent with patient: 30 minutes  Subjective:   Myndee Creque Brownrigg is a 53 y.o. female patient admitted with "bizarre behaviors"   [place], 53 y.o., female patient presents to [facility] emergency department for evaluation of "bizarre behaviors."  Patient seen via telepsych by this provider; chart reviewed and consulted with Dr. Darleene Cleaver on 10/12/19.   On evaluation Aliecia Rodrick Orosz demonstrates difficulty in rendering her story. She is alert to self only and responds when called by name.  Her speech is pressured, garbled and at times non-coherent.  Her mood appears dysphoric which is not correlated by the smile on her face.  Albeit, she attempts to cooperate in the interview but is severely limited by thought blocking and disorganized thought processes.    Past Psychiatric History: Schizophrenia, bipolar affective disorder, current episode, mixed mood  Risk to Self: Suicidal Ideation: No Suicidal Intent: No Is patient at risk for suicide?: No Suicidal Plan?: No-Not Currently/Within Last 6 Months Access to Means: No What has been your use of drugs/alcohol within the last 12 months?: none How many times?: 1 Other Self Harm Risks: none known Triggers for Past Attempts: None known Intentional Self Injurious Behavior: None Risk to Others: Homicidal Ideation: No Thoughts of Harm to Others: No Current Homicidal Intent: No Current Homicidal Plan: No Access to Homicidal Means: No Identified Victim:  none History of harm to others?: No Assessment of Violence: None Noted Violent Behavior Description: (none) Does patient have access to weapons?: No Criminal Charges Pending?: No Does patient have a court date: No Prior Inpatient Therapy: Prior Inpatient Therapy: (UTA) Prior Outpatient Therapy: Prior Outpatient Therapy: Yes Prior Therapy Dates: 2019 Prior Therapy [facility]ilty[facility]Provider([facility]): [facility] Reason for Treatment: mental illness Does patient have an ACCT team?: Unknown Does patient have Intensive In-House Services?  : Unknown Does patient have [name] services? : Yes Does patient have P4CC services?: No  Past Medical History:  Past Medical History:  Diagnosis Date  . Bipolar affective disorder ([place])   . History of arthritis   . History of chicken pox   . History of depression   . History of genital warts   . history of heart murmur   . History of high blood pressure   . History of thyroid disease   . History of UTI   . Hypertension   . Low TSH level 07/13/2017  . Schizophrenia [facility])     Past Surgical History:  Procedure Laterality Date  . ABLATION ON ENDOMETRIOSIS    . CYST REMOVAL NECK     around 11 years ago /benign  . MULTIPLE TOOTH EXTRACTIONS     Family History:  Family History  Problem Relation Age of Onset  . Arthritis Father   . Hyperlipidemia Father   . High blood pressure Father   . Diabetes Sister   . Diabetes Mother   . Diabetes Brother   . Mental illness Brother   . Alcohol abuse Paternal Uncle   . Alcohol abuse Paternal Grandfather   . Breast cancer Maternal Aunt   . Breast cancer Paternal Aunt   . High  blood pressure Sister   . Mental illness Other        runs in family   Family Psychiatric  History: unknown Social History:  Social History   Substance and Sexual Activity  Alcohol Use No     Social History   Substance and Sexual Activity  Drug Use No    Social History   Socioeconomic History  . Marital status: Single    Spouse  name: Not on file  . Number of children: Not on file  . Years of education: Not on file  . Highest education level: Not on file  Occupational History  . Not on file  Social Needs  . Financial resource strain: Not on file  . Food insecurity    Worry: Not on file    Inability: Not on file  . Transportation needs    Medical: Not on file    Non-medical: Not on file  Tobacco Use  . Smoking status: Never Smoker  . Smokeless tobacco: Never Used  Substance and Sexual Activity  . Alcohol use: No  . Drug use: No  . Sexual activity: Not Currently  Lifestyle  . Physical activity    Days per week: Not on file    Minutes per session: Not on file  . Stress: Not on file  Relationships  . Social Herbalist on phone: Not on file    Gets together: Not on file    Attends religious service: Not on file    Active member of club or organization: Not on file    Attends meetings of clubs or organizations: Not on file    Relationship status: Not on file  Other Topics Concern  . Not on file  Social History Narrative  . Not on file   Additional Social History:    Allergies:  No Known Allergies  Labs:  Results for orders placed or performed during the hospital encounter of 10/11/19 (from the past 48 hour(s))  CBC with Differential     Status: Abnormal   Collection Time: 10/11/19  3:59 PM  Result Value Ref Range   WBC 11.6 (H) 4.0 - 10.5 K/uL   RBC 4.76 3.87 - 5.11 MIL/uL   Hemoglobin 13.5 12.0 - 15.0 g/dL   HCT 43.1 36.0 - 46.0 %   MCV 90.5 80.0 - 100.0 fL   MCH 28.4 26.0 - 34.0 pg   MCHC 31.3 30.0 - 36.0 g/dL   RDW 12.6 11.5 - 15.5 %   Platelets 273 150 - 400 K/uL   nRBC 0.0 0.0 - 0.2 %   Neutrophils Relative % 84 %   Neutro Abs 9.7 (H) 1.7 - 7.7 K/uL   Lymphocytes Relative 10 %   Lymphs Abs 1.1 0.7 - 4.0 K/uL   Monocytes Relative 6 %   Monocytes Absolute 0.7 0.1 - 1.0 K/uL   Eosinophils Relative 0 %   Eosinophils Absolute 0.0 0.0 - 0.5 K/uL   Basophils Relative 0 %    Basophils Absolute 0.0 0.0 - 0.1 K/uL   Immature Granulocytes 0 %   Abs Immature Granulocytes 0.04 0.00 - 0.07 K/uL    Comment: Performed at [facility], [place] [address], [place], [place] [address]  Comprehensive metabolic panel     Status: Abnormal   Collection Time: 10/11/19  3:59 PM  Result Value Ref Range   Sodium 149 (H) 135 - 145 mmol/L   Potassium 3.8 3.5 - 5.1 mmol/L   Chloride 115 (H)  98 - 111 mmol/L   CO2 22 22 - 32 mmol/L   Glucose, Bld 134 (H) 70 - 99 mg/dL   BUN 20 6 - 20 mg/dL   Creatinine, Ser 1.15 (H) 0.44 - 1.00 mg/dL   Calcium 11.0 (H) 8.9 - 10.3 mg/dL   Total Protein 7.7 6.5 - 8.1 g/dL   Albumin 4.4 3.5 - 5.0 g/dL   AST 15 15 - 41 U/L   ALT 16 0 - 44 U/L   Alkaline Phosphatase 88 38 - 126 U/L   Total Bilirubin 1.4 (H) 0.3 - 1.2 mg/dL   GFR calc non Af Amer 54 (L) >60 mL/min   GFR calc Af Amer >60 >60 mL/min   Anion gap 12 5 - 15    Comment: Performed at [facility], [place] [address], [place], [place] [address]  Ethanol     Status: None   Collection Time: 10/11/19  3:59 PM  Result Value Ref Range   Alcohol, Ethyl (B) <10 <10 mg/dL    Comment: (NOTE) Lowest detectable limit for serum alcohol is 10 mg/dL. For medical purposes only. Performed at [facility], [place] [address], [place], [place] [address]   SARS CORONAVIRUS 2 (TAT 6-24 HRS) Nasopharyngeal Nasopharyngeal Swab     Status: None   Collection Time: 10/11/19  3:59 PM   Specimen: Nasopharyngeal Swab  Result Value Ref Range   SARS Coronavirus 2 NEGATIVE NEGATIVE    Comment: (NOTE) SARS-CoV-2 target nucleic acids are NOT DETECTED. The SARS-CoV-2 RNA is generally detectable in upper and lower respiratory specimens during the acute phase of infection. Negative results do not preclude SARS-CoV-2 infection, do not rule out co-infections with other pathogens, and should not be used as the sole basis for treatment or other patient management  decisions. Negative results must be combined with clinical observations, patient history, and epidemiological information. The expected result is Negative. Fact Sheet for Patients: SugarRoll.be Fact Sheet for Healthcare Providers: https://www.woods-mathews.com/ This test is not yet approved or cleared by the [place] FDA and  has been authorized for detection and/or diagnosis of SARS-CoV-2 by FDA under an Emergency Use Authorization (EUA). This EUA will remain  in effect (meaning this test can be used) for the duration of the COVID-19 declaration under Section 56 4(b)(1) of the Act, 21 U.S.C. section 360bbb-3(b)(1), unless the authorization is terminated or revoked sooner. Performed at [facility], [place] [address], [place], [place] [address]   Lithium level     Status: None   Collection Time: 10/11/19  4:57 PM  Result Value Ref Range   Lithium Lvl 0.62 0.60 - 1.20 mmol/L    Comment: Performed at [facility], [place] [address], [place], [place] [address]  Urinalysis, Routine w reflex microscopic     Status: Abnormal   Collection Time: 10/11/19  9:21 PM  Result Value Ref Range   Color, Urine STRAW (A) YELLOW   APPearance CLEAR CLEAR   Specific Gravity, Urine 1.008 1.005 - 1.030   pH 7.0 5.0 - 8.0   Glucose, UA NEGATIVE NEGATIVE mg/dL   Hgb urine dipstick NEGATIVE NEGATIVE   Bilirubin Urine NEGATIVE NEGATIVE   Ketones, ur 20 (A) NEGATIVE mg/dL   Protein, ur NEGATIVE NEGATIVE mg/dL   Nitrite NEGATIVE NEGATIVE   Leukocytes,Ua NEGATIVE NEGATIVE    Comment: Performed at [facility] [address], [place], [place] [address]  Rapid urine drug screen (hospital performed)     Status: Abnormal  Collection Time: 10/11/19  9:21 PM  Result Value Ref Range   Opiates NONE DETECTED NONE DETECTED   Cocaine NONE DETECTED NONE DETECTED   Benzodiazepines POSITIVE (A) NONE DETECTED   Amphetamines NONE  DETECTED NONE DETECTED   Tetrahydrocannabinol NONE DETECTED NONE DETECTED   Barbiturates NONE DETECTED NONE DETECTED    Comment: (NOTE) DRUG SCREEN FOR MEDICAL PURPOSES ONLY.  IF CONFIRMATION IS NEEDED FOR ANY PURPOSE, NOTIFY LAB WITHIN 5 DAYS. LOWEST DETECTABLE LIMITS FOR URINE DRUG SCREEN Drug Class                     Cutoff (ng/mL) Amphetamine and metabolites    1000 Barbiturate and metabolites    200 Benzodiazepine                 [id number] Tricyclics and metabolites     300 Opiates and metabolites        300 Cocaine and metabolites        300 THC                            50 Performed at [facility], [place] [address], [place], [place] [address]     Medications:  Current Facility-Administered Medications  Medication Dose Route Frequency Provider Last Rate Last Dose  . benztropine (COGENTIN) tablet 1 mg  1 mg Oral BID Virgel Manifold, MD   1 mg at 10/12/19 1009  . carvedilol (COREG) tablet 25 mg  25 mg Oral BID WC Virgel Manifold, MD   25 mg at 10/12/19 0843  . cloNIDine (CATAPRES) tablet 0.1 mg  0.1 mg Oral BID Virgel Manifold, MD   0.1 mg at 10/12/19 1008  . haloperidol (HALDOL) tablet 20 mg  20 mg Oral QHS Virgel Manifold, MD   20 mg at 10/11/19 2227  . lithium carbonate capsule 300-600 mg  300-600 mg Oral BID WC Virgel Manifold, MD   300 mg at 10/12/19 0843  . temazepam (RESTORIL) capsule 30 mg  30 mg Oral QHS Virgel Manifold, MD   30 mg at 10/11/19 2227   Current Outpatient Medications  Medication Sig Dispense Refill  . benztropine (COGENTIN) 1 MG tablet Take 1 tablet (1 mg total) by mouth 2 (two) times daily. 60 tablet 1  . carvedilol (COREG) 25 MG tablet Take 1 tablet (25 mg total) by mouth 2 (two) times daily with a meal. 60 tablet 2  . cloNIDine (CATAPRES) 0.1 MG tablet Take 1 tablet (0.1 mg total) by mouth 2 (two) times daily. 60 tablet 11  . lithium carbonate 300 MG capsule 1 in am 2 at hs (Patient taking differently: Take 300-600 mg by mouth 2 (two) times  daily with a meal. 1 in am 2 at hs) 90 capsule 1  . temazepam (RESTORIL) 30 MG capsule Take 1 capsule (30 mg total) by mouth at bedtime. 30 capsule 1  . acetaminophen (TYLENOL) 500 MG tablet Take 1 tablet (500 mg total) by mouth every 6 (six) hours as needed. (Patient not taking: Reported on 10/12/2019) 30 tablet 0  . haloperidol (HALDOL) 10 MG tablet Take 2 tablets (20 mg total) by mouth at bedtime. 60 tablet 2    Musculoskeletal: Unable to assess via telepsych   Psychiatric Specialty Exam: Physical Exam  Constitutional: She is oriented to person, place, and time. She appears well-developed.  Eyes: Pupils are equal, round, and reactive to light.  Neck: Normal range of motion.  Cardiovascular:  Normal rate.  Respiratory: Effort normal.  Musculoskeletal: Normal range of motion.  Neurological: She is alert and oriented to person, place, and time.    Review of Systems  Psychiatric/Behavioral: Positive for hallucinations. The patient is nervous/anxious.     Blood pressure 131/78, pulse 76, temperature 98.5 F (36.9 C), temperature source Oral, resp. rate (!) 25, height 5\' 1"  (1.549 m), weight 65.8 kg, SpO2 100 %.Body mass index is 27.4 kg/m.  General Appearance: Bizarre  Eye Contact:  Fair  Speech:  Blocked, Garbled and Pressured  Volume:  Normal  Mood:  Anxious and Dysphoric  Affect:  Non-Congruent and Full Range  Thought Process:  Disorganized and Descriptions of Associations: Tangential  Orientation:  Other:  responds when called by name  Thought Content:  Illogical and Tangential  Suicidal Thoughts:  unable to assess  Homicidal Thoughts:  unable to assess  Memory:  Negative  Judgement:  Impaired  Insight:  Lacking  Psychomotor Activity:  Normal  Concentration:  Concentration: Poor and Attention Span: Poor  Recall:  Poor  Fund of Knowledge:  Fair  Language:  Poor  Akathisia:  Negative  Handed:  Right  AIMS (if indicated):     Assets:  Desire for Improvement Resilience   ADL's:  Intact wearing hospital scrubs,hair fairly groomed  Cognition:  Impaired,  Moderate  Sleep:   <6 hours     Treatment Plan Summary: Daily contact with patient to assess and evaluate symptoms and progress in treatment, Medication management and Plan Recommend inpatient hospitalization for medication management and symptom stabilization    Disposition: Recommend psychiatric Inpatient admission when medically cleared.  This service was provided via telemedicine using a 2-way, interactive audio and video technology.  Names of all persons participating in this telemedicine service and their role in this encounter. Name: Merlyn Lot Role: [place]  Name: Corena Pilgrim Role: Psychiatrist  Name: Dede Query Reise Role: Patient    Mallie Darting, NP 10/12/2019 4:02 PM   Patient seen face-to-face for psychiatric evaluation, chart reviewed and case discussed with the physician extender and developed treatment plan. Reviewed the information documented and agree with the treatment plan. Corena Pilgrim, MD

## 2019-10-12 NOTE — ED Notes (Signed)
Pt brief and linens saturated with urine, pt also stated she spilled drink on herself. Writer of this note changed pts linens, paper scrubs, brief and placed a pure wick on pt. Pt was also given 2 warm blankets and water to drink. Pt calm, cooperative and follows commands. Pt interacting with Probation officer and laughing. No distress observed.

## 2019-10-12 NOTE — ED Notes (Signed)
Patient being fed by NT at this time.

## 2019-10-12 NOTE — [facility] Counselor (Signed)
  [place]   Per [place] Provider, Merlyn Lot, NP  psychiatric inpatient admission is recommended when pt medically cleared.   Rachel Rison L. [place], [place], [facility] Therapeutic Triage Specialist  [phone]

## 2019-10-13 ENCOUNTER — Inpatient Hospital Stay (HOSPITAL_COMMUNITY): Admission: AD | Admit: 2019-10-13 | Payer: Medicare Other | Source: Intra-hospital | Admitting: Psychiatry

## 2019-10-13 ENCOUNTER — Emergency Department (HOSPITAL_COMMUNITY): Payer: Medicare Other

## 2019-10-13 ENCOUNTER — Encounter (HOSPITAL_COMMUNITY): Payer: Self-pay

## 2019-10-13 DIAGNOSIS — F312 Bipolar disorder, current episode manic severe with psychotic features: Secondary | ICD-10-CM | POA: Diagnosis not present

## 2019-10-13 MED ORDER — LORAZEPAM 2 MG/ML IJ SOLN
1.0000 mg | Freq: Once | INTRAMUSCULAR | Status: AC
  Administered 2019-10-13: 1 mg via INTRAMUSCULAR
  Filled 2019-10-13: qty 1

## 2019-10-13 NOTE — ED Notes (Signed)
Pt resting with eyes closed. No distress observed. Pt turned on left side, even and unlabored respirations.

## 2019-10-13 NOTE — ED Notes (Signed)
Brief checked, pt is dry. Pt resting with eyes closed, respirations even and unlabored breathing.

## 2019-10-13 NOTE — ED Notes (Signed)
Pt resting with eyes closed, even and unlabored respirations. Brief checked, pt is dry. No distress observed.

## 2019-10-13 NOTE — ED Notes (Signed)
Pt brief changed. Pt had large bowel movement. New brief placed on pt. Warm blankets provided. Pt denies pain at this time, no distress observed.

## 2019-10-13 NOTE — ED Notes (Signed)
Pt asleep with eyes closed, respirations even and unlabored with symmetrical chest rise and fall.

## 2019-10-13 NOTE — Discharge Summary (Signed)
  Patient to be transferred to [facility] for inpatient psychiatric treatment 

## 2019-10-13 NOTE — Progress Notes (Signed)
MD to bedside, neuro exam completed. MRI ordered as well as labs. Ativan given pre MRI, they are ready for her, will draw labs when pt returns to room.

## 2019-10-13 NOTE — BH Assessment (Addendum)
[place] Assessment Progress Note  Per [place] Abbot, MD, this pt requires psychiatric hospitalization.  Nonah Mattes, RN has assigned pt to [facility] Rm [id number]. Dr Dwyane Dee also finds that pt meets criteria for IVC, which she has initiated.  IVC documents have been faxed to [facility], and at 14:47 Maryann Alar confirms receipt.  She has since faxed Findings and Custody Order to this Probation officer.  At 15:18 I called [employer] and spoke to [employer], who took demographic information, agreeing to dispatch law enforcement to fill out Return of Service.  As of this writing, arrival of law enforcement is pending.  Pt's nurse, Caryl Pina, has been notified, and agrees to call report to [phone].  Pt is to be transported via Event organiser when the time comes.   Jalene Mullet, [place] [facility] Coordinator [phone]   Addendum:  Caryl Pina reports that when she called report to [facility], it was determined that pt's physical needs would exclude her from being able to program at [facility].  Per Dr Dwyane Dee, pt's level of functioning is currently far below baseline.  She requests that the EDP re-examine her.  EDP Gareth Morgan agrees to do so, and if it is determined that she is still medically clear, to order a chest x-ray to help facilitate transfer to a geriatric psychiatry specialty facility.  Caryl Pina has been notified.  Jalene Mullet, [place] Coordinator [phone]

## 2019-10-14 DIAGNOSIS — E0590 Thyrotoxicosis, unspecified without thyrotoxic crisis or storm: Secondary | ICD-10-CM | POA: Diagnosis present

## 2019-10-14 DIAGNOSIS — F312 Bipolar disorder, current episode manic severe with psychotic features: Secondary | ICD-10-CM | POA: Diagnosis not present

## 2019-10-14 DIAGNOSIS — F200 Paranoid schizophrenia: Secondary | ICD-10-CM | POA: Diagnosis not present

## 2019-10-14 LAB — CREATININE, SERUM
Creatinine, Ser: 1.14 mg/dL — ABNORMAL HIGH (ref 0.44–1.00)
GFR calc Af Amer: >60 mL/min (ref >60)
GFR calc non Af Amer: 54 mL/min — ABNORMAL LOW (ref >60)

## 2019-10-14 LAB — T4, FREE: Free T4: 1.55 ng/dL — ABNORMAL HIGH (ref 0.61–1.12)

## 2019-10-14 LAB — CBC
HCT: 37 % (ref 36.0–46.0)
Hemoglobin: 11.6 g/dL — ABNORMAL LOW (ref 12.0–15.0)
MCH: 28.2 pg (ref 26.0–34.0)
MCHC: 31.4 g/dL (ref 30.0–36.0)
MCV: 90 fL (ref 80.0–100.0)
Platelets: 220 10*3/uL (ref 150–400)
RBC: 4.11 MIL/uL (ref 3.87–5.11)
RDW: 12.3 % (ref 11.5–15.5)
WBC: 8.1 10*3/uL (ref 4.0–10.5)
nRBC: 0 % (ref 0.0–0.2)

## 2019-10-14 LAB — AMMONIA: Ammonia: 37 umol/L — ABNORMAL HIGH (ref 9–35)

## 2019-10-14 LAB — TSH: TSH: 0.029 u[IU]/mL — ABNORMAL LOW (ref 0.350–4.500)

## 2019-10-14 LAB — LITHIUM LEVEL: Lithium Lvl: 0.74 mmol/L (ref 0.60–1.20)

## 2019-10-14 MED ORDER — LITHIUM CARBONATE 300 MG PO CAPS
300.0000 mg | ORAL_CAPSULE | Freq: Every day | ORAL | Status: DC
  Administered 2019-10-14 – 2019-10-16 (×3): 300 mg via ORAL
  Filled 2019-10-14 (×3): qty 1

## 2019-10-14 MED ORDER — LITHIUM CARBONATE 300 MG PO CAPS
600.0000 mg | ORAL_CAPSULE | Freq: Every day | ORAL | Status: DC
  Administered 2019-10-14 – 2019-10-15 (×2): 600 mg via ORAL
  Filled 2019-10-14 (×3): qty 2

## 2019-10-14 MED ORDER — ENOXAPARIN SODIUM 40 MG/0.4ML ~~LOC~~ SOLN
40.0000 mg | SUBCUTANEOUS | Status: DC
  Administered 2019-10-14 – 2019-10-16 (×3): 40 mg via SUBCUTANEOUS
  Filled 2019-10-14 (×3): qty 0.4

## 2019-10-14 MED ORDER — ACETAMINOPHEN 650 MG RE SUPP: 650.0000 mg | Freq: Four times a day (QID) | RECTAL | Status: DC | PRN

## 2019-10-14 MED ORDER — ACETAMINOPHEN 325 MG PO TABS: 650.0000 mg | ORAL_TABLET | Freq: Four times a day (QID) | ORAL | Status: DC | PRN

## 2019-10-14 MED ORDER — ENSURE ENLIVE PO LIQD
237.0000 mL | Freq: Two times a day (BID) | ORAL | Status: DC
  Administered 2019-10-14 – 2019-10-16 (×5): 237 mL via ORAL

## 2019-10-14 MED ORDER — SODIUM CHLORIDE 0.45 % IV SOLN
INTRAVENOUS | Status: DC
  Administered 2019-10-14 (×4): via INTRAVENOUS

## 2019-10-14 MED ORDER — ADULT MULTIVITAMIN W/MINERALS CH
1.0000 | ORAL_TABLET | Freq: Every day | ORAL | Status: DC
  Administered 2019-10-15 – 2019-10-16 (×2): 1 via ORAL
  Filled 2019-10-14 (×2): qty 1

## 2019-10-14 NOTE — TOC Progression Note (Signed)
Transition of Care [facility]) - Progression Note    Patient Details  Name: Paula Dickson MRN: [id number] Date of Birth: 27-Oct-1966  Transition of Care [facility]) CM/SW Contact  Purcell Mouton, RN Phone Number: 10/14/2019, 12:05 PM  Clinical Narrative:    Damaris Schooner with Anastasia Fiedler Counselor concerning [facility] admission. Pt will need a Psych Consult to go to [facility] because of level of care yesterday. Spoke with pt's sister concerning Medicare Observation Status.   Expected Discharge Plan: Psychiatric Hospital Barriers to Discharge: Psych Bed not available  Expected Discharge Plan and Services Expected Discharge Plan: [place] arrangements for the past 2 months: Single Family Home                                       Social Determinants of Health (SDOH) Interventions    Readmission Risk Interventions No flowsheet data found.

## 2019-10-14 NOTE — Plan of Care (Signed)
  Problem: Education: Goal: Knowledge of General Education information will improve Description: Including pain rating scale, medication(s)/side effects and non-pharmacologic comfort measures Outcome: Progressing   Problem: Nutrition: Goal: Adequate nutrition will be maintained Outcome: Progressing   Problem: Elimination: Goal: Will not experience complications related to bowel motility Outcome: Progressing   Problem: Activity: Goal: Risk for activity intolerance will decrease Outcome: Not Progressing   Problem: Coping: Goal: Level of anxiety will decrease Outcome: Not Progressing

## 2019-10-14 NOTE — ED Provider Notes (Addendum)
11:45 PM  Assumed care from Dr. Billy Fischer.  Patient is a 53 year old female with history of schizophrenia currently awaiting Geri psychiatric placement.  Reevaluated by ED physician due to ataxia and tremulousness.  It appears she has had tremors documented in the past but seems possibly worse than baseline.  Psychiatry was worried about patient's significant decompensation and they know this patient well.  Initial labs unremarkable other than hypernatremia and mild elevated creatinine that may be secondary to dehydration.  Will begin to hydrate patient.  Urine showed no sign of infection.  MRI of the brain was obtained that showed no acute abnormality.  Repeat labs pending.   4:15 AM  Pt's TSH is 0.029 and free T4 is 1.55.  Looks like her TSH always runs low but her T4 is normally normal.  Repeat lithium level normal.  Ammonia slightly elevated at 37.  She has been hypertensive but not tachycardic or tachypneic here.  I do not think she is in thyroid storm.  Will discuss with medicine for further recommendations.  Per psychiatry's note earlier today, it appears that they are concerned because she appears significantly decompensated from her baseline and they know this patient well.  It does not appear that patient is on thyroid replacement medications.  4:30 AM Discussed patient's case with hospitalist, Dr. Marlowe Sax.  Discussed this case at length with the hospitalist.  She agrees given decompensation that patient will need medical admission for observation.  Admitting physician will place admission orders.   I reviewed all nursing notes, vitals, pertinent previous records and interpreted all EKGs, lab and urine results, imaging (as available).   EKG Interpretation  Date/Time:  Saturday October 11 2019 16:00:06 EST Ventricular Rate:  73 PR Interval:    QRS Duration: 94 QT Interval:  481 QTC Calculation: 531 R Axis:   6 Text Interpretation: Sinus rhythm LVH by voltage Abnormal T, consider  ischemia, anterior leads Prolonged QT interval Confirmed by Virgel Manifold [phone]) on 10/11/2019 4:24:48 PM Also confirmed by Virgel Manifold [phone]), editor Hattie Perch ([id number])  on 10/12/2019 8:57:00 AM         Ward, Delice Bison, DO 10/14/19 [place], Delice Bison, DO 10/14/19 [id number]

## 2019-10-14 NOTE — Progress Notes (Signed)
Initial Nutrition Assessment  DOCUMENTATION CODES:   Not applicable  INTERVENTION:   - Ensure Enlive po BID, each supplement provides 350 kcal and 20 grams of protein  - MVI with minerals daily  - Encourage adequate PO intake  NUTRITION DIAGNOSIS:   Increased nutrient needs related to acute illness as evidenced by estimated needs.  GOAL:   Patient will meet greater than or equal to 90% of their needs  MONITOR:   PO intake, Supplement acceptance, Labs, Weight trends  REASON FOR ASSESSMENT:   Malnutrition Screening Tool    ASSESSMENT:   53 year old female with PMH of schizophrenia, bipolar disorder, HTN. Pt admitted with worsening functional status awaiting geri-psych placement.   Unable to obtain diet and weight history at this time. Pt sleeping at time of RD visit and did not awaken to RD voice. Will re-attempt at follow-up.  Reviewed weight history in chart. Weight stable over the last 2.5 months. Last available weight prior to September 2020 is from August 2018.  RD will continue with order for Ensure Enlive to aid pt in meeting kcal and protein needs during admission. Will also order daily MVI.  Medications reviewed and include: Ensure Enlive BID IVF: 1/2NS @ 125 ml/hr  Labs reviewed.  NUTRITION - FOCUSED PHYSICAL EXAM:  Deferred. Pt sleeping soundly.  Diet Order:   Diet Order            Diet regular Room service appropriate? Yes; Fluid consistency: Thin  Diet effective now              EDUCATION NEEDS:   No education needs have been identified at this time  Skin:  Skin Assessment: Reviewed RN Assessment  Last BM:  no documented BM  Height:   Ht Readings from Last 1 Encounters:  10/14/19 5\' 1"  (1.549 m)    Weight:   Wt Readings from Last 1 Encounters:  10/14/19 63.4 kg    Ideal Body Weight:  47.7 kg  BMI:  Body mass index is 26.41 kg/m.  Estimated Nutritional Needs:   Kcal:  1450-1650  Protein:  70-85 grams  Fluid:  1.4-1.6  L    Gaynell Face, MS, RD, LDN Inpatient Clinical Dietitian Pager: [phone] Weekend/After Hours: [phone]

## 2019-10-14 NOTE — Care Management Obs Status (Signed)
[facility] NOTIFICATION   Patient Details  Name: Nataliee Rolstad Switzer MRN: [id number] Date of Birth: [birth year]   Medicare Observation Status Notification Given:  Yes    Purcell Mouton, RN 10/14/2019, 12:23 PM
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
## 2019-10-15 DIAGNOSIS — F200 Paranoid schizophrenia: Secondary | ICD-10-CM

## 2019-10-15 DIAGNOSIS — F312 Bipolar disorder, current episode manic severe with psychotic features: Secondary | ICD-10-CM | POA: Diagnosis not present

## 2019-10-15 LAB — AMMONIA: Ammonia: 18 umol/L (ref 9–35)

## 2019-10-15 LAB — BASIC METABOLIC PANEL
Anion gap: 4 — ABNORMAL LOW (ref 5–15)
BUN: 13 mg/dL (ref 6–20)
CO2: 25 mmol/L (ref 22–32)
Calcium: 9.8 mg/dL (ref 8.9–10.3)
Chloride: 116 mmol/L — ABNORMAL HIGH (ref 98–111)
Creatinine, Ser: 0.96 mg/dL (ref 0.44–1.00)
GFR calc Af Amer: >60 mL/min (ref >60)
GFR calc non Af Amer: >60 mL/min (ref >60)
Glucose, Bld: 121 mg/dL — ABNORMAL HIGH (ref 70–99)
Potassium: 3.7 mmol/L (ref 3.5–5.1)
Sodium: 145 mmol/L (ref 135–145)

## 2019-10-15 LAB — HIV ANTIBODY (ROUTINE TESTING W REFLEX): HIV Screen 4th Generation wRfx: NONREACTIVE — AB

## 2019-10-15 MED ORDER — ALUM & MAG HYDROXIDE-SIMETH 200-200-20 MG/5ML PO SUSP
30.0000 mL | ORAL | Status: DC | PRN
  Administered 2019-10-15 (×2): 30 mL via ORAL
  Filled 2019-10-15 (×2): qty 30
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
## 2019-10-15 NOTE — BH Specialist Note (Addendum)
Per Alene, patient accepted to [facility] 10/16/19 after 8am. She will need to report to the [facility] upon arrival. The accepting provider is Dr. Devra Dopp. The nursing report number is [phone].

## 2019-10-15 NOTE — TOC Progression Note (Signed)
Transition of Care [facility]) - Progression Note    Patient Details  Name: Paula Dickson MRN: [id number] Date of Birth: 1965-12-31  Transition of [facility] CM/SW [place], LCSW Phone Number: 10/15/2019, 4:04 PM  Clinical Narrative:   Patient does not have a bed at [facility] today. CSW spoke with Neoma Laming [facility]) she stated they will look at patient tomorrow morning and will reach back out to CSW when bed is available     Expected Discharge Plan: Psychiatric Hospital Barriers to Discharge: Psych Bed not available  Expected Discharge Plan and Services Expected Discharge Plan: [place] arrangements for the past 2 months: Single Family Home Expected Discharge Date: 10/15/19                                     Social Determinants of Health (SDOH) Interventions    Readmission Risk Interventions No flowsheet data found.

## 2019-10-15 NOTE — Evaluation (Signed)
Physical Therapy Evaluation Patient Details Name: Paula Dickson MRN: [id number] DOB: [birth year] Today's Date: 10/15/2019   History of Present Illness  Pt was admitted for worsening psychiatric illness. PMH:  schizophrenia, bipolar, HTN  Clinical Impression  Pt admitted with above diagnosis. +2 mod assist for sit to stand. Pt stood in stedy 1 1/5 minutes x 2 trials. Pt unable to weight shift in standing so did not attempt ambulation.  Pt currently with functional limitations due to the deficits listed below (see PT Problem List). Pt will benefit from skilled PT to increase their independence and safety with mobility to allow discharge to the venue listed below.       Follow Up Recommendations Supervision/Assistance - 24 hour(plan is to DC to psych facility per chart; pt requires physical assist for mobility, use of stedy recommended for transfers)    Equipment Recommendations  Wheelchair (measurements PT);Wheelchair cushion (measurements PT)    Recommendations for Other Services       Precautions / Restrictions Precautions Precautions: Fall Restrictions Weight Bearing Restrictions: No      Mobility  Bed Mobility               General bed mobility comments: pt up in recliner, she had slid down quite far in the recliner (to nearly supine position) upon my arrival, she required +2 max assist to scoot hips to back of recliner  Transfers Overall transfer level: Needs assistance   Transfers: Sit to/from Stand Sit to Stand: Mod assist;+2 physical assistance         General transfer comment: assist to rise and stabilize. Pt tends to lean posteriorly. Pt stands with flexed trunk, Verbal cues to stand erect. Unable to shift weight enough to march in place in standing, neither foot cleared the floor. Pt stood ~1 1/2 minute x 2 trials.  Ambulation/Gait             General Gait Details: NT- unable to weight shift in standing  Stairs            Wheelchair  Mobility    Modified Rankin (Stroke Patients Only)       Balance Overall balance assessment: Needs assistance Sitting-balance support: Feet supported;Single extremity supported Sitting balance-Leahy Scale: Poor Sitting balance - Comments: pt leaned slowly to L, R and posteriorly during adl; assist to correct     Standing balance-Leahy Scale: Poor                               Pertinent Vitals/Pain Pain Assessment: No/denies pain    Home Living Family/patient expects to be discharged to:: Private residence Living Arrangements: Alone                    Prior Function           Comments: per family, when pt is well she drives and does own meds.  When she isn't doing well, she doesn't believe she can walk     Hand Dominance        Extremity/Trunk Assessment   Upper Extremity Assessment Upper Extremity Assessment: Defer to OT evaluation    Lower Extremity Assessment Lower Extremity Assessment: Generalized weakness;Difficult to assess due to impaired cognition(B knee ext 4/5, pt unable to clear feet from floor to  march in place in standing (unclear if this is true functional picture or psychiatric issue))       Communication   Communication: No difficulties(speaks  quickly; difficult to understand at times)  Cognition Arousal/Alertness: Awake/alert Behavior During Therapy: WFL for tasks assessed/performed Overall Cognitive Status: Impaired/Different from baseline Area of Impairment: Attention;Safety/judgement                   Current Attention Level: Focused     Safety/Judgement: Decreased awareness of safety     General Comments: pt needed max cues to stay focused; step by step cues given for sit to stand, encouraged her not to talk as she distracts herself, continuous redirecting required      General Comments      Exercises     Assessment/Plan    PT Assessment Patient needs continued PT services  PT Problem List  Decreased strength;Decreased activity tolerance;Decreased balance;Decreased mobility;Decreased cognition;Decreased safety awareness       PT Treatment Interventions Gait training;Functional mobility training;Therapeutic exercise;Therapeutic activities;Patient/family education    PT Goals (Current goals can be found in the Care Plan section)  Acute Rehab PT Goals Patient Stated Goal: none stated PT Goal Formulation: Patient unable to participate in goal setting Time For Goal Achievement: 10/29/19 Potential to Achieve Goals: Fair    Frequency Min 2X/week   Barriers to discharge        Co-evaluation               AM-PAC PT "6 Clicks" Mobility  Outcome Measure Help needed turning from your back to your side while in a flat bed without using bedrails?: A Little Help needed moving from lying on your back to sitting on the side of a flat bed without using bedrails?: A Little Help needed moving to and from a bed to a chair (including a wheelchair)?: A Lot Help needed standing up from a chair using your arms (e.g., wheelchair or bedside chair)?: A Lot Help needed to walk in hospital room?: Total Help needed climbing 3-5 steps with a railing? : Total 6 Click Score: 12    End of Session Equipment Utilized During Treatment: Gait belt Activity Tolerance: Patient tolerated treatment well;No increased pain Patient left: in chair;with call bell/phone within reach;with nursing/sitter in room Nurse Communication: Mobility status;Need for lift equipment PT Visit Diagnosis: Unsteadiness on feet (R26.81);Muscle weakness (generalized) (M62.81);Difficulty in walking, not elsewhere classified (R26.2)    Time: [id number] PT Time Calculation (min) (ACUTE ONLY): 21 min   Charges:   PT Evaluation $PT Eval Moderate Complexity: 1 Mod          Philomena Doheny PT 10/15/2019  Acute Rehabilitation Services Pager [phone] Office [phone]

## 2019-10-15 NOTE — Plan of Care (Signed)
  Problem: Education: Goal: Knowledge of General Education information will improve Description: Including pain rating scale, medication(s)/side effects and non-pharmacologic comfort measures Outcome: Progressing   Problem: Health Behavior/Discharge Planning: Goal: Ability to manage health-related needs will improve Outcome: Progressing   Problem: Clinical Measurements: Goal: Ability to maintain clinical measurements within normal limits will improve Outcome: Progressing Goal: Will remain free from infection Outcome: Completed/Met Goal: Diagnostic test results will improve Outcome: Progressing Goal: Respiratory complications will improve Outcome: Completed/Met Goal: Cardiovascular complication will be avoided Outcome: Progressing   Problem: Activity: Goal: Risk for activity intolerance will decrease Outcome: Progressing   Problem: Nutrition: Goal: Adequate nutrition will be maintained Outcome: Completed/Met   Problem: Coping: Goal: Level of anxiety will decrease Outcome: Progressing   Problem: Elimination: Goal: Will not experience complications related to bowel motility Outcome: Progressing Goal: Will not experience complications related to urinary retention Outcome: Completed/Met   Problem: Pain Managment: Goal: General experience of comfort will improve Outcome: Completed/Met   Problem: Safety: Goal: Ability to remain free from injury will improve Outcome: Progressing   Problem: Skin Integrity: Goal: Risk for impaired skin integrity will decrease Outcome: Completed/Met

## 2019-10-15 NOTE — Evaluation (Signed)
Occupational Therapy Evaluation Patient Details Name: Paula Dickson MRN: [id number] DOB: Mar 23, 1966 Today's Date: 10/15/2019    History of Present Illness Pt was admitted for worsening psychiatric illness. PMH:  schizophrenia, bipolar, HTN   Clinical Impression   This 53 year old female was admitted for the above.  At baseline, when she is well, she is independent with IADLs including driving.  Pt presented with very poor attention which impacts all activities.  She also demonstrated poor balance during session. She required min to total A for ADLs with mostly mod to max cues and mod +2 for safety with SPT to chair. Will follow in acute setting with min A level goals    Follow Up Recommendations  (per chart, pysch admission)    Equipment Recommendations  3 in 1 bedside commode    Recommendations for Other Services       Precautions / Restrictions Precautions Precautions: Fall Restrictions Weight Bearing Restrictions: No      Mobility Bed Mobility               General bed mobility comments: min A to EOB with extra time, to weight shift R hip forward. Pt used rail and HOB was raised  Transfers Overall transfer level: Needs assistance Equipment used: Rolling walker (2 wheeled) Transfers: Sit to/from [facility] Sit to Stand: Mod assist (+2 if performing ADL) Stand pivot transfers: Mod assist+2 safety       General transfer comment: assist to rise and stabilize. Pt tends to lean posteriorly.  Assist to move walker, weight shift and max cues to step to chair.  Assist to control descent and shift hips away from arm rest    Balance Overall balance assessment: Needs assistance Sitting-balance support: Feet supported;Single extremity supported Sitting balance-Leahy Scale: Poor Sitting balance - Comments: pt leaned slowly to L, R and posteriorly during adl; assist to correct     Standing balance-Leahy Scale: Poor                             ADL either performed or assessed with clinical judgement   ADL Overall ADL's : Needs assistance/impaired Eating/Feeding: Supervision/ safety(cues)   Grooming: Min A(mod cues)   Upper Body Bathing: Moderate assistance(max cues)   Lower Body Bathing: Total assistance;+2 for safety/equipment;Sit to/from stand(mod cues for safety)   Upper Body Dressing : Maximal assistance   Lower Body Dressing: Total assistance;+2 for safety/equipment;Sit to/from stand   Toilet Transfer: Moderate assistance;+2 for safety/equipment;Stand-pivot;RW(to chair)             General ADL Comments: very poor attention limits adl ability.  SPT to chair with mod A, extra time, assist to weight shift and max cues     Vision         Perception     Praxis      Pertinent Vitals/Pain Pain Assessment: No/denies pain     Hand Dominance     Extremity/Trunk Assessment Upper Extremity Assessment Upper Extremity Assessment: Generalized weakness           Communication Communication Communication: (speaks quickly; difficult to understand at times)   Cognition Arousal/Alertness: Awake/alert Behavior During Therapy: WFL for tasks assessed/performed Overall Cognitive Status: Impaired/Different from baseline Area of Impairment: Attention;Safety/judgement                   Current Attention Level: Focused     Safety/Judgement: Decreased awareness of safety     General  Comments: pt needed max cues during adl; step by step cues given for body parts to wash and encouraged her not to talk as she distracts herself   General Comments       Exercises     Shoulder Instructions      Home Living Family/patient expects to be discharged to:: Private residence Living Arrangements: Alone                                      Prior Functioning/Environment          Comments: per family, when pt is well she drives and does own meds.  When she isn't doing well, she  doesn't believe she can walk        OT Problem List: Decreased strength;Decreased activity tolerance;Impaired balance (sitting and/or standing);Decreased cognition;Decreased safety awareness      OT Treatment/Interventions: Self-care/ADL training;Therapeutic exercise;Energy conservation;DME and/or AE instruction;Therapeutic activities;Cognitive remediation/compensation;Patient/family education;Balance training    OT Goals(Current goals can be found in the care plan section) Acute Rehab OT Goals Patient Stated Goal: none stated OT Goal Formulation: With patient Time For Goal Achievement: 10/29/19 Potential to Achieve Goals: Good ADL Goals Pt Will Transfer to Toilet: with min assist;stand pivot transfer;bedside commode Pt Will Perform Toileting - Clothing Manipulation and hygiene: with min assist;sit to/from stand Additional ADL Goal #1: pt will perform adl with min A and min cues Additional ADL Goal #2: pt will attend to adl, strengthening or balance task for 3 minutes without cues  OT Frequency: Min 2X/week   Barriers to D/C:            Co-evaluation              AM-PAC OT "6 Clicks" Daily Activity     Outcome Measure Help from another person eating meals?: A Little Help from another person taking care of personal grooming?: A Little Help from another person toileting, which includes using toliet, bedpan, or urinal?: Total Help from another person bathing (including washing, rinsing, drying)?: A Lot Help from another person to put on and taking off regular upper body clothing?: A Lot Help from another person to put on and taking off regular lower body clothing?: Total 6 Click Score: 12   End of Session Nurse Communication: (use stedy for back to bed)  Activity Tolerance: Patient tolerated treatment well Patient left: in chair;with call bell/phone within reach;with nursing/sitter in room  OT Visit Diagnosis: Unsteadiness on feet (R26.81);Muscle weakness (generalized)  (M62.81);Cognitive communication deficit (R41.841)                [id number] OT Time Calculation (min): 24 min Charges:  OT General Charges $OT Visit: 1 Visit OT Evaluation $OT Eval Moderate Complexity: 1 Mod OT Treatments $Self Care/Home Management : 8-22 mins  Lesle Chris, OTR/L Acute Rehabilitation Services [phone] [facility] pager [phone] office 10/15/2019  Aron Needles 10/15/2019, 10:07 AM

## 2019-10-16 DIAGNOSIS — F312 Bipolar disorder, current episode manic severe with psychotic features: Secondary | ICD-10-CM | POA: Diagnosis not present

## 2019-10-16 LAB — T3, FREE: T3, Free: 2.8 pg/mL (ref 2.0–4.4)

## 2019-10-16 MED ORDER — PHENOL 1.4 % MT LIQD
1.0000 | OROMUCOSAL | Status: DC | PRN
  Filled 2019-10-16: qty 177

## 2019-10-16 NOTE — TOC Progression Note (Signed)
Transition of Care [facility]) - Progression Note    Patient Details  Name: Paula Dickson MRN: [id number] Date of Birth: [birth year]  [facility]) CM/SW Contact  Purcell Mouton, RN Phone Number: 10/16/2019, 3:10 PM  Clinical Narrative:    Pt's sister [name] aware of pt's transfer. Spoke to [employer] via telephone.   Expected Discharge Plan: Psychiatric Hospital Barriers to Discharge: Psych Bed not available  Expected Discharge Plan and Services Expected Discharge Plan: [place] arrangements for the past 2 months: Single Family Home Expected Discharge Date: 10/16/19                                     Social Determinants of Health (SDOH) Interventions    Readmission Risk Interventions No flowsheet data found.

## 2019-10-16 NOTE — Progress Notes (Signed)
Patient seen and examined the bedside this morning.  No new change since yesterday.  DC orders and summary has already been placed.  She remains hemodynamically stable.  She is waiting for transfer to behavioral health Hospital.

## 2019-10-16 NOTE — TOC Progression Note (Signed)
Transition of Care [facility]) - Progression Note    Patient Details  Name: Paula Dickson MRN: [id number] Date of Birth: [birth year]  Transition of Care [facility]) CM/SW Contact  Purcell Mouton, RN Phone Number: 10/16/2019, 1:45 PM  Clinical Narrative:    [name] called [phone].  Pt being transported to [facility] [place].    Expected Discharge Plan: Psychiatric Hospital Barriers to Discharge: Psych Bed not available  Expected Discharge Plan and Services Expected Discharge Plan: [place] arrangements for the past 2 months: Single Family Home Expected Discharge Date: 10/16/19                                     Social Determinants of Health (SDOH) Interventions    Readmission Risk Interventions No flowsheet data found.

## 2019-10-16 NOTE — Progress Notes (Signed)
Report called to Andris Flurry [phone]

## 2019-10-16 NOTE — Progress Notes (Signed)
Provider contacted related to DC. Pt is unable to be discharged with current IVC order. Will DC for DC

## 2019-10-22 NOTE — ED Provider Notes (Signed)
  Physical Exam  BP 129/71 (BP Location: Left Arm)   Pulse 75   Temp 98.5 F (36.9 C) (Oral)   Resp 16   Ht 5\' 1"  (1.549 m)   Wt 63.4 kg   SpO2 98%   BMI 26.41 kg/m   Physical Exam  ED Course/Procedures     Procedures  MDM  Received care of patient at 3 PM.  Please see previous notes for history, physical, exam, and prior care.  Briefly this is a 53 year old female who is awaiting psychiatric evaluation due to concern for concern for worsening schizophrenia and bipolar.  She had been previously medically cleared.  On psychiatry's evaluation of patient, they feel she meets criteria for inpatient treatment, however also note that she appears to be worse at her baseline.  They report she is normally ambulatory, has been unable to ambulate today, and has more significant tremors.  The family had reported some chronic difficulty with walking and tremors, but noted that symptoms were significantly worse, and per providers that are familiar with patient, report that her inability to walk is new, and requesting reevaluation, and if no medical cause found she will require geri-psych placement.  On my evaluation, patient does have tremor, and is unable to stand or walk. Ordered MRI brain, reordered lithium level, thyroid studies, as well as ammonia to evaluate for other medical etiology of this change.  MRI without signs of CVA, however is limited.  However, given history of having similar symptoms, do not feel symptoms in setting of negative MRI are consistent with CVA.  Care signed out to Dr. Leonides Schanz with other results pending.     Gareth Morgan, MD 10/23/19 [phone]

## 2019-11-11 ENCOUNTER — Emergency Department (HOSPITAL_COMMUNITY): Payer: Medicare Other

## 2019-11-11 ENCOUNTER — Other Ambulatory Visit: Payer: Self-pay

## 2019-11-11 ENCOUNTER — Encounter (HOSPITAL_COMMUNITY): Payer: Self-pay

## 2019-11-11 ENCOUNTER — Emergency Department (HOSPITAL_COMMUNITY)
Admission: EM | Admit: 2019-11-11 | Discharge: 2019-11-11 | Disposition: A | Discharge status: Home / Self Care | Payer: Medicare Other | Attending: Emergency Medicine | Admitting: Emergency Medicine

## 2019-11-11 DIAGNOSIS — R251 Tremor, unspecified: Secondary | ICD-10-CM | POA: Diagnosis not present

## 2019-11-11 DIAGNOSIS — Z79899 Other long term (current) drug therapy: Secondary | ICD-10-CM | POA: Diagnosis not present

## 2019-11-11 DIAGNOSIS — I10 Essential (primary) hypertension: Secondary | ICD-10-CM | POA: Diagnosis not present

## 2019-11-11 DIAGNOSIS — N390 Urinary tract infection, site not specified: Secondary | ICD-10-CM | POA: Diagnosis not present

## 2019-11-11 DIAGNOSIS — E039 Hypothyroidism, unspecified: Secondary | ICD-10-CM | POA: Diagnosis not present

## 2019-11-11 DIAGNOSIS — R531 Weakness: Secondary | ICD-10-CM | POA: Diagnosis present

## 2019-11-11 LAB — COMPREHENSIVE METABOLIC PANEL
ALT: 19 U/L (ref 0–44)
AST: 15 U/L (ref 15–41)
Albumin: 4 g/dL (ref 3.5–5.0)
Alkaline Phosphatase: 106 U/L (ref 38–126)
Anion gap: 8 (ref 5–15)
BUN: 8 mg/dL (ref 6–20)
CO2: 27 mmol/L (ref 22–32)
Calcium: 10.9 mg/dL — ABNORMAL HIGH (ref 8.9–10.3)
Chloride: 105 mmol/L (ref 98–111)
Creatinine, Ser: 1.25 mg/dL — ABNORMAL HIGH (ref 0.44–1.00)
GFR calc Af Amer: 56 mL/min — ABNORMAL LOW (ref >60)
GFR calc non Af Amer: 48 mL/min — ABNORMAL LOW (ref >60)
Glucose, Bld: 107 mg/dL — ABNORMAL HIGH (ref 70–99)
Potassium: 3.7 mmol/L (ref 3.5–5.1)
Sodium: 140 mmol/L (ref 135–145)
Total Bilirubin: 0.9 mg/dL (ref 0.3–1.2)
Total Protein: 7 g/dL (ref 6.5–8.1)

## 2019-11-11 LAB — URINALYSIS, ROUTINE W REFLEX MICROSCOPIC
Bilirubin Urine: NEGATIVE
Glucose, UA: NEGATIVE mg/dL
Ketones, ur: NEGATIVE mg/dL
Nitrite: NEGATIVE
Protein, ur: NEGATIVE mg/dL
Specific Gravity, Urine: 1.003 — ABNORMAL LOW (ref 1.005–1.030)
WBC, UA: >50 WBC/hpf — ABNORMAL HIGH (ref 0–5)
pH: 7 (ref 5.0–8.0)

## 2019-11-11 LAB — CBC WITH DIFFERENTIAL/PLATELET
Abs Immature Granulocytes: 0.02 10*3/uL (ref 0.00–0.07)
Basophils Absolute: 0.1 10*3/uL (ref 0.0–0.1)
Basophils Relative: 1 %
Eosinophils Absolute: 0.2 10*3/uL (ref 0.0–0.5)
Eosinophils Relative: 2 %
HCT: 41.5 % (ref 36.0–46.0)
Hemoglobin: 13.2 g/dL (ref 12.0–15.0)
Immature Granulocytes: 0 %
Lymphocytes Relative: 19 %
Lymphs Abs: 1.5 10*3/uL (ref 0.7–4.0)
MCH: 28.4 pg (ref 26.0–34.0)
MCHC: 31.8 g/dL (ref 30.0–36.0)
MCV: 89.2 fL (ref 80.0–100.0)
Monocytes Absolute: 0.6 10*3/uL (ref 0.1–1.0)
Monocytes Relative: 8 %
Neutro Abs: 5.8 10*3/uL (ref 1.7–7.7)
Neutrophils Relative %: 70 %
Platelets: 234 10*3/uL (ref 150–400)
RBC: 4.65 MIL/uL (ref 3.87–5.11)
RDW: 13.2 % (ref 11.5–15.5)
WBC: 8.1 10*3/uL (ref 4.0–10.5)
nRBC: 0 % (ref 0.0–0.2)

## 2019-11-11 LAB — ETHANOL: Alcohol, Ethyl (B): <10 mg/dL (ref <10)

## 2019-11-11 LAB — RAPID URINE DRUG SCREEN, HOSP PERFORMED
Amphetamines: NOT DETECTED
Barbiturates: NOT DETECTED
Benzodiazepines: POSITIVE — AB
Cocaine: NOT DETECTED
Opiates: NOT DETECTED
Tetrahydrocannabinol: NOT DETECTED

## 2019-11-11 MED ORDER — CEPHALEXIN 500 MG PO CAPS: 500.0000 mg | ORAL_CAPSULE | Freq: Two times a day (BID) | ORAL | 0 refills | Status: AC

## 2019-11-11 MED ORDER — SODIUM CHLORIDE 0.9 % IV BOLUS
1000.0000 mL | Freq: Once | INTRAVENOUS | Status: AC
  Administered 2019-11-11: 1000 mL via INTRAVENOUS

## 2019-11-11 MED ORDER — SODIUM CHLORIDE 0.9 % IV SOLN
1.0000 g | Freq: Once | INTRAVENOUS | Status: AC
  Administered 2019-11-11: 14:00:00 1 g via INTRAVENOUS
  Filled 2019-11-11: qty 10

## 2019-11-11 NOTE — ED Triage Notes (Addendum)
Pt BIB EMS from home. Pt c/o weakness, tremors. Pt states she cannot take care of herself. Pt has hx of bipolar, schizophrenia, HTN.   98% RA CBG 134

## 2019-11-11 NOTE — ED Notes (Addendum)
Pt arrived by [facility] and informed RN that she would need PTAR home, pt informed RN that she had a key to her house. Patients contact, [name], called with no answer.

## 2019-11-11 NOTE — ED Provider Notes (Signed)
[facility] DEPT Provider Note   CSN: [id number] Arrival date & time: 11/11/19  1145     History Chief Complaint  Patient presents with  . Weakness  . Tremors    Paula Dickson is a 53 y.o. female.  HPI    Patient presents from home with concern of shakiness. Patient is awake, alert, speaking clearly offering her own history.  She does have a history of psychiatric disease which she acknowledges, but states that she is doing generally well at home. Over the last 2 or 3 days she developed shakiness, without falling, without syncope, without focal pain. Enteritis or psychiatric disease, she states that she has had no recent complications, issues, has no suicidal ideation. No clear precipitant for this illness, and since onset no clear alleviating or exacerbating factors. Past Medical History:  Diagnosis Date  . Bipolar affective disorder ([place])   . History of arthritis   . History of chicken pox   . History of depression   . History of genital warts   . history of heart murmur   . History of high blood pressure   . History of thyroid disease   . History of UTI   . Hypertension   . Low TSH level 07/13/2017  . Schizophrenia [facility])     Patient Active Problem List   Diagnosis Date Noted  . Hyperthyroidism 10/14/2019  . Medication side effect, initial encounter   . Schizoaffective disorder ([place]) 03/13/2019  . Severe mixed bipolar 1 disorder without psychosis ([place]) 03/13/2019  . Bipolar affective disorder, current episode mixed ([place]) 11/01/2018  . Toxic encephalopathy 07/14/2017  . Suicide attempt ([place]) 07/13/2017  . Low TSH level 07/13/2017  . Overdose of psychotropic 07/12/2017  . Valproic acid toxicity 07/12/2017  . Hyperammonemia ([place]) 07/12/2017  . Schizophrenia ([place]) 07/12/2017  . Bipolar disorder, most recent episode depressed ([place]) 07/12/2017  . Insomnia 12/27/2015  . Abnormal urinalysis 12/27/2015  . 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 Bipolar I disorder, most recent episode manic, severe with psychotic features ([place]) 06/14/2015    Past Surgical History:  Procedure Laterality Date  . ABLATION ON ENDOMETRIOSIS    . CYST REMOVAL NECK     around 11 years ago /benign  . MULTIPLE TOOTH EXTRACTIONS       OB History   No obstetric history on file.     Family History  Problem Relation Age of Onset  . Arthritis Father   . Hyperlipidemia Father   . High blood pressure Father   . Diabetes Sister   . Diabetes Mother   . Diabetes Brother   . Mental illness Brother   . Alcohol abuse Paternal Uncle   . Alcohol abuse Paternal Grandfather   . Breast cancer Maternal Aunt   . Breast cancer Paternal Aunt   . High blood pressure Sister   . Mental illness Other        runs in family    Social History   Tobacco Use  . Smoking status: Never Smoker  . Smokeless tobacco: Never Used  Substance Use Topics  . Alcohol use: No  . Drug use: No    Home Medications Prior to Admission medications   Medication Sig Start Date End Date Taking? Authorizing Provider  acetaminophen (TYLENOL) 500 MG tablet Take 1 tablet (500 mg total) by mouth every 6 (six) hours as needed. Patient not taking: Reported on 10/12/2019 09/09/19   Loura Halt A, NP  benztropine (COGENTIN) 1 MG tablet Take 1  tablet (1 mg total) by mouth 2 (two) times daily. 04/01/19   Johnn Hai, MD  carvedilol (COREG) 25 MG tablet Take 1 tablet (25 mg total) by mouth 2 (two) times daily with a meal. 04/01/19   Johnn Hai, MD  cloNIDine (CATAPRES) 0.1 MG tablet Take 1 tablet (0.1 mg total) by mouth 2 (two) times daily. 04/01/19   Johnn Hai, MD  haloperidol (HALDOL) 10 MG tablet Take 2 tablets (20 mg total) by mouth at bedtime. 04/01/19   Johnn Hai, MD  lithium carbonate 300 MG capsule 1 in am 2 at hs Patient taking differently: Take 300-600 mg by mouth 2 (two) times daily with a meal. 1 in am 2 at hs 04/01/19   Johnn Hai, MD  temazepam (RESTORIL) 30 MG  capsule Take 1 capsule (30 mg total) by mouth at bedtime. 04/01/19   Johnn Hai, MD    Allergies    Patient has no known allergies.  Review of Systems   Review of Systems  Constitutional:       Per HPI, otherwise negative  HENT:       Per HPI, otherwise negative  Respiratory:       Per HPI, otherwise negative  Cardiovascular:       Per HPI, otherwise negative  Gastrointestinal: Negative for vomiting.  Endocrine:       Negative aside from HPI  Genitourinary:       Neg aside from HPI   Musculoskeletal:       Per HPI, otherwise negative  Skin: Negative.   Neurological: Positive for tremors. Negative for syncope.  Psychiatric/Behavioral:       Per HPI no notable recent changes.    Physical Exam Updated Vital Signs BP (!) 143/93   Pulse 74   Temp 99.1 F (37.3 C) (Oral)   Resp 16   SpO2 100%   Physical Exam Vitals and nursing note reviewed.  Constitutional:      General: She is not in acute distress.    Appearance: She is well-developed.  HENT:     Head: Normocephalic and atraumatic.  Eyes:     Conjunctiva/sclera: Conjunctivae normal.  Cardiovascular:     Rate and Rhythm: Normal rate and regular rhythm.  Pulmonary:     Effort: Pulmonary effort is normal. No respiratory distress.     Breath sounds: Normal breath sounds. No stridor.  Abdominal:     General: There is no distension.  Skin:    General: Skin is warm and dry.  Neurological:     Mental Status: She is alert and oriented to person, place, and time.     Cranial Nerves: No cranial nerve deficit.     Comments: Occasional intermittent nonsustained shakiness left hand greater than right, otherwise unremarkable neurologic exam.  Psychiatric:     Comments: Awake alert appropriately interactive with insight into her psychiatric disease, no current complaints.     ED Results / Procedures / Treatments   Labs (all labs ordered are listed, but only abnormal results are displayed) Labs Reviewed  COMPREHENSIVE  METABOLIC PANEL - Abnormal; Notable for the following components:      Result Value   Glucose, Bld 107 (*)    Creatinine, Ser 1.25 (*)    Calcium 10.9 (*)    GFR calc non Af Amer 48 (*)    GFR calc Af Amer 56 (*)    All other components within normal limits  URINALYSIS, ROUTINE W REFLEX MICROSCOPIC - Abnormal; Notable for the following components:  APPearance CLOUDY (*)    Specific Gravity, Urine 1.003 (*)    Hgb urine dipstick SMALL (*)    Leukocytes,Ua LARGE (*)    WBC, UA >50 (*)    Bacteria, UA FEW (*)    All other components within normal limits  RAPID URINE DRUG SCREEN, HOSP PERFORMED - Abnormal; Notable for the following components:   Benzodiazepines POSITIVE (*)    All other components within normal limits  ETHANOL  CBC WITH DIFFERENTIAL/PLATELET  LITHIUM LEVEL   Radiology DG Chest 1 View  Result Date: 11/11/2019 CLINICAL DATA:  Weakness. EXAM: CHEST  1 VIEW COMPARISON:  Chest x-ray 10/13/2019. FINDINGS: Mediastinum and hilar structures normal. Borderline cardiomegaly with mild pulmonary venous congestion, this may be secondary to AP technique. No focal infiltrate. No pleural effusion or pneumothorax. No acute bony abnormality. IMPRESSION: Borderline cardiomegaly with mild pulmonary venous congestion, this may be secondary to AP technique. 2.  Low lung volumes.  No acute pulmonary disease. Electronically Signed   By: Marcello Moores  Register   On: 11/11/2019 12:43    Procedures Procedures (including critical care time)  Medications Ordered in ED Medications  sodium chloride 0.9 % bolus 1,000 mL (has no administration in time range)  cefTRIAXone (ROCEPHIN) 1 g in sodium chloride 0.9 % 100 mL IVPB (has no administration in time range)    ED Course  I have reviewed the triage vital signs and the nursing notes.  Pertinent labs & imaging results that were available during my care of the patient were reviewed by me and considered in my medical decision making (see chart for  details).    MDM Rules/Calculators/A&P                      Chart review notable for hospitalizations for psychiatric disease in the past, including one requiring internal medicine consult due to shakiness, similar to today. Shakiness at that point resolved, there was no clear etiology elucidated.   Pleasant female with history of psychiatric disease presents from home with shakiness. She is awake, alert, with insight into her presentation, has no focal pain, no focal neurologic deficiencies Shakiness. Patient is on lithium, and given her substantial medical issues, broad differential considered, but initial findings were reassuring, no evidence for pneumonia, no overt infection There are some suspicion for occult UTI given the patient's abnormal urinalysis, however. With this, as well as evidence for dehydration, patient received fluids, ceftriaxone. Without evidence for bacteremia, sepsis, without overt psychiatric complaints, with no evident evidence for neurologic dysfunction, patient is appropriate for discharge with close outpatient follow-up. Final Clinical Impression(s) / ED Diagnoses Final diagnoses:  Shakiness  Lower urinary tract infectious disease    Rx / DC Orders ED Discharge Orders         Ordered    cephALEXin (KEFLEX) 500 MG capsule  2 times daily     11/11/19 1637           Carmin Muskrat, MD 11/11/19 1703

## 2019-11-11 NOTE — ED Notes (Signed)
PTAR called  

## 2019-11-11 NOTE — ED Notes (Signed)
An After Visit Summary was printed and given to the patient. Discharge instructions given and no further questions at this time.  

## 2019-11-11 NOTE — Discharge Instructions (Signed)
As discussed, your evaluation today has been largely reassuring.  But, it is important that you monitor your condition carefully, and do not hesitate to return to the ED if you develop new, or concerning changes in your condition.  Otherwise, please follow-up with your physician for appropriate ongoing care.  Please be sure to discuss today's evaluation, your diagnosis of shakiness and urinary tract infection.

## 2019-11-17 ENCOUNTER — Other Ambulatory Visit: Payer: Self-pay

## 2019-11-17 ENCOUNTER — Emergency Department (HOSPITAL_COMMUNITY): Payer: Medicare Other

## 2019-11-17 ENCOUNTER — Encounter (HOSPITAL_COMMUNITY): Payer: Self-pay | Admitting: Emergency Medicine

## 2019-11-17 ENCOUNTER — Inpatient Hospital Stay (HOSPITAL_COMMUNITY)
Admission: EM | Admit: 2019-11-17 | Discharge: 2019-11-27 | DRG: 092 | Disposition: A | Discharge status: Home Health | Payer: Medicare Other | Attending: Family Medicine | Admitting: Family Medicine

## 2019-11-17 DIAGNOSIS — T56891A Toxic effect of other metals, accidental (unintentional), initial encounter: Secondary | ICD-10-CM

## 2019-11-17 DIAGNOSIS — E876 Hypokalemia: Secondary | ICD-10-CM | POA: Diagnosis not present

## 2019-11-17 DIAGNOSIS — N289 Disorder of kidney and ureter, unspecified: Secondary | ICD-10-CM | POA: Diagnosis present

## 2019-11-17 DIAGNOSIS — Z818 Family history of other mental and behavioral disorders: Secondary | ICD-10-CM | POA: Diagnosis not present

## 2019-11-17 DIAGNOSIS — T43595A Adverse effect of other antipsychotics and neuroleptics, initial encounter: Secondary | ICD-10-CM | POA: Diagnosis present

## 2019-11-17 DIAGNOSIS — F419 Anxiety disorder, unspecified: Secondary | ICD-10-CM | POA: Diagnosis present

## 2019-11-17 DIAGNOSIS — R1111 Vomiting without nausea: Secondary | ICD-10-CM | POA: Diagnosis not present

## 2019-11-17 DIAGNOSIS — E860 Dehydration: Secondary | ICD-10-CM | POA: Diagnosis present

## 2019-11-17 DIAGNOSIS — E870 Hyperosmolality and hypernatremia: Secondary | ICD-10-CM | POA: Diagnosis not present

## 2019-11-17 DIAGNOSIS — Z833 Family history of diabetes mellitus: Secondary | ICD-10-CM

## 2019-11-17 DIAGNOSIS — R45851 Suicidal ideations: Secondary | ICD-10-CM | POA: Diagnosis present

## 2019-11-17 DIAGNOSIS — Z87440 Personal history of urinary (tract) infections: Secondary | ICD-10-CM

## 2019-11-17 DIAGNOSIS — G249 Dystonia, unspecified: Secondary | ICD-10-CM | POA: Diagnosis present

## 2019-11-17 DIAGNOSIS — Z8261 Family history of arthritis: Secondary | ICD-10-CM | POA: Diagnosis not present

## 2019-11-17 DIAGNOSIS — Z20828 Contact with and (suspected) exposure to other viral communicable diseases: Secondary | ICD-10-CM | POA: Diagnosis present

## 2019-11-17 DIAGNOSIS — Z79899 Other long term (current) drug therapy: Secondary | ICD-10-CM

## 2019-11-17 DIAGNOSIS — Z803 Family history of malignant neoplasm of breast: Secondary | ICD-10-CM

## 2019-11-17 DIAGNOSIS — Z811 Family history of alcohol abuse and dependence: Secondary | ICD-10-CM | POA: Diagnosis not present

## 2019-11-17 DIAGNOSIS — Z8349 Family history of other endocrine, nutritional and metabolic diseases: Secondary | ICD-10-CM | POA: Diagnosis not present

## 2019-11-17 DIAGNOSIS — E871 Hypo-osmolality and hyponatremia: Secondary | ICD-10-CM | POA: Diagnosis present

## 2019-11-17 DIAGNOSIS — E039 Hypothyroidism, unspecified: Secondary | ICD-10-CM | POA: Diagnosis present

## 2019-11-17 DIAGNOSIS — Z915 Personal history of self-harm: Secondary | ICD-10-CM | POA: Diagnosis not present

## 2019-11-17 DIAGNOSIS — G92 Toxic encephalopathy: Principal | ICD-10-CM | POA: Diagnosis present

## 2019-11-17 DIAGNOSIS — F209 Schizophrenia, unspecified: Secondary | ICD-10-CM | POA: Diagnosis present

## 2019-11-17 DIAGNOSIS — G9341 Metabolic encephalopathy: Secondary | ICD-10-CM | POA: Diagnosis not present

## 2019-11-17 DIAGNOSIS — F319 Bipolar disorder, unspecified: Secondary | ICD-10-CM | POA: Diagnosis present

## 2019-11-17 DIAGNOSIS — N390 Urinary tract infection, site not specified: Secondary | ICD-10-CM | POA: Diagnosis present

## 2019-11-17 DIAGNOSIS — R4182 Altered mental status, unspecified: Secondary | ICD-10-CM | POA: Diagnosis present

## 2019-11-17 DIAGNOSIS — I10 Essential (primary) hypertension: Secondary | ICD-10-CM | POA: Diagnosis not present

## 2019-11-17 LAB — URINALYSIS, COMPLETE (UACMP) WITH MICROSCOPIC
Bilirubin Urine: NEGATIVE
Glucose, UA: NEGATIVE mg/dL
Hgb urine dipstick: NEGATIVE
Ketones, ur: NEGATIVE mg/dL
Nitrite: NEGATIVE
Protein, ur: NEGATIVE mg/dL
Specific Gravity, Urine: 1.005 (ref 1.005–1.030)
pH: 7 (ref 5.0–8.0)

## 2019-11-17 LAB — CBC WITH DIFFERENTIAL/PLATELET
Abs Immature Granulocytes: 0.02 10*3/uL (ref 0.00–0.07)
Basophils Absolute: 0.1 10*3/uL (ref 0.0–0.1)
Basophils Relative: 1 %
Eosinophils Absolute: 0.3 10*3/uL (ref 0.0–0.5)
Eosinophils Relative: 3 %
HCT: 45.4 % (ref 36.0–46.0)
Hemoglobin: 14.4 g/dL (ref 12.0–15.0)
Immature Granulocytes: 0 %
Lymphocytes Relative: 15 %
Lymphs Abs: 1.5 10*3/uL (ref 0.7–4.0)
MCH: 28.5 pg (ref 26.0–34.0)
MCHC: 31.7 g/dL (ref 30.0–36.0)
MCV: 89.9 fL (ref 80.0–100.0)
Monocytes Absolute: 0.7 10*3/uL (ref 0.1–1.0)
Monocytes Relative: 7 %
Neutro Abs: 7.4 10*3/uL (ref 1.7–7.7)
Neutrophils Relative %: 74 %
Platelets: 271 10*3/uL (ref 150–400)
RBC: 5.05 MIL/uL (ref 3.87–5.11)
RDW: 13.9 % (ref 11.5–15.5)
WBC: 9.9 10*3/uL (ref 4.0–10.5)
nRBC: 0 % (ref 0.0–0.2)

## 2019-11-17 LAB — COMPREHENSIVE METABOLIC PANEL
ALT: 22 U/L (ref 0–44)
AST: 17 U/L (ref 15–41)
Albumin: 4 g/dL (ref 3.5–5.0)
Alkaline Phosphatase: 117 U/L (ref 38–126)
Anion gap: 9 (ref 5–15)
BUN: 5 mg/dL — ABNORMAL LOW (ref 6–20)
CO2: 26 mmol/L (ref 22–32)
Calcium: 11 mg/dL — ABNORMAL HIGH (ref 8.9–10.3)
Chloride: 111 mmol/L (ref 98–111)
Creatinine, Ser: 1.19 mg/dL — ABNORMAL HIGH (ref 0.44–1.00)
GFR calc Af Amer: 60 mL/min — ABNORMAL LOW (ref >60)
GFR calc non Af Amer: 51 mL/min — ABNORMAL LOW (ref >60)
Glucose, Bld: 113 mg/dL — ABNORMAL HIGH (ref 70–99)
Potassium: 3.9 mmol/L (ref 3.5–5.1)
Sodium: 146 mmol/L — ABNORMAL HIGH (ref 135–145)
Total Bilirubin: 0.4 mg/dL (ref 0.3–1.2)
Total Protein: 7 g/dL (ref 6.5–8.1)

## 2019-11-17 LAB — ETHANOL: Alcohol, Ethyl (B): <10 mg/dL (ref <10)

## 2019-11-17 LAB — SARS CORONAVIRUS 2 (TAT 6-24 HRS): SARS Coronavirus 2: NEGATIVE

## 2019-11-17 LAB — LITHIUM LEVEL
Lithium Lvl: 1.39 mmol/L — ABNORMAL HIGH (ref 0.60–1.20)
Lithium Lvl: 1.13 mmol/L (ref 0.60–1.20)

## 2019-11-17 LAB — RAPID URINE DRUG SCREEN, HOSP PERFORMED
Amphetamines: NOT DETECTED
Barbiturates: NOT DETECTED
Benzodiazepines: POSITIVE — AB
Cocaine: NOT DETECTED
Opiates: NOT DETECTED
Tetrahydrocannabinol: NOT DETECTED

## 2019-11-17 LAB — CBG MONITORING, ED: Glucose-Capillary: 100 mg/dL — ABNORMAL HIGH (ref 70–99)

## 2019-11-17 LAB — AMMONIA: Ammonia: 19 umol/L (ref 9–35)

## 2019-11-17 MED ORDER — LAMOTRIGINE 25 MG PO TABS
50.0000 mg | ORAL_TABLET | Freq: Two times a day (BID) | ORAL | Status: DC
  Administered 2019-11-18 – 2019-11-27 (×18): 50 mg via ORAL
  Filled 2019-11-17 (×23): qty 2

## 2019-11-17 MED ORDER — HALOPERIDOL 5 MG PO TABS
20.0000 mg | ORAL_TABLET | Freq: Every day | ORAL | Status: DC
  Administered 2019-11-18 – 2019-11-26 (×8): 20 mg via ORAL
  Filled 2019-11-17 (×12): qty 4

## 2019-11-17 MED ORDER — BENZTROPINE MESYLATE 2 MG PO TABS
2.0000 mg | ORAL_TABLET | Freq: Two times a day (BID) | ORAL | Status: DC
  Administered 2019-11-18 – 2019-11-27 (×18): 2 mg via ORAL
  Filled 2019-11-17 (×22): qty 1

## 2019-11-17 MED ORDER — ACETAMINOPHEN 650 MG RE SUPP: 650.0000 mg | Freq: Four times a day (QID) | RECTAL | Status: DC | PRN

## 2019-11-17 MED ORDER — ACETAMINOPHEN 325 MG PO TABS: 650.0000 mg | ORAL_TABLET | Freq: Four times a day (QID) | ORAL | Status: DC | PRN

## 2019-11-17 MED ORDER — HEPARIN SODIUM (PORCINE) 5000 UNIT/ML IJ SOLN
5000.0000 [IU] | Freq: Three times a day (TID) | INTRAMUSCULAR | Status: DC
  Administered 2019-11-17 – 2019-11-27 (×30): 5000 [IU] via SUBCUTANEOUS
  Filled 2019-11-17 (×26): qty 1

## 2019-11-17 MED ORDER — SODIUM CHLORIDE 0.9 % IV SOLN: INTRAVENOUS | Status: DC

## 2019-11-17 MED ORDER — CLONIDINE HCL 0.1 MG PO TABS: 0.1000 mg | ORAL_TABLET | Freq: Two times a day (BID) | ORAL | Status: DC

## 2019-11-17 MED ORDER — TEMAZEPAM 15 MG PO CAPS
30.0000 mg | ORAL_CAPSULE | Freq: Every day | ORAL | Status: DC
  Administered 2019-11-18 – 2019-11-26 (×7): 30 mg via ORAL
  Filled 2019-11-17 (×10): qty 2

## 2019-11-17 MED ORDER — CARVEDILOL 25 MG PO TABS
25.0000 mg | ORAL_TABLET | Freq: Two times a day (BID) | ORAL | Status: DC
  Administered 2019-11-17 – 2019-11-27 (×20): 25 mg via ORAL
  Filled 2019-11-17 (×11): qty 1
  Filled 2019-11-17: qty 2
  Filled 2019-11-17 (×8): qty 1

## 2019-11-17 MED ORDER — SODIUM CHLORIDE 0.9 % IV BOLUS
1000.0000 mL | Freq: Once | INTRAVENOUS | Status: AC
  Administered 2019-11-17: 1000 mL via INTRAVENOUS

## 2019-11-17 NOTE — ED Provider Notes (Signed)
  Face-to-face evaluation   History: Patient complains of tremor, which seem to be worse.  She is moderately confused.  Physical exam: Alert, cooperative, responsive.  Mild tremor, bilateral hands.  Mild dysarthria.  No aphasia.  No respiratory distress.  Abdomen soft and nontender.  4:10 PM-discussed with nephrology regarding concern for chronic lithium toxicity causing mental status changes.  He will see the patient in the ED for consideration of treatment including dialysis.  4:11 PM-call placed to hospitalist service to admit patient for monitoring.  Medical screening examination/treatment/procedure(s) were conducted as a shared visit with non-physician practitioner(s) and myself.  I personally evaluated the patient during the encounter    Daleen Bo, MD 11/18/19 2208

## 2019-11-17 NOTE — Consult Note (Addendum)
[facility]phrology Consultation Note  Requesting MD: Dr. Daleen Bo (ER) Reason for consult: possible lithium toxicity  HPI:  Paula Dickson is a 53 y.o. female with history of hypertension, schizophrenia, bipolar affective disorder, anxiety depression, on chronic lithium treatment, presented to the ER for generalized weakness associated with tremors and change in mental status, seen as a consultation at the request of Dr. Eulis Foster for possible lithium toxicity.  Patient was recently treated with antibiotics for UTI.  Unknown the name of antibiotics.  Today the EMS was called because of tremulousness, change in mental status and abnormal speech.  She also has a history of hyperammonemia and metabolic encephalopathy.  Since she was on lithium there was concern about lithium yesterday.  In the ER, vitals are acceptable, in room air, BP mildly elevated.  The labs showed sodium 146, potassium 3.9, creatinine level 1.19, CO2 26 and lithium level 1.39.  Urinalysis with no protein or RBC, possibly may have UTI.  She was treated with a liter of normal saline in the ER.  During evaluation, patient was alert awake and oriented to December 2020 name and place.  She was intermittently somewhat confused.  She takes a lot of CNS depressant at home.  She denied taking any extra dose of lithium.  Denies nausea, vomiting, chest pain, shortness of breath.  No urinary symptoms.   PMHx:   Past Medical History:  Diagnosis Date  . Bipolar affective disorder ([place])   . History of arthritis   . History of chicken pox   . History of depression   . History of genital warts   . history of heart murmur   . History of high blood pressure   . History of thyroid disease   . History of UTI   . Hypertension   . Low TSH level 07/13/2017  . Schizophrenia [facility])     Past Surgical History:  Procedure Laterality Date  . ABLATION ON ENDOMETRIOSIS    . CYST REMOVAL NECK     around 11 years ago  /benign  . MULTIPLE TOOTH EXTRACTIONS      Family Hx:  Family History  Problem Relation Age of Onset  . Arthritis Father   . Hyperlipidemia Father   . High blood pressure Father   . Diabetes Sister   . Diabetes Mother   . Diabetes Brother   . Mental illness Brother   . Alcohol abuse Paternal Uncle   . Alcohol abuse Paternal Grandfather   . Breast cancer Maternal Aunt   . Breast cancer Paternal Aunt   . High blood pressure Sister   . Mental illness Other        runs in family    Social History:  reports that she has never smoked. She has never used smokeless tobacco. She reports that she does not drink alcohol or use drugs.  Allergies: No Known Allergies  Medications: Prior to Admission medications   Medication Sig Start Date End Date Taking? Authorizing Provider  benztropine (COGENTIN) 2 MG tablet Take 2 mg by mouth 2 (two) times daily. 11/04/19   [provider]  carvedilol (COREG) 25 MG tablet Take 1 tablet (25 mg total) by mouth 2 (two) times daily with a meal. 04/01/19   Johnn Hai, MD  cloNIDine (CATAPRES) 0.1 MG tablet Take 1 tablet (0.1 mg total) by mouth 2 (two) times daily. Patient not taking: Reported on 11/11/2019 04/01/19   Johnn Hai, MD  haloperidol (HALDOL) 10 MG tablet Take 2 tablets (  20 mg total) by mouth at bedtime. 04/01/19   Johnn Hai, MD  lamoTRIgine (LAMICTAL) 25 MG tablet Take 50 mg by mouth 2 (two) times daily. 11/03/19   [provider]  lithium 600 MG capsule Take 600 mg by mouth at bedtime. 11/03/19   [provider]  lithium carbonate 300 MG capsule 1 in am 2 at hs Patient taking differently: Take 300 mg by mouth every morning.  04/01/19   Johnn Hai, MD  temazepam (RESTORIL) 30 MG capsule Take 1 capsule (30 mg total) by mouth at bedtime. 04/01/19   Johnn Hai, MD    I have reviewed the patient's current medications.  Labs:  Results for orders placed or performed during the hospital encounter of 11/17/19 (from the past  48 hour(s))  Lithium level     Status: Abnormal   Collection Time: 11/17/19 12:18 PM  Result Value Ref Range   Lithium Lvl 1.39 (H) 0.60 - 1.20 mmol/L    Comment: Performed at [facility], [address] [place], [place] [address]  Comprehensive metabolic panel     Status: Abnormal   Collection Time: 11/17/19 12:45 PM  Result Value Ref Range   Sodium 146 (H) 135 - 145 mmol/L   Potassium 3.9 3.5 - 5.1 mmol/L   Chloride 111 98 - 111 mmol/L   CO2 26 22 - 32 mmol/L   Glucose, Bld 113 (H) 70 - 99 mg/dL   BUN 5 (L) 6 - 20 mg/dL   Creatinine, Ser 1.19 (H) 0.44 - 1.00 mg/dL   Calcium 11.0 (H) 8.9 - 10.3 mg/dL   Total Protein 7.0 6.5 - 8.1 g/dL   Albumin 4.0 3.5 - 5.0 g/dL   AST 17 15 - 41 U/L   ALT 22 0 - 44 U/L   Alkaline Phosphatase 117 38 - 126 U/L   Total Bilirubin 0.4 0.3 - 1.2 mg/dL   GFR calc non Af Amer 51 (L) >60 mL/min   GFR calc Af Amer 60 (L) >60 mL/min   Anion gap 9 5 - 15    Comment: Performed at [facility] [address], [place], [place] [address]  Ammonia     Status: None   Collection Time: 11/17/19 12:45 PM  Result Value Ref Range   Ammonia 19 9 - 35 umol/L    Comment: Performed at [facility], [place] [address], [place], [place] [address]  Ethanol     Status: None   Collection Time: 11/17/19 12:45 PM  Result Value Ref Range   Alcohol, Ethyl (B) <10 <10 mg/dL    Comment: (NOTE) Lowest detectable limit for serum alcohol is 10 mg/dL. For medical purposes only. Performed at [facility], [place] [address], [place], [place] [address]   CBC with Differential     Status: None   Collection Time: 11/17/19 12:49 PM  Result Value Ref Range   WBC 9.9 4.0 - 10.5 K/uL   RBC 5.05 3.87 - 5.11 MIL/uL   Hemoglobin 14.4 12.0 - 15.0 g/dL   HCT 45.4 36.0 - 46.0 %   MCV 89.9 80.0 - 100.0 fL   MCH 28.5 26.0 - 34.0 pg   MCHC 31.7 30.0 - 36.0 g/dL   RDW 13.9 11.5 - 15.5 %   Platelets 271 150 - 400 K/uL   nRBC 0.0 0.0 - 0.2 %   Neutrophils Relative % 74  %   Neutro Abs 7.4 1.7 - 7.7 K/uL   Lymphocytes Relative  15 %   Lymphs Abs 1.5 0.7 - 4.0 K/uL   Monocytes Relative 7 %   Monocytes Absolute 0.7 0.1 - 1.0 K/uL   Eosinophils Relative 3 %   Eosinophils Absolute 0.3 0.0 - 0.5 K/uL   Basophils Relative 1 %   Basophils Absolute 0.1 0.0 - 0.1 K/uL   Immature Granulocytes 0 %   Abs Immature Granulocytes 0.02 0.00 - 0.07 K/uL    Comment: Performed at [facility], [place] [name] [place], [place] [address]  Urinalysis, Complete w Microscopic     Status: Abnormal   Collection Time: 11/17/19  3:41 PM  Result Value Ref Range   Color, Urine YELLOW YELLOW   APPearance CLEAR CLEAR   Specific Gravity, Urine 1.005 1.005 - 1.030   pH 7.0 5.0 - 8.0   Glucose, UA NEGATIVE NEGATIVE mg/dL   Hgb urine dipstick NEGATIVE NEGATIVE   Bilirubin Urine NEGATIVE NEGATIVE   Ketones, ur NEGATIVE NEGATIVE mg/dL   Protein, ur NEGATIVE NEGATIVE mg/dL   Nitrite NEGATIVE NEGATIVE   Leukocytes,Ua LARGE (A) NEGATIVE   RBC / HPF 0-5 0 - 5 RBC/hpf   WBC, UA 6-10 0 - 5 WBC/hpf   Bacteria, UA RARE (A) NONE SEEN   Squamous Epithelial / LPF 0-5 0 - 5    Comment: Performed at [facility], [place] [address] [place], [place] [address]     ROS:  Pertinent items noted in HPI and remainder of comprehensive ROS otherwise negative.  Physical Exam: Vitals:   11/17/19 1530 11/17/19 1545  BP: (!) 174/99 (!) 169/89  Pulse: 71 78  Resp: 18 (!) 22  Temp:    SpO2: 100% 95%     General exam: Appears calm and comfortable, lying in bed comfortable, looks dry on exam. Respiratory system: Clear to auscultation. Respiratory effort normal. No wheezing or crackle Cardiovascular system: S1 & S2 heard, RRR.  No pedal edema. Gastrointestinal system: Abdomen is nondistended, soft and nontender. Normal bowel sounds heard. Central nervous system: Alert and oriented. No focal neurological deficits. Extremities: Symmetric 5 x 5 power. Skin: No rashes, lesions or  ulcers Psychiatry: Orientated but confused to answer some question.  Assessment/Plan:  #Altered mental status, tremor: Suspected lithium toxicity in ER. The lithium level is close to therapeutic level and she has good GFR.  Patient denied taking extra dose of lithium.  Her mental status is somewhat slow to respond however oriented x3.  She has no GI symptoms.  She looks dehydrated.  Ammonia and ethanol level not elevated.  No indication for dialysis at this time. I will order lithium level every 4 hours to make sure that the level does not trend up. Continue hydration with normal saline, start 150 cc an hour. Hold sedatives.  #Possible UTI: Check urine culture and antibiotics per primary team.  Reportedly she was recently diagnosed with UTI.  Unknown how long she took antibiotics.  #Hypertension: Blood pressure is elevated.  Clonidine is listed as home medication.  Monitor blood pressure.  #Mild hypernatremia: Likely dehydration.  IV fluids as above.  Discussed with the ER physician.  Thank you for the consult.  Will follow.   Mauri Temkin Tanna Furry 11/17/2019, 4:35 PM  [facility].

## 2019-11-17 NOTE — ED Notes (Signed)
Called Main Lab to ensure they had Urine Culture in process; Lab is currently looking for specimen sent down.

## 2019-11-17 NOTE — ED Notes (Signed)
Pt reeducated on purewick catheter; instructed to let staff know when she urinates to collect urine sample. Also instructed to call out for assistance if needing help to stand or sit up.

## 2019-11-17 NOTE — ED Notes (Signed)
ED TO INPATIENT HANDOFF REPORT  ED Nurse Name and Phone #: Lunette Stands [place] Name/Age/Gender Paula Dickson 53 y.o. female Room/Bed: 038C/038C  Code Status   Code Status: Full Code  Home/SNF/Other Home Patient oriented to: self, place and time Is this baseline? Yes   Triage Complete: Triage complete  Chief Complaint AMS (altered mental status) [R41.82]  Triage Note Pt BIB [facility] from home. Per EMS pt diagnosed with UTI last week. Per pt family, symptoms have gotten worse. Per EMS pt normally has tremors but they have worsened since the start of the UTI. VSS. NAD.     Allergies No Known Allergies  Level of Care/Admitting Diagnosis ED Disposition    ED Disposition Condition [facility] Area: [place] [[id number]]  Level of Care: Telemetry Medical [104]  Covid Evaluation: Asymptomatic Screening Protocol (No Symptoms)  Diagnosis: AMS (altered mental status) [id number]  Admitting Physician: Lequita Halt [id number]  Attending Physician: Lequita Halt [id number]  Estimated length of stay: 3 - 4 days  Certification:: I certify this patient will need inpatient services for at least 2 midnights       B Medical/Surgery History Past Medical History:  Diagnosis Date  . Bipolar affective disorder ([place])   . History of arthritis   . History of chicken pox   . History of depression   . History of genital warts   . history of heart murmur   . History of high blood pressure   . History of thyroid disease   . History of UTI   . Hypertension   . Low TSH level 07/13/2017  . Schizophrenia [facility])    Past Surgical History:  Procedure Laterality Date  . ABLATION ON ENDOMETRIOSIS    . CYST REMOVAL NECK     around 11 years ago /benign  . MULTIPLE TOOTH EXTRACTIONS       A IV Location/Drains/Wounds Patient Lines/Drains/Airways Status   Active Line/Drains/Airways    Name:   Placement date:   Placement time:   Site:   Days:   Peripheral  IV 11/17/19 Right Antecubital   11/17/19    1236    Antecubital   less than 1   External Urinary Catheter   10/15/19    2311    --   33          Intake/Output Last 24 hours No intake or output data in the 24 hours ending 11/17/19 2128  Labs/Imaging Results for orders placed or performed during the hospital encounter of 11/17/19 (from the past 48 hour(s))  Lithium level     Status: Abnormal   Collection Time: 11/17/19 12:18 PM  Result Value Ref Range   Lithium Lvl 1.39 (H) 0.60 - 1.20 mmol/L    Comment: Performed at [facility], [place] [address], [place], [place] [address]  Comprehensive metabolic panel     Status: Abnormal   Collection Time: 11/17/19 12:45 PM  Result Value Ref Range   Sodium 146 (H) 135 - 145 mmol/L   Potassium 3.9 3.5 - 5.1 mmol/L   Chloride 111 98 - 111 mmol/L   CO2 26 22 - 32 mmol/L   Glucose, Bld 113 (H) 70 - 99 mg/dL   BUN 5 (L) 6 - 20 mg/dL   Creatinine, Ser 1.19 (H) 0.44 - 1.00 mg/dL   Calcium 11.0 (H) 8.9 - 10.3 mg/dL   Total Protein 7.0 6.5 - 8.1 g/dL   Albumin 4.0 3.5 - 5.0  g/dL   AST 17 15 - 41 U/L   ALT 22 0 - 44 U/L   Alkaline Phosphatase 117 38 - 126 U/L   Total Bilirubin 0.4 0.3 - 1.2 mg/dL   GFR calc non Af Amer 51 (L) >60 mL/min   GFR calc Af Amer 60 (L) >60 mL/min   Anion gap 9 5 - 15    Comment: Performed at [facility] [address], [place], [place] [address]  Ammonia     Status: None   Collection Time: 11/17/19 12:45 PM  Result Value Ref Range   Ammonia 19 9 - 35 umol/L    Comment: Performed at [facility], [place] [address], [place], [place] [address]  Ethanol     Status: None   Collection Time: 11/17/19 12:45 PM  Result Value Ref Range   Alcohol, Ethyl (B) <10 <10 mg/dL    Comment: (NOTE) Lowest detectable limit for serum alcohol is 10 mg/dL. For medical purposes only. Performed at [facility], [place] [address] [place], [place] [address]   CBC with Differential     Status: None   Collection Time:  11/17/19 12:49 PM  Result Value Ref Range   WBC 9.9 4.0 - 10.5 K/uL   RBC 5.05 3.87 - 5.11 MIL/uL   Hemoglobin 14.4 12.0 - 15.0 g/dL   HCT 45.4 36.0 - 46.0 %   MCV 89.9 80.0 - 100.0 fL   MCH 28.5 26.0 - 34.0 pg   MCHC 31.7 30.0 - 36.0 g/dL   RDW 13.9 11.5 - 15.5 %   Platelets 271 150 - 400 K/uL   nRBC 0.0 0.0 - 0.2 %   Neutrophils Relative % 74 %   Neutro Abs 7.4 1.7 - 7.7 K/uL   Lymphocytes Relative 15 %   Lymphs Abs 1.5 0.7 - 4.0 K/uL   Monocytes Relative 7 %   Monocytes Absolute 0.7 0.1 - 1.0 K/uL   Eosinophils Relative 3 %   Eosinophils Absolute 0.3 0.0 - 0.5 K/uL   Basophils Relative 1 %   Basophils Absolute 0.1 0.0 - 0.1 K/uL   Immature Granulocytes 0 %   Abs Immature Granulocytes 0.02 0.00 - 0.07 K/uL    Comment: Performed at [facility], [address] [place], [place] [address]  SARS CORONAVIRUS 2 (TAT 6-24 HRS) Nasopharyngeal Nasopharyngeal Swab     Status: None   Collection Time: 11/17/19  3:26 PM   Specimen: Nasopharyngeal Swab  Result Value Ref Range   SARS Coronavirus 2 NEGATIVE NEGATIVE    Comment: (NOTE) SARS-CoV-2 target nucleic acids are NOT DETECTED. The SARS-CoV-2 RNA is generally detectable in upper and lower respiratory specimens during the acute phase of infection. Negative results do not preclude SARS-CoV-2 infection, do not rule out co-infections with other pathogens, and should not be used as the sole basis for treatment or other patient management decisions. Negative results must be combined with clinical observations, patient history, and epidemiological information. The expected result is Negative. Fact Sheet for Patients: SugarRoll.be Fact Sheet for Healthcare Providers: https://www.woods-mathews.com/ This test is not yet approved or cleared by the [place] FDA and  has been authorized for detection and/or diagnosis of SARS-CoV-2 by FDA under an Emergency Use Authorization (EUA). This EUA  will remain  in effect (meaning this test can be used) for the duration of the COVID-19 declaration under Section 56 4(b)(1) of the Act, 21 U.S.C. section 360bbb-3(b)(1), unless the authorization is terminated or revoked  sooner. Performed at [facility], [place] [address] [place], [place] [address]   Urinalysis, Complete w Microscopic     Status: Abnormal   Collection Time: 11/17/19  3:41 PM  Result Value Ref Range   Color, Urine YELLOW YELLOW   APPearance CLEAR CLEAR   Specific Gravity, Urine 1.005 1.005 - 1.030   pH 7.0 5.0 - 8.0   Glucose, UA NEGATIVE NEGATIVE mg/dL   Hgb urine dipstick NEGATIVE NEGATIVE   Bilirubin Urine NEGATIVE NEGATIVE   Ketones, ur NEGATIVE NEGATIVE mg/dL   Protein, ur NEGATIVE NEGATIVE mg/dL   Nitrite NEGATIVE NEGATIVE   Leukocytes,Ua LARGE (A) NEGATIVE   RBC / HPF 0-5 0 - 5 RBC/hpf   WBC, UA 6-10 0 - 5 WBC/hpf   Bacteria, UA RARE (A) NONE SEEN   Squamous Epithelial / LPF 0-5 0 - 5    Comment: Performed at [facility], [place] [address], [place], [place] [address]  Urine rapid drug screen (hosp performed)     Status: Abnormal   Collection Time: 11/17/19  3:41 PM  Result Value Ref Range   Opiates NONE DETECTED NONE DETECTED   Cocaine NONE DETECTED NONE DETECTED   Benzodiazepines POSITIVE (A) NONE DETECTED   Amphetamines NONE DETECTED NONE DETECTED   Tetrahydrocannabinol NONE DETECTED NONE DETECTED   Barbiturates NONE DETECTED NONE DETECTED    Comment: (NOTE) DRUG SCREEN FOR MEDICAL PURPOSES ONLY.  IF CONFIRMATION IS NEEDED FOR ANY PURPOSE, NOTIFY LAB WITHIN 5 DAYS. LOWEST DETECTABLE LIMITS FOR URINE DRUG SCREEN Drug Class                     Cutoff (ng/mL) Amphetamine and metabolites    1000 Barbiturate and metabolites    200 Benzodiazepine                 [id number] Tricyclics and metabolites     300 Opiates and metabolites        300 Cocaine and metabolites        300 THC                            50 Performed at [facility], [place] [address] [place], [place] [address]   Lithium level     Status: None   Collection Time: 11/17/19  8:00 PM  Result Value Ref Range   Lithium Lvl 1.13 0.60 - 1.20 mmol/L    Comment: Performed at [facility] [address] [place], [place] [address]  CBG monitoring, ED     Status: Abnormal   Collection Time: 11/17/19  9:13 PM  Result Value Ref Range   Glucose-Capillary 100 (H) 70 - 99 mg/dL   CT Head Wo Contrast  Result Date: 11/17/2019 CLINICAL DATA:  Altered mental status. EXAM: CT HEAD WITHOUT CONTRAST TECHNIQUE: Contiguous axial images were obtained from the base of the skull through the vertex without intravenous contrast. COMPARISON:  11/15/2018. Brain MR dated 10/13/2019. FINDINGS: Brain: Normal appearing cerebral hemispheres and posterior fossa structures. Normal size and position of the ventricles. No intracranial hemorrhage, mass lesion or CT evidence of acute infarction. Vascular: No hyperdense vessel or unexpected calcification. Skull: Normal. Negative for fracture or focal lesion. Sinuses/Orbits: Left ethmoid and sphenoid sinus mucosal thickening and mild mucosal thickening/retained secretions in the right maxillary sinus. Unremarkable orbits. Other: None. IMPRESSION: 1. No intracranial abnormality. 2. 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 Electronically Signed   By: Claudie Revering M.D.   On: 11/17/2019 14:53    Pending Labs Unresulted Labs (From admission, onward)    Start     Ordered   11/18/19 0500  Renal function panel  Tomorrow morning,   R     11/17/19 1651   11/18/19 0500  TSH  Tomorrow morning,   R     11/17/19 1749   11/17/19 2000  Lithium level  Now then every 4 hours,   R (with STAT occurrences)     11/17/19 1635   11/17/19 1623  Urine culture  ONCE - STAT,   STAT     11/17/19 1623          Vitals/Pain Today's Vitals   11/17/19 1659 11/17/19 1855 11/17/19 1930 11/17/19 2015  BP:   (!)  119/106 (!) 158/78  Pulse:  76 77 68  Resp:   19 (!) 23  Temp:      TempSrc:      SpO2:  100% 99% 96%  PainSc: 0-No pain       Isolation Precautions No active isolations  Medications Medications  0.9 %  sodium chloride infusion ( Intravenous New Bag/Given 11/17/19 1848)  carvedilol (COREG) tablet 25 mg (25 mg Oral Given 11/17/19 1848)  haloperidol (HALDOL) tablet 20 mg (has no administration in time range)  temazepam (RESTORIL) capsule 30 mg (has no administration in time range)  benztropine (COGENTIN) tablet 2 mg (has no administration in time range)  lamoTRIgine (LAMICTAL) tablet 50 mg (has no administration in time range)  heparin injection 5,000 Units (5,000 Units Subcutaneous Given 11/17/19 1848)  acetaminophen (TYLENOL) tablet 650 mg (has no administration in time range)    Or  acetaminophen (TYLENOL) suppository 650 mg (has no administration in time range)  sodium chloride 0.9 % bolus 1,000 mL (0 mLs Intravenous Stopped 11/17/19 1848)    Mobility walks with device Low fall risk   Focused Assessments Renal Assessment Handoff:    R Recommendations: See Admitting Provider Note  Report given to:   Additional Notes: N/A

## 2019-11-17 NOTE — ED Notes (Signed)
Attempted to call report to [facility]M Nurse; nurse is currently in another pt room and will return my call; will call again in 15 minutes.

## 2019-11-17 NOTE — ED Notes (Signed)
Pt required re-orientation to situation.

## 2019-11-17 NOTE — H&P (Addendum)
History and Physical    Paula Dickson [id number] DOB: [birth year] DOA: 11/17/2019  PCP: Sandi Mariscal, MD (Confirm with patient/family/NH records and if not entered, this has to be entered at [facility] point of entry) Patient coming from: Home  I have personally briefly reviewed patient's old medical records in [place]  Chief Complaint: I feel fine and want to go home   HPI: Paula Dickson is a 53 y.o. female with medical history significant of hypertension, schizophrenia, bipolar affective disorder, anxiety depression, on chronic lithium treatment, presented to the ER for generalized weakness associated with tremors and confusion was sent by her sister for evaluation.  Patient was completed 1 course of 6-day Keflex for UTI yesterday.    Patient sister chorionitis for patient became confused and has new onset of tremors and abnormal speech.    Patient denied any pain, fever chills no cough no dysuria no diarrhea.  ED Course: In the ER, BP mildly elevated.  The labs showed lithium level 1.39.  Urinalysis 6-10 WBC.  She was treated with a liter of normal saline in the ER.  Review of Systems: As per HPI otherwise 10 point review of systems negative.    Past Medical History:  Diagnosis Date  . Bipolar affective disorder ([place])   . History of arthritis   . History of chicken pox   . History of depression   . History of genital warts   . history of heart murmur   . History of high blood pressure   . History of thyroid disease   . History of UTI   . Hypertension   . Low TSH level 07/13/2017  . Schizophrenia [facility])     Past Surgical History:  Procedure Laterality Date  . ABLATION ON ENDOMETRIOSIS    . CYST REMOVAL NECK     around 11 years ago /benign  . MULTIPLE TOOTH EXTRACTIONS       reports that she has never smoked. She has never used smokeless tobacco. She reports that she does not drink alcohol or use drugs.  No Known Allergies  Family History  Problem  Relation Age of Onset  . Arthritis Father   . Hyperlipidemia Father   . High blood pressure Father   . Diabetes Sister   . Diabetes Mother   . Diabetes Brother   . Mental illness Brother   . Alcohol abuse Paternal Uncle   . Alcohol abuse Paternal Grandfather   . Breast cancer Maternal Aunt   . Breast cancer Paternal Aunt   . High blood pressure Sister   . Mental illness Other        runs in family     Prior to Admission medications   Medication Sig Start Date End Date Taking? Authorizing Provider  benztropine (COGENTIN) 2 MG tablet Take 2 mg by mouth 2 (two) times daily. 11/04/19   [provider]  carvedilol (COREG) 25 MG tablet Take 1 tablet (25 mg total) by mouth 2 (two) times daily with a meal. 04/01/19   Johnn Hai, MD  cloNIDine (CATAPRES) 0.1 MG tablet Take 1 tablet (0.1 mg total) by mouth 2 (two) times daily. Patient not taking: Reported on 11/11/2019 04/01/19   Johnn Hai, MD  haloperidol (HALDOL) 10 MG tablet Take 2 tablets (20 mg total) by mouth at bedtime. 04/01/19   Johnn Hai, MD  lamoTRIgine (LAMICTAL) 25 MG tablet Take 50 mg by mouth 2 (two) times daily. 11/03/19   [provider]  lithium 600  MG capsule Take 600 mg by mouth at bedtime. 11/03/19   [provider]  lithium carbonate 300 MG capsule 1 in am 2 at hs Patient taking differently: Take 300 mg by mouth every morning.  04/01/19   Johnn Hai, MD  temazepam (RESTORIL) 30 MG capsule Take 1 capsule (30 mg total) by mouth at bedtime. 04/01/19   Johnn Hai, MD    Physical Exam: Vitals:   11/17/19 1430 11/17/19 1530 11/17/19 1545 11/17/19 1645  BP:  (!) 174/99 (!) 169/89 135/90  Pulse: 68 71 78   Resp: (!) 24 18 (!) 22 20  Temp:      TempSrc:      SpO2: 96% 100% 95%     Constitutional: NAD, calm, comfortable Vitals:   11/17/19 1430 11/17/19 1530 11/17/19 1545 11/17/19 1645  BP:  (!) 174/99 (!) 169/89 135/90  Pulse: 68 71 78   Resp: (!) 24 18 (!) 22 20  Temp:      TempSrc:        SpO2: 96% 100% 95%    Eyes: PERRL, lids and conjunctivae normal ENMT: Mucous membranes are moist. Posterior pharynx clear of any exudate or lesions.Normal dentition.  Neck: normal, supple, no masses, no thyromegaly Respiratory: clear to auscultation bilaterally, no wheezing, no crackles. Normal respiratory effort. No accessory muscle use.  Cardiovascular: Regular rate and rhythm, no murmurs / rubs / gallops. No extremity edema. 2+ pedal pulses. No carotid bruits.  Abdomen: no tenderness, no masses palpated. No hepatosplenomegaly. Bowel sounds positive.  Musculoskeletal: no clubbing / cyanosis. No joint deformity upper and lower extremities. Good ROM, no contractures. Normal muscle tone.  Skin: no rashes, lesions, ulcers. No induration Neurologic: CN 2-12 grossly intact. Sensation intact, DTR normal. Strength 5/5 in all 4.  Fine tremors noticed on bilateral upper extremities, normal muscle tone. Psychiatric: Normal judgment and insight. Alert and oriented x 3. Normal mood.    Labs on Admission: I have personally reviewed following labs and imaging studies  CBC: Recent Labs  Lab 11/11/19 1248 11/17/19 1249  WBC 8.1 9.9  NEUTROABS 5.8 7.4  HGB 13.2 14.4  HCT 41.5 45.4  MCV 89.2 89.9  PLT 234 [id number]   Basic Metabolic Panel: Recent Labs  Lab 11/11/19 1248 11/17/19 1245  NA 140 146*  K 3.7 3.9  CL 105 111  CO2 27 26  GLUCOSE 107* 113*  BUN 8 5*  CREATININE 1.25* 1.19*  CALCIUM 10.9* 11.0*   GFR: CrCl cannot be calculated (Unknown ideal weight.). Liver Function Tests: Recent Labs  Lab 11/11/19 1248 11/17/19 1245  AST 15 17  ALT 19 22  ALKPHOS 106 117  BILITOT 0.9 0.4  PROT 7.0 7.0  ALBUMIN 4.0 4.0   No results for input(s): LIPASE, AMYLASE in the last 168 hours. Recent Labs  Lab 11/17/19 1245  AMMONIA 19   Coagulation Profile: No results for input(s): INR, PROTIME in the last 168 hours. Cardiac Enzymes: No results for input(s): CKTOTAL, CKMB, CKMBINDEX,  TROPONINI in the last 168 hours. BNP (last 3 results) No results for input(s): PROBNP in the last 8760 hours. HbA1C: No results for input(s): HGBA1C in the last 72 hours. CBG: No results for input(s): GLUCAP in the last 168 hours. Lipid Profile: No results for input(s): CHOL, HDL, LDLCALC, TRIG, CHOLHDL, LDLDIRECT in the last 72 hours. Thyroid Function Tests: No results for input(s): TSH, T4TOTAL, FREET4, T3FREE, THYROIDAB in the last 72 hours. Anemia Panel: No results for input(s): VITAMINB12, FOLATE, FERRITIN, TIBC, IRON, RETICCTPCT  in the last 72 hours. Urine analysis:    Component Value Date/Time   COLORURINE YELLOW 11/17/2019 [place] 11/17/2019 1541   LABSPEC 1.005 11/17/2019 1541   PHURINE 7.0 11/17/2019 1541   GLUCOSEU NEGATIVE 11/17/2019 1541   HGBUR NEGATIVE 11/17/2019 1541   BILIRUBINUR NEGATIVE 11/17/2019 1541   BILIRUBINUR neg 12/27/2015 1421   KETONESUR NEGATIVE 11/17/2019 1541   PROTEINUR NEGATIVE 11/17/2019 1541   UROBILINOGEN 0.2 12/27/2015 1421   UROBILINOGEN 0.2 06/14/2015 0040   NITRITE NEGATIVE 11/17/2019 1541   LEUKOCYTESUR LARGE (A) 11/17/2019 1541    Radiological Exams on Admission: CT Head Wo Contrast  Result Date: 11/17/2019 CLINICAL DATA:  Altered mental status. EXAM: CT HEAD WITHOUT CONTRAST TECHNIQUE: Contiguous axial images were obtained from the base of the skull through the vertex without intravenous contrast. COMPARISON:  11/15/2018. Brain MR dated 10/13/2019. FINDINGS: Brain: Normal appearing cerebral hemispheres and posterior fossa structures. Normal size and position of the ventricles. No intracranial hemorrhage, mass lesion or CT evidence of acute infarction. Vascular: No hyperdense vessel or unexpected calcification. Skull: Normal. Negative for fracture or focal lesion. Sinuses/Orbits: Left ethmoid and sphenoid sinus mucosal thickening and mild mucosal thickening/retained secretions in the right maxillary sinus. Unremarkable  orbits. Other: None. IMPRESSION: 1. No intracranial abnormality. 2. 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 Electronically Signed   By: Claudie Revering M.D.   On: 11/17/2019 14:53    EKG: Independently reviewed.  Nonspecific T wave changes as before, no PR or QTC level changes.  Assessment/Plan Active Problems:   Acute metabolic encephalopathy   AMS (altered mental status)  Acute metabolic encephalopathy, likely from lithium toxicity, with new onset tremors.  Placed a call and left a message to patient's Lucious Groves, waiting for reply.  Nephrology on board and decided to closely monitor lithium level, no plan for emergency dialysis for now.  Agreed with aggressive hydration.  Denied any depression/suicidal, or intentionally taking extra lithium, but she does look dry, and she is awake alert oriented x3, aware of her conditions.  Mild hyponatremia, likely from dehydration as above.  Tremors, like from lithium side effect, muscle tone normal, no rigidity, unlikely serotonin syndrome or extrapyramidal syndrome.  Hydration as above. Check TSH.  Recent UTI, treated 6 days of Keflex, no indication for further treatment.  Hypertension, continue home meds with parameters.  Bipolar disorder, Hold lithium.       DVT prophylaxis: Heparin subQ Code Status: Full Family Communication: Left message to sister [name] Disposition Plan: Home Consults called:Nephro Admission status: Tele admit   Lequita Halt MD [facility] Pager [phone]  If 7PM-7AM, please contact night-coverage [email or web address] Password [facility]  11/17/2019, 5:33 PM

## 2019-11-17 NOTE — ED Notes (Signed)
Pt requesting a Coke; Per Dr. Eulis Foster, pt may have PO fluids

## 2019-11-17 NOTE — ED Provider Notes (Signed)
[facility] EMERGENCY DEPARTMENT Provider Note   CSN: [id number] Arrival date & time: 11/17/19  1126     History Chief Complaint  Patient presents with  . Urinary Tract Infection    Paula Dickson is a 53 y.o. female who presents the emergency department chief complaint of urinary urgency and tremors.  Level 5 caveat due to altered mental status.  Patient has a history of schizophrenia, bipolar disorder, hypertension, hypothyroidism.  She states that she was treated for urinary tract infection after being seen in the emergency department on 11/11/2019.  Patient completed course of antibiotics (Keflex).  Review of EMR shows EMS reports family called today due to worsening symptoms.  Patient has had significant tremulousness which has been worsening over the past week.  Patient states that she does feel confused and that she feels like her speech is abnormal.  She is unsure who called EMS for her today.  She has a history of previous hyperammonemia, toxic metabolic encephalopathy.  Patient is on lithium.  Patient denies back pain, hematuria, foul odor of urine, nausea or vomiting.  Review of EMR shows no urine culture report.  HPI     Past Medical History:  Diagnosis Date  . Bipolar affective disorder ([place])   . History of arthritis   . History of chicken pox   . History of depression   . History of genital warts   . history of heart murmur   . History of high blood pressure   . History of thyroid disease   . History of UTI   . Hypertension   . Low TSH level 07/13/2017  . Schizophrenia [facility])     Patient Active Problem List   Diagnosis Date Noted  . Acute metabolic encephalopathy [id number]  . AMS (altered mental status) 11/17/2019  . Lithium toxicity 11/17/2019  . Hyperthyroidism 10/14/2019  . Medication side effect, initial encounter   . Schizoaffective disorder ([place]) 03/13/2019  . 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 Bipolar affective disorder, current episode mixed ([place]) 11/01/2018  . Toxic encephalopathy 07/14/2017  . Suicide attempt ([place]) 07/13/2017  . Low TSH level 07/13/2017  . Overdose of psychotropic 07/12/2017  . Valproic acid toxicity 07/12/2017  . Hyperammonemia ([place]) 07/12/2017  . Schizophrenia ([place]) 07/12/2017  . Bipolar disorder, most recent episode depressed ([place]) 07/12/2017  . Insomnia 12/27/2015  . Abnormal urinalysis 12/27/2015  . 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 Bipolar I disorder, most recent episode manic, severe with psychotic features ([place]) 06/14/2015    Past Surgical History:  Procedure Laterality Date  . ABLATION ON ENDOMETRIOSIS    . CYST REMOVAL NECK     around 11 years ago /benign  . MULTIPLE TOOTH EXTRACTIONS       OB History   No obstetric history on file.     Family History  Problem Relation Age of Onset  . Arthritis Father   . Hyperlipidemia Father   . High blood pressure Father   . Diabetes Sister   . Diabetes Mother   . Diabetes Brother   . Mental illness Brother   . Alcohol abuse Paternal Uncle   . Alcohol abuse Paternal Grandfather   . Breast cancer Maternal Aunt   . Breast cancer Paternal Aunt   . High blood pressure Sister   . Mental illness Other        runs in family    Social History   Tobacco Use  . Smoking status: Never  Smoker  . Smokeless tobacco: Never Used  Substance Use Topics  . Alcohol use: No  . Drug use: No    Home Medications Prior to Admission medications   Medication Sig Start Date End Date Taking? Authorizing Provider  benztropine (COGENTIN) 2 MG tablet Take 2 mg by mouth 2 (two) times daily. 11/04/19   [provider]  carvedilol (COREG) 25 MG tablet Take 1 tablet (25 mg total) by mouth 2 (two) times daily with a meal. 04/01/19   Johnn Hai, MD  cloNIDine (CATAPRES) 0.1 MG tablet Take 1 tablet (0.1 mg total) by mouth 2 (two) times daily. Patient not taking: Reported on 11/11/2019 04/01/19   Johnn Hai, MD  haloperidol (HALDOL) 10 MG tablet Take 2 tablets (20 mg total) by mouth at bedtime. 04/01/19   Johnn Hai, MD  lamoTRIgine (LAMICTAL) 25 MG tablet Take 50 mg by mouth 2 (two) times daily. 11/03/19   [provider]  lithium 600 MG capsule Take 600 mg by mouth at bedtime. 11/03/19   [provider]  lithium carbonate 300 MG capsule 1 in am 2 at hs Patient taking differently: Take 300 mg by mouth every morning.  04/01/19   Johnn Hai, MD  temazepam (RESTORIL) 30 MG capsule Take 1 capsule (30 mg total) by mouth at bedtime. 04/01/19   Johnn Hai, MD    Allergies    Patient has no known allergies.  Review of Systems   Review of Systems Ten systems reviewed and are negative for acute change, except as noted in the HPI.   Physical Exam Updated Vital Signs BP (!) 152/88 (BP Location: Left Arm)   Pulse 70   Temp 99 F (37.2 C) (Oral)   Resp 18   Wt 66.2 kg   SpO2 100%   BMI 27.58 kg/m   Physical Exam Vitals and nursing note reviewed.  Constitutional:      General: She is not in acute distress.    Appearance: She is well-developed. She is not diaphoretic.     Comments: Unkempt   HENT:     Head: Normocephalic and atraumatic.  Eyes:     General: No scleral icterus.    Conjunctiva/sclera: Conjunctivae normal.  Cardiovascular:     Rate and Rhythm: Normal rate and regular rhythm.     Heart sounds: Normal heart sounds. No murmur. No friction rub. No gallop.   Pulmonary:     Effort: Pulmonary effort is normal. No respiratory distress.     Breath sounds: Normal breath sounds.  Abdominal:     General: Bowel sounds are normal. There is no distension.     Palpations: Abdomen is soft. There is no mass.     Tenderness: There is no abdominal tenderness. There is no right CVA tenderness, left CVA tenderness or guarding.  Musculoskeletal:     Cervical back: Normal range of motion.  Skin:    General: Skin is warm and dry.  Neurological:     Mental Status: She is  alert. She is confused.     GCS: GCS eye subscore is 4. GCS verbal subscore is 5. GCS motor subscore is 6.  Psychiatric:        Behavior: Behavior normal.     ED Results / Procedures / Treatments   Labs (all labs ordered are listed, but only abnormal results are displayed) Labs Reviewed  LITHIUM LEVEL - Abnormal; Notable for the following components:      Result Value   Lithium Lvl 1.39 (*)  All other components within normal limits  COMPREHENSIVE METABOLIC PANEL - Abnormal; Notable for the following components:   Sodium 146 (*)    Glucose, Bld 113 (*)    BUN 5 (*)    Creatinine, Ser 1.19 (*)    Calcium 11.0 (*)    GFR calc non Af Amer 51 (*)    GFR calc Af Amer 60 (*)    All other components within normal limits  URINALYSIS, COMPLETE (UACMP) WITH MICROSCOPIC - Abnormal; Notable for the following components:   Leukocytes,Ua LARGE (*)    Bacteria, UA RARE (*)    All other components within normal limits  RAPID URINE DRUG SCREEN, HOSP PERFORMED - Abnormal; Notable for the following components:   Benzodiazepines POSITIVE (*)    All other components within normal limits  RENAL FUNCTION PANEL - Abnormal; Notable for the following components:   Sodium 147 (*)    Chloride 112 (*)    Glucose, Bld 102 (*)    Creatinine, Ser 1.02 (*)    Calcium 10.6 (*)    All other components within normal limits  CBG MONITORING, ED - Abnormal; Notable for the following components:   Glucose-Capillary 100 (*)    All other components within normal limits  SARS CORONAVIRUS 2 (TAT 6-24 HRS)  URINE CULTURE  CBC WITH DIFFERENTIAL/PLATELET  AMMONIA  ETHANOL  LITHIUM LEVEL  LITHIUM LEVEL  LITHIUM LEVEL  TSH  LITHIUM LEVEL    EKG EKG Interpretation  Date/Time:  Monday November 17 2019 14:12:59 EST Ventricular Rate:  70 PR Interval:    QRS Duration: 97 QT Interval:  406 QTC Calculation: 439 R Axis:   -6 Text Interpretation: Sinus rhythm Low voltage, precordial leads Left ventricular  hypertrophy Borderline T abnormalities, diffuse leads Since last tracing on 10/11/19 ischemic abnormality is less notable and QT now normal Confirmed by Daleen Bo [phone]) on 11/17/2019 2:32:25 PM   Radiology CT Head Wo Contrast  Result Date: 11/17/2019 CLINICAL DATA:  Altered mental status. EXAM: CT HEAD WITHOUT CONTRAST TECHNIQUE: Contiguous axial images were obtained from the base of the skull through the vertex without intravenous contrast. COMPARISON:  11/15/2018. Brain MR dated 10/13/2019. FINDINGS: Brain: Normal appearing cerebral hemispheres and posterior fossa structures. Normal size and position of the ventricles. No intracranial hemorrhage, mass lesion or CT evidence of acute infarction. Vascular: No hyperdense vessel or unexpected calcification. Skull: Normal. Negative for fracture or focal lesion. Sinuses/Orbits: Left ethmoid and sphenoid sinus mucosal thickening and mild mucosal thickening/retained secretions in the right maxillary sinus. Unremarkable orbits. Other: None. IMPRESSION: 1. No intracranial abnormality. 2. 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 Electronically Signed   By: Claudie Revering M.D.   On: 11/17/2019 14:53    Procedures .Critical Care Performed by: Margarita Mail, PA-C Authorized by: Margarita Mail, PA-C   Critical care provider statement:    Critical care time (minutes):  5   Critical care time was exclusive of:  Separately billable procedures and treating other patients   Critical care was necessary to treat or prevent imminent or life-threatening deterioration of the following conditions:  Toxidrome (lithium toxicity)   Critical care was time spent personally by me on the following activities:  Discussions with consultants, evaluation of patient's response to treatment, examination of patient, ordering and performing treatments and interventions, ordering and review of laboratory studies, ordering and  review of radiographic studies, pulse oximetry, re-evaluation of patient's condition, obtaining history from patient or surrogate and review of  old charts   (including critical care time)  Medications Ordered in ED Medications  0.9 %  sodium chloride infusion ( Intravenous New Bag/Given 11/18/19 1022)  carvedilol (COREG) tablet 25 mg (25 mg Oral Given 11/18/19 0854)  haloperidol (HALDOL) tablet 20 mg (20 mg Oral Given 11/18/19 0037)  temazepam (RESTORIL) capsule 30 mg (30 mg Oral Given 11/18/19 0037)  benztropine (COGENTIN) tablet 2 mg (2 mg Oral Given 11/18/19 0855)  lamoTRIgine (LAMICTAL) tablet 50 mg (50 mg Oral Given 11/18/19 0855)  heparin injection 5,000 Units (5,000 Units Subcutaneous Given 11/18/19 [id number])  acetaminophen (TYLENOL) tablet 650 mg (has no administration in time range)    Or  acetaminophen (TYLENOL) suppository 650 mg (has no administration in time range)  sodium chloride 0.9 % bolus 1,000 mL (0 mLs Intravenous Stopped 11/17/19 1848)    ED Course  I have reviewed the triage vital signs and the nursing notes.  Pertinent labs & imaging results that were available during my care of the patient were reviewed by me and considered in my medical decision making (see chart for details).  Clinical Course as of Nov 18 1023  Mon Nov 17, 2019  1230 Attempted to call family to obtain history   [AH]  1356 Attempted to call family- no answer   [AH]  1508 Attempted to call the patient's family again - no answer    [AH]    Clinical Course User Index [AH] Margarita Mail, PA-C   MDM Rules/Calculators/A&P                     53 y/o female with AMS, Psych hx with concurrent lithium treatment. The differential diagnosis for AMS is extensive requiring complex medical decision making. It includes, but is not limited to: drug overdose or toxicity - opioids, alcohol, sedatives, antipsychotics, drug withdrawal, others; Metabolic: hypoxia, hypoglycemia, hyperglycemia, hypercalcemia,  hypernatremia, hyponatremia, uremia, hepatic encephalopathy, hypothyroidism, hyperthyroidism, vitamin B12 or thiamine deficiency, carbon monoxide poisoning, Wilson's disease, Lactic acidosis, DKA/HHOS; Infectious: meningitis, encephalitis, bacteremia/sepsis, urinary tract infection, pneumonia, neurosyphilis; Structural: Space-occupying lesion, (brain tumor, subdural hematoma, hydrocephalus,); Vascular: stroke, subarachnoid hemorrhage, coronary ischemia, hypertensive encephalopathy, CNS vasculitis, thrombotic thrombocytopenic purpura, disseminated intravascular coagulation, hyperviscosity; Psychiatric: Schizophrenia, depression; Other: Seizure, hypothermia, heat stroke, ICU psychosis, dementia -"sundowning." I have ordered a repeat urinalysis, cbc, cmp, ekg,  and head CT. I Will attempt to gather more info from family. The patient is HDS     3:40 PM I received a call from The patient's sister, Research officer, trade union. She tells me that she was the one who called EMS (Patient was unsure and thought it was "a friend"). [name] says that she was with the patient when she called confirming patients AMS.[name] tells me the patient has had worsening altered mental status and intermittent confusion for about 2 weeks . She says that she has been unable to wlk since the 14th of Dec. [name] bought the patient a walker, but the patient has been too tremulous and ataxic to hold on the the walker. She says that the patient was admitted to [facility] and discharged on the 12/4. She is unsure if there was a dose adjustment in her medications but states that she tends to have "a breakdown" every year around [place] requiring psych admission. She says that her sxs and confusion are completely different from her usual psych symptoms.   3:52 PM I have high clinical suspicion for chronic Lithium toxicity.Her lithium level is only mildly elevated. Her bun/ and Cr are just  above normal without significant renal insufficiency or change. Sodium  just above normal and likely due to some dehydration.  I spoke with Dr. Justin Mend who will pass the message along to Dr. Carolin Sicks. Dr. Justin Mend agrees that she may benefit from HD, however encourages fluid resuscitation with NS and admission.   I reviewed the patient's Head CT which shows no acute abnormalities on my interpretation and EKG shows NSR at a rate of 70 with improved QT now wnl.  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  Final Clinical Impression(s) / ED Diagnoses Final diagnoses:  Altered mental status, unspecified altered mental status type    Rx / DC Orders ED Discharge Orders    None       Margarita Mail, PA-C 11/18/19 1040    Daleen Bo, MD 11/18/19 2207

## 2019-11-17 NOTE — ED Triage Notes (Signed)
Pt BIB [facility] from home. Per EMS pt diagnosed with UTI last week. Per pt family, symptoms have gotten worse. Per EMS pt normally has tremors but they have worsened since the start of the UTI. VSS. NAD.

## 2019-11-18 ENCOUNTER — Other Ambulatory Visit: Payer: Self-pay

## 2019-11-18 DIAGNOSIS — E870 Hyperosmolality and hypernatremia: Secondary | ICD-10-CM

## 2019-11-18 DIAGNOSIS — T56891A Toxic effect of other metals, accidental (unintentional), initial encounter: Secondary | ICD-10-CM

## 2019-11-18 DIAGNOSIS — G9341 Metabolic encephalopathy: Secondary | ICD-10-CM

## 2019-11-18 DIAGNOSIS — N390 Urinary tract infection, site not specified: Secondary | ICD-10-CM

## 2019-11-18 DIAGNOSIS — E860 Dehydration: Secondary | ICD-10-CM

## 2019-11-18 DIAGNOSIS — G92 Toxic encephalopathy: Principal | ICD-10-CM

## 2019-11-18 LAB — RENAL FUNCTION PANEL
Albumin: 3.7 g/dL (ref 3.5–5.0)
Anion gap: 12 (ref 5–15)
BUN: 6 mg/dL (ref 6–20)
CO2: 23 mmol/L (ref 22–32)
Calcium: 10.6 mg/dL — ABNORMAL HIGH (ref 8.9–10.3)
Chloride: 112 mmol/L — ABNORMAL HIGH (ref 98–111)
Creatinine, Ser: 1.02 mg/dL — ABNORMAL HIGH (ref 0.44–1.00)
GFR calc Af Amer: >60 mL/min (ref >60)
GFR calc non Af Amer: >60 mL/min (ref >60)
Glucose, Bld: 102 mg/dL — ABNORMAL HIGH (ref 70–99)
Phosphorus: 3.4 mg/dL (ref 2.5–4.6)
Potassium: 4.1 mmol/L (ref 3.5–5.1)
Sodium: 147 mmol/L — ABNORMAL HIGH (ref 135–145)

## 2019-11-18 LAB — LITHIUM LEVEL
Lithium Lvl: 1.06 mmol/L (ref 0.60–1.20)
Lithium Lvl: 0.93 mmol/L (ref 0.60–1.20)
Lithium Lvl: 0.89 mmol/L (ref 0.60–1.20)

## 2019-11-18 LAB — TSH: TSH: 0.651 u[IU]/mL (ref 0.350–4.500)

## 2019-11-18 MED ORDER — SODIUM CHLORIDE 0.45 % IV SOLN: INTRAVENOUS | Status: DC

## 2019-11-18 MED ORDER — SODIUM CHLORIDE 0.9% FLUSH
10.0000 mL | INTRAVENOUS | Status: DC | PRN
  Administered 2019-11-21: 10 mL

## 2019-11-18 MED ORDER — SODIUM CHLORIDE 0.9 % IV SOLN
1.0000 g | INTRAVENOUS | Status: DC
  Administered 2019-11-18 – 2019-11-22 (×5): 1 g via INTRAVENOUS
  Filled 2019-11-18 (×5): qty 1

## 2019-11-18 MED ORDER — SODIUM CHLORIDE 0.9% FLUSH
10.0000 mL | Freq: Two times a day (BID) | INTRAVENOUS | Status: DC
  Administered 2019-11-20 – 2019-11-26 (×4): 10 mL

## 2019-11-18 MED ORDER — AMLODIPINE BESYLATE 5 MG PO TABS
5.0000 mg | ORAL_TABLET | Freq: Every day | ORAL | Status: DC
  Administered 2019-11-18 – 2019-11-27 (×10): 5 mg via ORAL
  Filled 2019-11-18 (×10): qty 1

## 2019-11-18 NOTE — Progress Notes (Signed)
PROGRESS NOTE  Paula Dickson [id number] DOB: September 02, 1966 DOA: 11/17/2019 PCP: Sandi Mariscal, MD  Brief History   Paula Dickson is a 53 y.o. female with medical history significant of hypertension, schizophrenia, bipolar affective disorder, anxiety depression, on chronic lithium treatment, presented to the ER for generalized weakness associated with tremors and confusion was sent by her sister for evaluation.  Patient was completed 1 course of 6-day Keflex for UTI yesterday.   Patient sister chorionitis for patient became confused and has new onset of tremors and abnormal speech.   Patient denied any pain, fever chills no cough no dysuria no diarrhea.  ED Course: In the ER, BP mildly elevated. The labs showed lithium level 1.39. Urinalysis 6-10 WBC. She was treated with a liter of normal saline in the ER.  The patient has been admitted to a telemetry bed. Nephrology has been consulted. She is receiving IV fluids, and her lithium has been held.   Consultants  . Nephrology  Procedures  . None  Antibiotics   Anti-infectives (From admission, onward)   None    .  Subjective  The patient is resting comfortably. She remains confused. No new complaints.  Objective   Vitals:  Vitals:   11/18/19 0535 11/18/19 0825  BP: (!) 157/127 (!) 152/88  Pulse: 69 70  Resp: 20 18  Temp: 98.6 F (37 C) 99 F (37.2 C)  SpO2: 100% 100%   Exam:  Constitutional:  . The patient is awake and alert. She remains confused.  No acute distress. Respiratory:  . No increased work of breathing. . No wheezes, rales, or rhonchi . No tactile fremitus Cardiovascular:  . Regular rate and rhythm . No murmurs, ectopy, or gallups. . No lateral PMI. No thrills. Abdomen:  . Abdomen is soft, non-tender, non-distended . No hernias, masses, or organomegaly . Normoactive bowel sounds.  Musculoskeletal:  . No cyanosis, clubbing, or edema Skin:  . No rashes, lesions, ulcers . palpation  of skin: no induration or nodules Neurologic:  . CN 2-12 intact . Sensation all 4 extremities intact Psychiatric:  . Mental status o Mood, affect appropriate o Orientation to person, place, time  . judgment and insight appear intact I have personally reviewed the following:   Today's Data  . Vitals, BMP, UA, Urine culture  Micro Data  . Urine culture: gram negative rods. Recent urine culture positive for strep agalactiae  Scheduled Meds: . amLODipine  5 mg Oral Daily  . benztropine  2 mg Oral BID  . carvedilol  25 mg Oral BID WC  . haloperidol  20 mg Oral QHS  . heparin  5,000 Units Subcutaneous Q8H  . lamoTRIgine  50 mg Oral BID  . temazepam  30 mg Oral QHS   Continuous Infusions:  Active Problems:   Acute metabolic encephalopathy   AMS (altered mental status)   Lithium toxicity UTI   LOS: 1 day   A & P  Toxic metabolic encephalopathy: Due to lithium toxicity and UTI  UTI: The patient is unable to tell me if she has had symptoms of dysuria or not, but her UA is positive for UTI. Urine culture has grown out gram negative rods. Urine culture obtained on 11/11/2019 grew out strep agalactiae. Will start IV rocephin.  Lithium Toxicity: With renal insufficiency. Lithium has been held. Monitor. I appreciate nephrology's assistance.  Bipolar affective dysorder, anxiety and depression: Lithium has been held. Consider consulting psychiatry once patient is less confused to see about getting her off of  lithium.  Hypernatremia: continue hydration  Dehydration: IV fluids.   I have seen and examined this patient myself. I have spent 32 minutes in her evaluation and care.  DVT prophylaxis: heparin Code Status: Full Code Family Communication: None available Disposition Plan: tbd  Celes Dedic, DO [facility] Direct contact: see [email or web address]  7PM-7AM contact night coverage as above 11/18/2019, 2:01 PM  LOS: 1 day

## 2019-11-18 NOTE — Plan of Care (Signed)
  Problem: Urinary Elimination: Goal: Signs and symptoms of infection will decrease Outcome: Progressing   Problem: Clinical Measurements: Goal: Diagnostic test results will improve Outcome: Progressing   Problem: Safety: Goal: Ability to remain free from injury will improve Outcome: Progressing

## 2019-11-18 NOTE — Progress Notes (Signed)
New Admission Note: Late Entry   Arrival Method: stretcher Mental Orientation: alert to self  Telemetry: 17 Assessment: Completed Skin:see flowsheet IV: Right AC Pain: none Tubes: purwick Safety Measures: Safety Fall Prevention Plan has been discussed Admission: Completed [facility] Orientation: Patient has been oriented to the room, unit and staff.  Family: none at bedside  Orders have been reviewed and implemented. Will continue to monitor the patient. Call light has been placed within reach and bed alarm has been activated.   Rockie Neighbours BSN, RN Phone number: [phone]

## 2019-11-18 NOTE — Progress Notes (Addendum)
[facility] NEPHROLOGY PROGRESS NOTE  Assessment/ Plan: Pt is a 53 y.o. yo female  with history of hypertension, schizophrenia, bipolar affective disorder, anxiety depression, on chronic lithium treatment, presented to the ER for generalized weakness associated with tremors and change in mental status, seen as a consultation  for possible lithium toxicity.  #Altered mental status, tremor: Initially suspected lithium toxicity in ER which is ruled out. Lithium level is not elevated in serial lab test.  Normal creatinine level.  She denied lithium overdose. Her mental status significantly improved today. Discontinue IV fluid.  Encourage oral intake. Ammonia and ethanol level not elevated.    #Possible UTI: Follow-up urine culture and antibiotics per primary team.  Reportedly she was recently diagnosed with UTI.  Unknown how long she took antibiotics.  #Hypertension: Blood pressure is elevated.  On Coreg.  I will add amlodipine.  #Mild hypernatremia: DC IV fluid.  Encourage increased intake of free water.  Nothing further to add.  Sign off, please call back with question.  Subjective: Seen and examined at bedside.  She is more alert awake orientated today.  Denies nausea vomiting chest pain shortness of breath.  No headache or dizziness.  Denies intake of excessive amount of lithium. Objective Vital signs in last 24 hours: Vitals:   11/17/19 2125 11/17/19 2224 11/18/19 0535 11/18/19 0825  BP: (!) 159/88 (!) 148/92 (!) 157/127 (!) 152/88  Pulse: 70 60 69 70  Resp: 19 20 20 18   Temp:  (!) 100.7 F (38.2 C) 98.6 F (37 C) 99 F (37.2 C)  TempSrc:  Oral Oral Oral  SpO2: 97% 99% 100% 100%  Weight:  66.2 kg     Weight change:   Intake/Output Summary (Last 24 hours) at 11/18/2019 1144 Last data filed at 11/18/2019 1022 Gross per 24 hour  Intake 2698.31 ml  Output 1200 ml  Net 1498.31 ml       Labs: Basic Metabolic Panel: Recent Labs  Lab 11/11/19 1248  11/17/19 1245 11/18/19 0433  NA 140 146* 147*  K 3.7 3.9 4.1  CL 105 111 112*  CO2 27 26 23   GLUCOSE 107* 113* 102*  BUN 8 5* 6  CREATININE 1.25* 1.19* 1.02*  CALCIUM 10.9* 11.0* 10.6*  PHOS  --   --  3.4   Liver Function Tests: Recent Labs  Lab 11/11/19 1248 11/17/19 1245 11/18/19 0433  AST 15 17  --   ALT 19 22  --   ALKPHOS 106 117  --   BILITOT 0.9 0.4  --   PROT 7.0 7.0  --   ALBUMIN 4.0 4.0 3.7   No results for input(s): LIPASE, AMYLASE in the last 168 hours. Recent Labs  Lab 11/17/19 1245  AMMONIA 19   CBC: Recent Labs  Lab 11/11/19 1248 11/17/19 1249  WBC 8.1 9.9  NEUTROABS 5.8 7.4  HGB 13.2 14.4  HCT 41.5 45.4  MCV 89.2 89.9  PLT 234 271   Cardiac Enzymes: No results for input(s): CKTOTAL, CKMB, CKMBINDEX, TROPONINI in the last 168 hours. CBG: Recent Labs  Lab 11/17/19 2113  GLUCAP 100*    Iron Studies: No results for input(s): IRON, TIBC, TRANSFERRIN, FERRITIN in the last 72 hours. Studies/Results: CT Head Wo Contrast  Result Date: 11/17/2019 CLINICAL DATA:  Altered mental status. EXAM: CT HEAD WITHOUT CONTRAST TECHNIQUE: Contiguous axial images were obtained from the base of the skull through the vertex without intravenous contrast. COMPARISON:  11/15/2018. Brain MR dated 10/13/2019. FINDINGS: Brain: Normal appearing cerebral  hemispheres and posterior fossa structures. Normal size and position of the ventricles. No intracranial hemorrhage, mass lesion or CT evidence of acute infarction. Vascular: No hyperdense vessel or unexpected calcification. Skull: Normal. Negative for fracture or focal lesion. Sinuses/Orbits: Left ethmoid and sphenoid sinus mucosal thickening and mild mucosal thickening/retained secretions in the right maxillary sinus. Unremarkable orbits. Other: None. IMPRESSION: 1. No intracranial abnormality. 2. 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 Electronically Signed   By: Claudie Revering M.D.   On: 11/17/2019 14:53    Medications: Infusions: . sodium chloride 150 mL/hr at 11/18/19 1022    Scheduled Medications: . benztropine  2 mg Oral BID  . carvedilol  25 mg Oral BID WC  . haloperidol  20 mg Oral QHS  . heparin  5,000 Units Subcutaneous Q8H  . lamoTRIgine  50 mg Oral BID  . temazepam  30 mg Oral QHS    have reviewed scheduled and prn medications.  Physical Exam: General:NAD, comfortable Heart:RRR, s1s2 nl Lungs:clear b/l, no crackle Abdomen:soft, Non-tender, non-distended Extremities:No edema Neurology: Alert, awake, "oriented, no tremor or asterixis. Lakeisa Heninger Prasad Romen Yutzy 11/18/2019,11:44 AM  LOS: 1 day  Pager: [phone]

## 2019-11-19 DIAGNOSIS — I10 Essential (primary) hypertension: Secondary | ICD-10-CM

## 2019-11-19 DIAGNOSIS — R4182 Altered mental status, unspecified: Secondary | ICD-10-CM

## 2019-11-19 LAB — COMPREHENSIVE METABOLIC PANEL
ALT: 20 U/L (ref 0–44)
AST: 14 U/L — ABNORMAL LOW (ref 15–41)
Albumin: 3.7 g/dL (ref 3.5–5.0)
Alkaline Phosphatase: 105 U/L (ref 38–126)
Anion gap: 8 (ref 5–15)
BUN: 7 mg/dL (ref 6–20)
CO2: 27 mmol/L (ref 22–32)
Calcium: 11.1 mg/dL — ABNORMAL HIGH (ref 8.9–10.3)
Chloride: 116 mmol/L — ABNORMAL HIGH (ref 98–111)
Creatinine, Ser: 1.17 mg/dL — ABNORMAL HIGH (ref 0.44–1.00)
GFR calc Af Amer: >60 mL/min (ref >60)
GFR calc non Af Amer: 52 mL/min — ABNORMAL LOW (ref >60)
Glucose, Bld: 102 mg/dL — ABNORMAL HIGH (ref 70–99)
Potassium: 3.7 mmol/L (ref 3.5–5.1)
Sodium: 151 mmol/L — ABNORMAL HIGH (ref 135–145)
Total Bilirubin: 0.8 mg/dL (ref 0.3–1.2)
Total Protein: 6.7 g/dL (ref 6.5–8.1)

## 2019-11-19 LAB — CBC WITH DIFFERENTIAL/PLATELET
Abs Immature Granulocytes: 0.02 10*3/uL (ref 0.00–0.07)
Basophils Absolute: 0.1 10*3/uL (ref 0.0–0.1)
Basophils Relative: 1 %
Eosinophils Absolute: 0.2 10*3/uL (ref 0.0–0.5)
Eosinophils Relative: 2 %
HCT: 39.9 % (ref 36.0–46.0)
Hemoglobin: 12.7 g/dL (ref 12.0–15.0)
Immature Granulocytes: 0 %
Lymphocytes Relative: 25 %
Lymphs Abs: 2.4 10*3/uL (ref 0.7–4.0)
MCH: 28.5 pg (ref 26.0–34.0)
MCHC: 31.8 g/dL (ref 30.0–36.0)
MCV: 89.5 fL (ref 80.0–100.0)
Monocytes Absolute: 0.7 10*3/uL (ref 0.1–1.0)
Monocytes Relative: 7 %
Neutro Abs: 6.1 10*3/uL (ref 1.7–7.7)
Neutrophils Relative %: 65 %
Platelets: 274 10*3/uL (ref 150–400)
RBC: 4.46 MIL/uL (ref 3.87–5.11)
RDW: 13.9 % (ref 11.5–15.5)
WBC: 9.3 10*3/uL (ref 4.0–10.5)
nRBC: 0 % (ref 0.0–0.2)

## 2019-11-19 LAB — LITHIUM LEVEL: Lithium Lvl: 0.65 mmol/L (ref 0.60–1.20)

## 2019-11-19 MED ORDER — DEXTROSE 5 % IV SOLN
INTRAVENOUS | Status: DC
  Administered 2019-11-25: 500 mL via INTRAVENOUS

## 2019-11-19 NOTE — Progress Notes (Signed)
PROGRESS NOTE    Paula Dickson  [id number] DOB: [birth year] DOA: 11/17/2019 PCP: Sandi Mariscal, MD   Brief Narrative:  HPI On 11/17/2019 by Dr. Wynetta Fines Paula Dickson is a 53 y.o. female with medical history significant of hypertension, schizophrenia, bipolar affective disorder, anxiety depression, on chronic lithium treatment, presented to the ER for generalized weakness associated with tremors and confusion was sent by her sister for evaluation.  Patient was completed 1 course of 6-day Keflex for UTI yesterday.   Patient sister chorionitis for patient became confused and has new onset of tremors and abnormal speech.   Patient denied any pain, fever chills no cough no dysuria no diarrhea.  Interim history Patient admitted with lithium toxicity and hyponatremia.  Nephrology was consulted.  Currently on IV fluids. Assessment & Plan   Acute metabolic and toxic encephalopathy -Suspect multifactorial including lithium toxicity and UTI -Patient currently alert and oriented to self -Continue to monitor  Possible UTI -Patient unable to voice his symptoms of dysuria -UA rare bacteria, 6-10 WBC, large leukocytes -urine culture ony showed 10K GNR -Blood culture show no growth -Currently on ceftriaxone  Lithium toxicity with acute renal insufficiency -Lithium level on admission was 1.39 -Lithium has been held, however level still within normal limits -Creatinine on admission was 1.19, currently 1.17-Baseline appears to be below 1.  GFR has maintained over 49 -Nephrology was consulted and appreciated and is since signed off  Bipolar affective disorder with anxiety and depression -As above lithium held -Continue haldol, lamictal -will likely need to consult psychiatry   Hypernatremia -Will change half-normal saline to D5 water as sodium currently 151 -Continue to monitor BMP  Dehydration -Continue IV fluids  Essential hypertension -continue amlodipine,  coreg  Dystonia -Continue congentin  DVT Prophylaxis  heparin  Code Status: Full  Family Communication: None at bedside  Disposition Plan: Admitted. Pending improvement. Dispo TBD  Consultants Nephrology  Procedures  None  Antibiotics   Anti-infectives (From admission, onward)   Start     Dose/Rate Route Frequency Ordered Stop   11/18/19 1445  cefTRIAXone (ROCEPHIN) 1 g in sodium chloride 0.9 % 100 mL IVPB     1 g 200 mL/hr over 30 Minutes Intravenous Every 24 hours 11/18/19 1431        Subjective:   Paula Dickson seen and examined today.  Patient has no complaints today. Denies chest pain, shortness of breath, abdominal pain, N/V. States her hands always shake.  Objective:   Vitals:   11/18/19 0825 11/18/19 1726 11/18/19 2224 11/19/19 0526  BP: (!) 152/88 (!) 173/81 (!) 153/84 (!) 120/110  Pulse: 70 64 80 77  Resp: 18  16 20   Temp: 99 F (37.2 C) 98.6 F (37 C) 99.3 F (37.4 C) 98.9 F (37.2 C)  TempSrc: Oral Oral Oral Oral  SpO2: 100% 98% 98% 97%  Weight:   68.2 kg     Intake/Output Summary (Last 24 hours) at 11/19/2019 [id number] Last data filed at 11/19/2019 0600 Gross per 24 hour  Intake 1797.87 ml  Output 1400 ml  Net 397.87 ml   Filed Weights   11/17/19 2224 11/18/19 2224  Weight: 66.2 kg 68.2 kg    Exam  General: Well developed, well nourished, NAD, appears stated age  HEENT: NCAT, mucous membranes moist.   Cardiovascular: S1 S2 auscultated, RRR  Respiratory: Clear to auscultation bilaterally   Abdomen: Soft, nontender, nondistended, + bowel sounds  Extremities: warm dry without cyanosis clubbing or edema  Neuro: AAOx1 (self only),  tremor otherwise nonfocal  Psych: Confused, however appropriate   Data Reviewed: I have personally reviewed following labs and imaging studies  CBC: Recent Labs  Lab 11/17/19 1249 11/19/19 0510  WBC 9.9 9.3  NEUTROABS 7.4 6.1  HGB 14.4 12.7  HCT 45.4 39.9  MCV 89.9 89.5  PLT 271 [id number]   Basic  Metabolic Panel: Recent Labs  Lab 11/17/19 1245 11/18/19 0433 11/19/19 0510  NA 146* 147* 151*  K 3.9 4.1 3.7  CL 111 112* 116*  CO2 26 23 27   GLUCOSE 113* 102* 102*  BUN 5* 6 7  CREATININE 1.19* 1.02* 1.17*  CALCIUM 11.0* 10.6* 11.1*  PHOS  --  3.4  --    GFR: Estimated Creatinine Clearance: 48 mL/min (A) (by C-G formula based on SCr of 1.17 mg/dL (H)). Liver Function Tests: Recent Labs  Lab 11/17/19 1245 11/18/19 0433 11/19/19 0510  AST 17  --  14*  ALT 22  --  20  ALKPHOS 117  --  105  BILITOT 0.4  --  0.8  PROT 7.0  --  6.7  ALBUMIN 4.0 3.7 3.7   No results for input(s): LIPASE, AMYLASE in the last 168 hours. Recent Labs  Lab 11/17/19 1245  AMMONIA 19   Coagulation Profile: No results for input(s): INR, PROTIME in the last 168 hours. Cardiac Enzymes: No results for input(s): CKTOTAL, CKMB, CKMBINDEX, TROPONINI in the last 168 hours. BNP (last 3 results) No results for input(s): PROBNP in the last 8760 hours. HbA1C: No results for input(s): HGBA1C in the last 72 hours. CBG: Recent Labs  Lab 11/17/19 2113  GLUCAP 100*   Lipid Profile: No results for input(s): CHOL, HDL, LDLCALC, TRIG, CHOLHDL, LDLDIRECT in the last 72 hours. Thyroid Function Tests: Recent Labs    11/18/19 0433  TSH 0.651   Anemia Panel: No results for input(s): VITAMINB12, FOLATE, FERRITIN, TIBC, IRON, RETICCTPCT in the last 72 hours. Urine analysis:    Component Value Date/Time   COLORURINE YELLOW 11/17/2019 [place] 11/17/2019 1541   LABSPEC 1.005 11/17/2019 1541   PHURINE 7.0 11/17/2019 1541   GLUCOSEU NEGATIVE 11/17/2019 1541   HGBUR NEGATIVE 11/17/2019 1541   BILIRUBINUR NEGATIVE 11/17/2019 1541   BILIRUBINUR neg 12/27/2015 1421   KETONESUR NEGATIVE 11/17/2019 1541   PROTEINUR NEGATIVE 11/17/2019 1541   UROBILINOGEN 0.2 12/27/2015 1421   UROBILINOGEN 0.2 06/14/2015 0040   NITRITE NEGATIVE 11/17/2019 1541   LEUKOCYTESUR LARGE (A) 11/17/2019 1541    Sepsis Labs: @LABRCNTIP (procalcitonin:4,lacticidven:4)  ) Recent Results (from the past 240 hour(s))  SARS CORONAVIRUS 2 (TAT 6-24 HRS) Nasopharyngeal Nasopharyngeal Swab     Status: None   Collection Time: 11/17/19  3:26 PM   Specimen: Nasopharyngeal Swab  Result Value Ref Range Status   SARS Coronavirus 2 NEGATIVE NEGATIVE Final    Comment: (NOTE) SARS-CoV-2 target nucleic acids are NOT DETECTED. The SARS-CoV-2 RNA is generally detectable in upper and lower respiratory specimens during the acute phase of infection. Negative results do not preclude SARS-CoV-2 infection, do not rule out co-infections with other pathogens, and should not be used as the sole basis for treatment or other patient management decisions. Negative results must be combined with clinical observations, patient history, and epidemiological information. The expected result is Negative. Fact Sheet for Patients: SugarRoll.be Fact Sheet for Healthcare Providers: https://www.woods-mathews.com/ This test is not yet approved or cleared by the [place] FDA and  has been authorized for detection and/or diagnosis of SARS-CoV-2 by FDA under an Emergency  Use Authorization (EUA). This EUA will remain  in effect (meaning this test can be used) for the duration of the COVID-19 declaration under Section 56 4(b)(1) of the Act, 21 U.S.C. section 360bbb-3(b)(1), unless the authorization is terminated or revoked sooner. Performed at [facility], [place] [address], [place], [place] [address]   Urine culture     Status: Abnormal (Preliminary result)   Collection Time: 11/17/19  6:41 PM   Specimen: Urine, Clean Catch  Result Value Ref Range Status   Specimen Description URINE, CLEAN CATCH  Final   Special Requests NONE  Final   Culture (A)  Final    10,000 COLONIES/mL GRAM NEGATIVE RODS CULTURE REINCUBATED FOR BETTER GROWTH Performed at [facility], [place] [address] [place], [place] [address]    Report Status PENDING  Incomplete  Culture, blood (routine x 2)     Status: None (Preliminary result)   Collection Time: 11/18/19  2:57 PM   Specimen: BLOOD LEFT HAND  Result Value Ref Range Status   Specimen Description BLOOD LEFT HAND  Final   Special Requests   Final    BOTTLES DRAWN AEROBIC AND ANAEROBIC Blood Culture adequate volume   Culture   Final    NO GROWTH < 24 HOURS Performed at [facility], [place] [address] [place], [place] [address]    Report Status PENDING  Incomplete  Culture, blood (routine x 2)     Status: None (Preliminary result)   Collection Time: 11/18/19  3:04 PM   Specimen: BLOOD RIGHT HAND  Result Value Ref Range Status   Specimen Description BLOOD RIGHT HAND  Final   Special Requests   Final    BOTTLES DRAWN AEROBIC AND ANAEROBIC Blood Culture adequate volume   Culture   Final    NO GROWTH < 24 HOURS Performed at [facility], [place] [address], [place], [place] [address]    Report Status PENDING  Incomplete      Radiology Studies: CT Head Wo Contrast  Result Date: 11/17/2019 CLINICAL DATA:  Altered mental status. EXAM: CT HEAD WITHOUT CONTRAST TECHNIQUE: Contiguous axial images were obtained from the base of the skull through the vertex without intravenous contrast. COMPARISON:  11/15/2018. Brain MR dated 10/13/2019. FINDINGS: Brain: Normal appearing cerebral hemispheres and posterior fossa structures. Normal size and position of the ventricles. No intracranial hemorrhage, mass lesion or CT evidence of acute infarction. Vascular: No hyperdense vessel or unexpected calcification. Skull: Normal. Negative for fracture or focal lesion. Sinuses/Orbits: Left ethmoid and sphenoid sinus mucosal thickening and mild mucosal thickening/retained secretions in the right maxillary sinus. Unremarkable orbits. Other: None. IMPRESSION: 1. No intracranial abnormality. 2. 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 Electronically Signed   By: Claudie Revering M.D.   On: 11/17/2019 14:53     Scheduled Meds: . amLODipine  5 mg Oral Daily  . benztropine  2 mg Oral BID  . carvedilol  25 mg Oral BID WC  . haloperidol  20 mg Oral QHS  . heparin  5,000 Units Subcutaneous Q8H  . lamoTRIgine  50 mg Oral BID  . sodium chloride flush  10-40 mL Intracatheter Q12H  . temazepam  30 mg Oral QHS   Continuous Infusions: . cefTRIAXone (ROCEPHIN)  IV 200 mL/hr at 11/18/19 1600  . dextrose 75 mL/hr at 11/19/19 0743     LOS: 2 days   Time Spent in minutes   45 minutes  Shannie Kontos D.O. on 11/19/2019 at 9:27 AM  Between 7am to 7pm - Please see pager noted on [email or web address]  After 7pm go to [email or web address]  And look for the night coverage person covering for me after hours  [facility] Office  [phone]

## 2019-11-19 NOTE — Progress Notes (Signed)
Patient keeps removing tele off. Patient alert to self and anxious remains awake at this time.  Will continue to monitor. Kept dry and comfortable. Bed linens changed twice due to incontinence.

## 2019-11-20 LAB — BASIC METABOLIC PANEL
Anion gap: 11 (ref 5–15)
BUN: 7 mg/dL (ref 6–20)
CO2: 28 mmol/L (ref 22–32)
Calcium: 10.4 mg/dL — ABNORMAL HIGH (ref 8.9–10.3)
Chloride: 104 mmol/L (ref 98–111)
Creatinine, Ser: 1.2 mg/dL — ABNORMAL HIGH (ref 0.44–1.00)
GFR calc Af Amer: 59 mL/min — ABNORMAL LOW (ref >60)
GFR calc non Af Amer: 51 mL/min — ABNORMAL LOW (ref >60)
Glucose, Bld: 110 mg/dL — ABNORMAL HIGH (ref 70–99)
Potassium: 3.4 mmol/L — ABNORMAL LOW (ref 3.5–5.1)
Sodium: 143 mmol/L (ref 135–145)

## 2019-11-20 MED ORDER — POTASSIUM CHLORIDE CRYS ER 20 MEQ PO TBCR
40.0000 meq | EXTENDED_RELEASE_TABLET | Freq: Once | ORAL | Status: AC
  Administered 2019-11-20: 40 meq via ORAL
  Filled 2019-11-20: qty 2

## 2019-11-20 NOTE — Progress Notes (Signed)
CSW left voicemail for patient's sister to discuss discharge plan.  Percell Locus Bright Spielmann LCSW [phone]

## 2019-11-20 NOTE — Evaluation (Signed)
Physical Therapy Evaluation Patient Details Name: Paula Dickson MRN: [id number] DOB: 1966/09/01 Today's Date: 11/20/2019   History of Present Illness  53yo female presenting with confusion, tremors, and weakness. Admitted with acute metabolic and toxic encephalopathy secondary to lithium toxicity and potential UTI. PMH schizophrenia, HTN, bipolar disorder, anxiety  Clinical Impression   Patient received in bed, very pleasant but A&Ox1, smiling and cooperative throughout session. Required Min-ModA for functional bed mobility, Mod-maxA for functional transfers with RW, and min guard with RW but Max cues to take side steps up EOB due to significant difficulty with processing and sequencing this afternoon. Easily fatigued. She was left in bed with all needs met, bed alarm active. Currently recommending SNF and 24/7A moving forward.     Follow Up Recommendations SNF;Supervision/Assistance - 24 hour    Equipment Recommendations  Rolling walker with 5" wheels;3in1 (PT)    Recommendations for Other Services       Precautions / Restrictions Precautions Precautions: Fall Restrictions Weight Bearing Restrictions: No      Mobility  Bed Mobility Overal bed mobility: Needs Assistance Bed Mobility: Supine to Sit;Sit to Supine     Supine to sit: Min assist Sit to supine: Mod assist   General bed mobility comments: MinA to boost trunk to upright then ModA to compeltely scoot forward to EOB; needed [place] on return to bed for LE management  Transfers Overall transfer level: Needs assistance Equipment used: Rolling walker (2 wheeled) Transfers: Sit to/from Stand Sit to Stand: Mod assist;Max assist         General transfer comment: MaxA on first attempt, faded to [facility] with repeated standing, difficulty following cues for hand placement with RW  Ambulation/Gait Ambulation/Gait assistance: Min guard Gait Distance (Feet): 2 Feet Assistive device: Rolling walker (2 wheeled) Gait  Pattern/deviations: Step-to pattern Gait velocity: decreased   General Gait Details: side steps along EOB, very short and with difficulty sequencing for weight shifting and progressing steps, required Max simple step by step cues just to sidestep 85f along EOB, very easily fatigued  Stairs            Wheelchair Mobility    Modified Rankin (Stroke Patients Only)       Balance Overall balance assessment: Needs assistance Sitting-balance support: Bilateral upper extremity supported;Feet supported Sitting balance-Leahy Scale: Poor Sitting balance - Comments: R lateral lean requiring Min guard-MinA to maintain upright Postural control: Right lateral lean Standing balance support: Bilateral upper extremity supported;During functional activity Standing balance-Leahy Scale: Poor Standing balance comment: reliant on B UE and external support                             Pertinent Vitals/Pain Pain Assessment: No/denies pain    Home Living Family/patient expects to be discharged to:: Private residence Living Arrangements: Alone Available Help at Discharge: Family;Available PRN/intermittently Type of Home: House Home Access: Stairs to enter Entrance Stairs-Rails: Can reach both Entrance Stairs-Number of Steps: 5 Home Layout: One level Home Equipment: None Additional Comments: patient provides this history but does have cognitive and memory deficits so unsure of accuracy    Prior Function Level of Independence: Independent         Comments: per family, when pt is well she drives and does own meds.  When she isn't doing well, she doesn't believe she can walk. (taken from prior charting)     Hand Dominance        Extremity/Trunk Assessment  Upper Extremity Assessment Upper Extremity Assessment: Defer to OT evaluation    Lower Extremity Assessment Lower Extremity Assessment: Generalized weakness    Cervical / Trunk Assessment Cervical / Trunk  Assessment: Kyphotic  Communication   Communication: No difficulties  Cognition Arousal/Alertness: Awake/alert Behavior During Therapy: WFL for tasks assessed/performed Overall Cognitive Status: No family/caregiver present to determine baseline cognitive functioning Area of Impairment: Orientation;Attention;Memory;Following commands;Safety/judgement;Awareness;Problem solving                 Orientation Level: Disoriented to;Place;Situation;Time Current Attention Level: Sustained Memory: Decreased short-term memory;Decreased recall of precautions Following Commands: Follows one step commands consistently;Follows one step commands with increased time Safety/Judgement: Decreased awareness of safety;Decreased awareness of deficits Awareness: Intellectual Problem Solving: Slow processing;Decreased initiation;Difficulty sequencing;Requires verbal cues;Requires tactile cues General Comments: "I am in [place], that's all I know", able to use advertisement on TV to figure out it is almost christmas but unsure of what year      General Comments      Exercises     Assessment/Plan    PT Assessment Patient needs continued PT services  PT Problem List Decreased strength;Decreased cognition;Decreased knowledge of use of DME;Obesity;Decreased activity tolerance;Decreased safety awareness;Decreased balance;Decreased knowledge of precautions;Decreased mobility;Decreased coordination       PT Treatment Interventions DME instruction;Balance training;Gait training;Neuromuscular re-education;Stair training;Cognitive remediation;Functional mobility training;Patient/family education;Therapeutic activities;Therapeutic exercise    PT Goals (Current goals can be found in the Care Plan section)  Acute Rehab PT Goals Patient Stated Goal: feel better, go home PT Goal Formulation: With patient Time For Goal Achievement: 12/04/19 Potential to Achieve Goals: Good    Frequency Min 2X/week    Barriers to discharge        Co-evaluation               AM-PAC PT "6 Clicks" Mobility  Outcome Measure Help needed turning from your back to your side while in a flat bed without using bedrails?: A Little Help needed moving from lying on your back to sitting on the side of a flat bed without using bedrails?: A Little Help needed moving to and from a bed to a chair (including a wheelchair)?: A Lot Help needed standing up from a chair using your arms (e.g., wheelchair or bedside chair)?: A Lot Help needed to walk in hospital room?: A Lot Help needed climbing 3-5 steps with a railing? : Total 6 Click Score: 13    End of Session Equipment Utilized During Treatment: Gait belt Activity Tolerance: Patient limited by fatigue;Patient tolerated treatment well Patient left: in bed;with call bell/phone within reach;with bed alarm set   PT Visit Diagnosis: Unsteadiness on feet (R26.81);Difficulty in walking, not elsewhere classified (R26.2);Muscle weakness (generalized) (M62.81)    Time: 1287-8676 PT Time Calculation (min) (ACUTE ONLY): 20 min   Charges:   PT Evaluation $PT Eval Moderate Complexity: 1 Mod          Windell Norfolk, DPT, PN1   Supplemental Physical Therapist [place]    Pager [phone] Acute Rehab Office [phone]

## 2019-11-20 NOTE — Progress Notes (Signed)
Pt has had two episodes of vomiting partially digested food over the last hour. On both occasions, patient was heard coughing and then was found with a large amount of vomitus on the bed. The patient states her stomach is fine now and does not need medication for nausea.

## 2019-11-20 NOTE — Consult Note (Addendum)
[facility] Consult   Reason for Consult:  History of bipolar and depression, presents with lithium toxicity Referring Physician:  Dr Ree Kida Patient Identification: Paula Dickson MRN:  [id number] Principal Diagnosis: Acute metabolic encephalopathy Diagnosis:  Principal Problem:   Acute metabolic encephalopathy Active Problems:   AMS (altered mental status)   Lithium toxicity   Total Time spent with patient: 30 minutes  Subjective:   Paula Dickson is a 53 y.o. female patient admitted with metabolic encephalopathy, likely related to UTI and lithium toxicity.  Patient assessed by nurse practitioner.  Patient well-known to this provider.  Patient currently alert to self only.  Patient appears confused at times, attempts to participate in assessment. Patient denies suicidal and homicidal ideations today.  Patient denies hallucinations. Patient discussed with Dr. Parke Poisson, psychiatry will continue to follow patient.  HPI:  Per MD: Paula Dickson is a 53 y.o. female with medical history significant of hypertension, schizophrenia, bipolar affective disorder, anxiety depression, on chronic lithium treatment, presented to the ER for generalized weakness associated with tremors and confusion was sent by her sister for evaluation.  Past Psychiatric History: Bipolar disorder, depression, schizophrenia  Risk to Self:  Denies Risk to Others:  Denies Prior Inpatient Therapy:  Yes Prior Outpatient Therapy:  Yes  Past Medical History:  Past Medical History:  Diagnosis Date  . Bipolar affective disorder ([place])   . History of arthritis   . History of chicken pox   . History of depression   . History of genital warts   . history of heart murmur   . History of high blood pressure   . History of thyroid disease   . History of UTI   . Hypertension   . Low TSH level 07/13/2017  . Schizophrenia [facility])     Past Surgical History:  Procedure Laterality Date  .  ABLATION ON ENDOMETRIOSIS    . CYST REMOVAL NECK     around 11 years ago /benign  . MULTIPLE TOOTH EXTRACTIONS     Family History:  Family History  Problem Relation Age of Onset  . Arthritis Father   . Hyperlipidemia Father   . High blood pressure Father   . Diabetes Sister   . Diabetes Mother   . Diabetes Brother   . Mental illness Brother   . Alcohol abuse Paternal Uncle   . Alcohol abuse Paternal Grandfather   . Breast cancer Maternal Aunt   . Breast cancer Paternal Aunt   . High blood pressure Sister   . Mental illness Other        runs in family   Family Psychiatric  History: Denies Social History:  Social History   Substance and Sexual Activity  Alcohol Use No     Social History   Substance and Sexual Activity  Drug Use No    Social History   Socioeconomic History  . Marital status: Single    Spouse name: Not on file  . Number of children: Not on file  . Years of education: Not on file  . Highest education level: Not on file  Occupational History  . Not on file  Tobacco Use  . Smoking status: Never Smoker  . Smokeless tobacco: Never Used  Substance and Sexual Activity  . Alcohol use: No  . Drug use: No  . Sexual activity: Not Currently  Other Topics Concern  . Not on file  Social History Narrative  . Not on file   Social Determinants of Health  Financial Resource Strain:   . Difficulty of Paying Living Expenses: Not on file  Food Insecurity:   . Worried About Charity fundraiser in the Last Year: Not on file  . Ran Out of Food in the Last Year: Not on file  Transportation Needs:   . Lack of Transportation (Medical): Not on file  . Lack of Transportation (Non-Medical): Not on file  Physical Activity:   . Days of Exercise per Week: Not on file  . Minutes of Exercise per Session: Not on file  Stress:   . Feeling of Stress : Not on file  Social Connections:   . Frequency of Communication with Friends and Family: Not on file  . Frequency of  Social Gatherings with Friends and Family: Not on file  . Attends Religious Services: Not on file  . Active Member of Clubs or Organizations: Not on file  . Attends [name] Meetings: Not on file  . Marital Status: Not on file   Additional Social History:    Allergies:  No Known Allergies  Labs:  Results for orders placed or performed during the hospital encounter of 11/17/19 (from the past 48 hour(s))  Culture, blood (routine x 2)     Status: None (Preliminary result)   Collection Time: 11/18/19  2:57 PM   Specimen: BLOOD LEFT HAND  Result Value Ref Range   Specimen Description BLOOD LEFT HAND    Special Requests      BOTTLES DRAWN AEROBIC AND ANAEROBIC Blood Culture adequate volume   Culture      NO GROWTH 2 DAYS Performed at [facility], [place] [address], [place], [place] [address]    Report Status PENDING   Culture, blood (routine x 2)     Status: None (Preliminary result)   Collection Time: 11/18/19  3:04 PM   Specimen: BLOOD RIGHT HAND  Result Value Ref Range   Specimen Description BLOOD RIGHT HAND    Special Requests      BOTTLES DRAWN AEROBIC AND ANAEROBIC Blood Culture adequate volume   Culture      NO GROWTH 2 DAYS Performed at [facility], [place] [address] [place], [place] [address]    Report Status PENDING   CBC with Differential/Platelet     Status: None   Collection Time: 11/19/19  5:10 AM  Result Value Ref Range   WBC 9.3 4.0 - 10.5 K/uL   RBC 4.46 3.87 - 5.11 MIL/uL   Hemoglobin 12.7 12.0 - 15.0 g/dL   HCT 39.9 36.0 - 46.0 %   MCV 89.5 80.0 - 100.0 fL   MCH 28.5 26.0 - 34.0 pg   MCHC 31.8 30.0 - 36.0 g/dL   RDW 13.9 11.5 - 15.5 %   Platelets 274 150 - 400 K/uL   nRBC 0.0 0.0 - 0.2 %   Neutrophils Relative % 65 %   Neutro Abs 6.1 1.7 - 7.7 K/uL   Lymphocytes Relative 25 %   Lymphs Abs 2.4 0.7 - 4.0 K/uL   Monocytes Relative 7 %   Monocytes Absolute 0.7 0.1 - 1.0 K/uL   Eosinophils Relative 2 %   Eosinophils Absolute 0.2  0.0 - 0.5 K/uL   Basophils Relative 1 %   Basophils Absolute 0.1 0.0 - 0.1 K/uL   Immature Granulocytes 0 %   Abs Immature Granulocytes 0.02 0.00 - 0.07 K/uL    Comment: Performed at [facility], [address], [place], [place] [address]  Comprehensive  metabolic panel     Status: Abnormal   Collection Time: 11/19/19  5:10 AM  Result Value Ref Range   Sodium 151 (H) 135 - 145 mmol/L   Potassium 3.7 3.5 - 5.1 mmol/L   Chloride 116 (H) 98 - 111 mmol/L   CO2 27 22 - 32 mmol/L   Glucose, Bld 102 (H) 70 - 99 mg/dL   BUN 7 6 - 20 mg/dL   Creatinine, Ser 1.17 (H) 0.44 - 1.00 mg/dL   Calcium 11.1 (H) 8.9 - 10.3 mg/dL   Total Protein 6.7 6.5 - 8.1 g/dL   Albumin 3.7 3.5 - 5.0 g/dL   AST 14 (L) 15 - 41 U/L   ALT 20 0 - 44 U/L   Alkaline Phosphatase 105 38 - 126 U/L   Total Bilirubin 0.8 0.3 - 1.2 mg/dL   GFR calc non Af Amer 52 (L) >60 mL/min   GFR calc Af Amer >60 >60 mL/min   Anion gap 8 5 - 15    Comment: Performed at [facility], [place] [address] [place], [place] [address]  Lithium level     Status: None   Collection Time: 11/19/19  5:10 AM  Result Value Ref Range   Lithium Lvl 0.65 0.60 - 1.20 mmol/L    Comment: Performed at [facility] [address] [place], [place] [id number]  Basic metabolic panel     Status: Abnormal   Collection Time: 11/20/19  4:30 AM  Result Value Ref Range   Sodium 143 135 - 145 mmol/L   Potassium 3.4 (L) 3.5 - 5.1 mmol/L   Chloride 104 98 - 111 mmol/L   CO2 28 22 - 32 mmol/L   Glucose, Bld 110 (H) 70 - 99 mg/dL   BUN 7 6 - 20 mg/dL   Creatinine, Ser 1.20 (H) 0.44 - 1.00 mg/dL   Calcium 10.4 (H) 8.9 - 10.3 mg/dL   GFR calc non Af Amer 51 (L) >60 mL/min   GFR calc Af Amer 59 (L) >60 mL/min   Anion gap 11 5 - 15    Comment: Performed at [facility] [address], [place], [place] [address]    Current Facility-Administered Medications  Medication Dose Route Frequency Provider Last Rate Last Admin  . acetaminophen  (TYLENOL) tablet 650 mg  650 mg Oral Q6H PRN Wynetta Fines T, MD       Or  . acetaminophen (TYLENOL) suppository 650 mg  650 mg Rectal Q6H PRN Wynetta Fines T, MD      . amLODipine (NORVASC) tablet 5 mg  5 mg Oral Daily Rosita Fire, MD   5 mg at 11/20/19 0910  . benztropine (COGENTIN) tablet 2 mg  2 mg Oral BID Wynetta Fines T, MD   2 mg at 11/20/19 [id number]  . carvedilol (COREG) tablet 25 mg  25 mg Oral BID WC Wynetta Fines T, MD   25 mg at 11/20/19 0910  . cefTRIAXone (ROCEPHIN) 1 g in sodium chloride 0.9 % 100 mL IVPB  1 g Intravenous Q24H Swayze, Ava, DO 200 mL/hr at 11/19/19 1507 1 g at 11/19/19 1507  . dextrose 5 % solution   Intravenous Continuous Cristal Ford, DO 75 mL/hr at 11/20/19 1239 New Bag at 11/20/19 1239  . haloperidol (HALDOL) tablet 20 mg  20 mg Oral QHS Wynetta Fines T, MD   20 mg at 11/18/19 2149  . heparin injection 5,000 Units  5,000 Units Subcutaneous Q8H Zhang, Ping T,  MD   5,000 Units at 11/19/19 2201  . lamoTRIgine (LAMICTAL) tablet 50 mg  50 mg Oral BID Wynetta Fines T, MD   50 mg at 11/20/19 [id number]  . sodium chloride flush (NS) 0.9 % injection 10-40 mL  10-40 mL Intracatheter Q12H Swayze, Ava, DO      . sodium chloride flush (NS) 0.9 % injection 10-40 mL  10-40 mL Intracatheter PRN Swayze, Ava, DO      . temazepam (RESTORIL) capsule 30 mg  30 mg Oral QHS Lequita Halt, MD   30 mg at 11/18/19 2148    Musculoskeletal: Strength & Muscle Tone: Unable to assess Gait & Station: Unable to assess Patient leans: Unable to assess  Psychiatric Specialty Exam: Physical Exam  Nursing note and vitals reviewed. Constitutional: She appears well-developed.  HENT:  Head: Normocephalic.  Cardiovascular: Normal rate.  Respiratory: Effort normal.  Neurological: She is alert.  Psychiatric: She has a normal mood and affect. Her speech is normal. Thought content normal. She is slowed. Cognition and memory are impaired.    Review of Systems  Blood pressure (!) 158/94, pulse 61,  temperature 99.1 F (37.3 C), temperature source Oral, resp. rate 16, height 5\' 1"  (1.549 m), weight 64 kg, SpO2 98 %.Body mass index is 26.66 kg/m.  General Appearance: Casual  Eye Contact:  minimal  Speech:  Clear and Coherent  Volume:  Normal  Mood:  Euthymic  Affect:  Appropriate  Thought Process:  Coherent, Goal Directed and Descriptions of Associations: Intact  Orientation:  Other:  Self only  Thought Content:  Logical  Suicidal Thoughts:  No  Homicidal Thoughts:  No  Memory:  Immediate;   Fair  Judgement:  Impaired  Insight:  Lacking  Psychomotor Activity:  Normal  Concentration:  Concentration: Fair and Attention Span: Fair  Recall:  [employer] of Knowledge:  Good  Language:  Good  Akathisia:  Yes  Handed:  Right  AIMS (if indicated):     Assets:  Communication Skills Desire for Improvement Financial Resources/Insurance Housing Social Support Talents/Skills  ADL's:  Intact  Cognition:  Impaired,  Mild  Sleep:        Treatment Plan Summary: Daily contact with patient to assess and evaluate symptoms and progress in treatment  Recommend continue to hold the lithium.  Recommend continue treatment with Haldol and Lamictal.   Disposition: No evidence of imminent risk to self or others at present.    Emmaline Kluver, FNP 11/20/2019 12:58 PM   Attest to NP Note

## 2019-11-20 NOTE — Progress Notes (Signed)
PROGRESS NOTE    Paula Dickson  [id number] DOB: 02/01/1966 DOA: 11/17/2019 PCP: Sandi Mariscal, MD   Brief Narrative:  HPI On 11/17/2019 by Dr. Wynetta Fines Paula Dickson Paula Dickson is a 53 y.o. female with medical history significant of hypertension, schizophrenia, bipolar affective disorder, anxiety depression, on chronic lithium treatment, presented to the ER for generalized weakness associated with tremors and confusion was sent by her sister for evaluation.  Patient was completed 1 course of 6-day Keflex for UTI yesterday.   Patient sister chorionitis for patient became confused and has new onset of tremors and abnormal speech.   Patient denied any pain, fever chills no cough no dysuria no diarrhea.  Interim history Patient admitted with lithium toxicity and hyponatremia.  Nephrology was consulted.  Currently on IV fluids. Assessment & Plan   Acute metabolic and toxic encephalopathy -Suspect multifactorial including lithium toxicity and UTI -Patient currently alert and oriented to self and place -Continue to monitor  Possible UTI -Patient unable to voice his symptoms of dysuria -UA rare bacteria, 6-10 WBC, large leukocytes -urine culture ony showed 10K GNR -Blood culture show no growth -Currently on ceftriaxone  Lithium toxicity with acute renal insufficiency -Lithium level on admission was 1.39 -Lithium has been held, however level still within normal limits -Creatinine on admission was 1.19, currently 1.20 (upon review of patient's chart, her creatinine has ranged from  -Nephrology was consulted and appreciated and is since signed off  Bipolar affective disorder with anxiety and depression -As above lithium held -Continue haldol, lamictal -Psychiatry consulted and apprecatied  Hypernatremia -Placed on D5 water, sodium improved from 151 to 143 -Continue to monitor BMP  Dehydration -Continue IV fluids  Vomiting -overnight, patient had episodes of vomiting  partially digested food -will consult speech therapy -currently denies abdominal pain or nausea  Essential hypertension -continue amlodipine, coreg  Dystonia -Continue congentin  Hypokalemia -potassium 3.4, will replace and continue to monitor BMP  DVT Prophylaxis  heparin  Code Status: Full  Family Communication: None at bedside  Disposition Plan: Admitted. Pending improvement. Dispo TBD  Consultants Nephrology  Procedures  None  Antibiotics   Anti-infectives (From admission, onward)   Start     Dose/Rate Route Frequency Ordered Stop   11/18/19 1445  cefTRIAXone (ROCEPHIN) 1 g in sodium chloride 0.9 % 100 mL IVPB     1 g 200 mL/hr over 30 Minutes Intravenous Every 24 hours 11/18/19 1431        Subjective:   Paula Dickson seen and examined today.  Patient with no complaints today.  Denies any abdominal pain, nausea or further vomiting since last night.  Denies chest pain or shortness of breath, dizziness or headache.  Objective:   Vitals:   11/19/19 1500 11/19/19 1714 11/19/19 2102 11/20/19 0425  BP:  (!) 145/90 125/79 (!) 158/96  Pulse:  66 (!) 59 67  Resp:  18 16 16   Temp:  98.2 F (36.8 C) 98.9 F (37.2 C) 98.6 F (37 C)  TempSrc:  Oral Oral Oral  SpO2:  100% 98% 100%  Weight:   64 kg   Height: 5\' 1"  (1.549 m)       Intake/Output Summary (Last 24 hours) at 11/20/2019 0834 Last data filed at 11/20/2019 0600 Gross per 24 hour  Intake 2381.12 ml  Output 1300 ml  Net 1081.12 ml   Filed Weights   11/17/19 2224 11/18/19 2224 11/19/19 2102  Weight: 66.2 kg 68.2 kg 64 kg   Exam  General: Well developed, well  nourished, NAD, appears stated age  61: NCAT, mucous membranes moist.   Cardiovascular: S1 S2 auscultated, RRR  Respiratory: Clear to auscultation bilaterally with equal chest rise  Abdomen: Soft, nontender, nondistended, + bowel sounds  Extremities: warm dry without cyanosis clubbing or edema  Neuro: AAOx2 (self, place), tremor,  otherwise nonfocal  Psych: Confused however appropriate  Data Reviewed: I have personally reviewed following labs and imaging studies  CBC: Recent Labs  Lab 11/17/19 1249 11/19/19 0510  WBC 9.9 9.3  NEUTROABS 7.4 6.1  HGB 14.4 12.7  HCT 45.4 39.9  MCV 89.9 89.5  PLT 271 123456   Basic Metabolic Panel: Recent Labs  Lab 11/17/19 1245 11/18/19 0433 11/19/19 0510 11/20/19 0430  NA 146* 147* 151* 143  K 3.9 4.1 3.7 3.4*  CL 111 112* 116* 104  CO2 26 23 27 28   GLUCOSE 113* 102* 102* 110*  BUN 5* 6 7 7   CREATININE 1.19* 1.02* 1.17* 1.20*  CALCIUM 11.0* 10.6* 11.1* 10.4*  PHOS  --  3.4  --   --    GFR: Estimated Creatinine Clearance: 45.4 mL/min (A) (by C-G formula based on SCr of 1.2 mg/dL (H)). Liver Function Tests: Recent Labs  Lab 11/17/19 1245 11/18/19 0433 11/19/19 0510  AST 17  --  14*  ALT 22  --  20  ALKPHOS 117  --  105  BILITOT 0.4  --  0.8  PROT 7.0  --  6.7  ALBUMIN 4.0 3.7 3.7   No results for input(s): LIPASE, AMYLASE in the last 168 hours. Recent Labs  Lab 11/17/19 1245  AMMONIA 19   Coagulation Profile: No results for input(s): INR, PROTIME in the last 168 hours. Cardiac Enzymes: No results for input(s): CKTOTAL, CKMB, CKMBINDEX, TROPONINI in the last 168 hours. BNP (last 3 results) No results for input(s): PROBNP in the last 8760 hours. HbA1C: No results for input(s): HGBA1C in the last 72 hours. CBG: Recent Labs  Lab 11/17/19 2113  GLUCAP 100*   Lipid Profile: No results for input(s): CHOL, HDL, LDLCALC, TRIG, CHOLHDL, LDLDIRECT in the last 72 hours. Thyroid Function Tests: Recent Labs    11/18/19 0433  TSH 0.651   Anemia Panel: No results for input(s): VITAMINB12, FOLATE, FERRITIN, TIBC, IRON, RETICCTPCT in the last 72 hours. Urine analysis:    Component Value Date/Time   COLORURINE YELLOW 11/17/2019 [place] 11/17/2019 1541   LABSPEC 1.005 11/17/2019 1541   PHURINE 7.0 11/17/2019 1541   GLUCOSEU NEGATIVE  11/17/2019 1541   HGBUR NEGATIVE 11/17/2019 1541   BILIRUBINUR NEGATIVE 11/17/2019 1541   BILIRUBINUR neg 12/27/2015 1421   KETONESUR NEGATIVE 11/17/2019 1541   PROTEINUR NEGATIVE 11/17/2019 1541   UROBILINOGEN 0.2 12/27/2015 1421   UROBILINOGEN 0.2 06/14/2015 0040   NITRITE NEGATIVE 11/17/2019 1541   LEUKOCYTESUR LARGE (A) 11/17/2019 1541   Sepsis Labs: @LABRCNTIP (procalcitonin:4,lacticidven:4)  ) Recent Results (from the past 240 hour(s))  SARS CORONAVIRUS 2 (TAT 6-24 HRS) Nasopharyngeal Nasopharyngeal Swab     Status: None   Collection Time: 11/17/19  3:26 PM   Specimen: Nasopharyngeal Swab  Result Value Ref Range Status   SARS Coronavirus 2 NEGATIVE NEGATIVE Final    Comment: (NOTE) SARS-CoV-2 target nucleic acids are NOT DETECTED. The SARS-CoV-2 RNA is generally detectable in upper and lower respiratory specimens during the acute phase of infection. Negative results do not preclude SARS-CoV-2 infection, do not rule out co-infections with other pathogens, and should not be used as the sole basis for treatment or  other patient management decisions. Negative results must be combined with clinical observations, patient history, and epidemiological information. The expected result is Negative. Fact Sheet for Patients: SugarRoll.be Fact Sheet for Healthcare Providers: https://www.woods-mathews.com/ This test is not yet approved or cleared by the [place] FDA and  has been authorized for detection and/or diagnosis of SARS-CoV-2 by FDA under an Emergency Use Authorization (EUA). This EUA will remain  in effect (meaning this test can be used) for the duration of the COVID-19 declaration under Section 56 4(b)(1) of the Act, 21 U.S.C. section 360bbb-3(b)(1), unless the authorization is terminated or revoked sooner. Performed at [facility], [place] [address] [place], [place] [address]   Urine culture     Status: Abnormal  (Preliminary result)   Collection Time: 11/17/19  6:41 PM   Specimen: Urine, Clean Catch  Result Value Ref Range Status   Specimen Description URINE, CLEAN CATCH  Final   Special Requests NONE  Final   Culture (A)  Final    10,000 COLONIES/mL GRAM NEGATIVE RODS REPEATING  IDENTIFICATION AND SUSCEPTIBILITIES TO FOLLOW Performed at [facility], [place] [address], [place], [place] [address]    Report Status PENDING  Incomplete  Culture, blood (routine x 2)     Status: None (Preliminary result)   Collection Time: 11/18/19  2:57 PM   Specimen: BLOOD LEFT HAND  Result Value Ref Range Status   Specimen Description BLOOD LEFT HAND  Final   Special Requests   Final    BOTTLES DRAWN AEROBIC AND ANAEROBIC Blood Culture adequate volume   Culture   Final    NO GROWTH 2 DAYS Performed at [facility], [place] [address], [place], [place] [address]    Report Status PENDING  Incomplete  Culture, blood (routine x 2)     Status: None (Preliminary result)   Collection Time: 11/18/19  3:04 PM   Specimen: BLOOD RIGHT HAND  Result Value Ref Range Status   Specimen Description BLOOD RIGHT HAND  Final   Special Requests   Final    BOTTLES DRAWN AEROBIC AND ANAEROBIC Blood Culture adequate volume   Culture   Final    NO GROWTH 2 DAYS Performed at [facility], [place] [address] [place], [place] [address]    Report Status PENDING  Incomplete      Radiology Studies: No results found.   Scheduled Meds: . amLODipine  5 mg Oral Daily  . benztropine  2 mg Oral BID  . carvedilol  25 mg Oral BID WC  . haloperidol  20 mg Oral QHS  . heparin  5,000 Units Subcutaneous Q8H  . lamoTRIgine  50 mg Oral BID  . potassium chloride  40 mEq Oral Once  . sodium chloride flush  10-40 mL Intracatheter Q12H  . temazepam  30 mg Oral QHS   Continuous Infusions: . cefTRIAXone (ROCEPHIN)  IV 1 g (11/19/19 1507)  . dextrose 75 mL/hr at 11/19/19 0743     LOS: 3 days   Time Spent in minutes   45  minutes  Vashon Arch D.O. on 11/20/2019 at 8:34 AM  Between 7am to 7pm - Please see pager noted on [email or web address]  After 7pm go to [email or web address]  And look for the night coverage person covering for me after hours  [facility] Office  [phone]

## 2019-11-20 NOTE — Care Management Important Message (Signed)
Important Message  Patient Details  Name: Paula Dickson MRN: [id number] Date of Birth: May 27, 1966   Medicare Important Message Given:  Yes     Aryelle Figg Montine Circle 11/20/2019, 11:25 AM

## 2019-11-21 DIAGNOSIS — R1111 Vomiting without nausea: Secondary | ICD-10-CM

## 2019-11-21 DIAGNOSIS — E876 Hypokalemia: Secondary | ICD-10-CM

## 2019-11-21 DIAGNOSIS — F319 Bipolar disorder, unspecified: Secondary | ICD-10-CM

## 2019-11-21 LAB — BASIC METABOLIC PANEL
Anion gap: 10 (ref 5–15)
BUN: 6 mg/dL (ref 6–20)
CO2: 28 mmol/L (ref 22–32)
Calcium: 10.2 mg/dL (ref 8.9–10.3)
Chloride: 103 mmol/L (ref 98–111)
Creatinine, Ser: 1.16 mg/dL — ABNORMAL HIGH (ref 0.44–1.00)
GFR calc Af Amer: >60 mL/min (ref >60)
GFR calc non Af Amer: 53 mL/min — ABNORMAL LOW (ref >60)
Glucose, Bld: 107 mg/dL — ABNORMAL HIGH (ref 70–99)
Potassium: 3.7 mmol/L (ref 3.5–5.1)
Sodium: 141 mmol/L (ref 135–145)

## 2019-11-21 MED ORDER — POTASSIUM CHLORIDE CRYS ER 20 MEQ PO TBCR
40.0000 meq | EXTENDED_RELEASE_TABLET | Freq: Once | ORAL | Status: AC
  Administered 2019-11-21: 40 meq via ORAL
  Filled 2019-11-21: qty 2

## 2019-11-21 NOTE — Plan of Care (Signed)
  Problem: Urinary Elimination: Goal: Signs and symptoms of infection will decrease Outcome: Progressing   Problem: Safety: Goal: Ability to remain free from injury will improve Outcome: Progressing

## 2019-11-21 NOTE — Progress Notes (Signed)
PROGRESS NOTE    Leaya Mccorkle Storr  [id number] DOB: 11-30-1965 DOA: 11/17/2019 PCP: Sandi Mariscal, MD   Brief Narrative:  HPI On 11/17/2019 by Dr. Wynetta Fines Smt Stidam Merwin is a 53 y.o. female with medical history significant of hypertension, schizophrenia, bipolar affective disorder, anxiety depression, on chronic lithium treatment, presented to the ER for generalized weakness associated with tremors and confusion was sent by her sister for evaluation.  Patient was completed 1 course of 6-day Keflex for UTI yesterday.   Patient sister chorionitis for patient became confused and has new onset of tremors and abnormal speech.   Patient denied any pain, fever chills no cough no dysuria no diarrhea.  Interim history Patient admitted with lithium toxicity and hyponatremia.  Nephrology was consulted.  Currently on IV fluids. Assessment & Plan   Acute metabolic and toxic encephalopathy -Suspect multifactorial including lithium toxicity and UTI -Patient currently alert and oriented to self and place -Continue to monitor  Possible UTI -Patient unable to voice his symptoms of dysuria -UA rare bacteria, 6-10 WBC, large leukocytes -urine culture ony showed 10K GNR -Blood culture show no growth -Currently on ceftriaxone  Lithium toxicity with acute renal insufficiency -Lithium level on admission was 1.39 -Lithium has been held, however level still within normal limits -Creatinine on admission was 1.19, currently 1.16 (upon review of patient's chart, her creatinine has ranged from 0.9-1.2 -Nephrology was consulted and appreciated and is since signed off  Bipolar affective disorder with anxiety and depression -As above lithium held -Continue haldol, lamictal -Psychiatry consulted and appreciated and recommended to continue to hold lithium. Continue haldol and lamictal   Hypernatremia -Placed on D5 water, sodium improved from 151 to 141 -Continue to monitor  BMP  Dehydration -Continue IV fluids  Vomiting -overnight 12/23-24, patient had episodes of vomiting partially digested food -Pending speech therapy eval -Denies nausea, abdominal pain at this time  Essential hypertension -continue amlodipine, coreg  Dystonia -Continue congentin  Hypokalemia -potassium 3.7 -will continue to supplement potassium and monitor BMP  Deconditioning -PT consulted, recommended SNF with rolling walker  DVT Prophylaxis  heparin  Code Status: Full  Family Communication: None at bedside  Disposition Plan: Admitted. Pending improvement. Dispo TBD  Consultants Nephrology  Procedures  None  Antibiotics   Anti-infectives (From admission, onward)   Start     Dose/Rate Route Frequency Ordered Stop   11/18/19 1445  cefTRIAXone (ROCEPHIN) 1 g in sodium chloride 0.9 % 100 mL IVPB     1 g 200 mL/hr over 30 Minutes Intravenous Every 24 hours 11/18/19 1431        Subjective:   [name] Bogucki seen and examined today.  Patient with no complaints this morning.  Denies abdominal pain or nausea.  Denies chest pain or shortness of breath, dizziness or headache.  Objective:   Vitals:   11/20/19 1029 11/20/19 1714 11/20/19 2300 11/21/19 0925  BP:  (!) 158/86 139/89 (!) 153/91  Pulse:  68 61 (!) 58  Resp:  18 16 18   Temp: 99.1 F (37.3 C)  98.8 F (37.1 C) 98.9 F (37.2 C)  TempSrc: Oral  Oral   SpO2:  99% 99% 100%  Weight:      Height:        Intake/Output Summary (Last 24 hours) at 11/21/2019 0930 Last data filed at 11/21/2019 0700 Gross per 24 hour  Intake 2186.1 ml  Output 2400 ml  Net -213.9 ml   Filed Weights   11/17/19 2224 11/18/19 2224 11/19/19 2102  Weight: 66.2 kg 68.2 kg 64 kg   Exam  General: Well developed, well nourished, NAD, appears stated age  65: NCAT, mucous membranes moist.   Cardiovascular: S1 S2 auscultated, RRR  Respiratory: Clear to auscultation bilaterally with equal chest rise  Abdomen: Soft,  nontender, nondistended, + bowel sounds  Extremities: warm dry without cyanosis clubbing or edema  Neuro: AAOx1 (self only), tremor, otherwise nonfocal  Psych: Confused but appropriate   Data Reviewed: I have personally reviewed following labs and imaging studies  CBC: Recent Labs  Lab 11/17/19 1249 11/19/19 0510  WBC 9.9 9.3  NEUTROABS 7.4 6.1  HGB 14.4 12.7  HCT 45.4 39.9  MCV 89.9 89.5  PLT 271 123456   Basic Metabolic Panel: Recent Labs  Lab 11/17/19 1245 11/18/19 0433 11/19/19 0510 11/20/19 0430 11/21/19 0423  NA 146* 147* 151* 143 141  K 3.9 4.1 3.7 3.4* 3.7  CL 111 112* 116* 104 103  CO2 26 23 27 28 28   GLUCOSE 113* 102* 102* 110* 107*  BUN 5* 6 7 7 6   CREATININE 1.19* 1.02* 1.17* 1.20* 1.16*  CALCIUM 11.0* 10.6* 11.1* 10.4* 10.2  PHOS  --  3.4  --   --   --    GFR: Estimated Creatinine Clearance: 47 mL/min (A) (by C-G formula based on SCr of 1.16 mg/dL (H)). Liver Function Tests: Recent Labs  Lab 11/17/19 1245 11/18/19 0433 11/19/19 0510  AST 17  --  14*  ALT 22  --  20  ALKPHOS 117  --  105  BILITOT 0.4  --  0.8  PROT 7.0  --  6.7  ALBUMIN 4.0 3.7 3.7   No results for input(s): LIPASE, AMYLASE in the last 168 hours. Recent Labs  Lab 11/17/19 1245  AMMONIA 19   Coagulation Profile: No results for input(s): INR, PROTIME in the last 168 hours. Cardiac Enzymes: No results for input(s): CKTOTAL, CKMB, CKMBINDEX, TROPONINI in the last 168 hours. BNP (last 3 results) No results for input(s): PROBNP in the last 8760 hours. HbA1C: No results for input(s): HGBA1C in the last 72 hours. CBG: Recent Labs  Lab 11/17/19 2113  GLUCAP 100*   Lipid Profile: No results for input(s): CHOL, HDL, LDLCALC, TRIG, CHOLHDL, LDLDIRECT in the last 72 hours. Thyroid Function Tests: No results for input(s): TSH, T4TOTAL, FREET4, T3FREE, THYROIDAB in the last 72 hours. Anemia Panel: No results for input(s): VITAMINB12, FOLATE, FERRITIN, TIBC, IRON, RETICCTPCT in  the last 72 hours. Urine analysis:    Component Value Date/Time   COLORURINE YELLOW 11/17/2019 [place] 11/17/2019 1541   LABSPEC 1.005 11/17/2019 1541   PHURINE 7.0 11/17/2019 1541   GLUCOSEU NEGATIVE 11/17/2019 1541   HGBUR NEGATIVE 11/17/2019 1541   BILIRUBINUR NEGATIVE 11/17/2019 1541   BILIRUBINUR neg 12/27/2015 1421   KETONESUR NEGATIVE 11/17/2019 1541   PROTEINUR NEGATIVE 11/17/2019 1541   UROBILINOGEN 0.2 12/27/2015 1421   UROBILINOGEN 0.2 06/14/2015 0040   NITRITE NEGATIVE 11/17/2019 1541   LEUKOCYTESUR LARGE (A) 11/17/2019 1541   Sepsis Labs: @LABRCNTIP (procalcitonin:4,lacticidven:4)  ) Recent Results (from the past 240 hour(s))  SARS CORONAVIRUS 2 (TAT 6-24 HRS) Nasopharyngeal Nasopharyngeal Swab     Status: None   Collection Time: 11/17/19  3:26 PM   Specimen: Nasopharyngeal Swab  Result Value Ref Range Status   SARS Coronavirus 2 NEGATIVE NEGATIVE Final    Comment: (NOTE) SARS-CoV-2 target nucleic acids are NOT DETECTED. The SARS-CoV-2 RNA is generally detectable in upper and lower respiratory specimens during the  acute phase of infection. Negative results do not preclude SARS-CoV-2 infection, do not rule out co-infections with other pathogens, and should not be used as the sole basis for treatment or other patient management decisions. Negative results must be combined with clinical observations, patient history, and epidemiological information. The expected result is Negative. Fact Sheet for Patients: SugarRoll.be Fact Sheet for Healthcare Providers: https://www.woods-mathews.com/ This test is not yet approved or cleared by the [place] FDA and  has been authorized for detection and/or diagnosis of SARS-CoV-2 by FDA under an Emergency Use Authorization (EUA). This EUA will remain  in effect (meaning this test can be used) for the duration of the COVID-19 declaration under Section 56 4(b)(1) of  the Act, 21 U.S.C. section 360bbb-3(b)(1), unless the authorization is terminated or revoked sooner. Performed at [facility], [place] [address], [place], [place] [address]   Urine culture     Status: Abnormal (Preliminary result)   Collection Time: 11/17/19  6:41 PM   Specimen: Urine, Clean Catch  Result Value Ref Range Status   Specimen Description URINE, CLEAN CATCH  Final   Special Requests NONE  Final   Culture (A)  Final    10,000 COLONIES/mL GRAM NEGATIVE RODS REPEATING  IDENTIFICATION AND SUSCEPTIBILITIES TO FOLLOW Performed at [facility], [place] [address] [place], [place] [address]    Report Status PENDING  Incomplete  Culture, blood (routine x 2)     Status: None (Preliminary result)   Collection Time: 11/18/19  2:57 PM   Specimen: BLOOD LEFT HAND  Result Value Ref Range Status   Specimen Description BLOOD LEFT HAND  Final   Special Requests   Final    BOTTLES DRAWN AEROBIC AND ANAEROBIC Blood Culture adequate volume   Culture   Final    NO GROWTH 3 DAYS Performed at [facility], [place] [address] [place], [place] [address]    Report Status PENDING  Incomplete  Culture, blood (routine x 2)     Status: None (Preliminary result)   Collection Time: 11/18/19  3:04 PM   Specimen: BLOOD RIGHT HAND  Result Value Ref Range Status   Specimen Description BLOOD RIGHT HAND  Final   Special Requests   Final    BOTTLES DRAWN AEROBIC AND ANAEROBIC Blood Culture adequate volume   Culture   Final    NO GROWTH 3 DAYS Performed at [facility], [place] [address] [place], [place] [address]    Report Status PENDING  Incomplete      Radiology Studies: No results found.   Scheduled Meds: . amLODipine  5 mg Oral Daily  . benztropine  2 mg Oral BID  . carvedilol  25 mg Oral BID WC  . haloperidol  20 mg Oral QHS  . heparin  5,000 Units Subcutaneous Q8H  . lamoTRIgine  50 mg Oral BID  . sodium chloride flush  10-40 mL Intracatheter Q12H  . temazepam  30 mg  Oral QHS   Continuous Infusions: . cefTRIAXone (ROCEPHIN)  IV Stopped (11/20/19 1550)  . dextrose 75 mL/hr at 11/21/19 0700     LOS: 4 days   Time Spent in minutes   30 minutes  Hasnain Manheim D.O. on 11/21/2019 at 9:30 AM  Between 7am to 7pm - Please see pager noted on [email or web address]  After 7pm go to [email or web address]  And look for the night coverage person covering for me after hours  [facility] Office  [phone]

## 2019-11-21 NOTE — Evaluation (Signed)
Clinical/Bedside Swallow Evaluation Patient Details  Name: Paula Dickson MRN: [id number] Date of Birth: 04-Jan-1966  Today's Date: 11/21/2019 Time: SLP Start Time (ACUTE ONLY): 1046 SLP Stop Time (ACUTE ONLY): 1056 SLP Time Calculation (min) (ACUTE ONLY): 10 min  Past Medical History:  Past Medical History:  Diagnosis Date  . Bipolar affective disorder ([place])   . History of arthritis   . History of chicken pox   . History of depression   . History of genital warts   . history of heart murmur   . History of high blood pressure   . History of thyroid disease   . History of UTI   . Hypertension   . Low TSH level 07/13/2017  . Schizophrenia [facility])    Past Surgical History:  Past Surgical History:  Procedure Laterality Date  . ABLATION ON ENDOMETRIOSIS    . CYST REMOVAL NECK     around 11 years ago /benign  . MULTIPLE TOOTH EXTRACTIONS     HPI:  54 y.o. female with medical history significant for hypertension, schizophrenia, bipolar affective disorder, anxiety depression, on chronic lithium treatment, presented to the ER 12/21 for generalized weakness associated with tremors and confusion. Dx acute metabolic and toxic encephalopathy (multifactorial including lithium toxicity and UTI), acute renal insufficiency, hypernatremia.  Lung sounds are CTA bilaterally at time of eval.     Assessment / Plan / Recommendation Clinical Impression  Pt presents with normal oropharyngeal swallow with adequate mastication, brisk swallow, no s/s of aspiration.  She was drowsy, but not dysphagic.  Continue regular diet, thin liquids when pt is awake enough to eat. No SLP f/u needed.  SLP Visit Diagnosis: Dysphagia, unspecified (R13.10)    Aspiration Risk  No limitations    Diet Recommendation   regular solids, thin liquids  Medication Administration: Whole meds with liquid    Other  Recommendations Oral Care Recommendations: Oral care BID   Follow up Recommendations None      Frequency  and Duration            Prognosis        Swallow Study   General Date of Onset: 11/17/19 HPI: 53 y.o. female with medical history significant for hypertension, schizophrenia, bipolar affective disorder, anxiety depression, on chronic lithium treatment, presented to the ER 12/21 for generalized weakness associated with tremors and confusion. Dx acute metabolic and toxic encephalopathy (multifactorial including lithium toxicity and UTI), acute renal insufficiency, hypernatremia.  Lung sounds are CTA bilaterally at time of eval.   Type of Study: Bedside Swallow Evaluation Previous Swallow Assessment: no Diet Prior to this Study: Regular;Thin liquids Temperature Spikes Noted: No Respiratory Status: Room air History of Recent Intubation: No Behavior/Cognition: Lethargic/Drowsy Oral Cavity Assessment: Within Functional Limits Oral Care Completed by SLP: No Oral Cavity - Dentition: Adequate natural dentition Vision: Functional for self-feeding Self-Feeding Abilities: Able to feed self Patient Positioning: Upright in bed Baseline Vocal Quality: Normal Volitional Cough: Strong Volitional Swallow: Able to elicit    Oral/Motor/Sensory Function Overall Oral Motor/Sensory Function: Within functional limits   Ice Chips Ice chips: Within functional limits   Thin Liquid Thin Liquid: Within functional limits    Nectar Thick Nectar Thick Liquid: Not tested   Honey Thick Honey Thick Liquid: Not tested   Puree Puree: Within functional limits   Solid     Solid: Within functional limits      Paula Dickson 11/21/2019,11:01 AM   Estill Bamberg L. Tivis Ringer, [place] Office number [phone]

## 2019-11-21 NOTE — Evaluation (Signed)
Occupational Therapy Evaluation Patient Details Name: Paula Dickson MRN: [id number] DOB: 1966/01/05 Today's Date: 11/21/2019    History of Present Illness 53yo female presenting with confusion, tremors, and weakness. Admitted with acute metabolic and toxic encephalopathy secondary to lithium toxicity and potential UTI. PMH schizophrenia, HTN, bipolar disorder, anxiety   Clinical Impression   This 53 yo female who reports she in normally independent, lives alone, and drives presents to acute with Min-Mod A for mobility and setup/s-total A for basic ADLs. She has decreased balance, decreased mobility, tremors in Bil UEs, and decreased safety awareness all affecting her ability to perform her basic ADLs as she was pta. Follow up therapies at SNF is recommended for patient to work back towards her PLOF.    Follow Up Recommendations  SNF;Supervision/Assistance - 24 hour    Equipment Recommendations  Other (comment)(TBD next venue)       Precautions / Restrictions Precautions Precautions: Fall Restrictions Weight Bearing Restrictions: No      Mobility Bed Mobility Overal bed mobility: Needs Assistance Bed Mobility: Supine to Sit;Sit to Supine     Supine to sit: Mod assist Sit to supine: Min assist   General bed mobility comments: Min A for legs and Mod A for trunk for OOB; min A for legs for back into bed  Transfers Overall transfer level: Needs assistance Equipment used: 1 person hand held assist Transfers: Sit to/from Stand Sit to Stand: Mod assist         General transfer comment: Mod A to lateral step up towards [facility]    Balance Overall balance assessment: Needs assistance Sitting-balance support: No upper extremity supported;Feet supported Sitting balance-Leahy Scale: Fair     Standing balance support: Bilateral upper extremity supported Standing balance-Leahy Scale: Poor                             ADL either performed or assessed with  clinical judgement   ADL Overall ADL's : Needs assistance/impaired Eating/Feeding: Minimal assistance;Sitting Eating/Feeding Details (indicate cue type and reason): due to tremors Grooming: Set up;Supervision/safety;Sitting;Wash/dry face   Upper Body Bathing: Supervision/ safety;Set up;Sitting   Lower Body Bathing: Moderate assistance Lower Body Bathing Details (indicate cue type and reason): Mod A sit<>stand Upper Body Dressing : Moderate assistance;Sitting   Lower Body Dressing: Total assistance Lower Body Dressing Details (indicate cue type and reason): Mod A sit<>stand Toilet Transfer: Moderate assistance Toilet Transfer Details (indicate cue type and reason): lateral step up towards [place] and Hygiene: Total assistance Toileting - Clothing Manipulation Details (indicate cue type and reason): Mod A sit<>stand             Vision Patient Visual Report: No change from baseline              Pertinent Vitals/Pain Pain Assessment: No/denies pain     Hand Dominance Right   Extremity/Trunk Assessment Upper Extremity Assessment Upper Extremity Assessment: Generalized weakness(intentional tremors)           Communication Communication Communication: No difficulties   Cognition Arousal/Alertness: (initially lethargic,but once sat up on EOB pt awake/alert) Behavior During Therapy: WFL for tasks assessed/performed Overall Cognitive Status: No family/caregiver present to determine baseline cognitive functioning Area of Impairment: Orientation;Following commands;Safety/judgement;Problem solving                 Orientation Level: Disoriented to;Place;Situation     Following Commands: Follows one step commands with increased time Safety/Judgement: Decreased awareness of  safety;Decreased awareness of deficits   Problem Solving: Requires verbal cues;Difficulty sequencing;Decreased initiation General Comments: New she was in hospital  but not which one              [place] expects to be discharged to:: Private residence Living Arrangements: Alone Available Help at Discharge: Family;Available PRN/intermittently Type of Home: House Home Access: Stairs to enter [employer] of Steps: 5 Entrance Stairs-Rails: Can reach both Home Layout: One level     Bathroom Shower/Tub: Teacher, early years/pre: Standard     Home Equipment: None   Additional Comments: patient provides this history but does have cognitive and memory deficits so unsure of accuracy      Prior Functioning/Environment Level of Independence: Independent        Comments: per family, when pt is well she drives and does own meds.  When she isn't doing well, she doesn't believe she can walk. (taken from prior charting). Pt reports to me she does drive        OT Problem List: Decreased strength;Impaired balance (sitting and/or standing);Decreased coordination      OT Treatment/Interventions: Self-care/ADL training;DME and/or AE instruction;Patient/family education;Balance training    OT Goals(Current goals can be found in the care plan section) Acute Rehab OT Goals Patient Stated Goal: to go home OT Goal Formulation: With patient Time For Goal Achievement: 12/05/19 Potential to Achieve Goals: Good  OT Frequency: Min 2X/week   Barriers to D/C: Decreased caregiver support             AM-PAC OT "6 Clicks" Daily Activity     Outcome Measure Help from another person eating meals?: A Little Help from another person taking care of personal grooming?: A Little Help from another person toileting, which includes using toliet, bedpan, or urinal?: A Lot Help from another person bathing (including washing, rinsing, drying)?: A Lot Help from another person to put on and taking off regular upper body clothing?: A Lot Help from another person to put on and taking off regular lower body clothing?: A Lot 6 Click  Score: 14   End of Session Equipment Utilized During Treatment: Gait belt  Activity Tolerance: Patient tolerated treatment well Patient left: in bed;with call bell/phone within reach;with bed alarm set  OT Visit Diagnosis: Unsteadiness on feet (R26.81);Other abnormalities of gait and mobility (R26.89);Muscle weakness (generalized) (M62.81)                Time: [id number] OT Time Calculation (min): 19 min Charges:  OT General Charges $OT Visit: 1 Visit OT Evaluation $OT Eval Moderate Complexity: 1 Mod  Cathy OTR/L Acute [employer] Pager [phone] Office [phone]     11/21/2019, 4:01 PM

## 2019-11-21 NOTE — Consult Note (Addendum)
Telepsych Consultation   Reason for Consult: Bipolar disorder/depression, lithium toxicity Referring Physician:  Dr Ree Kida Location of [facility]: [facility] [id number] Location of Provider: [facility]  Patient Identification: Sherisse Sudler Aller MRN:  [id number] Principal Diagnosis: Acute metabolic encephalopathy Diagnosis:  Principal Problem:   Acute metabolic encephalopathy Active Problems:   AMS (altered mental status)   Lithium toxicity   Total Time spent with patient: 30 minutes  Subjective:   Shaunelle Brys Lyster is a 53 y.o. female patient admitted with metabolic encephalopathy.  Patient assessed by nurse practitioner.  Patient more alert and oriented x4 today, answers appropriately.  Patient denies suicidal and homicidal ideations.  Patient denies symptoms of paranoia.  Patient denies auditory and visual hallucinations.  Patient reports she follows up with [facility] for outpatient psychiatric needs.  Patient followed by Dr. Rojelio Brenner for primary care. Patient reports living alone.  Patient has a friend named Keedra who helps her with her medications and other needs.  Patient also reports sister who is supportive.  Patient denies access to weapons. Case discussed with Dr. Parke Poisson who agrees with disposition, cleared by psychiatry follow-up with established outpatient provider at [facility].  HPI: Patient admitted with acute metabolic encephalopathy.  Patient with increased lithium level at time of admission.  Past Psychiatric History: Bipolar disorder  Risk to Self:  Denies Risk to Others:  Denies Prior Inpatient Therapy:  Yes Prior Outpatient Therapy:  Yes  Past Medical History:  Past Medical History:  Diagnosis Date  . Bipolar affective disorder ([place])   . History of arthritis   . History of chicken pox   . History of depression   . History of genital warts   . history of heart murmur   . History of high blood pressure   . History of thyroid disease   . History of  UTI   . Hypertension   . Low TSH level 07/13/2017  . Schizophrenia [facility])     Past Surgical History:  Procedure Laterality Date  . ABLATION ON ENDOMETRIOSIS    . CYST REMOVAL NECK     around 11 years ago /benign  . MULTIPLE TOOTH EXTRACTIONS     Family History:  Family History  Problem Relation Age of Onset  . Arthritis Father   . Hyperlipidemia Father   . High blood pressure Father   . Diabetes Sister   . Diabetes Mother   . Diabetes Brother   . Mental illness Brother   . Alcohol abuse Paternal Uncle   . Alcohol abuse Paternal Grandfather   . Breast cancer Maternal Aunt   . Breast cancer Paternal Aunt   . High blood pressure Sister   . Mental illness Other        runs in family   Family Psychiatric  History: Denies Social History:  Social History   Substance and Sexual Activity  Alcohol Use No     Social History   Substance and Sexual Activity  Drug Use No    Social History   Socioeconomic History  . Marital status: Single    Spouse name: Not on file  . Number of children: Not on file  . Years of education: Not on file  . Highest education level: Not on file  Occupational History  . Not on file  Tobacco Use  . Smoking status: Never Smoker  . Smokeless tobacco: Never Used  Substance and Sexual Activity  . Alcohol use: No  . Drug use: No  . Sexual activity: Not Currently  Other Topics Concern  . Not on file  Social History Narrative  . Not on file   Social Determinants of Health   Financial Resource Strain:   . Difficulty of Paying Living Expenses: Not on file  Food Insecurity:   . Worried About Charity fundraiser in the Last Year: Not on file  . Ran Out of Food in the Last Year: Not on file  Transportation Needs:   . Lack of Transportation (Medical): Not on file  . Lack of Transportation (Non-Medical): Not on file  Physical Activity:   . Days of Exercise per Week: Not on file  . Minutes of Exercise per Session: Not on file  Stress:   .  Feeling of Stress : Not on file  Social Connections:   . Frequency of Communication with Friends and Family: Not on file  . Frequency of Social Gatherings with Friends and Family: Not on file  . Attends Religious Services: Not on file  . Active Member of Clubs or Organizations: Not on file  . Attends [name] Meetings: Not on file  . Marital Status: Not on file   Additional Social History:    Allergies:  No Known Allergies  Labs:  Results for orders placed or performed during the hospital encounter of 11/17/19 (from the past 48 hour(s))  Basic metabolic panel     Status: Abnormal   Collection Time: 11/20/19  4:30 AM  Result Value Ref Range   Sodium 143 135 - 145 mmol/L   Potassium 3.4 (L) 3.5 - 5.1 mmol/L   Chloride 104 98 - 111 mmol/L   CO2 28 22 - 32 mmol/L   Glucose, Bld 110 (H) 70 - 99 mg/dL   BUN 7 6 - 20 mg/dL   Creatinine, Ser 1.20 (H) 0.44 - 1.00 mg/dL   Calcium 10.4 (H) 8.9 - 10.3 mg/dL   GFR calc non Af Amer 51 (L) >60 mL/min   GFR calc Af Amer 59 (L) >60 mL/min   Anion gap 11 5 - 15    Comment: Performed at [facility], [address], [place], [place] [id number]  Basic metabolic panel     Status: Abnormal   Collection Time: 11/21/19  4:23 AM  Result Value Ref Range   Sodium 141 135 - 145 mmol/L   Potassium 3.7 3.5 - 5.1 mmol/L   Chloride 103 98 - 111 mmol/L   CO2 28 22 - 32 mmol/L   Glucose, Bld 107 (H) 70 - 99 mg/dL   BUN 6 6 - 20 mg/dL   Creatinine, Ser 1.16 (H) 0.44 - 1.00 mg/dL   Calcium 10.2 8.9 - 10.3 mg/dL   GFR calc non Af Amer 53 (L) >60 mL/min   GFR calc Af Amer >60 >60 mL/min   Anion gap 10 5 - 15    Comment: Performed at [facility], [place] [address], [place],  [address]    Medications:  Current Facility-Administered Medications  Medication Dose Route Frequency Provider Last Rate Last Admin  . acetaminophen (TYLENOL) tablet 650 mg  650 mg Oral Q6H PRN Wynetta Fines T, MD       Or  . acetaminophen (TYLENOL)  suppository 650 mg  650 mg Rectal Q6H PRN Wynetta Fines T, MD      . amLODipine (NORVASC) tablet 5 mg  5 mg Oral Daily Rosita Fire, MD   5 mg at 11/21/19 0854  . benztropine (COGENTIN) tablet 2 mg  2 mg  Oral BID Lequita Halt, MD   2 mg at 11/21/19 [id number]  . carvedilol (COREG) tablet 25 mg  25 mg Oral BID WC Lequita Halt, MD   25 mg at 11/21/19 0854  . cefTRIAXone (ROCEPHIN) 1 g in sodium chloride 0.9 % 100 mL IVPB  1 g Intravenous Q24H Swayze, Ava, DO   Stopped at 11/20/19 1550  . dextrose 5 % solution   Intravenous Continuous Cristal Ford, DO 75 mL/hr at 11/21/19 0700 Rate Verify at 11/21/19 0700  . haloperidol (HALDOL) tablet 20 mg  20 mg Oral QHS Wynetta Fines T, MD   20 mg at 11/20/19 2252  . heparin injection 5,000 Units  5,000 Units Subcutaneous Q8H Lequita Halt, MD   5,000 Units at 11/21/19 [phone]  . lamoTRIgine (LAMICTAL) tablet 50 mg  50 mg Oral BID Wynetta Fines T, MD   50 mg at 11/21/19 0855  . potassium chloride SA (KLOR-CON) CR tablet 40 mEq  40 mEq Oral Once Cristal Ford, DO      . sodium chloride flush (NS) 0.9 % injection 10-40 mL  10-40 mL Intracatheter Q12H Swayze, Ava, DO   10 mL at 11/21/19 0855  . sodium chloride flush (NS) 0.9 % injection 10-40 mL  10-40 mL Intracatheter PRN Swayze, Ava, DO   10 mL at 11/21/19 0428  . temazepam (RESTORIL) capsule 30 mg  30 mg Oral QHS Lequita Halt, MD   30 mg at 11/20/19 2204    Musculoskeletal: Strength & Muscle Tone: Unable to assess Gait & Station: Unable to assess Patient leans: Unable to assess  Psychiatric Specialty Exam: Physical Exam  Nursing note and vitals reviewed. Constitutional: She is oriented to person, place, and time. She appears well-developed.  HENT:  Head: Normocephalic.  Cardiovascular: Normal rate.  Respiratory: Effort normal.  Neurological: She is alert and oriented to person, place, and time.  Psychiatric: She has a normal mood and affect. Her behavior is normal. Judgment and thought content  normal.    Review of Systems  Constitutional: Negative.   HENT: Negative.   Eyes: Negative.   Respiratory: Negative.   Cardiovascular: Negative.   Gastrointestinal: Negative.   Genitourinary: Negative.   Musculoskeletal: Negative.   Skin: Negative.   Neurological: Negative.     Blood pressure (!) 153/91, pulse (!) 58, temperature 98.9 F (37.2 C), resp. rate 18, height 5\' 1"  (1.549 m), weight 64 kg, SpO2 100 %.Body mass index is 26.66 kg/m.  General Appearance: Casual and Fairly Groomed  Eye Contact:  Good  Speech:  Clear and Coherent and Normal Rate  Volume:  Normal  Mood:  Euthymic  Affect:  Appropriate and Congruent  Thought Process:  Coherent, Goal Directed and Descriptions of Associations: Intact  Orientation:  Full (Time, Place, and Person)  Thought Content:  WDL and Logical  Suicidal Thoughts:  No  Homicidal Thoughts:  No  Memory:  Immediate;   Good Recent;   Good Remote;   Good  Judgement:  Good  Insight:  Good  Psychomotor Activity:  Normal  Concentration:  Concentration: Good and Attention Span: Good  Recall:  Good  Fund of Knowledge:  Good  Language:  Good  Akathisia:  No  Handed:  Right  AIMS (if indicated):     Assets:  Communication Skills Desire for Improvement Financial Resources/Insurance Housing Resilience Social Support Talents/Skills  ADL's:  Intact  Cognition:  WNL  Sleep:        Treatment Plan Summary: Recommend consider continue to  hold lithium. Cleared by psychiatry.  Follow-up with established outpatient provider at [facility].  Disposition: No evidence of imminent risk to self or others at present.   Patient does not meet criteria for psychiatric inpatient admission. Discussed crisis plan, support from social network, calling 911, coming to the Emergency Department, and calling Suicide Hotline.  This service was provided via telemedicine using a 2-way, interactive audio and video technology.  Names of all persons participating in  this telemedicine service and their role in this encounter. Name: Starletta Morua Role: Patient  Name: Letitia Libra Role: [place], [place] 11/21/2019 10:54 AM  Attest to NP Note

## 2019-11-22 LAB — URINE CULTURE: Culture: 10000 — AB

## 2019-11-22 LAB — CARBAPENEM RESISTANCE PANEL
Carba Resistance IMP Gene: NOT DETECTED
Carba Resistance KPC Gene: NOT DETECTED
Carba Resistance NDM Gene: NOT DETECTED
Carba Resistance OXA48 Gene: NOT DETECTED
Carba Resistance VIM Gene: NOT DETECTED

## 2019-11-22 LAB — BASIC METABOLIC PANEL
Anion gap: 7 (ref 5–15)
BUN: 9 mg/dL (ref 6–20)
CO2: 29 mmol/L (ref 22–32)
Calcium: 9.9 mg/dL (ref 8.9–10.3)
Chloride: 105 mmol/L (ref 98–111)
Creatinine, Ser: 1.17 mg/dL — ABNORMAL HIGH (ref 0.44–1.00)
GFR calc Af Amer: >60 mL/min (ref >60)
GFR calc non Af Amer: 52 mL/min — ABNORMAL LOW (ref >60)
Glucose, Bld: 117 mg/dL — ABNORMAL HIGH (ref 70–99)
Potassium: 4.8 mmol/L (ref 3.5–5.1)
Sodium: 141 mmol/L (ref 135–145)

## 2019-11-22 MED ORDER — CIPROFLOXACIN HCL 500 MG PO TABS
500.0000 mg | ORAL_TABLET | Freq: Every day | ORAL | Status: AC
  Administered 2019-11-22 – 2019-11-24 (×3): 500 mg via ORAL
  Filled 2019-11-22 (×3): qty 1

## 2019-11-22 MED ORDER — CIPROFLOXACIN HCL 500 MG PO TABS: 500.0000 mg | ORAL_TABLET | Freq: Every day | ORAL | Status: DC

## 2019-11-22 NOTE — Progress Notes (Signed)
PROGRESS NOTE    Paula Dickson  [id number] DOB: [birth year] DOA: 11/17/2019 PCP: Sandi Mariscal, MD   Brief Narrative:  HPI On 11/17/2019 by Dr. Wynetta Fines Paula Dickson is a 53 y.o. female with medical history significant of hypertension, schizophrenia, bipolar affective disorder, anxiety depression, on chronic lithium treatment, presented to the ER for generalized weakness associated with tremors and confusion was sent by her sister for evaluation.  Patient was completed 1 course of 6-day Keflex for UTI yesterday.   Patient sister chorionitis for patient became confused and has new onset of tremors and abnormal speech.   Patient denied any pain, fever chills no cough no dysuria no diarrhea.  Interim history Patient admitted with lithium toxicity and hyponatremia.  Nephrology was consulted.  Currently on IV fluids. Mental status improving slowly Assessment & Plan   Acute metabolic and toxic encephalopathy -Suspect multifactorial including lithium toxicity and UTI -improving slowly  -Patient currently alert and oriented to self, place, time -Continue to monitor  Possible UTI -Patient unable to voice his symptoms of dysuria -UA rare bacteria, 6-10 WBC, large leukocytes -urine culture ony showed 10K GNR- still pending susceptibilities  -Blood culture show no growth -Currently on ceftriaxone  Lithium toxicity with acute renal insufficiency -Lithium level on admission was 1.39 -Lithium has been held, however level still within normal limits -Creatinine on admission was 1.19, currently 1.17 (upon review of patient's chart, her creatinine has ranged from 0.9-1.2 -Nephrology was consulted and appreciated and is since signed off  Bipolar affective disorder with anxiety and depression -As above lithium held -Continue haldol, lamictal -Psychiatry consulted and appreciated and recommended to continue to hold lithium. Continue haldol and lamictal   Hypernatremia -Placed  on D5 water, sodium improved from 151 to 141 -Continue to monitor BMP  Dehydration -Continue IV fluids  Vomiting -overnight 12/23-24, patient had episodes of vomiting partially digested food -Speech recommended regular diet with thin liquids -Denies further vomiting, nausea or abdominal pain at this time  Essential hypertension -continue amlodipine, coreg  Dystonia -Continue congentin  Hypokalemia -potassium 4.8 -resolved with replacement  -continue to monitor BMP  Deconditioning -PT consulted, recommended SNF with rolling walker  DVT Prophylaxis  heparin  Code Status: Full  Family Communication: None at bedside  Disposition Plan: Admitted. Pending improvement. Dispo SNF- [facility] consulted  Consultants Nephrology  Procedures  None  Antibiotics   Anti-infectives (From admission, onward)   Start     Dose/Rate Route Frequency Ordered Stop   11/18/19 1445  cefTRIAXone (ROCEPHIN) 1 g in sodium chloride 0.9 % 100 mL IVPB     1 g 200 mL/hr over 30 Minutes Intravenous Every 24 hours 11/18/19 1431        Subjective:   [name] seen and examined today.  Patient with no complaints this morning.  Denies current chest pain, shortness of breath, abdominal pain, or vomiting, diarrhea or constipation, dizziness or headache. Objective:   Vitals:   11/21/19 0925 11/21/19 1817 11/21/19 2108 11/22/19 0447  BP: (!) 153/91 139/89 137/79 (!) 156/89  Pulse: (!) 58 67 61 (!) 59  Resp: 18 18 16 17   Temp: 98.9 F (37.2 C) (!) 97.4 F (36.3 C) 98.2 F (36.8 C) 98.2 F (36.8 C)  TempSrc:      SpO2: 100% 98% 99% 100%  Weight:   64.1 kg   Height:        Intake/Output Summary (Last 24 hours) at 11/22/2019 0902 Last data filed at 11/22/2019 0600 Gross per 24 hour  Intake 2216.16 ml  Output 1775 ml  Net 441.16 ml   Filed Weights   11/18/19 2224 11/19/19 2102 11/21/19 2108  Weight: 68.2 kg 64 kg 64.1 kg   Exam  General: Well developed, well nourished, NAD, appears  stated age  50: NCAT, mucous membranes moist.   Cardiovascular: S1 S2 auscultated, RRR  Respiratory: Clear to auscultation bilaterally with equal chest rise  Abdomen: Soft, nontender, nondistended, + bowel sounds  Extremities: warm dry without cyanosis clubbing or edema  Neuro: AAOx3, tremor, otherwise nonfocal  Psych: Appropriate mood and affect  Data Reviewed: I have personally reviewed following labs and imaging studies  CBC: Recent Labs  Lab 11/17/19 1249 11/19/19 0510  WBC 9.9 9.3  NEUTROABS 7.4 6.1  HGB 14.4 12.7  HCT 45.4 39.9  MCV 89.9 89.5  PLT 271 123456   Basic Metabolic Panel: Recent Labs  Lab 11/18/19 0433 11/19/19 0510 11/20/19 0430 11/21/19 0423 11/22/19 0357  NA 147* 151* 143 141 141  K 4.1 3.7 3.4* 3.7 4.8  CL 112* 116* 104 103 105  CO2 23 27 28 28 29   GLUCOSE 102* 102* 110* 107* 117*  BUN 6 7 7 6 9   CREATININE 1.02* 1.17* 1.20* 1.16* 1.17*  CALCIUM 10.6* 11.1* 10.4* 10.2 9.9  PHOS 3.4  --   --   --   --    GFR: Estimated Creatinine Clearance: 46.6 mL/min (A) (by C-G formula based on SCr of 1.17 mg/dL (H)). Liver Function Tests: Recent Labs  Lab 11/17/19 1245 11/18/19 0433 11/19/19 0510  AST 17  --  14*  ALT 22  --  20  ALKPHOS 117  --  105  BILITOT 0.4  --  0.8  PROT 7.0  --  6.7  ALBUMIN 4.0 3.7 3.7   No results for input(s): LIPASE, AMYLASE in the last 168 hours. Recent Labs  Lab 11/17/19 1245  AMMONIA 19   Coagulation Profile: No results for input(s): INR, PROTIME in the last 168 hours. Cardiac Enzymes: No results for input(s): CKTOTAL, CKMB, CKMBINDEX, TROPONINI in the last 168 hours. BNP (last 3 results) No results for input(s): PROBNP in the last 8760 hours. HbA1C: No results for input(s): HGBA1C in the last 72 hours. CBG: Recent Labs  Lab 11/17/19 2113  GLUCAP 100*   Lipid Profile: No results for input(s): CHOL, HDL, LDLCALC, TRIG, CHOLHDL, LDLDIRECT in the last 72 hours. Thyroid Function Tests: No results  for input(s): TSH, T4TOTAL, FREET4, T3FREE, THYROIDAB in the last 72 hours. Anemia Panel: No results for input(s): VITAMINB12, FOLATE, FERRITIN, TIBC, IRON, RETICCTPCT in the last 72 hours. Urine analysis:    Component Value Date/Time   COLORURINE YELLOW 11/17/2019 [place] 11/17/2019 1541   LABSPEC 1.005 11/17/2019 1541   PHURINE 7.0 11/17/2019 1541   GLUCOSEU NEGATIVE 11/17/2019 1541   HGBUR NEGATIVE 11/17/2019 1541   BILIRUBINUR NEGATIVE 11/17/2019 1541   BILIRUBINUR neg 12/27/2015 1421   KETONESUR NEGATIVE 11/17/2019 1541   PROTEINUR NEGATIVE 11/17/2019 1541   UROBILINOGEN 0.2 12/27/2015 1421   UROBILINOGEN 0.2 06/14/2015 0040   NITRITE NEGATIVE 11/17/2019 1541   LEUKOCYTESUR LARGE (A) 11/17/2019 1541   Sepsis Labs: @LABRCNTIP (procalcitonin:4,lacticidven:4)  ) Recent Results (from the past 240 hour(s))  SARS CORONAVIRUS 2 (TAT 6-24 HRS) Nasopharyngeal Nasopharyngeal Swab     Status: None   Collection Time: 11/17/19  3:26 PM   Specimen: Nasopharyngeal Swab  Result Value Ref Range Status   SARS Coronavirus 2 NEGATIVE NEGATIVE Final    Comment: (  NOTE) SARS-CoV-2 target nucleic acids are NOT DETECTED. The SARS-CoV-2 RNA is generally detectable in upper and lower respiratory specimens during the acute phase of infection. Negative results do not preclude SARS-CoV-2 infection, do not rule out co-infections with other pathogens, and should not be used as the sole basis for treatment or other patient management decisions. Negative results must be combined with clinical observations, patient history, and epidemiological information. The expected result is Negative. Fact Sheet for Patients: SugarRoll.be Fact Sheet for Healthcare Providers: https://www.woods-mathews.com/ This test is not yet approved or cleared by the [place] FDA and  has been authorized for detection and/or diagnosis of SARS-CoV-2 by FDA under an  Emergency Use Authorization (EUA). This EUA will remain  in effect (meaning this test can be used) for the duration of the COVID-19 declaration under Section 56 4(b)(1) of the Act, 21 U.S.C. section 360bbb-3(b)(1), unless the authorization is terminated or revoked sooner. Performed at [facility], [place] [address] [place], [place] [address]   Urine culture     Status: Abnormal (Preliminary result)   Collection Time: 11/17/19  6:41 PM   Specimen: Urine, Clean Catch  Result Value Ref Range Status   Specimen Description URINE, CLEAN CATCH  Final   Special Requests NONE  Final   Culture (A)  Final    10,000 COLONIES/mL GRAM NEGATIVE RODS REPEATING  IDENTIFICATION AND SUSCEPTIBILITIES TO FOLLOW Performed at [facility], [place] [address], [place], [place] [address]    Report Status PENDING  Incomplete  Culture, blood (routine x 2)     Status: None (Preliminary result)   Collection Time: 11/18/19  2:57 PM   Specimen: BLOOD LEFT HAND  Result Value Ref Range Status   Specimen Description BLOOD LEFT HAND  Final   Special Requests   Final    BOTTLES DRAWN AEROBIC AND ANAEROBIC Blood Culture adequate volume   Culture   Final    NO GROWTH 4 DAYS Performed at [facility], [place] [address], [place], [place] [address]    Report Status PENDING  Incomplete  Culture, blood (routine x 2)     Status: None (Preliminary result)   Collection Time: 11/18/19  3:04 PM   Specimen: BLOOD RIGHT HAND  Result Value Ref Range Status   Specimen Description BLOOD RIGHT HAND  Final   Special Requests   Final    BOTTLES DRAWN AEROBIC AND ANAEROBIC Blood Culture adequate volume   Culture   Final    NO GROWTH 4 DAYS Performed at [facility], [place] [address] [place], [place] [address]    Report Status PENDING  Incomplete      Radiology Studies: No results found.   Scheduled Meds: . amLODipine  5 mg Oral Daily  . benztropine  2 mg Oral BID  . carvedilol  25 mg Oral BID WC  .  haloperidol  20 mg Oral QHS  . heparin  5,000 Units Subcutaneous Q8H  . lamoTRIgine  50 mg Oral BID  . sodium chloride flush  10-40 mL Intracatheter Q12H  . temazepam  30 mg Oral QHS   Continuous Infusions: . cefTRIAXone (ROCEPHIN)  IV Stopped (11/21/19 1451)  . dextrose 75 mL/hr at 11/22/19 0618     LOS: 5 days   Time Spent in minutes   30 minutes  Ashey Tramontana D.O. on 11/22/2019 at 9:02 AM  Between 7am to 7pm - Please see pager noted on [email or web address]  After 7pm go to [email or web address]  And  look for the night coverage person covering for me after hours  [facility] Office  [phone]

## 2019-11-22 NOTE — Plan of Care (Signed)
  Problem: Pain Managment: Goal: General experience of comfort will improve Outcome: Progressing   

## 2019-11-23 LAB — CULTURE, BLOOD (ROUTINE X 2)
Culture: NO GROWTH
Culture: NO GROWTH
Special Requests: ADEQUATE
Special Requests: ADEQUATE

## 2019-11-23 LAB — GLUCOSE, CAPILLARY: Glucose-Capillary: 115 mg/dL — ABNORMAL HIGH (ref 70–99)

## 2019-11-23 NOTE — Progress Notes (Signed)
PROGRESS NOTE    Paula Dickson  [id number] DOB: [birth year] DOA: 11/17/2019 PCP: Sandi Mariscal, MD   Brief Narrative:  HPI On 11/17/2019 by Dr. Wynetta Fines Paula Dickson is a 53 y.o. female with medical history significant of hypertension, schizophrenia, bipolar affective disorder, anxiety depression, on chronic lithium treatment, presented to the ER for generalized weakness associated with tremors and confusion was sent by her sister for evaluation.  Patient was completed 1 course of 6-day Keflex for UTI yesterday.   Patient sister chorionitis for patient became confused and has new onset of tremors and abnormal speech.   Patient denied any pain, fever chills no cough no dysuria no diarrhea.  Interim history Patient admitted with lithium toxicity and hyponatremia.  Nephrology was consulted.  Currently on IV fluids. Mental status improving slowly. Now pending placement  Assessment & Plan   Acute metabolic and toxic encephalopathy -Suspect multifactorial including lithium toxicity and UTI -improving slowly  -Patient currently alert and oriented to self, place, time -Continue to monitor  Possible UTI -Patient unable to voice his symptoms of dysuria -UA rare bacteria, 6-10 WBC, large leukocytes -urine culture ony showed 10K GNR- Enterobacter cloacae- sensitive to cipro, bactrim, imipenem  -Blood culture show no growth -Initially placed on ceftriaxone however given resistance, was placed on ciprofloxacin 500 mg daily for 3 days  Lithium toxicity with acute renal insufficiency -Lithium level on admission was 1.39 -Lithium has been held, however level still within normal limits -Creatinine on admission was 1.19, currently 1.17 (upon review of patient's chart, her creatinine has ranged from 0.9-1.2 -Nephrology was consulted and appreciated and is since signed off  Bipolar affective disorder with anxiety and depression -As above lithium held -Continue haldol,  lamictal -Psychiatry consulted and appreciated and recommended to continue to hold lithium. Continue haldol and lamictal   Hypernatremia -Placed on D5 water, sodium improved from 151 to 141 -Continue to monitor BMP  Dehydration -Continue IV fluids  Vomiting -overnight 12/23-24, patient had episodes of vomiting partially digested food -Speech recommended regular diet with thin liquids -Denies further vomiting, nausea or abdominal pain at this time  Essential hypertension -continue amlodipine, coreg  Dystonia -Continue congentin  Hypokalemia -potassium 4.8 -resolved with replacement  -continue to monitor BMP  Deconditioning -PT consulted, recommended SNF with rolling walker  DVT Prophylaxis  heparin  Code Status: Full  Family Communication: None at bedside  Disposition Plan: Admitted. Dispo SNF likely within 1-2 days - TOC consulted  Consultants Nephrology  Procedures  None  Antibiotics   Anti-infectives (From admission, onward)   Start     Dose/Rate Route Frequency Ordered Stop   11/22/19 1600  ciprofloxacin (CIPRO) tablet 500 mg  Status:  Discontinued     500 mg Oral Daily with breakfast 11/22/19 1553 11/22/19 1557   11/22/19 1600  ciprofloxacin (CIPRO) tablet 500 mg     500 mg Oral Daily with breakfast 11/22/19 1557 11/25/19 0759   11/18/19 1445  cefTRIAXone (ROCEPHIN) 1 g in sodium chloride 0.9 % 100 mL IVPB  Status:  Discontinued     1 g 200 mL/hr over 30 Minutes Intravenous Every 24 hours 11/18/19 1431 11/22/19 1553      Subjective:   Paula Dickson seen and examined today.  Patient with no complaints this morning.  Denies current chest pain, shortness of breath, abdominal pain, or vomiting, diarrhea or constipation, dizziness or headache. Objective:   Vitals:   11/22/19 0938 11/22/19 2131 11/23/19 0545 11/23/19 0922  BP: (!) 146/81 118/70 130/75  103/72  Pulse: (!) 59 60 (!) 58 (!) 57  Resp: 18 16 20 16   Temp: 98 F (36.7 C) 99.3 F (37.4 C) 97.8  F (36.6 C) 97.6 F (36.4 C)  TempSrc: Oral Oral Axillary   SpO2: 96% 97% 100% 97%  Weight:  64.6 kg    Height:        Intake/Output Summary (Last 24 hours) at 11/23/2019 0933 Last data filed at 11/23/2019 [id number] Gross per 24 hour  Intake 1774.82 ml  Output 1150 ml  Net 624.82 ml   Filed Weights   11/19/19 2102 11/21/19 2108 11/22/19 2131  Weight: 64 kg 64.1 kg 64.6 kg   Exam  General: Well developed, well nourished, NAD, appears stated age  68: NCAT,  mucous membranes moist.   Cardiovascular: S1 S2 auscultated, RRR  Respiratory: Clear to auscultation bilaterally  Abdomen: Soft, nontender, nondistended, + bowel sounds  Extremities: warm dry without cyanosis clubbing or edema  Neuro: AAOx3, tremor otherwise nonfocal  Psych: Pleasant, appropriate mood and affect  Data Reviewed: I have personally reviewed following labs and imaging studies  CBC: Recent Labs  Lab 11/17/19 1249 11/19/19 0510  WBC 9.9 9.3  NEUTROABS 7.4 6.1  HGB 14.4 12.7  HCT 45.4 39.9  MCV 89.9 89.5  PLT 271 123456   Basic Metabolic Panel: Recent Labs  Lab 11/18/19 0433 11/19/19 0510 11/20/19 0430 11/21/19 0423 11/22/19 0357  NA 147* 151* 143 141 141  K 4.1 3.7 3.4* 3.7 4.8  CL 112* 116* 104 103 105  CO2 23 27 28 28 29   GLUCOSE 102* 102* 110* 107* 117*  BUN 6 7 7 6 9   CREATININE 1.02* 1.17* 1.20* 1.16* 1.17*  CALCIUM 10.6* 11.1* 10.4* 10.2 9.9  PHOS 3.4  --   --   --   --    GFR: Estimated Creatinine Clearance: 46.7 mL/min (A) (by C-G formula based on SCr of 1.17 mg/dL (H)). Liver Function Tests: Recent Labs  Lab 11/17/19 1245 11/18/19 0433 11/19/19 0510  AST 17  --  14*  ALT 22  --  20  ALKPHOS 117  --  105  BILITOT 0.4  --  0.8  PROT 7.0  --  6.7  ALBUMIN 4.0 3.7 3.7   No results for input(s): LIPASE, AMYLASE in the last 168 hours. Recent Labs  Lab 11/17/19 1245  AMMONIA 19   Coagulation Profile: No results for input(s): INR, PROTIME in the last 168  hours. Cardiac Enzymes: No results for input(s): CKTOTAL, CKMB, CKMBINDEX, TROPONINI in the last 168 hours. BNP (last 3 results) No results for input(s): PROBNP in the last 8760 hours. HbA1C: No results for input(s): HGBA1C in the last 72 hours. CBG: Recent Labs  Lab 11/17/19 2113  GLUCAP 100*   Lipid Profile: No results for input(s): CHOL, HDL, LDLCALC, TRIG, CHOLHDL, LDLDIRECT in the last 72 hours. Thyroid Function Tests: No results for input(s): TSH, T4TOTAL, FREET4, T3FREE, THYROIDAB in the last 72 hours. Anemia Panel: No results for input(s): VITAMINB12, FOLATE, FERRITIN, TIBC, IRON, RETICCTPCT in the last 72 hours. Urine analysis:    Component Value Date/Time   COLORURINE YELLOW 11/17/2019 [place] 11/17/2019 1541   LABSPEC 1.005 11/17/2019 1541   PHURINE 7.0 11/17/2019 1541   GLUCOSEU NEGATIVE 11/17/2019 1541   HGBUR NEGATIVE 11/17/2019 1541   BILIRUBINUR NEGATIVE 11/17/2019 1541   BILIRUBINUR neg 12/27/2015 1421   KETONESUR NEGATIVE 11/17/2019 1541   PROTEINUR NEGATIVE 11/17/2019 1541   UROBILINOGEN 0.2 12/27/2015 1421  UROBILINOGEN 0.2 06/14/2015 0040   NITRITE NEGATIVE 11/17/2019 1541   LEUKOCYTESUR LARGE (A) 11/17/2019 1541   Sepsis Labs: @LABRCNTIP (procalcitonin:4,lacticidven:4)  ) Recent Results (from the past 240 hour(s))  SARS CORONAVIRUS 2 (TAT 6-24 HRS) Nasopharyngeal Nasopharyngeal Swab     Status: None   Collection Time: 11/17/19  3:26 PM   Specimen: Nasopharyngeal Swab  Result Value Ref Range Status   SARS Coronavirus 2 NEGATIVE NEGATIVE Final    Comment: (NOTE) SARS-CoV-2 target nucleic acids are NOT DETECTED. The SARS-CoV-2 RNA is generally detectable in upper and lower respiratory specimens during the acute phase of infection. Negative results do not preclude SARS-CoV-2 infection, do not rule out co-infections with other pathogens, and should not be used as the sole basis for treatment or other patient management  decisions. Negative results must be combined with clinical observations, patient history, and epidemiological information. The expected result is Negative. Fact Sheet for Patients: SugarRoll.be Fact Sheet for Healthcare Providers: https://www.woods-mathews.com/ This test is not yet approved or cleared by the [place] FDA and  has been authorized for detection and/or diagnosis of SARS-CoV-2 by FDA under an Emergency Use Authorization (EUA). This EUA will remain  in effect (meaning this test can be used) for the duration of the COVID-19 declaration under Section 56 4(b)(1) of the Act, 21 U.S.C. section 360bbb-3(b)(1), unless the authorization is terminated or revoked sooner. Performed at [facility], [place] [address] [place], [place] [address]   Urine culture     Status: Abnormal   Collection Time: 11/17/19  6:41 PM   Specimen: Urine, Clean Catch  Result Value Ref Range Status   Specimen Description URINE, CLEAN CATCH  Final   Special Requests   Final    NONE Performed at [facility], [place] [address] [place], [place] [address]    Culture 10,000 COLONIES/mL ENTEROBACTER CLOACAE (A)  Final   Report Status 11/22/2019 FINAL  Final   Organism ID, Bacteria ENTEROBACTER CLOACAE (A)  Final      Susceptibility   Enterobacter cloacae - MIC*    CEFAZOLIN >=64 RESISTANT Resistant     CEFTRIAXONE >=64 RESISTANT Resistant     CIPROFLOXACIN 1 SENSITIVE Sensitive     GENTAMICIN >=16 RESISTANT Resistant     IMIPENEM <=0.25 SENSITIVE Sensitive     NITROFURANTOIN 64 INTERMEDIATE Intermediate     TRIMETH/SULFA 40 SENSITIVE Sensitive     PIP/TAZO >=128 RESISTANT Resistant     * 10,000 COLONIES/mL ENTEROBACTER CLOACAE  Carbapenem Resistance Panel     Status: None   Collection Time: 11/17/19  6:41 PM  Result Value Ref Range Status   Carba Resistance IMP Gene NOT DETECTED NOT DETECTED Final   Carba Resistance VIM Gene NOT DETECTED NOT  DETECTED Final   Carba Resistance NDM Gene NOT DETECTED NOT DETECTED Final   Carba Resistance KPC Gene NOT DETECTED NOT DETECTED Final   Carba Resistance OXA48 Gene NOT DETECTED NOT DETECTED Final    Comment: (NOTE) Cepheid Carba-R is an FDA-cleared nucleic acid amplification test  (NAAT)for the detection and differentiation of genes encoding the  most prevalent carbapenemases in bacterial isolate samples. Carbapenemase gene identification and implementation of comprehensive  infection control measures are recommended by the CDC to prevent the  spread of the resistant organisms. Performed at [facility], [place] [address] [place], [place] [address]   Culture, blood (routine x 2)     Status: None   Collection Time: 11/18/19  2:57 PM   Specimen: BLOOD  LEFT HAND  Result Value Ref Range Status   Specimen Description BLOOD LEFT HAND  Final   Special Requests   Final    BOTTLES DRAWN AEROBIC AND ANAEROBIC Blood Culture adequate volume   Culture   Final    NO GROWTH 5 DAYS Performed at [facility], [address] [place], [place] [address]    Report Status 11/23/2019 FINAL  Final  Culture, blood (routine x 2)     Status: None   Collection Time: 11/18/19  3:04 PM   Specimen: BLOOD RIGHT HAND  Result Value Ref Range Status   Specimen Description BLOOD RIGHT HAND  Final   Special Requests   Final    BOTTLES DRAWN AEROBIC AND ANAEROBIC Blood Culture adequate volume   Culture   Final    NO GROWTH 5 DAYS Performed at [facility], [place] [address], [place], [place] [address]    Report Status 11/23/2019 FINAL  Final      Radiology Studies: No results found.   Scheduled Meds: . amLODipine  5 mg Oral Daily  . benztropine  2 mg Oral BID  . carvedilol  25 mg Oral BID WC  . ciprofloxacin  500 mg Oral Q breakfast  . haloperidol  20 mg Oral QHS  . heparin  5,000 Units Subcutaneous Q8H  . lamoTRIgine  50 mg Oral BID  . sodium chloride flush  10-40 mL Intracatheter Q12H   . temazepam  30 mg Oral QHS   Continuous Infusions: . dextrose 75 mL/hr at 11/22/19 1953     LOS: 6 days   Time Spent in minutes   30 minutes  Shadonna Benedick D.O. on 11/23/2019 at 9:33 AM  Between 7am to 7pm - Please see pager noted on [email or web address]  After 7pm go to [email or web address]  And look for the night coverage person covering for me after hours  [facility] Office  [phone]

## 2019-11-24 LAB — BASIC METABOLIC PANEL
Anion gap: 9 (ref 5–15)
BUN: 10 mg/dL (ref 6–20)
CO2: 28 mmol/L (ref 22–32)
Calcium: 9.8 mg/dL (ref 8.9–10.3)
Chloride: 99 mmol/L (ref 98–111)
Creatinine, Ser: 1.21 mg/dL — ABNORMAL HIGH (ref 0.44–1.00)
GFR calc Af Amer: 58 mL/min — ABNORMAL LOW (ref >60)
GFR calc non Af Amer: 50 mL/min — ABNORMAL LOW (ref >60)
Glucose, Bld: 105 mg/dL — ABNORMAL HIGH (ref 70–99)
Potassium: 3.6 mmol/L (ref 3.5–5.1)
Sodium: 136 mmol/L (ref 135–145)

## 2019-11-24 NOTE — NC FL2 (Signed)
[place] LEVEL OF CARE SCREENING TOOL     IDENTIFICATION  Patient Name: Paula Dickson Birthdate: 23-Jul-1966 Sex: female Admission Date (Current Location): 11/17/2019  [place] and [place] Number:  Kathleen Argue [id number] [place] and Address:  [facility], [place] [address] [place], [place] [address]      Provider Number: [id number]  Attending Physician Name and Address:  Cristal Ford, DO  Relative Name and Phone Number:  Cherylann Rheinheimer - sister; [phone]    Current Level of Care: Hospital Recommended Level of Care: [place] Prior Approval Number:    Date Approved/Denied:   PASRR Number: (Submitted for [place] 11/28 - Pending)  Discharge Plan: SNF    Current Diagnoses: Patient Active Problem List   Diagnosis Date Noted  . Acute metabolic encephalopathy [id number]  . AMS (altered mental status) 11/17/2019  . Lithium toxicity 11/17/2019  . Hyperthyroidism 10/14/2019  . Medication side effect, initial encounter   . Schizoaffective disorder ([place]) 03/13/2019  . 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 Bipolar affective disorder, current episode mixed ([place]) 11/01/2018  . Toxic encephalopathy 07/14/2017  . Suicide attempt ([place]) 07/13/2017  . Low TSH level 07/13/2017  . Overdose of psychotropic 07/12/2017  . Valproic acid toxicity 07/12/2017  . Hyperammonemia ([place]) 07/12/2017  . Schizophrenia ([place]) 07/12/2017  . Bipolar disorder, most recent episode depressed ([place]) 07/12/2017  . Insomnia 12/27/2015  . Abnormal urinalysis 12/27/2015  . 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 Bipolar I disorder, most recent episode manic, severe with psychotic features ([place]) 06/14/2015    Orientation RESPIRATION BLADDER Height & Weight     Self, Time, Place  Normal Incontinent(External catheter placed 10/15/19) Weight: 142 lb 6.7 oz (64.6 kg) Height:  5\' 1"  (154.9 cm)  BEHAVIORAL SYMPTOMS/MOOD  NEUROLOGICAL BOWEL NUTRITION STATUS      Continent Diet(Heart healthy)  AMBULATORY STATUS COMMUNICATION OF NEEDS Skin   Limited Assist Verbally Other (Comment)(Skin cracking - bilateral feet)                       Personal Care Assistance Level of Assistance  Bathing, Feeding, Dressing Bathing Assistance: Maximum assistance(Bathing Supervision upper body; Mod assist lower body) Feeding assistance: Limited assistance Dressing Assistance: Maximum assistance(Upper body supervision; Lower body total assist)     Functional Limitations Info  Sight, Hearing, Speech Sight Info: Adequate Hearing Info: Adequate Speech Info: Adequate    SPECIAL CARE FACTORS FREQUENCY  PT (By licensed PT), OT (By licensed OT), Speech therapy     PT Frequency: Evaluated in hospital 12/24. PT at SNF eval and treat, a minimum of 5 days per week OT Frequency: Evaluated in hospital 12/25. OT at SNF eval and treat, a minimum of 5 days per week     Speech Therapy Frequency: Evaluated 12/25. Regular solids and thin liquids recommended      Contractures      Additional Factors Info  Code Status, Allergies Code Status Info: Full Allergies Info: No known allergies           Current Medications (11/24/2019):  This is the current hospital active medication list Current Facility-Administered Medications  Medication Dose Route Frequency Provider Last Rate Last Admin  . acetaminophen (TYLENOL) tablet 650 mg  650 mg Oral Q6H PRN Wynetta Fines T, MD       Or  . acetaminophen (TYLENOL) suppository 650 mg  650 mg Rectal Q6H PRN Lequita Halt, MD      .  amLODipine (NORVASC) tablet 5 mg  5 mg Oral Daily Rosita Fire, MD   5 mg at 11/24/19 [id number]  . benztropine (COGENTIN) tablet 2 mg  2 mg Oral BID Wynetta Fines T, MD   2 mg at 11/24/19 [id number]  . carvedilol (COREG) tablet 25 mg  25 mg Oral BID WC Lequita Halt, MD   25 mg at 11/24/19 0926  . dextrose 5 % solution   Intravenous Continuous Cristal Ford, DO 75  mL/hr at 11/24/19 1103 New Bag at 11/24/19 1103  . haloperidol (HALDOL) tablet 20 mg  20 mg Oral QHS Wynetta Fines T, MD   20 mg at 11/23/19 2120  . heparin injection 5,000 Units  5,000 Units Subcutaneous Q8H Wynetta Fines T, MD   5,000 Units at 11/24/19 1415  . lamoTRIgine (LAMICTAL) tablet 50 mg  50 mg Oral BID Lequita Halt, MD   50 mg at 11/24/19 [id number]  . sodium chloride flush (NS) 0.9 % injection 10-40 mL  10-40 mL Intracatheter Q12H Swayze, Ava, DO   10 mL at 11/21/19 0855  . sodium chloride flush (NS) 0.9 % injection 10-40 mL  10-40 mL Intracatheter PRN Swayze, Ava, DO   10 mL at 11/21/19 0428  . temazepam (RESTORIL) capsule 30 mg  30 mg Oral QHS Lequita Halt, MD   30 mg at 11/23/19 2120     Discharge Medications: Please see discharge summary for a list of discharge medications.  Relevant Imaging Results:  Relevant Lab Results:   Additional Information ss#[id number].  Sable Feil, LCSW

## 2019-11-24 NOTE — Plan of Care (Signed)
°  Problem: Coping: °Goal: Level of anxiety will decrease °Outcome: Progressing °  °

## 2019-11-24 NOTE — TOC Initial Note (Signed)
Transition of Care [facility]) - Initial/Assessment Note    Patient Details  Name: Paula Dickson MRN: [id number] Date of Birth: 06/26/1966  Transition of [facility]) CM/SW Contact:    Sable Feil, LCSW Phone Number: 11/24/2019, 12:39 PM  Clinical Narrative:  Due to patient's orientation status talked with sister Gaile Tygart [phone]) regarding patient's discharge disposition and recommendation of  ST rehab. Sister expressed understanding and agreement with ST rehab. Ms. Gombar reported that her sister lives alone and she checks on her every day.  When asked, sister responded that patient has no children. CSW explained that SNF search process and provided Ms. Wolfson with information to look-up facilities online via [email or web address]. Ms. Hochstein reported that her brother went to [facility], her dad has been to [facility] and [facility], and her mother has been to [place] [facility]. She advised CSW that her father did at the Hospice facility in [place].                Expected Discharge Plan: [place] Barriers to Discharge: Continued Medical Work up   Patient Goals and CMS Choice Patient states their goals for this hospitalization and ongoing recovery are:: Talked wto patient's sister, Ilean China as patient not oriented to situation/confused. Sister agreeable to [place] rehab. CMS Medicare.gov Compare Post Acute Care list provided to:: Patient Represenative (must comment)(Informed sister of [email or web address]/) Choice offered to / list presented to : Sibling  Expected Discharge Plan and Services Expected Discharge Plan: [place]                                            Prior Living Arrangements/Services     Patient language and need for interpreter reviewed:: No Do you feel safe going back to the place where you live?: No(Sister agreeable to ST rehab as patient lives alone)      Need for Family Participation in Patient Care: Yes  (Comment)(Per Ms. Mater - sister, patient lives alone and she checks on her daily) Care giver support system in place?: Yes (comment)   Criminal Activity/Legal Involvement Pertinent to Current Situation/Hospitalization: No - Comment as needed  Activities of Daily Living Home Assistive Devices/Equipment: Blood pressure cuff, Eyeglasses ADL Screening (condition at time of admission) Patient's cognitive ability adequate to safely complete daily activities?: Yes Is the patient deaf or have difficulty hearing?: No Does the patient have difficulty seeing, even when wearing glasses/contacts?: No Does the patient have difficulty concentrating, remembering, or making decisions?: Yes Patient able to express need for assistance with ADLs?: Yes Does the patient have difficulty dressing or bathing?: No Independently performs ADLs?: Yes (appropriate for developmental age) Does the patient have difficulty walking or climbing stairs?: No Weakness of Legs: None Weakness of Arms/Hands: None  Permission Sought/Granted Permission sought to share information with : Other (comment)(Permission not sought with patient due to confusion/orientation status.) Permission granted to share information with : No              Emotional Assessment Appearance:: Other (Comment Required(Did not visit with patient, talked with sister by phone) Attitude/Demeanor/Rapport: (Did not visit with patient) Affect (typically observed): Unable to Assess(Did not visit with patient) Orientation: : Oriented to Self, Oriented to Place, Oriented to  Time Alcohol / Substance Use: Never Used, Tobacco Use, Alcohol Use(Per H&P, patient reported that she has never smoked and does not drink or use illicit  drugs) Psych Involvement: Yes (comment)  Admission diagnosis:  Altered mental status, unspecified altered mental status type [R41.82] AMS (altered mental status) [R41.82] Patient Active Problem List   Diagnosis Date Noted  . Acute  metabolic encephalopathy 123XX123  . AMS (altered mental status) 11/17/2019  . Lithium toxicity 11/17/2019  . Hyperthyroidism 10/14/2019  . Medication side effect, initial encounter   . Schizoaffective disorder ([place]) 03/13/2019  . 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 Bipolar affective disorder, current episode mixed ([place]) 11/01/2018  . Toxic encephalopathy 07/14/2017  . Suicide attempt ([place]) 07/13/2017  . Low TSH level 07/13/2017  . Overdose of psychotropic 07/12/2017  . Valproic acid toxicity 07/12/2017  . Hyperammonemia ([place]) 07/12/2017  . Schizophrenia ([place]) 07/12/2017  . Bipolar disorder, most recent episode depressed ([place]) 07/12/2017  . Insomnia 12/27/2015  . Abnormal urinalysis 12/27/2015  . 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 Bipolar I disorder, most recent episode manic, severe with psychotic features ([place]) 06/14/2015   PCP:  Sandi Mariscal, MD Pharmacy:   [id number] [place], [address] [place] [address] Phone: [phone] Fax: [phone]    Social Determinants of Health (SDOH) Interventions  No SDOH interventions needed at this time  Readmission Risk Interventions No flowsheet data found.

## 2019-11-24 NOTE — Discharge Instructions (Signed)
Lithium Toxicity Lithium toxicity, which is also called lithium poisoning, is the condition of having too much lithium in the blood. Lithium is a medicine that is used to treat bipolar disorder. Lithium toxicity can be life-threatening. What are the causes? This condition is caused by:  Ingesting too much lithium, causing levels of lithium to rise in your body.  Taking lithium on a regular basis and: ? Having a condition that raises lithium levels in the body. ? Taking another medicine that raises lithium levels in the body.  Taking excess amounts of lithium in trying to take one's life (suicide attempt).  A decrease in kidney (renal) function because of dehydration, or as a side effect of another medicine. What increases the risk? This condition is more likely to develop in people:  Who are young or old.  Who have kidney disease.  Who have heart disease.  Who have dehydration that is caused by sweating or diarrhea.  Who have low sodium levels in the body. Certain medicines can increase the risk for this condition. They include:  Water pills (diuretics).  Certain medicines for high blood pressure.  Nonsteroidal anti-inflammatory drugs (NSAIDs). What are the signs or symptoms? Symptoms of mild to moderate lithium toxicity are:  Nausea and vomiting.  Diarrhea.  Drowsiness.  Thirst.  Muscle weakness.  Slurred speech.  Lack of coordination.  Frequent urination.  Being restless (agitation). The symptoms of moderate to severe lithium toxicity are:  Blurred vision.  Giddiness.  Ringing in the ears.  Severe muscle spasms.  Seizures.  Abnormal heart rhythm.  Loss of consciousness or coma. How is this diagnosed? This condition may be diagnosed based on:  Your signs and symptoms.  Your use of lithium. You will be asked about how much lithium you take, how long you have been taking it, and when you took your last dose.  Blood tests. These are done to  check the level of lithium in your blood. How is this treated? Treatment for this condition depends on how severe it is. It often involves special monitoring and hospitalization.  For mild or moderate toxicity, your dosage of lithium may be reduced or stopped.  For severe toxicity, lithium may be removed from your body. This is done in a hospital emergency department. It may involve: ? Gastric lavage. A tube is placed through your nose or mouth into your stomach. The tube is used to remove lithium that has not yet been digested. It may also be used to put medicines directly into your stomach to help stop lithium from being absorbed. ? Medicines that increase removal of lithium by your kidneys. ? Use of an artificial kidney to clean your blood (dialysis). This is usually done only in the most severe cases. Follow these instructions at home:      Take over-the-counter and prescription medicines only as told by your health care provider.  Drink enough fluid to keep your urine pale yellow.  If your toxicity was a result of an intentional overdose, work with a Musician (psychiatrist or counselor). How is this prevented? Lithium toxicity is preventable. To keep it from recurring:  Take medicines only as told by your health care provider.  Drink enough fluid to keep your urine pale yellow.  Talk with your health care provider before starting a low-salt (low-sodium) diet.  Have your blood lithium levels checked regularly.  Watch for signs and symptoms of lithium toxicity. If you have signs or symptoms, get treatment early. This can  help keep severe symptoms from developing.  Always ask about the risk of interactions when starting a new medicine. Contact a health care provider if:  You have signs or symptoms of mild or moderate lithium toxicity after you receive treatment, even if your blood lithium level is normal. Get help right away if:  Your symptoms get  worse.  You have signs or symptoms of severe lithium toxicity after you receive treatment. Summary  Lithium toxicity, which is also called lithium poisoning, is the condition of having too much lithium in your blood.  This either results from taking too much lithium or from changes that cause the medicine to be eliminated from your body more slowly.  Treatment depends on the severity of the condition but often involves special monitoring and hospitalization. This information is not intended to replace advice given to you by your health care provider. Make sure you discuss any questions you have with your health care provider. Document Released: 09/10/2007 Document Revised: 03/07/2019 Document Reviewed: 11/23/2017 Elsevier Patient Education  2020 [employer].

## 2019-11-24 NOTE — Progress Notes (Signed)
Physical Therapy Treatment Patient Details Name: Paula Dickson MRN: [id number] DOB: [birth year] Today's Date: 11/24/2019    History of Present Illness 53 yo female presenting with confusion, tremors, and weakness. Admitted with acute metabolic and toxic encephalopathy secondary to lithium toxicity and potential UTI. PMH schizophrenia, HTN, bipolar disorder, anxiety    PT Comments    Pt was seen for initiating mobility with transfers and then did strengthening to BLE's.  Tremors are challenging her balance, with both difficulty controlling sitting balance and stepping with assist of PT.  She will be seen for control of balance, to safely maneuver walker and to work on sit to stand for safe descent.  Has difficulty reaching to steady herself for controlled sitting and standing.  Follow Up Recommendations  SNF     Equipment Recommendations  Rolling walker with 5" wheels    Recommendations for Other Services       Precautions / Restrictions Precautions Precautions: Fall Precaution Comments: [place] Restrictions Weight Bearing Restrictions: No    Mobility  Bed Mobility Overal bed mobility: Needs Assistance Bed Mobility: Supine to Sit     Supine to sit: Min assist     General bed mobility comments: min assist to steady sitting balance  Transfers Overall transfer level: Needs assistance Equipment used: 1 person hand held assist Transfers: Sit to/from Stand;Stand Pivot Transfers Sit to Stand: Mod assist Stand pivot transfers: Min assist       General transfer comment: min assist once standing but mod to power up or control descent  Ambulation/Gait         Gait velocity: decreased Gait velocity interpretation: <1.31 ft/sec, indicative of household ambulator General Gait Details: steps were related to transfers   Stairs             Wheelchair Mobility    Modified Rankin (Stroke Patients Only)       Balance Overall balance assessment: Needs  assistance Sitting-balance support: No upper extremity supported;Feet supported Sitting balance-Leahy Scale: Fair Sitting balance - Comments: R lateral lean requiring Min guard-MinA to maintain upright Postural control: Right lateral lean Standing balance support: Bilateral upper extremity supported Standing balance-Leahy Scale: Poor                              Cognition Arousal/Alertness: Awake/alert Behavior During Therapy: Impulsive;WFL for tasks assessed/performed Overall Cognitive Status: No family/caregiver present to determine baseline cognitive functioning Area of Impairment: Safety/judgement;Following commands                   Current Attention Level: Selective Memory: Decreased short-term memory Following Commands: Follows one step commands inconsistently;Follows one step commands with increased time Safety/Judgement: Decreased awareness of safety;Decreased awareness of deficits Awareness: Intellectual Problem Solving: Slow processing;Difficulty sequencing;Requires verbal cues General Comments: Impulsive to sit up on side of bed and tries to get up quickly before ready with PT      Exercises General Exercises - Lower Extremity Ankle Circles/Pumps: Strengthening;5 reps Long Arc Quad: Strengthening;10 reps Heel Slides: Strengthening;10 reps    General Comments General comments (skin integrity, edema, etc.): pt was moving with poor control of UE's to steady for sitting balance, and for use to stand.  sitting in chair to eat meal at end of session and was struggling to control liquid in a cup for drinking it      Pertinent Vitals/Pain Pain Assessment: No/denies pain    Home Living  Prior Function            PT Goals (current goals can now be found in the care plan section) Acute Rehab PT Goals Patient Stated Goal: to go home Progress towards PT goals: Progressing toward goals    Frequency    Min  2X/week      PT Plan Current plan remains appropriate    Co-evaluation              AM-PAC PT "6 Clicks" Mobility   Outcome Measure  Help needed turning from your back to your side while in a flat bed without using bedrails?: A Little Help needed moving from lying on your back to sitting on the side of a flat bed without using bedrails?: A Little Help needed moving to and from a bed to a chair (including a wheelchair)?: A Lot Help needed standing up from a chair using your arms (e.g., wheelchair or bedside chair)?: A Lot Help needed to walk in hospital room?: A Lot Help needed climbing 3-5 steps with a railing? : Total 6 Click Score: 13    End of Session Equipment Utilized During Treatment: Gait belt Activity Tolerance: Treatment limited secondary to medical complications (Comment) Patient left: in chair;with call bell/phone within reach;with chair alarm set Nurse Communication: Mobility status PT Visit Diagnosis: Unsteadiness on feet (R26.81);Difficulty in walking, not elsewhere classified (R26.2);Muscle weakness (generalized) (M62.81)     Time: [id number] PT Time Calculation (min) (ACUTE ONLY): 23 min  Charges:  $Therapeutic Exercise: 8-22 mins $Therapeutic Activity: 8-22 mins                    [name] 11/24/2019, 1:00 PM   Mee Hives, PT MS Acute Rehab Dept. Number: [place] [facility] [place]

## 2019-11-24 NOTE — Progress Notes (Signed)
PROGRESS NOTE    Paula Dickson  [id number] DOB: 1966-02-20 DOA: 11/17/2019 PCP: Sandi Mariscal, MD   Brief Narrative:  HPI On 11/17/2019 by Dr. Wynetta Fines Paula Dickson is a 53 y.o. female with medical history significant of hypertension, schizophrenia, bipolar affective disorder, anxiety depression, on chronic lithium treatment, presented to the ER for generalized weakness associated with tremors and confusion was sent by her sister for evaluation.  Patient was completed 1 course of 6-day Keflex for UTI yesterday.   Patient sister chorionitis for patient became confused and has new onset of tremors and abnormal speech.   Patient denied any pain, fever chills no cough no dysuria no diarrhea.  Interim history Patient admitted with lithium toxicity and hyponatremia.  Nephrology was consulted.  Currently on IV fluids. Mental status has improved. Now pending placement  Assessment & Plan   Acute metabolic and toxic encephalopathy -Suspect multifactorial including lithium toxicity and UTI -Improved -Patient currently alert and oriented to self, place, time  Possible UTI -Patient unable to voice his symptoms of dysuria -UA rare bacteria, 6-10 WBC, large leukocytes -urine culture ony showed 10K GNR- Enterobacter cloacae- sensitive to cipro, bactrim, imipenem  -Blood culture show no growth -Initially placed on ceftriaxone however given resistance, was placed on ciprofloxacin 500 mg daily for 3 days- completed today  Lithium toxicity with acute renal insufficiency -Lithium level on admission was 1.39 -Lithium has been held, however level still within normal limits -Creatinine on admission was 1.19, currently 1.2(upon review of patient's chart, her creatinine has ranged from 0.9-1.2 -Nephrology was consulted and appreciated and is since signed off  Bipolar affective disorder with anxiety and depression -As above lithium held -Continue haldol, lamictal -Psychiatry consulted  and appreciated and recommended to continue to hold lithium. Continue haldol and lamictal   Hypernatremia -Placed on D5 water, sodium improved from 151 to 136 -Continue to monitor BMP  Dehydration -Continue IV fluids  Vomiting -overnight 12/23-24, patient had episodes of vomiting partially digested food -Speech recommended regular diet with thin liquids -Denies further vomiting, nausea or abdominal pain at this time  Essential hypertension -continue amlodipine, coreg  Dystonia -Continue congentin  Hypokalemia -potassium 3.6 -resolved with replacement  -continue to monitor BMP  Deconditioning -PT consulted, recommended SNF with rolling walker  DVT Prophylaxis  heparin  Code Status: Full  Family Communication: None at bedside  Disposition Plan: Admitted. Dispo SNF likely within 1-2 days - TOC consulted  Consultants Nephrology  Procedures  None  Antibiotics   Anti-infectives (From admission, onward)   Start     Dose/Rate Route Frequency Ordered Stop   11/22/19 1600  ciprofloxacin (CIPRO) tablet 500 mg  Status:  Discontinued     500 mg Oral Daily with breakfast 11/22/19 1553 11/22/19 1557   11/22/19 1600  ciprofloxacin (CIPRO) tablet 500 mg     500 mg Oral Daily with breakfast 11/22/19 1557 11/24/19 0926   11/18/19 1445  cefTRIAXone (ROCEPHIN) 1 g in sodium chloride 0.9 % 100 mL IVPB  Status:  Discontinued     1 g 200 mL/hr over 30 Minutes Intravenous Every 24 hours 11/18/19 1431 11/22/19 1553      Subjective:   Paula Dickson seen and examined today.  Patient with no complaints today. Denies current chest pain, shortness of breath, abdominal pain, N/V/D/C, dizziness, headache.  Objective:   Vitals:   11/23/19 1652 11/23/19 2050 11/24/19 0637 11/24/19 1003  BP: 119/78 122/64 (!) 152/83 130/83  Pulse: (!) 57 (!) 59 (!) 59 65  Resp: 18  18 18   Temp: 97.8 F (36.6 C) 98.8 F (37.1 C) 98.4 F (36.9 C) 98.6 F (37 C)  TempSrc:  Oral Oral Oral  SpO2: 100%  100% 97% 97%  Weight:      Height:        Intake/Output Summary (Last 24 hours) at 11/24/2019 1318 Last data filed at 11/24/2019 1300 Gross per 24 hour  Intake 2450.15 ml  Output 2500 ml  Net -49.85 ml   Filed Weights   11/19/19 2102 11/21/19 2108 11/22/19 2131  Weight: 64 kg 64.1 kg 64.6 kg   Exam  General: Well developed, well nourished, NAD, appears stated age  38: NCAT, mucous membranes moist.   Cardiovascular: S1 S2 auscultated, RRR, no murmur  Respiratory: Clear to auscultation bilaterally with equal chest rise  Abdomen: Soft, nontender, nondistended, + bowel sounds  Extremities: warm dry without cyanosis clubbing or edema  Neuro: AAOx3, tremor improved, otherwise nonfocal  Psych: Normal affect and demeanor, pleasant   Data Reviewed: I have personally reviewed following labs and imaging studies  CBC: Recent Labs  Lab 11/19/19 0510  WBC 9.3  NEUTROABS 6.1  HGB 12.7  HCT 39.9  MCV 89.5  PLT 123456   Basic Metabolic Panel: Recent Labs  Lab 11/18/19 0433 11/19/19 0510 11/20/19 0430 11/21/19 0423 11/22/19 0357 11/24/19 0341  NA 147* 151* 143 141 141 136  K 4.1 3.7 3.4* 3.7 4.8 3.6  CL 112* 116* 104 103 105 99  CO2 23 27 28 28 29 28   GLUCOSE 102* 102* 110* 107* 117* 105*  BUN 6 7 7 6 9 10   CREATININE 1.02* 1.17* 1.20* 1.16* 1.17* 1.21*  CALCIUM 10.6* 11.1* 10.4* 10.2 9.9 9.8  PHOS 3.4  --   --   --   --   --    GFR: Estimated Creatinine Clearance: 45.2 mL/min (A) (by C-G formula based on SCr of 1.21 mg/dL (H)). Liver Function Tests: Recent Labs  Lab 11/18/19 0433 11/19/19 0510  AST  --  14*  ALT  --  20  ALKPHOS  --  105  BILITOT  --  0.8  PROT  --  6.7  ALBUMIN 3.7 3.7   No results for input(s): LIPASE, AMYLASE in the last 168 hours. No results for input(s): AMMONIA in the last 168 hours. Coagulation Profile: No results for input(s): INR, PROTIME in the last 168 hours. Cardiac Enzymes: No results for input(s): CKTOTAL, CKMB,  CKMBINDEX, TROPONINI in the last 168 hours. BNP (last 3 results) No results for input(s): PROBNP in the last 8760 hours. HbA1C: No results for input(s): HGBA1C in the last 72 hours. CBG: Recent Labs  Lab 11/17/19 2113 11/23/19 2135  GLUCAP 100* 115*   Lipid Profile: No results for input(s): CHOL, HDL, LDLCALC, TRIG, CHOLHDL, LDLDIRECT in the last 72 hours. Thyroid Function Tests: No results for input(s): TSH, T4TOTAL, FREET4, T3FREE, THYROIDAB in the last 72 hours. Anemia Panel: No results for input(s): VITAMINB12, FOLATE, FERRITIN, TIBC, IRON, RETICCTPCT in the last 72 hours. Urine analysis:    Component Value Date/Time   COLORURINE YELLOW 11/17/2019 [place] 11/17/2019 1541   LABSPEC 1.005 11/17/2019 1541   PHURINE 7.0 11/17/2019 1541   GLUCOSEU NEGATIVE 11/17/2019 1541   HGBUR NEGATIVE 11/17/2019 1541   BILIRUBINUR NEGATIVE 11/17/2019 1541   BILIRUBINUR neg 12/27/2015 1421   KETONESUR NEGATIVE 11/17/2019 1541   PROTEINUR NEGATIVE 11/17/2019 1541   UROBILINOGEN 0.2 12/27/2015 1421   UROBILINOGEN 0.2 06/14/2015 0040  NITRITE NEGATIVE 11/17/2019 1541   LEUKOCYTESUR LARGE (A) 11/17/2019 1541   Sepsis Labs: @LABRCNTIP (procalcitonin:4,lacticidven:4)  ) Recent Results (from the past 240 hour(s))  SARS CORONAVIRUS 2 (TAT 6-24 HRS) Nasopharyngeal Nasopharyngeal Swab     Status: None   Collection Time: 11/17/19  3:26 PM   Specimen: Nasopharyngeal Swab  Result Value Ref Range Status   SARS Coronavirus 2 NEGATIVE NEGATIVE Final    Comment: (NOTE) SARS-CoV-2 target nucleic acids are NOT DETECTED. The SARS-CoV-2 RNA is generally detectable in upper and lower respiratory specimens during the acute phase of infection. Negative results do not preclude SARS-CoV-2 infection, do not rule out co-infections with other pathogens, and should not be used as the sole basis for treatment or other patient management decisions. Negative results must be combined with  clinical observations, patient history, and epidemiological information. The expected result is Negative. Fact Sheet for Patients: SugarRoll.be Fact Sheet for Healthcare Providers: https://www.woods-mathews.com/ This test is not yet approved or cleared by the [place] FDA and  has been authorized for detection and/or diagnosis of SARS-CoV-2 by FDA under an Emergency Use Authorization (EUA). This EUA will remain  in effect (meaning this test can be used) for the duration of the COVID-19 declaration under Section 56 4(b)(1) of the Act, 21 U.S.C. section 360bbb-3(b)(1), unless the authorization is terminated or revoked sooner. Performed at [facility], [place] [address], [place], [place] [address]   Urine culture     Status: Abnormal   Collection Time: 11/17/19  6:41 PM   Specimen: Urine, Clean Catch  Result Value Ref Range Status   Specimen Description URINE, CLEAN CATCH  Final   Special Requests   Final    NONE Performed at [facility], [place] [address], [place], [place] [address]    Culture 10,000 COLONIES/mL ENTEROBACTER CLOACAE (A)  Final   Report Status 11/22/2019 FINAL  Final   Organism ID, Bacteria ENTEROBACTER CLOACAE (A)  Final      Susceptibility   Enterobacter cloacae - MIC*    CEFAZOLIN >=64 RESISTANT Resistant     CEFTRIAXONE >=64 RESISTANT Resistant     CIPROFLOXACIN 1 SENSITIVE Sensitive     GENTAMICIN >=16 RESISTANT Resistant     IMIPENEM <=0.25 SENSITIVE Sensitive     NITROFURANTOIN 64 INTERMEDIATE Intermediate     TRIMETH/SULFA 40 SENSITIVE Sensitive     PIP/TAZO >=128 RESISTANT Resistant     * 10,000 COLONIES/mL ENTEROBACTER CLOACAE  Carbapenem Resistance Panel     Status: None   Collection Time: 11/17/19  6:41 PM  Result Value Ref Range Status   Carba Resistance IMP Gene NOT DETECTED NOT DETECTED Final   Carba Resistance VIM Gene NOT DETECTED NOT DETECTED Final   Carba Resistance NDM Gene NOT DETECTED  NOT DETECTED Final   Carba Resistance KPC Gene NOT DETECTED NOT DETECTED Final   Carba Resistance OXA48 Gene NOT DETECTED NOT DETECTED Final    Comment: (NOTE) Cepheid Carba-R is an FDA-cleared nucleic acid amplification test  (NAAT)for the detection and differentiation of genes encoding the  most prevalent carbapenemases in bacterial isolate samples. Carbapenemase gene identification and implementation of comprehensive  infection control measures are recommended by the CDC to prevent the  spread of the resistant organisms. Performed at [facility], [place] [address], [place], [place] [address]   Culture, blood (routine x 2)     Status: None   Collection Time: 11/18/19  2:57 PM   Specimen: BLOOD LEFT HAND  Result Value Ref  Range Status   Specimen Description BLOOD LEFT HAND  Final   Special Requests   Final    BOTTLES DRAWN AEROBIC AND ANAEROBIC Blood Culture adequate volume   Culture   Final    NO GROWTH 5 DAYS Performed at [facility], [address] [place], [place] [address]    Report Status 11/23/2019 FINAL  Final  Culture, blood (routine x 2)     Status: None   Collection Time: 11/18/19  3:04 PM   Specimen: BLOOD RIGHT HAND  Result Value Ref Range Status   Specimen Description BLOOD RIGHT HAND  Final   Special Requests   Final    BOTTLES DRAWN AEROBIC AND ANAEROBIC Blood Culture adequate volume   Culture   Final    NO GROWTH 5 DAYS Performed at [facility], [place] [address], [place], [place] [address]    Report Status 11/23/2019 FINAL  Final      Radiology Studies: No results found.   Scheduled Meds: . amLODipine  5 mg Oral Daily  . benztropine  2 mg Oral BID  . carvedilol  25 mg Oral BID WC  . haloperidol  20 mg Oral QHS  . heparin  5,000 Units Subcutaneous Q8H  . lamoTRIgine  50 mg Oral BID  . sodium chloride flush  10-40 mL Intracatheter Q12H  . temazepam  30 mg Oral QHS   Continuous Infusions: . dextrose 75 mL/hr at 11/24/19 1103      LOS: 7 days   Time Spent in minutes   30 minutes  Winry Egnew D.O. on 11/24/2019 at 1:18 PM  Between 7am to 7pm - Please see pager noted on [email or web address]  After 7pm go to [email or web address]  And look for the night coverage person covering for me after hours  [facility] Office  [phone]

## 2019-11-25 LAB — BASIC METABOLIC PANEL
Anion gap: 11 (ref 5–15)
BUN: 13 mg/dL (ref 6–20)
CO2: 28 mmol/L (ref 22–32)
Calcium: 10.3 mg/dL (ref 8.9–10.3)
Chloride: 101 mmol/L (ref 98–111)
Creatinine, Ser: 1.16 mg/dL — ABNORMAL HIGH (ref 0.44–1.00)
GFR calc Af Amer: >60 mL/min (ref >60)
GFR calc non Af Amer: 53 mL/min — ABNORMAL LOW (ref >60)
Glucose, Bld: 100 mg/dL — ABNORMAL HIGH (ref 70–99)
Potassium: 3.8 mmol/L (ref 3.5–5.1)
Sodium: 140 mmol/L (ref 135–145)

## 2019-11-25 LAB — SARS CORONAVIRUS 2 (TAT 6-24 HRS): SARS Coronavirus 2: NEGATIVE

## 2019-11-25 NOTE — Plan of Care (Signed)
  Problem: Education: Goal: Knowledge of General Education information will improve Description: Including pain rating scale, medication(s)/side effects and non-pharmacologic comfort measures Outcome: Not Progressing  Pt continues to have confusion. Alert to self only and unable to process information regarding medications and plan of care.

## 2019-11-25 NOTE — TOC Progression Note (Signed)
Transition of Care [facility]) - Progression Note    Patient Details  Name: Paula Dickson MRN: [id number] Date of Birth: December 03, 1965  Transition of Care [facility]) CM/SW Contact  Paula Salina Mila Homer, LCSW Phone Number: 11/25/2019, 1:34 PM  Clinical Narrative:  Patient's sister, Paula Dickson [phone]) contacted and advised that patient will discharge to [facility] once we receive the PASRR number and COVID test results. Ms. Wojtaszek expressed understanding.      Expected Discharge Plan: Skilled Nursing Facility Barriers to Discharge: Continued Medical Work up  Expected Discharge Plan and Services Expected Discharge Plan: [name]                                             Social Determinants of Health (SDOH) Interventions  No SDOH interventions needed at this time.  Readmission Risk Interventions No flowsheet data found.

## 2019-11-25 NOTE — Progress Notes (Signed)
PROGRESS NOTE    Paula Dickson  [id number] DOB: 1966-04-02 DOA: 11/17/2019 PCP: Sandi Mariscal, MD   Brief Narrative:  HPI On 11/17/2019 by Dr. Wynetta Fines Paula Dickson is a 53 y.o. female with medical history significant of hypertension, schizophrenia, bipolar affective disorder, anxiety depression, on chronic lithium treatment, presented to the ER for generalized weakness associated with tremors and confusion was sent by her sister for evaluation.  Patient was completed 1 course of 6-day Keflex for UTI yesterday.   Patient sister chorionitis for patient became confused and has new onset of tremors and abnormal speech.   Patient denied any pain, fever chills no cough no dysuria no diarrhea.  Interim history Patient admitted with lithium toxicity and hyponatremia.  Nephrology was consulted.  Currently on IV fluids. Mental status has improved. Now pending PASRR and COVID, SNF placement.  Assessment & Plan   Acute metabolic and toxic encephalopathy -Suspect multifactorial including lithium toxicity and UTI -Resolved -Patient currently alert and oriented to self, place, time  Possible UTI -Patient unable to voice his symptoms of dysuria -UA rare bacteria, 6-10 WBC, large leukocytes -urine culture ony showed 10K GNR- Enterobacter cloacae- sensitive to cipro, bactrim, imipenem  -Blood culture show no growth -Initially placed on ceftriaxone however given resistance, was placed on ciprofloxacin 500 mg daily for 3 days- completed  Lithium toxicity with acute renal insufficiency -Lithium level on admission was 1.39 -Lithium has been held, however level still within normal limits -Creatinine on admission was 1.19, currently 1.2(upon review of patient's chart, her creatinine has ranged from 0.9-1.16 -Nephrology was consulted and appreciated and is since signed off  Bipolar affective disorder with anxiety and depression -As above lithium held -Continue haldol,  lamictal -Psychiatry consulted and appreciated and recommended to continue to hold lithium. Continue haldol and lamictal   Hypernatremia -Placed on D5 water, sodium improved from 151 to 140 -Continue to monitor BMP  Dehydration -was placed on IV fluids  Vomiting -overnight 12/23-24, patient had episodes of vomiting partially digested food -Speech recommended regular diet with thin liquids -Denies further vomiting, nausea or abdominal pain at this time  Essential hypertension -continue amlodipine, coreg  Dystonia -Continue congentin  Hypokalemia -potassium 3.8 -resolved with replacement  -continue to monitor BMP  Deconditioning -PT consulted, recommended SNF with rolling walker  DVT Prophylaxis  heparin  Code Status: Full  Family Communication: None at bedside  Disposition Plan: Admitted. Dispo SNF pending PASSR and repeat COVID test.  Consultants Nephrology Psychiatry  Procedures  None  Antibiotics   Anti-infectives (From admission, onward)   Start     Dose/Rate Route Frequency Ordered Stop   11/22/19 1600  ciprofloxacin (CIPRO) tablet 500 mg  Status:  Discontinued     500 mg Oral Daily with breakfast 11/22/19 1553 11/22/19 1557   11/22/19 1600  ciprofloxacin (CIPRO) tablet 500 mg     500 mg Oral Daily with breakfast 11/22/19 1557 11/24/19 0926   11/18/19 1445  cefTRIAXone (ROCEPHIN) 1 g in sodium chloride 0.9 % 100 mL IVPB  Status:  Discontinued     1 g 200 mL/hr over 30 Minutes Intravenous Every 24 hours 11/18/19 1431 11/22/19 1553      Subjective:   Paula Dickson seen and examined today.  Patient with no complaints of chest pain, shortness of breath, dental pain, nausea or vomiting, diarrhea constipation, dizziness or headache.  Looking forward to rehab. Objective:   Vitals:   11/24/19 1717 11/24/19 2058 11/25/19 0442 11/25/19 0913  BP: 118/86 124/73 Marland Kitchen)  137/94 138/80  Pulse: 77 71 75 75  Resp: 18 18 16 18   Temp: 99.4 F (37.4 C) 99 F (37.2 C)  98.2 F (36.8 C) 98.2 F (36.8 C)  TempSrc: Oral Oral  Oral  SpO2: 97% 96% 98% 97%  Weight:  64.6 kg    Height:        Intake/Output Summary (Last 24 hours) at 11/25/2019 1539 Last data filed at 11/25/2019 1300 Gross per 24 hour  Intake 1848.64 ml  Output 2250 ml  Net -401.36 ml   Filed Weights   11/21/19 2108 11/22/19 2131 11/24/19 2058  Weight: 64.1 kg 64.6 kg 64.6 kg   Exam  General: Well developed, well nourished, NAD, appears stated age  26: NCAT, mucous membranes moist.   Cardiovascular: S1 S2 auscultated, RRR, no murmur  Respiratory: Clear to auscultation bilaterally   Abdomen: Soft, nontender, nondistended, + bowel sounds  Extremities: warm dry without cyanosis clubbing or edema  Neuro: AAOx3, tremor improved, otherwise nonfocal  Psych: Pleasant, appropriate mood and affect  Data Reviewed: I have personally reviewed following labs and imaging studies  CBC: Recent Labs  Lab 11/19/19 0510  WBC 9.3  NEUTROABS 6.1  HGB 12.7  HCT 39.9  MCV 89.5  PLT 123456   Basic Metabolic Panel: Recent Labs  Lab 11/20/19 0430 11/21/19 0423 11/22/19 0357 11/24/19 0341 11/25/19 0355  NA 143 141 141 136 140  K 3.4* 3.7 4.8 3.6 3.8  CL 104 103 105 99 101  CO2 28 28 29 28 28   GLUCOSE 110* 107* 117* 105* 100*  BUN 7 6 9 10 13   CREATININE 1.20* 1.16* 1.17* 1.21* 1.16*  CALCIUM 10.4* 10.2 9.9 9.8 10.3   GFR: Estimated Creatinine Clearance: 47.1 mL/min (A) (by C-G formula based on SCr of 1.16 mg/dL (H)). Liver Function Tests: Recent Labs  Lab 11/19/19 0510  AST 14*  ALT 20  ALKPHOS 105  BILITOT 0.8  PROT 6.7  ALBUMIN 3.7   No results for input(s): LIPASE, AMYLASE in the last 168 hours. No results for input(s): AMMONIA in the last 168 hours. Coagulation Profile: No results for input(s): INR, PROTIME in the last 168 hours. Cardiac Enzymes: No results for input(s): CKTOTAL, CKMB, CKMBINDEX, TROPONINI in the last 168 hours. BNP (last 3 results) No  results for input(s): PROBNP in the last 8760 hours. HbA1C: No results for input(s): HGBA1C in the last 72 hours. CBG: Recent Labs  Lab 11/23/19 2135  GLUCAP 115*   Lipid Profile: No results for input(s): CHOL, HDL, LDLCALC, TRIG, CHOLHDL, LDLDIRECT in the last 72 hours. Thyroid Function Tests: No results for input(s): TSH, T4TOTAL, FREET4, T3FREE, THYROIDAB in the last 72 hours. Anemia Panel: No results for input(s): VITAMINB12, FOLATE, FERRITIN, TIBC, IRON, RETICCTPCT in the last 72 hours. Urine analysis:    Component Value Date/Time   COLORURINE YELLOW 11/17/2019 [place] 11/17/2019 1541   LABSPEC 1.005 11/17/2019 1541   PHURINE 7.0 11/17/2019 1541   GLUCOSEU NEGATIVE 11/17/2019 1541   HGBUR NEGATIVE 11/17/2019 1541   BILIRUBINUR NEGATIVE 11/17/2019 1541   BILIRUBINUR neg 12/27/2015 1421   KETONESUR NEGATIVE 11/17/2019 1541   PROTEINUR NEGATIVE 11/17/2019 1541   UROBILINOGEN 0.2 12/27/2015 1421   UROBILINOGEN 0.2 06/14/2015 0040   NITRITE NEGATIVE 11/17/2019 1541   LEUKOCYTESUR LARGE (A) 11/17/2019 1541   Sepsis Labs: @LABRCNTIP (procalcitonin:4,lacticidven:4)  ) Recent Results (from the past 240 hour(s))  SARS CORONAVIRUS 2 (TAT 6-24 HRS) Nasopharyngeal Nasopharyngeal Swab     Status: None  Collection Time: 11/17/19  3:26 PM   Specimen: Nasopharyngeal Swab  Result Value Ref Range Status   SARS Coronavirus 2 NEGATIVE NEGATIVE Final    Comment: (NOTE) SARS-CoV-2 target nucleic acids are NOT DETECTED. The SARS-CoV-2 RNA is generally detectable in upper and lower respiratory specimens during the acute phase of infection. Negative results do not preclude SARS-CoV-2 infection, do not rule out co-infections with other pathogens, and should not be used as the sole basis for treatment or other patient management decisions. Negative results must be combined with clinical observations, patient history, and epidemiological information. The expected result  is Negative. Fact Sheet for Patients: SugarRoll.be Fact Sheet for Healthcare Providers: https://www.woods-mathews.com/ This test is not yet approved or cleared by the [place] FDA and  has been authorized for detection and/or diagnosis of SARS-CoV-2 by FDA under an Emergency Use Authorization (EUA). This EUA will remain  in effect (meaning this test can be used) for the duration of the COVID-19 declaration under Section 56 4(b)(1) of the Act, 21 U.S.C. section 360bbb-3(b)(1), unless the authorization is terminated or revoked sooner. Performed at [facility], [place] [address], [place], [place] [address]   Urine culture     Status: Abnormal   Collection Time: 11/17/19  6:41 PM   Specimen: Urine, Clean Catch  Result Value Ref Range Status   Specimen Description URINE, CLEAN CATCH  Final   Special Requests   Final    NONE Performed at [facility], [place] [address], [place], [place] [address]    Culture 10,000 COLONIES/mL ENTEROBACTER CLOACAE (A)  Final   Report Status 11/22/2019 FINAL  Final   Organism ID, Bacteria ENTEROBACTER CLOACAE (A)  Final      Susceptibility   Enterobacter cloacae - MIC*    CEFAZOLIN >=64 RESISTANT Resistant     CEFTRIAXONE >=64 RESISTANT Resistant     CIPROFLOXACIN 1 SENSITIVE Sensitive     GENTAMICIN >=16 RESISTANT Resistant     IMIPENEM <=0.25 SENSITIVE Sensitive     NITROFURANTOIN 64 INTERMEDIATE Intermediate     TRIMETH/SULFA 40 SENSITIVE Sensitive     PIP/TAZO >=128 RESISTANT Resistant     * 10,000 COLONIES/mL ENTEROBACTER CLOACAE  Carbapenem Resistance Panel     Status: None   Collection Time: 11/17/19  6:41 PM  Result Value Ref Range Status   Carba Resistance IMP Gene NOT DETECTED NOT DETECTED Final   Carba Resistance VIM Gene NOT DETECTED NOT DETECTED Final   Carba Resistance NDM Gene NOT DETECTED NOT DETECTED Final   Carba Resistance KPC Gene NOT DETECTED NOT DETECTED Final   Carba  Resistance OXA48 Gene NOT DETECTED NOT DETECTED Final    Comment: (NOTE) Cepheid Carba-R is an FDA-cleared nucleic acid amplification test  (NAAT)for the detection and differentiation of genes encoding the  most prevalent carbapenemases in bacterial isolate samples. Carbapenemase gene identification and implementation of comprehensive  infection control measures are recommended by the CDC to prevent the  spread of the resistant organisms. Performed at [facility], [place] [address] [place], [place] [address]   Culture, blood (routine x 2)     Status: None   Collection Time: 11/18/19  2:57 PM   Specimen: BLOOD LEFT HAND  Result Value Ref Range Status   Specimen Description BLOOD LEFT HAND  Final   Special Requests   Final    BOTTLES DRAWN AEROBIC AND ANAEROBIC Blood Culture adequate volume   Culture   Final    NO GROWTH 5 DAYS  Performed at [facility], [place] [address], [place], [place] [address]    Report Status 11/23/2019 FINAL  Final  Culture, blood (routine x 2)     Status: None   Collection Time: 11/18/19  3:04 PM   Specimen: BLOOD RIGHT HAND  Result Value Ref Range Status   Specimen Description BLOOD RIGHT HAND  Final   Special Requests   Final    BOTTLES DRAWN AEROBIC AND ANAEROBIC Blood Culture adequate volume   Culture   Final    NO GROWTH 5 DAYS Performed at [facility], [place] [address] [place], [place] [address]    Report Status 11/23/2019 FINAL  Final      Radiology Studies: No results found.   Scheduled Meds: . amLODipine  5 mg Oral Daily  . benztropine  2 mg Oral BID  . carvedilol  25 mg Oral BID WC  . haloperidol  20 mg Oral QHS  . heparin  5,000 Units Subcutaneous Q8H  . lamoTRIgine  50 mg Oral BID  . sodium chloride flush  10-40 mL Intracatheter Q12H  . temazepam  30 mg Oral QHS   Continuous Infusions: . dextrose 75 mL/hr at 11/25/19 0857     LOS: 8 days   Time Spent in minutes   30 minutes  Tresea Heine D.O. on  11/25/2019 at 3:39 PM  Between 7am to 7pm - Please see pager noted on [email or web address]  After 7pm go to [email or web address]  And look for the night coverage person covering for me after hours  [facility] Office  [phone]

## 2019-11-26 NOTE — Progress Notes (Signed)
Hospitalist daily note   Fedora Gamache Dasch [id number] DOB: Apr 17, 1966 DOA: 11/17/2019  PCP: Sandi Mariscal, MD   Narrative:  53 year old African-American female known history of schizophrenia, bipolar HTN abnormal TFTs, prior suicidality 2018 with valproic level toxicity Admitted from emergency room on 12/21 secondary to generalized weakness tremors and confusion-felt to be secondary to lithium toxicity  Data Reviewed:  BUN/creatinine 13/1.16 down from 10/1.2 Covid retest is negative Assessment & Plan: Toxic metabolic encephalopathy Possible UTI and lithium toxicity contributing-much improved at this time  Probable UTI UA showed bacteria large leukocytes only 10,000 Enterobacter she was placed on Cipro and completed the same  Lithium toxicity with acute renal insufficiency Creatinine trending downward at was held and stabilized  Bipolar with anxiety depression and suicidality in the past Continue Cogentin 2 mg twice daily, Lamictal 50 twice daily, Haldol 20 at bedtime as needed, Restoril 30 at bedtime  HTN Continue amlodipine 5 daily, Coreg 25 twice daily  Lovenox, no family present, full code, expect discharge when + number obtained per social worker  Subjective: Awake alert coherent no distress EOMI NCAT Consultants:   Nephrology Procedures:    Antimicrobials:       Objective: Vitals:   11/25/19 1656 11/25/19 2057 11/26/19 0444 11/26/19 0924  BP: 120/77 120/81 130/75 106/69  Pulse: 72 66 (!) 54 74  Resp: 18 18 17 18   Temp: 97.7 F (36.5 C) 98.6 F (37 C) 98.4 F (36.9 C) 99.3 F (37.4 C)  TempSrc:  Oral  Oral  SpO2: 97% 98% 96% 95%  Weight:      Height:        Intake/Output Summary (Last 24 hours) at 11/26/2019 0933 Last data filed at 11/26/2019 0600 Gross per 24 hour  Intake 860 ml  Output 850 ml  Net 10 ml   Filed Weights   11/21/19 2108 11/22/19 2131 11/24/19 2058  Weight: 64.1 kg 64.6 kg 64.6 kg    Examination: Awake pleasant no distress EOMI  NCAT no focal deficit S1-S2 no murmur rub or gallop Abdomen soft not tedner no rebound no gaurding Extremity edema is minimal Coherent  Scheduled Meds: . amLODipine  5 mg Oral Daily  . benztropine  2 mg Oral BID  . carvedilol  25 mg Oral BID WC  . haloperidol  20 mg Oral QHS  . heparin  5,000 Units Subcutaneous Q8H  . lamoTRIgine  50 mg Oral BID  . sodium chloride flush  10-40 mL Intracatheter Q12H  . temazepam  30 mg Oral QHS   Continuous Infusions:   LOS: 9 days   Time spent: [place], MD [facility]

## 2019-11-26 NOTE — TOC Progression Note (Addendum)
Transition of Care [facility]) - Progression Note    Patient Details  Name: Paula Dickson MRN: [id number] Date of Birth: May 13, 1966  Transition of [facility]) CM/SW Contact  Sharlet Salina Mila Homer, LCSW Phone Number: 11/26/2019, 2:35 PM  Clinical Narrative: CSW assisted Level II PASRR evaluator [place] with evaluation at the bedside. COVID test has resulted and is negative. Admissions director Olivia Mackie contacted and message left regarding PASRR level II evaluation completed and waiting for PASRR number.      Expected Discharge Plan: Skilled Nursing Facility Barriers to Discharge: Continued Medical Work up  Expected Discharge Plan and Services Expected Discharge Plan: [place]                                            Social Determinants of Health (SDOH) Interventions  Non SDOH interventions needed at this time  Readmission Risk Interventions No flowsheet data found.

## 2019-11-26 NOTE — Progress Notes (Signed)
Physical Therapy Treatment Patient Details Name: Paula Dickson MRN: [id number] DOB: Jun 26, 1966 Today's Date: 11/26/2019    History of Present Illness 53 yo female presenting with confusion, tremors, and weakness. Admitted with acute metabolic and toxic encephalopathy secondary to lithium toxicity and potential UTI. PMH schizophrenia, HTN, bipolar disorder, anxiety    PT Comments    Pt was seen for mobility and focused on gait with HHA as pt is more controlled with standing balance and less tone in mm's.  Her plan is to work toward more independence via safer and more controlled transfers.  Her sitting balance on the chair is fair but is supported with pillows to increase self efficacy with feeding herself.  Pt is taking bites today without spilling as on previous session, and this control is more evident with standing from chair with holding on armrests.   Follow Up Recommendations  SNF     Equipment Recommendations  Rolling walker with 5" wheels    Recommendations for Other Services       Precautions / Restrictions Precautions Precautions: Fall Precaution Comments: [place] Restrictions Weight Bearing Restrictions: No    Mobility  Bed Mobility Overal bed mobility: Needs Assistance Bed Mobility: Supine to Sit     Supine to sit: Min assist     General bed mobility comments: min to support the effort to pull up and steady side of bed  Transfers Overall transfer level: Needs assistance Equipment used: 1 person hand held assist Transfers: Sit to/from Stand Sit to Stand: Min assist Stand pivot transfers: Min assist       General transfer comment: min for initial lift and min to sidestep  Ambulation/Gait Ambulation/Gait assistance: Min assist Gait Distance (Feet): 5 Feet Assistive device: 1 person hand held assist Gait Pattern/deviations: Step-through pattern;Shuffle;Step-to pattern;Narrow base of support;Trunk flexed Gait velocity: decreased   General Gait  Details: steps to chair with help but is more purposeful now   Chief Strategy Officer    Modified Rankin (Stroke Patients Only)       Balance Overall balance assessment: Needs assistance Sitting-balance support: Feet supported;Single extremity supported Sitting balance-Leahy Scale: Fair   Postural control: Right lateral lean Standing balance support: Single extremity supported Standing balance-Leahy Scale: Poor Standing balance comment: poor balance but less help with less tremor to move with purpose                            Cognition Arousal/Alertness: Awake/alert Behavior During Therapy: Impulsive Overall Cognitive Status: No family/caregiver present to determine baseline cognitive functioning Area of Impairment: Awareness;Safety/judgement;Following commands                       Following Commands: Follows one step commands inconsistently;Follows one step commands with increased time Safety/Judgement: Decreased awareness of safety;Decreased awareness of deficits Awareness: Intellectual Problem Solving: Slow processing;Requires verbal cues General Comments: attempts to stand quickly at times      Exercises      General Comments General comments (skin integrity, edema, etc.): pt is able to feed herself more steadily today.  Sitting in chair with meal and able to feed herself without spilling      Pertinent Vitals/Pain Pain Assessment: No/denies pain    Home Living                      Prior Function  PT Goals (current goals can now be found in the care plan section) Acute Rehab PT Goals Patient Stated Goal: to go home Progress towards PT goals: Progressing toward goals    Frequency    Min 2X/week      PT Plan Current plan remains appropriate    Co-evaluation              AM-PAC PT "6 Clicks" Mobility   Outcome Measure  Help needed turning from your back to your side while in  a flat bed without using bedrails?: A Little Help needed moving from lying on your back to sitting on the side of a flat bed without using bedrails?: A Little Help needed moving to and from a bed to a chair (including a wheelchair)?: A Little Help needed standing up from a chair using your arms (e.g., wheelchair or bedside chair)?: A Little Help needed to walk in hospital room?: A Little Help needed climbing 3-5 steps with a railing? : Total 6 Click Score: 16    End of Session Equipment Utilized During Treatment: Gait belt Activity Tolerance: Patient limited by fatigue Patient left: in chair;with call bell/phone within reach;with chair alarm set Nurse Communication: Mobility status PT Visit Diagnosis: Unsteadiness on feet (R26.81);Difficulty in walking, not elsewhere classified (R26.2);Muscle weakness (generalized) (M62.81)     Time: [id number] PT Time Calculation (min) (ACUTE ONLY): 24 min  Charges:  $Gait Training: 8-22 mins $Therapeutic Activity: 8-22 mins                   [name] 11/26/2019, 1:04 PM   Mee Hives, PT MS Acute Rehab Dept. Number: [place] [facility] [place]

## 2019-11-26 NOTE — Plan of Care (Signed)
?  Problem: Coping: ?Goal: Level of anxiety will decrease ?Outcome: Progressing ?  ?Problem: Safety: ?Goal: Ability to remain free from injury will improve ?Outcome: Progressing ?  ?

## 2019-11-26 NOTE — Progress Notes (Signed)
Patient accidentally removed IV midline and MD was text paged and awaiting response.Marland Kitchen

## 2019-11-27 MED ORDER — TEMAZEPAM 30 MG PO CAPS: 30.0000 mg | ORAL_CAPSULE | Freq: Every day | ORAL | 0 refills | Status: DC

## 2019-11-27 MED ORDER — LAMOTRIGINE 25 MG PO TABS: 50.0000 mg | ORAL_TABLET | Freq: Two times a day (BID) | ORAL | 0 refills | Status: DC

## 2019-11-27 MED ORDER — AMLODIPINE BESYLATE 5 MG PO TABS: 5.0000 mg | ORAL_TABLET | Freq: Every day | ORAL | Status: DC

## 2019-11-27 MED ORDER — HALOPERIDOL 10 MG PO TABS: 20.0000 mg | ORAL_TABLET | Freq: Every day | ORAL | 0 refills | Status: DC

## 2019-11-27 MED ORDER — BENZTROPINE MESYLATE 2 MG PO TABS: 2.0000 mg | ORAL_TABLET | Freq: Two times a day (BID) | ORAL | 0 refills | Status: DC

## 2019-11-27 MED ORDER — AMLODIPINE BESYLATE 5 MG PO TABS: 5.0000 mg | ORAL_TABLET | Freq: Every day | ORAL | 0 refills | Status: DC

## 2019-11-27 NOTE — Progress Notes (Signed)
DISCHARGE NOTE HOME Paula Dickson to be discharged to home  per MD order,she refused SNF. Discussed prescriptions and follow up appointments with the patient. Prescriptions given to patient; medication list explained in detail. Patient verbalized understanding.  Skin clean, dry and intact without evidence of skin break down, no evidence of skin tears noted. IV catheter discontinued intact. Site without signs and symptoms of complications. Dressing and pressure applied. Pt denies pain at the site currently. No complaints noted.  Patient free of lines, drains, and wounds.   An After Visit Summary (AVS) was printed and given to the patient. Patient escorted via wheelchair, and discharged home via [place].  [place], Zenon Mayo, RN

## 2019-11-27 NOTE — TOC Transition Note (Signed)
Transition of Care [facility]) - CM/SW Discharge Note **Discharged home, transported by cab   Patient Details  Name: Paula Dickson MRN: [id number] Date of Birth: [birth year]  [facility]) CM/SW Contact:  Sable Feil, LCSW Phone Number: 11/27/2019, 5:42 PM   Clinical Narrative:  [facility]c disposition [facility] pending receipt of PASRR number. However, once received today [facility] has no bed availability today, Friday, or over the weekend. Facilities contacted that did not respond in Epic and no rehab bed available. Contacted [facility] and they could take patient today, however patient adamantly refused to discharge to this facility Patient's sister and brother attempted to talk with her about going to [facility], however patient continued to refuse. MD talked with patient and she continued to refuse [facility], so d/c plan is home with [facility]. Therapy services arranged through [place].       Final next level of care: [place] Barriers to Discharge: Barriers Resolved   Patient Goals and CMS Choice Patient states their goals for this hospitalization and ongoing recovery are:: Patient's family wants her to get stronger so that she will be safer at home. CMS Medicare.gov Compare Post Acute Care list provided to:: Patient Represenative (must comment)(Sister [name] Bobb) Choice offered to / list presented to : Sibling  Discharge Placement PASRR number recieved: 11/27/19([phone] F, eff. 11/27/19 - 12/27/19)            Patient chooses bed at: Other - please specify in the comment section below:(Patient will discharge home) Patient to be transferred to facility by: Patient will be transported home by taxi Name of family member notified: Lucious Groves <agtoer - [phone] Patient and family notified of of transfer: 11/27/19  Discharge Plan and Services                          HH Arranged: PT([facility]) [place]:  [place] ([place]) Date [place]: 11/27/19      Social Determinants of Health (SDOH) Interventions  No SDOH interventions needed at discharge   Readmission Risk Interventions No flowsheet data found.

## 2019-11-27 NOTE — Discharge Summary (Addendum)
Physician Discharge Summary  Paula Dickson [id number] DOB: Apr 09, 1966 DOA: 11/17/2019  PCP: Sandi Mariscal, MD  Admit date: 11/17/2019 Discharge date: 11/27/2019  Time spent: 23 minutes  Recommendations for Outpatient Follow-up:  1. Do not use lithium again for mood control-instead ensure that patient takes multiple meds as per below 2. Needs Chem-12 within 1 week 3. Follow-up with outpatient psychiatrist  Discharge Diagnoses:  Principal Problem:   Acute metabolic encephalopathy Active Problems:   AMS (altered mental status)   Lithium toxicity   Discharge Condition: Improved   Diet recommendation: Heart healthy  Filed Weights   11/22/19 2131 11/24/19 2058 11/26/19 2039  Weight: 64.6 kg 64.6 kg 64.6 kg    History of present illness:  53 year old African-American female known history of schizophrenia, bipolar HTN abnormal TFTs, prior suicidality 2018 with valproic level toxicity Admitted from emergency room on 12/21 secondary to generalized weakness tremors and confusion-felt to be secondary to lithium toxicity  Hospital Course:  Toxic metabolic encephalopathy Possible UTI and lithium toxicity contributing-much improved at this time She was much better at d/c and declined SNF placement as her options were limited She was d/c home with 1 mo of meds and instructions to follow with PCP 1 week  Probable UTI UA showed bacteria large leukocytes only 10,000 Enterobacter she was placed on Cipro and completed the same  Lithium toxicity with acute renal insufficiency Creatinine trending downward at was held and stabilized  Bipolar with anxiety depression and suicidality in the past Continue Cogentin 2 mg twice daily, Lamictal 50 twice daily, Haldol 20 at bedtime as needed, Restoril 30 at bedtime meds given on discharge--  HTN Continue amlodipine 5 daily, Coreg 25 twice daily  Consultations:  Psychiatry   Discharge Exam: Vitals:   11/27/19 0450 11/27/19 1000   BP: 110/70 112/77  Pulse: 64 66  Resp: 18 18  Temp: 98.4 F (36.9 C) 98 F (36.7 C)  SpO2: 97% 98%    General: awake alert coherent no distress EOMI NCAT no focal deficit Cardiovascular: S1-S2 Respiratory:  clinically clear no added sound No Extremity edema  Discharge Instructions   Discharge Instructions    Diet - low sodium heart healthy   Complete by: As directed    Discharge instructions   Complete by: As directed    Patient will be discharged to skilled nursing facility, continue physical and occupational therapy.  Patient will need to follow up with primary care provider within one week of discharge.  Follow up with [name]. Patient should continue medications as prescribed.  Patient should follow a heart healthy diet.   Discharge instructions   Complete by: As directed    Looks at your meds carefully as many have changed Make sure you follow up with your primary MD in 1 week and get labs   Increase activity slowly   Complete by: As directed    Increase activity slowly   Complete by: As directed      Allergies as of 11/27/2019   No Known Allergies     Medication List    STOP taking these medications   cloNIDine 0.1 MG tablet Commonly known as: CATAPRES   lithium 600 MG capsule   lithium carbonate 300 MG capsule     TAKE these medications   amLODipine 5 MG tablet Commonly known as: NORVASC Take 1 tablet (5 mg total) by mouth daily.   benztropine 2 MG tablet Commonly known as: COGENTIN Take 1 tablet (2 mg total) by mouth 2 (two) times daily.  carvedilol 25 MG tablet Commonly known as: COREG Take 1 tablet (25 mg total) by mouth 2 (two) times daily with a meal.   haloperidol 10 MG tablet Commonly known as: HALDOL Take 2 tablets (20 mg total) by mouth at bedtime.   lamoTRIgine 25 MG tablet Commonly known as: LAMICTAL Take 2 tablets (50 mg total) by mouth 2 (two) times daily.   temazepam 30 MG capsule Commonly known as: RESTORIL Take 1 capsule  (30 mg total) by mouth at bedtime.      No Known Allergies Follow-up Information    Sandi Mariscal, MD. Schedule an appointment as soon as possible for a visit in 1 week(s).   Specialty: Internal Medicine Why: Hospital follow up Contact information: [place] [id number] [phone]            The results of significant diagnostics from this hospitalization (including imaging, microbiology, ancillary and laboratory) are listed below for reference.    Significant Diagnostic Studies: DG Chest 1 View  Result Date: 11/11/2019 CLINICAL DATA:  Weakness. EXAM: CHEST  1 VIEW COMPARISON:  Chest x-ray 10/13/2019. FINDINGS: Mediastinum and hilar structures normal. Borderline cardiomegaly with mild pulmonary venous congestion, this may be secondary to AP technique. No focal infiltrate. No pleural effusion or pneumothorax. No acute bony abnormality. IMPRESSION: Borderline cardiomegaly with mild pulmonary venous congestion, this may be secondary to AP technique. 2.  Low lung volumes.  No acute pulmonary disease. Electronically Signed   By: Marcello Moores  Register   On: 11/11/2019 12:43   CT Head Wo Contrast  Result Date: 11/17/2019 CLINICAL DATA:  Altered mental status. EXAM: CT HEAD WITHOUT CONTRAST TECHNIQUE: Contiguous axial images were obtained from the base of the skull through the vertex without intravenous contrast. COMPARISON:  11/15/2018. Brain MR dated 10/13/2019. FINDINGS: Brain: Normal appearing cerebral hemispheres and posterior fossa structures. Normal size and position of the ventricles. No intracranial hemorrhage, mass lesion or CT evidence of acute infarction. Vascular: No hyperdense vessel or unexpected calcification. Skull: Normal. Negative for fracture or focal lesion. Sinuses/Orbits: Left ethmoid and sphenoid sinus mucosal thickening and mild mucosal thickening/retained secretions in the right maxillary sinus. Unremarkable orbits. Other: None. IMPRESSION: 1. No  intracranial abnormality. 2. 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 Electronically Signed   By: Claudie Revering M.D.   On: 11/17/2019 14:53    Microbiology: Recent Results (from the past 240 hour(s))  SARS CORONAVIRUS 2 (TAT 6-24 HRS) Nasopharyngeal Nasopharyngeal Swab     Status: None   Collection Time: 11/17/19  3:26 PM   Specimen: Nasopharyngeal Swab  Result Value Ref Range Status   SARS Coronavirus 2 NEGATIVE NEGATIVE Final    Comment: (NOTE) SARS-CoV-2 target nucleic acids are NOT DETECTED. The SARS-CoV-2 RNA is generally detectable in upper and lower respiratory specimens during the acute phase of infection. Negative results do not preclude SARS-CoV-2 infection, do not rule out co-infections with other pathogens, and should not be used as the sole basis for treatment or other patient management decisions. Negative results must be combined with clinical observations, patient history, and epidemiological information. The expected result is Negative. Fact Sheet for Patients: SugarRoll.be Fact Sheet for Healthcare Providers: https://www.woods-mathews.com/ This test is not yet approved or cleared by the [place] FDA and  has been authorized for detection and/or diagnosis of SARS-CoV-2 by FDA under an Emergency Use Authorization (EUA). This EUA will remain  in effect (meaning this test can be used) for the duration of the  COVID-19 declaration under Section 56 4(b)(1) of the Act, 21 U.S.C. section 360bbb-3(b)(1), unless the authorization is terminated or revoked sooner. Performed at [facility], [place] [address], [place], [place] [address]   Urine culture     Status: Abnormal   Collection Time: 11/17/19  6:41 PM   Specimen: Urine, Clean Catch  Result Value Ref Range Status   Specimen Description URINE, CLEAN CATCH  Final   Special Requests   Final    NONE Performed at [facility], [place] [address] [place], [place] [address]    Culture 10,000 COLONIES/mL ENTEROBACTER CLOACAE (A)  Final   Report Status 11/22/2019 FINAL  Final   Organism ID, Bacteria ENTEROBACTER CLOACAE (A)  Final      Susceptibility   Enterobacter cloacae - MIC*    CEFAZOLIN >=64 RESISTANT Resistant     CEFTRIAXONE >=64 RESISTANT Resistant     CIPROFLOXACIN 1 SENSITIVE Sensitive     GENTAMICIN >=16 RESISTANT Resistant     IMIPENEM <=0.25 SENSITIVE Sensitive     NITROFURANTOIN 64 INTERMEDIATE Intermediate     TRIMETH/SULFA 40 SENSITIVE Sensitive     PIP/TAZO >=128 RESISTANT Resistant     * 10,000 COLONIES/mL ENTEROBACTER CLOACAE  Carbapenem Resistance Panel     Status: None   Collection Time: 11/17/19  6:41 PM  Result Value Ref Range Status   Carba Resistance IMP Gene NOT DETECTED NOT DETECTED Final   Carba Resistance VIM Gene NOT DETECTED NOT DETECTED Final   Carba Resistance NDM Gene NOT DETECTED NOT DETECTED Final   Carba Resistance KPC Gene NOT DETECTED NOT DETECTED Final   Carba Resistance OXA48 Gene NOT DETECTED NOT DETECTED Final    Comment: (NOTE) Cepheid Carba-R is an FDA-cleared nucleic acid amplification test  (NAAT)for the detection and differentiation of genes encoding the  most prevalent carbapenemases in bacterial isolate samples. Carbapenemase gene identification and implementation of comprehensive  infection control measures are recommended by the CDC to prevent the  spread of the resistant organisms. Performed at [facility], [place] [address], [place], [place] [address]   Culture, blood (routine x 2)     Status: None   Collection Time: 11/18/19  2:57 PM   Specimen: BLOOD LEFT HAND  Result Value Ref Range Status   Specimen Description BLOOD LEFT HAND  Final   Special Requests   Final    BOTTLES DRAWN AEROBIC AND ANAEROBIC Blood Culture adequate volume   Culture   Final    NO GROWTH 5 DAYS Performed at [facility], [place] [address]  [place], [place] [address]    Report Status 11/23/2019 FINAL  Final  Culture, blood (routine x 2)     Status: None   Collection Time: 11/18/19  3:04 PM   Specimen: BLOOD RIGHT HAND  Result Value Ref Range Status   Specimen Description BLOOD RIGHT HAND  Final   Special Requests   Final    BOTTLES DRAWN AEROBIC AND ANAEROBIC Blood Culture adequate volume   Culture   Final    NO GROWTH 5 DAYS Performed at [facility], [place] [address] [place], [place] [address]    Report Status 11/23/2019 FINAL  Final  SARS CORONAVIRUS 2 (TAT 6-24 HRS) Nasopharyngeal Nasopharyngeal Swab     Status: None   Collection Time: 11/25/19  9:58 AM   Specimen: Nasopharyngeal Swab  Result Value Ref Range Status   SARS Coronavirus 2 NEGATIVE NEGATIVE Final    Comment: (NOTE)  SARS-CoV-2 target nucleic acids are NOT DETECTED. The SARS-CoV-2 RNA is generally detectable in upper and lower respiratory specimens during the acute phase of infection. Negative results do not preclude SARS-CoV-2 infection, do not rule out co-infections with other pathogens, and should not be used as the sole basis for treatment or other patient management decisions. Negative results must be combined with clinical observations, patient history, and epidemiological information. The expected result is Negative. Fact Sheet for Patients: SugarRoll.be Fact Sheet for Healthcare Providers: https://www.woods-mathews.com/ This test is not yet approved or cleared by the [place] FDA and  has been authorized for detection and/or diagnosis of SARS-CoV-2 by FDA under an Emergency Use Authorization (EUA). This EUA will remain  in effect (meaning this test can be used) for the duration of the COVID-19 declaration under Section 56 4(b)(1) of the Act, 21 U.S.C. section 360bbb-3(b)(1), unless the authorization is terminated or revoked sooner. Performed at [facility], [place] [address],  [place], [place] [address]      Labs: Basic Metabolic Panel: Recent Labs  Lab 11/21/19 0423 11/22/19 0357 11/24/19 0341 11/25/19 0355  NA 141 141 136 140  K 3.7 4.8 3.6 3.8  CL 103 105 99 101  CO2 28 29 28 28   GLUCOSE 107* 117* 105* 100*  BUN 6 9 10 13   CREATININE 1.16* 1.17* 1.21* 1.16*  CALCIUM 10.2 9.9 9.8 10.3   Liver Function Tests: No results for input(s): AST, ALT, ALKPHOS, BILITOT, PROT, ALBUMIN in the last 168 hours. No results for input(s): LIPASE, AMYLASE in the last 168 hours. No results for input(s): AMMONIA in the last 168 hours. CBC: No results for input(s): WBC, NEUTROABS, HGB, HCT, MCV, PLT in the last 168 hours. Cardiac Enzymes: No results for input(s): CKTOTAL, CKMB, CKMBINDEX, TROPONINI in the last 168 hours. BNP: BNP (last 3 results) No results for input(s): BNP in the last 8760 hours.  ProBNP (last 3 results) No results for input(s): PROBNP in the last 8760 hours.  CBG: Recent Labs  Lab 11/23/19 2135  GLUCAP 115*       Signed:  Nita Sells MD   [facility] 11/27/2019, 2:49 PM

## 2019-12-02 ENCOUNTER — Ambulatory Visit: Payer: Medicare Other

## 2019-12-05 ENCOUNTER — Ambulatory Visit
Admission: RE | Admit: 2019-12-05 | Discharge: 2019-12-05 | Disposition: A | Discharge status: Home / Self Care | Payer: Medicare Other | Source: Ambulatory Visit | Attending: Internal Medicine | Admitting: Internal Medicine

## 2019-12-05 ENCOUNTER — Ambulatory Visit: Payer: Medicare Other

## 2019-12-05 ENCOUNTER — Other Ambulatory Visit: Payer: Self-pay

## 2019-12-05 DIAGNOSIS — Z1231 Encounter for screening mammogram for malignant neoplasm of breast: Secondary | ICD-10-CM

## 2019-12-22 ENCOUNTER — Other Ambulatory Visit: Payer: Self-pay

## 2019-12-22 ENCOUNTER — Emergency Department (HOSPITAL_COMMUNITY)
Admission: EM | Admit: 2019-12-22 | Discharge: 2019-12-23 | Disposition: A | Discharge status: Other Acute Inpatient Hospital | Payer: Medicare Other | Attending: Emergency Medicine | Admitting: Emergency Medicine

## 2019-12-22 DIAGNOSIS — Z20822 Contact with and (suspected) exposure to covid-19: Secondary | ICD-10-CM | POA: Insufficient documentation

## 2019-12-22 DIAGNOSIS — Z046 Encounter for general psychiatric examination, requested by authority: Secondary | ICD-10-CM | POA: Insufficient documentation

## 2019-12-22 DIAGNOSIS — F309 Manic episode, unspecified: Secondary | ICD-10-CM

## 2019-12-22 DIAGNOSIS — F259 Schizoaffective disorder, unspecified: Secondary | ICD-10-CM | POA: Insufficient documentation

## 2019-12-22 DIAGNOSIS — F312 Bipolar disorder, current episode manic severe with psychotic features: Secondary | ICD-10-CM

## 2019-12-22 DIAGNOSIS — Z79899 Other long term (current) drug therapy: Secondary | ICD-10-CM | POA: Insufficient documentation

## 2019-12-22 DIAGNOSIS — I10 Essential (primary) hypertension: Secondary | ICD-10-CM | POA: Insufficient documentation

## 2019-12-22 LAB — COMPREHENSIVE METABOLIC PANEL
ALT: 16 U/L (ref 0–44)
AST: 14 U/L — ABNORMAL LOW (ref 15–41)
Albumin: 3.9 g/dL (ref 3.5–5.0)
Alkaline Phosphatase: 62 U/L (ref 38–126)
Anion gap: 8 (ref 5–15)
BUN: 11 mg/dL (ref 6–20)
CO2: 27 mmol/L (ref 22–32)
Calcium: 9.8 mg/dL (ref 8.9–10.3)
Chloride: 110 mmol/L (ref 98–111)
Creatinine, Ser: 0.9 mg/dL (ref 0.44–1.00)
GFR calc Af Amer: >60 mL/min (ref >60)
GFR calc non Af Amer: >60 mL/min (ref >60)
Glucose, Bld: 110 mg/dL — ABNORMAL HIGH (ref 70–99)
Potassium: 3 mmol/L — ABNORMAL LOW (ref 3.5–5.1)
Sodium: 145 mmol/L (ref 135–145)
Total Bilirubin: 0.3 mg/dL (ref 0.3–1.2)
Total Protein: 6.4 g/dL — ABNORMAL LOW (ref 6.5–8.1)

## 2019-12-22 LAB — CBC WITH DIFFERENTIAL/PLATELET
Abs Immature Granulocytes: 0.01 10*3/uL (ref 0.00–0.07)
Basophils Absolute: 0 10*3/uL (ref 0.0–0.1)
Basophils Relative: 1 %
Eosinophils Absolute: 0.1 10*3/uL (ref 0.0–0.5)
Eosinophils Relative: 1 %
HCT: 38 % (ref 36.0–46.0)
Hemoglobin: 12.1 g/dL (ref 12.0–15.0)
Immature Granulocytes: 0 %
Lymphocytes Relative: 40 %
Lymphs Abs: 2.1 10*3/uL (ref 0.7–4.0)
MCH: 28.2 pg (ref 26.0–34.0)
MCHC: 31.8 g/dL (ref 30.0–36.0)
MCV: 88.6 fL (ref 80.0–100.0)
Monocytes Absolute: 0.5 10*3/uL (ref 0.1–1.0)
Monocytes Relative: 10 %
Neutro Abs: 2.5 10*3/uL (ref 1.7–7.7)
Neutrophils Relative %: 48 %
Platelets: 208 10*3/uL (ref 150–400)
RBC: 4.29 MIL/uL (ref 3.87–5.11)
RDW: 13.2 % (ref 11.5–15.5)
WBC: 5.2 10*3/uL (ref 4.0–10.5)
nRBC: 0 % (ref 0.0–0.2)

## 2019-12-22 LAB — RESPIRATORY PANEL BY RT PCR (FLU A&B, COVID)
Influenza A by PCR: NEGATIVE
Influenza B by PCR: NEGATIVE
SARS Coronavirus 2 by RT PCR: NEGATIVE

## 2019-12-22 LAB — ETHANOL: Alcohol, Ethyl (B): <10 mg/dL (ref <10)

## 2019-12-22 LAB — TROPONIN I (HIGH SENSITIVITY): Troponin I (High Sensitivity): 3 ng/L (ref <18)

## 2019-12-22 MED ORDER — STERILE WATER FOR INJECTION IJ SOLN
INTRAMUSCULAR | Status: AC
  Filled 2019-12-22: qty 10

## 2019-12-22 MED ORDER — LAMOTRIGINE 25 MG PO TABS
50.0000 mg | ORAL_TABLET | Freq: Two times a day (BID) | ORAL | Status: DC
  Administered 2019-12-22 – 2019-12-23 (×2): 50 mg via ORAL
  Filled 2019-12-22 (×2): qty 2

## 2019-12-22 MED ORDER — AMLODIPINE BESYLATE 5 MG PO TABS
5.0000 mg | ORAL_TABLET | Freq: Every day | ORAL | Status: DC
  Administered 2019-12-23: 5 mg via ORAL
  Filled 2019-12-22: qty 1

## 2019-12-22 MED ORDER — OLANZAPINE 10 MG IM SOLR
5.0000 mg | Freq: Once | INTRAMUSCULAR | Status: AC
  Administered 2019-12-22: 18:00:00 5 mg via INTRAMUSCULAR
  Filled 2019-12-22: qty 10

## 2019-12-22 MED ORDER — POTASSIUM CHLORIDE CRYS ER 20 MEQ PO TBCR
40.0000 meq | EXTENDED_RELEASE_TABLET | Freq: Once | ORAL | Status: AC
  Administered 2019-12-22: 19:00:00 40 meq via ORAL
  Filled 2019-12-22: qty 2

## 2019-12-22 MED ORDER — CARVEDILOL 25 MG PO TABS
25.0000 mg | ORAL_TABLET | Freq: Two times a day (BID) | ORAL | Status: DC
  Administered 2019-12-22 – 2019-12-23 (×2): 25 mg via ORAL
  Filled 2019-12-22 (×5): qty 1

## 2019-12-22 MED ORDER — BENZTROPINE MESYLATE 1 MG PO TABS
2.0000 mg | ORAL_TABLET | Freq: Two times a day (BID) | ORAL | Status: DC
  Administered 2019-12-22 – 2019-12-23 (×2): 2 mg via ORAL
  Filled 2019-12-22 (×2): qty 2

## 2019-12-22 MED ORDER — ASPIRIN 81 MG PO CHEW
324.0000 mg | CHEWABLE_TABLET | Freq: Once | ORAL | Status: AC
  Administered 2019-12-22: 324 mg via ORAL
  Filled 2019-12-22: qty 4

## 2019-12-22 MED ORDER — LORAZEPAM 2 MG/ML IJ SOLN
1.0000 mg | Freq: Once | INTRAMUSCULAR | Status: AC
  Administered 2019-12-22: 13:00:00 1 mg via INTRAMUSCULAR
  Filled 2019-12-22: qty 1

## 2019-12-22 MED ORDER — HALOPERIDOL 5 MG PO TABS
20.0000 mg | ORAL_TABLET | Freq: Every day | ORAL | Status: DC
  Administered 2019-12-22: 20 mg via ORAL
  Filled 2019-12-22: qty 4

## 2019-12-22 MED ORDER — ZIPRASIDONE MESYLATE 20 MG IM SOLR
20.0000 mg | Freq: Once | INTRAMUSCULAR | Status: AC
  Administered 2019-12-22: 21:00:00 20 mg via INTRAMUSCULAR
  Filled 2019-12-22: qty 20

## 2019-12-22 MED ORDER — TEMAZEPAM 15 MG PO CAPS
30.0000 mg | ORAL_CAPSULE | Freq: Every day | ORAL | Status: DC
  Administered 2019-12-22: 22:00:00 30 mg via ORAL
  Filled 2019-12-22: qty 2

## 2019-12-22 MED ORDER — ZIPRASIDONE MESYLATE 20 MG IM SOLR
20.0000 mg | Freq: Once | INTRAMUSCULAR | Status: AC
  Administered 2019-12-22: 13:00:00 20 mg via INTRAMUSCULAR
  Filled 2019-12-22: qty 20

## 2019-12-22 NOTE — ED Notes (Signed)
TTS machine at bedside. 

## 2019-12-22 NOTE — ED Notes (Signed)
While having consult with TTS, pt because verbally aggressive and loud, yelling and screaming. Security was called, pt was given ordered geodon and was COVID swabbed with security at bedside.

## 2019-12-22 NOTE — BH Assessment (Signed)
[facility] Assessment Progress Note This writer attempted to assess at 1400 hours unsuccessfully as patient was noted to have been medicated and sleeping.

## 2019-12-22 NOTE — ED Notes (Signed)
This RN attempted to get blood collection for lab work, provide pt with medications as ordered, and swab pt for COVID.  Pt adamantly refusing any interventions.  Pt tangenital in speech, citing "Jesus is my doctor, I am already healed."

## 2019-12-22 NOTE — ED Provider Notes (Signed)
[facility] DEPT Provider Note   CSN: [id number] Arrival date & time: 12/22/19  1247     History Chief Complaint  Patient presents with  . Agitation    Off meds/phych patient    Paula Dickson is a 54 y.o. female with a past medical history of bipolar and schizophrenia with frequent psychiatric hospitalizations.  The patient has a history of lithium toxicity and was hospitalized back in December.  She was taken off of her medications placed on Haldol and Lamictal.  Patient was involuntarily committed by her sister today.  IVC report states that the patient has not been taking her medications.  She is not been sleeping, eating, tending to personal hygiene.  She has been calling family members 10-15 times a day.  She is hallucinating and thinks that her parents are alive and that she has been cooking breakfast for them.  She is also been threatening to burn her house down with herself inside.  Patient is overtly psychotic.  I am unable to obtain history from the patient there is therefore a level 5 caveat.  Patient arrives with multiple Sheriff's deputies and security officers from the ER present.  HPI     Past Medical History:  Diagnosis Date  . Bipolar affective disorder ([place])   . History of arthritis   . History of chicken pox   . History of depression   . History of genital warts   . history of heart murmur   . History of high blood pressure   . History of thyroid disease   . History of UTI   . Hypertension   . Low TSH level 07/13/2017  . Schizophrenia [facility])     Patient Active Problem List   Diagnosis Date Noted  . Acute metabolic encephalopathy [id number]  . AMS (altered mental status) 11/17/2019  . Lithium toxicity 11/17/2019  . Hyperthyroidism 10/14/2019  . Medication side effect, initial encounter   . Schizoaffective disorder ([place]) 03/13/2019  . Severe mixed bipolar 1 disorder without psychosis ([place]) 03/13/2019  . Bipolar  affective disorder, current episode mixed ([place]) 11/01/2018  . Toxic encephalopathy 07/14/2017  . Suicide attempt ([place]) 07/13/2017  . Low TSH level 07/13/2017  . Overdose of psychotropic 07/12/2017  . Valproic acid toxicity 07/12/2017  . Hyperammonemia ([place]) 07/12/2017  . Schizophrenia ([place]) 07/12/2017  . Bipolar disorder, most recent episode depressed ([place]) 07/12/2017  . Insomnia 12/27/2015  . Abnormal urinalysis 12/27/2015  . 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 Bipolar I disorder, most recent episode manic, severe with psychotic features ([place]) 06/14/2015    Past Surgical History:  Procedure Laterality Date  . ABLATION ON ENDOMETRIOSIS    . CYST REMOVAL NECK     around 11 years ago /benign  . MULTIPLE TOOTH EXTRACTIONS       OB History   No obstetric history on file.     Family History  Problem Relation Age of Onset  . Arthritis Father   . Hyperlipidemia Father   . High blood pressure Father   . Diabetes Sister   . Diabetes Mother   . Diabetes Brother   . Mental illness Brother   . Alcohol abuse Paternal Uncle   . Alcohol abuse Paternal Grandfather   . Breast cancer Maternal Aunt   . Breast cancer Paternal Aunt   . High blood pressure Sister   . Mental illness Other        runs in family    Social History   Tobacco Use  .  Smoking status: Never Smoker  . Smokeless tobacco: Never Used  Substance Use Topics  . Alcohol use: No  . Drug use: No    Home Medications Prior to Admission medications   Medication Sig Start Date End Date Taking? Authorizing Provider  amLODipine (NORVASC) 5 MG tablet Take 1 tablet (5 mg total) by mouth daily. 11/27/19   Nita Sells, MD  benztropine (COGENTIN) 2 MG tablet Take 1 tablet (2 mg total) by mouth 2 (two) times daily. 11/27/19   Nita Sells, MD  carvedilol (COREG) 25 MG tablet Take 1 tablet (25 mg total) by mouth 2 (two) times daily with a meal. 04/01/19   Johnn Hai, MD  haloperidol (HALDOL) 10 MG tablet  Take 2 tablets (20 mg total) by mouth at bedtime. 11/27/19   Nita Sells, MD  lamoTRIgine (LAMICTAL) 25 MG tablet Take 2 tablets (50 mg total) by mouth 2 (two) times daily. 11/27/19   Nita Sells, MD  temazepam (RESTORIL) 30 MG capsule Take 1 capsule (30 mg total) by mouth at bedtime. 11/27/19   Nita Sells, MD    Allergies    Patient has no known allergies.  Review of Systems   Review of Systems Ten systems reviewed and are negative for acute change, except as noted in the HPI.   Physical Exam Updated Vital Signs BP (!) 141/90 (BP Location: Right Arm)   Pulse 73   Temp 98.6 F (37 C) (Oral)   Resp 18   SpO2 95%   Physical Exam Vitals and nursing note reviewed.  Constitutional:      General: She is not in acute distress.    Appearance: She is well-developed. She is not diaphoretic.  HENT:     Head: Normocephalic and atraumatic.  Eyes:     General: No scleral icterus.    Conjunctiva/sclera: Conjunctivae normal.  Cardiovascular:     Rate and Rhythm: Normal rate and regular rhythm.     Heart sounds: Normal heart sounds. No murmur. No friction rub. No gallop.   Pulmonary:     Effort: Pulmonary effort is normal. No respiratory distress.     Breath sounds: Normal breath sounds.  Abdominal:     General: Bowel sounds are normal. There is no distension.     Palpations: Abdomen is soft. There is no mass.     Tenderness: There is no abdominal tenderness. There is no guarding.  Musculoskeletal:     Cervical back: Normal range of motion.  Skin:    General: Skin is warm and dry.  Neurological:     Mental Status: She is alert and oriented to person, place, and time.  Psychiatric:        Attention and Perception: She is inattentive.        Mood and Affect: Affect is angry and inappropriate.        Speech: Speech is rapid and pressured and tangential.        Behavior: Behavior is agitated and combative.        Thought Content: Thought content is  delusional.     Comments: Hyperreligious     ED Results / Procedures / Treatments   Labs (all labs ordered are listed, but only abnormal results are displayed) Labs Reviewed  COMPREHENSIVE METABOLIC PANEL - Abnormal; Notable for the following components:      Result Value   Potassium 3.0 (*)    Glucose, Bld 110 (*)    Total Protein 6.4 (*)    AST 14 (*)  All other components within normal limits  RESPIRATORY PANEL BY RT PCR (FLU A&B, COVID)  ETHANOL  CBC WITH DIFFERENTIAL/PLATELET  RAPID URINE DRUG SCREEN, HOSP PERFORMED    EKG None  Radiology No results found.  Procedures Procedures (including critical care time)  Medications Ordered in ED Medications  OLANZapine (ZYPREXA) injection 5 mg (0 mg Intramuscular Hold 12/22/19 1452)  sterile water (preservative free) injection (has no administration in time range)  sterile water (preservative free) injection (has no administration in time range)  amLODipine (NORVASC) tablet 5 mg (has no administration in time range)  benztropine (COGENTIN) tablet 2 mg (has no administration in time range)  carvedilol (COREG) tablet 25 mg (has no administration in time range)  haloperidol (HALDOL) tablet 20 mg (has no administration in time range)  lamoTRIgine (LAMICTAL) tablet 50 mg (has no administration in time range)  temazepam (RESTORIL) capsule 30 mg (has no administration in time range)  potassium chloride SA (KLOR-CON) CR tablet 40 mEq (has no administration in time range)  ziprasidone (GEODON) injection 20 mg (20 mg Intramuscular Given 12/22/19 1319)  LORazepam (ATIVAN) injection 1 mg (1 mg Intramuscular Given 12/22/19 1319)    ED Course  I have reviewed the triage vital signs and the nursing notes.  Pertinent labs & imaging results that were available during my care of the patient were reviewed by me and considered in my medical decision making (see chart for details).    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 Otherwise medically clear Rx / DC Orders ED Discharge Orders    None       Margarita Mail, PA-C 12/22/19 1725    Ezequiel Essex, MD 12/22/19 1944

## 2019-12-22 NOTE — ED Notes (Signed)
With help of Secretary, Paula Dickson, pt allowed this RN to draw blood work and administer medications.

## 2019-12-22 NOTE — ED Provider Notes (Signed)
Update note  Summary:  54 year old lady past medical history bipolar and schizophrenia, no known coronary artery disease history presents to ER with increased agitation.  IVC.  EKG obtained as part of initial routine screening for psych eval.  EKG with concern for subtle ST elevations in 1, reciprocal depressions in V2, V3.  I contacted Dr. Vladimir Faster. Reviewed in detail.  He reviewed today's ekg and prior in December - similar morphology, though he also notes these ST segment changes.  In the absence of any ongoing chest pain or recent chest pain, he recommends against calling STEMI alert.  Recommend cycling enzymes and monitoring patient further.  Plan:  Cardiac monitoring Check troponin x2 Will give ASA 324 mg chewable Monitor closely for any symptoms Zyprexa as needed for agitation   Lucrezia Starch, MD 12/22/19 1732

## 2019-12-22 NOTE — ED Triage Notes (Signed)
Pt here with [employer] after being IVC's by family for agitation.  Pt has been screaming since the [place] arrived.  She arrived loud and in handcuffs.  Pt was not physically aggressive.

## 2019-12-22 NOTE — BH Assessment (Addendum)
Tele Assessment Note   Patient Name: Paula Dickson MRN: [id number] Referring Physician: Margarita Mail, PA-C Location of Patient: [facility] Location of Provider: [place]  Horald Chestnut Willhelm is a 54 y.o. female who was brought to [facility] via the [facility] under an IVC that was completed by her sister. According to nursing notes, pt was screaming, loud, and in handcuffs when she arrived with the [facility], though it was noted that she was *not* physically aggressive. The IVC states:   "[name] states: The respondent has been diagnosed with bipolar/schizophrenia disorder. The respondent has not been eating, sleeping, or tending to her personal hygiene. The respondent refuses to take her medication, she doesn't believe she needs treatment. The respondent has been calling her brother stating she is going to burn down her house and sit in the home to watch it burn. The respondent is hallucinating, she believes her deceased parents are still living and claims she cooks dinner for them every day. The respondent has grown aggressive; she [curses] individuals out, [pushes] them, [calls] them names, and [threatens] to harm them (this includes petitioner). The respondent calls petitioner, among others, 10 to 15 times a day. The respondent has fallen several times but [refuses] to let the physical therapist in the home for her sessions. The respondent has been known to laugh, scream, and cry all within a very short period of time."  Pt states she has been experiencing "too much." Clinician prompted for pt to elaborate on this, and pt shared that she had asked a lady to make some pictures but the lady is now deceased. She also noted she spends time with another lady who is developmentally delayed, though she enjoys the time they spend together. Clinician requested pt share what occurred to result in her being brought to the hospital tonight. Pt stated that clinician  didn't listen and that she missed the point. Clinician suggested they continue with the assessment, as the questions were primarily "yes/no" answers, so pt wouldn't have to provide information other than that.  Pt denied she is currently experiencing SI, though she acknowledges she has experienced SI in the past and that she has attempted to kill herself in the past; she stated she can't remember when this incident took place. Pt states she has been hospitalized in the past, though she states she cannot remember when or where these hospitalizations occurred (though pt later stated she went to [facility] in the past, and she has past hx of being admitted to [facility]). Pt denies she has a plan to kill herself. Pt denies a current or prior hx of HI.  At this point in the assessment, pt became frustrated with the questions and asked to see what clinician was writing. Clinician held her paper up to the camera in an effort to allow pt to read what she had written, and pt tried to read it but was unable; pt then reached for the paper and told clinician to hand the paper to her. Clinician explained she was in a different room and that she was unable to hand the paper to her. Pt again told clinician to give her the paper so she could read it and clinician explained that she was on the tv and could not hand her things. Pt then expressed an understanding and stated she forgot and thought clinician was sitting down.  Pt again became upset and was yelling at clinician, making statements about the hospital keeping her here forever, [place]  killing her, and [place] putting her in the incinerator. Despite clinician's attempts to talk to pt, she was unwilling/unable to be rationalized with and continued to argue and yell. Clinician ended the assessment, as pt was unwilling to complete any more of it and attempting to do so would have escalated her further.  Pt is oriented x2; she was unsure of the date (she thought it was mid-February  or possibly around the 30th of January, 2021) and she could/would not provide information regarding why she was at the hospital; MSE was essentially UTA. Pt's recent and remote memory was UTA. Pt was irritable and argumentative throughout the assessment process. Pt's insight, judgement, and impulse control is fair - poor at this time.    Diagnosis: F20.9, Schizophrenia   Past Medical History:  Past Medical History:  Diagnosis Date  . Bipolar affective disorder ([place])   . History of arthritis   . History of chicken pox   . History of depression   . History of genital warts   . history of heart murmur   . History of high blood pressure   . History of thyroid disease   . History of UTI   . Hypertension   . Low TSH level 07/13/2017  . Schizophrenia [facility])     Past Surgical History:  Procedure Laterality Date  . ABLATION ON ENDOMETRIOSIS    . CYST REMOVAL NECK     around 11 years ago /benign  . MULTIPLE TOOTH EXTRACTIONS      Family History:  Family History  Problem Relation Age of Onset  . Arthritis Father   . Hyperlipidemia Father   . High blood pressure Father   . Diabetes Sister   . Diabetes Mother   . Diabetes Brother   . Mental illness Brother   . Alcohol abuse Paternal Uncle   . Alcohol abuse Paternal Grandfather   . Breast cancer Maternal Aunt   . Breast cancer Paternal Aunt   . High blood pressure Sister   . Mental illness Other        runs in family    Social History:  reports that she has never smoked. She has never used smokeless tobacco. She reports that she does not drink alcohol or use drugs.  Additional Social History:  Alcohol / Drug Use Pain Medications: Please see MAR Prescriptions: Please see MAR Over the Counter: Please see MAR History of alcohol / drug use?: (UTA) Longest period of sobriety (when/how long): UTA  CIWA: CIWA-Ar BP: (!) 159/103 Pulse Rate: 85 COWS:    Allergies: No Known Allergies  Home Medications: (Not in a hospital  admission)   OB/GYN Status:  No LMP recorded. (Menstrual status: Perimenopausal).  General Assessment Data Assessment unable to be completed: Yes Reason for not completing assessment: Pt was medicated on arrival  [facility] of Assessment: [facility] TTS Assessment: In system Is this a Tele or Face-to-Face Assessment?: Tele Assessment Is this an Initial Assessment or a Re-assessment for this encounter?: Initial Assessment Patient Accompanied by:: N/A Language Other than English: No Living Arrangements: Other (Comment)(Pt lives in a home) What gender do you identify as?: Female Marital status: Other (comment)(UTA) Maiden name: UTA Pregnancy Status: No Living Arrangements: Other (Comment)(UTA) Can pt return to current living arrangement?: (UTA) Admission Status: Involuntary [name]: Family member Is patient capable of signing voluntary admission?: No Referral Source: Self/Family/Friend Insurance type: Medicare     Crisis Care Plan Living Arrangements: Other (Comment)(UTA) Legal Guardian: (UTA) Name of Psychiatrist: [place] Name of  Therapist: UTA  Education Status Is patient currently in school?: No Is the patient employed, unemployed or receiving disability?: Receiving disability income  Risk to self with the past 6 months Suicidal Ideation: Yes-Currently Present Has patient been a risk to self within the past 6 months prior to admission? : No Suicidal Intent: Yes-Currently Present Has patient had any suicidal intent within the past 6 months prior to admission? : No Is patient at risk for suicide?: Yes Suicidal Plan?: No Has patient had any suicidal plan within the past 6 months prior to admission? : No Access to Means: No What has been your use of drugs/alcohol within the last 12 months?: UTA Previous Attempts/Gestures: Yes How many times?: 1 Other Self Harm Risks: Pt stopped taking her medication, has been delusional Triggers for Past Attempts: Unknown Intentional Self  Injurious Behavior: (UTA) Family Suicide History: Unable to assess Recent stressful life event(s): Other (Comment)(UTA) Persecutory voices/beliefs?: No Depression: No Depression Symptoms: Loss of interest in usual pleasures, Feeling angry/irritable Substance abuse history and/or treatment for substance abuse?: (UTA) Suicide prevention information given to non-admitted patients: Not applicable  Risk to Others within the past 6 months Homicidal Ideation: No Does patient have any lifetime risk of violence toward others beyond the six months prior to admission? : No Thoughts of Harm to Others: No Current Homicidal Intent: No Current Homicidal Plan: No Access to Homicidal Means: No Identified Victim: None noted History of harm to others?: No Assessment of Violence: None Noted Violent Behavior Description: None noted Does patient have access to weapons?: (UTA) Criminal Charges Pending?: No Does patient have a court date: No Is patient on probation?: No  Psychosis Hallucinations: Auditory, Visual Delusions: (Pt believes her parents are still alive)  Mental Status Report Appearance/Hygiene: Disheveled Eye Contact: Fair Motor Activity: Agitation Speech: Loud, Other (Comment), Rapid(Pt was argumentative) Level of Consciousness: Alert, Irritable Mood: Angry, Irritable Affect: Irritable Anxiety Level: Minimal Thought Processes: Coherent, Flight of Ideas Judgement: Impaired Orientation: Person, Place, Unable to assess Obsessive Compulsive Thoughts/Behaviors: Moderate  Cognitive Functioning Concentration: Poor Memory: Unable to Assess Is patient IDD: No Insight: Poor Impulse Control: Unable to Assess Appetite: (UTA) Have you had any weight changes? : (UTA) Sleep: Unable to Assess Total Hours of Sleep: (UTA) Vegetative Symptoms: Unable to Assess  ADLScreening [facility] Assessment Services) Patient's cognitive ability adequate to safely complete daily activities?: (UTA) Patient  able to express need for assistance with ADLs?: (UTA) Independently performs ADLs?: (UTA)  Prior Inpatient Therapy Prior Inpatient Therapy: Yes Prior Therapy Dates: Multiple Prior Therapy Facilty/Provider(s): [facility], [place] Reason for Treatment: Bipolar Disorder, Schizophrenia  Prior Outpatient Therapy Prior Outpatient Therapy: (UTA)  ADL Screening (condition at time of admission) Patient's cognitive ability adequate to safely complete daily activities?: (UTA) Is the patient deaf or have difficulty hearing?: (UTA) Does the patient have difficulty seeing, even when wearing glasses/contacts?: (UTA) Does the patient have difficulty concentrating, remembering, or making decisions?: (UTA) Patient able to express need for assistance with ADLs?: (UTA) Does the patient have difficulty dressing or bathing?: (UTA) Independently performs ADLs?: (UTA) Does the patient have difficulty walking or climbing stairs?: (UTA) Weakness of Legs: (UTA) Weakness of Arms/Hands: (UTA)  Home Assistive Devices/Equipment Home Assistive Devices/Equipment: (UTA)  Therapy Consults (therapy consults require a physician order) PT Evaluation Needed: (UTA) OT Evalulation Needed: (UTA) SLP Evaluation Needed: (UTA)   Values / Beliefs Cultural Requests During Hospitalization: (UTA) Spiritual Requests During Hospitalization: (UTA) Consults Spiritual Care Consult Needed: (UTA) Transition of Care Team Consult Needed: (  UTA) Advance Directives (For Healthcare) Does Patient Have a Medical Advance Directive?: Unable to assess, patient is non-responsive or altered mental status          Disposition: Adaku Anike, NP, reviewed pt's chart and information and determined pt should be observed overnight for safety and stability and re-assessed by psychiatry in the morning. This information was provided to pt's nurse, Cassandra RN, at 2147.   Disposition Initial Assessment Completed for this Encounter:  Yes  This service was provided via telemedicine using a 2-way, interactive audio and video technology.  Names of all persons participating in this telemedicine service and their role in this encounter. Name: Paula Dickson Role: Patient  Name: Talbot Grumbling Role: Nurse Practitioner  Name: Windell Hummingbird Role: Clinician    Dannielle Burn 12/22/2019 9:10 PM

## 2019-12-22 NOTE — ED Notes (Signed)
2200 meds placed on hold due to pt being overly drowsy from geodon injection.

## 2019-12-22 NOTE — ED Notes (Signed)
Pt dressed out into burgundy scrubs and belongings tagged and place in cabinet labeled "Patient Belongings 22-25".

## 2019-12-22 NOTE — ED Notes (Signed)
Soft restraints were removed as patient is responding well to prn.  She is sleeping soundly at this time.  Sitter at bedside.

## 2019-12-23 ENCOUNTER — Encounter (HOSPITAL_COMMUNITY): Payer: Self-pay | Admitting: Registered Nurse

## 2019-12-23 ENCOUNTER — Inpatient Hospital Stay (HOSPITAL_COMMUNITY)
Admission: AD | Admit: 2019-12-23 | Discharge: 2019-12-31 | DRG: 885 | Disposition: A | Discharge status: Home / Self Care | Payer: Medicare Other | Attending: Psychiatry | Admitting: Psychiatry

## 2019-12-23 DIAGNOSIS — Z9119 Patient's noncompliance with other medical treatment and regimen: Secondary | ICD-10-CM | POA: Diagnosis not present

## 2019-12-23 DIAGNOSIS — R296 Repeated falls: Secondary | ICD-10-CM | POA: Diagnosis present

## 2019-12-23 DIAGNOSIS — F312 Bipolar disorder, current episode manic severe with psychotic features: Secondary | ICD-10-CM | POA: Diagnosis present

## 2019-12-23 DIAGNOSIS — Z9114 Patient's other noncompliance with medication regimen: Secondary | ICD-10-CM

## 2019-12-23 DIAGNOSIS — Z818 Family history of other mental and behavioral disorders: Secondary | ICD-10-CM | POA: Diagnosis not present

## 2019-12-23 DIAGNOSIS — I10 Essential (primary) hypertension: Secondary | ICD-10-CM | POA: Diagnosis present

## 2019-12-23 DIAGNOSIS — Z20822 Contact with and (suspected) exposure to covid-19: Secondary | ICD-10-CM | POA: Diagnosis present

## 2019-12-23 DIAGNOSIS — G4700 Insomnia, unspecified: Secondary | ICD-10-CM | POA: Diagnosis present

## 2019-12-23 LAB — URINALYSIS, ROUTINE W REFLEX MICROSCOPIC
Bilirubin Urine: NEGATIVE
Glucose, UA: NEGATIVE mg/dL
Hgb urine dipstick: NEGATIVE
Ketones, ur: NEGATIVE mg/dL
Leukocytes,Ua: NEGATIVE
Nitrite: NEGATIVE
Protein, ur: NEGATIVE mg/dL
Specific Gravity, Urine: 1.008 (ref 1.005–1.030)
pH: 6 (ref 5.0–8.0)

## 2019-12-23 LAB — RAPID URINE DRUG SCREEN, HOSP PERFORMED
Amphetamines: NOT DETECTED
Barbiturates: NOT DETECTED
Benzodiazepines: POSITIVE — AB
Cocaine: NOT DETECTED
Opiates: NOT DETECTED
Tetrahydrocannabinol: NOT DETECTED

## 2019-12-23 LAB — TROPONIN I (HIGH SENSITIVITY): Troponin I (High Sensitivity): 5 ng/L (ref <18)

## 2019-12-23 LAB — LIPID PANEL
Cholesterol: 120 mg/dL (ref 0–200)
HDL: 40 mg/dL — ABNORMAL LOW (ref >40)
LDL Cholesterol: 60 mg/dL (ref 0–99)
Total CHOL/HDL Ratio: 3 RATIO
Triglycerides: 102 mg/dL (ref <150)
VLDL: 20 mg/dL (ref 0–40)

## 2019-12-23 LAB — TSH: TSH: 0.02 u[IU]/mL — ABNORMAL LOW (ref 0.350–4.500)

## 2019-12-23 MED ORDER — LORAZEPAM 2 MG/ML IJ SOLN
2.0000 mg | Freq: Once | INTRAMUSCULAR | Status: AC
  Administered 2019-12-23: 2 mg via INTRAMUSCULAR
  Filled 2019-12-23: qty 1

## 2019-12-23 MED ORDER — LAMOTRIGINE 25 MG PO TABS
50.0000 mg | ORAL_TABLET | Freq: Two times a day (BID) | ORAL | Status: DC
  Filled 2019-12-23 (×4): qty 2

## 2019-12-23 MED ORDER — ZIPRASIDONE MESYLATE 20 MG IM SOLR: 20.0000 mg | Freq: Two times a day (BID) | INTRAMUSCULAR | Status: DC | PRN

## 2019-12-23 MED ORDER — MAGNESIUM HYDROXIDE 400 MG/5ML PO SUSP: 30.0000 mL | Freq: Every day | ORAL | Status: DC | PRN

## 2019-12-23 MED ORDER — DIPHENHYDRAMINE HCL 50 MG/ML IJ SOLN
50.0000 mg | Freq: Once | INTRAMUSCULAR | Status: AC
  Filled 2019-12-23: qty 1

## 2019-12-23 MED ORDER — ACETAMINOPHEN 325 MG PO TABS: 650.0000 mg | ORAL_TABLET | Freq: Four times a day (QID) | ORAL | Status: DC | PRN

## 2019-12-23 MED ORDER — LORAZEPAM 2 MG/ML IJ SOLN
2.0000 mg | Freq: Four times a day (QID) | INTRAMUSCULAR | Status: DC
  Administered 2019-12-24 – 2019-12-25 (×4): 2 mg via INTRAMUSCULAR
  Filled 2019-12-23 (×4): qty 1

## 2019-12-23 MED ORDER — HALOPERIDOL LACTATE 5 MG/ML IJ SOLN
5.0000 mg | Freq: Four times a day (QID) | INTRAMUSCULAR | Status: DC | PRN
  Administered 2019-12-24: 5 mg via INTRAMUSCULAR
  Filled 2019-12-23: qty 1

## 2019-12-23 MED ORDER — LORAZEPAM 1 MG PO TABS: 1.0000 mg | ORAL_TABLET | ORAL | Status: DC | PRN

## 2019-12-23 MED ORDER — TEMAZEPAM 30 MG PO CAPS
30.0000 mg | ORAL_CAPSULE | Freq: Every day | ORAL | Status: DC
  Filled 2019-12-23 (×2): qty 1

## 2019-12-23 MED ORDER — DIPHENHYDRAMINE HCL 50 MG/ML IJ SOLN
INTRAMUSCULAR | Status: AC
  Administered 2019-12-23: 50 mg via INTRAMUSCULAR
  Filled 2019-12-23: qty 1

## 2019-12-23 MED ORDER — BENZTROPINE MESYLATE 2 MG PO TABS
2.0000 mg | ORAL_TABLET | Freq: Two times a day (BID) | ORAL | Status: DC
  Administered 2019-12-26 – 2019-12-31 (×12): 2 mg via ORAL
  Filled 2019-12-23 (×20): qty 1

## 2019-12-23 MED ORDER — CARVEDILOL 25 MG PO TABS
25.0000 mg | ORAL_TABLET | Freq: Two times a day (BID) | ORAL | Status: DC
  Administered 2019-12-26 – 2019-12-31 (×12): 25 mg via ORAL
  Filled 2019-12-23 (×20): qty 1

## 2019-12-23 MED ORDER — ALUM & MAG HYDROXIDE-SIMETH 200-200-20 MG/5ML PO SUSP: 30.0000 mL | ORAL | Status: DC | PRN

## 2019-12-23 MED ORDER — HALOPERIDOL LACTATE 5 MG/ML IJ SOLN
INTRAMUSCULAR | Status: AC
  Administered 2019-12-23: 5 mg via INTRAMUSCULAR
  Filled 2019-12-23: qty 1

## 2019-12-23 MED ORDER — HALOPERIDOL 5 MG PO TABS
20.0000 mg | ORAL_TABLET | Freq: Every day | ORAL | Status: DC
  Filled 2019-12-23 (×3): qty 4

## 2019-12-23 MED ORDER — AMLODIPINE BESYLATE 5 MG PO TABS
5.0000 mg | ORAL_TABLET | Freq: Every day | ORAL | Status: DC
  Administered 2019-12-26 – 2019-12-31 (×6): 5 mg via ORAL
  Filled 2019-12-23 (×11): qty 1

## 2019-12-23 NOTE — ED Notes (Signed)
Per request, pt given toiletries at this time. Pt states she can clean herself and doesn't need assistance.

## 2019-12-23 NOTE — Progress Notes (Signed)
Pt arrived at [facility] and she was extremely agitated and psychotic.  Order received from Suella Broad, FNP for Haldol 5mg  IM, Ativan 2mg  IM, and Benadryl 50mg  IM.  RN administered Haldol, Ativan, and Benadryl without incident.    Because of pt's mental capacity, this RN was unable to complete and enter pt's admission assessment  Into the computer.

## 2019-12-23 NOTE — ED Notes (Signed)
Report given to [facility] at [facility] and [employer] called for transport.

## 2019-12-23 NOTE — ED Notes (Signed)
Pt kept asking for her bra. This Probation officer inspected her bra for safety and returned it to her. Then she would not put it back on.   ON BELONGINGS BAG IS IN LOCKER 26 CONTAINING PT'S JEANS. SHE DENIED WEARING SHOES WHEN SHE CAME IN.

## 2019-12-23 NOTE — Progress Notes (Signed)
   12/23/19 2100  Psych Admission Type (Psych Patients Only)  Admission Status Involuntary  Psychosocial Assessment  Patient Complaints None  Eye Contact Fair  Affect Irritable  Speech Loud;Other (Comment);Rapid  Appearance/Hygiene Disheveled  Mood Irritable;Anxious  Aggressive Behavior  Effect No apparent injury  Thought Process  Coherency Unable to assess  Content UTA  Delusions UTA  Perception UTA  Hallucination UTA  Judgment UTA  Confusion UTA   Pt been sleep due to getting IMs earlier

## 2019-12-23 NOTE — ED Notes (Signed)
Pt ambulated independently with steady gait to the restroom.

## 2019-12-23 NOTE — ED Notes (Signed)
Tried to call report to [facility] and they said that they would try to call back in 1/2 hour.

## 2019-12-23 NOTE — ED Notes (Signed)
Transported to [facility] by [facility]. One belongings bag given to sheriff. Pt went without incident.

## 2019-12-23 NOTE — BH Assessment (Addendum)
[place] Assessment Progress Note  Per Shuvon Rankin, FNP, this pt requires psychiatric hospitalization.  Jasmine has assigned pt to [id number]; [facility] will be ready to receive pt at 16:00.  Pt presents under IVC initiated by pt's sister, and upheld by EDP Ezequiel Essex, MD, and IVC documents have been faxed to [facility].  Pt's nurse has been notified, and agrees to call report to [phone].  Pt is to be transported via [employer].   Jalene Mullet, [place] Coordinator [phone]

## 2019-12-23 NOTE — ED Notes (Signed)
Pt refuses to keep mask on or stay in assigned room. Pt unable to be verbally redirected at this time. Pt moved to TCU 27 with all belongings.

## 2019-12-23 NOTE — Discharge Summary (Signed)
  [name], 54 y.o., female patient seen via tele psych by this provider, Dr. Dwyane Dee; and chart reviewed on 12/23/19.  On evaluation Paula Dickson is up moving around in room; then stated she had to go to the bathroom.  Patient is not a good historian she is unaware of why she was in the hospital stating " I had no choice."  Patient also states she hears the [place] speaking to her; stating the Reita Cliche is " telling me a lot of stuff, talking about all the stuff I do not need."  Patient also states she is seeing snakes.  "  I live by myself.  I'm a scary cat sometimes, but I do not want a sleepless nights.  I see snakes all in the house."  Patient is also disorganized.  Patient denies suicidal/homicidal ideations.      Disposition: Recommend psychiatric Inpatient admission when medically cleared.   Patient assented to [place] for inpatient psychiatric treatment.  Patient to be transferred to [facility] this afternoon after bed available.

## 2019-12-24 LAB — LAMOTRIGINE LEVEL: Lamotrigine Lvl: 2.6 ug/mL (ref 2.0–20.0)

## 2019-12-24 MED ORDER — HALOPERIDOL LACTATE 5 MG/ML IJ SOLN
10.0000 mg | Freq: Two times a day (BID) | INTRAMUSCULAR | Status: DC
  Administered 2019-12-24 – 2019-12-25 (×4): 10 mg via INTRAMUSCULAR
  Filled 2019-12-24 (×21): qty 2

## 2019-12-24 MED ORDER — CLONAZEPAM 1 MG PO TABS: 2.0000 mg | ORAL_TABLET | Freq: Once | ORAL | Status: DC

## 2019-12-24 MED ORDER — LORAZEPAM 2 MG/ML IJ SOLN
2.0000 mg | Freq: Once | INTRAMUSCULAR | Status: AC
  Administered 2019-12-24: 10:00:00 2 mg via INTRAMUSCULAR
  Filled 2019-12-24: qty 1

## 2019-12-24 MED ORDER — HALOPERIDOL 5 MG PO TABS
10.0000 mg | ORAL_TABLET | Freq: Two times a day (BID) | ORAL | Status: DC
  Administered 2019-12-26 – 2019-12-31 (×12): 10 mg via ORAL
  Filled 2019-12-24 (×18): qty 2

## 2019-12-24 NOTE — Progress Notes (Signed)
Pt given IM PRN Haldol per [facility]

## 2019-12-24 NOTE — Progress Notes (Signed)
   12/24/19 2100  Psych Admission Type (Psych Patients Only)  Admission Status Involuntary  Psychosocial Assessment  Patient Complaints Irritability  Eye Contact Fair  Facial Expression Anxious  Affect Irritable  Speech Loud;Other (Comment);Rapid  Interaction Demanding  Motor Activity Slow  Appearance/Hygiene Disheveled  Behavior Characteristics Unwilling to participate  Aggressive Behavior  Effect No apparent injury  Thought Process  Coherency Unable to assess;Circumstantial  Content Blaming others  Delusions UTA;WDL  Perception Hallucinations  Hallucination Auditory  Judgment Impaired  Confusion WDL  Danger to Self  Current suicidal ideation? Denies  Danger to Others  Danger to Others None reported or observed

## 2019-12-24 NOTE — Progress Notes (Signed)
Pt has stated she was healed and she does not need any medication.

## 2019-12-24 NOTE — Tx Team (Signed)
Interdisciplinary Treatment and Diagnostic Plan Update  12/24/2019 Time of Session: 9:25am  Paula Dickson MRN: [id number]  Principal Diagnosis: <principal problem not specified>  Secondary Diagnoses: Active Problems:   Severe manic bipolar 1 disorder with psychotic behavior (Paula Dickson)   Current Medications:  Current Facility-Administered Medications  Medication Dose Route Frequency Provider Last Rate Last Admin  . acetaminophen (TYLENOL) tablet 650 mg  650 mg Oral Q6H PRN Rankin, Shuvon B, NP      . alum & mag hydroxide-simeth (MAALOX/MYLANTA) 200-200-20 MG/5ML suspension 30 mL  30 mL Oral Q4H PRN Rankin, Shuvon B, NP      . amLODipine (NORVASC) tablet 5 mg  5 mg Oral Daily Rankin, Shuvon B, NP      . benztropine (COGENTIN) tablet 2 mg  2 mg Oral BID Rankin, Shuvon B, NP      . carvedilol (COREG) tablet 25 mg  25 mg Oral BID WC Rankin, Shuvon B, NP      . haloperidol lactate (HALDOL) injection 10 mg  10 mg Intramuscular BID Johnn Hai, MD   10 mg at 12/24/19 0946  . haloperidol lactate (HALDOL) injection 5 mg  5 mg Intramuscular Q6H PRN Suella Broad, FNP   5 mg at 12/24/19 0420  . LORazepam (ATIVAN) injection 2 mg  2 mg Intramuscular Q6H Suella Broad, FNP   2 mg at 12/24/19 0227  . magnesium hydroxide (MILK OF MAGNESIA) suspension 30 mL  30 mL Oral Daily PRN Rankin, Shuvon B, NP      . temazepam (RESTORIL) capsule 30 mg  30 mg Oral QHS Rankin, Shuvon B, NP      . ziprasidone (GEODON) injection 20 mg  20 mg Intramuscular Q12H PRN Suella Broad, FNP       PTA Medications: Medications Prior to Admission  Medication Sig Dispense Refill Last Dose  . amLODipine (NORVASC) 5 MG tablet Take 1 tablet (5 mg total) by mouth daily. 30 tablet 0   . benztropine (COGENTIN) 2 MG tablet Take 1 tablet (2 mg total) by mouth 2 (two) times daily. 60 tablet 0   . carvedilol (COREG) 25 MG tablet Take 1 tablet (25 mg total) by mouth 2 (two) times daily with a meal. 60 tablet 2    . haloperidol (HALDOL) 10 MG tablet Take 2 tablets (20 mg total) by mouth at bedtime. 60 tablet 0   . lamoTRIgine (LAMICTAL) 25 MG tablet Take 2 tablets (50 mg total) by mouth 2 (two) times daily. 120 tablet 0   . temazepam (RESTORIL) 30 MG capsule Take 1 capsule (30 mg total) by mouth at bedtime. 60 capsule 0     Patient Stressors:    Patient Strengths:    Treatment Modalities: Medication Management, Group therapy, Case management,  1 to 1 session with clinician, Psychoeducation, Recreational therapy.   Physician Treatment Plan for Primary Diagnosis: <principal problem not specified> Long Term Goal(s):     Short Term Goals:    Medication Management: Evaluate patient's response, side effects, and tolerance of medication regimen.  Therapeutic Interventions: 1 to 1 sessions, Unit Group sessions and Medication administration.  Evaluation of Outcomes: Not Progressing  Physician Treatment Plan for Secondary Diagnosis: Active Problems:   Severe manic bipolar 1 disorder with psychotic behavior (Paula Dickson)  Long Term Goal(s):     Short Term Goals:       Medication Management: Evaluate patient's response, side effects, and tolerance of medication regimen.  Therapeutic Interventions: 1 to 1 sessions, Unit Group sessions and  Medication administration.  Evaluation of Outcomes: Not Progressing   RN Treatment Plan for Primary Diagnosis: <principal problem not specified> Long Term Goal(s): Knowledge of disease and therapeutic regimen to maintain health will improve  Short Term Goals: Ability to participate in decision making will improve, Ability to verbalize feelings will improve, Ability to disclose and discuss suicidal ideas, Ability to identify and develop effective coping behaviors will improve and Compliance with prescribed medications will improve  Medication Management: RN will administer medications as ordered by provider, will assess and evaluate patient's response and provide  education to patient for prescribed medication. RN will report any adverse and/or side effects to prescribing provider.  Therapeutic Interventions: 1 on 1 counseling sessions, Psychoeducation, Medication administration, Evaluate responses to treatment, Monitor vital signs and CBGs as ordered, Perform/monitor CIWA, COWS, AIMS and Fall Risk screenings as ordered, Perform wound care treatments as ordered.  Evaluation of Outcomes: Not Progressing   LCSW Treatment Plan for Primary Diagnosis: <principal problem not specified> Long Term Goal(s): Safe transition to appropriate next level of care at discharge, Engage patient in therapeutic group addressing interpersonal concerns.  Short Term Goals: Engage patient in aftercare planning with referrals and resources and Increase skills for wellness and recovery  Therapeutic Interventions: Assess for all discharge needs, 1 to 1 time with Social worker, Explore available resources and support systems, Assess for adequacy in community support network, Educate family and significant other(s) on suicide prevention, Complete Psychosocial Assessment, Interpersonal group therapy.  Evaluation of Outcomes: Not Progressing   Progress in Treatment: Attending groups: No. new to unit  Participating in groups: No. Taking medication as prescribed: Yes. Toleration medication: Yes. Family/Significant other contact made: No, will contact:  if given consent  Patient understands diagnosis: No. Discussing patient identified problems/goals with staff: Yes. Medical problems stabilized or resolved: Yes. Denies suicidal/homicidal ideation: Yes. Issues/concerns per patient self-inventory: No. Other:   New problem(s) identified: No, Describe:  none   New Short Term/Long Term Goal(s): Medication stabilization, elimination of SI thoughts, and development of a comprehensive mental wellness plan.   Patient Goals:  "Sign up for registration"   Discharge Plan or Barriers: CSW  will continue to follow up for appropriate referrals and possible discharge planning  Reason for Continuation of Hospitalization: Aggression Delusions  Hallucinations  Estimated Length of Stay: 3-5 days   Attendees: Patient: Paula Dickson 12/24/2019   Physician: 12/24/2019   Nursing: Vladimir Faster, RN  12/24/2019   RN Care Manager: 12/24/2019   Social Worker: Ovidio Kin, MSW intern  12/24/2019   Recreational Therapist:  12/24/2019   Other: Marcie Bal, NP 12/24/2019   Other:  12/24/2019   Other: 12/24/2019     Scribe for Treatment Team: Billey Chang, [place] Work 12/24/2019 9:54 AM

## 2019-12-24 NOTE — Progress Notes (Signed)
Recreation Therapy Notes  Date: 1.27.21 Time: 1000 Location: 500 Hall Dayroom  Group Topic: Self-Esteem  Goal Area(s) Addresses:  Patient will successfully identify positive attributes about themselves.  Patient will successfully identify benefit of improved self-esteem.   Behavioral Response: None  Intervention: Worksheet, colored pencils, music  Activity: Crest of Arms.  Patients were to identify four things that are important to them and place them in the crest in a creative way.  Patients could identify important dates, accomplishments, people, etc.  Education:  Self-Esteem, Dentist.   Education Outcome: Acknowledges education/In group clarification offered/Needs additional education  Clinical Observations/Feedback: Pt came into group, got the instructions and took her sheet/crayons to her room to complete activity.    [name], LRT/CTRS     [name] A 12/24/2019 11:28 AM

## 2019-12-24 NOTE — Progress Notes (Signed)
Pt coming out the room hyper verbal needing redirection being loud

## 2019-12-24 NOTE — Progress Notes (Signed)
Pt constantly coming out of her room being loud , hard to redirect waking up patients

## 2019-12-24 NOTE — Progress Notes (Signed)
Pt woke up in the middle oh the night making noise not being able to be redirected. Pt waking up other patients.

## 2019-12-24 NOTE — Progress Notes (Signed)
Pt out the room numerous times asking for irrelevant things, pt asking to use the phone even after she was told it does not come on until 6 am .

## 2019-12-24 NOTE — H&P (Addendum)
Psychiatric Admission Assessment Adult  Patient Identification: Paula Dickson MRN:  [id number] Date of Evaluation:  12/24/2019 Chief Complaint:  Severe manic bipolar 1 disorder with psychotic behavior ([place]) [F31.2] Principal Diagnosis: Schizoaffective type disorder/acute exacerbation Diagnosis:  Active Problems:   Severe manic bipolar 1 disorder with psychotic behavior ([place])  History of Present Illness:   This is the latest of multiple admissions here and elsewhere in the latest of multiple healthcare system encounters for Paula Dickson she is 46 suffers from a chronic schizoaffective/bipolar type condition complicated by compliance issues, and her condition can range from near catatonia to volatility. She had a hospitalization in December related to lithium toxicity and since that time is been stabilized with Haldol and lamotrigine.   She came to our attention through the emergency department on 1/25 she was placed under petition for involuntary commitment.  She was described as noncompliant, not taking care of herself and hygiene, making multiple phone calls to family members, expressing delusional believes and even threatening to burn the house down with herself inside according to the initial complaint. She further presented with intense paranoia  At the present time she is oriented to person place situation she is more compliant but expressing paranoia towards family but will not elaborate she reports auditory hallucinations but will not elaborate and she is evasive in the interview.  Clearly need of inpatient stabilization.  According the assessment team note of 1/25:  Paula Dickson is a 54 y.o. female who was brought to [facility] via the [employer] under an IVC that was completed by her sister. According to nursing notes, pt was screaming, loud, and in handcuffs when she arrived with the [facility], though it was noted that she was *not* physically aggressive. The IVC states:    "[name] states: The respondent has been diagnosed with bipolar/schizophrenia disorder. The respondent has not been eating, sleeping, or tending to her personal hygiene. The respondent refuses to take her medication, she doesn't believe she needs treatment. The respondent has been calling her brother stating she is going to burn down her house and sit in the home to watch it burn. The respondent is hallucinating, she believes her deceased parents are still living and claims she cooks dinner for them every day. The respondent has grown aggressive; she [curses] individuals out, [pushes] them, [calls] them names, and [threatens] to harm them (this includes petitioner). The respondent calls petitioner, among others, 10 to 15 times a day. The respondent has fallen several times but [refuses] to let the physical therapist in the home for her sessions. The respondent has been known to laugh, scream, and cry all within a very short period of time."  Pt states she has been experiencing "too much." Clinician prompted for pt to elaborate on this, and pt shared that she had asked a lady to make some pictures but the lady is now deceased. She also noted she spends time with another lady who is developmentally delayed, though she enjoys the time they spend together. Clinician requested pt share what occurred to result in her being brought to the hospital tonight. Pt stated that clinician didn't listen and that she missed the point. Clinician suggested they continue with the assessment, as the questions were primarily "yes/no" answers, so pt wouldn't have to provide information other than that.  Pt denied she is currently experiencing SI, though she acknowledges she has experienced SI in the past and that she has attempted to kill herself in the past; she stated she can't  remember when this incident took place. Pt states she has been hospitalized in the past, though she states she cannot remember when or where these  hospitalizations occurred (though pt later stated she went to [facility] in the past, and she has past hx of being admitted to [facility]). Pt denies she has a plan to kill herself. Pt denies a current or prior hx of HI.  At this point in the assessment, pt became frustrated with the questions and asked to see what clinician was writing. Clinician held her paper up to the camera in an effort to allow pt to read what she had written, and pt tried to read it but was unable; pt then reached for the paper and told clinician to hand the paper to her. Clinician explained she was in a different room and that she was unable to hand the paper to her. Pt again told clinician to give her the paper so she could read it and clinician explained that she was on the tv and could not hand her things. Pt then expressed an understanding and stated she forgot and thought clinician was sitting down.  Pt again became upset and was yelling at clinician, making statements about the hospital keeping her here forever, [place] killing her, and [place] putting her in the incinerator. Despite clinician's attempts to talk to pt, she was unwilling/unable to be rationalized with and continued to argue and yell. Clinician ended the assessment, as pt was unwilling to complete any more of it and attempting to do so would have escalated her further.  Pt is oriented x2; she was unsure of the date (she thought it was mid-February or possibly around the 30th of January, 2021) and she could/would not provide information regarding why she was at the hospital; MSE was essentially UTA. Pt's recent and remote memory was UTA. Pt was irritable and argumentative throughout the assessment process. Pt's insight, judgement, and impulse control is fair - poor at this time.  Associated Signs/Symptoms: Depression Symptoms:  insomnia, psychomotor agitation, (Hypo) Manic Symptoms:  Delusions, Distractibility, Anxiety Symptoms:  n/a Psychotic Symptoms:   Paranoia, PTSD Symptoms: NA Total Time spent with patient: 45 minutes  Past Psychiatric History: Extensive  Is the patient at risk to self? Yes.    Has the patient been a risk to self in the past 6 months? No.  Has the patient been a risk to self within the distant past? Yes.    Is the patient a risk to others? No.  Has the patient been a risk to others in the past 6 months? Yes.    Has the patient been a risk to others within the distant past? Yes.     Prior Inpatient Therapy:  Multiple stays here Prior Outpatient Therapy:  Multiple visits  Alcohol Screening:   Substance Abuse History in the last 12 months:  No. Consequences of Substance Abuse: NA Previous Psychotropic Medications: Yes  Psychological Evaluations: No  Past Medical History:  Past Medical History:  Diagnosis Date  . Bipolar affective disorder ([place])   . History of arthritis   . History of chicken pox   . History of depression   . History of genital warts   . history of heart murmur   . History of high blood pressure   . History of thyroid disease   . History of UTI   . Hypertension   . Low TSH level 07/13/2017  . Schizophrenia [name])     Past Surgical History:  Procedure  Laterality Date  . ABLATION ON ENDOMETRIOSIS    . CYST REMOVAL NECK     around 11 years ago /benign  . MULTIPLE TOOTH EXTRACTIONS     Family History:  Family History  Problem Relation Age of Onset  . Arthritis Father   . Hyperlipidemia Father   . High blood pressure Father   . Diabetes Sister   . Diabetes Mother   . Diabetes Brother   . Mental illness Brother   . Alcohol abuse Paternal Uncle   . Alcohol abuse Paternal Grandfather   . Breast cancer Maternal Aunt   . Breast cancer Paternal Aunt   . High blood pressure Sister   . Mental illness Other        runs in family   Family Psychiatric  History: Patient denies but old record indicates possibility Tobacco Screening:   Social History:  Social History   Substance and  Sexual Activity  Alcohol Use No     Social History   Substance and Sexual Activity  Drug Use No    Additional Social History:                           Allergies:  No Known Allergies Lab Results:  Results for orders placed or performed during the hospital encounter of 12/22/19 (from the past 48 hour(s))  Comprehensive metabolic panel     Status: Abnormal   Collection Time: 12/22/19  1:22 PM  Result Value Ref Range   Sodium 145 135 - 145 mmol/L   Potassium 3.0 (L) 3.5 - 5.1 mmol/L   Chloride 110 98 - 111 mmol/L   CO2 27 22 - 32 mmol/L   Glucose, Bld 110 (H) 70 - 99 mg/dL   BUN 11 6 - 20 mg/dL   Creatinine, Ser 0.90 0.44 - 1.00 mg/dL   Calcium 9.8 8.9 - 10.3 mg/dL   Total Protein 6.4 (L) 6.5 - 8.1 g/dL   Albumin 3.9 3.5 - 5.0 g/dL   AST 14 (L) 15 - 41 U/L   ALT 16 0 - 44 U/L   Alkaline Phosphatase 62 38 - 126 U/L   Total Bilirubin 0.3 0.3 - 1.2 mg/dL   GFR calc non Af Amer >60 >60 mL/min   GFR calc Af Amer >60 >60 mL/min   Anion gap 8 5 - 15    Comment: Performed at [facility], [place] [address], [place], [place] [address]  Ethanol     Status: None   Collection Time: 12/22/19  1:22 PM  Result Value Ref Range   Alcohol, Ethyl (B) <10 <10 mg/dL    Comment: (NOTE) Lowest detectable limit for serum alcohol is 10 mg/dL. For medical purposes only. Performed at [facility], [place] [address] [place], [place] [address]   CBC with Diff     Status: None   Collection Time: 12/22/19  1:22 PM  Result Value Ref Range   WBC 5.2 4.0 - 10.5 K/uL   RBC 4.29 3.87 - 5.11 MIL/uL   Hemoglobin 12.1 12.0 - 15.0 g/dL   HCT 38.0 36.0 - 46.0 %   MCV 88.6 80.0 - 100.0 fL   MCH 28.2 26.0 - 34.0 pg   MCHC 31.8 30.0 - 36.0 g/dL   RDW 13.2 11.5 - 15.5 %   Platelets 208 150 - 400 K/uL   nRBC 0.0 0.0 - 0.2 %   Neutrophils Relative % 48 %   Neutro Abs  2.5 1.7 - 7.7 K/uL   Lymphocytes Relative 40 %   Lymphs Abs 2.1 0.7 - 4.0 K/uL   Monocytes  Relative 10 %   Monocytes Absolute 0.5 0.1 - 1.0 K/uL   Eosinophils Relative 1 %   Eosinophils Absolute 0.1 0.0 - 0.5 K/uL   Basophils Relative 1 %   Basophils Absolute 0.0 0.0 - 0.1 K/uL   Immature Granulocytes 0 %   Abs Immature Granulocytes 0.01 0.00 - 0.07 K/uL    Comment: Performed at [facility], [place] [address], [place], [place] [address]  Troponin I (High Sensitivity)     Status: None   Collection Time: 12/22/19  6:51 PM  Result Value Ref Range   Troponin I (High Sensitivity) 3 <18 ng/L    Comment: (NOTE) Elevated high sensitivity troponin I (hsTnI) values and significant  changes across serial measurements may suggest ACS but many other  chronic and acute conditions are known to elevate hsTnI results.  Refer to the "Links" section for chest pain algorithms and additional  guidance. Performed at [facility], [place] [address] [place], [place] [address]   Respiratory Panel by RT PCR (Flu A&B, Covid) - Nasopharyngeal Swab     Status: None   Collection Time: 12/22/19  8:33 PM   Specimen: Nasopharyngeal Swab  Result Value Ref Range   SARS Coronavirus 2 by RT PCR NEGATIVE NEGATIVE    Comment: (NOTE) SARS-CoV-2 target nucleic acids are NOT DETECTED. The SARS-CoV-2 RNA is generally detectable in upper respiratoy specimens during the acute phase of infection. The lowest concentration of SARS-CoV-2 viral copies this assay can detect is 131 copies/mL. A negative result does not preclude SARS-Cov-2 infection and should not be used as the sole basis for treatment or other patient management decisions. A negative result may occur with  improper specimen collection/handling, submission of specimen other than nasopharyngeal swab, presence of viral mutation(s) within the areas targeted by this assay, and inadequate number of viral copies (<131 copies/mL). A negative result must be combined with clinical observations, patient history, and  epidemiological information. The expected result is Negative. Fact Sheet for Patients:  PinkCheek.be Fact Sheet for Healthcare Providers:  GravelBags.it This test is not yet ap proved or cleared by the [place] FDA and  has been authorized for detection and/or diagnosis of SARS-CoV-2 by FDA under an Emergency Use Authorization (EUA). This EUA will remain  in effect (meaning this test can be used) for the duration of the COVID-19 declaration under Section 564(b)(1) of the Act, 21 U.S.C. section 360bbb-3(b)(1), unless the authorization is terminated or revoked sooner.    Influenza A by PCR NEGATIVE NEGATIVE   Influenza B by PCR NEGATIVE NEGATIVE    Comment: (NOTE) The Xpert Xpress SARS-CoV-2/FLU/RSV assay is intended as an aid in  the diagnosis of influenza from Nasopharyngeal swab specimens and  should not be used as a sole basis for treatment. Nasal washings and  aspirates are unacceptable for Xpert Xpress SARS-CoV-2/FLU/RSV  testing. Fact Sheet for Patients: PinkCheek.be Fact Sheet for Healthcare Providers: GravelBags.it This test is not yet approved or cleared by the [place] FDA and  has been authorized for detection and/or diagnosis of SARS-CoV-2 by  FDA under an Emergency Use Authorization (EUA). This EUA will remain  in effect (meaning this test can be used) for the duration of the  Covid-19 declaration under Section 564(b)(1) of the Act, 21  U.S.C. section 360bbb-3(b)(1), unless the authorization is  terminated or revoked. Performed  at [facility], [place] [address] [place], [place] [address]   Troponin I (High Sensitivity)     Status: None   Collection Time: 12/23/19  1:14 AM  Result Value Ref Range   Troponin I (High Sensitivity) 5 <18 ng/L    Comment: (NOTE) Elevated high sensitivity troponin I (hsTnI) values and significant   changes across serial measurements may suggest ACS but many other  chronic and acute conditions are known to elevate hsTnI results.  Refer to the "Links" section for chest pain algorithms and additional  guidance. Performed at [facility], [place] [address], [place], [place] [address]   Urine rapid drug screen (hosp performed)     Status: Abnormal   Collection Time: 12/23/19  6:58 AM  Result Value Ref Range   Opiates NONE DETECTED NONE DETECTED   Cocaine NONE DETECTED NONE DETECTED   Benzodiazepines POSITIVE (A) NONE DETECTED   Amphetamines NONE DETECTED NONE DETECTED   Tetrahydrocannabinol NONE DETECTED NONE DETECTED   Barbiturates NONE DETECTED NONE DETECTED    Comment: (NOTE) DRUG SCREEN FOR MEDICAL PURPOSES ONLY.  IF CONFIRMATION IS NEEDED FOR ANY PURPOSE, NOTIFY LAB WITHIN 5 DAYS. LOWEST DETECTABLE LIMITS FOR URINE DRUG SCREEN Drug Class                     Cutoff (ng/mL) Amphetamine and metabolites    1000 Barbiturate and metabolites    200 Benzodiazepine                 [id number] Tricyclics and metabolites     300 Opiates and metabolites        300 Cocaine and metabolites        300 THC                            50 Performed at [facility], [place] [address] [place], [place] [address]   Urinalysis, Routine w reflex microscopic     Status: None   Collection Time: 12/23/19  6:58 AM  Result Value Ref Range   Color, Urine YELLOW YELLOW   APPearance CLEAR CLEAR   Specific Gravity, Urine 1.008 1.005 - 1.030   pH 6.0 5.0 - 8.0   Glucose, UA NEGATIVE NEGATIVE mg/dL   Hgb urine dipstick NEGATIVE NEGATIVE   Bilirubin Urine NEGATIVE NEGATIVE   Ketones, ur NEGATIVE NEGATIVE mg/dL   Protein, ur NEGATIVE NEGATIVE mg/dL   Nitrite NEGATIVE NEGATIVE   Leukocytes,Ua NEGATIVE NEGATIVE    Comment: Performed at [place] [address], [place], [place] [address]  TSH     Status: Abnormal   Collection Time: 12/23/19 11:40 AM   Result Value Ref Range   TSH 0.020 (L) 0.350 - 4.500 uIU/mL    Comment: Performed by a 3rd Generation assay with a functional sensitivity of <=0.01 uIU/mL. Performed at [facility], [place] [address], [place],  [address]   Lipid panel     Status: Abnormal   Collection Time: 12/23/19 11:40 AM  Result Value Ref Range   Cholesterol 120 0 - 200 mg/dL   Triglycerides 102 <150 mg/dL   HDL 40 (L) >40 mg/dL   Total CHOL/HDL Ratio 3.0 RATIO   VLDL 20 0 - 40 mg/dL   LDL Cholesterol 60 0 - 99 mg/dL    Comment:        Total Cholesterol/HDL:CHD Risk Coronary Heart Disease Risk Table  Men   Women  1/2 Average Risk   3.4   3.3  Average Risk       5.0   4.4  2 X Average Risk   9.6   7.1  3 X Average Risk  23.4   11.0        Use the calculated Patient Ratio above and the CHD Risk Table to determine the patient's CHD Risk.        ATP III CLASSIFICATION (LDL):  <100     mg/dL   Optimal  100-129  mg/dL   Near or Above                    Optimal  130-159  mg/dL   Borderline  160-189  mg/dL   High  >190     mg/dL   Very High Performed at [place] [address], [place], [place] [address]     Blood Alcohol level:  Lab Results  Component Value Date   [facility] 2 <10 12/22/2019   ETH <10 123XX123    Metabolic Disorder Labs:  Lab Results  Component Value Date   HGBA1C 5.6 03/14/2019   MPG 114.02 03/14/2019   MPG 108.28 11/03/2018   Lab Results  Component Value Date   PROLACTIN 11.0 03/14/2019   Lab Results  Component Value Date   CHOL 120 12/23/2019   TRIG 102 12/23/2019   HDL 40 (L) 12/23/2019   CHOLHDL 3.0 12/23/2019   VLDL 20 12/23/2019   LDLCALC 60 12/23/2019   LDLCALC 78 03/14/2019    Current Medications: Current Facility-Administered Medications  Medication Dose Route Frequency Provider Last Rate Last Admin  . acetaminophen (TYLENOL) tablet 650 mg  650 mg Oral Q6H PRN Rankin, Shuvon B, NP      . alum & mag  hydroxide-simeth (MAALOX/MYLANTA) 200-200-20 MG/5ML suspension 30 mL  30 mL Oral Q4H PRN Rankin, Shuvon B, NP      . amLODipine (NORVASC) tablet 5 mg  5 mg Oral Daily Rankin, Shuvon B, NP      . benztropine (COGENTIN) tablet 2 mg  2 mg Oral BID Rankin, Shuvon B, NP      . carvedilol (COREG) tablet 25 mg  25 mg Oral BID WC Rankin, Shuvon B, NP      . haloperidol (HALDOL) tablet 10 mg  10 mg Oral BID Johnn Hai, MD      . haloperidol lactate (HALDOL) injection 10 mg  10 mg Intramuscular BID Johnn Hai, MD   10 mg at 12/24/19 0946  . haloperidol lactate (HALDOL) injection 5 mg  5 mg Intramuscular Q6H PRN Suella Broad, FNP   5 mg at 12/24/19 0420  . LORazepam (ATIVAN) injection 2 mg  2 mg Intramuscular Q6H Suella Broad, FNP   2 mg at 12/24/19 0227  . magnesium hydroxide (MILK OF MAGNESIA) suspension 30 mL  30 mL Oral Daily PRN Rankin, Shuvon B, NP      . temazepam (RESTORIL) capsule 30 mg  30 mg Oral QHS Rankin, Shuvon B, NP      . ziprasidone (GEODON) injection 20 mg  20 mg Intramuscular Q12H PRN Suella Broad, FNP       PTA Medications: Medications Prior to Admission  Medication Sig Dispense Refill Last Dose  . amLODipine (NORVASC) 5 MG tablet Take 1 tablet (5 mg total) by mouth daily. 30 tablet 0   . benztropine (COGENTIN) 2 MG tablet Take 1 tablet (2 mg total) by  mouth 2 (two) times daily. 60 tablet 0   . carvedilol (COREG) 25 MG tablet Take 1 tablet (25 mg total) by mouth 2 (two) times daily with a meal. 60 tablet 2   . haloperidol (HALDOL) 10 MG tablet Take 2 tablets (20 mg total) by mouth at bedtime. 60 tablet 0   . lamoTRIgine (LAMICTAL) 25 MG tablet Take 2 tablets (50 mg total) by mouth 2 (two) times daily. 120 tablet 0   . temazepam (RESTORIL) 30 MG capsule Take 1 capsule (30 mg total) by mouth at bedtime. 60 capsule 0     Musculoskeletal: Strength & Muscle Tone: within normal limits Gait & Station: normal Patient leans: N/A  Psychiatric Specialty  Exam: Physical Exam  Vitals reviewed. Constitutional: She appears well-developed and well-nourished.  Cardiovascular: Normal rate and regular rhythm.    Review of Systems  Constitutional: Negative.   Eyes: Negative.   Respiratory: Negative.   Endocrine: Negative.   Genitourinary: Negative.   Allergic/Immunologic: Negative.   Neurological: Positive for tremors.    There were no vitals taken for this visit.There is no height or weight on file to calculate BMI.  General Appearance: Casual  Eye Contact:  Minimal  Speech:  Clear and Coherent  Volume:  Decreased  Mood:  Anxious  Affect:  Congruent  Thought Process:  Irrelevant and Descriptions of Associations: Loose  Orientation:  Full (Time, Place, and Person) with prompts  Thought Content:  Illogical and Delusions  Suicidal Thoughts:  No  Homicidal Thoughts:  No  Memory:  Immediate;   Would not cooperate with 3 of 3 testing Recent;   Fair Remote;   Fair  Judgement:  Impaired  Insight:  Shallow  Psychomotor Activity:  EPS  Concentration:  Concentration: Poor and Attention Span: Poor  Recall:  [employer] of Knowledge:  Fair  Language:  Fair  Akathisia:  Negative  Handed:  Right  AIMS (if indicated):     Assets:  Physical Health  ADL's:  Intact  Cognition:  WNL  Sleep:  Number of Hours: 5.25    Treatment Plan Summary: Daily contact with patient to assess and evaluate symptoms and progress in treatment and Medication management  Observation Level/Precautions:  15 minute checks  Laboratory:  UDS  Psychotherapy: Cognitive and reality based med and illness education  Medications: Resume Haldol  Consultations: None necessary  Discharge Concerns: Long-term compliance  Estimated LOS: 7 days  Other:     Physician Treatment Plan for Primary Diagnosis: Schizoaffective bipolar type-begin Haldol and anticipate long-acting injectable Long Term Goal(s): Improvement in symptoms so as ready for discharge  Short Term Goals:  Ability to disclose and discuss suicidal ideas and Ability to demonstrate self-control will improve  Physician Treatment Plan for Secondary Diagnosis: Active Problems:   Severe manic bipolar 1 disorder with psychotic behavior ([place])  Long Term Goal(s): Improvement in symptoms so as ready for discharge  Short Term Goals: Compliance with prescribed medications will improve and Ability to identify triggers associated with substance abuse/mental health issues will improve  I certify that inpatient services furnished can reasonably be expected to improve the patient's condition.    Johnn Hai, MD 1/27/202111:47 AM

## 2019-12-24 NOTE — Progress Notes (Signed)
Pt was given her 0000 dose of IM Ativan because pt was up disturbing the milieu and pt was sleep at 0000 due to IMs given earlier. Pt accepted the medication per MAR without incidence.

## 2019-12-24 NOTE — Progress Notes (Signed)
Patient denies SI, HI and AVH this shift.  Patient had no incidents of behavioral dyscontrol this shift.  Patient has been compliant with medications and engaged in groups and unit activities.   Assess patient for safety, offer medications as prescribed, engage patient in 1:1 staff talks.   Continue to monitor as planned. Patient able  to contract for safety

## 2019-12-24 NOTE — Progress Notes (Signed)
Pt in her room talking loudly even after she was encouraged to keep it down for her peers

## 2019-12-24 NOTE — Progress Notes (Signed)
Pt in room singing loudly, pt asked to quiet down as to not disturb other patients.

## 2019-12-24 NOTE — Progress Notes (Signed)
Pt continues to refuse her PO medications" I've been healed" pt accepted HS IM medication without incident. Pt continues to be confrontational with Probation officer .
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
## 2019-12-24 NOTE — BHH Suicide Risk Assessment (Signed)
[facility] Admission Suicide Risk Assessment Total Time spent with patient: 45 minutes Principal Problem: Schizoaffective type condition Diagnosis:  Active Problems:   Severe manic bipolar 1 disorder with psychotic behavior ([place])  Subjective Data: Readmitted for stabilization  Continued Clinical Symptoms:    The "Alcohol Use Disorders Identification Test", Guidelines for Use in Primary Care, Second Edition.  World Pharmacologist [facility]). Score between 0-7:  no or low risk or alcohol related problems. Score between 8-15:  moderate risk of alcohol related problems. Score between 16-19:  high risk of alcohol related problems. Score 20 or above:  warrants further diagnostic evaluation for alcohol dependence and treatment.   CLINICAL FACTORS:   Personality Disorders:   Cluster B Musculoskeletal: Strength & Muscle Tone: within normal limits Gait & Station: normal Patient leans: N/A  Psychiatric Specialty Exam: Physical Exam  Vitals reviewed. Constitutional: She appears well-developed and well-nourished.  Cardiovascular: Normal rate and regular rhythm.    Review of Systems  Constitutional: Negative.   Eyes: Negative.   Respiratory: Negative.   Endocrine: Negative.   Genitourinary: Negative.   Allergic/Immunologic: Negative.   Neurological: Positive for tremors.    There were no vitals taken for this visit.There is no height or weight on file to calculate BMI.  General Appearance: Casual  Eye Contact:  Minimal  Speech:  Clear and Coherent  Volume:  Decreased  Mood:  Anxious  Affect:  Congruent  Thought Process:  Irrelevant and Descriptions of Associations: Loose  Orientation:  Full (Time, Place, and Person) with prompts  Thought Content:  Illogical and Delusions  Suicidal Thoughts:  No  Homicidal Thoughts:  No  Memory:  Immediate;   Would not cooperate with 3 of 3 testing Recent;   Fair Remote;   Fair  Judgement:  Impaired  Insight:  Shallow  Psychomotor Activity:  EPS   Concentration:  Concentration: Poor and Attention Span: Poor  Recall:  [employer] of Knowledge:  Fair  Language:  Fair  Akathisia:  Negative  Handed:  Right  AIMS (if indicated):     Assets:  Physical Health  ADL's:  Intact  Cognition:  WNL  Sleep:  Number of Hours: 5.25     COGNITIVE FEATURES THAT CONTRIBUTE TO RISK:  Loss of executive function    SUICIDE RISK:   Minimal: No identifiable suicidal ideation.  Patients presenting with no risk factors but with morbid ruminations; may be classified as minimal risk based on the severity of the depressive symptoms  PLAN OF CARE: admit  I certify that inpatient services furnished can reasonably be expected to improve the patient's condition.   Johnn Hai, MD 12/24/2019, 12:05 PM

## 2019-12-25 NOTE — Progress Notes (Signed)
   12/25/19 2100  Psych Admission Type (Psych Patients Only)  Admission Status Involuntary  Psychosocial Assessment  Patient Complaints Irritability  Eye Contact Fair  Facial Expression Anxious  Affect Irritable  Speech Loud;Other (Comment);Rapid  Interaction Demanding  Motor Activity Slow  Appearance/Hygiene Disheveled  Behavior Characteristics Unwilling to participate  Mood Labile  Aggressive Behavior  Effect No apparent injury  Thought Process  Coherency Unable to assess;Circumstantial  Content Blaming others  Delusions UTA;WDL  Perception Hallucinations  Hallucination Auditory  Judgment Impaired  Confusion WDL  Danger to Self  Current suicidal ideation? Denies  Danger to Others  Danger to Others None reported or observed   Pt continues to be irritable and uncooperative, but pt has stayed in her room much of the evening

## 2019-12-25 NOTE — BHH Group Notes (Signed)
LCSW Group Therapy Notes  Date and Time: 12/25/2019 @ 1:30pm  Type of Therapy and Topic: Group Therapy: Core Beliefs  Participation Level: BHH PARTICIPATION LEVEL: Active  Description of Group: In this group patients will be encouraged to explore their negative and positive core beliefs about themselves, others, and the world. Each patient will be challenged to identify these beliefs and ways to challenge negative core beliefs. This group will be process-oriented, with patients participating in exploration of their own experiences as well as giving and receiving support and challenge from other group members.  Therapeutic Goals: 1. Patient will identify personal core beliefs, both negative and positive. 2. Patient will identify core beliefs relating to others, both negative and positive. 3. Patient will challenge their negative beliefs about themselves and others. 4. Patient will identify three changes they can make to replace negative core beliefs with positive beliefs.  Summary of Patient Progress  Due to the acuity of the unit, this group has been supplemented with worksheets.  Therapeutic Modalities: Cognitive Behavioral Therapy Solution Focused Therapy Motivational Interviewing     Ardelle Anton, MSW, [place]
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
## 2019-12-25 NOTE — Progress Notes (Signed)
    12/25/19 1100  Psych Admission Type (Psych Patients Only)  Admission Status Involuntary  Psychosocial Assessment  Eye Contact Fair  Facial Expression Anxious  Affect Irritable  Speech Loud;Other (Comment);Rapid  Interaction Demanding  Motor Activity Slow  Appearance/Hygiene Disheveled  Behavior Characteristics Unwilling to participate  Mood Labile  Aggressive Behavior  Effect No apparent injury  Thought Process  Coherency Unable to assess;Circumstantial  Content Blaming others  Delusions UTA;WDL  Perception Hallucinations  Hallucination Auditory  Judgment Impaired  Confusion WDL  Danger to Self  Current suicidal ideation? Denies  Danger to Others  Danger to Others None reported or observed

## 2019-12-25 NOTE — Progress Notes (Addendum)
Recreation Therapy Notes  Date: 1.28.21 Time: 0945 Location: 500 [address] Dayroom   Group Topic: Communication, Team Building, Problem Solving  Goal Area(s) Addresses:  Patient will effectively work with peer towards shared goal.  Patient will identify skill used to make activity successful.  Patient will identify how skills used during activity can be used to reach post d/c goals.   Behavioral Response: None  Intervention: STEM Activity   Activity: [employer]. Patients were provided the following materials: 5 drinking straws, 5 rubber bands, 5 paper clips, 2 index cards, and 2 drinking cups. Using the provided materials patients were asked to build a launching mechanisms to launch a ping pong ball approximately 12 feet. Patients were divided into teams of 3-5.   Education: Education officer, community, Dentist.   Education Outcome: Acknowledges education/In group clarification offered/Needs additional education.   Clinical Observations/Feedback:  Pt arrived late to group and was paired up with a peer.  Pt didn't help complete the launcher, pt mainly observed.  Pt was pleasant during group.    [name], LRT/CTRS        Ria Comment, Brettany Sydney A 12/25/2019 11:12 AM
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
## 2019-12-25 NOTE — Progress Notes (Signed)
[facility] MD Progress Note  12/25/2019 9:46 AM Paula Dickson  MRN:  [id number] Subjective:    This is the latest of multiple admissions here and elsewhere in the latest of multiple healthcare system encounters for Paula Dickson she is 38 suffers from a chronic schizoaffective/bipolar type condition complicated by implants issues, and her condition can range from near catatonia to volatility. She had a hospitalization in December related to lithium toxicity and since that time is been stabilized with Haldol and lamotrigine.  She came to our attention through the emergency department on 1/25 she was placed under petition for involuntary commitment.  She was described as noncompliant, not taking care of herself and hygiene, making multiple phone calls to family members, expressing delusional believes and even threatening to burn the house down with her self inside according to the initial complaint. She further presented with intense paranoia  Thus far today patient somewhat resistant to the interview process but does answer some questions.  Makes random statements denies hallucinations. Has been refusing medications therefore thus far untreated we will use IM medications.  Due to decline in status if not compliant  Principal Problem: Exacerbation of psychotic disorder Diagnosis: Active Problems:   Severe manic bipolar 1 disorder with psychotic behavior ([place])  Total Time spent with patient: 20 minutes  Past Psychiatric History: see eval  Past Medical History:  Past Medical History:  Diagnosis Date  . Bipolar affective disorder ([place])   . History of arthritis   . History of chicken pox   . History of depression   . History of genital warts   . history of heart murmur   . History of high blood pressure   . History of thyroid disease   . History of UTI   . Hypertension   . Low TSH level 07/13/2017  . Schizophrenia [facility])     Past Surgical History:  Procedure Laterality Date  . ABLATION  ON ENDOMETRIOSIS    . CYST REMOVAL NECK     around 11 years ago /benign  . MULTIPLE TOOTH EXTRACTIONS     Family History:  Family History  Problem Relation Age of Onset  . Arthritis Father   . Hyperlipidemia Father   . High blood pressure Father   . Diabetes Sister   . Diabetes Mother   . Diabetes Brother   . Mental illness Brother   . Alcohol abuse Paternal Uncle   . Alcohol abuse Paternal Grandfather   . Breast cancer Maternal Aunt   . Breast cancer Paternal Aunt   . High blood pressure Sister   . Mental illness Other        runs in family   Family Psychiatric  History: see eval Social History:  Social History   Substance and Sexual Activity  Alcohol Use No     Social History   Substance and Sexual Activity  Drug Use No    Social History   Socioeconomic History  . Marital status: Single    Spouse name: Not on file  . Number of children: Not on file  . Years of education: Not on file  . Highest education level: Not on file  Occupational History  . Not on file  Tobacco Use  . Smoking status: Never Smoker  . Smokeless tobacco: Never Used  Substance and Sexual Activity  . Alcohol use: No  . Drug use: No  . Sexual activity: Not Currently  Other Topics Concern  . Not on file  Social History Narrative  .  Not on file   Social Determinants of Health   Financial Resource Strain:   . Difficulty of Paying Living Expenses: Not on file  Food Insecurity:   . Worried About Charity fundraiser in the Last Year: Not on file  . Ran Out of Food in the Last Year: Not on file  Transportation Needs:   . Lack of Transportation (Medical): Not on file  . Lack of Transportation (Non-Medical): Not on file  Physical Activity:   . Days of Exercise per Week: Not on file  . Minutes of Exercise per Session: Not on file  Stress:   . Feeling of Stress : Not on file  Social Connections:   . Frequency of Communication with Friends and Family: Not on file  . Frequency of Social  Gatherings with Friends and Family: Not on file  . Attends Religious Services: Not on file  . Active Member of Clubs or Organizations: Not on file  . Attends [name] Meetings: Not on file  . Marital Status: Not on file   Sleep: Fair  Appetite:  Fair  Current Medications: Current Facility-Administered Medications  Medication Dose Route Frequency Provider Last Rate Last Admin  . acetaminophen (TYLENOL) tablet 650 mg  650 mg Oral Q6H PRN Rankin, Shuvon B, NP      . alum & mag hydroxide-simeth (MAALOX/MYLANTA) 200-200-20 MG/5ML suspension 30 mL  30 mL Oral Q4H PRN Rankin, Shuvon B, NP      . amLODipine (NORVASC) tablet 5 mg  5 mg Oral Daily Rankin, Shuvon B, NP      . benztropine (COGENTIN) tablet 2 mg  2 mg Oral BID Rankin, Shuvon B, NP      . carvedilol (COREG) tablet 25 mg  25 mg Oral BID WC Rankin, Shuvon B, NP      . haloperidol (HALDOL) tablet 10 mg  10 mg Oral BID Johnn Hai, MD      . haloperidol lactate (HALDOL) injection 10 mg  10 mg Intramuscular BID Johnn Hai, MD   10 mg at 12/25/19 0918  . haloperidol lactate (HALDOL) injection 5 mg  5 mg Intramuscular Q6H PRN Suella Broad, FNP   5 mg at 12/24/19 0420  . LORazepam (ATIVAN) injection 2 mg  2 mg Intramuscular Q6H Suella Broad, FNP   2 mg at 12/24/19 2116  . magnesium hydroxide (MILK OF MAGNESIA) suspension 30 mL  30 mL Oral Daily PRN Rankin, Shuvon B, NP      . temazepam (RESTORIL) capsule 30 mg  30 mg Oral QHS Rankin, Shuvon B, NP      . ziprasidone (GEODON) injection 20 mg  20 mg Intramuscular Q12H PRN Suella Broad, FNP        Lab Results:  Results for orders placed or performed during the hospital encounter of 12/22/19 (from the past 48 hour(s))  TSH     Status: Abnormal   Collection Time: 12/23/19 11:40 AM  Result Value Ref Range   TSH 0.020 (L) 0.350 - 4.500 uIU/mL    Comment: Performed by a 3rd Generation assay with a functional sensitivity of <=0.01 uIU/mL. Performed at  [facility], [place] [address], [place], [place] [address]   Lipid panel     Status: Abnormal   Collection Time: 12/23/19 11:40 AM  Result Value Ref Range   Cholesterol 120 0 - 200 mg/dL   Triglycerides 102 <150 mg/dL   HDL 40 (L) >40 mg/dL   Total CHOL/HDL Ratio 3.0  RATIO   VLDL 20 0 - 40 mg/dL   LDL Cholesterol 60 0 - 99 mg/dL    Comment:        Total Cholesterol/HDL:CHD Risk Coronary Heart Disease Risk Table                     Men   Women  1/2 Average Risk   3.4   3.3  Average Risk       5.0   4.4  2 X Average Risk   9.6   7.1  3 X Average Risk  23.4   11.0        Use the calculated Patient Ratio above and the CHD Risk Table to determine the patient's CHD Risk.        ATP III CLASSIFICATION (LDL):  <100     mg/dL   Optimal  100-129  mg/dL   Near or Above                    Optimal  130-159  mg/dL   Borderline  160-189  mg/dL   High  >190     mg/dL   Very High Performed at [facility] [address] [place], [place] [address]   Lamotrigine level     Status: None   Collection Time: 12/23/19 11:40 AM  Result Value Ref Range   Lamotrigine Lvl 2.6 2.0 - 20.0 ug/mL    Comment: (NOTE)                                Detection Limit = 1.0 Performed At: [facility] [place], [place] [id number] Rush Farmer MD [id number]     Blood Alcohol level:  Lab Results  Component Value Date   [facility] <10 12/22/2019   ETH <10 123XX123    Metabolic Disorder Labs: Lab Results  Component Value Date   HGBA1C 5.6 03/14/2019   MPG 114.02 03/14/2019   MPG 108.28 11/03/2018   Lab Results  Component Value Date   PROLACTIN 11.0 03/14/2019   Lab Results  Component Value Date   CHOL 120 12/23/2019   TRIG 102 12/23/2019   HDL 40 (L) 12/23/2019   CHOLHDL 3.0 12/23/2019   VLDL 20 12/23/2019   LDLCALC 60 12/23/2019   LDLCALC 78 03/14/2019    Physical Findings: AIMS:  , ,  ,  ,    CIWA:    COWS:      Musculoskeletal: Strength & Muscle Tone: within normal limits Gait & Station: normal Patient leans: N/A  Psychiatric Specialty Exam: Physical Exam  Review of Systems  There were no vitals taken for this visit.There is no height or weight on file to calculate BMI.  General Appearance: Casual  Eye Contact:  Good  Speech:  Clear and Coherent  Volume:  Normal  Mood:  Hypomanic at intervals lacking insight  Affect:  Congruent  Thought Process:  Irrelevant and Descriptions of Associations: Loose  Orientation:  Full (Time, Place, and Person)  Thought Content:  Illogical and Disjointed statements but no formed delusional material and denies hallucinations  Suicidal Thoughts:  No  Homicidal Thoughts:  No  Memory:  Immediate;   Poor Recent;   Poor  Judgement:  Impaired  Insight:  Lacking  Psychomotor Activity:  Decreased  Concentration:  Concentration: Poor and Attention Span: Poor  Recall:  Poor  Fund of Knowledge:  Poor  Language:  Fair  Akathisia:  NA  Handed:  Right  AIMS (if indicated):     Assets:  Physical Health Resilience  ADL's:  Intact  Cognition:  WNL  Sleep:  Number of Hours: 3.5     Treatment Plan Summary: Daily contact with patient to assess and evaluate symptoms and progress in treatment and Medication management  Continue IM medications when refusing orals No change in precautions [place], MD 12/25/2019, 9:46 AM

## 2019-12-26 MED ORDER — CLONAZEPAM 1 MG PO TABS
2.5000 mg | ORAL_TABLET | Freq: Every day | ORAL | Status: DC
  Administered 2019-12-26 – 2019-12-30 (×3): 2.5 mg via ORAL
  Filled 2019-12-26 (×5): qty 1

## 2019-12-26 NOTE — Progress Notes (Signed)
[facility] MD Progress Note  12/26/2019 9:49 AM Paula Dickson  MRN:  [id number] Subjective:    This is the latest of multiple admissions here and elsewhere in the latest of multiple healthcare system encounters for Paula Dickson she is 32 suffers from a chronic schizoaffective/bipolar type condition complicated by compliance issues, and her condition can range from near catatonia to volatility. She had a hospitalization in December related to lithium toxicity and since that time has been stabilized with Haldol and lamotrigine.  She came to our attention through the emergency department on 1/25 she was placed under petition for involuntary commitment.  She was described as noncompliant, not taking care of herself and hygiene, making multiple phone calls to family members, expressing delusional believes and even threatening to burn the house down with herself inside according to the initial complaint. She further presented with intense paranoia  Patient continues to refuse oral medications however she will accept the intramuscular versions of Haldol so forth.  She continues to be difficult, denies illness, and give sporadic cooperation with interviews and vague answers. Also has parkinsonian type tremor that has progressed from hand to full arms but denies that this is problematic. Denies current auditory or visual hallucinations or thoughts of self-harm but lacking insight into condition/chronic denial of illness  Principal Problem: Exacerbation with mania and psychosis Diagnosis: Active Problems:   Severe manic bipolar 1 disorder with psychotic behavior ([place])  Total Time spent with patient: 20 minutes  Past Psychiatric History: As discussed multiple prior encounters and admissions  Past Medical History:  Past Medical History:  Diagnosis Date  . Bipolar affective disorder ([place])   . History of arthritis   . History of chicken pox   . History of depression   . History of genital warts   .  history of heart murmur   . History of high blood pressure   . History of thyroid disease   . History of UTI   . Hypertension   . Low TSH level 07/13/2017  . Schizophrenia [facility])     Past Surgical History:  Procedure Laterality Date  . ABLATION ON ENDOMETRIOSIS    . CYST REMOVAL NECK     around 11 years ago /benign  . MULTIPLE TOOTH EXTRACTIONS     Family History:  Family History  Problem Relation Age of Onset  . Arthritis Father   . Hyperlipidemia Father   . High blood pressure Father   . Diabetes Sister   . Diabetes Mother   . Diabetes Brother   . Mental illness Brother   . Alcohol abuse Paternal Uncle   . Alcohol abuse Paternal Grandfather   . Breast cancer Maternal Aunt   . Breast cancer Paternal Aunt   . High blood pressure Sister   . Mental illness Other        runs in family   Family Psychiatric  History: No new data Social History:  Social History   Substance and Sexual Activity  Alcohol Use No     Social History   Substance and Sexual Activity  Drug Use No    Social History   Socioeconomic History  . Marital status: Single    Spouse name: Not on file  . Number of children: Not on file  . Years of education: Not on file  . Highest education level: Not on file  Occupational History  . Not on file  Tobacco Use  . Smoking status: Never Smoker  . Smokeless tobacco: Never Used  Substance and Sexual Activity  . Alcohol use: No  . Drug use: No  . Sexual activity: Not Currently  Other Topics Concern  . Not on file  Social History Narrative  . Not on file   Social Determinants of Health   Financial Resource Strain:   . Difficulty of Paying Living Expenses: Not on file  Food Insecurity:   . Worried About Charity fundraiser in the Last Year: Not on file  . Ran Out of Food in the Last Year: Not on file  Transportation Needs:   . Lack of Transportation (Medical): Not on file  . Lack of Transportation (Non-Medical): Not on file  Physical Activity:    . Days of Exercise per Week: Not on file  . Minutes of Exercise per Session: Not on file  Stress:   . Feeling of Stress : Not on file  Social Connections:   . Frequency of Communication with Friends and Family: Not on file  . Frequency of Social Gatherings with Friends and Family: Not on file  . Attends Religious Services: Not on file  . Active Member of Clubs or Organizations: Not on file  . Attends [name] Meetings: Not on file  . Marital Status: Not on file   Sleep: Less than 4 hours  Appetite:  Fair  Current Medications: Current Facility-Administered Medications  Medication Dose Route Frequency Provider Last Rate Last Admin  . acetaminophen (TYLENOL) tablet 650 mg  650 mg Oral Q6H PRN Rankin, Shuvon B, NP      . alum & mag hydroxide-simeth (MAALOX/MYLANTA) 200-200-20 MG/5ML suspension 30 mL  30 mL Oral Q4H PRN Rankin, Shuvon B, NP      . amLODipine (NORVASC) tablet 5 mg  5 mg Oral Daily Rankin, Shuvon B, NP   5 mg at 12/26/19 0801  . benztropine (COGENTIN) tablet 2 mg  2 mg Oral BID Rankin, Shuvon B, NP   2 mg at 12/26/19 0801  . carvedilol (COREG) tablet 25 mg  25 mg Oral BID WC Rankin, Shuvon B, NP   25 mg at 12/26/19 0801  . haloperidol (HALDOL) tablet 10 mg  10 mg Oral BID Johnn Hai, MD   10 mg at 12/26/19 0801  . haloperidol lactate (HALDOL) injection 10 mg  10 mg Intramuscular BID Johnn Hai, MD   10 mg at 12/25/19 1754  . haloperidol lactate (HALDOL) injection 5 mg  5 mg Intramuscular Q6H PRN Suella Broad, FNP   5 mg at 12/24/19 0420  . LORazepam (ATIVAN) injection 2 mg  2 mg Intramuscular Q6H Suella Broad, FNP   2 mg at 12/25/19 1753  . magnesium hydroxide (MILK OF MAGNESIA) suspension 30 mL  30 mL Oral Daily PRN Rankin, Shuvon B, NP      . temazepam (RESTORIL) capsule 30 mg  30 mg Oral QHS Rankin, Shuvon B, NP      . ziprasidone (GEODON) injection 20 mg  20 mg Intramuscular Q12H PRN Starkes-Perry, Gayland Curry, FNP        Lab Results:  No results found for this or any previous visit (from the past 48 hour(s)).  Blood Alcohol level:  Lab Results  Component Value Date   [facility] <10 12/22/2019   ETH <10 123XX123    Metabolic Disorder Labs: Lab Results  Component Value Date   HGBA1C 5.6 03/14/2019   MPG 114.02 03/14/2019   MPG 108.28 11/03/2018   Lab Results  Component Value Date   PROLACTIN 11.0 03/14/2019  Lab Results  Component Value Date   CHOL 120 12/23/2019   TRIG 102 12/23/2019   HDL 40 (L) 12/23/2019   CHOLHDL 3.0 12/23/2019   VLDL 20 12/23/2019   LDLCALC 60 12/23/2019   LDLCALC 78 03/14/2019    Musculoskeletal: Strength & Muscle Tone: within normal limits Gait & Station: normal Patient leans: N/A  Psychiatric Specialty Exam: Physical Exam  Review of Systems  Blood pressure (!) 0/0.There is no height or weight on file to calculate BMI.  General Appearance: Casual  Eye Contact:  Poor  Speech:  Pressured  Volume:  Decreased  Mood:  Generally calm in demeanor but hypomanic as far as thought process  Affect:  Non-Congruent  Thought Process:  Irrelevant and Descriptions of Associations: Circumstantial  Orientation:  Full (Time, Place, and Person)  Thought Content:  Expresses no formed delusions but offers no valuable information on interview but denies auditory or visual hallucinations  Suicidal Thoughts:  No  Homicidal Thoughts:  No  Memory:  Immediate;   Poor Recent;   Poor Remote;   Poor  Judgement:  Impaired  Insight:  Lacking  Psychomotor Activity:  Decreased  Concentration:  Concentration: Fair and Attention Span: Fair  Recall:  [employer] of Knowledge:  Poor  Language:  Fair  Akathisia:  Negative/positive for EPS however  Handed:  Right  AIMS (if indicated):     Assets:  Physical Health Resilience  ADL's:  Intact  Cognition:  WNL  Sleep:  Number of Hours: 3.5   Treatment Plan Summary: Daily contact with patient to assess and evaluate symptoms and progress in treatment and  Medication management Try different sleep medication as patient is simply not sleeping well, continue to encourage compliance, but as long as she is getting some antipsychotic weather p.o. or IM we will continue to give the IM as needed.  No change in precautions.  Monitor through the weekend   [facility], MD 12/26/2019, 9:49 AM

## 2019-12-26 NOTE — Progress Notes (Signed)
Recreation Therapy Notes  Date: 1.29.21 Time: 1000 Location: 500 Hall Dayroom  Group Topic: Wellness  Goal Area(s) Addresses:  Patient will define components of whole wellness. Patient will verbalize benefit of whole wellness.  Behavioral Response: Engaged  Intervention:  Music  Activity: Get Moving.  LRT and patients went through a series of stretches to get loosened up.  The members to the group took turns leading exercise or dance moves.  Patients could choose any appropriate movement to lead the group in as long as it got yu moving.  Education: Wellness, Dentist.   Education Outcome: Acknowledges education/In group clarification offered/Needs additional education.   Clinical Observations/Feedback: Pt was active and engaged during group session.  Pt was social with peers and smiled/laughed a lot during group.  Pt was appropriate during group session.   [name], LRT/CTRS     Ria Comment, Mylin Gignac A 12/26/2019 11:59 AM

## 2019-12-26 NOTE — Progress Notes (Signed)
   12/26/19 2000  Psych Admission Type (Psych Patients Only)  Admission Status Involuntary  Psychosocial Assessment  Patient Complaints Agitation  Eye Contact Fair  Facial Expression Anxious  Affect Irritable  Speech Loud;Other (Comment);Rapid  Interaction Demanding  Motor Activity Slow  Appearance/Hygiene Disheveled  Behavior Characteristics Unwilling to participate  Mood Labile  Aggressive Behavior  Effect No apparent injury  Thought Process  Coherency Unable to assess;Circumstantial  Content Blaming others  Delusions UTA;WDL  Perception Hallucinations  Hallucination Auditory  Judgment Impaired  Confusion WDL  Danger to Self  Current suicidal ideation? Denies  Danger to Others  Danger to Others None reported or observed   Pt controversial when asked about taking medications. Pt asked for print outs of the medication, pt asked to call her nurse from somewhere else for advice. Pt was asked if she requested these things when she took the medication earlier and pt responded no. Pt was then advised to discuss with the doctor. Pt continues to be confrontational and oppositional with Probation officer.

## 2019-12-26 NOTE — Progress Notes (Signed)
Adult Psychoeducational Group Note  Date:  12/26/2019 Time:  11:35 PM  Group Topic/Focus:  Wrap-Up Group:   The focus of this group is to help patients review their daily goal of treatment and discuss progress on daily workbooks.  Participation Level:  Minimal  Participation Quality:  Appropriate  Affect:  Defensive, Irritable, Lethargic, Not Congruent and Resistant  Cognitive:  Disorganized, Confused and Delusional  Insight: Lacking and Limited  Engagement in Group:  Lacking, Limited, Poor and Resistant  Modes of Intervention:  Discussion  Additional Comments:  Pt stated her goal for today was to focus on her treatment plan. Pt stated she accomplished her goal today. Pt stated her relationship with her family has improved since she was admitted. Pt rated her overall day a 7 out of 10. Pt stated her appetite was pretty good today. Pt stated her sleep last night was pretty good.  Pt stated she was in no physical pain today.  Pt deny auditory or visual hallucinations. Pt denies thoughts of harming herself or others. Pt stated she would alert staff if anything changes.  [name] 12/26/2019, 11:35 PM

## 2019-12-26 NOTE — Progress Notes (Signed)
   12/26/19 2300  Psych Admission Type (Psych Patients Only)  Admission Status Involuntary  Psychosocial Assessment  Patient Complaints Anxiety;Agitation  Eye Contact Fair  Facial Expression Anxious  Affect Irritable  Speech Loud;Other (Comment);Rapid  Interaction Demanding  Motor Activity Slow  Appearance/Hygiene Disheveled  Behavior Characteristics Unable to participate  Mood Labile;Suspicious  Aggressive Behavior  Effect No apparent injury  Thought Process  Coherency Unable to assess;Circumstantial  Content Blaming others  Delusions UTA;WDL  Perception Hallucinations  Hallucination Auditory  Judgment Impaired  Confusion WDL  Danger to Self  Current suicidal ideation? Denies  Danger to Others  Danger to Others None reported or observed   Pt continues to be oppositional with Probation officer. Pt did take HS PO medication after much encouragement.

## 2019-12-27 DIAGNOSIS — F312 Bipolar disorder, current episode manic severe with psychotic features: Principal | ICD-10-CM

## 2019-12-27 MED ORDER — CARBAMAZEPINE 100 MG PO CHEW
100.0000 mg | CHEWABLE_TABLET | Freq: Three times a day (TID) | ORAL | Status: DC
  Administered 2019-12-27 – 2019-12-31 (×14): 100 mg via ORAL
  Filled 2019-12-27 (×18): qty 1

## 2019-12-27 MED ORDER — POTASSIUM CHLORIDE CRYS ER 20 MEQ PO TBCR
20.0000 meq | EXTENDED_RELEASE_TABLET | Freq: Once | ORAL | Status: AC
  Administered 2019-12-27: 20 meq via ORAL
  Filled 2019-12-27: qty 1

## 2019-12-27 NOTE — Progress Notes (Signed)
Pt compliant with taking all scheduled medications this morning. However, the pt remains suspicious and hesitant when taking medications. Pt noted to have bilateral hand tremors. The stated she no longer has tremors because she's able to write. No hallucinations reported. Pt reported fair sleep. Pt denied SI/HI.     12/27/19 0800  Psych Admission Type (Psych Patients Only)  Admission Status Involuntary  Psychosocial Assessment  Patient Complaints Suspiciousness  Eye Contact Brief  Facial Expression Flat  Affect Appropriate to circumstance  Speech Pressured;Loud  Interaction Assertive  Motor Activity Slow  Appearance/Hygiene Unremarkable  Behavior Characteristics Appropriate to situation;Cooperative  Mood Labile  Aggressive Behavior  Effect No apparent injury  Thought Process  Coherency Circumstantial  Content Preoccupation  Delusions None reported or observed  Perception WDL  Hallucination None reported or observed  Judgment Limited  Confusion WDL  Danger to Self  Current suicidal ideation? Denies  Danger to Others  Danger to Others None reported or observed

## 2019-12-27 NOTE — [facility] Group Notes (Signed)
  [facility] LCSW Group Therapy Note  Date/Time:  12/27/2019 11:15AM-12:00PM  Type of Therapy and Topic:  Group Therapy:  Feelings About Hospitalization  Participation Level:  Active   Description of Group This process group involved patients discussing their feelings related to being hospitalized, as well as the benefits they see to being in the hospital.  These feelings and benefits were itemized.  The group then brainstormed specific ways in which they could seek those same benefits when they discharge and return home.  Therapeutic Goals Patient will identify and describe positive and negative feelings related to hospitalization Patient will verbalize benefits of hospitalization to themselves personally Patients will brainstorm together ways they can obtain similar benefits in the outpatient setting, identify barriers to wellness and possible solutions  Summary of Patient Progress:  The patient expressed her primary feelings about being hospitalized are "sketchy right now" because she had a lot of different theoughts going on.  However, she volunteered comments continuously.  Therapeutic Modalities Cognitive Behavioral Therapy Motivational Interviewing    Selmer Dominion, LCSW 12/27/2019, 9:11 AM

## 2019-12-27 NOTE — BHH Group Notes (Signed)
Adult Psychoeducational Group Note  Date:  12/27/2019 Time:  4:20 PM  Group Topic/Focus:  Goals Group:   The focus of this group is to help patients establish daily goals to achieve during treatment and discuss how the patient can incorporate goal setting into their daily lives to aide in recovery.  Participation Level:  Active  Participation Quality:  Intrusive at times  Affect:  Defensive at times.   Cognitive:  Delusional  Insight: Lacking  Engagement in Group:  Engaged at times  Modes of Intervention:  Education and Support  Additional Comments:  Pt verbalized some paranoid thoughts at times during the group  Bryson Dames A 12/27/2019, 4:20 PM

## 2019-12-27 NOTE — Progress Notes (Signed)
[facility] MD Progress Note  12/27/2019 11:12 AM Paula Dickson  MRN:  [id number] Subjective: Patient is 54 year old female with a past psychiatric history significant for bipolar disorder who presented to the [facility] emergency department on 12/22/2019 secondary to worsening mania with screaming, agitation, as well as psychotic symptoms.  Objective: Patient is seen and examined.  Patient is a 54 year old female with the above-stated past psychiatric history who is seen in follow-up.  She remains hyper religious.  She remains focused on the fact that "I have been healed by God".  She does not believe that she has any illness.  She believes that God is healed her from that.  She does not believe that she requires any medications.  She is mildly euphoric this morning.  Her blood pressure is elevated.  This morning its been 163/89, and repeat was 141/88.  Her pulse was 84 and she is afebrile.  She slept 5.5 hours last night.  She continues on Norvasc, carvedilol, Klonopin, Haldol.  She denied suicidal ideation.  She denied homicidal ideation.  She denied any psychotic symptoms.  Her speech is mildly pressured, and mildly increased rate.  Review of the electronic medical record revealed that she had previously been on lithium for her bipolar disorder, but had at 1 point become toxic to this and it was stopped previously.  Principal Problem: <principal problem not specified> Diagnosis: Active Problems:   Severe manic bipolar 1 disorder with psychotic behavior ([place])  Total Time spent with patient: 20 minutes  Past Psychiatric History: See admission H&P  Past Medical History:  Past Medical History:  Diagnosis Date  . Bipolar affective disorder ([place])   . History of arthritis   . History of chicken pox   . History of depression   . History of genital warts   . history of heart murmur   . History of high blood pressure   . History of thyroid disease   . History of UTI   .  Hypertension   . Low TSH level 07/13/2017  . Schizophrenia [facility])     Past Surgical History:  Procedure Laterality Date  . ABLATION ON ENDOMETRIOSIS    . CYST REMOVAL NECK     around 11 years ago /benign  . MULTIPLE TOOTH EXTRACTIONS     Family History:  Family History  Problem Relation Age of Onset  . Arthritis Father   . Hyperlipidemia Father   . High blood pressure Father   . Diabetes Sister   . Diabetes Mother   . Diabetes Brother   . Mental illness Brother   . Alcohol abuse Paternal Uncle   . Alcohol abuse Paternal Grandfather   . Breast cancer Maternal Aunt   . Breast cancer Paternal Aunt   . High blood pressure Sister   . Mental illness Other        runs in family   Family Psychiatric  History: See admission H&P Social History:  Social History   Substance and Sexual Activity  Alcohol Use No     Social History   Substance and Sexual Activity  Drug Use No    Social History   Socioeconomic History  . Marital status: Single    Spouse name: Not on file  . Number of children: Not on file  . Years of education: Not on file  . Highest education level: Not on file  Occupational History  . Not on file  Tobacco Use  . Smoking status: Never Smoker  .  Smokeless tobacco: Never Used  Substance and Sexual Activity  . Alcohol use: No  . Drug use: No  . Sexual activity: Not Currently  Other Topics Concern  . Not on file  Social History Narrative  . Not on file   Social Determinants of Health   Financial Resource Strain:   . Difficulty of Paying Living Expenses: Not on file  Food Insecurity:   . Worried About Charity fundraiser in the Last Year: Not on file  . Ran Out of Food in the Last Year: Not on file  Transportation Needs:   . Lack of Transportation (Medical): Not on file  . Lack of Transportation (Non-Medical): Not on file  Physical Activity:   . Days of Exercise per Week: Not on file  . Minutes of Exercise per Session: Not on file  Stress:   .  Feeling of Stress : Not on file  Social Connections:   . Frequency of Communication with Friends and Family: Not on file  . Frequency of Social Gatherings with Friends and Family: Not on file  . Attends Religious Services: Not on file  . Active Member of Clubs or Organizations: Not on file  . Attends [name] Meetings: Not on file  . Marital Status: Not on file   Additional Social History:                         Sleep: Fair  Appetite:  Fair  Current Medications: Current Facility-Administered Medications  Medication Dose Route Frequency Provider Last Rate Last Admin  . acetaminophen (TYLENOL) tablet 650 mg  650 mg Oral Q6H PRN Rankin, Shuvon B, NP      . alum & mag hydroxide-simeth (MAALOX/MYLANTA) 200-200-20 MG/5ML suspension 30 mL  30 mL Oral Q4H PRN Rankin, Shuvon B, NP      . amLODipine (NORVASC) tablet 5 mg  5 mg Oral Daily Rankin, Shuvon B, NP   5 mg at 12/27/19 0752  . benztropine (COGENTIN) tablet 2 mg  2 mg Oral BID Rankin, Shuvon B, NP   2 mg at 12/27/19 0752  . carvedilol (COREG) tablet 25 mg  25 mg Oral BID WC Rankin, Shuvon B, NP   25 mg at 12/27/19 0752  . clonazePAM (KLONOPIN) tablet 2.5 mg  2.5 mg Oral QHS Johnn Hai, MD   2.5 mg at 12/26/19 2053  . haloperidol (HALDOL) tablet 10 mg  10 mg Oral BID Johnn Hai, MD   10 mg at 12/27/19 [id number]  . haloperidol lactate (HALDOL) injection 10 mg  10 mg Intramuscular BID Johnn Hai, MD   10 mg at 12/25/19 1754  . haloperidol lactate (HALDOL) injection 5 mg  5 mg Intramuscular Q6H PRN Suella Broad, FNP   5 mg at 12/24/19 0420  . LORazepam (ATIVAN) injection 2 mg  2 mg Intramuscular Q6H Suella Broad, FNP   2 mg at 12/25/19 1753  . magnesium hydroxide (MILK OF MAGNESIA) suspension 30 mL  30 mL Oral Daily PRN Rankin, Shuvon B, NP      . ziprasidone (GEODON) injection 20 mg  20 mg Intramuscular Q12H PRN Starkes-Perry, Gayland Curry, FNP        Lab Results: No results found for this or any  previous visit (from the past 48 hour(s)).  Blood Alcohol level:  Lab Results  Component Value Date   [facility] <10 12/22/2019   ETH <10 [id number]    Metabolic Disorder Labs: Lab Results  Component  Value Date   HGBA1C 5.6 03/14/2019   MPG 114.02 03/14/2019   MPG 108.28 11/03/2018   Lab Results  Component Value Date   PROLACTIN 11.0 03/14/2019   Lab Results  Component Value Date   CHOL 120 12/23/2019   TRIG 102 12/23/2019   HDL 40 (L) 12/23/2019   CHOLHDL 3.0 12/23/2019   VLDL 20 12/23/2019   LDLCALC 60 12/23/2019   LDLCALC 78 03/14/2019    Physical Findings: AIMS:  , ,  ,  ,    CIWA:    COWS:     Musculoskeletal: Strength & Muscle Tone: within normal limits Gait & Station: normal Patient leans: N/A  Psychiatric Specialty Exam: Physical Exam  Nursing note and vitals reviewed. Constitutional: She is oriented to person, place, and time. She appears well-developed and well-nourished.  HENT:  Head: Normocephalic and atraumatic.  Respiratory: Effort normal.  Neurological: She is alert and oriented to person, place, and time.    Review of Systems  Blood pressure (!) 0/0.There is no height or weight on file to calculate BMI.  General Appearance: Casual  Eye Contact:  Good  Speech:  Pressured  Volume:  Increased  Mood:  Euphoric  Affect:  Labile  Thought Process:  Coherent and Descriptions of Associations: Circumstantial  Orientation:  Full (Time, Place, and Person)  Thought Content:  Delusions  Suicidal Thoughts:  No  Homicidal Thoughts:  No  Memory:  Immediate;   Poor Recent;   Poor Remote;   Poor  Judgement:  Impaired  Insight:  Lacking  Psychomotor Activity:  Increased  Concentration:  Concentration: Fair and Attention Span: Fair  Recall:  Poor  Fund of Knowledge:  Fair  Language:  Good  Akathisia:  Negative  Handed:  Right  AIMS (if indicated):     Assets:  Desire for Improvement Resilience  ADL's:  Intact  Cognition:  WNL  Sleep:  Number of  Hours: 5.5     Treatment Plan Summary: Daily contact with patient to assess and evaluate symptoms and progress in treatment, Medication management and Plan : Patient is seen and examined.  Patient is a 54 year old female with the above-stated past psychiatric history who is seen in follow-up.   Diagnosis: #1 schizoaffective disorder; bipolar type versus bipolar disorder, most recently manic, with psychotic features, #2 essential hypertension  Patient is seen in follow-up.  She remains manic and psychotic.  Notes from the nurses state that she was compliant with medications yesterday.  Review of her laboratories reveal that she still has a low potassium, mildly elevated glucose, but otherwise normal electrolytes.  CBC was normal.  Lamictal level was obtained, and it was only 2.6.  Her TSH is significantly low at 0.020.  I will order the rest of the thyroid panel today, and carvedilol is basically a beta-blocker.  I do not see any previous history of hyperthyroidism or treatment.  Her TSH has been low essentially for the last year.  A month ago it was in the normal range of 0's 0.651.  Prior to that it was 0.029 and 0.214.  Given her mania I think is important we clarify this.  I will order thyroid panel today.  EKG showed a sinus rhythm with a normal QTc interval.  I think she needs a mood stabilizer.  She was previously on the Lamictal, but was noncompliant with it.  I now feel great about restarting it given the potential problems that could occur with Lamictal.  I am going to start her  on Tegretol 100 mg p.o. 3 times daily, and we will titrate that during the course of the hospitalization.  No other changes in her medications at this point.  1.  Review thyroid panel and obtained today. 2.  Continue amlodipine 5 mg p.o. daily for essential hypertension. 3.  Continue Cogentin 2 mg p.o. twice daily for side effects of medication. 3.  Continue carvedilol 25 mg p.o. twice daily for hypertension. 4.   Continue clonazepam 2.5 mg p.o. nightly for insomnia. 5.  Continue Haldol 10 mg p.o. twice daily for psychosis. 6.  Start Tegretol 100 mg p.o. 3 times daily for mood stability. 7.  Disposition planning-in progress.  Sharma Covert, MD 12/27/2019, 11:12 AM

## 2019-12-27 NOTE — B[facility] Group Notes (Signed)
Adult Psychoeducational Group Note  Date:  12/27/2019 Time:  4:23 PM  Group Topic/Focus:  Making Healthy Choices:   The focus of this group is to help patients identify negative/unhealthy choices they were using prior to admission and identify positive/healthier coping strategies to replace them upon discharge.  Participation Level:  Active  Participation Quality:  Redirectable  Affect:  Blunted  Cognitive:  Lacking  Insight: Limited  Engagement in Group:  Distracting  Modes of Intervention:  Education, Role-play and Support  Additional Comments:  Pt's thinking is paranoid and at times she is intrussive  Bryson Dames A 12/27/2019, 4:23 PM

## 2019-12-28 LAB — BASIC METABOLIC PANEL
Anion gap: 8 (ref 5–15)
BUN: 25 mg/dL — ABNORMAL HIGH (ref 6–20)
CO2: 27 mmol/L (ref 22–32)
Calcium: 9.9 mg/dL (ref 8.9–10.3)
Chloride: 107 mmol/L (ref 98–111)
Creatinine, Ser: 0.97 mg/dL (ref 0.44–1.00)
GFR calc Af Amer: >60 mL/min (ref >60)
GFR calc non Af Amer: >60 mL/min (ref >60)
Glucose, Bld: 100 mg/dL — ABNORMAL HIGH (ref 70–99)
Potassium: 4 mmol/L (ref 3.5–5.1)
Sodium: 142 mmol/L (ref 135–145)

## 2019-12-28 LAB — TSH: TSH: 0.022 u[IU]/mL — ABNORMAL LOW (ref 0.350–4.500)

## 2019-12-28 NOTE — B[facility] Group Notes (Signed)
[facility] LCSW Group Therapy Note  Date/Time:  12/28/2019  11:00AM-12:00PM  Type of Therapy and Topic:  Group Therapy:  Music and Mood  Participation Level:  Active   Description of Group: In this process group, members listened to a variety of genres of music and identified that different types of music evoke different responses.  Patients were encouraged to identify music that was soothing for them and music that was energizing for them.  Patients discussed how this knowledge can help with wellness and recovery in various ways including managing depression and anxiety as well as encouraging healthy sleep habits.    Therapeutic Goals: Patients will explore the impact of different varieties of music on mood Patients will verbalize the thoughts they have when listening to different types of music Patients will identify music that is soothing to them as well as music that is energizing to them Patients will discuss how to use this knowledge to assist in maintaining wellness and recovery Patients will explore the use of music as a coping skill  Summary of Patient Progress:  At the beginning of group, patient expressed that she did not know how to describe her feelings other than to say "fuzzy."  She participated fully in group and frequently said she enjoyed the songs played.  At the end of group, patient expressed that she felt no change in her emotions despite enjoying the music.    Therapeutic Modalities: Solution Focused Brief Therapy Activity   Selmer Dominion, LCSW

## 2019-12-28 NOTE — BHH Suicide Risk Assessment (Signed)
[place] INPATIENT:  Family/Significant Other Suicide Prevention Education  Suicide Prevention Education:  Contact Attempts:  pt's friend, Pearletha Alfred (she is her [place]) [phone], (name of family member/significant other) has been identified by the patient as the family member/significant other with whom the patient will be residing, and identified as the person(s) who will aid the patient in the event of a mental health crisis.  With written consent from the patient, two attempts were made to provide suicide prevention education, prior to and/or following the patient's discharge.  We were unsuccessful in providing suicide prevention education.  A suicide education pamphlet was given to the patient to share with family/significant other.  Date and time of first attempt:  12/28/2019  /  10:00am Date and time of second attempt:12/28/2019 /  10:06 AM     Both phone calls stated that this number is not in service.  Berlin Hun Grossman-Orr 12/28/2019, 10:05 AM

## 2019-12-28 NOTE — Progress Notes (Signed)
   12/28/19 2146  COVID-19 Daily Checkoff  Have you had a fever (temp > 37.80C/100F)  in the past 24 hours?  No  If you have had runny nose, nasal congestion, sneezing in the past 24 hours, has it worsened? No  COVID-19 EXPOSURE  Have you traveled outside the state in the past 14 days? No  Have you been in contact with someone with a confirmed diagnosis of COVID-19 or PUI in the past 14 days without wearing appropriate PPE? No  Have you been living in the same home as a person with confirmed diagnosis of COVID-19 or a PUI (household contact)? No  Have you been diagnosed with COVID-19? No

## 2019-12-28 NOTE — Progress Notes (Signed)
   12/28/19 0810  Psych Admission Type (Psych Patients Only)  Admission Status Involuntary  Psychosocial Assessment  Patient Complaints Anxiety  Eye Contact Fair  Facial Expression Anxious;Pensive  Affect Anxious;Appropriate to circumstance  Speech Logical/coherent;Pressured  Interaction Assertive  Motor Activity Fidgety;Slow  Appearance/Hygiene In scrubs  Behavior Characteristics Cooperative;Appropriate to situation;Anxious;Guarded  Mood Anxious;Preoccupied;Pleasant  Thought Process  Coherency Circumstantial  Content Preoccupation  Delusions Religious  Perception WDL  Hallucination None reported or observed  Judgment Limited  Confusion None  Danger to Self  Current suicidal ideation? Denies  Danger to Others  Danger to Others None reported or observed

## 2019-12-28 NOTE — Progress Notes (Signed)
D.  Pt refused night Klonopin, stated that she can sleep without that.  Pt requested and received printout on Klonopin.  She did go to bed shortly thereafter.  A.  Support and encouragement offered, pt sleeping at midnight so no Ativan IM given.  R.  Pt remains safe on the unit, will continue to monitor.

## 2019-12-28 NOTE — Progress Notes (Signed)
[name] MD Progress Note  12/28/2019 12:28 PM Paula Dickson  MRN:  [id number] Subjective:  Patient is 54 year old female with a past psychiatric history significant for bipolar disorder who presented to the [facility] emergency department on 12/22/2019 secondary to worsening mania with screaming, agitation, as well as psychotic symptoms.  Objective: Patient is seen and examined.  Patient is a 54 year old female with the above-stated past psychiatric history who is seen in follow-up.  She is essentially unchanged from yesterday.  She remains hyper religious.  She is intrusive and at times inappropriate.  She has refused medications.  She wanted to know what the Tegretol was for, and I explained it to her.  She said "Lord no I am not going to take it, I have been healed".  She also asked if I "left her", I stated no that this was a professional relationship.  Her blood pressure this morning was stable, but later in the morning was elevated at 134/94.  Pulse is normal.  She is afebrile.  She only slept 4.75 hours last night.  Her repeat laboratories revealed normal creatinine, normal potassium.  Her TSH from 12/28/2019 remains significantly decreased.  When asked about previous history of thyroid problems she stated "yes have had problems with it, but I am fine with it".  Review of the electronic medical record revealed in an old history that her mother had a history of thyroid disease.  One of the old cardiology notes mentioned about avoiding beta-blockers because apparently she had shortness of breath from those in the past.  Principal Problem: <principal problem not specified> Diagnosis: Active Problems:   Severe manic bipolar 1 disorder with psychotic behavior ([place])  Total Time spent with patient: 20 minutes  Past Psychiatric History: See admission H&P  Past Medical History:  Past Medical History:  Diagnosis Date  . Bipolar affective disorder ([place])   . History of arthritis    . History of chicken pox   . History of depression   . History of genital warts   . history of heart murmur   . History of high blood pressure   . History of thyroid disease   . History of UTI   . Hypertension   . Low TSH level 07/13/2017  . Schizophrenia [facility])     Past Surgical History:  Procedure Laterality Date  . ABLATION ON ENDOMETRIOSIS    . CYST REMOVAL NECK     around 11 years ago /benign  . MULTIPLE TOOTH EXTRACTIONS     Family History:  Family History  Problem Relation Age of Onset  . Arthritis Father   . Hyperlipidemia Father   . High blood pressure Father   . Diabetes Sister   . Diabetes Mother   . Diabetes Brother   . Mental illness Brother   . Alcohol abuse Paternal Uncle   . Alcohol abuse Paternal Grandfather   . Breast cancer Maternal Aunt   . Breast cancer Paternal Aunt   . High blood pressure Sister   . Mental illness Other        runs in family   Family Psychiatric  History: See admission H&P Social History:  Social History   Substance and Sexual Activity  Alcohol Use No     Social History   Substance and Sexual Activity  Drug Use No    Social History   Socioeconomic History  . Marital status: Single    Spouse name: Not on file  . Number of children: Not  on file  . Years of education: Not on file  . Highest education level: Not on file  Occupational History  . Not on file  Tobacco Use  . Smoking status: Never Smoker  . Smokeless tobacco: Never Used  Substance and Sexual Activity  . Alcohol use: No  . Drug use: No  . Sexual activity: Not Currently  Other Topics Concern  . Not on file  Social History Narrative  . Not on file   Social Determinants of Health   Financial Resource Strain:   . Difficulty of Paying Living Expenses: Not on file  Food Insecurity:   . Worried About Charity fundraiser in the Last Year: Not on file  . Ran Out of Food in the Last Year: Not on file  Transportation Needs:   . Lack of Transportation  (Medical): Not on file  . Lack of Transportation (Non-Medical): Not on file  Physical Activity:   . Days of Exercise per Week: Not on file  . Minutes of Exercise per Session: Not on file  Stress:   . Feeling of Stress : Not on file  Social Connections:   . Frequency of Communication with Friends and Family: Not on file  . Frequency of Social Gatherings with Friends and Family: Not on file  . Attends Religious Services: Not on file  . Active Member of Clubs or Organizations: Not on file  . Attends [name] Meetings: Not on file  . Marital Status: Not on file   Additional Social History:                         Sleep: Poor  Appetite:  Good  Current Medications: Current Facility-Administered Medications  Medication Dose Route Frequency Provider Last Rate Last Admin  . acetaminophen (TYLENOL) tablet 650 mg  650 mg Oral Q6H PRN Rankin, Shuvon B, NP      . alum & mag hydroxide-simeth (MAALOX/MYLANTA) 200-200-20 MG/5ML suspension 30 mL  30 mL Oral Q4H PRN Rankin, Shuvon B, NP      . amLODipine (NORVASC) tablet 5 mg  5 mg Oral Daily Rankin, Shuvon B, NP   5 mg at 12/28/19 0807  . benztropine (COGENTIN) tablet 2 mg  2 mg Oral BID Rankin, Shuvon B, NP   2 mg at 12/28/19 0807  . carbamazepine (TEGRETOL) chewable tablet 100 mg  100 mg Oral TID Sharma Covert, MD   100 mg at 12/28/19 0807  . carvedilol (COREG) tablet 25 mg  25 mg Oral BID WC Rankin, Shuvon B, NP   25 mg at 12/28/19 0807  . clonazePAM (KLONOPIN) tablet 2.5 mg  2.5 mg Oral QHS Johnn Hai, MD   2.5 mg at 12/26/19 2053  . haloperidol (HALDOL) tablet 10 mg  10 mg Oral BID Johnn Hai, MD   10 mg at 12/28/19 [id number]  . haloperidol lactate (HALDOL) injection 10 mg  10 mg Intramuscular BID Johnn Hai, MD   10 mg at 12/25/19 1754  . haloperidol lactate (HALDOL) injection 5 mg  5 mg Intramuscular Q6H PRN Suella Broad, FNP   5 mg at 12/24/19 0420  . LORazepam (ATIVAN) injection 2 mg  2 mg  Intramuscular Q6H Suella Broad, FNP   2 mg at 12/25/19 1753  . magnesium hydroxide (MILK OF MAGNESIA) suspension 30 mL  30 mL Oral Daily PRN Rankin, Shuvon B, NP      . ziprasidone (GEODON) injection 20 mg  20 mg  Intramuscular Q12H PRN Suella Broad, FNP        Lab Results:  Results for orders placed or performed during the hospital encounter of 12/23/19 (from the past 48 hour(s))  TSH     Status: Abnormal   Collection Time: 12/28/19  6:43 AM  Result Value Ref Range   TSH 0.022 (L) 0.350 - 4.500 uIU/mL    Comment: Performed by a 3rd Generation assay with a functional sensitivity of <=0.01 uIU/mL. Performed at [facility], [place] [address] [place], [place] [id number]   Basic metabolic panel     Status: Abnormal   Collection Time: 12/28/19  6:43 AM  Result Value Ref Range   Sodium 142 135 - 145 mmol/L   Potassium 4.0 3.5 - 5.1 mmol/L   Chloride 107 98 - 111 mmol/L   CO2 27 22 - 32 mmol/L   Glucose, Bld 100 (H) 70 - 99 mg/dL   BUN 25 (H) 6 - 20 mg/dL   Creatinine, Ser 0.97 0.44 - 1.00 mg/dL   Calcium 9.9 8.9 - 10.3 mg/dL   GFR calc non Af Amer >60 >60 mL/min   GFR calc Af Amer >60 >60 mL/min   Anion gap 8 5 - 15    Comment: Performed at [facility], [place] [address] [place], [place] [address]    Blood Alcohol level:  Lab Results  Component Value Date   [facility] <10 12/22/2019   ETH <10 [id number]    Metabolic Disorder Labs: Lab Results  Component Value Date   HGBA1C 5.6 03/14/2019   MPG 114.02 03/14/2019   MPG 108.28 11/03/2018   Lab Results  Component Value Date   PROLACTIN 11.0 03/14/2019   Lab Results  Component Value Date   CHOL 120 12/23/2019   TRIG 102 12/23/2019   HDL 40 (L) 12/23/2019   CHOLHDL 3.0 12/23/2019   VLDL 20 12/23/2019   LDLCALC 60 12/23/2019   LDLCALC 78 03/14/2019    Physical Findings: AIMS: Facial and Oral Movements Muscles of Facial Expression: None, normal Lips and Perioral Area:  None, normal Jaw: None, normal Tongue: None, normal,Extremity Movements Upper (arms, wrists, hands, fingers): None, normal Lower (legs, knees, ankles, toes): None, normal, Trunk Movements Neck, shoulders, hips: None, normal, Overall Severity Severity of abnormal movements (highest score from questions above): None, normal Incapacitation due to abnormal movements: None, normal Patient's awareness of abnormal movements (rate only patient's report): No Awareness, Dental Status Current problems with teeth and/or dentures?: No Does patient usually wear dentures?: No  CIWA:    COWS:     Musculoskeletal: Strength & Muscle Tone: within normal limits Gait & Station: normal Patient leans: N/A  Psychiatric Specialty Exam: Physical Exam  Nursing note and vitals reviewed. Constitutional: She is oriented to person, place, and time. She appears well-developed and well-nourished.  HENT:  Head: Normocephalic and atraumatic.  Respiratory: Effort normal.  Neurological: She is alert and oriented to person, place, and time.    Review of Systems  Blood pressure (!) 0/0.There is no height or weight on file to calculate BMI.  General Appearance: Casual  Eye Contact:  Good  Speech:  Pressured  Volume:  Increased  Mood:  Anxious, Dysphoric and Irritable  Affect:  Labile  Thought Process:  Goal Directed and Descriptions of Associations: Tangential  Orientation:  Full (Time, Place, and Person)  Thought Content:  Delusions and Paranoid Ideation  Suicidal Thoughts:  No  Homicidal Thoughts:  No  Memory:  Immediate;  Poor Recent;   Poor Remote;   Poor  Judgement:  Impaired  Insight:  Lacking  Psychomotor Activity:  Increased  Concentration:  Concentration: Poor and Attention Span: Poor  Recall:  Poor  Fund of Knowledge:  Fair  Language:  Good  Akathisia:  Negative  Handed:  Right  AIMS (if indicated):     Assets:  Desire for Improvement Resilience  ADL's:  Intact  Cognition:  WNL  Sleep:   Number of Hours: 4.75     Treatment Plan Summary: Daily contact with patient to assess and evaluate symptoms and progress in treatment, Medication management and Plan : Patient is seen and examined.  Patient is a 54 year old female with the above-stated past psychiatric history who is seen in follow-up.   Diagnosis: #1 schizoaffective disorder; bipolar type versus bipolar disorder, most recently manic, with psychotic features, #2 essential hypertension, #3 hyperthyroidism  Patient is seen in follow-up.  She remains manic and psychotic.  She is noncompliant with her medications.  I have encouraged her to take those medications, but she still says that God is healed her.  We will continue to try and encourage her to take her medications.  Her TSH came back again very low at 0.022.  Her T3 and T4 are still pending.  I will add a TSI to make sure about Graves' disease.  Additionally I will add thyroglobulin in case this is Hashimoto's.  Either way once we have a full diagnosis then we may be able to treat with methimazole.  I will wait until her supplemental laboratories come back before I start that.  When Dr. Jake Samples returns tomorrow we can discuss forced medications if necessary.  1.  Increase amlodipine to 10 mg p.o. daily for hypertension. 2.  Continue Cogentin 2 mg p.o. twice daily for side effects of medication. 3.  Continue Tegretol 100 mg p.o. 3 times daily for mood stability. 4.  Continue carvedilol 25 mg p.o. twice daily for hypertension and possible symptomatic relief of hyperthyroidism. 5.  Continue clonazepam 2.5 mg p.o. nightly for insomnia. 6.  Continue haloperidol 10 mg p.o. twice daily for psychosis and mood stability. 7.  Continue Haldol injectable IM 10 mg daily for psychosis. 8.  Continue lorazepam 2 mg IM every 6 hours as needed agitation. 9.  Follow-up on T3, T4, TSI and thyroglobulin antibodies. 10.  Disposition planning-in progress.  Sharma Covert, MD 12/28/2019, 12:28  PM

## 2019-12-28 NOTE — Progress Notes (Signed)
Adult Psychoeducational Group Note  Date:  12/28/2019 Time:  9:43 PM  Group Topic/Focus:  Wrap-Up Group:   The focus of this group is to help patients review their daily goal of treatment and discuss progress on daily workbooks.  Participation Level:  Minimal  Participation Quality:  Inattentive and Resistant  Affect:  Blunted, Defensive, Irritable, Lethargic and Not Congruent  Cognitive:  Disorganized, Confused and Lacking  Insight: Lacking and Limited  Engagement in Group:  Lacking, Limited, Off Topic, Poor and Resistant  Modes of Intervention:  Discussion  Additional Comments:  Pt stated her goal for today was to focus on her treatment plan. Pt stated she accomplished her goal today. Pt stated her relationship with her family has improved since she was admitted. Pt stated she contacted a friend today. Pt did not want to give writer the name of friend she contact. Pt stated she gets in trouble giving friends numbers out. Writer made Pt aware that staff would not contact her friend. Pt rated her overall day a 7 out of 10. Pt stated her appetite was fair  today. Pt stated her sleep last night was pretty good. Pt stated she was in physical pain today. Pt stated she had pain in both knees. Pt's nurse was made aware of situation. Pt deny auditory or visual hallucinations. Pt denies thoughts of harming herself or others. Pt stated she would alert staff if anything changes.  [name] 12/28/2019, 9:43 PM

## 2019-12-28 NOTE — Progress Notes (Signed)
   12/28/19 2152  Psych Admission Type (Psych Patients Only)  Admission Status Involuntary  Psychosocial Assessment  Patient Complaints None  Eye Contact Fair  Facial Expression Other (Comment) (Bright, smiling)  Affect Appropriate to circumstance  Speech Logical/coherent  Interaction Assertive  Motor Activity Slow  Appearance/Hygiene In scrubs  Behavior Characteristics Appropriate to situation;Guarded  Mood Pleasant;Labile  Thought Process  Coherency Circumstantial  Content Religiosity  Delusions Religious  Perception WDL  Hallucination None reported or observed  Judgment Limited  Confusion None  Danger to Self  Current suicidal ideation? Denies  Danger to Others  Danger to Others None reported or observed

## 2019-12-28 NOTE — [facility] Group Notes (Signed)
Adult Psychoeducational Group Note  Date:  12/28/2019 Time:  12:27 PM  Group Topic/Focus:  Progressive Relaxation  Participation Level:  Active  Participation Quality:  Intrusive  Affect:  Flat  Cognitive:  Lacking  Insight: Limited  Engagement in Group: engaged and participates.   Modes of Intervention:  Activity and Support  Additional Comments:  Pt stated that her support system is God, her family and that she has learned that we all have to make adjustments.  [name] 12/28/2019, 12:27 PM

## 2019-12-29 LAB — T3 UPTAKE: T3 Uptake Ratio: 28 % (ref 24–39)

## 2019-12-29 LAB — T3, FREE: T3, Free: 4.7 pg/mL — ABNORMAL HIGH (ref 2.0–4.4)

## 2019-12-29 MED ORDER — LORAZEPAM 2 MG/ML IJ SOLN: 2.0000 mg | Freq: Four times a day (QID) | INTRAMUSCULAR | Status: DC | PRN

## 2019-12-29 NOTE — Progress Notes (Signed)
   12/29/19 2000  Psych Admission Type (Psych Patients Only)  Admission Status Involuntary  Psychosocial Assessment  Patient Complaints Anxiety  Eye Contact Fair  Facial Expression Other (Comment) (Bright, smiling)  Affect Appropriate to circumstance  Speech Logical/coherent  Interaction Assertive  Motor Activity Slow  Appearance/Hygiene In scrubs  Behavior Characteristics Cooperative  Mood Labile  Thought Process  Coherency Circumstantial  Content Religiosity  Delusions Religious  Perception WDL  Hallucination None reported or observed  Judgment Limited  Confusion None  Danger to Self  Current suicidal ideation? Denies  Danger to Others  Danger to Others None reported or observed   Pt continues to believe nothing is wrong with her and she has been healed

## 2019-12-29 NOTE — Progress Notes (Signed)
Recreation Therapy Notes  Date: 2.1.21 Time: 1000 Location: [address] Dayroom  Group Topic: Coping Skills  Goal Area(s) Addresses:  Patients will identify positive coping skills. Patients will identify benefit of using coping skills post d/c.  Behavioral Response: Engaged  Intervention: Worksheet, pencils  Activity: Mind Map.  LRT and patients filled in the first 8 boxes on the mind map together with instances (anger, survival, work, anxiety, school, emotional health, authority and finances where coping skills can be used.  Individually, patients then identified at least 3 coping skills that could be used for each.  LRT will write patient responses on the board.  Education: Radiographer, therapeutic, Dentist.   Education Outcome: Acknowledges understanding/In group clarification offered/Needs additional education.   Clinical Observations/Feedback: Pt was active and attentive.  Pt identified coping skills as reading, thinking, "seek God and pursue it", contribute, "be anxious for nothing", breath, face problems head on, have courage, positive self talk, take medication if needed, scale back and budget.     [name], LRT/CTRS   Ria Comment, Rhyen Mazariego A 12/29/2019 1:18 PM

## 2019-12-29 NOTE — Progress Notes (Signed)
[facility] MD Progress Note  12/29/2019 10:13 AM Paula Dickson  MRN:  [id number] Subjective:    Patient continues to deny/minimize/resist the suggestion of any symptoms.  Spends a great deal of time on the phone. Asks for repeated interviews but then only asks to leave and denies all symptoms.  Continued parkinsonian type tremor Blood pressure still up Principal Problem: Resistant to the notion of mania/psychosis Diagnosis: Active Problems:   Severe manic bipolar 1 disorder with psychotic behavior ([place])  Total Time spent with patient: 20 minutes  Past Psychiatric History: see eval  Past Medical History:  Past Medical History:  Diagnosis Date  . Bipolar affective disorder ([place])   . History of arthritis   . History of chicken pox   . History of depression   . History of genital warts   . history of heart murmur   . History of high blood pressure   . History of thyroid disease   . History of UTI   . Hypertension   . Low TSH level 07/13/2017  . Schizophrenia [employer])     Past Surgical History:  Procedure Laterality Date  . ABLATION ON ENDOMETRIOSIS    . CYST REMOVAL NECK     around 11 years ago /benign  . MULTIPLE TOOTH EXTRACTIONS     Family History:  Family History  Problem Relation Age of Onset  . Arthritis Father   . Hyperlipidemia Father   . High blood pressure Father   . Diabetes Sister   . Diabetes Mother   . Diabetes Brother   . Mental illness Brother   . Alcohol abuse Paternal Uncle   . Alcohol abuse Paternal Grandfather   . Breast cancer Maternal Aunt   . Breast cancer Paternal Aunt   . High blood pressure Sister   . Mental illness Other        runs in family   Family Psychiatric  History: see eval Social History:  Social History   Substance and Sexual Activity  Alcohol Use No     Social History   Substance and Sexual Activity  Drug Use No    Social History   Socioeconomic History  . Marital status: Single    Spouse name: Not on file  .  Number of children: Not on file  . Years of education: Not on file  . Highest education level: Not on file  Occupational History  . Not on file  Tobacco Use  . Smoking status: Never Smoker  . Smokeless tobacco: Never Used  Substance and Sexual Activity  . Alcohol use: No  . Drug use: No  . Sexual activity: Not Currently  Other Topics Concern  . Not on file  Social History Narrative  . Not on file   Social Determinants of Health   Financial Resource Strain:   . Difficulty of Paying Living Expenses: Not on file  Food Insecurity:   . Worried About Charity fundraiser in the Last Year: Not on file  . Ran Out of Food in the Last Year: Not on file  Transportation Needs:   . Lack of Transportation (Medical): Not on file  . Lack of Transportation (Non-Medical): Not on file  Physical Activity:   . Days of Exercise per Week: Not on file  . Minutes of Exercise per Session: Not on file  Stress:   . Feeling of Stress : Not on file  Social Connections:   . Frequency of Communication with Friends and Family: Not on file  .  Frequency of Social Gatherings with Friends and Family: Not on file  . Attends Religious Services: Not on file  . Active Member of Clubs or Organizations: Not on file  . Attends [name] Meetings: Not on file  . Marital Status: Not on file   Additional Social History:                         Sleep: Good  Appetite:  Good  Current Medications: Current Facility-Administered Medications  Medication Dose Route Frequency Provider Last Rate Last Admin  . acetaminophen (TYLENOL) tablet 650 mg  650 mg Oral Q6H PRN Rankin, Shuvon B, NP      . alum & mag hydroxide-simeth (MAALOX/MYLANTA) 200-200-20 MG/5ML suspension 30 mL  30 mL Oral Q4H PRN Rankin, Shuvon B, NP      . amLODipine (NORVASC) tablet 5 mg  5 mg Oral Daily Rankin, Shuvon B, NP   5 mg at 12/29/19 0801  . benztropine (COGENTIN) tablet 2 mg  2 mg Oral BID Rankin, Shuvon B, NP   2 mg at  12/29/19 0801  . carbamazepine (TEGRETOL) chewable tablet 100 mg  100 mg Oral TID Sharma Covert, MD   100 mg at 12/29/19 0802  . carvedilol (COREG) tablet 25 mg  25 mg Oral BID WC Rankin, Shuvon B, NP   25 mg at 12/29/19 0801  . clonazePAM (KLONOPIN) tablet 2.5 mg  2.5 mg Oral QHS Johnn Hai, MD   2.5 mg at 12/28/19 2026  . haloperidol (HALDOL) tablet 10 mg  10 mg Oral BID Johnn Hai, MD   10 mg at 12/29/19 0802  . haloperidol lactate (HALDOL) injection 10 mg  10 mg Intramuscular BID Johnn Hai, MD   10 mg at 12/25/19 1754  . haloperidol lactate (HALDOL) injection 5 mg  5 mg Intramuscular Q6H PRN Suella Broad, FNP   5 mg at 12/24/19 0420  . LORazepam (ATIVAN) injection 2 mg  2 mg Intramuscular Q6H PRN Lindon Romp A, NP      . magnesium hydroxide (MILK OF MAGNESIA) suspension 30 mL  30 mL Oral Daily PRN Rankin, Shuvon B, NP      . ziprasidone (GEODON) injection 20 mg  20 mg Intramuscular Q12H PRN Suella Broad, FNP        Lab Results:  Results for orders placed or performed during the hospital encounter of 12/23/19 (from the past 48 hour(s))  T3, free     Status: Abnormal   Collection Time: 12/28/19  6:43 AM  Result Value Ref Range   T3, Free 4.7 (H) 2.0 - 4.4 pg/mL    Comment: (NOTE) Performed At: [facility] [place], [place] [id number] Rush Farmer MD [id number]   T3 uptake     Status: None   Collection Time: 12/28/19  6:43 AM  Result Value Ref Range   T3 Uptake Ratio 28 24 - 39 %    Comment: (NOTE) Performed At: [facility] [place], [place] [id number] Rush Farmer MD [id number]   TSH     Status: Abnormal   Collection Time: 12/28/19  6:43 AM  Result Value Ref Range   TSH 0.022 (L) 0.350 - 4.500 uIU/mL    Comment: Performed by a 3rd Generation assay with a functional sensitivity of <=0.01 uIU/mL. Performed at [facility], [place] [address] [place], [place] [id number]    Basic metabolic panel     Status: Abnormal  Collection Time: 12/28/19  6:43 AM  Result Value Ref Range   Sodium 142 135 - 145 mmol/L   Potassium 4.0 3.5 - 5.1 mmol/L   Chloride 107 98 - 111 mmol/L   CO2 27 22 - 32 mmol/L   Glucose, Bld 100 (H) 70 - 99 mg/dL   BUN 25 (H) 6 - 20 mg/dL   Creatinine, Ser 0.97 0.44 - 1.00 mg/dL   Calcium 9.9 8.9 - 10.3 mg/dL   GFR calc non Af Amer >60 >60 mL/min   GFR calc Af Amer >60 >60 mL/min   Anion gap 8 5 - 15    Comment: Performed at [facility], [place] [address] [place], [place] [address]    Blood Alcohol level:  Lab Results  Component Value Date   [facility] <10 12/22/2019   ETH <10 [id number]    Metabolic Disorder Labs: Lab Results  Component Value Date   HGBA1C 5.6 03/14/2019   MPG 114.02 03/14/2019   MPG 108.28 11/03/2018   Lab Results  Component Value Date   PROLACTIN 11.0 03/14/2019   Lab Results  Component Value Date   CHOL 120 12/23/2019   TRIG 102 12/23/2019   HDL 40 (L) 12/23/2019   CHOLHDL 3.0 12/23/2019   VLDL 20 12/23/2019   LDLCALC 60 12/23/2019   LDLCALC 78 03/14/2019    Physical Findings: AIMS: Facial and Oral Movements Muscles of Facial Expression: None, normal Lips and Perioral Area: None, normal Jaw: None, normal Tongue: None, normal,Extremity Movements Upper (arms, wrists, hands, fingers): None, normal Lower (legs, knees, ankles, toes): None, normal, Trunk Movements Neck, shoulders, hips: None, normal, Overall Severity Severity of abnormal movements (highest score from questions above): None, normal Incapacitation due to abnormal movements: None, normal Patient's awareness of abnormal movements (rate only patient's report): No Awareness, Dental Status Current problems with teeth and/or dentures?: No Does patient usually wear dentures?: No  CIWA:    COWS:     Musculoskeletal: Strength & Muscle Tone: within normal limits Gait & Station: normal Patient leans: N/A  Psychiatric  Specialty Exam: Physical Exam  Review of Systems  Blood pressure (!) 134/107, pulse 83, temperature 98.4 F (36.9 C), temperature source Oral, SpO2 100 %.There is no height or weight on file to calculate BMI.  General Appearance: Disheveled  Eye Contact:  Good  Speech:  Clear and Coherent  Volume:  Increased  Mood:  Irritable and Hypomanic  Affect:  Restricted  Thought Process:  Goal Directed and Descriptions of Associations: Circumstantial  Orientation:  Full (Time, Place, and Person)  Thought Content:  Paranoid Ideation and Tangential  Suicidal Thoughts:  No  Homicidal Thoughts:  No  Memory:  Immediate;   Fair Recent;   Fair Remote;   Fair  Judgement:  Impaired  Insight:  Shallow  Psychomotor Activity:  EPS  Concentration:  Concentration: Fair and Attention Span: Fair  Recall:  [employer] of Knowledge:  Fair  Language:  Fair  Akathisia:  Negative  Handed:  Right  AIMS (if indicated):     Assets:  Leisure Time Physical Health Resilience  ADL's:  Intact  Cognition:  WNL  Sleep:  Number of Hours: 6.25     Treatment Plan Summary: Daily contact with patient to assess and evaluate symptoms and progress in treatment and Medication management  Continue cognitive therapy reality based therapy continue monitor for safety no change in precautions, no change in meds  Shellye Zandi, MD 12/29/2019, 10:13 AM

## 2019-12-29 NOTE — Tx Team (Signed)
Interdisciplinary Treatment and Diagnostic Plan Update  12/29/2019 Time of Session: 10:35am  Paula Dickson MRN: [id number]  Principal Diagnosis: <principal problem not specified>  Secondary Diagnoses: Active Problems:   Severe manic bipolar 1 disorder with psychotic behavior ([place])   Current Medications:  Current Facility-Administered Medications  Medication Dose Route Frequency Provider Last Rate Last Admin  . acetaminophen (TYLENOL) tablet 650 mg  650 mg Oral Q6H PRN Rankin, Shuvon B, NP      . alum & mag hydroxide-simeth (MAALOX/MYLANTA) 200-200-20 MG/5ML suspension 30 mL  30 mL Oral Q4H PRN Rankin, Shuvon B, NP      . amLODipine (NORVASC) tablet 5 mg  5 mg Oral Daily Rankin, Shuvon B, NP   5 mg at 12/29/19 0801  . benztropine (COGENTIN) tablet 2 mg  2 mg Oral BID Rankin, Shuvon B, NP   2 mg at 12/29/19 0801  . carbamazepine (TEGRETOL) chewable tablet 100 mg  100 mg Oral TID Sharma Covert, MD   100 mg at 12/29/19 1124  . carvedilol (COREG) tablet 25 mg  25 mg Oral BID WC Rankin, Shuvon B, NP   25 mg at 12/29/19 0801  . clonazePAM (KLONOPIN) tablet 2.5 mg  2.5 mg Oral QHS Johnn Hai, MD   2.5 mg at 12/28/19 2026  . haloperidol (HALDOL) tablet 10 mg  10 mg Oral BID Johnn Hai, MD   10 mg at 12/29/19 0802  . haloperidol lactate (HALDOL) injection 10 mg  10 mg Intramuscular BID Johnn Hai, MD   10 mg at 12/25/19 1754  . haloperidol lactate (HALDOL) injection 5 mg  5 mg Intramuscular Q6H PRN Suella Broad, FNP   5 mg at 12/24/19 0420  . LORazepam (ATIVAN) injection 2 mg  2 mg Intramuscular Q6H PRN Lindon Romp A, NP      . magnesium hydroxide (MILK OF MAGNESIA) suspension 30 mL  30 mL Oral Daily PRN Rankin, Shuvon B, NP      . ziprasidone (GEODON) injection 20 mg  20 mg Intramuscular Q12H PRN Suella Broad, FNP       PTA Medications: Medications Prior to Admission  Medication Sig Dispense Refill Last Dose  . amLODipine (NORVASC) 5 MG tablet Take 1  tablet (5 mg total) by mouth daily. 30 tablet 0   . benztropine (COGENTIN) 2 MG tablet Take 1 tablet (2 mg total) by mouth 2 (two) times daily. 60 tablet 0   . carvedilol (COREG) 25 MG tablet Take 1 tablet (25 mg total) by mouth 2 (two) times daily with a meal. 60 tablet 2   . haloperidol (HALDOL) 10 MG tablet Take 2 tablets (20 mg total) by mouth at bedtime. 60 tablet 0   . lamoTRIgine (LAMICTAL) 25 MG tablet Take 2 tablets (50 mg total) by mouth 2 (two) times daily. 120 tablet 0   . temazepam (RESTORIL) 30 MG capsule Take 1 capsule (30 mg total) by mouth at bedtime. 60 capsule 0     Patient Stressors:    Patient Strengths:    Treatment Modalities: Medication Management, Group therapy, Case management,  1 to 1 session with clinician, Psychoeducation, Recreational therapy.   Physician Treatment Plan for Primary Diagnosis: <principal problem not specified> Long Term Goal(s): Improvement in symptoms so as ready for discharge Improvement in symptoms so as ready for discharge   Short Term Goals: Ability to disclose and discuss suicidal ideas Ability to demonstrate self-control will improve Compliance with prescribed medications will improve Ability to identify triggers associated with  substance abuse/mental health issues will improve  Medication Management: Evaluate patient's response, side effects, and tolerance of medication regimen.  Therapeutic Interventions: 1 to 1 sessions, Unit Group sessions and Medication administration.  Evaluation of Outcomes: Progressing  Physician Treatment Plan for Secondary Diagnosis: Active Problems:   Severe manic bipolar 1 disorder with psychotic behavior ([place])  Long Term Goal(s): Improvement in symptoms so as ready for discharge Improvement in symptoms so as ready for discharge   Short Term Goals: Ability to disclose and discuss suicidal ideas Ability to demonstrate self-control will improve Compliance with prescribed medications will  improve Ability to identify triggers associated with substance abuse/mental health issues will improve     Medication Management: Evaluate patient's response, side effects, and tolerance of medication regimen.  Therapeutic Interventions: 1 to 1 sessions, Unit Group sessions and Medication administration.  Evaluation of Outcomes: Progressing   RN Treatment Plan for Primary Diagnosis: <principal problem not specified> Long Term Goal(s): Knowledge of disease and therapeutic regimen to maintain health will improve  Short Term Goals: Ability to participate in decision making will improve, Ability to verbalize feelings will improve, Ability to disclose and discuss suicidal ideas, Ability to identify and develop effective coping behaviors will improve and Compliance with prescribed medications will improve  Medication Management: RN will administer medications as ordered by provider, will assess and evaluate patient's response and provide education to patient for prescribed medication. RN will report any adverse and/or side effects to prescribing provider.  Therapeutic Interventions: 1 on 1 counseling sessions, Psychoeducation, Medication administration, Evaluate responses to treatment, Monitor vital signs and CBGs as ordered, Perform/monitor CIWA, COWS, AIMS and Fall Risk screenings as ordered, Perform wound care treatments as ordered.  Evaluation of Outcomes: Progressing   LCSW Treatment Plan for Primary Diagnosis: <principal problem not specified> Long Term Goal(s): Safe transition to appropriate next level of care at discharge, Engage patient in therapeutic group addressing interpersonal concerns.  Short Term Goals: Engage patient in aftercare planning with referrals and resources and Increase skills for wellness and recovery  Therapeutic Interventions: Assess for all discharge needs, 1 to 1 time with Social worker, Explore available resources and support systems, Assess for adequacy in  community support network, Educate family and significant other(s) on suicide prevention, Complete Psychosocial Assessment, Interpersonal group therapy.  Evaluation of Outcomes: Progressing   Progress in Treatment: Attending groups: Yes. Participating in groups: Yes. Taking medication as prescribed: Yes. Toleration medication: Yes. Family/Significant other contact made: No, will contact:  CSW attempted twice with pt's friend Patient understands diagnosis: No. Discussing patient identified problems/goals with staff: Yes. Medical problems stabilized or resolved: Yes. Denies suicidal/homicidal ideation: Yes. Issues/concerns per patient self-inventory: No. Other:   New problem(s) identified: No, Describe:  None  New Short Term/Long Term Goal(s): Medication stabilization, elimination of SI thoughts, and development of a comprehensive mental wellness plan.   Patient Goals:  1/27: "sign up for registration"   Discharge Plan or Barriers: Patient will be discharged home and will follow up with [facility] for medication management and therapy.   Reason for Continuation of Hospitalization: Delusions  Medical Issues Medication stabilization  Estimated Length of Stay: 2-3 days   Attendees: Patient: 12/29/2019   Physician: Dr. Johnn Hai, MD  12/29/2019   Nursing: Namon Cirri  12/29/2019   RN Care Manager: 12/29/2019  Social Worker: Ardelle Anton, LCSW  12/29/2019  Recreational Therapist:  12/29/2019   Other:  12/29/2019  Other:  12/29/2019  Other: 12/29/2019      Scribe for Treatment Team: Delana Meyer  Edythe Clarity, LCSW 12/29/2019 11:25 AM

## 2019-12-29 NOTE — [facility] Group Notes (Signed)
[facility] LCSW Group Therapy Note  Date/Time: 12/29/2019 @ 1:30pm  Type of Therapy and Topic:  Group Therapy:  Overcoming Obstacles  Participation Level:  [facility] PARTICIPATION LEVEL: Active  Description of Group:    In this group patients will be encouraged to explore what they see as obstacles to their own wellness and recovery. They will be guided to discuss their thoughts, feelings, and behaviors related to these obstacles. The group will process together ways to cope with barriers, with attention given to specific choices patients can make. Each patient will be challenged to identify changes they are motivated to make in order to overcome their obstacles. This group will be process-oriented, with patients participating in exploration of their own experiences as well as giving and receiving support and challenge from other group members.  Therapeutic Goals: 1. Patient will identify personal and current obstacles as they relate to admission. 2. Patient will identify barriers that currently interfere with their wellness or overcoming obstacles.  3. Patient will identify feelings, thought process and behaviors related to these barriers. 4. Patient will identify two changes they are willing to make to overcome these obstacles:    Summary of Patient Progress   Patient was active and engaged throughout group therapy today. Patient was able to identify a person and current obstacle which is being in the hospital and not being able to pay her bills right now. Patient was able to identify a current barrier that currently is interfering with the overcoming of her obstacle which is the lack of communication at the hospital and the lack of communication and understanding that she has of why she is in the hospital and [facility]. Patient was able to identify changes that she is willing to make to overcome her obstacle which is to take her medication and talk to the doctor.     Therapeutic Modalities:   Cognitive  Behavioral Therapy Solution Focused Therapy Motivational Interviewing Relapse Prevention Therapy   Ardelle Anton, LCSW

## 2019-12-29 NOTE — Progress Notes (Signed)
Spirituality group facilitated by Simone Curia, MDiv, B[facility].  Group Description:  Group focused on topic of hope.  Patients participated in facilitated discussion around topic, connecting with one another around experiences and definitions for hope.  Group members engaged with visual explorer photos, reflecting on what hope looks like for them today.  Group engaged in discussion around how their definitions of hope are present today in hospital.   Modalities: Psycho-social ed, Adlerian, Narrative, MI Patient Progress: Present during group, Paula Dickson was somewhat tangential in conversation - moving into speaking about the sheriff's office bringing her to [facility].  She was redirectable and engaged with other group members

## 2019-12-29 NOTE — Progress Notes (Signed)
Adult Psychoeducational Group Note  Date:  12/29/2019 Time:  11:59 PM   Group Topic/Focus:  Wrap-Up Group:   The focus of this group is to help patients review their daily goal of treatment and discuss progress on daily workbooks.  Participation Level:  Did Not Attend  Participation Quality:  Did Not Attend  Affect:  Did Not Attend  Cognitive:  Did Not Attend  Insight: None  Engagement in Group:  Did Not Attend  Modes of Intervention:  Did Not Attend  Additional Comments:  Pt did not attend evening wrap up group tonight.  [name] 12/29/2019, 11:59 PM

## 2019-12-30 LAB — THYROGLOBULIN ANTIBODY: Thyroglobulin Antibody: <1 IU/mL (ref 0.0–0.9)

## 2019-12-30 LAB — THYROID STIMULATING IMMUNOGLOBULIN: Thyroid Stimulating Immunoglob: <0.1 IU/L (ref 0.00–0.55)

## 2019-12-30 NOTE — Progress Notes (Signed)
   12/30/19 2200  Psych Admission Type (Psych Patients Only)  Admission Status Involuntary  Psychosocial Assessment  Patient Complaints Anxiety  Eye Contact Fair  Facial Expression Other (Comment) (Bright, smiling)  Affect Appropriate to circumstance  Speech Logical/coherent  Interaction Assertive  Motor Activity Slow  Appearance/Hygiene In scrubs  Behavior Characteristics Cooperative  Mood Labile  Thought Process  Coherency Circumstantial  Content Religiosity  Delusions Religious  Perception WDL  Hallucination None reported or observed  Judgment Limited  Confusion None  Danger to Self  Current suicidal ideation? Denies  Danger to Others  Danger to Others None reported or observed

## 2019-12-30 NOTE — Progress Notes (Signed)
Patient denies SI, HI and AVH this shift.  Patient has had no incidents of behavioral dyscontrol on the unit.  Patent has been compliant with medications, attended groups and engaged in unit activities.   Assess patient for safety offer medications as prescribe, offer 1:1 therapeutic talks.   Continue to monitor as planned.  Patient was able to contract for safety.

## 2019-12-30 NOTE — Progress Notes (Signed)
Recreation Therapy Notes  Date: 2.2.21 Time: 1000 Location: 500 Hall Dayroom  Group Topic: Anger Management  Goal Area(s) Addresses:  Patient will identify triggers for anger.  Patient will identify a situation that makes them angry.  Patient will identify what other emotions comes with anger.   Behavioral Response: Engaged  Intervention: Worksheet  Activity: Technical sales engineer.  Patients were to identify at least 6 things that get them angry.  Patients were to then rank those things on the thermometer from 1 (calm) to 10 (the angriest you can get). Patients then had to identify 5 positive coping skills to help deal with anger.  Education: Anger Management, Discharge Planning   Education Outcome: Acknowledges education/In group clarification offered/Needs additional education.   Clinical Observations/Feedback: Pt expressed the main thing that gets her upset is people not trying to understand her or just don't understand her.  Pt stated coping skills were reading the bible, go to her room, talk to them or read something inspirational.     [name], LRT/CTRS    [name] A 12/30/2019 11:17 AM

## 2019-12-30 NOTE — Progress Notes (Signed)
Patient ID: Paula Dickson, female   DOB: 04-Nov-1966, 54 y.o.   MRN: [id number]   CSW spoke with patient as she discussed her wants to go be able to go back to her home. Patient discussed her father passing away and her living alone. She stated that people often come to her house to mess with the cars. Patient stated that she feels much better due to the medications she has been put on here while in the hospital. CSW identified to patient that her hands are not shaking as much. Patient reports that she is able to have better hand movement and walk more now. Patient reports that the lithium made her not be able to move her hands and walk and she feels much better now that it is out of her system. She states that she feels good enough to go home.

## 2019-12-30 NOTE — Progress Notes (Signed)
[facility] MD Progress Note  12/30/2019 1:21 PM Paula Dickson  MRN:  [id number] Subjective:   Patient continues to dictate care stating she wants to stay "until Wednesday" she also states she will take medications because she is "tired of shots" she still lacks insight but is showing some improvement overall.  Denies having a psychiatric illness but will meet as halfway and begin taking medications more reliably by her report, denies auditory or visual hallucinations Principal Problem: Bipolar with psychosis Diagnosis: Active Problems:   Severe manic bipolar 1 disorder with psychotic behavior ([place])  Total Time spent with patient: 20 minutes  Past Psychiatric History: See eval  Past Medical History:  Past Medical History:  Diagnosis Date  . Bipolar affective disorder ([place])   . History of arthritis   . History of chicken pox   . History of depression   . History of genital warts   . history of heart murmur   . History of high blood pressure   . History of thyroid disease   . History of UTI   . Hypertension   . Low TSH level 07/13/2017  . Schizophrenia [facility])     Past Surgical History:  Procedure Laterality Date  . ABLATION ON ENDOMETRIOSIS    . CYST REMOVAL NECK     around 11 years ago /benign  . MULTIPLE TOOTH EXTRACTIONS     Family History:  Family History  Problem Relation Age of Onset  . Arthritis Father   . Hyperlipidemia Father   . High blood pressure Father   . Diabetes Sister   . Diabetes Mother   . Diabetes Brother   . Mental illness Brother   . Alcohol abuse Paternal Uncle   . Alcohol abuse Paternal Grandfather   . Breast cancer Maternal Aunt   . Breast cancer Paternal Aunt   . High blood pressure Sister   . Mental illness Other        runs in family   Family Psychiatric  History: See eval Social History:  Social History   Substance and Sexual Activity  Alcohol Use No     Social History   Substance and Sexual Activity  Drug Use No    Social  History   Socioeconomic History  . Marital status: Single    Spouse name: Not on file  . Number of children: Not on file  . Years of education: Not on file  . Highest education level: Not on file  Occupational History  . Not on file  Tobacco Use  . Smoking status: Never Smoker  . Smokeless tobacco: Never Used  Substance and Sexual Activity  . Alcohol use: No  . Drug use: No  . Sexual activity: Not Currently  Other Topics Concern  . Not on file  Social History Narrative  . Not on file   Social Determinants of Health   Financial Resource Strain:   . Difficulty of Paying Living Expenses: Not on file  Food Insecurity:   . Worried About Charity fundraiser in the Last Year: Not on file  . Ran Out of Food in the Last Year: Not on file  Transportation Needs:   . Lack of Transportation (Medical): Not on file  . Lack of Transportation (Non-Medical): Not on file  Physical Activity:   . Days of Exercise per Week: Not on file  . Minutes of Exercise per Session: Not on file  Stress:   . Feeling of Stress : Not on file  Social Connections:   .  Frequency of Communication with Friends and Family: Not on file  . Frequency of Social Gatherings with Friends and Family: Not on file  . Attends Religious Services: Not on file  . Active Member of Clubs or Organizations: Not on file  . Attends [name] Meetings: Not on file  . Marital Status: Not on file   Additional Social History:                         Sleep: Fair  Appetite:  Fair  Current Medications: Current Facility-Administered Medications  Medication Dose Route Frequency Provider Last Rate Last Admin  . acetaminophen (TYLENOL) tablet 650 mg  650 mg Oral Q6H PRN Rankin, Shuvon B, NP      . alum & mag hydroxide-simeth (MAALOX/MYLANTA) 200-200-20 MG/5ML suspension 30 mL  30 mL Oral Q4H PRN Rankin, Shuvon B, NP      . amLODipine (NORVASC) tablet 5 mg  5 mg Oral Daily Rankin, Shuvon B, NP   5 mg at 12/30/19  0824  . benztropine (COGENTIN) tablet 2 mg  2 mg Oral BID Rankin, Shuvon B, NP   2 mg at 12/30/19 0824  . carbamazepine (TEGRETOL) chewable tablet 100 mg  100 mg Oral TID Sharma Covert, MD   100 mg at 12/30/19 0825  . carvedilol (COREG) tablet 25 mg  25 mg Oral BID WC Rankin, Shuvon B, NP   25 mg at 12/30/19 0824  . clonazePAM (KLONOPIN) tablet 2.5 mg  2.5 mg Oral QHS Johnn Hai, MD   2.5 mg at 12/28/19 2026  . haloperidol (HALDOL) tablet 10 mg  10 mg Oral BID Johnn Hai, MD   10 mg at 12/30/19 [id number]  . haloperidol lactate (HALDOL) injection 10 mg  10 mg Intramuscular BID Johnn Hai, MD   10 mg at 12/25/19 1754  . haloperidol lactate (HALDOL) injection 5 mg  5 mg Intramuscular Q6H PRN Suella Broad, FNP   5 mg at 12/24/19 0420  . LORazepam (ATIVAN) injection 2 mg  2 mg Intramuscular Q6H PRN Lindon Romp A, NP      . magnesium hydroxide (MILK OF MAGNESIA) suspension 30 mL  30 mL Oral Daily PRN Rankin, Shuvon B, NP      . ziprasidone (GEODON) injection 20 mg  20 mg Intramuscular Q12H PRN Starkes-Perry, Gayland Curry, FNP        Lab Results: No results found for this or any previous visit (from the past 48 hour(s)).  Blood Alcohol level:  Lab Results  Component Value Date   ETH <10 12/22/2019   ETH <10 123XX123    Metabolic Disorder Labs: Lab Results  Component Value Date   HGBA1C 5.6 03/14/2019   MPG 114.02 03/14/2019   MPG 108.28 11/03/2018   Lab Results  Component Value Date   PROLACTIN 11.0 03/14/2019   Lab Results  Component Value Date   CHOL 120 12/23/2019   TRIG 102 12/23/2019   HDL 40 (L) 12/23/2019   CHOLHDL 3.0 12/23/2019   VLDL 20 12/23/2019   LDLCALC 60 12/23/2019   LDLCALC 78 03/14/2019    Physical Findings: AIMS: Facial and Oral Movements Muscles of Facial Expression: None, normal Lips and Perioral Area: None, normal Jaw: None, normal Tongue: None, normal,Extremity Movements Upper (arms, wrists, hands, fingers): None, normal Lower (legs,  knees, ankles, toes): None, normal, Trunk Movements Neck, shoulders, hips: None, normal, Overall Severity Severity of abnormal movements (highest score from questions above): None, normal Incapacitation due  to abnormal movements: None, normal Patient's awareness of abnormal movements (rate only patient's report): No Awareness, Dental Status Current problems with teeth and/or dentures?: No Does patient usually wear dentures?: No  CIWA:    COWS:     Musculoskeletal: Strength & Muscle Tone: within normal limits Gait & Station: normal Patient leans: N/A  Psychiatric Specialty Exam: Physical Exam  Review of Systems  Blood pressure (!) 134/92, pulse 80, temperature 98.4 F (36.9 C), temperature source Oral, SpO2 100 %.There is no height or weight on file to calculate BMI.  General Appearance: Casual  Eye Contact:  Good  Speech:  Clear and Coherent  Volume:  Normal  Mood:  Irritable  Affect:  Constricted  Thought Process:  Linear and Descriptions of Associations: Circumstantial  Orientation:  Full (Time, Place, and Person)  Thought Content:  Denies auditory or visual hallucinations denies thoughts of self-harm  Suicidal Thoughts:  No  Homicidal Thoughts:  No  Memory:  Immediate;   Fair Recent;   Poor Remote;   Fair  Judgement:  Fair  Insight:  Fair  Psychomotor Activity:  Normal  Concentration:  Concentration: Fair and Attention Span: Fair  Recall:  [employer] of Knowledge:  Fair  Language:  Good  Akathisia:  Negative  Handed:  Right  AIMS (if indicated):     Assets:  Resilience Social Support  ADL's:  Intact  Cognition:  WNL  Sleep:  Number of Hours: 9.25     Treatment Plan Summary: Daily contact with patient to assess and evaluate symptoms and progress in treatment and Medication management  Showing improvement no change in precautions needed, continue oral antipsychotic and mood stabilizer therapy  Perlie Scheuring, MD 12/30/2019, 1:21 PM

## 2019-12-31 MED ORDER — HALOPERIDOL DECANOATE 100 MG/ML IM SOLN: 150.0000 mg | INTRAMUSCULAR | 11 refills | Status: DC

## 2019-12-31 MED ORDER — HALOPERIDOL 20 MG PO TABS: 20.0000 mg | ORAL_TABLET | Freq: Every day | ORAL | 2 refills | Status: DC

## 2019-12-31 MED ORDER — CARBAMAZEPINE 100 MG PO CHEW: CHEWABLE_TABLET | ORAL | 2 refills | Status: DC

## 2019-12-31 MED ORDER — BENZTROPINE MESYLATE 2 MG PO TABS: 2.0000 mg | ORAL_TABLET | Freq: Two times a day (BID) | ORAL | 2 refills | Status: DC

## 2019-12-31 MED ORDER — HALOPERIDOL DECANOATE 100 MG/ML IM SOLN
150.0000 mg | INTRAMUSCULAR | Status: DC
  Administered 2019-12-31: 150 mg via INTRAMUSCULAR
  Filled 2019-12-31: qty 1.5

## 2019-12-31 NOTE — Progress Notes (Signed)
D:  Patient's self inventory sheet, patient sleeps good, no sleep medication.  Good appetite, normal energy level, good concentration.  Rated depression 2, denied hopeless and anxiety.  Denied withdrawals.  Checked irritability.  Denied SI.  Physical problems, pain, rash.  Physical pain, worst pain #4, knees.  Goal is discharge.  Plans to talk to MD.  No discharge plans. A:  Medications administered per MD orders.  Emotional support and encouragement given patient. R:  Denied SI and HI, contracts for safety.  Denied A/V hallucinations.  Safety maintained with 15 minute checks.

## 2019-12-31 NOTE — Progress Notes (Signed)
Recreation Therapy Notes  INPATIENT RECREATION TR PLAN  Patient Details Name: Paula Dickson MRN: [id number] DOB: [birth year] Today's Date: 12/31/2019  Rec Therapy Plan Is patient appropriate for Therapeutic Recreation?: Yes Treatment times per week: about 3 days Estimated Length of Stay: 5-7 days TR Treatment/Interventions: Group participation (Comment)  Discharge Criteria Pt will be discharged from therapy if:: Discharged Treatment plan/goals/alternatives discussed and agreed upon by:: Patient/family  Discharge Summary Short term goals set: See patient care plan Short term goals met: Complete Progress toward goals comments: Groups attended Which groups?: Self-esteem, Coping skills, Wellness, Leisure education, Anger management(Team building) Reason goals not met: None Therapeutic equipment acquired: N/A Reason patient discharged from therapy: Discharge from hospital Pt/family agrees with progress & goals achieved: Yes Date patient discharged from therapy: 12/31/19     [name], LRT/CTRS  Ria Comment, [employer] 12/31/2019, 11:24 AM

## 2019-12-31 NOTE — Progress Notes (Signed)
Discharge Note:  Patient discharged home with sister.  Patient denied SI and HI.  Denied A/V hallucinations.  Denied pain.  Suicide prevention information given and discussed with patient who stated she understood and had no questions.  Patient stated she received all he belongings, clothing, toiletries, etc.  Patient stated she appreciated all assistance received from [facility] staff.  All required discharge information given to patient at discharge.

## 2019-12-31 NOTE — Tx Team (Signed)
Interdisciplinary Treatment and Diagnostic Plan Update  12/31/2019 Time of Session: 10:35am Paula Dickson MRN: [id number]  Principal Diagnosis: <principal problem not specified>  Secondary Diagnoses: Active Problems:   Severe manic bipolar 1 disorder with psychotic behavior ([place])   Current Medications:  Current Facility-Administered Medications  Medication Dose Route Frequency Provider Last Rate Last Admin  . acetaminophen (TYLENOL) tablet 650 mg  650 mg Oral Q6H PRN Rankin, Shuvon B, NP      . alum & mag hydroxide-simeth (MAALOX/MYLANTA) 200-200-20 MG/5ML suspension 30 mL  30 mL Oral Q4H PRN Rankin, Shuvon B, NP      . amLODipine (NORVASC) tablet 5 mg  5 mg Oral Daily Rankin, Shuvon B, NP   5 mg at 12/31/19 0731  . benztropine (COGENTIN) tablet 2 mg  2 mg Oral BID Rankin, Shuvon B, NP   2 mg at 12/31/19 0731  . carbamazepine (TEGRETOL) chewable tablet 100 mg  100 mg Oral TID Sharma Covert, MD   100 mg at 12/31/19 1102  . carvedilol (COREG) tablet 25 mg  25 mg Oral BID WC Rankin, Shuvon B, NP   25 mg at 12/31/19 0731  . clonazePAM (KLONOPIN) tablet 2.5 mg  2.5 mg Oral QHS Johnn Hai, MD   2.5 mg at 12/30/19 2053  . haloperidol (HALDOL) tablet 10 mg  10 mg Oral BID Johnn Hai, MD   10 mg at 12/31/19 0731  . haloperidol decanoate (HALDOL DECANOATE) 100 MG/ML injection 150 mg  150 mg Intramuscular Q30 days Johnn Hai, MD      . haloperidol lactate (HALDOL) injection 10 mg  10 mg Intramuscular BID Johnn Hai, MD   10 mg at 12/25/19 1754  . haloperidol lactate (HALDOL) injection 5 mg  5 mg Intramuscular Q6H PRN Suella Broad, FNP   5 mg at 12/24/19 0420  . LORazepam (ATIVAN) injection 2 mg  2 mg Intramuscular Q6H PRN Lindon Romp A, NP      . magnesium hydroxide (MILK OF MAGNESIA) suspension 30 mL  30 mL Oral Daily PRN Rankin, Shuvon B, NP      . ziprasidone (GEODON) injection 20 mg  20 mg Intramuscular Q12H PRN Suella Broad, FNP       PTA  Medications: Medications Prior to Admission  Medication Sig Dispense Refill Last Dose  . amLODipine (NORVASC) 5 MG tablet Take 1 tablet (5 mg total) by mouth daily. 30 tablet 0   . carvedilol (COREG) 25 MG tablet Take 1 tablet (25 mg total) by mouth 2 (two) times daily with a meal. 60 tablet 2   . haloperidol (HALDOL) 10 MG tablet Take 2 tablets (20 mg total) by mouth at bedtime. 60 tablet 0   . lamoTRIgine (LAMICTAL) 25 MG tablet Take 2 tablets (50 mg total) by mouth 2 (two) times daily. 120 tablet 0   . temazepam (RESTORIL) 30 MG capsule Take 1 capsule (30 mg total) by mouth at bedtime. 60 capsule 0   . [DISCONTINUED] benztropine (COGENTIN) 2 MG tablet Take 1 tablet (2 mg total) by mouth 2 (two) times daily. 60 tablet 0     Patient Stressors:    Patient Strengths:    Treatment Modalities: Medication Management, Group therapy, Case management,  1 to 1 session with clinician, Psychoeducation, Recreational therapy.   Physician Treatment Plan for Primary Diagnosis: <principal problem not specified> Long Term Goal(s): Improvement in symptoms so as ready for discharge Improvement in symptoms so as ready for discharge   Short Term Goals: Ability  to disclose and discuss suicidal ideas Ability to demonstrate self-control will improve Compliance with prescribed medications will improve Ability to identify triggers associated with substance abuse/mental health issues will improve  Medication Management: Evaluate patient's response, side effects, and tolerance of medication regimen.  Therapeutic Interventions: 1 to 1 sessions, Unit Group sessions and Medication administration.  Evaluation of Outcomes: Adequate for Discharge  Physician Treatment Plan for Secondary Diagnosis: Active Problems:   Severe manic bipolar 1 disorder with psychotic behavior ([place])  Long Term Goal(s): Improvement in symptoms so as ready for discharge Improvement in symptoms so as ready for discharge   Short Term  Goals: Ability to disclose and discuss suicidal ideas Ability to demonstrate self-control will improve Compliance with prescribed medications will improve Ability to identify triggers associated with substance abuse/mental health issues will improve     Medication Management: Evaluate patient's response, side effects, and tolerance of medication regimen.  Therapeutic Interventions: 1 to 1 sessions, Unit Group sessions and Medication administration.  Evaluation of Outcomes: Adequate for Discharge   RN Treatment Plan for Primary Diagnosis: <principal problem not specified> Long Term Goal(s): Knowledge of disease and therapeutic regimen to maintain health will improve  Short Term Goals: Ability to participate in decision making will improve, Ability to verbalize feelings will improve, Ability to disclose and discuss suicidal ideas, Ability to identify and develop effective coping behaviors will improve and Compliance with prescribed medications will improve  Medication Management: RN will administer medications as ordered by provider, will assess and evaluate patient's response and provide education to patient for prescribed medication. RN will report any adverse and/or side effects to prescribing provider.  Therapeutic Interventions: 1 on 1 counseling sessions, Psychoeducation, Medication administration, Evaluate responses to treatment, Monitor vital signs and CBGs as ordered, Perform/monitor CIWA, COWS, AIMS and Fall Risk screenings as ordered, Perform wound care treatments as ordered.  Evaluation of Outcomes: Adequate for Discharge   LCSW Treatment Plan for Primary Diagnosis: <principal problem not specified> Long Term Goal(s): Safe transition to appropriate next level of care at discharge, Engage patient in therapeutic group addressing interpersonal concerns.  Short Term Goals: Engage patient in aftercare planning with referrals and resources and Increase skills for wellness and  recovery  Therapeutic Interventions: Assess for all discharge needs, 1 to 1 time with Social worker, Explore available resources and support systems, Assess for adequacy in community support network, Educate family and significant other(s) on suicide prevention, Complete Psychosocial Assessment, Interpersonal group therapy.  Evaluation of Outcomes: Adequate for Discharge   Progress in Treatment: Attending groups: Yes. Participating in groups: Yes. Taking medication as prescribed: Yes. Toleration medication: Yes. Family/Significant other contact made: No, will contact:  CSW attempted twice with pt's friend Patient understands diagnosis: No. Discussing patient identified problems/goals with staff: Yes. Medical problems stabilized or resolved: Yes. Denies suicidal/homicidal ideation: Yes. Issues/concerns per patient self-inventory: No. Other:   New problem(s) identified: No, Describe:  None  New Short Term/Long Term Goal(s): Medication stabilization, elimination of SI thoughts, and development of a comprehensive mental wellness plan.   Patient Goals: "Sign up for registration"   Discharge Plan or Barriers: Patient will be discharged home and will follow up with [facility] for medication management and therapy.   Reason for Continuation of Hospitalization: Patient is discharging today.   Estimated Length of Stay: Patient is discharging today.   Attendees: Patient: 12/31/2019   Physician: Dr. Johnn Hai, MD  12/31/2019   Nursing: Joellen Jersey 12/31/2019   RN Care Manager: 12/31/2019   Social Worker: Delana Meyer  Duayne Cal  12/31/2019   Recreational Therapist:  12/31/2019   Other:  12/31/2019  Other:  12/31/2019   Other: 12/31/2019     Scribe for Treatment Team: Trecia Rogers, LCSW 12/31/2019 1:53 PM

## 2019-12-31 NOTE — Progress Notes (Signed)
Recreation Therapy Notes  Date: 2.3.21 Time: 1000 Location: 500 Hall Dayroom  Group Topic: Leisure Education  Goal Area(s) Addresses:  Patient will identify positive leisure activities.  Patient will identify one positive benefit of participation in leisure activities.   Behavioral Response: Engaged  Intervention: Leisure Group Game  Activity: LRT and patients played a Recruitment consultant.  Each person will get a slip of paper with an activity on it out of the container and draw it on the board.  The rest of the group will attempt the guess the activity being drawn.  The person with guesses correctly gets the next turn.   Education:  Leisure Education, Dentist  Education Outcome: Acknowledges education/In group clarification offered/Needs additional education  Clinical Observations/Feedback: Pt was active and engaged.  Pt was patient as peers drew their pictures. Pt actively attempted to guess what the drawings were.  Pt also was active in drawing when it was her turn.    [name], LRT/CTRS     Ria Comment, Evola Hollis A 12/31/2019 11:09 AM

## 2019-12-31 NOTE — Plan of Care (Signed)
Pt was able to focus on task without prompting from staff at completion of recreation therapy group sessions.   [name], LRT/CTRS

## 2019-12-31 NOTE — Progress Notes (Signed)
  [facility] Adult Case Management Discharge Plan :  Will you be returning to the same living situation after discharge:  Yes,  Pt's home At discharge, do you have transportation home?: Yes,  pt's family Do you have the ability to pay for your medications: Yes,  medicare  Release of information consent forms completed and in the chart;  Patient's signature needed at discharge.  Patient to Follow up at: Follow-up Information    [name] Follow up on 01/01/2020.   Why: You are scheduled for an appointment on 01/01/20 at 12:00 pm.  This will be a virtual appointment.  Please have your insurance information and your discharge summary available.   Contact information: [place] [address] [phone]           Next level of care provider has access to [facility] and Suicide Prevention discussed: No.; CSW attempted twice with pt's friend.     Has patient been referred to the Quitline?: N/A patient is not a smoker  Patient has been referred for addiction treatment: Yes  Trecia Rogers, LCSW 12/31/2019, 9:12 AM

## 2019-12-31 NOTE — [facility] Suicide Risk Assessment (Signed)
[facility] Discharge Suicide Risk Assessment   Principal Problem: <principal problem not specified> Discharge Diagnoses: Active Problems:   Severe manic bipolar 1 disorder with psychotic behavior ([place])   Total Time spent with patient: 45 minutes Musculoskeletal: Strength & Muscle Tone: within normal limits Gait & Station: normal Patient leans: N/A  Psychiatric Specialty Exam: Physical Exam  Review of Systems  Blood pressure 116/72, pulse 79, temperature 99 F (37.2 C), temperature source Oral, SpO2 100 %.There is no height or weight on file to calculate BMI.  General Appearance: Casual  Eye Contact:  Good  Speech:  Clear and Coherent  Volume:  Normal  Mood:  Irritable  Affect:  Constricted  Thought Process:  Coherent and Goal Directed  Orientation:  Full (Time, Place, and Person)  Thought Content:  Denies auditory or visual hallucinations there is always a degree of denial of illness that we simply cannot penetrate  Suicidal Thoughts:  No  Homicidal Thoughts:  No  Memory:  Recent;   Fair  Judgement:  Poor  Insight:  Shallow  Psychomotor Activity:  Normal  Concentration:  Concentration: Fair and Attention Span: Poor  Recall:  [employer] of Knowledge:  Fair  Language:  Fair  Akathisia:  Not noted but does have EPS/bilateral hand tremors  Handed:  Right  AIMS (if indicated):     Assets:  Communication Skills Desire for Improvement  ADL's:  Intact  Cognition:  WNL  Sleep:  Number of Hours: 5.5      Mental Status Per Nursing Assessment::   On Admission:     Demographic Factors:  Unemployed  Loss Factors: Decrease in vocational status  Historical Factors: NA  Risk Reduction Factors:   Sense of responsibility to family and Religious beliefs about death  Continued Clinical Symptoms:  Schizophrenia:   Paranoid or undifferentiated type  Cognitive Features That Contribute To Risk:  Thought constriction (tunnel vision)    Suicide Risk:  Minimal: No identifiable  suicidal ideation.  Patients presenting with no risk factors but with morbid ruminations; may be classified as minimal risk based on the severity of the depressive symptoms  Follow-up Information    [name] Follow up on 01/01/2020.   Why: You are scheduled for an appointment on 01/01/20 at 12:00 pm.  This will be a virtual appointment.  Please have your insurance information and your discharge summary available.   Contact information: [address] [place] [address] [phone]           Plan Of Care/Follow-up recommendations:  Activity:  full  Shayleen Eppinger, MD 12/31/2019, 12:23 PM

## 2019-12-31 NOTE — Discharge Summary (Signed)
Physician Discharge Summary Note  Patient:  Paula Dickson is an 55 y.o., female MRN:  [id number] DOB:  02/22/1966 Patient phone:  [phone] (home)  Patient address:   [place] [address],  Total Time spent with patient: 45 minutes  Date of Admission:  12/23/2019 Date of Discharge: 12/31/2019  Reason for Admission:   This is the latest of multiple admissions here and elsewhere in the latest of multiple healthcare system encounters for Paula Dickson she is 85 suffers from a chronic schizoaffective/bipolar type condition complicated by compliance issues, and her condition can range from near catatonia to volatility. She had a hospitalization in December related to lithium toxicity and since that time is been stabilized with Haldol and lamotrigine.   She came to our attention through the emergency department on 1/25 she was placed under petition for involuntary commitment.  She was described as noncompliant, not taking care of herself and hygiene, making multiple phone calls to family members, expressing delusional believes and even threatening to burn the house down with herself inside according to the initial complaint. She further presented with intense paranoia  At the present time she is oriented to person place situation she is more compliant but expressing paranoia towards family but will not elaborate she reports auditory hallucinations but will not elaborate and she is evasive in the interview.  Clearly need of inpatient stabilization.  According the assessment team note of 1/25:  Paula Dickson a 54 y.o.femalewho was brought to [facility] via the [employer] under an IVC that was completed by her sister. According to nursing notes, pt was screaming, loud, and in handcuffs when she arrived with the [facility], though it was noted that she was *not* physically aggressive. The IVC states:  "[name] states: The respondent has been diagnosed with  bipolar/schizophrenia disorder. The respondent has not been eating, sleeping, or tending to her personal hygiene. The respondent refuses to take her medication, she doesn't believe she needs treatment. The respondent has been calling her brother stating she is going to burn down her house and sit in the home to watch it burn. The respondent is hallucinating, she believes her deceased parents are still living and claims she cooks dinner for them every day. The respondent has grown aggressive; she [curses] individuals out, [pushes] them, [calls] them names, and [threatens] to harm them (this includes petitioner). The respondent calls petitioner, among others, 10 to 15 times a day. The respondent has fallen several times but [refuses] to let the physical therapist in the home for her sessions. The respondent has been known to laugh, scream, and cry all within a very short period of time."  Pt states she has been experiencing "too much." Clinician prompted for pt to elaborate on this, and pt shared that she had asked a lady to make some pictures but the lady is now deceased. She also noted she spends time with another lady who is developmentally delayed, though she enjoys the time they spend together. Clinician requested pt share what occurred to result in her being brought to the hospital tonight. Pt stated that clinician didn't listen and that she missed the point. Clinician suggested they continue with the assessment, as the questions were primarily "yes/no" answers, so pt wouldn't have to provide information other than that.  Pt denied she is currently experiencing SI, though she acknowledges she has experienced SI in the past and that she has attempted to kill herself in the past; she stated she can't remember  when this incident took place. Pt states she has been hospitalized in the past, though she states she cannot remember when or where these hospitalizations occurred (though pt later stated she went to  [facility] in the past, and she has past hx of being admitted to [facility]). Pt denies she has a plan to kill herself. Pt denies a current or prior hx of HI.  At this point in the assessment, pt became frustrated with the questions and asked to see what clinician was writing. Clinician held her paper up to the camera in an effort to allow pt to read what she had written, and pt tried to read it but was unable; pt then reached for the paper and told clinician to hand the paper to her. Clinician explained she was in a different room and that she was unable to hand the paper to her. Pt again told clinician to give her the paper so she could read it and clinician explained that she was on the tv and could not hand her things. Pt then expressed an understanding and stated she forgot and thought clinician was sitting down.  Pt again became upset and was yelling at clinician, making statements about the hospital keeping her here forever, [place] killing her, and [place] putting her in the incinerator. Despite clinician's attempts to talk to pt, she was unwilling/unable to be rationalized with and continued to argue and yell. Clinician ended the assessment, as pt was unwilling to complete any more of it and attempting to do so would have escalated her further.  Pt is oriented x2; she was unsure of the date (she thought it was mid-February or possibly around the 30th of January, 2021) and she could/would not provide information regarding why she was at the hospital; MSE was essentially UTA. Pt's recent and remote memory was UTA. Pt was irritable and argumentative throughout the assessment process. Pt's insight, judgement, and impulse control is fair - poor at this time.  Principal Problem: <principal problem not specified> Discharge Diagnoses: Active Problems:   Severe manic bipolar 1 disorder with psychotic behavior [facility])   Past Psychiatric History: exst  Past Medical History:  Past Medical History:  Diagnosis Date   . Bipolar affective disorder ([place])   . History of arthritis   . History of chicken pox   . History of depression   . History of genital warts   . history of heart murmur   . History of high blood pressure   . History of thyroid disease   . History of UTI   . Hypertension   . Low TSH level 07/13/2017  . Schizophrenia [facility])     Past Surgical History:  Procedure Laterality Date  . ABLATION ON ENDOMETRIOSIS    . CYST REMOVAL NECK     around 11 years ago /benign  . MULTIPLE TOOTH EXTRACTIONS     Family History:  Family History  Problem Relation Age of Onset  . Arthritis Father   . Hyperlipidemia Father   . High blood pressure Father   . Diabetes Sister   . Diabetes Mother   . Diabetes Brother   . Mental illness Brother   . Alcohol abuse Paternal Uncle   . Alcohol abuse Paternal Grandfather   . Breast cancer Maternal Aunt   . Breast cancer Paternal Aunt   . High blood pressure Sister   . Mental illness Other        runs in family   Family Psychiatric  History: see  eval Social History:  Social History   Substance and Sexual Activity  Alcohol Use No     Social History   Substance and Sexual Activity  Drug Use No    Social History   Socioeconomic History  . Marital status: Single    Spouse name: Not on file  . Number of children: Not on file  . Years of education: Not on file  . Highest education level: Not on file  Occupational History  . Not on file  Tobacco Use  . Smoking status: Never Smoker  . Smokeless tobacco: Never Used  Substance and Sexual Activity  . Alcohol use: No  . Drug use: No  . Sexual activity: Not Currently  Other Topics Concern  . Not on file  Social History Narrative  . Not on file   Social Determinants of Health   Financial Resource Strain:   . Difficulty of Paying Living Expenses: Not on file  Food Insecurity:   . Worried About Charity fundraiser in the Last Year: Not on file  . Ran Out of Food in the Last Year: Not on file   Transportation Needs:   . Lack of Transportation (Medical): Not on file  . Lack of Transportation (Non-Medical): Not on file  Physical Activity:   . Days of Exercise per Week: Not on file  . Minutes of Exercise per Session: Not on file  Stress:   . Feeling of Stress : Not on file  Social Connections:   . Frequency of Communication with Friends and Family: Not on file  . Frequency of Social Gatherings with Friends and Family: Not on file  . Attends Religious Services: Not on file  . Active Member of Clubs or Organizations: Not on file  . Attends [name] Meetings: Not on file  . Marital Status: Not on file    Hospital Course:    Patient was readmitted and she was initially noncompliant with medications requiring IM medications, but with enough reality based therapy and enough force medication she did comply and become more appropriate. Compliance is always an issue and we did administer/order long-acting injectable prior to discharge and if she refuses it we will simply delay discharge until she receives it but appointed this dictation and has not come from the pharmacy.  As usual she tended to dictate care she spent a lot of time on the phone talking to various acquaintances or businesses but nothing seem delusional by the date of the third at this point in time she was alert and oriented to person place time situation not exact date but knew the month and year. She denied auditory and visual hallucinations, her self-care had improved. She had some parkinsonism/EPS and basically mild bilateral tremor but overall was stable in mood and affect and without acute dangerousness.  Physical Findings: AIMS: Facial and Oral Movements Muscles of Facial Expression: None, normal Lips and Perioral Area: None, normal Jaw: None, normal Tongue: None, normal,Extremity Movements Upper (arms, wrists, hands, fingers): None, normal Lower (legs, knees, ankles, toes): None, normal, Trunk  Movements Neck, shoulders, hips: None, normal, Overall Severity Severity of abnormal movements (highest score from questions above): None, normal Incapacitation due to abnormal movements: None, normal Patient's awareness of abnormal movements (rate only patient's report): No Awareness, Dental Status Current problems with teeth and/or dentures?: No Does patient usually wear dentures?: No  CIWA:    COWS:     Musculoskeletal: Strength & Muscle Tone: within normal limits Gait & Station: normal  Patient leans: N/A  Psychiatric Specialty Exam: Physical Exam  Review of Systems  Blood pressure 116/72, pulse 79, temperature 99 F (37.2 C), temperature source Oral, SpO2 100 %.There is no height or weight on file to calculate BMI.  General Appearance: Casual  Eye Contact:  Good  Speech:  Clear and Coherent  Volume:  Normal  Mood:  Irritable  Affect:  Constricted  Thought Process:  Coherent and Goal Directed  Orientation:  Full (Time, Place, and Person)  Thought Content:  Denies auditory or visual hallucinations there is always a degree of denial of illness that we simply cannot penetrate  Suicidal Thoughts:  No  Homicidal Thoughts:  No  Memory:  Recent;   Fair  Judgement:  Poor  Insight:  Shallow  Psychomotor Activity:  Normal  Concentration:  Concentration: Fair and Attention Span: Poor  Recall:  [employer] of Knowledge:  Fair  Language:  Fair  Akathisia:  Not noted but does have EPS/bilateral hand tremors  Handed:  Right  AIMS (if indicated):     Assets:  Communication Skills Desire for Improvement  ADL's:  Intact  Cognition:  WNL  Sleep:  Number of Hours: 5.5        Has this patient used any form of tobacco in the last 30 days? (Cigarettes, Smokeless Tobacco, Cigars, and/or Pipes) Yes, No  Blood Alcohol level:  Lab Results  Component Value Date   ETH <10 12/22/2019   ETH <10 123XX123    Metabolic Disorder Labs:  Lab Results  Component Value Date   HGBA1C 5.6  03/14/2019   MPG 114.02 03/14/2019   MPG 108.28 11/03/2018   Lab Results  Component Value Date   PROLACTIN 11.0 03/14/2019   Lab Results  Component Value Date   CHOL 120 12/23/2019   TRIG 102 12/23/2019   HDL 40 (L) 12/23/2019   CHOLHDL 3.0 12/23/2019   VLDL 20 12/23/2019   LDLCALC 60 12/23/2019   [place] 78 03/14/2019    See Psychiatric Specialty Exam and Suicide Risk Assessment completed by Attending Physician prior to discharge.  Discharge destination:  Home  Is patient on multiple antipsychotic therapies at discharge:  No   Has Patient had three or more failed trials of antipsychotic monotherapy by history:  No  Recommended Plan for Multiple Antipsychotic Therapies: NA   Allergies as of 12/31/2019   No Known Allergies     Medication List    STOP taking these medications   lamoTRIgine 25 MG tablet Commonly known as: LAMICTAL     TAKE these medications     Indication  amLODipine 5 MG tablet Commonly known as: NORVASC Take 1 tablet (5 mg total) by mouth daily.  Indication: High Blood Pressure Disorder   benztropine 2 MG tablet Commonly known as: COGENTIN Take 1 tablet (2 mg total) by mouth 2 (two) times daily.  Indication: Extrapyramidal Reaction caused by Medications   carbamazepine 100 MG chewable tablet Commonly known as: TEGRETOL 1 in am 2 at hs  Indication: Schizophrenia that does Not Respond to Usual Drug Therapy   carvedilol 25 MG tablet Commonly known as: COREG Take 1 tablet (25 mg total) by mouth 2 (two) times daily with a meal.  Indication: High Blood Pressure Disorder   haloperidol 20 MG tablet Commonly known as: HALDOL Take 1 tablet (20 mg total) by mouth at bedtime. What changed: medication strength  Indication: Psychosis   haloperidol decanoate 100 MG/ML injection Commonly known as: HALDOL DECANOATE Inject 1.5 mLs (150 mg  total) into the muscle every 30 (thirty) days. Due 3/3  Indication: Schizophrenia   temazepam 30 MG  capsule Commonly known as: RESTORIL Take 1 capsule (30 mg total) by mouth at bedtime.  Indication: Trouble Sleeping      Follow-up Information    [name] Follow up on 01/01/2020.   Why: You are scheduled for an appointment on 01/01/20 at 12:00 pm.  This will be a virtual appointment.  Please have your insurance information and your discharge summary available.   Contact information: [address] [place] [address] [phone]          SignedJohnn Hai, MD 12/31/2019, 12:19 PM

## 2020-01-06 ENCOUNTER — Encounter (HOSPITAL_COMMUNITY): Payer: Self-pay | Admitting: Emergency Medicine

## 2020-01-06 ENCOUNTER — Emergency Department (HOSPITAL_COMMUNITY)
Admission: EM | Admit: 2020-01-06 | Discharge: 2020-01-07 | Disposition: A | Discharge status: Home / Self Care | Payer: Medicare Other | Attending: Emergency Medicine | Admitting: Emergency Medicine

## 2020-01-06 ENCOUNTER — Other Ambulatory Visit: Payer: Self-pay

## 2020-01-06 DIAGNOSIS — F319 Bipolar disorder, unspecified: Secondary | ICD-10-CM | POA: Diagnosis not present

## 2020-01-06 DIAGNOSIS — F312 Bipolar disorder, current episode manic severe with psychotic features: Secondary | ICD-10-CM | POA: Diagnosis not present

## 2020-01-06 DIAGNOSIS — F209 Schizophrenia, unspecified: Secondary | ICD-10-CM | POA: Diagnosis not present

## 2020-01-06 DIAGNOSIS — F29 Unspecified psychosis not due to a substance or known physiological condition: Secondary | ICD-10-CM | POA: Diagnosis not present

## 2020-01-06 DIAGNOSIS — Z9114 Patient's other noncompliance with medication regimen: Secondary | ICD-10-CM | POA: Insufficient documentation

## 2020-01-06 DIAGNOSIS — Z20822 Contact with and (suspected) exposure to covid-19: Secondary | ICD-10-CM | POA: Diagnosis not present

## 2020-01-06 DIAGNOSIS — Z79899 Other long term (current) drug therapy: Secondary | ICD-10-CM | POA: Insufficient documentation

## 2020-01-06 DIAGNOSIS — R462 Strange and inexplicable behavior: Secondary | ICD-10-CM | POA: Diagnosis present

## 2020-01-06 LAB — CBC WITH DIFFERENTIAL/PLATELET
Abs Immature Granulocytes: 0.01 10*3/uL (ref 0.00–0.07)
Basophils Absolute: 0 10*3/uL (ref 0.0–0.1)
Basophils Relative: 1 %
Eosinophils Absolute: 0 10*3/uL (ref 0.0–0.5)
Eosinophils Relative: 1 %
HCT: 40 % (ref 36.0–46.0)
Hemoglobin: 13.4 g/dL (ref 12.0–15.0)
Immature Granulocytes: 0 %
Lymphocytes Relative: 26 %
Lymphs Abs: 2 10*3/uL (ref 0.7–4.0)
MCH: 28.3 pg (ref 26.0–34.0)
MCHC: 33.5 g/dL (ref 30.0–36.0)
MCV: 84.6 fL (ref 80.0–100.0)
Monocytes Absolute: 0.8 10*3/uL (ref 0.1–1.0)
Monocytes Relative: 10 %
Neutro Abs: 4.8 10*3/uL (ref 1.7–7.7)
Neutrophils Relative %: 62 %
Platelets: 209 10*3/uL (ref 150–400)
RBC: 4.73 MIL/uL (ref 3.87–5.11)
RDW: 12.4 % (ref 11.5–15.5)
WBC: 7.6 10*3/uL (ref 4.0–10.5)
nRBC: 0 % (ref 0.0–0.2)

## 2020-01-06 LAB — COMPREHENSIVE METABOLIC PANEL
ALT: 17 U/L (ref 0–44)
AST: 17 U/L (ref 15–41)
Albumin: 4.3 g/dL (ref 3.5–5.0)
Alkaline Phosphatase: 62 U/L (ref 38–126)
Anion gap: 13 (ref 5–15)
BUN: 23 mg/dL — ABNORMAL HIGH (ref 6–20)
CO2: 22 mmol/L (ref 22–32)
Calcium: 10 mg/dL (ref 8.9–10.3)
Chloride: 101 mmol/L (ref 98–111)
Creatinine, Ser: 0.88 mg/dL (ref 0.44–1.00)
GFR calc Af Amer: >60 mL/min (ref >60)
GFR calc non Af Amer: >60 mL/min (ref >60)
Glucose, Bld: 176 mg/dL — ABNORMAL HIGH (ref 70–99)
Potassium: 3.4 mmol/L — ABNORMAL LOW (ref 3.5–5.1)
Sodium: 136 mmol/L (ref 135–145)
Total Bilirubin: 0.5 mg/dL (ref 0.3–1.2)
Total Protein: 7.3 g/dL (ref 6.5–8.1)

## 2020-01-06 LAB — RESPIRATORY PANEL BY RT PCR (FLU A&B, COVID)
Influenza A by PCR: NEGATIVE
Influenza B by PCR: NEGATIVE
SARS Coronavirus 2 by RT PCR: NEGATIVE

## 2020-01-06 LAB — CARBAMAZEPINE LEVEL, TOTAL: Carbamazepine Lvl: <2 ug/mL — ABNORMAL LOW (ref 4.0–12.0)

## 2020-01-06 LAB — ETHANOL: Alcohol, Ethyl (B): <10 mg/dL (ref <10)

## 2020-01-06 MED ORDER — AMLODIPINE BESYLATE 5 MG PO TABS
5.0000 mg | ORAL_TABLET | Freq: Every day | ORAL | Status: DC
  Administered 2020-01-06 – 2020-01-07 (×2): 5 mg via ORAL
  Filled 2020-01-06 (×2): qty 1

## 2020-01-06 MED ORDER — TEMAZEPAM 15 MG PO CAPS
30.0000 mg | ORAL_CAPSULE | Freq: Every day | ORAL | Status: DC
  Administered 2020-01-06: 21:00:00 30 mg via ORAL
  Filled 2020-01-06: qty 2

## 2020-01-06 MED ORDER — CARVEDILOL 25 MG PO TABS
25.0000 mg | ORAL_TABLET | Freq: Two times a day (BID) | ORAL | Status: DC
  Administered 2020-01-06 – 2020-01-07 (×2): 25 mg via ORAL
  Filled 2020-01-06 (×3): qty 1

## 2020-01-06 MED ORDER — CARBAMAZEPINE 100 MG PO CHEW
100.0000 mg | CHEWABLE_TABLET | Freq: Every day | ORAL | Status: DC
  Administered 2020-01-07: 100 mg via ORAL
  Filled 2020-01-06: qty 1

## 2020-01-06 MED ORDER — HALOPERIDOL 5 MG PO TABS
20.0000 mg | ORAL_TABLET | Freq: Every day | ORAL | Status: DC
  Administered 2020-01-06: 21:00:00 20 mg via ORAL
  Filled 2020-01-06: qty 4

## 2020-01-06 MED ORDER — BENZTROPINE MESYLATE 1 MG PO TABS
2.0000 mg | ORAL_TABLET | Freq: Two times a day (BID) | ORAL | Status: DC
  Administered 2020-01-06 – 2020-01-07 (×2): 2 mg via ORAL
  Filled 2020-01-06 (×2): qty 2

## 2020-01-06 MED ORDER — CARBAMAZEPINE 100 MG PO CHEW
200.0000 mg | CHEWABLE_TABLET | Freq: Every day | ORAL | Status: DC
  Administered 2020-01-06: 22:00:00 200 mg via ORAL
  Filled 2020-01-06: qty 2

## 2020-01-06 NOTE — ED Notes (Signed)
Patient is cooperative with this Probation officer. Patient appears shaky and frightened. Patient stated "I'm afraid" . Patient is unable to verbalize what's causing the fear. Patient stated "I can't tell you" when asked to verbalize what's making her afraid. Patient has sad affect. Patient denies SI and HI. Patient denies AH and VH.   This writer attempted to take patient to bathroom. Patient was afraid when in bathroom. Patient began to pace back and forth. Patient appeared to lose balance , this writer supported patient to remain upright.

## 2020-01-06 NOTE — ED Notes (Signed)
Patient belongings collected and given to receiving RN. Security wanded Pt and PT was transported.
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
## 2020-01-06 NOTE — ED Notes (Signed)
TTS cart at bedside. 

## 2020-01-06 NOTE — ED Triage Notes (Signed)
Patient states she has not been taking her schizophrenia/bipolar medication for the past few days, she is unable to tell me why she is not taking her medication. Denies SI and HI.
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
## 2020-01-06 NOTE — ED Notes (Signed)
Patient was given meal tray.

## 2020-01-06 NOTE — [facility] Counselor (Signed)
Clinician called TTS cart within 10 minutes. Clinician to check back.    Vertell Novak, [place], [facility], [facility] Triage Specialist [phone]

## 2020-01-07 DIAGNOSIS — F312 Bipolar disorder, current episode manic severe with psychotic features: Secondary | ICD-10-CM | POA: Diagnosis not present

## 2020-01-07 LAB — URINALYSIS, ROUTINE W REFLEX MICROSCOPIC
Bilirubin Urine: NEGATIVE
Glucose, UA: NEGATIVE mg/dL
Hgb urine dipstick: NEGATIVE
Ketones, ur: NEGATIVE mg/dL
Leukocytes,Ua: NEGATIVE
Nitrite: NEGATIVE
Protein, ur: NEGATIVE mg/dL
Specific Gravity, Urine: 1.004 — ABNORMAL LOW (ref 1.005–1.030)
pH: 6 (ref 5.0–8.0)

## 2020-01-07 LAB — RAPID URINE DRUG SCREEN, HOSP PERFORMED
Amphetamines: NOT DETECTED
Barbiturates: NOT DETECTED
Benzodiazepines: POSITIVE — AB
Cocaine: NOT DETECTED
Opiates: NOT DETECTED
Tetrahydrocannabinol: NOT DETECTED

## 2020-01-07 NOTE — ED Provider Notes (Signed)
Patient was cleared by psychiatry.  When asking patient if she wanted to go home she said she did not want to go home by herself as she lives in a home alone.  Ask what she would do if she went home and she said she would just sit there but she does not like being alone.  She denied any suicidal ideation at this time.  Labs are otherwise unremarkable and vital signs are normal.  Since patient has been psychiatrically cleared no longer reason to hold her in the emergency room.   Paula Dessert, MD 01/07/20 [phone]

## 2020-01-07 NOTE — ED Notes (Signed)
Notified patient's sister Alyssabeth Bezy of patients discharge. She will be here to pick up patient at 11:00 am. Discharge instructions reviewed with patient utilizing teach back method.

## 2020-02-07 ENCOUNTER — Inpatient Hospital Stay (HOSPITAL_COMMUNITY)
Admission: AD | Admit: 2020-02-07 | Discharge: 2020-02-12 | DRG: 885 | Disposition: A | Discharge status: Home / Self Care | Payer: Medicare Other | Source: Intra-hospital | Attending: Psychiatry | Admitting: Psychiatry

## 2020-02-07 ENCOUNTER — Emergency Department (HOSPITAL_COMMUNITY)
Admission: EM | Admit: 2020-02-07 | Discharge: 2020-02-07 | Disposition: A | Discharge status: Other Acute Inpatient Hospital | Payer: Medicare Other | Attending: Emergency Medicine | Admitting: Emergency Medicine

## 2020-02-07 ENCOUNTER — Other Ambulatory Visit: Payer: Self-pay

## 2020-02-07 DIAGNOSIS — I10 Essential (primary) hypertension: Secondary | ICD-10-CM | POA: Diagnosis not present

## 2020-02-07 DIAGNOSIS — Z20822 Contact with and (suspected) exposure to covid-19: Secondary | ICD-10-CM | POA: Diagnosis not present

## 2020-02-07 DIAGNOSIS — F312 Bipolar disorder, current episode manic severe with psychotic features: Secondary | ICD-10-CM | POA: Diagnosis present

## 2020-02-07 DIAGNOSIS — M1990 Unspecified osteoarthritis, unspecified site: Secondary | ICD-10-CM | POA: Diagnosis present

## 2020-02-07 DIAGNOSIS — Z818 Family history of other mental and behavioral disorders: Secondary | ICD-10-CM

## 2020-02-07 DIAGNOSIS — Z79899 Other long term (current) drug therapy: Secondary | ICD-10-CM

## 2020-02-07 DIAGNOSIS — F5105 Insomnia due to other mental disorder: Secondary | ICD-10-CM | POA: Diagnosis present

## 2020-02-07 DIAGNOSIS — F329 Major depressive disorder, single episode, unspecified: Secondary | ICD-10-CM | POA: Diagnosis present

## 2020-02-07 DIAGNOSIS — Z008 Encounter for other general examination: Secondary | ICD-10-CM

## 2020-02-07 DIAGNOSIS — Z8261 Family history of arthritis: Secondary | ICD-10-CM

## 2020-02-07 DIAGNOSIS — Z9114 Patient's other noncompliance with medication regimen: Secondary | ICD-10-CM

## 2020-02-07 DIAGNOSIS — Z811 Family history of alcohol abuse and dependence: Secondary | ICD-10-CM

## 2020-02-07 DIAGNOSIS — T421X5A Adverse effect of iminostilbenes, initial encounter: Secondary | ICD-10-CM | POA: Diagnosis present

## 2020-02-07 DIAGNOSIS — Z915 Personal history of self-harm: Secondary | ICD-10-CM

## 2020-02-07 DIAGNOSIS — F259 Schizoaffective disorder, unspecified: Secondary | ICD-10-CM | POA: Diagnosis present

## 2020-02-07 DIAGNOSIS — Z8249 Family history of ischemic heart disease and other diseases of the circulatory system: Secondary | ICD-10-CM

## 2020-02-07 DIAGNOSIS — Y929 Unspecified place or not applicable: Secondary | ICD-10-CM

## 2020-02-07 DIAGNOSIS — T434X5A Adverse effect of butyrophenone and thiothixene neuroleptics, initial encounter: Secondary | ICD-10-CM | POA: Diagnosis present

## 2020-02-07 DIAGNOSIS — F250 Schizoaffective disorder, bipolar type: Secondary | ICD-10-CM | POA: Diagnosis present

## 2020-02-07 DIAGNOSIS — E0590 Thyrotoxicosis, unspecified without thyrotoxic crisis or storm: Secondary | ICD-10-CM | POA: Diagnosis present

## 2020-02-07 LAB — CBC WITH DIFFERENTIAL/PLATELET
Abs Immature Granulocytes: 0.01 10*3/uL (ref 0.00–0.07)
Basophils Absolute: 0 10*3/uL (ref 0.0–0.1)
Basophils Relative: 1 %
Eosinophils Absolute: 0.1 10*3/uL (ref 0.0–0.5)
Eosinophils Relative: 2 %
HCT: 39.2 % (ref 36.0–46.0)
Hemoglobin: 12.5 g/dL (ref 12.0–15.0)
Immature Granulocytes: 0 %
Lymphocytes Relative: 34 %
Lymphs Abs: 2.1 10*3/uL (ref 0.7–4.0)
MCH: 27.2 pg (ref 26.0–34.0)
MCHC: 31.9 g/dL (ref 30.0–36.0)
MCV: 85.2 fL (ref 80.0–100.0)
Monocytes Absolute: 0.6 10*3/uL (ref 0.1–1.0)
Monocytes Relative: 10 %
Neutro Abs: 3.2 10*3/uL (ref 1.7–7.7)
Neutrophils Relative %: 53 %
Platelets: 191 10*3/uL (ref 150–400)
RBC: 4.6 MIL/uL (ref 3.87–5.11)
RDW: 11.9 % (ref 11.5–15.5)
WBC: 6 10*3/uL (ref 4.0–10.5)
nRBC: 0 % (ref 0.0–0.2)

## 2020-02-07 LAB — RAPID URINE DRUG SCREEN, HOSP PERFORMED
Amphetamines: NOT DETECTED
Barbiturates: NOT DETECTED
Benzodiazepines: NOT DETECTED
Cocaine: NOT DETECTED
Opiates: NOT DETECTED
Tetrahydrocannabinol: NOT DETECTED

## 2020-02-07 LAB — COMPREHENSIVE METABOLIC PANEL
ALT: 12 U/L (ref 0–44)
AST: 12 U/L — ABNORMAL LOW (ref 15–41)
Albumin: 4 g/dL (ref 3.5–5.0)
Alkaline Phosphatase: 51 U/L (ref 38–126)
Anion gap: 11 (ref 5–15)
BUN: 10 mg/dL (ref 6–20)
CO2: 26 mmol/L (ref 22–32)
Calcium: 10.1 mg/dL (ref 8.9–10.3)
Chloride: 106 mmol/L (ref 98–111)
Creatinine, Ser: 0.81 mg/dL (ref 0.44–1.00)
GFR calc Af Amer: >60 mL/min (ref >60)
GFR calc non Af Amer: >60 mL/min (ref >60)
Glucose, Bld: 101 mg/dL — ABNORMAL HIGH (ref 70–99)
Potassium: 3.1 mmol/L — ABNORMAL LOW (ref 3.5–5.1)
Sodium: 143 mmol/L (ref 135–145)
Total Bilirubin: 0.8 mg/dL (ref 0.3–1.2)
Total Protein: 6.6 g/dL (ref 6.5–8.1)

## 2020-02-07 LAB — URINALYSIS, ROUTINE W REFLEX MICROSCOPIC
Bacteria, UA: NONE SEEN
Bilirubin Urine: NEGATIVE
Glucose, UA: NEGATIVE mg/dL
Hgb urine dipstick: NEGATIVE
Ketones, ur: NEGATIVE mg/dL
Leukocytes,Ua: NEGATIVE
Nitrite: NEGATIVE
Protein, ur: NEGATIVE mg/dL
Specific Gravity, Urine: 1.004 — ABNORMAL LOW (ref 1.005–1.030)
pH: 7 (ref 5.0–8.0)

## 2020-02-07 LAB — RESPIRATORY PANEL BY RT PCR (FLU A&B, COVID)
Influenza A by PCR: NEGATIVE
Influenza B by PCR: NEGATIVE
SARS Coronavirus 2 by RT PCR: NEGATIVE

## 2020-02-07 LAB — ACETAMINOPHEN LEVEL: Acetaminophen (Tylenol), Serum: <10 ug/mL — ABNORMAL LOW (ref 10–30)

## 2020-02-07 LAB — I-STAT BETA HCG BLOOD, ED (MC, WL, AP ONLY): I-stat hCG, quantitative: <5 m[IU]/mL (ref <5)

## 2020-02-07 LAB — ETHANOL: Alcohol, Ethyl (B): <10 mg/dL (ref <10)

## 2020-02-07 LAB — SALICYLATE LEVEL: Salicylate Lvl: <7 mg/dL — ABNORMAL LOW (ref 7.0–30.0)

## 2020-02-07 MED ORDER — AMLODIPINE BESYLATE 5 MG PO TABS
5.0000 mg | ORAL_TABLET | Freq: Every day | ORAL | Status: DC
  Administered 2020-02-07: 5 mg via ORAL
  Filled 2020-02-07: qty 1

## 2020-02-07 MED ORDER — BENZTROPINE MESYLATE 1 MG PO TABS
2.0000 mg | ORAL_TABLET | Freq: Two times a day (BID) | ORAL | Status: DC
  Administered 2020-02-07: 2 mg via ORAL
  Filled 2020-02-07: qty 2

## 2020-02-07 MED ORDER — TEMAZEPAM 15 MG PO CAPS
30.0000 mg | ORAL_CAPSULE | Freq: Every day | ORAL | Status: DC
  Administered 2020-02-07: 30 mg via ORAL
  Filled 2020-02-07: qty 2

## 2020-02-07 MED ORDER — CARBAMAZEPINE 100 MG PO CHEW: 100.0000 mg | CHEWABLE_TABLET | Freq: Every day | ORAL | Status: DC

## 2020-02-07 MED ORDER — HALOPERIDOL 5 MG PO TABS
20.0000 mg | ORAL_TABLET | Freq: Every day | ORAL | Status: DC
  Administered 2020-02-07: 20 mg via ORAL
  Filled 2020-02-07: qty 4

## 2020-02-07 MED ORDER — POTASSIUM CHLORIDE CRYS ER 20 MEQ PO TBCR
40.0000 meq | EXTENDED_RELEASE_TABLET | Freq: Once | ORAL | Status: AC
  Administered 2020-02-07: 40 meq via ORAL
  Filled 2020-02-07: qty 2

## 2020-02-07 MED ORDER — CARVEDILOL 25 MG PO TABS
25.0000 mg | ORAL_TABLET | Freq: Two times a day (BID) | ORAL | Status: DC
  Administered 2020-02-07: 25 mg via ORAL
  Filled 2020-02-07: qty 1

## 2020-02-07 MED ORDER — CARBAMAZEPINE 100 MG PO CHEW
200.0000 mg | CHEWABLE_TABLET | Freq: Every day | ORAL | Status: DC
  Administered 2020-02-07: 200 mg via ORAL
  Filled 2020-02-07: qty 2

## 2020-02-07 NOTE — ED Triage Notes (Addendum)
Patient states she has been lying around a lot lately and wants to make sure she is okay. Patient states she doesn't have motivation and wants to get her house in order. When asked patient what she means by "making sure she is okay" , patient states she wants to make sure she doesn't have COVID. Patient says she thinks she had covid one time. Conversation very hard to follow in triage. Thought processed incomplete. Patient states she is not working right now and hopes to work in the near future. Patient denies SI/HI. Patient stated she has been off her blood pressure medicine but wants to make sure she is doing everything right. Patient says her sister dropped her off here today with no plan in place to pick her up.

## 2020-02-07 NOTE — ED Notes (Signed)
Patient ambulated to bathroom independently at this time.
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
## 2020-02-07 NOTE — ED Notes (Signed)
TTS at bedside via computer.

## 2020-02-07 NOTE — Progress Notes (Signed)
Pt accepted to the service of MD Dwyane Dee in the OBS unit at [facility].  Report may be called to [phone]

## 2020-02-08 ENCOUNTER — Other Ambulatory Visit: Payer: Self-pay

## 2020-02-08 ENCOUNTER — Encounter (HOSPITAL_COMMUNITY): Payer: Self-pay | Admitting: Nurse Practitioner

## 2020-02-08 DIAGNOSIS — F250 Schizoaffective disorder, bipolar type: Secondary | ICD-10-CM | POA: Diagnosis present

## 2020-02-08 LAB — LIPID PANEL
Cholesterol: 140 mg/dL (ref 0–200)
HDL: 39 mg/dL — ABNORMAL LOW (ref >40)
LDL Cholesterol: 76 mg/dL (ref 0–99)
Total CHOL/HDL Ratio: 3.6 RATIO
Triglycerides: 124 mg/dL (ref <150)
VLDL: 25 mg/dL (ref 0–40)

## 2020-02-08 LAB — CARBAMAZEPINE LEVEL, TOTAL: Carbamazepine Lvl: 4.3 ug/mL (ref 4.0–12.0)

## 2020-02-08 LAB — TSH: TSH: <0.01 u[IU]/mL — ABNORMAL LOW (ref 0.350–4.500)

## 2020-02-08 LAB — HEMOGLOBIN A1C
Hgb A1c MFr Bld: 5.5 % (ref 4.8–5.6)
Mean Plasma Glucose: 111.15 mg/dL

## 2020-02-08 MED ORDER — CARVEDILOL 25 MG PO TABS
25.0000 mg | ORAL_TABLET | Freq: Two times a day (BID) | ORAL | Status: DC
  Administered 2020-02-08 – 2020-02-12 (×10): 25 mg via ORAL
  Filled 2020-02-08 (×8): qty 1
  Filled 2020-02-08: qty 2
  Filled 2020-02-08 (×2): qty 1
  Filled 2020-02-08: qty 2
  Filled 2020-02-08: qty 1
  Filled 2020-02-08: qty 2

## 2020-02-08 MED ORDER — BENZTROPINE MESYLATE 2 MG PO TABS
2.0000 mg | ORAL_TABLET | Freq: Two times a day (BID) | ORAL | Status: DC
  Administered 2020-02-08 – 2020-02-10 (×5): 2 mg via ORAL
  Filled 2020-02-08 (×2): qty 2
  Filled 2020-02-08 (×5): qty 1
  Filled 2020-02-08: qty 2
  Filled 2020-02-08: qty 1
  Filled 2020-02-08 (×2): qty 2
  Filled 2020-02-08: qty 1

## 2020-02-08 MED ORDER — HYDROXYZINE HCL 25 MG PO TABS
25.0000 mg | ORAL_TABLET | Freq: Three times a day (TID) | ORAL | Status: DC | PRN
  Administered 2020-02-09: 25 mg via ORAL
  Filled 2020-02-08 (×3): qty 1

## 2020-02-08 MED ORDER — ALUM & MAG HYDROXIDE-SIMETH 200-200-20 MG/5ML PO SUSP: 30.0000 mL | ORAL | Status: DC | PRN

## 2020-02-08 MED ORDER — CARBAMAZEPINE 100 MG PO CHEW
200.0000 mg | CHEWABLE_TABLET | Freq: Every day | ORAL | Status: DC
  Administered 2020-02-08 – 2020-02-11 (×4): 200 mg via ORAL
  Filled 2020-02-08 (×8): qty 2

## 2020-02-08 MED ORDER — ACETAMINOPHEN 325 MG PO TABS: 650.0000 mg | ORAL_TABLET | Freq: Four times a day (QID) | ORAL | Status: DC | PRN

## 2020-02-08 MED ORDER — AMLODIPINE BESYLATE 5 MG PO TABS
5.0000 mg | ORAL_TABLET | Freq: Every day | ORAL | Status: DC
  Administered 2020-02-08 – 2020-02-12 (×5): 5 mg via ORAL
  Filled 2020-02-08 (×8): qty 1

## 2020-02-08 MED ORDER — HALOPERIDOL LACTATE 5 MG/ML IJ SOLN: 5.0000 mg | Freq: Four times a day (QID) | INTRAMUSCULAR | Status: DC | PRN

## 2020-02-08 MED ORDER — MAGNESIUM HYDROXIDE 400 MG/5ML PO SUSP: 30.0000 mL | Freq: Every day | ORAL | Status: DC | PRN

## 2020-02-08 MED ORDER — HALOPERIDOL 5 MG PO TABS
20.0000 mg | ORAL_TABLET | Freq: Every day | ORAL | Status: DC
  Administered 2020-02-08 – 2020-02-09 (×2): 20 mg via ORAL
  Filled 2020-02-08 (×6): qty 4

## 2020-02-08 MED ORDER — TEMAZEPAM 30 MG PO CAPS
30.0000 mg | ORAL_CAPSULE | Freq: Every day | ORAL | Status: DC
  Administered 2020-02-08 – 2020-02-11 (×4): 30 mg via ORAL
  Filled 2020-02-08: qty 1
  Filled 2020-02-08: qty 2
  Filled 2020-02-08 (×2): qty 1

## 2020-02-08 MED ORDER — CARBAMAZEPINE 100 MG PO CHEW
100.0000 mg | CHEWABLE_TABLET | Freq: Every day | ORAL | Status: DC
  Administered 2020-02-08 – 2020-02-12 (×5): 100 mg via ORAL
  Filled 2020-02-08 (×8): qty 1

## 2020-02-08 MED ORDER — HALOPERIDOL 5 MG PO TABS: 5.0000 mg | ORAL_TABLET | Freq: Four times a day (QID) | ORAL | Status: DC | PRN

## 2020-02-08 NOTE — Plan of Care (Signed)
[facility] Observation Crisis Plan  Reason for Crisis Plan:  Crisis Stabilization   Plan of Care:  Referral for Inpatient Hospitalization  Family Support:      Current Living Environment:     Insurance:   Hospital Account    Name Acct ID Class Status Primary Coverage   [place], Earle Sierzega [id number] [place] Open MEDICARE - MEDICARE PART A AND B        Guarantor Account (for Hospital Account [id number])    Name Relation to Pt Service Area Active? Acct Type   Moseley, Horald Chestnut Self [facility] Yes Behavioral Health   Address Phone       [address] [place], [place] [address] [phone])          Coverage Information (for Hospital Account [id number])    1. [place] PART A AND B    F/O Payor/Plan Precert #   MEDICARE/MEDICARE PART A AND B    Subscriber Subscriber #   [place] [id number]   Address Phone   [address] [place],  [address]        2. [place]    F/[place] Payor/Plan Precert #   [place]    Subscriber Subscriber #   [place], Sheenna Barnick [id number]   Address Phone   [address] [place], [place] [address] [phone]          Legal Guardian:     Primary Care Provider:  Sandi Mariscal, MD  Current Outpatient Providers:  none  Psychiatrist:     Counselor/Therapist:     Compliant with Medications:  No  Additional Information:   Jesusita Oka 3/14/20213:32 AM

## 2020-02-08 NOTE — Progress Notes (Signed)
[place] NOVEL CORONAVIRUS (COVID-19) DAILY CHECK-OFF SYMPTOMS - answer yes or no to each - every day NO YES  Have you had a fever in the past 24 hours?  . Fever (Temp > 37.80C / 100F) X   Have you had any of these symptoms in the past 24 hours? . New Cough .  Sore Throat  .  Shortness of Breath .  Difficulty Breathing .  Unexplained Body Aches   X   Have you had any one of these symptoms in the past 24 hours not related to allergies?   . Runny Nose .  Nasal Congestion .  Sneezing   X   If you have had runny nose, nasal congestion, sneezing in the past 24 hours, has it worsened?  X   EXPOSURES - check yes or no X   Have you traveled outside the state in the past 14 days?  X   Have you been in contact with someone with a confirmed diagnosis of COVID-19 or PUI in the past 14 days without wearing appropriate PPE?  X   Have you been living in the same home as a person with confirmed diagnosis of COVID-19 or a PUI (household contact)?    X   Have you been diagnosed with COVID-19?    X              What to do next: Answered NO to all: Answered YES to anything:   Proceed with unit schedule Follow the [facility] Inpatient Flowsheet.   Montrice Gracey K. Jaquane Boughner MSN, RN, [facility] [phone] 
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
## 2020-02-08 NOTE — Progress Notes (Signed)
  Pt is black female  of 54 y.o. , DOB [birth year], MRN  [id number]  presents Voluntary with a SI without method,  Hx of schizoaffective disorder, bipolar.    Patient   reports poor appetite, energy normal, and fair sleeping pattern . Pt denies  depression , hopelessness  and anxiety today. Pt denies SI,  HI or AVH.  Pt reports LBM very small  02/07/20, No pain reported.Vitals signs WNL and being monitored.   Medication given as Rx, no adverse reaction observed, safety maintained with q15 minute checks.  Lolly Mustache. Bobby Rumpf MSN, [place], [facility] [phone]

## 2020-02-09 DIAGNOSIS — Z915 Personal history of self-harm: Secondary | ICD-10-CM | POA: Diagnosis not present

## 2020-02-09 DIAGNOSIS — Z811 Family history of alcohol abuse and dependence: Secondary | ICD-10-CM | POA: Diagnosis not present

## 2020-02-09 DIAGNOSIS — T421X5A Adverse effect of iminostilbenes, initial encounter: Secondary | ICD-10-CM | POA: Diagnosis present

## 2020-02-09 DIAGNOSIS — Z8261 Family history of arthritis: Secondary | ICD-10-CM | POA: Diagnosis not present

## 2020-02-09 DIAGNOSIS — T434X5A Adverse effect of butyrophenone and thiothixene neuroleptics, initial encounter: Secondary | ICD-10-CM | POA: Diagnosis present

## 2020-02-09 DIAGNOSIS — E0590 Thyrotoxicosis, unspecified without thyrotoxic crisis or storm: Secondary | ICD-10-CM | POA: Diagnosis present

## 2020-02-09 DIAGNOSIS — M1990 Unspecified osteoarthritis, unspecified site: Secondary | ICD-10-CM | POA: Diagnosis not present

## 2020-02-09 DIAGNOSIS — Z818 Family history of other mental and behavioral disorders: Secondary | ICD-10-CM | POA: Diagnosis not present

## 2020-02-09 DIAGNOSIS — Z79899 Other long term (current) drug therapy: Secondary | ICD-10-CM | POA: Diagnosis not present

## 2020-02-09 DIAGNOSIS — F329 Major depressive disorder, single episode, unspecified: Secondary | ICD-10-CM | POA: Diagnosis not present

## 2020-02-09 DIAGNOSIS — Y929 Unspecified place or not applicable: Secondary | ICD-10-CM | POA: Diagnosis not present

## 2020-02-09 DIAGNOSIS — I10 Essential (primary) hypertension: Secondary | ICD-10-CM | POA: Diagnosis not present

## 2020-02-09 DIAGNOSIS — Z9114 Patient's other noncompliance with medication regimen: Secondary | ICD-10-CM | POA: Diagnosis not present

## 2020-02-09 DIAGNOSIS — F5105 Insomnia due to other mental disorder: Secondary | ICD-10-CM | POA: Diagnosis not present

## 2020-02-09 DIAGNOSIS — Z20822 Contact with and (suspected) exposure to covid-19: Secondary | ICD-10-CM | POA: Diagnosis not present

## 2020-02-09 DIAGNOSIS — F250 Schizoaffective disorder, bipolar type: Secondary | ICD-10-CM | POA: Diagnosis not present

## 2020-02-09 DIAGNOSIS — Z8249 Family history of ischemic heart disease and other diseases of the circulatory system: Secondary | ICD-10-CM | POA: Diagnosis not present

## 2020-02-09 NOTE — Progress Notes (Signed)
   02/09/20 2000  Psych Admission Type (Psych Patients Only)  Admission Status Voluntary  Psychosocial Assessment  Patient Complaints Anxiety;Sadness  Eye Contact Fair  Facial Expression Anxious  Affect Appropriate to circumstance  Speech Logical/coherent  Interaction Assertive  Motor Activity Slow  Appearance/Hygiene Unremarkable;In hospital gown  Behavior Characteristics Cooperative  Mood Pleasant;Preoccupied  Aggressive Behavior  Effect No apparent injury  Thought Process  Coherency Circumstantial  Content Preoccupation  Delusions WDL  Perception WDL  Hallucination None reported or observed  Judgment Impaired  Confusion Mild  Danger to Self  Current suicidal ideation? Denies  Danger to Others  Danger to Others None reported or observed   Pt calm and cooperative, slightly confused about date.

## 2020-02-09 NOTE — Tx Team (Signed)
Initial Treatment Plan 02/09/2020 4:53 PM Paula Dickson [id number]    PATIENT STRESSORS: Medication change or noncompliance   PATIENT STRENGTHS: Motivation for treatment/growth Religious Affiliation Supportive family/friends   PATIENT IDENTIFIED PROBLEMS: Alteration in thought process (confusion & hallucinations) "I stopped taking my medicines because I don't know which one to take and when to take it; I get confused easily and start hearing voices".    Risk for self harm related to medication noncompliance, disorganized thoughts.                 DISCHARGE CRITERIA:  Improved stabilization in mood, thinking, and/or behavior Verbal commitment to aftercare and medication compliance  PRELIMINARY DISCHARGE PLAN: Outpatient therapy Return to previous living arrangement  PATIENT/FAMILY INVOLVEMENT: This treatment plan has been presented to and reviewed with the patient, [name].  The patient have been given the opportunity to ask questions and make suggestions.  Keane Police, RN 02/09/2020, 4:53 PM

## 2020-02-09 NOTE — Progress Notes (Signed)
[facility] MD Progress Note  02/09/2020 11:19 AM Paula Dickson  MRN:  [id number] Subjective:   Seen in the observation ward she does not give me much of a valid history she cannot recall she had her shop this month for not so were trying to gather more data were going to monitor her another 24 hours here while we sort this out but she denies current auditory or visual hallucinations denies thoughts of harming self or others just very vague and not giving me a lot of reassurance regarding discharge and discharge planning  Principal Problem: Schizoaffective disorder, bipolar type ([place]) Diagnosis: Principal Problem:   Schizoaffective disorder, bipolar type ([place])  Total Time spent with patient: 15 minutes  Past Psychiatric History: No prior admissions and encounters  Past Medical History:  Past Medical History:  Diagnosis Date  . Bipolar affective disorder ([place])   . History of arthritis   . History of chicken pox   . History of depression   . History of genital warts   . history of heart murmur   . History of high blood pressure   . History of thyroid disease   . History of UTI   . Hypertension   . Low TSH level 07/13/2017  . Schizophrenia [facility])     Past Surgical History:  Procedure Laterality Date  . ABLATION ON ENDOMETRIOSIS    . CYST REMOVAL NECK     around 11 years ago /benign  . MULTIPLE TOOTH EXTRACTIONS     Family History:  Family History  Problem Relation Age of Onset  . Arthritis Father   . Hyperlipidemia Father   . High blood pressure Father   . Diabetes Sister   . Diabetes Mother   . Diabetes Brother   . Mental illness Brother   . Alcohol abuse Paternal Uncle   . Alcohol abuse Paternal Grandfather   . Breast cancer Maternal Aunt   . Breast cancer Paternal Aunt   . High blood pressure Sister   . Mental illness Other        runs in family   Family Psychiatric  History: No new data Social History:  Social History   Substance and Sexual Activity   Alcohol Use No     Social History   Substance and Sexual Activity  Drug Use No    Social History   Socioeconomic History  . Marital status: Single    Spouse name: Not on file  . Number of children: Not on file  . Years of education: Not on file  . Highest education level: Not on file  Occupational History  . Not on file  Tobacco Use  . Smoking status: Never Smoker  . Smokeless tobacco: Never Used  Substance and Sexual Activity  . Alcohol use: No  . Drug use: No  . Sexual activity: Not Currently  Other Topics Concern  . Not on file  Social History Narrative  . Not on file   Social Determinants of Health   Financial Resource Strain:   . Difficulty of Paying Living Expenses:   Food Insecurity:   . Worried About Charity fundraiser in the Last Year:   . Arboriculturist in the Last Year:   Transportation Needs:   . Film/video editor (Medical):   Marland Kitchen Lack of Transportation (Non-Medical):   Physical Activity:   . Days of Exercise per Week:   . Minutes of Exercise per Session:   Stress:   . Feeling of Stress :  Social Connections:   . Frequency of Communication with Friends and Family:   . Frequency of Social Gatherings with Friends and Family:   . Attends Religious Services:   . Active Member of Clubs or Organizations:   . Attends [name] Meetings:   Marland Kitchen Marital Status:    Additional Social History:                         Sleep: Good  Appetite:  Fair  Current Medications: Current Facility-Administered Medications  Medication Dose Route Frequency Provider Last Rate Last Admin  . acetaminophen (TYLENOL) tablet 650 mg  650 mg Oral Q6H PRN Lindon Romp A, NP      . alum & mag hydroxide-simeth (MAALOX/MYLANTA) 200-200-20 MG/5ML suspension 30 mL  30 mL Oral Q4H PRN Lindon Romp A, NP      . amLODipine (NORVASC) tablet 5 mg  5 mg Oral Daily Lindon Romp A, NP   5 mg at 02/09/20 0924  . benztropine (COGENTIN) tablet 2 mg  2 mg Oral BID  Lindon Romp A, NP   2 mg at 02/09/20 0925  . carbamazepine (TEGRETOL) chewable tablet 100 mg  100 mg Oral Daily Lindon Romp A, NP   100 mg at 02/09/20 0925  . carbamazepine (TEGRETOL) chewable tablet 200 mg  200 mg Oral QHS Lindon Romp A, NP   200 mg at 02/08/20 2116  . carvedilol (COREG) tablet 25 mg  25 mg Oral BID Lindon Romp A, NP   25 mg at 02/09/20 [id number]  . haloperidol (HALDOL) tablet 20 mg  20 mg Oral QHS Lindon Romp A, NP   20 mg at 02/08/20 2115  . haloperidol (HALDOL) tablet 5 mg  5 mg Oral Q6H PRN Rozetta Nunnery, NP       Or  . haloperidol lactate (HALDOL) injection 5 mg  5 mg Intramuscular Q6H PRN Lindon Romp A, NP      . hydrOXYzine (ATARAX/VISTARIL) tablet 25 mg  25 mg Oral TID PRN Lindon Romp A, NP      . magnesium hydroxide (MILK OF MAGNESIA) suspension 30 mL  30 mL Oral Daily PRN Lindon Romp A, NP      . temazepam (RESTORIL) capsule 30 mg  30 mg Oral QHS Lindon Romp A, NP   30 mg at 02/08/20 2116    Lab Results:  Results for orders placed or performed during the hospital encounter of 02/07/20 (from the past 48 hour(s))  Carbamazepine level, total     Status: None   Collection Time: 02/08/20  6:57 AM  Result Value Ref Range   Carbamazepine Lvl 4.3 4.0 - 12.0 ug/mL    Comment: Performed at [facility], [address] [place], [place] [address]  Hemoglobin A1c     Status: None   Collection Time: 02/08/20  6:57 AM  Result Value Ref Range   Hgb A1c MFr Bld 5.5 4.8 - 5.6 %    Comment: (NOTE) Pre diabetes:          5.7%-6.4% Diabetes:              >6.4% Glycemic control for   <7.0% adults with diabetes    Mean Plasma Glucose 111.15 mg/dL    Comment: Performed at [facility] [address] [place], [place] [address]  Lipid panel     Status: Abnormal   Collection Time: 02/08/20  6:57 AM  Result Value Ref Range  Cholesterol 140 0 - 200 mg/dL   Triglycerides 124 <150 mg/dL   HDL 39 (L) >40 mg/dL   Total CHOL/HDL Ratio 3.6 RATIO   VLDL 25 0 - 40  mg/dL   LDL Cholesterol 76 0 - 99 mg/dL    Comment:        Total Cholesterol/HDL:CHD Risk Coronary Heart Disease Risk Table                     Men   Women  1/2 Average Risk   3.4   3.3  Average Risk       5.0   4.4  2 X Average Risk   9.6   7.1  3 X Average Risk  23.4   11.0        Use the calculated Patient Ratio above and the CHD Risk Table to determine the patient's CHD Risk.        ATP III CLASSIFICATION (LDL):  <100     mg/dL   Optimal  100-129  mg/dL   Near or Above                    Optimal  130-159  mg/dL   Borderline  160-189  mg/dL   High  >190     mg/dL   Very High Performed at [facility] [address], [place], [place] [address]   TSH     Status: Abnormal   Collection Time: 02/08/20  6:57 AM  Result Value Ref Range   TSH <0.010 (L) 0.350 - 4.500 uIU/mL    Comment: Performed by a 3rd Generation assay with a functional sensitivity of <=0.01 uIU/mL. REPEATED TO VERIFY Performed at [facility], [place] [address], [place], [place] [address]     Blood Alcohol level:  Lab Results  Component Value Date   ETH <10 02/07/2020   ETH <10 0000000    Metabolic Disorder Labs: Lab Results  Component Value Date   HGBA1C 5.5 02/08/2020   MPG 111.15 02/08/2020   MPG 114.02 03/14/2019   Lab Results  Component Value Date   PROLACTIN 11.0 03/14/2019   Lab Results  Component Value Date   CHOL 140 02/08/2020   TRIG 124 02/08/2020   HDL 39 (L) 02/08/2020   CHOLHDL 3.6 02/08/2020   VLDL 25 02/08/2020   LDLCALC 76 02/08/2020   LDLCALC 60 12/23/2019    Physical Findings: AIMS: Facial and Oral Movements Muscles of Facial Expression: None, normal Lips and Perioral Area: None, normal Jaw: None, normal Tongue: None, normal,Extremity Movements Upper (arms, wrists, hands, fingers): None, normal Lower (legs, knees, ankles, toes): None, normal, Trunk Movements Neck, shoulders, hips: None, normal, Overall Severity Severity of  abnormal movements (highest score from questions above): None, normal Incapacitation due to abnormal movements: None, normal Patient's awareness of abnormal movements (rate only patient's report): No Awareness, Dental Status Current problems with teeth and/or dentures?: No Does patient usually wear dentures?: No  CIWA:  CIWA-Ar Total: 1 COWS:  COWS Total Score: 1  Musculoskeletal: Strength & Muscle Tone: within normal limits Gait & Station: unsteady Patient leans: N/A  Psychiatric Specialty Exam: Physical Exam  Review of Systems  Blood pressure (!) 151/88, pulse 75, temperature 97.7 F (36.5 C), temperature source Oral, resp. rate 18, SpO2 100 %.There is no height or weight on file to calculate BMI.  General Appearance: Casual  Eye Contact:  Minimal  Speech:  Slow  Volume:  Decreased  Mood:  Dysphoric  Affect:  Blunt  Thought Process:  Linear  Orientation:  Other:  Person place situation not date  Thought Content:  Denies current auditory or visual hallucinations  Suicidal Thoughts:  No  Homicidal Thoughts:  No  Memory:  Immediate;   Poor Recent;   Poor  Judgement:  Impaired  Insight:  Shallow  Psychomotor Activity:  Normal and Decreased  Concentration:  Attention Span: Fair  Recall:  Poor  Fund of Knowledge:  Fair  Language:  Good  Akathisia:  Negative  Handed:  Right  AIMS (if indicated):     Assets:  Physical Health Resilience  ADL's:  Intact  Cognition:  Impaired,  Mild  Sleep:        Treatment Plan Summary: Daily contact with patient to assess and evaluate symptoms and progress in treatment and Medication management  Poverty of content of speech and thought unclear what the best course of action is organic talk with her family members -try to see if she is had her long-acting injectable this month and monitor another 24 hours with treatment  Johnn Hai, MD 02/09/2020, 11:19 AM

## 2020-02-09 NOTE — Progress Notes (Signed)
[place] NOVEL CORONAVIRUS (COVID-19) DAILY CHECK-OFF SYMPTOMS - answer yes or no to each - every day NO YES  Have you had a fever in the past 24 hours?  . Fever (Temp > 37.80C / 100F) X   Have you had any of these symptoms in the past 24 hours? . New Cough .  Sore Throat  .  Shortness of Breath .  Difficulty Breathing .  Unexplained Body Aches   X   Have you had any one of these symptoms in the past 24 hours not related to allergies?   . Runny Nose .  Nasal Congestion .  Sneezing   X   If you have had runny nose, nasal congestion, sneezing in the past 24 hours, has it worsened?  X   EXPOSURES - check yes or no X   Have you traveled outside the state in the past 14 days?  X   Have you been in contact with someone with a confirmed diagnosis of COVID-19 or PUI in the past 14 days without wearing appropriate PPE?  X   Have you been living in the same home as a person with confirmed diagnosis of COVID-19 or a PUI (household contact)?    X   Have you been diagnosed with COVID-19?    X              What to do next: Answered NO to all: Answered YES to anything:   Proceed with unit schedule Follow the [facility] Inpatient Flowsheet.   Pt observed in bed majority of this shift. Presents with tangential speech, anxious / fidgety with mild confused on interactions. Denies SI, HI, AVH and pain. Remains medication compliant. Declined breakfast and lunch on initial offer; however, proceeded to eat breakfast at 1500 "that's what I want to eat, I can have that since I just woke up". Pt tolerated all PO intake well. Remains medication compliant. Denies adverse drug reactions when assessed. Continued support and encouragement offered throughout this shift. Q 15 minutes safety checks maintained without self harm gestures.

## 2020-02-09 NOTE — BH Assessment (Signed)
[place] Assessment Progress Note  Per Johnn Hai, MD, this voluntary pt requires psychiatric hospitalization at this time.  Jasmine has assigned pt to [facility] Rm [id number].  Pt's nurse. Olivette, has been notified.  Jalene Mullet, [place] Coordinator [phone]

## 2020-02-10 LAB — T4, FREE: Free T4: 1.48 ng/dL — ABNORMAL HIGH (ref 0.61–1.12)

## 2020-02-10 MED ORDER — HALOPERIDOL 5 MG PO TABS
10.0000 mg | ORAL_TABLET | Freq: Every day | ORAL | Status: DC
  Administered 2020-02-10 – 2020-02-11 (×2): 10 mg via ORAL
  Filled 2020-02-10 (×4): qty 2

## 2020-02-10 MED ORDER — BENZTROPINE MESYLATE 1 MG PO TABS
1.0000 mg | ORAL_TABLET | Freq: Two times a day (BID) | ORAL | Status: DC
  Administered 2020-02-10 – 2020-02-12 (×5): 1 mg via ORAL
  Filled 2020-02-10 (×8): qty 1

## 2020-02-10 NOTE — Progress Notes (Signed)
Recreation Therapy Notes  Date: 3.16.21 Time: 1000 Location: 500 Hall Dayroom  Group Topic: Wellness  Goal Area(s) Addresses:  Patient will define components of whole wellness. Patient will verbalize benefit of whole wellness.  Intervention: Music  Activity:  Exercise.  LRT led group in a series of stretches to loosen up the muscles.  Each patient would lead the group in an exercise of their choice.  The group was to do at least 30 minutes of exercise.  Patients could take breaks or get water as needed.  Education: Wellness, Discharge Planning.   Education Outcome: Acknowledges education/In group clarification offered/Needs additional education.   Clinical Observations/Feedback:  Pt did not attend group session.    Martavia Tye, LRT/CTRS         Lebert Lovern A 02/10/2020 11:04 AM 

## 2020-02-10 NOTE — [facility] Suicide Risk Assessment (Signed)
[place] INPATIENT:  Family/Significant Other Suicide Prevention Education  Suicide Prevention Education:  Education Completed; Research officer, trade union, sister [phone],  has been identified by the patient as the family member/significant other with whom the patient will be residing, and identified as the person(s) who will aid the patient in the event of a mental health crisis (suicidal ideations/suicide attempt).  With written consent from the patient, the family member/significant other has been provided the following suicide prevention education, prior to the and/or following the discharge of the patient.  The suicide prevention education provided includes the following:  Suicide risk factors  Suicide prevention and interventions  National Suicide Hotline telephone number  [facility] assessment telephone number  [facility] Emergency Assistance [place] and/or Residential Mobile Crisis Unit telephone number  Request made of family/significant other to:  Remove weapons (e.g., guns, rifles, knives), all items previously/currently identified as safety concern.    Remove drugs/medications (over-the-counter, prescriptions, illicit drugs), all items previously/currently identified as a safety concern.  The family member/significant other verbalizes understanding of the suicide prevention education information provided.  The family member/significant other agrees to remove the items of safety concern listed above.  CSW spoke with the patient's sister.  Sister reports that she is not sure why the patient is in the hospital. She reports that the patient called their brother "at 6:30AM on Saturday, "she told him that she had called the sheriff, the sheriff said she was concerned about the ceramics and stuff in the home and that something would catch on fire".  She reports that patient has a history of Schizophrenia and Bipolar diagnoses.  She reports that patient "has seemed very  confused lately".  She reports no concerns for patient having access to weapons.  She reports that patient "says she is not a danger to herself or other but I just don't know". She reports concerns for patient's "off and on behavior".   Rozann Lesches 02/10/2020, 2:43 PM

## 2020-02-10 NOTE — BHH Counselor (Signed)
Adult Comprehensive Assessment  Patient ID: Paula Dickson, female   DOB: 07-04-1966, 54 y.o.   MRN: [id number] Information Source: Information source: Patient  Current Stressors: Patient states their primary concerns and needs for treatment are:: ""With the way". Pt reports that the sheriff came and got her when she was just washing her dishes.  Patient states their goals for this hospitilization and ongoing recovery are:: "to go back to school"  Educational / Learning stressors: None reported Employment / Job issues: None reported Family Relationships: Patient reports that her dad died in May 09, 2019 and ever since the family has been trying to take things from the house like land and titles of cars.  Financial / Lack of resources (include bankruptcy): None reported Housing / Lack of housing: Patient reports that she lives alone since her father passed.  Physical health (include injuries & life threatening diseases): Hypertension Social relationships: None reported Substance abuse: None reported Bereavement / Loss: Patient reports that her mother died in 03/08/2010 and her father died in 05-09-19 from prostate cancer.   Living/Environment/Situation: Living Arrangements: Alone Living conditions (as described by patient or guardian): "I live alone now that my dad is dead. I like it but people keep coming to get parts and I get scared sometimes"  Who else lives in the home?: Just the patient  How long has patient lived in current situation?: Patient has always lived at home.  What is atmosphere in current home: Comfortable   Family History: Marital status: Single Are you sexually active?: No What is your sexual orientation?: Heterosexual Has your sexual activity been affected by drugs, alcohol, medication, or emotional stress?: None reported Children: None  Childhood History: By whom was/is the patient raised?: Both parents Description of patient's relationship with  caregiver when they were a child: Good relationship with parents  Patient's description of current relationship with people who raised him/her: Patient's father and mother are now deceased.  How were you disciplined when you got in trouble as a child/adolescent?: None reported  Does patient have siblings?: Yes Number of Siblings: 7 Description of patient's current relationship with siblings: 5 brothers, 2 sisters; patient is resentful of how much siblings are coming to the home and interfering with care for her father, telling her what to do Did patient suffer any verbal/emotional/physical/sexual abuse as a child?: No Has patient ever been sexually abused/assaulted/raped as an adolescent or adult?: No Was the patient ever a victim of a crime or a disaster?: No Witnessed domestic violence?: No Has patient been effected by domestic violence as an adult?: No  Education: 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 Currently a student?: No Learning disability?: No  Employment/Work Situation: Employment situation: On disability Why is patient on disability: Mental health How long has patient been on disability: 3 years Patient's job has been impacted by current illness: No What is the longest time patient has a held a job?: 9 years  Did You Receive Any Psychiatric Treatment/Services While in the [employer]?: (No Armed forces logistics/support/administrative officer) Are There Guns or Other Weapons in [place]?: No  Financial Resources: Financial resources: Teacher, early years/pre, Medicare Does patient have a Programmer, applications or guardian?: Yes Name of representative payee or guardian: Camaria Nicklow [phone]  Alcohol/Substance Abuse: What has been your use of drugs/alcohol within the last 12 months?: Denies Alcohol/Substance Abuse Treatment Hx: Denies past history Has alcohol/substance abuse ever caused legal problems?: No  Social Support System: [employer] Support System:  [place]  Describe Community Support System: Pt reports some friends and her siblings, at times.  Type of faith/religion: Darrick Meigs How does patient's faith help to cope with current illness?: Sister has previously told staff that patient is very involved in church.  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." Patient states they can use these personal strengths during their treatment to contribute to their recovery: "Taking care of my own business." Patient states these barriers may affect/interfere with their treatment: None Patient states these barriers may affect their return to the community: N/A Other important information patient would like considered in planning for their treatment: None  Discharge Plan: Currently receiving community mental health services: Yes (From Whom)(States she gets services from both [facility]) Patient states concerns and preferences for aftercare planning are: Patient reports wanting to continue with [facility] and stated that she goes to [facility] on [address] for her hypertension Patient states they will know when they are safe and ready for discharge when: "Now"  Does patient have access to transportation?: No Does patient have financial barriers related to discharge medications?: No Patient description of barriers related to discharge medications: Has disability income and Medicare Plan for no access to transportation at discharge: Patient states that she will find a way home.  Plan for living situation after discharge: Pt will go back home  Will patient be returning to same living situation after discharge?: Yes  Summary/Recommendations:   Summary and Recommendations (to be completed by the evaluator): Paula Dickson is a 54 year old female who is diagnosed with  Schizoaffective disorder, bipolar type. She presetend  to the hospital seeking treatment for a "mental health check". During the assessment, Paula Dickson was pleasant and cooperative with providing information. Paula Dickson reports that she does not know why she came to the hospital, however she reports "my house is a mess". [place] can benefit from crisis stabilization, medication management, therapeutic milieu and referral services.  Marylee Floras. 02/10/2020

## 2020-02-10 NOTE — Progress Notes (Signed)
   02/10/20 0900  Psych Admission Type (Psych Patients Only)  Admission Status Voluntary  Psychosocial Assessment  Patient Complaints Depression  Eye Contact Fair  Facial Expression Anxious  Affect Appropriate to circumstance  Speech Logical/coherent  Interaction Assertive  Motor Activity Slow  Appearance/Hygiene Unremarkable;In hospital gown  Behavior Characteristics Cooperative  Mood Depressed  Aggressive Behavior  Effect No apparent injury  Thought Process  Coherency Circumstantial  Content Preoccupation  Delusions WDL  Perception WDL  Hallucination None reported or observed  Judgment Impaired  Confusion Mild  Danger to Self  Current suicidal ideation? Denies  Danger to Others  Danger to Others None reported or observed

## 2020-02-10 NOTE — Progress Notes (Signed)
[facility] MD Progress Note  02/10/2020 11:53 AM Paula Dickson  MRN:  [id number] Subjective:  This is the latest of multiple admissions here and elsewhere in the latest of multiple healthcare system encounters for [name] she is 46 suffers from a chronic schizoaffective/bipolar type condition complicated by compliance issues, and her symptomaic state can range from near catatonia to volatility.  She re-presented on 3/13 in a confused state, acknowledging noncompliance with the medications due to an overall confusion as to how to take them, lacking a full degree of orientation and again acknowledging confusion, memory deficits, and paranoia was also elaborated.  Since here of not gotten any meaningful answers out of her she tends to ramble and mumble her answers and they are again disjointed and somewhat nonsensical.  She knows where she is but cannot articulate clearly other than the hospital  Principal Problem: Schizoaffective disorder, bipolar type ([place]) Diagnosis: Principal Problem:   Schizoaffective disorder, bipolar type ([place]) Active Problems:   Schizoaffective disorder ([place])  Total Time spent with patient: 20 minutes  Past Psychiatric History: see eval  Past Medical History:  Past Medical History:  Diagnosis Date  . Bipolar affective disorder ([place])   . History of arthritis   . History of chicken pox   . History of depression   . History of genital warts   . history of heart murmur   . History of high blood pressure   . History of thyroid disease   . History of UTI   . Hypertension   . Low TSH level 07/13/2017  . Schizophrenia [facility])     Past Surgical History:  Procedure Laterality Date  . ABLATION ON ENDOMETRIOSIS    . CYST REMOVAL NECK     around 11 years ago /benign  . MULTIPLE TOOTH EXTRACTIONS     Family History:  Family History  Problem Relation Age of Onset  . Arthritis Father   . Hyperlipidemia Father   . High blood pressure Father   . Diabetes Sister    . Diabetes Mother   . Diabetes Brother   . Mental illness Brother   . Alcohol abuse Paternal Uncle   . Alcohol abuse Paternal Grandfather   . Breast cancer Maternal Aunt   . Breast cancer Paternal Aunt   . High blood pressure Sister   . Mental illness Other        runs in family   Family Psychiatric  History: see eval Social History:  Social History   Substance and Sexual Activity  Alcohol Use No     Social History   Substance and Sexual Activity  Drug Use No    Social History   Socioeconomic History  . Marital status: Single    Spouse name: Not on file  . Number of children: Not on file  . Years of education: Not on file  . Highest education level: Not on file  Occupational History  . Not on file  Tobacco Use  . Smoking status: Never Smoker  . Smokeless tobacco: Never Used  Substance and Sexual Activity  . Alcohol use: No  . Drug use: No  . Sexual activity: Not Currently  Other Topics Concern  . Not on file  Social History Narrative  . Not on file   Social Determinants of Health   Financial Resource Strain:   . Difficulty of Paying Living Expenses:   Food Insecurity:   . Worried About Charity fundraiser in the Last Year:   . Ran  Out of Food in the Last Year:   Transportation Needs:   . Lack of Transportation (Medical):   Marland Kitchen Lack of Transportation (Non-Medical):   Physical Activity:   . Days of Exercise per Week:   . Minutes of Exercise per Session:   Stress:   . Feeling of Stress :   Social Connections:   . Frequency of Communication with Friends and Family:   . Frequency of Social Gatherings with Friends and Family:   . Attends Religious Services:   . Active Member of Clubs or Organizations:   . Attends [name] Meetings:   Marland Kitchen Marital Status:    Additional Social History:                         Sleep: Fair  Appetite:  Fair  Current Medications: Current Facility-Administered Medications  Medication Dose Route  Frequency Provider Last Rate Last Admin  . acetaminophen (TYLENOL) tablet 650 mg  650 mg Oral Q6H PRN Lindon Romp A, NP      . alum & mag hydroxide-simeth (MAALOX/MYLANTA) 200-200-20 MG/5ML suspension 30 mL  30 mL Oral Q4H PRN Lindon Romp A, NP      . amLODipine (NORVASC) tablet 5 mg  5 mg Oral Daily Lindon Romp A, NP   5 mg at 02/10/20 0823  . benztropine (COGENTIN) tablet 1 mg  1 mg Oral BID Johnn Hai, MD      . carbamazepine (TEGRETOL) chewable tablet 100 mg  100 mg Oral Daily Lindon Romp A, NP   100 mg at 02/10/20 0823  . carbamazepine (TEGRETOL) chewable tablet 200 mg  200 mg Oral QHS Lindon Romp A, NP   200 mg at 02/09/20 2032  . carvedilol (COREG) tablet 25 mg  25 mg Oral BID Lindon Romp A, NP   25 mg at 02/10/20 [id number]  . haloperidol (HALDOL) tablet 10 mg  10 mg Oral QHS Johnn Hai, MD      . haloperidol (HALDOL) tablet 5 mg  5 mg Oral Q6H PRN Lindon Romp A, NP       Or  . haloperidol lactate (HALDOL) injection 5 mg  5 mg Intramuscular Q6H PRN Lindon Romp A, NP      . hydrOXYzine (ATARAX/VISTARIL) tablet 25 mg  25 mg Oral TID PRN Lindon Romp A, NP   25 mg at 02/09/20 1727  . magnesium hydroxide (MILK OF MAGNESIA) suspension 30 mL  30 mL Oral Daily PRN Lindon Romp A, NP      . temazepam (RESTORIL) capsule 30 mg  30 mg Oral QHS Lindon Romp A, NP   30 mg at 02/09/20 2031    Lab Results: No results found for this or any previous visit (from the past 48 hour(s)).  Blood Alcohol level:  Lab Results  Component Value Date   ETH <10 02/07/2020   ETH <10 0000000    Metabolic Disorder Labs: Lab Results  Component Value Date   HGBA1C 5.5 02/08/2020   MPG 111.15 02/08/2020   MPG 114.02 03/14/2019   Lab Results  Component Value Date   PROLACTIN 11.0 03/14/2019   Lab Results  Component Value Date   CHOL 140 02/08/2020   TRIG 124 02/08/2020   HDL 39 (L) 02/08/2020   CHOLHDL 3.6 02/08/2020   VLDL 25 02/08/2020   LDLCALC 76 02/08/2020   LDLCALC 60 12/23/2019     Physical Findings: AIMS: Facial and Oral Movements Muscles of Facial Expression: None, normal Lips and  Perioral Area: None, normal Jaw: None, normal Tongue: None, normal,Extremity Movements Upper (arms, wrists, hands, fingers): None, normal Lower (legs, knees, ankles, toes): None, normal, Trunk Movements Neck, shoulders, hips: None, normal, Overall Severity Severity of abnormal movements (highest score from questions above): None, normal Incapacitation due to abnormal movements: None, normal Patient's awareness of abnormal movements (rate only patient's report): No Awareness, Dental Status Current problems with teeth and/or dentures?: No Does patient usually wear dentures?: No  CIWA:  CIWA-Ar Total: 1 COWS:  COWS Total Score: 1  Musculoskeletal: Strength & Muscle Tone: within normal limits Gait & Station: normal Patient leans: N/A  Psychiatric Specialty Exam: Physical Exam  Review of Systems  Blood pressure 122/83, pulse 77, temperature 98 F (36.7 C), temperature source Oral, resp. rate 16, SpO2 100 %.There is no height or weight on file to calculate BMI.  General Appearance: Disheveled  Eye Contact:  Poor  Speech:  Slow and Slurred  Volume:  Decreased  Mood:  Dysphoric  Affect:  Blunt  Thought Process:  Irrelevant  Orientation:  Other:  Person general place and situation  Thought Content:  Illogical and Delusions  Suicidal Thoughts:  No  Homicidal Thoughts:  No  Memory:  Immediate;   Poor Recent;   Poor Remote;   Poor  Judgement:  Impaired  Insight:  Shallow  Psychomotor Activity:  Decreased  Concentration:  Attention Span: Poor  Recall:  Poor  Fund of Knowledge:  Poor  Language:  Poor  Akathisia:  Negative  Handed:  Right  AIMS (if indicated):     Assets:  Physical Health Resilience Social Support  ADL's:  Intact  Cognition:  WNL  Sleep:  Number of Hours: 7.5     Treatment Plan Summary: Daily contact with patient to assess and evaluate symptoms  and progress in treatment and Medication management  Current precautions decrease med burden lower Cogentin lower haloperidol monitor for safety on 15-minute checks T4 level to be checked  Johnn Hai, MD 02/10/2020, 11:53 AM

## 2020-02-11 NOTE — Progress Notes (Signed)
   02/10/20 2100  Psych Admission Type (Psych Patients Only)  Admission Status Voluntary  Psychosocial Assessment  Patient Complaints Anxiety  Eye Contact Fair  Facial Expression Anxious  Affect Appropriate to circumstance  Speech Logical/coherent  Interaction Minimal  Motor Activity Slow  Appearance/Hygiene Unremarkable;In hospital gown  Behavior Characteristics Cooperative;Anxious  Mood Depressed;Anxious  Thought Process  Coherency Circumstantial;Disorganized  Content Preoccupation  Delusions WDL  Perception WDL  Hallucination None reported or observed  Judgment Impaired  Confusion Mild  Danger to Self  Current suicidal ideation? Denies  Danger to Others  Danger to Others None reported or observed   Pt seen at med window. States she is not doing well today, but can't say why. Pt wants to go home but doesn't think that she will be able to. Doesn't elaborate further. Takes meds without incident.

## 2020-02-11 NOTE — BHH Group Notes (Signed)
LCSW Group Therapy Note  02/11/2020 1:57 PM  Type of Therapy/Topic: Group Therapy: Feelings about Diagnosis  Participation Level: Did Not Attend   Description of Group:  This group will allow patients to explore their thoughts and feelings about diagnoses they have received. Patients will be guided to explore their level of understanding and acceptance of these diagnoses. Facilitator will encourage patients to process their thoughts and feelings about the reactions of others to their diagnosis and will guide patients in identifying ways to discuss their diagnosis with significant others in their lives. This group will be process-oriented, with patients participating in exploration of their own experiences, giving and receiving support, and processing challenge from other group members.  Therapeutic Goals: 1. Patient will demonstrate understanding of diagnosis as evidenced by identifying two or more symptoms of the disorder 2. Patient will be able to express two feelings regarding the diagnosis 3. Patient will demonstrate their ability to communicate their needs through discussion and/or role play  Summary of Patient Progress:    Therapeutic Modalities:  Cognitive Behavioral Therapy Brief Therapy Feelings Identification   Rosaire Cueto, MSW, LCSWA Clinical Social Worker  

## 2020-02-11 NOTE — Progress Notes (Addendum)
Recreation Therapy Notes  Patient admitted to unit 3.15.21. Due to admission within last year, no new assessment conducted at this time. Last assessment conducted 1.28.21. Patient reports stressors as not taking medication correctly and having no hope because on not being with family.  Patient stated reason for admission was because she called the sheriff but did not want to go into why.  Patient seemed to be thought blocking at times.  Patient identified coping skills as isolation, tv, music, exercise, meditate, deep breathing and talk.  Patient expressed leisure interests are reading and watching sports.  Patient stated she doesn't read as much anymore and watches sports "when it's on".  Patient also stated she stopped going to church but couldn't explain why but expressed going to [employer].  Patient couldn't think of any strengths and identified area of improvement as "learn to lean and depend on Jesus".   Patient denies SI, HI, AVH at this time. Patient couldn't come up with a goal.     [name], LRT/CTRS    [name] A 02/11/2020 2:04 PM

## 2020-02-11 NOTE — Plan of Care (Signed)
Progress note  D: pt found in bed; compliant with medication administration. Pt seems confused and is disoriented to situation upon approach. Pt denies any physical pain. Pt seems anxious. Pt is pleasant but is guarded and minimal with assessment. Pt has been reclusive to their room most of the day. Pt denies si/hi/ah/vh and verbally agrees to approach staff if these become apparent or before harming themself/others while at [facility].  A: Pt provided support and encouragement. Pt given medication per protocol and standing orders. Q39m safety checks implemented and continued.  R: Pt safe on the unit. Will continue to monitor.  Pt progressing in the following metrics  Problem: Education: Goal: Knowledge of [facility] Education information/materials will improve Outcome: Progressing Goal: Emotional status will improve Outcome: Progressing Goal: Mental status will improve Outcome: Progressing Goal: Verbalization of understanding the information provided will improve Outcome: Progressing

## 2020-02-11 NOTE — Progress Notes (Signed)
Recreation Therapy Notes  Date: 3.17.21 Time: 1015 Location: [address] Dayroom  Group Topic: Anxiety  Goal Area(s) Addresses:  Patient will identify triggers to anxiety. Patient will identify ways to cope with anxiety. Patient will identify benefit of using coping skills for anxiety post d/c.   Intervention: Worksheet   Activity:  Introduction to Anxiety.  Patients were to identify triggers, physical symptoms, thoughts and coping skills for anxiety.  Education: Anxiety, Discharge Planning  Education Outcome: Acknowledges understanding/In group clarification offered/Needs additional education.   Clinical Observations/Feedback:  Pt did not attend group session.    [name], LRT/CTRS         [name] A 02/11/2020 10:57 AM

## 2020-02-11 NOTE — Progress Notes (Signed)
[facility] MD Progress Note  02/11/2020 11:44 AM Paula Dickson  MRN:  [id number] Subjective:   Patient more coherent today, alert and oriented to person place date and situation ("I know the day because I saw it on the board") but what is more key is that she is making coherent sentences, nonagitated and denying auditory or visual hallucinations she is also much clearer.  Her T4 is slightly high her T3 is normal and her TSH is very low, this is a pattern most consistent with nonthyroidal mild hyperthyroidism probably due to the presence of psychotropic agents to include Haldol and carbamazepine but not on lithium.  I do not think we need any treatment at this point in time.   Principal Problem: Schizoaffective disorder, bipolar type ([place]) Diagnosis: Principal Problem:   Schizoaffective disorder, bipolar type ([place]) Active Problems:   Schizoaffective disorder ([place])  Total Time spent with patient: 20 minutes  Past Psychiatric History: see eval  Past Medical History:  Past Medical History:  Diagnosis Date  . Bipolar affective disorder ([place])   . History of arthritis   . History of chicken pox   . History of depression   . History of genital warts   . history of heart murmur   . History of high blood pressure   . History of thyroid disease   . History of UTI   . Hypertension   . Low TSH level 07/13/2017  . Schizophrenia [facility])     Past Surgical History:  Procedure Laterality Date  . ABLATION ON ENDOMETRIOSIS    . CYST REMOVAL NECK     around 11 years ago /benign  . MULTIPLE TOOTH EXTRACTIONS     Family History:  Family History  Problem Relation Age of Onset  . Arthritis Father   . Hyperlipidemia Father   . High blood pressure Father   . Diabetes Sister   . Diabetes Mother   . Diabetes Brother   . Mental illness Brother   . Alcohol abuse Paternal Uncle   . Alcohol abuse Paternal Grandfather   . Breast cancer Maternal Aunt   . Breast cancer Paternal Aunt   . High blood  pressure Sister   . Mental illness Other        runs in family   Family Psychiatric  History: see eval Social History:  Social History   Substance and Sexual Activity  Alcohol Use No     Social History   Substance and Sexual Activity  Drug Use No    Social History   Socioeconomic History  . Marital status: Single    Spouse name: Not on file  . Number of children: Not on file  . Years of education: Not on file  . Highest education level: Not on file  Occupational History  . Not on file  Tobacco Use  . Smoking status: Never Smoker  . Smokeless tobacco: Never Used  Substance and Sexual Activity  . Alcohol use: No  . Drug use: No  . Sexual activity: Not Currently  Other Topics Concern  . Not on file  Social History Narrative  . Not on file   Social Determinants of Health   Financial Resource Strain:   . Difficulty of Paying Living Expenses:   Food Insecurity:   . Worried About Charity fundraiser in the Last Year:   . Arboriculturist in the Last Year:   Transportation Needs:   . Film/video editor (Medical):   Marland Kitchen Lack of  Transportation (Non-Medical):   Physical Activity:   . Days of Exercise per Week:   . Minutes of Exercise per Session:   Stress:   . Feeling of Stress :   Social Connections:   . Frequency of Communication with Friends and Family:   . Frequency of Social Gatherings with Friends and Family:   . Attends Religious Services:   . Active Member of Clubs or Organizations:   . Attends [name] Meetings:   Marland Kitchen Marital Status:    Additional Social History:                         Sleep: Fair  Appetite:  Fair  Current Medications: Current Facility-Administered Medications  Medication Dose Route Frequency Provider Last Rate Last Admin  . acetaminophen (TYLENOL) tablet 650 mg  650 mg Oral Q6H PRN Lindon Romp A, NP      . alum & mag hydroxide-simeth (MAALOX/MYLANTA) 200-200-20 MG/5ML suspension 30 mL  30 mL Oral Q4H PRN  Lindon Romp A, NP      . amLODipine (NORVASC) tablet 5 mg  5 mg Oral Daily Lindon Romp A, NP   5 mg at 02/11/20 0743  . benztropine (COGENTIN) tablet 1 mg  1 mg Oral BID Johnn Hai, MD   1 mg at 02/11/20 0744  . carbamazepine (TEGRETOL) chewable tablet 100 mg  100 mg Oral Daily Lindon Romp A, NP   100 mg at 02/11/20 0744  . carbamazepine (TEGRETOL) chewable tablet 200 mg  200 mg Oral QHS Lindon Romp A, NP   200 mg at 02/10/20 2108  . carvedilol (COREG) tablet 25 mg  25 mg Oral BID Lindon Romp A, NP   25 mg at 02/11/20 0744  . haloperidol (HALDOL) tablet 10 mg  10 mg Oral QHS Johnn Hai, MD   10 mg at 02/10/20 2107  . haloperidol (HALDOL) tablet 5 mg  5 mg Oral Q6H PRN Lindon Romp A, NP       Or  . haloperidol lactate (HALDOL) injection 5 mg  5 mg Intramuscular Q6H PRN Lindon Romp A, NP      . hydrOXYzine (ATARAX/VISTARIL) tablet 25 mg  25 mg Oral TID PRN Lindon Romp A, NP   25 mg at 02/09/20 1727  . magnesium hydroxide (MILK OF MAGNESIA) suspension 30 mL  30 mL Oral Daily PRN Lindon Romp A, NP      . temazepam (RESTORIL) capsule 30 mg  30 mg Oral QHS Lindon Romp A, NP   30 mg at 02/10/20 2107    Lab Results:  Results for orders placed or performed during the hospital encounter of 02/07/20 (from the past 48 hour(s))  T4, free     Status: Abnormal   Collection Time: 02/10/20  6:07 PM  Result Value Ref Range   Free T4 1.48 (H) 0.61 - 1.12 ng/dL    Comment: (NOTE) Biotin ingestion may interfere with free T4 tests. If the results are inconsistent with the TSH level, previous test results, or the clinical presentation, then consider biotin interference. If needed, order repeat testing after stopping biotin. Performed at [facility], [place] [address], [place], [place] [address]     Blood Alcohol level:  Lab Results  Component Value Date   [facility] <10 02/07/2020   ETH <10 0000000    Metabolic Disorder Labs: Lab Results  Component Value Date   HGBA1C 5.5 02/08/2020    MPG 111.15 02/08/2020   MPG 114.02  03/14/2019   Lab Results  Component Value Date   PROLACTIN 11.0 03/14/2019   Lab Results  Component Value Date   CHOL 140 02/08/2020   TRIG 124 02/08/2020   HDL 39 (L) 02/08/2020   CHOLHDL 3.6 02/08/2020   VLDL 25 02/08/2020   LDLCALC 76 02/08/2020   LDLCALC 60 12/23/2019    Physical Findings: AIMS: Facial and Oral Movements Muscles of Facial Expression: None, normal Lips and Perioral Area: None, normal Jaw: None, normal Tongue: None, normal,Extremity Movements Upper (arms, wrists, hands, fingers): None, normal Lower (legs, knees, ankles, toes): None, normal, Trunk Movements Neck, shoulders, hips: None, normal, Overall Severity Severity of abnormal movements (highest score from questions above): None, normal Incapacitation due to abnormal movements: None, normal Patient's awareness of abnormal movements (rate only patient's report): No Awareness, Dental Status Current problems with teeth and/or dentures?: No Does patient usually wear dentures?: No  CIWA:  CIWA-Ar Total: 1 COWS:  COWS Total Score: 1  Musculoskeletal: Strength & Muscle Tone: within normal limits Gait & Station: normal Patient leans: N/A  Psychiatric Specialty Exam: Physical Exam  Review of Systems  Blood pressure 130/84, pulse 72, temperature 98 F (36.7 C), temperature source Oral, resp. rate 16, SpO2 100 %.There is no height or weight on file to calculate BMI.  General Appearance: Casual  Eye Contact:  Fair  Speech:  Normal Rate  Volume:  Decreased  Mood:  Dysphoric  Affect:  Blunt  Thought Process:  Coherent and Goal Directed  Orientation:  Full (Time, Place, and Person)  Thought Content:  Logical  Suicidal Thoughts:  No  Homicidal Thoughts:  No  Memory:  Immediate;   Fair Recent;   Fair Remote;   Fair  Judgement:  Fair  Insight:  Good  Psychomotor Activity:  Normal  Concentration:  Concentration: Good and Attention Span: Fair  Recall:  [employer]  of Knowledge:  Fair  Language:  Good  Akathisia:  Negative  Handed:  Right  AIMS (if indicated):     Assets:  Resilience  ADL's:  Intact  Cognition:  WNL  Sleep:  Number of Hours: 8.75     Treatment Plan Summary: Daily contact with patient to assess and evaluate symptoms and progress in treatment and Medication management continue cognitive therapy reality based therapy monitor for safety no change in precautions, meds seem to be correct at this point time will need long-acting injectable reinstituted  Johnn Hai, MD 02/11/2020, 11:44 AM

## 2020-02-11 NOTE — Tx Team (Signed)
Interdisciplinary Treatment and Diagnostic Plan Update  02/11/2020 Time of Session: 9:00am Paula Dickson MRN: [id number]  Principal Diagnosis: Schizoaffective disorder, bipolar type [facility])  Secondary Diagnoses: Principal Problem:   Schizoaffective disorder, bipolar type ([place]) Active Problems:   Schizoaffective disorder ([place])   Current Medications:  Current Facility-Administered Medications  Medication Dose Route Frequency Provider Last Rate Last Admin  . acetaminophen (TYLENOL) tablet 650 mg  650 mg Oral Q6H PRN Lindon Romp A, NP      . alum & mag hydroxide-simeth (MAALOX/MYLANTA) 200-200-20 MG/5ML suspension 30 mL  30 mL Oral Q4H PRN Lindon Romp A, NP      . amLODipine (NORVASC) tablet 5 mg  5 mg Oral Daily Lindon Romp A, NP   5 mg at 02/11/20 0743  . benztropine (COGENTIN) tablet 1 mg  1 mg Oral BID Johnn Hai, MD   1 mg at 02/11/20 0744  . carbamazepine (TEGRETOL) chewable tablet 100 mg  100 mg Oral Daily Lindon Romp A, NP   100 mg at 02/11/20 0744  . carbamazepine (TEGRETOL) chewable tablet 200 mg  200 mg Oral QHS Lindon Romp A, NP   200 mg at 02/10/20 2108  . carvedilol (COREG) tablet 25 mg  25 mg Oral BID Lindon Romp A, NP   25 mg at 02/11/20 0744  . haloperidol (HALDOL) tablet 10 mg  10 mg Oral QHS Johnn Hai, MD   10 mg at 02/10/20 2107  . haloperidol (HALDOL) tablet 5 mg  5 mg Oral Q6H PRN Lindon Romp A, NP       Or  . haloperidol lactate (HALDOL) injection 5 mg  5 mg Intramuscular Q6H PRN Lindon Romp A, NP      . hydrOXYzine (ATARAX/VISTARIL) tablet 25 mg  25 mg Oral TID PRN Lindon Romp A, NP   25 mg at 02/09/20 1727  . magnesium hydroxide (MILK OF MAGNESIA) suspension 30 mL  30 mL Oral Daily PRN Lindon Romp A, NP      . temazepam (RESTORIL) capsule 30 mg  30 mg Oral QHS Lindon Romp A, NP   30 mg at 02/10/20 2107   PTA Medications: Medications Prior to Admission  Medication Sig Dispense Refill Last Dose  . amLODipine (NORVASC) 5 MG tablet Take 1  tablet (5 mg total) by mouth daily. 30 tablet 0 Unknown at Unknown time  . benztropine (COGENTIN) 2 MG tablet Take 1 tablet (2 mg total) by mouth 2 (two) times daily. 60 tablet 2 Unknown at Unknown time  . carbamazepine (TEGRETOL) 100 MG chewable tablet 1 in am 2 at hs (Patient taking differently: Chew 100-200 mg by mouth See admin instructions. 100mg  in AM and 200mg  at HS) 90 tablet 2 Unknown at Unknown time  . carvedilol (COREG) 25 MG tablet Take 1 tablet (25 mg total) by mouth 2 (two) times daily with a meal. 60 tablet 2 Unknown at Unknown time  . haloperidol (HALDOL) 20 MG tablet Take 1 tablet (20 mg total) by mouth at bedtime. 30 tablet 2 Unknown at Unknown time  . haloperidol decanoate (HALDOL DECANOATE) 100 MG/ML injection Inject 1.5 mLs (150 mg total) into the muscle every 30 (thirty) days. Due 3/3 1 mL 11 Unknown at Unknown time  . temazepam (RESTORIL) 30 MG capsule Take 1 capsule (30 mg total) by mouth at bedtime. 60 capsule 0 Unknown at Unknown time    Patient Stressors: Medication change or noncompliance  Patient Strengths: Motivation for treatment/growth Religious Affiliation Supportive family/friends  Treatment Modalities: Medication Management,  Group therapy, Case management,  1 to 1 session with clinician, Psychoeducation, Recreational therapy.   Physician Treatment Plan for Primary Diagnosis: Schizoaffective disorder, bipolar type ([place]) Long Term Goal(s):     Short Term Goals:    Medication Management: Evaluate patient's response, side effects, and tolerance of medication regimen.  Therapeutic Interventions: 1 to 1 sessions, Unit Group sessions and Medication administration.  Evaluation of Outcomes: Progressing  Physician Treatment Plan for Secondary Diagnosis: Principal Problem:   Schizoaffective disorder, bipolar type ([place]) Active Problems:   Schizoaffective disorder ([place])  Long Term Goal(s):     Short Term Goals:       Medication Management: Evaluate  patient's response, side effects, and tolerance of medication regimen.  Therapeutic Interventions: 1 to 1 sessions, Unit Group sessions and Medication administration.  Evaluation of Outcomes: Progressing   RN Treatment Plan for Primary Diagnosis: Schizoaffective disorder, bipolar type ([place]) Long Term Goal(s): Knowledge of disease and therapeutic regimen to maintain health will improve  Short Term Goals: Ability to verbalize feelings will improve, Ability to identify and develop effective coping behaviors will improve and Compliance with prescribed medications will improve  Medication Management: RN will administer medications as ordered by provider, will assess and evaluate patient's response and provide education to patient for prescribed medication. RN will report any adverse and/or side effects to prescribing provider.  Therapeutic Interventions: 1 on 1 counseling sessions, Psychoeducation, Medication administration, Evaluate responses to treatment, Monitor vital signs and CBGs as ordered, Perform/monitor CIWA, COWS, AIMS and Fall Risk screenings as ordered, Perform wound care treatments as ordered.  Evaluation of Outcomes: Progressing   LCSW Treatment Plan for Primary Diagnosis: Schizoaffective disorder, bipolar type ([place]) Long Term Goal(s): Safe transition to appropriate next level of care at discharge, Engage patient in therapeutic group addressing interpersonal concerns.  Short Term Goals: Engage patient in aftercare planning with referrals and resources, Increase social support, Facilitate acceptance of mental health diagnosis and concerns, Identify triggers associated with mental health/substance abuse issues and Increase skills for wellness and recovery  Therapeutic Interventions: Assess for all discharge needs, 1 to 1 time with Social worker, Explore available resources and support systems, Assess for adequacy in community support network, Educate family and significant other(s) on  suicide prevention, Complete Psychosocial Assessment, Interpersonal group therapy.  Evaluation of Outcomes: Progressing  Progress in Treatment: Attending groups: No. Participating in groups: No. Taking medication as prescribed: Yes. Toleration medication: Yes. Family/Significant other contact made: Yes, individual(s) contacted:  sister, Paula Dickson. Patient understands diagnosis: Yes. Discussing patient identified problems/goals with staff: Yes. Medical problems stabilized or resolved: Yes. Denies suicidal/homicidal ideation: Yes. Issues/concerns per patient self-inventory: Yes.  New problem(s) identified: Yes, Describe:  chronic medication non-compliance.  New Short Term/Long Term Goal(s):  medication management for mood stabilization; elimination of SI thoughts; development of comprehensive mental wellness/sobriety plan.  Patient Goals:    Discharge Plan or Barriers: Plans to return home with family and continue services with [facility] for medication management and therapy. Patient may benefit from ACTT services, CSW will make referral if patient is agreeable.  Reason for Continuation of Hospitalization: Anxiety Delusions  Medication stabilization  Estimated Length of Stay: 1-3 days  Attendees: Patient: 02/11/2020 10:42 AM  Physician:  02/11/2020 10:42 AM  Nursing:  02/11/2020 10:42 AM  RN Care Manager: 02/11/2020 10:42 AM  Social Worker: Stephanie Acre, [place] 02/11/2020 10:42 AM  Recreational Therapist:  02/11/2020 10:42 AM  Other:  02/11/2020 10:42 AM  Other:  02/11/2020 10:42 AM  Other: 02/11/2020 10:42 AM  Scribe for Treatment Team: Joellen Jersey, Latanya Presser 02/11/2020 10:42 AM

## 2020-02-11 NOTE — Progress Notes (Signed)
   02/11/20 2030  Psych Admission Type (Psych Patients Only)  Admission Status Voluntary  Psychosocial Assessment  Patient Complaints Anxiety;Worrying  Chemical engineer;Watchful  Facial Expression Anxious;Worried  Affect Anxious;Preoccupied  Soil scientist;Tangential  Interaction Cautious;Forwards little;Guarded;Minimal  Motor Activity Slow  Appearance/Hygiene In hospital gown  Behavior Characteristics Cooperative;Anxious;Fidgety  Mood Anxious;Depressed;Preoccupied;Pleasant  Thought Process  Coherency Disorganized  Content Preoccupation;Paranoia  Delusions Paranoid  Perception WDL  Hallucination None reported or observed  Judgment Impaired  Confusion Mild  Danger to Self  Current suicidal ideation? Denies  Danger to Others  Danger to Others None reported or observed   Pt seen at nurse's desk. Pt states that her day was "bad." Pt worried about where she will go when she leaves [facility]. Pt states, "I have no money." Pt wants to be close to home but states that she can't live alone. Support provided to pt. Pt told to speak with provider and social worker in the morning about her concerns. Assured pt that this writer would leave a note for treatment team about her situation.

## 2020-02-12 LAB — T3, FREE: T3, Free: 4.7 pg/mL — ABNORMAL HIGH (ref 2.0–4.4)

## 2020-02-12 MED ORDER — BENZTROPINE MESYLATE 1 MG PO TABS: 1.0000 mg | ORAL_TABLET | Freq: Two times a day (BID) | ORAL | 0 refills | Status: DC

## 2020-02-12 MED ORDER — CARBAMAZEPINE 100 MG PO CHEW: CHEWABLE_TABLET | ORAL | 0 refills | Status: DC

## 2020-02-12 MED ORDER — HALOPERIDOL DECANOATE 100 MG/ML IM SOLN: 150.0000 mg | INTRAMUSCULAR | 0 refills | Status: DC

## 2020-02-12 MED ORDER — TEMAZEPAM 30 MG PO CAPS: 30.0000 mg | ORAL_CAPSULE | Freq: Every day | ORAL | 0 refills | Status: DC

## 2020-02-12 MED ORDER — HALOPERIDOL 10 MG PO TABS: 10.0000 mg | ORAL_TABLET | Freq: Every day | ORAL | 0 refills | Status: DC

## 2020-02-12 MED ORDER — HYDROXYZINE HCL 25 MG PO TABS: 25.0000 mg | ORAL_TABLET | Freq: Three times a day (TID) | ORAL | 0 refills | Status: DC | PRN

## 2020-02-12 NOTE — [facility] Suicide Risk Assessment (Signed)
[facility] Discharge Suicide Risk Assessment   Principal Problem: Schizoaffective disorder, bipolar type [facility] Discharge Diagnoses: Principal Problem:   Schizoaffective disorder, bipolar type ([place]) Active Problems:   Schizoaffective disorder ([place])   Total Time spent with patient: 45 minutes  Musculoskeletal: Strength & Muscle Tone: within normal limits Gait & Station: normal Patient leans: N/A  Psychiatric Specialty Exam: Review of Systems  Blood pressure 140/90, pulse 73, temperature 98 F (36.7 C), temperature source Oral, resp. rate 16, SpO2 100 %.There is no height or weight on file to calculate BMI.  General Appearance: Casual  Eye Contact::  Good  Speech:  Clear and [id number]  Volume:  Decreased  Mood:  Euthymic  Affect:  Constricted  Thought Process:  Goal Directed  Orientation:  Full (Time, Place, and Person)  Thought Content:  Logical  Suicidal Thoughts:  No  Homicidal Thoughts:  No  Memory:  Immediate;   Fair Recent;   Fair Remote;   Fair  Judgement:  Fair  Insight:  Fair  Psychomotor Activity:  Normal  Concentration:  Good  Recall:  Good  Fund of Knowledge:Good  Language: Good  Akathisia:  Negative  Handed:  Right  AIMS (if indicated):     Assets:  Communication Skills Desire for Improvement  Sleep:  Number of Hours: 8  Cognition: WNL  ADL's:  Intact   Mental Status Per Nursing Assessment::   On Admission:  NA  Demographic Factors:  Unemployed  Loss Factors: Decrease in vocational status  Historical Factors: NA  Risk Reduction Factors:   Sense of responsibility to family, Religious beliefs about death, Living with another person, especially a relative and Positive social support  Continued Clinical Symptoms:  Previous Psychiatric Diagnoses and Treatments  Cognitive Features That Contribute To Risk:  Polarized thinking    Suicide Risk:  Minimal: No identifiable suicidal ideation.  Patients presenting with no risk factors but with morbid  ruminations; may be classified as minimal risk based on the severity of the depressive symptoms  Follow-up Information    [name] Follow up on 02/23/2020.   Why: You are scheduled for an appointment on 02/23/20 at 9:30 am.  This will be a virtual tele-health appointment.  Contact information: [address] [place] [address] [phone]           Plan Of Care/Follow-up recommendations:  Activity:  nl  Johnn Hai, MD 02/12/2020, 8:59 AM

## 2020-02-12 NOTE — Progress Notes (Signed)
Pt discharged to lobby. Pt was stable and appreciative at that time. All papers and prescriptions were given and valuables returned. Verbal understanding expressed. Denies SI/HI and A/VH. Pt given opportunity to express concerns and ask questions.  

## 2020-02-12 NOTE — Progress Notes (Signed)
  [facility] Adult Case Management Discharge Plan :  Will you be returning to the same living situation after discharge:  Yes,  home. At discharge, do you have transportation home?: Yes,  sister will pick up after 6pm today. Do you have the ability to pay for your medications: Yes,  Medicare.  Release of information consent forms completed and in the chart;  Patient's signature needed at discharge.  Patient to Follow up at: Follow-up Information    [name] Follow up on 02/23/2020.   Why: You are scheduled for an appointment on 02/23/20 at 9:30 am.  This will be a virtual tele-health appointment.  Contact information: [place] [address] [phone]           Next level of care provider has access to [place] and Suicide Prevention discussed: Yes,  with sister, [name]  Has patient been referred to the Quitline?: N/A patient is not a smoker  Patient has been referred for addiction treatment: Yes  Joellen Jersey, [place] 02/12/2020, 11:03 AM

## 2020-02-12 NOTE — Progress Notes (Signed)
Recreation Therapy Notes  INPATIENT RECREATION TR PLAN  Patient Details Name: Paula Dickson MRN: [id number] DOB: 21-Feb-1966 Today's Date: 02/12/2020  Rec Therapy Plan Is patient appropriate for Therapeutic Recreation?: Yes Treatment times per week: about 3 days Estimated Length of Stay: 5-7 days TR Treatment/Interventions: Group participation (Comment)  Discharge Criteria Pt will be discharged from therapy if:: Discharged Treatment plan/goals/alternatives discussed and agreed upon by:: Patient/family  Discharge Summary Short term goals set: See patient care plan Short term goals met: Not met Reason goals not met: Pt did not attend recreation therapy group sessions. Therapeutic equipment acquired: N/A Reason patient discharged from therapy: Discharge from hospital Pt/family agrees with progress & goals achieved: Yes Date patient discharged from therapy: 02/12/20    [name], LRT/CTRS  Ria Comment, Wright Gravely A 02/12/2020, 11:08 AM

## 2020-02-12 NOTE — Progress Notes (Signed)
Recreation Therapy Notes  Date: 3.18.21 Time: 0950 Location:  [address] Dayroom  Group Topic: Communication, Team Building, Problem Solving  Goal Area(s) Addresses:  Patient will effectively work with peer towards shared goal.  Patient will identify skills used to make activity successful.  Patient will identify how skills used during activity can be used to reach post d/c goals.   Intervention: STEM Activity  Activity: Geophysicist/field seismologist. In teams patients were given 12 plastic drinking straws and a length of masking tape. Using the materials provided patients were asked to build a landing pad to catch a golf ball dropped from approximately 6 feet in the air.   Education: Education officer, community, Discharge Planning   Education Outcome: Acknowledges education/In group clarification offered/Needs additional education.   Clinical Observations/Feedback: Pt did not attend group session.    [name], LRT/CTRS         Ria Comment, Alexine Pilant A 02/12/2020 11:22 AM
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
## 2020-02-12 NOTE — Plan of Care (Signed)
Pt did not attend group sessions.   Paula Dickson, LRT/CTRS 
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
## 2020-03-23 ENCOUNTER — Other Ambulatory Visit: Payer: Self-pay | Admitting: Physician Assistant

## 2020-03-23 DIAGNOSIS — R296 Repeated falls: Secondary | ICD-10-CM

## 2020-04-07 ENCOUNTER — Inpatient Hospital Stay: Admission: RE | Admit: 2020-04-07 | Payer: Medicare Other | Source: Ambulatory Visit

## 2020-04-30 ENCOUNTER — Ambulatory Visit
Admission: RE | Admit: 2020-04-30 | Discharge: 2020-04-30 | Disposition: A | Discharge status: Home / Self Care | Payer: Medicare Other | Source: Ambulatory Visit | Attending: Physician Assistant | Admitting: Physician Assistant

## 2020-04-30 DIAGNOSIS — R296 Repeated falls: Secondary | ICD-10-CM

## 2020-10-25 ENCOUNTER — Emergency Department (HOSPITAL_COMMUNITY)
Admission: EM | Admit: 2020-10-25 | Discharge: 2020-10-26 | Disposition: A | Discharge status: Home / Self Care | Payer: Medicare Other | Attending: Emergency Medicine | Admitting: Emergency Medicine

## 2020-10-25 ENCOUNTER — Encounter (HOSPITAL_COMMUNITY): Payer: Self-pay

## 2020-10-25 ENCOUNTER — Other Ambulatory Visit: Payer: Self-pay

## 2020-10-25 DIAGNOSIS — I10 Essential (primary) hypertension: Secondary | ICD-10-CM | POA: Insufficient documentation

## 2020-10-25 DIAGNOSIS — Z046 Encounter for general psychiatric examination, requested by authority: Secondary | ICD-10-CM | POA: Insufficient documentation

## 2020-10-25 DIAGNOSIS — Z79899 Other long term (current) drug therapy: Secondary | ICD-10-CM | POA: Insufficient documentation

## 2020-10-25 DIAGNOSIS — F29 Unspecified psychosis not due to a substance or known physiological condition: Secondary | ICD-10-CM

## 2020-10-25 DIAGNOSIS — Z20822 Contact with and (suspected) exposure to covid-19: Secondary | ICD-10-CM | POA: Insufficient documentation

## 2020-10-25 DIAGNOSIS — F250 Schizoaffective disorder, bipolar type: Secondary | ICD-10-CM | POA: Insufficient documentation

## 2020-10-25 LAB — COMPREHENSIVE METABOLIC PANEL
ALT: 17 U/L (ref 0–44)
AST: 17 U/L (ref 15–41)
Albumin: 4.2 g/dL (ref 3.5–5.0)
Alkaline Phosphatase: 78 U/L (ref 38–126)
Anion gap: 10 (ref 5–15)
BUN: 20 mg/dL (ref 6–20)
CO2: 25 mmol/L (ref 22–32)
Calcium: 10.1 mg/dL (ref 8.9–10.3)
Chloride: 103 mmol/L (ref 98–111)
Creatinine, Ser: 0.92 mg/dL (ref 0.44–1.00)
GFR, Estimated: >60 mL/min (ref >60)
Glucose, Bld: 113 mg/dL — ABNORMAL HIGH (ref 70–99)
Potassium: 3.4 mmol/L — ABNORMAL LOW (ref 3.5–5.1)
Sodium: 138 mmol/L (ref 135–145)
Total Bilirubin: 0.6 mg/dL (ref 0.3–1.2)
Total Protein: 7.2 g/dL (ref 6.5–8.1)

## 2020-10-25 LAB — CBC
HCT: 40.3 % (ref 36.0–46.0)
Hemoglobin: 13.2 g/dL (ref 12.0–15.0)
MCH: 28.4 pg (ref 26.0–34.0)
MCHC: 32.8 g/dL (ref 30.0–36.0)
MCV: 86.9 fL (ref 80.0–100.0)
Platelets: 193 10*3/uL (ref 150–400)
RBC: 4.64 MIL/uL (ref 3.87–5.11)
RDW: 12.4 % (ref 11.5–15.5)
WBC: 5.7 10*3/uL (ref 4.0–10.5)
nRBC: 0 % (ref 0.0–0.2)

## 2020-10-25 LAB — ETHANOL: Alcohol, Ethyl (B): <10 mg/dL (ref <10)

## 2020-10-25 LAB — RAPID URINE DRUG SCREEN, HOSP PERFORMED
Amphetamines: NOT DETECTED
Barbiturates: NOT DETECTED
Benzodiazepines: NOT DETECTED
Cocaine: NOT DETECTED
Opiates: NOT DETECTED
Tetrahydrocannabinol: NOT DETECTED

## 2020-10-25 MED ORDER — HALOPERIDOL 5 MG PO TABS
10.0000 mg | ORAL_TABLET | Freq: Every day | ORAL | Status: DC
  Administered 2020-10-26: 10 mg via ORAL
  Filled 2020-10-25: qty 2

## 2020-10-25 MED ORDER — BENZTROPINE MESYLATE 1 MG PO TABS
1.0000 mg | ORAL_TABLET | Freq: Two times a day (BID) | ORAL | Status: DC
  Administered 2020-10-26: 1 mg via ORAL
  Filled 2020-10-25: qty 1

## 2020-10-25 MED ORDER — ACETAMINOPHEN 325 MG PO TABS: 650.0000 mg | ORAL_TABLET | ORAL | Status: DC | PRN

## 2020-10-25 MED ORDER — AMLODIPINE BESYLATE 5 MG PO TABS
5.0000 mg | ORAL_TABLET | Freq: Every day | ORAL | Status: DC
  Administered 2020-10-26: 5 mg via ORAL
  Filled 2020-10-25: qty 1

## 2020-10-25 MED ORDER — CARBAMAZEPINE 100 MG PO CHEW
200.0000 mg | CHEWABLE_TABLET | Freq: Every day | ORAL | Status: DC
  Filled 2020-10-25: qty 2

## 2020-10-25 MED ORDER — CARVEDILOL 12.5 MG PO TABS
25.0000 mg | ORAL_TABLET | Freq: Two times a day (BID) | ORAL | Status: DC
  Administered 2020-10-26: 25 mg via ORAL
  Filled 2020-10-25: qty 2
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
## 2020-10-25 NOTE — BH Assessment (Signed)
Pt has been accepted to [facility] under the service of Dr. Mallie Darting, room 502-2. Pt can be transferred after 0830. Notified Johnn Hai, PA-C and Laurice Record, RN of acceptance.   Evelena Peat, [facility], [facility] Triage Specialist [phone]

## 2020-10-25 NOTE — ED Provider Notes (Addendum)
[place] DEPT Provider Note   CSN: [id number] Arrival date & time: 10/25/20  1621     History Chief Complaint  Patient presents with  . Psychiatric Evaluation    Paula Dickson is a 54 y.o. female.  Pt's siblings report pt is not taking her Psych medications.  Pt has a history of schizophrenia.  Family reports pt has been Ecologist and calling them to come out.  Pt is unable to give me a history.    The history is provided by a relative. No language interpreter was used.       Past Medical History:  Diagnosis Date  . Bipolar affective disorder ([place])   . History of arthritis   . History of chicken pox   . History of depression   . History of genital warts   . history of heart murmur   . History of high blood pressure   . History of thyroid disease   . History of UTI   . Hypertension   . Low TSH level 07/13/2017  . Schizophrenia [facility])     Patient Active Problem List   Diagnosis Date Noted  . Schizoaffective disorder, bipolar type ([place]) 02/08/2020  . Acute metabolic encephalopathy 62/69/4854  . AMS (altered mental status) 11/17/2019  . Lithium toxicity 11/17/2019  . Hyperthyroidism 10/14/2019  . Medication side effect, initial encounter   . Schizoaffective disorder ([place]) 03/13/2019  . Severe mixed bipolar 1 disorder without psychosis ([place]) 03/13/2019  . Bipolar affective disorder, current episode mixed ([place]) 11/01/2018  . Toxic encephalopathy 07/14/2017  . Suicide attempt ([place]) 07/13/2017  . Low TSH level 07/13/2017  . Overdose of psychotropic 07/12/2017  . Valproic acid toxicity 07/12/2017  . Hyperammonemia ([place]) 07/12/2017  . Schizophrenia ([place]) 07/12/2017  . Bipolar disorder, most recent episode depressed ([place]) 07/12/2017  . Insomnia 12/27/2015  . Abnormal urinalysis 12/27/2015  . 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 Severe manic bipolar 1 disorder with psychotic behavior ([place]) 06/14/2015    Past Surgical  History:  Procedure Laterality Date  . ABLATION ON ENDOMETRIOSIS    . CYST REMOVAL NECK     around 11 years ago /benign  . MULTIPLE TOOTH EXTRACTIONS       OB History   No obstetric history on file.     Family History  Problem Relation Age of Onset  . Arthritis Father   . Hyperlipidemia Father   . High blood pressure Father   . Diabetes Sister   . Diabetes Mother   . Diabetes Brother   . Mental illness Brother   . Alcohol abuse Paternal Uncle   . Alcohol abuse Paternal Grandfather   . Breast cancer Maternal Aunt   . Breast cancer Paternal Aunt   . High blood pressure Sister   . Mental illness Other        runs in family    Social History   Tobacco Use  . Smoking status: Never Smoker  . Smokeless tobacco: Never Used  Vaping Use  . Vaping Use: Never used  Substance Use Topics  . Alcohol use: No  . Drug use: No    Home Medications Prior to Admission medications   Medication Sig Start Date End Date Taking? Authorizing Provider  amLODipine (NORVASC) 5 MG tablet Take 1 tablet (5 mg total) by mouth daily. 11/27/19   Nita Sells, MD  benztropine (COGENTIN) 1 MG tablet Take 1 tablet (1 mg total) by mouth 2 (two) times daily. For prevention of drug induced  tremors 02/12/20   Lindell Spar I, NP  carbamazepine (TEGRETOL) 100 MG chewable tablet Take 1 tablet (100 mg) by mouth in the morning & 2 tablets at bedtime: For mood stabilization 02/12/20   Lindell Spar I, NP  carvedilol (COREG) 25 MG tablet Take 1 tablet (25 mg total) by mouth 2 (two) times daily with a meal. 04/01/19   Johnn Hai, MD  haloperidol (HALDOL) 10 MG tablet Take 1 tablet (10 mg total) by mouth at bedtime. For mood control 02/12/20   Lindell Spar I, NP  haloperidol decanoate (HALDOL DECANOATE) 100 MG/ML injection Inject 1.5 mLs (150 mg total) into the muscle every 30 (thirty) days. (Due on 02/27/20): For mood control 02/27/20   Lindell Spar I, NP  hydrOXYzine (ATARAX/VISTARIL) 25 MG tablet Take 1 tablet  (25 mg total) by mouth 3 (three) times daily as needed for anxiety. 02/12/20   Lindell Spar I, NP  temazepam (RESTORIL) 30 MG capsule Take 1 capsule (30 mg total) by mouth at bedtime. For sleep 02/12/20   Lindell Spar I, NP    Allergies    Patient has no known allergies.  Review of Systems   Review of Systems  Unable to perform ROS: Psychiatric disorder  All other systems reviewed and are negative.   Physical Exam Updated Vital Signs BP 133/89 (BP Location: Left Arm)   Pulse 92   Temp 98.4 F (36.9 C) (Oral)   Resp 17   Ht 5\' 5"  (1.651 m)   Wt 74.8 kg   SpO2 97%   BMI 27.46 kg/m   Physical Exam Vitals and nursing note reviewed.  Constitutional:      Appearance: She is well-developed.  HENT:     Head: Normocephalic.  Cardiovascular:     Rate and Rhythm: Normal rate.     Pulses: Normal pulses.  Pulmonary:     Effort: Pulmonary effort is normal.  Abdominal:     General: Abdomen is flat. There is no distension.  Musculoskeletal:        General: Normal range of motion.     Cervical back: Normal range of motion.  Skin:    General: Skin is warm.  Neurological:     General: No focal deficit present.     Mental Status: She is alert and oriented to person, place, and time.  Psychiatric:        Mood and Affect: Mood normal.     ED Results / Procedures / Treatments   Labs (all labs ordered are listed, but only abnormal results are displayed) Labs Reviewed  COMPREHENSIVE METABOLIC PANEL  ETHANOL  CBC  RAPID URINE DRUG SCREEN, HOSP PERFORMED    EKG None  Radiology No results found.  Procedures Procedures (including critical care time)  Medications Ordered in ED Medications - No data to display  ED Course  I have reviewed the triage vital signs and the nursing notes.  Pertinent labs & imaging results that were available during my care of the patient were reviewed by me and considered in my medical decision making (see chart for details).    MDM  Rules/Calculators/A&P                          MDM:  Pt will need inpatient treatment for acute psychosis second to medication noncompliance  TTS assessed pt and will admit for inpatient treatment Final Clinical Impression(s) / ED Diagnoses Final diagnoses:  Psychosis, unspecified psychosis type ([place])    Rx /  DC Orders ED Discharge Orders    None       Sidney Ace 10/25/20 1958    Sidney Ace 10/25/20 2204    Charlesetta Shanks, MD 11/05/20 [phone]
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
## 2020-10-26 ENCOUNTER — Inpatient Hospital Stay (HOSPITAL_COMMUNITY)
Admission: AD | Admit: 2020-10-26 | Discharge: 2020-11-12 | DRG: 885 | Disposition: A | Discharge status: Home / Self Care | Payer: Medicare Other | Source: Other Acute Inpatient Hospital | Attending: Psychiatry | Admitting: Psychiatry

## 2020-10-26 ENCOUNTER — Encounter (HOSPITAL_COMMUNITY): Payer: Self-pay | Admitting: Nurse Practitioner

## 2020-10-26 DIAGNOSIS — R4182 Altered mental status, unspecified: Secondary | ICD-10-CM | POA: Diagnosis present

## 2020-10-26 DIAGNOSIS — F250 Schizoaffective disorder, bipolar type: Secondary | ICD-10-CM | POA: Diagnosis present

## 2020-10-26 DIAGNOSIS — F209 Schizophrenia, unspecified: Secondary | ICD-10-CM | POA: Diagnosis present

## 2020-10-26 DIAGNOSIS — I10 Essential (primary) hypertension: Secondary | ICD-10-CM | POA: Diagnosis present

## 2020-10-26 DIAGNOSIS — E119 Type 2 diabetes mellitus without complications: Secondary | ICD-10-CM | POA: Diagnosis present

## 2020-10-26 DIAGNOSIS — Z83438 Family history of other disorder of lipoprotein metabolism and other lipidemia: Secondary | ICD-10-CM | POA: Diagnosis not present

## 2020-10-26 DIAGNOSIS — M1990 Unspecified osteoarthritis, unspecified site: Secondary | ICD-10-CM | POA: Diagnosis present

## 2020-10-26 DIAGNOSIS — Z803 Family history of malignant neoplasm of breast: Secondary | ICD-10-CM

## 2020-10-26 DIAGNOSIS — F200 Paranoid schizophrenia: Secondary | ICD-10-CM | POA: Diagnosis not present

## 2020-10-26 DIAGNOSIS — Z833 Family history of diabetes mellitus: Secondary | ICD-10-CM | POA: Diagnosis not present

## 2020-10-26 DIAGNOSIS — F54 Psychological and behavioral factors associated with disorders or diseases classified elsewhere: Secondary | ICD-10-CM | POA: Diagnosis not present

## 2020-10-26 DIAGNOSIS — Z818 Family history of other mental and behavioral disorders: Secondary | ICD-10-CM | POA: Diagnosis not present

## 2020-10-26 DIAGNOSIS — Z23 Encounter for immunization: Secondary | ICD-10-CM

## 2020-10-26 DIAGNOSIS — R631 Polydipsia: Secondary | ICD-10-CM | POA: Diagnosis present

## 2020-10-26 DIAGNOSIS — Z8261 Family history of arthritis: Secondary | ICD-10-CM

## 2020-10-26 DIAGNOSIS — E042 Nontoxic multinodular goiter: Secondary | ICD-10-CM | POA: Diagnosis present

## 2020-10-26 DIAGNOSIS — F3164 Bipolar disorder, current episode mixed, severe, with psychotic features: Secondary | ICD-10-CM | POA: Diagnosis not present

## 2020-10-26 DIAGNOSIS — F3160 Bipolar disorder, current episode mixed, unspecified: Secondary | ICD-10-CM | POA: Diagnosis present

## 2020-10-26 LAB — LIPID PANEL
Cholesterol: 173 mg/dL (ref 0–200)
HDL: 46 mg/dL (ref >40)
LDL Cholesterol: 95 mg/dL (ref 0–99)
Total CHOL/HDL Ratio: 3.8 RATIO
Triglycerides: 158 mg/dL — ABNORMAL HIGH (ref <150)
VLDL: 32 mg/dL (ref 0–40)

## 2020-10-26 LAB — RESP PANEL BY RT-PCR (FLU A&B, COVID) ARPGX2
Influenza A by PCR: NEGATIVE
Influenza B by PCR: NEGATIVE
SARS Coronavirus 2 by RT PCR: NEGATIVE

## 2020-10-26 LAB — TSH: TSH: 0.023 u[IU]/mL — ABNORMAL LOW (ref 0.350–4.500)

## 2020-10-26 LAB — CARBAMAZEPINE LEVEL, TOTAL: Carbamazepine Lvl: <2 ug/mL — ABNORMAL LOW (ref 4.0–12.0)

## 2020-10-26 MED ORDER — ZIPRASIDONE MESYLATE 20 MG IM SOLR
20.0000 mg | Freq: Four times a day (QID) | INTRAMUSCULAR | Status: DC | PRN
  Administered 2020-10-26 – 2020-10-29 (×5): 20 mg via INTRAMUSCULAR
  Filled 2020-10-26 (×5): qty 20

## 2020-10-26 MED ORDER — BENZTROPINE MESYLATE 1 MG PO TABS
1.0000 mg | ORAL_TABLET | Freq: Two times a day (BID) | ORAL | Status: AC | PRN
  Administered 2020-10-28 – 2020-10-31 (×5): 1 mg via ORAL
  Filled 2020-10-26 (×3): qty 1

## 2020-10-26 MED ORDER — ACETAMINOPHEN 325 MG PO TABS
650.0000 mg | ORAL_TABLET | Freq: Four times a day (QID) | ORAL | Status: DC | PRN
  Administered 2020-10-27 – 2020-10-31 (×3): 650 mg via ORAL
  Filled 2020-10-26 (×3): qty 2

## 2020-10-26 MED ORDER — POTASSIUM CHLORIDE CRYS ER 20 MEQ PO TBCR
20.0000 meq | EXTENDED_RELEASE_TABLET | Freq: Once | ORAL | Status: AC
  Administered 2020-10-26: 20 meq via ORAL
  Filled 2020-10-26: qty 1

## 2020-10-26 MED ORDER — BENZTROPINE MESYLATE 1 MG PO TABS: 1.0000 mg | ORAL_TABLET | Freq: Four times a day (QID) | ORAL | Status: DC | PRN

## 2020-10-26 MED ORDER — HALOPERIDOL 5 MG PO TABS
10.0000 mg | ORAL_TABLET | Freq: Four times a day (QID) | ORAL | Status: DC | PRN
  Administered 2020-10-26 – 2020-11-11 (×13): 10 mg via ORAL
  Filled 2020-10-26 (×10): qty 2

## 2020-10-26 MED ORDER — HALOPERIDOL 5 MG PO TABS: 5.0000 mg | ORAL_TABLET | Freq: Four times a day (QID) | ORAL | Status: DC | PRN

## 2020-10-26 MED ORDER — AMLODIPINE BESYLATE 5 MG PO TABS
5.0000 mg | ORAL_TABLET | Freq: Every day | ORAL | Status: DC
  Administered 2020-10-26 – 2020-10-30 (×5): 5 mg via ORAL
  Filled 2020-10-26 (×7): qty 1

## 2020-10-26 MED ORDER — HALOPERIDOL 5 MG PO TABS
10.0000 mg | ORAL_TABLET | Freq: Every day | ORAL | Status: DC
  Administered 2020-10-26: 10 mg via ORAL
  Filled 2020-10-26 (×3): qty 2

## 2020-10-26 MED ORDER — HYDROXYZINE HCL 25 MG PO TABS
25.0000 mg | ORAL_TABLET | Freq: Three times a day (TID) | ORAL | Status: DC | PRN
  Administered 2020-10-26 – 2020-11-11 (×24): 25 mg via ORAL
  Filled 2020-10-26 (×25): qty 1

## 2020-10-26 MED ORDER — CARBAMAZEPINE 100 MG PO CHEW
200.0000 mg | CHEWABLE_TABLET | Freq: Every day | ORAL | Status: DC
  Administered 2020-10-26 – 2020-10-27 (×2): 200 mg via ORAL
  Filled 2020-10-26 (×4): qty 2

## 2020-10-26 MED ORDER — ALUM & MAG HYDROXIDE-SIMETH 200-200-20 MG/5ML PO SUSP: 30.0000 mL | ORAL | Status: DC | PRN

## 2020-10-26 MED ORDER — CARVEDILOL 25 MG PO TABS
25.0000 mg | ORAL_TABLET | Freq: Two times a day (BID) | ORAL | Status: DC
  Administered 2020-10-26 – 2020-11-09 (×27): 25 mg via ORAL
  Filled 2020-10-26 (×30): qty 1

## 2020-10-26 MED ORDER — MAGNESIUM HYDROXIDE 400 MG/5ML PO SUSP: 30.0000 mL | Freq: Every day | ORAL | Status: DC | PRN

## 2020-10-26 MED ORDER — BENZTROPINE MESYLATE 1 MG PO TABS
1.0000 mg | ORAL_TABLET | Freq: Two times a day (BID) | ORAL | Status: DC
  Administered 2020-10-26 – 2020-10-28 (×4): 1 mg via ORAL
  Filled 2020-10-26 (×7): qty 1

## 2020-10-26 NOTE — ED Notes (Signed)
Patient is pacing room.

## 2020-10-26 NOTE — BH Assessment (Signed)
[place] Assessment Progress Note  Per Lindon Romp, NP, this pt requires psychiatric hospitalization at this time.  Pt has been assigned to [facility] Rm 502-2; per Kathalene Frames, RN [facility] will be ready to receive pt after 10:00.  Pt has signed Voluntary Admission and Consent for Treatment and signed form has been faxed to [facility].  EDP Sherwood Gambler, MD and pt's nurse, Diane, have been notified, and Diane agrees to send original paperwork along with pt via Safe Transport, and to call report to [phone].  Jalene Mullet, [place] Coordinator [phone]

## 2020-10-26 NOTE — ED Notes (Signed)
PT HAD SHOWER

## 2020-10-26 NOTE — ED Notes (Signed)
Per Tory Emerald RN, manager, Derenda Mis at [facility] is putting hold on pt's transport pending their communication with infection control.
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
## 2020-10-26 NOTE — ED Notes (Signed)
Using hospital bed-bug protocols pt room was isolated, pt belongings isolated, and pt given a shower, moved to clean room. Have not been able to get in touch with family to pick up pt belongings which are in a double bio-hazard bag.

## 2020-10-26 NOTE — Tx Team (Signed)
Initial Treatment Plan 10/26/2020 1:49 PM Paula Dickson [id number]    PATIENT STRESSORS: Financial difficulties Health problems Medication change or noncompliance   PATIENT STRENGTHS: Active sense of humor   PATIENT IDENTIFIED PROBLEMS: anxiety  depression  confusion  anger               DISCHARGE CRITERIA:  Ability to meet basic life and health needs Adequate post-discharge living arrangements Improved stabilization in mood, thinking, and/or behavior Safe-care adequate arrangements made  PRELIMINARY DISCHARGE PLAN: Attend aftercare/continuing care group Attend PHP/IOP Placement in alternative living arrangements  PATIENT/FAMILY INVOLVEMENT: This treatment plan has been presented to and reviewed with the patient, Paula Dickson.  The patient has been given the opportunity to ask questions and make suggestions.  Roxanna Mew, RN 10/26/2020, 1:49 PM
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
## 2020-10-26 NOTE — ED Notes (Signed)
Transported to [facility] by [employer]. Belongings (all in double bio-hazard bag taped shut) given to [employer]. Pt was cooperative with the transfer to [facility].

## 2020-10-26 NOTE — ED Notes (Signed)
Report completed to [facility] at [facility]. Transport called.

## 2020-10-26 NOTE — ED Notes (Signed)
PT ASKED FOR SANDWICH AND SOMETHING TO DRINK, WHICH WAS GIVEN.

## 2020-10-26 NOTE — ED Notes (Signed)
Report given to Paula Dickson at [facility] who stopped report progress when she learned that pt has h/o bed bugs. Reassured [name] that protocols were followed.

## 2020-10-26 NOTE — ED Notes (Signed)
Repeat vitals not obtained due to bed bugs.
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
## 2020-10-26 NOTE — Progress Notes (Signed)
Adult Psychoeducational Group Note  Date:  10/26/2020 Time:  10:04 PM  Group Topic/Focus:  Wrap-Up Group:   The focus of this group is to help patients review their daily goal of treatment and discuss progress on daily workbooks.  Participation Level:  Did Not Attend  Participation Quality:  Did Not Attend  Affect:  Did Not Attend  Cognitive:  Did Not Attend  Insight: None  Engagement in Group:  Did Not Attend  Modes of Intervention:  Did Not Attend  Additional Comments:  Pt did not attend evening wrap up group tonight.  [name] 10/26/2020, 10:04 PM

## 2020-10-27 DIAGNOSIS — F250 Schizoaffective disorder, bipolar type: Secondary | ICD-10-CM | POA: Diagnosis not present

## 2020-10-27 LAB — T4, FREE: Free T4: 1.22 ng/dL — ABNORMAL HIGH (ref 0.61–1.12)

## 2020-10-27 LAB — HEMOGLOBIN A1C
Hgb A1c MFr Bld: 6 % — ABNORMAL HIGH (ref 4.8–5.6)
Mean Plasma Glucose: 125.5 mg/dL

## 2020-10-27 MED ORDER — HALOPERIDOL 5 MG PO TABS
15.0000 mg | ORAL_TABLET | Freq: Every day | ORAL | Status: DC
  Administered 2020-10-27: 15 mg via ORAL
  Filled 2020-10-27 (×3): qty 3

## 2020-10-27 MED ORDER — HALOPERIDOL 5 MG PO TABS
10.0000 mg | ORAL_TABLET | Freq: Every day | ORAL | Status: DC
  Administered 2020-10-27 – 2020-10-28 (×2): 10 mg via ORAL
  Filled 2020-10-27 (×4): qty 2

## 2020-10-27 MED ORDER — HALOPERIDOL LACTATE 5 MG/ML IJ SOLN
10.0000 mg | Freq: Every day | INTRAMUSCULAR | Status: DC
  Filled 2020-10-27 (×5): qty 2

## 2020-10-27 NOTE — Progress Notes (Signed)
Patient has been pacing the halls, disrobing, laughing and crying simultaneously.  Patient has required much redirection.  Patent's speech is garbled and incoherent.  Patient remains disorganized and in monitored on 1:1 per protocol.   Patient is able to maintain safety.

## 2020-10-27 NOTE — Progress Notes (Signed)
[facility] Second Physician Opinion Progress Note for Medication Administration to Non-consenting Patients (For Involuntarily Committed Patients)  Patient: Paula Dickson Date of Birth: [id number] MRN: [id number]  Reason for the Medication: The patient, without the benefit of the specific treatment measure, is incapable of participating in any available treatment plan that will give the patient a realistic opportunity of improving the patient's condition. There is, without the benefit of the specific treatment measure, a significant possibility that the patient will harm self or others before improvement of the patient's condition is realized.  Consideration of Side Effects: Consideration of the side effects related to the medication plan has been given.  Rationale for Medication Administration: Patient with gross disorganization, unable to maintain ADLs, continues to voice psychotic delusions and attempt to rationally discuss need for medication.  Patient is unlikely to improve without use of medications.  Her level of psychosis puts her at risk for herself and others in the community.  I agree with medications over objection.    Paula Dials, MD 10/27/20  12:28 PM   This documentation is good for (7) seven days from the date of the MD signature. New documentation must be completed every seven (7) days with detailed justification in the medical record if the patient requires continued non-emergent administration of psychotropic medications.

## 2020-10-27 NOTE — Progress Notes (Addendum)
Nursing Close observation note D:Pt observed lying in bed awake. RR even and unlabored. No distress noted. A: Close observation continues for safety  R: Pt remains safe

## 2020-10-27 NOTE — Progress Notes (Signed)
[name] MD Progress Note  10/27/2020 11:46 AM Paula Dickson  MRN:  [id number] Subjective: Patient is a 54 year old female with a reported past psychiatric history significant for schizoaffective disorder; bipolar type who originally presented to the [facility] emergency department on 10/25/2020 after her family members had brought her to the hospital because of worsening manic symptoms.  Objective: Patient is seen and examined.  Patient is a 54 year old female with the above-stated past psychiatric history who is seen in follow-up.  She is perhaps only slightly better today.  She remains disorganized.  She remains intrusive.  Unfortunately she received Geodon IM on 2 occasions, and actually had been given Haldol as well.  It was described that the patient was unable to have logical conversation.  She remained on one-to-one.  This morning she is afebrile.  Her blood pressure is 137/86.  Pulse was 93.  Despite the as needed medication she still essentially did not sleep last p.m.  Review of her laboratories from 11/29 again revealed a low potassium at 3.4 which was supplemented.  Her triglycerides are 158.  The rest of her lipid panel was normal.  Her CBC from 11/29 was normal.  Tegretol level on 11/30 was less than 2.  Hemoglobin A1c is 6.0.  Her TSH is significantly low at 0.023.  Drug screen was negative.  The notes in the chart suggests a history of thyroid disease, as well as a low TSH.  Her TSH from 02/08/2020 was less her thyroglobulin antibody at 12/28/2019 was less than 1.  Than 0.010.  Her TSH from 12/28/2019 was less than 0.10.  Her T3 from 12/28/2019 was 28.  Her free T3 from 12/28/2019 was 4.7.  The laboratories from 12/28/2019 were actually written for by me when I was covering [place] psychiatric unit.  A discharge summary from 11/27/2019 from the medical service showed that she was hospitalized at that time secondary to lithium toxicity.  It looks as  though there was anticipation that resolution of her thyroid issues would clear with the cessation of the lithium.  She had a thyroid ultrasound in November 2020 which showed a benign-appearing nodule less than 1 year prior.  No goiter was noted at that time.  Surprisingly I apparently had written the ultrasound of her thyroid in December 2019.  Findings at that time were suggestive of a multinodular goiter.  There was a nodule that met criteria to recommend percutaneous sampling.  There were 2 other nodules that met criteria to recommend 1 year follow-up ultrasound.  It does not appear as though that was done.  Principal Problem: <principal problem not specified> Diagnosis: Active Problems:   Schizophrenia ([place])  Total Time spent with patient: 20 minutes  Past Psychiatric History: See admission H&P  Past Medical History:  Past Medical History:  Diagnosis Date  . Bipolar affective disorder ([place])   . History of arthritis   . History of chicken pox   . History of depression   . History of genital warts   . history of heart murmur   . History of high blood pressure   . History of thyroid disease   . History of UTI   . Hypertension   . Low TSH level 07/13/2017  . Schizophrenia [name])     Past Surgical History:  Procedure Laterality Date  . ABLATION ON ENDOMETRIOSIS    . CYST REMOVAL NECK     around 11 years ago /benign  . MULTIPLE TOOTH EXTRACTIONS  Family History:  Family History  Problem Relation Age of Onset  . Arthritis Father   . Hyperlipidemia Father   . High blood pressure Father   . Diabetes Sister   . Diabetes Mother   . Diabetes Brother   . Mental illness Brother   . Alcohol abuse Paternal Uncle   . Alcohol abuse Paternal Grandfather   . Breast cancer Maternal Aunt   . Breast cancer Paternal Aunt   . High blood pressure Sister   . Mental illness Other        runs in family   Family Psychiatric  History: See admission H&P Social History:  Social History    Substance and Sexual Activity  Alcohol Use No     Social History   Substance and Sexual Activity  Drug Use No    Social History   Socioeconomic History  . Marital status: Single    Spouse name: Not on file  . Number of children: Not on file  . Years of education: Not on file  . Highest education level: Patient refused  Occupational History  . Not on file  Tobacco Use  . Smoking status: Never Smoker  . Smokeless tobacco: Never Used  Vaping Use  . Vaping Use: Never used  Substance and Sexual Activity  . Alcohol use: No  . Drug use: No  . Sexual activity: Not Currently  Other Topics Concern  . Not on file  Social History Narrative  . Not on file   Social Determinants of Health   Financial Resource Strain:   . Difficulty of Paying Living Expenses: Not on file  Food Insecurity:   . Worried About Charity fundraiser in the Last Year: Not on file  . Ran Out of Food in the Last Year: Not on file  Transportation Needs:   . Lack of Transportation (Medical): Not on file  . Lack of Transportation (Non-Medical): Not on file  Physical Activity:   . Days of Exercise per Week: Not on file  . Minutes of Exercise per Session: Not on file  Stress:   . Feeling of Stress : Not on file  Social Connections:   . Frequency of Communication with Friends and Family: Not on file  . Frequency of Social Gatherings with Friends and Family: Not on file  . Attends Religious Services: Not on file  . Active Member of Clubs or Organizations: Not on file  . Attends [name] Meetings: Not on file  . Marital Status: Not on file   Additional Social History:                         Sleep: Poor  Appetite:  Fair  Current Medications: Current Facility-Administered Medications  Medication Dose Route Frequency Provider Last Rate Last Admin  . acetaminophen (TYLENOL) tablet 650 mg  650 mg Oral Q6H PRN Sharma Covert, MD      . alum & mag hydroxide-simeth  (MAALOX/MYLANTA) 200-200-20 MG/5ML suspension 30 mL  30 mL Oral Q4H PRN Sharma Covert, MD      . amLODipine (NORVASC) tablet 5 mg  5 mg Oral Daily Sharma Covert, MD   5 mg at 10/27/20 3419  . benztropine (COGENTIN) tablet 1 mg  1 mg Oral BID Sharma Covert, MD   1 mg at 10/27/20 0816  . haloperidol (HALDOL) tablet 10 mg  10 mg Oral Q6H PRN Sharma Covert, MD   10 mg at  10/26/20 1741   And  . benztropine (COGENTIN) tablet 1 mg  1 mg Oral BID PRN Sharma Covert, MD      . carbamazepine (TEGRETOL) chewable tablet 200 mg  200 mg Oral QHS Sharma Covert, MD   200 mg at 10/26/20 2036  . carvedilol (COREG) tablet 25 mg  25 mg Oral BID WC Sharma Covert, MD   25 mg at 10/27/20 5038  . haloperidol (HALDOL) tablet 10 mg  10 mg Oral Daily Sharma Covert, MD   10 mg at 10/27/20 0815   Or  . haloperidol lactate (HALDOL) injection 10 mg  10 mg Intramuscular Daily Sharma Covert, MD      . haloperidol (HALDOL) tablet 15 mg  15 mg Oral QHS Sharma Covert, MD      . hydrOXYzine (ATARAX/VISTARIL) tablet 25 mg  25 mg Oral TID PRN Sharma Covert, MD   25 mg at 10/27/20 0815  . magnesium hydroxide (MILK OF MAGNESIA) suspension 30 mL  30 mL Oral Daily PRN Sharma Covert, MD      . ziprasidone (GEODON) injection 20 mg  20 mg Intramuscular Q6H PRN Sharma Covert, MD   20 mg at 10/27/20 8828    Lab Results:  Results for orders placed or performed during the hospital encounter of 10/26/20 (from the past 48 hour(s))  Carbamazepine level, total     Status: Abnormal   Collection Time: 10/26/20  6:05 PM  Result Value Ref Range   Carbamazepine Lvl <2.0 (L) 4.0 - 12.0 ug/mL    Comment: Performed at [facility], [place] [address] [place], [place] [address]  Hemoglobin A1c     Status: Abnormal   Collection Time: 10/26/20  6:05 PM  Result Value Ref Range   Hgb A1c MFr Bld 6.0 (H) 4.8 - 5.6 %    Comment: (NOTE) Pre diabetes:          5.7%-6.4%  Diabetes:               >6.4%  Glycemic control for   <7.0% adults with diabetes    Mean Plasma Glucose 125.5 mg/dL    Comment: Performed at [place] [address] [place], [place] [address]  Lipid panel     Status: Abnormal   Collection Time: 10/26/20  6:05 PM  Result Value Ref Range   Cholesterol 173 0 - 200 mg/dL   Triglycerides 158 (H) <150 mg/dL   HDL 46 >40 mg/dL   Total CHOL/HDL Ratio 3.8 RATIO   VLDL 32 0 - 40 mg/dL   LDL Cholesterol 95 0 - 99 mg/dL    Comment:        Total Cholesterol/HDL:CHD Risk Coronary Heart Disease Risk Table                     Men   Women  1/2 Average Risk   3.4   3.3  Average Risk       5.0   4.4  2 X Average Risk   9.6   7.1  3 X Average Risk  23.4   11.0        Use the calculated Patient Ratio above and the CHD Risk Table to determine the patient's CHD Risk.        ATP III CLASSIFICATION (LDL):  <100     mg/dL   Optimal  100-129  mg/dL   Near or Above  Optimal  130-159  mg/dL   Borderline  160-189  mg/dL   High  >190     mg/dL   Very High Performed at [place] [address] [place], [place] [address]   TSH     Status: Abnormal   Collection Time: 10/26/20  6:05 PM  Result Value Ref Range   TSH 0.023 (L) 0.350 - 4.500 uIU/mL    Comment: Performed by a 3rd Generation assay with a functional sensitivity of <=0.01 uIU/mL. Performed at [facility], [place] [address] [place], [place] [address]     Blood Alcohol level:  Lab Results  Component Value Date   ETH <10 10/25/2020   ETH <10 37/62/8315    Metabolic Disorder Labs: Lab Results  Component Value Date   HGBA1C 6.0 (H) 10/26/2020   MPG 125.5 10/26/2020   MPG 111.15 02/08/2020   Lab Results  Component Value Date   PROLACTIN 11.0 03/14/2019   Lab Results  Component Value Date   CHOL 173 10/26/2020   TRIG 158 (H) 10/26/2020   HDL 46 10/26/2020   CHOLHDL 3.8 10/26/2020   VLDL 32 10/26/2020   LDLCALC 95 10/26/2020    LDLCALC 76 02/08/2020    Physical Findings: AIMS:  , ,  ,  ,    CIWA:    COWS:     Musculoskeletal: Strength & Muscle Tone: within normal limits Gait & Station: normal Patient leans: N/A  Psychiatric Specialty Exam: Physical Exam Vitals and nursing note reviewed.  HENT:     Head: Normocephalic and atraumatic.  Pulmonary:     Effort: Pulmonary effort is normal.  Neurological:     General: No focal deficit present.     Mental Status: She is alert.     Review of Systems  Blood pressure 137/86, pulse 93, temperature 98 F (36.7 C), temperature source Oral, resp. rate 20, height _0  (1.549 m), weight 67.1 kg.Body mass index is 27.96 kg/m.  General Appearance: Disheveled  Eye Contact:  Minimal  Speech:  Pressured  Volume:  Increased  Mood:  Anxious and Dysphoric  Affect:  Labile  Thought Process:  Disorganized and Descriptions of Associations: Tangential  Orientation:  Negative  Thought Content:  Delusions, Paranoid Ideation, Rumination and Tangential  Suicidal Thoughts:  No  Homicidal Thoughts:  No  Memory:  Immediate;   Poor Recent;   Poor Remote;   Poor  Judgement:  Impaired  Insight:  Lacking  Psychomotor Activity:  Increased  Concentration:  Concentration: Poor and Attention Span: Poor  Recall:  Poor  Fund of Knowledge:  Poor  Language:  Fair  Akathisia:  Negative  Handed:  Right  AIMS (if indicated):     Assets:  Desire for Improvement Resilience  ADL's:  Impaired  Cognition:  WNL  Sleep:        Treatment Plan Summary: Daily contact with patient to assess and evaluate symptoms and progress in treatment, Medication management and Plan : Patient is seen and examined.  Patient is a 54 year old female with the above-stated past psychiatric history who is seen in follow-up.   Diagnosis: 1.  Schizoaffective disorder; bipolar type 2.  Hypertension 3.  Hyperthyroidism 4.  Diabetes mellitus type 2  Pertinent findings on examination today: 1.  Patient  remains significantly disorganized, agitated and manic. 2.  TSH reveals significant hyperthyroidism. 3.  It appears this is a new diagnosis of type 2 diabetes as well. 4.  Still significantly poor sleep.  Plan: 1.  Increase Tegretol to 200 mg p.o. twice daily for mood stability. 2.  Increase haloperidol to 50 mg p.o. nightly for psychosis. 3.  Continue carvedilol 25 mg p.o. twice daily for hypertension and hyperthyroidism symptoms. 4.  Will order ultrasound of thyroid secondary to hyperthyroidism as well as previous thyroid nodules once she is more psychiatrically stable. 5.  Repeat T3 and free T4.  We will also obtain a TRAb/TSI/TBI. 6.  Continue amlodipine 5 mg p.o. daily for hypertension. 7.  Continue Cogentin 1 mg p.o. twice daily for side effects of medication. 8.  Disposition planning-in progress.  Sharma Covert, MD 10/27/2020, 11:46 AM

## 2020-10-27 NOTE — [facility] Group Notes (Signed)
LCSW Group Therapy Notes  Type of Therapy and Topic: Group Therapy: Healthy Vs. Unhealthy Coping Strategies  Date and Time: 1:00pm  Participation Level: [facility] PARTICIPATION LEVEL: None  Description of Group:  In this group, patients will be encouraged to explore their healthy and unhealthy coping strategics. Coping strategies are actions that we take to deal with stress, problems, or uncomfortable emotions in our daily lives. Each patient will be challenged to read some scenarios and discuss the unhealthy and healthy coping strategies within those scenarios. Also, each patient will be challenged to describe current healthy and unhealthy strategies that they use in their own lives and discuss the outcomes and barriers to those strategies. This group will be process-oriented, with patients participating in exploration of their own experiences as well as giving and receiving support and challenge from other group members.  Therapeutic Goals: 1. Patient will identify personal healthy and unhealthy coping strategies. 2. Patient will identify healthy and unhealthy coping strategies, in others, through scenarios.  3. Patient will identify expected outcomes of healthy and unhealthy coping strategies. 4. Patient will identify barriers to using healthy coping strategies.   Summary of Patient Progress: Patient was provided handouts.  Due to the COVID-19 pandemic and acuity of the unit, this group has been supplemented with worksheets.  Therapeutic Modalities:  Cognitive Behavioral Therapy Solution Focused Therapy Motivational Interviewing    Darletta Moll MSW, [place] Worker  [facility]

## 2020-10-27 NOTE — [facility] Group Notes (Signed)
Pt did not attend group this evening. Pt was not appropriate for group this evening.

## 2020-10-27 NOTE — Tx Team (Signed)
Interdisciplinary Treatment and Diagnostic Plan Update  10/27/2020 Time of Session: 9:05am Paula Dickson MRN: [id number]  Principal Diagnosis: <principal problem not specified>  Secondary Diagnoses: Active Problems:   Schizophrenia ([place])   Current Medications:  Current Facility-Administered Medications  Medication Dose Route Frequency Provider Last Rate Last Admin  . acetaminophen (TYLENOL) tablet 650 mg  650 mg Oral Q6H PRN Sharma Covert, MD      . alum & mag hydroxide-simeth (MAALOX/MYLANTA) 200-200-20 MG/5ML suspension 30 mL  30 mL Oral Q4H PRN Sharma Covert, MD      . amLODipine (NORVASC) tablet 5 mg  5 mg Oral Daily Sharma Covert, MD   5 mg at 10/27/20 6568  . benztropine (COGENTIN) tablet 1 mg  1 mg Oral BID Sharma Covert, MD   1 mg at 10/27/20 0816  . haloperidol (HALDOL) tablet 10 mg  10 mg Oral Q6H PRN Sharma Covert, MD   10 mg at 10/26/20 1741   And  . benztropine (COGENTIN) tablet 1 mg  1 mg Oral BID PRN Sharma Covert, MD      . carbamazepine (TEGRETOL) chewable tablet 200 mg  200 mg Oral QHS Sharma Covert, MD   200 mg at 10/26/20 2036  . carvedilol (COREG) tablet 25 mg  25 mg Oral BID WC Sharma Covert, MD   25 mg at 10/27/20 1275  . haloperidol (HALDOL) tablet 10 mg  10 mg Oral Daily Sharma Covert, MD   10 mg at 10/27/20 0815   Or  . haloperidol lactate (HALDOL) injection 10 mg  10 mg Intramuscular Daily Sharma Covert, MD      . haloperidol (HALDOL) tablet 15 mg  15 mg Oral QHS Sharma Covert, MD      . hydrOXYzine (ATARAX/VISTARIL) tablet 25 mg  25 mg Oral TID PRN Sharma Covert, MD   25 mg at 10/27/20 0815  . magnesium hydroxide (MILK OF MAGNESIA) suspension 30 mL  30 mL Oral Daily PRN Sharma Covert, MD      . ziprasidone (GEODON) injection 20 mg  20 mg Intramuscular Q6H PRN Sharma Covert, MD   20 mg at 10/27/20 0931   PTA Medications: Medications Prior to Admission  Medication Sig Dispense  Refill Last Dose  . amLODipine (NORVASC) 10 MG tablet Take 10 mg by mouth daily.   Past Week at Unknown time  . benztropine (COGENTIN) 1 MG tablet Take 1 tablet (1 mg total) by mouth 2 (two) times daily. For prevention of drug induced tremors 60 tablet 0   . carbamazepine (TEGRETOL) 100 MG chewable tablet Take 1 tablet (100 mg) by mouth in the morning & 2 tablets at bedtime: For mood stabilization 60 tablet 0   . carvedilol (COREG) 25 MG tablet Take 1 tablet (25 mg total) by mouth 2 (two) times daily with a meal. 60 tablet 2   . haloperidol (HALDOL) 10 MG tablet Take 1 tablet (10 mg total) by mouth at bedtime. For mood control 30 tablet 0   . haloperidol decanoate (HALDOL DECANOATE) 100 MG/ML injection Inject 1.5 mLs (150 mg total) into the muscle every 30 (thirty) days. (Due on 02/27/20): For mood control (Patient not taking: Reported on 10/26/2020) 1 mL 0 Not Taking  . traZODone (DESYREL) 50 MG tablet Take 1 tablet by mouth at bedtime as needed.       Patient Stressors: Financial difficulties Health problems Medication change or noncompliance  Patient Strengths: Active sense  of humor  Treatment Modalities: Medication Management, Group therapy, Case management,  1 to 1 session with clinician, Psychoeducation, Recreational therapy.   Physician Treatment Plan for Primary Diagnosis: <principal problem not specified> Long Term Goal(s): Improvement in symptoms so as ready for discharge Improvement in symptoms so as ready for discharge   Short Term Goals: Ability to identify changes in lifestyle to reduce recurrence of condition will improve Ability to verbalize feelings will improve Ability to demonstrate self-control will improve Ability to identify and develop effective coping behaviors will improve Ability to maintain clinical measurements within normal limits will improve Compliance with prescribed medications will improve Ability to identify changes in lifestyle to reduce recurrence of  condition will improve Ability to verbalize feelings will improve Ability to demonstrate self-control will improve Ability to identify and develop effective coping behaviors will improve Ability to maintain clinical measurements within normal limits will improve Compliance with prescribed medications will improve  Medication Management: Evaluate patient's response, side effects, and tolerance of medication regimen.  Therapeutic Interventions: 1 to 1 sessions, Unit Group sessions and Medication administration.  Evaluation of Outcomes: Not Met  Physician Treatment Plan for Secondary Diagnosis: Active Problems:   Schizophrenia ([place])  Long Term Goal(s): Improvement in symptoms so as ready for discharge Improvement in symptoms so as ready for discharge   Short Term Goals: Ability to identify changes in lifestyle to reduce recurrence of condition will improve Ability to verbalize feelings will improve Ability to demonstrate self-control will improve Ability to identify and develop effective coping behaviors will improve Ability to maintain clinical measurements within normal limits will improve Compliance with prescribed medications will improve Ability to identify changes in lifestyle to reduce recurrence of condition will improve Ability to verbalize feelings will improve Ability to demonstrate self-control will improve Ability to identify and develop effective coping behaviors will improve Ability to maintain clinical measurements within normal limits will improve Compliance with prescribed medications will improve     Medication Management: Evaluate patient's response, side effects, and tolerance of medication regimen.  Therapeutic Interventions: 1 to 1 sessions, Unit Group sessions and Medication administration.  Evaluation of Outcomes: Not Met   RN Treatment Plan for Primary Diagnosis: <principal problem not specified> Long Term Goal(s): Knowledge of disease and therapeutic  regimen to maintain health will improve  Short Term Goals: Ability to remain free from injury will improve, Ability to verbalize frustration and anger appropriately will improve, Ability to demonstrate self-control, Ability to participate in decision making will improve, Ability to verbalize feelings will improve, Ability to identify and develop effective coping behaviors will improve and Compliance with prescribed medications will improve  Medication Management: RN will administer medications as ordered by provider, will assess and evaluate patient's response and provide education to patient for prescribed medication. RN will report any adverse and/or side effects to prescribing provider.  Therapeutic Interventions: 1 on 1 counseling sessions, Psychoeducation, Medication administration, Evaluate responses to treatment, Monitor vital signs and CBGs as ordered, Perform/monitor CIWA, COWS, AIMS and Fall Risk screenings as ordered, Perform wound care treatments as ordered.  Evaluation of Outcomes: Not Met   LCSW Treatment Plan for Primary Diagnosis: <principal problem not specified> Long Term Goal(s): Safe transition to appropriate next level of care at discharge, Engage patient in therapeutic group addressing interpersonal concerns.  Short Term Goals: Engage patient in aftercare planning with referrals and resources, Increase social support, Increase ability to appropriately verbalize feelings, Increase emotional regulation, Identify triggers associated with mental health/substance abuse issues and Increase skills for  wellness and recovery  Therapeutic Interventions: Assess for all discharge needs, 1 to 1 time with Social worker, Explore available resources and support systems, Assess for adequacy in community support network, Educate family and significant other(s) on suicide prevention, Complete Psychosocial Assessment, Interpersonal group therapy.  Evaluation of Outcomes: Not Met   Progress in  Treatment: Attending groups: No. Participating in groups: No. Taking medication as prescribed: Yes. Toleration medication: Yes. Family/Significant other contact made: No, will contact:  if consent is provided Patient understands diagnosis: Yes. Discussing patient identified problems/goals with staff: Yes. Medical problems stabilized or resolved: Yes. Denies suicidal/homicidal ideation: Yes. Issues/concerns per patient self-inventory: No.   New problem(s) identified: No, Describe:  none  New Short Term/Long Term Goal(s): medication stabilization, elimination of SI thoughts, development of comprehensive mental wellness plan.   Patient Goals:  "To move forward"  Discharge Plan or Barriers: Patient recently admitted. CSW will continue to follow and assess for appropriate referrals and possible discharge planning.   Reason for Continuation of Hospitalization: Medication stabilization Other; describe agitation, insomnia, anhedonia   Estimated Length of Stay: 3-5 days  Attendees: Patient: Paula Dickson 10/27/2020   Physician: Myles Lipps, MD 10/27/2020   Nursing:  10/27/2020   RN Care Manager: 10/27/2020  Social Worker: Darletta Moll, LCSW 10/27/2020   Recreational Therapist:  10/27/2020   Other:  10/27/2020  Other:  10/27/2020   Other: 10/27/2020       Scribe for Treatment Team: Vassie Moselle, LCSW 10/27/2020 11:36 AM

## 2020-10-27 NOTE — [facility] Group Notes (Signed)
The focus of this group is to help patients establish daily goals to achieve during treatment and discuss how the patient can incorporate goal setting into their daily lives to aide in recovery.   With staff assistance pt developed her goal. Her goal is to"have a productive day and hone in on what the real issues are."

## 2020-10-27 NOTE — Progress Notes (Addendum)
   10/27/20 2100  Psych Admission Type (Psych Patients Only)  Admission Status Voluntary  Psychosocial Assessment  Patient Complaints Crying spells;Other (Comment) (disorganized thinking)  Eye Contact Glaring;Intense  Facial Expression Animated;Anxious  Affect Preoccupied  Speech Rapid;Pressured;Tangential  Interaction Intrusive;Childlike;Assertive  Motor Activity Shuffling;Slow;Tremors (tremors in hands)  Appearance/Hygiene Disheveled;In hospital gown  Behavior Characteristics Cooperative;Anxious;Restless  Mood Anxious;Preoccupied  Thought Process  Coherency Disorganized  Content Religiosity;Preoccupation  Delusions Religious  Perception UTA  Hallucination UTA  Judgment Impaired  Confusion Mild  Danger to Self  Current suicidal ideation? Denies  Danger to Others  Danger to Others None reported or observed   Pt denies SI, HI and pain. Pt states that she sees things but in her imagination. When asked if she thinks they are real, pt unable to provide an answer. Pt can be heard yelling out. Speech is rapid, pressured and garbled at times. Pt redirectable. Very impulsive and intrusive.

## 2020-10-27 NOTE — Progress Notes (Signed)
Patient remains disorganized and requires frequent redirection to stay  on task. Geodon IM PRN given x2 on shift due to patient disrobing, wandering the hallway inappropriately and she keeps trying to push on the unit doors. Haldol PRN just given patient unable to have logical conversation, writer will encourage safe behaviors and adhere to safety measures.

## 2020-10-28 ENCOUNTER — Ambulatory Visit (HOSPITAL_COMMUNITY)
Admission: RE | Admit: 2020-10-28 | Discharge: 2020-10-28 | Disposition: A | Discharge status: Home / Self Care | Payer: Medicare Other | Source: Ambulatory Visit | Attending: Psychiatry | Admitting: Psychiatry

## 2020-10-28 DIAGNOSIS — F250 Schizoaffective disorder, bipolar type: Secondary | ICD-10-CM | POA: Diagnosis not present

## 2020-10-28 DIAGNOSIS — E042 Nontoxic multinodular goiter: Secondary | ICD-10-CM | POA: Insufficient documentation

## 2020-10-28 LAB — URINALYSIS, COMPLETE (UACMP) WITH MICROSCOPIC
Bilirubin Urine: NEGATIVE
Glucose, UA: NEGATIVE mg/dL
Hgb urine dipstick: NEGATIVE
Ketones, ur: 5 mg/dL — AB
Leukocytes,Ua: NEGATIVE
Nitrite: NEGATIVE
Protein, ur: NEGATIVE mg/dL
Specific Gravity, Urine: 1.009 (ref 1.005–1.030)
pH: 6 (ref 5.0–8.0)

## 2020-10-28 LAB — T3: T3, Total: 191 ng/dL — ABNORMAL HIGH (ref 71–180)

## 2020-10-28 LAB — THYROID STIMULATING IMMUNOGLOBULIN: Thyroid Stimulating Immunoglob: <0.1 IU/L (ref 0.00–0.55)

## 2020-10-28 MED ORDER — CARBAMAZEPINE 100 MG PO CHEW
200.0000 mg | CHEWABLE_TABLET | Freq: Three times a day (TID) | ORAL | Status: DC
  Administered 2020-10-28 – 2020-11-02 (×17): 200 mg via ORAL
  Filled 2020-10-28 (×22): qty 2

## 2020-10-28 MED ORDER — HALOPERIDOL 5 MG PO TABS
20.0000 mg | ORAL_TABLET | Freq: Every day | ORAL | Status: DC
  Administered 2020-10-28: 20 mg via ORAL
  Filled 2020-10-28 (×2): qty 4

## 2020-10-28 MED ORDER — BENZTROPINE MESYLATE 0.5 MG PO TABS
0.5000 mg | ORAL_TABLET | Freq: Two times a day (BID) | ORAL | Status: DC
  Administered 2020-10-28 – 2020-10-30 (×4): 0.5 mg via ORAL
  Filled 2020-10-28 (×8): qty 1

## 2020-10-28 MED ORDER — HALOPERIDOL 5 MG PO TABS
10.0000 mg | ORAL_TABLET | Freq: Every day | ORAL | Status: DC
  Administered 2020-10-28 – 2020-10-30 (×3): 10 mg via ORAL
  Filled 2020-10-28 (×6): qty 2

## 2020-10-28 NOTE — Plan of Care (Signed)
  Problem: Education: Goal: Knowledge of [place] [facility] Education information/materials will improve Outcome: Progressing Goal: Emotional status will improve Outcome: Progressing Goal: Mental status will improve Outcome: Progressing Goal: Verbalization of understanding the information provided will improve Outcome: Progressing   

## 2020-10-28 NOTE — Progress Notes (Signed)
Nursing 1:1 Note  Pt continues on 1:1 for safety because she is still disorganized, confused, intrusive, and needs frequent redirections. Pt is currently in the dayroom with her sitter within arms reach. Pt's gait remains unsteady and she shuffles. She continues to be on high fall risk prevention protocol. Pt's speech is slurred and is hard to comprehend. Pt appears to still be hyper religious and mentions a river that she needs to go to. She tries to wander and cooperates with redirections. Q 15 min safety checks continue. Pt's safety has been maintained.

## 2020-10-28 NOTE — Progress Notes (Signed)
   10/28/20 2049  COVID-19 Daily Checkoff  Have you had a fever (temp > 37.80C/100F)  in the past 24 hours?  No  COVID-19 EXPOSURE  Have you traveled outside the state in the past 14 days? No  Have you been in contact with someone with a confirmed diagnosis of COVID-19 or PUI in the past 14 days without wearing appropriate PPE? No  Have you been living in the same home as a person with confirmed diagnosis of COVID-19 or a PUI (household contact)? No  Have you been diagnosed with COVID-19? No

## 2020-10-28 NOTE — Progress Notes (Signed)
   10/28/20 2049  Psych Admission Type (Psych Patients Only)  Admission Status Voluntary  Psychosocial Assessment  Patient Complaints Anxiety;Confusion;Restlessness  Eye Contact Intense  Facial Expression Animated;Anxious  Affect Anxious;Preoccupied  Speech Rapid;Pressured;Slurred;Tangential  Interaction Intrusive;Assertive  Motor Activity Fidgety;Restless;Shuffling  Appearance/Hygiene Disheveled;Poor hygiene  Behavior Characteristics Cooperative;Fidgety;Anxious;Restless  Mood Anxious;Preoccupied;Pleasant  Thought Process  Coherency Disorganized;Tangential  Content Preoccupation;Religiosity  Delusions Religious  Perception WDL  Hallucination None reported or observed  Judgment Impaired  Confusion Mild  Danger to Self  Current suicidal ideation? Denies  Danger to Others  Danger to Others None reported or observed

## 2020-10-28 NOTE — Progress Notes (Signed)
Nursing Close observation note D:Pt observed lying in bed awake. RR even and unlabored. No distress noted. A: Close observation continues for safety  R: Pt remains safe

## 2020-10-28 NOTE — Progress Notes (Signed)
  Nursing 1:1 Note: D: Patient in room sleeping in bed. MHT with pt.  A:  encouragement provided Routine safety checks conducted every 15 minutes. R:  Safety maintained.

## 2020-10-28 NOTE — Progress Notes (Signed)
1:1 note  Pt awoke after last encounter and still seemed intrusive, impulsive, and was trying to wander. Pt was pleasant though, laughing and giggling. Pt compliant with medication administration. Pt was compliant with going for labs at [facility]. Pt arrived back and was still laughing and impulsive but redirectable. Pt with sitter now eating dinner in their room. No signs of distress noted. q45m safety checks implemented and continued. 1:1 continues for safety, confusion, and boundary violations. Pt safe on the unit. Will continue to monitor.

## 2020-10-28 NOTE — [facility] Group Notes (Signed)
The focus of this group is to help patients establish daily goals to achieve during treatment and discuss how the patient can incorporate goal setting into their daily lives to aide in recovery.  Pt did not attend group 

## 2020-10-28 NOTE — [facility] Counselor (Signed)
CSW attempted to complete PSA with pt but she was sleeping.   Toney Reil, [place] Worker [employer]

## 2020-10-28 NOTE — Progress Notes (Signed)
Nursing 1:1 note D:Pt observed in the hallway walking towards the dayroom. RR even and unlabored. No distress noted. A: 1:1 observation continues for safety  R: pt remains safe

## 2020-10-28 NOTE — Progress Notes (Signed)
1:1 note  Pt found in bed. Pt has been resting since shift change. No signs of distress noted with equal and unlabored chest expansion. Sitter within arms reach of pt. Pt safe on the unit. q14m safety checks implemented and continued. Will continue to monitor. 1:1 continues for safety.

## 2020-10-28 NOTE — Progress Notes (Signed)
[facility] MD Progress Note  10/28/2020 9:37 AM Paula Dickson  MRN:  [id number] Subjective:  Patient is a 54 year old female with a reported past psychiatric history significant for schizoaffective disorder; bipolar type who originally presented to the [facility] emergency department on 10/25/2020 after her family members had brought her to the hospital because of worsening manic symptoms.  Objective: Patient is seen and examined.  Patient is a 54 year old female with the above-stated past psychiatric history who is seen in follow-up.  She still remains quite agitated.  She still not sleeping a great deal.  She remains disorganized.  She still intrusive.  Please see note from yesterday regarding her TSH and other thyroid function studies.  I discussed with nursing this morning about the possibility of obtaining the thyroid ultrasound either today or tomorrow if the patient would be appropriate to transport over there, and attempt to get done prior to the weekend.  Her vital signs are stable, she is afebrile.  She again did not sleep last night.  Her TSI was ordered yesterday, and still pending.  Her T3 was 191.  Which is consistent with hypothyroidism.  Principal Problem: <principal problem not specified> Diagnosis: Active Problems:   Schizophrenia ([place])  Total Time spent with patient: 20 minutes  Past Psychiatric History: See admission H&P  Past Medical History:  Past Medical History:  Diagnosis Date  . Bipolar affective disorder ([place])   . History of arthritis   . History of chicken pox   . History of depression   . History of genital warts   . history of heart murmur   . History of high blood pressure   . History of thyroid disease   . History of UTI   . Hypertension   . Low TSH level 07/13/2017  . Schizophrenia [facility])     Past Surgical History:  Procedure Laterality Date  . ABLATION ON ENDOMETRIOSIS    . CYST REMOVAL NECK     around 11 years ago /benign  .  MULTIPLE TOOTH EXTRACTIONS     Family History:  Family History  Problem Relation Age of Onset  . Arthritis Father   . Hyperlipidemia Father   . High blood pressure Father   . Diabetes Sister   . Diabetes Mother   . Diabetes Brother   . Mental illness Brother   . Alcohol abuse Paternal Uncle   . Alcohol abuse Paternal Grandfather   . Breast cancer Maternal Aunt   . Breast cancer Paternal Aunt   . High blood pressure Sister   . Mental illness Other        runs in family   Family Psychiatric  History: See admission H&P Social History:  Social History   Substance and Sexual Activity  Alcohol Use No     Social History   Substance and Sexual Activity  Drug Use No    Social History   Socioeconomic History  . Marital status: Single    Spouse name: Not on file  . Number of children: Not on file  . Years of education: Not on file  . Highest education level: Patient refused  Occupational History  . Not on file  Tobacco Use  . Smoking status: Never Smoker  . Smokeless tobacco: Never Used  Vaping Use  . Vaping Use: Never used  Substance and Sexual Activity  . Alcohol use: No  . Drug use: No  . Sexual activity: Not Currently  Other Topics Concern  . Not on file  Social History Narrative  . Not on file   Social Determinants of Health   Financial Resource Strain:   . Difficulty of Paying Living Expenses: Not on file  Food Insecurity:   . Worried About Charity fundraiser in the Last Year: Not on file  . Ran Out of Food in the Last Year: Not on file  Transportation Needs:   . Lack of Transportation (Medical): Not on file  . Lack of Transportation (Non-Medical): Not on file  Physical Activity:   . Days of Exercise per Week: Not on file  . Minutes of Exercise per Session: Not on file  Stress:   . Feeling of Stress : Not on file  Social Connections:   . Frequency of Communication with Friends and Family: Not on file  . Frequency of Social Gatherings with Friends  and Family: Not on file  . Attends Religious Services: Not on file  . Active Member of Clubs or Organizations: Not on file  . Attends [name] Meetings: Not on file  . Marital Status: Not on file   Additional Social History:                         Sleep: Poor  Appetite:  Fair  Current Medications: Current Facility-Administered Medications  Medication Dose Route Frequency Provider Last Rate Last Admin  . acetaminophen (TYLENOL) tablet 650 mg  650 mg Oral Q6H PRN Sharma Covert, MD   650 mg at 10/27/20 1418  . alum & mag hydroxide-simeth (MAALOX/MYLANTA) 200-200-20 MG/5ML suspension 30 mL  30 mL Oral Q4H PRN Sharma Covert, MD      . amLODipine (NORVASC) tablet 5 mg  5 mg Oral Daily Sharma Covert, MD   5 mg at 10/28/20 0758  . benztropine (COGENTIN) tablet 1 mg  1 mg Oral BID Sharma Covert, MD   1 mg at 10/28/20 0759  . haloperidol (HALDOL) tablet 10 mg  10 mg Oral Q6H PRN Sharma Covert, MD   10 mg at 10/28/20 0320   And  . benztropine (COGENTIN) tablet 1 mg  1 mg Oral BID PRN Sharma Covert, MD   1 mg at 10/28/20 0320  . carbamazepine (TEGRETOL) chewable tablet 200 mg  200 mg Oral TID Sharma Covert, MD   200 mg at 10/28/20 0759  . carvedilol (COREG) tablet 25 mg  25 mg Oral BID WC Sharma Covert, MD   25 mg at 10/28/20 0758  . haloperidol (HALDOL) tablet 10 mg  10 mg Oral Daily Sharma Covert, MD   10 mg at 10/28/20 2956   Or  . haloperidol lactate (HALDOL) injection 10 mg  10 mg Intramuscular Daily Sharma Covert, MD      . haloperidol (HALDOL) tablet 10 mg  10 mg Oral Daily Sharma Covert, MD   10 mg at 10/28/20 0800  . haloperidol (HALDOL) tablet 20 mg  20 mg Oral QHS Sharma Covert, MD      . hydrOXYzine (ATARAX/VISTARIL) tablet 25 mg  25 mg Oral TID PRN Sharma Covert, MD   25 mg at 10/28/20 0758  . magnesium hydroxide (MILK OF MAGNESIA) suspension 30 mL  30 mL Oral Daily PRN Sharma Covert, MD       . ziprasidone (GEODON) injection 20 mg  20 mg Intramuscular Q6H PRN Sharma Covert, MD   20 mg at 10/27/20 0931    Lab Results:  Results for orders placed or performed during the hospital encounter of 10/26/20 (from the past 48 hour(s))  Carbamazepine level, total     Status: Abnormal   Collection Time: 10/26/20  6:05 PM  Result Value Ref Range   Carbamazepine Lvl <2.0 (L) 4.0 - 12.0 ug/mL    Comment: Performed at [facility], [address] [place], [place] [address]  Hemoglobin A1c     Status: Abnormal   Collection Time: 10/26/20  6:05 PM  Result Value Ref Range   Hgb A1c MFr Bld 6.0 (H) 4.8 - 5.6 %    Comment: (NOTE) Pre diabetes:          5.7%-6.4%  Diabetes:              >6.4%  Glycemic control for   <7.0% adults with diabetes    Mean Plasma Glucose 125.5 mg/dL    Comment: Performed at [facility] [address] [place], [place] [address]  Lipid panel     Status: Abnormal   Collection Time: 10/26/20  6:05 PM  Result Value Ref Range   Cholesterol 173 0 - 200 mg/dL   Triglycerides 158 (H) <150 mg/dL   HDL 46 >40 mg/dL   Total CHOL/HDL Ratio 3.8 RATIO   VLDL 32 0 - 40 mg/dL   LDL Cholesterol 95 0 - 99 mg/dL    Comment:        Total Cholesterol/HDL:CHD Risk Coronary Heart Disease Risk Table                     Men   Women  1/2 Average Risk   3.4   3.3  Average Risk       5.0   4.4  2 X Average Risk   9.6   7.1  3 X Average Risk  23.4   11.0        Use the calculated Patient Ratio above and the CHD Risk Table to determine the patient's CHD Risk.        ATP III CLASSIFICATION (LDL):  <100     mg/dL   Optimal  100-129  mg/dL   Near or Above                    Optimal  130-159  mg/dL   Borderline  160-189  mg/dL   High  >190     mg/dL   Very High Performed at [facility] [address], [place], [place] [address]   TSH     Status: Abnormal   Collection Time: 10/26/20  6:05 PM  Result Value Ref Range   TSH 0.023 (L)  0.350 - 4.500 uIU/mL    Comment: Performed by a 3rd Generation assay with a functional sensitivity of <=0.01 uIU/mL. Performed at [facility], [place] [address] [place], [place] [address]   T3     Status: Abnormal   Collection Time: 10/27/20  6:30 PM  Result Value Ref Range   T3, Total 191 (H) 71 - 180 ng/dL    Comment: (NOTE) Performed At: [facility] [place], [place] [id number] Rush Farmer MD [id number]   T4, free     Status: Abnormal   Collection Time: 10/27/20  6:30 PM  Result Value Ref Range   Free T4 1.22 (H) 0.61 - 1.12 ng/dL    Comment: (NOTE) Biotin ingestion may interfere with free T4 tests. If the results are inconsistent  with the TSH level, previous test results, or the clinical presentation, then consider biotin interference. If needed, order repeat testing after stopping biotin. Performed at [facility], [place] [address], [place], [place] [address]     Blood Alcohol level:  Lab Results  Component Value Date   [facility] <10 10/25/2020   ETH <10 11/94/1740    Metabolic Disorder Labs: Lab Results  Component Value Date   HGBA1C 6.0 (H) 10/26/2020   MPG 125.5 10/26/2020   MPG 111.15 02/08/2020   Lab Results  Component Value Date   PROLACTIN 11.0 03/14/2019   Lab Results  Component Value Date   CHOL 173 10/26/2020   TRIG 158 (H) 10/26/2020   HDL 46 10/26/2020   CHOLHDL 3.8 10/26/2020   VLDL 32 10/26/2020   LDLCALC 95 10/26/2020   LDLCALC 76 02/08/2020    Physical Findings: AIMS:  , ,  ,  ,    CIWA:    COWS:     Musculoskeletal: Strength & Muscle Tone: within normal limits Gait & Station: shuffle Patient leans: N/A  Psychiatric Specialty Exam: Physical Exam Vitals and nursing note reviewed.  HENT:     Head: Normocephalic and atraumatic.  Pulmonary:     Effort: Pulmonary effort is normal.  Neurological:     General: No focal deficit present.     Mental Status: She is alert.     Review of  Systems  Blood pressure 126/84, pulse 82, temperature 98.4 F (36.9 C), temperature source Oral, resp. rate 20, height 5\' 1"  (1.549 m), weight 67.1 kg.Body mass index is 27.96 kg/m.  General Appearance: Disheveled  Eye Contact:  Fair  Speech:  Pressured  Volume:  Normal  Mood:  Anxious and Dysphoric  Affect:  Labile  Thought Process:  Disorganized and Descriptions of Associations: Tangential  Orientation:  Negative  Thought Content:  Delusions, Paranoid Ideation, Rumination and Tangential  Suicidal Thoughts:  No  Homicidal Thoughts:  No  Memory:  Immediate;   Poor Recent;   Poor Remote;   Poor  Judgement:  Impaired  Insight:  Lacking  Psychomotor Activity:  Increased  Concentration:  Concentration: Poor and Attention Span: Poor  Recall:  Poor  Fund of Knowledge:  Poor  Language:  Fair  Akathisia:  Negative  Handed:  Right  AIMS (if indicated):     Assets:  Desire for Improvement Resilience  ADL's:  Impaired  Cognition:  WNL  Sleep:  Number of Hours: 0     Treatment Plan Summary: Daily contact with patient to assess and evaluate symptoms and progress in treatment, Medication management and Plan : Patient is seen and examined.  Patient is a 54 year old female with the above-stated past psychiatric history who is seen in follow-up.   Diagnosis: 1.  Schizoaffective disorder; bipolar type 2.  Hypertension 3.  Hyperthyroidism 4.  Diabetes mellitus type 2  Pertinent findings on examination today: 1.  Patient remains significantly disorganized, agitated and manic. 2.  TSH, T4 and T3 reuptake confirms hyperthyroidism. 3.  With her new diagnosis of diabetes we should be doing daily blood sugars and I will order those. 4.  Patient still not sleeping. 5.  Ultrasound of thyroid given the history of nodules has been ordered for when the patient is more stable. 6.  Consideration of changing antipsychotic medications if increased in haloperidol today is ineffective in improving her  situation.  Plan: 1.  Continue amlodipine 5 mg p.o. daily for hypertension. 2.  Decrease Cogentin to 0.5 mg  p.o. twice daily for side effects of medications.  Cogentin can cause confusion but usually in older patients. 3.  Increase Tegretol to 200 mg p.o. 3 times daily for agitation and mania. 4.  Continue carvedilol 25 mg p.o. twice daily for hypertension as well as possible tachycardia from hyperthyroidism. 5.  Increase haloperidol to 10 mg p.o. daily and 20 mg p.o. nightly for psychosis. 6.  If patient shows no improvement with increase Haldol dosage today we will consider switching antipsychotic medications. 7.  Continue agitation protocol with haloperidol. 8.  We do need to get a urinalysis and an EKG on her.  If she is calm enough today hopefully that can be obtained. 9.  Ultrasound of thyroid given multiple thyroid nodules and hyperthyroidism to reassess compared to previous. 10.  Continue Geodon 20 mg IM every 6 hours as needed agitation. 11.  Disposition planning-in progress.  Sharma Covert, MD 10/28/2020, 9:37 AM

## 2020-10-28 NOTE — Progress Notes (Signed)
Nursing 1:1 Note D: Patient with tech, giggling, taking meds at med window, walking away and returning. Pt. Was directed to take medicine. Talking nonsensical. A: 1:1 continues for safety R: Safety maintained

## 2020-10-28 NOTE — Progress Notes (Signed)
1:1 Note  Pt is currently resting in bed with her eyes closed. She appears to be sleeping. Her respirations are even and unlabored. No distress has been observed. Sitter remains within arms reach. 1:1 continues for safety because pt remains disorganized. Q 15 min safety checks continue. Pt's safety has been maintained.

## 2020-10-28 NOTE — BHH Group Notes (Signed)
Occupational Therapy Group Note Date: 10/28/2020 Group Topic/Focus: Brain Fitness  Group Description: Group encouraged increased social engagement and participation through discussion/activity focused on brain fitness. Patients were provided education on various brain fitness activities/strategies, with explanation provided on the qualifying factors including: one, that is has to be challenging/hard and two, it has to be something that you do not do every day. Patients engaged actively during group session in various brain fitness activities to increase attention, concentration, and problem-solving skills. Discussion followed with a focus on identifying the benefits of brain fitness activities as use for adaptive coping strategies and distraction.    Therapeutic Goal(s): Identify benefit(s) of brain fitness activities as use for adaptive coping and healthy distraction. Identify specific brain fitness activities to engage in as use for adaptive coping and healthy distraction. Individualization: Pt was excused from group per 1:1 MHT that was sitting with patient  Modes of Intervention: Activity  Patient Response to Interventions:  Did not attend   Plan: Continue to engage patient in OT groups 2 - 3x/week.  10/28/2020  Ponciano Ort, MOT, OTR/L

## 2020-10-28 NOTE — Progress Notes (Signed)
Pt continues to move about the room with pressured and rapid speech. Delusions are religious in nature. Pt recounting Bible verses and states that she believes she is the "[place] and that she must go meet her twin on the "[place]." Pt lays down for a few minutes at a time. Pt constantly trying to leave the room and roam the hall. Pt given Haldol 10 mg PO and Cogentin 1 mg PO. Pt does have some tremors in her left hand. Will continue to monitor.

## 2020-10-28 NOTE — Progress Notes (Signed)
Nursing 1:1 close observation note D:Pt observed sleeping in bed with eyes closed. RR even and unlabored. No distress noted.  A: 1:1 close observation continues for safety  R: pt remains safe

## 2020-10-28 NOTE — Progress Notes (Signed)
Patient rated her day as a 6 out of a possible 10. Patient states that she feels the same. Patient's speech is pressured and can be difficult to comprehend. Her goal for tomorrow is to get discharged.

## 2020-10-29 DIAGNOSIS — F250 Schizoaffective disorder, bipolar type: Secondary | ICD-10-CM | POA: Diagnosis not present

## 2020-10-29 MED ORDER — INFLUENZA VAC SPLIT QUAD 0.5 ML IM SUSY
0.5000 mL | PREFILLED_SYRINGE | INTRAMUSCULAR | Status: AC
  Administered 2020-10-31: 0.5 mL via INTRAMUSCULAR
  Filled 2020-10-29: qty 0.5

## 2020-10-29 MED ORDER — OLANZAPINE 5 MG PO TBDP
15.0000 mg | ORAL_TABLET | Freq: Every day | ORAL | Status: DC
  Administered 2020-10-29: 15 mg via ORAL
  Filled 2020-10-29 (×4): qty 3

## 2020-10-29 MED ORDER — HALOPERIDOL 5 MG PO TABS
10.0000 mg | ORAL_TABLET | Freq: Every day | ORAL | Status: DC
  Administered 2020-10-29 – 2020-10-30 (×2): 10 mg via ORAL
  Filled 2020-10-29 (×4): qty 2

## 2020-10-29 MED ORDER — OLANZAPINE 7.5 MG PO TABS
15.0000 mg | ORAL_TABLET | Freq: Every day | ORAL | Status: DC
  Filled 2020-10-29: qty 2

## 2020-10-29 MED ORDER — OLANZAPINE 10 MG PO TBDP
10.0000 mg | ORAL_TABLET | Freq: Every day | ORAL | Status: DC
  Administered 2020-10-29 – 2020-10-31 (×3): 10 mg via ORAL
  Filled 2020-10-29 (×4): qty 1

## 2020-10-29 MED ORDER — OLANZAPINE 10 MG IM SOLR
10.0000 mg | Freq: Four times a day (QID) | INTRAMUSCULAR | Status: AC | PRN
  Administered 2020-10-30 – 2020-11-03 (×3): 10 mg via INTRAMUSCULAR
  Filled 2020-10-29 (×4): qty 10

## 2020-10-29 NOTE — Progress Notes (Signed)
The pt is observed in the hallway, pacing with the sitter present. She remains restless, fidgety and confused. She continues to require 1:1 observation for safety and redirecting for inappropriate behaviors on the unit.

## 2020-10-29 NOTE — Progress Notes (Signed)
1:1 Note: The pt is observed in the hallway, pacing with the sitter present. She is pleasant towards this Probation officer, but has been observed easily agitated with the sitter and sarcastic. She remains restless, fidgety and confused. She continues to require 1:1 observation for safety and redirecting for inappropriate behaviors on the unit.

## 2020-10-29 NOTE — BHH Group Notes (Signed)
LCSW Group Therapy Notes   Date and Time:   Type of Therapy and Topic: Group Therapy: Worry and Anxiety  Participation Level: BHH PARTICIPATION LEVEL: Minimal  Description of Group: In this group, patients will be encouraged to explore their worry around what could happen vs what will happen. Each patient will be challenged to think of personal worries and how they will work their way through that worry around what will happen and what could happen. This group will be process-oriented, with patients participating in exploration of their own experiences as well as giving and receiving support and challenge from other group members.  Therapeutic Goals: 1. Patient will identify personal worries that cause anxiety. 2. Patient will identify clues to identify their worry. 3. Patient will identify ways to handle their worry. 4. Patient will discuss ways that their worry has deceased or why it has not decreased.   Summary of Patient Progress: Patient came into group 40 minutes into the session, patient shared and left within 5 minutes.  Therapeutic Modalities:  Cognitive Behavioral Therapy Solution Focused Therapy Motivational Interviewing   Darletta Moll MSW, [place] Worker  [facility]

## 2020-10-29 NOTE — BHH Counselor (Signed)
Adult Comprehensive Assessment  Patient ID: Paula Dickson, female   DOB: 1965/11/30, 54 y.o.   MRN: [id number] Information Source: Information source: Patient, Chart Review  Current Stressors: Patient states their primary concerns and needs for treatment are:: "I got up and couldn't get back down" Patient states their goals for this hospitilization and ongoing recovery are:: UTA Educational / Learning stressors: None reported Employment / Job issues: None reported Family Relationships: Per previous assessment:"Patient reports that her dad died in May 01, 2019 and ever since the family has been trying to take things from the house like land and titles of carsPublishing copy / Lack of resources (include bankruptcy): Receives disability income Housing / Lack of housing:Patient reports that she lives alone since her father passed. Physical health (include injuries & life threatening diseases):Hypertension Social relationships: None reported Substance abuse: None reported Bereavement / Loss:Patient reports that her mother died in 02-28-2010 and her father died in 01-May-2019 from prostate cancer.  Living/Environment/Situation: Living Arrangements:Alone Living conditions (as described by patient or guardian):"I love every minute of it" Who else lives in the home?:Just the patient How long has patient lived in current situation?:Patient has always lived at home. What is atmosphere in current home:Comfortable  Family History: Marital status: Single Are you sexually active?: No What is your sexual orientation?: Heterosexual Has your sexual activity been affected by drugs, alcohol, medication, or emotional stress?: None reported Children: None  Childhood History: By whom was/is the patient raised?: Both parents Description of patient's relationship with caregiver when they were a child: Good relationship with parents  Patient's description of current relationship with  people who raised him/her:Patient's father and mother are now deceased. How were you disciplined when you got in trouble as a child/adolescent?: None reported  Does patient have siblings?: Yes Number of Siblings: 7 Description of patient's current relationship with siblings: 5 brothers, 2 sisters; patient is resentful of how much siblings are coming to the home and interfering with care for her father, telling her what to do Did patient suffer any verbal/emotional/physical/sexual abuse as a child?: No Has patient ever been sexually abused/assaulted/raped as an adolescent or adult?: No Was the patient ever a victim of a crime or a disaster?: No Witnessed domestic violence?: No Has patient been effected by domestic violence as an adult?: No  Education: 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 Currently a student?: No Learning disability?: No  Employment/Work Situation: Employment situation: On disability Why is patient on disability: Mental health How long has patient been on disability: 3 years Patient's job has been impacted by current illness: No What is the longest time patient has a held a job?: 9 years  Did You Receive Any Psychiatric Treatment/Services While in the [employer]?: (No Armed forces logistics/support/administrative officer) Are There Guns or Other Weapons in [place]?: No  Financial Resources: Financial resources: Teacher, early years/pre, Medicare Does patient have a Programmer, applications or guardian?: Yes Name of representative payee or guardian: Paula Dickson [phone]  Alcohol/Substance Abuse: What has been your use of drugs/alcohol within the last 12 months?: Denies Alcohol/Substance Abuse Treatment Hx: Denies past history Has alcohol/substance abuse ever caused legal problems?: No  Social Support System: Patient's Community Support System:Fair Describe Community Support System:Pt reports some friends and her siblings, at times."They're impressed with my  independence"  Type of faith/religion: Darrick Meigs How does patient's faith help to cope with current illness?: "Take it as it is"  Leisure/Recreation: Leisure and Hobbies: Development worker, community, Chiropractor, love to write"  Strengths/Needs:  What is the patient's perception of their strengths?: "Love one another" Patient states they can use these personal strengths during their treatment to contribute to their recovery: "God knows best" Patient states these barriers may affect/interfere with their treatment: None Patient states these barriers may affect their return to the community:N/A Other important information patient would like considered in planning for their treatment: None  Discharge Plan: Currently receiving community mental health services: Yes (From Whom)(States she gets services from [place]) Patient states concerns and preferences for aftercare planning ZDG:LOVFIEP reports wanting to continue with [facility] and stated that she goes to [facility] on [address] for her hypertension Patient states they will know when they are safe and ready for discharge when:UTA Does patient have access to transportation?: No Does patient have financial barriers related to discharge medications?: No Patient description of barriers related to discharge medications: Has disability income and Medicare Plan for no access to transportation at discharge:Patient states that she will find a way home. Plan for living situation after discharge:Pt will go back home Will patient be returning to same living situation after discharge?:Yes  Summary/Recommendations:   Summary and Recommendations (to be completed by the evaluator): Patient is a 54 year old female with a reported past psychiatric history significant for schizoaffective disorder; bipolar type who originally presented to the [facility] emergency department on 10/25/2020 after her family members had brought her to the hospital  because of worsening manic symptoms. Pt was pleasant during assessment and was able to answer questions appropriately, however would often begin laughing or talking gibberish and need to be redirected. Patient shares she lives alone and loves living alone. Patient shares she is able to be independent and take care of her needs on her own. She reported that she is followed by [facility] for medication management. Gari can benefit from crisis stabilization, medication management, therapeutic milieu and referral services.

## 2020-10-29 NOTE — B[facility] Counselor (Signed)
CSW attempted to get consent from pt for collateral contact. Pt stated that she did not have any friends or family. When CSW asked her to sign the consent pt stated "I can't, Jesus will be mad at me." and pushed away the consent form. CSW assured pt that this is okay and she would come back later.   Toney Reil, [place] Worker [employer]

## 2020-10-29 NOTE — Progress Notes (Signed)
[facility] MD Progress Note  10/29/2020 10:22 AM Paula Dickson  MRN:  [id number] Subjective:  Patient is a 54 year old female with a reported past psychiatric history significant for schizoaffective disorder; bipolar type who originally presented to the [facility] emergency department on 10/25/2020 after her family members had brought her to the hospital because of worsening manic symptoms.  Objective: Patient is seen and examined.  Patient is a 54 year old female with the above-stated past psychiatric history who is seen in follow-up.  She actually is doing slightly better today.  She is still very disorganized, and still with periods of agitation, but she is lying in bed and somewhat more appropriate today.  She was unsure what the date was, and I told her it was October 29, 2020.  She stated that her sister's birthday was [birth year].  She did admit to auditory hallucinations.  She still continues to have very poor sleep.  Nursing notes show that she slept only 3 hours last night.  Discussion yesterday was that if she did not sleep with the Haldol at 10 mg p.o. daily and 20 mg p.o. nightly we would consider changing medications.  We had continued down this path for now because her last 2 psychiatric hospitalizations improved with the Haldol at lower dosages that she is on now.  Her TSI did come back, and it was negative so this is not Graves' disease.  Her ultrasound of her thyroid revealed multinodular goiter.  Nodule 1 is the dominant right thyroid nodule in the right inferior thyroid lobe.  The nodule was not significantly changed since the last ultrasound.  The nodule was felt to meet criteria for ultrasound-guided biopsy.  Nodule to and for both meet criteria for 1 year follow-up.  Review of her laboratories showed no new results except for the TSI.  Her blood pressure this morning is 148/81 and repeat was 127/102.  Repeat at 938 was 116/71.  She is afebrile.  Principal Problem:  <principal problem not specified> Diagnosis: Active Problems:   Schizophrenia ([place])  Total Time spent with patient: 20 minutes  Past Psychiatric History: See admission H&P  Past Medical History:  Past Medical History:  Diagnosis Date  . Bipolar affective disorder ([place])   . History of arthritis   . History of chicken pox   . History of depression   . History of genital warts   . history of heart murmur   . History of high blood pressure   . History of thyroid disease   . History of UTI   . Hypertension   . Low TSH level 07/13/2017  . Schizophrenia [facility])     Past Surgical History:  Procedure Laterality Date  . ABLATION ON ENDOMETRIOSIS    . CYST REMOVAL NECK     around 11 years ago /benign  . MULTIPLE TOOTH EXTRACTIONS     Family History:  Family History  Problem Relation Age of Onset  . Arthritis Father   . Hyperlipidemia Father   . High blood pressure Father   . Diabetes Sister   . Diabetes Mother   . Diabetes Brother   . Mental illness Brother   . Alcohol abuse Paternal Uncle   . Alcohol abuse Paternal Grandfather   . Breast cancer Maternal Aunt   . Breast cancer Paternal Aunt   . High blood pressure Sister   . Mental illness Other        runs in family   Family Psychiatric  History: See admission  H&P Social History:  Social History   Substance and Sexual Activity  Alcohol Use No     Social History   Substance and Sexual Activity  Drug Use No    Social History   Socioeconomic History  . Marital status: Single    Spouse name: Not on file  . Number of children: Not on file  . Years of education: Not on file  . Highest education level: Patient refused  Occupational History  . Not on file  Tobacco Use  . Smoking status: Never Smoker  . Smokeless tobacco: Never Used  Vaping Use  . Vaping Use: Never used  Substance and Sexual Activity  . Alcohol use: No  . Drug use: No  . Sexual activity: Not Currently  Other Topics Concern  . Not on file   Social History Narrative  . Not on file   Social Determinants of Health   Financial Resource Strain:   . Difficulty of Paying Living Expenses: Not on file  Food Insecurity:   . Worried About Charity fundraiser in the Last Year: Not on file  . Ran Out of Food in the Last Year: Not on file  Transportation Needs:   . Lack of Transportation (Medical): Not on file  . Lack of Transportation (Non-Medical): Not on file  Physical Activity:   . Days of Exercise per Week: Not on file  . Minutes of Exercise per Session: Not on file  Stress:   . Feeling of Stress : Not on file  Social Connections:   . Frequency of Communication with Friends and Family: Not on file  . Frequency of Social Gatherings with Friends and Family: Not on file  . Attends Religious Services: Not on file  . Active Member of Clubs or Organizations: Not on file  . Attends [name] Meetings: Not on file  . Marital Status: Not on file   Additional Social History:                         Sleep: Poor  Appetite:  Fair  Current Medications: Current Facility-Administered Medications  Medication Dose Route Frequency Provider Last Rate Last Admin  . acetaminophen (TYLENOL) tablet 650 mg  650 mg Oral Q6H PRN Sharma Covert, MD   650 mg at 10/27/20 1418  . alum & mag hydroxide-simeth (MAALOX/MYLANTA) 200-200-20 MG/5ML suspension 30 mL  30 mL Oral Q4H PRN Sharma Covert, MD      . amLODipine (NORVASC) tablet 5 mg  5 mg Oral Daily Sharma Covert, MD   5 mg at 10/29/20 0745  . benztropine (COGENTIN) tablet 0.5 mg  0.5 mg Oral BID Sharma Covert, MD   0.5 mg at 10/29/20 0747  . haloperidol (HALDOL) tablet 10 mg  10 mg Oral Q6H PRN Sharma Covert, MD   10 mg at 10/28/20 1424   And  . benztropine (COGENTIN) tablet 1 mg  1 mg Oral BID PRN Sharma Covert, MD   1 mg at 10/29/20 0327  . carbamazepine (TEGRETOL) chewable tablet 200 mg  200 mg Oral TID Sharma Covert, MD   200 mg at  10/29/20 0745  . carvedilol (COREG) tablet 25 mg  25 mg Oral BID WC Sharma Covert, MD   25 mg at 10/29/20 0745  . haloperidol (HALDOL) tablet 10 mg  10 mg Oral Daily Sharma Covert, MD   10 mg at 10/29/20 0744  . haloperidol (HALDOL)  tablet 10 mg  10 mg Oral QHS Sharma Covert, MD      . haloperidol lactate (HALDOL) injection 10 mg  10 mg Intramuscular Daily Sharma Covert, MD      . hydrOXYzine (ATARAX/VISTARIL) tablet 25 mg  25 mg Oral TID PRN Sharma Covert, MD   25 mg at 10/29/20 0327  . [START ON 10/30/2020] influenza vac split quadrivalent PF (FLUARIX) injection 0.5 mL  0.5 mL Intramuscular Tomorrow-1000 Lindon Romp A, NP      . magnesium hydroxide (MILK OF MAGNESIA) suspension 30 mL  30 mL Oral Daily PRN Sharma Covert, MD      . OLANZapine [facility]) injection 10 mg  10 mg Intramuscular Q6H PRN Sharma Covert, MD      . OLANZapine [facility]) tablet 15 mg  15 mg Oral QHS Sharma Covert, MD      . OLANZapine zydis (ZYPREXA) disintegrating tablet 10 mg  10 mg Oral Daily Sharma Covert, MD   10 mg at 10/29/20 4196    Lab Results:  Results for orders placed or performed during the hospital encounter of 10/26/20 (from the past 48 hour(s))  T3     Status: Abnormal   Collection Time: 10/27/20  6:30 PM  Result Value Ref Range   T3, Total 191 (H) 71 - 180 ng/dL    Comment: (NOTE) Performed At: [facility] [place], [place] [id number] Rush Farmer MD [id number]   T4, free     Status: Abnormal   Collection Time: 10/27/20  6:30 PM  Result Value Ref Range   Free T4 1.22 (H) 0.61 - 1.12 ng/dL    Comment: (NOTE) Biotin ingestion may interfere with free T4 tests. If the results are inconsistent with the TSH level, previous test results, or the clinical presentation, then consider biotin interference. If needed, order repeat testing after stopping biotin. Performed at [facility], [place] [address] [place],  [place] [address]   Thyroid stimulating immunoglobulin     Status: None   Collection Time: 10/27/20  6:30 PM  Result Value Ref Range   Thyroid Stimulating Immunoglob <0.10 0.00 - 0.55 IU/L    Comment: (NOTE) Performed At: [facility] [address] [place], [place] [id number] Rush Farmer MD [id number]   Urinalysis, Complete w Microscopic Urine, Clean Catch     Status: Abnormal   Collection Time: 10/28/20  7:30 PM  Result Value Ref Range   Color, Urine YELLOW YELLOW   APPearance CLEAR CLEAR   Specific Gravity, Urine 1.009 1.005 - 1.030   pH 6.0 5.0 - 8.0   Glucose, UA NEGATIVE NEGATIVE mg/dL   Hgb urine dipstick NEGATIVE NEGATIVE   Bilirubin Urine NEGATIVE NEGATIVE   Ketones, ur 5 (A) NEGATIVE mg/dL   Protein, ur NEGATIVE NEGATIVE mg/dL   Nitrite NEGATIVE NEGATIVE   Leukocytes,Ua NEGATIVE NEGATIVE   RBC / HPF 0-5 0 - 5 RBC/hpf   WBC, UA 0-5 0 - 5 WBC/hpf   Bacteria, UA RARE (A) NONE SEEN   Squamous Epithelial / LPF 0-5 0 - 5    Comment: Performed at [facility], [place] [address] [place], [place] [address]    Blood Alcohol level:  Lab Results  Component Value Date   [facility] <10 10/25/2020   ETH <10 76/72/0947    Metabolic Disorder Labs: Lab Results  Component Value Date   HGBA1C 6.0 (H) 10/26/2020   MPG 125.5 10/26/2020   MPG 111.15 02/08/2020  Lab Results  Component Value Date   PROLACTIN 11.0 03/14/2019   Lab Results  Component Value Date   CHOL 173 10/26/2020   TRIG 158 (H) 10/26/2020   HDL 46 10/26/2020   CHOLHDL 3.8 10/26/2020   VLDL 32 10/26/2020   LDLCALC 95 10/26/2020   LDLCALC 76 02/08/2020    Physical Findings: AIMS: Facial and Oral Movements Muscles of Facial Expression: None, normal Lips and Perioral Area: None, normal Jaw: None, normal Tongue: None, normal,Extremity Movements Upper (arms, wrists, hands, fingers): None, normal Lower (legs, knees, ankles, toes): None, normal, Trunk Movements Neck, shoulders, hips:  None, normal, Overall Severity Severity of abnormal movements (highest score from questions above): None, normal Incapacitation due to abnormal movements: None, normal Patient's awareness of abnormal movements (rate only patient's report): No Awareness, Dental Status Current problems with teeth and/or dentures?: No Does patient usually wear dentures?: No  CIWA:    COWS:     Musculoskeletal: Strength & Muscle Tone: within normal limits Gait & Station: shuffle Patient leans: N/A  Psychiatric Specialty Exam: Physical Exam Vitals and nursing note reviewed.  HENT:     Head: Normocephalic and atraumatic.  Pulmonary:     Effort: Pulmonary effort is normal.  Neurological:     General: No focal deficit present.     Mental Status: She is alert.     Review of Systems  Blood pressure 116/71, pulse 71, temperature (!) 97.5 F (36.4 C), temperature source Oral, resp. rate 18, height 5\' 1"  (1.549 m), weight 67.1 kg.Body mass index is 27.96 kg/m.  General Appearance: Disheveled  Eye Contact:  Fair  Speech:  Garbled  Volume:  Normal  Mood:  Anxious and Dysphoric  Affect:  Labile  Thought Process:  Disorganized and Descriptions of Associations: Loose  Orientation:  Negative  Thought Content:  Delusions, Hallucinations: Auditory, Paranoid Ideation and Rumination  Suicidal Thoughts:  No  Homicidal Thoughts:  No  Memory:  Immediate;   Poor Recent;   Poor Remote;   Poor  Judgement:  Impaired  Insight:  Lacking  Psychomotor Activity:  Increased  Concentration:  Concentration: Poor and Attention Span: Poor  Recall:  Poor  Fund of Knowledge:  Poor  Language:  Poor  Akathisia:  Negative  Handed:  Right  AIMS (if indicated):     Assets:  Desire for Improvement Resilience  ADL's:  Impaired  Cognition:  Impaired,  Mild  Sleep:  Number of Hours: 3     Treatment Plan Summary: Daily contact with patient to assess and evaluate symptoms and progress in treatment, Medication management  and Plan : Patient is seen and examined.  Patient is a 54 year old female with the above-stated past psychiatric history who is seen in follow-up.   Diagnosis: 1. Schizoaffective disorder; bipolar type 2. Hypertension 3. Hyperthyroidism 4. Diabetes mellitus type 2 5.  Multiple thyroid nodules 6.  Multinodular goiter  Pertinent findings on examination today: 1.  Patient remains significantly disorganized, but agitation and mania seems to be slightly improved. 2.  TSI was negative. 3.  Repeat thyroid ultrasound showed a multinodular goiter with at least 2 nodules meeting criteria for biopsy. 4.  Patient sleep is still poor. 5.  Haldol and Geodon have been not fully successful in improving her psychotic illness.  Plan: 1.  Continue amlodipine 5 mg p.o. daily for hypertension. 2.  Continue Cogentin 0.5 mg p.o. twice daily for side effects of medication. 3.  Continue Tegretol 200 mg p.o. 3 times daily for mood stability. 4.  Continue carvedilol 25 mg p.o. twice daily with food for hypertension and hyperthyroidism. 5.  Decrease haloperidol to 10 mg p.o. twice daily for psychosis.  Continue to wean off Haldol if possible. 6.  Start Zyprexa 10 mg p.o. daily and 15 mg p.o. nightly for psychosis. 7.  Stop Geodon 8.  Start Zyprexa injectable 10 mg IM every 6 hours as needed agitation. 9.  Will attempt to have thyroid nodule biopsy scheduled prior to discharge, and inform family of the need for these biopsies. 10.  Disposition planning-in progress.  Sharma Covert, MD 10/29/2020, 10:22 AM

## 2020-10-29 NOTE — Progress Notes (Signed)
1:1 Note   Pt is currently up and continuously comes up to the nurses station. She remains disorganized and is hard to redirect. She will be directed to lay back down in bed to rest and will, but then within the next minute she will be up again. She is restless, fidgety, and hyper verbal. 1 mg of cogentin administered at 0327 and 25 mg of vistaril at 0327 as well. 1:1 continues for safety. Q 15 min safety checks continue. Pt's safety has been maintained.

## 2020-10-29 NOTE — Progress Notes (Signed)
1:1 Note: Patient observed lying down in bed awake with the sitter present at the bedside. Patient is alert and oriented to person. She is confused to time, place and situation. Her speech is tangential and she mumbles when conveying her thoughts. She is childlike, laughing inappropriately, restless, and intrusive. She remains on 1:1 observation for safety.

## 2020-10-29 NOTE — Progress Notes (Signed)
1:1 Note  Pt was administered 20 mg of geodon IM for agitation at 0429 due to ineffectiveness of cogentin and vistaril. Pt was cursing saying words like "shit" and was hype religious. Geodon IM was still ineffective. Pt is currently up walking around. Pt requires frequent redirections and remains intrusive. 1:1 continues to ensure safety since pt is disorganized and confused. Q 15 min safety checks continue. Pt's safety has been maintained.

## 2020-10-29 NOTE — Progress Notes (Signed)
Did not attend group 

## 2020-10-30 DIAGNOSIS — F250 Schizoaffective disorder, bipolar type: Secondary | ICD-10-CM | POA: Diagnosis not present

## 2020-10-30 MED ORDER — OLANZAPINE 10 MG PO TBDP
20.0000 mg | ORAL_TABLET | Freq: Every day | ORAL | Status: DC
  Administered 2020-10-30: 20 mg via ORAL
  Filled 2020-10-30 (×2): qty 2

## 2020-10-30 MED ORDER — AMLODIPINE BESYLATE 10 MG PO TABS
10.0000 mg | ORAL_TABLET | Freq: Every day | ORAL | Status: DC
  Administered 2020-10-31 – 2020-11-12 (×13): 10 mg via ORAL
  Filled 2020-10-30: qty 2
  Filled 2020-10-30 (×15): qty 1

## 2020-10-30 MED ORDER — LORAZEPAM 2 MG/ML IJ SOLN
2.0000 mg | Freq: Once | INTRAMUSCULAR | Status: AC
  Administered 2020-10-30: 2 mg via INTRAMUSCULAR
  Filled 2020-10-30: qty 1

## 2020-10-30 MED ORDER — HALOPERIDOL 5 MG PO TABS
5.0000 mg | ORAL_TABLET | Freq: Every day | ORAL | Status: DC
  Administered 2020-10-31: 5 mg via ORAL
  Filled 2020-10-30 (×3): qty 1

## 2020-10-30 NOTE — [facility] Group Notes (Signed)
[place] Group Notes: (Clinical Social Work)   10/30/2020      Type of Therapy:  Group Therapy   Participation Level:  Did Not Attend - was invited both individually by MHT and by overhead announcement, chose not to attend.   Selmer Dominion, LCSW 10/30/2020, 1:07 PM

## 2020-10-30 NOTE — Progress Notes (Signed)
1:1 note  Pt has continued to isolate. Pt has been active in their room, and per the MHT, became hostile a few times. Pt was redirectable. Pt has become more verbal in their room, continuing to be tangential though and cursing more. Pt was able to achieve a few hours of rest this afternoon. Pt denies any physical complaints or pain. Pt safe on the unit. q79m safety checks implemented and continued. Will continue to monitor. 1:1 continues for safety. Sitter within arms reach of pt.

## 2020-10-30 NOTE — Progress Notes (Signed)
[facility] MD Progress Note  10/30/2020 12:12 PM Elspeth Blucher Scardina  MRN:  [id number] Subjective:  Patient is a 54 year old female with a reported past psychiatric history significant for schizoaffective disorder; bipolar type who originally presented to the [facility] emergency department on 10/25/2020 after her family members had brought her to the hospital because of worsening manic symptoms.  Objective: Patient is seen and examined.  Patient is a 54 year old female with the above-stated past psychiatric history who is seen in follow-up.  Unfortunately she continued did not sleep well last night.  She is asleep this morning.  She is laying in bed, and arousable but sedated.  Nursing notes reflect she only got 3 hours of sleep last p.m. despite the Zyprexa at 15 mg and haloperidol at 10 mg.  This a.m. she did receive Haldol 10 mg and Zyprexa 10 mg.  Hopefully the medications are beginning to kick in and we can start consolidating things.  On examination today really the only thing that she said back to me was "I understand".  Her speech is still garbled, she is sedated, and not as physically agitated as yesterday.  She did require the intramuscular Zyprexa last night.  Yesterday afternoon in my second examination the patient for the day she was improved to the point she was able to carry on somewhat of a less than disorganized conversation.  Her blood pressure remains elevated.  It was 147/100 earlier, and repeat was 141/106.  Her heart rate went between 91 and 124.  As stated previously she does have hyperthyroidism.  Her ultrasound confirmed the nodules that have been seen previously.  It is unclear at this point the role of her hyperthyroidism in her psychotic state.  Principal Problem: <principal problem not specified> Diagnosis: Active Problems:   Schizophrenia ([place])  Total Time spent with patient: 20 minutes  Past Psychiatric History: See admission H&P  Past Medical History:   Past Medical History:  Diagnosis Date  . Bipolar affective disorder ([place])   . History of arthritis   . History of chicken pox   . History of depression   . History of genital warts   . history of heart murmur   . History of high blood pressure   . History of thyroid disease   . History of UTI   . Hypertension   . Low TSH level 07/13/2017  . Schizophrenia [facility])     Past Surgical History:  Procedure Laterality Date  . ABLATION ON ENDOMETRIOSIS    . CYST REMOVAL NECK     around 11 years ago /benign  . MULTIPLE TOOTH EXTRACTIONS     Family History:  Family History  Problem Relation Age of Onset  . Arthritis Father   . Hyperlipidemia Father   . High blood pressure Father   . Diabetes Sister   . Diabetes Mother   . Diabetes Brother   . Mental illness Brother   . Alcohol abuse Paternal Uncle   . Alcohol abuse Paternal Grandfather   . Breast cancer Maternal Aunt   . Breast cancer Paternal Aunt   . High blood pressure Sister   . Mental illness Other        runs in family   Family Psychiatric  History: See admission H&P Social History:  Social History   Substance and Sexual Activity  Alcohol Use No     Social History   Substance and Sexual Activity  Drug Use No    Social History   Socioeconomic  History  . Marital status: Single    Spouse name: Not on file  . Number of children: Not on file  . Years of education: Not on file  . Highest education level: Patient refused  Occupational History  . Not on file  Tobacco Use  . Smoking status: Never Smoker  . Smokeless tobacco: Never Used  Vaping Use  . Vaping Use: Never used  Substance and Sexual Activity  . Alcohol use: No  . Drug use: No  . Sexual activity: Not Currently  Other Topics Concern  . Not on file  Social History Narrative  . Not on file   Social Determinants of Health   Financial Resource Strain:   . Difficulty of Paying Living Expenses: Not on file  Food Insecurity:   . Worried About  Charity fundraiser in the Last Year: Not on file  . Ran Out of Food in the Last Year: Not on file  Transportation Needs:   . Lack of Transportation (Medical): Not on file  . Lack of Transportation (Non-Medical): Not on file  Physical Activity:   . Days of Exercise per Week: Not on file  . Minutes of Exercise per Session: Not on file  Stress:   . Feeling of Stress : Not on file  Social Connections:   . Frequency of Communication with Friends and Family: Not on file  . Frequency of Social Gatherings with Friends and Family: Not on file  . Attends Religious Services: Not on file  . Active Member of Clubs or Organizations: Not on file  . Attends [name] Meetings: Not on file  . Marital Status: Not on file   Additional Social History:                         Sleep: Poor  Appetite:  Fair  Current Medications: Current Facility-Administered Medications  Medication Dose Route Frequency Provider Last Rate Last Admin  . acetaminophen (TYLENOL) tablet 650 mg  650 mg Oral Q6H PRN Sharma Covert, MD   650 mg at 10/27/20 1418  . alum & mag hydroxide-simeth (MAALOX/MYLANTA) 200-200-20 MG/5ML suspension 30 mL  30 mL Oral Q4H PRN Sharma Covert, MD      . amLODipine (NORVASC) tablet 5 mg  5 mg Oral Daily Sharma Covert, MD   5 mg at 10/30/20 0909  . benztropine (COGENTIN) tablet 0.5 mg  0.5 mg Oral BID Sharma Covert, MD   0.5 mg at 10/30/20 4098  . haloperidol (HALDOL) tablet 10 mg  10 mg Oral Q6H PRN Sharma Covert, MD   10 mg at 10/28/20 1424   And  . benztropine (COGENTIN) tablet 1 mg  1 mg Oral BID PRN Sharma Covert, MD   1 mg at 10/29/20 0327  . carbamazepine (TEGRETOL) chewable tablet 200 mg  200 mg Oral TID Sharma Covert, MD   200 mg at 10/30/20 0908  . carvedilol (COREG) tablet 25 mg  25 mg Oral BID WC Sharma Covert, MD   25 mg at 10/30/20 0908  . haloperidol (HALDOL) tablet 10 mg  10 mg Oral Daily Sharma Covert, MD   10 mg  at 10/30/20 0908  . haloperidol (HALDOL) tablet 10 mg  10 mg Oral QHS Sharma Covert, MD   10 mg at 10/29/20 2007  . haloperidol lactate (HALDOL) injection 10 mg  10 mg Intramuscular Daily Sharma Covert, MD      .  hydrOXYzine (ATARAX/VISTARIL) tablet 25 mg  25 mg Oral TID PRN Sharma Covert, MD   25 mg at 10/29/20 2007  . influenza vac split quadrivalent PF (FLUARIX) injection 0.5 mL  0.5 mL Intramuscular Tomorrow-1000 Lindon Romp A, NP      . magnesium hydroxide (MILK OF MAGNESIA) suspension 30 mL  30 mL Oral Daily PRN Sharma Covert, MD      . OLANZapine [facility]) injection 10 mg  10 mg Intramuscular Q6H PRN Sharma Covert, MD      . OLANZapine zydis (ZYPREXA) disintegrating tablet 10 mg  10 mg Oral Daily Sharma Covert, MD   10 mg at 10/30/20 0909  . OLANZapine zydis (ZYPREXA) disintegrating tablet 15 mg  15 mg Oral QHS Sharma Covert, MD   15 mg at 10/29/20 2006    Lab Results:  Results for orders placed or performed during the hospital encounter of 10/26/20 (from the past 48 hour(s))  Urinalysis, Complete w Microscopic Urine, Clean Catch     Status: Abnormal   Collection Time: 10/28/20  7:30 PM  Result Value Ref Range   Color, Urine YELLOW YELLOW   APPearance CLEAR CLEAR   Specific Gravity, Urine 1.009 1.005 - 1.030   pH 6.0 5.0 - 8.0   Glucose, UA NEGATIVE NEGATIVE mg/dL   Hgb urine dipstick NEGATIVE NEGATIVE   Bilirubin Urine NEGATIVE NEGATIVE   Ketones, ur 5 (A) NEGATIVE mg/dL   Protein, ur NEGATIVE NEGATIVE mg/dL   Nitrite NEGATIVE NEGATIVE   Leukocytes,Ua NEGATIVE NEGATIVE   RBC / HPF 0-5 0 - 5 RBC/hpf   WBC, UA 0-5 0 - 5 WBC/hpf   Bacteria, UA RARE (A) NONE SEEN   Squamous Epithelial / LPF 0-5 0 - 5    Comment: Performed at [facility], [place] [address], [place],  [address]    Blood Alcohol level:  Lab Results  Component Value Date   [facility] <10 10/25/2020   ETH <10 58/07/9832    Metabolic Disorder Labs: Lab  Results  Component Value Date   HGBA1C 6.0 (H) 10/26/2020   MPG 125.5 10/26/2020   MPG 111.15 02/08/2020   Lab Results  Component Value Date   PROLACTIN 11.0 03/14/2019   Lab Results  Component Value Date   CHOL 173 10/26/2020   TRIG 158 (H) 10/26/2020   HDL 46 10/26/2020   CHOLHDL 3.8 10/26/2020   VLDL 32 10/26/2020   LDLCALC 95 10/26/2020   LDLCALC 76 02/08/2020    Physical Findings: AIMS: Facial and Oral Movements Muscles of Facial Expression: None, normal Lips and Perioral Area: None, normal Jaw: None, normal Tongue: None, normal,Extremity Movements Upper (arms, wrists, hands, fingers): None, normal Lower (legs, knees, ankles, toes): None, normal, Trunk Movements Neck, shoulders, hips: None, normal, Overall Severity Severity of abnormal movements (highest score from questions above): None, normal Incapacitation due to abnormal movements: None, normal Patient's awareness of abnormal movements (rate only patient's report): No Awareness, Dental Status Current problems with teeth and/or dentures?: No Does patient usually wear dentures?: No  CIWA:    COWS:     Musculoskeletal: Strength & Muscle Tone: decreased Gait & Station: shuffle Patient leans: N/A  Psychiatric Specialty Exam: Physical Exam Vitals and nursing note reviewed.  HENT:     Head: Normocephalic and atraumatic.  Pulmonary:     Effort: Pulmonary effort is normal.  Neurological:     General: No focal deficit present.     Mental Status: She is alert.  Review of Systems  Blood pressure (!) 141/106, pulse (!) 124, temperature 97.9 F (36.6 C), temperature source Oral, resp. rate 20, height 5\' 1"  (1.549 m), weight 67.1 kg.Body mass index is 27.96 kg/m.  General Appearance: Disheveled  Eye Contact:  Minimal  Speech:  Garbled  Volume:  Decreased  Mood:  Dysphoric and Sedated  Affect:  Flat  Thought Process:  Disorganized and Descriptions of Associations: Loose  Orientation:  Negative  Thought  Content:  Delusions, Hallucinations: Auditory, Paranoid Ideation, Rumination and Tangential  Suicidal Thoughts:  No  Homicidal Thoughts:  No  Memory:  Immediate;   Poor Recent;   Poor Remote;   Poor  Judgement:  Impaired  Insight:  Lacking  Psychomotor Activity:  Decreased  Concentration:  Concentration: Poor and Attention Span: Poor  Recall:  Poor  Fund of Knowledge:  Poor  Language:  Fair  Akathisia:  Negative  Handed:  Right  AIMS (if indicated):     Assets:  Desire for Improvement Resilience  ADL's:  Impaired  Cognition:  Impaired,  Mild  Sleep:  Number of Hours: 3     Treatment Plan Summary: Daily contact with patient to assess and evaluate symptoms and progress in treatment, Medication management and Plan : Patient is seen and examined.  Patient is a 54 year old female with the above-stated past psychiatric history who is seen in follow-up.   Diagnosis: 1. Schizoaffective disorder; bipolar type 2. Hypertension 3. Hyperthyroidism 4. Diabetes mellitus type 2 5.  Multiple thyroid nodules 6.  Multinodular goiter  Pertinent findings on examination today: 1.  Patient remains disorganized, not sleeping but is currently sedated. 2.  Blood pressure is still elevated mildly. 3.  Zyprexa seems to be more effective than Haldol and Geodon have been so far.  Plan: 1.  Decrease haloperidol to 5 mg p.o. daily and continue 10 mg p.o. nightly for now.  This is for psychosis. 2.  Continue Zyprexa 10 mg p.o. daily and increase nightly dosage to 20 mg p.o. nightly for psychosis. 3.  Continue Zyprexa 10 mg IM every 6 hours as needed agitation. 4.  Increase amlodipine to 10 mg p.o. daily for hypertension. 5.  Continue carvedilol 25 mg p.o. twice daily for hypertension and hyperthyroidism. 6.  Patient will need thyroid nodule biopsy after discharge. 7.  Disposition planning-in progress.    Sharma Covert, MD 10/30/2020, 12:12 PM

## 2020-10-30 NOTE — Plan of Care (Signed)
1:1 note  Pt found in bed; complaint with medication administration. Pt seems to be more tangential, intrusive, and agitated this morning. Pt continues to be paranoid with medications. Pt also displays poor boundaries. Pt is watchful and becomes irritated with too many questions. Pt is pleasant though. Pt denies si/hi/ah/vh and verbally agrees to approach staff if these become apparent or before harming themselves/others while at [facility].  A: Pt provided support and encouragement. Pt given medication per protocol and standing orders. Q72m safety checks implemented and continued. 1:1 sitter within arms reach. R: Pt safe on the unit. Will continue to monitor. 1:1 continues for safety.  Pt progressing in the following metrics  Problem: Activity: Goal: Sleeping patterns will improve Outcome: Progressing   Problem: Coping: Goal: Ability to demonstrate self-control will improve Outcome: Progressing   Problem: Health Behavior/Discharge Planning: Goal: Compliance with treatment plan for underlying cause of condition will improve Outcome: Progressing

## 2020-10-30 NOTE — Progress Notes (Signed)
1:1 NOTE  Pt compliant with medication. Has been pacing in the room, pressing the star alarm in her room, very anxious and disoriented, restless and disorganized, with rapid pressured speech. Pt continuously monitored on 1:1 observation for impulsiveness, intrusive and disorganization.

## 2020-10-30 NOTE — Progress Notes (Signed)
The pt is observed in dayroom, she remains on 1:1 with sitter for safety. She is cooperative with treatment and redirectable at this time. Patient did require a Xyprexa IM during the night it was somewhat effect but patient still remains hyperverbal., intrusive and somewhat disorganized.

## 2020-10-30 NOTE — Progress Notes (Signed)
1:1 note  Pt has become increasing active and agitated since lunch. Pt has been verbally abusive, cursing staff and others. Pt has tried to elope multiple times. Pt's speech continues to be rapid, slurred, rhyming, and tangential. Pt is compliant with medications with encouragement. IM medications were given because of agitation with little/no impact. Pt safe on the unit. q78m safety checks implemented and continued. 1:1 continues for safety. Sitter within arms reach. Will continue to monitor.

## 2020-10-30 NOTE — Progress Notes (Signed)
   10/30/20 2100  Psych Admission Type (Psych Patients Only)  Admission Status Voluntary  Psychosocial Assessment  Patient Complaints Anxiety;Disorientation;Confusion;Restlessness;Suspiciousness  Eye Contact Intense;Watchful  Facial Expression Anxious;Pensive  Affect Angry;Anxious;Preoccupied  Speech Rapid;Pressured;Slurred;Equities trader;Forwards little;Hostile;Intrusive  Motor Activity Fidgety;Restless;Shuffling  Appearance/Hygiene Disheveled;Poor hygiene  Behavior Characteristics Fidgety;Pacing;Restless  Mood Suspicious;Labile;Anxious;Preoccupied  Thought Process  Coherency Disorganized;Flight of ideas;Tangential  Content Obsessions;Paranoia  Delusions Paranoid  Perception Derealization  Hallucination None reported or observed  Judgment Limited  Confusion Moderate  Danger to Self  Current suicidal ideation? Denies  Danger to Others  Danger to Others None reported or observed

## 2020-10-31 DIAGNOSIS — F250 Schizoaffective disorder, bipolar type: Secondary | ICD-10-CM | POA: Diagnosis not present

## 2020-10-31 MED ORDER — BENZTROPINE MESYLATE 0.5 MG PO TABS
0.5000 mg | ORAL_TABLET | Freq: Two times a day (BID) | ORAL | Status: DC
  Administered 2020-10-31 – 2020-11-02 (×4): 0.5 mg via ORAL
  Filled 2020-10-31 (×8): qty 1

## 2020-10-31 MED ORDER — OLANZAPINE 10 MG PO TBDP
25.0000 mg | ORAL_TABLET | Freq: Every day | ORAL | Status: DC
  Administered 2020-10-31 – 2020-11-07 (×8): 25 mg via ORAL
  Filled 2020-10-31 (×2): qty 2.5
  Filled 2020-10-31: qty 5
  Filled 2020-10-31 (×7): qty 2.5

## 2020-10-31 MED ORDER — OLANZAPINE 5 MG PO TBDP
15.0000 mg | ORAL_TABLET | Freq: Every day | ORAL | Status: DC
  Administered 2020-11-01 – 2020-11-08 (×8): 15 mg via ORAL
  Filled 2020-10-31 (×10): qty 3

## 2020-10-31 MED ORDER — HALOPERIDOL 5 MG PO TABS
5.0000 mg | ORAL_TABLET | Freq: Every day | ORAL | Status: DC
  Administered 2020-10-31 – 2020-11-01 (×2): 5 mg via ORAL
  Filled 2020-10-31 (×5): qty 1

## 2020-10-31 NOTE — [facility] Group Notes (Signed)
Psychoeducational Group Note  Date: 10-31-20 Time:  1300  Group Topic/Focus:  Making Healthy Choices:   The focus of this group is to help patients identify negative/unhealthy choices they were using prior to admission and identify positive/healthier coping strategies to replace them upon discharge.  Participation Level:  Active  Participation Quality:  Appropriate  Affect:  Appropriate  Cognitive:  Oriented  Insight:  Improving  Engagement in Group:  Engaged  Additional Comments:.Pt attended the group. Got and down numerous times while we were in group. When asked about her positives, Pt sang a couple of religious songs and then recited phrases from the Bible which could be interpreted as strengths of a person.  [name]

## 2020-10-31 NOTE — Progress Notes (Signed)
Pt very anxious and aggitated irritable, and restless with tangential coherency, HS medication given at 2100 not effective. Provider notified OTO of ativan 2 mg IM given and cogentin 1mg  PO given at 2228 medications effective. Patient able to relax and sleep. 1:1 Observation ongoing without incidence at present. Will continue to monitor.

## 2020-10-31 NOTE — [facility] Group Notes (Signed)
Adult Psychoeducational Group Not Date:  10/31/2020 Time:  0900-1045 Group Topic/Focus: PROGRESSIVE RELAXATION. A group where deep breathing is taught and tensing and relaxation muscle groups is used. Imagery is used as well.  Pts are asked to imagine 3 pillars that hold them up when they are not able to hold themselves up.  Participation Level:  Active  Participation Quality:  For the most part. Did slide around in her chair alot  Affect:  Appropriate  Cognitive:  Delusional   Insight: lacking  Engagement in Group:  Engaged  Modes of Intervention:  Activity, Discussion, Education, and Support  Additional Comments:  Pt did participate in the group. Was able to maintain her silence and followed the progression of relaxation.   [name] 10/31/2020`

## 2020-10-31 NOTE — Progress Notes (Addendum)
1:1 Note 1030  Patient has been up in the hallway walking with 1:1.  Patient has also sat in the dayroom, talking to peers, laughing with the group.  Respirations even and unlabored.  No signs/sympoms of pain/distress noted on patient's face/body movements.  1:1 continues for safety per MD orders.

## 2020-10-31 NOTE — [facility] Group Notes (Signed)
.  Psychoeducational Group Note  Date: 10-30-20 Time: 0900-1000    Goal Setting   Purpose of Group: This group helps to provide patients with the steps of setting a goal that is specific, measurable, attainable, realistic and time specific. A discussion on how we keep ourselves stuck with negative self talk.    Participation Level:  Did not attend   [name]

## 2020-10-31 NOTE — Progress Notes (Addendum)
1:1 Note 0730  Patient walking hallway with 1:1 present.  Respirations even and unlabored.  No signs/sympoms of pain/distress noted on patient's face/body movements.  Safety maintained with 1:1 per MD order.

## 2020-10-31 NOTE — Progress Notes (Addendum)
1:1 Note 1320 Patient had been sleeping in her bed.  Now patient walking with 1:1 in the hallway.  Patient continues to talk and laugh with peers.  Respirations even and unlabored.  No signs/symptoms of pain/distress noted on patient's face/body movements.  1:1 continues for safety with body movements.  Marland Kitchen

## 2020-10-31 NOTE — Progress Notes (Signed)
Adult Psychoeducational Group Note  Date:  10/31/2020 Time:  9:33 PM  Group Topic/Focus:  Wrap-Up Group:   The focus of this group is to help patients review their daily goal of treatment and discuss progress on daily workbooks.  Participation Level:  Did Not Attend  Participation Quality:  Did Not Attend  Affect:  Did Not Attend  Cognitive:  Did Not Attend  Insight: None  Engagement in Group:  Did Not Attend  Modes of Intervention:  Did Not Attend  Additional Comments:  Pt did not attend evening wrap up group tonight.  [name] 10/31/2020, 9:33 PM

## 2020-10-31 NOTE — [facility] Group Notes (Signed)
.  Psychoeducational Group Note  Date: 10-30-20 Time: 0900-1000    Goal Setting   Purpose of Group: This group helps to provide patients with the steps of setting a goal that is specific, measurable, attainable, realistic and time specific. A discussion on how we keep ourselves stuck with negative self talk.    Participation Level:  Did not attend  Participation Quality:  Appropriate  Affect:  Appropriate  Cognitive:  Appropriate  Insight:  Improving  Engagement in Group:  Engaged  Additional Comments:  ...  Paula Dickson

## 2020-10-31 NOTE — Plan of Care (Signed)
Nurse discussed anxiety, depression and coping skills with patient.  

## 2020-10-31 NOTE — Progress Notes (Signed)
[facility] MD Progress Note  10/31/2020 11:42 AM Kamela Blansett Delisa  MRN:  [id number] Subjective:  Patient is a 54 year old female with a reported past psychiatric history significant for schizoaffective disorder; bipolar type who originally presented to the [facility] emergency department on 10/25/2020 after her family members had brought her to the hospital because of worsening manic symptoms.  Objective: Patient is seen and examined.  Patient is a 54 year old female with the above-stated past psychiatric history who is seen in follow-up.  Unfortunately she still did not sleep great last night.  The Zyprexa alone did not truly put her down for the night, and she required as needed medication.  After the supplementation with the Zyprexa and the as needed medication she slept approximately 5.75 hours last night.  On examination today she still disorganized.  She still a bit irritable.  I asked her about the date, and she stated "I told him yesterday", and then walked out of the room.  She is able to sit and remain stable to a degree.  When I walked by the group room and she is there she is not pacing during the groups.  She does have a tremor today, but I may not have noticed that because she has been in bed several times when I examined her before.  That may be related to the Haldol.  Her vital signs are stable, she is afebrile.  No new laboratories.  Again she has the history of hyperthyroidism.  She has a multinodular goiter.  She has at least 2 nodules which appear to require biopsy.  Principal Problem: <principal problem not specified> Diagnosis: Active Problems:   Schizophrenia ([place])  Total Time spent with patient: 20 minutes  Past Psychiatric History: See admission H&P  Past Medical History:  Past Medical History:  Diagnosis Date  . Bipolar affective disorder ([place])   . History of arthritis   . History of chicken pox   . History of depression   . History of genital warts    . history of heart murmur   . History of high blood pressure   . History of thyroid disease   . History of UTI   . Hypertension   . Low TSH level 07/13/2017  . Schizophrenia [facility])     Past Surgical History:  Procedure Laterality Date  . ABLATION ON ENDOMETRIOSIS    . CYST REMOVAL NECK     around 11 years ago /benign  . MULTIPLE TOOTH EXTRACTIONS     Family History:  Family History  Problem Relation Age of Onset  . Arthritis Father   . Hyperlipidemia Father   . High blood pressure Father   . Diabetes Sister   . Diabetes Mother   . Diabetes Brother   . Mental illness Brother   . Alcohol abuse Paternal Uncle   . Alcohol abuse Paternal Grandfather   . Breast cancer Maternal Aunt   . Breast cancer Paternal Aunt   . High blood pressure Sister   . Mental illness Other        runs in family   Family Psychiatric  History: See admission H&P Social History:  Social History   Substance and Sexual Activity  Alcohol Use No     Social History   Substance and Sexual Activity  Drug Use No    Social History   Socioeconomic History  . Marital status: Single    Spouse name: Not on file  . Number of children: Not on file  .  Years of education: Not on file  . Highest education level: Patient refused  Occupational History  . Not on file  Tobacco Use  . Smoking status: Never Smoker  . Smokeless tobacco: Never Used  Vaping Use  . Vaping Use: Never used  Substance and Sexual Activity  . Alcohol use: No  . Drug use: No  . Sexual activity: Not Currently  Other Topics Concern  . Not on file  Social History Narrative  . Not on file   Social Determinants of Health   Financial Resource Strain:   . Difficulty of Paying Living Expenses: Not on file  Food Insecurity:   . Worried About Charity fundraiser in the Last Year: Not on file  . Ran Out of Food in the Last Year: Not on file  Transportation Needs:   . Lack of Transportation (Medical): Not on file  . Lack of  Transportation (Non-Medical): Not on file  Physical Activity:   . Days of Exercise per Week: Not on file  . Minutes of Exercise per Session: Not on file  Stress:   . Feeling of Stress : Not on file  Social Connections:   . Frequency of Communication with Friends and Family: Not on file  . Frequency of Social Gatherings with Friends and Family: Not on file  . Attends Religious Services: Not on file  . Active Member of Clubs or Organizations: Not on file  . Attends [name] Meetings: Not on file  . Marital Status: Not on file   Additional Social History:                         Sleep: Fair  Appetite:  Fair  Current Medications: Current Facility-Administered Medications  Medication Dose Route Frequency Provider Last Rate Last Admin  . acetaminophen (TYLENOL) tablet 650 mg  650 mg Oral Q6H PRN Sharma Covert, MD   650 mg at 10/30/20 2247  . alum & mag hydroxide-simeth (MAALOX/MYLANTA) 200-200-20 MG/5ML suspension 30 mL  30 mL Oral Q4H PRN Sharma Covert, MD      . amLODipine (NORVASC) tablet 10 mg  10 mg Oral Daily Sharma Covert, MD   10 mg at 10/31/20 0900  . haloperidol (HALDOL) tablet 10 mg  10 mg Oral Q6H PRN Sharma Covert, MD   10 mg at 10/30/20 1448   And  . benztropine (COGENTIN) tablet 1 mg  1 mg Oral BID PRN Sharma Covert, MD   1 mg at 10/30/20 2228  . carbamazepine (TEGRETOL) chewable tablet 200 mg  200 mg Oral TID Sharma Covert, MD   200 mg at 10/31/20 1124  . carvedilol (COREG) tablet 25 mg  25 mg Oral BID WC Sharma Covert, MD   25 mg at 10/31/20 0900  . haloperidol (HALDOL) tablet 5 mg  5 mg Oral Daily Sharma Covert, MD   5 mg at 10/31/20 0900  . haloperidol (HALDOL) tablet 5 mg  5 mg Oral QHS Sharma Covert, MD      . hydrOXYzine (ATARAX/VISTARIL) tablet 25 mg  25 mg Oral TID PRN Sharma Covert, MD   25 mg at 10/30/20 2034  . influenza vac split quadrivalent PF (FLUARIX) injection 0.5 mL  0.5 mL  Intramuscular Tomorrow-1000 Lindon Romp A, NP      . magnesium hydroxide (MILK OF MAGNESIA) suspension 30 mL  30 mL Oral Daily PRN Sharma Covert, MD      .  OLANZapine (ZYPREXA) injection 10 mg  10 mg Intramuscular Q6H PRN Sharma Covert, MD   10 mg at 10/30/20 1545  . [START ON 11/01/2020] OLANZapine zydis (ZYPREXA) disintegrating tablet 15 mg  15 mg Oral Daily Sharma Covert, MD      . OLANZapine zydis [facility]) disintegrating tablet 25 mg  25 mg Oral QHS Sharma Covert, MD        Lab Results: No results found for this or any previous visit (from the past 48 hour(s)).  Blood Alcohol level:  Lab Results  Component Value Date   ETH <10 10/25/2020   ETH <10 21/22/4825    Metabolic Disorder Labs: Lab Results  Component Value Date   HGBA1C 6.0 (H) 10/26/2020   MPG 125.5 10/26/2020   MPG 111.15 02/08/2020   Lab Results  Component Value Date   PROLACTIN 11.0 03/14/2019   Lab Results  Component Value Date   CHOL 173 10/26/2020   TRIG 158 (H) 10/26/2020   HDL 46 10/26/2020   CHOLHDL 3.8 10/26/2020   VLDL 32 10/26/2020   LDLCALC 95 10/26/2020   LDLCALC 76 02/08/2020    Physical Findings: AIMS: Facial and Oral Movements Muscles of Facial Expression: None, normal Lips and Perioral Area: None, normal Jaw: None, normal Tongue: None, normal,Extremity Movements Upper (arms, wrists, hands, fingers): None, normal Lower (legs, knees, ankles, toes): None, normal, Trunk Movements Neck, shoulders, hips: None, normal, Overall Severity Severity of abnormal movements (highest score from questions above): None, normal Incapacitation due to abnormal movements: None, normal Patient's awareness of abnormal movements (rate only patient's report): No Awareness, Dental Status Current problems with teeth and/or dentures?: No Does patient usually wear dentures?: No  CIWA:    COWS:     Musculoskeletal: Strength & Muscle Tone: within normal limits Gait & Station:  shuffle Patient leans: N/A  Psychiatric Specialty Exam: Physical Exam Vitals and nursing note reviewed. Exam conducted with a chaperone present.  HENT:     Head: Normocephalic and atraumatic.  Pulmonary:     Effort: Pulmonary effort is normal.  Neurological:     General: No focal deficit present.     Mental Status: She is alert.     Review of Systems  Blood pressure 129/88, pulse 87, temperature 97.9 F (36.6 C), temperature source Oral, resp. rate 20, height 5\' 1"  (1.549 m), weight 67.1 kg.Body mass index is 27.96 kg/m.  General Appearance: Disheveled  Eye Contact:  Minimal  Speech:  Pressured  Volume:  Decreased  Mood:  Anxious and Dysphoric  Affect:  Labile  Thought Process:  Disorganized and Descriptions of Associations: Loose  Orientation:  Negative  Thought Content:  Delusions, Paranoid Ideation, Rumination and Tangential  Suicidal Thoughts:  No  Homicidal Thoughts:  No  Memory:  Immediate;   Poor Recent;   Poor Remote;   Poor  Judgement:  Impaired  Insight:  Lacking  Psychomotor Activity:  Increased  Concentration:  Concentration: Poor and Attention Span: Poor  Recall:  Poor  Fund of Knowledge:  Fair  Language:  Fair  Akathisia:  Yes  Handed:  Right  AIMS (if indicated):     Assets:  Desire for Improvement Resilience  ADL's:  Intact  Cognition:  WNL  Sleep:  Number of Hours: 5.75     Treatment Plan Summary: Daily contact with patient to assess and evaluate symptoms and progress in treatment, Medication management and Plan : Patient is seen and examined.  Patient is a 54 year old female with the above-stated past  psychiatric history who is seen in follow-up.   Diagnosis: 1. Schizoaffective disorder; bipolar type 2. Hypertension 3. Hyperthyroidism 4. Diabetes mellitus type 2 5. Multiple thyroid nodules 6. Multinodular goiter  Pertinent findings on examination today: 1.  Patient remains disorganized, psychotic, tangential. 2.  Sleep is only  mildly improved. 3.  Blood pressure is improved 4.  Patient's orientation is suspect.  Plan: 1.  Continue amlodipine 10 mg p.o. daily for hypertension. 2.  Continue Tegretol 200 mg p.o. 3 times daily for mood stability. 3.  Continue carvedilol 25 mg p.o. twice daily with food for hypertension and rate control. 4.  Decrease Haldol to 5 mg p.o. nightly only.  This is for psychosis. 5.  Continue hydroxyzine 25 mg p.o. 3 times daily as needed anxiety. 6.  Increase olanzapine to 15 mg p.o. daily and 25 mg p.o. nightly for psychosis and mood stability. 7.  Patient will require biopsy of thyroid nodules after stabilization of mood and improvement in psychosis. 8.  Restart Cogentin 0.5 mg p.o. twice daily for tremor and possible EPS. 9.  CBC with differential, Tegretol level and complete metabolic panel in a.m. tomorrow. 10.  Disposition planning-in progress.   Sharma Covert, MD 10/31/2020, 11:42 AM

## 2020-10-31 NOTE — Progress Notes (Signed)
1:1Note 1600   Patient has been in [place], talking to peers, walking hallway.  Respirations even and unlabored.  No signs/symptoms of pain/distress noted on patient's face/body movements.  Safety maintained with 1:1 for safety.

## 2020-10-31 NOTE — Progress Notes (Signed)
1:1 Note 1840    Patient has been sitting on her bed eating her dinner.  Ate one plate and then asked for extras which were given to her.  Patient has been talking alittle clearer today at times, and then reverts back to confusion.  Respirations even and unlabored.  No signs/symptoms of pain/distress noted on patient's face/body movements.  Patient has also been walking in hallway, sitting in dayroom, talking to peers today.  1:1 continues per MD order for safety.

## 2020-10-31 NOTE — Progress Notes (Signed)
Adult Psychoeducational Group Note  Date:  10/31/2020 Time:  12:49 AM  Group Topic/Focus:  Wrap-Up Group:   The focus of this group is to help patients review their daily goal of treatment and discuss progress on daily workbooks.  Participation Level:  Did Not Attend  Participation Quality:  Did Not Attend  Affect:  Did Not Attend  Cognitive:  Did Not Attend  Insight: None  Engagement in Group:  Did Not Attend  Modes of Intervention:  Did Not Attend  Additional Comments:  Pt did not attend evening wrap up group tonight.  [name] 10/31/2020, 12:49 AM

## 2020-10-31 NOTE — Progress Notes (Signed)
1:1 NOTE  Pt in bed sleeping respirations noted. Continued to monitor 1:1 observations without any incident at present. Will continue to monitor.

## 2020-10-31 NOTE — [facility] Group Notes (Signed)
[place] LCSW Group Therapy Note  Date/Time:  10/31/2020  11:00AM-12:00PM  Type of Therapy and Topic:  Group Therapy:  Music and Mood  Participation Level:  Minimal   Description of Group: In this process group, members listened to a variety of genres of music and identified that different types of music evoke different responses.  Patients were encouraged to identify music that was soothing for them and music that was energizing for them.  Patients discussed how this knowledge can help with wellness and recovery in various ways including managing depression and anxiety as well as encouraging healthy sleep habits.    Therapeutic Goals: Patients will explore the impact of different varieties of music on mood Patients will verbalize the thoughts they have when listening to different types of music Patients will identify music that is soothing to them as well as music that is energizing to them Patients will discuss how to use this knowledge to assist in maintaining wellness and recovery Patients will explore the use of music as a coping skill  Summary of Patient Progress:  At the beginning of group, patient expressed that she was thirsty.  Her thoughts were disorganized and she had to be prompted with the same question numerous times before she answered.  She danced to a few songs and seemed to enjoy the music, then just left the room and did not return.    Therapeutic Modalities: Solution Focused Brief Therapy Activity   Selmer Dominion, LCSW

## 2020-11-01 DIAGNOSIS — F200 Paranoid schizophrenia: Secondary | ICD-10-CM

## 2020-11-01 LAB — COMPREHENSIVE METABOLIC PANEL
ALT: 17 U/L (ref 0–44)
AST: 15 U/L (ref 15–41)
Albumin: 4.5 g/dL (ref 3.5–5.0)
Alkaline Phosphatase: 82 U/L (ref 38–126)
Anion gap: 9 (ref 5–15)
BUN: 27 mg/dL — ABNORMAL HIGH (ref 6–20)
CO2: 27 mmol/L (ref 22–32)
Calcium: 9.7 mg/dL (ref 8.9–10.3)
Chloride: 104 mmol/L (ref 98–111)
Creatinine, Ser: 1.06 mg/dL — ABNORMAL HIGH (ref 0.44–1.00)
GFR, Estimated: >60 mL/min (ref >60)
Glucose, Bld: 124 mg/dL — ABNORMAL HIGH (ref 70–99)
Potassium: 4 mmol/L (ref 3.5–5.1)
Sodium: 140 mmol/L (ref 135–145)
Total Bilirubin: 0.3 mg/dL (ref 0.3–1.2)
Total Protein: 7.2 g/dL (ref 6.5–8.1)

## 2020-11-01 LAB — CBC WITH DIFFERENTIAL/PLATELET
Abs Immature Granulocytes: 0.01 10*3/uL (ref 0.00–0.07)
Basophils Absolute: 0.1 10*3/uL (ref 0.0–0.1)
Basophils Relative: 1 %
Eosinophils Absolute: 0.5 10*3/uL (ref 0.0–0.5)
Eosinophils Relative: 8 %
HCT: 39.6 % (ref 36.0–46.0)
Hemoglobin: 13.2 g/dL (ref 12.0–15.0)
Immature Granulocytes: 0 %
Lymphocytes Relative: 39 %
Lymphs Abs: 2.4 10*3/uL (ref 0.7–4.0)
MCH: 28.9 pg (ref 26.0–34.0)
MCHC: 33.3 g/dL (ref 30.0–36.0)
MCV: 86.8 fL (ref 80.0–100.0)
Monocytes Absolute: 0.5 10*3/uL (ref 0.1–1.0)
Monocytes Relative: 8 %
Neutro Abs: 2.7 10*3/uL (ref 1.7–7.7)
Neutrophils Relative %: 44 %
Platelets: 233 10*3/uL (ref 150–400)
RBC: 4.56 MIL/uL (ref 3.87–5.11)
RDW: 12.5 % (ref 11.5–15.5)
WBC: 6.1 10*3/uL (ref 4.0–10.5)
nRBC: 0 % (ref 0.0–0.2)

## 2020-11-01 LAB — CARBAMAZEPINE LEVEL, TOTAL: Carbamazepine Lvl: 12.9 ug/mL — ABNORMAL HIGH (ref 4.0–12.0)

## 2020-11-01 MED ORDER — LORAZEPAM 2 MG/ML IJ SOLN
2.0000 mg | Freq: Once | INTRAMUSCULAR | Status: AC
  Administered 2020-11-01: 2 mg via INTRAMUSCULAR
  Filled 2020-11-01: qty 1

## 2020-11-01 NOTE — Progress Notes (Signed)
1:1 Note Pt woke up screaming at the top of her voices, pt insisted that the call bells on the wall was her phone and that she needed to make a phone call. Pt remains on 1:1 observation for safety, will continue to monitor.

## 2020-11-01 NOTE — Progress Notes (Signed)
Patient remains disorganized, attempting to wander in peers rooms, requiring much redirection.  Patient has nonsensical speech and is unable to track in a conversation.  Patient denies SI, HI and AVH.   Assess patient for safety, offer medications as prescribed, engage patient in 1:1 staff talks.   Patient able to maintain safety on the unit with the assistance of a 1:1 staff.

## 2020-11-01 NOTE — Progress Notes (Addendum)
[facility] MD Progress Note  11/01/2020 9:17 AM Paula Dickson  MRN:  [id number] Subjective:  Patient is a 54 year old female with a reported past psychiatric history significant for schizoaffective disorder; bipolar type who originally presented to the [facility] emergency department on 10/25/2020 after her family members had brought her to the hospital because of worsening manic symptoms.  Objective: Chart reviewed- medication complaint, PRN ativan 2 mg IM for agitation this AM at 138 when she woke up this morning screaming insisting that the call bell on her wall was a telephone. Patient is seen and examined.  Patient is a 54 year old female with the above-stated past psychiatric history who is seen in follow-up.  Per documentation, she slept 4.5 hours last night. Patient interviewed in her room. She is sitting on her bed in NAD, she is alert and oriented to location (knows she is in a hospital), although not oriented to time (says it is January 2022 and says it is her sister's bday). Pt gently reoriented and verbalizes understanding. She continues to be disorganized and tangential. Denies SI/HI. When asked about AVH she proceeds to tell a long story about using roach killer to kill herself because of voices, says that this was a long time ago; denies current AVH. She requests to go out and watch TV at the end of our interview. Zyprexa adjusted yesterday- today is the first day she will receive 15 mg/25 mg. Will monitor effect to this adjustment and consider additional adjustments tomorrow.  Again she has the history of hyperthyroidism.  She has a multinodular goiter.  She has at least 2 nodules which appear to require biopsy.  Principal Problem: <principal problem not specified> Diagnosis: Active Problems:   Schizophrenia ([place])  Total Time spent with patient: 20 minutes  Past Psychiatric History: See admission H&P  Past Medical History:  Past Medical History:  Diagnosis Date   . Bipolar affective disorder ([place])   . History of arthritis   . History of chicken pox   . History of depression   . History of genital warts   . history of heart murmur   . History of high blood pressure   . History of thyroid disease   . History of UTI   . Hypertension   . Low TSH level 07/13/2017  . Schizophrenia [facility])     Past Surgical History:  Procedure Laterality Date  . ABLATION ON ENDOMETRIOSIS    . CYST REMOVAL NECK     around 11 years ago /benign  . MULTIPLE TOOTH EXTRACTIONS     Family History:  Family History  Problem Relation Age of Onset  . Arthritis Father   . Hyperlipidemia Father   . High blood pressure Father   . Diabetes Sister   . Diabetes Mother   . Diabetes Brother   . Mental illness Brother   . Alcohol abuse Paternal Uncle   . Alcohol abuse Paternal Grandfather   . Breast cancer Maternal Aunt   . Breast cancer Paternal Aunt   . High blood pressure Sister   . Mental illness Other        runs in family   Family Psychiatric  History: See admission H&P Social History:  Social History   Substance and Sexual Activity  Alcohol Use No     Social History   Substance and Sexual Activity  Drug Use No    Social History   Socioeconomic History  . Marital status: Single    Spouse name: Not on  file  . Number of children: Not on file  . Years of education: Not on file  . Highest education level: Patient refused  Occupational History  . Not on file  Tobacco Use  . Smoking status: Never Smoker  . Smokeless tobacco: Never Used  Vaping Use  . Vaping Use: Never used  Substance and Sexual Activity  . Alcohol use: No  . Drug use: No  . Sexual activity: Not Currently  Other Topics Concern  . Not on file  Social History Narrative  . Not on file   Social Determinants of Health   Financial Resource Strain:   . Difficulty of Paying Living Expenses: Not on file  Food Insecurity:   . Worried About Charity fundraiser in the Last Year: Not on  file  . Ran Out of Food in the Last Year: Not on file  Transportation Needs:   . Lack of Transportation (Medical): Not on file  . Lack of Transportation (Non-Medical): Not on file  Physical Activity:   . Days of Exercise per Week: Not on file  . Minutes of Exercise per Session: Not on file  Stress:   . Feeling of Stress : Not on file  Social Connections:   . Frequency of Communication with Friends and Family: Not on file  . Frequency of Social Gatherings with Friends and Family: Not on file  . Attends Religious Services: Not on file  . Active Member of Clubs or Organizations: Not on file  . Attends [name] Meetings: Not on file  . Marital Status: Not on file   Additional Social History:                         Sleep: Fair  Appetite:  Fair  Current Medications: Current Facility-Administered Medications  Medication Dose Route Frequency Provider Last Rate Last Admin  . acetaminophen (TYLENOL) tablet 650 mg  650 mg Oral Q6H PRN Sharma Covert, MD   650 mg at 10/31/20 2157  . alum & mag hydroxide-simeth (MAALOX/MYLANTA) 200-200-20 MG/5ML suspension 30 mL  30 mL Oral Q4H PRN Sharma Covert, MD      . amLODipine (NORVASC) tablet 10 mg  10 mg Oral Daily Sharma Covert, MD   10 mg at 11/01/20 0916  . benztropine (COGENTIN) tablet 0.5 mg  0.5 mg Oral BID Sharma Covert, MD   0.5 mg at 11/01/20 0916  . haloperidol (HALDOL) tablet 10 mg  10 mg Oral Q6H PRN Sharma Covert, MD   10 mg at 10/30/20 1448   And  . benztropine (COGENTIN) tablet 1 mg  1 mg Oral BID PRN Sharma Covert, MD   1 mg at 10/31/20 2157  . carbamazepine (TEGRETOL) chewable tablet 200 mg  200 mg Oral TID Sharma Covert, MD   200 mg at 11/01/20 0915  . carvedilol (COREG) tablet 25 mg  25 mg Oral BID WC Sharma Covert, MD   25 mg at 10/31/20 1650  . haloperidol (HALDOL) tablet 5 mg  5 mg Oral QHS Sharma Covert, MD   5 mg at 10/31/20 2020  . hydrOXYzine  (ATARAX/VISTARIL) tablet 25 mg  25 mg Oral TID PRN Sharma Covert, MD   25 mg at 11/01/20 0916  . magnesium hydroxide (MILK OF MAGNESIA) suspension 30 mL  30 mL Oral Daily PRN Sharma Covert, MD      . OLANZapine [facility]) injection 10 mg  10  mg Intramuscular Q6H PRN Sharma Covert, MD   10 mg at 10/30/20 1545  . OLANZapine zydis (ZYPREXA) disintegrating tablet 15 mg  15 mg Oral Daily Sharma Covert, MD   15 mg at 11/01/20 0913  . OLANZapine zydis (ZYPREXA) disintegrating tablet 25 mg  25 mg Oral QHS Sharma Covert, MD   25 mg at 10/31/20 2020    Lab Results: No results found for this or any previous visit (from the past 48 hour(s)).  Blood Alcohol level:  Lab Results  Component Value Date   ETH <10 10/25/2020   ETH <10 76/72/0947    Metabolic Disorder Labs: Lab Results  Component Value Date   HGBA1C 6.0 (H) 10/26/2020   MPG 125.5 10/26/2020   MPG 111.15 02/08/2020   Lab Results  Component Value Date   PROLACTIN 11.0 03/14/2019   Lab Results  Component Value Date   CHOL 173 10/26/2020   TRIG 158 (H) 10/26/2020   HDL 46 10/26/2020   CHOLHDL 3.8 10/26/2020   VLDL 32 10/26/2020   LDLCALC 95 10/26/2020   LDLCALC 76 02/08/2020    Physical Findings: AIMS: Facial and Oral Movements Muscles of Facial Expression: None, normal Lips and Perioral Area: None, normal Jaw: None, normal Tongue: None, normal,Extremity Movements Upper (arms, wrists, hands, fingers): None, normal Lower (legs, knees, ankles, toes): None, normal, Trunk Movements Neck, shoulders, hips: None, normal, Overall Severity Severity of abnormal movements (highest score from questions above): None, normal Incapacitation due to abnormal movements: None, normal Patient's awareness of abnormal movements (rate only patient's report): No Awareness, Dental Status Current problems with teeth and/or dentures?: No Does patient usually wear dentures?: No  CIWA:    COWS:      Musculoskeletal: Strength & Muscle Tone: within normal limits Gait & Station: shuffle Patient leans: N/A  Psychiatric Specialty Exam: Physical Exam Vitals and nursing note reviewed. Exam conducted with a chaperone present.  HENT:     Head: Normocephalic and atraumatic.  Pulmonary:     Effort: Pulmonary effort is normal.  Neurological:     General: No focal deficit present.     Mental Status: She is alert.     Review of Systems   Blood pressure 129/88, pulse 87, temperature 97.9 F (36.6 C), temperature source Oral, resp. rate 20, height 5\' 1"  (1.549 m), weight 67.1 kg.Body mass index is 27.96 kg/m.  General Appearance: Disheveled  Eye Contact:  Minimal  Speech:  Pressured  Volume:  Decreased  Mood:  Anxious and Dysphoric  Affect:  Labile  Thought Process:  Disorganized and Descriptions of Associations: Loose  Orientation:  Negative  Thought Content:  Delusions, Paranoid Ideation, Rumination and Tangential  Suicidal Thoughts:  No  Homicidal Thoughts:  No  Memory:  Immediate;   Poor Recent;   Poor Remote;   Poor  Judgement:  Impaired  Insight:  Lacking  Psychomotor Activity:  Increased  Concentration:  Concentration: Poor and Attention Span: Poor  Recall:  Poor  Fund of Knowledge:  Fair  Language:  Fair  Akathisia:  Yes  Handed:  Right  AIMS (if indicated):     Assets:  Desire for Improvement Resilience  ADL's:  Intact  Cognition:  WNL  Sleep:  Number of Hours: 4.75     Treatment Plan Summary: Daily contact with patient to assess and evaluate symptoms and progress in treatment, Medication management and Plan : Patient is seen and examined.  Patient is a 54 year old female with the above-stated past psychiatric history who  is seen in follow-up.   Diagnosis: 1. Schizoaffective disorder; bipolar type 2. Hypertension 3. Hyperthyroidism 4. Diabetes mellitus type 2 5. Multiple thyroid nodules 6. Multinodular goiter  Pertinent findings on examination  today: 1.  Patient remains disorganized, psychotic, tangential. 2.  Sleep is only mildly improved. 3.  Blood pressure is improved 4.  Patient's orientation is suspect.  Plan: 1.  Continue amlodipine 10 mg p.o. daily for hypertension. 2.  Continue Tegretol 200 mg p.o. 3 times daily for mood stability. 3.  Continue carvedilol 25 mg p.o. twice daily with food for hypertension and rate control. 4.  Continue decreased dose of  Haldol to 5 mg p.o. nightly only.  This is for psychosis. 5.  Continue hydroxyzine 25 mg p.o. 3 times daily as needed anxiety. 6.  continue olanzapine to 15 mg p.o. daily and 25 mg p.o. nightly for psychosis and mood stability. Will adjust as appropriate-today 12/6 is the first day she will receive this adjusted dose. 7.  Patient will require biopsy of thyroid nodules after stabilization of mood and improvement in psychosis. 8.  Restart Cogentin 0.5 mg p.o. twice daily for tremor and possible EPS. 9.  CBC with differential, Tegretol level and complete metabolic panel in a.m. tomorrow. 10.  Disposition planning-in progress.   Ival Bible, MD 11/01/2020, 9:17 AM

## 2020-11-01 NOTE — Progress Notes (Signed)
1:1 Note Pt continue to be organized, could not go to sleep. Pt requires constant redirections, to trying to come out of the room and becoming agitated. One time 2 mg of Ativan IM ordered and given as directed, pt then helped to bed with the hope that she will sleep. Remains, will continue to monitor.

## 2020-11-01 NOTE — [facility] Group Notes (Signed)
LCSW Group Therapy Notes  Type of Therapy and Topic: Group Therapy: Healthy Vs. Unhealthy Coping Strategies  Date and Time: 1:00PM  Participation Level: [facility] PARTICIPATION LEVEL: Did Not Attend  Description of Group:  In this group, patients will be encouraged to explore their healthy and unhealthy coping strategics. Coping strategies are actions that we take to deal with stress, problems, or uncomfortable emotions in our daily lives. Each patient will be challenged to read some scenarios and discuss the unhealthy and healthy coping strategies within those scenarios. Also, each patient will be challenged to describe current healthy and unhealthy strategies that they use in their own lives and discuss the outcomes and barriers to those strategies. This group will be process-oriented, with patients participating in exploration of their own experiences as well as giving and receiving support and challenge from other group members.  Therapeutic Goals: 1. Patient will identify personal healthy and unhealthy coping strategies. 2. Patient will identify healthy and unhealthy coping strategies, in others, through scenarios.  3. Patient will identify expected outcomes of healthy and unhealthy coping strategies. 4. Patient will identify barriers to using healthy coping strategies.   Summary of Patient Progress: Patient was unable to attend due to being on Unit Restriction and group being held outside.    Therapeutic Modalities:  Cognitive Behavioral Therapy Solution Focused Therapy Motivational Interviewing   Darletta Moll MSW, [place] Worker  [facility]

## 2020-11-01 NOTE — Tx Team (Signed)
Interdisciplinary Treatment and Diagnostic Plan Update  11/01/2020 Time of Session: 9:10am Paula Dickson MRN: [id number]  Principal Diagnosis: <principal problem not specified>  Secondary Diagnoses: Active Problems:   Schizophrenia ([place])   Current Medications:  Current Facility-Administered Medications  Medication Dose Route Frequency Provider Last Rate Last Admin  . acetaminophen (TYLENOL) tablet 650 mg  650 mg Oral Q6H PRN Sharma Covert, MD   650 mg at 10/31/20 2157  . alum & mag hydroxide-simeth (MAALOX/MYLANTA) 200-200-20 MG/5ML suspension 30 mL  30 mL Oral Q4H PRN Sharma Covert, MD      . amLODipine (NORVASC) tablet 10 mg  10 mg Oral Daily Sharma Covert, MD   10 mg at 11/01/20 0916  . benztropine (COGENTIN) tablet 0.5 mg  0.5 mg Oral BID Sharma Covert, MD   0.5 mg at 11/01/20 0916  . haloperidol (HALDOL) tablet 10 mg  10 mg Oral Q6H PRN Sharma Covert, MD   10 mg at 10/30/20 1448   And  . benztropine (COGENTIN) tablet 1 mg  1 mg Oral BID PRN Sharma Covert, MD   1 mg at 10/31/20 2157  . carbamazepine (TEGRETOL) chewable tablet 200 mg  200 mg Oral TID Sharma Covert, MD   200 mg at 11/01/20 0915  . carvedilol (COREG) tablet 25 mg  25 mg Oral BID WC Sharma Covert, MD   25 mg at 10/31/20 1650  . haloperidol (HALDOL) tablet 5 mg  5 mg Oral QHS Sharma Covert, MD   5 mg at 10/31/20 2020  . hydrOXYzine (ATARAX/VISTARIL) tablet 25 mg  25 mg Oral TID PRN Sharma Covert, MD   25 mg at 11/01/20 0916  . magnesium hydroxide (MILK OF MAGNESIA) suspension 30 mL  30 mL Oral Daily PRN Sharma Covert, MD      . OLANZapine [facility]) injection 10 mg  10 mg Intramuscular Q6H PRN Sharma Covert, MD   10 mg at 10/30/20 1545  . OLANZapine zydis (ZYPREXA) disintegrating tablet 15 mg  15 mg Oral Daily Sharma Covert, MD   15 mg at 11/01/20 0913  . OLANZapine zydis (ZYPREXA) disintegrating tablet 25 mg  25 mg Oral QHS Sharma Covert, MD   25  mg at 10/31/20 2020   PTA Medications: Medications Prior to Admission  Medication Sig Dispense Refill Last Dose  . amLODipine (NORVASC) 10 MG tablet Take 10 mg by mouth daily.   Past Week at Unknown time  . benztropine (COGENTIN) 1 MG tablet Take 1 tablet (1 mg total) by mouth 2 (two) times daily. For prevention of drug induced tremors 60 tablet 0   . carbamazepine (TEGRETOL) 100 MG chewable tablet Take 1 tablet (100 mg) by mouth in the morning & 2 tablets at bedtime: For mood stabilization 60 tablet 0   . carvedilol (COREG) 25 MG tablet Take 1 tablet (25 mg total) by mouth 2 (two) times daily with a meal. 60 tablet 2   . haloperidol (HALDOL) 10 MG tablet Take 1 tablet (10 mg total) by mouth at bedtime. For mood control 30 tablet 0   . haloperidol decanoate (HALDOL DECANOATE) 100 MG/ML injection Inject 1.5 mLs (150 mg total) into the muscle every 30 (thirty) days. (Due on 02/27/20): For mood control (Patient not taking: Reported on 10/26/2020) 1 mL 0 Not Taking  . traZODone (DESYREL) 50 MG tablet Take 1 tablet by mouth at bedtime as needed.       Patient Stressors: Museum/gallery curator  difficulties Health problems Medication change or noncompliance  Patient Strengths: Active sense of humor  Treatment Modalities: Medication Management, Group therapy, Case management,  1 to 1 session with clinician, Psychoeducation, Recreational therapy.   Physician Treatment Plan for Primary Diagnosis: <principal problem not specified> Long Term Goal(s): Improvement in symptoms so as ready for discharge Improvement in symptoms so as ready for discharge   Short Term Goals: Ability to identify changes in lifestyle to reduce recurrence of condition will improve Ability to verbalize feelings will improve Ability to demonstrate self-control will improve Ability to identify and develop effective coping behaviors will improve Ability to maintain clinical measurements within normal limits will improve Compliance with  prescribed medications will improve Ability to identify changes in lifestyle to reduce recurrence of condition will improve Ability to verbalize feelings will improve Ability to demonstrate self-control will improve Ability to identify and develop effective coping behaviors will improve Ability to maintain clinical measurements within normal limits will improve Compliance with prescribed medications will improve  Medication Management: Evaluate patient's response, side effects, and tolerance of medication regimen.  Therapeutic Interventions: 1 to 1 sessions, Unit Group sessions and Medication administration.  Evaluation of Outcomes: Not Progressing  Physician Treatment Plan for Secondary Diagnosis: Active Problems:   Schizophrenia ([place])  Long Term Goal(s): Improvement in symptoms so as ready for discharge Improvement in symptoms so as ready for discharge   Short Term Goals: Ability to identify changes in lifestyle to reduce recurrence of condition will improve Ability to verbalize feelings will improve Ability to demonstrate self-control will improve Ability to identify and develop effective coping behaviors will improve Ability to maintain clinical measurements within normal limits will improve Compliance with prescribed medications will improve Ability to identify changes in lifestyle to reduce recurrence of condition will improve Ability to verbalize feelings will improve Ability to demonstrate self-control will improve Ability to identify and develop effective coping behaviors will improve Ability to maintain clinical measurements within normal limits will improve Compliance with prescribed medications will improve     Medication Management: Evaluate patient's response, side effects, and tolerance of medication regimen.  Therapeutic Interventions: 1 to 1 sessions, Unit Group sessions and Medication administration.  Evaluation of Outcomes: Not Progressing   RN Treatment  Plan for Primary Diagnosis: <principal problem not specified> Long Term Goal(s): Knowledge of disease and therapeutic regimen to maintain health will improve  Short Term Goals: Ability to remain free from injury will improve, Ability to verbalize frustration and anger appropriately will improve, Ability to demonstrate self-control, Ability to participate in decision making will improve, Ability to verbalize feelings will improve, Ability to identify and develop effective coping behaviors will improve and Compliance with prescribed medications will improve  Medication Management: RN will administer medications as ordered by provider, will assess and evaluate patient's response and provide education to patient for prescribed medication. RN will report any adverse and/or side effects to prescribing provider.  Therapeutic Interventions: 1 on 1 counseling sessions, Psychoeducation, Medication administration, Evaluate responses to treatment, Monitor vital signs and CBGs as ordered, Perform/monitor CIWA, COWS, AIMS and Fall Risk screenings as ordered, Perform wound care treatments as ordered.  Evaluation of Outcomes: Not Progressing   LCSW Treatment Plan for Primary Diagnosis: <principal problem not specified> Long Term Goal(s): Safe transition to appropriate next level of care at discharge, Engage patient in therapeutic group addressing interpersonal concerns.  Short Term Goals: Engage patient in aftercare planning with referrals and resources, Increase social support, Increase ability to appropriately verbalize feelings, Increase emotional regulation,  Identify triggers associated with mental health/substance abuse issues and Increase skills for wellness and recovery  Therapeutic Interventions: Assess for all discharge needs, 1 to 1 time with Social worker, Explore available resources and support systems, Assess for adequacy in community support network, Educate family and significant other(s) on suicide  prevention, Complete Psychosocial Assessment, Interpersonal group therapy.  Evaluation of Outcomes: Not Progressing   Progress in Treatment: Attending groups: Yes. and No. Participating in groups: Yes. and No. Taking medication as prescribed: Yes. Toleration medication: Yes. Family/Significant other contact made: No, will contact:  if consent is provided Patient understands diagnosis: Yes. Discussing patient identified problems/goals with staff: Yes. Medical problems stabilized or resolved: Yes. Denies suicidal/homicidal ideation: Yes. Issues/concerns per patient self-inventory: No.   New problem(s) identified: Yes, Describe:  Patient placed on 1:1 for safety   New Short Term/Long Term Goal(s): medication stabilization, elimination of SI thoughts, development of comprehensive mental wellness plan.   Patient Goals:  "To move forward"  Discharge Plan or Barriers: Patient is followed by [facility] for medication management. CSW to continue to assess housing for this patient.   Reason for Continuation of Hospitalization: Medication stabilization Other; describe agitation, insomnia, anhedonia   Estimated Length of Stay: 3-5 days  Attendees: Patient:  10/27/2020   Physician:  10/27/2020   Nursing:  10/27/2020   RN Care Manager: 10/27/2020  Social Worker: Darletta Moll, LCSW 10/27/2020   Recreational Therapist:  10/27/2020   Other:  10/27/2020  Other:  10/27/2020   Other: 10/27/2020       Scribe for Treatment Team: Vassie Moselle, LCSW 11/01/2020 9:42 AM

## 2020-11-01 NOTE — Progress Notes (Signed)
1:1 Note Pt is currently passing in the room. Pt observed mumbling words which are hard to  Understand. Pt medicated with her Schedule  Medications, went to bed for a few minutes after that but woke up and started pacing again. Pt remains safe at this time, 1:1 observation continue, will continue to monitor.

## 2020-11-02 MED ORDER — HALOPERIDOL 5 MG PO TABS
10.0000 mg | ORAL_TABLET | Freq: Two times a day (BID) | ORAL | Status: DC
  Administered 2020-11-02 – 2020-11-03 (×2): 10 mg via ORAL
  Filled 2020-11-02 (×4): qty 2

## 2020-11-02 MED ORDER — BENZTROPINE MESYLATE 1 MG PO TABS
1.0000 mg | ORAL_TABLET | Freq: Two times a day (BID) | ORAL | Status: DC
  Administered 2020-11-02 – 2020-11-12 (×20): 1 mg via ORAL
  Filled 2020-11-02 (×24): qty 1

## 2020-11-02 MED ORDER — CARBAMAZEPINE 100 MG PO CHEW
150.0000 mg | CHEWABLE_TABLET | Freq: Three times a day (TID) | ORAL | Status: DC
  Administered 2020-11-02 – 2020-11-12 (×30): 150 mg via ORAL
  Filled 2020-11-02 (×16): qty 1.5
  Filled 2020-11-02: qty 2
  Filled 2020-11-02 (×15): qty 1.5
  Filled 2020-11-02: qty 2
  Filled 2020-11-02: qty 1.5
  Filled 2020-11-02: qty 2
  Filled 2020-11-02 (×3): qty 1.5

## 2020-11-02 NOTE — Progress Notes (Signed)
Adult Psychoeducational Group Note  Date:  11/02/2020 Time:  1:26 AM  Group Topic/Focus:  Wrap-Up Group:   The focus of this group is to help patients review their daily goal of treatment and discuss progress on daily workbooks.  Participation Level:  Did Not Attend  Participation Quality:  Did Not Attend  Affect:  Did Not Attend  Cognitive:  Did Not Attend  Insight: None  Engagement in Group:  Did Not Attend  Modes of Intervention:  Did Not Attend  Additional Comments:  Pt did not attend evening wrap up group tonight.  [name] 11/02/2020, 1:26 AM

## 2020-11-02 NOTE — Progress Notes (Signed)
Nursing 1:1 note D:Pt observed laying in bed with eyes closed. RR even and unlabored. No distress noted. A: 1:1 observation continues for safety  R: pt remains safe

## 2020-11-02 NOTE — Progress Notes (Signed)
Adult Psychoeducational Group Note  Date:  11/02/2020 Time:  10:09 PM  Group Topic/Focus:  Wrap-Up Group:   The focus of this group is to help patients review their daily goal of treatment and discuss progress on daily workbooks.  Participation Level:  Minimal  Participation Quality:  Appropriate  Affect:  Anxious  Cognitive:  Disorganized and Confused  Insight: Lacking and Limited  Engagement in Group:  Lacking and Limited  Modes of Intervention:  Discussion  Additional Comments:  Pt stated her goal for today was to focus on her treatment plan. Pt stated she felt she accomplished her goal today. Pt stated she talk with her doctor and social worker, regarding her care today. Pt stated she took all her medication today from her providers.  Pt rated her overall day a 8 out of 10 today. Pt stated she felt better about herself today. Pt stated that she enjoyed going outside for recreation today. Pt stated her appetite was pretty good today and she was brought back all meals today because she is on a 1:1 for safety. Pt stated her sleep last night was good. Pt stated the goal for tonight was to get some rest. Pt stated she was in some physical pain tonight.  Pt states she had pain issues in both her left and right breast. Pt said her overall pain level was a 5.Pt nurse was updated on situation. Pt deny auditory or visual hallucinations. Pt denies thoughts of harming herself or others. Pt stated she would alert staff if anything changes  [name] 11/02/2020, 10:09 PM

## 2020-11-02 NOTE — Progress Notes (Signed)
1:1 note  Pt has been reclusive to their room most of the room. Pt refused to attend afternoon groups. Pt seems more redirectable this afternoon. Pt is more pleasant. Pt denies any physical complaints or pain. Pt safe on the unit. q105m safety checks implemented and continued. Will continue to monitor. 1:1 continues for safety. Will continue to monitor.

## 2020-11-02 NOTE — Progress Notes (Signed)
[facility] MD Progress Note  11/02/2020 1:00 PM [place]  MRN:  [id number] Subjective:  Patient is a 54 year old female with a reported past psychiatric history significant for schizoaffective disorder; bipolar type who originally presented to the [facility] emergency department on 10/25/2020 after her family members had brought her to the hospital because of worsening manic symptoms.  Objective: Chart reviewed- medication complaint, she received 30 mg of PRN haldol over the last 24 hours.  Patient interviewed this AM in her room; she remains paranoid, psychotic, disorganized. She is irritable with orientation questions but is aware it is Decemeber, 2021; she is somewhat argumentative when asked about location. Upon introduction she states that she does not know who I am or recall meeting me yesterday. Denies SI/HI/AVH. Carbamazepine level slightly elevated 12.9 (12 is the upper limits for therapeutic effect per recommendations); will decrease dose while increasing haldol dose.   Again she has the history of hyperthyroidism.  She has a multinodular goiter.  She has at least 2 nodules which appear to require biopsy.  Principal Problem: <principal problem not specified> Diagnosis: Active Problems:   Schizophrenia ([place])  Total Time spent with patient: 20 minutes  Past Psychiatric History: See admission H&P  Past Medical History:  Past Medical History:  Diagnosis Date  . Bipolar affective disorder ([place])   . History of arthritis   . History of chicken pox   . History of depression   . History of genital warts   . history of heart murmur   . History of high blood pressure   . History of thyroid disease   . History of UTI   . Hypertension   . Low TSH level 07/13/2017  . Schizophrenia [facility])     Past Surgical History:  Procedure Laterality Date  . ABLATION ON ENDOMETRIOSIS    . CYST REMOVAL NECK     around 11 years ago /benign  . MULTIPLE TOOTH EXTRACTIONS      Family History:  Family History  Problem Relation Age of Onset  . Arthritis Father   . Hyperlipidemia Father   . High blood pressure Father   . Diabetes Sister   . Diabetes Mother   . Diabetes Brother   . Mental illness Brother   . Alcohol abuse Paternal Uncle   . Alcohol abuse Paternal Grandfather   . Breast cancer Maternal Aunt   . Breast cancer Paternal Aunt   . High blood pressure Sister   . Mental illness Other        runs in family   Family Psychiatric  History: See admission H&P Social History:  Social History   Substance and Sexual Activity  Alcohol Use No     Social History   Substance and Sexual Activity  Drug Use No    Social History   Socioeconomic History  . Marital status: Single    Spouse name: Not on file  . Number of children: Not on file  . Years of education: Not on file  . Highest education level: Patient refused  Occupational History  . Not on file  Tobacco Use  . Smoking status: Never Smoker  . Smokeless tobacco: Never Used  Vaping Use  . Vaping Use: Never used  Substance and Sexual Activity  . Alcohol use: No  . Drug use: No  . Sexual activity: Not Currently  Other Topics Concern  . Not on file  Social History Narrative  . Not on file   Social Determinants of Health  Financial Resource Strain:   . Difficulty of Paying Living Expenses: Not on file  Food Insecurity:   . Worried About Charity fundraiser in the Last Year: Not on file  . Ran Out of Food in the Last Year: Not on file  Transportation Needs:   . Lack of Transportation (Medical): Not on file  . Lack of Transportation (Non-Medical): Not on file  Physical Activity:   . Days of Exercise per Week: Not on file  . Minutes of Exercise per Session: Not on file  Stress:   . Feeling of Stress : Not on file  Social Connections:   . Frequency of Communication with Friends and Family: Not on file  . Frequency of Social Gatherings with Friends and Family: Not on file  .  Attends Religious Services: Not on file  . Active Member of Clubs or Organizations: Not on file  . Attends [name] Meetings: Not on file  . Marital Status: Not on file   Additional Social History:                         Sleep: Fair  Appetite:  Fair  Current Medications: Current Facility-Administered Medications  Medication Dose Route Frequency Provider Last Rate Last Admin  . acetaminophen (TYLENOL) tablet 650 mg  650 mg Oral Q6H PRN Sharma Covert, MD   650 mg at 10/31/20 2157  . alum & mag hydroxide-simeth (MAALOX/MYLANTA) 200-200-20 MG/5ML suspension 30 mL  30 mL Oral Q4H PRN Sharma Covert, MD      . amLODipine (NORVASC) tablet 10 mg  10 mg Oral Daily Sharma Covert, MD   10 mg at 11/02/20 0725  . benztropine (COGENTIN) tablet 0.5 mg  0.5 mg Oral BID Sharma Covert, MD   0.5 mg at 11/02/20 0724  . haloperidol (HALDOL) tablet 10 mg  10 mg Oral Q6H PRN Sharma Covert, MD   10 mg at 11/02/20 9741   And  . benztropine (COGENTIN) tablet 1 mg  1 mg Oral BID PRN Sharma Covert, MD   1 mg at 10/31/20 2157  . carbamazepine (TEGRETOL) chewable tablet 200 mg  200 mg Oral TID Sharma Covert, MD   200 mg at 11/02/20 1142  . carvedilol (COREG) tablet 25 mg  25 mg Oral BID WC Sharma Covert, MD   25 mg at 11/02/20 0725  . haloperidol (HALDOL) tablet 5 mg  5 mg Oral QHS Sharma Covert, MD   5 mg at 11/01/20 2035  . hydrOXYzine (ATARAX/VISTARIL) tablet 25 mg  25 mg Oral TID PRN Sharma Covert, MD   25 mg at 11/02/20 0724  . magnesium hydroxide (MILK OF MAGNESIA) suspension 30 mL  30 mL Oral Daily PRN Sharma Covert, MD      . OLANZapine [facility]) injection 10 mg  10 mg Intramuscular Q6H PRN Sharma Covert, MD   10 mg at 11/02/20 0155  . OLANZapine zydis (ZYPREXA) disintegrating tablet 15 mg  15 mg Oral Daily Sharma Covert, MD   15 mg at 11/02/20 1009  . OLANZapine zydis (ZYPREXA) disintegrating tablet 25 mg  25 mg Oral QHS  Sharma Covert, MD   25 mg at 11/01/20 2034    Lab Results:  Results for orders placed or performed during the hospital encounter of 10/26/20 (from the past 48 hour(s))  CBC with Differential/Platelet     Status: None   Collection Time: 11/01/20  5:57 PM  Result Value Ref Range   WBC 6.1 4.0 - 10.5 K/uL   RBC 4.56 3.87 - 5.11 MIL/uL   Hemoglobin 13.2 12.0 - 15.0 g/dL   HCT 39.6 36 - 46 %   MCV 86.8 80.0 - 100.0 fL   MCH 28.9 26.0 - 34.0 pg   MCHC 33.3 30.0 - 36.0 g/dL   RDW 12.5 11.5 - 15.5 %   Platelets 233 150 - 400 K/uL   nRBC 0.0 0.0 - 0.2 %   Neutrophils Relative % 44 %   Neutro Abs 2.7 1.7 - 7.7 K/uL   Lymphocytes Relative 39 %   Lymphs Abs 2.4 0.7 - 4.0 K/uL   Monocytes Relative 8 %   Monocytes Absolute 0.5 0.1 - 1.0 K/uL   Eosinophils Relative 8 %   Eosinophils Absolute 0.5 0.0 - 0.5 K/uL   Basophils Relative 1 %   Basophils Absolute 0.1 0.0 - 0.1 K/uL   Immature Granulocytes 0 %   Abs Immature Granulocytes 0.01 0.00 - 0.07 K/uL    Comment: Performed at [facility], [place] [address], [place], [place] [address]  Comprehensive metabolic panel     Status: Abnormal   Collection Time: 11/01/20  5:57 PM  Result Value Ref Range   Sodium 140 135 - 145 mmol/L   Potassium 4.0 3.5 - 5.1 mmol/L   Chloride 104 98 - 111 mmol/L   CO2 27 22 - 32 mmol/L   Glucose, Bld 124 (H) 70 - 99 mg/dL    Comment: Glucose reference range applies only to samples taken after fasting for at least 8 hours.   BUN 27 (H) 6 - 20 mg/dL   Creatinine, Ser 1.06 (H) 0.44 - 1.00 mg/dL   Calcium 9.7 8.9 - 10.3 mg/dL   Total Protein 7.2 6.5 - 8.1 g/dL   Albumin 4.5 3.5 - 5.0 g/dL   AST 15 15 - 41 U/L   ALT 17 0 - 44 U/L   Alkaline Phosphatase 82 38 - 126 U/L   Total Bilirubin 0.3 0.3 - 1.2 mg/dL   GFR, Estimated >60 >60 mL/min    Comment: (NOTE) Calculated using the CKD-EPI Creatinine Equation (2021)    Anion gap 9 5 - 15    Comment: Performed at [facility],  [place] [address] [place], [place] [address]  Carbamazepine level, total     Status: Abnormal   Collection Time: 11/01/20  5:57 PM  Result Value Ref Range   Carbamazepine Lvl 12.9 (H) 4.0 - 12.0 ug/mL    Comment: Performed at [place] [address] [place], [place] [address]    Blood Alcohol level:  Lab Results  Component Value Date   [facility] <10 10/25/2020   ETH <10 42/35/3614    Metabolic Disorder Labs: Lab Results  Component Value Date   HGBA1C 6.0 (H) 10/26/2020   MPG 125.5 10/26/2020   MPG 111.15 02/08/2020   Lab Results  Component Value Date   PROLACTIN 11.0 03/14/2019   Lab Results  Component Value Date   CHOL 173 10/26/2020   TRIG 158 (H) 10/26/2020   HDL 46 10/26/2020   CHOLHDL 3.8 10/26/2020   VLDL 32 10/26/2020   LDLCALC 95 10/26/2020   LDLCALC 76 02/08/2020    Physical Findings: AIMS: Facial and Oral Movements Muscles of Facial Expression: None, normal Lips and Perioral Area: None, normal Jaw: None, normal Tongue: None, normal,Extremity Movements Upper (arms, wrists, hands, fingers): None, normal Lower (legs,  knees, ankles, toes): None, normal, Trunk Movements Neck, shoulders, hips: None, normal, Overall Severity Severity of abnormal movements (highest score from questions above): None, normal Incapacitation due to abnormal movements: None, normal Patient's awareness of abnormal movements (rate only patient's report): No Awareness, Dental Status Current problems with teeth and/or dentures?: No Does patient usually wear dentures?: No  CIWA:    COWS:     Musculoskeletal: Strength & Muscle Tone: within normal limits Gait & Station: shuffle Patient leans: N/A  Psychiatric Specialty Exam: Physical Exam Vitals and nursing note reviewed. Exam conducted with a chaperone present.  HENT:     Head: Normocephalic and atraumatic.  Pulmonary:     Effort: Pulmonary effort is normal.  Neurological:     General: No focal deficit present.      Mental Status: She is alert.     Review of Systems   Blood pressure (!) 137/105, pulse 90, temperature 98 F (36.7 C), temperature source Oral, resp. rate 20, height 5\' 1"  (1.549 m), weight 67.1 kg.Body mass index is 27.96 kg/m.  General Appearance: Disheveled  Eye Contact:  Minimal  Speech:  Pressured  Volume:  Decreased  Mood:  Anxious and Dysphoric  Affect:  Labile  Thought Process:  Disorganized and Descriptions of Associations: Loose  Orientation:  Negative  Thought Content:  Delusions, Paranoid Ideation, Rumination and Tangential  Suicidal Thoughts:  No  Homicidal Thoughts:  No  Memory:  Immediate;   Poor Recent;   Poor Remote;   Poor  Judgement:  Impaired  Insight:  Lacking  Psychomotor Activity:  Increased  Concentration:  Concentration: Poor and Attention Span: Poor  Recall:  Poor  Fund of Knowledge:  Fair  Language:  Fair  Akathisia:  Yes  Handed:  Right  AIMS (if indicated):     Assets:  Desire for Improvement Resilience  ADL's:  Intact  Cognition:  WNL  Sleep:  Number of Hours: 4.75     Treatment Plan Summary: Daily contact with patient to assess and evaluate symptoms and progress in treatment, Medication management and Plan : Patient is seen and examined.  Patient is a 54 year old female with the above-stated past psychiatric history who is seen in follow-up.   Diagnosis: 1. Schizoaffective disorder; bipolar type 2. Hypertension 3. Hyperthyroidism 4. Diabetes mellitus type 2 5. Multiple thyroid nodules 6. Multinodular goiter  Pertinent findings on examination today: 1.  Patient remains disorganized, psychotic, tangential. 2.  Sleep is only mildly improved. 3. Carbamazepine level 12.9- mildly elevated, will decrease dose slightly while increasing haldol as ongoing psychosis- received 30 mg PRN haldol in the last 24 hours for agitation.    Plan: 1.  Continue amlodipine 10 mg p.o. daily for hypertension. 2.  Decrease Tegretol 150 mg p.o. 3  times daily for mood stability as level slightly elevated. 3.  Continue carvedilol 25 mg p.o. twice daily with food for hypertension and rate control. 4.  Increase dose of  Haldol to 10 mg BID p.o. nightly only.  This is for psychosis. 5.  Continue hydroxyzine 25 mg p.o. 3 times daily as needed anxiety. 6.  continue olanzapine to 15 mg p.o. daily and 25 mg p.o. nightly for psychosis and mood stabilityse. 7.  Patient will require biopsy of thyroid nodules after stabilization of mood and improvement in psychosis. 8.  Increase Cogentin 1  mg p.o. twice daily for tremor and possible EPS 9.  CBC with differential, Tegretol level and complete metabolic panel in a.m. tomorrow. 10.  Disposition planning-in progress.  Ival Bible, MD 11/02/2020, 1:00 PM

## 2020-11-02 NOTE — Progress Notes (Signed)
CI rounded on 500 hall Pt and CI checked in, pt was able to tell CI the date and that she was feeling well but that was it  CI will follow up if needed   [facility] Intern at [facility]

## 2020-11-02 NOTE — Progress Notes (Signed)
1:1 note  Pt continues to become agitated at night. Pt threw their tray in their room. Pt then stormed to the dayroom and interrupted the milieu. Pt was calmed down by mht with verbal deesculation. Pt became tearful and then went back to their room. Pt was compliant with medication administration. Seems to become agitated like this and demonstrates this pattern of behavior daily at dinner time. Pt safe on the unit. q74m safety checks implemented and continued. Will continue to monitor. 1:1 continues for safety.

## 2020-11-02 NOTE — Progress Notes (Addendum)
   11/02/20 2213  Psych Admission Type (Psych Patients Only)  Admission Status Voluntary  Psychosocial Assessment  Patient Complaints Other (Comment) (breast pain)  Eye Contact Intense;Watchful  Facial Expression Anxious;Pensive  Affect Anxious  Speech Rapid;Pressured;Tangential;Rhyming;Slurred  Interaction Assertive;Childlike;Intrusive  Motor Activity Fidgety;Restless;Unsteady  Appearance/Hygiene In scrubs;Layered clothes;Disheveled  Behavior Characteristics Cooperative;Anxious  Mood Anxious;Labile;Pleasant  Thought Process  Coherency Disorganized;Flight of ideas  Content Obsessions;Paranoia  Delusions Paranoid  Perception Derealization  Hallucination None reported or observed  Judgment Limited  Confusion Mild  Danger to Self  Current suicidal ideation? Denies  Danger to Others  Danger to Others None reported or observed   Pt pleasant. Denies SI, HI, AVH. States she has pain in her breasts that she rates 5/10. Says that it hurts when she lifts her arms. Pt refused any pain medicine. Pt has outbursts of laughter for no reason. Pt still on 1:1 for safety.

## 2020-11-02 NOTE — Progress Notes (Signed)
   11/02/20 0724  Psych Admission Type (Psych Patients Only)  Admission Status Voluntary  Psychosocial Assessment  Patient Complaints Anxiety;Disorientation;Irritability;Restlessness  Eye Contact Intense;Watchful  Facial Expression Anxious;Pensive  Affect Angry;Anxious;Irritable  Speech Rapid;Pressured;Slurred;Tangential;Rhyming  Interaction Attention-seeking;Demanding;Dominating;Hostile;Intrusive  Motor Activity Fidgety;Restless;Unsteady  Appearance/Hygiene Body odor;Disheveled;Poor hygiene  Behavior Characteristics Agressive verbally;Anxious;Irritable  Mood Anxious;Labile;Suspicious;Irritable  Thought Process  Coherency Disorganized;Flight of ideas  Content Obsessions;Paranoia  Delusions Paranoid  Perception Derealization  Hallucination None reported or observed  Judgment Limited  Confusion Moderate  Danger to Self  Current suicidal ideation? Denies  Danger to Others  Danger to Others None reported or observed

## 2020-11-02 NOTE — Progress Notes (Signed)
Patient remains on 1:1 with sitter at the bedside for safety. Patient requires frequent redirection to stay on task , she  remains disorganized and tangential. She required injection x1 for agitation.  Patient denies SI, HI and AVH.

## 2020-11-02 NOTE — Plan of Care (Signed)
1:1 note  D: Pt continues to be disorganized, disoriented, to time/date, and intrusive with others. Pt can be labile especially with redirection. Pt given encouragement to provide proper self care with no evidence of learning. Pt denies any physical complaints or pain. Pt does seem anxious at times. Pt also seems paranoid with medication administration still. Pt denies si/hi/ah/vh and verbally agrees to approach staff if these become apparent or before harming themselves/others while at [place].  A: Pt provided support and encouragement. Pt given medication per protocol and standing orders. Q85m safety checks implemented and continued. 1:1 continues for safety.  R: Pt safe on the unit. Will continue to monitor.   Problem: Health Behavior/Discharge Planning: Goal: Identification of resources available to assist in meeting health care needs will improve Outcome: Progressing   Problem: Physical Regulation: Goal: Ability to maintain clinical measurements within normal limits will improve Outcome: Progressing   Problem: Safety: Goal: Periods of time without injury will increase Outcome: Progressing

## 2020-11-03 MED ORDER — LORAZEPAM 1 MG PO TABS
2.0000 mg | ORAL_TABLET | Freq: Once | ORAL | Status: AC
  Administered 2020-11-03: 2 mg via ORAL
  Filled 2020-11-03: qty 2

## 2020-11-03 MED ORDER — HALOPERIDOL 5 MG PO TABS
15.0000 mg | ORAL_TABLET | Freq: Two times a day (BID) | ORAL | Status: DC
  Administered 2020-11-03 – 2020-11-04 (×2): 15 mg via ORAL
  Filled 2020-11-03 (×4): qty 3

## 2020-11-03 NOTE — Progress Notes (Signed)
Nursing 1:1 note D:Pt observed at nurse's station with sitter. Pt hard to redirect at this time. Pt talking about having to go to the Upper Room. Pt will be given medication. A: 1:1 observation continues for safety  R: pt remains safe

## 2020-11-03 NOTE — Progress Notes (Signed)
[facility] MD Progress Note  11/03/2020 9:57 AM Paula Dickson  MRN:  [id number] Subjective:  Patient is a 54 year old female with a reported past psychiatric history significant for schizoaffective disorder; bipolar type who originally presented to the [facility] emergency department on 10/25/2020 after her family members had brought her to the hospital because of worsening manic symptoms.  Objective: Chart reviewed- medication complaint, she received 10 mg of PRN haldol, 10 mg PRN syprexa and 2 mg PRN ativan over the last 24 hours.  Patient interviewed this AM in the hallway; she remains paranoid, psychotic, disorganized. She is irritable and demanding during interaction. She initially states that she does not remember meeting me, but then states she does and informs me that "you're not my authority" and requests that I not ask her any more questions and she walks away. After taking a couple steps, patient turns around and asks if she has a court date and then goes on a tangent about orthodontist appointments, braces and becomes increasingly disorganized.  Pt receiving less PRNs with increased haldol dose; will further increase haldol dose.   Again she has the history of hyperthyroidism.  She has a multinodular goiter.  She has at least 2 nodules which appear to require biopsy.  Principal Problem: <principal problem not specified> Diagnosis: Active Problems:   Schizophrenia ([place])  Total Time spent with patient: 20 minutes  Past Psychiatric History: See admission H&P  Past Medical History:  Past Medical History:  Diagnosis Date  . Bipolar affective disorder ([place])   . History of arthritis   . History of chicken pox   . History of depression   . History of genital warts   . history of heart murmur   . History of high blood pressure   . History of thyroid disease   . History of UTI   . Hypertension   . Low TSH level 07/13/2017  . Schizophrenia [facility])     Past  Surgical History:  Procedure Laterality Date  . ABLATION ON ENDOMETRIOSIS    . CYST REMOVAL NECK     around 11 years ago /benign  . MULTIPLE TOOTH EXTRACTIONS     Family History:  Family History  Problem Relation Age of Onset  . Arthritis Father   . Hyperlipidemia Father   . High blood pressure Father   . Diabetes Sister   . Diabetes Mother   . Diabetes Brother   . Mental illness Brother   . Alcohol abuse Paternal Uncle   . Alcohol abuse Paternal Grandfather   . Breast cancer Maternal Aunt   . Breast cancer Paternal Aunt   . High blood pressure Sister   . Mental illness Other        runs in family   Family Psychiatric  History: See admission H&P Social History:  Social History   Substance and Sexual Activity  Alcohol Use No     Social History   Substance and Sexual Activity  Drug Use No    Social History   Socioeconomic History  . Marital status: Single    Spouse name: Not on file  . Number of children: Not on file  . Years of education: Not on file  . Highest education level: Patient refused  Occupational History  . Not on file  Tobacco Use  . Smoking status: Never Smoker  . Smokeless tobacco: Never Used  Vaping Use  . Vaping Use: Never used  Substance and Sexual Activity  . Alcohol use: No  .  Drug use: No  . Sexual activity: Not Currently  Other Topics Concern  . Not on file  Social History Narrative  . Not on file   Social Determinants of Health   Financial Resource Strain:   . Difficulty of Paying Living Expenses: Not on file  Food Insecurity:   . Worried About Charity fundraiser in the Last Year: Not on file  . Ran Out of Food in the Last Year: Not on file  Transportation Needs:   . Lack of Transportation (Medical): Not on file  . Lack of Transportation (Non-Medical): Not on file  Physical Activity:   . Days of Exercise per Week: Not on file  . Minutes of Exercise per Session: Not on file  Stress:   . Feeling of Stress : Not on file   Social Connections:   . Frequency of Communication with Friends and Family: Not on file  . Frequency of Social Gatherings with Friends and Family: Not on file  . Attends Religious Services: Not on file  . Active Member of Clubs or Organizations: Not on file  . Attends [name] Meetings: Not on file  . Marital Status: Not on file   Additional Social History:                         Sleep: Fair  Appetite:  Fair  Current Medications: Current Facility-Administered Medications  Medication Dose Route Frequency Provider Last Rate Last Admin  . acetaminophen (TYLENOL) tablet 650 mg  650 mg Oral Q6H PRN Sharma Covert, MD   650 mg at 10/31/20 2157  . alum & mag hydroxide-simeth (MAALOX/MYLANTA) 200-200-20 MG/5ML suspension 30 mL  30 mL Oral Q4H PRN Sharma Covert, MD      . amLODipine (NORVASC) tablet 10 mg  10 mg Oral Daily Sharma Covert, MD   10 mg at 11/03/20 0756  . benztropine (COGENTIN) tablet 1 mg  1 mg Oral BID Ival Bible, MD   1 mg at 11/03/20 0800  . carbamazepine (TEGRETOL) chewable tablet 150 mg  150 mg Oral TID Ival Bible, MD   150 mg at 11/03/20 0755  . carvedilol (COREG) tablet 25 mg  25 mg Oral BID WC Sharma Covert, MD   25 mg at 11/03/20 0756  . haloperidol (HALDOL) tablet 10 mg  10 mg Oral Q6H PRN Sharma Covert, MD   10 mg at 11/02/20 2051  . haloperidol (HALDOL) tablet 10 mg  10 mg Oral BID Ival Bible, MD   10 mg at 11/03/20 0756  . hydrOXYzine (ATARAX/VISTARIL) tablet 25 mg  25 mg Oral TID PRN Sharma Covert, MD   25 mg at 11/02/20 0724  . magnesium hydroxide (MILK OF MAGNESIA) suspension 30 mL  30 mL Oral Daily PRN Sharma Covert, MD      . OLANZapine [facility]) injection 10 mg  10 mg Intramuscular Q6H PRN Sharma Covert, MD   10 mg at 11/02/20 0155  . OLANZapine zydis (ZYPREXA) disintegrating tablet 15 mg  15 mg Oral Daily Sharma Covert, MD   15 mg at 11/03/20 0755  . OLANZapine  zydis (ZYPREXA) disintegrating tablet 25 mg  25 mg Oral QHS Sharma Covert, MD   25 mg at 11/02/20 2051    Lab Results:  Results for orders placed or performed during the hospital encounter of 10/26/20 (from the past 48 hour(s))  CBC with Differential/Platelet  Status: None   Collection Time: 11/01/20  5:57 PM  Result Value Ref Range   WBC 6.1 4.0 - 10.5 K/uL   RBC 4.56 3.87 - 5.11 MIL/uL   Hemoglobin 13.2 12.0 - 15.0 g/dL   HCT 39.6 36 - 46 %   MCV 86.8 80.0 - 100.0 fL   MCH 28.9 26.0 - 34.0 pg   MCHC 33.3 30.0 - 36.0 g/dL   RDW 12.5 11.5 - 15.5 %   Platelets 233 150 - 400 K/uL   nRBC 0.0 0.0 - 0.2 %   Neutrophils Relative % 44 %   Neutro Abs 2.7 1.7 - 7.7 K/uL   Lymphocytes Relative 39 %   Lymphs Abs 2.4 0.7 - 4.0 K/uL   Monocytes Relative 8 %   Monocytes Absolute 0.5 0.1 - 1.0 K/uL   Eosinophils Relative 8 %   Eosinophils Absolute 0.5 0.0 - 0.5 K/uL   Basophils Relative 1 %   Basophils Absolute 0.1 0.0 - 0.1 K/uL   Immature Granulocytes 0 %   Abs Immature Granulocytes 0.01 0.00 - 0.07 K/uL    Comment: Performed at [facility], [place] [address] [place], [place] [address]  Comprehensive metabolic panel     Status: Abnormal   Collection Time: 11/01/20  5:57 PM  Result Value Ref Range   Sodium 140 135 - 145 mmol/L   Potassium 4.0 3.5 - 5.1 mmol/L   Chloride 104 98 - 111 mmol/L   CO2 27 22 - 32 mmol/L   Glucose, Bld 124 (H) 70 - 99 mg/dL    Comment: Glucose reference range applies only to samples taken after fasting for at least 8 hours.   BUN 27 (H) 6 - 20 mg/dL   Creatinine, Ser 1.06 (H) 0.44 - 1.00 mg/dL   Calcium 9.7 8.9 - 10.3 mg/dL   Total Protein 7.2 6.5 - 8.1 g/dL   Albumin 4.5 3.5 - 5.0 g/dL   AST 15 15 - 41 U/L   ALT 17 0 - 44 U/L   Alkaline Phosphatase 82 38 - 126 U/L   Total Bilirubin 0.3 0.3 - 1.2 mg/dL   GFR, Estimated >60 >60 mL/min    Comment: (NOTE) Calculated using the CKD-EPI Creatinine Equation (2021)    Anion gap 9 5 -  15    Comment: Performed at [facility], [place] [address] [place], [place] [address]  Carbamazepine level, total     Status: Abnormal   Collection Time: 11/01/20  5:57 PM  Result Value Ref Range   Carbamazepine Lvl 12.9 (H) 4.0 - 12.0 ug/mL    Comment: Performed at [facility] [address] [place], [place] [address]    Blood Alcohol level:  Lab Results  Component Value Date   [facility] <10 10/25/2020   ETH <10 82/95/6213    Metabolic Disorder Labs: Lab Results  Component Value Date   HGBA1C 6.0 (H) 10/26/2020   MPG 125.5 10/26/2020   MPG 111.15 02/08/2020   Lab Results  Component Value Date   PROLACTIN 11.0 03/14/2019   Lab Results  Component Value Date   CHOL 173 10/26/2020   TRIG 158 (H) 10/26/2020   HDL 46 10/26/2020   CHOLHDL 3.8 10/26/2020   VLDL 32 10/26/2020   LDLCALC 95 10/26/2020   LDLCALC 76 02/08/2020    Physical Findings: AIMS: Facial and Oral Movements Muscles of Facial Expression: None, normal Lips and Perioral Area: None, normal Jaw: None, normal Tongue: None, normal,Extremity Movements Upper (  arms, wrists, hands, fingers): None, normal Lower (legs, knees, ankles, toes): None, normal, Trunk Movements Neck, shoulders, hips: None, normal, Overall Severity Severity of abnormal movements (highest score from questions above): None, normal Incapacitation due to abnormal movements: None, normal Patient's awareness of abnormal movements (rate only patient's report): No Awareness, Dental Status Current problems with teeth and/or dentures?: No Does patient usually wear dentures?: No  CIWA:    COWS:     Musculoskeletal: Strength & Muscle Tone: within normal limits Gait & Station: shuffle Patient leans: N/A  Psychiatric Specialty Exam: Physical Exam Vitals and nursing note reviewed. Exam conducted with a chaperone present.  HENT:     Head: Normocephalic and atraumatic.  Pulmonary:     Effort: Pulmonary effort is normal.   Neurological:     General: No focal deficit present.     Mental Status: She is alert.     Review of Systems   Blood pressure (!) 140/97, pulse 99, temperature 98.4 F (36.9 C), temperature source Oral, resp. rate 20, height 5\' 1"  (1.549 m), weight 67.1 kg, SpO2 100 %.Body mass index is 27.96 kg/m.  General Appearance: Disheveled  Eye Contact:  Minimal  Speech:  Pressured  Volume:  Decreased  Mood:  Anxious and Dysphoric  Affect:  Labile  Thought Process:  Disorganized and Descriptions of Associations: Loose  Orientation:  Negative  Thought Content:  Delusions, Paranoid Ideation, Rumination and Tangential  Suicidal Thoughts:  No  Homicidal Thoughts:  No  Memory:  Immediate;   Poor Recent;   Poor Remote;   Poor  Judgement:  Impaired  Insight:  Lacking  Psychomotor Activity:  Increased  Concentration:  Concentration: Poor and Attention Span: Poor  Recall:  Poor  Fund of Knowledge:  Fair  Language:  Fair  Akathisia:  Yes  Handed:  Right  AIMS (if indicated):     Assets:  Desire for Improvement Resilience  ADL's:  Intact  Cognition:  WNL  Sleep:  Number of Hours: 2.75     Treatment Plan Summary: Daily contact with patient to assess and evaluate symptoms and progress in treatment, Medication management and Plan : Patient is seen and examined.  Patient is a 54 year old female with the above-stated past psychiatric history who is seen in follow-up.   Diagnosis: 1. Schizoaffective disorder; bipolar type 2. Hypertension 3. Hyperthyroidism 4. Diabetes mellitus type 2 5. Multiple thyroid nodules 6. Multinodular goiter  Pertinent findings on examination today: 1.  Patient remains disorganized, psychotic, tangential. 2.  Sleep is only mildly improved. 3. Carbamazepine level 12.9- mildly elevated, dose was decreased from 200 TID to 150 TID    Plan: 1.  Continue amlodipine 10 mg p.o. daily for hypertension. 2.  ContinueTegretol 150 mg p.o. 3 times daily for mood  stability as level slightly elevated. 3.  Continue carvedilol 25 mg p.o. twice daily with food for hypertension and rate control. 4.  Increase dose of  Haldol to 15 mg BID p.o. nightly only.  This is for psychosis. 5.  Continue hydroxyzine 25 mg p.o. 3 times daily as needed anxiety. 6.  continue olanzapine to 15 mg p.o. daily and 25 mg p.o. nightly for psychosis and mood stabilityse. 7.  Patient will require biopsy of thyroid nodules after stabilization of mood and improvement in psychosis. 8.  continue Cogentin 1  mg p.o. twice daily for tremor and possible EPS 9. Attempting to obtain collateral to determine patient baseline 10.  Disposition planning-in progress.   Ival Bible, MD 11/03/2020, 9:57 AM

## 2020-11-03 NOTE — BHH Group Notes (Signed)
[place] LCSW Group Therapy  11/03/2020 2:22 PM  Type of Therapy/Topic: Group Therapy: Emotion Regulation  Participation Level: Did Not Attend  Description of Group: The purpose of this group is to assist patients in learning to regulate negative emotions and experience positive emotions. Patients will be guided to discuss ways in which they have been vulnerable to their negative emotions. These vulnerabilities will be juxtaposed with experiences of positive emotions or situations, and patients will be challenged to use positive emotions to combat negative ones. Special emphasis will be placed on coping with negative emotions in conflict situations, and patients will process healthy conflict resolution skills.  Therapeutic Goals: 1. Patient will identify two positive emotions or experiences to reflect on in order to balance out negative emotions 2. Patient will label two or more emotions that they find the most difficult to experience 3. Patient will demonstrate positive conflict resolution skills through discussion and/or role plays  Summary of Patient Progress: Patient declined to attend.   Therapeutic Modalities: Cognitive Behavioral Therapy Feelings Identification Dialectical Behavioral Therapy     Illene Labrador Gracielynn Birkel 11/03/2020, 2:22 PM

## 2020-11-03 NOTE — Progress Notes (Signed)
Nursing 1:1 note D:Pt observed sleeping in bed with eyes closed. RR even and unlabored. No distress noted. A: 1:1 observation continues for safety  R: pt remains safe  

## 2020-11-03 NOTE — Progress Notes (Signed)
Pt is taking medications as prescribed.  Pt is disorganized and paranoid.  She is religiously preoccupied and tends to wander on the unit.  Pt remains on 1:1 supervision with MHT.  Pt is responding well to redirection from MHT.  RN administered pt's medications and assessed for needs and concerns.  Pt appears to be in no acute distress at this time. RN will continue to monitor and provide support as needed.

## 2020-11-03 NOTE — Progress Notes (Signed)
Pt is calm and cooperative at this time.  Pt responds to redirection appropriately.  RN assessed for needs and concerns.  Pt remains safe with 1:1 supervision in place.

## 2020-11-03 NOTE — Progress Notes (Signed)
Pt took medications without incident.  Pt is smiling and appears to be in good spirits this afternoon.  No immediate concerns are identified at this time. RN will continue to monitor and provide assistance as needed.  Pt remains safe with 1:1 supervision in place.
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
## 2020-11-03 NOTE — Progress Notes (Signed)
Pt agitated and pacing the halls. Does not want to stay in her room. Speech rapid and garbled. Pt given Zyprexa 10 mg IM left deltoid. Will continue to monitor.

## 2020-11-03 NOTE — Progress Notes (Signed)
Recreation Therapy Notes  Date: 12.8.21 Time: 0950 Location: 500 Hall Dayroom  Group Topic: Coping Skills  Goal Area(s) Addresses:  Patient will identify positive coping skills. Patient will identify benefit of using coping skills.  Behavioral Response: Appropriae  Intervention: Worksheet; Pencils  Activity: Mind Map.  LRT and patients came up with 8 (anger, anxiety, communication, family/people, stressed, emotions, needs/wants and self care) instances in which coping skills would be used.  Patients were to then come up with at least 3 coping skills to deal with each area identified.  Education: Radiographer, therapeutic, Dentist.   Education Outcome: Acknowledges understanding/In group clarification offered/Needs additional education.   Clinical Observations/Feedback: Pt was attentive and attempted to engage during group.  Pt was hard to understand because pt speech was mumbled and seemed to run together.  Pt was able to identify two coping skills as listen to music and support groups.  Pt was pleasant throughout group session and easy to redirect when needed.    [name], LRT/CTRS    [name] A 11/03/2020 11:36 AM

## 2020-11-04 MED ORDER — LORAZEPAM 1 MG PO TABS
2.0000 mg | ORAL_TABLET | Freq: Once | ORAL | Status: AC
  Administered 2020-11-04: 2 mg via ORAL
  Filled 2020-11-04: qty 2

## 2020-11-04 MED ORDER — HALOPERIDOL 5 MG PO TABS
20.0000 mg | ORAL_TABLET | Freq: Two times a day (BID) | ORAL | Status: DC
  Administered 2020-11-04 – 2020-11-08 (×8): 20 mg via ORAL
  Filled 2020-11-04 (×11): qty 4

## 2020-11-04 NOTE — Progress Notes (Signed)
CI sat in day room with pt  Pt was pleasant and talked about her favorite TV shows  Pt stated that she was feeling better and getting better everyday   CI can follow up if needed   [place] Intern @ [facility]

## 2020-11-04 NOTE — Progress Notes (Signed)
   11/03/20 2000  Psych Admission Type (Psych Patients Only)  Admission Status Voluntary  Psychosocial Assessment  Patient Complaints None  Eye Contact Intense;Watchful  Facial Expression Anxious;Pensive  Affect Anxious  Speech Rapid;Pressured;Tangential;Rhyming;Slurred  Interaction Assertive;Childlike;Intrusive  Motor Activity Fidgety;Restless;Unsteady  Appearance/Hygiene In scrubs;Layered clothes;Disheveled  Behavior Characteristics Cooperative;Anxious  Mood Anxious;Pleasant  Thought Process  Coherency Disorganized;Flight of ideas  Content Obsessions;Paranoia  Delusions Paranoid;Religious  Perception Derealization  Hallucination None reported or observed  Judgment Limited  Confusion Mild  Danger to Self  Current suicidal ideation? Denies  Danger to Others  Danger to Others None reported or observed

## 2020-11-04 NOTE — Progress Notes (Addendum)
Nursing 1:1 note D:Pt observed sleeping in bed eating a snack. RR even and unlabored. No distress noted.pt continues to be intrusive , needing constant redirection. Pt labile, but has been mostly pleasant this evening A: 1:1 observation continues for safety  R: pt remains safe

## 2020-11-04 NOTE — Progress Notes (Signed)
Pt presents with nonsensical speech often and she continues to pace on unit and attempt to walk in pt's rooms.  Pt has 1:1 support from MHT in place.  Pt taking medications without incident. Pt remains safe with q 15 min checks in place.

## 2020-11-04 NOTE — Progress Notes (Signed)
Pt denies SI/HI/AVH.  Pt's speech is disorganized and she continues to be religiously preoccupied.  RN assessed for needs and concerns and administered medications per provider orders. Pt tolerating medications without incident. Pt continues with 1:1 support from MHT.  RN will continue to monitor and provide support as needed.

## 2020-11-04 NOTE — Progress Notes (Signed)
Nursing 1:1 note D:Pt observed sitting in day room talking to sitter A: 1:1 observation continues for safety  R: pt remains safe

## 2020-11-04 NOTE — Progress Notes (Signed)
   11/04/20 2200  Psych Admission Type (Psych Patients Only)  Admission Status Voluntary  Psychosocial Assessment  Patient Complaints Anxiety;Irritability;Worrying;Suspiciousness  Eye Contact Intense;Watchful  Facial Expression Anxious;Pensive  Affect Anxious  Speech Pressured;Tangential;Rhyming;Slurred  Interaction Assertive;Childlike;Intrusive  Motor Activity Fidgety;Restless;Unsteady  Appearance/Hygiene In scrubs;Layered clothes;Disheveled  Behavior Characteristics Irritable;Intrusive;Anxious  Mood Labile;Anxious;Pleasant  Thought Process  Coherency Disorganized;Flight of ideas  Content Obsessions;Paranoia  Delusions Paranoid;Religious  Perception Derealization  Hallucination None reported or observed  Judgment Limited  Confusion Mild  Danger to Self  Current suicidal ideation? Denies  Danger to Others  Danger to Others None reported or observed

## 2020-11-04 NOTE — Progress Notes (Signed)
Pt labile needing constant redirection when awake. Pt gets agitated at times and is argumentative with staff.

## 2020-11-04 NOTE — Progress Notes (Signed)
Nursing 1:1 note D:Pt observed walking down the hall, rambling speech, being loud and hard to redirect. Pt sent back to her room with sitter  A: 1:1 observation continues for safety  R: pt remains safe

## 2020-11-04 NOTE — Progress Notes (Signed)
[facility] MD Progress Note  11/04/2020 11:35 AM Paula Dickson  MRN:  [id number] Subjective:  Patient is a 54 year old female with a reported past psychiatric history significant for schizoaffective disorder; bipolar type who originally presented to the [facility] emergency department on 10/25/2020 after her family members had brought her to the hospital because of worsening manic symptoms.  Objective: Chart reviewed- medication complaint, she received 10 mg of PRN haldol, 10 mg PRN zyprexa over the last 24 hours.  Patient interviewed this AM in her room; she remains disorganized, tangential, hyperreligious. Upon introducing myself, she states "are you going to represent me?". Explained to patient that I am the psychiatrist to which she immediately responds "you are not my doctor". Pt recalls seeing another physician, informed that Dr.Clary was previously seeing her and  she states that "Mr. Clary told me I could go". Explained to patient that she has no discharge plans today. She denies AVH/HI. Contracts for safety. She discusses her purpose in life to praise Jesus and to make sure people go to [place]. She becomes increasingly disorganized as interview progresses. She becomes irritable when asked orientation questions and declines to answer.   Pt receiving less PRNs with increased haldol dose; will further increase haldol dose.   Again she has the history of hyperthyroidism.  She has a multinodular goiter.  She has at least 2 nodules which appear to require biopsy.  Principal Problem: <principal problem not specified> Diagnosis: Active Problems:   Schizophrenia ([place])  Total Time spent with patient: 20 minutes  Past Psychiatric History: See admission H&P  Past Medical History:  Past Medical History:  Diagnosis Date  . Bipolar affective disorder ([place])   . History of arthritis   . History of chicken pox   . History of depression   . History of genital warts   . history  of heart murmur   . History of high blood pressure   . History of thyroid disease   . History of UTI   . Hypertension   . Low TSH level 07/13/2017  . Schizophrenia [facility])     Past Surgical History:  Procedure Laterality Date  . ABLATION ON ENDOMETRIOSIS    . CYST REMOVAL NECK     around 11 years ago /benign  . MULTIPLE TOOTH EXTRACTIONS     Family History:  Family History  Problem Relation Age of Onset  . Arthritis Father   . Hyperlipidemia Father   . High blood pressure Father   . Diabetes Sister   . Diabetes Mother   . Diabetes Brother   . Mental illness Brother   . Alcohol abuse Paternal Uncle   . Alcohol abuse Paternal Grandfather   . Breast cancer Maternal Aunt   . Breast cancer Paternal Aunt   . High blood pressure Sister   . Mental illness Other        runs in family   Family Psychiatric  History: See admission H&P Social History:  Social History   Substance and Sexual Activity  Alcohol Use No     Social History   Substance and Sexual Activity  Drug Use No    Social History   Socioeconomic History  . Marital status: Single    Spouse name: Not on file  . Number of children: Not on file  . Years of education: Not on file  . Highest education level: Patient refused  Occupational History  . Not on file  Tobacco Use  . Smoking status: Never  Smoker  . Smokeless tobacco: Never Used  Vaping Use  . Vaping Use: Never used  Substance and Sexual Activity  . Alcohol use: No  . Drug use: No  . Sexual activity: Not Currently  Other Topics Concern  . Not on file  Social History Narrative  . Not on file   Social Determinants of Health   Financial Resource Strain: Not on file  Food Insecurity: Not on file  Transportation Needs: Not on file  Physical Activity: Not on file  Stress: Not on file  Social Connections: Not on file   Additional Social History:                         Sleep: Fair  Appetite:  Fair  Current Medications: Current  Facility-Administered Medications  Medication Dose Route Frequency Provider Last Rate Last Admin  . acetaminophen (TYLENOL) tablet 650 mg  650 mg Oral Q6H PRN Sharma Covert, MD   650 mg at 10/31/20 2157  . alum & mag hydroxide-simeth (MAALOX/MYLANTA) 200-200-20 MG/5ML suspension 30 mL  30 mL Oral Q4H PRN Sharma Covert, MD      . amLODipine (NORVASC) tablet 10 mg  10 mg Oral Daily Sharma Covert, MD   10 mg at 11/04/20 0817  . benztropine (COGENTIN) tablet 1 mg  1 mg Oral BID Ival Bible, MD   1 mg at 11/04/20 0818  . carbamazepine (TEGRETOL) chewable tablet 150 mg  150 mg Oral TID Ival Bible, MD   150 mg at 11/04/20 0818  . carvedilol (COREG) tablet 25 mg  25 mg Oral BID WC Sharma Covert, MD   25 mg at 11/04/20 0817  . haloperidol (HALDOL) tablet 10 mg  10 mg Oral Q6H PRN Sharma Covert, MD   10 mg at 11/03/20 2034  . haloperidol (HALDOL) tablet 15 mg  15 mg Oral BID Ival Bible, MD   15 mg at 11/04/20 8299  . hydrOXYzine (ATARAX/VISTARIL) tablet 25 mg  25 mg Oral TID PRN Sharma Covert, MD   25 mg at 11/04/20 0820  . magnesium hydroxide (MILK OF MAGNESIA) suspension 30 mL  30 mL Oral Daily PRN Sharma Covert, MD      . OLANZapine [facility]) injection 10 mg  10 mg Intramuscular Q6H PRN Sharma Covert, MD   10 mg at 11/03/20 2242  . OLANZapine zydis (ZYPREXA) disintegrating tablet 15 mg  15 mg Oral Daily Sharma Covert, MD   15 mg at 11/04/20 0820  . OLANZapine zydis (ZYPREXA) disintegrating tablet 25 mg  25 mg Oral QHS Sharma Covert, MD   25 mg at 11/03/20 2033    Lab Results:  No results found for this or any previous visit (from the past 48 hour(s)).  Blood Alcohol level:  Lab Results  Component Value Date   ETH <10 10/25/2020   ETH <10 37/16/9678    Metabolic Disorder Labs: Lab Results  Component Value Date   HGBA1C 6.0 (H) 10/26/2020   MPG 125.5 10/26/2020   MPG 111.15 02/08/2020   Lab Results  Component  Value Date   PROLACTIN 11.0 03/14/2019   Lab Results  Component Value Date   CHOL 173 10/26/2020   TRIG 158 (H) 10/26/2020   HDL 46 10/26/2020   CHOLHDL 3.8 10/26/2020   VLDL 32 10/26/2020   [place] 95 10/26/2020   LDLCALC 76 02/08/2020    Physical Findings: AIMS: Facial and Oral Movements  Muscles of Facial Expression: None, normal Lips and Perioral Area: None, normal Jaw: None, normal Tongue: None, normal,Extremity Movements Upper (arms, wrists, hands, fingers): None, normal Lower (legs, knees, ankles, toes): None, normal, Trunk Movements Neck, shoulders, hips: None, normal, Overall Severity Severity of abnormal movements (highest score from questions above): None, normal Incapacitation due to abnormal movements: None, normal Patient's awareness of abnormal movements (rate only patient's report): No Awareness, Dental Status Current problems with teeth and/or dentures?: No Does patient usually wear dentures?: No  CIWA:    COWS:     Musculoskeletal: Strength & Muscle Tone: within normal limits Gait & Station: shuffle Patient leans: N/A  Psychiatric Specialty Exam: Physical Exam Vitals and nursing note reviewed. Exam conducted with a chaperone present.  HENT:     Head: Normocephalic and atraumatic.  Pulmonary:     Effort: Pulmonary effort is normal.  Neurological:     General: No focal deficit present.     Mental Status: She is alert.     Review of Systems   Blood pressure (!) 141/90, pulse 82, temperature 98.3 F (36.8 C), temperature source Oral, resp. rate 20, height 5\' 1"  (1.549 m), weight 67.1 kg, SpO2 100 %.Body mass index is 27.96 kg/m.  General Appearance: Disheveled  Eye Contact:  Minimal  Speech:  Pressured  Volume:  Decreased  Mood:  Anxious and Dysphoric  Affect:  Labile  Thought Process:  Disorganized and Descriptions of Associations: Loose  Orientation:  Negative  Thought Content:  Delusions, Paranoid Ideation, Rumination and Tangential   Suicidal Thoughts:  No  Homicidal Thoughts:  No  Memory:  Immediate;   Poor Recent;   Poor Remote;   Poor  Judgement:  Impaired  Insight:  Lacking  Psychomotor Activity:  Increased  Concentration:  Concentration: Poor and Attention Span: Poor  Recall:  Poor  Fund of Knowledge:  Fair  Language:  Fair  Akathisia:  Yes  Handed:  Right  AIMS (if indicated):     Assets:  Desire for Improvement Resilience  ADL's:  Intact  Cognition:  WNL  Sleep:  Number of Hours: 5.5     Treatment Plan Summary: Daily contact with patient to assess and evaluate symptoms and progress in treatment, Medication management and Plan : Patient is seen and examined.  Patient is a 54 year old female with the above-stated past psychiatric history who is seen in follow-up.   Diagnosis: 1. Schizoaffective disorder; bipolar type 2. Hypertension 3. Hyperthyroidism 4. Diabetes mellitus type 2 5. Multiple thyroid nodules 6. Multinodular goiter  Pertinent findings on examination today: 1.  Patient remains disorganized, psychotic, tangential. 2.  Sleep is only mildly improved. 3. Carbamazepine level 12.9- mildly elevated, dose was decreased from 200 TID to 150 TID  4. Carbamazepine level, CBC, CMP ordered for AM of 12/9 to  Monitor level with decreased dose   Plan: 1.  Continue amlodipine 10 mg p.o. daily for hypertension. 2.  ContinueTegretol 150 mg p.o. 3 times daily for mood stability as level slightly elevated. 3.  Continue carvedilol 25 mg p.o. twice daily with food for hypertension and rate control. 4.  Increase dose of  Haldol to 20 mg BID p.o. nightly only.  This is for psychosis. 5.  Continue hydroxyzine 25 mg p.o. 3 times daily as needed anxiety. 6.  continue olanzapine to 15 mg p.o. daily and 25 mg p.o. nightly for psychosis and mood stabilityse. 7.  Patient will require biopsy of thyroid nodules after stabilization of mood and improvement in psychosis. 8.  continue Cogentin 1  mg p.o.  twice daily for tremor and possible EPS 9. Attempting to obtain collateral to determine patient baseline 10.  Disposition planning-in progress.   Ival Bible, MD 11/04/2020, 11:35 AM

## 2020-11-04 NOTE — Progress Notes (Signed)
Recreation Therapy Notes  Date: 12.9.21 Time: 1000 Location: 500 Hall Dayroom  Group Topic: Self-Esteem  Goal Area(s) Addresses:  Patient will identify positive character traits about themselves.  Patient will identify positive activities they like. Patient will identify any positive things that sets them apart from others.  Behavioral Response: Engaged  Intervention: Visual merchandiser, safety scissors, glue sticks, magazines  Activity: Collage.  Patients were to create a collage, using the supplies provided, that would highlight any positive characteristics or positive activities that describes them using any pictures or words from the magazines.  Education:  Self-esteem, Discharge planning  Education Outcome: Acknowledges understanding/In group clarification offered/Needs additional education  Clinical Observations/Feedback: Pt was still hard to understand at times.  Pt still mumbled and her words ran together.  Pt did expresses she likes to cook cabbage, squash and bake.  Pt admitted she had never baked from scratch but believes she can do it.  Pt also stated she liked orange and her sister likes burnt orange.     [name], LRT/CTRS     [name] A 11/04/2020 12:36 PM

## 2020-11-04 NOTE — B[facility] Group Notes (Signed)
Occupational Therapy Group Note Date: 11/04/2020 Group Topic/Focus: Socialization/Social Skills  Group Description: Group encouraged increased social engagement and participation through discussion/activity focused on improving socialization and age appropriate social skills. Patients were encouraged to fill out a structured worksheet with the following three prompts: one thing I want you to know about me, the hardest thing that I am dealing with today is, and my goal for today is... Discussion followed with patients sharing their responses and then transitioned into an interactive "Name 5" activity to promote socialization and orientation.   Therapeutic Goal(s): Demonstrate age-appropriate social skills and socialization within a structured group setting Demonstrate orientation to topic and identify relevant responses to group discussion.   Participation Level: Pt made two attempts to engage and sit in group space, however was unable to do so, despite support from 1:1 MHT. When this writer engaged pt in discussion, pt appeared irritable and declined to engage.    Plan: Continue to engage patient in OT groups 2 - 3x/week.  11/04/2020  Ponciano Ort, MOT, OTR/L

## 2020-11-04 NOTE — Progress Notes (Signed)
Pt was sleeping until another pt pressed the STARR button and woke her up.

## 2020-11-05 LAB — COMPREHENSIVE METABOLIC PANEL
ALT: 20 U/L (ref 0–44)
AST: 14 U/L — ABNORMAL LOW (ref 15–41)
Albumin: 3.9 g/dL (ref 3.5–5.0)
Alkaline Phosphatase: 86 U/L (ref 38–126)
Anion gap: 10 (ref 5–15)
BUN: 22 mg/dL — ABNORMAL HIGH (ref 6–20)
CO2: 26 mmol/L (ref 22–32)
Calcium: 9.7 mg/dL (ref 8.9–10.3)
Chloride: 108 mmol/L (ref 98–111)
Creatinine, Ser: 1.07 mg/dL — ABNORMAL HIGH (ref 0.44–1.00)
GFR, Estimated: >60 mL/min (ref >60)
Glucose, Bld: 116 mg/dL — ABNORMAL HIGH (ref 70–99)
Potassium: 4.2 mmol/L (ref 3.5–5.1)
Sodium: 144 mmol/L (ref 135–145)
Total Bilirubin: 0.2 mg/dL — ABNORMAL LOW (ref 0.3–1.2)
Total Protein: 6.5 g/dL (ref 6.5–8.1)

## 2020-11-05 LAB — CBC WITH DIFFERENTIAL/PLATELET
Abs Immature Granulocytes: 0.01 10*3/uL (ref 0.00–0.07)
Basophils Absolute: 0.1 10*3/uL (ref 0.0–0.1)
Basophils Relative: 1 %
Eosinophils Absolute: 0.4 10*3/uL (ref 0.0–0.5)
Eosinophils Relative: 7 %
HCT: 39.6 % (ref 36.0–46.0)
Hemoglobin: 12.7 g/dL (ref 12.0–15.0)
Immature Granulocytes: 0 %
Lymphocytes Relative: 43 %
Lymphs Abs: 2.3 10*3/uL (ref 0.7–4.0)
MCH: 28 pg (ref 26.0–34.0)
MCHC: 32.1 g/dL (ref 30.0–36.0)
MCV: 87.4 fL (ref 80.0–100.0)
Monocytes Absolute: 0.4 10*3/uL (ref 0.1–1.0)
Monocytes Relative: 8 %
Neutro Abs: 2.2 10*3/uL (ref 1.7–7.7)
Neutrophils Relative %: 41 %
Platelets: 217 10*3/uL (ref 150–400)
RBC: 4.53 MIL/uL (ref 3.87–5.11)
RDW: 12.5 % (ref 11.5–15.5)
WBC: 5.3 10*3/uL (ref 4.0–10.5)
nRBC: 0 % (ref 0.0–0.2)

## 2020-11-05 LAB — CARBAMAZEPINE LEVEL, TOTAL: Carbamazepine Lvl: 7.5 ug/mL (ref 4.0–12.0)

## 2020-11-05 MED ORDER — LORAZEPAM 1 MG PO TABS
2.0000 mg | ORAL_TABLET | Freq: Once | ORAL | Status: AC
  Administered 2020-11-05: 2 mg via ORAL
  Filled 2020-11-05: qty 2

## 2020-11-05 NOTE — BHH Group Notes (Signed)
[place] LCSW Group Therapy  11/05/2020 1:45 PM  Type of Therapy:  Psychoeducational Skills  Participation Level:  Active  Participation Quality:  Sharing  Affect:  Excited  Cognitive:  Disorganized  Insight:  Distracting  Engagement in Therapy:  Distracting  Modes of Intervention:  Discussion and Rapport Building  Summary of Progress/Problems: Patient attended and participated in group. Patient was hard to redirect and was often distracting with her commentary and movement.   Paula Dickson [name] 11/05/2020, 1:45 PM

## 2020-11-05 NOTE — Tx Team (Signed)
Interdisciplinary Treatment and Diagnostic Plan Update  11/05/2020 Time of Session: 9:10am Paula Dickson MRN: [id number]  Principal Diagnosis: <principal problem not specified>  Secondary Diagnoses: Active Problems:   Schizophrenia ([place])   Current Medications:  Current Facility-Administered Medications  Medication Dose Route Frequency Provider Last Rate Last Admin  . acetaminophen (TYLENOL) tablet 650 mg  650 mg Oral Q6H PRN Sharma Covert, MD   650 mg at 10/31/20 2157  . alum & mag hydroxide-simeth (MAALOX/MYLANTA) 200-200-20 MG/5ML suspension 30 mL  30 mL Oral Q4H PRN Sharma Covert, MD      . amLODipine (NORVASC) tablet 10 mg  10 mg Oral Daily Sharma Covert, MD   10 mg at 11/05/20 [phone]  . benztropine (COGENTIN) tablet 1 mg  1 mg Oral BID Ival Bible, MD   1 mg at 11/05/20 0109  . carbamazepine (TEGRETOL) chewable tablet 150 mg  150 mg Oral TID Ival Bible, MD   150 mg at 11/05/20 0837  . carvedilol (COREG) tablet 25 mg  25 mg Oral BID WC Sharma Covert, MD   25 mg at 11/05/20 3235  . haloperidol (HALDOL) tablet 10 mg  10 mg Oral Q6H PRN Sharma Covert, MD   10 mg at 11/03/20 2034  . haloperidol (HALDOL) tablet 20 mg  20 mg Oral BID Ival Bible, MD   20 mg at 11/05/20 5732  . hydrOXYzine (ATARAX/VISTARIL) tablet 25 mg  25 mg Oral TID PRN Sharma Covert, MD   25 mg at 11/04/20 2045  . magnesium hydroxide (MILK OF MAGNESIA) suspension 30 mL  30 mL Oral Daily PRN Sharma Covert, MD      . OLANZapine zydis (ZYPREXA) disintegrating tablet 15 mg  15 mg Oral Daily Sharma Covert, MD   15 mg at 11/05/20 0837  . OLANZapine zydis (ZYPREXA) disintegrating tablet 25 mg  25 mg Oral QHS Sharma Covert, MD   25 mg at 11/04/20 2042   PTA Medications: Medications Prior to Admission  Medication Sig Dispense Refill Last Dose  . amLODipine (NORVASC) 10 MG tablet Take 10 mg by mouth daily.   Past Week at Unknown time  . benztropine  (COGENTIN) 1 MG tablet Take 1 tablet (1 mg total) by mouth 2 (two) times daily. For prevention of drug induced tremors 60 tablet 0   . carbamazepine (TEGRETOL) 100 MG chewable tablet Take 1 tablet (100 mg) by mouth in the morning & 2 tablets at bedtime: For mood stabilization 60 tablet 0   . carvedilol (COREG) 25 MG tablet Take 1 tablet (25 mg total) by mouth 2 (two) times daily with a meal. 60 tablet 2   . haloperidol (HALDOL) 10 MG tablet Take 1 tablet (10 mg total) by mouth at bedtime. For mood control 30 tablet 0   . haloperidol decanoate (HALDOL DECANOATE) 100 MG/ML injection Inject 1.5 mLs (150 mg total) into the muscle every 30 (thirty) days. (Due on 02/27/20): For mood control (Patient not taking: Reported on 10/26/2020) 1 mL 0 Not Taking  . traZODone (DESYREL) 50 MG tablet Take 1 tablet by mouth at bedtime as needed.       Patient Stressors: Financial difficulties Health problems Medication change or noncompliance  Patient Strengths: Active sense of humor  Treatment Modalities: Medication Management, Group therapy, Case management,  1 to 1 session with clinician, Psychoeducation, Recreational therapy.   Physician Treatment Plan for Primary Diagnosis: <principal problem not specified> Long Term Goal(s): Improvement in symptoms  so as ready for discharge Improvement in symptoms so as ready for discharge   Short Term Goals: Ability to identify changes in lifestyle to reduce recurrence of condition will improve Ability to verbalize feelings will improve Ability to demonstrate self-control will improve Ability to identify and develop effective coping behaviors will improve Ability to maintain clinical measurements within normal limits will improve Compliance with prescribed medications will improve Ability to identify changes in lifestyle to reduce recurrence of condition will improve Ability to verbalize feelings will improve Ability to demonstrate self-control will improve Ability  to identify and develop effective coping behaviors will improve Ability to maintain clinical measurements within normal limits will improve Compliance with prescribed medications will improve  Medication Management: Evaluate patient's response, side effects, and tolerance of medication regimen.  Therapeutic Interventions: 1 to 1 sessions, Unit Group sessions and Medication administration.  Evaluation of Outcomes: Progressing  Physician Treatment Plan for Secondary Diagnosis: Active Problems:   Schizophrenia ([place])  Long Term Goal(s): Improvement in symptoms so as ready for discharge Improvement in symptoms so as ready for discharge   Short Term Goals: Ability to identify changes in lifestyle to reduce recurrence of condition will improve Ability to verbalize feelings will improve Ability to demonstrate self-control will improve Ability to identify and develop effective coping behaviors will improve Ability to maintain clinical measurements within normal limits will improve Compliance with prescribed medications will improve Ability to identify changes in lifestyle to reduce recurrence of condition will improve Ability to verbalize feelings will improve Ability to demonstrate self-control will improve Ability to identify and develop effective coping behaviors will improve Ability to maintain clinical measurements within normal limits will improve Compliance with prescribed medications will improve     Medication Management: Evaluate patient's response, side effects, and tolerance of medication regimen.  Therapeutic Interventions: 1 to 1 sessions, Unit Group sessions and Medication administration.  Evaluation of Outcomes: Progressing   RN Treatment Plan for Primary Diagnosis: <principal problem not specified> Long Term Goal(s): Knowledge of disease and therapeutic regimen to maintain health will improve  Short Term Goals: Ability to remain free from injury will improve, Ability to  verbalize frustration and anger appropriately will improve, Ability to demonstrate self-control, Ability to participate in decision making will improve, Ability to verbalize feelings will improve, Ability to identify and develop effective coping behaviors will improve and Compliance with prescribed medications will improve  Medication Management: RN will administer medications as ordered by provider, will assess and evaluate patient's response and provide education to patient for prescribed medication. RN will report any adverse and/or side effects to prescribing provider.  Therapeutic Interventions: 1 on 1 counseling sessions, Psychoeducation, Medication administration, Evaluate responses to treatment, Monitor vital signs and CBGs as ordered, Perform/monitor CIWA, COWS, AIMS and Fall Risk screenings as ordered, Perform wound care treatments as ordered.  Evaluation of Outcomes: Progressing   LCSW Treatment Plan for Primary Diagnosis: <principal problem not specified> Long Term Goal(s): Safe transition to appropriate next level of care at discharge, Engage patient in therapeutic group addressing interpersonal concerns.  Short Term Goals: Engage patient in aftercare planning with referrals and resources, Increase social support, Increase ability to appropriately verbalize feelings, Increase emotional regulation, Identify triggers associated with mental health/substance abuse issues and Increase skills for wellness and recovery  Therapeutic Interventions: Assess for all discharge needs, 1 to 1 time with Social worker, Explore available resources and support systems, Assess for adequacy in community support network, Educate family and significant other(s) on suicide prevention, Complete Psychosocial Assessment,  Interpersonal group therapy.  Evaluation of Outcomes: Progressing   Progress in Treatment: Attending groups: Yes. and No. Participating in groups: Yes. and No. Taking medication as  prescribed: Yes. Toleration medication: Yes. Family/Significant other contact made: Yes, individual(s) contacted:  sister Patient understands diagnosis: Yes. Discussing patient identified problems/goals with staff: Yes. Medical problems stabilized or resolved: Yes. Denies suicidal/homicidal ideation: Yes. Issues/concerns per patient self-inventory: No.   New problem(s) identified: No, Describe:  none  New Short Term/Long Term Goal(s): medication stabilization, elimination of SI thoughts, development of comprehensive mental wellness plan.   Patient Goals:  "To move forward"  Discharge Plan or Barriers: Patient is followed by [facility] for medication management.    Reason for Continuation of Hospitalization: Delusions  Medication stabilization  Estimated Length of Stay: 3-5 days  Attendees: Patient:  10/27/2020   Physician:  10/27/2020   Nursing:  10/27/2020   RN Care Manager: 10/27/2020  Social Worker: Darletta Moll, LCSW 10/27/2020   Recreational Therapist:  10/27/2020   Other:  10/27/2020  Other:  10/27/2020   Other: 10/27/2020       Scribe for Treatment Team: Vassie Moselle, LCSW 11/05/2020 10:45 AM

## 2020-11-05 NOTE — Progress Notes (Signed)
Recreation Therapy Notes  Date: 12.10.21 Time: 1000 Location: 500 Hall Dayroom  Group Topic: Wellness  Goal Area(s) Addresses:  Patient will define components of whole wellness. Patient will verbalize benefit of whole wellness.  Behavioral Response: Active  Intervention: Music  Activity: Exercise.  LRT led patients in a series of stretches to loosen up the body.  Patients were then given the opportunity to lead the group in exercises of their choosing.  The group was to get at least 30 minutes of exercise.  Patients could take breaks and get water as needed.  Education: Wellness, Dentist.   Education Outcome: Acknowledges education/In group clarification offered/Needs additional education.   Clinical Observations/Feedback: Pt was bright and smiling throughout group.  Pt came in late and joined right in with the activity.  Pt led group in 2 or 3 different dance moves before leaving and not returning.   [name], LRT/CTRS         [name] A 11/05/2020 11:31 AM

## 2020-11-05 NOTE — Progress Notes (Signed)
Nursing Close obs note D:Pt observed sitting in bed, pt restless at this time.   RR even and unlabored. No distress noted. Pt encouraged to relax A: 1:1 observation continues for safety  R: pt remains safe

## 2020-11-05 NOTE — Progress Notes (Signed)
Close observation: Patient placed on close observation for safety.  Patient observed going into other patient's room.  Unable for follow redirection.  Patient is impulsive and intrusive.  Acting bizarre, disorganized with tangential speech and thought process.  Engaged in verbal altercation with another patient.  Patient is safe on the unit with supervision.

## 2020-11-05 NOTE — Progress Notes (Signed)
[place] [facility] 1:1 Observation Documentation  For the first (8) hours following discontinuation of 1:1 precautions, a progress note entry by nursing staff should be documented at least every 2 hours, reflecting the patient's behavior, condition, mood, and conversation.  Use the progress notes for additional entries.  Time 1:1 discontinued:  12pm  Patient's Behavior:  Patient is visible in milieu.  Patient's Condition:  Patient is calm and appropriate  Patient's Conversation: Interacting with staff and peers in the hallway.   Paula Dickson 11/05/2020, 2:58 PM

## 2020-11-05 NOTE — Progress Notes (Signed)
Pt up a little restless and agitated, pt given PRN Haldol per MAR at pt request and encouraged to relax.

## 2020-11-05 NOTE — Progress Notes (Signed)
Nursing 1:1 note D:Pt observed sleeping in bed with eyes closed. RR even and unlabored. No distress noted. A: 1:1 observation continues for safety  R: pt remains safe  

## 2020-11-05 NOTE — Progress Notes (Signed)
Nursing 1:1 note D:Pt observed sitting in dayroom RR even and unlabored. No distress noted. A: 1:1 observation continues for safety  R: pt remains safe

## 2020-11-05 NOTE — Progress Notes (Signed)
[place] [facility] 1:1 Observation Documentation  For the first (8) hours following discontinuation of 1:1 precautions, a progress note entry by nursing staff should be documented at least every 2 hours, reflecting the patient's behavior, condition, mood, and conversation.  Use the progress notes for additional entries.  Time 1:1 discontinued:  12pm  Patient's Behavior:  Patient is calm and appropriate  Patient's Condition:  Patient is alert and oriented.  Patient's Conversation:  Patient in the dayroom interacting with staff and peers.  Coralyn Mark Rayden Dock 11/05/2020, 2:56 PM

## 2020-11-05 NOTE — Progress Notes (Addendum)
[facility] MD Progress Note  11/05/2020 9:52 AM Paula Dickson  MRN:  [id number] Subjective:  Patient is a 54 year old female with a reported past psychiatric history significant for schizoaffective disorder; bipolar type who originally presented to the [facility] emergency department on 10/25/2020 after her family members had brought her to the hospital because of worsening manic symptoms.  Today:  Chart reviewed and patient discussed with treatment team. Patient is medication complaint, she has received no PRN medications in the last 24 hours.  Patient interviewed this AM in her room; she continues to be disorganized, tangential, and hyperreligious. She is no longer on 1:1. She is focused on discharge. She was taking her morning medications. She has  improved since adding Haldol to her medication regimen. She is  able to have a short, semi-coherent conversation. She stated "I need to leave because I have things to do. Where are my car keys? Oh that is right, they are downstairs."  When asked how she is today, she replied, "I am delightful."  Explained to patient that she has no discharge plans today. She denies AVH/HI. Contracts for safety. She becomes increasingly disorganized as interview progresses.  Pt receiving less PRNs with increased haldol dose; will further increase haldol dose. Carbamazepine level 7.5 today, CBC and CMP reviewed with no concerns. BUN 22 and Creatinine 1.07, will continue to monitor.   Again she has the history of hyperthyroidism.  She has a multinodular goiter.  She has at least 2 nodules which appear to require biopsy.  Principal Problem: <principal problem not specified> Diagnosis: Active Problems:   Schizophrenia ([place])  Total Time spent with patient: 20 minutes  Past Psychiatric History: See admission H&P  Past Medical History:  Past Medical History:  Diagnosis Date  . Bipolar affective disorder ([place])   . History of arthritis   . History  of chicken pox   . History of depression   . History of genital warts   . history of heart murmur   . History of high blood pressure   . History of thyroid disease   . History of UTI   . Hypertension   . Low TSH level 07/13/2017  . Schizophrenia [facility])     Past Surgical History:  Procedure Laterality Date  . ABLATION ON ENDOMETRIOSIS    . CYST REMOVAL NECK     around 11 years ago /benign  . MULTIPLE TOOTH EXTRACTIONS     Family History:  Family History  Problem Relation Age of Onset  . Arthritis Father   . Hyperlipidemia Father   . High blood pressure Father   . Diabetes Sister   . Diabetes Mother   . Diabetes Brother   . Mental illness Brother   . Alcohol abuse Paternal Uncle   . Alcohol abuse Paternal Grandfather   . Breast cancer Maternal Aunt   . Breast cancer Paternal Aunt   . High blood pressure Sister   . Mental illness Other        runs in family   Family Psychiatric  History: See admission H&P Social History:  Social History   Substance and Sexual Activity  Alcohol Use No     Social History   Substance and Sexual Activity  Drug Use No    Social History   Socioeconomic History  . Marital status: Single    Spouse name: Not on file  . Number of children: Not on file  . Years of education: Not on file  . Highest  education level: Patient refused  Occupational History  . Not on file  Tobacco Use  . Smoking status: Never Smoker  . Smokeless tobacco: Never Used  Vaping Use  . Vaping Use: Never used  Substance and Sexual Activity  . Alcohol use: No  . Drug use: No  . Sexual activity: Not Currently  Other Topics Concern  . Not on file  Social History Narrative  . Not on file   Social Determinants of Health   Financial Resource Strain: Not on file  Food Insecurity: Not on file  Transportation Needs: Not on file  Physical Activity: Not on file  Stress: Not on file  Social Connections: Not on file   Additional Social History:   Sleep:  Fair  Appetite:  Fair  Current Medications: Current Facility-Administered Medications  Medication Dose Route Frequency Provider Last Rate Last Admin  . acetaminophen (TYLENOL) tablet 650 mg  650 mg Oral Q6H PRN Sharma Covert, MD   650 mg at 10/31/20 2157  . alum & mag hydroxide-simeth (MAALOX/MYLANTA) 200-200-20 MG/5ML suspension 30 mL  30 mL Oral Q4H PRN Sharma Covert, MD      . amLODipine (NORVASC) tablet 10 mg  10 mg Oral Daily Sharma Covert, MD   10 mg at 11/05/20 [phone]  . benztropine (COGENTIN) tablet 1 mg  1 mg Oral BID Ival Bible, MD   1 mg at 11/05/20 6294  . carbamazepine (TEGRETOL) chewable tablet 150 mg  150 mg Oral TID Ival Bible, MD   150 mg at 11/05/20 0837  . carvedilol (COREG) tablet 25 mg  25 mg Oral BID WC Sharma Covert, MD   25 mg at 11/05/20 7654  . haloperidol (HALDOL) tablet 10 mg  10 mg Oral Q6H PRN Sharma Covert, MD   10 mg at 11/03/20 2034  . haloperidol (HALDOL) tablet 20 mg  20 mg Oral BID Ival Bible, MD   20 mg at 11/05/20 6503  . hydrOXYzine (ATARAX/VISTARIL) tablet 25 mg  25 mg Oral TID PRN Sharma Covert, MD   25 mg at 11/04/20 2045  . magnesium hydroxide (MILK OF MAGNESIA) suspension 30 mL  30 mL Oral Daily PRN Sharma Covert, MD      . OLANZapine zydis (ZYPREXA) disintegrating tablet 15 mg  15 mg Oral Daily Sharma Covert, MD   15 mg at 11/05/20 0837  . OLANZapine zydis (ZYPREXA) disintegrating tablet 25 mg  25 mg Oral QHS Sharma Covert, MD   25 mg at 11/04/20 2042    Lab Results:  Results for orders placed or performed during the hospital encounter of 10/26/20 (from the past 48 hour(s))  CBC with Differential/Platelet     Status: None   Collection Time: 11/05/20  6:40 AM  Result Value Ref Range   WBC 5.3 4.0 - 10.5 K/uL   RBC 4.53 3.87 - 5.11 MIL/uL   Hemoglobin 12.7 12.0 - 15.0 g/dL   HCT 39.6 36.0 - 46.0 %   MCV 87.4 80.0 - 100.0 fL   MCH 28.0 26.0 - 34.0 pg   MCHC 32.1 30.0 -  36.0 g/dL   RDW 12.5 11.5 - 15.5 %   Platelets 217 150 - 400 K/uL   nRBC 0.0 0.0 - 0.2 %   Neutrophils Relative % 41 %   Neutro Abs 2.2 1.7 - 7.7 K/uL   Lymphocytes Relative 43 %   Lymphs Abs 2.3 0.7 - 4.0 K/uL   Monocytes Relative 8 %  Monocytes Absolute 0.4 0.1 - 1.0 K/uL   Eosinophils Relative 7 %   Eosinophils Absolute 0.4 0.0 - 0.5 K/uL   Basophils Relative 1 %   Basophils Absolute 0.1 0.0 - 0.1 K/uL   Immature Granulocytes 0 %   Abs Immature Granulocytes 0.01 0.00 - 0.07 K/uL    Comment: Performed at [facility], [place] [address], [place], [place] [address]  Comprehensive metabolic panel     Status: Abnormal   Collection Time: 11/05/20  6:40 AM  Result Value Ref Range   Sodium 144 135 - 145 mmol/L   Potassium 4.2 3.5 - 5.1 mmol/L   Chloride 108 98 - 111 mmol/L   CO2 26 22 - 32 mmol/L   Glucose, Bld 116 (H) 70 - 99 mg/dL    Comment: Glucose reference range applies only to samples taken after fasting for at least 8 hours.   BUN 22 (H) 6 - 20 mg/dL   Creatinine, Ser 1.07 (H) 0.44 - 1.00 mg/dL   Calcium 9.7 8.9 - 10.3 mg/dL   Total Protein 6.5 6.5 - 8.1 g/dL   Albumin 3.9 3.5 - 5.0 g/dL   AST 14 (L) 15 - 41 U/L   ALT 20 0 - 44 U/L   Alkaline Phosphatase 86 38 - 126 U/L   Total Bilirubin 0.2 (L) 0.3 - 1.2 mg/dL   GFR, Estimated >60 >60 mL/min    Comment: (NOTE) Calculated using the CKD-EPI Creatinine Equation (2021)    Anion gap 10 5 - 15    Comment: Performed at [facility], [place] [address], [place], [place] [address]  Carbamazepine level, total     Status: None   Collection Time: 11/05/20  6:40 AM  Result Value Ref Range   Carbamazepine Lvl 7.5 4.0 - 12.0 ug/mL    Comment: Performed at [facility] [address], [place], [place] [address]    Blood Alcohol level:  Lab Results  Component Value Date   [facility] <10 10/25/2020   ETH <10 27/51/7001    Metabolic Disorder Labs: Lab Results  Component Value Date   HGBA1C  6.0 (H) 10/26/2020   MPG 125.5 10/26/2020   MPG 111.15 02/08/2020   Lab Results  Component Value Date   PROLACTIN 11.0 03/14/2019   Lab Results  Component Value Date   CHOL 173 10/26/2020   TRIG 158 (H) 10/26/2020   HDL 46 10/26/2020   CHOLHDL 3.8 10/26/2020   VLDL 32 10/26/2020   LDLCALC 95 10/26/2020   LDLCALC 76 02/08/2020    Physical Findings: AIMS: Facial and Oral Movements Muscles of Facial Expression: None, normal Lips and Perioral Area: None, normal Jaw: None, normal Tongue: None, normal,Extremity Movements Upper (arms, wrists, hands, fingers): None, normal Lower (legs, knees, ankles, toes): None, normal, Trunk Movements Neck, shoulders, hips: None, normal, Overall Severity Severity of abnormal movements (highest score from questions above): None, normal Incapacitation due to abnormal movements: None, normal Patient's awareness of abnormal movements (rate only patient's report): No Awareness, Dental Status Current problems with teeth and/or dentures?: No Does patient usually wear dentures?: No  CIWA:    COWS:     Musculoskeletal: Strength & Muscle Tone: within normal limits Gait & Station: shuffle Patient leans: N/A  Psychiatric Specialty Exam: Physical Exam Vitals and nursing note reviewed. Exam conducted with a chaperone present.  HENT:     Head: Normocephalic and atraumatic.  Pulmonary:     Effort: Pulmonary effort is normal.  Neurological:  General: No focal deficit present.     Mental Status: She is alert.     Review of Systems  Blood pressure 136/81, pulse 94, temperature 98.9 F (37.2 C), temperature source Oral, resp. rate 20, height 5\' 1"  (1.549 m), weight 67.1 kg, SpO2 99 %.Body mass index is 27.96 kg/m.  General Appearance: Disheveled  Eye Contact:  Fair  Speech:  Pressured  Volume:  Normal  Mood:  Anxious and Dysphoric  Affect:  Labile  Thought Process:  Disorganized and Descriptions of Associations: Loose  Orientation:  Other:   self  Thought Content:  Delusions, Paranoid Ideation, Rumination and Tangential  Suicidal Thoughts:  No  Homicidal Thoughts:  No  Memory:  Immediate;   Poor Recent;   Poor Remote;   Poor  Judgement:  Impaired  Insight:  Lacking  Psychomotor Activity:  Increased  Concentration:  Concentration: Poor and Attention Span: Poor  Recall:  Poor  Fund of Knowledge:  Fair  Language:  Fair  Akathisia:  Yes  Handed:  Right  AIMS (if indicated):     Assets:  Desire for Improvement Resilience  ADL's:  Intact  Cognition:  WNL  Sleep:  Number of Hours: 5.75     Treatment Plan Summary: Daily contact with patient to assess and evaluate symptoms and progress in treatment, Medication management and Plan : Patient is seen and examined.  Patient is a 54 year old female with the above-stated past psychiatric history who is seen in follow-up.   Diagnosis: 1. Schizoaffective disorder; bipolar type 2. Hypertension 3. Hyperthyroidism 4. Diabetes mellitus type 2 5. Multiple thyroid nodules 6. Multinodular goiter  Pertinent findings on examination today: 1.  Patient remains disorganized, psychotic, tangential. 2.  Sleep is only mildly improved. 3. Carbamazepine level 12.9- mildly elevated, dose was decreased from 200 TID to 150 TID  4. Carbamazepine level, CBC, CMP ordered for AM of 12/9 to  Monitor level with decreased dose   Plan: 1.  Continue amlodipine 10 mg p.o. daily for hypertension. 2.  ContinueTegretol 150 mg p.o. 3 times daily for mood stability as level slightly elevated. 3.  Continue carvedilol 25 mg p.o. twice daily with food for hypertension and rate control. 4.  Continue Haldol 20 mg BID p.o. .  This is for psychosis. 5.  Continue hydroxyzine 25 mg p.o. 3 times daily as needed anxiety. 6.  continue olanzapine to 15 mg p.o. daily and 25 mg p.o. nightly for psychosis and mood stabilization. 7.  Patient will require biopsy of thyroid nodules after stabilization of mood and  improvement in psychosis. 8.  continue Cogentin 1  mg p.o. twice daily for tremor and possible EPS 9. Attempting to obtain collateral to determine patient baseline 10.  Disposition planning-in progress.   Ethelene Hal, NP 11/05/2020, 9:52 AM

## 2020-11-06 DIAGNOSIS — R4182 Altered mental status, unspecified: Secondary | ICD-10-CM

## 2020-11-06 DIAGNOSIS — F3164 Bipolar disorder, current episode mixed, severe, with psychotic features: Secondary | ICD-10-CM

## 2020-11-06 DIAGNOSIS — R631 Polydipsia: Secondary | ICD-10-CM

## 2020-11-06 DIAGNOSIS — F54 Psychological and behavioral factors associated with disorders or diseases classified elsewhere: Secondary | ICD-10-CM

## 2020-11-06 MED ORDER — LORAZEPAM 1 MG PO TABS
2.0000 mg | ORAL_TABLET | Freq: Once | ORAL | Status: AC
  Administered 2020-11-06: 2 mg via ORAL
  Filled 2020-11-06: qty 2

## 2020-11-06 NOTE — [facility] Group Notes (Signed)
  [facility] LCSW Group Therapy Note  Date/Time:  11/06/2020 11:15AM-12:00PM  Type of Therapy and Topic:  Group Therapy:  Feelings About Hospitalization  Participation Level:  Active   Description of Group This process group involved patients discussing their feelings related to being hospitalized, as well as the benefits they see to being in the hospital.  These feelings and benefits were itemized.  The group then brainstormed specific ways in which they could seek those same benefits when they discharge and return home.  Therapeutic Goals 1. Patient will identify and describe positive and negative feelings related to hospitalization 2. Patient will verbalize benefits of hospitalization to themselves personally 3. Patients will brainstorm together ways they can obtain similar benefits in the outpatient setting, identify barriers to wellness and possible solutions  Summary of Patient Progress:  The patient expressed her primary feelings about being hospitalized are basically that it is okay if you need it.  Her answers to all questions were rambling and lengthy.  When asked what she is going to do to stay well and out of the hospital at discharge, she responded irritably, "Why do I need to tell you?  That's my final answer."  She talked later about not knowing who her family is.  Flight of ideas was apparent throughout group.    Therapeutic Modalities Cognitive Behavioral Therapy Motivational Interviewing    Selmer Dominion, LCSW 11/06/2020, 1:33 PM

## 2020-11-06 NOTE — Progress Notes (Addendum)
Nursing 1:1 close observation note D:Pt seen at med window. Pt agitated and wanting to walk the halls. Pt given Haldol 10 mg PO.  A: 1:1 close observation continues for safety  R: pt remains safe

## 2020-11-06 NOTE — Progress Notes (Signed)
Pt out pacing the halls and agitated. Rapid tangential speech. Contacted provider and given one-time dose of Ativan 2 mg PO. Will administer and continue to monitor.

## 2020-11-06 NOTE — Progress Notes (Addendum)
   11/06/20 2024  Psych Admission Type (Psych Patients Only)  Admission Status Voluntary  Psychosocial Assessment  Patient Complaints None  Eye Contact Intense;Watchful  Facial Expression Anxious  Affect Anxious  Speech Pressured;Tangential;Rhyming;Slurred  Interaction Assertive;Childlike  Motor Activity Fidgety;Restless  Appearance/Hygiene Unremarkable  Behavior Characteristics Cooperative  Mood Preoccupied  Thought Process  Coherency Disorganized;Flight of ideas  Content Obsessions;Paranoia  Delusions Paranoid;Religious  Perception Derealization  Hallucination None reported or observed  Judgment Limited  Confusion Mild  Danger to Self  Current suicidal ideation? Denies  Danger to Others  Danger to Others None reported or observed   Pt denies SI, HI, AVH. Rates pain 5/10 in her right eye that "comes and goes." Refused any medication. "I may have a cataract. My father has one. He's 36 years old. He won't do anything about it." Pt logical/coherent but quickly becomes tangential, flight of ideas, disorganized in thinking.  Pt says that she would like to speak to a chaplain today and have them pray for her.

## 2020-11-06 NOTE — Progress Notes (Signed)
[facility] MD Progress Note  11/06/2020 4:27 PM Paula Dickson  MRN:  [id number] Subjective:  Patient is a 54 year old female with a reported past psychiatric history significant for schizoaffective disorder; bipolar type who originally presented to the [facility] emergency department on 10/25/2020 after her family members had brought her to the hospital because of worsening manic symptoms.  Today:  Chart reviewed and patient discussed with treatment team. Patient is medication complaint, she has received no PRN medications in the last 24 hours.  Patient interviewed this AM in her room; she continues to be disorganized, tangential, and hyperreligious. She had to be placed back on 1:1 for being intrusive when another patient got upset on the unit yesterday. She is clear about the fact that she wants to be discharged. She is able to have a short, semi-coherent conversation but remains tangential with rambling, pressured speech.  She has noticeable tremors to bilateral hands.  She denies suicidal and homicidal ideation. She denies auditory and visual hallucinations. She is observed on the unit talking on the phone. She attends group therapy but is verbally intrusive. .  Contracts for safety. She becomes increasingly disorganized as interview progresses.  Pt receiving less PRNs with increased haldol dose. Will obtain EKG today, to check QTc interval since patient is on several QTc prolonging medications. Continue to monitor for safety and medication side effects.  Again she has the history of hyperthyroidism.  She has a multinodular goiter.  She has at least 2 nodules which appear to require biopsy.  Principal Problem: <principal problem not specified> Diagnosis: Active Problems:   Psychogenic polydipsia   Schizophrenia (HCC)   Bipolar affective disorder, current episode mixed ([place])   AMS (altered mental status)  Total Time spent with patient: 20 minutes  Past Psychiatric  History: See admission H&P  Past Medical History:  Past Medical History:  Diagnosis Date  . Bipolar affective disorder ([place])   . History of arthritis   . History of chicken pox   . History of depression   . History of genital warts   . history of heart murmur   . History of high blood pressure   . History of thyroid disease   . History of UTI   . Hypertension   . Low TSH level 07/13/2017  . Schizophrenia [facility])     Past Surgical History:  Procedure Laterality Date  . ABLATION ON ENDOMETRIOSIS    . CYST REMOVAL NECK     around 11 years ago /benign  . MULTIPLE TOOTH EXTRACTIONS     Family History:  Family History  Problem Relation Age of Onset  . Arthritis Father   . Hyperlipidemia Father   . High blood pressure Father   . Diabetes Sister   . Diabetes Mother   . Diabetes Brother   . Mental illness Brother   . Alcohol abuse Paternal Uncle   . Alcohol abuse Paternal Grandfather   . Breast cancer Maternal Aunt   . Breast cancer Paternal Aunt   . High blood pressure Sister   . Mental illness Other        runs in family   Family Psychiatric  History: See admission H&P Social History:  Social History   Substance and Sexual Activity  Alcohol Use No     Social History   Substance and Sexual Activity  Drug Use No    Social History   Socioeconomic History  . Marital status: Single    Spouse name: Not on file  .  Number of children: Not on file  . Years of education: Not on file  . Highest education level: Patient refused  Occupational History  . Not on file  Tobacco Use  . Smoking status: Never Smoker  . Smokeless tobacco: Never Used  Vaping Use  . Vaping Use: Never used  Substance and Sexual Activity  . Alcohol use: No  . Drug use: No  . Sexual activity: Not Currently  Other Topics Concern  . Not on file  Social History Narrative  . Not on file   Social Determinants of Health   Financial Resource Strain: Not on file  Food Insecurity: Not on file   Transportation Needs: Not on file  Physical Activity: Not on file  Stress: Not on file  Social Connections: Not on file   Additional Social History:   Sleep: Fair  Appetite:  Fair  Current Medications: Current Facility-Administered Medications  Medication Dose Route Frequency Provider Last Rate Last Admin  . acetaminophen (TYLENOL) tablet 650 mg  650 mg Oral Q6H PRN Sharma Covert, MD   650 mg at 10/31/20 2157  . alum & mag hydroxide-simeth (MAALOX/MYLANTA) 200-200-20 MG/5ML suspension 30 mL  30 mL Oral Q4H PRN Sharma Covert, MD      . amLODipine (NORVASC) tablet 10 mg  10 mg Oral Daily Sharma Covert, MD   10 mg at 11/06/20 0745  . benztropine (COGENTIN) tablet 1 mg  1 mg Oral BID Ival Bible, MD   1 mg at 11/06/20 1619  . carbamazepine (TEGRETOL) chewable tablet 150 mg  150 mg Oral TID Ival Bible, MD   150 mg at 11/06/20 1619  . carvedilol (COREG) tablet 25 mg  25 mg Oral BID WC Sharma Covert, MD   25 mg at 11/06/20 1619  . haloperidol (HALDOL) tablet 10 mg  10 mg Oral Q6H PRN Sharma Covert, MD   10 mg at 11/05/20 2215  . haloperidol (HALDOL) tablet 20 mg  20 mg Oral BID Ival Bible, MD   20 mg at 11/06/20 1618  . hydrOXYzine (ATARAX/VISTARIL) tablet 25 mg  25 mg Oral TID PRN Sharma Covert, MD   25 mg at 11/05/20 2047  . magnesium hydroxide (MILK OF MAGNESIA) suspension 30 mL  30 mL Oral Daily PRN Sharma Covert, MD      . OLANZapine zydis (ZYPREXA) disintegrating tablet 15 mg  15 mg Oral Daily Sharma Covert, MD   15 mg at 11/06/20 0745  . OLANZapine zydis (ZYPREXA) disintegrating tablet 25 mg  25 mg Oral QHS Sharma Covert, MD   25 mg at 11/05/20 2047    Lab Results:  Results for orders placed or performed during the hospital encounter of 10/26/20 (from the past 48 hour(s))  CBC with Differential/Platelet     Status: None   Collection Time: 11/05/20  6:40 AM  Result Value Ref Range   WBC 5.3 4.0 - 10.5 K/uL    RBC 4.53 3.87 - 5.11 MIL/uL   Hemoglobin 12.7 12.0 - 15.0 g/dL   HCT 39.6 36.0 - 46.0 %   MCV 87.4 80.0 - 100.0 fL   MCH 28.0 26.0 - 34.0 pg   MCHC 32.1 30.0 - 36.0 g/dL   RDW 12.5 11.5 - 15.5 %   Platelets 217 150 - 400 K/uL   nRBC 0.0 0.0 - 0.2 %   Neutrophils Relative % 41 %   Neutro Abs 2.2 1.7 - 7.7 K/uL   Lymphocytes  Relative 43 %   Lymphs Abs 2.3 0.7 - 4.0 K/uL   Monocytes Relative 8 %   Monocytes Absolute 0.4 0.1 - 1.0 K/uL   Eosinophils Relative 7 %   Eosinophils Absolute 0.4 0.0 - 0.5 K/uL   Basophils Relative 1 %   Basophils Absolute 0.1 0.0 - 0.1 K/uL   Immature Granulocytes 0 %   Abs Immature Granulocytes 0.01 0.00 - 0.07 K/uL    Comment: Performed at [facility], [place] [address], [place], [place] [address]  Comprehensive metabolic panel     Status: Abnormal   Collection Time: 11/05/20  6:40 AM  Result Value Ref Range   Sodium 144 135 - 145 mmol/L   Potassium 4.2 3.5 - 5.1 mmol/L   Chloride 108 98 - 111 mmol/L   CO2 26 22 - 32 mmol/L   Glucose, Bld 116 (H) 70 - 99 mg/dL    Comment: Glucose reference range applies only to samples taken after fasting for at least 8 hours.   BUN 22 (H) 6 - 20 mg/dL   Creatinine, Ser 1.07 (H) 0.44 - 1.00 mg/dL   Calcium 9.7 8.9 - 10.3 mg/dL   Total Protein 6.5 6.5 - 8.1 g/dL   Albumin 3.9 3.5 - 5.0 g/dL   AST 14 (L) 15 - 41 U/L   ALT 20 0 - 44 U/L   Alkaline Phosphatase 86 38 - 126 U/L   Total Bilirubin 0.2 (L) 0.3 - 1.2 mg/dL   GFR, Estimated >60 >60 mL/min    Comment: (NOTE) Calculated using the CKD-EPI Creatinine Equation (2021)    Anion gap 10 5 - 15    Comment: Performed at [facility], [place] [address] [place], [place] [address]  Carbamazepine level, total     Status: None   Collection Time: 11/05/20  6:40 AM  Result Value Ref Range   Carbamazepine Lvl 7.5 4.0 - 12.0 ug/mL    Comment: Performed at [place] [address], [place], [place] [address]    Blood Alcohol  level:  Lab Results  Component Value Date   [facility] <10 10/25/2020   ETH <10 94/70/9628    Metabolic Disorder Labs: Lab Results  Component Value Date   HGBA1C 6.0 (H) 10/26/2020   MPG 125.5 10/26/2020   MPG 111.15 02/08/2020   Lab Results  Component Value Date   PROLACTIN 11.0 03/14/2019   Lab Results  Component Value Date   CHOL 173 10/26/2020   TRIG 158 (H) 10/26/2020   HDL 46 10/26/2020   CHOLHDL 3.8 10/26/2020   VLDL 32 10/26/2020   LDLCALC 95 10/26/2020   LDLCALC 76 02/08/2020    Physical Findings: AIMS: Facial and Oral Movements Muscles of Facial Expression: None, normal Lips and Perioral Area: None, normal Jaw: None, normal Tongue: None, normal,Extremity Movements Upper (arms, wrists, hands, fingers): None, normal Lower (legs, knees, ankles, toes): None, normal, Trunk Movements Neck, shoulders, hips: None, normal, Overall Severity Severity of abnormal movements (highest score from questions above): None, normal Incapacitation due to abnormal movements: None, normal Patient's awareness of abnormal movements (rate only patient's report): No Awareness, Dental Status Current problems with teeth and/or dentures?: No Does patient usually wear dentures?: No  CIWA:    COWS:     Musculoskeletal: Strength & Muscle Tone: within normal limits Gait & Station: shuffle Patient leans: N/A  Psychiatric Specialty Exam: Physical Exam Vitals and nursing note reviewed. Exam conducted with a chaperone present.  HENT:  Head: Normocephalic and atraumatic.  Pulmonary:     Effort: Pulmonary effort is normal.  Musculoskeletal:     Cervical back: Normal range of motion.  Neurological:     General: No focal deficit present.     Mental Status: She is alert.  Psychiatric:        Attention and Perception: She is inattentive.        Speech: Speech is tangential.     Review of Systems  Constitutional: Negative for activity change and appetite change.  Respiratory: Negative  for chest tightness and shortness of breath.   Cardiovascular: Negative for chest pain.  Gastrointestinal: Negative for abdominal pain.  Neurological: Negative for facial asymmetry and headaches.   Blood pressure (!) 143/90, pulse 84, temperature 98.5 F (36.9 C), temperature source Oral, resp. rate (!) 88, height 5\' 1"  (1.549 m), weight 67.1 kg, SpO2 99 %.Body mass index is 27.96 kg/m.  General Appearance: Disheveled  Eye Contact:  Fair  Speech:  Pressured and rambling  Volume:  Normal  Mood:  Anxious  Affect:  Inappropriate and Labile  Thought Process:  Disorganized and Descriptions of Associations: Loose  Orientation:  Other:  self  Thought Content:  Delusions, Rumination and Tangential  Suicidal Thoughts:  No  Homicidal Thoughts:  No  Memory:  Immediate;   Poor Recent;   Poor Remote;   Poor  Judgement:  Impaired  Insight:  Lacking  Psychomotor Activity:  Increased  Concentration:  Concentration: Poor and Attention Span: Poor  Recall:  Poor  Fund of Knowledge:  Fair  Language:  Fair  Akathisia:  Yes  Handed:  Right  AIMS (if indicated):     Assets:  Desire for Improvement Resilience  ADL's:  Intact  Cognition:  WNL  Sleep:  Number of Hours: 4.75     Treatment Plan Summary: Daily contact with patient to assess and evaluate symptoms and progress in treatment, Medication management and Plan : Patient is seen and examined.  Patient is a 55 year old female with the above-stated past psychiatric history who is seen in follow-up.   Diagnosis: 1. Schizoaffective disorder; bipolar type 2. Hypertension 3. Hyperthyroidism 4. Diabetes mellitus type 2 5. Multiple thyroid nodules 6. Multinodular goiter  Pertinent findings on examination today: 1.  Patient remains disorganized, psychotic, tangential. 2.  Sleep is only mildly improved. 3. Carbamazepine level 12.9- mildly elevated, dose was decreased from 200 TID to 150 TID  4. Carbamazepine level, CBC, CMP ordered for  AM of 12/9 to  Monitor level with decreased dose  Plan: 1.  Continue amlodipine 10 mg p.o. daily for hypertension. 2.  Continue Tegretol 150 mg p.o. 3 times daily for mood stability as level slightly elevated. 12/10 level improved to 7.5 with lowered dose. 3.  Continue carvedilol 25 mg p.o. twice daily with food for hypertension and rate control. 4.  Continue Haldol 20 mg BID p.o. for psychosis. 5.  Continue hydroxyzine 25 mg p.o. 3 times daily as needed anxiety. 6.  Continue olanzapine to 15 mg p.o. daily and 25 mg p.o. nightly for psychosis and mood stabilization. 7.  Patient will require biopsy of thyroid nodules after stabilization of mood and improvement in psychosis. 8.  Continue Cogentin 1  mg p.o. twice daily for tremor and possible EPS 9. Attempting to obtain collateral to determine patient baseline 10. Disposition planning-in progress.   Ethelene Hal, NP 11/06/2020, 4:27 PM

## 2020-11-06 NOTE — Progress Notes (Signed)
Adult Psychoeducational Group Note  Date:  11/06/2020 Time:  10:48 PM  Group Topic/Focus:  Wrap-Up Group:   The focus of this group is to help patients review their daily goal of treatment and discuss progress on daily workbooks.  Participation Level:  Minimal  Participation Quality:  Appropriate  Affect:  Appropriate  Cognitive:  Disorganized and Confused  Insight: Lacking and Limited  Engagement in Group:  Lacking and Limited  Modes of Intervention:  Discussion  Additional Comments:  Pt stated her goal for today was to focus on her treatment plan. Pt stated she felt she accomplished her goal today. Pt stated she talk with her doctor and social worker, regarding her care today. Pt stated she took all her medication today from her providers. Pt stated been able to contact her brother, sister, and cousin today improved her overall day. Pt stated her relationship with her family and support team needs to be improved. Pt rated her overall day a 8 out of 10 today. Pt stated she felt better about herself today. Pt stated her appetite was pretty good today and she was brought back all meals today because she is on C/O. Pt stated her sleep last night was good. Pt stated the goal for tonight was to get some rest. Pt stated she was in some physical pain. Pt stated her right shoulder was in some pain. Pt rated the pain level a 2. Pt nurse was updated on situation.  Pt stated she was experiencing some auditory and visual hallucinations. Pt stated the last time she experience this issues was about 48mins ago. Pt nurse was made aware of situation. Pt denies thoughts of harming herself or others. Pt stated she would alert staff if anything changes.  [name] 11/06/2020, 10:48 PM

## 2020-11-06 NOTE — Progress Notes (Signed)
Close observation: Patient on close observation for safety.  Medication given as prescribed.  Denies suicidal thoughts, auditory and visual hallucinations.  Attended group and participated.  Patient visible in milieu interacting with peers and staff.  Patient still disorganized with tangential speech and thought process.  Patient is safe on the unit.  Routine safety checks maintained.

## 2020-11-06 NOTE — Progress Notes (Signed)
Close Observation:  Patient on close observation while awake for safety. Current in room sitting on bench drinking water. Denies suicidal thoughts, homicidial thoughts and AVH. She has been visible in milieu interacting with peers and using telephone with disorganized thoughts and tangential speech. Scheduled medications administered to patient per MD orders. Reassurance, support and encouragement provided. Patient adhered to medication administration. No adverse drug reactions noted. Remains safe at this time, will continue to monitor.

## 2020-11-06 NOTE — Progress Notes (Signed)
Close observation: Patient maintained on close observation for safety.  Patient visible in milieu interacting with staff and peers.  Routine safety checks maintained.  Patient is safe on the unit.

## 2020-11-06 NOTE — [facility] Group Notes (Signed)
.  Psychoeducational Group Note  Date: 11-06-20 Time: 0900-1000    Goal Setting   Purpose of Group: This group helps to provide patients with the steps of setting a goal that is specific, measurable, attainable, realistic and time specific. A discussion on how we keep ourselves stuck with negative self talk.    Participation Level:  Verbally intrussive  Participation Quality:  intrussive  Affect:  Appropriate  Cognitive:  limited  Insight:  lacking  Engagement in Group:  Wanting to take over the group. Intrusive with her peers  Additional Comments:  .Marland KitchenMarland KitchenPt is doing better overall but remains intrussive and hyperverbal.  [name]

## 2020-11-07 MED ORDER — TEMAZEPAM 30 MG PO CAPS
30.0000 mg | ORAL_CAPSULE | Freq: Every day | ORAL | Status: DC
  Administered 2020-11-07 – 2020-11-11 (×5): 30 mg via ORAL
  Filled 2020-11-07 (×5): qty 1

## 2020-11-07 NOTE — Progress Notes (Signed)
Pt awake in bed asleep at this time. Respirations noted and unlabored. Pt has not exhibited any behavioral outburst thus far this shift. Continued support and encouragement offered. Q 15 minutes safety checks maintained with Close observation as ordered.  Pt appears to be in no physical distress at this time. Tolerated lunch and fluids well when offered.

## 2020-11-07 NOTE — Progress Notes (Signed)
Adult Psychoeducational Group Note  Date:  11/07/2020 Time:  9:36 PM  Group Topic/Focus:  Wrap-Up Group:   The focus of this group is to help patients review their daily goal of treatment and discuss progress on daily workbooks.  Participation Level:  Active  Participation Quality:  Appropriate  Affect:  Anxious  Cognitive:  Disorganized and Confused  Insight: Limited  Engagement in Group:  Limited  Modes of Intervention:  Discussion  Additional Comments:   Pt stated her goal for today was to focus on her treatment plan. Pt stated she felt she accomplished her goal today. Pt stated she talk with her doctor and social worker, regarding her care today. Pt stated she took all her medication today from her providers. Pt stated been able to contact her brother, and sister today improved her overall day. Pt stated her relationship with her family and support team needs to be improved. Pt rated her overall day a 10 today. Pt stated she felt better about herself today. Pt stated her appetite was pretty good today and she was brought back all meals today because she is on C/O. Pt stated her sleep last night was good. Pt stated the goal for tonight was to get some rest. Pt stated she was no physical pain. Pt deny auditory or visual hallucinations issues today.  Pt denies thoughts of harming herself or others. Pt stated she would alert staff if anything changes.  [name] 11/07/2020, 9:36 PM

## 2020-11-07 NOTE — Progress Notes (Signed)
[facility] MD Progress Note  11/07/2020 1:38 PM Paula Dickson [place]  MRN:  [id number] Subjective:  Patient is a 54 year old female with a reported past psychiatric history significant for schizoaffective disorder; bipolar type who originally presented to the [facility] emergency department on 10/25/2020 after her family members had brought her to the hospital because of worsening manic symptoms.  Today:  Chart reviewed and patient discussed with treatment team. Patient is medication complaint, she received PRN medications last night for intrusive behavior. Patient continues to be disorganized, tangential, and hyperreligious. She had to be placed back on 1:1 for being intrusive when another patient got upset on the unit yesterday. She is clear about the fact that she wants to be discharged. She is able to have a short, semi-coherent conversation but remains tangential with rambling, pressured speech.  She has noticeable tremors to bilateral hands. She is attending group therapy but is intrusive. Today she was observed in the dayroom participating in music therapy. She is not sleeping very well, 4.25 hours last night. Will add temazepam 30 mg to evening medications to facilitate better sleep. Micromedex interaction check performed with no concerns for drug-drug interactions.  She denies suicidal and homicidal ideation. She denies auditory and visual hallucinations. She is observed on the unit talking on the phone. She attends group therapy but is verbally intrusive. .  Contracts for safety. She becomes increasingly disorganized as interview progresses.  EKG obtained today with QTc 445 and NSR. Continue to monitor for safety and medication side effects.  Again she has the history of hyperthyroidism.  She has a multinodular goiter.  She has at least 2 nodules which appear to require biopsy.  Principal Problem: <principal problem not specified> Diagnosis: Active Problems:   Psychogenic  polydipsia   Schizophrenia (HCC)   Bipolar affective disorder, current episode mixed ([place])   AMS (altered mental status)  Total Time spent with patient: 20 minutes  Past Psychiatric History: See admission H&P  Past Medical History:  Past Medical History:  Diagnosis Date  . Bipolar affective disorder ([place])   . History of arthritis   . History of chicken pox   . History of depression   . History of genital warts   . history of heart murmur   . History of high blood pressure   . History of thyroid disease   . History of UTI   . Hypertension   . Low TSH level 07/13/2017  . Schizophrenia [facility])     Past Surgical History:  Procedure Laterality Date  . ABLATION ON ENDOMETRIOSIS    . CYST REMOVAL NECK     around 11 years ago /benign  . MULTIPLE TOOTH EXTRACTIONS     Family History:  Family History  Problem Relation Age of Onset  . Arthritis Father   . Hyperlipidemia Father   . High blood pressure Father   . Diabetes Sister   . Diabetes Mother   . Diabetes Brother   . Mental illness Brother   . Alcohol abuse Paternal Uncle   . Alcohol abuse Paternal Grandfather   . Breast cancer Maternal Aunt   . Breast cancer Paternal Aunt   . High blood pressure Sister   . Mental illness Other        runs in family   Family Psychiatric  History: See admission H&P Social History:  Social History   Substance and Sexual Activity  Alcohol Use No     Social History   Substance and Sexual Activity  Drug Use No    Social History   Socioeconomic History  . Marital status: Single    Spouse name: Not on file  . Number of children: Not on file  . Years of education: Not on file  . Highest education level: Patient refused  Occupational History  . Not on file  Tobacco Use  . Smoking status: Never Smoker  . Smokeless tobacco: Never Used  Vaping Use  . Vaping Use: Never used  Substance and Sexual Activity  . Alcohol use: No  . Drug use: No  . Sexual activity: Not Currently   Other Topics Concern  . Not on file  Social History Narrative  . Not on file   Social Determinants of Health   Financial Resource Strain: Not on file  Food Insecurity: Not on file  Transportation Needs: Not on file  Physical Activity: Not on file  Stress: Not on file  Social Connections: Not on file   Additional Social History:   Sleep: Fair  Appetite:  Fair  Current Medications: Current Facility-Administered Medications  Medication Dose Route Frequency Provider Last Rate Last Admin  . acetaminophen (TYLENOL) tablet 650 mg  650 mg Oral Q6H PRN Sharma Covert, MD   650 mg at 10/31/20 2157  . alum & mag hydroxide-simeth (MAALOX/MYLANTA) 200-200-20 MG/5ML suspension 30 mL  30 mL Oral Q4H PRN Sharma Covert, MD      . amLODipine (NORVASC) tablet 10 mg  10 mg Oral Daily Sharma Covert, MD   10 mg at 11/07/20 0750  . benztropine (COGENTIN) tablet 1 mg  1 mg Oral BID Ival Bible, MD   1 mg at 11/07/20 0750  . carbamazepine (TEGRETOL) chewable tablet 150 mg  150 mg Oral TID Ival Bible, MD   150 mg at 11/07/20 1236  . carvedilol (COREG) tablet 25 mg  25 mg Oral BID WC Sharma Covert, MD   25 mg at 11/07/20 0750  . haloperidol (HALDOL) tablet 10 mg  10 mg Oral Q6H PRN Sharma Covert, MD   10 mg at 11/06/20 2159  . haloperidol (HALDOL) tablet 20 mg  20 mg Oral BID Ival Bible, MD   20 mg at 11/07/20 0750  . hydrOXYzine (ATARAX/VISTARIL) tablet 25 mg  25 mg Oral TID PRN Sharma Covert, MD   25 mg at 11/06/20 2053  . magnesium hydroxide (MILK OF MAGNESIA) suspension 30 mL  30 mL Oral Daily PRN Sharma Covert, MD      . OLANZapine zydis (ZYPREXA) disintegrating tablet 15 mg  15 mg Oral Daily Sharma Covert, MD   15 mg at 11/07/20 0751  . OLANZapine zydis (ZYPREXA) disintegrating tablet 25 mg  25 mg Oral QHS Sharma Covert, MD   25 mg at 11/06/20 2053    Lab Results:  No results found for this or any previous visit (from the  past 48 hour(s)).  Blood Alcohol level:  Lab Results  Component Value Date   ETH <10 10/25/2020   ETH <10 37/85/8850    Metabolic Disorder Labs: Lab Results  Component Value Date   HGBA1C 6.0 (H) 10/26/2020   MPG 125.5 10/26/2020   MPG 111.15 02/08/2020   Lab Results  Component Value Date   PROLACTIN 11.0 03/14/2019   Lab Results  Component Value Date   CHOL 173 10/26/2020   TRIG 158 (H) 10/26/2020   HDL 46 10/26/2020   CHOLHDL 3.8 10/26/2020   VLDL 32 10/26/2020  [place] 95 10/26/2020   LDLCALC 76 02/08/2020    Physical Findings: AIMS: Facial and Oral Movements Muscles of Facial Expression: None, normal Lips and Perioral Area: None, normal Jaw: None, normal Tongue: None, normal,Extremity Movements Upper (arms, wrists, hands, fingers): None, normal Lower (legs, knees, ankles, toes): None, normal, Trunk Movements Neck, shoulders, hips: None, normal, Overall Severity Severity of abnormal movements (highest score from questions above): None, normal Incapacitation due to abnormal movements: None, normal Patient's awareness of abnormal movements (rate only patient's report): No Awareness, Dental Status Current problems with teeth and/or dentures?: No Does patient usually wear dentures?: No  CIWA:    COWS:     Musculoskeletal: Strength & Muscle Tone: within normal limits Gait & Station: shuffle Patient leans: N/A  Psychiatric Specialty Exam: Physical Exam Vitals and nursing note reviewed. Exam conducted with a chaperone present.  HENT:     Head: Normocephalic and atraumatic.  Pulmonary:     Effort: Pulmonary effort is normal.  Musculoskeletal:     Cervical back: Normal range of motion.  Neurological:     General: No focal deficit present.     Mental Status: She is alert.  Psychiatric:        Attention and Perception: She is inattentive.        Speech: Speech is tangential.     Review of Systems  Constitutional: Negative for activity change and  appetite change.  Respiratory: Negative for chest tightness and shortness of breath.   Cardiovascular: Negative for chest pain.  Gastrointestinal: Negative for abdominal pain.  Neurological: Negative for facial asymmetry and headaches.   Blood pressure (!) 137/106, pulse 91, temperature 98.3 F (36.8 C), temperature source Oral, resp. rate 20, height 5\' 1"  (1.549 m), weight 67.1 kg, SpO2 99 %.Body mass index is 27.96 kg/m.  General Appearance: Disheveled  Eye Contact:  Fair  Speech:  Pressured and rambling  Volume:  Normal  Mood:  Anxious  Affect:  Inappropriate and Labile  Thought Process:  Disorganized and Descriptions of Associations: Loose  Orientation:  Other:  self  Thought Content:  Delusions, Rumination and Tangential  Suicidal Thoughts:  No  Homicidal Thoughts:  No  Memory:  Immediate;   Poor Recent;   Poor Remote;   Poor  Judgement:  Impaired  Insight:  Lacking  Psychomotor Activity:  Increased  Concentration:  Concentration: Poor and Attention Span: Poor  Recall:  Poor  Fund of Knowledge:  Fair  Language:  Fair  Akathisia:  Yes  Handed:  Right  AIMS (if indicated):     Assets:  Desire for Improvement Resilience  ADL's:  Intact  Cognition:  WNL  Sleep:  Number of Hours: 4.25     Treatment Plan Summary: Daily contact with patient to assess and evaluate symptoms and progress in treatment, Medication management and Plan : Patient is seen and examined.  Patient is a 54 year old female with the above-stated past psychiatric history who is seen in follow-up.   Diagnosis: 1. Schizoaffective disorder; bipolar type 2. Hypertension 3. Hyperthyroidism 4. Diabetes mellitus type 2 5. Multiple thyroid nodules 6. Multinodular goiter  Pertinent findings on examination today: 1.  Patient remains disorganized, psychotic, tangential. 2.  Sleep is only mildly improved. 3. Carbamazepine level 12.9- mildly elevated, dose was decreased from 200 TID to 150 TID  4.  Carbamazepine level, CBC, CMP ordered for AM of 12/9 to  Monitor level with decreased dose 5. EKG obtained on 11/07/2020: NSR with QTc 429.   Plan: 1.  Continue amlodipine  10 mg p.o. daily for hypertension. 2.  Continue Tegretol 150 mg p.o. 3 times daily for mood stability as level slightly elevated. 12/10 level improved to 7.5 with lowered dose. 3.  Continue carvedilol 25 mg p.o. twice daily with food for hypertension and rate control. 4.  Continue Haldol 20 mg BID p.o. for psychosis. 5.  Continue hydroxyzine 25 mg p.o. 3 times daily as needed anxiety. 6.  Continue olanzapine to 15 mg p.o. daily and 25 mg p.o. nightly for psychosis and mood stabilization. 7. Add Restoril 30 mg PO QHS for sleep 8.  Patient will require biopsy of thyroid nodules after stabilization of mood and improvement in psychosis. 9.  Continue Cogentin 1  mg p.o. twice daily for tremor and possible EPS 10. Attempting to obtain collateral to determine patient baseline 11. Disposition planning-in progress.   Ethelene Hal, NP 11/07/2020, 1:38 PM

## 2020-11-07 NOTE — Progress Notes (Signed)
Patient has been up in the dayroom watching tv. She has been up at the nursing station several times talking with Probation officer. She remains tangential in conversation and flight of ideas. At times is difficult to understand. She was compliant with her scheduled medications. She has been polite and cooperative. Safety maintained ans close observation continues.

## 2020-11-07 NOTE — Progress Notes (Signed)
Pt just got up and wandered into the hallway. Pt redirected back to room and put back to bed. Will continue to monitor.

## 2020-11-07 NOTE — Progress Notes (Addendum)
Nursing 1:1 close observation note D:Pt observed sitting on side of bed after being redirected back into her room.  A: 1:1 close observation continues for safety  R: pt remains safe

## 2020-11-07 NOTE — Progress Notes (Signed)
   11/07/20 2045  COVID-19 Daily Checkoff  Have you had a fever (temp > 37.80C/100F)  in the past 24 hours?  No  If you have had runny nose, nasal congestion, sneezing in the past 24 hours, has it worsened? No  COVID-19 EXPOSURE  Have you traveled outside the state in the past 14 days? No  Have you been in contact with someone with a confirmed diagnosis of COVID-19 or PUI in the past 14 days without wearing appropriate PPE? No  Have you been living in the same home as a person with confirmed diagnosis of COVID-19 or a PUI (household contact)? No  Have you been diagnosed with COVID-19? No

## 2020-11-07 NOTE — Progress Notes (Signed)
Pt observed in bed, awake on initial encounter. Presents disorganized with fair eye contact, pressured, tangential speech and ambulatory with a steady gait. Denies SI, HI and AH hallucinations when assessed. Endorsed VH stating "I see feathers when I turn around they disappear and I know it is not what it is". Assertive and hyper-religious on interactions "I know I'm going to [place] to meet my mommy & daddy. We all going to go". Pt remains compliant with medications when offered. Denies discomfort when assessed.  Support and encouragement offered. Medications given with verbal education and effects monitored. Close Observation continues as ordered. Assigned MHT staff in attendance at all times. Reassurance and continued verbal redirections offered for intrusive behaviors.  Pt remains safe on unit. Denies concerns at this time. Tolerates all PO intake well.

## 2020-11-07 NOTE — BHH Group Notes (Signed)
[facility] LCSW Group Therapy Note  Date/Time:  11/07/2020  11:00AM-12:00PM  Type of Therapy and Topic:  Group Therapy:  Music and Mood  Participation Level:  Minimal   Description of Group: In this process group, members listened to a variety of genres of music and identified that different types of music evoke different responses.  Patients were encouraged to identify music that was soothing for them and music that was energizing for them.  Patients discussed how this knowledge can help with wellness and recovery in various ways including managing depression and anxiety as well as encouraging healthy sleep habits.    Therapeutic Goals: Patients will explore the impact of different varieties of music on mood Patients will verbalize the thoughts they have when listening to different types of music Patients will identify music that is soothing to them as well as music that is energizing to them Patients will discuss how to use this knowledge to assist in maintaining wellness and recovery Patients will explore the use of music as a coping skill  Summary of Patient Progress:  At the beginning of group, patient expressed that she felt "I need to be home."  She was up and down, in and out of the room several times during the first few songs.  Because of disruptiveness on the part of other patients, group ended early.  Therapeutic Modalities: Solution Focused Brief Therapy Activity   Selmer Dominion, LCSW

## 2020-11-07 NOTE — Progress Notes (Signed)
Nursing 1:1 close observation note D:Pt observed sitting in dayroom. Pt in no apparent distress.  A: 1:1 close observation continues for safety  R: pt remains safe

## 2020-11-07 NOTE — Progress Notes (Signed)
Pt awake, visible in hall engaged with staff. Anxious and restless but is verbally redirectable. Went off unit for meals, returned without issues.  Support and encouragement offered. Close observation maintained as ordered without incident. Assigned MHT in attendance at all times.  Safety maintained on and off unit. Remains disorganized but is verbally redirectable.

## 2020-11-07 NOTE — Progress Notes (Signed)
The focus of this group is to help patients establish daily goals to achieve during treatment and discuss how the patient can incorporate goal setting into their daily lives to aide in recovery. The patient attended group.

## 2020-11-08 MED ORDER — CLOZAPINE 25 MG PO TABS
50.0000 mg | ORAL_TABLET | Freq: Every day | ORAL | Status: DC
  Administered 2020-11-08 – 2020-11-09 (×2): 50 mg via ORAL
  Filled 2020-11-08 (×3): qty 2

## 2020-11-08 MED ORDER — HALOPERIDOL 5 MG PO TABS
10.0000 mg | ORAL_TABLET | Freq: Two times a day (BID) | ORAL | Status: DC
  Administered 2020-11-08 – 2020-11-09 (×3): 10 mg via ORAL
  Filled 2020-11-08 (×6): qty 2

## 2020-11-08 MED ORDER — LORAZEPAM 1 MG PO TABS
ORAL_TABLET | ORAL | Status: AC
  Filled 2020-11-08: qty 1

## 2020-11-08 MED ORDER — LORAZEPAM 1 MG PO TABS
1.0000 mg | ORAL_TABLET | Freq: Once | ORAL | Status: AC
  Administered 2020-11-08: 1 mg via ORAL

## 2020-11-08 NOTE — Progress Notes (Signed)
Patient has become increasingly anxious and pacing, difficult to redirect. Order received from [place].A. to give 8 am dose of Haldol 20 mg and Cogentin 1 mg to 6 am. Order also received for 1 mg Ativan.

## 2020-11-08 NOTE — Progress Notes (Signed)
Recreation Therapy Notes  Date: 12.13.21 Time: 1005 Location: 500 Hall Dayroom   Group Topic: Communication, Team Building, Problem Solving  Goal Area(s) Addresses:  Patient will effectively work with peer towards shared goal.  Patient will identify skills used to make activity successful.  Patient will share challenges and verbalize solution-driven approaches used. Patient will identify how skills used during activity can be used to reach post d/c goals.   Intervention: STEM Activity   Activity: [employer]. Patients were provided the following materials: 2 drinking straws, 5 rubber bands, 5 paper clips, 2 index cards and 2 drinking cups. Using the provided materials patients were asked to build a launching mechanism to launch a ping pong ball across the room, approximately 10 feet. Patients were divided into teams of 3-5. Instructions required all materials be incorporated into the device, functionality of items left to the peer group's discretion.  Education: Education officer, community, Environmental health practitioner, Product/process development scientist, Dentist.   Education Outcome: Acknowledges education/In group clarification offered/Needs additional education.   Clinical Observations/Feedback: Pt did not attend group.    [name], LRT/CTRS         [name] A 11/08/2020 12:20 PM

## 2020-11-08 NOTE — Progress Notes (Signed)
   11/08/20 0611  Vital Signs  Pulse Rate 88  BP (!) 138/123  BP Location Right Arm  BP Method Automatic  Patient Position (if appropriate) Standing  Sleep  Number of Hours 4   D: Pt admits to some passive SI. Denies HI/AVH. Pt. Rated anxiety 5/10 and depression 3/10. Pt. Was out in open areas and attended group. A:  Patient took scheduled medicine.  Support and encouragement provided Routine safety checks conducted every 15 minutes. Patient  Informed to notify staff with any concerns.   R: Safety maintained.

## 2020-11-08 NOTE — BHH Group Notes (Signed)
[facility] LCSW Group Therapy  11/08/2020 3:44 PM   The focus of this group was to explore self compassion, awareness, and ways to practice self-compassion in daily life.   Type of Therapy:  Group Therapy  Participation Level:  Minimal  Participation Quality:  Drowsy and Sharing  Affect:  Appropriate  Cognitive:  Lacking  Insight:  Improving  Engagement in Therapy:  Improving  Modes of Intervention:  Activity, Education and Exploration  Summary of Progress/Problems: Patient attended and participated in group, however this patient fell asleep during the middle of group and did not wake up until the end.   Tajana Crotteau A Anahla Bevis 11/08/2020, 3:44 PM

## 2020-11-08 NOTE — Progress Notes (Signed)
[facility] MD Progress Note  11/08/2020 11:56 AM Paula Dickson  MRN:  [id number] Subjective: Patient is a 54 year old female with a reported past psychiatric history significant for schizoaffective disorder; bipolar type who originally presented to the [facility] emergency department on 10/25/2020.  The patient's sisters had brought her to the emergency department because of worsening psychotic symptoms.  Objective: Patient is seen and examined.  Patient is a 54 year old female with the above-stated past psychiatric history who is seen in follow-up.  Patient is familiar to me from having taken care of her on my last rotation.  Since the last time I saw the patient she had been titrated back on Haldol as well as Zyprexa.  She does not appear to be much better.  She still very disorganized, intrusive, pressured speech.  Her blood pressure remains elevated going from 160/100-130 8/123.  Her pulse is stable at 78-88.  Her sleep is still poor.  Despite the combination therapy she only got 4 hours of sleep last night.  Review of her laboratories revealed labs from 11/05/2020.  Her electrolytes were all essentially normal.  Liver function enzymes were normal.  CBC was normal.  Differential was normal.  Her Tegretol level was 7.5.  Principal Problem: Schizophrenia ([place]) Diagnosis: Principal Problem:   Schizophrenia ([place]) Active Problems:   Psychogenic polydipsia   Bipolar affective disorder, current episode mixed ([place])   AMS (altered mental status)  Total Time spent with patient: 30 minutes  Past Psychiatric History: See admission H&P  Past Medical History:  Past Medical History:  Diagnosis Date  . Bipolar affective disorder ([place])   . History of arthritis   . History of chicken pox   . History of depression   . History of genital warts   . history of heart murmur   . History of high blood pressure   . History of thyroid disease   . History of UTI   . Hypertension   . Low  TSH level 07/13/2017  . Schizophrenia [facility])     Past Surgical History:  Procedure Laterality Date  . ABLATION ON ENDOMETRIOSIS    . CYST REMOVAL NECK     around 11 years ago /benign  . MULTIPLE TOOTH EXTRACTIONS     Family History:  Family History  Problem Relation Age of Onset  . Arthritis Father   . Hyperlipidemia Father   . High blood pressure Father   . Diabetes Sister   . Diabetes Mother   . Diabetes Brother   . Mental illness Brother   . Alcohol abuse Paternal Uncle   . Alcohol abuse Paternal Grandfather   . Breast cancer Maternal Aunt   . Breast cancer Paternal Aunt   . High blood pressure Sister   . Mental illness Other        runs in family   Family Psychiatric  History: See admission H&P Social History:  Social History   Substance and Sexual Activity  Alcohol Use No     Social History   Substance and Sexual Activity  Drug Use No    Social History   Socioeconomic History  . Marital status: Single    Spouse name: Not on file  . Number of children: Not on file  . Years of education: Not on file  . Highest education level: Patient refused  Occupational History  . Not on file  Tobacco Use  . Smoking status: Never Smoker  . Smokeless tobacco: Never Used  Vaping Use  .  Vaping Use: Never used  Substance and Sexual Activity  . Alcohol use: No  . Drug use: No  . Sexual activity: Not Currently  Other Topics Concern  . Not on file  Social History Narrative  . Not on file   Social Determinants of Health   Financial Resource Strain: Not on file  Food Insecurity: Not on file  Transportation Needs: Not on file  Physical Activity: Not on file  Stress: Not on file  Social Connections: Not on file   Additional Social History:                         Sleep: Fair  Appetite:  Fair  Current Medications: Current Facility-Administered Medications  Medication Dose Route Frequency Provider Last Rate Last Admin  . acetaminophen (TYLENOL)  tablet 650 mg  650 mg Oral Q6H PRN Sharma Covert, MD   650 mg at 10/31/20 2157  . alum & mag hydroxide-simeth (MAALOX/MYLANTA) 200-200-20 MG/5ML suspension 30 mL  30 mL Oral Q4H PRN Sharma Covert, MD      . amLODipine (NORVASC) tablet 10 mg  10 mg Oral Daily Sharma Covert, MD   10 mg at 11/08/20 0347  . benztropine (COGENTIN) tablet 1 mg  1 mg Oral BID Ival Bible, MD   1 mg at 11/08/20 4259  . carbamazepine (TEGRETOL) chewable tablet 150 mg  150 mg Oral TID Ival Bible, MD   150 mg at 11/08/20 0915  . carvedilol (COREG) tablet 25 mg  25 mg Oral BID WC Sharma Covert, MD   25 mg at 11/08/20 [phone]  . cloZAPine (CLOZARIL) tablet 50 mg  50 mg Oral QHS Sharma Covert, MD      . haloperidol (HALDOL) tablet 10 mg  10 mg Oral Q6H PRN Sharma Covert, MD   10 mg at 11/06/20 2159  . haloperidol (HALDOL) tablet 10 mg  10 mg Oral BID Sharma Covert, MD      . hydrOXYzine (ATARAX/VISTARIL) tablet 25 mg  25 mg Oral TID PRN Sharma Covert, MD   25 mg at 11/06/20 2053  . magnesium hydroxide (MILK OF MAGNESIA) suspension 30 mL  30 mL Oral Daily PRN Sharma Covert, MD      . temazepam (RESTORIL) capsule 30 mg  30 mg Oral QHS Ethelene Hal, NP   30 mg at 11/07/20 2044    Lab Results: No results found for this or any previous visit (from the past 48 hour(s)).  Blood Alcohol level:  Lab Results  Component Value Date   ETH <10 10/25/2020   ETH <10 75/64/3329    Metabolic Disorder Labs: Lab Results  Component Value Date   HGBA1C 6.0 (H) 10/26/2020   MPG 125.5 10/26/2020   MPG 111.15 02/08/2020   Lab Results  Component Value Date   PROLACTIN 11.0 03/14/2019   Lab Results  Component Value Date   CHOL 173 10/26/2020   TRIG 158 (H) 10/26/2020   HDL 46 10/26/2020   CHOLHDL 3.8 10/26/2020   VLDL 32 10/26/2020   LDLCALC 95 10/26/2020   LDLCALC 76 02/08/2020    Physical Findings: AIMS: Facial and Oral Movements Muscles of Facial  Expression: None, normal Lips and Perioral Area: None, normal Jaw: None, normal Tongue: None, normal,Extremity Movements Upper (arms, wrists, hands, fingers): None, normal Lower (legs, knees, ankles, toes): None, normal, Trunk Movements Neck, shoulders, hips: None, normal, Overall Severity Severity of abnormal movements (highest  score from questions above): None, normal Incapacitation due to abnormal movements: None, normal Patient's awareness of abnormal movements (rate only patient's report): No Awareness, Dental Status Current problems with teeth and/or dentures?: No Does patient usually wear dentures?: No  CIWA:    COWS:     Musculoskeletal: Strength & Muscle Tone: within normal limits Gait & Station: shuffle Patient leans: N/A  Psychiatric Specialty Exam: Physical Exam Vitals and nursing note reviewed.     Review of Systems  Blood pressure (!) 138/123, pulse 88, temperature 97.7 F (36.5 C), temperature source Oral, resp. rate 20, height 5\' 1"  (1.549 m), weight 67.1 kg, SpO2 99 %.Body mass index is 27.96 kg/m.  General Appearance: Disheveled  Eye Contact:  Minimal  Speech:  Pressured  Volume:  Increased  Mood:  Anxious and Dysphoric  Affect:  Labile  Thought Process:  Disorganized and Descriptions of Associations: Loose  Orientation:  Negative  Thought Content:  Delusions, Paranoid Ideation and Rumination  Suicidal Thoughts:  No  Homicidal Thoughts:  No  Memory:  Immediate;   Poor Recent;   Poor Remote;   Poor  Judgement:  Impaired  Insight:  Lacking  Psychomotor Activity:  Increased  Concentration:  Concentration: Poor and Attention Span: Poor  Recall:  Poor  Fund of Knowledge:  Poor  Language:  Fair  Akathisia:  Negative  Handed:  Right  AIMS (if indicated):     Assets:  Desire for Improvement Resilience  ADL's:  Impaired  Cognition:  WNL  Sleep:  Number of Hours: 4     Treatment Plan Summary: Daily contact with patient to assess and evaluate  symptoms and progress in treatment, Medication management and Plan : Patient is seen and examined.  Patient is a 54 year old female with the above-stated past psychiatric history who is seen in follow-up.   Diagnosis: 1. Schizoaffective disorder; bipolar type 2. Hypertension 3. Hyperthyroidism 4. Diabetes mellitus type 2 5.  Multiple thyroid nodules 6.  Multinodular goiter  Pertinent findings on examination today: 1.  Patient remains disorganized, agitated and at times manic. 2.  Poor sleep 3.  Combination of haloperidol with olanzapine has not been effective in stabilizing her. 4.  Continues on Tegretol. 5.  Hypertension instability remains.  Plan: 1.  Continue amlodipine 10 mg p.o. daily for hypertension. 2.  Continue Cogentin 1 mg p.o. twice daily for side effects of medications. 3.  Continue Tegretol 150 mg p.o. 3 times daily for mood stability. 4.  Continue carvedilol 25 mg p.o. twice daily for hypertension. 5.  Start Clozaril 50 mg p.o. nightly and titrate for psychosis. 6.  Stop Zyprexa. 7.  Continue haloperidol 10 mg p.o. twice daily and every 6 hours as needed for psychosis and agitation. 8.  Continue hydroxyzine 25 mg p.o. 3 times daily as needed anxiety. 9.  Continue temazepam 30 mg p.o. nightly for insomnia. 10.  Disposition planning-in progress.   Sharma Covert, MD 11/08/2020, 11:56 AM

## 2020-11-08 NOTE — Progress Notes (Signed)
Was notified by Aurora Mask, RN that patient was experiencing increased anxiety, restlessness, and pacing her room continuously. Will give 1-time dose of Ativan 1 mg PO, as well as move 8:00 AM doses of 1 mg Cogentin PO and 20 mg Haldol PO to 6:00 AM for this. Spoke with Aurora Mask, RN, regarding this and she expressed verbal understanding and agreement.

## 2020-11-08 NOTE — Progress Notes (Signed)
Close Observation D/C     11/08/20 0915  Psych Admission Type (Psych Patients Only)  Admission Status Voluntary  Psychosocial Assessment  Patient Complaints None  Eye Contact Intense  Facial Expression Anxious  Affect Appropriate to circumstance  Speech Pressured;Tangential;Rhyming;Slurred  Interaction Assertive;Childlike  Motor Activity Fidgety;Restless  Appearance/Hygiene Unremarkable  Behavior Characteristics Anxious  Mood Anxious  Thought Process  Coherency Disorganized;Flight of ideas  Content Obsessions;Paranoia  Delusions Paranoid;Religious  Perception Derealization  Hallucination None reported or observed  Judgment Limited  Confusion Mild  Danger to Self  Current suicidal ideation? Denies  Danger to Others  Danger to Others None reported or observed

## 2020-11-08 NOTE — Progress Notes (Signed)
Adult Psychoeducational Group Note  Date:  11/08/2020 Time:  10:23 PM  Group Topic/Focus:  Wrap-Up Group:   The focus of this group is to help patients review their daily goal of treatment and discuss progress on daily workbooks.  Participation Level:  Did Not Attend  Participation Quality:  Did Not Attend  Affect:  Did Not Attend  Cognitive:  Did Not Attend  Insight: None  Engagement in Group:  Did Not Attend  Modes of Intervention:  Did Not Attend  Additional Comments:  Pt did not attend evening wrap group tonight.  [name] 11/08/2020, 10:23 PM

## 2020-11-08 NOTE — [facility] Group Notes (Signed)
The focus of this group is to help patients establish daily goals to achieve during treatment and discuss how the patient can incorporate goal setting into their daily lives to aide in recovery.     Pt was very vocal in group

## 2020-11-09 MED ORDER — CARVEDILOL 12.5 MG PO TABS
37.5000 mg | ORAL_TABLET | Freq: Two times a day (BID) | ORAL | Status: DC
Start: 2020-11-09 — End: 2020-11-12
  Administered 2020-11-09 – 2020-11-12 (×6): 37.5 mg via ORAL
  Filled 2020-11-09 (×10): qty 3

## 2020-11-09 NOTE — Progress Notes (Incomplete)
Pt has been on the unit acting appropriately , pt has needed redirection at times. Pt has followed directions and has been appropriate this morning.

## 2020-11-09 NOTE — Progress Notes (Signed)
[facility] 1:1 Observation Documentation  For the first (8) hours following discontinuation of 1:1 precautions, a progress note entry by nursing staff should be documented at least every 2 hours, reflecting the patient's behavior, condition, mood, and conversation.  Use the progress notes for additional entries.  Time 1:1 discontinued:  2008  Patient's Behavior:  calm  Patient's Condition:  sleeep  Patient's Conversation:  N/A  Providence Crosby 11/09/2020, 3:16 AM

## 2020-11-09 NOTE — Plan of Care (Signed)
  Problem: Education: Goal: Knowledge of [facility] Education information/materials will improve Outcome: Progressing Goal: Emotional status will improve Outcome: Progressing Goal: Mental status will improve Outcome: Progressing Goal: Verbalization of understanding the information provided will improve Outcome: Progressing   

## 2020-11-09 NOTE — Progress Notes (Signed)
[facility] MD Progress Note  11/09/2020 12:08 PM Paula Dickson  MRN:  [id number] Subjective:  Patient is a 54 year old female with a reported past psychiatric history significant for schizoaffective disorder; bipolar type who originally presented to the [facility] emergency department on 10/25/2020.  The patient's sisters had brought her to the emergency department because of worsening psychotic symptoms.  Objective: Patient is seen and examined.  Patient is a 54 year old female with the above-stated past psychiatric history is seen in follow-up.  She actually is slightly better today.  She addresses me as "Mr. Dr. Mallie Darting".  Staff stated she also referred to me earlier today as "Mr. Dr. Marcene Brawn".  Her speech rate is still increased, but is a little bit clear today.  She slept better last night with her current combination of medications.  She still intrusive, agitated, and goes into conversations that are pressured and tangential.  She seems to be tolerating the Clozaril without difficulty.  She stated this morning that she had been on it previously.  She is unhappy about the fact that she has to remain in the hospital.  Her blood pressure remains elevated at 153/93 and 145/96.  Pulse was between 79 and 87.  She is afebrile.  She did sleep better last night with 7.75 hours.  No new laboratories.  Principal Problem: Schizophrenia ([place]) Diagnosis: Principal Problem:   Schizophrenia ([place]) Active Problems:   Psychogenic polydipsia   Bipolar affective disorder, current episode mixed ([place])   AMS (altered mental status)  Total Time spent with patient: 20 minutes  Past Psychiatric History: See admission H&P  Past Medical History:  Past Medical History:  Diagnosis Date  . Bipolar affective disorder ([place])   . History of arthritis   . History of chicken pox   . History of depression   . History of genital warts   . history of heart murmur   . History of high blood pressure   .  History of thyroid disease   . History of UTI   . Hypertension   . Low TSH level 07/13/2017  . Schizophrenia [facility])     Past Surgical History:  Procedure Laterality Date  . ABLATION ON ENDOMETRIOSIS    . CYST REMOVAL NECK     around 11 years ago /benign  . MULTIPLE TOOTH EXTRACTIONS     Family History:  Family History  Problem Relation Age of Onset  . Arthritis Father   . Hyperlipidemia Father   . High blood pressure Father   . Diabetes Sister   . Diabetes Mother   . Diabetes Brother   . Mental illness Brother   . Alcohol abuse Paternal Uncle   . Alcohol abuse Paternal Grandfather   . Breast cancer Maternal Aunt   . Breast cancer Paternal Aunt   . High blood pressure Sister   . Mental illness Other        runs in family   Family Psychiatric  History: See admission H&P Social History:  Social History   Substance and Sexual Activity  Alcohol Use No     Social History   Substance and Sexual Activity  Drug Use No    Social History   Socioeconomic History  . Marital status: Single    Spouse name: Not on file  . Number of children: Not on file  . Years of education: Not on file  . Highest education level: Patient refused  Occupational History  . Not on file  Tobacco Use  .  Smoking status: Never Smoker  . Smokeless tobacco: Never Used  Vaping Use  . Vaping Use: Never used  Substance and Sexual Activity  . Alcohol use: No  . Drug use: No  . Sexual activity: Not Currently  Other Topics Concern  . Not on file  Social History Narrative  . Not on file   Social Determinants of Health   Financial Resource Strain: Not on file  Food Insecurity: Not on file  Transportation Needs: Not on file  Physical Activity: Not on file  Stress: Not on file  Social Connections: Not on file   Additional Social History:                         Sleep: Fair  Appetite:  Fair  Current Medications: Current Facility-Administered Medications  Medication Dose Route  Frequency Provider Last Rate Last Admin  . acetaminophen (TYLENOL) tablet 650 mg  650 mg Oral Q6H PRN Sharma Covert, MD   650 mg at 10/31/20 2157  . alum & mag hydroxide-simeth (MAALOX/MYLANTA) 200-200-20 MG/5ML suspension 30 mL  30 mL Oral Q4H PRN Sharma Covert, MD      . amLODipine (NORVASC) tablet 10 mg  10 mg Oral Daily Sharma Covert, MD   10 mg at 11/09/20 0739  . benztropine (COGENTIN) tablet 1 mg  1 mg Oral BID Ival Bible, MD   1 mg at 11/09/20 0739  . carbamazepine (TEGRETOL) chewable tablet 150 mg  150 mg Oral TID Ival Bible, MD   150 mg at 11/09/20 1143  . carvedilol (COREG) tablet 25 mg  25 mg Oral BID WC Sharma Covert, MD   25 mg at 11/09/20 0739  . cloZAPine (CLOZARIL) tablet 50 mg  50 mg Oral QHS Sharma Covert, MD   50 mg at 11/08/20 2054  . haloperidol (HALDOL) tablet 10 mg  10 mg Oral Q6H PRN Sharma Covert, MD   10 mg at 11/06/20 2159  . haloperidol (HALDOL) tablet 10 mg  10 mg Oral BID Sharma Covert, MD   10 mg at 11/09/20 0739  . hydrOXYzine (ATARAX/VISTARIL) tablet 25 mg  25 mg Oral TID PRN Sharma Covert, MD   25 mg at 11/08/20 2054  . magnesium hydroxide (MILK OF MAGNESIA) suspension 30 mL  30 mL Oral Daily PRN Sharma Covert, MD      . temazepam (RESTORIL) capsule 30 mg  30 mg Oral QHS Ethelene Hal, NP   30 mg at 11/08/20 2054    Lab Results: No results found for this or any previous visit (from the past 48 hour(s)).  Blood Alcohol level:  Lab Results  Component Value Date   ETH <10 10/25/2020   ETH <10 84/13/2440    Metabolic Disorder Labs: Lab Results  Component Value Date   HGBA1C 6.0 (H) 10/26/2020   MPG 125.5 10/26/2020   MPG 111.15 02/08/2020   Lab Results  Component Value Date   PROLACTIN 11.0 03/14/2019   Lab Results  Component Value Date   CHOL 173 10/26/2020   TRIG 158 (H) 10/26/2020   HDL 46 10/26/2020   CHOLHDL 3.8 10/26/2020   VLDL 32 10/26/2020   LDLCALC 95 10/26/2020    LDLCALC 76 02/08/2020    Physical Findings: AIMS: Facial and Oral Movements Muscles of Facial Expression: None, normal Lips and Perioral Area: None, normal Jaw: None, normal Tongue: None, normal,Extremity Movements Upper (arms, wrists, hands, fingers): None, normal  Lower (legs, knees, ankles, toes): None, normal, Trunk Movements Neck, shoulders, hips: None, normal, Overall Severity Severity of abnormal movements (highest score from questions above): None, normal Incapacitation due to abnormal movements: None, normal Patient's awareness of abnormal movements (rate only patient's report): No Awareness, Dental Status Current problems with teeth and/or dentures?: No Does patient usually wear dentures?: No  CIWA:    COWS:     Musculoskeletal: Strength & Muscle Tone: within normal limits Gait & Station: shuffle Patient leans: N/A  Psychiatric Specialty Exam: Physical Exam Vitals and nursing note reviewed.  HENT:     Head: Normocephalic and atraumatic.  Pulmonary:     Effort: Pulmonary effort is normal.  Neurological:     General: No focal deficit present.     Mental Status: She is alert.     Review of Systems  Blood pressure (!) 145/96, pulse 87, temperature 98.1 F (36.7 C), temperature source Oral, resp. rate 20, height 5\' 1"  (1.549 m), weight 67.1 kg, SpO2 99 %.Body mass index is 27.96 kg/m.  General Appearance: Disheveled  Eye Contact:  Fair  Speech:  Pressured  Volume:  Normal  Mood:  Anxious, Dysphoric and Irritable  Affect:  Labile  Thought Process:  Disorganized and Descriptions of Associations: Loose  Orientation:  Negative  Thought Content:  Delusions, Paranoid Ideation, Rumination and Tangential  Suicidal Thoughts:  No  Homicidal Thoughts:  No  Memory:  Immediate;   Poor Recent;   Poor Remote;   Poor  Judgement:  Impaired  Insight:  Lacking  Psychomotor Activity:  Increased  Concentration:  Concentration: Poor and Attention Span: Poor  Recall:  Poor   Fund of Knowledge:  Fair  Language:  Fair  Akathisia:  Negative  Handed:  Right  AIMS (if indicated):     Assets:  Desire for Improvement Housing Resilience Social Support  ADL's:  Impaired  Cognition:  WNL  Sleep:  Number of Hours: 7.75     Treatment Plan Summary: Daily contact with patient to assess and evaluate symptoms and progress in treatment, Medication management and Plan : Patient is seen and examined.  Patient is a 54 year old female with the above-stated past psychiatric history who is seen in follow-up.   Diagnosis: 1. Schizoaffective disorder; bipolar type 2. Hypertension 3. Hyperthyroidism 4. Diabetes mellitus type 2 5. Multiple thyroid nodules 6. Multinodular goiter  Pertinent findings on examination today: 1.  Patient remains disorganized, agitated and at times manic.  Speech rate is somewhat improved and more coherent today. 2.  Sleep is improved slightly with addition of clozapine. 3.  Hypertension continues.  Plan: 1.  Continue amlodipine 10 mg p.o. daily for hypertension. 2.  Continue Cogentin 1 mg p.o. twice daily for side effects of medication. 3.  Continue Tegretol 150 mg p.o. 3 times daily for mood stability. 4.  Increase carvedilol to 37.5 mg p.o. twice daily for hypertension. 6.  Continue Clozaril 50 mg p.o. nightly and titrate.  This is for psychosis. 7.  Continue haloperidol 10 mg p.o. every 6 hours as needed agitation. 8.  Decrease Haldol to 5 mg p.o. daily and 10 mg p.o. nightly for psychosis. 9.  Continue temazepam 30 mg p.o. nightly for insomnia. 10.  Disposition planning-in progress.   Sharma Covert, MD 11/09/2020, 12:08 PM

## 2020-11-09 NOTE — Progress Notes (Signed)
[facility] 1:1 Observation Documentation  For the first (8) hours following discontinuation of 1:1 precautions, a progress note entry by nursing staff should be documented at least every 2 hours, reflecting the patient's behavior, condition, mood, and conversation.  Use the progress notes for additional entries.  Time 1:1 discontinued:  2008  Patient's Behavior:  calm  Patient's Condition:  sleep  Patient's Conversation:  N/A  Providence Crosby 11/09/2020, 2:31 AM

## 2020-11-09 NOTE — [facility] Group Notes (Signed)
Adult Psychoeducational Group Note  Date:  11/09/2020 Time:  10:45 AM  Group Topic/Focus:  Goals Group:   The focus of this group is to help patients establish daily goals to achieve during treatment and discuss how the patient can incorporate goal setting into their daily lives to aide in recovery.  Participation Level:  Active  Participation Quality:  Intrusive  Affect:  Anxious  Cognitive:  Disorganized  Insight: Improving  Engagement in Group:  Engaged  Modes of Intervention:  Discussion  Additional Comments:  Pt has a goal of going home and paying her bills on time. Pt talked about many things they would like to do but because pt is in the hospital the goal is discharge.   Paula Dickson 11/09/2020, 10:45 AM

## 2020-11-09 NOTE — Progress Notes (Signed)
Progress note    11/09/20 0740  Psych Admission Type (Psych Patients Only)  Admission Status Voluntary  Psychosocial Assessment  Patient Complaints Anxiety;[place];Intense  Facial Expression Animated;Anxious;Pensive  Affect Anxious;Preoccupied  Speech Pressured;Tangential;Rhyming  Interaction Assertive;Attention-seeking  Motor Activity Fidgety;Restless  Appearance/Hygiene Poor hygiene  Behavior Characteristics Cooperative;Appropriate to situation;Anxious;Fidgety;Pacing  Mood Anxious;Suspicious;Preoccupied;Pleasant  Thought Process  Coherency Disorganized;Flight of ideas  Content Obsessions;Paranoia  Delusions Paranoid  Perception Derealization  Hallucination None reported or observed  Judgment Limited  Confusion Mild  Danger to Self  Current suicidal ideation? Denies  Danger to Others  Danger to Others None reported or observed

## 2020-11-09 NOTE — Progress Notes (Signed)
[place] [facility] 1:1 Observation Documentation  For the first (8) hours following discontinuation of 1:1 precautions, a progress note entry by nursing staff should be documented at least every 2 hours, reflecting the patient's behavior, condition, mood, and conversation.  Use the progress notes for additional entries.  Time 1:1 discontinued:  2008  Patient's Behavior:  calm  Patient's Condition:  sleep  Patient's Conversation:  N/A  Providence Crosby 11/09/2020, 6:28 AM

## 2020-11-09 NOTE — Progress Notes (Signed)
[place] [facility] 1:1 Observation Documentation  For the first (8) hours following discontinuation of 1:1 precautions, a progress note entry by nursing staff should be documented at least every 2 hours, reflecting the patient's behavior, condition, mood, and conversation.  Use the progress notes for additional entries.  Time 1:1 discontinued:  2008  Patient's Behavior:  calm  Patient's Condition:  Awake in bed  Patient's Conversation:  Pt wanted to know what time it was , then pt started to go back to sleep  Vonzella Nipple A 11/09/2020, 2:32 AM

## 2020-11-10 LAB — CBC WITH DIFFERENTIAL/PLATELET
Abs Immature Granulocytes: 0 10*3/uL (ref 0.00–0.07)
Basophils Absolute: 0.1 10*3/uL (ref 0.0–0.1)
Basophils Relative: 1 %
Eosinophils Absolute: 0.4 10*3/uL (ref 0.0–0.5)
Eosinophils Relative: 7 %
HCT: 39.9 % (ref 36.0–46.0)
Hemoglobin: 13 g/dL (ref 12.0–15.0)
Immature Granulocytes: 0 %
Lymphocytes Relative: 44 %
Lymphs Abs: 2.7 10*3/uL (ref 0.7–4.0)
MCH: 28.2 pg (ref 26.0–34.0)
MCHC: 32.6 g/dL (ref 30.0–36.0)
MCV: 86.6 fL (ref 80.0–100.0)
Monocytes Absolute: 0.6 10*3/uL (ref 0.1–1.0)
Monocytes Relative: 9 %
Neutro Abs: 2.4 10*3/uL (ref 1.7–7.7)
Neutrophils Relative %: 39 %
Platelets: 213 10*3/uL (ref 150–400)
RBC: 4.61 MIL/uL (ref 3.87–5.11)
RDW: 12.3 % (ref 11.5–15.5)
WBC: 6.1 10*3/uL (ref 4.0–10.5)
nRBC: 0 % (ref 0.0–0.2)

## 2020-11-10 MED ORDER — CLOZAPINE 100 MG PO TABS
100.0000 mg | ORAL_TABLET | Freq: Every day | ORAL | Status: DC
  Administered 2020-11-10 – 2020-11-11 (×2): 100 mg via ORAL
  Filled 2020-11-10: qty 7
  Filled 2020-11-10 (×3): qty 1

## 2020-11-10 MED ORDER — CLOZAPINE 25 MG PO TABS
25.0000 mg | ORAL_TABLET | ORAL | Status: AC
  Administered 2020-11-10: 25 mg via ORAL
  Filled 2020-11-10 (×2): qty 1

## 2020-11-10 MED ORDER — HALOPERIDOL 5 MG PO TABS
10.0000 mg | ORAL_TABLET | Freq: Every day | ORAL | Status: DC
  Administered 2020-11-10: 10 mg via ORAL
  Filled 2020-11-10 (×2): qty 2

## 2020-11-10 NOTE — Progress Notes (Addendum)
   11/10/20 2154  Psych Admission Type (Psych Patients Only)  Admission Status Voluntary  Psychosocial Assessment  Patient Complaints None  Eye Contact Fair;Intense  Facial Expression Animated;Anxious;Pensive  Affect Anxious;Preoccupied  Speech Pressured;Tangential;Rhyming  Interaction Assertive;Attention-seeking  Motor Activity Fidgety;Restless  Appearance/Hygiene Disheveled  Behavior Characteristics Cooperative  Mood Pleasant  Thought Process  Coherency Disorganized;Flight of ideas  Content Obsessions;Paranoia  Delusions Paranoid  Perception Derealization  Hallucination None reported or observed  Judgment Limited  Confusion Mild  Danger to Self  Current suicidal ideation? Denies  Danger to Others  Danger to Others None reported or observed   Pt took meds without incident. Pt denies SI, HI, AVH. Pt endorses transient pain in her left eye. Pt still flight of ideas but more calm and redirectable.

## 2020-11-10 NOTE — Tx Team (Signed)
Interdisciplinary Treatment and Diagnostic Plan Update  11/10/2020 Time of Session: 9:15am Paula Dickson MRN: [id number]  Principal Diagnosis: Schizophrenia [facility])  Secondary Diagnoses: Principal Problem:   Schizophrenia ([place]) Active Problems:   Psychogenic polydipsia   Bipolar affective disorder, current episode mixed ([place])   AMS (altered mental status)   Current Medications:  Current Facility-Administered Medications  Medication Dose Route Frequency Provider Last Rate Last Admin  . acetaminophen (TYLENOL) tablet 650 mg  650 mg Oral Q6H PRN Sharma Covert, MD   650 mg at 10/31/20 2157  . alum & mag hydroxide-simeth (MAALOX/MYLANTA) 200-200-20 MG/5ML suspension 30 mL  30 mL Oral Q4H PRN Sharma Covert, MD      . amLODipine (NORVASC) tablet 10 mg  10 mg Oral Daily Sharma Covert, MD   10 mg at 11/10/20 0748  . benztropine (COGENTIN) tablet 1 mg  1 mg Oral BID Ival Bible, MD   1 mg at 11/10/20 0750  . carbamazepine (TEGRETOL) chewable tablet 150 mg  150 mg Oral TID Ival Bible, MD   150 mg at 11/10/20 0748  . carvedilol (COREG) tablet 37.5 mg  37.5 mg Oral BID WC Sharma Covert, MD   37.5 mg at 11/10/20 0748  . cloZAPine (CLOZARIL) tablet 100 mg  100 mg Oral QHS Sharma Covert, MD      . cloZAPine (CLOZARIL) tablet 25 mg  25 mg Oral NOW Sharma Covert, MD      . haloperidol (HALDOL) tablet 10 mg  10 mg Oral Q6H PRN Sharma Covert, MD   10 mg at 11/06/20 2159  . haloperidol (HALDOL) tablet 10 mg  10 mg Oral QHS Sharma Covert, MD      . hydrOXYzine (ATARAX/VISTARIL) tablet 25 mg  25 mg Oral TID PRN Sharma Covert, MD   25 mg at 11/09/20 2017  . magnesium hydroxide (MILK OF MAGNESIA) suspension 30 mL  30 mL Oral Daily PRN Sharma Covert, MD      . temazepam (RESTORIL) capsule 30 mg  30 mg Oral QHS Ethelene Hal, NP   30 mg at 11/09/20 2010   PTA Medications: Medications Prior to Admission  Medication Sig Dispense  Refill Last Dose  . amLODipine (NORVASC) 10 MG tablet Take 10 mg by mouth daily.   Past Week at Unknown time  . benztropine (COGENTIN) 1 MG tablet Take 1 tablet (1 mg total) by mouth 2 (two) times daily. For prevention of drug induced tremors 60 tablet 0   . carbamazepine (TEGRETOL) 100 MG chewable tablet Take 1 tablet (100 mg) by mouth in the morning & 2 tablets at bedtime: For mood stabilization 60 tablet 0   . carvedilol (COREG) 25 MG tablet Take 1 tablet (25 mg total) by mouth 2 (two) times daily with a meal. 60 tablet 2   . haloperidol (HALDOL) 10 MG tablet Take 1 tablet (10 mg total) by mouth at bedtime. For mood control 30 tablet 0   . haloperidol decanoate (HALDOL DECANOATE) 100 MG/ML injection Inject 1.5 mLs (150 mg total) into the muscle every 30 (thirty) days. (Due on 02/27/20): For mood control (Patient not taking: Reported on 10/26/2020) 1 mL 0 Not Taking  . traZODone (DESYREL) 50 MG tablet Take 1 tablet by mouth at bedtime as needed.       Patient Stressors: Financial difficulties Health problems Medication change or noncompliance  Patient Strengths: Active sense of humor  Treatment Modalities: Medication Management, Group therapy, Case  management,  1 to 1 session with clinician, Psychoeducation, Recreational therapy.   Physician Treatment Plan for Primary Diagnosis: Schizophrenia ([place]) Long Term Goal(s): Improvement in symptoms so as ready for discharge Improvement in symptoms so as ready for discharge   Short Term Goals: Ability to identify changes in lifestyle to reduce recurrence of condition will improve Ability to verbalize feelings will improve Ability to demonstrate self-control will improve Ability to identify and develop effective coping behaviors will improve Ability to maintain clinical measurements within normal limits will improve Compliance with prescribed medications will improve Ability to identify changes in lifestyle to reduce recurrence of condition  will improve Ability to verbalize feelings will improve Ability to demonstrate self-control will improve Ability to identify and develop effective coping behaviors will improve Ability to maintain clinical measurements within normal limits will improve Compliance with prescribed medications will improve  Medication Management: Evaluate patient's response, side effects, and tolerance of medication regimen.  Therapeutic Interventions: 1 to 1 sessions, Unit Group sessions and Medication administration.  Evaluation of Outcomes: Progressing  Physician Treatment Plan for Secondary Diagnosis: Principal Problem:   Schizophrenia ([place]) Active Problems:   Psychogenic polydipsia   Bipolar affective disorder, current episode mixed ([place])   AMS (altered mental status)  Long Term Goal(s): Improvement in symptoms so as ready for discharge Improvement in symptoms so as ready for discharge   Short Term Goals: Ability to identify changes in lifestyle to reduce recurrence of condition will improve Ability to verbalize feelings will improve Ability to demonstrate self-control will improve Ability to identify and develop effective coping behaviors will improve Ability to maintain clinical measurements within normal limits will improve Compliance with prescribed medications will improve Ability to identify changes in lifestyle to reduce recurrence of condition will improve Ability to verbalize feelings will improve Ability to demonstrate self-control will improve Ability to identify and develop effective coping behaviors will improve Ability to maintain clinical measurements within normal limits will improve Compliance with prescribed medications will improve     Medication Management: Evaluate patient's response, side effects, and tolerance of medication regimen.  Therapeutic Interventions: 1 to 1 sessions, Unit Group sessions and Medication administration.  Evaluation of Outcomes:  Progressing   RN Treatment Plan for Primary Diagnosis: Schizophrenia ([place]) Long Term Goal(s): Knowledge of disease and therapeutic regimen to maintain health will improve  Short Term Goals: Ability to remain free from injury will improve, Ability to verbalize frustration and anger appropriately will improve, Ability to demonstrate self-control, Ability to participate in decision making will improve, Ability to verbalize feelings will improve, Ability to identify and develop effective coping behaviors will improve and Compliance with prescribed medications will improve  Medication Management: RN will administer medications as ordered by provider, will assess and evaluate patient's response and provide education to patient for prescribed medication. RN will report any adverse and/or side effects to prescribing provider.  Therapeutic Interventions: 1 on 1 counseling sessions, Psychoeducation, Medication administration, Evaluate responses to treatment, Monitor vital signs and CBGs as ordered, Perform/monitor CIWA, COWS, AIMS and Fall Risk screenings as ordered, Perform wound care treatments as ordered.  Evaluation of Outcomes: Progressing   LCSW Treatment Plan for Primary Diagnosis: Schizophrenia ([place]) Long Term Goal(s): Safe transition to appropriate next level of care at discharge, Engage patient in therapeutic group addressing interpersonal concerns.  Short Term Goals: Engage patient in aftercare planning with referrals and resources, Increase social support, Increase ability to appropriately verbalize feelings, Increase emotional regulation, Identify triggers associated with mental health/substance abuse issues and Increase  skills for wellness and recovery  Therapeutic Interventions: Assess for all discharge needs, 1 to 1 time with Social worker, Explore available resources and support systems, Assess for adequacy in community support network, Educate family and significant other(s) on suicide  prevention, Complete Psychosocial Assessment, Interpersonal group therapy.  Evaluation of Outcomes: Progressing   Progress in Treatment: Attending groups: Yes. and No. Participating in groups: Yes. and No. Taking medication as prescribed: Yes. Toleration medication: Yes. Family/Significant other contact made: Yes, individual(s) contacted:  sister Patient understands diagnosis: Yes. Discussing patient identified problems/goals with staff: Yes. Medical problems stabilized or resolved: Yes. Denies suicidal/homicidal ideation: Yes. Issues/concerns per patient self-inventory: No.   New problem(s) identified: No, Describe:  none  New Short Term/Long Term Goal(s): medication stabilization, elimination of SI thoughts, development of comprehensive mental wellness plan.   Patient Goals:  "To move forward"  Discharge Plan or Barriers: Patient is followed by [facility] for medication management. Patient is to return home at discharge.    Reason for Continuation of Hospitalization: Delusions  Medication stabilization  Estimated Length of Stay: 3-5 days  Attendees: Patient:  10/27/2020   Physician:  10/27/2020   Nursing:  10/27/2020   RN Care Manager: 10/27/2020  Social Worker: Darletta Moll, LCSW 10/27/2020   Recreational Therapist:  10/27/2020   Other:  10/27/2020  Other:  10/27/2020   Other: 10/27/2020       Scribe for Treatment Team: Vassie Moselle, LCSW 11/10/2020 11:15 AM

## 2020-11-10 NOTE — [facility] Group Notes (Signed)
2Adult Psychoeducational Group Note  Date:  11/10/2020 Time:  11:47 AM  Group Topic/Focus:  Making Healthy Choices:   The focus of this group is to help patients identify negative/unhealthy choices they were using prior to admission and identify positive/healthier coping strategies to replace them upon discharge.  Participation Level:  Active  Participation Quality:  Intrusive  Affect:  Appropriate  Cognitive:  Disorganized  Insight: Appropriate  Engagement in Group:  Engaged  Modes of Intervention:  Discussion  Additional Comments:  Pt attended morning group session.  Paula Dickson 11/10/2020, 11:47 AM

## 2020-11-10 NOTE — Progress Notes (Signed)
DAR NOTE: Patient presents with anxious affect and mood.  Denies suicidal thoughts, auditory and visual hallucinations.  Described energy level as high and concentration as good.  Rates depression at 3, hopelessness at 3, and anxiety at 2.  Maintained on routine safety checks.  Medications given as prescribed.  Support and encouragement offered as needed.  Attended group and participated.  States goal for today is "transportation, family support and respect."  Patient observed socializing with peers in the dayroom.  Patient is safe on and off the unit.  Offered no complaint.

## 2020-11-10 NOTE — Progress Notes (Signed)
[facility] MD Progress Note  11/10/2020 12:46 PM Randy Whitener Brassell  MRN:  [id number] Subjective:  Patient is a 54 year old female with a reported past psychiatric history significant for schizoaffective disorder; bipolar type who originally presented to the [facility] emergency department on 10/25/2020. The patient's sisters had brought her to the emergency department because of worsening psychotic symptoms.  Objective: Patient is seen and examined.  Patient is a 54 year old female with the above-stated past psychiatric history who is seen in follow-up.  Since starting the Clozaril she is really improved.  She is still pressured, intrusive and tangential at times, but her speech is improved to where you understand what she is saying.  She still remains focused on getting out of the hospital and paying her bills.  She still a bit paranoid about her family having to pay her bills.  She is far less disorganized than on the Zyprexa or Haldol or combination.  Her blood pressure still is variable.  Initially it was 150/95, and repeat was 122/95.  Pulse is 82.  She is afebrile.  Her sleep last night was not as good as the night before.  She only got 3.25 hours.  Her CBC from 12/15 was essentially normal.  This includes her differential.  Principal Problem: Schizophrenia ([place]) Diagnosis: Principal Problem:   Schizophrenia ([facility]) Active Problems:   Psychogenic polydipsia   Bipolar affective disorder, current episode mixed ([place])   AMS (altered mental status)  Total Time spent with patient: 20 minutes  Past Psychiatric History: See admission H&P.  Past Medical History:  Past Medical History:  Diagnosis Date  . Bipolar affective disorder ([place])   . History of arthritis   . History of chicken pox   . History of depression   . History of genital warts   . history of heart murmur   . History of high blood pressure   . History of thyroid disease   . History of UTI   . Hypertension    . Low TSH level 07/13/2017  . Schizophrenia [facility])     Past Surgical History:  Procedure Laterality Date  . ABLATION ON ENDOMETRIOSIS    . CYST REMOVAL NECK     around 11 years ago /benign  . MULTIPLE TOOTH EXTRACTIONS     Family History:  Family History  Problem Relation Age of Onset  . Arthritis Father   . Hyperlipidemia Father   . High blood pressure Father   . Diabetes Sister   . Diabetes Mother   . Diabetes Brother   . Mental illness Brother   . Alcohol abuse Paternal Uncle   . Alcohol abuse Paternal Grandfather   . Breast cancer Maternal Aunt   . Breast cancer Paternal Aunt   . High blood pressure Sister   . Mental illness Other        runs in family   Family Psychiatric  History: See admission H&P Social History:  Social History   Substance and Sexual Activity  Alcohol Use No     Social History   Substance and Sexual Activity  Drug Use No    Social History   Socioeconomic History  . Marital status: Single    Spouse name: Not on file  . Number of children: Not on file  . Years of education: Not on file  . Highest education level: Patient refused  Occupational History  . Not on file  Tobacco Use  . Smoking status: Never Smoker  . Smokeless tobacco: Never Used  Vaping Use  . Vaping Use: Never used  Substance and Sexual Activity  . Alcohol use: No  . Drug use: No  . Sexual activity: Not Currently  Other Topics Concern  . Not on file  Social History Narrative  . Not on file   Social Determinants of Health   Financial Resource Strain: Not on file  Food Insecurity: Not on file  Transportation Needs: Not on file  Physical Activity: Not on file  Stress: Not on file  Social Connections: Not on file   Additional Social History:                         Sleep: Fair  Appetite:  Good  Current Medications: Current Facility-Administered Medications  Medication Dose Route Frequency Provider Last Rate Last Admin  . acetaminophen  (TYLENOL) tablet 650 mg  650 mg Oral Q6H PRN Sharma Covert, MD   650 mg at 10/31/20 2157  . alum & mag hydroxide-simeth (MAALOX/MYLANTA) 200-200-20 MG/5ML suspension 30 mL  30 mL Oral Q4H PRN Sharma Covert, MD      . amLODipine (NORVASC) tablet 10 mg  10 mg Oral Daily Sharma Covert, MD   10 mg at 11/10/20 0748  . benztropine (COGENTIN) tablet 1 mg  1 mg Oral BID Ival Bible, MD   1 mg at 11/10/20 0750  . carbamazepine (TEGRETOL) chewable tablet 150 mg  150 mg Oral TID Ival Bible, MD   150 mg at 11/10/20 1136  . carvedilol (COREG) tablet 37.5 mg  37.5 mg Oral BID WC Sharma Covert, MD   37.5 mg at 11/10/20 0748  . cloZAPine (CLOZARIL) tablet 100 mg  100 mg Oral QHS Sharma Covert, MD      . haloperidol (HALDOL) tablet 10 mg  10 mg Oral Q6H PRN Sharma Covert, MD   10 mg at 11/06/20 2159  . haloperidol (HALDOL) tablet 10 mg  10 mg Oral QHS Sharma Covert, MD      . hydrOXYzine (ATARAX/VISTARIL) tablet 25 mg  25 mg Oral TID PRN Sharma Covert, MD   25 mg at 11/09/20 2017  . magnesium hydroxide (MILK OF MAGNESIA) suspension 30 mL  30 mL Oral Daily PRN Sharma Covert, MD      . temazepam (RESTORIL) capsule 30 mg  30 mg Oral QHS Ethelene Hal, NP   30 mg at 11/09/20 2010    Lab Results:  Results for orders placed or performed during the hospital encounter of 10/26/20 (from the past 48 hour(s))  CBC with Differential/Platelet     Status: None   Collection Time: 11/10/20  7:01 AM  Result Value Ref Range   WBC 6.1 4.0 - 10.5 K/uL   RBC 4.61 3.87 - 5.11 MIL/uL   Hemoglobin 13.0 12.0 - 15.0 g/dL   HCT 39.9 36.0 - 46.0 %   MCV 86.6 80.0 - 100.0 fL   MCH 28.2 26.0 - 34.0 pg   MCHC 32.6 30.0 - 36.0 g/dL   RDW 12.3 11.5 - 15.5 %   Platelets 213 150 - 400 K/uL   nRBC 0.0 0.0 - 0.2 %   Neutrophils Relative % 39 %   Neutro Abs 2.4 1.7 - 7.7 K/uL   Lymphocytes Relative 44 %   Lymphs Abs 2.7 0.7 - 4.0 K/uL   Monocytes Relative 9 %    Monocytes Absolute 0.6 0.1 - 1.0 K/uL   Eosinophils Relative 7 %  Eosinophils Absolute 0.4 0.0 - 0.5 K/uL   Basophils Relative 1 %   Basophils Absolute 0.1 0.0 - 0.1 K/uL   Immature Granulocytes 0 %   Abs Immature Granulocytes 0.00 0.00 - 0.07 K/uL    Comment: Performed at [facility], [place] [address] [place],  [address]    Blood Alcohol level:  Lab Results  Component Value Date   [facility] <10 10/25/2020   ETH <10 41/93/7902    Metabolic Disorder Labs: Lab Results  Component Value Date   HGBA1C 6.0 (H) 10/26/2020   MPG 125.5 10/26/2020   MPG 111.15 02/08/2020   Lab Results  Component Value Date   PROLACTIN 11.0 03/14/2019   Lab Results  Component Value Date   CHOL 173 10/26/2020   TRIG 158 (H) 10/26/2020   HDL 46 10/26/2020   CHOLHDL 3.8 10/26/2020   VLDL 32 10/26/2020   LDLCALC 95 10/26/2020   LDLCALC 76 02/08/2020    Physical Findings: AIMS: Facial and Oral Movements Muscles of Facial Expression: None, normal Lips and Perioral Area: None, normal Jaw: None, normal Tongue: None, normal,Extremity Movements Upper (arms, wrists, hands, fingers): None, normal Lower (legs, knees, ankles, toes): None, normal, Trunk Movements Neck, shoulders, hips: None, normal, Overall Severity Severity of abnormal movements (highest score from questions above): None, normal Incapacitation due to abnormal movements: None, normal Patient's awareness of abnormal movements (rate only patient's report): No Awareness, Dental Status Current problems with teeth and/or dentures?: No Does patient usually wear dentures?: No  CIWA:    COWS:     Musculoskeletal: Strength & Muscle Tone: within normal limits Gait & Station: normal Patient leans: N/A  Psychiatric Specialty Exam: Physical Exam Vitals and nursing note reviewed.  HENT:     Head: Normocephalic and atraumatic.  Pulmonary:     Effort: Pulmonary effort is normal.  Neurological:     General: No focal  deficit present.     Mental Status: She is alert and oriented to person, place, and time.     Review of Systems  Blood pressure (!) 122/95, pulse 87, temperature (!) 97.4 F (36.3 C), temperature source Oral, resp. rate 20, height 5\' 1"  (1.549 m), weight 67.1 kg, SpO2 99 %.Body mass index is 27.96 kg/m.  General Appearance: Casual  Eye Contact:  Fair  Speech:  Pressured  Volume:  Increased  Mood:  Anxious and Euphoric  Affect:  Labile  Thought Process:  Coherent and Descriptions of Associations: Tangential  Orientation:  Full (Time, Place, and Person)  Thought Content:  Delusions, Paranoid Ideation, Rumination and Tangential  Suicidal Thoughts:  No  Homicidal Thoughts:  No  Memory:  Immediate;   Fair Recent;   Fair Remote;   Fair  Judgement:  Intact  Insight:  Fair  Psychomotor Activity:  Increased  Concentration:  Concentration: Poor and Attention Span: Poor  Recall:  [employer] of Knowledge:  Fair  Language:  Good  Akathisia:  Negative  Handed:  Right  AIMS (if indicated):     Assets:  Desire for Improvement Resilience  ADL's:  Intact  Cognition:  WNL  Sleep:  Number of Hours: 3.25     Treatment Plan Summary: Daily contact with patient to assess and evaluate symptoms and progress in treatment, Medication management and Plan : Patient is seen and examined.  Patient is a 54 year old female with the above-stated past psychiatric history who is seen in follow-up.   Diagnosis: 1. Schizoaffective disorder; bipolar type 2. Hypertension 3. Hyperthyroidism 4. Diabetes  mellitus type 2 5. Multiple thyroid nodules 6. Multinodular goiter  Pertinent findings on examination today: 1.  Her disorganization has decreased significantly.  She remains somewhat manic and tangential. 2.  Sleep last night was not as good. 3.  Blood pressure remains somewhat labile.  Plan: 1.  Continue amlodipine 10 mg p.o. daily for hypertension. 2.  Continue Cogentin 1 mg p.o. twice daily  for side effects of medication. 3.  Continue Tegretol 150 mg p.o. 3 times daily for mood stability. 4.  Increase carvedilol to 37.5 mg p.o. twice daily for hypertension. 6.  Increase Clozaril to 100 mg p.o. nightly and titrate.  This is for psychosis. 7.  Continue haloperidol 10 mg p.o. every 6 hours as needed agitation. 8.  Decrease Haldol to 10 mg p.o. nightly for psychosis. 9.  Continue temazepam 30 mg p.o. nightly for insomnia. 10.  Disposition planning-in progress.  Sharma Covert, MD 11/10/2020, 12:46 PM

## 2020-11-10 NOTE — Progress Notes (Signed)
Pt has been calm and cooperative with care, pt took her night time medications without issues. Pt went to bed but took  A while before falling a sleep. Will continue to monitor.

## 2020-11-11 MED ORDER — CLOZAPINE 25 MG PO TABS
25.0000 mg | ORAL_TABLET | Freq: Every day | ORAL | Status: DC
  Administered 2020-11-11 – 2020-11-12 (×2): 25 mg via ORAL
  Filled 2020-11-11 (×2): qty 1
  Filled 2020-11-11: qty 7
  Filled 2020-11-11: qty 1

## 2020-11-11 NOTE — [facility] Group Notes (Signed)
Occupational Therapy Group Note Date: 11/11/2020 Group Topic/Focus: Socialization/Social Skills  Group Description: Group encouraged increased social engagement and participation through discussion/activity focused on improving socialization and age appropriate social skills. Patients were encouraged to fill out a structured worksheet with the following three prompts: one thing I want you to know about me, the hardest thing that I am dealing with today is, and my goal for today is... Discussion followed with patients sharing their responses and then transitioned into an interactive "Name 5" activity to promote socialization and orientation.   Therapeutic Goal(s): Demonstrate age-appropriate social skills and socialization within a structured group setting Demonstrate orientation to topic and identify relevant responses to group discussion.  Participation Level: Active   Participation Quality: Moderate Cues   Behavior: Perseverative/Ruminative   Speech/Thought Process: Tangential   Affect/Mood: Constricted   Insight: Limited   Judgement: Limited   Individualization: Paula Dickson was receptive to group discussion with moderate verbal cues from this clinician. Pt was mostly on topic and relevant in her responses, though would trail off tangentially on a topic that was mumbled and unable to be understood. Pt identified Kit Kats as one of her favorite candy bars and talked about having two nieces close in age with December birthdays. Disorganized, though pleasantly interactive.   Modes of Intervention: Discussion, Education and Socialization  Patient Response to Interventions:  Attentive   Plan: Continue to engage patient in OT groups 2 - 3x/week.  11/11/2020  Ponciano Ort, MOT, OTR/L

## 2020-11-11 NOTE — Progress Notes (Signed)
   11/11/20 2317  Psych Admission Type (Psych Patients Only)  Admission Status Voluntary  Psychosocial Assessment  Patient Complaints None  Eye Contact Fair;Intense  Facial Expression Animated;Pensive  Affect Preoccupied;Silly  Speech Pressured;Tangential;Rhyming  Interaction Assertive;Attention-seeking  Motor Activity Fidgety;Restless  Appearance/Hygiene Disheveled  Behavior Characteristics Cooperative  Mood Pleasant  Thought Process  Coherency Disorganized;Flight of ideas  Content Obsessions;Religiosity  Delusions Paranoid;Religious  Perception Derealization  Hallucination None reported or observed  Judgment Limited  Confusion Mild  Danger to Self  Current suicidal ideation? Denies  Danger to Others  Danger to Others None reported or observed

## 2020-11-11 NOTE — Progress Notes (Signed)
[facility] MD Progress Note  11/11/2020 11:36 AM Paula Dickson  MRN:  [id number] Subjective:  Patient is a 54 year old female with a reported past psychiatric history significant for schizoaffective disorder; bipolar type who originally presented to the [facility] emergency department on 10/25/2020. The patient's sisters had brought her to the emergency department because of worsening psychotic symptoms.  Objective: Patient is seen and examined.  Patient is a 54 year old female with the above-stated past psychiatric history who is seen in follow-up.  She continues to slowly improve with the addition of the Clozaril.  Her speech rate has decreased.  She is having some mild hypomanic symptoms with regard to religious thinking, but is far more appropriate than she has been.  Her sleep is good with the Clozaril.  She denied any side effects to her current medications.  She is focused on discharge.  We discussed the discharge plan for hopefully discharge in approximately 2 to 3 days.  We did stop her one-to-one today.  She denied any auditory or visual hallucinations.  Her speech rate was mildly increased, but her paranoia was decreased.  Her blood pressure is 135/97.  Pulse is 86.  She is afebrile.  Pulse oximetry on room air is 100%.  Her CBC from 12/15 was essentially normal including the differential.  She slept 8.5 hours last night.  Principal Problem: Schizophrenia ([place]) Diagnosis: Principal Problem:   Schizophrenia ([place]) Active Problems:   Psychogenic polydipsia   Bipolar affective disorder, current episode mixed ([place])   AMS (altered mental status)  Total Time spent with patient: 20 minutes  Past Psychiatric History: See admission H&P  Past Medical History:  Past Medical History:  Diagnosis Date  . Bipolar affective disorder ([place])   . History of arthritis   . History of chicken pox   . History of depression   . History of genital warts   . history of heart murmur    . History of high blood pressure   . History of thyroid disease   . History of UTI   . Hypertension   . Low TSH level 07/13/2017  . Schizophrenia [facility])     Past Surgical History:  Procedure Laterality Date  . ABLATION ON ENDOMETRIOSIS    . CYST REMOVAL NECK     around 11 years ago /benign  . MULTIPLE TOOTH EXTRACTIONS     Family History:  Family History  Problem Relation Age of Onset  . Arthritis Father   . Hyperlipidemia Father   . High blood pressure Father   . Diabetes Sister   . Diabetes Mother   . Diabetes Brother   . Mental illness Brother   . Alcohol abuse Paternal Uncle   . Alcohol abuse Paternal Grandfather   . Breast cancer Maternal Aunt   . Breast cancer Paternal Aunt   . High blood pressure Sister   . Mental illness Other        runs in family   Family Psychiatric  History: See admission H&P Social History:  Social History   Substance and Sexual Activity  Alcohol Use No     Social History   Substance and Sexual Activity  Drug Use No    Social History   Socioeconomic History  . Marital status: Single    Spouse name: Not on file  . Number of children: Not on file  . Years of education: Not on file  . Highest education level: Patient refused  Occupational History  . Not on file  Tobacco Use  . Smoking status: Never Smoker  . Smokeless tobacco: Never Used  Vaping Use  . Vaping Use: Never used  Substance and Sexual Activity  . Alcohol use: No  . Drug use: No  . Sexual activity: Not Currently  Other Topics Concern  . Not on file  Social History Narrative  . Not on file   Social Determinants of Health   Financial Resource Strain: Not on file  Food Insecurity: Not on file  Transportation Needs: Not on file  Physical Activity: Not on file  Stress: Not on file  Social Connections: Not on file   Additional Social History:                         Sleep: Good  Appetite:  Fair  Current Medications: Current  Facility-Administered Medications  Medication Dose Route Frequency Provider Last Rate Last Admin  . acetaminophen (TYLENOL) tablet 650 mg  650 mg Oral Q6H PRN Sharma Covert, MD   650 mg at 10/31/20 2157  . alum & mag hydroxide-simeth (MAALOX/MYLANTA) 200-200-20 MG/5ML suspension 30 mL  30 mL Oral Q4H PRN Sharma Covert, MD      . amLODipine (NORVASC) tablet 10 mg  10 mg Oral Daily Sharma Covert, MD   10 mg at 11/11/20 0811  . benztropine (COGENTIN) tablet 1 mg  1 mg Oral BID Ival Bible, MD   1 mg at 11/11/20 0810  . carbamazepine (TEGRETOL) chewable tablet 150 mg  150 mg Oral TID Ival Bible, MD   150 mg at 11/11/20 0810  . carvedilol (COREG) tablet 37.5 mg  37.5 mg Oral BID WC Sharma Covert, MD   37.5 mg at 11/11/20 0811  . cloZAPine (CLOZARIL) tablet 100 mg  100 mg Oral QHS Sharma Covert, MD   100 mg at 11/10/20 2049  . cloZAPine (CLOZARIL) tablet 25 mg  25 mg Oral Daily Sharma Covert, MD      . haloperidol (HALDOL) tablet 10 mg  10 mg Oral Q6H PRN Sharma Covert, MD   10 mg at 11/11/20 3716  . hydrOXYzine (ATARAX/VISTARIL) tablet 25 mg  25 mg Oral TID PRN Sharma Covert, MD   25 mg at 11/10/20 2050  . magnesium hydroxide (MILK OF MAGNESIA) suspension 30 mL  30 mL Oral Daily PRN Sharma Covert, MD      . temazepam (RESTORIL) capsule 30 mg  30 mg Oral QHS Ethelene Hal, NP   30 mg at 11/10/20 2049    Lab Results:  Results for orders placed or performed during the hospital encounter of 10/26/20 (from the past 48 hour(s))  CBC with Differential/Platelet     Status: None   Collection Time: 11/10/20  7:01 AM  Result Value Ref Range   WBC 6.1 4.0 - 10.5 K/uL   RBC 4.61 3.87 - 5.11 MIL/uL   Hemoglobin 13.0 12.0 - 15.0 g/dL   HCT 39.9 36.0 - 46.0 %   MCV 86.6 80.0 - 100.0 fL   MCH 28.2 26.0 - 34.0 pg   MCHC 32.6 30.0 - 36.0 g/dL   RDW 12.3 11.5 - 15.5 %   Platelets 213 150 - 400 K/uL   nRBC 0.0 0.0 - 0.2 %   Neutrophils  Relative % 39 %   Neutro Abs 2.4 1.7 - 7.7 K/uL   Lymphocytes Relative 44 %   Lymphs Abs 2.7 0.7 - 4.0 K/uL   Monocytes  Relative 9 %   Monocytes Absolute 0.6 0.1 - 1.0 K/uL   Eosinophils Relative 7 %   Eosinophils Absolute 0.4 0.0 - 0.5 K/uL   Basophils Relative 1 %   Basophils Absolute 0.1 0.0 - 0.1 K/uL   Immature Granulocytes 0 %   Abs Immature Granulocytes 0.00 0.00 - 0.07 K/uL    Comment: Performed at [facility], [place] [address] [place], [place] [address]    Blood Alcohol level:  Lab Results  Component Value Date   [facility] <10 10/25/2020   ETH <10 64/15/8309    Metabolic Disorder Labs: Lab Results  Component Value Date   HGBA1C 6.0 (H) 10/26/2020   MPG 125.5 10/26/2020   MPG 111.15 02/08/2020   Lab Results  Component Value Date   PROLACTIN 11.0 03/14/2019   Lab Results  Component Value Date   CHOL 173 10/26/2020   TRIG 158 (H) 10/26/2020   HDL 46 10/26/2020   CHOLHDL 3.8 10/26/2020   VLDL 32 10/26/2020   LDLCALC 95 10/26/2020   LDLCALC 76 02/08/2020    Physical Findings: AIMS: Facial and Oral Movements Muscles of Facial Expression: None, normal Lips and Perioral Area: None, normal Jaw: None, normal Tongue: None, normal,Extremity Movements Upper (arms, wrists, hands, fingers): None, normal Lower (legs, knees, ankles, toes): None, normal, Trunk Movements Neck, shoulders, hips: None, normal, Overall Severity Severity of abnormal movements (highest score from questions above): None, normal Incapacitation due to abnormal movements: None, normal Patient's awareness of abnormal movements (rate only patient's report): No Awareness, Dental Status Current problems with teeth and/or dentures?: No Does patient usually wear dentures?: No  CIWA:    COWS:     Musculoskeletal: Strength & Muscle Tone: within normal limits Gait & Station: normal Patient leans: N/A  Psychiatric Specialty Exam: Physical Exam Vitals and nursing note reviewed.   Constitutional:      Appearance: Normal appearance.  HENT:     Head: Normocephalic and atraumatic.  Pulmonary:     Effort: Pulmonary effort is normal.  Neurological:     General: No focal deficit present.     Mental Status: She is alert and oriented to person, place, and time.     Review of Systems  Blood pressure (!) 135/97, pulse 86, temperature 97.8 F (36.6 C), temperature source Oral, resp. rate 20, height 5\' 1"  (1.549 m), weight 67.1 kg, SpO2 100 %.Body mass index is 27.96 kg/m.  General Appearance: Casual  Eye Contact:  Fair  Speech:  Pressured  Volume:  Normal  Mood:  Euphoric  Affect:  Congruent  Thought Process:  Goal Directed and Descriptions of Associations: Tangential  Orientation:  Negative  Thought Content:  Delusions and Paranoid Ideation  Suicidal Thoughts:  No  Homicidal Thoughts:  No  Memory:  Immediate;   Fair Recent;   Fair Remote;   Fair  Judgement:  Intact  Insight:  Fair  Psychomotor Activity:  Increased  Concentration:  Concentration: Fair and Attention Span: Fair  Recall:  [employer] of Knowledge:  Fair  Language:  Good  Akathisia:  Negative  Handed:  Right  AIMS (if indicated):     Assets:  Desire for Improvement Housing Resilience Social Support  ADL's:  Intact  Cognition:  WNL  Sleep:  Number of Hours: 8.5     Treatment Plan Summary: Daily contact with patient to assess and evaluate symptoms and progress in treatment, Medication management and Plan : Patient is seen and examined.  Patient is a 54 year old female  with the above-stated past psychiatric history who is seen in follow-up.   Diagnosis: 1. Schizoaffective disorder; bipolar type 2. Hypertension 3. Hyperthyroidism 4. Diabetes mellitus type 2 5. Multiple thyroid nodules 6. Multinodular goiter  Pertinent findings on examination today: 1.  Disorganization continues to decrease. 2.  Mildly hypomanic today, mildly tangential. 3.  Sleep is improved. 4.  Blood  pressure is stable.  Plan: Plan: 1. Continue amlodipine 10 mg p.o. daily for hypertension. 2. Continue Cogentin 1 mg p.o. twice daily for side effects of medication. 3. Continue Tegretol 150 mg p.o. 3 times daily for mood stability. 4.  Continue carvedilol to 37.5 mg p.o. twice daily for hypertension. 6.  Increase Clozaril to 25 mg p.o. daily and 100 mg p.o. nightly and titrate. This is for psychosis. 7. Continue Haldol 10 mg p.o. or IM every 6 hours as needed agitation.. 8. Stop Haldol nightly.  9. Continue temazepam 30 mg p.o. nightly for insomnia. 10.  Tegretol level and complete metabolic panel in a.m. tomorrow. 11. Disposition planning-in progress.  Sharma Covert, MD 11/11/2020, 11:36 AM

## 2020-11-11 NOTE — [facility] Group Notes (Signed)
The focus of this group is to help patients establish daily goals to achieve during treatment and discuss how the patient can incorporate goal setting into their daily lives to aide in recovery.  Pt did not attend group 

## 2020-11-11 NOTE — Progress Notes (Signed)
Recreation Therapy Notes  Date: 12.16.21 Time: 4961-1643 Location: [address] Dayroom  Group Topic: Self-Esteem  Goal Area(s) Addresses:  Patient will identify positive character traits about themselves. Patient will reflect on positive ways to increase self-esteem. Patient will verbalize benefit of increased self-esteem.  Behavioral Response: Engaged  Intervention: Blank license plates, Markers, Colored pencils  Activity: Personalized License Plates.  Patients were to create a personalized license plate to highlight some of the positive things about themselves.  Patients could highlights things such as were they are from, sports teams they like, personality traits, important people to them, etc.  Education:  Self-esteem, Discharge planning  Education Outcome: Acknowledges understanding/In group clarification offered/Needs additional education  Clinical Observations/Feedback: Pt had a lot of incoherent thoughts.  Pt would laugh and ramble about different things that were hard to make out.  Pt did share her license plate number on her plate.  Pt also wrote queen and princess on he plate.  Pt also talked about seeing blue lights in the window and not having done anything wrong.  Pt explained "love is the key commandment".  Pt went on to express "he makes you white as snow", "apple jacks are good" and "I don't need no shackles on my feet or my mind".    [name], LRT/CTRS    Ria Comment, Irving Bloor A 11/11/2020 11:08 AM

## 2020-11-11 NOTE — Progress Notes (Signed)
Pharmacy_Clozapine  This patient's order has been reviewed for prescribing contraindications.  Labs: ANC= 2.4 reported 11-10-20 The medication is being dispensed pursuant to the FDA REMS suspension order of 10/15/20 that allows for dispensing without a patient REMS dispense authorization (RDA).  Dennison Mascot, RPh

## 2020-11-12 ENCOUNTER — Other Ambulatory Visit (HOSPITAL_COMMUNITY): Payer: Self-pay | Admitting: Psychiatry

## 2020-11-12 MED ORDER — HYDROXYZINE HCL 25 MG PO TABS: 25.0000 mg | ORAL_TABLET | Freq: Three times a day (TID) | ORAL | 0 refills | Status: DC | PRN

## 2020-11-12 MED ORDER — BENZTROPINE MESYLATE 1 MG PO TABS: 1.0000 mg | ORAL_TABLET | Freq: Two times a day (BID) | ORAL | 0 refills | Status: DC

## 2020-11-12 MED ORDER — CARVEDILOL 12.5 MG PO TABS: 37.5000 mg | ORAL_TABLET | Freq: Two times a day (BID) | ORAL | 0 refills | Status: DC

## 2020-11-12 MED ORDER — CLOZAPINE 100 MG PO TABS: ORAL_TABLET | ORAL | 0 refills | Status: DC

## 2020-11-12 MED ORDER — AMLODIPINE BESYLATE 10 MG PO TABS: 10.0000 mg | ORAL_TABLET | Freq: Every day | ORAL | 0 refills | Status: DC

## 2020-11-12 MED ORDER — CARBAMAZEPINE 100 MG PO CHEW: 150.0000 mg | CHEWABLE_TABLET | Freq: Three times a day (TID) | ORAL | 0 refills | Status: DC

## 2020-11-12 MED ORDER — CLOZAPINE 25 MG PO TABS: 25.0000 mg | ORAL_TABLET | Freq: Every day | ORAL | 0 refills | Status: DC

## 2020-11-12 MED ORDER — CLOZAPINE 100 MG PO TABS: 100.0000 mg | ORAL_TABLET | Freq: Every day | ORAL | 0 refills | Status: DC

## 2020-11-12 MED ORDER — TEMAZEPAM 30 MG PO CAPS: 30.0000 mg | ORAL_CAPSULE | Freq: Every day | ORAL | 0 refills | Status: DC

## 2020-11-12 NOTE — Progress Notes (Signed)
  [facility] Adult Case Management Discharge Plan :  Will you be returning to the same living situation after discharge:  Yes,  to home At discharge, do you have transportation home?: Yes,  sister to pick this pt up Do you have the ability to pay for your medications: Yes,  has insurance  Release of information consent forms completed and in the chart;  Patient's signature needed at discharge.  Patient to Follow up at:  Follow-up Information    [name] Follow up on 11/15/2020.   Why: You have an appointment over the phone for medication management on 11/15/20 at 12:45 pm. Phone number for appointment is [phone]. Contact information: [address]  [address] [place] [address] [place]. Go on 12/06/2020.   Why: You have an appointment for primary care services on 12/07/19 at 1:30 pm.  This appointment will be held in person.  Contact information: [place] [id number] [phone]              Next level of care provider has access to [place] and Suicide Prevention discussed: Yes,  with sister     Has patient been referred to the Quitline?: N/A patient is not a smoker  Patient has been referred for addiction treatment: N/A  Vassie Moselle, LCSW 11/12/2020, 2:29 PM

## 2020-11-12 NOTE — [facility] Suicide Risk Assessment (Addendum)
[facility] Discharge Suicide Risk Assessment   Principal Problem: Schizophrenia [facility]) Discharge Diagnoses: Principal Problem:   Schizophrenia ([place]) Active Problems:   Psychogenic polydipsia   Bipolar affective disorder, current episode mixed ([place])   AMS (altered mental status)   Total Time spent with patient: 30 minutes  Musculoskeletal: Strength & Muscle Tone: within normal limits Gait & Station: normal Patient leans: N/A  Psychiatric Specialty Exam: Review of Systems  All other systems reviewed and are negative.   Blood pressure 135/82, pulse 87, temperature 98.6 F (37 C), temperature source Oral, resp. rate 20, height 5\' 1"  (1.549 m), weight 67.1 kg, SpO2 100 %.Body mass index is 27.96 kg/m.  General Appearance: Fairly Groomed  Engineer, water::  Good  Speech:  Pressured409  Volume:  Normal  Mood:  Anxious  Affect:  Congruent  Thought Process:  Goal Directed and Descriptions of Associations: Intact  Orientation:  Full (Time, Place, and Person)  Thought Content:  Delusions and Rumination  Suicidal Thoughts:  No  Homicidal Thoughts:  No  Memory:  Immediate;   Fair Recent;   Fair Remote;   Fair  Judgement:  Intact  Insight:  Fair  Psychomotor Activity:  Increased  Concentration:  Fair  Recall:  [employer] of Knowledge:Fair  Language: Fair  Akathisia:  Negative  Handed:  Right  AIMS (if indicated):     Assets:  Desire for Improvement Financial Resources/Insurance Housing Resilience Social Support  Sleep:  Number of Hours: 6.5  Cognition: WNL  ADL's:  Intact   Mental Status Per Nursing Assessment::   On Admission:  NA  Demographic Factors:  Low socioeconomic status, Living alone and Unemployed  Loss Factors: NA  Historical Factors: Impulsivity  Risk Reduction Factors:   Positive social support  Continued Clinical Symptoms:  Schizophrenia:   Paranoid or undifferentiated type  Cognitive Features That Contribute To Risk:  Thought constriction (tunnel  vision)    Suicide Risk:  Minimal: No identifiable suicidal ideation.  Patients presenting with no risk factors but with morbid ruminations; may be classified as minimal risk based on the severity of the depressive symptoms   Follow-up Information    [name] Follow up on 11/15/2020.   Why: You have an appointment over the phone for medication management on 11/15/20 at 12:45 pm. Phone number for appointment is [phone]. Contact information: [address]  [place] [address] [phone]               Plan Of Care/Follow-up recommendations:  Activity:  Ad lib. Take medications as directed. Follow up with PCP for new onset diabetes and yor blodd pressure problems. Follow up with psychiatry for your clozaril, blood tests and medication management. Diet:  diabetic Tests:  You need to follow up with your PCP with regards to your thyroid as well. You have a goiter with nodules and these nodules need to be biopsied. Other:   Take medications as directed. Follow up with PCP for new onset diabetes and yor blodd pressure problems. Follow up with psychiatry for your clozaril, blood tests and medication management.  Sharma Covert, MD 11/12/2020, 7:53 AM

## 2020-11-12 NOTE — Plan of Care (Signed)
Discharge note  Patient verbalizes readiness for discharge. Follow up plan explained, AVS, Transition record and SRA given. Prescriptions and teaching provided. Belongings returned and signed for. Suicide safety plan completed and signed. Patient verbalizes understanding. Patient denies SI/HI and assures this Probation officer they will seek assistance should that change. Patient discharged to lobby where family was waiting.  Problem: Education: Goal: Knowledge of [facility] Education information/materials will improve Outcome: Adequate for Discharge Goal: Emotional status will improve Outcome: Adequate for Discharge Goal: Mental status will improve Outcome: Adequate for Discharge Goal: Verbalization of understanding the information provided will improve Outcome: Adequate for Discharge   Problem: Activity: Goal: Interest or engagement in activities will improve Outcome: Adequate for Discharge Goal: Sleeping patterns will improve Outcome: Adequate for Discharge   Problem: Coping: Goal: Ability to verbalize frustrations and anger appropriately will improve Outcome: Adequate for Discharge Goal: Ability to demonstrate self-control will improve Outcome: Adequate for Discharge   Problem: Health Behavior/Discharge Planning: Goal: Identification of resources available to assist in meeting health care needs will improve Outcome: Adequate for Discharge Goal: Compliance with treatment plan for underlying cause of condition will improve Outcome: Adequate for Discharge   Problem: Physical Regulation: Goal: Ability to maintain clinical measurements within normal limits will improve Outcome: Adequate for Discharge   Problem: Safety: Goal: Periods of time without injury will increase Outcome: Adequate for Discharge   Problem: Education: Goal: Ability to state activities that reduce stress will improve Outcome: Adequate for Discharge   Problem: Coping: Goal: Ability to identify and develop  effective coping behavior will improve Outcome: Adequate for Discharge   Problem: Self-Concept: Goal: Ability to identify factors that promote anxiety will improve Outcome: Adequate for Discharge Goal: Level of anxiety will decrease Outcome: Adequate for Discharge Goal: Ability to modify response to factors that promote anxiety will improve Outcome: Adequate for Discharge   Problem: Education: Goal: Utilization of techniques to improve thought processes will improve Outcome: Adequate for Discharge Goal: Knowledge of the prescribed therapeutic regimen will improve Outcome: Adequate for Discharge   Problem: Activity: Goal: Interest or engagement in leisure activities will improve Outcome: Adequate for Discharge Goal: Imbalance in normal sleep/wake cycle will improve Outcome: Adequate for Discharge   Problem: Coping: Goal: Coping ability will improve Outcome: Adequate for Discharge Goal: Will verbalize feelings Outcome: Adequate for Discharge   Problem: Health Behavior/Discharge Planning: Goal: Ability to make decisions will improve Outcome: Adequate for Discharge Goal: Compliance with therapeutic regimen will improve Outcome: Adequate for Discharge   Problem: Role Relationship: Goal: Will demonstrate positive changes in social behaviors and relationships Outcome: Adequate for Discharge   Problem: Safety: Goal: Ability to disclose and discuss suicidal ideas will improve Outcome: Adequate for Discharge Goal: Ability to identify and utilize support systems that promote safety will improve Outcome: Adequate for Discharge   Problem: Self-Concept: Goal: Will verbalize positive feelings about self Outcome: Adequate for Discharge Goal: Level of anxiety will decrease Outcome: Adequate for Discharge   Problem: Activity: Goal: Will verbalize the importance of balancing activity with adequate rest periods Outcome: Adequate for Discharge   Problem: Education: Goal: Will be  free of psychotic symptoms Outcome: Adequate for Discharge Goal: Knowledge of the prescribed therapeutic regimen will improve Outcome: Adequate for Discharge   Problem: Coping: Goal: Coping ability will improve Outcome: Adequate for Discharge Goal: Will verbalize feelings Outcome: Adequate for Discharge   Problem: Health Behavior/Discharge Planning: Goal: Compliance with prescribed medication regimen will improve Outcome: Adequate for Discharge   Problem: Nutritional: Goal: Ability  to achieve adequate nutritional intake will improve Outcome: Adequate for Discharge   Problem: Role Relationship: Goal: Ability to communicate needs accurately will improve Outcome: Adequate for Discharge Goal: Ability to interact with others will improve Outcome: Adequate for Discharge   Problem: Safety: Goal: Ability to redirect hostility and anger into socially appropriate behaviors will improve Outcome: Adequate for Discharge Goal: Ability to remain free from injury will improve Outcome: Adequate for Discharge   Problem: Self-Care: Goal: Ability to participate in self-care as condition permits will improve Outcome: Adequate for Discharge   Problem: Self-Concept: Goal: Will verbalize positive feelings about self Outcome: Adequate for Discharge

## 2020-11-12 NOTE — [facility] Group Notes (Signed)
[facility] LCSW Group Therapy  11/12/2020 12:11 PM  Type of Therapy and Topic: Group Therapy: Anger Management   Description of Group: In this group, patients will learn helpful strategies and techniques to manage anger, express anger in alternative ways, change hostile attitudes, and prevent aggressive acts, such as verbal abuse and violence.This group will be process-oriented and eductional, with patients participating in exploration of their own experiences as well as giving and receiving support and challenge from other group members.  Therapeutic Goals: 1. Patient will learn to manage anger. 2. Patient will learn to stop violence or the threat of violence. 3. Patient will learn to develop self control over thoughts and actions. 4. Patient will receive support and feedback from others   Participation Level:  Minimal  Participation Quality:  Sharing  Affect:  Excited  Cognitive:  Disorganized  Insight:  Improving  Engagement in Therapy:  Limited  Therapeutic Modalities: Cognitive Behavioral Therapy Solution Focused Therapy Motivational Interviewing  Summary of Progress/Problems: Patient came to group towards the end. Patient was able to share triggers to her emotions.  Reginia Battie A Nollie Shiflett 11/12/2020, 12:11 PM
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
## 2020-12-03 ENCOUNTER — Other Ambulatory Visit (HOSPITAL_COMMUNITY): Payer: Self-pay | Admitting: Psychiatry

## 2020-12-06 ENCOUNTER — Telehealth (INDEPENDENT_AMBULATORY_CARE_PROVIDER_SITE_OTHER): Payer: 59 | Admitting: Primary Care

## 2020-12-07 ENCOUNTER — Other Ambulatory Visit (HOSPITAL_COMMUNITY): Payer: Self-pay | Admitting: Psychiatry

## 2020-12-07 ENCOUNTER — Other Ambulatory Visit: Payer: Self-pay | Admitting: Internal Medicine

## 2020-12-07 DIAGNOSIS — Z1231 Encounter for screening mammogram for malignant neoplasm of breast: Secondary | ICD-10-CM

## 2020-12-29 ENCOUNTER — Emergency Department (HOSPITAL_COMMUNITY)
Admission: EM | Admit: 2020-12-29 | Discharge: 2020-12-31 | Disposition: A | Discharge status: Other Acute Inpatient Hospital | Payer: 59 | Attending: Emergency Medicine | Admitting: Emergency Medicine

## 2020-12-29 ENCOUNTER — Other Ambulatory Visit: Payer: Self-pay

## 2020-12-29 DIAGNOSIS — Z20822 Contact with and (suspected) exposure to covid-19: Secondary | ICD-10-CM | POA: Diagnosis not present

## 2020-12-29 DIAGNOSIS — F319 Bipolar disorder, unspecified: Secondary | ICD-10-CM | POA: Diagnosis not present

## 2020-12-29 DIAGNOSIS — I10 Essential (primary) hypertension: Secondary | ICD-10-CM | POA: Insufficient documentation

## 2020-12-29 DIAGNOSIS — Z79899 Other long term (current) drug therapy: Secondary | ICD-10-CM | POA: Diagnosis not present

## 2020-12-29 DIAGNOSIS — R4689 Other symptoms and signs involving appearance and behavior: Secondary | ICD-10-CM

## 2020-12-29 DIAGNOSIS — Z046 Encounter for general psychiatric examination, requested by authority: Secondary | ICD-10-CM | POA: Diagnosis present

## 2020-12-29 DIAGNOSIS — F99 Mental disorder, not otherwise specified: Secondary | ICD-10-CM

## 2020-12-29 LAB — CBC WITH DIFFERENTIAL/PLATELET
Abs Immature Granulocytes: 0.03 10*3/uL (ref 0.00–0.07)
Basophils Absolute: 0 10*3/uL (ref 0.0–0.1)
Basophils Relative: 0 %
Eosinophils Absolute: 0.1 10*3/uL (ref 0.0–0.5)
Eosinophils Relative: 1 %
HCT: 43.6 % (ref 36.0–46.0)
Hemoglobin: 14.6 g/dL (ref 12.0–15.0)
Immature Granulocytes: 0 %
Lymphocytes Relative: 21 %
Lymphs Abs: 2.6 10*3/uL (ref 0.7–4.0)
MCH: 28.3 pg (ref 26.0–34.0)
MCHC: 33.5 g/dL (ref 30.0–36.0)
MCV: 84.7 fL (ref 80.0–100.0)
Monocytes Absolute: 0.9 10*3/uL (ref 0.1–1.0)
Monocytes Relative: 7 %
Neutro Abs: 8.7 10*3/uL — ABNORMAL HIGH (ref 1.7–7.7)
Neutrophils Relative %: 71 %
Platelets: 293 10*3/uL (ref 150–400)
RBC: 5.15 MIL/uL — ABNORMAL HIGH (ref 3.87–5.11)
RDW: 13 % (ref 11.5–15.5)
WBC: 12.3 10*3/uL — ABNORMAL HIGH (ref 4.0–10.5)
nRBC: 0 % (ref 0.0–0.2)

## 2020-12-29 LAB — COMPREHENSIVE METABOLIC PANEL
ALT: 38 U/L (ref 0–44)
AST: 25 U/L (ref 15–41)
Albumin: 4.9 g/dL (ref 3.5–5.0)
Alkaline Phosphatase: 85 U/L (ref 38–126)
Anion gap: 17 — ABNORMAL HIGH (ref 5–15)
BUN: 20 mg/dL (ref 6–20)
CO2: 22 mmol/L (ref 22–32)
Calcium: 10.8 mg/dL — ABNORMAL HIGH (ref 8.9–10.3)
Chloride: 103 mmol/L (ref 98–111)
Creatinine, Ser: 1.33 mg/dL — ABNORMAL HIGH (ref 0.44–1.00)
GFR, Estimated: 47 mL/min — ABNORMAL LOW (ref >60)
Glucose, Bld: 207 mg/dL — ABNORMAL HIGH (ref 70–99)
Potassium: 3.7 mmol/L (ref 3.5–5.1)
Sodium: 142 mmol/L (ref 135–145)
Total Bilirubin: 0.6 mg/dL (ref 0.3–1.2)
Total Protein: 8.3 g/dL — ABNORMAL HIGH (ref 6.5–8.1)

## 2020-12-29 LAB — VALPROIC ACID LEVEL: Valproic Acid Lvl: <10 ug/mL — ABNORMAL LOW (ref 50.0–100.0)

## 2020-12-29 LAB — SALICYLATE LEVEL: Salicylate Lvl: <7 mg/dL — ABNORMAL LOW (ref 7.0–30.0)

## 2020-12-29 LAB — ETHANOL: Alcohol, Ethyl (B): <10 mg/dL (ref <10)

## 2020-12-29 LAB — CK: Total CK: 145 U/L (ref 38–234)

## 2020-12-29 LAB — ACETAMINOPHEN LEVEL: Acetaminophen (Tylenol), Serum: <10 ug/mL — ABNORMAL LOW (ref 10–30)

## 2020-12-29 MED ORDER — LORAZEPAM 2 MG/ML IJ SOLN
2.0000 mg | Freq: Once | INTRAMUSCULAR | Status: AC
  Administered 2020-12-29: 2 mg via INTRAMUSCULAR
  Filled 2020-12-29: qty 1

## 2020-12-29 MED ORDER — ZIPRASIDONE MESYLATE 20 MG IM SOLR
20.0000 mg | Freq: Once | INTRAMUSCULAR | Status: AC
  Administered 2020-12-29: 20 mg via INTRAMUSCULAR
  Filled 2020-12-29: qty 20

## 2020-12-29 MED ORDER — STERILE WATER FOR INJECTION IJ SOLN
INTRAMUSCULAR | Status: AC
  Administered 2020-12-29: 2.1 mL
  Filled 2020-12-29: qty 10

## 2020-12-29 MED ORDER — DIPHENHYDRAMINE HCL 50 MG/ML IJ SOLN
50.0000 mg | Freq: Once | INTRAMUSCULAR | Status: AC
  Administered 2020-12-29: 50 mg via INTRAMUSCULAR
  Filled 2020-12-29: qty 1

## 2020-12-29 MED ORDER — CARBAMAZEPINE 100 MG PO CHEW
150.0000 mg | CHEWABLE_TABLET | Freq: Three times a day (TID) | ORAL | Status: DC
  Administered 2020-12-30 (×3): 150 mg via ORAL
  Filled 2020-12-29 (×6): qty 1.5

## 2020-12-29 MED ORDER — CARVEDILOL 12.5 MG PO TABS
25.0000 mg | ORAL_TABLET | Freq: Two times a day (BID) | ORAL | Status: DC
  Administered 2020-12-30: 25 mg via ORAL
  Filled 2020-12-29 (×2): qty 2

## 2020-12-29 NOTE — ED Provider Notes (Addendum)
[facility] DEPT Provider Note   CSN: [id number] Arrival date & time: 12/29/20  2156     History No chief complaint on file.   Bernarda Erck Moger is a 55 y.o. female.  Patient with reported history of bipolar disorder brought in by police for running outside naked during the winter.  Unable to obtain additional history from the patient as she is screaming incessantly and uncooperative appears agitated and requiring physical and chemical sedation.        Past Medical History:  Diagnosis Date  . Bipolar affective disorder ([place])   . History of arthritis   . History of chicken pox   . History of depression   . History of genital warts   . history of heart murmur   . History of high blood pressure   . History of thyroid disease   . History of UTI   . Hypertension   . Low TSH level 07/13/2017  . Schizophrenia [facility])     Patient Active Problem List   Diagnosis Date Noted  . Schizoaffective disorder, bipolar type ([place]) 02/08/2020  . Acute metabolic encephalopathy 90/24/0973  . AMS (altered mental status) 11/17/2019  . Lithium toxicity 11/17/2019  . Hyperthyroidism 10/14/2019  . Medication side effect, initial encounter   . Schizoaffective disorder ([place]) 03/13/2019  . Severe mixed bipolar 1 disorder without psychosis ([place]) 03/13/2019  . Bipolar affective disorder, current episode mixed ([place]) 11/01/2018  . Toxic encephalopathy 07/14/2017  . Suicide attempt ([place]) 07/13/2017  . Low TSH level 07/13/2017  . Overdose of psychotropic 07/12/2017  . Valproic acid toxicity 07/12/2017  . Hyperammonemia ([place]) 07/12/2017  . Schizophrenia ([place]) 07/12/2017  . Bipolar disorder, most recent episode depressed ([place]) 07/12/2017  . Benign essential HTN 02/21/2017  . Hyperlipidemia, mixed 02/21/2017  . Insomnia 12/27/2015  . Abnormal urinalysis 12/27/2015  . 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 Severe manic bipolar 1 disorder with psychotic behavior ([place])  06/14/2015    Past Surgical History:  Procedure Laterality Date  . ABLATION ON ENDOMETRIOSIS    . CYST REMOVAL NECK     around 11 years ago /benign  . MULTIPLE TOOTH EXTRACTIONS       OB History   No obstetric history on file.     Family History  Problem Relation Age of Onset  . Arthritis Father   . Hyperlipidemia Father   . High blood pressure Father   . Diabetes Sister   . Diabetes Mother   . Diabetes Brother   . Mental illness Brother   . Alcohol abuse Paternal Uncle   . Alcohol abuse Paternal Grandfather   . Breast cancer Maternal Aunt   . Breast cancer Paternal Aunt   . High blood pressure Sister   . Mental illness Other        runs in family    Social History   Tobacco Use  . Smoking status: Never Smoker  . Smokeless tobacco: Never Used  Vaping Use  . Vaping Use: Never used  Substance Use Topics  . Alcohol use: No  . Drug use: No    Home Medications Prior to Admission medications   Medication Sig Start Date End Date Taking? Authorizing Provider  amLODipine (NORVASC) 10 MG tablet Take 1 tablet (10 mg total) by mouth daily. 11/13/20   Sharma Covert, MD  benztropine (COGENTIN) 1 MG tablet Take 1 tablet (1 mg total) by mouth 2 (two) times daily. 11/12/20   Sharma Covert, MD  carbamazepine (TEGRETOL) 100  MG chewable tablet Chew 1.5 tablets (150 mg total) by mouth 3 (three) times daily. 11/12/20   Sharma Covert, MD  carvedilol (COREG) 12.5 MG tablet Take 3 tablets (37.5 mg total) by mouth 2 (two) times daily with a meal. 11/12/20   Sharma Covert, MD  cloZAPine (CLOZARIL) 100 MG tablet TAKE 1 TABLET BY MOUTH EVERYDAY AT BEDTIME 11/12/20   Sharma Covert, MD  cloZAPine (CLOZARIL) 25 MG tablet Take 1 tablet (25 mg total) by mouth daily. 11/13/20   Sharma Covert, MD  hydrOXYzine (ATARAX/VISTARIL) 25 MG tablet Take 1 tablet (25 mg total) by mouth 3 (three) times daily as needed for anxiety. 11/12/20   Sharma Covert, MD  temazepam  (RESTORIL) 30 MG capsule Take 1 capsule (30 mg total) by mouth at bedtime. 11/12/20   Sharma Covert, MD    Allergies    Patient has no known allergies.  Review of Systems   Review of Systems  Unable to perform ROS: Psychiatric disorder    Physical Exam Updated Vital Signs BP (!) 169/95 (BP Location: Left Arm)   Pulse (!) 123   Temp 99.3 F (37.4 C) (Oral)   Resp 20   SpO2 98%   Physical Exam HENT:     Head: Normocephalic.     Nose: Nose normal.  Eyes:     Extraocular Movements: Extraocular movements intact.  Cardiovascular:     Rate and Rhythm: Normal rate.  Pulmonary:     Effort: Pulmonary effort is normal.  Musculoskeletal:        General: Normal range of motion.     Cervical back: Normal range of motion.  Neurological:     General: No focal deficit present.     Mental Status: She is alert. Mental status is at baseline.  Psychiatric:     Comments: Is screaming, appears in agitated delirium, not able to be redirected verbally.  Agitated and combative.     ED Results / Procedures / Treatments   Labs (all labs ordered are listed, but only abnormal results are displayed) Labs Reviewed  SARS CORONAVIRUS 2 BY RT PCR (HOSPITAL ORDER, [place])  CBC WITH DIFFERENTIAL/PLATELET  COMPREHENSIVE METABOLIC PANEL  RAPID URINE DRUG SCREEN, HOSP PERFORMED  ETHANOL  ACETAMINOPHEN LEVEL  SALICYLATE LEVEL    EKG None  Radiology No results found.  Procedures .Critical Care Performed by: Luna Fuse, MD Authorized by: Luna Fuse, MD   Critical care provider statement:    Critical care time (minutes):  30   Critical care time was exclusive of:  Separately billable procedures and treating other patients and teaching time Comments:     I was called immediately to the patient's bedside by nursing staff.  Patient arrived combative agitated with multiple security and police surrounding her and holding her down.  Concern for agitated  delirium requiring immediate attention chemical sedation, airway monitoring, physical restraints and multiple staff.     Medications Ordered in ED Medications  carbamazepine (TEGRETOL) chewable tablet 150 mg (has no administration in time range)  carvedilol (COREG) tablet 25 mg (has no administration in time range)  ziprasidone (GEODON) injection 20 mg (20 mg Intramuscular Given 12/29/20 2207)  LORazepam (ATIVAN) injection 2 mg (2 mg Intramuscular Given 12/29/20 2205)  diphenhydrAMINE (BENADRYL) injection 50 mg (50 mg Intramuscular Given 12/29/20 2207)  sterile water (preservative free) injection (2.1 mLs  Given 12/29/20 2207)    ED Course  I have reviewed the triage  vital signs and the nursing notes.  Pertinent labs & imaging results that were available during my care of the patient were reviewed by me and considered in my medical decision making (see chart for details).  Clinical Course as of 12/29/20 2251  Wed Dec 29, 2020  2250 Ethanol [JH]    Clinical Course User Index [JH] Almyra Free Greggory Brandy, MD   MDM Rules/Calculators/A&P                          Multiple security and nurses around the patient.  She is requiring chemical sedation and physical restraints.  Unable to obtain any meaningful history from the patient due to her condition.  Anticipate chemical sedation, psychiatric screening labs and TTS evaluation.  Patient has been placed on a IVC.   Final Clinical Impression(s) / ED Diagnoses Final diagnoses:  Combative behavior  Psychiatric illness    Rx / DC Orders ED Discharge Orders    None       Luna Fuse, MD 12/29/20 2208    Luna Fuse, MD 12/29/20 2216    Luna Fuse, MD 12/29/20 2226

## 2020-12-29 NOTE — ED Notes (Signed)
Called lab, states they are able to add on CK to blood sent

## 2020-12-29 NOTE — ED Notes (Signed)
Lab called blood is in lab for valproic level already test has been added.

## 2020-12-29 NOTE — ED Triage Notes (Signed)
Pt BIB [employer] while family members at [facility] obtaining IVC papers . Per [employer] officer pt has been non-complaint with her meds and behavior has worsened over that last few weeks

## 2020-12-30 LAB — URINALYSIS, ROUTINE W REFLEX MICROSCOPIC
Bilirubin Urine: NEGATIVE
Glucose, UA: NEGATIVE mg/dL
Ketones, ur: NEGATIVE mg/dL
Nitrite: NEGATIVE
Protein, ur: NEGATIVE mg/dL
Specific Gravity, Urine: 1.01 (ref 1.005–1.030)
pH: 7.5 (ref 5.0–8.0)

## 2020-12-30 LAB — RAPID URINE DRUG SCREEN, HOSP PERFORMED
Amphetamines: NOT DETECTED
Barbiturates: NOT DETECTED
Benzodiazepines: POSITIVE — AB
Cocaine: NOT DETECTED
Opiates: NOT DETECTED
Tetrahydrocannabinol: NOT DETECTED

## 2020-12-30 LAB — URINALYSIS, MICROSCOPIC (REFLEX): Squamous Epithelial / HPF: NONE SEEN (ref 0–5)

## 2020-12-30 LAB — SARS CORONAVIRUS 2 BY RT PCR (HOSPITAL ORDER, PERFORMED IN ~~LOC~~ HOSPITAL LAB): SARS Coronavirus 2: NEGATIVE

## 2020-12-30 MED ORDER — LORAZEPAM 1 MG PO TABS
1.0000 mg | ORAL_TABLET | Freq: Once | ORAL | Status: DC | PRN
Start: 2020-12-30 — End: 2020-12-31
  Filled 2020-12-30: qty 1

## 2020-12-30 MED ORDER — ZIPRASIDONE MESYLATE 20 MG IM SOLR: 10.0000 mg | Freq: Once | INTRAMUSCULAR | Status: DC

## 2020-12-30 MED ORDER — STERILE WATER FOR INJECTION IJ SOLN
INTRAMUSCULAR | Status: AC
  Filled 2020-12-30: qty 10

## 2020-12-30 MED ORDER — ZIPRASIDONE MESYLATE 20 MG IM SOLR
10.0000 mg | Freq: Once | INTRAMUSCULAR | Status: AC | PRN
Start: 2020-12-30 — End: 2020-12-30
  Administered 2020-12-30: 10 mg via INTRAMUSCULAR
  Filled 2020-12-30: qty 20

## 2020-12-30 MED ORDER — OLANZAPINE 5 MG PO TBDP
5.0000 mg | ORAL_TABLET | Freq: Once | ORAL | Status: AC
  Administered 2020-12-30: 5 mg via ORAL
  Filled 2020-12-30: qty 1

## 2020-12-30 MED ORDER — OLANZAPINE 10 MG IM SOLR: 5.0000 mg | Freq: Once | INTRAMUSCULAR | Status: DC | PRN

## 2020-12-30 MED ORDER — ZIPRASIDONE MESYLATE 20 MG IM SOLR
20.0000 mg | Freq: Once | INTRAMUSCULAR | Status: AC
  Administered 2020-12-30: 20 mg via INTRAMUSCULAR
  Filled 2020-12-30: qty 20

## 2020-12-30 MED ORDER — OLANZAPINE 5 MG PO TABS
5.0000 mg | ORAL_TABLET | Freq: Two times a day (BID) | ORAL | Status: DC
  Administered 2020-12-30: 5 mg via ORAL
  Filled 2020-12-30 (×2): qty 1

## 2020-12-30 NOTE — ED Notes (Signed)
Pt yelling at staff, able to redirect and de-escalate patient at this time. Gave patient a drink per request and assisted back to stretcher.

## 2020-12-30 NOTE — BH Assessment (Signed)
Disposition Paula Romp, NP, patient meets inpatient criteria. Maudie Mercury, AC, no appropriate beds. Disposition SW will secure placement in the AM.

## 2020-12-30 NOTE — ED Notes (Signed)
1 bag of belongings (including 2 blankets) secured in cabinet behind nurses station by room 18.

## 2020-12-30 NOTE — ED Notes (Signed)
Pt yelling in hallway at staff and other patients. Attempted multiple times to redirect patient and de-escalate but unsuccessful. Security called for assistance.

## 2020-12-30 NOTE — ED Notes (Signed)
Called about urine culture.  Lab is going to try and use what they have.

## 2020-12-30 NOTE — ED Notes (Signed)
Pt ambulated to bathroom with assistance. Pt slightly unsteady on feet upon standing, but steady ambulating after initial standing

## 2020-12-30 NOTE — ED Notes (Signed)
Pt's mood seems to be labile varying between agitated and tearful. Pt was resting comfortably this morning but upon waking pt is agitated due to family situation. Pt becomes tearful while talking. Speech is sometimes nonsensical and pt exhibits flight of ideas. Pt is redirectable with food and basic hygiene needs. Pt expressed this morning that she did not want to be admitted for "mental health"

## 2020-12-30 NOTE — ED Notes (Signed)
Pt sitting up in bed calling out to staff members, asking for the [facility]. Attempted to redirect patient, offered food/drink and blankets.

## 2020-12-30 NOTE — Progress Notes (Addendum)
Pt accepted to [facility] [address], [place], [place] [address]      Dr.  Raynelle Fanning is the attending provider.    Call report to [phone]    Vicente Males @ [facility] ED notified.     Pt is IVC.    Pt may be transported by [facility], [phone]  Pt scheduled  to arrive at [facility] after 9:00 am. Tomorrow (12/31/2020)  [facility] Disposition [phone] (cell)

## 2020-12-30 NOTE — BH Assessment (Addendum)
[facility] Assessment Progress Note  Per Ricky Ala, NP, this pt requires psychiatric hospitalization at this time.  Pt presents under IVC initiated by pt's sister and upheld by EDP Thamas Jaegers, MD.  The following facilities have been contacted to seek placement for this pt, with results as noted:  Beds available, information sent, decision pending: [facility]  Unable to reach: [facility] (left message at 14:43)  Rosana Hoes (left message at 15:36) Autumn Patty (phone rang, no pickup)  At capacity: [facility], [place] Coordinator [phone]

## 2020-12-30 NOTE — BH Assessment (Addendum)
Comprehensive Clinical Assessment (CCA) Note  12/30/2020 Paula Dickson [id number]   Paula Dickson is a 55 year old female that presents under IVC to [facility] due to bizarre behaviors of running outside naked during the winter. Patient admitted to walking around nude outside. Per [employer] officer, patient has been non-compliant with her medications and behaviors has worsened over that last few weeks. During TTS assessment patient had flight of ideas and continued to ramble during assessment. Patient denied SI, HI, psychosis and alcohol/drug usage. Unable to obtain information from patient due to mental status. Patient is currently being seen for medication management through [facility].   PER IVC Respondent suffers from schizophrenia and bipolar and is not taking her medication or sleeping. She has been hearing voices and becoming very violent she have become paranoid she has become a danger to all around her threating to kill her family members.   Collateral contact, TTS clinician received additional information from petitioner, sister Paula Dickson, [phone]. Per sister patient refused to attend [facility] medication managment appointment on yesterday. Patient then ran around outside in the nude. Patient was also cursing, crying and hitting things. Patient then accused sister of hindering her and that she can heal on her own and that she does not need any psych medications. Sister reported the bizarre behaviors in the past couple of days is the worst we have ever seen. Sister reported patient is not taking her psych medications. Sister also reported patient has been threatening to kill family members. TTS clinician shared no information with petitioner.   Disposition Lindon Romp, NP, patient meets inpatient criteria. Maudie Mercury, AC, no appropriate beds. Disposition SW will secure placement in the AM.   Chief Complaint: Bizarre behaviors  Visit Diagnosis: Bipolar disorder  CCA Biopsychosocial Intake/Chief  Complaint:  Bizarre behaviors  Current Symptoms/Problems: Patient running around outside in the nude.  Patient Reported Schizophrenia/Schizoaffective Diagnosis in Past: No data recorded  Strengths: uta  Preferences: uta  Abilities: uta  Type of Services Patient Feels are Needed: uta  Initial Clinical Notes/Concerns: uta  Mental Health Symptoms Depression:  -- [place]inc[place] Badder)   Duration of Depressive symptoms: No data recorded  Mania:  Racing thoughts   Anxiety:   -- [place] Badder)   Psychosis:  Delusions; Hallucinations   Duration of Psychotic symptoms: -- [place] Badder)   Trauma:  -- [place]inc[place] Badder)   Obsessions:  -- [place] Badder)   Compulsions:  -- [place] Badder)   Inattention:  -- [place] Badder)   Hyperactivity/Impulsivity:  -- [place] Badder)   Oppositional/Defiant Behaviors:  -- [place] Badder)   Emotional Irregularity:  -- [place] Badder)   Other Mood/Personality Symptoms:  No data recorded   Mental Status Exam Appearance and self-care  Stature:  Average   Weight:  Average weight   Clothing:  No data recorded  Grooming:  Normal   Cosmetic use:  Age appropriate   Posture/gait:  Normal   Motor activity:  Not Remarkable   Sensorium  Attention:  Normal   Concentration:  Scattered   Orientation:  -- Pincus Badder)   Recall/memory:  Defective in Recent   Affect and Mood  Affect:  Appropriate; Anxious   Mood:  Anxious   Relating  Eye contact:  Normal   Facial expression:  Sad   Attitude toward examiner:  Cooperative   Thought and Language  Speech flow: Flight of Ideas   Thought content:  -- [place]inc[place] Badder)   Preoccupation:  -- Pincus Badder)   Hallucinations:  Visual   Organization:  No data recorded  [employer] of Knowledge:  Poor  Intelligence:  -- Pincus Badder)   Abstraction:  No data recorded  Judgement:  Poor   Reality Testing:  -- Pincus Badder)   Insight:  Poor   Decision Making:  Impulsive   Social Functioning  Social Maturity:  No data recorded  Social Judgement:  No data recorded  Stress  Stressors:  -- (mental health)    Coping Ability:  Overwhelmed   Skill Deficits:  Decision making; Self-control   Supports:  Family    Religion:   Leisure/Recreation:   Exercise/Diet: Exercise/Diet Do You Have Any Trouble Sleeping?:  (uta)  CCA Employment/Education Employment/Work Situation: Employment / Work Situation Employment situation: On disability Why is patient on disability: uta How long has patient been on disability: uta Patient's job has been impacted by current illness: No What is the longest time patient has a held a job?: 9 years  Where was the patient employed at that time?: [employer]  Has patient ever been in the [employer]?: No  Education: Education Did Teacher, adult education From [employer]?:  (uta) Did You Attend College?:  (uta) Did Coats?:  Pincus Badder) Did You Have An Individualized Education Program (IIEP): No Did You Have Any Difficulty At School?: No Patient's Education Has Been Impacted by Current Illness: No  CCA Family/Childhood History Family and Relationship History: Family history What is your sexual orientation?: uta Has your sexual activity been affected by drugs, alcohol, medication, or emotional stress?: None reported  Does patient have children?: No  Childhood History:  Childhood History By whom was/is the patient raised?: Both parents Description of patient's relationship with caregiver when they were a child: Good relationship with parents  How were you disciplined when you got in trouble as a child/adolescent?: None reported  Does patient have siblings?:  (uta) Did patient suffer any verbal/emotional/physical/sexual abuse as a child?: No Did patient suffer from severe childhood neglect?:  (uta) Has patient ever been sexually abused/assaulted/raped as an adolescent or adult?: No Was the patient ever a victim of a crime or a disaster?:  (uta) Witnessed domestic violence?: No Has patient been affected by domestic violence as an adult?:  No  Child/Adolescent Assessment:   CCA Substance Use Alcohol/Drug Use: Alcohol / Drug Use Pain Medications: see MAR Prescriptions: see MAR Over the Counter: see MAR History of alcohol / drug use?: No history of alcohol / drug abuse   ASAM's:  Six Dimensions of Multidimensional Assessment  Dimension 1:  Acute Intoxication and/or Withdrawal Potential:      Dimension 2:  Biomedical Conditions and Complications:      Dimension 3:  Emotional, Behavioral, or Cognitive Conditions and Complications:     Dimension 4:  Readiness to Change:     Dimension 5:  Relapse, Continued use, or Continued Problem Potential:     Dimension 6:  Recovery/Living Environment:     ASAM Severity Score:    ASAM Recommended Level of Treatment:     Substance use Disorder (SUD)   Recommendations for Services/Supports/Treatments:   DSM5 Diagnoses: Patient Active Problem List   Diagnosis Date Noted  . Schizoaffective disorder, bipolar type ([place]) 02/08/2020  . Acute metabolic encephalopathy [id number]  . AMS (altered mental status) 11/17/2019  . Lithium toxicity 11/17/2019  . Hyperthyroidism 10/14/2019  . Medication side effect, initial encounter   . Schizoaffective disorder ([place]) 03/13/2019  . Severe mixed bipolar 1 disorder without psychosis ([place]) 03/13/2019  . Bipolar affective disorder, current episode mixed ([place]) 11/01/2018  . Toxic encephalopathy 07/14/2017  . Suicide attempt ([place]) 07/13/2017  .  Low TSH level 07/13/2017  . Overdose of psychotropic 07/12/2017  . Valproic acid toxicity 07/12/2017  . Hyperammonemia ([place]) 07/12/2017  . Schizophrenia ([place]) 07/12/2017  . Bipolar disorder, most recent episode depressed ([place]) 07/12/2017  . Benign essential HTN 02/21/2017  . Hyperlipidemia, mixed 02/21/2017  . Insomnia 12/27/2015  . Abnormal urinalysis 12/27/2015  . 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 Severe manic bipolar 1 disorder with psychotic behavior ([place]) 06/14/2015   Patient Centered  Plan: Patient is on the following Treatment Plan(s):    Referrals to Alternative Service(s): Referred to Alternative Service(s):   Place:   Date:   Time:    Referred to Alternative Service(s):   Place:   Date:   Time:    Referred to Alternative Service(s):   Place:   Date:   Time:    Referred to Alternative Service(s):   Place:   Date:   Time:     Venora Maples, [facility]

## 2020-12-30 NOTE — ED Provider Notes (Addendum)
Emergency Medicine Observation Re-evaluation Note  Paula Dickson is a 55 y.o. female, seen on rounds today.  Pt initially presented to the ED for complaints of No chief complaint on file. Currently, the patient is awaiting inpatient psych placement.  Physical Exam  BP 117/75   Pulse 86   Temp 98 F (36.7 C) (Oral)   Resp 14   SpO2 96%  Physical Exam General: Calm  Cardiac: Well perfused  Lungs: Even, unlabored respirations  Psych: Calm  ED Course / MDM  EKG:EKG Interpretation  Date/Time:  Wednesday December 29 2020 22:02:02 EST Ventricular Rate:  129 PR Interval:    QRS Duration: 86 QT Interval:  278 QTC Calculation: 408 R Axis:   3 Text Interpretation: Sinus tachycardia Low voltage, precordial leads LVH with secondary repolarization abnormality Confirmed by Thamas Jaegers ([id number]) on 12/29/2020 10:51:05 PM  Clinical Course as of 12/30/20 0819  Wed Dec 29, 2020  2250 Ethanol [JH]    Clinical Course User Index [JH] Luna Fuse, MD   I have reviewed the labs performed to date as well as medications administered while in observation.  Recent changes in the last 24 hours include patient recommended for inpatient psych.  01:22 PM  Patient is intermittently tearful and then becomes agitated but not physically aggressive or violent towards staff.  She has multiple home medications but these are listed as that she is not taking them.  I reviewed the patient's EKG which shows normal QTC.  We will try Zyprexa PO/IM here and reassess.  Patient also not providing a urine sample.  She has been here for 15 hours.  Will bladder scan but unknown if patient has actually not urinated since arrival or not. Nursing to follow.   UA obtained and equivocal. Will hold on abx and send for culture to guide treatment if necessary.   Plan  Current plan is for inpatient psych placement. Patient is under full IVC at this time.   Margette Fast, MD 12/31/20 0740    Margette Fast,  MD 12/31/20 [phone]

## 2020-12-30 NOTE — ED Notes (Signed)
Patient is agitated, crying and yelling in the hallway, nonsensical things about a church and her brother. States she is a prisoner at this time.

## 2020-12-31 NOTE — ED Provider Notes (Incomplete Revision)
Emergency Medicine Observation Re-evaluation Note  Paula Dickson is a 55 y.o. female, seen on rounds today.  Pt initially presented to the ED for complaints of No chief complaint on file. Currently, the patient is awaiting inpatient psych placement.  Physical Exam  BP 117/71 (BP Location: Left Arm)   Pulse 66   Temp 98.2 F (36.8 C) (Oral)   Resp 18   SpO2 98%  Physical Exam General: In no distress Cardiac: Well-perfused Lungs: Unlabored respirations Psych: Somewhat angry at times  ED Course / MDM  EKG:EKG Interpretation  Date/Time:  Wednesday December 29 2020 22:02:02 EST Ventricular Rate:  129 PR Interval:    QRS Duration: 86 QT Interval:  278 QTC Calculation: 408 R Axis:   3 Text Interpretation: Sinus tachycardia Low voltage, precordial leads LVH with secondary repolarization abnormality Interpretation limited secondary to artifact Confirmed by Ripley Fraise [phone]) on 12/30/2020 9:05:35 AM  Clinical Course as of 12/31/20 0800  Wed Dec 29, 2020  2250 Ethanol [JH]    Clinical Course User Index [JH] Luna Fuse, MD   I have reviewed the labs performed to date as well as medications administered while in observation.  Recent changes in the last 24 hours include psychiatry bed search.  Plan  Current plan is for inpatient psychiatry. Patient is under full IVC at this time.   Paula Rasmussen, MD 12/31/20 0801  8:20 AM. Received a call from Paula Dickson from behavioral health that patient has been accepted to [facility] by Dr. Wayne Dickson. Patient can be transported after 9 AM. I updated charge nurse.

## 2020-12-31 NOTE — ED Notes (Signed)
EMTALA documentation, transfer notes, face sheet and IVC paperwork provided to sheriff deputy for transport to [facility] under [employer]

## 2020-12-31 NOTE — ED Provider Notes (Addendum)
Emergency Medicine Observation Re-evaluation Note  Paula Dickson is a 55 y.o. female, seen on rounds today.  Pt initially presented to the ED for complaints of No chief complaint on file. Currently, the patient is awaiting inpatient psych placement.  Physical Exam  BP 117/71 (BP Location: Left Arm)   Pulse 66   Temp 98.2 F (36.8 C) (Oral)   Resp 18   SpO2 98%  Physical Exam General: In no distress Cardiac: Well-perfused Lungs: Unlabored respirations Psych: Somewhat angry at times  ED Course / MDM  EKG:EKG Interpretation  Date/Time:  Wednesday December 29 2020 22:02:02 EST Ventricular Rate:  129 PR Interval:    QRS Duration: 86 QT Interval:  278 QTC Calculation: 408 R Axis:   3 Text Interpretation: Sinus tachycardia Low voltage, precordial leads LVH with secondary repolarization abnormality Interpretation limited secondary to artifact Confirmed by Ripley Fraise [id number]) on 12/30/2020 9:05:35 AM  Clinical Course as of 12/31/20 0800  Wed Dec 29, 2020  2250 Ethanol [JH]    Clinical Course User Index [JH] Luna Fuse, MD   I have reviewed the labs performed to date as well as medications administered while in observation.  Recent changes in the last 24 hours include psychiatry bed search.  Plan  Current plan is for inpatient psychiatry. Patient is under full IVC at this time.   [name], MD 12/31/20 0801  8:20 AM. Received a call from [name] from behavioral health that patient has been accepted to [facility] by Dr. Wayne Sever. Patient can be transported after 9 AM. I updated charge nurse.    [name], MD 01/01/21 1128

## 2020-12-31 NOTE — ED Notes (Signed)
Sheriff Deputy at bedside to transport pt. Pt is still currently IVC'ed. Pt refusing transport. Pt informed by multiple staff and LEO that she Is still under IVC and must be transported to [facility]. Multiple attempts made to allow pt to voluntarily go. [facility] Ambulance person and back up as pt remains uncooperative. Pt was escorted to [employer] via Biomedical scientist and in forensic restraints. Pt fighting and aggressive with LEOs . Pt stable, alert and yelling upon escort from ED.

## 2020-12-31 NOTE — ED Notes (Signed)
Report provided to Judithann Sauger RN at [facility]

## 2021-01-01 LAB — URINE CULTURE

## 2021-01-18 ENCOUNTER — Inpatient Hospital Stay: Admission: RE | Admit: 2021-01-18 | Payer: 59 | Source: Ambulatory Visit

## 2021-02-03 ENCOUNTER — Emergency Department (HOSPITAL_COMMUNITY): Payer: 59

## 2021-02-03 ENCOUNTER — Inpatient Hospital Stay (HOSPITAL_COMMUNITY)
Admission: EM | Admit: 2021-02-03 | Discharge: 2021-02-20 | DRG: 683 | Disposition: A | Discharge status: Home / Self Care | Payer: 59 | Attending: Internal Medicine | Admitting: Internal Medicine

## 2021-02-03 ENCOUNTER — Other Ambulatory Visit: Payer: Self-pay

## 2021-02-03 ENCOUNTER — Encounter (HOSPITAL_COMMUNITY): Payer: Self-pay

## 2021-02-03 DIAGNOSIS — R7989 Other specified abnormal findings of blood chemistry: Secondary | ICD-10-CM | POA: Diagnosis present

## 2021-02-03 DIAGNOSIS — Z83438 Family history of other disorder of lipoprotein metabolism and other lipidemia: Secondary | ICD-10-CM

## 2021-02-03 DIAGNOSIS — E876 Hypokalemia: Secondary | ICD-10-CM | POA: Diagnosis present

## 2021-02-03 DIAGNOSIS — R000 Tachycardia, unspecified: Secondary | ICD-10-CM | POA: Diagnosis present

## 2021-02-03 DIAGNOSIS — E860 Dehydration: Secondary | ICD-10-CM | POA: Diagnosis present

## 2021-02-03 DIAGNOSIS — D72829 Elevated white blood cell count, unspecified: Secondary | ICD-10-CM | POA: Diagnosis not present

## 2021-02-03 DIAGNOSIS — Z9114 Patient's other noncompliance with medication regimen: Secondary | ICD-10-CM | POA: Diagnosis not present

## 2021-02-03 DIAGNOSIS — Z20822 Contact with and (suspected) exposure to covid-19: Secondary | ICD-10-CM | POA: Diagnosis present

## 2021-02-03 DIAGNOSIS — E861 Hypovolemia: Secondary | ICD-10-CM | POA: Diagnosis present

## 2021-02-03 DIAGNOSIS — N179 Acute kidney failure, unspecified: Secondary | ICD-10-CM | POA: Diagnosis present

## 2021-02-03 DIAGNOSIS — E785 Hyperlipidemia, unspecified: Secondary | ICD-10-CM | POA: Diagnosis present

## 2021-02-03 DIAGNOSIS — E042 Nontoxic multinodular goiter: Secondary | ICD-10-CM | POA: Diagnosis present

## 2021-02-03 DIAGNOSIS — Z818 Family history of other mental and behavioral disorders: Secondary | ICD-10-CM | POA: Diagnosis not present

## 2021-02-03 DIAGNOSIS — E872 Acidosis: Secondary | ICD-10-CM | POA: Diagnosis present

## 2021-02-03 DIAGNOSIS — E039 Hypothyroidism, unspecified: Secondary | ICD-10-CM | POA: Diagnosis present

## 2021-02-03 DIAGNOSIS — F200 Paranoid schizophrenia: Secondary | ICD-10-CM | POA: Diagnosis present

## 2021-02-03 DIAGNOSIS — F3130 Bipolar disorder, current episode depressed, mild or moderate severity, unspecified: Secondary | ICD-10-CM | POA: Diagnosis present

## 2021-02-03 DIAGNOSIS — F209 Schizophrenia, unspecified: Secondary | ICD-10-CM | POA: Diagnosis present

## 2021-02-03 DIAGNOSIS — E0590 Thyrotoxicosis, unspecified without thyrotoxic crisis or storm: Secondary | ICD-10-CM | POA: Diagnosis not present

## 2021-02-03 DIAGNOSIS — Z87440 Personal history of urinary (tract) infections: Secondary | ICD-10-CM | POA: Diagnosis not present

## 2021-02-03 DIAGNOSIS — I10 Essential (primary) hypertension: Secondary | ICD-10-CM | POA: Diagnosis present

## 2021-02-03 DIAGNOSIS — R627 Adult failure to thrive: Secondary | ICD-10-CM | POA: Diagnosis present

## 2021-02-03 DIAGNOSIS — E870 Hyperosmolality and hypernatremia: Secondary | ICD-10-CM | POA: Diagnosis present

## 2021-02-03 DIAGNOSIS — Z79899 Other long term (current) drug therapy: Secondary | ICD-10-CM

## 2021-02-03 DIAGNOSIS — I480 Paroxysmal atrial fibrillation: Secondary | ICD-10-CM

## 2021-02-03 DIAGNOSIS — I4891 Unspecified atrial fibrillation: Secondary | ICD-10-CM | POA: Diagnosis not present

## 2021-02-03 DIAGNOSIS — D696 Thrombocytopenia, unspecified: Secondary | ICD-10-CM | POA: Diagnosis present

## 2021-02-03 DIAGNOSIS — N19 Unspecified kidney failure: Secondary | ICD-10-CM

## 2021-02-03 LAB — CBC WITH DIFFERENTIAL/PLATELET
Abs Immature Granulocytes: 0.03 10*3/uL (ref 0.00–0.07)
Basophils Absolute: 0.1 10*3/uL (ref 0.0–0.1)
Basophils Relative: 1 %
Eosinophils Absolute: 0 10*3/uL (ref 0.0–0.5)
Eosinophils Relative: 0 %
HCT: 54.9 % — ABNORMAL HIGH (ref 36.0–46.0)
Hemoglobin: 16.9 g/dL — ABNORMAL HIGH (ref 12.0–15.0)
Immature Granulocytes: 0 %
Lymphocytes Relative: 15 %
Lymphs Abs: 1.6 10*3/uL (ref 0.7–4.0)
MCH: 28.3 pg (ref 26.0–34.0)
MCHC: 30.8 g/dL (ref 30.0–36.0)
MCV: 91.8 fL (ref 80.0–100.0)
Monocytes Absolute: 1.1 10*3/uL — ABNORMAL HIGH (ref 0.1–1.0)
Monocytes Relative: 10 %
Neutro Abs: 8.1 10*3/uL — ABNORMAL HIGH (ref 1.7–7.7)
Neutrophils Relative %: 74 %
Platelets: 207 10*3/uL (ref 150–400)
RBC: 5.98 MIL/uL — ABNORMAL HIGH (ref 3.87–5.11)
RDW: 13.7 % (ref 11.5–15.5)
WBC: 11 10*3/uL — ABNORMAL HIGH (ref 4.0–10.5)
nRBC: 0 % (ref 0.0–0.2)

## 2021-02-03 LAB — CREATININE, SERUM
Creatinine, Ser: 2.5 mg/dL — ABNORMAL HIGH (ref 0.44–1.00)
GFR, Estimated: 22 mL/min — ABNORMAL LOW (ref >60)

## 2021-02-03 LAB — COMPREHENSIVE METABOLIC PANEL
ALT: 12 U/L (ref 0–44)
AST: 11 U/L — ABNORMAL LOW (ref 15–41)
Albumin: 4.2 g/dL (ref 3.5–5.0)
Alkaline Phosphatase: 83 U/L (ref 38–126)
Anion gap: 20 — ABNORMAL HIGH (ref 5–15)
BUN: 97 mg/dL — ABNORMAL HIGH (ref 6–20)
CO2: 19 mmol/L — ABNORMAL LOW (ref 22–32)
Calcium: 10.6 mg/dL — ABNORMAL HIGH (ref 8.9–10.3)
Chloride: 128 mmol/L — ABNORMAL HIGH (ref 98–111)
Creatinine, Ser: 3.38 mg/dL — ABNORMAL HIGH (ref 0.44–1.00)
GFR, Estimated: 15 mL/min — ABNORMAL LOW (ref >60)
Glucose, Bld: 144 mg/dL — ABNORMAL HIGH (ref 70–99)
Potassium: 4.6 mmol/L (ref 3.5–5.1)
Sodium: 167 mmol/L (ref 135–145)
Total Bilirubin: 1.1 mg/dL (ref 0.3–1.2)
Total Protein: 7.4 g/dL (ref 6.5–8.1)

## 2021-02-03 LAB — CBC
HCT: 50.7 % — ABNORMAL HIGH (ref 36.0–46.0)
Hemoglobin: 15.6 g/dL — ABNORMAL HIGH (ref 12.0–15.0)
MCH: 28.4 pg (ref 26.0–34.0)
MCHC: 30.8 g/dL (ref 30.0–36.0)
MCV: 92.2 fL (ref 80.0–100.0)
Platelets: 178 10*3/uL (ref 150–400)
RBC: 5.5 MIL/uL — ABNORMAL HIGH (ref 3.87–5.11)
RDW: 13.7 % (ref 11.5–15.5)
WBC: 12.9 10*3/uL — ABNORMAL HIGH (ref 4.0–10.5)
nRBC: 0 % (ref 0.0–0.2)

## 2021-02-03 LAB — TROPONIN I (HIGH SENSITIVITY): Troponin I (High Sensitivity): 37 ng/L — ABNORMAL HIGH (ref <18)

## 2021-02-03 LAB — TSH: TSH: <0.01 u[IU]/mL — ABNORMAL LOW (ref 0.350–4.500)

## 2021-02-03 LAB — APTT: aPTT: 28 seconds (ref 24–36)

## 2021-02-03 LAB — LACTIC ACID, PLASMA
Lactic Acid, Venous: 2.7 mmol/L (ref 0.5–1.9)
Lactic Acid, Venous: 2.7 mmol/L (ref 0.5–1.9)

## 2021-02-03 LAB — PROTIME-INR
INR: 1.2 (ref 0.8–1.2)
Prothrombin Time: 14.7 seconds (ref 11.4–15.2)

## 2021-02-03 MED ORDER — ENSURE ENLIVE PO LIQD
237.0000 mL | Freq: Two times a day (BID) | ORAL | Status: DC
  Administered 2021-02-06 – 2021-02-07 (×4): 237 mL via ORAL

## 2021-02-03 MED ORDER — LACTATED RINGERS IV BOLUS
2000.0000 mL | Freq: Once | INTRAVENOUS | Status: AC
  Administered 2021-02-03: 2000 mL via INTRAVENOUS

## 2021-02-03 MED ORDER — ACETAMINOPHEN 650 MG RE SUPP: 650.0000 mg | Freq: Four times a day (QID) | RECTAL | Status: DC | PRN

## 2021-02-03 MED ORDER — ACETAMINOPHEN 325 MG PO TABS
650.0000 mg | ORAL_TABLET | Freq: Four times a day (QID) | ORAL | Status: DC | PRN
  Filled 2021-02-03: qty 2

## 2021-02-03 MED ORDER — LACTATED RINGERS IV SOLN: INTRAVENOUS | Status: DC

## 2021-02-03 MED ORDER — KCL IN DEXTROSE-NACL 20-5-0.45 MEQ/L-%-% IV SOLN
Freq: Once | INTRAVENOUS | Status: DC
  Filled 2021-02-03: qty 1000

## 2021-02-03 MED ORDER — DILTIAZEM HCL-DEXTROSE 125-5 MG/125ML-% IV SOLN (PREMIX)
5.0000 mg/h | INTRAVENOUS | Status: DC
  Administered 2021-02-03: 5 mg/h via INTRAVENOUS
  Administered 2021-02-03 – 2021-02-04 (×2): 7.5 mg/h via INTRAVENOUS
  Filled 2021-02-03 (×3): qty 125

## 2021-02-03 MED ORDER — DEXTROSE 5 % IV SOLN: INTRAVENOUS | Status: AC

## 2021-02-03 MED ORDER — DILTIAZEM HCL 25 MG/5ML IV SOLN
10.0000 mg | Freq: Once | INTRAVENOUS | Status: AC
  Administered 2021-02-03: 10 mg via INTRAVENOUS
  Filled 2021-02-03: qty 5

## 2021-02-03 MED ORDER — HEPARIN SODIUM (PORCINE) 5000 UNIT/ML IJ SOLN
5000.0000 [IU] | Freq: Three times a day (TID) | INTRAMUSCULAR | Status: DC
  Administered 2021-02-03: 5000 [IU] via SUBCUTANEOUS
  Filled 2021-02-03: qty 1

## 2021-02-03 NOTE — ED Notes (Addendum)
Dr. Carlyle Basques aware of lac acid 2.7 and Na+ 167

## 2021-02-03 NOTE — ED Notes (Signed)
ED TO INPATIENT HANDOFF REPORT  Name/Age/Gender Paula Dickson 55 y.o. female  Code Status Code Status History    Date Active Date Inactive Code Status Order ID Comments User Context   10/26/2020 1303 11/12/2020 2131 Full Code [id number]  Rozetta Nunnery, NP Inpatient   10/25/2020 2346 10/26/2020 1229 Full Code [id number]  Sidney Ace ED   02/09/2020 1505 02/12/2020 2333 Full Code [id number]  RankinMercy Moore, NP Inpatient   02/08/2020 0156 02/09/2020 1505 Full Code [id number]  Rozetta Nunnery, NP Inpatient   01/06/2020 1742 01/07/2020 1647 Full Code [id number]  Charlesetta Shanks, MD ED   12/23/2019 1750 01/01/2020 0025 Full Code [id number]  Consuello Closs, NP Inpatient   12/22/2019 1323 12/23/2019 1715 Full Code [id number]  Margarita Mail, PA-C ED   11/17/2019 1731 11/28/2019 0420 Full Code [id number]  Lequita Halt, MD ED   10/14/2019 1018 10/16/2019 1833 Full Code [id number]  Truddie Hidden, MD Inpatient   10/11/2019 1719 10/14/2019 1018 Full Code [id number]  Virgel Manifold, MD ED   08/26/2019 1114 08/26/2019 2246 Full Code [id number]  Deno Etienne, DO ED   07/30/2019 1911 07/31/2019 2102 Full Code [id number]  Callahan, Glynda Jaeger, PA-C ED   03/13/2019 1652 03/14/2019 0213 Full Code [id number]  Ethelene Hal, NP Inpatient   03/13/2019 1614 03/13/2019 1652 Full Code [id number]  Johnn Hai, MD Inpatient   11/01/2018 1948 11/18/2018 2156 Full Code [id number]  Ethelene Hal, NP Inpatient   10/31/2018 1804 11/01/2018 1704 Full Code [id number]  Charlesetta Shanks, MD ED   07/12/2017 0819 07/17/2017 1424 Full Code [id number]  Reubin Milan, MD Inpatient   03/25/2017 0215 03/30/2017 1706 Full Code [id number]  Dalia Heading, PA-C ED   12/09/2015 2231 12/20/2015 0958 Full Code [id number]  Clovis Fredrickson, MD Inpatient   11/30/2015 1514 12/09/2015 2231 Full Code [id number]  Roney Jaffe, MD ED   11/30/2015 1401 11/30/2015 1514 Full Code [id number]  Orlie Dakin, MD ED   07/03/2015 1906 07/05/2015 2204  Full Code [id number]  Dalia Heading, PA-C ED   06/13/2015 2228 06/16/2015 1622 Full Code [id number]  [name], MD Inpatient   06/13/2015 1948 06/13/2015 2228 Full Code [id number]  Rebeca Allegra ED   Advance Care Planning Activity    Questions for Most Recent Historical Code Status (Order [id number])       Home/SNF/Other Home  Chief Complaint Hypernatremia [E87.0]  Level of Care/Admitting Diagnosis ED Disposition    ED Disposition Condition [place]: [place] [100102]  Level of Care: Progressive [102]  Admit to Progressive based on following criteria: MULTISYSTEM THREATS such as stable sepsis, metabolic/electrolyte imbalance with or without encephalopathy that is responding to early treatment.  May admit patient to [facility] or [facility] if equivalent level of care is available:: No  Covid Evaluation: Asymptomatic Screening Protocol (No Symptoms)  Diagnosis: Hypernatremia [119417]  Admitting Physician: [name] [phone]  Attending Physician: [name] [phone]  Estimated length of stay: past midnight tomorrow  Certification:: I certify this patient will need inpatient services for at least 2 midnights       Medical History Past Medical History:  Diagnosis Date  . Bipolar affective disorder ([place])   . History of arthritis   . History of chicken pox   . History of depression   . History of genital warts   . history of heart murmur   . History of high blood pressure   .  History of thyroid disease   . History of UTI   . Hypertension   . Low TSH level 07/13/2017  . Schizophrenia ([place])     Allergies No Known Allergies  IV Location/Drains/Wounds Patient Lines/Drains/Airways Status    Active Line/Drains/Airways    Name Placement date Placement time Site Days   Peripheral IV 02/03/21 Right Antecubital 02/03/21  1708  Antecubital  less than 1          Labs/Imaging Results for  orders placed or performed during the hospital encounter of 02/03/21 (from the past 48 hour(s))  Lactic acid, plasma     Status: Abnormal   Collection Time: 02/03/21  5:57 PM  Result Value Ref Range   Lactic Acid, Venous 2.7 (HH) 0.5 - 1.9 mmol/L    Comment: CRITICAL RESULT CALLED TO, READ BACK BY AND VERIFIED WITH: BROOKS,B. RN @1851  ON 03.10.2022 BY COHEN,K Performed at [facility], [place] [address], [place], [place] [address]   Comprehensive metabolic panel     Status: Abnormal   Collection Time: 02/03/21  5:57 PM  Result Value Ref Range   Sodium 167 (HH) 135 - 145 mmol/L    Comment: CRITICAL RESULT CALLED TO, READ BACK BY AND VERIFIED WITH: BROOKS,B. RN @1851  ON 03.10.2022 BY COHEN,K    Potassium 4.6 3.5 - 5.1 mmol/L   Chloride 128 (H) 98 - 111 mmol/L   CO2 19 (L) 22 - 32 mmol/L   Glucose, Bld 144 (H) 70 - 99 mg/dL    Comment: Glucose reference range applies only to samples taken after fasting for at least 8 hours.   BUN 97 (H) 6 - 20 mg/dL    Comment: RESULTS CONFIRMED BY MANUAL DILUTION   Creatinine, Ser 3.38 (H) 0.44 - 1.00 mg/dL   Calcium 10.6 (H) 8.9 - 10.3 mg/dL   Total Protein 7.4 6.5 - 8.1 g/dL   Albumin 4.2 3.5 - 5.0 g/dL   AST 11 (L) 15 - 41 U/L   ALT 12 0 - 44 U/L   Alkaline Phosphatase 83 38 - 126 U/L   Total Bilirubin 1.1 0.3 - 1.2 mg/dL   GFR, Estimated 15 (L) >60 mL/min    Comment: (NOTE) Calculated using the CKD-EPI Creatinine Equation (2021)    Anion gap 20 (H) 5 - 15    Comment: Performed at [facility], [place] [address], [place], [place] [address]  CBC WITH DIFFERENTIAL     Status: Abnormal   Collection Time: 02/03/21  5:57 PM  Result Value Ref Range   WBC 11.0 (H) 4.0 - 10.5 K/uL   RBC 5.98 (H) 3.87 - 5.11 MIL/uL   Hemoglobin 16.9 (H) 12.0 - 15.0 g/dL   HCT 54.9 (H) 36.0 - 46.0 %   MCV 91.8 80.0 - 100.0 fL   MCH 28.3 26.0 - 34.0 pg   MCHC 30.8 30.0 - 36.0 g/dL   RDW 13.7 11.5 - 15.5 %   Platelets 207 150 - 400  K/uL   nRBC 0.0 0.0 - 0.2 %   Neutrophils Relative % 74 %   Neutro Abs 8.1 (H) 1.7 - 7.7 K/uL   Lymphocytes Relative 15 %   Lymphs Abs 1.6 0.7 - 4.0 K/uL   Monocytes Relative 10 %   Monocytes Absolute 1.1 (H) 0.1 - 1.0 K/uL   Eosinophils Relative 0 %   Eosinophils Absolute 0.0 0.0 - 0.5 K/uL   Basophils Relative 1 %   Basophils Absolute 0.1 0.0 - 0.1 K/uL  Immature Granulocytes 0 %   Abs Immature Granulocytes 0.03 0.00 - 0.07 K/uL    Comment: Performed at [facility], [place] [address] [place], [place] [address]  Protime-INR     Status: None   Collection Time: 02/03/21  5:57 PM  Result Value Ref Range   Prothrombin Time 14.7 11.4 - 15.2 seconds   INR 1.2 0.8 - 1.2    Comment: (NOTE) INR goal varies based on device and disease states. Performed at [facility], [place] [address] [place], [place] [address]   APTT     Status: None   Collection Time: 02/03/21  5:57 PM  Result Value Ref Range   aPTT 28 24 - 36 seconds    Comment: Performed at [facility], [place] [address], [place], [place] [address]   DG Chest Port 1 View  Result Date: 02/03/2021 CLINICAL DATA:  Possible sepsis and failure to thrive EXAM: PORTABLE CHEST 1 VIEW COMPARISON:  11/11/2019 FINDINGS: Cardiac shadow is within normal limits. The lungs are clear bilaterally. No bony abnormality is seen. IMPRESSION: No active disease. Electronically Signed   By: Inez Catalina M.D.   On: 02/03/2021 18:48    Pending Labs Unresulted Labs (From admission, onward)          Start     Ordered   02/03/21 1857  SARS CORONAVIRUS 2 (TAT 6-24 HRS) Nasopharyngeal Nasopharyngeal Swab  (Tier 3 - Symptomatic/asymptomatic with Precautions)  Once,   STAT       Question Answer Comment  Is this test for diagnosis or screening Screening   Symptomatic for COVID-19 as defined by CDC No   Hospitalized for COVID-19 No   Admitted to ICU for COVID-19 No   Previously tested for COVID-19 No    Resident in a congregate (group) care setting No   Employed in healthcare setting No   Pregnant No   Has patient completed COVID vaccination(s) (2 doses of Pfizer/Moderna 1 dose of [employer]) Unknown      02/03/21 1856   02/03/21 1748  Lactic acid, plasma  (Undifferentiated presentation (screening labs and basic nursing orders))  Now then every 2 hours,   STAT      02/03/21 1747   02/03/21 1748  Urinalysis, Routine w reflex microscopic  (Undifferentiated presentation (screening labs and basic nursing orders))  ONCE - STAT,   STAT        02/03/21 1747   02/03/21 1748  Urine culture  (Undifferentiated presentation (screening labs and basic nursing orders))  ONCE - STAT,   STAT        02/03/21 1747          Vitals/Pain Today's Vitals   02/03/21 1719 02/03/21 1800 02/03/21 1845 02/03/21 1930  BP: 127/80 (!) 132/93 (!) 145/97 (!) 130/93  Pulse: (!) 130 (!) 128 (!) 121 (!) 124  Resp: 15 15 (!) 21 17  Temp:    98.4 F (36.9 C)  TempSrc:    Axillary  SpO2: 97% 97% 97% 97%  Weight:      Height:      PainSc:        Isolation Precautions Airborne and Contact precautions  Medications Medications  dextrose 5 % and 0.45 % NaCl with KCl 20 mEq/L infusion (has no administration in time range)  lactated ringers bolus 2,000 mL (2,000 mLs Intravenous New Bag/Given 02/03/21 1804)    Mobility {Mobility:[id number]

## 2021-02-03 NOTE — ED Notes (Signed)
Belongings removed and placed in cabinet at nurses station

## 2021-02-03 NOTE — ED Notes (Signed)
Bed alarm placed.  

## 2021-02-03 NOTE — ED Triage Notes (Signed)
BIBA Per EMS: Pt coming form home with complaints of failure to thrive. Pt has not been eating/drinking x2 weeks. Pt is bipolar and has a significant psych hx. Pt HR upon EMS arrival 150; BP 80/50. 650 NS given en route. BP now 128/82; HR 130; 22 RR; 100% RA. Pt very lethargic. Pt family has an IVC order.

## 2021-02-03 NOTE — ED Provider Notes (Signed)
[facility] DEPT Provider Note   CSN: [id number] Arrival date & time: 02/03/21  1658     History Chief Complaint  Patient presents with  . Failure to thrive    Paula Dickson is a 55 y.o. female.  HPI     55 year old female with history of bipolar disorder, UTI, schizophrenia comes in a chief complaint of failure to thrive.  Level 5 caveat for altered mental status -possibly due to underlying psych condition.  According to EMS, family involuntarily committed the patient because she was refusing to eat or drink for the last several days and being hostile towards them.  I spoke with patient's sister over the phone.  She states that patient was discharged from [facility] at the end of last month after psychiatric admission.  Patient has not been herself since then.  Patient has not been eating or drinking.  When family makes food for her, she is paranoid, thinking that they are trying to poison her.  Patient lives by herself and at her baseline she normally cooks for herself but she has not been doing that either.  They are on think patient is taking her medications.  Patient has been verbally aggressive towards them and has also shelved family members were trying to help.  Patient denies any SI, HI.  She denies any hallucinations.  She is not answering any questions beyond yes or no.  She appears dry.  Past Medical History:  Diagnosis Date  . Bipolar affective disorder ([place])   . History of arthritis   . History of chicken pox   . History of depression   . History of genital warts   . history of heart murmur   . History of high blood pressure   . History of thyroid disease   . History of UTI   . Hypertension   . Low TSH level 07/13/2017  . Schizophrenia [facility])     Patient Active Problem List   Diagnosis Date Noted  . Hypernatremia 02/03/2021  . Schizoaffective disorder, bipolar type ([place]) 02/08/2020  . Acute metabolic encephalopathy  34/28/7681  . AMS (altered mental status) 11/17/2019  . Lithium toxicity 11/17/2019  . Hyperthyroidism 10/14/2019  . Medication side effect, initial encounter   . Schizoaffective disorder ([place]) 03/13/2019  . 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 Bipolar affective disorder, current episode mixed ([place]) 11/01/2018  . Toxic encephalopathy 07/14/2017  . Suicide attempt ([place]) 07/13/2017  . Low TSH level 07/13/2017  . Overdose of psychotropic 07/12/2017  . Valproic acid toxicity 07/12/2017  . Hyperammonemia ([place]) 07/12/2017  . Schizophrenia ([place]) 07/12/2017  . Bipolar disorder, most recent episode depressed ([place]) 07/12/2017  . Benign essential HTN 02/21/2017  . Hyperlipidemia, mixed 02/21/2017  . Insomnia 12/27/2015  . Abnormal urinalysis 12/27/2015  . 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 Severe manic bipolar 1 disorder with psychotic behavior ([place]) 06/14/2015    Past Surgical History:  Procedure Laterality Date  . ABLATION ON ENDOMETRIOSIS    . CYST REMOVAL NECK     around 11 years ago /benign  . MULTIPLE TOOTH EXTRACTIONS       OB History   No obstetric history on file.     Family History  Problem Relation Age of Onset  . Arthritis Father   . Hyperlipidemia Father   . High blood pressure Father   . Diabetes Sister   . Diabetes Mother   . Diabetes Brother   . Mental illness Brother   .  Alcohol abuse Paternal Uncle   . Alcohol abuse Paternal Grandfather   . Breast cancer Maternal Aunt   . Breast cancer Paternal Aunt   . High blood pressure Sister   . Mental illness Other        runs in family    Social History   Tobacco Use  . Smoking status: Never Smoker  . Smokeless tobacco: Never Used  Vaping Use  . Vaping Use: Never used  Substance Use Topics  . Alcohol use: No  . Drug use: No    Home Medications Prior to Admission medications   Medication Sig Start Date End Date Taking? Authorizing Provider  amLODipine (NORVASC) 10 MG  tablet Take 1 tablet (10 mg total) by mouth daily. Patient not taking: No sig reported 11/13/20   Sharma Covert, MD  benztropine (COGENTIN) 1 MG tablet Take 1 tablet (1 mg total) by mouth 2 (two) times daily. Patient not taking: No sig reported 11/12/20   Sharma Covert, MD  carbamazepine (TEGRETOL) 100 MG chewable tablet Chew 1.5 tablets (150 mg total) by mouth 3 (three) times daily. Patient not taking: No sig reported 11/12/20   Sharma Covert, MD  carvedilol (COREG) 12.5 MG tablet Take 3 tablets (37.5 mg total) by mouth 2 (two) times daily with a meal. Patient not taking: No sig reported 11/12/20   Sharma Covert, MD  cloZAPine (CLOZARIL) 100 MG tablet TAKE 1 TABLET BY MOUTH EVERYDAY AT BEDTIME Patient not taking: No sig reported 11/12/20   Sharma Covert, MD  cloZAPine (CLOZARIL) 25 MG tablet Take 1 tablet (25 mg total) by mouth daily. Patient not taking: No sig reported 11/13/20   Sharma Covert, MD  hydrOXYzine (ATARAX/VISTARIL) 25 MG tablet Take 1 tablet (25 mg total) by mouth 3 (three) times daily as needed for anxiety. Patient not taking: No sig reported 11/12/20   Sharma Covert, MD  risperidone (RISPERDAL) 4 MG tablet Take by mouth. 01/24/21   [provider]  temazepam (RESTORIL) 30 MG capsule Take 1 capsule (30 mg total) by mouth at bedtime. Patient not taking: No sig reported 11/12/20   Sharma Covert, MD  traZODone (DESYREL) 50 MG tablet  01/21/21   [provider]    Allergies    Patient has no known allergies.  Review of Systems   Review of Systems  Unable to perform ROS: Psychiatric disorder    Physical Exam Updated Vital Signs BP (!) 145/97   Pulse (!) 121   Temp 97.6 F (36.4 C)   Resp (!) 21   Ht 5\' 1"  (1.549 m)   Wt 67 kg   SpO2 97%   BMI 27.91 kg/m   Physical Exam Vitals and nursing note reviewed.  Constitutional:      Appearance: She is well-developed.  HENT:     Head: Atraumatic.  Eyes:      Extraocular Movements: Extraocular movements intact.  Cardiovascular:     Rate and Rhythm: Tachycardia present.  Pulmonary:     Effort: Pulmonary effort is normal.  Abdominal:     General: Bowel sounds are normal.  Musculoskeletal:     Cervical back: Normal range of motion.  Skin:    General: Skin is dry.  Neurological:     Mental Status: She is alert and oriented to person, place, and time.     ED Results / Procedures / Treatments   Labs (all labs ordered are listed, but only abnormal results are displayed) Labs Reviewed  LACTIC ACID, PLASMA - Abnormal; Notable for the following components:      Result Value   Lactic Acid, Venous 2.7 (*)    All other components within normal limits  COMPREHENSIVE METABOLIC PANEL - Abnormal; Notable for the following components:   Sodium 167 (*)    Chloride 128 (*)    CO2 19 (*)    Glucose, Bld 144 (*)    BUN 97 (*)    Creatinine, Ser 3.38 (*)    Calcium 10.6 (*)    AST 11 (*)    GFR, Estimated 15 (*)    Anion gap 20 (*)    All other components within normal limits  CBC WITH DIFFERENTIAL/PLATELET - Abnormal; Notable for the following components:   WBC 11.0 (*)    RBC 5.98 (*)    Hemoglobin 16.9 (*)    HCT 54.9 (*)    Neutro Abs 8.1 (*)    Monocytes Absolute 1.1 (*)    All other components within normal limits  URINE CULTURE  SARS CORONAVIRUS 2 (TAT 6-24 HRS)  PROTIME-INR  APTT  LACTIC ACID, PLASMA  URINALYSIS, ROUTINE W REFLEX MICROSCOPIC    EKG EKG Interpretation  Date/Time:  Thursday February 03 2021 17:19:37 EST Ventricular Rate:  129 PR Interval:    QRS Duration: 89 QT Interval:  300 QTC Calculation: 440 R Axis:   -7 Text Interpretation: Sinus tachycardia RSR' in V1 or V2, right VCD or RVH Left ventricular hypertrophy Nonspecific T abnormalities, lateral leads new inferior and lateral TWI Confirmed by Varney Biles [phone]) on 02/03/2021 5:55:39 PM   Radiology DG Chest Port 1 View  Result Date: 02/03/2021 CLINICAL  DATA:  Possible sepsis and failure to thrive EXAM: PORTABLE CHEST 1 VIEW COMPARISON:  11/11/2019 FINDINGS: Cardiac shadow is within normal limits. The lungs are clear bilaterally. No bony abnormality is seen. IMPRESSION: No active disease. Electronically Signed   By: Inez Catalina M.D.   On: 02/03/2021 18:48    Procedures .Critical Care Performed by: Varney Biles, MD Authorized by: Varney Biles, MD   Critical care provider statement:    Critical care time (minutes):  102   Critical care was necessary to treat or prevent imminent or life-threatening deterioration of the following conditions:  Endocrine crisis, renal failure, dehydration and CNS failure or compromise   Critical care was time spent personally by me on the following activities:  Discussions with consultants, evaluation of patient's response to treatment, examination of patient, ordering and performing treatments and interventions, ordering and review of laboratory studies, ordering and review of radiographic studies, pulse oximetry, re-evaluation of patient's condition, obtaining history from patient or surrogate and review of old charts     Medications Ordered in ED Medications  dextrose 5 % and 0.45 % NaCl with KCl 20 mEq/L infusion (has no administration in time range)  lactated ringers bolus 2,000 mL (2,000 mLs Intravenous New Bag/Given 02/03/21 1804)    ED Course  I have reviewed the triage vital signs and the nursing notes.  Pertinent labs & imaging results that were available during my care of the patient were reviewed by me and considered in my medical decision making (see chart for details).  Clinical Course as of 02/03/21 1942  Thu Feb 03, 2021  1941 Sodium(!!): 167 Significant hyper natremia, likely because of the dehydration.  Patient also has renal failure with BUN of close to 100 with creatinine of over 3. 2 L of LR were given initially.  Discussed the case with hospitalist,  they would prefer D5  half-normal saline as maintenance fluid which has been ordered. [AN]  1941 Anion gap(!): 20 Likely due to renal failure, starvation. [AN]    Clinical Course User Index [AN] Varney Biles, MD   MDM Rules/Calculators/A&P                         55 year old female comes in a chief complaint of failure to thrive.  Patient appears quite dry.  She is not cooperative with history.  On exam, she is moving all 4 extremities, pupillary exam is normal.  Spoke with family to get supportive history.  It appears that she is not eating or drinking, likely psychogenic cause.  From what family stating, there might be some underlying paranoia preventing her from eating.  Other possibility includes medication side effect.  We will get basic labs to evaluate for renal failure, electrolyte disturbance.  Patient arrived to the ER tachycardic and hypotensive.  Blood pressure has responded to fluid bolus.  Infection is in the differential diagnosis, however thought to be unlikely.  Dehydration would be the driving process for any kind of abnormalities that might come about.  Patient denies SI, HI.  We will continue with IVC.   Final Clinical Impression(s) / ED Diagnoses Final diagnoses:  AKI (acute kidney injury) ([place])  Uremia  Acute hypernatremia    Rx / DC Orders ED Discharge Orders    None       Varney Biles, MD 02/03/21 1942

## 2021-02-03 NOTE — ED Notes (Signed)
Called pharmacy to verify medication compatibility of LR with D5 1/2NS with K. Pharmacist said it is compatible.

## 2021-02-03 NOTE — Progress Notes (Signed)
   02/03/21 2050  Assess: MEWS Score  Temp 97.7 F (36.5 C)  BP 137/79  Pulse Rate (!) 115  Resp 18  SpO2 99 %  O2 Device Room Air  Assess: MEWS Score  MEWS Temp 0  MEWS Systolic 0  MEWS Pulse 2  MEWS RR 0  MEWS LOC 0  MEWS Score 2  MEWS Score Color Yellow  Assess: if the MEWS score is Yellow or Red  Were vital signs taken at a resting state? Yes  Focused Assessment Change from prior assessment (see assessment flowsheet)  Early Detection of Sepsis Score *See Row Information* Low  MEWS guidelines implemented *See Row Information* Yes  Treat  MEWS Interventions Escalated (See documentation below)  Pain Scale 0-10  Pain Score 0  Take Vital Signs  Increase Vital Sign Frequency  Yellow: Q 2hr X 2 then Q 4hr X 2, if remains yellow, continue Q 4hrs  Escalate  MEWS: Escalate Yellow: discuss with charge nurse/RN and consider discussing with provider and RRT  Notify: Charge Nurse/RN  Name of Charge Nurse/RN Notified [place] RN  Date Charge Nurse/RN Notified 02/03/21  Time Charge Nurse/RN Notified 2050  Notify: Provider  Provider Name/Title DR Hal Hope  Date Provider Notified 02/03/21  Time Provider Notified 2140  Notification Type Page  Notification Reason Change in status  Provider response See new orders  Date of Provider Response 02/03/21  Time of Provider Response 2145  Notify: Rapid Response  Date Rapid Response Notified 02/03/21  Time Rapid Response Notified 2140  Document  Progress note created (see row info) Yes

## 2021-02-03 NOTE — H&P (Signed)
History and Physical    Paula Dickson JYN:[id number] DOB: January 18, 1966 DOA: 02/03/2021  PCP: Sandi Mariscal, MD  Patient coming from: Home.  History obtained from patient's sister.  Patient not able to provide good history.  Chief Complaint: Not taking any medications or eating food.  HPI: Paula Dickson is a 55 y.o. female with history of bipolar disorder and schizoaffective disorder who was discharged from [facility] psychiatric facility in [place] on January 21, 2021 as per the patient's sister was brought to the ER after patient since discharge from [facility] has not been eating or drinking or taking any of her medication and also has been having some hallucinations.  No suicidal thoughts or attempts.  Per patient's sister patient has been off lithium for more than a year.  Since patient has not been eating or drinking or taking medications patient was involuntarily committed and brought to the ER.  ED Course: In the ER patient is oriented to name and place.  Following commands.  Afebrile.  Labs show sodium of 167 creatinine of 3.3 which increased from 1.3 in December 29, 2020 about a month ago.  Bicarb of 19 lactic acid was elevated at 2.7 and WBC count of 11.  No signs of any infection.  Patient was initially mildly hypotensive improved with fluid bolus.  Admitted for acute renal failure with hypernatremia.  After admission patient went into A. fib with RVR.  Started on Cardizem infusion after Cardizem bolus did not improve the heart rate.  Covid test is negative.  Review of Systems: As per HPI, rest all negative.   Past Medical History:  Diagnosis Date  . Bipolar affective disorder ([place])   . History of arthritis   . History of chicken pox   . History of depression   . History of genital warts   . history of heart murmur   . History of high blood pressure   . History of thyroid disease   . History of UTI   . Hypertension   . Low TSH level 07/13/2017  . Schizophrenia  [facility])     Past Surgical History:  Procedure Laterality Date  . ABLATION ON ENDOMETRIOSIS    . CYST REMOVAL NECK     around 11 years ago /benign  . MULTIPLE TOOTH EXTRACTIONS       reports that she has never smoked. She has never used smokeless tobacco. She reports that she does not drink alcohol and does not use drugs.  No Known Allergies  Family History  Problem Relation Age of Onset  . Arthritis Father   . Hyperlipidemia Father   . High blood pressure Father   . Diabetes Sister   . Diabetes Mother   . Diabetes Brother   . Mental illness Brother   . Alcohol abuse Paternal Uncle   . Alcohol abuse Paternal Grandfather   . Breast cancer Maternal Aunt   . Breast cancer Paternal Aunt   . High blood pressure Sister   . Mental illness Other        runs in family    Prior to Admission medications   Medication Sig Start Date End Date Taking? Authorizing Provider  amLODipine (NORVASC) 10 MG tablet Take 1 tablet (10 mg total) by mouth daily. Patient not taking: No sig reported 11/13/20   Sharma Covert, MD  benztropine (COGENTIN) 1 MG tablet Take 1 tablet (1 mg total) by mouth 2 (two) times daily. Patient not taking: No sig reported 11/12/20  Sharma Covert, MD  carbamazepine (TEGRETOL) 100 MG chewable tablet Chew 1.5 tablets (150 mg total) by mouth 3 (three) times daily. Patient not taking: No sig reported 11/12/20   Sharma Covert, MD  carvedilol (COREG) 12.5 MG tablet Take 3 tablets (37.5 mg total) by mouth 2 (two) times daily with a meal. Patient not taking: No sig reported 11/12/20   Sharma Covert, MD  cloZAPine (CLOZARIL) 100 MG tablet TAKE 1 TABLET BY MOUTH EVERYDAY AT BEDTIME Patient not taking: No sig reported 11/12/20   Sharma Covert, MD  cloZAPine (CLOZARIL) 25 MG tablet Take 1 tablet (25 mg total) by mouth daily. Patient not taking: No sig reported 11/13/20   Sharma Covert, MD  hydrOXYzine (ATARAX/VISTARIL) 25 MG tablet Take 1 tablet (25 mg  total) by mouth 3 (three) times daily as needed for anxiety. Patient not taking: No sig reported 11/12/20   Sharma Covert, MD  risperidone (RISPERDAL) 4 MG tablet Take by mouth. 01/24/21   [provider]  temazepam (RESTORIL) 30 MG capsule Take 1 capsule (30 mg total) by mouth at bedtime. Patient not taking: No sig reported 11/12/20   Sharma Covert, MD  traZODone (DESYREL) 50 MG tablet  01/21/21   [provider]    Physical Exam: Constitutional: Moderately built and nourished. Vitals:   02/03/21 2044 02/03/21 2050 02/03/21 2118 02/03/21 2144  BP:  137/79 (!) 159/139   Pulse:  (!) 115 (!) 124 (!) 124  Resp:  18    Temp:  97.7 F (36.5 C)  97.7 F (36.5 C)  TempSrc:  Oral  Oral  SpO2:  99%    Weight: 55 kg     Height: 5\' 1"  (1.549 m)      Eyes: Anicteric no pallor. ENMT: No discharge from the ears eyes nose or mouth. Neck: No mass felt.  No neck rigidity. Respiratory: No rhonchi or crepitations. Cardiovascular: S1-S2 heard. Abdomen: Soft nontender bowel sounds present. Musculoskeletal: No edema. Skin: No rash. Neurologic: Alert awake oriented to name and place.  Moving all extremities. Psychiatric: Denies any suicidal thoughts.   Labs on Admission: I have personally reviewed following labs and imaging studies  CBC: Recent Labs  Lab 02/03/21 1757  WBC 11.0*  NEUTROABS 8.1*  HGB 16.9*  HCT 54.9*  MCV 91.8  PLT 834   Basic Metabolic Panel: Recent Labs  Lab 02/03/21 1757  NA 167*  K 4.6  CL 128*  CO2 19*  GLUCOSE 144*  BUN 97*  CREATININE 3.38*  CALCIUM 10.6*   GFR: Estimated Creatinine Clearance: 13.9 mL/min (A) (by C-G formula based on SCr of 3.38 mg/dL (H)). Liver Function Tests: Recent Labs  Lab 02/03/21 1757  AST 11*  ALT 12  ALKPHOS 83  BILITOT 1.1  PROT 7.4  ALBUMIN 4.2   No results for input(s): LIPASE, AMYLASE in the last 168 hours. No results for input(s): AMMONIA in the last 168 hours. Coagulation  Profile: Recent Labs  Lab 02/03/21 1757  INR 1.2   Cardiac Enzymes: No results for input(s): CKTOTAL, CKMB, CKMBINDEX, TROPONINI in the last 168 hours. BNP (last 3 results) No results for input(s): PROBNP in the last 8760 hours. HbA1C: No results for input(s): HGBA1C in the last 72 hours. CBG: No results for input(s): GLUCAP in the last 168 hours. Lipid Profile: No results for input(s): CHOL, HDL, LDLCALC, TRIG, CHOLHDL, LDLDIRECT in the last 72 hours. Thyroid Function Tests: No results for input(s): TSH, T4TOTAL, FREET4, T3FREE, THYROIDAB  in the last 72 hours. Anemia Panel: No results for input(s): VITAMINB12, FOLATE, FERRITIN, TIBC, IRON, RETICCTPCT in the last 72 hours. Urine analysis:    Component Value Date/Time   COLORURINE YELLOW 12/30/2020 1345   APPEARANCEUR HAZY (A) 12/30/2020 1345   LABSPEC 1.010 12/30/2020 1345   PHURINE 7.5 12/30/2020 1345   GLUCOSEU NEGATIVE 12/30/2020 1345   HGBUR MODERATE (A) 12/30/2020 1345   BILIRUBINUR NEGATIVE 12/30/2020 1345   BILIRUBINUR neg 12/27/2015 1421   KETONESUR NEGATIVE 12/30/2020 1345   PROTEINUR NEGATIVE 12/30/2020 1345   UROBILINOGEN 0.2 12/27/2015 1421   UROBILINOGEN 0.2 06/14/2015 0040   NITRITE NEGATIVE 12/30/2020 1345   LEUKOCYTESUR LARGE (A) 12/30/2020 1345   Sepsis Labs: @LABRCNTIP (procalcitonin:4,lacticidven:4) )No results found for this or any previous visit (from the past 240 hour(s)).   Radiological Exams on Admission: DG Chest Port 1 View  Result Date: 02/03/2021 CLINICAL DATA:  Possible sepsis and failure to thrive EXAM: PORTABLE CHEST 1 VIEW COMPARISON:  11/11/2019 FINDINGS: Cardiac shadow is within normal limits. The lungs are clear bilaterally. No bony abnormality is seen. IMPRESSION: No active disease. Electronically Signed   By: Inez Catalina M.D.   On: 02/03/2021 18:48    EKG: Independently reviewed.  A. fib with RVR.  Assessment/Plan Principal Problem:   AKI (acute kidney injury) ([place]) Active  Problems:   Schizophrenia ([place])   Bipolar disorder, most recent episode depressed ([place])   Hypernatremia    1. Acute renal failure with hypernatremia likely from not taking anything orally and dehydration.  Patient is mildly hypotensive at presentation which improved with fluid bolus at this time I kept patient on D5W.  Follow metabolic panel intake output closely. 2. A. fib with RVR appears to be new onset.  Will check TSH free T4 and troponins and 2D echo.  Initially Cardizem bolus brought in the heart rate low but patient again went to A. fib with RVR so started on Cardizem infusion.  Cardiology notified.  Patient on heparin infusion.  Chads 2 vasc score of 2. 3. History of hypertension presently on Cardizem infusion. 4. History of bipolar disorder and schizoaffective disorder has not been taking any of her medications for last 2 weeks.  Will consult psychiatry.  Given that patient has severe hyponatremia and acute renal failure with A. fib with RVR will need inpatient status.   DVT prophylaxis: Heparin infusion. Code Status: Full code. Family Communication: Patient's sister. Disposition Plan: To be determined. Consults called: Cardiology and psychiatry. Admission status: Inpatient.   [name] MD [facility] Pager [phone].  If 7PM-7AM, please contact night-coverage [email or web address] Password [facility]  02/03/2021, 9:45 PM

## 2021-02-03 NOTE — Progress Notes (Addendum)
Rapid Response Event Note   Reason for Call :  New onset Afib  Initial Focused Assessment:   55yo came in w/ c/o failure to thrive and AMS. Per family, pt hasn't eaten/drank [employer] d/t increasing paranoia (thinks family is trying to poison her). pt was IVC'ed by family d/t paranoia. On admission, Na+ 167, LA 2.7, 100% RA. Original EKG @ 1700 showed ST, however once OTF, new EKG showed new onset irregular Afib RVR.   Interventions:  One time 10mg  Cardizem push & started on 5,000U heparin SQ Q8 for VTE.  Plan of Care:  Pt okay to stay OTF for now. Possible Cardizem or Amio gtt will be started. 4W can titrate cardizem x3 before pt has to be transferred to ICU/SD. RR RN will continue to monitor remotely.   MD Notified: Hal Hope, MD Call Time: 2133p Arrival Time: 2205p End Time: 22:25p  [name] Simmonds, RN

## 2021-02-04 ENCOUNTER — Inpatient Hospital Stay (HOSPITAL_COMMUNITY): Payer: 59

## 2021-02-04 ENCOUNTER — Other Ambulatory Visit: Payer: Self-pay

## 2021-02-04 DIAGNOSIS — I480 Paroxysmal atrial fibrillation: Secondary | ICD-10-CM | POA: Diagnosis not present

## 2021-02-04 DIAGNOSIS — N179 Acute kidney failure, unspecified: Principal | ICD-10-CM

## 2021-02-04 DIAGNOSIS — F3130 Bipolar disorder, current episode depressed, mild or moderate severity, unspecified: Secondary | ICD-10-CM | POA: Diagnosis not present

## 2021-02-04 DIAGNOSIS — E0590 Thyrotoxicosis, unspecified without thyrotoxic crisis or storm: Secondary | ICD-10-CM | POA: Diagnosis not present

## 2021-02-04 DIAGNOSIS — E870 Hyperosmolality and hypernatremia: Secondary | ICD-10-CM | POA: Diagnosis not present

## 2021-02-04 LAB — BASIC METABOLIC PANEL
Anion gap: 12 (ref 5–15)
Anion gap: 6 (ref 5–15)
BUN: 83 mg/dL — ABNORMAL HIGH (ref 6–20)
BUN: 75 mg/dL — ABNORMAL HIGH (ref 6–20)
CO2: 24 mmol/L (ref 22–32)
CO2: 28 mmol/L (ref 22–32)
Calcium: 9.9 mg/dL (ref 8.9–10.3)
Calcium: 9.9 mg/dL (ref 8.9–10.3)
Chloride: 119 mmol/L — ABNORMAL HIGH (ref 98–111)
Chloride: 116 mmol/L — ABNORMAL HIGH (ref 98–111)
Creatinine, Ser: 2.3 mg/dL — ABNORMAL HIGH (ref 0.44–1.00)
Creatinine, Ser: 2.03 mg/dL — ABNORMAL HIGH (ref 0.44–1.00)
GFR, Estimated: 24 mL/min — ABNORMAL LOW (ref >60)
GFR, Estimated: 28 mL/min — ABNORMAL LOW (ref >60)
Glucose, Bld: 221 mg/dL — ABNORMAL HIGH (ref 70–99)
Glucose, Bld: 184 mg/dL — ABNORMAL HIGH (ref 70–99)
Potassium: 3.7 mmol/L (ref 3.5–5.1)
Potassium: 3.3 mmol/L — ABNORMAL LOW (ref 3.5–5.1)
Sodium: 155 mmol/L — ABNORMAL HIGH (ref 135–145)
Sodium: 150 mmol/L — ABNORMAL HIGH (ref 135–145)

## 2021-02-04 LAB — MAGNESIUM: Magnesium: 2.5 mg/dL — ABNORMAL HIGH (ref 1.7–2.4)

## 2021-02-04 LAB — PHOSPHORUS: Phosphorus: 2.3 mg/dL — ABNORMAL LOW (ref 2.5–4.6)

## 2021-02-04 LAB — LITHIUM LEVEL: Lithium Lvl: <0.06 mmol/L — ABNORMAL LOW (ref 0.60–1.20)

## 2021-02-04 LAB — TROPONIN I (HIGH SENSITIVITY): Troponin I (High Sensitivity): 40 ng/L — ABNORMAL HIGH (ref <18)

## 2021-02-04 LAB — SARS CORONAVIRUS 2 (TAT 6-24 HRS): SARS Coronavirus 2: NEGATIVE

## 2021-02-04 LAB — T4, FREE: Free T4: 2.28 ng/dL — ABNORMAL HIGH (ref 0.61–1.12)

## 2021-02-04 LAB — HIV ANTIBODY (ROUTINE TESTING W REFLEX): HIV Screen 4th Generation wRfx: NONREACTIVE

## 2021-02-04 MED ORDER — HEPARIN (PORCINE) 25000 UT/250ML-% IV SOLN
750.0000 [IU]/h | INTRAVENOUS | Status: DC
  Administered 2021-02-04: 750 [IU]/h via INTRAVENOUS
  Filled 2021-02-04 (×2): qty 250

## 2021-02-04 MED ORDER — ADULT MULTIVITAMIN W/MINERALS CH
1.0000 | ORAL_TABLET | Freq: Every day | ORAL | Status: DC
  Administered 2021-02-07 – 2021-02-20 (×7): 1 via ORAL
  Filled 2021-02-04 (×10): qty 1

## 2021-02-04 MED ORDER — PROPRANOLOL HCL 40 MG PO TABS
40.0000 mg | ORAL_TABLET | Freq: Two times a day (BID) | ORAL | Status: DC
  Administered 2021-02-05 – 2021-02-20 (×22): 40 mg via ORAL
  Filled 2021-02-04 (×26): qty 1

## 2021-02-04 MED ORDER — METOPROLOL TARTRATE 25 MG PO TABS: 12.5000 mg | ORAL_TABLET | Freq: Two times a day (BID) | ORAL | Status: DC

## 2021-02-04 MED ORDER — METHIMAZOLE 10 MG PO TABS
10.0000 mg | ORAL_TABLET | Freq: Two times a day (BID) | ORAL | Status: DC
  Administered 2021-02-05 – 2021-02-20 (×22): 10 mg via ORAL
  Filled 2021-02-04 (×32): qty 1

## 2021-02-04 MED ORDER — HALOPERIDOL LACTATE 5 MG/ML IJ SOLN
2.0000 mg | Freq: Four times a day (QID) | INTRAMUSCULAR | Status: DC | PRN
  Administered 2021-02-05 – 2021-02-09 (×6): 2 mg via INTRAVENOUS
  Filled 2021-02-04 (×6): qty 1

## 2021-02-04 MED ORDER — HEPARIN BOLUS VIA INFUSION
1000.0000 [IU] | Freq: Once | INTRAVENOUS | Status: AC
  Administered 2021-02-04: 1000 [IU] via INTRAVENOUS
  Filled 2021-02-04: qty 1000

## 2021-02-04 MED ORDER — ENOXAPARIN SODIUM 60 MG/0.6ML ~~LOC~~ SOLN
60.0000 mg | SUBCUTANEOUS | Status: DC
  Administered 2021-02-04 – 2021-02-05 (×2): 60 mg via SUBCUTANEOUS
  Filled 2021-02-04 (×2): qty 0.6

## 2021-02-04 MED ORDER — POTASSIUM CHLORIDE CRYS ER 20 MEQ PO TBCR
40.0000 meq | EXTENDED_RELEASE_TABLET | Freq: Once | ORAL | Status: DC
  Filled 2021-02-04: qty 2

## 2021-02-04 MED ORDER — METOPROLOL TARTRATE 5 MG/5ML IV SOLN: 5.0000 mg | Freq: Three times a day (TID) | INTRAVENOUS | Status: DC | PRN

## 2021-02-04 NOTE — Consult Note (Signed)
Paula Dickson is a 55 y.o. female with history of bipolar disorder and schizoaffective disorder who was discharged from [facility] psychiatric facility in [place] on January 21, 2021 as per the patient's sister was brought to the ER after patient since discharge from [facility] has not been eating or drinking or taking any of her medication and also has been having some hallucinations.  No suicidal thoughts or attempts.  Per patient's sister patient has been off lithium for more than a year.  Since patient has not been eating or drinking or taking medications patient was involuntarily committed and brought to the ER.  Psychiatry consult placed for bipolar disorder, not taking any medications. Patient currently alert to self only, patient appears to be confused, patient is unable to participate in today's assessment, as she appears to be drowsy, confused.   Per chart review patient has clinical hyper thyroidism, in which her psychiatric symptoms closely align.  Although patient does have underlying diagnosis of schizophrenia and bipolar, her current presentation is most likely related to her hyperthyroidism.  She previously presented in November 2021, with similar presentation and noted multiple thyroid nodules on evaluation.  There does not appear to be any follow-up from endocrinology, and or appropriate recommendations to manage her hyperthyroidism.    -Recommend resuming medication that she was previously discharged on from [facility] 2 weeks ago. -Please re- consult psychiatry once patient is appropriate and able to participate in psychiatric evaluation. Although psychiatric evaluation may not be warranted as patient was just released from inpatient psych hospital, and symptoms appear to be from hyperthyroidism at this time. - Recommend consult with endocrinology for further management of hyperthyroidism.

## 2021-02-04 NOTE — Progress Notes (Signed)
Initial Nutrition Assessment  INTERVENTION:   Monitor magnesium, potassium, and phosphorus daily for at least 3 days, MD to replete as needed, as pt is at risk for refeeding syndrome.  -Ensure Enlive po BID, each supplement provides 350 kcal and 20 grams of protein -Please open in front of patient  -Multivitamin with minerals daily  NUTRITION DIAGNOSIS:   Inadequate oral intake related to chronic illness as evidenced by per patient/family report,meal completion < 25%.  GOAL:   Patient will meet greater than or equal to 90% of their needs  MONITOR:   PO intake,Supplement acceptance,Labs,Weight trends,I & O's  REASON FOR ASSESSMENT:   Malnutrition Screening Tool    ASSESSMENT:   55 y.o. female with history of bipolar disorder and schizoaffective disorder who was discharged from [facility] psychiatric facility in [place] on January 21, 2021 as per the patient's sister was brought to the ER after patient since discharge from [facility] has not been eating or drinking or taking any of her medication and also has been having some hallucinations.  No suicidal thoughts or attempts.  Per patient's sister patient has been off lithium for more than a year.  Since patient has not been eating or drinking or taking medications patient was involuntarily committed and brought to the ER.  Patient currently alert/oriented x 2. Refusing meals and medications at this time.  Per reports from family, pt has been refusing to eat or drink for 2 weeks now d/t paranoia that it has been poisoned.  Pt is also s/p rapid response last night for a-fib.  Given prolonged starvation ,would monitor for refeeding syndrome and replete Mg/Phos/K as needed.  Per weight records, pt has lost 42 lbs since 10/25/20 (25% wt loss x 3.5 months, significant for time frame). Suspect some degree of malnutrition.  Medications: KLOR-CON  Labs reviewed: Elevated Na (150) Low K  NUTRITION - FOCUSED PHYSICAL  EXAM:  Deferred given mental status.  Diet Order:   Diet Order            Diet regular Room service appropriate? Yes; Fluid consistency: Thin  Diet effective now                 EDUCATION NEEDS:   No education needs have been identified at this time  Skin:  Skin Assessment: Reviewed RN Assessment  Last BM:  PTA  Height:   Ht Readings from Last 1 Encounters:  02/03/21 5\' 1"  (1.549 m)    Weight:   Wt Readings from Last 1 Encounters:  02/03/21 55.6 kg   BMI:  Body mass index is 23.16 kg/m.  Estimated Nutritional Needs:   Kcal:  1450-1650  Protein:  70-85g  Fluid:  1.6L/day  Clayton Bibles, MS, RD, LDN Inpatient Clinical Dietitian Contact information available via Amion

## 2021-02-04 NOTE — Consult Note (Addendum)
Cardiology Consultation:   Patient ID: Paula Dickson MRN: [id number]; DOB: 1966/01/20  Admit date: 02/03/2021 Date of Consult: 02/04/2021  PCP:  Sandi Mariscal, [place]  Cardiologist:  Remotely seen by Dr. Nehemiah Massed at the [facility] [facility]; new to [place] Provider:  No care team member to display Electrophysiologist:  None :[id number]}    Patient Profile:   Paula Dickson is a 54 y.o. female with a PMH of HTN, HLD, hypothyroidism, and bipolar disorder/schizoaffective disorder with recent psychiatric admission 12/2020, who is being seen today for the evaluation of atrial fibrillation at the request of Dr. Karleen Hampshire.  History of Present Illness:   [name] was brought to the ED 02/03/21 by EMS given family's concerns that the patient was not eating, drinking, or taking her medications. She was reported to have been discharged from [facility] psychiatric facility in [place] 01/21/21 and upon returning home has not been taking her medications. She reportedly was having hallucinations though no SI. Per family patient reported concerns that her food was being poisoned, therefore she refused to eat it.   She was tachycardic and hypertensive on arrival. Initial EKG showed sinus tachycardia with rate 121 bpm, TWI in inferolateral leads, no STE. Labs showed Na 167, K 4.6, Cr 3.38 (baseline 1.1-1.3), WBC 11.0, Hgb 16.9, PLT 207, Lactate 2.7, HsTrop 37>40. She was admitted to medicine and given IVF's with improvement in Na to 150 and Cr to 2.03. Shortly after admission she went into atrial fibrillation with RVR, confirmed on repeat EKG with HR up to 189 bpm. She was started on a diltiazem gtt with improvement in HR. TSH <0.01 with FT4 elevated to 2.28. Echo pending. Cardiology asked to evaluate.   Appears she has no significant cardiac history. She was seen by Dr. Nehemiah Massed at the [place] [facility] in 2018 for chest pain and underwent a NST which  was reportedly normal at that time.   Attempted to talk with patient in regards to her newly diagnosed arrhythmia, however patient would not engage in the conversation. Additionally she refused exam.    Past Medical History:  Diagnosis Date  . Bipolar affective disorder ([place])   . History of arthritis   . History of chicken pox   . History of depression   . History of genital warts   . history of heart murmur   . History of high blood pressure   . History of thyroid disease   . History of UTI   . Hypertension   . Low TSH level 07/13/2017  . Schizophrenia [facility])     Past Surgical History:  Procedure Laterality Date  . ABLATION ON ENDOMETRIOSIS    . CYST REMOVAL NECK     around 11 years ago /benign  . MULTIPLE TOOTH EXTRACTIONS       Home Medications:  Prior to Admission medications   Medication Sig Start Date End Date Taking? Authorizing Provider  amLODipine (NORVASC) 10 MG tablet Take 1 tablet (10 mg total) by mouth daily. Patient not taking: No sig reported 11/13/20   Sharma Covert, MD  benztropine (COGENTIN) 1 MG tablet Take 1 tablet (1 mg total) by mouth 2 (two) times daily. Patient not taking: No sig reported 11/12/20   Sharma Covert, MD  carbamazepine (TEGRETOL) 100 MG chewable tablet Chew 1.5 tablets (150 mg total) by mouth 3 (three) times daily. Patient not taking: No sig reported 11/12/20   Sharma Covert, MD  carvedilol ([place])  12.5 MG tablet Take 3 tablets (37.5 mg total) by mouth 2 (two) times daily with a meal. Patient not taking: No sig reported 11/12/20   Sharma Covert, MD  cloZAPine (CLOZARIL) 100 MG tablet TAKE 1 TABLET BY MOUTH EVERYDAY AT BEDTIME Patient not taking: No sig reported 11/12/20   Sharma Covert, MD  cloZAPine (CLOZARIL) 25 MG tablet Take 1 tablet (25 mg total) by mouth daily. Patient not taking: No sig reported 11/13/20   Sharma Covert, MD  hydrOXYzine (ATARAX/VISTARIL) 25 MG tablet Take 1 tablet (25 mg total) by  mouth 3 (three) times daily as needed for anxiety. Patient not taking: No sig reported 11/12/20   Sharma Covert, MD  risperidone (RISPERDAL) 4 MG tablet Take by mouth. 01/24/21   [provider]  temazepam (RESTORIL) 30 MG capsule Take 1 capsule (30 mg total) by mouth at bedtime. Patient not taking: No sig reported 11/12/20   Sharma Covert, MD  traZODone (DESYREL) 50 MG tablet  01/21/21   [provider]    Inpatient Medications: Scheduled Meds: . enoxaparin (LOVENOX) injection  60 mg Subcutaneous Q24H  . feeding supplement  237 mL Oral BID BM  . methimazole  10 mg Oral BID  . metoprolol tartrate  12.5 mg Oral BID  . potassium chloride  40 mEq Oral Once   Continuous Infusions: . dextrose 75 mL/hr at 02/04/21 1538   PRN Meds: acetaminophen **OR** acetaminophen, haloperidol lactate  Allergies:   No Known Allergies  Social History:   Social History   Socioeconomic History  . Marital status: Single    Spouse name: Not on file  . Number of children: Not on file  . Years of education: Not on file  . Highest education level: Patient refused  Occupational History  . Not on file  Tobacco Use  . Smoking status: Never Smoker  . Smokeless tobacco: Never Used  Vaping Use  . Vaping Use: Never used  Substance and Sexual Activity  . Alcohol use: No  . Drug use: No  . Sexual activity: Not Currently  Other Topics Concern  . Not on file  Social History Narrative  . Not on file   Social Determinants of Health   Financial Resource Strain: Not on file  Food Insecurity: Not on file  Transportation Needs: Not on file  Physical Activity: Not on file  Stress: Not on file  Social Connections: Not on file  Intimate Partner Violence: Not on file    Family History:    Family History  Problem Relation Age of Onset  . Arthritis Father   . Hyperlipidemia Father   . High blood pressure Father   . Diabetes Sister   . Diabetes Mother   . Diabetes Brother   .  Mental illness Brother   . Alcohol abuse Paternal Uncle   . Alcohol abuse Paternal Grandfather   . Breast cancer Maternal Aunt   . Breast cancer Paternal Aunt   . High blood pressure Sister   . Mental illness Other        runs in family     ROS:  Please see the history of present illness.   All other ROS reviewed and negative.     Physical Exam/Data:   Vitals:   02/04/21 0900 02/04/21 0938 02/04/21 1000 02/04/21 1200  BP: 126/63 126/63 128/68 122/72  Pulse: 85 87 81 80  Resp: 16 (!) 21 18 16   Temp:  99.2 F (37.3 C)  TempSrc:  Axillary    SpO2: 95% 96% 97% 97%  Weight:      Height:        Intake/Output Summary (Last 24 hours) at 02/04/2021 1618 Last data filed at 02/04/2021 1500 Gross per 24 hour  Intake 3243.28 ml  Output 700 ml  Net 2543.28 ml   Last 3 Weights 02/03/2021 02/03/2021 02/03/2021  Weight (lbs) 122 lb 9.2 oz 121 lb 4.1 oz 147 lb 11.3 oz  Weight (kg) 55.6 kg 55 kg 67 kg  Some encounter information is confidential and restricted. Go to Review Flowsheets activity to see all data.     Body mass index is 23.16 kg/m.  General:  Laying in bed in NAD  Patient refused remainder of exam   EKG:  The EKG was personally reviewed and demonstrates:  sinus tachycardia with rate 121 bpm, TWI in inferolateral leads, no STE; repeat EKG with atrial fibrillation with RVR with rate 189 bpm. Telemetry:  Telemetry was personally reviewed and demonstrates:  Patient converted to sinus rhythm  Relevant CV Studies: Echocardiogram pending  Laboratory Data:  High Sensitivity Troponin:   Recent Labs  Lab 02/03/21 2217 02/04/21 0123  TROPONINIHS 37* 40*     Chemistry Recent Labs  Lab 02/03/21 1757 02/03/21 2217 02/04/21 0426 02/04/21 1003  NA 167*  --  155* 150*  K 4.6  --  3.7 3.3*  CL 128*  --  119* 116*  CO2 19*  --  24 28  GLUCOSE 144*  --  221* 184*  BUN 97*  --  83* 75*  CREATININE 3.38* 2.50* 2.30* 2.03*  CALCIUM 10.6*  --  9.9 9.9  GFRNONAA 15* 22* 24*  28*  ANIONGAP 20*  --  12 6    Recent Labs  Lab 02/03/21 1757  PROT 7.4  ALBUMIN 4.2  AST 11*  ALT 12  ALKPHOS 83  BILITOT 1.1   Hematology Recent Labs  Lab 02/03/21 1757 02/03/21 2217  WBC 11.0* 12.9*  RBC 5.98* 5.50*  HGB 16.9* 15.6*  HCT 54.9* 50.7*  MCV 91.8 92.2  MCH 28.3 28.4  MCHC 30.8 30.8  RDW 13.7 13.7  PLT 207 178   BNPNo results for input(s): BNP, PROBNP in the last 168 hours.  DDimer No results for input(s): DDIMER in the last 168 hours.   Radiology/Studies:  DG Chest Port 1 View  Result Date: 02/03/2021 CLINICAL DATA:  Possible sepsis and failure to thrive EXAM: PORTABLE CHEST 1 VIEW COMPARISON:  11/11/2019 FINDINGS: Cardiac shadow is within normal limits. The lungs are clear bilaterally. No bony abnormality is seen. IMPRESSION: No active disease. Electronically Signed   By: Inez Catalina M.D.   On: 02/03/2021 18:48     Assessment and Plan:   1. New onset atrial fibrillation: patient initially in sinus tachycardia on arrival, then repeat EKG showed atrial fibrillation with RVR with rate 189 bpm. She was started on a diltiazem gtt with improvement in HR. Echo pending. TSH was <0.010 with FT4 elevated to 2.28. She had markedly elevated Na and Cr levels on admission in the setting of poor po intake over the past few days which are both improving with IVFs. She was started on a heparin gtt for stroke ppx.  CHA2DS2-VASc Score = 2 [CHF History: No, HTN History: Yes, Diabetes History: No, Stroke History: No, Vascular Disease History: No, Age Score: 0, Gender Score: 1].  Therefore, the patient's annual risk of stroke is 2.2 %. However risk of stroke increased with hyperthyroidism -  Suspect she would be a poor candidate for anticoagulation due to concerns for non-compliance. Currently refusing po/IV medications. Planning to transition to lovenox  - Will start propranolol  2. HTN: BP is stable on diltiazem gtt. Reported to be on amlodipine 10mg  daily and carvedilol  37.5mg  BID prior to arrival  - Will start propranolol   3. HLD: LDL 95 09/2020 - Continue dietary/lifestyle modifications for cholesterol control  4. Hypernatremia: Na 167 on arrival, improved to 150 today with IVFs. Likely due to poor po intake for the past several days.  - Continue management per primary team  5. AKI: Cr 3.3 on admission, down to 2.03 today with IVFs. Likely due to poor po intake for the past several days - Continue close monitoring  6. Hyperthyroidism: TSH <0.010 and FT4 elevated to 2.38. Thyroid US 10/2020 showed multinodular goiter recommended for ultrasound-guided biopsy, though does not appear this has happened. Likely contributing to #1. Planning to start methimazole tonight - Continue management per primary team  7. Bipolar disorder/schizoaffective disorder: recently released from psychiatric hospital in [facility] 01/21/21. Has not been taking her medications. Paranoid, thinking family is poisoning her food. Reported to have hallucinations. Currently refusing po/IV medications - Continue management per primary team    Risk Assessment/Risk Scores:          CHA2DS2-VASc Score = 2  This indicates a 2.2% annual risk of stroke. The patient's score is based upon: CHF History: No HTN History: Yes Diabetes History: No Stroke History: No Vascular Disease History: No Age Score: 0 Gender Score: 1          For questions or updates, please contact [place] Please consult [email or web address] for contact info under    Signed, Abigail Butts, PA-C  02/04/2021 4:18 PM   I have seen and examined the patient along with Abigail Butts, PA-C.  I have reviewed the chart, notes and new data.  I agree with PA/NP's note.  Key new complaints: she refuses to be examined and will not answer questions Key examination changes: unable to examine Key new findings / data: ecg shows paroxysmal atrial fibrillation with extreme RVR. TSH undetectable.  PLAN: Meets  criteria for anticoagulation due to thyrotoxicosis, but currently refusing oral meds. On enoxaparin. Optimal rate control medication is propranolol due to its fringe benefits in hyperthyroidism. Would titrate this up to achieve a resting heart rate around 60. Avoid diuretics and RAAS inh until her hyponatremia is corrected. Avoid amiodarone (toxic multinodular goiter). Arrhythmia is likely to resolve with correction of hyperthyroidism. Treatment will be very challenging until her psychiatric condition improves, obviously thyrotoxicosis may be exacerbating this.  Sanda Klein, MD, [place] [phone] 02/04/2021, 4:20 PM

## 2021-02-04 NOTE — Progress Notes (Signed)
PROGRESS NOTE    Paula Dickson  PJK:[id number] DOB: 1966/01/16 DOA: 02/03/2021 PCP: Sandi Mariscal, MD    Chief Complaint  Patient presents with  . Failure to thrive    Brief Narrative:   55 y.o. female with history of bipolar disorder and schizoaffective disorder who was discharged from [facility] psychiatric facility in [place] on January 21, 2021 as per the patient's sister was brought to the ER after patient since discharge from [facility] has not been eating or drinking or taking any of her medication and also has been having some hallucinations.  On arrival to ED she was found to be in atrial fibrillation with RVR started on Cardizem infusion.  She was also started on IV heparin but she refused blood draws to check heparin level since transition to Lovenox injection.     Assessment & Plan:   Principal Problem:   AKI (acute kidney injury) ([place]) Active Problems:   Schizophrenia ([place])   Bipolar disorder, most recent episode depressed ([place])   Hypernatremia    AKI with metabolic acidosis and hyper natremia Probably secondary to poor oral intake and free water deficit Improving with IV fluids.  Baseline creatinine less than 1, admitted with a creatinine of 3.3 and currently creatinine is improved to after IV fluids Sodium has been at 167 on admission improved to 150 today. Bicarb levels have improved   Hypokalemia Replace potassium.  Check magnesium and phosphorus levels  Atrial fibrillation with RVR Probably secondary to hyperthyroidism Was started on Cardizem gtt. transition to oral metoprolol when able to take by mouth. Echocardiogram ordered and pending. She was started on IV heparin for anticoagulation, refusing blood draws to check heparin levels hence was transitioned to Lovenox injections. CHA2DS2-VASc score is 2. Cardiology on board.     History of bipolar disorder and schizophrenia  patient was on  risperidone clozapine, carbamazepine, Restoril,  trazodone, Geodon. Since discharge from the psychiatric facility she has not been taking any of her meds. She was started on IV Haldol for agitation.  Psychiatric consulted, suggest her presentation is most likely secondary to hyperthyroidism.  They recommend to continue medications that she was previously discharged on from [facility] psychiatric facility 2 weeks ago.  On further talking to her sister she reports that people from [facility] psychiatric facility said that she was not getting better and hence they discharged her home.     Hyperthyroidism She has multinodular goiter on ultrasound of the thyroid from December 2021,. Lab work reveal depressed TSH and elevated free T4, free T3 levels are pending at this time.  Thyroid-stimulating antibodies are are negative from December 2021. Thyrotropin receptor antibodies are ordered and pending She does not appear to be in thyrotoxicosis, currently her heart rate is between 80 to 100/min,  blood pressure appears to be optimal. She will be started on methimazole 10 mg BID.  She is currently refusing her meds, She will need endocrinology appointment/follow-up on discharge.        DVT prophylaxis: (Lovenox) Code Status: (Full code) Family Communication: discussed with sister over the phone.  Disposition:   Status is: Inpatient  Remains inpatient appropriate because:Ongoing diagnostic testing needed not appropriate for outpatient work up and IV treatments appropriate due to intensity of illness or inability to take PO   Dispo: The patient is from: Home              Anticipated d/c is to: pending.  Patient currently is not medically stable to d/c.   Difficult to place patient No       Consultants:   Psychiatry  CARDIOLOGY.   Procedures: none  Antimicrobials: NONE    Subjective:  Pushed  Me away when I tried to examine her.  Objective: Vitals:   02/03/21 2322 02/04/21 0144 02/04/21 0340 02/04/21 0938   BP: (!) 159/139 111/72 110/73 126/63  Pulse:  (!) 107 96 87  Resp: 18 (!) 21 (!) 22 (!) 21  Temp:  98.2 F (36.8 C) 98.4 F (36.9 C) 99.2 F (37.3 C)  TempSrc:  Axillary Axillary Axillary  SpO2: 99%   96%  Weight: 55.6 kg     Height: 5\' 1"  (1.549 m)       Intake/Output Summary (Last 24 hours) at 02/04/2021 1534 Last data filed at 02/04/2021 1500 Gross per 24 hour  Intake 3243.28 ml  Output 700 ml  Net 2543.28 ml   Filed Weights   02/03/21 1711 02/03/21 2044 02/03/21 2322  Weight: 67 kg 55 kg 55.6 kg    Examination:  Pt did not want to be examined at this time.    Data Reviewed: I have personally reviewed following labs and imaging studies  CBC: Recent Labs  Lab 02/03/21 1757 02/03/21 2217  WBC 11.0* 12.9*  NEUTROABS 8.1*  --   HGB 16.9* 15.6*  HCT 54.9* 50.7*  MCV 91.8 92.2  PLT 207 426    Basic Metabolic Panel: Recent Labs  Lab 02/03/21 1757 02/03/21 2217 02/04/21 0426 02/04/21 1003  NA 167*  --  155* 150*  K 4.6  --  3.7 3.3*  CL 128*  --  119* 116*  CO2 19*  --  24 28  GLUCOSE 144*  --  221* 184*  BUN 97*  --  83* 75*  CREATININE 3.38* 2.50* 2.30* 2.03*  CALCIUM 10.6*  --  9.9 9.9    GFR: Estimated Creatinine Clearance: 23.1 mL/min (A) (by C-G formula based on SCr of 2.03 mg/dL (H)).  Liver Function Tests: Recent Labs  Lab 02/03/21 1757  AST 11*  ALT 12  ALKPHOS 83  BILITOT 1.1  PROT 7.4  ALBUMIN 4.2    CBG: No results for input(s): GLUCAP in the last 168 hours.   Recent Results (from the past 240 hour(s))  SARS CORONAVIRUS 2 (TAT 6-24 HRS) Nasopharyngeal Nasopharyngeal Swab     Status: None   Collection Time: 02/03/21  7:00 PM   Specimen: Nasopharyngeal Swab  Result Value Ref Range Status   SARS Coronavirus 2 NEGATIVE NEGATIVE Final    Comment: (NOTE) SARS-CoV-2 target nucleic acids are NOT DETECTED.  The SARS-CoV-2 RNA is generally detectable in upper and lower respiratory specimens during the acute phase of infection.  Negative results do not preclude SARS-CoV-2 infection, do not rule out co-infections with other pathogens, and should not be used as the sole basis for treatment or other patient management decisions. Negative results must be combined with clinical observations, patient history, and epidemiological information. The expected result is Negative.  Fact Sheet for Patients: SugarRoll.be  Fact Sheet for Healthcare Providers: https://www.woods-mathews.com/  This test is not yet approved or cleared by the [place] FDA and  has been authorized for detection and/or diagnosis of SARS-CoV-2 by FDA under an Emergency Use Authorization (EUA). This EUA will remain  in effect (meaning this test can be used) for the duration of the COVID-19 declaration under Se ction 564(b)(1) of the Act, 21 U.S.C. section 360bbb-3(b)(1), unless  the authorization is terminated or revoked sooner.  Performed at [facility], [place] [address] [place], [place] [address]          Radiology Studies: DG Chest Port 1 View  Result Date: 02/03/2021 CLINICAL DATA:  Possible sepsis and failure to thrive EXAM: PORTABLE CHEST 1 VIEW COMPARISON:  11/11/2019 FINDINGS: Cardiac shadow is within normal limits. The lungs are clear bilaterally. No bony abnormality is seen. IMPRESSION: No active disease. Electronically Signed   By: Inez Catalina M.D.   On: 02/03/2021 18:48        Scheduled Meds: . enoxaparin (LOVENOX) injection  60 mg Subcutaneous Q24H  . feeding supplement  237 mL Oral BID BM  . metoprolol tartrate  12.5 mg Oral BID  . potassium chloride  40 mEq Oral Once   Continuous Infusions: . dextrose 125 mL/hr at 02/04/21 1340  . diltiazem (CARDIZEM) infusion 7.5 mg/hr (02/04/21 1436)     LOS: 1 day        Hosie Poisson, MD [facility]   To contact the attending provider between 7A-7P or the covering provider during after hours 7P-7A, please log  into the web site [email or web address] and access using universal Meta password for that web site. If you do not have the password, please call the hospital operator.  02/04/2021, 3:34 PM

## 2021-02-04 NOTE — Progress Notes (Signed)
ANTICOAGULATION CONSULT NOTE - Initial Consult  Pharmacy Consult for heparin Indication: atrial fibrillation  No Known Allergies  Patient Measurements: Height: 5\' 1"  (154.9 cm) Weight: 55.6 kg (122 lb 9.2 oz) IBW/kg (Calculated) : 47.8 Heparin Dosing Weight: 55.6kg  Vital Signs: Temp: 98.4 F (36.9 C) (03/11 0340) Temp Source: Axillary (03/11 0340) BP: 110/73 (03/11 0340) Pulse Rate: 96 (03/11 0340)  Labs: Recent Labs    02/03/21 1757 02/03/21 2217 02/04/21 0123  HGB 16.9* 15.6*  --   HCT 54.9* 50.7*  --   PLT 207 178  --   APTT 28  --   --   LABPROT 14.7  --   --   INR 1.2  --   --   CREATININE 3.38* 2.50*  --   TROPONINIHS  --  37* 40*    Estimated Creatinine Clearance: 18.7 mL/min (A) (by C-G formula based on SCr of 2.5 mg/dL (H)).   Medical History: Past Medical History:  Diagnosis Date  . Bipolar affective disorder ([place])   . History of arthritis   . History of chicken pox   . History of depression   . History of genital warts   . history of heart murmur   . History of high blood pressure   . History of thyroid disease   . History of UTI   . Hypertension   . Low TSH level 07/13/2017  . Schizophrenia [facility])      Assessment: 55yo came in w/ c/o failure to thrive and AMS. Per family, pt hasn't eaten/drank X2weeks d/t increasing paranoia.  Pharmacy consulted to dose heparin for AFib, no prior [facility] noted  Baseline labs aPTT 28, PT 14.7, INR 1.2 CBC WNL Pt received 5000 units heparin SQ @ 2236 on 3/10   Goal of Therapy:  Heparin level 0.3-0.7 units/ml Monitor platelets by anticoagulation protocol:   Plan:  Heparin bolus 1000 units x 1 Start heparin drip at 750 units/hr Heparin level in 8 hours Daily cbc   Dolly Rias RPh 02/04/2021, 4:24 AM

## 2021-02-05 DIAGNOSIS — E0590 Thyrotoxicosis, unspecified without thyrotoxic crisis or storm: Secondary | ICD-10-CM | POA: Diagnosis not present

## 2021-02-05 DIAGNOSIS — N179 Acute kidney failure, unspecified: Secondary | ICD-10-CM | POA: Diagnosis not present

## 2021-02-05 DIAGNOSIS — F200 Paranoid schizophrenia: Secondary | ICD-10-CM

## 2021-02-05 DIAGNOSIS — E870 Hyperosmolality and hypernatremia: Secondary | ICD-10-CM | POA: Diagnosis not present

## 2021-02-05 DIAGNOSIS — F3130 Bipolar disorder, current episode depressed, mild or moderate severity, unspecified: Secondary | ICD-10-CM | POA: Diagnosis not present

## 2021-02-05 LAB — CBC
HCT: 47 % — ABNORMAL HIGH (ref 36.0–46.0)
Hemoglobin: 14.5 g/dL (ref 12.0–15.0)
MCH: 27.8 pg (ref 26.0–34.0)
MCHC: 30.9 g/dL (ref 30.0–36.0)
MCV: 90.2 fL (ref 80.0–100.0)
Platelets: 125 10*3/uL — ABNORMAL LOW (ref 150–400)
RBC: 5.21 MIL/uL — ABNORMAL HIGH (ref 3.87–5.11)
RDW: 13.2 % (ref 11.5–15.5)
WBC: 7.6 10*3/uL (ref 4.0–10.5)
nRBC: 0 % (ref 0.0–0.2)

## 2021-02-05 LAB — URINALYSIS, ROUTINE W REFLEX MICROSCOPIC
Bilirubin Urine: NEGATIVE
Glucose, UA: NEGATIVE mg/dL
Hgb urine dipstick: NEGATIVE
Ketones, ur: NEGATIVE mg/dL
Nitrite: NEGATIVE
Protein, ur: NEGATIVE mg/dL
Specific Gravity, Urine: 1.011 (ref 1.005–1.030)
pH: 5 (ref 5.0–8.0)

## 2021-02-05 NOTE — Progress Notes (Signed)
Progress Note  Patient Name: Paula Dickson Date of Encounter: 02/05/2021  [facility] Cardiologist: No primary care provider on file.   Subjective   Refuses to answer questions  Inpatient Medications    Scheduled Meds: . enoxaparin (LOVENOX) injection  60 mg Subcutaneous Q24H  . feeding supplement  237 mL Oral BID BM  . methimazole  10 mg Oral BID  . multivitamin with minerals  1 tablet Oral Daily  . potassium chloride  40 mEq Oral Once  . propranolol  40 mg Oral BID   Continuous Infusions:  PRN Meds: acetaminophen **OR** acetaminophen, haloperidol lactate   Vital Signs    Vitals:   02/04/21 1646 02/04/21 1720 02/04/21 1805 02/05/21 0000  BP:  121/75    Pulse:  72    Resp:  16    Temp: 97.8 F (36.6 C)  98.7 F (37.1 C) (!) 97.3 F (36.3 C)  TempSrc: Axillary   Axillary  SpO2:  100%    Weight:      Height:        Intake/Output Summary (Last 24 hours) at 02/05/2021 0839 Last data filed at 02/05/2021 0003 Gross per 24 hour  Intake 1797.27 ml  Output 200 ml  Net 1597.27 ml   Last 3 Weights 02/03/2021 02/03/2021 02/03/2021  Weight (lbs) 122 lb 9.2 oz 121 lb 4.1 oz 147 lb 11.3 oz  Weight (kg) 55.6 kg 55 kg 67 kg  Some encounter information is confidential and restricted. Go to Review Flowsheets activity to see all data.      Telemetry    Sinus rhythm - Personally Reviewed  ECG    n/a - Personally Reviewed  Physical Exam   VS:  BP 121/75   Pulse 72   Temp (!) 97.3 F (36.3 C) (Axillary)   Resp 16   Ht 5\' 1"  (1.549 m)   Wt 55.6 kg   SpO2 100%   BMI 23.16 kg/m  , BMI Body mass index is 23.16 kg/m. GENERAL:  Appears paranoid HEENT: Pupils equal round and reactive, fundi not visualized, oral mucosa unremarkable NECK:  No jugular venous distention, waveform within normal limits, carotid upstroke brisk and symmetric, no bruits LUNGS:  Clear to auscultation bilaterally HEART:  RRR.  PMI not displaced or sustained,S1 and S2 within normal  limits, no S3, no S4, no clicks, no rubs, no murmurs ABD:  Flat, positive bowel sounds normal in frequency in pitch, no bruits, no rebound, no guarding, no midline pulsatile mass, no hepatomegaly, no splenomegaly EXT:  2 plus pulses throughout, no edema, no cyanosis no clubbing SKIN:  No rashes no nodules NEURO:  Cranial nerves II through XII grossly intact, motor grossly intact throughout PSYCH:  Paranoid.  Guarded affect and refusing to answer questions   Labs    High Sensitivity Troponin:   Recent Labs  Lab 02/03/21 2217 02/04/21 0123  TROPONINIHS 37* 40*      Chemistry Recent Labs  Lab 02/03/21 1757 02/03/21 2217 02/04/21 0426 02/04/21 1003  NA 167*  --  155* 150*  K 4.6  --  3.7 3.3*  CL 128*  --  119* 116*  CO2 19*  --  24 28  GLUCOSE 144*  --  221* 184*  BUN 97*  --  83* 75*  CREATININE 3.38* 2.50* 2.30* 2.03*  CALCIUM 10.6*  --  9.9 9.9  PROT 7.4  --   --   --   ALBUMIN 4.2  --   --   --  AST 11*  --   --   --   ALT 12  --   --   --   ALKPHOS 83  --   --   --   BILITOT 1.1  --   --   --   GFRNONAA 15* 22* 24* 28*  ANIONGAP 20*  --  12 6     Hematology Recent Labs  Lab 02/03/21 1757 02/03/21 2217  WBC 11.0* 12.9*  RBC 5.98* 5.50*  HGB 16.9* 15.6*  HCT 54.9* 50.7*  MCV 91.8 92.2  MCH 28.3 28.4  MCHC 30.8 30.8  RDW 13.7 13.7  PLT 207 178    BNPNo results for input(s): BNP, PROBNP in the last 168 hours.   DDimer No results for input(s): DDIMER in the last 168 hours.   Radiology    DG Chest Port 1 View  Result Date: 02/03/2021 CLINICAL DATA:  Possible sepsis and failure to thrive EXAM: PORTABLE CHEST 1 VIEW COMPARISON:  11/11/2019 FINDINGS: Cardiac shadow is within normal limits. The lungs are clear bilaterally. No bony abnormality is seen. IMPRESSION: No active disease. Electronically Signed   By: Inez Catalina M.D.   On: 02/03/2021 18:48    Cardiac Studies   Echo pending  Patient Profile     55 y.o. female with hypertension,  hyperlipidemia, hyperthyroidism, bipolar disorder/[place] affective disorder with recent psychiatric admission here with new onset atrial fibrillation.  Assessment & Plan    # New onset atrial fibrillation:  Likely attributable to her hyperthyroidism.  She is currently back in sinus rhythm.  Continue management of hyperthyroidism.  She intermittently refuses oral medication so she is anticoagulated with Lovenox.  Continue propranolol.  # Hypertension:  Blood pressure stable after discontinuing the diltiazem drip.  Continue propranolol.  # Hyperlipidemia:  Continue diet/exercise.  # AKI:   Occurred in the setting of refusing foods.  Improving with IVF.  Creatinine is down to 2 from 3.4.  # Bipolardisorder: # Schizoaffective disorder: Management per primary team.  This definitely complicates the management of her atrial fibrillation.  Hopefully it will not be an issue once her thyroid is better regulated.       For questions or updates, please contact [place] Please consult [email or web address] for contact info under        Signed, Skeet Latch, MD  02/05/2021, 8:39 AM

## 2021-02-05 NOTE — Progress Notes (Signed)
PT Cancellation Note  Patient Details Name: Paula Dickson MRN: [id number] DOB: [birth year]   Cancelled Treatment:     Chart reviewed, sitter at patients bedside, pt did wake to voice but adamently refused " no" even when explained the need to begin to move, etc. Continued to state " no" . Nurse reports pt had recently discharged from [facility], so PLOF sounds like it was independent until she has not eaten or taken care of herself in past 2 weeks.   Will continue to try to assess pt and check with nurse.    Clide Dales 02/05/2021, 10:43 AM  Gatha Mayer, PT, MPT Acute Rehabilitation Services Office: [phone] Pager: [phone] 02/05/2021

## 2021-02-05 NOTE — Progress Notes (Signed)
Pt refused physical assessment.

## 2021-02-05 NOTE — Progress Notes (Signed)
PROGRESS NOTE    Paula Dickson  IEP:[id number] DOB: October 25, 1966 DOA: 02/03/2021 PCP: Sandi Mariscal, MD    Chief Complaint  Patient presents with  . Failure to thrive    Brief Narrative:   55 y.o. female with history of bipolar disorder and schizoaffective disorder who was discharged from [facility] psychiatric facility in [place] on January 21, 2021 as per the patient's sister was brought to the ER after patient since discharge from [facility] has not been eating or drinking or taking any of her medication and also has been having some hallucinations.  On arrival to ED she was found to be in atrial fibrillation with RVR started on Cardizem infusion.  She was also started on IV heparin but she refused blood draws to check heparin level since transition to Lovenox injection.     Assessment & Plan:   Principal Problem:   AKI (acute kidney injury) ([place]) Active Problems:   Schizophrenia ([place])   Bipolar disorder, most recent episode depressed ([place])   Hypernatremia   Paroxysmal atrial fibrillation ([place])    AKI with metabolic acidosis and hyper natremia Probably secondary to poor oral intake and free water deficit Improving with IV fluids.  Baseline creatinine less than 1, admitted with a creatinine of 3.3 and currently creatinine is improved to after IV fluids Sodium has been at 167 on admission improved to 150 today. Bicarb levels have improved   Hypokalemia Replace potassium.  Check magnesium and phosphorus levels  Atrial fibrillation with RVR Probably secondary to hyperthyroidism Was started on Cardizem gtt. transition to propranolol by cardiology.  Echocardiogram ordered and pending. She was started on IV heparin for anticoagulation, refusing blood draws to check heparin levels hence was transitioned to Lovenox injections. CHA2DS2-VASc score is 2. Cardiology on board.     History of bipolar disorder and schizophrenia  patient was on risperidone clozapine,  carbamazepine, Restoril, trazodone, Geodon at home.  Since discharge from the psychiatric facility she has not been taking any of her meds. She was started on IV Haldol for agitation.  Psychiatric consulted, suggest her presentation is most likely secondary to hyperthyroidism.  They recommend to continue medications that she was previously discharged on from [facility] psychiatric facility 2 weeks ago.  On further talking to her sister she reports that people from [facility] psychiatric facility said that she was not getting better and hence they discharged her home.     Hyperthyroidism She has multinodular goiter on ultrasound of the thyroid from December 2021,. Lab work reveal depressed TSH and elevated free T4, free T3 levels are pending at this time.  Thyroid-stimulating antibodies are are negative from December 2021. Thyrotropin receptor antibodies are ordered and pending She does not appear to be in thyrotoxicosis, currently her heart rate is between 80 to 100/min,  blood pressure appears to be optimal. She will be started on methimazole 10 mg BID.  She is currently refusing her meds, She will need endocrinology appointment/follow-up on discharge.   Of note patient has been refusing meds, blood draws, vitals. Had to give her haldol and give her meds.  Will continue to monitor.   DVT prophylaxis: (Lovenox) Code Status: (Full code) Family Communication: discussed with sister over the phone.  Disposition:   Status is: Inpatient  Remains inpatient appropriate because:Ongoing diagnostic testing needed not appropriate for outpatient work up and IV treatments appropriate due to intensity of illness or inability to take PO   Dispo: The patient is from: Home  Anticipated d/c is to: pending.               Patient currently is not medically stable to d/c.   Difficult to place patient No       Consultants:   Psychiatry  CARDIOLOGY.   Procedures:  none  Antimicrobials: NONE    Subjective: Did not allow ot be examined.  Objective: Vitals:   02/04/21 1646 02/04/21 1720 02/04/21 1805 02/05/21 0000  BP:  121/75    Pulse:  72    Resp:  16    Temp: 97.8 F (36.6 C)  98.7 F (37.1 C) (!) 97.3 F (36.3 C)  TempSrc: Axillary   Axillary  SpO2:  100%    Weight:      Height:        Intake/Output Summary (Last 24 hours) at 02/05/2021 1350 Last data filed at 02/05/2021 1006 Gross per 24 hour  Intake 2277.27 ml  Output 200 ml  Net 2077.27 ml   Filed Weights   02/03/21 1711 02/03/21 2044 02/03/21 2322  Weight: 67 kg 55 kg 55.6 kg    Examination: Pt pushed me away when we tried to examine her.     Data Reviewed: I have personally reviewed following labs and imaging studies  CBC: Recent Labs  Lab 02/03/21 1757 02/03/21 2217 02/05/21 1004  WBC 11.0* 12.9* 7.6  NEUTROABS 8.1*  --   --   HGB 16.9* 15.6* 14.5  HCT 54.9* 50.7* 47.0*  MCV 91.8 92.2 90.2  PLT 207 178 125*    Basic Metabolic Panel: Recent Labs  Lab 02/03/21 1757 02/03/21 2217 02/04/21 0426 02/04/21 0956 02/04/21 1003  NA 167*  --  155*  --  150*  K 4.6  --  3.7  --  3.3*  CL 128*  --  119*  --  116*  CO2 19*  --  24  --  28  GLUCOSE 144*  --  221*  --  184*  BUN 97*  --  83*  --  75*  CREATININE 3.38* 2.50* 2.30*  --  2.03*  CALCIUM 10.6*  --  9.9  --  9.9  MG  --   --   --  2.5*  --   PHOS  --   --   --  2.3*  --     GFR: Estimated Creatinine Clearance: 23.1 mL/min (A) (by C-G formula based on SCr of 2.03 mg/dL (H)).  Liver Function Tests: Recent Labs  Lab 02/03/21 1757  AST 11*  ALT 12  ALKPHOS 83  BILITOT 1.1  PROT 7.4  ALBUMIN 4.2    CBG: No results for input(s): GLUCAP in the last 168 hours.   Recent Results (from the past 240 hour(s))  SARS CORONAVIRUS 2 (TAT 6-24 HRS) Nasopharyngeal Nasopharyngeal Swab     Status: None   Collection Time: 02/03/21  7:00 PM   Specimen: Nasopharyngeal Swab  Result Value Ref Range Status    SARS Coronavirus 2 NEGATIVE NEGATIVE Final    Comment: (NOTE) SARS-CoV-2 target nucleic acids are NOT DETECTED.  The SARS-CoV-2 RNA is generally detectable in upper and lower respiratory specimens during the acute phase of infection. Negative results do not preclude SARS-CoV-2 infection, do not rule out co-infections with other pathogens, and should not be used as the sole basis for treatment or other patient management decisions. Negative results must be combined with clinical observations, patient history, and epidemiological information. The expected result is Negative.  Fact Sheet for Patients: SugarRoll.be  Fact Sheet for Healthcare Providers: https://www.woods-mathews.com/  This test is not yet approved or cleared by the [place] FDA and  has been authorized for detection and/or diagnosis of SARS-CoV-2 by FDA under an Emergency Use Authorization (EUA). This EUA will remain  in effect (meaning this test can be used) for the duration of the COVID-19 declaration under Se ction 564(b)(1) of the Act, 21 U.S.C. section 360bbb-3(b)(1), unless the authorization is terminated or revoked sooner.  Performed at [facility], [address] [place], [place] [address]          Radiology Studies: DG Chest Port 1 View  Result Date: 02/03/2021 CLINICAL DATA:  Possible sepsis and failure to thrive EXAM: PORTABLE CHEST 1 VIEW COMPARISON:  11/11/2019 FINDINGS: Cardiac shadow is within normal limits. The lungs are clear bilaterally. No bony abnormality is seen. IMPRESSION: No active disease. Electronically Signed   By: Inez Catalina M.D.   On: 02/03/2021 18:48        Scheduled Meds: . enoxaparin (LOVENOX) injection  60 mg Subcutaneous Q24H  . feeding supplement  237 mL Oral BID BM  . methimazole  10 mg Oral BID  . multivitamin with minerals  1 tablet Oral Daily  . potassium chloride  40 mEq Oral Once  . propranolol  40 mg Oral  BID   Continuous Infusions:    LOS: 2 days        Hosie Poisson, MD [facility]   To contact the attending provider between 7A-7P or the covering provider during after hours 7P-7A, please log into the web site [email or web address] and access using universal [place] password for that web site. If you do not have the password, please call the hospital operator.  02/05/2021, 1:50 PM

## 2021-02-06 ENCOUNTER — Inpatient Hospital Stay (HOSPITAL_COMMUNITY): Payer: 59

## 2021-02-06 ENCOUNTER — Other Ambulatory Visit (HOSPITAL_COMMUNITY): Payer: 59

## 2021-02-06 DIAGNOSIS — I4891 Unspecified atrial fibrillation: Secondary | ICD-10-CM

## 2021-02-06 DIAGNOSIS — F3130 Bipolar disorder, current episode depressed, mild or moderate severity, unspecified: Secondary | ICD-10-CM | POA: Diagnosis not present

## 2021-02-06 DIAGNOSIS — E870 Hyperosmolality and hypernatremia: Secondary | ICD-10-CM | POA: Diagnosis not present

## 2021-02-06 DIAGNOSIS — E0590 Thyrotoxicosis, unspecified without thyrotoxic crisis or storm: Secondary | ICD-10-CM | POA: Diagnosis not present

## 2021-02-06 DIAGNOSIS — N179 Acute kidney failure, unspecified: Secondary | ICD-10-CM | POA: Diagnosis not present

## 2021-02-06 DIAGNOSIS — I480 Paroxysmal atrial fibrillation: Secondary | ICD-10-CM | POA: Diagnosis not present

## 2021-02-06 LAB — BASIC METABOLIC PANEL
Anion gap: 9 (ref 5–15)
BUN: 28 mg/dL — ABNORMAL HIGH (ref 6–20)
CO2: 28 mmol/L (ref 22–32)
Calcium: 9.8 mg/dL (ref 8.9–10.3)
Chloride: 116 mmol/L — ABNORMAL HIGH (ref 98–111)
Creatinine, Ser: 1.13 mg/dL — ABNORMAL HIGH (ref 0.44–1.00)
GFR, Estimated: 57 mL/min — ABNORMAL LOW (ref >60)
Glucose, Bld: 148 mg/dL — ABNORMAL HIGH (ref 70–99)
Potassium: 3.5 mmol/L (ref 3.5–5.1)
Sodium: 153 mmol/L — ABNORMAL HIGH (ref 135–145)

## 2021-02-06 LAB — URINE CULTURE: Culture: NO GROWTH

## 2021-02-06 LAB — CBC
HCT: 40.6 % (ref 36.0–46.0)
Hemoglobin: 12.8 g/dL (ref 12.0–15.0)
MCH: 28.3 pg (ref 26.0–34.0)
MCHC: 31.5 g/dL (ref 30.0–36.0)
MCV: 89.6 fL (ref 80.0–100.0)
Platelets: 112 10*3/uL — ABNORMAL LOW (ref 150–400)
RBC: 4.53 MIL/uL (ref 3.87–5.11)
RDW: 13.1 % (ref 11.5–15.5)
WBC: 4.9 10*3/uL (ref 4.0–10.5)
nRBC: 0 % (ref 0.0–0.2)

## 2021-02-06 LAB — ECHOCARDIOGRAM COMPLETE
Area-P 1/2: 2.48 cm2
Calc EF: 73 %
Height: 61 in
S' Lateral: 1.5 cm
Single Plane A2C EF: 81.9 %
Single Plane A4C EF: 61.3 %
Weight: 1961.21 oz

## 2021-02-06 MED ORDER — ENOXAPARIN SODIUM 60 MG/0.6ML ~~LOC~~ SOLN
60.0000 mg | Freq: Two times a day (BID) | SUBCUTANEOUS | Status: DC
  Administered 2021-02-06 – 2021-02-07 (×3): 60 mg via SUBCUTANEOUS
  Filled 2021-02-06 (×3): qty 0.6

## 2021-02-06 MED ORDER — DEXTROSE 5 % IV SOLN: INTRAVENOUS | Status: DC

## 2021-02-06 NOTE — Plan of Care (Signed)
  Problem: Safety: Goal: Ability to remain free from injury will improve Outcome: Progressing   Problem: Skin Integrity: Goal: Risk for impaired skin integrity will decrease Outcome: Progressing   

## 2021-02-06 NOTE — Evaluation (Signed)
Physical Therapy Evaluation Patient Details Name: Paula Dickson MRN: [id number] DOB: 1966/04/06 Today's Date: 02/06/2021   History of Present Illness  Pt admitted from home with FTT - not eating and staying in bed.  Pt with hx of bipolar and schizophrenia and dc from [facility] in [place] in February  Clinical Impression  Pt admitted 2* FTT and presenting with functional mobility limitations 2* generalized weakness, ambulatory balance deficits and limited drive to self-assist and self-care.  This date, pt requiring ++encouragement to gain participation and on initial attempt to stand buckling both knees to avoid participation.  With second attempt, pt up with min assist and ambulated full circuit of hall with progressively improving balance noted.  Pt would benefit from continued PT to maximize IND and safety and from 24/7 supervision/assist following dc.    Follow Up Recommendations SNF;Home health PT (dependent on level of assist available at home)    Equipment Recommendations  None recommended by PT    Recommendations for Other Services OT consult     Precautions / Restrictions Precautions Precautions: Fall Precaution Comments: Pt reluctant to get OOB and initially buckling both knees to avoid Restrictions Weight Bearing Restrictions: No      Mobility  Bed Mobility Overal bed mobility: Needs Assistance Bed Mobility: Supine to Sit;Sit to Supine     Supine to sit: Min guard Sit to supine: Modified independent (Device/Increase time)   General bed mobility comments: min guard to prevent pt from returning to bed immediately after sitting    Transfers Overall transfer level: Needs assistance Equipment used: None;2 person hand held assist Transfers: Sit to/from Stand Sit to Stand: Mod assist;Min assist;+2 physical assistance;+2 safety/equipment         General transfer comment: On first attempt to stand pt attempting to buckle bil knees to avoid participation.  On  second attempt min assist for balance only  Ambulation/Gait Ambulation/Gait assistance: Min assist Gait Distance (Feet): 400 Feet Assistive device: IV Pole;1 person hand held assist Gait Pattern/deviations: Step-through pattern;Decreased step length - right;Decreased step length - left;Shuffle;Staggering left;Staggering right;Trunk flexed;Narrow base of support     General Gait Details: Generalized instability in all directions but with continuous improvement with increased distance ambulated.  Stairs            Wheelchair Mobility    Modified Rankin (Stroke Patients Only)       Balance Overall balance assessment: Needs assistance Sitting-balance support: No upper extremity supported;Feet supported Sitting balance-Leahy Scale: Good     Standing balance support: Single extremity supported Standing balance-Leahy Scale: Poor                               Pertinent Vitals/Pain Pain Assessment: No/denies pain    Home Living Family/patient expects to be discharged to:: Unsure Living Arrangements: Alone Available Help at Discharge: Family;Available PRN/intermittently Type of Home: House Home Access: Stairs to enter Entrance Stairs-Rails: Right;Left;Can reach both Entrance Stairs-Number of Steps: 5 Home Layout: One level Home Equipment: None      Prior Function Level of Independence: Independent         Comments: But has not been eating and largely staying in bed recently     Hand Dominance   Dominant Hand: Right    Extremity/Trunk Assessment   Upper Extremity Assessment Upper Extremity Assessment: Generalized weakness    Lower Extremity Assessment Lower Extremity Assessment: Generalized weakness    Cervical / Trunk Assessment Cervical /  Trunk Assessment: Normal  Communication   Communication: No difficulties  Cognition Arousal/Alertness: Awake/alert Behavior During Therapy: WFL for tasks assessed/performed;Flat affect Overall  Cognitive Status: Within Functional Limits for tasks assessed                                 General Comments: Pt following cues but requiring encouragement to participate      General Comments      Exercises     Assessment/Plan    PT Assessment Patient needs continued PT services  PT Problem List Decreased strength;Decreased activity tolerance;Decreased balance;Decreased mobility;Decreased cognition;Decreased knowledge of use of DME;Decreased safety awareness       PT Treatment Interventions DME instruction;Gait training;Stair training;Functional mobility training;Therapeutic activities;Therapeutic exercise;Balance training;Patient/family education    PT Goals (Current goals can be found in the Care Plan section)  Acute Rehab PT Goals Patient Stated Goal: Get back to bed PT Goal Formulation: With patient Time For Goal Achievement: 02/20/21 Potential to Achieve Goals: Fair    Frequency Min 3X/week   Barriers to discharge Decreased caregiver support Pt lives alone    Co-evaluation               AM-PAC PT "6 Clicks" Mobility  Outcome Measure Help needed turning from your back to your side while in a flat bed without using bedrails?: None Help needed moving from lying on your back to sitting on the side of a flat bed without using bedrails?: A Little Help needed moving to and from a bed to a chair (including a wheelchair)?: A Little Help needed standing up from a chair using your arms (e.g., wheelchair or bedside chair)?: A Lot Help needed to walk in hospital room?: A Little Help needed climbing 3-5 steps with a railing? : A Lot 6 Click Score: 17    End of Session Equipment Utilized During Treatment: Gait belt Activity Tolerance: Patient tolerated treatment well;Patient limited by fatigue Patient left: in bed;with call bell/phone within reach;with nursing/sitter in room Nurse Communication: Mobility status PT Visit Diagnosis: Difficulty in walking,  not elsewhere classified (R26.2);Muscle weakness (generalized) (M62.81)    Time: 6160-7371 PT Time Calculation (min) (ACUTE ONLY): 20 min   Charges:   PT Evaluation $PT Eval Low Complexity: 1 Low          [place] Pager [phone] Office [phone]   Miniya Miguez 02/06/2021, 2:26 PM

## 2021-02-06 NOTE — Progress Notes (Signed)
Progress Note  Patient Name: Paula Dickson Date of Encounter: 02/06/2021  [facility] Cardiologist: No primary care provider on file.   Subjective   Refuses to answer questions  Inpatient Medications    Scheduled Meds: . enoxaparin (LOVENOX) injection  60 mg Subcutaneous Q24H  . feeding supplement  237 mL Oral BID BM  . methimazole  10 mg Oral BID  . multivitamin with minerals  1 tablet Oral Daily  . potassium chloride  40 mEq Oral Once  . propranolol  40 mg Oral BID   Continuous Infusions:  PRN Meds: acetaminophen **OR** acetaminophen, haloperidol lactate   Vital Signs    Vitals:   02/05/21 1647 02/05/21 2000 02/06/21 0609 02/06/21 0614  BP:      Pulse:  88    Resp:      Temp: 98.5 F (36.9 C) 98.6 F (37 C) 98.5 F (36.9 C) 98.5 F (36.9 C)  TempSrc: Oral Axillary  Oral  SpO2:  100%    Weight:      Height:        Intake/Output Summary (Last 24 hours) at 02/06/2021 0915 Last data filed at 02/06/2021 0726 Gross per 24 hour  Intake 1560 ml  Output 150 ml  Net 1410 ml   Last 3 Weights 02/03/2021 02/03/2021 02/03/2021  Weight (lbs) 122 lb 9.2 oz 121 lb 4.1 oz 147 lb 11.3 oz  Weight (kg) 55.6 kg 55 kg 67 kg  Some encounter information is confidential and restricted. Go to Review Flowsheets activity to see all data.      Telemetry    Sinus rhythm - Personally Reviewed  ECG    n/a - Personally Reviewed  Physical Exam   VS:  BP 121/74 (BP Location: Left Arm)   Pulse 88   Temp 98.5 F (36.9 C) (Oral)   Resp 13   Ht 5\' 1"  (1.549 m)   Wt 55.6 kg   SpO2 100%   BMI 23.16 kg/m  , BMI Body mass index is 23.16 kg/m. GENERAL:  Appears paranoid HEENT: Pupils equal round and reactive, fundi not visualized, oral mucosa unremarkable NECK:  No jugular venous distention, waveform within normal limits, carotid upstroke brisk and symmetric, no bruits LUNGS:  Clear to auscultation bilaterally HEART:  RRR.  PMI not displaced or sustained,S1 and S2  within normal limits, no S3, no S4, no clicks, no rubs, no murmurs ABD:  Flat, positive bowel sounds normal in frequency in pitch, no bruits, no rebound, no guarding, no midline pulsatile mass, no hepatomegaly, no splenomegaly EXT:  2 plus pulses throughout, no edema, no cyanosis no clubbing SKIN:  No rashes no nodules NEURO:  Cranial nerves II through XII grossly intact, motor grossly intact throughout PSYCH:  Paranoid.  Guarded affect and refusing to answer questions   Labs    High Sensitivity Troponin:   Recent Labs  Lab 02/03/21 2217 02/04/21 0123  TROPONINIHS 37* 40*      Chemistry Recent Labs  Lab 02/03/21 1757 02/03/21 2217 02/04/21 0426 02/04/21 1003  NA 167*  --  155* 150*  K 4.6  --  3.7 3.3*  CL 128*  --  119* 116*  CO2 19*  --  24 28  GLUCOSE 144*  --  221* 184*  BUN 97*  --  83* 75*  CREATININE 3.38* 2.50* 2.30* 2.03*  CALCIUM 10.6*  --  9.9 9.9  PROT 7.4  --   --   --   ALBUMIN 4.2  --   --   --  AST 11*  --   --   --   ALT 12  --   --   --   ALKPHOS 83  --   --   --   BILITOT 1.1  --   --   --   GFRNONAA 15* 22* 24* 28*  ANIONGAP 20*  --  12 6     Hematology Recent Labs  Lab 02/03/21 2217 02/05/21 1004 02/06/21 0452  WBC 12.9* 7.6 4.9  RBC 5.50* 5.21* 4.53  HGB 15.6* 14.5 12.8  HCT 50.7* 47.0* 40.6  MCV 92.2 90.2 89.6  MCH 28.4 27.8 28.3  MCHC 30.8 30.9 31.5  RDW 13.7 13.2 13.1  PLT 178 125* 112*    BNPNo results for input(s): BNP, PROBNP in the last 168 hours.   DDimer No results for input(s): DDIMER in the last 168 hours.   Radiology    No results found.  Cardiac Studies   Echo pending  Patient Profile     55 y.o. female with hypertension, hyperlipidemia, hyperthyroidism, bipolar disorder/[place] affective disorder with recent psychiatric admission here with new onset atrial fibrillation.  Assessment & Plan    # New onset atrial fibrillation:  Likely attributable to her hyperthyroidism.  She is currently back in sinus  rhythm. Continue management of hyperthyroidism.  She intermittently refuses oral medication so she is anticoagulated with Lovenox.  Continue propranolol.   Echo pending.  If she has no structural heart disease and left atrium is normal in size, her likelihood for recurrent atrial fibrillation once she is euthyroid is lower.  This could be factored into her need for long term anticoagulation given the difficulty with getting her to take oral medications. Would at least continue anticoagulation until she is euthyroid.  # Hypertension:  Blood pressure stable after discontinuing the diltiazem drip.  Continue propranolol.  # Hyperlipidemia:  Continue diet/exercise.  # AKI:   Occurred in the setting of refusing foods.  Improving with IVF.  Creatinine is down to 2 from 3.4.  # Bipolardisorder: # Schizoaffective disorder: Management per primary team.  This definitely complicates the management of her atrial fibrillation.  Hopefully it will not be an issue once her thyroid is better regulated.  [facility] will sign off.   Medication Recommendations:  Transition to oral anticoagulation  Other recommendations (labs, testing, etc):  none Follow up as an outpatient:  Within 1 month of discharge       For questions or updates, please contact [place] Please consult [email or web address] for contact info under        Signed, Skeet Latch, MD  02/06/2021, 9:15 AM

## 2021-02-06 NOTE — Progress Notes (Signed)
Pt was followed by [facility] clinic for cardiology, last seen in 2018. She would be new to Dr Elisha Headland she choose to follow with [facility]. Can be arranged once disposition is determined closer to discharge.

## 2021-02-06 NOTE — Progress Notes (Signed)
  Echocardiogram 2D Echocardiogram has been performed.  Paula Dickson 02/06/2021, 10:01 AM

## 2021-02-06 NOTE — Progress Notes (Signed)
Palliative Care Brief note  Palliative consult received.  Discussed with Dr. Karleen Hampshire.  Ms. Manternach has been showing signs of clinical improvement and has been more participative in care.    Will hold on palliative consult at this time.  Please call or reconsult if we can be of assistance in the care of Paula Dickson moving forward.  Micheline Rough, MD [facility] Team [phone]  NO CHARGE NOTE

## 2021-02-06 NOTE — Progress Notes (Signed)
PROGRESS NOTE    Paula Dickson  DQQ:[id number] DOB: 02-Nov-1966 DOA: 02/03/2021 PCP: Sandi Mariscal, MD    Chief Complaint  Patient presents with  . Failure to thrive    Brief Narrative:   55 y.o. female with history of bipolar disorder and schizoaffective disorder who was discharged from [facility] psychiatric facility in [place] on January 21, 2021 as per the patient's sister was brought to the ER after patient since discharge from [facility] has not been eating or drinking or taking any of her medication and also has been having some hallucinations.  On arrival to ED she was found to be in atrial fibrillation with RVR started on Cardizem infusion.  She was also started on IV heparin but she refused blood draws to check heparin level since transition to Lovenox injection. Pt seen and examined at bedside, she took her meds this am, able to follow simple commands , agreed to get echocardiogram done, partially.  No new complaints. No agitation.      Assessment & Plan:   Principal Problem:   AKI (acute kidney injury) ([place]) Active Problems:   Schizophrenia ([place])   Bipolar disorder, most recent episode depressed ([place])   Hypernatremia   Paroxysmal atrial fibrillation ([place])    AKI with metabolic acidosis and hyper natremia Probably secondary to poor oral intake and free water deficit Improving with IV fluids.  Baseline creatinine less than 1, admitted with a creatinine of 3.3 and currently creatinine is 1.1,  improvedafter IV fluids Sodium has been at 167 on admission improved to 153 today. Bicarb levels have improved Recommend to continue with dextrose fluids for another 24 hours , until sodium normalizes.   Hypokalemia Replace potassium.  Check magnesium and phosphorus levels in am.   Atrial fibrillation with RVR Probably secondary to hyperthyroidism Was started on Cardizem gtt. transition to propranolol by cardiology.  Echocardiogram ordered, done today, results are  pending.  She was started on IV heparin for anticoagulation, refusing blood draws to check heparin levels hence was transitioned to Lovenox injections. CHA2DS2-VASc score is 2. Cardiology on board.     History of bipolar disorder and schizophrenia Patient was on risperidone clozapine, carbamazepine, Restoril, trazodone, Geodon at home.  Since discharge from the psychiatric facility,  she has not been taking any of her meds. She was started on IV Haldol for agitation.  Psychiatric consulted, suggest her presentation is most likely secondary to hyperthyroidism.  They recommend to continue medications that she was previously discharged on from [facility] psychiatric facility 2 weeks ago.  On further talking to her sister she reports that people from [facility] psychiatric facility said that she was not getting better and hence they discharged her home. Will slowly restart her medications.   Thrombocytopenia Platelets dropped from 205000  To 112000. No bleeding seen. If continues to drop , will stop the lovenox injections.    Hyperthyroidism She has multinodular goiter on ultrasound of the thyroid from December 2021,. Lab work reveal depressed TSH and elevated free T4, free T3 levels are pending at this time.  Thyroid-stimulating antibodies are are negative from December 2021. Thyrotropin receptor antibodies are ordered and pending She does not appear to be in thyrotoxicosis, currently her heart rate is between 80 to 100/min,  blood pressure appears to be optimal. She will be started on methimazole 10 mg BID. Took her meds yesterday evening and this morning.  She will need endocrinology appointment/follow-up on discharge.    DVT prophylaxis: (Lovenox) Code Status: (  Full code) Family Communication: discussed with sister over the phone.  Disposition:   Status is: Inpatient  Remains inpatient appropriate because:Ongoing diagnostic testing needed not appropriate for outpatient work up and  IV treatments appropriate due to intensity of illness or inability to take PO   Dispo: The patient is from: Home              Anticipated d/c is to: pending.               Patient currently is not medically stable to d/c.   Difficult to place patient No       Consultants:   Psychiatry  CARDIOLOGY.   Procedures: none  Antimicrobials: NONE    Subjective: Alert and appears comfortable. No distress noted.   Objective: Vitals:   02/06/21 0614 02/06/21 0800 02/06/21 1200 02/06/21 1328  BP:  120/81 (!) 153/82   Pulse:  78 96   Resp:  20 16   Temp: 98.5 F (36.9 C)   98.9 F (37.2 C)  TempSrc: Oral   Oral  SpO2:  96% 99%   Weight:      Height:        Intake/Output Summary (Last 24 hours) at 02/06/2021 1422 Last data filed at 02/06/2021 1246 Gross per 24 hour  Intake 1800 ml  Output 150 ml  Net 1650 ml   Filed Weights   02/03/21 1711 02/03/21 2044 02/03/21 2322  Weight: 67 kg 55 kg 55.6 kg    Examination:  General Patient alert, comfortable not in any kind of distress CVS S1-S2 heard, irregularly irregular, no JVD Lungs air entry fair bilateral no wheezing heard Abdomen is soft, nontender nondistended bowel sounds normal Extremities no pedal edema Skin no rashes   Data Reviewed: I have personally reviewed following labs and imaging studies  CBC: Recent Labs  Lab 02/03/21 1757 02/03/21 2217 02/05/21 1004 02/06/21 0452  WBC 11.0* 12.9* 7.6 4.9  NEUTROABS 8.1*  --   --   --   HGB 16.9* 15.6* 14.5 12.8  HCT 54.9* 50.7* 47.0* 40.6  MCV 91.8 92.2 90.2 89.6  PLT 207 178 125* 112*    Basic Metabolic Panel: Recent Labs  Lab 02/03/21 1757 02/03/21 2217 02/04/21 0426 02/04/21 0956 02/04/21 1003 02/06/21 1022  NA 167*  --  155*  --  150* 153*  K 4.6  --  3.7  --  3.3* 3.5  CL 128*  --  119*  --  116* 116*  CO2 19*  --  24  --  28 28  GLUCOSE 144*  --  221*  --  184* 148*  BUN 97*  --  83*  --  75* 28*  CREATININE 3.38* 2.50* 2.30*  --  2.03*  1.13*  CALCIUM 10.6*  --  9.9  --  9.9 9.8  MG  --   --   --  2.5*  --   --   PHOS  --   --   --  2.3*  --   --     GFR: Estimated Creatinine Clearance: 41.4 mL/min (A) (by C-G formula based on SCr of 1.13 mg/dL (H)).  Liver Function Tests: Recent Labs  Lab 02/03/21 1757  AST 11*  ALT 12  ALKPHOS 83  BILITOT 1.1  PROT 7.4  ALBUMIN 4.2    CBG: No results for input(s): GLUCAP in the last 168 hours.   Recent Results (from the past 240 hour(s))  Urine culture     Status: None  Collection Time: 02/03/21  5:48 PM   Specimen: In/Out Cath Urine  Result Value Ref Range Status   Specimen Description   Final    IN/OUT CATH URINE Performed at [facility], [place] [address] [place], [place] [address]    Special Requests   Final    NONE Performed at [facility], [place] [address], [place], [place] [address]    Culture   Final    NO GROWTH Performed at [facility], [place] [address], [place], [place] [address]    Report Status 02/06/2021 FINAL  Final  SARS CORONAVIRUS 2 (TAT 6-24 HRS) Nasopharyngeal Nasopharyngeal Swab     Status: None   Collection Time: 02/03/21  7:00 PM   Specimen: Nasopharyngeal Swab  Result Value Ref Range Status   SARS Coronavirus 2 NEGATIVE NEGATIVE Final    Comment: (NOTE) SARS-CoV-2 target nucleic acids are NOT DETECTED.  The SARS-CoV-2 RNA is generally detectable in upper and lower respiratory specimens during the acute phase of infection. Negative results do not preclude SARS-CoV-2 infection, do not rule out co-infections with other pathogens, and should not be used as the sole basis for treatment or other patient management decisions. Negative results must be combined with clinical observations, patient history, and epidemiological information. The expected result is Negative.  Fact Sheet for Patients: SugarRoll.be  Fact Sheet for Healthcare  Providers: https://www.woods-mathews.com/  This test is not yet approved or cleared by the [place] FDA and  has been authorized for detection and/or diagnosis of SARS-CoV-2 by FDA under an Emergency Use Authorization (EUA). This EUA will remain  in effect (meaning this test can be used) for the duration of the COVID-19 declaration under Se ction 564(b)(1) of the Act, 21 U.S.C. section 360bbb-3(b)(1), unless the authorization is terminated or revoked sooner.  Performed at [facility], [place] [address], [place], [place] [address]          Radiology Studies: ECHOCARDIOGRAM COMPLETE  Result Date: 02/06/2021    ECHOCARDIOGRAM REPORT   Patient Name:   Paula Dickson Date of Exam: 02/06/2021 Medical Rec #:  [id number]             Height:       61.0 in Accession #:    [id number]            Weight:       122.6 lb Date of Birth:  Oct 22, 1966             BSA:          1.534 m Patient Age:    55 years              BP:           121/74 mmHg Patient Gender: F                     HR:           73 bpm. Exam Location:  Inpatient Procedure: 2D Echo, Cardiac Doppler and Color Doppler Indications:    I48.91* Unspeicified atrial fibrillation  History:        Patient has no prior history of Echocardiogram examinations.                 Signs/Symptoms:Altered Mental Status; Risk Factors:Hypertension                 and Dyslipidemia. Schizoaffective disorder.  Sonographer:    Roseanna Rainbow RDCS Referring Phys: [phone]  Doreatha Lew [facility]  Sonographer Comments: Technically difficult study due to poor echo windows. Image acquisition challenging due to uncooperative patient. On initial encounter for exam, patient refused to continue at [place]. I ended exam and left the room. Nursing staff insisted that I try again to finish exam with sitter present in room. I then set up machine again, and continued test. Patient had trouble holding breath and following directions. Patient moved and had to be coached  countless times. Patient pushed probe away 3 times. IMPRESSIONS  1. Left ventricular ejection fraction, by estimation, is 70 to 75%. The left ventricle has hyperdynamic function. The left ventricle has no regional wall motion abnormalities. Left ventricular diastolic function could not be evaluated.  2. Right ventricular systolic function is normal. The right ventricular size is normal. Tricuspid regurgitation signal is inadequate for assessing PA pressure.  3. The mitral valve is grossly normal. No evidence of mitral valve regurgitation. No evidence of mitral stenosis.  4. The aortic valve is tricuspid. Aortic valve regurgitation is not visualized. No aortic stenosis is present.  5. The inferior vena cava is normal in size with greater than 50% respiratory variability, suggesting right atrial pressure of 3 mmHg. FINDINGS  Left Ventricle: Left ventricular ejection fraction, by estimation, is 70 to 75%. The left ventricle has hyperdynamic function. The left ventricle has no regional wall motion abnormalities. The left ventricular internal cavity size was normal in size. There is no left ventricular hypertrophy. Left ventricular diastolic function could not be evaluated due to atrial fibrillation. Left ventricular diastolic function could not be evaluated. Right Ventricle: The right ventricular size is normal. No increase in right ventricular wall thickness. Right ventricular systolic function is normal. Tricuspid regurgitation signal is inadequate for assessing PA pressure. Left Atrium: Left atrial size was normal in size. Right Atrium: Right atrial size was normal in size. Pericardium: Trivial pericardial effusion is present. Mitral Valve: The mitral valve is grossly normal. No evidence of mitral valve regurgitation. No evidence of mitral valve stenosis. Tricuspid Valve: The tricuspid valve is grossly normal. Tricuspid valve regurgitation is trivial. No evidence of tricuspid stenosis. Aortic Valve: The aortic valve  is tricuspid. Aortic valve regurgitation is not visualized. No aortic stenosis is present. Pulmonic Valve: The pulmonic valve was grossly normal. Pulmonic valve regurgitation is not visualized. No evidence of pulmonic stenosis. Aorta: The aortic root and ascending aorta are structurally normal, with no evidence of dilitation. Venous: The inferior vena cava is normal in size with greater than 50% respiratory variability, suggesting right atrial pressure of 3 mmHg. IAS/Shunts: The atrial septum is grossly normal.  LEFT VENTRICLE PLAX 2D LVIDd:         2.90 cm     Diastology LVIDs:         1.50 cm     LV e' medial:    9.57 cm/s LV PW:         1.17 cm     LV E/e' medial:  5.4 LV IVS:        1.20 cm     LV e' lateral:   11.00 cm/s LVOT diam:     1.70 cm     LV E/e' lateral: 4.7 LV SV:         47 LV SV Index:   30 LVOT Area:     2.27 cm  LV Volumes (MOD) LV vol d, MOD A2C: 26.8 ml LV vol d, MOD A4C: 57.9 ml LV vol s, MOD A2C: 4.9 ml LV vol  s, MOD A4C: 22.4 ml LV SV MOD A2C:     22.0 ml LV SV MOD A4C:     57.9 ml LV SV MOD BP:      29.8 ml RIGHT VENTRICLE             IVC RV S prime:     16.80 cm/s  IVC diam: 0.80 cm TAPSE (M-mode): 1.6 cm LEFT ATRIUM             Index       RIGHT ATRIUM           Index LA diam:        3.30 cm 2.15 cm/m  RA Area:     10.30 cm LA Vol (A2C):   5.4 ml  3.55 ml/m  RA Volume:   19.00 ml  12.39 ml/m LA Vol (A4C):   17.1 ml 11.15 ml/m LA Biplane Vol: 9.5 ml  6.21 ml/m  AORTIC VALVE LVOT Vmax:   155.00 cm/s LVOT Vmean:  83.200 cm/s LVOT VTI:    0.206 m  AORTA Ao Root diam: 2.90 cm Ao Asc diam:  2.80 cm MITRAL VALVE MV Area (PHT): 2.48 cm    SHUNTS MV Decel Time: 306 msec    Systemic VTI:  0.21 m MV E velocity: 52.10 cm/s  Systemic Diam: 1.70 cm MV A velocity: 84.60 cm/s MV E/A ratio:  0.62 Eleonore Chiquito MD Electronically signed by Eleonore Chiquito MD Signature Date/Time: 02/06/2021/12:18:28 PM    Final         Scheduled Meds: . enoxaparin (LOVENOX) injection  60 mg Subcutaneous BID  .  feeding supplement  237 mL Oral BID BM  . methimazole  10 mg Oral BID  . multivitamin with minerals  1 tablet Oral Daily  . potassium chloride  40 mEq Oral Once  . propranolol  40 mg Oral BID   Continuous Infusions: . dextrose 75 mL/hr at 02/06/21 1217     LOS: 3 days        Hosie Poisson, MD [facility]   To contact the attending provider between 7A-7P or the covering provider during after hours 7P-7A, please log into the web site [email or web address] and access using universal [place] password for that web site. If you do not have the password, please call the hospital operator.  02/06/2021, 2:22 PM

## 2021-02-07 DIAGNOSIS — E0590 Thyrotoxicosis, unspecified without thyrotoxic crisis or storm: Secondary | ICD-10-CM | POA: Diagnosis not present

## 2021-02-07 DIAGNOSIS — E870 Hyperosmolality and hypernatremia: Secondary | ICD-10-CM | POA: Diagnosis not present

## 2021-02-07 DIAGNOSIS — F3130 Bipolar disorder, current episode depressed, mild or moderate severity, unspecified: Secondary | ICD-10-CM | POA: Diagnosis not present

## 2021-02-07 DIAGNOSIS — N179 Acute kidney failure, unspecified: Secondary | ICD-10-CM | POA: Diagnosis not present

## 2021-02-07 LAB — BASIC METABOLIC PANEL
Anion gap: 9 (ref 5–15)
BUN: 18 mg/dL (ref 6–20)
CO2: 28 mmol/L (ref 22–32)
Calcium: 10.4 mg/dL — ABNORMAL HIGH (ref 8.9–10.3)
Chloride: 114 mmol/L — ABNORMAL HIGH (ref 98–111)
Creatinine, Ser: 1.16 mg/dL — ABNORMAL HIGH (ref 0.44–1.00)
GFR, Estimated: 55 mL/min — ABNORMAL LOW (ref >60)
Glucose, Bld: 121 mg/dL — ABNORMAL HIGH (ref 70–99)
Potassium: 5 mmol/L (ref 3.5–5.1)
Sodium: 151 mmol/L — ABNORMAL HIGH (ref 135–145)

## 2021-02-07 LAB — CBC
HCT: 42.8 % (ref 36.0–46.0)
Hemoglobin: 13.3 g/dL (ref 12.0–15.0)
MCH: 28.5 pg (ref 26.0–34.0)
MCHC: 31.1 g/dL (ref 30.0–36.0)
MCV: 91.8 fL (ref 80.0–100.0)
Platelets: 75 10*3/uL — ABNORMAL LOW (ref 150–400)
RBC: 4.66 MIL/uL (ref 3.87–5.11)
RDW: 12.9 % (ref 11.5–15.5)
WBC: 5.6 10*3/uL (ref 4.0–10.5)
nRBC: 0 % (ref 0.0–0.2)

## 2021-02-07 MED ORDER — SENNOSIDES-DOCUSATE SODIUM 8.6-50 MG PO TABS
2.0000 | ORAL_TABLET | Freq: Two times a day (BID) | ORAL | Status: DC
  Administered 2021-02-07 – 2021-02-19 (×7): 2 via ORAL
  Filled 2021-02-07 (×18): qty 2

## 2021-02-07 MED ORDER — CARBAMAZEPINE 100 MG PO CHEW
150.0000 mg | CHEWABLE_TABLET | Freq: Three times a day (TID) | ORAL | Status: DC
  Filled 2021-02-07: qty 1.5

## 2021-02-07 MED ORDER — TEMAZEPAM 15 MG PO CAPS
30.0000 mg | ORAL_CAPSULE | Freq: Every day | ORAL | Status: DC
Start: 2021-02-07 — End: 2021-02-20
  Administered 2021-02-08 – 2021-02-19 (×9): 30 mg via ORAL
  Filled 2021-02-07 (×12): qty 2

## 2021-02-07 MED ORDER — RISPERIDONE 1 MG PO TABS
1.0000 mg | ORAL_TABLET | Freq: Two times a day (BID) | ORAL | Status: DC
  Administered 2021-02-07 – 2021-02-09 (×3): 1 mg via ORAL
  Filled 2021-02-07 (×6): qty 1

## 2021-02-07 MED ORDER — POLYETHYLENE GLYCOL 3350 17 G PO PACK
17.0000 g | PACK | Freq: Every day | ORAL | Status: DC
  Administered 2021-02-07: 17 g via ORAL
  Filled 2021-02-07 (×7): qty 1

## 2021-02-07 MED ORDER — HYDROXYZINE HCL 25 MG PO TABS
25.0000 mg | ORAL_TABLET | Freq: Three times a day (TID) | ORAL | Status: DC | PRN
Start: 2021-02-07 — End: 2021-02-19

## 2021-02-07 MED ORDER — APIXABAN 5 MG PO TABS
5.0000 mg | ORAL_TABLET | Freq: Two times a day (BID) | ORAL | Status: DC
  Administered 2021-02-08 – 2021-02-20 (×17): 5 mg via ORAL
  Filled 2021-02-07 (×12): qty 1
  Filled 2021-02-07: qty 2
  Filled 2021-02-07 (×8): qty 1

## 2021-02-07 NOTE — Progress Notes (Signed)
   02/07/21 2300  Provider Notification  Provider Name/Title T.Opyd/MD  Date Provider Notified 02/07/21  Time Provider Notified 2300  Notification Type Page  Notification Reason Other (Comment) (Patient refused Medication: Eliquis;Propraolol;Risperidone;Temazepam;Methimazole;Senexon-S.)  Provider response No new orders

## 2021-02-07 NOTE — Progress Notes (Signed)
PROGRESS NOTE    Gracilyn Gunia Kuras  HGD:[id number] DOB: May 07, 1966 DOA: 02/03/2021 PCP: Sandi Mariscal, MD    Chief Complaint  Patient presents with  . Failure to thrive    Brief Narrative:   55 y.o. female with history of bipolar disorder and schizoaffective disorder who was discharged from [facility] psychiatric facility in [place] on January 21, 2021 as per the patient's sister was brought to the ER after patient since discharge from [facility] has not been eating or drinking or taking any of her medication and also has been having some hallucinations.  On arrival to ED she was found to be in atrial fibrillation with RVR started on Cardizem infusion.  She was also started on IV heparin but she refused blood draws to check heparin level since transition to Lovenox injection.  But her platelets have dropped from 205000 to 75,000's today.  Hold Lovenox at this time. Patient is agreeable to oral medications at this time.  Patient seen and examined at bedside requesting to be discharged home. PT/OT evaluations ordered.        Assessment & Plan:   Principal Problem:   AKI (acute kidney injury) ([place]) Active Problems:   Schizophrenia ([place])   Bipolar disorder, most recent episode depressed ([place])   Hypernatremia   Paroxysmal atrial fibrillation ([place])    AKI with metabolic acidosis and hypernatremia Probably secondary to poor oral intake and free water deficit Improving with IV fluids.  Baseline creatinine less than 1, admitted with a creatinine of 3.3 and currently creatinine is 1.1,  Improved after IV fluids,.  Sodium has been at 167 on admission improved to 151 today.recommend to continue with IV fluids.  Bicarb levels have improved Poor oral intake, will get dietary consult.   Hypokalemia Replaced potassium.    Atrial fibrillation with RVR Probably secondary to hyperthyroidism Was started on Cardizem gtt. transition to propranolol by cardiology. She is currently in sinus  rhythm.  Echocardiogram showed Left ventricular ejection fraction, by estimation, is 70 to 75%. The  left ventricle has hyperdynamic function. The left ventricle has no  regional wall motion abnormalities. Left ventricular diastolic function  could not be evaluated.  She was started on IV heparin for anticoagulation, refusing blood draws to check heparin levels hence was transitioned to Lovenox injections, but in view of her sudden drop in platelets, we will d.c lovenox and plan for eliquis.  CHA2DS2-VASc score is 2. Cardiology on board.     History of bipolar disorder and schizophrenia Patient was on risperidone clozapine,  Restoril, trazodone, Geodon at home.  Since discharge from the psychiatric facility,  she has not been taking any of her meds. She was started on IV Haldol for agitation. Psychiatry recommended starting her on risperidone, restoril and tegretol, but in view of her contraindication to NOAC, we will d/c tegretol and see if we can change to different mood stabilizer.  Psychiatric consulted, suggest her presentation is most likely secondary to hyperthyroidism.  On further talking to her sister she reports that people from [facility] psychiatric facility said that she was not getting better and hence they discharged her home.   Thrombocytopenia Platelets dropped from 205000  To 75,000. Will d/c lovnoex, ordered HIT, transition to eliquis.    Hyperthyroidism She has multinodular goiter on ultrasound of the thyroid from December 2021,. Lab work reveal depressed TSH and elevated free T4, free T3 levels are pending at this time.  Thyroid-stimulating antibodies are are negative from December 2021. Thyrotropin receptor  antibodies are ordered , but she refused lab draws  She will be started on methimazole 10 mg BID. Took her meds yesterday evening and this morning.   She will need endocrinology appointment/follow-up on discharge.    DVT prophylaxis: (Lovenox) Code Status: (Full  code) Family Communication: discussed with sister over the phone.  Disposition:   Status is: Inpatient  Remains inpatient appropriate because:Ongoing diagnostic testing needed not appropriate for outpatient work up and IV treatments appropriate due to intensity of illness or inability to take PO   Dispo: The patient is from: Home              Anticipated d/c is to: SNF              Patient currently is not medically stable to d/c.   Difficult to place patient No       Consultants:   Psychiatry  CARDIOLOGY.   Procedures: none  Antimicrobials: NONE    Subjective: Wants to know when she canbe discharged. Rn reports she is not eating .  Will get dietary consult   Objective: Vitals:   02/06/21 1959 02/06/21 2000 02/07/21 0001 02/07/21 0400  BP:  132/71 126/71 132/61  Pulse:  77 77 70  Resp: (!) 23 20 (!) 21 15  Temp:  99.2 F (37.3 C) 99.2 F (37.3 C) 98.9 F (37.2 C)  TempSrc:  Oral Oral Oral  SpO2:  93% 99% 98%  Weight:    60.5 kg  Height:        Intake/Output Summary (Last 24 hours) at 02/07/2021 1236 Last data filed at 02/07/2021 5732 Gross per 24 hour  Intake 2507.42 ml  Output --  Net 2507.42 ml   Filed Weights   02/03/21 2044 02/03/21 2322 02/07/21 0400  Weight: 55 kg 55.6 kg 60.5 kg    Examination:  General: Patient alert, not in any kind of distress CVS S1-S2 heard, regular rate rhythm, no JVD no pedal edema Lungs air entry fair bilateral, no wheezing heard Abdomen abdomen is soft, nontender bowel sounds normal Extremities no pedal edema Skin no rashes   Data Reviewed: I have personally reviewed following labs and imaging studies  CBC: Recent Labs  Lab 02/03/21 1757 02/03/21 2217 02/05/21 1004 02/06/21 0452 02/07/21 0348  WBC 11.0* 12.9* 7.6 4.9 5.6  NEUTROABS 8.1*  --   --   --   --   HGB 16.9* 15.6* 14.5 12.8 13.3  HCT 54.9* 50.7* 47.0* 40.6 42.8  MCV 91.8 92.2 90.2 89.6 91.8  PLT 207 178 125* 112* 75*    Basic Metabolic  Panel: Recent Labs  Lab 02/03/21 1757 02/03/21 2217 02/04/21 0426 02/04/21 0956 02/04/21 1003 02/06/21 1022 02/07/21 0348  NA 167*  --  155*  --  150* 153* 151*  K 4.6  --  3.7  --  3.3* 3.5 5.0  CL 128*  --  119*  --  116* 116* 114*  CO2 19*  --  24  --  28 28 28   GLUCOSE 144*  --  221*  --  184* 148* 121*  BUN 97*  --  83*  --  75* 28* 18  CREATININE 3.38* 2.50* 2.30*  --  2.03* 1.13* 1.16*  CALCIUM 10.6*  --  9.9  --  9.9 9.8 10.4*  MG  --   --   --  2.5*  --   --   --   PHOS  --   --   --  2.3*  --   --   --  GFR: Estimated Creatinine Clearance: 44.7 mL/min (A) (by C-G formula based on SCr of 1.16 mg/dL (H)).  Liver Function Tests: Recent Labs  Lab 02/03/21 1757  AST 11*  ALT 12  ALKPHOS 83  BILITOT 1.1  PROT 7.4  ALBUMIN 4.2    CBG: No results for input(s): GLUCAP in the last 168 hours.   Recent Results (from the past 240 hour(s))  Urine culture     Status: None   Collection Time: 02/03/21  5:48 PM   Specimen: In/Out Cath Urine  Result Value Ref Range Status   Specimen Description   Final    IN/OUT CATH URINE Performed at [facility] [address], [place], [place] [address]    Special Requests   Final    NONE Performed at [facility], [place] [address], [place], [place] [address]    Culture   Final    NO GROWTH Performed at [facility], [place] [address] [place], [place] [address]    Report Status 02/06/2021 FINAL  Final  SARS CORONAVIRUS 2 (TAT 6-24 HRS) Nasopharyngeal Nasopharyngeal Swab     Status: None   Collection Time: 02/03/21  7:00 PM   Specimen: Nasopharyngeal Swab  Result Value Ref Range Status   SARS Coronavirus 2 NEGATIVE NEGATIVE Final    Comment: (NOTE) SARS-CoV-2 target nucleic acids are NOT DETECTED.  The SARS-CoV-2 RNA is generally detectable in upper and lower respiratory specimens during the acute phase of infection. Negative results do not preclude SARS-CoV-2 infection, do not  rule out co-infections with other pathogens, and should not be used as the sole basis for treatment or other patient management decisions. Negative results must be combined with clinical observations, patient history, and epidemiological information. The expected result is Negative.  Fact Sheet for Patients: SugarRoll.be  Fact Sheet for Healthcare Providers: https://www.woods-mathews.com/  This test is not yet approved or cleared by the [place] FDA and  has been authorized for detection and/or diagnosis of SARS-CoV-2 by FDA under an Emergency Use Authorization (EUA). This EUA will remain  in effect (meaning this test can be used) for the duration of the COVID-19 declaration under Se ction 564(b)(1) of the Act, 21 U.S.C. section 360bbb-3(b)(1), unless the authorization is terminated or revoked sooner.  Performed at [facility], [place] [address] [place],  [address]          Radiology Studies: ECHOCARDIOGRAM COMPLETE  Result Date: 02/06/2021    ECHOCARDIOGRAM REPORT   Patient Name:   VALMA ROTENBERG Date of Exam: 02/06/2021 Medical Rec #:  [id number]             Height:       61.0 in Accession #:    [id number]            Weight:       122.6 lb Date of Birth:  05/29/1966             BSA:          1.534 m Patient Age:    14 years              BP:           121/74 mmHg Patient Gender: F                     HR:           73 bpm. Exam Location:  Inpatient Procedure: 2D Echo, Cardiac Doppler and  Color Doppler Indications:    I48.91* Unspeicified atrial fibrillation  History:        Patient has no prior history of Echocardiogram examinations.                 Signs/Symptoms:Altered Mental Status; Risk Factors:Hypertension                 and Dyslipidemia. Schizoaffective disorder.  Sonographer:    Roseanna Rainbow RDCS Referring Phys: [place]  Sonographer Comments: Technically difficult study due to poor echo windows.  Image acquisition challenging due to uncooperative patient. On initial encounter for exam, patient refused to continue at [place]. I ended exam and left the room. Nursing staff insisted that I try again to finish exam with sitter present in room. I then set up machine again, and continued test. Patient had trouble holding breath and following directions. Patient moved and had to be coached countless times. Patient pushed probe away 3 times. IMPRESSIONS  1. Left ventricular ejection fraction, by estimation, is 70 to 75%. The left ventricle has hyperdynamic function. The left ventricle has no regional wall motion abnormalities. Left ventricular diastolic function could not be evaluated.  2. Right ventricular systolic function is normal. The right ventricular size is normal. Tricuspid regurgitation signal is inadequate for assessing PA pressure.  3. The mitral valve is grossly normal. No evidence of mitral valve regurgitation. No evidence of mitral stenosis.  4. The aortic valve is tricuspid. Aortic valve regurgitation is not visualized. No aortic stenosis is present.  5. The inferior vena cava is normal in size with greater than 50% respiratory variability, suggesting right atrial pressure of 3 mmHg. FINDINGS  Left Ventricle: Left ventricular ejection fraction, by estimation, is 70 to 75%. The left ventricle has hyperdynamic function. The left ventricle has no regional wall motion abnormalities. The left ventricular internal cavity size was normal in size. There is no left ventricular hypertrophy. Left ventricular diastolic function could not be evaluated due to atrial fibrillation. Left ventricular diastolic function could not be evaluated. Right Ventricle: The right ventricular size is normal. No increase in right ventricular wall thickness. Right ventricular systolic function is normal. Tricuspid regurgitation signal is inadequate for assessing PA pressure. Left Atrium: Left atrial size was normal in size. Right  Atrium: Right atrial size was normal in size. Pericardium: Trivial pericardial effusion is present. Mitral Valve: The mitral valve is grossly normal. No evidence of mitral valve regurgitation. No evidence of mitral valve stenosis. Tricuspid Valve: The tricuspid valve is grossly normal. Tricuspid valve regurgitation is trivial. No evidence of tricuspid stenosis. Aortic Valve: The aortic valve is tricuspid. Aortic valve regurgitation is not visualized. No aortic stenosis is present. Pulmonic Valve: The pulmonic valve was grossly normal. Pulmonic valve regurgitation is not visualized. No evidence of pulmonic stenosis. Aorta: The aortic root and ascending aorta are structurally normal, with no evidence of dilitation. Venous: The inferior vena cava is normal in size with greater than 50% respiratory variability, suggesting right atrial pressure of 3 mmHg. IAS/Shunts: The atrial septum is grossly normal.  LEFT VENTRICLE PLAX 2D LVIDd:         2.90 cm     Diastology LVIDs:         1.50 cm     LV e' medial:    9.57 cm/s LV PW:         1.17 cm     LV E/e' medial:  5.4 LV IVS:        1.20 cm  LV e' lateral:   11.00 cm/s LVOT diam:     1.70 cm     LV E/e' lateral: 4.7 LV SV:         47 LV SV Index:   30 LVOT Area:     2.27 cm  LV Volumes (MOD) LV vol d, MOD A2C: 26.8 ml LV vol d, MOD A4C: 57.9 ml LV vol s, MOD A2C: 4.9 ml LV vol s, MOD A4C: 22.4 ml LV SV MOD A2C:     22.0 ml LV SV MOD A4C:     57.9 ml LV SV MOD BP:      29.8 ml RIGHT VENTRICLE             IVC RV S prime:     16.80 cm/s  IVC diam: 0.80 cm TAPSE (M-mode): 1.6 cm LEFT ATRIUM             Index       RIGHT ATRIUM           Index LA diam:        3.30 cm 2.15 cm/m  RA Area:     10.30 cm LA Vol (A2C):   5.4 ml  3.55 ml/m  RA Volume:   19.00 ml  12.39 ml/m LA Vol (A4C):   17.1 ml 11.15 ml/m LA Biplane Vol: 9.5 ml  6.21 ml/m  AORTIC VALVE LVOT Vmax:   155.00 cm/s LVOT Vmean:  83.200 cm/s LVOT VTI:    0.206 m  AORTA Ao Root diam: 2.90 cm Ao Asc diam:  2.80 cm  MITRAL VALVE MV Area (PHT): 2.48 cm    SHUNTS MV Decel Time: 306 msec    Systemic VTI:  0.21 m MV E velocity: 52.10 cm/s  Systemic Diam: 1.70 cm MV A velocity: 84.60 cm/s MV E/A ratio:  0.62 Eleonore Chiquito MD Electronically signed by Eleonore Chiquito MD Signature Date/Time: 02/06/2021/12:18:28 PM    Final         Scheduled Meds: . carbamazepine  150 mg Oral TID  . feeding supplement  237 mL Oral BID BM  . methimazole  10 mg Oral BID  . multivitamin with minerals  1 tablet Oral Daily  . polyethylene glycol  17 g Oral Daily  . propranolol  40 mg Oral BID  . risperidone  1 mg Oral BID  . senna-docusate  2 tablet Oral BID  . temazepam  30 mg Oral QHS   Continuous Infusions: . dextrose 75 mL/hr at 02/06/21 2311     LOS: 4 days        Hosie Poisson, MD [facility]   To contact the attending provider between 7A-7P or the covering provider during after hours 7P-7A, please log into the web site [email or web address] and access using [place] password for that web site. If you do not have the password, please call the hospital operator.  02/07/2021, 12:36 PM

## 2021-02-07 NOTE — Progress Notes (Signed)
Physical Therapy Treatment Patient Details Name: Paula Dickson MRN: [id number] DOB: 08/09/1966 Today's Date: 02/07/2021    History of Present Illness Pt admitted from home with FTT - not eating and staying in bed.  Pt with hx of bipolar and schizophrenia and dc from [facility] in [place] in February    PT Comments    Pt reluctant to participate however with encouragement from PT and sitter, pt ambulated in hallway and used bathroom prior to returning to bed.  Pt's d/c plan updated as pt able to mobilize just requires encouragement and safety cues.   Follow Up Recommendations  Supervision for mobility/OOB;No PT follow up     Equipment Recommendations  None recommended by PT    Recommendations for Other Services       Precautions / Restrictions Precautions Precautions: Fall    Mobility  Bed Mobility Overal bed mobility: Modified Independent                  Transfers Overall transfer level: Needs assistance Equipment used: None Transfers: Sit to/from Stand Sit to Stand: Min guard         General transfer comment: min/guard for safety; +2 for safety and lines, pt impulsive and requiring frequent safety cues esp for lines  Ambulation/Gait Ambulation/Gait assistance: Min guard Gait Distance (Feet): 240 Feet Assistive device: None Gait Pattern/deviations: Step-through pattern;Decreased stride length;Narrow base of support     General Gait Details: quick pace however able to to slow down briefly when requested; no LOB observed, min/guard for safety/lines   Stairs             Wheelchair Mobility    Modified Rankin (Stroke Patients Only)       Balance           Standing balance support: No upper extremity supported Standing balance-Leahy Scale: Good                              Cognition Arousal/Alertness: Awake/alert Behavior During Therapy: Flat affect Overall Cognitive Status: Within Functional Limits for tasks  assessed                                 General Comments: impulsive      Exercises      General Comments General comments (skin integrity, edema, etc.): Pt denied needing to use bathroom however sitter present encouraged (states pt will just go in bed); pt then urinated significant amount in toilet; able to perform pericare and wash hands with cues to complete      Pertinent Vitals/Pain Pain Assessment: No/denies pain    Home Living                      Prior Function            PT Goals (current goals can now be found in the care plan section) Progress towards PT goals: Progressing toward goals    Frequency    Min 2X/week      PT Plan Current plan remains appropriate;Frequency needs to be updated    Co-evaluation              AM-PAC PT "6 Clicks" Mobility   Outcome Measure  Help needed turning from your back to your side while in a flat bed without using bedrails?: None Help needed moving from lying on your  back to sitting on the side of a flat bed without using bedrails?: A Little Help needed moving to and from a bed to a chair (including a wheelchair)?: A Little Help needed standing up from a chair using your arms (e.g., wheelchair or bedside chair)?: A Little Help needed to walk in hospital room?: A Little Help needed climbing 3-5 steps with a railing? : A Little 6 Click Score: 19    End of Session Equipment Utilized During Treatment: Other (comment) (pt declined/ refused gait belt) Activity Tolerance: Patient tolerated treatment well Patient left: in bed;with call bell/phone within reach;with nursing/sitter in room Nurse Communication: Mobility status PT Visit Diagnosis: Difficulty in walking, not elsewhere classified (R26.2);Muscle weakness (generalized) (M62.81)     Time: 3491-7915 PT Time Calculation (min) (ACUTE ONLY): 13 min  Charges:  $Gait Training: 8-22 mins                     Paula Dickson PT, DPT Acute Rehabilitation  Services Pager: [phone] Office: [phone]  Paula Dickson E 02/07/2021, 1:01 PM

## 2021-02-07 NOTE — Discharge Instructions (Signed)

## 2021-02-07 NOTE — Plan of Care (Signed)

## 2021-02-07 NOTE — Consult Note (Signed)
Paula Dickson is a 56 y.o. female with history of bipolar disorder and schizoaffective disorder who was discharged from [facility] psychiatric facility in [place] on January 21, 2021 as per the patient's sister was brought to the ER after patient since discharge from [facility] has not been eating or drinking or taking any of her medication and also has been having some hallucinations.  No suicidal thoughts or attempts.  Per patient's sister patient has been off lithium for more than a year.  Since patient has not been eating or drinking or taking medications patient was involuntarily committed and brought to the ER.  Psychiatry consult placed for medication assistance for paranoid schizophrenia.  Patient is seen and assessed by this nurse practitioner.  Patient is alert and oriented, calm and cooperative, and engages well with provider.  Patient is alert and oriented x2, as she is able to identify herself and location [facility].  Patient appears to be easily distracted, difficulty concentrating in, as her main focus is going home. "  I miss my dad, and I just want to go home.  Please let me go home.  Please let me go home. "  Patient continues to perseverate throughout the evaluation and expressing concerns wanting to return home, despite multiple attempts advised the patient she is not yet medically stable to discharge home.  When assessing most recent hospitalization at [facility], she advised that she does not wish to take her medication.  "  I do not like that Risperdal.  Please do not make me take that Risperdal.  I want to go home. "  She is unable to elaborate her reasons as to why she does not wish to resume her medication.  Per chart review patient was recently discharged from [facility] less than 3 weeks ago on risperidone, olanzapine, carbamazepine, Restoril, trazodone, and Geodon.  Chart review also indicates the patient was discharged from [facility], as she was not showing any  improvement or appear to be responding appropriately to psychotropic medication.   Patient continues to be confused, although does not exhibit any acute psychosis.  She denies any auditory or visual hallucinations.  She does not appear to be responding to internal stimuli, external stimuli, and or preoccupied.  She also denies any suicidal ideation, homicidal ideation.  She continues to refuse to eat, drink, and or take any of her medications.  Per safety sitter patient has been able to drink Ensure throughout the day, as some type of nutritional intake.  As previously indicated patient continues to require treatment for hyper thyroidism (TSH <0.010, Free T4 2.28) , in which she has been started on methimazole 10 mg p.o. twice daily.   Review of MAR shows patient has received this medication as ordered.  Will resume Risperdal 1 mg p.o. twice daily, carbamazepine 150 mg p.o. 3 times daily, hydroxyzine 25 mg p.o. 3 times daily as needed, and temazepam 30 mg p.o. nightly.  Patient does have a history of schizophrenia and bipolar, although she does not appear to be exhibiting any current psychosis, mania, paranoia, delusional thought disorder, and or hallucinations. Patient does not meet inpatient criteria at this time.  -Will resume medications as previously indicated.  Patient with diagnosis of schizophrenia and bipolar (no acute psychiatrica presentation at this time), with multiple psychiatric inpatient hospitalizations secondary to medication noncompliance.  Patient would likely benefit from act team services. -Recommend referral to act team. -Patient does not meet inpatient criteria at this time.  As noted was recently discharged from [facility] about 3 weeks ago with no improvement and or benefit from inpatient hospitalization. Will recommend endocrinology be seen inpatient, as patient has exhibited difficulty following up outpatient.  -Patient can continue to receive outpatient behavioral health resources  at [facility]. -Psychiatry to sign off at this time.

## 2021-02-07 NOTE — Care Management Important Message (Signed)
Important Message  Patient Details IM Letter given to the Patient. Name: Paula Dickson MRN: [id number] Date of Birth: 1966-11-12   Medicare Important Message Given:  Yes     Kerin Salen 02/07/2021, 1:20 PM

## 2021-02-08 DIAGNOSIS — E870 Hyperosmolality and hypernatremia: Secondary | ICD-10-CM | POA: Diagnosis not present

## 2021-02-08 DIAGNOSIS — F3130 Bipolar disorder, current episode depressed, mild or moderate severity, unspecified: Secondary | ICD-10-CM | POA: Diagnosis not present

## 2021-02-08 DIAGNOSIS — N179 Acute kidney failure, unspecified: Secondary | ICD-10-CM | POA: Diagnosis not present

## 2021-02-08 DIAGNOSIS — E0590 Thyrotoxicosis, unspecified without thyrotoxic crisis or storm: Secondary | ICD-10-CM | POA: Diagnosis not present

## 2021-02-08 LAB — CBC WITH DIFFERENTIAL/PLATELET
Abs Immature Granulocytes: 0.01 10*3/uL (ref 0.00–0.07)
Basophils Absolute: 0 10*3/uL (ref 0.0–0.1)
Basophils Relative: 0 %
Eosinophils Absolute: 0.2 10*3/uL (ref 0.0–0.5)
Eosinophils Relative: 3 %
HCT: 40.2 % (ref 36.0–46.0)
Hemoglobin: 13.1 g/dL (ref 12.0–15.0)
Immature Granulocytes: 0 %
Lymphocytes Relative: 39 %
Lymphs Abs: 2 10*3/uL (ref 0.7–4.0)
MCH: 28.5 pg (ref 26.0–34.0)
MCHC: 32.6 g/dL (ref 30.0–36.0)
MCV: 87.4 fL (ref 80.0–100.0)
Monocytes Absolute: 0.5 10*3/uL (ref 0.1–1.0)
Monocytes Relative: 11 %
Neutro Abs: 2.3 10*3/uL (ref 1.7–7.7)
Neutrophils Relative %: 47 %
Platelets: 115 10*3/uL — ABNORMAL LOW (ref 150–400)
RBC: 4.6 MIL/uL (ref 3.87–5.11)
RDW: 12.8 % (ref 11.5–15.5)
WBC: 5 10*3/uL (ref 4.0–10.5)
nRBC: 0 % (ref 0.0–0.2)

## 2021-02-08 LAB — BASIC METABOLIC PANEL
Anion gap: 10 (ref 5–15)
BUN: 13 mg/dL (ref 6–20)
CO2: 30 mmol/L (ref 22–32)
Calcium: 10 mg/dL (ref 8.9–10.3)
Chloride: 107 mmol/L (ref 98–111)
Creatinine, Ser: 1.03 mg/dL — ABNORMAL HIGH (ref 0.44–1.00)
GFR, Estimated: >60 mL/min (ref >60)
Glucose, Bld: 119 mg/dL — ABNORMAL HIGH (ref 70–99)
Potassium: 3.8 mmol/L (ref 3.5–5.1)
Sodium: 147 mmol/L — ABNORMAL HIGH (ref 135–145)

## 2021-02-08 LAB — PHOSPHORUS: Phosphorus: 3 mg/dL (ref 2.5–4.6)

## 2021-02-08 LAB — MAGNESIUM: Magnesium: 1.8 mg/dL (ref 1.7–2.4)

## 2021-02-08 LAB — HEPARIN INDUCED PLATELET AB (HIT ANTIBODY): Heparin Induced Plt Ab: 0.078 OD (ref 0.000–0.400)

## 2021-02-08 MED ORDER — ENSURE ENLIVE PO LIQD
237.0000 mL | Freq: Three times a day (TID) | ORAL | Status: DC
  Administered 2021-02-08 – 2021-02-19 (×12): 237 mL via ORAL

## 2021-02-08 NOTE — Progress Notes (Signed)
PROGRESS NOTE    Paula Dickson  GYI:[id number] DOB: 1965/12/12 DOA: 02/03/2021 PCP: Sandi Mariscal, MD    Chief Complaint  Patient presents with  . Failure to thrive    Brief Narrative:   55 y.o. female with history of bipolar disorder and schizoaffective disorder, schizophrenia,  was discharged from [facility] psychiatric facility in [place] on January 21, 2021, and as per the patient's sister was brought to the ER for hallucinations and for not eating and drinking. On arrival to ED she was found to be in atrial fibrillation with RVR, started on Cardizem infusion transitioned to oral propranolol and oral eliquis by cardiology. She was also found to be in hyperthyroidism, was started on methimazole. US thyroid from 3 months ago showed multi nodular goiter. Unfortunately she has never followed up with a PCP or an endocrinologist. thyrotropin receptor antibodies ordered and pending. Her main issue at this point is non compliance to medications and poor oral intake. She has a Actuary at bedside.  She refuses blood draws and intermittent refuse to be examined. Psychiatry consulted , feels she is not a candidate for inpatient admission. Please see their note. Unfortunately she is not a safe discharge home as she refuses to eat, take medications. She lives by herself.      Assessment & Plan:   Principal Problem:   AKI (acute kidney injury) ([place]) Active Problems:   Schizophrenia ([place])   Bipolar disorder, most recent episode depressed ([place])   Hypernatremia   Paroxysmal atrial fibrillation ([place])    AKI with metabolic acidosis and hypernatremia Probably secondary to poor oral intake and free water deficit Improving with IV fluids.  Baseline creatinine less than 1, admitted with a creatinine of 3.3 and currently creatinine is 1.1,  Improved after IV fluids,.  Sodium has been at 167 on admission improved to 151 today.  Bicarb levels have improved Recommend to continue with IV fluids till  electrolyte abnormalities improve.  Poor oral intake, will get dietary consult.   Hypokalemia Replaced.   Atrial fibrillation with RVR Probably secondary to hyperthyroidism Was started on Cardizem gtt. transition to propranolol by cardiology. She is currently in sinus rhythm.  Echocardiogram showed Left ventricular ejection fraction, by estimation, is 70 to 75%. The  left ventricle has hyperdynamic function. The left ventricle has no  regional wall motion abnormalities. Left ventricular diastolic function  could not be evaluated.  She was started on IV heparin for anticoagulation, refusing blood draws to check heparin levels hence was transitioned to Lovenox injections, but in view of her sudden drop in platelets, we have discontinued lovenox and started her on eliquis.  CHA2DS2-VASc score is 2.     History of bipolar disorder and  Paranoid schizophrenia Patient was on risperidone clozapine,  Restoril, trazodone, Geodon at home.  Since discharge from the psychiatric facility,  she has not been taking any of her meds. She was started on IV Haldol for agitation. Psychiatry recommended starting her on risperidone, restoril and tegretol, but in view of her contraindication to NOAC, we will d/c tegretol and see if we can change to different mood stabilizer.  Psychiatric consulted, suggest her presentation is most likely secondary to hyperthyroidism.  On further talking to her sister she reports that people from [facility] psychiatric facility said that she was not getting better and hence they discharged her home. Requested for ACT team    Thrombocytopenia Unclear etiology.  Platelets dropped from 205000  To 75,000. Will d/c lovnoex, ordered HIT, transition  to eliquis.  Monitor platelet count today.    Hyperthyroidism She has multinodular goiter on ultrasound of the thyroid from December 2021,. Lab work reveal depressed TSH and elevated free T4, free T3 levels are pending at this time.   Thyroid-stimulating antibodies are are negative from December 2021. Thyrotropin receptor antibodies are ordered , but she refused lab draws  She will be started on methimazole 10 mg BID. She will need endocrinology appointment/follow-up on discharge.   Leukocytosis:  Probably reactive, no signs of infection.   Hypophosphatemia:  Replaced.  Repeat levels today.     DVT prophylaxis: eliquis.  Code Status: (Full code) Family Communication: discussed with sister over the phone.  Disposition:   Status is: Inpatient  Remains inpatient appropriate because:Ongoing diagnostic testing needed not appropriate for outpatient work up and IV treatments appropriate due to intensity of illness or inability to take PO   Dispo: The patient is from: Home              Anticipated d/c is to: pending.               Patient currently is not medically stable to d/c.   Difficult to place patient No       Consultants:   Psychiatry  CARDIOLOGY.   Procedures: none  Antimicrobials: NONE    Subjective: Appears calm, wants to know when she can be discharged home.   Objective: Vitals:   02/07/21 2004 02/08/21 0000 02/08/21 0717 02/08/21 0812  BP: (!) 151/85   (!) 158/80  Pulse: 73 74 74   Resp: 14  18   Temp: 98.8 F (37.1 C) 98.8 F (37.1 C)    TempSrc: Oral Oral    SpO2: 100% 100% 100%   Weight:      Height:        Intake/Output Summary (Last 24 hours) at 02/08/2021 0827 Last data filed at 02/07/2021 1500 Gross per 24 hour  Intake 720 ml  Output -  Net 720 ml   Filed Weights   02/03/21 2044 02/03/21 2322 02/07/21 0400  Weight: 55 kg 55.6 kg 60.5 kg    Examination:  General: alert, and comfortable, on RA.  CVS : s1s2 heard, RRR, no JVD, no pedal edema.  Lungs : Air entry fair , no wheezing heard.  Abdomen: abdomen is soft, non tender non distended, bowel sounds wnl.  Extremities : No pedal edema Skin: no rashes seen.    Data Reviewed: I have personally reviewed  following labs and imaging studies  CBC: Recent Labs  Lab 02/03/21 1757 02/03/21 2217 02/05/21 1004 02/06/21 0452 02/07/21 0348  WBC 11.0* 12.9* 7.6 4.9 5.6  NEUTROABS 8.1*  --   --   --   --   HGB 16.9* 15.6* 14.5 12.8 13.3  HCT 54.9* 50.7* 47.0* 40.6 42.8  MCV 91.8 92.2 90.2 89.6 91.8  PLT 207 178 125* 112* 75*    Basic Metabolic Panel: Recent Labs  Lab 02/03/21 1757 02/03/21 2217 02/04/21 0426 02/04/21 0956 02/04/21 1003 02/06/21 1022 02/07/21 0348  NA 167*  --  155*  --  150* 153* 151*  K 4.6  --  3.7  --  3.3* 3.5 5.0  CL 128*  --  119*  --  116* 116* 114*  CO2 19*  --  24  --  28 28 28   GLUCOSE 144*  --  221*  --  184* 148* 121*  BUN 97*  --  83*  --  75* 28* 18  CREATININE 3.38* 2.50* 2.30*  --  2.03* 1.13* 1.16*  CALCIUM 10.6*  --  9.9  --  9.9 9.8 10.4*  MG  --   --   --  2.5*  --   --   --   PHOS  --   --   --  2.3*  --   --   --     GFR: Estimated Creatinine Clearance: 44.7 mL/min (A) (by C-G formula based on SCr of 1.16 mg/dL (H)).  Liver Function Tests: Recent Labs  Lab 02/03/21 1757  AST 11*  ALT 12  ALKPHOS 83  BILITOT 1.1  PROT 7.4  ALBUMIN 4.2    CBG: No results for input(s): GLUCAP in the last 168 hours.   Recent Results (from the past 240 hour(s))  Urine culture     Status: None   Collection Time: 02/03/21  5:48 PM   Specimen: In/Out Cath Urine  Result Value Ref Range Status   Specimen Description   Final    IN/OUT CATH URINE Performed at [facility] [address], [place], [place] [address]    Special Requests   Final    NONE Performed at [facility], [place] [address], [place], [place] [address]    Culture   Final    NO GROWTH Performed at [facility], [place] [address] [place], [place] [address]    Report Status 02/06/2021 FINAL  Final  SARS CORONAVIRUS 2 (TAT 6-24 HRS) Nasopharyngeal Nasopharyngeal Swab     Status: None   Collection Time: 02/03/21  7:00 PM   Specimen:  Nasopharyngeal Swab  Result Value Ref Range Status   SARS Coronavirus 2 NEGATIVE NEGATIVE Final    Comment: (NOTE) SARS-CoV-2 target nucleic acids are NOT DETECTED.  The SARS-CoV-2 RNA is generally detectable in upper and lower respiratory specimens during the acute phase of infection. Negative results do not preclude SARS-CoV-2 infection, do not rule out co-infections with other pathogens, and should not be used as the sole basis for treatment or other patient management decisions. Negative results must be combined with clinical observations, patient history, and epidemiological information. The expected result is Negative.  Fact Sheet for Patients: SugarRoll.be  Fact Sheet for Healthcare Providers: https://www.woods-mathews.com/  This test is not yet approved or cleared by the [place] FDA and  has been authorized for detection and/or diagnosis of SARS-CoV-2 by FDA under an Emergency Use Authorization (EUA). This EUA will remain  in effect (meaning this test can be used) for the duration of the COVID-19 declaration under Se ction 564(b)(1) of the Act, 21 U.S.C. section 360bbb-3(b)(1), unless the authorization is terminated or revoked sooner.  Performed at [facility], [place] [address] [place], [place] [address]          Radiology Studies: ECHOCARDIOGRAM COMPLETE  Result Date: 02/06/2021    ECHOCARDIOGRAM REPORT   Patient Name:   Paula Dickson Date of Exam: 02/06/2021 Medical Rec #:  [id number]             Height:       61.0 in Accession #:    [id number]            Weight:       122.6 lb Date of Birth:  Aug 26, 1966             BSA:          1.534 m Patient Age:    55 years  BP:           121/74 mmHg Patient Gender: F                     HR:           73 bpm. Exam Location:  Inpatient Procedure: 2D Echo, Cardiac Doppler and Color Doppler Indications:    I48.91* Unspeicified atrial fibrillation  History:         Patient has no prior history of Echocardiogram examinations.                 Signs/Symptoms:Altered Mental Status; Risk Factors:Hypertension                 and Dyslipidemia. Schizoaffective disorder.  Sonographer:    Roseanna Rainbow RDCS Referring Phys: [place]  Sonographer Comments: Technically difficult study due to poor echo windows. Image acquisition challenging due to uncooperative patient. On initial encounter for exam, patient refused to continue at [place]. I ended exam and left the room. Nursing staff insisted that I try again to finish exam with sitter present in room. I then set up machine again, and continued test. Patient had trouble holding breath and following directions. Patient moved and had to be coached countless times. Patient pushed probe away 3 times. IMPRESSIONS  1. Left ventricular ejection fraction, by estimation, is 70 to 75%. The left ventricle has hyperdynamic function. The left ventricle has no regional wall motion abnormalities. Left ventricular diastolic function could not be evaluated.  2. Right ventricular systolic function is normal. The right ventricular size is normal. Tricuspid regurgitation signal is inadequate for assessing PA pressure.  3. The mitral valve is grossly normal. No evidence of mitral valve regurgitation. No evidence of mitral stenosis.  4. The aortic valve is tricuspid. Aortic valve regurgitation is not visualized. No aortic stenosis is present.  5. The inferior vena cava is normal in size with greater than 50% respiratory variability, suggesting right atrial pressure of 3 mmHg. FINDINGS  Left Ventricle: Left ventricular ejection fraction, by estimation, is 70 to 75%. The left ventricle has hyperdynamic function. The left ventricle has no regional wall motion abnormalities. The left ventricular internal cavity size was normal in size. There is no left ventricular hypertrophy. Left ventricular diastolic function could not be evaluated due to atrial  fibrillation. Left ventricular diastolic function could not be evaluated. Right Ventricle: The right ventricular size is normal. No increase in right ventricular wall thickness. Right ventricular systolic function is normal. Tricuspid regurgitation signal is inadequate for assessing PA pressure. Left Atrium: Left atrial size was normal in size. Right Atrium: Right atrial size was normal in size. Pericardium: Trivial pericardial effusion is present. Mitral Valve: The mitral valve is grossly normal. No evidence of mitral valve regurgitation. No evidence of mitral valve stenosis. Tricuspid Valve: The tricuspid valve is grossly normal. Tricuspid valve regurgitation is trivial. No evidence of tricuspid stenosis. Aortic Valve: The aortic valve is tricuspid. Aortic valve regurgitation is not visualized. No aortic stenosis is present. Pulmonic Valve: The pulmonic valve was grossly normal. Pulmonic valve regurgitation is not visualized. No evidence of pulmonic stenosis. Aorta: The aortic root and ascending aorta are structurally normal, with no evidence of dilitation. Venous: The inferior vena cava is normal in size with greater than 50% respiratory variability, suggesting right atrial pressure of 3 mmHg. IAS/Shunts: The atrial septum is grossly normal.  LEFT VENTRICLE PLAX 2D LVIDd:         2.90 cm  Diastology LVIDs:         1.50 cm     LV e' medial:    9.57 cm/s LV PW:         1.17 cm     LV E/e' medial:  5.4 LV IVS:        1.20 cm     LV e' lateral:   11.00 cm/s LVOT diam:     1.70 cm     LV E/e' lateral: 4.7 LV SV:         47 LV SV Index:   30 LVOT Area:     2.27 cm  LV Volumes (MOD) LV vol d, MOD A2C: 26.8 ml LV vol d, MOD A4C: 57.9 ml LV vol s, MOD A2C: 4.9 ml LV vol s, MOD A4C: 22.4 ml LV SV MOD A2C:     22.0 ml LV SV MOD A4C:     57.9 ml LV SV MOD BP:      29.8 ml RIGHT VENTRICLE             IVC RV S prime:     16.80 cm/s  IVC diam: 0.80 cm TAPSE (M-mode): 1.6 cm LEFT ATRIUM             Index       RIGHT ATRIUM            Index LA diam:        3.30 cm 2.15 cm/m  RA Area:     10.30 cm LA Vol (A2C):   5.4 ml  3.55 ml/m  RA Volume:   19.00 ml  12.39 ml/m LA Vol (A4C):   17.1 ml 11.15 ml/m LA Biplane Vol: 9.5 ml  6.21 ml/m  AORTIC VALVE LVOT Vmax:   155.00 cm/s LVOT Vmean:  83.200 cm/s LVOT VTI:    0.206 m  AORTA Ao Root diam: 2.90 cm Ao Asc diam:  2.80 cm MITRAL VALVE MV Area (PHT): 2.48 cm    SHUNTS MV Decel Time: 306 msec    Systemic VTI:  0.21 m MV E velocity: 52.10 cm/s  Systemic Diam: 1.70 cm MV A velocity: 84.60 cm/s MV E/A ratio:  0.62 Eleonore Chiquito MD Electronically signed by Eleonore Chiquito MD Signature Date/Time: 02/06/2021/12:18:28 PM    Final         Scheduled Meds: . apixaban  5 mg Oral BID  . feeding supplement  237 mL Oral BID BM  . methimazole  10 mg Oral BID  . multivitamin with minerals  1 tablet Oral Daily  . polyethylene glycol  17 g Oral Daily  . propranolol  40 mg Oral BID  . risperidone  1 mg Oral BID  . senna-docusate  2 tablet Oral BID  . temazepam  30 mg Oral QHS   Continuous Infusions: . dextrose 75 mL/hr at 02/08/21 0652     LOS: 5 days        Hosie Poisson, MD [facility]   To contact the attending provider between 7A-7P or the covering provider during after hours 7P-7A, please log into the web site [email or web address] and access using universal [place] password for that web site. If you do not have the password, please call the hospital operator.  02/08/2021, 8:27 AM
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
## 2021-02-08 NOTE — Progress Notes (Signed)
Nutrition Follow-up  DOCUMENTATION CODES:   Not applicable  INTERVENTION:  - will increase Ensure Enlive from BID to TID. - will request at least 1 item on each meal tray be pre-packaged item that patient has to open on her own.  - continue to encourage PO intakes.    NUTRITION DIAGNOSIS:   Inadequate oral intake related to chronic illness as evidenced by per patient/family report,meal completion < 25%. -ongoing  GOAL:   Patient will meet greater than or equal to 90% of their needs -unmet  MONITOR:   PO intake,Supplement acceptance,Labs,Weight trends,I & O's  REASON FOR ASSESSMENT:   Consult Poor PO  ASSESSMENT:   55 y.o. female with history of bipolar disorder and schizoaffective disorder who was discharged from [facility] psychiatric facility in [place] on January 21, 2021 as per the patient's sister was brought to the ER after patient since discharge from [facility] has not been eating or drinking or taking any of her medication and also has been having some hallucinations.  No suicidal thoughts or attempts.  Per patient's sister patient has been off lithium for more than a year.  Since patient has not been eating or drinking or taking medications patient was involuntarily committed and brought to the ER.  Patient has consumed 0% at all meals since dinner on 3/12. She has accepted Ensure Enlive 100% of the time offered. If she is drinking 100% of the bottle BID she is consuming 700 kcal and 40 grams protein.  Patient laying in bed with sitter at bedside. Lunch tray on bedside table, untouched. Patient not receptive to RD visit or discussion. She states that she has already received nutrition and does not need to eat in the hospital. Patient asking when she will be discharged as she is interested in going home.   Weight on admission (3/10) was documented as 148 lb, 121 lb, and 123 lb. Weight yesterday was 133 lb. No information documented in the edema section of flow sheet.    Per notes: - AKI thought to be 2/2 poor PO intakes - afib with RVR - hx of bipolar disorder and paranoid schizophrenia--was not taking her psych meds at home; sister reported that staff at [facility] reported patient was not improving during admission so she was discharged from their facility   Labs reviewed; Na: 147 mmol/l, creatinine: 1.03 mg/dl.  Medications reviewed; 1 tablet multivitamin with minerals/day, 17 g miralax/day, 2 tablets senokot BID.  IVF; D5 @ 75 ml/hr (306 kcal/24 hours).    NUTRITION - FOCUSED PHYSICAL EXAM:  patient deferred  Diet Order:   Diet Order            Diet regular Room service appropriate? Yes; Fluid consistency: Thin  Diet effective now                 EDUCATION NEEDS:   Not appropriate for education at this time  Skin:  Skin Assessment: Reviewed RN Assessment  Last BM:  PTA  Height:   Ht Readings from Last 1 Encounters:  02/03/21 5\' 1"  (1.549 m)    Weight:   Wt Readings from Last 1 Encounters:  02/07/21 60.5 kg     Estimated Nutritional Needs:  Kcal:  1450-1650 Protein:  70-85g Fluid:  1.6L/day      Jarome Matin, MS, RD, LDN, CNSC Inpatient Clinical Dietitian RD pager # available in AMION  After hours/weekend pager # available in [facility]

## 2021-02-08 NOTE — TOC Progression Note (Signed)
Transition of Care [facility]) - Progression Note    Patient Details  Name: Paula Dickson MRN: [id number] Date of Birth: 08-16-1966  Transition of Care [facility]) CM/SW Contact  Purcell Mouton, RN Phone Number: 02/08/2021, 11:15 AM  Clinical Narrative:    Fax pt out to different Psych facilities for bed placement.    Expected Discharge Plan: Psychiatric Hospital Barriers to Discharge: No Barriers Identified  Expected Discharge Plan and Services Expected Discharge Plan: [place] arrangements for the past 2 months: Single Family Home                                       Social Determinants of Health (SDOH) Interventions    Readmission Risk Interventions No flowsheet data found.

## 2021-02-09 DIAGNOSIS — N179 Acute kidney failure, unspecified: Secondary | ICD-10-CM | POA: Diagnosis not present

## 2021-02-09 NOTE — TOC Progression Note (Signed)
Transition of Care [facility]) - Progression Note    Patient Details  Name: Paula Dickson MRN: [id number] Date of Birth: 11/14/1966  Transition of Care [facility]) CM/SW Contact  Purcell Mouton, RN Phone Number: 02/09/2021, 2:59 PM  Clinical Narrative:    There are no Psych bed offers at present time. Pt was fax to several Psych facilities.    Expected Discharge Plan: Psychiatric Hospital Barriers to Discharge: No Barriers Identified  Expected Discharge Plan and Services Expected Discharge Plan: [place] arrangements for the past 2 months: Single Family Home                                       Social Determinants of Health (SDOH) Interventions    Readmission Risk Interventions No flowsheet data found.

## 2021-02-09 NOTE — Progress Notes (Signed)
PROGRESS NOTE    Paula Dickson  ZJI:[id number] DOB: 11/02/1966 DOA: 02/03/2021 PCP: Sandi Mariscal, MD    Brief Narrative:  Paula Dickson is a 55 year old female with past medical history significant for bipolar disorder, schizoaffective disorder, hypothyroidism who was brought to the ED for hallucinations, not eating eating and drinking and noncompliance with her home medications.  Patient without suicidal thoughts or attempts.  Per patient's sister, she has been off of lithium for more than 1 year.  Given patient has not been eating or drinking or taking her home medications, she was involuntarily committed and brought to the ED.  Patient was recently discharged from [facility] in [place] on January 21, 2021.  In the ED, patient was oriented to name and place and following commands.  Afebrile.  Sodium 167, creatinine 3.3, bicarb 19, lactic acid 2.7, WBC 11.  Patient initially was noted to be mildly hypotensive which improved with IV fluid hydration.  Covid-19 test negative.  Hospitalist service consulted for further evaluation and management of acute renal failure and hypernatremia.   Assessment & Plan:   Principal Problem:   AKI (acute kidney injury) ([place]) Active Problems:   Schizophrenia ([place])   Bipolar disorder, most recent episode depressed ([place])   Hypernatremia   Paroxysmal atrial fibrillation (H[facility])   Acute renal failure: Resolved Creatinine 3.38 at time of admission, likely secondary to prerenal azotemia from dehydration. --Cr 3.38>>>1.03 --Continue encourage increased oral intake, fluids --Continue D5 at 31mL/hr  Hypernatremia Patient was sodium 167 on arrival, likely secondary to hypovolemic hypernatremia from severe dehydration. --Na 167>>147 --continue D5 at 75 mL's per hour --Continue to encourage increase oral intake, fluids --Follow BMP daily  Hyperthyroidism TSH <0.010 and FT4 elevated to 2.38.  Thyroid ultrasound  10/2020 with multinodular goiter. --Propranolol 40 mg p.o. twice daily --Methimazole 10 mg p.o. twice daily  Atrial fibrillation with RVR: Resolved On arrival, patient was in sinus tachycardia but repeat EKG showed A. fib with RVR with a rate of 189 bpm.  Patient was initially started on a diltiazem drip with improvement in heart rate.  Etiology likely multifactorial with poor oral intake and severe dehydration and hyperthyroidism in which she is noncompliant with her home medications.  Patient was seen by cardiology and followed during the hospital course.  TTE with LVEF 70-75%, no LV wall motion abnormalities, normal IVC. --Propranolol 40 g p.o. twice daily --Eliquis 5 mg p.o. twice daily --Continue monitor on telemetry  Bipolar disorder Paranoid schizophrenia Recently discharged from [facility] on 01/21/2021.  Patient had not been taking her medications as prescribed.  Patient presented with paranoid hallucinations.  Evaluated by psychiatry. --Continue involuntary IVC --Risperidone 1 mg p.o. twice daily --Hydroxyzine 25 mg PO TID prn --Temazepam 30 mg PO qHS --Tegretol discontinued due to contraindication with NOAC --referral to ACT team --TOC working on placement  Hypokalemia: Resolved Repleted during hospitalization, potassium 3.8 today.   DVT prophylaxis: Eliquis   Code Status: Full Code Family Communication: Updated patient's sister, [name] via telephone this morning  Disposition Plan:  Level of care: Progressive Status is: Inpatient  Remains inpatient appropriate because:Unsafe d/c plan, IV treatments appropriate due to intensity of illness or inability to take PO and Inpatient level of care appropriate due to severity of illness   Dispo: The patient is from: Home              Anticipated d/c is to: Psychiatric hospital  Patient currently is not medically stable to d/c.   Difficult to place patient No    Consultants:   Cardiology,  Dr. Keene Breath - signed off on 02/06/2021  Psychiatry, signed off 02/07/2021  Procedures:   TTE  Antimicrobials:   None   Subjective: Patient seen and examined bedside, resting comfortably.  Sitter present.  Patient requesting to discharge home.  Patient continues to intermittently refuse medications, refused all meds yesterday morning.  Although patient alert and oriented to person/place/time; she is not oriented to her current situation and unable to recall her current medical therapy/medications she takes on a daily basis.  Also reports that she thinks she drinks too much fluids.  No other complaints or concerns at this time.  Denies headache, no chest pain, palpitations, shortness of breath, no abdominal pain.  No acute concerns overnight per nursing staff.  Objective: Vitals:   02/08/21 2225 02/08/21 2300 02/09/21 0000 02/09/21 0400  BP:  (!) 149/92 129/82 104/73  Pulse:  77 82 65  Resp:  13 16 15   Temp: 98.2 F (36.8 C)     TempSrc: Oral     SpO2:  100% 97% 100%  Weight:      Height:        Intake/Output Summary (Last 24 hours) at 02/09/2021 1025 Last data filed at 02/09/2021 9563 Gross per 24 hour  Intake 1765.34 ml  Output --  Net 1765.34 ml   Filed Weights   02/03/21 2044 02/03/21 2322 02/07/21 0400  Weight: 55 kg 55.6 kg 60.5 kg    Examination:  General exam: Appears calm and comfortable  Respiratory system: Clear to auscultation. Respiratory effort normal.  Oxygenating well on room air. Cardiovascular system: S1 & S2 heard, RRR. No JVD, murmurs, rubs, gallops or clicks. No pedal edema. Gastrointestinal system: Abdomen is nondistended, soft and nontender. No organomegaly or masses felt. Normal bowel sounds heard. Central nervous system: Alert and oriented to person/place/time; but not oriented to situation. No focal neurological deficits. Extremities: Symmetric 5 x 5 power. Skin: No rashes, lesions or ulcers Psychiatry: Judgement and insight appear poor. Mood &  affect appropriate.     Data Reviewed: I have personally reviewed following labs and imaging studies  CBC: Recent Labs  Lab 02/03/21 1757 02/03/21 2217 02/05/21 1004 02/06/21 0452 02/07/21 0348 02/08/21 0836  WBC 11.0* 12.9* 7.6 4.9 5.6 5.0  NEUTROABS 8.1*  --   --   --   --  2.3  HGB 16.9* 15.6* 14.5 12.8 13.3 13.1  HCT 54.9* 50.7* 47.0* 40.6 42.8 40.2  MCV 91.8 92.2 90.2 89.6 91.8 87.4  PLT 207 178 125* 112* 75* 875*   Basic Metabolic Panel: Recent Labs  Lab 02/04/21 0426 02/04/21 0956 02/04/21 1003 02/06/21 1022 02/07/21 0348 02/08/21 0836  NA 155*  --  150* 153* 151* 147*  K 3.7  --  3.3* 3.5 5.0 3.8  CL 119*  --  116* 116* 114* 107  CO2 24  --  28 28 28 30   GLUCOSE 221*  --  184* 148* 121* 119*  BUN 83*  --  75* 28* 18 13  CREATININE 2.30*  --  2.03* 1.13* 1.16* 1.03*  CALCIUM 9.9  --  9.9 9.8 10.4* 10.0  MG  --  2.5*  --   --   --  1.8  PHOS  --  2.3*  --   --   --  3.0   GFR: Estimated Creatinine Clearance: 50.3 mL/min (A) (by C-G formula based on  SCr of 1.03 mg/dL (H)). Liver Function Tests: Recent Labs  Lab 02/03/21 1757  AST 11*  ALT 12  ALKPHOS 83  BILITOT 1.1  PROT 7.4  ALBUMIN 4.2   No results for input(s): LIPASE, AMYLASE in the last 168 hours. No results for input(s): AMMONIA in the last 168 hours. Coagulation Profile: Recent Labs  Lab 02/03/21 1757  INR 1.2   Cardiac Enzymes: No results for input(s): CKTOTAL, CKMB, CKMBINDEX, TROPONINI in the last 168 hours. BNP (last 3 results) No results for input(s): PROBNP in the last 8760 hours. HbA1C: No results for input(s): HGBA1C in the last 72 hours. CBG: No results for input(s): GLUCAP in the last 168 hours. Lipid Profile: No results for input(s): CHOL, HDL, LDLCALC, TRIG, CHOLHDL, LDLDIRECT in the last 72 hours. Thyroid Function Tests: No results for input(s): TSH, T4TOTAL, FREET4, T3FREE, THYROIDAB in the last 72 hours. Anemia Panel: No results for input(s): VITAMINB12, FOLATE,  FERRITIN, TIBC, IRON, RETICCTPCT in the last 72 hours. Sepsis Labs: Recent Labs  Lab 02/03/21 1757 02/03/21 1948  LATICACIDVEN 2.7* 2.7*    Recent Results (from the past 240 hour(s))  Urine culture     Status: None   Collection Time: 02/03/21  5:48 PM   Specimen: In/Out Cath Urine  Result Value Ref Range Status   Specimen Description   Final    IN/OUT CATH URINE Performed at [facility], [place] [address], [place], [place] [address]    Special Requests   Final    NONE Performed at [facility], [place] [address] [place], [place] [address]    Culture   Final    NO GROWTH Performed at [facility], [place] [address] [place], [place] [address]    Report Status 02/06/2021 FINAL  Final  SARS CORONAVIRUS 2 (TAT 6-24 HRS) Nasopharyngeal Nasopharyngeal Swab     Status: None   Collection Time: 02/03/21  7:00 PM   Specimen: Nasopharyngeal Swab  Result Value Ref Range Status   SARS Coronavirus 2 NEGATIVE NEGATIVE Final    Comment: (NOTE) SARS-CoV-2 target nucleic acids are NOT DETECTED.  The SARS-CoV-2 RNA is generally detectable in upper and lower respiratory specimens during the acute phase of infection. Negative results do not preclude SARS-CoV-2 infection, do not rule out co-infections with other pathogens, and should not be used as the sole basis for treatment or other patient management decisions. Negative results must be combined with clinical observations, patient history, and epidemiological information. The expected result is Negative.  Fact Sheet for Patients: SugarRoll.be  Fact Sheet for Healthcare Providers: https://www.woods-mathews.com/  This test is not yet approved or cleared by the [place] FDA and  has been authorized for detection and/or diagnosis of SARS-CoV-2 by FDA under an Emergency Use Authorization (EUA). This EUA will remain  in effect (meaning this test can be used)  for the duration of the COVID-19 declaration under Se ction 564(b)(1) of the Act, 21 U.S.C. section 360bbb-3(b)(1), unless the authorization is terminated or revoked sooner.  Performed at [facility], [place] [address], [place], [place] [address]          Radiology Studies: No results found.      Scheduled Meds: . apixaban  5 mg Oral BID  . feeding supplement  237 mL Oral TID BM  . methimazole  10 mg Oral BID  . multivitamin with minerals  1 tablet Oral Daily  . polyethylene glycol  17 g Oral Daily  . propranolol  40 mg Oral BID  . risperidone  1 mg Oral BID  . senna-docusate  2 tablet Oral BID  . temazepam  30 mg Oral QHS   Continuous Infusions: . dextrose 75 mL/hr at 02/09/21 0632     LOS: 6 days    Time spent: 41 minutes spent on chart review, discussion with nursing staff, consultants, updating family and interview/physical exam; more than 50% of that time was spent in counseling and/or coordination of care.    Eupha Lobb J [place] DO [facility] Available via Epic secure chat 7am-7pm After these hours, please refer to coverage provider listed on [email or web address] 02/09/2021, 10:25 AM

## 2021-02-09 NOTE — Plan of Care (Signed)
Pt alert, taken evening PO meds and administerd PRN haldol 1x and will continue plan of care.  Problem: Nutrition: Goal: Adequate nutrition will be maintained Outcome: Progressing

## 2021-02-10 DIAGNOSIS — N179 Acute kidney failure, unspecified: Secondary | ICD-10-CM | POA: Diagnosis not present

## 2021-02-10 LAB — THYROTROPIN RECEPTOR AUTOABS: Thyrotropin Receptor Ab: <1.1 IU/L (ref 0.00–1.75)

## 2021-02-10 LAB — BASIC METABOLIC PANEL
Anion gap: 10 (ref 5–15)
BUN: 10 mg/dL (ref 6–20)
CO2: 27 mmol/L (ref 22–32)
Calcium: 9.3 mg/dL (ref 8.9–10.3)
Chloride: 102 mmol/L (ref 98–111)
Creatinine, Ser: 1.01 mg/dL — ABNORMAL HIGH (ref 0.44–1.00)
GFR, Estimated: >60 mL/min (ref >60)
Glucose, Bld: 103 mg/dL — ABNORMAL HIGH (ref 70–99)
Potassium: 3.5 mmol/L (ref 3.5–5.1)
Sodium: 139 mmol/L (ref 135–145)

## 2021-02-10 NOTE — Progress Notes (Signed)
PT Cancellation Note  Patient Details Name: Paula Dickson MRN: [id number] DOB: [birth year]   Cancelled Treatment:    Reason Eval/Treat Not Completed: Other (comment)per sitter, patient just had a bath,in [place] and was challenging. Patient in bed currently, appears somewhat active. Will check back another time. Patient is ambulating when able to participate.   Claretha Cooper 02/10/2021, 1:44 PM [place] Pager [phone] Office [phone]

## 2021-02-10 NOTE — Progress Notes (Signed)
PROGRESS NOTE    Paula Dickson  YJE:[id number] DOB: Mar 28, 1966 DOA: 02/03/2021 PCP: Sandi Mariscal, MD    Brief Narrative:  Paula Dickson is a 55 year old female with past medical history significant for bipolar disorder, schizoaffective disorder, hypothyroidism who was brought to the ED for hallucinations, not eating eating and drinking and noncompliance with her home medications.  Patient without suicidal thoughts or attempts.  Per patient's sister, she has been off of lithium for more than 1 year.  Given patient has not been eating or drinking or taking her home medications, she was involuntarily committed and brought to the ED.  Patient was recently discharged from [facility] in [place] on January 21, 2021.  In the ED, patient was oriented to name and place and following commands.  Afebrile.  Sodium 167, creatinine 3.3, bicarb 19, lactic acid 2.7, WBC 11.  Patient initially was noted to be mildly hypotensive which improved with IV fluid hydration.  Covid-19 test negative.  Hospitalist service consulted for further evaluation and management of acute renal failure and hypernatremia.   Assessment & Plan:   Principal Problem:   AKI (acute kidney injury) ([place]) Active Problems:   Schizophrenia ([place])   Bipolar disorder, most recent episode depressed ([place])   Hypernatremia   Paroxysmal atrial fibrillation (HCC)   Acute renal failure: Resolved Creatinine 3.38 at time of admission, likely secondary to prerenal azotemia from dehydration. --Cr 3.38>>>1.01 --Continue encourage increased oral intake, fluids --Continue D5 at 13mL/hr  Hypernatremia: resolved Patient was sodium 167 on arrival, likely secondary to hypovolemic hypernatremia from severe dehydration. --Na 167>>139 --continue D5 at 75 mL's per hour --Continue to encourage increase oral intake, fluids --Follow BMP daily  Hyperthyroidism TSH <0.010 and FT4 elevated to 2.38.  Thyroid  ultrasound 10/2020 with multinodular goiter. --Propranolol 40 mg p.o. twice daily --Methimazole 10 mg p.o. twice daily  Atrial fibrillation with RVR: Resolved On arrival, patient was in sinus tachycardia but repeat EKG showed A. fib with RVR with a rate of 189 bpm.  Patient was initially started on a diltiazem drip with improvement in heart rate.  Etiology likely multifactorial with poor oral intake and severe dehydration and hyperthyroidism in which she is noncompliant with her home medications.  Patient was seen by cardiology and followed during the hospital course.  TTE with LVEF 70-75%, no LV wall motion abnormalities, normal IVC. --Propranolol 40 g p.o. twice daily --Eliquis 5 mg p.o. twice daily --Continue monitor on telemetry  Bipolar disorder Paranoid schizophrenia Recently discharged from [facility] on 01/21/2021.  Patient had not been taking her medications as prescribed.  Patient presented with paranoid hallucinations.  Evaluated by psychiatry. --Continue involuntary IVC --Risperidone 1 mg p.o. twice daily --Hydroxyzine 25 mg PO TID prn --Temazepam 30 mg PO qHS --Tegretol discontinued due to contraindication with NOAC --referral to ACT team --TOC working on placement  Hypokalemia: Resolved Repleted during hospitalization, potassium 3.5 today.   DVT prophylaxis: Eliquis   Code Status: Full Code Family Communication: Updated patient's sister, [name] via telephone 3/16  Disposition Plan:  Level of care: Telemetry Status is: Inpatient  Remains inpatient appropriate because:Unsafe d/c plan, IV treatments appropriate due to intensity of illness or inability to take PO and Inpatient level of care appropriate due to severity of illness   Dispo: The patient is from: Home              Anticipated d/c is to: Psychiatric hospital  Patient currently is not medically stable to d/c.   Difficult to place patient No    Consultants:   Cardiology,  Dr. Keene Breath - signed off on 02/06/2021  Psychiatry, signed off 02/07/2021  Procedures:   TTE  Antimicrobials:   None   Subjective: Patient seen and examined bedside, resting comfortably.  Sitter present.  Patient refused her a.m. meds yesterday, did take her p.m. medications although.  Patient continues to have very little insight into her medical problems requiring medications.  She continues to state that she takes too many medications at home and that she drinks too much.  Other than wanting to go home in which she lives alone with poor support system no other complaints or concerns at this time. Denies headache, no chest pain, palpitations, shortness of breath, no abdominal pain.  No acute concerns overnight per nursing staff.  Objective: Vitals:   02/09/21 2000 02/09/21 2224 02/09/21 2338 02/09/21 2342  BP: 140/85 140/85 96/60 122/85  Pulse: 78 89 98 96  Resp: 16  17 (!) 21  Temp:   98.5 F (36.9 C)   TempSrc:   Oral   SpO2: 98%  98% 98%  Weight:      Height:        Intake/Output Summary (Last 24 hours) at 02/10/2021 1118 Last data filed at 02/10/2021 0648 Gross per 24 hour  Intake 1727.9 ml  Output --  Net 1727.9 ml   Filed Weights   02/03/21 2044 02/03/21 2322 02/07/21 0400  Weight: 55 kg 55.6 kg 60.5 kg    Examination:  General exam: Appears calm and comfortable  Respiratory system: Clear to auscultation. Respiratory effort normal.  Oxygenating well on room air. Cardiovascular system: S1 & S2 heard, RRR. No JVD, murmurs, rubs, gallops or clicks. No pedal edema. Gastrointestinal system: Abdomen is nondistended, soft and nontender. No organomegaly or masses felt. Normal bowel sounds heard. Central nervous system: Alert and oriented to person/place/time; but not oriented to situation. No focal neurological deficits. Extremities: Symmetric 5 x 5 power. Skin: No rashes, lesions or ulcers Psychiatry: Judgement and insight appear poor. Mood & affect appropriate.      Data Reviewed: I have personally reviewed following labs and imaging studies  CBC: Recent Labs  Lab 02/03/21 1757 02/03/21 2217 02/05/21 1004 02/06/21 0452 02/07/21 0348 02/08/21 0836  WBC 11.0* 12.9* 7.6 4.9 5.6 5.0  NEUTROABS 8.1*  --   --   --   --  2.3  HGB 16.9* 15.6* 14.5 12.8 13.3 13.1  HCT 54.9* 50.7* 47.0* 40.6 42.8 40.2  MCV 91.8 92.2 90.2 89.6 91.8 87.4  PLT 207 178 125* 112* 75* 709*   Basic Metabolic Panel: Recent Labs  Lab 02/04/21 0956 02/04/21 1003 02/06/21 1022 02/07/21 0348 02/08/21 0836 02/10/21 0940  NA  --  150* 153* 151* 147* 139  K  --  3.3* 3.5 5.0 3.8 3.5  CL  --  116* 116* 114* 107 102  CO2  --  28 28 28 30 27   GLUCOSE  --  184* 148* 121* 119* 103*  BUN  --  75* 28* 18 13 10   CREATININE  --  2.03* 1.13* 1.16* 1.03* 1.01*  CALCIUM  --  9.9 9.8 10.4* 10.0 9.3  MG 2.5*  --   --   --  1.8  --   PHOS 2.3*  --   --   --  3.0  --    GFR: Estimated Creatinine Clearance: 51.3 mL/min (A) (by C-G formula based  on SCr of 1.01 mg/dL (H)). Liver Function Tests: Recent Labs  Lab 02/03/21 1757  AST 11*  ALT 12  ALKPHOS 83  BILITOT 1.1  PROT 7.4  ALBUMIN 4.2   No results for input(s): LIPASE, AMYLASE in the last 168 hours. No results for input(s): AMMONIA in the last 168 hours. Coagulation Profile: Recent Labs  Lab 02/03/21 1757  INR 1.2   Cardiac Enzymes: No results for input(s): CKTOTAL, CKMB, CKMBINDEX, TROPONINI in the last 168 hours. BNP (last 3 results) No results for input(s): PROBNP in the last 8760 hours. HbA1C: No results for input(s): HGBA1C in the last 72 hours. CBG: No results for input(s): GLUCAP in the last 168 hours. Lipid Profile: No results for input(s): CHOL, HDL, LDLCALC, TRIG, CHOLHDL, LDLDIRECT in the last 72 hours. Thyroid Function Tests: No results for input(s): TSH, T4TOTAL, FREET4, T3FREE, THYROIDAB in the last 72 hours. Anemia Panel: No results for input(s): VITAMINB12, FOLATE, FERRITIN, TIBC, IRON,  RETICCTPCT in the last 72 hours. Sepsis Labs: Recent Labs  Lab 02/03/21 1757 02/03/21 1948  LATICACIDVEN 2.7* 2.7*    Recent Results (from the past 240 hour(s))  Urine culture     Status: None   Collection Time: 02/03/21  5:48 PM   Specimen: In/Out Cath Urine  Result Value Ref Range Status   Specimen Description   Final    IN/OUT CATH URINE Performed at [facility], [place] [address] [place], [place] [address]    Special Requests   Final    NONE Performed at [facility], [place] [address] [place], [place] [address]    Culture   Final    NO GROWTH Performed at [facility], [place] [address], [place], [place] [address]    Report Status 02/06/2021 FINAL  Final  SARS CORONAVIRUS 2 (TAT 6-24 HRS) Nasopharyngeal Nasopharyngeal Swab     Status: None   Collection Time: 02/03/21  7:00 PM   Specimen: Nasopharyngeal Swab  Result Value Ref Range Status   SARS Coronavirus 2 NEGATIVE NEGATIVE Final    Comment: (NOTE) SARS-CoV-2 target nucleic acids are NOT DETECTED.  The SARS-CoV-2 RNA is generally detectable in upper and lower respiratory specimens during the acute phase of infection. Negative results do not preclude SARS-CoV-2 infection, do not rule out co-infections with other pathogens, and should not be used as the sole basis for treatment or other patient management decisions. Negative results must be combined with clinical observations, patient history, and epidemiological information. The expected result is Negative.  Fact Sheet for Patients: SugarRoll.be  Fact Sheet for Healthcare Providers: https://www.woods-mathews.com/  This test is not yet approved or cleared by the [place] FDA and  has been authorized for detection and/or diagnosis of SARS-CoV-2 by FDA under an Emergency Use Authorization (EUA). This EUA will remain  in effect (meaning this test can be used) for the duration of  the COVID-19 declaration under Se ction 564(b)(1) of the Act, 21 U.S.C. section 360bbb-3(b)(1), unless the authorization is terminated or revoked sooner.  Performed at [facility], [place] [address], [place], [place] [address]          Radiology Studies: No results found.      Scheduled Meds: . apixaban  5 mg Oral BID  . feeding supplement  237 mL Oral TID BM  . methimazole  10 mg Oral BID  . multivitamin with minerals  1 tablet Oral Daily  . polyethylene glycol  17 g Oral Daily  .  propranolol  40 mg Oral BID  . risperidone  1 mg Oral BID  . senna-docusate  2 tablet Oral BID  . temazepam  30 mg Oral QHS   Continuous Infusions: . dextrose 75 mL/hr at 02/10/21 1108     LOS: 7 days    Time spent: 38 minutes spent on chart review, discussion with nursing staff, consultants, updating family and interview/physical exam; more than 50% of that time was spent in counseling and/or coordination of care.    Elisea Khader J [place] DO [facility] Available via Epic secure chat 7am-7pm After these hours, please refer to coverage provider listed on [email or web address] 02/10/2021, 11:18 AM

## 2021-02-10 NOTE — Plan of Care (Signed)
  Problem: Health Behavior/Discharge Planning: Goal: Ability to manage health-related needs will improve Outcome: Progressing   Problem: Nutrition: Goal: Adequate nutrition will be maintained Outcome: Progressing   Problem: Safety: Goal: Ability to remain free from injury will improve Outcome: Progressing   

## 2021-02-11 DIAGNOSIS — N179 Acute kidney failure, unspecified: Secondary | ICD-10-CM | POA: Diagnosis not present

## 2021-02-11 MED ORDER — HALOPERIDOL LACTATE 5 MG/ML IJ SOLN
5.0000 mg | Freq: Two times a day (BID) | INTRAMUSCULAR | Status: DC
  Administered 2021-02-11 – 2021-02-16 (×5): 5 mg via INTRAMUSCULAR
  Filled 2021-02-11 (×7): qty 1

## 2021-02-11 MED ORDER — HALOPERIDOL 5 MG PO TABS
5.0000 mg | ORAL_TABLET | Freq: Three times a day (TID) | ORAL | Status: DC | PRN
  Filled 2021-02-11: qty 1

## 2021-02-11 MED ORDER — LORAZEPAM 2 MG/ML IJ SOLN
1.0000 mg | Freq: Two times a day (BID) | INTRAMUSCULAR | Status: DC
  Administered 2021-02-11 – 2021-02-18 (×13): 1 mg via INTRAVENOUS
  Filled 2021-02-11 (×15): qty 1

## 2021-02-11 MED ORDER — HALOPERIDOL 5 MG PO TABS
5.0000 mg | ORAL_TABLET | Freq: Two times a day (BID) | ORAL | Status: DC
  Administered 2021-02-11 – 2021-02-17 (×9): 5 mg via ORAL
  Filled 2021-02-11 (×15): qty 1

## 2021-02-11 NOTE — TOC Progression Note (Signed)
Transition of Care [facility]) - Progression Note    Patient Details  Name: Paula Dickson MRN: [id number] Date of Birth: [birth year]  [facility]) CM/SW Contact  Purcell Mouton, RN Phone Number: 02/11/2021, 12:59 PM  Clinical Narrative:    IVC started today 02/11/21 due in 7 days 02/18/21. Pt was also faxed to several  Psych facilities. Also place on [facility] List.    Expected Discharge Plan: Psychiatric Hospital Barriers to Discharge: No Barriers Identified  Expected Discharge Plan and Services Expected Discharge Plan: [place] arrangements for the past 2 months: Single Family Home                                       Social Determinants of Health (SDOH) Interventions    Readmission Risk Interventions No flowsheet data found.

## 2021-02-11 NOTE — Plan of Care (Signed)
Pt alert, refused evening meds. Safety sitter at the bedside and will continue plan of care.  Problem: Skin Integrity: Goal: Risk for impaired skin integrity will decrease Outcome: Progressing   Problem: Safety: Goal: Ability to remain free from injury will improve Outcome: Progressing   Problem: Health Behavior/Discharge Planning: Goal: Ability to manage health-related needs will improve Outcome: Progressing   Problem: Nutrition: Goal: Adequate nutrition will be maintained Outcome: Progressing

## 2021-02-11 NOTE — Care Management Important Message (Signed)
Important Message  Patient Details IM Letter given to the Patient. Name: Paula Dickson MRN: [id number] Date of Birth: August 05, 1966   Medicare Important Message Given:  Yes     Kerin Salen 02/11/2021, 11:14 AM

## 2021-02-11 NOTE — Progress Notes (Signed)
Patient continues to refuse oral medications and due to her psychiatric disorder to include paranoid schizophrenia and bipolar disorder she is unable to comply with medical treatment of her underlying medical conditions to include atrial fibrillation, hyperthyroidism, and acute renal failure.  Patient also due to her psychiatric condition refuses to eat and drink which places her at a higher risk of further deterioration and compromise.  Patient currently does not possess insight into her chronic medical conditions and she is at risk for self-harm and is currently involuntarily committed for such.  Given her poor oral intake, continued refusal to take her prescribed medications, agree with psychiatry to transition her medications to IM/IV route.

## 2021-02-11 NOTE — Plan of Care (Signed)
Patient continues to refuse all meds and assessments.  Uncooperative.  Psych consult pending Problem: Education: Goal: Knowledge of General Education information will improve Description: Including pain rating scale, medication(s)/side effects and non-pharmacologic comfort measures Outcome: Not Progressing   Problem: Health Behavior/Discharge Planning: Goal: Ability to manage health-related needs will improve Outcome: Not Progressing   Problem: Clinical Measurements: Goal: Ability to maintain clinical measurements within normal limits will improve Outcome: Not Progressing Goal: Will remain free from infection Outcome: Not Progressing Goal: Diagnostic test results will improve Outcome: Progressing Goal: Respiratory complications will improve Outcome: Completed/Met Goal: Cardiovascular complication will be avoided Outcome: Progressing

## 2021-02-11 NOTE — Consult Note (Signed)
Paula Dickson is a 55 y.o. female with history of bipolar disorder and schizoaffective disorder who was discharged from [facility] psychiatric facility in [place] on January 21, 2021 as per the patient's sister was brought to the ER after patient since discharge from [facility] has not been eating or drinking or taking any of her medication and also has been having some hallucinations.  No suicidal thoughts or attempts.  Per patient's sister patient has been off lithium for more than a year.  Since patient has not been eating or drinking or taking medications patient was involuntarily committed and brought to the ER.  Psychiatry consult placed for Patient continues to refuse to take her oral medications and is unsafe to discharge due to her psychiatric disorder that is poorly controlled that she has no insight into her medical conditions. Please evaluate for alternative psychiatric treatment that does not interfere with her anticoagulation. Would likely benefit from possibly a long-acting injectable medication. Request to follow along with hospital course until psychiatric condition stable. .  Patient is seen and assessed by this nurse practitioner.  Patient is alert and oriented, calm and cooperative, however today she does not wish to engage with me. She turns her back and pulls the cover over her head. Per chart review she contineus to refuse most psychotropic medications at this time.   Per chart review patient was recently discharged from [facility] less than 3 weeks ago on risperidone, olanzapine, carbamazepine, Restoril, trazodone, and Geodon.  Chart review also indicates the patient was discharged from [facility], as she was not showing any improvement or appear to be responding appropriately to psychotropic medication.   Patient continues to be confused, although does not exhibit any acute psychosis.  She denies any auditory or visual hallucinations.  She does not appear to be responding  to internal stimuli, external stimuli, and or preoccupied.  She also denies any suicidal ideation, homicidal ideation.  She continues to refuse to eat, drink, and or take any of her medications.    As previously indicated patient continues to require treatment for hyper thyroidism (TSH <0.010, Free T4 2.28) , in which she has been started on methimazole 10 mg p.o. twice daily.   Review of MAR shows patient has received this medication as ordered.  Will resume Risperdal 1 mg p.o. twice daily, carbamazepine 150 mg p.o. 3 times daily, hydroxyzine 25 mg p.o. 3 times daily as needed, and temazepam 30 mg p.o. nightly.  Patient does have a history of schizophrenia and bipolar, although she does not appear to be exhibiting any current psychosis, mania, paranoia, delusional thought disorder, and or hallucinations. Patient does not meet inpatient criteria at this time.  Will switch medications over to offer oral or IM medications to target psychiatric symptoms at this time.  -Will order Haldol 5mg  PO/IM BID. Will order Haldol 5mg  po TID prn. This has been communicated with nursing staff and attending [place] Patient is to receive IM Haldol, please limit use of IV Haldol as she continues to be at risk for catatonia. Patient will need oral or IM loading doses prior to considering long acting medications such as Haldol Deconate.  -Will start Ativan 1mg  PO/IV BID for EPS, and catatonia.  -Recommend referral to act team. -Patient does not meet inpatient criteria at this time.  As noted was recently discharged from [facility] about 3 weeks ago with no improvement and or benefit from inpatient hospitalization. Will recommend endocrinology be seen inpatient, as patient has  exhibited difficulty following up outpatient.  -Patient can continue to receive outpatient behavioral health resources at [facility]. -Psychiatry to continue to follow at this time. EKG obtained for tomorrow morning 02/12/21.

## 2021-02-11 NOTE — Progress Notes (Signed)
PROGRESS NOTE    Paula Dickson  HQI:[id number] DOB: July 12, 1966 DOA: 02/03/2021 PCP: Sandi Mariscal, MD    Brief Narrative:  Paula Dickson is a 55 year old female with past medical history significant for bipolar disorder, schizoaffective disorder, hypothyroidism who was brought to the ED for hallucinations, not eating eating and drinking and noncompliance with her home medications.  Patient without suicidal thoughts or attempts.  Per patient's sister, she has been off of lithium for more than 1 year.  Given patient has not been eating or drinking or taking her home medications, she was involuntarily committed and brought to the ED.  Patient was recently discharged from [facility] in [place] on January 21, 2021.  In the ED, patient was oriented to name and place and following commands.  Afebrile.  Sodium 167, creatinine 3.3, bicarb 19, lactic acid 2.7, WBC 11.  Patient initially was noted to be mildly hypotensive which improved with IV fluid hydration.  Covid-19 test negative.  Hospitalist service consulted for further evaluation and management of acute renal failure and hypernatremia.   Assessment & Plan:   Principal Problem:   AKI (acute kidney injury) ([place]) Active Problems:   Schizophrenia ([place])   Bipolar disorder, most recent episode depressed ([place])   Hypernatremia   Paroxysmal atrial fibrillation (HCC)   Acute renal failure: Resolved Creatinine 3.38 at time of admission, likely secondary to prerenal azotemia from dehydration. --Cr 3.38>>>1.01 --Continue encourage increased oral intake, fluids --Continue D5 at 18mL/hr  Hypernatremia: resolved Patient was sodium 167 on arrival, likely secondary to hypovolemic hypernatremia from severe dehydration. --Na 167>>139 --continue D5 at 75 mL's per hour --Continue to encourage increase oral intake, fluids  Hyperthyroidism TSH <0.010 and FT4 elevated to 2.38.  Thyroid ultrasound 10/2020 with  multinodular goiter. --Propranolol 40 mg p.o. twice daily --Methimazole 10 mg p.o. twice daily  Atrial fibrillation with RVR: Resolved On arrival, patient was in sinus tachycardia but repeat EKG showed A. fib with RVR with a rate of 189 bpm.  Patient was initially started on a diltiazem drip with improvement in heart rate.  Etiology likely multifactorial with poor oral intake and severe dehydration and hyperthyroidism in which she is noncompliant with her home medications.  Patient was seen by cardiology and followed during the hospital course.  TTE with LVEF 70-75%, no LV wall motion abnormalities, normal IVC. --Propranolol 40 g p.o. twice daily --Eliquis 5 mg p.o. twice daily --Continue monitor on telemetry  Bipolar disorder Paranoid schizophrenia Recently discharged from [facility] on 01/21/2021.  Patient had not been taking her medications as prescribed.  Patient presented with paranoid hallucinations.  Evaluated by psychiatry. --Continue involuntary IVC --Haldol 5 mg PO BID (5mg  IM if refuses PO) --Hydroxyzine 25 mg PO TID prn anxiety --Temazepam 30 mg PO qHS --TOC working on placement  Hypokalemia: Resolved Repleted during hospitalization   DVT prophylaxis: Eliquis   Code Status: Full Code Family Communication: Updated patient's sister, Paula Dickson via telephone 3/16  Disposition Plan:  Level of care: Telemetry Status is: Inpatient  Remains inpatient appropriate because:Unsafe d/c plan, IV treatments appropriate due to intensity of illness or inability to take PO and Inpatient level of care appropriate due to severity of illness   Dispo: The patient is from: Home              Anticipated d/c is to: Psychiatric hospital              Patient currently is not medically stable to  d/c.   Difficult to place patient No    Consultants:   Cardiology, Dr. Keene Breath - signed off on 02/06/2021  Psychiatry, signed off 02/07/2021  Procedures:    TTE  Antimicrobials:   None   Subjective: Patient seen and examined bedside, resting comfortably.  Sitter present.  Patient asking to go home, does not respond to questions and turns her face to the other side of the bed.  Continues to refuse her medications, has not taken any of her meds over the past 2 days.  Patient continues to be a harm to self given lack of insight into her medical conditions causing her to have recurrent hospitalizations.  Reconsulted psychiatry today for further assistance for possible longer acting injectable medication, now trying Haldol p.o. versus IM.  Given patient's lack of insight into her chronic medical conditions requiring treatment, she is high risk of self-harm and further decompensation and agree with psychiatry's recommendations for IM administration.  No other concerns per nursing staff this morning.   Objective: Vitals:   02/09/21 2338 02/09/21 2342 02/10/21 2200 02/11/21 1343  BP: 96/60 122/85 (!) 134/97 140/88  Pulse: 98 96 67 78  Resp: 17 (!) 21 18 20   Temp: 98.5 F (36.9 C)  97.8 F (36.6 C) 97.7 F (36.5 C)  TempSrc: Oral  Axillary Oral  SpO2: 98% 98% 97% 98%  Weight:      Height:        Intake/Output Summary (Last 24 hours) at 02/11/2021 1407 Last data filed at 02/11/2021 0600 Gross per 24 hour  Intake 765.94 ml  Output -  Net 765.94 ml   Filed Weights   02/03/21 2044 02/03/21 2322 02/07/21 0400  Weight: 55 kg 55.6 kg 60.5 kg    Examination:  General exam: Appears calm and comfortable, despondent Respiratory system: Clear to auscultation. Respiratory effort normal.  Oxygenating well on room air. Cardiovascular system: S1 & S2 heard, RRR. No JVD, murmurs, rubs, gallops or clicks. No pedal edema. Gastrointestinal system: Abdomen is nondistended, soft and nontender. No organomegaly or masses felt. Normal bowel sounds heard. Central nervous system: Alert and oriented to person/place/time; but not oriented to situation. No focal  neurological deficits. Extremities: Symmetric 5 x 5 power. Skin: No rashes, lesions or ulcers Psychiatry: Judgement and insight appear poor. Mood & affect appropriate.     Data Reviewed: I have personally reviewed following labs and imaging studies  CBC: Recent Labs  Lab 02/05/21 1004 02/06/21 0452 02/07/21 0348 02/08/21 0836  WBC 7.6 4.9 5.6 5.0  NEUTROABS  --   --   --  2.3  HGB 14.5 12.8 13.3 13.1  HCT 47.0* 40.6 42.8 40.2  MCV 90.2 89.6 91.8 87.4  PLT 125* 112* 75* 001*   Basic Metabolic Panel: Recent Labs  Lab 02/06/21 1022 02/07/21 0348 02/08/21 0836 02/10/21 0940  NA 153* 151* 147* 139  K 3.5 5.0 3.8 3.5  CL 116* 114* 107 102  CO2 28 28 30 27   GLUCOSE 148* 121* 119* 103*  BUN 28* 18 13 10   CREATININE 1.13* 1.16* 1.03* 1.01*  CALCIUM 9.8 10.4* 10.0 9.3  MG  --   --  1.8  --   PHOS  --   --  3.0  --    GFR: Estimated Creatinine Clearance: 51.3 mL/min (A) (by C-G formula based on SCr of 1.01 mg/dL (H)). Liver Function Tests: No results for input(s): AST, ALT, ALKPHOS, BILITOT, PROT, ALBUMIN in the last 168 hours. No results for input(s): LIPASE, AMYLASE  in the last 168 hours. No results for input(s): AMMONIA in the last 168 hours. Coagulation Profile: No results for input(s): INR, PROTIME in the last 168 hours. Cardiac Enzymes: No results for input(s): CKTOTAL, CKMB, CKMBINDEX, TROPONINI in the last 168 hours. BNP (last 3 results) No results for input(s): PROBNP in the last 8760 hours. HbA1C: No results for input(s): HGBA1C in the last 72 hours. CBG: No results for input(s): GLUCAP in the last 168 hours. Lipid Profile: No results for input(s): CHOL, HDL, LDLCALC, TRIG, CHOLHDL, LDLDIRECT in the last 72 hours. Thyroid Function Tests: No results for input(s): TSH, T4TOTAL, FREET4, T3FREE, THYROIDAB in the last 72 hours. Anemia Panel: No results for input(s): VITAMINB12, FOLATE, FERRITIN, TIBC, IRON, RETICCTPCT in the last 72 hours. Sepsis Labs: No  results for input(s): PROCALCITON, LATICACIDVEN in the last 168 hours.  Recent Results (from the past 240 hour(s))  Urine culture     Status: None   Collection Time: 02/03/21  5:48 PM   Specimen: In/Out Cath Urine  Result Value Ref Range Status   Specimen Description   Final    IN/OUT CATH URINE Performed at [facility] [address], [place], [place] [address]    Special Requests   Final    NONE Performed at [facility], [place] [address] [place], [place] [address]    Culture   Final    NO GROWTH Performed at [facility], [place] [address] [place], [place] [address]    Report Status 02/06/2021 FINAL  Final  SARS CORONAVIRUS 2 (TAT 6-24 HRS) Nasopharyngeal Nasopharyngeal Swab     Status: None   Collection Time: 02/03/21  7:00 PM   Specimen: Nasopharyngeal Swab  Result Value Ref Range Status   SARS Coronavirus 2 NEGATIVE NEGATIVE Final    Comment: (NOTE) SARS-CoV-2 target nucleic acids are NOT DETECTED.  The SARS-CoV-2 RNA is generally detectable in upper and lower respiratory specimens during the acute phase of infection. Negative results do not preclude SARS-CoV-2 infection, do not rule out co-infections with other pathogens, and should not be used as the sole basis for treatment or other patient management decisions. Negative results must be combined with clinical observations, patient history, and epidemiological information. The expected result is Negative.  Fact Sheet for Patients: SugarRoll.be  Fact Sheet for Healthcare Providers: https://www.woods-mathews.com/  This test is not yet approved or cleared by the [place] FDA and  has been authorized for detection and/or diagnosis of SARS-CoV-2 by FDA under an Emergency Use Authorization (EUA). This EUA will remain  in effect (meaning this test can be used) for the duration of the COVID-19 declaration under Se ction 564(b)(1) of  the Act, 21 U.S.C. section 360bbb-3(b)(1), unless the authorization is terminated or revoked sooner.  Performed at [facility], [place] [address] [place], [place] [address]          Radiology Studies: No results found.      Scheduled Meds: . apixaban  5 mg Oral BID  . feeding supplement  237 mL Oral TID BM  . haloperidol  5 mg Oral BID   Or  . haloperidol lactate  5 mg Intramuscular BID  . LORazepam  1 mg Intravenous BID  . methimazole  10 mg Oral BID  . multivitamin with minerals  1 tablet Oral Daily  . polyethylene glycol  17 g Oral Daily  . propranolol  40 mg Oral BID  . senna-docusate  2 tablet Oral BID  .  temazepam  30 mg Oral QHS   Continuous Infusions: . dextrose 75 mL/hr at 02/11/21 0416     LOS: 8 days    Time spent: 38 minutes spent on chart review, discussion with nursing staff, consultants, updating family and interview/physical exam; more than 50% of that time was spent in counseling and/or coordination of care.    Eric J [place] DO [facility] Available via Epic secure chat 7am-7pm After these hours, please refer to coverage provider listed on [email or web address] 02/11/2021, 2:07 PM

## 2021-02-11 NOTE — Progress Notes (Signed)
Patient still unwilling to cooperate and take medications.  Med admin for non-consenting patient policy reviewed.  Psych NP and hospitalist in agreement to move meds to IV/IM in order to medicate patient.  2 Psychologist, educational, Physiological scientist, and CN all at bedside to administer patient using STAR protocol.  Patient initially uncooperative and tried to get up and leave, but with coaching became more relaxed and meds were given with minimal physical intervention.  Patient now more talkative and agreeable with staff.  Now agreeing to eat and got up to [facility] without trying to leave room.  Will continue to monitor and use therapeutic communication with patient.

## 2021-02-11 NOTE — Progress Notes (Signed)
OT Cancellation Note  Patient Details Name: Paula Dickson MRN: [id number] DOB: [birth year]   Cancelled Treatment:    Reason Eval/Treat Not Completed: Other (comment) Upon arrival to room patient laying in bed, not responding to OT's questions/prompts and covering her head with blankets. Per CNA reports taking two people to get her up because she tries to go for the door. Will re-attempt as schedule permits.  Delbert Phenix OT OT pager: [phone]   Rosemary Holms 02/11/2021, 11:08 AM

## 2021-02-11 NOTE — Progress Notes (Signed)
Patient continues to refuse meds/food and uncooperative with conversation.  Just states she wants to go home.  Refused complete assessment, when attempting to listen to lung sounds patient states "no" and holds arms up to push staff away.

## 2021-02-12 DIAGNOSIS — N179 Acute kidney failure, unspecified: Secondary | ICD-10-CM | POA: Diagnosis not present

## 2021-02-12 NOTE — Progress Notes (Signed)
Pt refused to take all AM medications including IM Haldol and IV Ativan. Pt reassured that if she proves to staff and MD that she is willing and capable of taking her medications, that the MD would consider discharging her home. Pt continued to refuse. CN and Security made aware of situation and came to assist at bedside. IV Ativan and IM Haldol were administered with little resistance from patient and minimal physical intervention from staff. Dr. [place] made aware.

## 2021-02-12 NOTE — Progress Notes (Addendum)
Patient refused Vitals at this time.  Patient's nurse nurse notified.

## 2021-02-12 NOTE — Progress Notes (Signed)
[facility] MD Progress Note  02/12/2021 4:35 PM Paula Dickson  MRN:  [id number] Subjective:   Chart reviewed and patient seen. Patient refusing some medications; did take one dose of haldol PO but needed another dose of haldol IM d/t refusal. Spoke to sitter bedside who states that patient did eat today for the first time and appeared to be more interactive; patient was observed by sitter making phone calls -pt called sister and appeared to be having a coherent conversation but was leaving messages that seemed less coherent.  On my interview today, patient is irrritable and guarded. She declines to answer orientation questions. She denies SI/HI/AVH by shaking her head "no" when asked. When other questions were attempted, patient stated, "I don't wanna talk". Interview terminated at that time d/t non participation.    Principal Problem: AKI (acute kidney injury) ([place]) Diagnosis: Principal Problem:   AKI (acute kidney injury) ([place]) Active Problems:   Schizophrenia ([place])   Bipolar disorder, most recent episode depressed ([place])   Hypernatremia   Paroxysmal atrial fibrillation ([place])  Total Time spent with patient: 15 minutes  Past Psychiatric History: schizophrenia  Past Medical History:  Past Medical History:  Diagnosis Date  . Bipolar affective disorder ([place])   . History of arthritis   . History of chicken pox   . History of depression   . History of genital warts   . history of heart murmur   . History of high blood pressure   . History of thyroid disease   . History of UTI   . Hypertension   . Low TSH level 07/13/2017  . Schizophrenia [facility])     Past Surgical History:  Procedure Laterality Date  . ABLATION ON ENDOMETRIOSIS    . CYST REMOVAL NECK     around 11 years ago /benign  . MULTIPLE TOOTH EXTRACTIONS     Family History:  Family History  Problem Relation Age of Onset  . Arthritis Father   . Hyperlipidemia Father   . High blood pressure Father   . Diabetes Sister    . Diabetes Mother   . Diabetes Brother   . Mental illness Brother   . Alcohol abuse Paternal Uncle   . Alcohol abuse Paternal Grandfather   . Breast cancer Maternal Aunt   . Breast cancer Paternal Aunt   . High blood pressure Sister   . Mental illness Other        runs in family   Family Psychiatric  History: see initial consult note Social History:  Social History   Substance and Sexual Activity  Alcohol Use No     Social History   Substance and Sexual Activity  Drug Use No    Social History   Socioeconomic History  . Marital status: Single    Spouse name: Not on file  . Number of children: Not on file  . Years of education: Not on file  . Highest education level: Patient refused  Occupational History  . Not on file  Tobacco Use  . Smoking status: Never Smoker  . Smokeless tobacco: Never Used  Vaping Use  . Vaping Use: Never used  Substance and Sexual Activity  . Alcohol use: No  . Drug use: No  . Sexual activity: Not Currently  Other Topics Concern  . Not on file  Social History Narrative  . Not on file   Social Determinants of Health   Financial Resource Strain: Not on file  Food Insecurity: Not on file  Transportation Needs:  Not on file  Physical Activity: Not on file  Stress: Not on file  Social Connections: Not on file   Additional Social History:                         Sleep: UTA  Appetite:  UTA; has been poor but patient reportedly ate today per sitter  Current Medications: Current Facility-Administered Medications  Medication Dose Route Frequency Provider Last Rate Last Admin  . acetaminophen (TYLENOL) tablet 650 mg  650 mg Oral Q6H PRN [name], MD       Or  . acetaminophen (TYLENOL) suppository 650 mg  650 mg Rectal Q6H PRN [name], MD      . apixaban Arne Cleveland) tablet 5 mg  5 mg Oral BID Hosie Poisson, MD   5 mg at 02/11/21 2220  . dextrose 5 % solution   Intravenous Continuous Hosie Poisson, MD 75  mL/hr at 02/12/21 0444 New Bag at 02/12/21 0444  . feeding supplement (ENSURE ENLIVE / ENSURE PLUS) liquid 237 mL  237 mL Oral TID BM Hosie Poisson, MD   237 mL at 02/11/21 2000  . haloperidol (HALDOL) tablet 5 mg  5 mg Oral BID Suella Broad, FNP   5 mg at 02/11/21 2220   Or  . haloperidol lactate (HALDOL) injection 5 mg  5 mg Intramuscular BID Suella Broad, FNP   5 mg at 02/12/21 1051  . haloperidol (HALDOL) tablet 5 mg  5 mg Oral Q8H PRN Suella Broad, FNP      . hydrOXYzine (ATARAX/VISTARIL) tablet 25 mg  25 mg Oral TID PRN Suella Broad, FNP      . LORazepam (ATIVAN) injection 1 mg  1 mg Intravenous BID Suella Broad, FNP   1 mg at 02/12/21 1052  . methimazole (TAPAZOLE) tablet 10 mg  10 mg Oral BID Hosie Poisson, MD   10 mg at 02/10/21 1108  . multivitamin with minerals tablet 1 tablet  1 tablet Oral Daily Hosie Poisson, MD   1 tablet at 02/07/21 0905  . polyethylene glycol (MIRALAX / GLYCOLAX) packet 17 g  17 g Oral Daily Hosie Poisson, MD   17 g at 02/07/21 0905  . propranolol (INDERAL) tablet 40 mg  40 mg Oral BID Roby Lofts M., PA-C   40 mg at 02/11/21 2220  . senna-docusate (Senokot-S) tablet 2 tablet  2 tablet Oral BID Hosie Poisson, MD   2 tablet at 02/08/21 2300  . temazepam (RESTORIL) capsule 30 mg  30 mg Oral QHS Suella Broad, FNP   30 mg at 02/09/21 2336    Lab Results: No results found for this or any previous visit (from the past 48 hour(s)).  Blood Alcohol level:  Lab Results  Component Value Date   ETH <10 12/29/2020   ETH <10 49/70/2637    Metabolic Disorder Labs: Lab Results  Component Value Date   HGBA1C 6.0 (H) 10/26/2020   MPG 125.5 10/26/2020   MPG 111.15 02/08/2020   Lab Results  Component Value Date   PROLACTIN 11.0 03/14/2019   Lab Results  Component Value Date   CHOL 173 10/26/2020   TRIG 158 (H) 10/26/2020   HDL 46 10/26/2020   CHOLHDL 3.8 10/26/2020   VLDL 32 10/26/2020   [place]  95 10/26/2020   LDLCALC 76 02/08/2020    Physical Findings: AIMS:  , ,  ,  ,    CIWA:    COWS:  Musculoskeletal: Strength & Muscle Tone: did not assess Gait & Station: did not assess Patient leans: sis not assess  Psychiatric Specialty Exam:  Presentation  General Appearance: Appropriate for Environment; Casual  Eye Contact:Minimal  Speech:Normal Rate; Slow; Other (comment) (soft)  Speech Volume:Decreased  Handedness:No data recorded  Mood and Affect  Mood:Irritable  Affect:Appropriate; Congruent   Thought Process  Thought Processes:Other (comment) (poverty of thought. Unable to assess d/t limited participation)  Descriptions of Associations:Intact  Orientation:Full (Time, Place and Person)  Thought Content:WDL  History of Schizophrenia/Schizoaffective disorder:No data recorded Duration of Psychotic Symptoms:-- Pincus Badder)  Hallucinations:Hallucinations: None  Ideas of Reference:None  Suicidal Thoughts:Suicidal Thoughts: No  Homicidal Thoughts:No data recorded  Sensorium  Memory:Immediate Poor; Recent Poor; Remote Poor  Judgment:Impaired  Insight:Lacking   Executive Functions  Concentration:Poor  Attention Span:Poor  Recall:No data recorded Fund of Knowledge:Poor  Language:Fair   Psychomotor Activity  Psychomotor Activity:Psychomotor Activity: Decreased   Assets  Assets:Resilience   Sleep  Sleep:Sleep: -- (UTA)    Physical Exam: Physical Exam Constitutional:      Appearance: Normal appearance. She is normal weight.  HENT:     Head: Normocephalic and atraumatic.  Pulmonary:     Effort: Pulmonary effort is normal.  Neurological:     Mental Status: She is alert.    Review of Systems  Unable to perform ROS: Other (Patient declined to answer questions)   Blood pressure 135/75, pulse 78, temperature 99.2 F (37.3 C), temperature source Oral, resp. rate 16, height 5\' 1"  (1.549 m), weight 60.5 kg, SpO2 98 %. Body mass index is  25.2 kg/m.   Treatment Plan Summary:  -continue Haldol 5mg  PO/IM BID. Will order Haldol 5mg  po TID prn. This has been previously  communicated with nursing staff and attending [place] Patient is to receive IM Haldol, please limit use of IV Haldol as she continues to be at risk for catatonia. Patient will need oral or IM loading doses prior to considering long acting medications such as Haldol Deconate.  -Will start Ativan 1mg  PO/IV BID for EPS, and catatonia.  -Recommend referral to act team. -Patient does not meet inpatient criteria at this time.  As noted was recently discharged from [facility] about 3 weeks ago with no improvement and or benefit from inpatient hospitalization. Will recommend endocrinology be seen inpatient, as patient has exhibited difficulty following up outpatient.  -Patient can continue to receive outpatient behavioral health resources at [facility] -EKG 02/12/21- qtc 457  -Recommendations communicated to Dr. [place] via epic secure chat. Psychiatry will follow up on Monday   Ival Bible, MD 02/12/2021, 4:35 PM

## 2021-02-12 NOTE — Progress Notes (Signed)
PROGRESS NOTE    Paula Dickson  HFW:[id number] DOB: [birth year] DOA: 02/03/2021 PCP: Sandi Mariscal, MD    Brief Narrative:  Paula Dickson is a 55 year old female with past medical history significant for bipolar disorder, schizoaffective disorder, hypothyroidism who was brought to the ED for hallucinations, not eating eating and drinking and noncompliance with her home medications.  Patient without suicidal thoughts or attempts.  Per patient's sister, she has been off of lithium for more than 1 year.  Given patient has not been eating or drinking or taking her home medications, she was involuntarily committed and brought to the ED.  Patient was recently discharged from [facility] in [place] on January 21, 2021.  In the ED, patient was oriented to name and place and following commands.  Afebrile.  Sodium 167, creatinine 3.3, bicarb 19, lactic acid 2.7, WBC 11.  Patient initially was noted to be mildly hypotensive which improved with IV fluid hydration.  Covid-19 test negative.  Hospitalist service consulted for further evaluation and management of acute renal failure and hypernatremia.   Assessment & Plan:   Principal Problem:   AKI (acute kidney injury) ([place]) Active Problems:   Schizophrenia ([place])   Bipolar disorder, most recent episode depressed ([place])   Hypernatremia   Paroxysmal atrial fibrillation (HCC)   Acute renal failure: Resolved Creatinine 3.38 at time of admission, likely secondary to prerenal azotemia from dehydration. --Cr 3.38>>>1.01 --Continue encourage increased oral intake, fluids --Continue D5 at 58mL/hr  Hypernatremia: resolved Patient was sodium 167 on arrival, likely secondary to hypovolemic hypernatremia from severe dehydration. --Na 167>>139 --continue D5 at 75 mL's per hour --Continue to encourage increase oral intake, fluids  Hyperthyroidism TSH <0.010 and FT4 elevated to 2.38.  Thyroid ultrasound 10/2020 with  multinodular goiter. --Propranolol 40 mg p.o. twice daily --Methimazole 10 mg p.o. twice daily  Atrial fibrillation with RVR: Resolved On arrival, patient was in sinus tachycardia but repeat EKG showed A. fib with RVR with a rate of 189 bpm.  Patient was initially started on a diltiazem drip with improvement in heart rate.  Etiology likely multifactorial with poor oral intake and severe dehydration and hyperthyroidism in which she is noncompliant with her home medications.  Patient was seen by cardiology and followed during the hospital course.  TTE with LVEF 70-75%, no LV wall motion abnormalities, normal IVC. --Propranolol 40 mg p.o. twice daily --Eliquis 5 mg p.o. twice daily --Repeat EKG this morning shows now converted back to normal sinus rhythm, discontinue telemetry  Bipolar disorder Paranoid schizophrenia Recently discharged from [facility] hospital on 01/21/2021.  Patient had not been taking her medications as prescribed.  Patient presented with paranoid hallucinations.  Evaluated by psychiatry. --Continue involuntary IVC, Expires 02/18/21 --Haldol 5 mg PO BID (5mg  IM if refuses PO) --Hydroxyzine 25 mg PO TID prn anxiety --Temazepam 30 mg PO qHS --TOC working on placement  Hypokalemia: Resolved Repleted during hospitalization   DVT prophylaxis: Eliquis   Code Status: Full Code Family Communication: No family present at bedside this morning  Disposition Plan:  Level of care: Telemetry Status is: Inpatient  Remains inpatient appropriate because:Unsafe d/c plan, IV treatments appropriate due to intensity of illness or inability to take PO and Inpatient level of care appropriate due to severity of illness   Dispo: The patient is from: Home              Anticipated d/c is to: Psychiatric hospital  Patient currently is not medically stable to d/c.   Difficult to place patient No    Consultants:   Cardiology, Dr. Keene Breath - signed off on  02/06/2021  Psychiatry  Procedures:   TTE  Antimicrobials:   None   Subjective: Patient seen and examined bedside, resting comfortably.  Sitter present.  Patient states "does not feel well".  Has not ordered breakfast yet.  Continues to refuse much of her medications with poor oral intake.  Patient now on IM Haldol due to her noncompliance.  Continues to lack insight into her medical conditions.  No other complaints or concerns at this time.  Denies headache, no chest pain, no palpitations.  Repeat EKG notes conversion to normal sinus rhythm.  No acute concerns per nursing staff this morning.  Objective: Vitals:   02/10/21 2200 02/11/21 1343 02/11/21 1954 02/12/21 0441  BP: (!) 134/97 140/88 135/90 134/81  Pulse: 67 78 85 78  Resp: 18 20 18 15   Temp: 97.8 F (36.6 C) 97.7 F (36.5 C) 97.8 F (36.6 C) 97.7 F (36.5 C)  TempSrc: Axillary Oral Oral Oral  SpO2: 97% 98% 99% 100%  Weight:      Height:        Intake/Output Summary (Last 24 hours) at 02/12/2021 1101 Last data filed at 02/11/2021 2300 Gross per 24 hour  Intake 995.07 ml  Output -  Net 995.07 ml   Filed Weights   02/03/21 2044 02/03/21 2322 02/07/21 0400  Weight: 55 kg 55.6 kg 60.5 kg    Examination:  General exam: Appears calm and comfortable, despondent Respiratory system: Clear to auscultation. Respiratory effort normal.  Oxygenating well on room air. Cardiovascular system: S1 & S2 heard, RRR. No JVD, murmurs, rubs, gallops or clicks. No pedal edema. Gastrointestinal system: Abdomen is nondistended, soft and nontender. No organomegaly or masses felt. Normal bowel sounds heard. Central nervous system: Alert and oriented to person/place/time; but not oriented to situation. No focal neurological deficits. Extremities: Symmetric 5 x 5 power. Skin: No rashes, lesions or ulcers Psychiatry: Judgement and insight appear extremely poor. Mood & affect appropriate.     Data Reviewed: I have personally reviewed  following labs and imaging studies  CBC: Recent Labs  Lab 02/05/21 1004 02/06/21 0452 02/07/21 0348 02/08/21 0836  WBC 7.6 4.9 5.6 5.0  NEUTROABS  --   --   --  2.3  HGB 14.5 12.8 13.3 13.1  HCT 47.0* 40.6 42.8 40.2  MCV 90.2 89.6 91.8 87.4  PLT 125* 112* 75* 188*   Basic Metabolic Panel: Recent Labs  Lab 02/06/21 1022 02/07/21 0348 02/08/21 0836 02/10/21 0940  NA 153* 151* 147* 139  K 3.5 5.0 3.8 3.5  CL 116* 114* 107 102  CO2 28 28 30 27   GLUCOSE 148* 121* 119* 103*  BUN 28* 18 13 10   CREATININE 1.13* 1.16* 1.03* 1.01*  CALCIUM 9.8 10.4* 10.0 9.3  MG  --   --  1.8  --   PHOS  --   --  3.0  --    GFR: Estimated Creatinine Clearance: 51.3 mL/min (A) (by C-G formula based on SCr of 1.01 mg/dL (H)). Liver Function Tests: No results for input(s): AST, ALT, ALKPHOS, BILITOT, PROT, ALBUMIN in the last 168 hours. No results for input(s): LIPASE, AMYLASE in the last 168 hours. No results for input(s): AMMONIA in the last 168 hours. Coagulation Profile: No results for input(s): INR, PROTIME in the last 168 hours. Cardiac Enzymes: No results for input(s): CKTOTAL, CKMB,  CKMBINDEX, TROPONINI in the last 168 hours. BNP (last 3 results) No results for input(s): PROBNP in the last 8760 hours. HbA1C: No results for input(s): HGBA1C in the last 72 hours. CBG: No results for input(s): GLUCAP in the last 168 hours. Lipid Profile: No results for input(s): CHOL, HDL, LDLCALC, TRIG, CHOLHDL, LDLDIRECT in the last 72 hours. Thyroid Function Tests: No results for input(s): TSH, T4TOTAL, FREET4, T3FREE, THYROIDAB in the last 72 hours. Anemia Panel: No results for input(s): VITAMINB12, FOLATE, FERRITIN, TIBC, IRON, RETICCTPCT in the last 72 hours. Sepsis Labs: No results for input(s): PROCALCITON, LATICACIDVEN in the last 168 hours.  Recent Results (from the past 240 hour(s))  Urine culture     Status: None   Collection Time: 02/03/21  5:48 PM   Specimen: In/Out Cath Urine   Result Value Ref Range Status   Specimen Description   Final    IN/OUT CATH URINE Performed at [facility] [address] [place], [place] [address]    Special Requests   Final    NONE Performed at [facility], [place] [address], [place], [place] [address]    Culture   Final    NO GROWTH Performed at [facility], [place] [address] [place], [place] [address]    Report Status 02/06/2021 FINAL  Final  SARS CORONAVIRUS 2 (TAT 6-24 HRS) Nasopharyngeal Nasopharyngeal Swab     Status: None   Collection Time: 02/03/21  7:00 PM   Specimen: Nasopharyngeal Swab  Result Value Ref Range Status   SARS Coronavirus 2 NEGATIVE NEGATIVE Final    Comment: (NOTE) SARS-CoV-2 target nucleic acids are NOT DETECTED.  The SARS-CoV-2 RNA is generally detectable in upper and lower respiratory specimens during the acute phase of infection. Negative results do not preclude SARS-CoV-2 infection, do not rule out co-infections with other pathogens, and should not be used as the sole basis for treatment or other patient management decisions. Negative results must be combined with clinical observations, patient history, and epidemiological information. The expected result is Negative.  Fact Sheet for Patients: SugarRoll.be  Fact Sheet for Healthcare Providers: https://www.woods-mathews.com/  This test is not yet approved or cleared by the [place] FDA and  has been authorized for detection and/or diagnosis of SARS-CoV-2 by FDA under an Emergency Use Authorization (EUA). This EUA will remain  in effect (meaning this test can be used) for the duration of the COVID-19 declaration under Se ction 564(b)(1) of the Act, 21 U.S.C. section 360bbb-3(b)(1), unless the authorization is terminated or revoked sooner.  Performed at [facility], [place] [address] [place],  [address]          Radiology  Studies: No results found.      Scheduled Meds: . apixaban  5 mg Oral BID  . feeding supplement  237 mL Oral TID BM  . haloperidol  5 mg Oral BID   Or  . haloperidol lactate  5 mg Intramuscular BID  . LORazepam  1 mg Intravenous BID  . methimazole  10 mg Oral BID  . multivitamin with minerals  1 tablet Oral Daily  . polyethylene glycol  17 g Oral Daily  . propranolol  40 mg Oral BID  . senna-docusate  2 tablet Oral BID  . temazepam  30 mg Oral QHS   Continuous Infusions: . dextrose 75 mL/hr at 02/12/21 0444     LOS: 9 days    Time spent: 38 minutes spent on chart review, discussion  with nursing staff, consultants, updating family and interview/physical exam; more than 50% of that time was spent in counseling and/or coordination of care.    Eric J [place] DO [facility] Available via Epic secure chat 7am-7pm After these hours, please refer to coverage provider listed on [email or web address] 02/12/2021, 11:01 AM

## 2021-02-13 DIAGNOSIS — N179 Acute kidney failure, unspecified: Secondary | ICD-10-CM | POA: Diagnosis not present

## 2021-02-13 NOTE — Plan of Care (Signed)
  Problem: Education: Goal: Knowledge of General Education information will improve Description: Including pain rating scale, medication(s)/side effects and non-pharmacologic comfort measures Outcome: Not Progressing   Problem: Health Behavior/Discharge Planning: Goal: Ability to manage health-related needs will improve Outcome: Not Progressing   Problem: Clinical Measurements: Goal: Ability to maintain clinical measurements within normal limits will improve Outcome: Progressing Goal: Will remain free from infection Outcome: Progressing Goal: Diagnostic test results will improve Outcome: Progressing Goal: Cardiovascular complication will be avoided Outcome: Progressing   Problem: Activity: Goal: Risk for activity intolerance will decrease Outcome: Progressing   Problem: Nutrition: Goal: Adequate nutrition will be maintained Outcome: Progressing   Problem: Coping: Goal: Level of anxiety will decrease Outcome: Not Progressing   Problem: Elimination: Goal: Will not experience complications related to bowel motility Outcome: Completed/Met Goal: Will not experience complications related to urinary retention Outcome: Completed/Met   Problem: Pain Managment: Goal: General experience of comfort will improve Outcome: Completed/Met   Problem: Safety: Goal: Ability to remain free from injury will improve Outcome: Not Progressing   Problem: Skin Integrity: Goal: Risk for impaired skin integrity will decrease Outcome: Progressing

## 2021-02-13 NOTE — Progress Notes (Signed)
PROGRESS NOTE    Paula Dickson  DQQ:[id number] DOB: 1966/07/08 DOA: 02/03/2021 PCP: Sandi Mariscal, MD    Brief Narrative:  Paula Dickson is a 55 year old female with past medical history significant for bipolar disorder, schizoaffective disorder, hypothyroidism who was brought to the ED for hallucinations, not eating eating and drinking and noncompliance with her home medications.  Patient without suicidal thoughts or attempts.  Per patient's sister, she has been off of lithium for more than 1 year.  Given patient has not been eating or drinking or taking her home medications, she was involuntarily committed and brought to the ED.  Patient was recently discharged from [facility] in [place] on January 21, 2021.  In the ED, patient was oriented to name and place and following commands.  Afebrile.  Sodium 167, creatinine 3.3, bicarb 19, lactic acid 2.7, WBC 11.  Patient initially was noted to be mildly hypotensive which improved with IV fluid hydration.  Covid-19 test negative.  Hospitalist service consulted for further evaluation and management of acute renal failure and hypernatremia.   Assessment & Plan:   Principal Problem:   AKI (acute kidney injury) ([place]) Active Problems:   Schizophrenia ([place])   Bipolar disorder, most recent episode depressed ([place])   Hypernatremia   Paroxysmal atrial fibrillation (HCC)   Acute renal failure: Resolved Creatinine 3.38 at time of admission, likely secondary to prerenal azotemia from dehydration. --Cr 3.38>>>1.01 --Continue encourage increased oral intake, fluids --Discontinue IV fluids today --Repeat BMP in the a.m.  Hypernatremia: resolved Patient was sodium 167 on arrival, likely secondary to hypovolemic hypernatremia from severe dehydration. --Na 167>>139 --Discontinue IV fluids today --Continue to encourage increase oral intake, fluids --Repeat BMP in a.m.  Hyperthyroidism TSH <0.010 and FT4  elevated to 2.38.  Thyroid ultrasound 10/2020 with multinodular goiter. --Propranolol 40 mg p.o. twice daily --Methimazole 10 mg p.o. twice daily  Atrial fibrillation with RVR: Resolved On arrival, patient was in sinus tachycardia but repeat EKG showed A. fib with RVR with a rate of 189 bpm.  Patient was initially started on a diltiazem drip with improvement in heart rate.  Etiology likely multifactorial with poor oral intake and severe dehydration and hyperthyroidism in which she is noncompliant with her home medications.  Patient was seen by cardiology and followed during the hospital course.  TTE with LVEF 70-75%, no LV wall motion abnormalities, normal IVC. --Propranolol 40 mg p.o. twice daily --Eliquis 5 mg p.o. twice daily --Repeat EKG 3/19 shows now converted back to normal sinus rhythm, discontinued telemetry  Bipolar disorder Paranoid schizophrenia Recently discharged from [facility] hospital on 01/21/2021.  Patient had not been taking her medications as prescribed.  Patient presented with paranoid hallucinations.  Evaluated by psychiatry. --Continue involuntary IVC, Expires 02/18/21 --Haldol 5 mg PO BID (5mg  IM if refuses PO) --Hydroxyzine 25 mg PO TID prn anxiety --Temazepam 30 mg PO qHS --TOC working on placement  Hypokalemia: Resolved Repleted during hospitalization   DVT prophylaxis: Eliquis   Code Status: Full Code Family Communication: No family present at bedside this morning  Disposition Plan:  Level of care: Telemetry Status is: Inpatient  Remains inpatient appropriate because:Unsafe d/c plan, IV treatments appropriate due to intensity of illness or inability to take PO and Inpatient level of care appropriate due to severity of illness   Dispo: The patient is from: Home              Anticipated d/c is to: Psychiatric hospital  Patient currently is not medically stable to d/c.   Difficult to place patient No    Consultants:    Cardiology, Dr. Keene Breath - signed off on 02/06/2021  Psychiatry  Procedures:   TTE  Antimicrobials:   None   Subjective: Patient seen and examined bedside, resting comfortably.  Sitter present.  Patient continues to report "not feeling well".  States "I just want to die".  Patient continues to refuse her medications.  She states "I just need to be clean".  Patient continues to be paranoid and lacks insight into her chronic medical conditions which puts her at high risk for decompensation given her lack of adherence to her medications.  Patient has been drinking more fluids over the last 24 hours but poor oral intake otherwise.  No acute concerns per nursing staff this morning.  Objective: Vitals:   02/11/21 1954 02/12/21 0441 02/12/21 1548 02/13/21 0624  BP: 135/90 134/81 135/75 119/83  Pulse:  78 78 77  Resp: 18 15 16 17   Temp:  97.7 F (36.5 C) 99.2 F (37.3 C) 98.8 F (37.1 C)  TempSrc: Oral Oral Oral Oral  SpO2: 99% 100% 98% 95%  Weight:      Height:        Intake/Output Summary (Last 24 hours) at 02/13/2021 1055 Last data filed at 02/13/2021 1000 Gross per 24 hour  Intake 3862.69 ml  Output 700 ml  Net 3162.69 ml   Filed Weights   02/03/21 2044 02/03/21 2322 02/07/21 0400  Weight: 55 kg 55.6 kg 60.5 kg    Examination:  General exam: Appears calm and comfortable, despondent, paranoid, using medications Respiratory system: Clear to auscultation. Respiratory effort normal.  Oxygenating well on room air. Cardiovascular system: S1 & S2 heard, RRR. No JVD, murmurs, rubs, gallops or clicks. No pedal edema. Gastrointestinal system: Abdomen is nondistended, soft and nontender. No organomegaly or masses felt. Normal bowel sounds heard. Central nervous system: Alert and oriented to person/place/time; but not oriented to situation. No focal neurological deficits. Extremities: Symmetric 5 x 5 power. Skin: No rashes, lesions or ulcers Psychiatry: Judgement and insight  appear extremely poor.  Paranoid, depressed    Data Reviewed: I have personally reviewed following labs and imaging studies  CBC: Recent Labs  Lab 02/07/21 0348 02/08/21 0836  WBC 5.6 5.0  NEUTROABS  --  2.3  HGB 13.3 13.1  HCT 42.8 40.2  MCV 91.8 87.4  PLT 75* 211*   Basic Metabolic Panel: Recent Labs  Lab 02/07/21 0348 02/08/21 0836 02/10/21 0940  NA 151* 147* 139  K 5.0 3.8 3.5  CL 114* 107 102  CO2 28 30 27   GLUCOSE 121* 119* 103*  BUN 18 13 10   CREATININE 1.16* 1.03* 1.01*  CALCIUM 10.4* 10.0 9.3  MG  --  1.8  --   PHOS  --  3.0  --    GFR: Estimated Creatinine Clearance: 51.3 mL/min (A) (by C-G formula based on SCr of 1.01 mg/dL (H)). Liver Function Tests: No results for input(s): AST, ALT, ALKPHOS, BILITOT, PROT, ALBUMIN in the last 168 hours. No results for input(s): LIPASE, AMYLASE in the last 168 hours. No results for input(s): AMMONIA in the last 168 hours. Coagulation Profile: No results for input(s): INR, PROTIME in the last 168 hours. Cardiac Enzymes: No results for input(s): CKTOTAL, CKMB, CKMBINDEX, TROPONINI in the last 168 hours. BNP (last 3 results) No results for input(s): PROBNP in the last 8760 hours. HbA1C: No results for input(s): HGBA1C in the last  72 hours. CBG: No results for input(s): GLUCAP in the last 168 hours. Lipid Profile: No results for input(s): CHOL, HDL, LDLCALC, TRIG, CHOLHDL, LDLDIRECT in the last 72 hours. Thyroid Function Tests: No results for input(s): TSH, T4TOTAL, FREET4, T3FREE, THYROIDAB in the last 72 hours. Anemia Panel: No results for input(s): VITAMINB12, FOLATE, FERRITIN, TIBC, IRON, RETICCTPCT in the last 72 hours. Sepsis Labs: No results for input(s): PROCALCITON, LATICACIDVEN in the last 168 hours.  Recent Results (from the past 240 hour(s))  Urine culture     Status: None   Collection Time: 02/03/21  5:48 PM   Specimen: In/Out Cath Urine  Result Value Ref Range Status   Specimen Description    Final    IN/OUT CATH URINE Performed at [facility] [address] [place], [place] [address]    Special Requests   Final    NONE Performed at [facility], [place] [address] [place], [place] [address]    Culture   Final    NO GROWTH Performed at [facility], [place] [address] [place], [place] [address]    Report Status 02/06/2021 FINAL  Final  SARS CORONAVIRUS 2 (TAT 6-24 HRS) Nasopharyngeal Nasopharyngeal Swab     Status: None   Collection Time: 02/03/21  7:00 PM   Specimen: Nasopharyngeal Swab  Result Value Ref Range Status   SARS Coronavirus 2 NEGATIVE NEGATIVE Final    Comment: (NOTE) SARS-CoV-2 target nucleic acids are NOT DETECTED.  The SARS-CoV-2 RNA is generally detectable in upper and lower respiratory specimens during the acute phase of infection. Negative results do not preclude SARS-CoV-2 infection, do not rule out co-infections with other pathogens, and should not be used as the sole basis for treatment or other patient management decisions. Negative results must be combined with clinical observations, patient history, and epidemiological information. The expected result is Negative.  Fact Sheet for Patients: SugarRoll.be  Fact Sheet for Healthcare Providers: https://www.woods-mathews.com/  This test is not yet approved or cleared by the [place] FDA and  has been authorized for detection and/or diagnosis of SARS-CoV-2 by FDA under an Emergency Use Authorization (EUA). This EUA will remain  in effect (meaning this test can be used) for the duration of the COVID-19 declaration under Se ction 564(b)(1) of the Act, 21 U.S.C. section 360bbb-3(b)(1), unless the authorization is terminated or revoked sooner.  Performed at [facility], [place] [address] [place], [place] [address]          Radiology Studies: No results found.      Scheduled Meds: . apixaban   5 mg Oral BID  . feeding supplement  237 mL Oral TID BM  . haloperidol  5 mg Oral BID   Or  . haloperidol lactate  5 mg Intramuscular BID  . LORazepam  1 mg Intravenous BID  . methimazole  10 mg Oral BID  . multivitamin with minerals  1 tablet Oral Daily  . polyethylene glycol  17 g Oral Daily  . propranolol  40 mg Oral BID  . senna-docusate  2 tablet Oral BID  . temazepam  30 mg Oral QHS   Continuous Infusions:    LOS: 10 days    Time spent: 35 minutes spent on chart review, discussion with nursing staff, consultants, updating family and interview/physical exam; more than 50% of that time was spent in counseling and/or coordination of care.    Rhian Funari J [place] DO [facility] Available via Epic secure chat 7am-7pm  After these hours, please refer to coverage provider listed on [email or web address] 02/13/2021, 10:55 AM

## 2021-02-13 NOTE — Progress Notes (Addendum)
Pt refusing to take AM medications. Pt encouraged to take both oral and IV/IM medications so that she could hopefully discharge home soon. Pt continued to refuse. CN made aware and assisted this writer in administering IV Ativan and IM Haldol. Pt required minimal physical intervention by staff and allowed medications to be given. Dr. [place] made aware.

## 2021-02-14 DIAGNOSIS — N179 Acute kidney failure, unspecified: Secondary | ICD-10-CM | POA: Diagnosis not present

## 2021-02-14 LAB — CBC
HCT: 38.2 % (ref 36.0–46.0)
Hemoglobin: 12.3 g/dL (ref 12.0–15.0)
MCH: 28.3 pg (ref 26.0–34.0)
MCHC: 32.2 g/dL (ref 30.0–36.0)
MCV: 87.8 fL (ref 80.0–100.0)
Platelets: 184 10*3/uL (ref 150–400)
RBC: 4.35 MIL/uL (ref 3.87–5.11)
RDW: 12.4 % (ref 11.5–15.5)
WBC: 5.7 10*3/uL (ref 4.0–10.5)
nRBC: 0 % (ref 0.0–0.2)

## 2021-02-14 LAB — COMPREHENSIVE METABOLIC PANEL
ALT: 26 U/L (ref 0–44)
AST: 18 U/L (ref 15–41)
Albumin: 3.1 g/dL — ABNORMAL LOW (ref 3.5–5.0)
Alkaline Phosphatase: 64 U/L (ref 38–126)
Anion gap: 9 (ref 5–15)
BUN: 7 mg/dL (ref 6–20)
CO2: 26 mmol/L (ref 22–32)
Calcium: 9.2 mg/dL (ref 8.9–10.3)
Chloride: 103 mmol/L (ref 98–111)
Creatinine, Ser: 1.05 mg/dL — ABNORMAL HIGH (ref 0.44–1.00)
GFR, Estimated: >60 mL/min (ref >60)
Glucose, Bld: 86 mg/dL (ref 70–99)
Potassium: 3.1 mmol/L — ABNORMAL LOW (ref 3.5–5.1)
Sodium: 138 mmol/L (ref 135–145)
Total Bilirubin: 0.8 mg/dL (ref 0.3–1.2)
Total Protein: 5.6 g/dL — ABNORMAL LOW (ref 6.5–8.1)

## 2021-02-14 LAB — MAGNESIUM: Magnesium: 1.8 mg/dL (ref 1.7–2.4)

## 2021-02-14 MED ORDER — MAGNESIUM SULFATE 2 GM/50ML IV SOLN
2.0000 g | Freq: Once | INTRAVENOUS | Status: AC
  Administered 2021-02-14: 2 g via INTRAVENOUS
  Filled 2021-02-14: qty 50

## 2021-02-14 MED ORDER — SODIUM CHLORIDE 0.9 % IV SOLN
INTRAVENOUS | Status: DC | PRN
  Administered 2021-02-14: 250 mL via INTRAVENOUS

## 2021-02-14 MED ORDER — POTASSIUM CHLORIDE 10 MEQ/100ML IV SOLN
10.0000 meq | INTRAVENOUS | Status: AC
  Administered 2021-02-14 (×6): 10 meq via INTRAVENOUS
  Filled 2021-02-14 (×6): qty 100

## 2021-02-14 NOTE — Consult Note (Signed)
[place] [facility] Consult   Reason for Consult:  Medication Noncompliance Referring Physician:  Dr. [place] Patient Identification: Paula Dickson MRN:  [id number] Principal Diagnosis: AKI (acute kidney injury) [facility]) Diagnosis:  Principal Problem:   AKI (acute kidney injury) ([place]) Active Problems:   Schizophrenia ([place])   Bipolar disorder, most recent episode depressed ([place])   Hypernatremia   Paroxysmal atrial fibrillation ([place])   Total Time spent with patient: 30 minutes  Subjective:   Paula Dickson is a 55 y.o. female patient admitted with poor oral intake, refusing to eat or drink, refusing medication, and endorsing hallucinations.  On evaluation patient is alert and oriented x2, calm and cooperative, initially shows willingness to participate.  Patient states she did take some of her medications, and did eat her breakfast this morning.  This was confirmed by her sitter who is also at the bedside.  Patient continues to ruminate about going home, although she is unable to identify her home address or where she was residing prior to this admission.  Patient does make note that" my sister does not want me back there with her."  When assessing for auditory or visual hallucinations patient is able to nod her head, however would not provide any additional details or information.  She denies suicidal ideation and homicidal ideation.  On evaluation today, patient remains bizarre and guarded.  She admits to some progress to include eating her breakfast and taking some of her medications.  Although she originally stated she refused her medications, she later states she took her medications.  On today's visit she is able to show some improvement, albeit coherent and able to hold a linear conversation.  She admits to hallucinations, although she is unable to elaborate or provide details.  Patient does appear to be hopeful to go home, she is encouraged to continue to eat and take  her medication.  She denies suicidal ideation and or homicidal ideation.  HPI:  Paula Perham Matieris a 55 y.o.femalewithhistory of bipolar disorder and schizoaffective disorder who was discharged from [facility] psychiatric facility in [place] on January 21, 2021 as per the patient's sister was brought to the ER after patient since discharge from [facility] has not been eating or drinking or taking any of her medication and also has been having some hallucinations. No suicidal thoughts or attempts. Per patient's sister patient has been off lithium for more than a year. Since patient has not been eating or drinking or taking medications patient was involuntarily committed and brought to the ER.   Past Psychiatric History: Schizophrenia, bipolar disorder.  Trial and failed multiple psychotropic medication to include Haldol, risperidone, Haldol decanoate, Cogentin, carbamazepine, clozapine, hydroxyzine, temazepam.   Risk to Self:  Denies Risk to Others:   Denies Prior Inpatient Therapy:  Recent discharge from [facility] 02/25, [facility] x3 in 2021 multiple ED visits for schizophrenia, combative behavior.  Prior Outpatient Therapy:  [name]  Past Medical History:  Past Medical History:  Diagnosis Date  . Bipolar affective disorder ([place])   . History of arthritis   . History of chicken pox   . History of depression   . History of genital warts   . history of heart murmur   . History of high blood pressure   . History of thyroid disease   . History of UTI   . Hypertension   . Low TSH level 07/13/2017  . Schizophrenia [facility])     Past Surgical History:  Procedure Laterality Date  . ABLATION  ON ENDOMETRIOSIS    . CYST REMOVAL NECK     around 11 years ago /benign  . MULTIPLE TOOTH EXTRACTIONS     Family History:  Family History  Problem Relation Age of Onset  . Arthritis Father   . Hyperlipidemia Father   . High blood pressure Father   . Diabetes Sister   . Diabetes Mother   . Diabetes  Brother   . Mental illness Brother   . Alcohol abuse Paternal Uncle   . Alcohol abuse Paternal Grandfather   . Breast cancer Maternal Aunt   . Breast cancer Paternal Aunt   . High blood pressure Sister   . Mental illness Other        runs in family   Family Psychiatric  History: None available. Social History:  Social History   Substance and Sexual Activity  Alcohol Use No     Social History   Substance and Sexual Activity  Drug Use No    Social History   Socioeconomic History  . Marital status: Single    Spouse name: Not on file  . Number of children: Not on file  . Years of education: Not on file  . Highest education level: Patient refused  Occupational History  . Not on file  Tobacco Use  . Smoking status: Never Smoker  . Smokeless tobacco: Never Used  Vaping Use  . Vaping Use: Never used  Substance and Sexual Activity  . Alcohol use: No  . Drug use: No  . Sexual activity: Not Currently  Other Topics Concern  . Not on file  Social History Narrative  . Not on file   Social Determinants of Health   Financial Resource Strain: Not on file  Food Insecurity: Not on file  Transportation Needs: Not on file  Physical Activity: Not on file  Stress: Not on file  Social Connections: Not on file   Additional Social History:    Allergies:  No Known Allergies  Labs:  Results for orders placed or performed during the hospital encounter of 02/03/21 (from the past 48 hour(s))  CBC     Status: None   Collection Time: 02/14/21  3:54 AM  Result Value Ref Range   WBC 5.7 4.0 - 10.5 K/uL   RBC 4.35 3.87 - 5.11 MIL/uL   Hemoglobin 12.3 12.0 - 15.0 g/dL   HCT 38.2 36.0 - 46.0 %   MCV 87.8 80.0 - 100.0 fL   MCH 28.3 26.0 - 34.0 pg   MCHC 32.2 30.0 - 36.0 g/dL   RDW 12.4 11.5 - 15.5 %   Platelets 184 150 - 400 K/uL   nRBC 0.0 0.0 - 0.2 %    Comment: Performed at [facility], [place] [address] [place], [place] [address]  Magnesium     Status: None    Collection Time: 02/14/21  3:54 AM  Result Value Ref Range   Magnesium 1.8 1.7 - 2.4 mg/dL    Comment: Performed at [facility], [place] [address] [place], [place] [address]  Comprehensive metabolic panel     Status: Abnormal   Collection Time: 02/14/21  3:54 AM  Result Value Ref Range   Sodium 138 135 - 145 mmol/L   Potassium 3.1 (L) 3.5 - 5.1 mmol/L   Chloride 103 98 - 111 mmol/L   CO2 26 22 - 32 mmol/L   Glucose, Bld 86 70 - 99 mg/dL    Comment: Glucose reference range applies only to samples taken  after fasting for at least 8 hours.   BUN 7 6 - 20 mg/dL   Creatinine, Ser 1.05 (H) 0.44 - 1.00 mg/dL   Calcium 9.2 8.9 - 10.3 mg/dL   Total Protein 5.6 (L) 6.5 - 8.1 g/dL   Albumin 3.1 (L) 3.5 - 5.0 g/dL   AST 18 15 - 41 U/L   ALT 26 0 - 44 U/L   Alkaline Phosphatase 64 38 - 126 U/L   Total Bilirubin 0.8 0.3 - 1.2 mg/dL   GFR, Estimated >60 >60 mL/min    Comment: (NOTE) Calculated using the CKD-EPI Creatinine Equation (2021)    Anion gap 9 5 - 15    Comment: Performed at [facility], [place] [address] [place], [place] [address]    Current Facility-Administered Medications  Medication Dose Route Frequency Provider Last Rate Last Admin  . 0.9 %  sodium chloride infusion   Intravenous PRN [place] Eric J, DO 10 mL/hr at 02/14/21 0741 250 mL at 02/14/21 0741  . acetaminophen (TYLENOL) tablet 650 mg  650 mg Oral Q6H PRN [name], MD       Or  . acetaminophen (TYLENOL) suppository 650 mg  650 mg Rectal Q6H PRN [name], MD      . apixaban Arne Cleveland) tablet 5 mg  5 mg Oral BID Hosie Poisson, MD   5 mg at 02/13/21 2145  . feeding supplement (ENSURE ENLIVE / ENSURE PLUS) liquid 237 mL  237 mL Oral TID BM Hosie Poisson, MD   237 mL at 02/11/21 2000  . haloperidol (HALDOL) tablet 5 mg  5 mg Oral BID Suella Broad, FNP   5 mg at 02/13/21 2145   Or  . haloperidol lactate (HALDOL) injection 5 mg  5 mg Intramuscular BID  Suella Broad, FNP   5 mg at 02/14/21 0941  . haloperidol (HALDOL) tablet 5 mg  5 mg Oral Q8H PRN Suella Broad, FNP      . hydrOXYzine (ATARAX/VISTARIL) tablet 25 mg  25 mg Oral TID PRN Suella Broad, FNP      . LORazepam (ATIVAN) injection 1 mg  1 mg Intravenous BID Suella Broad, FNP   1 mg at 02/14/21 0941  . methimazole (TAPAZOLE) tablet 10 mg  10 mg Oral BID Hosie Poisson, MD   10 mg at 02/13/21 2145  . multivitamin with minerals tablet 1 tablet  1 tablet Oral Daily Hosie Poisson, MD   1 tablet at 02/07/21 0905  . polyethylene glycol (MIRALAX / GLYCOLAX) packet 17 g  17 g Oral Daily Hosie Poisson, MD   17 g at 02/07/21 0905  . potassium chloride 10 mEq in 100 mL IVPB  10 mEq Intravenous Q1 Hr x 6 [place] Eric J, DO 100 mL/hr at 02/14/21 0943 10 mEq at 02/14/21 0943  . propranolol (INDERAL) tablet 40 mg  40 mg Oral BID Roby Lofts M., PA-C   40 mg at 02/13/21 2145  . senna-docusate (Senokot-S) tablet 2 tablet  2 tablet Oral BID Hosie Poisson, MD   2 tablet at 02/13/21 2145  . temazepam (RESTORIL) capsule 30 mg  30 mg Oral QHS Suella Broad, FNP   30 mg at 02/13/21 2145    Musculoskeletal: Strength & Muscle Tone: within normal limits Gait & Station: Unable to assess Patient leans: N/A            Psychiatric Specialty Exam:  Presentation  General Appearance: Bizarre  Eye Contact:Fleeting  Speech:Clear and  Coherent; Slow  Speech Volume:Decreased  Handedness:Right   Mood and Affect  Mood:Depressed  Affect:Flat; Constricted   Thought Process  Thought Processes:Linear  Descriptions of Associations:Intact  Orientation:Partial  Thought Content:Logical; Paranoid Ideation; Perseveration  History of Schizophrenia/Schizoaffective disorder:Yes  Duration of Psychotic Symptoms:Greater than six months  Hallucinations:Hallucinations: -- (She answered yes to this question, but not elaborate)  Ideas of  Reference:None  Suicidal Thoughts:Suicidal Thoughts: No  Homicidal Thoughts:Homicidal Thoughts: No   Sensorium  Memory:Immediate Fair; Recent Poor; Remote Poor  Judgment:Poor  Insight:Lacking   Executive Functions  Concentration:Fair  Attention Span:Fair  Recall:Poor  Fund of Knowledge:Fair  Language:Fair   Psychomotor Activity  Psychomotor Activity:Psychomotor Activity: Decreased   Assets  Assets:Communication Skills; Desire for Improvement; Talents/Skills; Social Support   Sleep  Sleep:Sleep: Fair   Physical Exam: Physical Exam ROS Blood pressure 128/72, pulse 68, temperature 98.9 F (37.2 C), temperature source Oral, resp. rate 17, height 5\' 1"  (1.549 m), weight 60.5 kg, SpO2 99 %. Body mass index is 25.2 kg/m.  Treatment Plan Summary: Patient with some improvement in her symptoms. Continue to provide schedule medications (medication should be offered orally first, however if patient declines then administer IM.  Continue working closely with social work to facilitate disposition to include referral to act team.   -continue Haldol 5mg  PO/IM BID.Will order Haldol 5mg  po TID prn. This has been previously  communicated with nursing staff and attending [place] Patient is to receive IM Haldol, please limit use of IV Haldol as she continues to be at risk for catatonia. Patient will need oral or IM loading doses prior to considering long acting medications such as Haldol Deconate.  -Will continue Ativan 1mg  PO/IV BID for EPS, and catatonia. -Recommend referral to act team. -Patient does not meet inpatient criteria at this time. As noted was recently discharged from [facility] about 4 weeks ago with no improvement and or benefit from inpatient hospitalization. Will recommend endocrinology be seen inpatient, as patient has exhibited difficulty following up outpatient.  -Patient can continue to receive outpatient behavioral health resources at [facility]. -EKG 02/12/21-  qtc 457  Psychiatry to sign off at this time.   Disposition: No evidence of imminent risk to self or others at present.   Patient does not meet criteria for psychiatric inpatient admission.  Suella Broad, FNP 02/14/2021 11:25 AM

## 2021-02-14 NOTE — Progress Notes (Signed)
PT Cancellation Note  Patient Details Name: Syliva Mee Liscano MRN: [id number] DOB: 1966/02/15   Cancelled Treatment:    Reason Eval/Treat Not Completed: Other (comment), patient just got from [facility], back in bed. Declines to get up at this time.   Claretha Cooper 02/14/2021, 3:46 PM  [place] Pager [phone] Office [phone]

## 2021-02-14 NOTE — Progress Notes (Signed)
PROGRESS NOTE    Paula Dickson  SLH:[id number] DOB: [birth year] DOA: 02/03/2021 PCP: Sandi Mariscal, MD    Brief Narrative:  Paula Dickson is a 55 year old female with past medical history significant for bipolar disorder, schizoaffective disorder, hypothyroidism who was brought to the ED for hallucinations, not eating eating and drinking and noncompliance with her home medications.  Patient without suicidal thoughts or attempts.  Per patient's sister, she has been off of lithium for more than 1 year.  Given patient has not been eating or drinking or taking her home medications, she was involuntarily committed and brought to the ED.  Patient was recently discharged from [facility] in [place] on January 21, 2021.  In the ED, patient was oriented to name and place and following commands.  Afebrile.  Sodium 167, creatinine 3.3, bicarb 19, lactic acid 2.7, WBC 11.  Patient initially was noted to be mildly hypotensive which improved with IV fluid hydration.  Covid-19 test negative.  Hospitalist service consulted for further evaluation and management of acute renal failure and hypernatremia.   Assessment & Plan:   Principal Problem:   AKI (acute kidney injury) ([place]) Active Problems:   Schizophrenia ([place])   Bipolar disorder, most recent episode depressed ([place])   Hypernatremia   Paroxysmal atrial fibrillation ([facility])   Acute renal failure: Resolved Creatinine 3.38 at time of admission, likely secondary to prerenal azotemia from dehydration. --Cr 3.38>>>1.05 --Continue encourage increased oral intake, fluids  Hypernatremia: resolved Patient was sodium 167 on arrival, likely secondary to hypovolemic hypernatremia from severe dehydration. --Na 167>>138 --Continue to encourage increase oral intake, fluids  Hyperthyroidism TSH <0.010 and FT4 elevated to 2.38.  Thyroid ultrasound 10/2020 with multinodular goiter. --Propranolol 40 mg p.o. twice  daily --Methimazole 10 mg p.o. twice daily  Atrial fibrillation with RVR: Resolved On arrival, patient was in sinus tachycardia but repeat EKG showed A. fib with RVR with a rate of 189 bpm.  Patient was initially started on a diltiazem drip with improvement in heart rate.  Etiology likely multifactorial with poor oral intake and severe dehydration and hyperthyroidism in which she is noncompliant with her home medications.  Patient was seen by cardiology and followed during the hospital course.  TTE with LVEF 70-75%, no LV wall motion abnormalities, normal IVC. --Propranolol 40 mg p.o. twice daily --Eliquis 5 mg p.o. twice daily --Repeat EKG 3/19 shows now converted back to normal sinus rhythm, discontinued telemetry  Bipolar disorder Paranoid schizophrenia Recently discharged from [facility] psychiatric hospital on 01/21/2021.  Patient had not been taking her medications as prescribed.  Patient presented with paranoid hallucinations.  Evaluated by psychiatry.  Remains paranoid, refusing most of her medications. --Continue involuntary IVC, Expires 02/18/21 --Haldol 5 mg PO BID (5mg  IM if refuses PO) --Hydroxyzine 25 mg PO TID prn anxiety --Temazepam 30 mg PO qHS --TOC working on placement  Hypokalemia:  Hypomagnesemia: Potassium 3.1, magnesium 1.9, will replete through IV today.  DVT prophylaxis: Eliquis   Code Status: Full Code Family Communication: No family present at bedside this morning  Disposition Plan:  Level of care: Telemetry Status is: Inpatient  Remains inpatient appropriate because:Unsafe d/c plan, IV treatments appropriate due to intensity of illness or inability to take PO and Inpatient level of care appropriate due to severity of illness   Dispo: The patient is from: Home              Anticipated d/c is to: Psychiatric hospital  Patient currently is not medically stable to d/c.  Poorly controlled bipolar/schizophrenia with continued paranoia   Difficult to  place patient No    Consultants:   Cardiology, Dr. Keene Breath - signed off on 02/06/2021  Psychiatry  Procedures:   TTE  Antimicrobials:   None   Subjective: Patient seen and examined bedside, resting comfortably.  Sitter present.  Patient is very despondent, turns her head the other way when attempting to address.  Only shakes her head for answers and pulling covers over her head.  Continues to be paranoid secondary to her poorly controlled bipolar/schizophrenia, continues to refuse medications especially in the a.m.  Given her continued paranoia and her severe lack of insight to her chronic medical conditions she is unsafe for discharge to live alone with poor social support.  Patient only ate some fruit this morning.  But she has been drinking more fluids as of late per nursing/sitter reported.  No acute concerns this morning per nursing staff.   Objective: Vitals:   02/13/21 0624 02/13/21 1310 02/13/21 2020 02/14/21 0552  BP: 119/83 128/88 125/74 128/72  Pulse: 77 78 70 68  Resp: 17 18 17 17   Temp: 98.8 F (37.1 C) 98.4 F (36.9 C) 98.3 F (36.8 C) 98.9 F (37.2 C)  TempSrc: Oral Oral Oral Oral  SpO2: 95% 98% 100% 99%  Weight:      Height:        Intake/Output Summary (Last 24 hours) at 02/14/2021 0946 Last data filed at 02/14/2021 0148 Gross per 24 hour  Intake 1544.4 ml  Output --  Net 1544.4 ml   Filed Weights   02/03/21 2044 02/03/21 2322 02/07/21 0400  Weight: 55 kg 55.6 kg 60.5 kg    Examination:  General exam: Appears calm and comfortable, despondent, paranoid, refusing medications Respiratory system: Clear to auscultation. Respiratory effort normal.  Oxygenating well on room air. Cardiovascular system: S1 & S2 heard, RRR. No JVD, murmurs, rubs, gallops or clicks. No pedal edema. Gastrointestinal system: Abdomen is nondistended, soft and nontender. No organomegaly or masses felt. Normal bowel sounds heard. Central nervous system: Alert and oriented to  person/place/time; but not oriented to situation. No focal neurological deficits. Extremities: Symmetric 5 x 5 power. Skin: No rashes, lesions or ulcers Psychiatry: Judgement and insight appear extremely poor.  Paranoid, depressed    Data Reviewed: I have personally reviewed following labs and imaging studies  CBC: Recent Labs  Lab 02/08/21 0836 02/14/21 0354  WBC 5.0 5.7  NEUTROABS 2.3  --   HGB 13.1 12.3  HCT 40.2 38.2  MCV 87.4 87.8  PLT 115* 937   Basic Metabolic Panel: Recent Labs  Lab 02/08/21 0836 02/10/21 0940 02/14/21 0354  NA 147* 139 138  K 3.8 3.5 3.1*  CL 107 102 103  CO2 30 27 26   GLUCOSE 119* 103* 86  BUN 13 10 7   CREATININE 1.03* 1.01* 1.05*  CALCIUM 10.0 9.3 9.2  MG 1.8  --  1.8  PHOS 3.0  --   --    GFR: Estimated Creatinine Clearance: 49.4 mL/min (A) (by C-G formula based on SCr of 1.05 mg/dL (H)). Liver Function Tests: Recent Labs  Lab 02/14/21 0354  AST 18  ALT 26  ALKPHOS 64  BILITOT 0.8  PROT 5.6*  ALBUMIN 3.1*   No results for input(s): LIPASE, AMYLASE in the last 168 hours. No results for input(s): AMMONIA in the last 168 hours. Coagulation Profile: No results for input(s): INR, PROTIME in the last 168 hours.  Cardiac Enzymes: No results for input(s): CKTOTAL, CKMB, CKMBINDEX, TROPONINI in the last 168 hours. BNP (last 3 results) No results for input(s): PROBNP in the last 8760 hours. HbA1C: No results for input(s): HGBA1C in the last 72 hours. CBG: No results for input(s): GLUCAP in the last 168 hours. Lipid Profile: No results for input(s): CHOL, HDL, LDLCALC, TRIG, CHOLHDL, LDLDIRECT in the last 72 hours. Thyroid Function Tests: No results for input(s): TSH, T4TOTAL, FREET4, T3FREE, THYROIDAB in the last 72 hours. Anemia Panel: No results for input(s): VITAMINB12, FOLATE, FERRITIN, TIBC, IRON, RETICCTPCT in the last 72 hours. Sepsis Labs: No results for input(s): PROCALCITON, LATICACIDVEN in the last 168 hours.  No  results found for this or any previous visit (from the past 240 hour(s)).       Radiology Studies: No results found.      Scheduled Meds: . apixaban  5 mg Oral BID  . feeding supplement  237 mL Oral TID BM  . haloperidol  5 mg Oral BID   Or  . haloperidol lactate  5 mg Intramuscular BID  . LORazepam  1 mg Intravenous BID  . methimazole  10 mg Oral BID  . multivitamin with minerals  1 tablet Oral Daily  . polyethylene glycol  17 g Oral Daily  . propranolol  40 mg Oral BID  . senna-docusate  2 tablet Oral BID  . temazepam  30 mg Oral QHS   Continuous Infusions: . sodium chloride 250 mL (02/14/21 0741)  . potassium chloride 10 mEq (02/14/21 0943)     LOS: 11 days    Time spent: 35 minutes spent on chart review, discussion with nursing staff, consultants, updating family and interview/physical exam; more than 50% of that time was spent in counseling and/or coordination of care.    Lawerence Dery J [place] DO [facility] Available via Epic secure chat 7am-7pm After these hours, please refer to coverage provider listed on [email or web address] 02/14/2021, 9:46 AM

## 2021-02-14 NOTE — Care Management Important Message (Signed)
Important Message  Patient Details IM Letter given to the Patient. Name: Paula Dickson MRN: [id number] Date of Birth: 08/09/1966   Medicare Important Message Given:  Yes     Kerin Salen 02/14/2021, 2:21 PM

## 2021-02-15 NOTE — Progress Notes (Signed)
RN in to do morning assessment. Pt very tearful and crying. Pt states "I want to go to [facility] leave this place" Emotional support and reassurance given to Pt. Sitter remains at bedside

## 2021-02-15 NOTE — Progress Notes (Signed)
Pt this morning agreeable to taking oral Medications except for Senokot and Miralax.

## 2021-02-15 NOTE — Plan of Care (Signed)
  Problem: Clinical Measurements: Goal: Diagnostic test results will improve Outcome: Progressing Goal: Cardiovascular complication will be avoided Outcome: Progressing   Problem: Coping: Goal: Level of anxiety will decrease Outcome: Progressing   Problem: Safety: Goal: Ability to remain free from injury will improve Outcome: Progressing

## 2021-02-15 NOTE — Progress Notes (Signed)
PROGRESS NOTE    Kelsee Preslar Portell  ZMO:[id number] DOB: Jun 08, 1966 DOA: 02/03/2021 PCP: Sandi Mariscal, MD    Brief Narrative:  Rovena Hearld Pecha is a 55 year old female with past medical history significant for bipolar disorder, schizoaffective disorder, hypothyroidism who was brought to the ED for hallucinations, not eating eating and drinking and noncompliance with her home medications.  Patient without suicidal thoughts or attempts.  Per patient's sister, she has been off of lithium for more than 1 year.  Given patient has not been eating or drinking or taking her home medications, she was involuntarily committed and brought to the ED.  Patient was recently discharged from [facility] in [place] on January 21, 2021.  In the ED, patient was oriented to name and place and following commands.  Afebrile.  Sodium 167, creatinine 3.3, bicarb 19, lactic acid 2.7, WBC 11.  Patient initially was noted to be mildly hypotensive which improved with IV fluid hydration.  Covid-19 test negative.  Hospitalist service consulted for further evaluation and management of acute renal failure and hypernatremia.   Assessment & Plan:   Principal Problem:   AKI (acute kidney injury) ([place]) Active Problems:   Schizophrenia ([place])   Bipolar disorder, most recent episode depressed ([place])   Hypernatremia   Paroxysmal atrial fibrillation (HCC)   Acute renal failure: Resolved Creatinine 3.38 at time of admission, likely secondary to prerenal azotemia from dehydration. --Cr 3.38>>>1.05 --Continue encourage increased oral intake, fluids  Hypernatremia: resolved Patient was sodium 167 on arrival, likely secondary to hypovolemic hypernatremia from severe dehydration. --Na 167>>138 --Continue to encourage increase oral intake, fluids  Hyperthyroidism TSH <0.010 and FT4 elevated to 2.38.  Thyroid ultrasound 10/2020 with multinodular goiter. --Propranolol 40 mg p.o. twice  daily --Methimazole 10 mg p.o. twice daily  Atrial fibrillation with RVR: Resolved On arrival, patient was in sinus tachycardia but repeat EKG showed A. fib with RVR with a rate of 189 bpm.  Patient was initially started on a diltiazem drip with improvement in heart rate.  Etiology likely multifactorial with poor oral intake and severe dehydration and hyperthyroidism in which she is noncompliant with her home medications.  Patient was seen by cardiology and followed during the hospital course.  TTE with LVEF 70-75%, no LV wall motion abnormalities, normal IVC. --Propranolol 40 mg p.o. twice daily --Eliquis 5 mg p.o. twice daily --Repeat EKG 3/19 shows now converted back to normal sinus rhythm, discontinued telemetry  Bipolar disorder Paranoid schizophrenia Recently discharged from [facility] on 01/21/2021.  Patient had not been taking her medications as prescribed.  Patient presented with paranoid hallucinations.  Evaluated by psychiatry.  Remains paranoid, refusing most of her medications. --Continue involuntary IVC, Expires 02/18/21 --Haldol 5 mg PO BID (5mg  IM if refuses PO) --Hydroxyzine 25 mg PO TID prn anxiety --Temazepam 30 mg PO qHS --TOC working on placement  Hypokalemia:  Hypomagnesemia: Potassium 3.1, magnesium 1.9, will replete through IV today.  DVT prophylaxis: Eliquis   Code Status: Full Code Family Communication: No family present at bedside this morning  Disposition Plan:  Level of care: Telemetry Status is: Inpatient  Remains inpatient appropriate because:Unsafe d/c plan, IV treatments appropriate due to intensity of illness or inability to take PO and Inpatient level of care appropriate due to severity of illness   Dispo: The patient is from: Home              Anticipated d/c is to: Psychiatric hospital  Patient currently is not medically stable to d/c.  Poorly controlled bipolar/schizophrenia with continued paranoia   Difficult to  place patient No    Consultants:   Cardiology, Dr. Keene Breath - signed off on 02/06/2021  Psychiatry  Procedures:   TTE  Antimicrobials:   None   Subjective: Patient seen and examined bedside, resting comfortably.  Sitter present.  Patient is emotional and crying this morning.  States "I want to go to heaven", "that is what the man is telling me".  I asked how she is going to get there, she does not have any answers and continues with crying spells.  She also states "I have no place to go and this is to much for me".  Patient continues to lack insight into her chronic medical conditions requiring medication treatment with continued refusal of her medications especially in the morning.  Continues with poorly controlled paranoid schizophrenia/bipolar disorder.  Given her continued paranoia and her severe lack of insight to her chronic medical conditions she is unsafe for discharge to live alone with poor social support.  Patient continues to increase fluid intake but with poor solid intake. No acute concerns this morning per nursing staff.   Objective: Vitals:   02/14/21 0552 02/14/21 1455 02/15/21 0644 02/15/21 0923  BP: 128/72 (!) 149/94 125/68 122/64  Pulse: 68 69 66 62  Resp: 17 (!) 22 16   Temp: 98.9 F (37.2 C) 98.2 F (36.8 C) 98.8 F (37.1 C)   TempSrc: Oral Oral Oral   SpO2: 99% 100% 100%   Weight:      Height:        Intake/Output Summary (Last 24 hours) at 02/15/2021 1054 Last data filed at 02/15/2021 0845 Gross per 24 hour  Intake 1357.01 ml  Output --  Net 1357.01 ml   Filed Weights   02/03/21 2044 02/03/21 2322 02/07/21 0400  Weight: 55 kg 55.6 kg 60.5 kg    Examination:  General exam: Appears calm and comfortable, despondent, paranoid, refusing medications, crying Respiratory system: Clear to auscultation. Respiratory effort normal.  Oxygenating well on room air. Cardiovascular system: S1 & S2 heard, RRR. No JVD, murmurs, rubs, gallops or clicks. No  pedal edema. Gastrointestinal system: Abdomen is nondistended, soft and nontender. No organomegaly or masses felt. Normal bowel sounds heard. Central nervous system: Alert and oriented to person/place/time; but not oriented to situation. No focal neurological deficits. Extremities: Symmetric 5 x 5 power. Skin: No rashes, lesions or ulcers Psychiatry: Judgement and insight appear extremely poor.  Paranoid, depressed    Data Reviewed: I have personally reviewed following labs and imaging studies  CBC: Recent Labs  Lab 02/14/21 0354  WBC 5.7  HGB 12.3  HCT 38.2  MCV 87.8  PLT 993   Basic Metabolic Panel: Recent Labs  Lab 02/10/21 0940 02/14/21 0354  NA 139 138  K 3.5 3.1*  CL 102 103  CO2 27 26  GLUCOSE 103* 86  BUN 10 7  CREATININE 1.01* 1.05*  CALCIUM 9.3 9.2  MG  --  1.8   GFR: Estimated Creatinine Clearance: 49.4 mL/min (A) (by C-G formula based on SCr of 1.05 mg/dL (H)). Liver Function Tests: Recent Labs  Lab 02/14/21 0354  AST 18  ALT 26  ALKPHOS 64  BILITOT 0.8  PROT 5.6*  ALBUMIN 3.1*   No results for input(s): LIPASE, AMYLASE in the last 168 hours. No results for input(s): AMMONIA in the last 168 hours. Coagulation Profile: No results for input(s): INR, PROTIME in the  last 168 hours. Cardiac Enzymes: No results for input(s): CKTOTAL, CKMB, CKMBINDEX, TROPONINI in the last 168 hours. BNP (last 3 results) No results for input(s): PROBNP in the last 8760 hours. HbA1C: No results for input(s): HGBA1C in the last 72 hours. CBG: No results for input(s): GLUCAP in the last 168 hours. Lipid Profile: No results for input(s): CHOL, HDL, LDLCALC, TRIG, CHOLHDL, LDLDIRECT in the last 72 hours. Thyroid Function Tests: No results for input(s): TSH, T4TOTAL, FREET4, T3FREE, THYROIDAB in the last 72 hours. Anemia Panel: No results for input(s): VITAMINB12, FOLATE, FERRITIN, TIBC, IRON, RETICCTPCT in the last 72 hours. Sepsis Labs: No results for input(s):  PROCALCITON, LATICACIDVEN in the last 168 hours.  No results found for this or any previous visit (from the past 240 hour(s)).       Radiology Studies: No results found.      Scheduled Meds: . apixaban  5 mg Oral BID  . feeding supplement  237 mL Oral TID BM  . haloperidol  5 mg Oral BID   Or  . haloperidol lactate  5 mg Intramuscular BID  . LORazepam  1 mg Intravenous BID  . methimazole  10 mg Oral BID  . multivitamin with minerals  1 tablet Oral Daily  . polyethylene glycol  17 g Oral Daily  . propranolol  40 mg Oral BID  . senna-docusate  2 tablet Oral BID  . temazepam  30 mg Oral QHS   Continuous Infusions: . sodium chloride 250 mL (02/14/21 0741)     LOS: 12 days    Time spent: 35 minutes spent on chart review, discussion with nursing staff, consultants, updating family and interview/physical exam; more than 50% of that time was spent in counseling and/or coordination of care.    Husain Costabile J [place] DO [facility] Available via Epic secure chat 7am-7pm After these hours, please refer to coverage provider listed on [email or web address] 02/15/2021, 10:54 AM

## 2021-02-16 LAB — CBC
HCT: 41 % (ref 36.0–46.0)
Hemoglobin: 13.4 g/dL (ref 12.0–15.0)
MCH: 27.9 pg (ref 26.0–34.0)
MCHC: 32.7 g/dL (ref 30.0–36.0)
MCV: 85.2 fL (ref 80.0–100.0)
Platelets: 250 10*3/uL (ref 150–400)
RBC: 4.81 MIL/uL (ref 3.87–5.11)
RDW: 12.4 % (ref 11.5–15.5)
WBC: 5.8 10*3/uL (ref 4.0–10.5)
nRBC: 0 % (ref 0.0–0.2)

## 2021-02-16 LAB — BASIC METABOLIC PANEL
Anion gap: 9 (ref 5–15)
BUN: 8 mg/dL (ref 6–20)
CO2: 29 mmol/L (ref 22–32)
Calcium: 10.1 mg/dL (ref 8.9–10.3)
Chloride: 101 mmol/L (ref 98–111)
Creatinine, Ser: 1.13 mg/dL — ABNORMAL HIGH (ref 0.44–1.00)
GFR, Estimated: 57 mL/min — ABNORMAL LOW (ref >60)
Glucose, Bld: 90 mg/dL (ref 70–99)
Potassium: 3.9 mmol/L (ref 3.5–5.1)
Sodium: 139 mmol/L (ref 135–145)

## 2021-02-16 LAB — MAGNESIUM: Magnesium: 1.9 mg/dL (ref 1.7–2.4)

## 2021-02-16 MED ORDER — POTASSIUM CHLORIDE CRYS ER 20 MEQ PO TBCR
40.0000 meq | EXTENDED_RELEASE_TABLET | Freq: Once | ORAL | Status: AC
  Administered 2021-02-16: 40 meq via ORAL
  Filled 2021-02-16: qty 2

## 2021-02-16 NOTE — TOC Progression Note (Signed)
Transition of Care [facility]) - Progression Note    Patient Details  Name: Paula Dickson MRN: [id number] Date of Birth: 10-Aug-1966  Transition of [facility]) CM/SW Contact  Ross Ludwig, [place] Phone Number: 02/16/2021, 1:11 PM  Clinical Narrative:     CSW contacted [facility] to find out if patient is still on the wait list.  Per [facility] she is still on the wait list.   Expected Discharge Plan: Psychiatric Hospital Barriers to Discharge: No Barriers Identified  Expected Discharge Plan and Services Expected Discharge Plan: [place] arrangements for the past 2 months: Single Family Home                                       Social Determinants of Health (SDOH) Interventions    Readmission Risk Interventions No flowsheet data found.

## 2021-02-16 NOTE — Progress Notes (Signed)
PROGRESS NOTE  Paula Dickson  DOB: Dec 02, 1965  PCP: Sandi Mariscal, MD LOV:[id number]  DOA: 02/03/2021  LOS: 13 days   Chief Complaint  Patient presents with  . Failure to thrive    Brief narrative: Paula Dickson is a 55 y.o. female with PMH significant for bipolar disorder, schizoaffective disorder, hypothyroidism. Patient was brought to the ED on 02/03/2021 for hallucinations, poor oral intake and noncompliance to home medications. She was IVCed by her sister and brought to the ED. Patient was recently discharged from [facility] in [place] on January 21, 2021. In the ED, patient was oriented to name and place and following commands. Labs showed sodium level significantly elevated to 167, creatinine 3.3, bicarb 19, lactic acid 2.7, WBC 11.   Admitted to hospitalist service.  Subjective: Patient was seen and examined this morning. Pleasant middle-aged African-American female.  Not in physical distress.  Sitter at bedside.    Assessment/Plan: Acute kidney injury -Creatinine was elevated to 3.38 on admission.  Gradually improved with IV fluid. Recent Labs    12/29/20 2214 02/03/21 1757 02/03/21 2217 02/04/21 0426 02/04/21 1003 02/06/21 1022 02/07/21 0348 02/08/21 0836 02/10/21 0940 02/14/21 0354 02/16/21 0923  BUN 20 97*  --  83* 75* 28* 18 13 10 7 8   CREATININE 1.33* 3.38* 2.50* 2.30* 2.03* 1.13* 1.16* 1.03* 1.01* 1.05* 1.13*   Hypernatremia -Sodium level was elevated to 167 on admission due to severe dehydration.  Improved with IV fluid. Recent Labs  Lab 02/10/21 0940 02/14/21 0354 02/16/21 0923  NA 139 138 139   Hyperthyroidism -TSH <0.010 and FT4 elevated to 2.38.  Thyroid ultrasound 10/2020 with multinodular goiter. -Continue propranolol 40 mg p.o. twice daily and Methimazole 10 mg p.o. twice daily  Atrial fibrillation with RVR -On admission, patient was in A. fib with RVR.  Initially required Cardizem drip.   Currently in normal sinus rhythm with propanolol 40 mg twice daily.  On anticoagulation with Eliquis 5 mg twice daily.    Bipolar disorder Paranoid schizophrenia -Recently discharged from [facility] hospital on 01/21/2021.  Patient had not been taking her medications as prescribed.  Patient presented with paranoid hallucinations.  Evaluated by psychiatry.  Remains paranoid, refusing most of her medications. --Continue involuntary IVC, Expires 02/18/21 --Haldol 5 mg PO BID (5mg  IM if refuses PO) --Hydroxyzine 25 mg PO TID prn anxiety --Temazepam 30 mg PO qHS --TOC working on placement  Hypokalemia/ Hypomagnesemia Recent Labs  Lab 02/10/21 0940 02/14/21 0354 02/16/21 0923  K 3.5 3.1* 3.9  MG  --  1.8 1.9   Mobility: Encourage ambulation Code Status:   Code Status: Full Code  Nutritional status: Body mass index is 25.2 kg/m. Nutrition Problem: Inadequate oral intake Etiology: chronic illness Signs/Symptoms: per patient/family report,meal completion < 25% Diet Order            Diet regular Room service appropriate? Yes; Fluid consistency: Thin  Diet effective now                 DVT prophylaxis:  apixaban (ELIQUIS) tablet 5 mg   Antimicrobials:  None Fluid: None Consultants: Psychiatry Family Communication:  None at bedside  Status is: Inpatient Remains inpatient appropriate because: Pending inpatient psychiatric placement.  Dispo:  Patient From: Home  Planned Disposition: [facility]  Medically stable for discharge: Yes  Difficult to place patient: Yes      Infusions:  . sodium chloride 250 mL (02/14/21 0741)    Scheduled Meds: .  apixaban  5 mg Oral BID  . feeding supplement  237 mL Oral TID BM  . haloperidol  5 mg Oral BID   Or  . haloperidol lactate  5 mg Intramuscular BID  . LORazepam  1 mg Intravenous BID  . methimazole  10 mg Oral BID  . multivitamin with minerals  1 tablet Oral Daily  . polyethylene glycol  17 g Oral Daily   . propranolol  40 mg Oral BID  . senna-docusate  2 tablet Oral BID  . temazepam  30 mg Oral QHS    Antimicrobials: Anti-infectives (From admission, onward)   None      PRN meds: sodium chloride, acetaminophen **OR** acetaminophen, haloperidol, hydrOXYzine   Objective: Vitals:   02/16/21 0633 02/16/21 1300  BP: 119/74 117/75  Pulse: 74 72  Resp: 17 18  Temp: 98.3 F (36.8 C) 97.8 F (36.6 C)  SpO2: 96% 99%    Intake/Output Summary (Last 24 hours) at 02/16/2021 1312 Last data filed at 02/15/2021 2207 Gross per 24 hour  Intake 720 ml  Output --  Net 720 ml   Filed Weights   02/03/21 2044 02/03/21 2322 02/07/21 0400  Weight: 55 kg 55.6 kg 60.5 kg   Weight change:  Body mass index is 25.2 kg/m.   Physical Exam: General exam: Middle-aged African-American female.  Not in distress Skin: No rashes, lesions or ulcers. HEENT: Atraumatic, normocephalic, no obvious bleeding Lungs: Clear to auscultation bilaterally CVS: Regular rate and rhythm, no murmur GI/Abd soft, nontender, nondistended, bowel sound present CNS: Alert, awake, knows she is in the hospital, Psychiatry: Depressed look Extremities: No pedal edema, no calf tenderness  Data Review: I have personally reviewed the laboratory data and studies available.  Recent Labs  Lab 02/14/21 0354 02/16/21 0923  WBC 5.7 5.8  HGB 12.3 13.4  HCT 38.2 41.0  MCV 87.8 85.2  PLT 184 250   Recent Labs  Lab 02/10/21 0940 02/14/21 0354 02/16/21 0923  NA 139 138 139  K 3.5 3.1* 3.9  CL 102 103 101  CO2 27 26 29   GLUCOSE 103* 86 90  BUN 10 7 8   CREATININE 1.01* 1.05* 1.13*  CALCIUM 9.3 9.2 10.1  MG  --  1.8 1.9    F/u labs ordered Unresulted Labs (From admission, onward)         None      Signed, Terrilee Croak, MD [facility] 02/16/2021

## 2021-02-16 NOTE — Progress Notes (Signed)
Nutrition Follow-up  RD working remotely.  DOCUMENTATION CODES:   Not applicable  INTERVENTION:  - continue Ensure Enlive TID.  NUTRITION DIAGNOSIS:   Inadequate oral intake related to chronic illness as evidenced by per patient/family report,meal completion < 25%. -improving  GOAL:   Patient will meet greater than or equal to 90% of their needs -minimally met at this time  MONITOR:   PO intake,Supplement acceptance,Labs,Weight trends,I & O's  ASSESSMENT:   55 y.o. female with history of bipolar disorder and schizoaffective disorder who was discharged from [facility] in [place] on January 21, 2021 as per the patient's sister was brought to the ER after patient since discharge from [facility] has not been eating or drinking or taking any of her medication and also has been having some hallucinations.  No suicidal thoughts or attempts.  Per patient's sister patient has been off lithium for more than a year.  Since patient has not been eating or drinking or taking medications patient was involuntarily committed and brought to the ER.  Recently documented intakes: 3/19: 100% of lunch--330 kcal and 20 grams protein 3/20: 20% of breakfast, 0% of lunch, 0% of dinner--148 kcal and 2 grams protein 3/21: 100% of breakfast and 50% of lunch--total of 419 kcal and 12 grams protein 3/22: 100% of breakfast and 80% of lunch--total of 614 kcal and 12 grams protein  She has been accepting Ensure 50% of the time offered; each bottle provides 350 kcal and 20 grams protein.  She has not been weighed since 3/14. No information documented in the edema section of the flow sheet.   Plan is for d/c to psych hospital.     Labs reviewed; K: 3.1 mmol/l, creatinine: 1.05 mg/dl. Medications reviewed; 1 tablet multivitamin with minerals/day, 17 g miralax/day, 40 mEq Klor-Con x1 dose 3/23, 2 tablets senokot BID.   Diet Order:   Diet Order            Diet regular Room service  appropriate? Yes; Fluid consistency: Thin  Diet effective now                 EDUCATION NEEDS:   Not appropriate for education at this time  Skin:  Skin Assessment: Reviewed RN Assessment  Last BM:  3/21  Height:   Ht Readings from Last 1 Encounters:  02/03/21 5' 1"  (1.549 m)    Weight:   Wt Readings from Last 1 Encounters:  02/07/21 60.5 kg    Estimated Nutritional Needs:  Kcal:  1450-1650 Protein:  70-85g Fluid:  1.6L/day     Jarome Matin, MS, RD, LDN, CNSC Inpatient Clinical Dietitian RD pager # available in AMION  After hours/weekend pager # available in [facility]

## 2021-02-16 NOTE — Progress Notes (Signed)
Physical Therapy Discharge Patient Details Name: Roseline Ebarb Marksberry MRN: [id number] DOB: 06/09/1966 Today's Date: 02/16/2021 Time:  -     Patient discharged from PT services secondary to patient has refused 3 (three) consecutive times without medical reason.Per sitter , patient ambulates well in room to BR. Currently, patient's needs can be met by nursing. Patient waiting on psyche bed.  Please see latest therapy progress note for current level of functioning and progress toward goals.     Claretha Cooper 02/16/2021, 1:52 PM [place] Pager [phone] Office [phone]

## 2021-02-16 NOTE — Plan of Care (Signed)
  Problem: Education: Goal: Knowledge of General Education information will improve Description: Including pain rating scale, medication(s)/side effects and non-pharmacologic comfort measures Outcome: Progressing   Problem: Clinical Measurements: Goal: Will remain free from infection Outcome: Progressing Goal: Diagnostic test results will improve Outcome: Progressing Goal: Cardiovascular complication will be avoided Outcome: Progressing   Problem: Coping: Goal: Level of anxiety will decrease Outcome: Progressing

## 2021-02-16 NOTE — Progress Notes (Signed)
Patient took all meds except senokot.  Refused morning labs.  Has been agreeable otherwise.

## 2021-02-16 NOTE — Plan of Care (Signed)
  Problem: Education: Goal: Knowledge of General Education information will improve Description: Including pain rating scale, medication(s)/side effects and non-pharmacologic comfort measures Outcome: Progressing   Problem: Activity: Goal: Risk for activity intolerance will decrease Outcome: Progressing   

## 2021-02-16 NOTE — Progress Notes (Signed)
Pt agreeable to morning meds with the exception of Senekot and Miralax. Timothy Townsel, Laurel Dimmer, RN

## 2021-02-17 NOTE — Progress Notes (Signed)
Occupational Therapy Discharge Patient Details Name: [name] MRN: [id number] DOB: [birth year] Today's Date: 02/17/2021 Time:  -     Patient discharged from OT services secondary to multiple refusals with therapy attempts. Per sitter patient has been ambulating to the bathroom. Patient currently waiting on geri-psych bed.   Please see latest therapy progress note for current level of functioning and progress toward goals.    Delbert Phenix OT OT pager: [place] 02/17/2021, 6:20 AM

## 2021-02-17 NOTE — Plan of Care (Signed)
  Problem: Education: Goal: Knowledge of General Education information will improve Description: Including pain rating scale, medication(s)/side effects and non-pharmacologic comfort measures Outcome: Progressing   Problem: Health Behavior/Discharge Planning: Goal: Ability to manage health-related needs will improve Outcome: Progressing   Problem: Clinical Measurements: Goal: Ability to maintain clinical measurements within normal limits will improve Outcome: Progressing Goal: Will remain free from infection Outcome: Progressing Goal: Diagnostic test results will improve Outcome: Progressing Goal: Cardiovascular complication will be avoided Outcome: Progressing   Problem: Activity: Goal: Risk for activity intolerance will decrease Outcome: Progressing   Problem: Nutrition: Goal: Adequate nutrition will be maintained Outcome: Progressing   Problem: Coping: Goal: Level of anxiety will decrease Outcome: Progressing   Problem: Safety: Goal: Ability to remain free from injury will improve Outcome: Progressing   Problem: Skin Integrity: Goal: Risk for impaired skin integrity will decrease Outcome: Progressing

## 2021-02-17 NOTE — Progress Notes (Signed)
PROGRESS NOTE  Paula Dickson  DOB: 21-Aug-1966  PCP: Sandi Mariscal, MD VOH:[id number]  DOA: 02/03/2021  LOS: 14 days   Chief Complaint  Patient presents with  . Failure to thrive    Brief narrative: Paula Dickson is a 55 y.o. female with PMH significant for bipolar disorder, schizoaffective disorder, hypothyroidism. Patient was brought to the ED on 02/03/2021 for hallucinations, poor oral intake and noncompliance to home medications. She was IVCed by her sister and brought to the ED. Patient was recently discharged from [facility] in [place] on January 21, 2021. In the ED, patient was oriented to name and place and following commands. Labs showed sodium level significantly elevated to 167, creatinine 3.3, bicarb 19, lactic acid 2.7, WBC 11.   Admitted to hospitalist service. Seen by psychiatry services.  Currently waiting for inpatient psych bed ability  Subjective: Patient was seen and examined this morning. Pleasant middle-aged African-American female.  Not in physical distress.  Sitter at bedside.    Assessment/Plan: Acute kidney injury -Creatinine was elevated to 3.38 on admission.  Gradually improved to normal with IV fluid. Recent Labs    12/29/20 2214 02/03/21 1757 02/03/21 2217 02/04/21 0426 02/04/21 1003 02/06/21 1022 02/07/21 0348 02/08/21 0836 02/10/21 0940 02/14/21 0354 02/16/21 0923  BUN 20 97*  --  83* 75* 28* 18 13 10 7 8   CREATININE 1.33* 3.38* 2.50* 2.30* 2.03* 1.13* 1.16* 1.03* 1.01* 1.05* 1.13*   Hypernatremia -Sodium level was elevated to 167 on admission due to severe dehydration.  Improved to normal with IV fluid. Recent Labs  Lab 02/14/21 0354 02/16/21 0923  NA 138 139   Hyperthyroidism -TSH <0.010 and FT4 elevated to 2.38.  Thyroid ultrasound 10/2020 with multinodular goiter. -Continue propranolol 40 mg p.o. twice daily and Methimazole 10 mg p.o. twice daily  Atrial fibrillation with RVR -On  admission, patient was in A. fib with RVR.  Initially required Cardizem drip.  Currently in normal sinus rhythm with propanolol 40 mg twice daily.  On anticoagulation with Eliquis 5 mg twice daily.    Bipolar disorder Paranoid schizophrenia -Recently discharged from [facility] on 01/21/2021.  Patient had not been taking her medications as prescribed.  Patient presented with paranoid hallucinations.  Evaluated by psychiatry.  Remains paranoid, refusing most of her medications. --Continue involuntary IVC, Expires 02/18/21 --Haldol 5 mg PO BID (5mg  IM if refuses PO) --Hydroxyzine 25 mg PO TID prn anxiety --Temazepam 30 mg PO qHS --TOC working on behavioral health placement  Hypokalemia/ Hypomagnesemia Recent Labs  Lab 02/14/21 0354 02/16/21 0923  K 3.1* 3.9  MG 1.8 1.9   Mobility: Encourage ambulation Code Status:   Code Status: Full Code  Nutritional status: Body mass index is 25.2 kg/m. Nutrition Problem: Inadequate oral intake Etiology: chronic illness Signs/Symptoms: per patient/family report,meal completion < 25% Diet Order            Diet regular Room service appropriate? Yes; Fluid consistency: Thin  Diet effective now                 DVT prophylaxis:  apixaban (ELIQUIS) tablet 5 mg   Antimicrobials:  None Fluid: None Consultants: Psychiatry Family Communication:  None at bedside  Status is: Inpatient Remains inpatient appropriate because: Pending inpatient psychiatric placement.  Dispo:  Patient From: Home  Planned Disposition: [place]  Medically stable for discharge: Yes  Difficult to place patient: Yes      Infusions:  . sodium chloride 250  mL (02/14/21 0741)    Scheduled Meds: . apixaban  5 mg Oral BID  . feeding supplement  237 mL Oral TID BM  . haloperidol  5 mg Oral BID   Or  . haloperidol lactate  5 mg Intramuscular BID  . LORazepam  1 mg Intravenous BID  . methimazole  10 mg Oral BID  . multivitamin with  minerals  1 tablet Oral Daily  . polyethylene glycol  17 g Oral Daily  . propranolol  40 mg Oral BID  . senna-docusate  2 tablet Oral BID  . temazepam  30 mg Oral QHS    Antimicrobials: Anti-infectives (From admission, onward)   None      PRN meds: sodium chloride, acetaminophen **OR** acetaminophen, haloperidol, hydrOXYzine   Objective: Vitals:   02/16/21 2115 02/17/21 0646  BP: 130/76 125/77  Pulse: 71 60  Resp: 18 18  Temp: 98.5 F (36.9 C) 98.5 F (36.9 C)  SpO2: 99% 99%    Intake/Output Summary (Last 24 hours) at 02/17/2021 1018 Last data filed at 02/17/2021 0831 Gross per 24 hour  Intake 1800 ml  Output --  Net 1800 ml   Filed Weights   02/03/21 2044 02/03/21 2322 02/07/21 0400  Weight: 55 kg 55.6 kg 60.5 kg   Weight change:  Body mass index is 25.2 kg/m.   Physical Exam: General exam: Middle-aged African-American female.  Not in pain.  Not in distress Skin: No rashes, lesions or ulcers. HEENT: Atraumatic, normocephalic, no obvious bleeding Lungs: Clear to auscultation bilaterally CVS: Regular rate and rhythm, no murmur GI/Abd soft, nontender, nondistended, bowel sound present CNS: Alert, awake, knows she is in the hospital, Psychiatry: Depressed look Extremities: No pedal edema, no calf tenderness  Data Review: I have personally reviewed the laboratory data and studies available.  Recent Labs  Lab 02/14/21 0354 02/16/21 0923  WBC 5.7 5.8  HGB 12.3 13.4  HCT 38.2 41.0  MCV 87.8 85.2  PLT 184 250   Recent Labs  Lab 02/14/21 0354 02/16/21 0923  NA 138 139  K 3.1* 3.9  CL 103 101  CO2 26 29  GLUCOSE 86 90  BUN 7 8  CREATININE 1.05* 1.13*  CALCIUM 9.2 10.1  MG 1.8 1.9    F/u labs  Unresulted Labs (From admission, onward)         None      Signed, Terrilee Croak, MD [facility] 02/17/2021

## 2021-02-18 MED ORDER — AMANTADINE HCL 100 MG PO CAPS
100.0000 mg | ORAL_CAPSULE | Freq: Two times a day (BID) | ORAL | Status: DC
  Administered 2021-02-18 – 2021-02-20 (×4): 100 mg via ORAL
  Filled 2021-02-18 (×4): qty 1

## 2021-02-18 MED ORDER — HALOPERIDOL 2 MG PO TABS
2.0000 mg | ORAL_TABLET | Freq: Two times a day (BID) | ORAL | Status: DC
  Administered 2021-02-18 – 2021-02-20 (×4): 2 mg via ORAL
  Filled 2021-02-18 (×4): qty 1

## 2021-02-18 MED ORDER — HALOPERIDOL LACTATE 5 MG/ML IJ SOLN
2.0000 mg | Freq: Two times a day (BID) | INTRAMUSCULAR | Status: DC
  Filled 2021-02-18: qty 1

## 2021-02-18 MED ORDER — LORAZEPAM 1 MG PO TABS: 1.0000 mg | ORAL_TABLET | Freq: Two times a day (BID) | ORAL | Status: DC | PRN

## 2021-02-18 NOTE — Consult Note (Signed)
Paula Dickson is a 55 y.o. female with history of bipolar disorder and schizoaffective disorder who was discharged from [facility] psychiatric facility in [place] on January 21, 2021 as per the patient's sister was brought to the ER after patient since discharge from [facility] has not been eating or drinking or taking any of her medication and also has been having some hallucinations.  No suicidal thoughts or attempts.  Per patient's sister patient has been off lithium for more than a year.  Since patient has not been eating or drinking or taking medications patient was involuntarily committed and brought to the ER.  This nurse practitioner was contacted by her staff nurse, as patient is requesitng medication reduction. She states patient is willing to take Haldol but "it zonks her out". Will adjust Haldol 2 mg po BID at this time.   -Continue to optimize treatment for Hyperthyroidism.  -New orders placed for Haldol 2mg  po BID.  -Recommend referral to act team.

## 2021-02-18 NOTE — TOC Progression Note (Signed)
Transition of Care [facility]) - Progression Note    Patient Details  Name: Paula Dickson MRN: [id number] Date of Birth: 08-30-1966  Transition of [facility]) CM/SW Contact  Ross Ludwig, [place] Phone Number: 02/18/2021, 5:01 PM  Clinical Narrative:     CSW informed patient does not need IVC renewed per psych.  Patient plan to discharge back home when medically cleared.  Patient needs to follow up with [facility] and Act team.  CSW to continue to follow patient's progress throughout discharge planning.   Expected Discharge Plan: Psychiatric Hospital Barriers to Discharge: No Barriers Identified  Expected Discharge Plan and Services Expected Discharge Plan: [place] arrangements for the past 2 months: Single Family Home                                       Social Determinants of Health (SDOH) Interventions    Readmission Risk Interventions No flowsheet data found.

## 2021-02-18 NOTE — Plan of Care (Signed)
Pt alert, very pleasant thru the night, taken all scheduled meds and will continue plan of care.  Problem: Nutrition: Goal: Adequate nutrition will be maintained Outcome: Progressing   Problem: Safety: Goal: Ability to remain free from injury will improve Outcome: Progressing

## 2021-02-18 NOTE — Progress Notes (Signed)
PROGRESS NOTE  Paula Dickson  DOB: [birth year]  PCP: Sandi Mariscal, MD LNL:[id number]  DOA: 02/03/2021  LOS: 15 days   Chief Complaint  Patient presents with  . Failure to thrive    Brief narrative: Paula Dickson is a 55 y.o. female with PMH significant for bipolar disorder, schizoaffective disorder, hypothyroidism. Patient was brought to the ED on 02/03/2021 for hallucinations, poor oral intake and noncompliance to home medications. She was IVCed by her sister and brought to the ED. Patient was recently discharged from [facility] in [place] on January 21, 2021. In the ED, patient was oriented to name and place and following commands. Labs showed sodium level significantly elevated to 167, creatinine 3.3, bicarb 19, lactic acid 2.7, WBC 11.   Admitted to hospitalist service. Seen by psychiatry services.   See below for details  Subjective: Patient was seen and examined this morning. Pleasant middle-aged African-American female.  Not in physical distress.    Assessment/Plan: Acute kidney injury -Creatinine was elevated to 3.38 on admission.  Gradually improved to normal with IV fluid. Recent Labs    12/29/20 2214 02/03/21 1757 02/03/21 2217 02/04/21 0426 02/04/21 1003 02/06/21 1022 02/07/21 0348 02/08/21 0836 02/10/21 0940 02/14/21 0354 02/16/21 0923  BUN 20 97*  --  83* 75* 28* 18 13 10 7 8   CREATININE 1.33* 3.38* 2.50* 2.30* 2.03* 1.13* 1.16* 1.03* 1.01* 1.05* 1.13*   Hypernatremia -Sodium level was elevated to 167 on admission due to severe dehydration.  Improved to normal with IV fluid. Recent Labs  Lab 02/14/21 0354 02/16/21 0923  NA 138 139   Hyperthyroidism -TSH <0.010 and FT4 elevated to 2.38. Thyroid ultrasound 10/2020 with multinodular goiter. -Continue propranolol 40 mg p.o. twice daily and Methimazole 10 mg p.o. twice daily  Atrial fibrillation with RVR -On admission, patient was in A. fib with RVR.   Initially required Cardizem drip.  Currently in normal sinus rhythm with propanolol 40 mg twice daily.  On anticoagulation with Eliquis 5 mg twice daily.    Bipolar disorder Paranoid schizophrenia -Recently discharged from [facility] hospital on 01/21/2021.  Patient had not been taking her medications as prescribed.  Patient presented with paranoid hallucinations.  Evaluated by psychiatry.  She was noted to be paranoid, refusing most of her medications.  She was IVCed. -Psychiatry evaluation obtained today 3/25.  Per psychiatry note, patient has had improvement in her symptoms with medications. Medications are adjusted. Patient no longer meets criteria for inpatient psychiatric admission.   -Haldol 2mg  PO BID, Ativan 1 mg p.o. twice daily for catatonia and agitation. -Hydroxyzine 25 mg PO TID prn anxiety -Temazepam 30 mg PO qHS -TOC working for referral to ACT team  Hypokalemia/ Hypomagnesemia Recent Labs  Lab 02/14/21 0354 02/16/21 0923  K 3.1* 3.9  MG 1.8 1.9   Mobility: Encourage ambulation Code Status:   Code Status: Full Code  Nutritional status: Body mass index is 25.2 kg/m. Nutrition Problem: Inadequate oral intake Etiology: chronic illness Signs/Symptoms: per patient/family report,meal completion < 25% Diet Order            Diet regular Room service appropriate? Yes; Fluid consistency: Thin  Diet effective now                 DVT prophylaxis:  apixaban (ELIQUIS) tablet 5 mg   Antimicrobials:  None Fluid: None Consultants: Psychiatry Family Communication:  None at bedside  Status is: Inpatient Remains inpatient appropriate because: ACT team evaluation pending.  Dispo:  Patient From: Home  Planned Disposition: [place]  Medically stable for discharge: Yes  Difficult to place patient: Yes   Infusions:  . sodium chloride 250 mL (02/14/21 0741)    Scheduled Meds: . amantadine  100 mg Oral BID  . apixaban  5 mg Oral BID  . feeding  supplement  237 mL Oral TID BM  . haloperidol  2 mg Oral BID   Or  . haloperidol lactate  2 mg Intramuscular BID  . methimazole  10 mg Oral BID  . multivitamin with minerals  1 tablet Oral Daily  . polyethylene glycol  17 g Oral Daily  . propranolol  40 mg Oral BID  . senna-docusate  2 tablet Oral BID  . temazepam  30 mg Oral QHS    Antimicrobials: Anti-infectives (From admission, onward)   None      PRN meds: sodium chloride, acetaminophen **OR** acetaminophen, hydrOXYzine, LORazepam   Objective: Vitals:   02/18/21 0611 02/18/21 1153  BP: 117/67 (!) 142/77  Pulse: 65 82  Resp: 18 18  Temp: 98.7 F (37.1 C) 98.7 F (37.1 C)  SpO2: 99% 100%    Intake/Output Summary (Last 24 hours) at 02/18/2021 1528 Last data filed at 02/18/2021 1204 Gross per 24 hour  Intake 1080 ml  Output -  Net 1080 ml   Filed Weights   02/03/21 2044 02/03/21 2322 02/07/21 0400  Weight: 55 kg 55.6 kg 60.5 kg   Weight change:  Body mass index is 25.2 kg/m.   Physical Exam: General exam: Middle-aged African-American female.  Not in pain.  Not in distress Skin: No rashes, lesions or ulcers. HEENT: Atraumatic, normocephalic, no obvious bleeding Lungs: Clear to auscultation bilaterally CVS: Regular rate and rhythm, no murmur GI/Abd soft, nontender, nondistended, bowel sound present CNS: Alert, awake, knows she is in the hospital, Psychiatry: Depressed look Extremities: No pedal edema, no calf tenderness  Data Review: I have personally reviewed the laboratory data and studies available.  Recent Labs  Lab 02/14/21 0354 02/16/21 0923  WBC 5.7 5.8  HGB 12.3 13.4  HCT 38.2 41.0  MCV 87.8 85.2  PLT 184 250   Recent Labs  Lab 02/14/21 0354 02/16/21 0923  NA 138 139  K 3.1* 3.9  CL 103 101  CO2 26 29  GLUCOSE 86 90  BUN 7 8  CREATININE 1.05* 1.13*  CALCIUM 9.2 10.1  MG 1.8 1.9    F/u labs  Unresulted Labs (From admission, onward)          Start     Ordered   02/19/21 4287   Basic metabolic panel  Tomorrow morning,   R        02/18/21 0742   02/19/21 0500  CBC with Differential/Platelet  Tomorrow morning,   R        02/18/21 0742          Signed, Terrilee Croak, MD [facility] 02/18/2021

## 2021-02-18 NOTE — Consult Note (Addendum)
[place] Psychiatry Consult   Reason for Consult:  Medication Noncompliance Referring Physician:  Dr. Pietro Cassis Patient Identification: Paula Dickson MRN:  [id number] Principal Diagnosis: AKI (acute kidney injury) [facility]) Diagnosis:  Principal Problem:   AKI (acute kidney injury) ([place]) Active Problems:   Schizophrenia ([place])   Bipolar disorder, most recent episode depressed ([place])   Hypernatremia   Paroxysmal atrial fibrillation ([place])   Total Time spent with patient: 30 minutes  Subjective:   Paula Dickson is a 55 y.o. female patient admitted with poor oral intake, refusing to eat or drink, refusing medication, and endorsing hallucinations.  On evaluation patient is alert and oriented x4, calm and cooperative, willingness to participate in conversation.  Patient states she took all over her medications and ate all of her food. She was able to recall her lunch as tilapia, broccoli, mac and cheese. The sitter was able to confirm this. Patient also correctly identified the president, today's date, hospital, and reason for admission. Patient denies any psychosis at this time.  She denies suicidal ideation and homicidal ideation.  On evaluation today, patient has greatly improved, and brightens upon approach. She has been more compliant with her medications and foods. She is able to identify her tremors in her hands and states the Cogentin is a "bad drug". When identifying her progress and removing sitter from her bedside, she stated "but I like having someone in here to talk to. What if I want to go shower or walk around? " She is encouraged to utilize her call bell. She denies suicidal ideation and or homicidal ideation.  HPI:  Paula Dickson a 55 y.o.femalewithhistory of bipolar disorder and schizoaffective disorder who was discharged from [facility] psychiatric facility in [place] on January 21, 2021 as per the patient's sister was brought to the ER after patient  since discharge from [facility] has not been eating or drinking or taking any of her medication and also has been having some hallucinations. No suicidal thoughts or attempts. Per patient's sister patient has been off lithium for more than a year. Since patient has not been eating or drinking or taking medications patient was involuntarily committed and brought to the ER.   Past Psychiatric History: Schizophrenia, bipolar disorder.  Trial and failed multiple psychotropic medication to include Haldol, risperidone, Haldol decanoate, Cogentin, carbamazepine, clozapine, hydroxyzine, temazepam.   Risk to Self:  Denies Risk to Others:   Denies Prior Inpatient Therapy:  Recent discharge from [facility] 02/25, [facility] x[facility] in 2021 multiple ED visits for schizophrenia, combative behavior.  Prior Outpatient Therapy:  [name]  Past Medical History:  Past Medical History:  Diagnosis Date  . Bipolar affective disorder ([place])   . History of arthritis   . History of chicken pox   . History of depression   . History of genital warts   . history of heart murmur   . History of high blood pressure   . History of thyroid disease   . History of UTI   . Hypertension   . Low TSH level 07/13/2017  . Schizophrenia [facility])     Past Surgical History:  Procedure Laterality Date  . ABLATION ON ENDOMETRIOSIS    . CYST REMOVAL NECK     around 11 years ago /benign  . MULTIPLE TOOTH EXTRACTIONS     Family History:  Family History  Problem Relation Age of Onset  . Arthritis Father   . Hyperlipidemia Father   . High blood pressure Father   . Diabetes  Sister   . Diabetes Mother   . Diabetes Brother   . Mental illness Brother   . Alcohol abuse Paternal Uncle   . Alcohol abuse Paternal Grandfather   . Breast cancer Maternal Aunt   . Breast cancer Paternal Aunt   . High blood pressure Sister   . Mental illness Other        runs in family   Family Psychiatric  History: None available. Social History:  Social History    Substance and Sexual Activity  Alcohol Use No     Social History   Substance and Sexual Activity  Drug Use No    Social History   Socioeconomic History  . Marital status: Single    Spouse name: Not on file  . Number of children: Not on file  . Years of education: Not on file  . Highest education level: Patient refused  Occupational History  . Not on file  Tobacco Use  . Smoking status: Never Smoker  . Smokeless tobacco: Never Used  Vaping Use  . Vaping Use: Never used  Substance and Sexual Activity  . Alcohol use: No  . Drug use: No  . Sexual activity: Not Currently  Other Topics Concern  . Not on file  Social History Narrative  . Not on file   Social Determinants of Health   Financial Resource Strain: Not on file  Food Insecurity: Not on file  Transportation Needs: Not on file  Physical Activity: Not on file  Stress: Not on file  Social Connections: Not on file   Additional Social History:    Allergies:  No Known Allergies  Labs:  No results found for this or any previous visit (from the past 48 hour(s)).  Current Facility-Administered Medications  Medication Dose Route Frequency Provider Last Rate Last Admin  . 0.9 %  sodium chloride infusion   Intravenous PRN [place] Eric J, DO 10 mL/hr at 02/14/21 0741 250 mL at 02/14/21 0741  . acetaminophen (TYLENOL) tablet 650 mg  650 mg Oral Q6H PRN [name], MD       Or  . acetaminophen (TYLENOL) suppository 650 mg  650 mg Rectal Q6H PRN [name], MD      . apixaban Arne Cleveland) tablet 5 mg  5 mg Oral BID Hosie Poisson, MD   5 mg at 02/18/21 1156  . feeding supplement (ENSURE ENLIVE / ENSURE PLUS) liquid 237 mL  237 mL Oral TID BM Hosie Poisson, MD   237 mL at 02/18/21 1157  . haloperidol (HALDOL) tablet 2 mg  2 mg Oral BID Suella Broad, FNP       Or  . haloperidol lactate (HALDOL) injection 2 mg  2 mg Intramuscular BID Suella Broad, FNP      . hydrOXYzine  (ATARAX/VISTARIL) tablet 25 mg  25 mg Oral TID PRN Suella Broad, FNP      . LORazepam (ATIVAN) injection 1 mg  1 mg Intravenous BID Suella Broad, FNP   1 mg at 02/18/21 1156  . methimazole (TAPAZOLE) tablet 10 mg  10 mg Oral BID Hosie Poisson, MD   10 mg at 02/18/21 1156  . multivitamin with minerals tablet 1 tablet  1 tablet Oral Daily Hosie Poisson, MD   1 tablet at 02/18/21 1155  . polyethylene glycol (MIRALAX / GLYCOLAX) packet 17 g  17 g Oral Daily Hosie Poisson, MD   17 g at 02/07/21 0905  . propranolol (INDERAL) tablet 40 mg  40  mg Oral BID Roby Lofts M., PA-C   40 mg at 02/18/21 1155  . senna-docusate (Senokot-S) tablet 2 tablet  2 tablet Oral BID Hosie Poisson, MD   2 tablet at 02/17/21 2214  . temazepam (RESTORIL) capsule 30 mg  30 mg Oral QHS Suella Broad, FNP   30 mg at 02/17/21 2214    Musculoskeletal: Strength & Muscle Tone: within normal limits Gait & Station: Unable to assess Patient leans: N/A    Psychiatric Specialty Exam:  Presentation  General Appearance: Appropriate for Environment; Casual  Eye Contact:Fair  Speech:Clear and Coherent; Normal Rate  Speech Volume:Normal  Handedness:Right   Mood and Affect  Mood:Euthymic  Affect:Appropriate; Congruent   Thought Process  Thought Processes:Coherent; Linear  Descriptions of Associations:Intact  Orientation:Full (Time, Place and Person)  Thought Content:Logical  History of Schizophrenia/Schizoaffective disorder:Yes  Duration of Psychotic Symptoms:Greater than six months  Hallucinations:Hallucinations: None  Ideas of Reference:None  Suicidal Thoughts:Suicidal Thoughts: No  Homicidal Thoughts:Homicidal Thoughts: No   Sensorium  Memory:Immediate Good; Recent Fair; Remote Fair  Judgment:Fair  Insight:Fair   Executive Functions  Concentration:Fair  Attention Span:Fair  [place]   Psychomotor Activity   Psychomotor Activity:Psychomotor Activity: Extrapyramidal Side Effects (EPS); Tremor Extrapyramidal Side Effects (EPS): Parkinsonism; Tardive Dyskinesia AIMS Completed?: Yes   Assets  Assets:Communication Skills; Desire for Improvement; Financial Resources/Insurance; Physical Health; Social Support   Sleep  Sleep:Sleep: Fair   Physical Exam: Physical Exam  ROS  Blood pressure (!) 142/77, pulse 82, temperature 98.7 F (37.1 C), temperature source Oral, resp. rate 18, height _0  (1.549 m), weight 60.5 kg, SpO2 100 %. Body mass index is 25.2 kg/m.  Treatment Plan Summary: Patient with some improvement in her symptoms. Continue to provide schedule medications (medication should be offered orally first, however if patient declines then administer IM.  Continue working closely with social work to facilitate disposition to include referral to act team.   -Continue to optimize hyperthyroidism treatment. Stressed the importance of taking medications daily.  -Decrease Haldol 102m Po BID. WIll dc Haldol prn order, has not been administered in over 1 week.    -Will adjust Ativan 180mpo BID prn catatonia, and agitation.  -Recommend referral to act team. -May dc safety sitter and consider telesitter at night time initially. Suspect within the next day or so she will no longer require 24 hour supervision. She expressed concerns of being alone, permission granted to help patient adjust however we can dc 1:1 sitter after 24 hours.  -Patient does not meet inpatient criteria at this time. Recommend PT/OT consult to help with debility and deconditioning as patient has been hospitalized for almost 14 days.  -[facility]tc [facility]  Psychiatry to sign off at this time.   Disposition: No evidence of imminent risk to self or others at present.   Patient does not meet criteria for psychiatric inpatient  admission.  TaSuella BroadFNP 02/18/2021 2:59 PM

## 2021-02-19 LAB — CBC WITH DIFFERENTIAL/PLATELET
Abs Immature Granulocytes: 0.01 10*3/uL (ref 0.00–0.07)
Basophils Absolute: 0 10*3/uL (ref 0.0–0.1)
Basophils Relative: 1 %
Eosinophils Absolute: 0.4 10*3/uL (ref 0.0–0.5)
Eosinophils Relative: 6 %
HCT: 37.9 % (ref 36.0–46.0)
Hemoglobin: 12.4 g/dL (ref 12.0–15.0)
Immature Granulocytes: 0 %
Lymphocytes Relative: 48 %
Lymphs Abs: 2.8 10*3/uL (ref 0.7–4.0)
MCH: 28.2 pg (ref 26.0–34.0)
MCHC: 32.7 g/dL (ref 30.0–36.0)
MCV: 86.3 fL (ref 80.0–100.0)
Monocytes Absolute: 0.6 10*3/uL (ref 0.1–1.0)
Monocytes Relative: 9 %
Neutro Abs: 2.2 10*3/uL (ref 1.7–7.7)
Neutrophils Relative %: 36 %
Platelets: 250 10*3/uL (ref 150–400)
RBC: 4.39 MIL/uL (ref 3.87–5.11)
RDW: 12.4 % (ref 11.5–15.5)
WBC: 6 10*3/uL (ref 4.0–10.5)
nRBC: 0 % (ref 0.0–0.2)

## 2021-02-19 LAB — BASIC METABOLIC PANEL
Anion gap: 9 (ref 5–15)
BUN: 12 mg/dL (ref 6–20)
CO2: 25 mmol/L (ref 22–32)
Calcium: 9.9 mg/dL (ref 8.9–10.3)
Chloride: 105 mmol/L (ref 98–111)
Creatinine, Ser: 0.99 mg/dL (ref 0.44–1.00)
GFR, Estimated: >60 mL/min (ref >60)
Glucose, Bld: 86 mg/dL (ref 70–99)
Potassium: 3.9 mmol/L (ref 3.5–5.1)
Sodium: 139 mmol/L (ref 135–145)

## 2021-02-19 NOTE — Evaluation (Signed)
Physical Therapy Re Evaluation Patient Details Name: Paula Dickson MRN: [id number] DOB: 1966/03/31 Today's Date: 02/19/2021   History of Present Illness  Pt is 55 y.o. female presenting with poor p.o. intake and medication non-compliance admitted on 02/03/21. Patient admitted with AKI, new onset A-fib with RVR, hypernatremia likely 2/2 poor p.o. intake and mild hypotension. Pt initially with IVC, noted latest psych consult on 3/25 that IVC discontinued and pt no longer appropriate for inpt psych. PMHx significant for extensive psych Hx including bipolar affective disorder, depression, and schizophrenia with recent d/c from psych facility. PMHx also significant for HTN and thyroid disease.  Clinical Impression   Pt admitted with above diagnosis.  Pt demonstrated independent safe normal gait and transfers with therapy. Demonstrated steady balance and dynamic balance.  Pt not with any major physical deficits.  Pt does not have skilled need for PT.    Follow Up Recommendations No PT follow up;Other (comment) (No physical needs for supervision; possible need for supervision due to cognition/behavior)    Equipment Recommendations  None recommended by PT    Recommendations for Other Services       Precautions / Restrictions Precautions Precautions: Fall      Mobility  Bed Mobility Overal bed mobility: Needs Assistance Bed Mobility: Supine to Sit;Sit to Supine     Supine to sit: Independent Sit to supine: Independent        Transfers Overall transfer level: Needs assistance Equipment used: None Transfers: Sit to/from Stand Sit to Stand: Supervision            Ambulation/Gait Ambulation/Gait assistance: Supervision Gait Distance (Feet): 200 Feet Assistive device: None Gait Pattern/deviations: WFL(Within Functional Limits) Gait velocity: normal   General Gait Details: Steady normal gait pattern  Stairs Stairs:  (declined stairs but simulated with marching without  difficulty)          Wheelchair Mobility    Modified Rankin (Stroke Patients Only)       Balance Overall balance assessment: Needs assistance Sitting-balance support: No upper extremity supported;Feet supported Sitting balance-Leahy Scale: Normal     Standing balance support: No upper extremity supported Standing balance-Leahy Scale: Good               High level balance activites: Side stepping;Backward walking;Turns;Head turns High Level Balance Comments: and marching without difficulty             Pertinent Vitals/Pain Pain Assessment: No/denies pain    Home Living                        Prior Function                 Hand Dominance        Extremity/Trunk Assessment   Upper Extremity Assessment Upper Extremity Assessment: Overall WFL for tasks assessed    Lower Extremity Assessment Lower Extremity Assessment: Overall WFL for tasks assessed    Cervical / Trunk Assessment Cervical / Trunk Assessment: Normal  Communication      Cognition Arousal/Alertness: Awake/alert Behavior During Therapy: Flat affect Overall Cognitive Status: No family/caregiver present to determine baseline cognitive functioning                                 General Comments: Follows commands; demonstrates decreased initiation.      General Comments      Exercises     Assessment/Plan    PT  Assessment Patent does not need any further PT services  PT Problem List         PT Treatment Interventions      PT Goals (Current goals can be found in the Care Plan section)  Acute Rehab PT Goals Patient Stated Goal: to rest PT Goal Formulation: All assessment and education complete, DC therapy    Frequency     Barriers to discharge Decreased caregiver support      Co-evaluation               AM-PAC PT "6 Clicks" Mobility  Outcome Measure Help needed turning from your back to your side while in a flat bed without using  bedrails?: None Help needed moving from lying on your back to sitting on the side of a flat bed without using bedrails?: None Help needed moving to and from a bed to a chair (including a wheelchair)?: None Help needed standing up from a chair using your arms (e.g., wheelchair or bedside chair)?: None Help needed to walk in hospital room?: None Help needed climbing 3-5 steps with a railing? : A Little 6 Click Score: 23    End of Session   Activity Tolerance: Patient tolerated treatment well Patient left: in bed;with call bell/phone within reach;with bed alarm set Nurse Communication: Mobility status      Time: 1500-1510 PT Time Calculation (min) (ACUTE ONLY): 10 min   Charges:   PT Evaluation $PT Re-evaluation: 1 Re-eval          Abran Richard, PT Acute Rehab Services Pager [phone] [facility] Rehab [phone]    Karlton Lemon 02/19/2021, 3:28 PM

## 2021-02-19 NOTE — Progress Notes (Signed)
PROGRESS NOTE  Paula Dickson  DOB: 09-01-1966  PCP: Paula Mariscal, MD BJY:[id number]  DOA: 02/03/2021  LOS: 16 days   Chief Complaint  Patient presents with  . Failure to thrive    Brief narrative: Paula Dickson is a 55 y.o. female with PMH significant for bipolar disorder, schizoaffective disorder, hypothyroidism. Patient was brought to the ED on 02/03/2021 for hallucinations, poor oral intake and noncompliance to home medications. She was IVCed by her sister and brought to the ED. Patient was recently discharged from [facility] in [place] on January 21, 2021. In the ED, patient was oriented to name and place and following commands. Labs showed sodium level significantly elevated to 167, creatinine 3.3, bicarb 19, lactic acid 2.7, WBC 11.   Admitted to hospitalist service. Seen by psychiatry services.   See below for details  Subjective: Patient was seen and examined this morning.  IVC rescinded by psychiatry on 3/25.  Patient seems very detached this morning, not willing to talk.  States he does not have any very at home to be with.  Does not feel comfortable going home. Pleasant middle-aged African-American female.  Not in physical distress.    Assessment/Plan: Acute kidney injury -Creatinine was elevated to 3.38 on admission.  Gradually improved to normal with IV fluid. Recent Labs    02/03/21 1757 02/03/21 2217 02/04/21 0426 02/04/21 1003 02/06/21 1022 02/07/21 0348 02/08/21 0836 02/10/21 0940 02/14/21 0354 02/16/21 0923 02/19/21 0430  BUN 97*  --  83* 75* 28* 18 13 10 7 8 12   CREATININE 3.38* 2.50* 2.30* 2.03* 1.13* 1.16* 1.03* 1.01* 1.05* 1.13* 0.99   Hypernatremia -Sodium level was elevated to 167 on admission due to severe dehydration.  Improved to normal with IV fluid. Recent Labs  Lab 02/14/21 0354 02/16/21 0923 02/19/21 0430  NA 138 139 139   Hyperthyroidism -TSH <0.010 and FT4 elevated to 2.38. Thyroid  ultrasound 10/2020 with multinodular goiter. -Continue propranolol 40 mg p.o. twice daily and Methimazole 10 mg p.o. twice daily  Atrial fibrillation with RVR -On admission, patient was in A. fib with RVR.  Initially required Cardizem drip.  Currently in normal sinus rhythm with propanolol 40 mg twice daily.  On anticoagulation with Eliquis 5 mg twice daily.    Bipolar disorder Paranoid schizophrenia -Recently discharged from [facility] on 01/21/2021.  Patient had not been taking her medications as prescribed.  Patient presented with paranoid hallucinations.  Evaluated by psychiatry.  She was noted to be paranoid, refusing most of her medications.  She was IVCed. -Psychiatry evaluation obtained today 3/25.  Per psychiatry note, patient has had improvement in her symptoms with medications. Medications are adjusted. Patient no longer meets criteria for inpatient psychiatric admission.   -Haldol 2mg  PO BID, Ativan 1 mg p.o. twice daily for catatonia and agitation. -Temazepam 30 mg PO qHS -TOC working for referral to ACT team  Hypokalemia/ Hypomagnesemia Recent Labs  Lab 02/14/21 0354 02/16/21 0923 02/19/21 0430  K 3.1* 3.9 3.9  MG 1.8 1.9  --    Mobility: Encourage ambulation Code Status:   Code Status: Full Code  Nutritional status: Body mass index is 25.2 kg/m. Nutrition Problem: Inadequate oral intake Etiology: chronic illness Signs/Symptoms: per patient/family report,meal completion < 25% Diet Order            Diet regular Room service appropriate? Yes; Fluid consistency: Thin  Diet effective now  DVT prophylaxis:  apixaban (ELIQUIS) tablet 5 mg   Antimicrobials:  None Fluid: None Consultants: Psychiatry Family Communication:  None at bedside  Status is: Inpatient Remains inpatient appropriate because: Patient seems very detached today, no one at home to take care of her.  I had a conversation with patient's sister Ms. Paula  Dickson this afternoon.  Updated her about her psychiatric and medical condition at this time.  She will be able to pick her up tomorrow to discharge to home.  Dispo:  Patient From: Home  Planned Disposition: Home  medically stable for discharge: Yes  Difficult to place patient: Yes   Infusions:  . sodium chloride 250 mL (02/14/21 0741)    Scheduled Meds: . amantadine  100 mg Oral BID  . apixaban  5 mg Oral BID  . feeding supplement  237 mL Oral TID BM  . haloperidol  2 mg Oral BID   Or  . haloperidol lactate  2 mg Intramuscular BID  . methimazole  10 mg Oral BID  . multivitamin with minerals  1 tablet Oral Daily  . polyethylene glycol  17 g Oral Daily  . propranolol  40 mg Oral BID  . senna-docusate  2 tablet Oral BID  . temazepam  30 mg Oral QHS    Antimicrobials: Anti-infectives (From admission, onward)   None      PRN meds: sodium chloride, acetaminophen **OR** acetaminophen, hydrOXYzine, LORazepam   Objective: Vitals:   02/19/21 0433 02/19/21 1254  BP: 104/67 (!) 151/98  Pulse: 64 63  Resp: 16 16  Temp: 98.3 F (36.8 C) 98 F (36.7 C)  SpO2: 100% 100%    Intake/Output Summary (Last 24 hours) at 02/19/2021 1412 Last data filed at 02/19/2021 1300 Gross per 24 hour  Intake 360 ml  Output -  Net 360 ml   Filed Weights   02/03/21 2044 02/03/21 2322 02/07/21 0400  Weight: 55 kg 55.6 kg 60.5 kg   Weight change:  Body mass index is 25.2 kg/m.   Physical Exam: General exam: Middle-aged African-American female.  Not in pain.  Not in distress Skin: No rashes, lesions or ulcers. HEENT: Atraumatic, normocephalic, no obvious bleeding Lungs: Clear to auscultation bilaterally CVS: Regular rate and rhythm, no murmur GI/Abd soft, nontender, nondistended, bowel sound present CNS: Alert, awake, knows she is in the hospital, Psychiatry: Depressed look Extremities: No pedal edema, no calf tenderness  Data Review: I have personally reviewed the laboratory data  and studies available.  Recent Labs  Lab 02/14/21 0354 02/16/21 0923 02/19/21 0430  WBC 5.7 5.8 6.0  NEUTROABS  --   --  2.2  HGB 12.3 13.4 12.4  HCT 38.2 41.0 37.9  MCV 87.8 85.2 86.3  PLT 184 250 250   Recent Labs  Lab 02/14/21 0354 02/16/21 0923 02/19/21 0430  NA 138 139 139  K 3.1* 3.9 3.9  CL 103 101 105  CO2 26 29 25   GLUCOSE 86 90 86  BUN 7 8 12   CREATININE 1.05* 1.13* 0.99  CALCIUM 9.2 10.1 9.9  MG 1.8 1.9  --     F/u labs  Unresulted Labs (From admission, onward)         None      Signed, Terrilee Croak, MD [facility] 02/19/2021

## 2021-02-20 MED ORDER — TEMAZEPAM 30 MG PO CAPS: 30.0000 mg | ORAL_CAPSULE | Freq: Every day | ORAL | 0 refills | Status: AC

## 2021-02-20 MED ORDER — AMANTADINE HCL 100 MG PO CAPS: 100.0000 mg | ORAL_CAPSULE | Freq: Two times a day (BID) | ORAL | 2 refills | Status: DC

## 2021-02-20 MED ORDER — METHIMAZOLE 10 MG PO TABS: 10.0000 mg | ORAL_TABLET | Freq: Two times a day (BID) | ORAL | 2 refills | Status: DC

## 2021-02-20 MED ORDER — HALOPERIDOL 2 MG PO TABS: 2.0000 mg | ORAL_TABLET | Freq: Two times a day (BID) | ORAL | 2 refills | Status: DC

## 2021-02-20 MED ORDER — LORAZEPAM 1 MG PO TABS
1.0000 mg | ORAL_TABLET | Freq: Two times a day (BID) | ORAL | 0 refills | Status: AC | PRN
Start: 2021-02-20 — End: 2021-02-27

## 2021-02-20 MED ORDER — PROPRANOLOL HCL 40 MG PO TABS: 40.0000 mg | ORAL_TABLET | Freq: Two times a day (BID) | ORAL | 2 refills | Status: DC

## 2021-02-20 NOTE — Discharge Summary (Signed)
Physician Discharge Summary  Paula Dickson CVE:[id number] DOB: November 18, 1966 DOA: 02/03/2021  PCP: Paula Mariscal, MD  Admit date: 02/03/2021 Discharge date: 02/20/2021  Admitted From: Home Discharge disposition: Home   Code Status: Full Code  Diet Recommendation: Regular diet  Discharge Diagnosis:   Principal Problem:   AKI (acute kidney injury) ([place]) Active Problems:   Schizophrenia ([place])   Bipolar disorder, most recent episode depressed ([place])   Hypernatremia   Paroxysmal atrial fibrillation [facility])  Chief Complaint  Patient presents with  . Failure to thrive    Brief narrative: Paula Dickson is a 55 y.o. female with PMH significant for bipolar disorder, schizoaffective disorder, hypothyroidism. Patient was brought to the ED on 02/03/2021 for hallucinations, poor oral intake and noncompliance to home medications. She was IVCed by her sister and brought to the ED. Patient was recently discharged from [facility] in [place] on January 21, 2021. In the ED, patient was oriented to name and place and following commands. Labs showed sodium level significantly elevated to 167, creatinine 3.3, bicarb 19, lactic acid 2.7, WBC 11.   Admitted to hospitalist service. Seen by psychiatry services.   See below for details  Subjective: Patient was seen and examined this morning.  IVC rescinded by psychiatry on 3/25.  Patient seems detached this morning.  I mentioned to her that I talked to her sister [name] yesterday who will be coming today to pick her up.  She was little more charmed to hear that.     Hospital course: Acute kidney injury -Creatinine was elevated to 3.38 on admission.  Gradually improved to normal with hydration Recent Labs    02/03/21 1757 02/03/21 2217 02/04/21 0426 02/04/21 1003 02/06/21 1022 02/07/21 0348 02/08/21 0836 02/10/21 0940 02/14/21 0354 02/16/21 0923 02/19/21 0430  BUN 97*  --  83* 75* 28* 18 13 10 7 8  12   CREATININE 3.38* 2.50* 2.30* 2.03* 1.13* 1.16* 1.03* 1.01* 1.05* 1.13* 0.99   Hypernatremia -Sodium level was elevated to 167 on admission due to severe dehydration.  Improved to normal with IV fluid. Recent Labs  Lab 02/14/21 0354 02/16/21 0923 02/19/21 0430  NA 138 139 139   Hyperthyroidism -TSH <0.010 and FT4 elevated to 2.38. Thyroid ultrasound 10/2020 with multinodular goiter. -Continue propranolol 40 mg p.o. twice daily and Methimazole 10 mg p.o. twice daily  Atrial fibrillation with RVR -On admission, patient was in A. fib with RVR.  Initially required Cardizem drip.  Currently in normal sinus rhythm with propanolol 40 mg twice daily.  Her CHA2DS2-VASc score is low and does not qualify for chronic anticoagulation.  Bipolar disorder Paranoid schizophrenia -Recently discharged from [facility] psychiatric hospital on 01/21/2021.  Patient had not been taking her medications as prescribed.  Patient presented with paranoid hallucinations.  Evaluated by psychiatry.  She was noted to be paranoid, refusing most of her medications.  She was IVCed. -Psychiatry evaluation obtained today 3/25.  Per psychiatry note, patient has had improvement in her symptoms with medications. Medications are adjusted. Patient no longer meets criteria for inpatient psychiatric admission.   -Recommended medicines at discharge Haldol 2mg  PO BID, amantadine 100 mg twice daily, Ativan 1 mg p.o. twice daily PRN for catatonia and agitation. Temazepam 30 mg PO qHS -TOC arranged her appointment with outpatient psychiatry  Hypokalemia/ Hypomagnesemia Recent Labs  Lab 02/14/21 0354 02/16/21 0923 02/19/21 0430  K 3.1* 3.9 3.9  MG 1.8 1.9  --    Stable for discharge to  home today.   Wound care:    Discharge Exam:   Vitals:   02/19/21 0433 02/19/21 1254 02/19/21 1938 02/20/21 0435  BP: 104/67 (!) 151/98 (!) 145/83 112/68  Pulse: 64 63 62 68  Resp: 16 16 15 16   Temp: 98.3 F (36.8 C) 98 F (36.7  C) 97.9 F (36.6 C) 98.6 F (37 C)  TempSrc: Oral Oral Oral Oral  SpO2: 100% 100% 99% 100%  Weight:      Height:        Body mass index is 25.2 kg/m.  General exam: Pleasant middle-aged African-American female.  Not in physical distress Skin: No rashes, lesions or ulcers. HEENT: Atraumatic, normocephalic, no obvious bleeding Lungs: Clear to auscultation bilaterally CVS: Regular rate and rhythm, no murmur GI/Abd soft, nontender, nondistended, bowel sound present CNS: Alert, awake, oriented to place and person. Psychiatry: Depressed and detached Extremities: No pedal edema, no calf tenderness  Follow ups:   Discharge Instructions    Diet general   Complete by: As directed    Increase activity slowly   Complete by: As directed       Follow-up Information    Paula Mariscal, MD Follow up.   Specialty: Internal Medicine Contact information: [place] [phone]               Recommendations for Outpatient Follow-Up:   1. Follow-up with PCP as an outpatient 2. Follow-up with psychiatry as an outpatient  Discharge Instructions:  Follow with Primary MD Paula Mariscal, MD in 7 days   Get CBC/BMP checked in next visit within 1 week by PCP or SNF MD ( we routinely change or add medications that can affect your baseline labs and fluid status, therefore we recommend that you get the mentioned basic workup next visit with your PCP, your PCP may decide not to get them or add new tests based on their clinical decision)  On your next visit with your PCP, please Get Medicines reviewed and adjusted.  Please request your PCP  to go over all Hospital Tests and Procedure/Radiological results at the follow up, please get all Hospital records sent to your Prim MD by signing hospital release before you go home.  Activity: As tolerated with Full fall precautions use walker/cane & assistance as needed  For Heart failure patients - Check your Weight same time  everyday, if you gain over 2 pounds, or you develop in leg swelling, experience more shortness of breath or chest pain, call your Primary MD immediately. Follow Cardiac Low Salt Diet and 1.5 lit/day fluid restriction.  If you have smoked or chewed Tobacco in the last 2 yrs please stop smoking, stop any regular Alcohol  and or any Recreational drug use.  If you experience worsening of your admission symptoms, develop shortness of breath, life threatening emergency, suicidal or homicidal thoughts you must seek medical attention immediately by calling 911 or calling your MD immediately  if symptoms less severe.  You Must read complete instructions/literature along with all the possible adverse reactions/side effects for all the Medicines you take and that have been prescribed to you. Take any new Medicines after you have completely understood and accpet all the possible adverse reactions/side effects.   Do not drive, operate heavy machinery, perform activities at heights, swimming or participation in water activities or provide baby sitting services if your were admitted for syncope or siezures until you have seen by Primary MD or a Neurologist and advised to do so again.  Do not drive when taking Pain medications.  Do not take more than prescribed Pain, Sleep and Anxiety Medications  Wear Seat belts while driving.   Please note You were cared for by a hospitalist during your hospital stay. If you have any questions about your discharge medications or the care you received while you were in the hospital after you are discharged, you can call the unit and asked to speak with the hospitalist on call if the hospitalist that took care of you is not available. Once you are discharged, your primary care physician will handle any further medical issues. Please note that NO REFILLS for any discharge medications will be authorized once you are discharged, as it is imperative that you return to your primary care  physician (or establish a relationship with a primary care physician if you do not have one) for your aftercare needs so that they can reassess your need for medications and monitor your lab values.    Allergies as of 02/20/2021   No Known Allergies     Medication List    STOP taking these medications   amLODipine 10 MG tablet Commonly known as: NORVASC   benztropine 1 MG tablet Commonly known as: COGENTIN   carbamazepine 100 MG chewable tablet Commonly known as: TEGRETOL   carvedilol 12.5 MG tablet Commonly known as: COREG   cloZAPine 100 MG tablet Commonly known as: CLOZARIL   cloZAPine 25 MG tablet Commonly known as: CLOZARIL   haloperidol decanoate 100 MG/ML injection Commonly known as: HALDOL DECANOATE   hydrOXYzine 25 MG tablet Commonly known as: ATARAX/VISTARIL   risperidone 4 MG tablet Commonly known as: RISPERDAL   traZODone 50 MG tablet Commonly known as: DESYREL     TAKE these medications   amantadine 100 MG capsule Commonly known as: SYMMETREL Take 1 capsule (100 mg total) by mouth 2 (two) times daily.   haloperidol 2 MG tablet Commonly known as: HALDOL Take 1 tablet (2 mg total) by mouth 2 (two) times daily. What changed:   medication strength  how much to take  when to take this   LORazepam 1 MG tablet Commonly known as: ATIVAN Take 1 tablet (1 mg total) by mouth 2 (two) times daily as needed for up to 7 days for anxiety.   methimazole 10 MG tablet Commonly known as: TAPAZOLE Take 1 tablet (10 mg total) by mouth 2 (two) times daily.   propranolol 40 MG tablet Commonly known as: INDERAL Take 1 tablet (40 mg total) by mouth 2 (two) times daily.   temazepam 30 MG capsule Commonly known as: RESTORIL Take 1 capsule (30 mg total) by mouth at bedtime for 7 days.       Time coordinating discharge: 35 minutes  The results of significant diagnostics from this hospitalization (including imaging, microbiology, ancillary and laboratory)  are listed below for reference.    Procedures and Diagnostic Studies:   DG Chest Port 1 View  Result Date: 02/03/2021 CLINICAL DATA:  Possible sepsis and failure to thrive EXAM: PORTABLE CHEST 1 VIEW COMPARISON:  11/11/2019 FINDINGS: Cardiac shadow is within normal limits. The lungs are clear bilaterally. No bony abnormality is seen. IMPRESSION: No active disease. Electronically Signed   By: Inez Catalina M.D.   On: 02/03/2021 18:48     Labs:   Basic Metabolic Panel: Recent Labs  Lab 02/14/21 0354 02/16/21 0923 02/19/21 0430  NA 138 139 139  K 3.1* 3.9 3.9  CL 103 101 105  CO2 26 29 25  GLUCOSE 86 90 86  BUN 7 8 12   CREATININE 1.05* 1.13* 0.99  CALCIUM 9.2 10.1 9.9  MG 1.8 1.9  --    GFR Estimated Creatinine Clearance: 52.4 mL/min (by C-G formula based on SCr of 0.99 mg/dL). Liver Function Tests: Recent Labs  Lab 02/14/21 0354  AST 18  ALT 26  ALKPHOS 64  BILITOT 0.8  PROT 5.6*  ALBUMIN 3.1*   No results for input(s): LIPASE, AMYLASE in the last 168 hours. No results for input(s): AMMONIA in the last 168 hours. Coagulation profile No results for input(s): INR, PROTIME in the last 168 hours.  CBC: Recent Labs  Lab 02/14/21 0354 02/16/21 0923 02/19/21 0430  WBC 5.7 5.8 6.0  NEUTROABS  --   --  2.2  HGB 12.3 13.4 12.4  HCT 38.2 41.0 37.9  MCV 87.8 85.2 86.3  PLT 184 250 250   Cardiac Enzymes: No results for input(s): CKTOTAL, CKMB, CKMBINDEX, TROPONINI in the last 168 hours. BNP: Invalid input(s): POCBNP CBG: No results for input(s): GLUCAP in the last 168 hours. D-Dimer No results for input(s): DDIMER in the last 72 hours. Hgb A1c No results for input(s): HGBA1C in the last 72 hours. Lipid Profile No results for input(s): CHOL, HDL, LDLCALC, TRIG, CHOLHDL, LDLDIRECT in the last 72 hours. Thyroid function studies No results for input(s): TSH, T4TOTAL, T3FREE, THYROIDAB in the last 72 hours.  Invalid input(s): FREET3 Anemia work up No results for  input(s): VITAMINB12, FOLATE, FERRITIN, TIBC, IRON, RETICCTPCT in the last 72 hours. Microbiology No results found for this or any previous visit (from the past 240 hour(s)).   Signed: Marlowe Aschoff [place]  [facility] 02/20/2021, 11:00 AM

## 2021-02-20 NOTE — Progress Notes (Signed)
Noted pt appears some what frightened, when asked if she was ok? Pt states " I am afraid but would not elaborate. Pt was informed that tele monitor was keeping a close eye on her as well as a tech or RN would be directly out side her door. Post med pass patient pulled her covers over her head. Pt instructed to notify us if any needs were to arise. Q1 HR visual check initiated. On going assessment

## 2021-02-20 NOTE — Plan of Care (Signed)
Discharge instructions were given to Pt and her Sister. All questions were answered.

## 2021-03-18 ENCOUNTER — Emergency Department (HOSPITAL_COMMUNITY): Payer: 59

## 2021-03-18 ENCOUNTER — Inpatient Hospital Stay (HOSPITAL_COMMUNITY): Payer: 59

## 2021-03-18 ENCOUNTER — Inpatient Hospital Stay (HOSPITAL_COMMUNITY)
Admission: EM | Admit: 2021-03-18 | Discharge: 2021-03-30 | DRG: 917 | Disposition: A | Discharge status: Other Acute Inpatient Hospital | Payer: 59 | Attending: Internal Medicine | Admitting: Internal Medicine

## 2021-03-18 ENCOUNTER — Other Ambulatory Visit: Payer: Self-pay

## 2021-03-18 DIAGNOSIS — J9690 Respiratory failure, unspecified, unspecified whether with hypoxia or hypercapnia: Secondary | ICD-10-CM

## 2021-03-18 DIAGNOSIS — T50901A Poisoning by unspecified drugs, medicaments and biological substances, accidental (unintentional), initial encounter: Secondary | ICD-10-CM | POA: Diagnosis present

## 2021-03-18 DIAGNOSIS — T50902A Poisoning by unspecified drugs, medicaments and biological substances, intentional self-harm, initial encounter: Secondary | ICD-10-CM

## 2021-03-18 DIAGNOSIS — G9340 Encephalopathy, unspecified: Secondary | ICD-10-CM

## 2021-03-18 DIAGNOSIS — E440 Moderate protein-calorie malnutrition: Secondary | ICD-10-CM | POA: Insufficient documentation

## 2021-03-18 DIAGNOSIS — Z01818 Encounter for other preprocedural examination: Secondary | ICD-10-CM

## 2021-03-18 DIAGNOSIS — J9601 Acute respiratory failure with hypoxia: Secondary | ICD-10-CM

## 2021-03-18 DIAGNOSIS — F209 Schizophrenia, unspecified: Principal | ICD-10-CM

## 2021-03-18 DIAGNOSIS — G9349 Other encephalopathy: Secondary | ICD-10-CM | POA: Diagnosis present

## 2021-03-18 DIAGNOSIS — Z9119 Patient's noncompliance with other medical treatment and regimen: Secondary | ICD-10-CM

## 2021-03-18 DIAGNOSIS — T424X2A Poisoning by benzodiazepines, intentional self-harm, initial encounter: Secondary | ICD-10-CM | POA: Diagnosis present

## 2021-03-18 DIAGNOSIS — Z9114 Patient's other noncompliance with medication regimen: Secondary | ICD-10-CM | POA: Diagnosis not present

## 2021-03-18 DIAGNOSIS — J9602 Acute respiratory failure with hypercapnia: Secondary | ICD-10-CM | POA: Diagnosis present

## 2021-03-18 DIAGNOSIS — E876 Hypokalemia: Secondary | ICD-10-CM | POA: Diagnosis present

## 2021-03-18 DIAGNOSIS — R061 Stridor: Secondary | ICD-10-CM | POA: Diagnosis present

## 2021-03-18 DIAGNOSIS — Z79899 Other long term (current) drug therapy: Secondary | ICD-10-CM

## 2021-03-18 DIAGNOSIS — T421X2A Poisoning by iminostilbenes, intentional self-harm, initial encounter: Secondary | ICD-10-CM | POA: Diagnosis present

## 2021-03-18 DIAGNOSIS — F200 Paranoid schizophrenia: Secondary | ICD-10-CM | POA: Diagnosis present

## 2021-03-18 DIAGNOSIS — E039 Hypothyroidism, unspecified: Secondary | ICD-10-CM | POA: Diagnosis present

## 2021-03-18 DIAGNOSIS — I10 Essential (primary) hypertension: Secondary | ICD-10-CM | POA: Diagnosis present

## 2021-03-18 DIAGNOSIS — Z9151 Personal history of suicidal behavior: Secondary | ICD-10-CM | POA: Diagnosis not present

## 2021-03-18 DIAGNOSIS — I480 Paroxysmal atrial fibrillation: Secondary | ICD-10-CM | POA: Diagnosis present

## 2021-03-18 DIAGNOSIS — R1310 Dysphagia, unspecified: Secondary | ICD-10-CM | POA: Diagnosis present

## 2021-03-18 DIAGNOSIS — Z6823 Body mass index (BMI) 23.0-23.9, adult: Secondary | ICD-10-CM | POA: Diagnosis not present

## 2021-03-18 DIAGNOSIS — J988 Other specified respiratory disorders: Secondary | ICD-10-CM | POA: Diagnosis not present

## 2021-03-18 DIAGNOSIS — R4182 Altered mental status, unspecified: Secondary | ICD-10-CM | POA: Diagnosis present

## 2021-03-18 DIAGNOSIS — Z20822 Contact with and (suspected) exposure to covid-19: Secondary | ICD-10-CM | POA: Diagnosis present

## 2021-03-18 DIAGNOSIS — E0590 Thyrotoxicosis, unspecified without thyrotoxic crisis or storm: Secondary | ICD-10-CM | POA: Diagnosis present

## 2021-03-18 HISTORY — DX: Essential (primary) hypertension: I10

## 2021-03-18 HISTORY — DX: Schizophrenia, unspecified: F20.9

## 2021-03-18 HISTORY — DX: Bipolar disorder, unspecified: F31.9

## 2021-03-18 LAB — CBC
HCT: 36.9 % (ref 36.0–46.0)
Hemoglobin: 11.7 g/dL — ABNORMAL LOW (ref 12.0–15.0)
MCH: 28.3 pg (ref 26.0–34.0)
MCHC: 31.7 g/dL (ref 30.0–36.0)
MCV: 89.3 fL (ref 80.0–100.0)
Platelets: 217 10*3/uL (ref 150–400)
RBC: 4.13 MIL/uL (ref 3.87–5.11)
RDW: 13.5 % (ref 11.5–15.5)
WBC: 10 10*3/uL (ref 4.0–10.5)
nRBC: 0 % (ref 0.0–0.2)

## 2021-03-18 LAB — I-STAT ARTERIAL BLOOD GAS, ED
Acid-base deficit: 2 mmol/L (ref 0.0–2.0)
Bicarbonate: 24.3 mmol/L (ref 20.0–28.0)
Calcium, Ion: 1.24 mmol/L (ref 1.15–1.40)
HCT: 35 % — ABNORMAL LOW (ref 36.0–46.0)
Hemoglobin: 11.9 g/dL — ABNORMAL LOW (ref 12.0–15.0)
O2 Saturation: 100 %
Patient temperature: 98.6
Potassium: 2.5 mmol/L — CL (ref 3.5–5.1)
Sodium: 145 mmol/L (ref 135–145)
TCO2: 26 mmol/L (ref 22–32)
pCO2 arterial: 47.7 mmHg (ref 32.0–48.0)
pH, Arterial: 7.314 — ABNORMAL LOW (ref 7.350–7.450)
pO2, Arterial: 282 mmHg — ABNORMAL HIGH (ref 83.0–108.0)

## 2021-03-18 LAB — I-STAT CHEM 8, ED
BUN: 6 mg/dL (ref 6–20)
Calcium, Ion: 1.22 mmol/L (ref 1.15–1.40)
Chloride: 99 mmol/L (ref 98–111)
Creatinine, Ser: 1.3 mg/dL — ABNORMAL HIGH (ref 0.44–1.00)
Glucose, Bld: 141 mg/dL — ABNORMAL HIGH (ref 70–99)
HCT: 34 % — ABNORMAL LOW (ref 36.0–46.0)
Hemoglobin: 11.6 g/dL — ABNORMAL LOW (ref 12.0–15.0)
Potassium: 3 mmol/L — ABNORMAL LOW (ref 3.5–5.1)
Sodium: 140 mmol/L (ref 135–145)
TCO2: 26 mmol/L (ref 22–32)

## 2021-03-18 LAB — RAPID URINE DRUG SCREEN, HOSP PERFORMED
Amphetamines: NOT DETECTED
Barbiturates: NOT DETECTED
Benzodiazepines: POSITIVE — AB
Cocaine: NOT DETECTED
Opiates: NOT DETECTED
Tetrahydrocannabinol: NOT DETECTED

## 2021-03-18 LAB — URINALYSIS, ROUTINE W REFLEX MICROSCOPIC
Bilirubin Urine: NEGATIVE
Glucose, UA: NEGATIVE mg/dL
Hgb urine dipstick: NEGATIVE
Ketones, ur: 5 mg/dL — AB
Leukocytes,Ua: NEGATIVE
Nitrite: NEGATIVE
Protein, ur: NEGATIVE mg/dL
Specific Gravity, Urine: 1.003 — ABNORMAL LOW (ref 1.005–1.030)
pH: 7 (ref 5.0–8.0)

## 2021-03-18 LAB — COMPREHENSIVE METABOLIC PANEL
ALT: 14 U/L (ref 0–44)
AST: 15 U/L (ref 15–41)
Albumin: 3.5 g/dL (ref 3.5–5.0)
Alkaline Phosphatase: 62 U/L (ref 38–126)
Anion gap: 12 (ref 5–15)
BUN: 6 mg/dL (ref 6–20)
CO2: 24 mmol/L (ref 22–32)
Calcium: 9.4 mg/dL (ref 8.9–10.3)
Chloride: 103 mmol/L (ref 98–111)
Creatinine, Ser: 1.41 mg/dL — ABNORMAL HIGH (ref 0.44–1.00)
GFR, Estimated: 44 mL/min — ABNORMAL LOW (ref >60)
Glucose, Bld: 139 mg/dL — ABNORMAL HIGH (ref 70–99)
Potassium: 3.1 mmol/L — ABNORMAL LOW (ref 3.5–5.1)
Sodium: 139 mmol/L (ref 135–145)
Total Bilirubin: 1.2 mg/dL (ref 0.3–1.2)
Total Protein: 6 g/dL — ABNORMAL LOW (ref 6.5–8.1)

## 2021-03-18 LAB — MRSA PCR SCREENING: MRSA by PCR: NEGATIVE

## 2021-03-18 LAB — GLUCOSE, CAPILLARY
Glucose-Capillary: 154 mg/dL — ABNORMAL HIGH (ref 70–99)
Glucose-Capillary: 146 mg/dL — ABNORMAL HIGH (ref 70–99)

## 2021-03-18 LAB — SALICYLATE LEVEL: Salicylate Lvl: <7 mg/dL — ABNORMAL LOW (ref 7.0–30.0)

## 2021-03-18 LAB — PROTIME-INR
INR: 1 (ref 0.8–1.2)
Prothrombin Time: 13.2 seconds (ref 11.4–15.2)

## 2021-03-18 LAB — ACETAMINOPHEN LEVEL: Acetaminophen (Tylenol), Serum: <10 ug/mL — ABNORMAL LOW (ref 10–30)

## 2021-03-18 LAB — TSH: TSH: <0.01 u[IU]/mL — ABNORMAL LOW (ref 0.350–4.500)

## 2021-03-18 LAB — CARBAMAZEPINE LEVEL, TOTAL: Carbamazepine Lvl: 7.5 ug/mL (ref 4.0–12.0)

## 2021-03-18 LAB — MAGNESIUM: Magnesium: 1.7 mg/dL (ref 1.7–2.4)

## 2021-03-18 MED ORDER — MIDAZOLAM HCL 2 MG/2ML IJ SOLN
2.0000 mg | INTRAMUSCULAR | Status: DC | PRN
  Administered 2021-03-19 – 2021-03-22 (×8): 2 mg via INTRAVENOUS
  Filled 2021-03-18 (×9): qty 2

## 2021-03-18 MED ORDER — CHLORHEXIDINE GLUCONATE 0.12% ORAL RINSE (MEDLINE KIT)
15.0000 mL | Freq: Two times a day (BID) | OROMUCOSAL | Status: DC
  Administered 2021-03-18 – 2021-03-22 (×8): 15 mL via OROMUCOSAL

## 2021-03-18 MED ORDER — PROPOFOL 1000 MG/100ML IV EMUL
0.0000 ug/kg/min | INTRAVENOUS | Status: DC
  Administered 2021-03-18 – 2021-03-19 (×2): 5 ug/kg/min via INTRAVENOUS
  Administered 2021-03-21: 15 ug/kg/min via INTRAVENOUS
  Filled 2021-03-18 (×4): qty 100

## 2021-03-18 MED ORDER — INSULIN ASPART 100 UNIT/ML ~~LOC~~ SOLN
0.0000 [IU] | SUBCUTANEOUS | Status: DC
  Administered 2021-03-18: 3 [IU] via SUBCUTANEOUS
  Administered 2021-03-18 – 2021-03-20 (×6): 2 [IU] via SUBCUTANEOUS
  Administered 2021-03-20: 3 [IU] via SUBCUTANEOUS
  Administered 2021-03-21: 2 [IU] via SUBCUTANEOUS
  Administered 2021-03-21: 3 [IU] via SUBCUTANEOUS
  Administered 2021-03-21 – 2021-03-22 (×7): 2 [IU] via SUBCUTANEOUS
  Administered 2021-03-23: 3 [IU] via SUBCUTANEOUS
  Administered 2021-03-23 – 2021-03-24 (×5): 2 [IU] via SUBCUTANEOUS
  Administered 2021-03-25 (×2): 3 [IU] via SUBCUTANEOUS
  Administered 2021-03-25: 2 [IU] via SUBCUTANEOUS
  Administered 2021-03-26: 5 [IU] via SUBCUTANEOUS
  Administered 2021-03-26: 3 [IU] via SUBCUTANEOUS
  Administered 2021-03-26: 2 [IU] via SUBCUTANEOUS
  Administered 2021-03-26 – 2021-03-27 (×3): 3 [IU] via SUBCUTANEOUS
  Administered 2021-03-27: 2 [IU] via SUBCUTANEOUS
  Administered 2021-03-27 – 2021-03-28 (×4): 3 [IU] via SUBCUTANEOUS
  Administered 2021-03-28: 5 [IU] via SUBCUTANEOUS
  Administered 2021-03-28: 0 [IU] via SUBCUTANEOUS

## 2021-03-18 MED ORDER — LACTATED RINGERS IV SOLN: INTRAVENOUS | Status: DC

## 2021-03-18 MED ORDER — LACTATED RINGERS IV BOLUS
1000.0000 mL | Freq: Once | INTRAVENOUS | Status: AC
  Administered 2021-03-18: 1000 mL via INTRAVENOUS

## 2021-03-18 MED ORDER — DOCUSATE SODIUM 50 MG/5ML PO LIQD: 100.0000 mg | Freq: Every day | ORAL | Status: DC | PRN

## 2021-03-18 MED ORDER — FENTANYL CITRATE (PF) 100 MCG/2ML IJ SOLN: 50.0000 ug | INTRAMUSCULAR | Status: DC | PRN

## 2021-03-18 MED ORDER — ROCURONIUM BROMIDE 50 MG/5ML IV SOLN
INTRAVENOUS | Status: AC | PRN
  Administered 2021-03-18: 100 mg via INTRAVENOUS

## 2021-03-18 MED ORDER — PANTOPRAZOLE SODIUM 40 MG IV SOLR
40.0000 mg | Freq: Every day | INTRAVENOUS | Status: DC
  Administered 2021-03-18: 40 mg via INTRAVENOUS
  Filled 2021-03-18: qty 40

## 2021-03-18 MED ORDER — FENTANYL CITRATE (PF) 100 MCG/2ML IJ SOLN
50.0000 ug | INTRAMUSCULAR | Status: DC | PRN
Start: 2021-03-18 — End: 2021-03-18

## 2021-03-18 MED ORDER — FENTANYL CITRATE (PF) 100 MCG/2ML IJ SOLN
50.0000 ug | INTRAMUSCULAR | Status: DC | PRN
  Administered 2021-03-20 – 2021-03-22 (×13): 100 ug via INTRAVENOUS
  Filled 2021-03-18: qty 2
  Filled 2021-03-18: qty 4
  Filled 2021-03-18 (×13): qty 2

## 2021-03-18 MED ORDER — ETOMIDATE 2 MG/ML IV SOLN
INTRAVENOUS | Status: AC | PRN
  Administered 2021-03-18: 20 mg via INTRAVENOUS

## 2021-03-18 MED ORDER — ORAL CARE MOUTH RINSE
15.0000 mL | OROMUCOSAL | Status: DC
  Administered 2021-03-18 – 2021-03-22 (×36): 15 mL via OROMUCOSAL

## 2021-03-18 NOTE — Progress Notes (Signed)
Pt transported to 4n with no complications.

## 2021-03-18 NOTE — ED Triage Notes (Signed)
Pt found by family face down, gurgling, and unresponsive. Pinpoint pupils. Given 2 mg narcan without improvement. Last seen normal last night. Pt arrives being assisted with ventilations via BVM.

## 2021-03-18 NOTE — ED Provider Notes (Signed)
[facility] EMERGENCY DEPARTMENT Provider Note   CSN: [id number] Arrival date & time:        History No chief complaint on file.   Paula Dickson is a 55 y.o. female.  HPI Patient brought in by EMS for AMS. On arrival, patient GCS 3 being bagged by EMS.  They attempted Narcan.  Patient was found with multiple pill bottles near her.  All medicines brought in including Tegretol 60-day supply approximately 20 pills left, lorazepam 7 pills missing 1 mg each, Klonopin empty bottle from December 20 5 mg, amlodipine 30-day supply of 10 mg all missing, haloperidol approximately 30 pills missing (2 mg (.  Unknown medical history no family on scene.  Attempted to call family member in chart and after being greeted, phone call was hung up on.  Attempted to call back and phone call was rejected. Unknown medical history for this patient.  Patient not responding at this time. No past medical history on file.  Patient Active Problem List   Diagnosis Date Noted  . Overdose 03/18/2021    OB History   No obstetric history on file.     No family history on file.     Home Medications Prior to Admission medications   Not on File    Allergies    Patient has no known allergies.  Review of Systems   Review of Systems  Unable to perform ROS: Patient unresponsive    Physical Exam Updated Vital Signs BP 121/73   Pulse (!) 124   Temp 98.5 F (36.9 C) (Oral)   Resp (!) 21   Ht _0  (1.6 m)   Wt 60 kg   SpO2 100%   BMI 23.43 kg/m   Physical Exam Vitals and nursing note reviewed.  Constitutional:      Appearance: She is well-developed. She is ill-appearing.  HENT:     Head: Normocephalic and atraumatic.  Eyes:     Conjunctiva/sclera: Conjunctivae normal.  Cardiovascular:     Rate and Rhythm: Normal rate and regular rhythm.     Heart sounds: No murmur heard.   Pulmonary:     Effort: Respiratory distress present.     Breath sounds: Wheezing present.   Abdominal:     General: There is no distension.     Palpations: Abdomen is soft.     Tenderness: There is no abdominal tenderness. There is no right CVA tenderness or left CVA tenderness.  Musculoskeletal:        General: No swelling or tenderness. Normal range of motion.     Cervical back: Neck supple.  Skin:    General: Skin is warm and dry.  Neurological:     Mental Status: She is disoriented and unresponsive.     GCS: GCS eye subscore is 1. GCS verbal subscore is 1. GCS motor subscore is 1.     ED Results / Procedures / Treatments   Labs (all labs ordered are listed, but only abnormal results are displayed) Labs Reviewed  COMPREHENSIVE METABOLIC PANEL - Abnormal; Notable for the following components:      Result Value   Potassium 3.1 (*)    Glucose, Bld 139 (*)    Creatinine, Ser 1.41 (*)    Total Protein 6.0 (*)    GFR, Estimated 44 (*)    All other components within normal limits  CBC - Abnormal; Notable for the following components:   Hemoglobin 11.7 (*)    All other components within normal limits  RAPID URINE DRUG SCREEN, HOSP PERFORMED - Abnormal; Notable for the following components:   Benzodiazepines POSITIVE (*)    All other components within normal limits  URINALYSIS, ROUTINE W REFLEX MICROSCOPIC - Abnormal; Notable for the following components:   Color, Urine STRAW (*)    Specific Gravity, Urine 1.003 (*)    Ketones, ur 5 (*)    All other components within normal limits  ACETAMINOPHEN LEVEL - Abnormal; Notable for the following components:   Acetaminophen (Tylenol), Serum <10 (*)    All other components within normal limits  SALICYLATE LEVEL - Abnormal; Notable for the following components:   Salicylate Lvl <8.8 (*)    All other components within normal limits  TSH - Abnormal; Notable for the following components:   TSH <0.010 (*)    All other components within normal limits  GLUCOSE, CAPILLARY - Abnormal; Notable for the following components:    Glucose-Capillary 154 (*)    All other components within normal limits  GLUCOSE, CAPILLARY - Abnormal; Notable for the following components:   Glucose-Capillary 146 (*)    All other components within normal limits  I-STAT CHEM 8, ED - Abnormal; Notable for the following components:   Potassium 3.0 (*)    Creatinine, Ser 1.30 (*)    Glucose, Bld 141 (*)    Hemoglobin 11.6 (*)    HCT 34.0 (*)    All other components within normal limits  I-STAT ARTERIAL BLOOD GAS, ED - Abnormal; Notable for the following components:   pH, Arterial 7.314 (*)    pO2, Arterial 282 (*)    Potassium 2.5 (*)    HCT 35.0 (*)    Hemoglobin 11.9 (*)    All other components within normal limits  MRSA PCR SCREENING  SARS CORONAVIRUS 2 (TAT 6-24 HRS)  PROTIME-INR  MAGNESIUM  CARBAMAZEPINE LEVEL, TOTAL  TRIGLYCERIDES  BLOOD GAS, ARTERIAL  COMPREHENSIVE METABOLIC PANEL  CBC  BLOOD GAS, ARTERIAL  MAGNESIUM  PHOSPHORUS  I-STAT BETA HCG BLOOD, ED ([facility], [facility], [facility] ONLY)  I-STAT VENOUS BLOOD GAS, ED  CBG MONITORING, ED    EKG None  Radiology CT Head Wo Contrast  Result Date: 03/18/2021 CLINICAL DATA:  Transient ischemic attack (TIA) Found unresponsive. EXAM: CT HEAD WITHOUT CONTRAST TECHNIQUE: Contiguous axial images were obtained from the base of the skull through the vertex without intravenous contrast. COMPARISON:  Head CT 04/30/2020 FINDINGS: Brain: Mild motion artifact limitations. No intracranial hemorrhage, mass effect, or midline shift. No hydrocephalus. The basilar cisterns are patent. No sulcal effacement. No definite gray-white differentiation loss. No evidence of territorial infarct or acute ischemia. No extra-axial or intracranial fluid collection. Vascular: No hyperdense vessel. Skull: No fracture or focal lesion. Sinuses/Orbits: Mucosal thickening of scattered ethmoid air cells, chronic and mildly exacerbated by intubation. Mastoid air cells are clear. No acute orbital findings. Other: Endotracheal  tube in place. IMPRESSION: No acute intracranial abnormality. Electronically Signed   By: Keith Rake M.D.   On: 03/18/2021 20:12   DG Chest Portable 1 View  Result Date: 03/18/2021 CLINICAL DATA:  Intubation.  Unresponsive. EXAM: PORTABLE CHEST 1 VIEW COMPARISON:  Radiograph 02/03/2021 FINDINGS: Endotracheal tube tip approximately 2.2 cm from the carina. Enteric tube tip and side-port below the diaphragm in the stomach. Lung volumes are low. Patchy retrocardiac and left lung base opacity, possible associated pleural effusion. No pulmonary edema. No pneumothorax. No acute osseous abnormalities are seen. IMPRESSION: 1. Endotracheal tube tip approximately 2.2 cm from the carina. 2. Enteric tube tip and side-port  below the diaphragm in the stomach. 3. Patchy retrocardiac and left lung base opacity may represent pneumonia, atelectasis, or aspiration in the appropriate clinical setting. Possible left pleural effusion. Electronically Signed   By: Keith Rake M.D.   On: 03/18/2021 18:03    Procedures Procedure Name: Intubation Date/Time: 03/18/2021 11:39 PM Performed by: Tretha Sciara, MD Pre-anesthesia Checklist: Patient identified, Emergency Drugs available, Suction available, Timeout performed and Patient being monitored Oxygen Delivery Method: Ambu bag Preoxygenation: Pre-oxygenation with 100% oxygen Induction Type: Rapid sequence Ventilation: Mask ventilation without difficulty Laryngoscope Size: Mac and 3 Grade View: Grade I Nasal Tubes: Right Tube size: 7.5 mm Number of attempts: 1 Airway Equipment and Method: Stylet Placement Confirmation: ETT inserted through vocal cords under direct vision Secured at: 22 cm Tube secured with: ETT holder        Medications Ordered in ED Medications  lactated ringers infusion ( Intravenous Infusion Verify 03/18/21 2200)  docusate (COLACE) 50 MG/5ML liquid 100 mg (has no administration in time range)  fentaNYL (SUBLIMAZE) injection  50-200 mcg (has no administration in time range)  propofol (DIPRIVAN) 1000 MG/100ML infusion (5 mcg/kg/min  60 kg Intravenous Infusion Verify 03/18/21 2200)  midazolam (VERSED) injection 2 mg (has no administration in time range)  chlorhexidine gluconate (MEDLINE KIT) (PERIDEX) 0.12 % solution 15 mL (15 mLs Mouth Rinse Given 03/18/21 2045)  MEDLINE mouth rinse (15 mLs Mouth Rinse Given 03/18/21 2208)  pantoprazole (PROTONIX) injection 40 mg (40 mg Intravenous Given 03/18/21 1916)  insulin aspart (novoLOG) injection 0-15 Units (3 Units Subcutaneous Given 03/18/21 2108)  etomidate (AMIDATE) injection (20 mg Intravenous Given 03/18/21 1724)  rocuronium (ZEMURON) injection (100 mg Intravenous Given 03/18/21 1724)  lactated ringers bolus 1,000 mL (1,000 mLs Intravenous New Bag/Given 03/18/21 1824)    ED Course  I have reviewed the triage vital signs and the nursing notes.  Pertinent labs & imaging results that were available during my care of the patient were reviewed by me and considered in my medical decision making (see chart for details).    MDM Rules/Calculators/A&P                          Patient is a 55 year old female who presented with a chief complaint of altered mental status.  Patient was unresponsive on arrival of EMS with multiple pill bottles around her.  Described the above bottles but mostly benzodiazepines appear to be missing.  They attempted Narcan and transferred no appropriate spines.  On arrival here, patient GCS 3 not responding to pain or verbal stimuli.  Given lack of airway protection, patient was intubated as above. Screening laboratory evaluation including Chem-8 and EKG and metabolic and hematologic panels without etiology of patient symptoms.  Favor missing benzodiazepines as likely etiology of patient's symptoms at this time. Patient stable on her ventilator at this time and admitted to intensivist for continued care, evaluation and management.  CT head pending at time of  handoff.  Final Clinical Impression(s) / ED Diagnoses Final diagnoses:  Respiratory failure [facility])    Rx / DC Orders ED Discharge Orders    None       Tretha Sciara, MD 03/18/21 2340    Quintella Reichert, MD 03/19/21 1216

## 2021-03-18 NOTE — H&P (Signed)
NAME:  Paula Dickson, MRN:  [id number], DOB:  1966-07-03, LOS: 0 ADMISSION DATE:  03/18/2021, CONSULTATION DATE:  03/18/21 REFERRING MD:  Dr. Ralene Bathe, ER, CHIEF COMPLAINT:  Overdose   History of Present Illness:  55 yo female found at home by family member unresponsive on the floor.  EMS called and patient found with gurgling respirations and surrounded by pill bottles.  Required intubation for airway protection.  Family did not come to ER with patient.  Last known normal time was in evening of 03/17/21.  Had recent admission for hypernatremia and AKI.  Has hx of medical non-compliance.     Hx from chart and medical team in ER.  Pertinent  Medical History  Hypothyroidism, PAF, Bipolar, Paranoid schizophrenia, HTN  Significant Hospital Events: Including procedures, antibiotic start and stop dates in addition to other pertinent events   . Admit  Interim History / Subjective:    Objective   Blood pressure (!) 143/75, pulse (!) 121, resp. rate 16, weight 60 kg, SpO2 100 %.    Vent Mode: PRVC FiO2 (%):  [100 %] 100 % Set Rate:  [16 bmp] 16 bmp Vt Set:  [500 mL] 500 mL PEEP:  [5 cmH20] 5 cmH20 Plateau Pressure:  [16 cmH20] 16 cmH20  No intake or output data in the 24 hours ending 03/18/21 1907 Filed Weights   03/18/21 1830  Weight: 60 kg    Examination:  General - on ventilator in ER room Eyes - pupils pinpoint ENT - ETT in place Cardiac - regular, tachycardic, no murmur Chest - scattered rhonchi Abdomen - soft, non tender, decreased bowel sounds Extremities - no cyanosis, clubbing, or edema Skin - no rashes Neuro - just received paralytic agent for intubation in ER   Labs/imaging that I havepersonally reviewed  (right click and "Reselect all SmartList Selections" daily)   CMP Latest Ref Rng & Units 03/18/2021 03/18/2021 03/18/2021  Glucose 70 - 99 mg/dL - 141(H) 139(H)  BUN 6 - 20 mg/dL - 6 6  Creatinine 0.44 - 1.00 mg/dL - 1.30(H) 1.41(H)  Sodium 135 - 145 mmol/L 145 140  139  Potassium 3.5 - 5.1 mmol/L 2.5(LL) 3.0(L) 3.1(L)  Chloride 98 - 111 mmol/L - 99 103  CO2 22 - 32 mmol/L - - 24  Calcium 8.9 - 10.3 mg/dL - - 9.4  Total Protein 6.5 - 8.1 g/dL - - 6.0(L)  Total Bilirubin 0.3 - 1.2 mg/dL - - 1.2  Alkaline Phos 38 - 126 U/L - - 62  AST 15 - 41 U/L - - 15  ALT 0 - 44 U/L - - 14    CBC Latest Ref Rng & Units 03/18/2021 03/18/2021 03/18/2021  WBC 4.0 - 10.5 K/uL - - 10.0  Hemoglobin 12.0 - 15.0 g/dL 11.9(L) 11.6(L) 11.7(L)  Hematocrit 36.0 - 46.0 % 35.0(L) 34.0(L) 36.9  Platelets 150 - 400 K/uL - - 217    ABG    Component Value Date/Time   PHART 7.314 (L) 03/18/2021 1853   PCO2ART 47.7 03/18/2021 1853   PO2ART 282 (H) 03/18/2021 1853   HCO3 24.3 03/18/2021 1853   TCO2 26 03/18/2021 1853   ACIDBASEDEF 2.0 03/18/2021 1853   O2SAT 100.0 03/18/2021 1853    CBG (last 3)  No results for input(s): GLUCAP in the last 72 hours.   Resolved Hospital Problem list     Assessment & Plan:   Intentional overdose with hx of Bipolar and paranoid schizophrenia. - f/u CT head - RASS goal 0 to -  1 while on ventilator  - f/u UDS, tegretol level - will need psych assessment once she is medically stable - hold outpt ativan, haldol, amantadine, restoril  Compromised airway in setting of overdose. - full vent support - f/u CXR, ABG  Hx of hyperthyroidism. - hold outpt propranolol and methimazole for now - check TSH  Best practice (right click and "Reselect all SmartList Selections" daily)  Diet:  NPO Pain/Anxiety/Delirium protocol (if indicated): Yes (RASS goal -1) VAP protocol (if indicated): Yes DVT prophylaxis: SCD GI prophylaxis: PPI Glucose control:  SSI Yes Central venous access:  N/A Arterial line:  N/A Foley:  N/A Mobility:  bed rest  PT consulted: N/A Last date of multidisciplinary goals of care discussion [Pending] Code Status:  full code Disposition: ICU  Labs   CBC: Recent Labs  Lab 03/18/21 1725 03/18/21 1744 03/18/21 1853   WBC 10.0  --   --   HGB 11.7* 11.6* 11.9*  HCT 36.9 34.0* 35.0*  MCV 89.3  --   --   PLT 217  --   --     Basic Metabolic Panel: Recent Labs  Lab 03/18/21 1725 03/18/21 1744 03/18/21 1853  NA 139 140 145  K 3.1* 3.0* 2.5*  CL 103 99  --   CO2 24  --   --   GLUCOSE 139* 141*  --   BUN 6 6  --   CREATININE 1.41* 1.30*  --   CALCIUM 9.4  --   --    GFR: CrCl cannot be calculated (Unknown ideal weight.). Recent Labs  Lab 03/18/21 1725  WBC 10.0    Liver Function Tests: Recent Labs  Lab 03/18/21 1725  AST 15  ALT 14  ALKPHOS 62  BILITOT 1.2  PROT 6.0*  ALBUMIN 3.5   No results for input(s): LIPASE, AMYLASE in the last 168 hours. No results for input(s): AMMONIA in the last 168 hours.  ABG    Component Value Date/Time   PHART 7.314 (L) 03/18/2021 1853   PCO2ART 47.7 03/18/2021 1853   PO2ART 282 (H) 03/18/2021 1853   HCO3 24.3 03/18/2021 1853   TCO2 26 03/18/2021 1853   ACIDBASEDEF 2.0 03/18/2021 1853   O2SAT 100.0 03/18/2021 1853     Coagulation Profile: Recent Labs  Lab 03/18/21 1725  INR 1.0    Cardiac Enzymes: No results for input(s): CKTOTAL, CKMB, CKMBINDEX, TROPONINI in the last 168 hours.  HbA1C: No results found for: HGBA1C  CBG: No results for input(s): GLUCAP in the last 168 hours.  Review of Systems:   Unable to obtain  Surgical History:  Endometriosis ablation, Tooth extraction, Cyst removal on neck  Social History:  No hx smoking  Family History:  Diabetes, Hypertension  Allergies No Known Allergies   Home Medications  Prior to Admission medications   Not on File     Critical care time: 38 minutes  Chesley Mires, MD [place] Pager - [phone] 03/18/2021, 7:07 PM

## 2021-03-18 NOTE — Progress Notes (Signed)
Patient received to unit, belongings at bedside are as follows: 2 socks 2 tennis shoes 1 watch Medications from home brought with patient at bedside were counted with second RN, Abby are as follows: Lorazepam 1mg  x10 Carbamazepine 100mg  x0 (empty) Anantadine 100mg  x147 Haloperidol 2mg  x33 Amlodipine Besylate 10mg  x71 Clozapine 25mg  x0 (empty) Benztropine 1mg  x33  Pharmacy notified and instructed this RN to keep medications at bedside and return home with family when possible. RN will continue to monitor.

## 2021-03-19 ENCOUNTER — Inpatient Hospital Stay (HOSPITAL_COMMUNITY): Payer: 59

## 2021-03-19 DIAGNOSIS — J988 Other specified respiratory disorders: Secondary | ICD-10-CM | POA: Diagnosis not present

## 2021-03-19 DIAGNOSIS — T50902A Poisoning by unspecified drugs, medicaments and biological substances, intentional self-harm, initial encounter: Secondary | ICD-10-CM | POA: Diagnosis not present

## 2021-03-19 LAB — CBC
HCT: 38.7 % (ref 36.0–46.0)
Hemoglobin: 12.6 g/dL (ref 12.0–15.0)
MCH: 28.1 pg (ref 26.0–34.0)
MCHC: 32.6 g/dL (ref 30.0–36.0)
MCV: 86.4 fL (ref 80.0–100.0)
Platelets: 242 10*3/uL (ref 150–400)
RBC: 4.48 MIL/uL (ref 3.87–5.11)
RDW: 13.6 % (ref 11.5–15.5)
WBC: 10.3 10*3/uL (ref 4.0–10.5)
nRBC: 0 % (ref 0.0–0.2)

## 2021-03-19 LAB — COMPREHENSIVE METABOLIC PANEL
ALT: 13 U/L (ref 0–44)
AST: 16 U/L (ref 15–41)
Albumin: 3.2 g/dL — ABNORMAL LOW (ref 3.5–5.0)
Alkaline Phosphatase: 62 U/L (ref 38–126)
Anion gap: 10 (ref 5–15)
BUN: 9 mg/dL (ref 6–20)
CO2: 27 mmol/L (ref 22–32)
Calcium: 9.5 mg/dL (ref 8.9–10.3)
Chloride: 105 mmol/L (ref 98–111)
Creatinine, Ser: 1.1 mg/dL — ABNORMAL HIGH (ref 0.44–1.00)
GFR, Estimated: 59 mL/min — ABNORMAL LOW (ref >60)
Glucose, Bld: 122 mg/dL — ABNORMAL HIGH (ref 70–99)
Potassium: 3.3 mmol/L — ABNORMAL LOW (ref 3.5–5.1)
Sodium: 142 mmol/L (ref 135–145)
Total Bilirubin: 1.4 mg/dL — ABNORMAL HIGH (ref 0.3–1.2)
Total Protein: 5.7 g/dL — ABNORMAL LOW (ref 6.5–8.1)

## 2021-03-19 LAB — POCT I-STAT 7, (LYTES, BLD GAS, ICA,H+H)
Acid-Base Excess: 2 mmol/L (ref 0.0–2.0)
Bicarbonate: 27 mmol/L (ref 20.0–28.0)
Calcium, Ion: 1.33 mmol/L (ref 1.15–1.40)
HCT: 35 % — ABNORMAL LOW (ref 36.0–46.0)
Hemoglobin: 11.9 g/dL — ABNORMAL LOW (ref 12.0–15.0)
O2 Saturation: 100 %
Patient temperature: 97.5
Potassium: 3.2 mmol/L — ABNORMAL LOW (ref 3.5–5.1)
Sodium: 144 mmol/L (ref 135–145)
TCO2: 28 mmol/L (ref 22–32)
pCO2 arterial: 39.9 mmHg (ref 32.0–48.0)
pH, Arterial: 7.435 (ref 7.350–7.450)
pO2, Arterial: 209 mmHg — ABNORMAL HIGH (ref 83.0–108.0)

## 2021-03-19 LAB — PHOSPHORUS
Phosphorus: 3.3 mg/dL (ref 2.5–4.6)
Phosphorus: 1.1 mg/dL — ABNORMAL LOW (ref 2.5–4.6)

## 2021-03-19 LAB — TRIGLYCERIDES: Triglycerides: 64 mg/dL (ref <150)

## 2021-03-19 LAB — GLUCOSE, CAPILLARY
Glucose-Capillary: 104 mg/dL — ABNORMAL HIGH (ref 70–99)
Glucose-Capillary: 116 mg/dL — ABNORMAL HIGH (ref 70–99)
Glucose-Capillary: 122 mg/dL — ABNORMAL HIGH (ref 70–99)
Glucose-Capillary: 126 mg/dL — ABNORMAL HIGH (ref 70–99)
Glucose-Capillary: 130 mg/dL — ABNORMAL HIGH (ref 70–99)
Glucose-Capillary: 132 mg/dL — ABNORMAL HIGH (ref 70–99)

## 2021-03-19 LAB — MAGNESIUM
Magnesium: 1.6 mg/dL — ABNORMAL LOW (ref 1.7–2.4)
Magnesium: 3.5 mg/dL — ABNORMAL HIGH (ref 1.7–2.4)

## 2021-03-19 LAB — CARBAMAZEPINE LEVEL, TOTAL
Carbamazepine Lvl: 14.8 ug/mL — ABNORMAL HIGH (ref 4.0–12.0)
Carbamazepine Lvl: 16.9 ug/mL (ref 4.0–12.0)

## 2021-03-19 LAB — SARS CORONAVIRUS 2 (TAT 6-24 HRS): SARS Coronavirus 2: NEGATIVE

## 2021-03-19 MED ORDER — MAGNESIUM SULFATE 4 GM/100ML IV SOLN
4.0000 g | Freq: Once | INTRAVENOUS | Status: AC
  Administered 2021-03-19: 4 g via INTRAVENOUS
  Filled 2021-03-19 (×3): qty 100

## 2021-03-19 MED ORDER — PANTOPRAZOLE SODIUM 40 MG PO PACK
40.0000 mg | PACK | ORAL | Status: DC
  Administered 2021-03-19 – 2021-03-23 (×5): 40 mg
  Filled 2021-03-19 (×5): qty 20

## 2021-03-19 MED ORDER — CHLORHEXIDINE GLUCONATE CLOTH 2 % EX PADS
6.0000 | MEDICATED_PAD | Freq: Every day | CUTANEOUS | Status: DC
  Administered 2021-03-20 – 2021-03-27 (×9): 6 via TOPICAL

## 2021-03-19 MED ORDER — POTASSIUM CHLORIDE 20 MEQ PO PACK
20.0000 meq | PACK | ORAL | Status: AC
Start: 2021-03-19 — End: 2021-03-19
  Administered 2021-03-19 (×2): 20 meq
  Filled 2021-03-19 (×2): qty 1

## 2021-03-19 MED ORDER — ENOXAPARIN SODIUM 40 MG/0.4ML ~~LOC~~ SOLN
40.0000 mg | SUBCUTANEOUS | Status: DC
  Administered 2021-03-19 – 2021-03-28 (×10): 40 mg via SUBCUTANEOUS
  Filled 2021-03-19 (×11): qty 0.4

## 2021-03-19 MED ORDER — PROPRANOLOL HCL 20 MG/5ML PO SOLN
20.0000 mg | Freq: Two times a day (BID) | ORAL | Status: DC
  Administered 2021-03-19 – 2021-03-24 (×11): 20 mg
  Filled 2021-03-19 (×12): qty 5

## 2021-03-19 MED ORDER — PROSOURCE TF PO LIQD: 45.0000 mL | Freq: Two times a day (BID) | ORAL | Status: DC

## 2021-03-19 MED ORDER — POTASSIUM CHLORIDE 10 MEQ/100ML IV SOLN
10.0000 meq | INTRAVENOUS | Status: AC
Start: 2021-03-19 — End: 2021-03-19
  Administered 2021-03-19 (×4): 10 meq via INTRAVENOUS
  Filled 2021-03-19 (×2): qty 100

## 2021-03-19 MED ORDER — VITAL AF 1.2 CAL PO LIQD
1000.0000 mL | ORAL | Status: DC
  Administered 2021-03-19 – 2021-03-24 (×6): 1000 mL

## 2021-03-19 MED ORDER — CHLORHEXIDINE GLUCONATE CLOTH 2 % EX PADS: 6.0000 | MEDICATED_PAD | Freq: Every day | CUTANEOUS | Status: DC

## 2021-03-19 MED ORDER — VITAL HIGH PROTEIN PO LIQD
1000.0000 mL | ORAL | Status: AC
Start: 2021-03-19 — End: 2021-03-19
  Administered 2021-03-19: 1000 mL

## 2021-03-19 MED ORDER — METHIMAZOLE 10 MG PO TABS
10.0000 mg | ORAL_TABLET | Freq: Two times a day (BID) | ORAL | Status: DC
  Administered 2021-03-19 – 2021-03-23 (×10): 10 mg
  Filled 2021-03-19 (×13): qty 1

## 2021-03-19 NOTE — Progress Notes (Signed)
Initial Nutrition Assessment  RD working remotely.  DOCUMENTATION CODES:   Not applicable  INTERVENTION:   Tube feeding via OG tube: - Vital AF 1.2 @ 50 ml/hr (1200 ml/day)  Tube feeding regimen provides 1440 kcal, 90 grams of protein, and 973 ml of H2O.  NUTRITION DIAGNOSIS:   Inadequate oral intake related to inability to eat as evidenced by NPO status.  GOAL:   Patient will meet greater than or equal to 90% of their needs  MONITOR:   Vent status,Labs,Weight trends,TF tolerance  REASON FOR ASSESSMENT:   Ventilator,Consult Enteral/tube feeding initiation and management  ASSESSMENT:   55 year old female who presented to the ED on 4/22 after being found unresponsive by family surrounded by multiple pill bottles. PMH of hypothyroidism, PAF, bipolar disorder, schizophrenia, HTN. Pt intubated in the ED and admitted with intentional overdose.   Per notes, plan is for full vent support until neuro status improves. RD consulted for tube feeding intiation and management. OG tube in stomach per x-ray on 4/22.  No weight history available in chart.  Patient is currently intubated on ventilator support MV: 8.3 L/min Temp (24hrs), Avg:98.5 F (36.9 C), Min:97.5 F (36.4 C), Max:99.5 F (37.5 C)  Drips: Propofol: 3.6 ml/hr (provides 95 kcal daily from lipid) LR: 30 ml/hr  Medications reviewed and include: SSI q 4 hours, protonix, klor-con 20 mEq x 2, IV magnesium sulfate 4 grams once, IV potassium chloride 10 mEq x 4 runs  Labs reviewed: potassium 3.3, creatinine 1.10, magnesium 1.6 CBG's: 104-154  UOP: 1250 ml x 12 hours I/O's: -261 ml since admit  NUTRITION - FOCUSED PHYSICAL EXAM:  Unable to complete at this time. RD working remotely.  Diet Order:   Diet Order            Diet NPO time specified  Diet effective now                 EDUCATION NEEDS:   No education needs have been identified at this time  Skin:  Skin Assessment: Reviewed RN  Assessment  Last BM:  no documented BM  Height:   Ht Readings from Last 1 Encounters:  03/18/21 5\' 3"  (1.6 m)    Weight:   Wt Readings from Last 1 Encounters:  03/18/21 60 kg    BMI:  Body mass index is 23.43 kg/m.  Estimated Nutritional Needs:   Kcal:  1420  Protein:  80-100 grams  Fluid:  1.5-1.7 L    Gustavus Bryant, MS, RD, LDN Inpatient Clinical Dietitian Please see AMiON for contact information.

## 2021-03-19 NOTE — Progress Notes (Signed)
NAME:  Paula Dickson, MRN:  [id number], DOB:  1966/06/23, LOS: 1 ADMISSION DATE:  03/18/2021, CONSULTATION DATE:  03/18/21 REFERRING MD:  Dr. Ralene Bathe, ER, CHIEF COMPLAINT:  Overdose   History of Present Illness:  55 yo female found at home by family member unresponsive on the floor.  EMS called and patient found with gurgling respirations and surrounded by pill bottles.  Required intubation for airway protection.  Family did not come to ER with patient.  Last known normal time was in evening of 03/17/21.  Had recent admission for hypernatremia and AKI.  Has hx of medical non-compliance.     Pertinent  Medical History  Hypothyroidism, PAF, Bipolar, Paranoid schizophrenia, HTN  Significant Hospital Events: Including procedures, antibiotic start and stop dates in addition to other pertinent events   . Admit; CT head >> no acute findings  Interim History / Subjective:  Remains on sedation, vent.  Objective   Blood pressure (!) 142/82, pulse (!) 110, temperature (!) 97.5 F (36.4 C), temperature source Oral, resp. rate 20, height 5\' 3"  (1.6 m), weight 60 kg, SpO2 100 %.    Vent Mode: PRVC FiO2 (%):  [40 %-100 %] 40 % Set Rate:  [16 bmp-20 bmp] 20 bmp Vt Set:  [420 mL-500 mL] 420 mL PEEP:  [5 cmH20] 5 cmH20 Plateau Pressure:  [13 cmH20-16 cmH20] 13 cmH20   Intake/Output Summary (Last 24 hours) at 03/19/2021 0751 Last data filed at 03/19/2021 0600 Gross per 24 hour  Intake 928.7 ml  Output 1250 ml  Net -321.3 ml   Filed Weights   03/18/21 1830  Weight: 60 kg    Examination:  General - sedated Eyes - pupils reactive ENT - ETT in place Cardiac - regular, tachycardic Chest - equal breath sounds b/l, no wheezing or rales Abdomen - soft, non tender, + bowel sounds Extremities - no cyanosis, clubbing, or edema Skin - no rashes Neuro - moves extremities  Labs/imaging that I havepersonally reviewed  (right click and "Reselect all SmartList Selections" daily)   CMP Latest Ref Rng &  Units 03/19/2021 03/19/2021 03/18/2021  Glucose 70 - 99 mg/dL 122(H) - -  BUN 6 - 20 mg/dL 9 - -  Creatinine 0.44 - 1.00 mg/dL 1.10(H) - -  Sodium 135 - 145 mmol/L 142 144 145  Potassium 3.5 - 5.1 mmol/L 3.3(L) 3.2(L) 2.5(LL)  Chloride 98 - 111 mmol/L 105 - -  CO2 22 - 32 mmol/L 27 - -  Calcium 8.9 - 10.3 mg/dL 9.5 - -  Total Protein 6.5 - 8.1 g/dL 5.7(L) - -  Total Bilirubin 0.3 - 1.2 mg/dL 1.4(H) - -  Alkaline Phos 38 - 126 U/L 62 - -  AST 15 - 41 U/L 16 - -  ALT 0 - 44 U/L 13 - -    CBC Latest Ref Rng & Units 03/19/2021 03/19/2021 03/18/2021  WBC 4.0 - 10.5 K/uL 10.3 - -  Hemoglobin 12.0 - 15.0 g/dL 12.6 11.9(L) 11.9(L)  Hematocrit 36.0 - 46.0 % 38.7 35.0(L) 35.0(L)  Platelets 150 - 400 K/uL 242 - -    ABG    Component Value Date/Time   PHART 7.435 03/19/2021 0425   PCO2ART 39.9 03/19/2021 0425   PO2ART 209 (H) 03/19/2021 0425   HCO3 27.0 03/19/2021 0425   TCO2 28 03/19/2021 0425   ACIDBASEDEF 2.0 03/18/2021 1853   O2SAT 100.0 03/19/2021 0425    CBG (last 3)  Recent Labs    03/18/21 2106 03/18/21 2321 03/19/21 0355  [facility] Problem list     Assessment & Plan:   Intentional overdose with hx of Bipolar and paranoid schizophrenia. - RASS goal 0 to -1 - UDS positive for benzo's - will need psych assessment once she is medically stable - hold outpt ativan, haldol, amantadine, restoril  Compromised airway in setting of overdose. - full vent support until neuro status improved  Hx of hyperthyroidism. - resume propranolol at 20 mg bid, and methimazole at 10 mg bid  Hypokalemia, hypomagnesemia. - f/u electrolytes  Best practice (right click and "Reselect all SmartList Selections" daily)  Diet:  Tube feeds Pain/Anxiety/Delirium protocol (if indicated): Yes (RASS goal -1) VAP protocol (if indicated): Yes DVT prophylaxis: lovenox GI prophylaxis: PPI Glucose control:  SSI Yes Central venous access:  N/A Arterial line:   N/A Foley:  N/A Mobility:  bed rest  PT consulted: N/A Last date of multidisciplinary goals of care discussion [Pending] Code Status:  full code Disposition: ICU  Critical care time: 33 minutes  Chesley Mires, MD [place] Pager - [phone] 03/19/2021, 7:51 AM

## 2021-03-19 NOTE — Progress Notes (Signed)
[facility] ADULT ICU REPLACEMENT PROTOCOL   The patient does apply for the [facility] Adult ICU Electrolyte Replacment Protocol based on the criteria listed below:   1. Is GFR >/= 30 ml/min? Yes.    Patient's GFR today is 59 2. Is SCr </= 2? Yes.   Patient's SCr is 1.1 ml/kg/hr 3. Did SCr increase >/= 0.5 in 24 hours? No. 4. Abnormal electrolyte(s):k 3.3 mag 1.6 5. Ordered repletion with: protocol per tube and PIV 6. If a panic level lab has been reported, has the CCM MD in charge been notified? No..   Physician:    Ronda Fairly A 03/19/2021 6:03 AM

## 2021-03-19 NOTE — Progress Notes (Signed)
Spoke with patient's brother Dannielle Huh. His contact info is now in the chart. Updates were given and questions were answered. He is also in contact with [name]. He was appreciative of our care and support. Deland Slocumb, Rande Brunt, RN

## 2021-03-20 ENCOUNTER — Inpatient Hospital Stay (HOSPITAL_COMMUNITY): Payer: 59

## 2021-03-20 DIAGNOSIS — J988 Other specified respiratory disorders: Secondary | ICD-10-CM | POA: Diagnosis not present

## 2021-03-20 DIAGNOSIS — T50902A Poisoning by unspecified drugs, medicaments and biological substances, intentional self-harm, initial encounter: Secondary | ICD-10-CM | POA: Diagnosis not present

## 2021-03-20 LAB — GLUCOSE, CAPILLARY
Glucose-Capillary: 112 mg/dL — ABNORMAL HIGH (ref 70–99)
Glucose-Capillary: 114 mg/dL — ABNORMAL HIGH (ref 70–99)
Glucose-Capillary: 116 mg/dL — ABNORMAL HIGH (ref 70–99)
Glucose-Capillary: 123 mg/dL — ABNORMAL HIGH (ref 70–99)
Glucose-Capillary: 125 mg/dL — ABNORMAL HIGH (ref 70–99)
Glucose-Capillary: 153 mg/dL — ABNORMAL HIGH (ref 70–99)

## 2021-03-20 LAB — BASIC METABOLIC PANEL
Anion gap: 8 (ref 5–15)
BUN: 14 mg/dL (ref 6–20)
CO2: 25 mmol/L (ref 22–32)
Calcium: 9.7 mg/dL (ref 8.9–10.3)
Chloride: 109 mmol/L (ref 98–111)
Creatinine, Ser: 1.43 mg/dL — ABNORMAL HIGH (ref 0.44–1.00)
GFR, Estimated: 43 mL/min — ABNORMAL LOW (ref >60)
Glucose, Bld: 137 mg/dL — ABNORMAL HIGH (ref 70–99)
Potassium: 4.6 mmol/L (ref 3.5–5.1)
Sodium: 142 mmol/L (ref 135–145)

## 2021-03-20 LAB — CBC
HCT: 37.8 % (ref 36.0–46.0)
Hemoglobin: 12.3 g/dL (ref 12.0–15.0)
MCH: 28.3 pg (ref 26.0–34.0)
MCHC: 32.5 g/dL (ref 30.0–36.0)
MCV: 86.9 fL (ref 80.0–100.0)
Platelets: 235 10*3/uL (ref 150–400)
RBC: 4.35 MIL/uL (ref 3.87–5.11)
RDW: 14.3 % (ref 11.5–15.5)
WBC: 11 10*3/uL — ABNORMAL HIGH (ref 4.0–10.5)
nRBC: 0 % (ref 0.0–0.2)

## 2021-03-20 LAB — CARBAMAZEPINE LEVEL, TOTAL
Carbamazepine Lvl: 16.8 ug/mL (ref 4.0–12.0)
Carbamazepine Lvl: 15.4 ug/mL (ref 4.0–12.0)
Carbamazepine Lvl: 14.9 ug/mL — ABNORMAL HIGH (ref 4.0–12.0)
Carbamazepine Lvl: 13.2 ug/mL — ABNORMAL HIGH (ref 4.0–12.0)

## 2021-03-20 LAB — MAGNESIUM: Magnesium: 3.2 mg/dL — ABNORMAL HIGH (ref 1.7–2.4)

## 2021-03-20 LAB — PHOSPHORUS: Phosphorus: 1.7 mg/dL — ABNORMAL LOW (ref 2.5–4.6)

## 2021-03-20 MED ORDER — SODIUM PHOSPHATES 45 MMOLE/15ML IV SOLN
20.0000 mmol | Freq: Once | INTRAVENOUS | Status: AC
  Administered 2021-03-20: 20 mmol via INTRAVENOUS
  Filled 2021-03-20: qty 6.67

## 2021-03-20 NOTE — Progress Notes (Signed)
Pharmacy Electrolyte Replacement  Recent Labs:  Recent Labs    03/20/21 0000  K 4.6  MG 3.2*  PHOS 1.7*  CREATININE 1.43*    Low Critical Values (K </= 2.5, Phos </= 1, Mg </= 1) Present: None  MD Contacted: Sood  Plan:  - Phos 1.7 - give lower dose of NaPhos 20 mmol x 1 with bump up in SCr - Recheck Phos in the AM  Thank you for allowing pharmacy to be a part of this patient's care.  Alycia Rossetti, PharmD, BCPS Clinical Pharmacist Clinical phone for 03/20/2021: [phone] 03/20/2021 8:08 AM   **Pharmacist phone directory can now be found on [email or web address] ([email or web address]).  Listed under [place].

## 2021-03-20 NOTE — Progress Notes (Signed)
NAME:  Paula Dickson, MRN:  [id number], DOB:  November 18, 1966, LOS: 2 ADMISSION DATE:  03/18/2021, CONSULTATION DATE:  03/18/21 REFERRING MD:  Dr. Ralene Bathe, ER, CHIEF COMPLAINT:  Overdose   History of Present Illness:  55 yo female found at home by family member unresponsive on the floor.  EMS called and patient found with gurgling respirations and surrounded by pill bottles.  Required intubation for airway protection.  Family did not come to ER with patient.  Last known normal time was in evening of 03/17/21.  Had recent admission for hypernatremia and AKI.  Has hx of medical non-compliance.     Pertinent  Medical History  Hypothyroidism, PAF, Bipolar, Paranoid schizophrenia, HTN  Significant Hospital Events: Including procedures, antibiotic start and stop dates in addition to other pertinent events   . Admit; CT head >> no acute findings  Interim History / Subjective:  Remains on sedation, full vent support.  Objective   Blood pressure (!) 154/79, pulse 84, temperature 97.8 F (36.6 C), temperature source Axillary, resp. rate 20, height 5\' 3"  (1.6 m), weight 60 kg, SpO2 100 %.    Vent Mode: PRVC FiO2 (%):  [30 %] 30 % Set Rate:  [20 bmp] 20 bmp Vt Set:  [420 mL] 420 mL PEEP:  [5 cmH20] 5 cmH20 Plateau Pressure:  [13 cmH20-14 cmH20] 13 cmH20   Intake/Output Summary (Last 24 hours) at 03/20/2021 0741 Last data filed at 03/20/2021 0600 Gross per 24 hour  Intake 2200.58 ml  Output 1100 ml  Net 1100.58 ml   Filed Weights   03/18/21 1830  Weight: 60 kg    Examination:  General - sedated Eyes - pupils reactive ENT - ETT in place Cardiac - regular rate/rhythm, no murmur Chest - equal breath sounds b/l, no wheezing or rales Abdomen - soft, non tender, + bowel sounds Extremities - no cyanosis, clubbing, or edema Skin - no rashes Neuro - RASS -3  Labs/imaging that I havepersonally reviewed  (right click and "Reselect all SmartList Selections" daily)   CMP Latest Ref Rng & Units  03/20/2021 03/19/2021 03/19/2021  Glucose 70 - 99 mg/dL 137(H) 122(H) -  BUN 6 - 20 mg/dL 14 9 -  Creatinine 0.44 - 1.00 mg/dL 1.43(H) 1.10(H) -  Sodium 135 - 145 mmol/L 142 142 144  Potassium 3.5 - 5.1 mmol/L 4.6 3.3(L) 3.2(L)  Chloride 98 - 111 mmol/L 109 105 -  CO2 22 - 32 mmol/L 25 27 -  Calcium 8.9 - 10.3 mg/dL 9.7 9.5 -  Total Protein 6.5 - 8.1 g/dL - 5.7(L) -  Total Bilirubin 0.3 - 1.2 mg/dL - 1.4(H) -  Alkaline Phos 38 - 126 U/L - 62 -  AST 15 - 41 U/L - 16 -  ALT 0 - 44 U/L - 13 -    CBC Latest Ref Rng & Units 03/20/2021 03/19/2021 03/19/2021  WBC 4.0 - 10.5 K/uL 11.0(H) 10.3 -  Hemoglobin 12.0 - 15.0 g/dL 12.3 12.6 11.9(L)  Hematocrit 36.0 - 46.0 % 37.8 38.7 35.0(L)  Platelets 150 - 400 K/uL 235 242 -    ABG    Component Value Date/Time   PHART 7.435 03/19/2021 0425   PCO2ART 39.9 03/19/2021 0425   PO2ART 209 (H) 03/19/2021 0425   HCO3 27.0 03/19/2021 0425   TCO2 28 03/19/2021 0425   ACIDBASEDEF 2.0 03/18/2021 1853   O2SAT 100.0 03/19/2021 0425    CBG (last 3)  Recent Labs    03/19/21 1949 03/19/21 2339 03/20/21 0344  GLUCAP  116* [facility] Problem list     Assessment & Plan:   Intentional overdose with hx of Bipolar and paranoid schizophrenia. - RASS goal 0 to -1; try to limit sedation while pushing vent weaning - UDS positive for benzo's - will need psych assessment once she is medically stable - hold outpt ativan, haldol, amantadine, restoril - f/u carbamazepine level  Compromised airway in setting of overdose. - push vent weaning  Hx of hyperthyroidism. - continue propranolol 20 mg bid (half home dose) and methimazole 10 mg bid  Best practice (right click and "Reselect all SmartList Selections" daily)  Diet:  Tube feeds Pain/Anxiety/Delirium protocol (if indicated): Yes (RASS goal 0) VAP protocol (if indicated): Yes DVT prophylaxis: lovenox GI prophylaxis: PPI Glucose control:  SSI Yes Central venous access:   N/A Arterial line:  N/A Foley:  N/A Mobility:  bed rest  PT consulted: N/A Last date of multidisciplinary goals of care discussion [Pending] Code Status:  full code Disposition: ICU  Critical care time: 32 minutes  Chesley Mires, MD [place] Pager - [phone] 03/20/2021, 7:41 AM

## 2021-03-20 NOTE — Progress Notes (Signed)
RT NOTE: attempted SBT on patient this AM due to patient starting to gag with ETT however once patient settled, patient went apneic and backup ventilation alarmed.  Patient placed back on full support settings and is currently tolerating well.  Will continue to monitor.

## 2021-03-21 DIAGNOSIS — T50902A Poisoning by unspecified drugs, medicaments and biological substances, intentional self-harm, initial encounter: Secondary | ICD-10-CM | POA: Diagnosis not present

## 2021-03-21 DIAGNOSIS — J9601 Acute respiratory failure with hypoxia: Secondary | ICD-10-CM | POA: Diagnosis not present

## 2021-03-21 DIAGNOSIS — F209 Schizophrenia, unspecified: Secondary | ICD-10-CM | POA: Diagnosis not present

## 2021-03-21 DIAGNOSIS — J9690 Respiratory failure, unspecified, unspecified whether with hypoxia or hypercapnia: Principal | ICD-10-CM

## 2021-03-21 LAB — BASIC METABOLIC PANEL
Anion gap: 9 (ref 5–15)
BUN: 18 mg/dL (ref 6–20)
CO2: 26 mmol/L (ref 22–32)
Calcium: 10 mg/dL (ref 8.9–10.3)
Chloride: 114 mmol/L — ABNORMAL HIGH (ref 98–111)
Creatinine, Ser: 1.04 mg/dL — ABNORMAL HIGH (ref 0.44–1.00)
GFR, Estimated: >60 mL/min (ref >60)
Glucose, Bld: 120 mg/dL — ABNORMAL HIGH (ref 70–99)
Potassium: 4.1 mmol/L (ref 3.5–5.1)
Sodium: 149 mmol/L — ABNORMAL HIGH (ref 135–145)

## 2021-03-21 LAB — GLUCOSE, CAPILLARY
Glucose-Capillary: 120 mg/dL — ABNORMAL HIGH (ref 70–99)
Glucose-Capillary: 123 mg/dL — ABNORMAL HIGH (ref 70–99)
Glucose-Capillary: 107 mg/dL — ABNORMAL HIGH (ref 70–99)
Glucose-Capillary: 145 mg/dL — ABNORMAL HIGH (ref 70–99)
Glucose-Capillary: 116 mg/dL — ABNORMAL HIGH (ref 70–99)

## 2021-03-21 LAB — PHOSPHORUS: Phosphorus: 4 mg/dL (ref 2.5–4.6)

## 2021-03-21 LAB — MAGNESIUM: Magnesium: 2.2 mg/dL (ref 1.7–2.4)

## 2021-03-21 LAB — CARBAMAZEPINE LEVEL, TOTAL
Carbamazepine Lvl: 11.7 ug/mL (ref 4.0–12.0)
Carbamazepine Lvl: 11.1 ug/mL (ref 4.0–12.0)

## 2021-03-21 MED ORDER — DEXMEDETOMIDINE HCL IN NACL 400 MCG/100ML IV SOLN
0.4000 ug/kg/h | INTRAVENOUS | Status: DC
  Administered 2021-03-21: 0.8 ug/kg/h via INTRAVENOUS
  Administered 2021-03-21 – 2021-03-22 (×4): 1.2 ug/kg/h via INTRAVENOUS
  Filled 2021-03-21 (×5): qty 100

## 2021-03-21 MED ORDER — PHENOBARBITAL SODIUM 65 MG/ML IJ SOLN
200.0000 mg | Freq: Once | INTRAMUSCULAR | Status: AC
  Administered 2021-03-21: 200 mg via INTRAVENOUS
  Filled 2021-03-21: qty 4

## 2021-03-21 NOTE — Progress Notes (Signed)
NAME:  Paula Dickson, MRN:  [id number], DOB:  [birth year], LOS: 3 ADMISSION DATE:  03/18/2021, CONSULTATION DATE:  03/18/21 REFERRING MD:  Dr. Ralene Bathe, ER, CHIEF COMPLAINT:  Overdose   History of Present Illness:  55 yo female found at home by family member unresponsive on the floor.  EMS called and patient found with gurgling respirations and surrounded by pill bottles.  Required intubation for airway protection.  Family did not come to ER with patient.  Last known normal time was in evening of 03/17/21.  Had recent admission for hypernatremia and AKI.  Has hx of medical non-compliance.     Pertinent  Medical History  Hypothyroidism, PAF, Bipolar, Paranoid schizophrenia, HTN  Significant Hospital Events: Including procedures, antibiotic start and stop dates in addition to other pertinent events   . 4/22 Admit; CT head >> no acute findings . 4/25 Trial Vent weaning unsuccessful secondary to agitation    Tubes/Lines ETT 4/22-     Interim History / Subjective:  Remains on sedation, full vent support.  Objective   Blood pressure 116/62, pulse (!) 107, temperature 98.6 F (37 C), temperature source Axillary, resp. rate 16, height 5\' 3"  (1.6 m), weight 60 kg, SpO2 100 %.    Vent Mode: PRVC FiO2 (%):  [30 %] 30 % Set Rate:  [16 bmp] 16 bmp Vt Set:  [420 mL] 420 mL PEEP:  [5 cmH20] 5 cmH20 Plateau Pressure:  [11 cmH20-14 cmH20] 13 cmH20   Intake/Output Summary (Last 24 hours) at 03/21/2021 0758 Last data filed at 03/21/2021 0600 Gross per 24 hour  Intake 1845.15 ml  Output 1525 ml  Net 320.15 ml   Filed Weights   03/18/21 1830  Weight: 60 kg    General:  Elderly F, sedated on propofol, no distress HEENT: MM pink/moist, ETT in place, sclera anicteric Neuro: pupils reactive, examined on propofol and groaning to sternal rub but otherwise unresponsive.  Per RN when off propofol is moving all extremities  CV: s1s2 rrr, no m/r/g PULM:  Decreased air entry bilateral bases, but  otherwise clear without rhonchi or wheezing GI: soft, bsx4 active  Extremities: warm/dry, no edema  Skin: no rashes or lesions   Labs/imaging that I havepersonally reviewed  (right click and "Reselect all SmartList Selections" daily)   CMP Latest Ref Rng & Units 03/20/2021 03/19/2021 03/19/2021  Glucose 70 - 99 mg/dL 137(H) 122(H) -  BUN 6 - 20 mg/dL 14 9 -  Creatinine 0.44 - 1.00 mg/dL 1.43(H) 1.10(H) -  Sodium 135 - 145 mmol/L 142 142 144  Potassium 3.5 - 5.1 mmol/L 4.6 3.3(L) 3.2(L)  Chloride 98 - 111 mmol/L 109 105 -  CO2 22 - 32 mmol/L 25 27 -  Calcium 8.9 - 10.3 mg/dL 9.7 9.5 -  Total Protein 6.5 - 8.1 g/dL - 5.7(L) -  Total Bilirubin 0.3 - 1.2 mg/dL - 1.4(H) -  Alkaline Phos 38 - 126 U/L - 62 -  AST 15 - 41 U/L - 16 -  ALT 0 - 44 U/L - 13 -    CBC Latest Ref Rng & Units 03/20/2021 03/19/2021 03/19/2021  WBC 4.0 - 10.5 K/uL 11.0(H) 10.3 -  Hemoglobin 12.0 - 15.0 g/dL 12.3 12.6 11.9(L)  Hematocrit 36.0 - 46.0 % 37.8 38.7 35.0(L)  Platelets 150 - 400 K/uL 235 242 -    ABG    Component Value Date/Time   PHART 7.435 03/19/2021 0425   PCO2ART 39.9 03/19/2021 0425   PO2ART 209 (H) 03/19/2021 0425  HCO3 27.0 03/19/2021 0425   TCO2 28 03/19/2021 0425   ACIDBASEDEF 2.0 03/18/2021 1853   O2SAT 100.0 03/19/2021 0425    CBG (last 3)  Recent Labs    03/20/21 2004 03/20/21 2353 03/21/21 0359  GLUCAP 116* 153* 120*     Resolved Hospital Problem list     Assessment & Plan:   Intentional Benzodiazepine and Tegretol overdose with hx of Bipolar and paranoid schizophrenia. Trial SAT/SBT unsuccessful secondary to agitation off prop P: -trial Precedex, monitor for signs of Benzo withdrawal, consider dose phenobarbital  -RASS goal 0 to -1 -Psych eval once stable -continue to hold outpatient Ativan, haldol, Amantadine and Restoril -Tegretol level improved to WNL   Compromised airway in setting of overdose. -trial Precedex and continue to pursue SAT/SBT  Hx of  hyperthyroidism. -continue propranolol 20mg  and Methimazole 10mg  bid, TSH <0.010    Best practice (right click and "Reselect all SmartList Selections" daily)  Diet:  Tube feeds Pain/Anxiety/Delirium protocol (if indicated): Yes (RASS goal 0) VAP protocol (if indicated): Yes DVT prophylaxis: lovenox GI prophylaxis: PPI Glucose control:  SSI Yes Central venous access:  N/A Arterial line:  N/A Foley:  N/A Mobility:  bed rest  PT consulted: N/A Last date of multidisciplinary goals of care discussion [Pending] Code Status:  full code Disposition: ICU  Critical care time: 35 minutes     CRITICAL CARE Performed by: Otilio Carpen Aneshia Jacquet   Total critical care time: 35 minutes  Critical care time was exclusive of separately billable procedures and treating other patients.  Critical care was necessary to treat or prevent imminent or life-threatening deterioration.  Critical care was time spent personally by me on the following activities: development of treatment plan with patient and/or surrogate as well as nursing, discussions with consultants, evaluation of patient's response to treatment, examination of patient, obtaining history from patient or surrogate, ordering and performing treatments and interventions, ordering and review of laboratory studies, ordering and review of radiographic studies, pulse oximetry and re-evaluation of patient's condition.    Otilio Carpen Ulmer Degen, PA-C [facility] See Amion for pager If no response to pager , please call [phone] until 7pm After 7:00 pm call Elink  [phone]?[place]

## 2021-03-21 NOTE — Progress Notes (Signed)
Attempted to reach patient's sister for update but no answer to home phone.

## 2021-03-22 ENCOUNTER — Inpatient Hospital Stay (HOSPITAL_COMMUNITY): Payer: 59

## 2021-03-22 DIAGNOSIS — J9601 Acute respiratory failure with hypoxia: Secondary | ICD-10-CM | POA: Diagnosis not present

## 2021-03-22 DIAGNOSIS — F209 Schizophrenia, unspecified: Secondary | ICD-10-CM | POA: Diagnosis not present

## 2021-03-22 DIAGNOSIS — T50902A Poisoning by unspecified drugs, medicaments and biological substances, intentional self-harm, initial encounter: Secondary | ICD-10-CM | POA: Diagnosis not present

## 2021-03-22 LAB — POCT I-STAT EG7
Acid-Base Excess: 1 mmol/L (ref 0.0–2.0)
Bicarbonate: 27.4 mmol/L (ref 20.0–28.0)
Calcium, Ion: 1.21 mmol/L (ref 1.15–1.40)
HCT: 36 % (ref 36.0–46.0)
Hemoglobin: 12.2 g/dL (ref 12.0–15.0)
O2 Saturation: 99 %
Potassium: 3 mmol/L — ABNORMAL LOW (ref 3.5–5.1)
Sodium: 140 mmol/L (ref 135–145)
TCO2: 29 mmol/L (ref 22–32)
pCO2, Ven: 49.6 mmHg (ref 44.0–60.0)
pH, Ven: 7.35 (ref 7.250–7.430)
pO2, Ven: 138 mmHg — ABNORMAL HIGH (ref 32.0–45.0)

## 2021-03-22 LAB — GLUCOSE, CAPILLARY
Glucose-Capillary: 111 mg/dL — ABNORMAL HIGH (ref 70–99)
Glucose-Capillary: 127 mg/dL — ABNORMAL HIGH (ref 70–99)
Glucose-Capillary: 135 mg/dL — ABNORMAL HIGH (ref 70–99)
Glucose-Capillary: 137 mg/dL — ABNORMAL HIGH (ref 70–99)
Glucose-Capillary: 140 mg/dL — ABNORMAL HIGH (ref 70–99)
Glucose-Capillary: 148 mg/dL — ABNORMAL HIGH (ref 70–99)

## 2021-03-22 LAB — TRIGLYCERIDES: Triglycerides: 126 mg/dL (ref <150)

## 2021-03-22 LAB — I-STAT BETA HCG BLOOD, ED (NOT ORDERABLE): I-stat hCG, quantitative: <5 m[IU]/mL (ref <5)

## 2021-03-22 MED ORDER — PHENYLEPHRINE 40 MCG/ML (10ML) SYRINGE FOR IV PUSH (FOR BLOOD PRESSURE SUPPORT)
PREFILLED_SYRINGE | INTRAVENOUS | Status: AC
  Administered 2021-03-22: 120 ug
  Filled 2021-03-22: qty 10

## 2021-03-22 MED ORDER — ROCURONIUM BROMIDE 10 MG/ML (PF) SYRINGE
PREFILLED_SYRINGE | INTRAVENOUS | Status: AC
  Filled 2021-03-22: qty 10

## 2021-03-22 MED ORDER — HALOPERIDOL 1 MG PO TABS
2.0000 mg | ORAL_TABLET | Freq: Two times a day (BID) | ORAL | Status: DC
  Administered 2021-03-22 – 2021-03-24 (×5): 2 mg
  Filled 2021-03-22 (×6): qty 2

## 2021-03-22 MED ORDER — ROCURONIUM BROMIDE 50 MG/5ML IV SOLN
50.0000 mg | Freq: Once | INTRAVENOUS | Status: AC
  Administered 2021-03-22: 50 mg via INTRAVENOUS
  Filled 2021-03-22: qty 5

## 2021-03-22 MED ORDER — SODIUM CHLORIDE 0.9 % IV BOLUS
500.0000 mL | Freq: Once | INTRAVENOUS | Status: AC
  Administered 2021-03-22: 500 mL via INTRAVENOUS

## 2021-03-22 MED ORDER — AMANTADINE HCL 100 MG PO CAPS
100.0000 mg | ORAL_CAPSULE | Freq: Two times a day (BID) | ORAL | Status: DC
  Administered 2021-03-22 – 2021-03-23 (×4): 100 mg
  Filled 2021-03-22 (×6): qty 1

## 2021-03-22 MED ORDER — CHLORHEXIDINE GLUCONATE 0.12% ORAL RINSE (MEDLINE KIT)
15.0000 mL | Freq: Two times a day (BID) | OROMUCOSAL | Status: DC
  Administered 2021-03-22 – 2021-03-24 (×4): 15 mL via OROMUCOSAL

## 2021-03-22 MED ORDER — RINGERS IV SOLN: INTRAVENOUS | Status: DC

## 2021-03-22 MED ORDER — BENZTROPINE MESYLATE 1 MG PO TABS
1.0000 mg | ORAL_TABLET | Freq: Every day | ORAL | Status: DC
  Administered 2021-03-22 – 2021-03-23 (×2): 1 mg
  Filled 2021-03-22 (×3): qty 1

## 2021-03-22 MED ORDER — CLOZAPINE 100 MG PO TABS: 100.0000 mg | ORAL_TABLET | Freq: Every day | ORAL | Status: DC

## 2021-03-22 MED ORDER — CARBAMAZEPINE 100 MG PO CHEW: 200.0000 mg | CHEWABLE_TABLET | Freq: Two times a day (BID) | ORAL | Status: DC

## 2021-03-22 MED ORDER — PROPOFOL 1000 MG/100ML IV EMUL
5.0000 ug/kg/min | INTRAVENOUS | Status: DC
  Administered 2021-03-22: 20 ug/kg/min via INTRAVENOUS
  Administered 2021-03-22 – 2021-03-23 (×2): 50 ug/kg/min via INTRAVENOUS
  Administered 2021-03-23 (×2): 60 ug/kg/min via INTRAVENOUS
  Administered 2021-03-23: 50 ug/kg/min via INTRAVENOUS
  Administered 2021-03-23: 80 ug/kg/min via INTRAVENOUS
  Administered 2021-03-23: 50 ug/kg/min via INTRAVENOUS
  Administered 2021-03-24 (×2): 60 ug/kg/min via INTRAVENOUS
  Filled 2021-03-22 (×9): qty 100

## 2021-03-22 MED ORDER — CLOZAPINE 25 MG PO TABS: 25.0000 mg | ORAL_TABLET | Freq: Every day | ORAL | Status: DC

## 2021-03-22 MED ORDER — TRAZODONE HCL 50 MG PO TABS
100.0000 mg | ORAL_TABLET | Freq: Every day | ORAL | Status: DC
  Administered 2021-03-22 – 2021-03-23 (×2): 100 mg
  Filled 2021-03-22 (×3): qty 2

## 2021-03-22 MED ORDER — PHENOBARBITAL SODIUM 65 MG/ML IJ SOLN
65.0000 mg | Freq: Once | INTRAMUSCULAR | Status: AC
  Administered 2021-03-22: 65 mg via INTRAVENOUS
  Filled 2021-03-22: qty 1

## 2021-03-22 MED ORDER — DEXAMETHASONE SODIUM PHOSPHATE 10 MG/ML IJ SOLN
8.0000 mg | Freq: Two times a day (BID) | INTRAMUSCULAR | Status: DC
  Administered 2021-03-22 (×2): 8 mg via INTRAVENOUS
  Filled 2021-03-22 (×2): qty 1

## 2021-03-22 MED ORDER — HALOPERIDOL 5 MG PO TABS: 10.0000 mg | ORAL_TABLET | Freq: Every day | ORAL | Status: DC

## 2021-03-22 MED ORDER — ORAL CARE MOUTH RINSE
15.0000 mL | OROMUCOSAL | Status: DC
  Administered 2021-03-22 – 2021-03-24 (×17): 15 mL via OROMUCOSAL

## 2021-03-22 MED ORDER — PROPOFOL 1000 MG/100ML IV EMUL
INTRAVENOUS | Status: AC
  Filled 2021-03-22: qty 100

## 2021-03-22 NOTE — Progress Notes (Signed)
Pt reintubated by CCM MD due to respiratory insufficiency.  7.5/24 Lip

## 2021-03-22 NOTE — Progress Notes (Signed)
Contacted [employer] to get maintenance fluid orders clarified. Will continue to monitor.

## 2021-03-22 NOTE — Progress Notes (Signed)
[facility] Progress Note Patient Name: Paula Dickson DOB: [birth year] MRN: [id number]   Date of Service  03/22/2021  HPI/Events of Note  Asked to correct duplicate orders for IV fluids  eICU Interventions  LR at 75 cc/hr to be used     Intervention Category Intermediate Interventions: Other:  Margaretmary Lombard 03/22/2021, 8:45 PM

## 2021-03-22 NOTE — Procedures (Signed)
Extubation Procedure Note  Patient Details:   Name: Paula Dickson DOB: 11-21-1966 MRN: [id number]   Airway Documentation:    Vent end date: 03/22/21 Vent end time: 1128   Evaluation  O2 sats: stable throughout Complications: No apparent complications Patient did tolerate procedure well. Bilateral Breath Sounds: Rhonchi,Diminished   Yes  Gonzella Lex 03/22/2021, 11:31 AM

## 2021-03-22 NOTE — Progress Notes (Signed)
NAME:  Paula Dickson, MRN:  [id number], DOB:  [birth year], LOS: 4 ADMISSION DATE:  03/18/2021, CONSULTATION DATE:  03/18/21 REFERRING MD:  Dr. Ralene Bathe, ER, CHIEF COMPLAINT:  Overdose   History of Present Illness:  55 yo female found at home by family member unresponsive on the floor.  EMS called and patient found with gurgling respirations and surrounded by pill bottles.  Required intubation for airway protection.  Family did not come to ER with patient.  Last known normal time was in evening of 03/17/21.  Had recent admission for hypernatremia and AKI.  Has hx of medical non-compliance.     Pertinent  Medical History  Hypothyroidism, PAF, Bipolar, Paranoid schizophrenia, HTN  Significant Hospital Events: Including procedures, antibiotic start and stop dates in addition to other pertinent events   . 4/22 Admit; CT head >> no acute findings . 4/25 Trial Vent weaning unsuccessful secondary to agitation . 4/26 Attempt SBT    Tubes/Lines ETT 4/22-     Interim History / Subjective:  Agitated with periods of apnea on propofol yesterday, agitation improved after phenobarbital load.  This AM awake and responsive, but still agitated and not tolerating PSV.    Objective   Blood pressure (!) 142/85, pulse 94, temperature 99.5 F (37.5 C), temperature source Oral, resp. rate 16, height 5\' 3"  (1.6 m), weight 60 kg, SpO2 100 %.    Vent Mode: PRVC FiO2 (%):  [30 %] 30 % Set Rate:  [16 bmp] 16 bmp Vt Set:  [420 mL] 420 mL PEEP:  [5 cmH20] 5 cmH20 Plateau Pressure:  [12 cmH20-20 cmH20] 20 cmH20   Intake/Output Summary (Last 24 hours) at 03/22/2021 4401 Last data filed at 03/22/2021 0600 Gross per 24 hour  Intake 1559.4 ml  Output 2750 ml  Net -1190.6 ml   Filed Weights   03/18/21 1830  Weight: 60 kg   General:  Elderly F, intubated and awake, follows commands but agitated  HEENT: MM pink/moist, ETT in place Neuro: pupils equal, moving all extremities CV: s1s2 rrr, no m/r/g PULM:  Clear  bilaterally without rhonchi or wheezing, periods of tachypnea and then apnea when changed to pressure support, improved when flipped back to [facility] GI: soft, bsx4 active  Extremities: warm/dry, no edema  Skin: no rashes or lesions    Labs/imaging that I havepersonally reviewed  (right click and "Reselect all SmartList Selections" daily)   CMP Latest Ref Rng & Units 03/21/2021 03/20/2021 03/19/2021  Glucose 70 - 99 mg/dL 120(H) 137(H) 122(H)  BUN 6 - 20 mg/dL 18 14 9   Creatinine 0.44 - 1.00 mg/dL 1.04(H) 1.43(H) 1.10(H)  Sodium 135 - 145 mmol/L 149(H) 142 142  Potassium 3.5 - 5.1 mmol/L 4.1 4.6 3.3(L)  Chloride 98 - 111 mmol/L 114(H) 109 105  CO2 22 - 32 mmol/L 26 25 27   Calcium 8.9 - 10.3 mg/dL 10.0 9.7 9.5  Total Protein 6.5 - 8.1 g/dL - - 5.7(L)  Total Bilirubin 0.3 - 1.2 mg/dL - - 1.4(H)  Alkaline Phos 38 - 126 U/L - - 62  AST 15 - 41 U/L - - 16  ALT 0 - 44 U/L - - 13    CBC Latest Ref Rng & Units 03/20/2021 03/19/2021 03/19/2021  WBC 4.0 - 10.5 K/uL 11.0(H) 10.3 -  Hemoglobin 12.0 - 15.0 g/dL 12.3 12.6 11.9(L)  Hematocrit 36.0 - 46.0 % 37.8 38.7 35.0(L)  Platelets 150 - 400 K/uL 235 242 -    ABG    Component Value Date/Time  PHART 7.435 03/19/2021 0425   PCO2ART 39.9 03/19/2021 0425   PO2ART 209 (H) 03/19/2021 0425   HCO3 27.0 03/19/2021 0425   TCO2 28 03/19/2021 0425   ACIDBASEDEF 2.0 03/18/2021 1853   O2SAT 100.0 03/19/2021 0425    CBG (last 3)  Recent Labs    03/22/21 0000 03/22/21 0343 03/22/21 0810  GLUCAP 137* 135* 127*     Resolved Hospital Problem list     Assessment & Plan:   Intentional Benzodiazepine and Tegretol overdose with hx of Bipolar and paranoid schizophrenia. Difficulty moving towards extubation secondary to agitation, likely both delirium and possible withdrawal, though HR normal and BP moderately elevated.  Improved after loading dose Phenobarbital and Precedex yesterday, but agitated again this AM P: -continue Precedex, give  additional 65mg  Phenobarbital this AM, trial SBT when calmer -Rass goal 0 to 1 -psych eval once stable -continue to hold outpatient Ativan, haldol, Amantadine and Restoril -Tegretol level improved to WNL   Compromised airway in setting of overdose -agitation continues to be the main barrier to extubation - Precedex and one time dose 65mg  Phenobarbital, trial SBT when agitation improves  Hx of hyperthyroidism. -continue propranolol 20mg  and Methimazole 10mg  bid, TSH <0.010    Best practice (right click and "Reselect all SmartList Selections" daily)  Diet:  Tube feeds Pain/Anxiety/Delirium protocol (if indicated): Yes (RASS goal 0) VAP protocol (if indicated): Yes DVT prophylaxis: lovenox GI prophylaxis: PPI Glucose control:  SSI Yes Central venous access:  N/A Arterial line:  N/A Foley:  N/A Mobility:  bed rest  PT consulted: N/A Last date of multidisciplinary goals of care discussion [Pending] Code Status:  full code Disposition: ICU  Critical care time: 33 minutes     CRITICAL CARE Performed by: Otilio Carpen Darrie Macmillan   Total critical care time: 33 minutes  Critical care time was exclusive of separately billable procedures and treating other patients.  Critical care was necessary to treat or prevent imminent or life-threatening deterioration.  Critical care was time spent personally by me on the following activities: development of treatment plan with patient and/or surrogate as well as nursing, discussions with consultants, evaluation of patient's response to treatment, examination of patient, obtaining history from patient or surrogate, ordering and performing treatments and interventions, ordering and review of laboratory studies, ordering and review of radiographic studies, pulse oximetry and re-evaluation of patient's condition.    Otilio Carpen Hermenegildo Clausen, PA-C [facility] See Amion for pager If no response to pager , please call [phone] until  7pm After 7:00 pm call Elink  [phone]?[place]

## 2021-03-22 NOTE — Consult Note (Addendum)
Psychiatry Consult Note  Paula Dickson is a 55 year old female with past history of schizophrenia, bipolar, hypothyroidism, hypertension found unresponsive on the floor.  EMS was called and the patient found gurgling respirations and surrounded by pill bottles.(Benzodiazepine and Tegretol).  patient required intubation for airway protection.  Patient has recent admission for hypernatremia and AKI and history of medical noncompliance.  Psych consult was placed due to suicidal attempt by overdose.  Patient was extubated today but had to be reintubated due to respiratory insufficiency. Patient cannot be evaluated at this time as patient is on mechanical ventilation. Will D/c Consult at this time.  We will evaluate patient when medically stable. Dr. Lynetta Mare informed.   Dr. Armando Reichert MD [facility] PGY1 Psychiatry.

## 2021-03-22 NOTE — Procedures (Signed)
Intubation Procedure Note  Paula Dickson  [id number]  1966-05-23  Date:03/22/21  Time:2:09 PM   Provider Performing:Tyreisha Ungar    Procedure: Intubation (47096)  Indication(s) Respiratory Failure  Consent Unable to obtain consent due to emergent nature of procedure.   Anesthesia Versed, Fentanyl, Rocuronium and Propofol   Time Out Verified patient identification, verified procedure, site/side was marked, verified correct patient position, special equipment/implants available, medications/allergies/relevant history reviewed, required imaging and test results available.   Sterile Technique Usual hand hygeine, masks, and gloves were used   Procedure Description Patient positioned in bed supine.  Sedation given as noted above.  Patient was intubated with endotracheal tube using Glidescope.  View was Grade 1 full glottis .  Number of attempts was 1.  Colorimetric CO2 detector was consistent with tracheal placement.   Complications/Tolerance None; patient tolerated the procedure well. Chest X-ray is ordered to verify placement.   Findings:  Small glottic opening with edematous appearing posterior arytenoids.   Kipp Brood, MD [facility] ICU Physician [place]  Pager: [phone] Or Epic Secure Chat After hours: [phone].  03/22/2021, 2:11 PM

## 2021-03-23 DIAGNOSIS — Z01818 Encounter for other preprocedural examination: Secondary | ICD-10-CM

## 2021-03-23 DIAGNOSIS — G9340 Encephalopathy, unspecified: Secondary | ICD-10-CM

## 2021-03-23 DIAGNOSIS — J9601 Acute respiratory failure with hypoxia: Secondary | ICD-10-CM | POA: Diagnosis not present

## 2021-03-23 LAB — CBC
HCT: 35.9 % — ABNORMAL LOW (ref 36.0–46.0)
Hemoglobin: 11.3 g/dL — ABNORMAL LOW (ref 12.0–15.0)
MCH: 28.3 pg (ref 26.0–34.0)
MCHC: 31.5 g/dL (ref 30.0–36.0)
MCV: 90 fL (ref 80.0–100.0)
Platelets: 245 10*3/uL (ref 150–400)
RBC: 3.99 MIL/uL (ref 3.87–5.11)
RDW: 14.1 % (ref 11.5–15.5)
WBC: 7.8 10*3/uL (ref 4.0–10.5)
nRBC: 0 % (ref 0.0–0.2)

## 2021-03-23 LAB — BASIC METABOLIC PANEL
Anion gap: 10 (ref 5–15)
BUN: 25 mg/dL — ABNORMAL HIGH (ref 6–20)
CO2: 26 mmol/L (ref 22–32)
Calcium: 10 mg/dL (ref 8.9–10.3)
Chloride: 113 mmol/L — ABNORMAL HIGH (ref 98–111)
Creatinine, Ser: 0.95 mg/dL (ref 0.44–1.00)
GFR, Estimated: >60 mL/min (ref >60)
Glucose, Bld: 180 mg/dL — ABNORMAL HIGH (ref 70–99)
Potassium: 4.6 mmol/L (ref 3.5–5.1)
Sodium: 149 mmol/L — ABNORMAL HIGH (ref 135–145)

## 2021-03-23 LAB — MAGNESIUM: Magnesium: 2.1 mg/dL (ref 1.7–2.4)

## 2021-03-23 LAB — PHOSPHORUS: Phosphorus: 3.1 mg/dL (ref 2.5–4.6)

## 2021-03-23 LAB — GLUCOSE, CAPILLARY
Glucose-Capillary: 133 mg/dL — ABNORMAL HIGH (ref 70–99)
Glucose-Capillary: 139 mg/dL — ABNORMAL HIGH (ref 70–99)
Glucose-Capillary: 142 mg/dL — ABNORMAL HIGH (ref 70–99)
Glucose-Capillary: 145 mg/dL — ABNORMAL HIGH (ref 70–99)
Glucose-Capillary: 150 mg/dL — ABNORMAL HIGH (ref 70–99)
Glucose-Capillary: 181 mg/dL — ABNORMAL HIGH (ref 70–99)

## 2021-03-23 LAB — TRIGLYCERIDES: Triglycerides: 273 mg/dL — ABNORMAL HIGH (ref <150)

## 2021-03-23 MED ORDER — DEXAMETHASONE SODIUM PHOSPHATE 10 MG/ML IJ SOLN
6.0000 mg | Freq: Two times a day (BID) | INTRAMUSCULAR | Status: DC
  Administered 2021-03-23 – 2021-03-24 (×3): 6 mg via INTRAVENOUS
  Filled 2021-03-23 (×3): qty 1

## 2021-03-23 NOTE — Progress Notes (Signed)
NAME:  Paula Dickson, MRN:  [id number], DOB:  [birth year], LOS: 5 ADMISSION DATE:  03/18/2021, CONSULTATION DATE:  03/18/21 REFERRING MD:  Dr. Ralene Bathe, ER, CHIEF COMPLAINT:  Overdose   History of Present Illness:  55 yo female found at home by family member unresponsive on the floor.  EMS called and patient found with gurgling respirations and surrounded by pill bottles.  Required intubation for airway protection.  Family did not come to ER with patient.  Last known normal time was in evening of 03/17/21.  Had recent admission for hypernatremia and AKI.  Has hx of medical non-compliance.     Pertinent  Medical History  Hypothyroidism, PAF, Bipolar, Paranoid schizophrenia, HTN  Significant Hospital Events: Including procedures, antibiotic start and stop dates in addition to other pertinent events   4/22 Admit; CT head >> no acute findings 4/25 Trial Vent weaning unsuccessful secondary to agitation 4/26 extubated but later re-intubated for stridor  Tubes/Lines ETT 4/22 > 4/26.  4/26 >    Interim History / Subjective:  Remains on 50 Propofol.  Is arouseable and will follow intermittent basic commands. No stridor.  Objective   Blood pressure 132/76, pulse 96, temperature 98.8 F (37.1 C), temperature source Axillary, resp. rate (!) 23, height 5\' 3"  (1.6 m), weight 60 kg, SpO2 100 %.    Vent Mode: PRVC FiO2 (%):  [40 %-100 %] 40 % Set Rate:  [16 bmp] 16 bmp Vt Set:  [420 mL] 420 mL PEEP:  [5 cmH20] 5 cmH20 Plateau Pressure:  [12 cmH20-15 cmH20] 12 cmH20   Intake/Output Summary (Last 24 hours) at 03/23/2021 0855 Last data filed at 03/23/2021 0800 Gross per 24 hour  Intake 3921.39 ml  Output 1100 ml  Net 2821.39 ml   Filed Weights   03/18/21 1830  Weight: 60 kg   General: Adult female, resting in bed, in NAD. Neuro: Sedated but arouseable and follows basic commands. HEENT: [place]/AT. Sclerae anicteric. ETT in place. Cardiovascular: RRR, no M/R/G.  Lungs: Respirations even and  unlabored.  CTA bilaterally, No W/R/R. Abdomen: BS x 4, soft, NT/ND.  Musculoskeletal: No gross deformities, no edema.  Skin: Intact, warm, no rashes.   Resolved Hospital Problem list     Assessment & Plan:   Intentional Benzodiazepine and Tegretol overdose with hx of Bipolar and paranoid schizophrenia. Difficulty moving towards extubation secondary to agitation, likely both delirium and possible withdrawal, though HR normal and BP moderately elevated.  Improved after loading dose Phenobarbital and Precedex 4/25. - Continue supportive care. - Continue propofol for today with hopes d/c tomorrow prior to extubation (allowing an additional day of decadron given stridor after extubation 4/26). - Continue low dose haldol. - Continue to hold home Ativan, Restoril, Tegretol. - Psych eval once stable.  Compromised airway in setting of overdose.  S/p extubation 0/10 but complicated by stridor requiring re-intubation and initiation of decadron. - Continue full vent support today. - Continue Decadron x 3 more doses (would equal 2 days). - Reattempt extubation tomorrow as long as no stridor and vent mechanics are acceptable.  Hx of hyperthyroidism. - Continue propranolol 20mg  and Methimazole 10mg  BID.   Best practice (right click and "Reselect all SmartList Selections" daily)  Diet:  Tube feeds Pain/Anxiety/Delirium protocol (if indicated): Yes (RASS goal 0) VAP protocol (if indicated): Yes DVT prophylaxis: lovenox GI prophylaxis: PPI Glucose control:  SSI Yes Central venous access:  N/A Arterial line:  N/A Foley:  N/A Mobility:  bed rest  PT consulted: N/A Last date  of multidisciplinary goals of care discussion [Pending] Code Status:  full code Disposition: ICU  Critical care time: 30 minutes   Huxley Shurley Shearon Stalls, PA - C [facility] [facility] For pager details, please see AMION or use Epic chat  After 1900, please call [place] for cross coverage needs 03/23/2021,  9:05 AM

## 2021-03-24 DIAGNOSIS — F200 Paranoid schizophrenia: Secondary | ICD-10-CM

## 2021-03-24 DIAGNOSIS — J9601 Acute respiratory failure with hypoxia: Secondary | ICD-10-CM | POA: Diagnosis not present

## 2021-03-24 LAB — BASIC METABOLIC PANEL
Anion gap: 7 (ref 5–15)
BUN: 28 mg/dL — ABNORMAL HIGH (ref 6–20)
CO2: 28 mmol/L (ref 22–32)
Calcium: 9.9 mg/dL (ref 8.9–10.3)
Chloride: 114 mmol/L — ABNORMAL HIGH (ref 98–111)
Creatinine, Ser: 0.86 mg/dL (ref 0.44–1.00)
GFR, Estimated: >60 mL/min (ref >60)
Glucose, Bld: 114 mg/dL — ABNORMAL HIGH (ref 70–99)
Potassium: 4.1 mmol/L (ref 3.5–5.1)
Sodium: 149 mmol/L — ABNORMAL HIGH (ref 135–145)

## 2021-03-24 LAB — CBC
HCT: 32.5 % — ABNORMAL LOW (ref 36.0–46.0)
Hemoglobin: 10.3 g/dL — ABNORMAL LOW (ref 12.0–15.0)
MCH: 28 pg (ref 26.0–34.0)
MCHC: 31.7 g/dL (ref 30.0–36.0)
MCV: 88.3 fL (ref 80.0–100.0)
Platelets: 283 10*3/uL (ref 150–400)
RBC: 3.68 MIL/uL — ABNORMAL LOW (ref 3.87–5.11)
RDW: 14.2 % (ref 11.5–15.5)
WBC: 9.8 10*3/uL (ref 4.0–10.5)
nRBC: 0 % (ref 0.0–0.2)

## 2021-03-24 LAB — MAGNESIUM: Magnesium: 2.2 mg/dL (ref 1.7–2.4)

## 2021-03-24 LAB — GLUCOSE, CAPILLARY
Glucose-Capillary: 106 mg/dL — ABNORMAL HIGH (ref 70–99)
Glucose-Capillary: 97 mg/dL (ref 70–99)
Glucose-Capillary: 115 mg/dL — ABNORMAL HIGH (ref 70–99)
Glucose-Capillary: 100 mg/dL — ABNORMAL HIGH (ref 70–99)
Glucose-Capillary: 76 mg/dL (ref 70–99)

## 2021-03-24 LAB — PHOSPHORUS: Phosphorus: 2.9 mg/dL (ref 2.5–4.6)

## 2021-03-24 MED ORDER — LABETALOL HCL 5 MG/ML IV SOLN
10.0000 mg | INTRAVENOUS | Status: DC | PRN
  Administered 2021-03-24 – 2021-03-25 (×6): 10 mg via INTRAVENOUS
  Filled 2021-03-24 (×4): qty 4

## 2021-03-24 MED ORDER — BENZTROPINE MESYLATE 1 MG PO TABS
1.0000 mg | ORAL_TABLET | Freq: Every day | ORAL | Status: DC
  Filled 2021-03-24: qty 1

## 2021-03-24 MED ORDER — ORAL CARE MOUTH RINSE
15.0000 mL | Freq: Two times a day (BID) | OROMUCOSAL | Status: DC
  Administered 2021-03-25 – 2021-03-28 (×4): 15 mL via OROMUCOSAL

## 2021-03-24 MED ORDER — LORAZEPAM 2 MG/ML IJ SOLN
2.0000 mg | Freq: Once | INTRAMUSCULAR | Status: AC
  Administered 2021-03-27: 2 mg via INTRAVENOUS
  Filled 2021-03-24: qty 1

## 2021-03-24 MED ORDER — DOCUSATE SODIUM 100 MG PO CAPS
100.0000 mg | ORAL_CAPSULE | Freq: Every day | ORAL | Status: DC | PRN
  Administered 2021-03-27: 100 mg via ORAL

## 2021-03-24 MED ORDER — PROPRANOLOL HCL 20 MG PO TABS
20.0000 mg | ORAL_TABLET | Freq: Two times a day (BID) | ORAL | Status: DC
  Administered 2021-03-25: 20 mg via ORAL
  Filled 2021-03-24 (×2): qty 1

## 2021-03-24 MED ORDER — TRAZODONE HCL 50 MG PO TABS
100.0000 mg | ORAL_TABLET | Freq: Every day | ORAL | Status: DC
  Filled 2021-03-24: qty 2

## 2021-03-24 MED ORDER — BENZTROPINE MESYLATE 1 MG PO TABS
1.0000 mg | ORAL_TABLET | Freq: Every day | ORAL | Status: DC
  Filled 2021-03-24 (×2): qty 1

## 2021-03-24 MED ORDER — HALOPERIDOL 5 MG PO TABS
5.0000 mg | ORAL_TABLET | Freq: Every day | ORAL | Status: DC
  Administered 2021-03-24: 5 mg via ORAL
  Filled 2021-03-24 (×2): qty 1

## 2021-03-24 MED ORDER — AMLODIPINE BESYLATE 10 MG PO TABS
10.0000 mg | ORAL_TABLET | Freq: Every day | ORAL | Status: DC
  Administered 2021-03-24: 10 mg via ORAL
  Filled 2021-03-24 (×2): qty 1

## 2021-03-24 MED ORDER — HALOPERIDOL 1 MG PO TABS: 2.0000 mg | ORAL_TABLET | Freq: Every day | ORAL | Status: DC

## 2021-03-24 MED ORDER — PANTOPRAZOLE SODIUM 40 MG PO TBEC
40.0000 mg | DELAYED_RELEASE_TABLET | Freq: Every day | ORAL | Status: DC
  Filled 2021-03-24: qty 1

## 2021-03-24 MED ORDER — HALOPERIDOL 1 MG PO TABS
2.0000 mg | ORAL_TABLET | Freq: Every day | ORAL | Status: DC
  Filled 2021-03-24: qty 2

## 2021-03-24 MED ORDER — HYDROCHLOROTHIAZIDE 25 MG PO TABS
25.0000 mg | ORAL_TABLET | Freq: Every day | ORAL | Status: DC
  Administered 2021-03-24: 25 mg via ORAL
  Filled 2021-03-24 (×2): qty 1

## 2021-03-24 MED ORDER — HALOPERIDOL 5 MG PO TABS: 5.0000 mg | ORAL_TABLET | Freq: Every day | ORAL | Status: DC

## 2021-03-24 MED ORDER — METHIMAZOLE 10 MG PO TABS
10.0000 mg | ORAL_TABLET | Freq: Two times a day (BID) | ORAL | Status: DC
  Filled 2021-03-24 (×3): qty 1

## 2021-03-24 MED ORDER — AMANTADINE HCL 100 MG PO CAPS
100.0000 mg | ORAL_CAPSULE | Freq: Two times a day (BID) | ORAL | Status: DC
  Administered 2021-03-25: 100 mg via ORAL
  Filled 2021-03-24 (×3): qty 1

## 2021-03-24 MED ORDER — LABETALOL HCL 5 MG/ML IV SOLN
10.0000 mg | Freq: Once | INTRAVENOUS | Status: AC
  Administered 2021-03-24: 10 mg via INTRAVENOUS
  Filled 2021-03-24: qty 4

## 2021-03-24 MED ORDER — TRAZODONE HCL 50 MG PO TABS: 100.0000 mg | ORAL_TABLET | Freq: Every day | ORAL | Status: DC

## 2021-03-24 NOTE — Progress Notes (Signed)
Extubated by Caryl Pina, RRT. Patient alert but not oriented. Confused conversation and not answering questions appropriately. Hoarse voice. Sinus tach rate low 100's. 94% on 4 L nasal cannula. No respiratory distress noted.

## 2021-03-24 NOTE — Progress Notes (Signed)
Safety sitter at bedside 

## 2021-03-24 NOTE — Progress Notes (Signed)
Discussed with Dr. Lynetta Mare during rounds that patient's blood pressure is high staying in the 170/90's range and that she is NPO and not cooperative at this time and there are no PRN meds ordered for blood pressure. MD stated "give her a little bit and we will see." No new orders at this time.

## 2021-03-24 NOTE — Progress Notes (Signed)
Spoke with Dr. Lynetta Mare and made MD aware that patient did not pass nursing bedside swallow screen, that she swallowed fine but that after drinking she coughed, but to note that patient did have a strong productive cough prior to swallow screen.  MD gave order for speech evaluation and NPO except sips with meds.

## 2021-03-24 NOTE — Progress Notes (Signed)
RN made Dr. Lynetta Mare aware that patient is restless and agitated at times and that blood pressure is elevated consistently 559R systolic and low 416L diastolic. MD gave one time order for 10 mg IV labetalol once and 2mg  IV ativan once.

## 2021-03-24 NOTE — Progress Notes (Signed)
Patient being uncooperative and noncompliant. Patient refused to allow temperature and CBG to be taken. Patient refusing to take medications. Patient refusing to allow this RN to perform most of assessment. Patient spits on mitts and attempts to rub secretions on RN. Patient attempts to hit RN.

## 2021-03-24 NOTE — Progress Notes (Signed)
NAME:  Paula Dickson, MRN:  [id number], DOB:  1966/04/08, LOS: 15 ADMISSION DATE:  03/18/2021, CONSULTATION DATE:  03/18/21 REFERRING MD:  Dr. Ralene Bathe, ER, CHIEF COMPLAINT:  Overdose   History of Present Illness:  55 yo female found at home by family member unresponsive on the floor.  EMS called and patient found with gurgling respirations and surrounded by pill bottles.  Required intubation for airway protection.  Family did not come to ER with patient.  Last known normal time was in evening of 03/17/21.  Had recent admission for hypernatremia and AKI.  Has hx of medical non-compliance.     Pertinent  Medical History  Hypothyroidism, PAF, Bipolar, Paranoid schizophrenia, HTN  Significant Hospital Events: Including procedures, antibiotic start and stop dates in addition to other pertinent events     4/22 Admit; CT head >> no acute findings  4/25 Trial Vent weaning unsuccessful secondary to agitation  4/26 extubated but later re-intubated for stridor  Interim History / Subjective:  No overnight events. Still intermittent agitation but less marked than previously.   Objective   Blood pressure (!) 141/68, pulse 75, temperature 99.5 F (37.5 C), temperature source Axillary, resp. rate 16, height 5\' 3"  (1.6 m), weight 60 kg, SpO2 97 %.    Vent Mode: PRVC FiO2 (%):  [40 %] 40 % Set Rate:  [16 bmp] 16 bmp Vt Set:  [420 mL] 420 mL PEEP:  [5 cmH20] 5 cmH20 Plateau Pressure:  [12 cmH20-14 cmH20] 13 cmH20   Intake/Output Summary (Last 24 hours) at 03/24/2021 0821 Last data filed at 03/24/2021 0800 Gross per 24 hour  Intake 3562.76 ml  Output 2625 ml  Net 937.76 ml   Filed Weights   03/18/21 1830  Weight: 60 kg   General: Adult female, resting in bed, in NAD. Neuro: Sedated but arouseable and follows basic commands. HEENT: [place]/AT. Sclerae anicteric. ETT in place. Cardiovascular: RRR, no M/R/G.  Lungs: Respirations even and unlabored.  CTA bilaterally, No W/R/R. Abdomen: BS x 4, soft,  NT/ND.  Musculoskeletal: No gross deformities, no edema.  Skin: Intact, warm, no rashes.  Resolved Hospital Problem list     Assessment & Plan:   Intentional Benzodiazepine and Tegretol overdose with hx of Bipolar and paranoid schizophrenia. Compromised airway in setting of overdose.   S/p extubation 9/62 but complicated by stridor requiring re-intubation and initiation of decadron. Hx of hyperthyroidism.  Plan: - Reattempt extubation this morning. - Continue usual psychiatric medications. - Psychiatric assessment once extubated.  - Continue propranolol 20mg  and Methimazole 10mg  BID.   Best practice (right click and "Reselect all SmartList Selections" daily)  Diet:  Tube feeds on hold for extubation. swallow evaluation post extubation.  Pain/Anxiety/Delirium protocol (if indicated): Yes (RASS goal 0) VAP protocol (if indicated): Yes DVT prophylaxis: lovenox GI prophylaxis: PPI Glucose control:  SSI Yes Central venous access:  N/A Arterial line:  N/A Foley:  N/A Mobility:  bed rest  PT consulted: N/A Last date of multidisciplinary goals of care discussion [Pending] Code Status:  full code Disposition: ICU  CRITICAL CARE Performed by: Kipp Brood   Total critical care time: 35 minutes  Critical care time was exclusive of separately billable procedures and treating other patients.  Critical care was necessary to treat or prevent imminent or life-threatening deterioration.  Critical care was time spent personally by me on the following activities: development of treatment plan with patient and/or surrogate as well as nursing, discussions with consultants, evaluation of patient's response to treatment, examination  of patient, obtaining history from patient or surrogate, ordering and performing treatments and interventions, ordering and review of laboratory studies, ordering and review of radiographic studies, pulse oximetry, re-evaluation of patient's condition and  participation in multidisciplinary rounds.  Kipp Brood, MD [facility] ICU Physician [place]  Pager: [phone] Mobile: [phone] After hours: [phone].

## 2021-03-24 NOTE — Procedures (Signed)
Extubation Procedure Note  Patient Details:   Name: Paula Dickson DOB: [birth year] MRN: [id number]   Airway Documentation:    Vent end date: 03/24/21 Vent end time: 0848   Evaluation  O2 sats: stable throughout Complications: No apparent complications Patient did tolerate procedure well. Bilateral Breath Sounds: Rhonchi   Yes   Patient was extubated to a 4L [place] without any complications, dyspnea or stridor noted. Patient was instructed on IS x 5, highest goal reached was 717mL.   Claretta Fraise 03/24/2021, 8:48 AM

## 2021-03-24 NOTE — Progress Notes (Signed)
Patient restless and when blood pressure cycles patient moves and talks.  Blood pressure reads higher when patient is restless.

## 2021-03-24 NOTE — Consult Note (Signed)
[facility] Psychiatry Consult   Reason for Consult: Self harm behavior and overdose Referring Physician:  Kipp Brood Patient Identification: Paula Dickson MRN:  [id number] Principal Diagnosis: <principal problem not specified> Diagnosis:  Active Problems:   Overdose   Respiratory failure ([place])   Schizophrenia ([place])   Encounter for intubation   Encephalopathy acute   Total Time spent with patient: 45 minutes  Subjective:   Paula Dickson is a 55 y.o. female with past medical history significant for Schizoaffective disorder bipolar type, hypertension and hypothyroidism found unresponsive on the floor.  Patient was found with gurgling respirations and surrounded by pill bottles.(Medications from home brought with the patient at bedside -Tegretol, lorazepam 1 mg, Klonopin, amlodipine 10 mg Haldol 2 mg). Most of the benzodiazepines were gone. Patient required intubation for airway protection.  Patient was extubated today. Psych consult was placed for self injurious behavior Patient seen and examined today.  Patient is a poor historian.  Patient is confused and disorganized with loose thought process.  Patient asked writer if she is a "God lover and a loving person" and only then she would talk to me.  Patient says to Probation officer " Are you going to hell or heaven".  Patient cried constantly throughout the evaluation and talked in whispering voice.  Patient states she can see and hear God.  And she has been feeling depressed since her mom and dad died.  When asked about why she is here, she says " I do not know". She states "it is too much" " they call me trash".  She does not explain what happened and why she overdosed.  When asked about current suicidal ideation she states "I should have hurt myself a long time ago". She states he has been in different hospitals including [facility] and [facility] but [facility] felt like prison to her.  She states she has a diagnosis of schizophrenia and  bipolar.  Patient denies drinking alcohol, or using any recreational drugs.  Patient lives alone and on disability.  On examination, patient is alert, confused and and her thought process is loose and disorganized.  Patient mood is depressed and affect is tearful and depressed.  Patient is paranoid and delusional. patient constantly cried throughout the assessment. Endorsed auditory and visual hallucinations.  Endorsed passive suicidal ideation with no plan, denies HI.  Collateral from sister [name] Codispoti @[phone] -Sister states patient has a diagnosis of schizophrenia and bipolar and had been admitted to [facility] multiple times.  Patient states she has been very depressed since the death of his mom and dad.  Her Mom died in 01-29-2010 and dad died in 01/29/2019.  Sister states she talked to her in the morning but then when she reached her home she saw her unconscious with pill bottles.  Sister states she does not have any ACTT team. She states that Pt has been noncompliant with her medications.  She had been on long acting injectable in the past but not anymore.  She follows with [name].  She was recently discharged from [facility] long hospital on March 27th where she was there for 20 days.  Patient lives alone and on disability.  Labs reviewed U tox positive for benzodiazepines, salicylate level less than 7, Sodium 149, potassium 4.1, creatinine 0.86, WBCs 9.8, hemoglobin 10.3, glucose 114 TSH <0.010 Chart review Patient has 2 charts and multiple admissions to [facility] in the past for bizarre and combative behaviour  Patient was recently admitted to [facility] for acute  kidney injury on 02/03/2021 and was discharged on 02/20/2021 where her psych medications were adjusted.  Recommended medication on discharge are Haldol 2 mg p.o. twice daily, amantadine 100 mg twice daily, Ativan 1 mg p.o. twice daily as needed for catatonia and agitation, temazepam 30 mg p.o. nightly. Clozapine was stopped at that time. She  presented to [facility] hospital on 12/29/2020 when she was brought by police for running outside naked and was agitated and was transferred to [facility]  Patient was placed on IVC. Pt discharged from [facility] on 01/21/2021. She was admitted to [facility] on 10/26/2020 to 11/12/2020 Patient managed through [facility].  She received long-term injectable in the past.  Past Psychiatric History: Schizoaffective disorder bipolar type- See H&P Trial and failed multiple psychotropic medication to include Haldol, risperidone, Haldol decanoate, Cogentin, carbamazepine, clozapine, hydroxyzine, temazepam.   Risk to Self:   Yes Risk to Others:  No Prior Inpatient Therapy:  Yes Prior Outpatient Therapy:  No  Past Medical History: No past medical history on file.  Schizoaffective Bipolar type, Pt has 2 charts. Family History: No family history on file. Family Psychiatric  History: Brother- Bipolar Social History:  Social History   Substance and Sexual Activity  Alcohol Use Not on file     Social History   Substance and Sexual Activity  Drug Use Not on file    Social History   Socioeconomic History  . Marital status: Single    Spouse name: Not on file  . Number of children: Not on file  . Years of education: Not on file  . Highest education level: Not on file  Occupational History  . Not on file  Tobacco Use  . Smoking status: Not on file  . Smokeless tobacco: Not on file  Substance and Sexual Activity  . Alcohol use: Not on file  . Drug use: Not on file  . Sexual activity: Not on file  Other Topics Concern  . Not on file  Social History Narrative  . Not on file   Social Determinants of Health   Financial Resource Strain: Not on file  Food Insecurity: Not on file  Transportation Needs: Not on file  Physical Activity: Not on file  Stress: Not on file  Social Connections: Not on file   Additional Social History:    Allergies:  No Known Allergies  Labs:   Results for orders placed or performed during the hospital encounter of 03/18/21 (from the past 48 hour(s))  Glucose, capillary     Status: Abnormal   Collection Time: 03/22/21  3:51 PM  Result Value Ref Range   Glucose-Capillary 111 (H) 70 - 99 mg/dL    Comment: Glucose reference range applies only to samples taken after fasting for at least 8 hours.  Glucose, capillary     Status: Abnormal   Collection Time: 03/22/21  7:57 PM  Result Value Ref Range   Glucose-Capillary 148 (H) 70 - 99 mg/dL    Comment: Glucose reference range applies only to samples taken after fasting for at least 8 hours.  Glucose, capillary     Status: Abnormal   Collection Time: 03/22/21 11:36 PM  Result Value Ref Range   Glucose-Capillary 140 (H) 70 - 99 mg/dL    Comment: Glucose reference range applies only to samples taken after fasting for at least 8 hours.  Glucose, capillary     Status: Abnormal   Collection Time: 03/23/21  3:45 AM  Result Value Ref Range  Glucose-Capillary 181 (H) 70 - 99 mg/dL    Comment: Glucose reference range applies only to samples taken after fasting for at least 8 hours.  Triglycerides     Status: Abnormal   Collection Time: 03/23/21  4:37 AM  Result Value Ref Range   Triglycerides 273 (H) <150 mg/dL    Comment: Performed at [facility] [address], [place], [place] [address]  CBC     Status: Abnormal   Collection Time: 03/23/21  4:37 AM  Result Value Ref Range   WBC 7.8 4.0 - 10.5 K/uL   RBC 3.99 3.87 - 5.11 MIL/uL   Hemoglobin 11.3 (L) 12.0 - 15.0 g/dL   HCT 35.9 (L) 36.0 - 46.0 %   MCV 90.0 80.0 - 100.0 fL   MCH 28.3 26.0 - 34.0 pg   MCHC 31.5 30.0 - 36.0 g/dL   RDW 14.1 11.5 - 15.5 %   Platelets 245 150 - 400 K/uL   nRBC 0.0 0.0 - 0.2 %    Comment: Performed at [facility], [place] [address] [place], [place] [address]  Basic metabolic panel     Status: Abnormal   Collection Time: 03/23/21  4:37 AM  Result Value Ref Range   Sodium 149 (H) 135 - 145  mmol/L   Potassium 4.6 3.5 - 5.1 mmol/L   Chloride 113 (H) 98 - 111 mmol/L   CO2 26 22 - 32 mmol/L   Glucose, Bld 180 (H) 70 - 99 mg/dL    Comment: Glucose reference range applies only to samples taken after fasting for at least 8 hours.   BUN 25 (H) 6 - 20 mg/dL   Creatinine, Ser 0.95 0.44 - 1.00 mg/dL   Calcium 10.0 8.9 - 10.3 mg/dL   GFR, Estimated >60 >60 mL/min    Comment: (NOTE) Calculated using the CKD-EPI Creatinine Equation (2021)    Anion gap 10 5 - 15    Comment: Performed at [facility] [address] [place], [place] [address]  Magnesium     Status: None   Collection Time: 03/23/21  4:37 AM  Result Value Ref Range   Magnesium 2.1 1.7 - 2.4 mg/dL    Comment: Performed at [facility] [address], [place], [place] [address]  Phosphorus     Status: None   Collection Time: 03/23/21  4:37 AM  Result Value Ref Range   Phosphorus 3.1 2.5 - 4.6 mg/dL    Comment: Performed at [facility] [address], [place], [place] [address]  Glucose, capillary     Status: Abnormal   Collection Time: 03/23/21  7:11 AM  Result Value Ref Range   Glucose-Capillary 142 (H) 70 - 99 mg/dL    Comment: Glucose reference range applies only to samples taken after fasting for at least 8 hours.  Glucose, capillary     Status: Abnormal   Collection Time: 03/23/21 11:59 AM  Result Value Ref Range   Glucose-Capillary 133 (H) 70 - 99 mg/dL    Comment: Glucose reference range applies only to samples taken after fasting for at least 8 hours.  Glucose, capillary     Status: Abnormal   Collection Time: 03/23/21  3:42 PM  Result Value Ref Range   Glucose-Capillary 150 (H) 70 - 99 mg/dL    Comment: Glucose reference range applies only to samples taken after fasting for at least 8 hours.  Glucose, capillary     Status: Abnormal  Collection Time: 03/23/21  7:40 PM  Result Value Ref Range   Glucose-Capillary 139 (H) 70 - 99 mg/dL    Comment: Glucose reference range applies only  to samples taken after fasting for at least 8 hours.  Glucose, capillary     Status: Abnormal   Collection Time: 03/23/21 11:39 PM  Result Value Ref Range   Glucose-Capillary 145 (H) 70 - 99 mg/dL    Comment: Glucose reference range applies only to samples taken after fasting for at least 8 hours.  Basic metabolic panel     Status: Abnormal   Collection Time: 03/24/21  3:05 AM  Result Value Ref Range   Sodium 149 (H) 135 - 145 mmol/L   Potassium 4.1 3.5 - 5.1 mmol/L   Chloride 114 (H) 98 - 111 mmol/L   CO2 28 22 - 32 mmol/L   Glucose, Bld 114 (H) 70 - 99 mg/dL    Comment: Glucose reference range applies only to samples taken after fasting for at least 8 hours.   BUN 28 (H) 6 - 20 mg/dL   Creatinine, Ser 0.86 0.44 - 1.00 mg/dL   Calcium 9.9 8.9 - 10.3 mg/dL   GFR, Estimated >60 >60 mL/min    Comment: (NOTE) Calculated using the CKD-EPI Creatinine Equation (2021)    Anion gap 7 5 - 15    Comment: Performed at [facility] [address], [place], [place] [address]  CBC     Status: Abnormal   Collection Time: 03/24/21  3:05 AM  Result Value Ref Range   WBC 9.8 4.0 - 10.5 K/uL   RBC 3.68 (L) 3.87 - 5.11 MIL/uL   Hemoglobin 10.3 (L) 12.0 - 15.0 g/dL   HCT 32.5 (L) 36.0 - 46.0 %   MCV 88.3 80.0 - 100.0 fL   MCH 28.0 26.0 - 34.0 pg   MCHC 31.7 30.0 - 36.0 g/dL   RDW 14.2 11.5 - 15.5 %   Platelets 283 150 - 400 K/uL   nRBC 0.0 0.0 - 0.2 %    Comment: Performed at [facility], [place] [address], [place], [place] [address]  Magnesium     Status: None   Collection Time: 03/24/21  3:05 AM  Result Value Ref Range   Magnesium 2.2 1.7 - 2.4 mg/dL    Comment: Performed at [facility] [address] [place], [place] [address]  Phosphorus     Status: None   Collection Time: 03/24/21  3:05 AM  Result Value Ref Range   Phosphorus 2.9 2.5 - 4.6 mg/dL    Comment: Performed at [facility] [address], [place], [place] [address]  Glucose, capillary     Status:  Abnormal   Collection Time: 03/24/21  3:30 AM  Result Value Ref Range   Glucose-Capillary 106 (H) 70 - 99 mg/dL    Comment: Glucose reference range applies only to samples taken after fasting for at least 8 hours.  Glucose, capillary     Status: None   Collection Time: 03/24/21  8:20 AM  Result Value Ref Range   Glucose-Capillary 97 70 - 99 mg/dL    Comment: Glucose reference range applies only to samples taken after fasting for at least 8 hours.  Glucose, capillary     Status: Abnormal   Collection Time: 03/24/21 11:54 AM  Result Value Ref Range   Glucose-Capillary 115 (H) 70 - 99 mg/dL    Comment: Glucose reference range applies only to samples  taken after fasting for at least 8 hours.    Current Facility-Administered Medications  Medication Dose Route Frequency Provider Last Rate Last Admin  . amantadine (SYMMETREL) capsule 100 mg  100 mg Per Tube BID Kipp Brood, MD   100 mg at 03/23/21 2156  . benztropine (COGENTIN) tablet 1 mg  1 mg Per Tube QHS Kipp Brood, MD   1 mg at 03/23/21 2155  . chlorhexidine gluconate (MEDLINE KIT) (PERIDEX) 0.12 % solution 15 mL  15 mL Mouth Rinse BID Kipp Brood, MD   15 mL at 03/24/21 0818  . Chlorhexidine Gluconate Cloth 2 % PADS 6 each  6 each Topical Q0600 Chesley Mires, MD   6 each at 03/24/21 1331  . docusate (COLACE) 50 MG/5ML liquid 100 mg  100 mg Per Tube Daily PRN Chesley Mires, MD      . enoxaparin (LOVENOX) injection 40 mg  40 mg Subcutaneous Q24H Chesley Mires, MD   40 mg at 03/24/21 1205  . fentaNYL (SUBLIMAZE) injection 50-200 mcg  50-200 mcg Intravenous Q30 min PRN Chesley Mires, MD   100 mcg at 03/22/21 1353  . haloperidol (HALDOL) tablet 2 mg  2 mg Per Tube BID Kipp Brood, MD   2 mg at 03/24/21 1208  . insulin aspart (novoLOG) injection 0-15 Units  0-15 Units Subcutaneous Q4H Chesley Mires, MD   2 Units at 03/24/21 0020  . lactated ringers infusion   Intravenous Continuous Margaretmary Lombard, MD 75 mL/hr at 03/24/21 1300 Infusion  Verify at 03/24/21 1300  . MEDLINE mouth rinse  15 mL Mouth Rinse 10 times per day Kipp Brood, MD   15 mL at 03/24/21 0608  . methimazole (TAPAZOLE) tablet 10 mg  10 mg Per Tube BID Chesley Mires, MD   10 mg at 03/23/21 2154  . midazolam (VERSED) injection 2 mg  2 mg Intravenous Q2H PRN Chesley Mires, MD   2 mg at 03/22/21 1353  . pantoprazole sodium (PROTONIX) 40 mg/20 mL oral suspension 40 mg  40 mg Per Tube Q24H Chesley Mires, MD   40 mg at 03/23/21 0951  . propranolol (INDERAL) 20 MG/5ML solution 20 mg  20 mg Per Tube BID Chesley Mires, MD   20 mg at 03/24/21 1208  . traZODone (DESYREL) tablet 100 mg  100 mg Per Tube QHS Kipp Brood, MD   100 mg at 03/23/21 2156    Musculoskeletal: Strength & Muscle Tone: within normal limits Gait & Station: normal, unable to stand Patient leans: N/A            Psychiatric Specialty Exam:  Presentation  General Appearance: Disheveled  Eye Contact:Fair  Speech:Other (comment) ([place])  Speech Volume:Decreased  Handedness:No data recorded  Mood and Affect  Mood:Depressed; Labile; Irritable  Affect:Tearful; Depressed   Thought Process  Thought Processes:Disorganized  Descriptions of Associations:Loose  Orientation:Partial (She knows that she is in the hospital, knows that it is 2022 but does not know the month.)  Thought Content:Delusions; Paranoid Ideation; Perseveration  History of Schizophrenia/Schizoaffective disorder:Yes  Duration of Psychotic Symptoms:Greater than six months  Hallucinations:Hallucinations: Auditory; Visual Description of Auditory Hallucinations: Hears God talking to her. Description of Visual Hallucinations: Sees God  Ideas of Reference:Delusions; Paranoia  Suicidal Thoughts:Suicidal Thoughts: Yes, Passive SI Passive Intent and/or Plan: Without Intent; Without Plan  Homicidal Thoughts:Homicidal Thoughts: No   Sensorium  Memory:Immediate Poor; Recent Poor; Remote  Poor  Judgment:Impaired  Insight:Poor   Executive Functions  Concentration:Poor  Attention Span:Poor  Recall:Poor  Fund of Knowledge:Poor  Language:Fair   Psychomotor Activity  Psychomotor Activity:Psychomotor Activity: Increased   Assets  Assets:Resilience; Social Support; Housing; Financial Resources/Insurance   Sleep  Sleep:Sleep: Poor   Physical Exam: Physical Exam Vitals and nursing note reviewed.  Constitutional:      General: She is in acute distress.     Appearance: She is not ill-appearing, toxic-appearing or diaphoretic.  HENT:     Head: Normocephalic.  Pulmonary:     Effort: Pulmonary effort is normal.  Neurological:     General: No focal deficit present.     Mental Status: She is alert.     Comments: Oriented to self, knows she is in the hospital, knows it is 2022 but does not know month.    ROS  Cannot complete ROS as Pt is non cooperative.  Blood pressure (!) 186/108, pulse 98, temperature 99.2 F (37.3 C), temperature source Axillary, resp. rate (!) 22, height 5' 3"  (1.6 m), weight 60 kg, SpO2 93 %. Body mass index is 23.43 kg/m.  Treatment Plan Summary: Case discussed with Dr. Dwyane Dee. Patient meets criteria for inpatient hospitalization to [facility].  -Patient needs inpatient hospitalization to [facility] psychiatry after medical clearance. Please contact CSW for placement to Inpatient Psychiatry after Pt is medically cleared.  -Continue amantadine 100 mg twice daily. -Increase Haldol to 5 mg nightly and Continue Haldol 2 mg daily. -Continue Cogentin 1 mg at bedtime.  Disposition: Recommend psychiatric Inpatient admission when medically cleared.  Armando Reichert, MD 03/24/2021 2:32 PM

## 2021-03-24 NOTE — Progress Notes (Signed)
Sent Dr. Lynetta Mare secure chat to make him aware that patient has had PO antihypertensives that he ordered and 2 PRN doses of IV labetalol and patient's BP remains high with current BP 177/102. MD messaged back in secure chat and said to give more PRN labetalol.

## 2021-03-24 NOTE — Progress Notes (Signed)
Patient did take PO antihypertensive meds and PRN labetolol was given prior to PO meds. Patient initially would not agree to take PO meds. BP remains high.  Continuing to monitor for effect of IV and PO meds.

## 2021-03-24 NOTE — Progress Notes (Signed)
Report given to Roderic Palau, RN and discussed with nurse that patient's blood pressure has been elevated during shift and this has been addressed with Dr. Lynetta Mare and that MD instructed this RN to continue to give labetalol IV PRN per order.

## 2021-03-24 NOTE — Evaluation (Signed)
Clinical/Bedside Swallow Evaluation Patient Details  Name: Paula Dickson MRN: [id number] Date of Birth: Sep 26, 1966  Today's Date: 03/24/2021 Time: SLP Start Time (ACUTE ONLY): 1400 SLP Stop Time (ACUTE ONLY): 1420 SLP Time Calculation (min) (ACUTE ONLY): 20 min  HPI:  55 yo female found at home by family member unresponsive on the floor.  EMS called and patient found with gurgling respirations and surrounded by pill bottles.  Required intubation for airway protection.  Family did not come to ER with patient.  Last known normal time was in evening of 03/17/21.  Had recent admission for hypernatremia and AKI.  Has hx of medical non-compliance. Intubated 4/22-4/26, reintubated 4/26-4/28. Failed [facility].   Assessment / Plan / Recommendation Clinical Impression  Pt demonstrates dysphonia following prolonged intubation and signs of acute post extubation dysphagia. Pt has repeatedly failed a 3 oz water swallow with immediate cough, suggesting decreased airway protection. Pt was able to consume 3 oz of nectar thick juice consecutively without signs of aspiration, as well as bites of puree. Given that pt has been cleared for sips with meds, requested RN give nectar thick sips or puree, instead of thin liquids for now. Pt likely to improve with time after extubation. Will f/u tomorrow for full diet initiation if pt willing to take solids. SLP Visit Diagnosis: Dysphagia, oropharyngeal phase (R13.12)    Aspiration Risk       Diet Recommendation Nectar-thick liquid   Liquid Administration via: Cup;Straw Medication Administration: Whole meds with liquid Compensations: Slow rate;Small sips/bites Postural Changes: Seated upright at 90 degrees    Other  Recommendations     Follow up Recommendations 24 hour supervision/assistance      Frequency and Duration min 2x/week  2 weeks       Prognosis Prognosis for Safe Diet Advancement: Good      Swallow Study   General HPI: 55 yo female found at home  by family member unresponsive on the floor.  EMS called and patient found with gurgling respirations and surrounded by pill bottles.  Required intubation for airway protection.  Family did not come to ER with patient.  Last known normal time was in evening of 03/17/21.  Had recent admission for hypernatremia and AKI.  Has hx of medical non-compliance. Intubated 4/22-4/26, reintubated 4/26-4/28. Failed [facility]. Type of Study: Bedside Swallow Evaluation Diet Prior to this Study: NPO;Other (Comment) (sips with meds) Temperature Spikes Noted: No Respiratory Status: Room air History of Recent Intubation: No Behavior/Cognition: Alert Oral Care Completed by SLP: No Oral Cavity - Dentition: Adequate natural dentition Self-Feeding Abilities: Total assist Patient Positioning: Partially reclined Baseline Vocal Quality: Hoarse Volitional Cough: Strong Volitional Swallow: Able to elicit    Oral/Motor/Sensory Function     Ice Chips Ice chips: Not tested   Thin Liquid Thin Liquid: Impaired Presentation: Straw Pharyngeal  Phase Impairments: Cough - Immediate    Nectar Thick Nectar Thick Liquid: Within functional limits Presentation: Straw   Honey Thick Honey Thick Liquid: Not tested   Puree Puree: Within functional limits Presentation: [place] Mariel Lukins, MA [place] Pager [phone] Office [phone]  Lynann Beaver 03/24/2021,2:33 PM

## 2021-03-24 NOTE — Progress Notes (Signed)
Patient refused PO meds therefore PRN labetalol IV given.

## 2021-03-24 NOTE — Progress Notes (Signed)
Made Dr. Lynetta Mare aware that patient's blood pressure improved slightly with labetalol dose but is now 194/106 without agitation. MD stated he would order PO meds.

## 2021-03-25 DIAGNOSIS — J9601 Acute respiratory failure with hypoxia: Secondary | ICD-10-CM | POA: Diagnosis not present

## 2021-03-25 LAB — GLUCOSE, CAPILLARY
Glucose-Capillary: 136 mg/dL — ABNORMAL HIGH (ref 70–99)
Glucose-Capillary: 191 mg/dL — ABNORMAL HIGH (ref 70–99)
Glucose-Capillary: 162 mg/dL — ABNORMAL HIGH (ref 70–99)

## 2021-03-25 LAB — MAGNESIUM: Magnesium: 2 mg/dL (ref 1.7–2.4)

## 2021-03-25 LAB — PHOSPHORUS: Phosphorus: 2.4 mg/dL — ABNORMAL LOW (ref 2.5–4.6)

## 2021-03-25 MED ORDER — HALOPERIDOL 1 MG PO TABS
2.0000 mg | ORAL_TABLET | Freq: Every day | ORAL | Status: DC
  Administered 2021-03-25 – 2021-03-29 (×5): 2 mg
  Filled 2021-03-25 (×5): qty 2

## 2021-03-25 MED ORDER — BENZTROPINE MESYLATE 1 MG PO TABS
1.0000 mg | ORAL_TABLET | Freq: Every day | ORAL | Status: DC
  Administered 2021-03-25 – 2021-03-28 (×4): 1 mg
  Filled 2021-03-25 (×6): qty 1

## 2021-03-25 MED ORDER — METHIMAZOLE 5 MG PO TABS
10.0000 mg | ORAL_TABLET | Freq: Two times a day (BID) | ORAL | Status: DC
  Administered 2021-03-25 – 2021-03-29 (×9): 10 mg
  Filled 2021-03-25: qty 1
  Filled 2021-03-25 (×9): qty 2
  Filled 2021-03-25: qty 1

## 2021-03-25 MED ORDER — HALOPERIDOL 1 MG PO TABS
5.0000 mg | ORAL_TABLET | Freq: Every day | ORAL | Status: DC
  Administered 2021-03-25 – 2021-03-28 (×4): 5 mg
  Filled 2021-03-25 (×4): qty 5
  Filled 2021-03-25: qty 1
  Filled 2021-03-25: qty 5

## 2021-03-25 MED ORDER — PROPRANOLOL HCL 40 MG PO TABS
20.0000 mg | ORAL_TABLET | Freq: Two times a day (BID) | ORAL | Status: DC
  Administered 2021-03-25 – 2021-03-29 (×8): 20 mg
  Filled 2021-03-25 (×9): qty 1

## 2021-03-25 MED ORDER — POLYETHYLENE GLYCOL 3350 17 G PO PACK
17.0000 g | PACK | Freq: Every day | ORAL | Status: DC
  Administered 2021-03-25: 17 g
  Filled 2021-03-25 (×4): qty 1

## 2021-03-25 MED ORDER — SENNOSIDES-DOCUSATE SODIUM 8.6-50 MG PO TABS
1.0000 | ORAL_TABLET | Freq: Two times a day (BID) | ORAL | Status: DC
  Administered 2021-03-25 – 2021-03-28 (×5): 1
  Filled 2021-03-25 (×10): qty 1

## 2021-03-25 MED ORDER — LOSARTAN POTASSIUM 50 MG PO TABS
25.0000 mg | ORAL_TABLET | Freq: Every day | ORAL | Status: DC
  Administered 2021-03-25: 25 mg via ORAL
  Filled 2021-03-25: qty 1

## 2021-03-25 MED ORDER — DEXTROSE-NACL 5-0.45 % IV SOLN: INTRAVENOUS | Status: DC

## 2021-03-25 MED ORDER — DEXAMETHASONE SODIUM PHOSPHATE 10 MG/ML IJ SOLN
6.0000 mg | Freq: Two times a day (BID) | INTRAMUSCULAR | Status: DC
  Administered 2021-03-25 – 2021-03-28 (×7): 6 mg via INTRAVENOUS
  Filled 2021-03-25 (×9): qty 1

## 2021-03-25 MED ORDER — OSMOLITE 1.2 CAL PO LIQD
1000.0000 mL | ORAL | Status: DC
  Administered 2021-03-25: 1000 mL
  Filled 2021-03-25 (×6): qty 1000

## 2021-03-25 MED ORDER — VITAL HIGH PROTEIN PO LIQD: 1000.0000 mL | ORAL | Status: DC

## 2021-03-25 MED ORDER — AMANTADINE HCL 50 MG/5ML PO SOLN
100.0000 mg | Freq: Two times a day (BID) | ORAL | Status: DC
  Administered 2021-03-25 – 2021-03-28 (×7): 100 mg
  Filled 2021-03-25 (×10): qty 10

## 2021-03-25 MED ORDER — LOSARTAN POTASSIUM 25 MG PO TABS
25.0000 mg | ORAL_TABLET | Freq: Every day | ORAL | Status: DC
  Administered 2021-03-26 – 2021-03-29 (×4): 25 mg
  Filled 2021-03-25 (×4): qty 1

## 2021-03-25 MED ORDER — AMLODIPINE BESYLATE 10 MG PO TABS
10.0000 mg | ORAL_TABLET | Freq: Every day | ORAL | Status: DC
  Administered 2021-03-25 – 2021-03-29 (×5): 10 mg
  Filled 2021-03-25 (×5): qty 1

## 2021-03-25 MED ORDER — TRAZODONE HCL 100 MG PO TABS
100.0000 mg | ORAL_TABLET | Freq: Every day | ORAL | Status: DC
  Administered 2021-03-25 – 2021-03-28 (×4): 100 mg
  Filled 2021-03-25 (×4): qty 1

## 2021-03-25 MED ORDER — PANTOPRAZOLE SODIUM 40 MG PO PACK
40.0000 mg | PACK | Freq: Every day | ORAL | Status: DC
  Administered 2021-03-25 – 2021-03-28 (×4): 40 mg
  Filled 2021-03-25 (×5): qty 20

## 2021-03-25 MED ORDER — PROSOURCE TF PO LIQD
45.0000 mL | Freq: Two times a day (BID) | ORAL | Status: DC
  Administered 2021-03-25 – 2021-03-29 (×9): 45 mL
  Filled 2021-03-25 (×9): qty 45

## 2021-03-25 MED ORDER — HYDROCHLOROTHIAZIDE 25 MG PO TABS
25.0000 mg | ORAL_TABLET | Freq: Every day | ORAL | Status: DC
  Administered 2021-03-25 – 2021-03-28 (×4): 25 mg
  Filled 2021-03-25 (×5): qty 1

## 2021-03-25 NOTE — Progress Notes (Signed)
NAME:  Paula Dickson, MRN:  [id number], DOB:  1966-05-09, LOS: 7 ADMISSION DATE:  03/18/2021, CONSULTATION DATE:  03/18/21 REFERRING MD:  Dr. Ralene Bathe, ER, CHIEF COMPLAINT:  Overdose   History of Present Illness:  55 yo female found at home by family member unresponsive on the floor.  EMS called and patient found with gurgling respirations and surrounded by pill bottles.  Required intubation for airway protection.  Family did not come to ER with patient.  Last known normal time was in evening of 03/17/21.  Had recent admission for hypernatremia and AKI.  Has hx of medical non-compliance.     Pertinent  Medical History  Hyperthyroidism, PAF, Bipolar, Paranoid schizophrenia, HTN  Significant Hospital Events: Including procedures, antibiotic start and stop dates in addition to other pertinent events     4/22 Admit; CT head >> no acute findings  4/25 Trial Vent weaning unsuccessful secondary to agitation  4/26 extubated but later re-intubated for stridor  4/28 extubated but did not pass nursing bedside swallow screen; SLP consulted  Interim History / Subjective:  Extubated yesterday 4/28 and subsequently did not pass nursing bedside swallow screen. SLP consulted and patient was able to swallow thicker liquids. Continued to have episodes of agitation and restlessness accompanied by HBPs to 190s/100s and started on amlodipine, HCTZ and PRN labetalol.   No overnight events.   UOP 2.75 L  Objective   Blood pressure (!) 160/107, pulse 86, temperature 99.4 F (37.4 C), temperature source Oral, resp. rate (!) 28, height 5\' 3"  (1.6 m), weight 60 kg, SpO2 94 %.        Intake/Output Summary (Last 24 hours) at 03/25/2021 0912 Last data filed at 03/25/2021 0800 Gross per 24 hour  Intake 1755.82 ml  Output 2750 ml  Net -994.18 ml   Filed Weights   03/18/21 1830  Weight: 60 kg   General: Adult female, resting in bed, in NAD. Neuro: Alert. Oriented to person only when given options of names,  oriented to place when given option of places, oriented to year when given option of years but not oriented to month or day.  EOMI. PERRL. Voluntarily moves all extremities. HEENT: [place]/AT. Sclerae anicteric. Cardiovascular: RRR, no M/R/G.  Lungs: Respirations even and unlabored. CTAB. Audible stridor. Abdomen: BS x 4, soft, NT/ND.  Musculoskeletal: No gross deformities, no edema.  Skin: Intact, warm, no rashes.  Resolved Hospital Problem list     Assessment & Plan:   Intentional Benzodiazepine and Tegretol overdose with hx of Bipolar and Paranoid schizophrenia. - Continue usual psychiatric medications per psychiatry - Will need hospitalization to [facility] psych once medically cleared per psychiatry  - Will need to contact CSW for placement to Inpatient Psychiatry once medically cleared  Hx of Hypertension Continued to have episodes of BP rising to 190s/100s. Started on amlodipine 10 mg and HCTZ 25 mg 4/28. BP today 160s/100s. Will start an ARB in the setting of continued HTN and stridor. With this degree of HTN and continued stridor, would be more appropriate to continue medical stabilization before transfer to [facility] psychiatry. - Continue amlodipine and HCTZ - Start losartan 25 mg daily - Continue IV labetalol PRN - Transfer to hospitalist service for continued management of elevated BPs and stridor  Compromised airway in setting of overdose.   S/P extubation 7/84 complicated by stridor requiring re-intubation and initiation of decadron. S/P extubation 4/28 Stridor - Restart decadron - Continue diet per SLP recommendations  Hx of hyperthyroidism - Continue propranolol 20mg  and Methimazole  10mg  BID.  Best practice (right click and "Reselect all SmartList Selections" daily)  Diet:  Thick Liquids Pain/Anxiety/Delirium protocol (if indicated): Yes (RASS goal 0) VAP protocol (if indicated): Yes DVT prophylaxis: lovenox GI prophylaxis: PPI Glucose control:  SSI Yes Central venous  access:  N/A Arterial line:  N/A Foley:  N/A Mobility:  bed rest  PT consulted: N/A Last date of multidisciplinary goals of care discussion [Pending] Code Status:  full code Disposition: ICU  CRITICAL CARE Performed by: Lowella Curb

## 2021-03-25 NOTE — Consult Note (Signed)
Psychiatry Consult Note  Paula Dickson is a 55 year old female with past history of schizophrenia, bipolar, hypothyroidism, hypertension found unresponsive on the floor.  EMS was called and the patient found gurgling respirations and surrounded by pill bottles.(Benzodiazepine and Tegretol).  patient required intubation for airway protection.  Patient has recent admission for hypernatremia and AKI and history of medical noncompliance.   Patient continues to meet inpatient criteria. She was seen and assessed, however she was mute today. She was able to communicate by means of shaking and nodding her head. She nods to suicidal and hallucinations. She reports compliance with medication.  Patient well known to this provider as she was followed closely during her previous admission at [facility] She did take an extended amount of time to respond to her psychotropic medications and stabilize. At this time she remains in ICU, and has not yet been medically cleared.   - Will continue  Current medication regimen. She has previously tried higher doses of Haldol, however developed EPS and akathisia. Will continue at the current doses as she has shown improvement at current dose.  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  -Continue to recommend inpatient admission once medically stable.

## 2021-03-25 NOTE — Progress Notes (Signed)
Patient refusing CBG 

## 2021-03-25 NOTE — Progress Notes (Addendum)
Nutrition Follow-up  DOCUMENTATION CODES:   Non-severe (moderate) malnutrition in context of social or environmental circumstances  INTERVENTION:   Initiate tube feeding via Cortrak tube: Osmolite 1.2 at 60 ml/h (1440 ml per day) Prosource TF 45 ml BID  Provides 1808 kcal, 110 gm protein, 1167 ml free water daily   NUTRITION DIAGNOSIS:   Moderate Malnutrition related to social / environmental circumstances as evidenced by severe muscle depletion,moderate muscle depletion,moderate fat depletion. Ongoing.   GOAL:   Patient will meet greater than or equal to 90% of their needs Met with TF.   MONITOR:   PO intake,TF tolerance,Diet advancement  REASON FOR ASSESSMENT:   Ventilator,Consult Enteral/tube feeding initiation and management  ASSESSMENT:   55 year old female who presented to the ED on 4/22 after being found unresponsive by family surrounded by multiple pill bottles. PMH of hypothyroidism, PAF, bipolar disorder, schizophrenia, HTN. Pt intubated in the ED and admitted with intentional overdose.  Pt discussed during ICU rounds and with RN.  Pt unable to answer any questions at this time. Pt did make eye contact but did not verbalize. Pt able to stick out her tongue on command.  Decision made to place cortrak as pt has refused to take meds and eat. SLP following and allowing nectar thickened clear liquids   4/23 TF initiated  4/28 extubated 4/29 cortrak placed; tip gastric     Medications reviewed and include: decadron, SSI, protonix, miralax, senokot-s Labs reviewed: Na 149 CBG's: 76-136  NUTRITION - FOCUSED PHYSICAL EXAM:  Flowsheet Row Most Recent Value  Orbital Region Moderate depletion  Upper Arm Region Unable to assess  Thoracic and Lumbar Region Moderate depletion  Buccal Region Moderate depletion  Temple Region Moderate depletion  Clavicle Bone Region Moderate depletion  Scapular Bone Region Moderate depletion  Dorsal Hand Unable to assess   Patellar Region Severe depletion  Anterior Thigh Region Severe depletion  Posterior Calf Region Severe depletion  Edema (RD Assessment) None  Hair Reviewed  Eyes Reviewed  Mouth Reviewed  Skin Reviewed  Nails Unable to assess       Diet Order:   Diet Order            Diet clear liquid Room service appropriate? No; Fluid consistency: Nectar Thick  Diet effective now                 EDUCATION NEEDS:   No education needs have been identified at this time  Skin:  Skin Assessment: Reviewed RN Assessment  Last BM:  unknown  Height:   Ht Readings from Last 1 Encounters:  03/22/21 _0  (1.6 m)    Weight:   Wt Readings from Last 1 Encounters:  03/18/21 60 kg    BMI:  Body mass index is 23.43 kg/m.  Estimated Nutritional Needs:   Kcal:  1700-1900  Protein:  90-110 grams  Fluid:  >1.7 L/day  Lockie Pares., RD, LDN, CNSC See AMiON for contact information

## 2021-03-25 NOTE — Procedures (Signed)
Cortrak  Person Inserting Tube:  Ahyana Skillin, RD Tube Type:  Cortrak - 43 inches Tube Location:  Right nare Initial Placement:  Stomach Secured by: Bridle Technique Used to Measure Tube Placement:  Documented cm marking at nare/ corner of mouth Cortrak Secured At:  67 cm   No x-ray is required. RN may begin using tube.   If the tube becomes dislodged please keep the tube and contact the Cortrak team at [email or web address] (password [email or web address]) for replacement.  If after hours and replacement cannot be delayed, place a NG tube and confirm placement with an abdominal x-ray.    Kadasia Kassing RD, LDN Clinical Nutrition Pager listed in AMION    

## 2021-03-25 NOTE — TOC Initial Note (Signed)
Transition of Care [facility]) - Initial/Assessment Note    Patient Details  Name: Paula Dickson MRN: [id number] Date of Birth: May 08, 1966  [facility] CM/SW Contact:    Bethann Berkshire, [place] Phone Number: 03/25/2021, 1:28 PM  Clinical Narrative:                  Pt is recommended inpatient psych placement. Pt is not currently medically stable. TOC will follow for inpatient psych placement once medically stable.   Expected Discharge Plan: Psychiatric Hospital Barriers to Discharge: Continued Medical Work up   Patient Goals and CMS Choice        Expected Discharge Plan and Services Expected Discharge Plan: [facility]                                              Prior Living Arrangements/Services                       Activities of Daily Living      Permission Sought/Granted                  Emotional Assessment         Alcohol / Substance Use: Not Applicable Psych Involvement: Yes (comment)  Admission diagnosis:  Respiratory failure ([place]) [J96.90] Overdose [T50.901A] Patient Active Problem List   Diagnosis Date Noted  . Encounter for intubation   . Encephalopathy acute   . Respiratory failure ([place])   . Schizophrenia ([place])   . Overdose 03/18/2021   PCP:  Pcp, No Pharmacy:  No Pharmacies Listed    Social Determinants of Health (SDOH) Interventions    Readmission Risk Interventions No flowsheet data found.

## 2021-03-25 NOTE — Progress Notes (Signed)
[facility] PCCM  Patient is refusing to eat or swallow On exam: awake, alert, follows commands; no audible stridor on repeat exam  Suspect her refusal to eat/swallow is related to underlying poorly controlled schizophrenia/bipolar; will place cor trak so she can get psych meds and nutrition  Roselie Awkward, MD [place] PCCM Pager: [phone] Cell: [phone] If no response, please call [phone] until 7pm After 7:00 pm call [place]  [phone]

## 2021-03-25 NOTE — Progress Notes (Addendum)
  Speech Language Pathology Treatment: Dysphagia  Patient Details Name: Paula Dickson MRN: [id number] DOB: 16-Dec-1965 Today's Date: 03/25/2021 Time: 3267-1245 SLP Time Calculation (min) (ACUTE ONLY): 23.25 min  Assessment / Plan / Recommendation Clinical Impression  Pt was seen for dysphagia treatment. Pt's nurse denied any s/sx of aspiration with p.o. intake, but stated that the pt continues to refuse pills and inconsistently accepts liquids. Verbal output was limited to "yeah" and "you're beautiful", but a moderately hoarse vocal quality was noted, suggesting vocal fold insufficiency and increasing aspiration risk. Pt demonstrated coughing with thin liquids via straw, but no overt s/sx of aspiration were noted with limited sips of thin liquids via cup and consecutive swallows of nectar thick liquids via straw (with a pill given by RN). Despite encouragement from this SLP and RN, pt refused additional boluses of thin liquids and all solids which were offered. It is unlikely that this pt would be cooperative during an instrumental assessment it will therefore be deferred at this time. A clear liquid (nectar thick) diet will be initiated with allowance of thin water via cup between meals following oral care. Considering her presentation, adequacy of intake is questioned and pt's RN has spoken to the MD regarding possible Cortrak placement. SLP will follow to further assess swallow function and for diet advancement as clinically indicated.    HPI HPI: 55 yo female found at home by family member unresponsive on the floor.  EMS called and patient found with gurgling respirations and surrounded by pill bottles.  Required intubation for airway protection.  Family did not come to ER with patient.  Last known normal time was in evening of 03/17/21.  Had recent admission for hypernatremia and AKI.  Has hx of medical non-compliance. Intubated 4/22-4/26, reintubated 4/26-4/28. Failed [facility].      SLP Plan  Goals  updated       Recommendations  Diet recommendations: Nectar-thick liquid (clear liquids); pt may have thin water via cup following oral care Liquids provided via: Cup;Straw Medication Administration: Whole meds with liquid Supervision: Staff to assist with self feeding Compensations: Slow rate;Small sips/bites;Minimize environmental distractions Postural Changes and/or Swallow Maneuvers: Seated upright 90 degrees                Follow up Recommendations: 24 hour supervision/assistance SLP Visit Diagnosis: Dysphagia, oropharyngeal phase (R13.12) Plan: Goals updated       Paula Dickson, [place], [place] Office number [phone] Pager [place] 03/25/2021, 10:51 AM

## 2021-03-25 NOTE — Progress Notes (Signed)
E-Link notified that patient refused medications.

## 2021-03-26 ENCOUNTER — Encounter (HOSPITAL_COMMUNITY): Payer: Self-pay | Admitting: Pulmonary Disease

## 2021-03-26 DIAGNOSIS — E440 Moderate protein-calorie malnutrition: Secondary | ICD-10-CM | POA: Insufficient documentation

## 2021-03-26 DIAGNOSIS — Z01818 Encounter for other preprocedural examination: Secondary | ICD-10-CM | POA: Diagnosis not present

## 2021-03-26 LAB — MAGNESIUM
Magnesium: 1.9 mg/dL (ref 1.7–2.4)
Magnesium: 1.8 mg/dL (ref 1.7–2.4)

## 2021-03-26 LAB — GLUCOSE, CAPILLARY
Glucose-Capillary: 142 mg/dL — ABNORMAL HIGH (ref 70–99)
Glucose-Capillary: 150 mg/dL — ABNORMAL HIGH (ref 70–99)
Glucose-Capillary: 159 mg/dL — ABNORMAL HIGH (ref 70–99)
Glucose-Capillary: 192 mg/dL — ABNORMAL HIGH (ref 70–99)
Glucose-Capillary: 249 mg/dL — ABNORMAL HIGH (ref 70–99)

## 2021-03-26 LAB — PHOSPHORUS
Phosphorus: 2.3 mg/dL — ABNORMAL LOW (ref 2.5–4.6)
Phosphorus: 3.6 mg/dL (ref 2.5–4.6)

## 2021-03-26 MED ORDER — POTASSIUM PHOSPHATES 15 MMOLE/5ML IV SOLN
30.0000 mmol | Freq: Once | INTRAVENOUS | Status: AC
  Administered 2021-03-26: 30 mmol via INTRAVENOUS
  Filled 2021-03-26: qty 10

## 2021-03-26 NOTE — Progress Notes (Signed)
When asking patient if she knew why she came to the ED she said stated she tried to take too much medicine because nobody cares. Crying when brother came to visit. Patient sat up and was requesting a lunch tray. Lunch ordered. Removed restraints and mitts. Sitter in room.  Patient drank Sprite and said she will not pull out any lines and is aware if she does we will have to replace the restraints. Patient speaking to staff.

## 2021-03-26 NOTE — Progress Notes (Signed)
PROGRESS NOTE    Paula Dickson  UMP:[id number] DOB: 11/03/1966 DOA: 03/18/2021 PCP: Pcp, No   Brief Narrative:  55 yo female with history of bipolar disorder, schizophrenia, hypothyroidism and hypertension found at home by family member unresponsive on the floor.  EMS called and patient found with gurgling respirations and surrounded by pill bottles.  Required intubation for airway protection.  Family did not come to ER with patient.  Last known normal time was in evening of 03/17/21.  Had recent admission for hypernatremia and AKI.  Has hx of medical non-compliance.  Patient was intubated in the ED for airway protection.  She was extubated on 4/00/8676 complicated by stridor requiring reintubation and initiation of Decadron.  She was subsequently extubated again on 03/24/2021 and did not pass nursing bedside swallow screen. SLP consulted and patient was able to swallow thicker liquids. Continued to have episodes of agitation and restlessness accompanied by HBPs to 190s/100s and started on amlodipine, HCTZ and PRN labetalol.   Evaluated by psychiatry who recommended inpatient psychiatry admission.  She refused to eat. cortrak was placed and she was started on tube feedings.  Subsequently transferred under [facility] care on 03/26/2021.  Assessment & Plan:   Active Problems:   Overdose   Respiratory failure (HCC)   Schizophrenia ([place])   Encounter for intubation   Encephalopathy acute   Malnutrition of moderate degree  Intentional overdose with benzodiazepine and Tegretol with history of bipolar disorder, paranoid schizophrenia: Patient required intubation for airway protection finally was extubated on 03/24/2021.  Evaluated by psychiatry.  They recommend inpatient psych admission pending medical clearance.  Patient is medically cleared per my evaluation today.  I have notified TOC who is working on finding a bed.  Continue Cogentin and Haldol  Refusal to eat/dysphagia: Patient herself today requested to  allow her to eat regular food.  Will start on regular diet.  If able to take enough p.o. intake, will pull cortrak tomorrow and stop tube feedings.  Hypertension: Blood pressure very well controlled.  Continue amlodipine, losartan and hydrochlorothiazide and as needed labetalol.  History of hypothyroidism: Continue propranolol and methimazole.  DVT prophylaxis: enoxaparin (LOVENOX) injection 40 mg Start: 03/19/21 1000 Place and maintain sequential compression device Start: 03/18/21 1850   Code Status: Full Code  Family Communication:  None present at bedside.   Status is: Inpatient  Remains inpatient appropriate because:Unsafe d/c plan   Dispo: The patient is from: Home              Anticipated d/c is to: [facility]              Patient currently is medically stable to d/c.   Difficult to place patient No        Estimated body mass index is 23.04 kg/m as calculated from the following:   Height as of this encounter: 5\' 3"  (1.6 m).   Weight as of this encounter: 59 kg.      Nutritional status:  Nutrition Problem: Moderate Malnutrition Etiology: social / environmental circumstances   Signs/Symptoms: severe muscle depletion,moderate muscle depletion,moderate fat depletion   Interventions: Prostat,Tube feeding    Consultants:   Psychiatry  Procedures:   None  Antimicrobials:  Anti-infectives (From admission, onward)   None         Subjective: Seen and examined.  Patient was sleepy but easily arousable.  Once awake, she was able to communicate well.  Denied any complaint.  She was alert and oriented.  Objective: Vitals:   03/26/21  [id number] 03/26/21 0500 03/26/21 0759 03/26/21 1221  BP: 135/73  139/84 125/78  Pulse: 84  80 85  Resp: 20  20 19   Temp: 98.3 F (36.8 C)   99 F (37.2 C)  TempSrc: Axillary   Oral  SpO2: 95%  97% 98%  Weight:  59 kg    Height:        Intake/Output Summary (Last 24 hours) at 03/26/2021 1429 Last data filed at 03/26/2021  1300 Gross per 24 hour  Intake 3527.5 ml  Output 0 ml  Net 3527.5 ml   Filed Weights   03/18/21 1830 03/26/21 0500  Weight: 60 kg 59 kg    Examination:  General exam: Appears calm and comfortable  Respiratory system: Clear to auscultation. Respiratory effort normal. Cardiovascular system: S1 & S2 heard, RRR. No JVD, murmurs, rubs, gallops or clicks. No pedal edema. Gastrointestinal system: Abdomen is nondistended, soft and nontender. No organomegaly or masses felt. Normal bowel sounds heard. Central nervous system: Alert and oriented. No focal neurological deficits. Extremities: Symmetric 5 x 5 power. Skin: No rashes, lesions or ulcers   Data Reviewed: I have personally reviewed following labs and imaging studies  CBC: Recent Labs  Lab 03/20/21 0000 03/23/21 0437 03/24/21 0305  WBC 11.0* 7.8 9.8  HGB 12.3 11.3* 10.3*  HCT 37.8 35.9* 32.5*  MCV 86.9 90.0 88.3  PLT 235 245 [id number]   Basic Metabolic Panel: Recent Labs  Lab 03/20/21 0000 03/21/21 0823 03/23/21 0437 03/24/21 0305 03/25/21 1418 03/26/21 0557  NA 142 149* 149* 149*  --   --   K 4.6 4.1 4.6 4.1  --   --   CL 109 114* 113* 114*  --   --   CO2 25 26 26 28   --   --   GLUCOSE 137* 120* 180* 114*  --   --   BUN 14 18 25* 28*  --   --   CREATININE 1.43* 1.04* 0.95 0.86  --   --   CALCIUM 9.7 10.0 10.0 9.9  --   --   MG 3.2* 2.2 2.1 2.2 2.0 1.9  PHOS 1.7* 4.0 3.1 2.9 2.4* 2.3*   GFR: Estimated Creatinine Clearance: 59.7 mL/min (by C-G formula based on SCr of 0.86 mg/dL). Liver Function Tests: No results for input(s): AST, ALT, ALKPHOS, BILITOT, PROT, ALBUMIN in the last 168 hours. No results for input(s): LIPASE, AMYLASE in the last 168 hours. No results for input(s): AMMONIA in the last 168 hours. Coagulation Profile: No results for input(s): INR, PROTIME in the last 168 hours. Cardiac Enzymes: No results for input(s): CKTOTAL, CKMB, CKMBINDEX, TROPONINI in the last 168 hours. BNP (last 3 results) No  results for input(s): PROBNP in the last 8760 hours. HbA1C: No results for input(s): HGBA1C in the last 72 hours. CBG: Recent Labs  Lab 03/25/21 1143 03/25/21 1541 03/25/21 1948 03/26/21 0442 03/26/21 0755  GLUCAP 136* 191* 162* 249* 192*   Lipid Profile: No results for input(s): CHOL, HDL, LDLCALC, TRIG, CHOLHDL, LDLDIRECT in the last 72 hours. Thyroid Function Tests: No results for input(s): TSH, T4TOTAL, FREET4, T3FREE, THYROIDAB in the last 72 hours. Anemia Panel: No results for input(s): VITAMINB12, FOLATE, FERRITIN, TIBC, IRON, RETICCTPCT in the last 72 hours. Sepsis Labs: No results for input(s): PROCALCITON, LATICACIDVEN in the last 168 hours.  Recent Results (from the past 240 hour(s))  SARS CORONAVIRUS 2 (TAT 6-24 HRS) Nasopharyngeal Nasopharyngeal Swab     Status: None   Collection Time: 03/18/21  6:10  PM   Specimen: Nasopharyngeal Swab  Result Value Ref Range Status   SARS Coronavirus 2 NEGATIVE NEGATIVE Final    Comment: (NOTE) SARS-CoV-2 target nucleic acids are NOT DETECTED.  The SARS-CoV-2 RNA is generally detectable in upper and lower respiratory specimens during the acute phase of infection. Negative results do not preclude SARS-CoV-2 infection, do not rule out co-infections with other pathogens, and should not be used as the sole basis for treatment or other patient management decisions. Negative results must be combined with clinical observations, patient history, and epidemiological information. The expected result is Negative.  Fact Sheet for Patients: SugarRoll.be  Fact Sheet for Healthcare Providers: https://www.woods-mathews.com/  This test is not yet approved or cleared by the [place] FDA and  has been authorized for detection and/or diagnosis of SARS-CoV-2 by FDA under an Emergency Use Authorization (EUA). This EUA will remain  in effect (meaning this test can be used) for the duration of  the COVID-19 declaration under Se ction 564(b)(1) of the Act, 21 U.S.C. section 360bbb-3(b)(1), unless the authorization is terminated or revoked sooner.  Performed at [facility], [place] [address] [place], [place] [address]   MRSA PCR Screening     Status: None   Collection Time: 03/18/21  8:25 PM   Specimen: Nasal Mucosa; Nasopharyngeal  Result Value Ref Range Status   MRSA by PCR NEGATIVE NEGATIVE Final    Comment:        The GeneXpert MRSA Assay (FDA approved for NASAL specimens only), is one component of a comprehensive MRSA colonization surveillance program. It is not intended to diagnose MRSA infection nor to guide or monitor treatment for MRSA infections. Performed at [facility], [place] [address], [place], [place] [address]       Radiology Studies: No results found.  Scheduled Meds: . amantadine  100 mg Per Tube BID  . amLODipine  10 mg Per Tube Daily  . benztropine  1 mg Per Tube QHS  . Chlorhexidine Gluconate Cloth  6 each Topical Q0600  . dexamethasone (DECADRON) injection  6 mg Intravenous Q12H  . enoxaparin (LOVENOX) injection  40 mg Subcutaneous Q24H  . feeding supplement (PROSource TF)  45 mL Per Tube BID  . haloperidol  2 mg Per Tube Daily  . haloperidol  5 mg Per Tube QHS  . hydrochlorothiazide  25 mg Per Tube Daily  . insulin aspart  0-15 Units Subcutaneous Q4H  . LORazepam  2 mg Intravenous Once  . losartan  25 mg Per Tube Daily  . mouth rinse  15 mL Mouth Rinse BID  . methimazole  10 mg Per Tube BID  . pantoprazole sodium  40 mg Per Tube Daily  . polyethylene glycol  17 g Per Tube Daily  . propranolol  20 mg Per Tube BID  . senna-docusate  1 tablet Per Tube BID  . traZODone  100 mg Per Tube QHS   Continuous Infusions: . dextrose 5 % and 0.45% NaCl 75 mL/hr at 03/25/21 1600  . feeding supplement (OSMOLITE 1.2 CAL) 60 mL/hr at 03/25/21 1804  . potassium PHOSPHATE IVPB (in mmol) 30 mmol (03/26/21 0925)     LOS: 8 days   Time  spent: 35 minutes   Darliss Cheney, MD [facility]  03/26/2021, 2:29 PM   How to contact the [facility] Attending or Consulting provider [place] or covering provider during after hours [place], for this patient?  1. Check the care team in [facility] and look  for a) attending/consulting [place] provider listed and b) the [facility] team listed. Page or secure chat [phone]. 2. Log into [email or web address] and use 's universal password to access. If you do not have the password, please contact the hospital operator. 3. Locate the [facility] provider you are looking for under [facility] and page to a number that you can be directly reached. 4. If you still have difficulty reaching the provider, please page the [facility] (Director on Call) for the Hospitalists listed on amion for assistance.

## 2021-03-26 NOTE — TOC Progression Note (Signed)
Transition of Care [facility]) - Progression Note    Patient Details  Name: GAYATRI TEASDALE MRN: [id number] Date of Birth: [birth year]  Transition of Care [facility]) CM/SW [place], [place] Phone Number: 03/26/2021, 12:24 PM  Clinical Narrative:    CSW was updated that pt was medically stable to transfer to Psych inpatient. CSW sent referral to [facility] and confirmed it was received. MD stated pt still had Cortrak, [facility] is reviewing. If unable to accept, they will fax pt out further. SW will continue to follow for DC needs.   Expected Discharge Plan: Psychiatric Hospital Barriers to Discharge: Continued Medical Work up  Expected Discharge Plan and Services Expected Discharge Plan: [facility]                                               Social Determinants of Health (SDOH) Interventions    Readmission Risk Interventions No flowsheet data found.

## 2021-03-27 ENCOUNTER — Other Ambulatory Visit: Payer: Self-pay

## 2021-03-27 DIAGNOSIS — G9340 Encephalopathy, unspecified: Secondary | ICD-10-CM

## 2021-03-27 LAB — GLUCOSE, CAPILLARY
Glucose-Capillary: 185 mg/dL — ABNORMAL HIGH (ref 70–99)
Glucose-Capillary: 151 mg/dL — ABNORMAL HIGH (ref 70–99)
Glucose-Capillary: 168 mg/dL — ABNORMAL HIGH (ref 70–99)
Glucose-Capillary: 179 mg/dL — ABNORMAL HIGH (ref 70–99)
Glucose-Capillary: 170 mg/dL — ABNORMAL HIGH (ref 70–99)
Glucose-Capillary: 145 mg/dL — ABNORMAL HIGH (ref 70–99)

## 2021-03-27 NOTE — Progress Notes (Signed)
PROGRESS NOTE    Paula Dickson  YKD:[id number] DOB: Jun 08, 1966 DOA: 03/18/2021 PCP: Pcp, No   Brief Narrative:  55 yo female with history of bipolar disorder, schizophrenia, hypothyroidism and hypertension found at home by family member unresponsive on the floor.  EMS called and patient found with gurgling respirations and surrounded by pill bottles.  Required intubation for airway protection.  Family did not come to ER with patient.  Last known normal time was in evening of 03/17/21.  Had recent admission for hypernatremia and AKI.  Has hx of medical non-compliance.  Patient was intubated in the ED for airway protection.  She was extubated on 3/97/6734 complicated by stridor requiring reintubation and initiation of Decadron.  She was subsequently extubated again on 03/24/2021 and did not pass nursing bedside swallow screen. SLP consulted and patient was able to swallow thicker liquids. Continued to have episodes of agitation and restlessness accompanied by HBPs to 190s/100s and started on amlodipine, HCTZ and PRN labetalol.   Evaluated by psychiatry who recommended inpatient psychiatry admission.  She refused to eat. cortrak was placed and she was started on tube feedings.  Subsequently transferred under [facility] care on 03/26/2021.  Assessment & Plan:   Active Problems:   Overdose   Respiratory failure (HCC)   Schizophrenia ([place])   Encounter for intubation   Encephalopathy acute   Malnutrition of moderate degree  Intentional overdose with benzodiazepine and Tegretol with history of bipolar disorder, paranoid schizophrenia: Patient required intubation for airway protection finally was extubated on 03/24/2021.  Evaluated by psychiatry.  They recommend inpatient psych admission pending medical clearance.  Patient is medically cleared for transfer to behavioral health.  TOC aware and working with behavioral health for bed.  Refusal to eat/dysphagia: Patient has started to eat since yesterday.  Consulted  dietitian to do calorie count and to find out if and when we can remove her cortrak, patient does not feel comfortable eating with that.  Hypertension: Blood pressure very well controlled.  Continue amlodipine, losartan and hydrochlorothiazide and as needed labetalol.  History of hypothyroidism: Continue propranolol and methimazole.  DVT prophylaxis: enoxaparin (LOVENOX) injection 40 mg Start: 03/19/21 1000 Place and maintain sequential compression device Start: 03/18/21 1850   Code Status: Full Code  Family Communication:  None present at bedside.   Status is: Inpatient  Remains inpatient appropriate because:Unsafe d/c plan   Dispo: The patient is from: Home              Anticipated d/c is to: [facility]              Patient currently is medically stable to d/c.   Difficult to place patient No        Estimated body mass index is 23.04 kg/m as calculated from the following:   Height as of this encounter: 5\' 3"  (1.6 m).   Weight as of this encounter: 59 kg.      Nutritional status:  Nutrition Problem: Moderate Malnutrition Etiology: social / environmental circumstances   Signs/Symptoms: severe muscle depletion,moderate muscle depletion,moderate fat depletion   Interventions: Prostat,Tube feeding    Consultants:   Psychiatry  Procedures:   None  Antimicrobials:  Anti-infectives (From admission, onward)   None         Subjective: Seen and examined.  No complaints.  Fully alert and oriented.  Very pleasant.  Objective: Vitals:   03/26/21 2342 03/27/21 0404 03/27/21 0830 03/27/21 1214  BP: 128/79 (!) 142/67 (!) 144/92 113/72  Pulse: 76 98 92  79  Resp: 20 20 20 20   Temp: 99 F (37.2 C) 98.6 F (37 C) 98.7 F (37.1 C) 98.6 F (37 C)  TempSrc: Axillary Oral Oral Oral  SpO2: 98% 100% 99% 100%  Weight:      Height:        Intake/Output Summary (Last 24 hours) at 03/27/2021 1311 Last data filed at 03/27/2021 0959 Gross per 24 hour  Intake 320 ml   Output 1350 ml  Net -1030 ml   Filed Weights   03/18/21 1830 03/26/21 0500  Weight: 60 kg 59 kg    Examination:  General exam: Appears calm and comfortable  Respiratory system: Clear to auscultation. Respiratory effort normal. Cardiovascular system: S1 & S2 heard, RRR. No JVD, murmurs, rubs, gallops or clicks. No pedal edema. Gastrointestinal system: Abdomen is nondistended, soft and nontender. No organomegaly or masses felt. Normal bowel sounds heard. Central nervous system: Alert and oriented. No focal neurological deficits. Extremities: Symmetric 5 x 5 power. Skin: No rashes, lesions or ulcers.     Data Reviewed: I have personally reviewed following labs and imaging studies  CBC: Recent Labs  Lab 03/23/21 0437 03/24/21 0305  WBC 7.8 9.8  HGB 11.3* 10.3*  HCT 35.9* 32.5*  MCV 90.0 88.3  PLT 245 710   Basic Metabolic Panel: Recent Labs  Lab 03/21/21 0823 03/23/21 0437 03/24/21 0305 03/25/21 1418 03/26/21 0557 03/26/21 1653  NA 149* 149* 149*  --   --   --   K 4.1 4.6 4.1  --   --   --   CL 114* 113* 114*  --   --   --   CO2 26 26 28   --   --   --   GLUCOSE 120* 180* 114*  --   --   --   BUN 18 25* 28*  --   --   --   CREATININE 1.04* 0.95 0.86  --   --   --   CALCIUM 10.0 10.0 9.9  --   --   --   MG 2.2 2.1 2.2 2.0 1.9 1.8  PHOS 4.0 3.1 2.9 2.4* 2.3* 3.6   GFR: Estimated Creatinine Clearance: 59.7 mL/min (by C-G formula based on SCr of 0.86 mg/dL). Liver Function Tests: No results for input(s): AST, ALT, ALKPHOS, BILITOT, PROT, ALBUMIN in the last 168 hours. No results for input(s): LIPASE, AMYLASE in the last 168 hours. No results for input(s): AMMONIA in the last 168 hours. Coagulation Profile: No results for input(s): INR, PROTIME in the last 168 hours. Cardiac Enzymes: No results for input(s): CKTOTAL, CKMB, CKMBINDEX, TROPONINI in the last 168 hours. BNP (last 3 results) No results for input(s): PROBNP in the last 8760 hours. HbA1C: No results  for input(s): HGBA1C in the last 72 hours. CBG: Recent Labs  Lab 03/26/21 2116 03/26/21 2338 03/27/21 0403 03/27/21 0853 03/27/21 1209  GLUCAP 159* 150* 185* 151* 168*   Lipid Profile: No results for input(s): CHOL, HDL, LDLCALC, TRIG, CHOLHDL, LDLDIRECT in the last 72 hours. Thyroid Function Tests: No results for input(s): TSH, T4TOTAL, FREET4, T3FREE, THYROIDAB in the last 72 hours. Anemia Panel: No results for input(s): VITAMINB12, FOLATE, FERRITIN, TIBC, IRON, RETICCTPCT in the last 72 hours. Sepsis Labs: No results for input(s): PROCALCITON, LATICACIDVEN in the last 168 hours.  Recent Results (from the past 240 hour(s))  SARS CORONAVIRUS 2 (TAT 6-24 HRS) Nasopharyngeal Nasopharyngeal Swab     Status: None   Collection Time: 03/18/21  6:10 PM  Specimen: Nasopharyngeal Swab  Result Value Ref Range Status   SARS Coronavirus 2 NEGATIVE NEGATIVE Final    Comment: (NOTE) SARS-CoV-2 target nucleic acids are NOT DETECTED.  The SARS-CoV-2 RNA is generally detectable in upper and lower respiratory specimens during the acute phase of infection. Negative results do not preclude SARS-CoV-2 infection, do not rule out co-infections with other pathogens, and should not be used as the sole basis for treatment or other patient management decisions. Negative results must be combined with clinical observations, patient history, and epidemiological information. The expected result is Negative.  Fact Sheet for Patients: SugarRoll.be  Fact Sheet for Healthcare Providers: https://www.woods-mathews.com/  This test is not yet approved or cleared by the [place] FDA and  has been authorized for detection and/or diagnosis of SARS-CoV-2 by FDA under an Emergency Use Authorization (EUA). This EUA will remain  in effect (meaning this test can be used) for the duration of the COVID-19 declaration under Se ction 564(b)(1) of the Act, 21  U.S.C. section 360bbb-3(b)(1), unless the authorization is terminated or revoked sooner.  Performed at [facility], [place] [address] [place], [place] [address]   MRSA PCR Screening     Status: None   Collection Time: 03/18/21  8:25 PM   Specimen: Nasal Mucosa; Nasopharyngeal  Result Value Ref Range Status   MRSA by PCR NEGATIVE NEGATIVE Final    Comment:        The GeneXpert MRSA Assay (FDA approved for NASAL specimens only), is one component of a comprehensive MRSA colonization surveillance program. It is not intended to diagnose MRSA infection nor to guide or monitor treatment for MRSA infections. Performed at [facility], [place] [address], [place], [place] [address]       Radiology Studies: No results found.  Scheduled Meds: . amantadine  100 mg Per Tube BID  . amLODipine  10 mg Per Tube Daily  . benztropine  1 mg Per Tube QHS  . Chlorhexidine Gluconate Cloth  6 each Topical Q0600  . dexamethasone (DECADRON) injection  6 mg Intravenous Q12H  . enoxaparin (LOVENOX) injection  40 mg Subcutaneous Q24H  . feeding supplement (PROSource TF)  45 mL Per Tube BID  . haloperidol  2 mg Per Tube Daily  . haloperidol  5 mg Per Tube QHS  . hydrochlorothiazide  25 mg Per Tube Daily  . insulin aspart  0-15 Units Subcutaneous Q4H  . LORazepam  2 mg Intravenous Once  . losartan  25 mg Per Tube Daily  . mouth rinse  15 mL Mouth Rinse BID  . methimazole  10 mg Per Tube BID  . pantoprazole sodium  40 mg Per Tube Daily  . polyethylene glycol  17 g Per Tube Daily  . propranolol  20 mg Per Tube BID  . senna-docusate  1 tablet Per Tube BID  . traZODone  100 mg Per Tube QHS   Continuous Infusions: . dextrose 5 % and 0.45% NaCl 75 mL/hr at 03/25/21 1600  . feeding supplement (OSMOLITE 1.2 CAL) 60 mL/hr at 03/25/21 1804     LOS: 9 days   Time spent: 28 minutes   Darliss Cheney, MD [facility]  03/27/2021, 1:11 PM   How to contact the [facility] Attending or Consulting  provider [place] or covering provider during after hours [place], for this patient?  1. Check the care team in [facility] and look for a) attending/consulting [facility] provider listed and b) the [facility] team listed. Page or  secure chat [phone]. 2. Log into [email or web address] and use Center's universal password to access. If you do not have the password, please contact the hospital operator. 3. Locate the [facility] provider you are looking for under [facility] and page to a number that you can be directly reached. 4. If you still have difficulty reaching the provider, please page the [facility] (Director on Call) for the Hospitalists listed on amion for assistance.

## 2021-03-27 NOTE — TOC Progression Note (Signed)
Transition of Care [facility]) - Progression Note    Patient Details  Name: Paula Dickson MRN: [id number] Date of Birth: [birth year]  Transition of Care [facility]) CM/SW [place], [place] Phone Number: 03/27/2021, 10:41 AM  Clinical Narrative:    CSW followed up with [facility], they noted they are still currently reviewing the referral. They noted they would follow up with any updates. SW will continue to follow for transition of care.   Expected Discharge Plan: Psychiatric Hospital Barriers to Discharge: Continued Medical Work up  Expected Discharge Plan and Services Expected Discharge Plan: [facility]                                               Social Determinants of Health (SDOH) Interventions    Readmission Risk Interventions No flowsheet data found.

## 2021-03-27 NOTE — Progress Notes (Signed)
Consult to dietician completed per MD request to see if coretrak could be removed

## 2021-03-28 DIAGNOSIS — F200 Paranoid schizophrenia: Secondary | ICD-10-CM | POA: Diagnosis not present

## 2021-03-28 LAB — HEMOGLOBIN A1C
Hgb A1c MFr Bld: 5.8 % — ABNORMAL HIGH (ref 4.8–5.6)
Mean Plasma Glucose: 119.76 mg/dL

## 2021-03-28 LAB — GLUCOSE, CAPILLARY
Glucose-Capillary: 205 mg/dL — ABNORMAL HIGH (ref 70–99)
Glucose-Capillary: 177 mg/dL — ABNORMAL HIGH (ref 70–99)
Glucose-Capillary: 158 mg/dL — ABNORMAL HIGH (ref 70–99)
Glucose-Capillary: 119 mg/dL — ABNORMAL HIGH (ref 70–99)
Glucose-Capillary: 113 mg/dL — ABNORMAL HIGH (ref 70–99)
Glucose-Capillary: 101 mg/dL — ABNORMAL HIGH (ref 70–99)

## 2021-03-28 MED ORDER — ENSURE ENLIVE PO LIQD
237.0000 mL | Freq: Three times a day (TID) | ORAL | Status: DC
  Administered 2021-03-28 – 2021-03-29 (×5): 237 mL via ORAL
  Filled 2021-03-28 (×2): qty 237

## 2021-03-28 MED ORDER — ADULT MULTIVITAMIN W/MINERALS CH
1.0000 | ORAL_TABLET | Freq: Every day | ORAL | Status: DC
  Administered 2021-03-28: 1 via ORAL
  Filled 2021-03-28 (×2): qty 1

## 2021-03-28 NOTE — Progress Notes (Signed)
PROGRESS NOTE    Paula Dickson  SNK:[id number] DOB: 05-29-1966 DOA: 03/18/2021 PCP: Pcp, No   Brief Narrative:  55 yo female with history of bipolar disorder, schizophrenia, hypothyroidism and hypertension found at home by family member unresponsive on the floor.  EMS called and patient found with gurgling respirations and surrounded by pill bottles.  Required intubation for airway protection.  Family did not come to ER with patient.  Last known normal time was in evening of 03/17/21.  Had recent admission for hypernatremia and AKI.  Has hx of medical non-compliance.  Patient was intubated in the ED for airway protection.  She was extubated on 9/37/9024 complicated by stridor requiring reintubation and initiation of Decadron.  She was subsequently extubated again on 03/24/2021 and did not pass nursing bedside swallow screen. SLP consulted and patient was able to swallow thicker liquids. Continued to have episodes of agitation and restlessness accompanied by HBPs to 190s/100s and started on amlodipine, HCTZ and PRN labetalol.   Evaluated by psychiatry who recommended inpatient psychiatry admission.  She refused to eat. cortrak was placed and she was started on tube feedings.  Subsequently transferred under [facility] care on 03/26/2021.  Awaiting bed availability at behavioral health.  Assessment & Plan:   Principal Problem:   Schizophrenia ([place]) Active Problems:   Overdose   Respiratory failure ([place])   Encounter for intubation   Encephalopathy acute   Malnutrition of moderate degree  Intentional overdose with benzodiazepine and Tegretol with history of bipolar disorder, paranoid schizophrenia: Patient required intubation for airway protection finally was extubated on 03/24/2021.  Evaluated by psychiatry.  They recommend inpatient psych admission pending medical clearance.  Patient is medically cleared for transfer to behavioral health.  TOC aware and working with behavioral health for bed.  She was  reassessed by psychiatry today and they maintained their recommendation for inpatient psychiatry admission.  Continue following medications per psychiatry. Amantadine 100 mg twice daily Haldol 5 mg nightly and 2 mg daily. Cogentin 1 mg at bedtime.  Refusal to eat/dysphagia: Patient is a started to eat more now.  Evaluated by nutrition.  Plan to hold off to NG tube feedings but continue tube in place and reassess tomorrow.  Hypertension: Blood pressure very well controlled.  Continue amlodipine, losartan and hydrochlorothiazide and as needed labetalol.  History of hypothyroidism: Continue propranolol and methimazole.  DVT prophylaxis: enoxaparin (LOVENOX) injection 40 mg Start: 03/19/21 1000 Place and maintain sequential compression device Start: 03/18/21 1850   Code Status: Full Code  Family Communication:  None present at bedside.   Status is: Inpatient  Remains inpatient appropriate because:Unsafe d/c plan   Dispo: The patient is from: Home              Anticipated d/c is to: [facility]              Patient currently is medically stable to d/c.   Difficult to place patient No        Estimated body mass index is 23.78 kg/m as calculated from the following:   Height as of this encounter: 5\' 3"  (1.6 m).   Weight as of this encounter: 60.9 kg.      Nutritional status:  Nutrition Problem: Moderate Malnutrition Etiology: social / environmental circumstances   Signs/Symptoms: severe muscle depletion,moderate muscle depletion,moderate fat depletion   Interventions: Prostat,Tube feeding    Consultants:   Psychiatry  Procedures:   None  Antimicrobials:  Anti-infectives (From admission, onward)   None  Subjective: Seen and examined.  Awake and alert and pleasant during my encounter.  No complaints.  Objective: Vitals:   03/28/21 0500 03/28/21 0724 03/28/21 1001 03/28/21 1251  BP:  128/75 137/80 127/84  Pulse:  82 85 77  Resp:  16  (!) 22  Temp:   98.8 F (37.1 C)  98.6 F (37 C)  TempSrc:  Oral  Oral  SpO2:  99%  100%  Weight: 60.9 kg     Height:        Intake/Output Summary (Last 24 hours) at 03/28/2021 1304 Last data filed at 03/28/2021 1254 Gross per 24 hour  Intake 2603.62 ml  Output 1700 ml  Net 903.62 ml   Filed Weights   03/18/21 1830 03/26/21 0500 03/28/21 0500  Weight: 60 kg 59 kg 60.9 kg    Examination:  General exam: Appears calm and comfortable  Respiratory system: Clear to auscultation. Respiratory effort normal. Cardiovascular system: S1 & S2 heard, RRR. No JVD, murmurs, rubs, gallops or clicks. No pedal edema. Gastrointestinal system: Abdomen is nondistended, soft and nontender. No organomegaly or masses felt. Normal bowel sounds heard. Central nervous system: Alert and oriented. No focal neurological deficits. Extremities: Symmetric 5 x 5 power. Skin: No rashes, lesions or ulcers.  Psychiatry: Judgement and insight poor.  Data Reviewed: I have personally reviewed following labs and imaging studies  CBC: Recent Labs  Lab 03/23/21 0437 03/24/21 0305  WBC 7.8 9.8  HGB 11.3* 10.3*  HCT 35.9* 32.5*  MCV 90.0 88.3  PLT 245 884   Basic Metabolic Panel: Recent Labs  Lab 03/23/21 0437 03/24/21 0305 03/25/21 1418 03/26/21 0557 03/26/21 1653  NA 149* 149*  --   --   --   K 4.6 4.1  --   --   --   CL 113* 114*  --   --   --   CO2 26 28  --   --   --   GLUCOSE 180* 114*  --   --   --   BUN 25* 28*  --   --   --   CREATININE 0.95 0.86  --   --   --   CALCIUM 10.0 9.9  --   --   --   MG 2.1 2.2 2.0 1.9 1.8  PHOS 3.1 2.9 2.4* 2.3* 3.6   GFR: Estimated Creatinine Clearance: 59.7 mL/min (by C-G formula based on SCr of 0.86 mg/dL). Liver Function Tests: No results for input(s): AST, ALT, ALKPHOS, BILITOT, PROT, ALBUMIN in the last 168 hours. No results for input(s): LIPASE, AMYLASE in the last 168 hours. No results for input(s): AMMONIA in the last 168 hours. Coagulation Profile: No results for  input(s): INR, PROTIME in the last 168 hours. Cardiac Enzymes: No results for input(s): CKTOTAL, CKMB, CKMBINDEX, TROPONINI in the last 168 hours. BNP (last 3 results) No results for input(s): PROBNP in the last 8760 hours. HbA1C: Recent Labs    03/28/21 0819  HGBA1C 5.8*   CBG: Recent Labs  Lab 03/27/21 2303 03/28/21 0314 03/28/21 0745 03/28/21 0927 03/28/21 1219  GLUCAP 145* 205* 177* 158* 119*   Lipid Profile: No results for input(s): CHOL, HDL, LDLCALC, TRIG, CHOLHDL, LDLDIRECT in the last 72 hours. Thyroid Function Tests: No results for input(s): TSH, T4TOTAL, FREET4, T3FREE, THYROIDAB in the last 72 hours. Anemia Panel: No results for input(s): VITAMINB12, FOLATE, FERRITIN, TIBC, IRON, RETICCTPCT in the last 72 hours. Sepsis Labs: No results for input(s): PROCALCITON, LATICACIDVEN in the last 168 hours.  Recent Results (from the past 240 hour(s))  SARS CORONAVIRUS 2 (TAT 6-24 HRS) Nasopharyngeal Nasopharyngeal Swab     Status: None   Collection Time: 03/18/21  6:10 PM   Specimen: Nasopharyngeal Swab  Result Value Ref Range Status   SARS Coronavirus 2 NEGATIVE NEGATIVE Final    Comment: (NOTE) SARS-CoV-2 target nucleic acids are NOT DETECTED.  The SARS-CoV-2 RNA is generally detectable in upper and lower respiratory specimens during the acute phase of infection. Negative results do not preclude SARS-CoV-2 infection, do not rule out co-infections with other pathogens, and should not be used as the sole basis for treatment or other patient management decisions. Negative results must be combined with clinical observations, patient history, and epidemiological information. The expected result is Negative.  Fact Sheet for Patients: SugarRoll.be  Fact Sheet for Healthcare Providers: https://www.woods-mathews.com/  This test is not yet approved or cleared by the [place] FDA and  has been authorized for detection and/or  diagnosis of SARS-CoV-2 by FDA under an Emergency Use Authorization (EUA). This EUA will remain  in effect (meaning this test can be used) for the duration of the COVID-19 declaration under Se ction 564(b)(1) of the Act, 21 U.S.C. section 360bbb-3(b)(1), unless the authorization is terminated or revoked sooner.  Performed at [facility], [place] [address], [place], [place] [address]   MRSA PCR Screening     Status: None   Collection Time: 03/18/21  8:25 PM   Specimen: Nasal Mucosa; Nasopharyngeal  Result Value Ref Range Status   MRSA by PCR NEGATIVE NEGATIVE Final    Comment:        The GeneXpert MRSA Assay (FDA approved for NASAL specimens only), is one component of a comprehensive MRSA colonization surveillance program. It is not intended to diagnose MRSA infection nor to guide or monitor treatment for MRSA infections. Performed at [facility], [place] [address], [place], [place] [address]       Radiology Studies: No results found.  Scheduled Meds: . amantadine  100 mg Per Tube BID  . amLODipine  10 mg Per Tube Daily  . benztropine  1 mg Per Tube QHS  . Chlorhexidine Gluconate Cloth  6 each Topical Q0600  . dexamethasone (DECADRON) injection  6 mg Intravenous Q12H  . enoxaparin (LOVENOX) injection  40 mg Subcutaneous Q24H  . feeding supplement  237 mL Oral TID BM  . feeding supplement (PROSource TF)  45 mL Per Tube BID  . haloperidol  2 mg Per Tube Daily  . haloperidol  5 mg Per Tube QHS  . hydrochlorothiazide  25 mg Per Tube Daily  . insulin aspart  0-15 Units Subcutaneous Q4H  . losartan  25 mg Per Tube Daily  . mouth rinse  15 mL Mouth Rinse BID  . methimazole  10 mg Per Tube BID  . multivitamin with minerals  1 tablet Oral Daily  . pantoprazole sodium  40 mg Per Tube Daily  . polyethylene glycol  17 g Per Tube Daily  . propranolol  20 mg Per Tube BID  . senna-docusate  1 tablet Per Tube BID  . traZODone  100 mg Per Tube QHS   Continuous  Infusions: . dextrose 5 % and 0.45% NaCl 75 mL/hr at 03/28/21 0548  . feeding supplement (OSMOLITE 1.2 CAL) 60 mL/hr at 03/25/21 1804     LOS: 10 days   Time spent: 26 minutes   Darliss Cheney, MD [facility]  03/28/2021, 1:04 PM   How to  contact the [facility] Attending or Consulting provider [place] or covering provider during after hours [place], for this patient?  1. Check the care team in [facility] and look for a) attending/consulting TR[facility] provider listed and b) the [facility] team listed. Page or secure chat [phone]. 2. Log into [email or web address] and use [place]'s universal password to access. If you do not have the password, please contact the hospital operator. 3. Locate the [facility] provider you are looking for under [facility] and page to a number that you can be directly reached. 4. If you still have difficulty reaching the provider, please page the [facility] (Director on Call) for the Hospitalists listed on amion for assistance.

## 2021-03-28 NOTE — Progress Notes (Signed)
Pt. meets criteria for inpatient treatment per Sheran Fava, NP. Referred out to the following hospitals:   [place] Provider Address Phone Fax  [facility]  [address] [place] [address] [place]  [facility]  [address], [place] [address] [phone] [facility]  [place], [place] [address] [phone]  [facility]  [address] [place] [address] [phone]  [facility]  [address] [place] [address] [phone]  [facility]  [address] [place] [address] [place]  [address] [place] [address] [phone]  [facility]  [address] [place] [address] [phone]  [facility]  [address] [place] [address] [place]  [facility]  [address] [place] [address] [phone] [place]  [address], [place] [address] [phone]  [facility]  [address] [place] [address] [phone] [place] [place] [address] [phone] [facility]  [place], [place] [address] [phone]  [facility]  [address], [place] [phone] [facility]  [address], [place] [address] [phone]    Patient also referred to [facility] in addition to list above (P: [phone]) (F: [phone]).  Disposition CSW will continue  to follow for placement.  Signed:  Durenda Hurt, MSW, [place], [place] 03/28/2021 6:48 PM

## 2021-03-28 NOTE — Progress Notes (Signed)
Nutrition Follow-up  DOCUMENTATION CODES:   Non-severe (moderate) malnutrition in context of social or environmental circumstances  INTERVENTION:   Ensure Enlive po TID, each supplement provides 350 kcal and 20 grams of protein  Magic cup TID with meals, each supplement provides 290 kcal and 9 grams of protein  MVI with minerals daily   Keep tube feeding on hold, but leave Cortrak in place at this time. RD will monitor pt's intake over the next 24 hours and decision will be made regarding keeping the Cortrak tomorrow. MD agreeable to plan.   NUTRITION DIAGNOSIS:   Moderate Malnutrition related to social / environmental circumstances as evidenced by severe muscle depletion,moderate muscle depletion,moderate fat depletion.  ongoing  GOAL:   Patient will meet greater than or equal to 90% of their needs  progressing  MONITOR:   PO intake,TF tolerance,Diet advancement  REASON FOR ASSESSMENT:   Ventilator,Consult Enteral/tube feeding initiation and management  ASSESSMENT:   55 year old female who presented to the ED on 4/22 after being found unresponsive by family surrounded by multiple pill bottles. PMH of hypothyroidism, PAF, bipolar disorder, schizophrenia, HTN. Pt intubated in the ED and admitted with intentional overdose.   4/22 intubated; OG placed 4/23 TF initiated 4/26 extubated and reintubated 4/28 extubated; OG removed 4/29 Cortrak placed (gastric)  Discussed pt with RN who reports pt did well with dinner last night, but did poor-fair with breakfast and lunch yesterday. Meal completions charted as follows: Breakfast 25%, Lunch 50%, Dinner 100%. Observed pt's breakfast tray today at bedside, ~ 30% consumed. Pt reports appetite is good, but that she did not like the taste of the food and is unable to force herself to eat things she does not enjoy. Pt feels confident she can meet her needs po. Discussed with RN and MD. Plan to hold all TF orders today but keep Cortrak  in place. RN to provide meal documentation throughout the day. RD will also order oral nutrition supplements to provide pt with additional oral kcals/protein throughout the day. If intake is sufficient, Cortrak may be removed tomorrow. MD and pt agreeable to plan.   UOP: 556ml documented x24 hours  Admit weight: 60 kg Current weight: 60.9 kg  Medications: decadron, SSI Q4H, mvi with minerals, protonix, miralax, senokot-s Labs reviewed. CBGs [phone]  Diet Order:   Diet Order            Diet regular Room service appropriate? Yes; Fluid consistency: Thin  Diet effective now                 EDUCATION NEEDS:   No education needs have been identified at this time  Skin:  Skin Assessment: Reviewed RN Assessment  Last BM:  5/2  Height:   Ht Readings from Last 1 Encounters:  03/22/21 5\' 3"  (1.6 m)    Weight:   Wt Readings from Last 1 Encounters:  03/28/21 60.9 kg   BMI:  Body mass index is 23.78 kg/m.  Estimated Nutritional Needs:   Kcal:  1700-1900  Protein:  90-110 grams  Fluid:  >1.7 L/day    Larkin Ina, MS, RD, LDN RD pager number and weekend/on-call pager number located in [place].

## 2021-03-28 NOTE — Consult Note (Signed)
[facility] Consult   Reason for Consult: Self harm behavior and overdose Referring Physician:  Kipp Brood Patient Identification: Paula Dickson MRN:  [id number] Principal Diagnosis: Schizophrenia (Paula Dickson) Diagnosis:  Principal Problem:   Schizophrenia (Paula Dickson) Active Problems:   Overdose   Respiratory failure ([place])   Encounter for intubation   Encephalopathy acute   Malnutrition of moderate degree   Total Time spent with patient: 45 minutes  Subjective:   Paula Dickson is a 55 y.o. female with past medical history significant for Schizoaffective disorder bipolar type, hypertension and hypothyroidism found unresponsive on the floor.  Patient was found with gurgling respirations and surrounded by pill bottles.(Medications from home brought with the patient at bedside -Tegretol, lorazepam 1 mg, Klonopin, amlodipine 10 mg Haldol 2 mg). Most of the benzodiazepines were gone.   Psych consult was placed for suicide attempt. Patient is seen and assessed today, she continues to be a poor historian although she has improved greatly over the weekend. She originally was pleasant and jovial, until we discussed her reasons for admission. "Wouldn't you want to hurt yourself id someone was after you?" She endorses auditory and visual hallucinations at this time. Patient continues to be a poor historian, perservates and ruminates. She is preoccupied on her medications, medication regimen and pill bottles.She remains with loose associations, although most of her conversation is linear.    She states she has a diagnosis of schizophrenia and bipolar.  Patient denies drinking alcohol, or using any recreational drugs.On examination, patient is alert, confused and and her thought process is loose and linear.  Patient is paranoid and delusional, she does at times endorse hallucinations. As noted she will not provide details.  Unable to assess her for suicidal ideation as she declines further  information  Past Psychiatric History: Schizoaffective disorder bipolar type- See H&P Trial and failed multiple psychotropic medication to include Haldol, risperidone, Haldol decanoate, Cogentin, carbamazepine, clozapine, hydroxyzine, temazepam.   Risk to Self:   Yes Risk to Others:  No Prior Inpatient Therapy:  Yes Prior Outpatient Therapy:  No  Past Medical History:  Past Medical History:  Diagnosis Date  . Bipolar disorder (Paula Dickson)   . Hypertension   . Schizophrenia (Paula Dickson)     Schizoaffective Bipolar type, Pt has 2 charts. Family History: No family history on file. Family Psychiatric  History: Brother- Bipolar Social History:  Social History   Substance and Sexual Activity  Alcohol Use Not Currently     Social History   Substance and Sexual Activity  Drug Use Not Currently    Social History   Socioeconomic History  . Marital status: Single    Spouse name: Not on file  . Number of children: Not on file  . Years of education: Not on file  . Highest education level: Not on file  Occupational History  . Not on file  Tobacco Use  . Smoking status: Never Smoker  . Smokeless tobacco: Never Used  Vaping Use  . Vaping Use: Never used  Substance and Sexual Activity  . Alcohol use: Not Currently  . Drug use: Not Currently  . Sexual activity: Not on file  Other Topics Concern  . Not on file  Social History Narrative  . Not on file   Social Determinants of Health   Financial Resource Strain: Not on file  Food Insecurity: Not on file  Transportation Needs: Not on file  Physical Activity: Not on file  Stress: Not on file  Social Connections: Not on file  Additional Social History:    Allergies:  No Known Allergies  Labs:  Results for orders placed or performed during the hospital encounter of 03/18/21 (from the past 48 hour(s))  Magnesium     Status: None   Collection Time: 03/26/21  4:53 PM  Result Value Ref Range   Magnesium 1.8 1.7 - 2.4 mg/dL    Comment:  Performed at [facility], [place] [address] [place], [place] [address]  Phosphorus     Status: None   Collection Time: 03/26/21  4:53 PM  Result Value Ref Range   Phosphorus 3.6 2.5 - 4.6 mg/dL    Comment: Performed at [facility] [address], [place], [place] [address]  Glucose, capillary     Status: Abnormal   Collection Time: 03/26/21  5:16 PM  Result Value Ref Range   Glucose-Capillary 142 (H) 70 - 99 mg/dL    Comment: Glucose reference range applies only to samples taken after fasting for at least 8 hours.  Glucose, capillary     Status: Abnormal   Collection Time: 03/26/21  9:16 PM  Result Value Ref Range   Glucose-Capillary 159 (H) 70 - 99 mg/dL    Comment: Glucose reference range applies only to samples taken after fasting for at least 8 hours.  Glucose, capillary     Status: Abnormal   Collection Time: 03/26/21 11:38 PM  Result Value Ref Range   Glucose-Capillary 150 (H) 70 - 99 mg/dL    Comment: Glucose reference range applies only to samples taken after fasting for at least 8 hours.  Glucose, capillary     Status: Abnormal   Collection Time: 03/27/21  4:03 AM  Result Value Ref Range   Glucose-Capillary 185 (H) 70 - 99 mg/dL    Comment: Glucose reference range applies only to samples taken after fasting for at least 8 hours.  Glucose, capillary     Status: Abnormal   Collection Time: 03/27/21  8:53 AM  Result Value Ref Range   Glucose-Capillary 151 (H) 70 - 99 mg/dL    Comment: Glucose reference range applies only to samples taken after fasting for at least 8 hours.  Glucose, capillary     Status: Abnormal   Collection Time: 03/27/21 12:09 PM  Result Value Ref Range   Glucose-Capillary 168 (H) 70 - 99 mg/dL    Comment: Glucose reference range applies only to samples taken after fasting for at least 8 hours.  Glucose, capillary     Status: Abnormal   Collection Time: 03/27/21  3:43 PM  Result Value Ref Range   Glucose-Capillary 179 (H) 70 - 99 mg/dL     Comment: Glucose reference range applies only to samples taken after fasting for at least 8 hours.  Glucose, capillary     Status: Abnormal   Collection Time: 03/27/21  8:12 PM  Result Value Ref Range   Glucose-Capillary 170 (H) 70 - 99 mg/dL    Comment: Glucose reference range applies only to samples taken after fasting for at least 8 hours.  Glucose, capillary     Status: Abnormal   Collection Time: 03/27/21 11:03 PM  Result Value Ref Range   Glucose-Capillary 145 (H) 70 - 99 mg/dL    Comment: Glucose reference range applies only to samples taken after fasting for at least 8 hours.  Glucose, capillary     Status: Abnormal   Collection Time: 03/28/21  3:14 AM  Result Value Ref Range   Glucose-Capillary 205 (H) 70 -  99 mg/dL    Comment: Glucose reference range applies only to samples taken after fasting for at least 8 hours.  Glucose, capillary     Status: Abnormal   Collection Time: 03/28/21  7:45 AM  Result Value Ref Range   Glucose-Capillary 177 (H) 70 - 99 mg/dL    Comment: Glucose reference range applies only to samples taken after fasting for at least 8 hours.  Hemoglobin A1c     Status: Abnormal   Collection Time: 03/28/21  8:19 AM  Result Value Ref Range   Hgb A1c MFr Bld 5.8 (H) 4.8 - 5.6 %    Comment: (NOTE) Pre diabetes:          5.7%-6.4%  Diabetes:              >6.4%  Glycemic control for   <7.0% adults with diabetes    Mean Plasma Glucose 119.76 mg/dL    Comment: Performed at [facility] [address], [place], [place] [address]  Glucose, capillary     Status: Abnormal   Collection Time: 03/28/21  9:27 AM  Result Value Ref Range   Glucose-Capillary 158 (H) 70 - 99 mg/dL    Comment: Glucose reference range applies only to samples taken after fasting for at least 8 hours.  Glucose, capillary     Status: Abnormal   Collection Time: 03/28/21 12:19 PM  Result Value Ref Range   Glucose-Capillary 119 (H) 70 - 99 mg/dL    Comment: Glucose reference range  applies only to samples taken after fasting for at least 8 hours.    Current Facility-Administered Medications  Medication Dose Route Frequency Provider Last Rate Last Admin  . amantadine (SYMMETREL) 50 MG/5ML solution 100 mg  100 mg Per Tube BID Simonne Maffucci B, MD   100 mg at 03/28/21 1010  . amLODipine (NORVASC) tablet 10 mg  10 mg Per Tube Daily Simonne Maffucci B, MD   10 mg at 03/28/21 1009  . benztropine (COGENTIN) tablet 1 mg  1 mg Per Tube QHS Simonne Maffucci B, MD   1 mg at 03/27/21 2142  . Chlorhexidine Gluconate Cloth 2 % PADS 6 each  6 each Topical Q0600 Chesley Mires, MD   6 each at 03/27/21 [phone]  . dexamethasone (DECADRON) injection 6 mg  6 mg Intravenous Q12H Simonne Maffucci B, MD   6 mg at 03/28/21 1000  . dextrose 5 %-0.45 % sodium chloride infusion   Intravenous Continuous Juanito Doom, MD 75 mL/hr at 03/28/21 0548 New Bag at 03/28/21 0548  . docusate sodium (COLACE) capsule 100 mg  100 mg Oral Daily PRN Kipp Brood, MD   100 mg at 03/27/21 2155  . enoxaparin (LOVENOX) injection 40 mg  40 mg Subcutaneous Q24H Chesley Mires, MD   40 mg at 03/28/21 1008  . feeding supplement (ENSURE ENLIVE / ENSURE PLUS) liquid 237 mL  237 mL Oral TID BM Pahwani, Ravi, MD   237 mL at 03/28/21 1011  . feeding supplement (OSMOLITE 1.2 CAL) liquid 1,000 mL  1,000 mL Per Tube Continuous Juanito Doom, MD 60 mL/hr at 03/25/21 1804 Infusion Verify at 03/25/21 1804  . feeding supplement (PROSource TF) liquid 45 mL  45 mL Per Tube BID Simonne Maffucci B, MD   45 mL at 03/28/21 1011  . haloperidol (HALDOL) tablet 2 mg  2 mg Per Tube Daily Simonne Maffucci B, MD   2 mg at 03/28/21 0954  . haloperidol (HALDOL) tablet 5 mg  5 mg Per Tube QHS Juanito Doom, MD   5 mg at 03/27/21 2141  . hydrochlorothiazide (HYDRODIURIL) tablet 25 mg  25 mg Per Tube Daily Juanito Doom, MD   25 mg at 03/28/21 0956  . insulin aspart (novoLOG) injection 0-15 Units  0-15 Units Subcutaneous Q4H Chesley Mires, MD   3 Units at 03/28/21 1012  . labetalol (NORMODYNE) injection 10 mg  10 mg Intravenous Q10 min PRN Kipp Brood, MD   10 mg at 03/25/21 0750  . losartan (COZAAR) tablet 25 mg  25 mg Per Tube Daily Simonne Maffucci B, MD   25 mg at 03/28/21 1000  . MEDLINE mouth rinse  15 mL Mouth Rinse BID Agarwala, Ravi, MD   15 mL at 03/28/21 1014  . methimazole (TAPAZOLE) tablet 10 mg  10 mg Per Tube BID Simonne Maffucci B, MD   10 mg at 03/28/21 0956  . multivitamin with minerals tablet 1 tablet  1 tablet Oral Daily Darliss Cheney, MD   1 tablet at 03/28/21 1018  . pantoprazole sodium (PROTONIX) 40 mg/20 mL oral suspension 40 mg  40 mg Per Tube Daily Simonne Maffucci B, MD   40 mg at 03/28/21 1013  . polyethylene glycol (MIRALAX / GLYCOLAX) packet 17 g  17 g Per Tube Daily Simonne Maffucci B, MD   17 g at 03/25/21 1311  . propranolol (INDERAL) tablet 20 mg  20 mg Per Tube BID Simonne Maffucci B, MD   20 mg at 03/28/21 1001  . senna-docusate (Senokot-S) tablet 1 tablet  1 tablet Per Tube BID Juanito Doom, MD   1 tablet at 03/28/21 0955  . traZODone (DESYREL) tablet 100 mg  100 mg Per Tube QHS Juanito Doom, MD   100 mg at 03/27/21 2204    Musculoskeletal: Strength & Muscle Tone: within normal limits Gait & Station: normal, unable to stand Patient leans: N/A Psychiatric Specialty Exam:  Presentation  General Appearance: Appropriate for Environment; Casual  Eye Contact:Good  Speech:Clear and Coherent; Pressured  Speech Volume:Normal  Handedness:Right   Mood and Affect  Mood:Anxious; Euphoric  Affect:Inappropriate; Full Range   Thought Process  Thought Processes:Irrevelant; Linear  Descriptions of Associations:Loose  Orientation:Full (Time, Place and Person)  Thought Content:Logical; Perseveration; Rumination  History of Schizophrenia/Schizoaffective disorder:Yes  Duration of Psychotic Symptoms:Greater than six months  Hallucinations:Hallucinations: Auditory;  Visual Description of Auditory Hallucinations: Unable to describ Description of Visual Hallucinations: Unable to describe  Ideas of Reference:Paranoia  Suicidal Thoughts:Suicidal Thoughts: -- (UTA)  Homicidal Thoughts:Homicidal Thoughts: No   Sensorium  Memory:Immediate Fair; Recent Poor; Remote Poor  Judgment:Poor  Insight:Poor   Executive Functions  Concentration:Fair  Attention Span:Fair  [place]   Psychomotor Activity  Psychomotor Activity:Psychomotor Activity: Tremor AIMS Completed?: No   Assets  Assets:Communication Skills; Desire for Improvement; Talents/Skills; Social Support; Resilience; Physical Health   Sleep  Sleep:Sleep: Poor   Physical Exam: Physical Exam Vitals and nursing note reviewed.  Constitutional:      General: She is not in acute distress.    Appearance: She is not ill-appearing, toxic-appearing or diaphoretic.  HENT:     Head: Normocephalic.  Pulmonary:     Effort: Pulmonary effort is normal.  Neurological:     General: No focal deficit present.     Mental Status: She is alert.     Comments: Oriented to self, knows she is in the hospital, knows it is 2022 but does not know month.  Psychiatric:  Attention and Perception: Attention normal. She perceives auditory and visual hallucinations.        Mood and Affect: Mood normal.        Speech: Speech normal. Noncommunicative: originally did not speak, but told patient I overheard her talking in the hallway.         Behavior: Behavior normal. Behavior is cooperative.        Thought Content: Thought content normal.        Cognition and Memory: Cognition normal.        Judgment: Judgment normal.    Review of Systems  Psychiatric/Behavioral: Positive for hallucinations and suicidal ideas. The patient is nervous/anxious.   All other systems reviewed and are negative.    Blood pressure 137/80, pulse 85, temperature 98.8 F (37.1 C),  temperature source Oral, resp. rate 16, height 5\' 3"  (1.6 m), weight 60.9 kg, SpO2 99 %. Body mass index is 23.78 kg/m.  Treatment Plan Summary: Case discussed with Dr. Dwyane Dee. Patient meets criteria for inpatient hospitalization to [facility].  -Continue to recommend inpatient admission. If no appropriate beds at [facility] please refer out of system. Continue facilitating referrals with SW.   -Continue amantadine 100 mg twice daily. -COntinue current dose of Haldol to 5 mg nightly and Continue Haldol 2 mg daily. -Continue Cogentin 1 mg at bedtime.  Disposition: Recommend psychiatric Inpatient admission when medically cleared.  Suella Broad, FNP 03/28/2021 12:26 PM

## 2021-03-28 NOTE — Progress Notes (Signed)
  Speech Language Pathology Treatment: Dysphagia  Patient Details Name: Paula Dickson MRN: [id number] DOB: 10-15-1966 Today's Date: 03/28/2021 Time: 5625-6389 SLP Time Calculation (min) (ACUTE ONLY): 8 min  Assessment / Plan / Recommendation Clinical Impression  Pt now on regular diet and thin liquids, tolerating well per pt and staff; consumed 80% of dinner yesterday. Vocal quality now clear, pt also reports noticing improvement. Encouraged RN and pt to take meds whole with thin liquids to determine if NG could be removed in the near future. Pt able to take one pill with ginger ale without difficulty. Hopefully will not need significant encouragement. Given resolution of dysphagia will sign off.   HPI HPI: 55 yo female found at home by family member unresponsive on the floor.  EMS called and patient found with gurgling respirations and surrounded by pill bottles.  Required intubation for airway protection.  Family did not come to ER with patient.  Last known normal time was in evening of 03/17/21.  Had recent admission for hypernatremia and AKI.  Has hx of medical non-compliance. Intubated 4/22-4/26, reintubated 4/26-4/28. Failed [facility].      SLP Plan  Discharge SLP treatment due to (comment)       Recommendations  Diet recommendations: Regular;Thin liquid Liquids provided via: Cup;Straw Medication Administration: Whole meds with liquid Supervision: Intermittent supervision to cue for compensatory strategies Compensations: Slow rate;Small sips/bites;Minimize environmental distractions Postural Changes and/or Swallow Maneuvers: Seated upright 90 degrees                Follow up Recommendations: 24 hour supervision/assistance SLP Visit Diagnosis: Dysphagia, oropharyngeal phase (R13.12) Plan: Discharge SLP treatment due to (comment)       GO               Herbie Baltimore, MA [place] Pager [phone] Office [phone]  Lynann Beaver 03/28/2021, 10:04 AM

## 2021-03-28 NOTE — TOC Progression Note (Signed)
Transition of Care [facility]) - Progression Note    Patient Details  Name: Paula Dickson MRN: [id number] Date of Birth: [birth year]  [facility] CM/SW Contact  Joanne Chars, LCSW Phone Number: 03/28/2021, 3:10 PM  Clinical Narrative:  No beds available at [name].  [facility] disposition CSW to fax pt out to other psych inpt facilities.       Expected Discharge Plan: Psychiatric Hospital Barriers to Discharge: Continued Medical Work up  Expected Discharge Plan and Services Expected Discharge Plan: [facility]                                               Social Determinants of Health (SDOH) Interventions    Readmission Risk Interventions No flowsheet data found.

## 2021-03-28 NOTE — Care Management Important Message (Signed)
Important Message  Patient Details  Name: Paula Dickson MRN: [id number] Date of Birth: Mar 06, 1966   Medicare Important Message Given:  Yes     Kinze Labo [address] 03/28/2021, 3:34 PM

## 2021-03-29 DIAGNOSIS — F200 Paranoid schizophrenia: Secondary | ICD-10-CM | POA: Diagnosis not present

## 2021-03-29 LAB — GLUCOSE, CAPILLARY
Glucose-Capillary: 167 mg/dL — ABNORMAL HIGH (ref 70–99)
Glucose-Capillary: 120 mg/dL — ABNORMAL HIGH (ref 70–99)
Glucose-Capillary: 111 mg/dL — ABNORMAL HIGH (ref 70–99)

## 2021-03-29 NOTE — Progress Notes (Signed)
CSW received call from [facility] regarding bed for patient. Representative reports that they need additional information.  CSW provided number to patient social worker, Winferd Humphrey, to get additional information. Of note, representative reports that they are unable to take anyone with feeding tubes and they will need to if he is still on a feeding tube.    Audris Speaker, LCSW, [place] Social Worker  [facility]

## 2021-03-29 NOTE — Plan of Care (Signed)

## 2021-03-29 NOTE — Progress Notes (Signed)
PROGRESS NOTE    Paula Dickson  UEA:[id number] DOB: [birth year] DOA: 03/18/2021 PCP: Pcp, No   Brief Narrative:  56 yo female with history of bipolar disorder, schizophrenia, hypothyroidism and hypertension found at home by family member unresponsive on the floor.  EMS called and patient found with gurgling respirations and surrounded by pill bottles.  Required intubation for airway protection.  Family did not come to ER with patient.  Last known normal time was in evening of 03/17/21.  Had recent admission for hypernatremia and AKI.  Has hx of medical non-compliance.  Patient was intubated in the ED for airway protection.  She was extubated on 4/78/2956 complicated by stridor requiring reintubation and initiation of Decadron.  She was subsequently extubated again on 03/24/2021 and did not pass nursing bedside swallow screen. SLP consulted and patient was able to swallow thicker liquids. Continued to have episodes of agitation and restlessness accompanied by HBPs to 190s/100s and started on amlodipine, HCTZ and PRN labetalol.   Evaluated by psychiatry who recommended inpatient psychiatry admission.  She refused to eat. cortrak was placed and she was started on tube feedings.  Subsequently transferred under [facility] care on 03/26/2021.  Awaiting bed availability at behavioral health.  She has progressed as far as her feeding goes, evaluated by nutrition, they recommend removing tube.  Assessment & Plan:   Principal Problem:   Schizophrenia ([place]) Active Problems:   Overdose   Respiratory failure ([place])   Encounter for intubation   Encephalopathy acute   Malnutrition of moderate degree  Intentional overdose with benzodiazepine and Tegretol with history of bipolar disorder, paranoid schizophrenia: Patient required intubation for airway protection finally was extubated on 03/24/2021.  Evaluated by psychiatry.  They recommend inpatient psych admission pending medical clearance.  Patient is medically cleared  for transfer to behavioral health.  TOC aware and working with behavioral health for bed.  She was reassessed by psychiatry today and they maintained their recommendation for inpatient psychiatry admission.  Continue following medications per psychiatry. Amantadine 100 mg twice daily Haldol 5 mg nightly and 2 mg daily. Cogentin 1 mg at bedtime.  Refusal to eat/dysphagia: Evaluated by nutrition, she has around 52% food intake.  They recommend removing tube. cortrak  Tube removed today.  Hypertension: Blood pressure very well controlled.  Continue amlodipine, losartan and hydrochlorothiazide and as needed labetalol.  History of hypothyroidism: Continue propranolol and methimazole.  DVT prophylaxis: enoxaparin (LOVENOX) injection 40 mg Start: 03/19/21 1000 Place and maintain sequential compression device Start: 03/18/21 1850   Code Status: Full Code  Family Communication:  None present at bedside.   Status is: Inpatient  Remains inpatient appropriate because:Unsafe d/c plan   Dispo: The patient is from: Home              Anticipated d/c is to: [facility]              Patient currently is medically stable to d/c.   Difficult to place patient No        Estimated body mass index is 23.08 kg/m as calculated from the following:   Height as of this encounter: 5\' 3"  (1.6 m).   Weight as of this encounter: 59.1 kg.      Nutritional status:  Nutrition Problem: Moderate Malnutrition Etiology: social / environmental circumstances   Signs/Symptoms: severe muscle depletion,moderate muscle depletion,moderate fat depletion   Interventions: Prostat,Tube feeding    Consultants:   Psychiatry  Procedures:   None  Antimicrobials:  Anti-infectives (From admission, onward)  None         Subjective: Seen and examined.  Sitting in the chair.  No complaints.  Objective: Vitals:   03/29/21 0500 03/29/21 0720 03/29/21 0759 03/29/21 1136  BP:   102/71 120/88  Pulse:   95 88   Resp:   18 14  Temp:   98.3 F (36.8 C) 99.1 F (37.3 C)  TempSrc:   Oral Oral  SpO2:  96% 98% 100%  Weight: 59.1 kg     Height:        Intake/Output Summary (Last 24 hours) at 03/29/2021 1334 Last data filed at 03/29/2021 1045 Gross per 24 hour  Intake 477 ml  Output --  Net 477 ml   Filed Weights   03/26/21 0500 03/28/21 0500 03/29/21 0500  Weight: 59 kg 60.9 kg 59.1 kg    Examination: General exam: Appears calm and comfortable  Respiratory system: Clear to auscultation. Respiratory effort normal. Cardiovascular system: S1 & S2 heard, RRR. No JVD, murmurs, rubs, gallops or clicks. No pedal edema. Gastrointestinal system: Abdomen is nondistended, soft and nontender. No organomegaly or masses felt. Normal bowel sounds heard. Central nervous system: Alert and oriented. No focal neurological deficits. Extremities: Symmetric 5 x 5 power. Skin: No rashes, lesions or ulcers.  Psychiatry: Judgement and insight appear poor.   Data Reviewed: I have personally reviewed following labs and imaging studies  CBC: Recent Labs  Lab 03/23/21 0437 03/24/21 0305  WBC 7.8 9.8  HGB 11.3* 10.3*  HCT 35.9* 32.5*  MCV 90.0 88.3  PLT 245 786   Basic Metabolic Panel: Recent Labs  Lab 03/23/21 0437 03/24/21 0305 03/25/21 1418 03/26/21 0557 03/26/21 1653  NA 149* 149*  --   --   --   K 4.6 4.1  --   --   --   CL 113* 114*  --   --   --   CO2 26 28  --   --   --   GLUCOSE 180* 114*  --   --   --   BUN 25* 28*  --   --   --   CREATININE 0.95 0.86  --   --   --   CALCIUM 10.0 9.9  --   --   --   MG 2.1 2.2 2.0 1.9 1.8  PHOS 3.1 2.9 2.4* 2.3* 3.6   GFR: Estimated Creatinine Clearance: 59.7 mL/min (by C-G formula based on SCr of 0.86 mg/dL). Liver Function Tests: No results for input(s): AST, ALT, ALKPHOS, BILITOT, PROT, ALBUMIN in the last 168 hours. No results for input(s): LIPASE, AMYLASE in the last 168 hours. No results for input(s): AMMONIA in the last 168  hours. Coagulation Profile: No results for input(s): INR, PROTIME in the last 168 hours. Cardiac Enzymes: No results for input(s): CKTOTAL, CKMB, CKMBINDEX, TROPONINI in the last 168 hours. BNP (last 3 results) No results for input(s): PROBNP in the last 8760 hours. HbA1C: Recent Labs    03/28/21 0819  HGBA1C 5.8*   CBG: Recent Labs  Lab 03/28/21 0927 03/28/21 1219 03/28/21 1615 03/28/21 1949 03/29/21 0755  GLUCAP 158* 119* 113* 101* 167*   Lipid Profile: No results for input(s): CHOL, HDL, LDLCALC, TRIG, CHOLHDL, LDLDIRECT in the last 72 hours. Thyroid Function Tests: No results for input(s): TSH, T4TOTAL, FREET4, T3FREE, THYROIDAB in the last 72 hours. Anemia Panel: No results for input(s): VITAMINB12, FOLATE, FERRITIN, TIBC, IRON, RETICCTPCT in the last 72 hours. Sepsis Labs: No results for input(s): PROCALCITON, LATICACIDVEN in  the last 168 hours.  No results found for this or any previous visit (from the past 240 hour(s)).    Radiology Studies: No results found.  Scheduled Meds: . amantadine  100 mg Per Tube BID  . amLODipine  10 mg Per Tube Daily  . benztropine  1 mg Per Tube QHS  . Chlorhexidine Gluconate Cloth  6 each Topical Q0600  . dexamethasone (DECADRON) injection  6 mg Intravenous Q12H  . enoxaparin (LOVENOX) injection  40 mg Subcutaneous Q24H  . feeding supplement  237 mL Oral TID BM  . haloperidol  2 mg Per Tube Daily  . haloperidol  5 mg Per Tube QHS  . hydrochlorothiazide  25 mg Per Tube Daily  . insulin aspart  0-15 Units Subcutaneous Q4H  . losartan  25 mg Per Tube Daily  . mouth rinse  15 mL Mouth Rinse BID  . methimazole  10 mg Per Tube BID  . multivitamin with minerals  1 tablet Oral Daily  . pantoprazole sodium  40 mg Per Tube Daily  . polyethylene glycol  17 g Per Tube Daily  . propranolol  20 mg Per Tube BID  . senna-docusate  1 tablet Per Tube BID  . traZODone  100 mg Per Tube QHS   Continuous Infusions: . dextrose 5 % and 0.45%  NaCl 75 mL/hr at 03/28/21 0548     LOS: 11 days   Time spent: 25 minutes   Darliss Cheney, MD [facility]  03/29/2021, 1:34 PM   How to contact the [facility] Attending or Consulting provider [place] or covering provider during after hours [place], for this patient?  1. Check the care team in [facility] and look for a) attending/consulting [facility] provider listed and b) the [facility] team listed. Page or secure chat [phone]. 2. Log into [email or web address] and use [place]'s universal password to access. If you do not have the password, please contact the hospital operator. 3. Locate the [facility] provider you are looking for under [facility] and page to a number that you can be directly reached. 4. If you still have difficulty reaching the provider, please page the [facility] (Director on Call) for the Hospitalists listed on amion for assistance.

## 2021-03-29 NOTE — Progress Notes (Signed)
Patient declined at [facility] at this time.  Assunta Curtis, MSW, LCSW 03/29/2021 12:39 PM

## 2021-03-29 NOTE — Progress Notes (Signed)
Cortrak removed at this time per order. Patient tolerated removal well. NAD noted.

## 2021-03-29 NOTE — Progress Notes (Signed)
Nutrition Follow-up  DOCUMENTATION CODES:   Non-severe (moderate) malnutrition in context of social or environmental circumstances  INTERVENTION:   Recommend d/c cortrak and tube feeding   Continue Ensure Enlive po TID, each supplement provides 350 kcal and 20 grams of protein  Continue Magic cup TID with meals, each supplement provides 290 kcal and 9 grams of protein  Continue MVI with minerals daily   NUTRITION DIAGNOSIS:   Moderate Malnutrition related to social / environmental circumstances as evidenced by severe muscle depletion,moderate muscle depletion,moderate fat depletion.  ongoing  GOAL:   Patient will meet greater than or equal to 90% of their needs  progressing  MONITOR:   PO intake,TF tolerance,Diet advancement  REASON FOR ASSESSMENT:   Ventilator,Consult Enteral/tube feeding initiation and management  ASSESSMENT:   55 year old female who presented to the ED on 4/22 after being found unresponsive by family surrounded by multiple pill bottles. PMH of hypothyroidism, PAF, bipolar disorder, schizophrenia, HTN. Pt intubated in the ED and admitted with intentional overdose.   4/22 intubated; OG placed 4/23 TF initiated 4/26 extubated and reintubated 4/28 extubated; OG removed 4/29 Cortrak placed (gastric)  TF was held yesterday and overnight to observe adequacy of pt's intake. Pt ate 30% of breakfast and lunch, then 75% of dinner. Discussed pt with RN who reports pt drank 100% of supplements. Given good supplement consumption and average meal intake of ~52% since diet was advanced to regular on 4/30, recommend Cortrak be removed and pt continue with oral nutrition supplements. Discussed with MD.  UOP: 1435ml documented x24 hours  Admit weight: 60 kg Current weight: 59.1 kg  Medications: decadron, SSI Q4H, mvi with minerals, protonix, miralax, senokot-s Labs reviewed. CBGs 101-167  Diet Order:   Diet Order            Diet regular Room service  appropriate? Yes; Fluid consistency: Thin  Diet effective now                 EDUCATION NEEDS:   No education needs have been identified at this time  Skin:  Skin Assessment: Reviewed RN Assessment  Last BM:  5/2  Height:   Ht Readings from Last 1 Encounters:  03/22/21 5\' 3"  (1.6 m)    Weight:   Wt Readings from Last 1 Encounters:  03/29/21 59.1 kg   BMI:  Body mass index is 23.08 kg/m.  Estimated Nutritional Needs:   Kcal:  1700-1900  Protein:  90-110 grams  Fluid:  >1.7 L/day    Larkin Ina, MS, RD, LDN RD pager number and weekend/on-call pager number located in [place].

## 2021-03-29 NOTE — Consult Note (Signed)
[facility] Consult   Reason for Consult: Suicide Attempt Referring Physician:  Dr. Doristine Bosworth Patient Identification: Paula Dickson MRN:  [id number] Principal Diagnosis: Schizophrenia ([place]) Diagnosis:  Principal Problem:   Schizophrenia ([place]) Active Problems:   Overdose   Respiratory failure ([place])   Encounter for intubation   Encephalopathy acute   Malnutrition of moderate degree   Total Time spent with patient: 30 minutes  Subjective:   Paula Dickson is a 55 y.o. female with past medical history significant for Schizoaffective disorder bipolar type, hypertension and hypothyroidism found unresponsive on the floor.  Patient was found with gurgling respirations and surrounded by pill bottles.(Medications from home brought with the patient at bedside -Tegretol, lorazepam 1 mg, Klonopin, amlodipine 10 mg Haldol 2 mg). Most of the benzodiazepines were gone.   Psych consult was placed for suicide attempt. Patient is seen and assessed by psychiatry today, not much improvement in her clinical presentation since yesterday. On evaluation she continues to remain preoccupied on her medications, as she is observed to be refusing and negotiating certain medications with her nurses. Patient is encouraged to take all of her medications, however she continued to refuse Methimazole, Insulin, Steroids, and Losartan. Patient exhibits some paranoia and fear associated with taking her medications and states " Where was it manufactured? Who made it? When should I take that? I have never taken that medication before! That medication interacts with my other medications. " She is observed to be sitting upright in the bedside chair, drinking Ensure, unclothed. SHe is congratulated on her progress to increase her oral intake as this will lead to her feeding tube being removed. She denies any suicidal ideations today. She denies any hallucinations today. She appears to be answering all questions  appropriately. Patient continues to meet inpatient criteria at this time for medication stabilization and optimization of psychiatric medications. Patient has required multiple inpatient admissions, that may pose a difficult with her remaining independent in the home.      Past Psychiatric History: Schizoaffective disorder bipolar type- See H&P Trial and failed multiple psychotropic medication to include Haldol, risperidone, Haldol decanoate, Cogentin, carbamazepine, clozapine, hydroxyzine, temazepam.   Risk to Self:   Yes Risk to Others:  No Prior Inpatient Therapy:  Yes Prior Outpatient Therapy:  No  Past Medical History:  Past Medical History:  Diagnosis Date  . Bipolar disorder ([place])   . Hypertension   . Schizophrenia ([place])     Schizoaffective Bipolar type, Pt has 2 charts. Family History: No family history on file. Family Psychiatric  History: Brother- Bipolar Social History:  Social History   Substance and Sexual Activity  Alcohol Use Not Currently     Social History   Substance and Sexual Activity  Drug Use Not Currently    Social History   Socioeconomic History  . Marital status: Single    Spouse name: Not on file  . Number of children: Not on file  . Years of education: Not on file  . Highest education level: Not on file  Occupational History  . Not on file  Tobacco Use  . Smoking status: Never Smoker  . Smokeless tobacco: Never Used  Vaping Use  . Vaping Use: Never used  Substance and Sexual Activity  . Alcohol use: Not Currently  . Drug use: Not Currently  . Sexual activity: Not on file  Other Topics Concern  . Not on file  Social History Narrative  . Not on file   Social Determinants of Health  Financial Resource Strain: Not on file  Food Insecurity: Not on file  Transportation Needs: Not on file  Physical Activity: Not on file  Stress: Not on file  Social Connections: Not on file   Additional Social History:    Allergies:  No Known  Allergies  Labs:  Results for orders placed or performed during the hospital encounter of 03/18/21 (from the past 48 hour(s))  Glucose, capillary     Status: Abnormal   Collection Time: 03/27/21 12:09 PM  Result Value Ref Range   Glucose-Capillary 168 (H) 70 - 99 mg/dL    Comment: Glucose reference range applies only to samples taken after fasting for at least 8 hours.  Glucose, capillary     Status: Abnormal   Collection Time: 03/27/21  3:43 PM  Result Value Ref Range   Glucose-Capillary 179 (H) 70 - 99 mg/dL    Comment: Glucose reference range applies only to samples taken after fasting for at least 8 hours.  Glucose, capillary     Status: Abnormal   Collection Time: 03/27/21  8:12 PM  Result Value Ref Range   Glucose-Capillary 170 (H) 70 - 99 mg/dL    Comment: Glucose reference range applies only to samples taken after fasting for at least 8 hours.  Glucose, capillary     Status: Abnormal   Collection Time: 03/27/21 11:03 PM  Result Value Ref Range   Glucose-Capillary 145 (H) 70 - 99 mg/dL    Comment: Glucose reference range applies only to samples taken after fasting for at least 8 hours.  Glucose, capillary     Status: Abnormal   Collection Time: 03/28/21  3:14 AM  Result Value Ref Range   Glucose-Capillary 205 (H) 70 - 99 mg/dL    Comment: Glucose reference range applies only to samples taken after fasting for at least 8 hours.  Glucose, capillary     Status: Abnormal   Collection Time: 03/28/21  7:45 AM  Result Value Ref Range   Glucose-Capillary 177 (H) 70 - 99 mg/dL    Comment: Glucose reference range applies only to samples taken after fasting for at least 8 hours.  Hemoglobin A1c     Status: Abnormal   Collection Time: 03/28/21  8:19 AM  Result Value Ref Range   Hgb A1c MFr Bld 5.8 (H) 4.8 - 5.6 %    Comment: (NOTE) Pre diabetes:          5.7%-6.4%  Diabetes:              >6.4%  Glycemic control for   <7.0% adults with diabetes    Mean Plasma Glucose 119.76  mg/dL    Comment: Performed at [facility] [address], [place], [place] [address]  Glucose, capillary     Status: Abnormal   Collection Time: 03/28/21  9:27 AM  Result Value Ref Range   Glucose-Capillary 158 (H) 70 - 99 mg/dL    Comment: Glucose reference range applies only to samples taken after fasting for at least 8 hours.  Glucose, capillary     Status: Abnormal   Collection Time: 03/28/21 12:19 PM  Result Value Ref Range   Glucose-Capillary 119 (H) 70 - 99 mg/dL    Comment: Glucose reference range applies only to samples taken after fasting for at least 8 hours.  Glucose, capillary     Status: Abnormal   Collection Time: 03/28/21  4:15 PM  Result Value Ref Range   Glucose-Capillary 113 (H) 70 - 99 mg/dL  Comment: Glucose reference range applies only to samples taken after fasting for at least 8 hours.  Glucose, capillary     Status: Abnormal   Collection Time: 03/28/21  7:49 PM  Result Value Ref Range   Glucose-Capillary 101 (H) 70 - 99 mg/dL    Comment: Glucose reference range applies only to samples taken after fasting for at least 8 hours.  Glucose, capillary     Status: Abnormal   Collection Time: 03/29/21  7:55 AM  Result Value Ref Range   Glucose-Capillary 167 (H) 70 - 99 mg/dL    Comment: Glucose reference range applies only to samples taken after fasting for at least 8 hours.    Current Facility-Administered Medications  Medication Dose Route Frequency Provider Last Rate Last Admin  . amantadine (SYMMETREL) 50 MG/5ML solution 100 mg  100 mg Per Tube BID Simonne Maffucci B, MD   100 mg at 03/28/21 2255  . amLODipine (NORVASC) tablet 10 mg  10 mg Per Tube Daily Juanito Doom, MD   10 mg at 03/29/21 0957  . benztropine (COGENTIN) tablet 1 mg  1 mg Per Tube QHS Simonne Maffucci B, MD   1 mg at 03/28/21 2252  . Chlorhexidine Gluconate Cloth 2 % PADS 6 each  6 each Topical Q0600 Chesley Mires, MD   6 each at 03/27/21 [id number]  . dexamethasone (DECADRON)  injection 6 mg  6 mg Intravenous Q12H Simonne Maffucci B, MD   6 mg at 03/28/21 1000  . dextrose 5 %-0.45 % sodium chloride infusion   Intravenous Continuous Juanito Doom, MD 75 mL/hr at 03/28/21 0548 New Bag at 03/28/21 0548  . docusate sodium (COLACE) capsule 100 mg  100 mg Oral Daily PRN Kipp Brood, MD   100 mg at 03/27/21 2155  . enoxaparin (LOVENOX) injection 40 mg  40 mg Subcutaneous Q24H Chesley Mires, MD   40 mg at 03/28/21 1008  . feeding supplement (ENSURE ENLIVE / ENSURE PLUS) liquid 237 mL  237 mL Oral TID BM Darliss Cheney, MD   237 mL at 03/29/21 1008  . feeding supplement (OSMOLITE 1.2 CAL) liquid 1,000 mL  1,000 mL Per Tube Continuous Juanito Doom, MD 60 mL/hr at 03/25/21 1804 Infusion Verify at 03/25/21 1804  . feeding supplement (PROSource TF) liquid 45 mL  45 mL Per Tube BID Simonne Maffucci B, MD   45 mL at 03/29/21 1008  . haloperidol (HALDOL) tablet 2 mg  2 mg Per Tube Daily Simonne Maffucci B, MD   2 mg at 03/29/21 1001  . haloperidol (HALDOL) tablet 5 mg  5 mg Per Tube QHS Juanito Doom, MD   5 mg at 03/28/21 2252  . hydrochlorothiazide (HYDRODIURIL) tablet 25 mg  25 mg Per Tube Daily Juanito Doom, MD   25 mg at 03/28/21 0956  . insulin aspart (novoLOG) injection 0-15 Units  0-15 Units Subcutaneous Q4H Chesley Mires, MD   3 Units at 03/28/21 1012  . labetalol (NORMODYNE) injection 10 mg  10 mg Intravenous Q10 min PRN Kipp Brood, MD   10 mg at 03/25/21 0750  . losartan (COZAAR) tablet 25 mg  25 mg Per Tube Daily Simonne Maffucci B, MD   25 mg at 03/29/21 1007  . MEDLINE mouth rinse  15 mL Mouth Rinse BID Agarwala, Ravi, MD   15 mL at 03/28/21 1014  . methimazole (TAPAZOLE) tablet 10 mg  10 mg Per Tube BID Simonne Maffucci B, MD   10 mg at 03/29/21 1003  .  multivitamin with minerals tablet 1 tablet  1 tablet Oral Daily Darliss Cheney, MD   1 tablet at 03/28/21 1018  . pantoprazole sodium (PROTONIX) 40 mg/20 mL oral suspension 40 mg  40 mg Per Tube  Daily Simonne Maffucci B, MD   40 mg at 03/28/21 1013  . polyethylene glycol (MIRALAX / GLYCOLAX) packet 17 g  17 g Per Tube Daily Simonne Maffucci B, MD   17 g at 03/25/21 1311  . propranolol (INDERAL) tablet 20 mg  20 mg Per Tube BID Simonne Maffucci B, MD   20 mg at 03/29/21 1005  . senna-docusate (Senokot-S) tablet 1 tablet  1 tablet Per Tube BID Juanito Doom, MD   1 tablet at 03/28/21 2253  . traZODone (DESYREL) tablet 100 mg  100 mg Per Tube QHS Juanito Doom, MD   100 mg at 03/28/21 2251    Musculoskeletal: Strength & Muscle Tone: within normal limits Gait & Station: normal, unable to stand Patient leans: N/A Psychiatric Specialty Exam:  Presentation  General Appearance: Appropriate for Environment; Disheveled (unclothed)  Eye Contact:Fair  Speech:Clear and Coherent; Normal Rate  Speech Volume:Normal  Handedness:Right   Mood and Affect  Mood:Anxious; Euphoric  Affect:Appropriate; Congruent   Thought Process  Thought Processes:Irrevelant; Linear  Descriptions of Associations:Tangential  Orientation:Partial  Thought Content:Rumination; Tangential; Perseveration; Scattered  History of Schizophrenia/Schizoaffective disorder:Yes  Duration of Psychotic Symptoms:Greater than six months  Hallucinations:Hallucinations: None Description of Auditory Hallucinations: Unable to describ Description of Visual Hallucinations: Unable to describe  Ideas of Reference:None  Suicidal Thoughts:Suicidal Thoughts: No  Homicidal Thoughts:Homicidal Thoughts: No   Sensorium  Memory:Recent Fair; Immediate Fair; Recent Poor; Remote Poor  Judgment:Poor  Insight:Lacking   Executive Functions  Concentration:Fair  Attention Span:Fair  Recall:Poor  Fund of Knowledge:Poor  Language:Poor   Psychomotor Activity  Psychomotor Activity:Psychomotor Activity: Tremor AIMS Completed?: No   Assets  Assets:Housing; Social Support; Physical Health   Sleep   Sleep:Sleep: Poor   Physical Exam: Physical Exam Vitals and nursing note reviewed.  Constitutional:      General: She is not in acute distress.    Appearance: She is not ill-appearing, toxic-appearing or diaphoretic.  HENT:     Head: Normocephalic.  Pulmonary:     Effort: Pulmonary effort is normal.  Neurological:     General: No focal deficit present.     Mental Status: She is alert.  Psychiatric:        Attention and Perception: She is inattentive. She does not perceive auditory or visual hallucinations.        Mood and Affect: Mood normal.        Behavior: Behavior normal. Behavior is cooperative.        Thought Content: Thought content normal.        Cognition and Memory: Cognition normal.        Judgment: Judgment normal.    Review of Systems  Psychiatric/Behavioral: Positive for memory loss. Negative for hallucinations and suicidal ideas. The patient is nervous/anxious.   All other systems reviewed and are negative.    Blood pressure 102/71, pulse 95, temperature 98.3 F (36.8 C), temperature source Oral, resp. rate 18, height 5\' 3"  (1.6 m), weight 59.1 kg, SpO2 98 %. Body mass index is 23.08 kg/m.  Treatment Plan Summary: No changes at this time. Patient meets criteria for inpatient hospitalization to [facility].  -Continue to recommend inpatient admission. If no appropriate beds at [facility] please refer out of system. Continue facilitating referrals with SW.   -Continue  amantadine 100 mg twice daily. -COntinue current dose of Haldol to 5 mg nightly and Continue Haldol 2 mg daily. -Continue Cogentin 1 mg at bedtime.  Disposition: Recommend psychiatric Inpatient admission when medically cleared.  Suella Broad, FNP 03/29/2021 10:25 AM

## 2021-03-29 NOTE — TOC Progression Note (Addendum)
Transition of Care [facility]) - Progression Note    Patient Details  Name: Paula Dickson MRN: [id number] Date of Birth: [birth year]  [facility] CM/SW Contact  Joanne Chars, LCSW Phone Number: 03/29/2021, 3:35 PM  Clinical Narrative:   CSW received call from [place] at [facility] asking about pt feeding tube, per MD will be removed today.  This was confirmed ant CSW LM for [facility] at 1530 that feeding tube was out.  Per Erasmo Downer [facility] unable to accept pt, DR Dwyane Dee asking that pt be referred to [facility].  Per Demetria at [facility], no beds today but potential beds tomorrow.  Will review.    CSW spoke with pt about plan to transfer to inpatient psychiatric facility, pt asked where she would go, initially said that she only wanted to go back to [facility], then said she would rather stay right here where she is.  CSW engaged her briefly regarding the fact that she will be transferred to another location and that it may be outside of [place], pt would only say that she did not want to do this.     Expected Discharge Plan: Psychiatric Hospital Barriers to Discharge: Continued Medical Work up  Expected Discharge Plan and Services Expected Discharge Plan: [facility]                                               Social Determinants of Health (SDOH) Interventions    Readmission Risk Interventions No flowsheet data found.

## 2021-03-30 DIAGNOSIS — Z01818 Encounter for other preprocedural examination: Secondary | ICD-10-CM | POA: Diagnosis not present

## 2021-03-30 DIAGNOSIS — E440 Moderate protein-calorie malnutrition: Secondary | ICD-10-CM

## 2021-03-30 DIAGNOSIS — J9601 Acute respiratory failure with hypoxia: Secondary | ICD-10-CM | POA: Diagnosis not present

## 2021-03-30 LAB — GLUCOSE, CAPILLARY
Glucose-Capillary: 127 mg/dL — ABNORMAL HIGH (ref 70–99)
Glucose-Capillary: 106 mg/dL — ABNORMAL HIGH (ref 70–99)

## 2021-03-30 MED ORDER — PROPRANOLOL HCL 20 MG PO TABS: 20.0000 mg | ORAL_TABLET | Freq: Two times a day (BID) | ORAL | 0 refills | Status: DC

## 2021-03-30 MED ORDER — LOSARTAN POTASSIUM 25 MG PO TABS: 25.0000 mg | ORAL_TABLET | Freq: Every day | ORAL | Status: DC

## 2021-03-30 MED ORDER — HALOPERIDOL 2 MG PO TABS: 2.0000 mg | ORAL_TABLET | Freq: Every day | ORAL | 0 refills | Status: DC

## 2021-03-30 MED ORDER — LOSARTAN POTASSIUM 25 MG PO TABS: 25.0000 mg | ORAL_TABLET | Freq: Every day | ORAL | 0 refills | Status: DC

## 2021-03-30 MED ORDER — HALOPERIDOL LACTATE 5 MG/ML IJ SOLN: 2.0000 mg | Freq: Four times a day (QID) | INTRAMUSCULAR | Status: DC | PRN

## 2021-03-30 MED ORDER — HYDROCHLOROTHIAZIDE 25 MG PO TABS: 25.0000 mg | ORAL_TABLET | Freq: Every day | ORAL | Status: DC

## 2021-03-30 MED ORDER — METHIMAZOLE 5 MG PO TABS: 10.0000 mg | ORAL_TABLET | Freq: Two times a day (BID) | ORAL | Status: DC

## 2021-03-30 MED ORDER — AMLODIPINE BESYLATE 10 MG PO TABS: 10.0000 mg | ORAL_TABLET | Freq: Every day | ORAL | Status: DC

## 2021-03-30 MED ORDER — TRAZODONE HCL 100 MG PO TABS: 100.0000 mg | ORAL_TABLET | Freq: Every day | ORAL | 0 refills | Status: DC

## 2021-03-30 MED ORDER — PANTOPRAZOLE SODIUM 40 MG PO TBEC: 40.0000 mg | DELAYED_RELEASE_TABLET | Freq: Every day | ORAL | 0 refills | Status: DC

## 2021-03-30 MED ORDER — BENZTROPINE MESYLATE 1 MG PO TABS: 1.0000 mg | ORAL_TABLET | Freq: Every day | ORAL | Status: DC

## 2021-03-30 MED ORDER — HYDROCHLOROTHIAZIDE 25 MG PO TABS: 25.0000 mg | ORAL_TABLET | Freq: Every day | ORAL | 0 refills | Status: DC

## 2021-03-30 MED ORDER — PANTOPRAZOLE SODIUM 40 MG PO TBEC: 40.0000 mg | DELAYED_RELEASE_TABLET | Freq: Every day | ORAL | Status: DC

## 2021-03-30 MED ORDER — PROPRANOLOL HCL 40 MG PO TABS: 20.0000 mg | ORAL_TABLET | Freq: Two times a day (BID) | ORAL | Status: DC

## 2021-03-30 MED ORDER — HALOPERIDOL 1 MG PO TABS: 5.0000 mg | ORAL_TABLET | Freq: Every day | ORAL | Status: DC

## 2021-03-30 MED ORDER — AMANTADINE HCL 50 MG/5ML PO SOLN
100.0000 mg | Freq: Two times a day (BID) | ORAL | Status: DC
  Filled 2021-03-30 (×2): qty 10

## 2021-03-30 MED ORDER — SENNOSIDES-DOCUSATE SODIUM 8.6-50 MG PO TABS: 1.0000 | ORAL_TABLET | Freq: Two times a day (BID) | ORAL | Status: DC

## 2021-03-30 MED ORDER — HALOPERIDOL 1 MG PO TABS: 2.0000 mg | ORAL_TABLET | Freq: Every day | ORAL | Status: DC

## 2021-03-30 MED ORDER — TRAZODONE HCL 100 MG PO TABS: 100.0000 mg | ORAL_TABLET | Freq: Every day | ORAL | Status: DC

## 2021-03-30 NOTE — Progress Notes (Signed)
The patient is agitated and she has refused all medications. She is not redirectable. In addition, she has pulled out her IV. MD aware.

## 2021-03-30 NOTE — Progress Notes (Signed)
CSW followed up with Adela Ports and updated that per Jobe Gibbon, the pts feeding tube has been removed.    CSW asked for Jeanett Schlein to follow up with the pt's nurse and provided the contact information.  Assunta Curtis, MSW, LCSW 03/30/2021 1:00 PM

## 2021-03-30 NOTE — Progress Notes (Signed)
Patient meets inpatient criteria per Sheran Fava, NP.  No appropriate beds available at [facility], pt under review at [facility].  Patient was referred to the following facilities:    Service Provider Address Phone Fax  [facility]  [address] [place] [address] [phone]  [facility]  [address] [place] [address] [phone]  [facility]  [place], [place] [address] [phone]  [facility]  [address], [place] [address] [phone]  [facility]  [address] [place] [address] [phone]  [facility]  [address] [place] [address] [place]  [name]  [address] [place] [address] [phone]  [facility]  [address] [place] [address] [phone]  [facility]  [address] [place] [place] [address] [place]  [facility]  [address], [place] [address] [phone] [place]  [address], [place] [address] [phone]  [facility]  [address], [place] [address] [phone] [place] [place] [address] [phone] [facility]  [place], [place] [address] [phone] [place]  [address], [place] [address] [phone] [facility]  [address], [place] [address] [phone]  [facility]  [address] [place] [address] [phone]       CSW will continue to monitor for disposition.  Assunta Curtis, MSW, LCSW 03/30/2021 11:10 AM

## 2021-03-30 NOTE — Discharge Summary (Signed)
Physician Discharge Summary  Paula Dickson YPP:[id number] DOB: 1966-01-03 DOA: 03/18/2021  PCP: Merryl Hacker, No  Admit date: 03/18/2021 Discharge date: 03/30/2021  Admitted From: Home Disposition:  Inpatient Psychiatry  Recommendations for Outpatient Follow-up:  1. Follow up with PCP in 1-2 weeks  Discharge Condition:Stable CODE STATUS:Full Diet recommendation: Regular   Brief/Interim Summary: 55 yo female with history of bipolar disorder, schizophrenia, hypothyroidism and hypertension found at home by family member unresponsive on the floor. EMS called and patient found with gurgling respirations and surrounded by pill bottles. Required intubation for airway protection. Family did not come to ER with patient. Last known normal time was in evening of 03/17/21. Had recent admission for hypernatremia and AKI. Has hx of medical non-compliance.  Patient was intubated in the ED for airway protection.  She was extubated on 4/58/0998 complicated by stridor requiring reintubation and initiation of Decadron.  She was subsequently extubated again on 03/24/2021 and did not pass nursing bedside swallow screen. SLP consulted and patient was able to swallow thicker liquids. Continued to have episodes of agitation and restlessness accompanied by HBPs to 190s/100s and started on amlodipine, HCTZ and PRN labetalol.  Evaluated by psychiatry who recommended inpatient psychiatry admission.  She refused to eat. cortrak was placed and she was started on tube feedings.  Subsequently transferred under [facility] care on 03/26/2021.  Awaiting bed availability at behavioral health.  She has progressed as far as her feeding goes, evaluated by nutrition, they recommend removing tube  Discharge Diagnoses:  Principal Problem:   Schizophrenia ([place]) Active Problems:   Overdose   Respiratory failure ([place])   Encounter for intubation   Encephalopathy acute   Malnutrition of moderate degree  Intentional overdose with benzodiazepine and  Tegretol with history of bipolar disorder, paranoid schizophrenia: Patient required intubation for airway protection finally was extubated on 03/24/2021.  Evaluated by psychiatry.  They recommend inpatient psych admission pending medical clearance.  Patient is medically cleared for transfer to behavioral health.  Social Work had been following.  She was reassessed by psychiatry and they maintained their recommendation for inpatient psychiatry admission.  Continue following medications per psychiatry. Amantadine 100 mg twice daily Haldol 5 mg nightly and 2 mg daily. Cogentin 1 mg at bedtime.  Refusal to eat/dysphagia: Evaluated by nutrition, she has around 52% food intake.  Coretrak was successfully removed  Hypertension: Blood pressure very well controlled.  Continue amlodipine, losartan and hydrochlorothiazide and as needed labetalol.  History of hypothyroidism: Continue propranolol and methimazole.   Discharge Instructions   Allergies as of 03/30/2021   No Known Allergies     Medication List    STOP taking these medications   carbamazepine 100 MG chewable tablet Commonly known as: TEGRETOL   carvedilol 12.5 MG tablet Commonly known as: COREG   cloZAPine 100 MG tablet Commonly known as: CLOZARIL   cloZAPine 25 MG tablet Commonly known as: CLOZARIL   haloperidol decanoate 100 MG/ML injection Commonly known as: HALDOL DECANOATE   LORazepam 1 MG tablet Commonly known as: ATIVAN   temazepam 30 MG capsule Commonly known as: RESTORIL     TAKE these medications   amantadine 100 MG capsule Commonly known as: SYMMETREL Take 100 mg by mouth 2 (two) times daily.   amLODipine 10 MG tablet Commonly known as: NORVASC Take 1 tablet by mouth daily.   benztropine 1 MG tablet Commonly known as: COGENTIN Take 1 mg by mouth at bedtime.   haloperidol 2 MG tablet Commonly known as: HALDOL Take 1 tablet (2  mg total) by mouth daily. What changed:   when to take this  Another  medication with the same name was removed. Continue taking this medication, and follow the directions you see here.   hydrochlorothiazide 25 MG tablet Commonly known as: HYDRODIURIL Take 1 tablet (25 mg total) by mouth daily.   losartan 25 MG tablet Commonly known as: COZAAR Take 1 tablet (25 mg total) by mouth daily.   methimazole 10 MG tablet Commonly known as: TAPAZOLE Take 10 mg by mouth 2 (two) times daily.   pantoprazole 40 MG tablet Commonly known as: PROTONIX Take 1 tablet (40 mg total) by mouth daily.   propranolol 20 MG tablet Commonly known as: INDERAL Take 1 tablet (20 mg total) by mouth 2 (two) times daily. What changed:   medication strength  how much to take   traZODone 100 MG tablet Commonly known as: DESYREL Take 1 tablet (100 mg total) by mouth at bedtime. What changed:   medication strength  how much to take       Follow-up Information    Follow up with your PCP when discharged. Schedule an appointment as soon as possible for a visit in 2 week(s).              No Known Allergies  Consultations:  Psychiatry  PCCM  Procedures/Studies: CT Head Wo Contrast  Result Date: 03/18/2021 CLINICAL DATA:  Transient ischemic attack (TIA) Found unresponsive. EXAM: CT HEAD WITHOUT CONTRAST TECHNIQUE: Contiguous axial images were obtained from the base of the skull through the vertex without intravenous contrast. COMPARISON:  Head CT 04/30/2020 FINDINGS: Brain: Mild motion artifact limitations. No intracranial hemorrhage, mass effect, or midline shift. No hydrocephalus. The basilar cisterns are patent. No sulcal effacement. No definite gray-white differentiation loss. No evidence of territorial infarct or acute ischemia. No extra-axial or intracranial fluid collection. Vascular: No hyperdense vessel. Skull: No fracture or focal lesion. Sinuses/Orbits: Mucosal thickening of scattered ethmoid air cells, chronic and mildly exacerbated by intubation. Mastoid air  cells are clear. No acute orbital findings. Other: Endotracheal tube in place. IMPRESSION: No acute intracranial abnormality. Electronically Signed   By: Keith Rake M.D.   On: 03/18/2021 20:12   DG CHEST PORT 1 VIEW  Result Date: 03/22/2021 CLINICAL DATA:  Intubation. EXAM: PORTABLE CHEST 1 VIEW COMPARISON:  03/20/2021 FINDINGS: Endotracheal tube tip 19 mm from the carina. Enteric tube in place with tip and side-port below the diaphragm in the stomach. Hazy opacity at the right lung base is new from prior exam. There is worsening left retrocardiac opacity. Normal heart size. No pulmonary edema. No pneumothorax. IMPRESSION: 1. Endotracheal tube tip 19 mm from the carina. Enteric tube in place with tip and side-port below the diaphragm in the stomach. 2. New hazy right lung base opacity may represent atelectasis, effusion, or combination there of. Worsening left basilar aeration with increasing retrocardiac opacity. Electronically Signed   By: Keith Rake M.D.   On: 03/22/2021 16:34   DG Chest Port 1 View  Result Date: 03/20/2021 CLINICAL DATA:  Respiratory failure EXAM: PORTABLE CHEST 1 VIEW COMPARISON:  03/19/2021 FINDINGS: Endotracheal tube terminates approximately 3.5 cm above the carina. Enteric tube courses below the diaphragm with distal tip beyond the inferior margin of the film. Normal heart size. Continued improvement in the aeration of the left lower lobe with mild residual streaky opacity. Right lung is clear. No pleural effusion or pneumothorax. IMPRESSION: Continued improvement in aeration of the left lower lobe with mild residual streaky opacity.  Electronically Signed   By: Davina Poke D.O.   On: 03/20/2021 09:21   DG Chest Port 1 View  Result Date: 03/19/2021 CLINICAL DATA:  Acute respiratory failure. Endotracheally intubated. EXAM: PORTABLE CHEST 1 VIEW COMPARISON:  03/18/2021 FINDINGS: Endotracheal tube and nasogastric tube remain in place. Heart size is normal. Improved  aeration and decreased airspace opacity is seen in the left lower lung since prior study. No new or worsening areas of pulmonary infiltrate are seen. No evidence of pleural effusion. IMPRESSION: Improved aeration and decreased airspace disease in left lower lobe. Electronically Signed   By: Marlaine Hind M.D.   On: 03/19/2021 08:25   DG Chest Portable 1 View  Result Date: 03/18/2021 CLINICAL DATA:  Intubation.  Unresponsive. EXAM: PORTABLE CHEST 1 VIEW COMPARISON:  Radiograph 02/03/2021 FINDINGS: Endotracheal tube tip approximately 2.2 cm from the carina. Enteric tube tip and side-port below the diaphragm in the stomach. Lung volumes are low. Patchy retrocardiac and left lung base opacity, possible associated pleural effusion. No pulmonary edema. No pneumothorax. No acute osseous abnormalities are seen. IMPRESSION: 1. Endotracheal tube tip approximately 2.2 cm from the carina. 2. Enteric tube tip and side-port below the diaphragm in the stomach. 3. Patchy retrocardiac and left lung base opacity may represent pneumonia, atelectasis, or aspiration in the appropriate clinical setting. Possible left pleural effusion. Electronically Signed   By: Keith Rake M.D.   On: 03/18/2021 18:03     Subjective: Without complaints  Discharge Exam: Vitals:   03/30/21 0824 03/30/21 1101  BP: 106/89 103/85  Pulse: 87 84  Resp: 20 18  Temp: 98.5 F (36.9 C) 98.3 F (36.8 C)  SpO2: 98% 97%   Vitals:   03/29/21 2011 03/29/21 2258 03/30/21 0824 03/30/21 1101  BP: (!) 127/95 134/79 106/89 103/85  Pulse:   87 84  Resp: (!) 21 (!) 23 20 18   Temp: 97.7 F (36.5 C) 98.2 F (36.8 C) 98.5 F (36.9 C) 98.3 F (36.8 C)  TempSrc: Oral Oral Oral Oral  SpO2:   98% 97%  Weight:      Height:        General: Pt is alert, awake, not in acute distress Cardiovascular: RRR, S1/S2 +, no rubs, no gallops Respiratory: CTA bilaterally, no wheezing, no rhonchi Abdominal: Soft, NT, ND, bowel sounds + Extremities: no  edema, no cyanosis   The results of significant diagnostics from this hospitalization (including imaging, microbiology, ancillary and laboratory) are listed below for reference.     Microbiology: No results found for this or any previous visit (from the past 240 hour(s)).   Labs: BNP (last 3 results) No results for input(s): BNP in the last 8760 hours. Basic Metabolic Panel: Recent Labs  Lab 03/24/21 0305 03/25/21 1418 03/26/21 0557 03/26/21 1653  NA 149*  --   --   --   K 4.1  --   --   --   CL 114*  --   --   --   CO2 28  --   --   --   GLUCOSE 114*  --   --   --   BUN 28*  --   --   --   CREATININE 0.86  --   --   --   CALCIUM 9.9  --   --   --   MG 2.2 2.0 1.9 1.8  PHOS 2.9 2.4* 2.3* 3.6   Liver Function Tests: No results for input(s): AST, ALT, ALKPHOS, BILITOT, PROT, ALBUMIN in the  last 168 hours. No results for input(s): LIPASE, AMYLASE in the last 168 hours. No results for input(s): AMMONIA in the last 168 hours. CBC: Recent Labs  Lab 03/24/21 0305  WBC 9.8  HGB 10.3*  HCT 32.5*  MCV 88.3  PLT 283   Cardiac Enzymes: No results for input(s): CKTOTAL, CKMB, CKMBINDEX, TROPONINI in the last 168 hours. BNP: Invalid input(s): POCBNP CBG: Recent Labs  Lab 03/28/21 1949 03/29/21 0755 03/29/21 2033 03/29/21 2258 03/30/21 1126  GLUCAP 101* 167* 120* 111* 127*   D-Dimer No results for input(s): DDIMER in the last 72 hours. Hgb A1c Recent Labs    03/28/21 0819  HGBA1C 5.8*   Lipid Profile No results for input(s): CHOL, HDL, LDLCALC, TRIG, CHOLHDL, LDLDIRECT in the last 72 hours. Thyroid function studies No results for input(s): TSH, T4TOTAL, T3FREE, THYROIDAB in the last 72 hours.  Invalid input(s): FREET3 Anemia work up No results for input(s): VITAMINB12, FOLATE, FERRITIN, TIBC, IRON, RETICCTPCT in the last 72 hours. Urinalysis    Component Value Date/Time   COLORURINE STRAW (A) 03/18/2021 2026   APPEARANCEUR CLEAR 03/18/2021 2026    LABSPEC 1.003 (L) 03/18/2021 2026   PHURINE 7.0 03/18/2021 2026   GLUCOSEU NEGATIVE 03/18/2021 2026   HGBUR NEGATIVE 03/18/2021 2026   BILIRUBINUR NEGATIVE 03/18/2021 2026   KETONESUR 5 (A) 03/18/2021 2026   PROTEINUR NEGATIVE 03/18/2021 2026   NITRITE NEGATIVE 03/18/2021 2026   LEUKOCYTESUR NEGATIVE 03/18/2021 2026   Sepsis Labs Invalid input(s): PROCALCITONIN,  WBC,  LACTICIDVEN Microbiology No results found for this or any previous visit (from the past 240 hour(s)).  Time spent: 9min  SIGNED:   Marylu Lund, MD  [facility] 03/30/2021, 3:21 PM  If 7PM-7AM, please contact night-coverage

## 2021-03-30 NOTE — TOC Transition Note (Signed)
Transition of Care [facility]) - CM/SW Discharge Note   Patient Details  Name: Paula Dickson MRN: [id number] Date of Birth: Apr 15, 1966  [facility] CM/SW Contact:  Joanne Chars, LCSW Phone Number: 03/30/2021, 4:28 PM   Clinical Narrative:   Pt was accepted to [facility].  CSW spoke with pt about this plan and pt again asked where [facility] was.  When informed that it was in [place], pt again said no thanks and that she declines to be transferred to inpatient psych facility.  CSW briefly discussed with pt but her response did not change. CSW spoke with MD regarding need for IVC and MD is in agreement. IVC paperwork completed, sent to magistrate.  Pt served by [employer].  CSW spoke with Terrill Mohr at [facility] and transportation to [facility] was arranged.    Final next level of care: Psychiatric Hospital Barriers to Discharge: Barriers Resolved   Patient Goals and CMS Choice        Discharge Placement              Patient chooses bed at:  [facility]) Patient to be transferred to facility by: [facility]                                     Social Determinants of Health (SDOH) Interventions     Readmission Risk Interventions No flowsheet data found.

## 2021-03-30 NOTE — Progress Notes (Signed)
Patient was accepted to [facility].    Meets inpatient criteria per Sheran Fava, NP  Attending physician is Dr. Jonelle Sports.    Notified Dirk Dress, RN and Tish Frederickson, RN of acceptance.  Nurses call report to [phone].    Patient can arrive at 2:00PM on 03/30/2021.    Assunta Curtis, MSW, LCSW 03/30/2021 1:42 PM

## 2021-03-31 ENCOUNTER — Encounter (HOSPITAL_COMMUNITY): Payer: Self-pay

## 2021-11-22 ENCOUNTER — Other Ambulatory Visit: Payer: Self-pay

## 2021-11-22 ENCOUNTER — Encounter (HOSPITAL_COMMUNITY): Payer: Self-pay

## 2021-11-22 ENCOUNTER — Emergency Department (HOSPITAL_COMMUNITY)
Admission: EM | Admit: 2021-11-22 | Discharge: 2021-11-23 | Disposition: A | Discharge status: Other Acute Inpatient Hospital | Payer: 59 | Source: Home / Self Care | Attending: Emergency Medicine | Admitting: Emergency Medicine

## 2021-11-22 DIAGNOSIS — F250 Schizoaffective disorder, bipolar type: Secondary | ICD-10-CM | POA: Insufficient documentation

## 2021-11-22 DIAGNOSIS — I10 Essential (primary) hypertension: Secondary | ICD-10-CM | POA: Insufficient documentation

## 2021-11-22 DIAGNOSIS — Z79899 Other long term (current) drug therapy: Secondary | ICD-10-CM | POA: Insufficient documentation

## 2021-11-22 DIAGNOSIS — F23 Brief psychotic disorder: Secondary | ICD-10-CM

## 2021-11-22 DIAGNOSIS — Z20822 Contact with and (suspected) exposure to covid-19: Secondary | ICD-10-CM | POA: Insufficient documentation

## 2021-11-22 DIAGNOSIS — Y900 Blood alcohol level of less than 20 mg/100 ml: Secondary | ICD-10-CM | POA: Insufficient documentation

## 2021-11-22 DIAGNOSIS — F312 Bipolar disorder, current episode manic severe with psychotic features: Secondary | ICD-10-CM | POA: Diagnosis present

## 2021-11-22 LAB — CBC WITH DIFFERENTIAL/PLATELET
Abs Immature Granulocytes: 0.01 10*3/uL (ref 0.00–0.07)
Basophils Absolute: 0 10*3/uL (ref 0.0–0.1)
Basophils Relative: 1 %
Eosinophils Absolute: 0 10*3/uL (ref 0.0–0.5)
Eosinophils Relative: 1 %
HCT: 37.9 % (ref 36.0–46.0)
Hemoglobin: 12.3 g/dL (ref 12.0–15.0)
Immature Granulocytes: 0 %
Lymphocytes Relative: 25 %
Lymphs Abs: 1.5 10*3/uL (ref 0.7–4.0)
MCH: 29.5 pg (ref 26.0–34.0)
MCHC: 32.5 g/dL (ref 30.0–36.0)
MCV: 90.9 fL (ref 80.0–100.0)
Monocytes Absolute: 0.6 10*3/uL (ref 0.1–1.0)
Monocytes Relative: 10 %
Neutro Abs: 3.8 10*3/uL (ref 1.7–7.7)
Neutrophils Relative %: 63 %
Platelets: 227 10*3/uL (ref 150–400)
RBC: 4.17 MIL/uL (ref 3.87–5.11)
RDW: 13.3 % (ref 11.5–15.5)
WBC: 6 10*3/uL (ref 4.0–10.5)
nRBC: 0 % (ref 0.0–0.2)

## 2021-11-22 LAB — COMPREHENSIVE METABOLIC PANEL
ALT: 19 U/L (ref 0–44)
AST: 19 U/L (ref 15–41)
Albumin: 4.7 g/dL (ref 3.5–5.0)
Alkaline Phosphatase: 64 U/L (ref 38–126)
Anion gap: 10 (ref 5–15)
BUN: 19 mg/dL (ref 6–20)
CO2: 22 mmol/L (ref 22–32)
Calcium: 10.1 mg/dL (ref 8.9–10.3)
Chloride: 113 mmol/L — ABNORMAL HIGH (ref 98–111)
Creatinine, Ser: 1.26 mg/dL — ABNORMAL HIGH (ref 0.44–1.00)
GFR, Estimated: 50 mL/min — ABNORMAL LOW (ref >60)
Glucose, Bld: 113 mg/dL — ABNORMAL HIGH (ref 70–99)
Potassium: 3.6 mmol/L (ref 3.5–5.1)
Sodium: 145 mmol/L (ref 135–145)
Total Bilirubin: 1 mg/dL (ref 0.3–1.2)
Total Protein: 7.9 g/dL (ref 6.5–8.1)

## 2021-11-22 LAB — ETHANOL: Alcohol, Ethyl (B): <10 mg/dL (ref <10)

## 2021-11-22 LAB — RESP PANEL BY RT-PCR (FLU A&B, COVID) ARPGX2
Influenza A by PCR: NEGATIVE
Influenza B by PCR: NEGATIVE
SARS Coronavirus 2 by RT PCR: NEGATIVE

## 2021-11-22 MED ORDER — PROPRANOLOL HCL 20 MG PO TABS
20.0000 mg | ORAL_TABLET | Freq: Two times a day (BID) | ORAL | Status: DC
  Administered 2021-11-22: 23:00:00 20 mg via ORAL
  Filled 2021-11-22 (×2): qty 1

## 2021-11-22 MED ORDER — HYDROCHLOROTHIAZIDE 12.5 MG PO TABS
25.0000 mg | ORAL_TABLET | Freq: Every day | ORAL | Status: DC
  Administered 2021-11-22: 23:00:00 25 mg via ORAL
  Filled 2021-11-22 (×2): qty 2

## 2021-11-22 MED ORDER — AMLODIPINE BESYLATE 5 MG PO TABS
5.0000 mg | ORAL_TABLET | Freq: Every day | ORAL | Status: DC
  Administered 2021-11-22: 23:00:00 5 mg via ORAL
  Filled 2021-11-22 (×2): qty 1

## 2021-11-22 MED ORDER — METHIMAZOLE 10 MG PO TABS
10.0000 mg | ORAL_TABLET | Freq: Two times a day (BID) | ORAL | Status: DC
  Administered 2021-11-22: 23:00:00 10 mg via ORAL
  Filled 2021-11-22 (×4): qty 1

## 2021-11-22 MED ORDER — LOSARTAN POTASSIUM 25 MG PO TABS
25.0000 mg | ORAL_TABLET | Freq: Every day | ORAL | Status: DC
  Administered 2021-11-22: 23:00:00 25 mg via ORAL
  Filled 2021-11-22: qty 1

## 2021-11-22 MED ORDER — PANTOPRAZOLE SODIUM 40 MG PO TBEC
40.0000 mg | DELAYED_RELEASE_TABLET | Freq: Every day | ORAL | Status: DC
  Administered 2021-11-22: 23:00:00 40 mg via ORAL
  Filled 2021-11-22 (×2): qty 1

## 2021-11-22 MED ORDER — ZIPRASIDONE MESYLATE 20 MG IM SOLR
10.0000 mg | Freq: Once | INTRAMUSCULAR | Status: AC
  Administered 2021-11-22: 10 mg via INTRAMUSCULAR
  Filled 2021-11-22: qty 20

## 2021-11-22 MED ORDER — AMANTADINE HCL 100 MG PO CAPS
100.0000 mg | ORAL_CAPSULE | Freq: Two times a day (BID) | ORAL | Status: DC
Start: 2021-11-22 — End: 2021-11-23
  Administered 2021-11-22: 23:00:00 100 mg via ORAL
  Filled 2021-11-22 (×2): qty 1

## 2021-11-22 MED ORDER — TRAZODONE HCL 100 MG PO TABS
100.0000 mg | ORAL_TABLET | Freq: Every day | ORAL | Status: DC
  Administered 2021-11-22: 23:00:00 100 mg via ORAL
  Filled 2021-11-22: qty 1

## 2021-11-22 MED ORDER — BENZTROPINE MESYLATE 1 MG PO TABS
1.0000 mg | ORAL_TABLET | Freq: Every day | ORAL | Status: DC
  Administered 2021-11-22: 23:00:00 1 mg via ORAL
  Filled 2021-11-22: qty 1

## 2021-11-22 MED ORDER — HALOPERIDOL 1 MG PO TABS
2.0000 mg | ORAL_TABLET | Freq: Two times a day (BID) | ORAL | Status: DC
  Administered 2021-11-22: 23:00:00 2 mg via ORAL
  Filled 2021-11-22 (×3): qty 2

## 2021-11-22 NOTE — ED Notes (Signed)
Pt changed into burgundy scrubs. Pt has 1 personal belongings bag including: sweatpants, tank-top, shirt, coat, and socks. Bag placed in cabinet: 16-22. Security wanded pt.

## 2021-11-22 NOTE — ED Notes (Signed)
Been changed and wand by security.

## 2021-11-22 NOTE — ED Provider Notes (Signed)
[facility] DEPT Provider Note   CSN: [id number] Arrival date & time: 11/22/21  1811     History Chief Complaint  Patient presents with   Psychiatric Evaluation    Paula Dickson is a 55 y.o. female.  Patient with hx schizophrenia and bipolar disorder, presents via EMS from home - family notes patient off meds for long while, and 'acting crazy'. Pt very poor historian - indicates from [place], then [place], then says has been swimming a lot, mentions unnamed others out to harm her - pt moves from one unrelated thought to next, and often does not respond to questions asked - level 5 caveat. No report of fever. No report of trauma/injury.   The history is provided by the patient, medical records and the EMS personnel. The history is limited by the condition of the patient.      Past Medical History:  Diagnosis Date   Bipolar affective disorder ([place])    Bipolar disorder ([place])    History of arthritis    History of chicken pox    History of depression    History of genital warts    history of heart murmur    History of high blood pressure    History of thyroid disease    History of UTI    Hypertension    Low TSH level 07/13/2017   Schizophrenia ([place])     Patient Active Problem List   Diagnosis Date Noted   Malnutrition of moderate degree 03/26/2021   Encounter for intubation    Encephalopathy acute    Respiratory failure ([place])    Schizophrenia ([place])    Overdose 03/18/2021   Paroxysmal atrial fibrillation (H[facility])    Hypernatremia 02/03/2021   AKI (acute kidney injury) ([place]) 02/03/2021   Schizoaffective disorder, bipolar type ([place]) 79/12/4095   Acute metabolic encephalopathy 35/32/9924   AMS (altered mental status) 11/17/2019   Lithium toxicity 11/17/2019   Hyperthyroidism 10/14/2019   Medication side effect, initial encounter    Schizoaffective disorder ([place]) 03/13/2019   Severe mixed bipolar 1 disorder without psychosis ([place])  03/13/2019   Bipolar affective disorder, current episode mixed ([place]) 11/01/2018   Toxic encephalopathy 07/14/2017   Suicide attempt ([place]) 07/13/2017   Low TSH level 07/13/2017   Overdose of psychotropic 07/12/2017   Valproic acid toxicity 07/12/2017   Hyperammonemia ([place]) 07/12/2017   Schizophrenia ([place]) 07/12/2017   Bipolar disorder, most recent episode depressed ([place]) 07/12/2017   Benign essential HTN 02/21/2017   Hyperlipidemia, mixed 02/21/2017   Insomnia 12/27/2015   Abnormal urinalysis 12/27/2015   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     Family History  Problem Relation Age of Onset   Arthritis Father    Hyperlipidemia Father    High blood pressure Father    Diabetes Sister    Diabetes Mother    Diabetes Brother    Mental illness Brother    Alcohol abuse Paternal Uncle    Alcohol abuse Paternal Grandfather    Breast cancer Maternal Aunt    Breast cancer Paternal Aunt    High blood pressure Sister    Mental illness Other  runs in family    Social History   Tobacco Use   Smoking status: Never   Smokeless tobacco: Never  Vaping Use   Vaping Use: Never used  Substance Use Topics   Alcohol use: Not Currently   Drug use: Not Currently    Home Medications Prior to Admission medications   Medication Sig Start Date End Date Taking? Authorizing Provider  amantadine (SYMMETREL) 100 MG capsule Take 1 capsule (100 mg total) by mouth 2 (two) times daily. 02/20/21 05/21/21  Terrilee Croak, MD  amantadine (SYMMETREL) 100 MG capsule Take 100 mg by mouth 2 (two) times daily. 02/20/21   [provider]  amLODipine (NORVASC) 10 MG tablet Take 1 tablet by mouth daily.  11/09/20   [provider]  benztropine (COGENTIN) 1 MG tablet Take 1 mg by mouth at bedtime. 12/15/20   [provider]  haloperidol (HALDOL) 2 MG tablet Take 1 tablet (2 mg total) by mouth 2 (two) times daily. 02/20/21 05/21/21  Terrilee Croak, MD  haloperidol (HALDOL) 2 MG tablet Take 1 tablet (2 mg total) by mouth daily. 03/30/21 04/29/21  Donne Hazel, MD  hydrochlorothiazide (HYDRODIURIL) 25 MG tablet Take 1 tablet (25 mg total) by mouth daily. 03/30/21 04/29/21  Donne Hazel, MD  losartan (COZAAR) 25 MG tablet Take 1 tablet (25 mg total) by mouth daily. 03/30/21 04/29/21  Donne Hazel, MD  methimazole (TAPAZOLE) 10 MG tablet Take 1 tablet (10 mg total) by mouth 2 (two) times daily. 02/20/21 05/21/21  Terrilee Croak, MD  methimazole (TAPAZOLE) 10 MG tablet Take 10 mg by mouth 2 (two) times daily. 02/20/21   [provider]  pantoprazole (PROTONIX) 40 MG tablet Take 1 tablet (40 mg total) by mouth daily. 03/30/21 04/29/21  Donne Hazel, MD  propranolol (INDERAL) 20 MG tablet Take 1 tablet (20 mg total) by mouth 2 (two) times daily. 03/30/21 04/29/21  Donne Hazel, MD  propranolol (INDERAL) 40 MG tablet Take 1 tablet (40 mg total) by mouth 2 (two) times daily. 02/20/21 05/21/21  Terrilee Croak, MD  traZODone (DESYREL) 100 MG tablet Take 1 tablet (100 mg total) by mouth at bedtime. 03/30/21 04/29/21  Donne Hazel, MD    Allergies    Patient has no known allergies.  Review of Systems   Review of Systems  Unable to perform ROS: Psychiatric disorder  Pt not compliant w ros - level 5 caveat   Physical Exam Updated Vital Signs BP (!) 143/82 (BP Location: Right Arm)    Pulse 94    Temp 98.9 F (37.2 C) (Oral)    Resp 18    Ht 1.6 m (5\' 3" )    Wt 61.2 kg    SpO2 96%    BMI 23.91 kg/m   Physical Exam Vitals and nursing note reviewed.  Constitutional:      Appearance: Normal appearance. She is well-developed.  HENT:     Head: Atraumatic.     Nose: Nose normal.      Mouth/Throat:     Mouth: Mucous membranes are moist.  Eyes:     General: No scleral icterus.    Conjunctiva/sclera: Conjunctivae normal.     Pupils: Pupils are equal, round, and reactive to light.  Neck:     Vascular: No carotid bruit.     Trachea: No tracheal deviation.     Comments: No stiffness or rigidity Cardiovascular:     Rate and Rhythm: Normal rate and regular rhythm.  Pulses: Normal pulses.     Heart sounds: Normal heart sounds. No murmur heard.   No friction rub. No gallop.  Pulmonary:     Effort: Pulmonary effort is normal. No respiratory distress.     Breath sounds: Normal breath sounds.  Abdominal:     General: Bowel sounds are normal. There is no distension.     Palpations: Abdomen is soft.     Tenderness: There is no abdominal tenderness.  Genitourinary:    Comments: No cva tenderness.  Musculoskeletal:        General: No swelling or tenderness.     Cervical back: Normal range of motion and neck supple. No rigidity. No muscular tenderness.  Skin:    General: Skin is warm and dry.     Findings: No rash.  Neurological:     Mental Status: She is alert.     Comments: Alert, speech normal, no gross dysarthria or aphasia noted. Motor/sens grossly intact bil. Steady gait.   Psychiatric:     Comments: Unusual affect. Pt rapidly moves from one unrelated thought to next. Appears to be responding to internal stimuli, delusional thoughts.     ED Results / Procedures / Treatments   Labs (all labs ordered are listed, but only abnormal results are displayed) Results for orders placed or performed during the hospital encounter of 11/22/21  Comprehensive metabolic panel  Result Value Ref Range   Sodium 145 135 - 145 mmol/L   Potassium 3.6 3.5 - 5.1 mmol/L   Chloride 113 (H) 98 - 111 mmol/L   CO2 22 22 - 32 mmol/L   Glucose, Bld 113 (H) 70 - 99 mg/dL   BUN 19 6 - 20 mg/dL   Creatinine, Ser 1.26 (H) 0.44 - 1.00 mg/dL   Calcium 10.1 8.9 - 10.3 mg/dL   Total Protein  7.9 6.5 - 8.1 g/dL   Albumin 4.7 3.5 - 5.0 g/dL   AST 19 15 - 41 U/L   ALT 19 0 - 44 U/L   Alkaline Phosphatase 64 38 - 126 U/L   Total Bilirubin 1.0 0.3 - 1.2 mg/dL   GFR, Estimated 50 (L) >60 mL/min   Anion gap 10 5 - 15  Ethanol  Result Value Ref Range   Alcohol, Ethyl (B) <10 <10 mg/dL  CBC with Diff  Result Value Ref Range   WBC 6.0 4.0 - 10.5 K/uL   RBC 4.17 3.87 - 5.11 MIL/uL   Hemoglobin 12.3 12.0 - 15.0 g/dL   HCT 37.9 36.0 - 46.0 %   MCV 90.9 80.0 - 100.0 fL   MCH 29.5 26.0 - 34.0 pg   MCHC 32.5 30.0 - 36.0 g/dL   RDW 13.3 11.5 - 15.5 %   Platelets 227 150 - 400 K/uL   nRBC 0.0 0.0 - 0.2 %   Neutrophils Relative % 63 %   Neutro Abs 3.8 1.7 - 7.7 K/uL   Lymphocytes Relative 25 %   Lymphs Abs 1.5 0.7 - 4.0 K/uL   Monocytes Relative 10 %   Monocytes Absolute 0.6 0.1 - 1.0 K/uL   Eosinophils Relative 1 %   Eosinophils Absolute 0.0 0.0 - 0.5 K/uL   Basophils Relative 1 %   Basophils Absolute 0.0 0.0 - 0.1 K/uL   Immature Granulocytes 0 %   Abs Immature Granulocytes 0.01 0.00 - 0.07 K/uL      EKG None  Radiology No results found.  Procedures Procedures   Medications Ordered in ED Medications  ziprasidone (GEODON) injection  10 mg (has no administration in time range)    ED Course  I have reviewed the triage vital signs and the nursing notes.  Pertinent labs & imaging results that were available during my care of the patient were reviewed by me and considered in my medical decision making (see chart for details).    MDM Rules/Calculators/A&P                         Labs sent.   Reviewed nursing notes and prior charts for additional history.   [facility] team consulted.   Patient given geodon for symptom improvement.   Labs reviewed/interpreted by me - wbc and hgb normal. K normal.   The patient has been placed in psychiatric observation due to the need to provide a safe environment for the patient while obtaining psychiatric consultation and  evaluation, as well as ongoing medical and medication management to treat the patient's condition.  The patient has not been placed under full IVC at this time.  Will plan to re-start home meds when med rec done.   BH eval pending - disposition per [facility] team.       Final Clinical Impression(s) / ED Diagnoses Final diagnoses:  None    Rx / DC Orders ED Discharge Orders     None        Lajean Saver, MD 11/22/21 [phone]

## 2021-11-22 NOTE — ED Triage Notes (Signed)
PT BIB [facility] from home. Pt family reports that she has been off of her psych meds for an unknown amount of time and has been "acting crazy". Pt h/x schizophrenia.

## 2021-11-23 ENCOUNTER — Other Ambulatory Visit: Payer: Self-pay

## 2021-11-23 ENCOUNTER — Encounter (HOSPITAL_COMMUNITY): Payer: Self-pay | Admitting: Psychiatry

## 2021-11-23 ENCOUNTER — Inpatient Hospital Stay (HOSPITAL_COMMUNITY)
Admission: AD | Admit: 2021-11-23 | Discharge: 2021-12-23 | DRG: 885 | Disposition: A | Discharge status: Home / Self Care | Payer: 59 | Source: Intra-hospital | Attending: Psychiatry | Admitting: Psychiatry

## 2021-11-23 DIAGNOSIS — Z818 Family history of other mental and behavioral disorders: Secondary | ICD-10-CM | POA: Diagnosis not present

## 2021-11-23 DIAGNOSIS — R2689 Other abnormalities of gait and mobility: Secondary | ICD-10-CM | POA: Diagnosis present

## 2021-11-23 DIAGNOSIS — Z87440 Personal history of urinary (tract) infections: Secondary | ICD-10-CM

## 2021-11-23 DIAGNOSIS — E0590 Thyrotoxicosis, unspecified without thyrotoxic crisis or storm: Secondary | ICD-10-CM | POA: Diagnosis present

## 2021-11-23 DIAGNOSIS — N179 Acute kidney failure, unspecified: Secondary | ICD-10-CM | POA: Diagnosis not present

## 2021-11-23 DIAGNOSIS — Z9114 Patient's other noncompliance with medication regimen: Secondary | ICD-10-CM | POA: Diagnosis not present

## 2021-11-23 DIAGNOSIS — F419 Anxiety disorder, unspecified: Secondary | ICD-10-CM | POA: Diagnosis present

## 2021-11-23 DIAGNOSIS — E669 Obesity, unspecified: Secondary | ICD-10-CM | POA: Diagnosis present

## 2021-11-23 DIAGNOSIS — Z20822 Contact with and (suspected) exposure to covid-19: Secondary | ICD-10-CM | POA: Diagnosis present

## 2021-11-23 DIAGNOSIS — K5900 Constipation, unspecified: Secondary | ICD-10-CM | POA: Diagnosis not present

## 2021-11-23 DIAGNOSIS — I10 Essential (primary) hypertension: Secondary | ICD-10-CM | POA: Diagnosis present

## 2021-11-23 DIAGNOSIS — F209 Schizophrenia, unspecified: Secondary | ICD-10-CM | POA: Diagnosis present

## 2021-11-23 DIAGNOSIS — R462 Strange and inexplicable behavior: Secondary | ICD-10-CM | POA: Diagnosis not present

## 2021-11-23 DIAGNOSIS — Z9151 Personal history of suicidal behavior: Secondary | ICD-10-CM

## 2021-11-23 DIAGNOSIS — F068 Other specified mental disorders due to known physiological condition: Secondary | ICD-10-CM | POA: Diagnosis present

## 2021-11-23 DIAGNOSIS — K219 Gastro-esophageal reflux disease without esophagitis: Secondary | ICD-10-CM | POA: Diagnosis present

## 2021-11-23 DIAGNOSIS — G9349 Other encephalopathy: Secondary | ICD-10-CM | POA: Diagnosis present

## 2021-11-23 DIAGNOSIS — Z79899 Other long term (current) drug therapy: Secondary | ICD-10-CM | POA: Diagnosis not present

## 2021-11-23 DIAGNOSIS — F250 Schizoaffective disorder, bipolar type: Principal | ICD-10-CM | POA: Diagnosis present

## 2021-11-23 DIAGNOSIS — Z6833 Body mass index (BMI) 33.0-33.9, adult: Secondary | ICD-10-CM

## 2021-11-23 MED ORDER — HYDROXYZINE HCL 25 MG PO TABS
25.0000 mg | ORAL_TABLET | Freq: Three times a day (TID) | ORAL | Status: DC | PRN
  Administered 2021-11-23 – 2021-11-26 (×6): 25 mg via ORAL
  Filled 2021-11-23 (×6): qty 1

## 2021-11-23 MED ORDER — LORAZEPAM 2 MG/ML IJ SOLN
2.0000 mg | Freq: Once | INTRAMUSCULAR | Status: AC
  Administered 2021-11-23: 02:00:00 2 mg via INTRAMUSCULAR
  Filled 2021-11-23: qty 1

## 2021-11-23 MED ORDER — ACETAMINOPHEN 325 MG PO TABS
650.0000 mg | ORAL_TABLET | Freq: Four times a day (QID) | ORAL | Status: DC | PRN
  Administered 2021-11-27: 650 mg via ORAL
  Filled 2021-11-23: qty 2

## 2021-11-23 MED ORDER — HALOPERIDOL LACTATE 5 MG/ML IJ SOLN: 5.0000 mg | Freq: Once | INTRAMUSCULAR | Status: DC

## 2021-11-23 MED ORDER — ALUM & MAG HYDROXIDE-SIMETH 200-200-20 MG/5ML PO SUSP: 30.0000 mL | ORAL | Status: DC | PRN

## 2021-11-23 MED ORDER — LORAZEPAM 2 MG/ML IJ SOLN
2.0000 mg | Freq: Once | INTRAMUSCULAR | Status: AC
  Administered 2021-11-23: 12:00:00 2 mg via INTRAMUSCULAR
  Filled 2021-11-23: qty 1

## 2021-11-23 MED ORDER — TRAZODONE HCL 50 MG PO TABS
50.0000 mg | ORAL_TABLET | Freq: Every evening | ORAL | Status: DC | PRN
  Administered 2021-11-23 – 2021-11-26 (×4): 50 mg via ORAL
  Filled 2021-11-23 (×4): qty 1

## 2021-11-23 MED ORDER — LORAZEPAM 1 MG PO TABS
1.0000 mg | ORAL_TABLET | Freq: Once | ORAL | Status: DC
  Filled 2021-11-23: qty 1

## 2021-11-23 MED ORDER — MAGNESIUM HYDROXIDE 400 MG/5ML PO SUSP: 30.0000 mL | Freq: Every day | ORAL | Status: DC | PRN

## 2021-11-23 NOTE — Progress Notes (Signed)
TOC CSW spoke with Transportation and arrange safe transport ride for pt to [facility]. No ETA was given.   Arlie Solomons.Gorden Stthomas, MSW, [place]   Transitions of Care Clinical Social Worker I Direct Dial: [phone]   Fax: [phone] [email or web address]

## 2021-11-23 NOTE — ED Notes (Signed)
Report given to Grayland Ormond, RN.

## 2021-11-23 NOTE — ED Notes (Signed)
IM Ativan given at this time as ordered by EDP.

## 2021-11-23 NOTE — BH Assessment (Signed)
Comprehensive Clinical Assessment (CCA) Note  11/23/2021 Paula Dickson [id number]   Disposition:  Lindon Romp, NP recommended Inpatient. AC reviewing bed status, if no available beds disposition SW to find placement in the morning.   The patient demonstrates the following risk factors for suicide: Chronic risk factors for suicide include: psychiatric disorder of schizophrenia . Acute risk factors for suicide include: N/A. Protective factors for this patient include:  unable to asses, pt is psychotic . Considering these factors, the overall suicide risk at this point appears to be uknown, pt was unable to be assessed. Patient is not appropriate for outpatient follow up.   [place] ED from 11/22/2021 in [place] DEPT ED to Hosp-Admission (Discharged) from 03/18/2021 in [place] ED to Hosp-Admission (Discharged) from 02/03/2021 in [place]  C-SSRS RISK CATEGORY No Risk No Risk No Risk        Chief Complaint:  Chief Complaint  Patient presents with   Psychiatric Evaluation   Visit Diagnosis: Schizophrenia Unspecified   Pt is a 55 year old female reporting via [facility] due family notes patient has been off her medications and "acting crazy".  Pt was unable to be assessed due to currently psychotic, information obtained via ED notes.  Pt was labile during TTS consult and spoke in word salad. Pt was delusional (religious) and reports " I was dreaming I was in 7th heaven." And saying bible versus. Pt appeared to be responding to internal stimuli and was unable to be redirected at times. When patient is asked questions, she would refuse to answer. Dispositioned with Lindon Romp, NP who recommended Inpatient. AC reviewing bed status, if no available beds disposition SW to find placement in the morning.    CCA Screening, Triage and Referral (STR)  Patient Reported Information How did you  hear about [place]? Family/Friend  What Is the Reason for Your Visit/Call Today? Pt reports to the ED via [facility] due to family reports patient has been off her medicatios for awhile and is "acting crazy".  How Long Has This Been Causing You Problems? -- (unable to assess due to patient is psychotic)  What Do You Feel Would Help You the Most Today? -- (unable to assess due to patient is psychotic)   Have You Recently Had Any Thoughts About Hurting Yourself? -- (unable to assess due to patient is psychotic)  Are You Planning to [place] At This time? -- (unable to assess due to patient is psychotic)   Have you Recently Had Thoughts About [place]? -- (unable to assess due to patient is psychotic)  Are You Planning to Harm Someone at This Time? -- (unable to assess due to patient is psychotic)  Explanation: No data recorded  Have You Used Any Alcohol or Drugs in the Past 24 Hours? -- (unable to assess due to patient is psychotic)  How Long Ago Did You Use Drugs or Alcohol? No data recorded What Did You Use and How Much? No data recorded  Do You Currently Have a Therapist/Psychiatrist? -- (unable to assess due to patient is psychotic)  Name of Therapist/Psychiatrist: No data recorded  Have You Been Recently Discharged From Any Office Practice or Programs? -- (unable to assess due to patient is psychotic)  Explanation of Discharge From Practice/Program: No data recorded    CCA Screening Triage Referral Assessment Type of Contact: Tele-Assessment  Telemedicine Service Delivery:   Is this Initial or  Reassessment? Initial Assessment  Date Telepsych consult ordered in [facility]:  11/22/21  Time Telepsych consult ordered in [facility]:  1905  Location of Assessment: [facility]  Provider Location: [facility] Assessment Services   Collateral Involvement: none   Does Patient Have a [place]? No data recorded Name and Contact of Legal Guardian: No data  recorded If Minor and Not Living with Parent(s), Who has Custody? n/a  Is CPS involved or ever been involved? -- (unable to assess due to patient is psychotic)  Is APS involved or ever been involved? -- (unable to assess due to patient is psychotic)   Patient Determined To Be At Risk for Harm To Self or Others Based on Review of Patient Reported Information or Presenting Complaint? -- (unable to assess due to patient is psychotic)  Method: No data recorded Availability of Means: No data recorded Intent: No data recorded Notification Required: No data recorded Additional Information for Danger to Others Potential: No data recorded Additional Comments for Danger to Others Potential: No data recorded Are There Guns or Other Weapons in Your Home? No data recorded Types of Guns/Weapons: No data recorded Are These Weapons Safely Secured?                            No data recorded Who Could Verify You Are Able To Have These Secured: No data recorded Do You Have any Outstanding Charges, Pending Court Dates, Parole/Probation? No data recorded Contacted To Inform of Risk of Harm To Self or Others: -- (unable to assess due to patient is psychotic)    Does Patient Present under Involuntary Commitment? No  IVC Papers Initial File Date: No data recorded  [place] of Residence: [place]   Patient Currently Receiving the Following Services: -- (unable to assess due to patient is psychotic)   Determination of Need: Emergent (2 hours)   Options For Referral: Inpatient Hospitalization     CCA Biopsychosocial Patient Reported Schizophrenia/Schizoaffective Diagnosis in Past: Yes (per ED notes patient has a hx of schizophrenia)   Strengths: -- (unable to assess due to patient is psychotic)   Mental Health Symptoms Depression:   -- (unable to assess due to patient is psychotic)   Duration of Depressive symptoms:    Mania:   -- (unable to assess due to patient is psychotic)    Anxiety:    -- (unable to assess due to patient is psychotic)   Psychosis:   Delusions (Religuos delusions.)   Duration of Psychotic symptoms:  Duration of Psychotic Symptoms: -- (unable to assess due to patient is psychotic)   Trauma:   -- (unable to assess due to patient is psychotic)   Obsessions:   -- (unable to assess due to patient is psychotic)   Compulsions:   -- (unable to assess due to patient is psychotic)   Inattention:   -- (unable to assess due to patient is psychotic)   Hyperactivity/Impulsivity:   -- (unable to assess due to patient is psychotic)   Oppositional/Defiant Behaviors:   -- (unable to assess due to patient is psychotic)   Emotional Irregularity:   -- (unable to assess due to patient is psychotic)   Other Mood/Personality Symptoms:   Pt was speaking in word salad. Pt was labile during TTS Consult    Mental Status Exam Appearance and self-care  Stature:   Average   Weight:   Average weight   Clothing:   -- (pt was in  hospital scrubs)   Grooming:   Normal   Cosmetic use:   None   Posture/gait:   Slumped   Motor activity:   Agitated   Sensorium  Attention:   Unaware   Concentration:   Preoccupied; Scattered   Orientation:   Person   Recall/memory:   Defective in Recent   Affect and Mood  Affect:   Flat; Tearful; Labile   Mood:   Euphoric   Relating  Eye contact:   Fleeting   Facial expression:   Angry   Attitude toward examiner:   Dramatic; Suspicious   Thought and Language  Speech flow:  Flight of Ideas; Garbled (word salad)   Thought content:   Delusions   Preoccupation:   Religion   Hallucinations:   Other (Comment) (Responding to internal stimuli)   Organization:  No data recorded  [employer] of Knowledge:   Poor   Intelligence:   Average   Abstraction:   Overly abstract   Judgement:   Poor   Reality Testing:   Distorted   Insight:   Poor   Decision  Making:   -- (unable to assess due to patient is psychotic)   Social Functioning  Social Maturity:   -- (unable to assess due to patient is psychotic)   Social Judgement:   -- (unable to assess due to patient is psychotic)   Stress  Stressors:   -- (unable to assess due to patient is psychotic)   Coping Ability:   -- (unable to assess due to patient is psychotic)   Skill Deficits:   -- (unable to assess due to patient is psychotic)   Supports:   Family     Religion: Religion/Spirituality Are You A Religious Person?: No (unable to assess due to patient is psychotic) How Might This Affect Treatment?:  (unable to assess due to patient is psychotic)  Leisure/Recreation: Leisure / Recreation Do You Have Hobbies?:  (unable to assess due to patient is psychotic)  Exercise/Diet: Exercise/Diet Do You Exercise?:  (unable to assess due to patient is psychotic) Have You Gained or Lost A Significant Amount of Weight in the Past Six Months?:  (unable to assess due to patient is psychotic) Do You Follow a Special Diet?:  (unable to assess due to patient is psychotic) Do You Have Any Trouble Sleeping?:  (unable to assess due to patient is psychotic)   CCA Employment/Education Employment/Work Situation: Employment / Work Technical sales engineer:  (unable to assess due to patient is psychotic) Patient's Job has Been Impacted by Current Illness:  (unable to assess due to patient is psychotic) Has Patient ever Been in the [employer]?:  (unable to assess due to patient is psychotic)  Education: Education Is Patient Currently Attending School?:  (unable to assess due to patient is psychotic) Last Grade Completed:  (unable to assess due to patient is psychotic) Did You Attend College?:  (unable to assess due to patient is psychotic) Did You Have An Individualized Education Program (IIEP):  (unable to assess due to patient is psychotic) Did You Have Any Difficulty At School?:   (unable to assess due to patient is psychotic) Patient's Education Has Been Impacted by Current Illness:  (unable to assess due to patient is psychotic)   CCA Family/Childhood History Family and Relationship History: Family history Marital status:  (unable to assess due to patient is psychotic) Does patient have children?:  (unable to assess due to patient is psychotic)  Childhood History:  Childhood History By whom  was/is the patient raised?:  (unable to assess due to patient is psychotic) Did patient suffer any verbal/emotional/physical/sexual abuse as a child?:  (unable to assess due to patient is psychotic) Did patient suffer from severe childhood neglect?:  (unable to assess due to patient is psychotic) Has patient ever been sexually abused/assaulted/raped as an adolescent or adult?:  (unable to assess due to patient is psychotic) Was the patient ever a victim of a crime or a disaster?:  (unable to assess due to patient is psychotic) Witnessed domestic violence?:  (unable to assess due to patient is psychotic) Has patient been affected by domestic violence as an adult?:  (unable to assess due to patient is psychotic)  Child/Adolescent Assessment:     CCA Substance Use Alcohol/Drug Use: Alcohol / Drug Use Pain Medications: unable to assess due to patient is psychotic Prescriptions: unable to assess due to patient is psychotic Over the Counter: unable to assess due to patient is psychotic History of alcohol / drug use?:  (unable to assess due to patient is psychotic) Negative Consequences of Use:  (unable to assess due to patient is psychotic) Withdrawal Symptoms:  (unable to assess due to patient is psychotic)                         ASAM's:  Six Dimensions of Multidimensional Assessment  Dimension 1:  Acute Intoxication and/or Withdrawal Potential:      Dimension 2:  Biomedical Conditions and Complications:      Dimension 3:  Emotional, Behavioral, or  Cognitive Conditions and Complications:     Dimension 4:  Readiness to Change:     Dimension 5:  Relapse, Continued use, or Continued Problem Potential:     Dimension 6:  Recovery/Living Environment:     ASAM Severity Score:    ASAM Recommended Level of Treatment:     Substance use Disorder (SUD)    Recommendations for Services/Supports/Treatments:    Discharge Disposition: Discharge Disposition Medical Exam completed: Yes Disposition of Patient: Admit Mode of transportation if patient is discharged/movement?: N/A  DSM5 Diagnoses: Patient Active Problem List   Diagnosis Date Noted   Malnutrition of moderate degree 03/26/2021   Encounter for intubation    Encephalopathy acute    Respiratory failure ([place])    Schizophrenia ([place])    Overdose 03/18/2021   Paroxysmal atrial fibrillation ([place])    Hypernatremia 02/03/2021   AKI (acute kidney injury) ([place]) 02/03/2021   Schizoaffective disorder, bipolar type ([place]) 62/95/2841   Acute metabolic encephalopathy 32/44/0102   AMS (altered mental status) 11/17/2019   Lithium toxicity 11/17/2019   Hyperthyroidism 10/14/2019   Medication side effect, initial encounter    Schizoaffective disorder ([place]) 03/13/2019   Severe mixed bipolar 1 disorder without psychosis ([place]) 03/13/2019   Bipolar affective disorder, current episode mixed ([place]) 11/01/2018   Toxic encephalopathy 07/14/2017   Suicide attempt ([place]) 07/13/2017   Low TSH level 07/13/2017   Overdose of psychotropic 07/12/2017   Valproic acid toxicity 07/12/2017   Hyperammonemia ([place]) 07/12/2017   Schizophrenia ([place]) 07/12/2017   Bipolar disorder, most recent episode depressed ([place]) 07/12/2017   Benign essential HTN 02/21/2017   Hyperlipidemia, mixed 02/21/2017   Insomnia 12/27/2015   Abnormal urinalysis 12/27/2015   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 Finneas Mathe, Deon Pilling

## 2021-11-23 NOTE — Progress Notes (Signed)
Patient is 55 yrs old, involuntar.  History of schizophrenia and bipolar.  Patient has not been taking her medications.  Family said patient has been "acting crazy.  Patient continues to talk in circles.  Does not answer questions.  Smiles and laughs.  Patient was happy to see staff members.  Stated she enjoys staying at this hospital.    Lucious Groves Fowers, can be contacted, phone cell [phone]  or  home ([phone].  Patient stated she does not  drink alcohol or use any drugs.    Patient has dry skin, scar before big toe on L foot which has healed.  Denied SI and HI.  Denied A/V hallucinations.  Denied anxiety, depression and hopeless.  Patient said she has some college education.  Has not worked lately.  Has disability check.  Fall risk information given to patient.  Stated she has fallen several times recently.  High fall risk. Patient oriented to 500 hall. Food/drink given patient.

## 2021-11-23 NOTE — ED Notes (Signed)
Attempted to call report. Paula Dickson unable to take report at this time. Will re-attempt.

## 2021-11-23 NOTE — ED Notes (Addendum)
Pt will not de-escalate and refuses to take morning medications. Pt is not re-directable. EDP notified. No additional orders at this time.

## 2021-11-23 NOTE — ED Notes (Signed)
Per Jalene Mullet, pt accepted to [facility], 504-1 to Dr Caswell Corwin.

## 2021-11-23 NOTE — Progress Notes (Signed)
Pt visible on the unit much of the evening, pt disorganized like she always is, but pt does not appear to be confused like she normally is when she is admitted to the unit. Pt has been pleasant much of the evening.     11/23/21 2100  Psych Admission Type (Psych Patients Only)  Admission Status Involuntary  Psychosocial Assessment  Patient Complaints Suspiciousness  Eye Contact Brief  Facial Expression Animated  Affect Preoccupied  Speech Soft  Interaction Assertive  Motor Activity Slow  Appearance/Hygiene In scrubs  Behavior Characteristics Cooperative  Mood Preoccupied;Pleasant  Thought Process  Coherency Disorganized;Tangential;Flight of ideas  Content Preoccupation;Religiosity  Delusions None reported or observed  Perception WDL  Hallucination None reported or observed  Judgment Poor  Confusion None  Danger to Self  Current suicidal ideation? Denies  Danger to Others  Danger to Others None reported or observed

## 2021-11-23 NOTE — ED Notes (Signed)
Attempted to call [facility] x2. No answer. Unit Secretary to re-attempt at this time.

## 2021-11-23 NOTE — Progress Notes (Signed)
Pt given urine cup with instructions

## 2021-11-23 NOTE — ED Notes (Signed)
Pt resting with eyes closed. Sitter at the bedside. 1:1.

## 2021-11-23 NOTE — Progress Notes (Signed)
Adult Psychoeducational Group Note  Date:  11/23/2021 Time:  11:54 PM  Group Topic/Focus:  Wrap-Up Group:   The focus of this group is to help patients review their daily goal of treatment and discuss progress on daily workbooks.  Participation Level:  Active  Participation Quality:  Appropriate  Affect:  Anxious, Appropriate, and Excited  Cognitive:  Disorganized and Confused  Insight: Appropriate  Engagement in Group:  Engaged  Modes of Intervention:  Discussion  Additional Comments: Pt is a new admission.  Pt stated her goal for today was to focus on her treatment plan. Pt stated she accomplished her goal today. Pt stated she did not get a chance to talked with her doctor or her social worker about her care today. Pt rated her overall day an 8 out of 10. Pt stated she was able to contact her sister today which improved her overall day. Pt stated she felt better about herself tonight. Pt stated she was able to attend dinner tonight.. Pt stated she took all medications provided today. Pt stated her appetite was fair today. Pt rated her sleep last night was pretty good. Pt stated the goal tonight was to get some rest. Pt stated she had some physical pain tonight. Pt stated she had some severe pain in both her right and left knees. Pt rated the severe pain in both knees a 10 on the pain level scale. Pt nurse was updated on the situation. Pt deny visual hallucinations and auditory issues tonight. Pt denies thoughts of harming herself or others. Pt stated she would alert staff if anything changed.  [name] 11/23/2021, 11:54 PM

## 2021-11-23 NOTE — BH Assessment (Signed)
[place] Assessment Progress Note  Per Lindon Romp, NP, this pt requires psychiatric hospitalization at this time.  Danika, RN, [facility] has assigned pt to [facility] Rm 504-1 to the service of Dr Caswell Corwin.  [facility] will be ready to receive pt at 14:00.  Pt has signed Voluntary Admission and Consent for Treatment and signed form has been faxed to [facility].  EDP Charlesetta Shanks, MD and pt's nurse, Lovena Le, have been notified, and Lovena Le agrees to send original paperwork along with pt via [facility], and to call report to [phone].  Jalene Mullet, [place] Coordinator [phone]

## 2021-11-23 NOTE — Tx Team (Signed)
Initial Treatment Plan 11/23/2021 7:17 PM [name] ASN:[id number]    PATIENT STRESSORS: Health problems   Medication change or noncompliance     PATIENT STRENGTHS: Motivation for treatment/growth  Supportive family/friends    PATIENT IDENTIFIED PROBLEMS: Paranoia  Anxiety  Ineffective coping  skills                 DISCHARGE CRITERIA:  Adequate post-discharge living arrangements Safe-care adequate arrangements made  PRELIMINARY DISCHARGE PLAN: Attend aftercare/continuing care group Placement in alternative living arrangements  PATIENT/FAMILY INVOLVEMENT: This treatment plan has been presented to and reviewed with the patient, Paula Dickson, and/or family member.  The patient and family have been given the opportunity to ask questions and make suggestions.  Coralyn Mark [name], RN 11/23/2021, 7:17 PM

## 2021-11-24 DIAGNOSIS — F250 Schizoaffective disorder, bipolar type: Secondary | ICD-10-CM | POA: Diagnosis not present

## 2021-11-24 LAB — LIPID PANEL
Cholesterol: 127 mg/dL (ref 0–200)
HDL: 39 mg/dL — ABNORMAL LOW (ref >40)
LDL Cholesterol: 56 mg/dL (ref 0–99)
Total CHOL/HDL Ratio: 3.3 RATIO
Triglycerides: 161 mg/dL — ABNORMAL HIGH (ref <150)
VLDL: 32 mg/dL (ref 0–40)

## 2021-11-24 LAB — URINALYSIS, COMPLETE (UACMP) WITH MICROSCOPIC
Bilirubin Urine: NEGATIVE
Glucose, UA: NEGATIVE mg/dL
Hgb urine dipstick: NEGATIVE
Ketones, ur: NEGATIVE mg/dL
Leukocytes,Ua: NEGATIVE
Nitrite: NEGATIVE
Protein, ur: NEGATIVE mg/dL
Specific Gravity, Urine: 1.008 (ref 1.005–1.030)
pH: 5 (ref 5.0–8.0)

## 2021-11-24 LAB — COMPREHENSIVE METABOLIC PANEL
ALT: 24 U/L (ref 0–44)
AST: 21 U/L (ref 15–41)
Albumin: 4.6 g/dL (ref 3.5–5.0)
Alkaline Phosphatase: 69 U/L (ref 38–126)
Anion gap: 9 (ref 5–15)
BUN: 28 mg/dL — ABNORMAL HIGH (ref 6–20)
CO2: 26 mmol/L (ref 22–32)
Calcium: 9.6 mg/dL (ref 8.9–10.3)
Chloride: 103 mmol/L (ref 98–111)
Creatinine, Ser: 1.6 mg/dL — ABNORMAL HIGH (ref 0.44–1.00)
GFR, Estimated: 37 mL/min — ABNORMAL LOW (ref >60)
Glucose, Bld: 122 mg/dL — ABNORMAL HIGH (ref 70–99)
Potassium: 3.7 mmol/L (ref 3.5–5.1)
Sodium: 138 mmol/L (ref 135–145)
Total Bilirubin: 0.5 mg/dL (ref 0.3–1.2)
Total Protein: 7.5 g/dL (ref 6.5–8.1)

## 2021-11-24 LAB — TSH: TSH: 6.847 u[IU]/mL — ABNORMAL HIGH (ref 0.350–4.500)

## 2021-11-24 MED ORDER — OLANZAPINE 10 MG PO TABS: 10.0000 mg | ORAL_TABLET | Freq: Every day | ORAL | Status: DC

## 2021-11-24 MED ORDER — WHITE PETROLATUM EX OINT
TOPICAL_OINTMENT | CUTANEOUS | Status: AC
  Administered 2021-11-24: 1
  Filled 2021-11-24: qty 5

## 2021-11-24 MED ORDER — OLANZAPINE 5 MG PO TBDP
5.0000 mg | ORAL_TABLET | Freq: Three times a day (TID) | ORAL | Status: DC | PRN
  Administered 2021-11-24 – 2021-12-09 (×4): 5 mg via ORAL
  Filled 2021-11-24 (×4): qty 1

## 2021-11-24 MED ORDER — ZIPRASIDONE MESYLATE 20 MG IM SOLR
20.0000 mg | INTRAMUSCULAR | Status: DC | PRN
  Filled 2021-11-24: qty 20

## 2021-11-24 MED ORDER — ZIPRASIDONE HCL 40 MG PO CAPS
40.0000 mg | ORAL_CAPSULE | Freq: Two times a day (BID) | ORAL | Status: DC
  Administered 2021-11-24 – 2021-11-25 (×3): 40 mg via ORAL
  Filled 2021-11-24 (×8): qty 1

## 2021-11-24 MED ORDER — PANTOPRAZOLE SODIUM 20 MG PO TBEC
20.0000 mg | DELAYED_RELEASE_TABLET | Freq: Every day | ORAL | Status: DC
  Administered 2021-11-24 – 2021-12-23 (×27): 20 mg via ORAL
  Filled 2021-11-24 (×36): qty 1

## 2021-11-24 MED ORDER — WHITE PETROLATUM EX OINT
TOPICAL_OINTMENT | CUTANEOUS | Status: DC | PRN
  Administered 2021-11-26: 1 via TOPICAL

## 2021-11-24 MED ORDER — TEMAZEPAM 15 MG PO CAPS
15.0000 mg | ORAL_CAPSULE | Freq: Every day | ORAL | Status: DC
  Administered 2021-11-24 – 2021-11-26 (×3): 15 mg via ORAL
  Filled 2021-11-24 (×3): qty 1

## 2021-11-24 MED ORDER — LORAZEPAM 1 MG PO TABS
1.0000 mg | ORAL_TABLET | ORAL | Status: AC | PRN
  Administered 2021-11-25: 17:00:00 1 mg via ORAL
  Filled 2021-11-24: qty 1

## 2021-11-24 MED ORDER — METHIMAZOLE 10 MG PO TABS
10.0000 mg | ORAL_TABLET | Freq: Two times a day (BID) | ORAL | Status: DC
  Administered 2021-11-24 – 2021-11-29 (×10): 10 mg via ORAL
  Filled 2021-11-24 (×12): qty 1

## 2021-11-24 NOTE — BHH Group Notes (Signed)
Adult Psychoeducational Group Note  Date:  11/24/2021 Time:  10:46 PM  Group Topic/Focus:  Goals Group:   The focus of this group is to help patients establish daily goals to achieve during treatment and discuss how the patient can incorporate goal setting into their daily lives to aide in recovery.  Participation Level:  Active  Participation Quality:  Attentive  Affect:  Appropriate  Cognitive:  Appropriate  Insight: Good  Engagement in Group:  Off Topic  Modes of Intervention:  Discussion  Additional Comments  Dalene Carrow 11/24/2021, 10:46 PM

## 2021-11-24 NOTE — Progress Notes (Signed)
°   11/24/21 2200  Psych Admission Type (Psych Patients Only)  Admission Status Involuntary  Psychosocial Assessment  Patient Complaints Anxiety;Suspiciousness  Eye Contact Brief  Facial Expression Animated  Affect Preoccupied  Speech Soft  Interaction Assertive  Motor Activity Slow  Appearance/Hygiene In scrubs  Behavior Characteristics Cooperative  Mood Preoccupied;Pleasant;Suspicious  Aggressive Behavior  Effect No apparent injury  Thought Process  Coherency Disorganized;Tangential;Flight of ideas  Content Preoccupation;Religiosity  Delusions None reported or observed  Perception WDL  Hallucination None reported or observed  Judgment Poor  Confusion None  Danger to Self  Current suicidal ideation? Denies  Danger to Others  Danger to Others None reported or observed

## 2021-11-24 NOTE — Progress Notes (Signed)
°   11/24/21 0500  Sleep  Number of Hours 4.75

## 2021-11-24 NOTE — Progress Notes (Addendum)
Pt is animated flight of ideas, tangential with disorganized thought process and speech. Paranoid and suspicious of others. Requested to see what CSW was writings / assessment of about her. Demanded that female MHT monitor CSW interactions with her in the dayroom "I want you to watch everything because I don't want to sign my life away. I already have people stealing from me too". Observed on hall phone immediately post CSW assessment "I'm calling [place] to make my money is not missing". Confirmed she slept well last night with good appetite. Rates her depression 1/10, anxiety and hopelessness both 0/10 with normal energy and good concentration level on self inventory sheet. Pt observed with some tremors in bilateral hands. Left greater than right this shift, noted on AIMS assessment.  Support and reassurance provided to pt. All medications administered as ordered and effects monitored. Safety checks maintained at Q 15 minutes intervals. Pt encouraged to to voice concerns.  Pt tolerates meals and fluids well. Off unit for meals, returned without issues. Received PRN Vistaril 25 mg PO at 0751 for anxiety as evidenced by increased restlessness, pacing in hall. Calmer in group when reassessed at 0850.

## 2021-11-24 NOTE — BHH Group Notes (Signed)
[place] Group Notes:  (Nursing/MHT/Case Management/Adjunct)  Date:  11/24/2021  Time:  11:11 AM  Type of Therapy:   Orientation/Goals group  Participation Level:  Active  Participation Quality:  Appropriate  Affect:  Appropriate  Cognitive:  Disorganized  Insight:  Lacking  Engagement in Group:  Improving and Lacking  Modes of Intervention:  Discussion, Education, and Orientation  Summary of Progress/Problems: Pt goal for today is to be more assertive and have conversations with staff.   Cashius Grandstaff J Francessca Friis 11/24/2021, 11:11 AM

## 2021-11-24 NOTE — [facility] Counselor (Signed)
Adult Comprehensive Assessment   Patient ID: Paula Dickson, female   DOB: 1966/08/06, 55 y.o.   MRN: [id number] Information Source: Information source: Patient, Chart Review   Current Stressors:  Patient states their primary concerns and needs for treatment are:: "read medicine and it was more than I thought should be there" Patient states their goals for this hospitilization and ongoing recovery are:: "Stability"  Educational / Learning stressors: None reported Employment / Job issues: None reported Family Relationships: Pt states she loves them but doesn't want to be abused and used Museum/gallery curator / Lack of resources (include bankruptcy): Receives disability income Housing / Lack of housing: Patient reports that she lives alone since her father passed. Wants to remain where she she is living.  Physical health (include injuries & life threatening diseases): Hypertension Social relationships: None reported Substance abuse: None reported Bereavement / Loss: Patient reports that her mother died in 02-28-10 and her father died in 2019/05/01 from prostate cancer.    Living/Environment/Situation:  Living Arrangements: Alone Living conditions (as described by patient or guardian): "I love every minute of it" Who else lives in the home?: Just the patient  How long has patient lived in current situation?: Patient has always lived at home.  What is atmosphere in current home: Comfortable    Family History:  Marital status: Single Are you sexually active?: No What is your sexual orientation?: Heterosexual Has your sexual activity been affected by drugs, alcohol, medication, or emotional stress?: None reported  Children: None   Childhood History:  By whom was/is the patient raised?: Both parents Description of patient's relationship with caregiver when they were a child: Good relationship with parents  Patient's description of current relationship with people who raised him/her: Patient's  father and mother are now deceased.  How were you disciplined when you got in trouble as a child/adolescent?: None reported  Does patient have siblings?: Yes Number of Siblings: 7 Description of patient's current relationship with siblings: 5 brothers, 2 sisters;  patient is resentful of how much siblings are coming to the home and interfering with care for her father, telling her what to do Did patient suffer any verbal/emotional/physical/sexual abuse as a child?: No Has patient ever been sexually abused/assaulted/raped as an adolescent or adult?: No Was the patient ever a victim of a crime or a disaster?: No Witnessed domestic violence?: No Has patient been effected by domestic violence as an adult?: No   Education:  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 Currently a student?: No Learning disability?: No   Employment/Work Situation:   Employment situation: On disability Why is patient on disability: Mental health How long has patient been on disability: 3 years Patient's job has been impacted by current illness: No What is the longest time patient has a held a job?: 9 years  Did You Receive Any Psychiatric Treatment/Services While in the [employer]?: (No Armed forces logistics/support/administrative officer) Are There Guns or Other Weapons in [place]?: No   Financial Resources:   Financial resources: Teacher, early years/pre, Medicare Does patient have a Programmer, applications or guardian?: Yes Name of representative payee or guardian: Itxel Wickard [phone]    Alcohol/Substance Abuse:   What has been your use of drugs/alcohol within the last 12 months?: Denies Alcohol/Substance Abuse Treatment Hx: Denies past history Has alcohol/substance abuse ever caused legal problems?: No   Social Support System:   Patient's Community Support System: Fair  Describe Community Support System: Pt reports some friends and her siblings, at  times. Type of faith/religion: Darrick Meigs How does patient's faith  help to cope with current illness?: "Take it as it is"   Leisure/Recreation:   Leisure and Hobbies: "exercise, read, draw"   Strengths/Needs:   What is the patient's perception of their strengths?: "Love one another" Patient states they can use these personal strengths during their treatment to contribute to their recovery: "God knows best" Patient states these barriers may affect/interfere with their treatment: None Patient states these barriers may affect their return to the community: N/A Other important information patient would like considered in planning for their treatment: None   Discharge Plan:   Currently receiving community mental health services: Yes (From Whom)(States she gets services from [place]) Patient states concerns and preferences for aftercare planning are: Patient reports wanting to continue with [facility] and stated that she goes to [facility] on [address] for her hypertension Patient states they will know when they are safe and ready for discharge when: UTA Does patient have access to transportation?: No Does patient have financial barriers related to discharge medications?: No Patient description of barriers related to discharge medications: Has disability income and Medicare Plan for no access to transportation at discharge: Patient states that she will find a way home.  Plan for living situation after discharge: Pt will go back home  Will patient be returning to same living situation after discharge?: Yes   Summary/Recommendations:   Summary and Recommendations (to be completed by the evaluator): Paula Dickson was admitted due to bizarre behaviors and disorganized speech. Pt has a hx of schizoaffective disorder. Recent stressors include lack of support, medication confusion. Pt currently sees no outpatient providers. While here, Paula Dickson can benefit from crisis stabilization, medication management, therapeutic milieu, and referrals for services.

## 2021-11-24 NOTE — Progress Notes (Signed)
Recreation Therapy Notes  INPATIENT RECREATION THERAPY ASSESSMENT  Patient Details Name: Paula Dickson MRN: [id number] DOB: 09/02/1966 Today's Date: 11/24/2021       Information Obtained From: Patient  Able to Participate in Assessment/Interview: Yes  Patient Presentation: Confused (Suspicious (looking around like she sees something))  Reason for Admission (Per Patient): Other (Comments) (Pt stated she was taking medications but some were "masking" other things.)  Patient Stressors: Other (Comment) ("I just don't know about people sometimes".)  Coping Skills:   Music, Exercise, Art, Talk, Prayer  Leisure Interests (2+):  Individual - Reading, Music - Listen, Individual - Other (Comment) (Stretching)  Frequency of Recreation/Participation: Other (Comment) ("depends on what's going on")  Awareness of Community Resources:  Yes  Community Resources:  [place], [place]  Current Use: Yes  If no, Barriers?:    Expressed Interest in [place]: No  County of Residence:  [place]  Patient Main Form of Transportation: Other (Comment) (Sister)  Patient Strengths:  Love unconditional  Patient Identified Areas of Improvement:  Be more assertive  Patient Goal for Hospitalization:  "to get out in a timely fashion, go back to doing what I was doing because I got a brain"  Current SI (including self-harm):  No  Current HI:  No  Current AVH: No  Staff Intervention Plan: Group Attendance, Collaborate with Interdisciplinary Treatment Team  Consent to Intern Participation: N/A    [name], Vickki Muff, [place] 11/24/2021, 12:13 PM

## 2021-11-24 NOTE — H&P (Signed)
Psychiatric Admission Assessment Adult  Patient Identification: Paula Dickson MRN:  [id number] Date of Evaluation:  11/24/2021 Chief Complaint:  Severe manic bipolar 1 disorder with psychotic behavior ([place]) [F31.2] Principal Diagnosis: Schizoaffective disorder, bipolar type ([place]) Diagnosis:  Principal Problem:   Schizoaffective disorder, bipolar type ([place]) Active Problems:   Hyperthyroidism   Benign essential HTN   AKI (acute kidney injury) ([place])   History of Present Illness: Paula Dickson is a 55 year old female with a longstanding psychiatric history of schizoaffective disorder- bipolar type complicated by medication noncompliance and previous suicide attempt via OD who was admitted voluntarily from [facility] for bizarre behaviors and nonsensical speech after medication noncompliance. While in the ED, she was given Geodon x1 on 12/27, Ativan x1 when refusing meds and unable to de-escalate on 12/28.  On assessment today, patient is disorganized and tangential in speech. She answers some questions appropriately but then continues with her tangent. She is unable to give a reason as to why she came in, so history will come from collateral and admission notes. She reports some abdominal pain, but no further somatic complaints today. As well, she denies SI/HI/AVH, thought broadcasting, and thought insertion/withdrawal. She does endorse some paranoia about her family trying to take away her land.   Collateral: [name]- Spoke to [place] at the front desk. Patient sees [employer], NP. Last saw 10/06/21, prior to that August 2022, June 2022; she was previously lost to follow-up since 09/2020. She sees [facility] q 3 months. Confirmed patient's medications- Last prescribed Lorazepam 1 mg TID and Olanzapine 10 mg qHS.  Was getting Haldol Deconoate, last LAI 09/2020. After last admission (10/2020), did not do a med check.    Total Time spent with patient: 45 minutes  Past Psychiatric Hx: Previous Psych  Diagnoses: See HPI Prior inpatient treatment: Yes, multiple admissions here at [facility] and other facilities Current/prior outpatient treatment: Received treatment at [facility] Prior rehab hx: Denies History of suicide: Yes SA via OD in 2018 History of homicide: Denies Psychiatric medication history: Tegretol 150 mg TID, Haldol 10 mg, Restoril 30 mg, Clozapine 25 mg qAM and 100 mg qHS; from 01/2021 note: discharged from [facility] less than 3 weeks ago on risperidone, olanzapine, carbamazepine, Restoril, trazodone, and Geodon. Psychiatric medication compliance history: Noncompliant Neuromodulation history: Denies Current Psychiatrist: Darcey Nora, NP Current therapist: Denies  Is the patient at risk to self? Yes.    Has the patient been a risk to self in the past 6 months? Yes.    Has the patient been a risk to self within the distant past? Yes.    Is the patient a risk to others? No.  Has the patient been a risk to others in the past 6 months? No.  Has the patient been a risk to others within the distant past? No.    Alcohol Screening:  1. How often do you have a drink containing alcohol?: Never 2. How many drinks containing alcohol do you have on a typical day when you are drinking?: 1 or 2 3. How often do you have six or more drinks on one occasion?: Never AUDIT-C Score: 0 4. How often during the last year have you found that you were not able to stop drinking once you had started?: Never 5. How often during the last year have you failed to do what was normally expected from you because of drinking?: Never 6. How often during the last year have you needed a first drink in the morning to get yourself  going after a heavy drinking session?: Never 7. How often during the last year have you had a feeling of guilt of remorse after drinking?: Never 8. How often during the last year have you been unable to remember what happened the night before because you had been drinking?: Never 9. Have you  or someone else been injured as a result of your drinking?: No 10. Has a relative or friend or a doctor or another health worker been concerned about your drinking or suggested you cut down?: No Alcohol Use Disorder Identification Test Final Score (AUDIT): 0 Substance Abuse History in the last 12 months:  No. Consequences of Substance Abuse: Negative Substance Abuse Hx: Alcohol: Denies Tobacco: Denies Illicit drugs: Denies Rx drug abuse: Denies Rehab hx: Denies Previous Psychotropic Medications: Yes  Psychological Evaluations: Yes  Past Medical History:  Past Medical History:  Diagnosis Date   Bipolar affective disorder ([place])    Bipolar disorder ([place])    History of arthritis    History of chicken pox    History of depression    History of genital warts    history of heart murmur    History of high blood pressure    History of thyroid disease    History of UTI    Hypertension    Low TSH level 07/13/2017   Schizophrenia ([place])     Past Surgical History:  Procedure Laterality Date   ABLATION ON ENDOMETRIOSIS     CYST REMOVAL NECK     around 11 years ago /benign   MULTIPLE TOOTH EXTRACTIONS    Home Rx: Losartan 25 mg qd, Methimazole 10 mg BID, Protonix 20 mg , and Propranolol 20 mg BID Prior Hosp: Admitted for lithium toxicity 2018 Head trauma, LOC, concussions, seizures:  Allergies: NKDA LMP: Perimenopausal PCP: None recorded   Family History:  Family History  Problem Relation Age of Onset   Arthritis Father    Hyperlipidemia Father    High blood pressure Father    Diabetes Sister    Diabetes Mother    Diabetes Brother    Mental illness Brother    Alcohol abuse Paternal Uncle    Alcohol abuse Paternal Grandfather    Breast cancer Maternal Aunt    Breast cancer Paternal Aunt    High blood pressure Sister    Mental illness Other        runs in family   Family History: Medical: See above Psych: See above Psych Rx: Unsure SA/HA: Unable to assess 2/2 patient  psychosis Substance use family hx: Unable to assess 2/2 patient psychosis  Tobacco Screening:   Social History:  Unable to assess 2/2 patient psychosis Social History   Substance and Sexual Activity  Alcohol Use Not Currently     Social History   Substance and Sexual Activity  Drug Use Not Currently    Additional Social History:       Lab Results:  Results for orders placed or performed during the hospital encounter of 11/23/21 (from the past 21 hour(s))  Urinalysis, Complete w Microscopic     Status: Abnormal   Collection Time: 11/24/21  5:55 AM  Result Value Ref Range   Color, Urine STRAW (A) YELLOW   APPearance CLEAR CLEAR   Specific Gravity, Urine 1.008 1.005 - 1.030   pH 5.0 5.0 - 8.0   Glucose, UA NEGATIVE NEGATIVE mg/dL   Hgb urine dipstick NEGATIVE NEGATIVE   Bilirubin Urine NEGATIVE NEGATIVE   Ketones, ur NEGATIVE NEGATIVE mg/dL   Protein,  ur NEGATIVE NEGATIVE mg/dL   Nitrite NEGATIVE NEGATIVE   Leukocytes,Ua NEGATIVE NEGATIVE   Bacteria, UA RARE (A) NONE SEEN   Mucus PRESENT     Comment: Performed at [facility], [place] [address] [place], [place] [address]    Blood Alcohol level:  Lab Results  Component Value Date   [facility] <10 11/22/2021   ETH <10 64/33/2951    Metabolic Disorder Labs:  Lab Results  Component Value Date   HGBA1C 5.8 (H) 03/28/2021   MPG 119.76 03/28/2021   MPG 125.5 10/26/2020   Lab Results  Component Value Date   PROLACTIN 11.0 03/14/2019   Lab Results  Component Value Date   CHOL 173 10/26/2020   TRIG 273 (H) 03/23/2021   HDL 46 10/26/2020   CHOLHDL 3.8 10/26/2020   VLDL 32 10/26/2020   LDLCALC 95 10/26/2020   LDLCALC 76 02/08/2020    Current Medications: Current Facility-Administered Medications  Medication Dose Route Frequency Provider Last Rate Last Admin   acetaminophen (TYLENOL) tablet 650 mg  650 mg Oral Q6H PRN Ethelene Hal, NP       alum & mag hydroxide-simeth (MAALOX/MYLANTA)  200-200-20 MG/5ML suspension 30 mL  30 mL Oral Q4H PRN Ethelene Hal, NP       hydrOXYzine (ATARAX) tablet 25 mg  25 mg Oral TID PRN Ethelene Hal, NP   25 mg at 11/24/21 0751   OLANZapine zydis (ZYPREXA) disintegrating tablet 5 mg  5 mg Oral Q8H PRN Massengill, Ovid Curd, MD       And   LORazepam (ATIVAN) tablet 1 mg  1 mg Oral PRN Massengill, Ovid Curd, MD       And   ziprasidone (GEODON) injection 20 mg  20 mg Intramuscular PRN Massengill, Ovid Curd, MD       magnesium hydroxide (MILK OF MAGNESIA) suspension 30 mL  30 mL Oral Daily PRN Ethelene Hal, NP       methimazole (TAPAZOLE) tablet 10 mg  10 mg Oral BID Rosezetta Schlatter, MD       pantoprazole (PROTONIX) EC tablet 20 mg  20 mg Oral Daily Rosezetta Schlatter, MD       temazepam (RESTORIL) capsule 15 mg  15 mg Oral QHS Rosezetta Schlatter, MD       traZODone (DESYREL) tablet 50 mg  50 mg Oral QHS PRN Ethelene Hal, NP   50 mg at 11/23/21 2045   white petrolatum (VASELINE) gel   Topical PRN Rosezetta Schlatter, MD       ziprasidone (GEODON) capsule 40 mg  40 mg Oral BID WC Rosezetta Schlatter, MD       PTA Medications: Medications Prior to Admission  Medication Sig Dispense Refill Last Dose   losartan (COZAAR) 25 MG tablet Take 1 tablet (25 mg total) by mouth daily. 30 tablet 0 Past Month   methimazole (TAPAZOLE) 10 MG tablet Take 1 tablet (10 mg total) by mouth 2 (two) times daily. 60 tablet 2 Past Month   OLANZapine (ZYPREXA) 10 MG tablet Take 10 mg by mouth at bedtime.   Past Month   pantoprazole (PROTONIX) 20 MG tablet Take 20 mg by mouth daily.   Past Month   propranolol (INDERAL) 20 MG tablet Take 1 tablet (20 mg total) by mouth 2 (two) times daily. (Patient taking differently: Take 10 mg by mouth 2 (two) times daily.) 60 tablet 0 Past Month   LORazepam (ATIVAN) 1 MG tablet Take 1 mg by mouth 3 (three) times daily.  Musculoskeletal: Strength & Muscle Tone: within normal limits Gait & Station: shuffle, initially  shuffles, then gait normalizes Patient leans: N/A    Psychiatric Specialty Exam:  Presentation  General Appearance: Appropriate for Environment; Casual   Eye Contact:Fair   Speech:Clear and Coherent; Pressured   Speech Volume:Normal   Handedness:Right   Mood and Affect  Mood:Euthymic   Affect:Restricted    Thought Process  Thought Processes:Disorganized   Duration of Psychotic Symptoms: Greater than six months  Past Diagnosis of Schizophrenia or Psychoactive disorder: Yes  Descriptions of Associations:Tangential   Orientation:Partial   Thought Content:Illogical; Tangential   Hallucinations:Hallucinations: None (Denies today)   Ideas of Reference:None   Suicidal Thoughts:Suicidal Thoughts: No   Homicidal Thoughts:Homicidal Thoughts: No    Sensorium  Memory:Immediate Fair; Recent Fair   Judgment:Impaired   Insight:None    Executive Functions  Concentration:Poor   Attention Span:Poor   Recall:Poor   Fund of Knowledge:Poor   Language:Poor    Psychomotor Activity  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  Constitutional:      General: She is not in acute distress.    Appearance: She is normal weight. She is not toxic-appearing.  HENT:     Head: Normocephalic and atraumatic.  Cardiovascular:     Rate and Rhythm: Tachycardia present.  Pulmonary:     Effort: Pulmonary effort is normal.  Skin:    General: Skin is warm and dry.  Neurological:     Mental Status: She is alert. She is disoriented.     Motor: No weakness.     Gait: Gait abnormal.   Review of Systems  Constitutional:  Negative for weight loss.  Respiratory:  Negative for shortness of breath.   Cardiovascular:  Negative for chest pain.  Gastrointestinal:  Positive for abdominal pain.  Negative for constipation, diarrhea and nausea.  Genitourinary: Negative.   Musculoskeletal: Negative.   Neurological:  Negative for headaches.  Blood pressure 113/87, pulse (!) 117, temperature (!) 97.5 F (36.4 C), temperature source Oral, resp. rate 18, height 5' (1.524 m), weight 76.7 kg, SpO2 100 %. Body mass index is 33.01 kg/m.   ASSESSMENT: Principal Problem:   Schizoaffective disorder, bipolar type ([place]) Active Problems:   Hyperthyroidism   Benign essential HTN   AKI (acute kidney injury) ([place])    Patient is pleasantly disorganized on assessment. She is able to engage somewhat appropriately but overall disorganized. She denies SI/HI/AVH, thought broadcasting, and thought insertion/withdrawal, but reports some paranoia. Medications were  [facility] day 1.   Treatment Plan Summary: Daily contact with patient to assess and evaluate symptoms and progress in treatment and Medication management  Observation Level/Precautions:  15 minute checks  Laboratory:  As below  Psychotherapy:  Group and supportive psychotherapy  Medications:  As below  Consultations:  N/A  Discharge Concerns:  Medication optimization  Estimated LOS: 7-10 Days  Other:  N/A   Safety and Monitoring: INVOLUTARILY (by sister) admission to inpatient psychiatric unit for safety, stabilization and treatment Daily contact with patient to assess and evaluate symptoms and progress in treatment Patient's case to be discussed in multi-disciplinary team meeting Observation Level : q15 minute checks Vital signs: q12 hours Precautions: suicide, elopement, and assault  2. Psychiatric Problems #Schizoaffective disorder- Bipolar type Home meds: Zyprexa 10 mg qHS and Lorazepam 1 mg TID -- Initiate Geodon 40 BID. D/c home  Zyprexa due to patient recent history of falls; want to avoid anticholinergic side effects.   LFT WNL, A1c and lipids pending, Qtc 445 -- Initiate Restoril 15 mg qHS for insomnia -- Will consider adding  additional mood stabilizer as able to assess patient's response to Geodon as monotherapy.   3. Medical Management Covid negative CMP: Cr 1.26 CBC: unremarkable EtOH: <10 UDS: Pending TSH: Pending A1C: Pending Lipids: Pending   #HTN #Tachycardia Home meds: Losartan 25 mg and Propranolol 10 mg BID. MR BP 113/87, P 117 -Hold home Losartan 25 mg - Repeat vitals. If tachycardic, initiate home Propranolol 10 mg BID. Otherwise continue to hold.   #Hyperthyroidism -- Continue home Methimazole 10 mg BID  #AKI Cr 1.26 -Encourage PO fluid intake. Will recheck BMP tomorrow.   Physician Treatment Plan for Primary Diagnosis: Schizoaffective disorder, bipolar type ([place]) Long Term Goal(s): Improvement in symptoms so as ready for discharge  Short Term Goals: Ability to identify changes in lifestyle to reduce recurrence of condition will improve, Ability to verbalize feelings will improve, Ability to demonstrate self-control will improve, Ability to identify and develop effective coping behaviors will improve, Ability to maintain clinical measurements within normal limits will improve, and Compliance with prescribed medications will improve  Physician Treatment Plan for Secondary Diagnosis: Principal Problem:   Schizoaffective disorder, bipolar type ([place]) Active Problems:   Hyperthyroidism   Benign essential HTN   AKI (acute kidney injury) ([place])   Long Term Goal(s): Improvement in symptoms so as ready for discharge  Short Term Goals: Ability to identify changes in lifestyle to reduce recurrence of condition will improve, Ability to verbalize feelings will improve, Ability to demonstrate self-control will improve, Ability to identify and develop effective coping behaviors will improve, Ability to maintain clinical measurements within normal limits will improve, and Compliance with prescribed medications will improve  I certify that inpatient services furnished can reasonably be expected to  improve the patient's condition.    Rosezetta Schlatter, MD 12/29/20222:38 PM

## 2021-11-24 NOTE — [facility] Suicide Risk Assessment (Addendum)
Suicide Risk Assessment  Admission Assessment    [facility] Admission Suicide Risk Assessment   Nursing information obtained from:  Patient Demographic factors:  Low socioeconomic status, Living alone Current Mental Status: disorganized in speech and behaviors Loss Factors:  Financial problems / change in socioeconomic status Historical Factors:  Impulsivity; medication non-compliance Risk Reduction Factors:  Religious beliefs about death  Total Time spent with patient: 45 minutes Principal Problem: Schizoaffective disorder, bipolar type ([place]) Diagnosis:  Principal Problem:   Schizoaffective disorder, bipolar type ([place]) Active Problems:   Hyperthyroidism   Benign essential HTN   AKI (acute kidney injury) ([place])  Subjective Data: Paula Dickson is a 55 year old female with a longstanding psychiatric history of schizoaffective disorder- bipolar type complicated by medication noncompliance and previous suicide attempt via OD who was admitted voluntarily from [facility] for bizarre behaviors and nonsensical speech after medication noncompliance. While in the ED, she was given Geodon x1 on 12/27, Ativan x1 when refusing meds and unable to de-escalate on 12/28.   On assessment today, patient is disorganized and tangential in speech. She answers some questions appropriately but then continues with her tangent. She is unable to give a reason as to why she came in, so history will come from collateral and admission notes. She reports some abdominal pain, but no further somatic complaints today. As well, she denies SI/HI/AVH, thought broadcasting, and thought insertion/withdrawal. She does endorse some paranoia about her family trying to take away her land.    Collateral: [name]- Spoke to [place] at the front desk. Patient sees [employer], NP. Last saw 10/06/21, prior to that August 2022, June 2022; she was previously lost to follow-up since 09/2020. She sees Ale[facility] q 3 months. Confirmed patient's medications- Last  prescribed Lorazepam 1 mg TID and Olanzapine 10 mg qHS.  Was getting Haldol Deconoate, last LAI 09/2020. After last admission (10/2020), did not do a med check.  Continued Clinical Symptoms:  Alcohol Use Disorder Identification Test Final Score (AUDIT): 0 The "Alcohol Use Disorders Identification Test", Guidelines for Use in Primary Care, Second Edition.  World Pharmacologist [facility]). Score between 0-7:  no or low risk or alcohol related problems. Score between 8-15:  moderate risk of alcohol related problems. Score between 16-19:  high risk of alcohol related problems. Score 20 or above:  warrants further diagnostic evaluation for alcohol dependence and treatment.   CLINICAL FACTORS:  Schizoaffective: Bipolar type More than one psychiatric diagnosis Currently Psychotic Previous Psychiatric Diagnoses and Treatments Medical Diagnoses and Treatments/Surgeries   Musculoskeletal: Strength & Muscle Tone: within normal limits Gait & Station: unsteady, shuffle Patient leans: N/A  Psychiatric Specialty Exam:  Presentation  General Appearance: Appropriate for Environment; Casual  Eye Contact:Fair  Speech:Clear and Coherent; Pressured  Speech Volume:Normal  Handedness:Right   Mood and Affect  Mood:Euthymic  Affect:Restricted   Thought Process  Thought Processes:Disorganized  Descriptions of Associations:Tangential  Orientation:Partial  Thought Content:Illogical; Tangential  History of Schizophrenia/Schizoaffective disorder:Yes  Duration of Psychotic Symptoms:Greater than six months  Hallucinations:Hallucinations: None (Denies today)  Ideas of Reference:None  Suicidal Thoughts:Suicidal Thoughts: No  Homicidal Thoughts:Homicidal Thoughts: No   Sensorium  Memory:Immediate Fair; Recent Fair  Judgment:Impaired  Insight:None   Executive Functions  Concentration:Poor  Attention Span:Poor  Recall:Poor  Fund of  Knowledge:Poor  Language:Poor   Psychomotor Activity  Psychomotor Activity:Psychomotor Activity: Hotel manager  Assets:Housing; Social Support   Sleep  Sleep:Sleep: Poor    Physical Exam: Vitals and nursing note reviewed.  Constitutional:  General: She is not in acute distress.    Appearance: She is normal weight. She is not toxic-appearing.  HENT:     Head: Normocephalic and atraumatic.  Cardiovascular:     Rate and Rhythm: Tachycardia present.  Pulmonary:     Effort: Pulmonary effort is normal.  Skin:    General: Skin is warm and dry.  Neurological:     Mental Status: She is alert. She is disoriented.     Motor: No weakness.     Gait: Gait abnormal.    Review of Systems  Constitutional:  Negative for weight loss.  Respiratory:  Negative for shortness of breath.   Cardiovascular:  Negative for chest pain.  Gastrointestinal:  Positive for abdominal pain. Negative for constipation, diarrhea and nausea.  Genitourinary: Negative.   Musculoskeletal: Negative.   Neurological:  Negative for headaches.  Blood pressure 113/87, pulse (!) 117, temperature (!) 97.5 F (36.4 C), temperature source Oral, resp. rate 18, height 5' (1.524 m), weight 76.7 kg, SpO2 100 %. Body mass index is 33.01 kg/m.   COGNITIVE FEATURES THAT CONTRIBUTE TO RISK:  Loss of executive function    SUICIDE RISK:   Minimal: No identifiable suicidal ideation.  Patients presenting with no risk factors but with morbid ruminations; may be classified as minimal risk based on the severity of the depressive symptoms  PLAN OF CARE: ASSESSMENT: Principal Problem:   Schizoaffective disorder, bipolar type ([place]) Active Problems:   Hyperthyroidism   Benign essential HTN   AKI (acute kidney injury) ([place])     Patient is pleasantly disorganized on assessment. She is able to engage somewhat appropriately but overall disorganized. She denies SI/HI/AVH, thought broadcasting, and thought  insertion/withdrawal, but reports some paranoia. Medications were  [facility] day 1.    Treatment Plan Summary: Daily contact with patient to assess and evaluate symptoms and progress in treatment and Medication management   Observation Level/Precautions:  15 minute checks  Laboratory:  As below  Psychotherapy:  Group and supportive psychotherapy  Medications:  As below  Consultations:  N/A  Discharge Concerns:  Medication optimization  Estimated LOS: 7-10 Days  Other:  N/A    Safety and Monitoring: INVOLUTARILY (by sister) admission to inpatient psychiatric unit for safety, stabilization and treatment Daily contact with patient to assess and evaluate symptoms and progress in treatment Patient's case to be discussed in multi-disciplinary team meeting Observation Level : q15 minute checks Vital signs: q12 hours Precautions: suicide, elopement, and assault   2. Psychiatric Problems #Schizoaffective disorder- Bipolar type Home meds: Zyprexa 10 mg qHS and Lorazepam 1 mg TID -- Initiate Geodon 40 BID. D/c home Zyprexa due to patient recent history of falls; want to avoid anticholinergic side effects.              LFT WNL, A1c and lipids pending, Qtc 445 -- Initiate Restoril 15 mg qHS for insomnia -- Will consider adding additional mood stabilizer as able to assess patient's response to Geodon as monotherapy.     3. Medical Management Covid negative CMP: Cr 1.26 CBC: unremarkable EtOH: <10 UDS: Pending TSH: Pending A1C: Pending Lipids: Pending     #HTN #Tachycardia Home meds: Losartan 25 mg and Propranolol 10 mg BID. MR BP 113/87, P 117 -Hold home Losartan 25 mg - Repeat vitals. If tachycardic, initiate home Propranolol 10 mg BID. Otherwise continue to hold.    #Hyperthyroidism -- Continue home Methimazole 10 mg BID   #AKI Cr 1.26 -Encourage PO fluid intake. Will recheck BMP tomorrow.  Physician Treatment Plan for Primary Diagnosis: Schizoaffective disorder, bipolar  type ([place]) Long Term Goal(s): Improvement in symptoms so as ready for discharge   Short Term Goals: Ability to identify changes in lifestyle to reduce recurrence of condition will improve, Ability to verbalize feelings will improve, Ability to demonstrate self-control will improve, Ability to identify and develop effective coping behaviors will improve, Ability to maintain clinical measurements within normal limits will improve, and Compliance with prescribed medications will improve   Physician Treatment Plan for Secondary Diagnosis: Principal Problem:   Schizoaffective disorder, bipolar type ([place]) Active Problems:   Hyperthyroidism   Benign essential HTN   AKI (acute kidney injury) ([place])     Long Term Goal(s): Improvement in symptoms so as ready for discharge   Short Term Goals: Ability to identify changes in lifestyle to reduce recurrence of condition will improve, Ability to verbalize feelings will improve, Ability to demonstrate self-control will improve, Ability to identify and develop effective coping behaviors will improve, Ability to maintain clinical measurements within normal limits will improve, and Compliance with prescribed medications will improve  I certify that inpatient services furnished can reasonably be expected to improve the patient's condition.   Rosezetta Schlatter, MD 11/24/2021, 2:42 PM  Total Time Spent in Direct Patient Care:  I personally spent 60 minutes on the unit in direct patient care. The direct patient care time included face-to-face time with the patient, reviewing the patient's chart, communicating with other professionals, and coordinating care. Greater than 50% of this time was spent in counseling or coordinating care with the patient regarding goals of hospitalization, psycho-education, and discharge planning needs.  I have independently evaluated the patient during a face-to-face assessment on 11/24/21. I reviewed the patient's chart, and I participated  in key portions of the service. I discussed the case with the [employer], and I agree with the assessment and plan of care as documented in the [employer] note, as addended by me or notated below:  Edit to resident plan: 2. Psychiatric Problems #Schizoaffective disorder- Bipolar type -Discontinue Zyprexa 10 mg qHS  -Discontinue Lorazepam 1 mg TID - pt was not taking as prescribed (maybe one dose/day at bedtime). Monitor for w/d symptoms.  -Initiate Geodon 40 BID. We will stop zyprexa and switch to geodon to reduce anticholinergic burden and also decrease alpha antagonistic, which causes to pt dizziness with standing. Additionally, the zyprexa, at 10 mg once daily dose, was not effective for treating pt's current psychiatric symptoms.              LFT WNL, A1c and lipids pending, Qtc 445 - Initiate Restoril 15 mg qHS for insomnia - Will consider adding additional antipsychotic medication and/or mood stabilizer, as we assess the patient's response to Geodon as monotherapy. Per medical records, pt has required multiple antipsychotics (including clozapine) plus tegretol for psychiatric stability. We will hold off starting clozapine, as it seems that pt did not f/u after dc from hospital after last admission here, for management of clozapine prescribing.     Otherwise I agree with the note an plan.  Pt is psychotic (paranoid, disorganized thinking) and also has manic features (pressured speech, flight of ideas). Due to patient's disorganized thinking and speech, it was difficult to assess for other psychiatric symptoms, including depressive symptoms (although she denied pervasive sadness and anhedonia). She does elude to feeling anxious off and on, more recently. As in the note, she does have psychotic symptoms and symptoms of mania. Her diagnosis of schizoaffective disorder, bipolar  type, is appropriate for her presentation and after reviewing her medical record.   Janine Limbo,  MD Psychiatrist

## 2021-11-24 NOTE — Group Note (Signed)
Recreation Therapy Group Note   Group Topic:Leisure Education  Group Date: 11/24/2021 Start Time: 7564 End Time: 3329 Facilitators: [name], LRT,CTRS Location: [address] Dayroom   Goal Area(s) Addresses:  Patient will successfully identify benefits of leisure participation. Patient will successfully identify ways to access leisure activities. Patient will identify how leisure can help you cope.   Group Description:  LRT and patients went over what a public service announcement and leisure was.  Patients were then instructed to create a PSA that would convince someone to give leisure a try.  Patients were to define leisure, where it can be done, who can do leisure, how it can be used as a Technical sales engineer and the ultimate benefits of leisure.  Patients would then share their PSA with the rest of the group.   Affect/Mood: Bright   Participation Level: Engaged   Participation Quality: Minimal Cues   Behavior: Cooperative and Paranoid   Speech/Thought Process: Disorganized and Irrational   Insight: Lacking   Judgement: Lacking    Modes of Intervention: Art   Patient Response to Interventions:  Engaged   Education Outcome:  Acknowledges education and In group clarification offered    Clinical Observations/Individualized Feedback: Pt identified a PSA as something that "tells you things that can be bad and comes on late at night".  Pt was confused and would ramble about various topics such as medication, family, church and the people in the magazines.  Pt would also look around as if suspicious of something.  Pt was pleasant but unable to fully focus on activity.  Pt left before processing to meet with doctors.    Plan: Continue to engage patient in RT group sessions 2-3x/week.   [name], LRT,CTRS 11/24/2021 11:49 AM

## 2021-11-25 ENCOUNTER — Encounter (HOSPITAL_COMMUNITY): Payer: Self-pay

## 2021-11-25 DIAGNOSIS — F250 Schizoaffective disorder, bipolar type: Secondary | ICD-10-CM | POA: Diagnosis not present

## 2021-11-25 LAB — BASIC METABOLIC PANEL
Anion gap: 8 (ref 5–15)
BUN: 24 mg/dL — ABNORMAL HIGH (ref 6–20)
CO2: 26 mmol/L (ref 22–32)
Calcium: 9.6 mg/dL (ref 8.9–10.3)
Chloride: 105 mmol/L (ref 98–111)
Creatinine, Ser: 1.48 mg/dL — ABNORMAL HIGH (ref 0.44–1.00)
GFR, Estimated: 41 mL/min — ABNORMAL LOW (ref >60)
Glucose, Bld: 113 mg/dL — ABNORMAL HIGH (ref 70–99)
Potassium: 3.7 mmol/L (ref 3.5–5.1)
Sodium: 139 mmol/L (ref 135–145)

## 2021-11-25 LAB — CK: Total CK: 488 U/L — ABNORMAL HIGH (ref 38–234)

## 2021-11-25 LAB — HEMOGLOBIN A1C
Hgb A1c MFr Bld: 6.1 % — ABNORMAL HIGH (ref 4.8–5.6)
Mean Plasma Glucose: 128.37 mg/dL

## 2021-11-25 MED ORDER — ZIPRASIDONE HCL 60 MG PO CAPS
60.0000 mg | ORAL_CAPSULE | Freq: Two times a day (BID) | ORAL | Status: DC
  Administered 2021-11-25 – 2021-11-26 (×2): 60 mg via ORAL
  Filled 2021-11-25 (×6): qty 1

## 2021-11-25 MED ORDER — PROPRANOLOL HCL 10 MG PO TABS
10.0000 mg | ORAL_TABLET | Freq: Two times a day (BID) | ORAL | Status: DC
  Administered 2021-11-25 – 2021-11-28 (×7): 10 mg via ORAL
  Filled 2021-11-25 (×12): qty 1

## 2021-11-25 MED ORDER — ZIPRASIDONE HCL 60 MG PO CAPS
60.0000 mg | ORAL_CAPSULE | Freq: Two times a day (BID) | ORAL | Status: DC
  Filled 2021-11-25 (×2): qty 1

## 2021-11-25 NOTE — BH IP Treatment Plan (Signed)
Interdisciplinary Treatment and Diagnostic Plan Update  11/25/2021 Time of Session: 1:40pm Paula Dickson MRN: [id number]  Principal Diagnosis: Schizoaffective disorder, bipolar type ([place])  Secondary Diagnoses: Principal Problem:   Schizoaffective disorder, bipolar type ([place]) Active Problems:   Hyperthyroidism   Benign essential HTN   AKI (acute kidney injury) ([place])   Current Medications:  Current Facility-Administered Medications  Medication Dose Route Frequency Provider Last Rate Last Admin   acetaminophen (TYLENOL) tablet 650 mg  650 mg Oral Q6H PRN Ethelene Hal, NP       alum & mag hydroxide-simeth (MAALOX/MYLANTA) 200-200-20 MG/5ML suspension 30 mL  30 mL Oral Q4H PRN Ethelene Hal, NP       hydrOXYzine (ATARAX) tablet 25 mg  25 mg Oral TID PRN Ethelene Hal, NP   25 mg at 11/25/21 1253   OLANZapine zydis (ZYPREXA) disintegrating tablet 5 mg  5 mg Oral Q8H PRN Massengill, Ovid Curd, MD   5 mg at 11/25/21 1253   And   LORazepam (ATIVAN) tablet 1 mg  1 mg Oral PRN Massengill, Ovid Curd, MD       And   ziprasidone (GEODON) injection 20 mg  20 mg Intramuscular PRN Massengill, Ovid Curd, MD       magnesium hydroxide (MILK OF MAGNESIA) suspension 30 mL  30 mL Oral Daily PRN Ethelene Hal, NP       methimazole (TAPAZOLE) tablet 10 mg  10 mg Oral BID Rosezetta Schlatter, MD   10 mg at 11/25/21 0811   pantoprazole (PROTONIX) EC tablet 20 mg  20 mg Oral Daily Rosezetta Schlatter, MD   20 mg at 11/25/21 3419   propranolol (INDERAL) tablet 10 mg  10 mg Oral BID Massengill, Ovid Curd, MD   10 mg at 11/25/21 1003   temazepam (RESTORIL) capsule 15 mg  15 mg Oral QHS Rosezetta Schlatter, MD   15 mg at 11/24/21 2036   traZODone (DESYREL) tablet 50 mg  50 mg Oral QHS PRN Ethelene Hal, NP   50 mg at 11/24/21 2036   white petrolatum (VASELINE) gel   Topical PRN Rosezetta Schlatter, MD       ziprasidone (GEODON) capsule 60 mg  60 mg Oral BID WC Pashayan, Redgie Grayer, MD        PTA Medications: Medications Prior to Admission  Medication Sig Dispense Refill Last Dose   losartan (COZAAR) 25 MG tablet Take 1 tablet (25 mg total) by mouth daily. 30 tablet 0 Past Month   methimazole (TAPAZOLE) 10 MG tablet Take 1 tablet (10 mg total) by mouth 2 (two) times daily. 60 tablet 2 Past Month   OLANZapine (ZYPREXA) 10 MG tablet Take 10 mg by mouth at bedtime.   Past Month   pantoprazole (PROTONIX) 20 MG tablet Take 20 mg by mouth daily.   Past Month   propranolol (INDERAL) 20 MG tablet Take 1 tablet (20 mg total) by mouth 2 (two) times daily. (Patient taking differently: Take 10 mg by mouth 2 (two) times daily.) 60 tablet 0 Past Month   LORazepam (ATIVAN) 1 MG tablet Take 1 mg by mouth 3 (three) times daily.       Patient Stressors: Health problems   Medication change or noncompliance    Patient Strengths: Motivation for treatment/growth  Supportive family/friends   Treatment Modalities: Medication Management, Group therapy, Case management,  1 to 1 session with clinician, Psychoeducation, Recreational therapy.   Physician Treatment Plan for Primary Diagnosis: Schizoaffective disorder, bipolar type ([place]) Long Term Goal(s): Improvement in  symptoms so as ready for discharge   Short Term Goals: Ability to identify changes in lifestyle to reduce recurrence of condition will improve Ability to verbalize feelings will improve Ability to demonstrate self-control will improve Ability to identify and develop effective coping behaviors will improve Ability to maintain clinical measurements within normal limits will improve Compliance with prescribed medications will improve  Medication Management: Evaluate patient's response, side effects, and tolerance of medication regimen.  Therapeutic Interventions: 1 to 1 sessions, Unit Group sessions and Medication administration.  Evaluation of Outcomes: Not Met  Physician Treatment Plan for Secondary Diagnosis: Principal  Problem:   Schizoaffective disorder, bipolar type ([place]) Active Problems:   Hyperthyroidism   Benign essential HTN   AKI (acute kidney injury) ([place])  Long Term Goal(s): Improvement in symptoms so as ready for discharge   Short Term Goals: Ability to identify changes in lifestyle to reduce recurrence of condition will improve Ability to verbalize feelings will improve Ability to demonstrate self-control will improve Ability to identify and develop effective coping behaviors will improve Ability to maintain clinical measurements within normal limits will improve Compliance with prescribed medications will improve     Medication Management: Evaluate patient's response, side effects, and tolerance of medication regimen.  Therapeutic Interventions: 1 to 1 sessions, Unit Group sessions and Medication administration.  Evaluation of Outcomes: Not Met   RN Treatment Plan for Primary Diagnosis: Schizoaffective disorder, bipolar type ([place]) Long Term Goal(s): Knowledge of disease and therapeutic regimen to maintain health will improve  Short Term Goals: Ability to remain free from injury will improve, Ability to verbalize frustration and anger appropriately will improve, Ability to demonstrate self-control, Ability to participate in decision making will improve, Ability to verbalize feelings will improve, Ability to disclose and discuss suicidal ideas, Ability to identify and develop effective coping behaviors will improve, and Compliance with prescribed medications will improve  Medication Management: RN will administer medications as ordered by provider, will assess and evaluate patient's response and provide education to patient for prescribed medication. RN will report any adverse and/or side effects to prescribing provider.  Therapeutic Interventions: 1 on 1 counseling sessions, Psychoeducation, Medication administration, Evaluate responses to treatment, Monitor vital signs and CBGs as ordered,  Perform/monitor CIWA, COWS, AIMS and Fall Risk screenings as ordered, Perform wound care treatments as ordered.  Evaluation of Outcomes: Not Met   LCSW Treatment Plan for Primary Diagnosis: Schizoaffective disorder, bipolar type ([place]) Long Term Goal(s): Safe transition to appropriate next level of care at discharge, Engage patient in therapeutic group addressing interpersonal concerns.  Short Term Goals: Engage patient in aftercare planning with referrals and resources, Increase social support, Increase ability to appropriately verbalize feelings, Increase emotional regulation, and Facilitate acceptance of mental health diagnosis and concerns  Therapeutic Interventions: Assess for all discharge needs, 1 to 1 time with Social worker, Explore available resources and support systems, Assess for adequacy in community support network, Educate family and significant other(s) on suicide prevention, Complete Psychosocial Assessment, Interpersonal group therapy.  Evaluation of Outcomes: Not Met   Progress in Treatment: Attending groups: Yes. Participating in groups: Yes. and No. Taking medication as prescribed: Yes. Toleration medication: Yes. Family/Significant other contact made: No, will contact:  sister Patient understands diagnosis: Yes. and No. Discussing patient identified problems/goals with staff: No. Medical problems stabilized or resolved: Yes. Denies suicidal/homicidal ideation: Yes. Issues/concerns per patient self-inventory: No.   New problem(s) identified: No, Describe:  none  New Short Term/Long Term Goal(s): medication stabilization, elimination of SI thoughts, development of  comprehensive mental wellness plan.    Patient Goals:  Declined to participate   Discharge Plan or Barriers: Patient recently admitted. CSW will continue to follow and assess for appropriate referrals and possible discharge planning.    Reason for Continuation of Hospitalization: Delusions   Hallucinations Medication stabilization  Estimated Length of Stay: 3-5 days   Scribe for Treatment Team: Vassie Moselle, LCSW 11/25/2021 1:54 PM

## 2021-11-25 NOTE — Progress Notes (Signed)
Pt coming out of her room numerous times needing redirection, pt disorganized

## 2021-11-25 NOTE — Progress Notes (Signed)
D:  Paula Dickson has been up and down the hall this evening.  She is requesting multiple items.  She remains tangential and disorganized.  She does require frequent redirection.  She was pleasant and cooperative.  No agitation noted.  She denied SI/HI or AVH.  She remains suspicious and is afraid that things are going to catch on fire in her room.  She had staff assist her with adjusting her air conditioner to "not too hot" so this won't happen.  She has been up and down much of the night but has not been disruptive.   A:  1:1 with RN for support and encouragement.  Medications given as ordered.  Q 15 minute checks maintained for safety.  Encouraged participation in group and unit activities. R:  She remains safe on the unit. She is currently up and walking around her room.  We will continue to monitor the progress towards her goals.    11/25/21 2058  Psych Admission Type (Psych Patients Only)  Admission Status Voluntary  Psychosocial Assessment  Patient Complaints Anxiety;Insomnia;Suspiciousness  Eye Contact Fair  Facial Expression Animated  Affect Preoccupied  Speech Soft;Tangential  Interaction Assertive  Motor Activity Slow  Appearance/Hygiene Unremarkable  Behavior Characteristics Cooperative;Pacing  Mood Labile;Suspicious;Preoccupied;Pleasant  Thought Process  Coherency Disorganized;Tangential;Flight of ideas  Content Preoccupation;Religiosity  Delusions None reported or observed  Perception WDL  Hallucination None reported or observed  Judgment Poor  Confusion None  Danger to Self  Current suicidal ideation? Denies  Danger to Others  Danger to Others None reported or observed

## 2021-11-25 NOTE — Progress Notes (Signed)
Pt remains disorganized, hyper-verbal, hyperactive with increase mood lability. Observed to be verbally abusive, intermittent verbal outburst in milieu targeted towards staff, intrusive towards peers and staff. Vitals remains slightly elevated. Pt seen by hospitalist this shift related to increased Creatinine level, new order received to repeat labs tonight. Pt has been cooperative with lab draw this evening. However, urine sample pending despite multiple prompts / coaching "I'm not ready yet. I don't want nobody watching me". Received PRN Vistaril, Zydis and Ativan (see EMAR) as ordered with very minimal effect. Tolerates meals and fluids well. All medications administered as ordered. Continued support and encouragement provided to pt. Safety checks maintained at Q 15 minutes intervals. Pt continues to need frequent verbal redirections. Off unit for meals and recreational activities, returned without issues.

## 2021-11-25 NOTE — Group Note (Signed)
LCSW Group Therapy Note  Group Date: 11/25/2021 Start Time: 1100 End Time: 1200   Type of Therapy and Topic:  Group Therapy - Healthy vs Unhealthy Coping Skills  Participation Level:  Minimal   Description of Group The focus of this group was to determine what unhealthy coping techniques typically are used by group members and what healthy coping techniques would be helpful in coping with various problems. Patients were guided in becoming aware of the differences between healthy and unhealthy coping techniques. Patients were asked to identify 2-3 healthy coping skills they would like to learn to use more effectively.  Therapeutic Goals Patients learned that coping is what human beings do all day long to deal with various situations in their lives Patients defined and discussed healthy vs unhealthy coping techniques Patients identified their preferred coping techniques and identified whether these were healthy or unhealthy Patients determined 2-3 healthy coping skills they would like to become more familiar with and use more often. Patients provided support and ideas to each other   Summary of Patient Progress:  During group, Paula Dickson expressed jogging and journaling are her coping skills. Patient proved open to input from peers and feedback from [name]. Patient demonstrated minimal insight into the subject matter, was respectful of peers, and participated throughout the entire session.   Therapeutic Modalities Cognitive Behavioral Therapy Motivational [place], LCSW 11/25/2021  11:34 AM

## 2021-11-25 NOTE — Progress Notes (Signed)
°   11/25/21 0500  Sleep  Number of Hours 5

## 2021-11-25 NOTE — Consult Note (Addendum)
Medical Consultation  Paula Dickson IRW:[id number] DOB: Dec 14, 1965 DOA: 11/23/2021 PCP: Merryl Hacker, No   Requesting physician: Dr. Kai Levins Date of consultation: 11/25/21 Reason for consultation: AKI  Impression/Recommendations AKI     - her history is a little confusing d/t her psychiatric diagnosis, but she reports a history of proteinuria in the past; check UA     - BP has been ok, PO intake has been ok     - hold her home ARB     - repeat labs for tonight to assess renal fxn; if Scr increasing, recommend sending to ED for fluids and a renal US  TRH will not follow-up tomorrow. Please reconsult if her renal function continues to be a problem. Thank you.   Chief Complaint: acute encephalopathy  HPI:  Paula Dickson is a 55 y.o. female with medical history significant of bipolar d/o, schizophrenia. She presented to [facility] with acute on chronic encephalopathy. She was apparently giving nonsensical speech and acting erratically. She apparently is well known to the team d/t her non-compliance on her medications. [facility] was called by [facility] about patient's renal status. Her baseline Scr is around 0.8. At admission 3 days ago, her Scr was 1.2. Today it is 1.6. She has not had any issues with hypotension. She is not on nephrotoxic meds. They have not watched her UOP, but there have been no complaints of retention. She is maintaining PO intake per their report.  After speaking with the patient, she reports that she has good PO intake. She doesn't think that her UOP has worsened. She denies NSAID use in the last several days. She reports that she had a history of proteinuria in the past, but she has not followed up with anyone on it. She otherwise denies any history related to her kidneys.      Review of Systems:  Remainder of ROS is negative for all not mentioned in HPI.  Past Medical History:  Diagnosis Date   Bipolar affective disorder ([place])    Bipolar disorder ([place])    History of  arthritis    History of chicken pox    History of depression    History of genital warts    history of heart murmur    History of high blood pressure    History of thyroid disease    History of UTI    Hypertension    Low TSH level 07/13/2017   Schizophrenia ([place])    Past Surgical History:  Procedure Laterality Date   ABLATION ON ENDOMETRIOSIS     CYST REMOVAL NECK     around 11 years ago /benign   MULTIPLE TOOTH EXTRACTIONS     Social History:  reports that she has never smoked. She has never used smokeless tobacco. She reports that she does not currently use alcohol. She reports that she does not currently use drugs.  No Known Allergies Family History  Problem Relation Age of Onset   Arthritis Father    Hyperlipidemia Father    High blood pressure Father    Diabetes Sister    Diabetes Mother    Diabetes Brother    Mental illness Brother    Alcohol abuse Paternal Uncle    Alcohol abuse Paternal Grandfather    Breast cancer Maternal Aunt    Breast cancer Paternal Aunt    High blood pressure Sister    Mental illness Other        runs in family    Prior to Admission medications  Medication Sig Start Date End Date Taking? Authorizing Provider  losartan (COZAAR) 25 MG tablet Take 1 tablet (25 mg total) by mouth daily. 03/30/21 11/22/22 Yes Donne Hazel, MD  methimazole (TAPAZOLE) 10 MG tablet Take 1 tablet (10 mg total) by mouth 2 (two) times daily. 02/20/21 11/22/22 Yes Dahal, Marlowe Aschoff, MD  OLANZapine (ZYPREXA) 10 MG tablet Take 10 mg by mouth at bedtime. 11/04/21  Yes [provider]  pantoprazole (PROTONIX) 20 MG tablet Take 20 mg by mouth daily. 11/07/21  Yes [provider]  propranolol (INDERAL) 20 MG tablet Take 1 tablet (20 mg total) by mouth 2 (two) times daily. Patient taking differently: Take 10 mg by mouth 2 (two) times daily. 03/30/21 11/22/22 Yes Donne Hazel, MD  LORazepam (ATIVAN) 1 MG tablet Take 1 mg by mouth 3 (three) times daily. 11/14/21    [provider]   Physical Exam: Blood pressure 130/80, pulse (!) 102, temperature 97.9 F (36.6 C), temperature source Oral, resp. rate 18, height 5' (1.524 m), weight 76.7 kg, SpO2 99 %. Vitals:   11/25/21 0605 11/25/21 1003  BP: 118/89 130/80  Pulse: (!) 108 (!) 102  Resp:    Temp:    SpO2: 99%     General: 55 y.o. female resting in chair in NAD Eyes: normal sclera ENMT: Nares patent w/o discharge, orophaynx clear, dentition normal, ears w/o discharge/lesions/ulcers Neck: Supple, trachea midline Cardiovascular: RRR, +S1, S2, no m/g/r, equal pulses throughout Respiratory: CTABL, no w/r/r, normal WOB GI: BS+, ND, no masses noted, no organomegaly noted Neuro: A&O x 3, no focal deficits  Labs on Admission:  Basic Metabolic Panel: Recent Labs  Lab 11/22/21 1916 11/24/21 1825  NA 145 138  K 3.6 3.7  CL 113* 103  CO2 22 26  GLUCOSE 113* 122*  BUN 19 28*  CREATININE 1.26* 1.60*  CALCIUM 10.1 9.6   Liver Function Tests: Recent Labs  Lab 11/22/21 1916 11/24/21 1825  AST 19 21  ALT 19 24  ALKPHOS 64 69  BILITOT 1.0 0.5  PROT 7.9 7.5  ALBUMIN 4.7 4.6   No results for input(s): LIPASE, AMYLASE in the last 168 hours. No results for input(s): AMMONIA in the last 168 hours. CBC: Recent Labs  Lab 11/22/21 1916  WBC 6.0  NEUTROABS 3.8  HGB 12.3  HCT 37.9  MCV 90.9  PLT 227   Cardiac Enzymes: No results for input(s): CKTOTAL, CKMB, CKMBINDEX, TROPONINI in the last 168 hours. BNP: Invalid input(s): POCBNP CBG: No results for input(s): GLUCAP in the last 168 hours.  Radiological Exams on Admission: No results found.  Time spent: 15 minutes  [facility]  If 7PM-7AM, please contact night-coverage [email or web address] 11/25/2021, 3:02 PM

## 2021-11-25 NOTE — Progress Notes (Addendum)
[facility] MD Progress Note  11/25/2021 2:12 PM Paula Dickson  MRN:  [id number] Subjective:    Paula Dickson is a 55 yr old female who presents with bizarre behaviors and nonsensical speech after medication noncompliance.  PPHx is significant for Schizoaffective Disorder- Bipolar type complicated by medication noncompliance and previous suicide attempt (OD in 2018), multiple hospitalizations (latest 11/21 [facility]).   Case was discussed in the multidisciplinary team. MAR was reviewed and patient was compliant with medications.  She required PRN Zyprexa, Atarax, and Trazodone yesterday.   Psychiatric Team made the following recommendations yesterday: -Start Restoril 15 mg QHS -Start Geodon 40 mg BID -Stop Zyprexa -Stop Lorazepam     Throughout the interview today patient would be unable to answer questions with more than 4-5 words before becoming tangential, nonsensical and unintelligible.  When questions were yes or no she seemed to be more concrete and sure of her answers.  During the entire interview she made poor eye contact continually looking around the room suggesting responding to internal stimuli.  She would constantly end her sentences with "know what I mean."  On interview today patient reports that she is doing fine.  She reports that she slept okay last night.  She reports that her appetite is doing okay.  She reports no SI, HI, or AVH (see paragraph above).  Asked her how she thought her medications were doing for her and she stated she thought they were doing fine.  Tried to discuss are concerned about the increase in her creatinine.  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  Discussed that I would call the hospitalist to discuss next steps.  At first she asked where she would have to go then to be seen but I reiterated that it would be a phone call  and she seemed to understand this.  Discussed that because her blood pressure and heart rate were increasing we will be restarting the propranolol and she was okay with this.  When asked if she had any concerns she paused for at least 5 seconds before answering in gibberish and the phrase "children of the world" was said during this.  She reports no other concerns at present.   Spoke with hospitalist, Dr. Marylyn Ishihara, about patient's increase in creatinine.  After chart review with no nephrotoxic medications they made the recommendation to recheck a BMP tonight to trend and that if it continues to increase send her to [facility] ED for IV fluids and renal ultrasound.   Principal Problem: Schizoaffective disorder, bipolar type ([place]) Diagnosis: Principal Problem:   Schizoaffective disorder, bipolar type ([place]) Active Problems:   Hyperthyroidism   Benign essential HTN   AKI (acute kidney injury) ([place])  Total Time spent with patient: 30 minutes  Past Psychiatric History: Schizoaffective Disorder- Bipolar type complicated by medication noncompliance and previous suicide attempt (OD in 2018), multiple hospitalizations (latest 11/21 [facility]).  Past Medical History:  Past Medical History:  Diagnosis Date   Bipolar affective disorder ([place])    Bipolar disorder ([place])    History of arthritis    History of chicken pox    History of depression    History of genital warts    history of heart murmur    History of high blood pressure    History of thyroid disease    History of UTI  Hypertension    Low TSH level 07/13/2017   Schizophrenia ([place])     Past Surgical History:  Procedure Laterality Date   ABLATION ON ENDOMETRIOSIS     CYST REMOVAL NECK     around 11 years ago /benign   MULTIPLE TOOTH EXTRACTIONS     Family History:  Family History  Problem Relation Age of Onset   Arthritis Father    Hyperlipidemia Father    High blood pressure Father    Diabetes Sister    Diabetes Mother    Diabetes Brother     Mental illness Brother    Alcohol abuse Paternal Uncle    Alcohol abuse Paternal Grandfather    Breast cancer Maternal Aunt    Breast cancer Paternal Aunt    High blood pressure Sister    Mental illness Other        runs in family   Family Psychiatric  History: Paternal Grandfather - EtOH abuse Paternal Uncle - EtOH abuse Brother - Unspecified mental illness Multiple members - mental illness ("runs in the family") Social History:  Social History   Substance and Sexual Activity  Alcohol Use Not Currently     Social History   Substance and Sexual Activity  Drug Use Not Currently    Social History   Socioeconomic History   Marital status: Single    Spouse name: Not on file   Number of children: Not on file   Years of education: Not on file   Highest education level: Patient refused  Occupational History   Not on file  Tobacco Use   Smoking status: Never   Smokeless tobacco: Never  Vaping Use   Vaping Use: Never used  Substance and Sexual Activity   Alcohol use: Not Currently   Drug use: Not Currently   Sexual activity: Not Currently  Other Topics Concern   Not on file  Social History Narrative   ** Merged History Encounter **       Social Determinants of Health   Financial Resource Strain: Not on file  Food Insecurity: Not on file  Transportation Needs: Not on file  Physical Activity: Not on file  Stress: Not on file  Social Connections: Not on file   Additional Social History:                         Sleep: Fair  Appetite:  Fair  Current Medications: Current Facility-Administered Medications  Medication Dose Route Frequency Provider Last Rate Last Admin   acetaminophen (TYLENOL) tablet 650 mg  650 mg Oral Q6H PRN Ethelene Hal, NP       alum & mag hydroxide-simeth (MAALOX/MYLANTA) 200-200-20 MG/5ML suspension 30 mL  30 mL Oral Q4H PRN Ethelene Hal, NP       hydrOXYzine (ATARAX) tablet 25 mg  25 mg Oral TID PRN Ethelene Hal, NP   25 mg at 11/25/21 1253   OLANZapine zydis (ZYPREXA) disintegrating tablet 5 mg  5 mg Oral Q8H PRN Massengill, Ovid Curd, MD   5 mg at 11/25/21 1253   And   LORazepam (ATIVAN) tablet 1 mg  1 mg Oral PRN Massengill, Ovid Curd, MD       And   ziprasidone (GEODON) injection 20 mg  20 mg Intramuscular PRN Massengill, Nathan, MD       magnesium hydroxide (MILK OF MAGNESIA) suspension 30 mL  30 mL Oral Daily PRN Ethelene Hal, NP       methimazole (  TAPAZOLE) tablet 10 mg  10 mg Oral BID Rosezetta Schlatter, MD   10 mg at 11/25/21 9937   pantoprazole (PROTONIX) EC tablet 20 mg  20 mg Oral Daily Rosezetta Schlatter, MD   20 mg at 11/25/21 1696   propranolol (INDERAL) tablet 10 mg  10 mg Oral BID Janine Limbo, MD   10 mg at 11/25/21 1003   temazepam (RESTORIL) capsule 15 mg  15 mg Oral QHS Rosezetta Schlatter, MD   15 mg at 11/24/21 2036   traZODone (DESYREL) tablet 50 mg  50 mg Oral QHS PRN Ethelene Hal, NP   50 mg at 11/24/21 2036   white petrolatum (VASELINE) gel   Topical PRN Rosezetta Schlatter, MD       ziprasidone (GEODON) capsule 60 mg  60 mg Oral BID WC Briant Cedar, MD        Lab Results:  Results for orders placed or performed during the hospital encounter of 11/23/21 (from the past 48 hour(s))  Urinalysis, Complete w Microscopic     Status: Abnormal   Collection Time: 11/24/21  5:55 AM  Result Value Ref Range   Color, Urine STRAW (A) YELLOW   APPearance CLEAR CLEAR   Specific Gravity, Urine 1.008 1.005 - 1.030   pH 5.0 5.0 - 8.0   Glucose, UA NEGATIVE NEGATIVE mg/dL   Hgb urine dipstick NEGATIVE NEGATIVE   Bilirubin Urine NEGATIVE NEGATIVE   Ketones, ur NEGATIVE NEGATIVE mg/dL   Protein, ur NEGATIVE NEGATIVE mg/dL   Nitrite NEGATIVE NEGATIVE   Leukocytes,Ua NEGATIVE NEGATIVE   Bacteria, UA RARE (A) NONE SEEN   Mucus PRESENT     Comment: Performed at [facility], [place] [address] [place], [place] [address]  Comprehensive metabolic  panel     Status: Abnormal   Collection Time: 11/24/21  6:25 PM  Result Value Ref Range   Sodium 138 135 - 145 mmol/L   Potassium 3.7 3.5 - 5.1 mmol/L   Chloride 103 98 - 111 mmol/L   CO2 26 22 - 32 mmol/L   Glucose, Bld 122 (H) 70 - 99 mg/dL    Comment: Glucose reference range applies only to samples taken after fasting for at least 8 hours.   BUN 28 (H) 6 - 20 mg/dL   Creatinine, Ser 1.60 (H) 0.44 - 1.00 mg/dL   Calcium 9.6 8.9 - 10.3 mg/dL   Total Protein 7.5 6.5 - 8.1 g/dL   Albumin 4.6 3.5 - 5.0 g/dL   AST 21 15 - 41 U/L   ALT 24 0 - 44 U/L   Alkaline Phosphatase 69 38 - 126 U/L   Total Bilirubin 0.5 0.3 - 1.2 mg/dL   GFR, Estimated 37 (L) >60 mL/min    Comment: (NOTE) Calculated using the CKD-EPI Creatinine Equation (2021)    Anion gap 9 5 - 15    Comment: Performed at [facility], [place] [address], [place], [place] [address]  Hemoglobin A1c     Status: Abnormal   Collection Time: 11/24/21  6:25 PM  Result Value Ref Range   Hgb A1c MFr Bld 6.1 (H) 4.8 - 5.6 %    Comment: (NOTE) Pre diabetes:          5.7%-6.4%  Diabetes:              >6.4%  Glycemic control for   <7.0% adults with diabetes    Mean Plasma Glucose 128.37 mg/dL    Comment: Performed at [facility]  Lab, [address], [place], [place] [address]  Lipid panel     Status: Abnormal   Collection Time: 11/24/21  6:25 PM  Result Value Ref Range   Cholesterol 127 0 - 200 mg/dL   Triglycerides 161 (H) <150 mg/dL   HDL 39 (L) >40 mg/dL   Total CHOL/HDL Ratio 3.3 RATIO   VLDL 32 0 - 40 mg/dL   LDL Cholesterol 56 0 - 99 mg/dL    Comment:        Total Cholesterol/HDL:CHD Risk Coronary Heart Disease Risk Table                     Men   Women  1/2 Average Risk   3.4   3.3  Average Risk       5.0   4.4  2 X Average Risk   9.6   7.1  3 X Average Risk  23.4   11.0        Use the calculated Patient Ratio above and the CHD Risk Table to determine the patient's CHD Risk.        ATP III  CLASSIFICATION (LDL):  <100     mg/dL   Optimal  100-129  mg/dL   Near or Above                    Optimal  130-159  mg/dL   Borderline  160-189  mg/dL   High  >190     mg/dL   Very High Performed at [facility] [address] [place] [address], [place], [place] [address]   TSH     Status: Abnormal   Collection Time: 11/24/21  6:25 PM  Result Value Ref Range   TSH 6.847 (H) 0.350 - 4.500 uIU/mL    Comment: Performed by a 3rd Generation assay with a functional sensitivity of <=0.01 uIU/mL. Performed at [facility], [place] [address], [place], [place] [address]     Blood Alcohol level:  Lab Results  Component Value Date   [facility] <10 11/22/2021   ETH <10 26/83/4196    Metabolic Disorder Labs: Lab Results  Component Value Date   HGBA1C 6.1 (H) 11/24/2021   MPG 128.37 11/24/2021   MPG 119.76 03/28/2021   Lab Results  Component Value Date   PROLACTIN 11.0 03/14/2019   Lab Results  Component Value Date   CHOL 127 11/24/2021   TRIG 161 (H) 11/24/2021   HDL 39 (L) 11/24/2021   CHOLHDL 3.3 11/24/2021   VLDL 32 11/24/2021   LDLCALC 56 11/24/2021   LDLCALC 95 10/26/2020    Physical Findings: AIMS: Facial and Oral Movements Muscles of Facial Expression: None, normal Lips and Perioral Area: None, normal Jaw: None, normal Tongue: None, normal,Extremity Movements Upper (arms, wrists, hands, fingers): Mild Lower (legs, knees, ankles, toes): None, normal, Trunk Movements Neck, shoulders, hips: None, normal, Overall Severity Severity of abnormal movements (highest score from questions above): None, normal Incapacitation due to abnormal movements: None, normal Patient's awareness of abnormal movements (rate only patient's report): No Awareness, Dental Status Current problems with teeth and/or dentures?: No Does patient usually wear dentures?: No  CIWA:    COWS:     Musculoskeletal: Strength & Muscle Tone: within normal limits Gait & Station:  normal Patient leans: N/A  Psychiatric Specialty Exam:  Presentation  General Appearance: Appropriate for Environment; Casual  Eye Contact:Fleeting (constantly looking around the room)  Speech:Pressured; Garbled (rarely clear and coherent)  Speech Volume:Normal  Handedness:Right   Mood and Affect  Mood:Euthymic  Affect:Restricted   Thought Process  Thought Processes:Disorganized  Descriptions of Associations:Tangential  Orientation:Partial  Thought Content:Illogical; Tangential; Scattered  History of Schizophrenia/Schizoaffective disorder:Yes  Duration of Psychotic Symptoms:Greater than six months  Hallucinations:Hallucinations: -- (denies but is visibly responding to internal stimuli)  Ideas of Reference:None  Suicidal Thoughts:Suicidal Thoughts: No  Homicidal Thoughts:Homicidal Thoughts: No   Sensorium  Memory:Immediate Fair; Recent [place]  Insight:None   Executive Functions  Concentration:Fair  Attention Span:Fair  Recall:Poor  Fund of Knowledge:Poor  Language:Poor   Psychomotor Activity  Psychomotor Activity:Psychomotor Activity: Shuffling Gait   Assets  Assets:Housing; Social Support   Sleep  Sleep:Sleep: Fair Number of Hours of Sleep: 5    Physical Exam: Physical Exam Constitutional:      General: She is not in acute distress.    Appearance: Normal appearance. She is obese. She is not ill-appearing or toxic-appearing.  HENT:     Head: Normocephalic and atraumatic.  Pulmonary:     Effort: Pulmonary effort is normal.  Musculoskeletal:        General: Normal range of motion.  Neurological:     Mental Status: She is alert. Mental status is at baseline.   Review of Systems  Respiratory:  Negative for cough and shortness of breath.   Cardiovascular:  Negative for chest pain.  Gastrointestinal:  Negative for abdominal pain, constipation, diarrhea, nausea and vomiting.  Neurological:  Negative for weakness  and headaches.  Psychiatric/Behavioral:  Negative for depression, hallucinations (she reports none but is visibly responding to internal stimuli) and suicidal ideas. The patient is not nervous/anxious.   Blood pressure 130/80, pulse (!) 102, temperature 97.9 F (36.6 C), temperature source Oral, resp. rate 18, height 5' (1.524 m), weight 76.7 kg, SpO2 99 %. Body mass index is 33.01 kg/m.   Treatment Plan Summary: Daily contact with patient to assess and evaluate symptoms and progress in treatment and Medication management  Paula Dickson is a 55 yr old female who presents with bizarre behaviors and nonsensical speech after medication noncompliance.  PPHx is significant for Schizoaffective Disorder- Bipolar type complicated by medication noncompliance and previous suicide attempt (OD in 2018), multiple hospitalizations (latest 11/21 [facility]).   Patient continues to be disorganized and unable to speak more than 4-5 words before they become unintelligible.  She appears to be responding to internal stimuli as she looked around the room during the internal interview.  Spoke with Hospitalist Dr. Marylyn Ishihara about patient's increasing Creatinine he recommended trending it and if it continues to increase to send her to [facility] for IV fluid and a kidney US.  We will recheck a BMP this evening to trend her Creatinine.  We will increase her Geodon.  As her heart rate and BP continue to increase we will restart her home Propanolol but given her AKI we will not restart her Losartan at this time.  We will continue to monitor.    Schizoaffective Disorder- Bipolar type: -Increase Geodon to 60 mg BID -Continue Restoril 15 mg QHS -Consider additional antipsychotic medication and/or mood stabilizer given past need for multiple antipsychotics with consideration of noncompliance in follow ups   HTN   Tachycardia: -Vitals (12/30) : BP - 126/95  HR: 96 -Restart Propanolol 10 mg BID -Continue to hold Losartan 25 mg  daily   AKI: -Creatinine (12/29) - 1.60 -Encourage PO fluid intake -Recheck BMP tonight if further increase consider sending to [facility] for IV fluid and Kidney US   Hyperthyroidism: -Continue Methimazole  10 mg BID -TSH (12/29)- 6.847 -Follow up with PCP for further management   Briant Cedar, MD 11/25/2021, 2:12 PM

## 2021-11-25 NOTE — Group Note (Signed)
Date:  11/25/2021 Time:  11:36 AM  Group Topic/Focus:  Goals Group:   The focus of this group is to help patients establish daily goals to achieve during treatment and discuss how the patient can incorporate goal setting into their daily lives to aide in recovery.    Participation Level:  Active  Participation Quality:  Appropriate and Attentive  Affect:  Appropriate  Cognitive:  Disorganized  Insight: Good  Engagement in Group:  Engaged  Modes of Intervention:  Discussion  Additional Comments:  Pt says her goal is to have a speedy recovery and get back to her life.   [name] 11/25/2021, 11:36 AM

## 2021-11-25 NOTE — Group Note (Signed)
Recreation Therapy Group Note   Group Topic:Personal Development  Group Date: 11/25/2021 Start Time: 1005 End Time: 1040 Facilitators: [name], LRT,CTRS Location: [address] Dayroom   Goal Area(s) Addresses:  Patient will identify some accomplishments and low points of this past year. Patient will identify goals for the upcoming year.  Group Description: LRT and patients discussed this past year and looking forward to the upcoming year.  Patients were given a worksheet that broke down areas for patients to reflect on such as goals achieved, what has inspired them, lowest point, new skills learned, lessons learned, etc.  Patients then shared the areas they were comfortable with the rest of the group.   Affect/Mood: Full range   Participation Level: Hyperverbal   Participation Quality: Maximum Cues   Behavior: Hyperverbal and Off-task   Speech/Thought Process: Disorganized   Insight: Impaired   Judgement: Impaired   Modes of Intervention: Worksheet   Patient Response to Interventions:  Challenging    Education Outcome:  Acknowledges education and In group clarification offered    Clinical Observations/Individualized Feedback: Pt was unable to focus.  Pt was randomly laughing and making random thoughts that made no sense.    Plan: Continue to engage patient in RT group sessions 2-3x/week.   [name], Glennis Brink 11/25/2021 12:37 PM

## 2021-11-26 LAB — URINALYSIS, COMPLETE (UACMP) WITH MICROSCOPIC
Bacteria, UA: NONE SEEN
Bilirubin Urine: NEGATIVE
Glucose, UA: NEGATIVE mg/dL
Hgb urine dipstick: NEGATIVE
Ketones, ur: NEGATIVE mg/dL
Leukocytes,Ua: NEGATIVE
Nitrite: NEGATIVE
Protein, ur: NEGATIVE mg/dL
Specific Gravity, Urine: 1.005 (ref 1.005–1.030)
pH: 6 (ref 5.0–8.0)

## 2021-11-26 MED ORDER — WHITE PETROLATUM EX OINT
TOPICAL_OINTMENT | CUTANEOUS | Status: AC
  Filled 2021-11-26: qty 5

## 2021-11-26 MED ORDER — ZIPRASIDONE HCL 80 MG PO CAPS
80.0000 mg | ORAL_CAPSULE | Freq: Two times a day (BID) | ORAL | Status: DC
  Administered 2021-11-26 – 2021-12-01 (×9): 80 mg via ORAL
  Filled 2021-11-26 (×14): qty 1

## 2021-11-26 NOTE — Progress Notes (Signed)
Psychoeducational Group Note  Date:  11/26/2021 Time:  2015  Group Topic/Focus:  Wrap up group  Participation Level: Did Not Attend  Participation Quality:  Not Applicable  Affect:  Not Applicable  Cognitive:  Not Applicable  Insight:  Not Applicable  Engagement in Group: Not Applicable  Additional Comments:  Did not attend. Pt was notified that group was beginning but remained in room. Pt  came in dayroom after group was over.   Shellia Cleverly 11/26/2021, 8:41 PM

## 2021-11-26 NOTE — Group Note (Signed)
[facility] LCSW Group Therapy Note  Date:  11/26/2021   Type of Therapy and Topic:  Group Therapy:  Focus for the New Year  Participation Level:  Active   Description of Group:  The focus of this group was to provide patients with an opportunity to think about and discuss what they can work on this coming year that will result in them being happier and healthier one year from today.  It was reviewed how "new year's resolutions" often fade in importance within a few days, so patients were encouraged to think in a broader, more impactful way about what they wish to focus on to change their lives.  Therapeutic Goals Patients discussed in general the benefit of having goals to work on Patients described their own personal goals/focus for the next year that will enable them to be happier and healthier Patients received encouragement from each other and CSW Patients provided support and ideas to each other  Summary of Patient Progress: During group, patient expressed that her focus for the upcoming year is to work on health so that she's happier and healthier.    Therapeutic Modalities Processing   Selmer Dominion, LCSW (led group)  Lahoma Crocker, LCSWA (wrote notes)

## 2021-11-26 NOTE — Progress Notes (Signed)
Pt still awake. Pt redirected to go to her room and get some rest. Pt begins talking about random subjects. Pt not causing any behavior issues. Pt has taken all prescribed medications this evening.

## 2021-11-26 NOTE — Progress Notes (Signed)
Paula Dickson has been up and down most of the night.  She has been requesting multiple cups of ice water.  She is pleasant and not disrupting the milieu.  She has been making bizarre gestures and pacing around.  She appears to be in no physical distress.

## 2021-11-26 NOTE — Progress Notes (Signed)
Pt presents with disorganized thinking and incoherent speech at times.  Pt denies SI/HI but endorses AH.  Pt is intrusive and needs redirection often  throughout the day.  RN will continue to provide support and intervene as indicated.

## 2021-11-26 NOTE — Progress Notes (Signed)
[facility] MD Progress Note  11/26/2021 11:26 AM Paula Dickson  MRN:  [id number] Subjective:    Paula Dickson is a 55 yr old female who presents with bizarre behaviors and nonsensical speech after medication noncompliance.  PPHx is significant for Schizoaffective Disorder- Bipolar type complicated by medication noncompliance and previous suicide attempt (OD in 2018), multiple hospitalizations (latest 11/21 [facility]).   Case was discussed in the multidisciplinary team. MAR was reviewed and patient was compliant with medications.  She still required PRN Zyprexa, Atarax, and Trazodone yesterday.   Psychiatric Team made the following recommendations yesterday: -Increase Geodon to 60 mg BID -Continue Restoril 15 mg QHS -Restart Propanolol 10 mg BID -Consulted Hospitalist Service   During the interview today she continued to look around the room.  She would also repeatedly state "you know what I mean."  When asked how she slept last night she did not answer but put her hands up to her head in the sleeping position and smiled.  She then said that she likes to read at night and left a book on her nightstand and so if her sister goes to her house she will not recognize it but that other people have keys to the house.  She reports that her appetite is doing good.  She reports no SI or HI.  When asked about hallucinations she reports auditory hallucinations "I hear them be truthful."  When asked how she was doing today she said that she was doing fine but that she had some right knee pain due to ingrown toenails.  She reports she has an appointment with a podiatrist but has been unable to make the appointment because she cannot drive.  She states that what happened is not her fault.  She then states she needs to update her license and then states "I am short not tall like my sister" and that she does not want to donate her organs or be cremated because she is of the same religion as her father.  When asked if she  was having any side effects from her medications she states not yet and then says "people are taking money out of her account".  She reports no other concerns at present.   Principal Problem: Schizoaffective disorder, bipolar type ([place]) Diagnosis: Principal Problem:   Schizoaffective disorder, bipolar type ([place]) Active Problems:   Hyperthyroidism   Benign essential HTN   AKI (acute kidney injury) ([place])  Total Time spent with patient: 30 minutes  Past Psychiatric History: Schizoaffective Disorder- Bipolar type complicated by medication noncompliance and previous suicide attempt (OD in 2018), multiple hospitalizations (latest 11/21 [facility]  Past Medical History:  Past Medical History:  Diagnosis Date   Bipolar affective disorder ([place])    Bipolar disorder ([place])    History of arthritis    History of chicken pox    History of depression    History of genital warts    history of heart murmur    History of high blood pressure    History of thyroid disease    History of UTI    Hypertension    Low TSH level 07/13/2017   Schizophrenia ([place])     Past Surgical History:  Procedure Laterality Date   ABLATION ON ENDOMETRIOSIS     CYST REMOVAL NECK     around 11 years ago /benign   MULTIPLE TOOTH EXTRACTIONS     Family History:  Family History  Problem Relation Age of Onset   Arthritis Father    Hyperlipidemia Father  High blood pressure Father    Diabetes Sister    Diabetes Mother    Diabetes Brother    Mental illness Brother    Alcohol abuse Paternal Uncle    Alcohol abuse Paternal Grandfather    Breast cancer Maternal Aunt    Breast cancer Paternal Aunt    High blood pressure Sister    Mental illness Other        runs in family   Family Psychiatric  History: Paternal Grandfather - EtOH abuse Paternal Uncle - EtOH abuse Brother - Unspecified mental illness Multiple members - mental illness ("runs in the family") Social History:  Social History   Substance and Sexual  Activity  Alcohol Use Not Currently     Social History   Substance and Sexual Activity  Drug Use Not Currently    Social History   Socioeconomic History   Marital status: Single    Spouse name: Not on file   Number of children: Not on file   Years of education: Not on file   Highest education level: Patient refused  Occupational History   Not on file  Tobacco Use   Smoking status: Never   Smokeless tobacco: Never  Vaping Use   Vaping Use: Never used  Substance and Sexual Activity   Alcohol use: Not Currently   Drug use: Not Currently   Sexual activity: Not Currently  Other Topics Concern   Not on file  Social History Narrative   ** Merged History Encounter **       Social Determinants of Health   Financial Resource Strain: Not on file  Food Insecurity: Not on file  Transportation Needs: Not on file  Physical Activity: Not on file  Stress: Not on file  Social Connections: Not on file   Additional Social History:                         Sleep: Poor  Appetite:  Fair  Current Medications: Current Facility-Administered Medications  Medication Dose Route Frequency Provider Last Rate Last Admin   acetaminophen (TYLENOL) tablet 650 mg  650 mg Oral Q6H PRN Ethelene Hal, NP       alum & mag hydroxide-simeth (MAALOX/MYLANTA) 200-200-20 MG/5ML suspension 30 mL  30 mL Oral Q4H PRN Ethelene Hal, NP       hydrOXYzine (ATARAX) tablet 25 mg  25 mg Oral TID PRN Ethelene Hal, NP   25 mg at 11/26/21 0802   magnesium hydroxide (MILK OF MAGNESIA) suspension 30 mL  30 mL Oral Daily PRN Ethelene Hal, NP       methimazole (TAPAZOLE) tablet 10 mg  10 mg Oral BID Rosezetta Schlatter, MD   10 mg at 11/26/21 0801   OLANZapine zydis (ZYPREXA) disintegrating tablet 5 mg  5 mg Oral Q8H PRN Massengill, Ovid Curd, MD   5 mg at 11/25/21 1253   And   ziprasidone (GEODON) injection 20 mg  20 mg Intramuscular PRN Massengill, Ovid Curd, MD       pantoprazole  (PROTONIX) EC tablet 20 mg  20 mg Oral Daily Rosezetta Schlatter, MD   20 mg at 11/26/21 0801   propranolol (INDERAL) tablet 10 mg  10 mg Oral BID Massengill, Ovid Curd, MD   10 mg at 11/26/21 0802   temazepam (RESTORIL) capsule 15 mg  15 mg Oral QHS Rosezetta Schlatter, MD   15 mg at 11/25/21 2058   traZODone (DESYREL) tablet 50 mg  50 mg Oral QHS  PRN Ethelene Hal, NP   50 mg at 11/25/21 2058   white petrolatum (VASELINE) gel   Topical PRN Rosezetta Schlatter, MD       ziprasidone (GEODON) capsule 80 mg  80 mg Oral BID WC Briant Cedar, MD        Lab Results:  Results for orders placed or performed during the hospital encounter of 11/23/21 (from the past 48 hour(s))  Comprehensive metabolic panel     Status: Abnormal   Collection Time: 11/24/21  6:25 PM  Result Value Ref Range   Sodium 138 135 - 145 mmol/L   Potassium 3.7 3.5 - 5.1 mmol/L   Chloride 103 98 - 111 mmol/L   CO2 26 22 - 32 mmol/L   Glucose, Bld 122 (H) 70 - 99 mg/dL    Comment: Glucose reference range applies only to samples taken after fasting for at least 8 hours.   BUN 28 (H) 6 - 20 mg/dL   Creatinine, Ser 1.60 (H) 0.44 - 1.00 mg/dL   Calcium 9.6 8.9 - 10.3 mg/dL   Total Protein 7.5 6.5 - 8.1 g/dL   Albumin 4.6 3.5 - 5.0 g/dL   AST 21 15 - 41 U/L   ALT 24 0 - 44 U/L   Alkaline Phosphatase 69 38 - 126 U/L   Total Bilirubin 0.5 0.3 - 1.2 mg/dL   GFR, Estimated 37 (L) >60 mL/min    Comment: (NOTE) Calculated using the CKD-EPI Creatinine Equation (2021)    Anion gap 9 5 - 15    Comment: Performed at [facility], [place] [address], [place], [place] [address]  Hemoglobin A1c     Status: Abnormal   Collection Time: 11/24/21  6:25 PM  Result Value Ref Range   Hgb A1c MFr Bld 6.1 (H) 4.8 - 5.6 %    Comment: (NOTE) Pre diabetes:          5.7%-6.4%  Diabetes:              >6.4%  Glycemic control for   <7.0% adults with diabetes    Mean Plasma Glucose 128.37 mg/dL    Comment: Performed at [facility] [address] [place], [place] [address]  Lipid panel     Status: Abnormal   Collection Time: 11/24/21  6:25 PM  Result Value Ref Range   Cholesterol 127 0 - 200 mg/dL   Triglycerides 161 (H) <150 mg/dL   HDL 39 (L) >40 mg/dL   Total CHOL/HDL Ratio 3.3 RATIO   VLDL 32 0 - 40 mg/dL   LDL Cholesterol 56 0 - 99 mg/dL    Comment:        Total Cholesterol/HDL:CHD Risk Coronary Heart Disease Risk Table                     Men   Women  1/2 Average Risk   3.4   3.3  Average Risk       5.0   4.4  2 X Average Risk   9.6   7.1  3 X Average Risk  23.4   11.0        Use the calculated Patient Ratio above and the CHD Risk Table to determine the patient's CHD Risk.        ATP III CLASSIFICATION (LDL):  <100     mg/dL   Optimal  100-129  mg/dL   Near or Above  Optimal  130-159  mg/dL   Borderline  160-189  mg/dL   High  >190     mg/dL   Very High Performed at [facility] [address], [place], [place] [address]   TSH     Status: Abnormal   Collection Time: 11/24/21  6:25 PM  Result Value Ref Range   TSH 6.847 (H) 0.350 - 4.500 uIU/mL    Comment: Performed by a 3rd Generation assay with a functional sensitivity of <=0.01 uIU/mL. Performed at [facility], [place] [address], [place], [place] [address]   CK     Status: Abnormal   Collection Time: 11/25/21  7:01 PM  Result Value Ref Range   Total CK 488 (H) 38 - 234 U/L    Comment: Performed at [facility], [place] [address], [place], [place] [address]  Basic metabolic panel     Status: Abnormal   Collection Time: 11/25/21  7:01 PM  Result Value Ref Range   Sodium 139 135 - 145 mmol/L   Potassium 3.7 3.5 - 5.1 mmol/L   Chloride 105 98 - 111 mmol/L   CO2 26 22 - 32 mmol/L   Glucose, Bld 113 (H) 70 - 99 mg/dL    Comment: Glucose reference range applies only to samples taken after fasting for at least 8 hours.   BUN 24 (H) 6 - 20 mg/dL    Creatinine, Ser 1.48 (H) 0.44 - 1.00 mg/dL   Calcium 9.6 8.9 - 10.3 mg/dL   GFR, Estimated 41 (L) >60 mL/min    Comment: (NOTE) Calculated using the CKD-EPI Creatinine Equation (2021)    Anion gap 8 5 - 15    Comment: Performed at [facility], [place] [address], [place], [place] [address]  Urinalysis, Complete w Microscopic Urine, Clean Catch     Status: Abnormal   Collection Time: 11/25/21  8:36 PM  Result Value Ref Range   Color, Urine STRAW (A) YELLOW   APPearance CLEAR CLEAR   Specific Gravity, Urine 1.005 1.005 - 1.030   pH 6.0 5.0 - 8.0   Glucose, UA NEGATIVE NEGATIVE mg/dL   Hgb urine dipstick NEGATIVE NEGATIVE   Bilirubin Urine NEGATIVE NEGATIVE   Ketones, ur NEGATIVE NEGATIVE mg/dL   Protein, ur NEGATIVE NEGATIVE mg/dL   Nitrite NEGATIVE NEGATIVE   Leukocytes,Ua NEGATIVE NEGATIVE   RBC / HPF 0-5 0 - 5 RBC/hpf   Bacteria, UA NONE SEEN NONE SEEN    Comment: Performed at [facility], [place] [address] [place], [place] [address]    Blood Alcohol level:  Lab Results  Component Value Date   [facility] <10 11/22/2021   ETH <10 57/26/2035    Metabolic Disorder Labs: Lab Results  Component Value Date   HGBA1C 6.1 (H) 11/24/2021   MPG 128.37 11/24/2021   MPG 119.76 03/28/2021   Lab Results  Component Value Date   PROLACTIN 11.0 03/14/2019   Lab Results  Component Value Date   CHOL 127 11/24/2021   TRIG 161 (H) 11/24/2021   HDL 39 (L) 11/24/2021   CHOLHDL 3.3 11/24/2021   VLDL 32 11/24/2021   LDLCALC 56 11/24/2021   LDLCALC 95 10/26/2020    Physical Findings: AIMS: Facial and Oral Movements Muscles of Facial Expression: None, normal Lips and Perioral Area: None, normal Jaw: None, normal Tongue: None, normal,Extremity Movements Upper (arms, wrists, hands, fingers): Mild Lower (legs, knees, ankles, toes): None, normal, Trunk Movements Neck, shoulders, hips: None, normal, Overall  Severity Severity of abnormal movements (highest  score from questions above): None, normal Incapacitation due to abnormal movements: None, normal Patient's awareness of abnormal movements (rate only patient's report): No Awareness, Dental Status Current problems with teeth and/or dentures?: No Does patient usually wear dentures?: No  CIWA:    COWS:     Musculoskeletal: Strength & Muscle Tone: within normal limits Gait & Station: normal Patient leans: N/A  Psychiatric Specialty Exam:  Presentation  General Appearance: Appropriate for Environment; Casual  Eye Contact:Poor (still looking around room but less than yesterday)  Speech:Pressured; Clear and Coherent  Speech Volume:Normal  Handedness:Right   Mood and Affect  Mood:Euthymic  Affect:Labile   Thought Process  Thought Processes:Disorganized  Descriptions of Associations:Tangential  Orientation:Partial  Thought Content:Scattered; Tangential  History of Schizophrenia/Schizoaffective disorder:Yes  Duration of Psychotic Symptoms:Greater than six months  Hallucinations:Hallucinations: Auditory Description of Auditory Hallucinations: "I hear them be truthful"  Ideas of Reference:Paranoia  Suicidal Thoughts:Suicidal Thoughts: No  Homicidal Thoughts:Homicidal Thoughts: No   Sensorium  Memory:Immediate Fair; Recent Fair  Judgment:Impaired  Insight:None   Executive Functions  Concentration:Fair  Attention Span:Fair  Recall:Poor  Fund of Knowledge:Poor  Language:Poor   Psychomotor Activity  Psychomotor Activity:Psychomotor Activity: Investment banker, operational   Assets  Assets:Social Support; Housing   Sleep  Sleep:Sleep: Poor Number of Hours of Sleep: 3.75    Physical Exam: Physical Exam Vitals and nursing note reviewed.  Constitutional:      General: She is not in acute distress.    Appearance: Normal appearance. She is obese. She is not ill-appearing or toxic-appearing.  HENT:     Head: Normocephalic and atraumatic.  Pulmonary:      Effort: Pulmonary effort is normal.  Musculoskeletal:        General: Normal range of motion.  Neurological:     General: No focal deficit present.     Mental Status: She is alert.   Review of Systems  Respiratory:  Negative for cough and shortness of breath.   Cardiovascular:  Negative for chest pain.  Gastrointestinal:  Negative for abdominal pain, constipation, diarrhea, nausea and vomiting.  Neurological:  Negative for weakness and headaches.  Psychiatric/Behavioral:  Negative for depression, hallucinations and suicidal ideas. The patient is not nervous/anxious.   Blood pressure 133/90, pulse (!) 101, temperature 98.2 F (36.8 C), temperature source Oral, resp. rate 18, height 5' (1.524 m), weight 76.7 kg, SpO2 94 %. Body mass index is 33.01 kg/m.   Treatment Plan Summary: Daily contact with patient to assess and evaluate symptoms and progress in treatment and Medication management  Henya is a 55 yr old female who presents with bizarre behaviors and nonsensical speech after medication noncompliance.  PPHx is significant for Schizoaffective Disorder- Bipolar type complicated by medication noncompliance and previous suicide attempt (OD in 2018), multiple hospitalizations (latest 11/21 [facility]).   Labs drawn last night show that her Creatinine has had a slight decrease to 1.48 (from 1.6).  Her CK has also come back at 488 which most likely means she has been falling while at home but unsure of at this time and she is not reliable to report.  She did provide a sample for UA.  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  At this time as her Creatinine has decreased and her sodium has increased from 138 to 139 we will not  fluid restrict at this time but may need to if her sodium begins to decrease.  We will have daily BMP to  monitor her Creatinine and Sodium.  Her blood pressure and heart rate have had some improvement since restarting her Propanolol, we will continue to monitor her vitals for further needed adjustments.  We will further increase her Geodon this evening.  As she has needed multiple antipsychotics in the past she will most likely need an additional agent but need to proceed slowly.  She would be better served on a Geripsych unit so we have reached out to [facility] for possible transfer.  We will continue to monitor.   Schizoaffective Disorder- Bipolar type: -Increase Geodon from 60 mg to 80 mg BID -Continue Restoril 15 mg QHS -Consider additional antipsychotic medication and/or mood stabilizer given past need for multiple antipsychotics with consideration of noncompliance in follow ups   HTN   Tachycardia: -Vitals (12/31) : BP - 111/99  HR: 97 -Continue Propanolol 10 mg BID -Continue to hold Losartan 25 mg daily   AKI: -Creatinine (12/30) - 1.48 -Daily BMP -Encourage PO fluid intake -Recheck BMP tonight if further increase consider sending to [facility] for IV fluid and Kidney US   Hyperthyroidism: -Continue Methimazole 10 mg BID -TSH (12/29)- 6.847 -Follow up with PCP for further management   -Continue Protonix 20 mg daily -Continue PRN's: Tylenol, Maalox, Atarax, Milk of Magnesia, Trazodone   Briant Cedar, MD 11/26/2021, 11:26 AM

## 2021-11-26 NOTE — Progress Notes (Signed)
Pt was encouraged but didn't attend orientation/goals group.

## 2021-11-26 NOTE — Progress Notes (Signed)
°   11/26/21 1950  Psych Admission Type (Psych Patients Only)  Admission Status Voluntary  Psychosocial Assessment  Patient Complaints None  Eye Contact Fair  Facial Expression Animated  Affect Preoccupied  Speech Soft;Tangential  Interaction Assertive;Needy  Motor Activity Slow  Appearance/Hygiene Unremarkable;In scrubs  Behavior Characteristics Cooperative;Anxious;Intrusive  Mood Anxious;Preoccupied  Thought Process  Coherency Disorganized;Tangential;Flight of ideas  Content Preoccupation;Religiosity  Delusions None reported or observed  Perception WDL  Hallucination None reported or observed  Judgment Poor  Confusion None  Danger to Self  Current suicidal ideation? Denies  Danger to Others  Danger to Others None reported or observed   Pt seen walking down the hall. Pt denies SI, HI, AVH. Rates pain 10/10 in her left toe as an ingrown toenail. Then pt says that her toenails need to be cut. Pt tangential and disorganized in thought. Believes her brother is trying to have her committed to a nursing home. Pt needy and boundaries established. Pt says she is anxious but refused to give a number on the scale (1-10). Pt denies depression. Pt takes meds as prescribed. Pt still awake and moving around her room doing little tasks.

## 2021-11-27 DIAGNOSIS — F250 Schizoaffective disorder, bipolar type: Principal | ICD-10-CM

## 2021-11-27 DIAGNOSIS — F209 Schizophrenia, unspecified: Secondary | ICD-10-CM | POA: Diagnosis present

## 2021-11-27 DIAGNOSIS — F068 Other specified mental disorders due to known physiological condition: Secondary | ICD-10-CM | POA: Diagnosis present

## 2021-11-27 LAB — BASIC METABOLIC PANEL
Anion gap: 6 (ref 5–15)
BUN: 24 mg/dL — ABNORMAL HIGH (ref 6–20)
CO2: 26 mmol/L (ref 22–32)
Calcium: 9.6 mg/dL (ref 8.9–10.3)
Chloride: 107 mmol/L (ref 98–111)
Creatinine, Ser: 1.29 mg/dL — ABNORMAL HIGH (ref 0.44–1.00)
GFR, Estimated: 48 mL/min — ABNORMAL LOW (ref >60)
Glucose, Bld: 126 mg/dL — ABNORMAL HIGH (ref 70–99)
Potassium: 3.6 mmol/L (ref 3.5–5.1)
Sodium: 139 mmol/L (ref 135–145)

## 2021-11-27 MED ORDER — MELATONIN 5 MG PO TABS
5.0000 mg | ORAL_TABLET | Freq: Every day | ORAL | Status: DC
  Administered 2021-11-27 – 2021-12-09 (×10): 5 mg via ORAL
  Filled 2021-11-27 (×16): qty 1

## 2021-11-27 MED ORDER — TEMAZEPAM 30 MG PO CAPS
30.0000 mg | ORAL_CAPSULE | Freq: Every day | ORAL | Status: DC
  Administered 2021-11-27 – 2021-12-09 (×10): 30 mg via ORAL
  Filled 2021-11-27 (×12): qty 1

## 2021-11-27 NOTE — Progress Notes (Signed)
Pt declined to take scheduled Geodon at 1700.

## 2021-11-27 NOTE — Progress Notes (Signed)
[facility] MD Progress Note  11/27/2021 8:20 AM Marabella Popiel Matus  MRN:  [id number] Subjective:    Brei is a 56 yr old female who presents with bizarre behaviors and nonsensical speech after medication noncompliance.  PPHx is significant for Schizoaffective Disorder- Bipolar type complicated by medication noncompliance and previous suicide attempt (OD in 2018), multiple hospitalizations (latest 11/21 [facility]).   Case was discussed in the multidisciplinary team. MAR was reviewed and patient was compliant with medications.  She again required PRN Zyprexa, Atarax, and Trazodone yesterday.   Psychiatric Team made the following recommendations yesterday: -Increase Geodon from 60 mg to 80 mg BID -Continue Restoril 15 mg QHS -Continue Propanolol 10 mg BID -Daily BMP to monitor Creatinine and Sodium    Patient initially did not speak during interview instead miming answers but did eventually answer questions with words.  She did continue to repeat the phrase "know what I mean" throughout interview.  Today the majority of the interview had a Religious underpinning.     When asked how she slept last night she initially smiled and mimed her hands at her head but then stated "I prepared, read my book, slept, prepared, read my book, and slept."  She reports her appetite is good but later told me that she did not eat all the eggs because she thinks she maybe allergic and doesn't want the staff to be angry.  When asked about SI/HI she states no emphatically and then states "in the past with my neighbor's...when there were 200 cars in one spot."  When asked about Visual Hallucinations she reports seeing Valarie Merino (she then laughed and looked up at the ceiling).  She reports auditory hallucinations but when asked what she reports "I hear good things so it's ok. I may be pregnant but no one tells me."  When asked if she is having any issues with her medications she says no then reports "god helps erase past mistakes,  need to know right voice in life."  She then states her brother is a Pharmacist, community and his daughter is a Firefighter and plays the flute.  She reports no other concerns at present.    Per nursing patient slept only approximately 30 minutes last night and required frequent redirecting.    Principal Problem: Schizoaffective disorder, bipolar type ([place]) Diagnosis: Principal Problem:   Schizoaffective disorder, bipolar type ([place]) Active Problems:   Hyperthyroidism   Benign essential HTN   AKI (acute kidney injury) ([place])  Total Time spent with patient: 30 minutes  Past Psychiatric History: Schizoaffective Disorder- Bipolar type complicated by medication noncompliance and previous suicide attempt (OD in 2018), multiple hospitalizations (latest 11/21 [facility]).  Past Medical History:  Past Medical History:  Diagnosis Date   Bipolar affective disorder ([place])    Bipolar disorder ([place])    History of arthritis    History of chicken pox    History of depression    History of genital warts    history of heart murmur    History of high blood pressure    History of thyroid disease    History of UTI    Hypertension    Low TSH level 07/13/2017   Schizophrenia ([place])     Past Surgical History:  Procedure Laterality Date   ABLATION ON ENDOMETRIOSIS     CYST REMOVAL NECK     around 11 years ago /benign   MULTIPLE TOOTH EXTRACTIONS     Family History:  Family History  Problem Relation Age of Onset  Arthritis Father    Hyperlipidemia Father    High blood pressure Father    Diabetes Sister    Diabetes Mother    Diabetes Brother    Mental illness Brother    Alcohol abuse Paternal Uncle    Alcohol abuse Paternal Grandfather    Breast cancer Maternal Aunt    Breast cancer Paternal Aunt    High blood pressure Sister    Mental illness Other        runs in family   Family Psychiatric  History: Paternal Grandfather - EtOH abuse Paternal Uncle - EtOH abuse Brother - Unspecified mental  illness Multiple members - mental illness ("runs in the family") Social History:  Social History   Substance and Sexual Activity  Alcohol Use Not Currently     Social History   Substance and Sexual Activity  Drug Use Not Currently    Social History   Socioeconomic History   Marital status: Single    Spouse name: Not on file   Number of children: Not on file   Years of education: Not on file   Highest education level: Patient refused  Occupational History   Not on file  Tobacco Use   Smoking status: Never   Smokeless tobacco: Never  Vaping Use   Vaping Use: Never used  Substance and Sexual Activity   Alcohol use: Not Currently   Drug use: Not Currently   Sexual activity: Not Currently  Other Topics Concern   Not on file  Social History Narrative   ** Merged History Encounter **       Social Determinants of Health   Financial Resource Strain: Not on file  Food Insecurity: Not on file  Transportation Needs: Not on file  Physical Activity: Not on file  Stress: Not on file  Social Connections: Not on file   Additional Social History:                         Sleep: Poor  Appetite:  Fair  Current Medications: Current Facility-Administered Medications  Medication Dose Route Frequency Provider Last Rate Last Admin   acetaminophen (TYLENOL) tablet 650 mg  650 mg Oral Q6H PRN Ethelene Hal, NP       alum & mag hydroxide-simeth (MAALOX/MYLANTA) 200-200-20 MG/5ML suspension 30 mL  30 mL Oral Q4H PRN Ethelene Hal, NP       hydrOXYzine (ATARAX) tablet 25 mg  25 mg Oral TID PRN Ethelene Hal, NP   25 mg at 11/26/21 2100   magnesium hydroxide (MILK OF MAGNESIA) suspension 30 mL  30 mL Oral Daily PRN Ethelene Hal, NP       methimazole (TAPAZOLE) tablet 10 mg  10 mg Oral BID Rosezetta Schlatter, MD   10 mg at 11/26/21 1638   OLANZapine zydis (ZYPREXA) disintegrating tablet 5 mg  5 mg Oral Q8H PRN Massengill, Ovid Curd, MD   5 mg at  11/25/21 1253   And   ziprasidone (GEODON) injection 20 mg  20 mg Intramuscular PRN Massengill, Ovid Curd, MD       pantoprazole (PROTONIX) EC tablet 20 mg  20 mg Oral Daily Rosezetta Schlatter, MD   20 mg at 11/26/21 0801   propranolol (INDERAL) tablet 10 mg  10 mg Oral BID Massengill, Ovid Curd, MD   10 mg at 11/26/21 1638   temazepam (RESTORIL) capsule 15 mg  15 mg Oral QHS Rosezetta Schlatter, MD   15 mg at 11/26/21 2100  traZODone (DESYREL) tablet 50 mg  50 mg Oral QHS PRN Ethelene Hal, NP   50 mg at 11/26/21 2100   white petrolatum (VASELINE) gel   Topical PRN Rosezetta Schlatter, MD   1 application at 89/21/19 2338   ziprasidone (GEODON) capsule 80 mg  80 mg Oral BID WC Briant Cedar, MD   80 mg at 11/26/21 1638    Lab Results:  Results for orders placed or performed during the hospital encounter of 11/23/21 (from the past 48 hour(s))  CK     Status: Abnormal   Collection Time: 11/25/21  7:01 PM  Result Value Ref Range   Total CK 488 (H) 38 - 234 U/L    Comment: Performed at [facility], [place] [address] [place], [place] [address]  Basic metabolic panel     Status: Abnormal   Collection Time: 11/25/21  7:01 PM  Result Value Ref Range   Sodium 139 135 - 145 mmol/L   Potassium 3.7 3.5 - 5.1 mmol/L   Chloride 105 98 - 111 mmol/L   CO2 26 22 - 32 mmol/L   Glucose, Bld 113 (H) 70 - 99 mg/dL    Comment: Glucose reference range applies only to samples taken after fasting for at least 8 hours.   BUN 24 (H) 6 - 20 mg/dL   Creatinine, Ser 1.48 (H) 0.44 - 1.00 mg/dL   Calcium 9.6 8.9 - 10.3 mg/dL   GFR, Estimated 41 (L) >60 mL/min    Comment: (NOTE) Calculated using the CKD-EPI Creatinine Equation (2021)    Anion gap 8 5 - 15    Comment: Performed at [facility], [place] [address] [place], [place] [address]  Urinalysis, Complete w Microscopic Urine, Clean Catch     Status: Abnormal   Collection Time: 11/25/21  8:36 PM  Result Value Ref Range    Color, Urine STRAW (A) YELLOW   APPearance CLEAR CLEAR   Specific Gravity, Urine 1.005 1.005 - 1.030   pH 6.0 5.0 - 8.0   Glucose, UA NEGATIVE NEGATIVE mg/dL   Hgb urine dipstick NEGATIVE NEGATIVE   Bilirubin Urine NEGATIVE NEGATIVE   Ketones, ur NEGATIVE NEGATIVE mg/dL   Protein, ur NEGATIVE NEGATIVE mg/dL   Nitrite NEGATIVE NEGATIVE   Leukocytes,Ua NEGATIVE NEGATIVE   RBC / HPF 0-5 0 - 5 RBC/hpf   Bacteria, UA NONE SEEN NONE SEEN    Comment: Performed at [facility], [place] [address], [place], [place] [address]  Basic metabolic panel     Status: Abnormal   Collection Time: 11/27/21  7:02 AM  Result Value Ref Range   Sodium 139 135 - 145 mmol/L   Potassium 3.6 3.5 - 5.1 mmol/L   Chloride 107 98 - 111 mmol/L   CO2 26 22 - 32 mmol/L   Glucose, Bld 126 (H) 70 - 99 mg/dL    Comment: Glucose reference range applies only to samples taken after fasting for at least 8 hours.   BUN 24 (H) 6 - 20 mg/dL   Creatinine, Ser 1.29 (H) 0.44 - 1.00 mg/dL   Calcium 9.6 8.9 - 10.3 mg/dL   GFR, Estimated 48 (L) >60 mL/min    Comment: (NOTE) Calculated using the CKD-EPI Creatinine Equation (2021)    Anion gap 6 5 - 15    Comment: Performed at [facility], [place] [address] [place], [place] [address]    Blood Alcohol level:  Lab Results  Component Value Date  ETH <10 11/22/2021   ETH <10 99/83/3825    Metabolic Disorder Labs: Lab Results  Component Value Date   HGBA1C 6.1 (H) 11/24/2021   MPG 128.37 11/24/2021   MPG 119.76 03/28/2021   Lab Results  Component Value Date   PROLACTIN 11.0 03/14/2019   Lab Results  Component Value Date   CHOL 127 11/24/2021   TRIG 161 (H) 11/24/2021   HDL 39 (L) 11/24/2021   CHOLHDL 3.3 11/24/2021   VLDL 32 11/24/2021   LDLCALC 56 11/24/2021   LDLCALC 95 10/26/2020    Physical Findings: AIMS: Facial and Oral Movements Muscles of Facial Expression: None, normal Lips and Perioral Area: None, normal Jaw:  None, normal Tongue: None, normal,Extremity Movements Upper (arms, wrists, hands, fingers): None, normal Lower (legs, knees, ankles, toes): None, normal, Trunk Movements Neck, shoulders, hips: None, normal, Overall Severity Severity of abnormal movements (highest score from questions above): None, normal Incapacitation due to abnormal movements: None, normal Patient's awareness of abnormal movements (rate only patient's report): No Awareness, Dental Status Current problems with teeth and/or dentures?: No Does patient usually wear dentures?: No  CIWA:    COWS:     Musculoskeletal: Strength & Muscle Tone: within normal limits Gait & Station: shuffle Patient leans: N/A  Psychiatric Specialty Exam:  Presentation  General Appearance: Appropriate for Environment; Casual  Eye Contact:Fair (frequently looking around the room)  Speech:Pressured (mostly clear and coherent but occasionally slurred)  Speech Volume:Normal  Handedness:Right   Mood and Affect  Mood:-- (gave a thumbs up)  Affect:Labile   Thought Process  Thought Processes:Disorganized; Irrevelant  Descriptions of Associations:Tangential  Orientation:Partial  Thought Content:Scattered; Tangential  History of Schizophrenia/Schizoaffective disorder:Yes  Duration of Psychotic Symptoms:Greater than six months  Hallucinations:Hallucinations: Auditory; Visual Description of Auditory Hallucinations: I hear things for the good so its ok Description of Visual Hallucinations: reports seeing Valarie Merino and then laughs  Ideas of Reference:Paranoia  Suicidal Thoughts:Suicidal Thoughts: No  Homicidal Thoughts:Homicidal Thoughts: No   Sensorium  Memory:Immediate Fair; Recent [place]  Insight:None   Executive Functions  Concentration:Fair  Attention Span:Fair  Recall:Poor  [employer] of Knowledge:Poor  Language:Poor   Psychomotor Activity  Psychomotor Activity:Psychomotor Activity: Geophysical data processor   Assets  Assets:Social Support; Housing   Sleep  Sleep:Sleep: Poor Number of Hours of Sleep: 0.5    Physical Exam: Physical Exam Vitals and nursing note reviewed.  Constitutional:      General: She is not in acute distress.    Appearance: Normal appearance. She is obese. She is not ill-appearing or toxic-appearing.  HENT:     Head: Normocephalic and atraumatic.  Pulmonary:     Effort: Pulmonary effort is normal.  Musculoskeletal:        General: Normal range of motion.  Neurological:     Mental Status: She is alert. Mental status is at baseline.   ROS Blood pressure (!) 134/94, pulse 97, temperature 98.2 F (36.8 C), temperature source Oral, resp. rate 18, height 5' (1.524 m), weight 76.7 kg, SpO2 99 %. Body mass index is 33.01 kg/m.   Treatment Plan Summary: Daily contact with patient to assess and evaluate symptoms and progress in treatment and Medication management  Naveya is a 56 yr old female who presents with bizarre behaviors and nonsensical speech after medication noncompliance.  PPHx is significant for Schizoaffective Disorder- Bipolar type complicated by medication noncompliance and previous suicide attempt (OD in 2018), multiple hospitalizations (latest 11/21 [facility]).   UA (12/31) was WNL except for being straw colored. Creatinine  trend: 1.26 to 1.60 to 1.48 to 1.29   Janique is still pressured in her speech and disorganized in her thought process requiring multiple redirections from staff throughout the day and has now started to express religious thoughts.  Her Creatinine is trending downward as today it is 1.29 and her Sodium is holding at 139.  We will continue with daily BMP's at this time.  We increased her Geodon yesterday.  She still needs further medication but given her age and kidney status will hold off on further changes today and readdress starting a second antipsychotic until tomorrow to ensure her kidneys continue to improve.  Reached out to  [facility] yesterday for Geri bed availability and they were at capacity, given her state she would benefit from a Geri bed so will continue to reach out.  We will continue to monitor.   Schizoaffective Disorder- Bipolar type: -Continue Geodon 80 mg BID -Continue Restoril 15 mg QHS -Consider additional antipsychotic medication and/or mood stabilizer given past need for multiple antipsychotics with consideration of noncompliance in follow ups   HTN   Tachycardia: -Vitals (1/1) : BP - 134/94  HR: 97 -Continue Propanolol 10 mg BID -Continue to hold Losartan 25 mg daily   AKI: -Creatinine (1/1) improving - 1.29 -Daily BMP -Encourage PO fluid intake   Hyperthyroidism: -Continue Methimazole 10 mg BID -TSH (12/29)- 6.847 -Follow up with PCP for further management   -Continue Protonix 20 mg daily -Continue PRN's: Tylenol, Maalox, Atarax, Milk of Magnesia, Trazodone   Briant Cedar, MD 11/27/2021, 8:20 AM

## 2021-11-27 NOTE — Progress Notes (Signed)
Pt back at nurse's station asking for towels and washcloths. Pt redirected to return to room and that those items would be provided when time for pts to get up at 0600.

## 2021-11-27 NOTE — Progress Notes (Signed)
Pt presents with disorganized speech and labile mood.  Pt denies SI/HI/AVH but appears to be responding to internal stimuli at times.  Pt requires constant redirection and behaviors appear to intensify in the late afternoon.  Pt is currently declining vital signs taken and to take medication.  RN will try again after dinner.

## 2021-11-27 NOTE — Progress Notes (Signed)
Adult Psychoeducational Group Note  Date:  11/27/2021 Time:  9:19 PM  Group Topic/Focus:  Wrap-Up Group:   The focus of this group is to help patients review their daily goal of treatment and discuss progress on daily workbooks.  Participation Level:  Active  Participation Quality:  Appropriate  Affect:  Anxious, Appropriate, and Excited  Cognitive:  Disorganized, Confused, and Delusional  Insight: Limited  Engagement in Group:  Engaged  Modes of Intervention:  Discussion  Additional Comments:  Pt stated her goal for today was to focus on her treatment plan. Pt stated she accomplished her goal today. Pt stated she did not get a chance to talked with her doctor or her social worker about her care today. Pt rated her overall day an 8 out of 10. Pt stated she was able to contact her brother today which improved her overall day. Pt stated she felt better about herself tonight. Pt stated she was able to attend  all meals today . Pt stated she took all medications provided today. Pt stated her appetite was good today. Pt rated her sleep last night was pretty good. Pt stated the goal tonight was to get some rest. Pt stated she had some physical pain tonight. Pt stated she had some moderate pain in both her left foot tonight. Pt rated the moderate pain in her left foot a 5 on the pain level scale. Pt nurse was updated on the situation. Pt admitted to dealing with visual hallucinations and auditory issues tonight. Pt nurse was updated on the situation. Pt denies thoughts of harming herself or others. Pt stated she would alert staff if anything changed.  [name] 11/27/2021, 9:19 PM

## 2021-11-27 NOTE — Group Note (Signed)
°  Group note already done, entered by accident

## 2021-11-27 NOTE — Group Note (Signed)
[facility] LCSW Group Therapy Note  Date/Time:  11/27/2021 11:15am-12:00pm  Type of Therapy and Topic:  Group Therapy:  Healthy and Unhealthy Supports  Participation Level:  Active   Description of Group:  Patients in this group were introduced to the idea of adding a variety of healthy supports to address the various needs in their lives, especially in reference to their plans and focus for the new year.  Patients discussed what additional healthy supports could be helpful in their recovery and wellness after discharge in order to prevent future hospitalizations.   An emphasis was placed on using counselor, doctor, therapy groups, 12-step groups, and problem-specific support groups to expand supports.    Therapeutic Goals:   1)  discuss importance of adding supports to stay well once out of the hospital  2)  compare healthy versus unhealthy supports and identify some examples of each  3)  generate ideas and descriptions of healthy supports that can be added  4)  offer mutual support about how to address unhealthy supports  5)  encourage active participation in and adherence to discharge plan    Summary of Patient Progress:  The patient stated that current healthy supports in her life are "staying alive."  The patient expressed a willingness to add drinks for her kidneys in order to avoid dialysis as support(s) to help in her recovery journey.  She had ongoing interjections throughout group that were distracting but not disruptive.   Therapeutic Modalities:   Motivational Interviewing Brief Solution-Focused Therapy  Selmer Dominion, LCSW

## 2021-11-28 DIAGNOSIS — F250 Schizoaffective disorder, bipolar type: Secondary | ICD-10-CM | POA: Diagnosis not present

## 2021-11-28 LAB — BASIC METABOLIC PANEL
Anion gap: 8 (ref 5–15)
BUN: 19 mg/dL (ref 6–20)
CO2: 26 mmol/L (ref 22–32)
Calcium: 9.9 mg/dL (ref 8.9–10.3)
Chloride: 105 mmol/L (ref 98–111)
Creatinine, Ser: 1.18 mg/dL — ABNORMAL HIGH (ref 0.44–1.00)
GFR, Estimated: 54 mL/min — ABNORMAL LOW (ref >60)
Glucose, Bld: 108 mg/dL — ABNORMAL HIGH (ref 70–99)
Potassium: 3.8 mmol/L (ref 3.5–5.1)
Sodium: 139 mmol/L (ref 135–145)

## 2021-11-28 MED ORDER — PROPRANOLOL HCL 10 MG PO TABS
10.0000 mg | ORAL_TABLET | Freq: Three times a day (TID) | ORAL | Status: DC
  Administered 2021-11-28 – 2021-11-29 (×2): 10 mg via ORAL
  Filled 2021-11-28 (×5): qty 1

## 2021-11-28 MED ORDER — OXCARBAZEPINE 150 MG PO TABS
150.0000 mg | ORAL_TABLET | Freq: Two times a day (BID) | ORAL | Status: DC
  Administered 2021-11-28 – 2021-11-30 (×4): 150 mg via ORAL
  Filled 2021-11-28 (×6): qty 1

## 2021-11-28 NOTE — Group Note (Signed)
LCSW Group Therapy Note   Type of Therapy and Topic:  Group Therapy:  Setting Goals  Participation Level:  Did Not Attend  Description of Group: In this process group, patients discussed using strengths to work toward goals and address challenges.  Patients identified two positive things about themselves and one goal they were working on.  Patients were given the opportunity to share openly and support each others plan for self-empowerment.  The group discussed the value of gratitude and were encouraged to have a daily reflection of positive characteristics or circumstances.  Patients were encouraged to identify a plan to utilize their strengths to work on current challenges and goals.  Therapeutic Goals Patient will verbalize personal strengths/positive qualities and relate how these can assist with achieving desired personal goals Patients will verbalize affirmation of peers plans for personal change and goal setting Patients will explore the value of gratitude and positive focus as related to successful achievement of goals Patients will verbalize a plan for regular reinforcement of personal positive qualities and circumstances.  Summary of Patient Progress: Did not attend    Therapeutic Modalities Cognitive Behavioral Therapy Motivational Interviewing

## 2021-11-28 NOTE — Group Note (Signed)
Recreation Therapy Group Note   Group Topic:Coping Skills  Group Date: 11/28/2021 Start Time: 6720 End Time: 1038 Facilitators: [name], LRT,CTRS Location: 500 Hall Dayroom   Goal Area(s) Addresses: Patient will define what a coping skill is. Patient will create a list of healthy coping skills beginning with each letter of the alphabet. Patient will successfully identify positive coping skills they can use post d/c.  Patient will acknowledge benefit(s) of using learned coping skills post d/c.   Group Description: Coping A to Z. Patient asked to identify what a coping skill is and when they use them.  Next patients were given a blank worksheet titled "Coping Skills A-Z". Patients were instructed to come up with at least one positive coping skill per letter of the alphabet, addressing a specific challenge (ex: stress, anger, anxiety, depression, grief, doubt, isolation, self-harm/suicidal thoughts, substance use). Patients were given 15 minutes to brainstorm, before ideas were presented to the large group. Patients and LRT debriefed on the importance of coping skill selection based on situation and back-up plans when a skill tried is not effective. At the end of group, patients were given an handout of alphabetized strategies to keep for future reference.   Affect/Mood: Anxious and Manic   Participation Level: Minimal   Participation Quality: Moderate Cues   Behavior: Bizarre   Speech/Thought Process: Disorganized   Insight: Lacking   Judgement: Lacking    Modes of Intervention: Worksheet   Patient Response to Interventions:  Challenging    Education Outcome:  Acknowledges education and In group clarification offered    Clinical Observations/Individualized Feedback: Pt was unable to focus on activity.  Pt had some uncontrollable laughing.  A lot of the things pt stated didn't make and sense.  Pt struggled to focus and complete the activity.  Pt had the challenge of  identifying coping skills for lack of finances.  Pt came up with coping skills of saving and accountant.     Plan: Continue to engage patient in RT group sessions 2-3x/week.   [name], LRT,CTRS 11/28/2021 11:52 AM

## 2021-11-28 NOTE — Plan of Care (Signed)
Progress note  Pt found in bed; compliant with medication administration. Pt was animated and pleasant. Pt presented with pressured, tangential, slurred speech. Pt is difficult to understand at times because of the excitement behind speaking. Pt has been seen in the dayroom and pacing. Pt provided self care with showering today. Pt denies si/hi/ah/vh and verbally agrees to approach staff if these become apparent or before harming themselves/others while at [place].  A: Pt provided support and encouragement. Pt given medication per protocol and standing orders. Q68m safety checks implemented and continued.  R: Pt safe on the unit. Will continue to monitor.  Problem: Education: Goal: Knowledge of [facility] Education information/materials will improve Outcome: Progressing

## 2021-11-28 NOTE — BHH Group Notes (Signed)
Orientation group and Psychoeducation group Patients were given self inventory sheets, and asked to discuss how they were feeling and one goal they would like to work on for today. Patients were then asked to read poem '' I walk down the street '' by Cristopher Peru as it contributes to negative behavioral patterns - and recognizing negative choices that we make as it impacts our mental health. Pt attended, and when she asked to share she began speaking in tongues, in word salad and could not answer more than simple question. She is observed to be disorganized, labile, and bizarre. During group she stated '' don't drink that water [place]. Oh loordly, you don't know what i'm saying, but I know what i'm talkinga bout ''

## 2021-11-28 NOTE — BHH Group Notes (Signed)
1400 Group Note- Patients were read Psychoeducational poem by Donzetta Kohut in which the brain is compared to two parts- the chimp who over reacts and disrupts out of emotional deregulation and the owl who analyzes things to help the situation. The patients were then asked to identify a time in their life in which they were both and share with the group.  Pt did not attend .

## 2021-11-28 NOTE — Progress Notes (Signed)
[facility] MD Progress Note  11/28/2021 2:52 PM Paula Dickson  MRN:  [id number] Subjective:  Paula Dickson is a 56 yr old female who presents with bizarre behaviors and nonsensical speech after medication noncompliance.  PPHx is significant for Schizoaffective Disorder- Bipolar type complicated by medication noncompliance and previous suicide attempt (OD in 2018), multiple hospitalizations (latest 11/21 [facility]).     Case was discussed in the multidisciplinary team. MAR was reviewed and patient was compliant with medications.  She again required PRN Zyprexa, Atarax, and Trazodone yesterday.     Psychiatric Team made the following recommendations yesterday: -Continue Geodon 80 mg BID -Continue Restoril 15 mg QHS -Consider additional antipsychotic medication and/or mood stabilizer given past need for multiple antipsychotics with consideration of noncompliance in follow ups -Continue Propanolol 10 mg BID -Continue to hold Losartan 25 mg daily -Continue Methimazole 10 mg BID  Patient presents resting in her bed. Patient reports that she is contemplating things in her bed, and wishes to relax on top of the covers. Patient is very tangential in her speech and has difficulty answering questions attempting to assess for paranoia, but overall patient endorses feeling safe on the unit. Patient denies SI, HI, and VH. 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 Patient denies any adverse side effects to medications.   Principal Problem: Schizoaffective disorder, bipolar type ([place]) Diagnosis: Principal Problem:   Schizoaffective disorder, bipolar type ([place]) Active Problems:   Hyperthyroidism   Benign essential HTN   AKI (acute kidney injury) ([place])   Cognitive dysfunction in chronic schizophrenia ([place])  Total Time spent with patient: 15 minutes  Past Psychiatric History: See H&P  Past Medical  History:  Past Medical History:  Diagnosis Date   Bipolar affective disorder ([place])    Bipolar disorder ([place])    History of arthritis    History of chicken pox    History of depression    History of genital warts    history of heart murmur    History of high blood pressure    History of thyroid disease    History of UTI    Hypertension    Low TSH level 07/13/2017   Schizophrenia ([place])     Past Surgical History:  Procedure Laterality Date   ABLATION ON ENDOMETRIOSIS     CYST REMOVAL NECK     around 11 years ago /benign   MULTIPLE TOOTH EXTRACTIONS     Family History:  Family History  Problem Relation Age of Onset   Arthritis Father    Hyperlipidemia Father    High blood pressure Father    Diabetes Sister    Diabetes Mother    Diabetes Brother    Mental illness Brother    Alcohol abuse Paternal Uncle    Alcohol abuse Paternal Grandfather    Breast cancer Maternal Aunt    Breast cancer Paternal Aunt    High blood pressure Sister    Mental illness Other        runs in family   Family Psychiatric  History: See H&P Social History:  Social History   Substance and Sexual Activity  Alcohol Use Not Currently     Social History   Substance and Sexual Activity  Drug Use Not Currently    Social History   Socioeconomic History   Marital status: Single    Spouse name: Not on file   Number  of children: Not on file   Years of education: Not on file   Highest education level: Patient refused  Occupational History   Not on file  Tobacco Use   Smoking status: Never   Smokeless tobacco: Never  Vaping Use   Vaping Use: Never used  Substance and Sexual Activity   Alcohol use: Not Currently   Drug use: Not Currently   Sexual activity: Not Currently  Other Topics Concern   Not on file  Social History Narrative   ** Merged History Encounter **       Social Determinants of Health   Financial Resource Strain: Not on file  Food Insecurity: Not on file   Transportation Needs: Not on file  Physical Activity: Not on file  Stress: Not on file  Social Connections: Not on file   Additional Social History:                         Sleep: Good  Appetite:  Good  Current Medications: Current Facility-Administered Medications  Medication Dose Route Frequency Provider Last Rate Last Admin   acetaminophen (TYLENOL) tablet 650 mg  650 mg Oral Q6H PRN Ethelene Hal, NP   650 mg at 11/27/21 2347   alum & mag hydroxide-simeth (MAALOX/MYLANTA) 200-200-20 MG/5ML suspension 30 mL  30 mL Oral Q4H PRN Ethelene Hal, NP       magnesium hydroxide (MILK OF MAGNESIA) suspension 30 mL  30 mL Oral Daily PRN Ethelene Hal, NP       melatonin tablet 5 mg  5 mg Oral QHS Hill, Jackie Plum, MD   5 mg at 11/27/21 2118   methimazole (TAPAZOLE) tablet 10 mg  10 mg Oral BID Rosezetta Schlatter, MD   10 mg at 11/28/21 0742   OLANZapine zydis (ZYPREXA) disintegrating tablet 5 mg  5 mg Oral Q8H PRN Massengill, Ovid Curd, MD   5 mg at 11/25/21 1253   And   ziprasidone (GEODON) injection 20 mg  20 mg Intramuscular PRN Massengill, Ovid Curd, MD       OXcarbazepine (TRILEPTAL) tablet 150 mg  150 mg Oral BID Freida Busman, MD       pantoprazole (PROTONIX) EC tablet 20 mg  20 mg Oral Daily Rosezetta Schlatter, MD   20 mg at 11/28/21 4132   propranolol (INDERAL) tablet 10 mg  10 mg Oral TID Janine Limbo, MD       temazepam (RESTORIL) capsule 30 mg  30 mg Oral QHS Hill, Jackie Plum, MD   30 mg at 11/27/21 2118   white petrolatum (VASELINE) gel   Topical PRN Rosezetta Schlatter, MD   1 application at 44/01/02 2338   ziprasidone (GEODON) capsule 80 mg  80 mg Oral BID WC Briant Cedar, MD   80 mg at 11/28/21 7253    Lab Results:  Results for orders placed or performed during the hospital encounter of 11/23/21 (from the past 48 hour(s))  Basic metabolic panel     Status: Abnormal   Collection Time: 11/27/21  7:02 AM  Result Value Ref Range    Sodium 139 135 - 145 mmol/L   Potassium 3.6 3.5 - 5.1 mmol/L   Chloride 107 98 - 111 mmol/L   CO2 26 22 - 32 mmol/L   Glucose, Bld 126 (H) 70 - 99 mg/dL    Comment: Glucose reference range applies only to samples taken after fasting for at least 8 hours.   BUN 24 (H) 6 -  20 mg/dL   Creatinine, Ser 1.29 (H) 0.44 - 1.00 mg/dL   Calcium 9.6 8.9 - 10.3 mg/dL   GFR, Estimated 48 (L) >60 mL/min    Comment: (NOTE) Calculated using the CKD-EPI Creatinine Equation (2021)    Anion gap 6 5 - 15    Comment: Performed at [facility], [place] [address], [place], [place] [address]  Basic metabolic panel     Status: Abnormal   Collection Time: 11/28/21  6:43 AM  Result Value Ref Range   Sodium 139 135 - 145 mmol/L   Potassium 3.8 3.5 - 5.1 mmol/L   Chloride 105 98 - 111 mmol/L   CO2 26 22 - 32 mmol/L   Glucose, Bld 108 (H) 70 - 99 mg/dL    Comment: Glucose reference range applies only to samples taken after fasting for at least 8 hours.   BUN 19 6 - 20 mg/dL   Creatinine, Ser 1.18 (H) 0.44 - 1.00 mg/dL   Calcium 9.9 8.9 - 10.3 mg/dL   GFR, Estimated 54 (L) >60 mL/min    Comment: (NOTE) Calculated using the CKD-EPI Creatinine Equation (2021)    Anion gap 8 5 - 15    Comment: Performed at [facility], [place] [address] [place], [place] [address]    Blood Alcohol level:  Lab Results  Component Value Date   [facility] <10 11/22/2021   ETH <10 41/74/0814    Metabolic Disorder Labs: Lab Results  Component Value Date   HGBA1C 6.1 (H) 11/24/2021   MPG 128.37 11/24/2021   MPG 119.76 03/28/2021   Lab Results  Component Value Date   PROLACTIN 11.0 03/14/2019   Lab Results  Component Value Date   CHOL 127 11/24/2021   TRIG 161 (H) 11/24/2021   HDL 39 (L) 11/24/2021   CHOLHDL 3.3 11/24/2021   VLDL 32 11/24/2021   LDLCALC 56 11/24/2021   LDLCALC 95 10/26/2020    Physical Findings: AIMS: Facial and Oral Movements Muscles of Facial Expression: None,  normal Lips and Perioral Area: None, normal Jaw: None, normal Tongue: None, normal,Extremity Movements Upper (arms, wrists, hands, fingers): None, normal Lower (legs, knees, ankles, toes): None, normal, Trunk Movements Neck, shoulders, hips: None, normal, Overall Severity Severity of abnormal movements (highest score from questions above): None, normal Incapacitation due to abnormal movements: None, normal Patient's awareness of abnormal movements (rate only patient's report): No Awareness, Dental Status Current problems with teeth and/or dentures?: No Does patient usually wear dentures?: No  CIWA:    COWS:     Musculoskeletal: Strength & Muscle Tone: within normal limits Gait & Station: normal Patient leans: N/A  Psychiatric Specialty Exam:  Presentation  General Appearance: Appropriate for Environment; Casual  Eye Contact:Fair  Speech:Clear and Coherent  Speech Volume:Normal  Handedness:Right   Mood and Affect  Mood:Euthymic  Affect:Congruent   Thought Process  Thought Processes:Disorganized  Descriptions of Associations:Circumstantial  Orientation:Full (Time, Place and Person)  Thought Content:Illogical; Delusions  History of Schizophrenia/Schizoaffective disorder:Yes  Duration of Psychotic Symptoms:Greater than six months  Hallucinations:Hallucinations: Auditory Description of Auditory Hallucinations: hearing voices"from heaven" Description of Visual Hallucinations: reports seeing Valarie Merino and then laughs  Ideas of Reference:Delusions  Suicidal Thoughts:Suicidal Thoughts: No  Homicidal Thoughts:Homicidal Thoughts: No   Sensorium  Memory:Recent Poor; Immediate Fair  Judgment:Impaired  Insight:None   Executive Functions  Concentration:Poor  Attention Span:Poor  Recall:Poor  Fund of Knowledge:Poor  Language:Fair   Psychomotor Activity  Psychomotor Activity:Psychomotor Activity: Normal   Assets  Assets:Communication Skills;  Resilience   Sleep  Sleep:Sleep: Good Number of Hours of Sleep: 0.5    Physical Exam: Physical Exam Constitutional:      Appearance: Normal appearance.  HENT:     Head: Normocephalic and atraumatic.  Eyes:     Extraocular Movements: Extraocular movements intact.  Pulmonary:     Effort: Pulmonary effort is normal.  Skin:    General: Skin is dry.  Neurological:     Mental Status: She is alert.   Review of Systems  Psychiatric/Behavioral:  Positive for hallucinations. Negative for suicidal ideas. The patient does not have insomnia.   Blood pressure (!) 145/82, pulse 89, temperature 97.6 F (36.4 C), temperature source Oral, resp. rate (!) 22, height 5' (1.524 m), weight 76.7 kg, SpO2 100 %. Body mass index is 33.01 kg/m.   Treatment Plan Summary: Daily contact with patient to assess and evaluate symptoms and progress in treatment and Medication management Paula Dickson appears more relaxed today and pacing less. Patient has still be observed singing to herself and possibly responding to stimuli. Patient appeared slightly less pressured but continues to be tangential.   Schizoaffective Disorder- Bipolar type: -Continue Geodon 80 mg BID -Continue Restoril 15 mg QHS -Start Trileptal 150mg  BID   HTN   Tachycardia: Patient may be attempting to compensate for elevated Cr.  -Vitals (1/2) : BP - 145/82  HR: 89 -Continue Propanolol 10 mg BID -Continue to hold Losartan 25 mg daily     AKI: -Creatinine improving - 1.29>1.18 -Daily BMP -Encourage PO fluid intake     Hyperthyroidism: -Continue Methimazole 10 mg BID -TSH (12/29)- 6.847 -Follow up with PCP for further management     -Continue Protonix 20 mg daily -Continue PRN's: Tylenol, Maalox, Atarax, Milk of Magnesia, Trazodone     PGY-2 Freida Busman, MD 11/28/2021, 2:52 PM

## 2021-11-28 NOTE — Progress Notes (Signed)
D: Pt alert and oriented. Pt denies experiencing any anxiety/depression at this time. Pt reports L toe pain 6/10 at this time. Pt denies experiencing any SI/HI, or AVH at this time, however, her speech remains disorganized with labile mood. Pt appears to be responding to internal stimuli at times as she is noted talking to self occasionally. Pt's speech is also difficult to understand.  A: Scheduled medications administered to pt, per MD orders. Tylenol 650 mg po prn given at 2347 for c/o L toe pain 6/10. Support and encouragement provided. Frequent verbal contact/redirection made. Routine safety checks conducted q15 minutes.  R: No adverse drug reactions noted. Pt verbally contracts for safety at this time. Pt complaint with medications. Pt interacts minimally with others on the unit. Pt remains safe at this time. Will continue to monitor.

## 2021-11-28 NOTE — Progress Notes (Signed)
Pt kept to herself much of the evening, pt presented with inappropriate laughing.     11/28/21 2100  Psych Admission Type (Psych Patients Only)  Admission Status Voluntary  Psychosocial Assessment  Patient Complaints Anxiety  Eye Contact Fair  Facial Expression Animated;Anxious;Pensive;Fixed smile  Affect Anxious;Preoccupied  Speech Incoherent;Pressured;Tangential  Interaction Assertive;Attention-seeking;Childlike  Motor Activity Slow  Appearance/Hygiene Unremarkable  Behavior Characteristics Restless  Mood Silly;Preoccupied;Anxious  Thought Process  Coherency Disorganized;Flight of ideas;Tangential  Content Preoccupation;Paranoia  Delusions Paranoid  Perception WDL  Hallucination None reported or observed  Judgment Poor  Confusion Mild  Danger to Self  Current suicidal ideation? Denies  Danger to Others  Danger to Others None reported or observed

## 2021-11-28 NOTE — Progress Notes (Signed)
Psychoeducational Group Note  Date:  11/28/2021 Time:  2106  Group Topic/Focus:  Wrap-Up Group:   The focus of this group is to help patients review their daily goal of treatment and discuss progress on daily workbooks.  Participation Level: Did Not Attend  Participation Quality:  Not Applicable  Affect:  Not Applicable  Cognitive:  Not Applicable  Insight:  Not Applicable  Engagement in Group: Not Applicable  Additional Comments:  The patient did not attend group this evening.   Archie Balboa S 11/28/2021, 9:06 PM

## 2021-11-29 DIAGNOSIS — F250 Schizoaffective disorder, bipolar type: Secondary | ICD-10-CM | POA: Diagnosis not present

## 2021-11-29 LAB — BASIC METABOLIC PANEL
Anion gap: 8 (ref 5–15)
BUN: 22 mg/dL — ABNORMAL HIGH (ref 6–20)
CO2: 27 mmol/L (ref 22–32)
Calcium: 10.1 mg/dL (ref 8.9–10.3)
Chloride: 108 mmol/L (ref 98–111)
Creatinine, Ser: 1.31 mg/dL — ABNORMAL HIGH (ref 0.44–1.00)
GFR, Estimated: 48 mL/min — ABNORMAL LOW (ref >60)
Glucose, Bld: 112 mg/dL — ABNORMAL HIGH (ref 70–99)
Potassium: 4 mmol/L (ref 3.5–5.1)
Sodium: 143 mmol/L (ref 135–145)

## 2021-11-29 LAB — CK: Total CK: 223 U/L (ref 38–234)

## 2021-11-29 LAB — TSH: TSH: 12.328 u[IU]/mL — ABNORMAL HIGH (ref 0.350–4.500)

## 2021-11-29 MED ORDER — RISPERIDONE 0.25 MG PO TABS
0.2500 mg | ORAL_TABLET | Freq: Two times a day (BID) | ORAL | Status: DC
  Filled 2021-11-29 (×4): qty 1

## 2021-11-29 MED ORDER — RISPERIDONE 0.25 MG PO TABS
0.2500 mg | ORAL_TABLET | Freq: Two times a day (BID) | ORAL | Status: DC
  Administered 2021-11-29 – 2021-11-30 (×2): 0.25 mg via ORAL
  Filled 2021-11-29 (×6): qty 1

## 2021-11-29 MED ORDER — AMANTADINE HCL 100 MG PO CAPS
100.0000 mg | ORAL_CAPSULE | Freq: Two times a day (BID) | ORAL | Status: DC
  Administered 2021-11-29 – 2021-12-07 (×16): 100 mg via ORAL
  Filled 2021-11-29 (×20): qty 1

## 2021-11-29 MED ORDER — RISPERIDONE 0.25 MG PO TABS
0.2500 mg | ORAL_TABLET | Freq: Once | ORAL | Status: AC
  Administered 2021-11-29: 0.25 mg via ORAL
  Filled 2021-11-29 (×2): qty 1

## 2021-11-29 MED ORDER — PROPRANOLOL HCL 20 MG PO TABS
20.0000 mg | ORAL_TABLET | Freq: Three times a day (TID) | ORAL | Status: DC
  Administered 2021-11-29 – 2021-12-05 (×18): 20 mg via ORAL
  Filled 2021-11-29 (×24): qty 1

## 2021-11-29 NOTE — Group Note (Signed)
Recreation Therapy Group Note   Group Topic:Problem Solving  Group Date: 11/29/2021 Start Time: 3167 End Time: 1040 Facilitators: [name], LRT,CTRS Location: [address] Dayroom   Goal Area(s) Addresses:  Patient will verbalize importance of using appropriate problem solving techniques.  Patient will identify positive change associated with effective problem solving skills.   Group Description:  Brain Teasers.  Patients were given three sheets of brain teasers.  Patients were to work through each problem to come up with the solution.  Patients were encouraged to take in all the clues when coming up with their answers.  Once patients completed the sheets, LRT and patients went through each one to see what each patient came up with.     Affect/Mood: N/A   Participation Level: Did not attend    Clinical Observations/Individualized Feedback:     Plan: Continue to engage patient in RT group sessions 2-3x/week.   [name], LRT,CTRS 11/29/2021 11:37 AM

## 2021-11-29 NOTE — Progress Notes (Signed)
Pt denies SI/HI/AVH and verbally agrees to approach staff if these become apparent or before harming themselves/others. Rates depression 0/10. Rates anxiety 0/10. Rates pain 0/10. Pt is preoccupied with in-grown toe nail. Pt has been heard talking to self and stated to the RN "what is that green thing over there? Looks like a hat." Pt has made random statements like "I do not want to miss the train" or "I do it naturally and let the sunshine on me," "Cheers! I got ice and Gatorade." Pt has been very tangential and having flight of ideas. Pt is constantly talking. Pt paces the hallways. Pt has to be redirected to take medications at different times.  Sometimes she will take them fine and other times she will not. Pt stated "I can't take Risperdal because it will make me rispy." Pt is redirectable most of the time. Pt is pleasant and silly. Scheduled medications administered to pt, per MD orders. RN provided support and encouragement to pt. Q15 min safety checks implemented and continued. Pt safe on the unit. RN will continue to monitor and intervene as needed.   11/29/21 0742  Psych Admission Type (Psych Patients Only)  Admission Status Voluntary  Psychosocial Assessment  Patient Complaints Other (Comment);Insomnia (pain in toe)  Eye Contact Fair  Facial Expression Animated;Pensive;Fixed smile  Affect Preoccupied;Anxious  Speech Incoherent;Pressured;Tangential  Interaction Attention-seeking;Assertive;Childlike;Needy  Motor Activity Slow  Appearance/Hygiene Unremarkable  Behavior Characteristics Restless;Cooperative;Impulsive  Mood Anxious;Silly;Preoccupied;Pleasant  Aggressive Behavior  Effect No apparent injury  Thought Process  Coherency Disorganized;Flight of ideas;Tangential  Content Preoccupation;Paranoia;Delusions  Delusions Paranoid  Perception Hallucinations  Hallucination Auditory;Visual  Judgment Poor  Confusion Moderate  Danger to Self  Current suicidal ideation? Denies  Danger  to Others  Danger to Others None reported or observed

## 2021-11-29 NOTE — [facility] Group Notes (Signed)
[facility] Group Notes:  (Nursing/MHT/Case Management/Adjunct)  Date:  11/29/2021  Time:  10:05 AM  Type of Therapy:   Orientation/Goals group  Participation Level:  Active  Participation Quality:  Intrusive  Affect:  Anxious and Not Congruent  Cognitive:  Disorganized and Confused  Insight:  Lacking  Engagement in Group:  Lacking and Limited  Modes of Intervention:  Discussion, Education, and Orientation  Summary of Progress/Problems: pt goal for today is to go home.   Paula Dickson J Christien Frankl 11/29/2021, 10:05 AM

## 2021-11-29 NOTE — Progress Notes (Signed)
°   11/29/21 0500  Sleep  Number of Hours 2.5

## 2021-11-29 NOTE — Progress Notes (Signed)
[facility] MD Progress Note  11/29/2021 4:51 PM Paula Dickson  MRN:  [id number] Subjective:  Paula Dickson is a 56 yr old female who presents with bizarre behaviors and nonsensical speech after medication noncompliance.  PPHx is significant for Schizoaffective Disorder- Bipolar type complicated by medication noncompliance and previous suicide attempt (OD in 2018), multiple hospitalizations (latest 11/21 [facility]).     Case was discussed in the multidisciplinary team. MAR was reviewed and patient was compliant with medications. She did not require PRN medications yesterday.     Psychiatric Team made the following recommendations yesterday: -Continue Geodon 80 mg BID -Continue Restoril 15 mg QHS -Start Trileptal 150mg  BID -Continue Propanolol 10 mg BID -Continue to hold Losartan 25 mg daily -Continue Methimazole 10 mg BID  On assessment this AM patient appears cheerful making random statements and obviously tangential. Patient denies SI, HI and VH. Patient reports that she hears "flutes" playing at times. Patient endorses that she is not drinking much fluids but will try to increase intake today.  Objectively patient goes off on tangents and obviously disorganized. Patient requires frequent redirection. Per RN patient only slept 2.5h last night.   Principal Problem: Schizoaffective disorder, bipolar type ([place]) Diagnosis: Principal Problem:   Schizoaffective disorder, bipolar type ([place]) Active Problems:   Hyperthyroidism   Benign essential HTN   AKI (acute kidney injury) ([place])   Cognitive dysfunction in chronic schizophrenia ([place])  Total Time spent with patient: 15 minutes  Past Psychiatric History: See H&P  Past Medical History:  Past Medical History:  Diagnosis Date   Bipolar affective disorder ([place])    Bipolar disorder ([place])    History of arthritis    History of chicken pox    History of depression    History of genital warts    history of heart murmur    History of high blood  pressure    History of thyroid disease    History of UTI    Hypertension    Low TSH level 07/13/2017   Schizophrenia ([place])     Past Surgical History:  Procedure Laterality Date   ABLATION ON ENDOMETRIOSIS     CYST REMOVAL NECK     around 11 years ago /benign   MULTIPLE TOOTH EXTRACTIONS     Family History:  Family History  Problem Relation Age of Onset   Arthritis Father    Hyperlipidemia Father    High blood pressure Father    Diabetes Sister    Diabetes Mother    Diabetes Brother    Mental illness Brother    Alcohol abuse Paternal Uncle    Alcohol abuse Paternal Grandfather    Breast cancer Maternal Aunt    Breast cancer Paternal Aunt    High blood pressure Sister    Mental illness Other        runs in family   Family Psychiatric  History: See H&P Social History:  Social History   Substance and Sexual Activity  Alcohol Use Not Currently     Social History   Substance and Sexual Activity  Drug Use Not Currently    Social History   Socioeconomic History   Marital status: Single    Spouse name: Not on file   Number of children: Not on file   Years of education: Not on file   Highest education level: Patient refused  Occupational History   Not on file  Tobacco Use   Smoking status: Never   Smokeless tobacco: Never  Vaping Use   Vaping  Use: Never used  Substance and Sexual Activity   Alcohol use: Not Currently   Drug use: Not Currently   Sexual activity: Not Currently  Other Topics Concern   Not on file  Social History Narrative   ** Merged History Encounter **       Social Determinants of Health   Financial Resource Strain: Not on file  Food Insecurity: Not on file  Transportation Needs: Not on file  Physical Activity: Not on file  Stress: Not on file  Social Connections: Not on file   Additional Social History:                         Sleep: Poor  Appetite:  Good  Current Medications: Current Facility-Administered  Medications  Medication Dose Route Frequency Provider Last Rate Last Admin   acetaminophen (TYLENOL) tablet 650 mg  650 mg Oral Q6H PRN Ethelene Hal, NP   650 mg at 11/27/21 2347   alum & mag hydroxide-simeth (MAALOX/MYLANTA) 200-200-20 MG/5ML suspension 30 mL  30 mL Oral Q4H PRN Ethelene Hal, NP       amantadine (SYMMETREL) capsule 100 mg  100 mg Oral BID Damita Dunnings B, MD   100 mg at 11/29/21 1638   magnesium hydroxide (MILK OF MAGNESIA) suspension 30 mL  30 mL Oral Daily PRN Ethelene Hal, NP       melatonin tablet 5 mg  5 mg Oral QHS Hill, Jackie Plum, MD   5 mg at 11/28/21 2045   OLANZapine zydis (ZYPREXA) disintegrating tablet 5 mg  5 mg Oral Q8H PRN Massengill, Ovid Curd, MD   5 mg at 11/25/21 1253   And   ziprasidone (GEODON) injection 20 mg  20 mg Intramuscular PRN Massengill, Ovid Curd, MD       OXcarbazepine (TRILEPTAL) tablet 150 mg  150 mg Oral BID Damita Dunnings B, MD   150 mg at 11/29/21 1638   pantoprazole (PROTONIX) EC tablet 20 mg  20 mg Oral Daily Rosezetta Schlatter, MD   20 mg at 11/29/21 5573   propranolol (INDERAL) tablet 20 mg  20 mg Oral TID Janine Limbo, MD   20 mg at 11/29/21 1638   risperiDONE (RISPERDAL) tablet 0.25 mg  0.25 mg Oral BID Massengill, Ovid Curd, MD       temazepam (RESTORIL) capsule 30 mg  30 mg Oral QHS Hill, Jackie Plum, MD   30 mg at 11/28/21 2045   white petrolatum (VASELINE) gel   Topical PRN Rosezetta Schlatter, MD   1 application at 22/02/54 2338   ziprasidone (GEODON) capsule 80 mg  80 mg Oral BID WC Briant Cedar, MD   80 mg at 11/29/21 1638    Lab Results:  Results for orders placed or performed during the hospital encounter of 11/23/21 (from the past 48 hour(s))  Basic metabolic panel     Status: Abnormal   Collection Time: 11/28/21  6:43 AM  Result Value Ref Range   Sodium 139 135 - 145 mmol/L   Potassium 3.8 3.5 - 5.1 mmol/L   Chloride 105 98 - 111 mmol/L   CO2 26 22 - 32 mmol/L   Glucose, Bld 108 (H)  70 - 99 mg/dL    Comment: Glucose reference range applies only to samples taken after fasting for at least 8 hours.   BUN 19 6 - 20 mg/dL   Creatinine, Ser 1.18 (H) 0.44 - 1.00 mg/dL   Calcium 9.9 8.9 - 10.3 mg/dL  GFR, Estimated 54 (L) >60 mL/min    Comment: (NOTE) Calculated using the CKD-EPI Creatinine Equation (2021)    Anion gap 8 5 - 15    Comment: Performed at [facility], [place] [address], [place], [place] [address]  Basic metabolic panel     Status: Abnormal   Collection Time: 11/29/21  6:13 AM  Result Value Ref Range   Sodium 143 135 - 145 mmol/L   Potassium 4.0 3.5 - 5.1 mmol/L   Chloride 108 98 - 111 mmol/L   CO2 27 22 - 32 mmol/L   Glucose, Bld 112 (H) 70 - 99 mg/dL    Comment: Glucose reference range applies only to samples taken after fasting for at least 8 hours.   BUN 22 (H) 6 - 20 mg/dL   Creatinine, Ser 1.31 (H) 0.44 - 1.00 mg/dL   Calcium 10.1 8.9 - 10.3 mg/dL   GFR, Estimated 48 (L) >60 mL/min    Comment: (NOTE) Calculated using the CKD-EPI Creatinine Equation (2021)    Anion gap 8 5 - 15    Comment: Performed at [facility], [place] [address], [place], [place] [address]  TSH     Status: Abnormal   Collection Time: 11/29/21  6:13 AM  Result Value Ref Range   TSH 12.328 (H) 0.350 - 4.500 uIU/mL    Comment: Performed by a 3rd Generation assay with a functional sensitivity of <=0.01 uIU/mL. Performed at [facility], [place] [address] [place], [place] [address]   CK     Status: None   Collection Time: 11/29/21  6:13 AM  Result Value Ref Range   Total CK 223 38 - 234 U/L    Comment: Performed at [facility], [place] [address] [place],  [address]    Blood Alcohol level:  Lab Results  Component Value Date   [facility] <10 11/22/2021   ETH <10 20/25/4270    Metabolic Disorder Labs: Lab Results  Component Value Date   HGBA1C 6.1 (H) 11/24/2021   MPG 128.37 11/24/2021   MPG 119.76  03/28/2021   Lab Results  Component Value Date   PROLACTIN 11.0 03/14/2019   Lab Results  Component Value Date   CHOL 127 11/24/2021   TRIG 161 (H) 11/24/2021   HDL 39 (L) 11/24/2021   CHOLHDL 3.3 11/24/2021   VLDL 32 11/24/2021   LDLCALC 56 11/24/2021   LDLCALC 95 10/26/2020    Physical Findings: AIMS: Facial and Oral Movements Muscles of Facial Expression: None, normal Lips and Perioral Area: None, normal Jaw: None, normal Tongue: None, normal,Extremity Movements Upper (arms, wrists, hands, fingers): None, normal Lower (legs, knees, ankles, toes): None, normal, Trunk Movements Neck, shoulders, hips: None, normal, Overall Severity Severity of abnormal movements (highest score from questions above): None, normal Incapacitation due to abnormal movements: None, normal Patient's awareness of abnormal movements (rate only patient's report): No Awareness, Dental Status Current problems with teeth and/or dentures?: No Does patient usually wear dentures?: No  CIWA:    COWS:    Psychiatric Specialty Exam:  Presentation  General Appearance: Casual; Bizarre  Eye Contact:Good  Speech:Clear and Coherent  Speech Volume:Normal  Handedness:Right   Mood and Affect  Mood:Euthymic  Affect:Congruent; Inappropriate   Thought Process  Thought Processes:Disorganized  Descriptions of Associations:Tangential  Orientation:Full (Time, Place and Person)  Thought Content:Delusions; Scattered  History of Schizophrenia/Schizoaffective disorder:No  Duration of Psychotic Symptoms:N/A  Hallucinations:Hallucinations: Auditory Description of Auditory Hallucinations: hears a flute  Ideas of  Reference:Delusions  Suicidal Thoughts:Suicidal Thoughts: No  Homicidal Thoughts:Homicidal Thoughts: No   Sensorium  Memory:Immediate Fair; Recent Poor  Judgment:Impaired  Insight:None   Executive Functions  Concentration:Poor  Attention Span:Poor  Recall:Poor  Fund of  Knowledge:Poor  Language:Fair   Psychomotor Activity  Psychomotor Activity:Psychomotor Activity: Normal   Assets  Assets:Resilience   Sleep  Sleep:Sleep: Good    Physical Exam: Physical Exam HENT:     Head: Normocephalic and atraumatic.  Eyes:     Extraocular Movements: Extraocular movements intact.  Musculoskeletal:        General: No swelling.     Comments: Some rigidity noted in Upper extremities on passive flexion  Neurological:     Mental Status: She is alert.   Review of Systems  Psychiatric/Behavioral:  Negative for depression and suicidal ideas.   Blood pressure (!) 157/84, pulse 88, temperature 97.7 F (36.5 C), temperature source Oral, resp. rate 18, height 5' (1.524 m), weight 76.7 kg, SpO2 100 %. Body mass index is 33.01 kg/m.   Treatment Plan Summary: Daily contact with patient to assess and evaluate symptoms and progress in treatment and Medication management Elizebath is noted to be more animated today and is seen talking to herself as she walks the halls and shouting out to anyone who passes her vicinity. Patient says pleasant things or nonsensical things when she shouts out. Patient is obviously very disorganized and delusional. Patient at this time requires another medication to target her dopamine receptors. Will attempt risperdal due to it's high affinity and will also start amantadine to help with EPS prophylaxis while attempting to preserve some cholinergic activity due to patient's possible underlying dementia.  Spoke with Hospitalist on consult for patient regarding elevated TSH and rise in Cr. Per their recommendations patient methimazole should be discontinued and repeat TSH can be evaluated in 5-7 days. Patient will likely need OP f/u. Regarding patient's Cr, pushing PO fluids is recommended and continue daily BMP. Per their recommendations should patient Cr worsen would recommend ED transfer for IV fluids. It is expected that patient's tachycardia  should improve with PO intake.They also recommend to continue to hold losartan and depression becomes concerning or present in patient, may want to considered propanolol transitioned to labetalol for tachycardia.  Schizoaffective Disorder- Bipolar type: -Continue Geodon 80 mg BID -Continue Restoril 15 mg QHS -Continue Trileptal 150mg  BID - Start Risperdal M-tabs 0.25 BID - Start Amantadine 100mg  BID, EPS prophylaxis   HTN   Tachycardia: Patient may be attempting to compensate for elevated Cr.  -Vitals (1/3) : BP - 157/84 HR: 88 -Continue Propanolol 10 mg BID -Continue to hold Losartan 25 mg daily     AKI: Reached out Hospitalist (please see note) Recommend pushing PO fluids. -Creatinine improving - 1.29>1.18 -Daily BMP -Encourage PO fluid intake: Gatorade and H2O     Hyperthyroidism: Spoke with hospitalist (1/3)  -Discontinue Methimazole 10 mg BID -TSH (12/29)- 6.847>>> 12.328 (1/3) -Follow up with PCP for further management     -Continue Protonix 20 mg daily -Continue PRN's: Tylenol, Maalox, Atarax, Milk of Magnesia, Trazodone  PGY-2 Freida Busman, MD 11/29/2021, 4:51 PM

## 2021-11-29 NOTE — BHH Suicide Risk Assessment (Signed)
[facility] INPATIENT:  Family/Significant Other Suicide Prevention Education  Suicide Prevention Education:  Education Completed; sister Paula Dickson [phone]), has been identified by the patient as the family member/significant other with whom the patient will be residing, and identified as the person(s) who will aid the patient in the event of a mental health crisis (suicidal ideations/suicide attempt).  With written consent from the patient, the family member/significant other has been provided the following suicide prevention education, prior to the and/or following the discharge of the patient.  CSW spoke with this patients sister who informed CSW that this patient has been refusing to talk to her or allow her in the house. She is unsure of everything that has been going on with this pt due to limited contact with her at this time. She does state that this patient has not been sleeping and will call people at all hours of the night waking them up.   The suicide prevention education provided includes the following: Suicide risk factors Suicide prevention and interventions National Suicide Hotline telephone number [facility] assessment telephone number [facility] Emergency Assistance Grant and/or Residential Mobile Crisis Unit telephone number  Request made of family/significant other to: Remove weapons (e.g., guns, rifles, knives), all items previously/currently identified as safety concern.   Remove drugs/medications (over-the-counter, prescriptions, illicit drugs), all items previously/currently identified as a safety concern.  The family member/significant other verbalizes understanding of the suicide prevention education information provided.  The family member/significant other agrees to remove the items of safety concern listed above.  Paula Dickson A Paula Dickson 11/29/2021, 1:10 PM

## 2021-11-29 NOTE — Progress Notes (Addendum)
TRH recommendations after discussion with primary team:  Hold methimazole for 3 days and check full thyroid panel in 3 days. If TSH normal or trending down, resume methimazole at 10 mg daily to try to achieve euthyroid state. Needs follow up with endocrinology as an outpatient. Her creatinine level /GFR are slightly worse today. Advised to encourage PO fluids and continue to monitor renal function daily. If it continues to worsen, may need IVF.   Tennis Must, MD

## 2021-11-29 NOTE — Progress Notes (Signed)
°   11/29/21 2100  Psych Admission Type (Psych Patients Only)  Admission Status Voluntary  Psychosocial Assessment  Patient Complaints Worrying;Suspiciousness  Eye Contact Fair  Facial Expression Animated;Pensive;Fixed smile  Affect Preoccupied;Anxious  Speech Incoherent;Pressured;Tangential  Interaction Attention-seeking;Assertive;Childlike;Needy  Motor Activity Slow  Appearance/Hygiene Unremarkable  Behavior Characteristics Cooperative;Fidgety  Mood Silly;Suspicious;Preoccupied;Pleasant  Aggressive Behavior  Effect No apparent injury  Thought Process  Coherency Disorganized;Flight of ideas;Tangential  Content Preoccupation;Paranoia;Delusions  Delusions Paranoid  Perception Hallucinations  Hallucination Auditory;Visual  Judgment Poor  Confusion Moderate  Danger to Self  Current suicidal ideation? Denies  Danger to Others  Danger to Others None reported or observed

## 2021-11-29 NOTE — Progress Notes (Signed)
Psychoeducational Group Note  Date:  11/29/2021 Time:  2155  Group Topic/Focus:  Wrap-Up Group:   The focus of this group is to help patients review their daily goal of treatment and discuss progress on daily workbooks.  Participation Level: Did Not Attend  Participation Quality:  Not Applicable  Affect:  Not Applicable  Cognitive:  Not Applicable  Insight:  Not Applicable  Engagement in Group: Not Applicable  Additional Comments:  The patient did not attend group this evening.   Archie Balboa S 11/29/2021, 9:55 PM

## 2021-11-29 NOTE — BHH Counselor (Signed)
CSW completed APS report for this pt.   Darletta Moll MSW, LCSW Clincal Social Worker  [facility]

## 2021-11-29 NOTE — BHH Counselor (Signed)
CSW left voicemail for DSS in order to complete an APS report on this patients behalf.     Darletta Moll MSW, LCSW Clincal Social Worker  [facility]

## 2021-11-29 NOTE — Hospital Course (Addendum)
DO NOT ERASE!!!! REMS: [id number] Paula Dickson is a 56 year old female with a longstanding psychiatric history of schizoaffective disorder- bipolar type complicated by medication noncompliance and previous suicide attempt via OD who was admitted voluntarily from [facility] for bizarre behaviors and nonsensical speech after medication noncompliance. While in the ED, she was given Geodon x1 on 12/27, Ativan x1 when refusing meds and unable to de-escalate on 12/28.  Psychiatric Treatment During her stay patient was started on Geodon and her home Zyprexa was discontinued. It appeared that patient had failed Zyprexa, based on her presentation. Collateral confirmed that patient had been going to her OP appts and was likely complaint with her Zyprexa. There was also concern for increased anticholinergic sensitivity on her cognition, thus throughout her hospitalization medications with less anticholinergic effects were optimized. Patient was also started on Restoril for insomnia. Patient continued to have disorganization, tangentiality, and some unstable mood with overall bizarre presentation and insomnia. Patient's Geodon wa titrated up as was her Remeron. Despite this patient continued to have poor sleep times. Patient also continued to require PRN Zyprexa for her behavior on the unit. Due to this patient was started on Risperdal. There was concern that patient required a 2nd antipsychotic with strong dopamine-2  (r) affinity, but would require less blood work for patient OP. Unfortunately patient appeared to require a more sedating antipsychotic as she was still having significant insomnia despite regimen. Patient was noted to do fairly well on questions assessing her general memory and cognition, thus it was decided that patient would be restarted on Zyprexa as her 2nd antipsychotic with Geodon. Patient was also started on Depakote to assist with the mood lability and sedation. Patient was started on amantadine (in an effort to  decrease anticholinergic medications) to assist with EPS prophylaxis. Patient was titrated up on Zyprexa. Patient refused the new Zyprexa and Depakote despite initially agreeing to the medication. Patient became very agitated, and her thought process changed from tangential to completely disorganized with word salad. Patient was consequently IVC'd and placed under a forced med order. Patient began receiving her medications and became more pleasant. Unfortunately patient's Depakote level resulted elevated and patient also appeared a bit more encephalopathic. Depakote was decreased and patient again returned to her tangential, disorganization.   Patient was interviewed by ACT during her hospitalization when patient was noted to begin improving and was accepted.  Unfortunately patient had a sudden downward trend where she began refusing medications and her forced med orders had to be continued 3 times total throughout her hospitalization.  It was ultimately decided that patient's Geodon was not sufficient.  Geodon was discontinued and patient was started on a combination of Haldol and Zyprexa.  Patient's Haldol was titrated up.  Patient required IM medications multiple times and missed several doses of some p.o. medications that were not available in IM form.  Initially patient was only receiving IM alternative Haldol however patient continued to have poor compliance and began receiving both IM Haldol and IM Zyprexa. Staff and providers focused on patient receiving antipsychotic medication and EPS prophylaxis.  Patient was not compliant with any of her medications for sleep or mood dysregulation.  Patient did begin to have a  tremor.  Patient was started on Cogentin and her amantadine was readjusted back to daily medication an effort to ensure the patient received anticholinergic medication in either PO or  IM form regardless of patient's compliance.  There was an attempt to start patient on Clozaril however, as  patient was  being noncompliant with p.o. medications it was decided that this was no longer an option.  Patient appeared to be preoccupied with the thought that her medications are not appropriate as it did not match the medication list she had prior to hospitalization.  There are significant redirection patient eventually became compliant with p.o. medications and labs are able to be drawn.  Patient was still overall disorganized and tangential however she was more goal-directed in her thought content when she was compliant with her medication.  Patient became more pleasant and compliant and her labs to be drawn.  At time of discharge patient's labs are noted to have significant improvement regarding her creatinine and Depakote level becoming therapeutic.  Patient consistently denied SI, HI and AVH for at least 48 hours prior to discharge.  Patient was able to interact well with her peers in the milieu.   During the time patient was not compliant with medication she began sleeping less.  Unfortunately even on nights when patient was compliant with her Restoril patient was still not sleeping.  Patient was started on Ambien and Restoril was discontinued.  Patient was noted to sleep better on this medication and by discharge was sleeping 7 hours at night.  1/13 - patient began refusing her PO QHS medications and forced meds had to be used 1/19- IM Zyprexa was added as patient began having more difficulty and started requiring IM meds BID 1/19- Discontinued Amantadine and started Cogentin 1mg  daily with IM back up if patient refused her PO    At time of discharge patient endorsed intent to remain compliant with her medications and was denying SI, HI and AVH for the last 48 hours.  At this time patient appears to have remains fairly stable over the past 48 hrs and there is unlikely to be a significant change. Patient will be discharged on the current regimen. Prior to discharge patient's ACT acted and endorsed  that he would see patient day of discharge and continue to see her daily.     Medical Treatment Initially patient's Methimazole was continued. During her hospitalization, patient's TSH was noted to increase from 6.847>12.328. Hospitalist was consulted who recommended that patient's Methimazole be held and her TSH be re evaluated at a later date. Patient 's TSH was checked 3 days later and had significantly fallen in a short time span, therefore her Methimazole was restarted but at a lower dose.  At discharge patient's TSH was 5.034.  Patient was recommended to f/u OP with her PCP.  Patient was also noted to have an AKI on admission. Hospitalist recommended that patient have increased PO intake and her Cr be monitored. Patient was able to increase her PO intake and throughout hospitalization as she improved, appeared to have some level of understanding that her kidney function was sub-optimal and was intentional about her PO intake. Patient's Cr fluctuated throughout her hospitalization, but patient never required transfer to ED for IV fluids and her Cr decreased and stabilized by discharge. At discharge it was 1.06. 2/2 to patient's AKI her losartan was held and not continued at discharge. In response to this discontinuation patient's home propanolol was increased to TID. Patient is recommended to f/u OP with her PCP.    SRA- Copy Paula Dickson is a 56 yr old female who presents with bizarre behaviors and nonsensical speech after medication noncompliance.  PPHx is significant for Schizoaffective Disorder- Bipolar type complicated by medication noncompliance and previous suicide attempt (OD in 2018), multiple hospitalizations (latest 11/21 [facility]).  During the patient's hospitalization, patient had extensive initial psychiatric evaluation, and follow-up psychiatric evaluations every day.   Psychiatric diagnoses provided upon initial assessment:  #Schizoaffective disorder- Bipolar type   Patient's psychiatric  medications were adjusted on admission:  -Discontinue Zyprexa 10 mg qHS  -Discontinue Lorazepam 1 mg TID - pt was not taking as prescribed (maybe one dose/day at bedtime). Monitor for w/d symptoms.  -Initiate Geodon 40 BID.  - Initiate Restoril 15 mg qHS for insomnia   During the hospitalization, other adjustments were made to the patient's psychiatric medication regimen:  -Geodon was increased to 80 mg bid with food -restoril was increased to 30 mg qhs -olanzapine was started and increased to 5 mg in the morning and 10 mg at bedtime -Zyprexa was started and the dose changed to ER 750 mg at bedtime (dose was 1,250 mg at bedtime, but VA was elevated and the dose was adjusted down to address this).  -methimazole dose was changed to 5 mg bid - for hyperthyroid  -propranolol was started and changed to ER 60 mg once daily  -amantadine was started, and the dose changed to 100 mg once daily, for EPS   Gradually, patient started adjusting to milieu.   Patient's care was discussed during the interdisciplinary team meeting every day during the hospitalization.   The patient denied having side effects to prescribed psychiatric medication.   The patient reports their target psychiatric symptoms of psychosis, disorganized thoughts, and paranoia, all responded to the psychiatric medications, and the patient reports overall benefit other psychiatric hospitalization. Staff reports that with psychiatric treatment, pt's mental status has returned to her previous baseline. Supportive psychotherapy was provided to the patient. The patient also participated in regular group therapy while admitted.    Labs were reviewed with the patient, and abnormal results were discussed with the patient.   The patient denied having suicidal thoughts more than 48 hours prior to discharge.  Patient denies having homicidal thoughts.  Patient denies having auditory hallucinations.  Patient denies any visual hallucinations.  Patient  denies having paranoid thoughts.   The patient is able to verbalize their individual safety plan to this provider.   It is recommended to the patient to continue psychiatric medications as prescribed, after discharge from the hospital.     It is recommended to the patient to follow up with your outpatient psychiatric provider and PCP.   Discussed with the patient, the impact of alcohol, drugs, tobacco have been there overall psychiatric and medical wellbeing, and total abstinence from substance use was recommended the patient.   1/26

## 2021-11-30 ENCOUNTER — Encounter (HOSPITAL_COMMUNITY): Payer: Self-pay

## 2021-11-30 DIAGNOSIS — F250 Schizoaffective disorder, bipolar type: Secondary | ICD-10-CM | POA: Diagnosis not present

## 2021-11-30 LAB — BASIC METABOLIC PANEL
Anion gap: 7 (ref 5–15)
BUN: 17 mg/dL (ref 6–20)
CO2: 26 mmol/L (ref 22–32)
Calcium: 9.7 mg/dL (ref 8.9–10.3)
Chloride: 105 mmol/L (ref 98–111)
Creatinine, Ser: 1.2 mg/dL — ABNORMAL HIGH (ref 0.44–1.00)
GFR, Estimated: 53 mL/min — ABNORMAL LOW (ref >60)
Glucose, Bld: 110 mg/dL — ABNORMAL HIGH (ref 70–99)
Potassium: 3.9 mmol/L (ref 3.5–5.1)
Sodium: 138 mmol/L (ref 135–145)

## 2021-11-30 MED ORDER — DIVALPROEX SODIUM ER 500 MG PO TB24
1250.0000 mg | ORAL_TABLET | Freq: Every day | ORAL | Status: DC
  Administered 2021-12-01 – 2021-12-04 (×4): 1250 mg via ORAL
  Filled 2021-11-30 (×6): qty 1

## 2021-11-30 MED ORDER — DIVALPROEX SODIUM ER 500 MG PO TB24
1500.0000 mg | ORAL_TABLET | Freq: Every day | ORAL | Status: DC
  Filled 2021-11-30: qty 3

## 2021-11-30 MED ORDER — OLANZAPINE 5 MG PO TABS
5.0000 mg | ORAL_TABLET | Freq: Every day | ORAL | Status: DC
  Filled 2021-11-30 (×3): qty 1

## 2021-11-30 NOTE — Progress Notes (Signed)
Progress note    11/30/21 0812  Psych Admission Type (Psych Patients Only)  Admission Status Voluntary  Psychosocial Assessment  Patient Complaints Anxiety;Confusion;Hyperactivity;Restlessness  Eye Contact Fair  Facial Expression Animated;Anxious;Pensive  Affect Anxious;Preoccupied  Speech Incoherent;Pressured;Tangential  Interaction Assertive;Attention-seeking  Motor Activity Slow  Appearance/Hygiene Unremarkable  Behavior Characteristics Cooperative;Appropriate to situation;Anxious  Mood Anxious;Preoccupied;Pleasant  Thought Process  Coherency Disorganized;Flight of ideas;Tangential  Content Preoccupation  Delusions None reported or observed  Perception Hallucinations  Hallucination Auditory;Visual  Judgment Poor  Confusion Moderate  Danger to Self  Current suicidal ideation? Denies  Danger to Others  Danger to Others None reported or observed

## 2021-11-30 NOTE — Progress Notes (Signed)
°   11/30/21 0500  Sleep  Number of Hours 2.75

## 2021-11-30 NOTE — Progress Notes (Addendum)
Pt refused dinner medications. Pt states they don't want anything experimental and that they will fix their mood naturally. Pt eventually took medications later after encouragement from other staff.

## 2021-11-30 NOTE — Progress Notes (Signed)
Pt continues to appear to be decompensating , pt starting to act the way she normally acts when she first gets to [facility].     11/30/21 2000  Psych Admission Type (Psych Patients Only)  Admission Status Voluntary  Psychosocial Assessment  Patient Complaints Suspiciousness;Worrying;Irritability;Agitation  Eye Contact Fair  Facial Expression Animated;Anxious;Pensive  Affect Anxious;Preoccupied  Speech Incoherent;Pressured;Tangential  Interaction Assertive;Attention-seeking  Motor Activity Slow  Appearance/Hygiene Unremarkable  Behavior Characteristics Agitated;Irritable;Restless  Mood Anxious;Preoccupied  Thought Process  Coherency Disorganized;Flight of ideas;Tangential  Content Preoccupation  Delusions None reported or observed  Perception Hallucinations  Hallucination Auditory;Visual  Judgment Poor  Confusion Moderate  Danger to Self  Current suicidal ideation? Denies  Danger to Others  Danger to Others None reported or observed

## 2021-11-30 NOTE — BH IP Treatment Plan (Signed)
Interdisciplinary Treatment and Diagnostic Plan Update  11/30/2021 Paula Dickson MRN: [id number]  Principal Diagnosis: Schizoaffective disorder, bipolar type [facility])  Secondary Diagnoses: Principal Problem:   Schizoaffective disorder, bipolar type ([place]) Active Problems:   Hyperthyroidism   Benign essential HTN   AKI (acute kidney injury) ([place])   Cognitive dysfunction in chronic schizophrenia ([place])   Current Medications:  Current Facility-Administered Medications  Medication Dose Route Frequency Provider Last Rate Last Admin   acetaminophen (TYLENOL) tablet 650 mg  650 mg Oral Q6H PRN Ethelene Hal, NP   650 mg at 11/27/21 2347   alum & mag hydroxide-simeth (MAALOX/MYLANTA) 200-200-20 MG/5ML suspension 30 mL  30 mL Oral Q4H PRN Ethelene Hal, NP       amantadine (SYMMETREL) capsule 100 mg  100 mg Oral BID Damita Dunnings B, MD   100 mg at 11/30/21 5809   magnesium hydroxide (MILK OF MAGNESIA) suspension 30 mL  30 mL Oral Daily PRN Ethelene Hal, NP       melatonin tablet 5 mg  5 mg Oral QHS Hill, Jackie Plum, MD   5 mg at 11/29/21 2058   OLANZapine zydis (ZYPREXA) disintegrating tablet 5 mg  5 mg Oral Q8H PRN Massengill, Ovid Curd, MD   5 mg at 11/25/21 1253   And   ziprasidone (GEODON) injection 20 mg  20 mg Intramuscular PRN Massengill, Ovid Curd, MD       OXcarbazepine (TRILEPTAL) tablet 150 mg  150 mg Oral BID Damita Dunnings B, MD   150 mg at 11/30/21 9833   pantoprazole (PROTONIX) EC tablet 20 mg  20 mg Oral Daily Rosezetta Schlatter, MD   20 mg at 11/30/21 8250   propranolol (INDERAL) tablet 20 mg  20 mg Oral TID Janine Limbo, MD   20 mg at 11/30/21 5397   risperiDONE (RISPERDAL) tablet 0.25 mg  0.25 mg Oral BID Massengill, Ovid Curd, MD   0.25 mg at 11/30/21 0813   temazepam (RESTORIL) capsule 30 mg  30 mg Oral QHS Hill, Jackie Plum, MD   30 mg at 11/29/21 2057   white petrolatum (VASELINE) gel   Topical PRN Rosezetta Schlatter, MD   1 application at  67/34/19 2338   ziprasidone (GEODON) capsule 80 mg  80 mg Oral BID WC Briant Cedar, MD   80 mg at 11/30/21 3790   PTA Medications: Medications Prior to Admission  Medication Sig Dispense Refill Last Dose   losartan (COZAAR) 25 MG tablet Take 1 tablet (25 mg total) by mouth daily. 30 tablet 0 Past Month   methimazole (TAPAZOLE) 10 MG tablet Take 1 tablet (10 mg total) by mouth 2 (two) times daily. 60 tablet 2 Past Month   OLANZapine (ZYPREXA) 10 MG tablet Take 10 mg by mouth at bedtime.   Past Month   pantoprazole (PROTONIX) 20 MG tablet Take 20 mg by mouth daily.   Past Month   propranolol (INDERAL) 20 MG tablet Take 1 tablet (20 mg total) by mouth 2 (two) times daily. (Patient taking differently: Take 10 mg by mouth 2 (two) times daily.) 60 tablet 0 Past Month   LORazepam (ATIVAN) 1 MG tablet Take 1 mg by mouth 3 (three) times daily.       Patient Stressors: Health problems   Medication change or noncompliance    Patient Strengths: Motivation for treatment/growth  Supportive family/friends   Treatment Modalities: Medication Management, Group therapy, Case management,  1 to 1 session with clinician, Psychoeducation, Recreational therapy.   Physician Treatment Plan for  Primary Diagnosis: Schizoaffective disorder, bipolar type ([place]) Long Term Goal(s): Improvement in symptoms so as ready for discharge   Short Term Goals: Ability to identify changes in lifestyle to reduce recurrence of condition will improve Ability to verbalize feelings will improve Ability to demonstrate self-control will improve Ability to identify and develop effective coping behaviors will improve Ability to maintain clinical measurements within normal limits will improve Compliance with prescribed medications will improve  Medication Management: Evaluate patient's response, side effects, and tolerance of medication regimen.  Therapeutic Interventions: 1 to 1 sessions, Unit Group sessions and Medication  administration.  Evaluation of Outcomes: Progressing  Physician Treatment Plan for Secondary Diagnosis: Principal Problem:   Schizoaffective disorder, bipolar type ([place]) Active Problems:   Hyperthyroidism   Benign essential HTN   AKI (acute kidney injury) ([place])   Cognitive dysfunction in chronic schizophrenia ([place])  Long Term Goal(s): Improvement in symptoms so as ready for discharge   Short Term Goals: Ability to identify changes in lifestyle to reduce recurrence of condition will improve Ability to verbalize feelings will improve Ability to demonstrate self-control will improve Ability to identify and develop effective coping behaviors will improve Ability to maintain clinical measurements within normal limits will improve Compliance with prescribed medications will improve     Medication Management: Evaluate patient's response, side effects, and tolerance of medication regimen.  Therapeutic Interventions: 1 to 1 sessions, Unit Group sessions and Medication administration.  Evaluation of Outcomes: Progressing   RN Treatment Plan for Primary Diagnosis: Schizoaffective disorder, bipolar type ([place]) Long Term Goal(s): Knowledge of disease and therapeutic regimen to maintain health will improve  Short Term Goals: Ability to verbalize feelings will improve, Ability to identify and develop effective coping behaviors will improve, and Compliance with prescribed medications will improve  Medication Management: RN will administer medications as ordered by provider, will assess and evaluate patient's response and provide education to patient for prescribed medication. RN will report any adverse and/or side effects to prescribing provider.  Therapeutic Interventions: 1 on 1 counseling sessions, Psychoeducation, Medication administration, Evaluate responses to treatment, Monitor vital signs and CBGs as ordered, Perform/monitor CIWA, COWS, AIMS and Fall Risk screenings as ordered, Perform  wound care treatments as ordered.  Evaluation of Outcomes: Progressing   LCSW Treatment Plan for Primary Diagnosis: Schizoaffective disorder, bipolar type ([place]) Long Term Goal(s): Safe transition to appropriate next level of care at discharge, Engage patient in therapeutic group addressing interpersonal concerns.  Short Term Goals: Engage patient in aftercare planning with referrals and resources, Increase ability to appropriately verbalize feelings, and Increase emotional regulation  Therapeutic Interventions: Assess for all discharge needs, 1 to 1 time with Social worker, Explore available resources and support systems, Assess for adequacy in community support network, Educate family and significant other(s) on suicide prevention, Complete Psychosocial Assessment, Interpersonal group therapy.  Evaluation of Outcomes: Progressing   Progress in Treatment: Attending groups: Yes. Participating in groups: Yes. Taking medication as prescribed: Yes. Toleration medication: Yes. Family/Significant other contact made: Yes, individual(s) contacted:  sister Patient understands diagnosis: No. Discussing patient identified problems/goals with staff: No. Medical problems stabilized or resolved: Yes. Denies suicidal/homicidal ideation: Yes. Issues/concerns per patient self-inventory: No. Other: None  New problem(s) identified: No, Describe:  None  New Short Term/Long Term Goal(s):medication stabilization, elimination of SI thoughts, development of comprehensive mental wellness plan.   Patient Goals: Declined to participate  Discharge Plan or Barriers: CSW made an ACTT referral for pt. An APS report has been made for pt.   Reason for  Continuation of Hospitalization: Delusions  Mania Medication stabilization  Estimated Length of Stay: 3-5 days   Scribe for Treatment Team: Eliott Nine 11/30/2021 10:22 AM

## 2021-11-30 NOTE — Progress Notes (Signed)
Pt refusing her PO medications

## 2021-11-30 NOTE — Progress Notes (Signed)
Pt has been sitting in the hallway being disruptive since coming in the shift. Pt has decompensated since coming to the hospital, pt posturing, labile , disturbing other patients

## 2021-11-30 NOTE — Progress Notes (Signed)
Psychoeducational Group Note  Date:  11/30/2021 Time:  2121  Group Topic/Focus:  Wrap-Up Group:   The focus of this group is to help patients review their daily goal of treatment and discuss progress on daily workbooks.  Participation Level: Did Not Attend  Participation Quality:  Not Applicable  Affect:  Not Applicable  Cognitive:  Not Applicable  Insight:  Not Applicable  Engagement in Group: Not Applicable  Additional Comments:  The patient did not attend group since she was too agitated.   Archie Balboa S 11/30/2021, 9:21 PM

## 2021-11-30 NOTE — Progress Notes (Signed)
[facility] MD Progress Note  11/30/2021 3:15 PM Paula Dickson  MRN:  [id number] Subjective:   Paula Dickson is a 56 yr old female who presents with bizarre behaviors and nonsensical speech after medication noncompliance.  PPHx is significant for Schizoaffective Disorder- Bipolar type complicated by medication noncompliance and previous suicide attempt (OD in 2018), multiple hospitalizations (latest 11/21 [facility]).     Case was discussed in the multidisciplinary team. MAR was reviewed and patient was compliant with medications. She did not require PRN medications yesterday.     Psychiatric Team made the following recommendations yesterday: -Continue Geodon 80 mg BID -Continue Restoril 30 mg QHS -Continue Trileptal 150mg  BID - Start Risperdal M-tabs 0.25 BID - Start Amantadine 100mg  BID, EPS prophylaxis -Continue Propanolol 20 mg BID -Continue to hold Losartan 25 mg daily  Objectively patient continues to wander the halls, has increased volume, makes bizarre statements, is tangential, and disorganized. Patient is not really able to hold a conversation, but can answer some questions before she becomes tangential.   Subjectively patient reports that she is feeling "queasy" and tired. Patient endorses that she has been more preoccupied with organizing > sleeping. Patient endorses that she is also a bit paranoid about some things on the unit. Patient denies SI, HI, and AVH.  Principal Problem: Schizoaffective disorder, bipolar type ([place]) Diagnosis: Principal Problem:   Schizoaffective disorder, bipolar type ([place]) Active Problems:   Hyperthyroidism   Benign essential HTN   AKI (acute kidney injury) ([place])   Cognitive dysfunction in chronic schizophrenia ([place])  Total Time spent with patient: 15 minutes  Past Psychiatric History: See H&P  Past Medical History:  Past Medical History:  Diagnosis Date   Bipolar affective disorder ([place])    Bipolar disorder ([place])    History of arthritis    History of  chicken pox    History of depression    History of genital warts    history of heart murmur    History of high blood pressure    History of thyroid disease    History of UTI    Hypertension    Low TSH level 07/13/2017   Schizophrenia ([place])     Past Surgical History:  Procedure Laterality Date   ABLATION ON ENDOMETRIOSIS     CYST REMOVAL NECK     around 11 years ago /benign   MULTIPLE TOOTH EXTRACTIONS     Family History:  Family History  Problem Relation Age of Onset   Arthritis Father    Hyperlipidemia Father    High blood pressure Father    Diabetes Sister    Diabetes Mother    Diabetes Brother    Mental illness Brother    Alcohol abuse Paternal Uncle    Alcohol abuse Paternal Grandfather    Breast cancer Maternal Aunt    Breast cancer Paternal Aunt    High blood pressure Sister    Mental illness Other        runs in family   Family Psychiatric  History: See H&P Social History:  Social History   Substance and Sexual Activity  Alcohol Use Not Currently     Social History   Substance and Sexual Activity  Drug Use Not Currently    Social History   Socioeconomic History   Marital status: Single    Spouse name: Not on file   Number of children: Not on file   Years of education: Not on file   Highest education level: Patient refused  Occupational History  Not on file  Tobacco Use   Smoking status: Never   Smokeless tobacco: Never  Vaping Use   Vaping Use: Never used  Substance and Sexual Activity   Alcohol use: Not Currently   Drug use: Not Currently   Sexual activity: Not Currently  Other Topics Concern   Not on file  Social History Narrative   ** Merged History Encounter **       Social Determinants of Health   Financial Resource Strain: Not on file  Food Insecurity: Not on file  Transportation Needs: Not on file  Physical Activity: Not on file  Stress: Not on file  Social Connections: Not on file   Additional Social History:                          Sleep: Poor  Appetite:  Fair  Current Medications: Current Facility-Administered Medications  Medication Dose Route Frequency Provider Last Rate Last Admin   acetaminophen (TYLENOL) tablet 650 mg  650 mg Oral Q6H PRN Ethelene Hal, NP   650 mg at 11/27/21 2347   alum & mag hydroxide-simeth (MAALOX/MYLANTA) 200-200-20 MG/5ML suspension 30 mL  30 mL Oral Q4H PRN Ethelene Hal, NP       amantadine (SYMMETREL) capsule 100 mg  100 mg Oral BID Damita Dunnings B, MD   100 mg at 11/30/21 4650   divalproex (DEPAKOTE ER) 24 hr tablet 1,250 mg  1,250 mg Oral Q2000 Damita Dunnings B, MD       magnesium hydroxide (MILK OF MAGNESIA) suspension 30 mL  30 mL Oral Daily PRN Ethelene Hal, NP       melatonin tablet 5 mg  5 mg Oral QHS Hill, Jackie Plum, MD   5 mg at 11/29/21 2058   OLANZapine (ZYPREXA) tablet 5 mg  5 mg Oral QHS Damita Dunnings B, MD       OLANZapine zydis (ZYPREXA) disintegrating tablet 5 mg  5 mg Oral Q8H PRN Massengill, Ovid Curd, MD   5 mg at 11/25/21 1253   And   ziprasidone (GEODON) injection 20 mg  20 mg Intramuscular PRN Massengill, Ovid Curd, MD       pantoprazole (PROTONIX) EC tablet 20 mg  20 mg Oral Daily Rosezetta Schlatter, MD   20 mg at 11/30/21 3546   propranolol (INDERAL) tablet 20 mg  20 mg Oral TID Janine Limbo, MD   20 mg at 11/30/21 1318   temazepam (RESTORIL) capsule 30 mg  30 mg Oral QHS Hill, Jackie Plum, MD   30 mg at 11/29/21 2057   white petrolatum (VASELINE) gel   Topical PRN Rosezetta Schlatter, MD   1 application at 56/81/27 2338   ziprasidone (GEODON) capsule 80 mg  80 mg Oral BID WC Briant Cedar, MD   80 mg at 11/30/21 5170    Lab Results:  Results for orders placed or performed during the hospital encounter of 11/23/21 (from the past 48 hour(s))  Basic metabolic panel     Status: Abnormal   Collection Time: 11/29/21  6:13 AM  Result Value Ref Range   Sodium 143 135 - 145 mmol/L   Potassium 4.0 3.5 - 5.1  mmol/L   Chloride 108 98 - 111 mmol/L   CO2 27 22 - 32 mmol/L   Glucose, Bld 112 (H) 70 - 99 mg/dL    Comment: Glucose reference range applies only to samples taken after fasting for at least 8 hours.   BUN 22 (  H) 6 - 20 mg/dL   Creatinine, Ser 1.31 (H) 0.44 - 1.00 mg/dL   Calcium 10.1 8.9 - 10.3 mg/dL   GFR, Estimated 48 (L) >60 mL/min    Comment: (NOTE) Calculated using the CKD-EPI Creatinine Equation (2021)    Anion gap 8 5 - 15    Comment: Performed at [facility], [place] [address], [place], [place] [address]  TSH     Status: Abnormal   Collection Time: 11/29/21  6:13 AM  Result Value Ref Range   TSH 12.328 (H) 0.350 - 4.500 uIU/mL    Comment: Performed by a 3rd Generation assay with a functional sensitivity of <=0.01 uIU/mL. Performed at [facility], [place] [address] [place], [place] [address]   CK     Status: None   Collection Time: 11/29/21  6:13 AM  Result Value Ref Range   Total CK 223 38 - 234 U/L    Comment: Performed at [facility], [place] [address], [place], [place] [address]  Basic metabolic panel     Status: Abnormal   Collection Time: 11/30/21  6:22 AM  Result Value Ref Range   Sodium 138 135 - 145 mmol/L   Potassium 3.9 3.5 - 5.1 mmol/L   Chloride 105 98 - 111 mmol/L   CO2 26 22 - 32 mmol/L   Glucose, Bld 110 (H) 70 - 99 mg/dL    Comment: Glucose reference range applies only to samples taken after fasting for at least 8 hours.   BUN 17 6 - 20 mg/dL   Creatinine, Ser 1.20 (H) 0.44 - 1.00 mg/dL   Calcium 9.7 8.9 - 10.3 mg/dL   GFR, Estimated 53 (L) >60 mL/min    Comment: (NOTE) Calculated using the CKD-EPI Creatinine Equation (2021)    Anion gap 7 5 - 15    Comment: Performed at [facility], [place] [address], [place], [place] [address]    Blood Alcohol level:  Lab Results  Component Value Date   [facility] <10 11/22/2021   ETH <10 76/19/5093    Metabolic Disorder Labs: Lab Results   Component Value Date   HGBA1C 6.1 (H) 11/24/2021   MPG 128.37 11/24/2021   MPG 119.76 03/28/2021   Lab Results  Component Value Date   PROLACTIN 11.0 03/14/2019   Lab Results  Component Value Date   CHOL 127 11/24/2021   TRIG 161 (H) 11/24/2021   HDL 39 (L) 11/24/2021   CHOLHDL 3.3 11/24/2021   VLDL 32 11/24/2021   LDLCALC 56 11/24/2021   LDLCALC 95 10/26/2020    Physical Findings: AIMS: Facial and Oral Movements Muscles of Facial Expression: None, normal Lips and Perioral Area: None, normal Jaw: None, normal Tongue: None, normal,Extremity Movements Upper (arms, wrists, hands, fingers): None, normal Lower (legs, knees, ankles, toes): None, normal, Trunk Movements Neck, shoulders, hips: None, normal, Overall Severity Severity of abnormal movements (highest score from questions above): None, normal Incapacitation due to abnormal movements: None, normal Patient's awareness of abnormal movements (rate only patient's report): No Awareness, Dental Status Current problems with teeth and/or dentures?: No Does patient usually wear dentures?: No  CIWA:    COWS:     Musculoskeletal: Strength & Muscle Tone: within normal limits Gait & Station: unsteady Patient leans: N/A  Psychiatric Specialty Exam:  Presentation  General Appearance: Appropriate for Environment  Eye Contact:Fleeting  Speech:Clear and Coherent  Speech Volume:Increased  Handedness:Right   Mood and Affect  Mood:Euthymic  Affect:Congruent   Thought  Process  Thought Processes:Disorganized  Descriptions of Associations:Tangential  Orientation:Full (Time, Place and Person)  Thought Content:Illogical; Delusions; Scattered  History of Schizophrenia/Schizoaffective disorder:Yes  Duration of Psychotic Symptoms:Greater than six months  Hallucinations:Hallucinations: None Description of Auditory Hallucinations: hears a flute  Ideas of Reference:Paranoia; Delusions  Suicidal Thoughts:Suicidal  Thoughts: No  Homicidal Thoughts:Homicidal Thoughts: No   Sensorium  Memory:Immediate Fair; Recent Poor; Remote Poor  Judgment:Impaired  Insight:None   Executive Functions  Concentration:Poor  Attention Span:Poor  [place]  Language:Poor   Psychomotor Activity  Psychomotor Activity:Psychomotor Activity: Increased; Restlessness   Assets  Assets:Resilience   Sleep  Sleep:Sleep: Poor    Physical Exam: Physical Exam HENT:     Head: Normocephalic and atraumatic.  Pulmonary:     Effort: Pulmonary effort is normal.  Neurological:     Mental Status: She is alert and oriented to person, place, and time.   Review of Systems  Psychiatric/Behavioral:  Negative for suicidal ideas.   Blood pressure (!) 147/96, pulse 100, temperature 97.6 F (36.4 C), temperature source Oral, resp. rate 18, height 5' (1.524 m), weight 76.7 kg, SpO2 98 %. Body mass index is 33.01 kg/m.   Treatment Plan Summary: Daily contact with patient to assess and evaluate symptoms and progress in treatment and Medication management Crystalina is a 56 yo patient w/ PPH of schizophrenia. Patient was Aox3 today and able to spell world backwards. Patient was also aware of the current President and VP. This decreases concern that patient's presentation is due to dementia and her decreased concentration and attention is more due to her thought disorder. Ultimately patient remains floridly psychotic. Patient is also not sleeping at night with only 2.75 h logged, and during the day she is starting to appear more unsteady and will often sit down and then lay down, but does not go to sleep during the day. Patient is complaint with her restoril but not sleeping but has unsteady gait. Patient requires a more sedating antipsychotic and mood stabilizer as her decrease sleep will only worsen her thought disorder and further increase her fall risk. Would like to minimize anticholinergic medication  due to her fall risk and to preserve cognitive function. Patient was in agreement to start Zyprexa and Depakote and was able to endorse previous hx of doing well on these medications.   Schizoaffective Disorder- Bipolar type: -Continue Geodon 80 mg BID -Continue Restoril 30 mg QHS -Discontinue Trileptal 150mg  BID - Discontinue Risperdal M-tabs 0.25 BID - Start Depakote 1250mg  (weight based dosing, for a patient sensitive to medications and medically fragile) - Start Zyprexa 5mg  QHS, intend to titrate up as tolerated - Continue Amantadine 100mg  BID, EPS prophylaxis   HTN   Tachycardia: Patient may be attempting to compensate for elevated Cr.  -Vitals (1/3) : BP - 157/84 HR: 88 -Continue Propanolol 20 mg TID -Continue to hold Losartan 25 mg daily     AKI: Reached out Hospitalist (please see note) Recommend pushing PO fluids. -Creatinine improving - 1.29>1.18 -Daily BMP -Encourage PO fluid intake: Gatorade and H2O     Hyperthyroidism: Spoke with hospitalist (1/3)  -Discontinue Methimazole 10 mg BID -TSH (12/29)- 6.847>>> 12.328 (1/3) -Follow up with PCP for further management     -Continue Protonix 20 mg daily -Continue PRN's: Tylenol, Maalox, Atarax, Milk of Magnesia, Trazodone  PGY-2 Freida Busman, MD 11/30/2021, 3:15 PM

## 2021-11-30 NOTE — Plan of Care (Signed)
°  Problem: Education: °Goal: Emotional status will improve °Outcome: Progressing °Goal: Mental status will improve °Outcome: Progressing °Goal: Verbalization of understanding the information provided will improve °Outcome: Progressing °  °

## 2021-11-30 NOTE — Group Note (Signed)
Recreation Therapy Group Note   Group Topic:Other  Group Date: 11/30/2021 Start Time: 5797 End Time: 2820 Facilitators: [name], LRT,CTRS Location: [address] Dayroom   Goal Area(s) Addresses:  Patient will identify what triggers them. Patient will identify how to deal with triggers. Patient will identify how triggers can be managed post d/c.  Group Description: Triggers.  Patients were given a worksheet to identify what triggers were.  Patients then identified what things triggers them.  Patients also identified how to avoid triggers and how to deal with triggers head on. Patients would also express how they would manage triggers post d/c.   Affect/Mood: Manic   Participation Level: Hyperverbal   Participation Quality: Moderate Cues   Behavior: Hyperverbal and Off-task   Speech/Thought Process: Disorganized   Insight: Lacking   Judgement: Lacking    Modes of Intervention: Worksheet   Patient Response to Interventions:  Receptive   Education Outcome:  Acknowledges education and In group clarification offered    Clinical Observations/Individualized Feedback: Pt was bright but had uncontrollable laughing and was very hyper verbal.  Pt couldn't stay on task and would veer off to topics that had nothing to do with group.  Pt was called out of group to speak with the doctor and did not return.    Plan: Continue to engage patient in RT group sessions 2-3x/week.   [name], Glennis Brink  11/30/2021 12:53 PM

## 2021-11-30 NOTE — Group Note (Signed)
LCSW Group Therapy Notes   Date and Time: 11/30/2021 1:00pm  Type of Therapy and Topic: Group Therapy: Worry and Anxiety  Participation Level: [facility] PARTICIPATION LEVEL: Did Not Attend  Description of Group: In this group, patients will be encouraged to explore their worry around what could happen vs what will happen. Each patient will be challenged to think of personal worries and how they will work their way through that worry around what will happen and what could happen. This group will be process-oriented, with patients participating in exploration of their own experiences as well as giving and receiving support and challenge from other group members.  Therapeutic Goals: Patient will identify personal worries that cause anxiety. Patient will identify clues to identify their worry. Patient will identify ways to handle their worry. Patient will discuss ways that their worry has deceased or why it has not decreased.   Summary of Patient Progress: Did not attend   Therapeutic Modalities:  Cognitive Behavioral Therapy Solution Focused Therapy Motivational Interviewing    Darletta Moll MSW, [place] Worker  [facility]

## 2021-12-01 DIAGNOSIS — F250 Schizoaffective disorder, bipolar type: Secondary | ICD-10-CM | POA: Diagnosis not present

## 2021-12-01 MED ORDER — OLANZAPINE 10 MG IM SOLR
5.0000 mg | Freq: Every day | INTRAMUSCULAR | Status: DC
  Filled 2021-12-01 (×3): qty 10

## 2021-12-01 MED ORDER — OLANZAPINE 5 MG PO TABS
5.0000 mg | ORAL_TABLET | Freq: Every day | ORAL | Status: DC
  Administered 2021-12-01: 5 mg via ORAL
  Filled 2021-12-01 (×3): qty 1

## 2021-12-01 MED ORDER — ZIPRASIDONE MESYLATE 20 MG IM SOLR
20.0000 mg | Freq: Two times a day (BID) | INTRAMUSCULAR | Status: DC
  Administered 2021-12-07 – 2021-12-10 (×4): 20 mg via INTRAMUSCULAR
  Filled 2021-12-01 (×24): qty 20

## 2021-12-01 MED ORDER — ZIPRASIDONE HCL 80 MG PO CAPS
80.0000 mg | ORAL_CAPSULE | Freq: Two times a day (BID) | ORAL | Status: DC
  Administered 2021-12-01 – 2021-12-10 (×14): 80 mg via ORAL
  Filled 2021-12-01 (×22): qty 1

## 2021-12-01 NOTE — Progress Notes (Signed)
Pt was encouraged but didn't attend orientation/goals group.

## 2021-12-01 NOTE — Progress Notes (Signed)
Pt continues to be labile, pt continues to oppositional and defiant especially towards Probation officer. Another RN Production manager) and MHT (Sir) were able to Acupuncturist to take PO medications after pt refused them from Probation officer.    12/01/21 2100  Psych Admission Type (Psych Patients Only)  Admission Status Voluntary  Psychosocial Assessment  Patient Complaints Anxiety;Worrying;Irritability  Eye Contact Fair  Facial Expression Animated;Anxious;Pensive  Affect Anxious;Preoccupied  Speech Incoherent;Pressured;Tangential  Interaction Assertive;Attention-seeking  Motor Activity Slow  Appearance/Hygiene Unremarkable  Behavior Characteristics Agitated;Anxious;Resistant to care  Mood Suspicious;Preoccupied  Thought Process  Coherency Disorganized;Flight of ideas;Tangential  Content Preoccupation  Delusions None reported or observed  Perception Hallucinations  Hallucination Auditory;Visual  Judgment Poor  Confusion Moderate  Danger to Self  Current suicidal ideation? Denies  Danger to Others  Danger to Others None reported or observed

## 2021-12-01 NOTE — Progress Notes (Signed)
Pt continues to be confrontational and argumentative with this Probation officer, like she normally is when she is first admitted to the hospital

## 2021-12-01 NOTE — Progress Notes (Signed)
Pt refused her HS medication , pt stayed in her room and was not disruptive during the night.

## 2021-12-01 NOTE — Progress Notes (Signed)
Dar Note: Patient present with bizarre behaviors, disorganized and restless.  Observed pacing the hallway, yelling and very loud on the unit.  Zyprexa 5 mg given for increased agitation with good effect. Routine safety checks maintained. Patient offered support and encouragement as needed.  Patient is safe on the unit.

## 2021-12-01 NOTE — Progress Notes (Signed)
Pt was encouraged but didn't attend therapeutic relaxation group. ?

## 2021-12-01 NOTE — Progress Notes (Signed)
[facility] MD Progress Note  12/01/2021 4:41 PM Paula Dickson  MRN:  [id number] Subjective:  Paula Dickson is a 56 yr old female who presents with bizarre behaviors and nonsensical speech after medication noncompliance.  PPHx is significant for Schizoaffective Disorder- Bipolar type complicated by medication noncompliance and previous suicide attempt (OD in 2018), multiple hospitalizations (latest 11/21 [facility]).     Case was discussed in the multidisciplinary team. MAR was reviewed and patient was NOT compliant with medications.Patient refused her night time medications and was paranoid and argumentative with night RN. Patient required FORCED MED ORDER this AM and second opinion, please refer to Dr. Rodney Langton note.   Later this AM after assessment patient suddenly became very loud and threw her cup of water in the hall. Patient did require PRN Zyprexa, but did take it PO and responded well. Patient had not further PRN needs in between this event and writing this note.      Psychiatric Team made the following recommendations yesterday: -Continue Geodon 80 mg BID -Continue Restoril 30 mg QHS -Discontinue Trileptal 150mg  BID - Discontinue Risperdal M-tabs 0.25 BID - Start Depakote 1250mg  (weight based dosing, for a patient sensitive to medications and medically fragile) - Start Zyprexa 5mg  QHS, intend to titrate up as tolerated - Continue Amantadine 100mg  BID, EPS prophylaxis -Continue Propanolol 20 mg TID -Continue to hold Losartan 25 mg daily  Patient talks about wanting an orange and reports she is only concerned about her kidneys. Patient denies SI, HI and is not clear if she is having VH. Patient endorses AH reporting God speaks to her specifically. Patient is much more difficult to understand today (more pressured/ not finishing most of her thoughts) and very hard to redirect. Patient talks about her brother and wants to call her brother but her attention span is very short and patient walks around  from the phone to her room while attempting assessment and is shouting. Patient is noted to say she wants to rest, but per her chart has not been sleeping at night nor during the day.     Principal Problem: Schizoaffective disorder, bipolar type ([place]) Diagnosis: Principal Problem:   Schizoaffective disorder, bipolar type ([place]) Active Problems:   Hyperthyroidism   Benign essential HTN   AKI (acute kidney injury) ([place])   Cognitive dysfunction in chronic schizophrenia ([place])  Total Time spent with patient: 20 minutes  Past Psychiatric History:  See H&P  Past Medical History:  Past Medical History:  Diagnosis Date   Bipolar affective disorder ([place])    Bipolar disorder ([place])    History of arthritis    History of chicken pox    History of depression    History of genital warts    history of heart murmur    History of high blood pressure    History of thyroid disease    History of UTI    Hypertension    Low TSH level 07/13/2017   Schizophrenia ([place])     Past Surgical History:  Procedure Laterality Date   ABLATION ON ENDOMETRIOSIS     CYST REMOVAL NECK     around 11 years ago /benign   MULTIPLE TOOTH EXTRACTIONS     Family History:  Family History  Problem Relation Age of Onset   Arthritis Father    Hyperlipidemia Father    High blood pressure Father    Diabetes Sister    Diabetes Mother    Diabetes Brother    Mental illness Brother    Alcohol abuse  Paternal Uncle    Alcohol abuse Paternal Grandfather    Breast cancer Maternal Aunt    Breast cancer Paternal Aunt    High blood pressure Sister    Mental illness Other        runs in family   Family Psychiatric  History: See H&P Social History:  Social History   Substance and Sexual Activity  Alcohol Use Not Currently     Social History   Substance and Sexual Activity  Drug Use Not Currently    Social History   Socioeconomic History   Marital status: Single    Spouse name: Not on file   Number of  children: Not on file   Years of education: Not on file   Highest education level: Patient refused  Occupational History   Not on file  Tobacco Use   Smoking status: Never   Smokeless tobacco: Never  Vaping Use   Vaping Use: Never used  Substance and Sexual Activity   Alcohol use: Not Currently   Drug use: Not Currently   Sexual activity: Not Currently  Other Topics Concern   Not on file  Social History Narrative   ** Merged History Encounter **       Social Determinants of Health   Financial Resource Strain: Not on file  Food Insecurity: Not on file  Transportation Needs: Not on file  Physical Activity: Not on file  Stress: Not on file  Social Connections: Not on file   Additional Social History:                         Sleep: Poor 3.25h  Appetite:  Good  Current Medications: Current Facility-Administered Medications  Medication Dose Route Frequency Provider Last Rate Last Admin   acetaminophen (TYLENOL) tablet 650 mg  650 mg Oral Q6H PRN Ethelene Hal, NP   650 mg at 11/27/21 2347   alum & mag hydroxide-simeth (MAALOX/MYLANTA) 200-200-20 MG/5ML suspension 30 mL  30 mL Oral Q4H PRN Ethelene Hal, NP       amantadine (SYMMETREL) capsule 100 mg  100 mg Oral BID Damita Dunnings B, MD   100 mg at 12/01/21 0827   divalproex (DEPAKOTE ER) 24 hr tablet 1,250 mg  1,250 mg Oral Q2000 Rayquon Uselman B, MD       magnesium hydroxide (MILK OF MAGNESIA) suspension 30 mL  30 mL Oral Daily PRN Ethelene Hal, NP       melatonin tablet 5 mg  5 mg Oral QHS Hill, Jackie Plum, MD   5 mg at 11/29/21 2058   OLANZapine (ZYPREXA) tablet 5 mg  5 mg Oral QHS Massengill, Nathan, MD       Or   OLANZapine (ZYPREXA) injection 5 mg  5 mg Intramuscular QHS Massengill, Nathan, MD       OLANZapine zydis (ZYPREXA) disintegrating tablet 5 mg  5 mg Oral Q8H PRN Massengill, Ovid Curd, MD   5 mg at 12/01/21 1203   And   ziprasidone (GEODON) injection 20 mg  20 mg  Intramuscular PRN Massengill, Ovid Curd, MD       pantoprazole (PROTONIX) EC tablet 20 mg  20 mg Oral Daily Rosezetta Schlatter, MD   20 mg at 12/01/21 0827   propranolol (INDERAL) tablet 20 mg  20 mg Oral TID Janine Limbo, MD   20 mg at 12/01/21 1202   temazepam (RESTORIL) capsule 30 mg  30 mg Oral QHS Hill, Jackie Plum, MD   30  mg at 11/29/21 2057   white petrolatum (VASELINE) gel   Topical PRN Rosezetta Schlatter, MD   1 application at 35/57/32 2338   ziprasidone (GEODON) capsule 80 mg  80 mg Oral BID WC Massengill, Ovid Curd, MD       Or   ziprasidone (GEODON) injection 20 mg  20 mg Intramuscular BID WC Massengill, Ovid Curd, MD        Lab Results:  Results for orders placed or performed during the hospital encounter of 11/23/21 (from the past 48 hour(s))  Basic metabolic panel     Status: Abnormal   Collection Time: 11/30/21  6:22 AM  Result Value Ref Range   Sodium 138 135 - 145 mmol/L   Potassium 3.9 3.5 - 5.1 mmol/L   Chloride 105 98 - 111 mmol/L   CO2 26 22 - 32 mmol/L   Glucose, Bld 110 (H) 70 - 99 mg/dL    Comment: Glucose reference range applies only to samples taken after fasting for at least 8 hours.   BUN 17 6 - 20 mg/dL   Creatinine, Ser 1.20 (H) 0.44 - 1.00 mg/dL   Calcium 9.7 8.9 - 10.3 mg/dL   GFR, Estimated 53 (L) >60 mL/min    Comment: (NOTE) Calculated using the CKD-EPI Creatinine Equation (2021)    Anion gap 7 5 - 15    Comment: Performed at [facility], [place] [address] [place], [place] [address]    Blood Alcohol level:  Lab Results  Component Value Date   [facility] <10 11/22/2021   ETH <10 27/04/2375    Metabolic Disorder Labs: Lab Results  Component Value Date   HGBA1C 6.1 (H) 11/24/2021   MPG 128.37 11/24/2021   MPG 119.76 03/28/2021   Lab Results  Component Value Date   PROLACTIN 11.0 03/14/2019   Lab Results  Component Value Date   CHOL 127 11/24/2021   TRIG 161 (H) 11/24/2021   HDL 39 (L) 11/24/2021   CHOLHDL 3.3 11/24/2021    VLDL 32 11/24/2021   LDLCALC 56 11/24/2021   LDLCALC 95 10/26/2020    Physical Findings: AIMS: Facial and Oral Movements Muscles of Facial Expression: None, normal Lips and Perioral Area: None, normal Jaw: None, normal Tongue: None, normal,Extremity Movements Upper (arms, wrists, hands, fingers): None, normal Lower (legs, knees, ankles, toes): None, normal, Trunk Movements Neck, shoulders, hips: None, normal, Overall Severity Severity of abnormal movements (highest score from questions above): None, normal Incapacitation due to abnormal movements: None, normal Patient's awareness of abnormal movements (rate only patient's report): No Awareness, Dental Status Current problems with teeth and/or dentures?: No Does patient usually wear dentures?: No  CIWA:    COWS:     Musculoskeletal: Strength & Muscle Tone: within normal limits Gait & Station: normal Patient leans: N/A  Psychiatric Specialty Exam:  Presentation  General Appearance: Casual (in psych scrubs, closthes are dirty, but she did not want to tell staff)  Eye Contact:Fleeting (looks very suspiciously at people today)  Speech:Pressured  Speech Volume:Increased  Handedness:Right   Mood and Affect  Mood:Labile; Irritable  Affect:Congruent   Thought Process  Thought Processes:Disorganized  Descriptions of Associations:Loose  Orientation:Partial (New she was in [place] but was not able to recall [name] on her own, otherwise AO to person and time)  Thought Content:Delusions; Paranoid Ideation  History of Schizophrenia/Schizoaffective disorder:Yes  Duration of Psychotic Symptoms:Greater than six months  Hallucinations:Hallucinations: Auditory  Ideas of Reference:Delusions; Paranoia  Suicidal Thoughts:Suicidal Thoughts: No  Homicidal Thoughts:Homicidal Thoughts: No   Sensorium  Memory:Immediate Poor; Recent Poor  Judgment:Impaired  Insight:None   Executive Functions   Concentration:Poor  Attention Span:Poor  Recall:Poor  Fund of Knowledge:Poor  Language:Poor   Psychomotor Activity  Psychomotor Activity:Psychomotor Activity: Restlessness   Assets  Assets:Resilience   Sleep  Sleep:Sleep: Poor    Physical Exam: Physical Exam HENT:     Head: Normocephalic and atraumatic.  Pulmonary:     Effort: Pulmonary effort is normal.  Neurological:     Mental Status: She is alert.   Review of Systems  Psychiatric/Behavioral:  Positive for hallucinations. Negative for suicidal ideas.   Blood pressure (!) 153/101, pulse 85, temperature 98.3 F (36.8 C), temperature source Oral, resp. rate 18, height 5' (1.524 m), weight 76.7 kg, SpO2 99 %. Body mass index is 33.01 kg/m.   Treatment Plan Summary: Daily contact with patient to assess and evaluate symptoms and progress in treatment and Medication management Paula Dickson is a 56 yo patient w/ PPH of schizophrenia.Patient suddenly became non complaint with her medications last night and missed the dosages. Patient appeared significantly worse today and was very paranoid, loud, disorganized, and more concerning for dangerous behavior. Patient did not do as well on orientation questions today and was not really able to complete a single sentence today. Patient was noted to be far more restless as well. Patient was notably less pleasant today and more likely to have paranoid interactions with  staff and patients. Schizoaffective Disorder- Bipolar type:  FORCED MED ORDER Started 1/5  - 1/12!!!! - Geodon 80mg  BID PO or 20mg  IM if refuses PO - Zyprexa 5mg  PO QHS or 5mg  IM if refuses PO  Regular scheduled meds -Continue Geodon 80 mg BID -Continue Restoril 30 mg QHS - Start Depakote 1250mg  (weight based dosing, for a patient sensitive to medications and medically fragile) - Start Zyprexa 5mg  QHS, intend to titrate up as tolerated - Continue Amantadine 100mg  BID, EPS prophylaxis   HTN   Tachycardia: Patient  may be attempting to compensate for elevated Cr.  -Continue Propanolol 20 mg TID -Continue to hold Losartan 25 mg daily     AKI: Reached out Hospitalist (please see note) Recommend pushing PO fluids. -Creatinine improving - 1.29>1.18>1.31>1.20 -Daily BMP -Encourage PO fluid intake: Gatorade and H2O     Hyperthyroidism: Spoke with hospitalist (1/3)  -Discontinued Methimazole 10 mg BID -TSH (12/29)- 6.847>>> 12.328 (1/3) -Follow up with PCP for further management     -Continue Protonix 20 mg daily -Continue PRN's: Tylenol, Maalox, Atarax, Milk of Magnesia, Trazodone   PGY-2 Freida Busman, MD 12/01/2021, 4:41 PM

## 2021-12-01 NOTE — Progress Notes (Signed)
[facility] Second Physician Opinion Progress Note for Medication Administration to Non-consenting Patients (For Involuntarily Committed Patients)  Patient: Paula Dickson Date of Birth: [id number] MRN: [id number]  Reason for the Medication: The patient, without the benefit of the specific treatment measure, is incapable of participating in any available treatment plan that will give the patient a realistic opportunity of improving the patient's condition. There is, without the benefit of the specific treatment measure, a significant possibility that the patient will harm self or others before improvement of the patient's condition is realized.  Consideration of Side Effects: Consideration of the side effects related to the medication plan has been given.  Rationale for Medication Administration:  I met with this patient in her room in the presence of the treatment team. The patient cannot articulate an understanding of her diagnosis or need for hospitalization and cannot engage in a meaningful conversation about her medications. She is disorganized, labile, rambling, and making illogical statements on exam. She endorses AH of God and makes frequent hyper-religious references on exam. She appears disheveled and at times is argumentative and irritable during interview. She appears paranoid on exam and is defensive when attempting to discuss forced medication over objection protocol.   Consultation for forced medication protocol was requested because the patient has started refusing po medications and appears to acutely have decompensated further as a result of missed medication doses. At this time I am in agreement that the patient needs to continue an antipsychotic in order to have a realistic expectation for improving her paranoia and managing her residual delusions/psychosis. It is my opinion that should the patient refuse medications that this would lead to more risk of potential self-harm than if she was  on a protocol for medications against objection. Based on my overall assessment, I believe the benefits of administering medication to the patient significantly outweigh the risks.  The patient would benefit from medication treatment which will be forced as she is incapable of participating advisable treatment plan at this time.     Harlow Asa, MD, FAPA 12/01/21  10:21 AM   This documentation is good for (7) seven days from the date of the MD signature. New documentation must be completed every seven (7) days with detailed justification in the medical record if the patient requires continued non-emergent administration of psychotropic medications.

## 2021-12-01 NOTE — Progress Notes (Signed)
Psychoeducational Group Note  Date:  12/01/2021 Time:  2020  Group Topic/Focus:  Wrap-Up Group:   The focus of this group is to help patients review their daily goal of treatment and discuss progress on daily workbooks.  Participation Level: Did Not Attend  Participation Quality:  Not Applicable  Affect:  Not Applicable  Cognitive:  Not Applicable  Insight:  Not Applicable  Engagement in Group: Not Applicable  Additional Comments:  The patient did not attend group this evening.   Archie Balboa S 12/01/2021, 8:20 PM

## 2021-12-01 NOTE — Progress Notes (Signed)
°   12/01/21 0500  Sleep  Number of Hours 3.25

## 2021-12-01 NOTE — Group Note (Signed)
Recreation Therapy Group Note   Group Topic:Communication  Group Date: 12/01/2021 Start Time: 0945 End Time: 1025 Facilitators: [name], LRT,CTRS Location: [address] Dayroom   Goal Area(s) Addresses:  Patient will effectively listen to complete activity.  Patient will identify communication skills used to make activity successful.  Patient will identify how skills used during activity can be used to reach post d/c goals.    Group Description: Geometric Drawings.  Three volunteers from the peer group will be shown an abstract picture with a particular arrangement of geometrical shapes.  Each round, one 'speaker' will describe the pattern, as accurately as possible without revealing the image to the group.  The remaining group members will listen and draw the picture to reflect how it is described to them. Patients with the role of 'listener' cannot ask clarifying questions but, may request that the speaker repeat a direction. Once the drawings are complete, the presenter will show the rest of the group the picture and compare how close each person came to drawing the picture. LRT will facilitate a post-activity discussion regarding effective communication and the importance of planning, listening, and asking for clarification in daily interactions with others.   Affect/Mood: N/A   Participation Level: Did not attend    Clinical Observations/Individualized Feedback:     Plan: Continue to engage patient in RT group sessions 2-3x/week.   [name], LRT,CTRS 12/01/2021 11:48 AM

## 2021-12-01 NOTE — Group Note (Signed)
Occupational Therapy Group Note  Group Topic:Feelings Management  Group Date: 12/01/2021 Start Time: 1400 End Time: 1435 Facilitators: Ponciano Ort, OT    Group Description: Group encouraged increased engagement and participation through discussion focused on Building Happiness. Patients were provided a handout and reviewed therapeutic strategies to build happiness including identifying gratitudes, random acts of kindness, exercise, meditation, positive journaling, and fostering relationships. Patients engaged in discussion and encouraged to reflect on each strategy and their experiences.  Therapeutic Goal(s): Identify strategies to build happiness. Identify and implement therapeutic strategies to improve overall mood. Practice and identify gratitudes, random acts of kindness, exercise, meditation, positive journaling, and fostering relationships   Participation Level: Did not attend   Plan: Continue to engage patient in OT groups 2 - 3x/week.   12/01/2021  Ponciano Ort, MOT, OTR/L

## 2021-12-02 DIAGNOSIS — F250 Schizoaffective disorder, bipolar type: Secondary | ICD-10-CM | POA: Diagnosis not present

## 2021-12-02 LAB — BASIC METABOLIC PANEL
Anion gap: 4 — ABNORMAL LOW (ref 5–15)
BUN: 21 mg/dL — ABNORMAL HIGH (ref 6–20)
CO2: 27 mmol/L (ref 22–32)
Calcium: 9.8 mg/dL (ref 8.9–10.3)
Chloride: 109 mmol/L (ref 98–111)
Creatinine, Ser: 1.56 mg/dL — ABNORMAL HIGH (ref 0.44–1.00)
GFR, Estimated: 39 mL/min — ABNORMAL LOW (ref >60)
Glucose, Bld: 103 mg/dL — ABNORMAL HIGH (ref 70–99)
Potassium: 4.1 mmol/L (ref 3.5–5.1)
Sodium: 140 mmol/L (ref 135–145)

## 2021-12-02 LAB — T4, FREE: Free T4: 0.84 ng/dL (ref 0.61–1.12)

## 2021-12-02 LAB — TSH: TSH: 6.397 u[IU]/mL — ABNORMAL HIGH (ref 0.350–4.500)

## 2021-12-02 MED ORDER — METHIMAZOLE 10 MG PO TABS
5.0000 mg | ORAL_TABLET | Freq: Two times a day (BID) | ORAL | Status: DC
  Administered 2021-12-02 – 2021-12-23 (×29): 5 mg via ORAL
  Filled 2021-12-02: qty 0.5
  Filled 2021-12-02: qty 1
  Filled 2021-12-02 (×8): qty 0.5
  Filled 2021-12-02: qty 1
  Filled 2021-12-02 (×35): qty 0.5

## 2021-12-02 MED ORDER — OLANZAPINE 5 MG PO TABS
5.0000 mg | ORAL_TABLET | Freq: Two times a day (BID) | ORAL | Status: DC
  Administered 2021-12-02 – 2021-12-06 (×8): 5 mg via ORAL
  Filled 2021-12-02 (×12): qty 1

## 2021-12-02 MED ORDER — OLANZAPINE 10 MG IM SOLR
5.0000 mg | Freq: Two times a day (BID) | INTRAMUSCULAR | Status: DC
  Filled 2021-12-02 (×12): qty 10

## 2021-12-02 NOTE — Progress Notes (Signed)
Pt continues to be disorganized at times, pt silly this evening at times, but compliant with PO medications    12/02/21 2000  Psych Admission Type (Psych Patients Only)  Admission Status Voluntary  Psychosocial Assessment  Patient Complaints Worrying  Eye Contact Fair  Facial Expression Animated;Anxious;Pensive  Affect Anxious;Preoccupied;Silly  Speech Incoherent;Pressured;Tangential  Interaction Assertive;Attention-seeking  Motor Activity Slow  Appearance/Hygiene Unremarkable  Behavior Characteristics Cooperative  Mood Preoccupied;Pleasant  Thought Process  Coherency Disorganized;Flight of ideas;Tangential  Content Preoccupation  Delusions None reported or observed  Perception Hallucinations  Hallucination Auditory;Visual  Judgment Poor  Confusion Moderate  Danger to Self  Current suicidal ideation? Denies  Danger to Others  Danger to Others None reported or observed

## 2021-12-02 NOTE — Progress Notes (Signed)
°   12/02/21 0600  Sleep  Number of Hours 5.5

## 2021-12-02 NOTE — Progress Notes (Signed)
Pt was encouraged but didn't attend therapeutic relaxation group. ?

## 2021-12-02 NOTE — Group Note (Signed)
Recreation Therapy Group Note   Group Topic:Team Building  Group Date: 12/02/2021 Start Time: 1008 End Time: 1035 Facilitators: [name], LRT,CTRS Location: [address] Dayroom   Goal Area(s) Addresses:  Patient will effectively work with peer towards shared goal.  Patient will identify skills used to make activity successful.  Patient will identify how skills used during activity can be used to reach post d/c goals.   Group Description: Straw Bridge. In teams of 3-5, patients were given 15 plastic drinking straws and an equal length of masking tape. Using the materials provided, patients were instructed to build a free standing bridge-like structure to suspend an everyday item (ex: puzzle box) off of the floor or table surface. All materials were required to be used by the team in their design. LRT facilitated post-activity discussion reviewing team process. Patients were encouraged to reflect how the skills used in this activity can be generalized to daily life post discharge.    Affect/Mood: Happy   Participation Level: None   Participation Quality: None   Behavior: Distracted and Hyperverbal   Speech/Thought Process: Disorganized   Insight: Poor   Judgement: Poor   Modes of Intervention: STEM Activity   Patient Response to Interventions:  Receptive   Education Outcome:  Acknowledges education and In group clarification offered    Clinical Observations/Individualized Feedback: Pt came late to group.  Pt arrived as group was almost over.  Pt was focused on other things such as what's going on with her feet.  Pt even mentioned something about not working at specific place anymore.  Pt was hyper verbal but redirectable.      Plan: Continue to engage patient in RT group sessions 2-3x/week.   [name], Glennis Brink 12/02/2021 12:31 PM

## 2021-12-02 NOTE — Group Note (Signed)
Type of Therapy and Topic: Group Therapy: Gratitude  Participation: None  Description of Group: The purpose behind this group is to get people thinking about things for which  they can be grateful. If continued over time, they might begin to spontaneously look for things and  situations for which to be grateful. Gratitude is related to a wide variety of forms of wellbeing , whereas negative attributions can adversely affect relationships.  Several studies have shown that interventions to  increase gratitude can impact areas such as overall life satisfaction, decreased negative affect, increased happiness, the ability to provide emotional support to others, and decreased worrying.  Summary of Progress: Pt came to group at the very end and laughed the entire time. Group members were able to appreciate laughter and impact on emotions.   Therapeutic Goals: Patient will learn activities that focus on gratitude in their daily lives. Patient will share gratitude in their daily lives. Patient will learn to develop healthy habits and positive thinking techniques. Patient will receive support and feedback from others   Therapeutic Modalities: Cognitive Behavioral Therapy Solution Focused Therapy Motivational Interviewing

## 2021-12-02 NOTE — BHH Group Notes (Signed)
Adult Psychoeducational Group Note  Date:  12/02/2021 Time:  10:39 PM  Group Topic/Focus:  Goals Group:   The focus of this group is to help patients establish daily goals to achieve during treatment and discuss how the patient can incorporate goal setting into their daily lives to aide in recovery.  Participation Level:  Active  Participation Quality:  Redirectable  Affect:  Excited  Cognitive:  Alert  Insight: Appropriate  Engagement in Group:  Improving  Modes of Intervention:  Discussion  Additional Comments  Paula Dickson 12/02/2021, 10:39 PM

## 2021-12-02 NOTE — Progress Notes (Signed)
Pt was encouraged but didn't attend orientation/goals group.

## 2021-12-02 NOTE — Progress Notes (Signed)
DAR NOTE: Patient still bizarre, paranoid and disorganized.  Denies suicidal thoughts, auditory and visual hallucinations.  Rates depression at 0, hopelessness at 0, and anxiety at 0.  Maintained on routine safety checks.  Medications given as prescribed.  Support and encouragement offered as needed.  Attended group with little participation.  Patient observed pacing the hallway having nonsensical conversation staff with inappropriate laughter.  Needed multiple redirection on the unit. Patient is safe on the unit.

## 2021-12-02 NOTE — Progress Notes (Addendum)
[facility] MD Progress Note  12/02/2021 12:25 PM Paula Dickson  MRN:  [id number] Subjective:  Paula Dickson is a 56 yr old female who presents with bizarre behaviors and nonsensical speech after medication noncompliance.  PPHx is significant for Schizoaffective Disorder- Bipolar type complicated by medication noncompliance and previous suicide attempt (OD in 2018), multiple hospitalizations (latest 11/21 [facility]).    Case was discussed in the multidisciplinary team.  MAR was reviewed and patient was compliant with medications.  Yesterday, a FORCED MED ORDER evaluation was completed and placed into chart - please refer to Dr. Rodney Langton note. (IVC process started today, 12-02-2021, for forced meds).  Pt served with IVC on 12-02-21.  24 hour eval for IVC completed 12-02-21 afternoon.    Psychiatric Team made the following recommendations yesterday: -Continue Geodon 80 mg BID  -Continue Restoril 30 mg QHS - Continue Depakote 1250mg   - Continue Zyprexa 5mg  QHS, intend to titrate up as tolerated - Continue Amantadine 100mg  BID, EPS prophylaxis -Continue Propanolol 20 mg TID -Continue to hold Losartan 25 mg daily  The patient took her medications overnight, and is not more psychotic than yesterday. She remains very psychotic, disorganized, illogical, irrational, and paranoid. She appears to be responding to internal stimuli, but denies AH and VH (which she reported yesterday). She is overall a poor historian due to severity of psychiatric illness.  Later in the morning, she is observed to walking down the hallway, with pants and clothes on, saying 'where are my drawers? I don't have any drawers on. They must be in the laundry." She denies SI and HI.      Principal Problem: Schizoaffective disorder, bipolar type ([place]) Diagnosis: Principal Problem:   Schizoaffective disorder, bipolar type ([place]) Active Problems:   Hyperthyroidism   Benign essential HTN   AKI (acute kidney injury) ([place])   Cognitive  dysfunction in chronic schizophrenia ([place])  Total Time spent with patient: 20 minutes  Past Psychiatric History:  See H&P  Past Medical History:  Past Medical History:  Diagnosis Date   Bipolar affective disorder ([place])    Bipolar disorder ([place])    History of arthritis    History of chicken pox    History of depression    History of genital warts    history of heart murmur    History of high blood pressure    History of thyroid disease    History of UTI    Hypertension    Low TSH level 07/13/2017   Schizophrenia ([place])     Past Surgical History:  Procedure Laterality Date   ABLATION ON ENDOMETRIOSIS     CYST REMOVAL NECK     around 11 years ago /benign   MULTIPLE TOOTH EXTRACTIONS     Family History:  Family History  Problem Relation Age of Onset   Arthritis Father    Hyperlipidemia Father    High blood pressure Father    Diabetes Sister    Diabetes Mother    Diabetes Brother    Mental illness Brother    Alcohol abuse Paternal Uncle    Alcohol abuse Paternal Grandfather    Breast cancer Maternal Aunt    Breast cancer Paternal Aunt    High blood pressure Sister    Mental illness Other        runs in family   Family Psychiatric  History: See H&P Social History:  Social History   Substance and Sexual Activity  Alcohol Use Not Currently     Social History   Substance  and Sexual Activity  Drug Use Not Currently    Social History   Socioeconomic History   Marital status: Single    Spouse name: Not on file   Number of children: Not on file   Years of education: Not on file   Highest education level: Patient refused  Occupational History   Not on file  Tobacco Use   Smoking status: Never   Smokeless tobacco: Never  Vaping Use   Vaping Use: Never used  Substance and Sexual Activity   Alcohol use: Not Currently   Drug use: Not Currently   Sexual activity: Not Currently  Other Topics Concern   Not on file  Social History Narrative   ** Merged  History Encounter **       Social Determinants of Health   Financial Resource Strain: Not on file  Food Insecurity: Not on file  Transportation Needs: Not on file  Physical Activity: Not on file  Stress: Not on file  Social Connections: Not on file   Additional Social History:                         Sleep: Poor 3.25h  Appetite:  Good  Current Medications: Current Facility-Administered Medications  Medication Dose Route Frequency Provider Last Rate Last Admin   acetaminophen (TYLENOL) tablet 650 mg  650 mg Oral Q6H PRN Ethelene Hal, NP   650 mg at 11/27/21 2347   alum & mag hydroxide-simeth (MAALOX/MYLANTA) 200-200-20 MG/5ML suspension 30 mL  30 mL Oral Q4H PRN Ethelene Hal, NP       amantadine (SYMMETREL) capsule 100 mg  100 mg Oral BID Damita Dunnings B, MD   100 mg at 12/02/21 0841   divalproex (DEPAKOTE ER) 24 hr tablet 1,250 mg  1,250 mg Oral Q2000 McQuilla, Joyce Gross B, MD   1,250 mg at 12/01/21 2021   magnesium hydroxide (MILK OF MAGNESIA) suspension 30 mL  30 mL Oral Daily PRN Ethelene Hal, NP       melatonin tablet 5 mg  5 mg Oral QHS Hill, Jackie Plum, MD   5 mg at 12/01/21 2020   OLANZapine (ZYPREXA) tablet 5 mg  5 mg Oral QHS Aliya Sol, MD   5 mg at 12/01/21 2020   Or   OLANZapine (ZYPREXA) injection 5 mg  5 mg Intramuscular QHS Adisynn Suleiman, Ovid Curd, MD       OLANZapine zydis (ZYPREXA) disintegrating tablet 5 mg  5 mg Oral Q8H PRN Harshan Kearley, Ovid Curd, MD   5 mg at 12/01/21 1203   And   ziprasidone (GEODON) injection 20 mg  20 mg Intramuscular PRN Keyani Rigdon, Ovid Curd, MD       pantoprazole (PROTONIX) EC tablet 20 mg  20 mg Oral Daily Rosezetta Schlatter, MD   20 mg at 12/02/21 0842   propranolol (INDERAL) tablet 20 mg  20 mg Oral TID Janine Limbo, MD   20 mg at 12/02/21 0841   temazepam (RESTORIL) capsule 30 mg  30 mg Oral QHS Hill, Jackie Plum, MD   30 mg at 12/01/21 2021   white petrolatum (VASELINE) gel   Topical PRN  Rosezetta Schlatter, MD   1 application at 66/29/47 2338   ziprasidone (GEODON) capsule 80 mg  80 mg Oral BID WC Trecia Maring, Ovid Curd, MD   80 mg at 12/02/21 0841   Or   ziprasidone (GEODON) injection 20 mg  20 mg Intramuscular BID WC Janine Limbo, MD  Lab Results:  Results for orders placed or performed during the hospital encounter of 11/23/21 (from the past 48 hour(s))  Basic metabolic panel     Status: Abnormal   Collection Time: 12/02/21  6:24 AM  Result Value Ref Range   Sodium 140 135 - 145 mmol/L   Potassium 4.1 3.5 - 5.1 mmol/L   Chloride 109 98 - 111 mmol/L   CO2 27 22 - 32 mmol/L   Glucose, Bld 103 (H) 70 - 99 mg/dL    Comment: Glucose reference range applies only to samples taken after fasting for at least 8 hours.   BUN 21 (H) 6 - 20 mg/dL   Creatinine, Ser 1.56 (H) 0.44 - 1.00 mg/dL   Calcium 9.8 8.9 - 10.3 mg/dL   GFR, Estimated 39 (L) >60 mL/min    Comment: (NOTE) Calculated using the CKD-EPI Creatinine Equation (2021)    Anion gap 4 (L) 5 - 15    Comment: Performed at [facility], [place] [address] [place], [place] [address]  T4, free     Status: None   Collection Time: 12/02/21  6:24 AM  Result Value Ref Range   Free T4 0.84 0.61 - 1.12 ng/dL    Comment: (NOTE) Biotin ingestion may interfere with free T4 tests. If the results are inconsistent with the TSH level, previous test results, or the clinical presentation, then consider biotin interference. If needed, order repeat testing after stopping biotin. Performed at [facility], [place] [address], [place], [place] [address]   TSH     Status: Abnormal   Collection Time: 12/02/21  6:24 AM  Result Value Ref Range   TSH 6.397 (H) 0.350 - 4.500 uIU/mL    Comment: Performed by a 3rd Generation assay with a functional sensitivity of <=0.01 uIU/mL. Performed at [facility], [place] [address], [place], [place] [address]     Blood Alcohol level:  Lab Results   Component Value Date   [facility] <10 11/22/2021   ETH <10 71/69/6789    Metabolic Disorder Labs: Lab Results  Component Value Date   HGBA1C 6.1 (H) 11/24/2021   MPG 128.37 11/24/2021   MPG 119.76 03/28/2021   Lab Results  Component Value Date   PROLACTIN 11.0 03/14/2019   Lab Results  Component Value Date   CHOL 127 11/24/2021   TRIG 161 (H) 11/24/2021   HDL 39 (L) 11/24/2021   CHOLHDL 3.3 11/24/2021   VLDL 32 11/24/2021   LDLCALC 56 11/24/2021   LDLCALC 95 10/26/2020    Physical Findings: AIMS: Facial and Oral Movements Muscles of Facial Expression: None, normal Lips and Perioral Area: None, normal Jaw: None, normal Tongue: None, normal,Extremity Movements Upper (arms, wrists, hands, fingers): None, normal Lower (legs, knees, ankles, toes): None, normal, Trunk Movements Neck, shoulders, hips: None, normal, Overall Severity Severity of abnormal movements (highest score from questions above): None, normal Incapacitation due to abnormal movements: None, normal Patient's awareness of abnormal movements (rate only patient's report): No Awareness, Dental Status Current problems with teeth and/or dentures?: No Does patient usually wear dentures?: No  CIWA:    COWS:     Musculoskeletal: Strength & Muscle Tone: within normal limits Gait & Station: normal Patient leans: N/A  Psychiatric Specialty Exam:  Presentation  General Appearance: Casual (in psych scrubs, closthes are dirty, but she did not want to tell staff)  Eye Contact:Fleeting (looks very suspiciously at people today)  Speech:Pressured  Speech Volume:Increased  Handedness:Right   Mood and  Affect  Mood:Labile; Irritable  Affect:Congruent   Thought Process  Thought Processes:Disorganized  Descriptions of Associations:Loose  Orientation:Partial (New she was in  but was not able to recall [facility] on her own, otherwise AO to person and time)  Thought Content:Delusions; Paranoid  Ideation  History of Schizophrenia/Schizoaffective disorder:Yes  Duration of Psychotic Symptoms:Greater than six months  Hallucinations:Hallucinations: Auditory  Ideas of Reference:Delusions; Paranoia  Suicidal Thoughts:Suicidal Thoughts: No  Homicidal Thoughts:Homicidal Thoughts: No   Sensorium  Memory:Immediate Poor; Recent Poor  Judgment:Impaired  Insight:None   Executive Functions  Concentration:Poor  Attention Span:Poor  Recall:Poor  Fund of Knowledge:Poor  Language:Poor   Psychomotor Activity  Psychomotor Activity:Psychomotor Activity: Restlessness   Assets  Assets:Resilience   Sleep  Sleep:Sleep: Poor    Physical Exam: Physical Exam Constitutional:      General: She is not in acute distress. Pulmonary:     Effort: Pulmonary effort is normal. No respiratory distress.  Neurological:     Mental Status: She is alert.     Motor: No weakness.     Gait: Gait normal.   Review of Systems  Constitutional:  Negative for chills and fever.  Cardiovascular:  Negative for chest pain and palpitations.  Neurological:  Negative for dizziness, tremors and headaches.  Psychiatric/Behavioral:  Positive for hallucinations. Negative for suicidal ideas.   All other systems reviewed and are negative.  Blood pressure (!) 109/57, pulse 83, temperature (!) 97.4 F (36.3 C), temperature source Oral, resp. rate 18, height 5' (1.524 m), weight 76.7 kg, SpO2 100 %. Body mass index is 33.01 kg/m.   Treatment Plan Summary: Daily contact with patient to assess and evaluate symptoms and progress in treatment and Medication management Jasilyn is a 56 yo patient w/ PPH of schizophrenia.Patient suddenly became non complaint with her medications last night and missed the dosages. Patient appeared significantly worse today and was very paranoid, loud, disorganized, and more concerning for dangerous behavior. Patient did not do as well on orientation questions today and was not  really able to complete a single sentence today. Patient was noted to be far more restless as well. Patient was notably less pleasant today and more likely to have paranoid interactions with  staff and patients. Schizoaffective Disorder- Bipolar type:  IVC first eval completed 12-02-21. IVC served to pt 12-02-21. 24 hour eval completed 12-02-21.  FORCED MED ORDER Started 1/5  - 1/12!!!! - Geodon 80mg  BID PO or 20mg  IM if refuses PO - Zyprexa 5mg  PO QHS or 5mg  IM if refuses PO  Regular scheduled meds -Continue Geodon 80 mg BID -Continue Restoril 30 mg QHS -Continue Depakote 1250mg  (first dose accepted by pt on 12-01-21 - VA level needed on 12-06-21) -Increase Zyprexa form 5mg  QHS to 5 mg bid -Continue Amantadine 100mg  BID, EPS prophylaxis   HTN   Tachycardia: -REDUCE Propanolol from 20 mg TID to 10 mg tid (due to low BP)  -Continue to hold Losartan 25 mg daily   AKI: -Cr and BUN worse today. Will push PO fluids, re-check in morning, and recommend consult hospitalist if not trending better -Daily BMP -Encourage PO fluid intake: Gatorade and H2O     Hyperthyroidism: Spoke with hospitalist (1/3)  -Re-start methimazole at 5 mg bid  -TSH (12/29)- 6.847>>> 12.328 (1/3)>>>6.397 (12-02-21) -Free T4 0.84 wnl (12-02-21)  -Follow up with PCP for further management   Other PRNs:  -Continue Protonix 20 mg daily -Continue PRN's: Tylenol, Maalox, Atarax, Milk of Magnesia, Trazodone   PGY-2 Christoper Allegra, MD 12/02/2021, 12:25  PM   Total Time Spent in Direct Patient Care:  I personally spent 30 minutes on the unit in direct patient care. The direct patient care time included face-to-face time with the patient, reviewing the patient's chart, communicating with other professionals, and coordinating care. Greater than 50% of this time was spent in counseling or coordinating care with the patient regarding goals of hospitalization, psycho-education, and discharge planning needs.   Janine Limbo,  MD Psychiatrist

## 2021-12-03 DIAGNOSIS — F250 Schizoaffective disorder, bipolar type: Secondary | ICD-10-CM | POA: Diagnosis not present

## 2021-12-03 LAB — BASIC METABOLIC PANEL
Anion gap: 9 (ref 5–15)
BUN: 19 mg/dL (ref 6–20)
CO2: 23 mmol/L (ref 22–32)
Calcium: 9.7 mg/dL (ref 8.9–10.3)
Chloride: 106 mmol/L (ref 98–111)
Creatinine, Ser: 1.34 mg/dL — ABNORMAL HIGH (ref 0.44–1.00)
GFR, Estimated: 46 mL/min — ABNORMAL LOW (ref >60)
Glucose, Bld: 95 mg/dL (ref 70–99)
Potassium: 4.2 mmol/L (ref 3.5–5.1)
Sodium: 138 mmol/L (ref 135–145)

## 2021-12-03 NOTE — Progress Notes (Signed)
Pt constantly coming out her room requesting various things, when pt told no , pt becomes upset and does not listen to staff. Pt continues to drink water, pt educated on balancing fluids to keep electrolytes with in proper range.

## 2021-12-03 NOTE — [facility] Group Notes (Signed)
Patient did not attend group.   Spirituality group facilitated by Kathrynn Humble, [place].   Group Description: Group focused on topic of hope. Patients participated in facilitated discussion around topic, connecting with one another around experiences and definitions for hope. Group members engaged with visual explorer photos, reflecting on what hope looks like for them today. Group engaged in discussion around how their definitions of hope are present today in hospital.   Modalities: Psycho-social ed, Adlerian, Narrative, MI

## 2021-12-03 NOTE — Progress Notes (Signed)
Patient said she tune stuff out. She has nothing to say.

## 2021-12-03 NOTE — Progress Notes (Signed)
°   12/03/21 1500  Psych Admission Type (Psych Patients Only)  Admission Status Voluntary  Psychosocial Assessment  Patient Complaints Worrying  Eye Contact Fair  Facial Expression Animated;Anxious;Pensive  Affect Anxious;Preoccupied;Silly  Speech Incoherent;Pressured;Tangential  Interaction Assertive;Attention-seeking  Motor Activity Slow  Appearance/Hygiene Unremarkable  Behavior Characteristics Impulsive  Mood Preoccupied  Thought Process  Coherency Disorganized;Flight of ideas;Tangential  Content Preoccupation  Delusions None reported or observed  Perception Hallucinations  Hallucination Auditory;Visual  Judgment Poor  Confusion Moderate  Danger to Self  Current suicidal ideation? Denies  Danger to Others  Danger to Others None reported or observed

## 2021-12-03 NOTE — Group Note (Signed)
°  B[facility] LCSW Group Therapy Note  Date/Time:  12/03/2021 11:15AM-12:00PM  Type of Therapy and Topic:  Group Therapy:  Feelings About Hospitalization  Participation Level:  Minimal   Description of Group This process group involved patients discussing their feelings related to being hospitalized, as well as the benefits they see to being in the hospital.  These feelings and benefits were itemized.  The group then brainstormed specific ways in which they could seek those same benefits when they discharge and return home.  Therapeutic Goals Patient will identify and describe positive and negative feelings related to hospitalization Patient will verbalize benefits of hospitalization to themselves personally Patients will brainstorm together ways they can obtain similar benefits in the outpatient setting, identify barriers to wellness and possible solutions  Summary of Patient Progress:  The patient expressed her primary feelings about being hospitalized are that "most of the staff are good."  She was off topic and intrusive whenever she spoke, but in between those times she would leave the room.  She became angry at one point, laughed at other times.  She talked about not wanting to be put in a straight jacket, could not tolerate CSW pointing out that this is never going to happen in this hospital.  She talked repeatedly about how her family is important to her.  Therapeutic Modalities Cognitive Behavioral Therapy Motivational Interviewing    Selmer Dominion, LCSW 12/03/2021, 1:44 PM

## 2021-12-03 NOTE — [facility] Group Notes (Signed)
Goals Group 12/03/21    Group Focus: affirmation, clarity of thought, and goals/reality orientation Treatment Modality:  Psychoeducation Interventions utilized were assignment, group exercise, and support Purpose: To be able to understand and verbalize the reason for their admission to the hospital. To understand that the medication helps with their chemical imbalance but they also need to work on their choices in life. To be challenged to develop a list of 30 positives about themselves. Also introduce the concept that "feelings" are not reality.  Participation Level:  Paula Dickson came into the room and stayed for a few minutes and then left.  [name]
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
## 2021-12-03 NOTE — Progress Notes (Signed)
°   12/03/21 0558  Sleep  Number of Hours 6

## 2021-12-03 NOTE — Progress Notes (Signed)
Pt was encouraged but didn't attend orientation/goals group.

## 2021-12-03 NOTE — Progress Notes (Addendum)
[facility] MD Progress Note  12/03/2021 2:06 PM Paula Dickson  MRN:  [id number] Subjective:  Paula Dickson is a 56 yr old female who presents with bizarre behaviors and nonsensical speech after medication noncompliance.  PPHx is significant for Schizoaffective Disorder- Bipolar type complicated by medication noncompliance and previous suicide attempt (OD in 2018), multiple hospitalizations (latest 11/21 [facility]).   Case was discussed in the multidisciplinary team.  MAR was reviewed and patient was compliant with medications.  Patient slept 6 hours last night.  Psychiatry team made following the recommendation yesterday. IVC first eval completed 12-02-21. IVC served to pt 12-02-21. 24 hour eval completed 12-02-21.  FORCED MED ORDER Started 1/5  - 1/12 - Geodon 80mg  BID PO or 20mg  IM if refuses PO - Zyprexa 5mg  PO QHS or 5mg  IM if refuses PO  -Continue Geodon 80 mg BID -Continue Restoril 30 mg QHS -Continue Depakote 1250mg  (first dose accepted by pt on 12-01-21 - VA level needed on 12-06-21) -Increase Zyprexa form 5mg  QHS to 5 mg bid -Continue Amantadine 100mg  BID, EPS prophylaxis -REDUCE Propanolol from 20 mg TID to 10 mg tid (due to low BP)  -Continue to hold Losartan 25 mg daily   Evaluation on the unit today- she remains very psychotic, disorganized, illogical, irrational and paranoid.   She denies suicidal ideation, and homicidal ideation.  She states she hears voices from on high (god)  through a middle man and hears "I got to go".  She states she is seeing things but then changed topic and started screaming. Patient is paranoid states "somebody is taking my money".  She denies ideas of reference, thought insertion or withdrawal.  Her affect is extremely labile and within seconds she becomes irritable and starts screaming  and then  she put her hand on her eyes seems like she is crying and then talks normally. She appears to be responding to internal stimuli. She is overall a poor historian due to severity  of psychiatric illness.  She states that she has been compliant with her medications.  She states she does not like to take medications but she takes it so that she does not get any injectables. She states she has been drinking a lot of fluids.  She reports some headache and  constipation.  Principal Problem: Schizoaffective disorder, bipolar type ([place]) Diagnosis: Principal Problem:   Schizoaffective disorder, bipolar type ([place]) Active Problems:   Hyperthyroidism   Benign essential HTN   AKI (acute kidney injury) ([place])   Cognitive dysfunction in chronic schizophrenia ([place])  Total Time spent with patient: 25 minutes  Past Psychiatric History:  See H&P  Past Medical History:  Past Medical History:  Diagnosis Date   Bipolar affective disorder ([place])    Bipolar disorder ([place])    History of arthritis    History of chicken pox    History of depression    History of genital warts    history of heart murmur    History of high blood pressure    History of thyroid disease    History of UTI    Hypertension    Low TSH level 07/13/2017   Schizophrenia ([place])     Past Surgical History:  Procedure Laterality Date   ABLATION ON ENDOMETRIOSIS     CYST REMOVAL NECK     around 11 years ago /benign   MULTIPLE TOOTH EXTRACTIONS     Family History:  Family History  Problem Relation Age of Onset   Arthritis Father    Hyperlipidemia  Father    High blood pressure Father    Diabetes Sister    Diabetes Mother    Diabetes Brother    Mental illness Brother    Alcohol abuse Paternal Uncle    Alcohol abuse Paternal Grandfather    Breast cancer Maternal Aunt    Breast cancer Paternal Aunt    High blood pressure Sister    Mental illness Other        runs in family   Family Psychiatric  History: See H&P Social History:  Social History   Substance and Sexual Activity  Alcohol Use Not Currently     Social History   Substance and Sexual Activity  Drug Use Not Currently    Social History    Socioeconomic History   Marital status: Single    Spouse name: Not on file   Number of children: Not on file   Years of education: Not on file   Highest education level: Patient refused  Occupational History   Not on file  Tobacco Use   Smoking status: Never   Smokeless tobacco: Never  Vaping Use   Vaping Use: Never used  Substance and Sexual Activity   Alcohol use: Not Currently   Drug use: Not Currently   Sexual activity: Not Currently  Other Topics Concern   Not on file  Social History Narrative   ** Merged History Encounter **       Social Determinants of Health   Financial Resource Strain: Not on file  Food Insecurity: Not on file  Transportation Needs: Not on file  Physical Activity: Not on file  Stress: Not on file  Social Connections: Not on file   Sleep: Fair 6 hours  Appetite:  Good  Current Medications: Current Facility-Administered Medications  Medication Dose Route Frequency Provider Last Rate Last Admin   acetaminophen (TYLENOL) tablet 650 mg  650 mg Oral Q6H PRN Ethelene Hal, NP   650 mg at 11/27/21 2347   alum & mag hydroxide-simeth (MAALOX/MYLANTA) 200-200-20 MG/5ML suspension 30 mL  30 mL Oral Q4H PRN Ethelene Hal, NP       amantadine (SYMMETREL) capsule 100 mg  100 mg Oral BID Damita Dunnings B, MD   100 mg at 12/03/21 0819   divalproex (DEPAKOTE ER) 24 hr tablet 1,250 mg  1,250 mg Oral Q2000 Damita Dunnings B, MD   1,250 mg at 12/02/21 2031   magnesium hydroxide (MILK OF MAGNESIA) suspension 30 mL  30 mL Oral Daily PRN Ethelene Hal, NP       melatonin tablet 5 mg  5 mg Oral QHS Hill, Jackie Plum, MD   5 mg at 12/02/21 2031   methimazole (TAPAZOLE) tablet 5 mg  5 mg Oral BID Massengill, Ovid Curd, MD   5 mg at 12/03/21 0821   OLANZapine (ZYPREXA) tablet 5 mg  5 mg Oral BID Massengill, Ovid Curd, MD   5 mg at 12/03/21 1093   Or   OLANZapine (ZYPREXA) injection 5 mg  5 mg Intramuscular BID Massengill, Ovid Curd, MD        OLANZapine zydis (ZYPREXA) disintegrating tablet 5 mg  5 mg Oral Q8H PRN Massengill, Ovid Curd, MD   5 mg at 12/01/21 1203   And   ziprasidone (GEODON) injection 20 mg  20 mg Intramuscular PRN Massengill, Ovid Curd, MD       pantoprazole (PROTONIX) EC tablet 20 mg  20 mg Oral Daily Rosezetta Schlatter, MD   20 mg at 12/03/21 0819   propranolol (INDERAL) tablet  20 mg  20 mg Oral TID Janine Limbo, MD   20 mg at 12/03/21 0818   temazepam (RESTORIL) capsule 30 mg  30 mg Oral QHS Hill, Jackie Plum, MD   30 mg at 12/02/21 2031   white petrolatum (VASELINE) gel   Topical PRN Rosezetta Schlatter, MD   1 application at 93/81/82 2338   ziprasidone (GEODON) capsule 80 mg  80 mg Oral BID WC Massengill, Ovid Curd, MD   80 mg at 12/03/21 0818   Or   ziprasidone (GEODON) injection 20 mg  20 mg Intramuscular BID WC Massengill, Ovid Curd, MD        Lab Results:  Results for orders placed or performed during the hospital encounter of 11/23/21 (from the past 48 hour(s))  Basic metabolic panel     Status: Abnormal   Collection Time: 12/02/21  6:24 AM  Result Value Ref Range   Sodium 140 135 - 145 mmol/L   Potassium 4.1 3.5 - 5.1 mmol/L   Chloride 109 98 - 111 mmol/L   CO2 27 22 - 32 mmol/L   Glucose, Bld 103 (H) 70 - 99 mg/dL    Comment: Glucose reference range applies only to samples taken after fasting for at least 8 hours.   BUN 21 (H) 6 - 20 mg/dL   Creatinine, Ser 1.56 (H) 0.44 - 1.00 mg/dL   Calcium 9.8 8.9 - 10.3 mg/dL   GFR, Estimated 39 (L) >60 mL/min    Comment: (NOTE) Calculated using the CKD-EPI Creatinine Equation (2021)    Anion gap 4 (L) 5 - 15    Comment: Performed at [facility], [place] [address], [place], [place] [address]  T4, free     Status: None   Collection Time: 12/02/21  6:24 AM  Result Value Ref Range   Free T4 0.84 0.61 - 1.12 ng/dL    Comment: (NOTE) Biotin ingestion may interfere with free T4 tests. If the results are inconsistent with the TSH level, previous  test results, or the clinical presentation, then consider biotin interference. If needed, order repeat testing after stopping biotin. Performed at [facility], [place] [address] [place], [place] [address]   TSH     Status: Abnormal   Collection Time: 12/02/21  6:24 AM  Result Value Ref Range   TSH 6.397 (H) 0.350 - 4.500 uIU/mL    Comment: Performed by a 3rd Generation assay with a functional sensitivity of <=0.01 uIU/mL. Performed at [facility], [place] [address] [place], [place] [address]   Basic metabolic panel     Status: Abnormal   Collection Time: 12/03/21  6:48 AM  Result Value Ref Range   Sodium 138 135 - 145 mmol/L   Potassium 4.2 3.5 - 5.1 mmol/L   Chloride 106 98 - 111 mmol/L   CO2 23 22 - 32 mmol/L   Glucose, Bld 95 70 - 99 mg/dL    Comment: Glucose reference range applies only to samples taken after fasting for at least 8 hours.   BUN 19 6 - 20 mg/dL   Creatinine, Ser 1.34 (H) 0.44 - 1.00 mg/dL   Calcium 9.7 8.9 - 10.3 mg/dL   GFR, Estimated 46 (L) >60 mL/min    Comment: (NOTE) Calculated using the CKD-EPI Creatinine Equation (2021)    Anion gap 9 5 - 15    Comment: Performed at [facility], [place] [address], [place], [place] [address]    Blood Alcohol level:  Lab Results  Component Value Date   ETH <10 11/22/2021   ETH <10 93/81/8299    Metabolic Disorder Labs: Lab Results  Component Value Date   HGBA1C 6.1 (H) 11/24/2021   MPG 128.37 11/24/2021   MPG 119.76 03/28/2021   Lab Results  Component Value Date   PROLACTIN 11.0 03/14/2019   Lab Results  Component Value Date   CHOL 127 11/24/2021   TRIG 161 (H) 11/24/2021   HDL 39 (L) 11/24/2021   CHOLHDL 3.3 11/24/2021   VLDL 32 11/24/2021   LDLCALC 56 11/24/2021   LDLCALC 95 10/26/2020    Physical Findings: AIMS: Facial and Oral Movements Muscles of Facial Expression: None, normal Lips and Perioral Area: None, normal Jaw: None, normal Tongue: None,  normal,Extremity Movements Upper (arms, wrists, hands, fingers): None, normal Lower (legs, knees, ankles, toes): None, normal, Trunk Movements Neck, shoulders, hips: None, normal, Overall Severity Severity of abnormal movements (highest score from questions above): None, normal Incapacitation due to abnormal movements: None, normal Patient's awareness of abnormal movements (rate only patient's report): No Awareness, Dental Status Current problems with teeth and/or dentures?: No Does patient usually wear dentures?: No   Musculoskeletal: Strength & Muscle Tone: within normal limits Gait & Station: normal Patient leans: N/A  Psychiatric Specialty Exam:  Presentation  General Appearance: Casually dressed, fair hygiene  Eye Contact:Fleeting  Speech:Rambling, coherent  Speech Volume:Increased  Handedness:Right   Mood and Affect  Mood:Labile; Irritable  Affect:Congruent; Labile   Thought Process  Thought Processes:Disorganized  Descriptions of Associations:Loose  Orientation:Oriented to month, year and city not situation  Thought Content:Persecutory delusions, AVH, appears to be responding to internal stimuli at times on exam; paranoid appearing; denies ideas of reference or thought insertion/withdrawal.   History of Schizophrenia/Schizoaffective disorder:Yes  Duration of Psychotic Symptoms:Greater than six months  Hallucinations:AH of God and VH but will not clarify   Ideas of Reference:Denied  Suicidal Thoughts:Suicidal Thoughts: No   Homicidal Thoughts:Homicidal Thoughts: No    Sensorium  Memory:Immediate Poor; Recent Poor  Judgment:Impaired  Insight:Lacking   Executive Functions  Concentration:Poor  Attention Span:Poor  Recall:Poor  Fund of Knowledge:Poor  Language:Fair   Psychomotor Activity  Psychomotor Activity:Pacing at times on unit - animated when speaking   Assets  Assets:Resilience   Sleep  Sleep:Sleep: Fair Number of  Hours of Sleep: 6   Physical Exam Vitals reviewed.  Constitutional:      General: She is not in acute distress. HENT:     Head: Normocephalic.  Pulmonary:     Effort: Pulmonary effort is normal. No respiratory distress.  Neurological:     General: No focal deficit present.     Mental Status: She is alert.   Review of Systems  Constitutional:  Negative for chills and fever.  Cardiovascular:  Negative for chest pain and palpitations.  Gastrointestinal:  Positive for constipation.  Neurological:  Positive for headaches. Negative for dizziness and tremors.  Psychiatric/Behavioral:  Positive for hallucinations. Negative for suicidal ideas.   All other systems reviewed and are negative.  Blood pressure 132/90, pulse 93, temperature 97.8 F (36.6 C), temperature source Oral, resp. rate 18, height 5' (1.524 m), weight 76.7 kg, SpO2 100 %. Body mass index is 33.01 kg/m.   Treatment Plan Summary: Daily contact with patient to assess and evaluate symptoms and progress in treatment and Medication management  New labs: BMP shows creatinine improved today 1.34 from 1.56.  BUN 19, GFR 46  Schizoaffective Disorder- Bipolar type: IVC first eval completed 12-02-21. IVC served to pt 12-02-21.  24 hour eval completed 12-02-21.  FORCED MED ORDER Started 1/5  - 1/12!!!! - Geodon 80mg  BID PO or 20mg  IM if refuses PO - Zyprexa 5mg  PO QHS or 5mg  IM if refuses PO  Regular scheduled meds -Continue Geodon 80 mg BID -Continue Restoril 30 mg QHS -Continue Depakote 1250mg  (first dose accepted by pt on 12-01-21 - VA level needed on 12-06-21) -Continue Zyprexa  5 mg bid -Continue Amantadine 100mg  BID, EPS prophylaxis   HTN   Tachycardia: -Continue propanolol 10 mg tid  -Continue to hold Losartan 25 mg daily   AKI: -Cr and BUN improved today as compared to yesterday. Will push PO fluids, re-check in morning again -Daily BMP -Encourage PO fluid intake: Gatorade and H2O   Hyperthyroidism: Spoke with  hospitalist (1/3)  -Re-start methimazole at 5 mg bid  -TSH (12/29)- 6.847>>> 12.328 (1/3)>>>6.397 (12-02-21) -Free T4 0.84 wnl (12-02-21)  -Follow up with PCP for further management   Other PRNs:  -Continue Protonix 20 mg daily -Continue PRN's: Tylenol, Maalox, Atarax, Milk of Magnesia, Trazodone   PGY-2 Armando Reichert, MD 12/03/2021, 2:06 PM

## 2021-12-04 DIAGNOSIS — F250 Schizoaffective disorder, bipolar type: Secondary | ICD-10-CM | POA: Diagnosis not present

## 2021-12-04 LAB — BASIC METABOLIC PANEL
Anion gap: 6 (ref 5–15)
BUN: 21 mg/dL — ABNORMAL HIGH (ref 6–20)
CO2: 26 mmol/L (ref 22–32)
Calcium: 9.7 mg/dL (ref 8.9–10.3)
Chloride: 107 mmol/L (ref 98–111)
Creatinine, Ser: 1.53 mg/dL — ABNORMAL HIGH (ref 0.44–1.00)
GFR, Estimated: 39 mL/min — ABNORMAL LOW (ref >60)
Glucose, Bld: 92 mg/dL (ref 70–99)
Potassium: 4 mmol/L (ref 3.5–5.1)
Sodium: 139 mmol/L (ref 135–145)

## 2021-12-04 MED ORDER — WHITE PETROLATUM EX OINT
TOPICAL_OINTMENT | CUTANEOUS | Status: AC
  Filled 2021-12-04: qty 5

## 2021-12-04 NOTE — [facility] Group Notes (Signed)
Adult Psychoeducational Group Not Date:  12/04/2021 Time:  0900-1045 Group Topic/Focus: PROGRESSIVE RELAXATION. A group where deep breathing is taught and tensing and relaxation muscle groups is used. Imagery is used as well.  Pts are asked to imagine 3 pillars that hold them up when they are not able to hold themselves up.  Participation Level: did not attend Paula Dickson
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
## 2021-12-04 NOTE — Progress Notes (Signed)
Patient denies SI, HI and AVH this shift. Patient attended groups was compliant with medications.  Patient had no incidents of behavioral dyscontrol this shift.  Monitor patient for safety, offer medications as prescribed, engage patient 1:1 staff talks.  Patient able to contract for safety. Continue to monitor as planned.

## 2021-12-04 NOTE — Progress Notes (Signed)
Pt was encouraged but didn't attend orientation/goals group.

## 2021-12-04 NOTE — Progress Notes (Addendum)
[facility] MD Progress Note  12/04/2021 4:08 PM Paula Dickson  MRN:  [id number] Subjective:  Paula Dickson is a 56 yr old female who presents with bizarre behaviors and nonsensical speech after medication noncompliance.  PPHx is significant for Schizoaffective Disorder- Bipolar type complicated by medication noncompliance and previous suicide attempt (OD in 2018), multiple hospitalizations (latest 11/21 [facility]).   Case was discussed in the multidisciplinary team.  MAR was reviewed and patient was compliant with medications.  Sleep hours not charted.  Psychiatry team made following the recommendation yesterday. IVC first eval completed 12-02-21. IVC served to pt 12-02-21. 24 hour eval completed 12-02-21.  FORCED MED ORDER Started 1/5  - 1/12!!!! - Geodon 80mg  BID PO or 20mg  IM if refuses PO - Zyprexa 5mg  PO QHS or 5mg  IM if refuses PO  -Continue Geodon 80 mg BID -Continue Restoril 30 mg QHS -Continue Depakote 1250mg  (first dose accepted by pt on 12-01-21 - VA level needed on 12-06-21) -Continue Zyprexa  5 mg bid -Continue Amantadine 100mg  BID, EPS prophylaxis -Continue propanolol 10 mg tid  -Continue to hold Losartan 25 mg daily  Evaluation on the unit today- She remains very psychotic, disorganized, illogical, irrational and paranoid.   She states she does not talk to " Paula Dickson".  She states this is not a hospital but is a "resting place."She then started putting shorts on under her dress and then took off the short and threw the shorts on the bed, stating "I will be exposed, and you will say my Ass going into porn".  She endorses visual hallucinations of seeing "little boy drawing colors" and states "I see good and bad in people".  She is paranoid stating "people are taking my money".  She screams " I am sad and you are giving me medications which I do not want ". Patient started screaming again, states" stop asking me same questions", and then asks providers " just tell me who do you serve?".  When asked about  if she is getting messages from TV or electronic devices. She states "it depends". She is overall a poor historian due to severity of psychiatric illness.  She  has been compliant with her medications.   She denies any suicidal ideation and homicidal ideation. Discussed that the social worker will talk to her about ACT services.  She states "you do not have to take care of me I have social workers working for me".  She appears to be responding to internal stimuli. She reports some constipation today but denies any other physical symptoms.  She has been sleeping and eating well.  Principal Problem: Schizoaffective disorder, bipolar type ([place]) Diagnosis: Principal Problem:   Schizoaffective disorder, bipolar type ([place]) Active Problems:   Hyperthyroidism   Benign essential HTN   AKI (acute kidney injury) ([place])   Cognitive dysfunction in chronic schizophrenia ([place])  Total Time Spent in Direct Patient Care:  I personally spent 25 minutes on the unit in direct patient care. The direct patient care time included face-to-face time with the patient, reviewing the patient's chart, communicating with other professionals, and coordinating care. Greater than 50% of this time was spent in counseling or coordinating care with the patient regarding goals of hospitalization, psycho-education, and discharge planning needs.   Past Psychiatric History:  See H&P  Past Medical History:  Past Medical History:  Diagnosis Date   Bipolar affective disorder ([place])    Bipolar disorder ([place])    History of arthritis    History of chicken pox  History of depression    History of genital warts    history of heart murmur    History of high blood pressure    History of thyroid disease    History of UTI    Hypertension    Low TSH level 07/13/2017   Schizophrenia ([place])     Past Surgical History:  Procedure Laterality Date   ABLATION ON ENDOMETRIOSIS     CYST REMOVAL NECK     around 11 years ago /benign   MULTIPLE  TOOTH EXTRACTIONS     Family History:  Family History  Problem Relation Age of Onset   Arthritis Father    Hyperlipidemia Father    High blood pressure Father    Diabetes Sister    Diabetes Mother    Diabetes Brother    Mental illness Brother    Alcohol abuse Paternal Uncle    Alcohol abuse Paternal Grandfather    Breast cancer Maternal Aunt    Breast cancer Paternal Aunt    High blood pressure Sister    Mental illness Other        runs in family   Family Psychiatric  History: See H&P  Social History:  Social History   Substance and Sexual Activity  Alcohol Use Not Currently     Social History   Substance and Sexual Activity  Drug Use Not Currently    Social History   Socioeconomic History   Marital status: Single    Spouse name: Not on file   Number of children: Not on file   Years of education: Not on file   Highest education level: Patient refused  Occupational History   Not on file  Tobacco Use   Smoking status: Never   Smokeless tobacco: Never  Vaping Use   Vaping Use: Never used  Substance and Sexual Activity   Alcohol use: Not Currently   Drug use: Not Currently   Sexual activity: Not Currently  Other Topics Concern   Not on file  Social History Narrative   ** Merged History Encounter **       Social Determinants of Health   Financial Resource Strain: Not on file  Food Insecurity: Not on file  Transportation Needs: Not on file  Physical Activity: Not on file  Stress: Not on file  Social Connections: Not on file   Sleep: Good   Appetite:  Good  Current Medications: Current Facility-Administered Medications  Medication Dose Route Frequency Provider Last Rate Last Admin   acetaminophen (TYLENOL) tablet 650 mg  650 mg Oral Q6H PRN Ethelene Hal, NP   650 mg at 11/27/21 2347   alum & mag hydroxide-simeth (MAALOX/MYLANTA) 200-200-20 MG/5ML suspension 30 mL  30 mL Oral Q4H PRN Ethelene Hal, NP       amantadine (SYMMETREL)  capsule 100 mg  100 mg Oral BID Damita Dunnings B, MD   100 mg at 12/04/21 0745   divalproex (DEPAKOTE ER) 24 hr tablet 1,250 mg  1,250 mg Oral Q2000 McQuilla, Joyce Gross B, MD   1,250 mg at 12/03/21 2041   magnesium hydroxide (MILK OF MAGNESIA) suspension 30 mL  30 mL Oral Daily PRN Ethelene Hal, NP       melatonin tablet 5 mg  5 mg Oral QHS Hill, Jackie Plum, MD   5 mg at 12/03/21 2043   methimazole (TAPAZOLE) tablet 5 mg  5 mg Oral BID Janine Limbo, MD   5 mg at 12/04/21 0747   OLANZapine (ZYPREXA) tablet 5  mg  5 mg Oral BID Janine Limbo, MD   5 mg at 12/04/21 0746   Or   OLANZapine (ZYPREXA) injection 5 mg  5 mg Intramuscular BID Massengill, Ovid Curd, MD       OLANZapine zydis (ZYPREXA) disintegrating tablet 5 mg  5 mg Oral Q8H PRN Massengill, Ovid Curd, MD   5 mg at 12/01/21 1203   And   ziprasidone (GEODON) injection 20 mg  20 mg Intramuscular PRN Massengill, Ovid Curd, MD       pantoprazole (PROTONIX) EC tablet 20 mg  20 mg Oral Daily Rosezetta Schlatter, MD   20 mg at 12/04/21 0746   propranolol (INDERAL) tablet 20 mg  20 mg Oral TID Janine Limbo, MD   20 mg at 12/04/21 1352   temazepam (RESTORIL) capsule 30 mg  30 mg Oral QHS Hill, Jackie Plum, MD   30 mg at 12/03/21 2042   white petrolatum (VASELINE) gel   Topical PRN Rosezetta Schlatter, MD   Given at 12/04/21 1516   ziprasidone (GEODON) capsule 80 mg  80 mg Oral BID WC Massengill, Ovid Curd, MD   80 mg at 12/04/21 0745   Or   ziprasidone (GEODON) injection 20 mg  20 mg Intramuscular BID WC Massengill, Ovid Curd, MD        Lab Results:  Results for orders placed or performed during the hospital encounter of 11/23/21 (from the past 48 hour(s))  Basic metabolic panel     Status: Abnormal   Collection Time: 12/03/21  6:48 AM  Result Value Ref Range   Sodium 138 135 - 145 mmol/L   Potassium 4.2 3.5 - 5.1 mmol/L   Chloride 106 98 - 111 mmol/L   CO2 23 22 - 32 mmol/L   Glucose, Bld 95 70 - 99 mg/dL    Comment: Glucose reference  range applies only to samples taken after fasting for at least 8 hours.   BUN 19 6 - 20 mg/dL   Creatinine, Ser 1.34 (H) 0.44 - 1.00 mg/dL   Calcium 9.7 8.9 - 10.3 mg/dL   GFR, Estimated 46 (L) >60 mL/min    Comment: (NOTE) Calculated using the CKD-EPI Creatinine Equation (2021)    Anion gap 9 5 - 15    Comment: Performed at [facility], [place] [address], [place], [place] [address]  Basic metabolic panel     Status: Abnormal   Collection Time: 12/04/21  6:51 AM  Result Value Ref Range   Sodium 139 135 - 145 mmol/L   Potassium 4.0 3.5 - 5.1 mmol/L   Chloride 107 98 - 111 mmol/L   CO2 26 22 - 32 mmol/L   Glucose, Bld 92 70 - 99 mg/dL    Comment: Glucose reference range applies only to samples taken after fasting for at least 8 hours.   BUN 21 (H) 6 - 20 mg/dL   Creatinine, Ser 1.53 (H) 0.44 - 1.00 mg/dL   Calcium 9.7 8.9 - 10.3 mg/dL   GFR, Estimated 39 (L) >60 mL/min    Comment: (NOTE) Calculated using the CKD-EPI Creatinine Equation (2021)    Anion gap 6 5 - 15    Comment: Performed at [facility], [place] [address], [place], [place] [address]    Blood Alcohol level:  Lab Results  Component Value Date   [facility] <10 11/22/2021   ETH <10 62/13/0865    Metabolic Disorder Labs: Lab Results  Component Value Date   HGBA1C 6.1 (H) 11/24/2021   MPG 128.37 11/24/2021  MPG 119.76 03/28/2021   Lab Results  Component Value Date   PROLACTIN 11.0 03/14/2019   Lab Results  Component Value Date   CHOL 127 11/24/2021   TRIG 161 (H) 11/24/2021   HDL 39 (L) 11/24/2021   CHOLHDL 3.3 11/24/2021   VLDL 32 11/24/2021   LDLCALC 56 11/24/2021   LDLCALC 95 10/26/2020    Physical Findings: AIMS: Facial and Oral Movements Muscles of Facial Expression: None, normal Lips and Perioral Area: None, normal Jaw: None, normal Tongue: None, normal,Extremity Movements Upper (arms, wrists, hands, fingers): None, normal Lower (legs, knees, ankles, toes):  None, normal, Trunk Movements Neck, shoulders, hips: None, normal, Overall Severity Severity of abnormal movements (highest score from questions above): None, normal Incapacitation due to abnormal movements: None, normal Patient's awareness of abnormal movements (rate only patient's report): No Awareness, Dental Status Current problems with teeth and/or dentures?: No Does patient usually wear dentures?: No   Musculoskeletal: Strength & Muscle Tone: within normal limits Gait & Station: normal Patient leans: N/A  Psychiatric Specialty Exam:  Presentation  General Appearance: Casually dressed, fair hygiene  Eye Contact:Fleeting  Speech:Rambling, coherent  Speech Volume:Increased  Handedness:Right   Mood and Affect  Mood:Irritable; Angry  Affect: irritable, labile   Thought Process  Thought Processes:Disorganized  Descriptions of Associations:Loose  Orientation:would not cooperate to answer questions  Thought Content:Persecutory delusions, AVH, appears to be responding to internal stimuli at times on exam; paranoid appearing; denies ideas of reference or thought insertion/withdrawal.   History of Schizophrenia/Schizoaffective disorder:Yes  Duration of Psychotic Symptoms:Greater than six months  Hallucinations:AH of God and VH of seeing a little boy drawing colors " sees good and bad in people".   Ideas of Reference:Denied  Suicidal Thoughts:Suicidal Thoughts: No   Homicidal Thoughts:Homicidal Thoughts: No    Sensorium  Memory:Immediate Poor; Recent Poor  Judgment:Impaired  Insight:Lacking   Executive Functions  Concentration:Poor  Attention Span:Poor  Recall:Poor  Fund of Knowledge:Poor  Language:Fair   Psychomotor Activity  Psychomotor Activity:Pacing at times on unit - animated when speaking   Assets  Assets:Resilience   Sleep  Number of Hours of Sleep: Not charted   Physical Exam Vitals reviewed.  Constitutional:      General:  She is not in acute distress. HENT:     Head: Normocephalic.  Pulmonary:     Effort: Pulmonary effort is normal. No respiratory distress.  Neurological:     General: No focal deficit present.     Mental Status: She is alert.   Review of Systems  Constitutional:  Negative for chills and fever.  Cardiovascular:  Negative for chest pain and palpitations.  Gastrointestinal:  Positive for constipation.  Neurological:  Negative for dizziness, tremors and headaches.  Psychiatric/Behavioral:  Positive for hallucinations. Negative for suicidal ideas.   All other systems reviewed and are negative.  Blood pressure 134/81, pulse 81, temperature 97.9 F (36.6 C), temperature source Oral, resp. rate 18, height 5' (1.524 m), weight 76.7 kg, SpO2 100 %. Body mass index is 33.01 kg/m.   Treatment Plan Summary: Daily contact with patient to assess and evaluate symptoms and progress in treatment and Medication management  New labs: BMP shows slight increase in creatinine 1.53, BUN 21, GFR 39  Schizoaffective Disorder- Bipolar type: IVC first eval completed 12-02-21. IVC served to pt 12-02-21. 24 hour eval completed 12-02-21.  FORCED MED ORDER Started 1/5  - 1/12!!!! - Geodon 80mg  BID PO or 20mg  IM if refuses PO - Zyprexa 5mg  PO QHS or 5mg  IM if  refuses PO  Regular scheduled meds -Continue Geodon 80 mg BID -Continue Restoril 30 mg QHS -Continue Depakote 1250mg  (first dose accepted by pt on 12-01-21 - VA level, CBC, hepatic function panel ordered for 12-05-21) -Continue Zyprexa  5 mg bid -Continue Amantadine 100mg  BID, EPS prophylaxis   HTN   Tachycardia: -Continue propanolol 10 mg tid  -Continue to hold Losartan 25 mg daily   AKI: -Cr and BUN slightly increased from yesterday. Will push PO fluids, re-check in morning again -Daily BMP -Encourage PO fluid intake: Gatorade and H2O   Hyperthyroidism: Spoke with hospitalist (1/3)  -Re-start methimazole at 5 mg bid  -TSH (12/29)- 6.847>>> 12.328  (1/3)>>>6.397 (12-02-21) -Free T4 0.84 wnl (12-02-21)  -Follow up with PCP for further management   Other PRNs:  -Continue Protonix 20 mg daily -Continue PRN's: Tylenol, Maalox, Atarax, Milk of Magnesia, Trazodone   PGY-2 Armando Reichert, MD 12/04/2021, 4:08 PM

## 2021-12-04 NOTE — Group Note (Signed)
°  [facility] LCSW Group Therapy Note  Date/Time:  12/04/2021 11:15AM-12:00PM  Type of Therapy and Topic:  Group Therapy:  Self-Care after Hospitalization  Participation Level:  Active   Description of Group This process group involved patients discussing how they plan to take care of themselves in a better manner when they get home from the hospital.  The group started with patients listing one healthy and one unhealthy way they took care of themselves prior to hospitalization.  A discussion ensued about the differences in healthy and unhealthy coping skills.  Group members shared ideas about making changes when they return home so that they can stay well and in recovery.  The white board was used to list ideas so that patients can continue to see these ideas throughout the day.  Therapeutic Goals Patient will identify and describe one healthy and one unhealthy coping technique used prior to hospitalization Patient will participate in generating ideas about healthy self-care options when they return to the community Patients will be supportive of one another and receive said support from others Patient will identify one healthy self-care activity to add to his/her post-hospitalization life that can help in recovery  Summary of Patient Progress:  The patient expressed that prior to hospitalization some healthy self-care activity she engaged in was exercise, while unhealthy self-care activity included "that's in the past that's why i'm in group"  Patient identified taking care of herself so she's clean as another self-care activity to add in her pursuit of recovery post-discharge.   Therapeutic Modalities Brief Solution-Focused Therapy Motivational Interviewing Psychoeducation      Read Drivers, Latanya Presser 12/04/2021  2:33 PM

## 2021-12-04 NOTE — Plan of Care (Signed)
  Problem: Education: Goal: Emotional status will improve Outcome: Not Progressing Goal: Mental status will improve Outcome: Not Progressing Goal: Verbalization of understanding the information provided will improve Outcome: Not Progressing   

## 2021-12-05 ENCOUNTER — Encounter (HOSPITAL_COMMUNITY): Payer: Self-pay

## 2021-12-05 DIAGNOSIS — F250 Schizoaffective disorder, bipolar type: Secondary | ICD-10-CM | POA: Diagnosis not present

## 2021-12-05 LAB — CBC WITH DIFFERENTIAL/PLATELET
Abs Immature Granulocytes: 0.01 10*3/uL (ref 0.00–0.07)
Basophils Absolute: 0 10*3/uL (ref 0.0–0.1)
Basophils Relative: 1 %
Eosinophils Absolute: 0.4 10*3/uL (ref 0.0–0.5)
Eosinophils Relative: 8 %
HCT: 38.4 % (ref 36.0–46.0)
Hemoglobin: 12.2 g/dL (ref 12.0–15.0)
Immature Granulocytes: 0 %
Lymphocytes Relative: 31 %
Lymphs Abs: 1.6 10*3/uL (ref 0.7–4.0)
MCH: 29 pg (ref 26.0–34.0)
MCHC: 31.8 g/dL (ref 30.0–36.0)
MCV: 91.2 fL (ref 80.0–100.0)
Monocytes Absolute: 0.5 10*3/uL (ref 0.1–1.0)
Monocytes Relative: 9 %
Neutro Abs: 2.8 10*3/uL (ref 1.7–7.7)
Neutrophils Relative %: 51 %
Platelets: 228 10*3/uL (ref 150–400)
RBC: 4.21 MIL/uL (ref 3.87–5.11)
RDW: 12.9 % (ref 11.5–15.5)
WBC: 5.3 10*3/uL (ref 4.0–10.5)
nRBC: 0 % (ref 0.0–0.2)

## 2021-12-05 LAB — BASIC METABOLIC PANEL WITH GFR
Anion gap: 9 (ref 5–15)
Anion gap: 9 (ref 5–15)
BUN: 25 mg/dL — ABNORMAL HIGH (ref 6–20)
BUN: 28 mg/dL — ABNORMAL HIGH (ref 6–20)
CO2: 26 mmol/L (ref 22–32)
CO2: 26 mmol/L (ref 22–32)
Calcium: 10.4 mg/dL — ABNORMAL HIGH (ref 8.9–10.3)
Calcium: 10.2 mg/dL (ref 8.9–10.3)
Chloride: 109 mmol/L (ref 98–111)
Chloride: 108 mmol/L (ref 98–111)
Creatinine, Ser: 1.42 mg/dL — ABNORMAL HIGH (ref 0.44–1.00)
Creatinine, Ser: 1.6 mg/dL — ABNORMAL HIGH (ref 0.44–1.00)
GFR, Estimated: 43 mL/min — ABNORMAL LOW
GFR, Estimated: 37 mL/min — ABNORMAL LOW
Glucose, Bld: 91 mg/dL (ref 70–99)
Glucose, Bld: 108 mg/dL — ABNORMAL HIGH (ref 70–99)
Potassium: 4.2 mmol/L (ref 3.5–5.1)
Potassium: 4.3 mmol/L (ref 3.5–5.1)
Sodium: 144 mmol/L (ref 135–145)
Sodium: 143 mmol/L (ref 135–145)

## 2021-12-05 LAB — CBC
HCT: 42.2 % (ref 36.0–46.0)
Hemoglobin: 13.3 g/dL (ref 12.0–15.0)
MCH: 28.7 pg (ref 26.0–34.0)
MCHC: 31.5 g/dL (ref 30.0–36.0)
MCV: 90.9 fL (ref 80.0–100.0)
Platelets: 240 10*3/uL (ref 150–400)
RBC: 4.64 MIL/uL (ref 3.87–5.11)
RDW: 13 % (ref 11.5–15.5)
WBC: 5.1 10*3/uL (ref 4.0–10.5)
nRBC: 0 % (ref 0.0–0.2)

## 2021-12-05 LAB — BRAIN NATRIURETIC PEPTIDE: B Natriuretic Peptide: 8.6 pg/mL (ref 0.0–100.0)

## 2021-12-05 LAB — HEPATIC FUNCTION PANEL
ALT: 14 U/L (ref 0–44)
AST: 12 U/L — ABNORMAL LOW (ref 15–41)
Albumin: 4.1 g/dL (ref 3.5–5.0)
Alkaline Phosphatase: 68 U/L (ref 38–126)
Bilirubin, Direct: 0.1 mg/dL (ref 0.0–0.2)
Indirect Bilirubin: 0.5 mg/dL (ref 0.3–0.9)
Total Bilirubin: 0.6 mg/dL (ref 0.3–1.2)
Total Protein: 7.5 g/dL (ref 6.5–8.1)

## 2021-12-05 LAB — TROPONIN I (HIGH SENSITIVITY): Troponin I (High Sensitivity): 3 ng/L

## 2021-12-05 LAB — VALPROIC ACID LEVEL: Valproic Acid Lvl: 117 ug/mL — ABNORMAL HIGH (ref 50.0–100.0)

## 2021-12-05 LAB — SEDIMENTATION RATE: Sed Rate: 8 mm/h (ref 0–22)

## 2021-12-05 LAB — TSH: TSH: 5.984 u[IU]/mL — ABNORMAL HIGH (ref 0.350–4.500)

## 2021-12-05 MED ORDER — DIVALPROEX SODIUM ER 500 MG PO TB24
750.0000 mg | ORAL_TABLET | Freq: Every day | ORAL | Status: DC
  Administered 2021-12-05 – 2021-12-09 (×3): 750 mg via ORAL
  Filled 2021-12-05 (×9): qty 1

## 2021-12-05 MED ORDER — PROPRANOLOL HCL ER 60 MG PO CP24
60.0000 mg | ORAL_CAPSULE | Freq: Every day | ORAL | Status: DC
  Administered 2021-12-06 – 2021-12-23 (×15): 60 mg via ORAL
  Filled 2021-12-05 (×20): qty 1
  Filled 2021-12-05: qty 7

## 2021-12-05 NOTE — Progress Notes (Deleted)
°   12/05/21 2200  Psych Admission Type (Psych Patients Only)  Admission Status Voluntary  Psychosocial Assessment  Patient Complaints Anxiety  Eye Contact Fair  Facial Expression Animated  Affect Euphoric  Speech Incoherent;Pressured;Rapid;Slurred;Word salad  Interaction Attention-seeking;Assertive;Childlike  Motor Activity Slow;Shuffling  Appearance/Hygiene Improved  Behavior Characteristics Anxious;Restless  Mood Silly;Preoccupied;Suspicious;Labile  Thought Process  Coherency Disorganized;Flight of ideas;Incoherent;Tangential  Content Religiosity  Delusions Religious  Perception Hallucinations  Hallucination Auditory;Visual  Judgment Poor  Confusion Moderate  Danger to Self  Current suicidal ideation? Denies  Danger to Others  Danger to Others None reported or observed

## 2021-12-05 NOTE — BH IP Treatment Plan (Signed)
Interdisciplinary Treatment and Diagnostic Plan Update  12/05/2021 Time of Session: 11;20am Ahlayah Tarkowski Foots MRN: [id number]  Principal Diagnosis: Schizoaffective disorder, bipolar type [facility])  Secondary Diagnoses: Principal Problem:   Schizoaffective disorder, bipolar type ([place]) Active Problems:   Hyperthyroidism   Benign essential HTN   AKI (acute kidney injury) ([place])   Cognitive dysfunction in chronic schizophrenia ([place])   Current Medications:  Current Facility-Administered Medications  Medication Dose Route Frequency Provider Last Rate Last Admin   acetaminophen (TYLENOL) tablet 650 mg  650 mg Oral Q6H PRN Ethelene Hal, NP   650 mg at 11/27/21 2347   alum & mag hydroxide-simeth (MAALOX/MYLANTA) 200-200-20 MG/5ML suspension 30 mL  30 mL Oral Q4H PRN Ethelene Hal, NP       amantadine (SYMMETREL) capsule 100 mg  100 mg Oral BID Damita Dunnings B, MD   100 mg at 12/05/21 6754   divalproex (DEPAKOTE ER) 24 hr tablet 750 mg  750 mg Oral Q2000 Damita Dunnings B, MD       magnesium hydroxide (MILK OF MAGNESIA) suspension 30 mL  30 mL Oral Daily PRN Ethelene Hal, NP       melatonin tablet 5 mg  5 mg Oral QHS Hill, Jackie Plum, MD   5 mg at 12/04/21 2107   methimazole (TAPAZOLE) tablet 5 mg  5 mg Oral BID Massengill, Ovid Curd, MD   5 mg at 12/05/21 0809   OLANZapine (ZYPREXA) tablet 5 mg  5 mg Oral BID Massengill, Ovid Curd, MD   5 mg at 12/05/21 4920   Or   OLANZapine (ZYPREXA) injection 5 mg  5 mg Intramuscular BID Massengill, Ovid Curd, MD       OLANZapine zydis (ZYPREXA) disintegrating tablet 5 mg  5 mg Oral Q8H PRN Massengill, Ovid Curd, MD   5 mg at 12/01/21 1203   And   ziprasidone (GEODON) injection 20 mg  20 mg Intramuscular PRN Massengill, Ovid Curd, MD       pantoprazole (PROTONIX) EC tablet 20 mg  20 mg Oral Daily Rosezetta Schlatter, MD   20 mg at 12/05/21 0809   propranolol (INDERAL) tablet 20 mg  20 mg Oral TID Janine Limbo, MD   20 mg at 12/05/21 0809    temazepam (RESTORIL) capsule 30 mg  30 mg Oral QHS Hill, Jackie Plum, MD   30 mg at 12/04/21 2107   white petrolatum (VASELINE) gel   Topical PRN Rosezetta Schlatter, MD   Given at 12/04/21 1516   ziprasidone (GEODON) capsule 80 mg  80 mg Oral BID WC Massengill, Ovid Curd, MD   80 mg at 12/05/21 1007   Or   ziprasidone (GEODON) injection 20 mg  20 mg Intramuscular BID WC Massengill, Ovid Curd, MD       PTA Medications: Medications Prior to Admission  Medication Sig Dispense Refill Last Dose   losartan (COZAAR) 25 MG tablet Take 1 tablet (25 mg total) by mouth daily. 30 tablet 0 Past Month   methimazole (TAPAZOLE) 10 MG tablet Take 1 tablet (10 mg total) by mouth 2 (two) times daily. 60 tablet 2 Past Month   OLANZapine (ZYPREXA) 10 MG tablet Take 10 mg by mouth at bedtime.   Past Month   pantoprazole (PROTONIX) 20 MG tablet Take 20 mg by mouth daily.   Past Month   propranolol (INDERAL) 20 MG tablet Take 1 tablet (20 mg total) by mouth 2 (two) times daily. (Patient taking differently: Take 10 mg by mouth 2 (two) times daily.) 60 tablet 0 Past Month  LORazepam (ATIVAN) 1 MG tablet Take 1 mg by mouth 3 (three) times daily.       Patient Stressors: Health problems   Medication change or noncompliance    Patient Strengths: Motivation for treatment/growth  Supportive family/friends   Treatment Modalities: Medication Management, Group therapy, Case management,  1 to 1 session with clinician, Psychoeducation, Recreational therapy.   Physician Treatment Plan for Primary Diagnosis: Schizoaffective disorder, bipolar type ([place]) Long Term Goal(s): Improvement in symptoms so as ready for discharge   Short Term Goals: Ability to identify changes in lifestyle to reduce recurrence of condition will improve Ability to verbalize feelings will improve Ability to demonstrate self-control will improve Ability to identify and develop effective coping behaviors will improve Ability to maintain clinical  measurements within normal limits will improve Compliance with prescribed medications will improve  Medication Management: Evaluate patient's response, side effects, and tolerance of medication regimen.  Therapeutic Interventions: 1 to 1 sessions, Unit Group sessions and Medication administration.  Evaluation of Outcomes: Not Met  Physician Treatment Plan for Secondary Diagnosis: Principal Problem:   Schizoaffective disorder, bipolar type ([place]) Active Problems:   Hyperthyroidism   Benign essential HTN   AKI (acute kidney injury) ([place])   Cognitive dysfunction in chronic schizophrenia ([place])  Long Term Goal(s): Improvement in symptoms so as ready for discharge   Short Term Goals: Ability to identify changes in lifestyle to reduce recurrence of condition will improve Ability to verbalize feelings will improve Ability to demonstrate self-control will improve Ability to identify and develop effective coping behaviors will improve Ability to maintain clinical measurements within normal limits will improve Compliance with prescribed medications will improve     Medication Management: Evaluate patient's response, side effects, and tolerance of medication regimen.  Therapeutic Interventions: 1 to 1 sessions, Unit Group sessions and Medication administration.  Evaluation of Outcomes: Not Met   RN Treatment Plan for Primary Diagnosis: Schizoaffective disorder, bipolar type ([place]) Long Term Goal(s): Knowledge of disease and therapeutic regimen to maintain health will improve  Short Term Goals: Ability to remain free from injury will improve, Ability to demonstrate self-control, Ability to participate in decision making will improve, Ability to verbalize feelings will improve, Ability to identify and develop effective coping behaviors will improve, and Compliance with prescribed medications will improve  Medication Management: RN will administer medications as ordered by provider, will assess  and evaluate patient's response and provide education to patient for prescribed medication. RN will report any adverse and/or side effects to prescribing provider.  Therapeutic Interventions: 1 on 1 counseling sessions, Psychoeducation, Medication administration, Evaluate responses to treatment, Monitor vital signs and CBGs as ordered, Perform/monitor CIWA, COWS, AIMS and Fall Risk screenings as ordered, Perform wound care treatments as ordered.  Evaluation of Outcomes: Not Met   LCSW Treatment Plan for Primary Diagnosis: Schizoaffective disorder, bipolar type ([place]) Long Term Goal(s): Safe transition to appropriate next level of care at discharge, Engage patient in therapeutic group addressing interpersonal concerns.  Short Term Goals: Engage patient in aftercare planning with referrals and resources, Increase social support, Increase ability to appropriately verbalize feelings, Increase emotional regulation, Facilitate acceptance of mental health diagnosis and concerns, and Increase skills for wellness and recovery  Therapeutic Interventions: Assess for all discharge needs, 1 to 1 time with Social worker, Explore available resources and support systems, Assess for adequacy in community support network, Educate family and significant other(s) on suicide prevention, Complete Psychosocial Assessment, Interpersonal group therapy.  Evaluation of Outcomes: Not Met   Progress in Treatment:  Attending groups: No. Participating in groups: No. Taking medication as prescribed: Yes. Toleration medication: Yes. Family/Significant other contact made: Yes, individual(s) contacted:  sister Patient understands diagnosis: No. Discussing patient identified problems/goals with staff: No. Medical problems stabilized or resolved: Yes. Denies suicidal/homicidal ideation: Yes. Issues/concerns per patient self-inventory: No. Other: None   New problem(s) identified: No, Describe:  None   New Short Term/Long  Term Goal(s):medication stabilization, elimination of SI thoughts, development of comprehensive mental wellness plan.    Patient Goals: Declined to participate   Discharge Plan or Barriers: CSW made an ACTT referral for pt. An APS report has been made for pt.    Reason for Continuation of Hospitalization: Delusions  Mania Medication stabilization   Estimated Length of Stay: 3-5 days   Scribe for Treatment Team: Vassie Moselle, LCSW 12/05/2021 11:23 AM

## 2021-12-05 NOTE — Progress Notes (Signed)
Adult Psychoeducational Group Note  Date:  12/05/2021 Time:  8:31 PM  Group Topic/Focus:  Wrap-Up Group:   The focus of this group is to help patients review their daily goal of treatment and discuss progress on daily workbooks.  Participation Level:  Active  Participation Quality:  Appropriate  Affect:  Anxious and Appropriate  Cognitive:  Appropriate  Insight: Appropriate  Engagement in Group:  Engaged  Modes of Intervention:  Discussion  Additional Comments:  Pt stated her goal for today was to focus on her treatment plan. Pt stated she accomplished her goal today. Pt stated she talked with her doctor and with her social worker about her care today. Pt rated her overall day a 10. Pt stated she was able to contact her sister today which improved her overall day. Pt stated she felt better about herself tonight. Pt stated she was able to attend  all meals today . Pt stated she took all medications provided today. Pt stated her appetite was good today. Pt rated her sleep last night was pretty good. Pt stated the goal tonight was to get some rest. Pt stated she had no physical pain tonight. Pt admitted to dealing with visual hallucinations and auditory issues tonight. Pt nurse was updated on the situation. Pt denies thoughts of harming herself or others. Pt stated she would alert staff if anything changed.  [name] 12/05/2021, 8:31 PM

## 2021-12-05 NOTE — Progress Notes (Addendum)
[facility] MD Progress Note  12/05/2021 5:01 PM Paula Dickson  MRN:  [id number] Subjective:  Paula Dickson is a 56 yr old female who presents with bizarre behaviors and nonsensical speech after medication noncompliance.  PPHx is significant for Schizoaffective Disorder- Bipolar type complicated by medication noncompliance and previous suicide attempt (OD in 2018), multiple hospitalizations (latest 11/21 [facility]).   Case was discussed in the multidisciplinary team.  MAR was reviewed and patient was compliant with medications.  Sleep hours not charted.   Psychiatry team made following the recommendations yesterday: -Continue Geodon 80 mg BID -Continue Restoril 30 mg QHS -Continue Depakote 1224m (first dose accepted by pt on 12-01-21 - VA level needed on 12-06-21) -Continue Zyprexa  5 mg bid -Continue Amantadine 1032mBID, EPS prophylaxis -Continue propanolol 10 mg tid  -Continue to hold Losartan 25 mg daily  IVC first eval completed 12-02-21. IVC served to pt 12-02-21. 24 hour eval completed 12-02-21.   FORCED MED ORDER Started 1/5  - 1/12!!!! - Geodon 8035mID PO or 53m30m if refuses PO - Zyprexa 5mg 98mQHS or 5mg I53mf refuses PO   The patient remains psychotic and cannot provide reliable subjective information about her mental status or response to medications. Patient has more word salad and is less goal oriented and is more tangential in her speech.  Patient still remains ability to answer questions with very simple answers before begetting rambling.  Patient denied SI and HI. When asked about hallucinatory experiences, she reported "probably" when asked if she was having auditory or visual hallucinations.  Patient did mention something about "my money running out" at different times in her rambling on assessment today and there appears to be some little concern about her length of stay in the hospital and her ability to afford this.  Otherwise patient continues to be compliant with her medications and has  not had a behavioral episode in the last 24 hours.  Principal Problem: Schizoaffective disorder, bipolar type (HCC) DWest Bishopnosis: Principal Problem:   Schizoaffective disorder, bipolar type (HCC) AAscutneyve Problems:   Hyperthyroidism   Benign essential HTN   AKI (acute kidney injury) (HCC)  Odessagnitive dysfunction in chronic schizophrenia (HCC)  [place] Time spent with patient: 20 minutes  Past Psychiatric History: See H&P  Past Medical History:  Past Medical History:  Diagnosis Date   Bipolar affective disorder (HCC)  [place] disorder (HCC)  [place]    History of chicken pox    History of depression    History of genital warts    history of heart murmur    History of high blood pressure    History of thyroid disease    History of UTI    Hypertension    Low TSH level 07/13/2017   Schizophrenia (HCC)  [place]ameronPast Surgical History:  Procedure Laterality Date   ABLATION ON ENDOMETRIOSIS     CYST REMOVAL NECK     around 11 years ago /benign   MULTIPLE TOOTH EXTRACTIONS     Family History:  Family History  Problem Relation Age of Onset   Arthritis Father    Hyperlipidemia Father    High blood pressure Father    Diabetes Sister    Diabetes Mother    Diabetes Brother    Mental illness Brother    Alcohol abuse Paternal Uncle    Alcohol abuse Paternal Grandfather    Breast cancer Maternal Aunt    Breast cancer Paternal Aunt  High blood pressure Sister    Mental illness Other        runs in family   Family Psychiatric  History: See H&P Social History:  Social History   Substance and Sexual Activity  Alcohol Use Not Currently     Social History   Substance and Sexual Activity  Drug Use Not Currently    Social History   Socioeconomic History   Marital status: Single    Spouse name: Not on file   Number of children: Not on file   Years of education: Not on file   Highest education level: Patient refused  Occupational History   Not on file   Tobacco Use   Smoking status: Never   Smokeless tobacco: Never  Vaping Use   Vaping Use: Never used  Substance and Sexual Activity   Alcohol use: Not Currently   Drug use: Not Currently   Sexual activity: Not Currently  Other Topics Concern   Not on file  Social History Narrative   ** Merged History Encounter **       Social Determinants of Health   Financial Resource Strain: Not on file  Food Insecurity: Not on file  Transportation Needs: Not on file  Physical Activity: Not on file  Stress: Not on file  Social Connections: Not on file   Additional Social History:                         Sleep:  Not documented  Appetite:  Good  Current Medications: Current Facility-Administered Medications  Medication Dose Route Frequency Provider Last Rate Last Admin   acetaminophen (TYLENOL) tablet 650 mg  650 mg Oral Q6H PRN Ethelene Hal, NP   650 mg at 11/27/21 2347   alum & mag hydroxide-simeth (MAALOX/MYLANTA) 200-200-20 MG/5ML suspension 30 mL  30 mL Oral Q4H PRN Ethelene Hal, NP       amantadine (SYMMETREL) capsule 100 mg  100 mg Oral BID Damita Dunnings B, MD   100 mg at 12/05/21 1622   divalproex (DEPAKOTE ER) 24 hr tablet 750 mg  750 mg Oral Q2000 McQuilla, Jai B, MD       magnesium hydroxide (MILK OF MAGNESIA) suspension 30 mL  30 mL Oral Daily PRN Ethelene Hal, NP       melatonin tablet 5 mg  5 mg Oral QHS Hill, Jackie Plum, MD   5 mg at 12/04/21 2107   methimazole (TAPAZOLE) tablet 5 mg  5 mg Oral BID , Ovid Curd, MD   5 mg at 12/05/21 1622   OLANZapine (ZYPREXA) tablet 5 mg  5 mg Oral BID , Ovid Curd, MD   5 mg at 12/05/21 8315   Or   OLANZapine (ZYPREXA) injection 5 mg  5 mg Intramuscular BID , Ovid Curd, MD       OLANZapine zydis (ZYPREXA) disintegrating tablet 5 mg  5 mg Oral Q8H PRN , Ovid Curd, MD   5 mg at 12/01/21 1203   And   ziprasidone (GEODON) injection 20 mg  20 mg Intramuscular PRN  , Ovid Curd, MD       pantoprazole (PROTONIX) EC tablet 20 mg  20 mg Oral Daily Rosezetta Schlatter, MD   20 mg at 12/05/21 0809   [START ON 12/06/2021] propranolol ER (INDERAL LA) 24 hr capsule 60 mg  60 mg Oral Daily , , MD       temazepam (RESTORIL) capsule 30 mg  30 mg Oral QHS Hill, Jackie Plum,  MD   30 mg at 12/04/21 2107   white petrolatum (VASELINE) gel   Topical PRN Rosezetta Schlatter, MD   Given at 12/04/21 1516   ziprasidone (GEODON) capsule 80 mg  80 mg Oral BID WC , Ovid Curd, MD   80 mg at 12/05/21 1622   Or   ziprasidone (GEODON) injection 20 mg  20 mg Intramuscular BID WC , Ovid Curd, MD        Lab Results:  Results for orders placed or performed during the hospital encounter of 11/23/21 (from the past 48 hour(s))  Basic metabolic panel     Status: Abnormal   Collection Time: 12/04/21  6:51 AM  Result Value Ref Range   Sodium 139 135 - 145 mmol/L   Potassium 4.0 3.5 - 5.1 mmol/L   Chloride 107 98 - 111 mmol/L   CO2 26 22 - 32 mmol/L   Glucose, Bld 92 70 - 99 mg/dL    Comment: Glucose reference range applies only to samples taken after fasting for at least 8 hours.   BUN 21 (H) 6 - 20 mg/dL   Creatinine, Ser 1.53 (H) 0.44 - 1.00 mg/dL   Calcium 9.7 8.9 - 10.3 mg/dL   GFR, Estimated 39 (L) >60 mL/min    Comment: (NOTE) Calculated using the CKD-EPI Creatinine Equation (2021)    Anion gap 6 5 - 15    Comment: Performed at [facility], [place] [address], [place], [place] [address]  Hepatic function panel     Status: Abnormal   Collection Time: 12/05/21  6:36 AM  Result Value Ref Range   Total Protein 7.5 6.5 - 8.1 g/dL   Albumin 4.1 3.5 - 5.0 g/dL   AST 12 (L) 15 - 41 U/L   ALT 14 0 - 44 U/L   Alkaline Phosphatase 68 38 - 126 U/L   Total Bilirubin 0.6 0.3 - 1.2 mg/dL   Bilirubin, Direct 0.1 0.0 - 0.2 mg/dL   Indirect Bilirubin 0.5 0.3 - 0.9 mg/dL    Comment: Performed at [facility], [place]  [address] [place], [place] [address]  CBC     Status: None   Collection Time: 12/05/21  6:36 AM  Result Value Ref Range   WBC 5.1 4.0 - 10.5 K/uL   RBC 4.64 3.87 - 5.11 MIL/uL   Hemoglobin 13.3 12.0 - 15.0 g/dL   HCT 42.2 36.0 - 46.0 %   MCV 90.9 80.0 - 100.0 fL   MCH 28.7 26.0 - 34.0 pg   MCHC 31.5 30.0 - 36.0 g/dL   RDW 13.0 11.5 - 15.5 %   Platelets 240 150 - 400 K/uL   nRBC 0.0 0.0 - 0.2 %    Comment: Performed at [facility], [place] [address] [place], [place] [address]  Valproic acid level     Status: Abnormal   Collection Time: 12/05/21  6:36 AM  Result Value Ref Range   Valproic Acid Lvl 117 (H) 50.0 - 100.0 ug/mL    Comment: Performed at [facility], [place] [address], [place], [place] [address]  Basic metabolic panel     Status: Abnormal   Collection Time: 12/05/21  6:36 AM  Result Value Ref Range   Sodium 144 135 - 145 mmol/L   Potassium 4.2 3.5 - 5.1 mmol/L   Chloride 109 98 - 111 mmol/L   CO2 26 22 - 32 mmol/L   Glucose, Bld 91 70 - 99 mg/dL    Comment:  Glucose reference range applies only to samples taken after fasting for at least 8 hours.   BUN 25 (H) 6 - 20 mg/dL   Creatinine, Ser 1.42 (H) 0.44 - 1.00 mg/dL   Calcium 10.4 (H) 8.9 - 10.3 mg/dL   GFR, Estimated 43 (L) >60 mL/min    Comment: (NOTE) Calculated using the CKD-EPI Creatinine Equation (2021)    Anion gap 9 5 - 15    Comment: Performed at [facility], [place] [address], [place], [place] [address]    Blood Alcohol level:  Lab Results  Component Value Date   [facility] <10 11/22/2021   ETH <10 69/79/4801    Metabolic Disorder Labs: Lab Results  Component Value Date   HGBA1C 6.1 (H) 11/24/2021   MPG 128.37 11/24/2021   MPG 119.76 03/28/2021   Lab Results  Component Value Date   PROLACTIN 11.0 03/14/2019   Lab Results  Component Value Date   CHOL 127 11/24/2021   TRIG 161 (H) 11/24/2021   HDL 39 (L) 11/24/2021   CHOLHDL 3.3 11/24/2021    VLDL 32 11/24/2021   LDLCALC 56 11/24/2021   LDLCALC 95 10/26/2020    Physical Findings: AIMS: Facial and Oral Movements Muscles of Facial Expression: None, normal Lips and Perioral Area: None, normal Jaw: None, normal Tongue: None, normal,Extremity Movements Upper (arms, wrists, hands, fingers): None, normal Lower (legs, knees, ankles, toes): None, normal, Trunk Movements Neck, shoulders, hips: None, normal, Overall Severity Severity of abnormal movements (highest score from questions above): None, normal Incapacitation due to abnormal movements: None, normal Patient's awareness of abnormal movements (rate only patient's report): No Awareness, Dental Status Current problems with teeth and/or dentures?: No Does patient usually wear dentures?: No  CIWA:    COWS:     Musculoskeletal: Strength & Muscle Tone: within normal limits Gait & Station: Stable Patient leans: N/A  Psychiatric Specialty Exam:  Presentation  General Appearance: Appropriate for Environment; Casual  Eye Contact:Fleeting  Speech:Pressured (Mostly word salad)  Speech Volume:Increased  Handedness:Right   Mood and Affect  Mood:Euthymic  Affect:Inappropriate (Inappropriate laughter, labile at times irritable appearance and then the next moment appears happy and laughing)   Thought Process  Thought Processes:Irrevelant  Descriptions of Associations:Tangential  Orientation:Full (Time, Place and Person)  Thought Content:Delusions; Scattered  History of Schizophrenia/Schizoaffective disorder:Yes  Duration of Psychotic Symptoms:Greater than six months  Hallucinations:Hallucinations: Visual; Auditory Description of Auditory Hallucinations: "probably" Description of Visual Hallucinations: "probably"  Ideas of Reference:Paranoia; Delusions  Suicidal Thoughts:Suicidal Thoughts: No  Homicidal Thoughts:Homicidal Thoughts: No   Sensorium  Memory:Immediate Poor; Recent  Poor  Judgment:Impaired  Insight:None   Executive Functions  Concentration:Poor  Attention Span:Poor  Recall:Poor  Fund of Knowledge:Poor  Language:Poor   Psychomotor Activity  Psychomotor Activity:Psychomotor Activity: Normal   Assets  Assets:Resilience   Sleep  Sleep: Not documented   Physical Exam: Physical Exam HENT:     Head: Normocephalic and atraumatic.  Pulmonary:     Effort: Pulmonary effort is normal.  Neurological:     Mental Status: She is alert.   Review of Systems  Psychiatric/Behavioral:  Positive for hallucinations. Negative for suicidal ideas.   Blood pressure (!) 153/85, pulse 83, temperature 97.9 F (36.6 C), temperature source Oral, resp. rate 18, height 5' (1.524 m), weight 76.7 kg, SpO2 98 %. Body mass index is 33.01 kg/m.   Treatment Plan Summary: Daily contact with patient to assess and evaluate symptoms and progress in treatment and Medication management  Paula Dickson is a 56 yo patient  w/ PPH of schizophrenia.  Patient presentation appears to be worse - this may be 2/2 elevated Depakote level. We will decrease Depakote and reassess level in 3-4 days.  Unfortunately patient does not appear to be stabilizing on current regimen at this point needs to be considered for Clozaril.   Schizoaffective Disorder- Bipolar type: IVC first eval completed 12-02-21. IVC served to pt 12-02-21. 24 hour eval completed 12-02-21.   FORCED MED ORDER Started 1/5  - 1/12!!!! - Geodon 76m BID PO or 226mIM if refuses PO - Zyprexa 45m73mO QHS or 45mg51m if refuses PO   Regular scheduled meds -Continue Geodon 80 mg BID -Continue Restoril 30 mg QHS -Decrease Depakote to ER 750mg27m1/9) VA level: 117, CBC-WNL, hepatic function panel-AST 12  -Continue Zyprexa  5 mg bid -Continue Amantadine 100mg 2m EPS prophylaxis - Begin Clozaril work-up: CBC, TRP, CRP, ESR, BMP, EKG-pending   HTN   Tachycardia: -Change propranolol from 10 mg tid to ER 60 mg once daily   -Continue to hold Losartan 25 mg daily - Creatinine 1.42   AKI: -Cr and BUN slightly increased from yesterday. Will push PO fluids, re-check in morning again -Daily BMP -Encourage PO fluid intake: Gatorade and H2O   Hyperthyroidism: Spoke with hospitalist (1/3)  -Re-start methimazole at 5 mg bid  -TSH (12/29)- 6.847>>> 12.328 (1/3)>>>6.397 (12-02-21) -Free T4 0.84 wnl (12-02-21)  -Follow up with PCP for further management   Other PRNs:  -Continue Protonix 20 mg daily -Continue PRN's: Tylenol, Maalox, Atarax, Milk of Magnesia, Trazodone     PGY-2 Jai B Freida Busman/07/2022, 5:01 PM  Total Time Spent in Direct Patient Care:  I personally spent 30 minutes on the unit in direct patient care. The direct patient care time included face-to-face time with the patient, reviewing the patient's chart, communicating with other professionals, and coordinating care. Greater than 50% of this time was spent in counseling or coordinating care with the patient regarding goals of hospitalization, psycho-education, and discharge planning needs.  I have independently evaluated the patient during a face-to-face assessment on 12/05/21. I reviewed the patient's chart, and I participated in key portions of the service. I discussed the case with the House Doctor, hospitalI agree with the assessment and plan of care as documented in the House [employer] as addended by me or notated below:  Pt is still floridly psychotic despite dual antipsychotic therapy - we will start workup for clozapine and initiate when cleared. Decrease depakote due to elevated level.  Monitor AKI and push PO fluids.   NathanJanine Limbosychiatrist

## 2021-12-05 NOTE — Group Note (Signed)
LCSW Group Therapy Notes  Type of Therapy and Topic: Group Therapy: Healthy Vs. Unhealthy Coping Strategies  Date and Time: 12/05/2021 at 1:00pm  Participation Level: [facility] PARTICIPATION LEVEL: Did Not Attend  Description of Group:  In this group, patients will be encouraged to explore their healthy and unhealthy coping strategics. Coping strategies are actions that we take to deal with stress, problems, or uncomfortable emotions in our daily lives. Each patient will be challenged to read some scenarios and discuss the unhealthy and healthy coping strategies within those scenarios. Also, each patient will be challenged to describe current healthy and unhealthy strategies that they use in their own lives and discuss the outcomes and barriers to those strategies. This group will be process-oriented, with patients participating in exploration of their own experiences as well as giving and receiving support and challenge from other group members.  Therapeutic Goals: Patient will identify personal healthy and unhealthy coping strategies. Patient will identify healthy and unhealthy coping strategies, in others, through scenarios.  Patient will identify expected outcomes of healthy and unhealthy coping strategies. Patient will identify barriers to using healthy coping strategies.   Summary of Patient Progress: did not attend   Therapeutic Modalities:  Cognitive Behavioral Therapy Solution Focused Therapy Motivational Interviewing  Darletta Moll MSW, [place] Worker  [facility]

## 2021-12-05 NOTE — Progress Notes (Signed)
Pt was encouraged but didn't attend orientation/goals group.

## 2021-12-05 NOTE — BHH Counselor (Signed)
APS case worker Paula Dickson met with this patient and completed evaluation. Pt was able to answer every question appropriately when asked.   CSW will follow up with case worker when this case has been staff with the rest of the team.     Darletta Moll MSW, [facility]

## 2021-12-05 NOTE — [facility] Group Notes (Signed)
Pt. Didn't attend group. 

## 2021-12-05 NOTE — Progress Notes (Signed)
D:  Paula Dickson has been up and pacing the hall.  She did not attend evening wrap up group.  She did obtain snack.  She continues to be hyper verbal, tangential, silly, incoherent and hyper religious.  She continually laughs at unknown things and difficult to understand.  She did eventually take her medications but had to be prompted to keep taking them as she was unable to stop laughing.  She did drink two large glasses of ice water while taking her medications.  She denied any pain or discomfort and appeared to be in no physical distress.   A:  1:1 with RN for support and encouragement.  Medications given as ordered.  Q 15 minute checks maintained for safety.  Encouraged participation in group and unit activities.   R:  She is currently resting with her eyes closed and appears to be asleep.  She remains safe on the unit.  We will continue to monitor the progress towards her goals.

## 2021-12-05 NOTE — Group Note (Signed)
Recreation Therapy Group Note   Group Topic:Healthy Decision Making  Group Date: 12/05/2021 Start Time: 9357 End Time: 0177 Facilitators: [name], LRT,CTRS Location: [address] Dayroom   Goal Area(s) Addresses:  Patient will effectively work with peer towards shared goal.  Patient will identify factors that guided their decision making.  Patient will pro-socially communicate ideas during group session.   Group Description: Patients were given a scenario that they were going to be stranded on a deserted [place] for several months before being rescued. Writer tasked them with making a list of 15 things they would choose to bring with them for "survival". The list of items was prioritized most important to least. Each patient would come up with their own list, then work together to create a new list of 15 items while in a group of 3-5 peers. LRT discussed each person's list and how it differed from others. The debrief included discussion of priorities, good decisions versus bad decisions, and how it is important to think before acting so we can make the best decision possible. LRT tied the concept of effective communication among group members to patient's support systems outside of the hospital and its benefit post discharge.   Affect/Mood: Manic   Participation Level: Minimal   Participation Quality: Maximum Cues   Behavior: Disruptive and Hyperverbal   Speech/Thought Process: Disorganized   Insight: Limited   Judgement: Limited   Modes of Intervention: Group work   Patient Response to Interventions:  Attentive   Education Outcome:  Acknowledges education and In group clarification offered    Clinical Observations/Individualized Feedback: Pt needed constant redirection for being disruptive with loud talking, inappropriate laughing and random conversation.  Pt would get back on track but then go right back to disruptive behaviors.  Pt did not complete an individual list.   During processing, pt expressed your heart and soul influences decisions.  Pt went on to say "we all go through things but God has our backs".  Pt then started rambling about getting a job, family and the phones.    Plan: Continue to engage patient in RT group sessions 2-3x/week.   [name], LRT,CTRS 12/05/2021 1:01 PM

## 2021-12-06 DIAGNOSIS — F250 Schizoaffective disorder, bipolar type: Secondary | ICD-10-CM | POA: Diagnosis not present

## 2021-12-06 LAB — C-REACTIVE PROTEIN: CRP: 0.7 mg/dL (ref <1.0)

## 2021-12-06 LAB — T4, FREE: Free T4: 0.69 ng/dL (ref 0.61–1.12)

## 2021-12-06 MED ORDER — OLANZAPINE 10 MG PO TABS
10.0000 mg | ORAL_TABLET | Freq: Every day | ORAL | Status: DC
  Administered 2021-12-06: 10 mg via ORAL
  Filled 2021-12-06 (×6): qty 1

## 2021-12-06 MED ORDER — OLANZAPINE 10 MG IM SOLR
5.0000 mg | Freq: Every day | INTRAMUSCULAR | Status: DC
  Administered 2021-12-08: 5 mg via INTRAMUSCULAR
  Filled 2021-12-06 (×6): qty 10

## 2021-12-06 MED ORDER — OLANZAPINE 10 MG IM SOLR
10.0000 mg | Freq: Every day | INTRAMUSCULAR | Status: DC
  Administered 2021-12-07 – 2021-12-09 (×2): 10 mg via INTRAMUSCULAR
  Filled 2021-12-06 (×5): qty 10

## 2021-12-06 MED ORDER — DOCUSATE SODIUM 100 MG PO CAPS
100.0000 mg | ORAL_CAPSULE | Freq: Every day | ORAL | Status: DC
  Administered 2021-12-06 – 2021-12-07 (×2): 100 mg via ORAL
  Filled 2021-12-06 (×6): qty 1

## 2021-12-06 MED ORDER — OLANZAPINE 5 MG PO TABS
5.0000 mg | ORAL_TABLET | Freq: Every day | ORAL | Status: DC
  Administered 2021-12-07 – 2021-12-10 (×3): 5 mg via ORAL
  Filled 2021-12-06 (×6): qty 1

## 2021-12-06 NOTE — Progress Notes (Addendum)
[facility] MD Progress Note  12/06/2021 4:11 PM Paula Dickson  MRN:  [id number] Subjective:  Paula Dickson is a 56 yr old female who presents with bizarre behaviors and nonsensical speech after medication noncompliance.  PPHx is significant for Schizoaffective Disorder- Bipolar type complicated by medication noncompliance and previous suicide attempt (OD in 2018), multiple hospitalizations (latest 11/21 [facility]).   Case was discussed in the multidisciplinary team.  MAR was reviewed and patient was compliant with medications.  Sleep hours not charted.   Psychiatry team made following the recommendations yesterday: IVC first eval completed 12-02-21. IVC served to pt 12-02-21. 24 hour eval completed 12-02-21.   FORCED MED ORDER Started 1/5  - 1/12!!!! - Geodon 50m BID PO or 2182mIM if refuses PO - Zyprexa 82m37mO QHS or 82mg20m if refuses PO   Regular scheduled meds -Continue Geodon 80 mg BID -Continue Restoril 30 mg QHS -Decrease Depakote to ER 750mg43m1/9) VA level: 117, CBC-WNL, hepatic function panel-AST 12  -Continue Zyprexa  5 mg bid -Continue Amantadine 100mg 77m EPS prophylaxis - Begin Clozaril work-up: CBC, TRP, CRP, ESR, BMP, EKG-pending  Objectively patient appears to have less word salad and is more goal oriented in what she is talking about. Patient is also more aware of her surroundings. Subjectively patient appears to endorse that she misses her family and is upset that she cannot recall her code so that her family is able to call, without her having to call. Patient endorses that it is hard to reach her brother and admits that he may be busy. Patient reports that she does not wish to speak with ACT and endorses that she has had one in the past, and does not feel that ACTT are honest about what they are willing to do for their patients. After some conversation patient endorses that if the ACTT will help her get to her appts and make sure she has her medications, she is willing to participate  in evaluations. Patient reports she wishes to be discharged and is worried about some bills. Patient is not able to recall what bills she is worried about. Patient does not endorse SI, HI and AVH. Patient reports that she does believe she has the power to get special messages from God about the devil.  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 (HCC)  Storyal Time spent with patient: 20 minutes  Past Psychiatric History: See H&P  Past Medical History:  Past Medical History:  Diagnosis Date   Bipolar affective disorder (HCC)  [place] disorder (HCC)  [place] arthritis    History of chicken pox    History of depression    History of genital warts    history of heart murmur    History of high blood pressure    History of thyroid disease    History of UTI    Hypertension    Low TSH level 07/13/2017   Schizophrenia (HCC)  [place]:  Procedure Laterality Date   ABLATION ON ENDOMETRIOSIS     CYST REMOVAL NECK     around 11 years ago /benign   MULTIPLE TOOTH EXTRACTIONS     Family History:  Family History  Problem Relation Age of Onset   Arthritis Father  Hyperlipidemia Father    High blood pressure Father    Diabetes Sister    Diabetes Mother    Diabetes Brother    Mental illness Brother    Alcohol abuse Paternal Uncle    Alcohol abuse Paternal Grandfather    Breast cancer Maternal Aunt    Breast cancer Paternal Aunt    High blood pressure Sister    Mental illness Other        runs in family   Family Psychiatric  History:  See H&P Social History:  Social History   Substance and Sexual Activity  Alcohol Use Not Currently     Social History   Substance and Sexual Activity  Drug Use Not Currently     Social History   Socioeconomic History   Marital status: Single    Spouse name: Not on file   Number of children: Not on file   Years of education: Not on file   Highest education level: Patient refused  Occupational History   Not on file  Tobacco Use   Smoking status: Never   Smokeless tobacco: Never  Vaping Use   Vaping Use: Never used  Substance and Sexual Activity   Alcohol use: Not Currently   Drug use: Not Currently   Sexual activity: Not Currently  Other Topics Concern   Not on file  Social History Narrative   ** Merged History Encounter **       Social Determinants of Health   Financial Resource Strain: Not on file  Food Insecurity: Not on file  Transportation Needs: Not on file  Physical Activity: Not on file  Stress: Not on file  Social Connections: Not on file   Additional Social History:                         Sleep: Fair  Appetite:  Good  Current Medications: Current Facility-Administered Medications  Medication Dose Route Frequency Provider Last Rate Last Admin   acetaminophen (TYLENOL) tablet 650 mg  650 mg Oral Q6H PRN Ethelene Hal, NP   650 mg at 11/27/21 2347   alum & mag hydroxide-simeth (MAALOX/MYLANTA) 200-200-20 MG/5ML suspension 30 mL  30 mL Oral Q4H PRN Ethelene Hal, NP       amantadine (SYMMETREL) capsule 100 mg  100 mg Oral BID Damita Dunnings B, MD   100 mg at 12/06/21 0717   divalproex (DEPAKOTE ER) 24 hr tablet 750 mg  750 mg Oral Q2000 Bridie Colquhoun B, MD   750 mg at 12/05/21 2036   docusate sodium (COLACE) capsule 100 mg  100 mg Oral Daily Damita Dunnings B, MD   100 mg at 12/06/21 1300   magnesium hydroxide (MILK OF MAGNESIA) suspension 30 mL  30 mL Oral Daily PRN Ethelene Hal, NP       melatonin tablet 5 mg  5 mg Oral QHS Hill, Jackie Plum, MD   5 mg at 12/05/21 2037   methimazole (TAPAZOLE) tablet 5 mg  5 mg Oral BID Massengill, Ovid Curd, MD   5 mg at 12/06/21 0718   OLANZapine (ZYPREXA) tablet  5 mg  5 mg Oral BID Massengill, Ovid Curd, MD   5 mg at 12/06/21 8338   Or   OLANZapine (ZYPREXA) injection 5 mg  5 mg Intramuscular BID Massengill, Ovid Curd, MD       OLANZapine zydis (ZYPREXA) disintegrating tablet 5 mg  5 mg Oral Q8H PRN Janine Limbo, MD   5 mg at 12/01/21  1203   And   ziprasidone (GEODON) injection 20 mg  20 mg Intramuscular PRN Massengill, Ovid Curd, MD       pantoprazole (PROTONIX) EC tablet 20 mg  20 mg Oral Daily Rosezetta Schlatter, MD   20 mg at 12/06/21 5883   propranolol ER (INDERAL LA) 24 hr capsule 60 mg  60 mg Oral Daily Massengill, Ovid Curd, MD   60 mg at 12/06/21 0721   temazepam (RESTORIL) capsule 30 mg  30 mg Oral QHS Hill, Jackie Plum, MD   30 mg at 12/05/21 2037   white petrolatum (VASELINE) gel   Topical PRN Rosezetta Schlatter, MD   Given at 12/04/21 1516   ziprasidone (GEODON) capsule 80 mg  80 mg Oral BID WC Massengill, Ovid Curd, MD   80 mg at 12/06/21 2549   Or   ziprasidone (GEODON) injection 20 mg  20 mg Intramuscular BID WC Massengill, Ovid Curd, MD        Lab Results:  Results for orders placed or performed during the hospital encounter of 11/23/21 (from the past 48 hour(s))  Hepatic function panel     Status: Abnormal   Collection Time: 12/05/21  6:36 AM  Result Value Ref Range   Total Protein 7.5 6.5 - 8.1 g/dL   Albumin 4.1 3.5 - 5.0 g/dL   AST 12 (L) 15 - 41 U/L   ALT 14 0 - 44 U/L   Alkaline Phosphatase 68 38 - 126 U/L   Total Bilirubin 0.6 0.3 - 1.2 mg/dL   Bilirubin, Direct 0.1 0.0 - 0.2 mg/dL   Indirect Bilirubin 0.5 0.3 - 0.9 mg/dL    Comment: Performed at [facility], [place] [address], [place], [place] [address]  CBC     Status: None   Collection Time: 12/05/21  6:36 AM  Result Value Ref Range   WBC 5.1 4.0 - 10.5 K/uL   RBC 4.64 3.87 - 5.11 MIL/uL   Hemoglobin 13.3 12.0 - 15.0 g/dL   HCT 42.2 36.0 - 46.0 %   MCV 90.9 80.0 - 100.0 fL   MCH 28.7 26.0 - 34.0 pg   MCHC 31.5 30.0 - 36.0 g/dL   RDW 13.0 11.5 - 15.5 %    Platelets 240 150 - 400 K/uL   nRBC 0.0 0.0 - 0.2 %    Comment: Performed at [facility], [place] [address], [place], [place] [address]  Valproic acid level     Status: Abnormal   Collection Time: 12/05/21  6:36 AM  Result Value Ref Range   Valproic Acid Lvl 117 (H) 50.0 - 100.0 ug/mL    Comment: Performed at [facility], [place] [address] [place], [place] [address]  Basic metabolic panel     Status: Abnormal   Collection Time: 12/05/21  6:36 AM  Result Value Ref Range   Sodium 144 135 - 145 mmol/L   Potassium 4.2 3.5 - 5.1 mmol/L   Chloride 109 98 - 111 mmol/L   CO2 26 22 - 32 mmol/L   Glucose, Bld 91 70 - 99 mg/dL    Comment: Glucose reference range applies only to samples taken after fasting for at least 8 hours.   BUN 25 (H) 6 - 20 mg/dL   Creatinine, Ser 1.42 (H) 0.44 - 1.00 mg/dL   Calcium 10.4 (H) 8.9 - 10.3 mg/dL   GFR, Estimated 43 (L) >60 mL/min    Comment: (NOTE) Calculated using the CKD-EPI Creatinine Equation (2021)    Anion gap 9  5 - 15    Comment: Performed at [facility], [place] [address] [place], [place] [address]  CBC with Differential/Platelet     Status: None   Collection Time: 12/05/21  6:31 PM  Result Value Ref Range   WBC 5.3 4.0 - 10.5 K/uL   RBC 4.21 3.87 - 5.11 MIL/uL   Hemoglobin 12.2 12.0 - 15.0 g/dL   HCT 38.4 36.0 - 46.0 %   MCV 91.2 80.0 - 100.0 fL   MCH 29.0 26.0 - 34.0 pg   MCHC 31.8 30.0 - 36.0 g/dL   RDW 12.9 11.5 - 15.5 %   Platelets 228 150 - 400 K/uL   nRBC 0.0 0.0 - 0.2 %   Neutrophils Relative % 51 %   Neutro Abs 2.8 1.7 - 7.7 K/uL   Lymphocytes Relative 31 %   Lymphs Abs 1.6 0.7 - 4.0 K/uL   Monocytes Relative 9 %   Monocytes Absolute 0.5 0.1 - 1.0 K/uL   Eosinophils Relative 8 %   Eosinophils Absolute 0.4 0.0 - 0.5 K/uL   Basophils Relative 1 %   Basophils Absolute 0.0 0.0 - 0.1 K/uL   Immature Granulocytes 0 %   Abs Immature Granulocytes 0.01 0.00 - 0.07 K/uL    Comment:  Performed at [facility], [place] [name], [place], [place] [address]  Troponin I (High Sensitivity)     Status: None   Collection Time: 12/05/21  6:31 PM  Result Value Ref Range   Troponin I (High Sensitivity) 3 <18 ng/L    Comment: (NOTE) Elevated high sensitivity troponin I (hsTnI) values and significant  changes across serial measurements may suggest ACS but many other  chronic and acute conditions are known to elevate hsTnI results.  Refer to the "Links" section for chest pain algorithms and additional  guidance. Performed at [facility], [place] [address] [place], [place] [address]   C-reactive protein     Status: None   Collection Time: 12/05/21  6:31 PM  Result Value Ref Range   CRP 0.7 <1.0 mg/dL    Comment: Performed at [facility], [place] [address], [place], [place] [address]  Sedimentation rate     Status: None   Collection Time: 12/05/21  6:31 PM  Result Value Ref Range   Sed Rate 8 0 - 22 mm/hr    Comment: Performed at [facility], [place] [address] [place], [place] [address]  Brain natriuretic peptide     Status: None   Collection Time: 12/05/21  6:31 PM  Result Value Ref Range   B Natriuretic Peptide 8.6 0.0 - 100.0 pg/mL    Comment: Performed at [facility], [place] [address], [place], [place] [address]  Basic metabolic panel     Status: Abnormal   Collection Time: 12/05/21  6:31 PM  Result Value Ref Range   Sodium 143 135 - 145 mmol/L   Potassium 4.3 3.5 - 5.1 mmol/L   Chloride 108 98 - 111 mmol/L   CO2 26 22 - 32 mmol/L   Glucose, Bld 108 (H) 70 - 99 mg/dL    Comment: Glucose reference range applies only to samples taken after fasting for at least 8 hours.   BUN 28 (H) 6 - 20 mg/dL   Creatinine, Ser 1.60 (H) 0.44 - 1.00 mg/dL   Calcium 10.2 8.9 - 10.3 mg/dL   GFR, Estimated 37 (L) >60 mL/min    Comment: (NOTE) Calculated using the  CKD-EPI Creatinine Equation (2021)    Anion gap  9 5 - 15    Comment: Performed at [facility], [place] [address] [place], [place] [address]  TSH     Status: Abnormal   Collection Time: 12/05/21  6:31 PM  Result Value Ref Range   TSH 5.984 (H) 0.350 - 4.500 uIU/mL    Comment: Performed by a 3rd Generation assay with a functional sensitivity of <=0.01 uIU/mL. Performed at [facility], [place] [address], [place],  [address]   T4, free     Status: None   Collection Time: 12/05/21  6:31 PM  Result Value Ref Range   Free T4 0.69 0.61 - 1.12 ng/dL    Comment: (NOTE) Biotin ingestion may interfere with free T4 tests. If the results are inconsistent with the TSH level, previous test results, or the clinical presentation, then consider biotin interference. If needed, order repeat testing after stopping biotin. Performed at [facility], [place] [address], [place],  [address]     Blood Alcohol level:  Lab Results  Component Value Date   [facility] <10 11/22/2021   ETH <10 21/30/8657    Metabolic Disorder Labs: Lab Results  Component Value Date   HGBA1C 6.1 (H) 11/24/2021   MPG 128.37 11/24/2021   MPG 119.76 03/28/2021   Lab Results  Component Value Date   PROLACTIN 11.0 03/14/2019   Lab Results  Component Value Date   CHOL 127 11/24/2021   TRIG 161 (H) 11/24/2021   HDL 39 (L) 11/24/2021   CHOLHDL 3.3 11/24/2021   VLDL 32 11/24/2021   LDLCALC 56 11/24/2021   LDLCALC 95 10/26/2020    Physical Findings: AIMS: Facial and Oral Movements Muscles of Facial Expression: None, normal Lips and Perioral Area: None, normal Jaw: None, normal Tongue: None, normal,Extremity Movements Upper (arms, wrists, hands, fingers): None, normal Lower (legs, knees, ankles, toes): None, normal, Trunk Movements Neck, shoulders, hips: None, normal, Overall Severity Severity of abnormal movements (highest score from questions above): None, normal Incapacitation due to abnormal movements: None,  normal Patient's awareness of abnormal movements (rate only patient's report): No Awareness, Dental Status Current problems with teeth and/or dentures?: No Does patient usually wear dentures?: No  CIWA:    COWS:     Musculoskeletal: Strength & Muscle Tone: within normal limits Gait & Station: normal Patient leans: N/A  Psychiatric Specialty Exam:  Presentation  General Appearance: Casual  Eye Contact:Fair  Speech:Clear and Coherent  Speech Volume:Normal  Handedness:Right   Mood and Affect  Mood:Irritable  Affect:Congruent   Thought Process  Thought Processes:Disorganized (more goal oriented than yesterday)  Descriptions of Associations:Circumstantial  Orientation:Full (Time, Place and Person)  Thought Content:Delusions  History of Schizophrenia/Schizoaffective disorder:Yes  Duration of Psychotic Symptoms:Greater than six months  Hallucinations:Hallucinations: None  Ideas of Reference:None  Suicidal Thoughts:Suicidal Thoughts: No  Homicidal Thoughts:Homicidal Thoughts: No   Sensorium  Memory:Immediate Fair; Recent Poor  Judgment:Impaired  Insight:None   Executive Functions  Concentration:Poor  Attention Span:Poor  Recall:Poor  Fund of Knowledge:Poor  Language:Poor   Psychomotor Activity  Psychomotor Activity:Psychomotor Activity: -- (patient denies tremor,)   Assets  Assets:Resilience   Sleep  Sleep:Sleep: Fair (5.5)    Physical Exam: Physical Exam HENT:     Head: Normocephalic and atraumatic.  Pulmonary:     Effort: Pulmonary effort is normal.  Neurological:     Mental Status: She is alert.   Review of Systems  Gastrointestinal:  Positive for constipation.  Psychiatric/Behavioral:  Negative for suicidal ideas. The patient does not have insomnia.   Blood pressure 138/88, pulse 96, temperature 97.9 F (36.6 C), temperature source Oral, resp. rate 18, height 5' (1.524 m), weight 76.7 kg, SpO2 99 %. Body mass index is  33.01 kg/m.   Treatment Plan Summary: Daily contact with patient to assess and evaluate symptoms and progress in treatment and Medication management Mckensey is a 56 yo patient w/ PPH of schizophrenia. Patient's cognition appears improved from yesterday and it is possible that patient's word salad was instigated by possible encephalopathy from elevated Depakote level. Patient is also appearing to slowly get more hours of sleep and is more goal oriented in her speech and more aware of her surroundings and her behavior is slowly becoming more appropriate. Patient was also able to deny AVH today, but could not do that prior. Will hold off Clozaril and increase Zyprexa.   Schizoaffective Disorder- Bipolar type: IVC first eval completed 12-02-21. IVC served to pt 12-02-21. 24 hour eval completed 12-02-21.   FORCED MED ORDER Started 1/5  - 1/12!!!! - Geodon 8m BID PO or 259mIM if refuses PO - Zyprexa 6m32mO QHS or 6mg64m if refuses PO   Regular scheduled meds -Continue Geodon 80 mg BID -Continue Restoril 30 mg QHS -Continue Depakote to ER 750mg69m1/9) VA level: 117, CBC-WNL, hepatic function panel-AST 12  -Increase Zyprexa  to 5 mg daily and 10mg 49m-Continue Amantadine 100mg B21mEPS prophylaxis - Begin Clozaril work-up: CBC-WNL , TRP- WNL, CRP- WNL, ESR- WNL, EKG-pending   HTN   Tachycardia: -Change propranolol from 10 mg tid to ER 60 mg once daily  -Continue to hold Losartan 25 mg daily - Creatinine 1.42> 1.60   AKI: -Cr and BUN slightly increased from yesterday. Will push PO fluids, re-check in morning again -Daily BMP -Encourage PO fluid intake: Gatorade and H2O   Hyperthyroidism: Spoke with hospitalist (1/3)  -Continue methimazole at 5 mg bid  -TSH (12/29)- 6.847>>> 12.328 (1/3)>>>6.397 (12-02-21) -Free T4 0.84 wnl (12-02-21)  -Follow up with PCP for further management  Constipation - Colace 100mg   33mr PRNs:  -Continue Protonix 20 mg daily -Continue PRN's: Tylenol, Maalox,  Atarax, Milk of Magnesia, Trazodone       PGY-2 Makeba Delcastillo B McFreida Busman0/2023, 4:11 PM

## 2021-12-06 NOTE — Progress Notes (Signed)
°   12/05/21 2200  Psych Admission Type (Psych Patients Only)  Admission Status Involuntary  Psychosocial Assessment  Patient Complaints Anxiety  Eye Contact Fair  Facial Expression Animated  Affect Euphoric  Speech Incoherent;Pressured;Rapid;Slurred;Word salad  Interaction Attention-seeking;Assertive;Childlike  Motor Activity Slow;Shuffling  Appearance/Hygiene Improved  Behavior Characteristics Anxious;Restless  Mood Silly;Preoccupied;Suspicious;Labile  Thought Process  Coherency Disorganized;Flight of ideas;Incoherent;Tangential  Content Religiosity  Delusions Religious  Perception Hallucinations  Hallucination Auditory;Visual  Judgment Poor  Confusion Moderate  Danger to Self  Current suicidal ideation? Denies  Danger to Others  Danger to Others None reported or observed

## 2021-12-06 NOTE — Progress Notes (Signed)
Pt visible on the unit , pt very silly continues with tangential and disorganized speech, but pleasant and silly.     12/06/21 2100  Psych Admission Type (Psych Patients Only)  Admission Status Involuntary  Psychosocial Assessment  Patient Complaints Restlessness;Suspiciousness;Anxiety  Eye Contact Fair  Facial Expression Animated  Affect Euphoric  Speech Incoherent;Pressured;Rapid;Slurred;Word salad  Interaction Attention-seeking;Assertive;Dominating  Motor Activity Shuffling  Appearance/Hygiene Improved  Behavior Characteristics Pacing;Restless;Anxious;Fidgety  Mood Pleasant;Silly;Suspicious;Anxious  Thought Process  Coherency Disorganized  Content Religiosity  Delusions Religious  Perception Hallucinations  Hallucination Auditory;Visual  Judgment Poor  Confusion Moderate  Danger to Self  Current suicidal ideation? Denies  Danger to Others  Danger to Others None reported or observed

## 2021-12-06 NOTE — Progress Notes (Signed)
°   12/06/21 0530  Sleep  Number of Hours 5.25

## 2021-12-06 NOTE — Progress Notes (Signed)
Pt was encouraged but didn't attend orientation/goals group.

## 2021-12-06 NOTE — Group Note (Signed)
Recreation Therapy Group Note   Group Topic:Self-Esteem  Group Date: 12/06/2021 Start Time: 1000 End Time: 1055 Facilitators: [name], LRT,CTRS Location: [address] Dayroom   Goal Area(s) Addresses:  Patient will be able to identify benefits of positive self esteem.  Patient will successfully share why positive self esteem is important. Patient will be able to express how positive self esteem will benefit them post d/c.  Group Description:  How I See Me.  Patients and LRT discussed the importance of how they see themselves and their positive qualities. Patients were then given a picture of a blank face, and told to illustrate and describe how they see themselves and their positive traits.  Patients were given markers to complete the assignment. LRT played music for the group as they completed their assignment. Patients shared their completed assignment with each other.   Affect/Mood: Anxious and Manic   Participation Level: None   Participation Quality: Maximum Cues   Behavior: Disruptive   Speech/Thought Process: Disorganized and Flight of ideas   Insight: Lacking   Judgement: Lacking    Modes of Intervention: Art and Music   Patient Response to Interventions:  Attentive   Education Outcome:  Acknowledges education and In group clarification offered    Clinical Observations/Individualized Feedback: Pt came in late to group.  Pt was loud and disruptive to peers.  Pt was redirected numerous times to quiet down and be respectful of peers listening to the music.  Pt then began talking to LRT about random things that had nothing to do with the group.      Plan: Continue to engage patient in RT group sessions 2-3x/week.   [name], Glennis Brink 12/06/2021 12:38 PM

## 2021-12-06 NOTE — Progress Notes (Signed)
Adult Psychoeducational Group Note  Date:  12/06/2021 Time:  8:52 PM  Group Topic/Focus:  Wrap-Up Group:   The focus of this group is to help patients review their daily goal of treatment and discuss progress on daily workbooks.  Participation Level:  Active  Participation Quality:  Appropriate  Affect:  Anxious and Appropriate  Cognitive:  Disorganized, Confused, and Delusional  Insight: Appropriate  Engagement in Group:  Engaged  Modes of Intervention:  Discussion  Additional Comments:  Pt stated her goal for today was to focus on her treatment plan. Pt stated she accomplished her goal today. Pt stated she talked with her doctor and with her social worker about her care today. Pt rated her overall day a 10. Pt stated she made no calls today. Pt stated she felt better about herself tonight. Pt stated she was able to attend  all meals today . Pt stated she took all medications provided today. Pt stated her appetite was good today. Pt rated her sleep last night was pretty good. Pt stated the goal tonight was to get some rest. Pt stated she had some physical pain tonight. Pr stated she had some moderate pain in her right foot. Pt rated the moderate pain in her right foot a 7 on the pain level scale. Pt admitted to dealing with visual hallucinations and auditory issues tonight. Pt nurse was updated on the situation. Pt denies thoughts of harming herself or others. Pt stated she would alert staff if anything changed.  [name] 12/06/2021, 8:52 PM

## 2021-12-06 NOTE — Progress Notes (Signed)
Patient has been pacing the hall with a shuffling gait, complaining of pain in her vagina and dribbling. Patient remains hyperverbal, tangential, with rapid and pressured speech.  Patient currently denies SI, HI, and AVH. Patient has had decreased religiously focus conversation and is questioning about discharge.   Assess patient for safety, offer medications as planned, engage patient in 1:1 staff talks.   Continue to monitor as planned. Patient able to contract for safety.

## 2021-12-07 DIAGNOSIS — F250 Schizoaffective disorder, bipolar type: Secondary | ICD-10-CM | POA: Diagnosis not present

## 2021-12-07 LAB — BASIC METABOLIC PANEL
Anion gap: 10 (ref 5–15)
BUN: 24 mg/dL — ABNORMAL HIGH (ref 6–20)
CO2: 25 mmol/L (ref 22–32)
Calcium: 10.6 mg/dL — ABNORMAL HIGH (ref 8.9–10.3)
Chloride: 108 mmol/L (ref 98–111)
Creatinine, Ser: 1.29 mg/dL — ABNORMAL HIGH (ref 0.44–1.00)
GFR, Estimated: 48 mL/min — ABNORMAL LOW (ref >60)
Glucose, Bld: 102 mg/dL — ABNORMAL HIGH (ref 70–99)
Potassium: 4 mmol/L (ref 3.5–5.1)
Sodium: 143 mmol/L (ref 135–145)

## 2021-12-07 MED ORDER — AMANTADINE HCL 100 MG PO CAPS
100.0000 mg | ORAL_CAPSULE | Freq: Every day | ORAL | Status: DC
  Administered 2021-12-09: 100 mg via ORAL
  Filled 2021-12-07 (×4): qty 1

## 2021-12-07 MED ORDER — DICLOFENAC SODIUM 1 % EX GEL
2.0000 g | Freq: Four times a day (QID) | CUTANEOUS | Status: DC
  Administered 2021-12-07 – 2021-12-17 (×9): 2 g via TOPICAL
  Filled 2021-12-07: qty 100

## 2021-12-07 NOTE — Progress Notes (Signed)
°   12/07/21 0530  Sleep  Number of Hours 4.25

## 2021-12-07 NOTE — Progress Notes (Signed)
Pt continues to be argumentative and refused her PO medications. Will continue to offer PO, pt educated on forced medication order. Pt stated she took her medications earlier, writer tried to explain that was earlier medications and she has medications for the evening. Pt not able to be educated at this time. Pt continues to be disorganized and confused at times. Pt continues to get upset when people don't do what she wants    12/07/21 2000  Psych Admission Type (Psych Patients Only)  Admission Status Involuntary  Psychosocial Assessment  Patient Complaints Irritability;Suspiciousness;Restlessness  Eye Contact Fair  Facial Expression Animated  Affect Euphoric  Speech Incoherent;Pressured;Rapid;Slurred;Word salad;Argumentative  Interaction Attention-seeking;Assertive;Dominating  Motor Activity Shuffling  Appearance/Hygiene Improved  Behavior Characteristics Resistant to care;Anxious;Agressive verbally  Mood Suspicious;Labile;Preoccupied  Thought Process  Coherency Disorganized  Content Religiosity  Delusions Religious  Perception Hallucinations  Hallucination Auditory;Visual  Judgment Poor  Confusion Moderate  Danger to Self  Current suicidal ideation? Denies  Danger to Others  Danger to Others None reported or observed

## 2021-12-07 NOTE — Group Note (Signed)
Type of Therapy/Topic: Identifying Irrational Beliefs/Thoughts  Participation Level: Active/ Monopolizing   Description of Group: The purpose of this group is to assist patients in learning to identify irrational beliefs and thoughts that contribute to their negative emotions and experience positive emotions. Patients will be guided to discuss ways in which they have been effected by irrational thoughts and beliefs and how to transform those irrational beliefs into rational ones. Newly identified rational beliefs will be juxtaposed with experiences of positive emotions or situations, and patients will be challenged to use rational beliefs or thoughts to combat negative ones. Special emphasis will be placed on coping with irrational beliefs in conflict situations, and patients will process healthy conflict resolution skills.  Therapeutic Goals: 1. Patient will identify two irrational thoughts or beliefs  to reflect on in order to balance out those thoughts 2. Patient will label two or more irrational thoughts/beliefs that they find the most difficult to cope with 3. Patient will demonstrate positive conflict resolution skills through discussion and/or role plays that will assist in transforming irrational thoughts or beliefs into positive ones.  Summary of Patient Progress: Pt was off topic and monopolizing group session.     Therapeutic Modalities: Cognitive Behavioral Therapy Feelings Identification Dialectical Behavioral Therapy

## 2021-12-07 NOTE — Group Note (Signed)
Recreation Therapy Group Note   Group Topic:Personal Development  Group Date: 12/07/2021 Start Time: 1000 End Time: 9179 Facilitators: [name], LRT,CTRS Location: [address] Dayroom   Goal Area(s) Addresses:  Patient will successfully identify characteristics that define you. Patient will successfully identify positive actions and behaviors they can use post d/c.   Group Description:  Totika.  LRT introduced the game Totika to patients.  The game is played like [place].  The difference is all the blocks consist of the colors blue, orange, red and green.  Patient will pull a block and place it on top of the stack.  Which ever color the patient picks, LRT will ask them a question that corresponds with the color from deck of card.  Once patient answers the question, the next person will go.   Affect/Mood: Labile   Participation Level: None   Participation Quality: None   Behavior: Hesitant   Speech/Thought Process: Disorganized   Insight: None   Judgement: None   Modes of Intervention: Cooperative Play   Patient Response to Interventions:  None   Education Outcome:  Acknowledges education and In group clarification offered    Clinical Observations/Individualized Feedback: Pt still had random laughter.  Pt expressed not feeling well.  Pt left after instructions were given and didn't return.    Plan: Continue to engage patient in RT group sessions 2-3x/week.   [name], LRT,CTRS 12/07/2021 2:01 PM

## 2021-12-07 NOTE — BHH Group Notes (Signed)
Adult Psychoeducational Group Note  Date:  12/07/2021 Time:  9:10 AM  Group Topic/Focus:  Goals Group:   The focus of this group is to help patients establish daily goals to achieve during treatment and discuss how the patient can incorporate goal setting into their daily lives to aide in recovery.  Participation Level:  Active  Participation Quality:  Intrusive  Affect:  Defensive and Excited  Cognitive:  Oriented  Insight: Lacking  Engagement in Group:  Engaged  Modes of Intervention:  Discussion and Exploration    Paula Dickson 12/07/2021, 9:10 AM

## 2021-12-07 NOTE — Progress Notes (Signed)
[facility] MD Progress Note  12/07/2021 5:38 PM Paula Dickson  MRN:  [id number] Subjective:  [name] is a 56 yr old female who presents with bizarre behaviors and nonsensical speech after medication noncompliance.  PPHx is significant for Schizoaffective Disorder- Bipolar type complicated by medication noncompliance and previous suicide attempt (OD in 2018), multiple hospitalizations (latest 11/21 [facility]).   Case was discussed in the multidisciplinary team.  MAR was reviewed and patient was compliant with medications.  Sleep hours not charted.   Psychiatry team made following the recommendations yesterday: IVC first eval completed 12-02-21. IVC served to pt 12-02-21. 24 hour eval completed 12-02-21.   FORCED MED ORDER Started 1/5  - 1/12!!!! - Geodon 80mg  BID PO or 20mg  IM if refuses PO - Zyprexa 5mg  PO QHS or 5mg  IM if refuses PO   Regular scheduled meds -Continue Geodon 80 mg BID -Continue Restoril 30 mg QHS -Continue Depakote to ER 750mg   -(1/9) VA level: 117, CBC-WNL, hepatic function panel-AST 12  -Increase Zyprexa  to 5 mg daily and 10mg  QHS -Continue Amantadine 100mg  BID, EPS prophylaxis  Objectively patient appears disorganized, but more goal oriented than yesterday. Patient appears to be concerned with her morning ADL's. Patient is still by staff with inappropriate affect on the unit and is difficult to understand.   Patient reports that she is sleeping "ok" and eating well. Patient talked about jobs she has had in the past and is able to communicate that she is aware that her life is different because of her schizophrenia dx. Patient denies SI, HI and endorses AVH but does not give specifics about what she is seeing or hearing.  Principal Problem: Schizoaffective disorder, bipolar type (Paula Dickson) Diagnosis: Principal Problem:   Schizoaffective disorder, bipolar type (Paula Dickson) Active Problems:   Hyperthyroidism   Benign essential HTN   AKI (acute kidney injury) (Paula Dickson)   Cognitive  dysfunction in chronic schizophrenia (Paula Dickson)  Total Time spent with patient: 20 minutes  Past Psychiatric History: See H&P Past Medical History:  Past Medical History:  Diagnosis Date   Bipolar affective disorder (Paula Dickson)    Bipolar disorder (Paula Dickson)    History of arthritis    History of chicken pox    History of depression    History of genital warts    history of heart murmur    History of high blood pressure    History of thyroid disease    History of UTI    Hypertension    Low TSH level 07/13/2017   Schizophrenia (Paula Dickson)     Past Surgical History:  Procedure Laterality Date   ABLATION ON ENDOMETRIOSIS     CYST REMOVAL NECK     around 11 years ago /benign   MULTIPLE TOOTH EXTRACTIONS     Family History:  Family History  Problem Relation Age of Onset   Arthritis Father    Hyperlipidemia Father    High blood pressure Father    Diabetes Sister    Diabetes Mother    Diabetes Brother    Mental illness Brother    Alcohol abuse Paternal Uncle    Alcohol abuse Paternal Grandfather    Breast cancer Maternal Aunt    Breast cancer Paternal Aunt    High blood pressure Sister    Mental illness Other        runs in family   Family Psychiatric  History: See H&P Social History:  Social History   Substance and Sexual Activity  Alcohol Use Not Currently  Social History   Substance and Sexual Activity  Drug Use Not Currently    Social History   Socioeconomic History   Marital status: Single    Spouse name: Not on file   Number of children: Not on file   Years of education: Not on file   Highest education level: Patient refused  Occupational History   Not on file  Tobacco Use   Smoking status: Never   Smokeless tobacco: Never  Vaping Use   Vaping Use: Never used  Substance and Sexual Activity   Alcohol use: Not Currently   Drug use: Not Currently   Sexual activity: Not Currently  Other Topics Concern   Not on file  Social History Narrative   ** Merged History  Encounter **       Social Determinants of Health   Financial Resource Strain: Not on file  Food Insecurity: Not on file  Transportation Needs: Not on file  Physical Activity: Not on file  Stress: Not on file  Social Connections: Not on file   Additional Social History:                         Sleep: Fair  Appetite:  Good  Current Medications: Current Facility-Administered Medications  Medication Dose Route Frequency Provider Last Rate Last Admin   acetaminophen (TYLENOL) tablet 650 mg  650 mg Oral Q6H PRN Ethelene Hal, NP   650 mg at 11/27/21 2347   alum & mag hydroxide-simeth (MAALOX/MYLANTA) 200-200-20 MG/5ML suspension 30 mL  30 mL Oral Q4H PRN Ethelene Hal, NP       Derrill Memo ON 12/08/2021] amantadine (SYMMETREL) capsule 100 mg  100 mg Oral Daily Massengill, Nathan, MD       diclofenac Sodium (VOLTAREN) 1 % topical gel 2 g  2 g Topical QID Damita Dunnings B, MD   2 g at 12/07/21 1709   divalproex (DEPAKOTE ER) 24 hr tablet 750 mg  750 mg Oral Q2000 Buck Mcaffee, Joyce Gross B, MD   750 mg at 12/06/21 2036   docusate sodium (COLACE) capsule 100 mg  100 mg Oral Daily Damita Dunnings B, MD   100 mg at 12/07/21 4656   magnesium hydroxide (MILK OF MAGNESIA) suspension 30 mL  30 mL Oral Daily PRN Ethelene Hal, NP       melatonin tablet 5 mg  5 mg Oral QHS Hill, Jackie Plum, MD   5 mg at 12/06/21 2035   methimazole (TAPAZOLE) tablet 5 mg  5 mg Oral BID Massengill, Ovid Curd, MD   5 mg at 12/07/21 0927   OLANZapine (ZYPREXA) tablet 10 mg  10 mg Oral Q2000 Damita Dunnings B, MD   10 mg at 12/06/21 2035   Or   OLANZapine (ZYPREXA) injection 10 mg  10 mg Intramuscular Q2000 Damita Dunnings B, MD       OLANZapine (ZYPREXA) tablet 5 mg  5 mg Oral Daily Damita Dunnings B, MD   5 mg at 12/07/21 8127   Or   OLANZapine (ZYPREXA) injection 5 mg  5 mg Intramuscular Daily Damita Dunnings B, MD       OLANZapine zydis (ZYPREXA) disintegrating tablet 5 mg  5 mg Oral Q8H PRN Massengill,  Ovid Curd, MD   5 mg at 12/01/21 1203   And   ziprasidone (GEODON) injection 20 mg  20 mg Intramuscular PRN Massengill, Ovid Curd, MD       pantoprazole (PROTONIX) EC tablet 20 mg  20 mg Oral Daily  Rosezetta Schlatter, MD   20 mg at 12/07/21 9371   propranolol ER (INDERAL LA) 24 hr capsule 60 mg  60 mg Oral Daily Massengill, Ovid Curd, MD   60 mg at 12/07/21 6967   temazepam (RESTORIL) capsule 30 mg  30 mg Oral QHS Hill, Jackie Plum, MD   30 mg at 12/06/21 2035   white petrolatum (VASELINE) gel   Topical PRN Rosezetta Schlatter, MD   Given at 12/04/21 1516   ziprasidone (GEODON) capsule 80 mg  80 mg Oral BID WC Massengill, Ovid Curd, MD   80 mg at 12/07/21 1708   Or   ziprasidone (GEODON) injection 20 mg  20 mg Intramuscular BID WC Massengill, Ovid Curd, MD        Lab Results:  Results for orders placed or performed during the hospital encounter of 11/23/21 (from the past 48 hour(s))  CBC with Differential/Platelet     Status: None   Collection Time: 12/05/21  6:31 PM  Result Value Ref Range   WBC 5.3 4.0 - 10.5 K/uL   RBC 4.21 3.87 - 5.11 MIL/uL   Hemoglobin 12.2 12.0 - 15.0 g/dL   HCT 38.4 36.0 - 46.0 %   MCV 91.2 80.0 - 100.0 fL   MCH 29.0 26.0 - 34.0 pg   MCHC 31.8 30.0 - 36.0 g/dL   RDW 12.9 11.5 - 15.5 %   Platelets 228 150 - 400 K/uL   nRBC 0.0 0.0 - 0.2 %   Neutrophils Relative % 51 %   Neutro Abs 2.8 1.7 - 7.7 K/uL   Lymphocytes Relative 31 %   Lymphs Abs 1.6 0.7 - 4.0 K/uL   Monocytes Relative 9 %   Monocytes Absolute 0.5 0.1 - 1.0 K/uL   Eosinophils Relative 8 %   Eosinophils Absolute 0.4 0.0 - 0.5 K/uL   Basophils Relative 1 %   Basophils Absolute 0.0 0.0 - 0.1 K/uL   Immature Granulocytes 0 %   Abs Immature Granulocytes 0.01 0.00 - 0.07 K/uL    Comment: Performed at [facility], [place] [address] [place], [place] [address]  Troponin I (High Sensitivity)     Status: None   Collection Time: 12/05/21  6:31 PM  Result Value Ref Range   Troponin I (High Sensitivity)  3 <18 ng/L    Comment: (NOTE) Elevated high sensitivity troponin I (hsTnI) values and significant  changes across serial measurements may suggest ACS but many other  chronic and acute conditions are known to elevate hsTnI results.  Refer to the "Links" section for chest pain algorithms and additional  guidance. Performed at [facility], [place] [address], [place], [place] [address]   C-reactive protein     Status: None   Collection Time: 12/05/21  6:31 PM  Result Value Ref Range   CRP 0.7 <1.0 mg/dL    Comment: Performed at [facility], [place] [address], [place], [place] [address]  Sedimentation rate     Status: None   Collection Time: 12/05/21  6:31 PM  Result Value Ref Range   Sed Rate 8 0 - 22 mm/hr    Comment: Performed at [facility], [place] [address] [place], [place] [address]  Brain natriuretic peptide     Status: None   Collection Time: 12/05/21  6:31 PM  Result Value Ref Range   B Natriuretic Peptide 8.6 0.0 - 100.0 pg/mL    Comment: Performed at [facility], [place] [address] [place], [place] [address]  Basic metabolic panel     Status: Abnormal   Collection Time: 12/05/21  6:31 PM  Result Value Ref Range   Sodium 143 135 - 145 mmol/L   Potassium 4.3 3.5 - 5.1 mmol/L   Chloride 108 98 - 111 mmol/L   CO2 26 22 - 32 mmol/L   Glucose, Bld 108 (H) 70 - 99 mg/dL    Comment: Glucose reference range applies only to samples taken after fasting for at least 8 hours.   BUN 28 (H) 6 - 20 mg/dL   Creatinine, Ser 1.60 (H) 0.44 - 1.00 mg/dL   Calcium 10.2 8.9 - 10.3 mg/dL   GFR, Estimated 37 (L) >60 mL/min    Comment: (NOTE) Calculated using the CKD-EPI Creatinine Equation (2021)    Anion gap 9 5 - 15    Comment: Performed at [facility], [place] [address], [place], [place] [address]  TSH     Status: Abnormal   Collection Time: 12/05/21  6:31 PM  Result Value Ref Range   TSH 5.984 (H) 0.350 - 4.500  uIU/mL    Comment: Performed by a 3rd Generation assay with a functional sensitivity of <=0.01 uIU/mL. Performed at [facility], [place] [address] [place], [place] [address]   T4, free     Status: None   Collection Time: 12/05/21  6:31 PM  Result Value Ref Range   Free T4 0.69 0.61 - 1.12 ng/dL    Comment: (NOTE) Biotin ingestion may interfere with free T4 tests. If the results are inconsistent with the TSH level, previous test results, or the clinical presentation, then consider biotin interference. If needed, order repeat testing after stopping biotin. Performed at [facility], [place] [address], [place], [place] [address]   Basic metabolic panel     Status: Abnormal   Collection Time: 12/07/21  6:37 AM  Result Value Ref Range   Sodium 143 135 - 145 mmol/L   Potassium 4.0 3.5 - 5.1 mmol/L   Chloride 108 98 - 111 mmol/L   CO2 25 22 - 32 mmol/L   Glucose, Bld 102 (H) 70 - 99 mg/dL    Comment: Glucose reference range applies only to samples taken after fasting for at least 8 hours.   BUN 24 (H) 6 - 20 mg/dL   Creatinine, Ser 1.29 (H) 0.44 - 1.00 mg/dL   Calcium 10.6 (H) 8.9 - 10.3 mg/dL   GFR, Estimated 48 (L) >60 mL/min    Comment: (NOTE) Calculated using the CKD-EPI Creatinine Equation (2021)    Anion gap 10 5 - 15    Comment: Performed at [facility], [place] [address], [place], [place] [address]    Blood Alcohol level:  Lab Results  Component Value Date   [facility] <10 11/22/2021   ETH <10 53/97/6734    Metabolic Disorder Labs: Lab Results  Component Value Date   HGBA1C 6.1 (H) 11/24/2021   MPG 128.37 11/24/2021   MPG 119.76 03/28/2021   Lab Results  Component Value Date   PROLACTIN 11.0 03/14/2019   Lab Results  Component Value Date   CHOL 127 11/24/2021   TRIG 161 (H) 11/24/2021   HDL 39 (L) 11/24/2021   CHOLHDL 3.3 11/24/2021   VLDL 32 11/24/2021   LDLCALC 56 11/24/2021   LDLCALC 95 10/26/2020    Physical  Findings: AIMS: Facial and Oral Movements Muscles of Facial Expression: None, normal Lips and Perioral Area: None, normal Jaw: None, normal Tongue: None, normal,Extremity  Movements Upper (arms, wrists, hands, fingers): None, normal Lower (legs, knees, ankles, toes): None, normal, Trunk Movements Neck, shoulders, hips: None, normal, Overall Severity Severity of abnormal movements (highest score from questions above): None, normal Incapacitation due to abnormal movements: None, normal Patient's awareness of abnormal movements (rate only patient's report): No Awareness, Dental Status Current problems with teeth and/or dentures?: No Does patient usually wear dentures?: No  CIWA:    COWS:     Musculoskeletal: Strength & Muscle Tone: within normal limits Gait & Station: normal Patient leans: N/A  Psychiatric Specialty Exam:  Presentation  General Appearance: Casual  Eye Contact:Fair  Speech:Clear and Coherent  Speech Volume:Normal  Handedness:Right   Mood and Affect  Mood:Irritable  Affect:Congruent   Thought Process  Thought Processes:Disorganized (more goal oriented than yesterday)  Descriptions of Associations:Circumstantial  Orientation:Full (Time, Place and Person)  Thought Content:Delusions  History of Schizophrenia/Schizoaffective disorder:Yes  Duration of Psychotic Symptoms:Greater than six months  Hallucinations:Hallucinations: None  Ideas of Reference:None  Suicidal Thoughts:Suicidal Thoughts: No  Homicidal Thoughts:Homicidal Thoughts: No   Sensorium  Memory:Immediate Fair; Recent Poor  Judgment:Impaired  Insight:None   Executive Functions  Concentration:Poor  Attention Span:Poor  Recall:Poor  Fund of Knowledge:Poor  Language:Poor   Psychomotor Activity  Psychomotor Activity:Psychomotor Activity: -- (patient denies tremor,)   Assets  Assets:Resilience   Sleep  Sleep:Sleep: Fair (5.5)    Physical Exam: Physical  Exam Constitutional:      Appearance: Normal appearance.  HENT:     Head: Normocephalic and atraumatic.  Pulmonary:     Effort: Pulmonary effort is normal.  Neurological:     Mental Status: She is alert and oriented to person, place, and time.   Review of Systems  Psychiatric/Behavioral:  Positive for hallucinations. Negative for suicidal ideas.   Blood pressure 139/87, pulse 82, temperature (!) 97.4 F (36.3 C), temperature source Oral, resp. rate 18, height 5' (1.524 m), weight 76.7 kg, SpO2 100 %. Body mass index is 33.01 kg/m.   Treatment Plan Summary: Daily contact with patient to assess and evaluate symptoms and progress in treatment and Medication management Lamerle is a 56 yo patient w/ PPH of schizophrenia. Patinet's disorganization and behavior appear to have gotten worse around the time amantadine was started. Will decrease this. Patient is becoming more goal oriented and appropriate in the AM, but does appear to decompensate some during the day, but is not requiring PRNs for agitation. ACT to eval patient tom.    Schizoaffective Disorder- Bipolar type: IVC first eval completed 12-02-21. IVC served to pt 12-02-21. 24 hour eval completed 12-02-21.   FORCED MED ORDER Started 1/5  - 1/12!!!! - Geodon 80mg  BID PO or 20mg  IM if refuses PO - Zyprexa 5mg  PO QHS or 5mg  IM if refuses PO   Regular scheduled meds -Continue Geodon 80 mg BID -Continue Restoril 30 mg QHS -Continue Depakote to ER 750mg   -(1/9) VA level: 117, CBC-WNL, hepatic function panel-AST 12  -Increase Zyprexa  to 5 mg daily and 10mg  QHS -Decrease Amantadine 100mg  daily, EPS prophylaxis    HTN   Tachycardia: -Change propranolol from 10 mg tid to ER 60 mg once daily  -Continue to hold Losartan 25 mg daily - Creatinine 1.42> 1.60>1.29   AKI: Will push PO fluids, re-check in morning again -Daily BMP -Encourage PO fluid intake: Gatorade and H2O   Hyperthyroidism: Spoke with hospitalist (1/3)  -Continue  methimazole at 5 mg bid  -TSH (12/29)- 6.847>>> 12.328 (1/3)>>>6.397 (12-02-21) -Free T4 0.84 wnl (12-02-21)  -Follow  up with PCP for further management   Constipation - Colace 100mg    Other PRNs:  -Continue Protonix 20 mg daily -Continue PRN's: Tylenol, Maalox, Atarax, Milk of Magnesia, Trazodone  Toe pain - Volatren gel QID     PGY-2 Freida Busman, MD 12/07/2021, 5:38 PM

## 2021-12-07 NOTE — Progress Notes (Signed)
Pt continued to refuse PO medications, pt given IM Zyprexa per MAR. Pt accepted IM without issue

## 2021-12-07 NOTE — Progress Notes (Signed)
°   12/07/21 1500  Psych Admission Type (Psych Patients Only)  Admission Status Involuntary  Psychosocial Assessment  Patient Complaints Restlessness  Eye Contact Fair  Facial Expression Animated  Affect Euphoric  Speech Incoherent;Pressured;Rapid;Slurred;Word salad  Interaction Attention-seeking;Assertive;Dominating  Motor Activity Shuffling  Appearance/Hygiene Improved  Thought Process  Coherency Disorganized  Content Religiosity  Delusions Religious  Perception Hallucinations  Hallucination Auditory;Visual  Judgment Poor  Confusion Moderate  Danger to Self  Current suicidal ideation? Denies  Danger to Others  Danger to Others None reported or observed

## 2021-12-08 DIAGNOSIS — F250 Schizoaffective disorder, bipolar type: Secondary | ICD-10-CM | POA: Diagnosis not present

## 2021-12-08 LAB — BASIC METABOLIC PANEL
Anion gap: 9 (ref 5–15)
BUN: 24 mg/dL — ABNORMAL HIGH (ref 6–20)
CO2: 26 mmol/L (ref 22–32)
Calcium: 10.1 mg/dL (ref 8.9–10.3)
Chloride: 107 mmol/L (ref 98–111)
Creatinine, Ser: 1.29 mg/dL — ABNORMAL HIGH (ref 0.44–1.00)
GFR, Estimated: 48 mL/min — ABNORMAL LOW (ref >60)
Glucose, Bld: 105 mg/dL — ABNORMAL HIGH (ref 70–99)
Potassium: 4.2 mmol/L (ref 3.5–5.1)
Sodium: 142 mmol/L (ref 135–145)

## 2021-12-08 NOTE — BHH Group Notes (Signed)
Adult Psychoeducational Group Note  Date:  12/08/2021 Time:  9:28 AM  Group Topic/Focus:  Goals Group:   The focus of this group is to help patients establish daily goals to achieve during treatment and discuss how the patient can incorporate goal setting into their daily lives to aide in recovery.  Participation Level:  Did Not Attend    Paula Dickson 12/08/2021, 9:28 AM

## 2021-12-08 NOTE — [facility] Suicide Risk Assessment (Incomplete)
[facility] Discharge Suicide Risk Assessment   Principal Problem: Schizoaffective disorder, bipolar type [facility]) Discharge Diagnoses: Principal Problem:   Schizoaffective disorder, bipolar type ([place]) Active Problems:   Hyperthyroidism   Benign essential HTN   AKI (acute kidney injury) ([place])   Cognitive dysfunction in chronic schizophrenia ([place])   Total Time spent with patient: 54 minutes  Paula Dickson is a 56 yr old female who presents with bizarre behaviors and nonsensical speech after medication noncompliance.  PPHx is significant for Schizoaffective Disorder- Bipolar type complicated by medication noncompliance and previous suicide attempt (OD in 2018), multiple hospitalizations (latest 11/21 [facility]).  During the patient's hospitalization, patient had extensive initial psychiatric evaluation, and follow-up psychiatric evaluations every day.  Psychiatric diagnoses provided upon initial assessment:  #Schizoaffective disorder- Bipolar type  Patient's psychiatric medications were adjusted on admission:  -Discontinue Zyprexa 10 mg qHS  -Discontinue Lorazepam 1 mg TID - pt was not taking as prescribed (maybe one dose/day at bedtime). Monitor for w/d symptoms.  -Initiate Geodon 40 BID.  - Initiate Restoril 15 mg qHS for insomnia  During the hospitalization, other adjustments were made to the patient's psychiatric medication regimen:  -Geodon was increased to 80 mg bid with food -restoril was increased to 30 mg qhs -olanzapine was started and increased to 5 mg in the morning and 10 mg at bedtime -Zyprexa was started and the dose changed to ER 750 mg at bedtime (dose was 1,250 mg at bedtime, but VA was elevated and the dose was adjusted down to address this).  -methimazole dose was changed to 5 mg bid - for hyperthyroid  -propranolol was started and changed to ER 60 mg once daily  -amantadine was started, and the dose changed to 100 mg once daily, for EPS  Gradually, patient started adjusting to milieu.    Patient's care was discussed during the interdisciplinary team meeting every day during the hospitalization.  The patient denied having side effects to prescribed psychiatric medication.  The patient reports their target psychiatric symptoms of psychosis, disorganized thoughts, and paranoia, all responded to the psychiatric medications, and the patient reports overall benefit other psychiatric hospitalization. Staff reports that with psychiatric treatment, pt's mental status has returned to her previous baseline. Supportive psychotherapy was provided to the patient. The patient also participated in regular group therapy while admitted.   Labs were reviewed with the patient, and abnormal results were discussed with the patient.  The patient denied having suicidal thoughts more than 48 hours prior to discharge.  Patient denies having homicidal thoughts.  Patient denies having auditory hallucinations.  Patient denies any visual hallucinations.  Patient denies having paranoid thoughts.  The patient is able to verbalize their individual safety plan to this provider.  It is recommended to the patient to continue psychiatric medications as prescribed, after discharge from the hospital.    It is recommended to the patient to follow up with your outpatient psychiatric provider and PCP.  Discussed with the patient, the impact of alcohol, drugs, tobacco have been there overall psychiatric and medical wellbeing, and total abstinence from substance use was recommended the patient.      Musculoskeletal: Strength & Muscle Tone: within normal limits Gait & Station: normal Patient leans: N/A  Psychiatric Specialty Exam  Presentation  General Appearance: Casual; Fairly Groomed  Eye Contact:Good  Speech:-- (rambling)  Speech Volume:Normal  Handedness:Right   Mood and Affect  Mood:Euthymic  Duration of Depression Symptoms: No data recorded Affect:Congruent; Full Range   Thought Process   Thought Processes:-- (linear  at times, tangential at times)  Descriptions of Associations:Tangential  Orientation:Full (Time, Place and Person)  Thought Content:Logical; Scattered  History of Schizophrenia/Schizoaffective disorder:Yes  Duration of Psychotic Symptoms:Greater than six months  Hallucinations:Hallucinations: None  Ideas of Reference:None  Suicidal Thoughts:Suicidal Thoughts: No  Homicidal Thoughts:Homicidal Thoughts: No   Sensorium  Memory:Immediate Fair; Recent Fair; Remote Fair  Judgment:Fair  Insight:Fair   Executive Functions  Concentration:Poor  Attention Span:Poor  Recall:Poor  Fund of Knowledge:Fair  Language:Fair   Psychomotor Activity  Psychomotor Activity:Psychomotor Activity: Normal   Assets  Assets:Resilience   Sleep  Sleep:Sleep: Good   Physical Exam: Physical Exam Vitals reviewed.  Constitutional:      General: She is not in acute distress.    Appearance: She is normal weight. She is not toxic-appearing.  Pulmonary:     Effort: Pulmonary effort is normal.  Neurological:     Mental Status: She is alert.     Motor: No weakness.     Gait: Gait normal.   Review of Systems  Constitutional:  Negative for chills and fever.  Cardiovascular:  Negative for chest pain and palpitations.  Neurological:  Negative for dizziness, tingling, tremors and headaches.  Psychiatric/Behavioral:  Negative for depression, hallucinations, memory loss, substance abuse and suicidal ideas. The patient is not nervous/anxious and does not have insomnia.   All other systems reviewed and are negative. Blood pressure (!) 129/109, pulse 93, temperature 97.9 F (36.6 C), temperature source Oral, resp. rate 18, height 5' (1.524 m), weight 76.7 kg, SpO2 97 %. Body mass index is 33.01 kg/m.  Mental Status Per Nursing Assessment::   On Admission:  NA  Demographic factors:  Low socioeconomic status, Living alone Loss Factors:  Financial problems /  change in socioeconomic status Historical Factors:  Impulsivity; medication non-compliance Risk Reduction Factors:  Religious beliefs about death  Continued Clinical Symptoms:  Schizophrenia:   Paranoid or undifferentiated type  Cognitive Features That Contribute To Risk:  Loss of executive function    Suicide Risk:  Minimal: No identifiable suicidal ideation.  Patients presenting with no risk factors but with morbid ruminations; may be classified as minimal risk based on the severity of the depressive symptoms   Follow-up Information     [name] Go on 12/20/2021.   Why: You have a hospital follow up appointment for therapy and medication management services on  12/20/21 at 9:30 am.   This appointment will be held in person. Contact information: [place]  [place] [address] [phone]         [facility] Follow up.   Why: An ACTT referral has been made on your behalf. Please call to follow up at discharge to establish services. Contact information: [place] [address] [phone]         [facility]. Go on 01/06/2022.   Why: You have a hospital follow up appointment for primary care services on 01/06/22 at 9:15 am.  This appointment will be held in person. Contact information: [place] [phone]                Plan Of Care/Follow-up recommendations:   Activity: as tolerated  Diet: heart healthy  Other: -Follow-up with your outpatient psychiatric provider -instructions on appointment date, time, and address (location) are provided to you in discharge paperwork.  -Take your psychiatric medications as prescribed at discharge - instructions are provided to you in the discharge paperwork  -Follow-up  with outpatient primary care doctor and other specialists -for management of chronic medical disease, including:  thyroid disease, blood pressure management, and acute kidney injury versus chronic kidney disease  -Testing: Follow-up with outpatient provider for lab results:  12-09-2021: BMP: *** VA level:  12-05-2021: FT4: 0.84 TSH: 5.984   -Recommend abstinence from alcohol, tobacco, and other illicit drug use at discharge.   -If your psychiatric symptoms recur, worsen, or if you have side effects to your psychiatric medications, call your outpatient psychiatric provider, [phone] or go to the nearest emergency department.  -If suicidal thoughts occur, call your outpatient psychiatric provider, 911, [phone] or go to the nearest emergency department.   Christoper Allegra, MD 12/08/2021, 4:02 PM

## 2021-12-08 NOTE — Progress Notes (Signed)
Patient came into the dayroom and interrupted this author's conversation with another patient. The patient wants to be transferred to [facility] but did  not give a reason as she quickly switched topics. The patient also verbalized that she is calm and therefore does not require any medication at this time. Patient is refusing to talk about her day and gets upset easily when this Pryor Curia attempts to speak up.

## 2021-12-08 NOTE — Progress Notes (Signed)
°   12/08/21 1600  Psych Admission Type (Psych Patients Only)  Admission Status Involuntary  Psychosocial Assessment  Patient Complaints Irritability  Eye Contact Fair  Facial Expression Animated  Affect Euphoric  Speech Incoherent;Pressured;Rapid;Slurred;Word salad;Argumentative  Interaction Attention-seeking;Assertive;Dominating  Motor Activity Shuffling  Appearance/Hygiene Improved  Behavior Characteristics Resistant to care  Mood Labile;Preoccupied  Thought Process  Coherency Disorganized  Content Religiosity  Delusions Religious  Perception Hallucinations  Hallucination Auditory;Visual  Judgment Poor  Confusion Moderate  Danger to Self  Current suicidal ideation? Denies  Danger to Others  Danger to Others None reported or observed

## 2021-12-08 NOTE — Group Note (Signed)
Recreation Therapy Group Note   Group Topic:Coping Skills  Group Date: 12/08/2021 Start Time: 3704 End Time: 1030 Facilitators: [name], LRT,CTRS Location: [address] Dayroom   Goal Area(s) Addresses: Patient will define what a coping skill is. Patient will successfully identify positive coping skills they can use post d/c.  Patient will acknowledge benefit(s) of using learned coping skills post d/c.   Group Description: Coping A to Z. Patient asked to identify what a coping skill is and when they use them. Patients with Probation officer discussed healthy versus unhealthy coping skills. Next patients were given a blank worksheet titled "Coping Skills A-Z". Patients were instructed to come up with at least one positive coping skill per letter of the alphabet. Patients were given 15 minutes to complete task, before ideas were presented to the large group. Patients and LRT debriefed on the importance of coping skill selection based on situation and back-up plans when a skill tried is not effective.    Affect/Mood: N/A   Participation Level: Did not attend    Clinical Observations/Individualized Feedback:     Plan: Continue to engage patient in RT group sessions 2-3x/week.   [name], LRT,CTRS 12/08/2021 11:59 AM

## 2021-12-08 NOTE — Progress Notes (Signed)
Pt continues to be disorganized and argumentative, pt continues to refuse PO medications    12/08/21 2000  Psych Admission Type (Psych Patients Only)  Admission Status Involuntary  Psychosocial Assessment  Patient Complaints Suspiciousness;Irritability;Agitation  Eye Contact Fair  Facial Expression Animated  Affect Euphoric  Speech Incoherent;Pressured;Rapid;Slurred;Word salad;Argumentative  Interaction Attention-seeking;Assertive;Dominating  Motor Activity Shuffling  Appearance/Hygiene Improved  Behavior Characteristics Agressive verbally;Irritable;Posturing;Resistant to care  Mood Labile;Suspicious;Preoccupied  Thought Process  Coherency Disorganized  Content Religiosity  Delusions Religious  Perception Hallucinations  Hallucination Auditory;Visual  Judgment Poor  Confusion Moderate  Danger to Self  Current suicidal ideation? Denies  Danger to Others  Danger to Others None reported or observed

## 2021-12-08 NOTE — Progress Notes (Signed)
[facility] MD Progress Note  12/08/2021 3:28 PM Paula Dickson  MRN:  [id number] Subjective:  Paula Dickson is a 56 yr old female who presents with bizarre behaviors and nonsensical speech after medication noncompliance.  PPHx is significant for Schizoaffective Disorder- Bipolar type complicated by medication noncompliance and previous suicide attempt (OD in 2018), multiple hospitalizations (latest 11/21 [facility]).   Case was discussed in the multidisciplinary team.  MAR was reviewed and patient was semi-compliant with medications.  Patient refused her QHS PO medications, but received her IM QHS with no problems.    Psychiatry team made following the recommendations yesterday: IVC first eval completed 12-02-21. IVC served to pt 12-02-21. 24 hour eval completed 12-02-21.   FORCED MED ORDER Started 1/5  - 1/12!!!! - Geodon 80mg  BID PO or 20mg  IM if refuses PO - Zyprexa 5mg  PO QHS or 5mg  IM if refuses PO   Regular scheduled meds -Continue Geodon 80 mg BID -Continue Restoril 30 mg QHS -Continue Depakote to ER 750mg   -(1/9) VA level: 117, CBC-WNL, hepatic function panel-AST 12  -Increase Zyprexa  to 5 mg daily and 10mg  QHS -Decrease Amantadine 100mg  daily, EPS prophylaxis  Patient attempts to talk about being concerned for the well being of others today. Patient reports that she was a bit offended this AM when someone ? Asked if she had been bathing. Patient is adamant that she has good hygiene and also talks about how she tried to keep her room clean. Patient later begins rambling about her family and living by her faith. Patient endorses that she knows she is supposed to take her medication and stay hydrated. Patient also endorses that she is a bit upset about having her daily blood draws and has reports she has been doing her best to improve her kidney function. Patient denies SI and HI. Patient says "probably" when asked about AVH, but does not give details.     Principal Problem: Schizoaffective disorder,  bipolar type ([place]) Diagnosis: Principal Problem:   Schizoaffective disorder, bipolar type ([place]) Active Problems:   Hyperthyroidism   Benign essential HTN   AKI (acute kidney injury) ([place])   Cognitive dysfunction in chronic schizophrenia ([place])  Total Time spent with patient: 15 minutes  Past Psychiatric History: See H&P  Past Medical History:  Past Medical History:  Diagnosis Date   Bipolar affective disorder ([place])    Bipolar disorder ([name])    History of arthritis    History of chicken pox    History of depression    History of genital warts    history of heart murmur    History of high blood pressure    History of thyroid disease    History of UTI    Hypertension    Low TSH level 07/13/2017   Schizophrenia ([place])     Past Surgical History:  Procedure Laterality Date   ABLATION ON ENDOMETRIOSIS     CYST REMOVAL NECK     around 11 years ago /benign   MULTIPLE TOOTH EXTRACTIONS     Family History:  Family History  Problem Relation Age of Onset   Arthritis Father    Hyperlipidemia Father    High blood pressure Father    Diabetes Sister    Diabetes Mother    Diabetes Brother    Mental illness Brother    Alcohol abuse Paternal Uncle    Alcohol abuse Paternal Grandfather    Breast cancer Maternal Aunt    Breast cancer Paternal Aunt    High blood  pressure Sister    Mental illness Other        runs in family   Family Psychiatric  History: See H&P Social History:  Social History   Substance and Sexual Activity  Alcohol Use Not Currently     Social History   Substance and Sexual Activity  Drug Use Not Currently    Social History   Socioeconomic History   Marital status: Single    Spouse name: Not on file   Number of children: Not on file   Years of education: Not on file   Highest education level: Patient refused  Occupational History   Not on file  Tobacco Use   Smoking status: Never   Smokeless tobacco: Never  Vaping Use   Vaping Use: Never used   Substance and Sexual Activity   Alcohol use: Not Currently   Drug use: Not Currently   Sexual activity: Not Currently  Other Topics Concern   Not on file  Social History Narrative   ** Merged History Encounter **       Social Determinants of Health   Financial Resource Strain: Not on file  Food Insecurity: Not on file  Transportation Needs: Not on file  Physical Activity: Not on file  Stress: Not on file  Social Connections: Not on file   Additional Social History:                         Sleep: Poor  Appetite:  Good  Current Medications: Current Facility-Administered Medications  Medication Dose Route Frequency Provider Last Rate Last Admin   acetaminophen (TYLENOL) tablet 650 mg  650 mg Oral Q6H PRN Ethelene Hal, NP   650 mg at 11/27/21 2347   alum & mag hydroxide-simeth (MAALOX/MYLANTA) 200-200-20 MG/5ML suspension 30 mL  30 mL Oral Q4H PRN Ethelene Hal, NP       amantadine (SYMMETREL) capsule 100 mg  100 mg Oral Daily Massengill, Nathan, MD       diclofenac Sodium (VOLTAREN) 1 % topical gel 2 g  2 g Topical QID Damita Dunnings B, MD   2 g at 12/07/21 1709   divalproex (DEPAKOTE ER) 24 hr tablet 750 mg  750 mg Oral Q2000 Vang Kraeger B, MD   750 mg at 12/06/21 2036   docusate sodium (COLACE) capsule 100 mg  100 mg Oral Daily Damita Dunnings B, MD   100 mg at 12/07/21 5732   magnesium hydroxide (MILK OF MAGNESIA) suspension 30 mL  30 mL Oral Daily PRN Ethelene Hal, NP       melatonin tablet 5 mg  5 mg Oral QHS Hill, Jackie Plum, MD   5 mg at 12/06/21 2035   methimazole (TAPAZOLE) tablet 5 mg  5 mg Oral BID Massengill, Ovid Curd, MD   5 mg at 12/07/21 0927   OLANZapine (ZYPREXA) tablet 10 mg  10 mg Oral Q2000 Damita Dunnings B, MD   10 mg at 12/06/21 2035   Or   OLANZapine (ZYPREXA) injection 10 mg  10 mg Intramuscular Q2000 Damita Dunnings B, MD   10 mg at 12/07/21 2103   OLANZapine (ZYPREXA) tablet 5 mg  5 mg Oral Daily Damita Dunnings B, MD    5 mg at 12/07/21 2025   Or   OLANZapine (ZYPREXA) injection 5 mg  5 mg Intramuscular Daily Damita Dunnings B, MD   5 mg at 12/08/21 0938   OLANZapine zydis (ZYPREXA) disintegrating tablet 5 mg  5  mg Oral Q8H PRN Janine Limbo, MD   5 mg at 12/01/21 1203   And   ziprasidone (GEODON) injection 20 mg  20 mg Intramuscular PRN Massengill, Ovid Curd, MD       pantoprazole (PROTONIX) EC tablet 20 mg  20 mg Oral Daily Rosezetta Schlatter, MD   20 mg at 12/07/21 1660   propranolol ER (INDERAL LA) 24 hr capsule 60 mg  60 mg Oral Daily Massengill, Ovid Curd, MD   60 mg at 12/07/21 6301   temazepam (RESTORIL) capsule 30 mg  30 mg Oral QHS Hill, Jackie Plum, MD   30 mg at 12/06/21 2035   white petrolatum (VASELINE) gel   Topical PRN Rosezetta Schlatter, MD   Given at 12/04/21 1516   ziprasidone (GEODON) capsule 80 mg  80 mg Oral BID WC Massengill, Ovid Curd, MD   80 mg at 12/07/21 6010   Or   ziprasidone (GEODON) injection 20 mg  20 mg Intramuscular BID WC Massengill, Ovid Curd, MD   20 mg at 12/08/21 9323    Lab Results:  Results for orders placed or performed during the hospital encounter of 11/23/21 (from the past 48 hour(s))  Basic metabolic panel     Status: Abnormal   Collection Time: 12/07/21  6:37 AM  Result Value Ref Range   Sodium 143 135 - 145 mmol/L   Potassium 4.0 3.5 - 5.1 mmol/L   Chloride 108 98 - 111 mmol/L   CO2 25 22 - 32 mmol/L   Glucose, Bld 102 (H) 70 - 99 mg/dL    Comment: Glucose reference range applies only to samples taken after fasting for at least 8 hours.   BUN 24 (H) 6 - 20 mg/dL   Creatinine, Ser 1.29 (H) 0.44 - 1.00 mg/dL   Calcium 10.6 (H) 8.9 - 10.3 mg/dL   GFR, Estimated 48 (L) >60 mL/min    Comment: (NOTE) Calculated using the CKD-EPI Creatinine Equation (2021)    Anion gap 10 5 - 15    Comment: Performed at [facility], [place] [address] [place], [place] [address]  Basic metabolic panel     Status: Abnormal   Collection Time: 12/08/21  7:00 AM   Result Value Ref Range   Sodium 142 135 - 145 mmol/L   Potassium 4.2 3.5 - 5.1 mmol/L   Chloride 107 98 - 111 mmol/L   CO2 26 22 - 32 mmol/L   Glucose, Bld 105 (H) 70 - 99 mg/dL    Comment: Glucose reference range applies only to samples taken after fasting for at least 8 hours.   BUN 24 (H) 6 - 20 mg/dL   Creatinine, Ser 1.29 (H) 0.44 - 1.00 mg/dL   Calcium 10.1 8.9 - 10.3 mg/dL   GFR, Estimated 48 (L) >60 mL/min    Comment: (NOTE) Calculated using the CKD-EPI Creatinine Equation (2021)    Anion gap 9 5 - 15    Comment: Performed at [facility], [place] [address], [place], [place] [address]    Blood Alcohol level:  Lab Results  Component Value Date   [facility] <10 11/22/2021   ETH <10 27/04/2375    Metabolic Disorder Labs: Lab Results  Component Value Date   HGBA1C 6.1 (H) 11/24/2021   MPG 128.37 11/24/2021   MPG 119.76 03/28/2021   Lab Results  Component Value Date   PROLACTIN 11.0 03/14/2019   Lab Results  Component Value Date   CHOL 127 11/24/2021   TRIG 161 (H) 11/24/2021  HDL 39 (L) 11/24/2021   CHOLHDL 3.3 11/24/2021   VLDL 32 11/24/2021   LDLCALC 56 11/24/2021   LDLCALC 95 10/26/2020    Physical Findings: AIMS: Facial and Oral Movements Muscles of Facial Expression: None, normal Lips and Perioral Area: None, normal Jaw: None, normal Tongue: None, normal,Extremity Movements Upper (arms, wrists, hands, fingers): None, normal Lower (legs, knees, ankles, toes): None, normal, Trunk Movements Neck, shoulders, hips: None, normal, Overall Severity Severity of abnormal movements (highest score from questions above): None, normal Incapacitation due to abnormal movements: None, normal Patient's awareness of abnormal movements (rate only patient's report): No Awareness, Dental Status Current problems with teeth and/or dentures?: No Does patient usually wear dentures?: No  CIWA:    COWS:     Musculoskeletal: Strength & Muscle Tone: within  normal limits Gait & Station: normal Patient leans: N/A  Psychiatric Specialty Exam:  Presentation  General Appearance: Appropriate for Environment; Casual  Eye Contact:Good  Speech:Clear and Coherent; Pressured  Speech Volume:Normal  Handedness:Right   Mood and Affect  Mood:-- (overall pleasant)  Affect:Congruent   Thought Process  Thought Processes:Disorganized  Descriptions of Associations:Tangential  Orientation:Full (Time, Place and Person)  Thought Content:Tangential  History of Schizophrenia/Schizoaffective disorder:Yes  Duration of Psychotic Symptoms:Greater than six months  Hallucinations:Hallucinations: Auditory; Visual ("probably")  Ideas of Reference:None  Suicidal Thoughts:Suicidal Thoughts: No  Homicidal Thoughts:Homicidal Thoughts: No   Sensorium  Memory:Immediate Good; Recent Fair  Judgment:-- (Improving)  Insight:Shallow   Executive Functions  Concentration:Poor  Attention Span:Poor  [place]   Psychomotor Activity  Psychomotor Activity:Psychomotor Activity: Normal   Assets  Assets:Resilience   Sleep  Sleep:Sleep: Poor    Physical Exam: Physical Exam Constitutional:      Appearance: Normal appearance.  HENT:     Head: Normocephalic and atraumatic.  Pulmonary:     Effort: Pulmonary effort is normal.  Neurological:     Mental Status: She is alert and oriented to person, place, and time.   Review of Systems  Psychiatric/Behavioral:  Negative for suicidal ideas.   Blood pressure (!) 129/109, pulse 93, temperature 97.9 F (36.6 C), temperature source Oral, resp. rate 18, height 5' (1.524 m), weight 76.7 kg, SpO2 97 %. Body mass index is 33.01 kg/m.   Treatment Plan Summary: Daily contact with patient to assess and evaluate symptoms and progress in treatment and Medication management Paula Dickson is a 56 yo patient w/ PPH of schizophrenia. Patient continues to appear  disorganized, but is a bit more goal oriented than on initial presentation. Patient is appropriately concerned about her ADLs and will be engaging with the ACT today. Patient behavior has been fairly close to her baseline over the past 24 hrs. Despite patient's poor sleep she is pacing less than last week, and endorses attempts to "rest" more.   Schizoaffective Disorder- Bipolar type: IVC first eval completed 12-02-21. IVC served to pt 12-02-21. 24 hour eval completed 12-02-21.   FORCED MED ORDER Started 1/5  - 1/12!!!! - Geodon 80mg  BID PO or 20mg  IM if refuses PO - Zyprexa 5mg  PO QHS or 5mg  IM if refuses PO   Regular scheduled meds -Continue Geodon 80 mg BID -Continue Restoril 30 mg QHS -Continue Depakote to ER 750mg   -(1/9) VA level: 117, CBC-WNL, hepatic function panel-AST 12  -Increase Zyprexa  to 5 mg daily and 10mg  QHS -Decrease Amantadine 100mg  daily, EPS prophylaxis     HTN   Tachycardia: -Change propranolol from 10 mg tid to ER 60 mg once  daily  -Continue to hold Losartan 25 mg daily - Creatinine 1.42> 1.60>1.29>1.29   AKI- improving, stable: Will push PO fluids - Discontinue Daily BMP -Encourage PO fluid intake: Gatorade and H2O   Hyperthyroidism: Spoke with hospitalist (1/3)  -Continue methimazole at 5 mg bid  -TSH (12/29)- 6.847>>> 12.328 (1/3)>>>6.397 (12-02-21) -Free T4 0.84 wnl (12-02-21)  -Follow up with PCP for further management   Constipation - Colace 100mg    Other PRNs:  -Continue Protonix 20 mg daily -Continue PRN's: Tylenol, Maalox, Atarax, Milk of Magnesia, Trazodone   Toe pain - Volatren gel QID   PGY-2 Freida Busman, MD 12/08/2021, 3:28 PM

## 2021-12-08 NOTE — Progress Notes (Signed)
°   12/08/21 0545  Sleep  Number of Hours 3.5

## 2021-12-09 ENCOUNTER — Inpatient Hospital Stay (HOSPITAL_COMMUNITY)
Admit: 2021-12-09 | Discharge: 2021-12-09 | Disposition: A | Discharge status: Home / Self Care | Payer: 59 | Attending: Psychiatry | Admitting: Psychiatry

## 2021-12-09 ENCOUNTER — Encounter (HOSPITAL_COMMUNITY): Payer: Self-pay

## 2021-12-09 DIAGNOSIS — F250 Schizoaffective disorder, bipolar type: Secondary | ICD-10-CM | POA: Diagnosis not present

## 2021-12-09 LAB — CBC WITH DIFFERENTIAL/PLATELET
Abs Immature Granulocytes: 0.02 10*3/uL (ref 0.00–0.07)
Basophils Absolute: 0 10*3/uL (ref 0.0–0.1)
Basophils Relative: 1 %
Eosinophils Absolute: 0.3 10*3/uL (ref 0.0–0.5)
Eosinophils Relative: 4 %
HCT: 38.7 % (ref 36.0–46.0)
Hemoglobin: 12.5 g/dL (ref 12.0–15.0)
Immature Granulocytes: 0 %
Lymphocytes Relative: 31 %
Lymphs Abs: 2.2 10*3/uL (ref 0.7–4.0)
MCH: 29.2 pg (ref 26.0–34.0)
MCHC: 32.3 g/dL (ref 30.0–36.0)
MCV: 90.4 fL (ref 80.0–100.0)
Monocytes Absolute: 0.7 10*3/uL (ref 0.1–1.0)
Monocytes Relative: 10 %
Neutro Abs: 3.9 10*3/uL (ref 1.7–7.7)
Neutrophils Relative %: 54 %
Platelets: 223 10*3/uL (ref 150–400)
RBC: 4.28 MIL/uL (ref 3.87–5.11)
RDW: 12.8 % (ref 11.5–15.5)
WBC: 7.1 10*3/uL (ref 4.0–10.5)
nRBC: 0 % (ref 0.0–0.2)

## 2021-12-09 LAB — COMPREHENSIVE METABOLIC PANEL
ALT: 14 U/L (ref 0–44)
AST: 21 U/L (ref 15–41)
Albumin: 4 g/dL (ref 3.5–5.0)
Alkaline Phosphatase: 64 U/L (ref 38–126)
Anion gap: 3 — ABNORMAL LOW (ref 5–15)
BUN: 22 mg/dL — ABNORMAL HIGH (ref 6–20)
CO2: 26 mmol/L (ref 22–32)
Calcium: 9.6 mg/dL (ref 8.9–10.3)
Chloride: 107 mmol/L (ref 98–111)
Creatinine, Ser: 1.52 mg/dL — ABNORMAL HIGH (ref 0.44–1.00)
GFR, Estimated: 40 mL/min — ABNORMAL LOW (ref >60)
Glucose, Bld: 113 mg/dL — ABNORMAL HIGH (ref 70–99)
Potassium: 4.1 mmol/L (ref 3.5–5.1)
Sodium: 136 mmol/L (ref 135–145)
Total Bilirubin: 0.8 mg/dL (ref 0.3–1.2)
Total Protein: 7.1 g/dL (ref 6.5–8.1)

## 2021-12-09 LAB — VALPROIC ACID LEVEL: Valproic Acid Lvl: 14 ug/mL — ABNORMAL LOW (ref 50.0–100.0)

## 2021-12-09 MED ORDER — OLANZAPINE 10 MG IM SOLR: 5.0000 mg | Freq: Once | INTRAMUSCULAR | Status: DC | PRN

## 2021-12-09 MED ORDER — POLYETHYLENE GLYCOL 3350 17 G PO PACK
17.0000 g | PACK | Freq: Every day | ORAL | Status: DC
  Administered 2021-12-09 – 2021-12-20 (×9): 17 g via ORAL
  Filled 2021-12-09 (×18): qty 1

## 2021-12-09 MED ORDER — DONEPEZIL HCL 5 MG PO TABS
5.0000 mg | ORAL_TABLET | Freq: Every day | ORAL | Status: DC
  Administered 2021-12-09: 5 mg via ORAL
  Filled 2021-12-09 (×6): qty 1

## 2021-12-09 MED ORDER — OLANZAPINE 10 MG PO TABS
10.0000 mg | ORAL_TABLET | Freq: Every day | ORAL | Status: DC
  Administered 2021-12-09: 10 mg via ORAL
  Filled 2021-12-09 (×3): qty 1

## 2021-12-09 MED ORDER — OLANZAPINE 10 MG IM SOLR
5.0000 mg | Freq: Every day | INTRAMUSCULAR | Status: DC
  Filled 2021-12-09 (×3): qty 10

## 2021-12-09 MED ORDER — AMANTADINE HCL 100 MG PO CAPS
100.0000 mg | ORAL_CAPSULE | ORAL | Status: DC
  Administered 2021-12-11 – 2021-12-13 (×2): 100 mg via ORAL
  Filled 2021-12-09 (×3): qty 1

## 2021-12-09 MED ORDER — OLANZAPINE 10 MG IM SOLR
5.0000 mg | Freq: Every day | INTRAMUSCULAR | Status: DC
  Filled 2021-12-09: qty 10

## 2021-12-09 MED ORDER — OLANZAPINE 5 MG PO TABS
5.0000 mg | ORAL_TABLET | Freq: Every day | ORAL | Status: DC
  Filled 2021-12-09: qty 1

## 2021-12-09 NOTE — Progress Notes (Signed)
Pt up at the nursing station arguing with staff, pt yelling hard to redirect, pt continues to be labile and being loud .

## 2021-12-09 NOTE — Progress Notes (Signed)
°   12/09/21 0545  Sleep  Number of Hours 4

## 2021-12-09 NOTE — Progress Notes (Signed)
Pt irritable, and labile this evening, pt did comply with taking medications PO, continues to be disorganized and confused at times.     12/09/21 2000  Psych Admission Type (Psych Patients Only)  Admission Status Involuntary  Psychosocial Assessment  Patient Complaints Anxiety;Irritability  Eye Contact Fair  Facial Expression Animated  Affect Euphoric  Speech Incoherent;Pressured;Rapid;Slurred;Word salad;Argumentative  Interaction Attention-seeking;Assertive;Dominating  Motor Activity Shuffling  Appearance/Hygiene Improved  Behavior Characteristics Resistant to care  Mood Labile;Suspicious;Preoccupied  Thought Process  Coherency Disorganized  Content Religiosity  Delusions Religious  Perception Hallucinations  Hallucination Auditory;Visual  Judgment Poor  Confusion Moderate  Danger to Self  Current suicidal ideation? Denies  Danger to Others  Danger to Others None reported or observed

## 2021-12-09 NOTE — Progress Notes (Signed)
Pt came out her room yelling and screaming waking up other patients. Pt sitting by the phone yelling at staff, not being able to be redirected. Pt tried to Customer service manager. Pt escorted back to her room, pt given IM Zyprexa that she refused earlier.

## 2021-12-09 NOTE — Group Note (Signed)
Recreation Therapy Group Note   Group Topic:Team Building  Group Date: 12/09/2021 Start Time: 1000 End Time: 1020 Facilitators: [name], LRT,CTRS Location: [address] Dayroom   Goal Area(s) Addresses:  Patient will effectively work with peer towards shared goal.  Patient will identify skills used to make activity successful.  Patient will identify how skills used during activity can be used to reach post d/c goals.    Group Description:  Landing Pad. In teams of 3-5, patients were given 12 plastic drinking straws and an equal length of masking tape. Using the materials provided, patients were asked to build a landing pad to catch a golf ball dropped from approximately 5 feet in the air. All materials were required to be used by the team in their design. LRT facilitated post-activity discussion.   Affect/Mood: N/A   Participation Level: Did not attend    Clinical Observations/Individualized Feedback:     Plan: Continue to engage patient in RT group sessions 2-3x/week.   [name], LRT,CTRS 12/09/2021 12:10 PM

## 2021-12-09 NOTE — Progress Notes (Signed)
[facility] Second Physician Opinion Progress Note for Medication Administration to Non-consenting Patients (For Involuntarily Committed Patients)  Patient: Paula Dickson Date of Birth: [id number] MRN: [id number]  Reason for the Medication: The patient, without the benefit of the specific treatment measure, is incapable of participating in any available treatment plan that will give the patient a realistic opportunity of improving the patient's condition. There is, without the benefit of the specific treatment measure, a significant possibility that the patient will harm self or others before improvement of the patient's condition is realized.  Consideration of Side Effects: Consideration of the side effects related to the medication plan has been given.  Rationale for Medication Administration: Paula Dickson is unable to appreciate her mental illness. She denies needing any medication due to refusal to acknowledge illness. She is currently non-compliant with medication and unable to meaningfully participate in any form of treatment. She is unable to appreciate the risks and benefits of untreated mental illness at this time.  Agree with retrial of forced medications.     Maida Sale, MD 12/09/21  11:12 AM   This documentation is good for (7) seven days from the date of the MD signature. New documentation must be completed every seven (7) days with detailed justification in the medical record if the patient requires continued non-emergent administration of psychotropic medications.

## 2021-12-09 NOTE — BHH Counselor (Signed)
CSW witnessed this patient yelling at [facility] staff when asking for toilet paper. [facility] provided toilet paper, this patient began to scream at [facility] and was aggressive posturing with raised fists towards [facility] staff. Pt made comment that she sees demons and that they are real just as she sees staff members as demons.     Darletta Moll MSW, LCSW Clincal Social Worker  [facility]

## 2021-12-09 NOTE — BH IP Treatment Plan (Signed)
Interdisciplinary Treatment and Diagnostic Plan Update  12/09/2021 Paula Dickson MRN: [id number]  Principal Diagnosis: Schizoaffective disorder, bipolar type [facility])  Secondary Diagnoses: Principal Problem:   Schizoaffective disorder, bipolar type ([place]) Active Problems:   Hyperthyroidism   Benign essential HTN   AKI (acute kidney injury) ([place])   Cognitive dysfunction in chronic schizophrenia ([place])   Current Medications:  Current Facility-Administered Medications  Medication Dose Route Frequency Provider Last Rate Last Admin   acetaminophen (TYLENOL) tablet 650 mg  650 mg Oral Q6H PRN Ethelene Hal, NP   650 mg at 11/27/21 2347   alum & mag hydroxide-simeth (MAALOX/MYLANTA) 200-200-20 MG/5ML suspension 30 mL  30 mL Oral Q4H PRN Ethelene Hal, NP       amantadine (SYMMETREL) capsule 100 mg  100 mg Oral Daily Massengill, Ovid Curd, MD   100 mg at 12/09/21 1497   diclofenac Sodium (VOLTAREN) 1 % topical gel 2 g  2 g Topical QID Damita Dunnings B, MD   2 g at 12/09/21 0842   divalproex (DEPAKOTE ER) 24 hr tablet 750 mg  750 mg Oral Q2000 Damita Dunnings B, MD   750 mg at 12/06/21 2036   docusate sodium (COLACE) capsule 100 mg  100 mg Oral Daily Damita Dunnings B, MD   100 mg at 12/07/21 0263   magnesium hydroxide (MILK OF MAGNESIA) suspension 30 mL  30 mL Oral Daily PRN Ethelene Hal, NP       melatonin tablet 5 mg  5 mg Oral QHS Hill, Jackie Plum, MD   5 mg at 12/06/21 2035   methimazole (TAPAZOLE) tablet 5 mg  5 mg Oral BID Massengill, Ovid Curd, MD   5 mg at 12/09/21 0835   OLANZapine (ZYPREXA) tablet 10 mg  10 mg Oral Q2000 Damita Dunnings B, MD   10 mg at 12/06/21 2035   Or   OLANZapine (ZYPREXA) injection 10 mg  10 mg Intramuscular Q2000 Damita Dunnings B, MD   10 mg at 12/09/21 0246   OLANZapine (ZYPREXA) tablet 5 mg  5 mg Oral Daily Damita Dunnings B, MD   5 mg at 12/09/21 0834   Or   OLANZapine (ZYPREXA) injection 5 mg  5 mg Intramuscular Daily Damita Dunnings B, MD    5 mg at 12/08/21 0938   OLANZapine zydis (ZYPREXA) disintegrating tablet 5 mg  5 mg Oral Q8H PRN Massengill, Ovid Curd, MD   5 mg at 12/01/21 1203   And   ziprasidone (GEODON) injection 20 mg  20 mg Intramuscular PRN Massengill, Ovid Curd, MD       pantoprazole (PROTONIX) EC tablet 20 mg  20 mg Oral Daily Rosezetta Schlatter, MD   20 mg at 12/09/21 0836   propranolol ER (INDERAL LA) 24 hr capsule 60 mg  60 mg Oral Daily Massengill, Ovid Curd, MD   60 mg at 12/09/21 0833   temazepam (RESTORIL) capsule 30 mg  30 mg Oral QHS Hill, Jackie Plum, MD   30 mg at 12/06/21 2035   white petrolatum (VASELINE) gel   Topical PRN Rosezetta Schlatter, MD   Given at 12/04/21 1516   ziprasidone (GEODON) capsule 80 mg  80 mg Oral BID WC Massengill, Ovid Curd, MD   80 mg at 12/09/21 7858   Or   ziprasidone (GEODON) injection 20 mg  20 mg Intramuscular BID WC Massengill, Ovid Curd, MD   20 mg at 12/08/21 0936   PTA Medications: Medications Prior to Admission  Medication Sig Dispense Refill Last Dose   losartan (COZAAR) 25 MG  tablet Take 1 tablet (25 mg total) by mouth daily. 30 tablet 0 Past Month   methimazole (TAPAZOLE) 10 MG tablet Take 1 tablet (10 mg total) by mouth 2 (two) times daily. 60 tablet 2 Past Month   OLANZapine (ZYPREXA) 10 MG tablet Take 10 mg by mouth at bedtime.   Past Month   pantoprazole (PROTONIX) 20 MG tablet Take 20 mg by mouth daily.   Past Month   propranolol (INDERAL) 20 MG tablet Take 1 tablet (20 mg total) by mouth 2 (two) times daily. (Patient taking differently: Take 10 mg by mouth 2 (two) times daily.) 60 tablet 0 Past Month   LORazepam (ATIVAN) 1 MG tablet Take 1 mg by mouth 3 (three) times daily.       Patient Stressors: Health problems   Medication change or noncompliance    Patient Strengths: Motivation for treatment/growth  Supportive family/friends   Treatment Modalities: Medication Management, Group therapy, Case management,  1 to 1 session with clinician, Psychoeducation, Recreational  therapy.   Physician Treatment Plan for Primary Diagnosis: Schizoaffective disorder, bipolar type ([place]) Long Term Goal(s): Improvement in symptoms so as ready for discharge   Short Term Goals: Ability to identify changes in lifestyle to reduce recurrence of condition will improve Ability to verbalize feelings will improve Ability to demonstrate self-control will improve Ability to identify and develop effective coping behaviors will improve Ability to maintain clinical measurements within normal limits will improve Compliance with prescribed medications will improve  Medication Management: Evaluate patient's response, side effects, and tolerance of medication regimen.  Therapeutic Interventions: 1 to 1 sessions, Unit Group sessions and Medication administration.  Evaluation of Outcomes: Not Met  Physician Treatment Plan for Secondary Diagnosis: Principal Problem:   Schizoaffective disorder, bipolar type ([place]) Active Problems:   Hyperthyroidism   Benign essential HTN   AKI (acute kidney injury) ([place])   Cognitive dysfunction in chronic schizophrenia ([place])  Long Term Goal(s): Improvement in symptoms so as ready for discharge   Short Term Goals: Ability to identify changes in lifestyle to reduce recurrence of condition will improve Ability to verbalize feelings will improve Ability to demonstrate self-control will improve Ability to identify and develop effective coping behaviors will improve Ability to maintain clinical measurements within normal limits will improve Compliance with prescribed medications will improve     Medication Management: Evaluate patient's response, side effects, and tolerance of medication regimen.  Therapeutic Interventions: 1 to 1 sessions, Unit Group sessions and Medication administration.  Evaluation of Outcomes: Not Met   RN Treatment Plan for Primary Diagnosis: Schizoaffective disorder, bipolar type ([place]) Long Term Goal(s): Knowledge of disease  and therapeutic regimen to maintain health will improve  Short Term Goals: Ability to remain free from injury will improve, Ability to participate in decision making will improve, and Ability to verbalize feelings will improve  Medication Management: RN will administer medications as ordered by provider, will assess and evaluate patient's response and provide education to patient for prescribed medication. RN will report any adverse and/or side effects to prescribing provider.  Therapeutic Interventions: 1 on 1 counseling sessions, Psychoeducation, Medication administration, Evaluate responses to treatment, Monitor vital signs and CBGs as ordered, Perform/monitor CIWA, COWS, AIMS and Fall Risk screenings as ordered, Perform wound care treatments as ordered.  Evaluation of Outcomes: Not Progressing   LCSW Treatment Plan for Primary Diagnosis: Schizoaffective disorder, bipolar type ([place]) Long Term Goal(s): Safe transition to appropriate next level of care at discharge, Engage patient in therapeutic group addressing interpersonal concerns.  Short Term Goals: Engage patient in aftercare planning with referrals and resources, Increase social support, and Increase ability to appropriately verbalize feelings  Therapeutic Interventions: Assess for all discharge needs, 1 to 1 time with Social worker, Explore available resources and support systems, Assess for adequacy in community support network, Educate family and significant other(s) on suicide prevention, Complete Psychosocial Assessment, Interpersonal group therapy.  Evaluation of Outcomes: Not Progressing   Progress in Treatment: Attending groups: No. Participating in groups: No. Taking medication as prescribed: No. Toleration medication: No. Family/Significant other contact made: Yes, individual(s) contacted:  sister Patient understands diagnosis: No. Discussing patient identified problems/goals with staff: No. Medical problems stabilized  or resolved: Yes. Denies suicidal/homicidal ideation: Yes. Issues/concerns per patient self-inventory: No. Other: None  New problem(s) identified: No, Describe:  None  New Short Term/Long Term Goal(s):medication stabilization, elimination of SI thoughts, development of comprehensive mental wellness plan.   Patient Goals:  Declined to participate   Discharge Plan or Barriers: CSW made an ACTT referral for pt. An APS report has been made for pt.  Reason for Continuation of Hospitalization: Delusions  Hallucinations Mania Medication stabilization  Estimated Length of Stay: 3-5 days   Scribe for Treatment Team: Eliott Nine 12/09/2021 12:10 PM

## 2021-12-09 NOTE — Progress Notes (Addendum)
[facility] MD Progress Note  12/09/2021 1:40 PM Paula Dickson  MRN:  [id number] Subjective:  Paula Dickson is a 56 yr old female who presents with bizarre behaviors and nonsensical speech after medication noncompliance.  PPHx is significant for Schizoaffective Disorder- Bipolar type complicated by medication noncompliance and previous suicide attempt (OD in 2018), multiple hospitalizations (latest 11/21 [facility]).   Case was discussed in the multidisciplinary team.  MAR was reviewed Patient refused her QHS PO medications and did not receive IM. Patient was more irritable overnight and this AM. Patient was screaming yesterday and required IM medications during the day.   Psychiatry team made following the recommendations yesterday: -Continue Geodon 80 mg BID -Continue Restoril 30 mg QHS -Continue Depakote to ER 750mg   -(1/9) VA level: 117, CBC-WNL, hepatic function panel-AST 12  -Continue Zyprexa  to 5 mg daily and 10mg  QHS -Decrease Amantadine 100mg  daily, EPS prophylaxis -Change propranolol from 10 mg tid to ER 60 mg once daily   On assessment this AM patient is angry and irritable. Patient looks angry when patient see's provider and reports that she likes her "quiet time." Patient reports that she wants to be able to think. Patient is able to deny SI, HI and says "probably" when asked about AVH. Patient then kicked Provider out of her room.   Objectively patient has been noted by multiple staff and patients to be very disruptive on the unit. Patient screams when she is upset scaring other patients. Patient has been heard screaming when she refuses her medications and requires IM medications. Patient yells at staff trying to assist her and is kicking people out of her room. Patient has been telling some staff members that she is having pain in her lower abdomen and has throughout her disorganized speech, mentioned she is not having bowel movements. Patient has begun refusing Colace. Patient was offered  prune juice because she kept saying "I will naturally go" however patient only took a few sips before becoming irritated.   Update later today: This provider was leaving another patient's room, when patient was walking down the hall. Patient was telling other staff that she was "leaving." When staff told patient she was not leaving she picked up her belongings and turned around and saw provider coming down the hall. Patient began screaming and shaking her bag and began walking towards provider and yelling at provider. Staff had to step between provider and patient, patient continued to walk towards her room.   Principal Problem: Schizoaffective disorder, bipolar type ([place]) Diagnosis: Principal Problem:   Schizoaffective disorder, bipolar type ([place]) Active Problems:   Hyperthyroidism   Benign essential HTN   AKI (acute kidney injury) ([place])   Cognitive dysfunction in chronic schizophrenia ([place])  Total Time spent with patient: 30 minutes  Past Psychiatric History: See H&P  Past Medical History:  Past Medical History:  Diagnosis Date   Bipolar affective disorder ([place])    Bipolar disorder ([place])    History of arthritis    History of chicken pox    History of depression    History of genital warts    history of heart murmur    History of high blood pressure    History of thyroid disease    History of UTI    Hypertension    Low TSH level 07/13/2017   Schizophrenia ([place])     Past Surgical History:  Procedure Laterality Date   ABLATION ON ENDOMETRIOSIS     CYST REMOVAL NECK     around  11 years ago /benign   MULTIPLE TOOTH EXTRACTIONS     Family History:  Family History  Problem Relation Age of Onset   Arthritis Father    Hyperlipidemia Father    High blood pressure Father    Diabetes Sister    Diabetes Mother    Diabetes Brother    Mental illness Brother    Alcohol abuse Paternal Uncle    Alcohol abuse Paternal Grandfather    Breast cancer Maternal Aunt    Breast cancer  Paternal Aunt    High blood pressure Sister    Mental illness Other        runs in family   Family Psychiatric  History: See H&P Social History:  Social History   Substance and Sexual Activity  Alcohol Use Not Currently     Social History   Substance and Sexual Activity  Drug Use Not Currently    Social History   Socioeconomic History   Marital status: Single    Spouse name: Not on file   Number of children: Not on file   Years of education: Not on file   Highest education level: Patient refused  Occupational History   Not on file  Tobacco Use   Smoking status: Never   Smokeless tobacco: Never  Vaping Use   Vaping Use: Never used  Substance and Sexual Activity   Alcohol use: Not Currently   Drug use: Not Currently   Sexual activity: Not Currently  Other Topics Concern   Not on file  Social History Narrative   ** Merged History Encounter **       Social Determinants of Health   Financial Resource Strain: Not on file  Food Insecurity: Not on file  Transportation Needs: Not on file  Physical Activity: Not on file  Stress: Not on file  Social Connections: Not on file   Additional Social History:                         Sleep: Poor  Appetite:  Fair  Current Medications: Current Facility-Administered Medications  Medication Dose Route Frequency Provider Last Rate Last Admin   acetaminophen (TYLENOL) tablet 650 mg  650 mg Oral Q6H PRN Ethelene Hal, NP   650 mg at 11/27/21 2347   alum & mag hydroxide-simeth (MAALOX/MYLANTA) 200-200-20 MG/5ML suspension 30 mL  30 mL Oral Q4H PRN Ethelene Hal, NP       [START ON 12/11/2021] amantadine (SYMMETREL) capsule 100 mg  100 mg Oral QODAY Jerney Baksh, MD       diclofenac Sodium (VOLTAREN) 1 % topical gel 2 g  2 g Topical QID Damita Dunnings B, MD   2 g at 12/09/21 0842   divalproex (DEPAKOTE ER) 24 hr tablet 750 mg  750 mg Oral Q2000 McQuilla, Jai B, MD   750 mg at 12/06/21 2036    magnesium hydroxide (MILK OF MAGNESIA) suspension 30 mL  30 mL Oral Daily PRN Ethelene Hal, NP       melatonin tablet 5 mg  5 mg Oral QHS Hill, Jackie Plum, MD   5 mg at 12/06/21 2035   methimazole (TAPAZOLE) tablet 5 mg  5 mg Oral BID Buster Schueller, Ovid Curd, MD   5 mg at 12/09/21 0835   OLANZapine (ZYPREXA) tablet 5 mg  5 mg Oral Daily Damita Dunnings B, MD   5 mg at 12/09/21 0834   Or   OLANZapine (ZYPREXA) injection 5 mg  5 mg  Intramuscular Daily Damita Dunnings B, MD   5 mg at 12/08/21 0938   OLANZapine (ZYPREXA) tablet 5 mg  5 mg Oral Q2000 Yashua Bracco, MD       Or   OLANZapine (ZYPREXA) injection 5 mg  5 mg Intramuscular Q2000 Charrisse Masley, Ovid Curd, MD       OLANZapine (ZYPREXA) injection 5 mg  5 mg Intramuscular Once PRN Alistar Mcenery, Ovid Curd, MD       OLANZapine zydis (ZYPREXA) disintegrating tablet 5 mg  5 mg Oral Q8H PRN Jawad Wiacek, Ovid Curd, MD   5 mg at 12/01/21 1203   And   ziprasidone (GEODON) injection 20 mg  20 mg Intramuscular PRN Charla Criscione, Ovid Curd, MD       pantoprazole (PROTONIX) EC tablet 20 mg  20 mg Oral Daily Rosezetta Schlatter, MD   20 mg at 12/09/21 0836   polyethylene glycol (MIRALAX / GLYCOLAX) packet 17 g  17 g Oral Daily Damita Dunnings B, MD       propranolol ER (INDERAL LA) 24 hr capsule 60 mg  60 mg Oral Daily Irina Okelly, Ovid Curd, MD   60 mg at 12/09/21 0833   temazepam (RESTORIL) capsule 30 mg  30 mg Oral QHS Hill, Jackie Plum, MD   30 mg at 12/06/21 2035   white petrolatum (VASELINE) gel   Topical PRN Rosezetta Schlatter, MD   Given at 12/04/21 1516   ziprasidone (GEODON) capsule 80 mg  80 mg Oral BID WC Django Nguyen, Ovid Curd, MD   80 mg at 12/09/21 5462   Or   ziprasidone (GEODON) injection 20 mg  20 mg Intramuscular BID WC Chaysen Tillman, Ovid Curd, MD   20 mg at 12/08/21 7035    Lab Results:  Results for orders placed or performed during the hospital encounter of 11/23/21 (from the past 48 hour(s))  Basic metabolic panel     Status: Abnormal   Collection Time:  12/08/21  7:00 AM  Result Value Ref Range   Sodium 142 135 - 145 mmol/L   Potassium 4.2 3.5 - 5.1 mmol/L   Chloride 107 98 - 111 mmol/L   CO2 26 22 - 32 mmol/L   Glucose, Bld 105 (H) 70 - 99 mg/dL    Comment: Glucose reference range applies only to samples taken after fasting for at least 8 hours.   BUN 24 (H) 6 - 20 mg/dL   Creatinine, Ser 1.29 (H) 0.44 - 1.00 mg/dL   Calcium 10.1 8.9 - 10.3 mg/dL   GFR, Estimated 48 (L) >60 mL/min    Comment: (NOTE) Calculated using the CKD-EPI Creatinine Equation (2021)    Anion gap 9 5 - 15    Comment: Performed at [facility], [place] [address], [place], [place] [address]    Blood Alcohol level:  Lab Results  Component Value Date   [facility] <10 11/22/2021   ETH <10 18/29/9371    Metabolic Disorder Labs: Lab Results  Component Value Date   HGBA1C 6.1 (H) 11/24/2021   MPG 128.37 11/24/2021   MPG 119.76 03/28/2021   Lab Results  Component Value Date   PROLACTIN 11.0 03/14/2019   Lab Results  Component Value Date   CHOL 127 11/24/2021   TRIG 161 (H) 11/24/2021   HDL 39 (L) 11/24/2021   CHOLHDL 3.3 11/24/2021   VLDL 32 11/24/2021   LDLCALC 56 11/24/2021   LDLCALC 95 10/26/2020    Physical Findings: AIMS: Facial and Oral Movements Muscles of Facial Expression: None, normal Lips and Perioral Area: None, normal Jaw: None, normal Tongue:  None, normal,Extremity Movements Upper (arms, wrists, hands, fingers): None, normal Lower (legs, knees, ankles, toes): None, normal, Trunk Movements Neck, shoulders, hips: None, normal, Overall Severity Severity of abnormal movements (highest score from questions above): None, normal Incapacitation due to abnormal movements: None, normal Patient's awareness of abnormal movements (rate only patient's report): No Awareness, Dental Status Current problems with teeth and/or dentures?: No Does patient usually wear dentures?: No  CIWA:    COWS:     Musculoskeletal: Strength & Muscle  Tone: within normal limits Gait & Station: normal Patient leans: N/A  Psychiatric Specialty Exam:  Presentation  General Appearance: Casual  Eye Contact:Fleeting  Speech:Clear and Coherent  Speech Volume:Increased  Handedness:Right   Mood and Affect  Mood:Angry  Affect:Congruent   Thought Process  Thought Processes:Disorganized  Descriptions of Associations:Circumstantial  Orientation:-- (unable to assess, because patient will not answer questinos but patient knows she is in the hospital)  Thought Content:Tangential  History of Schizophrenia/Schizoaffective disorder:Yes  Duration of Psychotic Symptoms:Greater than six months  Hallucinations:Hallucinations: -- ("probably.")  Ideas of Reference:Paranoia; Delusions  Suicidal Thoughts:Suicidal Thoughts: No  Homicidal Thoughts:Homicidal Thoughts: No   Sensorium  Memory:Immediate Fair  Judgment:Impaired  Insight:None   Executive Functions  Concentration:Poor  Attention Span:Poor  Recall:Poor  Fund of Knowledge:Poor  Language:Poor   Psychomotor Activity  Psychomotor Activity:Psychomotor Activity: Increased   Assets  Assets:Resilience   Sleep  Sleep:Sleep: Poor    Physical Exam: Physical Exam Constitutional:      Appearance: Normal appearance.  HENT:     Head: Normocephalic and atraumatic.  Pulmonary:     Effort: Pulmonary effort is normal.  Neurological:     Mental Status: She is alert.   Review of Systems  Psychiatric/Behavioral:  Negative for suicidal ideas. The patient has insomnia.    Blood pressure (!) 141/97, pulse (!) 131, temperature 97.9 F (36.6 C), temperature source Oral, resp. rate 18, height 5' (1.524 m), weight 76.7 kg, SpO2 100 %. Body mass index is 33.01 kg/m.   Treatment Plan Summary: Daily contact with patient to assess and evaluate symptoms and progress in treatment and Medication management   Patient is not ready for discharge. Patient has been  refusing her medications, requiring IM meds and has become a disruption on the unit scaring other patients. Patient has shown that she is no longer at her baseline and will require a new treatment regimen. She is unable to care for herself due to the severity of her psychiatric illness.  Will continue Geodon as patient is on a maximum dose and medication effectively targets D2 receptors. Patient's Zyprexa will be down- titrated and patient will be started on Clozaril, once constipation resolves. Prior to starting Clozaril would like to evaluate patient's reported constipation. Will start Clozaril if patient is not found to have SBO, has verified bowel movement, and gets repeat EKG.   Schizoaffective Disorder- Bipolar type: IVC first eval completed 12-02-21. IVC served to pt 12-02-21. 24 hour eval completed 12-02-21.   FORCED MED ORDER Started 1/5  - 1/12!!!! Second FORCED MED ORDER 1/13-1/20!!! - Geodon 80mg  BID PO or 20mg  IM if refuses PO - Zyprexa 5mg  PO qam or 5mg  IM if refuses PO; 10 mg qhs or 5 mg IM if refuses PO   Regular scheduled meds -Continue Geodon 80 mg BID -Continue Restoril 30 mg QHS -Continue Depakote ER 750mg   -(1/9) VA level: 117, CBC-WNL, hepatic function panel-AST 12  - follow-up repeat Depakote level, CBC, and CMP -Continue Zyprexa  to 5 mg daily and  10mg  QHS -Decrease Amantadine 100mg  every other day , EPS prophylaxis -Start Aricept 5mg  QHS for cognitive impairment   Expected schedule for Clozaril titration (started after pt has bowel movement, gets EKG, and if pt is still a candidate for clozapine at that time): Day 1: Start Clozaril 12.5mg  QHS, Decrease Zyprexa to 5mg  BID Day 2: Clozaril 12.5mg  BID, Zyprexa 5mg  BID Day 3: Clozaril 12.5 mg daily and 25mg  QHS, Zyprexa 2.5mg  daily and 5mg  QHS Day 4: Clozaril 25mg  daily and 37.5mg  QHS, Zyprexa 2.5mg  QHS Day 5: Clozaril 37.5mg  BID     HTN   Tachycardia: -Continue Propanolol ER 60 mg once daily  -Continue to hold Losartan  25 mg daily - Creatinine 1.42> 1.60>1.29>1.29   AKI- improving, stable: Will push PO fluids - Discontinue Daily BMP -Encourage PO fluid intake: Gatorade and H2O   Hyperthyroidism: Spoke with hospitalist (1/3)  -Continue methimazole at 5 mg bid  -TSH (12/29)- 6.847>>> 12.328 (1/3)>>>6.397 (12-02-21) -Free T4 0.84 wnl (12-02-21)  -Follow up with PCP for further management   Constipation - Discontinue Colace 100mg  - Discontinue Milk of Mag due to renal function  - Start Miralax daily - Order KUB   Other PRNs:  -Continue Protonix 20 mg daily -Continue PRN's: Tylenol, Maalox, Atarax, Milk of Magnesia, Trazodone   Toe pain - Volatren gel QID    PGY-2 Freida Busman, MD 12/09/2021, 1:40 PM  Total Time Spent in Direct Patient Care:  I personally spent 30 minutes on the unit in direct patient care. The direct patient care time included face-to-face time with the patient, reviewing the patient's chart, communicating with other professionals, and coordinating care. Greater than 50% of this time was spent in counseling or coordinating care with the patient regarding goals of hospitalization, psycho-education, and discharge planning needs.  I have independently evaluated the patient during a face-to-face assessment on 12/09/21. I reviewed the patient's chart, and I participated in key portions of the service. I discussed the case with the [employer], and I agree with the assessment and plan of care as documented in the House Officer's note, as addended by me or notated below:  I directly edited the note, as above.   Janine Limbo, MD Psychiatrist

## 2021-12-09 NOTE — [facility] Group Notes (Signed)
Spirituality group facilitated by Chaplain Katy Rodney Wigger, BCC.   Group Description: Group focused on topic of hope. Patients participated in facilitated discussion around topic, connecting with one another around experiences and definitions for hope. Group members engaged with visual explorer photos, reflecting on what hope looks like for them today. Group engaged in discussion around how their definitions of hope are present today in hospital.   Modalities: Psycho-social ed, Adlerian, Narrative, MI   Patient Progress: Did not attend.  

## 2021-12-09 NOTE — Group Note (Signed)
LCSW Group Therapy Note   Group Date: 12/09/2021 Start Time: 1100 End Time: 1200  Type of Therapy and Topic:  Group Therapy:  Stress Management   Participation Level:  Did Not Attend    Description of Group:  Patients in this group were introduced to the idea of stress and encouraged to discuss negative and positive ways to manage stress. Patients discussed specific stressors that they have in their life right now and the physical signs and symptoms associated with that stress.  Patient encouraged to come up with positive changes to assist with the stress upon discharge in order to prevent future hospitalizations.   They also worked as a group on developing a specific plan for several patients to deal with stressors through [place], psychoeducation and self care techniques   Therapeutic Goals:               1)  To discuss the positive and negative impacts of stress             2)  identify signs and symptoms of stress             3)  generate ideas for stress management             4)  offer mutual support to others regarding stress management             5)  Developing plans for ways to manage specific stressors upon discharge               Summary of Patient Progress:  Did not attend   Therapeutic Modalities:   Motivational Interviewing Brief Solution-Focused Therapy
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
## 2021-12-09 NOTE — Progress Notes (Signed)
Pt refused labs.  

## 2021-12-10 DIAGNOSIS — F250 Schizoaffective disorder, bipolar type: Secondary | ICD-10-CM | POA: Diagnosis not present

## 2021-12-10 MED ORDER — TEMAZEPAM 30 MG PO CAPS
30.0000 mg | ORAL_CAPSULE | Freq: Every day | ORAL | Status: DC
  Administered 2021-12-15 – 2021-12-16 (×2): 30 mg via ORAL
  Filled 2021-12-10 (×5): qty 1

## 2021-12-10 MED ORDER — OLANZAPINE 10 MG IM SOLR
5.0000 mg | Freq: Every day | INTRAMUSCULAR | Status: DC
  Administered 2021-12-10: 5 mg via INTRAMUSCULAR
  Filled 2021-12-10: qty 10

## 2021-12-10 MED ORDER — OLANZAPINE 5 MG PO TABS
5.0000 mg | ORAL_TABLET | Freq: Every day | ORAL | Status: DC
  Filled 2021-12-10: qty 1

## 2021-12-10 MED ORDER — LORAZEPAM 2 MG/ML IJ SOLN
1.0000 mg | Freq: Every day | INTRAMUSCULAR | Status: DC
  Administered 2021-12-10 – 2021-12-13 (×3): 1 mg via INTRAVENOUS
  Filled 2021-12-10 (×3): qty 1

## 2021-12-10 NOTE — Progress Notes (Signed)
°   12/10/21 0545  Sleep  Number of Hours 6.5

## 2021-12-10 NOTE — Group Note (Signed)
LCSW Group Therapy Note 12/10/2021  11:15am-12:00pm  Type of Therapy and Topic:  Group Therapy: Anger and Commonalities  Participation Level:  Minimal   Description of Group: In this group, patients initially shared an "unknown" fact about themselves and CSW led a discussion about the ways in which we have things in common without realizing it.  Patient then identified a recent time they became angry and how this yet again showed a way in which they had something in common with other patients.  We discussed possible unhealthy reactions to anger and possible healthy reactions.  We also discussed possible underlying emotions that lead to the anger.  Commonalities among group members were pointed out throughout the entirety of group.  Therapeutic Goals: Patients were asked to share something about themselves and learned that they often have things in common with other people without knowing this Patients will remember an incident of anger and how they reacted Patients will be able to identify their reaction as healthy or unhealthy, and identify possible reactions that would have been the opposite Patients will learn that anger itself is a secondary emotion and will think about their primary emotion at the time of their last incident of anger  Summary of Patient Progress:  The patient shared that something interesting she could share about herself is that she loves to plant flowers.  This was shared in the midst of a number of other, disconnected items.  The patient also said a frequent cause of anger is when people do not listen to her and she became incensed, left the room when CSW asked her to repeat something she said due to not hearing it well.  She did not return to the group.    Therapeutic Modalities:   Cognitive Behavioral Therapy  Maretta Los, LCSW 12/10/2021  2:54 PM

## 2021-12-10 NOTE — Progress Notes (Signed)
[facility] MD Progress Note  12/10/2021 11:14 AM Paula Dickson  MRN:  [id number] Subjective:   Paula Dickson is a 56 yr old female who presents with bizarre behaviors and nonsensical speech after medication noncompliance.  PPHx is significant for Schizoaffective Disorder- Bipolar type complicated by medication noncompliance and previous suicide attempt (OD in 2018), multiple hospitalizations (latest 11/21 [facility]).   Case was discussed in the multidisciplinary team.  MAR was reviewed Patient refused her QHS PO medications and did not receive IM. Patient was more irritable overnight and this AM. Patient was screaming yesterday and required IM medications during the day.   Psychiatry team made following the recommendations yesterday: IVC first eval completed 12-02-21. IVC served to pt 12-02-21. 24 hour eval completed 12-02-21.   FORCED MED ORDER Started 1/5  - 1/12!!!! Second FORCED MED ORDER 1/13-1/20!!! - Geodon 80mg  BID PO or 20mg  IM if refuses PO - Zyprexa 5mg  PO qam or 5mg  IM if refuses PO; 10 mg qhs or 5 mg IM if refuses PO   Regular scheduled meds -Continue Geodon 80 mg BID -Continue Restoril 30 mg QHS -Continue Depakote ER 750mg   -(1/9) VA level: 117, CBC-WNL, hepatic function panel-AST 12  - follow-up repeat Depakote level, CBC, and CMP -Continue Zyprexa  to 5 mg daily and 10mg  QHS -Decrease Amantadine 100mg  every other day , EPS prophylaxis -Start Aricept 5mg  QHS for cognitive impairment    Expected schedule for Clozaril titration (started after pt has bowel movement, gets EKG, and if pt is still a candidate for clozapine at that time): Day 1: Start Clozaril 12.5mg  QHS, Decrease Zyprexa to 5mg  BID Day 2: Clozaril 12.5mg  BID, Zyprexa 5mg  BID Day 3: Clozaril 12.5 mg daily and 25mg  QHS, Zyprexa 2.5mg  daily and 5mg  QHS Day 4: Clozaril 25mg  daily and 37.5mg  QHS, Zyprexa 2.5mg  QHS Day 5: Clozaril 37.5mg  BID   Patient's KUB resulted with moderate stool burden but distended bladder. On assessment  today patient continues to endorse pain in her lower abdomen around her bladder. When patient's results were discussed with her, patient endorsed that she feels that one of her newer medications is making it hard for her to urinate because she has never had this problem before. Patient also communicated that it makes her anxious when providers take written notes while she talks and that when Resident provider comes she wishes she would wear a white coat, to help distinguish, but reports " You are a stylish lady. I am a stylish lady." Patient also endorses being upset that she is still in the hospital and feels that her rights were taken away.  Patient was able to deny SI, HI and VH today. Patient reports that she is having AH from "Allah, the God I serve." Patient denies that she is Muslim and reports that she is [place].   Principal Problem: Schizoaffective disorder, bipolar type ([place]) Diagnosis: Principal Problem:   Schizoaffective disorder, bipolar type ([place]) Active Problems:   Hyperthyroidism   Benign essential HTN   AKI (acute kidney injury) ([place])   Cognitive dysfunction in chronic schizophrenia ([place])  Total Time spent with patient: 20 minutes  Past Psychiatric History: See H&P  Past Medical History:  Past Medical History:  Diagnosis Date   Bipolar affective disorder ([place])    Bipolar disorder ([place])    History of arthritis    History of chicken pox    History of depression    History of genital warts    history of heart murmur    History of  high blood pressure    History of thyroid disease    History of UTI    Hypertension    Low TSH level 07/13/2017   Schizophrenia ([place])     Past Surgical History:  Procedure Laterality Date   ABLATION ON ENDOMETRIOSIS     CYST REMOVAL NECK     around 11 years ago /benign   MULTIPLE TOOTH EXTRACTIONS     Family History:  Family History  Problem Relation Age of Onset   Arthritis Father    Hyperlipidemia Father    High blood pressure  Father    Diabetes Sister    Diabetes Mother    Diabetes Brother    Mental illness Brother    Alcohol abuse Paternal Uncle    Alcohol abuse Paternal Grandfather    Breast cancer Maternal Aunt    Breast cancer Paternal Aunt    High blood pressure Sister    Mental illness Other        runs in family   Family Psychiatric  History:  See H&P Social History:  Social History   Substance and Sexual Activity  Alcohol Use Not Currently     Social History   Substance and Sexual Activity  Drug Use Not Currently    Social History   Socioeconomic History   Marital status: Single    Spouse name: Not on file   Number of children: Not on file   Years of education: Not on file   Highest education level: Patient refused  Occupational History   Not on file  Tobacco Use   Smoking status: Never   Smokeless tobacco: Never  Vaping Use   Vaping Use: Never used  Substance and Sexual Activity   Alcohol use: Not Currently   Drug use: Not Currently   Sexual activity: Not Currently  Other Topics Concern   Not on file  Social History Narrative   ** Merged History Encounter **       Social Determinants of Health   Financial Resource Strain: Not on file  Food Insecurity: Not on file  Transportation Needs: Not on file  Physical Activity: Not on file  Stress: Not on file  Social Connections: Not on file   Additional Social History:                         Sleep: Good  Appetite:  Good  Current Medications: Current Facility-Administered Medications  Medication Dose Route Frequency Provider Last Rate Last Admin   acetaminophen (TYLENOL) tablet 650 mg  650 mg Oral Q6H PRN Ethelene Hal, NP   650 mg at 11/27/21 2347   alum & mag hydroxide-simeth (MAALOX/MYLANTA) 200-200-20 MG/5ML suspension 30 mL  30 mL Oral Q4H PRN Ethelene Hal, NP       [START ON 12/11/2021] amantadine (SYMMETREL) capsule 100 mg  100 mg Oral QODAY Massengill, Nathan, MD       diclofenac  Sodium (VOLTAREN) 1 % topical gel 2 g  2 g Topical QID Damita Dunnings B, MD   2 g at 12/10/21 1012   divalproex (DEPAKOTE ER) 24 hr tablet 750 mg  750 mg Oral Q2000 Nilo Fallin, Joyce Gross B, MD   750 mg at 12/09/21 2035   donepezil (ARICEPT) tablet 5 mg  5 mg Oral QHS Massengill, Nathan, MD   5 mg at 12/09/21 2033   melatonin tablet 5 mg  5 mg Oral QHS Hill, Jackie Plum, MD   5 mg at 12/09/21 2033  methimazole (TAPAZOLE) tablet 5 mg  5 mg Oral BID Massengill, Ovid Curd, MD   5 mg at 12/10/21 1010   OLANZapine (ZYPREXA) tablet 5 mg  5 mg Oral Daily Damita Dunnings B, MD   5 mg at 12/10/21 1011   Or   OLANZapine (ZYPREXA) injection 5 mg  5 mg Intramuscular Daily Damita Dunnings B, MD   5 mg at 12/08/21 0938   OLANZapine (ZYPREXA) tablet 10 mg  10 mg Oral Q2000 Massengill, Ovid Curd, MD   10 mg at 12/09/21 2033   Or   OLANZapine (ZYPREXA) injection 5 mg  5 mg Intramuscular Q2000 Massengill, Ovid Curd, MD       OLANZapine zydis (ZYPREXA) disintegrating tablet 5 mg  5 mg Oral Q8H PRN Massengill, Ovid Curd, MD   5 mg at 12/09/21 1341   And   ziprasidone (GEODON) injection 20 mg  20 mg Intramuscular PRN Massengill, Ovid Curd, MD       pantoprazole (PROTONIX) EC tablet 20 mg  20 mg Oral Daily Rosezetta Schlatter, MD   20 mg at 12/10/21 1012   polyethylene glycol (MIRALAX / GLYCOLAX) packet 17 g  17 g Oral Daily Damita Dunnings B, MD   17 g at 12/10/21 1011   propranolol ER (INDERAL LA) 24 hr capsule 60 mg  60 mg Oral Daily Massengill, Ovid Curd, MD   60 mg at 12/10/21 1010   temazepam (RESTORIL) capsule 30 mg  30 mg Oral QHS Hill, Jackie Plum, MD   30 mg at 12/09/21 2033   white petrolatum (VASELINE) gel   Topical PRN Rosezetta Schlatter, MD   Given at 12/04/21 1516   ziprasidone (GEODON) capsule 80 mg  80 mg Oral BID WC Massengill, Ovid Curd, MD   80 mg at 12/10/21 1009   Or   ziprasidone (GEODON) injection 20 mg  20 mg Intramuscular BID WC Massengill, Ovid Curd, MD   20 mg at 12/09/21 1704    Lab Results:  Results for orders placed or  performed during the hospital encounter of 11/23/21 (from the past 48 hour(s))  CBC with Differential/Platelet     Status: None   Collection Time: 12/09/21  3:05 PM  Result Value Ref Range   WBC 7.1 4.0 - 10.5 K/uL   RBC 4.28 3.87 - 5.11 MIL/uL   Hemoglobin 12.5 12.0 - 15.0 g/dL   HCT 38.7 36.0 - 46.0 %   MCV 90.4 80.0 - 100.0 fL   MCH 29.2 26.0 - 34.0 pg   MCHC 32.3 30.0 - 36.0 g/dL   RDW 12.8 11.5 - 15.5 %   Platelets 223 150 - 400 K/uL   nRBC 0.0 0.0 - 0.2 %   Neutrophils Relative % 54 %   Neutro Abs 3.9 1.7 - 7.7 K/uL   Lymphocytes Relative 31 %   Lymphs Abs 2.2 0.7 - 4.0 K/uL   Monocytes Relative 10 %   Monocytes Absolute 0.7 0.1 - 1.0 K/uL   Eosinophils Relative 4 %   Eosinophils Absolute 0.3 0.0 - 0.5 K/uL   Basophils Relative 1 %   Basophils Absolute 0.0 0.0 - 0.1 K/uL   Immature Granulocytes 0 %   Abs Immature Granulocytes 0.02 0.00 - 0.07 K/uL    Comment: Performed at [facility], [place] [address], [place], [place] [address]  Comprehensive metabolic panel     Status: Abnormal   Collection Time: 12/09/21  3:05 PM  Result Value Ref Range   Sodium 136 135 - 145 mmol/L   Potassium 4.1 3.5 - 5.1  mmol/L   Chloride 107 98 - 111 mmol/L   CO2 26 22 - 32 mmol/L   Glucose, Bld 113 (H) 70 - 99 mg/dL    Comment: Glucose reference range applies only to samples taken after fasting for at least 8 hours.   BUN 22 (H) 6 - 20 mg/dL   Creatinine, Ser 1.52 (H) 0.44 - 1.00 mg/dL   Calcium 9.6 8.9 - 10.3 mg/dL   Total Protein 7.1 6.5 - 8.1 g/dL   Albumin 4.0 3.5 - 5.0 g/dL   AST 21 15 - 41 U/L   ALT 14 0 - 44 U/L   Alkaline Phosphatase 64 38 - 126 U/L   Total Bilirubin 0.8 0.3 - 1.2 mg/dL   GFR, Estimated 40 (L) >60 mL/min    Comment: (NOTE) Calculated using the CKD-EPI Creatinine Equation (2021)    Anion gap 3 (L) 5 - 15    Comment: Performed at [facility], [place] [address], [place], [place] [address]  Valproic acid level     Status:  Abnormal   Collection Time: 12/09/21  3:05 PM  Result Value Ref Range   Valproic Acid Lvl 14 (L) 50.0 - 100.0 ug/mL    Comment: Performed at [facility], [place] [address], [place], [place] [address]    Blood Alcohol level:  Lab Results  Component Value Date   [facility] <10 11/22/2021   ETH <10 28/36/6294    Metabolic Disorder Labs: Lab Results  Component Value Date   HGBA1C 6.1 (H) 11/24/2021   MPG 128.37 11/24/2021   MPG 119.76 03/28/2021   Lab Results  Component Value Date   PROLACTIN 11.0 03/14/2019   Lab Results  Component Value Date   CHOL 127 11/24/2021   TRIG 161 (H) 11/24/2021   HDL 39 (L) 11/24/2021   CHOLHDL 3.3 11/24/2021   VLDL 32 11/24/2021   LDLCALC 56 11/24/2021   LDLCALC 95 10/26/2020    Physical Findings: AIMS: Facial and Oral Movements Muscles of Facial Expression: None, normal Lips and Perioral Area: None, normal Jaw: None, normal Tongue: None, normal,Extremity Movements Upper (arms, wrists, hands, fingers): None, normal Lower (legs, knees, ankles, toes): None, normal, Trunk Movements Neck, shoulders, hips: None, normal, Overall Severity Severity of abnormal movements (highest score from questions above): None, normal Incapacitation due to abnormal movements: None, normal Patient's awareness of abnormal movements (rate only patient's report): No Awareness, Dental Status Current problems with teeth and/or dentures?: No Does patient usually wear dentures?: No  CIWA:    COWS:     Musculoskeletal: Strength & Muscle Tone: within normal limits Gait & Station: normal Patient leans: N/A  Psychiatric Specialty Exam:  Presentation  General Appearance: Appropriate for Environment  Eye Contact:Fleeting  Speech:Clear and Coherent  Speech Volume:Normal  Handedness:Right   Mood and Affect  Mood:Labile  Affect:Congruent   Thought Process  Thought Processes:Disorganized  Descriptions of  Associations:Tangential  Orientation:Full (Time, Place and Person)  Thought Content:Tangential; Scattered  History of Schizophrenia/Schizoaffective disorder:Yes  Duration of Psychotic Symptoms:Greater than six months  Hallucinations:Hallucinations: Auditory  Ideas of Reference:Paranoia; Delusions  Suicidal Thoughts:Suicidal Thoughts: No  Homicidal Thoughts:Homicidal Thoughts: No   Sensorium  Memory:Immediate Fair; Recent Poor  Judgment:Impaired  Insight:None   Executive Functions  Concentration:Poor  Attention Span:Poor  Recall:Poor  Fund of Knowledge:Poor  Language:Poor   Psychomotor Activity  Psychomotor Activity:Psychomotor Activity: Restlessness   Assets  Assets:Resilience   Sleep  Sleep:Sleep: Fair    Physical Exam: Physical Exam Constitutional:  Appearance: Normal appearance.  Pulmonary:     Effort: Pulmonary effort is normal.  Neurological:     Mental Status: She is alert and oriented to person, place, and time.   ROS Blood pressure (!) 131/103, pulse (!) 104, temperature 98.1 F (36.7 C), temperature source Oral, resp. rate 18, height 5' (1.524 m), weight 76.7 kg, SpO2 99 %. Body mass index is 33.01 kg/m.   Treatment Plan Summary: Daily contact with patient to assess and evaluate symptoms and progress in treatment and Medication management  Eliane is a 56 yo patient w/ PPH of schizophrenia. Patient slept the most she has in 2 weeks, last night. Patient cognitive status fluctuates from day to day and patient is very sensitive to medications. Patient's KUB suggest that she is unfortunately suffering from the anticholinergic side effects of her medications. Patient's bladder is distended and patient communicates pain in her lower abdomen. Due to the fact that patient remains constipated and has distended bladder, will have to hold off on clozaril for her treatment refractory schizophrenia, due to it's Anticholinergic effects.  Patient  appears to be suffering from antimuscarinic side effects on the medications. Zyprexa is the strongest antimuscarinic between two current antipsychotics. Patient would also benefit from a medication with strong a-1 antagonism, Clozaril. Patient should be started on Clozaril and her Zyprexa should be titrated down to address both her bladder retention and continued fluctuating in her psychosis, Patient is treatment refractory for primary D 2 antagonism medications. She would benefit from targeting bother 5HT2 and D-2 receptors. Will decrease Zyprexa and continue Aricept in attempt to help patient have bowel movement, decrease bladder retention and provide some cholinergic activity.  Schizoaffective Disorder- Bipolar type: IVC first eval completed 12-02-21. IVC served to pt 12-02-21. 24 hour eval completed 12-02-21.   FORCED MED ORDER Started 1/5  - 1/12!!!! Second FORCED MED ORDER 1/13-1/20!!! - Geodon 80mg  BID PO or 20mg  IM if refuses PO - Zyprexa 5mg  PO qam or 5mg  IM if refuses PO; 10 mg qhs or 5 mg IM if refuses PO   Regular scheduled meds -Continue Geodon 80 mg BID -Continue Restoril 30 mg QHS -Continue Depakote ER 750mg   -(1/9) VA level: 117, CBC-WNL, hepatic function panel-AST 12  - follow-up repeat Depakote level (14) , CBC (WNL), and CMP - Decrease Zyprexa  to 5 mg daily and 5mg  QHS -Continue  Amantadine 100mg  every other day , EPS prophylaxis -Continue Aricept 5mg  QHS for cognitive impairment    Expected schedule for Clozaril titration (started after pt has bowel movement, gets EKG, and if pt is still a candidate for clozapine at that time): Day 1: Start Clozaril 12.5mg  QHS, Decrease Zyprexa to 5mg  BID Day 2: Clozaril 12.5mg  BID, Zyprexa 5mg  BID Day 3: Clozaril 12.5 mg daily and 25mg  QHS, Zyprexa 2.5mg  daily and 5mg  QHS Day 4: Clozaril 25mg  daily and 37.5mg  QHS, Zyprexa 2.5mg  QHS Day 5: Clozaril 37.5mg  BID     HTN   Tachycardia: -Continue Propanolol ER 60 mg once daily  -Continue to  hold Losartan 25 mg daily - Creatinine 1.42> 1.60>1.29>1.29>1.52   AKI- improving, stable: Will push PO fluids -Encourage PO fluid intake: Gatorade and H2O   Hyperthyroidism: Spoke with hospitalist (1/3)  -Continue methimazole at 5 mg bid  -TSH (12/29)- 6.847>>> 12.328 (1/3)>>>6.397 (12-02-21) -Free T4 0.84 wnl (12-02-21)  -Follow up with PCP for further management   Constipation - Discontinue Colace 100mg  - Discontinue Milk of Mag due to renal function  - Start Miralax daily -  Order KUB   Other PRNs:  -Continue Protonix 20 mg daily -Continue PRN's: Tylenol, Maalox, Atarax, Milk of Magnesia, Trazodone   Toe pain - Volatren gel QID   PGY-2 Freida Busman, MD 12/10/2021, 11:14 AM
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
## 2021-12-10 NOTE — [facility] Group Notes (Signed)
Adult Psychoeducational Group Note  Date:  12/10/2021 Time:  11:47 PM  Group Topic/Focus:  Goals Group:   The focus of this group is to help patients establish daily goals to achieve during treatment and discuss how the patient can incorporate goal setting into their daily lives to aide in recovery.  Participation Level:  Active  Participation Quality:  Monopolizing  Affect:  Irritable  Cognitive:  Disorganized, Delusional, and Lacking  Insight: Limited  Engagement in Group:  Distracting, Off Topic, and Poor  Modes of Intervention:  Discussion  Additional Comments  Dalene Carrow 12/10/2021, 11:47 PM

## 2021-12-11 DIAGNOSIS — Z20822 Contact with and (suspected) exposure to covid-19: Secondary | ICD-10-CM | POA: Diagnosis not present

## 2021-12-11 DIAGNOSIS — G9349 Other encephalopathy: Secondary | ICD-10-CM | POA: Diagnosis not present

## 2021-12-11 DIAGNOSIS — F250 Schizoaffective disorder, bipolar type: Secondary | ICD-10-CM | POA: Diagnosis not present

## 2021-12-11 DIAGNOSIS — N179 Acute kidney failure, unspecified: Secondary | ICD-10-CM | POA: Diagnosis not present

## 2021-12-11 MED ORDER — MIRTAZAPINE 7.5 MG PO TABS
7.5000 mg | ORAL_TABLET | Freq: Every day | ORAL | Status: DC
Start: 2021-12-11 — End: 2021-12-11

## 2021-12-11 MED ORDER — LORAZEPAM 2 MG/ML IJ SOLN
1.0000 mg | Freq: Four times a day (QID) | INTRAMUSCULAR | Status: DC | PRN
  Administered 2021-12-11: 1 mg via INTRAMUSCULAR
  Filled 2021-12-11 (×2): qty 1

## 2021-12-11 MED ORDER — CLOZAPINE 25 MG PO TABS
12.5000 mg | ORAL_TABLET | Freq: Every day | ORAL | Status: DC
  Filled 2021-12-11 (×3): qty 1

## 2021-12-11 MED ORDER — DIPHENHYDRAMINE HCL 50 MG/ML IJ SOLN
50.0000 mg | Freq: Once | INTRAMUSCULAR | Status: AC
  Administered 2021-12-11: 50 mg via INTRAMUSCULAR
  Filled 2021-12-11 (×2): qty 1

## 2021-12-11 MED ORDER — HALOPERIDOL 5 MG PO TABS: 5.0000 mg | ORAL_TABLET | Freq: Four times a day (QID) | ORAL | Status: DC | PRN

## 2021-12-11 MED ORDER — OLANZAPINE 5 MG PO TABS
5.0000 mg | ORAL_TABLET | Freq: Two times a day (BID) | ORAL | Status: DC
  Administered 2021-12-11 – 2021-12-13 (×3): 5 mg via ORAL
  Filled 2021-12-11 (×7): qty 1

## 2021-12-11 MED ORDER — LORAZEPAM 1 MG PO TABS: 1.0000 mg | ORAL_TABLET | Freq: Four times a day (QID) | ORAL | Status: DC | PRN

## 2021-12-11 MED ORDER — HALOPERIDOL LACTATE 5 MG/ML IJ SOLN
5.0000 mg | Freq: Four times a day (QID) | INTRAMUSCULAR | Status: DC | PRN
  Administered 2021-12-11: 5 mg via INTRAMUSCULAR
  Filled 2021-12-11: qty 1

## 2021-12-11 MED ORDER — MELATONIN 5 MG PO TABS
10.0000 mg | ORAL_TABLET | Freq: Every day | ORAL | Status: DC
  Administered 2021-12-15 – 2021-12-23 (×8): 10 mg via ORAL
  Filled 2021-12-11 (×14): qty 2
  Filled 2021-12-11: qty 14
  Filled 2021-12-11 (×3): qty 2

## 2021-12-11 NOTE — Progress Notes (Signed)
Patient has been very labile and argumentative with staff . She will come to nursing station to ask a question and she gets upset when writer tries to respond to her questions. She refused her PO medications on tonight and receive Ativan 1mg  and zyrexa 5 mg IM. She has had to be redirected several times tonight concerning her behavior and not respecting other patients on the unit.

## 2021-12-11 NOTE — Progress Notes (Signed)
Writer called patient to medication window to explain to her the medications due. She asked to call them out and she wanted to see them and know what they are for. Writer called out her depakote and aricept and she immediately started yelling oh no y'all are trying to kill me and walked off saying my legs feel like they are cracking and went into dayroom and sat down. Writer medicated all the other patients before asking her a second time and she reported " I told you Im not taking all those meds." And continued to sit in the dayroom yelling about how the writer was trying to kill her and she didn't trust me. The other patients could not watch tv because she was talking to them about her doctor and nurses don't know what they are doing. Writer explained to her that she needed to calm down and let the other patients in the dayroom watch tv and she replied, you calm down, writer asked that she take ativan to help her calm down. I told you I'm not taking any medicines because they are not right.Social research officer, government nurse assisted with giving IM ativan, benadryl and haldol which she took while in the dayroom because she refused to walk with [place] to her room for privacy.Will monitor effectiveness of medications given.

## 2021-12-11 NOTE — Progress Notes (Signed)
Pt was encouraged to go to therapeutic relaxation group but didn't attend.

## 2021-12-11 NOTE — Group Note (Signed)
[facility] LCSW Group Therapy Note  Date/Time:  12/11/2021  11:00AM-12:00PM  Type of Therapy and Topic:  Group Therapy:  Music and Mood  Participation Level:  Active   Description of Group: In this process group, members listened to a variety of genres of music and identified that different types of music evoke different responses.  Patients were encouraged to identify music that was soothing for them and music that was energizing for them.  Patients discussed how this knowledge can help with wellness and recovery in various ways including managing depression and anxiety as well as encouraging healthy sleep habits.    Therapeutic Goals: Patients will explore the impact of different varieties of music on mood Patients will verbalize the thoughts they have when listening to different types of music Patients will identify music that is soothing to them as well as music that is energizing to them Patients will discuss how to use this knowledge to assist in maintaining wellness and recovery Patients will explore the use of music as a coping skill  Summary of Patient Progress:  At the beginning of group, patient expressed she felt "threatened."  She complained throughout the group about items that were completely off topic.  She was very focused on getting "medicines I don't need, shots I don't want while people who need them aren't getting medicine."  She kept stating she does not ever get time to read, so she wanted to go to her room and do that.  CSW informed her that was okay, invited her to leave, but she insisted on staying.  At the end of group, patient got up and walked out without saying whether she still felt threatened.   Therapeutic Modalities: Solution Focused Brief Therapy Activity   Selmer Dominion, LCSW

## 2021-12-11 NOTE — Progress Notes (Signed)
[facility] MD Progress Note  12/11/2021 10:43 AM Paula Dickson  MRN:  [id number] Subjective:  Paula Dickson is a 56 yr old female who presents with bizarre behaviors and nonsensical speech after medication noncompliance.  PPHx is significant for Schizoaffective Disorder- Bipolar type complicated by medication noncompliance and previous suicide attempt (OD in 2018), multiple hospitalizations (latest 11/21 [facility]).   Case was discussed in the multidisciplinary team.  MAR was reviewed Patient refused her QHS PO medications and did not receive IM. Patient was more irritable overnight and this AM. Patient was screaming yesterday and required IM medications during the day.   Psychiatry team made following the recommendations yesterday: IVC first eval completed 12-02-21. IVC served to pt 12-02-21. 24 hour eval completed 12-02-21.   FORCED MED ORDER Started 1/5  - 1/12!!!! Second FORCED MED ORDER 1/13-1/20!!! - Geodon 80mg  BID PO or 20mg  IM if refuses PO - Zyprexa 5mg  PO qam or 5mg  IM if refuses PO; 10 mg qhs or 5 mg IM if refuses PO   Regular scheduled meds -Continue Geodon 80 mg BID -Continue Restoril 30 mg QHS -Continue Depakote ER 750mg   -(1/9) VA level: 117, CBC-WNL, hepatic function panel-AST 12  - follow-up repeat Depakote level, CBC, and CMP -Continue Zyprexa  to 5 mg daily and 10mg  QHS -Decrease Amantadine 100mg  every other day , EPS prophylaxis -Start Aricept 5mg  QHS for cognitive impairment    Expected schedule for Clozaril titration (started after pt has bowel movement, gets EKG, and if pt is still a candidate for clozapine at that time): Day 1: Start Clozaril 12.5mg  QHS, Decrease Zyprexa to 5mg  BID Day 2: Clozaril 12.5mg  BID, Zyprexa 5mg  BID Day 3: Clozaril 12.5 mg daily and 25mg  QHS, Zyprexa 2.5mg  daily and 5mg  QHS Day 4: Clozaril 25mg  daily and 37.5mg  QHS, Zyprexa 2.5mg  QHS Day 5: Clozaril 37.5mg  BID  Provider saw patient approx 1 hr after receiving her medications. Per RN patient was  difficult this AM and required significant redirecting for her medications. Patient is attempting to monopolize group sessions with her disorganized, tangential speech. Patient is also appearing to verbally support other patient's when those patient's refuse their medications. Patient is often a distraction on the unit, repetitively approaching staff, yelling, refusing her meds, and getting upset with staff because she is not being discharged. RN reports that this AM patient threw her brown bag at RN.   On assessment after meds, patient lies in her bed and is hyperverbal, disorganized, and tangential, talking about education, family, not feeling her medications are right, not remembering what her medications are, and her dislike for another psychiatric hospital. Patient denies SI and HI. Patient endorses both AVH today. Patient reports that she is concerned about other patient's and herself at night time. Patient endorses she knows she is not suppose to hit the button in her room, but she does not feel she can ask for things at night.   Principal Problem: Schizoaffective disorder, bipolar type ([place]) Diagnosis: Principal Problem:   Schizoaffective disorder, bipolar type ([place]) Active Problems:   Hyperthyroidism   Benign essential HTN   AKI (acute kidney injury) ([place])   Cognitive dysfunction in chronic schizophrenia ([place])  Total Time spent with patient: 20 minutes  Past Psychiatric History: See H&P  Past Medical History:  Past Medical History:  Diagnosis Date   Bipolar affective disorder ([place])    Bipolar disorder ([place])    History of arthritis    History of chicken pox    History of depression  History of genital warts    history of heart murmur    History of high blood pressure    History of thyroid disease    History of UTI    Hypertension    Low TSH level 07/13/2017   Schizophrenia ([place])     Past Surgical History:  Procedure Laterality Date   ABLATION ON ENDOMETRIOSIS     CYST  REMOVAL NECK     around 11 years ago /benign   MULTIPLE TOOTH EXTRACTIONS     Family History:  Family History  Problem Relation Age of Onset   Arthritis Father    Hyperlipidemia Father    High blood pressure Father    Diabetes Sister    Diabetes Mother    Diabetes Brother    Mental illness Brother    Alcohol abuse Paternal Uncle    Alcohol abuse Paternal Grandfather    Breast cancer Maternal Aunt    Breast cancer Paternal Aunt    High blood pressure Sister    Mental illness Other        runs in family   Family Psychiatric  History:  See H&P Social History:  Social History   Substance and Sexual Activity  Alcohol Use Not Currently     Social History   Substance and Sexual Activity  Drug Use Not Currently    Social History   Socioeconomic History   Marital status: Single    Spouse name: Not on file   Number of children: Not on file   Years of education: Not on file   Highest education level: Patient refused  Occupational History   Not on file  Tobacco Use   Smoking status: Never   Smokeless tobacco: Never  Vaping Use   Vaping Use: Never used  Substance and Sexual Activity   Alcohol use: Not Currently   Drug use: Not Currently   Sexual activity: Not Currently  Other Topics Concern   Not on file  Social History Narrative   ** Merged History Encounter **       Social Determinants of Health   Financial Resource Strain: Not on file  Food Insecurity: Not on file  Transportation Needs: Not on file  Physical Activity: Not on file  Stress: Not on file  Social Connections: Not on file   Additional Social History:                         Sleep: Fair  Appetite:  Good  Current Medications: Current Facility-Administered Medications  Medication Dose Route Frequency Provider Last Rate Last Admin   acetaminophen (TYLENOL) tablet 650 mg  650 mg Oral Q6H PRN Ethelene Hal, NP   650 mg at 11/27/21 2347   alum & mag hydroxide-simeth  (MAALOX/MYLANTA) 200-200-20 MG/5ML suspension 30 mL  30 mL Oral Q4H PRN Ethelene Hal, NP       amantadine (SYMMETREL) capsule 100 mg  100 mg Oral QODAY Massengill, Ovid Curd, MD   100 mg at 12/11/21 1004   cloZAPine (CLOZARIL) tablet 12.5 mg  12.5 mg Oral Q2000 Loreal Schuessler, Joyce Gross B, MD       diclofenac Sodium (VOLTAREN) 1 % topical gel 2 g  2 g Topical QID Damita Dunnings B, MD   2 g at 12/11/21 1005   divalproex (DEPAKOTE ER) 24 hr tablet 750 mg  750 mg Oral Q2000 Damita Dunnings B, MD   750 mg at 12/09/21 2035   donepezil (ARICEPT) tablet 5 mg  5 mg Oral QHS Massengill, Ovid Curd, MD   5 mg at 12/09/21 2033   haloperidol (HALDOL) tablet 5 mg  5 mg Oral Q6H PRN Maida Sale, MD       And   LORazepam (ATIVAN) tablet 1 mg  1 mg Oral Q6H PRN Hill, Jackie Plum, MD       haloperidol lactate (HALDOL) injection 5 mg  5 mg Intramuscular Q6H PRN Hill, Jackie Plum, MD       And   LORazepam (ATIVAN) injection 1 mg  1 mg Intramuscular Q6H PRN Hill, Jackie Plum, MD       temazepam (RESTORIL) capsule 30 mg  30 mg Oral QHS Damita Dunnings B, MD       Or   LORazepam (ATIVAN) injection 1 mg  1 mg Intravenous QHS Damita Dunnings B, MD   1 mg at 12/10/21 2113   melatonin tablet 10 mg  10 mg Oral QHS Hill, Jackie Plum, MD       methimazole (TAPAZOLE) tablet 5 mg  5 mg Oral BID Massengill, Ovid Curd, MD   5 mg at 12/11/21 1005   OLANZapine (ZYPREXA) tablet 5 mg  5 mg Oral BID Damita Dunnings B, MD       pantoprazole (PROTONIX) EC tablet 20 mg  20 mg Oral Daily Rosezetta Schlatter, MD   20 mg at 12/11/21 1004   polyethylene glycol (MIRALAX / GLYCOLAX) packet 17 g  17 g Oral Daily Damita Dunnings B, MD   17 g at 12/11/21 1003   propranolol ER (INDERAL LA) 24 hr capsule 60 mg  60 mg Oral Daily Massengill, Ovid Curd, MD   60 mg at 12/11/21 1006   white petrolatum (VASELINE) gel   Topical PRN Rosezetta Schlatter, MD   Given at 12/04/21 1516    Lab Results:  Results for orders placed or performed during the hospital  encounter of 11/23/21 (from the past 48 hour(s))  CBC with Differential/Platelet     Status: None   Collection Time: 12/09/21  3:05 PM  Result Value Ref Range   WBC 7.1 4.0 - 10.5 K/uL   RBC 4.28 3.87 - 5.11 MIL/uL   Hemoglobin 12.5 12.0 - 15.0 g/dL   HCT 38.7 36.0 - 46.0 %   MCV 90.4 80.0 - 100.0 fL   MCH 29.2 26.0 - 34.0 pg   MCHC 32.3 30.0 - 36.0 g/dL   RDW 12.8 11.5 - 15.5 %   Platelets 223 150 - 400 K/uL   nRBC 0.0 0.0 - 0.2 %   Neutrophils Relative % 54 %   Neutro Abs 3.9 1.7 - 7.7 K/uL   Lymphocytes Relative 31 %   Lymphs Abs 2.2 0.7 - 4.0 K/uL   Monocytes Relative 10 %   Monocytes Absolute 0.7 0.1 - 1.0 K/uL   Eosinophils Relative 4 %   Eosinophils Absolute 0.3 0.0 - 0.5 K/uL   Basophils Relative 1 %   Basophils Absolute 0.0 0.0 - 0.1 K/uL   Immature Granulocytes 0 %   Abs Immature Granulocytes 0.02 0.00 - 0.07 K/uL    Comment: Performed at [facility], [place] [address], [place], [place] [address]  Comprehensive metabolic panel     Status: Abnormal   Collection Time: 12/09/21  3:05 PM  Result Value Ref Range   Sodium 136 135 - 145 mmol/L   Potassium 4.1 3.5 - 5.1 mmol/L   Chloride 107 98 - 111 mmol/L   CO2 26 22 - 32 mmol/L   Glucose,  Bld 113 (H) 70 - 99 mg/dL    Comment: Glucose reference range applies only to samples taken after fasting for at least 8 hours.   BUN 22 (H) 6 - 20 mg/dL   Creatinine, Ser 1.52 (H) 0.44 - 1.00 mg/dL   Calcium 9.6 8.9 - 10.3 mg/dL   Total Protein 7.1 6.5 - 8.1 g/dL   Albumin 4.0 3.5 - 5.0 g/dL   AST 21 15 - 41 U/L   ALT 14 0 - 44 U/L   Alkaline Phosphatase 64 38 - 126 U/L   Total Bilirubin 0.8 0.3 - 1.2 mg/dL   GFR, Estimated 40 (L) >60 mL/min    Comment: (NOTE) Calculated using the CKD-EPI Creatinine Equation (2021)    Anion gap 3 (L) 5 - 15    Comment: Performed at [facility], [name] [address], [place], [place] [address]  Valproic acid level     Status: Abnormal   Collection Time: 12/09/21   3:05 PM  Result Value Ref Range   Valproic Acid Lvl 14 (L) 50.0 - 100.0 ug/mL    Comment: Performed at [facility], [place] [address] [place], [place] [address]    Blood Alcohol level:  Lab Results  Component Value Date   [facility] <10 11/22/2021   ETH <10 17/71/1657    Metabolic Disorder Labs: Lab Results  Component Value Date   HGBA1C 6.1 (H) 11/24/2021   MPG 128.37 11/24/2021   MPG 119.76 03/28/2021   Lab Results  Component Value Date   PROLACTIN 11.0 03/14/2019   Lab Results  Component Value Date   CHOL 127 11/24/2021   TRIG 161 (H) 11/24/2021   HDL 39 (L) 11/24/2021   CHOLHDL 3.3 11/24/2021   VLDL 32 11/24/2021   LDLCALC 56 11/24/2021   LDLCALC 95 10/26/2020    Physical Findings: AIMS: Facial and Oral Movements Muscles of Facial Expression: None, normal Lips and Perioral Area: None, normal Jaw: None, normal Tongue: None, normal,Extremity Movements Upper (arms, wrists, hands, fingers): None, normal Lower (legs, knees, ankles, toes): None, normal, Trunk Movements Neck, shoulders, hips: None, normal, Overall Severity Severity of abnormal movements (highest score from questions above): None, normal Incapacitation due to abnormal movements: None, normal Patient's awareness of abnormal movements (rate only patient's report): No Awareness, Dental Status Current problems with teeth and/or dentures?: No Does patient usually wear dentures?: No  CIWA:    COWS:     Musculoskeletal: Strength & Muscle Tone: within normal limits Gait & Station: normal Patient leans: N/A  Psychiatric Specialty Exam:  Presentation  General Appearance: Casual  Eye Contact:Fleeting  Speech:Clear and Coherent (hyperverbal)  Speech Volume:Normal  Handedness:Right   Mood and Affect  Mood:Labile  Affect:Congruent   Thought Process  Thought Processes:Disorganized  Descriptions of Associations:Tangential  Orientation:Full (Time, Place and Person)  Thought  Content:Scattered; Tangential  History of Schizophrenia/Schizoaffective disorder:Yes  Duration of Psychotic Symptoms:Greater than six months  Hallucinations:Hallucinations: Auditory; Visual  Ideas of Reference:None  Suicidal Thoughts:Suicidal Thoughts: No  Homicidal Thoughts:Homicidal Thoughts: No   Sensorium  Memory:Immediate Fair; Recent Poor  Judgment:Impaired  Insight:Shallow   Executive Functions  Concentration:Poor  Attention Span:Poor  Recall:Poor  Fund of Knowledge:Fair  Language:Fair   Psychomotor Activity  Psychomotor Activity:Psychomotor Activity: Normal   Assets  Assets:Resilience   Sleep  Sleep:Sleep: Fair    Physical Exam: Physical Exam Constitutional:      Appearance: Normal appearance.  HENT:     Head: Normocephalic and atraumatic.  Pulmonary:     Effort:  Pulmonary effort is normal.  Neurological:     Mental Status: She is alert and oriented to person, place, and time.   Review of Systems  Psychiatric/Behavioral:  Positive for hallucinations. Negative for suicidal ideas.   Blood pressure (!) 141/109, pulse (!) 101, temperature (!) 97.4 F (36.3 C), temperature source Oral, resp. rate 16, height 5' (1.524 m), weight 76.7 kg, SpO2 100 %. Body mass index is 33.01 kg/m.   Treatment Plan Summary: Daily contact with patient to assess and evaluate symptoms and progress in treatment and Medication management Paula Dickson is a 56 yo patient w/ PPH of schizophrenia. Patient had a BM confirmed by RN. There is now decrease concern for patient to have SBO soon after starting Clozaril. Despite patient appearing refractory to Geodon and Zyprexa, Zyprexa is not at it's maximum dose and has less D2-r antagonism than Geodon. Therefor will titrate down on Clozaril while titration up on Clozaril, which will target serotonin receptors. Patient is very sensitive to medication changes and a rapid titration or discontinuation of medication could result in large  decompensation that place patient, other patient's and staff's safety at risk. Due to patient's day to day fluctuation will change patient's PRN to Haldol for more strong D2 antagonism when patient has decompensated or is refusing to take Clozaril. Will slow down titration if patient is noted to not handle well at current recommended schedule. Overall patient's psychosis and behavior fluctuate from day to day and patient does not appear to be able to handle interpersonal interactions very well. Patient is not ready for discharge and has been recommended to [facility], due to her psychiatric complexity.  Schizoaffective Disorder- Bipolar type: IVC first eval completed 12-02-21. IVC served to pt 12-02-21. 24 hour eval completed 12-02-21.   FORCED MED ORDER Started 1/5  - 1/12!!!! Second FORCED MED ORDER 1/13-1/20!!! - Geodon 80mg  BID PO or 20mg  IM if refuses PO - Zyprexa 5mg  PO qam or 5mg  IM if refuses PO; 10 mg qhs or 5 mg IM if refuses PO   Regular scheduled meds -Continue Geodon 80 mg BID -Continue Restoril 30 mg QHS -Continue Depakote ER 750mg   -(1/9) VA level: 117, CBC-WNL, hepatic function panel-AST 12  - follow-up repeat Depakote level (14) , CBC (WNL), and CMP - Decrease Zyprexa  to 5 mg daily and 5mg  QHS -Continue  Amantadine 100mg  every other day , EPS prophylaxis -Continue Aricept 5mg  QHS for cognitive impairment, patient repsoding well, consider increase to BID in a 3-4 days EKG- QTC 437 (1/15)   Expected schedule for Clozaril titration:  Day 1: Start Clozaril 12.5mg  QHS, Decrease Zyprexa to 5mg  BID Day 2: Clozaril 12.5mg  BID, Zyprexa 5mg  BID Day 3: Clozaril 12.5 mg daily and 25mg  QHS, Zyprexa 2.5mg  daily and 5mg  QHS Day 4: Clozaril 25mg  daily and 37.5mg  QHS, Zyprexa 2.5mg  QHS Day 5: Clozaril 37.5mg  BID  REMS: [id number]   HTN   Tachycardia: -Continue Propanolol ER 60 mg once daily  -Continue to hold Losartan 25 mg daily - Creatinine 1.42> 1.60>1.29>1.29>1.52   AKI- improving,  stable: Will push PO fluids -Encourage PO fluid intake: Gatorade and H2O   Hyperthyroidism: Spoke with hospitalist (1/3)  -Continue methimazole at 5 mg bid  -TSH (12/29)- 6.847>>> 12.328 (1/3)>>>6.397 (12-02-21) -Free T4 0.84 wnl (12-02-21)  -Follow up with PCP for further management   Constipation - Continue Miralax daily - KUB, noted distended bladder and moderate stool burden - Continue Aricept   Other PRNs:  -Continue Protonix 20 mg daily -Continue PRN's: Tylenol,  Maalox, Atarax, Milk of Magnesia, Trazodone   Toe pain - Volatren gel QID   PGY-2 Freida Busman, MD 12/11/2021, 10:43 AM

## 2021-12-11 NOTE — [facility] Group Notes (Signed)
Adult Psychoeducational Group Note  Date:  12/11/2021 Time:  11:36 AM  Group Topic/Focus:  Goals Group:   The focus of this group is to help patients establish daily goals to achieve during treatment and discuss how the patient can incorporate goal setting into their daily lives to aide in recovery.  Participation Level:  Active  Participation Quality:  Monopolizing  Affect:  Appropriate  Cognitive:  Lacking  Insight: Lacking  Engagement in Group:  Distracting  Modes of Intervention:  Education, Orientation, and Socialization  Additional Comments:  Pt attended group today and was redirected several times to stay on topic. Pt did not have a goal she wanted to work today.  Harriet Masson 12/11/2021, 11:36 AM

## 2021-12-11 NOTE — Progress Notes (Signed)
Pt requires multiple verbal redirections / prompts to cooperative with nursing care this shift. Continues to yell at staff and is argumentative when decisions don't favor her. Took her medications and EKG done with increased prompts. Visible in dayroom for scheduled groups. Showered and change clothes. Defiant and oppositional on output monitoring "I don't you or anyone in here looking at me or checking my urine; just get out". Treatment team made aware of pt's poor response to previous medication regimen. Changes made to pt's medication regimen and EKG done as ordered to support changes. Support and reassurance offered. All medications given as ordered. Safety checks maintained at Q 15 minutes intervals. Pt tolerates all PO intake well. Remains safe in milieu.

## 2021-12-11 NOTE — Progress Notes (Signed)
Adult Psychoeducational Group Note  Date:  12/11/2021 Time:  9:14 PM  Group Topic/Focus:  Wrap-Up Group:   The focus of this group is to help patients review their daily goal of treatment and discuss progress on daily workbooks.  Participation Level:  Active  Participation Quality:  Appropriate  Affect:  Appropriate  Cognitive:  Appropriate  Insight: Appropriate  Engagement in Group:  Engaged  Modes of Intervention:  Discussion  Additional Comments:  Pt stated her goal for today was to focus on her treatment plan. Pt stated she accomplished her goal today. Pt stated she talked with her doctor and with her social worker about her care today. Pt rated her overall day a 10. Pt stated she was able to contact her brother, sister, and family friend today which improved her overall day. Pt stated she felt better about herself tonight. Pt stated she was able to attend  all meals today . Pt stated she took all medications provided today. Pt stated her appetite was good today. Pt rated her sleep last night was pretty good. Pt stated the goal tonight was to get some rest. Pt stated she had no physical pain tonight.  Pt denies  visual hallucinations and auditory issues tonight. Pt denies thoughts of harming herself or others. Pt stated she would alert staff if anything changed.  [name] 12/11/2021, 9:14 PM

## 2021-12-12 DIAGNOSIS — F250 Schizoaffective disorder, bipolar type: Secondary | ICD-10-CM | POA: Diagnosis not present

## 2021-12-12 MED ORDER — DIPHENHYDRAMINE HCL 50 MG/ML IJ SOLN
50.0000 mg | Freq: Four times a day (QID) | INTRAMUSCULAR | Status: DC | PRN
  Administered 2021-12-12 – 2021-12-13 (×2): 50 mg via INTRAMUSCULAR
  Filled 2021-12-12 (×3): qty 1

## 2021-12-12 MED ORDER — HALOPERIDOL 5 MG PO TABS
5.0000 mg | ORAL_TABLET | Freq: Two times a day (BID) | ORAL | Status: DC
  Administered 2021-12-13: 5 mg via ORAL
  Filled 2021-12-12 (×5): qty 1

## 2021-12-12 MED ORDER — DIPHENHYDRAMINE HCL 50 MG/ML IJ SOLN: 50.0000 mg | Freq: Once | INTRAMUSCULAR | Status: DC

## 2021-12-12 MED ORDER — HALOPERIDOL LACTATE 5 MG/ML IJ SOLN
5.0000 mg | Freq: Two times a day (BID) | INTRAMUSCULAR | Status: DC
  Administered 2021-12-12: 5 mg via INTRAMUSCULAR
  Filled 2021-12-12 (×5): qty 1

## 2021-12-12 NOTE — Progress Notes (Signed)
Pt labile on the unit, pt refused to take PO medications from writer, so pt given PRN IM-Benadryl, IM Haldol, IM Ativan per [facility]    12/12/21 2100  Psych Admission Type (Psych Patients Only)  Admission Status Involuntary  Psychosocial Assessment  Patient Complaints Anxiety;Irritability  Eye Contact Staring  Facial Expression Angry  Affect Irritable  Speech Incoherent;Pressured;Rapid;Slurred;Word salad;Argumentative  Interaction Assertive;Demanding;Sarcastic;Intrusive  Motor Activity Shuffling  Appearance/Hygiene Improved  Behavior Characteristics Agressive verbally;Anxious;Irritable;Resistant to care  Mood Labile;Suspicious;Preoccupied  Aggressive Behavior  Effect No apparent injury  Thought Process  Coherency Disorganized  Content Religiosity  Delusions Religious  Perception Hallucinations  Hallucination Auditory;Visual  Judgment Poor  Confusion Moderate  Danger to Self  Current suicidal ideation? Denies  Danger to Others  Danger to Others None reported or observed

## 2021-12-12 NOTE — [facility] Group Notes (Signed)
Adult Psychoeducational Group Note  Date:  12/12/2021 Time:  8:58 AM  Group Topic/Focus:  Goals Group:   The focus of this group is to help patients establish daily goals to achieve during treatment and discuss how the patient can incorporate goal setting into their daily lives to aide in recovery.  Participation Level:  Active  Participation Quality:  Intrusive  Affect:  Appropriate  Cognitive:  Confused  Insight: Lacking  Engagement in Group:  Improving  Modes of Intervention:  Clarification and Discussion    Paula Dickson 12/12/2021, 8:58 AM

## 2021-12-12 NOTE — Group Note (Signed)
Recreation Therapy Group Note   Group Topic:Personal Development  Group Date: 12/12/2021 Start Time: 1000 End Time: 1025 Facilitators: [name], LRT,CTRS Location: [address] Dayroom   Goal Area(s) Addresses:   Patient will successfully identify benefit of reflecting on overcoming past challenges. Patient will successfully identify one thing they would do different if given the opportunity. Patient will identify how to take lessons learned and use them post d/c.   Group Description:  Letter To My Younger Self.  Patients were given a blank outline of a mirror.  Patients were to then write a letter/advice to their younger selves.  Patients will express what they would taught themselves that would have had allowed them to make better decisions right now.   Affect/Mood: Appropriate   Participation Level: Minimal   Participation Quality: Independent   Behavior: Interactive    Speech/Thought Process: Relevant   Insight: Moderate   Judgement: Moderate   Modes of Intervention: Worksheet   Patient Response to Interventions:  Attentive   Education Outcome:  Acknowledges education and In group clarification offered    Clinical Observations/Individualized Feedback: Pt arrived late to group.  LRT explained activity to pt and pt stated she wanted to work on it throughout the day because she has a lot to say.  Pt did express one thing she would tell her younger self is "you can learn a lot from the [place] if you just pay attention".  Pt went on to ramble about random things.   Plan: Continue to engage patient in RT group sessions 2-3x/week.   [name], Glennis Brink 12/12/2021 12:23 PM

## 2021-12-12 NOTE — Group Note (Signed)
LCSW Group Therapy Note   Group Date: 12/12/2021 Start Time: 1300 End Time: 1400     Participation Level:  Did Not Attend    Vassie Moselle, LCSW 12/12/2021  1:14 PM

## 2021-12-12 NOTE — Progress Notes (Addendum)
[facility] MD Progress Note  12/12/2021 7:30 AM Paula Dickson  MRN:  [id number] Subjective:   Paula Dickson is a 56 yr old female who presents with bizarre behaviors and nonsensical speech after medication noncompliance.  PPHx is significant for Schizoaffective Disorder- Bipolar type complicated by medication noncompliance and previous suicide attempt (OD in 2018), multiple hospitalizations (latest 11/21 [facility]).   Case was discussed in the multidisciplinary team.  MAR was reviewed Patient refused her QHS PO medications and received IM. Patient was more irritable overnight.   Psychiatry team made following the recommendations yesterday: -Discontinue Geodon 80 mg BID -Continue Restoril 30 mg QHS -Continue Depakote ER 750mg   -(1/9) VA level: 117, CBC-WNL, hepatic function panel-AST 12  - follow-up repeat Depakote level (14) , CBC (WNL), and CMP - Decrease Zyprexa  to 5 mg daily and 5mg  QHS -Continue  Amantadine 100mg  every other day , EPS prophylaxis -Continue Aricept 5mg  QHS for cognitive impairment, patient repsoding well, consider increase to BID in a 3-4 days EKG- QTC 437 (1/15) -Start clozapine 12.5 mg qhs    Patient reports that she is confused about her medications and this is why she did not take them last night. Patient reports that she feels more anxious at night and is confused about what is going on. Patient reports that her body is sore especially where she is getting the IM antipsychotics. Patient reports that she slept well after receiving IM Benadryl. Patient endorses that she will attempt to be compliant with her medications if she has a list herself, to keep up with what she is taking. Patient denies SI and HI endorsing " I want to go to heaven." Patient reports she has VH at night and begins whispering about her concerns for the safety and wellbeing of other patients at night. Patient denies AH.   Principal Problem: Schizoaffective disorder, bipolar type ([place]) Diagnosis: Principal  Problem:   Schizoaffective disorder, bipolar type ([place]) Active Problems:   Hyperthyroidism   Benign essential HTN   AKI (acute kidney injury) ([place])   Cognitive dysfunction in chronic schizophrenia ([place])  Total Time spent with patient: 20 minutes  Past Psychiatric History: See H&P  Past Medical History:  Past Medical History:  Diagnosis Date   Bipolar affective disorder ([place])    Bipolar disorder ([place])    History of arthritis    History of chicken pox    History of depression    History of genital warts    history of heart murmur    History of high blood pressure    History of thyroid disease    History of UTI    Hypertension    Low TSH level 07/13/2017   Schizophrenia ([place])     Past Surgical History:  Procedure Laterality Date   ABLATION ON ENDOMETRIOSIS     CYST REMOVAL NECK     around 11 years ago /benign   MULTIPLE TOOTH EXTRACTIONS     Family History:  Family History  Problem Relation Age of Onset   Arthritis Father    Hyperlipidemia Father    High blood pressure Father    Diabetes Sister    Diabetes Mother    Diabetes Brother    Mental illness Brother    Alcohol abuse Paternal Uncle    Alcohol abuse Paternal Grandfather    Breast cancer Maternal Aunt    Breast cancer Paternal Aunt    High blood pressure Sister    Mental illness Other        runs  in family   Family Psychiatric  History: See H&P Social History:  Social History   Substance and Sexual Activity  Alcohol Use Not Currently     Social History   Substance and Sexual Activity  Drug Use Not Currently    Social History   Socioeconomic History   Marital status: Single    Spouse name: Not on file   Number of children: Not on file   Years of education: Not on file   Highest education level: Patient refused  Occupational History   Not on file  Tobacco Use   Smoking status: Never   Smokeless tobacco: Never  Vaping Use   Vaping Use: Never used  Substance and Sexual Activity   Alcohol  use: Not Currently   Drug use: Not Currently   Sexual activity: Not Currently  Other Topics Concern   Not on file  Social History Narrative   ** Merged History Encounter **       Social Determinants of Health   Financial Resource Strain: Not on file  Food Insecurity: Not on file  Transportation Needs: Not on file  Physical Activity: Not on file  Stress: Not on file  Social Connections: Not on file   Additional Social History:                         Sleep: Fair  Appetite:  Good  Current Medications: Current Facility-Administered Medications  Medication Dose Route Frequency Provider Last Rate Last Admin   acetaminophen (TYLENOL) tablet 650 mg  650 mg Oral Q6H PRN Ethelene Hal, NP   650 mg at 11/27/21 2347   alum & mag hydroxide-simeth (MAALOX/MYLANTA) 200-200-20 MG/5ML suspension 30 mL  30 mL Oral Q4H PRN Ethelene Hal, NP       amantadine (SYMMETREL) capsule 100 mg  100 mg Oral QODAY Adolphus Hanf, Ovid Curd, MD   100 mg at 12/11/21 1004   cloZAPine (CLOZARIL) tablet 12.5 mg  12.5 mg Oral Q2000 Damita Dunnings B, MD       diclofenac Sodium (VOLTAREN) 1 % topical gel 2 g  2 g Topical QID Damita Dunnings B, MD   2 g at 12/11/21 1005   divalproex (DEPAKOTE ER) 24 hr tablet 750 mg  750 mg Oral Q2000 McQuilla, Joyce Gross B, MD   750 mg at 12/09/21 2035   donepezil (ARICEPT) tablet 5 mg  5 mg Oral QHS Rithwik Schmieg, Ovid Curd, MD   5 mg at 12/09/21 2033   haloperidol (HALDOL) tablet 5 mg  5 mg Oral Q6H PRN Maida Sale, MD       And   LORazepam (ATIVAN) tablet 1 mg  1 mg Oral Q6H PRN Hill, Jackie Plum, MD       haloperidol lactate (HALDOL) injection 5 mg  5 mg Intramuscular Q6H PRN Maida Sale, MD   5 mg at 12/11/21 2123   And   LORazepam (ATIVAN) injection 1 mg  1 mg Intramuscular Q6H PRN Maida Sale, MD   1 mg at 12/11/21 2122   temazepam (RESTORIL) capsule 30 mg  30 mg Oral QHS Damita Dunnings B, MD       Or   LORazepam (ATIVAN) injection 1  mg  1 mg Intravenous QHS Damita Dunnings B, MD   1 mg at 12/10/21 2113   melatonin tablet 10 mg  10 mg Oral QHS Hill, Jackie Plum, MD       methimazole (TAPAZOLE) tablet 5 mg  5 mg  Oral BID Sair Faulcon, Ovid Curd, MD   5 mg at 12/11/21 1749   OLANZapine (ZYPREXA) tablet 5 mg  5 mg Oral BID Damita Dunnings B, MD   5 mg at 12/11/21 1319   pantoprazole (PROTONIX) EC tablet 20 mg  20 mg Oral Daily Rosezetta Schlatter, MD   20 mg at 12/11/21 1004   polyethylene glycol (MIRALAX / GLYCOLAX) packet 17 g  17 g Oral Daily Damita Dunnings B, MD   17 g at 12/11/21 1003   propranolol ER (INDERAL LA) 24 hr capsule 60 mg  60 mg Oral Daily Mechele Kittleson, Ovid Curd, MD   60 mg at 12/11/21 1006   white petrolatum (VASELINE) gel   Topical PRN Rosezetta Schlatter, MD   Given at 12/04/21 1516    Lab Results: No results found for this or any previous visit (from the past 48 hour(s)).  Blood Alcohol level:  Lab Results  Component Value Date   ETH <10 11/22/2021   ETH <10 79/89/2119    Metabolic Disorder Labs: Lab Results  Component Value Date   HGBA1C 6.1 (H) 11/24/2021   MPG 128.37 11/24/2021   MPG 119.76 03/28/2021   Lab Results  Component Value Date   PROLACTIN 11.0 03/14/2019   Lab Results  Component Value Date   CHOL 127 11/24/2021   TRIG 161 (H) 11/24/2021   HDL 39 (L) 11/24/2021   CHOLHDL 3.3 11/24/2021   VLDL 32 11/24/2021   LDLCALC 56 11/24/2021   LDLCALC 95 10/26/2020    Physical Findings: AIMS: Facial and Oral Movements Muscles of Facial Expression: None, normal Lips and Perioral Area: None, normal Jaw: None, normal Tongue: None, normal,Extremity Movements Upper (arms, wrists, hands, fingers): None, normal Lower (legs, knees, ankles, toes): None, normal, Trunk Movements Neck, shoulders, hips: Mild (bilateral hand tremors), Overall Severity Severity of abnormal movements (highest score from questions above): None, normal Incapacitation due to abnormal movements: None, normal Patient's awareness of  abnormal movements (rate only patient's report): No Awareness, Dental Status Current problems with teeth and/or dentures?: No Does patient usually wear dentures?: No  CIWA:    COWS:     Musculoskeletal: Strength & Muscle Tone: within normal limits Gait & Station: normal Patient leans: N/A  Psychiatric Specialty Exam:  Presentation  General Appearance: Casual  Eye Contact:Fleeting  Speech:Clear and Coherent (hyperverbal)  Speech Volume:Normal  Handedness:Right   Mood and Affect  Mood:Labile  Affect:Congruent   Thought Process  Thought Processes:Disorganized  Descriptions of Associations:Tangential  Orientation:Full (Time, Place and Person)  Thought Content:Scattered; Tangential  History of Schizophrenia/Schizoaffective disorder:Yes  Duration of Psychotic Symptoms:Greater than six months  Hallucinations:Hallucinations: Auditory; Visual  Ideas of Reference:None  Suicidal Thoughts:Suicidal Thoughts: No  Homicidal Thoughts:Homicidal Thoughts: No   Sensorium  Memory:Immediate Fair; Recent Poor  Judgment:Impaired  Insight:Shallow   Executive Functions  Concentration:Poor  Attention Span:Poor  Recall:Poor  Fund of Knowledge:Fair  Language:Fair   Psychomotor Activity  Psychomotor Activity:Psychomotor Activity: Normal   Assets  Assets:Resilience   Sleep  Sleep:Sleep: Fair    Physical Exam: Physical Exam Constitutional:      Appearance: Normal appearance.  HENT:     Head: Normocephalic and atraumatic.  Eyes:     Extraocular Movements: Extraocular movements intact.  Pulmonary:     Effort: Pulmonary effort is normal.  Musculoskeletal:        General: Tenderness present. No swelling.  Skin:    General: Skin is dry.     Comments: No visible cut between the last toe and 4 th toe on  her R foot  Neurological:     Mental Status: She is alert and oriented to person, place, and time.   Review of Systems  Musculoskeletal:  Positive  for myalgias.  Psychiatric/Behavioral:  Positive for hallucinations. Negative for suicidal ideas.    Blood pressure 127/70, pulse 70, temperature 97.8 F (36.6 C), resp. rate 16, height 5' (1.524 m), weight 76.7 kg, SpO2 98 %. Body mass index is 33.01 kg/m.   Treatment Plan Summary: Daily contact with patient to assess and evaluate symptoms and progress in treatment and Medication management Candid is a 56 yo patient w/ PPH of schizophrenia. Patient refused all of her QHS medications and required IM PRN. Patient appears a bit more coherent than usual this AM, suggesting she responded positively to Haldol. Patient is more goal oriented in her conversation and is less hyperverbal. Patient appears to have benefited from Haldol PRN and based on her presentation since starting Aricept, her cognition is benefiting.  Patient appears to be getting no benefit from Geodon at max dose.  Patient also endorsed that she is urinating more easily an is having less suprapubic pain.   Schizoaffective Disorder- Bipolar type: IVC first eval completed 12-02-21. IVC served to pt 12-02-21. 24 hour eval completed 12-02-21.   FORCED MED ORDER Started 1/5  - 1/12!!!! Second FORCED MED ORDER 1/13-1/20!!! -Haldol 5 mg twice daily or 5 mg IM if patient refuses p.o. - Zyprexa 5mg  PO qam or 5mg  IM if refuses PO; 10 mg qhs or 5 mg IM if refuses PO   Regular scheduled meds -Discontinue Geodon 80 mg BID -Start Haldol 5 mg twice daily -Continue Restoril 30 mg QHS -Continue Depakote ER 750mg   -(1/9) VA level: 117, CBC-WNL, hepatic function panel-AST 12  - follow-up repeat Depakote level (14) , CBC (WNL), and CMP - Continue Zyprexa 5 mg daily and 5mg  QHS -Continue  Amantadine 100mg  every other day , EPS prophylaxis -Continue Aricept 5mg  QHS for cognitive impairment, patient repsoding well, consider increase to BID in a 3-4 days EKG- QTC 437 (1/15) - Start Clozapine 12.5mg  qhs (pt refused dose last night 12-11-21). Will cross  titrate clozapine and olanzapine, if pt starts to accept clozapine.    Expected schedule for Clozaril titration:  Day 1: Start Clozaril 12.5mg  QHS, Decrease Zyprexa to 5mg  BID Day 2: Clozaril 12.5mg  BID, Zyprexa 5mg  BID Day 3: Clozaril 12.5 mg daily and 25mg  QHS, Zyprexa 2.5mg  daily and 5mg  QHS Day 4: Clozaril 25mg  daily and 37.5mg  QHS, Zyprexa 2.5mg  QHS Day 5: Clozaril 37.5mg  BID  REMS: [id number]   HTN   Tachycardia: -Continue Propanolol ER 60 mg once daily  -Continue to hold Losartan 25 mg daily - Creatinine 1.42> 1.60>1.29>1.29>1.52   AKI- improving, stable: Will push PO fluids -Encourage PO fluid intake: Gatorade and H2O   Hyperthyroidism: Spoke with hospitalist (1/3)  -Continue methimazole at 5 mg bid  -TSH (12/29)- 6.847>>> 12.328 (1/3)>>>6.397 (12-02-21) -Free T4 0.84 wnl (12-02-21)  -Follow up with PCP for further management   Constipation - Continue Miralax daily - KUB, noted distended bladder and moderate stool burden - Continue Aricept   Other PRNs:  -Continue Protonix 20 mg daily -Continue PRN's: Tylenol, Maalox, Atarax, Milk of Magnesia, Trazodone   Toe pain - Volatren gel QID   PGY-2 Freida Busman, MD 12/12/2021, 7:30 AM  Total Time Spent in Direct Patient Care:  I personally spent 30 minutes on the unit in direct patient care. The direct patient care time included face-to-face time with the  patient, reviewing the patient's chart, communicating with other professionals, and coordinating care. Greater than 50% of this time was spent in counseling or coordinating care with the patient regarding goals of hospitalization, psycho-education, and discharge planning needs.  I have independently evaluated the patient during a face-to-face assessment on 12/12/21. I reviewed the patient's chart, and I participated in key portions of the service. I discussed the case with the Doctor, hospital, and I agree with the assessment and plan of care as documented in the House  Officer's note, as addended by me or notated below:  Pt is less agitate but remains psychotic and disorganized.  She cannot care for herself outside the hospital, due to severity of psychiatric illness and symptoms.  Pt responded better to haldol than geodon. We will switch geodon to haldol. We will attempt to cross titrate from olanzapine to clozapine, if patients does not refuse clozapine. She will agree to start clozapine during the day, but later refuse clozapine at night.    Janine Limbo, MD Psychiatrist

## 2021-12-12 NOTE — Progress Notes (Signed)
Adult Psychoeducational Group Note  Date:  12/12/2021 Time:  10:24 PM  Group Topic/Focus:  Wrap-Up Group:   The focus of this group is to help patients review their daily goal of treatment and discuss progress on daily workbooks.  Participation Level:  Minimal  Participation Quality:  Redirectable  Affect:  Appropriate  Cognitive:  Disorganized  Insight: Limited  Engagement in Group:  Off Topic  Modes of Intervention:  Discussion  Additional Comments:  Pt attended the evening wrap-up group. Tech introduced the staff for the evening, reminded group of the evening schedule and reminded them to ask for anything they need. Pt reported feeling 6 out of 10 today, with 1 being the worst and 10 being the best.  The pt is currently aligned to their daily goal. Additionally, the pt reports having spoken to their brother about their progress via phone. Pt subsequently explored their current concerns from the day. Pt reported they did not have any auditory hallucinations. Pt reported they did not have any visual hallucinations. Pt reported they are not currently having any thoughts of harming themselves or others. Pt reported having slept good the previous night. Lastly, pt indicated they are having pain but declined to share in group. Group ended with a reminder of when night medications will be dispensed and the rest of the evening schedule.  Amie Critchley 12/12/2021, 10:24 PM

## 2021-12-13 DIAGNOSIS — F250 Schizoaffective disorder, bipolar type: Secondary | ICD-10-CM | POA: Diagnosis not present

## 2021-12-13 MED ORDER — CLOZAPINE 25 MG PO TABS
12.5000 mg | ORAL_TABLET | Freq: Every day | ORAL | Status: DC
  Administered 2021-12-17: 12.5 mg via ORAL
  Filled 2021-12-13 (×6): qty 1

## 2021-12-13 MED ORDER — DIVALPROEX SODIUM ER 250 MG PO TB24
750.0000 mg | ORAL_TABLET | Freq: Every day | ORAL | Status: DC
  Administered 2021-12-17 – 2021-12-22 (×5): 750 mg via ORAL
  Filled 2021-12-13 (×11): qty 3
  Filled 2021-12-13: qty 21
  Filled 2021-12-13 (×4): qty 3

## 2021-12-13 MED ORDER — DONEPEZIL HCL 5 MG PO TABS
5.0000 mg | ORAL_TABLET | Freq: Every day | ORAL | Status: DC
  Administered 2021-12-17 – 2021-12-18 (×2): 5 mg via ORAL
  Filled 2021-12-13 (×13): qty 1

## 2021-12-13 MED ORDER — HALOPERIDOL LACTATE 5 MG/ML IJ SOLN
5.0000 mg | Freq: Two times a day (BID) | INTRAMUSCULAR | Status: DC
  Administered 2021-12-13: 5 mg via INTRAMUSCULAR
  Filled 2021-12-13 (×4): qty 1

## 2021-12-13 MED ORDER — OLANZAPINE 5 MG PO TABS
5.0000 mg | ORAL_TABLET | Freq: Two times a day (BID) | ORAL | Status: DC
  Administered 2021-12-14 – 2021-12-15 (×2): 5 mg via ORAL
  Filled 2021-12-13 (×6): qty 1

## 2021-12-13 MED ORDER — HALOPERIDOL 5 MG PO TABS
5.0000 mg | ORAL_TABLET | Freq: Two times a day (BID) | ORAL | Status: DC
  Administered 2021-12-14: 5 mg via ORAL
  Filled 2021-12-13 (×4): qty 1

## 2021-12-13 NOTE — Progress Notes (Addendum)
[facility] MD Progress Note  12/13/2021 9:32 AM Paula Dickson  MRN:  [id number] Subjective:  Paula Dickson is a 56 yr old female who presents with bizarre behaviors and nonsensical speech after medication noncompliance.  PPHx is significant for Schizoaffective Disorder- Bipolar type complicated by medication noncompliance and previous suicide attempt (OD in 2018), multiple hospitalizations (latest 11/21 [facility]).   Case was discussed in the multidisciplinary team.  MAR was reviewed Patient refused her QHS PO medications and received IM. Patient was more irritable overnight.   Psychiatry team made following the recommendations yesterday: - Discontinue Geodon 80 mg BID -Start Haldol 5 mg twice daily -Continue Restoril 30 mg QHS -Continue Depakote ER 750mg   -(1/9) VA level: 117, CBC-WNL, hepatic function panel-AST 12  - follow-up repeat Depakote level (14) , CBC (WNL), and CMP - Continue Zyprexa 5 mg daily and 5mg  QHS -Continue  Amantadine 100mg  every other day , EPS prophylaxis -Continue Aricept 5mg  QHS for cognitive impairment, patient repsoding well, consider increase to BID in a 3-4 days EKG- QTC 437 (1/15) - Start Clozapine 12.5mg  qhs (pt refused dose last night 12-11-21). Will cross titrate clozapine and olanzapine, if pt starts to accept clozapine.    On assessment today, the patient reports that she is scared to take her medications from specific staff at night. Patient reports that she thinks if a day time RN were to give her medication she would be more willing to take it. Patient reports that she has been using the list provided. Patient reports that she was able to write her old medication regimen down and shows the paper to provider. Patient's paper has 3 columns "Morning, Noon and Night" and she has her old medication regimen written it what appears to be an appropriate manner. Patient endorses that she is confused why she is taking more medications now than before.  Patient also spends some  time talking about her family and that she spoke to one of her 5 brothers this AM and other details about her family and past.  Pt reports that her mood is "fineI just don't know why I can't go home I can't go home it's not safe to go there but I have lots of good people who look after me."  Pt reports that sleep is off and on (per sleep log pt had poor sleep. Pt reports appetite is at baseline. Pt demonstrates poor concentration.  Patient denies SI, HI, and AVH.  Patient reports that she has had "visions" but reports that she has not had any recently.  Pt denies having any s/e to current medications that she is taking. When asked about her tremor, and discussed this could be 2/2 psychiatric medications, the pt states 'i'm not worried about that it doesn't bother me I dont want medication for that you give me too many medications already."   Principal Problem: Schizoaffective disorder, bipolar type ([place]) Diagnosis: Principal Problem:   Schizoaffective disorder, bipolar type ([place]) Active Problems:   Hyperthyroidism   Benign essential HTN   AKI (acute kidney injury) ([place])   Cognitive dysfunction in chronic schizophrenia ([place])  Total Time spent with patient: 15 minutes  Past Psychiatric History: See H&P  Past Medical History:  Past Medical History:  Diagnosis Date   Bipolar affective disorder ([place])    Bipolar disorder ([place])    History of arthritis    History of chicken pox    History of depression    History of genital warts    history of heart murmur  History of high blood pressure    History of thyroid disease    History of UTI    Hypertension    Low TSH level 07/13/2017   Schizophrenia ([place])     Past Surgical History:  Procedure Laterality Date   ABLATION ON ENDOMETRIOSIS     CYST REMOVAL NECK     around 11 years ago /benign   MULTIPLE TOOTH EXTRACTIONS     Family History:  Family History  Problem Relation Age of Onset   Arthritis Father    Hyperlipidemia Father     High blood pressure Father    Diabetes Sister    Diabetes Mother    Diabetes Brother    Mental illness Brother    Alcohol abuse Paternal Uncle    Alcohol abuse Paternal Grandfather    Breast cancer Maternal Aunt    Breast cancer Paternal Aunt    High blood pressure Sister    Mental illness Other        runs in family   Family Psychiatric  History: See H&P Social History:  Social History   Substance and Sexual Activity  Alcohol Use Not Currently     Social History   Substance and Sexual Activity  Drug Use Not Currently    Social History   Socioeconomic History   Marital status: Single    Spouse name: Not on file   Number of children: Not on file   Years of education: Not on file   Highest education level: Patient refused  Occupational History   Not on file  Tobacco Use   Smoking status: Never   Smokeless tobacco: Never  Vaping Use   Vaping Use: Never used  Substance and Sexual Activity   Alcohol use: Not Currently   Drug use: Not Currently   Sexual activity: Not Currently  Other Topics Concern   Not on file  Social History Narrative   ** Merged History Encounter **       Social Determinants of Health   Financial Resource Strain: Not on file  Food Insecurity: Not on file  Transportation Needs: Not on file  Physical Activity: Not on file  Stress: Not on file  Social Connections: Not on file   Additional Social History:                         Sleep: Poor  Appetite:  Good  Current Medications: Current Facility-Administered Medications  Medication Dose Route Frequency Provider Last Rate Last Admin   acetaminophen (TYLENOL) tablet 650 mg  650 mg Oral Q6H PRN Ethelene Hal, NP   650 mg at 11/27/21 2347   alum & mag hydroxide-simeth (MAALOX/MYLANTA) 200-200-20 MG/5ML suspension 30 mL  30 mL Oral Q4H PRN Ethelene Hal, NP       amantadine (SYMMETREL) capsule 100 mg  100 mg Oral QODAY Jossette Zirbel, Ovid Curd, MD   100 mg at 12/13/21  3762   cloZAPine (CLOZARIL) tablet 12.5 mg  12.5 mg Oral Q supper Damita Dunnings B, MD       diclofenac Sodium (VOLTAREN) 1 % topical gel 2 g  2 g Topical QID Damita Dunnings B, MD   2 g at 12/13/21 0841   diphenhydrAMINE (BENADRYL) injection 50 mg  50 mg Intramuscular Q6H PRN Anica Alcaraz, Ovid Curd, MD   50 mg at 12/12/21 2141   divalproex (DEPAKOTE ER) 24 hr tablet 750 mg  750 mg Oral Q supper Freida Busman, MD  donepezil (ARICEPT) tablet 5 mg  5 mg Oral Q supper Damita Dunnings B, MD       haloperidol (HALDOL) tablet 5 mg  5 mg Oral Q6H PRN Hill, Jackie Plum, MD       And   LORazepam (ATIVAN) tablet 1 mg  1 mg Oral Q6H PRN Hill, Jackie Plum, MD       haloperidol (HALDOL) tablet 5 mg  5 mg Oral BID Damita Dunnings B, MD       Or   haloperidol lactate (HALDOL) injection 5 mg  5 mg Intramuscular BID Damita Dunnings B, MD       haloperidol lactate (HALDOL) injection 5 mg  5 mg Intramuscular Q6H PRN Maida Sale, MD   5 mg at 12/11/21 2123   And   LORazepam (ATIVAN) injection 1 mg  1 mg Intramuscular Q6H PRN Maida Sale, MD   1 mg at 12/11/21 2122   temazepam (RESTORIL) capsule 30 mg  30 mg Oral QHS Damita Dunnings B, MD       Or   LORazepam (ATIVAN) injection 1 mg  1 mg Intravenous QHS Damita Dunnings B, MD   1 mg at 12/12/21 2141   melatonin tablet 10 mg  10 mg Oral QHS Hill, Jackie Plum, MD       methimazole (TAPAZOLE) tablet 5 mg  5 mg Oral BID Jenny Omdahl, Ovid Curd, MD   5 mg at 12/13/21 0839   OLANZapine (ZYPREXA) tablet 5 mg  5 mg Oral BID Damita Dunnings B, MD       pantoprazole (PROTONIX) EC tablet 20 mg  20 mg Oral Daily Rosezetta Schlatter, MD   20 mg at 12/13/21 0839   polyethylene glycol (MIRALAX / GLYCOLAX) packet 17 g  17 g Oral Daily Damita Dunnings B, MD   17 g at 12/13/21 0839   propranolol ER (INDERAL LA) 24 hr capsule 60 mg  60 mg Oral Daily Mckala Pantaleon, Ovid Curd, MD   60 mg at 12/13/21 0840   white petrolatum (VASELINE) gel   Topical PRN Rosezetta Schlatter, MD   Given  at 12/04/21 1516    Lab Results: No results found for this or any previous visit (from the past 48 hour(s)).  Blood Alcohol level:  Lab Results  Component Value Date   ETH <10 11/22/2021   ETH <10 85/88/5027    Metabolic Disorder Labs: Lab Results  Component Value Date   HGBA1C 6.1 (H) 11/24/2021   MPG 128.37 11/24/2021   MPG 119.76 03/28/2021   Lab Results  Component Value Date   PROLACTIN 11.0 03/14/2019   Lab Results  Component Value Date   CHOL 127 11/24/2021   TRIG 161 (H) 11/24/2021   HDL 39 (L) 11/24/2021   CHOLHDL 3.3 11/24/2021   VLDL 32 11/24/2021   LDLCALC 56 11/24/2021   LDLCALC 95 10/26/2020    Physical Findings: AIMS: Facial and Oral Movements Muscles of Facial Expression: None, normal Lips and Perioral Area: None, normal Jaw: None, normal Tongue: None, normal,Extremity Movements Upper (arms, wrists, hands, fingers): None, normal Lower (legs, knees, ankles, toes): None, normal, Trunk Movements Neck, shoulders, hips: Mild, Overall Severity Severity of abnormal movements (highest score from questions above): None, normal Incapacitation due to abnormal movements: None, normal Patient's awareness of abnormal movements (rate only patient's report): No Awareness, Dental Status Current problems with teeth and/or dentures?: No Does patient usually wear dentures?: No  CIWA:    COWS:     Musculoskeletal: Strength & Muscle Tone: within normal  limits Gait & Station: normal Patient leans: N/A  Psychiatric Specialty Exam:  Presentation  General Appearance: Casual  Eye Contact:Fleeting  Speech:Clear and Coherent (hyperverbal)  Speech Volume:Normal  Handedness:Right   Mood and Affect  Mood:Labile  Affect:Congruent   Thought Process  Thought Processes:Disorganized  Descriptions of Associations:Tangential  Orientation:Full (Time, Place and Person)  Thought Content:Scattered; Tangential  History of Schizophrenia/Schizoaffective  disorder:Yes  Duration of Psychotic Symptoms:Greater than six months  Hallucinations: denies AH, VH Ideas of Reference:None  Suicidal Thoughtsdenies  Homicidal Thoughts:denies   Sensorium  Memory:Immediate Fair; Recent Poor  Judgment:Impaired  Insight:Shallow   Executive Functions  Concentration:Poor  Attention Span:Poor  [place]   Psychomotor Activity  Psychomotor Activity:No data recorded  Assets  Assets:Resilience   Sleep  Sleep:No data recorded   Physical Exam: Physical Exam Constitutional:      Appearance: Normal appearance.  HENT:     Head: Normocephalic and atraumatic.  Pulmonary:     Effort: Pulmonary effort is normal.  Neurological:     Mental Status: She is alert and oriented to person, place, and time.   Review of Systems  Psychiatric/Behavioral:  Negative for hallucinations and suicidal ideas. The patient has insomnia.    Blood pressure (!) 143/94, pulse 84, temperature 98.1 F (36.7 C), temperature source Oral, resp. rate 16, height 5' (1.524 m), weight 76.7 kg, SpO2 98 %. Body mass index is 33.01 kg/m.   Treatment Plan Summary: Daily contact with patient to assess and evaluate symptoms and progress in treatment and Medication management Paula Dickson is a 56 yo patient w/ PPH of schizophrenia. Patient appears to be responding well to Haldol, despite the fact she is requring more IM. Patient's IM appears to be somewhat associated with environment and may improve if patient is offered her PM dose of medication during first shift. Patient is more pleasant overall during the day, she continues to be disorganized but she is more goal oriented when answering questions and is able to stay on topic a bit longer than prior to Haldol. Patient also appears to be attempting to organize her medication and be part of care more. Patient is also noted to be less irritated during some assessments but she still looks oddly  at staff throughout the day. Patient has yet to start her Clozaril.  Schizoaffective Disorder- Bipolar type: IVC first eval completed 12-02-21. IVC served to pt 12-02-21. 24 hour eval completed 12-02-21.   FORCED MED ORDER Started 1/5  - 1/12!!!! Second FORCED MED ORDER 1/13-1/20!!! -Haldol 5 mg twice daily or 5 mg IM if patient refuses p.o. - Zyprexa 5mg  PO qam. We will not order IM zyprexa, as pt will already be receiving IM haldol if refusing PO haldol, and we want to avoid administering 2 different antipsychotics as IM at the same time, for refusal of PO medications.    Regular scheduled medications ( QHS doses have been moved to 1700 and 1800) -Continue Haldol 5 mg twice daily (IM back-up if pt refuses) -Continue Restoril 30 mg QHS -Continue Depakote ER 750mg   -(1/9) VA level: 117, CBC-WNL, hepatic function panel-AST 12  - follow-up repeat Depakote level (14) , CBC (WNL), and CMP - Continue Zyprexa 5 mg daily and 5mg  QHS (no IM back-up if pt refuses) -Continue  Amantadine 100mg  every other day, for EPS prophylaxis. Pt's tremor has worsened since reducing amantadine, but her psychosis has lessened as well. Pt offered other medication for tremor, but declined.  -Continue Aricept 5mg  QHS for  cognitive impairment, patient repsoding well, consider increase to BID in a 3-4 days EKG- QTC 437 (1/15) - Start Clozapine 12.5mg  qhs (pt refused dose last night 12-11-21). Will cross titrate clozapine and olanzapine, if pt starts to accept clozapine.    Expected schedule for Clozaril titration:  Day 1: Start Clozaril 12.5mg  QHS, Decrease Zyprexa to 5mg  BID Day 2: Clozaril 12.5mg  BID, Zyprexa 5mg  BID Day 3: Clozaril 12.5 mg daily and 25mg  QHS, Zyprexa 2.5mg  daily and 5mg  QHS Day 4: Clozaril 25mg  daily and 37.5mg  QHS, Zyprexa 2.5mg  QHS Day 5: Clozaril 37.5mg  BID  REMS: [id number]   HTN   Tachycardia: -Continue Propanolol ER 60 mg once daily  -Continue to hold Losartan 25 mg daily - Creatinine 1.42>  1.60>1.29>1.29>1.52   AKI- improving, stable: Will push PO fluids -Encourage PO fluid intake: Gatorade and H2O   Hyperthyroidism: Spoke with hospitalist (1/3)  -Continue methimazole at 5 mg bid  -TSH (12/29)- 6.847>>> 12.328 (1/3)>>>6.397 (12-02-21) -Free T4 0.84 wnl (12-02-21)  -Follow up with PCP for further management   Constipation - Continue Miralax daily - KUB, noted distended bladder and moderate stool burden - Continue Aricept   Other PRNs:  -Continue Protonix 20 mg daily -Continue PRN's: Tylenol, Maalox, Atarax, Milk of Magnesia, Trazodone   Toe pain - Volatren gel QID    PGY-2 Freida Busman, MD 12/13/2021, 9:32 AM  Total Time Spent in Direct Patient Care:  I personally spent 30 minutes on the unit in direct patient care. The direct patient care time included face-to-face time with the patient, reviewing the patient's chart, communicating with other professionals, and coordinating care. Greater than 50% of this time was spent in counseling or coordinating care with the patient regarding goals of hospitalization, psycho-education, and discharge planning needs.  I have independently evaluated the patient during a face-to-face assessment on 12/13/21. I reviewed the patient's chart, and I participated in key portions of the service. I discussed the case with the [employer], and I agree with the assessment and plan of care as documented in the House Officer's note, as addended by me or notated below:  I directly edited the note, as above. BMP and other labs ordered for tomorrow morning.   Janine Limbo, MD Psychiatrist

## 2021-12-13 NOTE — Progress Notes (Signed)
Pt has been sleep much of the evening due to getting IMs  earlier     12/13/21 2100  Psych Admission Type (Psych Patients Only)  Admission Status Involuntary  Psychosocial Assessment  Patient Complaints Anxiety  Eye Contact Staring  Facial Expression Angry  Affect Irritable  Speech Incoherent;Pressured;Rapid;Slurred;Word salad;Argumentative  Interaction Assertive;Demanding;Sarcastic;Intrusive  Motor Activity Shuffling  Appearance/Hygiene Improved  Behavior Characteristics Cooperative  Mood Preoccupied  Aggressive Behavior  Effect No apparent injury  Thought Process  Coherency Disorganized  Content Religiosity  Delusions Religious  Perception Hallucinations  Hallucination Auditory;Visual  Judgment Poor  Confusion Moderate  Danger to Self  Current suicidal ideation? Denies  Danger to Others  Danger to Others None reported or observed

## 2021-12-13 NOTE — [facility] Group Notes (Signed)
[place] Group Notes:  (Nursing/MHT/Case Management/Adjunct)  Date:  12/13/2021  Time:  9:40 AM  Type of Therapy:   Orientation/Goals group  Participation Level:  Minimal  Participation Quality:  Appropriate  Affect:  Appropriate  Cognitive:  Disorganized and Confused  Insight:  Improving and Lacking  Engagement in Group:  Distracting, Improving, and Off Topic  Modes of Intervention:  Education, Exploration, and Orientation  Summary of Progress/Problems: Pt goal for today is to get medications on time, eat good food and find out when she can leave.   Tiare Rohlman J Basem Yannuzzi 12/13/2021, 9:40 AM

## 2021-12-13 NOTE — Group Note (Signed)
Recreation Therapy Group Note   Group Topic:Goal Setting  Group Date: 12/13/2021 Start Time: 7564 End Time: 3329 Facilitators: [name], LRT,CTRS Location: [address] Dayroom   Goal Area(s) Addresses:  Patient will participate in discussion of what a goal is. Patient will successfully identify a goal for various time frames. Patient will identify the importance of goals post d/c.  Group Description: Group started with a discussion about group rules. Patients had a group conversation on SMART goals, and what the acronym stands for; specific, measurable, attainable, relevant, and time-bound. Patients were given a worksheet that gave four time frames (week, month, year and 5 years) to complete goals.  Patients then identified obstacles to prevent reaching goals, what's needed to achieve goals and what can be done now to start working towards goals.   Affect/Mood: Appropriate   Participation Level: Moderate and Hyperverbal   Participation Quality: Minimal Cues   Behavior: Hyperverbal, Off-task, and Preoccupied   Speech/Thought Process: Loose association   Insight: Fair   Judgement: Fair    Modes of Intervention: Music and Worksheet   Patient Response to Interventions:  Attentive   Education Outcome:  Acknowledges education and In group clarification offered    Clinical Observations/Individualized Feedback: Pt came in late to group but was brought up to speed with everyone else.  Pt was still hypervebal and manic in talking.  Pt didn't complete but was able to verbally give her answers.  Pt expressed in a week wanting to go home to "take care of some business".  In a month, make sure the doors to home secure.  In a year, make sure a place is established where she can have peace of mind.  In five years wants to be established in a career.  Pt needed to be redirected multiple time to stay on topic because pt would begin rambling about getting to her brother's house, people going  in her home and the clothes she keeps because her weight fluctuates.      Plan: Continue to engage patient in RT group sessions 2-3x/week.   [name], LRT,CTRS 12/13/2021 11:54 AM

## 2021-12-13 NOTE — Progress Notes (Signed)
°   12/13/21 0545  Sleep  Number of Hours 2.5

## 2021-12-13 NOTE — Progress Notes (Signed)
°   12/13/21 1600  Psych Admission Type (Psych Patients Only)  Admission Status Involuntary  Psychosocial Assessment  Patient Complaints Anxiety  Eye Contact Staring  Facial Expression Angry  Affect Irritable  Speech Incoherent;Pressured;Rapid;Slurred;Word salad;Argumentative  Interaction Assertive;Demanding;Sarcastic;Intrusive  Motor Activity Shuffling  Appearance/Hygiene Improved  Behavior Characteristics Cooperative  Mood Preoccupied  Aggressive Behavior  Effect No apparent injury  Thought Process  Coherency Disorganized  Content Religiosity  Delusions Religious  Perception Hallucinations  Hallucination Auditory;Visual  Judgment Poor  Confusion Moderate  Danger to Self  Current suicidal ideation? Denies  Danger to Others  Danger to Others None reported or observed

## 2021-12-14 DIAGNOSIS — F250 Schizoaffective disorder, bipolar type: Secondary | ICD-10-CM | POA: Diagnosis not present

## 2021-12-14 LAB — BASIC METABOLIC PANEL
Anion gap: 7 (ref 5–15)
BUN: 24 mg/dL — ABNORMAL HIGH (ref 6–20)
CO2: 28 mmol/L (ref 22–32)
Calcium: 9.9 mg/dL (ref 8.9–10.3)
Chloride: 105 mmol/L (ref 98–111)
Creatinine, Ser: 1.33 mg/dL — ABNORMAL HIGH (ref 0.44–1.00)
GFR, Estimated: 47 mL/min — ABNORMAL LOW (ref >60)
Glucose, Bld: 133 mg/dL — ABNORMAL HIGH (ref 70–99)
Potassium: 4.5 mmol/L (ref 3.5–5.1)
Sodium: 140 mmol/L (ref 135–145)

## 2021-12-14 LAB — TROPONIN I (HIGH SENSITIVITY): Troponin I (High Sensitivity): 2 ng/L (ref <18)

## 2021-12-14 LAB — C-REACTIVE PROTEIN: CRP: 0.5 mg/dL (ref <1.0)

## 2021-12-14 MED ORDER — HALOPERIDOL 5 MG PO TABS
5.0000 mg | ORAL_TABLET | Freq: Three times a day (TID) | ORAL | Status: DC
  Administered 2021-12-15 – 2021-12-17 (×4): 5 mg via ORAL
  Filled 2021-12-14 (×14): qty 1

## 2021-12-14 MED ORDER — LORAZEPAM 2 MG/ML IJ SOLN
1.0000 mg | Freq: Four times a day (QID) | INTRAMUSCULAR | Status: DC | PRN
  Administered 2021-12-14 – 2021-12-19 (×2): 1 mg via INTRAMUSCULAR
  Filled 2021-12-14 (×2): qty 1

## 2021-12-14 MED ORDER — HALOPERIDOL LACTATE 5 MG/ML IJ SOLN
5.0000 mg | Freq: Three times a day (TID) | INTRAMUSCULAR | Status: DC
  Administered 2021-12-14 – 2021-12-16 (×7): 5 mg via INTRAMUSCULAR
  Filled 2021-12-14 (×15): qty 1

## 2021-12-14 NOTE — Progress Notes (Signed)
Adult Psychoeducational Group Note  Date:  12/14/2021 Time:  1:40 AM  Group Topic/Focus:  Wrap-Up Group:   The focus of this group is to help patients review their daily goal of treatment and discuss progress on daily workbooks.  Participation Level:  Did Not Attend  Participation Quality:   DNA  Affect:   DNA  Cognitive:   DNA  Insight: Improving  Engagement in Group:  None  Modes of Intervention:  Discussion  Additional Comments:  Pt did not attend group.  Amie Critchley 12/14/2021, 1:40 AM

## 2021-12-14 NOTE — Group Note (Signed)
Recreation Therapy Group Note   Group Topic:Team Building  Group Date: 12/14/2021 Start Time: 1005 End Time: 1035 Facilitators: [name], LRT,CTRS Location: [address] Dayroom   Goal Area(s) Addresses:  Patient will effectively work with peer towards shared goal.  Patient will identify skills used to make activity successful.  Patient will identify how skills used during activity can be applied to reach post d/c goals.   Group Description: [employer]. In teams of 5-6, patients were given 15 craft pipe cleaners. Using the materials provided, patients were instructed to compete againt the opposing team(s) to build the tallest free-standing structure from floor level. The activity was timed; difficulty increased by Probation officer as Pharmacist, hospital continued.  Systematically resources were removed with additional directions for example, placing one arm behind their back, working in silence, and shape stipulations. LRT facilitated post-activity discussion reviewing team processes and necessary communication skills involved in completion. Patients were encouraged to reflect how the skills utilized, or not utilized, in this activity can be incorporated to positively impact support systems post discharge.   Affect/Mood: Anxious and Manic   Participation Level: Hyperverbal   Participation Quality: Maximum Cues   Behavior: Disruptive and Off-task   Speech/Thought Process: Disorganized and Pressured   Insight: Lacking   Judgement: Lacking    Modes of Intervention: Team-building   Patient Response to Interventions:  Disinterested   Education Outcome:  Acknowledges education and In group clarification offered    Clinical Observations/Individualized Feedback: Pt came in near the end of group.  Pt was laughing uncontrollably and speaking about things that made no sense.  Pt could not focus and was distracted by whatever she was talking about.     Plan: Continue to engage  patient in RT group sessions 2-3x/week.   [name], LRT,CTRS 12/14/2021 12:15 PM

## 2021-12-14 NOTE — [facility] Group Notes (Signed)
The focus of this group is to help patients review their daily goal of treatment and discuss progress on daily workbooks.Pt didn't attend wrap up group.  

## 2021-12-14 NOTE — Progress Notes (Signed)
[facility] MD Progress Note  12/14/2021 1:52 PM Paula Dickson  MRN:  [id number] Subjective:  Paula Dickson is a 56 yr old female who presents with bizarre behaviors and nonsensical speech after medication noncompliance.  PPHx is significant for Schizoaffective Disorder- Bipolar type complicated by medication noncompliance and previous suicide attempt (OD in 2018), multiple hospitalizations (latest 11/21 [facility]).   Case was discussed in the multidisciplinary team.  MAR was reviewed Patient refused her QHS PO medications and received IM. Patient was more irritable overnight.   Psychiatry team made following the recommendations yesterday: IVC first eval completed 12-02-21. IVC served to pt 12-02-21. 24 hour eval completed 12-02-21.   FORCED MED ORDER Started 1/5  - 1/12!!!! Second FORCED MED ORDER 1/13-1/20!!! -Haldol 5 mg twice daily or 5 mg IM if patient refuses p.o. - Zyprexa 5mg  PO qam. We will not order IM zyprexa, as pt will already be receiving IM haldol if refusing PO haldol, and we want to avoid administering 2 different antipsychotics as IM at the same time, for refusal of PO medications.    Regular scheduled medications ( QHS doses have been moved to 1700 and 1800) -Continue Haldol 5 mg twice daily (IM back-up if pt refuses) -Continue Restoril 30 mg QHS -Continue Depakote ER 750mg   -(1/9) VA level: 117, CBC-WNL, hepatic function panel-AST 12  - follow-up repeat Depakote level (14) , CBC (WNL), and CMP - Continue Zyprexa 5 mg daily and 5mg  QHS (no IM back-up if pt refuses) -Continue  Amantadine 100mg  every other day, for EPS prophylaxis. Pt's tremor has worsened since reducing amantadine, but her psychosis has lessened as well. Pt offered other medication for tremor, but declined.  -Continue Aricept 5mg  QHS for cognitive impairment, patient repsoding well, consider increase to BID in a 3-4 days EKG- QTC 437 (1/15) - Start Clozapine 12.5mg  qhs (pt refused dose last night 12-11-21). Will cross  titrate clozapine and olanzapine, if pt starts to accept clozapine.    Despite adjusting the timing of patient's QHS dose, patient continued to resist PO medications and required IM medications. Per SW patient has asked about receiving IM Ativan by name as she likes the way she feels after receiving this.   On assessment this AM patient is seen by Attending provider, resident provider, RN, and student RN at the same time. Patient appeared very happy with the presence of student RN. Patient spoke more pleasantly to student RN and endorsed that she felt that her care was incorrect on the unit. Patient reports that she is eating well and believes she is getting rest throughout the day and night. Patient reported that she does not like her medication regimen and this is why she refuses her night time meds. Patient reports that she is upset that her medications are what she believes they should be. Patient is not able to provide specifics on what medications she believes she should be on. Patient has multiple pieces of paper with medications on them and shows them attempting to provide proof that her medications are incorrect. Patient does not wish to listen to staff explanations of her medications. Patient denies SI, HI, and AVH on assessment. Patient reports she has had AVH but "not today."  Principal Problem: Schizoaffective disorder, bipolar type ([place]) Diagnosis: Principal Problem:   Schizoaffective disorder, bipolar type ([place]) Active Problems:   Hyperthyroidism   Benign essential HTN   AKI (acute kidney injury) ([place])   Cognitive dysfunction in chronic schizophrenia ([place])  Total Time spent with patient: 20 minutes  Past Psychiatric History: See H&P  Past Medical History:  Past Medical History:  Diagnosis Date   Bipolar affective disorder ([place])    Bipolar disorder ([place])    History of arthritis    History of chicken pox    History of depression    History of genital warts    history of heart  murmur    History of high blood pressure    History of thyroid disease    History of UTI    Hypertension    Low TSH level 07/13/2017   Schizophrenia ([place])     Past Surgical History:  Procedure Laterality Date   ABLATION ON ENDOMETRIOSIS     CYST REMOVAL NECK     around 11 years ago /benign   MULTIPLE TOOTH EXTRACTIONS     Family History:  Family History  Problem Relation Age of Onset   Arthritis Father    Hyperlipidemia Father    High blood pressure Father    Diabetes Sister    Diabetes Mother    Diabetes Brother    Mental illness Brother    Alcohol abuse Paternal Uncle    Alcohol abuse Paternal Grandfather    Breast cancer Maternal Aunt    Breast cancer Paternal Aunt    High blood pressure Sister    Mental illness Other        runs in family   Family Psychiatric  History: See H&P Social History:  Social History   Substance and Sexual Activity  Alcohol Use Not Currently     Social History   Substance and Sexual Activity  Drug Use Not Currently    Social History   Socioeconomic History   Marital status: Single    Spouse name: Not on file   Number of children: Not on file   Years of education: Not on file   Highest education level: Patient refused  Occupational History   Not on file  Tobacco Use   Smoking status: Never   Smokeless tobacco: Never  Vaping Use   Vaping Use: Never used  Substance and Sexual Activity   Alcohol use: Not Currently   Drug use: Not Currently   Sexual activity: Not Currently  Other Topics Concern   Not on file  Social History Narrative   ** Merged History Encounter **       Social Determinants of Health   Financial Resource Strain: Not on file  Food Insecurity: Not on file  Transportation Needs: Not on file  Physical Activity: Not on file  Stress: Not on file  Social Connections: Not on file   Additional Social History:                         Sleep: Poor  Appetite:  Good  Current  Medications: Current Facility-Administered Medications  Medication Dose Route Frequency Provider Last Rate Last Admin   acetaminophen (TYLENOL) tablet 650 mg  650 mg Oral Q6H PRN Ethelene Hal, NP   650 mg at 11/27/21 2347   alum & mag hydroxide-simeth (MAALOX/MYLANTA) 200-200-20 MG/5ML suspension 30 mL  30 mL Oral Q4H PRN Ethelene Hal, NP       amantadine (SYMMETREL) capsule 100 mg  100 mg Oral Michaelle Copas, MD   100 mg at 12/13/21 0839   cloZAPine (CLOZARIL) tablet 12.5 mg  12.5 mg Oral Q supper Damita Dunnings B, MD       diclofenac Sodium (VOLTAREN) 1 % topical gel 2 g  2 g Topical QID Damita Dunnings B, MD   2 g at 12/13/21 0841   diphenhydrAMINE (BENADRYL) injection 50 mg  50 mg Intramuscular Q6H PRN Janine Limbo, MD   50 mg at 12/13/21 2250   divalproex (DEPAKOTE ER) 24 hr tablet 750 mg  750 mg Oral Q supper Damita Dunnings B, MD       donepezil (ARICEPT) tablet 5 mg  5 mg Oral Q supper Damita Dunnings B, MD       haloperidol (HALDOL) tablet 5 mg  5 mg Oral Q6H PRN Hill, Jackie Plum, MD       And   LORazepam (ATIVAN) tablet 1 mg  1 mg Oral Q6H PRN Hill, Jackie Plum, MD       haloperidol (HALDOL) tablet 5 mg  5 mg Oral TID Damita Dunnings B, MD       Or   haloperidol lactate (HALDOL) injection 5 mg  5 mg Intramuscular TID Damita Dunnings B, MD       haloperidol lactate (HALDOL) injection 5 mg  5 mg Intramuscular Q6H PRN Maida Sale, MD   5 mg at 12/11/21 2123   melatonin tablet 10 mg  10 mg Oral QHS Hill, Jackie Plum, MD       methimazole (TAPAZOLE) tablet 5 mg  5 mg Oral BID Massengill, Ovid Curd, MD   5 mg at 12/14/21 0932   OLANZapine (ZYPREXA) tablet 5 mg  5 mg Oral BID Damita Dunnings B, MD   5 mg at 12/14/21 0932   pantoprazole (PROTONIX) EC tablet 20 mg  20 mg Oral Daily Rosezetta Schlatter, MD   20 mg at 12/14/21 0932   polyethylene glycol (MIRALAX / GLYCOLAX) packet 17 g  17 g Oral Daily Damita Dunnings B, MD   17 g at 12/13/21 0086    propranolol ER (INDERAL LA) 24 hr capsule 60 mg  60 mg Oral Daily Massengill, Ovid Curd, MD   60 mg at 12/14/21 0932   temazepam (RESTORIL) capsule 30 mg  30 mg Oral QHS Freida Busman, MD       white petrolatum (VASELINE) gel   Topical PRN Rosezetta Schlatter, MD   Given at 12/04/21 1516    Lab Results:  Results for orders placed or performed during the hospital encounter of 11/23/21 (from the past 48 hour(s))  Basic metabolic panel     Status: Abnormal   Collection Time: 12/14/21  6:33 AM  Result Value Ref Range   Sodium 140 135 - 145 mmol/L   Potassium 4.5 3.5 - 5.1 mmol/L   Chloride 105 98 - 111 mmol/L   CO2 28 22 - 32 mmol/L   Glucose, Bld 133 (H) 70 - 99 mg/dL    Comment: Glucose reference range applies only to samples taken after fasting for at least 8 hours.   BUN 24 (H) 6 - 20 mg/dL   Creatinine, Ser 1.33 (H) 0.44 - 1.00 mg/dL   Calcium 9.9 8.9 - 10.3 mg/dL   GFR, Estimated 47 (L) >60 mL/min    Comment: (NOTE) Calculated using the CKD-EPI Creatinine Equation (2021)    Anion gap 7 5 - 15    Comment: Performed at [facility], [place] [address] [place], [place] [address]  C-reactive protein     Status: None   Collection Time: 12/14/21  6:33 AM  Result Value Ref Range   CRP 0.5 <1.0 mg/dL    Comment: Performed at [facility], [place] [address], [place], [place]  [address]  Troponin I (High Sensitivity)     Status: None   Collection Time: 12/14/21  6:33 AM  Result Value Ref Range   Troponin I (High Sensitivity) 2 <18 ng/L    Comment: (NOTE) Elevated high sensitivity troponin I (hsTnI) values and significant  changes across serial measurements may suggest ACS but many other  chronic and acute conditions are known to elevate hsTnI results.  Refer to the Links section for chest pain algorithms and additional  guidance. Performed at  [facility], [place] [address], [place], [place] [address]     Blood Alcohol level:  Lab Results  Component  Value Date   [facility] <10 11/22/2021   ETH <10 26/83/4196    Metabolic Disorder Labs: Lab Results  Component Value Date   HGBA1C 6.1 (H) 11/24/2021   MPG 128.37 11/24/2021   MPG 119.76 03/28/2021   Lab Results  Component Value Date   PROLACTIN 11.0 03/14/2019   Lab Results  Component Value Date   CHOL 127 11/24/2021   TRIG 161 (H) 11/24/2021   HDL 39 (L) 11/24/2021   CHOLHDL 3.3 11/24/2021   VLDL 32 11/24/2021   LDLCALC 56 11/24/2021   LDLCALC 95 10/26/2020    Physical Findings: AIMS: Facial and Oral Movements Muscles of Facial Expression: None, normal Lips and Perioral Area: None, normal Jaw: None, normal Tongue: None, normal,Extremity Movements Upper (arms, wrists, hands, fingers): None, normal Lower (legs, knees, ankles, toes): None, normal, Trunk Movements Neck, shoulders, hips: Mild, Overall Severity Severity of abnormal movements (highest score from questions above): None, normal Incapacitation due to abnormal movements: None, normal Patient's awareness of abnormal movements (rate only patient's report): No Awareness, Dental Status Current problems with teeth and/or dentures?: No Does patient usually wear dentures?: No  CIWA:    COWS:     Musculoskeletal: Strength & Muscle Tone: within normal limits Gait & Station: normal Patient leans: N/A  Psychiatric Specialty Exam:  Presentation  General Appearance: Casual (in psych scrubs)  Eye Contact:Fleeting  Speech:Clear and Coherent; Pressured  Speech Volume:Normal  Handedness:Right   Mood and Affect  Mood:Irritable; Labile  Affect:Congruent   Thought Process  Thought Processes:Disorganized  Descriptions of Associations:Tangential  Orientation:Full (Time, Place and Person)  Thought Content:Illogical; Tangential; Delusions  History of Schizophrenia/Schizoaffective disorder:Yes  Duration of Psychotic Symptoms:Greater than six months  Hallucinations:Hallucinations: None  Ideas of  Reference:Delusions  Suicidal Thoughts:Suicidal Thoughts: No  Homicidal Thoughts:Homicidal Thoughts: No   Sensorium  Memory:Immediate Poor; Recent Poor; Remote Poor  Judgment:Impaired  Insight:Shallow   Executive Functions  Concentration:Poor  Attention Span:Poor  Recall:Poor  Fund of Knowledge:Poor  Language:Poor   Psychomotor Activity  Psychomotor Activity:Psychomotor Activity: Normal   Assets  Assets:Resilience   Sleep  Sleep:Sleep: Poor    Physical Exam: Physical Exam HENT:     Head: Normocephalic and atraumatic.  Pulmonary:     Effort: Pulmonary effort is normal.  Neurological:     Mental Status: She is alert.   Review of Systems  Psychiatric/Behavioral:  Negative for hallucinations and suicidal ideas. The patient has insomnia.   Blood pressure (!) 134/93, pulse 88, temperature 97.9 F (36.6 C), temperature source Oral, resp. rate 16, height 5' (1.524 m), weight 76.7 kg, SpO2 99 %. Body mass index is 33.01 kg/m.   Treatment Plan Summary: Daily contact with patient to assess and evaluate symptoms and progress in treatment and Medication management Paula Dickson is a 56 yo patient w/ PPH of schizophrenia. Patient continues to fluctuate in her mental status everyday. Patient  is not receiving the recommended scheduled medications, because she is refusing her QHS doses and is receiving the available IM forms under forced med order. This is unfortunate because patient is not able to receive all of the recommended medications. Patient has yet to start her Clozaril. Patient is noticeably declining in mood stability is becoming irritable more often during the day. Patient is also not sleeping well. There was some concern from staff that patient does like the IM Ativan and may be refusing scheduled PM meds in an attempt to get Ativan. Patient is more paranoid of staff and is selective (likely not intentional) in her memory of staff. Some of patient's poor memory appears  to 2/2 to her decrease attention span and concentration. Patient continues to be disorganized and tangential in her conversations, today she was also preoccupied with her medication regimen and her beliefs that it is incorrect. Despite patient herself denying AVH today, patient has been witnessed by provider responding to stimuli, talking to herself in her room.  Schizoaffective Disorder- Bipolar type: IVC first eval completed 12-02-21. IVC served to pt 12-02-21. 24 hour eval completed 12-02-21.   FORCED MED ORDER Started 1/5  - 1/12!!!! Second FORCED MED ORDER 1/13-1/20!!! -Haldol 5 mg twice daily or 5 mg IM if patient refuses p.o. - Zyprexa 5mg  PO qam. We will not order IM zyprexa, as pt will already be receiving IM haldol if refusing PO haldol, and we want to avoid administering 2 different antipsychotics as IM at the same time, for refusal of PO medications.    Regular scheduled medications ( QHS doses have been moved to 1700 and 1800) -Continue Haldol 5 mg TID (IM back-up if pt refuses) -Continue Restoril 30 mg QHS (discontinued IM Ativan if refused) -Continue Depakote ER 750mg   -(1/9) VA level: 117, CBC-WNL, hepatic function panel-AST 12 , follow-up repeat Depakote level (14) , CBC (WNL), and CMP - Increase  Zyprexa 5 mg BID  (no IM back-up if pt refuses) -Continue  Amantadine 100mg  every other day, for EPS prophylaxis. Pt's tremor has worsened since reducing amantadine, but her psychosis has lessened as well. Pt offered other medication for tremor, but declined.  -Continue Aricept 5mg  QHS for cognitive impairment, patient repsoding well, consider increase to BID in a 3-4 days EKG- QTC 437 (1/15) - Start Clozapine 12.5mg  qhs (pt refused dose last night 12-13-21). Will cross titrate clozapine and olanzapine, if pt starts to accept clozapine.    Expected schedule for Clozaril titration:  Day 1: Start Clozaril 12.5mg  QHS, Decrease Zyprexa to 5mg  BID Day 2: Clozaril 12.5mg  BID, Zyprexa 5mg   BID Day 3: Clozaril 12.5 mg daily and 25mg  QHS, Zyprexa 2.5mg  daily and 5mg  QHS Day 4: Clozaril 25mg  daily and 37.5mg  QHS, Zyprexa 2.5mg  QHS Day 5: Clozaril 37.5mg  BID  REMS: [id number]   HTN   Tachycardia: -Continue Propanolol ER 60 mg once daily  -Continue to hold Losartan 25 mg daily - Creatinine 1.42> 1.60>1.29>1.29>1.52   AKI- improving, stable: Will push PO fluids -Encourage PO fluid intake: Gatorade and H2O   Hyperthyroidism: Spoke with hospitalist (1/3)  -Continue methimazole at 5 mg bid  -TSH (12/29)- 6.847>>> 12.328 (1/3)>>>6.397 (12-02-21) -Free T4 0.84 wnl (12-02-21)  -Follow up with PCP for further management   Constipation - Continue Miralax daily - KUB, noted distended bladder and moderate stool burden - Continue Aricept   Other PRNs:  -Continue Protonix 20 mg daily -Continue PRN's: Tylenol, Maalox, Atarax, Milk of Magnesia, Trazodone   Toe pain - Volatren  gel QID   PGY-2 Freida Busman, MD 12/14/2021, 1:52 PM

## 2021-12-14 NOTE — Progress Notes (Signed)
Pt continues to refuse PO medications, pt received IMs from previous shift after 1900 for behavior earlier    12/14/21 2200  Psych Admission Type (Psych Patients Only)  Admission Status Involuntary  Psychosocial Assessment  Patient Complaints Anxiety;Agitation;Suspiciousness;Worrying  Eye Contact Staring  Facial Expression Animated  Affect Irritable  Speech Incoherent;Pressured;Rapid;Slurred;Word salad;Argumentative  Interaction Assertive;Demanding;Intrusive  Motor Activity Shuffling  Appearance/Hygiene Improved  Behavior Characteristics Intrusive;Irritable;Resistant to care  Mood Suspicious;Preoccupied;Labile  Aggressive Behavior  Effect No apparent injury  Thought Process  Coherency Disorganized  Content Religiosity  Delusions Religious  Perception Hallucinations  Hallucination Auditory;Visual  Judgment Poor  Confusion Moderate  Danger to Self  Current suicidal ideation? Denies  Danger to Others  Danger to Others None reported or observed

## 2021-12-14 NOTE — [facility] Group Notes (Signed)
Adult Psychoeducational Group Note  Date:  12/14/2021 Time:  10:11 AM  Group Topic/Focus:  Goals Group:   The focus of this group is to help patients establish daily goals to achieve during treatment and discuss how the patient can incorporate goal setting into their daily lives to aide in recovery.  Participation Level:  Active  Participation Quality:  Appropriate  Affect:  Appropriate  Cognitive:  Confused  Insight: Improving  Engagement in Group:  Engaged  Modes of Intervention:  Clarification    Dub Mikes 12/14/2021, 10:11 AM

## 2021-12-14 NOTE — Progress Notes (Signed)
Pt refusing to take all of this afternoon 1700 medications. Pt becomes agitated and is screaming on the unit.  5mg  IM haldol and 1 mg ativan IM administered to pt.

## 2021-12-14 NOTE — [facility] Group Notes (Signed)
1400 Relaxation Group- Pt was offered to attend but declined stating she would rather be in her room.

## 2021-12-14 NOTE — Group Note (Signed)
LCSW Group Therapy Note  Group Date: 12/14/2021 Start Time: 1300 End Time: 1400   Type of Therapy and Topic:  Group Therapy: Positive Affirmations  Participation Level:  Did Not Attend   Description of Group:   This group addressed positive affirmation towards self and others.  Patients went around the room and identified two positive things about themselves and two positive things about a peer in the room.  Patients reflected on how it felt to share something positive with others, to identify positive things about themselves, and to hear positive things from others/ Patients were encouraged to have a daily reflection of positive characteristics or circumstances.   Therapeutic Goals: Patients will verbalize two of their positive qualities Patients will demonstrate empathy for others by stating two positive qualities about a peer in the group Patients will verbalize their feelings when voicing positive self affirmations and when voicing positive affirmations of others Patients will discuss the potential positive impact on their wellness/recovery of focusing on positive traits of self and others.  Summary of Patient Progress:  Did not attend  Therapeutic Modalities:   Cognitive [place], LCSW 12/14/2021  1:47 PM

## 2021-12-14 NOTE — Progress Notes (Signed)
°   12/14/21 0608  Sleep  Number of Hours 3.25

## 2021-12-15 DIAGNOSIS — F250 Schizoaffective disorder, bipolar type: Secondary | ICD-10-CM | POA: Diagnosis not present

## 2021-12-15 MED ORDER — OLANZAPINE 10 MG IM SOLR
5.0000 mg | Freq: Two times a day (BID) | INTRAMUSCULAR | Status: DC
  Administered 2021-12-16 (×2): 5 mg via INTRAMUSCULAR
  Filled 2021-12-15 (×8): qty 10

## 2021-12-15 MED ORDER — OLANZAPINE 5 MG PO TBDP
5.0000 mg | ORAL_TABLET | Freq: Two times a day (BID) | ORAL | Status: DC
  Administered 2021-12-17 (×2): 5 mg via ORAL
  Filled 2021-12-15 (×9): qty 1

## 2021-12-15 MED ORDER — BENZTROPINE MESYLATE 1 MG PO TABS
1.0000 mg | ORAL_TABLET | Freq: Every day | ORAL | Status: DC
  Administered 2021-12-17: 1 mg via ORAL
  Filled 2021-12-15 (×5): qty 1

## 2021-12-15 MED ORDER — BENZTROPINE MESYLATE 1 MG/ML IJ SOLN
1.0000 mg | Freq: Every day | INTRAMUSCULAR | Status: DC
  Administered 2021-12-16: 1 mg via INTRAMUSCULAR
  Filled 2021-12-15 (×5): qty 1

## 2021-12-15 NOTE — Progress Notes (Signed)
°   12/15/21 1200  Psych Admission Type (Psych Patients Only)  Admission Status Involuntary  Psychosocial Assessment  Patient Complaints Suspiciousness;Irritability  Eye Contact Staring  Facial Expression Animated  Affect Irritable  Speech Incoherent;Pressured;Rapid;Slurred;Word salad;Argumentative  Interaction Assertive;Demanding;Intrusive  Motor Activity Shuffling  Appearance/Hygiene Improved  Behavior Characteristics Resistant to care  Mood Labile;Suspicious  Aggressive Behavior  Effect No apparent injury  Thought Process  Coherency Disorganized  Content Religiosity  Delusions Religious  Perception Hallucinations  Hallucination Auditory;Visual  Judgment Poor  Confusion Moderate  Danger to Self  Current suicidal ideation? Denies  Danger to Others  Danger to Others None reported or observed   [place] Note: Patient continues with mood lability and resistant to care. Haldol IM given given. Safety checks maintained.

## 2021-12-15 NOTE — Progress Notes (Addendum)
[facility] MD Progress Note  12/15/2021 4:12 PM Paula Dickson  MRN:  [id number] Subjective:  Paula Dickson is a 56 yr old female who presents with bizarre behaviors and nonsensical speech after medication noncompliance.  PPHx is significant for Schizoaffective Disorder- Bipolar type complicated by medication noncompliance and previous suicide attempt (OD in 2018), multiple hospitalizations (latest 11/21 [facility]).   Case was discussed in the multidisciplinary team.  MAR was reviewed Patient refused her QHS PO medications and received IM. Patient also refused PO meds this AM. Patient has required PRNs (Ativan) for agitation.    Psychiatry team made following the recommendations yesterday: IVC first eval completed 12-02-21. IVC served to pt 12-02-21. 24 hour eval completed 12-02-21.  FORCED MED ORDER Started 1/5  - 1/12!!!! Second FORCED MED ORDER 1/13-1/20!!! -Increase Haldol 5 mg TID or 5 mg IM if patient refuses p.o. - Zyprexa 5mg  PO qam. We will not order IM zyprexa, as pt will already be receiving IM haldol if refusing PO haldol, and we want to avoid administering 2 different antipsychotics as IM at the same time, for refusal of PO medications.    Regular scheduled medications ( QHS doses have been moved to 1700 and 1800) -INCREASE Haldol to 5 mg TID (IM back-up if pt refuses) -Continue Restoril 30 mg QHS (discontinued IM Ativan if refused) -Continue Depakote ER 750mg   - CONTINUE Zyprexa 5 mg BID  (no IM back-up if pt refuses) -Continue  Amantadine 100mg  every other day, for EPS prophylaxis. -Continue Aricept 5mg  QHS for cognitive impairment, patient repsoding well, consider increase to BID in a 3-4 days EKG- QTC 437 (1/15) - Start Clozapine 12.5mg  qhs (pt refused dose last night 12-15-21). Will cross titrate clozapine and olanzapine, if pt starts to accept clozapine.   Per staff patient has been more delusional about staff and disorganized on the unit. Patient is becoming more resistant about her  medications and requiring IMs and PRNs. Patient is preoccupied with her medications. Patient is also becoming more irritable when staff attempt to help with something or explain something she has endorsed confusion about.   On assessment this AM patient is in her room and initially asks providers to come back when she is "ready" and has changed her clothes. Patient alerted providers when she was ready. Patient reported endorsed that she does not like the medications that are being given to her. Patient reports she does not believe she needs haldol because it was not a medication she required prior to this hospitalization. Patient endorses that she "slowing down" but is not ready to sleep and would rather read. Patient endorses that she'll read what ever page she flips to in the bible "because it will align with my life." Patient endorses that she is eating very well. Patient reports that she has had VH but is not having any currently and does not endorse AH. Patient denies HI and SI.    Principal Problem: Schizoaffective disorder, bipolar type ([place]) Diagnosis: Principal Problem:   Schizoaffective disorder, bipolar type ([place]) Active Problems:   Hyperthyroidism   Benign essential HTN   AKI (acute kidney injury) ([place])   Cognitive dysfunction in chronic schizophrenia ([place])  Total Time spent with patient: 20 minutes  Past Psychiatric History: See H&P  Past Medical History:  Past Medical History:  Diagnosis Date   Bipolar affective disorder ([place])    Bipolar disorder ([place])    History of arthritis    History of chicken pox    History of depression  History of genital warts    history of heart murmur    History of high blood pressure    History of thyroid disease    History of UTI    Hypertension    Low TSH level 07/13/2017   Schizophrenia ([place])     Past Surgical History:  Procedure Laterality Date   ABLATION ON ENDOMETRIOSIS     CYST REMOVAL NECK     around 11 years ago /benign    MULTIPLE TOOTH EXTRACTIONS     Family History:  Family History  Problem Relation Age of Onset   Arthritis Father    Hyperlipidemia Father    High blood pressure Father    Diabetes Sister    Diabetes Mother    Diabetes Brother    Mental illness Brother    Alcohol abuse Paternal Uncle    Alcohol abuse Paternal Grandfather    Breast cancer Maternal Aunt    Breast cancer Paternal Aunt    High blood pressure Sister    Mental illness Other        runs in family   Family Psychiatric  History:  See H&P Social History:  Social History   Substance and Sexual Activity  Alcohol Use Not Currently     Social History   Substance and Sexual Activity  Drug Use Not Currently    Social History   Socioeconomic History   Marital status: Single    Spouse name: Not on file   Number of children: Not on file   Years of education: Not on file   Highest education level: Patient refused  Occupational History   Not on file  Tobacco Use   Smoking status: Never   Smokeless tobacco: Never  Vaping Use   Vaping Use: Never used  Substance and Sexual Activity   Alcohol use: Not Currently   Drug use: Not Currently   Sexual activity: Not Currently  Other Topics Concern   Not on file  Social History Narrative   ** Merged History Encounter **       Social Determinants of Health   Financial Resource Strain: Not on file  Food Insecurity: Not on file  Transportation Needs: Not on file  Physical Activity: Not on file  Stress: Not on file  Social Connections: Not on file   Additional Social History:                         Sleep: Poor  Appetite:  Good  Current Medications: Current Facility-Administered Medications  Medication Dose Route Frequency Provider Last Rate Last Admin   acetaminophen (TYLENOL) tablet 650 mg  650 mg Oral Q6H PRN Ethelene Hal, NP   650 mg at 11/27/21 2347   alum & mag hydroxide-simeth (MAALOX/MYLANTA) 200-200-20 MG/5ML suspension 30 mL  30 mL  Oral Q4H PRN Ethelene Hal, NP       benztropine (COGENTIN) tablet 1 mg  1 mg Oral Daily McQuilla, Jai B, MD       Or   benztropine mesylate (COGENTIN) injection 1 mg  1 mg Intramuscular Daily McQuilla, Jai B, MD       cloZAPine (CLOZARIL) tablet 12.5 mg  12.5 mg Oral Q supper Damita Dunnings B, MD       diclofenac Sodium (VOLTAREN) 1 % topical gel 2 g  2 g Topical QID Damita Dunnings B, MD   2 g at 12/13/21 0841   diphenhydrAMINE (BENADRYL) injection 50 mg  50 mg Intramuscular  Q6H PRN Janine Limbo, MD   50 mg at 12/13/21 2250   divalproex (DEPAKOTE ER) 24 hr tablet 750 mg  750 mg Oral Q supper Damita Dunnings B, MD       donepezil (ARICEPT) tablet 5 mg  5 mg Oral Q supper Damita Dunnings B, MD       haloperidol (HALDOL) tablet 5 mg  5 mg Oral Q6H PRN Hill, Jackie Plum, MD       And   LORazepam (ATIVAN) tablet 1 mg  1 mg Oral Q6H PRN Hill, Jackie Plum, MD       haloperidol (HALDOL) tablet 5 mg  5 mg Oral TID Damita Dunnings B, MD       Or   haloperidol lactate (HALDOL) injection 5 mg  5 mg Intramuscular TID Damita Dunnings B, MD   5 mg at 12/15/21 1306   haloperidol lactate (HALDOL) injection 5 mg  5 mg Intramuscular Q6H PRN Maida Sale, MD   5 mg at 12/11/21 2123   LORazepam (ATIVAN) injection 1 mg  1 mg Intramuscular Q6H PRN Harlow Asa, MD   1 mg at 12/14/21 1909   melatonin tablet 10 mg  10 mg Oral QHS Hill, Jackie Plum, MD       methimazole (TAPAZOLE) tablet 5 mg  5 mg Oral BID Sundai Probert, Ovid Curd, MD   5 mg at 12/15/21 1148   OLANZapine zydis (ZYPREXA) disintegrating tablet 5 mg  5 mg Oral BID Damita Dunnings B, MD       Or   OLANZapine (ZYPREXA) injection 5 mg  5 mg Intramuscular BID Damita Dunnings B, MD       pantoprazole (PROTONIX) EC tablet 20 mg  20 mg Oral Daily Rosezetta Schlatter, MD   20 mg at 12/15/21 1147   polyethylene glycol (MIRALAX / GLYCOLAX) packet 17 g  17 g Oral Daily Damita Dunnings B, MD   17 g at 12/15/21 1062   propranolol ER (INDERAL LA) 24 hr  capsule 60 mg  60 mg Oral Daily Sidra Oldfield, Ovid Curd, MD   60 mg at 12/15/21 1147   temazepam (RESTORIL) capsule 30 mg  30 mg Oral QHS Freida Busman, MD       white petrolatum (VASELINE) gel   Topical PRN Rosezetta Schlatter, MD   Given at 12/04/21 1516    Lab Results:  Results for orders placed or performed during the hospital encounter of 11/23/21 (from the past 48 hour(s))  Basic metabolic panel     Status: Abnormal   Collection Time: 12/14/21  6:33 AM  Result Value Ref Range   Sodium 140 135 - 145 mmol/L   Potassium 4.5 3.5 - 5.1 mmol/L   Chloride 105 98 - 111 mmol/L   CO2 28 22 - 32 mmol/L   Glucose, Bld 133 (H) 70 - 99 mg/dL    Comment: Glucose reference range applies only to samples taken after fasting for at least 8 hours.   BUN 24 (H) 6 - 20 mg/dL   Creatinine, Ser 1.33 (H) 0.44 - 1.00 mg/dL   Calcium 9.9 8.9 - 10.3 mg/dL   GFR, Estimated 47 (L) >60 mL/min    Comment: (NOTE) Calculated using the CKD-EPI Creatinine Equation (2021)    Anion gap 7 5 - 15    Comment: Performed at [facility], [place] [address] [place],  [address]  C-reactive protein     Status: None   Collection Time: 12/14/21  6:33 AM  Result Value Ref  Range   CRP 0.5 <1.0 mg/dL    Comment: Performed at [facility] [address], [place], [place] [address]  Troponin I (High Sensitivity)     Status: None   Collection Time: 12/14/21  6:33 AM  Result Value Ref Range   Troponin I (High Sensitivity) 2 <18 ng/L    Comment: (NOTE) Elevated high sensitivity troponin I (hsTnI) values and significant  changes across serial measurements may suggest ACS but many other  chronic and acute conditions are known to elevate hsTnI results.  Refer to the Links section for chest pain algorithms and additional  guidance. Performed at [facility], [place] [address] [place],  [address]     Blood Alcohol level:  Lab Results  Component Value Date   [facility] <10 11/22/2021    ETH <10 77/82/4235    Metabolic Disorder Labs: Lab Results  Component Value Date   HGBA1C 6.1 (H) 11/24/2021   MPG 128.37 11/24/2021   MPG 119.76 03/28/2021   Lab Results  Component Value Date   PROLACTIN 11.0 03/14/2019   Lab Results  Component Value Date   CHOL 127 11/24/2021   TRIG 161 (H) 11/24/2021   HDL 39 (L) 11/24/2021   CHOLHDL 3.3 11/24/2021   VLDL 32 11/24/2021   LDLCALC 56 11/24/2021   LDLCALC 95 10/26/2020    Physical Findings: AIMS: Facial and Oral Movements Muscles of Facial Expression: None, normal Lips and Perioral Area: None, normal Jaw: None, normal Tongue: None, normal,Extremity Movements Upper (arms, wrists, hands, fingers): Mild Lower (legs, knees, ankles, toes): None, normal, Trunk Movements Neck, shoulders, hips: None, normal, Overall Severity Severity of abnormal movements (highest score from questions above): None, normal Incapacitation due to abnormal movements: None, normal Patient's awareness of abnormal movements (rate only patient's report): No Awareness, Dental Status Current problems with teeth and/or dentures?: No Does patient usually wear dentures?: No  CIWA:    COWS:     Musculoskeletal: Strength & Muscle Tone: within normal limits Gait & Station: normal Patient leans: N/A  Psychiatric Specialty Exam:  Presentation  General Appearance: Casual (in psych scrubs)  Eye Contact:Fleeting  Speech:Clear and Coherent; Pressured  Speech Volume:Normal  Handedness:Right   Mood and Affect  Mood:Irritable; Labile  Affect:Congruent   Thought Process  Thought Processes:Disorganized  Descriptions of Associations:Tangential  Orientation:Full (Time, Place and Person)  Thought Content:Illogical; Tangential; Delusions  History of Schizophrenia/Schizoaffective disorder:Yes  Duration of Psychotic Symptoms:Greater than six months  Hallucinations:Hallucinations: None  Ideas of Reference:Delusions  Suicidal  Thoughts:Suicidal Thoughts: No  Homicidal Thoughts:Homicidal Thoughts: No   Sensorium  Memory:Immediate Poor; Recent Poor; Remote Poor  Judgment:Impaired  Insight:Shallow   Executive Functions  Concentration:Poor  Attention Span:Poor  Recall:Poor  Fund of Knowledge:Poor  Language:Poor   Psychomotor Activity  Psychomotor Activity: Tremor in LUE  Assets  Assets:Resilience   Sleep  Sleep:Sleep: Poor    Physical Exam: Physical Exam HENT:     Head: Normocephalic and atraumatic.  Pulmonary:     Effort: Pulmonary effort is normal.  Neurological:     Mental Status: She is alert.   Review of Systems  Psychiatric/Behavioral:  Negative for hallucinations and suicidal ideas. The patient has insomnia.   Blood pressure (!) 146/96, pulse 88, temperature (!) 97.3 F (36.3 C), temperature source Oral, resp. rate 16, height 5' (1.524 m), weight 76.7 kg, SpO2 100 %. Body mass index is 33.01 kg/m.   Treatment Plan Summary: Daily contact with patient to assess and evaluate symptoms and progress in  treatment and Medication management Paula Dickson is a 56 yo patient w/ PPH of schizophrenia. Patient continues to appear disorganized in her thoughts and speech. Patient has poor attention and concentration. Patient is not being compliant with her medications and each day is becoming more difficult to redirect on the unit. Patient will require additional IM antipsychotics as she is now not taking any of her antipsychotics PO. Patient's tremor also appears be more noticeable. Despite attempts to help patient feel more comfortable with her medication she has only become more non compliant.   Schizoaffective Disorder- Bipolar type: IVC first eval completed 12-02-21. IVC served to pt 12-02-21. 24 hour eval completed 12-02-21.   FORCED MED ORDER Started 1/5  - 1/12!!!! Second FORCED MED ORDER 1/13-1/20!!! -Haldol 5 mg TID  or 5 mg IM if patient refuses p.o. - Zyprexa 5mg  BID PO or IM if patient  refuses MUST BE 1+ HR from Ativan or Haldol administration  - Start Cogentin 1mg  PO daily or IM if patient refuses   -Continue Haldol 5 mg TID (IM back-up if pt refuses) -Continue Restoril 30 mg QHS  - Continue Ativan 1mg  q6h PRN -Continue Depakote ER 750mg   -(1/9) VA level: 117, CBC-WNL, hepatic function panel-AST 12 , follow-up repeat Depakote level (14) , CBC (WNL), and CMP - Continue Zyprexa 5 mg BID or IM if patient refuses PO - Discontinue  Amantadine 100mg  every other day, for EPS prophylaxis. Pt's tremor has worsened since reducing amantadine and she is no longer consistent with PO meds -Continue Aricept 5mg  QHS for cognitive impairment, patient repsoding well, consider increase to BID in a 3-4 days EKG- QTC 437 (1/15) - Start Clozapine 12.5mg  qhs (pt refused dose last night 12-13-21). Will cross titrate clozapine and olanzapine, if pt starts to accept clozapine.    Expected schedule for Clozaril titration:  Day 1: Start Clozaril 12.5mg  QHS, Decrease Zyprexa to 5mg  BID Day 2: Clozaril 12.5mg  BID, Zyprexa 5mg  BID Day 3: Clozaril 12.5 mg daily and 25mg  QHS, Zyprexa 2.5mg  daily and 5mg  QHS Day 4: Clozaril 25mg  daily and 37.5mg  QHS, Zyprexa 2.5mg  QHS Day 5: Clozaril 37.5mg  BID  REMS: [id number]   HTN   Tachycardia: -Continue Propanolol ER 60 mg once daily  -Continue to hold Losartan 25 mg daily - Creatinine 1.42> 1.60>1.29>1.29>1.52   AKI- improving, stable: Will push PO fluids -Encourage PO fluid intake: Gatorade and H2O   Hyperthyroidism: Spoke with hospitalist (1/3)  -Continue methimazole at 5 mg bid  -TSH (12/29)- 6.847>>> 12.328 (1/3)>>>6.397 (12-02-21) -Free T4 0.84 wnl (12-02-21)  -Follow up with PCP for further management   Constipation - Continue Miralax daily - KUB, noted distended bladder and moderate stool burden - Continue Aricept   Other PRNs:  -Continue Protonix 20 mg daily -Continue PRN's: Tylenol, Maalox, Atarax, Milk of Magnesia, Trazodone   Toe pain -  Volatren gel QID     PGY-2 Freida Busman, MD 12/15/2021, 4:12 PM  Total Time Spent in Direct Patient Care:  I personally spent 30 minutes on the unit in direct patient care. The direct patient care time included face-to-face time with the patient, reviewing the patient's chart, communicating with other professionals, and coordinating care. Greater than 50% of this time was spent in counseling or coordinating care with the patient regarding goals of hospitalization, psycho-education, and discharge planning needs.  I have independently evaluated the patient during a face-to-face assessment on 12/15/21. I reviewed the patient's chart, and I participated in key portions of the service. I discussed the  case with the [employer], and I agree with the assessment and plan of care as documented in the [employer] note, as addended by me or notated below:  I directly edited the note, as above.   Janine Limbo, MD Psychiatrist

## 2021-12-15 NOTE — B[facility] Group Notes (Signed)
Adult Psychoeducational Group Note  Date:  12/15/2021 Time:  10:46 AM  Group Topic/Focus:  Goals Group:   The focus of this group is to help patients establish daily goals to achieve during treatment and discuss how the patient can incorporate goal setting into their daily lives to aide in recovery.  Participation Level:  Active  Participation Quality:  Intrusive  Affect:  Irritable  Cognitive:  Disorganized  Insight: Lacking  Engagement in Group:  Lacking  Modes of Intervention:  Clarification    Paula Dickson 12/15/2021, 10:46 AM

## 2021-12-15 NOTE — Group Note (Signed)
Recreation Therapy Group Note   Group Topic:Health and Wellness  Group Date: 12/15/2021 Start Time: 1000 End Time: 1035 Facilitators: [name], LRT,CTRS Location: [address] Dayroom   Goal Area(s) Addresses:  Patient will verbalize benefit of exercise during group session. Patient will verbalize an exercise that can be completed in their hospital room during admission. Patient will identify an exercise that can be completed post d/c.  Group Description:  Exercise.  LRT and patients discussed the importance of exercise and benefits.  LRT then led group in a series of stretches to loosen up.  Each patient will take turns leading the group in some sort of movement of their choice.  The exercise will last at least 30 minutes.  Patients were encouraged to take breaks and get water as needed.   Affect/Mood: Appropriate   Participation Level: Engaged   Participation Quality: Minimal Cues   Behavior: Attentive  and Cooperative   Speech/Thought Process: Disorganized and Pressured   Insight: Fair   Judgement: Fair    Modes of Intervention: Music   Patient Response to Interventions:  Engaged   Education Outcome:  Acknowledges education and In group clarification offered    Clinical Observations/Individualized Feedback: Pt was attentive and participated the entire group.  Pt completed all exercises/movements while seated with minimal prompting.  Pt was still randomly talking on and off about different topics.  Pt was bright, smiling and seemed to be enjoying herself.   Plan: Continue to engage patient in RT group sessions 2-3x/week.   [name], LRT,CTRS 12/15/2021 11:10 AM

## 2021-12-15 NOTE — Progress Notes (Signed)
Pt continues to be disorganized, tangential with flight of ideas along with loose associations. Pt continues to get upset if you do not listen to her and if she ask a question and you answer the question and it is not the answer she wants to hear she gets upset and does not want to listen. Pt continues to question the accuracy of her medications , but can not comprehend when they are explained to her.    12/15/21 2300  Psych Admission Type (Psych Patients Only)  Admission Status Involuntary  Psychosocial Assessment  Patient Complaints Suspiciousness;Irritability  Eye Contact Staring  Facial Expression Animated  Affect Irritable  Speech Incoherent;Pressured;Rapid;Slurred;Word salad;Argumentative  Interaction Assertive;Demanding;Intrusive  Motor Activity Shuffling  Appearance/Hygiene Improved  Behavior Characteristics Anxious;Agitated  Mood Labile;Suspicious  Aggressive Behavior  Effect No apparent injury  Thought Process  Coherency Disorganized  Content Religiosity  Delusions Religious  Perception Hallucinations  Hallucination Auditory;Visual  Judgment Poor  Confusion Moderate  Danger to Self  Current suicidal ideation? Denies  Danger to Others  Danger to Others None reported or observed

## 2021-12-16 ENCOUNTER — Encounter (HOSPITAL_COMMUNITY): Payer: Self-pay

## 2021-12-16 DIAGNOSIS — R462 Strange and inexplicable behavior: Secondary | ICD-10-CM

## 2021-12-16 DIAGNOSIS — Z9114 Patient's other noncompliance with medication regimen: Secondary | ICD-10-CM

## 2021-12-16 NOTE — B[facility] Group Notes (Signed)
[facility] Group Notes:  (Nursing/MHT/Case Management/Adjunct)  Date:  12/16/2021  Time:  9:57 AM  Type of Therapy:   Orientation/Goals group  Participation Level:  Active  Participation Quality:  Appropriate  Affect:  Not Congruent  Cognitive:  Disorganized and Confused  Insight:  Lacking  Engagement in Group:  Distracting and Lacking  Modes of Intervention:  Discussion, Education, and Orientation  Summary of Progress/Problems: Pt goal for today is to have a good day so that she can be able to go home.   Paula Dickson 12/16/2021, 9:57 AM

## 2021-12-16 NOTE — Progress Notes (Signed)
Pt continues to appear to have her thoughts all running together. Pt does remember information , but pt can not separate the information that she received 5 years ago from the information she got 5 minutes ago at times, and it just appears to run together in her head , based off of her conversations this evening

## 2021-12-16 NOTE — Progress Notes (Signed)
[facility] MD Progress Note  12/16/2021 12:15 PM Paula Dickson  MRN:  [id number] Subjective:   Tine is a 56 yr old female who presents with bizarre behaviors and nonsensical speech after medication noncompliance.  PPHx is significant for Schizoaffective Disorder- Bipolar type complicated by medication noncompliance and previous suicide attempt (OD in 2018), multiple hospitalizations (latest 11/21 [facility]).   Case was discussed in the multidisciplinary team.  MAR was reviewed Patient refused her QHS PO medications and did not receive IM. Patient also refused PO meds this AM and did receive IM. Patient  is apparently willing to get I when it has to be given.   Psychiatry team made following the recommendations yesterday: IVC first eval completed 12-02-21. IVC served to pt 12-02-21. 24 hour eval completed 12-02-21.   FORCED MED ORDER Started 1/5  - 1/12!!!! Second FORCED MED ORDER 1/13-1/20!!! -Haldol 5 mg TID  or 5 mg IM if patient refuses p.o. - Zyprexa 5mg  BID PO or IM if patient refuses MUST BE 1+ HR from Ativan or Haldol administration   - Start Cogentin 1mg  PO daily or IM if patient refuses   -Continue Haldol 5 mg TID (IM back-up if pt refuses) -Continue Restoril 30 mg QHS  - Continue Ativan 1mg  q6h PRN -Continue Depakote ER 750mg   -(1/9) VA level: 117, CBC-WNL, hepatic function panel-AST 12 , follow-up repeat Depakote level (14) , CBC (WNL), and CMP - Continue Zyprexa 5 mg BID or IM if patient refuses PO - Discontinue  Amantadine 100mg  every other day, for EPS prophylaxis. Pt's tremor has worsened since reducing amantadine and she is no longer consistent with PO meds -Continue Aricept 5mg  QHS for cognitive impairment, patient repsoding well, consider increase to BID in a 3-4 days EKG- QTC 437 (1/15) - Start Clozapine 12.5mg  qhs (pt refused dose last night 12-13-21). Will cross titrate clozapine and olanzapine, if pt starts to accept clozapine.   On assessment patient reports that she slept  very well and is eating well. Patient reports that she does not agree with her medications and she does not like getting the IM forms. Patient endorses that if Haldol was not offered she would take all of her PO meds. Patient reports that she "does not take Haldol."  Patient denies SI, HI and AVH. Patient reports that she talks to God but endorses that these are not clear conversations like we have with each other. Patient reports that she would like to leave assessment to get ready for lunch.   Principal Problem: Schizoaffective disorder, bipolar type ([place]) Diagnosis: Principal Problem:   Schizoaffective disorder, bipolar type ([place]) Active Problems:   Hyperthyroidism   Benign essential HTN   AKI (acute kidney injury) ([place])   Cognitive dysfunction in chronic schizophrenia ([place])  Total Time spent with patient: 15 minutes  Past Psychiatric History: See H&P  Past Medical History:  Past Medical History:  Diagnosis Date   Bipolar affective disorder ([place])    Bipolar disorder ([place])    History of arthritis    History of chicken pox    History of depression    History of genital warts    history of heart murmur    History of high blood pressure    History of thyroid disease    History of UTI    Hypertension    Low TSH level 07/13/2017   Schizophrenia ([place])     Past Surgical History:  Procedure Laterality Date   ABLATION ON ENDOMETRIOSIS     CYST REMOVAL  NECK     around 11 years ago /benign   MULTIPLE TOOTH EXTRACTIONS     Family History:  Family History  Problem Relation Age of Onset   Arthritis Father    Hyperlipidemia Father    High blood pressure Father    Diabetes Sister    Diabetes Mother    Diabetes Brother    Mental illness Brother    Alcohol abuse Paternal Uncle    Alcohol abuse Paternal Grandfather    Breast cancer Maternal Aunt    Breast cancer Paternal Aunt    High blood pressure Sister    Mental illness Other        runs in family    Family Psychiatric  History: See H&P Social History:  Social History   Substance and Sexual Activity  Alcohol Use Not Currently     Social History   Substance and Sexual Activity  Drug Use Not Currently    Social History   Socioeconomic History   Marital status: Single    Spouse name: Not on file   Number of children: Not on file   Years of education: Not on file   Highest education level: Patient refused  Occupational History   Not on file  Tobacco Use   Smoking status: Never   Smokeless tobacco: Never  Vaping Use   Vaping Use: Never used  Substance and Sexual Activity   Alcohol use: Not Currently   Drug use: Not Currently   Sexual activity: Not Currently  Other Topics Concern   Not on file  Social History Narrative   ** Merged History Encounter **       Social Determinants of Health   Financial Resource Strain: Not on file  Food Insecurity: Not on file  Transportation Needs: Not on file  Physical Activity: Not on file  Stress: Not on file  Social Connections: Not on file   Additional Social History:                         Sleep: Good  Appetite:  Good  Current Medications: Current Facility-Administered Medications  Medication Dose Route Frequency Provider Last Rate Last Admin   acetaminophen (TYLENOL) tablet 650 mg  650 mg Oral Q6H PRN Ethelene Hal, NP   650 mg at 11/27/21 2347   alum & mag hydroxide-simeth (MAALOX/MYLANTA) 200-200-20 MG/5ML suspension 30 mL  30 mL Oral Q4H PRN Ethelene Hal, NP       benztropine (COGENTIN) tablet 1 mg  1 mg Oral Daily Aditri Louischarles B, MD       Or   benztropine mesylate (COGENTIN) injection 1 mg  1 mg Intramuscular Daily Christoph Copelan, Joyce Gross B, MD   1 mg at 12/16/21 0834   cloZAPine (CLOZARIL) tablet 12.5 mg  12.5 mg Oral Q supper Damita Dunnings B, MD       diclofenac Sodium (VOLTAREN) 1 % topical gel 2 g  2 g Topical QID Damita Dunnings B, MD   2 g at 12/13/21 0841    diphenhydrAMINE (BENADRYL) injection 50 mg  50 mg Intramuscular Q6H PRN Massengill, Ovid Curd, MD   50 mg at 12/13/21 2250   divalproex (DEPAKOTE ER) 24 hr tablet 750 mg  750 mg Oral Q supper Damita Dunnings B, MD       donepezil (ARICEPT) tablet 5 mg  5 mg Oral Q supper Damita Dunnings B, MD       haloperidol (HALDOL) tablet 5 mg  5 mg  Oral Q6H PRN Maida Sale, MD       And   LORazepam (ATIVAN) tablet 1 mg  1 mg Oral Q6H PRN Hill, Jackie Plum, MD       haloperidol (HALDOL) tablet 5 mg  5 mg Oral TID Damita Dunnings B, MD   5 mg at 12/15/21 1811   Or   haloperidol lactate (HALDOL) injection 5 mg  5 mg Intramuscular TID Damita Dunnings B, MD   5 mg at 12/16/21 0834   haloperidol lactate (HALDOL) injection 5 mg  5 mg Intramuscular Q6H PRN Maida Sale, MD   5 mg at 12/11/21 2123   LORazepam (ATIVAN) injection 1 mg  1 mg Intramuscular Q6H PRN Harlow Asa, MD   1 mg at 12/14/21 1909   melatonin tablet 10 mg  10 mg Oral QHS Hill, Jackie Plum, MD   10 mg at 12/15/21 2042   methimazole (TAPAZOLE) tablet 5 mg  5 mg Oral BID Massengill, Ovid Curd, MD   5 mg at 12/15/21 1148   OLANZapine zydis (ZYPREXA) disintegrating tablet 5 mg  5 mg Oral BID Damita Dunnings B, MD       Or   OLANZapine (ZYPREXA) injection 5 mg  5 mg Intramuscular BID Damita Dunnings B, MD   5 mg at 12/16/21 1016   pantoprazole (PROTONIX) EC tablet 20 mg  20 mg Oral Daily Rosezetta Schlatter, MD   20 mg at 12/15/21 1147   polyethylene glycol (MIRALAX / GLYCOLAX) packet 17 g  17 g Oral Daily Damita Dunnings B, MD   17 g at 12/15/21 0925   propranolol ER (INDERAL LA) 24 hr capsule 60 mg  60 mg Oral Daily Massengill, Ovid Curd, MD   60 mg at 12/15/21 1147   temazepam (RESTORIL) capsule 30 mg  30 mg Oral QHS Damita Dunnings B, MD   30 mg at 12/15/21 2041   white petrolatum (VASELINE) gel   Topical PRN Rosezetta Schlatter, MD   Given at 12/04/21 1516    Lab Results: No results found for this or any previous visit (from the  past 48 hour(s)).  Blood Alcohol level:  Lab Results  Component Value Date   ETH <10 11/22/2021   ETH <10 94/80/1655    Metabolic Disorder Labs: Lab Results  Component Value Date   HGBA1C 6.1 (H) 11/24/2021   MPG 128.37 11/24/2021   MPG 119.76 03/28/2021   Lab Results  Component Value Date   PROLACTIN 11.0 03/14/2019   Lab Results  Component Value Date   CHOL 127 11/24/2021   TRIG 161 (H) 11/24/2021   HDL 39 (L) 11/24/2021   CHOLHDL 3.3 11/24/2021   VLDL 32 11/24/2021   LDLCALC 56 11/24/2021   LDLCALC 95 10/26/2020    Physical Findings: AIMS: Facial and Oral Movements Muscles of Facial Expression: None, normal Lips and Perioral Area: None, normal Jaw: None, normal Tongue: None, normal,Extremity Movements Upper (arms, wrists, hands, fingers): Mild Lower (legs, knees, ankles, toes): None, normal, Trunk Movements Neck, shoulders, hips: None, normal, Overall Severity Severity of abnormal movements (highest score from questions above): None, normal Incapacitation due to abnormal movements: None, normal Patient's awareness of abnormal movements (rate only patient's report): No Awareness, Dental Status Current problems with teeth and/or dentures?: No Does patient usually wear dentures?: No  CIWA:    COWS:     Musculoskeletal: Strength & Muscle Tone: within normal limits Gait & Station: normal Patient leans: N/A  Psychiatric Specialty Exam:  Presentation  General Appearance: Casual (  wearing 3 layers of shirts, but happy to have clean clothes)  Eye Contact:Fleeting  Speech:Clear and Coherent; Pressured  Speech Volume:Normal  Handedness:Right   Mood and Affect  Mood:Dysphoric; Irritable  Affect:Congruent   Thought Process  Thought Processes:Disorganized  Descriptions of Associations:Circumstantial  Orientation:Full (Time, Place and Person)  Thought Content:Tangential  History of Schizophrenia/Schizoaffective disorder:Yes  Duration of Psychotic  Symptoms:Greater than six months  Hallucinations:Hallucinations: None  Ideas of Reference:Delusions  Suicidal Thoughts:Suicidal Thoughts: No  Homicidal Thoughts:Homicidal Thoughts: No   Sensorium  Memory:Immediate Fair; Recent Poor; Remote Fair  Judgment:Impaired  Insight:Shallow   Executive Functions  Concentration:Poor  Attention Span:Poor  [place]  Language:Poor   Psychomotor Activity  Psychomotor Activity:Psychomotor Activity: Tremor   Assets  Assets:Resilience   Sleep  Sleep:Sleep: Good    Physical Exam: Physical Exam HENT:     Head: Normocephalic and atraumatic.  Pulmonary:     Effort: Pulmonary effort is normal.     Breath sounds: Normal breath sounds.  Neurological:     Mental Status: She is alert.   Review of Systems  Psychiatric/Behavioral:  Negative for hallucinations and suicidal ideas. The patient does not have insomnia.   Blood pressure 127/86, pulse 87, temperature 98.2 F (36.8 C), temperature source Oral, resp. rate 18, height 5' (1.524 m), weight 76.7 kg, SpO2 99 %. Body mass index is 33.01 kg/m.   Treatment Plan Summary: Daily contact with patient to assess and evaluate symptoms and progress in treatment and Medication management Envi is a 56 yo patient w/ PPH of schizophrenia. Patient was able to sleep last night despite not receiving her her PM medications. Patient continues to be disorganized, irritable, and labile. Patient needs her Forced Med Order. Patient only becomes more non compliant each day. Interestingly patient was more aware of her tremor today, in the past she has not acknowledged this. There was a very minimal level of insight regarding this and her hospitalization.Will recommend that RN staff attempt to give other PO meds before giving the Haldol as this appears to be a trigger for patient to refuse all of her meds.   Schizoaffective Disorder- Bipolar type: IVC first eval completed  12-02-21. IVC served to pt 12-02-21. 24 hour eval completed 12-02-21.   FORCED MED ORDER Started 1/5  - 1/12!!!! Second FORCED MED ORDER 1/13-1/20!!! THRID FORCED MED ORDER 1/20- 1/27!!!!! -Haldol 5 mg TID  or 5 mg IM if patient refuses p.o. - Zyprexa 5mg  BID PO or IM if patient refuses MUST BE 1+ HR from Ativan or Haldol administration   - Continue Cogentin 1mg  PO daily or IM if patient refuses   -Continue Haldol 5 mg TID (IM back-up if pt refuses) -Continue Restoril 30 mg QHS  - Continue Ativan 1mg  q6h PRN -Continue Depakote ER 750mg   -(1/9) VA level: 117, CBC-WNL, hepatic function panel-AST 12 , follow-up repeat Depakote level (14) , CBC (WNL), and CMP - Continue Zyprexa 5 mg BID or IM if patient refuses PO -Continue Aricept 5mg  QHS for cognitive impairment, patient repsoding well, consider increase to BID in a 3-4 days EKG- QTC 437 (1/15) - Start Clozapine 12.5mg  qhs (pt refused dose last night 12-13-21). Will cross titrate clozapine and olanzapine, if pt starts to accept clozapine.    Expected schedule for Clozaril titration:  Day 1: Start Clozaril 12.5mg  QHS, Decrease Zyprexa to 5mg  BID Day 2: Clozaril 12.5mg  BID, Zyprexa 5mg  BID Day 3: Clozaril 12.5 mg daily and 25mg  QHS, Zyprexa 2.5mg  daily and 5mg  QHS  Day 4: Clozaril 25mg  daily and 37.5mg  QHS, Zyprexa 2.5mg  QHS Day 5: Clozaril 37.5mg  BID  REMS: [id number]   HTN   Tachycardia: -Continue Propanolol ER 60 mg once daily  -Continue to hold Losartan 25 mg daily - Creatinine 1.42> 1.60>1.29>1.29>1.52   AKI- improving, stable: Will push PO fluids -Encourage PO fluid intake: Gatorade and H2O   Hyperthyroidism: Spoke with hospitalist (1/3)  -Continue methimazole at 5 mg bid  -TSH (12/29)- 6.847>>> 12.328 (1/3)>>>6.397 (12-02-21) -Free T4 0.84 wnl (12-02-21)  -Follow up with PCP for further management   Constipation - Continue Miralax daily - KUB, noted distended bladder and moderate stool burden - Continue Aricept   Other PRNs:   -Continue Protonix 20 mg daily -Continue PRN's: Tylenol, Maalox, Atarax, Milk of Magnesia, Trazodone   Toe pain - Volatren gel QID       PGY-2 Freida Busman, MD 12/16/2021, 12:15 PM

## 2021-12-16 NOTE — Progress Notes (Signed)
Pt continues to refuse to take medications because "I don't take those at home, leave me alone."  IM antipsychotics given.

## 2021-12-16 NOTE — Progress Notes (Signed)
°   12/16/21 0555  Sleep  Number of Hours 5

## 2021-12-16 NOTE — Group Note (Signed)
Recreation Therapy Group Note   Group Topic:Other  Group Date: 12/16/2021 Start Time: 1010 End Time: 1040 Facilitators: [name], LRT,CTRS Location: [address] Dayroom   Goal Area(s) Addresses:  Patient will appropriately identify why self expression is important. Patient will create a shield of armor describing what makes them who they are. Patient will successfully identify positive attributes about themselves.    Group Description: Self-Expression Shield. Patient attended a recreation therapy group session focused on self expression.  LRT gave each patient a blank drawing of a shield.  Patients was asked to design the shield in a way to show off what is important to them.  The  four quadrants reflected the following:   The Upper Left quadrant- two things or people they value. The Upper Right quadrant- two lessons learned thus far in life. The Lower Left quadrant- three characteristics that make you unique. The Lower Right quadrant- what is a goal you are working towards.   Patients were provided sheets with the shield printed on them and colored pencils, markers and crayons to complete the activity.  Patients and writer had group related discussions while individually working on their activity.  Patients were debriefed on the importance of healthy self expression     Affect/Mood: Appropriate and Irritable   Participation Level: None   Participation Quality: Moderate Cues   Behavior: Appropriate   Speech/Thought Process: Uninterested   Insight: N/A   Judgement: N/A   Modes of Intervention: Art   Patient Response to Interventions:  Disengaged   Education Outcome:  Acknowledges education and In group clarification offered    Clinical Observations/Individualized Feedback: Pt was acting appropriately at the beginning of group.  Pt needed some redirection about constantly talking. Pt left and came back near the end of group seeming irritable.  Pt did not  participate.    Plan: Continue to engage patient in RT group sessions 2-3x/week.   [name], Glennis Brink  12/16/2021 12:53 PM

## 2021-12-16 NOTE — Progress Notes (Signed)
Dar Note: Patient continues to have mood lability.  Refused all morning medication after several encouragement.  Patient argumentative with staff relating to her medications.  Accusing staff of giving her the wrong medication.  IM Haldol, Cogentin and Zyprexa given per protocol.  Patient visible in milieu, disorganized with tangential and delusional thought process.  Routine safety checks maintained.  Patient is safe on the unit.

## 2021-12-16 NOTE — Progress Notes (Signed)
Adult Psychoeducational Group Note  Date:  12/16/2021 Time:  8:44 PM  Group Topic/Focus:  Wrap-Up Group:   The focus of this group is to help patients review their daily goal of treatment and discuss progress on daily workbooks.  Participation Level:  Active  Participation Quality:  Appropriate  Affect:  Appropriate  Cognitive:  Appropriate  Insight: Appropriate  Engagement in Group:  Engaged  Modes of Intervention:  Discussion  Additional Comments:  Pt stated her goal for today was to focus on her treatment plan. Pt stated she accomplished her goal today. Pt stated she did not get a chance to speak with a doctor or with a social worker about her care today. Pt rated her overall day a 6 out of 10. Pt stated she was able to contact her family friend today which improved her overall day. Pt stated she felt better about herself tonight. Pt stated she was able to attend all meals today. Pt stated she took all medications provided today. Pt stated her appetite was pretty good today. Pt rated her sleep last night was good. Pt stated the goal tonight was to get some rest. Pt stated she had no physical pain tonight. Pt admitted to dealing with visual hallucinations and auditory issues tonight. Pt nurse was updated on situation. Pt denies thoughts of harming herself or others. Pt stated she would alert staff if anything changed.  [name] 12/16/2021, 8:44 PM

## 2021-12-16 NOTE — Progress Notes (Signed)
[facility] Second Physician Opinion Progress Note for Medication Administration to Non-consenting Patients (For Involuntarily Committed Patients)  Patient: Paula Dickson Date of Birth: [id number] MRN: [id number]  Reason for the Medication: The patient, without the benefit of the specific treatment measure, is incapable of participating in any available treatment plan that will give the patient a realistic opportunity of improving the patient's condition. There is, without the benefit of the specific treatment measure, a significant possibility that the patient will harm self or others before improvement of the patient's condition is realized.  Consideration of Side Effects: Consideration of the side effects related to the medication plan has been given.  Rationale for Medication Administration:  I met with this patient in the dayroom. The patient cannot articulate an understanding of her diagnosis or need for hospitalization and cannot engage in a meaningful conversation about her medications. She is disorganized, rambling, and makes frequent illogical statements. She endorses AH of God and VH of seeing "prophecies." She has body language on exam suggestive of internal preoccupation.   Consultation for forced medication protocol was requested because the patient has intermittently been refusing po medications and appears to acutely have decompensated further as a result of missed medication doses. At this time I am in agreement that the patient needs to continue an antipsychotic in order to have a realistic expectation for improving her paranoia and managing her residual delusions/psychosis. It is my opinion that should the patient refuse medications that this would lead to more risk of potential self-harm than if she was on a protocol for medications against objection. Based on my overall assessment, I believe the benefits of administering medication to the patient significantly outweigh the risks.  The  patient would benefit from medication treatment which will be forced as she is incapable of participating advisable treatment plan at this time.   Harlow Asa, MD, FAPA 12/16/21  8:29 AM   This documentation is good for (7) seven days from the date of the MD signature. New documentation must be completed every seven (7) days with detailed justification in the medical record if the patient requires continued non-emergent administration of psychotropic medications.

## 2021-12-16 NOTE — BH IP Treatment Plan (Signed)
Interdisciplinary Treatment and Diagnostic Plan Update  12/16/2021 Time of Session: 9:45am  Paula Dickson MRN: [id number]  Principal Diagnosis: Schizoaffective disorder, bipolar type [facility])  Secondary Diagnoses: Principal Problem:   Schizoaffective disorder, bipolar type ([place]) Active Problems:   Hyperthyroidism   Benign essential HTN   AKI (acute kidney injury) ([place])   Cognitive dysfunction in chronic schizophrenia ([place])   Current Medications:  Current Facility-Administered Medications  Medication Dose Route Frequency Provider Last Rate Last Admin   acetaminophen (TYLENOL) tablet 650 mg  650 mg Oral Q6H PRN Ethelene Hal, NP   650 mg at 11/27/21 2347   alum & mag hydroxide-simeth (MAALOX/MYLANTA) 200-200-20 MG/5ML suspension 30 mL  30 mL Oral Q4H PRN Ethelene Hal, NP       benztropine (COGENTIN) tablet 1 mg  1 mg Oral Daily Damita Dunnings B, MD       Or   benztropine mesylate (COGENTIN) injection 1 mg  1 mg Intramuscular Daily Damita Dunnings B, MD   1 mg at 12/16/21 1601   cloZAPine (CLOZARIL) tablet 12.5 mg  12.5 mg Oral Q supper Damita Dunnings B, MD       diclofenac Sodium (VOLTAREN) 1 % topical gel 2 g  2 g Topical QID Damita Dunnings B, MD   2 g at 12/13/21 0841   diphenhydrAMINE (BENADRYL) injection 50 mg  50 mg Intramuscular Q6H PRN Massengill, Ovid Curd, MD   50 mg at 12/13/21 2250   divalproex (DEPAKOTE ER) 24 hr tablet 750 mg  750 mg Oral Q supper Damita Dunnings B, MD       donepezil (ARICEPT) tablet 5 mg  5 mg Oral Q supper Damita Dunnings B, MD       haloperidol (HALDOL) tablet 5 mg  5 mg Oral Q6H PRN Hill, Jackie Plum, MD       And   LORazepam (ATIVAN) tablet 1 mg  1 mg Oral Q6H PRN Hill, Jackie Plum, MD       haloperidol (HALDOL) tablet 5 mg  5 mg Oral TID Damita Dunnings B, MD   5 mg at 12/15/21 1811   Or   haloperidol lactate (HALDOL) injection 5 mg  5 mg Intramuscular TID Damita Dunnings B, MD   5 mg at 12/16/21 1319   haloperidol lactate (HALDOL)  injection 5 mg  5 mg Intramuscular Q6H PRN Maida Sale, MD   5 mg at 12/11/21 2123   LORazepam (ATIVAN) injection 1 mg  1 mg Intramuscular Q6H PRN Viann Fish E, MD   1 mg at 12/14/21 1909   melatonin tablet 10 mg  10 mg Oral QHS Hill, Jackie Plum, MD   10 mg at 12/15/21 2042   methimazole (TAPAZOLE) tablet 5 mg  5 mg Oral BID Massengill, Ovid Curd, MD   5 mg at 12/15/21 1148   OLANZapine zydis (ZYPREXA) disintegrating tablet 5 mg  5 mg Oral BID Damita Dunnings B, MD       Or   OLANZapine (ZYPREXA) injection 5 mg  5 mg Intramuscular BID Damita Dunnings B, MD   5 mg at 12/16/21 1006   pantoprazole (PROTONIX) EC tablet 20 mg  20 mg Oral Daily Rosezetta Schlatter, MD   20 mg at 12/15/21 1147   polyethylene glycol (MIRALAX / GLYCOLAX) packet 17 g  17 g Oral Daily Damita Dunnings B, MD   17 g at 12/15/21 0925   propranolol ER (INDERAL LA) 24 hr capsule 60 mg  60 mg Oral Daily Massengill, Ovid Curd, MD  60 mg at 12/15/21 1147   temazepam (RESTORIL) capsule 30 mg  30 mg Oral QHS Damita Dunnings B, MD   30 mg at 12/15/21 2041   white petrolatum (VASELINE) gel   Topical PRN Rosezetta Schlatter, MD   Given at 12/04/21 1516   PTA Medications: Medications Prior to Admission  Medication Sig Dispense Refill Last Dose   losartan (COZAAR) 25 MG tablet Take 1 tablet (25 mg total) by mouth daily. 30 tablet 0 Past Month   methimazole (TAPAZOLE) 10 MG tablet Take 1 tablet (10 mg total) by mouth 2 (two) times daily. 60 tablet 2 Past Month   OLANZapine (ZYPREXA) 10 MG tablet Take 10 mg by mouth at bedtime.   Past Month   pantoprazole (PROTONIX) 20 MG tablet Take 20 mg by mouth daily.   Past Month   propranolol (INDERAL) 20 MG tablet Take 1 tablet (20 mg total) by mouth 2 (two) times daily. (Patient taking differently: Take 10 mg by mouth 2 (two) times daily.) 60 tablet 0 Past Month   LORazepam (ATIVAN) 1 MG tablet Take 1 mg by mouth 3 (three) times daily.       Patient Stressors: Health problems   Medication change  or noncompliance    Patient Strengths: Motivation for treatment/growth  Supportive family/friends   Treatment Modalities: Medication Management, Group therapy, Case management,  1 to 1 session with clinician, Psychoeducation, Recreational therapy.   Physician Treatment Plan for Primary Diagnosis: Schizoaffective disorder, bipolar type ([place]) Long Term Goal(s): Improvement in symptoms so as ready for discharge   Short Term Goals: Ability to identify changes in lifestyle to reduce recurrence of condition will improve Ability to verbalize feelings will improve Ability to demonstrate self-control will improve Ability to identify and develop effective coping behaviors will improve Ability to maintain clinical measurements within normal limits will improve Compliance with prescribed medications will improve  Medication Management: Evaluate patient's response, side effects, and tolerance of medication regimen.  Therapeutic Interventions: 1 to 1 sessions, Unit Group sessions and Medication administration.  Evaluation of Outcomes: Progressing  Physician Treatment Plan for Secondary Diagnosis: Principal Problem:   Schizoaffective disorder, bipolar type ([place]) Active Problems:   Hyperthyroidism   Benign essential HTN   AKI (acute kidney injury) ([place])   Cognitive dysfunction in chronic schizophrenia ([place])  Long Term Goal(s): Improvement in symptoms so as ready for discharge   Short Term Goals: Ability to identify changes in lifestyle to reduce recurrence of condition will improve Ability to verbalize feelings will improve Ability to demonstrate self-control will improve Ability to identify and develop effective coping behaviors will improve Ability to maintain clinical measurements within normal limits will improve Compliance with prescribed medications will improve     Medication Management: Evaluate patient's response, side effects, and tolerance of medication regimen.  Therapeutic  Interventions: 1 to 1 sessions, Unit Group sessions and Medication administration.  Evaluation of Outcomes: Progressing   RN Treatment Plan for Primary Diagnosis: Schizoaffective disorder, bipolar type ([place]) Long Term Goal(s): Knowledge of disease and therapeutic regimen to maintain health will improve  Short Term Goals: Ability to remain free from injury will improve, Ability to participate in decision making will improve, Ability to verbalize feelings will improve, Ability to disclose and discuss suicidal ideas, and Ability to identify and develop effective coping behaviors will improve  Medication Management: RN will administer medications as ordered by provider, will assess and evaluate patient's response and provide education to patient for prescribed medication. RN will report any adverse and/or side  effects to prescribing provider.  Therapeutic Interventions: 1 on 1 counseling sessions, Psychoeducation, Medication administration, Evaluate responses to treatment, Monitor vital signs and CBGs as ordered, Perform/monitor CIWA, COWS, AIMS and Fall Risk screenings as ordered, Perform wound care treatments as ordered.  Evaluation of Outcomes: Progressing   LCSW Treatment Plan for Primary Diagnosis: Schizoaffective disorder, bipolar type ([place]) Long Term Goal(s): Safe transition to appropriate next level of care at discharge, Engage patient in therapeutic group addressing interpersonal concerns.  Short Term Goals: Engage patient in aftercare planning with referrals and resources, Increase social support, Increase emotional regulation, Facilitate acceptance of mental health diagnosis and concerns, Identify triggers associated with mental health/substance abuse issues, and Increase skills for wellness and recovery  Therapeutic Interventions: Assess for all discharge needs, 1 to 1 time with Social worker, Explore available resources and support systems, Assess for adequacy in community support  network, Educate family and significant other(s) on suicide prevention, Complete Psychosocial Assessment, Interpersonal group therapy.  Evaluation of Outcomes: Progressing   Progress in Treatment: Attending groups: Yes. Participating in groups: Yes. Taking medication as prescribed: Yes. Toleration medication: Yes. Family/Significant other contact made: Yes, individual(s) contacted:  Mother  Patient understands diagnosis: No. Discussing patient identified problems/goals with staff: Yes. Medical problems stabilized or resolved: Yes. Denies suicidal/homicidal ideation: Yes. Issues/concerns per patient self-inventory: No.   New problem(s) identified: No, Describe:  None   New Short Term/Long Term Goal(s): medication stabilization, elimination of SI thoughts, development of comprehensive mental wellness plan.   Patient Goals:  Declined to participate  Discharge Plan or Barriers: CSW made an ACTT referral for pt. An APS report has been made for pt.  Reason for Continuation of Hospitalization: Delusions  Hallucinations Mania Medication stabilization  Estimated Length of Stay: 3 to 5 days    Scribe for Treatment Team: Carney Harder 12/16/2021 3:53 PM

## 2021-12-16 NOTE — Progress Notes (Signed)
Pt continues to be labile and disorganized, with flight of ideas . Pt has fixed ideas of what her medications are and has a hard time deviating from them. Pt took PO Restoril and Melatonin per MAR without issue.     12/16/21 2100  Psych Admission Type (Psych Patients Only)  Admission Status Involuntary  Psychosocial Assessment  Patient Complaints Suspiciousness  Eye Contact Staring  Facial Expression Animated  Affect Irritable  Speech Incoherent;Pressured;Rapid;Slurred;Word salad;Argumentative  Interaction Assertive;Demanding;Intrusive  Motor Activity Shuffling  Appearance/Hygiene Improved  Behavior Characteristics Cooperative  Mood Labile;Suspicious;Preoccupied;Pleasant  Aggressive Behavior  Effect No apparent injury  Thought Process  Coherency Disorganized  Content Religiosity  Delusions Religious  Perception Hallucinations  Hallucination Auditory;Visual  Judgment Poor  Confusion Moderate  Danger to Self  Current suicidal ideation? Denies  Danger to Others  Danger to Others None reported or observed

## 2021-12-16 NOTE — Group Note (Signed)
LCSW Group Therapy Note  Group Date: 12/16/2021 Start Time: 1100 End Time: 1200   Type of Therapy and Topic:  Group Therapy: Positive Affirmations  Participation Level:  None   Description of Group:   This group addressed positive affirmation towards self and others.  Patients went around the room and identified two positive things about themselves and two positive things about a peer in the room.  Patients reflected on how it felt to share something positive with others, to identify positive things about themselves, and to hear positive things from others/ Patients were encouraged to have a daily reflection of positive characteristics or circumstances.   Therapeutic Goals: Patients will verbalize two of their positive qualities Patients will demonstrate empathy for others by stating two positive qualities about a peer in the group Patients will verbalize their feelings when voicing positive self affirmations and when voicing positive affirmations of others Patients will discuss the potential positive impact on their wellness/recovery of focusing on positive traits of self and others.  Summary of Patient Progress:  Pt did not attend  Therapeutic Modalities:   Cognitive [place], LCSW 12/16/2021  11:29 AM

## 2021-12-17 MED ORDER — HALOPERIDOL LACTATE 5 MG/ML IJ SOLN
10.0000 mg | Freq: Every day | INTRAMUSCULAR | Status: DC
  Administered 2021-12-19: 10 mg via INTRAMUSCULAR
  Filled 2021-12-17 (×7): qty 2

## 2021-12-17 MED ORDER — HALOPERIDOL LACTATE 5 MG/ML IJ SOLN
5.0000 mg | Freq: Every day | INTRAMUSCULAR | Status: DC
  Administered 2021-12-19: 5 mg via INTRAMUSCULAR
  Filled 2021-12-17 (×8): qty 1

## 2021-12-17 MED ORDER — BENZTROPINE MESYLATE 1 MG PO TABS
1.0000 mg | ORAL_TABLET | Freq: Two times a day (BID) | ORAL | Status: DC
  Administered 2021-12-17 – 2021-12-23 (×8): 1 mg via ORAL
  Filled 2021-12-17 (×11): qty 1
  Filled 2021-12-17: qty 14
  Filled 2021-12-17: qty 1
  Filled 2021-12-17: qty 14
  Filled 2021-12-17 (×2): qty 1

## 2021-12-17 MED ORDER — ZOLPIDEM TARTRATE 5 MG PO TABS
5.0000 mg | ORAL_TABLET | Freq: Every day | ORAL | Status: DC
  Administered 2021-12-17: 5 mg via ORAL
  Filled 2021-12-17: qty 1

## 2021-12-17 MED ORDER — HALOPERIDOL 5 MG PO TABS: 10.0000 mg | ORAL_TABLET | Freq: Every day | ORAL | Status: DC

## 2021-12-17 MED ORDER — BENZTROPINE MESYLATE 1 MG/ML IJ SOLN
1.0000 mg | Freq: Two times a day (BID) | INTRAMUSCULAR | Status: DC
  Administered 2021-12-19: 1 mg via INTRAMUSCULAR
  Filled 2021-12-17 (×16): qty 1

## 2021-12-17 MED ORDER — HALOPERIDOL 5 MG PO TABS
5.0000 mg | ORAL_TABLET | Freq: Every day | ORAL | Status: DC
  Administered 2021-12-18 – 2021-12-23 (×4): 5 mg via ORAL
  Filled 2021-12-17: qty 21
  Filled 2021-12-17 (×7): qty 1

## 2021-12-17 MED ORDER — AMANTADINE HCL 100 MG PO CAPS
100.0000 mg | ORAL_CAPSULE | Freq: Every day | ORAL | Status: DC
  Administered 2021-12-17 – 2021-12-23 (×4): 100 mg via ORAL
  Filled 2021-12-17 (×8): qty 1
  Filled 2021-12-17: qty 7

## 2021-12-17 MED ORDER — AMANTADINE HCL 100 MG PO CAPS: 100.0000 mg | ORAL_CAPSULE | Freq: Two times a day (BID) | ORAL | Status: DC

## 2021-12-17 MED ORDER — OLANZAPINE 10 MG PO TBDP
10.0000 mg | ORAL_TABLET | Freq: Every day | ORAL | Status: DC
  Administered 2021-12-18 – 2021-12-22 (×4): 10 mg via ORAL
  Filled 2021-12-17 (×8): qty 1

## 2021-12-17 MED ORDER — HALOPERIDOL LACTATE 5 MG/ML IJ SOLN: 10.0000 mg | Freq: Every day | INTRAMUSCULAR | Status: DC

## 2021-12-17 MED ORDER — OLANZAPINE 10 MG IM SOLR
10.0000 mg | Freq: Every day | INTRAMUSCULAR | Status: DC
  Filled 2021-12-17 (×7): qty 10

## 2021-12-17 MED ORDER — HALOPERIDOL 5 MG PO TABS
10.0000 mg | ORAL_TABLET | Freq: Every day | ORAL | Status: DC
  Administered 2021-12-18 – 2021-12-22 (×3): 10 mg via ORAL
  Filled 2021-12-17 (×7): qty 2

## 2021-12-17 NOTE — Progress Notes (Signed)
Pt was not allowed to attend afternoon psycho-ed group due to being manic and intrusive. Pt has been nothing but disturbance all day on the unit.

## 2021-12-17 NOTE — [facility] Group Notes (Signed)
[place] Group Notes:  (Nursing/MHT/Case Management/Adjunct)  Date:  12/17/2021  Time:  9:48 AM  Type of Therapy:   Orientation/Goals group  Participation Level:  Active  Participation Quality:  Appropriate  Affect:  Appropriate  Cognitive:  Disorganized and Confused  Insight:  Good and Improving  Engagement in Group:  Engaged and Improving  Modes of Intervention:  Discussion, Education, and Orientation  Summary of Progress/Problems: Pt goal for today is to not get any shots, nap and talk to her siblings on the phone.   Ahnika Hannibal J Teofila Bowery 12/17/2021, 9:48 AM

## 2021-12-17 NOTE — Progress Notes (Incomplete)
Pt's mood remains labile on interactions as she was more animated, dancing, hyper-verbal, hyperactive with pressured, tangential and rapid speech earlier this shift. However, pt observed with increased irritability / agitation and paranoia this evening especially during evening medication pass. Verbal outburst at medication window, demanding medication change to be done immediately "I did not ask for these other meds. That's too much and I don't want all that. I did not agree with it. I'm the one who have to take it at home". Took her medications with increased prompts this evening. Attended scheduled groups but was disruptive, dominating, out of groups multiple times. Support and reassurance offered to pt. Safety checks maintained at Q 15 minutes intervals without self harm gestures or outburst to note thus far. All medications administered as ordered and effects monitored. Pt encouraged to voice concerns. Tolerates meals and fluids well. Remains preoccupied, labile and continues to need multiple verbal redirections throughout this shift.

## 2021-12-17 NOTE — [facility] Group Notes (Signed)
Goals Group 12/17/21    Group Focus: affirmation, clarity of thought, and goals/reality orientation Treatment Modality:  Psychoeducation Interventions utilized were assignment, group exercise, and support Purpose: To be able to understand and verbalize the reason for their admission to the hospital. To understand that the medication helps with their chemical imbalance but they also need to work on their choices in life. To be challenged to develop a list of 30 positives about themselves. Also introduce the concept that "feelings" are not reality.  Participation Level:  poor  Participation Quality:  inappropriate  Affect:  Appropriate  Cognitive:  inappropriate  Insight:  lacking  Engagement in Group:  not Engaged  Additional Comments: Pt was having a difficult time within the group. Intrussive and rude to the other patients. Got up and walked out of the room several times, to only come back and start talking again interrupting the other patients. Angry and blaming others that she cannot speak.   [name]

## 2021-12-17 NOTE — Group Note (Signed)
°  [facility] LCSW Group Therapy Note  Date/Time:  12/17/2021 11:15AM-12:00PM  Type of Therapy and Topic:  Group Therapy:  Feelings About Hospitalization  Participation Level:  Minimal   Description of Group This process group involved patients discussing their feelings related to being hospitalized, as well as the benefits they see to being in the hospital.  These feelings and benefits were itemized.  The group then brainstormed specific ways in which they could seek those same benefits when they discharge and return home.  Therapeutic Goals Patient will identify and describe positive and negative feelings related to hospitalization Patient will verbalize benefits of hospitalization to themselves personally Patients will brainstorm together ways they can obtain similar benefits in the outpatient setting, identify barriers to wellness and possible solutions  Summary of Patient Progress:  The patient expressed her primary feelings about being hospitalized are "used and abused" and she does not need to be in the hospital.  Shontia was extremely intrusive and counterproductive throughout the entirety of the group.  She was asked many times to stop interrupting others.  She could not do so, made many inappropriate and negative comments.  She started to leave the room numerous times but would suddenly become bright, happy, and change her mind, sit down as though to participate positively.  But she quickly devolved into almost complete monopolization of group.  CSW told her several times that if she needed to do so, she could leave the room and that she needed to stop talking over people or she would be asked to leave the room.  Ultimately group was terminated early.  Therapeutic Modalities Cognitive Behavioral Therapy Motivational Interviewing    Selmer Dominion, LCSW 12/17/2021, 3:36 PM

## 2021-12-17 NOTE — Progress Notes (Signed)
Adult Psychoeducational Group Note  Date:  12/17/2021 Time:  8:46 PM  Group Topic/Focus:  Wrap-Up Group:   The focus of this group is to help patients review their daily goal of treatment and discuss progress on daily workbooks.  Participation Level:  Did Not Attend  Participation Quality:   Did Not Attend  Affect:  Did Not Attend  Cognitive:  Did Not Attend  Insight: None  Engagement in Group:  Did Not Attend  Modes of Intervention:  Did Not Attend  Additional Comments:  Pt was encouraged to attend wrap up group but did not attend  [name] 12/17/2021, 8:46 PM

## 2021-12-17 NOTE — Progress Notes (Signed)
°   12/17/21 0545  Sleep  Number of Hours 1.75

## 2021-12-17 NOTE — Progress Notes (Addendum)
[facility] MD Progress Note  12/17/2021 6:04 PM Page  MRN:  [id number]  Subjective:  Tayja reports: "It is good because I can't stay here but so many days, some people want the position because they are good in the glory. That's how my daddy is. We got the phone number since I was a kid". This is patient's response to this NP asking her how she is doing today.  Daily Notes: 12/17/2021: Pt with euphoric mood and congruent affect.  Attention to personal hygiene and grooming is fair, eye contact is good, speech is somewhat garbled & incoherent. Thought contents are disorganized and illogical, pt currently has flight of ideas, and is very tangential in her response to questions. Pt with jumping from topic to topic when answering questions. Pt with delusional thoughts, Talks about her father who died, but continues to be present, talks about her Physician in [place] "[place] me up in school", talks about her home, a storage building, etc. Pt however denies SI/HI/AVH, and denies paranoia.  Pt reports a good appetite and reports a good sleep quality last night, but as per documentation from last night, she slept a  total of 1.75 hrs last night. Pt observed to have tremors to b/l arms, ambulates with a shuffling gait. Symmetrel 100 mg daily reordered for tremors, Cogentin increased to 1 mg BID.   Current BP is 143/81, HR is 104. Will -Continue Propanolol ER 60 mg once daily  -Continue to hold Losartan 25 mg daily.  Reason For Admission: Imani is a 56 year old female with a longstanding psychiatric history of schizoaffective disorder- bipolar type complicated by medication noncompliance and previous suicide attempt via OD who was admitted voluntarily from [facility] for bizarre behaviors and nonsensical speech after medication noncompliance. While in the ED, she was given Geodon x1 on 12/27, Ativan x1 when refusing meds and unable to de-escalate on 12/28.  Principal Problem: Schizoaffective disorder,  bipolar type ([place]) Diagnosis: Principal Problem:   Schizoaffective disorder, bipolar type ([place]) Active Problems:   Hyperthyroidism   Benign essential HTN   AKI (acute kidney injury) ([place])   Cognitive dysfunction in chronic schizophrenia ([place])  Total Time spent with patient: 20 minutes  Past Psychiatric History: See H&P  Past Medical History:  Past Medical History:  Diagnosis Date   Bipolar affective disorder ([place])    Bipolar disorder ([place])    History of arthritis    History of chicken pox    History of depression    History of genital warts    history of heart murmur    History of high blood pressure    History of thyroid disease    History of UTI    Hypertension    Low TSH level 07/13/2017   Schizophrenia ([place])     Past Surgical History:  Procedure Laterality Date   ABLATION ON ENDOMETRIOSIS     CYST REMOVAL NECK     around 11 years ago /benign   MULTIPLE TOOTH EXTRACTIONS     Family History:  Family History  Problem Relation Age of Onset   Arthritis Father    Hyperlipidemia Father    High blood pressure Father    Diabetes Sister    Diabetes Mother    Diabetes Brother    Mental illness Brother    Alcohol abuse Paternal Uncle    Alcohol abuse Paternal Grandfather    Breast cancer Maternal Aunt    Breast cancer Paternal Aunt    High blood pressure Sister  Mental illness Other        runs in family   Family Psychiatric  History:  See H&P Social History:  Social History   Substance and Sexual Activity  Alcohol Use Not Currently     Social History   Substance and Sexual Activity  Drug Use Not Currently    Social History   Socioeconomic History   Marital status: Single    Spouse name: Not on file   Number of children: Not on file   Years of education: Not on file   Highest education level: Patient refused  Occupational History   Not on file  Tobacco Use   Smoking status: Never   Smokeless tobacco: Never  Vaping Use   Vaping Use: Never used   Substance and Sexual Activity   Alcohol use: Not Currently   Drug use: Not Currently   Sexual activity: Not Currently  Other Topics Concern   Not on file  Social History Narrative   ** Merged History Encounter **       Social Determinants of Health   Financial Resource Strain: Not on file  Food Insecurity: Not on file  Transportation Needs: Not on file  Physical Activity: Not on file  Stress: Not on file  Social Connections: Not on file   Additional Social History:      Sleep: Poor  Appetite:  Good  Current Medications: Current Facility-Administered Medications  Medication Dose Route Frequency Provider Last Rate Last Admin   acetaminophen (TYLENOL) tablet 650 mg  650 mg Oral Q6H PRN Ethelene Hal, NP   650 mg at 11/27/21 2347   alum & mag hydroxide-simeth (MAALOX/MYLANTA) 200-200-20 MG/5ML suspension 30 mL  30 mL Oral Q4H PRN Ethelene Hal, NP       amantadine (SYMMETREL) capsule 100 mg  100 mg Oral Daily Nicholes Rough, NP   100 mg at 12/17/21 1017   benztropine (COGENTIN) tablet 1 mg  1 mg Oral BID Nicholes Rough, NP       Or   benztropine mesylate (COGENTIN) injection 1 mg  1 mg Intramuscular BID Nkwenti, Doris, NP       diclofenac Sodium (VOLTAREN) 1 % topical gel 2 g  2 g Topical QID Damita Dunnings B, MD   2 g at 12/17/21 0802   diphenhydrAMINE (BENADRYL) injection 50 mg  50 mg Intramuscular Q6H PRN Massengill, Ovid Curd, MD   50 mg at 12/13/21 2250   divalproex (DEPAKOTE ER) 24 hr tablet 750 mg  750 mg Oral Q supper Damita Dunnings B, MD   750 mg at 12/17/21 1713   donepezil (ARICEPT) tablet 5 mg  5 mg Oral Q supper Damita Dunnings B, MD   5 mg at 12/17/21 1714   haloperidol (HALDOL) tablet 5 mg  5 mg Oral Q6H PRN Maida Sale, MD       And   LORazepam (ATIVAN) tablet 1 mg  1 mg Oral Q6H PRN Hill, Jackie Plum, MD       haloperidol (HALDOL) tablet 5 mg  5 mg Oral TID Damita Dunnings B, MD   5 mg at 12/17/21 1714   Or   haloperidol lactate (HALDOL)  injection 5 mg  5 mg Intramuscular TID Damita Dunnings B, MD   5 mg at 12/16/21 1837   haloperidol lactate (HALDOL) injection 5 mg  5 mg Intramuscular Q6H PRN Maida Sale, MD   5 mg at 12/11/21 2123   LORazepam (ATIVAN) injection 1 mg  1 mg Intramuscular  Q6H PRN Harlow Asa, MD   1 mg at 12/14/21 1909   melatonin tablet 10 mg  10 mg Oral QHS Hill, Jackie Plum, MD   10 mg at 12/16/21 2057   methimazole (TAPAZOLE) tablet 5 mg  5 mg Oral BID Massengill, Ovid Curd, MD   5 mg at 12/17/21 1713   OLANZapine zydis (ZYPREXA) disintegrating tablet 5 mg  5 mg Oral BID Damita Dunnings B, MD   5 mg at 12/17/21 1714   Or   OLANZapine (ZYPREXA) injection 5 mg  5 mg Intramuscular BID Damita Dunnings B, MD   5 mg at 12/16/21 1700   pantoprazole (PROTONIX) EC tablet 20 mg  20 mg Oral Daily Rosezetta Schlatter, MD   20 mg at 12/17/21 0803   polyethylene glycol (MIRALAX / GLYCOLAX) packet 17 g  17 g Oral Daily Damita Dunnings B, MD   17 g at 12/17/21 0802   propranolol ER (INDERAL LA) 24 hr capsule 60 mg  60 mg Oral Daily Massengill, Ovid Curd, MD   60 mg at 12/17/21 0806   temazepam (RESTORIL) capsule 30 mg  30 mg Oral QHS Damita Dunnings B, MD   30 mg at 12/16/21 2057   white petrolatum (VASELINE) gel   Topical PRN Rosezetta Schlatter, MD   Given at 12/04/21 1516    Lab Results:  No results found for this or any previous visit (from the past 48 hour(s)).   Blood Alcohol level:  Lab Results  Component Value Date   ETH <10 11/22/2021   ETH <10 82/95/6213    Metabolic Disorder Labs: Lab Results  Component Value Date   HGBA1C 6.1 (H) 11/24/2021   MPG 128.37 11/24/2021   MPG 119.76 03/28/2021   Lab Results  Component Value Date   PROLACTIN 11.0 03/14/2019   Lab Results  Component Value Date   CHOL 127 11/24/2021   TRIG 161 (H) 11/24/2021   HDL 39 (L) 11/24/2021   CHOLHDL 3.3 11/24/2021   VLDL 32 11/24/2021   LDLCALC 56 11/24/2021   LDLCALC 95 10/26/2020   Physical Findings: AIMS: Facial and Oral  Movements Muscles of Facial Expression: None, normal Lips and Perioral Area: None, normal Jaw: None, normal Tongue: None, normal,Extremity Movements Upper (arms, wrists, hands, fingers): Mild Lower (legs, knees, ankles, toes): None, normal, Trunk Movements Neck, shoulders, hips: None, normal, Overall Severity Severity of abnormal movements (highest score from questions above): None, normal Incapacitation due to abnormal movements: None, normal Patient's awareness of abnormal movements (rate only patient's report): No Awareness, Dental Status Current problems with teeth and/or dentures?: No Does patient usually wear dentures?: No  CIWA:  n/a COWS:  n/a  Musculoskeletal: Strength & Muscle Tone: within normal limits Gait & Station: steady but shuffling gait Patient leans: N/A  Psychiatric Specialty Exam:  Presentation  General Appearance: Fairly groomed  Eye Contact:Fair  Speech:Garbled; Pressured  Speech Volume:Normal  Handedness:Right  Mood and Affect  Mood:Euphoric  Affect:Labile  Thought Process  Thought Processes:Disorganized  Descriptions of Associations:Tangential  Orientation:Partial  Thought Content:Illogical  History of Schizophrenia/Schizoaffective disorder:Yes  Duration of Psychotic Symptoms:Greater than six months  Hallucinations:Hallucinations: None  Ideas of Reference:Delusions  Suicidal Thoughts:Suicidal Thoughts: No  Homicidal Thoughts:Homicidal Thoughts: No  Sensorium  Memory:Immediate Good  Judgment:Impaired  Insight:Poor  Executive Functions  Concentration:Poor  Attention Span:Poor  Recall:Poor  Fund of Knowledge:Poor  Language:Fair  Psychomotor Activity  Psychomotor Activity: shuffling gait and bilateral pill rolling tremor at rest c/w parkinsonian symptoms  Assets  Assets:Housing  Sleep  Sleep:Sleep:  Poor  Physical Exam: Physical Exam HENT:     Head: Normocephalic and atraumatic.  Pulmonary:     Effort:  Pulmonary effort is normal.  Neurological:     Mental Status: She is alert.   Review of Systems  Constitutional: Negative.   HENT: Negative.    Eyes: Negative.   Respiratory: Negative.    Cardiovascular: Negative.   Gastrointestinal: Negative.   Genitourinary: Negative.   Musculoskeletal: Negative.   Skin: Negative.   Neurological: Negative.   Psychiatric/Behavioral:  Negative for hallucinations and suicidal ideas. The patient has insomnia.   Blood pressure (!) 143/81, pulse (!) 104, temperature 97.7 F (36.5 C), temperature source Oral, resp. rate 18, height 5' (1.524 m), weight 76.7 kg, SpO2 99 %. Body mass index is 33.01 kg/m.  Treatment Plan Summary: Daily contact with patient to assess and evaluate symptoms and progress in treatment and Medication management  Schizoaffective Disorder- Bipolar type: IVC first eval completed 12-02-21. IVC served to pt 12-02-21. 24 hour eval completed 12-02-21.   FORCED MED ORDER Started 1/5  - 1/12!!!! Second FORCED MED ORDER 1/13-1/20!!! -Haldol 5 mg TID  or 5 mg IM if patient refuses p.o. - Zyprexa 5mg  BID PO or IM if patient refuses MUST BE 1+ HR from Ativan or Haldol administration  - Increase Cogentin 1mg  PO BID or IM if patient refuses  for parkinsonian symptoms - Change Haldol to 5mg  qam and 10mg  at supper with forced IM backup if refuses (to reduce number of daily injections and help with compliance) - Discontinue Restoril and start trial of Ambien 5mg  po qhs - Continue Ativan 1mg  q6h PRN -Continue Depakote ER 750mg   -(1/9) VA level: 117, CBC-WNL, hepatic function panel-AST 12 , follow-up repeat Depakote level (14) , CBC (WNL), and CMP - Change to Zyprexa 10mg  once a day with IM backup if refuses (to reduce number of daily injections and help with compliance) - Start Amantadine 100mg  every day, for EPS Pt's tremor has worsened since stopping amantadine - will watch for worsening psychosis with restart of medication -Continue Aricept 5mg   QHS for cognitive impairment, patient repsoding well, consider increase to BID in a 3-4 days EKG- QTC 437 (1/15) -Discontinue Clozapine 12.5mg  qhs due to non compliance   HTN   Tachycardia: -Continue Propanolol ER 60 mg once daily  -Continue to hold Losartan 25 mg daily - Creatinine 1.42> 1.60>1.29>1.29>1.52   AKI- improving, stable: Continuing to push PO fluids -Encourage PO fluid intake: Gatorade and H2O   Hyperthyroidism: Spoke with hospitalist (1/3)  -Continue methimazole at 5 mg bid  -TSH (12/29)- 6.847>>> 12.328 (1/3)>>>6.397 (12-02-21) -Free T4 0.84 wnl (12-02-21)  -Follow up with PCP for further management   Constipation - Continue Miralax daily - KUB, noted distended bladder and moderate stool burden - Continue Aricept   Other PRNs:  -Continue Protonix 20 mg daily -Continue PRN's: Tylenol, Maalox, Atarax, Milk of Magnesia, Trazodone   Toe pain - Volatren gel QID   Nicholes Rough, NP 12/17/2021, 6:04 PM  Patient ID: Horald Chestnut Tirone, female   DOB: [birth year], 56 y.o.   MRN: [id number]

## 2021-12-17 NOTE — Progress Notes (Signed)
Pt has been extremely manic ever since change of shift. Pt has been intrusive and annoying other pts in the dayroom because they cannot hear the tv over her consistently talking. Pt has been redirected all shift long but pt is not listening and continues to be a disturbance to others on the unit. Pt will continued to be monitored and redirected while on the unit.

## 2021-12-18 MED ORDER — CLONAZEPAM 1 MG PO TABS
1.0000 mg | ORAL_TABLET | Freq: Two times a day (BID) | ORAL | Status: DC
  Administered 2021-12-18: 1 mg via ORAL
  Filled 2021-12-18 (×2): qty 1

## 2021-12-18 MED ORDER — ZOLPIDEM TARTRATE 5 MG PO TABS: 5.0000 mg | ORAL_TABLET | Freq: Every day | ORAL | Status: DC

## 2021-12-18 MED ORDER — ZOLPIDEM TARTRATE 5 MG PO TABS
10.0000 mg | ORAL_TABLET | Freq: Every day | ORAL | Status: DC
  Administered 2021-12-18 – 2021-12-23 (×5): 10 mg via ORAL
  Filled 2021-12-18 (×6): qty 2

## 2021-12-18 MED ORDER — CLONAZEPAM 1 MG PO TABS: 1.0000 mg | ORAL_TABLET | Freq: Two times a day (BID) | ORAL | Status: DC

## 2021-12-18 NOTE — Group Note (Signed)
[facility] LCSW Group Therapy Note  Date/Time:  12/18/2021  11:00AM-12:00PM  Type of Therapy and Topic:  Group Therapy:  Music and Mood  Participation Level:  Did Not Attend - The patient was not allowed to attend group.  She did seat herself in the room at the beginning and was angered when another agitated patient was interrupted and redirected due to inappropriate language.  She left the room in anger.  Later she knocked on the door multiple times to gain admission, but CSW shook her head "no" each time.    Description of Group: In this process group, members listened to a variety of genres of music and identified that different types of music evoke different responses.  Patients were encouraged to identify music that was soothing for them and music that was energizing for them.  Patients discussed how this knowledge can help with wellness and recovery in various ways including managing depression and anxiety as well as encouraging healthy sleep habits.    Therapeutic Goals: Patients will explore the impact of different varieties of music on mood Patients will verbalize the thoughts they have when listening to different types of music Patients will identify music that is soothing to them as well as music that is energizing to them Patients will discuss how to use this knowledge to assist in maintaining wellness and recovery Patients will explore the use of music as a coping skill  Summary of Patient Progress:  N/A  Therapeutic Modalities: Solution Focused Brief Therapy Activity   Selmer Dominion, LCSW

## 2021-12-18 NOTE — [facility] Group Notes (Signed)
Psychoeducational Group Note  Date:  12/04/2021 Time:  1300-1400   Group Topic/Focus: This is a continuation of the group from Saturday. Pt's have been asked to formulate a list of 30 positives about themselves. This list is to be read 2 times a day for 30 days, looking in a mirror. Changing patterns of negative self talk. Also discussed is the fact that there have been some people who hurt [place] in the past. We keep that memory alive within [place]. Ways to cope with this are discused   Participation Level:  Active  Participation Quality:  Appropriate  Affect:  Appropriate  Cognitive:  Oriented  Insight: Improving  Engagement in Group:  Engaged  Modes of Intervention:  Activity, Discussion, Education, and Support  Additional Comments:  pt was in the group. Intrusive, but less so than yesterday. Continued to interrupt others but was able to accept redirection. Stated that her strong points are in her head and she did not want to share them with the group. Did stay for the entrie group and did not leave even when redirected to allow others to speak.  [name]

## 2021-12-18 NOTE — Progress Notes (Addendum)
D: Patient is observed in the day room. She is hyperverbal with disorganized speech and thought process. She denies any thoughts of self harm. She is responding to internal stimuli. She is unable to participate in group as she is disruptive to her peers. Patient is intrusive with staff, interrupting their conversations with other patients. Patient needs redirection throughout the day. Patient's speech is disorganized and tangential. Patient's hygiene is good; however, she did come out in the hallway earlier with shorts and a tank top. She was told that she would need to put on some other clothes. She states, "when is the doctor coming because I'll be out the rest of the day." She is focused on which staff is a Scientist, water quality" as that is who she wants to talk to. Explained to patient that she can come to me with any of her needs today; however, she must set boundaries with staff. Patient's goal today is "going home." Patient's listed tremors, agitation, chilling and irritability as her physical symptoms today.   A: Continue to monitor medication management and MD orders.  Safety checks completed every 15 minutes per protocol.  Offer support and encouragement as needed.  R: Patient needs constant redirection when she is in the hallway. She cannot attend group activities at this time, as she is disruptive and talks throughout the entire group. Discussed with SW earlier in treatment team regarding her attendance at groups. It was decided that she is unable to attend at this time.    12/18/21 0800  Psych Admission Type (Psych Patients Only)  Admission Status Involuntary  Psychosocial Assessment  Patient Complaints Worrying;Hyperactivity  Eye Contact Fair  Facial Expression Animated;Wide-eyed  Affect Labile;Sullen  Speech Incoherent;Pressured  Interaction Intrusive;Demanding  Motor Activity Shuffling;Restless;Hyperactive  Appearance/Hygiene Unremarkable  Behavior Characteristics Unable to participate  Mood  Irritable  Thought Process  Coherency Disorganized  Content WDL  Delusions None reported or observed  Perception Hallucinations  Hallucination Auditory;Visual  Judgment Poor  Confusion Mild  Danger to Self  Current suicidal ideation? Denies  Danger to Others  Danger to Others None reported or observed

## 2021-12-18 NOTE — Progress Notes (Addendum)
[facility] MD Progress Note  12/18/2021 4:14 PM Mekaila Tarnow Gosnell  MRN:  [id number]  Subjective:  Orlinda reports: "I'm grooving. I need to go home and take care of my business. I want to talk to my brother". This was in response to being asked how she is doing today.  Daily Notes: 12/18/2021: Pt with euphoric mood and congruent affect.  Attention to personal hygiene and grooming is fair, eye contact is good, speech is somewhat garbled, pressured & incoherent. Thought contents are disorganized and illogical, pt continues to have flight of ideas, and is very tangential in her response to questions. Pt continues to jump from topic to topic when answering questions. Pt with delusional thoughts, states that she can feel her father's presence, talks about her brother, about houses,etc. Pt however denies SI/HI/AVH, ideas of reference, and denies paranoia.  Pt reports a good appetite and reports a good sleep quality last night, but as per nursing report, she did not sleep well last night.  Pt observed to have tremors to b/l arms rendering it difficult for her to hold a cup of iced water and bring it to her mouth without difficulty but tremor at rest minimally improved. Pt ambulates with a shuffling gait. Symmetrel 100 mg daily was reordered for tremors yesterday (1/21), Cogentin increased to 1 mg BID on 1/21 as well. Will increase Ambien to 10 mg nightly to help with insomnia, and will start Clonazepam 1 mg BID for restlessness. Pt denies being in any physical pain.  Current V/Ss as follows: Blood pressure (!) 128/95, pulse 69, temperature 97.7 F (36.5 C), temperature source Oral, resp. rate 18. Will continue Propanolol ER 60 mg once daily for blood pressure control.   Reason For Admission: Marizol is a 56 year old female with a longstanding psychiatric history of schizoaffective disorder- bipolar type complicated by medication noncompliance and previous suicide attempt via OD who was admitted voluntarily from [facility]  for bizarre behaviors and nonsensical speech after medication noncompliance. While in the ED, she was given Geodon x1 on 12/27, Ativan x1 when refusing meds and unable to de-escalate on 12/28.  Principal Problem: Schizoaffective disorder, bipolar type ([place]) Diagnosis: Principal Problem:   Schizoaffective disorder, bipolar type ([employer]) Active Problems:   Hyperthyroidism   Benign essential HTN   AKI (acute kidney injury) ([place])   Cognitive dysfunction in chronic schizophrenia ([place])  Total Time spent with patient: 20 minutes  Past Psychiatric History: See H&P  Past Medical History:  Past Medical History:  Diagnosis Date   Bipolar affective disorder ([place])    Bipolar disorder ([place])    History of arthritis    History of chicken pox    History of depression    History of genital warts    history of heart murmur    History of high blood pressure    History of thyroid disease    History of UTI    Hypertension    Low TSH level 07/13/2017   Schizophrenia ([name])     Past Surgical History:  Procedure Laterality Date   ABLATION ON ENDOMETRIOSIS     CYST REMOVAL NECK     around 11 years ago /benign   MULTIPLE TOOTH EXTRACTIONS     Family History:  Family History  Problem Relation Age of Onset   Arthritis Father    Hyperlipidemia Father    High blood pressure Father    Diabetes Sister    Diabetes Mother    Diabetes Brother    Mental illness Brother  Alcohol abuse Paternal Uncle    Alcohol abuse Paternal Grandfather    Breast cancer Maternal Aunt    Breast cancer Paternal Aunt    High blood pressure Sister    Mental illness Other        runs in family   Family Psychiatric  History:  See H&P Social History:  Social History   Substance and Sexual Activity  Alcohol Use Not Currently     Social History   Substance and Sexual Activity  Drug Use Not Currently    Social History   Socioeconomic History   Marital status: Single    Spouse name: Not on file   Number of  children: Not on file   Years of education: Not on file   Highest education level: Patient refused  Occupational History   Not on file  Tobacco Use   Smoking status: Never   Smokeless tobacco: Never  Vaping Use   Vaping Use: Never used  Substance and Sexual Activity   Alcohol use: Not Currently   Drug use: Not Currently   Sexual activity: Not Currently  Other Topics Concern   Not on file  Social History Narrative   ** Merged History Encounter **       Social Determinants of Health   Financial Resource Strain: Not on file  Food Insecurity: Not on file  Transportation Needs: Not on file  Physical Activity: Not on file  Stress: Not on file  Social Connections: Not on file   Additional Social History:      Sleep: Poor  Appetite:  Good  Current Medications: Current Facility-Administered Medications  Medication Dose Route Frequency Provider Last Rate Last Admin   acetaminophen (TYLENOL) tablet 650 mg  650 mg Oral Q6H PRN Ethelene Hal, NP   650 mg at 11/27/21 2347   alum & mag hydroxide-simeth (MAALOX/MYLANTA) 200-200-20 MG/5ML suspension 30 mL  30 mL Oral Q4H PRN Ethelene Hal, NP       amantadine (SYMMETREL) capsule 100 mg  100 mg Oral Daily Nkwenti, Doris, NP   100 mg at 12/18/21 0729   benztropine (COGENTIN) tablet 1 mg  1 mg Oral BID Nicholes Rough, NP   1 mg at 12/18/21 2376   Or   benztropine mesylate (COGENTIN) injection 1 mg  1 mg Intramuscular BID Nicholes Rough, NP       clonazePAM (KLONOPIN) tablet 1 mg  1 mg Oral BID Nkwenti, Doris, NP       diclofenac Sodium (VOLTAREN) 1 % topical gel 2 g  2 g Topical QID Damita Dunnings B, MD   2 g at 12/17/21 0802   diphenhydrAMINE (BENADRYL) injection 50 mg  50 mg Intramuscular Q6H PRN Massengill, Ovid Curd, MD   50 mg at 12/13/21 2250   divalproex (DEPAKOTE ER) 24 hr tablet 750 mg  750 mg Oral Q supper Damita Dunnings B, MD   750 mg at 12/17/21 1713   donepezil (ARICEPT) tablet 5 mg  5 mg Oral Q supper Damita Dunnings B, MD   5 mg at 12/17/21 1714   haloperidol (HALDOL) tablet 10 mg  10 mg Oral QHS Karolina Zamor E, MD       Or   haloperidol lactate (HALDOL) injection 10 mg  10 mg Intramuscular QHS Jazz Biddy E, MD       haloperidol (HALDOL) tablet 5 mg  5 mg Oral Q6H PRN Hill, Jackie Plum, MD       And   LORazepam (ATIVAN) tablet 1  mg  1 mg Oral Q6H PRN Maida Sale, MD       haloperidol (HALDOL) tablet 5 mg  5 mg Oral Daily Nelda Marseille, Micca Matura E, MD   5 mg at 12/18/21 4944   Or   haloperidol lactate (HALDOL) injection 5 mg  5 mg Intramuscular Daily Nelda Marseille, Rabab Currington E, MD       haloperidol lactate (HALDOL) injection 5 mg  5 mg Intramuscular Q6H PRN Maida Sale, MD   5 mg at 12/11/21 2123   LORazepam (ATIVAN) injection 1 mg  1 mg Intramuscular Q6H PRN Harlow Asa, MD   1 mg at 12/14/21 1909   melatonin tablet 10 mg  10 mg Oral QHS Hill, Jackie Plum, MD   10 mg at 12/17/21 2123   methimazole (TAPAZOLE) tablet 5 mg  5 mg Oral BID Massengill, Ovid Curd, MD   5 mg at 12/18/21 0728   OLANZapine zydis (ZYPREXA) disintegrating tablet 10 mg  10 mg Oral Daily Nelda Marseille, Madelaine Whipple E, MD       Or   OLANZapine (ZYPREXA) injection 10 mg  10 mg Intramuscular Daily Nelda Marseille, Tomekia Helton E, MD       pantoprazole (PROTONIX) EC tablet 20 mg  20 mg Oral Daily Rosezetta Schlatter, MD   20 mg at 12/18/21 9675   polyethylene glycol (MIRALAX / GLYCOLAX) packet 17 g  17 g Oral Daily Damita Dunnings B, MD   17 g at 12/17/21 0802   propranolol ER (INDERAL LA) 24 hr capsule 60 mg  60 mg Oral Daily Massengill, Ovid Curd, MD   60 mg at 12/18/21 9163   white petrolatum (VASELINE) gel   Topical PRN Rosezetta Schlatter, MD   Given at 12/04/21 1516   zolpidem (AMBIEN) tablet 5 mg  5 mg Oral QHS Nkwenti, Doris, NP        Lab Results:  No results found for this or any previous visit (from the past 29 hour(s)).   Blood Alcohol level:  Lab Results  Component Value Date   ETH <10 11/22/2021   ETH <10 84/66/5993    Metabolic  Disorder Labs: Lab Results  Component Value Date   HGBA1C 6.1 (H) 11/24/2021   MPG 128.37 11/24/2021   MPG 119.76 03/28/2021   Lab Results  Component Value Date   PROLACTIN 11.0 03/14/2019   Lab Results  Component Value Date   CHOL 127 11/24/2021   TRIG 161 (H) 11/24/2021   HDL 39 (L) 11/24/2021   CHOLHDL 3.3 11/24/2021   VLDL 32 11/24/2021   LDLCALC 56 11/24/2021   LDLCALC 95 10/26/2020   Physical Findings: AIMS: Facial and Oral Movements Muscles of Facial Expression: None, normal Lips and Perioral Area: None, normal Jaw: None, normal Tongue: None, normal,Extremity Movements Upper (arms, wrists, hands, fingers): Mild Lower (legs, knees, ankles, toes): None, normal, Trunk Movements Neck, shoulders, hips: None, normal, Overall Severity Severity of abnormal movements (highest score from questions above): None, normal Incapacitation due to abnormal movements: None, normal Patient's awareness of abnormal movements (rate only patient's report): No Awareness, Dental Status Current problems with teeth and/or dentures?: No Does patient usually wear dentures?: No  CIWA:  n/a COWS:  n/a AIMS-2  Musculoskeletal: Strength & Muscle Tone: within normal limits Gait & Station: steady but shuffling gait Patient leans: N/A  Psychiatric Specialty Exam:  Presentation  General Appearance: Fairly groomed, obese  Eye Contact:Good  Speech:rambling, coherent but pressured at times  Speech Volume:Normal  Handedness:Right  Mood and Affect  Mood:Anxious  Affect:Congruent  Thought  Process  Thought Processes:Disorganized  Orientation:uncooperative for questioning  Thought Content: Illogical, flight of ideas  History of Schizophrenia/Schizoaffective disorder:Yes  Duration of Psychotic Symptoms:Greater than six months  Hallucinations:Denied  Ideas of Reference:Denied  Suicidal Thoughts:Suicidal Thoughts: No  Homicidal Thoughts:Homicidal Thoughts: No  Sensorium   Memory:Immediate - Fair  Judgment:Poor  Insight:Poor  Executive Functions  Concentration:Poor  Attention Span:Poor  Recall:Poor  Fund of Knowledge:Poor  Language:Fair  Psychomotor Activity  Psychomotor Activity: shuffling gait and bilateral pill rolling tremor at rest c/w parkinsonian symptoms - but improving  Assets  Assets:Leisure Time  Sleep  Total time unrecorded  Physical Exam HENT:     Head: Normocephalic and atraumatic.     Nose: Nose normal.  Pulmonary:     Effort: Pulmonary effort is normal.  Neurological:     Mental Status: She is alert.   Review of Systems  Constitutional: Negative.   HENT: Negative.    Eyes: Negative.   Respiratory: Negative.    Cardiovascular: Negative.   Gastrointestinal: Negative.   Genitourinary: Negative.   Musculoskeletal: Negative.   Skin: Negative.   Neurological: Negative.   Psychiatric/Behavioral:  Negative for hallucinations and suicidal ideas. The patient has insomnia.   Blood pressure (!) 128/95, pulse 69, temperature 97.7 F (36.5 C), temperature source Oral, resp. rate 18, height 5' (1.524 m), weight 76.7 kg, SpO2 99 %. Body mass index is 33.01 kg/m.  Treatment Plan Summary: Daily contact with patient to assess and evaluate symptoms and progress in treatment and Medication management  Schizoaffective Disorder- Bipolar type: IVC first eval completed 12-02-21. IVC served to pt 12-02-21. 24 hour eval completed 12-02-21.   FORCED MED ORDER Started 1/5  - 1/12!!!! Second FORCED MED ORDER 1/13-1/20!!! -Haldol 5 mg TID  or 5 mg IM if patient refuses p.o. - Zyprexa 5mg  BID PO or IM if patient refuses MUST BE 1+ HR from Ativan or Haldol administration  - Continue Cogentin 1mg  PO BID or IM if patient refuses  for parkinsonian symptoms (continue Miralax qd and watch for constipation) - Discontinued Restoril 12/17/21 due to ineffectiveness - Continue Haldol 5mg  qam and 10mg  at supper with forced IM backup if refuses  (consolidated to bid to reduce number of daily injections and help with compliance) - Continue Ativan 1mg  q6h PRN -Continue Depakote ER 750mg   -(1/9) VA level: 117, CBC-WNL, hepatic function panel-AST 12 , follow-up repeat Depakote level (14) , CBC (WNL), and AST 21 and ALT 14 - will need repeat VPA level once she is consistently taking medication  - Continue Zyprexa 10mg  once a day with IM backup if refuses (consolidated to qd to reduce number of daily injections and help with compliance) - Continue Amantadine 100mg  every day, for EPS.  - will watch for worsening psychosis with restart of medication -Continue Aricept 5mg  QHS for cognitive impairment, patient repsoding well, consider increase to BID in a 3-4 days EKG- QTC 437 (1/15) -Discontinued Clozapine 12.5mg  qhs due to non compliance (12/18/21) - can attempt to resume if she consistently starts taking o medication   HTN   Tachycardia: -Continue Propanolol ER 60 mg once daily  -Continue to hold Losartan 25 mg daily - Creatinine 1.42> 1.60>1.29>1.29>1.52   Anxiety & Restlessness -Start Clonazepam 1 mg BID  AKI- improving, stable: -Continuing to push PO fluids -Encourage PO fluid intake: Gatorade and H2O   Insomnia Increase Ambien to 10 mg nightly (started on 1/21, increased to 10 mg 1/22) Continue Melatonin 10mg  qhs  Hyperthyroidism: Spoke with hospitalist (1/3)  -Continue  methimazole at 5 mg bid  -TSH (12/29)- 6.847>>> 12.328 (1/3)>>>6.397 (12-02-21) -Free T4 0.84 wnl (12-02-21)  -Follow up with PCP for further management   Constipation - Continue Miralax daily - KUB, noted distended bladder and moderate stool burden - Continue Aricept   Other PRNs:  -Continue Protonix 20 mg daily -Continue PRN's: Tylenol, Maalox, Atarax, Milk of Magnesia, Trazodone   Toe pain - Volatren gel QID   Nicholes Rough, NP 12/18/2021, 4:14 PM  Patient ID: Horald Chestnut Hires, female   DOB: [birth year], 56 y.o.   MRN: [id number]

## 2021-12-18 NOTE — Progress Notes (Signed)
°   12/17/21 2124  Psych Admission Type (Psych Patients Only)  Admission Status Involuntary  Psychosocial Assessment  Patient Complaints Anxiety;Worrying;Hyperactivity;Irritability  Eye Contact Fair  Facial Expression Animated;Anxious  Affect Preoccupied  Speech Incoherent;Rapid;Pressured  Interaction Assertive;Demanding  Motor Activity Shuffling;Restless  Appearance/Hygiene Improved  Behavior Characteristics Cooperative  Mood Pleasant  Thought Process  Coherency Disorganized;Flight of ideas  Content WDL  Delusions None reported or observed  Perception Hallucinations  Hallucination Auditory;Visual  Judgment Poor  Confusion Moderate  Danger to Self  Current suicidal ideation? Denies  Danger to Others  Danger to Others None reported or observed

## 2021-12-19 DIAGNOSIS — F250 Schizoaffective disorder, bipolar type: Secondary | ICD-10-CM | POA: Diagnosis not present

## 2021-12-19 DIAGNOSIS — G9349 Other encephalopathy: Secondary | ICD-10-CM | POA: Diagnosis not present

## 2021-12-19 DIAGNOSIS — N179 Acute kidney failure, unspecified: Secondary | ICD-10-CM | POA: Diagnosis not present

## 2021-12-19 DIAGNOSIS — Z20822 Contact with and (suspected) exposure to covid-19: Secondary | ICD-10-CM | POA: Diagnosis not present

## 2021-12-19 NOTE — Progress Notes (Signed)
D: Wyoma was pacing the unit at the beginning of the shift.  She was loud and intrusive requiring constant redirection.  She denied SI/HI or AVH but is clearly responding to internal stimuli.  She was argumentative about her medications stating "I have taken everything already" and walked away from staff.  15 minutes later she came up and asked for her medications.  She is however argumentative about her current bedtime medications because "I don't take Ambien, I take Restoril."  She is unable to accept that she has been taken off Restoril.  She did finally take the medications with much encouragement. Her mood is labile and irritable.  Thoughts continue to be disorganized and tangential.  She appears to be in no physical distress.   A:  1:1 with RN for support and encouragement.  Medications given as ordered.  Q 15 minute checks maintained for safety.  Encouraged participation in group and unit activities.   R:  She is currently resting with her eyes closed and appears to be asleep.  She remains safe on the unit.  We will continue to monitor the progress towards her goals.

## 2021-12-19 NOTE — Group Note (Addendum)
Recreation Therapy Group Note   Group Topic:Coping Skills  Group Date: 12/19/2021 Start Time: 1000 End Time: 3888 Facilitators: [name], LRT,CTRS Location: [address] Dayroom   Goal Area(s) Addresses: Patient will define what a coping skill is. Patient will work to create a list of healthy coping skills beginning with each letter of the alphabet. Patient will successfully identify positive coping skills they can use post d/c.  Patient will acknowledge benefit(s) of using learned coping skills post d/c.   Group Description: Coping A to Z. Patient asked to identify what a coping skill is and when they use them. Patients with Probation officer discussed healthy versus unhealthy coping skills. Next patients were given a blank worksheet titled "Coping Skills A-Z". Patients were instructed to come up with at least one positive coping skill per letter of the alphabet, addressing a specific challenge (ex: stress, anger, anxiety, depression, grief, doubt, isolation, self-harm/suicidal thoughts, substance use). Patients were given 15 minutes to brainstorm with, before ideas were presented to the large group. Patients and LRT debriefed on the importance of coping skill selection based on situation and back-up plans when a skill tried is not effective. At the end of group, patients were given an handout of alphabetized strategies to keep for future reference.   Affect/Mood: Manic   Participation Level: Hyperverbal   Participation Quality: Maximum Cues   Behavior: Hyperverbal   Speech/Thought Process: Delusional   Insight: Fair   Judgement: Fair    Modes of Intervention: Worksheet   Patient Response to Interventions:  Engaged and Receptive   Education Outcome:  Acknowledges education and In group clarification offered    Clinical Observations/Individualized Feedback: Pt defined a coping skill as something that "helps you cope in bad and good situations".  Pt gave two examples of coping skills  as reading the bible and tuning things out or in.  Pt still had moments of laughing at inappropriate times.  Pt needed constant redirection from inappropriate laughing and going verbal tangents that had nothing to do with the topic.  Pt was given depression to identify coping skills for.  Pt came up some coping skills such as, always remember who I am, continue to pray, elevate self, forget the past but remember, have a positive support system, Jesus, love always, quietness and talking with a trusted friend.  Pt identified the coping skills she uses the most as venting and the bible.    Plan: Continue to engage patient in RT group sessions 2-3x/week.   [name], Glennis Brink 12/19/2021 12:50 PM

## 2021-12-19 NOTE — [facility] Group Notes (Signed)
Did not attend evening wrap up group.

## 2021-12-19 NOTE — Progress Notes (Addendum)
[facility] MD Progress Note  12/19/2021 2:02 PM Paula Dickson  MRN:  [id number] Subjective:  Paula Dickson is a 56 year old female with a longstanding psychiatric history of schizoaffective disorder- bipolar type complicated by medication noncompliance and previous suicide attempt via OD who was admitted voluntarily from [facility] for bizarre behaviors and nonsensical speech after medication noncompliance. While in the ED, she was given Geodon x1 on 12/27, Ativan x1 when refusing meds and unable to de-escalate on 12/28.  Case was discussed in the multidisciplinary team. MAR was reviewed and patient was not compliant with medications.  She has required IM antipsychotics on her forced medications regimen.     Psychiatric Team made the following recommendations yesterday:  FORCED MED ORDER Started 1/5  - 1/12!!!! Second FORCED MED ORDER 1/13-1/20!!! THIRD FORCED MED ORDER 1/20- 1/27!!!!! -Haldol 5 mg TID  or 5 mg IM if patient refuses p.o. - Zyprexa 5mg  BID PO or IM if patient refuses MUST BE 1+ HR from Ativan or Haldol administration   - Continue Cogentin 1mg  PO BID or IM if patient refuses  for parkinsonian symptoms (continue Miralax qd and watch for constipation) - Discontinued Restoril 12/17/21 due to ineffectiveness - Continue Haldol 5mg  qam and 10mg  at supper with forced IM backup if refuses (consolidated to bid to reduce number of daily injections and help with compliance) - Continue Ativan 1mg  q6h PRN -Continue Depakote ER 750mg   -(1/9) VA level: 117, CBC-WNL, hepatic function panel-AST 12 , follow-up repeat Depakote level (14) , CBC (WNL), and AST 21 and ALT 14 - will need repeat VPA level once she is consistently taking medication  - Continue Zyprexa 10mg  once a day with IM backup if refuses (consolidated to qd to reduce number of daily injections and help with compliance) - Continue Amantadine 100mg  every day, for EPS.  - will watch for worsening psychosis with restart of medication -Continue  Aricept 5mg  QHS for cognitive impairment, patient repsoding well, consider increase to BID in a 3-4 days EKG- QTC 437 (1/15) -Discontinued Clozapine 12.5mg  qhs due to non compliance (12/18/21) - can attempt to resume if she consistently starts taking medication  On assessment today patient responds pleasantly to new attending provider.  Patient endorses that on occasion she feels that resident provider does not understand her but welcomes both to her room for assessment.  Patient endorses that she is sleeping and eating well.  Patient reports that she is going to groups when she can.  Patient denies SI, HI and AVH.  Patient reports that she saw visions prior to her hospitalization.  Objectively patient appeared to be excited to have a new attending provider today.  Patient was noted to speak mostly towards attending provider.  Patient continues to be irritated when staff attempt to cut her off as she is very disorganized and tangential.  Patient appears less goal oriented today and her discussion than this provider noted last Friday.    Principal Problem: Schizoaffective disorder, bipolar type ([place]) Diagnosis: Principal Problem:   Schizoaffective disorder, bipolar type ([place]) Active Problems:   Hyperthyroidism   Benign essential HTN   AKI (acute kidney injury) ([place])   Cognitive dysfunction in chronic schizophrenia ([place])  Total Time spent with patient: 20 minutes  Past Psychiatric History: See H&P  Past Medical History:  Past Medical History:  Diagnosis Date   Bipolar affective disorder ([place])    Bipolar disorder ([place])    History of arthritis    History of chicken pox    History  of depression    History of genital warts    history of heart murmur    History of high blood pressure    History of thyroid disease    History of UTI    Hypertension    Low TSH level 07/13/2017   Schizophrenia ([place])     Past Surgical History:  Procedure Laterality Date   ABLATION ON ENDOMETRIOSIS      CYST REMOVAL NECK     around 11 years ago /benign   MULTIPLE TOOTH EXTRACTIONS     Family History:  Family History  Problem Relation Age of Onset   Arthritis Father    Hyperlipidemia Father    High blood pressure Father    Diabetes Sister    Diabetes Mother    Diabetes Brother    Mental illness Brother    Alcohol abuse Paternal Uncle    Alcohol abuse Paternal Grandfather    Breast cancer Maternal Aunt    Breast cancer Paternal Aunt    High blood pressure Sister    Mental illness Other        runs in family   Family Psychiatric  History: See H&P Social History:  Social History   Substance and Sexual Activity  Alcohol Use Not Currently     Social History   Substance and Sexual Activity  Drug Use Not Currently    Social History   Socioeconomic History   Marital status: Single    Spouse name: Not on file   Number of children: Not on file   Years of education: Not on file   Highest education level: Patient refused  Occupational History   Not on file  Tobacco Use   Smoking status: Never   Smokeless tobacco: Never  Vaping Use   Vaping Use: Never used  Substance and Sexual Activity   Alcohol use: Not Currently   Drug use: Not Currently   Sexual activity: Not Currently  Other Topics Concern   Not on file  Social History Narrative   ** Merged History Encounter **       Social Determinants of Health   Financial Resource Strain: Not on file  Food Insecurity: Not on file  Transportation Needs: Not on file  Physical Activity: Not on file  Stress: Not on file  Social Connections: Not on file   Additional Social History:                         Sleep: Fair  Appetite:  Good  Current Medications: Current Facility-Administered Medications  Medication Dose Route Frequency Provider Last Rate Last Admin   acetaminophen (TYLENOL) tablet 650 mg  650 mg Oral Q6H PRN Ethelene Hal, NP   650 mg at 11/27/21 2347   alum & mag hydroxide-simeth  (MAALOX/MYLANTA) 200-200-20 MG/5ML suspension 30 mL  30 mL Oral Q4H PRN Ethelene Hal, NP       amantadine (SYMMETREL) capsule 100 mg  100 mg Oral Daily Nkwenti, Doris, NP   100 mg at 12/18/21 0729   benztropine (COGENTIN) tablet 1 mg  1 mg Oral BID Nicholes Rough, NP   1 mg at 12/18/21 1738   Or   benztropine mesylate (COGENTIN) injection 1 mg  1 mg Intramuscular BID Nicholes Rough, NP   1 mg at 12/19/21 0958   clonazePAM (KLONOPIN) tablet 1 mg  1 mg Oral BID Nicholes Rough, NP   1 mg at 12/18/21 1743   diclofenac Sodium (VOLTAREN) 1 %  topical gel 2 g  2 g Topical QID Damita Dunnings B, MD   2 g at 12/17/21 0802   diphenhydrAMINE (BENADRYL) injection 50 mg  50 mg Intramuscular Q6H PRN Massengill, Ovid Curd, MD   50 mg at 12/13/21 2250   divalproex (DEPAKOTE ER) 24 hr tablet 750 mg  750 mg Oral Q supper Damita Dunnings B, MD   750 mg at 12/18/21 1739   donepezil (ARICEPT) tablet 5 mg  5 mg Oral Q supper Damita Dunnings B, MD   5 mg at 12/18/21 1738   haloperidol (HALDOL) tablet 10 mg  10 mg Oral QHS Nelda Marseille, Amy E, MD   10 mg at 12/18/21 1741   Or   haloperidol lactate (HALDOL) injection 10 mg  10 mg Intramuscular QHS Singleton, Amy E, MD       haloperidol (HALDOL) tablet 5 mg  5 mg Oral Q6H PRN Hill, Jackie Plum, MD       And   LORazepam (ATIVAN) tablet 1 mg  1 mg Oral Q6H PRN Hill, Jackie Plum, MD       haloperidol (HALDOL) tablet 5 mg  5 mg Oral Daily Nelda Marseille, Amy E, MD   5 mg at 12/18/21 2542   Or   haloperidol lactate (HALDOL) injection 5 mg  5 mg Intramuscular Daily Nelda Marseille, Amy E, MD   5 mg at 12/19/21 0957   haloperidol lactate (HALDOL) injection 5 mg  5 mg Intramuscular Q6H PRN Maida Sale, MD   5 mg at 12/11/21 2123   LORazepam (ATIVAN) injection 1 mg  1 mg Intramuscular Q6H PRN Viann Fish E, MD   1 mg at 12/14/21 1909   melatonin tablet 10 mg  10 mg Oral QHS Hill, Jackie Plum, MD   10 mg at 12/18/21 2112   methimazole (TAPAZOLE) tablet 5 mg  5 mg Oral  BID Massengill, Ovid Curd, MD   5 mg at 12/18/21 1738   OLANZapine zydis (ZYPREXA) disintegrating tablet 10 mg  10 mg Oral Daily Nelda Marseille, Amy E, MD   10 mg at 12/18/21 1738   Or   OLANZapine (ZYPREXA) injection 10 mg  10 mg Intramuscular Daily Nelda Marseille, Amy E, MD       pantoprazole (PROTONIX) EC tablet 20 mg  20 mg Oral Daily Rosezetta Schlatter, MD   20 mg at 12/18/21 0728   polyethylene glycol (MIRALAX / GLYCOLAX) packet 17 g  17 g Oral Daily Damita Dunnings B, MD   17 g at 12/19/21 1002   propranolol ER (INDERAL LA) 24 hr capsule 60 mg  60 mg Oral Daily Massengill, Ovid Curd, MD   60 mg at 12/18/21 7062   white petrolatum (VASELINE) gel   Topical PRN Rosezetta Schlatter, MD   Given at 12/04/21 1516   zolpidem (AMBIEN) tablet 10 mg  10 mg Oral QHS Harlow Asa, MD   10 mg at 12/18/21 2112    Lab Results: No results found for this or any previous visit (from the past 48 hour(s)).  Blood Alcohol level:  Lab Results  Component Value Date   ETH <10 11/22/2021   ETH <10 37/62/8315    Metabolic Disorder Labs: Lab Results  Component Value Date   HGBA1C 6.1 (H) 11/24/2021   MPG 128.37 11/24/2021   MPG 119.76 03/28/2021   Lab Results  Component Value Date   PROLACTIN 11.0 03/14/2019   Lab Results  Component Value Date   CHOL 127 11/24/2021   TRIG 161 (H) 11/24/2021   HDL 39 (L)  11/24/2021   CHOLHDL 3.3 11/24/2021   VLDL 32 11/24/2021   LDLCALC 56 11/24/2021   LDLCALC 95 10/26/2020    Physical Findings: AIMS: Facial and Oral Movements Muscles of Facial Expression: None, normal Lips and Perioral Area: None, normal Jaw: None, normal Tongue: None, normal,Extremity Movements Upper (arms, wrists, hands, fingers): Mild Lower (legs, knees, ankles, toes): None, normal, Trunk Movements Neck, shoulders, hips: None, normal, Overall Severity Severity of abnormal movements (highest score from questions above): None, normal Incapacitation due to abnormal movements: None, normal Patient's  awareness of abnormal movements (rate only patient's report): No Awareness, Dental Status Current problems with teeth and/or dentures?: No Does patient usually wear dentures?: No  CIWA:    COWS:     Musculoskeletal: Strength & Muscle Tone: within normal limits Gait & Station: normal Patient leans: N/A  Psychiatric Specialty Exam:  Presentation  General Appearance: Casual (wrapped in her blanket)  Eye Contact:Fair  Speech:Clear and Coherent; Pressured  Speech Volume:Normal  Handedness:Right   Mood and Affect  Mood:Irritable  Affect:Congruent   Thought Process  Thought Processes:Disorganized  Descriptions of Associations:Tangential  Orientation:Full (Time, Place and Person)  Thought Content:Illogical; Tangential  History of Schizophrenia/Schizoaffective disorder:Yes  Duration of Psychotic Symptoms:Greater than six months  Hallucinations:Hallucinations: None  Ideas of Reference:Percusatory  Suicidal Thoughts:Suicidal Thoughts: No  Homicidal Thoughts:Homicidal Thoughts: No   Sensorium  Memory:Immediate Fair; Recent Poor  Judgment:Impaired  Insight:None   Executive Functions  Concentration:Poor  Attention Span:Poor  Recall:Poor  Fund of Knowledge:Poor  Language:Fair   Psychomotor Activity  Psychomotor Activity:Psychomotor Activity: Normal   Assets  Assets:Resilience   Sleep  Sleep:Sleep: Fair (6 hours \\broken  up)    Physical Exam: Physical Exam HENT:     Head: Normocephalic and atraumatic.  Pulmonary:     Effort: Pulmonary effort is normal.  Neurological:     Mental Status: She is alert.   Review of Systems  Cardiovascular:  Positive for chest pain.  Musculoskeletal:  Positive for joint pain and myalgias.  Psychiatric/Behavioral:  Negative for hallucinations and suicidal ideas.   Blood pressure 128/79, pulse 87, temperature 97.9 F (36.6 C), temperature source Oral, resp. rate 18, height 5' (1.524 m), weight 76.7 kg, SpO2  100 %. Body mass index is 33.01 kg/m.   Treatment Plan Summary: Daily contact with patient to assess and evaluate symptoms and progress in treatment and Medication management Paula Dickson is a 56 yo patient w/ PPH of schizophrenia.  On assessment today patient appears disorganized and tangential and more pressured and provider loss on Friday.  Patient continues to be irritable with staff on the intent to assist patient or have patient answer certain questions.  Patient is still not compliant with her medications and is requiring IM at times in order to ensure that she is receiving her medications.  Patient does appear to have had some good response to Ambien as she was noted to have a total of 6 hours of sleep.  Unfortunately patient is endorsing chest pain and numerous body aches that she has mentioned throughout her hospitalization.  We will get EKG, and continue to monitor patient for suprapubic pain due to increasing anticholinergic medications.  Patient tremor appears to be improved from description of her weekend.  As W reaching out to [facility], patient continues to require complex medication management.   Schizoaffective Disorder- Bipolar type: IVC first eval completed 12-02-21. IVC served to pt 12-02-21. 24 hour eval completed 12-02-21.   FORCED MED ORDER Started 1/5  - 1/12!!!! Second FORCED MED ORDER  1/13-1/20!!! -Haldol 5 mg TID  or 5 mg IM if patient refuses p.o. - Zyprexa 5mg  BID PO or IM if patient refuses MUST BE 1+ HR from Ativan or Haldol administration   - Continue Cogentin 1mg  PO BID or IM if patient refuses  for parkinsonian symptoms (continue Miralax qd and watch for constipation) - Continue Haldol 5mg  qam and 10mg  at supper with forced IM backup if refuses (consolidated to bid to reduce number of daily injections and help with compliance) - Continue Ativan 1mg  q6h PRN -Continue Depakote ER 750mg   -(1/9) VA level: 117, CBC-WNL, hepatic function panel-AST 12 , follow-up repeat Depakote level  (14) , CBC (WNL), and AST 21 and ALT 14 - will need repeat VPA level once she is consistently taking medication  - Continue Zyprexa 10mg  once a day with IM backup if refuses (consolidated to qd to reduce number of daily injections and help with compliance) - Continue Amantadine 100mg  every day, for EPS.  - will watch for worsening psychosis with restart of medication -Continue Aricept 5mg  QHS for cognitive impairment, patient repsoding well, consider increase to BID in a 3-4 days EKG- QTC 437 (1/15) -Discontinued Clozapine 12.5mg  qhs due to non compliance (12/18/21) - can attempt to resume if she consistently starts taking o medication -Repeat EKG   HTN   Tachycardia: -Continue Propanolol ER 60 mg once daily  -Continue to hold Losartan 25 mg daily - Creatinine 1.42> 1.60>1.29>1.29>1.52   Anxiety & Restlessness -Continue clonazepam 1 mg BID   AKI- improving, stable: -Continuing to push PO fluids -Encourage PO fluid intake: Gatorade and H2O   Insomnia Patient gait appears fairly stable and assessment. Continue Ambien 10 mg nightly (started on 1/21, increased to 10 mg 1/22) Continue Melatonin 10mg  qhs   Hyperthyroidism: Spoke with hospitalist (1/3)  -Continue methimazole at 5 mg bid  -TSH (12/29)- 6.847>>> 12.328 (1/3)>>>6.397 (12-02-21) -Free T4 0.84 wnl (12-02-21)  -Follow up with PCP for further management   Constipation - Continue Miralax daily - KUB, noted distended bladder and moderate stool burden - Continue Aricept   Other PRNs:  -Continue Protonix 20 mg daily -Continue PRN's: Tylenol, Maalox, Atarax, Milk of Magnesia, Trazodone   Toe pain - Volatren gel QID    PGY-2 Freida Busman, MD 12/19/2021, 2:02 PM

## 2021-12-19 NOTE — BHH Group Notes (Signed)
Adult Psychoeducational Group Note  Date:  12/19/2021 Time:  9:12 AM  Group Topic/Focus:  Goals Group:   The focus of this group is to help patients establish daily goals to achieve during treatment and discuss how the patient can incorporate goal setting into their daily lives to aide in recovery.  Participation Level:  Active  Participation Quality:  Intrusive  Affect:  Excited  Cognitive:  Disorganized and Delusional  Insight: Lacking  Engagement in Group:  Off Topic  Modes of Intervention:  Clarification, Discussion, and Reality Testing  Additional Comments:  Pt said that her goal was to "get outta here "and then rambled on about getting her money  because she has lifetime rights to the house and her sister is trying to move into the house and on and on.   Dub Mikes 12/19/2021, 9:12 AM

## 2021-12-19 NOTE — Group Note (Signed)
LCSW Group Therapy Note   Group Date: 12/19/2021 Start Time: 1300 End Time: 1400    Type of Therapy:  Group Therapy: Value Clarification      Participation Level: Active   Description of Group:  In this group, patients will learn to explore, identify, and clarify the values that influence their decisions and behavior and encourages them to build on their inner resources and strengths. This group will be clarifying, exporational and eductional, with patients participating in identifying their own values, identifying others values, and identifying societal values and their impact on their lives.      Therapeutic Goals: Patient will learn to identify values. Patient will learn to identify others values. Patient will learn ways to use their values in decisions they make in their daily lives. Patient will receive support and feedback from others     Therapeutic Modalities:  Acceptance and Commitment Therapy      Summary of Progress/Problems: Patient was disruptive during group, monopolizing group time and often off topic. Patient became irritated if redirected.     Darletta Moll MSW, LCSW Clincal Social Worker  [employer]

## 2021-12-19 NOTE — Progress Notes (Signed)
Adult Psychoeducational Group Note  Date:  12/19/2021 Time:  8:51 PM  Group Topic/Focus:  Wrap-Up Group:   The focus of this group is to help patients review their daily goal of treatment and discuss progress on daily workbooks.  Participation Level:  Did Not Attend  Participation Quality:   Did Not Attend  Affect:   Did Not Attend  Cognitive:   Did Not Attend  Insight: None  Engagement in Group:   Did Not Attend  Modes of Intervention:   Did Not Attend  Additional Comments:  Pt was encouraged to attend wrap up group but did not attend.  [name] 12/19/2021, 8:51 PM

## 2021-12-19 NOTE — Progress Notes (Signed)
°   12/19/21 1100  Psych Admission Type (Psych Patients Only)  Admission Status Involuntary  Psychosocial Assessment  Patient Complaints Worrying  Eye Contact Fair  Facial Expression Animated;Wide-eyed  Affect Silly;Labile;Preoccupied  Speech Incoherent;Pressured  Interaction Assertive;Childlike;Intrusive  Motor Activity Shuffling;Restless;Hyperactive  Appearance/Hygiene Unremarkable  Behavior Characteristics Unwilling to participate  Mood Labile  Thought Process  Coherency Disorganized  Content Preoccupation;Paranoia  Delusions Paranoid  Perception Hallucinations  Hallucination Auditory;Visual  Judgment Poor  Confusion Mild  Danger to Self  Current suicidal ideation? Denies  Danger to Others  Danger to Others None reported or observed   Dar Note: Patient still disorganized, delusional with tangential speech and thought process.  Refused oral medications, IM Haldol and Cogentin given per forced medication protocol.  Routine safety checks maintained.  Patient is safe on and off the unit.

## 2021-12-19 NOTE — BHH Counselor (Signed)
Pt remains on the [facility] wait list.     Darletta Moll MSW, [place] Worker  [facility]

## 2021-12-20 MED ORDER — CLONIDINE HCL 0.1 MG PO TABS
0.1000 mg | ORAL_TABLET | Freq: Once | ORAL | Status: AC
  Administered 2021-12-20: 18:00:00 0.1 mg via ORAL
  Filled 2021-12-20 (×2): qty 1

## 2021-12-20 NOTE — BHH Group Notes (Signed)
Spirituality group facilitated by Kathrynn Humble, [place].   Group Description: Group focused on topic of hope. Patients participated in facilitated discussion around topic, connecting with one another around experiences and definitions for hope. Group members engaged with visual explorer photos, reflecting on what hope looks like for them today. Group engaged in discussion around how their definitions of hope are present today in hospital.   Modalities: Psycho-social ed, [place]lerian, Narrative, MI   Patient Progress: Patient entered group late.  She was carrying a Bible and began to talk about "the Truth."  This was distressing to another patient.  Patient became distressed and agitated. Continued to be agitated for the duration of group.  [address] [place] Pager, [phone]

## 2021-12-20 NOTE — Progress Notes (Addendum)
D:  Paula Dickson has been very agitated, argumentative and intrusive this evening.  She continues to refuse taking various PO medications.  She refused PO haldol stating "I don't take that, the doctors don't know anything."  Numerous attempts tried to encourage her to take PO without success.  She allowed RN to give IM haldol but was highly agitated and verbally aggressive.  She did come up later to take her sleep medications  but continually complained that she isn't supposed to take haldol and "I'm going to get you fired.  I hope you have somewhere else to go."   A:  1:1 with RN for support and encouragement.  Medications given as ordered.  Q 15 minute checks maintained for safety.  Encouraged participation in group and unit activities.   R:  She is currently resting with her eyes closed and appears to be asleep.  She remains safe on the unit.  We will continue to monitor the progress towards her goals.

## 2021-12-20 NOTE — Progress Notes (Signed)
°   12/20/21 2247  Psych Admission Type (Psych Patients Only)  Admission Status Involuntary  Psychosocial Assessment  Patient Complaints Anxiety;Irritability  Eye Contact Glaring  Facial Expression Animated;Wide-eyed  Affect Irritable;Labile;Preoccupied;Angry  Speech Incoherent;Rapid;Pressured;Tangential  Interaction Hostile;Intrusive  Motor Activity Restless;Hyperactive;Pacing  Appearance/Hygiene Layered clothes  Behavior Characteristics Appropriate to situation  Mood Irritable  Thought Process  Coherency Concrete thinking;Disorganized;Tangential  Content Blaming others;Delusions;Preoccupation;Paranoia  Delusions Paranoid  Perception Hallucinations  Hallucination Auditory;Visual  Judgment Poor  Confusion Mild  Danger to Self  Current suicidal ideation? Denies  Danger to Others  Danger to Others None reported or observed

## 2021-12-20 NOTE — Group Note (Signed)
Recreation Therapy Group Note   Group Topic:Health and Wellness  Group Date: 12/20/2021 Start Time: 1000 End Time: 1030 Facilitators: [name], LRT,CTRS Location: [address] Dayroom   Goal Area(s) Addresses:  Patient will verbalize benefit of exercise during group session. Patient will verbalize an exercise that can be completed in their hospital room during admission. Patient will identify an exercise that can be completed post d/c. Patient will acknowledge benefits of exercise when used as a coping mechanism.   Group Description:  Dancing.  LRT talked with patients about the importance of moving the body, explaining it doesn't always have to be regular exercise.  LRT also explained the purpose of the activity today is was the same as if doing an aerobic exercise.  Getting the heart rate up by moving and stretching the muscles was the most important thing.  Patients were to follow along with the music and to decompress, release any tension and calm the mind.   Affect/Mood: N/A   Participation Level: Did not attend    Clinical Observations/Individualized Feedback:     Plan: Continue to engage patient in RT group sessions 2-3x/week.   [name], LRT,CTRS 12/20/2021 11:24 AM

## 2021-12-20 NOTE — Progress Notes (Signed)
[facility] Dickson Progress Note  12/20/2021 5:07 PM Paula Dickson  MRN:  [id number]  Subjective:  Paula Dickson reports: "I'm better since I made a phone call. I can get money from my life insurance. I have to sign the form. You get me?"  Daily Notes: 12/20/2021: Pt with anxious mood, affect is congruent congruent affect.  Attention to personal hygiene and grooming is fair, eye contact is good, speech is garbled, pressured & incoherent. Thought contents are disorganized and illogical, pt continues to have flight of ideas, and continues to be very tangential in her response to questions. Pt continues to jump from topic to topic when answering questions with loose associations. Pt continues to have delusional thoughts. Pt however denies SI/HI/AVH, ideas of reference, and denies paranoia.  Pt reports a good appetite and reports a good sleep quality last night, but as per nursing documentation, she slept a total of 3.75 hrs last night.  Pt observed to have tremors to Dickson/l arms an ambulates with a shuffling gait. Symmetrel 100 mg daily was reordered for tremors yesterday (1/21), Cogentin increased to 1 mg BID on 1/21 as well. Pt is on Ambien to 10 mg nightly to help with insomnia, and on Clonazepam 1 mg BID for anxiety. Pt denies being in any physical pain. As per the [facility], pt refused her Cogentin, Amantadine,  & Klonopin today.   Current BP is 147/97, HR-79. One time dose of Clonidine 0.1 mg ordered for BP control. Last Depakote level was subtherapeutic at 14 (on 1/13). Pt has been refusing this medication, which contributes to the low level.   Reason For Admission: Paula Dickson is a 56 year old female with a longstanding psychiatric history of schizoaffective disorder- bipolar type complicated by medication noncompliance and previous suicide attempt via OD who was admitted voluntarily from [facility] for bizarre behaviors and nonsensical speech after medication noncompliance. While in the ED, she was given Geodon x1 on 12/27,  Ativan x1 when refusing meds and unable to de-escalate on 12/28.  Principal Problem: Schizoaffective disorder, bipolar type (Paula Dickson) Diagnosis: Principal Problem:   Schizoaffective disorder, bipolar type (Paula Dickson) Active Problems:   Hyperthyroidism   Benign essential HTN   AKI (acute kidney injury) (Paula Dickson)   Cognitive dysfunction in chronic schizophrenia (Paula Dickson)  Total Time spent with patient: 20 minutes  Past Psychiatric History: See H&P  Past Medical History:  Past Medical History:  Diagnosis Date   Bipolar affective disorder (Paula Dickson)    Bipolar disorder (Paula Dickson)    History of arthritis    History of chicken pox    History of depression    History of genital warts    history of heart murmur    History of high blood pressure    History of thyroid disease    History of UTI    Hypertension    Low TSH level 07/13/2017   Schizophrenia (Paula Dickson)     Past Surgical History:  Procedure Laterality Date   ABLATION ON ENDOMETRIOSIS     CYST REMOVAL NECK     around 11 years ago /benign   MULTIPLE TOOTH EXTRACTIONS     Family History:  Family History  Problem Relation Age of Onset   Arthritis Father    Hyperlipidemia Father    High blood pressure Father    Diabetes Sister    Diabetes Mother    Diabetes Brother    Mental illness Brother    Alcohol abuse Paternal Uncle    Alcohol abuse Paternal Grandfather    Breast cancer  Maternal Aunt    Breast cancer Paternal Aunt    High blood pressure Sister    Mental illness Other        runs in family   Family Psychiatric  History:  See H&P Social History:  Social History   Substance and Sexual Activity  Alcohol Use Not Currently     Social History   Substance and Sexual Activity  Drug Use Not Currently    Social History   Socioeconomic History   Marital status: Single    Spouse name: Not on file   Number of children: Not on file   Years of education: Not on file   Highest education level: Patient refused  Occupational History   Not  on file  Tobacco Use   Smoking status: Never   Smokeless tobacco: Never  Vaping Use   Vaping Use: Never used  Substance and Sexual Activity   Alcohol use: Not Currently   Drug use: Not Currently   Sexual activity: Not Currently  Other Topics Concern   Not on file  Social History Narrative   ** Merged History Encounter **       Social Determinants of Health   Financial Resource Strain: Not on file  Food Insecurity: Not on file  Transportation Needs: Not on file  Physical Activity: Not on file  Stress: Not on file  Social Connections: Not on file   Additional Social History:      Sleep: Poor  Appetite:  Good  Current Medications: Current Facility-Administered Medications  Medication Dose Route Frequency Provider Last Rate Last Admin   acetaminophen (TYLENOL) tablet 650 mg  650 mg Oral Q6H PRN Paula Hal, NP   650 mg at 11/27/21 2347   alum & mag hydroxide-simeth (MAALOX/MYLANTA) 200-200-20 MG/5ML suspension 30 mL  30 mL Oral Q4H PRN Paula Hal, NP       amantadine (SYMMETREL) capsule 100 mg  100 mg Oral Daily Paula Mase, NP   100 mg at 12/18/21 0729   benztropine (COGENTIN) tablet 1 mg  1 mg Oral BID Paula Rough, NP   1 mg at 12/19/21 1814   Or   benztropine mesylate (COGENTIN) injection 1 mg  1 mg Intramuscular BID Paula Rough, NP   1 mg at 12/19/21 0630   clonazePAM (KLONOPIN) tablet 1 mg  1 mg Oral BID Paula Rough, NP   1 mg at 12/18/21 1743   cloNIDine (CATAPRES) tablet 0.1 mg  0.1 mg Oral Once Paula Rough, NP       diclofenac Sodium (VOLTAREN) 1 % topical gel 2 g  2 g Topical QID Paula Dickson   2 g at 12/17/21 0802   diphenhydrAMINE (BENADRYL) injection 50 mg  50 mg Intramuscular Q6H PRN Paula Dickson, Paula Curd, Dickson   50 mg at 12/13/21 2250   divalproex (DEPAKOTE ER) 24 hr tablet 750 mg  750 mg Oral Q supper Paula Dickson   750 mg at 12/18/21 1739   donepezil (ARICEPT) tablet 5 mg  5 mg Oral Q supper Paula Dickson   5  mg at 12/18/21 1738   haloperidol (HALDOL) tablet 10 mg  10 mg Oral QHS Paula Dickson   10 mg at 12/18/21 1741   Or   haloperidol lactate (HALDOL) injection 10 mg  10 mg Intramuscular QHS Paula Dickson   10 mg at 12/19/21 2032   haloperidol (HALDOL) tablet 5 mg  5 mg Oral Q6H PRN Magdalen Spatz  Leigh, Dickson       And   LORazepam (ATIVAN) tablet 1 mg  1 mg Oral Q6H PRN Hill, Jackie Plum, Dickson       haloperidol (HALDOL) tablet 5 mg  5 mg Oral Daily Nelda Marseille, Amy E, Dickson   5 mg at 12/20/21 0630   Or   haloperidol lactate (HALDOL) injection 5 mg  5 mg Intramuscular Daily Nelda Marseille, Amy E, Dickson   5 mg at 12/19/21 0957   haloperidol lactate (HALDOL) injection 5 mg  5 mg Intramuscular Q6H PRN Maida Sale, Dickson   5 mg at 12/11/21 2123   LORazepam (ATIVAN) injection 1 mg  1 mg Intramuscular Q6H PRN Paula Dickson   1 mg at 12/19/21 2031   melatonin tablet 10 mg  10 mg Oral QHS Hill, Jackie Plum, Dickson   10 mg at 12/19/21 2254   methimazole (TAPAZOLE) tablet 5 mg  5 mg Oral BID Paula Dickson, Paula Curd, Dickson   5 mg at 12/20/21 0831   OLANZapine zydis (ZYPREXA) disintegrating tablet 10 mg  10 mg Oral Daily Paula Dickson   10 mg at 12/19/21 1816   Or   OLANZapine (ZYPREXA) injection 10 mg  10 mg Intramuscular Daily Nelda Marseille, Amy E, Dickson       pantoprazole (PROTONIX) EC tablet 20 mg  20 mg Oral Daily Rosezetta Schlatter, Dickson   20 mg at 12/20/21 0831   polyethylene glycol (MIRALAX / GLYCOLAX) packet 17 g  17 g Oral Daily Paula Dickson   17 g at 12/20/21 0834   propranolol ER (INDERAL LA) 24 hr capsule 60 mg  60 mg Oral Daily Paula Dickson, Paula Curd, Dickson   60 mg at 12/20/21 1601   white petrolatum (VASELINE) gel   Topical PRN Rosezetta Schlatter, Dickson   Given at 12/04/21 1516   zolpidem (AMBIEN) tablet 10 mg  10 mg Oral QHS Harlow Asa, Dickson   10 mg at 12/19/21 2254    Lab Results:  No results found for this or any previous visit (from the past 64 hour(s)).   Blood Alcohol level:  Lab  Results  Component Value Date   ETH <10 11/22/2021   ETH <10 09/32/3557    Metabolic Disorder Labs: Lab Results  Component Value Date   HGBA1C 6.1 (H) 11/24/2021   MPG 128.37 11/24/2021   MPG 119.76 03/28/2021   Lab Results  Component Value Date   PROLACTIN 11.0 03/14/2019   Lab Results  Component Value Date   CHOL 127 11/24/2021   TRIG 161 (H) 11/24/2021   HDL 39 (L) 11/24/2021   CHOLHDL 3.3 11/24/2021   VLDL 32 11/24/2021   LDLCALC 56 11/24/2021   LDLCALC 95 10/26/2020   Physical Findings: AIMS: Facial and Oral Movements Muscles of Facial Expression: None, normal Lips and Perioral Area: None, normal Jaw: None, normal Tongue: None, normal,Extremity Movements Upper (arms, wrists, hands, fingers): Mild Lower (legs, knees, ankles, toes): None, normal, Trunk Movements Neck, shoulders, hips: None, normal, Overall Severity Severity of abnormal movements (highest score from questions above): None, normal Incapacitation due to abnormal movements: None, normal Patient's awareness of abnormal movements (rate only patient's report): No Awareness, Dental Status Current problems with teeth and/or dentures?: No Does patient usually wear dentures?: No  CIWA:  Paula Dickson COWS:  Paula Dickson AIMS-2  Musculoskeletal: Strength & Muscle Tone: within normal limits Gait & Station: steady but shuffling gait Patient leans: Paula Dickson  Psychiatric Specialty Exam:  Presentation  General Appearance: Fairly groomed, obese  Eye Contact:Fair  Speech:rambling, coherent but pressured at times  Speech Volume:Normal  Handedness:Right  Mood and Affect  Mood:Anxious  Affect:Congruent  Thought Process  Thought Processes:Disorganized  Orientation:uncooperative for questioning  Thought Content: Illogical, flight of ideas  History of Schizophrenia/Schizoaffective disorder:Yes  Duration of Psychotic Symptoms:Greater than six months  Hallucinations:Denied  Ideas of Reference:Denied  Suicidal  Thoughts:Suicidal Thoughts: No  Homicidal Thoughts:Homicidal Thoughts: No  Sensorium  Memory:Immediate - Fair  Judgment:Poor  Insight:Poor  Executive Functions  Concentration:Fair  Attention Span:Fair  Bradley  Language:Poor  Psychomotor Activity  Psychomotor Activity: shuffling gait and bilateral pill rolling tremor at rest c/w parkinsonian symptoms - but improving  Assets  Assets:Social Support  Sleep  Total time unrecorded  Physical Exam HENT:     Head: Normocephalic and atraumatic.     Nose: Nose normal.  Pulmonary:     Effort: Pulmonary effort is normal.  Neurological:     Mental Status: She is alert.   Review of Systems  Constitutional: Negative.   HENT: Negative.    Eyes: Negative.   Respiratory: Negative.    Cardiovascular: Negative.   Gastrointestinal: Negative.   Genitourinary: Negative.   Musculoskeletal: Negative.   Skin: Negative.   Neurological: Negative.   Psychiatric/Behavioral:  Negative for hallucinations and suicidal ideas. The patient has insomnia.   Blood pressure (!) 147/97, pulse 79, temperature 98.1 F (36.7 C), temperature source Oral, resp. rate 18, height 5' (1.524 m), weight 76.7 kg, SpO2 98 %. Body mass index is 33.01 kg/m.  Treatment Plan Summary: Daily contact with patient to assess and evaluate symptoms and progress in treatment and Medication management  Schizoaffective Disorder- Bipolar type: IVC first eval completed 12-02-21. IVC served to pt 12-02-21. 24 hour eval completed 12-02-21.   FORCED MED ORDER Started 1/5  - 1/12!!!! Second FORCED MED ORDER 1/13-1/20!!! -Haldol 5 mg TID  or 5 mg IM if patient refuses p.o. - Zyprexa 5mg  BID PO or IM if patient refuses MUST BE 1+ HR from Ativan or Haldol administration  - Continue Cogentin 1mg  PO BID or IM if patient refuses  for parkinsonian symptoms (continue Miralax qd and watch for constipation) - Discontinued Restoril 12/17/21 due to  ineffectiveness - Continue Haldol 5mg  qam and 10mg  at supper with forced IM backup if refuses (consolidated to bid to reduce number of daily injections and help with compliance) - Continue Ativan 1mg  q6h PRN -Continue Depakote ER 750mg   -(1/9) VA level: 117, CBC-WNL, hepatic function panel-AST 12 , follow-up repeat Depakote level (14) , CBC (WNL), and AST 21 and ALT 14 - will need repeat VPA level once she is consistently taking medication  - Continue Zyprexa 10mg  once a day with IM backup if refuses (consolidated to qd to reduce number of daily injections and help with compliance) - Continue Amantadine 100mg  every day, for EPS.  - will watch for worsening psychosis with restart of medication -Continue Aricept 5mg  QHS for cognitive impairment, patient repsoding well, consider increase to BID in a 3-4 days EKG- QTC 437 (1/15) -Discontinued Clozapine 12.5mg  qhs due to non compliance (12/18/21) - can attempt to resume if she consistently starts taking o medication   HTN   Tachycardia: -Continue Propanolol ER 60 mg once daily  -Give one time dose of Clonidine 0.1 mg for elevated BP - Creatinine 1.42> 1.60>1.29>1.29>1.52   Anxiety & Restlessness -Continue Clonazepam 1 mg BID  AKI- improving, stable: -Continuing to push PO fluids -Encourage PO fluid intake: Gatorade and H2O  Insomnia Continue Ambien to 10 mg nightly (started on 1/21, increased to 10 mg 1/22) Continue Melatonin 10mg  qhs  Hyperthyroidism: Spoke with hospitalist (1/3)  -Continue methimazole at 5 mg bid  -TSH (12/29)- 6.847>>> 12.328 (1/3)>>>6.397 (12-02-21) -Free T4 0.84 wnl (12-02-21)  -Follow up with PCP for further management   Constipation - Continue Miralax daily - KUB, noted distended bladder and moderate stool burden - Continue Aricept   Other PRNs:  -Continue Protonix 20 mg daily -Continue PRN's: Tylenol, Maalox, Atarax, Milk of Magnesia, Trazodone   Toe pain - Volatren gel QID   Paula Rough,  NP 12/20/2021, 5:07 PM  Patient ID: Horald Chestnut Khalid, female   DOB: 02-Aug-1966, 56 y.o.   MRN: [id number]

## 2021-12-20 NOTE — Progress Notes (Signed)
°   12/20/21 1200  Psych Admission Type (Psych Patients Only)  Admission Status Involuntary  Psychosocial Assessment  Patient Complaints Irritability;Worrying  Eye Contact Glaring  Facial Expression Animated;Wide-eyed  Affect Irritable;Labile;Preoccupied;Angry  Speech Incoherent;Rapid;Pressured;Tangential  Interaction Hostile;Intrusive  Motor Activity Restless;Hyperactive;Pacing  Appearance/Hygiene Layered clothes  Behavior Characteristics Unwilling to participate  Mood Labile  Thought Process  Coherency Concrete thinking;Disorganized;Tangential  Content Blaming others;Delusions;Preoccupation;Paranoia  Delusions Paranoid  Perception Hallucinations  Hallucination Auditory;Visual  Judgment Poor  Confusion Mild  Danger to Self  Current suicidal ideation? Denies  Danger to Others  Danger to Others None reported or observed

## 2021-12-20 NOTE — Progress Notes (Signed)
Adult Psychoeducational Group Note  Date:  12/20/2021 Time:  8:40 PM  Group Topic/Focus:  Wrap-Up Group:   The focus of this group is to help patients review their daily goal of treatment and discuss progress on daily workbooks.  Participation Level:  Active  Participation Quality:  Appropriate  Affect:  Anxious and Excited  Cognitive:  Disorganized, Confused, and Delusional  Insight: Lacking and Limited  Engagement in Group:  Lacking, Limited, Off Topic, and Poor  Modes of Intervention:  Discussion  Additional Comments:   Pt stated her goal for today was to focus on her treatment plan. Pt stated she accomplished her goal today. Pt stated she did not talked with her doctor but did talk with her social worker about her care today. Pt rated her overall day a 8 out of 10. Pt stated she is ready for discharge. Writer encourage pt to talk with her doctor about her discharge plan. Pt stated the doctors don't know what there doing but only want her to say here a force medication on her. Writer encourage pt to talk with her doctor about her medication issues. Pt stated she was able to contact her brother, and with a family friend today which improved her overall day. Pt stated she felt better about herself tonight. Pt stated she was able to attend  all meals today . Pt stated she took all medications provided today. Pt stated her appetite was good today. Pt rated her sleep last night was fair. Pt stated the goal tonight was to get some rest. Pt stated she had no physical pain tonight.  Pt admitted to dealing with  visual hallucinations and auditory issues tonight. Pt nurse was updated on situation. Pt denies thoughts of harming herself or others. Pt stated she would alert staff if anything changed  [name] 12/20/2021, 8:40 PM

## 2021-12-21 ENCOUNTER — Encounter (HOSPITAL_COMMUNITY): Payer: Self-pay

## 2021-12-21 NOTE — Group Note (Signed)
Recreation Therapy Group Note   Group Topic:Self-Esteem  Group Date: 12/21/2021 Start Time: 1000 End Time: 1040 Facilitators: [name], LRT,CTRS Location: [address] Dayroom   Goal Area(s) Addresses:  Patient will identify and write at least one positive trait about themself. Patient will acknowledge the benefit of healthy self-esteem. Patient will endorse understanding of ways to increase self-esteem.   Group Description:  LRT began group session with open dialogue asking the patients to define self-esteem and verbally identify positive qualities and traits people may possess. Patients were then instructed to design a personalized license plate, with words and drawings, representing at least 3 positive things about themselves. Pts were encouraged to include favorites, things they are proud of, what they enjoy doing, and dreams for their future. If a patient had a life motto or a meaningful phase that expressed their life values, pt's were asked to incorporate that into their design as well. Patients were given the opportunity to share their completed work with the group.   Affect/Mood: Manic   Participation Level: Engaged and Hyperverbal   Participation Quality: Maximum Cues   Behavior: Disruptive and Hyperverbal   Speech/Thought Process: Coherent, Irrational, and Rational   Insight: Moderate   Judgement: Moderate   Modes of Intervention: Art and Music   Patient Response to Interventions:  Engaged   Education Outcome:  Acknowledges education and In group clarification offered    Clinical Observations/Individualized Feedback: Pt spent most of her time laughing and marking over many things.  LRT told pt many of times to quiet down and focus on the activity.  Pt described my religious things on license plate.  Pt identified things on her license plate such as John 7:86, group [employer] 5:7 because love of their music, peace be still because "people try to catch you unaware".   Pt went off topic and started rambling about her privacy being taken, having cramps and not being taken care of.  Pt was asked what did the saying on her tag ment, pt stated it was how she strayed but was able to come back.      Plan: Continue to engage patient in RT group sessions 2-3x/week.   [name], LRT,CTRS 12/21/2021 11:43 AM

## 2021-12-21 NOTE — Progress Notes (Signed)
Pt continues to be labile on the unit. Pt took PO HS medications    12/21/21 2200  Psych Admission Type (Psych Patients Only)  Admission Status Involuntary  Psychosocial Assessment  Patient Complaints Anxiety;Suspiciousness  Eye Contact Glaring  Facial Expression Animated;Wide-eyed  Affect Irritable;Labile;Preoccupied;Angry  Speech Incoherent;Rapid;Pressured;Tangential  Interaction Hostile;Intrusive  Motor Activity Restless;Hyperactive;Pacing  Appearance/Hygiene Layered clothes  Behavior Characteristics Cooperative;Anxious  Mood Labile  Thought Process  Coherency Concrete thinking;Disorganized;Tangential  Content Blaming others;Delusions;Preoccupation;Paranoia  Delusions Paranoid  Perception Hallucinations  Hallucination Auditory;Visual  Judgment Poor  Confusion Mild  Danger to Self  Current suicidal ideation? Denies  Danger to Others  Danger to Others None reported or observed

## 2021-12-21 NOTE — Progress Notes (Signed)
Patient ID: Paula Dickson, female   DOB: 01/14/1966, 56 y.o.   MRN: [id number]   Pt has been ambulatory on the unit today. Pt attended groups today and actively participated. Pt refused most of her mediations today. Pt has been cooperative and calm. Education, support and encouragement provided, q15 minute safety checks remain in effect. Medications administered per MD orders. No reactions/side effects to medicine noted. Pt remains safe on and off the unit.

## 2021-12-21 NOTE — [facility] Group Notes (Signed)
[place] Group Notes:  (Nursing/MHT/Case Management/Adjunct)  Date:  12/21/2021  Time:  10:33 AM  Type of Therapy:   Orientation/Goals group  Participation Level:  Active  Participation Quality:  Appropriate  Affect:  Appropriate  Cognitive:  Alert, Disorganized, and Confused  Insight:  Improving  Engagement in Group:  Engaged and Improving  Modes of Intervention:  Discussion, Education, and Orientation  Summary of Progress/Problems: Pt goal for today is to take medications that are needed and find out when discharge is.   Paula Dickson 12/21/2021, 10:33 AM

## 2021-12-21 NOTE — Progress Notes (Signed)
Adult Psychoeducational Group Note  Date:  12/21/2021 Time:  11:01 PM  Group Topic/Focus:  Wrap-Up Group:   The focus of this group is to help patients review their daily goal of treatment and discuss progress on daily workbooks.  Participation Level:  Active  Participation Quality:  Appropriate  Affect:  Anxious, Appropriate, and Excited  Cognitive:  Appropriate  Insight: Appropriate  Engagement in Group:  Engaged  Modes of Intervention:  Discussion  Additional Comments:  Pt stated her goal for today was to focus on her treatment plan. Pt stated she accomplished her goal today. Pt stated she talk with her doctor and with her social worker about her care today. Pt rated her overall day a 10. Pt stated she was able to contact her family members and church friends today which improved her overall day. Pt stated she felt better about herself tonight. Pt stated she was able to attend all meals today. Pt stated she took all medications provided today. Pt stated her appetite was pretty good today. Pt rated her sleep last night was good. Pt stated the goal tonight was to get some rest. Pt stated she had some physical pain tonight. Pt stated she had some moderate pain in her left knee. Pt rated the moderate pain in her left knee a 7 on the pain level scale. Pt nurse was updated on the situation.  Pt deny visual hallucinations and auditory issues tonight. Pt denies thoughts of harming herself or others. Pt stated she would alert staff if anything changed.  [name] 12/21/2021, 11:01 PM

## 2021-12-21 NOTE — Progress Notes (Signed)
[facility] MD Progress Note  12/21/2021 3:24 PM Kasey Hansell Shanholtzer  MRN:  [id number] Subjective:   Rafael is a 56 year old female with a longstanding psychiatric history of schizoaffective disorder- bipolar type complicated by medication noncompliance and previous suicide attempt via OD who was admitted voluntarily from [facility] for bizarre behaviors and nonsensical speech after medication noncompliance. While in the ED, she was given Geodon x1 on 12/27, Ativan x1 when refusing meds and unable to de-escalate on 12/28.   Case was discussed in the multidisciplinary team. MAR was reviewed and patient was compliant with p.o. medications with the exception of Aricept and Klonopin.     Psychiatric Team made the following recommendations yesterday:  FORCED MED ORDER Started 1/5  - 1/12!!!! Second FORCED MED ORDER 1/13-1/20!!! THIRD FORCED MED ORDER 1/20- 1/27!!!!! -Haldol 5 mg TID  or 5 mg IM if patient refuses p.o. - Zyprexa 5mg  BID PO or IM if patient refuses MUST BE 1+ HR from Ativan or Haldol administration  - Continue Cogentin 1mg  PO BID or IM if patient refuses  for parkinsonian symptoms (continue Miralax qd and watch for constipation) - Continue Haldol 5mg  qam and 10mg  at supper with forced IM backup if refuses (consolidated to bid to reduce number of daily injections and help with compliance) - Continue Ativan 1mg  q6h PRN -Continue Depakote ER 750mg   - Continue Zyprexa 10mg  once a day with IM backup if refuses (consolidated to qd to reduce number of daily injections and help with compliance) - Continue Amantadine 100mg  every day, for EPS.  - will watch for worsening psychosis with restart of medication -Continue Aricept 5mg  QHS for cognitive impairment, patient repsoding well, consider increase to BID in a 3-4 days EKG- QTC 437 (1/15) -Discontinued Clozapine 12.5mg  qhs due to non compliance (12/18/21) - can attempt to resume if she consistently starts taking PO medication  Subjectively, patient  reports that she is eating and sleeping well.  Patient denies SI, HI and AVH.  Patient endorses that she has "visions" but has not had 1 since has been in the hospital.  Patient goes on to talk about her family and things that she has done in the past.  Patient invited provider into her room for interview and then ended the interview herself when she felt she had seen provider long enough and asked provider to leave.  Objectively patient continues to be disorganized and tangential and difficult to follow.  Patient continues to be upset with staff and providers have to cut her off due to disorganization or in an attempt to answer her questions.  Principal Problem: Schizoaffective disorder, bipolar type ([place]) Diagnosis: Principal Problem:   Schizoaffective disorder, bipolar type ([place]) Active Problems:   Hyperthyroidism   Benign essential HTN   AKI (acute kidney injury) ([place])   Cognitive dysfunction in chronic schizophrenia ([place])  Total Time spent with patient: 15 minutes  Past Psychiatric History: See H&P  Past Medical History:  Past Medical History:  Diagnosis Date   Bipolar affective disorder ([place])    Bipolar disorder ([place])    History of arthritis    History of chicken pox    History of depression    History of genital warts    history of heart murmur    History of high blood pressure    History of thyroid disease    History of UTI    Hypertension    Low TSH level 07/13/2017   Schizophrenia ([place])     Past Surgical History:  Procedure Laterality Date   ABLATION ON ENDOMETRIOSIS     CYST REMOVAL NECK     around 11 years ago /benign   MULTIPLE TOOTH EXTRACTIONS     Family History:  Family History  Problem Relation Age of Onset   Arthritis Father    Hyperlipidemia Father    High blood pressure Father    Diabetes Sister    Diabetes Mother    Diabetes Brother    Mental illness Brother    Alcohol abuse Paternal Uncle    Alcohol abuse Paternal Grandfather    Breast cancer  Maternal Aunt    Breast cancer Paternal Aunt    High blood pressure Sister    Mental illness Other        runs in family   Family Psychiatric  History: See H&P Social History:  Social History   Substance and Sexual Activity  Alcohol Use Not Currently     Social History   Substance and Sexual Activity  Drug Use Not Currently    Social History   Socioeconomic History   Marital status: Single    Spouse name: Not on file   Number of children: Not on file   Years of education: Not on file   Highest education level: Patient refused  Occupational History   Not on file  Tobacco Use   Smoking status: Never   Smokeless tobacco: Never  Vaping Use   Vaping Use: Never used  Substance and Sexual Activity   Alcohol use: Not Currently   Drug use: Not Currently   Sexual activity: Not Currently  Other Topics Concern   Not on file  Social History Narrative   ** Merged History Encounter **       Social Determinants of Health   Financial Resource Strain: Not on file  Food Insecurity: Not on file  Transportation Needs: Not on file  Physical Activity: Not on file  Stress: Not on file  Social Connections: Not on file   Additional Social History:                         Sleep: Fair  Appetite:  Good  Current Medications: Current Facility-Administered Medications  Medication Dose Route Frequency Provider Last Rate Last Admin   acetaminophen (TYLENOL) tablet 650 mg  650 mg Oral Q6H PRN Ethelene Hal, NP   650 mg at 11/27/21 2347   alum & mag hydroxide-simeth (MAALOX/MYLANTA) 200-200-20 MG/5ML suspension 30 mL  30 mL Oral Q4H PRN Ethelene Hal, NP       amantadine (SYMMETREL) capsule 100 mg  100 mg Oral Daily Nkwenti, Doris, NP   100 mg at 12/18/21 0729   benztropine (COGENTIN) tablet 1 mg  1 mg Oral BID Nicholes Rough, NP   1 mg at 12/20/21 1811   Or   benztropine mesylate (COGENTIN) injection 1 mg  1 mg Intramuscular BID Nicholes Rough, NP   1 mg at  12/19/21 2831   clonazePAM (KLONOPIN) tablet 1 mg  1 mg Oral BID Nicholes Rough, NP   1 mg at 12/18/21 1743   diclofenac Sodium (VOLTAREN) 1 % topical gel 2 g  2 g Topical QID Damita Dunnings B, MD   2 g at 12/17/21 0802   diphenhydrAMINE (BENADRYL) injection 50 mg  50 mg Intramuscular Q6H PRN Massengill, Ovid Curd, MD   50 mg at 12/13/21 2250   divalproex (DEPAKOTE ER) 24 hr tablet 750 mg  750 mg Oral Q supper Zyir Gassert,  Mila Palmer, MD   750 mg at 12/20/21 1820   donepezil (ARICEPT) tablet 5 mg  5 mg Oral Q supper Damita Dunnings B, MD   5 mg at 12/18/21 1738   haloperidol (HALDOL) tablet 10 mg  10 mg Oral QHS Viann Fish E, MD   10 mg at 12/20/21 1811   Or   haloperidol lactate (HALDOL) injection 10 mg  10 mg Intramuscular QHS Nelda Marseille, Amy E, MD   10 mg at 12/19/21 2032   haloperidol (HALDOL) tablet 5 mg  5 mg Oral Q6H PRN Maida Sale, MD       And   LORazepam (ATIVAN) tablet 1 mg  1 mg Oral Q6H PRN Maida Sale, MD       haloperidol (HALDOL) tablet 5 mg  5 mg Oral Daily Nelda Marseille, Amy E, MD   5 mg at 12/20/21 6283   Or   haloperidol lactate (HALDOL) injection 5 mg  5 mg Intramuscular Daily Nelda Marseille, Amy E, MD   5 mg at 12/19/21 0957   haloperidol lactate (HALDOL) injection 5 mg  5 mg Intramuscular Q6H PRN Maida Sale, MD   5 mg at 12/11/21 2123   LORazepam (ATIVAN) injection 1 mg  1 mg Intramuscular Q6H PRN Viann Fish E, MD   1 mg at 12/19/21 2031   melatonin tablet 10 mg  10 mg Oral QHS Hill, Jackie Plum, MD   10 mg at 12/20/21 2023   methimazole (TAPAZOLE) tablet 5 mg  5 mg Oral BID Massengill, Ovid Curd, MD   5 mg at 12/21/21 0935   OLANZapine zydis (ZYPREXA) disintegrating tablet 10 mg  10 mg Oral Daily Nelda Marseille, Amy E, MD   10 mg at 12/20/21 1821   Or   OLANZapine (ZYPREXA) injection 10 mg  10 mg Intramuscular Daily Nelda Marseille, Amy E, MD       pantoprazole (PROTONIX) EC tablet 20 mg  20 mg Oral Daily Rosezetta Schlatter, MD   20 mg at 12/21/21 0935   polyethylene  glycol (MIRALAX / GLYCOLAX) packet 17 g  17 g Oral Daily Damita Dunnings B, MD   17 g at 12/20/21 0834   propranolol ER (INDERAL LA) 24 hr capsule 60 mg  60 mg Oral Daily Massengill, Ovid Curd, MD   60 mg at 12/21/21 6629   white petrolatum (VASELINE) gel   Topical PRN Rosezetta Schlatter, MD   Given at 12/04/21 1516   zolpidem (AMBIEN) tablet 10 mg  10 mg Oral QHS Harlow Asa, MD   10 mg at 12/20/21 2023    Lab Results: No results found for this or any previous visit (from the past 42 hour(s)).  Blood Alcohol level:  Lab Results  Component Value Date   ETH <10 11/22/2021   ETH <10 47/65/4650    Metabolic Disorder Labs: Lab Results  Component Value Date   HGBA1C 6.1 (H) 11/24/2021   MPG 128.37 11/24/2021   MPG 119.76 03/28/2021   Lab Results  Component Value Date   PROLACTIN 11.0 03/14/2019   Lab Results  Component Value Date   CHOL 127 11/24/2021   TRIG 161 (H) 11/24/2021   HDL 39 (L) 11/24/2021   CHOLHDL 3.3 11/24/2021   VLDL 32 11/24/2021   LDLCALC 56 11/24/2021   LDLCALC 95 10/26/2020    Physical Findings: AIMS: Facial and Oral Movements Muscles of Facial Expression: None, normal Lips and Perioral Area: None, normal Jaw: None, normal Tongue: None, normal,Extremity Movements Upper (arms, wrists, hands, fingers): Mild Lower (  legs, knees, ankles, toes): None, normal, Trunk Movements Neck, shoulders, hips: None, normal, Overall Severity Severity of abnormal movements (highest score from questions above): None, normal Incapacitation due to abnormal movements: None, normal Patient's awareness of abnormal movements (rate only patient's report): No Awareness, Dental Status Current problems with teeth and/or dentures?: No Does patient usually wear dentures?: No  CIWA:    COWS:     Musculoskeletal: Strength & Muscle Tone: within normal limits Gait & Station: normal Patient leans: N/A  Psychiatric Specialty Exam:  Presentation  General Appearance: Appropriate for  Environment; Casual  Eye Contact:Fleeting  Speech:Clear and Coherent  Speech Volume:Normal  Handedness:Right   Mood and Affect  Mood:Irritable  Affect:Congruent   Thought Process  Thought Processes:Disorganized  Descriptions of Associations:Tangential  Orientation:Full (Time, Place and Person)  Thought Content:Scattered  History of Schizophrenia/Schizoaffective disorder:Yes  Duration of Psychotic Symptoms:Greater than six months  Hallucinations:Hallucinations: None  Ideas of Reference:None  Suicidal Thoughts:Suicidal Thoughts: No  Homicidal Thoughts:Homicidal Thoughts: No   Sensorium  Memory:Immediate Fair  Judgment:Impaired  Insight:Shallow   Executive Functions  Concentration:Poor  Attention Span:Poor  Boundary   Psychomotor Activity  Psychomotor Activity:Psychomotor Activity: Tremor   Assets  Assets:Resilience   Sleep  Sleep:Sleep: Fair    Physical Exam: Physical Exam Constitutional:      Appearance: Normal appearance.  HENT:     Head: Normocephalic and atraumatic.  Pulmonary:     Effort: Pulmonary effort is normal.  Neurological:     Mental Status: She is alert.   Review of Systems  Psychiatric/Behavioral:  Negative for hallucinations, substance abuse and suicidal ideas.   Blood pressure (!) 131/93, pulse 73, temperature 97.8 F (36.6 C), temperature source Oral, resp. rate 18, height 5' (1.524 m), weight 76.7 kg, SpO2 98 %. Body mass index is 33.01 kg/m.   Treatment Plan Summary: Daily contact with patient to assess and evaluate symptoms and progress in treatment and Medication management Grisela is a 56 yo patient w/ PPH of schizophrenia.  Patient continues to appear disorganized and tangential although it is obvious that patient has taken some of her medications as she is more goal oriented in thought.  Patient continues to be irritable with staff but is at least compliant with  p.o. medications and gets along well with other patients on the unit.  Patient is not taking Aricept or her Klonopin however, patient is taking mood stabilizing medications and antipsychotics.  We will continue to monitor.  Social work reports no update from [facility].   - Continue Cogentin 1mg  PO BID or IM if patient refuses  for parkinsonian symptoms (continue Miralax qd and watch for constipation) - Discontinued Restoril 12/17/21 due to ineffectiveness - Continue Haldol 5mg  qam and 10mg  at supper with forced IM backup if refuses (consolidated to bid to reduce number of daily injections and help with compliance) - Continue Ativan 1mg  q6h PRN -Continue Depakote ER 750mg   -(1/9) VA level: 117, CBC-WNL, hepatic function panel-AST 12 , follow-up repeat Depakote level (14) , CBC (WNL), and AST 21 and ALT 14 - will need repeat VPA level once she is consistently taking medication  - Continue Zyprexa 10mg  once a day with IM backup if refuses (consolidated to qd to reduce number of daily injections and help with compliance) - Continue Amantadine 100mg  every day, for EPS.  - will watch for worsening psychosis with restart of medication -Continue Aricept 5mg  QHS for cognitive impairment, patient repsoding well, consider increase to BID in a  3-4 days EKG- QTC 437 (1/15) -Discontinued Clozapine 12.5mg  qhs due to non compliance (12/18/21) - can attempt to resume if she consistently starts taking o medication   HTN   Tachycardia: -Continue Propanolol ER 60 mg once daily  - Creatinine 1.42> 1.60>1.29>1.29>1.52   Anxiety & Restlessness -Continue Clonazepam 1 mg BID   AKI- improving, stable: -Continuing to push PO fluids -Encourage PO fluid intake: Gatorade and H2O   Insomnia Continue Ambien to 10 mg nightly (started on 1/21, increased to 10 mg 1/22) Continue Melatonin 10mg  qhs   Hyperthyroidism: Spoke with hospitalist (1/3)  -Continue methimazole at 5 mg bid  -TSH (12/29)- 6.847>>> 12.328 (1/3)>>>6.397  (12-02-21) -Free T4 0.84 wnl (12-02-21)  -Follow up with PCP for further management   Constipation - Continue Miralax daily - KUB, noted distended bladder and moderate stool burden - Continue Aricept   Other PRNs:  -Continue Protonix 20 mg daily -Continue PRN's: Tylenol, Maalox, Atarax, Milk of Magnesia, Trazodone   Toe pain - Volatren gel QID   PGY-2 Freida Busman, MD 12/21/2021, 3:24 PM

## 2021-12-21 NOTE — BH IP Treatment Plan (Signed)
Interdisciplinary Treatment and Diagnostic Plan Update  12/21/2021 Time of Session: 10:35am  Paula Dickson MRN: [id number]  Principal Diagnosis: Schizoaffective disorder, bipolar type [facility])  Secondary Diagnoses: Principal Problem:   Schizoaffective disorder, bipolar type ([place]) Active Problems:   Hyperthyroidism   Benign essential HTN   AKI (acute kidney injury) ([place])   Cognitive dysfunction in chronic schizophrenia ([place])   Current Medications:  Current Facility-Administered Medications  Medication Dose Route Frequency Provider Last Rate Last Admin   acetaminophen (TYLENOL) tablet 650 mg  650 mg Oral Q6H PRN Ethelene Hal, NP   650 mg at 11/27/21 2347   alum & mag hydroxide-simeth (MAALOX/MYLANTA) 200-200-20 MG/5ML suspension 30 mL  30 mL Oral Q4H PRN Ethelene Hal, NP       amantadine (SYMMETREL) capsule 100 mg  100 mg Oral Daily Nicholes Rough, NP   100 mg at 12/18/21 5462   benztropine (COGENTIN) tablet 1 mg  1 mg Oral BID Nicholes Rough, NP   1 mg at 12/20/21 1811   Or   benztropine mesylate (COGENTIN) injection 1 mg  1 mg Intramuscular BID Nicholes Rough, NP   1 mg at 12/19/21 7035   clonazePAM (KLONOPIN) tablet 1 mg  1 mg Oral BID Nicholes Rough, NP   1 mg at 12/18/21 1743   diclofenac Sodium (VOLTAREN) 1 % topical gel 2 g  2 g Topical QID Damita Dunnings B, MD   2 g at 12/17/21 0802   diphenhydrAMINE (BENADRYL) injection 50 mg  50 mg Intramuscular Q6H PRN Massengill, Ovid Curd, MD   50 mg at 12/13/21 2250   divalproex (DEPAKOTE ER) 24 hr tablet 750 mg  750 mg Oral Q supper Damita Dunnings B, MD   750 mg at 12/20/21 1820   donepezil (ARICEPT) tablet 5 mg  5 mg Oral Q supper Damita Dunnings B, MD   5 mg at 12/18/21 1738   haloperidol (HALDOL) tablet 10 mg  10 mg Oral QHS Viann Fish E, MD   10 mg at 12/20/21 1811   Or   haloperidol lactate (HALDOL) injection 10 mg  10 mg Intramuscular QHS Nelda Marseille, Amy E, MD   10 mg at 12/19/21 2032   haloperidol (HALDOL)  tablet 5 mg  5 mg Oral Q6H PRN Maida Sale, MD       And   LORazepam (ATIVAN) tablet 1 mg  1 mg Oral Q6H PRN Hill, Jackie Plum, MD       haloperidol (HALDOL) tablet 5 mg  5 mg Oral Daily Nelda Marseille, Amy E, MD   5 mg at 12/20/21 0093   Or   haloperidol lactate (HALDOL) injection 5 mg  5 mg Intramuscular Daily Nelda Marseille, Amy E, MD   5 mg at 12/19/21 0957   haloperidol lactate (HALDOL) injection 5 mg  5 mg Intramuscular Q6H PRN Maida Sale, MD   5 mg at 12/11/21 2123   LORazepam (ATIVAN) injection 1 mg  1 mg Intramuscular Q6H PRN Viann Fish E, MD   1 mg at 12/19/21 2031   melatonin tablet 10 mg  10 mg Oral QHS Hill, Jackie Plum, MD   10 mg at 12/20/21 2023   methimazole (TAPAZOLE) tablet 5 mg  5 mg Oral BID Massengill, Ovid Curd, MD   5 mg at 12/21/21 0935   OLANZapine zydis (ZYPREXA) disintegrating tablet 10 mg  10 mg Oral Daily Viann Fish E, MD   10 mg at 12/20/21 1821   Or   OLANZapine (ZYPREXA) injection 10 mg  10  mg Intramuscular Daily Nelda Marseille, Amy E, MD       pantoprazole (PROTONIX) EC tablet 20 mg  20 mg Oral Daily Rosezetta Schlatter, MD   20 mg at 12/21/21 0935   polyethylene glycol (MIRALAX / GLYCOLAX) packet 17 g  17 g Oral Daily Damita Dunnings B, MD   17 g at 12/20/21 0834   propranolol ER (INDERAL LA) 24 hr capsule 60 mg  60 mg Oral Daily Massengill, Ovid Curd, MD   60 mg at 12/21/21 0938   white petrolatum (VASELINE) gel   Topical PRN Rosezetta Schlatter, MD   Given at 12/04/21 1516   zolpidem (AMBIEN) tablet 10 mg  10 mg Oral QHS Viann Fish E, MD   10 mg at 12/20/21 2023   PTA Medications: Medications Prior to Admission  Medication Sig Dispense Refill Last Dose   losartan (COZAAR) 25 MG tablet Take 1 tablet (25 mg total) by mouth daily. 30 tablet 0 Past Month   methimazole (TAPAZOLE) 10 MG tablet Take 1 tablet (10 mg total) by mouth 2 (two) times daily. 60 tablet 2 Past Month   OLANZapine (ZYPREXA) 10 MG tablet Take 10 mg by mouth at bedtime.   Past Month    pantoprazole (PROTONIX) 20 MG tablet Take 20 mg by mouth daily.   Past Month   propranolol (INDERAL) 20 MG tablet Take 1 tablet (20 mg total) by mouth 2 (two) times daily. (Patient taking differently: Take 10 mg by mouth 2 (two) times daily.) 60 tablet 0 Past Month   LORazepam (ATIVAN) 1 MG tablet Take 1 mg by mouth 3 (three) times daily.       Patient Stressors: Health problems   Medication change or noncompliance    Patient Strengths: Motivation for treatment/growth  Supportive family/friends   Treatment Modalities: Medication Management, Group therapy, Case management,  1 to 1 session with clinician, Psychoeducation, Recreational therapy.   Physician Treatment Plan for Primary Diagnosis: Schizoaffective disorder, bipolar type ([place]) Long Term Goal(s): Improvement in symptoms so as ready for discharge   Short Term Goals: Ability to identify changes in lifestyle to reduce recurrence of condition will improve Ability to verbalize feelings will improve Ability to demonstrate self-control will improve Ability to identify and develop effective coping behaviors will improve Ability to maintain clinical measurements within normal limits will improve Compliance with prescribed medications will improve  Medication Management: Evaluate patient's response, side effects, and tolerance of medication regimen.  Therapeutic Interventions: 1 to 1 sessions, Unit Group sessions and Medication administration.  Evaluation of Outcomes: Progressing  Physician Treatment Plan for Secondary Diagnosis: Principal Problem:   Schizoaffective disorder, bipolar type ([place]) Active Problems:   Hyperthyroidism   Benign essential HTN   AKI (acute kidney injury) ([place])   Cognitive dysfunction in chronic schizophrenia ([place])  Long Term Goal(s): Improvement in symptoms so as ready for discharge   Short Term Goals: Ability to identify changes in lifestyle to reduce recurrence of condition will improve Ability to  verbalize feelings will improve Ability to demonstrate self-control will improve Ability to identify and develop effective coping behaviors will improve Ability to maintain clinical measurements within normal limits will improve Compliance with prescribed medications will improve     Medication Management: Evaluate patient's response, side effects, and tolerance of medication regimen.  Therapeutic Interventions: 1 to 1 sessions, Unit Group sessions and Medication administration.  Evaluation of Outcomes: Progressing   RN Treatment Plan for Primary Diagnosis: Schizoaffective disorder, bipolar type ([place]) Long Term Goal(s): Knowledge of disease and therapeutic regimen  to maintain health will improve  Short Term Goals: Ability to remain free from injury will improve, Ability to participate in decision making will improve, Ability to verbalize feelings will improve, Ability to disclose and discuss suicidal ideas, and Ability to identify and develop effective coping behaviors will improve  Medication Management: RN will administer medications as ordered by provider, will assess and evaluate patient's response and provide education to patient for prescribed medication. RN will report any adverse and/or side effects to prescribing provider.  Therapeutic Interventions: 1 on 1 counseling sessions, Psychoeducation, Medication administration, Evaluate responses to treatment, Monitor vital signs and CBGs as ordered, Perform/monitor CIWA, COWS, AIMS and Fall Risk screenings as ordered, Perform wound care treatments as ordered.  Evaluation of Outcomes: Progressing   LCSW Treatment Plan for Primary Diagnosis: Schizoaffective disorder, bipolar type ([place]) Long Term Goal(s): Safe transition to appropriate next level of care at discharge, Engage patient in therapeutic group addressing interpersonal concerns.  Short Term Goals: Engage patient in aftercare planning with referrals and resources, Increase social  support, Increase emotional regulation, Facilitate acceptance of mental health diagnosis and concerns, Identify triggers associated with mental health/substance abuse issues, and Increase skills for wellness and recovery  Therapeutic Interventions: Assess for all discharge needs, 1 to 1 time with Social worker, Explore available resources and support systems, Assess for adequacy in community support network, Educate family and significant other(s) on suicide prevention, Complete Psychosocial Assessment, Interpersonal group therapy.  Evaluation of Outcomes: Progressing   Progress in Treatment: Attending groups: Yes. Participating in groups: Yes. Taking medication as prescribed: Yes. Toleration medication: Yes. Family/Significant other contact made: Yes, individual(s) contacted:  Mother  Patient understands diagnosis: No. Discussing patient identified problems/goals with staff: Yes. Medical problems stabilized or resolved: Yes. Denies suicidal/homicidal ideation: Yes. Issues/concerns per patient self-inventory: No.     New problem(s) identified: No, Describe:  None    New Short Term/Long Term Goal(s): medication stabilization, elimination of SI thoughts, development of comprehensive mental wellness plan.    Patient Goals:  Declined to participate   Discharge Plan or Barriers: CSW made an ACTT referral for pt. An APS report has been made for pt.   Reason for Continuation of Hospitalization: Delusions  Hallucinations Mania Medication stabilization   Estimated Length of Stay: 3 to 5 days    Scribe for Treatment Team: Darleen Crocker, Latanya Presser 12/21/2021 3:50 PM

## 2021-12-21 NOTE — Plan of Care (Signed)
°  Problem: Activity: Goal: Interest or engagement in activities will improve Outcome: Progressing   Problem: Safety: Goal: Periods of time without injury will increase Outcome: Progressing   Problem: Coping: Goal: Will verbalize feelings Outcome: Progressing   Problem: Health Behavior/Discharge Planning: Goal: Compliance with prescribed medication regimen will improve Outcome: Not Progressing   Problem: Nutritional: Goal: Ability to achieve adequate nutritional intake will improve Outcome: Progressing

## 2021-12-21 NOTE — Group Note (Signed)
LCSW Group Therapy Note  Group Date: 12/21/2021 Start Time: 1300 End Time: 1400   Type of Therapy and Topic:  Group Therapy: Using "I" Statements  Participation Level:  Active  Description of Group:  Patients were asked to provide details of some interpersonal conflicts they have experienced. Patients were then educated about I statements, communication which focuses on feelings or views of the speaker rather than what the other person is doing. T group members were asked to reflect on past conflicts and to provide specific examples for utilizing I statements.  Therapeutic Goals:  Patients will verbalize understanding of ineffective communication and effective communication. Patients will be able to empathize with whom they are having conflict. Patients will practice effective communication in the form of I statements.    Summary of Patient Progress:  Patient attended and participated in treatment. Pt often started off appropriately however would then veer off and talk about other matters.   Therapeutic Modalities:   Cognitive Behavioral Therapy Solution-Focused Therapy    Vassie Moselle, LCSW 12/21/2021  1:46 PM

## 2021-12-21 NOTE — Progress Notes (Signed)
Pt was encouraged but didn't attend therapeutic relaxation group. ?

## 2021-12-21 NOTE — BHH Counselor (Signed)
This patient remains on the [facility] waitlist.     Darletta Moll MSW, [place] Worker  [facility]

## 2021-12-22 NOTE — Progress Notes (Signed)
°   12/22/21 0545  Sleep  Number of Hours 5

## 2021-12-22 NOTE — Progress Notes (Signed)
Pt continues to be labile on the unit, pt refused PO medications, will continue to monitor and encourage compliance    12/22/21 2100  Psych Admission Type (Psych Patients Only)  Admission Status Involuntary  Psychosocial Assessment  Patient Complaints Suspiciousness  Eye Contact Glaring  Facial Expression Animated;Wide-eyed  Affect Irritable;Labile;Preoccupied;Angry  Speech Incoherent;Rapid;Pressured;Tangential  Interaction Hostile;Intrusive  Motor Activity Restless;Hyperactive;Pacing  Appearance/Hygiene Layered clothes  Behavior Characteristics Resistant to care;Anxious  Mood Labile  Thought Process  Coherency Concrete thinking;Disorganized;Tangential  Content Blaming others;Delusions;Preoccupation;Paranoia  Delusions Paranoid  Perception Hallucinations  Hallucination Auditory;Visual  Judgment Poor  Confusion Mild  Danger to Self  Current suicidal ideation? Denies  Danger to Others  Danger to Others None reported or observed

## 2021-12-22 NOTE — Progress Notes (Signed)
[facility] MD Progress Note  12/22/2021 12:41 PM Paula Dickson  MRN:  [id number] Subjective:  [name] is a 56 year old female with a longstanding psychiatric history of schizoaffective disorder- bipolar type complicated by medication noncompliance and previous suicide attempt via OD who was admitted voluntarily from [facility] for bizarre behaviors and nonsensical speech after medication noncompliance. While in the ED, she was given Geodon x1 on 12/27, Ativan x1 when refusing meds and unable to de-escalate on 12/28.   Case was discussed in the multidisciplinary team. MAR was reviewed and patient was not compliant with meds last night but was complaint this morning for all medications this AM with the exception of Klonopin.      Psychiatric Team made the following recommendations yesterday:  - Continue Cogentin 1mg  PO BID or IM if patient refuses  for parkinsonian symptoms (continue Miralax qd and watch for constipation) - Discontinued Restoril 12/17/21 due to ineffectiveness - Continue Haldol 5mg  qam and 10mg  at supper with forced IM backup if refuses (consolidated to bid to reduce number of daily injections and help with compliance) - Continue Ativan 1mg  q6h PRN -Continue Depakote ER 750mg   -(1/9) VA level: 117, CBC-WNL, hepatic function panel-AST 12 , follow-up repeat Depakote level (14) , CBC (WNL), and AST 21 and ALT 14 - will need repeat VPA level once she is consistently taking medication  - Continue Zyprexa 10mg  once a day with IM backup if refuses (consolidated to qd to reduce number of daily injections and help with compliance) - Continue Amantadine 100mg  every day, for EPS.  - will watch for worsening psychosis with restart of medication -Continue Aricept 5mg  QHS for cognitive impairment, patient repsoding well, consider increase to BID in a 3-4 days EKG- QTC 437 (1/15) -Discontinued Clozapine 12.5mg  qhs due to non compliance (12/18/21) - can attempt to resume if she consistently starts taking o  medication  Patient appears very pleasant and giggly today. Patient is walking with shorter steps, but seems very happy and almost excited. Patient does not endorse SI, HI or AVH.  Patient reports she hears the MHT's talk, but says disclosing what they talk about would violate patient's privacy. Patient appears very happy to talk to attending provider, but report she does not trust the resident provider and stares at resident provider when resident ask's questions. Patient does answer resident's questions. Patient appears to allude to residents shoes and her medication list as why she does not trust the resident today. Patient reports to attending and resident that she was made aware she was IVC'd, and learned this approx 2 days ago. Patient appears to endorse that she feels she understands this is why she is not able to go home. Patient endorses that she is worried about bills being paid and missing out on family things due to her time in the hospital. Patient reports that she has been in contact with family and friends and they are having good conversations. Patient goes off on tangents talking about her family and things in the past and her parents. Patient also spends more of tangentiality talking about her spiritual beliefs today. At one point patient gets up and does odd movements with her body holding her hands out the side like in a T shape or Cross and then raises them above her head. Patient is also talks a different times about sitting in chairs and previous work experiences. Patient endorses that she is compliant with her medications today including haldol this AM. Patient endorses reluctance to take  Klonopin and voices that she does not like that medication adjustments were made over the weekend, and endorses that no one has explained why these were made. Patient reports she will continue to take her PO meds because she knows if she does not she will get a shot. Patient endorses she will take the  Haldol as requested today. Patient is very pleasant and providers were not kicked out of her room today.  Principal Problem: Schizoaffective disorder, bipolar type ([place]) Diagnosis: Principal Problem:   Schizoaffective disorder, bipolar type ([place]) Active Problems:   Hyperthyroidism   Benign essential HTN   AKI (acute kidney injury) ([place])   Cognitive dysfunction in chronic schizophrenia ([place])  Total Time spent with patient: 20 minutes  Past Psychiatric History:  See H&P  Past Medical History:  Past Medical History:  Diagnosis Date   Bipolar affective disorder ([place])    Bipolar disorder ([place])    History of arthritis    History of chicken pox    History of depression    History of genital warts    history of heart murmur    History of high blood pressure    History of thyroid disease    History of UTI    Hypertension    Low TSH level 07/13/2017   Schizophrenia ([place])     Past Surgical History:  Procedure Laterality Date   ABLATION ON ENDOMETRIOSIS     CYST REMOVAL NECK     around 11 years ago /benign   MULTIPLE TOOTH EXTRACTIONS     Family History:  Family History  Problem Relation Age of Onset   Arthritis Father    Hyperlipidemia Father    High blood pressure Father    Diabetes Sister    Diabetes Mother    Diabetes Brother    Mental illness Brother    Alcohol abuse Paternal Uncle    Alcohol abuse Paternal Grandfather    Breast cancer Maternal Aunt    Breast cancer Paternal Aunt    High blood pressure Sister    Mental illness Other        runs in family   Family Psychiatric  History:  See H&P Social History:  Social History   Substance and Sexual Activity  Alcohol Use Not Currently     Social History   Substance and Sexual Activity  Drug Use Not Currently    Social History   Socioeconomic History   Marital status: Single    Spouse name: Not on file   Number of children: Not on file   Years of education: Not on file   Highest education level: Patient  refused  Occupational History   Not on file  Tobacco Use   Smoking status: Never   Smokeless tobacco: Never  Vaping Use   Vaping Use: Never used  Substance and Sexual Activity   Alcohol use: Not Currently   Drug use: Not Currently   Sexual activity: Not Currently  Other Topics Concern   Not on file  Social History Narrative   ** Merged History Encounter **       Social Determinants of Health   Financial Resource Strain: Not on file  Food Insecurity: Not on file  Transportation Needs: Not on file  Physical Activity: Not on file  Stress: Not on file  Social Connections: Not on file   Additional Social History:  Sleep: Good  Appetite:  Good  Current Medications: Current Facility-Administered Medications  Medication Dose Route Frequency Provider Last Rate Last Admin   acetaminophen (TYLENOL) tablet 650 mg  650 mg Oral Q6H PRN Ethelene Hal, NP   650 mg at 11/27/21 2347   alum & mag hydroxide-simeth (MAALOX/MYLANTA) 200-200-20 MG/5ML suspension 30 mL  30 mL Oral Q4H PRN Ethelene Hal, NP       amantadine (SYMMETREL) capsule 100 mg  100 mg Oral Daily Nicholes Rough, NP   100 mg at 12/22/21 0735   benztropine (COGENTIN) tablet 1 mg  1 mg Oral BID Nicholes Rough, NP   1 mg at 12/22/21 5462   Or   benztropine mesylate (COGENTIN) injection 1 mg  1 mg Intramuscular BID Nicholes Rough, NP   1 mg at 12/19/21 7035   clonazePAM (KLONOPIN) tablet 1 mg  1 mg Oral BID Nicholes Rough, NP   1 mg at 12/18/21 1743   diclofenac Sodium (VOLTAREN) 1 % topical gel 2 g  2 g Topical QID Damita Dunnings B, MD   2 g at 12/17/21 0802   diphenhydrAMINE (BENADRYL) injection 50 mg  50 mg Intramuscular Q6H PRN Massengill, Ovid Curd, MD   50 mg at 12/13/21 2250   divalproex (DEPAKOTE ER) 24 hr tablet 750 mg  750 mg Oral Q supper Damita Dunnings B, MD   750 mg at 12/21/21 1710   donepezil (ARICEPT) tablet 5 mg  5 mg Oral Q supper Damita Dunnings B, MD   5 mg at  12/18/21 1738   haloperidol (HALDOL) tablet 10 mg  10 mg Oral QHS Viann Fish E, MD   10 mg at 12/20/21 1811   Or   haloperidol lactate (HALDOL) injection 10 mg  10 mg Intramuscular QHS Nelda Marseille, Amy E, MD   10 mg at 12/19/21 2032   haloperidol (HALDOL) tablet 5 mg  5 mg Oral Q6H PRN Maida Sale, MD       And   LORazepam (ATIVAN) tablet 1 mg  1 mg Oral Q6H PRN Hill, Jackie Plum, MD       haloperidol (HALDOL) tablet 5 mg  5 mg Oral Daily Nelda Marseille, Amy E, MD   5 mg at 12/22/21 1033   Or   haloperidol lactate (HALDOL) injection 5 mg  5 mg Intramuscular Daily Nelda Marseille, Amy E, MD   5 mg at 12/19/21 0957   haloperidol lactate (HALDOL) injection 5 mg  5 mg Intramuscular Q6H PRN Maida Sale, MD   5 mg at 12/11/21 2123   LORazepam (ATIVAN) injection 1 mg  1 mg Intramuscular Q6H PRN Viann Fish E, MD   1 mg at 12/19/21 2031   melatonin tablet 10 mg  10 mg Oral QHS Hill, Jackie Plum, MD   10 mg at 12/21/21 2029   methimazole (TAPAZOLE) tablet 5 mg  5 mg Oral BID Massengill, Ovid Curd, MD   5 mg at 12/22/21 0733   OLANZapine zydis (ZYPREXA) disintegrating tablet 10 mg  10 mg Oral Daily Nelda Marseille, Amy E, MD   10 mg at 12/20/21 1821   Or   OLANZapine (ZYPREXA) injection 10 mg  10 mg Intramuscular Daily Nelda Marseille, Amy E, MD       pantoprazole (PROTONIX) EC tablet 20 mg  20 mg Oral Daily Rosezetta Schlatter, MD   20 mg at 12/22/21 0732   polyethylene glycol (MIRALAX / GLYCOLAX) packet 17 g  17 g Oral Daily Damita Dunnings B, MD   17 g at 12/20/21  2703   propranolol ER (INDERAL LA) 24 hr capsule 60 mg  60 mg Oral Daily Massengill, Nathan, MD   60 mg at 12/22/21 5009   white petrolatum (VASELINE) gel   Topical PRN Rosezetta Schlatter, MD   Given at 12/04/21 1516   zolpidem (AMBIEN) tablet 10 mg  10 mg Oral QHS Harlow Asa, MD   10 mg at 12/21/21 2029    Lab Results: No results found for this or any previous visit (from the past 48 hour(s)).  Blood Alcohol level:  Lab Results   Component Value Date   ETH <10 11/22/2021   ETH <10 38/18/2993    Metabolic Disorder Labs: Lab Results  Component Value Date   HGBA1C 6.1 (H) 11/24/2021   MPG 128.37 11/24/2021   MPG 119.76 03/28/2021   Lab Results  Component Value Date   PROLACTIN 11.0 03/14/2019   Lab Results  Component Value Date   CHOL 127 11/24/2021   TRIG 161 (H) 11/24/2021   HDL 39 (L) 11/24/2021   CHOLHDL 3.3 11/24/2021   VLDL 32 11/24/2021   LDLCALC 56 11/24/2021   LDLCALC 95 10/26/2020    Physical Findings: AIMS: Facial and Oral Movements Muscles of Facial Expression: None, normal Lips and Perioral Area: None, normal Jaw: None, normal Tongue: None, normal,Extremity Movements Upper (arms, wrists, hands, fingers): Mild Lower (legs, knees, ankles, toes): None, normal, Trunk Movements Neck, shoulders, hips: None, normal, Overall Severity Severity of abnormal movements (highest score from questions above): None, normal Incapacitation due to abnormal movements: None, normal Patient's awareness of abnormal movements (rate only patient's report): No Awareness, Dental Status Current problems with teeth and/or dentures?: No Does patient usually wear dentures?: No  CIWA:    COWS:     Musculoskeletal: Strength & Muscle Tone: within normal limits Gait & Station: normal Patient leans: N/A  Psychiatric Specialty Exam:  Presentation  General Appearance: Appropriate for Environment; Casual  Eye Contact:Fair  Speech:Clear and Coherent; Pressured  Speech Volume:Normal  Handedness:Right   Mood and Affect  Mood:Euphoric (more giggly and restless appearing)  Affect:Congruent   Thought Process  Thought Processes:Disorganized (more goal directed than yesterday)  Descriptions of Associations:Tangential  Orientation:Full (Time, Place and Person)  Thought Content:Tangential  History of Schizophrenia/Schizoaffective disorder:Yes  Duration of Psychotic Symptoms:Greater than six  months  Hallucinations:Hallucinations: None  Ideas of Reference:Delusions  Suicidal Thoughts:Suicidal Thoughts: No  Homicidal Thoughts:Homicidal Thoughts: No   Sensorium  Memory:Immediate Fair; Recent Poor  Judgment:Impaired  Insight:Shallow   Executive Functions  Concentration:Poor (but better than yesterday)  Attention Span:Poor  [place] of Knowledge:Poor  Language:Poor   Psychomotor Activity  Psychomotor Activity:Psychomotor Activity: Increased; Restlessness   Assets  Assets:Resilience   Sleep  Sleep:Sleep: Fair (5 h!)    Physical Exam: Physical Exam HENT:     Head: Normocephalic and atraumatic.  Pulmonary:     Effort: Pulmonary effort is normal.  Neurological:     Mental Status: She is alert.   Review of Systems  Gastrointestinal:  Negative for abdominal pain.  Genitourinary:  Positive for frequency. Negative for dysuria.  Psychiatric/Behavioral:  Negative for depression, hallucinations and suicidal ideas. The patient does not have insomnia.   Blood pressure (!) 144/101, pulse 74, temperature 98 F (36.7 C), temperature source Oral, resp. rate 16, height 5' (1.524 m), weight 76.7 kg, SpO2 100 %. Body mass index is 33.01 kg/m.   Treatment Plan Summary: Daily contact with patient to assess and evaluate symptoms and progress in treatment and Medication management  Rowena is a 56 yo patient w/ PPH of schizophrenia. Patient is more pleasant today although she does have specific staff she is less pleasant with, she is more compliant. Patient continues to be upset about her medications and despite being given a list of meds, she somewhat perseverates on the list being incorrect. Patient is more goal oriented in her thoughts when she is more pleasant. Patient was also less irritable about being asked questions or being interrupted for assessment. Patient endorsed today she is willing to get lab work. Patient tremor also appears to be improving  per patient.   - Continue Cogentin 1mg  PO BID or IM if patient refuses  for parkinsonian symptoms (continue Miralax qd and watch for constipation) - Discontinued Restoril 12/17/21 due to ineffectiveness - Continue Haldol 5mg  qam and 10mg  at supper with forced IM backup if refuses (consolidated to bid to reduce number of daily injections and help with compliance) - Continue Ativan 1mg  q6h PRN -Continue Depakote ER 750mg   -(1/9) VA level: 117, CBC-WNL, hepatic function panel-AST 12 , follow-up repeat Depakote level (14) , CBC (WNL), and AST 21 and ALT 14 - will need repeat VPA level once she is consistently taking medication  - Repeat Depakote lvl :1/27) w/ CBC, TSH,  CMP - Continue Zyprexa 10mg  once a day with IM backup if refuses (consolidated to qd to reduce number of daily injections and help with compliance) - Continue Amantadine 100mg  every day, for EPS.  - will watch for worsening psychosis with restart of medication -Continue Aricept 5mg  QHS for cognitive impairment, patient repsoding well, consider increase to BID in a 3-4 days EKG- QTC 437 (1/15); (1/26)EKG- QTC: 422 -Discontinued Clozapine 12.5mg  qhs due to non compliance (12/18/21) - can attempt to resume if she consistently starts taking o medication   HTN   Tachycardia: -Continue Propanolol ER 60 mg once daily  - Creatinine 1.42> 1.60>1.29>1.29>1.52   Anxiety & Restlessness -Continue Clonazepam 1 mg BID   AKI- improving, stable: -Continuing to push PO fluids -Encourage PO fluid intake: Gatorade and H2O   Insomnia Continue Ambien to 10 mg nightly (started on 1/21, increased to 10 mg 1/22) Continue Melatonin 10mg  qhs   Hyperthyroidism: Spoke with hospitalist (1/3)  -Continue methimazole at 5 mg bid  -TSH (12/29)- 6.847>>> 12.328 (1/3)>>>6.397 (12-02-21) -Free T4 0.84 wnl (12-02-21)  -Follow up with PCP for further management   Constipation - Continue Miralax daily - KUB, noted distended bladder and moderate stool burden -  Continue Aricept   Other PRNs:  -Continue Protonix 20 mg daily -Continue PRN's: Tylenol, Maalox, Atarax, Milk of Magnesia, Trazodone   Toe pain - Volatren gel QID  PGY-2 Freida Busman, MD 12/22/2021, 12:41 PM

## 2021-12-22 NOTE — BHH Group Notes (Signed)
Adult Psychoeducational Group Note  Date:  12/22/2021 Time:  9:07 AM  Group Topic/Focus:  Goals Group:   The focus of this group is to help patients establish daily goals to achieve during treatment and discuss how the patient can incorporate goal setting into their daily lives to aide in recovery.  Participation Level:  Active  Participation Quality:  Appropriate  Affect:  Appropriate  Cognitive:  Appropriate  Insight: Appropriate  Engagement in Group:  Engaged  Modes of Intervention:  Discussion  Additional Comments:  Pt said her goal is to get her meds changed  Dub Mikes 12/22/2021, 9:07 AM

## 2021-12-22 NOTE — Group Note (Signed)
Recreation Therapy Group Note   Group Topic:Other  Group Date: 12/22/2021 Start Time: 1000 End Time: 1030 Facilitators: [name], LRT,CTRS Location: [address] Dayroom   Goal Area(s) Addresses:  Patient will define what a trigger is. Patient will identify what triggers them. Patient will identify ways to cope with triggers post d/c.  Group Description:  LRT and patients discuss what a trigger is and what it does.  Patients will then be given worksheets that ask patients to describe their biggest triggers, what strategies are used to avoid or reduce exposure to triggers and strategies for dealing with triggers head on.  Patients are given 15-20 minutes to complete worksheet before coming back together as a group to discuss.   Affect/Mood: Labile   Participation Level: Hyperverbal   Participation Quality: Maximum Cues   Behavior: Uncontrollable Laughing   Speech/Thought Process: Irrational   Insight: Limited   Judgement: Limited   Modes of Intervention: Worksheet   Patient Response to Interventions:  Challenging  and Receptive   Education Outcome:  Acknowledges education and In group clarification offered    Clinical Observations/Individualized Feedback: Pt needed multiple prompts to redirect to the topic at hand.  Pt had a hard time trying to talk through her laughter.  Pt was unable to continue with group due to being called  out of group to meet with doctors.  Patient was called out at the beginning of group during the discussion.     Plan: Continue to engage patient in RT group sessions 2-3x/week.   [name], LRT,CTRS 12/22/2021 10:55 AM

## 2021-12-22 NOTE — Progress Notes (Signed)
Pt continues to present with labile mood and affect.  Pt erupts into laughter for no apparent reason and then suddenly becomes argumentative and refuses to take medications.  Pt presents with disorganized thoughts and continues to speak in nonsensical words.

## 2021-12-23 LAB — TSH: TSH: 5.034 u[IU]/mL — ABNORMAL HIGH (ref 0.350–4.500)

## 2021-12-23 LAB — CBC WITH DIFFERENTIAL/PLATELET
Abs Immature Granulocytes: 0.02 10*3/uL (ref 0.00–0.07)
Basophils Absolute: 0.1 10*3/uL (ref 0.0–0.1)
Basophils Relative: 1 %
Eosinophils Absolute: 0.4 10*3/uL (ref 0.0–0.5)
Eosinophils Relative: 6 %
HCT: 39.2 % (ref 36.0–46.0)
Hemoglobin: 12.5 g/dL (ref 12.0–15.0)
Immature Granulocytes: 0 %
Lymphocytes Relative: 40 %
Lymphs Abs: 2.7 10*3/uL (ref 0.7–4.0)
MCH: 28.7 pg (ref 26.0–34.0)
MCHC: 31.9 g/dL (ref 30.0–36.0)
MCV: 90.1 fL (ref 80.0–100.0)
Monocytes Absolute: 0.5 10*3/uL (ref 0.1–1.0)
Monocytes Relative: 7 %
Neutro Abs: 3.1 10*3/uL (ref 1.7–7.7)
Neutrophils Relative %: 46 %
Platelets: 189 10*3/uL (ref 150–400)
RBC: 4.35 MIL/uL (ref 3.87–5.11)
RDW: 12.8 % (ref 11.5–15.5)
WBC: 6.8 10*3/uL (ref 4.0–10.5)
nRBC: 0 % (ref 0.0–0.2)

## 2021-12-23 LAB — COMPREHENSIVE METABOLIC PANEL
ALT: 15 U/L (ref 0–44)
AST: 13 U/L — ABNORMAL LOW (ref 15–41)
Albumin: 3.7 g/dL (ref 3.5–5.0)
Alkaline Phosphatase: 69 U/L (ref 38–126)
Anion gap: 6 (ref 5–15)
BUN: 24 mg/dL — ABNORMAL HIGH (ref 6–20)
CO2: 29 mmol/L (ref 22–32)
Calcium: 9.8 mg/dL (ref 8.9–10.3)
Chloride: 105 mmol/L (ref 98–111)
Creatinine, Ser: 1.06 mg/dL — ABNORMAL HIGH (ref 0.44–1.00)
GFR, Estimated: >60 mL/min (ref >60)
Glucose, Bld: 101 mg/dL — ABNORMAL HIGH (ref 70–99)
Potassium: 4.3 mmol/L (ref 3.5–5.1)
Sodium: 140 mmol/L (ref 135–145)
Total Bilirubin: 0.4 mg/dL (ref 0.3–1.2)
Total Protein: 6.8 g/dL (ref 6.5–8.1)

## 2021-12-23 LAB — VALPROIC ACID LEVEL: Valproic Acid Lvl: 51 ug/mL (ref 50.0–100.0)

## 2021-12-23 MED ORDER — BENZTROPINE MESYLATE 1 MG PO TABS: 1.0000 mg | ORAL_TABLET | Freq: Two times a day (BID) | ORAL | 0 refills | Status: DC

## 2021-12-23 MED ORDER — DIVALPROEX SODIUM ER 250 MG PO TB24: 750.0000 mg | ORAL_TABLET | Freq: Every day | ORAL | 0 refills | Status: DC

## 2021-12-23 MED ORDER — OLANZAPINE 10 MG PO TABS
10.0000 mg | ORAL_TABLET | Freq: Every day | ORAL | Status: DC
  Filled 2021-12-23 (×2): qty 7

## 2021-12-23 MED ORDER — HALOPERIDOL 5 MG PO TABS: 5.0000 mg | ORAL_TABLET | Freq: Every day | ORAL | 0 refills | Status: DC

## 2021-12-23 MED ORDER — MELATONIN 10 MG PO TABS: 10.0000 mg | ORAL_TABLET | Freq: Every day | ORAL | 0 refills | Status: DC

## 2021-12-23 MED ORDER — METHIMAZOLE 5 MG PO TABS: 5.0000 mg | ORAL_TABLET | Freq: Two times a day (BID) | ORAL | 0 refills | Status: DC

## 2021-12-23 MED ORDER — AMANTADINE HCL 100 MG PO CAPS: 100.0000 mg | ORAL_CAPSULE | Freq: Every day | ORAL | 0 refills | Status: DC

## 2021-12-23 MED ORDER — HALOPERIDOL 10 MG PO TABS: 10.0000 mg | ORAL_TABLET | Freq: Every day | ORAL | 0 refills | Status: DC

## 2021-12-23 MED ORDER — ZOLPIDEM TARTRATE 10 MG PO TABS: 10.0000 mg | ORAL_TABLET | Freq: Every day | ORAL | 0 refills | Status: DC

## 2021-12-23 MED ORDER — PROPRANOLOL HCL ER 60 MG PO CP24: 60.0000 mg | ORAL_CAPSULE | Freq: Every day | ORAL | 0 refills | Status: DC

## 2021-12-23 NOTE — Group Note (Signed)
LCSW Group Therapy Note   Group Date: 12/23/2021 Start Time: 1100 End Time: 1200   Due to activities disrupting the unit group was not held.    Vassie Moselle, LCSW 12/23/2021  11:59 AM

## 2021-12-23 NOTE — BHH Suicide Risk Assessment (Cosign Needed Addendum)
Suicide Risk Assessment  Discharge Assessment    [facility] Discharge Suicide Risk Assessment   Principal Problem: Schizoaffective disorder, bipolar type [facility]) Discharge Diagnoses: Principal Problem:   Schizoaffective disorder, bipolar type ([place]) Active Problems:   Hyperthyroidism   Benign essential HTN   AKI (acute kidney injury) ([place])   Cognitive dysfunction in chronic schizophrenia ([place])   Total Time spent with patient: 91 minutes Paula Dickson is a 56 yr old female who presents with bizarre behaviors and nonsensical speech after medication noncompliance.  PPHx is significant for Schizoaffective Disorder- Bipolar type complicated by medication noncompliance and previous suicide attempt (OD in 2018), multiple hospitalizations (latest 11/21 [facility]).   During the patient's hospitalization, patient had extensive initial psychiatric evaluation, and follow-up psychiatric evaluations every day.   Psychiatric diagnoses provided upon initial assessment:  #Schizoaffective disorder- Bipolar type   Patient's psychiatric medications were adjusted on admission:  -Discontinue Zyprexa 10 mg qHS  -Discontinue Lorazepam 1 mg TID - pt was not taking as prescribed (maybe one dose/day at bedtime). Monitor for w/d symptoms.  -Initiate Geodon 40 BID.  - Initiate Restoril 15 mg qHS for insomnia   During the hospitalization, other adjustments were made to the patient's psychiatric medication regimen:  -Geodon was increased to 80 mg bid with food and eventually discontinued due to failure - Haldol started  -restoril was increased to 30 mg qhs and eventually discontinued due to failure - Ambien 10mg  started and patient began sleeping -olanzapine was restarted and increased to 5 mg in the morning and 10 mg at bedtime -Depakote was started and the dose changed to ER 750 mg at bedtime (dose was 1,250 mg at bedtime, but VA was elevated and the dose was adjusted down to address this).  -methimazole dose was changed to 5 mg  bid - for hyperthyroid  -propranolol was started and changed to ER 60 mg once daily  -amantadine was started, and the dose changed to 100 mg once daily, for EPS - Patient started on cogentin for EPS prophylaxis   Gradually, patient started adjusting to milieu.   Patient's care was discussed during the interdisciplinary team meeting every day during the hospitalization.    The patient denied having side effects to prescribed psychiatric medication.   The patient reports their target psychiatric symptoms of psychosis, disorganized thoughts, and paranoia, all had mild improvement  to the psychiatric medications, and the patient reports overall benefit other psychiatric hospitalization. Patient began sleeping more and became a bit more goal oriented by discharge. Staff reports that with psychiatric treatment, pt's mental status has significantly improved since admission.  Supportive psychotherapy was provided to the patient. The patient also participated in regular group therapy while admitted.    Labs were reviewed with the patient, and abnormal results were discussed with the patient.   The patient denied having suicidal thoughts more than 48 hours prior to discharge.  Patient denies having homicidal thoughts.  Patient denies having auditory hallucinations.  Patient denies any visual hallucinations.  Patient denies having paranoid thoughts.   The patient is able to verbalize their individual safety plan to this provider.   It is recommended to the patient to continue psychiatric medications as prescribed, after discharge from the hospital.     It is recommended to the patient to follow up with your outpatient psychiatric provider and PCP.   Discussed with the patient, the impact of alcohol, drugs, tobacco have been there overall psychiatric and medical wellbeing, and total abstinence from substance use was recommended the patient.  Musculoskeletal: Strength & Muscle Tone: within normal  limits Gait & Station: shuffle but can walk normal when she wants Patient leans: N/A  Psychiatric Specialty Exam  Presentation  General Appearance: Appropriate for Environment; Casual  Eye Contact:Good  Speech:Clear and Coherent  Speech Volume:Normal  Handedness:Right   Mood and Affect  Mood:-- (very happy)  Duration of Depression Symptoms: No data recorded Affect:Congruent   Thought Process  Thought Processes:Goal Directed  Descriptions of Associations:Tangential  Orientation:Full (Time, Place and Person)  Thought Content:Tangential (improved)  History of Schizophrenia/Schizoaffective disorder:Yes  Duration of Psychotic Symptoms:Greater than six months  Hallucinations:Hallucinations: None  Ideas of Reference:None  Suicidal Thoughts:Suicidal Thoughts: No  Homicidal Thoughts:Homicidal Thoughts: No   Sensorium  Memory:Immediate Fair  Judgment:Impaired  Insight:Shallow   Executive Functions  Concentration:Fair  Attention Span:Poor  [place] of Knowledge:Poor  Language:Fair   Psychomotor Activity  Psychomotor Activity:Psychomotor Activity: Normal   Assets  Assets:Resilience   Sleep  Sleep:Sleep: Good (7h)   Physical Exam: Physical Exam Constitutional:      Appearance: Normal appearance.  HENT:     Head: Normocephalic and atraumatic.  Pulmonary:     Effort: Pulmonary effort is normal.  Neurological:     Mental Status: She is alert and oriented to person, place, and time.   Review of Systems  Psychiatric/Behavioral:  Negative for depression, hallucinations and suicidal ideas.   Blood pressure 133/88, pulse 85, temperature (!) 97.4 F (36.3 C), temperature source Oral, resp. rate 18, height 5' (1.524 m), weight 76.7 kg, SpO2 99 %. Body mass index is 33.01 kg/m.  Mental Status Per Nursing Assessment::   On Admission:  NA  Demographic Factors:  Low socioeconomic status and Living alone  Loss  Factors: NA  Historical Factors: NA  Risk Reduction Factors:   Sense of responsibility to family and Positive social support  Continued Clinical Symptoms:  Schizoaffective  Cognitive Features That Contribute To Risk:  Thought constriction (tunnel vision)    Suicide Risk:  Minimal: No identifiable suicidal ideation.  Patients presenting with no risk factors but with morbid ruminations; may be classified as minimal risk based on the severity of the depressive symptoms   Follow-up [place]. Go on 01/06/2022.   Why: You have a hospital follow up appointment for primary care services on 01/06/22 at 9:15 am.  This appointment will be held in person. Contact information: [place] [phone] [place] Follow up.   Why: [facility] is to follow up with you at discharge. Contact information: [place] [address] [phone]                 Plan Of Care/Follow-up recommendations:  Follow up recommendations: - Activity as tolerated. - Diet as recommended by PCP. - Keep all scheduled follow-up appointments as recommended.    PGY-2 Freida Busman, MD 12/23/2021, 12:49 PM

## 2021-12-23 NOTE — Progress Notes (Signed)
°  [facility] Adult Case Management Discharge Plan :  Will you be returning to the same living situation after discharge:  Yes,  to home At discharge, do you have transportation home?: Yes,  friend to pick this pt up Do you have the ability to pay for your medications: Yes,  has insurance  Release of information consent forms completed and in the chart;  Patient's signature needed at discharge.  Patient to Follow up at:  Follow-up Information     [facility]. Go on 01/06/2022.   Why: You have a hospital follow up appointment for primary care services on 01/06/22 at 9:15 am.  This appointment will be held in person. Contact information: [place] [phone] [place] Follow up.   Why: [facility] is to follow up with you at discharge. Contact information: [place] [address] [phone]                 Next level of care provider has access to [place] and Suicide Prevention discussed: Yes,  with sister     Has patient been referred to the Quitline?: N/A patient is not a smoker  Patient has been referred for addiction treatment: Pt. refused referral  Vassie Moselle, [place] 12/23/2021, 10:28 AM

## 2021-12-23 NOTE — Group Note (Signed)
Recreation Therapy Group Note   Group Topic:Healthy Decision Making  Group Date: 12/23/2021 Start Time: 9191 End Time: 1038 Facilitators: [name], LRT,CTRS Location: [address] Dayroom   Goal Area(s) Addresses:  Patient will actively participate in stress management techniques presented during session.  Patient will successfully identify benefit of practicing stress management post d/c.   Group Description: Guided Imagery. LRT provided education, instruction, and demonstration on practice of visualization via guided imagery. Patient was asked to participate in the technique introduced during session. LRT debriefed including topics of mindfulness, stress management and specific scenarios each patient could use these techniques. Patients were given suggestions of ways to access scripts post d/c and encouraged to explore Youtube and other apps available on smartphones, tablets, and computers.   Affect/Mood: N/A   Participation Level: Did not attend    Clinical Observations/Individualized Feedback:     Plan: Continue to engage patient in RT group sessions 2-3x/week.   [name], Glennis Brink 12/23/2021 12:42 PM

## 2021-12-23 NOTE — BHH Counselor (Signed)
CSW spoke with Annie Main with [facility] who has agreed to see this patient when she arrives home and will be seeing her on a daily basis for the next 7 days and will reevaluate at that time for frequency of visits.     Darletta Moll MSW, LCSW Clincal Social Worker  [facility]

## 2021-12-23 NOTE — Progress Notes (Signed)
Pt refused medications, pt started to get confused and upset, but pt agreed to take PO medications and were given per [facility]

## 2021-12-23 NOTE — Group Note (Signed)
Date:  12/23/2021 Time:  4:07 PM  Group Topic/Focus:  Goals Group:   The focus of this group is to help patients establish daily goals to achieve during treatment and discuss how the patient can incorporate goal setting into their daily lives to aide in recovery.    Participation Level:  Active  Modes of Intervention:  Activity  Additional Comments:  Pt has a goal of getting her bills paid this month.   Tyrell Antonio Wells Gerdeman 12/23/2021, 4:07 PM

## 2021-12-23 NOTE — Plan of Care (Signed)
Patient was able to focus on tasks with prompts throughout recreation group sessions.  Patient progressed throughout treatment.  [name], LRT,CTRS

## 2021-12-23 NOTE — Progress Notes (Signed)
RN met with pt and reviewed pt's discharge instructions. Pt verbalized understanding of discharge instructions and pt did not have any questions. RN reviewed and provided pt with a copy of SRA, AVS and Transition Record. RN returned pt's belongings to pt.  Paper Prescriptions and medication samples were given to pt. Pt denied SI/HI/AVH and voiced no concerns. Pt was appreciative of the care pt received at [facility]. Patient discharged to the lobby without incident.

## 2021-12-23 NOTE — Progress Notes (Signed)
Recreation Therapy Notes  INPATIENT RECREATION TR PLAN  Patient Details Name: Paula Dickson MRN: [id number] DOB: Apr 13, 1966 Today's Date: 12/23/2021  Rec Therapy Plan Is patient appropriate for Therapeutic Recreation?: Yes Treatment times per week: about 3 days Estimated Length of Stay: 5-7 days TR Treatment/Interventions: Group participation (Comment)  Discharge Criteria Pt will be discharged from therapy if:: Discharged Treatment plan/goals/alternatives discussed and agreed upon by:: Patient/family  Discharge Summary Short term goals set: See patient care plan Short term goals met: Adequate for discharge Progress toward goals comments: Groups attended Which groups?: Self-esteem, Coping skills, Wellness, Leisure education, Goal setting, Other (Comment) (Triggers; Self Expression; Team Building; [name]) Reason goals not met: Pt needed multiple prompts to get on task. Therapeutic equipment acquired: N/A Reason patient discharged from therapy: Discharge from hospital Pt/family agrees with progress & goals achieved: Yes Date patient discharged from therapy: 12/23/21    [name], Vickki Muff, Leanne Sisler A 12/23/2021, 1:44 PM

## 2021-12-23 NOTE — Progress Notes (Signed)
°   12/23/21 0500  Sleep  Number of Hours 7

## 2021-12-23 NOTE — Discharge Summary (Addendum)
Physician Discharge Summary Note  Patient:  Paula Dickson is an 56 y.o., female MRN:  [id number] DOB:  14-Feb-1966 Patient phone:  [phone] (home)  Patient address:   [address] [place] [address],  Total Time spent with patient: 15 minutes  Date of Admission:  11/23/2021 Date of Discharge: 12/23/2021  Reason for Admission:  Paula Dickson is a 56 yr old female who presents with bizarre behaviors and nonsensical speech after medication noncompliance.   Principal Problem: Schizoaffective disorder, bipolar type [facility]) Discharge Diagnoses: Principal Problem:   Schizoaffective disorder, bipolar type ([place]) Active Problems:   Hyperthyroidism   Benign essential HTN   AKI (acute kidney injury) ([place])   Cognitive dysfunction in chronic schizophrenia ([place])   Past Psychiatric History:  Previous Psych Diagnoses: Schizoaffective disorder, bipolar type Prior inpatient treatment: Yes, multiple admissions here at [facility] and other facilities Current/prior outpatient treatment: Received treatment at [facility] Prior rehab hx: Denies History of suicide: Yes SA via OD in 2018 History of homicide: Denies Psychiatric medication history: Tegretol 150 mg TID, Haldol 10 mg, Restoril 30 mg, Clozapine 25 mg qAM and 100 mg qHS; from 01/2021 note: discharged from [facility] less than 3 weeks ago on risperidone, olanzapine, carbamazepine, Restoril, trazodone, and Geodon. Psychiatric medication compliance history: Noncompliant Neuromodulation history: Denies Current Psychiatrist: Biochemist, clinical, NP Current therapist: Denies  Past Medical History:  Past Medical History:  Diagnosis Date   Bipolar affective disorder ([place])    Bipolar disorder ([place])    History of arthritis    History of chicken pox    History of depression    History of genital warts    history of heart murmur    History of high blood pressure    History of thyroid disease    History of UTI    Hypertension    Low TSH level 07/13/2017    Schizophrenia ([place])     Past Surgical History:  Procedure Laterality Date   ABLATION ON ENDOMETRIOSIS     CYST REMOVAL NECK     around 11 years ago /benign   MULTIPLE TOOTH EXTRACTIONS     Family History:  Family History  Problem Relation Age of Onset   Arthritis Father    Hyperlipidemia Father    High blood pressure Father    Diabetes Sister    Diabetes Mother    Diabetes Brother    Mental illness Brother    Alcohol abuse Paternal Uncle    Alcohol abuse Paternal Grandfather    Breast cancer Maternal Aunt    Breast cancer Paternal Aunt    High blood pressure Sister    Mental illness Other        runs in family   Family Psychiatric  History: Mental illness "runs in the family" per EMR Social History:  Social History   Substance and Sexual Activity  Alcohol Use Not Currently     Social History   Substance and Sexual Activity  Drug Use Not Currently    Social History   Socioeconomic History   Marital status: Single    Spouse name: Not on file   Number of children: Not on file   Years of education: Not on file   Highest education level: Patient refused  Occupational History   Not on file  Tobacco Use   Smoking status: Never   Smokeless tobacco: Never  Vaping Use   Vaping Use: Never used  Substance and Sexual Activity   Alcohol use: Not Currently   Drug use: Not  Currently   Sexual activity: Not Currently  Other Topics Concern   Not on file  Social History Narrative   ** Merged History Encounter **       Social Determinants of Health   Financial Resource Strain: Not on file  Food Insecurity: Not on file  Transportation Needs: Not on file  Physical Activity: Not on file  Stress: Not on file  Social Connections: Not on file    Hospital Course:  Paula Dickson is a 56 year old female with a longstanding psychiatric history of schizoaffective disorder- bipolar type complicated by medication noncompliance and previous suicide attempt via OD who was admitted  voluntarily from [facility] for bizarre behaviors and nonsensical speech after medication noncompliance. While in the ED, she was given Geodon x1 on 12/27, Ativan x1 when refusing meds and unable to de-escalate on 12/28.  Psychiatric Treatment During her stay patient was started on Geodon and her home Zyprexa was discontinued. It appeared that patient had failed Zyprexa, based on her presentation. Collateral confirmed that patient had been going to her OP appts and was likely complaint with her Zyprexa. There was also concern for increased anticholinergic sensitivity on her cognition, thus throughout her hospitalization medications with less anticholinergic effects were optimized. Patient was also started on Restoril for insomnia. Patient continued to have disorganization, tangentiality, and some unstable mood with overall bizarre presentation and insomnia. Patient's Geodon wa titrated up as was her Remeron. Despite this patient continued to have poor sleep times. Patient also continued to require PRN Zyprexa for her behavior on the unit. Due to this patient was started on Risperdal. There was concern that patient required a 2nd antipsychotic with strong dopamine-2  (r) affinity, but would require less blood work for patient OP. Unfortunately patient appeared to require a more sedating antipsychotic as she was still having significant insomnia despite regimen. Patient was noted to do fairly well on questions assessing her general memory and cognition, thus it was decided that patient would be restarted on Zyprexa as her 2nd antipsychotic with Geodon. Patient was also started on Depakote to assist with the mood lability and sedation. Patient was started on amantadine (in an effort to decrease anticholinergic medications) to assist with EPS prophylaxis. Patient was titrated up on Zyprexa. Patient refused the new Zyprexa and Depakote despite initially agreeing to the medication. Patient became very agitated, and her  thought process changed from tangential to completely disorganized with word salad. Patient was consequently IVC'd and placed under a forced med order. Patient began receiving her medications and became more pleasant. Unfortunately patient's Depakote level resulted elevated and patient also appeared a bit more encephalopathic. Depakote was decreased and patient again returned to her tangential, disorganization.   Patient was interviewed by ACT during her hospitalization when patient was noted to begin improving and was accepted.  Unfortunately patient had a sudden downward trend where she began refusing medications and her forced med orders had to be continued 3 times total throughout her hospitalization.  It was ultimately decided that patient's Geodon was not sufficient.  Geodon was discontinued and patient was started on a combination of Haldol and Zyprexa.  Patient's Haldol was titrated up.  Patient required IM medications multiple times and missed several doses of some p.o. medications that were not available in IM form.  Initially patient was only receiving IM alternative Haldol however patient continued to have poor compliance and began receiving both IM Haldol and IM Zyprexa. Staff and providers focused on patient receiving antipsychotic medication and  EPS prophylaxis.  Patient was not compliant with any of her medications for sleep or mood dysregulation.  Patient did begin to have a  tremor.  Patient was started on Cogentin and her amantadine was readjusted back to daily medication an effort to ensure the patient received anticholinergic medication in either PO or  IM form regardless of patient's compliance.  There was an attempt to start patient on Clozaril however, as patient was being noncompliant with p.o. medications it was decided that this was no longer an option.  Patient appeared to be preoccupied with the thought that her medications are not appropriate as it did not match the medication list  she had prior to hospitalization.  There are significant redirection patient eventually became compliant with p.o. medications and labs are able to be drawn.  Patient was still overall disorganized and tangential however she was more goal-directed in her thought content when she was compliant with her medication.  Patient became more pleasant and compliant and her labs to be drawn.  At time of discharge patient's labs are noted to have significant improvement regarding her creatinine and Depakote level becoming therapeutic.  Patient consistently denied SI, HI and AVH for at least 48 hours prior to discharge.  Patient was able to interact well with her peers in the milieu.   During the time patient was not compliant with medication she began sleeping less.  Unfortunately even on nights when patient was compliant with her Restoril patient was still not sleeping.  Patient was started on Ambien and Restoril was discontinued.  Patient was noted to sleep better on this medication and by discharge was sleeping 7 hours at night. At time of discharge patient endorsed intent to remain compliant with her medications and was denying SI, HI and AVH for the last 48 hours.  At this time patient appears to have remains fairly stable over the past 48 hrs and there is unlikely to be a significant change. Patient will be discharged on the current regimen. Prior to discharge patient's ACT acted and endorsed that he would see patient day of discharge and continue to see her daily.   Medical Treatment Initially patient's Methimazole was continued. During her hospitalization, patient's TSH was noted to increase from 6.847>12.328. Hospitalist was consulted who recommended that patient's Methimazole be held and her TSH be re evaluated at a later date. Patient 's TSH was checked 3 days later and had significantly fallen in a short time span, therefore her Methimazole was restarted but at a lower dose.  At discharge patient's TSH was  5.034.  Patient was recommended to f/u OP with her PCP.  Patient was also noted to have an AKI on admission. Hospitalist recommended that patient have increased PO intake and her Cr be monitored. Patient was able to increase her PO intake and throughout hospitalization as she improved, appeared to have some level of understanding that her kidney function was sub-optimal and was intentional about her PO intake. Patient's Cr fluctuated throughout her hospitalization, but patient never required transfer to ED for IV fluids and her Cr decreased and stabilized by discharge. At discharge it was 1.06. 2/2 to patient's AKI her losartan was held and not continued at discharge. In response to this discontinuation patient's home propanolol was increased to TID. Patient is recommended to f/u OP with her PCP.    Physical Findings: AIMS: Facial and Oral Movements Muscles of Facial Expression: None, normal Lips and Perioral Area: None, normal Jaw: None, normal Tongue: None, normal,Extremity  Movements Upper (arms, wrists, hands, fingers): Mild Lower (legs, knees, ankles, toes): None, normal, Trunk Movements Neck, shoulders, hips: None, normal, Overall Severity Severity of abnormal movements (highest score from questions above): None, normal Incapacitation due to abnormal movements: None, normal Patient's awareness of abnormal movements (rate only patient's report): No Awareness, Dental Status Current problems with teeth and/or dentures?: No Does patient usually wear dentures?: No  CIWA:    COWS:     Musculoskeletal: Strength & Muscle Tone: within normal limits Gait & Station:  Steady Patient leans: N/A   Psychiatric Specialty Exam:  Presentation  General Appearance: Appropriate for Environment; Casual  Eye Contact:Good  Speech:Clear and Coherent  Speech Volume:Normal  Handedness:Right   Mood and Affect  Mood:-- (very happy)  Affect:Congruent   Thought Process  Thought Processes:Goal  Directed  Descriptions of Associations:Tangential  Orientation:Full (Time, Place and Person)  Thought Content:Tangential (improved)  History of Schizophrenia/Schizoaffective disorder:Yes  Duration of Psychotic Symptoms:Greater than six months  Hallucinations:Hallucinations: None  Ideas of Reference:None  Suicidal Thoughts:Suicidal Thoughts: No  Homicidal Thoughts:Homicidal Thoughts: No   Sensorium  Memory:Immediate Fair  Judgment:Impaired  Insight:Shallow   Executive Functions  Concentration:Fair  Attention Span:Poor  [place] of Knowledge:Poor  Language:Fair   Psychomotor Activity  Psychomotor Activity:Psychomotor Activity: Normal   Assets  Assets:Resilience   Sleep  Sleep:Sleep: Good (7h)    Physical Exam: Physical Exam HENT:     Head: Normocephalic and atraumatic.  Pulmonary:     Effort: Pulmonary effort is normal.  Neurological:     Mental Status: She is alert and oriented to person, place, and time.   Review of Systems  Psychiatric/Behavioral:  Negative for depression, hallucinations and suicidal ideas. The patient does not have insomnia.   Blood pressure 133/88, pulse 85, temperature (!) 97.4 F (36.3 C), temperature source Oral, resp. rate 18, height 5' (1.524 m), weight 76.7 kg, SpO2 99 %. Body mass index is 33.01 kg/m.   Social History   Tobacco Use  Smoking Status Never  Smokeless Tobacco Never   Tobacco Cessation:  N/A, patient does not currently use tobacco products   Blood Alcohol level:  Lab Results  Component Value Date   ETH <10 11/22/2021   ETH <10 44/01/4741    Metabolic Disorder Labs:  Lab Results  Component Value Date   HGBA1C 6.1 (H) 11/24/2021   MPG 128.37 11/24/2021   MPG 119.76 03/28/2021   Lab Results  Component Value Date   PROLACTIN 11.0 03/14/2019   Lab Results  Component Value Date   CHOL 127 11/24/2021   TRIG 161 (H) 11/24/2021   HDL 39 (L) 11/24/2021   CHOLHDL 3.3 11/24/2021    VLDL 32 11/24/2021   LDLCALC 56 11/24/2021   [place] 95 10/26/2020    See Psychiatric Specialty Exam and Suicide Risk Assessment completed by Attending Physician prior to discharge.  Discharge destination:  Home  Is patient on multiple antipsychotic therapies at discharge:  Yes,   Do you recommend tapering to monotherapy for antipsychotics?  No   Has Patient had three or more failed trials of antipsychotic monotherapy by history:  Yes,   Antipsychotic medications that previously failed include:   1.  Geodon 2. Risperdal 3. Clozaril.  Recommended Plan for Multiple Antipsychotic Therapies: Additional reason(s) for multiple antispychotic treatment:  Patient has  schizoaffective disorder that is refractory to monotherapy at this time and has hx of poor compliance with Clozaril resulting in rehospitalization. Patient improved best with combination therapy. Patient appears more emotional stable  and behavior is less bizarre and is not endorsing SI, HI and AVH with dual antipsychotic therapy.    Allergies as of 12/23/2021   No Known Allergies      Medication List     STOP taking these medications    LORazepam 1 MG tablet Commonly known as: ATIVAN   losartan 25 MG tablet Commonly known as: COZAAR   propranolol 20 MG tablet Commonly known as: INDERAL Replaced by: propranolol ER 60 MG 24 hr capsule       TAKE these medications      Indication  amantadine 100 MG capsule Commonly known as: SYMMETREL Take 1 capsule (100 mg total) by mouth daily. Start taking on: December 24, 2021  Indication: Tremors   benztropine 1 MG tablet Commonly known as: COGENTIN Take 1 tablet (1 mg total) by mouth 2 (two) times daily.  Indication: Extrapyramidal Reaction caused by Medications   divalproex 250 MG 24 hr tablet Commonly known as: DEPAKOTE ER Take 3 tablets (750 mg total) by mouth daily with supper.  Indication: Mood dysregulation   haloperidol 10 MG tablet Commonly known as:  HALDOL Take 1 tablet (10 mg total) by mouth at bedtime.  Indication: Schizophrenia   haloperidol 5 MG tablet Commonly known as: HALDOL Take 1 tablet (5 mg total) by mouth daily. Start taking on: December 24, 2021  Indication: Schizophrenia   Melatonin 10 MG Tabs Take 10 mg by mouth at bedtime.  Indication: Trouble Sleeping   methimazole 5 MG tablet Commonly known as: TAPAZOLE Take 1 tablet (5 mg total) by mouth 2 (two) times daily. What changed:  medication strength how much to take  Indication: Overactive Thyroid Gland   OLANZapine 10 MG tablet Commonly known as: ZYPREXA Take 10 mg by mouth at bedtime.  Indication: Schizophrenia   pantoprazole 20 MG tablet Commonly known as: PROTONIX Take 20 mg by mouth daily.  Indication: Gastroesophageal Reflux Disease   propranolol ER 60 MG 24 hr capsule Commonly known as: INDERAL LA Take 1 capsule (60 mg total) by mouth daily. Start taking on: December 24, 2021 Replaces: propranolol 20 MG tablet  Indication: Feeling Anxious   zolpidem 10 MG tablet Commonly known as: AMBIEN Take 1 tablet (10 mg total) by mouth at bedtime.  Indication: [place]. Go on 01/06/2022.   Why: You have a hospital follow up appointment for primary care services on 01/06/22 at 9:15 am.  This appointment will be held in person. Contact information: [place] [id number] [place] Follow up.   Why: ACTT is to follow up with you at discharge. Contact information: [place] [address] [phone]                 Follow-up recommendations:  Follow up recommendations: - Activity as tolerated. - Diet as recommended by PCP. - Keep all scheduled follow-up appointments as recommended.   Comments:  Patient is instructed to take all prescribed medications  as recommended. Report any side effects or adverse reactions to your outpatient psychiatrist. Patient is instructed to abstain from alcohol and illegal drugs while on prescription medications. In the event of worsening symptoms, patient is instructed to call the crisis hotline, 911, or go to the nearest emergency department for evaluation and treatment.    Signed:  PGY-2 Mila Palmer  Candie Chroman, MD 12/23/2021, 3:40 PM

## 2021-12-28 NOTE — [facility] Group Notes (Signed)
Spirituality group facilitated by Kathrynn Humble, [place].   Group Description: Group focused on topic of hope. Patients participated in facilitated discussion around topic, connecting with one another around experiences and definitions for hope. Group members engaged with visual explorer photos, reflecting on what hope looks like for them today. Group engaged in discussion around how their definitions of hope are present today in hospital.   Modalities: Psycho-social ed, [place], Narrative, MI   Patient Progress: Paula Dickson attended group.  She was self-reflective about her tendency to interrupt and tried to wait until others were finished speaking before she spoke.    [place], [place] Pager, [phone] 10:36 AM

## 2021-12-28 NOTE — Social Work (Signed)
CSW received call rom [place] caseworker Ike Bene who is following case. [name] was seeking information as to whereabouts of Pt. CSW informed APS caseworker that Pt had been d/c'ed.

## 2022-01-06 ENCOUNTER — Inpatient Hospital Stay (INDEPENDENT_AMBULATORY_CARE_PROVIDER_SITE_OTHER): Payer: Self-pay | Admitting: Primary Care

## 2022-02-03 ENCOUNTER — Ambulatory Visit: Payer: Medicare Other | Admitting: Podiatry

## 2022-02-22 ENCOUNTER — Emergency Department (HOSPITAL_COMMUNITY)
Admission: EM | Admit: 2022-02-22 | Discharge: 2022-02-24 | Disposition: A | Discharge status: Other Acute Inpatient Hospital | Payer: Medicare Other | Attending: Emergency Medicine | Admitting: Emergency Medicine

## 2022-02-22 ENCOUNTER — Other Ambulatory Visit: Payer: Self-pay

## 2022-02-22 DIAGNOSIS — F29 Unspecified psychosis not due to a substance or known physiological condition: Secondary | ICD-10-CM | POA: Insufficient documentation

## 2022-02-22 DIAGNOSIS — Z046 Encounter for general psychiatric examination, requested by authority: Secondary | ICD-10-CM | POA: Diagnosis present

## 2022-02-22 DIAGNOSIS — F3010 Manic episode without psychotic symptoms, unspecified: Secondary | ICD-10-CM

## 2022-02-22 DIAGNOSIS — F250 Schizoaffective disorder, bipolar type: Secondary | ICD-10-CM | POA: Diagnosis present

## 2022-02-22 DIAGNOSIS — Z79899 Other long term (current) drug therapy: Secondary | ICD-10-CM | POA: Diagnosis not present

## 2022-02-22 DIAGNOSIS — Y900 Blood alcohol level of less than 20 mg/100 ml: Secondary | ICD-10-CM | POA: Insufficient documentation

## 2022-02-22 DIAGNOSIS — Z20822 Contact with and (suspected) exposure to covid-19: Secondary | ICD-10-CM | POA: Diagnosis not present

## 2022-02-22 DIAGNOSIS — F309 Manic episode, unspecified: Secondary | ICD-10-CM | POA: Insufficient documentation

## 2022-02-22 LAB — COMPREHENSIVE METABOLIC PANEL
ALT: 19 U/L (ref 0–44)
AST: 16 U/L (ref 15–41)
Albumin: 4.8 g/dL (ref 3.5–5.0)
Alkaline Phosphatase: 75 U/L (ref 38–126)
Anion gap: 12 (ref 5–15)
BUN: 16 mg/dL (ref 6–20)
CO2: 26 mmol/L (ref 22–32)
Calcium: 10.2 mg/dL (ref 8.9–10.3)
Chloride: 105 mmol/L (ref 98–111)
Creatinine, Ser: 1.1 mg/dL — ABNORMAL HIGH (ref 0.44–1.00)
GFR, Estimated: 58 mL/min — ABNORMAL LOW (ref >60)
Glucose, Bld: 101 mg/dL — ABNORMAL HIGH (ref 70–99)
Potassium: 3.7 mmol/L (ref 3.5–5.1)
Sodium: 143 mmol/L (ref 135–145)
Total Bilirubin: 0.5 mg/dL (ref 0.3–1.2)
Total Protein: 8.3 g/dL — ABNORMAL HIGH (ref 6.5–8.1)

## 2022-02-22 LAB — URINALYSIS, ROUTINE W REFLEX MICROSCOPIC
Bilirubin Urine: NEGATIVE
Glucose, UA: NEGATIVE mg/dL
Hgb urine dipstick: NEGATIVE
Ketones, ur: 20 mg/dL — AB
Nitrite: NEGATIVE
Protein, ur: NEGATIVE mg/dL
Specific Gravity, Urine: 1.013 (ref 1.005–1.030)
pH: 6 (ref 5.0–8.0)

## 2022-02-22 LAB — CBC WITH DIFFERENTIAL/PLATELET
Abs Immature Granulocytes: 0.01 10*3/uL (ref 0.00–0.07)
Basophils Absolute: 0.1 10*3/uL (ref 0.0–0.1)
Basophils Relative: 1 %
Eosinophils Absolute: 0.1 10*3/uL (ref 0.0–0.5)
Eosinophils Relative: 1 %
HCT: 47.2 % — ABNORMAL HIGH (ref 36.0–46.0)
Hemoglobin: 15 g/dL (ref 12.0–15.0)
Immature Granulocytes: 0 %
Lymphocytes Relative: 32 %
Lymphs Abs: 2.4 10*3/uL (ref 0.7–4.0)
MCH: 27.9 pg (ref 26.0–34.0)
MCHC: 31.8 g/dL (ref 30.0–36.0)
MCV: 87.7 fL (ref 80.0–100.0)
Monocytes Absolute: 0.7 10*3/uL (ref 0.1–1.0)
Monocytes Relative: 10 %
Neutro Abs: 4.3 10*3/uL (ref 1.7–7.7)
Neutrophils Relative %: 56 %
Platelets: 248 10*3/uL (ref 150–400)
RBC: 5.38 MIL/uL — ABNORMAL HIGH (ref 3.87–5.11)
RDW: 13.8 % (ref 11.5–15.5)
WBC: 7.5 10*3/uL (ref 4.0–10.5)
nRBC: 0 % (ref 0.0–0.2)

## 2022-02-22 LAB — ETHANOL: Alcohol, Ethyl (B): <10 mg/dL (ref <10)

## 2022-02-22 LAB — RAPID URINE DRUG SCREEN, HOSP PERFORMED
Amphetamines: NOT DETECTED
Barbiturates: NOT DETECTED
Benzodiazepines: NOT DETECTED
Cocaine: NOT DETECTED
Opiates: NOT DETECTED
Tetrahydrocannabinol: NOT DETECTED

## 2022-02-22 LAB — RESP PANEL BY RT-PCR (FLU A&B, COVID) ARPGX2
Influenza A by PCR: NEGATIVE
Influenza B by PCR: NEGATIVE
SARS Coronavirus 2 by RT PCR: NEGATIVE

## 2022-02-22 MED ORDER — ZOLPIDEM TARTRATE 5 MG PO TABS
5.0000 mg | ORAL_TABLET | Freq: Every evening | ORAL | Status: DC | PRN
  Filled 2022-02-22: qty 1

## 2022-02-22 MED ORDER — ZOLPIDEM TARTRATE 10 MG PO TABS: 10.0000 mg | ORAL_TABLET | Freq: Every evening | ORAL | Status: DC | PRN

## 2022-02-22 MED ORDER — DIVALPROEX SODIUM ER 500 MG PO TB24: 750.0000 mg | ORAL_TABLET | Freq: Every day | ORAL | Status: DC

## 2022-02-22 MED ORDER — HALOPERIDOL 5 MG PO TABS
10.0000 mg | ORAL_TABLET | Freq: Every day | ORAL | Status: DC
  Filled 2022-02-22 (×3): qty 2

## 2022-02-22 MED ORDER — ZIPRASIDONE MESYLATE 20 MG IM SOLR
10.0000 mg | Freq: Once | INTRAMUSCULAR | Status: AC
  Administered 2022-02-22: 10 mg via INTRAMUSCULAR
  Filled 2022-02-22: qty 20

## 2022-02-22 MED ORDER — HALOPERIDOL 5 MG PO TABS
5.0000 mg | ORAL_TABLET | Freq: Every day | ORAL | Status: DC
  Filled 2022-02-22 (×2): qty 1

## 2022-02-22 MED ORDER — STERILE WATER FOR INJECTION IJ SOLN
INTRAMUSCULAR | Status: AC
  Administered 2022-02-22: 10 mL
  Filled 2022-02-22: qty 10

## 2022-02-22 MED ORDER — BENZTROPINE MESYLATE 1 MG PO TABS
1.0000 mg | ORAL_TABLET | Freq: Two times a day (BID) | ORAL | Status: DC
  Filled 2022-02-22: qty 1
  Filled 2022-02-22: qty 2

## 2022-02-22 MED ORDER — CEPHALEXIN 500 MG PO CAPS
1000.0000 mg | ORAL_CAPSULE | Freq: Two times a day (BID) | ORAL | Status: DC
  Filled 2022-02-22 (×5): qty 2

## 2022-02-22 MED ORDER — MELATONIN 5 MG PO TABS
10.0000 mg | ORAL_TABLET | Freq: Every day | ORAL | Status: DC
  Filled 2022-02-22 (×3): qty 2

## 2022-02-22 MED ORDER — PROPRANOLOL HCL ER 60 MG PO CP24
60.0000 mg | ORAL_CAPSULE | Freq: Every day | ORAL | Status: DC
  Filled 2022-02-22 (×2): qty 1

## 2022-02-22 MED ORDER — METHIMAZOLE 5 MG PO TABS
5.0000 mg | ORAL_TABLET | Freq: Two times a day (BID) | ORAL | Status: DC
  Filled 2022-02-22 (×5): qty 1

## 2022-02-22 NOTE — ED Notes (Signed)
I attempted to provide Haldol to pt per order, pt did not acknowledge my instructions and did not even regard that I was giving it to her. Pt did not swallow tablet, and did not consume apple sauce in which I crushed the haldol in. ?

## 2022-02-22 NOTE — ED Provider Notes (Signed)
?[facility] DEPT ?Provider Note ? ? ?CSN: [id number] ?Arrival date & time: 02/22/22  1234 ? ?  ? ?History ? ?Chief Complaint  ?Patient presents with  ? Manic Behavior  ? ? ?Paula Dickson is a 56 y.o. female. ? ?Patient does not provide any meaningful history due to her current psychiatric state. ? ?Called sister, emergency contact and attempt to obtain more history but no answer. ? ?History obtained from EMS report, patient reportedly noncompliant with her psych medications and having erratic behavior.  History of bipolar disorder, schizophrenia. ? ?Reviewed history from 12/23/2021 ? ?HPI ? ?  ? ?Home Medications ?Prior to Admission medications   ?Medication Sig Start Date End Date Taking? Authorizing Provider  ?amantadine (SYMMETREL) 100 MG capsule Take 1 capsule (100 mg total) by mouth daily. 12/24/21   Freida Busman, MD  ?benztropine (COGENTIN) 1 MG tablet Take 1 tablet (1 mg total) by mouth 2 (two) times daily. 12/23/21   Freida Busman, MD  ?divalproex (DEPAKOTE ER) 250 MG 24 hr tablet Take 3 tablets (750 mg total) by mouth daily with supper. 12/23/21   Freida Busman, MD  ?haloperidol (HALDOL) 10 MG tablet Take 1 tablet (10 mg total) by mouth at bedtime. 12/23/21   Freida Busman, MD  ?haloperidol (HALDOL) 5 MG tablet Take 1 tablet (5 mg total) by mouth daily. 12/24/21   Freida Busman, MD  ?melatonin 10 MG TABS Take 10 mg by mouth at bedtime. 12/23/21   Freida Busman, MD  ?methimazole (TAPAZOLE) 5 MG tablet Take 1 tablet (5 mg total) by mouth 2 (two) times daily. 12/23/21   Freida Busman, MD  ?OLANZapine (ZYPREXA) 10 MG tablet Take 10 mg by mouth at bedtime. 11/04/21   [provider]  ?pantoprazole (PROTONIX) 20 MG tablet Take 20 mg by mouth daily. 11/07/21   [provider]  ?propranolol ER (INDERAL LA) 60 MG 24 hr capsule Take 1 capsule (60 mg total) by mouth daily. 12/24/21   Freida Busman, MD  ?zolpidem (AMBIEN) 10 MG tablet Take 1 tablet (10 mg  total) by mouth at bedtime. 12/23/21   Freida Busman, MD  ?   ? ?Allergies    ?Patient has no known allergies.   ? ?Review of Systems   ?Review of Systems  ?Unable to perform ROS: Psychiatric disorder  ? ?Physical Exam ?Updated Vital Signs ?BP (!) 140/106 (BP Location: Left Arm)   Pulse (!) 116   Temp 98.5 ?F (36.9 ?C) (Oral)   Resp 18   SpO2 96%  ?Physical Exam ?Vitals and nursing note reviewed.  ?Constitutional:   ?   General: She is not in acute distress. ?   Appearance: She is well-developed.  ?HENT:  ?   Head: Normocephalic and atraumatic.  ?Eyes:  ?   Conjunctiva/sclera: Conjunctivae normal.  ?Cardiovascular:  ?   Rate and Rhythm: Normal rate and regular rhythm.  ?   Heart sounds: No murmur heard. ?Pulmonary:  ?   Effort: Pulmonary effort is normal. No respiratory distress.  ?   Breath sounds: Normal breath sounds.  ?Abdominal:  ?   Palpations: Abdomen is soft.  ?   Tenderness: There is no abdominal tenderness.  ?Musculoskeletal:     ?   General: No swelling.  ?   Cervical back: Neck supple.  ?Skin: ?   General: Skin is warm and dry.  ?   Capillary Refill: Capillary refill takes less than 2 seconds.  ?Neurological:  ?  General: No focal deficit present.  ?   Mental Status: She is alert.  ?Psychiatric:  ?   Comments: Patient has disordered speech, pressured speech, she denies hallucinations or SI/HI  ? ? ?ED Results / Procedures / Treatments   ?Labs ?(all labs ordered are listed, but only abnormal results are displayed) ?Labs Reviewed  ?RESP PANEL BY RT-PCR (FLU A&B, COVID) ARPGX2  ?COMPREHENSIVE METABOLIC PANEL  ?ETHANOL  ?RAPID URINE DRUG SCREEN, HOSP PERFORMED  ?CBC WITH DIFFERENTIAL/PLATELET  ?URINALYSIS, ROUTINE W REFLEX MICROSCOPIC  ? ? ?EKG ?EKG Interpretation ? ?Date/Time:  Wednesday February 22 2022 14:28:12 EDT ?Ventricular Rate:  95 ?PR Interval:  162 ?QRS Duration: 84 ?QT Interval:  360 ?QTC Calculation: 452 ?R Axis:   -1 ?Text Interpretation: Normal sinus rhythm Minimal voltage criteria for LVH,  may be normal variant ( R in aVL ) T wave abnormality, consider anterior ischemia Abnormal ECG When compared with ECG of 19-Dec-2021 15:33, PREVIOUS ECG IS PRESENT Confirmed by Madalyn Rob [phone]) on 02/22/2022 3:15:31 PM ? ?Radiology ?No results found. ? ?Procedures ?Procedures  ? ? ?Medications Ordered in ED ?Medications  ?benztropine (COGENTIN) tablet 1 mg (has no administration in time range)  ?divalproex (DEPAKOTE ER) 24 hr tablet 750 mg (has no administration in time range)  ?haloperidol (HALDOL) tablet 10 mg (has no administration in time range)  ?haloperidol (HALDOL) tablet 5 mg (5 mg Oral Patient Refused/Not Given 02/22/22 1517)  ?Melatonin TABS 10 mg (has no administration in time range)  ?methimazole (TAPAZOLE) tablet 5 mg (5 mg Oral Patient Refused/Not Given 02/22/22 1517)  ?propranolol ER (INDERAL LA) 24 hr capsule 60 mg (has no administration in time range)  ?zolpidem (AMBIEN) tablet 5 mg (has no administration in time range)  ? ? ?ED Course/ Medical Decision Making/ A&P ?  ?                        ?Medical Decision Making ?Amount and/or Complexity of Data Reviewed ?Labs: ordered. ? ?Risk ?OTC drugs. ?Prescription drug management. ? ? ?56 year old lady presenting to ER due to concern for erratic behavior, medication noncompliance.  On my assessment, patient was noted to have pressured, erratic speech, but was calm and cooperative.  Unfortunately unable to reach family via phone.  History obtained from EMS report.  Seemingly similar presentation in December when she required admission to psych unit.  She also had AKI at that time.   ? ?Have placed med clearance orders for basic labs.  Have ordered her home medications. ? ?While awaiting labs, TTS eval, will sign out to Dr. Ashok Cordia. ? ? ? ? ? ? ? ?Final Clinical Impression(s) / ED Diagnoses ?Final diagnoses:  ?Manic behavior ([place])  ?Psychosis, unspecified psychosis type ([place])  ? ? ?Rx / DC Orders ?ED Discharge Orders   ? ? None  ? ?  ? ? ?  ?Lucrezia Starch, MD ?02/22/22 1525 ? ?

## 2022-02-22 NOTE — ED Notes (Signed)
Pt refuses to take night time meds, pt plays with meds and rambles about random things, flight of ideas, stating that she is not a whore, continues to say '5mg'$ , '5mg'$  and laugh inappropriately  ?

## 2022-02-22 NOTE — ED Triage Notes (Signed)
Pt from home, family called EMS d/t patient being noncompliant with psych meds and erratic behavior. Denies SI/HI. Hx bipolar disorder, schizophrenia. VS [facility] ?

## 2022-02-23 ENCOUNTER — Ambulatory Visit: Payer: Medicare Other | Admitting: Podiatry

## 2022-02-23 MED ORDER — HALOPERIDOL LACTATE 5 MG/ML IJ SOLN: 2.0000 mg | Freq: Once | INTRAMUSCULAR | Status: DC | PRN

## 2022-02-23 NOTE — BH Assessment (Addendum)
Comprehensive Clinical Assessment (CCA) Note ? ?02/23/2022 ?Paula Dickson ?[id number] ? ?Disposition: Per Merlyn Lot, NP, patient meets criteria for inpatient psychiatric treatment. Disposition Social Worker/Counselor to seek appropriate placement.  ? ?[facility] ED from 02/22/2022 in [facility] DEPT Admission (Discharged) from 11/23/2021 in [facility] ED from 11/22/2021 in [facility] DEPT  ?C-SSRS RISK CATEGORY No Risk No Risk No Risk  ? ?  ? ? ?Chief Complaint:  ?Chief Complaint  ?Patient presents with  ? Manic Behavior  ? Psychiatric Evaluation  ? ?Visit Diagnosis: Schizoaffective disorder, bipolar type ([place]) ? ?Pt is a 56 year old female presenting to [facility]. Upon chart review the EDP notes: "Patient does not provide any meaningful history due to her current psychiatric state. History obtained from EMS report, patient reportedly noncompliant with her psych medications and having erratic behavior.  History of bipolar disorder, schizophrenia." ?Pt was unable to be assessed due to currently psychotic, information obtained via ED notes.   ? ?Pt was labile during TTS consult and spoke in word salad. Pt was delusional (religious) and reports repeating the same verbiage throughout today's assessment: "1925, 1970, ?Daddy, Mama, 66,  Brown, orange, red, grey?Marland Kitchen  ?Patient also saying bible versus. Pt appeared to be responding to internal stimuli and was unable to be redirected at times. When patient is asked questions, she would refuse to answer. ? ?Dispositioned with Merlyn Lot, NP who recommended Inpatient. AC reviewing bed status, if no available beds disposition SW to find placement in the morning. ? ?CCA Screening, Triage and Referral (STR) ? ?Patient Reported Information ?How did you hear about [place]? Family/Friend ? ?What Is the Reason for Your Visit/Call Today? Paula Dickson is a 56 y.o. female. Patient  does not provide any meaningful history due to her current psychiatric state.     Called sister, emergency contact and attempt to obtain more history but no answer.     History obtained from EMS report, patient reportedly noncompliant with her psych medications and having erratic behavior.  History of bipolar disorder, schizophrenia. ? ?How Long Has This Been Causing You Problems? > than 6 months ? ?What Do You Feel Would Help You the Most Today? Medication(s) ? ? ?Have You Recently Had Any Thoughts About Hurting Yourself? -- (unknown; patient is unable to answer questions appropriately due to her current mental state.) ? ?Are You Planning to Commit Suicide/Harm Yourself At This time? -- (unknown; patient is unable to answer questions appropriately due to her current mental state.) ? ? ?Have you Recently Had Thoughts About [place]? -- (unknown; patient is unable to answer questions appropriately due to her current mental state.) ? ?Are You Planning to Harm Someone at This Time? -- (unknown; patient is unable to answer questions appropriately due to her current mental state.) ? ?Explanation: No data recorded ? ?Have You Used Any Alcohol or Drugs in the Past 24 Hours? -- (unknown; patient is unable to answer questions appropriately due to her current mental state.) ? ?How Long Ago Did You Use Drugs or Alcohol? No data recorded ?What Did You Use and How Much? No data recorded ? ?Do You Currently Have a Therapist/Psychiatrist? -- (unknown; patient is unable to answer questions appropriately due to her current mental state.) ? ?Name of Therapist/Psychiatrist: No data recorded ? ?Have You Been Recently Discharged From Any Office Practice or Programs? -- (unknown; patient is unable to answer questions appropriately due to her current mental state.) ? ?  Explanation of Discharge From Practice/Program: No data recorded ? ?  ?CCA Screening Triage Referral Assessment ?Type of Contact: Tele-Assessment ? ?Telemedicine  Service Delivery: Telemedicine service delivery: This service was provided via telemedicine using a 2-way, interactive audio and video technology ? ?Is this Initial or Reassessment? Initial Assessment ? ?Date Telepsych consult ordered in [facility]:  02/23/22 ? ?Time Telepsych consult ordered in [facility]:  1905 ? ?Location of Assessment: [facility] ? ?Provider Location: [facility] Services ? ? ?Collateral Involvement: none ? ? ?Does Patient Have a Stage manager Guardian? No data recorded ?Name and Contact of Legal Guardian: No data recorded ?If Minor and Not Living with Parent(s), Who has Custody? n/a ? ?Is CPS involved or ever been involved? Never ? ?Is APS involved or ever been involved? -- (unknown; patient is unable to answer questions appropriately due to her current mental state.) ? ? ?Patient Determined To Be At Risk for Harm To Self or Others Based on Review of Patient Reported Information or Presenting Complaint? No ? ?Method: No data recorded ?Availability of Means: No data recorded ?Intent: No data recorded ?Notification Required: No data recorded ?Additional Information for Danger to Others Potential: No data recorded ?Additional Comments for Danger to Others Potential: No data recorded ?Are There Guns or Other Weapons in [place]? No data recorded ?Types of Guns/Weapons: No data recorded ?Are These Weapons Safely Secured?                            No data recorded ?Who Could Verify You Are Able To Have These Secured: No data recorded ?Do You Have any Outstanding Charges, Pending Court Dates, Parole/Probation? No data recorded ?Contacted To Inform of Risk of Harm To Self or Others: -- (unable to assess due to patient is psychotic) ? ? ? ?Does Patient Present under Involuntary Commitment? No ? ?IVC Papers Initial File Date: No data recorded ? ?[place] of Residence: Kathleen Argue ? ? ?Patient Currently Receiving the Following Services: -- (unknown) ? ? ?Determination of Need: Emergent (2 hours) ? ? ?Options For  Referral: Inpatient Hospitalization; Medication Management ? ? ? ? ?CCA Biopsychosocial ?Patient Reported Schizophrenia/Schizoaffective Diagnosis in Past: Yes ? ? ?Strengths: -- (unable to assess due to patient is psychotic) ? ? ?Mental Health Symptoms ?Depression:   ?-- (Unable to determine due to patient's presenting mental heatlh state.) ?  ?Duration of Depressive symptoms:    ?Mania:   ?Irritability; Racing thoughts; Recklessness ?  ?Anxiety:    ?-- (Unable to determine due to patient's presenting mental heatlh state.) ?  ?Psychosis:   ?Delusions ?  ?Duration of Psychotic symptoms:  ?Duration of Psychotic Symptoms: Greater than six months ?  ?Trauma:   ?-- (unable to assess due to patient is psychotic) ?  ?Obsessions:   ?Poor insight ?  ?Compulsions:   ?Intrusive/time consuming; Not connected to stressor; Poor Insight; Repeated behaviors/mental acts ?  ?Inattention:   ?Disorganized; Does not follow instructions (not oppositional); Does not seem to listen; Poor follow-through on tasks (Unable to determine due to patient's presenting mental heatlh state.) ?  ?Hyperactivity/Impulsivity:   ?Blurts out answers; Difficulty waiting turn; Talks excessively; Several symptoms present in 2 of more settings ?  ?Oppositional/Defiant Behaviors:   ?None ?  ?Emotional Irregularity:   ?None ?  ?Other Mood/Personality Symptoms:   ?Pt was speaking in word salad. Pt was labile during TTS Consult ?  ? ?Mental Status Exam ?Appearance and self-care  ?Stature:   ?  Average ?  ?Weight:   ?Average weight ?  ?Clothing:   ?-- (pt was in hospital scrubs) ?  ?Grooming:   ?Normal ?  ?Cosmetic use:   ?None ?  ?Posture/gait:   ?Tense; Bizarre ?  ?Motor activity:   ?Agitated ?  ?Sensorium  ?Attention:   ?Unaware ?  ?Concentration:   ?Preoccupied; Scattered ?  ?Orientation:   ?Person ?  ?Recall/memory:   ?Defective in Recent ?  ?Affect and Mood  ?Affect:   ?Flat; Tearful; Labile ?  ?Mood:   ?Euphoric ?  ?Relating  ?Eye contact:   ?Fleeting ?   ?Facial expression:   ?Angry ?  ?Attitude toward examiner:   ?Dramatic; Suspicious ?  ?Thought and Language  ?Speech flow:  ?Flight of Ideas; Garbled ?  ?Thought content:   ?Delusions ?  ?Preoccupation:   ?R

## 2022-02-23 NOTE — ED Notes (Signed)
Attempted again to get pt's VS, pt states "no, it is all important, don't touch me" ?

## 2022-02-23 NOTE — ED Notes (Signed)
Multiple attempts to try to get patient to take her medications but she would not take them.  ?

## 2022-02-23 NOTE — ED Notes (Signed)
Attempted to give patient ordered PO medication. Patient awake at this itme. Patient doe snot follow commands to sit up in bed to take meds. Patient not wanting to eat breakfast, patient continues to lay in bed stating "no, you are the most beautiful one" Patient does not seem to understand the task to sit up, eat, and take medications. Kathrynn Humble, MD made aware of patient status at this time. Patient calm and laying in stretcher. No obvious distress at this time  ?

## 2022-02-23 NOTE — ED Notes (Signed)
Attempted to pull medications again and talk pt into taking them, pt continues to refuse.  ?

## 2022-02-23 NOTE — ED Provider Notes (Signed)
Called by nursing for patient refusing antibiotics.  UA equivocal for UTI.  Attempted to give patient her medications orally and she refused - she is confused, tangential, hallucinating but calm.  Do not feel that IM abx are needed at this time but this will need to be re-addressed in the future.   ?  ?Quintella Reichert, MD ?02/23/22 [phone] ? ?

## 2022-02-23 NOTE — Progress Notes (Signed)
Patient has been faxed out after [facility] declined the Pt at this time. Patient meets [facility] inpatient criteria per Merlyn Lot, NP. Patient has been faxed out to the following facilities:  ? ?[facility]  [address] [place] [address] [phone]  ?[place], [place] [address] [phone]  ?[facility]  [place], [place] [address] [phone]  ?[facility]  [address] [place] [address] [phone]  ?[facility]  [address] [place], [place] [address] [phone]  ?[facility]  [place], [place] [address] [phone]  ?[facility]  [address] [place] [address] [phone]  ?[facility]  [address], [place] [address] [phone]  ?[facility]  [address] [place] [address] [phone]  ?[facility]  [address] [place] [address] [phone]  ?[facility]  [place], [place] [address] [phone]  ?[facility]  [address], [place] [address] [phone]  ?[place] [address], [place] [address] [phone]  ?[facility]  [address], [place] [address] [phone]  ? ?Mariea Clonts, MSW, LCSW-A  ?7:18 PM 02/23/2022   ?

## 2022-02-23 NOTE — ED Notes (Signed)
Patient refusing vitals at this time. Patient pulls arm away from tech. Stating, "no, you are the most beautiful" ?

## 2022-02-23 NOTE — ED Notes (Signed)
Pt messing with her glasses and broke them  ?

## 2022-02-23 NOTE — ED Notes (Signed)
Attempted to update VS, pt states "do not touch me".  ?

## 2022-02-23 NOTE — ED Notes (Signed)
Pt refuses to let tech update vitals ?

## 2022-02-23 NOTE — ED Notes (Addendum)
Pt is refusing vitals. Pt says "no, dont touch me, you are green" ?

## 2022-02-23 NOTE — ED Provider Notes (Signed)
Emergency Medicine Observation Re-evaluation Note ? ?Paula Dickson is a 56 y.o. female, seen on rounds today.  Pt initially presented to the ED for complaints of Manic Behavior ?Currently, the patient is calm. ? ?Physical Exam  ?BP 128/79 (BP Location: Right Arm)   Pulse 82   Temp 98.5 ?F (36.9 ?C) (Oral)   Resp 17   SpO2 96%  ?Physical Exam ?General: no acute distress ?Psych: tangential thinking ? ?ED Course / MDM  ?EKG:EKG Interpretation ? ?Date/Time:  Wednesday February 22 2022 14:28:12 EDT ?Ventricular Rate:  95 ?PR Interval:  162 ?QRS Duration: 84 ?QT Interval:  360 ?QTC Calculation: 452 ?R Axis:   -1 ?Text Interpretation: Normal sinus rhythm Minimal voltage criteria for LVH, may be normal variant ( R in aVL ) T wave abnormality, consider anterior ischemia Abnormal ECG When compared with ECG of 19-Dec-2021 15:33, PREVIOUS ECG IS PRESENT Confirmed by Madalyn Rob [phone]) on 02/22/2022 3:15:31 PM ? ?I have reviewed the labs performed to date as well as medications administered while in observation.  Recent changes in the last 24 hours include : none. ? ?Plan  ?Current plan is for patient to get psych evaluation and stabilization. ? Suleyma Wafer [place] is not under involuntary commitment. ? ? ?  ?Varney Biles, MD ?02/23/22 1000 ? ?

## 2022-02-23 NOTE — BH Assessment (Signed)
Per Merlyn Lot, NP, patient meets criteria for inpatient psychiatric treatment. Disposition Social Worker/Counselor to seek appropriate placement.  ?

## 2022-02-23 NOTE — ED Notes (Signed)
Pt continues to refuse vitals

## 2022-02-24 DIAGNOSIS — F250 Schizoaffective disorder, bipolar type: Secondary | ICD-10-CM

## 2022-02-24 LAB — URINE CULTURE

## 2022-02-24 NOTE — ED Notes (Signed)
Report called to [facility], [facility] called for transport. ?

## 2022-02-24 NOTE — ED Notes (Signed)
Patient stayed up all night speaking all night responding to internal stimuli. She refused her medication but remained calm throughout the shift.  ?

## 2022-02-24 NOTE — ED Provider Notes (Signed)
Emergency Medicine Observation Re-evaluation Note ? ?Paula Dickson is a 56 y.o. female, seen on rounds today.  Pt initially presented to the ED for complaints of Manic Behavior and Psychiatric Evaluation ?Currently, the patient is comfortable ,awake. ? ?Physical Exam  ?BP (!) 169/101 (BP Location: Left Arm)   Pulse 93   Temp 98.1 ?F (36.7 ?C) (Oral)   Resp 20   SpO2 97%  ?Physical Exam ?General: no distress ? ?ED Course / MDM  ?EKG:EKG Interpretation ? ?Date/Time:  Wednesday February 22 2022 14:28:12 EDT ?Ventricular Rate:  95 ?PR Interval:  162 ?QRS Duration: 84 ?QT Interval:  360 ?QTC Calculation: 452 ?R Axis:   -1 ?Text Interpretation: Normal sinus rhythm Minimal voltage criteria for LVH, may be normal variant ( R in aVL ) T wave abnormality, consider anterior ischemia Abnormal ECG When compared with ECG of 19-Dec-2021 15:33, PREVIOUS ECG IS PRESENT Confirmed by Madalyn Rob [phone]) on 02/22/2022 3:15:31 PM ? ?I have reviewed the labs performed to date as well as medications administered while in observation.  Recent changes in the last 24 hours include . ? ?Plan  ?Current plan is for patient to be placed. Seems like [facility] declined. ? Paula Dickson is not under involuntary commitment. ? ? ?  ?Varney Biles, MD ?02/24/22 1207 ? ?

## 2022-02-24 NOTE — ED Notes (Signed)
Pt refuse her vital signs this morining . ?

## 2022-02-24 NOTE — Consult Note (Addendum)
Telepsych Consultation  ? ?Reason for Consult:  psych consult ?Referring Physician:  Madalyn Rob, MD ?Location of Patient:  Paula Dickson [phone] ?Location of Provider: [facility] ? ?Patient Identification: Paula Dickson ?MRN:  [id number] ?Principal Diagnosis: Schizoaffective disorder, bipolar type ([place]) ?Diagnosis:  Principal Problem: ?  Schizoaffective disorder, bipolar type ([place]) ? ? ?Total Time spent with patient: 20 minutes ? ?Subjective:   ?[name] is a 56 y.o. female patient admitted with manic behavior. On assessment patient presents laying in bed. Stares at machine, makes eye motions; refuses to answer questions. Puts foot to camera. Tells provider appropriate birthday, begins to laugh inappropriately then stops talking and stares at screen. Severe thought blocking noted. Per chart review patient refusing medication and care; remains guarded possibly responding to internal/external stimuli.  ? ?HPI:  [name] is a 56 year old female patient with past history of schizophrenia, bipolar disorder admitted with manic behavior from home for being non-compliant with psych medications and erratic behavior. UDS-, BAL<10. PDMP reviewed, Lorazepam 1 mg TAB 90 pills last filled 01/04/22.  ? ?Past Psychiatric History: schizophrenia, suicide attempt, insomnia, overdose, severe manic bipolar 1 with psychotic behavior, lithium toxicity, schizoaffective bipolar type  ? ?Risk to Self:  yes ?Risk to Others:  yes ?Prior Inpatient Therapy:  yes ?Prior Outpatient Therapy:  yes ? ?Past Medical History:  ?Past Medical History:  ?Diagnosis Date  ? Bipolar affective disorder ([place])   ? Bipolar disorder ([place])   ? History of arthritis   ? History of chicken pox   ? History of depression   ? History of genital warts   ? history of heart murmur   ? History of high blood pressure   ? History of thyroid disease   ? History of UTI   ? Hypertension   ? Low TSH level 07/13/2017  ? Schizophrenia  ([place])   ?  ?Past Surgical History:  ?Procedure Laterality Date  ? ABLATION ON ENDOMETRIOSIS    ? CYST REMOVAL NECK    ? around 11 years ago /benign  ? MULTIPLE TOOTH EXTRACTIONS    ? ?Family History:  ?Family History  ?Problem Relation Age of Onset  ? Arthritis Father   ? Hyperlipidemia Father   ? High blood pressure Father   ? Diabetes Sister   ? Diabetes Mother   ? Diabetes Brother   ? Mental illness Brother   ? Alcohol abuse Paternal Uncle   ? Alcohol abuse Paternal Grandfather   ? Breast cancer Maternal Aunt   ? Breast cancer Paternal Aunt   ? High blood pressure Sister   ? Mental illness Other   ?     runs in family  ? ?Family Psychiatric  History: not noted ?Social History:  ?Social History  ? ?Substance and Sexual Activity  ?Alcohol Use Not Currently  ?   ?Social History  ? ?Substance and Sexual Activity  ?Drug Use Not Currently  ?  ?Social History  ? ?Socioeconomic History  ? Marital status: Single  ?  Spouse name: Not on file  ? Number of children: Not on file  ? Years of education: Not on file  ? Highest education level: Patient refused  ?Occupational History  ? Not on file  ?Tobacco Use  ? Smoking status: Never  ? Smokeless tobacco: Never  ?Vaping Use  ? Vaping Use: Never used  ?Substance and Sexual Activity  ? Alcohol use: Not Currently  ? Drug use: Not Currently  ? Sexual activity:  Not Currently  ?Other Topics Concern  ? Not on file  ?Social History Narrative  ? ** Merged History Encounter **  ?    ? ?Social Determinants of Health  ? ?Financial Resource Strain: Not on file  ?Food Insecurity: Not on file  ?Transportation Needs: Not on file  ?Physical Activity: Not on file  ?Stress: Not on file  ?Social Connections: Not on file  ? ?Additional Social History: ?  ?Allergies:  No Known Allergies ? ?Labs:  ?Results for orders placed or performed during the hospital encounter of 02/22/22 (from the past 48 hour(s))  ?Resp Panel by RT-PCR (Flu A&B, Covid) Nasopharyngeal Swab     Status: None  ? Collection Time:  02/22/22  1:52 PM  ? Specimen: Nasopharyngeal Swab; Nasopharyngeal(NP) swabs in vial transport medium  ?Result Value Ref Range  ? SARS Coronavirus 2 by RT PCR NEGATIVE NEGATIVE  ?  Comment: (NOTE) ?SARS-CoV-2 target nucleic acids are NOT DETECTED. ? ?The SARS-CoV-2 RNA is generally detectable in upper respiratory ?specimens during the acute phase of infection. The lowest ?concentration of SARS-CoV-2 viral copies this assay can detect is ?138 copies/mL. A negative result does not preclude SARS-Cov-2 ?infection and should not be used as the sole basis for treatment or ?other patient management decisions. A negative result may occur with  ?improper specimen collection/handling, submission of specimen other ?than nasopharyngeal swab, presence of viral mutation(s) within the ?areas targeted by this assay, and inadequate number of viral ?copies(<138 copies/mL). A negative result must be combined with ?clinical observations, patient history, and epidemiological ?information. The expected result is Negative. ? ?Fact Sheet for Patients:  ?EntrepreneurPulse.com.au ? ?Fact Sheet for Healthcare Providers:  ?IncredibleEmployment.be ? ?This test is no t yet approved or cleared by the [place] FDA and  ?has been authorized for detection and/or diagnosis of SARS-CoV-2 by ?FDA under an Emergency Use Authorization (EUA). This EUA will remain  ?in effect (meaning this test can be used) for the duration of the ?COVID-19 declaration under Section 564(b)(1) of the Act, 21 ?U.S.C.section 360bbb-3(b)(1), unless the authorization is terminated  ?or revoked sooner.  ? ? ?  ? Influenza A by PCR NEGATIVE NEGATIVE  ? Influenza B by PCR NEGATIVE NEGATIVE  ?  Comment: (NOTE) ?The Xpert Xpress SARS-CoV-2/FLU/RSV plus assay is intended as an aid ?in the diagnosis of influenza from Nasopharyngeal swab specimens and ?should not be used as a sole basis for treatment. Nasal washings and ?aspirates are  unacceptable for Xpert Xpress SARS-CoV-2/FLU/RSV ?testing. ? ?Fact Sheet for Patients: ?EntrepreneurPulse.com.au ? ?Fact Sheet for Healthcare Providers: ?IncredibleEmployment.be ? ?This test is not yet approved or cleared by the [place] FDA and ?has been authorized for detection and/or diagnosis of SARS-CoV-2 by ?FDA under an Emergency Use Authorization (EUA). This EUA will remain ?in effect (meaning this test can be used) for the duration of the ?COVID-19 declaration under Section 564(b)(1) of the Act, 21 U.S.C. ?section 360bbb-3(b)(1), unless the authorization is terminated or ?revoked. ? ?Performed at [facility], [address] ?[place], [place] [address] ?  ?Urine rapid drug screen (hosp performed)     Status: None  ? Collection Time: 02/22/22  3:22 PM  ?Result Value Ref Range  ? Opiates NONE DETECTED NONE DETECTED  ? Cocaine NONE DETECTED NONE DETECTED  ? Benzodiazepines NONE DETECTED NONE DETECTED  ? Amphetamines NONE DETECTED NONE DETECTED  ? Tetrahydrocannabinol NONE DETECTED NONE DETECTED  ? Barbiturates NONE DETECTED NONE DETECTED  ?  Comment: (NOTE) ?DRUG SCREEN FOR  MEDICAL PURPOSES ?ONLY.  IF CONFIRMATION IS NEEDED ?FOR ANY PURPOSE, NOTIFY LAB ?WITHIN 5 DAYS. ? ?LOWEST DETECTABLE LIMITS ?FOR URINE DRUG SCREEN ?Drug Class                     Cutoff (ng/mL) ?Amphetamine and metabolites    1000 ?Barbiturate and metabolites    200 ?Benzodiazepine                 200 ?Tricyclics and metabolites     300 ?Opiates and metabolites        300 ?Cocaine and metabolites        300 ?THC                            50 ?Performed at [facility], [address] ?[place], [place] [address] ?  ?Urinalysis, Routine w reflex microscopic Urine, Clean Catch     Status: Abnormal  ? Collection Time: 02/22/22  3:22 PM  ?Result Value Ref Range  ? Color, Urine YELLOW YELLOW  ? APPearance CLEAR CLEAR  ? Specific Gravity, Urine 1.013 1.005 - 1.030  ?  pH 6.0 5.0 - 8.0  ? Glucose, UA NEGATIVE NEGATIVE mg/dL  ? Hgb urine dipstick NEGATIVE NEGATIVE  ? Bilirubin Urine NEGATIVE NEGATIVE  ? Ketones, ur 20 (A) NEGATIVE mg/dL  ? Protein, ur NEGATIVE NEGATIVE mg/dL  ? N

## 2022-02-24 NOTE — Progress Notes (Signed)
[facility] LCSW Progress Note ?  ?02/24/2022    12:11 PM ? ?Fumiye Lubben Bartee  ? ?[id number]  ? ?Type of Contact and Topic:  Psychiatric Bed Placement  ? ?Pt accepted to [facility]    ? ?Patient meets inpatient criteria per Merlyn Lot, NP ? ?The attending provider will be Dr. Alcide Clever  ? ?Call report to [phone]  ? ?Claretta Fraise, RN @ [facility] ED  notified.    ? ?Pt scheduled  to arrive at [facility] ANYTIME. Please send IVC paperwork to [phone].  ? ? ?Mariea Clonts, MSW, LCSW-A  ?12:13 PM 02/24/2022   ?  ? ?  ?  ? ? ? ? ?  ?

## 2022-02-24 NOTE — Progress Notes (Signed)
Patient has been denied by [facility] and has been faxed out. Patient meets [facility] inpatient criteria per Merlyn Lot, NP. Patient has been faxed out to the following facilities:  ? ?[facility]  [address] [place] [address] [phone]  ?[place], [place] [address] [phone]  ?[place]  [place], [place] [address] [phone]  ?[facility]  [address] [place] [address] [phone]  ?[facility]  [address] [place], [place] [address] [phone]  ?[facility]  [place], [place] [address] [phone]  ?[facility]  [address], [place] [address] [phone]  ?[facility]  [address] [place] [address] [phone]  ?[place]  [address] [place] [address] [phone]  ?[facility]  [address] [place] [address] [phone]  ?[facility]  [place], [place] [address] [phone]  ?[facility]  [address] [name] [address] [phone]  ?[place] [name] [place] [address] [phone]  ?[facility]  [address], [place] [address] [phone]  ? ?Mariea Clonts, MSW, LCSW-A  ?10:07 AM 02/24/2022   ?

## 2022-03-15 ENCOUNTER — Emergency Department (HOSPITAL_COMMUNITY)
Admission: EM | Admit: 2022-03-15 | Discharge: 2022-03-17 | Disposition: A | Discharge status: Other Acute Inpatient Hospital | Payer: Medicare Other | Attending: Emergency Medicine | Admitting: Emergency Medicine

## 2022-03-15 ENCOUNTER — Other Ambulatory Visit: Payer: Self-pay

## 2022-03-15 ENCOUNTER — Encounter (HOSPITAL_COMMUNITY): Payer: Self-pay

## 2022-03-15 DIAGNOSIS — F209 Schizophrenia, unspecified: Secondary | ICD-10-CM | POA: Insufficient documentation

## 2022-03-15 DIAGNOSIS — Z79899 Other long term (current) drug therapy: Secondary | ICD-10-CM | POA: Insufficient documentation

## 2022-03-15 DIAGNOSIS — F312 Bipolar disorder, current episode manic severe with psychotic features: Secondary | ICD-10-CM | POA: Insufficient documentation

## 2022-03-15 DIAGNOSIS — Z008 Encounter for other general examination: Secondary | ICD-10-CM

## 2022-03-15 DIAGNOSIS — T50916A Underdosing of multiple unspecified drugs, medicaments and biological substances, initial encounter: Secondary | ICD-10-CM | POA: Insufficient documentation

## 2022-03-15 DIAGNOSIS — Z046 Encounter for general psychiatric examination, requested by authority: Secondary | ICD-10-CM | POA: Diagnosis present

## 2022-03-15 DIAGNOSIS — Z91128 Patient's intentional underdosing of medication regimen for other reason: Secondary | ICD-10-CM | POA: Diagnosis not present

## 2022-03-15 DIAGNOSIS — Z20822 Contact with and (suspected) exposure to covid-19: Secondary | ICD-10-CM | POA: Insufficient documentation

## 2022-03-15 DIAGNOSIS — Z91148 Patient's other noncompliance with medication regimen for other reason: Secondary | ICD-10-CM | POA: Insufficient documentation

## 2022-03-15 MED ORDER — LORAZEPAM 1 MG PO TABS
1.0000 mg | ORAL_TABLET | Freq: Once | ORAL | Status: AC
  Administered 2022-03-15: 1 mg via ORAL
  Filled 2022-03-15: qty 1

## 2022-03-15 NOTE — ED Provider Notes (Signed)
?[facility] DEPT ?Provider Note ? ? ?CSN: [id number] ?Arrival date & time: 03/15/22  1748 ? ?  ? ?History ? ?Chief Complaint  ?Patient presents with  ? Psychiatric Evaluation  ? ? ?Paula Dickson is a 56 y.o. female with history of severe manic bipolar 1 disorder with psychotic behavior and schizophrenia who presents the emergency department with her sister for psychiatric evaluation.  The sister reports that the patient has been refusing all of her medications and is "shut down".  She was just at old [facility] facility.  Since she reports that she was discharged home, she became withdrawn and noncompliant with taking her medications. Sister reports multiple episodes of similar presentations.  ? ?Level 5 caveat due to psychiatric condition. ? ?HPI ? ?  ? ?Home Medications ?Prior to Admission medications   ?Medication Sig Start Date End Date Taking? Authorizing Provider  ?amantadine (SYMMETREL) 100 MG capsule Take 1 capsule (100 mg total) by mouth daily. 12/24/21   Freida Busman, MD  ?benztropine (COGENTIN) 1 MG tablet Take 1 tablet (1 mg total) by mouth 2 (two) times daily. 12/23/21   Freida Busman, MD  ?divalproex (DEPAKOTE ER) 250 MG 24 hr tablet Take 3 tablets (750 mg total) by mouth daily with supper. 12/23/21   Freida Busman, MD  ?haloperidol (HALDOL) 10 MG tablet Take 1 tablet (10 mg total) by mouth at bedtime. 12/23/21   Freida Busman, MD  ?haloperidol (HALDOL) 5 MG tablet Take 1 tablet (5 mg total) by mouth daily. 12/24/21   Freida Busman, MD  ?LORazepam (ATIVAN) 1 MG tablet Take 1 mg by mouth 3 (three) times daily. 01/04/22   [provider]  ?losartan (COZAAR) 25 MG tablet Take 25 mg by mouth daily. 01/05/22   [provider]  ?melatonin 10 MG TABS Take 10 mg by mouth at bedtime. ?Patient not taking: Reported on 02/23/2022 12/23/21   Freida Busman, MD  ?melatonin 5 MG TABS Take 10 mg by mouth at bedtime.    [provider]  ?meloxicam (MOBIC)  15 MG tablet Take 15 mg by mouth daily.    [provider]  ?methimazole (TAPAZOLE) 10 MG tablet Take 10 mg by mouth 3 (three) times daily. 01/26/22   [provider]  ?methimazole (TAPAZOLE) 5 MG tablet Take 1 tablet (5 mg total) by mouth 2 (two) times daily. ?Patient not taking: Reported on 02/23/2022 12/23/21   Freida Busman, MD  ?OLANZapine (ZYPREXA) 10 MG tablet Take 10 mg by mouth at bedtime. 11/04/21   [provider]  ?pantoprazole (PROTONIX) 20 MG tablet Take 20 mg by mouth daily. 11/07/21   [provider]  ?propranolol (INDERAL) 10 MG tablet Take 10 mg by mouth 2 (two) times daily. 01/04/22   [provider]  ?propranolol ER (INDERAL LA) 60 MG 24 hr capsule Take 1 capsule (60 mg total) by mouth daily. 12/24/21   Freida Busman, MD  ?zolpidem (AMBIEN) 10 MG tablet Take 1 tablet (10 mg total) by mouth at bedtime. 12/23/21   Freida Busman, MD  ?   ? ?Allergies    ?Patient has no known allergies.   ? ?Review of Systems   ?Review of Systems  ?Unable to perform ROS: Psychiatric disorder  ? ?Physical Exam ?Updated Vital Signs ?BP (!) 164/118 (BP Location: Left Arm)   Pulse (!) 112   Temp 98.1 ?F (36.7 ?C) (Oral)   Resp 20   SpO2 100%  ?Physical  Exam ?Vitals and nursing note reviewed.  ?Constitutional:   ?   Appearance: Normal appearance.  ?HENT:  ?   Head: Normocephalic and atraumatic.  ?Eyes:  ?   Conjunctiva/sclera: Conjunctivae normal.  ?Pulmonary:  ?   Effort: Pulmonary effort is normal. No respiratory distress.  ?Skin: ?   General: Skin is warm and dry.  ?Neurological:  ?   Mental Status: She is alert.  ?Psychiatric:     ?   Mood and Affect: Affect is tearful.     ?   Behavior: Behavior is withdrawn.  ?   Comments: Patient is crying. Nods head to all yes or no questions but will not elaborate or speak to staff.   ? ? ?ED Results / Procedures / Treatments   ?Labs ?(all labs ordered are listed, but only abnormal results are displayed) ?Labs Reviewed  ?RESP PANEL BY  RT-PCR (FLU A&B, COVID) [id number]  ?COMPREHENSIVE METABOLIC PANEL  ?ETHANOL  ?RAPID URINE DRUG SCREEN, HOSP PERFORMED  ?CBC WITH DIFFERENTIAL/PLATELET  ?I-STAT BETA HCG BLOOD, ED ([facility], [facility], [facility] ONLY)  ? ? ?EKG ?None ? ?Radiology ?No results found. ? ?Procedures ?Procedures  ? ? ?Medications Ordered in ED ?Medications  ?LORazepam (ATIVAN) tablet 1 mg (1 mg Oral Given 03/15/22 2354)  ? ? ?ED Course/ Medical Decision Making/ A&P ?  ?                        ?Medical Decision Making ?Amount and/or Complexity of Data Reviewed ?Labs: ordered. ? ? ?This patient is a 56 year old female who presents to the ED for concern of psychiatric evaluation.  ? ?Past Medical History / Co-morbidities: ?Severe manic bipolar 1 disorder with psychotic behavior, schizophrenia, paroxysmal atrial fibrillation, cognitive dysfunction and chronic schizophrenia ? ?Physical Exam: ?Physical exam performed. The pertinent findings include: Patient does not appear to be in any acute distress. Nods yes to all questions but will not speak or interact with staff.  ? ?Lab Tests/Imaging studies: ?At the time of my evaluation, patient is refusing all labs. Will attempt to draw labs for medical clearance. ?  ?Medications: ?I ordered medication including ativan. I have reviewed the patients home medicines and have made adjustments as needed. ?  ?Disposition: ?After consideration of the diagnostic results and the patients response to treatment, I feel that patient's requiring psychiatric evaluation.  As she is refusing all lab evaluation, her medications, and is unable to give a further history, I do not believe that she has capacity to make medical decisions.  At this time I think that she is requiring involuntary commitment.  I discussed this case with my attending physician Dr Langston Masker will take out the IVC.  Otherwise patient is medically cleared, pending labs.  Will consult TTS. ? ? ?Final Clinical Impression(s) / ED Diagnoses ?Final diagnoses:  ?Evaluation by  psychiatric service required  ?Noncompliance with medications  ?Schizophrenia, unspecified type ([place])  ? ? ?Rx / DC Orders ?ED Discharge Orders   ? ? None  ? ?  ? ?Portions of this report may have been transcribed using voice recognition software. Every effort was made to ensure accuracy; however, inadvertent computerized transcription errors may be present. ? ?  ?Azuri Bozard T, PA-C ?03/16/22 0006 ? ?  ?Wyvonnia Dusky, MD ?03/16/22 1000 ? ?

## 2022-03-15 NOTE — ED Triage Notes (Signed)
Per family, Pt, from home, presents for psych evaluation.  Sister reports Pt came home from [facility] x 1 week ago and has been noncompliant  w/ medications and she completely "shut down" today.  Pt will not answer questions or follow commands from ED staff.  ?

## 2022-03-15 NOTE — ED Provider Triage Note (Signed)
Emergency Medicine Provider Triage Evaluation Note ? ?Paula Chestnut Latona , a 56 y.o. female  was evaluated in triage.  Patient brought in by her sister for psychiatric evaluation.  She reports that patient has been refusing all her medications, and has "shut down" and does not want to speak to anyone. ? ?Patient nods yes to all of my questions, but does not elaborate further. ? ?History of severe manic bipolar 1 disorder with psychotic behavior, schizophrenia ? ?Review of Systems  ?Unable to obtain ? ?Physical Exam  ?BP (!) 177/121 (BP Location: Left Arm) Comment: informed the nurse of b/p  Pulse (!) 126   Temp 98.1 ?F (36.7 ?C) (Oral)   Resp 18   SpO2 95%  ?Gen:   Awake, no distress   ?Resp:  Normal effort  ?MSK:   Moves extremities without difficulty  ?Other:   ? ?Medical Decision Making  ?Medically screening exam initiated at 6:08 PM.  Appropriate orders placed.  Paula Dickson was informed that the remainder of the evaluation will be completed by another provider, this initial triage assessment does not replace that evaluation, and the importance of remaining in the ED until their evaluation is complete. ? ? ?  ?Paula Plummer, PA-C ?03/15/22 1808 ? ?

## 2022-03-16 LAB — RESP PANEL BY RT-PCR (FLU A&B, COVID) ARPGX2
Influenza A by PCR: NEGATIVE
Influenza B by PCR: NEGATIVE
SARS Coronavirus 2 by RT PCR: NEGATIVE

## 2022-03-16 LAB — COMPREHENSIVE METABOLIC PANEL
ALT: 16 U/L (ref 0–44)
AST: 14 U/L — ABNORMAL LOW (ref 15–41)
Albumin: 4.5 g/dL (ref 3.5–5.0)
Alkaline Phosphatase: 75 U/L (ref 38–126)
Anion gap: 11 (ref 5–15)
BUN: 23 mg/dL — ABNORMAL HIGH (ref 6–20)
CO2: 25 mmol/L (ref 22–32)
Calcium: 10.6 mg/dL — ABNORMAL HIGH (ref 8.9–10.3)
Chloride: 113 mmol/L — ABNORMAL HIGH (ref 98–111)
Creatinine, Ser: 1.58 mg/dL — ABNORMAL HIGH (ref 0.44–1.00)
GFR, Estimated: 38 mL/min — ABNORMAL LOW (ref >60)
Glucose, Bld: 122 mg/dL — ABNORMAL HIGH (ref 70–99)
Potassium: 4.1 mmol/L (ref 3.5–5.1)
Sodium: 149 mmol/L — ABNORMAL HIGH (ref 135–145)
Total Bilirubin: 1.3 mg/dL — ABNORMAL HIGH (ref 0.3–1.2)
Total Protein: 7.9 g/dL (ref 6.5–8.1)

## 2022-03-16 LAB — CBC WITH DIFFERENTIAL/PLATELET
Abs Immature Granulocytes: 0.04 10*3/uL (ref 0.00–0.07)
Basophils Absolute: 0.1 10*3/uL (ref 0.0–0.1)
Basophils Relative: 1 %
Eosinophils Absolute: 0 10*3/uL (ref 0.0–0.5)
Eosinophils Relative: 0 %
HCT: 46.7 % — ABNORMAL HIGH (ref 36.0–46.0)
Hemoglobin: 15.1 g/dL — ABNORMAL HIGH (ref 12.0–15.0)
Immature Granulocytes: 0 %
Lymphocytes Relative: 22 %
Lymphs Abs: 2.6 10*3/uL (ref 0.7–4.0)
MCH: 28.2 pg (ref 26.0–34.0)
MCHC: 32.3 g/dL (ref 30.0–36.0)
MCV: 87.1 fL (ref 80.0–100.0)
Monocytes Absolute: 1 10*3/uL (ref 0.1–1.0)
Monocytes Relative: 9 %
Neutro Abs: 8 10*3/uL — ABNORMAL HIGH (ref 1.7–7.7)
Neutrophils Relative %: 68 %
Platelets: 272 10*3/uL (ref 150–400)
RBC: 5.36 MIL/uL — ABNORMAL HIGH (ref 3.87–5.11)
RDW: 14.7 % (ref 11.5–15.5)
WBC: 11.7 10*3/uL — ABNORMAL HIGH (ref 4.0–10.5)
nRBC: 0 % (ref 0.0–0.2)

## 2022-03-16 LAB — RAPID URINE DRUG SCREEN, HOSP PERFORMED
Amphetamines: NOT DETECTED
Barbiturates: NOT DETECTED
Benzodiazepines: NOT DETECTED
Cocaine: NOT DETECTED
Opiates: NOT DETECTED
Tetrahydrocannabinol: NOT DETECTED

## 2022-03-16 LAB — I-STAT BETA HCG BLOOD, ED (MC, WL, AP ONLY): I-stat hCG, quantitative: 10 m[IU]/mL — ABNORMAL HIGH (ref <5)

## 2022-03-16 LAB — ETHANOL: Alcohol, Ethyl (B): <10 mg/dL (ref <10)

## 2022-03-16 MED ORDER — LORAZEPAM 1 MG PO TABS
1.0000 mg | ORAL_TABLET | Freq: Once | ORAL | Status: AC
  Administered 2022-03-16: 1 mg via ORAL
  Filled 2022-03-16: qty 1

## 2022-03-16 NOTE — ED Notes (Signed)
Pt given breakfast tray.  Pt able to eat independently w/out assistance.  Pt calm and cooperative at this time. ?

## 2022-03-16 NOTE — BH Assessment (Signed)
Comprehensive Clinical Assessment (CCA) Note ? ?03/16/2022 ?Horald [place]nut [place] ?[id number] ? ? ?Disposition: TTS completed. Disposition discussed with Dr. Dwyane Dee who recommended inpatient treatment. Per Dr. Dwyane Dee, if no available beds disposition SW to find placement for patient.  ? ?Chief Complaint:  ?Chief Complaint  ?Patient presents with  ? Psychiatric Evaluation  ? ?Visit Diagnosis: Schizoaffective disorder, bipolar type  ? ? ?Endia Moncur Balin is a 56 y.o. female. Patient does not provide any meaningful history due to her current psychiatric state. Called sister [name] Pettijohn)#[phone], emergency contact and attempt to obtain more history but no answer. ? ?Upon chart review: Patient reportedly noncompliant with her psych medications and having erratic behavior.  History of bipolar disorder, schizophrenia." Pt was difficult to assess due to current presentation and seems to being responding to internal stimuli.  ? ?Per H&P: See Beharry Weintraub is a 56 y.o. female with history of severe manic bipolar 1 disorder with psychotic behavior and schizophrenia who presents the emergency department with her sister for psychiatric evaluation.  The sister reports that the patient has been refusing all of her medications and is "shut down".  She was just at old [place] facility.  Since she reports that she was discharged home, she became withdrawn and noncompliant with taking her medications. Sister reports multiple episodes of similar presentations.  ? ?Clinician did make attempts to obtain information from patient. Pt was labile during TTS consult and spoke in word salad. Pt was delusional (religious) and reports repeating the same verbiage throughout today's assessment: "I'm confused, my mind, my mind, my mind, I'm confused". She seemed to want to answer questions but was not able to having a meaningful conversation. She was oriented to person and place. No to time and/or situation.  ? ?Patient also saying  bible versus. Pt appeared to be responding to internal stimuli and was unable to be redirected at times. When patient is asked questions, she would refuse to answer. ?  ?Dis position discussed with Dr. Dwyane Dee who recommended inpatient treatment. Per Dr. Dwyane Dee, if no available beds disposition SW to find placement for patient.  ? ? ?CCA Screening, Triage and Referral (STR) ? ?Patient Reported Information ?How did you hear about [place]? Family/Friend ? ?What Is the Reason for Your Visit/Call Today? Drianna Chandran Armand is a 56 y.o. female with history of severe manic bipolar 1 disorder with psychotic behavior and schizophrenia who presents the emergency department with her sister for psychiatric evaluation.  The sister reports that the patient has been refusing all of her medications and is "shut down".  She was just at old [place] facility.  Since she reports that she was discharged home, she became withdrawn and noncompliant with taking her medications. Sister reports multiple episodes of similar presentations. ? ?How Long Has This Been Causing You Problems? > than 6 months ? ?What Do You Feel Would Help You the Most Today? Treatment for Depression or other mood problem; Medication(s); Stress Management ? ? ?Have You Recently Had Any Thoughts About Hurting Yourself? No ? ?Are You Planning to Commit Suicide/Harm Yourself At This time? No ? ? ?Have you Recently Had Thoughts About [place]? No ? ?Are You Planning to Harm Someone at This Time? No ? ?Explanation: No data recorded ? ?Have You Used Any Alcohol or Drugs in the Past 24 Hours? No ? ?How Long Ago Did You Use Drugs or Alcohol? No data recorded ?What Did You Use and How Much? No data recorded ? ?Do You Currently Have a  Therapist/Psychiatrist? No ? ?Name of Therapist/Psychiatrist: No data recorded ? ?Have You Been Recently Discharged From Any Office Practice or Programs? No ? ?Explanation of Discharge From Practice/Program: No data recorded ? ?  ?CCA  Screening Triage Referral Assessment ?Type of Contact: Tele-Assessment ? ?Telemedicine Service Delivery: Telemedicine service delivery: This service was provided via telemedicine using a 2-way, interactive audio and video technology ? ?Is this Initial or Reassessment? Initial Assessment ? ?Date Telepsych consult ordered in [facility]:  03/16/22 ? ?Time Telepsych consult ordered in [facility]:  1905 ? ?Location of Assessment: [facility] ? ?Provider Location: [facility] Assessment Services ? ? ?Collateral Involvement: none ? ? ?Does Patient Have a Stage manager Guardian? No data recorded ?Name and Contact of Legal Guardian: No data recorded ?If Minor and Not Living with Parent(s), Who has Custody? n/a ? ?Is CPS involved or ever been involved? Never ? ?Is APS involved or ever been involved? Never ? ? ?Patient Determined To Be At Risk for Harm To Self or Others Based on Review of Patient Reported Information or Presenting Complaint? No ? ?Method: No data recorded ?Availability of Means: No data recorded ?Intent: No data recorded ?Notification Required: No data recorded ?Additional Information for Danger to Others Potential: No data recorded ?Additional Comments for Danger to Others Potential: No data recorded ?Are There Guns or Other Weapons in [place]? No data recorded ?Types of Guns/Weapons: No data recorded ?Are These Weapons Safely Secured?                            No data recorded ?Who Could Verify You Are Able To Have These Secured: No data recorded ?Do You Have any Outstanding Charges, Pending Court Dates, Parole/Probation? No data recorded ?Contacted To Inform of Risk of Harm To Self or Others: -- (unable to assess due to patient is psychotic) ? ? ? ?Does Patient Present under Involuntary Commitment? No ? ?IVC Papers Initial File Date: No data recorded ? ?[place] of Residence: Kathleen Arg[place] ? ? ?Patient Currently Receiving the Following Services: -- (unknown) ? ? ?Determination of Need: Emergent (2 hours) ? ? ?Options For  Referral: Medication Management; Inpatient Hospitalization ? ? ? ? ?CCA Biopsychosocial ?Patient Reported Schizophrenia/Schizoaffective Diagnosis in Past: Yes ? ? ?Strengths: -- (unable to assess due to patient is psychotic) ? ? ?Mental Health Symptoms ?Depression:   ?None ?  ?Duration of Depressive symptoms:    ?Mania:   ?Irritability; Racing thoughts; Recklessness ?  ?Anxiety:    ?N/A ?  ?Psychosis:   ?Delusions ?  ?Duration of Psychotic symptoms:  ?Duration of Psychotic Symptoms: Greater than six months ?  ?Trauma:   ?N/A ?  ?Obsessions:   ?Poor insight ?  ?Compulsions:   ?Intrusive/time consuming; Not connected to stressor; Poor Insight; Repeated behaviors/mental acts ?  ?Inattention:   ?Disorganized; Does not follow instructions (not oppositional); Does not seem to listen; Poor follow-through on tasks ?  ?Hyperactivity/Impulsivity:   ?Blurts out answers; Difficulty waiting turn; Talks excessively; Several symptoms present in 2 of more settings ?  ?Oppositional/Defiant Behaviors:   ?None ?  ?Emotional Irregularity:   ?None ?  ?Other Mood/Personality Symptoms:   ?Pt was speaking in word salad. Pt was labile during TTS Consult ?  ? ?Mental Status Exam ?Appearance and self-care  ?Stature:   ?Average ?  ?Weight:   ?Average weight ?  ?Clothing:   ?-- (pt was in hospital scrubs) ?  ?Grooming:   ?Normal ?  ?Cosmetic use:   ?  None ?  ?Posture/gait:   ?Tense; Bizarre ?  ?Motor activity:   ?Agitated ?  ?Sensorium  ?Attention:   ?Unaware ?  ?Concentration:   ?Preoccupied; Scattered ?  ?Orientation:   ?Person ?  ?Recall/memory:   ?Defective in Recent ?  ?Affect and Mood  ?Affect:   ?Flat; Tearful; Labile ?  ?Mood:   ?Euphoric ?  ?Relating  ?Eye contact:   ?Fleeting ?  ?Facial expression:   ?Angry ?  ?Attitude toward examiner:   ?Dramatic; Suspicious ?  ?Thought and Language  ?Speech flow:  ?Flight of Ideas; Garbled ?  ?Thought content:   ?Delusions ?  ?Preoccupation:   ?Religion ?  ?Hallucinations:   ?Other (Comment) ?   ?Organization:  No data recorded  ?Executive Functions  ?Fund of Knowledge:   ?Poor ?  ?Intelligence:   ?Average ?  ?Abstraction:   ?Overly abstract ?  ?Judgement:   ?Poor ?  ?Reality Testing:   ?Distorted ?  ?Reinaldo Meeker

## 2022-03-16 NOTE — ED Notes (Signed)
Patient  to room 27. Patient alert but drowsy.  Cooperative. Confusion noted.  Resting comfortably. ?

## 2022-03-16 NOTE — ED Notes (Signed)
Pt w/ psych evaluator at this time for TTS evaluation. ?

## 2022-03-16 NOTE — BH Assessment (Addendum)
TTS completed. Discussed clinicals with Dr. Dwyane Dee and patient is recommended for inpatient treatment. Per Dr. Dwyane Dee, disposition Social Worker to seek appropriate placement. Patient's nurse and EDP provided disposition updates.   ?

## 2022-03-16 NOTE — ED Notes (Signed)
Pt changed into burgundy scrubs. One pt bag in designated cabinet for [facility] ?

## 2022-03-17 LAB — BASIC METABOLIC PANEL
Anion gap: 8 (ref 5–15)
BUN: 23 mg/dL — ABNORMAL HIGH (ref 6–20)
CO2: 27 mmol/L (ref 22–32)
Calcium: 9.8 mg/dL (ref 8.9–10.3)
Chloride: 110 mmol/L (ref 98–111)
Creatinine, Ser: 1.22 mg/dL — ABNORMAL HIGH (ref 0.44–1.00)
GFR, Estimated: 51 mL/min — ABNORMAL LOW (ref >60)
Glucose, Bld: 114 mg/dL — ABNORMAL HIGH (ref 70–99)
Potassium: 3.9 mmol/L (ref 3.5–5.1)
Sodium: 145 mmol/L (ref 135–145)

## 2022-03-17 MED ORDER — PROPRANOLOL HCL 20 MG PO TABS
10.0000 mg | ORAL_TABLET | Freq: Two times a day (BID) | ORAL | Status: DC
  Administered 2022-03-17: 10 mg via ORAL
  Filled 2022-03-17: qty 1

## 2022-03-17 MED ORDER — LOSARTAN POTASSIUM 25 MG PO TABS
25.0000 mg | ORAL_TABLET | Freq: Every day | ORAL | Status: DC
  Administered 2022-03-17: 25 mg via ORAL
  Filled 2022-03-17: qty 1

## 2022-03-17 MED ORDER — LORAZEPAM 1 MG PO TABS
1.0000 mg | ORAL_TABLET | Freq: Three times a day (TID) | ORAL | Status: DC
  Administered 2022-03-17 (×2): 1 mg via ORAL
  Filled 2022-03-17 (×2): qty 1

## 2022-03-17 MED ORDER — BENZTROPINE MESYLATE 0.5 MG PO TABS
0.5000 mg | ORAL_TABLET | Freq: Two times a day (BID) | ORAL | Status: DC
  Administered 2022-03-17: 0.5 mg via ORAL
  Filled 2022-03-17: qty 1

## 2022-03-17 MED ORDER — TRAZODONE HCL 50 MG PO TABS: 50.0000 mg | ORAL_TABLET | Freq: Every day | ORAL | Status: DC

## 2022-03-17 MED ORDER — PANTOPRAZOLE SODIUM 40 MG PO TBEC
40.0000 mg | DELAYED_RELEASE_TABLET | Freq: Every day | ORAL | Status: DC
  Administered 2022-03-17: 40 mg via ORAL
  Filled 2022-03-17: qty 1

## 2022-03-17 MED ORDER — OLANZAPINE 10 MG PO TABS: 10.0000 mg | ORAL_TABLET | Freq: Every day | ORAL | Status: DC

## 2022-03-17 MED ORDER — HALOPERIDOL 5 MG PO TABS
5.0000 mg | ORAL_TABLET | Freq: Every day | ORAL | Status: DC
  Administered 2022-03-17: 5 mg via ORAL
  Filled 2022-03-17: qty 1

## 2022-03-17 NOTE — Progress Notes (Signed)
03/17/2022  [facility] for transport. Will be here shortly. ?

## 2022-03-17 NOTE — ED Provider Notes (Addendum)
Emergency Medicine Observation Re-evaluation Note ? ?Paula Dickson is a 56 y.o. female, seen on rounds today.  Pt initially presented to the ED for complaints of Psychiatric Evaluation ?Currently, the patient is sleeping. ? ?Physical Exam  ?BP (!) 150/90 (BP Location: Left Arm)   Pulse 80   Temp 97.7 ?F (36.5 ?C) (Oral)   Resp 15   SpO2 100%  ?Physical Exam ?General: sleeping ?Cardiac: regular rate ?Lungs: clear ?Psych: sleeping and can't evaluate at this time ? ?ED Course / MDM  ?EKG:EKG Interpretation ? ?Date/Time:  Thursday March 16 2022 08:48:07 EDT ?Ventricular Rate:  125 ?PR Interval:  148 ?QRS Duration: 84 ?QT Interval:  308 ?QTC Calculation: 444 ?R Axis:   -1 ?Text Interpretation: Sinus tachycardia Minimal voltage criteria for LVH, may be normal variant ( R in aVL ) T wave abnormality, consider inferior ischemia T wave abnormality, consider anterolateral ischemia Abnormal ECG When compared with ECG of 22-Feb-2022 14:28, PREVIOUS ECG IS PRESENT Confirmed by Lacretia Leigh ([id number]) on 03/16/2022 8:50:24 AM ? ?I have reviewed the labs performed to date as well as medications administered while in observation.  Recent changes in the last 24 hours include none. ? ?Plan  ?Current plan is for patient is IVC and has been accepted to [facility].  They did question whether she was medically clear as her BMP did show a sodium of 149 and elevated chloride.  Patient does have an empty water cup next to her bed but is unclear if she has been eating and drinking since she has been here.  We will repeat BMP to ensure that patient is medically clear and if so she can be transferred to the facility.  Vital signs are reassuring. ?Paula Dickson is under involuntary commitment. ?  ?12:33 PM ?Repeat BMP shows improving sodium and renal function.  At this time feel the patient is medically clear to undergo her recommended psychiatric care ?  ?Blanchie Dessert, MD ?03/17/22 [phone] ? ?  ?Blanchie Dessert,  MD ?03/17/22 1233 ? ?

## 2022-03-17 NOTE — Progress Notes (Signed)
[facility]/BMU LCSW Progress Note ?  ?03/17/2022    2:22 PM ? ?Paula Dickson  ? ?[id number]  ? ?Type of Contact and Topic:  Psychiatric Bed Placement  ? ?Pt accepted to [facility]   ? ?Patient meets inpatient criteria per Dr. Dwyane Dee ? ?The attending provider will be Erenest Blank, NP ? ?Call report to [phone] ? ?Vivi Ferns, RN @ [facility] ED notified.    ? ?Pt scheduled  to arrive at [place] ANYTIME. Please fax IVC paperwork prior to transporting the Pt.  ? ?Mariea Clonts, MSW, LCSW-A  ?2:24 PM 03/17/2022   ?  ? ?  ?  ? ? ? ? ?  ?

## 2022-03-17 NOTE — ED Provider Notes (Signed)
Pt has been accepted to [facility] by Erenest Blank, NP.  She remains stable for transfer.  ?  ?Isla Pence, MD ?03/17/22 1725 ? ?

## 2022-03-17 NOTE — ED Notes (Signed)
[facility] has accepted pt. Report can be called to [place] NP is the accepting provider. ?

## 2022-03-17 NOTE — Progress Notes (Signed)
03/17/22  1133  Labs drawn. Light green, purple, and dark green tubes sent to main lab. ?

## 2022-03-17 NOTE — Progress Notes (Signed)
Inpatient Behavioral Health Placement ? ?Pt meets inpatient criteria per Dr. Dwyane Dee. There are no available beds at [facility] per [facility] Wynonia Hazard, RN. Referral was sent to the following facilities;  ? ? ?Destination ?Service Provider Address Phone Fax  ?[facility]  [address] [place] [address] [phone]  ?[place], [place] [address] [phone]  ?[facility] [address] [place] [address] [phone]  ?[facility]  [place], [place] [address] [phone]  ?[place]  [place], [place] [address] [phone]  ?[place] [address] [place] [address] [phone]  ?[facility]  [address] [place] [address] [phone]  ?[place]  [address], [place] [address] [phone]  ?[facility]  [address] [place] [address] [phone]  ?[facility]  [address] [place] [address] [phone]  ?[place]  [address] [place] [address] [phone]  ?[place] [address], [place] [address] [phone]  ?[facility]  [address] [place] [address] [phone]  ?[facility]  [place], [place] [address] [phone]  ?[facility]  [place], [place] [address] [phone]  ?[facility]  [address] [place] [address] [phone]  ? ?Situation ongoing,  CSW will follow up. ? ? ?Benjaman Kindler, MSW, LCSWA ?03/17/2022  @ 12:59 AM; ? ?

## 2022-03-17 NOTE — Progress Notes (Signed)
03/17/2022  1654 Called report to [facility] [phone]. Report given to [facility]. ?

## 2022-04-07 ENCOUNTER — Other Ambulatory Visit: Payer: Self-pay

## 2022-04-07 ENCOUNTER — Emergency Department (HOSPITAL_COMMUNITY)
Admission: EM | Admit: 2022-04-07 | Discharge: 2022-04-12 | Disposition: A | Discharge status: Other Acute Inpatient Hospital | Payer: Medicare Other | Attending: Emergency Medicine | Admitting: Emergency Medicine

## 2022-04-07 DIAGNOSIS — Z20822 Contact with and (suspected) exposure to covid-19: Secondary | ICD-10-CM | POA: Diagnosis not present

## 2022-04-07 DIAGNOSIS — Z79899 Other long term (current) drug therapy: Secondary | ICD-10-CM | POA: Diagnosis not present

## 2022-04-07 DIAGNOSIS — I10 Essential (primary) hypertension: Secondary | ICD-10-CM | POA: Insufficient documentation

## 2022-04-07 DIAGNOSIS — R45851 Suicidal ideations: Secondary | ICD-10-CM | POA: Diagnosis not present

## 2022-04-07 DIAGNOSIS — Z046 Encounter for general psychiatric examination, requested by authority: Secondary | ICD-10-CM | POA: Diagnosis present

## 2022-04-07 DIAGNOSIS — F250 Schizoaffective disorder, bipolar type: Secondary | ICD-10-CM | POA: Diagnosis present

## 2022-04-07 LAB — COMPREHENSIVE METABOLIC PANEL
ALT: 20 U/L (ref 0–44)
AST: 17 U/L (ref 15–41)
Albumin: 4.3 g/dL (ref 3.5–5.0)
Alkaline Phosphatase: 78 U/L (ref 38–126)
Anion gap: 8 (ref 5–15)
BUN: 28 mg/dL — ABNORMAL HIGH (ref 6–20)
CO2: 25 mmol/L (ref 22–32)
Calcium: 10.1 mg/dL (ref 8.9–10.3)
Chloride: 106 mmol/L (ref 98–111)
Creatinine, Ser: 1.43 mg/dL — ABNORMAL HIGH (ref 0.44–1.00)
GFR, Estimated: 43 mL/min — ABNORMAL LOW (ref >60)
Glucose, Bld: 127 mg/dL — ABNORMAL HIGH (ref 70–99)
Potassium: 3.6 mmol/L (ref 3.5–5.1)
Sodium: 139 mmol/L (ref 135–145)
Total Bilirubin: 1.2 mg/dL (ref 0.3–1.2)
Total Protein: 7.4 g/dL (ref 6.5–8.1)

## 2022-04-07 LAB — RESP PANEL BY RT-PCR (FLU A&B, COVID) ARPGX2
Influenza A by PCR: NEGATIVE
Influenza B by PCR: NEGATIVE
SARS Coronavirus 2 by RT PCR: NEGATIVE

## 2022-04-07 LAB — CBC WITH DIFFERENTIAL/PLATELET
Abs Immature Granulocytes: 0.01 10*3/uL (ref 0.00–0.07)
Basophils Absolute: 0 10*3/uL (ref 0.0–0.1)
Basophils Relative: 1 %
Eosinophils Absolute: 0.1 10*3/uL (ref 0.0–0.5)
Eosinophils Relative: 1 %
HCT: 39.2 % (ref 36.0–46.0)
Hemoglobin: 13.1 g/dL (ref 12.0–15.0)
Immature Granulocytes: 0 %
Lymphocytes Relative: 28 %
Lymphs Abs: 1.8 10*3/uL (ref 0.7–4.0)
MCH: 28.6 pg (ref 26.0–34.0)
MCHC: 33.4 g/dL (ref 30.0–36.0)
MCV: 85.6 fL (ref 80.0–100.0)
Monocytes Absolute: 0.5 10*3/uL (ref 0.1–1.0)
Monocytes Relative: 8 %
Neutro Abs: 4.1 10*3/uL (ref 1.7–7.7)
Neutrophils Relative %: 62 %
Platelets: 226 10*3/uL (ref 150–400)
RBC: 4.58 MIL/uL (ref 3.87–5.11)
RDW: 14.6 % (ref 11.5–15.5)
WBC: 6.5 10*3/uL (ref 4.0–10.5)
nRBC: 0 % (ref 0.0–0.2)

## 2022-04-07 LAB — SALICYLATE LEVEL: Salicylate Lvl: <7 mg/dL — ABNORMAL LOW (ref 7.0–30.0)

## 2022-04-07 LAB — ETHANOL: Alcohol, Ethyl (B): <10 mg/dL (ref <10)

## 2022-04-07 LAB — HCG, QUANTITATIVE, PREGNANCY: hCG, Beta Chain, Quant, S: 2 m[IU]/mL (ref <5)

## 2022-04-07 LAB — ACETAMINOPHEN LEVEL: Acetaminophen (Tylenol), Serum: <10 ug/mL — ABNORMAL LOW (ref 10–30)

## 2022-04-07 MED ORDER — ONDANSETRON HCL 4 MG PO TABS: 4.0000 mg | ORAL_TABLET | Freq: Three times a day (TID) | ORAL | Status: DC | PRN

## 2022-04-07 MED ORDER — LORAZEPAM 1 MG PO TABS
1.0000 mg | ORAL_TABLET | Freq: Once | ORAL | Status: AC
  Administered 2022-04-07: 1 mg via ORAL
  Filled 2022-04-07: qty 1

## 2022-04-07 MED ORDER — ACETAMINOPHEN 325 MG PO TABS: 650.0000 mg | ORAL_TABLET | ORAL | Status: DC | PRN

## 2022-04-07 NOTE — ED Provider Triage Note (Signed)
Emergency Medicine Provider Triage Evaluation Note ? ?Paula Dickson , a 56 y.o. female  was evaluated in triage.  Pt complains of not feeling well.  Family helps provide history.  History of bipolar, schizophrenia.  Recently discharged on Monday.  Has not been taking her medications at home.  She has not been sleeping at night, crying a lot per family.  Not wanting to eat and drink.  Family states patient is "shut down."  Family states multiple similar presentations consistent with differently, bipolar medication noncompliance.  No recent injuries or falls per family ? ?Review of Systems  ?Positive: Not eating or drinking ?Negative:  ? ?Physical Exam  ?There were no vitals taken for this visit. ?Gen:   Awake, no distress   ?Resp:  Normal effort  ?MSK:   Moves extremities without difficult, tremors to Bil UE ?Other:   ? ?Medical Decision Making  ?Medically screening exam initiated at 5:20 PM.  Appropriate orders placed.  Paula Dickson was informed that the remainder of the evaluation will be completed by another provider, this initial triage assessment does not replace that evaluation, and the importance of remaining in the ED until their evaluation is complete. ? ?Behavior issue  ?  ?Callaghan Laverdure A, PA-C ?04/07/22 1723 ? ?

## 2022-04-07 NOTE — ED Provider Notes (Signed)
?[facility] DEPT ?Provider Note ? ? ?CSN: [id number] ?Arrival date & time: 04/07/22  1707 ? ?  ? ?History ?PMH: Bipolar 1, hypertension, schizophrenia, depression ?Chief Complaint  ?Patient presents with  ? Suicidal  ? ? ?Paula Dickson is a 56 y.o. female.  Presents the ED for psychiatric evaluation.  She was brought here by her family due to not taking medication, not eating, drinking, or sleeping.  Apparently she is overall just not been taking care of herself.  She was recently discharged from behavioral health facility on Monday.  Patient states that she has not taken her medications since then because she is having so much anxiety of keeping track with them.  She does have some suicidal thoughts, but does not endorse having any active plans.  She denies any homicidal ideations, auditory or visual hallucinations. ? ?HPI ? ?  ? ?Home Medications ?Prior to Admission medications   ?Medication Sig Start Date End Date Taking? Authorizing Provider  ?benztropine (COGENTIN) 0.5 MG tablet Take 0.5 mg by mouth 2 (two) times daily. 03/07/22   [provider]  ?haloperidol (HALDOL) 5 MG tablet Take 1 tablet (5 mg total) by mouth daily. 12/24/21   Freida Busman, MD  ?LORazepam (ATIVAN) 1 MG tablet Take 1 mg by mouth 3 (three) times daily. 01/04/22   [provider]  ?losartan (COZAAR) 25 MG tablet Take 25 mg by mouth daily. 01/05/22   [provider]  ?melatonin 10 MG TABS Take 10 mg by mouth at bedtime. 12/23/21   Freida Busman, MD  ?melatonin 5 MG TABS Take 10 mg by mouth at bedtime.    [provider]  ?meloxicam (MOBIC) 15 MG tablet Take 15 mg by mouth daily.    [provider]  ?methimazole (TAPAZOLE) 10 MG tablet Take 10 mg by mouth 3 (three) times daily. 01/26/22   [provider]  ?OLANZapine (ZYPREXA) 10 MG tablet Take 10 mg by mouth at bedtime. 11/04/21   [provider]  ?pantoprazole (PROTONIX) 40 MG tablet Take 40 mg by  mouth daily. 03/07/22   [provider]  ?propranolol (INDERAL) 10 MG tablet Take 10 mg by mouth 2 (two) times daily. 01/04/22   [provider]  ?traZODone (DESYREL) 50 MG tablet Take 50 mg by mouth at bedtime. 03/07/22   [provider]  ?   ? ?Allergies    ?Patient has no known allergies.   ? ?Review of Systems   ?Review of Systems  ?Constitutional:  Positive for appetite change.  ?Psychiatric/Behavioral:  Positive for sleep disturbance and suicidal ideas. The patient is nervous/anxious.   ?All other systems reviewed and are negative. ? ?Physical Exam ?Updated Vital Signs ?BP (!) 152/87 (BP Location: Left Arm)   Pulse (!) 102   Temp 98.2 ?F (36.8 ?C) (Oral)   Resp 18   SpO2 99%  ?Physical Exam ?Vitals and nursing note reviewed.  ?Constitutional:   ?   General: She is not in acute distress. ?   Appearance: Normal appearance. She is well-developed. She is not ill-appearing, toxic-appearing or diaphoretic.  ?HENT:  ?   Head: Normocephalic and atraumatic.  ?   Nose: No nasal deformity.  ?   Mouth/Throat:  ?   Lips: Pink. No lesions.  ?Eyes:  ?   General: Gaze aligned appropriately. No scleral icterus.    ?   Right eye: No discharge.     ?   Left eye: No discharge.  ?  Conjunctiva/sclera: Conjunctivae normal.  ?   Right eye: Right conjunctiva is not injected. No exudate or hemorrhage. ?   Left eye: Left conjunctiva is not injected. No exudate or hemorrhage. ?Pulmonary:  ?   Effort: Pulmonary effort is normal. No respiratory distress.  ?Skin: ?   General: Skin is warm and dry.  ?Neurological:  ?   Mental Status: She is alert and oriented to person, place, and time.  ?Psychiatric:     ?   Attention and Perception: She does not perceive auditory or visual hallucinations.     ?   Mood and Affect: Mood is anxious.     ?   Speech: Speech normal. She is communicative. Speech is not rapid and pressured, delayed, slurred or tangential.     ?   Behavior: Behavior normal. Behavior is cooperative.      ?   Thought Content: Thought content is not paranoid or delusional. Thought content includes suicidal ideation. Thought content does not include homicidal ideation. Thought content does not include homicidal or suicidal plan.     ?   Judgment: Judgment is not impulsive.  ?   Comments: Visibly very anxious and shaking in the bed.  Speech is normal does not seem tangential or rapid. Does not seem to be responding to any external stimuli.   ? ? ?ED Results / Procedures / Treatments   ?Labs ?(all labs ordered are listed, but only abnormal results are displayed) ?Labs Reviewed  ?COMPREHENSIVE METABOLIC PANEL - Abnormal; Notable for the following components:  ?    Result Value  ? Glucose, Bld 127 (*)   ? BUN 28 (*)   ? Creatinine, Ser 1.43 (*)   ? GFR, Estimated 43 (*)   ? All other components within normal limits  ?SALICYLATE LEVEL - Abnormal; Notable for the following components:  ? Salicylate Lvl <7.4 (*)   ? All other components within normal limits  ?ACETAMINOPHEN LEVEL - Abnormal; Notable for the following components:  ? Acetaminophen (Tylenol), Serum <10 (*)   ? All other components within normal limits  ?RESP PANEL BY RT-PCR (FLU A&B, COVID) ARPGX2  ?ETHANOL  ?CBC WITH DIFFERENTIAL/PLATELET  ?RAPID URINE DRUG SCREEN, HOSP PERFORMED  ?HCG, QUANTITATIVE, PREGNANCY  ? ? ?EKG ?None ? ?Radiology ?No results found. ? ?Procedures ?Procedures  ? ? ?Medications Ordered in ED ?Medications  ?acetaminophen (TYLENOL) tablet 650 mg (has no administration in time range)  ?ondansetron (ZOFRAN) tablet 4 mg (has no administration in time range)  ?LORazepam (ATIVAN) tablet 1 mg (1 mg Oral Given 04/07/22 1800)  ? ? ?ED Course/ Medical Decision Making/ A&P ?  ?                        ?Medical Decision Making ?Amount and/or Complexity of Data Reviewed ?Labs: ordered. ? ?Risk ?OTC drugs. ?Prescription drug management. ? ? ? ?MDM  ?This is a 56 y.o. female who presents to the ED for psychiatric evaluation ? ?My Impression, Plan, and ED  Course: Patient here for psychiatric evaluation.  She was brought here by her family with concerns that she has not been taking her medication, has not been eating or drinking, has not been taking care of herself over the past week since she was discharged from a psychiatric facility.  Patient does have some passive suicidal ideations.  My exam, she seems extremely anxious so we will give her some Ativan here. Medical Clearance labs placed. ? ?I personally ordered, reviewed, and  interpreted all laboratory work and imaging and agree with radiologist interpretation. Results interpreted below: CBC normal. CMP with chronic creatinine abnormality. Tox screen negative. COVID/Flu negative.  ? ? ?Unremarkable workup.  Do not think that she has a medical reason for her symptoms.  She has well-documented psychiatric history.  TTS consult orders are placed.  Med rec for home medications ordered. ? ?Charting Requirements ?Additional history is obtained from:  Independent historian ?External Records from outside source obtained and reviewed including: Reviewed prior labs, psychiatric notes ?Social Determinants of Health:   mental illness ?Pertinant PMH that complicates patient's illness: Bipolar type I, pression, schizophrenia ? ?Patient Care ?Problems that were addressed during this visit: ?- Suicidal Ideations: Acute illness ?Medications given in ED: Ativan 1 mg ?Reevaluation of the patient after these medicines showed that the patient improved ?I have reviewed home medications. ?Consultations: consult to tts ?Disposition: tts consult for dispo ? ?Portions of this note were generated with Lobbyist. Dictation errors may occur despite best attempts at proofreading. ?  ? ? ?Final Clinical Impression(s) / ED Diagnoses ?Final diagnoses:  ?Suicidal ideation  ? ? ?Rx / DC Orders ?ED Discharge Orders   ? ? None  ? ?  ? ? ?  ?Adolphus Birchwood, PA-C ?04/07/22 2010 ? ?  ?Blanchie Dessert, MD ?04/11/22 1125 ? ?

## 2022-04-07 NOTE — ED Triage Notes (Signed)
Pt BIB family due to not taking medication, not eating, drinking, sleeping, and taking care of herself. Pt was recently discharged from a behavioral hospital per family. Pt endorses thoughts of wanting to harm herself. Denies wanting to hurt herself. Denies AH/VH. ?

## 2022-04-08 LAB — RAPID URINE DRUG SCREEN, HOSP PERFORMED
Amphetamines: NOT DETECTED
Barbiturates: NOT DETECTED
Benzodiazepines: NOT DETECTED
Cocaine: NOT DETECTED
Opiates: NOT DETECTED
Tetrahydrocannabinol: NOT DETECTED

## 2022-04-08 MED ORDER — TRAZODONE HCL 50 MG PO TABS
50.0000 mg | ORAL_TABLET | Freq: Every day | ORAL | Status: DC
  Administered 2022-04-08 – 2022-04-11 (×4): 50 mg via ORAL
  Filled 2022-04-08 (×4): qty 1

## 2022-04-08 MED ORDER — BUSPIRONE HCL 5 MG PO TABS
7.5000 mg | ORAL_TABLET | Freq: Two times a day (BID) | ORAL | Status: DC
  Administered 2022-04-08 – 2022-04-12 (×9): 7.5 mg via ORAL
  Filled 2022-04-08 (×11): qty 1.5

## 2022-04-08 MED ORDER — LORAZEPAM 0.5 MG PO TABS
0.5000 mg | ORAL_TABLET | Freq: Three times a day (TID) | ORAL | Status: DC
  Administered 2022-04-08 (×2): 0.5 mg via ORAL
  Filled 2022-04-08 (×2): qty 1

## 2022-04-08 MED ORDER — PALIPERIDONE ER 3 MG PO TB24
3.0000 mg | ORAL_TABLET | Freq: Every day | ORAL | Status: DC
  Administered 2022-04-08: 3 mg via ORAL
  Filled 2022-04-08 (×2): qty 1

## 2022-04-08 MED ORDER — HYDROXYZINE HCL 25 MG PO TABS: 25.0000 mg | ORAL_TABLET | Freq: Three times a day (TID) | ORAL | Status: DC | PRN

## 2022-04-08 NOTE — ED Notes (Signed)
Pt ambulated to restroom and back to bed stand by assist.  ?

## 2022-04-08 NOTE — Progress Notes (Signed)
CSW followed up with Angela Nevin with [facility] in reference to a referral sent for placement. It was reported that a COVID, UDS, and BAL is need for further review. ? ?Glennie [place], MSW, LCSW-A, LCAS ?Phone: [phone] ?Disposition/TOC ? ?

## 2022-04-08 NOTE — BH Assessment (Signed)
Comprehensive Clinical Assessment (CCA) Note ? ?04/08/2022 ?Horald Chestnut Calarco ?[id number] ? ?DISPOSITION: Quintella Reichert, NP determined Pt meets criteria for inpatient psychiatric treatment. [facility] does not have an available bed. Notified Dr. Deno Etienne and Erick Colace, RN of recommendation via secure message. ? ?The patient demonstrates the following risk factors for suicide: Chronic risk factors for suicide include: psychiatric disorder of schizophrenia . Acute risk factors for suicide include: recent discharge from inpatient psychiatry. Protective factors for this patient include: responsibility to others (children, family). Considering these factors, the overall suicide risk at this point appears to be high. Patient is not appropriate for outpatient follow up. ? ?[facility] from 04/07/2022 in [facility] ED from 03/15/2022 in [facility] ED from 02/22/2022 in [facility]  ?C-SSRS RISK CATEGORY High Risk No Risk No Risk  ? ?  ? ?Pt is a 56 year old single female who presents unaccompanied to [facility] ED after being brought by family members. Pt has a diagnosis of schizoaffective disorder, bipolar type. She presents as very anxious and confused. She is unable to answer most questions. She says she was discharged from [facility] less than one week ago. She says she agreed to take medications but when she returned home she was confused about what medications to take. She says she was so anxious she did not take anything. Pt says, "I've made things worse." According to family, Pt has not been taking medications, eating, drinking, or caring for herself. Pt says she has not been sleeping, stating "my mind is not resting." She acknowledges some suicidal thoughts with no plan. She denies thoughts of harming others. When asked about hallucinations, Pt says she sometimes hears gunshots. She denies  alcohol or other substance use. ? ?Pt appears to live alone. She has a history of similar episodes where she stops taking medications and becomes psychotic, delusional, and confused. Medical record indicates multiple psychiatric hospitalizations and Pt was [facility] to [facility]/24/[facility]. ? ?Pt is covered by a blanket, alert and oriented to person and place. Pt speaks in a soft tone, at moderate volume and very slow pace. Motor behavior appears tremulous. Eye contact is good. Pt's mood is anxious, fearful, and worried; affect is congruent with mood. Thought process is circumstantial and Pt appears confused. Memory is impaired. Concentration is poor. Judgment is poor. She is generally cooperative and says "I know I need help." ? ? ?Chief Complaint:  ?Chief Complaint  ?Patient presents with  ? Suicidal  ? ?Visit Diagnosis: F25.0 Schizoaffective disorder, Bipolar type ? ? ?CCA Screening, Triage and Referral (STR) ? ?Patient Reported Information ?How did you hear about [place]? Family/Friend ? ?Referral name: No data recorded ?Referral phone number: No data recorded ? ?Whom do you see for routine medical problems? No data recorded ?Practice/Facility Name: No data recorded ?Practice/Facility Phone Number: No data recorded ?Name of Contact: No data recorded ?Contact Number: No data recorded ?Contact Fax Number: No data recorded ?Prescriber Name: No data recorded ?Prescriber Address (if known): No data recorded ? ?What Is the Reason for Your Visit/Call Today? Pt has diagnosis of schizophrenia and was recently discharged from [facility]. She was brought to [facility] by family because she is not sleeping, eating, drinking fluids or caring for herself. Pt has difficulty answering questions and appears extremely anxious. She says she did not take medications after discharge because she was confused. She has multiple episodes of similar presentations. ? ?How Long Has  This Been Causing You Problems? > than 6 months ? ?What Do You  Feel Would Help You the Most Today? Treatment for Depression or other mood problem; Medication(s) ? ? ?Have You Recently Been in Any Inpatient Treatment (Hospital/Detox/Crisis Center/28-Day Program)? No data recorded ?Name/Location of Program/Hospital:No data recorded ?How Long Were You There? No data recorded ?When Were You Discharged? No data recorded ? ?Have You Ever Received Services From [employer] Before? No data recorded ?Who Do You See at [facility]? No data recorded ? ?Have You Recently Had Any Thoughts About Hurting Yourself? Yes ? ?Are You Planning to Commit Suicide/Harm Yourself At This time? No ? ? ?Have you Recently Had Thoughts About [place]? No ? ?Explanation: No data recorded ? ?Have You Used Any Alcohol or Drugs in the Past 24 Hours? No ? ?How Long Ago Did You Use Drugs or Alcohol? No data recorded ?What Did You Use and How Much? No data recorded ? ?Do You Currently Have a Therapist/Psychiatrist? No ? ?Name of Therapist/Psychiatrist: No data recorded ? ?Have You Been Recently Discharged From Any Office Practice or Programs? Yes ? ?Explanation of Discharge From Practice/Program: Pt reports she was discharged from [facility] less than one week ago ? ? ?  ?CCA Screening Triage Referral Assessment ?Type of Contact: Tele-Assessment ? ?Is this Initial or Reassessment? Initial Assessment ? ?Date Telepsych consult ordered in [facility]:  04/07/22 ? ?Time Telepsych consult ordered in [facility]:  1904 ? ? ?Patient Reported Information Reviewed? No data recorded ?Patient Left Without Being Seen? No data recorded ?Reason for Not Completing Assessment: unable to assess due to patient is psychotic and refusing to answer questions. ? ? ?Collateral Involvement: Medical record ? ? ?Does Patient Have a Stage manager Guardian? No data recorded ?Name and Contact of Legal Guardian: No data recorded ?If Minor and Not Living with Parent(s), Who has Custody? n/a ? ?Is CPS involved or ever been involved?  Never ? ?Is APS involved or ever been involved? Never ? ? ?Patient Determined To Be At Risk for Harm To Self or Others Based on Review of Patient Reported Information or Presenting Complaint? Yes, for Self-Harm ? ?Method: No data recorded ?Availability of Means: No data recorded ?Intent: No data recorded ?Notification Required: No data recorded ?Additional Information for Danger to Others Potential: No data recorded ?Additional Comments for Danger to Others Potential: No data recorded ?Are There Guns or Other Weapons in [place]? No data recorded ?Types of Guns/Weapons: No data recorded ?Are These Weapons Safely Secured?                            No data recorded ?Who Could Verify You Are Able To Have These Secured: No data recorded ?Do You Have any Outstanding Charges, Pending Court Dates, Parole/Probation? No data recorded ?Contacted To Inform of Risk of Harm To Self or Others: Family/Significant Other: ? ? ?Location of Assessment: [facility] ? ? ?Does Patient Present under Involuntary Commitment? No ? ?IVC Papers Initial File Date: No data recorded ? ?[place] of Residence: Kathleen Argue ? ? ?Patient Currently Receiving the Following Services: Medication Management ? ? ?Determination of Need: Emergent (2 hours) ? ? ?Options For Referral: Inpatient Hospitalization ? ? ? ? ?CCA Biopsychosocial ?Intake/Chief Complaint:  No data recorded ?Current Symptoms/Problems: No data recorded ? ?Patient Reported Schizophrenia/Schizoaffective Diagnosis in Past: Yes ? ? ?Strengths: Pt has family support ? ?Preferences: No data recorded ?Abilities: No data recorded ? ?  Type of Services Patient Feels are Needed: No data recorded ? ?Initial Clinical Notes/Concerns: No data recorded ? ?Mental Health Symptoms ?Depression:   ?Change in energy/activity; Difficulty Concentrating; Fatigue; Hopelessness; Increase/decrease in appetite; Sleep (too much or little) ?  ?Duration of Depressive symptoms:  ?Greater than two weeks ?  ?Mania:   ?None ?   ?Anxiety:    ?Difficulty concentrating; Fatigue; Restlessness; Tension; Worrying; Sleep ?  ?Psychosis:   ?Grossly disorganized or catatonic behavior ?  ?Duration of Psychotic symptoms:  ?Greater than six month

## 2022-04-08 NOTE — Progress Notes (Signed)
CSW spoke with Tiffany with [facility]. It was reported that she will staff with the present to the provider for further review. ? ?Glennie Isle, MSW, LCSW-A, LCAS ?Phone: [phone] ?Disposition/TOC ? ?

## 2022-04-08 NOTE — ED Notes (Signed)
Asked patient if she needed to go to the restroom and she said she did not feel like she could provide a sample. Asked patient is dry and she was. EKG completed. TTS complete. Dressed out pt in a scrub top and left her regular pants on because we did not have anything that would fit her. The bag contains a bra, shirt and the patients shoes. The belongings are in a bag (with the pts label) and placed in the cabinet marked Patients Belongings 19-22 and Venita Sheffield. Notified security to wand patient. Charge nurse was made aware of all actions taken.  ?

## 2022-04-08 NOTE — Consult Note (Addendum)
[facility] Face-to-Face Psychiatry Consult  ? ?Reason for Consult:  Psychiatric evaluation ?Referring Physician:  ER Physician ?Patient Identification: Paula Dickson ?MRN:  [id number] ?Principal Diagnosis: <principal problem not specified> ?Diagnosis:  Active Problems: ?  * No active hospital problems. * ? ? ?Total Time spent with patient: 20 minutes ? ?Subjective:   ?Paula Dickson is a 56 y.o. female patient admitted with hx significant for Schizoaffective disorder Bipolar type brought in by family for not eating, not taking medications and not able to care for her self since discharge from [facility] unit. Less than a week ago. ? ?HPI:  a 56 y.o. female patient admitted with hx significant for Schizoaffective disorder Bipolar type brought in by family for not eating, not taking medications and not able to care for her self since discharge from [facility] unit. Less than a week ago.  This morning patient could not interact with provider but was anxious and repeating herself.  Nursing staff also stated that patient is anxious and keeps repeating same word but could not make any meaningful sentences. ?Provider called her sister, MS [name] Dartt who reported that her sister since coming back from [facility] have not been doing well.  She has not been sleeping, nopt eating and is unable to attend to her ADLS..  MS [name] reported that patient was not discharge on any medications or script but was told to go to [facility] and get her Medications. ?Sister went to [facility] outpatient Psychiatric facility and there were no medications for patient and she has not had any post discharge follow up.  Patient hospitalized in [facility] in March as well and has had ER Visits at [facility] as well.  She continues to meet criteria for inpatient Psychiatric unit care. List of medications she was supposed to be discharged on from [facility] did not include any mood stabilizer or  antipsychotic.  Based on possibility of non compliance with Medications we will look into Medication that will offer Long acting Injectable route. Will start oral dose of invega with plan to offer Injectable while waiting for bed. ?Past Psychiatric History: Schizoaffective disorder, Bipolar type, Generalized anxiety disorder.  Previous inpatient hospitalizations.  Last -[facility] where she was discharged from last week. ? ?Risk to Self:   ?Risk to Others:   ?Prior Inpatient Therapy:   ?Prior Outpatient Therapy:   ? ?Past Medical History:  ?Past Medical History:  ?Diagnosis Date  ? Bipolar affective disorder ([place])   ? Bipolar disorder ([place])   ? History of arthritis   ? History of chicken pox   ? History of depression   ? History of genital warts   ? history of heart murmur   ? History of high blood pressure   ? History of thyroid disease   ? History of UTI   ? Hypertension   ? Low TSH level 07/13/2017  ? Schizophrenia ([place])   ?  ?Past Surgical History:  ?Procedure Laterality Date  ? ABLATION ON ENDOMETRIOSIS    ? CYST REMOVAL NECK    ? around 11 years ago /benign  ? MULTIPLE TOOTH EXTRACTIONS    ? ?Family History:  ?Family History  ?Problem Relation Age of Onset  ? Arthritis Father   ? Hyperlipidemia Father   ? High blood pressure Father   ? Diabetes Sister   ? Diabetes Mother   ? Diabetes Brother   ? Mental illness Brother   ? Alcohol abuse Paternal Uncle   ?  Alcohol abuse Paternal Grandfather   ? Breast cancer Maternal Aunt   ? Breast cancer Paternal Aunt   ? High blood pressure Sister   ? Mental illness Other   ?     runs in family  ? ?Family Psychiatric  History: unable to assess at this time. ?Social History:  ?Social History  ? ?Substance and Sexual Activity  ?Alcohol Use Not Currently  ?   ?Social History  ? ?Substance and Sexual Activity  ?Drug Use Not Currently  ?  ?Social History  ? ?Socioeconomic History  ? Marital status: Single  ?  Spouse name: Not on file  ? Number of children: Not on file   ? Years of education: Not on file  ? Highest education level: Patient refused  ?Occupational History  ? Not on file  ?Tobacco Use  ? Smoking status: Never  ? Smokeless tobacco: Never  ?Vaping Use  ? Vaping Use: Never used  ?Substance and Sexual Activity  ? Alcohol use: Not Currently  ? Drug use: Not Currently  ? Sexual activity: Not Currently  ?Other Topics Concern  ? Not on file  ?Social History Narrative  ? ** Merged History Encounter **  ?    ? ?Social Determinants of Health  ? ?Financial Resource Strain: Not on file  ?Food Insecurity: Not on file  ?Transportation Needs: Not on file  ?Physical Activity: Not on file  ?Stress: Not on file  ?Social Connections: Not on file  ? ?Additional Social History: ?  ? ?Allergies:  No Known Allergies ? ?Labs:  ?Results for orders placed or performed during the hospital encounter of 04/07/22 (from the past 48 hour(s))  ?Comprehensive metabolic panel     Status: Abnormal  ? Collection Time: 04/07/22  6:00 PM  ?Result Value Ref Range  ? Sodium 139 135 - 145 mmol/L  ? Potassium 3.6 3.5 - 5.1 mmol/L  ? Chloride 106 98 - 111 mmol/L  ? CO2 25 22 - 32 mmol/L  ? Glucose, Bld 127 (H) 70 - 99 mg/dL  ?  Comment: Glucose reference range applies only to samples taken after fasting for at least 8 hours.  ? BUN 28 (H) 6 - 20 mg/dL  ? Creatinine, Ser 1.43 (H) 0.44 - 1.00 mg/dL  ? Calcium 10.1 8.9 - 10.3 mg/dL  ? Total Protein 7.4 6.5 - 8.1 g/dL  ? Albumin 4.3 3.5 - 5.0 g/dL  ? AST 17 15 - 41 U/L  ? ALT 20 0 - 44 U/L  ? Alkaline Phosphatase 78 38 - 126 U/L  ? Total Bilirubin 1.2 0.3 - 1.2 mg/dL  ? GFR, Estimated 43 (L) >60 mL/min  ?  Comment: (NOTE) ?Calculated using the CKD-EPI Creatinine Equation (2021) ?  ? Anion gap 8 5 - 15  ?  Comment: Performed at [facility], [place] [address], [place], [place] [address]  ?Ethanol     Status: None  ? Collection Time: 04/07/22  6:00 PM  ?Result Value Ref Range  ? Alcohol, Ethyl (B) <10 <10 mg/dL  ?  Comment: (NOTE) ?Lowest detectable  limit for serum alcohol is 10 mg/dL. ? ?For medical purposes only. ?Performed at [name], [place] [address] ?[place],  [address] ?  ?CBC with Diff     Status: None  ? Collection Time: 04/07/22  6:00 PM  ?Result Value Ref Range  ? WBC 6.5 4.0 - 10.5 K/uL  ? RBC 4.58 3.87 - 5.11 MIL/uL  ? Hemoglobin 13.1 12.0 -  15.0 g/dL  ? HCT 39.2 36.0 - 46.0 %  ? MCV 85.6 80.0 - 100.0 fL  ? MCH 28.6 26.0 - 34.0 pg  ? MCHC 33.4 30.0 - 36.0 g/dL  ? RDW 14.6 11.5 - 15.5 %  ? Platelets 226 150 - 400 K/uL  ? nRBC 0.0 0.0 - 0.2 %  ? Neutrophils Relative % 62 %  ? Neutro Abs 4.1 1.7 - 7.7 K/uL  ? Lymphocytes Relative 28 %  ? Lymphs Abs 1.8 0.7 - 4.0 K/uL  ? Monocytes Relative 8 %  ? Monocytes Absolute 0.5 0.1 - 1.0 K/uL  ? Eosinophils Relative 1 %  ? Eosinophils Absolute 0.1 0.0 - 0.5 K/uL  ? Basophils Relative 1 %  ? Basophils Absolute 0.0 0.0 - 0.1 K/uL  ? Immature Granulocytes 0 %  ? Abs Immature Granulocytes 0.01 0.00 - 0.07 K/uL  ?  Comment: Performed at [facility], [place] [address] [place], [place] [address]  ?Salicylate level     Status: Abnormal  ? Collection Time: 04/07/22  6:00 PM  ?Result Value Ref Range  ? Salicylate Lvl <0.3 (L) 7.0 - 30.0 mg/dL  ?  Comment: Performed at [facility], [place] [address], [place], [place] [address]  ?Acetaminophen level     Status: Abnormal  ? Collection Time: 04/07/22  6:00 PM  ?Result Value Ref Range  ? Acetaminophen (Tylenol), Serum <10 (L) 10 - 30 ug/mL  ?  Comment: (NOTE) ?Therapeutic concentrations vary significantly. A range of 10-30 ug/mL  ?may be an effective concentration for many patients. However, some  ?are best treated at concentrations outside of this range. ?Acetaminophen concentrations >150 ug/mL at 4 hours after ingestion  ?and >50 ug/mL at 12 hours after ingestion are often associated with  ?toxic reactions. ? ?Performed at [facility], [place] [address] ?[place], [place] [address] ?  ?hCG,  quantitative, pregnancy     Status: None  ? Collection Time: 04/07/22  6:00 PM  ?Result Value Ref Range  ? hCG, Beta Chain, Quant, S 2 <5 mIU/mL  ?  Comment:        ?  GEST. AGE      CONC.  (mIU/mL) ?  <=1 WE

## 2022-04-08 NOTE — Progress Notes (Signed)
Per Quintella Reichert, NP, patient meets criteria for inpatient treatment. There are no available beds at [facility] today. CSW faxed referrals to the following facilities for review: ? ?        [facility] ? ?[address] [place] [address] [phone] --  ?[place] N/A [address] [place] [address] [phone] --  ?[facility]  Pending - Request Sent N/A [address] [place] [address] [phone] --  ?[address] [place] [address] [phone] --  ?[address] [place] [address] [phone] --  ?[facility]  Pending - Request Sent N/A [address] [place] [address] [phone] --  ?[place] N/A [address] [place], [place] [address] [phone] --  ?[facility]  Pending - Request Sent N/A [address], [place] [address] [phone] --  ?[place] [address] [place] [address] [phone] --  ?[place] [address] [place] [address] [phone] --  ?[place] N/A [address] [place] [address] [phone] --  ?[place] N/A [address] [place] [address] [phone] --  ?[facility]  Pending - Request Sent N/A [address] [place] [address] [phone] --  ?[place] N/A [address] [place] [address] [phone] --  ?[facility]  Pending - Request Sent N/A [address] [place] [address] [phone] --  ? ? ?TTS will continue to seek bed placement. ? ?Glennie Isle, MSW, LCSW-A, LCAS-A ?Phone: [phone] ?Disposition/TOC ? ?

## 2022-04-09 MED ORDER — PANTOPRAZOLE SODIUM 40 MG PO TBEC
40.0000 mg | DELAYED_RELEASE_TABLET | Freq: Every day | ORAL | Status: DC
  Administered 2022-04-09 – 2022-04-12 (×4): 40 mg via ORAL
  Filled 2022-04-09 (×4): qty 1

## 2022-04-09 MED ORDER — LORAZEPAM 1 MG PO TABS
1.0000 mg | ORAL_TABLET | Freq: Three times a day (TID) | ORAL | Status: DC
  Administered 2022-04-09 – 2022-04-12 (×11): 1 mg via ORAL
  Filled 2022-04-09 (×11): qty 1

## 2022-04-09 MED ORDER — OLANZAPINE 10 MG PO TABS
10.0000 mg | ORAL_TABLET | Freq: Every day | ORAL | Status: DC
  Administered 2022-04-09 – 2022-04-11 (×3): 10 mg via ORAL
  Filled 2022-04-09 (×3): qty 1

## 2022-04-09 MED ORDER — METHIMAZOLE 10 MG PO TABS
10.0000 mg | ORAL_TABLET | Freq: Three times a day (TID) | ORAL | Status: DC
  Administered 2022-04-09 – 2022-04-12 (×11): 10 mg via ORAL
  Filled 2022-04-09 (×13): qty 1

## 2022-04-09 MED ORDER — LOSARTAN POTASSIUM 25 MG PO TABS
25.0000 mg | ORAL_TABLET | Freq: Every day | ORAL | Status: DC
  Administered 2022-04-09 – 2022-04-12 (×4): 25 mg via ORAL
  Filled 2022-04-09 (×4): qty 1

## 2022-04-09 MED ORDER — MELATONIN 3 MG PO TABS
3.0000 mg | ORAL_TABLET | Freq: Every day | ORAL | Status: DC
  Administered 2022-04-09 – 2022-04-11 (×3): 3 mg via ORAL
  Filled 2022-04-09 (×3): qty 1

## 2022-04-09 MED ORDER — PROPRANOLOL HCL 20 MG PO TABS
10.0000 mg | ORAL_TABLET | Freq: Two times a day (BID) | ORAL | Status: DC
  Administered 2022-04-09 – 2022-04-12 (×7): 10 mg via ORAL
  Filled 2022-04-09 (×2): qty 0.5
  Filled 2022-04-09 (×6): qty 1

## 2022-04-09 NOTE — ED Notes (Signed)
Patient is asleep.  

## 2022-04-09 NOTE — Consult Note (Signed)
[facility] Psych ED Progress Note ? ?04/09/2022 3:20 PM ?Paula Dickson  ?MRN:  [id number] ? ? ?Method of visit?: Face to Face  ? ?Subjective:  Paula Dickson is a 56 y.o. female patient admitted with hx significant for Schizoaffective disorder Bipolar type brought in by family for not eating, not taking medications and not able to care for her self since discharge from [facility] unit. Less than a week ago.   ?Today patient is alert and oriented x4 and is able to engage in meaningful conversation.  She is not happy with her not taking medications after hospitalization.  Patient does not remember medications she was discharged with.  Patient states that taking her medications is hard for her because she does not remember what to take.  She also is worried about her housing.  She is living in her deceased parent's home free but she is also worried about her [place] because [place].  TOC is in place for somebody to see her tomorrow.She is not sure where she should get her medications.  Her sister [name] is bringing discharge list of Medications to the ER now.  However patient believes getting back on her medications has improved her over health.  We are still seeking placement.  She denied SI/HI/AVH and no mention of paranoia. ?Principal Problem: Schizoaffective disorder, bipolar type ([place]) ?Diagnosis:  Principal Problem: ?  Schizoaffective disorder, bipolar type ([place]) ? ?Total Time spent with patient: 20 minutes ? ?Past Psychiatric History: See initial psych consult note ? ?Past Medical History:  ?Past Medical History:  ?Diagnosis Date  ? Bipolar affective disorder ([place])   ? Bipolar disorder ([place])   ? History of arthritis   ? History of chicken pox   ? History of depression   ? History of genital warts   ? history of heart murmur   ? History of high blood pressure   ? History of thyroid disease   ? History of UTI   ? Hypertension   ? Low TSH level 07/13/2017  ? Schizophrenia ([place])   ?   ?Past Surgical History:  ?Procedure Laterality Date  ? ABLATION ON ENDOMETRIOSIS    ? CYST REMOVAL NECK    ? around 11 years ago /benign  ? MULTIPLE TOOTH EXTRACTIONS    ? ?Family History:  ?Family History  ?Problem Relation Age of Onset  ? Arthritis Father   ? Hyperlipidemia Father   ? High blood pressure Father   ? Diabetes Sister   ? Diabetes Mother   ? Diabetes Brother   ? Mental illness Brother   ? Alcohol abuse Paternal Uncle   ? Alcohol abuse Paternal Grandfather   ? Breast cancer Maternal Aunt   ? Breast cancer Paternal Aunt   ? High blood pressure Sister   ? Mental illness Other   ?     runs in family  ? ?Family Psychiatric  History: see initial psych note ?Social History:  ?Social History  ? ?Substance and Sexual Activity  ?Alcohol Use Not Currently  ?   ?Social History  ? ?Substance and Sexual Activity  ?Drug Use Not Currently  ?  ?Social History  ? ?Socioeconomic History  ? Marital status: Single  ?  Spouse name: Not on file  ? Number of children: Not on file  ? Years of education: Not on file  ? Highest education level: Patient refused  ?Occupational History  ? Not on file  ?Tobacco Use  ? Smoking  status: Never  ? Smokeless tobacco: Never  ?Vaping Use  ? Vaping Use: Never used  ?Substance and Sexual Activity  ? Alcohol use: Not Currently  ? Drug use: Not Currently  ? Sexual activity: Not Currently  ?Other Topics Concern  ? Not on file  ?Social History Narrative  ? ** Merged History Encounter **  ?    ? ?Social Determinants of Health  ? ?Financial Resource Strain: Not on file  ?Food Insecurity: Not on file  ?Transportation Needs: Not on file  ?Physical Activity: Not on file  ?Stress: Not on file  ?Social Connections: Not on file  ? ? ?Sleep: Good ? ?Appetite:  Fair ? ?Current Medications: ?Current Facility-Administered Medications  ?Medication Dose Route Frequency Provider Last Rate Last Admin  ? acetaminophen (TYLENOL) tablet 650 mg  650 mg Oral Q4H PRN Loeffler, Grace C, PA-C      ? busPIRone (BUSPAR)  tablet 7.5 mg  7.5 mg Oral BID Sindee Stucker C, NP   7.5 mg at 04/09/22 8119  ? hydrOXYzine (ATARAX) tablet 25 mg  25 mg Oral TID PRN Delfin Gant, NP      ? LORazepam (ATIVAN) tablet 1 mg  1 mg Oral TID Dorie Rank, MD   1 mg at 04/09/22 1478  ? losartan (COZAAR) tablet 25 mg  25 mg Oral Daily Dorie Rank, MD   25 mg at 04/09/22 2956  ? melatonin tablet 3 mg  3 mg Oral QHS Dorie Rank, MD      ? methimazole (TAPAZOLE) tablet 10 mg  10 mg Oral TID Dorie Rank, MD   10 mg at 04/09/22 0943  ? OLANZapine (ZYPREXA) tablet 10 mg  10 mg Oral QHS Dorie Rank, MD      ? ondansetron [facility]) tablet 4 mg  4 mg Oral Q8H PRN Loeffler, Grace C, PA-C      ? pantoprazole (PROTONIX) EC tablet 40 mg  40 mg Oral Daily Dorie Rank, MD   40 mg at 04/09/22 0943  ? propranolol (INDERAL) tablet 10 mg  10 mg Oral BID Dorie Rank, MD   10 mg at 04/09/22 2130  ? traZODone (DESYREL) tablet 50 mg  50 mg Oral QHS Charmaine Downs C, NP   50 mg at 04/08/22 2202  ? ?Current Outpatient Medications  ?Medication Sig Dispense Refill  ? LORazepam (ATIVAN) 1 MG tablet Take 1 mg by mouth 3 (three) times daily.    ? losartan (COZAAR) 25 MG tablet Take 25 mg by mouth daily.    ? melatonin 3 MG TABS tablet Take 3 mg by mouth at bedtime.    ? methimazole (TAPAZOLE) 10 MG tablet Take 10 mg by mouth 3 (three) times daily.    ? OLANZapine (ZYPREXA) 10 MG tablet Take 10 mg by mouth at bedtime.    ? pantoprazole (PROTONIX) 40 MG tablet Take 40 mg by mouth daily.    ? propranolol (INDERAL) 10 MG tablet Take 10 mg by mouth 2 (two) times daily.    ? benztropine (COGENTIN) 0.5 MG tablet Take 0.5 mg by mouth 2 (two) times daily. (Patient not taking: Reported on 04/07/2022)    ? haloperidol (HALDOL) 5 MG tablet Take 1 tablet (5 mg total) by mouth daily. (Patient not taking: Reported on 04/07/2022) 30 tablet 0  ? traZODone (DESYREL) 50 MG tablet Take 50 mg by mouth at bedtime. (Patient not taking: Reported on 04/07/2022)    ? ? ?Lab Results:  ?Results for orders  placed or performed during  the hospital encounter of 04/07/22 (from the past 48 hour(s))  ?Urine rapid drug screen (hosp performed)     Status: None  ? Collection Time: 04/07/22  4:00 PM  ?Result Value Ref Range  ? Opiates NONE DETECTED NONE DETECTED  ? Cocaine NONE DETECTED NONE DETECTED  ? Benzodiazepines NONE DETECTED NONE DETECTED  ? Amphetamines NONE DETECTED NONE DETECTED  ? Tetrahydrocannabinol NONE DETECTED NONE DETECTED  ? Barbiturates NONE DETECTED NONE DETECTED  ?  Comment: (NOTE) ?DRUG SCREEN FOR MEDICAL PURPOSES ?ONLY.  IF CONFIRMATION IS NEEDED ?FOR ANY PURPOSE, NOTIFY LAB ?WITHIN 5 DAYS. ? ?LOWEST DETECTABLE LIMITS ?FOR URINE DRUG SCREEN ?Drug Class                     Cutoff (ng/mL) ?Amphetamine and metabolites    1000 ?Barbiturate and metabolites    200 ?Benzodiazepine                 200 ?Tricyclics and metabolites     300 ?Opiates and metabolites        300 ?Cocaine and metabolites        300 ?THC                            50 ?Performed at [facility], [place] [address] ?[place], [place] [address] ?  ?Comprehensive metabolic panel     Status: Abnormal  ? Collection Time: 04/07/22  6:00 PM  ?Result Value Ref Range  ? Sodium 139 135 - 145 mmol/L  ? Potassium 3.6 3.5 - 5.1 mmol/L  ? Chloride 106 98 - 111 mmol/L  ? CO2 25 22 - 32 mmol/L  ? Glucose, Bld 127 (H) 70 - 99 mg/dL  ?  Comment: Glucose reference range applies only to samples taken after fasting for at least 8 hours.  ? BUN 28 (H) 6 - 20 mg/dL  ? Creatinine, Ser 1.43 (H) 0.44 - 1.00 mg/dL  ? Calcium 10.1 8.9 - 10.3 mg/dL  ? Total Protein 7.4 6.5 - 8.1 g/dL  ? Albumin 4.3 3.5 - 5.0 g/dL  ? AST 17 15 - 41 U/L  ? ALT 20 0 - 44 U/L  ? Alkaline Phosphatase 78 38 - 126 U/L  ? Total Bilirubin 1.2 0.3 - 1.2 mg/dL  ? GFR, Estimated 43 (L) >60 mL/min  ?  Comment: (NOTE) ?Calculated using the CKD-EPI Creatinine Equation (2021) ?  ? Anion gap 8 5 - 15  ?  Comment: Performed at [facility], [place] [address]  [place], [place] [address]  ?Ethanol     Status: None  ? Collection Time: 04/07/22  6:00 PM  ?Result Value Ref Range  ? Alcohol, Ethyl (B) <10 <10 mg/dL  ?  Comment: (NOTE) ?Lowest detectable limit for serum alcoh

## 2022-04-09 NOTE — ED Provider Notes (Signed)
Emergency Medicine Observation Re-evaluation Note ? ?Paula Dickson is a 56 y.o. female, seen on rounds today.  Pt initially presented to the ED for complaints of Suicidal ?Currently, the patient is calm and cooperative. ? ?Physical Exam  ?BP (!) 148/100 (BP Location: Right Arm)   Pulse 88   Temp 98.4 ?F (36.9 ?C) (Oral)   Resp 18   SpO2 95%  ?Physical Exam ?General: Awake, no acute distress ?Cardiac: Regular rate ?Lungs: Breathing easily ?Psych: Normal affect, not agitated ? ?ED Course / MDM  ?EKG:EKG Interpretation ? ?Date/Time:  Saturday Apr 08 2022 01:04:20 EDT ?Ventricular Rate:  87 ?PR Interval:    ?QRS Duration: 86 ?QT Interval:  371 ?QTC Calculation: 447 ?R Axis:   3 ?Text Interpretation: Atrial flutter with predominant 3:1 AV block Low voltage, precordial leads Abnormal R-wave progression, early transition Nonspecific T abnormalities, anterior leads No significant change since last tracing Confirmed by Deno Etienne [phone]) on 04/08/2022 6:44:54 AM ? ?I have reviewed the labs performed to date as well as medications administered while in observation.  Recent changes in the last 24 hours include no acute changes. ? ?Plan  ?Current plan is for inpatient treatment. ? Paula Dickson is not under involuntary commitment. ? ? ?  ?Dorie Rank, MD ?04/09/22 0802 ? ?

## 2022-04-09 NOTE — ED Notes (Signed)
Patient taken a shower in TCU ?

## 2022-04-10 MED ORDER — HALOPERIDOL 5 MG PO TABS
5.0000 mg | ORAL_TABLET | Freq: Every day | ORAL | Status: DC
  Administered 2022-04-10 – 2022-04-11 (×2): 5 mg via ORAL
  Filled 2022-04-10 (×2): qty 1

## 2022-04-10 NOTE — Consult Note (Signed)
[facility] Psych ED Progress Note ? ?04/10/2022 2:08 PM ?Paula Dickson  ?MRN:  [id number] ? ? ?Method of visit?: Face to Face  ? ?Subjective:  Patient is more confused this morning.  She does not remember much about her medications, post discharge appointments and keeps saying she was supposed to go for an appointment today and does not remember what the appointment was for.  Most of her answers to questions are " I don't know, I don't remember" ?Call to her sister Paula Dickson also does not yield much answers.  Even with discharge papers Paula Dickson could not say out her sister's medications.  She too does not know anything about her sister.  She only remembered that her sister has had report made to APS and does not know the conclusion.  She also only remembered that her sister, the patient is supposed to have act team that came once and since then have not seen her because patient has been in and out of Hospitals since March.  She reported that Paula Dickson is supposed to be patient's act team providers and reported that patient never liked [name] act team. ?Patient was discharged on Olanzapine and Haldol.  She is currently on Olanzapine and will add Haldol.  Plan is to give Haldol Decanoate on Thursday if she is still here.  Sister reported that their mother died from complications related to Dementia.  Patient will need evaluation for Neurocognitive disorder.  We will continue to monitor patient while looking for bed. ?Principal Problem: Schizoaffective disorder, bipolar type ([place]) ?Diagnosis:  Principal Problem: ?  Schizoaffective disorder, bipolar type ([place]) ? ?Total Time spent with patient: 20 minutes ? ?Past Psychiatric History: See initial Psych evaluation note ? ?Past Medical History:  ?Past Medical History:  ?Diagnosis Date  ? Bipolar affective disorder ([place])   ? Bipolar disorder ([place])   ? History of arthritis   ? History of chicken pox   ? History of depression   ? History of genital warts   ? history of heart murmur   ?  History of high blood pressure   ? History of thyroid disease   ? History of UTI   ? Hypertension   ? Low TSH level 07/13/2017  ? Schizophrenia ([place])   ?  ?Past Surgical History:  ?Procedure Laterality Date  ? ABLATION ON ENDOMETRIOSIS    ? CYST REMOVAL NECK    ? around 11 years ago /benign  ? MULTIPLE TOOTH EXTRACTIONS    ? ?Family History:  ?Family History  ?Problem Relation Age of Onset  ? Arthritis Father   ? Hyperlipidemia Father   ? High blood pressure Father   ? Diabetes Sister   ? Diabetes Mother   ? Diabetes Brother   ? Mental illness Brother   ? Alcohol abuse Paternal Uncle   ? Alcohol abuse Paternal Grandfather   ? Breast cancer Maternal Aunt   ? Breast cancer Paternal Aunt   ? High blood pressure Sister   ? Mental illness Other   ?     runs in family  ? ?Family Psychiatric  History: see initial Psych evaluation note ?Social History:  ?Social History  ? ?Substance and Sexual Activity  ?Alcohol Use Not Currently  ?   ?Social History  ? ?Substance and Sexual Activity  ?Drug Use Not Currently  ?  ?Social History  ? ?Socioeconomic History  ? Marital status: Single  ?  Spouse name: Not on file  ? Number of children: Not on file  ?  Years of education: Not on file  ? Highest education level: Patient refused  ?Occupational History  ? Not on file  ?Tobacco Use  ? Smoking status: Never  ? Smokeless tobacco: Never  ?Vaping Use  ? Vaping Use: Never used  ?Substance and Sexual Activity  ? Alcohol use: Not Currently  ? Drug use: Not Currently  ? Sexual activity: Not Currently  ?Other Topics Concern  ? Not on file  ?Social History Narrative  ? ** Merged History Encounter **  ?    ? ?Social Determinants of Health  ? ?Financial Resource Strain: Not on file  ?Food Insecurity: Not on file  ?Transportation Needs: Not on file  ?Physical Activity: Not on file  ?Stress: Not on file  ?Social Connections: Not on file  ? ? ?Sleep: Good ? ?Appetite:  Fair ? ?Current Medications: ?Current Facility-Administered Medications   ?Medication Dose Route Frequency Provider Last Rate Last Admin  ? acetaminophen (TYLENOL) tablet 650 mg  650 mg Oral Q4H PRN Loeffler, Grace C, PA-C      ? busPIRone (BUSPAR) tablet 7.5 mg  7.5 mg Oral BID Symeon Puleo C, NP   7.5 mg at 04/10/22 1058  ? haloperidol (HALDOL) tablet 5 mg  5 mg Oral QHS Hykeem Ojeda C, NP      ? hydrOXYzine (ATARAX) tablet 25 mg  25 mg Oral TID PRN Delfin Gant, NP      ? LORazepam (ATIVAN) tablet 1 mg  1 mg Oral TID Dorie Rank, MD   1 mg at 04/10/22 1059  ? losartan (COZAAR) tablet 25 mg  25 mg Oral Daily Dorie Rank, MD   25 mg at 04/10/22 1058  ? melatonin tablet 3 mg  3 mg Oral QHS Dorie Rank, MD   3 mg at 04/09/22 2205  ? methimazole (TAPAZOLE) tablet 10 mg  10 mg Oral TID Dorie Rank, MD   10 mg at 04/10/22 1058  ? OLANZapine (ZYPREXA) tablet 10 mg  10 mg Oral QHS Dorie Rank, MD   10 mg at 04/09/22 2205  ? ondansetron (ZOFRAN) tablet 4 mg  4 mg Oral Q8H PRN Loeffler, Grace C, PA-C      ? pantoprazole (PROTONIX) EC tablet 40 mg  40 mg Oral Daily Dorie Rank, MD   40 mg at 04/10/22 1059  ? propranolol (INDERAL) tablet 10 mg  10 mg Oral BID Dorie Rank, MD   10 mg at 04/10/22 1059  ? traZODone (DESYREL) tablet 50 mg  50 mg Oral QHS Charmaine Downs C, NP   50 mg at 04/09/22 2206  ? ?Current Outpatient Medications  ?Medication Sig Dispense Refill  ? benztropine (COGENTIN) 0.5 MG tablet Take 0.5 mg by mouth 2 (two) times daily. (Patient not taking: Reported on 04/10/2022)    ? haloperidol (HALDOL) 5 MG tablet Take 1 tablet (5 mg total) by mouth daily. (Patient not taking: Reported on 04/10/2022) 30 tablet 0  ? LORazepam (ATIVAN) 1 MG tablet Take 1 mg by mouth 3 (three) times daily. (Patient not taking: Reported on 04/10/2022)    ? losartan (COZAAR) 25 MG tablet Take 25 mg by mouth daily. (Patient not taking: Reported on 04/10/2022)    ? melatonin 3 MG TABS tablet Take 3 mg by mouth at bedtime. (Patient not taking: Reported on 04/10/2022)    ? meloxicam (MOBIC) 15 MG tablet  Take 15 mg by mouth daily. (Patient not taking: Reported on 04/10/2022)    ? methimazole (TAPAZOLE) 10 MG tablet Take 10 mg  by mouth 3 (three) times daily. (Patient not taking: Reported on 04/10/2022)    ? OLANZapine (ZYPREXA) 10 MG tablet Take 10 mg by mouth at bedtime. (Patient not taking: Reported on 04/10/2022)    ? pantoprazole (PROTONIX) 40 MG tablet Take 40 mg by mouth daily. (Patient not taking: Reported on 04/10/2022)    ? propranolol (INDERAL) 10 MG tablet Take 10 mg by mouth 2 (two) times daily. (Patient not taking: Reported on 04/10/2022)    ? traZODone (DESYREL) 50 MG tablet Take 50 mg by mouth at bedtime. (Patient not taking: Reported on 04/10/2022)    ? ? ?Lab Results: No results found for this or any previous visit (from the past 48 hour(s)). ? ?Blood Alcohol level:  ?Lab Results  ?Component Value Date  ? ETH <10 04/07/2022  ? ETH <10 03/15/2022  ? ? ?Physical Findings: ?AIMS:  , ,  ,  ,    ?CIWA:    ?COWS:    ? ?Musculoskeletal: ?Strength & Muscle Tone: within normal limits ?Gait & Station: normal ?Patient leans: Front ? ?Psychiatric Specialty Exam: ? ?Presentation  ?General Appearance: Appropriate for Environment; Casual ? ?Eye Contact:Fair ? ?Speech:Clear and Coherent (Short term memory is grossly impaired.) ? ?Speech Volume:Normal ? ?Handedness:Right ? ? ?Mood and Affect  ?Mood:Depressed; Anxious ? ?Affect:Congruent; Restricted ? ? ?Thought Process  ?Thought Processes:Disorganized ? ?Descriptions of Associations:Circumstantial ? ?Orientation:Partial ? ?Thought Content:Logical ? ?History of Schizophrenia/Schizoaffective disorder:Yes ? ?Duration of Psychotic Symptoms:Greater than six months ? ?Hallucinations:Hallucinations: None ? ?Ideas of Reference:None ? ?Suicidal Thoughts:Suicidal Thoughts: No ? ?Homicidal Thoughts:Homicidal Thoughts: No ? ? ?Sensorium  ?Memory:Immediate Fair; Recent Fair; Remote Poor ? ?Judgment:Fair ? ?Insight:Good ? ? ?Executive Functions  ?Concentration:Good ? ?Attention  Span:Good ? ?Recall:Poor ? ?[place] ? ?Language:Good ? ? ?Psychomotor Activity  ?Psychomotor Activity:Psychomotor Activity: Normal ? ? ?Assets  ?Assets:Communication Skills; Desire for Improvement; [place]

## 2022-04-10 NOTE — Progress Notes (Signed)
Patient has been faxed out due to no appropriate beds available. Patient meets [place] inpatient criteria per Quintella Reichert, NP. Patient has been faxed out to the following facilities:  ? ?[facility]  [address] [place] [address] [phone]  ?[place]  [address] [place] [address] [phone]  ?[facility]  [address], [place] [address] [phone]  ?[facility] [address] [place] [address] [phone]  ?[facility]  [address] [place] [address] [phone]  ?[place]  [place], [place] [address] [phone]  ?[facility]  [address] [place], [place] [address] [phone]  ?[facility]  [address] [place] [address] [phone]  ?[facility]  [address], [place] [address] [phone]  ?[place]  [address] [place]  [address] [phone]  ?[facility]  [address], [place] [address] [phone]  ?[facility]  [address] [place] [address] [phone]  ?[place]  [place] [place] [address] [phone]  ?[facility]  [address] [place] [address] [phone]  ?[facility]  [place], [place] [address] [phone]  ? ?Mariea Clonts, MSW, LCSW-A  ?9:48 AM 04/10/2022   ?

## 2022-04-10 NOTE — ED Provider Notes (Signed)
Emergency Medicine Observation Re-evaluation Note ? ?Paula Dickson is a 56 y.o. female, seen on rounds today.  Pt initially presented to the ED for complaints of Suicidal ?Currently, the patient is pending psychiatric placement. ? ?Physical Exam  ?BP 124/82 (BP Location: Left Arm)   Pulse 79   Temp 97.9 ?F (36.6 ?C) (Oral)   Resp 18   SpO2 99%  ?Physical Exam ?General: resting ?Cardiac: rrr ?Lungs: cta ?Psych: sleeping ? ?ED Course / MDM  ?EKG:EKG Interpretation ? ?Date/Time:  Saturday Apr 08 2022 01:04:20 EDT ?Ventricular Rate:  87 ?PR Interval:    ?QRS Duration: 86 ?QT Interval:  371 ?QTC Calculation: 447 ?R Axis:   3 ?Text Interpretation: Atrial flutter with predominant 3:1 AV block Low voltage, precordial leads Abnormal R-wave progression, early transition Nonspecific T abnormalities, anterior leads No significant change since last tracing Confirmed by Deno Etienne [phone] on 04/08/2022 6:44:54 AM ? ?I have reviewed the labs performed to date as well as medications administered while in observation.  Recent changes in the last 24 hours include none. ? ?Plan  ?Current plan is for waiting for [facility] bed. ? Yatzil Clippinger Colina is not under involuntary commitment. ? ? ?  ?Pattricia Boss, MD ?04/10/22 1002 ? ?

## 2022-04-11 LAB — RAPID URINE DRUG SCREEN, HOSP PERFORMED
Amphetamines: NOT DETECTED
Barbiturates: NOT DETECTED
Benzodiazepines: POSITIVE — AB
Cocaine: NOT DETECTED
Opiates: NOT DETECTED
Tetrahydrocannabinol: NOT DETECTED

## 2022-04-11 NOTE — Progress Notes (Signed)
Patient has been denied by [facility] and has been faxed out. Patient meets [place] inpatient criteria per Quintella Reichert, NP. Patient has been faxed out to the following facilities:  ? ?[facility]  [address] [place] [address] [phone]  ?[place]  [address] [place] [address] [phone]  ?[facility]  [address], [place] [address] [phone]  ?[facility] [address] [place] [address] [phone]  ?[facility]  [address] [place] [address] [phone]  ?[place]  [place], [place] [address] [phone]  ?[facility]  [address] [place] [address] [place], [place] [address] [phone]  ?[facility]  [address] [place] [address] [phone]  ?[facility]  [address], [place] [address] [phone]  ?[place]  [address] [place] [address] [phone]  ?[facility]  [address] [place] [address] [phone]  ?[facility]  [address], [place] [address] [phone]  ?[place]  [address] [place] [address] [phone]  ?[facility]  [address] [place] [address] [phone]  ?[facility]  [place], [place] [address] [phone]  ? ?Mariea Clonts, MSW, LCSW-A  ?9:40 AM 04/11/2022   ?

## 2022-04-11 NOTE — Progress Notes (Signed)
CSW faxed additional information requested by [facility]. 1st shift to follow up. ? ?Benjaman Kindler, MSW, LCSWA ?04/11/2022 12:11 AM ? ? ?

## 2022-04-11 NOTE — Progress Notes (Signed)
CSW contacted [facility] at [phone] but was unable to speak with the Intake Department. CSW left a voice message asking someone to return the call. CSW is awaiting to hear back currently.  ? ? ?Mariea Clonts, MSW, LCSW-A  ?9:37 AM 04/11/2022   ?

## 2022-04-11 NOTE — ED Notes (Signed)
Patient alert this shift. Patient cooperative with care.  Flat and guarded. Patient endorses being hopeless.  Sleeping during shift.  ?

## 2022-04-11 NOTE — Progress Notes (Signed)
CSW followed up with Pilar Plate at [facility] who reports that pt is still under review and that there is no beds available. CSW was advised to have 1st shift resend the referral and to follow up due to upcoming discharges. ? ?Benjaman Kindler, MSW, LCSWA ?04/11/2022 11:34 PM ? ? ?

## 2022-04-12 DIAGNOSIS — F250 Schizoaffective disorder, bipolar type: Secondary | ICD-10-CM

## 2022-04-12 LAB — URINALYSIS, ROUTINE W REFLEX MICROSCOPIC
Bilirubin Urine: NEGATIVE
Glucose, UA: NEGATIVE mg/dL
Hgb urine dipstick: NEGATIVE
Ketones, ur: NEGATIVE mg/dL
Leukocytes,Ua: NEGATIVE
Nitrite: NEGATIVE
Protein, ur: NEGATIVE mg/dL
Specific Gravity, Urine: 1.006 (ref 1.005–1.030)
pH: 6 (ref 5.0–8.0)

## 2022-04-12 LAB — RESP PANEL BY RT-PCR (FLU A&B, COVID) ARPGX2
Influenza A by PCR: NEGATIVE
Influenza B by PCR: NEGATIVE
SARS Coronavirus 2 by RT PCR: NEGATIVE

## 2022-04-12 NOTE — Consult Note (Signed)
[facility] Psych ED Progress Note ? ?04/12/2022 1:49 PM ?[place]  ?MRN:  [id number] ? ? ?Method of visit?: Face to Face  ? ?Subjective:  Patient was seen in room awake but per Nursing staff she sleeps most of the time and have not taken shower in 2-3 days.  She is taking her medications but does not seem to clear up.  This morning she exhibited much thought blocking.  Patient starts a sentence and half way stops ands cannot finish.  She has flight of ideas jumping from one topic to another.  She only stated that she need somebody to teach her how to take her medications.  She also stated that she was set up as a Murder but could not explain what she was talking about.  She will receive Haldol Decanoate on Friday.  We continue to seek inpatient hospitalization and before discharge from any hospital patient need to be re introduce with ACT Team. ?Principal Problem: Schizoaffective disorder, bipolar type ([place]) ?Diagnosis:  Principal Problem: ?  Schizoaffective disorder, bipolar type ([place]) ? ?Total Time spent with patient: 20 minutes ? ?Past Psychiatric History: See initial Psych evaluation note ? ?Past Medical History:  ?Past Medical History:  ?Diagnosis Date  ? Bipolar affective disorder ([place])   ? Bipolar disorder ([employer])   ? History of arthritis   ? History of chicken pox   ? History of depression   ? History of genital warts   ? history of heart murmur   ? History of high blood pressure   ? History of thyroid disease   ? History of UTI   ? Hypertension   ? Low TSH level 07/13/2017  ? Schizophrenia ([place])   ?  ?Past Surgical History:  ?Procedure Laterality Date  ? ABLATION ON ENDOMETRIOSIS    ? CYST REMOVAL NECK    ? around 11 years ago /benign  ? MULTIPLE TOOTH EXTRACTIONS    ? ?Family History:  ?Family History  ?Problem Relation Age of Onset  ? Arthritis Father   ? Hyperlipidemia Father   ? High blood pressure Father   ? Diabetes Sister   ? Diabetes Mother   ? Diabetes Brother   ? Mental illness Brother   ?  Alcohol abuse Paternal Uncle   ? Alcohol abuse Paternal Grandfather   ? Breast cancer Maternal Aunt   ? Breast cancer Paternal Aunt   ? High blood pressure Sister   ? Mental illness Other   ?     runs in family  ? ?Family Psychiatric  History: see initial Psych evaluation note ?Social History:  ?Social History  ? ?Substance and Sexual Activity  ?Alcohol Use Not Currently  ?   ?Social History  ? ?Substance and Sexual Activity  ?Drug Use Not Currently  ?  ?Social History  ? ?Socioeconomic History  ? Marital status: Single  ?  Spouse name: Not on file  ? Number of children: Not on file  ? Years of education: Not on file  ? Highest education level: Patient refused  ?Occupational History  ? Not on file  ?Tobacco Use  ? Smoking status: Never  ? Smokeless tobacco: Never  ?Vaping Use  ? Vaping Use: Never used  ?Substance and Sexual Activity  ? Alcohol use: Not Currently  ? Drug use: Not Currently  ? Sexual activity: Not Currently  ?Other Topics Concern  ? Not on file  ?Social History Narrative  ? ** Merged History Encounter **  ?    ? ?  Social Determinants of Health  ? ?Financial Resource Strain: Not on file  ?Food Insecurity: Not on file  ?Transportation Needs: Not on file  ?Physical Activity: Not on file  ?Stress: Not on file  ?Social Connections: Not on file  ? ? ?Sleep: Good ? ?Appetite:  Fair ? ?Current Medications: ?Current Facility-Administered Medications  ?Medication Dose Route Frequency Provider Last Rate Last Admin  ? acetaminophen (TYLENOL) tablet 650 mg  650 mg Oral Q4H PRN Loeffler, Grace C, PA-C      ? busPIRone (BUSPAR) tablet 7.5 mg  7.5 mg Oral BID Carrera Kiesel C, NP   7.5 mg at 04/12/22 1012  ? haloperidol (HALDOL) tablet 5 mg  5 mg Oral QHS Adream Parzych C, NP   5 mg at 04/11/22 2124  ? hydrOXYzine (ATARAX) tablet 25 mg  25 mg Oral TID PRN Delfin Gant, NP      ? LORazepam (ATIVAN) tablet 1 mg  1 mg Oral TID Dorie Rank, MD   1 mg at 04/12/22 1014  ? losartan (COZAAR) tablet 25 mg  25 mg  Oral Daily Dorie Rank, MD   25 mg at 04/12/22 1012  ? melatonin tablet 3 mg  3 mg Oral QHS Dorie Rank, MD   3 mg at 04/11/22 2125  ? methimazole (TAPAZOLE) tablet 10 mg  10 mg Oral TID Dorie Rank, MD   10 mg at 04/12/22 1012  ? OLANZapine (ZYPREXA) tablet 10 mg  10 mg Oral QHS Dorie Rank, MD   10 mg at 04/11/22 2124  ? ondansetron (ZOFRAN) tablet 4 mg  4 mg Oral Q8H PRN Loeffler, Grace C, PA-C      ? pantoprazole (PROTONIX) EC tablet 40 mg  40 mg Oral Daily Dorie Rank, MD   40 mg at 04/12/22 1014  ? propranolol (INDERAL) tablet 10 mg  10 mg Oral BID Dorie Rank, MD   10 mg at 04/12/22 1026  ? traZODone (DESYREL) tablet 50 mg  50 mg Oral QHS Charmaine Downs C, NP   50 mg at 04/11/22 2125  ? ?Current Outpatient Medications  ?Medication Sig Dispense Refill  ? benztropine (COGENTIN) 0.5 MG tablet Take 0.5 mg by mouth 2 (two) times daily. (Patient not taking: Reported on 04/10/2022)    ? haloperidol (HALDOL) 5 MG tablet Take 1 tablet (5 mg total) by mouth daily. (Patient not taking: Reported on 04/10/2022) 30 tablet 0  ? LORazepam (ATIVAN) 1 MG tablet Take 1 mg by mouth 3 (three) times daily. (Patient not taking: Reported on 04/10/2022)    ? losartan (COZAAR) 25 MG tablet Take 25 mg by mouth daily. (Patient not taking: Reported on 04/10/2022)    ? melatonin 3 MG TABS tablet Take 3 mg by mouth at bedtime. (Patient not taking: Reported on 04/10/2022)    ? meloxicam (MOBIC) 15 MG tablet Take 15 mg by mouth daily. (Patient not taking: Reported on 04/10/2022)    ? methimazole (TAPAZOLE) 10 MG tablet Take 10 mg by mouth 3 (three) times daily. (Patient not taking: Reported on 04/10/2022)    ? OLANZapine (ZYPREXA) 10 MG tablet Take 10 mg by mouth at bedtime. (Patient not taking: Reported on 04/10/2022)    ? pantoprazole (PROTONIX) 40 MG tablet Take 40 mg by mouth daily. (Patient not taking: Reported on 04/10/2022)    ? propranolol (INDERAL) 10 MG tablet Take 10 mg by mouth 2 (two) times daily. (Patient not taking: Reported on 04/10/2022)     ? traZODone (DESYREL) 50 MG tablet Take  50 mg by mouth at bedtime. (Patient not taking: Reported on 04/10/2022)    ? ? ?Lab Results:  ?Results for orders placed or performed during the hospital encounter of 04/07/22 (from the past 48 hour(s))  ?Rapid urine drug screen (hospital performed)     Status: Abnormal  ? Collection Time: 04/11/22 11:46 AM  ?Result Value Ref Range  ? Opiates NONE DETECTED NONE DETECTED  ? Cocaine NONE DETECTED NONE DETECTED  ? Benzodiazepines POSITIVE (A) NONE DETECTED  ? Amphetamines NONE DETECTED NONE DETECTED  ? Tetrahydrocannabinol NONE DETECTED NONE DETECTED  ? Barbiturates NONE DETECTED NONE DETECTED  ?  Comment: (NOTE) ?DRUG SCREEN FOR MEDICAL PURPOSES ?ONLY.  IF CONFIRMATION IS NEEDED ?FOR ANY PURPOSE, NOTIFY LAB ?WITHIN 5 DAYS. ? ?LOWEST DETECTABLE LIMITS ?FOR URINE DRUG SCREEN ?Drug Class                     Cutoff (ng/mL) ?Amphetamine and metabolites    1000 ?Barbiturate and metabolites    200 ?Benzodiazepine                 200 ?Tricyclics and metabolites     300 ?Opiates and metabolites        300 ?Cocaine and metabolites        300 ?THC                            50 ?Performed at [facility], [address] ?[place], [place] [address] ?  ? ? ?Blood Alcohol level:  ?Lab Results  ?Component Value Date  ? ETH <10 04/07/2022  ? ETH <10 03/15/2022  ? ? ?Physical Findings: ?AIMS:  , ,  ,  ,    ?CIWA:    ?COWS:    ? ?Musculoskeletal: ?Strength & Muscle Tone: within normal limits ?Gait & Station: normal ?Patient leans: Front ? ?Psychiatric Specialty Exam: ? ?Presentation  ?General Appearance: Disheveled; Casual ? ?Eye Contact:Fair ? ?Speech:Slow (disorganized, difficult to understand) ? ?Speech Volume:Decreased ? ?Handedness:Right ? ? ?Mood and Affect  ?Mood:Anxious; Dysphoric ? ?Affect:Congruent ? ? ?Thought Process  ?Thought Processes:Disorganized ? ?Descriptions of Associations:Circumstantial ? ?Orientation:Partial ? ?Thought Content:Illogical ? ?History of  Schizophrenia/Schizoaffective disorder:Yes ? ?Duration of Psychotic Symptoms:Greater than six months ? ?Hallucinations:Hallucinations: None ? ? ?Ideas of Reference:None ? ?Suicidal Thoughts:Suicidal Thoughts: N

## 2022-04-12 NOTE — Progress Notes (Signed)
04/12/2022  1624  Report called to Ellin Mayhew [phone]. Report given to [facility]. Waiting for COVID results to D/C. ?

## 2022-04-12 NOTE — Progress Notes (Signed)
Patient has been denied by [facility] due to no appropriate beds available. Patient meets [facility] inpatient criteria per Quintella Reichert, NP. Patient has been faxed out to the following facilities:  ? ?[facility]  [address] [place] [address] [phone]  ?[facility]  [address], [place] [address] [phone]  ?[facility]  [address] [place] [address] [phone]  ?[facility] [address] [place] [address] [phone]  ?[facility]  [address], [place] [address] [phone]  ?[facility]  [place], [place] [address] [phone]  ?[facility]  [address] [place], [place] [address] [phone]  ?[facility]  [address] [place] [address] [phone]  ?[facility]  [address], [place] [address] [phone]  ?[facility]  [name], [place] [address] [phone]  ?[facility]  [address], [place] [address] [phone]  ?[facility]  [address] [place] [address] [phone]  ?[facility]  [address] [place] [address] [phone]  ?[facility]  [address], [place] [address] [phone]  ?[facility]  [place], [place] [address] [phone]  ? ?Mariea Clonts, MSW, LCSW-A  ?11:59 AM 04/12/2022   ?

## 2022-04-12 NOTE — Progress Notes (Signed)
[facility] LCSW Progress Note ?  ?04/12/2022    2:16 PM ? ?Paula Dickson  ? ?[id number]  ? ?Type of Contact and Topic:  Psychiatric Bed Placement  ? ?Pt accepted to [facility]    ? ?Patient meets inpatient criteria per Charmaine Downs, NP ? ?The attending provider will be Dr. Alcide Clever  ? ?Call report to [phone] or [phone] ? ?Vivi Ferns, RN @ [facility] ED  notified.    ? ?Pt scheduled  to arrive at [facility] ANYTIME.  ? ?Mariea Clonts, MSW, LCSW-A  ?2:18 PM 04/12/2022   ?  ? ?  ?  ? ? ? ? ?  ?

## 2022-04-12 NOTE — Progress Notes (Signed)
04/12/2022  [facility] transport [phone] to transport patient to [facility]. ?

## 2022-04-13 LAB — URINE CULTURE

## 2022-05-12 ENCOUNTER — Emergency Department (HOSPITAL_COMMUNITY)
Admission: EM | Admit: 2022-05-12 | Discharge: 2022-05-12 | Disposition: A | Discharge status: Home / Self Care | Payer: Medicare Other | Attending: Emergency Medicine | Admitting: Emergency Medicine

## 2022-05-12 ENCOUNTER — Encounter (HOSPITAL_COMMUNITY): Payer: Self-pay

## 2022-05-12 ENCOUNTER — Other Ambulatory Visit: Payer: Self-pay

## 2022-05-12 DIAGNOSIS — Z91128 Patient's intentional underdosing of medication regimen for other reason: Secondary | ICD-10-CM | POA: Diagnosis not present

## 2022-05-12 DIAGNOSIS — F319 Bipolar disorder, unspecified: Secondary | ICD-10-CM | POA: Diagnosis not present

## 2022-05-12 DIAGNOSIS — Z91148 Patient's other noncompliance with medication regimen for other reason: Secondary | ICD-10-CM | POA: Insufficient documentation

## 2022-05-12 DIAGNOSIS — T43216A Underdosing of selective serotonin and norepinephrine reuptake inhibitors, initial encounter: Secondary | ICD-10-CM | POA: Diagnosis not present

## 2022-05-12 DIAGNOSIS — Z79899 Other long term (current) drug therapy: Secondary | ICD-10-CM | POA: Diagnosis not present

## 2022-05-12 DIAGNOSIS — T43596A Underdosing of other antipsychotics and neuroleptics, initial encounter: Secondary | ICD-10-CM | POA: Diagnosis not present

## 2022-05-12 DIAGNOSIS — Z09 Encounter for follow-up examination after completed treatment for conditions other than malignant neoplasm: Secondary | ICD-10-CM | POA: Diagnosis not present

## 2022-05-12 DIAGNOSIS — T43226A Underdosing of selective serotonin reuptake inhibitors, initial encounter: Secondary | ICD-10-CM | POA: Insufficient documentation

## 2022-05-12 NOTE — ED Provider Notes (Signed)
[facility] DEPT Provider Note   CSN: [id number] Arrival date & time: 05/12/22  1528     History No chief complaint on file.   Paula Dickson is a 56 y.o. female with history of bipolar disorder, bipolar affective disorder, and schizophrenia who presents to the emergency department today for medication adherence.  She is brought in by her sister because she has not been taking medications for the last week.  When questioned, the patient does not know why she is not taking her medications and states that she has been taking them but has been taking them incorrectly.  Sister has since left the department.  Patient denies any homicidal or suicidal ideations or auditory/visual hallucinations.  She denies any somatic complaints at this time.  HPI     Home Medications Prior to Admission medications   Medication Sig Start Date End Date Taking? Authorizing Provider  benztropine (COGENTIN) 0.5 MG tablet Take 0.5 mg by mouth 2 (two) times daily. Patient not taking: Reported on 04/10/2022 03/07/22   [provider]  haloperidol (HALDOL) 5 MG tablet Take 1 tablet (5 mg total) by mouth daily. Patient not taking: Reported on 04/10/2022 12/24/21   Freida Busman, MD  LORazepam (ATIVAN) 1 MG tablet Take 1 mg by mouth 3 (three) times daily. Patient not taking: Reported on 04/10/2022 01/04/22   [provider]  losartan (COZAAR) 25 MG tablet Take 25 mg by mouth daily. Patient not taking: Reported on 04/10/2022 01/05/22   [provider]  melatonin 3 MG TABS tablet Take 3 mg by mouth at bedtime. Patient not taking: Reported on 04/10/2022    [provider]  meloxicam (MOBIC) 15 MG tablet Take 15 mg by mouth daily. Patient not taking: Reported on 04/10/2022    [provider]  methimazole (TAPAZOLE) 10 MG tablet Take 10 mg by mouth 3 (three) times daily. Patient not taking: Reported on 04/10/2022 01/26/22   [provider]   OLANZapine (ZYPREXA) 10 MG tablet Take 10 mg by mouth at bedtime. Patient not taking: Reported on 04/10/2022 11/04/21   [provider]  pantoprazole (PROTONIX) 40 MG tablet Take 40 mg by mouth daily. Patient not taking: Reported on 04/10/2022 03/07/22   [provider]  propranolol (INDERAL) 10 MG tablet Take 10 mg by mouth 2 (two) times daily. Patient not taking: Reported on 04/10/2022 01/04/22   [provider]  traZODone (DESYREL) 50 MG tablet Take 50 mg by mouth at bedtime. Patient not taking: Reported on 04/10/2022 03/07/22   [provider]      Allergies    Patient has no known allergies.    Review of Systems   Review of Systems  All other systems reviewed and are negative.   Physical Exam Updated Vital Signs BP (!) 158/101 (BP Location: Right Arm)   Pulse (!) 105   Temp 98.4 F (36.9 C) (Oral)   Resp 19   Ht '5\' 1"'$  (1.549 m)   Wt 75 kg   SpO2 99%   BMI 31.24 kg/m  Physical Exam Vitals and nursing note reviewed.  Constitutional:      General: She is not in acute distress.    Appearance: Normal appearance.  HENT:     Head: Normocephalic and atraumatic.  Eyes:     General:        Right eye: No discharge.        Left eye: No discharge.  Cardiovascular:     Comments: Regular  rate and rhythm.  S1/S2 are distinct without any evidence of murmur, rubs, or gallops.  Radial pulses are 2+ bilaterally.  Dorsalis pedis pulses are 2+ bilaterally.  No evidence of pedal edema. Pulmonary:     Comments: Clear to auscultation bilaterally.  Normal effort.  No respiratory distress.  No evidence of wheezes, rales, or rhonchi heard throughout. Abdominal:     General: Abdomen is flat. Bowel sounds are normal. There is no distension.     Tenderness: There is no abdominal tenderness. There is no guarding or rebound.  Musculoskeletal:        General: Normal range of motion.     Cervical back: Neck supple.  Skin:    General: Skin is warm and dry.      Findings: No rash.  Neurological:     General: No focal deficit present.     Mental Status: She is alert.  Psychiatric:        Mood and Affect: Mood normal.        Behavior: Behavior normal.     ED Results / Procedures / Treatments   Labs (all labs ordered are listed, but only abnormal results are displayed) Labs Reviewed - No data to display  EKG None  Radiology No results found.  Procedures Procedures    Medications Ordered in ED Medications - No data to display  ED Course/ Medical Decision Making/ A&P                           Medical Decision Making Paula Dickson is a 56 y.o. female who presents to the emergency department today for medication adherence.  Patient has no complaints at this time nor does she have any grounds for inpatient IVC today.  She does not meet inpatient criteria for psychiatric evaluation.  She has no auditory or visual hallucinations.  Patient will be discharged home.    Final Clinical Impression(s) / ED Diagnoses Final diagnoses:  Nonadherence to medication    Rx / DC Orders ED Discharge Orders     None         Hendricks Limes, [place] 05/12/22 1654    Horton, Alvin Critchley, DO 05/12/22 2011

## 2022-05-12 NOTE — ED Notes (Signed)
Sister, Paula Dickson, was called to pick patient up due to patient being discharged. Sister did not agree with patient being discharged as patient is still not taking medications as prescribed. It was explained to patient's sister that that is not a reason to admit the patient. Patient was provided with resources to follow up with.

## 2022-05-12 NOTE — ED Triage Notes (Signed)
Patient BIB sister. Sister states patient has not been complaint with medication every since discharge [facility]. Sister states patient has not been taking Zyprexa, zoloft, and desyrel as prescribed. Patient has no complaints at this time.

## 2022-05-12 NOTE — Discharge Instructions (Signed)
Please follow-up with [facility] for further evaluation.

## 2022-07-12 ENCOUNTER — Encounter (HOSPITAL_COMMUNITY): Payer: Self-pay

## 2022-07-12 ENCOUNTER — Other Ambulatory Visit: Payer: Self-pay

## 2022-07-12 ENCOUNTER — Emergency Department (HOSPITAL_COMMUNITY)
Admission: EM | Admit: 2022-07-12 | Discharge: 2022-07-14 | Disposition: A | Discharge status: Other Acute Inpatient Hospital | Payer: Medicare Other | Attending: Emergency Medicine | Admitting: Emergency Medicine

## 2022-07-12 DIAGNOSIS — Z20822 Contact with and (suspected) exposure to covid-19: Secondary | ICD-10-CM | POA: Diagnosis not present

## 2022-07-12 DIAGNOSIS — E039 Hypothyroidism, unspecified: Secondary | ICD-10-CM | POA: Insufficient documentation

## 2022-07-12 DIAGNOSIS — Z79899 Other long term (current) drug therapy: Secondary | ICD-10-CM | POA: Diagnosis not present

## 2022-07-12 DIAGNOSIS — Z046 Encounter for general psychiatric examination, requested by authority: Secondary | ICD-10-CM | POA: Diagnosis present

## 2022-07-12 DIAGNOSIS — F250 Schizoaffective disorder, bipolar type: Secondary | ICD-10-CM | POA: Diagnosis not present

## 2022-07-12 DIAGNOSIS — I10 Essential (primary) hypertension: Secondary | ICD-10-CM | POA: Insufficient documentation

## 2022-07-12 DIAGNOSIS — F29 Unspecified psychosis not due to a substance or known physiological condition: Secondary | ICD-10-CM | POA: Insufficient documentation

## 2022-07-12 LAB — COMPREHENSIVE METABOLIC PANEL
ALT: 18 U/L (ref 0–44)
AST: 17 U/L (ref 15–41)
Albumin: 4.2 g/dL (ref 3.5–5.0)
Alkaline Phosphatase: 78 U/L (ref 38–126)
Anion gap: 12 (ref 5–15)
BUN: 8 mg/dL (ref 6–20)
CO2: 26 mmol/L (ref 22–32)
Calcium: 10.1 mg/dL (ref 8.9–10.3)
Chloride: 109 mmol/L (ref 98–111)
Creatinine, Ser: 1.1 mg/dL — ABNORMAL HIGH (ref 0.44–1.00)
GFR, Estimated: 58 mL/min — ABNORMAL LOW (ref >60)
Glucose, Bld: 88 mg/dL (ref 70–99)
Potassium: 3.9 mmol/L (ref 3.5–5.1)
Sodium: 147 mmol/L — ABNORMAL HIGH (ref 135–145)
Total Bilirubin: 1.4 mg/dL — ABNORMAL HIGH (ref 0.3–1.2)
Total Protein: 7.1 g/dL (ref 6.5–8.1)

## 2022-07-12 LAB — CBC WITH DIFFERENTIAL/PLATELET
Abs Immature Granulocytes: 0.01 10*3/uL (ref 0.00–0.07)
Basophils Absolute: 0 10*3/uL (ref 0.0–0.1)
Basophils Relative: 1 %
Eosinophils Absolute: 0.1 10*3/uL (ref 0.0–0.5)
Eosinophils Relative: 2 %
HCT: 41.7 % (ref 36.0–46.0)
Hemoglobin: 13.3 g/dL (ref 12.0–15.0)
Immature Granulocytes: 0 %
Lymphocytes Relative: 29 %
Lymphs Abs: 1.8 10*3/uL (ref 0.7–4.0)
MCH: 28.1 pg (ref 26.0–34.0)
MCHC: 31.9 g/dL (ref 30.0–36.0)
MCV: 88 fL (ref 80.0–100.0)
Monocytes Absolute: 0.5 10*3/uL (ref 0.1–1.0)
Monocytes Relative: 8 %
Neutro Abs: 3.9 10*3/uL (ref 1.7–7.7)
Neutrophils Relative %: 60 %
Platelets: 219 10*3/uL (ref 150–400)
RBC: 4.74 MIL/uL (ref 3.87–5.11)
RDW: 12.9 % (ref 11.5–15.5)
WBC: 6.4 10*3/uL (ref 4.0–10.5)
nRBC: 0 % (ref 0.0–0.2)

## 2022-07-12 LAB — ETHANOL: Alcohol, Ethyl (B): <10 mg/dL (ref <10)

## 2022-07-12 LAB — RESP PANEL BY RT-PCR (FLU A&B, COVID) ARPGX2
Influenza A by PCR: NEGATIVE
Influenza B by PCR: NEGATIVE
SARS Coronavirus 2 by RT PCR: NEGATIVE

## 2022-07-12 LAB — SALICYLATE LEVEL: Salicylate Lvl: <7 mg/dL — ABNORMAL LOW (ref 7.0–30.0)

## 2022-07-12 LAB — ACETAMINOPHEN LEVEL: Acetaminophen (Tylenol), Serum: <10 ug/mL — ABNORMAL LOW (ref 10–30)

## 2022-07-12 MED ORDER — ZIPRASIDONE MESYLATE 20 MG IM SOLR
20.0000 mg | Freq: Once | INTRAMUSCULAR | Status: AC
  Administered 2022-07-12: 20 mg via INTRAMUSCULAR
  Filled 2022-07-12: qty 20

## 2022-07-12 MED ORDER — LORAZEPAM 2 MG/ML IJ SOLN
2.0000 mg | Freq: Once | INTRAMUSCULAR | Status: AC
Start: 2022-07-12 — End: 2022-07-12
  Administered 2022-07-12: 2 mg via INTRAMUSCULAR
  Filled 2022-07-12: qty 1
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
## 2022-07-13 DIAGNOSIS — F250 Schizoaffective disorder, bipolar type: Secondary | ICD-10-CM

## 2022-07-13 MED ORDER — STERILE WATER FOR INJECTION IJ SOLN
INTRAMUSCULAR | Status: AC
  Administered 2022-07-13: 10 mL
  Filled 2022-07-13: qty 10

## 2022-07-13 MED ORDER — ZIPRASIDONE MESYLATE 20 MG IM SOLR
20.0000 mg | Freq: Two times a day (BID) | INTRAMUSCULAR | Status: DC | PRN
  Administered 2022-07-13: 20 mg via INTRAMUSCULAR
  Filled 2022-07-13: qty 20

## 2022-07-13 MED ORDER — HALOPERIDOL 5 MG PO TABS
5.0000 mg | ORAL_TABLET | Freq: Every day | ORAL | Status: DC
Start: 2022-07-13 — End: 2022-07-14
  Filled 2022-07-13: qty 1

## 2022-07-13 MED ORDER — HALOPERIDOL LACTATE 5 MG/ML IJ SOLN
5.0000 mg | Freq: Once | INTRAMUSCULAR | Status: AC
  Administered 2022-07-13: 5 mg via INTRAMUSCULAR
  Filled 2022-07-13: qty 1

## 2022-07-13 MED ORDER — HALOPERIDOL 5 MG PO TABS
10.0000 mg | ORAL_TABLET | Freq: Every day | ORAL | Status: DC
Start: 2022-07-13 — End: 2022-07-14
  Filled 2022-07-13: qty 2

## 2022-07-13 MED ORDER — LORAZEPAM 1 MG PO TABS
1.0000 mg | ORAL_TABLET | Freq: Two times a day (BID) | ORAL | Status: DC | PRN
  Filled 2022-07-13: qty 1

## 2022-07-13 MED ORDER — LORAZEPAM 2 MG/ML IJ SOLN
2.0000 mg | Freq: Once | INTRAMUSCULAR | Status: AC
  Administered 2022-07-13: 2 mg via INTRAMUSCULAR
  Filled 2022-07-13: qty 1

## 2022-07-13 MED ORDER — BENZTROPINE MESYLATE 0.5 MG PO TABS
0.5000 mg | ORAL_TABLET | Freq: Two times a day (BID) | ORAL | Status: DC
Start: 2022-07-13 — End: 2022-07-14
  Filled 2022-07-13 (×2): qty 1

## 2022-07-13 NOTE — BH Assessment (Signed)
Per Dr. Dwyane Dee in the 9am morning huddle/bed meeting, Provider to assess. No TTS CCA is needed at this time.

## 2022-07-13 NOTE — BH Assessment (Signed)
@  0230 Per RN Jody, pt was given IM Geodon and cannot be aroused at this time.  RN to reach out to TTS once patient is alert and able to engage in assessment.

## 2022-07-13 NOTE — ED Notes (Signed)
Pt continues to leave her room and has to be redirected.  Pt continues to challenge staff and refuses her PO meds.

## 2022-07-13 NOTE — ED Notes (Signed)
Pt continues to yell at staff, she is getting louder and more paranoid.  Pt accuses staff of changing her room and that she is in danger.  Pt is no longer redirectable.  PRN medications administered.  See MAR for meds and time.

## 2022-07-13 NOTE — ED Notes (Signed)
Pt increasingly agitated since shift change. Repeatedly leaving room, yelling loudly, and being generally argumentative. Finally pt ran out of room and let out a high pitched scream. Spoke to edp for IM medication orders, as pt had refused all po meds and was given geodon IM nearly 4hrs ago to no affect. See mar.
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
## 2022-07-13 NOTE — ED Provider Notes (Signed)
Emergency Medicine Observation Re-evaluation Note  Paula Dickson is a 56 y.o. female, seen on rounds today.  Pt initially presented to the ED for complaints of IVC and Agression Currently, the patient is resting comfortably.  Physical Exam  BP 117/84 (BP Location: Left Arm)   Pulse 92   Temp 98.6 F (37 C) (Oral)   Resp 16   SpO2 96%  Physical Exam General: NAD   ED Course / MDM  EKG:EKG Interpretation  Date/Time:  Wednesday July 12 2022 21:33:54 EDT Ventricular Rate:  82 PR Interval:  134 QRS Duration: 82 QT Interval:  366 QTC Calculation: 427 R Axis:   5 Text Interpretation: Normal sinus rhythm Nonspecific T wave abnormality Abnormal ECG When compared with ECG of 08-Apr-2022 01:04, No significant change was found Confirmed by Garnette Gunner [phone]) on 07/12/2022 10:18:47 PM  I have reviewed the labs performed to date as well as medications administered while in observation.  Recent changes in the last 24 hours include Geodon administered for agitation at 0230.  Plan  Current plan is for placement. Paula Dickson is under involuntary commitment.      Valarie Merino, MD 07/13/22 [phone]

## 2022-07-13 NOTE — Progress Notes (Signed)
Pt was accepted to [facility] 07/14/22; Bed Assignment Unit 900.  CSW received accepting information from [place] with [facility]  Pt meets inpatient criteria per Lindon Romp, NP  Attending Physician will be Dr. Lavina Hamman   Report can be called to: [phone]  Pt can arrive after: 9:00 am  Nursing notified: Claretta Fraise, RN  Nadara Mode, [place] 07/13/2022 @ 5:12 PM

## 2022-07-13 NOTE — ED Notes (Signed)
PT has refused vital signs.  Pt will not take PO medications.  Pt has continually asked for food and showers, but refused everything provided to her.  Pt continues to argue with staff and walk from her room into hallway.  Pt remains redirectable with strong reinforcement.  Pt talking nonstop for most of the day.

## 2022-07-13 NOTE — ED Notes (Signed)
Pt continues to exit her room and walk toward nurses station.  When staff attempts to redirect pt, she yells at staff and states "I'm in [place], I have rights".  Pt then redirects to room with strong encouragement.
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
## 2022-07-13 NOTE — ED Notes (Signed)
Pt awakened by Corene Cornea, NP for interview.  After interview, pt comes out into hallway and states she needs to know where she is.  Pt reoriented to place, but states she is "not at [facility], I know [employer]  Pt states she needs a shower, and some nutritious food.  Pt continues to walk from room into hall.  Can be redirected, but with much verbal reinforcement.  Pt currently not physically or verbally aggressive, but is argumentative with staff.
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
## 2022-07-14 NOTE — ED Provider Notes (Signed)
Emergency Medicine Observation Re-evaluation Note  Paula Dickson is a 56 y.o. female, seen on rounds today.  Pt initially presented to the ED for complaints of IVC, agitated behavior. Pt currently calm, no distress. No new c/o this AM.   Physical Exam  BP 117/84 (BP Location: Left Arm)   Pulse 92   Temp 98.6 F (37 C) (Oral)   Resp 16   SpO2 96%  Physical Exam General: calm, nad. Cardiac: regular rate. Lungs: breathing comfortably. Psych: normal mood/affect.   ED Course / MDM   I have reviewed the labs performed to date as well as medications administered while in observation.  Recent changes in the last 24 hours include ED obs, reassessment.   Plan   Paula Dickson is under involuntary commitment.   Pt accepted to [facility], Dr Lavina Hamman.   Pt currently appears stable for transfer.    Lajean Saver, MD 07/14/22 [phone]

## 2022-07-14 NOTE — ED Notes (Signed)
Report called to [facility], transportation arranged at this time. Pt continues to rest quietly on bed, will monitor.

## 2022-07-14 NOTE — ED Notes (Signed)
Report received, care of pt assumed.  Pt lying in bed, eyes closed, resp even and unlabored.  Pt to transport to [facility] this AM after 9.  Will monitor, will obtain VSS upon waking.

## 2022-07-14 NOTE — ED Notes (Signed)
Pt awakened for VS for transport.  Pt saturated in urine.  Changed into dry scrubs and VS obtained.  Pt transported with [facility] at this time.

## 2022-07-14 NOTE — ED Notes (Signed)
Attempted to call report to [facility], no answer from nursing.  Will call back

## 2022-08-10 ENCOUNTER — Other Ambulatory Visit: Payer: Self-pay

## 2022-08-10 ENCOUNTER — Encounter (HOSPITAL_COMMUNITY): Payer: Self-pay

## 2022-08-10 ENCOUNTER — Inpatient Hospital Stay (HOSPITAL_COMMUNITY)
Admission: EM | Admit: 2022-08-10 | Discharge: 2022-08-17 | DRG: 683 | Disposition: A | Discharge status: Other Acute Inpatient Hospital | Payer: Medicare Other | Attending: Internal Medicine | Admitting: Internal Medicine

## 2022-08-10 DIAGNOSIS — Z833 Family history of diabetes mellitus: Secondary | ICD-10-CM

## 2022-08-10 DIAGNOSIS — E870 Hyperosmolality and hypernatremia: Secondary | ICD-10-CM | POA: Diagnosis present

## 2022-08-10 DIAGNOSIS — N179 Acute kidney failure, unspecified: Principal | ICD-10-CM | POA: Diagnosis present

## 2022-08-10 DIAGNOSIS — R000 Tachycardia, unspecified: Secondary | ICD-10-CM | POA: Diagnosis not present

## 2022-08-10 DIAGNOSIS — E860 Dehydration: Secondary | ICD-10-CM | POA: Diagnosis present

## 2022-08-10 DIAGNOSIS — E669 Obesity, unspecified: Secondary | ICD-10-CM | POA: Diagnosis present

## 2022-08-10 DIAGNOSIS — Z818 Family history of other mental and behavioral disorders: Secondary | ICD-10-CM

## 2022-08-10 DIAGNOSIS — E0590 Thyrotoxicosis, unspecified without thyrotoxic crisis or storm: Secondary | ICD-10-CM | POA: Diagnosis present

## 2022-08-10 DIAGNOSIS — F250 Schizoaffective disorder, bipolar type: Secondary | ICD-10-CM | POA: Diagnosis present

## 2022-08-10 DIAGNOSIS — Z79899 Other long term (current) drug therapy: Secondary | ICD-10-CM | POA: Diagnosis not present

## 2022-08-10 DIAGNOSIS — F411 Generalized anxiety disorder: Secondary | ICD-10-CM | POA: Diagnosis present

## 2022-08-10 DIAGNOSIS — I10 Essential (primary) hypertension: Secondary | ICD-10-CM | POA: Diagnosis present

## 2022-08-10 DIAGNOSIS — Z83438 Family history of other disorder of lipoprotein metabolism and other lipidemia: Secondary | ICD-10-CM

## 2022-08-10 DIAGNOSIS — E039 Hypothyroidism, unspecified: Secondary | ICD-10-CM | POA: Diagnosis present

## 2022-08-10 DIAGNOSIS — Z803 Family history of malignant neoplasm of breast: Secondary | ICD-10-CM | POA: Diagnosis not present

## 2022-08-10 DIAGNOSIS — Z91199 Patient's noncompliance with other medical treatment and regimen due to unspecified reason: Secondary | ICD-10-CM

## 2022-08-10 DIAGNOSIS — Z6831 Body mass index (BMI) 31.0-31.9, adult: Secondary | ICD-10-CM | POA: Diagnosis not present

## 2022-08-10 DIAGNOSIS — Z20822 Contact with and (suspected) exposure to covid-19: Secondary | ICD-10-CM | POA: Diagnosis present

## 2022-08-10 DIAGNOSIS — R410 Disorientation, unspecified: Secondary | ICD-10-CM | POA: Diagnosis present

## 2022-08-10 LAB — CBC WITH DIFFERENTIAL/PLATELET
Abs Immature Granulocytes: 0.01 10*3/uL (ref 0.00–0.07)
Basophils Absolute: 0.1 10*3/uL (ref 0.0–0.1)
Basophils Relative: 1 %
Eosinophils Absolute: 0 10*3/uL (ref 0.0–0.5)
Eosinophils Relative: 0 %
HCT: 52.4 % — ABNORMAL HIGH (ref 36.0–46.0)
Hemoglobin: 16.4 g/dL — ABNORMAL HIGH (ref 12.0–15.0)
Immature Granulocytes: 0 %
Lymphocytes Relative: 27 %
Lymphs Abs: 2.1 10*3/uL (ref 0.7–4.0)
MCH: 27.8 pg (ref 26.0–34.0)
MCHC: 31.3 g/dL (ref 30.0–36.0)
MCV: 89 fL (ref 80.0–100.0)
Monocytes Absolute: 0.9 10*3/uL (ref 0.1–1.0)
Monocytes Relative: 11 %
Neutro Abs: 4.7 10*3/uL (ref 1.7–7.7)
Neutrophils Relative %: 61 %
Platelets: 275 10*3/uL (ref 150–400)
RBC: 5.89 MIL/uL — ABNORMAL HIGH (ref 3.87–5.11)
RDW: 14 % (ref 11.5–15.5)
WBC: 7.8 10*3/uL (ref 4.0–10.5)
nRBC: 0 % (ref 0.0–0.2)

## 2022-08-10 LAB — ACETAMINOPHEN LEVEL: Acetaminophen (Tylenol), Serum: <10 ug/mL — ABNORMAL LOW (ref 10–30)

## 2022-08-10 LAB — SALICYLATE LEVEL: Salicylate Lvl: <7 mg/dL — ABNORMAL LOW (ref 7.0–30.0)

## 2022-08-10 LAB — COMPREHENSIVE METABOLIC PANEL
ALT: 16 U/L (ref 0–44)
AST: 21 U/L (ref 15–41)
Albumin: 4.5 g/dL (ref 3.5–5.0)
Alkaline Phosphatase: 92 U/L (ref 38–126)
Anion gap: 13 (ref 5–15)
BUN: 44 mg/dL — ABNORMAL HIGH (ref 6–20)
CO2: 23 mmol/L (ref 22–32)
Calcium: 11.4 mg/dL — ABNORMAL HIGH (ref 8.9–10.3)
Chloride: 123 mmol/L — ABNORMAL HIGH (ref 98–111)
Creatinine, Ser: 2.02 mg/dL — ABNORMAL HIGH (ref 0.44–1.00)
GFR, Estimated: 28 mL/min — ABNORMAL LOW (ref >60)
Glucose, Bld: 119 mg/dL — ABNORMAL HIGH (ref 70–99)
Potassium: 4 mmol/L (ref 3.5–5.1)
Sodium: 159 mmol/L — ABNORMAL HIGH (ref 135–145)
Total Bilirubin: 1.3 mg/dL — ABNORMAL HIGH (ref 0.3–1.2)
Total Protein: 8.1 g/dL (ref 6.5–8.1)

## 2022-08-10 LAB — I-STAT BETA HCG BLOOD, ED (MC, WL, AP ONLY): I-stat hCG, quantitative: 5.9 m[IU]/mL — ABNORMAL HIGH (ref <5)

## 2022-08-10 LAB — RESP PANEL BY RT-PCR (FLU A&B, COVID) ARPGX2
Influenza A by PCR: NEGATIVE
Influenza B by PCR: NEGATIVE
SARS Coronavirus 2 by RT PCR: NEGATIVE

## 2022-08-10 LAB — ETHANOL: Alcohol, Ethyl (B): <10 mg/dL (ref <10)

## 2022-08-10 LAB — TSH: TSH: <0.01 u[IU]/mL — ABNORMAL LOW (ref 0.350–4.500)

## 2022-08-10 MED ORDER — ONDANSETRON HCL 4 MG PO TABS: 4.0000 mg | ORAL_TABLET | Freq: Four times a day (QID) | ORAL | Status: DC | PRN

## 2022-08-10 MED ORDER — PROPRANOLOL HCL 10 MG PO TABS
10.0000 mg | ORAL_TABLET | Freq: Two times a day (BID) | ORAL | Status: DC
  Administered 2022-08-11 – 2022-08-17 (×13): 10 mg via ORAL
  Filled 2022-08-10 (×14): qty 1

## 2022-08-10 MED ORDER — ACETAMINOPHEN 650 MG RE SUPP: 650.0000 mg | Freq: Four times a day (QID) | RECTAL | Status: DC | PRN

## 2022-08-10 MED ORDER — METHIMAZOLE 10 MG PO TABS
10.0000 mg | ORAL_TABLET | Freq: Three times a day (TID) | ORAL | Status: DC
  Administered 2022-08-11 – 2022-08-15 (×16): 10 mg via ORAL
  Filled 2022-08-10 (×19): qty 1

## 2022-08-10 MED ORDER — ACETAMINOPHEN 325 MG PO TABS
650.0000 mg | ORAL_TABLET | Freq: Four times a day (QID) | ORAL | Status: DC | PRN
  Filled 2022-08-10: qty 2

## 2022-08-10 MED ORDER — SODIUM CHLORIDE 0.9 % IV BOLUS: 1000.0000 mL | Freq: Once | INTRAVENOUS | Status: DC

## 2022-08-10 MED ORDER — LORAZEPAM 2 MG/ML IJ SOLN
1.0000 mg | INTRAMUSCULAR | Status: DC | PRN
  Administered 2022-08-14: 1 mg via INTRAVENOUS
  Filled 2022-08-10: qty 1

## 2022-08-10 MED ORDER — LORAZEPAM 1 MG PO TABS: 1.0000 mg | ORAL_TABLET | Freq: Once | ORAL | Status: DC

## 2022-08-10 MED ORDER — DEXTROSE 5 % IV SOLN: INTRAVENOUS | Status: DC

## 2022-08-10 MED ORDER — LACTATED RINGERS IV BOLUS: 1000.0000 mL | Freq: Once | INTRAVENOUS | Status: DC

## 2022-08-10 MED ORDER — ONDANSETRON HCL 4 MG/2ML IJ SOLN: 4.0000 mg | Freq: Four times a day (QID) | INTRAMUSCULAR | Status: DC | PRN

## 2022-08-10 MED ORDER — ENOXAPARIN SODIUM 30 MG/0.3ML IJ SOSY
30.0000 mg | PREFILLED_SYRINGE | INTRAMUSCULAR | Status: DC
  Administered 2022-08-11: 30 mg via SUBCUTANEOUS
  Filled 2022-08-10: qty 0.3

## 2022-08-10 MED ORDER — PROPRANOLOL HCL 1 MG/ML IV SOLN: 1.0000 mg | Freq: Once | INTRAVENOUS | Status: DC

## 2022-08-10 MED ORDER — LACTATED RINGERS IV BOLUS
1000.0000 mL | Freq: Once | INTRAVENOUS | Status: AC
  Administered 2022-08-10: 1000 mL via INTRAVENOUS

## 2022-08-10 MED ORDER — LORAZEPAM 2 MG/ML IJ SOLN
2.0000 mg | Freq: Once | INTRAMUSCULAR | Status: AC
  Administered 2022-08-10: 2 mg via INTRAMUSCULAR
  Filled 2022-08-10: qty 1

## 2022-08-10 MED ORDER — PROPRANOLOL HCL 1 MG/ML IV SOLN
1.0000 mg | Freq: Once | INTRAVENOUS | Status: AC
  Administered 2022-08-11: 1 mg via INTRAVENOUS
  Filled 2022-08-10: qty 1

## 2022-08-10 MED ORDER — SODIUM CHLORIDE 0.9 % IV BOLUS
1000.0000 mL | Freq: Once | INTRAVENOUS | Status: AC
  Administered 2022-08-10: 1000 mL via INTRAVENOUS

## 2022-08-10 MED ORDER — LACTATED RINGERS IV SOLN: INTRAVENOUS | Status: DC

## 2022-08-10 NOTE — ED Notes (Signed)
Spoke with Dr. Alcario Drought in person regarding pt's ST rate 130s. Pt asymptomatic, pt very talkative has no complaints. Dr. Alcario Drought reports pt has not been taking her regular schedule meds outside hospital. Dr. Alcario Drought to order meds for HR, refer to [facility].

## 2022-08-10 NOTE — Assessment & Plan Note (Addendum)
Likely pre-renal / dehydration / ATN from not eating / drinking due to psych issues. Pt has history of same requiring admission in past, also with hypernatremia as well at that time.  Looks like this last occurred in March 2022.  During that admission: pt improved with IVF bolus initially followed by D5W for hypernatremia. 1. Strict intake and output 1. Free water deficit = 5.1L 2. 1L NS bolus in ED, also adding 1L LR bolus 3. D5W at 100 cc/hr thereafter. 4. BMP Q6H 1. maximum safe rate at which the serum sodium concentration should be lowered is 12 mEq/L per day 2. Switch D5W to LR or even NS if correcting too quickly please.

## 2022-08-10 NOTE — H&P (Addendum)
History and Physical    Patient: Paula Dickson KDT:[id number] DOB: [birth year] DOA: 08/10/2022 DOS: the patient was seen and examined on 08/10/2022 PCP: [name]  Patient coming from: Home  Chief Complaint:  Chief Complaint  Patient presents with   Medical Screening    HPI: Paula Dickson is a 56 y.o. female with medical history significant of BPD, schizophrenia, schizoaffective,  HTN.  In to ED with AMS and psychosis.  She is brought into the emergency department by her sister, Paula Dickson, via EMS due to concerns over her behavior.  Sister states that she has been confused this week and not taking her gastric medications.  States that she was recently released from [facility] on 07/28/2022 and since then has not been taking medication.  Also states that she has not been eating or drinking fluids.  States that she goes to see her sister daily as patient lives alone and appears to be getting worse.  She denies alcohol or drug use.  Pt not really able to contribute to history due to mental status.  Found to have sodium of 159 and AKI with creat of 2.0 up from a 1.1 baseline.  On review of chart: seems she has history of not eating or drinking when she is psychotic / off psych meds.  This dehydrates her and she ends up requiring admission to hospital for hypernatremia and AKI.  Most recently in March 2022 she was admitted for the same with Sodium 167 and Creat of 3.6 at that time.  Improved with IVF.   Review of Systems: unable to review all systems due to the inability of the patient to answer questions. Past Medical History:  Diagnosis Date   Bipolar affective disorder ([place])    Bipolar disorder ([place])    History of arthritis    History of chicken pox    History of depression    History of genital warts    history of heart murmur    History of high blood pressure    History of thyroid disease    History of UTI    Hypertension    Low TSH level 07/13/2017   Schizophrenia  ([place])    Past Surgical History:  Procedure Laterality Date   ABLATION ON ENDOMETRIOSIS     CYST REMOVAL NECK     around 11 years ago /benign   MULTIPLE TOOTH EXTRACTIONS     Social History:  reports that she has never smoked. She has never used smokeless tobacco. She reports that she does not currently use alcohol. She reports that she does not currently use drugs.  No Known Allergies  Family History  Problem Relation Age of Onset   Arthritis Father    Hyperlipidemia Father    High blood pressure Father    Diabetes Sister    Diabetes Mother    Diabetes Brother    Mental illness Brother    Alcohol abuse Paternal Uncle    Alcohol abuse Paternal Grandfather    Breast cancer Maternal Aunt    Breast cancer Paternal Aunt    High blood pressure Sister    Mental illness Other        runs in family    Prior to Admission medications   Medication Sig Start Date End Date Taking? Authorizing Provider  benztropine (COGENTIN) 0.5 MG tablet Take 0.5 mg by mouth 2 (two) times daily. Patient not taking: Reported on 04/10/2022 03/07/22   [provider]  haloperidol (HALDOL) 5 MG tablet Take 1  tablet (5 mg total) by mouth daily. Patient not taking: Reported on 04/10/2022 12/24/21   Freida Busman, MD  LORazepam (ATIVAN) 1 MG tablet Take 1 mg by mouth 3 (three) times daily. Patient not taking: Reported on 04/10/2022 01/04/22   [provider]  losartan (COZAAR) 25 MG tablet Take 25 mg by mouth daily. Patient not taking: Reported on 04/10/2022 01/05/22   [provider]  melatonin 3 MG TABS tablet Take 3 mg by mouth at bedtime. Patient not taking: Reported on 04/10/2022    [provider]  meloxicam (MOBIC) 15 MG tablet Take 15 mg by mouth daily. Patient not taking: Reported on 04/10/2022    [provider]  methimazole (TAPAZOLE) 10 MG tablet Take 10 mg by mouth 3 (three) times daily. Patient not taking: Reported on 04/10/2022 01/26/22   [provider]  OLANZapine (ZYPREXA) 10 MG tablet Take 10 mg by mouth at bedtime. Patient not taking: Reported on 04/10/2022 11/04/21   [provider]  pantoprazole (PROTONIX) 40 MG tablet Take 40 mg by mouth daily. Patient not taking: Reported on 04/10/2022 03/07/22   [provider]  propranolol (INDERAL) 10 MG tablet Take 10 mg by mouth 2 (two) times daily. Patient not taking: Reported on 04/10/2022 01/04/22   [provider]  traZODone (DESYREL) 50 MG tablet Take 50 mg by mouth at bedtime. Patient not taking: Reported on 04/10/2022 03/07/22   [provider]    Physical Exam: Vitals:   08/10/22 1520 08/10/22 1523 08/10/22 1811  BP: (!) 147/108  (!) 134/104  Pulse: (!) 125  74  Resp: 19  18  Temp: 98 F (36.7 C)  (!) 97.3 F (36.3 C)  TempSrc:   Oral  SpO2: 94%  99%  Weight:  75 kg   Height:  '5\' 1"'$  (1.549 m)    Constitutional: NAD, calm, comfortable Eyes: PERRL, lids and conjunctivae normal ENMT: Mucous membranes are dry. Posterior pharynx clear of any exudate or lesions.Normal dentition.  Neck: normal, supple, no masses, no thyromegaly Respiratory: clear to auscultation bilaterally, no wheezing, no crackles. Normal respiratory effort. No accessory muscle use.  Cardiovascular: Tachycardic Abdomen: no tenderness, no masses palpated. No hepatosplenomegaly. Bowel sounds positive.  Musculoskeletal: no clubbing / cyanosis. No joint deformity upper and lower extremities. Good ROM, no contractures. Normal muscle tone.  Skin: no rashes, lesions, ulcers. No induration Neurologic: CN 2-12 grossly intact. Sensation intact, DTR normal. Strength 5/5 in all 4.  Psychiatric: Confused, visual hallucinations, psychotic  Data Reviewed:       Latest Ref Rng & Units 08/10/2022    6:45 PM 07/12/2022    9:19 PM 04/07/2022    6:00 PM  CMP  Glucose 70 - 99 mg/dL 119  88  127   BUN 6 - 20 mg/dL 44  8  28   Creatinine 0.44 - 1.00 mg/dL 2.02  1.10  1.43   Sodium 135 - 145 mmol/L  159  147  139   Potassium 3.5 - 5.1 mmol/L 4.0  3.9  3.6   Chloride 98 - 111 mmol/L 123  109  106   CO2 22 - 32 mmol/L '23  26  25   '$ Calcium 8.9 - 10.3 mg/dL 11.4  10.1  10.1   Total Protein 6.5 - 8.1 g/dL 8.1  7.1  7.4   Total Bilirubin 0.3 - 1.2 mg/dL 1.3  1.4  1.2   Alkaline Phos 38 - 126 U/L 92  78  78  AST 15 - 41 U/L '21  17  17   '$ ALT 0 - 44 U/L '16  18  20     '$ Assessment and Plan: * AKI (acute kidney injury) ([place]) Likely pre-renal / dehydration / ATN from not eating / drinking due to psych issues. Pt has history of same requiring admission in past, also with hypernatremia as well at that time.  Looks like this last occurred in March 2022.  During that admission: pt improved with IVF bolus initially followed by D5W for hypernatremia. Strict intake and output Free water deficit = 5.1L 1L NS bolus in ED, also adding 1L LR bolus D5W at 100 cc/hr thereafter. BMP Q6H maximum safe rate at which the serum sodium concentration should be lowered is 12 mEq/L per day Switch D5W to LR or even NS if correcting too quickly please.  Hypernatremia See AKI above  Schizoaffective disorder, bipolar type [facility]) Psychiatry consult for psych meds.  Looks like Psych already seeing / seen patient and note pending. In case of emergency overnight: sounds like '2mg'$  ativan was successful in ED at sedating patient. Pt is IVCd currently.  Hyperthyroidism H/o hyperthyroidism, presumably not taking her tapazole nor propranolol either (in addition to any meds, food, or fluids for that matter recently). Checking TSH, fT4 May need to resume these meds depending on results, although TSH was actually on high side (HYPOthyroid) when last checked 8 months ago, so I dont want to just empirically restart these meds until labs result.  Sinus tachycardia Putting in for tele monitor given initial tachycardia to 130s in ED, though I have my doubts that we will be very successful in keeping patient on this (due to psych  status). I suspect the tachycardia is primarily dehydration in origin. IVF now going      Advance Care Planning:   Code Status: Full Code   Consults: Psych  Family Communication: No family in room  Severity of Illness: The appropriate patient status for this patient is INPATIENT. Inpatient status is judged to be reasonable and necessary in order to provide the required intensity of service to ensure the patient's safety. The patient's presenting symptoms, physical exam findings, and initial radiographic and laboratory data in the context of their chronic comorbidities is felt to place them at high risk for further clinical deterioration. Furthermore, it is not anticipated that the patient will be medically stable for discharge from the hospital within 2 midnights of admission.   * I certify that at the point of admission it is my clinical judgment that the patient will require inpatient hospital care spanning beyond 2 midnights from the point of admission due to high intensity of service, high risk for further deterioration and high frequency of surveillance required.*  Author: Etta Quill., DO 08/10/2022 8:40 PM  For on call review [email or web address].

## 2022-08-10 NOTE — Assessment & Plan Note (Addendum)
1. Putting in for tele monitor given initial tachycardia to 130s in ED, though I have my doubts that we will be very successful in keeping patient on this (due to psych status). 1. I suspect the tachycardia is primarily dehydration in origin. 2. IVF now going

## 2022-08-10 NOTE — ED Notes (Signed)
Per Dr. Alcario Drought advised to monitor HR (cardiac monitor) and have fluids infuse at this time. Pt currently denies SOB or CP.

## 2022-08-10 NOTE — ED Triage Notes (Signed)
Pt BIB EMS from home [facility] was called out to home for a welfare check. Pt lives alone hx bipolar and depression. Per ems pt sister stated pt has been off her medications for a while.

## 2022-08-10 NOTE — ED Notes (Signed)
This RN attempted x2 for another IV, unsuccessful, charge RN Maylon Cos requested for US guided IV and labs

## 2022-08-10 NOTE — ED Notes (Signed)
Patient arrived by ambulance. Dr. Truett Mainland petitioned and did first exam 08/10/22, expires on 08/17/22.

## 2022-08-10 NOTE — Consult Note (Signed)
[place] ED ASSESSMENT   Reason for Consult:  IVC Referring Physician:   Patient Identification: Paula Dickson MRN:  [id number] ED Chief Complaint: AKI (acute kidney injury) [facility])  Diagnosis:  Principal Problem:   AKI (acute kidney injury) ([place]) Active Problems:   Hyperthyroidism   Schizoaffective disorder, bipolar type ([place])   Hypernatremia   Sinus tachycardia   ED Assessment Time Calculation: Start Time: 1630 Stop Time: 1700 Total Time in Minutes (Assessment Completion): 30   Subjective:   Paula Dickson is a 56 y.o. female patient with a history of schizoaffective disorder-bipolar type who presents to [facility] due to agressive behavior.  Patient petitioned for IVC by EDP. Per IVC, " aggressive behavior, confusion and psychosis, hallucinating about seeing things on the floor, not taking psychiatric medications and threatening family, not eating or drinking at home and caring for personal hygiene"  HPI:   On evaluation, patient is alert.  She is disoriented to place time and situation.  She is disheveled.   Patient appears restless during the assessment and changes positions often.Patient is unable to recall why she was brought to the hospital.  When asked about her recent admission to [facility] for psychiatric treatment, she states "I remember, but I went home early.  I am not a [place] friend.  I like smoking."  Patient mostly replies with irrelevant comments.  Several times during the assessment she states "I like Jazz."  She states "he likes to cook."  She was unable to elaborate on who she was referring to.  She made several nonsensical statements.  When asked about AVH, patient states "no" and then make statements about Spider-Man and liking jazz.   Of note, sodium level 159, chloride 123, BUN 44, Creatinine 2.02, Calcium 11.4.  Patient to be medically admitted.  Past Psychiatric History:  Schizoaffective disorder, Bipolar type, Generalized anxiety disorder.  Multiple  inpatient hospitalizations. Inpatient at [facility] 08/23, [facility] 02/2022, [facility] 03/2022, [facility] 12/202, 09/2020.  Risk to Self or Others: Is the patient at risk to self? Yes Has the patient been a risk to self in the past 6 months? Yes Has the patient been a risk to self within the distant past? Yes Is the patient a risk to others? No Has the patient been a risk to others in the past 6 months? No Has the patient been a risk to others within the distant past? No  [place] Scale:  [place] ED from 08/10/2022 in [place] DEPT ED from 07/12/2022 in [place] [facility]PT ED from 05/12/2022 in [place] DEPT  C-SSRS RISK CATEGORY No Risk No Risk Error: Q3, 4, or 5 should not be populated when Q2 is No       AIMS:  , , ,  ,   ASAM:    Substance Abuse:     Past Medical History:  Past Medical History:  Diagnosis Date   Bipolar affective disorder ([place])    Bipolar disorder ([place])    History of arthritis    History of chicken pox    History of depression    History of genital warts    history of heart murmur    History of high blood pressure    History of thyroid disease    History of UTI    Hypertension    Low TSH level 07/13/2017   Schizophrenia ([place])     Past Surgical History:  Procedure Laterality Date   ABLATION  ON ENDOMETRIOSIS     CYST REMOVAL NECK     around 11 years ago /benign   MULTIPLE TOOTH EXTRACTIONS     Family History:  Family History  Problem Relation Age of Onset   Arthritis Father    Hyperlipidemia Father    High blood pressure Father    Diabetes Sister    Diabetes Mother    Diabetes Brother    Mental illness Brother    Alcohol abuse Paternal Uncle    Alcohol abuse Paternal Grandfather    Breast cancer Maternal Aunt    Breast cancer Paternal Aunt    High blood pressure Sister    Mental illness Other        runs in family   Family  Psychiatric  History: unknown Social History:  Social History   Substance and Sexual Activity  Alcohol Use Not Currently     Social History   Substance and Sexual Activity  Drug Use Not Currently    Social History   Socioeconomic History   Marital status: Single    Spouse name: Not on file   Number of children: Not on file   Years of education: Not on file   Highest education level: Patient refused  Occupational History   Not on file  Tobacco Use   Smoking status: Never   Smokeless tobacco: Never  Vaping Use   Vaping Use: Never used  Substance and Sexual Activity   Alcohol use: Not Currently   Drug use: Not Currently   Sexual activity: Not Currently  Other Topics Concern   Not on file  Social History Narrative   ** Merged History Encounter **       Social Determinants of Health   Financial Resource Strain: Not on file  Food Insecurity: Not on file  Transportation Needs: Not on file  Physical Activity: Not on file  Stress: Not on file  Social Connections: Not on file   Additional Social History:    Allergies:  No Known Allergies  Labs:  Results for orders placed or performed during the hospital encounter of 08/10/22 (from the past 48 hour(s))  Resp Panel by RT-PCR (Flu A&B, Covid) Anterior Nasal Swab     Status: None   Collection Time: 08/10/22  4:21 PM   Specimen: Anterior Nasal Swab  Result Value Ref Range   SARS Coronavirus 2 by RT PCR NEGATIVE NEGATIVE    Comment: (NOTE) SARS-CoV-2 target nucleic acids are NOT DETECTED.  The SARS-CoV-2 RNA is generally detectable in upper respiratory specimens during the acute phase of infection. The lowest concentration of SARS-CoV-2 viral copies this assay can detect is 138 copies/mL. A negative result does not preclude SARS-Cov-2 infection and should not be used as the sole basis for treatment or other patient management decisions. A negative result may occur with  improper specimen collection/handling,  submission of specimen other than nasopharyngeal swab, presence of viral mutation(s) within the areas targeted by this assay, and inadequate number of viral copies(<138 copies/mL). A negative result must be combined with clinical observations, patient history, and epidemiological information. The expected result is Negative.  Fact Sheet for Patients:  EntrepreneurPulse.com.au  Fact Sheet for Healthcare Providers:  IncredibleEmployment.be  This test is no t yet approved or cleared by the [place] FDA and  has been authorized for detection and/or diagnosis of SARS-CoV-2 by FDA under an Emergency Use Authorization (EUA). This EUA will remain  in effect (meaning this test can be used) for the duration of the  COVID-19 declaration under Section 564(b)(1) of the Act, 21 U.S.C.section 360bbb-3(b)(1), unless the authorization is terminated  or revoked sooner.       Influenza A by PCR NEGATIVE NEGATIVE   Influenza B by PCR NEGATIVE NEGATIVE    Comment: (NOTE) The Xpert Xpress SARS-CoV-2/FLU/RSV plus assay is intended as an aid in the diagnosis of influenza from Nasopharyngeal swab specimens and should not be used as a sole basis for treatment. Nasal washings and aspirates are unacceptable for Xpert Xpress SARS-CoV-2/FLU/RSV testing.  Fact Sheet for Patients: EntrepreneurPulse.com.au  Fact Sheet for Healthcare Providers: IncredibleEmployment.be  This test is not yet approved or cleared by the [place] FDA and has been authorized for detection and/or diagnosis of SARS-CoV-2 by FDA under an Emergency Use Authorization (EUA). This EUA will remain in effect (meaning this test can be used) for the duration of the COVID-19 declaration under Section 564(b)(1) of the Act, 21 U.S.C. section 360bbb-3(b)(1), unless the authorization is terminated or revoked.  Performed at [facility], [place]  [address] [place], [place] [address]   Comprehensive metabolic panel     Status: Abnormal   Collection Time: 08/10/22  6:45 PM  Result Value Ref Range   Sodium 159 (H) 135 - 145 mmol/L   Potassium 4.0 3.5 - 5.1 mmol/L   Chloride 123 (H) 98 - 111 mmol/L   CO2 23 22 - 32 mmol/L   Glucose, Bld 119 (H) 70 - 99 mg/dL    Comment: Glucose reference range applies only to samples taken after fasting for at least 8 hours.   BUN 44 (H) 6 - 20 mg/dL   Creatinine, Ser 2.02 (H) 0.44 - 1.00 mg/dL   Calcium 11.4 (H) 8.9 - 10.3 mg/dL   Total Protein 8.1 6.5 - 8.1 g/dL   Albumin 4.5 3.5 - 5.0 g/dL   AST 21 15 - 41 U/L   ALT 16 0 - 44 U/L   Alkaline Phosphatase 92 38 - 126 U/L   Total Bilirubin 1.3 (H) 0.3 - 1.2 mg/dL   GFR, Estimated 28 (L) >60 mL/min    Comment: (NOTE) Calculated using the CKD-EPI Creatinine Equation (2021)    Anion gap 13 5 - 15    Comment: Performed at [facility], [place] [address] [place], [place] [address]  Ethanol     Status: None   Collection Time: 08/10/22  6:45 PM  Result Value Ref Range   Alcohol, Ethyl (B) <10 <10 mg/dL    Comment: (NOTE) Lowest detectable limit for serum alcohol is 10 mg/dL.  For medical purposes only. Performed at [facility], [place] [address], [place], [place] [address]   CBC with Diff     Status: Abnormal   Collection Time: 08/10/22  6:45 PM  Result Value Ref Range   WBC 7.8 4.0 - 10.5 K/uL   RBC 5.89 (H) 3.87 - 5.11 MIL/uL   Hemoglobin 16.4 (H) 12.0 - 15.0 g/dL   HCT 52.4 (H) 36.0 - 46.0 %   MCV 89.0 80.0 - 100.0 fL   MCH 27.8 26.0 - 34.0 pg   MCHC 31.3 30.0 - 36.0 g/dL   RDW 14.0 11.5 - 15.5 %   Platelets 275 150 - 400 K/uL   nRBC 0.0 0.0 - 0.2 %   Neutrophils Relative % 61 %   Neutro Abs 4.7 1.7 - 7.7 K/uL   Lymphocytes Relative 27 %   Lymphs Abs 2.1 0.7 - 4.0 K/uL   Monocytes Relative 11 %  Monocytes Absolute 0.9 0.1 - 1.0 K/uL   Eosinophils Relative 0 %   Eosinophils Absolute 0.0 0.0 -  0.5 K/uL   Basophils Relative 1 %   Basophils Absolute 0.1 0.0 - 0.1 K/uL   Immature Granulocytes 0 %   Abs Immature Granulocytes 0.01 0.00 - 0.07 K/uL    Comment: Performed at [facility], [place] [address] [place], [place] [address]  Acetaminophen level     Status: Abnormal   Collection Time: 08/10/22  6:45 PM  Result Value Ref Range   Acetaminophen (Tylenol), Serum <10 (L) 10 - 30 ug/mL    Comment: (NOTE) Therapeutic concentrations vary significantly. A range of 10-30 ug/mL  may be an effective concentration for many patients. However, some  are best treated at concentrations outside of this range. Acetaminophen concentrations >150 ug/mL at 4 hours after ingestion  and >50 ug/mL at 12 hours after ingestion are often associated with  toxic reactions.  Performed at [facility], [place] [address], [place], [place] [address]   Salicylate level     Status: Abnormal   Collection Time: 08/10/22  6:45 PM  Result Value Ref Range   Salicylate Lvl <2.4 (L) 7.0 - 30.0 mg/dL    Comment: Performed at [facility], [place] [address] [place], [place] [address]  I-Stat beta hCG blood, ED     Status: Abnormal   Collection Time: 08/10/22  6:56 PM  Result Value Ref Range   I-stat hCG, quantitative 5.9 (H) <5 mIU/mL   Comment 3            Comment:   GEST. AGE      CONC.  (mIU/mL)   <=1 WEEK        5 - 50     2 WEEKS       50 - 500     3 WEEKS       100 - 10,000     4 WEEKS     1,000 - 30,000        FEMALE AND NON-PREGNANT FEMALE:     LESS THAN 5 mIU/mL     Current Facility-Administered Medications  Medication Dose Route Frequency Provider Last Rate Last Admin   acetaminophen (TYLENOL) tablet 650 mg  650 mg Oral Q6H PRN Etta Quill, DO       Or   acetaminophen (TYLENOL) suppository 650 mg  650 mg Rectal Q6H PRN Etta Quill, DO       dextrose 5 % solution   Intravenous Continuous Etta Quill, DO 100 mL/hr at 08/10/22 2205 New  Bag at 08/10/22 2205   [START ON 08/11/2022] enoxaparin (LOVENOX) injection 30 mg  30 mg Subcutaneous Q24H Jennette Kettle M, DO       LORazepam (ATIVAN) injection 1-2 mg  1-2 mg Intravenous Q4H PRN Etta Quill, DO       ondansetron [facility]) tablet 4 mg  4 mg Oral Q6H PRN Etta Quill, DO       Or   ondansetron [facility]) injection 4 mg  4 mg Intravenous Q6H PRN Etta Quill, DO       Current Outpatient Medications  Medication Sig Dispense Refill   benztropine (COGENTIN) 0.5 MG tablet Take 0.5 mg by mouth 2 (two) times daily.     haloperidol (HALDOL) 5 MG tablet Take 1 tablet (5 mg total) by mouth daily. 30 tablet 0   LORazepam (ATIVAN) 1 MG tablet Take 1 mg by  mouth 3 (three) times daily.     losartan (COZAAR) 25 MG tablet Take 25 mg by mouth daily.     melatonin 3 MG TABS tablet Take 3 mg by mouth at bedtime.     meloxicam (MOBIC) 15 MG tablet Take 15 mg by mouth daily.     methimazole (TAPAZOLE) 10 MG tablet Take 10 mg by mouth 3 (three) times daily.     OLANZapine (ZYPREXA) 10 MG tablet Take 10 mg by mouth at bedtime.     pantoprazole (PROTONIX) 40 MG tablet Take 40 mg by mouth daily.     propranolol (INDERAL) 10 MG tablet Take 10 mg by mouth 2 (two) times daily.     traZODone (DESYREL) 50 MG tablet Take 50 mg by mouth at bedtime.      Psychiatric Specialty Exam: Presentation  General Appearance: Disheveled  Eye Contact:Fair  Speech:Normal Rate  Speech Volume:Normal  Handedness:Right   Mood and Affect  Mood:Dysphoric  Affect:Blunt   Thought Process  Thought Processes:Disorganized; Irrevelant  Descriptions of Associations:Loose  Orientation:None  Thought Content:Illogical  History of Schizophrenia/Schizoaffective disorder:Yes  Duration of Psychotic Symptoms:Greater than six months  Hallucinations:Hallucinations: None  Ideas of Reference:None  Suicidal Thoughts:Suicidal Thoughts: No  Homicidal Thoughts:Homicidal Thoughts: No   Sensorium   Memory:Immediate Poor; Recent Poor; Remote Poor  Judgment:Impaired  Insight:Poor   Executive Functions  Concentration:Poor  Attention Span:Poor  Recall:Poor  Fund of Knowledge:Poor  Language:Poor   Psychomotor Activity  Psychomotor Activity:Psychomotor Activity: Restlessness   Assets  Assets:Financial Resources/Insurance    Sleep  Sleep:No data recorded  Physical Exam: Physical Exam Constitutional:      General: She is not in acute distress.    Appearance: She is not ill-appearing, toxic-appearing or diaphoretic.  Eyes:     General:        Right eye: No discharge.        Left eye: No discharge.  Cardiovascular:     Rate and Rhythm: Tachycardia present.  Pulmonary:     Effort: Pulmonary effort is normal. No respiratory distress.  Musculoskeletal:        General: Normal range of motion.  Neurological:     Mental Status: She is alert.    Review of Systems  Unable to perform ROS: Psychiatric disorder   Blood pressure (!) 118/107, pulse 74, temperature (!) 97.3 F (36.3 C), temperature source Oral, resp. rate 17, height '5\' 1"'$  (1.549 m), weight 75 kg, SpO2 99 %. Body mass index is 31.24 kg/m.  Medical Decision Making: Unable to verify current medications at this time. Considering patient's hypernatremia, AKI, and slightly prolonged Qtc (459) recommend not restarting antipsychotics tonight.   Continue ativan 1-2 mg IV every 4 hours prn for agitation/anxiety as previously ordered  Problem 1: Schizoaffective disorder-bipolar type   Disposition:  Patient will need psychiatry consult.   Rozetta Nunnery, NP 08/10/2022 10:09 PM

## 2022-08-10 NOTE — Assessment & Plan Note (Addendum)
Psychiatry consult for psych meds.  Looks like Psych already seeing / seen patient and note pending. In case of emergency overnight: sounds like '2mg'$  ativan was successful in ED at sedating patient. Pt is IVCd currently.

## 2022-08-10 NOTE — [place] Notes (Addendum)
HR persistently 130 despite sedation with ativan + 2L IVF bolus.  Monitor reviewed, all rhythms wether 130s or 140s (before IVF bolus) are sinus tach (in contrast to March 2022 admit when she was in A.Fib initially).  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  SBP 140s currently.  Will order '1mg'$  propranolol IV x1 now. Will try to restart PO tapazole now Will order restarting of her low dose ('10mg'$  PO BID propranolol) for tomorrow.  May require additional BBs (more IV propranolol or higher PO dose of propranolol).  But I'm not thinking that shes in thyroid storm at this point.  Note that TSH was also < 0.010 during her March 2022 admit.  Was treated with Propranolol '40mg'$  PO BID and tapazole '10mg'$  PO BID att that time.

## 2022-08-10 NOTE — ED Provider Notes (Signed)
[facility] DEPT Provider Note   CSN: [id number] Arrival date & time: 08/10/22  1501     History  Chief Complaint  Patient presents with   Medical Screening     Paula Dickson is a 56 y.o. female.  With past medical history of bipolar disorder, schizophrenia, hypertension who presents to the emergency department with altered mental status and psychosis.  She is brought into the emergency department by her sister, [name], via EMS due to concerns over her behavior.  Sister states that she has been confused this week and not taking her gastric medications.  States that she was recently released from [facility] on 07/28/2022 and since then has not been taking medication.  Also states that she has not been eating or drinking fluids.  States that she goes to see her sister daily as patient lives alone and appears to be getting worse.  She denies alcohol or drug use.  I have attempted to interview the patient.  She speaks tangentially.  States "I like strawberries and water and melons."  She then talks about something on the floor that she sees.  She does intermittently get agitated and yells at her sister.  She denies SI/HI/AVH but appears to be possibly having visual hallucinations.  She also laughs at one point inappropriately.  She then has rapid speech which she continues to repeat liking certain foods.  She is disoriented when attempting ask her questions.  HPI     Home Medications Prior to Admission medications   Medication Sig Start Date End Date Taking? Authorizing Provider  benztropine (COGENTIN) 0.5 MG tablet Take 0.5 mg by mouth 2 (two) times daily. Patient not taking: Reported on 04/10/2022 03/07/22   [provider]  haloperidol (HALDOL) 5 MG tablet Take 1 tablet (5 mg total) by mouth daily. Patient not taking: Reported on 04/10/2022 12/24/21   Freida Busman, MD  LORazepam (ATIVAN) 1 MG tablet Take 1 mg by mouth 3 (three) times  daily. Patient not taking: Reported on 04/10/2022 01/04/22   [provider]  losartan (COZAAR) 25 MG tablet Take 25 mg by mouth daily. Patient not taking: Reported on 04/10/2022 01/05/22   [provider]  melatonin 3 MG TABS tablet Take 3 mg by mouth at bedtime. Patient not taking: Reported on 04/10/2022    [provider]  meloxicam (MOBIC) 15 MG tablet Take 15 mg by mouth daily. Patient not taking: Reported on 04/10/2022    [provider]  methimazole (TAPAZOLE) 10 MG tablet Take 10 mg by mouth 3 (three) times daily. Patient not taking: Reported on 04/10/2022 01/26/22   [provider]  OLANZapine (ZYPREXA) 10 MG tablet Take 10 mg by mouth at bedtime. Patient not taking: Reported on 04/10/2022 11/04/21   [provider]  pantoprazole (PROTONIX) 40 MG tablet Take 40 mg by mouth daily. Patient not taking: Reported on 04/10/2022 03/07/22   [provider]  propranolol (INDERAL) 10 MG tablet Take 10 mg by mouth 2 (two) times daily. Patient not taking: Reported on 04/10/2022 01/04/22   [provider]  traZODone (DESYREL) 50 MG tablet Take 50 mg by mouth at bedtime. Patient not taking: Reported on 04/10/2022 03/07/22   [provider]      Allergies    Patient has no known allergies.    Review of Systems   Review of Systems  Unable to perform ROS: Psychiatric disorder    Physical Exam Updated Vital Signs BP (!) 134/104 (  BP Location: Left Arm)   Pulse 74   Temp (!) 97.3 F (36.3 C) (Oral)   Resp 18   Ht '5\' 1"'$  (1.549 m)   Wt 75 kg   SpO2 99%   BMI 31.24 kg/m  Physical Exam Vitals and nursing note reviewed.  Constitutional:      General: She is not in acute distress.    Appearance: She is not toxic-appearing.  HENT:     Head: Normocephalic and atraumatic.     Mouth/Throat:     Mouth: Mucous membranes are dry.     Pharynx: Oropharynx is clear.  Eyes:     General: No scleral icterus.    Extraocular Movements:  Extraocular movements intact.  Cardiovascular:     Rate and Rhythm: Regular rhythm. Tachycardia present.     Pulses: Normal pulses.     Heart sounds: No murmur heard. Pulmonary:     Effort: Pulmonary effort is normal. No respiratory distress.     Breath sounds: Normal breath sounds.  Abdominal:     General: Bowel sounds are normal. There is no distension.     Palpations: Abdomen is soft.     Tenderness: There is no abdominal tenderness.  Musculoskeletal:        General: Normal range of motion.     Cervical back: Neck supple.  Skin:    General: Skin is warm and dry.     Capillary Refill: Capillary refill takes less than 2 seconds.     Findings: No bruising.  Neurological:     General: No focal deficit present.     Mental Status: She is alert. She is confused.     Cranial Nerves: Cranial nerves 2-12 are intact.     Motor: Motor function is intact.  Psychiatric:        Attention and Perception: She is inattentive. She perceives visual hallucinations. She does not perceive auditory hallucinations.        Mood and Affect: Affect is labile.        Speech: Speech is rapid and pressured and tangential.        Behavior: Behavior is agitated.        Thought Content: Thought content does not include homicidal or suicidal ideation.        Cognition and Memory: Cognition is impaired. She exhibits impaired recent memory.        Judgment: Judgment is impulsive and inappropriate.    ED Results / Procedures / Treatments   Labs (all labs ordered are listed, but only abnormal results are displayed) Labs Reviewed  COMPREHENSIVE METABOLIC PANEL - Abnormal; Notable for the following components:      Result Value   Sodium 159 (*)    Chloride 123 (*)    Glucose, Bld 119 (*)    BUN 44 (*)    Creatinine, Ser 2.02 (*)    Calcium 11.4 (*)    Total Bilirubin 1.3 (*)    GFR, Estimated 28 (*)    All other components within normal limits  CBC WITH DIFFERENTIAL/PLATELET - Abnormal; Notable for the  following components:   RBC 5.89 (*)    Hemoglobin 16.4 (*)    HCT 52.4 (*)    All other components within normal limits  ACETAMINOPHEN LEVEL - Abnormal; Notable for the following components:   Acetaminophen (Tylenol), Serum <10 (*)    All other components within normal limits  SALICYLATE LEVEL - Abnormal; Notable for the following components:   Salicylate Lvl <0.2 (*)  All other components within normal limits  I-STAT BETA HCG BLOOD, ED ([facility], [facility], AP ONLY) - Abnormal; Notable for the following components:   I-stat hCG, quantitative 5.9 (*)    All other components within normal limits  RESP PANEL BY RT-PCR (FLU A&B, COVID) ARPGX2  ETHANOL  RAPID URINE DRUG SCREEN, HOSP PERFORMED  URINALYSIS, ROUTINE W REFLEX MICROSCOPIC  OSMOLALITY  SODIUM, URINE, RANDOM  BASIC METABOLIC PANEL  BASIC METABOLIC PANEL   EKG None  Radiology No results found.  Procedures .Critical Care  Performed by: Mickie Hillier, PA-C Authorized by: Mickie Hillier, PA-C   Critical care provider statement:    Critical care time (minutes):  40   Critical care time was exclusive of:  Separately billable procedures and treating other patients   Critical care was necessary to treat or prevent imminent or life-threatening deterioration of the following conditions:  Metabolic crisis   Critical care was time spent personally by me on the following activities:  Development of treatment plan with patient or surrogate, discussions with consultants, evaluation of patient's response to treatment, discussions with primary provider, examination of patient, interpretation of cardiac output measurements, obtaining history from patient or surrogate, review of old charts, re-evaluation of patient's condition, pulse oximetry, ordering and review of radiographic studies, ordering and review of laboratory studies and ordering and performing treatments and interventions   I assumed direction of critical care for this patient from  another provider in my specialty: no     Care discussed with: admitting provider       Medications Ordered in ED Medications  sodium chloride 0.9 % bolus 1,000 mL (has no administration in time range)  lactated ringers bolus 1,000 mL (has no administration in time range)  dextrose 5 % solution (has no administration in time range)  LORazepam (ATIVAN) injection 2 mg (2 mg Intramuscular Given 08/10/22 1700)    ED Course/ Medical Decision Making/ A&P                           Medical Decision Making Amount and/or Complexity of Data Reviewed Labs: ordered.  Risk Prescription drug management. Decision regarding hospitalization.  This patient presents to the ED with chief complaint(s) of psychiatric disorder with pertinent past medical history of bipolar, schizophrenia, hypertension which further complicates the presenting complaint. The complaint involves an extensive differential diagnosis and treatment options and also carries with it a high risk of complications and morbidity.    The differential diagnosis includes psychosis, electrolyte abnormalities, ingestion/intoxication   Additional history obtained: Additional history obtained from family Records reviewed Care Everywhere/External Records and Primary Care Documents  ED Course: Lab Tests: I Ordered, and personally interpreted labs.  The pertinent results include:   Sodium of 159*, chloride of 123, creatinine of 2 Tylenol, salicylates, alcohol negative Serum osmolality, urine sodium, UDS and UA are pending. Imaging Studies: not indicated  Cardiac Monitoring: The patient was maintained on a cardiac monitor.  I personally viewed and interpreted the cardiac monitor which showed an underlying rhythm of:  sinus rhythm Medicines ordered and prescription drug management: I ordered the following medications Ativan for agitation  I considered this additional medications: Geodon Reevaluation of the patient after these medicines  showed that the patient    improved  Reassessment and review (also see workup area): 56 year old female who presents to the emergency department with agitation, not taking her medications, not eating or drinking.  On physical exam she does appear dry.  She is tachycardic to the 130s.  She denies having AVH however she talks incoherently and tangentially.  She intermittently has outbursts of agitation and anger.  She denies SI or HI. Will obtain med clearance orders and EKG.  I have ordered 2 mg of IM Ativan given her agitation.  She will likely need to be IVC'd for psychiatric stabilization.  On reassessment after Ativan she is calm and cooperative in bed.  She has allowed [place] to obtain blood work at this time which is now pending.  She is eating and drinking.  I have filled out IVC petition and first exam to be sent to magistrate.  Labs returned with critical hypernatremia to 159.  She also has a chloride of 123, creatinine of 2.  Likely severe dehydration as she is not eating or drinking at home.  Appears that she has done this a year ago with similar labs but has not had critical hypernatremia in over a year.  I reviewed her medications. 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 She has not had any excessive vomiting or diarrhea. She is not hyperthermic. Will add on serum osmolality and urine sodium to better evaluate while admitted. Still believe this likely to be dehydration but will consult nephrology for opinion and recommendations given AKI. We will give a liter of normal saline.  Given her critical labs, will need to be admitted to medicine.  I have consulted and spoke with Jennette Kettle, hospitalist, who agrees to admit the patient.  Still appreciate psychiatry recommendations for medication regimen.  Social Determinants of Health: Psychiatric illness Patient's  psychiatric disorder   increases the complexity of managing their presentation  Final Clinical  Impression(s) / ED Diagnoses Final diagnoses:  Schizoaffective disorder, bipolar type ([place])  Hypernatremia    Rx / DC Orders ED Discharge Orders     None         Bing Matter 08/10/22 2033    Cristie Hem, MD 08/11/22 1437

## 2022-08-10 NOTE — Assessment & Plan Note (Addendum)
H/o hyperthyroidism, presumably not taking her tapazole nor propranolol either (in addition to any meds, food, or fluids for that matter recently). 1. Checking TSH, fT4 2. May need to resume these meds depending on results, although TSH was actually on high side (HYPOthyroid) when last checked 8 months ago, so I dont want to just empirically restart these meds until labs result.

## 2022-08-10 NOTE — Assessment & Plan Note (Signed)
See AKI above ?

## 2022-08-10 NOTE — ED Notes (Signed)
Pt was changed out in the room. Belongings have been placed behind nurses station of 23-25.

## 2022-08-11 DIAGNOSIS — E0590 Thyrotoxicosis, unspecified without thyrotoxic crisis or storm: Secondary | ICD-10-CM | POA: Diagnosis not present

## 2022-08-11 DIAGNOSIS — N179 Acute kidney failure, unspecified: Secondary | ICD-10-CM | POA: Diagnosis not present

## 2022-08-11 DIAGNOSIS — F250 Schizoaffective disorder, bipolar type: Secondary | ICD-10-CM | POA: Diagnosis not present

## 2022-08-11 DIAGNOSIS — E870 Hyperosmolality and hypernatremia: Secondary | ICD-10-CM | POA: Diagnosis not present

## 2022-08-11 LAB — BASIC METABOLIC PANEL
Anion gap: 6 (ref 5–15)
Anion gap: 4 — ABNORMAL LOW (ref 5–15)
Anion gap: 4 — ABNORMAL LOW (ref 5–15)
Anion gap: 5 (ref 5–15)
BUN: 44 mg/dL — ABNORMAL HIGH (ref 6–20)
BUN: 42 mg/dL — ABNORMAL HIGH (ref 6–20)
BUN: 37 mg/dL — ABNORMAL HIGH (ref 6–20)
BUN: 31 mg/dL — ABNORMAL HIGH (ref 6–20)
CO2: 24 mmol/L (ref 22–32)
CO2: 24 mmol/L (ref 22–32)
CO2: 24 mmol/L (ref 22–32)
CO2: 23 mmol/L (ref 22–32)
Calcium: 9.6 mg/dL (ref 8.9–10.3)
Calcium: 9.8 mg/dL (ref 8.9–10.3)
Calcium: 9.4 mg/dL (ref 8.9–10.3)
Calcium: 9.2 mg/dL (ref 8.9–10.3)
Chloride: 121 mmol/L — ABNORMAL HIGH (ref 98–111)
Chloride: 122 mmol/L — ABNORMAL HIGH (ref 98–111)
Chloride: 118 mmol/L — ABNORMAL HIGH (ref 98–111)
Chloride: 119 mmol/L — ABNORMAL HIGH (ref 98–111)
Creatinine, Ser: 1.72 mg/dL — ABNORMAL HIGH (ref 0.44–1.00)
Creatinine, Ser: 1.65 mg/dL — ABNORMAL HIGH (ref 0.44–1.00)
Creatinine, Ser: 1.32 mg/dL — ABNORMAL HIGH (ref 0.44–1.00)
Creatinine, Ser: 1.11 mg/dL — ABNORMAL HIGH (ref 0.44–1.00)
GFR, Estimated: 34 mL/min — ABNORMAL LOW (ref >60)
GFR, Estimated: 36 mL/min — ABNORMAL LOW (ref >60)
GFR, Estimated: 47 mL/min — ABNORMAL LOW (ref >60)
GFR, Estimated: 58 mL/min — ABNORMAL LOW (ref >60)
Glucose, Bld: 147 mg/dL — ABNORMAL HIGH (ref 70–99)
Glucose, Bld: 118 mg/dL — ABNORMAL HIGH (ref 70–99)
Glucose, Bld: 141 mg/dL — ABNORMAL HIGH (ref 70–99)
Glucose, Bld: 94 mg/dL (ref 70–99)
Potassium: 3.5 mmol/L (ref 3.5–5.1)
Potassium: 3.5 mmol/L (ref 3.5–5.1)
Potassium: 3.6 mmol/L (ref 3.5–5.1)
Potassium: 3.3 mmol/L — ABNORMAL LOW (ref 3.5–5.1)
Sodium: 151 mmol/L — ABNORMAL HIGH (ref 135–145)
Sodium: 150 mmol/L — ABNORMAL HIGH (ref 135–145)
Sodium: 146 mmol/L — ABNORMAL HIGH (ref 135–145)
Sodium: 147 mmol/L — ABNORMAL HIGH (ref 135–145)

## 2022-08-11 LAB — RAPID URINE DRUG SCREEN, HOSP PERFORMED
Amphetamines: NOT DETECTED
Barbiturates: NOT DETECTED
Benzodiazepines: NOT DETECTED
Cocaine: NOT DETECTED
Opiates: NOT DETECTED
Tetrahydrocannabinol: NOT DETECTED

## 2022-08-11 LAB — URINALYSIS, ROUTINE W REFLEX MICROSCOPIC
Bilirubin Urine: NEGATIVE
Glucose, UA: NEGATIVE mg/dL
Hgb urine dipstick: NEGATIVE
Ketones, ur: NEGATIVE mg/dL
Leukocytes,Ua: NEGATIVE
Nitrite: NEGATIVE
Protein, ur: NEGATIVE mg/dL
Specific Gravity, Urine: 1.003 — ABNORMAL LOW (ref 1.005–1.030)
pH: 6 (ref 5.0–8.0)

## 2022-08-11 LAB — CBC
HCT: 39.4 % (ref 36.0–46.0)
Hemoglobin: 12.5 g/dL (ref 12.0–15.0)
MCH: 28.2 pg (ref 26.0–34.0)
MCHC: 31.7 g/dL (ref 30.0–36.0)
MCV: 88.7 fL (ref 80.0–100.0)
Platelets: 189 10*3/uL (ref 150–400)
RBC: 4.44 MIL/uL (ref 3.87–5.11)
RDW: 13.8 % (ref 11.5–15.5)
WBC: 9.8 10*3/uL (ref 4.0–10.5)
nRBC: 0 % (ref 0.0–0.2)

## 2022-08-11 LAB — SODIUM, URINE, RANDOM: Sodium, Ur: 29 mmol/L

## 2022-08-11 LAB — OSMOLALITY: Osmolality: 357 mOsm/kg (ref 275–295)

## 2022-08-11 LAB — HIV ANTIBODY (ROUTINE TESTING W REFLEX): HIV Screen 4th Generation wRfx: NONREACTIVE

## 2022-08-11 LAB — T4, FREE: Free T4: 1.87 ng/dL — ABNORMAL HIGH (ref 0.61–1.12)

## 2022-08-11 MED ORDER — SODIUM CHLORIDE 0.9 % IV SOLN: INTRAVENOUS | Status: DC

## 2022-08-11 MED ORDER — OLANZAPINE 5 MG PO TABS
5.0000 mg | ORAL_TABLET | Freq: Every day | ORAL | Status: DC
  Administered 2022-08-12 – 2022-08-16 (×5): 5 mg via ORAL
  Filled 2022-08-11 (×5): qty 1

## 2022-08-11 MED ORDER — BENZTROPINE MESYLATE 0.5 MG PO TABS
0.5000 mg | ORAL_TABLET | Freq: Two times a day (BID) | ORAL | Status: DC
  Administered 2022-08-11 – 2022-08-17 (×13): 0.5 mg via ORAL
  Filled 2022-08-11 (×13): qty 1

## 2022-08-11 MED ORDER — HALOPERIDOL 5 MG PO TABS
5.0000 mg | ORAL_TABLET | Freq: Two times a day (BID) | ORAL | Status: DC
  Administered 2022-08-11 – 2022-08-17 (×13): 5 mg via ORAL
  Filled 2022-08-11 (×13): qty 1

## 2022-08-11 NOTE — Progress Notes (Signed)
Sodium correcting a bit too fast: now 151 down from 159 ~8h ago.  Will switch D5W to NS.

## 2022-08-11 NOTE — ED Notes (Signed)
Pt appears to be sleeping, observed even RR and unlabored, blanket on pt for warmth and comfort, lights off to room to help induce sleep, NAD noted, sitter within view of pt for safety, room secured, plan of care on going, no further concerns as of present

## 2022-08-11 NOTE — Progress Notes (Signed)
PROGRESS NOTE    Paula Dickson  OZD:[id number] DOB: Oct 07, 1966 DOA: 08/10/2022 PCP: [name]    Brief Narrative:  Paula Dickson is a 56 y.o. female with medical history significant of BPD, schizophrenia, schizoaffective,  HTN. She is brought into the emergency department by her sister, [name], via EMS due to concerns over her behavior.  Sister states that she has been confused this week and not taking her gastric medications.  States that she was recently released from [facility] on 07/28/2022 and since then has not been taking medication.  Also states that she has not been eating or drinking fluids.  States that she goes to see her sister daily as patient lives alone and appears to be getting worse. Found to have sodium of 159 and AKI with creat of 2.0 up from a 1.1 baseline.   Assessment and Plan: * AKI (acute kidney injury) ([place]) Likely pre-renal / dehydration / ATN from not eating / drinking due to psych issues. Pt has history of same requiring admission in past, also with hypernatremia as well at that time.  Looks like this last occurred in March 2022.  During that admission: pt improved with IVF bolus initially followed by D5W for hypernatremia. -Free water deficit = 5.1L -1L NS bolus in ED, also adding 1L LR bolus -D5W at 100 cc/hr thereafter. -BMP Q6H -maximum safe rate at which the serum sodium concentration should be lowered is 12 mEq/L per day - Switch D5W to  NS since correcting fast  Hypernatremia See above  Schizoaffective disorder, bipolar type [facility]) Psychiatry consult for psych meds Pt is IVCd currently per Dr. Alcario Drought.  Hyperthyroidism H/o hyperthyroidism, presumably not taking her tapazole nor propranolol either (in addition to any meds, food, or fluids for that matter recently). -resume meds  Sinus tachycardia -tele -could be related to hyperthyroidism  Obesity Estimated body mass index is 31.24 kg/m as calculated from the following:   Height  as of this encounter: '5\' 1"'$  (1.549 m).   Weight as of this encounter: 75 kg.      DVT prophylaxis: enoxaparin (LOVENOX) injection 30 mg Start: 08/11/22 1000    Code Status: Full Code Family Communication:   Disposition Plan:  Level of care: Telemetry Status is: Inpatient Remains inpatient appropriate because: needs IVF    Consultants:  psych   Subjective: rambling  Objective: Vitals:   08/11/22 0400 08/11/22 0500 08/11/22 0600 08/11/22 0900  BP: 137/85 137/85 128/79 (!) 138/90  Pulse: 93 97 99 86  Resp: '14 14 14 14  '$ Temp:   (!) 97.5 F (36.4 C)   TempSrc:   Oral   SpO2: 100% 99% 96% 98%  Weight:      Height:        Intake/Output Summary (Last 24 hours) at 08/11/2022 1036 Last data filed at 08/11/2022 0230 Gross per 24 hour  Intake 3410 ml  Output --  Net 3410 ml   Filed Weights   08/10/22 1523  Weight: 75 kg    Examination:   General: Appearance:    Obese female in no acute distress     Lungs:     respirations unlabored  Heart:    Normal heart rate.     MS:   All extremities are intact.    Neurologic:   Awake, alert- rambles       Data Reviewed: I have personally reviewed following labs and imaging studies  CBC: Recent Labs  Lab 08/10/22 1845 08/11/22 0536  WBC 7.8 9.8  NEUTROABS 4.7  --   HGB 16.4* 12.5  HCT 52.4* 39.4  MCV 89.0 88.7  PLT 275 154   Basic Metabolic Panel: Recent Labs  Lab 08/10/22 1845 08/11/22 0054 08/11/22 0536  NA 159* 151* 150*  K 4.0 3.5 3.5  CL 123* 121* 122*  CO2 '23 24 24  '$ GLUCOSE 119* 147* 118*  BUN 44* 44* 42*  CREATININE 2.02* 1.72* 1.65*  CALCIUM 11.4* 9.6 9.8   GFR: Estimated Creatinine Clearance: 34.4 mL/min (A) (by C-G formula based on SCr of 1.65 mg/dL (H)). Liver Function Tests: Recent Labs  Lab 08/10/22 1845  AST 21  ALT 16  ALKPHOS 92  BILITOT 1.3*  PROT 8.1  ALBUMIN 4.5   No results for input(s): "LIPASE", "AMYLASE" in the last 168 hours. No results for input(s): "AMMONIA" in  the last 168 hours. Coagulation Profile: No results for input(s): "INR", "PROTIME" in the last 168 hours. Cardiac Enzymes: No results for input(s): "CKTOTAL", "CKMB", "CKMBINDEX", "TROPONINI" in the last 168 hours. BNP (last 3 results) No results for input(s): "PROBNP" in the last 8760 hours. HbA1C: No results for input(s): "HGBA1C" in the last 72 hours. CBG: No results for input(s): "GLUCAP" in the last 168 hours. Lipid Profile: No results for input(s): "CHOL", "HDL", "LDLCALC", "TRIG", "CHOLHDL", "LDLDIRECT" in the last 72 hours. Thyroid Function Tests: Recent Labs    08/10/22 2032 08/10/22 2033  TSH <0.010*  --   FREET4  --  1.87*   Anemia Panel: No results for input(s): "VITAMINB12", "FOLATE", "FERRITIN", "TIBC", "IRON", "RETICCTPCT" in the last 72 hours. Sepsis Labs: No results for input(s): "PROCALCITON", "LATICACIDVEN" in the last 168 hours.  Recent Results (from the past 240 hour(s))  Resp Panel by RT-PCR (Flu A&B, Covid) Anterior Nasal Swab     Status: None   Collection Time: 08/10/22  4:21 PM   Specimen: Anterior Nasal Swab  Result Value Ref Range Status   SARS Coronavirus 2 by RT PCR NEGATIVE NEGATIVE Final    Comment: (NOTE) SARS-CoV-2 target nucleic acids are NOT DETECTED.  The SARS-CoV-2 RNA is generally detectable in upper respiratory specimens during the acute phase of infection. The lowest concentration of SARS-CoV-2 viral copies this assay can detect is 138 copies/mL. A negative result does not preclude SARS-Cov-2 infection and should not be used as the sole basis for treatment or other patient management decisions. A negative result may occur with  improper specimen collection/handling, submission of specimen other than nasopharyngeal swab, presence of viral mutation(s) within the areas targeted by this assay, and inadequate number of viral copies(<138 copies/mL). A negative result must be combined with clinical observations, patient history, and  epidemiological information. The expected result is Negative.  Fact Sheet for Patients:  EntrepreneurPulse.com.au  Fact Sheet for Healthcare Providers:  IncredibleEmployment.be  This test is no t yet approved or cleared by the [place] FDA and  has been authorized for detection and/or diagnosis of SARS-CoV-2 by FDA under an Emergency Use Authorization (EUA). This EUA will remain  in effect (meaning this test can be used) for the duration of the COVID-19 declaration under Section 564(b)(1) of the Act, 21 U.S.C.section 360bbb-3(b)(1), unless the authorization is terminated  or revoked sooner.       Influenza A by PCR NEGATIVE NEGATIVE Final   Influenza B by PCR NEGATIVE NEGATIVE Final    Comment: (NOTE) The Xpert Xpress SARS-CoV-2/FLU/RSV plus assay is intended as an aid in the diagnosis of influenza from Nasopharyngeal swab specimens and should not be used  as a sole basis for treatment. Nasal washings and aspirates are unacceptable for Xpert Xpress SARS-CoV-2/FLU/RSV testing.  Fact Sheet for Patients: EntrepreneurPulse.com.au  Fact Sheet for Healthcare Providers: IncredibleEmployment.be  This test is not yet approved or cleared by the [place] FDA and has been authorized for detection and/or diagnosis of SARS-CoV-2 by FDA under an Emergency Use Authorization (EUA). This EUA will remain in effect (meaning this test can be used) for the duration of the COVID-19 declaration under Section 564(b)(1) of the Act, 21 U.S.C. section 360bbb-3(b)(1), unless the authorization is terminated or revoked.  Performed at [facility], [place] [address] [place], [place] [address]          Radiology Studies: No results found.      Scheduled Meds:  benztropine  0.5 mg Oral BID   enoxaparin (LOVENOX) injection  30 mg Subcutaneous Q24H   haloperidol  5 mg Oral BID   methimazole  10  mg Oral TID   propranolol  10 mg Oral BID   Continuous Infusions:  sodium chloride 100 mL/hr at 08/11/22 0251     LOS: 1 day    Time spent: 45 minutes spent on chart review, discussion with nursing staff, consultants, updating family and interview/physical exam; more than 50% of that time was spent in counseling and/or coordination of care.    Geradine Girt, DO [facility] Available via Epic secure chat 7am-7pm After these hours, please refer to coverage provider listed on [email or web address] 08/11/2022, 10:36 AM

## 2022-08-11 NOTE — Progress Notes (Signed)
Propranolol appears to have worked, HR now improved to 103, BP still 494 systolic.  RN was able to convince pt to take tapazole.  Plan to start PO propranolol in AM (though may end up requiring more of a dose than the '10mg'$  BID I have ordered currently), in the mean time re-dose IV propranolol if needed overnight for recurrent tachycardia.

## 2022-08-11 NOTE — ED Notes (Signed)
Lab called, they need a re-collect on pt's BMP

## 2022-08-11 NOTE — ED Notes (Signed)
Asked pt if she could provide urine sample. Pt asked for water and ice despite having several cups and ice spilled on the bed. Rambling.

## 2022-08-11 NOTE — Consult Note (Addendum)
Paula Dickson is a 56 year old female with medical history significant bipolar disorder, schizophrenia, schizoaffective, hypertension.  Patient's current emergency room and hospitalization has been complicated by hypernatremia, acute kidney injury, and hyperthyroidism thyrotoxicosis.  Further chart review shows patient was recently released from [facility] inpatient psychiatric hospital on September 1, length of stay 16 days.  Patient continues to present as psychotic, with manic features.  Patient was seen and assessed by psychiatric ED provider yesterday, and it was difficult to assess other psychiatric symptoms due to her disorganized thinking speech and flight of ideas.  Psychiatry consult placed for psychosis.  Patient's current outside records indicate she is taking Cogentin 0.5 mg p.o. twice daily, Haldol 5 mg p.o. daily, Ativan 1 mg p.o. 3 times daily, olanzapine 10 mg p.o. nightly.  Please note she is currently taking propranolol 10 mg p.o. twice daily, however this is managed for symptoms related to hyperthyroidism and not for psychiatric indication.  Will recommend working closely with pharmacy to verify medications and any changes, as patient was recently released from inpatient psychiatric hospital.  Those medical records are not available to review in our electronic medical records.  To prevent further decompensation of schizophrenia and bipolar disorder, will recommend initiating her Haldol today and resuming her olanzapine tomorrow once sodium has returned to normal limits.  -Will place an order for Haldol 5 mg po BID. PDMP reviewed, last filled prescription for Ativan in February 2023.  Will not restart at this time unless outside medical records prove otherwise.  We will place order for olanzapine to start tomorrow night. -Recommend treating hyperthyroidism thyrotoxicosis, as this can also be contributing to her worsening psychosis, delirium, rebound mania.  In addition to the patient has  history of severe hypernatremia when acutely psychotic, likely due to poor oral intake and diminish thirst mechanism. -Psychiatry consult service will continue to follow at this time.   -Final disposition is pending as patient may not require another inpatient psychiatric hospitalization considering she was just released less than 2 weeks ago.

## 2022-08-12 DIAGNOSIS — N179 Acute kidney failure, unspecified: Secondary | ICD-10-CM | POA: Diagnosis not present

## 2022-08-12 LAB — BASIC METABOLIC PANEL
Anion gap: 4 — ABNORMAL LOW (ref 5–15)
BUN: 28 mg/dL — ABNORMAL HIGH (ref 6–20)
CO2: 24 mmol/L (ref 22–32)
Calcium: 9.4 mg/dL (ref 8.9–10.3)
Chloride: 120 mmol/L — ABNORMAL HIGH (ref 98–111)
Creatinine, Ser: 0.98 mg/dL (ref 0.44–1.00)
GFR, Estimated: >60 mL/min (ref >60)
Glucose, Bld: 101 mg/dL — ABNORMAL HIGH (ref 70–99)
Potassium: 3.6 mmol/L (ref 3.5–5.1)
Sodium: 148 mmol/L — ABNORMAL HIGH (ref 135–145)

## 2022-08-12 LAB — HCG, QUANTITATIVE, PREGNANCY: hCG, Beta Chain, Quant, S: 3 m[IU]/mL (ref <5)

## 2022-08-12 MED ORDER — SODIUM CHLORIDE 0.45 % IV SOLN: INTRAVENOUS | Status: DC

## 2022-08-12 MED ORDER — ENOXAPARIN SODIUM 40 MG/0.4ML IJ SOSY
40.0000 mg | PREFILLED_SYRINGE | INTRAMUSCULAR | Status: DC
  Administered 2022-08-12 – 2022-08-17 (×6): 40 mg via SUBCUTANEOUS
  Filled 2022-08-12 (×6): qty 0.4

## 2022-08-12 NOTE — Hospital Course (Addendum)
56 y.o. female with medical history significant of BPD, schizophrenia, schizoaffective,  HTN brought to ED by sister, Paula Dickson, via EMS due to concerns over her behavior.  Sister states that she has been confused this week and not taking her gastric medications.  States that she was recently released from [facility] on 07/28/2022 and since then has not been taking medication.  Also states that she has not been eating or drinking fluids.  States that she goes to see her sister daily as patient lives alone and appears to be getting worse. Found to have sodium of 159 and AKI with creat of 2.0 up from a 1.1 baseline.Patient treated with IV fluids and serial electrolytes monitored with improvement.  At this time electrolytes improving.  Sodium has improved.  Tolerating diet.  Mental status improving patient continues to need inpatient psychiatric unit and is being discharged care in medically stable condition.

## 2022-08-12 NOTE — Progress Notes (Signed)
PROGRESS NOTE Paula Dickson  CBS:[id number] DOB: 10/19/1966 DOA: 08/10/2022 PCP: [name]   Brief Narrative/Hospital Course: 56 y.o. female with medical history significant of BPD, schizophrenia, schizoaffective,  HTN brought to ED by sister, [name], via EMS due to concerns over her behavior.  Sister states that she has been confused this week and not taking her gastric medications.  States that she was recently released from [facility] on 07/28/2022 and since then has not been taking medication.  Also states that she has not been eating or drinking fluids.  States that she goes to see her sister daily as patient lives alone and appears to be getting worse. Found to have sodium of 159 and AKI with creat of 2.0 up from a 1.1 baseline. Patient treated with IV fluids and serial electrolytes monitored with improvement.     Subjective: Seen and examined this morning alert awake interactive speaks in high pressured speech and starts to laugh during speech, sitter at the bedside states patient has been talking constantly to herself  Assessment and Plan: Principal Problem:   AKI (acute kidney injury) ([place]) Active Problems:   Hypernatremia   Hyperthyroidism   Schizoaffective disorder, bipolar type ([place])   Sinus tachycardia   AKI: Prerenal has resolved with IV fluids.  Encourage oral hydration decrease IV fluids Recent Labs  Lab 08/11/22 0054 08/11/22 0536 08/11/22 1207 08/11/22 1735 08/12/22 0036  BUN 44* 42* 37* 31* 28*  CREATININE 1.72* 1.65* 1.32* 1.11* 0.98     Hypernatremia: Sodium improved to 148.  On admission 151- encourage replacement of free water deficit orally decrease IVF Recent Labs  Lab 08/11/22 0054 08/11/22 0536 08/11/22 1207 08/11/22 1735 08/12/22 0036  NA 151* 150* 146* 147* 148*    Schizoaffective disorder, bipolar type Psychosis: Psychiatry consulted. She was IVCd per Dr. Alcario Drought.  Restarted on antipsychotics: Cogentin/haloperidol/Zyprexa and being  monitored and needs inpatient psych admission   Hyperthyroidism Sinus tachycardia: In the setting of not taking Tapazole or propanolol.  Back on home meds heart rate is stable.   DVT prophylaxis: enoxaparin (LOVENOX) injection 40 mg Start: 08/12/22 1000 Code Status:   Code Status: Full Code Family Communication: plan of care discussed with patient/RN at bedside. Patient status is: Inpatient because of electrolyte imbalance and need for inpatient psych Level of care: Telemetry   Dispo: The patient is from: home            Anticipated disposition: Inpatient psychiatry  Mobility Assessment (last 72 hours)     Mobility Assessment     Row Name 08/11/22 2157 08/11/22 1405         Does patient have an order for bedrest or is patient medically unstable No - Continue assessment No - Continue assessment      What is the highest level of mobility based on the progressive mobility assessment? Level 4 (Walks with assist in room) - Balance while marching in place and cannot step forward and back - Complete Level 5 (Walks with assist in room/hall) - Balance while stepping forward/back and can walk in room with assist - Complete                Objective: Vitals last 24 hrs: Vitals:   08/11/22 2046 08/12/22 0039 08/12/22 0406 08/12/22 0630  BP: 128/67 (!) 152/82 (!) 159/91 (!) 153/81  Pulse: 86 88 78 77  Resp: 16 16    Temp: 98.7 F (37.1 C) 98.9 F (37.2 C) 98.3 F (36.8 C)   TempSrc: Oral Oral  Oral   SpO2: 100% 100% 100%   Weight:      Height:       Weight change: -15.3 kg  Physical Examination: General exam: alert awake, oriented to current place current president self but not to current date older than stated age, weak appearing.  Speaks rapidly HEENT:Oral mucosa moist, Ear/Nose WNL grossly, dentition normal. Respiratory system: bilaterally clear BS, no use of accessory muscle Cardiovascular system: S1 & S2 +, No JVD. Gastrointestinal system: Abdomen soft,NT,ND, BS+ Nervous  System:Alert, awake, moving extremities and grossly nonfocal Extremities: LE edema neg,distal peripheral pulses palpable.  Skin: No rashes,no icterus. MSK: Normal muscle bulk,tone, power  Medications reviewed:  Scheduled Meds:  benztropine  0.5 mg Oral BID   enoxaparin (LOVENOX) injection  40 mg Subcutaneous Q24H   haloperidol  5 mg Oral BID   methimazole  10 mg Oral TID   OLANZapine  5 mg Oral QHS   propranolol  10 mg Oral BID   Continuous Infusions:  sodium chloride 100 mL/hr at 08/12/22 0636   Unresulted Labs (From admission, onward)    None      Data Reviewed: I have personally reviewed following labs and imaging studies CBC: Recent Labs  Lab 08/10/22 1845 08/11/22 0536  WBC 7.8 9.8  NEUTROABS 4.7  --   HGB 16.4* 12.5  HCT 52.4* 39.4  MCV 89.0 88.7  PLT 275 518   Basic Metabolic Panel: Recent Labs  Lab 08/11/22 0054 08/11/22 0536 08/11/22 1207 08/11/22 1735 08/12/22 0036  NA 151* 150* 146* 147* 148*  K 3.5 3.5 3.6 3.3* 3.6  CL 121* 122* 118* 119* 120*  CO2 '24 24 24 23 24  '$ GLUCOSE 147* 118* 141* 94 101*  BUN 44* 42* 37* 31* 28*  CREATININE 1.72* 1.65* 1.32* 1.11* 0.98  CALCIUM 9.6 9.8 9.4 9.2 9.4   GFR: Estimated Creatinine Clearance: 52 mL/min (by C-G formula based on SCr of 0.98 mg/dL). Liver Function Tests: Recent Labs  Lab 08/10/22 1845  AST 21  ALT 16  ALKPHOS 92  BILITOT 1.3*  PROT 8.1  ALBUMIN 4.5    Recent Labs    08/10/22 2032 08/10/22 2033  TSH <0.010*  --   FREET4  --  1.87*  Antimicrobials: Anti-infectives (From admission, onward)    None      Culture/Microbiology none Radiology Studies: No results found.   LOS: 2 days   Antonieta Pert, MD [facility]  08/12/2022, 10:42 AM

## 2022-08-12 NOTE — Progress Notes (Signed)
A consult was placed to the IV Therapist for new iv access; both arms assessed thoroughly with ultrasound; no suitable veins noted; pt is drinking large amounts of fluids and urine output has been significant;  pt is without iv access at this time; RN aware.

## 2022-08-13 DIAGNOSIS — N179 Acute kidney failure, unspecified: Secondary | ICD-10-CM | POA: Diagnosis not present

## 2022-08-13 LAB — BASIC METABOLIC PANEL
Anion gap: 7 (ref 5–15)
BUN: 17 mg/dL (ref 6–20)
CO2: 26 mmol/L (ref 22–32)
Calcium: 10 mg/dL (ref 8.9–10.3)
Chloride: 115 mmol/L — ABNORMAL HIGH (ref 98–111)
Creatinine, Ser: 0.97 mg/dL (ref 0.44–1.00)
GFR, Estimated: >60 mL/min (ref >60)
Glucose, Bld: 100 mg/dL — ABNORMAL HIGH (ref 70–99)
Potassium: 3.6 mmol/L (ref 3.5–5.1)
Sodium: 148 mmol/L — ABNORMAL HIGH (ref 135–145)

## 2022-08-13 NOTE — Progress Notes (Signed)
PROGRESS NOTE Paula Dickson  KCL:[id number] DOB: November 22, 1966 DOA: 08/10/2022 PCP: [name]   Brief Narrative/Hospital Course: 56 y.o. female with medical history significant of BPD, schizophrenia, schizoaffective,  HTN brought to ED by sister, [name], via EMS due to concerns over her behavior.  Sister states that she has been confused this week and not taking her gastric medications.  States that she was recently released from [facility] on 07/28/2022 and since then has not been taking medication.  Also states that she has not been eating or drinking fluids.  States that she goes to see her sister daily as patient lives alone and appears to be getting worse. Found to have sodium of 159 and AKI with creat of 2.0 up from a 1.1 baseline. Patient treated with IV fluids and serial electrolytes monitored with improvement.  At this time electrolytes improving    Subjective: Seen and examined this morning, sitter at the bedside, she was resting comfortably/sleeping. Overnight patient has been afebrile, unable to obtain IV access despite multiple efforts with the help of imaging-and allow for the procedure as patient is drinking well  Assessment and Plan: Principal Problem:   AKI (acute kidney injury) ([place]) Active Problems:   Hypernatremia   Hyperthyroidism   Schizoaffective disorder, bipolar type ([place])   Sinus tachycardia   AKI: Prerenal AKI in the setting of psychiatric illness with decreased oral intake.  It has resolved at this time drinking orally.   Hypernatremia: Sodium has stabilized, but change to hypotonic fluid 9/16 remains at 148, will encourage oral hydration.  No IV access at this time-discontinue IV fluids. Repeat BMP in AM.  Recent Labs  Lab 08/11/22 0536 08/11/22 1207 08/11/22 1735 08/12/22 0036 08/13/22 0519  NA 150* 146* 147* 148* 148*    Schizoaffective disorder, bipolar type Psychosis: Psychiatry following input appreciated. She was IVCd per Dr. Alcario Drought. Back on  antipsychotics: Cogentin/haloperidol/Zyprexa and being monitored and needs inpatient psych admission   Hyperthyroidism Sinus tachycardia: In the setting of not taking Tapazole or propanolol.  Vitals are stable continue home meds  DVT prophylaxis: enoxaparin (LOVENOX) injection 40 mg Start: 08/12/22 1000 Code Status:   Code Status: Full Code Family Communication: plan of care discussed with patient/RN at bedside. Patient status is: Inpatient because of electrolyte imbalance and need for inpatient psych Level of care: Telemetry   Dispo: The patient is from: home            Anticipated disposition: Inpatient psychiatry> likely tomorrow if electrolytes are stable  Mobility Assessment (last 72 hours)     Mobility Assessment     Row Name 08/12/22 2030 08/12/22 4944 08/11/22 2157 08/11/22 1405     Does patient have an order for bedrest or is patient medically unstable No - Continue assessment No - Continue assessment No - Continue assessment No - Continue assessment    What is the highest level of mobility based on the progressive mobility assessment? Level 4 (Walks with assist in room) - Balance while marching in place and cannot step forward and back - Complete Level 4 (Walks with assist in room) - Balance while marching in place and cannot step forward and back - Complete Level 4 (Walks with assist in room) - Balance while marching in place and cannot step forward and back - Complete Level 5 (Walks with assist in room/hall) - Balance while stepping forward/back and can walk in room with assist - Complete              Objective:  Vitals last 24 hrs: Vitals:   08/12/22 1057 08/12/22 1837 08/12/22 2047 08/13/22 0640  BP: (!) 160/93 (!) 163/99 (!) 138/96 135/80  Pulse: 92 79 74 73  Resp:  '18 17 18  '$ Temp:  98.1 F (36.7 C) 98.8 F (37.1 C) 98.1 F (36.7 C)  TempSrc:  Oral Oral Axillary  SpO2:  100% 92% 100%  Weight:      Height:       Weight change:   Physical  Examination: General exam: Sleeping, older than stated age, weak appearing. HEENT:Oral mucosa moist, Ear/Nose WNL grossly, dentition normal. Respiratory system: bilaterally diminished, no use of accessory muscle Cardiovascular system: S1 & S2 +, No JVD,. Gastrointestinal system: Abdomen soft,NT,ND,BS+ Nervous System: sleeping Extremities: LE ankle edema , distal peripheral pulses palpable.  Skin: No rashes,no icterus. MSK: Normal muscle bulk,tone, power   Medications reviewed:  Scheduled Meds:  benztropine  0.5 mg Oral BID   enoxaparin (LOVENOX) injection  40 mg Subcutaneous Q24H   haloperidol  5 mg Oral BID   methimazole  10 mg Oral TID   OLANZapine  5 mg Oral QHS   propranolol  10 mg Oral BID   Continuous Infusions:  sodium chloride 50 mL/hr at 08/12/22 1257   Unresulted Labs (From admission, onward)     Start     Ordered   08/13/22 0932  Basic metabolic panel  Daily at 5am,   R      08/12/22 1045           Data Reviewed: I have personally reviewed following labs and imaging studies CBC: Recent Labs  Lab 08/10/22 1845 08/11/22 0536  WBC 7.8 9.8  NEUTROABS 4.7  --   HGB 16.4* 12.5  HCT 52.4* 39.4  MCV 89.0 88.7  PLT 275 671   Basic Metabolic Panel: Recent Labs  Lab 08/11/22 0536 08/11/22 1207 08/11/22 1735 08/12/22 0036 08/13/22 0519  NA 150* 146* 147* 148* 148*  K 3.5 3.6 3.3* 3.6 3.6  CL 122* 118* 119* 120* 115*  CO2 '24 24 23 24 26  '$ GLUCOSE 118* 141* 94 101* 100*  BUN 42* 37* 31* 28* 17  CREATININE 1.65* 1.32* 1.11* 0.98 0.97  CALCIUM 9.8 9.4 9.2 9.4 10.0   GFR: Estimated Creatinine Clearance: 52.5 mL/min (by C-G formula based on SCr of 0.97 mg/dL). Liver Function Tests: Recent Labs  Lab 08/10/22 1845  AST 21  ALT 16  ALKPHOS 92  BILITOT 1.3*  PROT 8.1  ALBUMIN 4.5    Recent Labs    08/10/22 2032 08/10/22 2033  TSH <0.010*  --   FREET4  --  1.87*  Antimicrobials: Anti-infectives (From admission, onward)    None       Culture/Microbiology none Radiology Studies: No results found.   LOS: 3 days   Antonieta Pert, MD [facility]  08/13/2022, 10:37 AM

## 2022-08-13 NOTE — Consult Note (Signed)
Progress Note: 08/13/22  Paula Dickson is a 56 year old female patient with a history of schizoaffective disorder-bipolar type, HTN who presents to [facility] via EMS due to aggressive behavior. Patient is currently IVCed due to: " aggressive behavior, confusion and psychosis, hallucinating about seeing things on the floor, not taking psychiatric medications and threatening family, not eating or drinking at home and caring for personal hygiene" She has been in and out of psychiatric hospital and her most recent hospitalization was at [facility] inpatient psychiatric hospital on September 1, where she stayed for about 2 weeks. Current hospitalization  has been complicated by hypernatremia, acute kidney injury, and hyperthyroidism thyrotoxicosis.  Psychiatry consult placed for psychosis. Today, patient is alert, awake, calm, cooperative but only oriented to person and place but not to time. He thoughts process is disorganized, reports decreased psychosis, denies delusions and self harming thoughts. Per staff nurse, she has been compliant with her medications and eating fine.  Recommendations/Plan: -Continue 1:1 sitter for safety -To prevent further decompensation of schizophrenia and bipolar disorder, continue Haldol as prescribed and resume Olanzapine  once sodium has returned to normal limits.  -Recommend treating hyperthyroidism thyrotoxicosis, as this can also be contributing to her worsening psychosis, delirium, rebound mania.  In addition to the patient has history of severe hypernatremia when acutely psychotic, likely due to poor oral intake and diminish thirst mechanism. -Psychiatry consult service will continue to follow at this time.   -Final disposition is pending as patient may not require another inpatient psychiatric hospitalization considering she was just released less than 2 weeks ago and seems to be getting better after compliance with medications at the hospital   Corena Pilgrim,  MD Attending psychiatrist.

## 2022-08-13 NOTE — Plan of Care (Signed)
  Problem: Clinical Measurements: Goal: Diagnostic test results will improve Outcome: Progressing   Problem: Activity: Goal: Risk for activity intolerance will decrease Outcome: Progressing   Problem: Safety: Goal: Ability to remain free from injury will improve Outcome: Progressing   

## 2022-08-14 DIAGNOSIS — N179 Acute kidney failure, unspecified: Secondary | ICD-10-CM | POA: Diagnosis not present

## 2022-08-14 LAB — BASIC METABOLIC PANEL
Anion gap: 8 (ref 5–15)
BUN: 27 mg/dL — ABNORMAL HIGH (ref 6–20)
CO2: 28 mmol/L (ref 22–32)
Calcium: 9.7 mg/dL (ref 8.9–10.3)
Chloride: 112 mmol/L — ABNORMAL HIGH (ref 98–111)
Creatinine, Ser: 1.24 mg/dL — ABNORMAL HIGH (ref 0.44–1.00)
GFR, Estimated: 50 mL/min — ABNORMAL LOW (ref >60)
Glucose, Bld: 104 mg/dL — ABNORMAL HIGH (ref 70–99)
Potassium: 3.7 mmol/L (ref 3.5–5.1)
Sodium: 148 mmol/L — ABNORMAL HIGH (ref 135–145)

## 2022-08-14 MED ORDER — DEXTROSE 5 % IV SOLN: INTRAVENOUS | Status: DC

## 2022-08-14 NOTE — Progress Notes (Signed)
PROGRESS NOTE Paula Dickson  YYT:[id number] DOB: 1965-12-29 DOA: 08/10/2022 PCP: [name]   Brief Narrative/Hospital Course: 56 y.o. female with medical history significant of BPD, schizophrenia, schizoaffective,  HTN brought to ED by sister, [name], via EMS due to concerns over her behavior.  Sister states that she has been confused this week and not taking her gastric medications.  States that she was recently released from [facility] on 07/28/2022 and since then has not been taking medication.  Also states that she has not been eating or drinking fluids.  States that she goes to see her sister daily as patient lives alone and appears to be getting worse. Found to have sodium of 159 and AKI with creat of 2.0 up from a 1.1 baseline. Patient treated with IV fluids and serial electrolytes monitored with improvement.  At this time electrolytes improving    Subjective: Seen and examined. Sitter at the bedside sleeping comfortably did not wake up able to interact Overnight afebrile, drinking oral adequately Labs with sodium stable at 148, chloride improving creatinine slightly up 0.9<1.2 Has been taking her meds here  Assessment and Plan: Principal Problem:   AKI (acute kidney injury) ([place]) Active Problems:   Hypernatremia   Hyperthyroidism   Schizoaffective disorder, bipolar type ([place])   Sinus tachycardia   EXN:[id number] AKI in the setting of psychiatric illness with decreased oral intake.  AKI has resolved, overnight getting slightly up, encourage p.o., start IVFif abe to get access  Hypernatremia: Sodium holding steady at 148 taking p.o.  If able to get IV will give brief IVF. Monitor  Recent Labs  Lab 08/11/22 1207 08/11/22 1735 08/12/22 0036 08/13/22 0519 08/14/22 0436  NA 146* 147* 148* 148* 148*     Schizoaffective disorder, bipolar type Psychosis: Psychiatry following input appreciated. She was IVCd per Dr. Alcario Drought. Back on antipsychotics: Cogentin/haloperidol/Zyprexa  and being monitored by psychiatry> as per psych patient is clinically improving and may not need psych hospitalization, was just hospitalized 2 weeks ago.  Await for final psych recommendation  Hyperthyroidism Sinus tachycardia: In the setting of known compliance, taking p.o. meds, appears stable, continue Tapazole and propanolol.  DVT prophylaxis: enoxaparin (LOVENOX) injection 40 mg Start: 08/12/22 1000 Code Status:   Code Status: Full Code Family Communication: plan of care discussed with patient/RN at bedside. Patient status is: Inpatient because of electrolyte imbalance   Level of care: Telemetry  Dispo: The patient is from: home            Anticipated disposition: Inpatient psychiatry versus home-await final psychiatry recommendation, Will obtain PT OT evaluation today and mobilize  Mobility Assessment (last 72 hours)     Mobility Assessment     Row Name 08/13/22 1006 08/12/22 2030 08/12/22 0938 08/11/22 2157 08/11/22 1405   Does patient have an order for bedrest or is patient medically unstable No - Continue assessment No - Continue assessment No - Continue assessment No - Continue assessment No - Continue assessment   What is the highest level of mobility based on the progressive mobility assessment? Level 4 (Walks with assist in room) - Balance while marching in place and cannot step forward and back - Complete Level 4 (Walks with assist in room) - Balance while marching in place and cannot step forward and back - Complete Level 4 (Walks with assist in room) - Balance while marching in place and cannot step forward and back - Complete Level 4 (Walks with assist in room) - Balance while marching in place and cannot  step forward and back - Complete Level 5 (Walks with assist in room/hall) - Balance while stepping forward/back and can walk in room with assist - Complete             Objective: Vitals last 24 hrs: Vitals:   08/13/22 0640 08/13/22 1320 08/13/22 2033 08/14/22 0246   BP: 135/80 103/80 133/81 113/71  Pulse: 73 83 92 84  Resp: 18 18    Temp: 98.1 F (36.7 C) 98.2 F (36.8 C) 98.5 F (36.9 C) 98.8 F (37.1 C)  TempSrc: Axillary Oral Axillary Oral  SpO2: 100% 99% 100% 96%  Weight:      Height:       Weight change:   Physical Examination: General exam: Sleeping but woke up and interactive.   HEENT:Oral mucosa moist, Ear/Nose WNL grossly, dentition normal. Respiratory system: bilaterally diminished,no use of accessory muscle Cardiovascular system: S1 & S2 +, No JVD,. Gastrointestinal system: Abdomen soft,NT,ND, BS+ Nervous System:Alert, awake, moving extremities and grossly nonfocal Extremities: edema neg,distal peripheral pulses palpable.  Skin: No rashes,no icterus. MSK: Normal muscle bulk,tone, power  Medications reviewed:  Scheduled Meds:  benztropine  0.5 mg Oral BID   enoxaparin (LOVENOX) injection  40 mg Subcutaneous Q24H   haloperidol  5 mg Oral BID   methimazole  10 mg Oral TID   OLANZapine  5 mg Oral QHS   propranolol  10 mg Oral BID   Continuous Infusions:  dextrose     Unresulted Labs (From admission, onward)     Start     Ordered   08/13/22 3557  Basic metabolic panel  Daily at 5am,   R      08/12/22 1045           Data Reviewed: I have personally reviewed following labs and imaging studies CBC: Recent Labs  Lab 08/10/22 1845 08/11/22 0536  WBC 7.8 9.8  NEUTROABS 4.7  --   HGB 16.4* 12.5  HCT 52.4* 39.4  MCV 89.0 88.7  PLT 275 322    Basic Metabolic Panel: Recent Labs  Lab 08/11/22 1207 08/11/22 1735 08/12/22 0036 08/13/22 0519 08/14/22 0436  NA 146* 147* 148* 148* 148*  K 3.6 3.3* 3.6 3.6 3.7  CL 118* 119* 120* 115* 112*  CO2 '24 23 24 26 28  '$ GLUCOSE 141* 94 101* 100* 104*  BUN 37* 31* 28* 17 27*  CREATININE 1.32* 1.11* 0.98 0.97 1.24*  CALCIUM 9.4 9.2 9.4 10.0 9.7    GFR: Estimated Creatinine Clearance: 41.1 mL/min (A) (by C-G formula based on SCr of 1.24 mg/dL (H)). Liver Function  Tests: Recent Labs  Lab 08/10/22 1845  AST 21  ALT 16  ALKPHOS 92  BILITOT 1.3*  PROT 8.1  ALBUMIN 4.5     No results for input(s): "TSH", "T4TOTAL", "FREET4", "T3FREE", "THYROIDAB" in the last 72 hours. Antimicrobials: Anti-infectives (From admission, onward)    None      Culture/Microbiology none Radiology Studies: No results found.   LOS: 4 days   Antonieta Pert, MD [facility]  08/14/2022, 7:46 AM

## 2022-08-14 NOTE — TOC Progression Note (Addendum)
Transition of Care [facility]) - Progression Note    Patient Details  Name: Paula Dickson MRN: [id number] Date of Birth: [birth year]  Transition of Care [facility] CM/SW Contact  Ross Ludwig, [place] Phone Number: 08/14/2022, 10:24 AM  Clinical Narrative:     Patient currently under IVC.  IVC valid till 08/17/2022 at 6:08pm, CSW continuing to follow patient's progress throughout discharge planning.       Expected Discharge Plan and Services                                                 Social Determinants of Health (SDOH) Interventions    Readmission Risk Interventions     No data to display

## 2022-08-14 NOTE — Evaluation (Signed)
Physical Therapy Evaluation Patient Details Name: Paula Dickson MRN: [id number] DOB: 15-May-1966 Today's Date: 08/14/2022  History of Present Illness  56 yo female admitted with AKI, hypernatremia. Pt is under IVC 2*psych reasons. Hx of Afib, schizoaffective/bipolar d/o, medical noncompliance, Afib.  Clinical Impression  On eval, pt was Min guard A for mobility. She walked ~200 feet around the unit without a device. She was pleasant and cooperative. She has some confusion and memory deficits. She required cueing throughout session. No family was present during session. D/C plan will depend on continued medical progress and psych input.      Recommendations for follow up therapy are one component of a multi-disciplinary discharge planning process, led by the attending physician.  Recommendations may be updated based on patient status, additional functional criteria and insurance authorization.  Follow Up Recommendations Home health PT (but depends on medical course/IVC status/psych input)      Assistance Recommended at Discharge Intermittent Supervision/Assistance  Patient can return home with the following  Assist for transportation    Equipment Recommendations None recommended by PT  Recommendations for Other Services       Functional Status Assessment Patient has had a recent decline in their functional status and demonstrates the ability to make significant improvements in function in a reasonable and predictable amount of time.     Precautions / Restrictions Precautions Precautions: None Restrictions Weight Bearing Restrictions: No      Mobility  Bed Mobility Overal bed mobility: Needs Assistance Bed Mobility: Supine to Sit     Supine to sit: Min assist, HOB elevated     General bed mobility comments: small amount of handhold for scoot to EOB. Cues required.    Transfers Overall transfer level: Needs assistance   Transfers: Sit to/from Stand Sit to Stand:  Min guard           General transfer comment: Cues required.    Ambulation/Gait Ambulation/Gait assistance: Min guard Gait Distance (Feet): 200 Feet   Gait Pattern/deviations: Step-through pattern, Decreased stride length       General Gait Details: Min guard for safety. Pt tolerated distance well.  Stairs            Wheelchair Mobility    Modified Rankin (Stroke Patients Only)       Balance Overall balance assessment: Needs assistance         Standing balance support: During functional activity Standing balance-Leahy Scale: Fair                               Pertinent Vitals/Pain Pain Assessment Pain Assessment: 0-10 Pain Score: 5  Pain Location: R knee Pain Descriptors / Indicators: Discomfort, Sore Pain Intervention(s): Monitored during session    Home Living Family/patient expects to be discharged to:: Unsure Living Arrangements: Alone Available Help at Discharge: Family;Available PRN/intermittently Type of Home: House Home Access: Stairs to enter       Home Layout: One level Home Equipment: None      Prior Function                 ADLs Comments: pt reports sister (s) help PRN     Hand Dominance        Extremity/Trunk Assessment   Upper Extremity Assessment Upper Extremity Assessment: Defer to OT evaluation    Lower Extremity Assessment Lower Extremity Assessment: Overall WFL for tasks assessed    Cervical / Trunk Assessment Cervical / Trunk Assessment:  Normal  Communication   Communication: No difficulties  Cognition Arousal/Alertness: Awake/alert Behavior During Therapy: WFL for tasks assessed/performed Overall Cognitive Status: No family/caregiver present to determine baseline cognitive functioning                                 General Comments: pt had some memory deficits        General Comments      Exercises     Assessment/Plan    PT Assessment Patient needs continued  PT services  PT Problem List Decreased mobility       PT Treatment Interventions Gait training;Therapeutic activities;Therapeutic exercise;Patient/family education;Functional mobility training    PT Goals (Current goals can be found in the Care Plan section)  Acute Rehab PT Goals Patient Stated Goal: none stated PT Goal Formulation: With patient Time For Goal Achievement: 08/28/22 Potential to Achieve Goals: Good    Frequency Min 3X/week     Co-evaluation               AM-PAC PT "6 Clicks" Mobility  Outcome Measure Help needed turning from your back to your side while in a flat bed without using bedrails?: A Little Help needed moving from lying on your back to sitting on the side of a flat bed without using bedrails?: A Little Help needed moving to and from a bed to a chair (including a wheelchair)?: A Little Help needed standing up from a chair using your arms (e.g., wheelchair or bedside chair)?: A Little Help needed to walk in hospital room?: A Little Help needed climbing 3-5 steps with a railing? : A Little 6 Click Score: 18    End of Session Equipment Utilized During Treatment: Gait belt Activity Tolerance: Patient tolerated treatment well Patient left: in bed;with call bell/phone within reach;with nursing/sitter in room   PT Visit Diagnosis: Unsteadiness on feet (R26.81)    Time: 5284-1324 PT Time Calculation (min) (ACUTE ONLY): 11 min   Charges:   PT Evaluation $PT Eval Low Complexity: [place], PT Acute Rehabilitation  Office: [phone] Pager: [phone]

## 2022-08-15 DIAGNOSIS — N179 Acute kidney failure, unspecified: Secondary | ICD-10-CM | POA: Diagnosis not present

## 2022-08-15 LAB — BASIC METABOLIC PANEL
Anion gap: 8 (ref 5–15)
BUN: 16 mg/dL (ref 6–20)
CO2: 29 mmol/L (ref 22–32)
Calcium: 9.9 mg/dL (ref 8.9–10.3)
Chloride: 107 mmol/L (ref 98–111)
Creatinine, Ser: 1.22 mg/dL — ABNORMAL HIGH (ref 0.44–1.00)
GFR, Estimated: 51 mL/min — ABNORMAL LOW (ref >60)
Glucose, Bld: 107 mg/dL — ABNORMAL HIGH (ref 70–99)
Potassium: 3.7 mmol/L (ref 3.5–5.1)
Sodium: 144 mmol/L (ref 135–145)

## 2022-08-15 NOTE — Progress Notes (Signed)
Mobility Specialist - Progress Note   08/15/22 1506  Mobility  HOB Elevated/Bed Position Self regulated  Activity Ambulated with assistance in hallway  Range of Motion/Exercises Active  Level of Assistance Independent after set-up  Assistive Device  (IV Pole)  Distance Ambulated (ft) 300 ft  Activity Response Tolerated well  Transport method Ambulatory  $Mobility charge 1 Mobility   Pt received in bed and agreeable to mobility. Pt pleasantly confused during ambulation. Pt to room w/ NT present and all necessities in reach.     [facility]

## 2022-08-15 NOTE — Evaluation (Signed)
Occupational Therapy Evaluation Patient Details Name: Paula Dickson MRN: [id number] DOB: [birth year] Today's Date: 08/15/2022   History of Present Illness Patient is a 56 year old female admitted with AKI, hypernatremia. Pt is under IVC 2*psych reasons. Hx of Afib, schizoaffective/bipolar d/o, medical noncompliance, Afib.   Clinical Impression   Patient evaluated by Occupational Therapy with no further acute OT needs identified. All education has been completed and the patient has no further questions. Patient endorsed being at baseline. Patient need supervision with current confusion levels for ADLs. Patient does not require any physical assistance.  See below for any follow-up Occupational Therapy or equipment needs. OT is signing off. Thank you for this referral.       Recommendations for follow up therapy are one component of a multi-disciplinary discharge planning process, led by the attending physician.  Recommendations may be updated based on patient status, additional functional criteria and insurance authorization.   Follow Up Recommendations  No OT follow up    Assistance Recommended at Discharge Frequent or constant Supervision/Assistance  Patient can return home with the following Direct supervision/assist for medications management;Direct supervision/assist for financial management;Assist for transportation;Assistance with cooking/housework    Functional Status Assessment  Patient has not had a recent decline in their functional status  Equipment Recommendations  None recommended by OT    Recommendations for Other Services       Precautions / Restrictions Precautions Precautions: None Restrictions Weight Bearing Restrictions: No      Mobility Bed Mobility Overal bed mobility: Needs Assistance Bed Mobility: Supine to Sit     Supine to sit: Supervision          Transfers Overall transfer level: Needs assistance   Transfers: Sit to/from Stand Sit  to Stand: Supervision                  Balance Overall balance assessment: Needs assistance   Sitting balance-Leahy Scale: Good     Standing balance support: During functional activity Standing balance-Leahy Scale: Fair                             ADL either performed or assessed with clinical judgement   ADL Overall ADL's : At baseline                                       General ADL Comments: patient was supervision for ADLs with confusion noted. patient had one LOB with min guard to maintain standing balance in hallway. patient endorased being at her baseline for ADLs. patient was able to complete bed mobility, transfers, functional mobility, standing at sink for grooming tasks and LB dressing with sueprvision for safety.     Vision Patient Visual Report: No change from baseline       Perception     Praxis      Pertinent Vitals/Pain Pain Assessment Pain Assessment: No/denies pain     Hand Dominance Right   Extremity/Trunk Assessment Upper Extremity Assessment Upper Extremity Assessment: Overall WFL for tasks assessed   Lower Extremity Assessment Lower Extremity Assessment: Defer to PT evaluation   Cervical / Trunk Assessment Cervical / Trunk Assessment: Normal   Communication Communication Communication: No difficulties   Cognition Arousal/Alertness: Awake/alert Behavior During Therapy: WFL for tasks assessed/performed Overall Cognitive Status: No family/caregiver present to determine baseline cognitive functioning  General Comments: patient was attempting to call brother then attempted to call hospital to see if he was present. patient was noted to have a difficult time word finding with increased confusion     General Comments       Exercises     Shoulder Instructions      Home Living Family/patient expects to be discharged to:: Unsure Living Arrangements:  Alone Available Help at Discharge: Family;Available PRN/intermittently Type of Home: House Home Access: Stairs to enter     Home Layout: One level               Home Equipment: None          Prior Functioning/Environment Prior Level of Function : Independent/Modified Independent               ADLs Comments: patient reported living at house but not sure she wants to go back. patient very confused on this date. no family in room        OT Problem List:        OT Treatment/Interventions:      OT Goals(Current goals can be found in the care plan section) Acute Rehab OT Goals OT Goal Formulation: All assessment and education complete, DC therapy  OT Frequency:      Co-evaluation              AM-PAC OT "6 Clicks" Daily Activity     Outcome Measure Help from another person eating meals?: None Help from another person taking care of personal grooming?: None Help from another person toileting, which includes using toliet, bedpan, or urinal?: A Little Help from another person bathing (including washing, rinsing, drying)?: A Little Help from another person to put on and taking off regular upper body clothing?: None Help from another person to put on and taking off regular lower body clothing?: None 6 Click Score: 22   End of Session Nurse Communication: Other (comment) (ok to participate in session)  Activity Tolerance: Patient tolerated treatment well Patient left: in bed;with call bell/phone within reach;with family/visitor present  OT Visit Diagnosis: Unsteadiness on feet (R26.81)                Time: 9528-4132 OT Time Calculation (min): 16 min Charges:  OT General Charges $OT Visit: 1 Visit OT Evaluation $OT Eval Low Complexity: 1 Low  Paula Dickson OTR/L, MS Acute Rehabilitation Department Office# [phone]   Paula Dickson 08/15/2022, 2:10 PM

## 2022-08-15 NOTE — Progress Notes (Signed)
PROGRESS NOTE Paula Dickson  ZOX:[id number] DOB: 13-Dec-1965 DOA: 08/10/2022 PCP: [name]   Brief Narrative/Hospital Course: 56 y.o. female with medical history significant of BPD, schizophrenia, schizoaffective,  HTN brought to ED by sister, [name], via EMS due to concerns over her behavior.  Sister states that she has been confused this week and not taking her gastric medications.  States that she was recently released from [facility] on 07/28/2022 and since then has not been taking medication.  Also states that she has not been eating or drinking fluids.  States that she goes to see her sister daily as patient lives alone and appears to be getting worse. Found to have sodium of 159 and AKI with creat of 2.0 up from a 1.1 baseline. Patient treated with IV fluids and serial electrolytes monitored with improvement.  At this time electrolytes improving.  Sodium has improved.  Tolerating diet.  Mental status much better.     Subjective:  Seen and examined this morning.  She is resting comfortably alert.  Interactive and appropriately interactive. Has been taking her meds here Overnight afebrile  Assessment and Plan: Principal Problem:   AKI (acute kidney injury) ([place]) Active Problems:   Hypernatremia   Hyperthyroidism   Schizoaffective disorder, bipolar type ([place])   Sinus tachycardia   WJX:BJYNWGNF with poor oral intake in the setting of psychiatric illness.AKI has resolved. Wean off ivf  to 30cc/hr.  Hypernatremia: Sodium improved, decrease IV fluids.   Recent Labs  Lab 08/11/22 1735 08/12/22 0036 08/13/22 0519 08/14/22 0436 08/15/22 0506  NA 147* 148* 148* 148* 144    Schizoaffective disorder, bipolar type Psychosis: Psychiatry following input appreciated. She was IVCd per Dr. Alcario Drought. Back on antipsychotics: Cogentin/haloperidol/Zyprexa and being monitored by psychiatry> as per psych patient is clinically improving and may not need psych hospitalization, was just  hospitalized 2 weeks ago.  Await for final psych recommendation. Patient states she is interested to go to behavioral health  Hyperthyroidism Sinus tachycardia: In the setting of  noncompliance.  Vital stable continue her Tapazole and propanolol.  DVT prophylaxis: enoxaparin (LOVENOX) injection 40 mg Start: 08/12/22 1000 Code Status:   Code Status: Full Code Family Communication: plan of care discussed with patient/RN at bedside. Patient status is: Inpatient because of electrolyte imbalance   Level of care: Telemetry  Dispo: The patient is from: home            Anticipated disposition: Inpatient psychiatry versus home w/ HH.  Continue PT OT.  Mobility Assessment (last 72 hours)     Mobility Assessment     Row Name 08/14/22 1352 08/13/22 1006 08/12/22 2030       Does patient have an order for bedrest or is patient medically unstable -- No - Continue assessment No - Continue assessment     What is the highest level of mobility based on the progressive mobility assessment? Level 5 (Walks with assist in room/hall) - Balance while stepping forward/back and can walk in room with assist - Complete Level 4 (Walks with assist in room) - Balance while marching in place and cannot step forward and back - Complete Level 4 (Walks with assist in room) - Balance while marching in place and cannot step forward and back - Complete               Objective: Vitals last 24 hrs: Vitals:   08/14/22 1337 08/14/22 1600 08/14/22 2032 08/15/22 0450  BP: 118/66 135/83 137/82 113/82  Pulse: 88 73 80 85  Resp: '16  18 16  '$ Temp: 98.5 F (36.9 C) 98.4 F (36.9 C) 98.4 F (36.9 C) 98.3 F (36.8 C)  TempSrc: Oral Oral Oral Oral  SpO2: 98% 100% 97% 91%  Weight:      Height:       Weight change:   Physical Examination: General exam: AAo, older than stated age, weak appearing. HEENT:Oral mucosa moist, Ear/Nose WNL grossly, dentition normal. Respiratory system: bilaterally clear,no use of accessory  muscle Cardiovascular system: S1 & S2 +, No JVD,. Gastrointestinal system: Abdomen soft,NT,ND,BS+ Nervous System:Alert, awake, moving extremities and grossly nonfocal Extremities: LE ankle edema neg, distal peripheral pulses palpable.  Skin: No rashes,no icterus. MSK: Normal muscle bulk,tone, power   Medications reviewed:  Scheduled Meds:  benztropine  0.5 mg Oral BID   enoxaparin (LOVENOX) injection  40 mg Subcutaneous Q24H   haloperidol  5 mg Oral BID   methimazole  10 mg Oral TID   OLANZapine  5 mg Oral QHS   propranolol  10 mg Oral BID   Continuous Infusions:  dextrose 30 mL/hr at 08/15/22 0930   Unresulted Labs (From admission, onward)     Start     Ordered   08/13/22 1696  Basic metabolic panel  Daily at 5am,   R      08/12/22 1045           Data Reviewed: I have personally reviewed following labs and imaging studies CBC: Recent Labs  Lab 08/10/22 1845 08/11/22 0536  WBC 7.8 9.8  NEUTROABS 4.7  --   HGB 16.4* 12.5  HCT 52.4* 39.4  MCV 89.0 88.7  PLT 275 789   Basic Metabolic Panel: Recent Labs  Lab 08/11/22 1735 08/12/22 0036 08/13/22 0519 08/14/22 0436 08/15/22 0506  NA 147* 148* 148* 148* 144  K 3.3* 3.6 3.6 3.7 3.7  CL 119* 120* 115* 112* 107  CO2 '23 24 26 28 29  '$ GLUCOSE 94 101* 100* 104* 107*  BUN 31* 28* 17 27* 16  CREATININE 1.11* 0.98 0.97 1.24* 1.22*  CALCIUM 9.2 9.4 10.0 9.7 9.9   GFR: Estimated Creatinine Clearance: 41.7 mL/min (A) (by C-G formula based on SCr of 1.22 mg/dL (H)). Liver Function Tests: Recent Labs  Lab 08/10/22 1845  AST 21  ALT 16  ALKPHOS 92  BILITOT 1.3*  PROT 8.1  ALBUMIN 4.5    No results for input(s): "TSH", "T4TOTAL", "FREET4", "T3FREE", "THYROIDAB" in the last 72 hours. Antimicrobials: Anti-infectives (From admission, onward)    None      Culture/Microbiology none Radiology Studies: No results found.   LOS: 5 days   Antonieta Pert, MD [facility]  08/15/2022, 12:34 PM

## 2022-08-15 NOTE — Consult Note (Signed)
[facility] Psychiatry Consult   Reason for Consult:  Psychosis Referring Physician:  Dr. Antonieta Pert Patient Identification: Paula Dickson MRN:  [id number] Principal Diagnosis: AKI (acute kidney injury) [facility]) Diagnosis:  Principal Problem:   AKI (acute kidney injury) ([place]) Active Problems:   Hyperthyroidism   Schizoaffective disorder, bipolar type ([place])   Hypernatremia   Sinus tachycardia   Total Time spent with patient: 45 minutes  Subjective:   Paula Dickson is a 56 y.o. female patient with a history of schizoaffective disorder-bipolar type who presents to [facility] due to agressive behavior.  Patient petitioned for IVC by EDP. Per IVC, " aggressive behavior, confusion and psychosis, hallucinating about seeing things on the floor, not taking psychiatric medications and threatening family, not eating or drinking at home and caring for personal hygiene"  Patient was seen in room awake, alert and oriented.  While she is taking her medication, and appears to be improving from initial psychiatric presentation she continues to present with much paranoia, delusions, and psychosis.  This morning she did exhibit some thought blocking, and showed much difficulty concentrating and completing a full sentence.  She also presented with flight of ideas and derailment, as noted by her jumping from 1 topic to another.  While she was unable to stay on task or topic, most of her conversation did revolve around paranoia.  For example, inquired about her stopping her medication after being discharged from [facility]?  Patient states "I did not have any clean water.  My septic tank needed to be cleaned.  We moved from one house to another house, and the man came to get the money for the septic tank but I do not know if he cleaned it.  My water is brown and dirty.  Discussed with patient her water supply i.e. well and septic tank are not the same thing, and her water supply does not come from her  septic tank.  She stated then that her pills are dirty, which she cannot take them because they have brown spots on it.  She is concerned that someone has contaminated her pills, and needs all her medication in the packaging before she takes it.  She does express interest in a group home, as long as she can continue to see her family.  Due to patient's ongoing psychosis, will continue to see inpatient psychiatric hospitalization.  Patient continues to meet criteria for involuntary commitment.  HPI:   On evaluation, patient is alert.  She is disoriented to place time and situation.  She is disheveled.   Patient appears restless during the assessment and changes positions often.Patient is unable to recall why she was brought to the hospital.  When asked about her recent admission to [facility] for psychiatric treatment, she states "I remember, but I went home early.  I am not a [place] friend.  I like smoking."  Patient mostly replies with irrelevant comments.  Several times during the assessment she states "I like Jazz."  She states "he likes to cook."  She was unable to elaborate on who she was referring to.  She made several nonsensical statements.  When asked about AVH, patient states "no" and then make statements about Spider-Man and liking jazz.   Past Psychiatric History:  Schizoaffective disorder, Bipolar type, Generalized anxiety disorder.  Multiple inpatient hospitalizations. Inpatient at [facility] 08/23, [facility] 02/2022, [facility] 03/2022, [facility] 12/202, 09/2020.   Past Medical History:  Past Medical History:  Diagnosis Date  Bipolar affective disorder (HCC)    Bipolar disorder ([place])    History of arthritis    History of chicken pox    History of depression    History of genital warts    history of heart murmur    History of high blood pressure    History of thyroid disease    History of UTI    Hypertension    Low TSH level 07/13/2017   Schizophrenia ([place])      Past Surgical History:  Procedure Laterality Date   ABLATION ON ENDOMETRIOSIS     CYST REMOVAL NECK     around 11 years ago /benign   MULTIPLE TOOTH EXTRACTIONS     Family History:  Family History  Problem Relation Age of Onset   Arthritis Father    Hyperlipidemia Father    High blood pressure Father    Diabetes Sister    Diabetes Mother    Diabetes Brother    Mental illness Brother    Alcohol abuse Paternal Uncle    Alcohol abuse Paternal Grandfather    Breast cancer Maternal Aunt    Breast cancer Paternal Aunt    High blood pressure Sister    Mental illness Other        runs in family   Family Psychiatric  History:  Social History:  Social History   Substance and Sexual Activity  Alcohol Use Not Currently     Social History   Substance and Sexual Activity  Drug Use Not Currently    Social History   Socioeconomic History   Marital status: Single    Spouse name: Not on file   Number of children: Not on file   Years of education: Not on file   Highest education level: Patient refused  Occupational History   Not on file  Tobacco Use   Smoking status: Never   Smokeless tobacco: Never  Vaping Use   Vaping Use: Never used  Substance and Sexual Activity   Alcohol use: Not Currently   Drug use: Not Currently   Sexual activity: Not Currently  Other Topics Concern   Not on file  Social History Narrative   ** Merged History Encounter **       Social Determinants of Health   Financial Resource Strain: Not on file  Food Insecurity: Not on file  Transportation Needs: Not on file  Physical Activity: Not on file  Stress: Not on file  Social Connections: Not on file   Additional Social History:    Allergies:  No Known Allergies  Labs:  Results for orders placed or performed during the hospital encounter of 08/10/22 (from the past 48 hour(s))  Basic metabolic panel     Status: Abnormal   Collection Time: 08/14/22  4:36 AM  Result Value Ref Range    Sodium 148 (H) 135 - 145 mmol/L   Potassium 3.7 3.5 - 5.1 mmol/L   Chloride 112 (H) 98 - 111 mmol/L   CO2 28 22 - 32 mmol/L   Glucose, Bld 104 (H) 70 - 99 mg/dL    Comment: Glucose reference range applies only to samples taken after fasting for at least 8 hours.   BUN 27 (H) 6 - 20 mg/dL   Creatinine, Ser 1.24 (H) 0.44 - 1.00 mg/dL   Calcium 9.7 8.9 - 10.3 mg/dL   GFR, Estimated 50 (L) >60 mL/min    Comment: (NOTE) Calculated using the CKD-EPI Creatinine Equation (2021)    Anion gap 8 5 -  15    Comment: Performed at [facility], [place] [address], [place], [place] [address]  Basic metabolic panel     Status: Abnormal   Collection Time: 08/15/22  5:06 AM  Result Value Ref Range   Sodium 144 135 - 145 mmol/L   Potassium 3.7 3.5 - 5.1 mmol/L   Chloride 107 98 - 111 mmol/L   CO2 29 22 - 32 mmol/L   Glucose, Bld 107 (H) 70 - 99 mg/dL    Comment: Glucose reference range applies only to samples taken after fasting for at least 8 hours.   BUN 16 6 - 20 mg/dL   Creatinine, Ser 1.22 (H) 0.44 - 1.00 mg/dL   Calcium 9.9 8.9 - 10.3 mg/dL   GFR, Estimated 51 (L) >60 mL/min    Comment: (NOTE) Calculated using the CKD-EPI Creatinine Equation (2021)    Anion gap 8 5 - 15    Comment: Performed at [facility], [place] [address] [place], [place] [address]    Current Facility-Administered Medications  Medication Dose Route Frequency Provider Last Rate Last Admin   acetaminophen (TYLENOL) tablet 650 mg  650 mg Oral Q6H PRN Etta Quill, DO       Or   acetaminophen (TYLENOL) suppository 650 mg  650 mg Rectal Q6H PRN Etta Quill, DO       benztropine (COGENTIN) tablet 0.5 mg  0.5 mg Oral BID Suella Broad, FNP   0.5 mg at 08/15/22 1008   dextrose 5 % solution   Intravenous Continuous Kc, Ramesh, MD 30 mL/hr at 08/15/22 0930 Rate Change at 08/15/22 0930   enoxaparin (LOVENOX) injection 40 mg  40 mg Subcutaneous Q24H Vann, Jessica U, DO   40 mg at  08/15/22 1008   haloperidol (HALDOL) tablet 5 mg  5 mg Oral BID Suella Broad, FNP   5 mg at 08/15/22 1008   LORazepam (ATIVAN) injection 1-2 mg  1-2 mg Intravenous Q4H PRN Etta Quill, DO   1 mg at 08/14/22 2309   methimazole (TAPAZOLE) tablet 10 mg  10 mg Oral TID Etta Quill, DO   10 mg at 08/15/22 1007   OLANZapine (ZYPREXA) tablet 5 mg  5 mg Oral QHS Suella Broad, FNP   5 mg at 08/14/22 2114   ondansetron (ZOFRAN) tablet 4 mg  4 mg Oral Q6H PRN Etta Quill, DO       Or   ondansetron [facility]) injection 4 mg  4 mg Intravenous Q6H PRN Etta Quill, DO       propranolol (INDERAL) tablet 10 mg  10 mg Oral BID Jennette Kettle M, DO   10 mg at 08/15/22 1008    Musculoskeletal: Strength & Muscle Tone: within normal limits Gait & Station: normal Patient leans: N/A  Psychiatric Specialty Exam:  Presentation  General Appearance: Casual; Appropriate for Environment  Eye Contact:Good  Speech:Clear and Coherent; Normal Rate  Speech Volume:Normal  Handedness:Right   Mood and Affect  Mood:Euthymic  Affect:Blunt   Thought Process  Thought Processes:Irrevelant; Disorganized  Descriptions of Associations:Loose  Orientation:Full (Time, Place and Person)  Thought Content:Illusions; Paranoid Ideation; Perseveration; Scattered  History of Schizophrenia/Schizoaffective disorder:Yes  Duration of Psychotic Symptoms:Greater than six months  Hallucinations:Hallucinations: None  Ideas of Reference:Paranoia; Percusatory; Delusions  Suicidal Thoughts:Suicidal Thoughts: No  Homicidal Thoughts:Homicidal Thoughts: No   Sensorium  Memory:Immediate Fair; Recent Poor; Remote Poor  Judgment:Impaired  Insight:Poor   Executive Functions  Concentration:Poor  Attention Span:Poor  Recall:Poor  Fund of Knowledge:Poor  Language:Poor   Psychomotor Activity  Psychomotor Activity:Psychomotor Activity: Normal   Assets  Assets:Financial  Resources/Insurance  Sleep  Sleep:Sleep: Fair  Physical Exam: Physical Exam Vitals and nursing note reviewed.  Constitutional:      Appearance: Normal appearance. She is normal weight.  Eyes:     Pupils: Pupils are equal, round, and reactive to light.  Skin:    General: Skin is warm.     Capillary Refill: Capillary refill takes less than 2 seconds.  Neurological:     General: No focal deficit present.     Mental Status: She is alert and oriented to person, place, and time. Mental status is at baseline.  Psychiatric:        Mood and Affect: Mood normal.        Behavior: Behavior normal.        Thought Content: Thought content normal.        Judgment: Judgment normal.    ROS Blood pressure (!) 138/59, pulse 74, temperature 98.3 F (36.8 C), temperature source Oral, resp. rate 20, height '5\' 1"'$  (1.549 m), weight 59.7 kg, SpO2 100 %. Body mass index is 24.87 kg/m.  Treatment Plan Summary: Daily contact with patient to assess and evaluate symptoms and progress in treatment, Medication management, and Plan   -Continue 1:1 sitter for safety -To prevent further decompensation of schizophrenia and bipolar disorder, continue Haldol as prescribed and resume Olanzapine  once sodium has returned to normal limits.  -Recommend treating hyperthyroidism thyrotoxicosis, as this can also be contributing to her worsening psychosis, delirium, rebound mania.  In addition to the patient has history of severe hypernatremia when acutely psychotic, likely due to poor oral intake and diminish thirst mechanism. -Psychiatry consult service will continue to follow at this time.   -Final disposition is pending as patient may not require another inpatient psychiatric hospitalization considering she was just released less than 2 weeks ago and seems to be getting better after compliance with medications at the hospital  Disposition: Recommend psychiatric Inpatient admission when medically cleared.  Suella Broad, FNP 08/15/2022 1:34 PM

## 2022-08-15 NOTE — Care Management Important Message (Signed)
Important Message  Patient Details IM Letter given to the Patient. Name: Paula Dickson MRN: [id number] Date of Birth: 1966/05/06   Medicare Important Message Given:  Yes     Kerin Salen 08/15/2022, 11:34 AM

## 2022-08-16 DIAGNOSIS — N179 Acute kidney failure, unspecified: Secondary | ICD-10-CM | POA: Diagnosis not present

## 2022-08-16 LAB — BASIC METABOLIC PANEL
Anion gap: 9 (ref 5–15)
BUN: 17 mg/dL (ref 6–20)
CO2: 30 mmol/L (ref 22–32)
Calcium: 10.3 mg/dL (ref 8.9–10.3)
Chloride: 106 mmol/L (ref 98–111)
Creatinine, Ser: 1.17 mg/dL — ABNORMAL HIGH (ref 0.44–1.00)
GFR, Estimated: 54 mL/min — ABNORMAL LOW (ref >60)
Glucose, Bld: 115 mg/dL — ABNORMAL HIGH (ref 70–99)
Potassium: 3.8 mmol/L (ref 3.5–5.1)
Sodium: 145 mmol/L (ref 135–145)

## 2022-08-16 MED ORDER — METHIMAZOLE 10 MG PO TABS
10.0000 mg | ORAL_TABLET | Freq: Every day | ORAL | Status: DC
  Administered 2022-08-16 – 2022-08-17 (×2): 10 mg via ORAL
  Filled 2022-08-16 (×2): qty 1

## 2022-08-16 MED ORDER — HALOPERIDOL 5 MG PO TABS: 5.0000 mg | ORAL_TABLET | Freq: Two times a day (BID) | ORAL | Status: DC

## 2022-08-16 MED ORDER — OLANZAPINE 5 MG PO TABS: 5.0000 mg | ORAL_TABLET | Freq: Every day | ORAL | Status: DC

## 2022-08-16 MED ORDER — PROPRANOLOL HCL 10 MG PO TABS: 10.0000 mg | ORAL_TABLET | Freq: Two times a day (BID) | ORAL | Status: AC

## 2022-08-16 NOTE — Progress Notes (Signed)
Mobility Specialist - Progress Note   08/16/22 0957  Mobility  HOB Elevated/Bed Position Self regulated  Activity Ambulated independently in hallway  Range of Motion/Exercises Active  Level of Assistance Independent  Assistive Device None  Distance Ambulated (ft) 500 ft  Activity Response Tolerated well  Transport method Ambulatory  $Mobility charge 1 Mobility   Pt received in bed and agreeable to mobility. No complaints during ambulation. Pt to bed at EOS with NT in room & all necessities in reach.   [facility]

## 2022-08-16 NOTE — TOC Progression Note (Addendum)
Transition of Care [facility]) - Progression Note    Patient Details  Name: Paula Dickson MRN: [id number] Date of Birth: 17-May-1966  [facility] CM/SW Contact  Jai Bear, Juliann Pulse, RN Phone Number: 08/16/2022, 11:05 AM  Clinical Narrative: Will fax out for gero psych facility placement-await responses.  -11:47a-Received response from [facility]-bed available-awaiting d/c summary.MD notified.  -12:14p-[facility] received d/c summary-Nsg calling report to [phone]-will confirm when ready for transport by [facility] to [place] [address]. -2:26p [facility]-IP Psych-Sheriff-out of county already called for early am p/u.    Expected Discharge Plan: Psychiatric Hospital Barriers to Discharge: Continued Medical Work up  Expected Discharge Plan and Services Expected Discharge Plan: [facility]                                               Social Determinants of Health (SDOH) Interventions    Readmission Risk Interventions     No data to display

## 2022-08-16 NOTE — Progress Notes (Signed)
Patient discharging to [facility].  Report called to Janett Billow, RN at facility.  AVS and meds reviewed - placed in packet.  IV removed - WNL.  Patient aware of DC plan, patient in NAD at this time.  Awaiting arrival of transportation.

## 2022-08-16 NOTE — Discharge Summary (Addendum)
Physician Discharge Summary  Paula Dickson KZS:[id number] DOB: 08/26/1966 DOA: 08/10/2022  PCP: [name]  Admit date: 08/10/2022 Discharge date: 08/16/2022 Recommendations for Outpatient Follow-up:  Follow up with PCP in 1 weeks-call for appointment Please obtain BMP/CBC in one week  Discharge Dispo: Psychiatric unit with IVC Discharge Condition: Stable Code Status:   Code Status: Full Code Diet recommendation:  Diet Order             Diet Heart Room service appropriate? Yes; Fluid consistency: Thin  Diet effective now                    Brief/Interim Summary: 56 y.o. female with medical history significant of BPD, schizophrenia, schizoaffective,  HTN brought to ED by sister, [name], via EMS due to concerns over her behavior.  Sister states that she has been confused this week and not taking her gastric medications.  States that she was recently released from [facility] on 07/28/2022 and since then has not been taking medication.  Also states that she has not been eating or drinking fluids.  States that she goes to see her sister daily as patient lives alone and appears to be getting worse. Found to have sodium of 159 and AKI with creat of 2.0 up from a 1.1 baseline.Patient treated with IV fluids and serial electrolytes monitored with improvement.  At this time electrolytes improving.  Sodium has improved.  Tolerating diet.  Mental status improving patient continues to need inpatient psychiatric unit and is being discharged care in medically stable condition.   Discharge Diagnoses:  Principal Problem:   AKI (acute kidney injury) ([place]) Active Problems:   Hypernatremia   Hyperthyroidism   Schizoaffective disorder, bipolar type ([place])   Sinus tachycardia  DDU:KGURKYHC with poor oral intake in the setting of psychiatric illness.AKI has resolved. Wean off ivf  to 30cc/hr. Hypernatremia: Sodium improved, off IV fluids tolerating p.o.  Schizoaffective disorder, bipolar  type Psychosis: Psychiatry following input appreciated. She was IVCd per Dr. Alcario Drought. Back on antipsychotics: Cogentin/haloperidol/Zyprexa and being monitored by psychiatry> as per psych patient is clinically improving and may not need psych hospitalization, was just hospitalized 2 weeks ago.  She is being discharged to inpatient psychiatric unit as there is a bed today Patient states she is interested to go to behavioral health   Hyperthyroidism Sinus tachycardia: In the setting of  noncompliance.  Vital stable continue her Tapazole and propanolol at home dose.  Consults: Psychiatric  Subjective: Alert awake oriented resting comfortably  Discharge Exam: Vitals:   08/15/22 2116 08/16/22 0447  BP: 132/82 129/88  Pulse: 90 94  Resp: 20 20  Temp: 99.2 F (37.3 C) 98.3 F (36.8 C)  SpO2: 98% 99%   General: Pt is alert, awake, not in acute distress Cardiovascular: RRR, S1/S2 +, no rubs, no gallops Respiratory: CTA bilaterally, no wheezing, no rhonchi Abdominal: Soft, NT, ND, bowel sounds + Extremities: no edema, no cyanosis  Discharge Instructions  Discharge Instructions     Discharge instructions   Complete by: As directed    Please call call MD or return to ER for similar or worsening recurring problem that brought you to hospital or if any fever,nausea/vomiting,abdominal pain, uncontrolled pain, chest pain,  shortness of breath or any other alarming symptoms.  Please follow-up your doctor as instructed in a week time and call the office for appointment.  Please avoid alcohol, smoking, or any other illicit substance and maintain healthy habits including taking your regular medications as prescribed.  You were cared for by a hospitalist during your hospital stay. If you have any questions about your discharge medications or the care you received while you were in the hospital after you are discharged, you can call the unit and ask to speak with the hospitalist on call if the  hospitalist that took care of you is not available.  Once you are discharged, your primary care physician will handle any further medical issues. Please note that NO REFILLS for any discharge medications will be authorized once you are discharged, as it is imperative that you return to your primary care physician (or establish a relationship with a primary care physician if you do not have one) for your aftercare needs so that they can reassess your need for medications and monitor your lab values   Increase activity slowly   Complete by: As directed       Allergies as of 08/16/2022   No Known Allergies      Medication List     TAKE these medications    benztropine 0.5 MG tablet Commonly known as: COGENTIN Take 0.5 mg by mouth 2 (two) times daily.   haloperidol 5 MG tablet Commonly known as: HALDOL Take 1 tablet (5 mg total) by mouth 2 (two) times daily. What changed:  when to take this Another medication with the same name was removed. Continue taking this medication, and follow the directions you see here.   LORazepam 1 MG tablet Commonly known as: ATIVAN Take 2 mg by mouth every 8 (eight) hours as needed for anxiety.   melatonin 3 MG Tabs tablet Take 6 mg by mouth at bedtime.   methimazole 10 MG tablet Commonly known as: TAPAZOLE Take 10 mg by mouth daily.   OLANZapine 5 MG tablet Commonly known as: ZYPREXA Take 1 tablet (5 mg total) by mouth at bedtime.   propranolol 10 MG tablet Commonly known as: INDERAL Take 1 tablet (10 mg total) by mouth 2 (two) times daily.        Follow-up Information     [name] Follow up in 1 week(s).   Contact information: [place]  [place] [address] [phone]                No Known Allergies  The results of significant diagnostics from this hospitalization (including imaging, microbiology, ancillary and laboratory) are listed below for reference.    Microbiology: Recent Results (from the  past 240 hour(s))  Resp Panel by RT-PCR (Flu A&B, Covid) Anterior Nasal Swab     Status: None   Collection Time: 08/10/22  4:21 PM   Specimen: Anterior Nasal Swab  Result Value Ref Range Status   SARS Coronavirus 2 by RT PCR NEGATIVE NEGATIVE Final    Comment: (NOTE) SARS-CoV-2 target nucleic acids are NOT DETECTED.  The SARS-CoV-2 RNA is generally detectable in upper respiratory specimens during the acute phase of infection. The lowest concentration of SARS-CoV-2 viral copies this assay can detect is 138 copies/mL. A negative result does not preclude SARS-Cov-2 infection and should not be used as the sole basis for treatment or other patient management decisions. A negative result may occur with  improper specimen collection/handling, submission of specimen other than nasopharyngeal swab, presence of viral mutation(s) within the areas targeted by this assay, and inadequate number of viral copies(<138 copies/mL). A negative result must be combined with clinical observations, patient history, and epidemiological information. The expected result is Negative.  Fact Sheet for Patients:  EntrepreneurPulse.com.au  Fact Sheet for Healthcare Providers:  IncredibleEmployment.be  This test is no t yet approved or cleared by the [place] FDA and  has been authorized for detection and/or diagnosis of SARS-CoV-2 by FDA under an Emergency Use Authorization (EUA). This EUA will remain  in effect (meaning this test can be used) for the duration of the COVID-19 declaration under Section 564(b)(1) of the Act, 21 U.S.C.section 360bbb-3(b)(1), unless the authorization is terminated  or revoked sooner.       Influenza A by PCR NEGATIVE NEGATIVE Final   Influenza B by PCR NEGATIVE NEGATIVE Final    Comment: (NOTE) The Xpert Xpress SARS-CoV-2/FLU/RSV plus assay is intended as an aid in the diagnosis of influenza from Nasopharyngeal swab specimens and should  not be used as a sole basis for treatment. Nasal washings and aspirates are unacceptable for Xpert Xpress SARS-CoV-2/FLU/RSV testing.  Fact Sheet for Patients: EntrepreneurPulse.com.au  Fact Sheet for Healthcare Providers: IncredibleEmployment.be  This test is not yet approved or cleared by the [place] FDA and has been authorized for detection and/or diagnosis of SARS-CoV-2 by FDA under an Emergency Use Authorization (EUA). This EUA will remain in effect (meaning this test can be used) for the duration of the COVID-19 declaration under Section 564(b)(1) of the Act, 21 U.S.C. section 360bbb-3(b)(1), unless the authorization is terminated or revoked.  Performed at [facility], [place] [address], [place], [place] [address]     Procedures/Studies: No results found.  Labs: BNP (last 3 results) Recent Labs    12/05/21 1831  BNP 8.6   Basic Metabolic Panel: Recent Labs  Lab 08/12/22 0036 08/13/22 0519 08/14/22 0436 08/15/22 0506 08/16/22 0509  NA 148* 148* 148* 144 145  K 3.6 3.6 3.7 3.7 3.8  CL 120* 115* 112* 107 106  CO2 '24 26 28 29 30  '$ GLUCOSE 101* 100* 104* 107* 115*  BUN 28* 17 27* 16 17  CREATININE 0.98 0.97 1.24* 1.22* 1.17*  CALCIUM 9.4 10.0 9.7 9.9 10.3   Liver Function Tests: Recent Labs  Lab 08/10/22 1845  AST 21  ALT 16  ALKPHOS 92  BILITOT 1.3*  PROT 8.1  ALBUMIN 4.5   No results for input(s): "LIPASE", "AMYLASE" in the last 168 hours. No results for input(s): "AMMONIA" in the last 168 hours. CBC: Recent Labs  Lab 08/10/22 1845 08/11/22 0536  WBC 7.8 9.8  NEUTROABS 4.7  --   HGB 16.4* 12.5  HCT 52.4* 39.4  MCV 89.0 88.7  PLT 275 189   Cardiac Enzymes: No results for input(s): "CKTOTAL", "CKMB", "CKMBINDEX", "TROPONINI" in the last 168 hours. BNP: Invalid input(s): "POCBNP" CBG: No results for input(s): "GLUCAP" in the last 168 hours. D-Dimer No results for input(s):  "DDIMER" in the last 72 hours. Hgb A1c No results for input(s): "HGBA1C" in the last 72 hours. Lipid Profile No results for input(s): "CHOL", "HDL", "LDLCALC", "TRIG", "CHOLHDL", "LDLDIRECT" in the last 72 hours. Thyroid function studies No results for input(s): "TSH", "T4TOTAL", "T3FREE", "THYROIDAB" in the last 72 hours.  Invalid input(s): "FREET3" Anemia work up No results for input(s): "VITAMINB12", "FOLATE", "FERRITIN", "TIBC", "IRON", "RETICCTPCT" in the last 72 hours. Urinalysis    Component Value Date/Time   COLORURINE STRAW (A) 08/11/2022 1435   APPEARANCEUR CLEAR 08/11/2022 1435   LABSPEC 1.003 (L) 08/11/2022 1435   PHURINE 6.0 08/11/2022 1435   GLUCOSEU NEGATIVE 08/11/2022 1435   HGBUR NEGATIVE 08/11/2022 1435   BILIRUBINUR NEGATIVE 08/11/2022 1435   BILIRUBINUR neg 12/27/2015 1421  KETONESUR NEGATIVE 08/11/2022 1435   PROTEINUR NEGATIVE 08/11/2022 1435   UROBILINOGEN 0.2 12/27/2015 1421   UROBILINOGEN 0.2 06/14/2015 0040   NITRITE NEGATIVE 08/11/2022 1435   LEUKOCYTESUR NEGATIVE 08/11/2022 1435   Sepsis Labs Recent Labs  Lab 08/10/22 1845 08/11/22 0536  WBC 7.8 9.8   Microbiology Recent Results (from the past 240 hour(s))  Resp Panel by RT-PCR (Flu A&B, Covid) Anterior Nasal Swab     Status: None   Collection Time: 08/10/22  4:21 PM   Specimen: Anterior Nasal Swab  Result Value Ref Range Status   SARS Coronavirus 2 by RT PCR NEGATIVE NEGATIVE Final    Comment: (NOTE) SARS-CoV-2 target nucleic acids are NOT DETECTED.  The SARS-CoV-2 RNA is generally detectable in upper respiratory specimens during the acute phase of infection. The lowest concentration of SARS-CoV-2 viral copies this assay can detect is 138 copies/mL. A negative result does not preclude SARS-Cov-2 infection and should not be used as the sole basis for treatment or other patient management decisions. A negative result may occur with  improper specimen collection/handling, submission of  specimen other than nasopharyngeal swab, presence of viral mutation(s) within the areas targeted by this assay, and inadequate number of viral copies(<138 copies/mL). A negative result must be combined with clinical observations, patient history, and epidemiological information. The expected result is Negative.  Fact Sheet for Patients:  EntrepreneurPulse.com.au  Fact Sheet for Healthcare Providers:  IncredibleEmployment.be  This test is no t yet approved or cleared by the [place] FDA and  has been authorized for detection and/or diagnosis of SARS-CoV-2 by FDA under an Emergency Use Authorization (EUA). This EUA will remain  in effect (meaning this test can be used) for the duration of the COVID-19 declaration under Section 564(b)(1) of the Act, 21 U.S.C.section 360bbb-3(b)(1), unless the authorization is terminated  or revoked sooner.       Influenza A by PCR NEGATIVE NEGATIVE Final   Influenza B by PCR NEGATIVE NEGATIVE Final    Comment: (NOTE) The Xpert Xpress SARS-CoV-2/FLU/RSV plus assay is intended as an aid in the diagnosis of influenza from Nasopharyngeal swab specimens and should not be used as a sole basis for treatment. Nasal washings and aspirates are unacceptable for Xpert Xpress SARS-CoV-2/FLU/RSV testing.  Fact Sheet for Patients: EntrepreneurPulse.com.au  Fact Sheet for Healthcare Providers: IncredibleEmployment.be  This test is not yet approved or cleared by the [place] FDA and has been authorized for detection and/or diagnosis of SARS-CoV-2 by FDA under an Emergency Use Authorization (EUA). This EUA will remain in effect (meaning this test can be used) for the duration of the COVID-19 declaration under Section 564(b)(1) of the Act, 21 U.S.C. section 360bbb-3(b)(1), unless the authorization is terminated or revoked.  Performed at [facility], [place]  [address] [place], [place] [address]      Time coordinating discharge: 25 minutes  SIGNED: Antonieta Pert, MD  [facility] 08/16/2022, 12:35 PM  If 7PM-7AM, please contact night-coverage [email or web address]

## 2022-08-17 NOTE — Progress Notes (Signed)
Sheriff has picked up patient. Receiving facility aware of patient arrival today.

## 2022-09-21 ENCOUNTER — Other Ambulatory Visit: Payer: Self-pay

## 2022-09-21 ENCOUNTER — Encounter (HOSPITAL_COMMUNITY): Payer: Self-pay | Admitting: *Deleted

## 2022-09-21 ENCOUNTER — Emergency Department (HOSPITAL_COMMUNITY)
Admission: EM | Admit: 2022-09-21 | Discharge: 2022-09-22 | Disposition: A | Discharge status: Other Acute Inpatient Hospital | Payer: Medicare Other | Attending: Emergency Medicine | Admitting: Emergency Medicine

## 2022-09-21 DIAGNOSIS — Z1152 Encounter for screening for COVID-19: Secondary | ICD-10-CM | POA: Insufficient documentation

## 2022-09-21 DIAGNOSIS — R443 Hallucinations, unspecified: Secondary | ICD-10-CM | POA: Insufficient documentation

## 2022-09-21 DIAGNOSIS — Z79899 Other long term (current) drug therapy: Secondary | ICD-10-CM | POA: Diagnosis not present

## 2022-09-21 DIAGNOSIS — I10 Essential (primary) hypertension: Secondary | ICD-10-CM | POA: Diagnosis not present

## 2022-09-21 DIAGNOSIS — F250 Schizoaffective disorder, bipolar type: Secondary | ICD-10-CM | POA: Diagnosis present

## 2022-09-21 DIAGNOSIS — F209 Schizophrenia, unspecified: Secondary | ICD-10-CM

## 2022-09-21 LAB — CBC WITH DIFFERENTIAL/PLATELET
Abs Immature Granulocytes: 0.01 10*3/uL (ref 0.00–0.07)
Basophils Absolute: 0 10*3/uL (ref 0.0–0.1)
Basophils Relative: 1 %
Eosinophils Absolute: 0.1 10*3/uL (ref 0.0–0.5)
Eosinophils Relative: 2 %
HCT: 34.1 % — ABNORMAL LOW (ref 36.0–46.0)
Hemoglobin: 10.8 g/dL — ABNORMAL LOW (ref 12.0–15.0)
Immature Granulocytes: 0 %
Lymphocytes Relative: 40 %
Lymphs Abs: 2.4 10*3/uL (ref 0.7–4.0)
MCH: 28 pg (ref 26.0–34.0)
MCHC: 31.7 g/dL (ref 30.0–36.0)
MCV: 88.3 fL (ref 80.0–100.0)
Monocytes Absolute: 0.6 10*3/uL (ref 0.1–1.0)
Monocytes Relative: 10 %
Neutro Abs: 2.8 10*3/uL (ref 1.7–7.7)
Neutrophils Relative %: 47 %
Platelets: 246 10*3/uL (ref 150–400)
RBC: 3.86 MIL/uL — ABNORMAL LOW (ref 3.87–5.11)
RDW: 14.9 % (ref 11.5–15.5)
WBC: 5.9 10*3/uL (ref 4.0–10.5)
nRBC: 0 % (ref 0.0–0.2)

## 2022-09-21 LAB — RAPID URINE DRUG SCREEN, HOSP PERFORMED
Amphetamines: NOT DETECTED
Barbiturates: NOT DETECTED
Benzodiazepines: NOT DETECTED
Cocaine: NOT DETECTED
Opiates: NOT DETECTED
Tetrahydrocannabinol: NOT DETECTED

## 2022-09-21 LAB — URINALYSIS, ROUTINE W REFLEX MICROSCOPIC
Bilirubin Urine: NEGATIVE
Glucose, UA: NEGATIVE mg/dL
Hgb urine dipstick: NEGATIVE
Ketones, ur: NEGATIVE mg/dL
Leukocytes,Ua: NEGATIVE
Nitrite: NEGATIVE
Protein, ur: NEGATIVE mg/dL
Specific Gravity, Urine: 1.012 (ref 1.005–1.030)
pH: 5 (ref 5.0–8.0)

## 2022-09-21 LAB — RESP PANEL BY RT-PCR (FLU A&B, COVID) ARPGX2
Influenza A by PCR: NEGATIVE
Influenza B by PCR: NEGATIVE
SARS Coronavirus 2 by RT PCR: NEGATIVE

## 2022-09-21 LAB — BASIC METABOLIC PANEL
Anion gap: 7 (ref 5–15)
BUN: 22 mg/dL — ABNORMAL HIGH (ref 6–20)
CO2: 24 mmol/L (ref 22–32)
Calcium: 9.7 mg/dL (ref 8.9–10.3)
Chloride: 109 mmol/L (ref 98–111)
Creatinine, Ser: 1.19 mg/dL — ABNORMAL HIGH (ref 0.44–1.00)
GFR, Estimated: 53 mL/min — ABNORMAL LOW (ref >60)
Glucose, Bld: 129 mg/dL — ABNORMAL HIGH (ref 70–99)
Potassium: 3.8 mmol/L (ref 3.5–5.1)
Sodium: 140 mmol/L (ref 135–145)

## 2022-09-21 LAB — TSH: TSH: 2.722 u[IU]/mL (ref 0.350–4.500)

## 2022-09-21 LAB — T4, FREE: Free T4: 0.71 ng/dL (ref 0.61–1.12)

## 2022-09-21 LAB — SALICYLATE LEVEL: Salicylate Lvl: <7 mg/dL — ABNORMAL LOW (ref 7.0–30.0)

## 2022-09-21 LAB — ETHANOL: Alcohol, Ethyl (B): <10 mg/dL (ref <10)

## 2022-09-21 LAB — ACETAMINOPHEN LEVEL: Acetaminophen (Tylenol), Serum: <10 ug/mL — ABNORMAL LOW (ref 10–30)

## 2022-09-21 MED ORDER — BENZTROPINE MESYLATE 0.5 MG PO TABS
0.5000 mg | ORAL_TABLET | Freq: Two times a day (BID) | ORAL | Status: DC
  Administered 2022-09-21: 0.5 mg via ORAL
  Filled 2022-09-21: qty 1

## 2022-09-21 MED ORDER — PROPRANOLOL HCL 20 MG PO TABS
10.0000 mg | ORAL_TABLET | Freq: Two times a day (BID) | ORAL | Status: DC
  Administered 2022-09-21: 10 mg via ORAL
  Filled 2022-09-21: qty 1

## 2022-09-21 MED ORDER — HALOPERIDOL 5 MG PO TABS
5.0000 mg | ORAL_TABLET | Freq: Two times a day (BID) | ORAL | Status: DC
  Administered 2022-09-21: 5 mg via ORAL
  Filled 2022-09-21: qty 1

## 2022-09-21 MED ORDER — MELATONIN 3 MG PO TABS
6.0000 mg | ORAL_TABLET | Freq: Every day | ORAL | Status: DC
  Administered 2022-09-21: 6 mg via ORAL
  Filled 2022-09-21: qty 2

## 2022-09-21 MED ORDER — STERILE WATER FOR INJECTION IJ SOLN
INTRAMUSCULAR | Status: AC
  Administered 2022-09-21: 1 mL
  Filled 2022-09-21: qty 10

## 2022-09-21 MED ORDER — ZIPRASIDONE MESYLATE 20 MG IM SOLR
20.0000 mg | INTRAMUSCULAR | Status: AC | PRN
  Administered 2022-09-21: 20 mg via INTRAMUSCULAR
  Filled 2022-09-21: qty 20

## 2022-09-21 MED ORDER — LORAZEPAM 1 MG PO TABS
2.0000 mg | ORAL_TABLET | Freq: Three times a day (TID) | ORAL | Status: DC | PRN
  Administered 2022-09-21: 2 mg via ORAL
  Filled 2022-09-21: qty 2

## 2022-09-21 MED ORDER — HALOPERIDOL LACTATE 5 MG/ML IJ SOLN
2.0000 mg | Freq: Once | INTRAMUSCULAR | Status: AC
  Administered 2022-09-21: 2 mg via INTRAVENOUS
  Filled 2022-09-21: qty 1

## 2022-09-21 MED ORDER — METHIMAZOLE 10 MG PO TABS
10.0000 mg | ORAL_TABLET | Freq: Every day | ORAL | Status: DC
  Administered 2022-09-21: 10 mg via ORAL
  Filled 2022-09-21 (×2): qty 1

## 2022-09-21 MED ORDER — OLANZAPINE 5 MG PO TABS
5.0000 mg | ORAL_TABLET | Freq: Every day | ORAL | Status: DC
  Administered 2022-09-21: 5 mg via ORAL
  Filled 2022-09-21: qty 1

## 2022-09-21 NOTE — ED Notes (Signed)
Lab called to add on UA to urine previously sent for UDS.

## 2022-09-21 NOTE — Progress Notes (Addendum)
Pt was accepted to [facility] 09/21/22 PENDING Negative COVID-19  Pt meets inpatient criteria per Lindon Romp, NP  Attending Physician will be Dr. Jonelle Sports  Report can be called to: - [phone] (This is ia pager, please leave you phone number to receive a call back)  Pt can arrive after 8:00am   Care Team notified: Lindon Romp, NP, and [facility], NT. CSW requested NT to add RN to communication. ..  Tora Kindred, RN who is the RN was updated.  Nadara Mode, [place] 09/21/2022 @ 8:50 PM

## 2022-09-21 NOTE — ED Provider Triage Note (Signed)
Emergency Medicine Provider Triage Evaluation Note  Paula Dickson , a 56 y.o. female  was evaluated in triage.  Patient presents with sister from [place] office for further evaluation.  Discharged last week.  Noncompliant with medications.  She has pressured speech on exam and is unable to express her thoughts clearly.  She does endorse auditory hallucinations.  Admits to aggressive behavior towards her sister.  Denies SI or visual hallucinations..  Review of Systems  Positive: As above Negative: As above  Physical Exam  BP 128/80   Pulse 74   Temp (!) 97.5 F (36.4 C) (Oral)   Resp 18   Ht '5\' 1"'$  (1.549 m)   SpO2 100%   BMI 24.87 kg/m  Gen:   Awake, no distress   Resp:  Normal effort  MSK:   Moves extremities without difficulty  Other:    Medical Decision Making  Medically screening exam initiated at 12:42 PM.  Appropriate orders placed.  Paula Dickson was informed that the remainder of the evaluation will be completed by another provider, this initial triage assessment does not replace that evaluation, and the importance of remaining in the ED until their evaluation is complete.     Evlyn Courier, PA-C 09/21/22 1243

## 2022-09-21 NOTE — ED Triage Notes (Signed)
Pt presents with sister from [place], unable to determine why she is here. Sister states she in not right and not taking her medication.

## 2022-09-21 NOTE — Progress Notes (Signed)
Inpatient Behavioral Health Placement  Pt meets inpatient criteria per Lindon Romp, NP. There are no beds at [facility] per [facility].  Referral was sent to the following facilities;    Destination Service Provider Address Phone Fax  [facility]  [address] [place] [address] [phone]  [facility]  [address], [place] [address] [place]  [facility]  [address] [place] [address] [phone] [place]  [place], [place] [address] [phone]  [facility]  [address] [place], [place] [address] [phone] [place] [place] [address] [place]  [facility]  [address], [place] [address] [phone]  [facility] [address], [place] [address] [phone]  [facility]  [address] [place] [address] [phone]  [facility]  [address], [place] [address] [phone] [facility]  [place] [address] [phone] [facility]  [address] [place] [address] [place]  [address] [place] [address] [phone] [facility]  [address], [place] [address] [phone] [facility]  [place], [place] [address] [phone]  [facility]  [address] [place] [address] [place]  [facility]  [address] [place] [address]  [place]  [facility]  [address] [place] [address] [phone]   Situation ongoing,  CSW will follow up.   Benjaman Kindler, MSW, [facility] 09/21/2022  @ 8:42 PM;

## 2022-09-21 NOTE — Consult Note (Signed)
[place] ED ASSESSMENT   Reason for Consult:  Psychosis Referring Physician:  Davonna Belling Patient Identification: Anaih Brander Po MRN:  [id number] ED Chief Complaint: Schizoaffective disorder, bipolar type [facility])  Diagnosis:  Principal Problem:   Schizoaffective disorder, bipolar type [facility])   ED Assessment Time Calculation: Start Time: 4196 Stop Time: 1450 Total Time in Minutes (Assessment Completion): 25   Subjective:   Jenetta Wease Pint is a 56 y.o. female patient admitted with a history of Schizoaffective disorder, bipolar type, presents to [facility] with her sister from [place] office for further evaluation  HPI:  Miki A. Choquette , a 56 y.o. female  with a history of Schizoaffective disorder, bipolar type, presents to [facility] with her sister from [place] office for further evaluation. She has been noncompliant with medications. She reports taking Propranolol and Methimazole but admits to not adhering to her psychiatric medications, including Olanzapine and Haldol. Additionally, the patient reports experiencing auditory hallucinations, which involve hearing voices, although she specifies that these voices are not command in nature. She also acknowledges visual hallucinations, recounting instances of seeing the devil. She denies experiencing suicidal or homicidal ideations. The patient reports living independently and mentions having a healthy appetite and good sleep patterns. Patient rapidly changes topics and is difficult to follow at times. She exposes her left at times during the assessment and makes references to her "boyfriend" liking her breasts. Patient is able to recall the correct place, time, day, year but at times during the assessment she states that she is at [place] and other times [facility]. Patient becomes tearful at one point and then immediately starts laughing.   On assessment today, the patient's appearance is casual and appropriate for her environment. She maintains good  eye contact throughout the evaluation, and her overall behavior is cooperative. However, she exhibits pressured speech, speaking at an increased volume and is unable to express her thoughts clearly. In terms of her emotional state, the patient's mood is marked by euphoria, while her affect is labile and tearful. Her thought process is disorganized and irrelevant. She demonstrates tangential associations, and her thought content includes paranoid ideation and perseveration. She endorses auditory and visual hallucinations, however it is unclear if she is currently experiencing AVH. She also denies the absence of any current suicidal or homicidal ideations.  Patient was medically admitted at [facility] for hypernatremia 08/11/22 to 08/16/22. She was discharged to [facility] for inpatient psychiatric treatment.   Past Psychiatric History: Schizoaffective disorder, Bipolar type, Generalized anxiety disorder.  Multiple inpatient hospitalizations. Inpatient at [facility] 08/23, [facility] 02/2022, [facility] 03/2022, [facility] 12/202, 09/2020.  Risk to Self or Others: Is the patient at risk to self? Yes Has the patient been a risk to self in the past 6 months? Yes Has the patient been a risk to self within the distant past? Yes Is the patient a risk to others? No Has the patient been a risk to others in the past 6 months? No Has the patient been a risk to others within the distant past? No  [place] Scale:  [place] ED from 09/21/2022 in [place] DEPT ED to Hosp-Admission (Discharged) from 08/10/2022 in [place] ED from 07/12/2022 in [place] DEPT  C-SSRS RISK CATEGORY No Risk No Risk No Risk       AIMS:  , , ,  ,   ASAM:    Substance Abuse:     Past Medical History:  Past Medical History:  Diagnosis Date   Bipolar affective disorder ([place])    Bipolar disorder ([place])    History of  arthritis    History of chicken pox    History of depression    History of genital warts    history of heart murmur    History of high blood pressure    History of thyroid disease    History of UTI    Hypertension    Low TSH level 07/13/2017   Schizophrenia ([place])     Past Surgical History:  Procedure Laterality Date   ABLATION ON ENDOMETRIOSIS     CYST REMOVAL NECK     around 11 years ago /benign   MULTIPLE TOOTH EXTRACTIONS     Family History:  Family History  Problem Relation Age of Onset   Arthritis Father    Hyperlipidemia Father    High blood pressure Father    Diabetes Sister    Diabetes Mother    Diabetes Brother    Mental illness Brother    Alcohol abuse Paternal Uncle    Alcohol abuse Paternal Grandfather    Breast cancer Maternal Aunt    Breast cancer Paternal Aunt    High blood pressure Sister    Mental illness Other        runs in family    Social History:  Social History   Substance and Sexual Activity  Alcohol Use Not Currently     Social History   Substance and Sexual Activity  Drug Use Not Currently    Social History   Socioeconomic History   Marital status: Single    Spouse name: Not on file   Number of children: Not on file   Years of education: Not on file   Highest education level: Patient refused  Occupational History   Not on file  Tobacco Use   Smoking status: Never   Smokeless tobacco: Never  Vaping Use   Vaping Use: Never used  Substance and Sexual Activity   Alcohol use: Not Currently   Drug use: Not Currently   Sexual activity: Not Currently  Other Topics Concern   Not on file  Social History Narrative   ** Merged History Encounter **       Social Determinants of Health   Financial Resource Strain: Not on file  Food Insecurity: Not on file  Transportation Needs: Not on file  Physical Activity: Not on file  Stress: Not on file  Social Connections: Not on file   Additional Social History:    Allergies:  No  Known Allergies  Labs:  Results for orders placed or performed during the hospital encounter of 09/21/22 (from the past 48 hour(s))  Rapid urine drug screen (hospital performed)     Status: None (Preliminary result)   Collection Time: 09/21/22  1:10 PM  Result Value Ref Range   Opiates PENDING NONE DETECTED   Cocaine NONE DETECTED NONE DETECTED   Benzodiazepines NONE DETECTED NONE DETECTED   Amphetamines NONE DETECTED NONE DETECTED   Tetrahydrocannabinol NONE DETECTED NONE DETECTED   Barbiturates NONE DETECTED NONE DETECTED    Comment: (NOTE) DRUG SCREEN FOR MEDICAL PURPOSES ONLY.  IF CONFIRMATION IS NEEDED FOR ANY PURPOSE, NOTIFY LAB WITHIN 5 DAYS.  LOWEST DETECTABLE LIMITS FOR URINE DRUG SCREEN Drug Class                     Cutoff (ng/mL) Amphetamine and metabolites    1000 Barbiturate and metabolites  200 Benzodiazepine                 200 Opiates and metabolites        300 Cocaine and metabolites        300 THC                            50 Performed at [facility] [address], [place], [place] [address]   CBC with Differential     Status: Abnormal   Collection Time: 09/21/22  1:15 PM  Result Value Ref Range   WBC 5.9 4.0 - 10.5 K/uL   RBC 3.86 (L) 3.87 - 5.11 MIL/uL   Hemoglobin 10.8 (L) 12.0 - 15.0 g/dL   HCT 34.1 (L) 36.0 - 46.0 %   MCV 88.3 80.0 - 100.0 fL   MCH 28.0 26.0 - 34.0 pg   MCHC 31.7 30.0 - 36.0 g/dL   RDW 14.9 11.5 - 15.5 %   Platelets 246 150 - 400 K/uL   nRBC 0.0 0.0 - 0.2 %   Neutrophils Relative % 47 %   Neutro Abs 2.8 1.7 - 7.7 K/uL   Lymphocytes Relative 40 %   Lymphs Abs 2.4 0.7 - 4.0 K/uL   Monocytes Relative 10 %   Monocytes Absolute 0.6 0.1 - 1.0 K/uL   Eosinophils Relative 2 %   Eosinophils Absolute 0.1 0.0 - 0.5 K/uL   Basophils Relative 1 %   Basophils Absolute 0.0 0.0 - 0.1 K/uL   Immature Granulocytes 0 %   Abs Immature Granulocytes 0.01 0.00 - 0.07 K/uL    Comment: Performed at [facility], [place] [address], [place], [place] [address]  Basic metabolic panel     Status: Abnormal   Collection Time: 09/21/22  1:15 PM  Result Value Ref Range   Sodium 140 135 - 145 mmol/L   Potassium 3.8 3.5 - 5.1 mmol/L   Chloride 109 98 - 111 mmol/L   CO2 24 22 - 32 mmol/L   Glucose, Bld 129 (H) 70 - 99 mg/dL    Comment: Glucose reference range applies only to samples taken after fasting for at least 8 hours.   BUN 22 (H) 6 - 20 mg/dL   Creatinine, Ser 1.19 (H) 0.44 - 1.00 mg/dL   Calcium 9.7 8.9 - 10.3 mg/dL   GFR, Estimated 53 (L) >60 mL/min    Comment: (NOTE) Calculated using the CKD-EPI Creatinine Equation (2021)    Anion gap 7 5 - 15    Comment: Performed at [facility], [place] [address], [place], [place] [address]  Ethanol     Status: None   Collection Time: 09/21/22  1:15 PM  Result Value Ref Range   Alcohol, Ethyl (B) <10 <10 mg/dL    Comment: (NOTE) Lowest detectable limit for serum alcohol is 10 mg/dL.  For medical purposes only. Performed at [facility], [place] [address] [place], [place] [address]   Acetaminophen level     Status: Abnormal   Collection Time: 09/21/22  1:15 PM  Result Value Ref Range   Acetaminophen (Tylenol), Serum <10 (L) 10 - 30 ug/mL    Comment: (NOTE) Therapeutic concentrations vary significantly. A range of 10-30 ug/mL  may be an effective concentration for many patients. However, some  are best treated at concentrations outside of this range. Acetaminophen concentrations >150 ug/mL at 4 hours after ingestion  and >50 ug/mL  at 12 hours after ingestion are often associated with  toxic reactions.  Performed at [facility], [place] [name], [place], [place] [address]   Salicylate level     Status: Abnormal   Collection Time: 09/21/22  1:15 PM  Result Value Ref Range   Salicylate Lvl <4.2 (L) 7.0 - 30.0 mg/dL    Comment: Performed at [facility], [place] [address] [place], [place] [address]  TSH     Status: None   Collection Time: 09/21/22  1:15 PM  Result Value Ref Range   TSH 2.722 0.350 - 4.500 uIU/mL    Comment: Performed by a 3rd Generation assay with a functional sensitivity of <=0.01 uIU/mL. Performed at [facility], [place] [address] [place],  [address]     Current Facility-Administered Medications  Medication Dose Route Frequency Provider Last Rate Last Admin   ziprasidone (GEODON) injection 20 mg  20 mg Intramuscular PRN Evlyn Courier, PA-C       Current Outpatient Medications  Medication Sig Dispense Refill   benztropine (COGENTIN) 0.5 MG tablet Take 0.5 mg by mouth 2 (two) times daily.     haloperidol (HALDOL) 5 MG tablet Take 1 tablet (5 mg total) by mouth 2 (two) times daily.     LORazepam (ATIVAN) 1 MG tablet Take 2 mg by mouth every 8 (eight) hours as needed for anxiety.     melatonin 3 MG TABS tablet Take 6 mg by mouth at bedtime.     methimazole (TAPAZOLE) 10 MG tablet Take 10 mg by mouth daily.     OLANZapine (ZYPREXA) 5 MG tablet Take 1 tablet (5 mg total) by mouth at bedtime.     propranolol (INDERAL) 10 MG tablet Take 1 tablet (10 mg total) by mouth 2 (two) times daily.      Musculoskeletal: Strength & Muscle Tone: within normal limits Gait & Station: not evaluated Patient leans: N/A   Psychiatric Specialty Exam: Presentation  General Appearance:  Appropriate for Environment; Casual  Eye Contact: Good  Speech: Pressured  Speech Volume: Increased  Handedness: Right   Mood and Affect  Mood: Euphoric  Affect: Labile; Tearful   Thought Process  Thought Processes: Disorganized; Irrevelant  Descriptions of Associations:Tangential  Orientation:Other (comment) (at times patient appears to be fully oriented, however, at times makes comments that indicate that she thinks she is other locations)  Thought Content:Illusions; Paranoid Ideation; Perseveration; Scattered  History of  Schizophrenia/Schizoaffective disorder:Yes  Duration of Psychotic Symptoms:Greater than six months  Hallucinations:Hallucinations: Auditory; Visual  Ideas of Reference:Paranoia; Percusatory; Delusions  Suicidal Thoughts:Suicidal Thoughts: No  Homicidal Thoughts:Homicidal Thoughts: No   Sensorium  Memory: Immediate Poor; Recent Poor; Remote Poor  Judgment: Impaired  Insight: Poor   Executive Functions  Concentration: Poor  Attention Span: Poor  Recall: Poor  Fund of Knowledge: Poor  Language: Poor   Psychomotor Activity  Psychomotor Activity: Psychomotor Activity: Normal   Assets  Assets: Financial Resources/Insurance    Sleep  Sleep: Sleep: Good   Physical Exam: Physical Exam Constitutional:      General: She is not in acute distress.    Appearance: She is not ill-appearing, toxic-appearing or diaphoretic.  Eyes:     General:        Right eye: No discharge.        Left eye: No discharge.  Cardiovascular:     Rate and Rhythm: Normal rate.  Pulmonary:     Effort: Pulmonary effort is normal. No respiratory distress.  Musculoskeletal:        General: Normal range of motion.     Cervical back: Normal range of motion.  Neurological:     Mental Status: She is alert.  Psychiatric:        Speech: Speech is rapid and pressured.        Behavior: Behavior is cooperative.        Thought Content: Thought content does not include homicidal or suicidal ideation.    Review of Systems  Respiratory:  Negative for cough and shortness of breath.   Cardiovascular:  Negative for chest pain.  Gastrointestinal:  Negative for diarrhea, nausea and vomiting.  Psychiatric/Behavioral:  Negative for suicidal ideas.     Blood pressure 132/65, pulse 70, temperature (!) 97.5 F (36.4 C), temperature source Oral, resp. rate (!) 22, height '5\' 1"'$  (1.549 m), SpO2 98 %. Body mass index is 24.87 kg/m.  Medical Decision Making: Dede Query. Hitchcock , a 56 y.o. female   with a history of Schizoaffective disorder, bipolar type, presents to [facility] with her sister from [place] office for further evaluation. She has been noncompliant with medications. She reports taking Propranolol and Methimazole but admits to not adhering to her psychiatric medications, including Olanzapine and Haldol. Additionally, the patient reports experiencing auditory hallucinations, which involve hearing voices, although she specifies that these voices are not command in nature. She also acknowledges visual hallucinations, recounting instances of seeing the devil. She denies experiencing suicidal or homicidal ideations. The patient reports living independently and mentions having a healthy appetite and good sleep patterns. Patient rapidly changes topics and is difficult to follow at times. She exposes her left at times during the assessment and makes references to her "boyfriend" liking her breasts. Patient is able to recall the correct place, time, day, year but at times during the assessment she states that she is at [place] and other times [facility]. Patient becomes tearful at one point and then immediately starts laughing.   UA negative TSH 2.722   Disposition: Recommend psychiatric Inpatient admission when medically cleared.  Rozetta Nunnery, NP 09/21/2022 3:01 PM

## 2022-09-21 NOTE — ED Provider Notes (Signed)
[facility] DEPT Provider Note   CSN: [id number] Arrival date & time: 09/21/22  1153     History  Chief Complaint  Patient presents with   Medical Clearance    Paula Dickson is a 56 y.o. female.  HPI Patient with history of schizophrenia.  Sent in from [place].  Mental status change.  Reportedly is having hallucinations.  For me patient cannot provide any history.  Presents with her sister.  Reportedly been off her medicines.  Past Medical History:  Diagnosis Date   Bipolar affective disorder ([place])    Bipolar disorder ([place])    History of arthritis    History of chicken pox    History of depression    History of genital warts    history of heart murmur    History of high blood pressure    History of thyroid disease    History of UTI    Hypertension    Low TSH level 07/13/2017   Schizophrenia ([place])        Home Medications Prior to Admission medications   Medication Sig Start Date End Date Taking? Authorizing Provider  benztropine (COGENTIN) 0.5 MG tablet Take 0.5 mg by mouth 2 (two) times daily. 03/07/22   [provider]  haloperidol (HALDOL) 5 MG tablet Take 1 tablet (5 mg total) by mouth 2 (two) times daily. 08/16/22   Antonieta Pert, MD  LORazepam (ATIVAN) 1 MG tablet Take 2 mg by mouth every 8 (eight) hours as needed for anxiety. 01/04/22   [provider]  melatonin 3 MG TABS tablet Take 6 mg by mouth at bedtime.    [provider]  methimazole (TAPAZOLE) 10 MG tablet Take 10 mg by mouth daily. 01/26/22   [provider]  OLANZapine (ZYPREXA) 5 MG tablet Take 1 tablet (5 mg total) by mouth at bedtime. 08/16/22   Antonieta Pert, MD  propranolol (INDERAL) 10 MG tablet Take 1 tablet (10 mg total) by mouth 2 (two) times daily. 08/16/22   Antonieta Pert, MD      Allergies    Patient has no known allergies.    Review of Systems   Review of Systems  Physical Exam Updated Vital Signs BP 131/73   Pulse 68    Temp 97.9 F (36.6 C) (Oral)   Resp 14   Ht '5\' 1"'$  (1.549 m)   SpO2 99%   BMI 24.87 kg/m  Physical Exam Vitals and nursing note reviewed.  Constitutional:      Appearance: Normal appearance.  Eyes:     Pupils: Pupils are equal, round, and reactive to light.  Cardiovascular:     Rate and Rhythm: Normal rate.  Musculoskeletal:        General: No tenderness.  Skin:    General: Skin is warm.     Capillary Refill: Capillary refill takes less than 2 seconds.  Neurological:     Mental Status: She is alert.     Comments: Awake and moves all extremities.  Able to identify her sister but otherwise pressured and really nonsensical.     ED Results / Procedures / Treatments   Labs (all labs ordered are listed, but only abnormal results are displayed) Labs Reviewed  CBC WITH DIFFERENTIAL/PLATELET - Abnormal; Notable for the following components:      Result Value   RBC 3.86 (*)    Hemoglobin 10.8 (*)    HCT 34.1 (*)    All other components within normal limits  BASIC METABOLIC  PANEL - Abnormal; Notable for the following components:   Glucose, Bld 129 (*)    BUN 22 (*)    Creatinine, Ser 1.19 (*)    GFR, Estimated 53 (*)    All other components within normal limits  ACETAMINOPHEN LEVEL - Abnormal; Notable for the following components:   Acetaminophen (Tylenol), Serum <10 (*)    All other components within normal limits  SALICYLATE LEVEL - Abnormal; Notable for the following components:   Salicylate Lvl <6.2 (*)    All other components within normal limits  RESP PANEL BY RT-PCR (FLU A&B, COVID) ARPGX2  RAPID URINE DRUG SCREEN, HOSP PERFORMED  ETHANOL  TSH  T3, FREE  T4, FREE  URINALYSIS, ROUTINE W REFLEX MICROSCOPIC    EKG EKG Interpretation  Date/Time:  Thursday September 21 2022 13:11:44 EDT Ventricular Rate:  82 PR Interval:    QRS Duration: 92 QT Interval:  351 QTC Calculation: 410 R Axis:   16 Text Interpretation: Sinus rhythm Low voltage, precordial leads  Abnormal R-wave progression, early transition Nonspecific T abnormalities, anterior leads Reconfirmed by Davonna Belling [phone]) on 09/21/2022 3:15:56 PM  Radiology No results found.  Procedures Procedures    Medications Ordered in ED Medications  ziprasidone (GEODON) injection 20 mg (has no administration in time range)  benztropine (COGENTIN) tablet 0.5 mg (has no administration in time range)  haloperidol (HALDOL) tablet 5 mg (has no administration in time range)  LORazepam (ATIVAN) tablet 2 mg (has no administration in time range)  melatonin tablet 6 mg (has no administration in time range)  methimazole (TAPAZOLE) tablet 10 mg (has no administration in time range)  OLANZapine (ZYPREXA) tablet 5 mg (has no administration in time range)  propranolol (INDERAL) tablet 10 mg (has no administration in time range)  haloperidol lactate (HALDOL) injection 2 mg (2 mg Intravenous Given 09/21/22 1404)    ED Course/ Medical Decision Making/ A&P                           Medical Decision Making Amount and/or Complexity of Data Reviewed Labs: ordered.  Risk Prescription drug management.   Patient brought in mental status change.  Psychiatric history.  Reportedly somewhat aggressive at home.  Noncompliant with medications.  History comes from sister.  We will get lab work for medical clearance.  Of note patient has had a hyperthyroidism in the past and that she has been off her medications for that also.  Has been on Tapazole and propanolol.  Lab work overall reassuring.  TSH is reassuring.  Urinalysis still pending at this time. Started back on home medicines.  Patient is medically cleared.        Final Clinical Impression(s) / ED Diagnoses Final diagnoses:  Schizophrenia, unspecified type [facility])    Rx / DC Orders ED Discharge Orders     None         Davonna Belling, MD 09/21/22 1646

## 2022-09-21 NOTE — ED Provider Notes (Signed)
F/U UA. Otherwise medication and planning for psyche eval in place. Physical Exam  BP 131/73   Pulse 68   Temp 97.9 F (36.6 C) (Oral)   Resp 14   Ht '5\' 1"'$  (1.549 m)   SpO2 99%   BMI 24.87 kg/m   Physical Exam  Procedures  Procedures  ED Course / MDM    Medical Decision Making Amount and/or Complexity of Data Reviewed Labs: ordered.  Risk OTC drugs. Prescription drug management.   Urinalysis normal with no signs of UTI.  Patient is medically cleared for psychiatric evaluation and treatment.       Charlesetta Shanks, MD 09/21/22 1710

## 2022-09-21 NOTE — ED Notes (Signed)
Pt changed into burgundy scrubs, belongings bags X 2 placed in cabinet for room 15. Pt tolerated well. Security called to wand patient.

## 2022-09-22 DIAGNOSIS — F250 Schizoaffective disorder, bipolar type: Secondary | ICD-10-CM | POA: Diagnosis not present

## 2022-09-22 LAB — SARS CORONAVIRUS 2 BY RT PCR: SARS Coronavirus 2 by RT PCR: NEGATIVE

## 2022-09-22 NOTE — ED Notes (Signed)
Attempted to call report with no answer

## 2022-09-22 NOTE — ED Notes (Signed)
Patient slept through ou the night she was calm and cooperative.

## 2022-09-22 NOTE — ED Provider Notes (Signed)
Emergency Medicine Observation Re-evaluation Note  Paula Dickson is a 56 y.o. female, seen on rounds today.  Pt initially presented to the ED for complaints of Medical Clearance Currently, the patient is asleep.  No problems over night.  Physical Exam  BP 116/70 (BP Location: Right Arm)   Pulse 66   Temp 98.7 F (37.1 C) (Oral)   Resp 16   Ht '5\' 1"'$  (1.549 m)   SpO2 100%   BMI 24.87 kg/m  Physical Exam General: asleep Cardiac: rr Lungs: clear Psych: asleep  ED Course / MDM  EKG:EKG Interpretation  Date/Time:  Thursday September 21 2022 13:11:44 EDT Ventricular Rate:  82 PR Interval:    QRS Duration: 92 QT Interval:  351 QTC Calculation: 410 R Axis:   16 Text Interpretation: Sinus rhythm Low voltage, precordial leads Abnormal R-wave progression, early transition Nonspecific T abnormalities, anterior leads Reconfirmed by Davonna Belling [phone]) on 09/21/2022 3:15:56 PM  I have reviewed the labs performed to date as well as medications administered while in observation.  Recent changes in the last 24 hours include acceptance at [facility] for this am.  Plan  Current plan is for tx to [facility] today.    Isla Pence, MD 09/22/22 [phone]

## 2022-09-22 NOTE — ED Notes (Signed)
Belongings give to [employer]

## 2022-09-22 NOTE — ED Notes (Signed)
Report called to wanda jones at [facility] called for transport, will arrive at 8:30-9:00 for patient

## 2022-09-22 NOTE — ED Notes (Signed)
Called sheriff dept to check on transportation

## 2022-09-23 LAB — T3, FREE: T3, Free: 3.4 pg/mL (ref 2.0–4.4)

## 2023-03-19 ENCOUNTER — Encounter: Payer: Medicare HMO | Admitting: Family Medicine

## 2023-03-19 ENCOUNTER — Encounter: Payer: Self-pay | Admitting: Family Medicine

## 2023-03-19 NOTE — Progress Notes (Signed)
Patient did not keep appointment today. She may call to reschedule.  

## 2023-10-03 ENCOUNTER — Encounter: Payer: Self-pay | Admitting: Podiatry

## 2023-10-03 ENCOUNTER — Ambulatory Visit (INDEPENDENT_AMBULATORY_CARE_PROVIDER_SITE_OTHER): Payer: Medicare HMO | Admitting: Podiatry

## 2023-10-03 ENCOUNTER — Ambulatory Visit (INDEPENDENT_AMBULATORY_CARE_PROVIDER_SITE_OTHER): Payer: Medicare HMO

## 2023-10-03 DIAGNOSIS — M79674 Pain in right toe(s): Secondary | ICD-10-CM | POA: Diagnosis not present

## 2023-10-03 DIAGNOSIS — M2012 Hallux valgus (acquired), left foot: Secondary | ICD-10-CM

## 2023-10-03 DIAGNOSIS — M778 Other enthesopathies, not elsewhere classified: Secondary | ICD-10-CM

## 2023-10-03 DIAGNOSIS — B351 Tinea unguium: Secondary | ICD-10-CM

## 2023-10-03 DIAGNOSIS — M79675 Pain in left toe(s): Secondary | ICD-10-CM | POA: Diagnosis not present

## 2023-10-03 NOTE — Progress Notes (Signed)
   Chief Complaint  Patient presents with   Foot Pain    Patient states left foot pain history of surgery several years ago.Unable to provide much information. Nail trim    SUBJECTIVE Patient presents to office today complaining of elongated, thickened nails that cause pain while ambulating in shoes.  Patient is unable to trim their own nails.   Patient also states that she has history of bunionectomy surgery to the left foot several years prior.  She has also bunion deformity to the right foot.  Currently asymptomatic.  Patient is here for further evaluation and treatment.  Past Medical History:  Diagnosis Date   Bipolar affective disorder (HCC)    Bipolar disorder (HCC)    History of arthritis    History of chicken pox    History of depression    History of genital warts    history of heart murmur    History of high blood pressure    History of thyroid disease    History of UTI    Hypertension    Low TSH level 07/13/2017   Schizophrenia (HCC)     No Known Allergies   OBJECTIVE General Patient is awake, alert, and oriented x 3 and in no acute distress. Derm Skin is dry and supple bilateral. Negative open lesions or macerations. Remaining integument unremarkable. Nails are tender, long, thickened and dystrophic with subungual debris, consistent with onychomycosis, 1-5 bilateral. No signs of infection noted. Vasc  DP and PT pedal pulses palpable bilaterally. Temperature gradient within normal limits.  Moderate edema noted bilateral lower extremities Neuro grossly intact via light touch Musculoskeletal Exam hallux deformity noted to the right foot.  History of bunionectomy to the left Radiographic exam LT foot 10/03/2023 history of bunionectomy surgery with a single K wire fixation into the first metatarsal.  It appears stable.  ASSESSMENT 1.  Pain due to onychomycosis of toenails both 2.  Hallux valgus right 3.  History of bunionectomy left  PLAN OF CARE 1. Patient  evaluated today.  X-rays reviewed 2. Instructed to maintain good pedal hygiene and foot care.  3. Mechanical debridement of nails 1-5 bilaterally performed using a nail nipper. Filed with dremel without incident.  4.  In regards to the bunion deformity to the right foot recommend conservative treatment.  Recommend shoes that do not irritate the bunion deformity 5.  Return to clinic as needed   Felecia Shelling, DPM [facility]  Dr. Felecia Shelling, DPM    [address] [place], [place] [address]                Office [phone]  Fax [phone]

## 2023-12-02 ENCOUNTER — Encounter (HOSPITAL_COMMUNITY): Payer: Self-pay

## 2023-12-02 ENCOUNTER — Emergency Department (HOSPITAL_COMMUNITY): Payer: Medicare HMO

## 2023-12-02 ENCOUNTER — Inpatient Hospital Stay (HOSPITAL_COMMUNITY)
Admission: EM | Admit: 2023-12-02 | Discharge: 2023-12-14 | DRG: 070 | Disposition: A | Discharge status: Skilled Nursing Facility | Payer: Medicare HMO | Attending: Internal Medicine | Admitting: Internal Medicine

## 2023-12-02 DIAGNOSIS — Z8261 Family history of arthritis: Secondary | ICD-10-CM

## 2023-12-02 DIAGNOSIS — G252 Other specified forms of tremor: Secondary | ICD-10-CM | POA: Diagnosis present

## 2023-12-02 DIAGNOSIS — U071 COVID-19: Secondary | ICD-10-CM | POA: Diagnosis not present

## 2023-12-02 DIAGNOSIS — I129 Hypertensive chronic kidney disease with stage 1 through stage 4 chronic kidney disease, or unspecified chronic kidney disease: Secondary | ICD-10-CM | POA: Diagnosis present

## 2023-12-02 DIAGNOSIS — E0590 Thyrotoxicosis, unspecified without thyrotoxic crisis or storm: Secondary | ICD-10-CM | POA: Diagnosis present

## 2023-12-02 DIAGNOSIS — J9811 Atelectasis: Secondary | ICD-10-CM | POA: Diagnosis present

## 2023-12-02 DIAGNOSIS — R4182 Altered mental status, unspecified: Principal | ICD-10-CM

## 2023-12-02 DIAGNOSIS — R000 Tachycardia, unspecified: Secondary | ICD-10-CM | POA: Diagnosis present

## 2023-12-02 DIAGNOSIS — F05 Delirium due to known physiological condition: Secondary | ICD-10-CM | POA: Diagnosis present

## 2023-12-02 DIAGNOSIS — F411 Generalized anxiety disorder: Secondary | ICD-10-CM | POA: Diagnosis present

## 2023-12-02 DIAGNOSIS — E870 Hyperosmolality and hypernatremia: Secondary | ICD-10-CM | POA: Diagnosis present

## 2023-12-02 DIAGNOSIS — N179 Acute kidney failure, unspecified: Secondary | ICD-10-CM | POA: Diagnosis present

## 2023-12-02 DIAGNOSIS — R270 Ataxia, unspecified: Secondary | ICD-10-CM | POA: Diagnosis present

## 2023-12-02 DIAGNOSIS — Z79899 Other long term (current) drug therapy: Secondary | ICD-10-CM

## 2023-12-02 DIAGNOSIS — K7689 Other specified diseases of liver: Secondary | ICD-10-CM | POA: Diagnosis present

## 2023-12-02 DIAGNOSIS — Z818 Family history of other mental and behavioral disorders: Secondary | ICD-10-CM

## 2023-12-02 DIAGNOSIS — I480 Paroxysmal atrial fibrillation: Secondary | ICD-10-CM | POA: Diagnosis present

## 2023-12-02 DIAGNOSIS — E875 Hyperkalemia: Secondary | ICD-10-CM | POA: Diagnosis present

## 2023-12-02 DIAGNOSIS — M1990 Unspecified osteoarthritis, unspecified site: Secondary | ICD-10-CM | POA: Diagnosis present

## 2023-12-02 DIAGNOSIS — J690 Pneumonitis due to inhalation of food and vomit: Secondary | ICD-10-CM | POA: Diagnosis present

## 2023-12-02 DIAGNOSIS — Z87440 Personal history of urinary (tract) infections: Secondary | ICD-10-CM

## 2023-12-02 DIAGNOSIS — R4189 Other symptoms and signs involving cognitive functions and awareness: Secondary | ICD-10-CM | POA: Diagnosis present

## 2023-12-02 DIAGNOSIS — G9341 Metabolic encephalopathy: Principal | ICD-10-CM | POA: Diagnosis present

## 2023-12-02 DIAGNOSIS — T426X5A Adverse effect of other antiepileptic and sedative-hypnotic drugs, initial encounter: Secondary | ICD-10-CM | POA: Diagnosis present

## 2023-12-02 DIAGNOSIS — R7989 Other specified abnormal findings of blood chemistry: Secondary | ICD-10-CM | POA: Diagnosis present

## 2023-12-02 DIAGNOSIS — Z7401 Bed confinement status: Secondary | ICD-10-CM

## 2023-12-02 DIAGNOSIS — R5381 Other malaise: Secondary | ICD-10-CM | POA: Diagnosis present

## 2023-12-02 DIAGNOSIS — R633 Feeding difficulties, unspecified: Secondary | ICD-10-CM | POA: Diagnosis present

## 2023-12-02 DIAGNOSIS — E871 Hypo-osmolality and hyponatremia: Secondary | ICD-10-CM | POA: Diagnosis present

## 2023-12-02 DIAGNOSIS — E860 Dehydration: Secondary | ICD-10-CM | POA: Diagnosis present

## 2023-12-02 DIAGNOSIS — E890 Postprocedural hypothyroidism: Secondary | ICD-10-CM | POA: Diagnosis present

## 2023-12-02 DIAGNOSIS — F250 Schizoaffective disorder, bipolar type: Secondary | ICD-10-CM | POA: Diagnosis present

## 2023-12-02 DIAGNOSIS — N182 Chronic kidney disease, stage 2 (mild): Secondary | ICD-10-CM | POA: Diagnosis present

## 2023-12-02 LAB — TSH: TSH: 3.325 u[IU]/mL (ref 0.350–4.500)

## 2023-12-02 LAB — COMPREHENSIVE METABOLIC PANEL
ALT: 41 U/L (ref 0–44)
AST: 43 U/L — ABNORMAL HIGH (ref 15–41)
Albumin: 4.2 g/dL (ref 3.5–5.0)
Alkaline Phosphatase: 204 U/L — ABNORMAL HIGH (ref 38–126)
Anion gap: 16 — ABNORMAL HIGH (ref 5–15)
BUN: 25 mg/dL — ABNORMAL HIGH (ref 6–20)
CO2: 25 mmol/L (ref 22–32)
Calcium: 11.6 mg/dL — ABNORMAL HIGH (ref 8.9–10.3)
Chloride: 100 mmol/L (ref 98–111)
Creatinine, Ser: 1.56 mg/dL — ABNORMAL HIGH (ref 0.44–1.00)
GFR, Estimated: 38 mL/min — ABNORMAL LOW (ref >60)
Glucose, Bld: 109 mg/dL — ABNORMAL HIGH (ref 70–99)
Potassium: 4.1 mmol/L (ref 3.5–5.1)
Sodium: 141 mmol/L (ref 135–145)
Total Bilirubin: 0.7 mg/dL (ref 0.0–1.2)
Total Protein: 8.5 g/dL — ABNORMAL HIGH (ref 6.5–8.1)

## 2023-12-02 LAB — AMMONIA: Ammonia: <10 umol/L (ref 9–35)

## 2023-12-02 LAB — CBC WITH DIFFERENTIAL/PLATELET
Abs Immature Granulocytes: 0.04 10*3/uL (ref 0.00–0.07)
Basophils Absolute: 0 10*3/uL (ref 0.0–0.1)
Basophils Relative: 0 %
Eosinophils Absolute: 0 10*3/uL (ref 0.0–0.5)
Eosinophils Relative: 0 %
HCT: 49.4 % — ABNORMAL HIGH (ref 36.0–46.0)
Hemoglobin: 16.3 g/dL — ABNORMAL HIGH (ref 12.0–15.0)
Immature Granulocytes: 0 %
Lymphocytes Relative: 6 %
Lymphs Abs: 0.6 10*3/uL — ABNORMAL LOW (ref 0.7–4.0)
MCH: 30.8 pg (ref 26.0–34.0)
MCHC: 33 g/dL (ref 30.0–36.0)
MCV: 93.4 fL (ref 80.0–100.0)
Monocytes Absolute: 0.9 10*3/uL (ref 0.1–1.0)
Monocytes Relative: 9 %
Neutro Abs: 9 10*3/uL — ABNORMAL HIGH (ref 1.7–7.7)
Neutrophils Relative %: 85 %
Platelets: 150 10*3/uL (ref 150–400)
RBC: 5.29 MIL/uL — ABNORMAL HIGH (ref 3.87–5.11)
RDW: 14.8 % (ref 11.5–15.5)
WBC: 10.6 10*3/uL — ABNORMAL HIGH (ref 4.0–10.5)
nRBC: 0 % (ref 0.0–0.2)

## 2023-12-02 LAB — RESP PANEL BY RT-PCR (RSV, FLU A&B, COVID)  RVPGX2
Influenza A by PCR: NEGATIVE
Influenza B by PCR: NEGATIVE
Resp Syncytial Virus by PCR: NEGATIVE
SARS Coronavirus 2 by RT PCR: POSITIVE — AB

## 2023-12-02 LAB — LITHIUM LEVEL: Lithium Lvl: 1.28 mmol/L — ABNORMAL HIGH (ref 0.60–1.20)

## 2023-12-02 LAB — CBG MONITORING, ED: Glucose-Capillary: 115 mg/dL — ABNORMAL HIGH (ref 70–99)

## 2023-12-02 LAB — VALPROIC ACID LEVEL: Valproic Acid Lvl: <10 ug/mL — ABNORMAL LOW (ref 50.0–100.0)

## 2023-12-02 MED ORDER — ONDANSETRON HCL 4 MG/2ML IJ SOLN: 4.0000 mg | Freq: Four times a day (QID) | INTRAMUSCULAR | Status: DC | PRN

## 2023-12-02 MED ORDER — HEPARIN (PORCINE) 25000 UT/250ML-% IV SOLN: 950.0000 [IU]/h | INTRAVENOUS | Status: DC

## 2023-12-02 MED ORDER — LORAZEPAM 2 MG/ML IJ SOLN
0.5000 mg | Freq: Once | INTRAMUSCULAR | Status: AC | PRN
  Administered 2023-12-02: 0.5 mg via INTRAVENOUS
  Filled 2023-12-02: qty 1

## 2023-12-02 MED ORDER — ACETAMINOPHEN 325 MG PO TABS: 650.0000 mg | ORAL_TABLET | Freq: Four times a day (QID) | ORAL | Status: DC | PRN

## 2023-12-02 MED ORDER — METOPROLOL TARTRATE 5 MG/5ML IV SOLN: 5.0000 mg | Freq: Four times a day (QID) | INTRAVENOUS | Status: DC | PRN

## 2023-12-02 MED ORDER — SODIUM CHLORIDE 0.9 % IV BOLUS
1000.0000 mL | Freq: Once | INTRAVENOUS | Status: AC
  Administered 2023-12-02: 1000 mL via INTRAVENOUS

## 2023-12-02 MED ORDER — SODIUM CHLORIDE 0.9 % IV SOLN: INTRAVENOUS | Status: DC

## 2023-12-02 MED ORDER — HEPARIN BOLUS VIA INFUSION
3000.0000 [IU] | Freq: Once | INTRAVENOUS | Status: DC
  Filled 2023-12-02: qty 3000

## 2023-12-02 MED ORDER — ONDANSETRON HCL 4 MG PO TABS: 4.0000 mg | ORAL_TABLET | Freq: Four times a day (QID) | ORAL | Status: DC | PRN

## 2023-12-02 MED ORDER — ACETAMINOPHEN 650 MG RE SUPP
650.0000 mg | Freq: Four times a day (QID) | RECTAL | Status: DC | PRN
Start: 2023-12-02 — End: 2023-12-14

## 2023-12-02 MED ORDER — LACTATED RINGERS IV BOLUS: 1000.0000 mL | Freq: Once | INTRAVENOUS | Status: DC

## 2023-12-02 NOTE — ED Provider Notes (Signed)
 [place] [facility] Provider Note   CSN: [id number] Arrival date & time: 12/02/23  1359     History  Chief Complaint  Patient presents with   Altered Mental Status    Paula Dickson is a 58 y.o. female.  Pt is a 58 yo female with pmhx significant for schizophrenia, htn, and bipolar d/o.  Pt lives alone and family checked on her today.  She was in bed with decreased responsiveness and tremors. Pt was last seen normal on Friday, 1/3. Pt is unable to give any hx.  Pt is normally alert and oriented and ambulatory.       Home Medications Prior to Admission medications   Medication Sig Start Date End Date Taking? Authorizing Provider  benztropine  (COGENTIN ) 0.5 MG tablet Take 0.5 mg by mouth 2 (two) times daily. 03/07/22   [provider]  haloperidol  (HALDOL ) 5 MG tablet Take 1 tablet (5 mg total) by mouth 2 (two) times daily. 08/16/22   Christobal Guadalajara, MD  LORazepam  (ATIVAN ) 1 MG tablet Take 2 mg by mouth every 8 (eight) hours as needed for anxiety. 01/04/22   [provider]  losartan  (COZAAR ) 25 MG tablet Take 25 mg by mouth daily. 09/15/22   [provider]  melatonin 3 MG TABS tablet Take 6 mg by mouth at bedtime.    [provider]  methimazole  (TAPAZOLE ) 10 MG tablet Take 10 mg by mouth daily. 01/26/22   [provider]  OLANZapine  (ZYPREXA ) 5 MG tablet Take 1 tablet (5 mg total) by mouth at bedtime. 08/16/22   Christobal Guadalajara, MD  propranolol  (INDERAL ) 10 MG tablet Take 1 tablet (10 mg total) by mouth 2 (two) times daily. 08/16/22   Christobal Guadalajara, MD      Allergies    Patient has no known allergies.    Review of Systems   Review of Systems  Unable to perform ROS: Mental status change  All other systems reviewed and are negative.   Physical Exam Updated Vital Signs BP (!) 131/91   Pulse (!) 126   Temp (!) 97.5 F (36.4 C)   Resp 18   Ht 5' 1 (1.549 m)   Wt 59.7 kg   SpO2 97%   BMI 24.87 kg/m   Physical Exam Vitals and nursing note reviewed.  Constitutional:      Appearance: Normal appearance.  HENT:     Head: Normocephalic and atraumatic.     Right Ear: External ear normal.     Left Ear: External ear normal.     Nose: Nose normal.     Mouth/Throat:     Mouth: Mucous membranes are moist.     Pharynx: Oropharynx is clear.  Eyes:     Extraocular Movements: Extraocular movements intact.     Conjunctiva/sclera: Conjunctivae normal.     Pupils: Pupils are equal, round, and reactive to light.  Cardiovascular:     Rate and Rhythm: Tachycardia present. Rhythm irregular.     Pulses: Normal pulses.     Heart sounds: Normal heart sounds.  Pulmonary:     Effort: Pulmonary effort is normal.     Breath sounds: Normal breath sounds.  Abdominal:     General: Abdomen is flat. Bowel sounds are normal.     Palpations: Abdomen is soft.  Musculoskeletal:        General: Normal range of motion.     Cervical back: Normal range of motion and neck supple.  Skin:  General: Skin is warm.     Capillary Refill: Capillary refill takes less than 2 seconds.  Neurological:     Mental Status: She is alert.     Comments: Left sided neglect, garbled speech, spontaneous eye mvmt  Psychiatric:     Comments: Unable to assess     ED Results / Procedures / Treatments   Labs (all labs ordered are listed, but only abnormal results are displayed) Labs Reviewed  CBG MONITORING, ED - Abnormal; Notable for the following components:      Result Value   Glucose-Capillary 115 (*)    All other components within normal limits  CBC WITH DIFFERENTIAL/PLATELET  URINALYSIS, ROUTINE W REFLEX MICROSCOPIC  RAPID URINE DRUG SCREEN, HOSP PERFORMED  TSH  CBC WITH DIFFERENTIAL/PLATELET  COMPREHENSIVE METABOLIC PANEL    EKG EKG Interpretation Date/Time:  Sunday December 02 2023 14:15:27 EST Ventricular Rate:  123 PR Interval:    QRS Duration:  90 QT Interval:  320 QTC Calculation: 458 R  Axis:   14  Text Interpretation: Atrial fibrillation Abnormal R-wave progression, early transition LVH with secondary repolarization abnormality now in afib Confirmed by Dean Clarity [phone]) on 12/02/2023 2:35:29 PM  Radiology DG Chest Portable 1 View Result Date: 12/02/2023 CLINICAL DATA:  Altered mental status. EXAM: PORTABLE CHEST 1 VIEW COMPARISON:  Chest radiograph dated 03/22/2021. FINDINGS: The heart size and mediastinal contours are within normal limits. Mild left basilar atelectasis/airspace disease. The right lung is clear. No pleural effusion or pneumothorax. The visualized skeletal structures are unremarkable. IMPRESSION: Mild left basilar atelectasis/airspace disease. Electronically Signed   By: Norman Hopper M.D.   On: 12/02/2023 15:40   CT Head Wo Contrast Result Date: 12/02/2023 CLINICAL DATA:  Mental status change, unknown cause EXAM: CT HEAD WITHOUT CONTRAST TECHNIQUE: Contiguous axial images were obtained from the base of the skull through the vertex without intravenous contrast. RADIATION DOSE REDUCTION: This exam was performed according to the departmental dose-optimization program which includes automated exposure control, adjustment of the mA and/or kV according to patient size and/or use of iterative reconstruction technique. COMPARISON:  Head CT 03/18/2021 FINDINGS: Brain: No hemorrhage. No hydrocephalus. No extra-axial fluid collection. No mass effect. No mass lesion. No CT evidence of an acute cortical infarct. Vascular: No hyperdense vessel or unexpected calcification. Skull: Normal. Negative for fracture or focal lesion. Sinuses/Orbits: No middle ear or mastoid effusion. Pansinus mucosal thickening. Orbits are unremarkable. Other: None. IMPRESSION: No CT etiology for altered mental status identified Electronically Signed   By: Lyndall Gore M.D.   On: 12/02/2023 15:06    Procedures Procedures    Medications Ordered in ED Medications  sodium chloride  0.9 % bolus 1,000 mL  (has no administration in time range)    ED Course/ Medical Decision Making/ A&P                                 Medical Decision Making Amount and/or Complexity of Data Reviewed Labs: ordered. Radiology: ordered.   This patient presents to the ED for concern of ams, this involves an extensive number of treatment options, and is a complaint that carries with it a high risk of complications and morbidity.  The differential diagnosis includes cva, psych, infection, encephalopathy   Co morbidities that complicate the patient evaluation  schizophrenia, htn, and bipolar d/o   Additional history obtained:  Additional history obtained from epic chart review External records from outside source obtained and reviewed  including EMS report   Lab Tests:  I Ordered, and personally interpreted labs.  The pertinent results include:  pending at shift change   Imaging Studies ordered:  I ordered imaging studies including ct head, cxr, mri brain  I independently visualized and interpreted imaging which showed  CT head: No CT etiology for altered mental status identified  CXR: Mild left basilar atelectasis/airspace disease.  MRI brain pending at shift change I agree with the radiologist interpretation   Cardiac Monitoring:  The patient was maintained on a cardiac monitor.  I personally viewed and interpreted the cardiac monitored which showed an underlying rhythm of: afib   Medicines ordered and prescription drug management:  I ordered medication including ivfs  for sx  Reevaluation of the patient after these medicines showed that the patient improved I have reviewed the patients home medicines and have made adjustments as needed   Test Considered:  Ct/mri   Problem List / ED Course:  New onset afib:  blood thinners held until possibility of cva ruled out.   Reevaluation:  After the interventions noted above, I reevaluated the patient and found that they have  :improved   Social Determinants of Health:  Lives at home   Dispostion:  After consideration of the diagnostic results and the patients response to treatment, I feel that the patent would benefit from admission.          Final Clinical Impression(s) / ED Diagnoses Final diagnoses:  None    Rx / DC Orders ED Discharge Orders     None         Dean Clarity, MD 12/02/23 1546

## 2023-12-02 NOTE — Assessment & Plan Note (Addendum)
 CHADS VASC score of 2 with h/o HTN in chart. No stroke on MRI today But may want to consider starting AC long term with this history. Likely need to discuss with pt / family once more stable. Putting pt on heparin  drip for the moment Metoprolol  IV PRN rate control for RVR

## 2023-12-02 NOTE — Assessment & Plan Note (Signed)
 TSH nl today.

## 2023-12-02 NOTE — Assessment & Plan Note (Signed)
 Question if dehydration? H/o not eating / drinking if she misses any antipsychotic meds (admitted her for same in 2023) IVF Strict intake and output Repeat BMP In AM

## 2023-12-02 NOTE — ED Triage Notes (Signed)
 Pt coming in from home. Her sister found her laying in her bed unresponsive at about 1300. Pt was completely unresponsive with full body tremors when ems arrived. Pt has been unable to speak or follow commands. Pt sister found her in bed no signs of head trauma. Pt last known well Friday 8pm. Yesterday pt was acting erratic. Pt has a psych history.   Ems  Bp 142 palp Hr 120  98% ra  Cbg 140

## 2023-12-02 NOTE — ED Notes (Signed)
Pt CBG was 115.

## 2023-12-02 NOTE — ED Provider Notes (Signed)
 3:51 PM Care assumed from Dr. Dean.  At time of transfer care, patient awaiting labs to be completed, MRI to be done, and then she will need admission for altered mental status.  She also reportedly has new A-fib.  7:49 PM Continues to return.  Patient is still somnolent and not answering questions.  Her MRI did not show acute stroke and x-ray did not show pneumothorax.  It did show mild left basilar atelectasis/airspace disease but did not show convincing pneumonia.  Patient has not been coughing and is afebrile.  Labs show very mild leukocytosis and elevated hemoglobin.  Mild AKI compared to prior and calcium is elevated.  Unsure if this is contributing to the altered mental status or not.  Will add a COVID swab given the COVID/flu/RSV in the community causing altered mental status changes.  TSH is normal.  Now as there is not evidence of stroke, will call for admission for altered mental status of unclear etiology to continue her workup.  9:27 PM Patient is COVID-positive.  She also has hypercalcemic may be contributing to some of her symptoms.  She will be admitted by medicine for further management.  They also ordered some other levels of medications to look for toxicity.  Medicine will admit.  Clinical Impression: 1. Altered mental status, unspecified altered mental status type   2. COVID     Disposition: Admit  This note was prepared with assistance of Dragon voice recognition software. Occasional wrong-word or sound-a-like substitutions may have occurred due to the inherent limitations of voice recognition software.     Aarib Pulido, Lonni PARAS, MD 12/02/23 2127

## 2023-12-02 NOTE — Assessment & Plan Note (Signed)
 Lithium and depakote levels pending.

## 2023-12-02 NOTE — Assessment & Plan Note (Addendum)
 Delirium due to COVID-19 vs hyperammonemia from depakote  vs lithium  toxicity vs seizure. MRI brain neg for stroke TSH nl Calcium of 11.6, treating, but wouldn't expect this to cause this degree of encephalopathy / neuro findings alone necessarily.   Neuro consult Stat Ammonia, depakote , and lithium  levels Treat calcium as discussed below Tele monitor

## 2023-12-02 NOTE — H&P (Signed)
 History and Physical    Patient: Paula Dickson FMW:[id number] DOB: Oct 07, 1966 DOA: 12/02/2023 DOS: the patient was seen and examined on 12/02/2023 PCP: [name]  Patient coming from: Home  Chief Complaint:  Chief Complaint  Patient presents with   Altered Mental Status   HPI: Paula Dickson is a 58 y.o. female with medical history significant of BPD, HTN, hyperthyroidism, schizophrenia.  Pt lives alone, family checked on her today. She was in bed with decreased responsiveness and tremors. Pt was last seen normal on Friday, 1/3. Pt is unable to give any hx. Pt is normally alert and oriented and ambulatory.   Review of Systems: unable to review all systems due to the inability of the patient to answer questions. Past Medical History:  Diagnosis Date   Bipolar affective disorder (HCC)    Bipolar disorder (HCC)    History of arthritis    History of chicken pox    History of depression    History of genital warts    history of heart murmur    History of high blood pressure    History of thyroid  disease    History of UTI    Hypertension    Low TSH level 07/13/2017   Schizophrenia (HCC)    Past Surgical History:  Procedure Laterality Date   ABLATION ON ENDOMETRIOSIS     CYST REMOVAL NECK     around 11 years ago /benign   MULTIPLE TOOTH EXTRACTIONS     Social History:  reports that she has never smoked. She has never used smokeless tobacco. She reports that she does not currently use alcohol . She reports that she does not currently use drugs.  No Known Allergies  Family History  Problem Relation Age of Onset   Arthritis Father    Hyperlipidemia Father    High blood pressure Father    Diabetes Sister    Diabetes Mother    Diabetes Brother    Mental illness Brother    Alcohol  abuse Paternal Uncle    Alcohol  abuse Paternal Grandfather    Breast cancer Maternal Aunt    Breast cancer Paternal Aunt    High blood pressure Sister    Mental illness Other         runs in family    Prior to Admission medications   Medication Sig Start Date End Date Taking? Authorizing Provider  benztropine  (COGENTIN ) 0.5 MG tablet Take 0.5 mg by mouth in the morning. 03/07/22  Yes [provider]  divalproex  (DEPAKOTE ) 500 MG DR tablet Take 500 mg by mouth 2 (two) times daily. 11/09/23  Yes [provider]  hydrOXYzine  (ATARAX ) 50 MG tablet Take 50 mg by mouth every 6 (six) hours as needed (agitation, sleep). 11/09/23  Yes [provider]  lithium  300 MG tablet Take 300 mg by mouth 2 (two) times daily. 11/09/23  Yes [provider]  LORazepam  (ATIVAN ) 1 MG tablet Take 2 mg by mouth every 8 (eight) hours as needed for anxiety. 01/04/22  Yes [provider]  OLANZapine  (ZYPREXA ) 20 MG tablet Take 20 mg by mouth at bedtime.   Yes [provider]  OLANZapine  (ZYPREXA ) 5 MG tablet Take 1 tablet (5 mg total) by mouth at bedtime. Patient taking differently: Take 5 mg by mouth in the morning. 08/16/22  Yes Christobal Guadalajara, MD  propranolol  (INDERAL ) 10 MG tablet Take 1 tablet (10 mg total) by mouth 2 (two) times daily. Patient taking differently: Take 10 mg by mouth 3 (three) times  daily. 08/16/22  Chaney Christobal Guadalajara, MD    Physical Exam: Vitals:   12/02/23 2030 12/02/23 2045 12/02/23 2115 12/02/23 2117  BP: 132/72  (!) 146/98 (!) 146/98  Pulse:   82 (!) 4  Resp: 16  18 15   Temp:      TempSrc:      SpO2:  100% 100% 100%  Weight:      Height:       Constitutional: lethargic, starts having tremors when stimulated Respiratory: clear to auscultation bilaterally, no wheezing, no crackles. Normal respiratory effort. No accessory muscle use.  Cardiovascular: Regular rate and rhythm, no murmurs / rubs / gallops. No extremity edema. 2+ pedal pulses. No carotid bruits.  Abdomen: no tenderness, no masses palpated. No hepatosplenomegaly. Bowel sounds positive.  Neurologic: BLE, LUE, and mouth 5 hz tremor suspicious for parkonsionian tremor  when stimulated.  Pt resists eye opening, pt has gag reflex present and appears to be guarding airway at this time.  Data Reviewed:    Labs on Admission: I have personally reviewed following labs and imaging studies  CBC: Recent Labs  Lab 12/02/23 1555  WBC 10.6*  NEUTROABS 9.0*  HGB 16.3*  HCT 49.4*  MCV 93.4  PLT 150   Basic Metabolic Panel: Recent Labs  Lab 12/02/23 1555  NA 141  K 4.1  CL 100  CO2 25  GLUCOSE 109*  BUN 25*  CREATININE 1.56*  CALCIUM 11.6*   GFR: Estimated Creatinine Clearance: 32.2 mL/min (A) (by C-G formula based on SCr of 1.56 mg/dL (H)). Liver Function Tests: Recent Labs  Lab 12/02/23 1555  AST 43*  ALT 41  ALKPHOS 204*  BILITOT 0.7  PROT 8.5*  ALBUMIN 4.2   No results for input(s): LIPASE, AMYLASE in the last 168 hours. Recent Labs  Lab 12/02/23 2104  AMMONIA <10   Coagulation Profile: No results for input(s): INR, PROTIME in the last 168 hours. Cardiac Enzymes: No results for input(s): CKTOTAL, CKMB, CKMBINDEX, TROPONINI in the last 168 hours. BNP (last 3 results) No results for input(s): PROBNP in the last 8760 hours. HbA1C: No results for input(s): HGBA1C in the last 72 hours. CBG: Recent Labs  Lab 12/02/23 1419  GLUCAP 115*   Lipid Profile: No results for input(s): CHOL, HDL, LDLCALC, TRIG, CHOLHDL, LDLDIRECT in the last 72 hours. Thyroid  Function Tests: Recent Labs    12/02/23 1511  TSH 3.325   Anemia Panel: No results for input(s): VITAMINB12, FOLATE, FERRITIN, TIBC, IRON, RETICCTPCT in the last 72 hours. Urine analysis:    Component Value Date/Time   COLORURINE YELLOW 09/21/2022 1529   APPEARANCEUR CLEAR 09/21/2022 1529   LABSPEC 1.012 09/21/2022 1529   PHURINE 5.0 09/21/2022 1529   GLUCOSEU NEGATIVE 09/21/2022 1529   HGBUR NEGATIVE 09/21/2022 1529   BILIRUBINUR NEGATIVE 09/21/2022 1529   BILIRUBINUR neg 12/27/2015 1421   KETONESUR NEGATIVE 09/21/2022 1529    PROTEINUR NEGATIVE 09/21/2022 1529   UROBILINOGEN 0.2 12/27/2015 1421   UROBILINOGEN 0.2 06/14/2015 0040   NITRITE NEGATIVE 09/21/2022 1529   LEUKOCYTESUR NEGATIVE 09/21/2022 1529    Radiological Exams on Admission: MR BRAIN WO CONTRAST Result Date: 12/02/2023 CLINICAL DATA:  Neuro deficit, acute, stroke suspected. EXAM: MRI HEAD WITHOUT CONTRAST TECHNIQUE: Multiplanar, multiecho pulse sequences of the brain and surrounding structures were obtained without intravenous contrast. COMPARISON:  Head CT 12/02/2023 and MRI 10/13/2019 FINDINGS: The study is intermittently moderately motion degraded. Brain: There is no evidence of an acute infarct, intracranial hemorrhage, mass, midline shift, or extra-axial fluid collection.  There is generalized cerebral atrophy which is mildly to moderately advanced for age. No significant white matter disease is evident. Vascular: Major intracranial vascular flow voids are preserved. Skull and upper cervical spine: Unremarkable bone marrow signal. Sinuses/Orbits: Extensive mucosal thickening in the bilateral ethmoid and left sphenoid and left maxillary sinuses with milder mucosal thickening in the other sinuses. Clear mastoid air cells. Unremarkable orbits. Other: None. IMPRESSION: 1. Motion degraded examination. No evidence of acute intracranial abnormality. 2. Mildly to moderately age advanced cerebral atrophy. 3. Paranasal sinus disease. Electronically Signed   By: Dasie Hamburg M.D.   On: 12/02/2023 18:35   DG Chest Portable 1 View Result Date: 12/02/2023 CLINICAL DATA:  Altered mental status. EXAM: PORTABLE CHEST 1 VIEW COMPARISON:  Chest radiograph dated 03/22/2021. FINDINGS: The heart size and mediastinal contours are within normal limits. Mild left basilar atelectasis/airspace disease. The right lung is clear. No pleural effusion or pneumothorax. The visualized skeletal structures are unremarkable. IMPRESSION: Mild left basilar atelectasis/airspace disease.  Electronically Signed   By: Norman Hopper M.D.   On: 12/02/2023 15:40   CT Head Wo Contrast Result Date: 12/02/2023 CLINICAL DATA:  Mental status change, unknown cause EXAM: CT HEAD WITHOUT CONTRAST TECHNIQUE: Contiguous axial images were obtained from the base of the skull through the vertex without intravenous contrast. RADIATION DOSE REDUCTION: This exam was performed according to the departmental dose-optimization program which includes automated exposure control, adjustment of the mA and/or kV according to patient size and/or use of iterative reconstruction technique. COMPARISON:  Head CT 03/18/2021 FINDINGS: Brain: No hemorrhage. No hydrocephalus. No extra-axial fluid collection. No mass effect. No mass lesion. No CT evidence of an acute cortical infarct. Vascular: No hyperdense vessel or unexpected calcification. Skull: Normal. Negative for fracture or focal lesion. Sinuses/Orbits: No middle ear or mastoid effusion. Pansinus mucosal thickening. Orbits are unremarkable. Other: None. IMPRESSION: No CT etiology for altered mental status identified Electronically Signed   By: Lyndall Gore M.D.   On: 12/02/2023 15:06    EKG: Independently reviewed.   Assessment and Plan: * Acute metabolic encephalopathy Delirium due to COVID-19 vs hyperammonemia from depakote  vs lithium  toxicity vs seizure. MRI brain neg for stroke TSH nl Calcium of 11.6, treating, but wouldn't expect this to cause this degree of encephalopathy / neuro findings alone necessarily.   Neuro consult Stat Ammonia, depakote , and lithium  levels Treat calcium as discussed below Tele monitor  COVID-19 virus infection No O2 requirement at this time, satting 100% on RA CXR = mild left basilar atelectasis vs airspace dz Supportive care  Paroxysmal atrial fibrillation (HCC) CHADS VASC score of 2 with h/o HTN in chart. No stroke on MRI today But may want to consider starting AC long term with this history. Likely need to discuss  with pt / family once more stable. Putting pt on heparin  drip for the moment Metoprolol  IV PRN rate control for RVR  AKI (acute kidney injury) (HCC) Question if dehydration? H/o not eating / drinking if she misses any antipsychotic meds (admitted her for same in 2023) IVF Strict intake and output Repeat BMP In AM  Hypercalcemia Mild hypercalcemia of 11.6 NS: 2L bolus and 125 cc/hr Repeat CMP in AM Lithium  level pending  Schizoaffective disorder, bipolar type (HCC) Lithium  and depakote  levels pending.  Hyperthyroidism TSH nl today.      Advance Care Planning:   Code Status: Full Code  Consults: Dr. Jerrie  Family Communication: No family in room  Severity of Illness: The appropriate patient status for  this patient is OBSERVATION. Observation status is judged to be reasonable and necessary in order to provide the required intensity of service to ensure the patient's safety. The patient's presenting symptoms, physical exam findings, and initial radiographic and laboratory data in the context of their medical condition is felt to place them at decreased risk for further clinical deterioration. Furthermore, it is anticipated that the patient will be medically stable for discharge from the hospital within 2 midnights of admission.   Author: Aedan Geimer M., DO 12/02/2023 9:29 PM  For on call review [email or web address].

## 2023-12-02 NOTE — Progress Notes (Signed)
 ANTICOAGULATION CONSULT NOTE - Initial Consult  Pharmacy Consult for Heparin  Indication: atrial fibrillation  No Known Allergies  Patient Measurements: Height: 5' 1 (154.9 cm) Weight: 59.7 kg (131 lb 9.8 oz) IBW/kg (Calculated) : 47.8 Heparin  Dosing Weight: 59.7 kg  Vital Signs: Temp: 97.5 F (36.4 C) (01/05 1916) Temp Source: Axillary (01/05 1916) BP: 146/98 (01/05 2117) Pulse Rate: 4 (01/05 2117)  Labs: Recent Labs    12/02/23 1555  HGB 16.3*  HCT 49.4*  PLT 150  CREATININE 1.56*    Estimated Creatinine Clearance: 32.2 mL/min (A) (by C-G formula based on SCr of 1.56 mg/dL (H)).   Medical History: Past Medical History:  Diagnosis Date   Bipolar affective disorder (HCC)    Bipolar disorder (HCC)    History of arthritis    History of chicken pox    History of depression    History of genital warts    history of heart murmur    History of high blood pressure    History of thyroid  disease    History of UTI    Hypertension    Low TSH level 07/13/2017   Schizophrenia (HCC)     Medications:  (Not in a hospital admission)  Scheduled:  Infusions:   sodium chloride  125 mL/hr at 12/02/23 2101   PRN: acetaminophen  **OR** acetaminophen , metoprolol  tartrate, ondansetron  **OR** ondansetron  (ZOFRAN ) IV  Assessment: 52 yof with a history of schizophrenia, htn, and bipolar d/o . Patient is presenting with AMS. Heparin  per pharmacy consult placed for atrial fibrillation.  Patient is not on anticoagulation prior to arrival.  Hgb 16.3; plt 150  Goal of Therapy:  Heparin  level 0.3-0.7 units/ml Monitor platelets by anticoagulation protocol: Yes   Plan:  Give IV heparin  3000 units bolus x 1 Start heparin  infusion at 950 units/hr Check anti-Xa level in 8 hours and daily while on heparin  Continue to monitor H&H and platelets  Dorn Buttner, PharmD, BCPS 12/02/2023 9:52 PM ED Clinical Pharmacist -  [phone]

## 2023-12-02 NOTE — Assessment & Plan Note (Signed)
 No O2 requirement at this time, satting 100% on RA CXR = mild left basilar atelectasis vs airspace dz Supportive care

## 2023-12-02 NOTE — Assessment & Plan Note (Signed)
 Mild hypercalcemia of 11.6 NS: 2L bolus and 125 cc/hr Repeat CMP in AM Lithium level pending

## 2023-12-03 ENCOUNTER — Observation Stay (HOSPITAL_COMMUNITY): Payer: Medicare HMO

## 2023-12-03 DIAGNOSIS — F250 Schizoaffective disorder, bipolar type: Secondary | ICD-10-CM | POA: Diagnosis present

## 2023-12-03 DIAGNOSIS — Z7401 Bed confinement status: Secondary | ICD-10-CM | POA: Diagnosis not present

## 2023-12-03 DIAGNOSIS — J690 Pneumonitis due to inhalation of food and vomit: Secondary | ICD-10-CM | POA: Diagnosis present

## 2023-12-03 DIAGNOSIS — G252 Other specified forms of tremor: Secondary | ICD-10-CM | POA: Diagnosis present

## 2023-12-03 DIAGNOSIS — I480 Paroxysmal atrial fibrillation: Secondary | ICD-10-CM | POA: Diagnosis present

## 2023-12-03 DIAGNOSIS — R5381 Other malaise: Secondary | ICD-10-CM | POA: Diagnosis present

## 2023-12-03 DIAGNOSIS — E870 Hyperosmolality and hypernatremia: Secondary | ICD-10-CM | POA: Diagnosis present

## 2023-12-03 DIAGNOSIS — N182 Chronic kidney disease, stage 2 (mild): Secondary | ICD-10-CM | POA: Diagnosis present

## 2023-12-03 DIAGNOSIS — Z818 Family history of other mental and behavioral disorders: Secondary | ICD-10-CM | POA: Diagnosis not present

## 2023-12-03 DIAGNOSIS — T56891D Toxic effect of other metals, accidental (unintentional), subsequent encounter: Secondary | ICD-10-CM | POA: Diagnosis not present

## 2023-12-03 DIAGNOSIS — T426X5A Adverse effect of other antiepileptic and sedative-hypnotic drugs, initial encounter: Secondary | ICD-10-CM | POA: Diagnosis present

## 2023-12-03 DIAGNOSIS — K7689 Other specified diseases of liver: Secondary | ICD-10-CM | POA: Diagnosis present

## 2023-12-03 DIAGNOSIS — J9811 Atelectasis: Secondary | ICD-10-CM | POA: Diagnosis present

## 2023-12-03 DIAGNOSIS — N179 Acute kidney failure, unspecified: Secondary | ICD-10-CM | POA: Diagnosis present

## 2023-12-03 DIAGNOSIS — I129 Hypertensive chronic kidney disease with stage 1 through stage 4 chronic kidney disease, or unspecified chronic kidney disease: Secondary | ICD-10-CM | POA: Diagnosis present

## 2023-12-03 DIAGNOSIS — R7989 Other specified abnormal findings of blood chemistry: Secondary | ICD-10-CM | POA: Diagnosis present

## 2023-12-03 DIAGNOSIS — I4891 Unspecified atrial fibrillation: Secondary | ICD-10-CM | POA: Diagnosis not present

## 2023-12-03 DIAGNOSIS — E860 Dehydration: Secondary | ICD-10-CM | POA: Diagnosis present

## 2023-12-03 DIAGNOSIS — Z8261 Family history of arthritis: Secondary | ICD-10-CM | POA: Diagnosis not present

## 2023-12-03 DIAGNOSIS — R569 Unspecified convulsions: Secondary | ICD-10-CM | POA: Diagnosis not present

## 2023-12-03 DIAGNOSIS — E890 Postprocedural hypothyroidism: Secondary | ICD-10-CM | POA: Diagnosis present

## 2023-12-03 DIAGNOSIS — E875 Hyperkalemia: Secondary | ICD-10-CM | POA: Diagnosis present

## 2023-12-03 DIAGNOSIS — R4182 Altered mental status, unspecified: Secondary | ICD-10-CM | POA: Diagnosis present

## 2023-12-03 DIAGNOSIS — G9341 Metabolic encephalopathy: Secondary | ICD-10-CM | POA: Diagnosis present

## 2023-12-03 DIAGNOSIS — E871 Hypo-osmolality and hyponatremia: Secondary | ICD-10-CM | POA: Diagnosis present

## 2023-12-03 DIAGNOSIS — U071 COVID-19: Secondary | ICD-10-CM | POA: Diagnosis present

## 2023-12-03 DIAGNOSIS — F05 Delirium due to known physiological condition: Secondary | ICD-10-CM | POA: Diagnosis present

## 2023-12-03 LAB — CBC
HCT: 38.8 % (ref 36.0–46.0)
Hemoglobin: 12.4 g/dL (ref 12.0–15.0)
MCH: 30.7 pg (ref 26.0–34.0)
MCHC: 32 g/dL (ref 30.0–36.0)
MCV: 96 fL (ref 80.0–100.0)
Platelets: 112 10*3/uL — ABNORMAL LOW (ref 150–400)
RBC: 4.04 MIL/uL (ref 3.87–5.11)
RDW: 15.4 % (ref 11.5–15.5)
WBC: 7.9 10*3/uL (ref 4.0–10.5)
nRBC: 0 % (ref 0.0–0.2)

## 2023-12-03 LAB — CSF CELL COUNT WITH DIFFERENTIAL
RBC Count, CSF: 8 /mm3 — ABNORMAL HIGH
RBC Count, CSF: 1 /mm3 — ABNORMAL HIGH
Tube #: 1
Tube #: 4
WBC, CSF: 1 /mm3 (ref 0–5)
WBC, CSF: 1 /mm3 (ref 0–5)

## 2023-12-03 LAB — URINALYSIS, ROUTINE W REFLEX MICROSCOPIC
Bilirubin Urine: NEGATIVE
Glucose, UA: NEGATIVE mg/dL
Hgb urine dipstick: NEGATIVE
Ketones, ur: NEGATIVE mg/dL
Leukocytes,Ua: NEGATIVE
Nitrite: NEGATIVE
Protein, ur: NEGATIVE mg/dL
Specific Gravity, Urine: 1.006 (ref 1.005–1.030)
pH: 7 (ref 5.0–8.0)

## 2023-12-03 LAB — I-STAT VENOUS BLOOD GAS, ED
Acid-Base Excess: 0 mmol/L (ref 0.0–2.0)
Bicarbonate: 25.5 mmol/L (ref 20.0–28.0)
Calcium, Ion: 1.35 mmol/L (ref 1.15–1.40)
HCT: 29 % — ABNORMAL LOW (ref 36.0–46.0)
Hemoglobin: 9.9 g/dL — ABNORMAL LOW (ref 12.0–15.0)
O2 Saturation: 98 %
Potassium: 4.1 mmol/L (ref 3.5–5.1)
Sodium: 146 mmol/L — ABNORMAL HIGH (ref 135–145)
TCO2: 27 mmol/L (ref 22–32)
pCO2, Ven: 43.7 mm[Hg] — ABNORMAL LOW (ref 44–60)
pH, Ven: 7.373 (ref 7.25–7.43)
pO2, Ven: 101 mm[Hg] — ABNORMAL HIGH (ref 32–45)

## 2023-12-03 LAB — COMPREHENSIVE METABOLIC PANEL
ALT: 29 U/L (ref 0–44)
AST: 30 U/L (ref 15–41)
Albumin: 3 g/dL — ABNORMAL LOW (ref 3.5–5.0)
Alkaline Phosphatase: 136 U/L — ABNORMAL HIGH (ref 38–126)
Anion gap: 9 (ref 5–15)
BUN: 18 mg/dL (ref 6–20)
CO2: 24 mmol/L (ref 22–32)
Calcium: 10 mg/dL (ref 8.9–10.3)
Chloride: 113 mmol/L — ABNORMAL HIGH (ref 98–111)
Creatinine, Ser: 1.3 mg/dL — ABNORMAL HIGH (ref 0.44–1.00)
GFR, Estimated: 47 mL/min — ABNORMAL LOW (ref >60)
Glucose, Bld: 71 mg/dL (ref 70–99)
Potassium: 4.5 mmol/L (ref 3.5–5.1)
Sodium: 146 mmol/L — ABNORMAL HIGH (ref 135–145)
Total Bilirubin: 0.7 mg/dL (ref 0.0–1.2)
Total Protein: 6 g/dL — ABNORMAL LOW (ref 6.5–8.1)

## 2023-12-03 LAB — RAPID URINE DRUG SCREEN, HOSP PERFORMED
Amphetamines: NOT DETECTED
Barbiturates: NOT DETECTED
Benzodiazepines: NOT DETECTED
Cocaine: NOT DETECTED
Opiates: NOT DETECTED
Tetrahydrocannabinol: NOT DETECTED

## 2023-12-03 LAB — MENINGITIS/ENCEPHALITIS PANEL (CSF)

## 2023-12-03 LAB — CK: Total CK: 463 U/L — ABNORMAL HIGH (ref 38–234)

## 2023-12-03 LAB — HIV ANTIBODY (ROUTINE TESTING W REFLEX): HIV Screen 4th Generation wRfx: NONREACTIVE

## 2023-12-03 LAB — PROTEIN AND GLUCOSE, CSF
Glucose, CSF: 47 mg/dL (ref 40–70)
Total  Protein, CSF: 48 mg/dL — ABNORMAL HIGH (ref 15–45)

## 2023-12-03 LAB — LACTIC ACID, PLASMA
Lactic Acid, Venous: 1.2 mmol/L (ref 0.5–1.9)
Lactic Acid, Venous: 0.9 mmol/L (ref 0.5–1.9)

## 2023-12-03 LAB — CORTISOL-AM, BLOOD: Cortisol - AM: 14.8 ug/dL (ref 6.7–22.6)

## 2023-12-03 MED ORDER — ENOXAPARIN SODIUM 30 MG/0.3ML IJ SOSY: 30.0000 mg | PREFILLED_SYRINGE | Freq: Every day | INTRAMUSCULAR | Status: DC

## 2023-12-03 MED ORDER — SODIUM CHLORIDE 0.9 % IV SOLN: 2.0000 g | Freq: Every day | INTRAVENOUS | Status: DC

## 2023-12-03 MED ORDER — DEXTROSE 5 % IV SOLN
10.0000 mg/kg | Freq: Two times a day (BID) | INTRAVENOUS | Status: DC
  Administered 2023-12-03 – 2023-12-04 (×2): 595 mg via INTRAVENOUS
  Filled 2023-12-03 (×6): qty 11.9

## 2023-12-03 MED ORDER — SODIUM CHLORIDE 0.9 % IV SOLN: INTRAVENOUS | Status: DC

## 2023-12-03 MED ORDER — ENOXAPARIN SODIUM 100 MG/ML IJ SOSY
90.0000 mg | PREFILLED_SYRINGE | INTRAMUSCULAR | Status: DC
  Administered 2023-12-03: 90 mg via SUBCUTANEOUS
  Filled 2023-12-03: qty 0.9

## 2023-12-03 MED ORDER — SODIUM CHLORIDE 0.9 % IV SOLN
2.0000 g | Freq: Two times a day (BID) | INTRAVENOUS | Status: DC
  Administered 2023-12-03 (×2): 2 g via INTRAVENOUS
  Filled 2023-12-03 (×2): qty 20

## 2023-12-03 MED ORDER — VANCOMYCIN HCL 1250 MG/250ML IV SOLN
1250.0000 mg | Freq: Once | INTRAVENOUS | Status: AC
  Administered 2023-12-03: 1250 mg via INTRAVENOUS
  Filled 2023-12-03 (×2): qty 250

## 2023-12-03 MED ORDER — VANCOMYCIN HCL 750 MG/150ML IV SOLN
750.0000 mg | INTRAVENOUS | Status: DC
  Filled 2023-12-03: qty 150

## 2023-12-03 MED ORDER — HEPARIN SODIUM (PORCINE) 5000 UNIT/ML IJ SOLN
5000.0000 [IU] | Freq: Three times a day (TID) | INTRAMUSCULAR | Status: DC
  Administered 2023-12-04 – 2023-12-14 (×32): 5000 [IU] via SUBCUTANEOUS
  Filled 2023-12-03 (×31): qty 1

## 2023-12-03 MED ORDER — LIDOCAINE 1 % OPTIME INJ - NO CHARGE
5.0000 mL | Freq: Once | INTRAMUSCULAR | Status: AC
  Administered 2023-12-03: 2 mL via INTRADERMAL

## 2023-12-03 NOTE — Procedures (Signed)
 PROCEDURE SUMMARY:  Successful image-guided lumbar puncture at level of L2-3.  Opening pressure 14 cm water .  Yielded 12 mL of clear, colorless  CSF.  No immediate complications.  EBL = trace. Patient tolerated well.   Specimen was sent for labs.  Please see imaging section of Epic for full dictation.   Post procedure orders placed.  - Bedrest with bathroom privileges for 4 hours  - Remain flat in the bed for rest of the day  - Tylenol  for headache, up to 4,000 mg a day   Tasnia Spegal H Erdem Naas PA-C 12/03/2023 2:35 PM

## 2023-12-03 NOTE — Procedures (Signed)
 Patient Name: Paula Dickson  MRN: [id number]  Epilepsy Attending: Arlin MALVA Krebs  Referring Physician/Provider: Lonzell Emeline HERO, DO  Date: 12/03/2023 Duration: 25.40 mins  Patient history: 58yo F with ams getting eeg to evaluate for seizure  Level of alertness: Awake,/lethargic  AEDs during EEG study: None  Technical aspects: This EEG study was done with scalp electrodes positioned according to the 10-20 International system of electrode placement. Electrical activity was reviewed with band pass filter of 1-70Hz , sensitivity of 7 uV/mm, display speed of 66mm/sec with a 60Hz  notched filter applied as appropriate. EEG data were recorded continuously and digitally stored.  Video monitoring was available and reviewed as appropriate.  Description: No clear posterior dominant rhythm was seen. EEG showed continuous generalized polymorphic sharply contoured 3 to 6 Hz theta-delta slowing, at times with triphasic morphology.  Hyperventilation and photic stimulation were not performed.     ABNORMALITY - Continuous slow, generalized  IMPRESSION: This study is suggestive of moderate diffuse encephalopathy likely related to toxic-metabolic causes. No seizures or definite epileptiform discharges were seen throughout the recording.  Paula Dickson O Ragina Fenter

## 2023-12-03 NOTE — Progress Notes (Signed)
 EEG complete - results pending

## 2023-12-03 NOTE — Progress Notes (Signed)
 PHARMACY - ANTICOAGULATION CONSULT NOTE  Pharmacy Consult for IV heparin  >>> Enoxaparin   Indication: atrial fibrillation  No Known Allergies  Patient Measurements: Height: 5' 1 (154.9 cm) Weight: 59.7 kg (131 lb 9.8 oz) IBW/kg (Calculated) : 47.8 Heparin  Dosing Weight: 59.7 kg  Vital Signs: Temp: 97.5 F (36.4 C) (01/05 1916) Temp Source: Axillary (01/05 1916) BP: 127/79 (01/05 2245) Pulse Rate: 61 (01/05 2245)  Labs: Recent Labs    12/02/23 1555  HGB 16.3*  HCT 49.4*  PLT 150  CREATININE 1.56*    Estimated Creatinine Clearance: 32.2 mL/min (A) (by C-G formula based on SCr of 1.56 mg/dL (H)).   Medical History: Past Medical History:  Diagnosis Date   Bipolar affective disorder (HCC)    Bipolar disorder (HCC)    History of arthritis    History of chicken pox    History of depression    History of genital warts    history of heart murmur    History of high blood pressure    History of thyroid  disease    History of UTI    Hypertension    Low TSH level 07/13/2017   Schizophrenia (HCC)    Assessment: 89 yof with a history of schizophrenia, htn, and bipolar d/o . Patient is presenting with AMS and unable to take oral medications at this time. No PTA AC. Heparin  per pharmacy consult placed for atrial fibrillation initially, but heparin  drip not yet started d/t IV access. Pharmacy consulted to start therapeutic enoxaparin .  Goal of Therapy:  Anti-Xa level 0.6-1 units/ml 4hrs after LMWH dose given Monitor platelets by anticoagulation protocol: Yes   Plan:  Start enoxaparin  90 mg q24h (1.5mg /kg)  F/u renal function for dose adjustment  F/u plans for transition to oral anticoagulation  Anti-xa levels as needed   Thank you for allowing pharmacy to participate in this patient's care.  Leonor GORMAN Bash, PharmD Emergency Medicine Clinical Pharmacist 12/03/2023,12:05 AM

## 2023-12-03 NOTE — ED Notes (Signed)
 Admitting provider turned off the bair hugger while in room, rectal temp checked again & bair hugger reapplied.

## 2023-12-03 NOTE — ED Notes (Signed)
 EEG at bedside.

## 2023-12-03 NOTE — ED Notes (Signed)
Applied bair hugger ?

## 2023-12-03 NOTE — Progress Notes (Addendum)
 [facility]   NOTE Paula Dickson FMW:[id number]  DOB: 1966/08/20  DOA: 12/02/2023  PCP: [facility]  12/03/2023,7:22 AM   LOS: 0 days      Code Status: Full code From: Home  current Dispo: Unclear     58 year old black female known bipolar schizophrenia schizoaffective Hypothyroidism causing sinus tachycardia supposed to be on Tapazole  Previous admissions for AKI with hypernatremia Prior suicidality with valproate level toxicity-also has had lithium  toxicity previously Has been hospitalized several x 2021 and 2023 for psychiatric illness  141 potassium 4.1 BUN/creatinine 25/1.5 AST/ALT 43/41--WBC 10.6 hemoglobin 16 platelet 150--TSH 3.3  Lithium  level a little bit up 1.2 valproic acid  less than 10 Coincidental COVID-positive--UA negative   A-fib suspected but she is mainly in sinus tach  Neurologist consulted on admission  Events 1/5 brought from home unresponsive full body tremors unable to speak or follow commands and acting erratically--persistently hypothermic and hypotensive    procedures 1/5--CXR basilar atelectasis airspace disease, MRI brain degraded exam no stroke-CT head no stroke   Plan  Concern for sepsis differential diagnosis lithium  toxicity/malignant neuroleptic syndrome Unresponsive spell Etiology unclear-would cover empirically for meningitis with vancomycin  and ceftriaxone  and acyclovir  Will order LP --Blood culture X2 obtained-- continue NS 125 cc/H Lithium  toxicity is the main concern that she has not been eating drinking lately and although she has not taken her meds this could have built up in her system Check lactic acid--- x-ray, urine clear, HIV negative COVID positive Mild leukocytosis --asymptomatic She has no respiratory findings is not on oxygen-workup as above Sinus tachycardia versus atrial fibrillation On monitor is only sporadic and she is in sinus predominantly, I only see mainly artifact Change Lovenox  to subcu heparin  for now and monitor  trends Can use IV metoprolol  for as needed sinus tach as she is lethargic Schizoaffective bipolar schizophrenia Benztropine  0.5 Depakote  500 twice daily hydroxyzine  50 every 6 lithium  300 twice daily Ativan  2 Zyprexa  20 at bedtime Zyprexa  5 daily all on hold Needs to be much more awake before resume Hypothyroid in the setting of previous thyroidectomy on Tapazole  supposedly Not on any replacement at home TSH 3.3 this admission Mild hypernatremia Volume depletion with AKI on admission superimposed on CKD 2 Continue fluids as per above  Called sister Winton and updated [phone]--at home she is usually up and about but has made some changes and just wants to lay in the bed all the time for some time--she last walked ~ nov 2024 She has been refusing ALL of her meds the last couple of days--had a couple friends dying and a funeral was scheduled on 12/04/23  DVT prophylaxis: Heparin   Status is: Observation The patient will require care spanning > 2 midnights and should be moved to inpatient because:   Sick    Subjective: Nonresponsive slightly cool to touch core temperature is 93 She is drooling and she turns her head to me in response to my questions but does not really want to open her eyes and is tremulous  Objective + exam Vitals:   12/03/23 0307 12/03/23 0308 12/03/23 0330 12/03/23 0515  BP:   (!) 158/110 124/72  Pulse: 72 71 77 76  Resp: 18 13 18 16   Temp:   (!) 97 F (36.1 C)   TempSrc:   Oral   SpO2: 99% 100%  100%  Weight:      Height:       Filed Weights   12/02/23 1414  Weight: 59.7 kg    Examination:  Somewhat lethargic not obtunded but unable to answer questions Cannot access pupils she keeps them tightly shut Thick neck thick tongue S1-S2 no murmur Abdomen obese nontender S1-S2 no murmur seems sinus, sinus tach Cannot assess neuro  Data Reviewed: reviewed   CBC    Component Value Date/Time   WBC 7.9 12/03/2023 0450   RBC 4.04 12/03/2023 0450    HGB 12.4 12/03/2023 0450   HCT 38.8 12/03/2023 0450   PLT 112 (L) 12/03/2023 0450   MCV 96.0 12/03/2023 0450   MCH 30.7 12/03/2023 0450   MCHC 32.0 12/03/2023 0450   RDW 15.4 12/03/2023 0450   LYMPHSABS 0.6 (L) 12/02/2023 1555   MONOABS 0.9 12/02/2023 1555   EOSABS 0.0 12/02/2023 1555   BASOSABS 0.0 12/02/2023 1555      Latest Ref Rng & Units 12/03/2023    4:50 AM 12/03/2023    2:51 AM 12/02/2023    3:55 PM  CMP  Glucose 70 - 99 mg/dL 71   890   BUN 6 - 20 mg/dL 18   25   Creatinine 9.55 - 1.00 mg/dL 8.69   8.43   Sodium 864 - 145 mmol/L 146  146  141   Potassium 3.5 - 5.1 mmol/L 4.5  4.1  4.1   Chloride 98 - 111 mmol/L 113   100   CO2 22 - 32 mmol/L 24   25   Calcium 8.9 - 10.3 mg/dL 89.9   88.3   Total Protein 6.5 - 8.1 g/dL 6.0   8.5   Total Bilirubin 0.0 - 1.2 mg/dL 0.7   0.7   Alkaline Phos 38 - 126 U/L 136   204   AST 15 - 41 U/L 30   43   ALT 0 - 44 U/L 29   41     Scheduled Meds:  heparin  injection (subcutaneous)  5,000 Units Subcutaneous Q8H   Continuous Infusions:  sodium chloride      acyclovir      cefTRIAXone  (ROCEPHIN )  IV     vancomycin       Time  60  Colen Grimes, MD  [facility]

## 2023-12-03 NOTE — Progress Notes (Signed)
 Pharmacy Antibiotic Note  Paula Dickson is a 58 y.o. female admitted on 12/02/2023 with bacteremia, possible meningitis.  Pharmacy has been consulted for vancomycin  and acyclovir  dosing.  Plan: Give vancomycin  1250mg  IV x1 as loading dose, then vancomycin  750mg  IV every 24 hours. Goal trough 15-20 mcg/mL in the setting of possible meningitis. Start acyclovir  10 mg/kg IV Q12H - Continue NS @ 125 mL/min Increase ceftriaxone  to 2g IV Q12H for CNS dosing F/u cultures, LP, clinical progression, and renal function  Height: 5' 1 (154.9 cm) Weight: 59.7 kg (131 lb 9.8 oz) IBW/kg (Calculated) : 47.8  Temp (24hrs), Avg:96.1 F (35.6 C), Min:93.2 F (34 C), Max:98.1 F (36.7 C)  Recent Labs  Lab 12/02/23 1555 12/03/23 0450  WBC 10.6* 7.9  CREATININE 1.56* 1.30*    Estimated Creatinine Clearance: 38.7 mL/min (A) (by C-G formula based on SCr of 1.3 mg/dL (H)).    No Known Allergies  Thank you for allowing pharmacy to be a part of this patient's care.  Harlene VEAR Mandril 12/03/2023 1:08 PM

## 2023-12-03 NOTE — ED Notes (Signed)
 Phlebotomy at bedside.

## 2023-12-03 NOTE — Consult Note (Signed)
 NEUROLOGY CONSULT NOTE   Date of service: December 03, 2023 Patient Name: Paula Dickson MRN:  [id number] DOB:  05-Sep-1966 Chief Complaint: altered mental status Requesting Provider: Lonzell Emeline HERO, DO History obtained from chart, no family at bedside, patient altered   History of Present Illness  Paula Dickson is a 58 y.o. 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  Was found at 1 PM today with decreased responsiveness and tremors.  ED workup notable for positive COVID test, mild AKI  There was some concern for hypotension in the ED while asleep, but on my evaluation with repositioning of the blood pressure cuff her MAP was adequate; there was also concern for severe hypertension while awake, but this seem to be artifactual and related to her tremors  She has a known history of atrial fibrillation, per prior discharge summary not started on anticoagulation secondary to low CHA2DS2-VASc score but based on history of hypertension and gender has a CHA2DS2-VASc score of 2  ROS  Unable to assess secondary to patient's mental status   Past History   Past Medical History:  Diagnosis Date   Bipolar affective disorder (HCC)    Bipolar disorder (HCC)    History of arthritis    History of chicken pox    History of depression    History of genital warts    history of heart murmur    History of high blood pressure    History of thyroid  disease    History of UTI    Hypertension    Low TSH level 07/13/2017   Schizophrenia (HCC)     Past Surgical History:  Procedure Laterality Date   ABLATION ON ENDOMETRIOSIS     CYST REMOVAL NECK     around 11 years ago /benign   MULTIPLE TOOTH EXTRACTIONS       Family History: Family History  Problem Relation Age of Onset   Arthritis Father    Hyperlipidemia Father    High blood pressure Father    Diabetes Sister    Diabetes Mother    Diabetes Brother    Mental illness Brother    Alcohol  abuse Paternal Uncle    Alcohol  abuse Paternal Grandfather    Breast cancer Maternal Aunt    Breast cancer Paternal Aunt    High blood pressure Sister    Mental illness Other        runs in family    Social History  reports that she has never smoked. She has never used smokeless tobacco. She reports that she does not currently use alcohol . She reports that she does not currently use drugs.  No Known Allergies  Medications   Current Facility-Administered Medications:    0.9 %  sodium chloride  infusion, , Intravenous, Continuous, Lonzell Emeline HERO, DO, Last Rate: 125 mL/hr at 12/02/23 2101, New Bag at 12/02/23 2101   acetaminophen  (TYLENOL ) tablet 650 mg, 650 mg, Oral, Q6H PRN **OR** acetaminophen  (TYLENOL ) suppository 650 mg, 650 mg, Rectal, Q6H PRN, Lonzell, Jared M, DO   enoxaparin  (LOVENOX ) injection 90 mg, 90 mg, Subcutaneous, Q24H, Lonzell, Jared M, DO, 90 mg at 12/03/23 0025   metoprolol  tartrate (LOPRESSOR ) injection 5 mg, 5 mg, Intravenous, Q6H PRN, Lonzell, Jared M, DO   ondansetron  (ZOFRAN ) tablet 4 mg, 4 mg,  Oral, Q6H PRN **OR** ondansetron  (ZOFRAN ) injection 4 mg, 4 mg, Intravenous, Q6H PRN, Lonzell Emeline HERO, DO  Current Outpatient Medications:    benztropine  (COGENTIN ) 0.5 MG tablet, Take 0.5 mg by mouth in the morning., Disp: , Rfl:    divalproex  (DEPAKOTE ) 500 MG DR tablet, Take 500 mg by mouth 2 (two) times daily., Disp: , Rfl:    hydrOXYzine  (ATARAX ) 50 MG tablet, Take 50 mg by mouth every 6 (six) hours as needed (agitation, sleep)., Disp: , Rfl:    lithium  300 MG tablet, Take 300 mg by mouth 2 (two) times daily., Disp: , Rfl:    LORazepam  (ATIVAN ) 1 MG tablet, Take 2 mg by mouth every 8 (eight) hours as needed for anxiety., Disp:  , Rfl:    OLANZapine  (ZYPREXA ) 20 MG tablet, Take 20 mg by mouth at bedtime., Disp: , Rfl:    OLANZapine  (ZYPREXA ) 5 MG tablet, Take 1 tablet (5 mg total) by mouth at bedtime. (Patient taking differently: Take 5 mg by mouth in the morning.), Disp: , Rfl:    propranolol  (INDERAL ) 10 MG tablet, Take 1 tablet (10 mg total) by mouth 2 (two) times daily. (Patient taking differently: Take 10 mg by mouth 3 (three) times daily.), Disp: , Rfl:   Vitals   Current vital signs: BP (!) 133/97   Pulse 71   Temp 98.1 F (36.7 C) (Oral)   Resp 13   Ht 5' 1 (1.549 m)   Wt 59.7 kg   SpO2 100%   BMI 24.87 kg/m  Vital signs in last 24 hours: Temp:  [97.5 F (36.4 C)-98.1 F (36.7 C)] 98.1 F (36.7 C) (01/06 0233) Pulse Rate:  [4-126] 71 (01/06 0308) Resp:  [10-25] 13 (01/06 0308) BP: (64-173)/(41-110) 133/97 (01/06 0303) SpO2:  [90 %-100 %] 100 % (01/06 0308) Weight:  [59.7 kg] 59.7 kg (01/05 1414)    Body mass index is 24.87 kg/m.  Physical Exam   Constitutional: Appears comfortable when sleeping; appears to have some sleep apnea Psych: Minimally interactive Eyes: No scleral injection.  HENT: No OP obstruction.  Head: Normocephalic.  Cardiovascular: Normal rate and regular rhythm.  Respiratory: Effort normal, non-labored breathing.  GI: Soft.  No distension. There is no tenderness.    Neurologic Examination    Neuro: Mental Status: Opens eyes to tactile stimulation.  Does not verbalize.  Does not follow any commands. Cranial Nerves: II: Pupils are equal round and reactive to light.  Initially while sleeping 1.5 mm to 1 mm, when more awake, 2 mm to 1 mm III,IV, VI/VIII: EOMI to VOR and does orient to examiner's voice bilaterally V/VII: Facial sensation is symmetric to eyelash brush X/XI: Intact cough/gag XII: Unable to assess tongue protrusion secondary to patient's mental status  Motor/Sensory: When sleeping she has no abnormal movements.  On awakening she has a 3 to 5 Hz  tremor involving all 4 extremities, left upper extremity more than the right, bilateral legs, and chin/tongue Deep Tendon Reflexes: 2+ and symmetric patellar reflexes (tremors may mask some hyperreflexia, but certainly reflexes were not hypoactive; no clear clonus) Plantars: Toes downgoing bilaterally Cerebellar: Unable to assess secondary to patient's mental status    Labs/Imaging/Neurodiagnostic studies    Basic Metabolic Panel: Recent Labs  Lab 12/02/23 1555 12/03/23 0251  NA 141 146*  K 4.1 4.1  CL 100  --   CO2 25  --   GLUCOSE 109*  --   BUN 25*  --   CREATININE 1.56*  --  CALCIUM 11.6*  --    Mild AKI, baseline creatinine 1.2    Latest Reference Range & Units 12/02/23 15:55  Alkaline Phosphatase 38 - 126 U/L 204 (H)  Albumin 3.5 - 5.0 g/dL 4.2  AST 15 - 41 U/L 43 (H)  ALT 0 - 44 U/L 41  Total Protein 6.5 - 8.1 g/dL 8.5 (H)  (H): Data is abnormally high  CBC: Recent Labs  Lab 12/02/23 1555 12/03/23 0251  WBC 10.6*  --   NEUTROABS 9.0*  --   HGB 16.3* 9.9*  HCT 49.4* 29.0*  MCV 93.4  --   PLT 150  --     Coagulation Studies: No results for input(s): LABPROT, INR in the last 72 hours.    Latest Reference Range & Units 12/02/23 21:04 12/02/23 21:15  Lithium  0.60 - 1.20 mmol/L  1.28 (H)  Valproic Acid ,S 50.0 - 100.0 ug/mL <10 (L)   (H): Data is abnormally high (L): Data is abnormally low   Latest Reference Range & Units 12/02/23 15:11  TSH 0.350 - 4.500 uIU/mL 3.325    Latest Reference Range & Units 12/02/23 21:04  Ammonia 9 - 35 umol/L <10    Urine Drug Screen:  Pending  Head CT  personally reviewed, agree with radiology, no acute intracranial process   MRI Brain(Personally reviewed): 1. Motion degraded examination. No evidence of acute intracranial abnormality. 2. Mildly to moderately age advanced cerebral atrophy. 3. Paranasal sinus disease.  Neurodiagnostics rEEG:  pending  ASSESSMENT   This is a patient presenting with  altered mental status of unclear etiology.   Her exam is most notable for a highly encephalopathic patient with low-frequency 3 to 5 Hz tremors that resolve with rest and appear to worsen with movement.  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  Often these tremors can be improved by stopping lithium  but at times the movement disorder is irreversible; may be helpful to see if movements improve with holding lithium   Toxic/metabolic factors that could be contributing to her encephalopathy include  mild hypercalcemia,  liver dysfunction with elevated alk phos and mild the elevated AST noted Acute kidney injury Mild lithium  elevation COVID-19 infection  Psychiatric issues could also be contributing to her reduced responsiveness although this would be a diagnosis of exclusion  RECOMMENDATIONS   #Altered mental status - Lithium  level, collected on my recommendation, mildly elevated at 1.28 - Depakote  level, collected on my recommendation, undetectable - Check CK - Hold lithium  and Depakote  at this time - Continue supportive care - Agree with routine EEG was ordered by primary team - Neurology will follow along  #Atrial fibrillation - Would recommend anticoagulation for stroke prevention unless there is a clear medical  contraindication  Discussed with primary team Dr. Lonzell via secure chat and in person ______________________________________________________________________  Bonney Lola Jernigan MD-PhD [facility] [phone] Available 7  PM to 7 AM, outside of these hours please call Neurologist on call as listed on Amion.

## 2023-12-03 NOTE — ED Notes (Signed)
 Bair hugger was unplugged by EEG while at bedside, was reapplied by this Char[employer] fundraiser.

## 2023-12-03 NOTE — Progress Notes (Signed)
   12/03/23 1535  TOC Brief Assessment  Insurance and Status Reviewed ([employer] HMO)  Patient has primary care physician Yes [facility]  Provider Role General  Office Phone [phone])  Home environment has been reviewed From home  Prior level of function: independent  Prior/Current Home Services No current home services  Social Drivers of Health Review SDOH reviewed no interventions necessary  Readmission risk has been reviewed Yes (N/A Listed)  Transition of care needs no transition of care needs at this time   [facility] will continue to follow patient for any additional discharge needs

## 2023-12-03 NOTE — ED Notes (Signed)
 ED TO INPATIENT HANDOFF REPORT  ED Nurse Name and Phone #: Carlyon GLADE RN [phone]  S Name/Age/Gender Paula Dickson 58 y.o. female Room/Bed: 001C/001C  Code Status   Code Status: Full Code  Home/SNF/Other Home Disoriented x4, nonverbal since arrival to ED Is this baseline? No   Triage Complete: Triage complete  Chief Complaint Acute metabolic encephalopathy [G93.41]  Triage Note Pt coming in from home. Her sister found her laying in her bed unresponsive at about 1300. Pt was completely unresponsive with full body tremors when ems arrived. Pt has been unable to speak or follow commands. Pt sister found her in bed no signs of head trauma. Pt last known well Friday 8pm. Yesterday pt was acting erratic. Pt has a psych history.   Ems  Bp 142 palp Hr 120  98% ra  Cbg 140    Allergies No Known Allergies  Level of Care/Admitting Diagnosis ED Disposition     ED Disposition  Admit   Condition  --   Comment  Hospital Area: [facility] [[id number]]  Level of Care: Progressive [102]  Admit to Progressive based on following criteria: NEUROLOGICAL AND NEUROSURGICAL complex patients with significant risk of instability, who do not meet ICU criteria, yet require close observation or frequent assessment (< / = every 2 - 4 hours) with medical / nursing intervention.  Admit to Progressive based on following criteria: ACUTE MENTAL DISORDER-RELATED Drug/Alcohol  Ingestion/Overdose/Withdrawal, Suicidal Ideation/attempt requiring safety sitter and < Q2h monitoring/assessments, moderate to severe agitation that is managed with medication/sitter, CIWA-Ar score < 20.  May place patient in observation at [facility] or [facility] if equivalent level of care is available:: No  Covid Evaluation: Confirmed COVID Positive  Diagnosis: Acute metabolic encephalopathy [8430220]  Admitting Physician: LONZELL EMELINE HERO [[id number]]  Attending Physician: LONZELL EMELINE HERO [[id number]]           B Medical/Surgery History Past Medical History:  Diagnosis Date   Bipolar affective disorder (HCC)    Bipolar disorder (HCC)    History of arthritis    History of chicken pox    History of depression    History of genital warts    history of heart murmur    History of high blood pressure    History of thyroid  disease    History of UTI    Hypertension    Low TSH level 07/13/2017   Schizophrenia (HCC)    Past Surgical History:  Procedure Laterality Date   ABLATION ON ENDOMETRIOSIS     CYST REMOVAL NECK     around 11 years ago /benign   MULTIPLE TOOTH EXTRACTIONS       A IV Location/Drains/Wounds Patient Lines/Drains/Airways Status     Active Line/Drains/Airways     Name Placement date Placement time Site Days   Peripheral IV 12/02/23 20 G Anterior;Right Forearm 12/02/23  1557  Forearm  1            Intake/Output Last 24 hours No intake or output data in the 24 hours ending 12/03/23 1302  Labs/Imaging Results for orders placed or performed during the hospital encounter of 12/02/23 (from the past 48 hours)  CBG monitoring, ED     Status: Abnormal   Collection Time: 12/02/23  2:19 PM  Result Value Ref Range   Glucose-Capillary 115 (H) 70 - 99 mg/dL    Comment: Glucose reference range applies only to samples taken after fasting for at least 8 hours.   Comment 1 Notify RN  Comment 2 Document in Chart   TSH     Status: None   Collection Time: 12/02/23  3:11 PM  Result Value Ref Range   TSH 3.325 0.350 - 4.500 uIU/mL    Comment: Performed by a 3rd Generation assay with a functional sensitivity of <=0.01 uIU/mL. Performed at [facility], [address] [place], [place] [address]   CBC with Differential/Platelet     Status: Abnormal   Collection Time: 12/02/23  3:55 PM  Result Value Ref Range   WBC 10.6 (H) 4.0 - 10.5 K/uL   RBC 5.29 (H) 3.87 - 5.11 MIL/uL   Hemoglobin 16.3 (H) 12.0 - 15.0 g/dL   HCT 50.5 (H) 63.9 - 53.9 %   MCV 93.4 80.0 - 100.0  fL   MCH 30.8 26.0 - 34.0 pg   MCHC 33.0 30.0 - 36.0 g/dL   RDW 85.1 88.4 - 84.4 %   Platelets 150 150 - 400 K/uL   nRBC 0.0 0.0 - 0.2 %   Neutrophils Relative % 85 %   Neutro Abs 9.0 (H) 1.7 - 7.7 K/uL   Lymphocytes Relative 6 %   Lymphs Abs 0.6 (L) 0.7 - 4.0 K/uL   Monocytes Relative 9 %   Monocytes Absolute 0.9 0.1 - 1.0 K/uL   Eosinophils Relative 0 %   Eosinophils Absolute 0.0 0.0 - 0.5 K/uL   Basophils Relative 0 %   Basophils Absolute 0.0 0.0 - 0.1 K/uL   Immature Granulocytes 0 %   Abs Immature Granulocytes 0.04 0.00 - 0.07 K/uL    Comment: Performed at [facility], [address] [place], [place] [address]  Comprehensive metabolic panel     Status: Abnormal   Collection Time: 12/02/23  3:55 PM  Result Value Ref Range   Sodium 141 135 - 145 mmol/L   Potassium 4.1 3.5 - 5.1 mmol/L   Chloride 100 98 - 111 mmol/L   CO2 25 22 - 32 mmol/L   Glucose, Bld 109 (H) 70 - 99 mg/dL    Comment: Glucose reference range applies only to samples taken after fasting for at least 8 hours.   BUN 25 (H) 6 - 20 mg/dL   Creatinine, Ser 8.43 (H) 0.44 - 1.00 mg/dL   Calcium 88.3 (H) 8.9 - 10.3 mg/dL   Total Protein 8.5 (H) 6.5 - 8.1 g/dL   Albumin 4.2 3.5 - 5.0 g/dL   AST 43 (H) 15 - 41 U/L   ALT 41 0 - 44 U/L   Alkaline Phosphatase 204 (H) 38 - 126 U/L   Total Bilirubin 0.7 0.0 - 1.2 mg/dL   GFR, Estimated 38 (L) >60 mL/min    Comment: (NOTE) Calculated using the CKD-EPI Creatinine Equation (2021)    Anion gap 16 (H) 5 - 15    Comment: Performed at [facility], [address], [place], [place] [address]  Resp panel by RT-PCR (RSV, Flu A&B, Covid) Anterior Nasal Swab     Status: Abnormal   Collection Time: 12/02/23  7:50 PM   Specimen: Anterior Nasal Swab  Result Value Ref Range   SARS Coronavirus 2 by RT PCR POSITIVE (A) NEGATIVE   Influenza A by PCR NEGATIVE NEGATIVE   Influenza B by PCR NEGATIVE NEGATIVE    Comment: (NOTE) The Xpert Xpress SARS-CoV-2/FLU/RSV plus  assay is intended as an aid in the diagnosis of influenza from Nasopharyngeal swab specimens and should not be used as a sole basis for treatment. Nasal  washings and aspirates are unacceptable for Xpert Xpress SARS-CoV-2/FLU/RSV testing.  Fact Sheet for Patients: bloggercourse.com  Fact Sheet for Healthcare Providers: seriousbroker.it  This test is not yet approved or cleared by the [place]  FDA and has been authorized for detection and/or diagnosis of SARS-CoV-2 by FDA under an Emergency Use Authorization (EUA). This EUA will remain in effect (meaning this test can be used) for the duration of the COVID-19 declaration under Section 564(b)(1) of the Act, 21 U.S.C. section 360bbb-3(b)(1), unless the authorization is terminated or revoked.     Resp Syncytial Virus by PCR NEGATIVE NEGATIVE    Comment: (NOTE) Fact Sheet for Patients: bloggercourse.com  Fact Sheet for Healthcare Providers: seriousbroker.it  This test is not yet approved or cleared by the [place]  FDA and has been authorized for detection and/or diagnosis of SARS-CoV-2 by FDA under an Emergency Use Authorization (EUA). This EUA will remain in effect (meaning this test can be used) for the duration of the COVID-19 declaration under Section 564(b)(1) of the Act, 21 U.S.C. section 360bbb-3(b)(1), unless the authorization is terminated or revoked.  Performed at [facility], [address] [place], [place] [address]   Valproic acid  level     Status: Abnormal   Collection Time: 12/02/23  9:04 PM  Result Value Ref Range   Valproic Acid  Lvl <10 (L) 50.0 - 100.0 ug/mL    Comment: RESULT CONFIRMED BY MANUAL DILUTION Performed at [facility], [address], [place], [place] [address]   Ammonia     Status: None   Collection Time: 12/02/23  9:04 PM  Result Value Ref Range   Ammonia <10 9 - 35  umol/L    Comment: Performed at [facility], [address], [place], [place] [address]  Lithium  level     Status: Abnormal   Collection Time: 12/02/23  9:15 PM  Result Value Ref Range   Lithium  Lvl 1.28 (H) 0.60 - 1.20 mmol/L    Comment: Performed at [facility], [address] [place], [place] [address]  I-Stat venous blood gas, [facility], [facility], [facility])     Status: Abnormal   Collection Time: 12/03/23  2:51 AM  Result Value Ref Range   pH, Ven 7.373 7.25 - 7.43   pCO2, Ven 43.7 (L) 44 - 60 mmHg   pO2, Ven 101 (H) 32 - 45 mmHg   Bicarbonate 25.5 20.0 - 28.0 mmol/L   TCO2 27 22 - 32 mmol/L   O2 Saturation 98 %   Acid-Base Excess 0.0 0.0 - 2.0 mmol/L   Sodium 146 (H) 135 - 145 mmol/L   Potassium 4.1 3.5 - 5.1 mmol/L   Calcium, Ion 1.35 1.15 - 1.40 mmol/L   HCT 29.0 (L) 36.0 - 46.0 %   Hemoglobin 9.9 (L) 12.0 - 15.0 g/dL   Sample type VENOUS   HIV Antibody (routine testing w rflx)     Status: None   Collection Time: 12/03/23  4:50 AM  Result Value Ref Range   HIV Screen 4th Generation wRfx Non Reactive Non Reactive    Comment: Performed at [facility], [address], [place], [place] [address]  CBC     Status: Abnormal   Collection Time: 12/03/23  4:50 AM  Result Value Ref Range   WBC 7.9 4.0 - 10.5 K/uL   RBC 4.04 3.87 - 5.11 MIL/uL   Hemoglobin 12.4 12.0 - 15.0 g/dL   HCT 61.1 63.9 - 53.9 %  MCV 96.0 80.0 - 100.0 fL   MCH 30.7 26.0 - 34.0 pg   MCHC 32.0 30.0 - 36.0 g/dL   RDW 84.5 88.4 - 84.4 %   Platelets 112 (L) 150 - 400 K/uL    Comment: REPEATED TO VERIFY   nRBC 0.0 0.0 - 0.2 %    Comment: Performed at [facility], [address], [place], [place] [address]  Comprehensive metabolic panel     Status: Abnormal   Collection Time: 12/03/23  4:50 AM  Result Value Ref Range   Sodium 146 (H) 135 - 145 mmol/L   Potassium 4.5 3.5 - 5.1 mmol/L   Chloride 113 (H) 98 - 111 mmol/L   CO2 24 22 - 32 mmol/L   Glucose, Bld 71 70 - 99 mg/dL    Comment:  Glucose reference range applies only to samples taken after fasting for at least 8 hours.   BUN 18 6 - 20 mg/dL   Creatinine, Ser 8.69 (H) 0.44 - 1.00 mg/dL   Calcium 89.9 8.9 - 89.6 mg/dL   Total Protein 6.0 (L) 6.5 - 8.1 g/dL   Albumin 3.0 (L) 3.5 - 5.0 g/dL   AST 30 15 - 41 U/L   ALT 29 0 - 44 U/L   Alkaline Phosphatase 136 (H) 38 - 126 U/L   Total Bilirubin 0.7 0.0 - 1.2 mg/dL   GFR, Estimated 47 (L) >60 mL/min    Comment: (NOTE) Calculated using the CKD-EPI Creatinine Equation (2021)    Anion gap 9 5 - 15    Comment: Performed at [facility], [address], [place], [place] [address]  CK     Status: Abnormal   Collection Time: 12/03/23  4:50 AM  Result Value Ref Range   Total CK 463 (H) 38 - 234 U/L    Comment: Performed at [facility], [address], [place], [place] [address]  Cortisol-am, blood     Status: None   Collection Time: 12/03/23  4:50 AM  Result Value Ref Range   Cortisol - AM 14.8 6.7 - 22.6 ug/dL    Comment: Performed at [facility], [address], [place], [place] [address]  Urinalysis, Routine w reflex microscopic -Urine, Catheterized     Status: Abnormal   Collection Time: 12/03/23  5:19 AM  Result Value Ref Range   Color, Urine STRAW (A) YELLOW   APPearance CLEAR CLEAR   Specific Gravity, Urine 1.006 1.005 - 1.030   pH 7.0 5.0 - 8.0   Glucose, UA NEGATIVE NEGATIVE mg/dL   Hgb urine dipstick NEGATIVE NEGATIVE   Bilirubin Urine NEGATIVE NEGATIVE   Ketones, ur NEGATIVE NEGATIVE mg/dL   Protein, ur NEGATIVE NEGATIVE mg/dL   Nitrite NEGATIVE NEGATIVE   Leukocytes,Ua NEGATIVE NEGATIVE    Comment: Performed at [facility], [address], [place], [place] [address]  Rapid urine drug screen (hospital performed)     Status: None   Collection Time: 12/03/23  5:19 AM  Result Value Ref Range   Opiates NONE DETECTED NONE DETECTED   Cocaine NONE DETECTED NONE DETECTED   Benzodiazepines NONE DETECTED NONE DETECTED   Amphetamines  NONE DETECTED NONE DETECTED   Tetrahydrocannabinol NONE DETECTED NONE DETECTED   Barbiturates NONE DETECTED NONE DETECTED    Comment: (NOTE) DRUG SCREEN FOR MEDICAL PURPOSES ONLY.  IF CONFIRMATION IS NEEDED FOR ANY PURPOSE, NOTIFY LAB WITHIN 5 DAYS.  LOWEST DETECTABLE LIMITS FOR URINE DRUG SCREEN Drug Class  Cutoff (ng/mL) Amphetamine and metabolites    1000 Barbiturate and metabolites    200 Benzodiazepine                 200 Opiates and metabolites        300 Cocaine and metabolites        300 THC                            50 Performed at [facility], [address], [place], [place] [address]    EEG adult Result Date: 12/03/2023 Shelton Arlin KIDD, MD     12/03/2023 10:32 AM Patient Name: Starnisha Batrez Murph MRN: [id number] Epilepsy Attending: Arlin KIDD Shelton Referring Physician/Provider: Lonzell Emeline HERO, DO Date: 12/03/2023 Duration: 25.40 mins Patient history: 58yo F with ams getting eeg to evaluate for seizure Level of alertness: Awake,/lethargic AEDs during EEG study: None Technical aspects: This EEG study was done with scalp electrodes positioned according to the 10-20 International system of electrode placement. Electrical activity was reviewed with band pass filter of 1-70Hz , sensitivity of 7 uV/mm, display speed of 68mm/sec with a 60Hz  notched filter applied as appropriate. EEG data were recorded continuously and digitally stored.  Video monitoring was available and reviewed as appropriate. Description: No clear posterior dominant rhythm was seen. EEG showed continuous generalized polymorphic sharply contoured 3 to 6 Hz theta-delta slowing, at times with triphasic morphology.  Hyperventilation and photic stimulation were not performed.   ABNORMALITY - Continuous slow, generalized IMPRESSION: This study is suggestive of moderate diffuse encephalopathy likely related to toxic-metabolic causes. No seizures or definite epileptiform discharges were seen throughout  the recording. Arlin KIDD Shelton   MR BRAIN WO CONTRAST Result Date: 12/02/2023 CLINICAL DATA:  Neuro deficit, acute, stroke suspected. EXAM: MRI HEAD WITHOUT CONTRAST TECHNIQUE: Multiplanar, multiecho pulse sequences of the brain and surrounding structures were obtained without intravenous contrast. COMPARISON:  Head CT 12/02/2023 and MRI 10/13/2019 FINDINGS: The study is intermittently moderately motion degraded. Brain: There is no evidence of an acute infarct, intracranial hemorrhage, mass, midline shift, or extra-axial fluid collection. There is generalized cerebral atrophy which is mildly to moderately advanced for age. No significant white matter disease is evident. Vascular: Major intracranial vascular flow voids are preserved. Skull and upper cervical spine: Unremarkable bone marrow signal. Sinuses/Orbits: Extensive mucosal thickening in the bilateral ethmoid and left sphenoid and left maxillary sinuses with milder mucosal thickening in the other sinuses. Clear mastoid air cells. Unremarkable orbits. Other: None. IMPRESSION: 1. Motion degraded examination. No evidence of acute intracranial abnormality. 2. Mildly to moderately age advanced cerebral atrophy. 3. Paranasal sinus disease. Electronically Signed   By: Dasie Hamburg M.D.   On: 12/02/2023 18:35   DG Chest Portable 1 View Result Date: 12/02/2023 CLINICAL DATA:  Altered mental status. EXAM: PORTABLE CHEST 1 VIEW COMPARISON:  Chest radiograph dated 03/22/2021. FINDINGS: The heart size and mediastinal contours are within normal limits. Mild left basilar atelectasis/airspace disease. The right lung is clear. No pleural effusion or pneumothorax. The visualized skeletal structures are unremarkable. IMPRESSION: Mild left basilar atelectasis/airspace disease. Electronically Signed   By: Norman Hopper M.D.   On: 12/02/2023 15:40   CT Head Wo Contrast Result Date: 12/02/2023 CLINICAL DATA:  Mental status change, unknown cause EXAM: CT HEAD WITHOUT CONTRAST  TECHNIQUE: Contiguous axial images were obtained from the base of the skull through the vertex without intravenous contrast. RADIATION DOSE REDUCTION: This exam was performed according to  the departmental dose-optimization program which includes automated exposure control, adjustment of the mA and/or kV according to patient size and/or use of iterative reconstruction technique. COMPARISON:  Head CT 03/18/2021 FINDINGS: Brain: No hemorrhage. No hydrocephalus. No extra-axial fluid collection. No mass effect. No mass lesion. No CT evidence of an acute cortical infarct. Vascular: No hyperdense vessel or unexpected calcification. Skull: Normal. Negative for fracture or focal lesion. Sinuses/Orbits: No middle ear or mastoid effusion. Pansinus mucosal thickening. Orbits are unremarkable. Other: None. IMPRESSION: No CT etiology for altered mental status identified Electronically Signed   By: Lyndall Gore M.D.   On: 12/02/2023 15:06    Pending Labs Unresulted Labs (From admission, onward)     Start     Ordered   12/03/23 1257  Culture, blood (Routine X 2) w Reflex to ID Panel  BLOOD CULTURE X 2,   R (with TIMED occurrences)      12/03/23 1256   12/03/23 1256  Lactic acid, plasma  (Lactic Acid)  STAT Now then every 3 hours,   R (with STAT occurrences)      12/03/23 1255   12/02/23 1409  CBC with Differential  (ED ALOC)  Once,   STAT        12/02/23 1409            Vitals/Pain Today's Vitals   12/03/23 1030 12/03/23 1100 12/03/23 1230 12/03/23 1235  BP: (!) 80/52 (!) 107/58 (!) 134/118   Pulse: 62 62 78   Resp: 10 11 18    Temp:    (!) 93.4 F (34.1 C)  TempSrc:      SpO2: 97% 98% 100%   Weight:      Height:      PainSc:        Isolation Precautions Airborne precautions  Medications Medications  0.9 %  sodium chloride  infusion ( Intravenous New Bag/Given 12/03/23 0454)  ondansetron  (ZOFRAN ) tablet 4 mg (has no administration in time range)    Or  ondansetron  (ZOFRAN ) injection 4 mg (has  no administration in time range)  acetaminophen  (TYLENOL ) tablet 650 mg (has no administration in time range)    Or  acetaminophen  (TYLENOL ) suppository 650 mg (has no administration in time range)  metoprolol  tartrate (LOPRESSOR ) injection 5 mg (has no administration in time range)  cefTRIAXone  (ROCEPHIN ) 2 g in sodium chloride  0.9 % 100 mL IVPB (has no administration in time range)  enoxaparin  (LOVENOX ) injection 30 mg (has no administration in time range)  sodium chloride  0.9 % bolus 1,000 mL (0 mLs Intravenous Stopped 12/02/23 1711)  LORazepam  (ATIVAN ) injection 0.5 mg (0.5 mg Intravenous Given 12/02/23 1722)  sodium chloride  0.9 % bolus 1,000 mL (0 mLs Intravenous Stopped 12/02/23 2049)    Mobility non-ambulatory     Focused Assessments Neuro Assessment Handoff:  Cardiac Rhythm: Sinus tachycardia, Normal sinus rhythm NIH Stroke Scale  Level of Consciousness (1a.)   : Alert, keenly responsive LOC Questions (1b. )   : Answers neither question correctly LOC Commands (1c. )   : Performs neither task correctly Best Gaze (2. )  : Partial gaze palsy Visual (3. )  : No visual loss Facial Palsy (4. )    : Normal symmetrical movements Motor Arm, Left (5a. )   : No effort against gravity Motor Arm, Right (5b. ) : No effort against gravity Motor Leg, Left (6a. )  : No effort against gravity Motor Leg, Right (6b. ) : No effort against gravity Limb Ataxia (7. ): Absent Sensory (  8. )  : Severe to total sensory loss, patient is not aware of being touched in the face, arm, and leg Best Language (9. )  : Mute, global aphasia Dysarthria (10. ): Severe dysarthria, patient's speech is so slurred as to be unintelligible in the absence of or out of proportion to any dysphasia, or is mute/anarthric Extinction/Inattention (11.)   : Visual/tactile/auditory/spatial/personal inattention Complete NIHSS TOTAL: 25 Last date known well: 11/30/23 Last time known well: 2000 Neuro Assessment:   Neuro Checks:      If patient is a Neuro Trauma and patient is going to OR before floor call report to 4N Charge nurse: [phone] or [phone]   R Recommendations: See Admitting Provider Note  Report given to: [phone]

## 2023-12-03 NOTE — ED Notes (Signed)
 Pt being transported to get her LP.

## 2023-12-04 ENCOUNTER — Inpatient Hospital Stay (HOSPITAL_COMMUNITY): Payer: Medicare HMO

## 2023-12-04 DIAGNOSIS — I4891 Unspecified atrial fibrillation: Secondary | ICD-10-CM

## 2023-12-04 DIAGNOSIS — N179 Acute kidney failure, unspecified: Secondary | ICD-10-CM | POA: Diagnosis not present

## 2023-12-04 DIAGNOSIS — U071 COVID-19: Secondary | ICD-10-CM | POA: Diagnosis not present

## 2023-12-04 DIAGNOSIS — G9341 Metabolic encephalopathy: Secondary | ICD-10-CM | POA: Diagnosis not present

## 2023-12-04 LAB — CBC WITH DIFFERENTIAL/PLATELET
Abs Immature Granulocytes: 0.04 10*3/uL (ref 0.00–0.07)
Basophils Absolute: 0 10*3/uL (ref 0.0–0.1)
Basophils Relative: 0 %
Eosinophils Absolute: 0 10*3/uL (ref 0.0–0.5)
Eosinophils Relative: 1 %
HCT: 33.2 % — ABNORMAL LOW (ref 36.0–46.0)
Hemoglobin: 10.7 g/dL — ABNORMAL LOW (ref 12.0–15.0)
Immature Granulocytes: 1 %
Lymphocytes Relative: 29 %
Lymphs Abs: 1.9 10*3/uL (ref 0.7–4.0)
MCH: 30.8 pg (ref 26.0–34.0)
MCHC: 32.2 g/dL (ref 30.0–36.0)
MCV: 95.7 fL (ref 80.0–100.0)
Monocytes Absolute: 0.8 10*3/uL (ref 0.1–1.0)
Monocytes Relative: 12 %
Neutro Abs: 3.9 10*3/uL (ref 1.7–7.7)
Neutrophils Relative %: 57 %
Platelets: 129 10*3/uL — ABNORMAL LOW (ref 150–400)
RBC: 3.47 MIL/uL — ABNORMAL LOW (ref 3.87–5.11)
RDW: 15.9 % — ABNORMAL HIGH (ref 11.5–15.5)
WBC: 6.7 10*3/uL (ref 4.0–10.5)
nRBC: 0 % (ref 0.0–0.2)

## 2023-12-04 LAB — VDRL, CSF: VDRL Quant, CSF: NONREACTIVE

## 2023-12-04 LAB — ECHOCARDIOGRAM COMPLETE
AR max vel: 2.2 cm2
AV Area VTI: 2.06 cm2
AV Area mean vel: 2.15 cm2
AV Mean grad: 3 mm[Hg]
AV Peak grad: 5.9 mm[Hg]
Ao pk vel: 1.21 m/s
Area-P 1/2: 4.31 cm2
Height: 61 in
S' Lateral: 2.9 cm
Weight: 2105.83 [oz_av]

## 2023-12-04 LAB — BASIC METABOLIC PANEL
Anion gap: 11 (ref 5–15)
BUN: 15 mg/dL (ref 6–20)
CO2: 22 mmol/L (ref 22–32)
Calcium: 10.1 mg/dL (ref 8.9–10.3)
Chloride: 117 mmol/L — ABNORMAL HIGH (ref 98–111)
Creatinine, Ser: 1.43 mg/dL — ABNORMAL HIGH (ref 0.44–1.00)
GFR, Estimated: 42 mL/min — ABNORMAL LOW (ref >60)
Glucose, Bld: 78 mg/dL (ref 70–99)
Potassium: 4.3 mmol/L (ref 3.5–5.1)
Sodium: 150 mmol/L — ABNORMAL HIGH (ref 135–145)

## 2023-12-04 LAB — C-REACTIVE PROTEIN: CRP: 3.3 mg/dL — ABNORMAL HIGH (ref <1.0)

## 2023-12-04 LAB — SEDIMENTATION RATE: Sed Rate: 7 mm/h (ref 0–22)

## 2023-12-04 LAB — AMMONIA: Ammonia: 25 umol/L (ref 9–35)

## 2023-12-04 MED ORDER — SODIUM CHLORIDE 0.45 % IV SOLN: INTRAVENOUS | Status: AC

## 2023-12-04 MED ORDER — SODIUM CHLORIDE 0.9 % IV SOLN
2.0000 g | INTRAVENOUS | Status: DC
  Administered 2023-12-04 – 2023-12-06 (×3): 2 g via INTRAVENOUS
  Filled 2023-12-04 (×3): qty 20

## 2023-12-04 MED ORDER — VANCOMYCIN HCL 1000 MG IV SOLR
750.0000 mg | INTRAVENOUS | Status: DC
  Filled 2023-12-04: qty 15

## 2023-12-04 NOTE — Plan of Care (Signed)
  Problem: Respiratory: Goal: Will maintain a patent airway Outcome: Progressing   Problem: Clinical Measurements: Goal: Ability to maintain clinical measurements within normal limits will improve Outcome: Progressing   Problem: Safety: Goal: Ability to remain free from injury will improve Outcome: Progressing   Problem: Skin Integrity: Goal: Risk for impaired skin integrity will decrease Outcome: Progressing

## 2023-12-04 NOTE — Progress Notes (Signed)
 PROGRESS NOTE        PATIENT DETAILS Name: Paula Dickson Age: 58 y.o. Sex: female Date of Birth: 05-20-1966 Admit Date: 12/02/2023 Admitting Physician Emeline CHRISTELLA Idol, DO ERE:[email or web address]  Brief Summary: Patient is a 58 y.o.  female with history of bipolar disorder, hyperthyroidism-please continue with altered mental status-found to have COVID-19 infection.  Significant events: 1/5>> admit to [facility]  Significant studies: 1/5>> CXR: Left basilar airspace disease. 1/5>> MRI brain: No acute intracranial abnormality 1/5>> NH4:<10 1/5>> TSH: Within normal limit 1/6>> EEG: No seizures 1/6>> UDS: Negative 1/6>> CSF: WBC 1, protein 48  Significant microbiology data: 1/5>> COVID PCR: Positive 1/5>> influenza/RSV PCR: Negative 1/6>> blood culture: Pending 1/6>> CSF culture: Pending 1/6>> CSF biofire panel: Negative  Procedures: 1/6>> fluoroscopy-guided LP  Consults: Neurology  Subjective: Lying comfortably in bed-awake but grunts/mumbles-does not talk.  Tracks my movement.  Objective: Vitals: Blood pressure (!) 150/87, pulse 96, temperature 98.3 F (36.8 C), temperature source Oral, resp. rate 14, height 5' 1 (1.549 m), weight 59.7 kg, SpO2 97%.   Exam: Gen Exam:Awake-not in any distress HEENT:atraumatic, normocephalic Chest: B/L clear to auscultation anteriorly CVS:S1S2 regular Abdomen:soft non tender, non distended Extremities:no edema Neurology: Difficulty but moves all 4 extremities in response to pain. Skin: no rash  Pertinent Labs/Radiology:    Latest Ref Rng & Units 12/04/2023    5:10 AM 12/03/2023    4:50 AM 12/03/2023    2:51 AM  CBC  WBC 4.0 - 10.5 K/uL 6.7  7.9    Hemoglobin 12.0 - 15.0 g/dL 89.2  87.5  9.9   Hematocrit 36.0 - 46.0 % 33.2  38.8  29.0   Platelets 150 - 400 K/uL 129  112      Lab Results  Component Value Date   NA 150 (H) 12/04/2023   K 4.3 12/04/2023   CL 117 (H) 12/04/2023   CO2 22 12/04/2023      Assessment/Plan: Acute metabolic encephalopathy Etiology unclear but suspect multifactorial-from COVID-19 infection-mild elevation in lithium  levels-mild hypernatremia-AKI all playing a role. Neuro imaging/CSF analysis/Spot EEG all negative Since meningitis ruled out-stop vancomycin /acyclovir  No family at bedside-unclear what her baseline is-continue to treat underlying etiologies Neurology following  Tremors Observed at rest but clearly worsens with movement-not sure if this is at baseline Unclear etiology-but could be related to encephalopathy/debility/lithium  Neurology following  COVID-19 infection On room air-minimal infiltrate seen on x-ray on left base-possible aspiration rather than from COVID. Supportive care at this point  ?  Aspiration pneumonia Small infiltrate on left lung base-could have aspirated due to encephalopathy Continue Rocephin  Await blood cultures.  Hypernatremia Worsening Change IVF to half NS Recheck electrolytes tomorrow  Hypercalcemia Mild Probably was from dehydration (likely)-possibly from mildly elevated lithium  levels as well Levels have now normalized Follow electrolytes  AKI on CKD 2 AKI likely hemodynamically mediated Continue IVF Recheck electrolytes  PAF EKG on admit consistent with A-fib Has been sinus rhythm on telemetry TSH stable-check echo Possible that A-fib triggered by COVID-19 infection-appears brief-not sure if she is a good long-term candidate for anticoagulation- given psych issues-?  Compliance For now-monitor off anticoagulation-once family arrives-we can have a more elaborate discussion.  History of hyperthyroidism Does not appear to be on Tapazole  any longer Not sure if she is on propranolol -on home med list TSH within normal limits Stable for outpatient monitoring  Bipolar disorder Lithium   levels mildly elevated-Depakote  level stable She appears to be more awake compared to yesterday from prior  documentation All psych meds currently on hold Repeat lithium  levels tomorrow  Debility/deconditioning Spoke with Sister-[name]-1/7-mostly bedbound-able to take a few steps to go to the bathroom/kitchen-but basically stopped walking long distance last week of November.  Apparently she is afraid of falling.  For the past several days-mostly bedbound-with poor oral intake.  No sick contacts. Getting PT/OT/SLP eval.  BMI: Estimated body mass index is 24.87 kg/m as calculated from the following:   Height as of this encounter: 5' 1 (1.549 m).   Weight as of this encounter: 59.7 kg.   Code status:   Code Status: Full Code   DVT Prophylaxis: heparin  injection 5,000 Units Start: 12/04/23 0000   Family Communication: Sister-[name]-[phone] updated over the phone 1/7   Disposition Plan: Status is: Inpatient Remains inpatient appropriate because: Severity of illness   Planned Discharge Destination:Home health versus SNF   Diet: Diet Order     None         Antimicrobial agents: Anti-infectives (From admission, onward)    Start     Dose/Rate Route Frequency Ordered Stop   12/04/23 2200  cefTRIAXone  (ROCEPHIN ) 2 g in sodium chloride  0.9 % 100 mL IVPB        2 g 200 mL/hr over 30 Minutes Intravenous Every 24 hours 12/04/23 0831     12/04/23 1400  vancomycin  (VANCOREADY) IVPB 750 mg/150 mL  Status:  Discontinued        750 mg 150 mL/hr over 60 Minutes Intravenous Every 24 hours 12/03/23 1320 12/04/23 0453   12/04/23 1400  vancomycin  (VANCOCIN ) 750 mg in sodium chloride  0.9 % 250 mL IVPB  Status:  Discontinued        750 mg 265 mL/hr over 60 Minutes Intravenous Every 24 hours 12/04/23 0453 12/04/23 0831   12/03/23 1315  cefTRIAXone  (ROCEPHIN ) 2 g in sodium chloride  0.9 % 100 mL IVPB  Status:  Discontinued        2 g 200 mL/hr over 30 Minutes Intravenous Every 12 hours 12/03/23 1306 12/04/23 0831   12/03/23 1315  vancomycin  (VANCOREADY) IVPB 1250 mg/250 mL        1,250  mg 166.7 mL/hr over 90 Minutes Intravenous  Once 12/03/23 1307 12/03/23 1849   12/03/23 1315  acyclovir  (ZOVIRAX ) 595 mg in dextrose  5 % 100 mL IVPB  Status:  Discontinued        10 mg/kg  59.7 kg 111.9 mL/hr over 60 Minutes Intravenous Every 12 hours 12/03/23 1312 12/04/23 0831   12/03/23 1300  cefTRIAXone  (ROCEPHIN ) 2 g in sodium chloride  0.9 % 100 mL IVPB  Status:  Discontinued        2 g 200 mL/hr over 30 Minutes Intravenous Daily 12/03/23 1255 12/03/23 1306        MEDICATIONS: Scheduled Meds:  heparin  injection (subcutaneous)  5,000 Units Subcutaneous Q8H   Continuous Infusions:  sodium chloride  100 mL/hr at 12/04/23 0819   cefTRIAXone  (ROCEPHIN )  IV     PRN Meds:.acetaminophen  **OR** acetaminophen , metoprolol  tartrate, ondansetron  **OR** ondansetron  (ZOFRAN ) IV   I have personally reviewed following labs and imaging studies  LABORATORY DATA: CBC: Recent Labs  Lab 12/02/23 1555 12/03/23 0251 12/03/23 0450 12/04/23 0510  WBC 10.6*  --  7.9 6.7  NEUTROABS 9.0*  --   --  3.9  HGB 16.3* 9.9* 12.4 10.7*  HCT 49.4* 29.0* 38.8 33.2*  MCV 93.4  --  96.0 95.7  PLT 150  --  112* 129*    Basic Metabolic Panel: Recent Labs  Lab 12/02/23 1555 12/03/23 0251 12/03/23 0450 12/04/23 0510  NA 141 146* 146* 150*  K 4.1 4.1 4.5 4.3  CL 100  --  113* 117*  CO2 25  --  24 22  GLUCOSE 109*  --  71 78  BUN 25*  --  18 15  CREATININE 1.56*  --  1.30* 1.43*  CALCIUM 11.6*  --  10.0 10.1    GFR: Estimated Creatinine Clearance: 35.2 mL/min (A) (by C-G formula based on SCr of 1.43 mg/dL (H)).  Liver Function Tests: Recent Labs  Lab 12/02/23 1555 12/03/23 0450  AST 43* 30  ALT 41 29  ALKPHOS 204* 136*  BILITOT 0.7 0.7  PROT 8.5* 6.0*  ALBUMIN 4.2 3.0*   No results for input(s): LIPASE, AMYLASE in the last 168 hours. Recent Labs  Lab 12/02/23 2104 12/04/23 0510  AMMONIA <10 25    Coagulation Profile: No results for input(s): INR, PROTIME in the last  168 hours.  Cardiac Enzymes: Recent Labs  Lab 12/03/23 0450  CKTOTAL 463*    BNP (last 3 results) No results for input(s): PROBNP in the last 8760 hours.  Lipid Profile: No results for input(s): CHOL, HDL, LDLCALC, TRIG, CHOLHDL, LDLDIRECT in the last 72 hours.  Thyroid  Function Tests: Recent Labs    12/02/23 1511  TSH 3.325    Anemia Panel: No results for input(s): VITAMINB12, FOLATE, FERRITIN, TIBC, IRON, RETICCTPCT in the last 72 hours.  Urine analysis:    Component Value Date/Time   COLORURINE STRAW (A) 12/03/2023 0519   APPEARANCEUR CLEAR 12/03/2023 0519   LABSPEC 1.006 12/03/2023 0519   PHURINE 7.0 12/03/2023 0519   GLUCOSEU NEGATIVE 12/03/2023 0519   HGBUR NEGATIVE 12/03/2023 0519   BILIRUBINUR NEGATIVE 12/03/2023 0519   BILIRUBINUR neg 12/27/2015 1421   KETONESUR NEGATIVE 12/03/2023 0519   PROTEINUR NEGATIVE 12/03/2023 0519   UROBILINOGEN 0.2 12/27/2015 1421   UROBILINOGEN 0.2 06/14/2015 0040   NITRITE NEGATIVE 12/03/2023 0519   LEUKOCYTESUR NEGATIVE 12/03/2023 0519    Sepsis Labs: Lactic Acid, Venous    Component Value Date/Time   LATICACIDVEN 0.9 12/03/2023 1911    MICROBIOLOGY: Recent Results (from the past 240 hours)  Resp panel by RT-PCR (RSV, Flu A&B, Covid) Anterior Nasal Swab     Status: Abnormal   Collection Time: 12/02/23  7:50 PM   Specimen: Anterior Nasal Swab  Result Value Ref Range Status   SARS Coronavirus 2 by RT PCR POSITIVE (A) NEGATIVE Final   Influenza A by PCR NEGATIVE NEGATIVE Final   Influenza B by PCR NEGATIVE NEGATIVE Final    Comment: (NOTE) The Xpert Xpress SARS-CoV-2/FLU/RSV plus assay is intended as an aid in the diagnosis of influenza from Nasopharyngeal swab specimens and should not be used as a sole basis for treatment. Nasal washings and aspirates are unacceptable for Xpert Xpress SARS-CoV-2/FLU/RSV testing.  Fact Sheet for Patients: bloggercourse.com  Fact  Sheet for Healthcare Providers: seriousbroker.it  This test is not yet approved or cleared by the [place]  FDA and has been authorized for detection and/or diagnosis of SARS-CoV-2 by FDA under an Emergency Use Authorization (EUA). This EUA will remain in effect (meaning this test can be used) for the duration of the COVID-19 declaration under Section 564(b)(1) of the Act, 21 U.S.C. section 360bbb-3(b)(1), unless the authorization is terminated or revoked.     Resp Syncytial Virus by PCR NEGATIVE NEGATIVE Final  Comment: (NOTE) Fact Sheet for Patients: bloggercourse.com  Fact Sheet for Healthcare Providers: seriousbroker.it  This test is not yet approved or cleared by the [place]  FDA and has been authorized for detection and/or diagnosis of SARS-CoV-2 by FDA under an Emergency Use Authorization (EUA). This EUA will remain in effect (meaning this test can be used) for the duration of the COVID-19 declaration under Section 564(b)(1) of the Act, 21 U.S.C. section 360bbb-3(b)(1), unless the authorization is terminated or revoked.  Performed at [facility], [address], [place], [place] [address]   CSF culture w Gram Stain     Status: None (Preliminary result)   Collection Time: 12/03/23  1:46 PM   Specimen: PATH Cytology CSF; Cerebrospinal Fluid  Result Value Ref Range Status   Specimen Description CSF  Final   Special Requests NONE  Final   Gram Stain   Final    NO WBC SEEN NO ORGANISMS SEEN Performed at [facility], [address], [place], [place] [address]    Culture PENDING  Incomplete   Report Status PENDING  Incomplete  Anaerobic culture w Gram Stain     Status: None (Preliminary result)   Collection Time: 12/03/23  2:43 PM   Specimen: PATH Cytology CSF; Cerebrospinal Fluid  Result Value Ref Range Status   Specimen Description CSF  Final   Special Requests NONE   Final   Gram Stain   Final    NO WBC SEEN NO ORGANISMS SEEN Performed at [facility], [address], [place], [place] [address]    Culture PENDING  Incomplete   Report Status PENDING  Incomplete    RADIOLOGY STUDIES/RESULTS: DG FL GUIDED LUMBAR PUNCTURE Result Date: 12/03/2023 CLINICAL DATA:  58 year old female presents with altered mental status, hypothermia and concern for neuroleptic malignant syndrome. EXAM: DIAGNOSTIC LUMBAR PUNCTURE UNDER FLUOROSCOPIC GUIDANCE COMPARISON:  None Available. FLUOROSCOPY: Radiation Exposure Index (as provided by the fluoroscopic device): 3.2 mGy Kerma PROCEDURE: Informed consent was obtained from the patient's sister Miss Winton Mellor prior to the procedure, including potential risks and complications of headache, allergy, pain, bleeding, infection, damage to adjacent structures, spinal canal hematoma, CSF leak which may require additional procedure, and low yield. Time-out was performed to verify correct patient and correct procedure. Allergies were reviewed. With the patient prone, the lower back was prepped with Betadine. 1% Lidocaine  was used for local anesthesia. Lumbar puncture was performed at the L2-L3 level using a 3.5 inch, 20 gauge needle with return of clear, colorless CSF with an opening pressure of 14 cm water . 20 ml of CSF were obtained for laboratory studies. The spinal needle was removed, sterile bandage was placed. The patient tolerated the procedure well and there were no apparent complications. IMPRESSION: Technically successful fluoro guided lumbar puncture. This procedure was performed by Toya Cousin, PA-C under supervision of Juliene Balder, MD. Electronically Signed   By: Juliene Balder M.D.   On: 12/03/2023 15:36   EEG adult Result Date: 12/03/2023 Shelton Arlin KIDD, MD     12/03/2023 10:32 AM Patient Name: Caty Tessler Schriver MRN: [id number] Epilepsy Attending: Arlin KIDD Shelton Referring Physician/Provider: Lonzell Emeline HERO, DO Date: 12/03/2023  Duration: 25.40 mins Patient history: 58yo F with ams getting eeg to evaluate for seizure Level of alertness: Awake,/lethargic AEDs during EEG study: None Technical aspects: This EEG study was done with scalp electrodes positioned according to the 10-20 International system of electrode placement. Electrical activity was reviewed with band pass filter of 1-70Hz ,  sensitivity of 7 uV/mm, display speed of 56mm/sec with a 60Hz  notched filter applied as appropriate. EEG data were recorded continuously and digitally stored.  Video monitoring was available and reviewed as appropriate. Description: No clear posterior dominant rhythm was seen. EEG showed continuous generalized polymorphic sharply contoured 3 to 6 Hz theta-delta slowing, at times with triphasic morphology.  Hyperventilation and photic stimulation were not performed.   ABNORMALITY - Continuous slow, generalized IMPRESSION: This study is suggestive of moderate diffuse encephalopathy likely related to toxic-metabolic causes. No seizures or definite epileptiform discharges were seen throughout the recording. Arlin MALVA Krebs   MR BRAIN WO CONTRAST Result Date: 12/02/2023 CLINICAL DATA:  Neuro deficit, acute, stroke suspected. EXAM: MRI HEAD WITHOUT CONTRAST TECHNIQUE: Multiplanar, multiecho pulse sequences of the brain and surrounding structures were obtained without intravenous contrast. COMPARISON:  Head CT 12/02/2023 and MRI 10/13/2019 FINDINGS: The study is intermittently moderately motion degraded. Brain: There is no evidence of an acute infarct, intracranial hemorrhage, mass, midline shift, or extra-axial fluid collection. There is generalized cerebral atrophy which is mildly to moderately advanced for age. No significant white matter disease is evident. Vascular: Major intracranial vascular flow voids are preserved. Skull and upper cervical spine: Unremarkable bone marrow signal. Sinuses/Orbits: Extensive mucosal thickening in the bilateral ethmoid and  left sphenoid and left maxillary sinuses with milder mucosal thickening in the other sinuses. Clear mastoid air cells. Unremarkable orbits. Other: None. IMPRESSION: 1. Motion degraded examination. No evidence of acute intracranial abnormality. 2. Mildly to moderately age advanced cerebral atrophy. 3. Paranasal sinus disease. Electronically Signed   By: Dasie Hamburg M.D.   On: 12/02/2023 18:35   DG Chest Portable 1 View Result Date: 12/02/2023 CLINICAL DATA:  Altered mental status. EXAM: PORTABLE CHEST 1 VIEW COMPARISON:  Chest radiograph dated 03/22/2021. FINDINGS: The heart size and mediastinal contours are within normal limits. Mild left basilar atelectasis/airspace disease. The right lung is clear. No pleural effusion or pneumothorax. The visualized skeletal structures are unremarkable. IMPRESSION: Mild left basilar atelectasis/airspace disease. Electronically Signed   By: Norman Hopper M.D.   On: 12/02/2023 15:40   CT Head Wo Contrast Result Date: 12/02/2023 CLINICAL DATA:  Mental status change, unknown cause EXAM: CT HEAD WITHOUT CONTRAST TECHNIQUE: Contiguous axial images were obtained from the base of the skull through the vertex without intravenous contrast. RADIATION DOSE REDUCTION: This exam was performed according to the departmental dose-optimization program which includes automated exposure control, adjustment of the mA and/or kV according to patient size and/or use of iterative reconstruction technique. COMPARISON:  Head CT 03/18/2021 FINDINGS: Brain: No hemorrhage. No hydrocephalus. No extra-axial fluid collection. No mass effect. No mass lesion. No CT evidence of an acute cortical infarct. Vascular: No hyperdense vessel or unexpected calcification. Skull: Normal. Negative for fracture or focal lesion. Sinuses/Orbits: No middle ear or mastoid effusion. Pansinus mucosal thickening. Orbits are unremarkable. Other: None. IMPRESSION: No CT etiology for altered mental status identified Electronically  Signed   By: Lyndall Gore M.D.   On: 12/02/2023 15:06     LOS: 1 day   Donalda Applebaum, MD  [facility]    To contact the attending provider between 7A-7P or the covering provider during after hours 7P-7A, please log into the web site [email or web address] and access using universal [place] password for that web site. If you do not have the password, please call the hospital operator.  12/04/2023, 9:04 AM

## 2023-12-04 NOTE — Progress Notes (Signed)
 NEUROLOGY CONSULT FOLLOW UP NOTE   Date of service: December 04, 2023 Patient Name: Paula Dickson MRN:  [id number] DOB:  [birth year]   Interval Hx/subjective  Seen and examined.  RN reports that whenever he has moved her today or turning her, she has these episodes of laughing. She continues to have tremors that are very stimulus induced or brought about by stimulation.  Vitals   Vitals:   12/03/23 1700 12/03/23 2000 12/04/23 0000 12/04/23 0800  BP:  130/79  (!) 150/87  Pulse:  93  96  Resp:  (!) 8  14  Temp: 98.5 F (36.9 C) 99.6 F (37.6 C) 97.9 F (36.6 C) 98.3 F (36.8 C)  TempSrc: Oral Axillary Axillary Oral  SpO2:  100%  97%  Weight:      Height:         Body mass index is 24.87 kg/m.  Physical Exam  GEN: Well-developed well-nourished woman comfortably laying in bed in no acute distress. HEENT: Normocephalic atraumatic CVS: Regular rate rhythm Respiratory: Clear chest Neurological exam Comfortably sleeping in bed Opens eyes to voice Mumbles incomprehensible words Attempts to follow commands but not consistent Has a distractible and stimulus induced tremor in both upper extremities.  Legs were without tremor until the point that I started to passively raise them. Cranial nerves: Pupils equal round reactive to light, extraocular movements appear unhindered, face appears symmetric. Motor examination with a distractible tremor as above.  Is able to move both extremities away in response to noxious stimulation. Sensory: As above Cerebellar difficult to assess given that she does not necessarily follow commands consistently  Medications  Current Facility-Administered Medications:    0.45 % sodium chloride  infusion, , Intravenous, Continuous, Ghimire, Donalda HERO, MD, Last Rate: 100 mL/hr at 12/04/23 1310, Restarted at 12/04/23 1310   acetaminophen  (TYLENOL ) tablet 650 mg, 650 mg, Oral, Q6H PRN **OR** acetaminophen  (TYLENOL ) suppository 650 mg, 650 mg, Rectal, Q6H  PRN, Lonzell, Jared M, DO   cefTRIAXone  (ROCEPHIN ) 2 g in sodium chloride  0.9 % 100 mL IVPB, 2 g, Intravenous, Q24H, Ghimire, Donalda HERO, MD   heparin  injection 5,000 Units, 5,000 Units, Subcutaneous, Q8H, Samtani, Jai-Gurmukh, MD, 5,000 Units at 12/04/23 1310   metoprolol  tartrate (LOPRESSOR ) injection 5 mg, 5 mg, Intravenous, Q6H PRN, Lonzell, Jared M, DO   ondansetron  (ZOFRAN ) tablet 4 mg, 4 mg, Oral, Q6H PRN **OR** ondansetron  (ZOFRAN ) injection 4 mg, 4 mg, Intravenous, Q6H PRN, Lonzell Emeline HERO, DO [facility]   CBC:  Recent Labs  Lab 12/02/23 1555 12/03/23 0251 12/03/23 0450 12/04/23 0510  WBC 10.6*  --  7.9 6.7  NEUTROABS 9.0*  --   --  3.9  HGB 16.3*   < > 12.4 10.7*  HCT 49.4*   < > 38.8 33.2*  MCV 93.4  --  96.0 95.7  PLT 150  --  112* 129*   < > = values in this interval not displayed.    Basic Metabolic Panel:  Lab Results  Component Value Date   NA 150 (H) 12/04/2023   K 4.3 12/04/2023   CO2 22 12/04/2023   GLUCOSE 78 12/04/2023   BUN 15 12/04/2023   CREATININE 1.43 (H) 12/04/2023   CALCIUM 10.1 12/04/2023   GFRNONAA 42 (L) 12/04/2023   GFRAA >60 02/07/2020   Lipid Panel:  Lab Results  Component Value Date   LDLCALC 56 11/24/2021   HgbA1c:  Lab Results  Component Value Date   HGBA1C 6.1 (H) 11/24/2021   Urine  Drug Screen:     Component Value Date/Time   LABOPIA NONE DETECTED 12/03/2023 0519   COCAINSCRNUR NONE DETECTED 12/03/2023 0519   LABBENZ NONE DETECTED 12/03/2023 0519   AMPHETMU NONE DETECTED 12/03/2023 0519   THCU NONE DETECTED 12/03/2023 0519   LABBARB NONE DETECTED 12/03/2023 0519    Alcohol  Level     Component Value Date/Time   ETH <10 09/21/2022 1315   INR  Lab Results  Component Value Date   INR 1.0 03/18/2021   APTT  Lab Results  Component Value Date   APTT 28 02/03/2021   AED levels:  Lab Results  Component Value Date   LAMOTRIGINE  2.6 12/23/2019   CT head, MRI head unremarkable for acute  process.  Fluoroscopy guided LP performed CSF: RBC 1 and 8, WBC 1 and 1, cells too few to count, total protein 48, glucose 47.  Meningitis/encephalitis panel negative   ID: SARS-CoV-2 positive  Routine EEG-generalized slowing.  Moderate diffuse encephalopathy  Assessment   Paula Dickson is a 58 y.o. 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 Noted to be positive for COVID-19 with mildly elevated lithium  level at 1.28.  Depakote  undetectable on arrival.  Although concern for CNS infection was low, in discussion with the hospitalist yesterday, spinal tap was performed which had unremarkable results and negative meningitis/encephalitis panel.  Her tremors seem to be stimulus induced and distractible but could be from some amount of lithium  toxicity in the setting of acute toxic metabolic derangements.  Also had hypercalcemia. Multifactorial toxic metabolic encephalopathy versus encephalopathy related to COVID-19 infection versus encephalopathy from her psychiatric illness or under the differentials.    Recommendations  As of right now, I would recommend continuing supportive care. Monitor CK levels and lithium  levels.  I would recommend checking lithium  level tomorrow.  Also repeat CK tomorrow. Supportive care per primary team as you are Agree with the cessation of meningitic/encephalitic coverage. Will follow ______________________________________________________________________   Bonney Eligio Lav, MD [facility]

## 2023-12-04 NOTE — Progress Notes (Signed)
 SLP Cancellation Note  Patient Details Name: Shahida Schnackenberg Warda MRN: [id number] DOB: June 09, 1966   Cancelled treatment:       Reason Eval/Treat Not Completed: Patient's level of consciousness Per RN, patient lethargic, opens eyes but not following commands. SLP will f/u next date.  Norleen IVAR Blase, MA, CCC-SLP Speech Therapy

## 2023-12-05 DIAGNOSIS — G9341 Metabolic encephalopathy: Secondary | ICD-10-CM | POA: Diagnosis not present

## 2023-12-05 DIAGNOSIS — U071 COVID-19: Secondary | ICD-10-CM | POA: Diagnosis not present

## 2023-12-05 DIAGNOSIS — N179 Acute kidney failure, unspecified: Secondary | ICD-10-CM | POA: Diagnosis not present

## 2023-12-05 LAB — LITHIUM LEVEL: Lithium Lvl: 0.58 mmol/L — ABNORMAL LOW (ref 0.60–1.20)

## 2023-12-05 LAB — BASIC METABOLIC PANEL
Anion gap: 7 (ref 5–15)
BUN: 13 mg/dL (ref 6–20)
CO2: 26 mmol/L (ref 22–32)
Calcium: 10.6 mg/dL — ABNORMAL HIGH (ref 8.9–10.3)
Chloride: 116 mmol/L — ABNORMAL HIGH (ref 98–111)
Creatinine, Ser: 1.3 mg/dL — ABNORMAL HIGH (ref 0.44–1.00)
GFR, Estimated: 47 mL/min — ABNORMAL LOW (ref >60)
Glucose, Bld: 82 mg/dL (ref 70–99)
Potassium: 4.2 mmol/L (ref 3.5–5.1)
Sodium: 149 mmol/L — ABNORMAL HIGH (ref 135–145)

## 2023-12-05 LAB — CBC WITH DIFFERENTIAL/PLATELET
Abs Immature Granulocytes: 0.05 10*3/uL (ref 0.00–0.07)
Basophils Absolute: 0 10*3/uL (ref 0.0–0.1)
Basophils Relative: 1 %
Eosinophils Absolute: 0.1 10*3/uL (ref 0.0–0.5)
Eosinophils Relative: 1 %
HCT: 36.4 % (ref 36.0–46.0)
Hemoglobin: 11.8 g/dL — ABNORMAL LOW (ref 12.0–15.0)
Immature Granulocytes: 1 %
Lymphocytes Relative: 32 %
Lymphs Abs: 2.1 10*3/uL (ref 0.7–4.0)
MCH: 31.5 pg (ref 26.0–34.0)
MCHC: 32.4 g/dL (ref 30.0–36.0)
MCV: 97.1 fL (ref 80.0–100.0)
Monocytes Absolute: 0.9 10*3/uL (ref 0.1–1.0)
Monocytes Relative: 14 %
Neutro Abs: 3.3 10*3/uL (ref 1.7–7.7)
Neutrophils Relative %: 51 %
Platelets: 143 10*3/uL — ABNORMAL LOW (ref 150–400)
RBC: 3.75 MIL/uL — ABNORMAL LOW (ref 3.87–5.11)
RDW: 16 % — ABNORMAL HIGH (ref 11.5–15.5)
WBC: 6.5 10*3/uL (ref 4.0–10.5)
nRBC: 0 % (ref 0.0–0.2)

## 2023-12-05 LAB — CYTOLOGY - NON PAP

## 2023-12-05 LAB — CK: Total CK: 45 U/L (ref 38–234)

## 2023-12-05 MED ORDER — DEXTROSE 5 % IV SOLN: INTRAVENOUS | Status: AC

## 2023-12-05 MED ORDER — HYDRALAZINE HCL 20 MG/ML IJ SOLN
10.0000 mg | Freq: Four times a day (QID) | INTRAMUSCULAR | Status: DC | PRN
  Administered 2023-12-05 – 2023-12-06 (×2): 10 mg via INTRAVENOUS
  Filled 2023-12-05 (×2): qty 1

## 2023-12-05 NOTE — Evaluation (Signed)
 Occupational Therapy Evaluation Patient Details Name: Paula Dickson MRN: [id number] DOB: [birth year] Today's Date: 12/05/2023   History of Present Illness 58 y.o.  female with history of bipolar disorder, hyperthyroidism-please continue with altered mental status-found to have COVID-19 infection. PMH significant for tremors, acute metabolic encephalopathy, asipiration PNA, hypernatremia, hypercalcemia, AKI on CKD 2, PAF, hyperthyroidism, BPD, debility   Clinical Impression   Pt presents with significant generalized weakness, unable to respond consistent to commands or questions, co-treat with PT. Per chart, Pt lives alone, last seen well 11/30/23, family found 12/02/23 unresponsive. Pt currently has little to no purposeful AROM to BUE/BLEs, swelling present to B hands/wrists. Pt assisted to EOB total A x2, able to maintain balance in sitting position. Pt unable to follow commands consistently, <50%. Pt able to stand and take lateral steps with mod A x2 HHA, able to stand up to 30-40 seconds at a time. Pt would benefit greatly from postacute rehab <3hrs/day to maximize participation and functional independence, will continue to see acutely to progress as able.       If plan is discharge home, recommend the following: Two people to help with walking and/or transfers;Two people to help with bathing/dressing/bathroom;Assistance with cooking/housework;Direct supervision/assist for medications management;Help with stairs or ramp for entrance;Assist for transportation    Functional Status Assessment  Patient has had a recent decline in their functional status and demonstrates the ability to make significant improvements in function in a reasonable and predictable amount of time.  Equipment Recommendations  Other (comment) (defer)    Recommendations for Other Services       Precautions / Restrictions Precautions Precautions: Fall Restrictions Weight Bearing Restrictions Per Provider Order: No       Mobility Bed Mobility Overal bed mobility: Needs Assistance Bed Mobility: Supine to Sit, Sit to Supine     Supine to sit: Total assist, +2 for physical assistance, HOB elevated Sit to supine: Total assist, +2 for physical assistance   General bed mobility comments: total for in/out of bed, not able to move BUE/BLEs to assist in meaningful way. Pt has enough core strength to maintain positioning when sitting up    Transfers Overall transfer level: Needs assistance Equipment used: 2 person hand held assist Transfers: Sit to/from Stand Sit to Stand: Mod assist, +2 physical assistance           General transfer comment: mod A x2 HHA for STS, able to take side steps with max effort and cueing.      Balance Overall balance assessment: Needs assistance Sitting-balance support: No upper extremity supported, Feet supported Sitting balance-Leahy Scale: Fair Sitting balance - Comments: able to maintain balance sitting EOB   Standing balance support: Bilateral upper extremity supported, During functional activity, Reliant on assistive device for balance Standing balance-Leahy Scale: Poor Standing balance comment: reliant on x2 HHA for standing, mod A x2                           ADL either performed or assessed with clinical judgement   ADL Overall ADL's : Needs assistance/impaired Eating/Feeding: Total assistance   Grooming: Total assistance   Upper Body Bathing: Total assistance   Lower Body Bathing: Total assistance   Upper Body Dressing : Total assistance   Lower Body Dressing: Total assistance   Toilet Transfer: Moderate assistance;+2 for safety/equipment;BSC/3in1   Toileting- Clothing Manipulation and Hygiene: Total assistance         General ADL Comments: Pt total for  all ADLs, is able to perform STS with mod A x2 and take side steps with max cueing/effort.     Vision         Perception         Praxis         Pertinent Vitals/Pain  Pain Assessment Pain Assessment: Faces Faces Pain Scale: No hurt     Extremity/Trunk Assessment Upper Extremity Assessment Upper Extremity Assessment: RUE deficits/detail;LUE deficits/detail RUE Deficits / Details: only able to perform slight gross grip. little to no other AROM. Swelling present to B hands RUE Coordination: decreased fine motor;decreased gross motor LUE Deficits / Details: only able to perform slight gross grip. little to no other AROM. Swelling present to B hands LUE Coordination: decreased gross motor;decreased fine motor   Lower Extremity Assessment Lower Extremity Assessment: Defer to PT evaluation       Communication Communication Communication: Difficulty following commands/understanding;Difficulty communicating thoughts/reduced clarity of speech Following commands: Follows one step commands inconsistently Cueing Techniques: Verbal cues;Gestural cues;Visual cues   Cognition Arousal: Alert Behavior During Therapy: Flat affect Overall Cognitive Status: Impaired/Different from baseline Area of Impairment: Following commands                       Following Commands: Follows one step commands inconsistently       General Comments: Pt difficult to assess, able to follow simple commands <50% of time, limites due to generalized weakness/stiffness. Able to nod yes/no, but inconsistent with answers     General Comments       Exercises     Shoulder Instructions      Home Living Family/patient expects to be discharged to:: Private residence Living Arrangements: Alone                               Additional Comments: Per notes Pt lives alone and family checks on her, last seen normal on 1/3, checked on 1/5 and was unresponsive.      Prior Functioning/Environment Prior Level of Function : Independent/Modified Independent                        OT Problem List: Decreased strength;Decreased range of motion;Decreased  activity tolerance;Impaired balance (sitting and/or standing);Decreased coordination;Decreased cognition;Decreased safety awareness;Decreased knowledge of use of DME or AE;Impaired UE functional use;Increased edema      OT Treatment/Interventions: Self-care/ADL training;Therapeutic exercise;Neuromuscular education;Energy conservation;DME and/or AE instruction;Manual therapy;Therapeutic activities;Balance training;Patient/family education    OT Goals(Current goals can be found in the care plan section) Acute Rehab OT Goals Patient Stated Goal: not able to participate in goal setting OT Goal Formulation: Patient unable to participate in goal setting Time For Goal Achievement: 12/19/23 Potential to Achieve Goals: Fair  OT Frequency: Min 1X/week    Co-evaluation PT/OT/SLP Co-Evaluation/Treatment: Yes Reason for Co-Treatment: Complexity of the patient's impairments (multi-system involvement);Necessary to address cognition/behavior during functional activity;For patient/therapist safety;To address functional/ADL transfers   OT goals addressed during session: ADL's and self-care      AM-PAC OT 6 Clicks Daily Activity     Outcome Measure Help from another person eating meals?: Total Help from another person taking care of personal grooming?: Total Help from another person toileting, which includes using toliet, bedpan, or urinal?: Total Help from another person bathing (including washing, rinsing, drying)?: Total Help from another person to put on and taking off regular upper body clothing?: Total Help from another  person to put on and taking off regular lower body clothing?: Total 6 Click Score: 6   End of Session Nurse Communication: Mobility status  Activity Tolerance: Patient tolerated treatment well Patient left: in bed;with call bell/phone within reach;with bed alarm set  OT Visit Diagnosis: Unsteadiness on feet (R26.81);Other abnormalities of gait and mobility (R26.89);Muscle  weakness (generalized) (M62.81);Other symptoms and signs involving cognitive function                Time: 8954-8884 OT Time Calculation (min): 30 min Charges:  OT General Charges $OT Visit: 1 Visit OT Evaluation $OT Eval High Complexity: 1 High OT Treatments $Self Care/Home Management : 8-22 mins  [place], OTR/L   Paula Dickson 12/05/2023, 12:36 PM

## 2023-12-05 NOTE — Evaluation (Signed)
 Clinical/Bedside Swallow Evaluation Patient Details  Name: Paula Dickson MRN: [id number] Date of Birth: August 30, 1966  Today's Date: 12/05/2023 Time: SLP Start Time (ACUTE ONLY): 0935 SLP Stop Time (ACUTE ONLY): 0952 SLP Time Calculation (min) (ACUTE ONLY): 17 min  Past Medical History:  Past Medical History:  Diagnosis Date   Bipolar affective disorder (HCC)    Bipolar disorder (HCC)    History of arthritis    History of chicken pox    History of depression    History of genital warts    history of heart murmur    History of high blood pressure    History of thyroid  disease    History of UTI    Hypertension    Low TSH level 07/13/2017   Schizophrenia (HCC)    Past Surgical History:  Past Surgical History:  Procedure Laterality Date   ABLATION ON ENDOMETRIOSIS     CYST REMOVAL NECK     around 11 years ago /benign   MULTIPLE TOOTH EXTRACTIONS     HPI:  Paula Dickson is a 58 y.o. 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  Noted to be positive for COVID-19 with mildly elevated lithium  level at 1.28.  Depakote  undetectable on arrival. Multifactorial toxic metabolic encephalopathy versus encephalopathy related to COVID-19 infection versus encephalopathy from her psychiatric illness or under the differentials.    Assessment / Plan / Recommendation  Clinical Impression  Pt demonstrates significantly altered mental status that impacts recognition of PO and effort. Pt awakened from sleep by SLP and could not follow commands. She had been drooling on the left and SLP needed to clear scretions. When offered sips, she initially had severe anterior spillage and poor effort. With repeated trials, verbal and visual cues pt improved, was able to accept spoons  of applesauce, sip 8 oz of water  from a straw and masticate a graham cracker. She has adequate capacity to eat and drink when alert and aware, though this is slow to come at times. Will initiate a regular diet and thin liquids though she will need assist with meals until mentation improves though no SLP f/u needed. SLP Visit Diagnosis: Dysphagia, unspecified (R13.10)    Aspiration Risk  Risk for inadequate nutrition/hydration    Diet Recommendation      Liquid Administration via: Straw;Cup Medication Administration: Whole meds with liquid Supervision: Full supervision/cueing for compensatory strategies Compensations: Slow rate;Small sips/bites Postural Changes: Seated upright at 90 degrees    Other  Recommendations Oral Care Recommendations: Oral care BID    Recommendations for follow up therapy are one component of a multi-disciplinary discharge planning process, led by the attending physician.  Recommendations may be updated based on patient status, additional functional criteria and insurance authorization.  Follow up Recommendations        Assistance Recommended at Discharge    Functional Status Assessment    Frequency and Duration            Prognosis        Swallow Study   General HPI: Paula Dickson is a 58 y.o. 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  Noted to be positive for COVID-19 with mildly elevated  lithium  level at 1.28.  Depakote  undetectable on arrival. Multifactorial toxic metabolic encephalopathy versus encephalopathy related to COVID-19 infection versus encephalopathy from her psychiatric illness or under the differentials. Type of Study: Bedside Swallow Evaluation Diet Prior to this Study: NPO Temperature Spikes Noted:  No Respiratory Status: Room air History of Recent Intubation: No Behavior/Cognition: Lethargic/Drowsy;Doesn't follow directions;Confused Oral Cavity Assessment: Excessive secretions Oral Care Completed by SLP: Yes Oral Cavity - Dentition: Adequate natural dentition Vision: Impaired for self-feeding Self-Feeding Abilities: Total assist Patient Positioning: Upright in bed Baseline Vocal Quality: Normal Volitional Cough: Cognitively unable to elicit Volitional Swallow: Unable to elicit    Oral/Motor/Sensory Function Overall Oral Motor/Sensory Function: Generalized oral weakness (doesnt follow commands, but decreased labial seal)   Ice Chips Ice chips: Not tested   Thin Liquid Thin Liquid: Impaired Presentation: Straw;Cup Oral Phase Impairments: Reduced labial seal;Poor awareness of bolus Oral Phase Functional Implications: Left anterior spillage;Left lateral sulci pocketing;Right anterior spillage    Nectar Thick Nectar Thick Liquid: Not tested   Honey Thick Honey Thick Liquid: Not tested   Puree Puree: Within functional limits   Solid     Solid: Within functional limits      Paula Dickson, Paula Dickson 12/05/2023,10:20 AM

## 2023-12-05 NOTE — Progress Notes (Signed)
 Patient is alert, oriented x1, Covid + , skin moist , intact. sacral and heels , skin intact , no redness.  lungs sound clear.  Bladder scan 260 mls. Urine out put 1200 mls.  BP elevated  and fluctuated SBP from 170 - 186  .  MD aware. monitor BP  per ordered. if SBP BP consistently elevated above 180 notified MD . @0400     BP 137/ 74 . Hr 72. Completed oral care  and bed bath, changed pure wick. Patient is on .45 % Nacl @ 100 mls. Patient is resting flat per ordered.

## 2023-12-05 NOTE — Progress Notes (Signed)
 PROGRESS NOTE        PATIENT DETAILS Name: Paula Dickson Age: 58 y.o. Sex: female Date of Birth: 1966-01-05 Admit Date: 12/02/2023 Admitting Physician Emeline CHRISTELLA Idol, DO ERE:[id number]  Brief Summary: Patient is a 58 y.o.  female with history of bipolar disorder, hyperthyroidism-please continue with altered mental status-found to have COVID-19 infection.  Significant events: 1/5>> admit to [facility]  Significant studies: 1/5>> CXR: Left basilar airspace disease. 1/5>> MRI brain: No acute intracranial abnormality 1/5>> NH4:<10 1/5>> TSH: Within normal limit 1/6>> EEG: No seizures 1/6>> UDS: Negative 1/6>> CSF: WBC 1, protein 48  Significant microbiology data: 1/5>> COVID PCR: Positive 1/5>> influenza/RSV PCR: Negative 1/6>> blood culture: Negative 1/6>> CSF culture: Negative 1/6>> CSF biofire panel: Negative  Procedures: 1/6>> fluoroscopy-guided LP  Consults: Neurology  Subjective: Awake-alert-responds/opens eyes when I call out her name-tracks my movement-not speaking fluently but did say stop when I performed a vigorous sternal rub.  Objective: Vitals: Blood pressure (!) 140/71, pulse 63, temperature 97.7 F (36.5 C), temperature source Axillary, resp. rate 11, height 5' 1 (1.549 m), weight 59.7 kg, SpO2 99%.   Exam: Gen Exam:not in any distress HEENT:atraumatic, normocephalic Chest: B/L clear to auscultation anteriorly CVS:S1S2 regular Abdomen:soft non tender, non distended Extremities:no edema Neurology: Difficult exam but moves all 4 extremities. Skin: no rash  Pertinent Labs/Radiology:    Latest Ref Rng & Units 12/05/2023    7:51 AM 12/04/2023    5:10 AM 12/03/2023    4:50 AM  CBC  WBC 4.0 - 10.5 K/uL 6.5  6.7  7.9   Hemoglobin 12.0 - 15.0 g/dL 88.1  89.2  87.5   Hematocrit 36.0 - 46.0 % 36.4  33.2  38.8   Platelets 150 - 400 K/uL 143  129  112     Lab Results  Component Value Date   NA 149 (H) 12/05/2023   K 4.2  12/05/2023   CL 116 (H) 12/05/2023   CO2 26 12/05/2023     Assessment/Plan: Acute metabolic encephalopathy Etiology unclear but suspect multifactorial-from COVID-19 infection-mild elevation in lithium  levels-mild hypernatremia-AKI all playing a role. Neuro imaging/CSF analysis/Spot EEG all negative Continue supportive care Neurology following.  Tremors Seems to be better today-she had no tremors when I walked in but when I started disturbing her-and with movement-she started having some tremors.   Unclear significance of these tremors-in any event this seems to be improving Neurology following.    COVID-19 infection On room air-minimal infiltrate seen on x-ray on left base-possible aspiration rather than from COVID. Supportive care at this point  ?  Aspiration pneumonia Small infiltrate on left lung base-could have aspirated due to encephalopathy Continue Rocephin  x 5 days total Evaluated by SLP-placed on regular diet on 1/8-will follow closely.  Hypernatremia Minimal improvement on half NS-switch to D5W Encourage oral intake Recheck electrolytes tomorrow.  Hypercalcemia Mild Probably was from dehydration (likely)-possibly from mildly elevated lithium  levels as well-however has reoccurred again-will check PTH levels with a.m. labs. Continue to monitor  AKI on CKD 2 AKI likely hemodynamically mediated Slowly improving with supportive care Continue IVF Recheck electrolytes in the morning.  PAF EKG on admit consistent with A-fib Has been sinus rhythm on telemetry TSH stable-check echo Possible that A-fib triggered by COVID-19 infection-appears brief-not sure if she is a good long-term candidate for anticoagulation- given psych issues-?  Compliance For now-monitor off  anticoagulation-once family arrives-we can have a more elaborate discussion.  History of hyperthyroidism Does not appear to be on Tapazole  any longer Not sure if she is on propranolol -on home med list TSH  within normal limits Stable for outpatient monitoring  Bipolar disorder Lithium  levels have appropriately decreased-only minimally elevated on admission Suspect both lithium /Depakote  levels could be resumed soon-will allow another day or so for mental status to improve before resuming.  Debility/deconditioning Spoke with Sister-[name]-1/7-mostly bedbound-able to take a few steps to go to the bathroom/kitchen-but basically stopped walking long distance last week of November.  Apparently she is afraid of falling.  For the past several days-mostly bedbound-with poor oral intake.  No sick contacts. PT/OT/SLP eval.  BMI: Estimated body mass index is 24.87 kg/m as calculated from the following:   Height as of this encounter: 5' 1 (1.549 m).   Weight as of this encounter: 59.7 kg.   Code status:   Code Status: Full Code   DVT Prophylaxis: heparin  injection 5,000 Units Start: 12/04/23 0000   Family Communication: Sister-[name] updated over the phone 1/7   Disposition Plan: Status is: Inpatient Remains inpatient appropriate because: Severity of illness   Planned Discharge Destination:Home health versus SNF   Diet: Diet Order             Diet regular Fluid consistency: Thin  Diet effective now                     Antimicrobial agents: Anti-infectives (From admission, onward)    Start     Dose/Rate Route Frequency Ordered Stop   12/04/23 2200  cefTRIAXone  (ROCEPHIN ) 2 g in sodium chloride  0.9 % 100 mL IVPB        2 g 200 mL/hr over 30 Minutes Intravenous Every 24 hours 12/04/23 0831     12/04/23 1400  vancomycin  (VANCOREADY) IVPB 750 mg/150 mL  Status:  Discontinued        750 mg 150 mL/hr over 60 Minutes Intravenous Every 24 hours 12/03/23 1320 12/04/23 0453   12/04/23 1400  vancomycin  (VANCOCIN ) 750 mg in sodium chloride  0.9 % 250 mL IVPB  Status:  Discontinued        750 mg 265 mL/hr over 60 Minutes Intravenous Every 24 hours 12/04/23 0453 12/04/23 0831    12/03/23 1315  cefTRIAXone  (ROCEPHIN ) 2 g in sodium chloride  0.9 % 100 mL IVPB  Status:  Discontinued        2 g 200 mL/hr over 30 Minutes Intravenous Every 12 hours 12/03/23 1306 12/04/23 0831   12/03/23 1315  vancomycin  (VANCOREADY) IVPB 1250 mg/250 mL        1,250 mg 166.7 mL/hr over 90 Minutes Intravenous  Once 12/03/23 1307 12/04/23 1310   12/03/23 1315  acyclovir  (ZOVIRAX ) 595 mg in dextrose  5 % 100 mL IVPB  Status:  Discontinued        10 mg/kg  59.7 kg 111.9 mL/hr over 60 Minutes Intravenous Every 12 hours 12/03/23 1312 12/04/23 0831   12/03/23 1300  cefTRIAXone  (ROCEPHIN ) 2 g in sodium chloride  0.9 % 100 mL IVPB  Status:  Discontinued        2 g 200 mL/hr over 30 Minutes Intravenous Daily 12/03/23 1255 12/03/23 1306        MEDICATIONS: Scheduled Meds:  heparin  injection (subcutaneous)  5,000 Units Subcutaneous Q8H   Continuous Infusions:  cefTRIAXone  (ROCEPHIN )  IV 2 g (12/04/23 2229)   dextrose  75 mL/hr at 12/05/23 1027   PRN Meds:.acetaminophen  **OR** acetaminophen ,  metoprolol  tartrate, ondansetron  **OR** ondansetron  (ZOFRAN ) IV   I have personally reviewed following labs and imaging studies  LABORATORY DATA: CBC: Recent Labs  Lab 12/02/23 1555 12/03/23 0251 12/03/23 0450 12/04/23 0510 12/05/23 0751  WBC 10.6*  --  7.9 6.7 6.5  NEUTROABS 9.0*  --   --  3.9 3.3  HGB 16.3* 9.9* 12.4 10.7* 11.8*  HCT 49.4* 29.0* 38.8 33.2* 36.4  MCV 93.4  --  96.0 95.7 97.1  PLT 150  --  112* 129* 143*    Basic Metabolic Panel: Recent Labs  Lab 12/02/23 1555 12/03/23 0251 12/03/23 0450 12/04/23 0510 12/05/23 0751  NA 141 146* 146* 150* 149*  K 4.1 4.1 4.5 4.3 4.2  CL 100  --  113* 117* 116*  CO2 25  --  24 22 26   GLUCOSE 109*  --  71 78 82  BUN 25*  --  18 15 13   CREATININE 1.56*  --  1.30* 1.43* 1.30*  CALCIUM 11.6*  --  10.0 10.1 10.6*    GFR: Estimated Creatinine Clearance: 38.7 mL/min (A) (by C-G formula based on SCr of 1.3 mg/dL (H)).  Liver  Function Tests: Recent Labs  Lab 12/02/23 1555 12/03/23 0450  AST 43* 30  ALT 41 29  ALKPHOS 204* 136*  BILITOT 0.7 0.7  PROT 8.5* 6.0*  ALBUMIN 4.2 3.0*   No results for input(s): LIPASE, AMYLASE in the last 168 hours. Recent Labs  Lab 12/02/23 2104 12/04/23 0510  AMMONIA <10 25    Coagulation Profile: No results for input(s): INR, PROTIME in the last 168 hours.  Cardiac Enzymes: Recent Labs  Lab 12/03/23 0450 12/05/23 0751  CKTOTAL 463* 45    BNP (last 3 results) No results for input(s): PROBNP in the last 8760 hours.  Lipid Profile: No results for input(s): CHOL, HDL, LDLCALC, TRIG, CHOLHDL, LDLDIRECT in the last 72 hours.  Thyroid  Function Tests: Recent Labs    12/02/23 1511  TSH 3.325    Anemia Panel: No results for input(s): VITAMINB12, FOLATE, FERRITIN, TIBC, IRON, RETICCTPCT in the last 72 hours.  Urine analysis:    Component Value Date/Time   COLORURINE STRAW (A) 12/03/2023 0519   APPEARANCEUR CLEAR 12/03/2023 0519   LABSPEC 1.006 12/03/2023 0519   PHURINE 7.0 12/03/2023 0519   GLUCOSEU NEGATIVE 12/03/2023 0519   HGBUR NEGATIVE 12/03/2023 0519   BILIRUBINUR NEGATIVE 12/03/2023 0519   BILIRUBINUR neg 12/27/2015 1421   KETONESUR NEGATIVE 12/03/2023 0519   PROTEINUR NEGATIVE 12/03/2023 0519   UROBILINOGEN 0.2 12/27/2015 1421   UROBILINOGEN 0.2 06/14/2015 0040   NITRITE NEGATIVE 12/03/2023 0519   LEUKOCYTESUR NEGATIVE 12/03/2023 0519    Sepsis Labs: Lactic Acid, Venous    Component Value Date/Time   LATICACIDVEN 0.9 12/03/2023 1911    MICROBIOLOGY: Recent Results (from the past 240 hours)  Resp panel by RT-PCR (RSV, Flu A&B, Covid) Anterior Nasal Swab     Status: Abnormal   Collection Time: 12/02/23  7:50 PM   Specimen: Anterior Nasal Swab  Result Value Ref Range Status   SARS Coronavirus 2 by RT PCR POSITIVE (A) NEGATIVE Final   Influenza A by PCR NEGATIVE NEGATIVE Final   Influenza B by PCR  NEGATIVE NEGATIVE Final    Comment: (NOTE) The Xpert Xpress SARS-CoV-2/FLU/RSV plus assay is intended as an aid in the diagnosis of influenza from Nasopharyngeal swab specimens and should not be used as a sole basis for treatment. Nasal washings and aspirates are unacceptable for Xpert Xpress SARS-CoV-2/FLU/RSV testing.  Fact Sheet for Patients: bloggercourse.com  Fact Sheet for Healthcare Providers: seriousbroker.it  This test is not yet approved or cleared by the [place]  FDA and has been authorized for detection and/or diagnosis of SARS-CoV-2 by FDA under an Emergency Use Authorization (EUA). This EUA will remain in effect (meaning this test can be used) for the duration of the COVID-19 declaration under Section 564(b)(1) of the Act, 21 U.S.C. section 360bbb-3(b)(1), unless the authorization is terminated or revoked.     Resp Syncytial Virus by PCR NEGATIVE NEGATIVE Final    Comment: (NOTE) Fact Sheet for Patients: bloggercourse.com  Fact Sheet for Healthcare Providers: seriousbroker.it  This test is not yet approved or cleared by the [place]  FDA and has been authorized for detection and/or diagnosis of SARS-CoV-2 by FDA under an Emergency Use Authorization (EUA). This EUA will remain in effect (meaning this test can be used) for the duration of the COVID-19 declaration under Section 564(b)(1) of the Act, 21 U.S.C. section 360bbb-3(b)(1), unless the authorization is terminated or revoked.  Performed at [facility], [address] [place], [place] [address]   Culture, blood (Routine X 2) w Reflex to ID Panel     Status: None (Preliminary result)   Collection Time: 12/03/23 12:57 PM   Specimen: BLOOD LEFT HAND  Result Value Ref Range Status   Specimen Description BLOOD LEFT HAND  Final   Special Requests   Final    BOTTLES DRAWN AEROBIC ONLY Blood  Culture results may not be optimal due to an inadequate volume of blood received in culture bottles   Culture   Final    NO GROWTH 2 DAYS Performed at [facility], [address], [place], [place] [address]    Report Status PENDING  Incomplete  Culture, blood (Routine X 2) w Reflex to ID Panel     Status: None (Preliminary result)   Collection Time: 12/03/23  1:02 PM   Specimen: BLOOD LEFT ARM  Result Value Ref Range Status   Specimen Description BLOOD LEFT ARM  Final   Special Requests   Final    BOTTLES DRAWN AEROBIC AND ANAEROBIC Blood Culture results may not be optimal due to an inadequate volume of blood received in culture bottles   Culture   Final    NO GROWTH 2 DAYS Performed at [facility], [address], [place], [place] [address]    Report Status PENDING  Incomplete  CSF culture w Gram Stain     Status: None (Preliminary result)   Collection Time: 12/03/23  1:46 PM   Specimen: PATH Cytology CSF; Cerebrospinal Fluid  Result Value Ref Range Status   Specimen Description CSF  Final   Special Requests NONE  Final   Gram Stain NO WBC SEEN NO ORGANISMS SEEN   Final   Culture   Final    NO GROWTH 2 DAYS Performed at [facility], [address] [place], [place] [address]    Report Status PENDING  Incomplete  Culture, Fungus without Smear     Status: None (Preliminary result)   Collection Time: 12/03/23  2:43 PM   Specimen: PATH Cytology CSF; Cerebrospinal Fluid  Result Value Ref Range Status   Specimen Description CSF  Final   Special Requests NONE  Final   Culture   Final    NO FUNGUS ISOLATED AFTER 2 DAYS Performed at [facility], [address], [place], [place] [address]    Report Status  PENDING  Incomplete  Anaerobic culture w Gram Stain     Status: None (Preliminary result)   Collection Time: 12/03/23  2:43 PM   Specimen: PATH Cytology CSF; Cerebrospinal Fluid  Result Value Ref Range Status   Specimen Description CSF  Final    Special Requests NONE  Final   Gram Stain   Final    NO WBC SEEN NO ORGANISMS SEEN Performed at [facility], [address] [place], [place] [address]    Culture   Final    NO ANAEROBES ISOLATED; CULTURE IN PROGRESS FOR 5 DAYS   Report Status PENDING  Incomplete    RADIOLOGY STUDIES/RESULTS: ECHOCARDIOGRAM COMPLETE Result Date: 12/04/2023    ECHOCARDIOGRAM REPORT   Patient Name:   SAARAH DEWING Date of Exam: 12/04/2023 Medical Rec #:  [id number]             Height:       61.0 in Accession #:    [id number]            Weight:       131.6 lb Date of Birth:  [birth year]             BSA:          1.581 m Patient Age:    57 years              BP:           150/87 mmHg Patient Gender: F                     HR:           75 bpm. Exam Location:  Inpatient Procedure: 2D Echo, Cardiac Doppler and Color Doppler Indications:    Afib  History:        Patient has prior history of Echocardiogram examinations, most                 recent 02/06/2021. Arrythmias:Atrial Fibrillation; Risk                 Factors:Hypertension.  Sonographer:    Meagan Baucom RDCS, FE, PE Referring Phys: [id number] DONALDA HERO Stokes Rattigan IMPRESSIONS  1. Left ventricular ejection fraction, by estimation, is 55 to 60%. The left ventricle has normal function. The left ventricle has no regional wall motion abnormalities. Left ventricular diastolic parameters were normal.  2. Right ventricular systolic function is normal. The right ventricular size is mildly enlarged. There is normal pulmonary artery systolic pressure. The estimated right ventricular systolic pressure is 28.8 mmHg.  3. The mitral valve is grossly normal. Trivial mitral valve regurgitation. No evidence of mitral stenosis.  4. The aortic valve is tricuspid. Aortic valve regurgitation is not visualized. No aortic stenosis is present.  5. The inferior vena cava is normal in size with greater than 50% respiratory variability, suggesting right atrial pressure of 3 mmHg. Comparison(s): No  significant change from prior study. FINDINGS  Left Ventricle: Left ventricular ejection fraction, by estimation, is 55 to 60%. The left ventricle has normal function. The left ventricle has no regional wall motion abnormalities. The left ventricular internal cavity size was normal in size. There is  no left ventricular hypertrophy. Left ventricular diastolic parameters were normal. Right Ventricle: The right ventricular size is mildly enlarged. No increase in right ventricular wall thickness. Right ventricular systolic function is normal. There is normal pulmonary artery systolic pressure. The tricuspid regurgitant velocity is 2.54  m/s, and with an assumed right  atrial pressure of 3 mmHg, the estimated right ventricular systolic pressure is 28.8 mmHg. Left Atrium: Left atrial size was normal in size. Right Atrium: Right atrial size was normal in size. Pericardium: Trivial pericardial effusion is present. Mitral Valve: The mitral valve is grossly normal. Trivial mitral valve regurgitation. No evidence of mitral valve stenosis. Tricuspid Valve: The tricuspid valve is grossly normal. Tricuspid valve regurgitation is trivial. No evidence of tricuspid stenosis. Aortic Valve: The aortic valve is tricuspid. Aortic valve regurgitation is not visualized. No aortic stenosis is present. Aortic valve mean gradient measures 3.0 mmHg. Aortic valve peak gradient measures 5.9 mmHg. Aortic valve area, by VTI measures 2.06 cm. Pulmonic Valve: The pulmonic valve was grossly normal. Pulmonic valve regurgitation is not visualized. No evidence of pulmonic stenosis. Aorta: The aortic root is normal in size and structure. Venous: The inferior vena cava is normal in size with greater than 50% respiratory variability, suggesting right atrial pressure of 3 mmHg. IAS/Shunts: There is redundancy of the interatrial septum. The atrial septum is grossly normal.  LEFT VENTRICLE PLAX 2D LVIDd:         4.60 cm   Diastology LVIDs:         2.90 cm    LV e' medial:    7.72 cm/s LV PW:         1.00 cm   LV E/e' medial:  7.6 LV IVS:        1.00 cm   LV e' lateral:   7.29 cm/s LVOT diam:     2.00 cm   LV E/e' lateral: 8.0 LV SV:         53 LV SV Index:   33 LVOT Area:     3.14 cm  RIGHT VENTRICLE RV S prime:     18.60 cm/s TAPSE (M-mode): 1.8 cm LEFT ATRIUM             Index        RIGHT ATRIUM           Index LA diam:        4.00 cm 2.53 cm/m   RA Area:     13.20 cm LA Vol (A2C):   30.6 ml 19.35 ml/m  RA Volume:   27.20 ml  17.20 ml/m LA Vol (A4C):   42.3 ml 26.75 ml/m LA Biplane Vol: 36.2 ml 22.90 ml/m  AORTIC VALVE AV Area (Vmax):    2.20 cm AV Area (Vmean):   2.15 cm AV Area (VTI):     2.06 cm AV Vmax:           121.00 cm/s AV Vmean:          80.200 cm/s AV VTI:            0.256 m AV Peak Grad:      5.9 mmHg AV Mean Grad:      3.0 mmHg LVOT Vmax:         84.75 cm/s LVOT Vmean:        54.950 cm/s LVOT VTI:          0.168 m LVOT/AV VTI ratio: 0.66  AORTA Ao Root diam: 2.80 cm MITRAL VALVE               TRICUSPID VALVE MV Area (PHT): 4.31 cm    TR Peak grad:   25.8 mmHg MV Decel Time: 176 msec    TR Vmax:        254.00 cm/s MV E velocity: 58.30 cm/s  MV A velocity: 79.70 cm/s  SHUNTS MV E/A ratio:  0.73        Systemic VTI:  0.17 m                            Systemic Diam: 2.00 cm Darryle Decent MD Electronically signed by Darryle Decent MD Signature Date/Time: 12/04/2023/3:29:40 PM    Final    DG FL GUIDED LUMBAR PUNCTURE Result Date: 12/03/2023 CLINICAL DATA:  58 year old female presents with altered mental status, hypothermia and concern for neuroleptic malignant syndrome. EXAM: DIAGNOSTIC LUMBAR PUNCTURE UNDER FLUOROSCOPIC GUIDANCE COMPARISON:  None Available. FLUOROSCOPY: Radiation Exposure Index (as provided by the fluoroscopic device): 3.2 mGy Kerma PROCEDURE: Informed consent was obtained from the patient's sister Miss Winton Mccahill prior to the procedure, including potential risks and complications of headache, allergy, pain, bleeding, infection,  damage to adjacent structures, spinal canal hematoma, CSF leak which may require additional procedure, and low yield. Time-out was performed to verify correct patient and correct procedure. Allergies were reviewed. With the patient prone, the lower back was prepped with Betadine. 1% Lidocaine  was used for local anesthesia. Lumbar puncture was performed at the L2-L3 level using a 3.5 inch, 20 gauge needle with return of clear, colorless CSF with an opening pressure of 14 cm water . 20 ml of CSF were obtained for laboratory studies. The spinal needle was removed, sterile bandage was placed. The patient tolerated the procedure well and there were no apparent complications. IMPRESSION: Technically successful fluoro guided lumbar puncture. This procedure was performed by Toya Cousin, PA-C under supervision of Juliene Balder, MD. Electronically Signed   By: Juliene Balder M.D.   On: 12/03/2023 15:36     LOS: 2 days   Donalda Applebaum, MD  [facility]    To contact the attending provider between 7A-7P or the covering provider during after hours 7P-7A, please log into the web site [email or web address] and access using universal [place] password for that web site. If you do not have the password, please call the hospital operator.  12/05/2023, 11:53 AM

## 2023-12-05 NOTE — Plan of Care (Signed)

## 2023-12-05 NOTE — Evaluation (Signed)
 Physical Therapy Evaluation Patient Details Name: Paula Dickson MRN: [id number] DOB: 1966/09/02 Today's Date: 12/05/2023  History of Present Illness  58 y.o.  female with history of bipolar disorder, hyperthyroidism-please continue with altered mental status-found to have COVID-19 infection. PMH significant for tremors, acute metabolic encephalopathy, asipiration PNA, hypernatremia, hypercalcemia, AKI on CKD 2, PAF, hyperthyroidism, BPD, debility  Clinical Impression  Pt admitted with/for COVID and not sure of baseline functioning , but pt needing mod/max assist of 2 persons for basic mobility in/around the bed.  Pt currently limited functionally due to the problems listed. ( See problems list.)   Pt will benefit from PT to maximize function and safety in order to get ready for next venue listed below.         If plan is discharge home, recommend the following: A lot of help with walking and/or transfers;A lot of help with bathing/dressing/bathroom;Assistance with cooking/housework;Assist for transportation   Can travel by private vehicle   No    Equipment Recommendations  (TBD)  Recommendations for Other Services       Functional Status Assessment Patient has had a recent decline in their functional status and demonstrates the ability to make significant improvements in function in a reasonable and predictable amount of time.     Precautions / Restrictions Precautions Precautions: Fall Restrictions Weight Bearing Restrictions Per Provider Order: No      Mobility  Bed Mobility Overal bed mobility: Needs Assistance Bed Mobility: Supine to Sit, Sit to Supine     Supine to sit: Total assist, +2 for physical assistance, HOB elevated Sit to supine: Total assist, +2 for physical assistance   General bed mobility comments: total for in/out of bed, not able to move BUE/BLEs to assist in meaningful way. Pt has enough core strength to maintain positioning when sitting up  in/around BOS, but has difficulty returning to midline from any point outside BOS    Transfers Overall transfer level: Needs assistance Equipment used: 2 person hand held assist Transfers: Sit to/from Stand Sit to Stand: Mod assist, +2 physical assistance           General transfer comment: mod A x2 HHA for STS, able to take side steps with max effort and cueing.    Ambulation/Gait               General Gait Details: side stepping with mod of 2 toward [facility], difficulty with w/shift and stepping.  Stairs            Wheelchair Mobility     Tilt Bed    Modified Rankin (Stroke Patients Only)       Balance Overall balance assessment: Needs assistance Sitting-balance support: No upper extremity supported, Feet supported Sitting balance-Leahy Scale: Fair Sitting balance - Comments: able to maintain balance sitting EOB   Standing balance support: Bilateral upper extremity supported, During functional activity, Reliant on assistive device for balance Standing balance-Leahy Scale: Poor Standing balance comment: reliant on x2 HHA for standing, mod A x2                             Pertinent Vitals/Pain Pain Assessment Pain Assessment: Faces Faces Pain Scale: No hurt Pain Intervention(s): Monitored during session    Home Living Family/patient expects to be discharged to:: Private residence Living Arrangements: Alone                 Additional Comments: Per notes Pt lives alone and  family checks on her, last seen normal on 1/3, checked on 1/5 and was unresponsive.    Prior Function Prior Level of Function : Independent/Modified Independent                     Extremity/Trunk Assessment   Upper Extremity Assessment Upper Extremity Assessment: Defer to OT evaluation    Lower Extremity Assessment Lower Extremity Assessment: Generalized weakness (LE's more functional than UE's)    Cervical / Trunk Assessment Cervical / Trunk  Assessment: Kyphotic  Communication   Communication Communication: Difficulty following commands/understanding;Difficulty communicating thoughts/reduced clarity of speech  Cognition Arousal: Alert Behavior During Therapy: Flat affect Overall Cognitive Status: Impaired/Different from baseline Area of Impairment: Following commands                       Following Commands: Follows one step commands inconsistently       General Comments: Pt difficult to assess, able to follow simple commands <50% of time, limited due to generalized weakness/stiffness. Able to nod yes/no, but inconsistent with answers        General Comments      Exercises     Assessment/Plan    PT Assessment Patient needs continued PT services  PT Problem List Decreased strength;Decreased activity tolerance;Decreased balance;Decreased mobility;Decreased coordination       PT Treatment Interventions Gait training;Functional mobility training;Therapeutic activities;Balance training;Patient/family education    PT Goals (Current goals can be found in the Care Plan section)  Acute Rehab PT Goals Patient Stated Goal: pt unable to participate PT Goal Formulation: Patient unable to participate in goal setting Time For Goal Achievement: 12/19/23 Potential to Achieve Goals: Fair    Frequency Min 1X/week     Co-evaluation PT/OT/SLP Co-Evaluation/Treatment: Yes Reason for Co-Treatment: Complexity of the patient's impairments (multi-system involvement);Necessary to address cognition/behavior during functional activity;For patient/therapist safety;To address functional/ADL transfers PT goals addressed during session: Mobility/safety with mobility OT goals addressed during session: ADL's and self-care       AM-PAC PT 6 Clicks Mobility  Outcome Measure Help needed turning from your back to your side while in a flat bed without using bedrails?: Total Help needed moving from lying on your back to sitting  on the side of a flat bed without using bedrails?: Total Help needed moving to and from a bed to a chair (including a wheelchair)?: Total Help needed standing up from a chair using your arms (e.g., wheelchair or bedside chair)?: Total Help needed to walk in hospital room?: Total Help needed climbing 3-5 steps with a railing? : Total 6 Click Score: 6    End of Session   Activity Tolerance: Patient limited by fatigue Patient left: in bed;with call bell/phone within reach Nurse Communication: Mobility status PT Visit Diagnosis: Unsteadiness on feet (R26.81);Other abnormalities of gait and mobility (R26.89);Muscle weakness (generalized) (M62.81)    Time: 8954-8884 PT Time Calculation (min) (ACUTE ONLY): 30 min   Charges:   PT Evaluation $PT Eval Moderate Complexity: 1 Mod   PT General Charges $$ ACUTE PT VISIT: 1 Visit         12/05/2023  India HERO., PT Acute Rehabilitation Services [phone]  (office)  Vinie GAILS Carston Riedl 12/05/2023, 4:36 PM

## 2023-12-06 DIAGNOSIS — G9341 Metabolic encephalopathy: Secondary | ICD-10-CM | POA: Diagnosis not present

## 2023-12-06 DIAGNOSIS — N179 Acute kidney failure, unspecified: Secondary | ICD-10-CM | POA: Diagnosis not present

## 2023-12-06 DIAGNOSIS — U071 COVID-19: Secondary | ICD-10-CM | POA: Diagnosis not present

## 2023-12-06 LAB — CBC WITH DIFFERENTIAL/PLATELET
Abs Immature Granulocytes: 0.04 10*3/uL (ref 0.00–0.07)
Basophils Absolute: 0 10*3/uL (ref 0.0–0.1)
Basophils Relative: 0 %
Eosinophils Absolute: 0.2 10*3/uL (ref 0.0–0.5)
Eosinophils Relative: 3 %
HCT: 38.9 % (ref 36.0–46.0)
Hemoglobin: 12.6 g/dL (ref 12.0–15.0)
Immature Granulocytes: 1 %
Lymphocytes Relative: 38 %
Lymphs Abs: 2.6 10*3/uL (ref 0.7–4.0)
MCH: 31.1 pg (ref 26.0–34.0)
MCHC: 32.4 g/dL (ref 30.0–36.0)
MCV: 96 fL (ref 80.0–100.0)
Monocytes Absolute: 0.9 10*3/uL (ref 0.1–1.0)
Monocytes Relative: 13 %
Neutro Abs: 3.2 10*3/uL (ref 1.7–7.7)
Neutrophils Relative %: 45 %
Platelets: 139 10*3/uL — ABNORMAL LOW (ref 150–400)
RBC: 4.05 MIL/uL (ref 3.87–5.11)
RDW: 15.8 % — ABNORMAL HIGH (ref 11.5–15.5)
WBC: 6.9 10*3/uL (ref 4.0–10.5)
nRBC: 0 % (ref 0.0–0.2)

## 2023-12-06 LAB — BASIC METABOLIC PANEL
Anion gap: 9 (ref 5–15)
BUN: 16 mg/dL (ref 6–20)
CO2: 23 mmol/L (ref 22–32)
Calcium: 10.5 mg/dL — ABNORMAL HIGH (ref 8.9–10.3)
Chloride: 109 mmol/L (ref 98–111)
Creatinine, Ser: 1.26 mg/dL — ABNORMAL HIGH (ref 0.44–1.00)
GFR, Estimated: 49 mL/min — ABNORMAL LOW (ref >60)
Glucose, Bld: 115 mg/dL — ABNORMAL HIGH (ref 70–99)
Potassium: 4.4 mmol/L (ref 3.5–5.1)
Sodium: 141 mmol/L (ref 135–145)

## 2023-12-06 LAB — CSF CULTURE W GRAM STAIN: Gram Stain: NONE SEEN

## 2023-12-06 MED ORDER — DIVALPROEX SODIUM ER 500 MG PO TB24
500.0000 mg | ORAL_TABLET | Freq: Every day | ORAL | Status: DC
  Administered 2023-12-07: 500 mg via ORAL
  Filled 2023-12-06: qty 1

## 2023-12-06 MED ORDER — DIVALPROEX SODIUM 500 MG PO DR TAB
500.0000 mg | DELAYED_RELEASE_TABLET | Freq: Every day | ORAL | Status: DC
  Administered 2023-12-06: 500 mg via ORAL
  Filled 2023-12-06: qty 1

## 2023-12-06 MED ORDER — ENSURE ENLIVE PO LIQD
237.0000 mL | Freq: Two times a day (BID) | ORAL | Status: DC
  Administered 2023-12-06 – 2023-12-14 (×17): 237 mL via ORAL

## 2023-12-06 NOTE — Progress Notes (Signed)
 NEUROLOGY CONSULT FOLLOW UP NOTE   Date of service: December 06, 2023 Patient Name: Paula Dickson MRN:  [id number] DOB:  September 09, 1966   Interval Hx/subjective  Seen and examined.   Vitals   Vitals:   12/05/23 1955 12/06/23 0000 12/06/23 0400 12/06/23 0800  BP: (!) 163/91 (!) 144/92 112/77 127/73  Pulse: 99 76 68 60  Resp: (!) 9 13 18 13   Temp: 98.4 F (36.9 C) 97.9 F (36.6 C) 98.6 F (37 C) 98.1 F (36.7 C)  TempSrc: Oral Oral Oral Oral  SpO2: 97% 98%  99%  Weight:      Height:         Body mass index is 24.87 kg/m.  Physical Exam  GEN: Well-developed well-nourished woman comfortably laying in bed in no acute distress. HEENT: Normocephalic atraumatic CVS: Regular rate rhythm Respiratory: Clear chest Neurological exam Comfortably sleeping in bed Opens eyes to voice Able to tell me her name today Able to tell me her age Able to tell me that she is in the hospital Follow simple commands Has a distractible and stimulus induced tremor in both upper extremities.  Legs were without tremor until the point that I started to passively raise them. Cranial nerves: Pupils equal round reactive to light, extraocular movements appear unhindered, face appears symmetric. Motor examination with a distractible tremor as above.  Is able to move both extremities away in response to noxious stimulation. Sensory: As above Cerebellar difficult to assess given that she does not necessarily follow commands consistently  Medications  Current Facility-Administered Medications:    acetaminophen  (TYLENOL ) tablet 650 mg, 650 mg, Oral, Q6H PRN **OR** acetaminophen  (TYLENOL ) suppository 650 mg, 650 mg, Rectal, Q6H PRN, Lonzell, Jared M, DO   cefTRIAXone  (ROCEPHIN ) 2 g in sodium chloride  0.9 % 100 mL IVPB, 2 g, Intravenous, Q24H, Ghimire, Donalda HERO, MD, Last Rate: 200 mL/hr at 12/05/23 2235, 2 g at 12/05/23 2235   dextrose  5 % solution, , Intravenous, Continuous, Ghimire, Donalda HERO, MD, Last  Rate: 75 mL/hr at 12/06/23 0019, New Bag at 12/06/23 0019   feeding supplement (ENSURE ENLIVE / ENSURE PLUS) liquid 237 mL, 237 mL, Oral, BID BM, Hall, Carole N, DO   heparin  injection 5,000 Units, 5,000 Units, Subcutaneous, Q8H, Samtani, Jai-Gurmukh, MD, 5,000 Units at 12/06/23 9381   hydrALAZINE  (APRESOLINE ) injection 10 mg, 10 mg, Intravenous, Q6H PRN, Raenelle Donalda HERO, MD, 10 mg at 12/05/23 1853   metoprolol  tartrate (LOPRESSOR ) injection 5 mg, 5 mg, Intravenous, Q6H PRN, Lonzell, Jared M, DO   ondansetron  (ZOFRAN ) tablet 4 mg, 4 mg, Oral, Q6H PRN **OR** ondansetron  (ZOFRAN ) injection 4 mg, 4 mg, Intravenous, Q6H PRN, Lonzell Emeline HERO, DO Labs and Diagnostic Imaging   CBC:  Recent Labs  Lab 12/04/23 0510 12/05/23 0751  WBC 6.7 6.5  NEUTROABS 3.9 3.3  HGB 10.7* 11.8*  HCT 33.2* 36.4  MCV 95.7 97.1  PLT 129* 143*    Basic Metabolic Panel:  Lab Results  Component Value Date   NA 149 (H) 12/05/2023   K 4.2 12/05/2023   CO2 26 12/05/2023   GLUCOSE 82 12/05/2023   BUN 13 12/05/2023   CREATININE 1.30 (H) 12/05/2023   CALCIUM 10.6 (H) 12/05/2023   GFRNONAA 47 (L) 12/05/2023   GFRAA >60 02/07/2020   Lipid Panel:  Lab Results  Component Value Date   LDLCALC 56 11/24/2021   HgbA1c:  Lab Results  Component Value Date   HGBA1C 6.1 (H) 11/24/2021   Urine Drug Screen:  Component Value Date/Time   LABOPIA NONE DETECTED 12/03/2023 0519   COCAINSCRNUR NONE DETECTED 12/03/2023 0519   LABBENZ NONE DETECTED 12/03/2023 0519   AMPHETMU NONE DETECTED 12/03/2023 0519   THCU NONE DETECTED 12/03/2023 0519   LABBARB NONE DETECTED 12/03/2023 0519    Alcohol  Level     Component Value Date/Time   ETH <10 09/21/2022 1315   INR  Lab Results  Component Value Date   INR 1.0 03/18/2021   APTT  Lab Results  Component Value Date   APTT 28 02/03/2021   AED levels:  Lab Results  Component Value Date   LAMOTRIGINE  2.6 12/23/2019   CT head, MRI head unremarkable for acute  process.  Fluoroscopy guided LP performed CSF: RBC 1 and 8, WBC 1 and 1, cells too few to count, total protein 48, glucose 47.  Meningitis/encephalitis panel negative  Lithium  level was 1.28 now down to 0.58.  Valproate was undetectable on arrival.   ID: SARS-CoV-2 positive  Routine EEG-generalized slowing.  Moderate diffuse encephalopathy  Assessment   Paula Dickson is a 58 y.o. 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  Noted to be positive for COVID-19 with mildly elevated lithium  level at 1.28.  Depakote  undetectable on arrival.  Although concern for CNS infection was low, in discussion with the hospitalist yesterday, spinal tap was performed which had unremarkable results and negative meningitis/encephalitis panel.  Her tremors seem to be stimulus induced and distractible raising possibility for psychogenic etiology but could be from some amount of lithium  toxicity in the setting of acute toxic metabolic derangements.  Also had hypercalcemia.  Multifactorial toxic metabolic encephalopathy versus encephalopathy related to COVID-19 infection versus encephalopathy from her psychiatric illness or under the differentials.    Recommendations  As of right now, I would recommend continuing supportive care. Monitor CK levels and lithium  levels.  I would recommend checking lithium  level tomorrow.  Also repeat CK tomorrow. Supportive care per primary team as you are Was restarted on meningitic/encephalitic coverage which was discontinued after the CSF studies did not reveal any evidence of infection I will defer psychiatric medications per psychiatry-may need inpatient consultation to optimize psychiatric medications.   Neurology will be available as  needed. Plan discussed with Dr. Raenelle. ______________________________________________________________________   Bonney Eligio Lav, MD [facility]

## 2023-12-06 NOTE — TOC Initial Note (Addendum)
 Transition of Care [facility]) - Initial/Assessment Note    Patient Details  Name: Paula Dickson MRN: [id number] Date of Birth: March 04, 1966  [facility] of [facility]) CM/SW Contact:    Inocente GORMAN Kindle, LCSW Phone Number: 12/06/2023, 12:00 PM  Clinical Narrative:                 12pm-CSW received consult for possible SNF placement at time of discharge. CSW left voicemail for patient's sister to discuss. Placement barriers include COVID+ status, age, and psychiatric history.  12:19 PM- [name] returned call and reported agreement with SNF plan as she is in her 38s and cannot care for both her sister and her brother. CSW explained barriers and will send referrals out for review. Pasrr is pending.     Skilled Nursing Rehab Facilities-   [email or web address] Ratings out of 5 stars (the highest)   Name Address  Phone # Quality Care Staffing Health Inspection Overall  [facility] [address], [place] [phone] 2 2 5 5   [facility] [address], [place] [phone] 4 2 4 4   [facility] [address], [facility] [phone] 2 1 2 1   [facility] [address] [place] [phone] 3 1 4 3   [facility]  [address], [place] [phone] 4 4 5 5   [facility] [address] [facility] [phone] 3 2 2 2   [facility] [address], [place] [phone] 5 1 2 2   [facility] [address], [place] [phone] 1 1 3 1   [address] ([place]) [address], [place] [phone] 2 2 2 2   [facility] [address] [place], [place] [phone] 2 2 1 1   [facility] ([place]) [address], [place] [phone] 3 1 1 1   Clotilda Pereyra [address] [place] [phone] 3 3 4 4   [facility] [address], [place] [phone] [facility] [address] [place] [phone] 4 2 1 1    [facility] [address], [place] [phone] 2 2 2 2   [facility] [place] [address] [place] [phone] [place] [address] [place] [address], [place] [phone] 1 1 2 1   [facility] [address] [employer] [phone] 2 1 4 3           [address] (no [facility]) [address], [place] [phone] 4 4 5 5   [facility] (No Humana) [address] [place] [phone] 1 2 4 3   [facility] (No [employer]) [address], [place] [phone] 4 1 5 4   [facility] [address], [employer] [phone] 3 4 2 2   [facility] [address] [place] [phone] 2 1 2 1   [facility] [address], [place] [phone] 2 1 1 1   [place] [address] [place] [phone] 4 2 5 5   [facility]  [address] [place] [phone] 2 2 3 3   [facility] [address] [place] [phone] 4 1 1 1   [facility] [address] [place], [place] [phone] 2 2 2 2           [facility] [address] [place] [phone] 2 1 1 1   [facility] [address], [place]  [phone] 3 3 3 3   [facility] [address], [place] [phone] 4 3 5 5   [facility] [place]  [address], [place] [phone] 2 1 1 1   [facility] (No Humana) [address], [place] [phone] 2 2 4 4   [facility] [address], [place] [phone] 2 1 2 1           [facility] [address] [place], [place] [phone] 5 4 5 5   [facility])  [address] [place] [phone] 1 1 2 1   [facility]) [address] [place] [phone]  2 4 4   [facility] [address], [place] [phone] 3 5 5 5   [address] [place], [place] [phone] 4 2 2 2   [facility]) [address] [place] [phone] 2 1 3 2      Expected Discharge Plan: Skilled Nursing  Facility Barriers to Discharge: Continued Medical Work up, English As A Second Language Teacher, SNF Pending bed offer, SNF Covid   Patient Goals and CMS Choice Patient states their goals for this hospitalization and ongoing recovery are:: Rehab CMS Medicare.gov Compare Post Acute Care list provided to:: Patient Represenative (must comment) Choice offered to / list presented to : Sibling [place] ownership interest in [facility].provided to:: Sibling    Expected Discharge Plan and Services In-house Referral: Clinical Social Work   Post Acute Care Choice: Skilled Nursing Facility Living arrangements for the past 2 months: Single Family Home                                      Prior Living Arrangements/Services Living arrangements for the past 2 months: Single Family Home   Patient language and need for interpreter reviewed:: Yes Do you feel safe going back to the place where you live?: Yes      Need for Family Participation in Patient Care: Yes (Comment) Care giver support system in place?: Yes (comment)   Criminal Activity/Legal Involvement Pertinent to Current Situation/Hospitalization: No - Comment as needed  Activities of Daily Living   ADL Screening (condition at time of admission) Independently performs ADLs?: Yes (appropriate for developmental age) Is the patient deaf or have difficulty hearing?: No Does the patient have difficulty seeing, even when wearing glasses/contacts?: No Does the patient have difficulty concentrating, remembering, or making decisions?: No  Permission Sought/Granted Permission sought to share information with : Facility Medical Sales Representative, Family Supports Permission granted to share information with : No  Share Information with NAME: W[place]  Permission granted to share info w AGENCY: SNFs  Permission granted to share info w Relationship: Sister  Permission granted to share info w Contact Information: [phone]  Emotional  Assessment Appearance:: Appears older than stated age Attitude/Demeanor/Rapport: Unable to Assess Affect (typically observed): Unable to Assess Orientation: : Oriented to Self Alcohol  / Substance Use: Not Applicable Psych Involvement: No (comment)  Admission diagnosis:  Altered mental status, unspecified altered mental status type [R41.82] Acute metabolic encephalopathy [G93.41] COVID [U07.1] Patient Active Problem List   Diagnosis Date Noted   COVID-19 virus infection 12/02/2023   Sinus tachycardia 08/10/2022   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) 11/27/2021   Malnutrition of moderate degree 03/26/2021   Encounter for intubation    Encephalopathy acute    Respiratory failure (HCC)    Schizophrenia (HCC)    Overdose 03/18/2021   Paroxysmal atrial fibrillation (HCC)    Hypernatremia 02/03/2021   AKI (acute kidney injury) (HCC) 02/03/2021   Schizoaffective disorder, bipolar type (HCC)  02/08/2020   Acute metabolic encephalopathy 11/17/2019   AMS (altered mental status) 11/17/2019   Lithium  toxicity 11/17/2019   Hyperthyroidism 10/14/2019   Medication side effect, initial encounter    Schizoaffective disorder (HCC) 03/13/2019   Severe mixed bipolar 1 disorder without psychosis (HCC) 03/13/2019   Bipolar affective disorder, current episode mixed (HCC) 11/01/2018   Toxic encephalopathy 07/14/2017   Suicide attempt (HCC) 07/13/2017   Low TSH level 07/13/2017   Overdose of psychotropic 07/12/2017   Valproic acid  toxicity 07/12/2017   Hypercalcemia 07/12/2017   Hyperammonemia (HCC) 07/12/2017   Schizophrenia (HCC) 07/12/2017   Bipolar disorder, most recent episode depressed (HCC) 07/12/2017   Benign essential HTN 02/21/2017   Hyperlipidemia, mixed 02/21/2017   Insomnia 12/27/2015   Abnormal urinalysis 12/27/2015   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 [place] [address] Phone: [phone] Fax: [phone]  [id number] [place], [place] - [address] [place] [address] Phone: [phone] Fax: [phone]     Social Drivers of Health (SDOH) Social History: SDOH Screenings   Alcohol  Screen: Low Risk  (11/23/2021)  Tobacco Use: Low Risk  (12/02/2023)   SDOH Interventions:     Readmission Risk Interventions     No data to display

## 2023-12-06 NOTE — Plan of Care (Signed)
  Problem: Education: Goal: Knowledge of General Education information will improve Description Including pain rating scale, medication(s)/side effects and non-pharmacologic comfort measures Outcome: Progressing   Problem: Clinical Measurements: Goal: Ability to maintain clinical measurements within normal limits will improve Outcome: Progressing   Problem: Activity: Goal: Risk for activity intolerance will decrease Outcome: Progressing   Problem: Skin Integrity: Goal: Risk for impaired skin integrity will decrease Outcome: Progressing   

## 2023-12-06 NOTE — Progress Notes (Signed)
 Physical Therapy Treatment Patient Details Name: Paula Dickson MRN: [id number] DOB: August 18, 1966 Today's Date: 12/06/2023   History of Present Illness 58 y.o.  female admitted 12/02/23 with altered mental status-found to have COVID-19 infection. PMH significant for tremors, acute metabolic encephalopathy, asipiration PNA, hypernatremia, hypercalcemia, AKI on CKD 2, PAF, hyperthyroidism, BPD, debility, schizophrenia    PT Comments  Patient awake, alert on arrival. Unable to state her name, place, or reason for hospital admission. Able to assist with AAROM exercises of LEs. Rolling rt and lt total assist with pt reaching toward rail without success in grasping rail to either side.     If plan is discharge home, recommend the following: Assistance with cooking/housework;Assist for transportation;Two people to help with walking and/or transfers;Assistance with feeding;Direct supervision/assist for medications management;Direct supervision/assist for financial management;Supervision due to cognitive status   Can travel by private vehicle     No  Equipment Recommendations   (TBD)    Recommendations for Other Services       Precautions / Restrictions Precautions Precautions: Fall Restrictions Weight Bearing Restrictions Per Provider Order: No     Mobility  Bed Mobility Overal bed mobility: Needs Assistance Bed Mobility: Rolling Rolling: Total assist         General bed mobility comments: rolling rt and lt for pad placement with total assist; EOB not attempted without +2 and pt with poor ability to follow commands/assist    Transfers                        Ambulation/Gait                   Stairs             Wheelchair Mobility     Tilt Bed    Modified Rankin (Stroke Patients Only)       Balance                                            Cognition Arousal: Alert Behavior During Therapy: Flat affect Overall Cognitive  Status: Impaired/Different from baseline Area of Impairment: Following commands                       Following Commands: Follows one step commands inconsistently       General Comments: Pt difficult to assess, able to follow simple commands <50% of time, limited due to generalized weakness/stiffness. Able to nod yes/no, but inconsistent with answers; very little vocalization attempted and unintelligible        Exercises General Exercises - Lower Extremity Ankle Circles/Pumps: AAROM, Both, 10 reps Heel Slides: AAROM, Both, 5 reps Hip ABduction/ADduction: AAROM, Both, 5 reps    General Comments        Pertinent Vitals/Pain Pain Assessment Pain Assessment: No/denies pain    Home Living                          Prior Function            PT Goals (current goals can now be found in the care plan section) Acute Rehab PT Goals Patient Stated Goal: pt unable to participate Time For Goal Achievement: 12/19/23 Potential to Achieve Goals: Fair Progress towards PT goals: Not progressing toward goals - comment (AMS with poor ability to follow commands)  Frequency    Min 1X/week      PT Plan      Co-evaluation              AM-PAC PT 6 Clicks Mobility   Outcome Measure  Help needed turning from your back to your side while in a flat bed without using bedrails?: Total Help needed moving from lying on your back to sitting on the side of a flat bed without using bedrails?: Total Help needed moving to and from a bed to a chair (including a wheelchair)?: Total Help needed standing up from a chair using your arms (e.g., wheelchair or bedside chair)?: Total Help needed to walk in hospital room?: Total Help needed climbing 3-5 steps with a railing? : Total 6 Click Score: 6    End of Session   Activity Tolerance: Treatment limited secondary to medical complications (Comment) (decr cognition) Patient left: in bed;with call bell/phone within  reach;with bed alarm set Nurse Communication: Mobility status PT Visit Diagnosis: Unsteadiness on feet (R26.81);Other abnormalities of gait and mobility (R26.89);Muscle weakness (generalized) (M62.81)     Time: 8877-8860 PT Time Calculation (min) (ACUTE ONLY): 17 min  Charges:    $Therapeutic Exercise: 8-22 mins PT General Charges $$ ACUTE PT VISIT: 1 Visit                      Paula Dickson, PT Acute Rehabilitation Services  Office [phone]    Paula Dickson 12/06/2023, 11:51 AM

## 2023-12-06 NOTE — Progress Notes (Signed)
 RE: Paula Dickson. Paula Dickson Date of Birth: 2066/02/13 Date: 12/06/23  Please be advised that the above-named patient will require a short-term nursing home stay - anticipated 30 days or less for rehabilitation and strengthening.  The plan is for return home.

## 2023-12-06 NOTE — NC FL2 (Signed)
 [place]  MEDICAID FL2 LEVEL OF CARE FORM     IDENTIFICATION  Patient Name: Paula Dickson Birthdate: [birth year] Sex: female Admission Date (Current Location): 12/02/2023  [facility] and [place] Number:  Producer, Television/film/video and Address:  [facility] [place], 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) 11/27/2021   Malnutrition of moderate degree 03/26/2021   Encounter for intubation    Encephalopathy acute    Respiratory failure (HCC)    Schizophrenia (HCC)    Overdose 03/18/2021   Paroxysmal atrial fibrillation (HCC)    Hypernatremia 02/03/2021   AKI (acute kidney injury) (HCC) 02/03/2021   Schizoaffective disorder, bipolar type (HCC) 02/08/2020   Acute metabolic encephalopathy 11/17/2019   AMS (altered mental status) 11/17/2019   Lithium  toxicity 11/17/2019   Hyperthyroidism 10/14/2019   Medication side effect, initial encounter    Schizoaffective disorder (HCC) 03/13/2019   Severe mixed bipolar 1 disorder without psychosis (HCC) 03/13/2019   Bipolar affective disorder, current episode mixed (HCC) 11/01/2018   Toxic encephalopathy 07/14/2017   Suicide attempt (HCC) 07/13/2017   Low TSH level 07/13/2017   Overdose of psychotropic 07/12/2017   Valproic acid  toxicity 07/12/2017   Hypercalcemia 07/12/2017   Hyperammonemia (HCC) 07/12/2017   Schizophrenia (HCC) 07/12/2017   Bipolar disorder, most recent episode depressed (HCC) 07/12/2017    Benign essential HTN 02/21/2017   Hyperlipidemia, mixed 02/21/2017   Insomnia 12/27/2015   Abnormal urinalysis 12/27/2015   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   Severe manic bipolar 1 disorder with psychotic behavior (HCC) 06/14/2015    Orientation RESPIRATION BLADDER Height & Weight     Self  Normal Incontinent, External catheter Weight: 131 lb 9.8 oz (59.7 kg) Height:  5' 1 (154.9 cm)  BEHAVIORAL SYMPTOMS/MOOD NEUROLOGICAL BOWEL NUTRITION STATUS      Continent Diet (See dc summary)  AMBULATORY STATUS COMMUNICATION OF NEEDS Skin   Extensive Assist Verbally Normal                       Personal Care Assistance Level of Assistance  Bathing, Feeding, Dressing Bathing Assistance: Maximum assistance Feeding assistance: Limited assistance Dressing Assistance: Limited assistance     Functional Limitations Info             SPECIAL CARE FACTORS FREQUENCY  PT (By licensed PT), OT (By licensed OT)     PT Frequency: 5x/week OT Frequency: 5x/week            Contractures Contractures Info: Not present    Additional Factors Info  Code Status, Allergies, Isolation Precautions Code Status Info: Full Allergies Info: NKA     Isolation Precautions Info: COVID+ on 12/02/23     Current Medications (12/06/2023):  This is the current hospital active medication list Current Facility-Administered Medications  Medication Dose Route Frequency Provider Last Rate Last Admin   acetaminophen  (TYLENOL ) tablet 650 mg  650 mg Oral Q6H PRN Lonzell Emeline HERO, DO  Or   acetaminophen  (TYLENOL ) suppository 650 mg  650 mg Rectal Q6H PRN Lonzell Emeline HERO, DO       cefTRIAXone  (ROCEPHIN ) 2 g in sodium chloride  0.9 % 100 mL IVPB  2 g Intravenous Q24H Raenelle Donalda HERO, MD 200 mL/hr at 12/05/23 2235 2 g at 12/05/23 2235   feeding supplement (ENSURE ENLIVE / ENSURE PLUS) liquid 237 mL  237 mL Oral BID BM Shona Laurence N, DO   237 mL at 12/06/23 1110   heparin  injection 5,000 Units  5,000 Units  Subcutaneous Q8H Samtani, Jai-Gurmukh, MD   5,000 Units at 12/06/23 9381   hydrALAZINE  (APRESOLINE ) injection 10 mg  10 mg Intravenous Q6H PRN Raenelle Donalda HERO, MD   10 mg at 12/05/23 1853   metoprolol  tartrate (LOPRESSOR ) injection 5 mg  5 mg Intravenous Q6H PRN Lonzell Emeline HERO, DO       ondansetron  (ZOFRAN ) tablet 4 mg  4 mg Oral Q6H PRN Lonzell Emeline HERO, DO       Or   ondansetron  (ZOFRAN ) injection 4 mg  4 mg Intravenous Q6H PRN Gardner, Jared M, DO         Discharge Medications: Please see discharge summary for a list of discharge medications.  Relevant Imaging Results:  Relevant Lab Results:   Additional Information ss#[id number].  Arnold Depinto S Alphons Burgert, LCSW

## 2023-12-06 NOTE — Progress Notes (Addendum)
 Patient is alert, orientedx1, tremor. PT has 237 mls  ensure and 200 mls water . Lungs sound clear, oxygen sat 99 at RA. Bladder scan 525 mls. Md notified.  Urine out put 900 mls with In and out cath per order. Stable blood pressure after midnight until this morning BP 112/77 hr 68.

## 2023-12-06 NOTE — Consult Note (Addendum)
 [facility] Face-to-Face Psychiatry Consult   Reason for Consult:  Psychosis Referring Physician:  Dr. CHRISTOBAL Guadalajara Patient Identification: Paula Dickson MRN:  [id number] Principal Diagnosis: Acute metabolic encephalopathy Diagnosis:  Principal Problem:   Acute metabolic encephalopathy Active Problems:   Hypercalcemia   Hyperthyroidism   Schizoaffective disorder, bipolar type (HCC)   AKI (acute kidney injury) (HCC)   Paroxysmal atrial fibrillation (HCC)   COVID-19 virus infection   Total Time spent with patient: 45 minutes + 30 minutes= total 75 minutes.   Subjective:   Paula Dickson is a 58 y.o. female patient with a history of schizoaffective disorder-bipolar type who presents to [facility] due to confucian and lethargy behavior. Psych consult placed for  psych history on Lithium /Zyprexa  and Depakote . Came in with confusion. Lithium  levels are mildly elevated. Discussed with neurology wondering if you can see her and see if we should resume her lithium  and other medication.   Patient was seen in room awake, alert and oriented.  While she is taking her medication, and appears to be improving from initial psychiatric presentation by attending psychiatrist. She is able to engage in conversation, although not a fluid conversation she is able to have one. This morning she did exhibit some thought blocking, but suspect this is likely due to her cognitive impairment.   On today's reassessment patient did not present with any focal neurological deficit, he was able to speak clearly, follow commands, and denies weakness.   At this current time, patient's pre-existing condition that was substantially aggravated prior to this admission has returned to previous level.  Patient further states that her current health at this present time seems to be normal. She request that no changes be made to her medications at this time.   She does continue to endorse some mental fog, overall assessing is doing  better.  She does present with decrease in attention or concentration, She is alert and oriented x 3, shows some short-term memory deficit.  Although she is able to recall his previous actions that resulted in hospitalization, she would also like to stay and get stronger for rehab.  I can't walk right now and I need to be able to walk before I go home.  She is not lethargic on today's exam, shows linear thought processes, and answers all questions appropriately.  At this time it is felt that patient has returned to psychiatric baseline. She denies suicidal ideation, homicidal ideation, and or auditory or visual hallucinations   Past Psychiatric History:  Schizoaffective disorder, Bipolar type, Generalized anxiety disorder.  Multiple inpatient hospitalizations. Inpatient at [facility] 08/23, [facility] 02/2022, [facility] 03/2022, [facility] 08/2022, 12/202, 09/2020.   Past Medical History:  Past Medical History:  Diagnosis Date   Bipolar affective disorder (HCC)    Bipolar disorder (HCC)    History of arthritis    History of chicken pox    History of depression    History of genital warts    history of heart murmur    History of high blood pressure    History of thyroid  disease    History of UTI    Hypertension    Low TSH level 07/13/2017   Schizophrenia (HCC)     Past Surgical History:  Procedure Laterality Date   ABLATION ON ENDOMETRIOSIS     CYST REMOVAL NECK     around 11 years ago /benign   MULTIPLE TOOTH EXTRACTIONS     Family History:  Family History  Problem Relation Age  of Onset   Arthritis Father    Hyperlipidemia Father    High blood pressure Father    Diabetes Sister    Diabetes Mother    Diabetes Brother    Mental illness Brother    Alcohol  abuse Paternal Uncle    Alcohol  abuse Paternal Grandfather    Breast cancer Maternal Aunt    Breast cancer Paternal Aunt    High blood pressure Sister    Mental illness Other        runs in family   Family  Psychiatric  History:  Social History:  Social History   Substance and Sexual Activity  Alcohol  Use Not Currently     Social History   Substance and Sexual Activity  Drug Use Not Currently    Social History   Socioeconomic History   Marital status: Single    Spouse name: Not on file   Number of children: Not on file   Years of education: Not on file   Highest education level: Patient refused  Occupational History   Not on file  Tobacco Use   Smoking status: Never   Smokeless tobacco: Never  Vaping Use   Vaping status: Never Used  Substance and Sexual Activity   Alcohol  use: Not Currently   Drug use: Not Currently   Sexual activity: Not Currently  Other Topics Concern   Not on file  Social History Narrative   ** Merged History Encounter **       Social Drivers of Health   Financial Resource Strain: Not on file  Food Insecurity: Not on file  Transportation Needs: Not on file  Physical Activity: Not on file  Stress: Not on file  Social Connections: Not on file   Additional Social History:    Allergies:  No Known Allergies  Labs:  Results for orders placed or performed during the hospital encounter of 12/02/23 (from the past 48 hours)  Basic metabolic panel     Status: Abnormal   Collection Time: 12/05/23  7:51 AM  Result Value Ref Range   Sodium 149 (H) 135 - 145 mmol/L   Potassium 4.2 3.5 - 5.1 mmol/L   Chloride 116 (H) 98 - 111 mmol/L   CO2 26 22 - 32 mmol/L   Glucose, Bld 82 70 - 99 mg/dL    Comment: Glucose reference range applies only to samples taken after fasting for at least 8 hours.   BUN 13 6 - 20 mg/dL   Creatinine, Ser 8.69 (H) 0.44 - 1.00 mg/dL   Calcium 89.3 (H) 8.9 - 10.3 mg/dL   GFR, Estimated 47 (L) >60 mL/min    Comment: (NOTE) Calculated using the CKD-EPI Creatinine Equation (2021)    Anion gap 7 5 - 15    Comment: Performed at [facility], [address], [place], [place] [address]  CBC with Differential/Platelet      Status: Abnormal   Collection Time: 12/05/23  7:51 AM  Result Value Ref Range   WBC 6.5 4.0 - 10.5 K/uL   RBC 3.75 (L) 3.87 - 5.11 MIL/uL   Hemoglobin 11.8 (L) 12.0 - 15.0 g/dL   HCT 63.5 63.9 - 53.9 %   MCV 97.1 80.0 - 100.0 fL   MCH 31.5 26.0 - 34.0 pg   MCHC 32.4 30.0 - 36.0 g/dL   RDW 83.9 (H) 88.4 - 84.4 %   Platelets 143 (L) 150 - 400 K/uL    Comment: REPEATED TO VERIFY   nRBC 0.0 0.0 -  0.2 %   Neutrophils Relative % 51 %   Neutro Abs 3.3 1.7 - 7.7 K/uL   Lymphocytes Relative 32 %   Lymphs Abs 2.1 0.7 - 4.0 K/uL   Monocytes Relative 14 %   Monocytes Absolute 0.9 0.1 - 1.0 K/uL   Eosinophils Relative 1 %   Eosinophils Absolute 0.1 0.0 - 0.5 K/uL   Basophils Relative 1 %   Basophils Absolute 0.0 0.0 - 0.1 K/uL   Immature Granulocytes 1 %   Abs Immature Granulocytes 0.05 0.00 - 0.07 K/uL    Comment: Performed at [facility], [address], [place], [place] [address]  Lithium  level     Status: Abnormal   Collection Time: 12/05/23  7:51 AM  Result Value Ref Range   Lithium  Lvl 0.58 (L) 0.60 - 1.20 mmol/L    Comment: Performed at [facility], [address] [place], [place] [address]  CK     Status: None   Collection Time: 12/05/23  7:51 AM  Result Value Ref Range   Total CK 45 38 - 234 U/L    Comment: Performed at [facility], [address] [place], [place] [address]  Basic metabolic panel     Status: Abnormal   Collection Time: 12/06/23  8:54 AM  Result Value Ref Range   Sodium 141 135 - 145 mmol/L    Comment: REPEATED TO VERIFY   Potassium 4.4 3.5 - 5.1 mmol/L   Chloride 109 98 - 111 mmol/L   CO2 23 22 - 32 mmol/L   Glucose, Bld 115 (H) 70 - 99 mg/dL    Comment: Glucose reference range applies only to samples taken after fasting for at least 8 hours.   BUN 16 6 - 20 mg/dL   Creatinine, Ser 8.73 (H) 0.44 - 1.00 mg/dL   Calcium 89.4 (H) 8.9 - 10.3 mg/dL   GFR, Estimated 49 (L) >60 mL/min    Comment: (NOTE) Calculated using the CKD-EPI  Creatinine Equation (2021)    Anion gap 9 5 - 15    Comment: Performed at [facility], [address] [place], [place] [address]  CBC with Differential/Platelet     Status: Abnormal   Collection Time: 12/06/23  8:54 AM  Result Value Ref Range   WBC 6.9 4.0 - 10.5 K/uL   RBC 4.05 3.87 - 5.11 MIL/uL   Hemoglobin 12.6 12.0 - 15.0 g/dL   HCT 61.0 63.9 - 53.9 %   MCV 96.0 80.0 - 100.0 fL   MCH 31.1 26.0 - 34.0 pg   MCHC 32.4 30.0 - 36.0 g/dL   RDW 84.1 (H) 88.4 - 84.4 %   Platelets 139 (L) 150 - 400 K/uL    Comment: REPEATED TO VERIFY   nRBC 0.0 0.0 - 0.2 %   Neutrophils Relative % 45 %   Neutro Abs 3.2 1.7 - 7.7 K/uL   Lymphocytes Relative 38 %   Lymphs Abs 2.6 0.7 - 4.0 K/uL   Monocytes Relative 13 %   Monocytes Absolute 0.9 0.1 - 1.0 K/uL   Eosinophils Relative 3 %   Eosinophils Absolute 0.2 0.0 - 0.5 K/uL   Basophils Relative 0 %   Basophils Absolute 0.0 0.0 - 0.1 K/uL   Immature Granulocytes 1 %   Abs Immature Granulocytes 0.04 0.00 - 0.07 K/uL    Comment: Performed at [facility], [address], [place], [place] [address]    Current Facility-Administered Medications  Medication Dose Route Frequency Provider Last Rate Last Admin   acetaminophen  (TYLENOL ) tablet 650 mg  650 mg Oral Q6H PRN Lonzell Emeline HERO, DO       Or   acetaminophen  (TYLENOL ) suppository 650 mg  650 mg Rectal Q6H PRN Lonzell Emeline HERO, DO       cefTRIAXone  (ROCEPHIN ) 2 g in sodium chloride  0.9 % 100 mL IVPB  2 g Intravenous Q24H Raenelle Donalda HERO, MD 200 mL/hr at 12/05/23 2235 2 g at 12/05/23 2235   divalproex  (DEPAKOTE ) DR tablet 500 mg  500 mg Oral QHS Ghimire, Donalda HERO, MD       feeding supplement (ENSURE ENLIVE / ENSURE PLUS) liquid 237 mL  237 mL Oral BID BM Hall, Carole N, DO   237 mL at 12/06/23 1702   heparin  injection 5,000 Units  5,000 Units Subcutaneous Q8H Samtani, Jai-Gurmukh, MD   5,000 Units at 12/06/23 1702   hydrALAZINE  (APRESOLINE ) injection 10 mg  10 mg Intravenous Q6H  PRN Raenelle Donalda HERO, MD   10 mg at 12/05/23 1853   metoprolol  tartrate (LOPRESSOR ) injection 5 mg  5 mg Intravenous Q6H PRN Lonzell Emeline HERO, DO       ondansetron  (ZOFRAN ) tablet 4 mg  4 mg Oral Q6H PRN Lonzell Emeline HERO, DO       Or   ondansetron  (ZOFRAN ) injection 4 mg  4 mg Intravenous Q6H PRN Lonzell Emeline HERO, DO        Musculoskeletal: Strength & Muscle Tone: within normal limits Gait & Station: normal Patient leans: N/A  Psychiatric Specialty Exam:  Presentation  General Appearance: Appropriate for Environment; Casual   Eye Contact:Good   Speech:Clear and Coherent; Normal Rate   Speech Volume: normal  Handedness:Right    Mood and Affect  Mood:Euthymic   Affect:Congruent; Appropriate    Thought Process  Thought Processes:Coherent; Linear   Descriptions of Associations:Intact   Orientation:Full (Time, Place and Person)   Thought Content:Logical   History of Schizophrenia/Schizoaffective disorder: yes  Duration of Psychotic Symptoms: > 6 months  Hallucinations:Hallucinations: None   Ideas of Reference: none  Suicidal Thoughts:Suicidal Thoughts: No   Homicidal Thoughts:Homicidal Thoughts: No    Sensorium  Memory:Immediate Fair; Recent Fair; Remote Fair   Judgment:Fair   Insight:Fair    Executive Functions  Concentration:Fair   Attention Span:Poor   Recall:Poor   Fund of Knowledge:Fair   Language:Fair    Psychomotor Activity  Psychomotor Activity:Psychomotor Activity: Normal    Assets  Assets:Financial Resources/Insurance; Housing   Sleep  Sleep:Sleep: Good   Physical Exam: Physical Exam Vitals and nursing note reviewed.  Constitutional:      Appearance: Normal appearance. She is normal weight.  Eyes:     Pupils: Pupils are equal, round, and reactive to light.  Skin:    General: Skin is warm.     Capillary Refill: Capillary refill takes less than 2 seconds.  Neurological:     General: No focal  deficit present.     Mental Status: She is alert and oriented to person, place, and time. Mental status is at baseline.  Psychiatric:        Mood and Affect: Mood normal.        Behavior: Behavior normal.        Thought Content: Thought content normal.        Judgment: Judgment normal.    Review of Systems  Psychiatric/Behavioral:  The patient is nervous/anxious.   All other systems reviewed and are negative.  Blood pressure  127/73, pulse 60, temperature 98.1 F (36.7 C), temperature source Oral, resp. rate 13, height 5' 1 (1.549 m), weight 59.7 kg, SpO2 99%. Body mass index is 24.87 kg/m.  Treatment Plan Summary: Daily contact with patient to assess and evaluate symptoms and progress in treatment, Medication management, and Plan   -To prevent further decompensation of schizophrenia and bipolar disorder, restart Depakote  as prescribed and will continue to hole lithium  and olanzapine . May resume Olanzapine   once sodium has returned to normal limits.  -Recommend treating thyroid  issues that are likely result of her lithium .  In addition to the patient has history of severe hypernatremia when acutely psychotic, likely due to poor oral intake and diminish thirst mechanism. -Psychiatry consult service will continue to follow at this time, seeing one more time before we sign off.    Labs reviewed: Including abnormal sodium of 150, elevated creatinine 1.43, elevated calcium 10.5, elevated CRP 3.3, elevated lithium  level of 1.28--> .58,  VPA level < 10 LFT's wnl  Disposition: No evidence of imminent risk to self or others at present.   Patient does not meet criteria for psychiatric inpatient admission. Supportive therapy provided about ongoing stressors. Discussed crisis plan, support from social network, calling 911, coming to the Emergency Department, and calling Suicide Hotline.  Majel GORMAN Ramp, FNP 12/06/2023 5:15 PM   I have reviewed the note by NP Starkes-Perry, and discussed  the plan of care. I independently evaluated the patient and provided consultation with attending IM provider and addended the note. I am in agreement with the assessment and plan to discontinue Lithium  due to ongoing risk related to accidental overdose; restart Depakote  DR 500 mg once at HS and then will change to Depakote  ER 500 mg at HS to simplify medication burden.  Check VPA level on 12/10/23. Will not restart Zyprexa  at this time, but it can be considered for agitation. Do NOT restart home cogentin  or Ativan , as these likely contribute to worsening altered mental status.  Patient would benefit with discharge to and Assisted Living Facility.  DOROTHE CHRISTELLA GRIFFINS, MD

## 2023-12-06 NOTE — Progress Notes (Signed)
 PROGRESS NOTE        PATIENT DETAILS Name: Paula Dickson Age: 58 y.o. Sex: female Date of Birth: 1965-12-23 Admit Date: 12/02/2023 Admitting Physician Emeline CHRISTELLA Idol, DO ERE:[email or web address]  Brief Summary: Patient is a 58 y.o.  female with history of bipolar disorder, hyperthyroidism-please continue with altered mental status-found to have COVID-19 infection.  Significant events: 1/5>> admit to [facility]  Significant studies: 1/5>> CXR: Left basilar airspace disease. 1/5>> MRI brain: No acute intracranial abnormality 1/5>> NH4:<10 1/5>> TSH: Within normal limit 1/6>> EEG: No seizures 1/6>> UDS: Negative 1/6>> CSF: WBC 1, protein 48 1/7>> echo: EF 55-60%.  Significant microbiology data: 1/5>> COVID PCR: Positive 1/5>> influenza/RSV PCR: Negative 1/6>> blood culture: Negative 1/6>> CSF culture: Negative 1/6>> CSF biofire panel: Negative  Procedures: 1/6>> fluoroscopy-guided LP  Consults: Neurology Psychiatry  Subjective: Much more awake today-able to say a few words today.  Objective: Vitals: Blood pressure 127/73, pulse 60, temperature 98.1 F (36.7 C), temperature source Oral, resp. rate 13, height 5' 1 (1.549 m), weight 59.7 kg, SpO2 99%.   Exam: Gen Exam:Alert awake-not in any distress HEENT:atraumatic, normocephalic Chest: B/L clear to auscultation anteriorly CVS:S1S2 regular Abdomen:soft non tender, non distended Extremities:no edema Neurology: Non focal-but has generalized weakness. Skin: no rash  Pertinent Labs/Radiology:    Latest Ref Rng & Units 12/06/2023    8:54 AM 12/05/2023    7:51 AM 12/04/2023    5:10 AM  CBC  WBC 4.0 - 10.5 K/uL 6.9  6.5  6.7   Hemoglobin 12.0 - 15.0 g/dL 87.3  88.1  89.2   Hematocrit 36.0 - 46.0 % 38.9  36.4  33.2   Platelets 150 - 400 K/uL 139  143  129     Lab Results  Component Value Date   NA 141 12/06/2023   K 4.4 12/06/2023   CL 109 12/06/2023   CO2 23 12/06/2023      Assessment/Plan: Acute metabolic encephalopathy Etiology unclear but suspect multifactorial-from COVID-19 infection-mild elevation in lithium  levels-mild hypernatremia-AKI all playing a role. Neuro imaging/CSF analysis/Spot EEG all negative Improving with supportive care-and treatment of underlying etiologies Appreciate neurology input.  Tremors Much better-minimal tremors-mostly intentional. Unclear significance Neurology following.    COVID-19 infection On room air-minimal infiltrate seen on x-ray on left base-possible aspiration rather than from COVID. Supportive care at this point  ?  Aspiration pneumonia Small infiltrate on left lung base-could have aspirated due to encephalopathy Continue Rocephin  x 5 days total Evaluated by SLP-placed on regular diet on 1/8-will follow closely.  Hypernatremia Improved after starting D5W Stop D5W-and encourage oral intake.    Hypercalcemia Mild Probably was from dehydration (likely)-possibly from mildly elevated lithium   Continues to have mild hypercalcemia-PTH levels pending.  AKI on CKD 2 AKI likely hemodynamically mediated Slowly improving with supportive care Follow electrolytes closely.  PAF EKG on admit consistent with A-fib Has been sinus rhythm on telemetry TSH stable With stable EF Possible that A-fib triggered by COVID-19 infection-appears brief-not sure if she is a good long-term candidate for anticoagulation- given psych issues-?  Compliance For now-monitor off anticoagulation  History of hyperthyroidism Does not appear to be on Tapazole  any longer Not sure if she is on propranolol -on home med list TSH within normal limits Stable for outpatient monitoring  Bipolar disorder Lithium  levels were slightly supratherapeutic on admission-Depakote  levels were low I have consulted psychiatry  for medication management-recommendations are to continue to hold lithium -and okay to restart Depakote .    Debility/deconditioning Spoke with Sister-[name]-1/7-mostly bedbound-able to take a few steps to go to the bathroom/kitchen-but basically stopped walking long distance last week of November.  Apparently she is afraid of falling.  For the past several days-mostly bedbound-with poor oral intake.  No sick contacts. PT/OT/SLP eval-SNF commended.  BMI: Estimated body mass index is 24.87 kg/m as calculated from the following:   Height as of this encounter: 5' 1 (1.549 m).   Weight as of this encounter: 59.7 kg.   Code status:   Code Status: Full Code   DVT Prophylaxis: heparin  injection 5,000 Units Start: 12/04/23 0000   Family Communication: Sister-[name] updated over the phone 1/9   Disposition Plan: Status is: Inpatient Remains inpatient appropriate because: Severity of illness   Planned Discharge Destination: SNF   Diet: Diet Order             Diet regular Fluid consistency: Thin  Diet effective now                     Antimicrobial agents: Anti-infectives (From admission, onward)    Start     Dose/Rate Route Frequency Ordered Stop   12/04/23 2200  cefTRIAXone  (ROCEPHIN ) 2 g in sodium chloride  0.9 % 100 mL IVPB        2 g 200 mL/hr over 30 Minutes Intravenous Every 24 hours 12/04/23 0831     12/04/23 1400  vancomycin  (VANCOREADY) IVPB 750 mg/150 mL  Status:  Discontinued        750 mg 150 mL/hr over 60 Minutes Intravenous Every 24 hours 12/03/23 1320 12/04/23 0453   12/04/23 1400  vancomycin  (VANCOCIN ) 750 mg in sodium chloride  0.9 % 250 mL IVPB  Status:  Discontinued        750 mg 265 mL/hr over 60 Minutes Intravenous Every 24 hours 12/04/23 0453 12/04/23 0831   12/03/23 1315  cefTRIAXone  (ROCEPHIN ) 2 g in sodium chloride  0.9 % 100 mL IVPB  Status:  Discontinued        2 g 200 mL/hr over 30 Minutes Intravenous Every 12 hours 12/03/23 1306 12/04/23 0831   12/03/23 1315  vancomycin  (VANCOREADY) IVPB 1250 mg/250 mL        1,250 mg 166.7 mL/hr over  90 Minutes Intravenous  Once 12/03/23 1307 12/04/23 1310   12/03/23 1315  acyclovir  (ZOVIRAX ) 595 mg in dextrose  5 % 100 mL IVPB  Status:  Discontinued        10 mg/kg  59.7 kg 111.9 mL/hr over 60 Minutes Intravenous Every 12 hours 12/03/23 1312 12/04/23 0831   12/03/23 1300  cefTRIAXone  (ROCEPHIN ) 2 g in sodium chloride  0.9 % 100 mL IVPB  Status:  Discontinued        2 g 200 mL/hr over 30 Minutes Intravenous Daily 12/03/23 1255 12/03/23 1306        MEDICATIONS: Scheduled Meds:  feeding supplement  237 mL Oral BID BM   heparin  injection (subcutaneous)  5,000 Units Subcutaneous Q8H   Continuous Infusions:  cefTRIAXone  (ROCEPHIN )  IV 2 g (12/05/23 2235)   PRN Meds:.acetaminophen  **OR** acetaminophen , hydrALAZINE , metoprolol  tartrate, ondansetron  **OR** ondansetron  (ZOFRAN ) IV   I have personally reviewed following labs and imaging studies  LABORATORY DATA: CBC: Recent Labs  Lab 12/02/23 1555 12/03/23 0251 12/03/23 0450 12/04/23 0510 12/05/23 0751 12/06/23 0854  WBC 10.6*  --  7.9 6.7 6.5 6.9  NEUTROABS 9.0*  --   --  3.9 3.3 3.2  HGB 16.3* 9.9* 12.4 10.7* 11.8* 12.6  HCT 49.4* 29.0* 38.8 33.2* 36.4 38.9  MCV 93.4  --  96.0 95.7 97.1 96.0  PLT 150  --  112* 129* 143* 139*    Basic Metabolic Panel: Recent Labs  Lab 12/02/23 1555 12/03/23 0251 12/03/23 0450 12/04/23 0510 12/05/23 0751 12/06/23 0854  NA 141 146* 146* 150* 149* 141  K 4.1 4.1 4.5 4.3 4.2 4.4  CL 100  --  113* 117* 116* 109  CO2 25  --  24 22 26 23   GLUCOSE 109*  --  71 78 82 115*  BUN 25*  --  18 15 13 16   CREATININE 1.56*  --  1.30* 1.43* 1.30* 1.26*  CALCIUM 11.6*  --  10.0 10.1 10.6* 10.5*    GFR: Estimated Creatinine Clearance: 39.9 mL/min (A) (by C-G formula based on SCr of 1.26 mg/dL (H)).  Liver Function Tests: Recent Labs  Lab 12/02/23 1555 12/03/23 0450  AST 43* 30  ALT 41 29  ALKPHOS 204* 136*  BILITOT 0.7 0.7  PROT 8.5* 6.0*  ALBUMIN 4.2 3.0*   No results for input(s):  LIPASE, AMYLASE in the last 168 hours. Recent Labs  Lab 12/02/23 2104 12/04/23 0510  AMMONIA <10 25    Coagulation Profile: No results for input(s): INR, PROTIME in the last 168 hours.  Cardiac Enzymes: Recent Labs  Lab 12/03/23 0450 12/05/23 0751  CKTOTAL 463* 45    BNP (last 3 results) No results for input(s): PROBNP in the last 8760 hours.  Lipid Profile: No results for input(s): CHOL, HDL, LDLCALC, TRIG, CHOLHDL, LDLDIRECT in the last 72 hours.  Thyroid  Function Tests: No results for input(s): TSH, T4TOTAL, FREET4, T3FREE, THYROIDAB in the last 72 hours.   Anemia Panel: No results for input(s): VITAMINB12, FOLATE, FERRITIN, TIBC, IRON, RETICCTPCT in the last 72 hours.  Urine analysis:    Component Value Date/Time   COLORURINE STRAW (A) 12/03/2023 0519   APPEARANCEUR CLEAR 12/03/2023 0519   LABSPEC 1.006 12/03/2023 0519   PHURINE 7.0 12/03/2023 0519   GLUCOSEU NEGATIVE 12/03/2023 0519   HGBUR NEGATIVE 12/03/2023 0519   BILIRUBINUR NEGATIVE 12/03/2023 0519   BILIRUBINUR neg 12/27/2015 1421   KETONESUR NEGATIVE 12/03/2023 0519   PROTEINUR NEGATIVE 12/03/2023 0519   UROBILINOGEN 0.2 12/27/2015 1421   UROBILINOGEN 0.2 06/14/2015 0040   NITRITE NEGATIVE 12/03/2023 0519   LEUKOCYTESUR NEGATIVE 12/03/2023 0519    Sepsis Labs: Lactic Acid, Venous    Component Value Date/Time   LATICACIDVEN 0.9 12/03/2023 1911    MICROBIOLOGY: Recent Results (from the past 240 hours)  Resp panel by RT-PCR (RSV, Flu A&B, Covid) Anterior Nasal Swab     Status: Abnormal   Collection Time: 12/02/23  7:50 PM   Specimen: Anterior Nasal Swab  Result Value Ref Range Status   SARS Coronavirus 2 by RT PCR POSITIVE (A) NEGATIVE Final   Influenza A by PCR NEGATIVE NEGATIVE Final   Influenza B by PCR NEGATIVE NEGATIVE Final    Comment: (NOTE) The Xpert Xpress SARS-CoV-2/FLU/RSV plus assay is intended as an aid in the diagnosis of influenza  from Nasopharyngeal swab specimens and should not be used as a sole basis for treatment. Nasal washings and aspirates are unacceptable for Xpert Xpress SARS-CoV-2/FLU/RSV testing.  Fact Sheet for Patients: bloggercourse.com  Fact Sheet for Healthcare Providers: seriousbroker.it  This test is not yet approved or cleared by the [place]  FDA and has been authorized for detection and/or diagnosis of SARS-CoV-2  by FDA under an Emergency Use Authorization (EUA). This EUA will remain in effect (meaning this test can be used) for the duration of the COVID-19 declaration under Section 564(b)(1) of the Act, 21 U.S.C. section 360bbb-3(b)(1), unless the authorization is terminated or revoked.     Resp Syncytial Virus by PCR NEGATIVE NEGATIVE Final    Comment: (NOTE) Fact Sheet for Patients: bloggercourse.com  Fact Sheet for Healthcare Providers: seriousbroker.it  This test is not yet approved or cleared by the [place]  FDA and has been authorized for detection and/or diagnosis of SARS-CoV-2 by FDA under an Emergency Use Authorization (EUA). This EUA will remain in effect (meaning this test can be used) for the duration of the COVID-19 declaration under Section 564(b)(1) of the Act, 21 U.S.C. section 360bbb-3(b)(1), unless the authorization is terminated or revoked.  Performed at [facility], [address], [place], [place] [address]   Culture, blood (Routine X 2) w Reflex to ID Panel     Status: None (Preliminary result)   Collection Time: 12/03/23 12:57 PM   Specimen: BLOOD LEFT HAND  Result Value Ref Range Status   Specimen Description BLOOD LEFT HAND  Final   Special Requests   Final    BOTTLES DRAWN AEROBIC ONLY Blood Culture results may not be optimal due to an inadequate volume of blood received in culture bottles   Culture   Final    NO GROWTH 3 DAYS Performed  at [facility], [address], [place], [place] [address]    Report Status PENDING  Incomplete  Culture, blood (Routine X 2) w Reflex to ID Panel     Status: None (Preliminary result)   Collection Time: 12/03/23  1:02 PM   Specimen: BLOOD LEFT ARM  Result Value Ref Range Status   Specimen Description BLOOD LEFT ARM  Final   Special Requests   Final    BOTTLES DRAWN AEROBIC AND ANAEROBIC Blood Culture results may not be optimal due to an inadequate volume of blood received in culture bottles   Culture   Final    NO GROWTH 3 DAYS Performed at [facility], [address] [place], [place] [address]    Report Status PENDING  Incomplete  CSF culture w Gram Stain     Status: None   Collection Time: 12/03/23  1:46 PM   Specimen: PATH Cytology CSF; Cerebrospinal Fluid  Result Value Ref Range Status   Specimen Description CSF  Final   Special Requests NONE  Final   Gram Stain NO WBC SEEN NO ORGANISMS SEEN   Final   Culture   Final    NO GROWTH 3 DAYS Performed at [facility], [address], [place], [place] [address]    Report Status 12/06/2023 FINAL  Final  Culture, Fungus without Smear     Status: None (Preliminary result)   Collection Time: 12/03/23  2:43 PM   Specimen: PATH Cytology CSF; Cerebrospinal Fluid  Result Value Ref Range Status   Specimen Description CSF  Final   Special Requests NONE  Final   Culture   Final    NO FUNGUS ISOLATED AFTER 3 DAYS Performed at [facility], [address] [place], [place] [address]    Report Status PENDING  Incomplete  Anaerobic culture w Gram Stain     Status: None (Preliminary result)   Collection Time: 12/03/23  2:43 PM   Specimen: PATH Cytology CSF; Cerebrospinal Fluid  Result Value Ref  Range Status   Specimen Description CSF  Final   Special Requests NONE  Final   Gram Stain   Final    NO WBC SEEN NO ORGANISMS SEEN Performed at [facility], [address], [place], [place] [address]     Culture   Final    NO ANAEROBES ISOLATED; CULTURE IN PROGRESS FOR 5 DAYS   Report Status PENDING  Incomplete    RADIOLOGY STUDIES/RESULTS: ECHOCARDIOGRAM COMPLETE Result Date: 12/04/2023    ECHOCARDIOGRAM REPORT   Patient Name:   Paula Dickson Date of Exam: 12/04/2023 Medical Rec #:  [id number]             Height:       61.0 in Accession #:    [id number]            Weight:       131.6 lb Date of Birth:  10/24/1966             BSA:          1.581 m Patient Age:    57 years              BP:           150/87 mmHg Patient Gender: F                     HR:           75 bpm. Exam Location:  Inpatient Procedure: 2D Echo, Cardiac Doppler and Color Doppler Indications:    Afib  History:        Patient has prior history of Echocardiogram examinations, most                 recent 02/06/2021. Arrythmias:Atrial Fibrillation; Risk                 Factors:Hypertension.  Sonographer:    Meagan Baucom RDCS, FE, PE Referring Phys: [id number] DONALDA HERO Trapper Meech IMPRESSIONS  1. Left ventricular ejection fraction, by estimation, is 55 to 60%. The left ventricle has normal function. The left ventricle has no regional wall motion abnormalities. Left ventricular diastolic parameters were normal.  2. Right ventricular systolic function is normal. The right ventricular size is mildly enlarged. There is normal pulmonary artery systolic pressure. The estimated right ventricular systolic pressure is 28.8 mmHg.  3. The mitral valve is grossly normal. Trivial mitral valve regurgitation. No evidence of mitral stenosis.  4. The aortic valve is tricuspid. Aortic valve regurgitation is not visualized. No aortic stenosis is present.  5. The inferior vena cava is normal in size with greater than 50% respiratory variability, suggesting right atrial pressure of 3 mmHg. Comparison(s): No significant change from prior study. FINDINGS  Left Ventricle: Left ventricular ejection fraction, by estimation, is 55 to 60%. The left ventricle has normal function.  The left ventricle has no regional wall motion abnormalities. The left ventricular internal cavity size was normal in size. There is  no left ventricular hypertrophy. Left ventricular diastolic parameters were normal. Right Ventricle: The right ventricular size is mildly enlarged. No increase in right ventricular wall thickness. Right ventricular systolic function is normal. There is normal pulmonary artery systolic pressure. The tricuspid regurgitant velocity is 2.54  m/s, and with an assumed right atrial pressure of 3 mmHg, the estimated right ventricular systolic pressure is 28.8 mmHg. Left Atrium: Left atrial size was normal in size. Right Atrium: Right atrial size was normal in size. Pericardium: Trivial pericardial effusion is  present. Mitral Valve: The mitral valve is grossly normal. Trivial mitral valve regurgitation. No evidence of mitral valve stenosis. Tricuspid Valve: The tricuspid valve is grossly normal. Tricuspid valve regurgitation is trivial. No evidence of tricuspid stenosis. Aortic Valve: The aortic valve is tricuspid. Aortic valve regurgitation is not visualized. No aortic stenosis is present. Aortic valve mean gradient measures 3.0 mmHg. Aortic valve peak gradient measures 5.9 mmHg. Aortic valve area, by VTI measures 2.06 cm. Pulmonic Valve: The pulmonic valve was grossly normal. Pulmonic valve regurgitation is not visualized. No evidence of pulmonic stenosis. Aorta: The aortic root is normal in size and structure. Venous: The inferior vena cava is normal in size with greater than 50% respiratory variability, suggesting right atrial pressure of 3 mmHg. IAS/Shunts: There is redundancy of the interatrial septum. The atrial septum is grossly normal.  LEFT VENTRICLE PLAX 2D LVIDd:         4.60 cm   Diastology LVIDs:         2.90 cm   LV e' medial:    7.72 cm/s LV PW:         1.00 cm   LV E/e' medial:  7.6 LV IVS:        1.00 cm   LV e' lateral:   7.29 cm/s LVOT diam:     2.00 cm   LV E/e' lateral:  8.0 LV SV:         53 LV SV Index:   33 LVOT Area:     3.14 cm  RIGHT VENTRICLE RV S prime:     18.60 cm/s TAPSE (M-mode): 1.8 cm LEFT ATRIUM             Index        RIGHT ATRIUM           Index LA diam:        4.00 cm 2.53 cm/m   RA Area:     13.20 cm LA Vol (A2C):   30.6 ml 19.35 ml/m  RA Volume:   27.20 ml  17.20 ml/m LA Vol (A4C):   42.3 ml 26.75 ml/m LA Biplane Vol: 36.2 ml 22.90 ml/m  AORTIC VALVE AV Area (Vmax):    2.20 cm AV Area (Vmean):   2.15 cm AV Area (VTI):     2.06 cm AV Vmax:           121.00 cm/s AV Vmean:          80.200 cm/s AV VTI:            0.256 m AV Peak Grad:      5.9 mmHg AV Mean Grad:      3.0 mmHg LVOT Vmax:         84.75 cm/s LVOT Vmean:        54.950 cm/s LVOT VTI:          0.168 m LVOT/AV VTI ratio: 0.66  AORTA Ao Root diam: 2.80 cm MITRAL VALVE               TRICUSPID VALVE MV Area (PHT): 4.31 cm    TR Peak grad:   25.8 mmHg MV Decel Time: 176 msec    TR Vmax:        254.00 cm/s MV E velocity: 58.30 cm/s MV A velocity: 79.70 cm/s  SHUNTS MV E/A ratio:  0.73        Systemic VTI:  0.17 m  Systemic Diam: 2.00 cm Darryle Decent MD Electronically signed by Darryle Decent MD Signature Date/Time: 12/04/2023/3:29:40 PM    Final      LOS: 3 days   Donalda Applebaum, MD  [facility]    To contact the attending provider between 7A-7P or the covering provider during after hours 7P-7A, please log into the web site [email or web address] and access using universal Many password for that web site. If you do not have the password, please call the hospital operator.  12/06/2023, 1:38 PM

## 2023-12-07 DIAGNOSIS — G9341 Metabolic encephalopathy: Secondary | ICD-10-CM | POA: Diagnosis not present

## 2023-12-07 DIAGNOSIS — N179 Acute kidney failure, unspecified: Secondary | ICD-10-CM | POA: Diagnosis not present

## 2023-12-07 DIAGNOSIS — U071 COVID-19: Secondary | ICD-10-CM | POA: Diagnosis not present

## 2023-12-07 LAB — CBC WITH DIFFERENTIAL/PLATELET
Abs Immature Granulocytes: 0.03 10*3/uL (ref 0.00–0.07)
Basophils Absolute: 0 10*3/uL (ref 0.0–0.1)
Basophils Relative: 0 %
Eosinophils Absolute: 0.3 10*3/uL (ref 0.0–0.5)
Eosinophils Relative: 4 %
HCT: 37.3 % (ref 36.0–46.0)
Hemoglobin: 11.8 g/dL — ABNORMAL LOW (ref 12.0–15.0)
Immature Granulocytes: 1 %
Lymphocytes Relative: 43 %
Lymphs Abs: 2.7 10*3/uL (ref 0.7–4.0)
MCH: 31 pg (ref 26.0–34.0)
MCHC: 31.6 g/dL (ref 30.0–36.0)
MCV: 97.9 fL (ref 80.0–100.0)
Monocytes Absolute: 0.6 10*3/uL (ref 0.1–1.0)
Monocytes Relative: 10 %
Neutro Abs: 2.7 10*3/uL (ref 1.7–7.7)
Neutrophils Relative %: 42 %
Platelets: 143 10*3/uL — ABNORMAL LOW (ref 150–400)
RBC: 3.81 MIL/uL — ABNORMAL LOW (ref 3.87–5.11)
RDW: 16.1 % — ABNORMAL HIGH (ref 11.5–15.5)
WBC: 6.3 10*3/uL (ref 4.0–10.5)
nRBC: 0 % (ref 0.0–0.2)

## 2023-12-07 LAB — BASIC METABOLIC PANEL
Anion gap: 13 (ref 5–15)
BUN: 20 mg/dL (ref 6–20)
CO2: 22 mmol/L (ref 22–32)
Calcium: 10.5 mg/dL — ABNORMAL HIGH (ref 8.9–10.3)
Chloride: 107 mmol/L (ref 98–111)
Creatinine, Ser: 1.34 mg/dL — ABNORMAL HIGH (ref 0.44–1.00)
GFR, Estimated: 46 mL/min — ABNORMAL LOW (ref >60)
Glucose, Bld: 102 mg/dL — ABNORMAL HIGH (ref 70–99)
Potassium: 4.7 mmol/L (ref 3.5–5.1)
Sodium: 142 mmol/L (ref 135–145)

## 2023-12-07 LAB — PARATHYROID HORMONE, INTACT (NO CA): PTH: 54 pg/mL (ref 15–65)

## 2023-12-07 MED ORDER — SODIUM CHLORIDE 0.9 % IV SOLN
2.0000 g | Freq: Once | INTRAVENOUS | Status: AC
  Administered 2023-12-07: 2 g via INTRAVENOUS
  Filled 2023-12-07: qty 20

## 2023-12-07 MED ORDER — LITHIUM CARBONATE ER 300 MG PO TBCR
300.0000 mg | EXTENDED_RELEASE_TABLET | Freq: Every day | ORAL | Status: DC
  Administered 2023-12-08 – 2023-12-09 (×2): 300 mg via ORAL
  Filled 2023-12-07 (×2): qty 1

## 2023-12-07 MED ORDER — SODIUM CHLORIDE 0.9 % IV SOLN: 2.0000 g | INTRAVENOUS | Status: DC

## 2023-12-07 NOTE — Consult Note (Signed)
 [facility] Face-to-Face Psychiatry Consult   Reason for Consult:  Significant psych history on lithium /Depakote /Zyprexa -came in with confusion-lithium  levels are mildly elevated-had hypercalcemia/hypernatremia-she has improved with supportive care-discussed with neurology-wondering if you all could see her and see if we should resume her lithium  and other medications.  Not sure if the electrolyte changes-mental status was from lithium  toxicity-although levels only minimally elevated.  Referring Physician:  Dr. Raenelle  Patient Identification: Paula Dickson MRN:  [id number] Principal Diagnosis: Acute metabolic encephalopathy Diagnosis:  Principal Problem:   Acute metabolic encephalopathy Active Problems:   Hypercalcemia   Hyperthyroidism   Schizoaffective disorder, bipolar type (HCC)   AKI (acute kidney injury) (HCC)   Paroxysmal atrial fibrillation (HCC)   COVID-19 virus infection   Total Time spent with patient: 30 minutes    Subjective:   Paula Dickson is a 58 y.o. female patient with a history of schizoaffective disorder-bipolar type who presents to [facility] due to confucian and lethargy behavior. Psych consult placed for  psych history on Lithium /Zyprexa  and Depakote . Came in with confusion. Lithium  levels are mildly elevated. Discussed with neurology wondering if you can see her and see if we should resume her lithium  and other medication.   Upon today's reassessment, the patient showed no signs of focal neurological deficits. She was able to speak clearly, follow commands, and denied any weakness. Currently, her pre-existing condition, which had been significantly worsened before this admission, has returned to its prior baseline. The patient reports that her health now feels normal. She expressed a preference for no changes to her medications, stating, "I was fine without Depakote . I get bubble packs, and my sister helps me." She mentioned that she has been sleeping a lot, both  here and at home, but desires to become more active. The patient is alert and oriented to person, place, and time, with some short-term memory difficulties noted. She easily awakens when her name is called and appears active, jovial, and engaged. Her responses are age-appropriate, with linear thought processes, and she answers questions appropriately. At this point, it appears that the patient has returned to her psychiatric baseline. She denies any suicidal or homicidal ideation, as well as any auditory or visual hallucinations.  Collateral Information spoke with [facility]. SHe has no concerns at this time. We reviewed plan to resume lithium  at a lower dose. She is encouraged to rmention the dose changes to [facility]. SHe states she is aware that her sister needs a lot of help from a physical standpoint, and agrees with SNF placement prior to returning home  Past Psychiatric History:  Schizoaffective disorder, Bipolar type, Generalized anxiety disorder.  Multiple inpatient hospitalizations. Inpatient at [facility] 08/23, [facility] 02/2022, [facility] 03/2022, [facility] 08/2022, 12/202, 09/2020.   Past Medical History:  Past Medical History:  Diagnosis Date   Bipolar affective disorder (HCC)    Bipolar disorder (HCC)    History of arthritis    History of chicken pox    History of depression    History of genital warts    history of heart murmur    History of high blood pressure    History of thyroid  disease    History of UTI    Hypertension    Low TSH level 07/13/2017   Schizophrenia (HCC)     Past Surgical History:  Procedure Laterality Date   ABLATION ON ENDOMETRIOSIS     CYST REMOVAL NECK     around 11 years ago /benign   MULTIPLE  TOOTH EXTRACTIONS     Family History:  Family History  Problem Relation Age of Onset   Arthritis Father    Hyperlipidemia Father    High blood pressure Father    Diabetes Sister    Diabetes Mother    Diabetes Brother     Mental illness Brother    Alcohol  abuse Paternal Uncle    Alcohol  abuse Paternal Grandfather    Breast cancer Maternal Aunt    Breast cancer Paternal Aunt    High blood pressure Sister    Mental illness Other        runs in family   Family Psychiatric  History:  Social History:  Social History   Substance and Sexual Activity  Alcohol  Use Not Currently     Social History   Substance and Sexual Activity  Drug Use Not Currently    Social History   Socioeconomic History   Marital status: Single    Spouse name: Not on file   Number of children: Not on file   Years of education: Not on file   Highest education level: Patient refused  Occupational History   Not on file  Tobacco Use   Smoking status: Never   Smokeless tobacco: Never  Vaping Use   Vaping status: Never Used  Substance and Sexual Activity   Alcohol  use: Not Currently   Drug use: Not Currently   Sexual activity: Not Currently  Other Topics Concern   Not on file  Social History Narrative   ** Merged History Encounter **       Social Drivers of Health   Financial Resource Strain: Not on file  Food Insecurity: Not on file  Transportation Needs: Not on file  Physical Activity: Not on file  Stress: Not on file  Social Connections: Not on file   Additional Social History:    Allergies:  No Known Allergies  Labs:  Results for orders placed or performed during the hospital encounter of 12/02/23 (from the past 48 hours)  Basic metabolic panel     Status: Abnormal   Collection Time: 12/06/23  8:54 AM  Result Value Ref Range   Sodium 141 135 - 145 mmol/L    Comment: REPEATED TO VERIFY   Potassium 4.4 3.5 - 5.1 mmol/L   Chloride 109 98 - 111 mmol/L   CO2 23 22 - 32 mmol/L   Glucose, Bld 115 (H) 70 - 99 mg/dL    Comment: Glucose reference range applies only to samples taken after fasting for at least 8 hours.   BUN 16 6 - 20 mg/dL   Creatinine, Ser 8.73 (H) 0.44 - 1.00 mg/dL   Calcium 89.4 (H) 8.9 -  10.3 mg/dL   GFR, Estimated 49 (L) >60 mL/min    Comment: (NOTE) Calculated using the CKD-EPI Creatinine Equation (2021)    Anion gap 9 5 - 15    Comment: Performed at [facility], [address], [place], [place] [address]  CBC with Differential/Platelet     Status: Abnormal   Collection Time: 12/06/23  8:54 AM  Result Value Ref Range   WBC 6.9 4.0 - 10.5 K/uL   RBC 4.05 3.87 - 5.11 MIL/uL   Hemoglobin 12.6 12.0 - 15.0 g/dL   HCT 61.0 63.9 - 53.9 %   MCV 96.0 80.0 - 100.0 fL   MCH 31.1 26.0 - 34.0 pg   MCHC 32.4 30.0 - 36.0 g/dL   RDW 84.1 (H) 88.4 - 84.4 %   Platelets  139 (L) 150 - 400 K/uL    Comment: REPEATED TO VERIFY   nRBC 0.0 0.0 - 0.2 %   Neutrophils Relative % 45 %   Neutro Abs 3.2 1.7 - 7.7 K/uL   Lymphocytes Relative 38 %   Lymphs Abs 2.6 0.7 - 4.0 K/uL   Monocytes Relative 13 %   Monocytes Absolute 0.9 0.1 - 1.0 K/uL   Eosinophils Relative 3 %   Eosinophils Absolute 0.2 0.0 - 0.5 K/uL   Basophils Relative 0 %   Basophils Absolute 0.0 0.0 - 0.1 K/uL   Immature Granulocytes 1 %   Abs Immature Granulocytes 0.04 0.00 - 0.07 K/uL    Comment: Performed at [facility], [address] [place], [place] [address]  Basic metabolic panel     Status: Abnormal   Collection Time: 12/07/23  5:43 AM  Result Value Ref Range   Sodium 142 135 - 145 mmol/L   Potassium 4.7 3.5 - 5.1 mmol/L   Chloride 107 98 - 111 mmol/L   CO2 22 22 - 32 mmol/L   Glucose, Bld 102 (H) 70 - 99 mg/dL    Comment: Glucose reference range applies only to samples taken after fasting for at least 8 hours.   BUN 20 6 - 20 mg/dL   Creatinine, Ser 8.65 (H) 0.44 - 1.00 mg/dL   Calcium 89.4 (H) 8.9 - 10.3 mg/dL   GFR, Estimated 46 (L) >60 mL/min    Comment: (NOTE) Calculated using the CKD-EPI Creatinine Equation (2021)    Anion gap 13 5 - 15    Comment: Performed at [facility], [address] [place], [place] [address]  CBC with Differential/Platelet     Status: Abnormal    Collection Time: 12/07/23  5:43 AM  Result Value Ref Range   WBC 6.3 4.0 - 10.5 K/uL   RBC 3.81 (L) 3.87 - 5.11 MIL/uL   Hemoglobin 11.8 (L) 12.0 - 15.0 g/dL   HCT 62.6 63.9 - 53.9 %   MCV 97.9 80.0 - 100.0 fL   MCH 31.0 26.0 - 34.0 pg   MCHC 31.6 30.0 - 36.0 g/dL   RDW 83.8 (H) 88.4 - 84.4 %   Platelets 143 (L) 150 - 400 K/uL   nRBC 0.0 0.0 - 0.2 %   Neutrophils Relative % 42 %   Neutro Abs 2.7 1.7 - 7.7 K/uL   Lymphocytes Relative 43 %   Lymphs Abs 2.7 0.7 - 4.0 K/uL   Monocytes Relative 10 %   Monocytes Absolute 0.6 0.1 - 1.0 K/uL   Eosinophils Relative 4 %   Eosinophils Absolute 0.3 0.0 - 0.5 K/uL   Basophils Relative 0 %   Basophils Absolute 0.0 0.0 - 0.1 K/uL   Immature Granulocytes 1 %   Abs Immature Granulocytes 0.03 0.00 - 0.07 K/uL    Comment: Performed at [facility], [address], [place], [place] [address]    Current Facility-Administered Medications  Medication Dose Route Frequency Provider Last Rate Last Admin   acetaminophen  (TYLENOL ) tablet 650 mg  650 mg Oral Q6H PRN Lonzell Emeline HERO, DO       Or   acetaminophen  (TYLENOL ) suppository 650 mg  650 mg Rectal Q6H PRN Lonzell Emeline HERO, DO       cefTRIAXone  (ROCEPHIN ) 2 g in sodium chloride  0.9 % 100 mL IVPB  2 g Intravenous Once Ghimire, Shanker M, MD       divalproex  (DEPAKOTE  ER) 24 hr tablet  500 mg  500 mg Oral QHS Fredia Dorothe HERO, MD   500 mg at 12/07/23 0044   feeding supplement (ENSURE ENLIVE / ENSURE PLUS) liquid 237 mL  237 mL Oral BID BM Shona Laurence N, DO   237 mL at 12/06/23 1702   heparin  injection 5,000 Units  5,000 Units Subcutaneous Q8H Samtani, Jai-Gurmukh, MD   5,000 Units at 12/07/23 0554   hydrALAZINE  (APRESOLINE ) injection 10 mg  10 mg Intravenous Q6H PRN Raenelle Donalda HERO, MD   10 mg at 12/06/23 2004   metoprolol  tartrate (LOPRESSOR ) injection 5 mg  5 mg Intravenous Q6H PRN Lonzell Emeline HERO, DO       ondansetron  (ZOFRAN ) tablet 4 mg  4 mg Oral Q6H PRN Lonzell Emeline HERO, DO       Or    ondansetron  (ZOFRAN ) injection 4 mg  4 mg Intravenous Q6H PRN Lonzell Emeline HERO, DO        Musculoskeletal: Strength & Muscle Tone: within normal limits Gait & Station: normal Patient leans: N/A  Psychiatric Specialty Exam:  Presentation  General Appearance: Appropriate for Environment; Casual   Eye Contact:Good   Speech:Clear and Coherent; Normal Rate   Speech Volume: normal  Handedness:Right    Mood and Affect  Mood:Euthymic   Affect:Congruent; Appropriate    Thought Process  Thought Processes:Coherent; Linear   Descriptions of Associations:Intact   Orientation:Full (Time, Place and Person)   Thought Content:Logical   History of Schizophrenia/Schizoaffective disorder: yes  Duration of Psychotic Symptoms: > 6 months  Hallucinations:Hallucinations: None   Ideas of Reference: none  Suicidal Thoughts:Suicidal Thoughts: No   Homicidal Thoughts:Homicidal Thoughts: No    Sensorium  Memory:Immediate Fair; Recent Fair; Remote Fair   Judgment:Fair   Insight:Fair    Executive Functions  Concentration:Fair   Attention Span:Poor   Recall:Poor   Fund of Knowledge:Fair   Language:Fair    Psychomotor Activity  Psychomotor Activity:Psychomotor Activity: Normal    Assets  Assets:Financial Resources/Insurance; Housing   Sleep  Sleep:Sleep: Good   Physical Exam: Physical Exam Vitals and nursing note reviewed.  Constitutional:      Appearance: Normal appearance. She is normal weight.  Eyes:     Pupils: Pupils are equal, round, and reactive to light.  Skin:    General: Skin is warm.     Capillary Refill: Capillary refill takes less than 2 seconds.  Neurological:     General: No focal deficit present.     Mental Status: She is alert and oriented to person, place, and time. Mental status is at baseline.  Psychiatric:        Mood and Affect: Mood normal.        Behavior: Behavior normal.        Thought Content: Thought  content normal.        Judgment: Judgment normal.    Review of Systems  Psychiatric/Behavioral:  The patient is nervous/anxious.   All other systems reviewed and are negative.  Blood pressure 138/84, pulse 70, temperature 98 F (36.7 C), temperature source Oral, resp. rate 16, height 5' 1 (1.549 m), weight 59.7 kg, SpO2 96%. Body mass index is 24.87 kg/m.  Treatment Plan Summary: Daily contact with patient to assess and evaluate symptoms and progress in treatment, Medication management, and Plan   -To prevent further decompensation of schizophrenia and bipolar disorder, restart Lithium  300mg  po daily, Will dc Depakote . Patient reports stability with Lithium , will reduce dose to prevent toxicity.   -Recommend treating thyroid  issues that are likely  result of her lithium .  In addition to the patient has history of severe hypernatremia when acutely psychotic, likely due to poor oral intake and diminish thirst mechanism. -TOC referral for SNF to help with reconditioning and strength training.    Labs reviewed: Including abnormal sodium of 150, elevated creatinine 1.43, elevated calcium 10.5, elevated CRP 3.3, elevated lithium  level of 1.28--> .58,  VPA level < 10 LFT's wnl  Disposition: No evidence of imminent risk to self or others at present.   Patient does not meet criteria for psychiatric inpatient admission. Supportive therapy provided about ongoing stressors. Discussed crisis plan, support from social network, calling 911, coming to the Emergency Department, and calling Suicide Hotline.  Majel GORMAN Ramp, FNP 12/07/2023 2:09 PM

## 2023-12-07 NOTE — Plan of Care (Signed)
  Problem: Education: Goal: Knowledge of risk factors and measures for prevention of condition will improve Outcome: Progressing   Problem: Coping: Goal: Psychosocial and spiritual needs will be supported Outcome: Progressing   Problem: Education: Goal: Knowledge of General Education information will improve Description: Including pain rating scale, medication(s)/side effects and non-pharmacologic comfort measures Outcome: Progressing   Problem: Health Behavior/Discharge Planning: Goal: Ability to manage health-related needs will improve Outcome: Progressing   Problem: Clinical Measurements: Goal: Will remain free from infection Outcome: Progressing Goal: Diagnostic test results will improve Outcome: Progressing Goal: Respiratory complications will improve Outcome: Progressing Goal: Cardiovascular complication will be avoided Outcome: Progressing   Problem: Activity: Goal: Risk for activity intolerance will decrease Outcome: Progressing   Problem: Nutrition: Goal: Adequate nutrition will be maintained Outcome: Progressing   Problem: Coping: Goal: Level of anxiety will decrease Outcome: Progressing   Problem: Safety: Goal: Ability to remain free from injury will improve Outcome: Progressing

## 2023-12-07 NOTE — Plan of Care (Signed)
  Problem: Coping: Goal: Psychosocial and spiritual needs will be supported Outcome: Progressing   Problem: Health Behavior/Discharge Planning: Goal: Ability to manage health-related needs will improve Outcome: Progressing   Problem: Activity: Goal: Risk for activity intolerance will decrease Outcome: Progressing   Problem: Coping: Goal: Level of anxiety will decrease Outcome: Progressing   Problem: Pain Management: Goal: General experience of comfort will improve Outcome: Progressing

## 2023-12-07 NOTE — Progress Notes (Signed)
 PROGRESS NOTE        PATIENT DETAILS Name: Paula Dickson Age: 58 y.o. Sex: female Date of Birth: [birth year] Admit Date: 12/02/2023 Admitting Physician Emeline CHRISTELLA Idol, DO ERE:[id number]  Brief Summary: Patient is a 58 y.o.  female with history of bipolar disorder, hyperthyroidism-please continue with altered mental status-found to have COVID-19 infection.  Significant events: 1/5>> admit to [facility]  Significant studies: 1/5>> CXR: Left basilar airspace disease. 1/5>> MRI brain: No acute intracranial abnormality 1/5>> NH4:<10 1/5>> TSH: Within normal limit 1/6>> EEG: No seizures 1/6>> UDS: Negative 1/6>> CSF: WBC 1, protein 48 1/7>> echo: EF 55-60%.  Significant microbiology data: 1/5>> COVID PCR: Positive 1/5>> influenza/RSV PCR: Negative 1/6>> blood culture: Negative 1/6>> CSF culture: Negative 1/6>> CSF biofire panel: Negative  Procedures: 1/6>> fluoroscopy-guided LP  Consults: Neurology Psychiatry  Subjective: Continues to be more awake and alert  Objective: Vitals: Blood pressure 118/89, pulse 68, temperature 98.2 F (36.8 C), temperature source Oral, resp. rate 11, height 5' 1 (1.549 m), weight 59.7 kg, SpO2 97%.   Exam: Gen Exam:Alert awake-not in any distress HEENT:atraumatic, normocephalic Chest: B/L clear to auscultation anteriorly CVS:S1S2 regular Abdomen:soft non tender, non distended Extremities:no edema Neurology: Non focal Skin: no rash  Pertinent Labs/Radiology:    Latest Ref Rng & Units 12/07/2023    5:43 AM 12/06/2023    8:54 AM 12/05/2023    7:51 AM  CBC  WBC 4.0 - 10.5 K/uL 6.3  6.9  6.5   Hemoglobin 12.0 - 15.0 g/dL 88.1  87.3  88.1   Hematocrit 36.0 - 46.0 % 37.3  38.9  36.4   Platelets 150 - 400 K/uL 143  139  143     Lab Results  Component Value Date   NA 142 12/07/2023   K 4.7 12/07/2023   CL 107 12/07/2023   CO2 22 12/07/2023     Assessment/Plan: Acute metabolic encephalopathy Etiology  unclear but suspect multifactorial-from COVID-19 infection-mild elevation in lithium  levels-mild hypernatremia-AKI all playing a role. Neuro imaging/CSF analysis/Spot EEG all negative Significantly better today-much more awake and alert and answering questions appropriately. Appreciate neurology input.  Tremors Much better-minimal tremors-mostly intentional. Unclear significance Neurology following.    COVID-19 infection On room air-minimal infiltrate seen on x-ray on left base-possible aspiration rather than from COVID. Supportive care at this point  ?  Aspiration pneumonia Small infiltrate on left lung base-could have aspirated due to encephalopathy Continue Rocephin  x 5 days total Evaluated by SLP-placed on regular diet on 1/8-will follow closely.  Hypernatremia Resolved with IVF-encourage oral intake.  Hypercalcemia Mild Probably was from dehydration (likely)-possibly from mildly elevated lithium   Continues to have mild hypercalcemia-PTH levels pending.  AKI on CKD 2 AKI likely hemodynamically mediated Slowly improving with supportive care Follow electrolytes closely.  PAF EKG on admit consistent with A-fib Has been sinus rhythm on telemetry TSH stable With stable EF Possible that A-fib triggered by COVID-19 infection-appears brief-not sure if she is a good long-term candidate for anticoagulation- given psych issues-?  Compliance For now-monitor off anticoagulation  History of hyperthyroidism Does not appear to be on Tapazole  any longer Not sure if she is on propranolol -on home med list TSH within normal limits Stable for outpatient monitoring  Bipolar disorder Appreciate psych input-Depakote  restarted-recommendations are to continue hold lithium .   Debility/deconditioning Spoke with Sister-[name]-1/7-mostly bedbound-able to take a few steps to go  to the bathroom/kitchen-but basically stopped walking long distance last week of November.  Apparently she is afraid  of falling.  For the past several days-mostly bedbound-with poor oral intake.  No sick contacts. PT/OT/SLP eval-SNF commended.  BMI: Estimated body mass index is 24.87 kg/m as calculated from the following:   Height as of this encounter: 5' 1 (1.549 m).   Weight as of this encounter: 59.7 kg.   Code status:   Code Status: Full Code   DVT Prophylaxis: heparin  injection 5,000 Units Start: 12/04/23 0000   Family Communication: Sister-[name]-[phone] updated over the phone 1/9   Disposition Plan: Status is: Inpatient Remains inpatient appropriate because: Severity of illness   Planned Discharge Destination: SNF   Diet: Diet Order             Diet regular Fluid consistency: Thin  Diet effective now                     Antimicrobial agents: Anti-infectives (From admission, onward)    Start     Dose/Rate Route Frequency Ordered Stop   12/07/23 2200  cefTRIAXone  (ROCEPHIN ) 2 g in sodium chloride  0.9 % 100 mL IVPB  Status:  Discontinued        2 g 200 mL/hr over 30 Minutes Intravenous Every 24 hours 12/07/23 0917 12/07/23 0919   12/07/23 2200  cefTRIAXone  (ROCEPHIN ) 2 g in sodium chloride  0.9 % 100 mL IVPB        2 g 200 mL/hr over 30 Minutes Intravenous  Once 12/07/23 0919     12/04/23 2200  cefTRIAXone  (ROCEPHIN ) 2 g in sodium chloride  0.9 % 100 mL IVPB  Status:  Discontinued        2 g 200 mL/hr over 30 Minutes Intravenous Every 24 hours 12/04/23 0831 12/07/23 0917   12/04/23 1400  vancomycin  (VANCOREADY) IVPB 750 mg/150 mL  Status:  Discontinued        750 mg 150 mL/hr over 60 Minutes Intravenous Every 24 hours 12/03/23 1320 12/04/23 0453   12/04/23 1400  vancomycin  (VANCOCIN ) 750 mg in sodium chloride  0.9 % 250 mL IVPB  Status:  Discontinued        750 mg 265 mL/hr over 60 Minutes Intravenous Every 24 hours 12/04/23 0453 12/04/23 0831   12/03/23 1315  cefTRIAXone  (ROCEPHIN ) 2 g in sodium chloride  0.9 % 100 mL IVPB  Status:  Discontinued        2 g 200 mL/hr  over 30 Minutes Intravenous Every 12 hours 12/03/23 1306 12/04/23 0831   12/03/23 1315  vancomycin  (VANCOREADY) IVPB 1250 mg/250 mL        1,250 mg 166.7 mL/hr over 90 Minutes Intravenous  Once 12/03/23 1307 12/04/23 1310   12/03/23 1315  acyclovir  (ZOVIRAX ) 595 mg in dextrose  5 % 100 mL IVPB  Status:  Discontinued        10 mg/kg  59.7 kg 111.9 mL/hr over 60 Minutes Intravenous Every 12 hours 12/03/23 1312 12/04/23 0831   12/03/23 1300  cefTRIAXone  (ROCEPHIN ) 2 g in sodium chloride  0.9 % 100 mL IVPB  Status:  Discontinued        2 g 200 mL/hr over 30 Minutes Intravenous Daily 12/03/23 1255 12/03/23 1306        MEDICATIONS: Scheduled Meds:  divalproex   500 mg Oral QHS   feeding supplement  237 mL Oral BID BM   heparin  injection (subcutaneous)  5,000 Units Subcutaneous Q8H   Continuous Infusions:  cefTRIAXone  (ROCEPHIN )  IV  PRN Meds:.acetaminophen  **OR** acetaminophen , hydrALAZINE , metoprolol  tartrate, ondansetron  **OR** ondansetron  (ZOFRAN ) IV   I have personally reviewed following labs and imaging studies  LABORATORY DATA: CBC: Recent Labs  Lab 12/02/23 1555 12/03/23 0251 12/03/23 0450 12/04/23 0510 12/05/23 0751 12/06/23 0854 12/07/23 0543  WBC 10.6*  --  7.9 6.7 6.5 6.9 6.3  NEUTROABS 9.0*  --   --  3.9 3.3 3.2 2.7  HGB 16.3*   < > 12.4 10.7* 11.8* 12.6 11.8*  HCT 49.4*   < > 38.8 33.2* 36.4 38.9 37.3  MCV 93.4  --  96.0 95.7 97.1 96.0 97.9  PLT 150  --  112* 129* 143* 139* 143*   < > = values in this interval not displayed.    Basic Metabolic Panel: Recent Labs  Lab 12/03/23 0450 12/04/23 0510 12/05/23 0751 12/06/23 0854 12/07/23 0543  NA 146* 150* 149* 141 142  K 4.5 4.3 4.2 4.4 4.7  CL 113* 117* 116* 109 107  CO2 24 22 26 23 22   GLUCOSE 71 78 82 115* 102*  BUN 18 15 13 16 20   CREATININE 1.30* 1.43* 1.30* 1.26* 1.34*  CALCIUM 10.0 10.1 10.6* 10.5* 10.5*    GFR: Estimated Creatinine Clearance: 37.5 mL/min (A) (by C-G formula based on SCr of  1.34 mg/dL (H)).  Liver Function Tests: Recent Labs  Lab 12/02/23 1555 12/03/23 0450  AST 43* 30  ALT 41 29  ALKPHOS 204* 136*  BILITOT 0.7 0.7  PROT 8.5* 6.0*  ALBUMIN 4.2 3.0*   No results for input(s): LIPASE, AMYLASE in the last 168 hours. Recent Labs  Lab 12/02/23 2104 12/04/23 0510  AMMONIA <10 25    Coagulation Profile: No results for input(s): INR, PROTIME in the last 168 hours.  Cardiac Enzymes: Recent Labs  Lab 12/03/23 0450 12/05/23 0751  CKTOTAL 463* 45    BNP (last 3 results) No results for input(s): PROBNP in the last 8760 hours.  Lipid Profile: No results for input(s): CHOL, HDL, LDLCALC, TRIG, CHOLHDL, LDLDIRECT in the last 72 hours.  Thyroid  Function Tests: No results for input(s): TSH, T4TOTAL, FREET4, T3FREE, THYROIDAB in the last 72 hours.   Anemia Panel: No results for input(s): VITAMINB12, FOLATE, FERRITIN, TIBC, IRON, RETICCTPCT in the last 72 hours.  Urine analysis:    Component Value Date/Time   COLORURINE STRAW (A) 12/03/2023 0519   APPEARANCEUR CLEAR 12/03/2023 0519   LABSPEC 1.006 12/03/2023 0519   PHURINE 7.0 12/03/2023 0519   GLUCOSEU NEGATIVE 12/03/2023 0519   HGBUR NEGATIVE 12/03/2023 0519   BILIRUBINUR NEGATIVE 12/03/2023 0519   BILIRUBINUR neg 12/27/2015 1421   KETONESUR NEGATIVE 12/03/2023 0519   PROTEINUR NEGATIVE 12/03/2023 0519   UROBILINOGEN 0.2 12/27/2015 1421   UROBILINOGEN 0.2 06/14/2015 0040   NITRITE NEGATIVE 12/03/2023 0519   LEUKOCYTESUR NEGATIVE 12/03/2023 0519    Sepsis Labs: Lactic Acid, Venous    Component Value Date/Time   LATICACIDVEN 0.9 12/03/2023 1911    MICROBIOLOGY: Recent Results (from the past 240 hours)  Resp panel by RT-PCR (RSV, Flu A&B, Covid) Anterior Nasal Swab     Status: Abnormal   Collection Time: 12/02/23  7:50 PM   Specimen: Anterior Nasal Swab  Result Value Ref Range Status   SARS Coronavirus 2 by RT PCR POSITIVE (A) NEGATIVE  Final   Influenza A by PCR NEGATIVE NEGATIVE Final   Influenza B by PCR NEGATIVE NEGATIVE Final    Comment: (NOTE) The Xpert Xpress SARS-CoV-2/FLU/RSV plus assay is intended as an aid in the diagnosis  of influenza from Nasopharyngeal swab specimens and should not be used as a sole basis for treatment. Nasal washings and aspirates are unacceptable for Xpert Xpress SARS-CoV-2/FLU/RSV testing.  Fact Sheet for Patients: bloggercourse.com  Fact Sheet for Healthcare Providers: seriousbroker.it  This test is not yet approved or cleared by the [place]  FDA and has been authorized for detection and/or diagnosis of SARS-CoV-2 by FDA under an Emergency Use Authorization (EUA). This EUA will remain in effect (meaning this test can be used) for the duration of the COVID-19 declaration under Section 564(b)(1) of the Act, 21 U.S.C. section 360bbb-3(b)(1), unless the authorization is terminated or revoked.     Resp Syncytial Virus by PCR NEGATIVE NEGATIVE Final    Comment: (NOTE) Fact Sheet for Patients: bloggercourse.com  Fact Sheet for Healthcare Providers: seriousbroker.it  This test is not yet approved or cleared by the [place]  FDA and has been authorized for detection and/or diagnosis of SARS-CoV-2 by FDA under an Emergency Use Authorization (EUA). This EUA will remain in effect (meaning this test can be used) for the duration of the COVID-19 declaration under Section 564(b)(1) of the Act, 21 U.S.C. section 360bbb-3(b)(1), unless the authorization is terminated or revoked.  Performed at [facility], [address] [place], [place] [address]   Culture, blood (Routine X 2) w Reflex to ID Panel     Status: None (Preliminary result)   Collection Time: 12/03/23 12:57 PM   Specimen: BLOOD LEFT HAND  Result Value Ref Range Status   Specimen Description BLOOD LEFT HAND   Final   Special Requests   Final    BOTTLES DRAWN AEROBIC ONLY Blood Culture results may not be optimal due to an inadequate volume of blood received in culture bottles   Culture   Final    NO GROWTH 4 DAYS Performed at [facility], [address] [place], [place] [address]    Report Status PENDING  Incomplete  Culture, blood (Routine X 2) w Reflex to ID Panel     Status: None (Preliminary result)   Collection Time: 12/03/23  1:02 PM   Specimen: BLOOD LEFT ARM  Result Value Ref Range Status   Specimen Description BLOOD LEFT ARM  Final   Special Requests   Final    BOTTLES DRAWN AEROBIC AND ANAEROBIC Blood Culture results may not be optimal due to an inadequate volume of blood received in culture bottles   Culture   Final    NO GROWTH 4 DAYS Performed at [facility], [address], [place], [place] [address]    Report Status PENDING  Incomplete  CSF culture w Gram Stain     Status: None   Collection Time: 12/03/23  1:46 PM   Specimen: PATH Cytology CSF; Cerebrospinal Fluid  Result Value Ref Range Status   Specimen Description CSF  Final   Special Requests NONE  Final   Gram Stain NO WBC SEEN NO ORGANISMS SEEN   Final   Culture   Final    NO GROWTH 3 DAYS Performed at [facility], [address] [place], [place] [address]    Report Status 12/06/2023 FINAL  Final  Culture, Fungus without Smear     Status: None (Preliminary result)   Collection Time: 12/03/23  2:43 PM   Specimen: PATH Cytology CSF; Cerebrospinal Fluid  Result Value Ref Range Status   Specimen Description CSF  Final   Special Requests NONE  Final   Culture  Final    NO GROWTH 4 DAYS Performed at [facility], [address], [place], [place] [address]    Report Status PENDING  Incomplete  Anaerobic culture w Gram Stain     Status: None (Preliminary result)   Collection Time: 12/03/23  2:43 PM   Specimen: PATH Cytology CSF; Cerebrospinal Fluid  Result Value Ref Range Status    Specimen Description CSF  Final   Special Requests NONE  Final   Gram Stain   Final    NO WBC SEEN NO ORGANISMS SEEN Performed at [facility], [address], [place], [place] [address]    Culture   Final    NO ANAEROBES ISOLATED; CULTURE IN PROGRESS FOR 5 DAYS   Report Status PENDING  Incomplete    RADIOLOGY STUDIES/RESULTS: No results found.    LOS: 4 days   Donalda Applebaum, MD  [facility]    To contact the attending provider between 7A-7P or the covering provider during after hours 7P-7A, please log into the web site [email or web address] and access using universal [place] password for that web site. If you do not have the password, please call the hospital operator.  12/07/2023, 11:24 AM

## 2023-12-08 DIAGNOSIS — N179 Acute kidney failure, unspecified: Secondary | ICD-10-CM | POA: Diagnosis not present

## 2023-12-08 DIAGNOSIS — G9341 Metabolic encephalopathy: Secondary | ICD-10-CM | POA: Diagnosis not present

## 2023-12-08 DIAGNOSIS — U071 COVID-19: Secondary | ICD-10-CM | POA: Diagnosis not present

## 2023-12-08 LAB — ANAEROBIC CULTURE W GRAM STAIN: Gram Stain: NONE SEEN

## 2023-12-08 LAB — CBC WITH DIFFERENTIAL/PLATELET
Abs Immature Granulocytes: 0.03 10*3/uL (ref 0.00–0.07)
Basophils Absolute: 0 10*3/uL (ref 0.0–0.1)
Basophils Relative: 0 %
Eosinophils Absolute: 0.2 10*3/uL (ref 0.0–0.5)
Eosinophils Relative: 4 %
HCT: 35.8 % — ABNORMAL LOW (ref 36.0–46.0)
Hemoglobin: 11.5 g/dL — ABNORMAL LOW (ref 12.0–15.0)
Immature Granulocytes: 1 %
Lymphocytes Relative: 42 %
Lymphs Abs: 2.2 10*3/uL (ref 0.7–4.0)
MCH: 30.8 pg (ref 26.0–34.0)
MCHC: 32.1 g/dL (ref 30.0–36.0)
MCV: 96 fL (ref 80.0–100.0)
Monocytes Absolute: 0.7 10*3/uL (ref 0.1–1.0)
Monocytes Relative: 13 %
Neutro Abs: 2.1 10*3/uL (ref 1.7–7.7)
Neutrophils Relative %: 40 %
Platelets: 141 10*3/uL — ABNORMAL LOW (ref 150–400)
RBC: 3.73 MIL/uL — ABNORMAL LOW (ref 3.87–5.11)
RDW: 16.1 % — ABNORMAL HIGH (ref 11.5–15.5)
WBC: 5.3 10*3/uL (ref 4.0–10.5)
nRBC: 0 % (ref 0.0–0.2)

## 2023-12-08 LAB — BASIC METABOLIC PANEL
Anion gap: 8 (ref 5–15)
BUN: 26 mg/dL — ABNORMAL HIGH (ref 6–20)
CO2: 25 mmol/L (ref 22–32)
Calcium: 10.4 mg/dL — ABNORMAL HIGH (ref 8.9–10.3)
Chloride: 109 mmol/L (ref 98–111)
Creatinine, Ser: 1.16 mg/dL — ABNORMAL HIGH (ref 0.44–1.00)
GFR, Estimated: 54 mL/min — ABNORMAL LOW (ref >60)
Glucose, Bld: 98 mg/dL (ref 70–99)
Potassium: 4.5 mmol/L (ref 3.5–5.1)
Sodium: 142 mmol/L (ref 135–145)

## 2023-12-08 LAB — CULTURE, BLOOD (ROUTINE X 2)
Culture: NO GROWTH
Culture: NO GROWTH

## 2023-12-08 NOTE — Progress Notes (Signed)
 PROGRESS NOTE        PATIENT DETAILS Name: Paula Dickson Age: 58 y.o. Sex: female Date of Birth: [birth year] Admit Date: 12/02/2023 Admitting Physician Emeline CHRISTELLA Idol, DO ERE:Fnwjmry  Brief Summary: Patient is a 58 y.o.  female with history of bipolar disorder, hyperthyroidism-please continue with altered mental status-found to have COVID-19 infection.  Significant events: 1/5>> admit to [facility]  Significant studies: 1/5>> CXR: Left basilar airspace disease. 1/5>> MRI brain: No acute intracranial abnormality 1/5>> NH4:<10 1/5>> TSH: Within normal limit 1/6>> EEG: No seizures 1/6>> UDS: Negative 1/6>> CSF: WBC 1, protein 48 1/7>> echo: EF 55-60%  Significant microbiology data: 1/5>> COVID PCR: Positive 1/5>> influenza/RSV PCR: Negative 1/6>> blood culture: Negative 1/6>> CSF culture: Negative 1/6>> CSF biofire panel: Negative  Procedures: 1/6>> fluoroscopy-guided LP  Consults: Neurology Psychiatry  Subjective: Much more awake/alert-wants to drink a Sprite.  Objective: Vitals: Blood pressure (!) 143/83, pulse 78, temperature 97.8 F (36.6 C), temperature source Oral, resp. rate (!) 8, height 5' 1 (1.549 m), weight 59.7 kg, SpO2 98%.   Exam: Gen Exam:Alert awake-not in any distress HEENT:atraumatic, normocephalic Chest: B/L clear to auscultation anteriorly CVS:S1S2 regular Abdomen:soft non tender, non distended Extremities:no edema Neurology: Non focal Skin: no rash  Pertinent Labs/Radiology:    Latest Ref Rng & Units 12/08/2023    5:42 AM 12/07/2023    5:43 AM 12/06/2023    8:54 AM  CBC  WBC 4.0 - 10.5 K/uL 5.3  6.3  6.9   Hemoglobin 12.0 - 15.0 g/dL 88.4  88.1  87.3   Hematocrit 36.0 - 46.0 % 35.8  37.3  38.9   Platelets 150 - 400 K/uL 141  143  139     Lab Results  Component Value Date   NA 142 12/08/2023   K 4.5 12/08/2023   CL 109 12/08/2023   CO2 25 12/08/2023     Assessment/Plan: Acute metabolic  encephalopathy Etiology unclear but suspect multifactorial-from COVID-19 infection-mild elevation in lithium  levels-mild hypernatremia-AKI all playing a role. Neuro imaging/CSF analysis/Spot EEG all negative Significantly better-completely awake and alert and back to baseline Appreciate neurology input.  Tremors Significantly better-only minimal-has essentially resolved. Unclear significance  COVID-19 infection On room air-minimal infiltrate seen on x-ray on left base-possible aspiration rather than from COVID. Supportive care at this point  ?  Aspiration pneumonia Small infiltrate on left lung base-could have aspirated due to encephalopathy Rocephin  x 5 days total completed on 1/10-remains clinically stable Evaluated by SLP-placed on regular diet on 1/8-will follow closely.  Hypernatremia Resolved with IVF-encourage oral intake.  Hypercalcemia Mild PTH levels on the high normal side-which seems to be in disproportionate I suspect this is mostly from dehydration/lithium  use (but persisted in spite of IV fluids/stopping lithium ) but given that it is persistent-suspect will require outpatient endocrinology evaluation. To complete workup-will check vitamin D2/vitamin D3 levels with a.m. labs.  AKI on CKD 2 AKI likely hemodynamically mediated Slowly improving with supportive care Follow electrolytes closely.  PAF EKG on admit consistent with A-fib Has been sinus rhythm on telemetry since admit TSH stable With stable EF Possible that A-fib triggered by COVID-19 infection-appears brief-not sure if she is a good long-term candidate for anticoagulation- given psych issues-?  Compliance For now-monitor off anticoagulation  History of hyperthyroidism Does not appear to be on Tapazole  any longer Not sure if she is on propranolol -on home  med list TSH within normal limits Stable for outpatient monitoring  Bipolar disorder Initially both Depakote /lithium  were held-once patient  improved-psychiatry evaluation was requested-Depakote  was restarted-however on 1/10-psychiatry has discontinued Depakote  and started patient back on lithium .   She is currently stable-psychiatry recommending we recheck lithium  levels on 1/13.  Debility/deconditioning Spoke with Sister-[name]-1/7-mostly bedbound-able to take a few steps to go to the bathroom/kitchen-but basically stopped walking long distance last week of November.  Apparently she is afraid of falling.  For the past several days-mostly bedbound-with poor oral intake.  No sick contacts. PT/OT/SLP eval-SNF commended.  BMI: Estimated body mass index is 24.87 kg/m as calculated from the following:   Height as of this encounter: 5' 1 (1.549 m).   Weight as of this encounter: 59.7 kg.   Code status:   Code Status: Full Code   DVT Prophylaxis: heparin  injection 5,000 Units Start: 12/04/23 0000   Family Communication: Sister-[name]-[phone] updated over the phone 1/9   Disposition Plan: Status is: Inpatient Remains inpatient appropriate because: Severity of illness   Planned Discharge Destination: SNF   Diet: Diet Order             Diet regular Fluid consistency: Thin  Diet effective now                     Antimicrobial agents: Anti-infectives (From admission, onward)    Start     Dose/Rate Route Frequency Ordered Stop   12/07/23 2200  cefTRIAXone  (ROCEPHIN ) 2 g in sodium chloride  0.9 % 100 mL IVPB  Status:  Discontinued        2 g 200 mL/hr over 30 Minutes Intravenous Every 24 hours 12/07/23 0917 12/07/23 0919   12/07/23 2200  cefTRIAXone  (ROCEPHIN ) 2 g in sodium chloride  0.9 % 100 mL IVPB        2 g 200 mL/hr over 30 Minutes Intravenous  Once 12/07/23 0919 12/07/23 2322   12/04/23 2200  cefTRIAXone  (ROCEPHIN ) 2 g in sodium chloride  0.9 % 100 mL IVPB  Status:  Discontinued        2 g 200 mL/hr over 30 Minutes Intravenous Every 24 hours 12/04/23 0831 12/07/23 0917   12/04/23 1400  vancomycin   (VANCOREADY) IVPB 750 mg/150 mL  Status:  Discontinued        750 mg 150 mL/hr over 60 Minutes Intravenous Every 24 hours 12/03/23 1320 12/04/23 0453   12/04/23 1400  vancomycin  (VANCOCIN ) 750 mg in sodium chloride  0.9 % 250 mL IVPB  Status:  Discontinued        750 mg 265 mL/hr over 60 Minutes Intravenous Every 24 hours 12/04/23 0453 12/04/23 0831   12/03/23 1315  cefTRIAXone  (ROCEPHIN ) 2 g in sodium chloride  0.9 % 100 mL IVPB  Status:  Discontinued        2 g 200 mL/hr over 30 Minutes Intravenous Every 12 hours 12/03/23 1306 12/04/23 0831   12/03/23 1315  vancomycin  (VANCOREADY) IVPB 1250 mg/250 mL        1,250 mg 166.7 mL/hr over 90 Minutes Intravenous  Once 12/03/23 1307 12/04/23 1310   12/03/23 1315  acyclovir  (ZOVIRAX ) 595 mg in dextrose  5 % 100 mL IVPB  Status:  Discontinued        10 mg/kg  59.7 kg 111.9 mL/hr over 60 Minutes Intravenous Every 12 hours 12/03/23 1312 12/04/23 0831   12/03/23 1300  cefTRIAXone  (ROCEPHIN ) 2 g in sodium chloride  0.9 % 100 mL IVPB  Status:  Discontinued  2 g 200 mL/hr over 30 Minutes Intravenous Daily 12/03/23 1255 12/03/23 1306        MEDICATIONS: Scheduled Meds:  feeding supplement  237 mL Oral BID BM   heparin  injection (subcutaneous)  5,000 Units Subcutaneous Q8H   lithium  carbonate  300 mg Oral QPC breakfast   Continuous Infusions:   PRN Meds:.acetaminophen  **OR** acetaminophen , hydrALAZINE , metoprolol  tartrate, ondansetron  **OR** ondansetron  (ZOFRAN ) IV   I have personally reviewed following labs and imaging studies  LABORATORY DATA: CBC: Recent Labs  Lab 12/04/23 0510 12/05/23 0751 12/06/23 0854 12/07/23 0543 12/08/23 0542  WBC 6.7 6.5 6.9 6.3 5.3  NEUTROABS 3.9 3.3 3.2 2.7 2.1  HGB 10.7* 11.8* 12.6 11.8* 11.5*  HCT 33.2* 36.4 38.9 37.3 35.8*  MCV 95.7 97.1 96.0 97.9 96.0  PLT 129* 143* 139* 143* 141*    Basic Metabolic Panel: Recent Labs  Lab 12/04/23 0510 12/05/23 0751 12/06/23 0854 12/07/23 0543  12/08/23 0542  NA 150* 149* 141 142 142  K 4.3 4.2 4.4 4.7 4.5  CL 117* 116* 109 107 109  CO2 22 26 23 22 25   GLUCOSE 78 82 115* 102* 98  BUN 15 13 16 20  26*  CREATININE 1.43* 1.30* 1.26* 1.34* 1.16*  CALCIUM 10.1 10.6* 10.5* 10.5* 10.4*    GFR: Estimated Creatinine Clearance: 43.4 mL/min (A) (by C-G formula based on SCr of 1.16 mg/dL (H)).  Liver Function Tests: Recent Labs  Lab 12/02/23 1555 12/03/23 0450  AST 43* 30  ALT 41 29  ALKPHOS 204* 136*  BILITOT 0.7 0.7  PROT 8.5* 6.0*  ALBUMIN 4.2 3.0*   No results for input(s): LIPASE, AMYLASE in the last 168 hours. Recent Labs  Lab 12/02/23 2104 12/04/23 0510  AMMONIA <10 25    Coagulation Profile: No results for input(s): INR, PROTIME in the last 168 hours.  Cardiac Enzymes: Recent Labs  Lab 12/03/23 0450 12/05/23 0751  CKTOTAL 463* 45    BNP (last 3 results) No results for input(s): PROBNP in the last 8760 hours.  Lipid Profile: No results for input(s): CHOL, HDL, LDLCALC, TRIG, CHOLHDL, LDLDIRECT in the last 72 hours.  Thyroid  Function Tests: No results for input(s): TSH, T4TOTAL, FREET4, T3FREE, THYROIDAB in the last 72 hours.   Anemia Panel: No results for input(s): VITAMINB12, FOLATE, FERRITIN, TIBC, IRON, RETICCTPCT in the last 72 hours.  Urine analysis:    Component Value Date/Time   COLORURINE STRAW (A) 12/03/2023 0519   APPEARANCEUR CLEAR 12/03/2023 0519   LABSPEC 1.006 12/03/2023 0519   PHURINE 7.0 12/03/2023 0519   GLUCOSEU NEGATIVE 12/03/2023 0519   HGBUR NEGATIVE 12/03/2023 0519   BILIRUBINUR NEGATIVE 12/03/2023 0519   BILIRUBINUR neg 12/27/2015 1421   KETONESUR NEGATIVE 12/03/2023 0519   PROTEINUR NEGATIVE 12/03/2023 0519   UROBILINOGEN 0.2 12/27/2015 1421   UROBILINOGEN 0.2 06/14/2015 0040   NITRITE NEGATIVE 12/03/2023 0519   LEUKOCYTESUR NEGATIVE 12/03/2023 0519    Sepsis Labs: Lactic Acid, Venous    Component Value Date/Time    LATICACIDVEN 0.9 12/03/2023 1911    MICROBIOLOGY: Recent Results (from the past 240 hours)  Resp panel by RT-PCR (RSV, Flu A&B, Covid) Anterior Nasal Swab     Status: Abnormal   Collection Time: 12/02/23  7:50 PM   Specimen: Anterior Nasal Swab  Result Value Ref Range Status   SARS Coronavirus 2 by RT PCR POSITIVE (A) NEGATIVE Final   Influenza A by PCR NEGATIVE NEGATIVE Final   Influenza B by PCR NEGATIVE NEGATIVE Final    Comment: (  NOTE) The Xpert Xpress SARS-CoV-2/FLU/RSV plus assay is intended as an aid in the diagnosis of influenza from Nasopharyngeal swab specimens and should not be used as a sole basis for treatment. Nasal washings and aspirates are unacceptable for Xpert Xpress SARS-CoV-2/FLU/RSV testing.  Fact Sheet for Patients: bloggercourse.com  Fact Sheet for Healthcare Providers: seriousbroker.it  This test is not yet approved or cleared by the [place]  FDA and has been authorized for detection and/or diagnosis of SARS-CoV-2 by FDA under an Emergency Use Authorization (EUA). This EUA will remain in effect (meaning this test can be used) for the duration of the COVID-19 declaration under Section 564(b)(1) of the Act, 21 U.S.C. section 360bbb-3(b)(1), unless the authorization is terminated or revoked.     Resp Syncytial Virus by PCR NEGATIVE NEGATIVE Final    Comment: (NOTE) Fact Sheet for Patients: bloggercourse.com  Fact Sheet for Healthcare Providers: seriousbroker.it  This test is not yet approved or cleared by the [place]  FDA and has been authorized for detection and/or diagnosis of SARS-CoV-2 by FDA under an Emergency Use Authorization (EUA). This EUA will remain in effect (meaning this test can be used) for the duration of the COVID-19 declaration under Section 564(b)(1) of the Act, 21 U.S.C. section 360bbb-3(b)(1), unless the authorization is  terminated or revoked.  Performed at [facility], [address], [place], [place] [address]   Culture, blood (Routine X 2) w Reflex to ID Panel     Status: None   Collection Time: 12/03/23 12:57 PM   Specimen: BLOOD LEFT HAND  Result Value Ref Range Status   Specimen Description BLOOD LEFT HAND  Final   Special Requests   Final    BOTTLES DRAWN AEROBIC ONLY Blood Culture results may not be optimal due to an inadequate volume of blood received in culture bottles   Culture   Final    NO GROWTH 5 DAYS Performed at [facility], [address] [place], [place] [address]    Report Status 12/08/2023 FINAL  Final  Culture, blood (Routine X 2) w Reflex to ID Panel     Status: None   Collection Time: 12/03/23  1:02 PM   Specimen: BLOOD LEFT ARM  Result Value Ref Range Status   Specimen Description BLOOD LEFT ARM  Final   Special Requests   Final    BOTTLES DRAWN AEROBIC AND ANAEROBIC Blood Culture results may not be optimal due to an inadequate volume of blood received in culture bottles   Culture   Final    NO GROWTH 5 DAYS Performed at [facility], [address] [place], [place] [address]    Report Status 12/08/2023 FINAL  Final  CSF culture w Gram Stain     Status: None   Collection Time: 12/03/23  1:46 PM   Specimen: PATH Cytology CSF; Cerebrospinal Fluid  Result Value Ref Range Status   Specimen Description CSF  Final   Special Requests NONE  Final   Gram Stain NO WBC SEEN NO ORGANISMS SEEN   Final   Culture   Final    NO GROWTH 3 DAYS Performed at [facility], [address], [place], [place] [address]    Report Status 12/06/2023 FINAL  Final  Culture, Fungus without Smear     Status: None (Preliminary result)   Collection Time: 12/03/23  2:43 PM   Specimen: PATH Cytology CSF; Cerebrospinal Fluid  Result Value Ref Range Status   Specimen Description CSF  Final   Special Requests NONE  Final   Culture   Final    NO GROWTH 4 DAYS Performed  at [facility], [address], [place], [place] [address]    Report Status PENDING  Incomplete  Anaerobic culture w Gram Stain     Status: None (Preliminary result)   Collection Time: 12/03/23  2:43 PM   Specimen: PATH Cytology CSF; Cerebrospinal Fluid  Result Value Ref Range Status   Specimen Description CSF  Final   Special Requests NONE  Final   Gram Stain   Final    NO WBC SEEN NO ORGANISMS SEEN Performed at [facility], [address] [place], [place] [address]    Culture   Final    NO ANAEROBES ISOLATED; CULTURE IN PROGRESS FOR 5 DAYS   Report Status PENDING  Incomplete    RADIOLOGY STUDIES/RESULTS: No results found.    LOS: 5 days   Donalda Applebaum, MD  [facility]    To contact the attending provider between 7A-7P or the covering provider during after hours 7P-7A, please log into the web site [email or web address] and access using universal [place] password for that web site. If you do not have the password, please call the hospital operator.  12/08/2023, 10:40 AM

## 2023-12-08 NOTE — Plan of Care (Signed)

## 2023-12-08 NOTE — TOC Progression Note (Signed)
 Transition of Care [facility]) - Progression Note    Patient Details  Name: Paula Dickson MRN: [id number] Date of Birth: 12/18/1965  Transition of Care [facility]) CM/SW Contact  Cena Ligas, [place] Phone Number: 12/08/2023, 4:49 PM  Clinical Narrative:     CSW called and spoke to patients sister Winton on the phone. CSW gave bed offers, [name] requested those to be sent via text to [phone]. CSW sent text with requested information. [name] reports she will have answer tomorrow.   Expected Discharge Plan: Skilled Nursing Facility Barriers to Discharge: Continued Medical Work up, English As A Second Language Teacher, SNF Pending bed offer, SNF Covid  Expected Discharge Plan and Services In-house Referral: Clinical Social Work   Post Acute Care Choice: Skilled Nursing Facility Living arrangements for the past 2 months: Single Family Home                                       Social Determinants of Health (SDOH) Interventions SDOH Screenings   Alcohol  Screen: Low Risk  (11/23/2021)  Tobacco Use: Low Risk  (12/02/2023)    Readmission Risk Interventions     No data to display

## 2023-12-09 DIAGNOSIS — T56891D Toxic effect of other metals, accidental (unintentional), subsequent encounter: Secondary | ICD-10-CM

## 2023-12-09 DIAGNOSIS — G9341 Metabolic encephalopathy: Secondary | ICD-10-CM | POA: Diagnosis not present

## 2023-12-09 LAB — MAGNESIUM: Magnesium: 1.9 mg/dL (ref 1.7–2.4)

## 2023-12-09 LAB — COMPREHENSIVE METABOLIC PANEL
ALT: 37 U/L (ref 0–44)
AST: 34 U/L (ref 15–41)
Albumin: 2.9 g/dL — ABNORMAL LOW (ref 3.5–5.0)
Alkaline Phosphatase: 99 U/L (ref 38–126)
Anion gap: 8 (ref 5–15)
BUN: 24 mg/dL — ABNORMAL HIGH (ref 6–20)
CO2: 25 mmol/L (ref 22–32)
Calcium: 10.3 mg/dL (ref 8.9–10.3)
Chloride: 105 mmol/L (ref 98–111)
Creatinine, Ser: 1.29 mg/dL — ABNORMAL HIGH (ref 0.44–1.00)
GFR, Estimated: 48 mL/min — ABNORMAL LOW (ref >60)
Glucose, Bld: 139 mg/dL — ABNORMAL HIGH (ref 70–99)
Potassium: 4.2 mmol/L (ref 3.5–5.1)
Sodium: 138 mmol/L (ref 135–145)
Total Bilirubin: 0.4 mg/dL (ref 0.0–1.2)
Total Protein: 6 g/dL — ABNORMAL LOW (ref 6.5–8.1)

## 2023-12-09 LAB — CBC WITH DIFFERENTIAL/PLATELET
Abs Immature Granulocytes: 0.03 10*3/uL (ref 0.00–0.07)
Basophils Absolute: 0 10*3/uL (ref 0.0–0.1)
Basophils Relative: 0 %
Eosinophils Absolute: 0.3 10*3/uL (ref 0.0–0.5)
Eosinophils Relative: 5 %
HCT: 37.4 % (ref 36.0–46.0)
Hemoglobin: 12 g/dL (ref 12.0–15.0)
Immature Granulocytes: 1 %
Lymphocytes Relative: 35 %
Lymphs Abs: 2.3 10*3/uL (ref 0.7–4.0)
MCH: 31.1 pg (ref 26.0–34.0)
MCHC: 32.1 g/dL (ref 30.0–36.0)
MCV: 96.9 fL (ref 80.0–100.0)
Monocytes Absolute: 0.8 10*3/uL (ref 0.1–1.0)
Monocytes Relative: 12 %
Neutro Abs: 3.1 10*3/uL (ref 1.7–7.7)
Neutrophils Relative %: 47 %
Platelets: 166 10*3/uL (ref 150–400)
RBC: 3.86 MIL/uL — ABNORMAL LOW (ref 3.87–5.11)
RDW: 15.7 % — ABNORMAL HIGH (ref 11.5–15.5)
WBC: 6.6 10*3/uL (ref 4.0–10.5)
nRBC: 0 % (ref 0.0–0.2)

## 2023-12-09 LAB — LITHIUM LEVEL: Lithium Lvl: 0.6 mmol/L (ref 0.60–1.20)

## 2023-12-09 LAB — VITAMIN D 25 HYDROXY (VIT D DEFICIENCY, FRACTURES): Vit D, 25-Hydroxy: 27.01 ng/mL — ABNORMAL LOW (ref 30–100)

## 2023-12-09 LAB — BRAIN NATRIURETIC PEPTIDE: B Natriuretic Peptide: 7.3 pg/mL (ref 0.0–100.0)

## 2023-12-09 LAB — PHOSPHORUS: Phosphorus: 3.7 mg/dL (ref 2.5–4.6)

## 2023-12-09 MED ORDER — CALCITONIN (SALMON) 200 UNIT/ACT NA SOLN
1.0000 | Freq: Every day | NASAL | Status: DC
  Administered 2023-12-10: 1 via NASAL
  Filled 2023-12-09: qty 3.7

## 2023-12-09 MED ORDER — ORAL CARE MOUTH RINSE
15.0000 mL | OROMUCOSAL | Status: DC
  Administered 2023-12-09 – 2023-12-14 (×18): 15 mL via OROMUCOSAL

## 2023-12-09 MED ORDER — LACTATED RINGERS IV SOLN
INTRAVENOUS | Status: DC
Start: 2023-12-09 — End: 2023-12-10

## 2023-12-09 MED ORDER — ORAL CARE MOUTH RINSE: 15.0000 mL | OROMUCOSAL | Status: DC | PRN

## 2023-12-09 NOTE — Progress Notes (Signed)
 PROGRESS NOTE        PATIENT DETAILS Name: Paula Dickson Age: 58 y.o. Sex: female Date of Birth: 1966-02-20 Admit Date: 12/02/2023 Admitting Physician Emeline CHRISTELLA Idol, DO ERE:Fnwjmry  Brief Summary: Patient is a 58 y.o.  female with history of bipolar disorder, hyperthyroidism-please continue with altered mental status-found to have COVID-19 infection.  Significant events: 1/5>> admit to [facility]  Significant studies: 1/5>> CXR: Left basilar airspace disease. 1/5>> MRI brain: No acute intracranial abnormality 1/5>> NH4:<10 1/5>> TSH: Within normal limit 1/6>> EEG: No seizures 1/6>> UDS: Negative 1/6>> CSF: WBC 1, protein 48 1/7>> echo: EF 55-60%  Significant microbiology data: 1/5>> COVID PCR: Positive 1/5>> influenza/RSV PCR: Negative 1/6>> blood culture: Negative 1/6>> CSF culture: Negative 1/6>> CSF biofire panel: Negative  Procedures: 1/6>> fluoroscopy-guided LP  Consults: Neurology Psychiatry  Subjective: Patient in bed, appears comfortable, denies any headache, no fever, no chest pain or pressure, no shortness of breath , no abdominal pain. No focal weakness.  Objective: Vitals: Blood pressure 132/72, pulse 75, temperature 98.7 F (37.1 C), temperature source Oral, resp. rate 10, height 5' 1 (1.549 m), weight 59.7 kg, SpO2 97%.   Exam: G Awake Alert, No new F.N deficits, Normal affect [place].AT,PERRAL Supple Neck, No JVD,   Symmetrical Chest wall movement, Good air movement bilaterally, CTAB RRR,No Gallops, Rubs or new Murmurs,  +ve B.Sounds, Abd Soft, No tenderness,   No Cyanosis, Clubbing or edema   Assessment/Plan: Acute metabolic encephalopathy Etiology unclear but suspect multifactorial-from COVID-19 infection-mild elevation in lithium  levels-mild hypernatremia-AKI all playing a role. Neuro imaging/CSF analysis/Spot EEG all negative Significantly better-completely awake and alert and back to baseline Appreciate  neurology input.  Tremors Significantly better-only minimal-has essentially resolved. Unclear significance  COVID-19 infection On room air-minimal infiltrate seen on x-ray on left base-possible aspiration rather than from COVID. Supportive care at this point  ?  Aspiration pneumonia Small infiltrate on left lung base-could have aspirated due to encephalopathy Rocephin  x 5 days total completed on 1/10-remains clinically stable Evaluated by SLP-placed on regular diet on 1/8-will follow closely.  Hypernatremia Resolved with IVF-encourage oral intake.  Hypercalcemia Mild, PTH levels mildly elevated in the clinical scenario, 1,25 vitamin D  levels are low, calcitriol  levels are pending, hypercalcemia could be due to dehydration and reduced by high levels of lithium , corrected calcium level still elevated, continue to hydrate, calcitonin initiated on 12/09/2023.  Will need outpatient endocrine follow-up and further workup for hypercalcemia upon discharge.  AKI on CKD 2 AKI likely hemodynamically mediated Slowly improving with supportive care Follow electrolytes closely.  PAF EKG on admit consistent with A-fib Has been sinus rhythm on telemetry since admit TSH stable With stable EF Possible that A-fib triggered by COVID-19 infection-appears brief-not sure if she is a good long-term candidate for anticoagulation- given psych issues-?  Compliance For now-monitor off anticoagulation  History of hyperthyroidism Does not appear to be on Tapazole  any longer Not sure if she is on propranolol -on home med list TSH within normal limits Stable for outpatient monitoring  Bipolar disorder Initially both Depakote /lithium  were held-once patient improved-psychiatry evaluation was requested-Depakote  was restarted-however on 1/10-psychiatry has discontinued Depakote  and started patient back on lithium .   She is currently stable-psychiatry recommending we recheck lithium  levels on  1/13.  Debility/deconditioning Spoke with Sister-[name]-1/7-mostly bedbound-able to take a few steps to go to the bathroom/kitchen-but basically stopped walking long distance  last week of November.  Apparently she is afraid of falling.  For the past several days-mostly bedbound-with poor oral intake.  No sick contacts. PT/OT/SLP eval-SNF commended.  BMI: Estimated body mass index is 24.87 kg/m as calculated from the following:   Height as of this encounter: 5' 1 (1.549 m).   Weight as of this encounter: 59.7 kg.   Code status:   Code Status: Full Code   DVT Prophylaxis: heparin  injection 5,000 Units Start: 12/04/23 0000   Family Communication: Sister-[name]-[phone] updated over the phone 1/9   Disposition Plan: Status is: Inpatient Remains inpatient appropriate because: Severity of illness   Planned Discharge Destination: SNF   Diet: Diet Order             Diet regular Fluid consistency: Thin  Diet effective now                    MEDICATIONS: Scheduled Meds:  calcitonin (salmon)  1 spray Alternating Nares Daily   feeding supplement  237 mL Oral BID BM   heparin  injection (subcutaneous)  5,000 Units Subcutaneous Q8H   lithium  carbonate  300 mg Oral QPC breakfast   Continuous Infusions:  lactated ringers       PRN Meds:.acetaminophen  **OR** acetaminophen , hydrALAZINE , metoprolol  tartrate, ondansetron  **OR** ondansetron  (ZOFRAN ) IV   I have personally reviewed following labs and imaging studies  LABORATORY DATA: Recent Labs  Lab 12/05/23 0751 12/06/23 0854 12/07/23 0543 12/08/23 0542 12/09/23 0550  WBC 6.5 6.9 6.3 5.3 6.6  HGB 11.8* 12.6 11.8* 11.5* 12.0  HCT 36.4 38.9 37.3 35.8* 37.4  PLT 143* 139* 143* 141* 166  MCV 97.1 96.0 97.9 96.0 96.9  MCH 31.5 31.1 31.0 30.8 31.1  MCHC 32.4 32.4 31.6 32.1 32.1  RDW 16.0* 15.8* 16.1* 16.1* 15.7*  LYMPHSABS 2.1 2.6 2.7 2.2 2.3  MONOABS 0.9 0.9 0.6 0.7 0.8  EOSABS 0.1 0.2 0.3 0.2 0.3  BASOSABS 0.0 0.0  0.0 0.0 0.0    Recent Labs  Lab  0000 12/02/23 1511 12/02/23 1555 12/02/23 2104 12/03/23 0251 12/03/23 0450 12/03/23 1602 12/03/23 1911 12/04/23 0510 12/05/23 0751 12/06/23 0854 12/07/23 0543 12/08/23 0542 12/09/23 0550  NA  --   --  141  --    < > 146*  --   --  150* 149* 141 142 142 138  K  --   --  4.1  --    < > 4.5  --   --  4.3 4.2 4.4 4.7 4.5 4.2  CL   < >  --  100  --   --  113*  --   --  117* 116* 109 107 109 105  CO2   < >  --  25  --   --  24  --   --  22 26 23 22 25 25   ANIONGAP   < >  --  16*  --   --  9  --   --  11 7 9 13 8 8   GLUCOSE   < >  --  109*  --   --  71  --   --  78 82 115* 102* 98 139*  BUN   < >  --  25*  --   --  18  --   --  15 13 16 20  26* 24*  CREATININE   < >  --  1.56*  --   --  1.30*  --   --  1.43* 1.30*  1.26* 1.34* 1.16* 1.29*  AST  --   --  43*  --   --  30  --   --   --   --   --   --   --  34  ALT  --   --  41  --   --  29  --   --   --   --   --   --   --  37  ALKPHOS  --   --  204*  --   --  136*  --   --   --   --   --   --   --  99  BILITOT  --   --  0.7  --   --  0.7  --   --   --   --   --   --   --  0.4  ALBUMIN  --   --  4.2  --   --  3.0*  --   --   --   --   --   --   --  2.9*  CRP  --   --   --   --   --   --   --   --  3.3*  --   --   --   --   --   LATICACIDVEN  --   --   --   --   --   --  1.2 0.9  --   --   --   --   --   --   TSH  --  3.325  --   --   --   --   --   --   --   --   --   --   --   --   AMMONIA  --   --   --  <10  --   --   --   --  25  --   --   --   --   --   BNP  --   --   --   --   --   --   --   --   --   --   --   --   --  7.3  MG  --   --   --   --   --   --   --   --   --   --   --   --   --  1.9  CALCIUM   < >  --  11.6*  --   --  10.0  --   --  10.1 10.6* 10.5* 10.5* 10.4* 10.3   < > = values in this interval not displayed.     Recent Labs  Lab 12/02/23 1511 12/02/23 1555 12/02/23 2104 12/03/23 0450 12/03/23 1602 12/03/23 1911 12/04/23 0510 12/05/23 0751 12/06/23 0854 12/07/23 0543  12/08/23 0542 12/09/23 0550  CRP  --   --   --   --   --   --  3.3*  --   --   --   --   --   LATICACIDVEN  --   --   --   --  1.2 0.9  --   --   --   --   --   --   TSH 3.325  --   --   --   --   --   --   --   --   --   --   --  AMMONIA  --   --  <10  --   --   --  25  --   --   --   --   --   BNP  --   --   --   --   --   --   --   --   --   --   --  7.3  MG  --   --   --   --   --   --   --   --   --   --   --  1.9  CALCIUM  --    < >  --    < >  --   --  10.1 10.6* 10.5* 10.5* 10.4* 10.3   < > = values in this interval not displayed.      RADIOLOGY STUDIES/RESULTS: No results found.    LOS: 6 days  Signature  -    Lavada Stank M.D on 12/09/2023 at 9:57 AM   -  To page go to [email or web address]

## 2023-12-09 NOTE — TOC Progression Note (Signed)
 Transition of Care [name]) - Progression Note    Patient Details  Name: Paula Dickson MRN: [id number] Date of Birth: Aug 18, 1966  Transition of Care [facility]) CM/SW Contact  Cena Ligas, [place] Phone Number: 12/09/2023, 10:30 AM  Clinical Narrative:     CSW received text message from patients sister Winton, stating that she would like for member to go to [facility] for SNF. CSW reached out to [facility] to update them. [facility] requires that covid positive patients be 10 days out from their 1st positive, which was on 12/02/23.   Expected Discharge Plan: Skilled Nursing Facility Barriers to Discharge: Continued Medical Work up, English As A Second Language Teacher, SNF Pending bed offer, SNF Covid  Expected Discharge Plan and Services In-house Referral: Clinical Social Work   Post Acute Care Choice: Skilled Nursing Facility Living arrangements for the past 2 months: Single Family Home                                       Social Determinants of Health (SDOH) Interventions SDOH Screenings   Alcohol  Screen: Low Risk  (11/23/2021)  Tobacco Use: Low Risk  (12/02/2023)    Readmission Risk Interventions     No data to display

## 2023-12-09 NOTE — Consult Note (Signed)
 [facility] Face-to-Face Psychiatry Consult   Reason for Consult:  Significant psych history on lithium /Depakote /Zyprexa -came in with confusion-lithium  levels are mildly elevated-had hypercalcemia/hypernatremia-she has improved with supportive care-discussed with neurology-wondering if you all could see her and see if we should resume her lithium  and other medications.  Not sure if the electrolyte changes-mental status was from lithium  toxicity-although levels only minimally elevated.  Referring Physician:  Dr. Raenelle  Patient Identification: Paula Dickson MRN:  [id number] Principal Diagnosis: Acute metabolic encephalopathy Diagnosis:  Principal Problem:   Acute metabolic encephalopathy Active Problems:   Hypercalcemia   Hyperthyroidism   Schizoaffective disorder, bipolar type (HCC)   AKI (acute kidney injury) (HCC)   Paroxysmal atrial fibrillation (HCC)   COVID-19 virus infection   Total Time spent with patient: 30 minutes    Subjective:   Paula Dickson is a 58 y.o. female patient with a history of schizoaffective disorder-bipolar type who presents to [facility] due to confucian and lethargy behavior. Psych consult placed for  psych history on Lithium /Zyprexa  and Depakote . Came in with confusion. Lithium  levels are mildly elevated.   1/12: Spoke with nurse who states she had to feed her as patient unable to feed herself due to tremor. I spoke with patient's sister who states that she has had new onset tremor the past 3 months which she did not have before; is not aware of any lithium  levels during this time but notes patient became more confused and less responsive 2 weeks ago and bedbound. I discussed r/b/a of lithium  and due to continued concerns for lithium  toxicity plan to stop it and switch to Depakote  or Trileptal . I ordered a stat level.  Patient seen at bedside and with severe hand-flapping tremor, completed finger to nose neuro exam and patient is ataxic. She is alert but does  not remember who brought her to the hospital or why she is here. Patient is oriented to self and location, month but not date or year stating it is 2024. States she has had difficulty walking and had falls at one point but does not remember when. Patient denies wanting to be dead or feeling hopeless. She denies any suicidal or homicidal ideation, as well as any auditory or visual hallucinations.   Past Psychiatric History:  Schizoaffective disorder, Bipolar type, Generalized anxiety disorder.  Multiple inpatient hospitalizations. Inpatient at [facility] 08/23, [facility] 02/2022, [facility] 03/2022, [facility] 08/2022, 12/202, 09/2020.   Past Medical History:  Past Medical History:  Diagnosis Date   Bipolar affective disorder (HCC)    Bipolar disorder (HCC)    History of arthritis    History of chicken pox    History of depression    History of genital warts    history of heart murmur    History of high blood pressure    History of thyroid  disease    History of UTI    Hypertension    Low TSH level 07/13/2017   Schizophrenia (HCC)     Past Surgical History:  Procedure Laterality Date   ABLATION ON ENDOMETRIOSIS     CYST REMOVAL NECK     around 11 years ago /benign   MULTIPLE TOOTH EXTRACTIONS     Family History:  Family History  Problem Relation Age of Onset   Arthritis Father    Hyperlipidemia Father    High blood pressure Father    Diabetes Sister    Diabetes Mother    Diabetes Brother    Mental illness Brother    Alcohol   abuse Paternal Uncle    Alcohol  abuse Paternal Grandfather    Breast cancer Maternal Aunt    Breast cancer Paternal Aunt    High blood pressure Sister    Mental illness Other        runs in family   Family Psychiatric  History:  Social History:  Social History   Substance and Sexual Activity  Alcohol  Use Not Currently     Social History   Substance and Sexual Activity  Drug Use Not Currently    Social History   Socioeconomic  History   Marital status: Single    Spouse name: Not on file   Number of children: Not on file   Years of education: Not on file   Highest education level: Patient refused  Occupational History   Not on file  Tobacco Use   Smoking status: Never   Smokeless tobacco: Never  Vaping Use   Vaping status: Never Used  Substance and Sexual Activity   Alcohol  use: Not Currently   Drug use: Not Currently   Sexual activity: Not Currently  Other Topics Concern   Not on file  Social History Narrative   ** Merged History Encounter **       Social Drivers of Health   Financial Resource Strain: Not on file  Food Insecurity: Not on file  Transportation Needs: Not on file  Physical Activity: Not on file  Stress: Not on file  Social Connections: Not on file   Additional Social History:    Allergies:  No Known Allergies  Labs:  Results for orders placed or performed during the hospital encounter of 12/02/23 (from the past 48 hours)  Basic metabolic panel     Status: Abnormal   Collection Time: 12/08/23  5:42 AM  Result Value Ref Range   Sodium 142 135 - 145 mmol/L   Potassium 4.5 3.5 - 5.1 mmol/L   Chloride 109 98 - 111 mmol/L   CO2 25 22 - 32 mmol/L   Glucose, Bld 98 70 - 99 mg/dL    Comment: Glucose reference range applies only to samples taken after fasting for at least 8 hours.   BUN 26 (H) 6 - 20 mg/dL   Creatinine, Ser 8.83 (H) 0.44 - 1.00 mg/dL   Calcium 89.5 (H) 8.9 - 10.3 mg/dL   GFR, Estimated 54 (L) >60 mL/min    Comment: (NOTE) Calculated using the CKD-EPI Creatinine Equation (2021)    Anion gap 8 5 - 15    Comment: Performed at [facility], [address], [place], [place] [address]  CBC with Differential/Platelet     Status: Abnormal   Collection Time: 12/08/23  5:42 AM  Result Value Ref Range   WBC 5.3 4.0 - 10.5 K/uL   RBC 3.73 (L) 3.87 - 5.11 MIL/uL   Hemoglobin 11.5 (L) 12.0 - 15.0 g/dL   HCT 64.1 (L) 63.9 - 53.9 %   MCV 96.0 80.0 - 100.0 fL   MCH  30.8 26.0 - 34.0 pg   MCHC 32.1 30.0 - 36.0 g/dL   RDW 83.8 (H) 88.4 - 84.4 %   Platelets 141 (L) 150 - 400 K/uL   nRBC 0.0 0.0 - 0.2 %   Neutrophils Relative % 40 %   Neutro Abs 2.1 1.7 - 7.7 K/uL   Lymphocytes Relative 42 %   Lymphs Abs 2.2 0.7 - 4.0 K/uL   Monocytes Relative 13 %   Monocytes Absolute 0.7 0.1 - 1.0 K/uL  Eosinophils Relative 4 %   Eosinophils Absolute 0.2 0.0 - 0.5 K/uL   Basophils Relative 0 %   Basophils Absolute 0.0 0.0 - 0.1 K/uL   Immature Granulocytes 1 %   Abs Immature Granulocytes 0.03 0.00 - 0.07 K/uL    Comment: Performed at [facility], [address] [place], [place] [address]  VITAMIN D  25 Hydroxy (Vit-D Deficiency, Fractures)     Status: Abnormal   Collection Time: 12/09/23  5:50 AM  Result Value Ref Range   Vit D, 25-Hydroxy 27.01 (L) 30 - 100 ng/mL    Comment: (NOTE) Vitamin D  deficiency has been defined by the Institute of Medicine  and an Endocrine Society practice guideline as a level of serum 25-OH  vitamin D  less than 20 ng/mL (1,2). The Endocrine Society went on to  further define vitamin D  insufficiency as a level between 21 and 29  ng/mL (2).  1. IOM (Institute of Medicine). 2010. Dietary reference intakes for  calcium and D. [place]: [employer]. 2. Holick MF, Binkley [place], Bischoff-Ferrari HA, et al. Evaluation,  treatment, and prevention of vitamin D  deficiency: an Endocrine  Society clinical practice guideline, JCEM. 2011 Jul; 96(7): 1911-30.  Performed at [facility], [address] [place], [place] [address]   Comprehensive metabolic panel     Status: Abnormal   Collection Time: 12/09/23  5:50 AM  Result Value Ref Range   Sodium 138 135 - 145 mmol/L   Potassium 4.2 3.5 - 5.1 mmol/L   Chloride 105 98 - 111 mmol/L   CO2 25 22 - 32 mmol/L   Glucose, Bld 139 (H) 70 - 99 mg/dL    Comment: Glucose reference range applies only to samples taken after fasting for at least 8 hours.   BUN 24 (H)  6 - 20 mg/dL   Creatinine, Ser 8.70 (H) 0.44 - 1.00 mg/dL   Calcium 89.6 8.9 - 89.6 mg/dL   Total Protein 6.0 (L) 6.5 - 8.1 g/dL   Albumin 2.9 (L) 3.5 - 5.0 g/dL   AST 34 15 - 41 U/L   ALT 37 0 - 44 U/L   Alkaline Phosphatase 99 38 - 126 U/L   Total Bilirubin 0.4 0.0 - 1.2 mg/dL   GFR, Estimated 48 (L) >60 mL/min    Comment: (NOTE) Calculated using the CKD-EPI Creatinine Equation (2021)    Anion gap 8 5 - 15    Comment: Performed at [facility], [address], [place], [place] [address]  CBC with Differential/Platelet     Status: Abnormal   Collection Time: 12/09/23  5:50 AM  Result Value Ref Range   WBC 6.6 4.0 - 10.5 K/uL   RBC 3.86 (L) 3.87 - 5.11 MIL/uL   Hemoglobin 12.0 12.0 - 15.0 g/dL   HCT 62.5 63.9 - 53.9 %   MCV 96.9 80.0 - 100.0 fL   MCH 31.1 26.0 - 34.0 pg   MCHC 32.1 30.0 - 36.0 g/dL   RDW 84.2 (H) 88.4 - 84.4 %   Platelets 166 150 - 400 K/uL   nRBC 0.0 0.0 - 0.2 %   Neutrophils Relative % 47 %   Neutro Abs 3.1 1.7 - 7.7 K/uL   Lymphocytes Relative 35 %   Lymphs Abs 2.3 0.7 - 4.0 K/uL   Monocytes Relative 12 %   Monocytes Absolute 0.8 0.1 - 1.0 K/uL   Eosinophils Relative 5 %   Eosinophils Absolute 0.3 0.0 - 0.5  K/uL   Basophils Relative 0 %   Basophils Absolute 0.0 0.0 - 0.1 K/uL   Immature Granulocytes 1 %   Abs Immature Granulocytes 0.03 0.00 - 0.07 K/uL    Comment: Performed at [facility], [address] [place], [place] [address]  Brain natriuretic peptide     Status: None   Collection Time: 12/09/23  5:50 AM  Result Value Ref Range   B Natriuretic Peptide 7.3 0.0 - 100.0 pg/mL    Comment: Performed at [facility], [address] [place], [place] [address]  Magnesium      Status: None   Collection Time: 12/09/23  5:50 AM  Result Value Ref Range   Magnesium  1.9 1.7 - 2.4 mg/dL    Comment: Performed at [facility] Lab, [address], [place], [place] [address]  Phosphorus     Status: None   Collection Time: 12/09/23  5:50  AM  Result Value Ref Range   Phosphorus 3.7 2.5 - 4.6 mg/dL    Comment: Performed at [facility], [address], [place], [place] [address]    Current Facility-Administered Medications  Medication Dose Route Frequency Provider Last Rate Last Admin   acetaminophen  (TYLENOL ) tablet 650 mg  650 mg Oral Q6H PRN Lonzell Emeline HERO, DO       Or   acetaminophen  (TYLENOL ) suppository 650 mg  650 mg Rectal Q6H PRN Lonzell Emeline HERO, DO       calcitonin (salmon) (MIACALCIN MARCIANA) nasal spray 1 spray  1 spray Alternating Nares Daily Singh, Prashant K, MD       feeding supplement (ENSURE ENLIVE / ENSURE PLUS) liquid 237 mL  237 mL Oral BID BM Hall, Carole N, DO   237 mL at 12/09/23 1022   heparin  injection 5,000 Units  5,000 Units Subcutaneous Q8H Samtani, Jai-Gurmukh, MD   5,000 Units at 12/09/23 9480   hydrALAZINE  (APRESOLINE ) injection 10 mg  10 mg Intravenous Q6H PRN Raenelle Donalda HERO, MD   10 mg at 12/06/23 2004   lactated ringers  infusion   Intravenous Continuous Singh, Prashant K, MD 75 mL/hr at 12/09/23 1023 New Bag at 12/09/23 1023   metoprolol  tartrate (LOPRESSOR ) injection 5 mg  5 mg Intravenous Q6H PRN Gardner, Jared M, DO       ondansetron  (ZOFRAN ) tablet 4 mg  4 mg Oral Q6H PRN Lonzell Emeline HERO, DO       Or   ondansetron  (ZOFRAN ) injection 4 mg  4 mg Intravenous Q6H PRN Lonzell Emeline HERO, DO        Musculoskeletal: Strength & Muscle Tone: within normal limits Gait & Station: normal Patient leans: N/A  Psychiatric Specialty Exam:  Presentation  General Appearance: Appropriate for Environment; Casual   Eye Contact:Good   Speech:Clear and Coherent; Normal Rate   Speech Volume: normal  Handedness:Right    Mood and Affect  Mood:Euthymic   Affect:Congruent; Appropriate    Thought Process  Thought Processes:Coherent; Linear   Descriptions of Associations:Intact   Orientation:Full (Time, Place and Person)   Thought Content:Logical   History of  Schizophrenia/Schizoaffective disorder: yes  Duration of Psychotic Symptoms: > 6 months  Hallucinations:No data recorded   Ideas of Reference: none  Suicidal Thoughts:No data recorded   Homicidal Thoughts:No data recorded    Sensorium  Memory:Immediate Fair; Recent Fair; Remote Fair   Judgment:Fair   Insight:Fair    Executive Functions  Concentration:Fair   Attention Span:Poor   Recall:Poor   Fund of Knowledge:Fair  Language:Fair    Psychomotor Activity  Psychomotor Activity:No data recorded    Assets  Assets:Financial Resources/Insurance; Housing   Sleep  Sleep:No data recorded   Physical Exam: Physical Exam Vitals and nursing note reviewed.  Constitutional:      Appearance: Normal appearance. She is normal weight.  Eyes:     Pupils: Pupils are equal, round, and reactive to light.  Skin:    General: Skin is warm.     Capillary Refill: Capillary refill takes less than 2 seconds.  Neurological:     General: No focal deficit present.     Mental Status: She is alert and oriented to person, place, and time. Mental status is at baseline.  Psychiatric:        Mood and Affect: Mood normal.        Behavior: Behavior normal.        Thought Content: Thought content normal.        Judgment: Judgment normal.    Review of Systems  Psychiatric/Behavioral:  The patient is nervous/anxious.   All other systems reviewed and are negative.  Blood pressure (!) 146/88, pulse 73, temperature 98.7 F (37.1 C), temperature source Oral, resp. rate 14, height 5' 1 (1.549 m), weight 59.7 kg, SpO2 96%. Body mass index is 24.87 kg/m.  Treatment Plan Summary: Daily contact with patient to assess and evaluate symptoms and progress in treatment, Medication management, and Plan   -patient with symptoms of lithium  toxicity on my exam, ordered stat lithium  level repeat - given patient's persistently elevated creatinine and current ataxia, fecal incontinency,  mental confusion and severe handflapping tremor, I held her lithium  dose  -once patient's neurological symptoms of ataxia improve I would restart Depakote  500 mg at bedtime as previously planned in place of lithium   -TOC referral for SNF to help with reconditioning and strength training.    Labs reviewed: Including abnormal sodium of 138,  elevated creatinine 1.29,  calcium 10.3, elevated lithium  level of 1.28--> .58, ordered repeat Level VPA level < 10 LFT's wnl  Disposition: No evidence of imminent risk to self or others at present.   Patient does not meet criteria for psychiatric inpatient admission. Supportive therapy provided about ongoing stressors. Discussed crisis plan, support from social network, calling 911, coming to the Emergency Department, and calling Suicide Hotline.  Nakaila Freeze, MD 12/09/2023 2:00 PM

## 2023-12-10 DIAGNOSIS — G9341 Metabolic encephalopathy: Secondary | ICD-10-CM | POA: Diagnosis not present

## 2023-12-10 LAB — COMPREHENSIVE METABOLIC PANEL
ALT: 32 U/L (ref 0–44)
AST: 22 U/L (ref 15–41)
Albumin: 2.9 g/dL — ABNORMAL LOW (ref 3.5–5.0)
Alkaline Phosphatase: 95 U/L (ref 38–126)
Anion gap: 14 (ref 5–15)
BUN: 24 mg/dL — ABNORMAL HIGH (ref 6–20)
CO2: 21 mmol/L — ABNORMAL LOW (ref 22–32)
Calcium: 10.2 mg/dL (ref 8.9–10.3)
Chloride: 104 mmol/L (ref 98–111)
Creatinine, Ser: 1.22 mg/dL — ABNORMAL HIGH (ref 0.44–1.00)
GFR, Estimated: 51 mL/min — ABNORMAL LOW (ref >60)
Glucose, Bld: 93 mg/dL (ref 70–99)
Potassium: 5 mmol/L (ref 3.5–5.1)
Sodium: 139 mmol/L (ref 135–145)
Total Bilirubin: 0.7 mg/dL (ref 0.0–1.2)
Total Protein: 5.7 g/dL — ABNORMAL LOW (ref 6.5–8.1)

## 2023-12-10 LAB — CBC WITH DIFFERENTIAL/PLATELET
Abs Immature Granulocytes: 0.03 10*3/uL (ref 0.00–0.07)
Basophils Absolute: 0 10*3/uL (ref 0.0–0.1)
Basophils Relative: 0 %
Eosinophils Absolute: 0.3 10*3/uL (ref 0.0–0.5)
Eosinophils Relative: 3 %
HCT: 37.7 % (ref 36.0–46.0)
Hemoglobin: 12.2 g/dL (ref 12.0–15.0)
Immature Granulocytes: 0 %
Lymphocytes Relative: 27 %
Lymphs Abs: 2 10*3/uL (ref 0.7–4.0)
MCH: 31.1 pg (ref 26.0–34.0)
MCHC: 32.4 g/dL (ref 30.0–36.0)
MCV: 96.2 fL (ref 80.0–100.0)
Monocytes Absolute: 0.7 10*3/uL (ref 0.1–1.0)
Monocytes Relative: 10 %
Neutro Abs: 4.3 10*3/uL (ref 1.7–7.7)
Neutrophils Relative %: 60 %
Platelets: 221 10*3/uL (ref 150–400)
RBC: 3.92 MIL/uL (ref 3.87–5.11)
RDW: 15.5 % (ref 11.5–15.5)
WBC: 7.3 10*3/uL (ref 4.0–10.5)
nRBC: 0 % (ref 0.0–0.2)

## 2023-12-10 LAB — MAGNESIUM: Magnesium: 1.9 mg/dL (ref 1.7–2.4)

## 2023-12-10 LAB — AMMONIA: Ammonia: 46 umol/L — ABNORMAL HIGH (ref 9–35)

## 2023-12-10 LAB — LITHIUM LEVEL: Lithium Lvl: 0.29 mmol/L — ABNORMAL LOW (ref 0.60–1.20)

## 2023-12-10 LAB — CALCITRIOL (1,25 DI-OH VIT D): Vit D, 1,25-Dihydroxy: 8 pg/mL — ABNORMAL LOW (ref 24.8–81.5)

## 2023-12-10 LAB — PHOSPHORUS: Phosphorus: 3.3 mg/dL (ref 2.5–4.6)

## 2023-12-10 MED ORDER — LACTULOSE 10 GM/15ML PO SOLN
30.0000 g | Freq: Two times a day (BID) | ORAL | Status: AC
  Administered 2023-12-10 (×2): 30 g via ORAL
  Filled 2023-12-10 (×2): qty 60

## 2023-12-10 MED ORDER — DIVALPROEX SODIUM 250 MG PO DR TAB: 250.0000 mg | DELAYED_RELEASE_TABLET | Freq: Two times a day (BID) | ORAL | Status: DC

## 2023-12-10 NOTE — Plan of Care (Signed)
  Problem: Education: Goal: Knowledge of risk factors and measures for prevention of condition will improve Outcome: Progressing   Problem: Coping: Goal: Psychosocial and spiritual needs will be supported Outcome: Progressing   Problem: Respiratory: Goal: Will maintain a patent airway Outcome: Progressing Goal: Complications related to the disease process, condition or treatment will be avoided or minimized Outcome: Progressing   Problem: Education: Goal: Knowledge of General Education information will improve Description: Including pain rating scale, medication(s)/side effects and non-pharmacologic comfort measures Outcome: Progressing   Problem: Health Behavior/Discharge Planning: Goal: Ability to manage health-related needs will improve Outcome: Progressing   Problem: Clinical Measurements: Goal: Ability to maintain clinical measurements within normal limits will improve Outcome: Progressing Goal: Will remain free from infection Outcome: Progressing Goal: Diagnostic test results will improve Outcome: Progressing Goal: Respiratory complications will improve Outcome: Progressing Goal: Cardiovascular complication will be avoided Outcome: Progressing   Problem: Activity: Goal: Risk for activity intolerance will decrease Outcome: Progressing   Problem: Nutrition: Goal: Adequate nutrition will be maintained Outcome: Progressing   Problem: Coping: Goal: Level of anxiety will decrease Outcome: Progressing   

## 2023-12-10 NOTE — Progress Notes (Signed)
 PROGRESS NOTE        PATIENT DETAILS Name: Paula Dickson Age: 58 y.o. Sex: female Date of Birth: 10-08-1966 Admit Date: 12/02/2023 Admitting Physician Emeline CHRISTELLA Idol, DO ERE:[email or web address]  Brief Summary: Patient is a 58 y.o.  female with history of bipolar disorder, hyperthyroidism-please continue with altered mental status-found to have COVID-19 infection.  Significant events: 1/5>> admit to [facility]  Significant studies: 1/5>> CXR: Left basilar airspace disease. 1/5>> MRI brain: No acute intracranial abnormality 1/5>> NH4:<10 1/5>> TSH: Within normal limit 1/6>> EEG: No seizures 1/6>> UDS: Negative 1/6>> CSF: WBC 1, protein 48 1/7>> echo: EF 55-60%  Significant microbiology data: 1/5>> COVID PCR: Positive 1/5>> influenza/RSV PCR: Negative 1/6>> blood culture: Negative 1/6>> CSF culture: Negative 1/6>> CSF biofire panel: Negative  Procedures: 1/6>> fluoroscopy-guided LP  Consults: Neurology Psychiatry  Subjective: Patient in bed, appears comfortable, denies any headache, no fever, no chest pain or pressure, no shortness of breath , no abdominal pain. No focal weakness.  Says her tremors have been chronic and present for 2-3 years if not more  Objective: Vitals: Blood pressure (!) 143/79, pulse 66, temperature 98.6 F (37 C), temperature source Oral, resp. rate 11, height 5' 1 (1.549 m), weight 59.7 kg, SpO2 94%.   Exam:  Awake Alert, No new F.N deficits, bilateral upper extremity tremors which are chronic [place].AT,PERRAL Supple Neck, No JVD,   Symmetrical Chest wall movement, Good air movement bilaterally, CTAB RRR,No Gallops, Rubs or new Murmurs,  +ve B.Sounds, Abd Soft, No tenderness,   No Cyanosis, Clubbing or edema   Assessment/Plan: Acute metabolic encephalopathy Etiology unclear but suspect multifactorial-from COVID-19 infection-mild elevation in lithium  levels-mild hypernatremia-AKI all playing a role. Neuro imaging/CSF  analysis/Spot EEG all negative Significantly better-completely awake and alert and back to baseline Appreciate neurology input.  Tremors Significantly better-only minimal-has essentially resolved. Unclear significance  COVID-19 infection On room air-minimal infiltrate seen on x-ray on left base-possible aspiration rather than from COVID. Supportive care at this point  ?  Aspiration pneumonia Small infiltrate on left lung base-could have aspirated due to encephalopathy Rocephin  x 5 days total completed on 1/10-remains clinically stable Evaluated by SLP-placed on regular diet on 1/8-will follow closely.  Hypernatremia Resolved with IVF-encourage oral intake.  Hypercalcemia Mild, PTH levels mildly elevated in the clinical scenario, 1,25 vitamin D  levels are low, calcitriol  levels are pending, hypercalcemia could be due to dehydration and reduced by high levels of lithium , corrected calcium level still elevated, continue to hydrate, calcitonin initiated on 12/09/2023.  Will need outpatient endocrine follow-up and further workup for hypercalcemia upon discharge.  AKI on CKD 2 AKI likely hemodynamically mediated Slowly improving with supportive care Follow electrolytes closely.  PAF EKG on admit consistent with A-fib Has been sinus rhythm on telemetry since admit TSH stable With stable EF Possible that A-fib triggered by COVID-19 infection-appears brief-not sure if she is a good long-term candidate for anticoagulation- given psych issues-?  Compliance For now-monitor off anticoagulation  History of hyperthyroidism Does not appear to be on Tapazole  any longer Not sure if she is on propranolol -on home med list TSH within normal limits Stable for outpatient monitoring  Bipolar disorder Initially both Depakote /lithium  were held-once patient improved-psychiatry evaluation was requested-Depakote  was restarted-however on. Psych managing her medications currently off of lithium  and  Depakote .  Lithium  levels on 12/10/2023 are stable.  Debility/deconditioning Spoke with Sister-[name]-1/7-mostly bedbound-able  to take a few steps to go to the bathroom/kitchen-but basically stopped walking long distance last week of November.  Apparently she is afraid of falling.  For the past several days-mostly bedbound-with poor oral intake.  No sick contacts. PT/OT/SLP eval-SNF commended.  BMI: Estimated body mass index is 24.87 kg/m as calculated from the following:   Height as of this encounter: 5' 1 (1.549 m).   Weight as of this encounter: 59.7 kg.   Code status:   Code Status: Full Code   DVT Prophylaxis: heparin  injection 5,000 Units Start: 12/04/23 0000   Family Communication: Sister-[name]-[phone] updated over the phone in detail on 12/10/2023   Disposition Plan: Status is: Inpatient Remains inpatient appropriate because: Severity of illness   Planned Discharge Destination: SNF   Diet: Diet Order             Diet regular Fluid consistency: Thin  Diet effective now                    MEDICATIONS: Scheduled Meds:  calcitonin (salmon)  1 spray Alternating Nares Daily   feeding supplement  237 mL Oral BID BM   heparin  injection (subcutaneous)  5,000 Units Subcutaneous Q8H   lactulose   30 g Oral BID   mouth rinse  15 mL Mouth Rinse 4 times per day   Continuous Infusions:    PRN Meds:.acetaminophen  **OR** acetaminophen , hydrALAZINE , metoprolol  tartrate, ondansetron  **OR** ondansetron  (ZOFRAN ) IV, mouth rinse   I have personally reviewed following labs and imaging studies  LABORATORY DATA: Recent Labs  Lab 12/06/23 0854 12/07/23 0543 12/08/23 0542 12/09/23 0550 12/10/23 0943  WBC 6.9 6.3 5.3 6.6 7.3  HGB 12.6 11.8* 11.5* 12.0 12.2  HCT 38.9 37.3 35.8* 37.4 37.7  PLT 139* 143* 141* 166 221  MCV 96.0 97.9 96.0 96.9 96.2  MCH 31.1 31.0 30.8 31.1 31.1  MCHC 32.4 31.6 32.1 32.1 32.4  RDW 15.8* 16.1* 16.1* 15.7* 15.5  LYMPHSABS 2.6 2.7 2.2  2.3 2.0  MONOABS 0.9 0.6 0.7 0.8 0.7  EOSABS 0.2 0.3 0.2 0.3 0.3  BASOSABS 0.0 0.0 0.0 0.0 0.0    Recent Labs  Lab 12/03/23 1602 12/03/23 1911 12/04/23 0510 12/05/23 0751 12/06/23 0854 12/07/23 0543 12/08/23 0542 12/09/23 0550 12/10/23 0434 12/10/23 0943  NA  --   --  150*   < > 141 142 142 138 139  --   K  --   --  4.3   < > 4.4 4.7 4.5 4.2 5.0  --   CL  --   --  117*   < > 109 107 109 105 104  --   CO2  --   --  22   < > 23 22 25 25  21*  --   ANIONGAP  --   --  11   < > 9 13 8 8 14   --   GLUCOSE  --   --  78   < > 115* 102* 98 139* 93  --   BUN  --   --  15   < > 16 20 26* 24* 24*  --   CREATININE  --   --  1.43*   < > 1.26* 1.34* 1.16* 1.29* 1.22*  --   AST  --   --   --   --   --   --   --  34 22  --   ALT  --   --   --   --   --   --   --  37 32  --   ALKPHOS  --   --   --   --   --   --   --  99 95  --   BILITOT  --   --   --   --   --   --   --  0.4 0.7  --   ALBUMIN  --   --   --   --   --   --   --  2.9* 2.9*  --   CRP  --   --  3.3*  --   --   --   --   --   --   --   LATICACIDVEN 1.2 0.9  --   --   --   --   --   --   --   --   AMMONIA  --   --  25  --   --   --   --   --   --  46*  BNP  --   --   --   --   --   --   --  7.3  --   --   MG  --   --   --   --   --   --   --  1.9  --  1.9  CALCIUM  --   --  10.1   < > 10.5* 10.5* 10.4* 10.3 10.2  --    < > = values in this interval not displayed.     Recent Labs  Lab 12/03/23 1602 12/03/23 1911 12/04/23 0510 12/05/23 0751 12/06/23 9145 12/07/23 0543 12/08/23 0542 12/09/23 0550 12/10/23 0434 12/10/23 0943  CRP  --   --  3.3*  --   --   --   --   --   --   --   LATICACIDVEN 1.2 0.9  --   --   --   --   --   --   --   --   AMMONIA  --   --  25  --   --   --   --   --   --  46*  BNP  --   --   --   --   --   --   --  7.3  --   --   MG  --   --   --   --   --   --   --  1.9  --  1.9  CALCIUM  --   --  10.1   < > 10.5* 10.5* 10.4* 10.3 10.2  --    < > = values in this interval not displayed.       RADIOLOGY STUDIES/RESULTS: No results found.    LOS: 7 days  Signature  -    Lavada Stank M.D on 12/10/2023 at 10:52 AM   -  To page go to [email or web address]

## 2023-12-10 NOTE — Consult Note (Addendum)
 [facility] Face-to-Face Psychiatry Consult   Reason for Consult:  Significant psych history on lithium /Depakote /Zyprexa -came in with confusion-lithium  levels are mildly elevated-had hypercalcemia/hypernatremia-she has improved with supportive care-discussed with neurology-wondering if you all could see her and see if we should resume her lithium  and other medications.  Not sure if the electrolyte changes-mental status was from lithium  toxicity-although levels only minimally elevated.  Referring Physician:  Dr. Raenelle  Patient Identification: Paula Dickson MRN:  [id number] Principal Diagnosis: Acute metabolic encephalopathy Diagnosis:  Principal Problem:   Acute metabolic encephalopathy Active Problems:   Hypercalcemia   Hyperthyroidism   Schizoaffective disorder, bipolar type (HCC)   AKI (acute kidney injury) (HCC)   Paroxysmal atrial fibrillation (HCC)   COVID-19 virus infection   Total Time spent with patient: 30 minutes    Subjective:   Paula Dickson is a 58 y.o. female patient with a history of schizoaffective disorder-bipolar type who presents to [facility] due to confucian and lethargy behavior. Psych consult placed for  psych history on Lithium /Zyprexa  and Depakote . Came in with confusion. Lithium  levels are mildly elevated.   1/12: Spoke with nurse who states she had to feed her as patient unable to feed herself due to tremor. I spoke with patient's sister who states that she has had new onset tremor the past 3 months which she did not have before; is not aware of any lithium  levels during this time but notes patient became more confused and less responsive 2 weeks ago and bedbound. I discussed r/b/a of lithium  and due to continued concerns for lithium  toxicity plan to stop it and switch to Depakote  or Trileptal . I ordered a stat level.  Patient seen at bedside and with severe hand-flapping tremor, completed finger to nose neuro exam and patient is ataxic. She is alert but does  not remember who brought her to the hospital or why she is here. Patient is oriented to self and location, month but not date or year stating it is 2024. States she has had difficulty walking and had falls at one point but does not remember when. Patient denies wanting to be dead or feeling hopeless. She denies any suicidal or homicidal ideation, as well as any auditory or visual hallucinations.  1/13: Patient seen and reassessed today. She is observed waving at this provider though her window. She presents with a bright affect and smiling. She engages in conversation, asking about the providers weekend. She reports her sister visited. She is able to recall the changes made to her lithium  by the previous provider. Patient did display some periods of memory fogginess however she remained appropriate. She reports that she is in agreeable with the change from Lithium  to Depakote  citing an increase in falls at home and increase in tremors. She endorses eating and sleeping well. Remains interested in physical therapy during this stay and transitioning to rehab. Patient denies any suicidal ideations, homicidal ideations and or hallucinations at this time. She reports compliance with her medications and denies any additional side effects.   Past Psychiatric History:  Schizoaffective disorder, Bipolar type, Generalized anxiety disorder.  Multiple inpatient hospitalizations. Inpatient at [facility] 08/23, [facility] 02/2022, [facility] 03/2022, [facility] 08/2022, 12/202, 09/2020.   Past Medical History:  Past Medical History:  Diagnosis Date   Bipolar affective disorder (HCC)    Bipolar disorder (HCC)    History of arthritis    History of chicken pox    History of depression    History of genital warts  history of heart murmur    History of high blood pressure    History of thyroid  disease    History of UTI    Hypertension    Low TSH level 07/13/2017   Schizophrenia (HCC)     Past Surgical  History:  Procedure Laterality Date   ABLATION ON ENDOMETRIOSIS     CYST REMOVAL NECK     around 11 years ago /benign   MULTIPLE TOOTH EXTRACTIONS     Family History:  Family History  Problem Relation Age of Onset   Arthritis Father    Hyperlipidemia Father    High blood pressure Father    Diabetes Sister    Diabetes Mother    Diabetes Brother    Mental illness Brother    Alcohol  abuse Paternal Uncle    Alcohol  abuse Paternal Grandfather    Breast cancer Maternal Aunt    Breast cancer Paternal Aunt    High blood pressure Sister    Mental illness Other        runs in family   Family Psychiatric  History: Denies Social History:  Social History   Substance and Sexual Activity  Alcohol  Use Not Currently     Social History   Substance and Sexual Activity  Drug Use Not Currently    Social History   Socioeconomic History   Marital status: Single    Spouse name: Not on file   Number of children: Not on file   Years of education: Not on file   Highest education level: Patient refused  Occupational History   Not on file  Tobacco Use   Smoking status: Never   Smokeless tobacco: Never  Vaping Use   Vaping status: Never Used  Substance and Sexual Activity   Alcohol  use: Not Currently   Drug use: Not Currently   Sexual activity: Not Currently  Other Topics Concern   Not on file  Social History Narrative   ** Merged History Encounter **       Social Drivers of Health   Financial Resource Strain: Not on file  Food Insecurity: Not on file  Transportation Needs: Not on file  Physical Activity: Not on file  Stress: Not on file  Social Connections: Not on file   Additional Social History:    Allergies:  No Known Allergies  Labs:  Results for orders placed or performed during the hospital encounter of 12/02/23 (from the past 48 hours)  VITAMIN D  25 Hydroxy (Vit-D Deficiency, Fractures)     Status: Abnormal   Collection Time: 12/09/23  5:50 AM  Result Value  Ref Range   Vit D, 25-Hydroxy 27.01 (L) 30 - 100 ng/mL    Comment: (NOTE) Vitamin D  deficiency has been defined by the Institute of Medicine  and an Endocrine Society practice guideline as a level of serum 25-OH  vitamin D  less than 20 ng/mL (1,2). The Endocrine Society went on to  further define vitamin D  insufficiency as a level between 21 and 29  ng/mL (2).  1. IOM (Institute of Medicine). 2010. Dietary reference intakes for  calcium and D. [place]: [employer]. 2. Holick MF, Binkley Knik-[place], Bischoff-Ferrari HA, et al. Evaluation,  treatment, and prevention of vitamin D  deficiency: an Endocrine  Society clinical practice guideline, JCEM. 2011 Jul; 96(7): 1911-30.  Performed at [facility], [address] [place], [place] [address]   Comprehensive metabolic panel     Status: Abnormal   Collection Time: 12/09/23  5:50  AM  Result Value Ref Range   Sodium 138 135 - 145 mmol/L   Potassium 4.2 3.5 - 5.1 mmol/L   Chloride 105 98 - 111 mmol/L   CO2 25 22 - 32 mmol/L   Glucose, Bld 139 (H) 70 - 99 mg/dL    Comment: Glucose reference range applies only to samples taken after fasting for at least 8 hours.   BUN 24 (H) 6 - 20 mg/dL   Creatinine, Ser 8.70 (H) 0.44 - 1.00 mg/dL   Calcium 89.6 8.9 - 89.6 mg/dL   Total Protein 6.0 (L) 6.5 - 8.1 g/dL   Albumin 2.9 (L) 3.5 - 5.0 g/dL   AST 34 15 - 41 U/L   ALT 37 0 - 44 U/L   Alkaline Phosphatase 99 38 - 126 U/L   Total Bilirubin 0.4 0.0 - 1.2 mg/dL   GFR, Estimated 48 (L) >60 mL/min    Comment: (NOTE) Calculated using the CKD-EPI Creatinine Equation (2021)    Anion gap 8 5 - 15    Comment: Performed at [facility], [address], [place], [place] [address]  CBC with Differential/Platelet     Status: Abnormal   Collection Time: 12/09/23  5:50 AM  Result Value Ref Range   WBC 6.6 4.0 - 10.5 K/uL   RBC 3.86 (L) 3.87 - 5.11 MIL/uL   Hemoglobin 12.0 12.0 - 15.0 g/dL   HCT 62.5 63.9 - 53.9 %   MCV 96.9  80.0 - 100.0 fL   MCH 31.1 26.0 - 34.0 pg   MCHC 32.1 30.0 - 36.0 g/dL   RDW 84.2 (H) 88.4 - 84.4 %   Platelets 166 150 - 400 K/uL   nRBC 0.0 0.0 - 0.2 %   Neutrophils Relative % 47 %   Neutro Abs 3.1 1.7 - 7.7 K/uL   Lymphocytes Relative 35 %   Lymphs Abs 2.3 0.7 - 4.0 K/uL   Monocytes Relative 12 %   Monocytes Absolute 0.8 0.1 - 1.0 K/uL   Eosinophils Relative 5 %   Eosinophils Absolute 0.3 0.0 - 0.5 K/uL   Basophils Relative 0 %   Basophils Absolute 0.0 0.0 - 0.1 K/uL   Immature Granulocytes 1 %   Abs Immature Granulocytes 0.03 0.00 - 0.07 K/uL    Comment: Performed at [facility], [address] [place], [place] [address]  Brain natriuretic peptide     Status: None   Collection Time: 12/09/23  5:50 AM  Result Value Ref Range   B Natriuretic Peptide 7.3 0.0 - 100.0 pg/mL    Comment: Performed at [facility], [address], [place], [place] [address]  Magnesium      Status: None   Collection Time: 12/09/23  5:50 AM  Result Value Ref Range   Magnesium  1.9 1.7 - 2.4 mg/dL    Comment: Performed at [facility], [address] [place], [place] [address]  Phosphorus     Status: None   Collection Time: 12/09/23  5:50 AM  Result Value Ref Range   Phosphorus 3.7 2.5 - 4.6 mg/dL    Comment: Performed at [facility], [address] [place], [place] [address]  Lithium  level     Status: None   Collection Time: 12/09/23  1:32 PM  Result Value Ref Range   Lithium  Lvl 0.60 0.60 - 1.20 mmol/L    Comment: Performed at [facility], [address] [place], [place] [address]  Lithium   level     Status: Abnormal   Collection Time: 12/10/23  4:34 AM  Result Value Ref Range   Lithium  Lvl 0.29 (L) 0.60 - 1.20 mmol/L    Comment: Performed at [facility] Lab, [address], [place], [place] [address]  Comprehensive metabolic panel     Status: Abnormal   Collection Time: 12/10/23  4:34 AM  Result Value Ref Range   Sodium 139 135 - 145 mmol/L   Potassium  5.0 3.5 - 5.1 mmol/L   Chloride 104 98 - 111 mmol/L   CO2 21 (L) 22 - 32 mmol/L   Glucose, Bld 93 70 - 99 mg/dL    Comment: Glucose reference range applies only to samples taken after fasting for at least 8 hours.   BUN 24 (H) 6 - 20 mg/dL   Creatinine, Ser 8.77 (H) 0.44 - 1.00 mg/dL   Calcium 89.7 8.9 - 89.6 mg/dL   Total Protein 5.7 (L) 6.5 - 8.1 g/dL   Albumin 2.9 (L) 3.5 - 5.0 g/dL   AST 22 15 - 41 U/L    Comment: HEMOLYSIS AT THIS LEVEL MAY AFFECT RESULT   ALT 32 0 - 44 U/L    Comment: HEMOLYSIS AT THIS LEVEL MAY AFFECT RESULT   Alkaline Phosphatase 95 38 - 126 U/L   Total Bilirubin 0.7 0.0 - 1.2 mg/dL    Comment: HEMOLYSIS AT THIS LEVEL MAY AFFECT RESULT   GFR, Estimated 51 (L) >60 mL/min    Comment: (NOTE) Calculated using the CKD-EPI Creatinine Equation (2021)    Anion gap 14 5 - 15    Comment: Performed at [facility], [address] [place], [place] [address]  Ammonia     Status: Abnormal   Collection Time: 12/10/23  9:43 AM  Result Value Ref Range   Ammonia 46 (H) 9 - 35 umol/L    Comment: Performed at [facility], [address] [place], [place] [address]  Magnesium      Status: None   Collection Time: 12/10/23  9:43 AM  Result Value Ref Range   Magnesium  1.9 1.7 - 2.4 mg/dL    Comment: Performed at [facility], [address], [place], [place] [address]  Phosphorus     Status: None   Collection Time: 12/10/23  9:43 AM  Result Value Ref Range   Phosphorus 3.3 2.5 - 4.6 mg/dL    Comment: Performed at [facility], [address], [place], [place] [address]  CBC with Differential/Platelet     Status: None   Collection Time: 12/10/23  9:43 AM  Result Value Ref Range   WBC 7.3 4.0 - 10.5 K/uL   RBC 3.92 3.87 - 5.11 MIL/uL   Hemoglobin 12.2 12.0 - 15.0 g/dL   HCT 62.2 63.9 - 53.9 %   MCV 96.2 80.0 - 100.0 fL   MCH 31.1 26.0 - 34.0 pg   MCHC 32.4 30.0 - 36.0 g/dL   RDW 84.4 88.4 - 84.4 %   Platelets 221 150 - 400 K/uL   nRBC 0.0 0.0  - 0.2 %   Neutrophils Relative % 60 %   Neutro Abs 4.3 1.7 - 7.7 K/uL   Lymphocytes Relative 27 %   Lymphs Abs 2.0 0.7 - 4.0 K/uL   Monocytes Relative 10 %   Monocytes Absolute 0.7 0.1 - 1.0 K/uL   Eosinophils Relative 3 %   Eosinophils Absolute 0.3 0.0 - 0.5 K/uL   Basophils Relative 0 %  Basophils Absolute 0.0 0.0 - 0.1 K/uL   Immature Granulocytes 0 %   Abs Immature Granulocytes 0.03 0.00 - 0.07 K/uL    Comment: Performed at [facility], [address] [place], [place] [address]    Current Facility-Administered Medications  Medication Dose Route Frequency Provider Last Rate Last Admin   acetaminophen  (TYLENOL ) tablet 650 mg  650 mg Oral Q6H PRN Lonzell Emeline HERO, DO       Or   acetaminophen  (TYLENOL ) suppository 650 mg  650 mg Rectal Q6H PRN Lonzell Emeline HERO, DO       calcitonin (salmon) (MIACALCIN MARCIANA) nasal spray 1 spray  1 spray Alternating Nares Daily Dennise Lavada POUR, MD   1 spray at 12/10/23 9062   feeding supplement (ENSURE ENLIVE / ENSURE PLUS) liquid 237 mL  237 mL Oral BID BM Hall, Carole N, DO   237 mL at 12/10/23 1526   heparin  injection 5,000 Units  5,000 Units Subcutaneous Q8H Samtani, Jai-Gurmukh, MD   5,000 Units at 12/10/23 1526   hydrALAZINE  (APRESOLINE ) injection 10 mg  10 mg Intravenous Q6H PRN Raenelle Donalda HERO, MD   10 mg at 12/06/23 2004   lactulose  (CHRONULAC ) 10 GM/15ML solution 30 g  30 g Oral BID Singh, Prashant K, MD   30 g at 12/10/23 1205   metoprolol  tartrate (LOPRESSOR ) injection 5 mg  5 mg Intravenous Q6H PRN Gardner, Jared M, DO       ondansetron  (ZOFRAN ) tablet 4 mg  4 mg Oral Q6H PRN Lonzell Emeline HERO, DO       Or   ondansetron  (ZOFRAN ) injection 4 mg  4 mg Intravenous Q6H PRN Gardner, Jared M, DO       Oral care mouth rinse  15 mL Mouth Rinse 4 times per day Singh, Prashant K, MD   15 mL at 12/10/23 1528   Oral care mouth rinse  15 mL Mouth Rinse PRN Dennise Lavada POUR, MD        Musculoskeletal: Strength & Muscle Tone: within  normal limits Gait & Station: normal Patient leans: N/A  Psychiatric Specialty Exam:  Presentation  General Appearance: Appropriate for Environment; Casual   Eye Contact:Good   Speech:Clear and Coherent; Normal Rate   Speech Volume: normal  Handedness:Right    Mood and Affect  Mood:Euthymic   Affect:Congruent; Appropriate    Thought Process  Thought Processes:Coherent; Linear   Descriptions of Associations:Intact   Orientation:Full (Time, Place and Person)   Thought Content:Logical   History of Schizophrenia/Schizoaffective disorder: yes  Duration of Psychotic Symptoms: > 6 months  Hallucinations:Denies   Ideas of Reference: none  Suicidal Thoughts:Denies   Homicidal Thoughts:Denies    Sensorium  Memory:Immediate Fair; Recent Fair; Remote Fair   Judgment:Fair   Insight:Fair    Executive Functions  Concentration:Fair   Attention Span:Poor   Recall:Poor   Fund of Knowledge:Fair   Language:Fair    Psychomotor Activity  Psychomotor Activity:No data recorded    Assets  Assets:Financial Resources/Insurance; Housing   Sleep  Sleep:No data recorded   Physical Exam: Physical Exam Vitals and nursing note reviewed.  Constitutional:      Appearance: Normal appearance. She is normal weight.  Eyes:     Pupils: Pupils are equal, round, and reactive to light.  Skin:    General: Skin is warm.     Capillary Refill: Capillary refill takes less than 2 seconds.  Neurological:     General: No focal deficit present.     Mental Status: She  is alert and oriented to person, place, and time. Mental status is at baseline.  Psychiatric:        Mood and Affect: Mood normal.        Behavior: Behavior normal.        Thought Content: Thought content normal.        Judgment: Judgment normal.    Review of Systems  Psychiatric/Behavioral:  The patient is not nervous/anxious.   All other systems reviewed and are negative.  Blood  pressure (!) 145/86, pulse 80, temperature 98.3 F (36.8 C), temperature source Oral, resp. rate 10, height 5' 1 (1.549 m), weight 59.7 kg, SpO2 98%. Body mass index is 24.87 kg/m.  Treatment Plan Summary: Daily contact with patient to assess and evaluate symptoms and progress in treatment, Medication management, and Plan   -patient with symptoms of lithium  toxicity on my exam, ordered stat lithium  level repeat - Continue to hold lithium . , Level 0.60. Patient no longer a candidate for Lithium .  -once patient's neurological symptoms of ataxia improve I would restart Depakote  250 mg BID as previously planned in place of lithium  (will order to start tomorrow 12/11/2023). Level ordered 1/17. -Will benefit from PT/OT consult while in the hospital. Increased falls at homes, ataxia, and deconditioning.   -TOC referral for SNF to help with reconditioning and strength training.   Labs reviewed: Including abnormal sodium of 138,  elevated creatinine 1.29,  calcium 10.3, elevated lithium  level of 1.28--> .58, ordered repeat Level VPA level < 10 LFT's wnl  Disposition: No evidence of imminent risk to self or others at present.   Patient does not meet criteria for psychiatric inpatient admission. Supportive therapy provided about ongoing stressors. Discussed crisis plan, support from social network, calling 911, coming to the Emergency Department, and calling Suicide Hotline.  Majel GORMAN Ramp, FNP 12/10/2023 4:21 PM

## 2023-12-10 NOTE — Plan of Care (Signed)
  Problem: Education: Goal: Knowledge of risk factors and measures for prevention of condition will improve Outcome: Progressing   

## 2023-12-11 DIAGNOSIS — G9341 Metabolic encephalopathy: Secondary | ICD-10-CM | POA: Diagnosis not present

## 2023-12-11 LAB — COMPREHENSIVE METABOLIC PANEL
ALT: 28 U/L (ref 0–44)
AST: 16 U/L (ref 15–41)
Albumin: 3.4 g/dL — ABNORMAL LOW (ref 3.5–5.0)
Alkaline Phosphatase: 98 U/L (ref 38–126)
Anion gap: 9 (ref 5–15)
BUN: 18 mg/dL (ref 6–20)
CO2: 27 mmol/L (ref 22–32)
Calcium: 10.9 mg/dL — ABNORMAL HIGH (ref 8.9–10.3)
Chloride: 106 mmol/L (ref 98–111)
Creatinine, Ser: 1.02 mg/dL — ABNORMAL HIGH (ref 0.44–1.00)
GFR, Estimated: >60 mL/min (ref >60)
Glucose, Bld: 113 mg/dL — ABNORMAL HIGH (ref 70–99)
Potassium: 5.8 mmol/L — ABNORMAL HIGH (ref 3.5–5.1)
Sodium: 142 mmol/L (ref 135–145)
Total Bilirubin: 0.7 mg/dL (ref 0.0–1.2)
Total Protein: 6.9 g/dL (ref 6.5–8.1)

## 2023-12-11 LAB — AMMONIA: Ammonia: 37 umol/L — ABNORMAL HIGH (ref 9–35)

## 2023-12-11 LAB — MAGNESIUM: Magnesium: 2 mg/dL (ref 1.7–2.4)

## 2023-12-11 MED ORDER — LACTULOSE 10 GM/15ML PO SOLN: 30.0000 g | Freq: Every day | ORAL | Status: DC

## 2023-12-11 MED ORDER — CALCITONIN (SALMON) 200 UNIT/ML IJ SOLN
4.0000 [IU]/kg | Freq: Two times a day (BID) | INTRAMUSCULAR | Status: AC
Start: 2023-12-11 — End: 2023-12-11
  Administered 2023-12-11 (×2): 238 [IU] via INTRAMUSCULAR
  Filled 2023-12-11 (×2): qty 1.19

## 2023-12-11 MED ORDER — OXCARBAZEPINE 300 MG PO TABS
300.0000 mg | ORAL_TABLET | Freq: Every day | ORAL | Status: DC
  Administered 2023-12-11 – 2023-12-13 (×3): 300 mg via ORAL
  Filled 2023-12-11 (×4): qty 1

## 2023-12-11 MED ORDER — SODIUM CHLORIDE 0.9 % IV BOLUS
500.0000 mL | Freq: Once | INTRAVENOUS | Status: AC
  Administered 2023-12-11: 500 mL via INTRAVENOUS

## 2023-12-11 MED ORDER — FUROSEMIDE 10 MG/ML IJ SOLN
20.0000 mg | Freq: Once | INTRAMUSCULAR | Status: AC
  Administered 2023-12-11: 20 mg via INTRAVENOUS
  Filled 2023-12-11: qty 2

## 2023-12-11 MED ORDER — CALCITONIN (SALMON) 200 UNIT/ML IJ SOLN: 4.0000 [IU]/kg | Freq: Two times a day (BID) | INTRAMUSCULAR | Status: DC

## 2023-12-11 MED ORDER — SODIUM ZIRCONIUM CYCLOSILICATE 10 G PO PACK
10.0000 g | PACK | Freq: Two times a day (BID) | ORAL | Status: AC
  Administered 2023-12-11 (×2): 10 g via ORAL
  Filled 2023-12-11 (×2): qty 1

## 2023-12-11 MED ORDER — LACTULOSE 10 GM/15ML PO SOLN
30.0000 g | Freq: Two times a day (BID) | ORAL | Status: DC
Start: 2023-12-11 — End: 2023-12-14
  Administered 2023-12-11 – 2023-12-14 (×7): 30 g via ORAL
  Filled 2023-12-11 (×7): qty 60

## 2023-12-11 NOTE — Progress Notes (Signed)
 Physical Therapy Treatment Patient Details Name: Paula Dickson MRN: [id number] DOB: 22-Feb-1966 Today's Date: 12/11/2023   History of Present Illness 58 y.o.  female admitted 12/02/23 with altered mental status-found to have COVID-19 infection. PMH significant for tremors, acute metabolic encephalopathy, asipiration PNA, hypernatremia, hypercalcemia, AKI on CKD 2, PAF, hyperthyroidism, BPD, debility, schizophrenia    PT Comments  Pt received in supine and agreeable to session. Pt noted to have vomited upon arrival with pt reporting awareness of it, but did not call nursing. Pt reports no further nausea and no lightheadedness sitting EOB. Pt demonstrates L lateral lean while sitting EOB that worsens with increased fatigue despite cues requiring assist to correct. Pt able to laterally scoot at EOB and pt initiates STS and steps to recliner with min A +2. Once sitting in recliner, pt noted to have an episode of looking at the ceiling and not responding for ~10 seconds, BP taken and stable with feet elevated and trunk slightly reclined and RN notified. Pt initially states she feels fine, however later states that she had felt faint. Pt continues to benefit from PT services to progress toward functional mobility goals.    If plan is discharge home, recommend the following: Assistance with cooking/housework;Assist for transportation;Two people to help with walking and/or transfers;Assistance with feeding;Direct supervision/assist for medications management;Direct supervision/assist for financial management;Supervision due to cognitive status   Can travel by private vehicle     No  Equipment Recommendations   (TBD)    Recommendations for Other Services       Precautions / Restrictions Precautions Precautions: Fall Restrictions Weight Bearing Restrictions Per Provider Order: No     Mobility  Bed Mobility Overal bed mobility: Needs Assistance Bed Mobility: Supine to Sit     Supine to  sit: Mod assist, HOB elevated     General bed mobility comments: Cues for sequencing and mod A for trunk elevation. Pt able to scoot forward and laterally at EOB without physical assist, however demonstrates poor sitting balance with increased fatigue    Transfers Overall transfer level: Needs assistance Equipment used: 2 person hand held assist Transfers: Bed to chair/wheelchair/BSC     Step pivot transfers: Min assist, +2 physical assistance       General transfer comment: Pt initially completing lateral scoots towards recliner, however pt initiating STS and is able take a few steps with min A +2 for stability and guidance towards chair. Pt not reaching a full upright posture        Balance Overall balance assessment: Needs assistance Sitting-balance support: No upper extremity supported, Feet supported Sitting balance-Leahy Scale: Poor Sitting balance - Comments: Pt requires frequent cues and intermittent assist to maintain upright posture due to L lateral lean at EOB and in recliner   Standing balance support: Bilateral upper extremity supported, During functional activity, Reliant on assistive device for balance Standing balance-Leahy Scale: Poor Standing balance comment: reliant on x2 HHA for standing, min A x2                            Cognition Arousal: Alert Behavior During Therapy: Flat affect Overall Cognitive Status: Impaired/Different from baseline                                 General Comments: Decreased awareness and one episode of not responding and looking at ceiling after transfer. Mostly appropriate responses and  following simple commands during session though.        Exercises      General Comments General comments (skin integrity, edema, etc.): Pt noted to have vomited upon arrival, RN notified      Pertinent Vitals/Pain Pain Assessment Pain Assessment: No/denies pain     PT Goals (current goals can now be found  in the care plan section) Acute Rehab PT Goals Patient Stated Goal: pt unable to participate PT Goal Formulation: Patient unable to participate in goal setting Time For Goal Achievement: 12/19/23 Progress towards PT goals: Progressing toward goals    Frequency    Min 1X/week       AM-PAC PT 6 Clicks Mobility   Outcome Measure  Help needed turning from your back to your side while in a flat bed without using bedrails?: A Little Help needed moving from lying on your back to sitting on the side of a flat bed without using bedrails?: A Lot Help needed moving to and from a bed to a chair (including a wheelchair)?: Total Help needed standing up from a chair using your arms (e.g., wheelchair or bedside chair)?: Total Help needed to walk in hospital room?: Total Help needed climbing 3-5 steps with a railing? : Total 6 Click Score: 9    End of Session   Activity Tolerance: Patient tolerated treatment well;Patient limited by fatigue Patient left: in chair;with chair alarm set;with call bell/phone within reach Nurse Communication: Mobility status PT Visit Diagnosis: Unsteadiness on feet (R26.81);Other abnormalities of gait and mobility (R26.89);Muscle weakness (generalized) (M62.81)     Time: 8568-8496 PT Time Calculation (min) (ACUTE ONLY): 32 min  Charges:    $Therapeutic Activity: 23-37 mins PT General Charges $$ ACUTE PT VISIT: 1 Visit                     Darryle George, PTA Acute Rehabilitation Services Secure Chat Preferred  [phone]    Darryle George 12/11/2023, 4:00 PM

## 2023-12-11 NOTE — Progress Notes (Addendum)
 PROGRESS NOTE        PATIENT DETAILS Name: Paula Dickson Age: 58 y.o. Sex: female Date of Birth: 04-25-1966 Admit Date: 12/02/2023 Admitting Physician Emeline CHRISTELLA Idol, DO ERE:[email or web address]  Brief Summary: Patient is a 58 y.o.  female with history of bipolar disorder, hyperthyroidism-please continue with altered mental status-found to have COVID-19 infection.  Significant events: 1/5>> admit to [facility]  Significant studies: 1/5>> CXR: Left basilar airspace disease. 1/5>> MRI brain: No acute intracranial abnormality 1/5>> NH4:<10 1/5>> TSH: Within normal limit 1/6>> EEG: No seizures 1/6>> UDS: Negative 1/6>> CSF: WBC 1, protein 48 1/7>> echo: EF 55-60%  Significant microbiology data: 1/5>> COVID PCR: Positive 1/5>> influenza/RSV PCR: Negative 1/6>> blood culture: Negative 1/6>> CSF culture: Negative 1/6>> CSF biofire panel: Negative  Procedures: 1/6>> fluoroscopy-guided LP  Consults: Neurology Psychiatry  Subjective: Patient in bed, appears comfortable, denies any headache, no fever, no chest pain or pressure, no shortness of breath , no abdominal pain. No focal weakness.  Says her tremors have been chronic and present for 2-3 years if not more  Objective: Vitals: Blood pressure (!) 152/81, pulse 73, temperature 98.2 F (36.8 C), temperature source Oral, resp. rate 13, height 5' 1 (1.549 m), weight 59.7 kg, SpO2 95%.   Exam:  Awake Alert, No new F.N deficits, bilateral upper extremity tremors which are chronic [place].AT,PERRAL Supple Neck, No JVD,   Symmetrical Chest wall movement, Good air movement bilaterally, CTAB RRR,No Gallops, Rubs or new Murmurs,  +ve B.Sounds, Abd Soft, No tenderness,   No Cyanosis, Clubbing or edema   Assessment/Plan: Acute metabolic encephalopathy Etiology unclear but suspect multifactorial-from COVID-19 infection-mild elevation in lithium  levels-mild hypernatremia-AKI all playing a role. Neuro imaging/CSF  analysis/Spot EEG all negative Significantly better-completely awake and alert and back to baseline Appreciate neurology input.  Tremors Chronic in nature discussed with patient and sister present for multiple years Unclear significance, ammonia mildly elevated and trending down  COVID-19 infection On room air-minimal infiltrate seen on x-ray on left base-possible aspiration rather than from COVID. Supportive care at this point  ?  Aspiration pneumonia Small infiltrate on left lung base-could have aspirated due to encephalopathy Rocephin  x 5 days total completed on 1/10-remains clinically stable Evaluated by SLP-placed on regular diet on 1/8-will follow closely.  Hyperkalemia.  Treated, monitor.  Hypercalcemia Mild, PTH levels mildly elevated in the clinical scenario, 1,25 vitamin D  levels are low, calcitriol  levels are stable, hypercalcemia could be due to dehydration and reduced by high levels of lithium , corrected calcium level still elevated, continue to hydrate, calcitonin initiated on 12/09/2023, without much benefit, may require Zometa.  Will need outpatient endocrine follow-up and further workup for hypercalcemia upon discharge.  Case was discussed with nephrologist Dr. Dolan on 12/10/2022 likely culprit is lithium , this medication has already been discontinued permanently by psych.  Continue to monitor.  AKI on CKD 2 AKI likely hemodynamically mediated Slowly improving with supportive care Follow electrolytes closely.  PAF EKG on admit consistent with A-fib Has been sinus rhythm on telemetry since admit TSH stable With stable EF Possible that A-fib triggered by COVID-19 infection-appears brief-not sure if she is a good long-term candidate for anticoagulation- given psych issues-?  Compliance For now-monitor off anticoagulation  History of hyperthyroidism Does not appear to be on Tapazole  any longer Not sure if she is on propranolol -on home med list TSH within normal  limits Stable for outpatient monitoring  Bipolar disorder Initially both Depakote /lithium  were held-once patient improved-psychiatry evaluation was requested-Depakote  was restarted-however on. Psych managing her medications currently off of lithium  and Depakote .  Perhaps going to be placed on Trileptal  will defer this to psychiatry.  Debility/deconditioning Spoke with Sister-[name]-1/7-mostly bedbound-able to take a few steps to go to the bathroom/kitchen-but basically stopped walking long distance last week of November.  Apparently she is afraid of falling.  For the past several days-mostly bedbound-with poor oral intake.  No sick contacts. PT/OT/SLP eval-SNF commended.  BMI: Estimated body mass index is 24.87 kg/m as calculated from the following:   Height as of this encounter: 5' 1 (1.549 m).   Weight as of this encounter: 59.7 kg.   Code status:   Code Status: Full Code   DVT Prophylaxis: heparin  injection 5,000 Units Start: 12/04/23 0000   Family Communication: Sister-[name]-[phone] updated over the phone in detail on 12/10/2023   Disposition Plan: Status is: Inpatient Remains inpatient appropriate because: Severity of illness   Planned Discharge Destination: SNF   Diet: Diet Order             Diet regular Fluid consistency: Thin  Diet effective now                    MEDICATIONS: Scheduled Meds:  calcitonin (salmon)  1 spray Alternating Nares Daily   feeding supplement  237 mL Oral BID BM   furosemide   20 mg Intravenous Once   heparin  injection (subcutaneous)  5,000 Units Subcutaneous Q8H   lactulose   30 g Oral BID   mouth rinse  15 mL Mouth Rinse 4 times per day   sodium zirconium cyclosilicate   10 g Oral BID   Continuous Infusions:  sodium chloride        PRN Meds:.acetaminophen  **OR** acetaminophen , hydrALAZINE , metoprolol  tartrate, ondansetron  **OR** ondansetron  (ZOFRAN ) IV, mouth rinse   I have personally reviewed following labs and imaging  studies  LABORATORY DATA: Recent Labs  Lab 12/06/23 0854 12/07/23 0543 12/08/23 0542 12/09/23 0550 12/10/23 0943  WBC 6.9 6.3 5.3 6.6 7.3  HGB 12.6 11.8* 11.5* 12.0 12.2  HCT 38.9 37.3 35.8* 37.4 37.7  PLT 139* 143* 141* 166 221  MCV 96.0 97.9 96.0 96.9 96.2  MCH 31.1 31.0 30.8 31.1 31.1  MCHC 32.4 31.6 32.1 32.1 32.4  RDW 15.8* 16.1* 16.1* 15.7* 15.5  LYMPHSABS 2.6 2.7 2.2 2.3 2.0  MONOABS 0.9 0.6 0.7 0.8 0.7  EOSABS 0.2 0.3 0.2 0.3 0.3  BASOSABS 0.0 0.0 0.0 0.0 0.0    Recent Labs  Lab 12/07/23 0543 12/08/23 0542 12/09/23 0550 12/10/23 0434 12/10/23 0943 12/11/23 0458  NA 142 142 138 139  --  142  K 4.7 4.5 4.2 5.0  --  5.8*  CL 107 109 105 104  --  106  CO2 22 25 25  21*  --  27  ANIONGAP 13 8 8 14   --  9  GLUCOSE 102* 98 139* 93  --  113*  BUN 20 26* 24* 24*  --  18  CREATININE 1.34* 1.16* 1.29* 1.22*  --  1.02*  AST  --   --  34 22  --  16  ALT  --   --  37 32  --  28  ALKPHOS  --   --  99 95  --  98  BILITOT  --   --  0.4 0.7  --  0.7  ALBUMIN  --   --  2.9* 2.9*  --  3.4*  AMMONIA  --   --   --   --  46* 37*  BNP  --   --  7.3  --   --   --   MG  --   --  1.9  --  1.9 2.0  CALCIUM 10.5* 10.4* 10.3 10.2  --  10.9*     Recent Labs  Lab 12/07/23 0543 12/08/23 0542 12/09/23 0550 12/10/23 0434 12/10/23 0943 12/11/23 0458  AMMONIA  --   --   --   --  46* 37*  BNP  --   --  7.3  --   --   --   MG  --   --  1.9  --  1.9 2.0  CALCIUM 10.5* 10.4* 10.3 10.2  --  10.9*      RADIOLOGY STUDIES/RESULTS: No results found.    LOS: 8 days  Signature  -    Lavada Stank M.D on 12/11/2023 at 8:24 AM   -  To page go to [email or web address]

## 2023-12-11 NOTE — Consult Note (Addendum)
 [facility] Face-to-Face Psychiatry Consult   Reason for Consult:  Significant psych history on lithium /Depakote /Zyprexa -came in with confusion-lithium  levels are mildly elevated-had hypercalcemia/hypernatremia-she has improved with supportive care-discussed with neurology-wondering if you all could see her and see if we should resume her lithium  and other medications.  Not sure if the electrolyte changes-mental status was from lithium  toxicity-although levels only minimally elevated.  Referring Physician:  Dr. Raenelle  Patient Identification: Paula Dickson MRN:  [id number] Principal Diagnosis: Acute metabolic encephalopathy Diagnosis:  Principal Problem:   Acute metabolic encephalopathy Active Problems:   Hypercalcemia   Hyperthyroidism   Schizoaffective disorder, bipolar type (HCC)   AKI (acute kidney injury) (HCC)   Paroxysmal atrial fibrillation (HCC)   COVID-19 virus infection   Total Time spent with patient: 30 minutes    Subjective:   Paula Dickson is a 58 y.o. female patient with a history of schizoaffective disorder-bipolar type who presents to [facility] due to confucian and lethargy behavior. Psych consult placed for  psych history on Lithium /Zyprexa  and Depakote . Came in with confusion. Lithium  levels are mildly elevated.   1/12: Spoke with nurse who states she had to feed her as patient unable to feed herself due to tremor. I spoke with patient's sister who states that she has had new onset tremor the past 3 months which she did not have before; is not aware of any lithium  levels during this time but notes patient became more confused and less responsive 2 weeks ago and bedbound. I discussed r/b/a of lithium  and due to continued concerns for lithium  toxicity plan to stop it and switch to Depakote  or Trileptal . I ordered a stat level.  Patient seen at bedside and with severe hand-flapping tremor, completed finger to nose neuro exam and patient is ataxic. She is alert but does  not remember who brought her to the hospital or why she is here. Patient is oriented to self and location, month but not date or year stating it is 2024. States she has had difficulty walking and had falls at one point but does not remember when. Patient denies wanting to be dead or feeling hopeless. She denies any suicidal or homicidal ideation, as well as any auditory or visual hallucinations.  1/13: Patient seen and reassessed today. She is observed waving at this provider though her window. She presents with a bright affect and smiling. She engages in conversation, asking about the providers weekend. She reports her sister visited. She is able to recall the changes made to her lithium  by the previous provider. Patient did display some periods of memory fogginess however she remained appropriate. She reports that she is in agreeable with the change from Lithium  to Depakote  citing an increase in falls at home and increase in tremors. She endorses eating and sleeping well. Remains interested in physical therapy during this stay and transitioning to rehab. Patient denies any suicidal ideations, homicidal ideations and or hallucinations at this time. She reports compliance with her medications and denies any additional side effects.   1/14: Minimal change from what was described by Dr. Zouev. She remembers seh was supposed to go onto Depakote  and was surprised this was not started; agreed with rationale once explained. She is agreeable to starting oxcarb but wanted her sister to be informed of all med changes. Feels that her tremors are a little better now than before and shared she had had them for years, but worsened shortly before admission. No SI, HI, AH/VH. Adequate short term memory (able to describe  PT treatment, etc w/ no issues).  Called sister and explained med changes. Let her know about main s/e (hyponatremia ie low salt) as well as what to do (get blood test to test for salt if confusion). She  affirmed my s/o that there is no current concern for psychiatric deterioration, mostly concern that she will leave on ineffective med regimen d/t physical complaints. Did let her know oxcarb is generally less effective than lithium /depakote  but best option at this time.   Past Psychiatric History:  Schizoaffective disorder, Bipolar type, Generalized anxiety disorder.  Multiple inpatient hospitalizations. Inpatient at [facility] 08/23, [facility] 02/2022, [facility] 03/2022, [facility] 08/2022, 12/202, 09/2020.   Past Medical History:  Past Medical History:  Diagnosis Date   Bipolar affective disorder (HCC)    Bipolar disorder (HCC)    History of arthritis    History of chicken pox    History of depression    History of genital warts    history of heart murmur    History of high blood pressure    History of thyroid  disease    History of UTI    Hypertension    Low TSH level 07/13/2017   Schizophrenia (HCC)     Past Surgical History:  Procedure Laterality Date   ABLATION ON ENDOMETRIOSIS     CYST REMOVAL NECK     around 11 years ago /benign   MULTIPLE TOOTH EXTRACTIONS     Family History:  Family History  Problem Relation Age of Onset   Arthritis Father    Hyperlipidemia Father    High blood pressure Father    Diabetes Sister    Diabetes Mother    Diabetes Brother    Mental illness Brother    Alcohol  abuse Paternal Uncle    Alcohol  abuse Paternal Grandfather    Breast cancer Maternal Aunt    Breast cancer Paternal Aunt    High blood pressure Sister    Mental illness Other        runs in family   Family Psychiatric  History: Denies Social History:  Social History   Substance and Sexual Activity  Alcohol  Use Not Currently     Social History   Substance and Sexual Activity  Drug Use Not Currently    Social History   Socioeconomic History   Marital status: Single    Spouse name: Not on file   Number of children: Not on file   Years of education: Not on  file   Highest education level: Patient refused  Occupational History   Not on file  Tobacco Use   Smoking status: Never   Smokeless tobacco: Never  Vaping Use   Vaping status: Never Used  Substance and Sexual Activity   Alcohol  use: Not Currently   Drug use: Not Currently   Sexual activity: Not Currently  Other Topics Concern   Not on file  Social History Narrative   ** Merged History Encounter **       Social Drivers of Health   Financial Resource Strain: Not on file  Food Insecurity: Not on file  Transportation Needs: Not on file  Physical Activity: Not on file  Stress: Not on file  Social Connections: Not on file   Additional Social History:    Allergies:  No Known Allergies  Labs:  Results for orders placed or performed during the hospital encounter of 12/02/23 (from the past 48 hours)  Lithium  level     Status: Abnormal   Collection Time:  12/10/23  4:34 AM  Result Value Ref Range   Lithium  Lvl 0.29 (L) 0.60 - 1.20 mmol/L    Comment: Performed at [facility], [address] [place], [place] [address]  Comprehensive metabolic panel     Status: Abnormal   Collection Time: 12/10/23  4:34 AM  Result Value Ref Range   Sodium 139 135 - 145 mmol/L   Potassium 5.0 3.5 - 5.1 mmol/L   Chloride 104 98 - 111 mmol/L   CO2 21 (L) 22 - 32 mmol/L   Glucose, Bld 93 70 - 99 mg/dL    Comment: Glucose reference range applies only to samples taken after fasting for at least 8 hours.   BUN 24 (H) 6 - 20 mg/dL   Creatinine, Ser 8.77 (H) 0.44 - 1.00 mg/dL   Calcium 89.7 8.9 - 89.6 mg/dL   Total Protein 5.7 (L) 6.5 - 8.1 g/dL   Albumin 2.9 (L) 3.5 - 5.0 g/dL   AST 22 15 - 41 U/L    Comment: HEMOLYSIS AT THIS LEVEL MAY AFFECT RESULT   ALT 32 0 - 44 U/L    Comment: HEMOLYSIS AT THIS LEVEL MAY AFFECT RESULT   Alkaline Phosphatase 95 38 - 126 U/L   Total Bilirubin 0.7 0.0 - 1.2 mg/dL    Comment: HEMOLYSIS AT THIS LEVEL MAY AFFECT RESULT   GFR, Estimated 51 (L) >60 mL/min     Comment: (NOTE) Calculated using the CKD-EPI Creatinine Equation (2021)    Anion gap 14 5 - 15    Comment: Performed at [facility], [address] [place], [place] [address]  Ammonia     Status: Abnormal   Collection Time: 12/10/23  9:43 AM  Result Value Ref Range   Ammonia 46 (H) 9 - 35 umol/L    Comment: Performed at [facility], [address], [place], [place] [address]  Magnesium      Status: None   Collection Time: 12/10/23  9:43 AM  Result Value Ref Range   Magnesium  1.9 1.7 - 2.4 mg/dL    Comment: Performed at [facility] Lab, [address] [place], [place] [address]  Phosphorus     Status: None   Collection Time: 12/10/23  9:43 AM  Result Value Ref Range   Phosphorus 3.3 2.5 - 4.6 mg/dL    Comment: Performed at [facility], [address] [place], [place] [address]  CBC with Differential/Platelet     Status: None   Collection Time: 12/10/23  9:43 AM  Result Value Ref Range   WBC 7.3 4.0 - 10.5 K/uL   RBC 3.92 3.87 - 5.11 MIL/uL   Hemoglobin 12.2 12.0 - 15.0 g/dL   HCT 62.2 63.9 - 53.9 %   MCV 96.2 80.0 - 100.0 fL   MCH 31.1 26.0 - 34.0 pg   MCHC 32.4 30.0 - 36.0 g/dL   RDW 84.4 88.4 - 84.4 %   Platelets 221 150 - 400 K/uL   nRBC 0.0 0.0 - 0.2 %   Neutrophils Relative % 60 %   Neutro Abs 4.3 1.7 - 7.7 K/uL   Lymphocytes Relative 27 %   Lymphs Abs 2.0 0.7 - 4.0 K/uL   Monocytes Relative 10 %   Monocytes Absolute 0.7 0.1 - 1.0 K/uL   Eosinophils Relative 3 %   Eosinophils Absolute 0.3 0.0 - 0.5 K/uL   Basophils Relative 0 %   Basophils Absolute 0.0 0.0 - 0.1 K/uL  Immature Granulocytes 0 %   Abs Immature Granulocytes 0.03 0.00 - 0.07 K/uL    Comment: Performed at [facility], [address] [place], [place] [address]  Comprehensive metabolic panel     Status: Abnormal   Collection Time: 12/11/23  4:58 AM  Result Value Ref Range   Sodium 142 135 - 145 mmol/L   Potassium 5.8 (H) 3.5 - 5.1 mmol/L   Chloride 106 98 - 111  mmol/L   CO2 27 22 - 32 mmol/L   Glucose, Bld 113 (H) 70 - 99 mg/dL    Comment: Glucose reference range applies only to samples taken after fasting for at least 8 hours.   BUN 18 6 - 20 mg/dL   Creatinine, Ser 8.97 (H) 0.44 - 1.00 mg/dL   Calcium 89.0 (H) 8.9 - 10.3 mg/dL   Total Protein 6.9 6.5 - 8.1 g/dL   Albumin 3.4 (L) 3.5 - 5.0 g/dL   AST 16 15 - 41 U/L   ALT 28 0 - 44 U/L   Alkaline Phosphatase 98 38 - 126 U/L   Total Bilirubin 0.7 0.0 - 1.2 mg/dL   GFR, Estimated >39 >39 mL/min    Comment: (NOTE) Calculated using the CKD-EPI Creatinine Equation (2021)    Anion gap 9 5 - 15    Comment: Performed at [facility] Lab, [address] [place], [place] [address]  Ammonia     Status: Abnormal   Collection Time: 12/11/23  4:58 AM  Result Value Ref Range   Ammonia 37 (H) 9 - 35 umol/L    Comment: Performed at [facility] Lab, [address], [place], [place] [address]  Magnesium      Status: None   Collection Time: 12/11/23  4:58 AM  Result Value Ref Range   Magnesium  2.0 1.7 - 2.4 mg/dL    Comment: Performed at [facility], [address], [place], [place] [address]    Current Facility-Administered Medications  Medication Dose Route Frequency Provider Last Rate Last Admin   acetaminophen  (TYLENOL ) tablet 650 mg  650 mg Oral Q6H PRN Lonzell Emeline HERO, DO       Or   acetaminophen  (TYLENOL ) suppository 650 mg  650 mg Rectal Q6H PRN Lonzell Emeline HERO, DO       calcitonin (MIACALCIN ) injection 238 Units  4 Units/kg Intramuscular BID Singh, Prashant K, MD   238 Units at 12/11/23 1236   feeding supplement (ENSURE ENLIVE / ENSURE PLUS) liquid 237 mL  237 mL Oral BID BM Hall, Carole N, DO   237 mL at 12/11/23 1459   heparin  injection 5,000 Units  5,000 Units Subcutaneous Q8H Samtani, Jai-Gurmukh, MD   5,000 Units at 12/11/23 1317   hydrALAZINE  (APRESOLINE ) injection 10 mg  10 mg Intravenous Q6H PRN Raenelle Donalda HERO, MD   10 mg at 12/06/23 2004   lactulose  (CHRONULAC ) 10  GM/15ML solution 30 g  30 g Oral BID Singh, Prashant K, MD   30 g at 12/11/23 9074   metoprolol  tartrate (LOPRESSOR ) injection 5 mg  5 mg Intravenous Q6H PRN Gardner, Jared M, DO       ondansetron  (ZOFRAN ) tablet 4 mg  4 mg Oral Q6H PRN Lonzell Emeline HERO, DO       Or   ondansetron  (ZOFRAN ) injection 4 mg  4 mg Intravenous Q6H PRN Lonzell Emeline HERO, DO       Oral care mouth rinse  15 mL Mouth Rinse 4 times per day  Dennise Lavada POUR, MD   15 mL at 12/11/23 1500   Oral care mouth rinse  15 mL Mouth Rinse PRN Dennise Lavada POUR, MD       Oxcarbazepine  (TRILEPTAL ) tablet 300 mg  300 mg Oral QHS Kemya Shed A       sodium zirconium cyclosilicate  (LOKELMA ) packet 10 g  10 g Oral BID Singh, Prashant K, MD   10 g at 12/11/23 9073    Musculoskeletal: Strength & Muscle Tone: within normal limits Gait & Station: normal Patient leans: N/A  Psychiatric Specialty Exam:  Presentation  General Appearance: Appropriate for Environment; Casual   Eye Contact:Good   Speech:Clear and Coherent; Normal Rate   Speech Volume: normal  Handedness:Right    Mood and Affect  Mood:-- (Pretty good today)   Affect:Congruent; Appropriate    Thought Process  Thought Processes:Coherent; Linear (minimal spontaneous thought)   Descriptions of Associations:Intact   Orientation:Full (Time, Place and Person)   Thought Content:Logical   History of Schizophrenia/Schizoaffective disorder: yes  Duration of Psychotic Symptoms: > 6 months  Hallucinations:Denies   Ideas of Reference: none  Suicidal Thoughts:Denies   Homicidal Thoughts:Denies    Sensorium  Memory:Immediate Good; Recent Fair; Remote Fair   Judgment:Fair   Insight:Fair    Executive Functions  Concentration:Fair   Attention Span:Fair   Recall:Fair   Fund of Knowledge:Fair   Language:Fair    Psychomotor Activity  Psychomotor Activity:Psychomotor Activity: Normal     Assets  Assets:Financial  Resources/Insurance; Housing   Sleep  Sleep:No data recorded   Physical Exam: Physical Exam Vitals and nursing note reviewed.  Constitutional:      Appearance: Normal appearance. She is normal weight.  Eyes:     Pupils: Pupils are equal, round, and reactive to light.  Skin:    General: Skin is warm.     Capillary Refill: Capillary refill takes less than 2 seconds.  Neurological:     General: No focal deficit present.     Mental Status: She is alert and oriented to person, place, and time. Mental status is at baseline.  Psychiatric:        Mood and Affect: Mood normal.        Behavior: Behavior normal.        Thought Content: Thought content normal.        Judgment: Judgment normal.    Review of Systems  Psychiatric/Behavioral:  The patient is not nervous/anxious.   All other systems reviewed and are negative.  Blood pressure (!) 157/98, pulse 81, temperature 98 F (36.7 C), temperature source Oral, resp. rate 11, height 5' 1 (1.549 m), weight 59.7 kg, SpO2 97%. Body mass index is 24.87 kg/m.  Treatment Plan Summary: Daily contact with patient to assess and evaluate symptoms and progress in treatment, Medication management, and Plan   -patient with symptoms of lithium  toxicity on initial exam, ordered stat lithium  level repeat - Continue to hold lithium . , Level 0.60. Patient no longer an ideal candidate for Lithium  - could be restarted in  subspecialized clinic/in conjunction with nephro (ie outpatient) AFTER failure of alternate mood stabilizers.  - unfortunately elevated baseline ammonia, depakote  no-go - starting oxcarbazepine  300 at bedtime would titrate up if well tolerated -- was on zyprexa  25 at bedtime likely restart next couple of days not sure why not restarted   -Will benefit from PT/OT consult while in the hospital. Increased falls at homes, ataxia, and deconditioning.   -TOC referral for SNF to help with reconditioning and strength  training.   Labs  reviewed: Including abnormal sodium of 138,  elevated creatinine 1.29,  calcium 10.3, elevated lithium  level of 1.28--> .58, ordered repeat Level VPA level < 10 LFT's wnl  Disposition: No evidence of imminent risk to self or others at present.   Patient does not meet criteria for psychiatric inpatient admission. Supportive therapy provided about ongoing stressors. Discussed crisis plan, support from social network, calling 911, coming to the Emergency Department, and calling Suicide Hotline.  Rollene A Sarah Baez 12/11/2023 5:10 PM

## 2023-12-11 NOTE — TOC Progression Note (Addendum)
 Transition of Care [facility]) - Progression Note    Patient Details  Name: Paula Dickson MRN: [id number] Date of Birth: Mar 05, 1966  Transition of Care [facility]) CM/SW Contact  Inocente GORMAN Kindle, LCSW Phone Number: 12/11/2023, 3:10 PM  Clinical Narrative:    3pm-CSW provided update to [facility] with plan to admit on 1/16. Will begin insurance process once updated therapy note available.   4:25 PM-Patient not managed by Navi system. CSW requested [facility] start insurance process.   Expected Discharge Plan: Skilled Nursing Facility Barriers to Discharge: Continued Medical Work up, English As A Second Language Teacher, SNF Pending bed offer, SNF Covid  Expected Discharge Plan and Services In-house Referral: Clinical Social Work   Post Acute Care Choice: Skilled Nursing Facility Living arrangements for the past 2 months: Single Family Home                                       Social Determinants of Health (SDOH) Interventions SDOH Screenings   Alcohol  Screen: Low Risk  (11/23/2021)  Tobacco Use: Low Risk  (12/02/2023)    Readmission Risk Interventions     No data to display

## 2023-12-12 DIAGNOSIS — G9341 Metabolic encephalopathy: Secondary | ICD-10-CM | POA: Diagnosis not present

## 2023-12-12 LAB — CBC WITH DIFFERENTIAL/PLATELET
Abs Immature Granulocytes: 0.04 10*3/uL (ref 0.00–0.07)
Basophils Absolute: 0 10*3/uL (ref 0.0–0.1)
Basophils Relative: 0 %
Eosinophils Absolute: 0 10*3/uL (ref 0.0–0.5)
Eosinophils Relative: 0 %
HCT: 37.3 % (ref 36.0–46.0)
Hemoglobin: 12.1 g/dL (ref 12.0–15.0)
Immature Granulocytes: 0 %
Lymphocytes Relative: 15 %
Lymphs Abs: 1.8 10*3/uL (ref 0.7–4.0)
MCH: 31 pg (ref 26.0–34.0)
MCHC: 32.4 g/dL (ref 30.0–36.0)
MCV: 95.6 fL (ref 80.0–100.0)
Monocytes Absolute: 1.3 10*3/uL — ABNORMAL HIGH (ref 0.1–1.0)
Monocytes Relative: 10 %
Neutro Abs: 9.2 10*3/uL — ABNORMAL HIGH (ref 1.7–7.7)
Neutrophils Relative %: 75 %
Platelets: 383 10*3/uL (ref 150–400)
RBC: 3.9 MIL/uL (ref 3.87–5.11)
RDW: 15.2 % (ref 11.5–15.5)
WBC: 12.4 10*3/uL — ABNORMAL HIGH (ref 4.0–10.5)
nRBC: 0 % (ref 0.0–0.2)

## 2023-12-12 LAB — COMPREHENSIVE METABOLIC PANEL
ALT: 24 U/L (ref 0–44)
AST: 13 U/L — ABNORMAL LOW (ref 15–41)
Albumin: 3.3 g/dL — ABNORMAL LOW (ref 3.5–5.0)
Alkaline Phosphatase: 102 U/L (ref 38–126)
Anion gap: 10 (ref 5–15)
BUN: 18 mg/dL (ref 6–20)
CO2: 28 mmol/L (ref 22–32)
Calcium: 9.9 mg/dL (ref 8.9–10.3)
Chloride: 101 mmol/L (ref 98–111)
Creatinine, Ser: 1.01 mg/dL — ABNORMAL HIGH (ref 0.44–1.00)
GFR, Estimated: >60 mL/min (ref >60)
Glucose, Bld: 120 mg/dL — ABNORMAL HIGH (ref 70–99)
Potassium: 4.3 mmol/L (ref 3.5–5.1)
Sodium: 139 mmol/L (ref 135–145)
Total Bilirubin: 0.8 mg/dL (ref 0.0–1.2)
Total Protein: 6.9 g/dL (ref 6.5–8.1)

## 2023-12-12 LAB — ANGIOTENSIN CONVERTING ENZYME: Angiotensin-Converting Enzyme: 26 U/L (ref 14–82)

## 2023-12-12 LAB — MAGNESIUM: Magnesium: 2.1 mg/dL (ref 1.7–2.4)

## 2023-12-12 LAB — AMMONIA: Ammonia: 25 umol/L (ref 9–35)

## 2023-12-12 MED ORDER — OLANZAPINE 5 MG PO TABS
5.0000 mg | ORAL_TABLET | Freq: Every day | ORAL | Status: DC
  Administered 2023-12-12 – 2023-12-13 (×2): 5 mg via ORAL
  Filled 2023-12-12 (×2): qty 1

## 2023-12-12 MED ORDER — PROPRANOLOL HCL 10 MG PO TABS
10.0000 mg | ORAL_TABLET | Freq: Two times a day (BID) | ORAL | Status: DC
  Administered 2023-12-12 – 2023-12-14 (×5): 10 mg via ORAL
  Filled 2023-12-12 (×5): qty 1

## 2023-12-12 MED ORDER — AMLODIPINE BESYLATE 10 MG PO TABS
10.0000 mg | ORAL_TABLET | Freq: Every day | ORAL | Status: DC
  Administered 2023-12-12 – 2023-12-14 (×3): 10 mg via ORAL
  Filled 2023-12-12 (×3): qty 1

## 2023-12-12 MED ORDER — OLANZAPINE 5 MG PO TABS: 10.0000 mg | ORAL_TABLET | Freq: Every day | ORAL | Status: DC

## 2023-12-12 NOTE — Plan of Care (Signed)
  Problem: Education: Goal: Knowledge of risk factors and measures for prevention of condition will improve Outcome: Progressing   Problem: Coping: Goal: Psychosocial and spiritual needs will be supported Outcome: Progressing   Problem: Respiratory: Goal: Will maintain a patent airway Outcome: Progressing Goal: Complications related to the disease process, condition or treatment will be avoided or minimized Outcome: Progressing   Problem: Education: Goal: Knowledge of General Education information will improve Description: Including pain rating scale, medication(s)/side effects and non-pharmacologic comfort measures Outcome: Progressing   Problem: Activity: Goal: Risk for activity intolerance will decrease Outcome: Progressing   Problem: Clinical Measurements: Goal: Ability to maintain clinical measurements within normal limits will improve Outcome: Progressing Goal: Will remain free from infection Outcome: Progressing Goal: Diagnostic test results will improve Outcome: Progressing Goal: Respiratory complications will improve Outcome: Progressing Goal: Cardiovascular complication will be avoided Outcome: Progressing

## 2023-12-12 NOTE — Progress Notes (Signed)
 Occupational Therapy Treatment Patient Details Name: Paula Dickson MRN: [id number] DOB: 08/03/1966 Today's Date: 12/12/2023   History of present illness 58 y.o.  female admitted 12/02/23 with altered mental status-found to have COVID-19 infection. PMH significant for tremors, acute metabolic encephalopathy, asipiration PNA, hypernatremia, hypercalcemia, AKI on CKD 2, PAF, hyperthyroidism, BPD, debility, schizophrenia   OT comments  Pt feeling better than initial eval, improved significantly since admission. Pt able to perform BUE tasks with set up/min A for feeding, grooming, frequent cueing for initiation of tasks. Pt able to respond appropriately, conversational, still has trouble with following simple commands ~50%, decreased sequencing and safety awareness. Pt mod A x2 for transfer to chair, not able to ambulate other than turning to chair/BSC. Pt would benefit from continued skilled OT to maximize participation, DC to postacute still appropriate.       If plan is discharge home, recommend the following:  Two people to help with walking and/or transfers;Two people to help with bathing/dressing/bathroom;Assistance with cooking/housework;Direct supervision/assist for medications management;Help with stairs or ramp for entrance;Assist for transportation   Equipment Recommendations  Other (comment)    Recommendations for Other Services      Precautions / Restrictions Precautions Precautions: Fall Restrictions Weight Bearing Restrictions Per Provider Order: No       Mobility Bed Mobility Overal bed mobility: Needs Assistance Bed Mobility: Supine to Sit     Supine to sit: Mod assist, HOB elevated, Used rails     General bed mobility comments: mod A for supine to sit, HHA and scooting to edge, difficulty sitting EOB, L lateral lean    Transfers Overall transfer level: Needs assistance Equipment used: 2 person hand held assist Transfers: Bed to chair/wheelchair/BSC Sit to  Stand: Mod assist, +2 physical assistance     Step pivot transfers: Mod assist, +2 physical assistance     General transfer comment: mod A x2 for STS and step pivot transfer, quick to sit down, poor safety awareness     Balance Overall balance assessment: Needs assistance Sitting-balance support: No upper extremity supported, Feet supported Sitting balance-Leahy Scale: Poor Sitting balance - Comments: Pt requires frequent cues and intermittent assist to maintain upright posture due to L lateral lean at EOB and in recliner Postural control: Left lateral lean Standing balance support: Bilateral upper extremity supported, During functional activity, Reliant on assistive device for balance Standing balance-Leahy Scale: Poor Standing balance comment: reliant on x2 HHA for standing                           ADL either performed or assessed with clinical judgement   ADL Overall ADL's : Needs assistance/impaired Eating/Feeding: Set up;Minimal assistance   Grooming: Set up;Minimal assistance   Upper Body Bathing: Moderate assistance   Lower Body Bathing: Total assistance   Upper Body Dressing : Moderate assistance   Lower Body Dressing: Total assistance   Toilet Transfer: Moderate assistance;+2 for safety/equipment   Toileting- Clothing Manipulation and Hygiene: Total assistance       Functional mobility during ADLs: Moderate assistance;+2 for safety/equipment;Rolling walker (2 wheels) General ADL Comments: improving with UB ADLs, generalized weakness, decreased task initiation without frequent cueing. Pt total for LB ADLs, mod A x2 for transfers to [facility]    Extremity/Trunk Assessment Upper Extremity Assessment Upper Extremity Assessment: Generalized weakness RUE Deficits / Details: improving, able to perform UB ADLs with set up or min A, increased cueing. RUE Coordination: decreased fine motor LUE Deficits / Details:  improving, able to perform UB ADLs with set up or  min A, increased cueing. LUE Coordination: decreased fine motor            Vision       Perception     Praxis      Cognition Arousal: Alert Behavior During Therapy: Flat affect Overall Cognitive Status: Impaired/Different from baseline                                 General Comments: Pt doing better than initial eval, able to respond appropriately to questions asked, some difficulty following simple commands for ADLs or mobility ~50% of time.        Exercises      Shoulder Instructions       General Comments      Pertinent Vitals/ Pain       Pain Assessment Pain Assessment: No/denies pain  Home Living                                          Prior Functioning/Environment              Frequency  Min 1X/week        Progress Toward Goals  OT Goals(current goals can now be found in the care plan section)  Progress towards OT goals: Progressing toward goals  Acute Rehab OT Goals Patient Stated Goal: to improve with functional independence OT Goal Formulation: With patient Time For Goal Achievement: 12/19/23 Potential to Achieve Goals: Fair ADL Goals Pt Will Perform Eating: with min assist Pt Will Perform Grooming: with min assist Pt Will Perform Upper Body Dressing: with min assist Pt Will Perform Lower Body Dressing: with min assist Pt Will Transfer to Toilet: with min assist;stand pivot transfer;bedside commode Pt Will Perform Toileting - Clothing Manipulation and hygiene: with min assist  Plan      Co-evaluation                 AM-PAC OT "6 Clicks" Daily Activity     Outcome Measure   Help from another person eating meals?: A Little Help from another person taking care of personal grooming?: A Little Help from another person toileting, which includes using toliet, bedpan, or urinal?: A Lot Help from another person bathing (including washing, rinsing, drying)?: A Lot Help from another person to put  on and taking off regular upper body clothing?: A Lot Help from another person to put on and taking off regular lower body clothing?: Total 6 Click Score: 13    End of Session Equipment Utilized During Treatment: Gait belt;Rolling walker (2 wheels)  OT Visit Diagnosis: Unsteadiness on feet (R26.81);Other abnormalities of gait and mobility (R26.89);Muscle weakness (generalized) (M62.81);Other symptoms and signs involving cognitive function   Activity Tolerance Patient tolerated treatment well   Patient Left in chair;with call bell/phone within reach;with chair alarm set   Nurse Communication Mobility status        Time: 1420-1435 OT Time Calculation (min): 15 min  Charges: OT General Charges $OT Visit: 1 Visit OT Treatments $Therapeutic Activity: 8-22 mins  [address] OTR/L   Scherry Curtis 12/12/2023, 3:12 PM

## 2023-12-12 NOTE — NC FL2 (Signed)
[place] MEDICAID FL2 LEVEL OF CARE FORM     IDENTIFICATION  Patient Name: Paula Dickson Birthdate: 1966-04-03 Sex: female Admission Date (Current Location): 12/02/2023  [facility] and [place] Number:  Producer, television/film/video and Address:  [facility], 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) 11/27/2021   Malnutrition of moderate degree 03/26/2021   Encounter for intubation    Encephalopathy acute    Respiratory failure (HCC)    Schizophrenia (HCC)    Overdose 03/18/2021   Paroxysmal atrial fibrillation (HCC)    Hypernatremia 02/03/2021   AKI (acute kidney injury) (HCC) 02/03/2021   Schizoaffective disorder, bipolar type (HCC) 02/08/2020   Acute metabolic encephalopathy 11/17/2019   AMS (altered mental status) 11/17/2019   Lithium toxicity 11/17/2019   Hyperthyroidism 10/14/2019   Medication side effect, initial encounter    Schizoaffective disorder (HCC) 03/13/2019   Severe mixed bipolar 1 disorder without psychosis (HCC) 03/13/2019   Bipolar affective disorder, current episode mixed (HCC) 11/01/2018   Toxic encephalopathy 07/14/2017   Suicide attempt (HCC) 07/13/2017   Low TSH level 07/13/2017   Overdose of psychotropic 07/12/2017   Valproic acid toxicity 07/12/2017   Hypercalcemia 07/12/2017   Hyperammonemia (HCC) 07/12/2017   Schizophrenia (HCC) 07/12/2017   Bipolar disorder, most recent episode depressed  (HCC) 07/12/2017   Benign essential HTN 02/21/2017   Hyperlipidemia, mixed 02/21/2017   Insomnia 12/27/2015   Abnormal urinalysis 12/27/2015   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   Severe manic bipolar 1 disorder with psychotic behavior (HCC) 06/14/2015    Orientation RESPIRATION BLADDER Height & Weight     Self, Time, Situation, Place  Normal Incontinent, External catheter Weight: 131 lb 9.8 oz (59.7 kg) Height:  5\' 1"  (154.9 cm)  BEHAVIORAL SYMPTOMS/MOOD NEUROLOGICAL BOWEL NUTRITION STATUS      Incontinent Diet (See dc summary)  AMBULATORY STATUS COMMUNICATION OF NEEDS Skin   Extensive Assist Verbally Normal                       Personal Care Assistance Level of Assistance  Bathing, Feeding, Dressing Bathing Assistance: Maximum assistance Feeding assistance: Limited assistance Dressing Assistance: Limited assistance     Functional Limitations Info             SPECIAL CARE FACTORS FREQUENCY  PT (By licensed PT), OT (By licensed OT)     PT Frequency: 5x/week OT Frequency: 5x/week            Contractures Contractures Info: Not present    Additional Factors Info  Code Status, Allergies, Isolation Precautions Code Status Info: Full Allergies Info: NKA     Isolation Precautions Info: COVID+ on 12/02/23     Current Medications (12/12/2023):  This is the current hospital active medication list Current Facility-Administered Medications  Medication Dose Route Frequency Provider Last Rate Last Admin   acetaminophen (TYLENOL) tablet 650 mg  650 mg Oral Q6H PRN Hillary Bow, DO  Or   acetaminophen (TYLENOL) suppository 650 mg  650 mg Rectal Q6H PRN Hillary Bow, DO       amLODipine (NORVASC) tablet 10 mg  10 mg Oral Daily Leroy Sea, MD   10 mg at 12/12/23 0647   feeding supplement (ENSURE ENLIVE / ENSURE PLUS) liquid 237 mL  237 mL Oral BID BM Hall, Carole N, DO   237 mL at 12/12/23 1403   heparin injection 5,000 Units  5,000 Units  Subcutaneous Q8H Rhetta Mura, MD   5,000 Units at 12/12/23 1403   hydrALAZINE (APRESOLINE) injection 10 mg  10 mg Intravenous Q6H PRN Maretta Bees, MD   10 mg at 12/06/23 2004   lactulose (CHRONULAC) 10 GM/15ML solution 30 g  30 g Oral BID Leroy Sea, MD   30 g at 12/12/23 1610   metoprolol tartrate (LOPRESSOR) injection 5 mg  5 mg Intravenous Q6H PRN Hillary Bow, DO       OLANZapine (ZYPREXA) tablet 5 mg  5 mg Oral QHS Peterson Ao, MD       ondansetron [facility]) tablet 4 mg  4 mg Oral Q6H PRN Hillary Bow, DO       Or   ondansetron [facility]) injection 4 mg  4 mg Intravenous Q6H PRN Hillary Bow, DO       Oral care mouth rinse  15 mL Mouth Rinse 4 times per day Leroy Sea, MD   15 mL at 12/12/23 1403   Oral care mouth rinse  15 mL Mouth Rinse PRN Leroy Sea, MD       Oxcarbazepine (TRILEPTAL) tablet 300 mg  300 mg Oral QHS Cinderella, Margaret A   300 mg at 12/11/23 2125   propranolol (INDERAL) tablet 10 mg  10 mg Oral BID Leroy Sea, MD   10 mg at 12/12/23 9604     Discharge Medications: Please see discharge summary for a list of discharge medications.  Relevant Imaging Results:  Relevant Lab Results:   Additional Information ss#[id number].  Mearl Latin, LCSW

## 2023-12-12 NOTE — Progress Notes (Signed)
 PROGRESS NOTE        PATIENT DETAILS Name: Paula Dickson Age: 58 y.o. Sex: female Date of Birth: [birth year] Admit Date: 12/02/2023 Admitting Physician Dea Evert, DO ZOX:[id number]  Brief Summary: Patient is a 58 y.o.  female with history of bipolar disorder, hyperthyroidism-please continue with altered mental status-found to have COVID-19 infection.  Significant events: 1/5>> admit to [facility]  Significant studies: 1/5>> CXR: Left basilar airspace disease. 1/5>> MRI brain: No acute intracranial abnormality 1/5>> NH4:<10 1/5>> TSH: Within normal limit 1/6>> EEG: No seizures 1/6>> UDS: Negative 1/6>> CSF: WBC 1, protein 48 1/7>> echo: EF 55-60%  Significant microbiology data: 1/5>> COVID PCR: Positive 1/5>> influenza/RSV PCR: Negative 1/6>> blood culture: Negative 1/6>> CSF culture: Negative 1/6>> CSF biofire panel: Negative  Procedures: 1/6>> fluoroscopy-guided LP  Consults: Neurology Psychiatry  Subjective: Patient in bed, appears comfortable, denies any headache, no fever, no chest pain or pressure, no shortness of breath , no abdominal pain. No new focal weakness.  Objective: Vitals: Blood pressure (!) 168/120, pulse 89, temperature 98.5 F (36.9 C), temperature source Oral, resp. rate 12, height 5\' 1"  (1.549 m), weight 59.7 kg, SpO2 97%.   Exam:  Awake Alert, No new F.N deficits, bilateral upper extremity tremors which are chronic [place].AT,PERRAL Supple Neck, No JVD,   Symmetrical Chest wall movement, Good air movement bilaterally, CTAB RRR,No Gallops, Rubs or new Murmurs,  +ve B.Sounds, Abd Soft, No tenderness,   No Cyanosis, Clubbing or edema   Assessment/Plan:  Acute metabolic encephalopathy Etiology unclear but suspect multifactorial-from COVID-19 infection-mild elevation in lithium  levels-mild hypernatremia-AKI all playing a role. Neuro imaging/CSF analysis/Spot EEG all negative Significantly better-completely awake  and alert and back to baseline Appreciate neurology input.  Tremors Chronic in nature discussed with patient and sister present for multiple years Unclear significance, ammonia mildly elevated and trending down  COVID-19 infection On room air-minimal infiltrate seen on x-ray on left base-possible aspiration rather than from COVID. Supportive care at this point  ?  Aspiration pneumonia Small infiltrate on left lung base-could have aspirated due to encephalopathy Rocephin  x 5 days total completed on 1/10-remains clinically stable Evaluated by SLP-placed on regular diet on 1/8-will follow closely.  Hyperkalemia.  Treated, monitor.  Hypercalcemia Mild, PTH levels mildly elevated in the clinical scenario, 1,25 vitamin D  levels are low, calcitriol  levels are stable, hypercalcemia could be due to dehydration and reduced by high levels of lithium , corrected calcium level still elevated, continue to hydrate, calcitonin initiated on 12/09/2023, dose increased further on 12/11/2023, IV fluids and Lasix  also given on 12/11/2023, calcium improved on 12/12/2023.  Will require outpatient monitoring of CMP and outpatient endocrine follow-up in 1 to 2 weeks postdischarge.  AKI on CKD 2 AKI likely hemodynamically mediated Slowly improving with supportive care Follow electrolytes closely.  PAF EKG on admit consistent with A-fib Has been sinus rhythm on telemetry since admit TSH stable With stable EF Possible that A-fib triggered by COVID-19 infection-appears brief-not sure if she is a good long-term candidate for anticoagulation- given psych issues-?  Compliance For now-monitor off anticoagulation  History of hyperthyroidism Does not appear to be on Tapazole  any longer Not sure if she is on propranolol -on home med list TSH within normal limits Stable for outpatient monitoring  Bipolar disorder Initially both Depakote /lithium  were held-once patient improved-psychiatry evaluation was  requested-Depakote  was restarted-however on. Psych managing her medications  currently off of lithium  and Depakote .  Now placed on Trileptal /Oxcarbazepine  by psychiatry.  Debility/deconditioning Spoke with Sister-[name]-1/7-mostly bedbound-able to take a few steps to go to the bathroom/kitchen-but basically stopped walking long distance last week of November.  Apparently she is afraid of falling.  For the past several days-mostly bedbound-with poor oral intake.  No sick contacts. PT/OT/SLP eval-SNF commended.  BMI: Estimated body mass index is 24.87 kg/m as calculated from the following:   Height as of this encounter: 5\' 1"  (1.549 m).   Weight as of this encounter: 59.7 kg.   Code status:   Code Status: Full Code   DVT Prophylaxis: heparin  injection 5,000 Units Start: 12/04/23 0000   Family Communication: Sister-[name]-[phone] updated over the phone in detail on 12/10/2023   Disposition Plan: Status is: Inpatient Remains inpatient appropriate because: Severity of illness   Planned Discharge Destination: SNF   Diet: Diet Order             Diet regular Fluid consistency: Thin  Diet effective now                    MEDICATIONS: Scheduled Meds:  amLODipine   10 mg Oral Daily   feeding supplement  237 mL Oral BID BM   heparin  injection (subcutaneous)  5,000 Units Subcutaneous Q8H   lactulose   30 g Oral BID   mouth rinse  15 mL Mouth Rinse 4 times per day   OXcarbazepine   300 mg Oral QHS   propranolol   10 mg Oral BID   Continuous Infusions:     PRN Meds:.acetaminophen  **OR** acetaminophen , hydrALAZINE , metoprolol  tartrate, ondansetron  **OR** ondansetron  (ZOFRAN ) IV, mouth rinse   I have personally reviewed following labs and imaging studies  LABORATORY DATA: Recent Labs  Lab 12/07/23 0543 12/08/23 0542 12/09/23 0550 12/10/23 0943 12/12/23 0513  WBC 6.3 5.3 6.6 7.3 12.4*  HGB 11.8* 11.5* 12.0 12.2 12.1  HCT 37.3 35.8* 37.4 37.7 37.3  PLT 143* 141* 166  221 383  MCV 97.9 96.0 96.9 96.2 95.6  MCH 31.0 30.8 31.1 31.1 31.0  MCHC 31.6 32.1 32.1 32.4 32.4  RDW 16.1* 16.1* 15.7* 15.5 15.2  LYMPHSABS 2.7 2.2 2.3 2.0 1.8  MONOABS 0.6 0.7 0.8 0.7 1.3*  EOSABS 0.3 0.2 0.3 0.3 0.0  BASOSABS 0.0 0.0 0.0 0.0 0.0    Recent Labs  Lab 12/08/23 0542 12/09/23 0550 12/10/23 0434 12/10/23 0943 12/11/23 0458 12/12/23 0513  NA 142 138 139  --  142 139  K 4.5 4.2 5.0  --  5.8* 4.3  CL 109 105 104  --  106 101  CO2 25 25 21*  --  27 28  ANIONGAP 8 8 14   --  9 10  GLUCOSE 98 139* 93  --  113* 120*  BUN 26* 24* 24*  --  18 18  CREATININE 1.16* 1.29* 1.22*  --  1.02* 1.01*  AST  --  34 22  --  16 13*  ALT  --  37 32  --  28 24  ALKPHOS  --  99 95  --  98 102  BILITOT  --  0.4 0.7  --  0.7 0.8  ALBUMIN  --  2.9* 2.9*  --  3.4* 3.3*  AMMONIA  --   --   --  46* 37* 25  BNP  --  7.3  --   --   --   --   MG  --  1.9  --  1.9 2.0 2.1  CALCIUM 10.4* 10.3 10.2  --  10.9* 9.9     Recent Labs  Lab 12/08/23 0542 12/09/23 0550 12/10/23 0434 12/10/23 0943 12/11/23 0458 12/12/23 0513  AMMONIA  --   --   --  46* 37* 25  BNP  --  7.3  --   --   --   --   MG  --  1.9  --  1.9 2.0 2.1  CALCIUM 10.4* 10.3 10.2  --  10.9* 9.9      RADIOLOGY STUDIES/RESULTS: No results found.    LOS: 9 days  Signature  -    Lynnwood Sauer M.D on 12/12/2023 at 8:43 AM   -  To page go to [email or web address]

## 2023-12-12 NOTE — Consult Note (Signed)
 [facility] Face-to-Face Psychiatry Consult   Reason for Consult:  Significant psych history on lithium /Depakote /Zyprexa -came in with confusion-lithium  levels are mildly elevated-had hypercalcemia/hypernatremia-she has improved with supportive care-discussed with neurology-wondering if you all could see her and see if we should resume her lithium  and other medications.  Not sure if the electrolyte changes-mental status was from lithium  toxicity-although levels only minimally elevated.  Referring Physician:  Dr. Hilton Lucky  Patient Identification: Paula Dickson MRN:  [id number] Principal Diagnosis: Acute metabolic encephalopathy Diagnosis:  Principal Problem:   Acute metabolic encephalopathy Active Problems:   Hypercalcemia   Hyperthyroidism   Schizoaffective disorder, bipolar type (HCC)   AKI (acute kidney injury) (HCC)   Paroxysmal atrial fibrillation (HCC)   COVID-19 virus infection   Total Time spent with patient: 30 minutes    Subjective:   Paula Dickson is a 58 y.o. female patient with a history of schizoaffective disorder-bipolar type who presents to [facility] due to confucian and lethargy behavior. Psych consult placed for  psych history on Lithium /Zyprexa  and Depakote . Came in with confusion. Lithium  levels are mildly elevated.   1/12: Spoke with nurse who states she had to feed her as patient unable to feed herself due to tremor. I spoke with patient's sister who states that she has had new onset tremor the past 3 months which she did not have before; is not aware of any lithium  levels during this time but notes patient became more confused and less responsive 2 weeks ago and bedbound. I discussed r/b/a of lithium  and due to continued concerns for lithium  toxicity plan to stop it and switch to Depakote  or Trileptal . I ordered a stat level.  Patient seen at bedside and with severe hand-flapping tremor, completed finger to nose neuro exam and patient is ataxic. She is alert but does  not remember who brought her to the hospital or why she is here. Patient is oriented to self and location, month but not date or year stating it is 2024. States she has had difficulty walking and had falls at one point but does not remember when. Patient denies wanting to be dead or feeling hopeless. She denies any suicidal or homicidal ideation, as well as any auditory or visual hallucinations.  1/13: Patient seen and reassessed today. She is observed waving at this provider though her window. She presents with a bright affect and smiling. She engages in conversation, asking about the providers weekend. She reports her sister visited. She is able to recall the changes made to her lithium  by the previous provider. Patient did display some periods of memory fogginess however she remained appropriate. She reports that she is in agreeable with the change from Lithium  to Depakote  citing an increase in falls at home and increase in tremors. She endorses eating and sleeping well. Remains interested in physical therapy during this stay and transitioning to rehab. Patient denies any suicidal ideations, homicidal ideations and or hallucinations at this time. She reports compliance with her medications and denies any additional side effects.   1/14: Minimal change from what was described by Dr. Zouev. She remembers seh was supposed to go onto Depakote  and was surprised this was not started; agreed with rationale once explained. She is agreeable to starting oxcarb but wanted her sister to be informed of all med changes. Feels that her tremors are a little better now than before and shared she had had them for years, but worsened shortly before admission. No SI, HI, AH/VH. Adequate short term memory (able to describe  PT treatment, etc w/ no issues).  Called sister and explained med changes. Let her know about main s/e (hyponatremia ie "low salt") as well as what to do (get blood test to test for salt if confusion). She  affirmed my s/o that there is no current concern for psychiatric deterioration, mostly concern that she will leave on ineffective med regimen d/t physical complaints. Did let her know oxcarb is generally less effective than lithium /depakote  but best option at this time.   1/15: Saw pt very briefly (personal hygiene was being attended to). No major s/e from trileptal . No SI, HI, AH/VH. Amenable to restart zyprexa  at lower dose. Called sister who was amenable to same.   Past Psychiatric History:  Schizoaffective disorder, Bipolar type, Generalized anxiety disorder.  Multiple inpatient hospitalizations. Inpatient at [facility] 08/23, [facility] 02/2022, [facility] 03/2022, [facility] 08/2022, 12/202, 09/2020.   Past Medical History:  Past Medical History:  Diagnosis Date   Bipolar affective disorder (HCC)    Bipolar disorder (HCC)    History of arthritis    History of chicken pox    History of depression    History of genital warts    history of heart murmur    History of high blood pressure    History of thyroid  disease    History of UTI    Hypertension    Low TSH level 07/13/2017   Schizophrenia (HCC)     Past Surgical History:  Procedure Laterality Date   ABLATION ON ENDOMETRIOSIS     CYST REMOVAL NECK     around 11 years ago /benign   MULTIPLE TOOTH EXTRACTIONS     Family History:  Family History  Problem Relation Age of Onset   Arthritis Father    Hyperlipidemia Father    High blood pressure Father    Diabetes Sister    Diabetes Mother    Diabetes Brother    Mental illness Brother    Alcohol  abuse Paternal Uncle    Alcohol  abuse Paternal Grandfather    Breast cancer Maternal Aunt    Breast cancer Paternal Aunt    High blood pressure Sister    Mental illness Other        runs in family   Family Psychiatric  History: Denies Social History:  Social History   Substance and Sexual Activity  Alcohol  Use Not Currently     Social History   Substance and Sexual  Activity  Drug Use Not Currently    Social History   Socioeconomic History   Marital status: Single    Spouse name: Not on file   Number of children: Not on file   Years of education: Not on file   Highest education level: Patient refused  Occupational History   Not on file  Tobacco Use   Smoking status: Never   Smokeless tobacco: Never  Vaping Use   Vaping status: Never Used  Substance and Sexual Activity   Alcohol  use: Not Currently   Drug use: Not Currently   Sexual activity: Not Currently  Other Topics Concern   Not on file  Social History Narrative   ** Merged History Encounter **       Social Drivers of Health   Financial Resource Strain: Not on file  Food Insecurity: Not on file  Transportation Needs: Not on file  Physical Activity: Not on file  Stress: Not on file  Social Connections: Not on file   Additional Social History:    Allergies:  No Known Allergies  Labs:  Results for orders placed or performed during the hospital encounter of 12/02/23 (from the past 48 hours)  Comprehensive metabolic panel     Status: Abnormal   Collection Time: 12/11/23  4:58 AM  Result Value Ref Range   Sodium 142 135 - 145 mmol/L   Potassium 5.8 (H) 3.5 - 5.1 mmol/L   Chloride 106 98 - 111 mmol/L   CO2 27 22 - 32 mmol/L   Glucose, Bld 113 (H) 70 - 99 mg/dL    Comment: Glucose reference range applies only to samples taken after fasting for at least 8 hours.   BUN 18 6 - 20 mg/dL   Creatinine, Ser 1.61 (H) 0.44 - 1.00 mg/dL   Calcium 09.6 (H) 8.9 - 10.3 mg/dL   Total Protein 6.9 6.5 - 8.1 g/dL   Albumin 3.4 (L) 3.5 - 5.0 g/dL   AST 16 15 - 41 U/L   ALT 28 0 - 44 U/L   Alkaline Phosphatase 98 38 - 126 U/L   Total Bilirubin 0.7 0.0 - 1.2 mg/dL   GFR, Estimated >04 >54 mL/min    Comment: (NOTE) Calculated using the CKD-EPI Creatinine Equation (2021)    Anion gap 9 5 - 15    Comment: Performed at [facility], [address], [place], [place] [address]  Ammonia      Status: Abnormal   Collection Time: 12/11/23  4:58 AM  Result Value Ref Range   Ammonia 37 (H) 9 - 35 umol/L    Comment: Performed at [facility], [address] [place], [place] [address]  Magnesium      Status: None   Collection Time: 12/11/23  4:58 AM  Result Value Ref Range   Magnesium  2.0 1.7 - 2.4 mg/dL    Comment: Performed at [facility], [address], [place], [place] [address]  Angiotensin converting enzyme     Status: None   Collection Time: 12/11/23  9:02 AM  Result Value Ref Range   Angiotensin-Converting Enzyme 26 14 - 82 U/L    Comment: (NOTE) Performed At: [facility] [address] [place], [place] [id number] Pearlean Botts MD ON:[id number]   Ammonia     Status: None   Collection Time: 12/12/23  5:13 AM  Result Value Ref Range   Ammonia 25 9 - 35 umol/L    Comment: Performed at [facility], [address] [place], [place] [address]  Comprehensive metabolic panel     Status: Abnormal   Collection Time: 12/12/23  5:13 AM  Result Value Ref Range   Sodium 139 135 - 145 mmol/L   Potassium 4.3 3.5 - 5.1 mmol/L   Chloride 101 98 - 111 mmol/L   CO2 28 22 - 32 mmol/L   Glucose, Bld 120 (H) 70 - 99 mg/dL    Comment: Glucose reference range applies only to samples taken after fasting for at least 8 hours.   BUN 18 6 - 20 mg/dL   Creatinine, Ser 2.72 (H) 0.44 - 1.00 mg/dL   Calcium 9.9 8.9 - 53.6 mg/dL   Total Protein 6.9 6.5 - 8.1 g/dL   Albumin 3.3 (L) 3.5 - 5.0 g/dL   AST 13 (L) 15 - 41 U/L   ALT 24 0 - 44 U/L   Alkaline Phosphatase 102 38 - 126 U/L   Total Bilirubin 0.8 0.0 - 1.2 mg/dL   GFR, Estimated >64 >40 mL/min    Comment: (NOTE)  Calculated using the CKD-EPI Creatinine Equation (2021)    Anion gap 10 5 - 15    Comment: Performed at [facility], [address] [place], [place] [address]  Magnesium      Status: None   Collection Time: 12/12/23  5:13 AM  Result Value Ref Range   Magnesium  2.1 1.7 - 2.4 mg/dL     Comment: Performed at [facility], [address], [place], [place] [address]  CBC with Differential/Platelet     Status: Abnormal   Collection Time: 12/12/23  5:13 AM  Result Value Ref Range   WBC 12.4 (H) 4.0 - 10.5 K/uL   RBC 3.90 3.87 - 5.11 MIL/uL   Hemoglobin 12.1 12.0 - 15.0 g/dL   HCT 91.4 78.2 - 95.6 %   MCV 95.6 80.0 - 100.0 fL   MCH 31.0 26.0 - 34.0 pg   MCHC 32.4 30.0 - 36.0 g/dL   RDW 21.3 08.6 - 57.8 %   Platelets 383 150 - 400 K/uL   nRBC 0.0 0.0 - 0.2 %   Neutrophils Relative % 75 %   Neutro Abs 9.2 (H) 1.7 - 7.7 K/uL   Lymphocytes Relative 15 %   Lymphs Abs 1.8 0.7 - 4.0 K/uL   Monocytes Relative 10 %   Monocytes Absolute 1.3 (H) 0.1 - 1.0 K/uL   Eosinophils Relative 0 %   Eosinophils Absolute 0.0 0.0 - 0.5 K/uL   Basophils Relative 0 %   Basophils Absolute 0.0 0.0 - 0.1 K/uL   Immature Granulocytes 0 %   Abs Immature Granulocytes 0.04 0.00 - 0.07 K/uL    Comment: Performed at [facility], [address] [place], [place] [address]    Current Facility-Administered Medications  Medication Dose Route Frequency Provider Last Rate Last Admin   acetaminophen  (TYLENOL ) tablet 650 mg  650 mg Oral Q6H PRN Dea Evert, DO       Or   acetaminophen  (TYLENOL ) suppository 650 mg  650 mg Rectal Q6H PRN Dea Evert, DO       amLODipine  (NORVASC ) tablet 10 mg  10 mg Oral Daily Singh, Prashant K, MD   10 mg at 12/12/23 0647   feeding supplement (ENSURE ENLIVE / ENSURE PLUS) liquid 237 mL  237 mL Oral BID BM Reesa Cannon N, DO   237 mL at 12/12/23 9528   heparin  injection 5,000 Units  5,000 Units Subcutaneous Q8H Samtani, Jai-Gurmukh, MD   5,000 Units at 12/12/23 4132   hydrALAZINE  (APRESOLINE ) injection 10 mg  10 mg Intravenous Q6H PRN Burton Casey, MD   10 mg at 12/06/23 2004   lactulose  (CHRONULAC ) 10 GM/15ML solution 30 g  30 g Oral BID Singh, Prashant K, MD   30 g at 12/12/23 4401   metoprolol  tartrate (LOPRESSOR ) injection 5 mg  5 mg  Intravenous Q6H PRN Gardner, Jared M, DO       OLANZapine  (ZYPREXA ) tablet 5 mg  5 mg Oral QHS Brayton Calin, MD       ondansetron  (ZOFRAN ) tablet 4 mg  4 mg Oral Q6H PRN Dea Evert, DO       Or   ondansetron  (ZOFRAN ) injection 4 mg  4 mg Intravenous Q6H PRN Dea Evert, DO       Oral care mouth rinse  15 mL Mouth Rinse 4 times per day Singh, Prashant K, MD   15 mL at 12/12/23 0272   Oral care mouth rinse  15 mL  Mouth Rinse PRN Singh, Prashant K, MD       Oxcarbazepine  (TRILEPTAL ) tablet 300 mg  300 mg Oral QHS Idalia Allbritton A   300 mg at 12/11/23 2125   propranolol  (INDERAL ) tablet 10 mg  10 mg Oral BID Singh, Prashant K, MD   10 mg at 12/12/23 3016    Musculoskeletal:  Mental Status Exam: General Appearance: Care taken w/ personal hygeine  Orientation:  Full (Time, Place, and Person)  Memory:  Immediate;   Fair  Concentration:  Concentration: Good  Recall:  Fair  Attention  Good  Eye Contact:  Good  Speech:  Clear and Coherent  Language:  Good  Volume:  Normal  Mood: "I'm fine, just want to make sure I'm on the right meds".   Affect:  Congruent and Full Range  Thought Process:  Coherent, Goal Directed, and Linear  Thought Content:  devoid of overt delusions/paranoia  Suicidal Thoughts:  No  Homicidal Thoughts:  No  Judgement:  Fair  Insight:  Fair  Psychomotor Activity:  Normal  Akathisia:  No  Fund of Knowledge:  Fair   Physical Exam: Physical Exam Vitals and nursing note reviewed.  Constitutional:      Appearance: Normal appearance. She is normal weight.  Eyes:     Pupils: Pupils are equal, round, and reactive to light.  Skin:    General: Skin is warm.     Capillary Refill: Capillary refill takes less than 2 seconds.  Neurological:     General: No focal deficit present.     Mental Status: She is alert and oriented to person, place, and time. Mental status is at baseline.  Psychiatric:        Mood and Affect: Mood normal.        Behavior:  Behavior normal.        Thought Content: Thought content normal.        Judgment: Judgment normal.    Review of Systems  Psychiatric/Behavioral:  The patient is not nervous/anxious.   All other systems reviewed and are negative.  Blood pressure (!) 153/85, pulse 78, temperature 98.5 F (36.9 C), temperature source Oral, resp. rate 13, height 5\' 1"  (1.549 m), weight 59.7 kg, SpO2 97%. Body mass index is 24.87 kg/m.  Assessment Briefly, this is a patient with fairly well-controlled schizoaffective disorder on lithium  and Zyprexa  prior to admission.  She was medically admitted for COVID encephalopathy and was also found to have new CKD and toxic lithium  levels.  The role of the consult psychiatry team has been primarily to get her on a safe medication regimen to prevent decompensation after she is discharged.  She has been at or near her psychiatric baseline since recovering from COVID encephalopathy.  Treatment Plan Summary: Daily contact with patient to assess and evaluate symptoms and progress in treatment, Medication management, and Plan   -patient with symptoms of lithium  toxicity on initial exam, ordered stat lithium  level repeat - Continue to hold lithium . , Level 0.60. Patient no longer an ideal candidate for Lithium  - could be restarted in  subspecialized clinic/in conjunction with nephro (ie outpatient) AFTER failure of alternate mood stabilizers.  - unfortunately elevated baseline ammonia, depakote  no-go - starting oxcarbazepine  300 at bedtime would titrate up if well tolerated - start zyprexa  5 mg 1/15 home dose 25  -Will benefit from PT/OT consult while in the hospital. Increased falls at homes, ataxia, and deconditioning.   -TOC referral for SNF to help with reconditioning and strength training.  Labs reviewed: Including abnormal sodium of 138,  elevated creatinine 1.29,  calcium 10.3, elevated lithium  level of 1.28--> .58, ordered repeat Level VPA level < 10 LFT's  wnl  Disposition: No evidence of imminent risk to self or others at present.   Patient does not meet criteria for psychiatric inpatient admission. Supportive therapy provided about ongoing stressors. Discussed crisis plan, support from social network, calling 911, coming to the Emergency Department, and calling Suicide Hotline.  Daivd Dub A Mahi Zabriskie 12/12/2023 11:12 AM

## 2023-12-13 DIAGNOSIS — G9341 Metabolic encephalopathy: Secondary | ICD-10-CM | POA: Diagnosis not present

## 2023-12-13 MED ORDER — AMLODIPINE BESYLATE 10 MG PO TABS: 10.0000 mg | ORAL_TABLET | Freq: Every day | ORAL | Status: AC

## 2023-12-13 MED ORDER — OXCARBAZEPINE 300 MG PO TABS: 300.0000 mg | ORAL_TABLET | Freq: Every day | ORAL | Status: DC

## 2023-12-13 MED ORDER — OLANZAPINE 5 MG PO TABS: 5.0000 mg | ORAL_TABLET | Freq: Every day | ORAL | Status: DC

## 2023-12-13 MED ORDER — LACTULOSE 10 GM/15ML PO SOLN: 30.0000 g | Freq: Every day | ORAL | Status: DC

## 2023-12-13 NOTE — Consult Note (Addendum)
[facility] Face-to-Face Psychiatry Consult   Reason for Consult:  Significant psych history on lithium/Depakote/Zyprexa-came in with confusion-lithium levels are mildly elevated-had hypercalcemia/hypernatremia-she has improved with supportive care-discussed with neurology-wondering if you all could see her and see if we should resume her lithium and other medications.  Not sure if the electrolyte changes-mental status was from lithium toxicity-although levels only minimally elevated.  Referring Physician:  Dr. Jerral Ralph  Patient Identification: Paula Dickson MRN:  [id number] Principal Diagnosis: Acute metabolic encephalopathy Diagnosis:  Principal Problem:   Acute metabolic encephalopathy Active Problems:   Hypercalcemia   Hyperthyroidism   Schizoaffective disorder, bipolar type (HCC)   AKI (acute kidney injury) (HCC)   Paroxysmal atrial fibrillation (HCC)   COVID-19 virus infection   Total Time spent with patient: 30 minutes    Subjective:   Paula Dickson is a 58 y.o. female patient with a history of schizoaffective disorder-bipolar type who presents to [facility] due to confucian and lethargy behavior. Psych consult placed for  psych history on Lithium/Zyprexa and Depakote. Came in with confusion. Lithium levels are mildly elevated.   1/12: Spoke with nurse who states she had to feed her as patient unable to feed herself due to tremor. I spoke with patient's sister who states that she has had new onset tremor the past 3 months which she did not have before; is not aware of any lithium levels during this time but notes patient became more confused and less responsive 2 weeks ago and bedbound. I discussed r/b/a of lithium and due to continued concerns for lithium toxicity plan to stop it and switch to Depakote or Trileptal. I ordered a stat level.  Patient seen at bedside and with severe hand-flapping tremor, completed finger to nose neuro exam and patient is ataxic. She is alert but does  not remember who brought her to the hospital or why she is here. Patient is oriented to self and location, month but not date or year stating it is 2024. States she has had difficulty walking and had falls at one point but does not remember when. Patient denies wanting to be dead or feeling hopeless. She denies any suicidal or homicidal ideation, as well as any auditory or visual hallucinations.  1/13: Patient seen and reassessed today. She is observed waving at this provider though her window. She presents with a bright affect and smiling. She engages in conversation, asking about the providers weekend. She reports her sister visited. She is able to recall the changes made to her lithium by the previous provider. Patient did display some periods of memory fogginess however she remained appropriate. She reports that she is in agreeable with the change from Lithium to Depakote citing an increase in falls at home and increase in tremors. She endorses eating and sleeping well. Remains interested in physical therapy during this stay and transitioning to rehab. Patient denies any suicidal ideations, homicidal ideations and or hallucinations at this time. She reports compliance with her medications and denies any additional side effects.   1/14: Minimal change from what was described by Dr. Woodroe Mode. She remembers seh was supposed to go onto Depakote and was surprised this was not started; agreed with rationale once explained. She is agreeable to starting oxcarb but wanted her sister to be informed of all med changes. Feels that her tremors are a little better now than before and shared she had had them for years, but worsened shortly before admission. No SI, HI, AH/VH. Adequate short term memory (able to describe  PT treatment, etc w/ no issues).  Called sister and explained med changes. Let her know about main s/e (hyponatremia ie "low salt") as well as what to do (get blood test to test for salt if confusion). She  affirmed my s/o that there is no current concern for psychiatric deterioration, mostly concern that she will leave on ineffective med regimen d/t physical complaints. Did let her know oxcarb is generally less effective than lithium/depakote but best option at this time.   1/15: Saw pt very briefly (personal hygiene was being attended to). No major s/e from trileptal. No SI, HI, AH/VH. Amenable to restart zyprexa at lower dose. Called sister who was amenable to same.   1/16: Patient without any significant complaints this morning. Reports some depression when thinking about heath situation or reflecting on parents death. Denies SI, HI, AVH. Has good support with sister. Continues to be amenable with Zyprexa at current dose. She can continue to discuss medication management with her psychiatrist at [facility] when stable for medical discharge. Sister aware about medications and plan. Patient doesn't have any psychiatric contraindications to discharge and we will sign off.   Past Psychiatric History:  Schizoaffective disorder, Bipolar type, Generalized anxiety disorder.  Multiple inpatient hospitalizations. Inpatient at [facility] 08/23, [facility] 02/2022, [facility] 03/2022, [facility] 08/2022, 12/202, 09/2020.   Past Medical History:  Past Medical History:  Diagnosis Date   Bipolar affective disorder (HCC)    Bipolar disorder (HCC)    History of arthritis    History of chicken pox    History of depression    History of genital warts    history of heart murmur    History of high blood pressure    History of thyroid disease    History of UTI    Hypertension    Low TSH level 07/13/2017   Schizophrenia (HCC)     Past Surgical History:  Procedure Laterality Date   ABLATION ON ENDOMETRIOSIS     CYST REMOVAL NECK     around 11 years ago /benign   MULTIPLE TOOTH EXTRACTIONS     Family History:  Family History  Problem Relation Age of Onset   Arthritis Father    Hyperlipidemia Father     High blood pressure Father    Diabetes Sister    Diabetes Mother    Diabetes Brother    Mental illness Brother    Alcohol abuse Paternal Uncle    Alcohol abuse Paternal Grandfather    Breast cancer Maternal Aunt    Breast cancer Paternal Aunt    High blood pressure Sister    Mental illness Other        runs in family   Family Psychiatric  History: Denies Social History:  Social History   Substance and Sexual Activity  Alcohol Use Not Currently     Social History   Substance and Sexual Activity  Drug Use Not Currently    Social History   Socioeconomic History   Marital status: Single    Spouse name: Not on file   Number of children: Not on file   Years of education: Not on file   Highest education level: Patient refused  Occupational History   Not on file  Tobacco Use   Smoking status: Never   Smokeless tobacco: Never  Vaping Use   Vaping status: Never Used  Substance and Sexual Activity   Alcohol use: Not Currently   Drug use: Not Currently   Sexual activity: Not Currently  Other Topics Concern   Not on file  Social History Narrative   ** Merged History Encounter **       Social Drivers of Corporate investment banker Strain: Not on file  Food Insecurity: Not on file  Transportation Needs: Not on file  Physical Activity: Not on file  Stress: Not on file  Social Connections: Not on file   Additional Social History:    Allergies:  No Known Allergies  Labs:  Results for orders placed or performed during the hospital encounter of 12/02/23 (from the past 48 hours)  Ammonia     Status: None   Collection Time: 12/12/23  5:13 AM  Result Value Ref Range   Ammonia 25 9 - 35 umol/L    Comment: Performed at [facility], [address] [place], [place] [address]  Comprehensive metabolic panel     Status: Abnormal   Collection Time: 12/12/23  5:13 AM  Result Value Ref Range   Sodium 139 135 - 145 mmol/L   Potassium 4.3 3.5 - 5.1 mmol/L    Chloride 101 98 - 111 mmol/L   CO2 28 22 - 32 mmol/L   Glucose, Bld 120 (H) 70 - 99 mg/dL    Comment: Glucose reference range applies only to samples taken after fasting for at least 8 hours.   BUN 18 6 - 20 mg/dL   Creatinine, Ser 1.06 (H) 0.44 - 1.00 mg/dL   Calcium 9.9 8.9 - 26.9 mg/dL   Total Protein 6.9 6.5 - 8.1 g/dL   Albumin 3.3 (L) 3.5 - 5.0 g/dL   AST 13 (L) 15 - 41 U/L   ALT 24 0 - 44 U/L   Alkaline Phosphatase 102 38 - 126 U/L   Total Bilirubin 0.8 0.0 - 1.2 mg/dL   GFR, Estimated >48 >54 mL/min    Comment: (NOTE) Calculated using the CKD-EPI Creatinine Equation (2021)    Anion gap 10 5 - 15    Comment: Performed at [facility], [address], [place], [place] [address]  Magnesium     Status: None   Collection Time: 12/12/23  5:13 AM  Result Value Ref Range   Magnesium 2.1 1.7 - 2.4 mg/dL    Comment: Performed at [facility], [address], [place], [place] [address]  CBC with Differential/Platelet     Status: Abnormal   Collection Time: 12/12/23  5:13 AM  Result Value Ref Range   WBC 12.4 (H) 4.0 - 10.5 K/uL   RBC 3.90 3.87 - 5.11 MIL/uL   Hemoglobin 12.1 12.0 - 15.0 g/dL   HCT 81.8 29.9 - 37.1 %   MCV 95.6 80.0 - 100.0 fL   MCH 31.0 26.0 - 34.0 pg   MCHC 32.4 30.0 - 36.0 g/dL   RDW 69.6 78.9 - 38.1 %   Platelets 383 150 - 400 K/uL   nRBC 0.0 0.0 - 0.2 %   Neutrophils Relative % 75 %   Neutro Abs 9.2 (H) 1.7 - 7.7 K/uL   Lymphocytes Relative 15 %   Lymphs Abs 1.8 0.7 - 4.0 K/uL   Monocytes Relative 10 %   Monocytes Absolute 1.3 (H) 0.1 - 1.0 K/uL   Eosinophils Relative 0 %   Eosinophils Absolute 0.0 0.0 - 0.5 K/uL   Basophils Relative 0 %   Basophils Absolute 0.0 0.0 - 0.1 K/uL   Immature Granulocytes 0 %   Abs Immature Granulocytes 0.04 0.00 - 0.07 K/uL  Comment: Performed at [facility], [address] [place], [place] [address]    Current Facility-Administered Medications  Medication Dose Route Frequency Provider Last Rate  Last Admin   acetaminophen (TYLENOL) tablet 650 mg  650 mg Oral Q6H PRN Hillary Bow, DO       Or   acetaminophen (TYLENOL) suppository 650 mg  650 mg Rectal Q6H PRN Hillary Bow, DO       amLODipine (NORVASC) tablet 10 mg  10 mg Oral Daily Leroy Sea, MD   10 mg at 12/12/23 0647   feeding supplement (ENSURE ENLIVE / ENSURE PLUS) liquid 237 mL  237 mL Oral BID BM Dow Adolph N, DO   237 mL at 12/12/23 1403   heparin injection 5,000 Units  5,000 Units Subcutaneous Q8H Rhetta Mura, MD   5,000 Units at 12/13/23 0616   hydrALAZINE (APRESOLINE) injection 10 mg  10 mg Intravenous Q6H PRN Maretta Bees, MD   10 mg at 12/06/23 2004   lactulose (CHRONULAC) 10 GM/15ML solution 30 g  30 g Oral BID Leroy Sea, MD   30 g at 12/12/23 2109   metoprolol tartrate (LOPRESSOR) injection 5 mg  5 mg Intravenous Q6H PRN Hillary Bow, DO       OLANZapine (ZYPREXA) tablet 5 mg  5 mg Oral QHS Peterson Ao, MD   5 mg at 12/12/23 2109   ondansetron (ZOFRAN) tablet 4 mg  4 mg Oral Q6H PRN Hillary Bow, DO       Or   ondansetron [facility]) injection 4 mg  4 mg Intravenous Q6H PRN Hillary Bow, DO       Oral care mouth rinse  15 mL Mouth Rinse 4 times per day Leroy Sea, MD   15 mL at 12/12/23 2109   Oral care mouth rinse  15 mL Mouth Rinse PRN Leroy Sea, MD       Oxcarbazepine (TRILEPTAL) tablet 300 mg  300 mg Oral QHS Cinderella, Margaret A   300 mg at 12/12/23 2109   propranolol (INDERAL) tablet 10 mg  10 mg Oral BID Leroy Sea, MD   10 mg at 12/12/23 2109    Musculoskeletal:  Mental Status Exam: General Appearance: Care taken w/ personal hygeine  Orientation:  Full (Time, Place, and Person)  Memory:  Immediate;   Fair  Concentration:  Concentration: Good  Recall:  Fair  Attention  Good  Eye Contact:  Good  Speech:  Clear and Coherent  Language:  Good  Volume:  Normal  Mood: "pretty good".   Affect:  Congruent and Full Range  Thought  Process:  Coherent, Goal Directed, and Linear  Thought Content:  devoid of overt delusions/paranoia  Suicidal Thoughts:  No  Homicidal Thoughts:  No  Judgement:  Fair  Insight:  Fair  Psychomotor Activity:  Normal  Akathisia:  No  Fund of Knowledge:  Fair   Physical Exam: Physical Exam Vitals and nursing note reviewed.  Constitutional:      Appearance: Normal appearance. She is normal weight.  Eyes:     Pupils: Pupils are equal, round, and reactive to light.  Skin:    General: Skin is warm.     Capillary Refill: Capillary refill takes less than 2 seconds.  Neurological:     General: No focal deficit present.     Mental Status: She is alert and oriented to person, place, and time. Mental status is at baseline.  Psychiatric:  Mood and Affect: Mood normal.        Behavior: Behavior normal.        Thought Content: Thought content normal.        Judgment: Judgment normal.    Review of Systems  Psychiatric/Behavioral:  The patient is not nervous/anxious.   All other systems reviewed and are negative.  Blood pressure 109/71, pulse 66, temperature 99.1 F (37.3 C), temperature source Axillary, resp. rate 16, height 5\' 1"  (1.549 m), weight 59.7 kg, SpO2 97%. Body mass index is 24.87 kg/m.  Assessment Briefly, this is a patient with fairly well-controlled schizoaffective disorder on lithium and Zyprexa prior to admission.  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  The role of the consult psychiatry team has been primarily to get her on a safe medication regimen to prevent decompensation after she is discharged.  She has been at or near her psychiatric baseline since recovering from COVID encephalopathy.  Treatment Plan Summary: Daily contact with patient to assess and evaluate symptoms and progress in treatment, Medication management, and Plan   -patient with symptoms of lithium toxicity on initial exam,  ordered stat lithium level repeat - Continue to hold lithium. , Level 0.60. Patient no longer an ideal candidate for Lithium - could be restarted in  subspecialized clinic/in conjunction with nephro (ie outpatient) AFTER failure of alternate mood stabilizers.  - unfortunately elevated baseline ammonia, depakote no-go - Continue oxcarbazepine 300 mg at bedtime would titrate up if well tolerated - Continue zyprexa 5 mg (started 1/15),  home dose 25 mg - Patient instructed to follow-up up [facility] Psych[facility]rist once discharged for further medication managment  -Will benefit from PT/OT consult while in the hospital. Increased falls at homes, ataxia, and deconditioning.   -TOC referral for SNF to help with reconditioning and strength training.   Labs reviewed: Including abnormal sodium of 138,  elevated creatinine 1.29,  calcium 10.3, elevated lithium level of 1.28--> .58, ordered repeat Level VPA level < 10 LFT's wnl  Disposition: No evidence of imminent risk to self or others at present.   Patient does not meet criteria for psychiatric inpatient admission. Supportive therapy provided about ongoing stressors. Discussed crisis plan, support from social network, calling 911, coming to the Emergency Department, and calling Suicide Hotline.  Peterson Ao, MD 12/13/2023 9:03 AM

## 2023-12-13 NOTE — Plan of Care (Signed)
Pt has rested quietly throughout the night with no distress noted. Alert and oriented to person and place. On room air. [place] intact to suction. No complaints voiced.     Problem: Respiratory: Goal: Will maintain a patent airway Outcome: Progressing Goal: Complications related to the disease process, condition or treatment will be avoided or minimized Outcome: Progressing   Problem: Clinical Measurements: Goal: Respiratory complications will improve Outcome: Progressing   Problem: Activity: Goal: Risk for activity intolerance will decrease Outcome: Progressing   Problem: Nutrition: Goal: Adequate nutrition will be maintained Outcome: Progressing   Problem: Coping: Goal: Level of anxiety will decrease Outcome: Progressing   Problem: Elimination: Goal: Will not experience complications related to urinary retention Outcome: Progressing   Problem: Pain Management: Goal: General experience of comfort will improve Outcome: Progressing

## 2023-12-13 NOTE — Discharge Summary (Addendum)
Paula Dickson KVQ:[id number] DOB: [birth year] DOA: 12/02/2023  PCP: Vesta Mixer  Admit date: 12/02/2023  Discharge date: 12/14/2023  Admitted From: Home   Disposition:  SNF   Recommendations for Outpatient Follow-up:   Follow up with PCP in 1-2 weeks  PCP Please obtain BMP/CBC, 2 view CXR in 1week,  (see Discharge instructions)   PCP Please follow up on the following pending results:    Home Health: None   Equipment/Devices: None  Consultations: Neuro, Psych Discharge Condition: Stable    CODE STATUS: Full    Diet Recommendation: Heart Healthy     Chief Complaint  Patient presents with   Altered Mental Status     Brief history of present illness from the day of admission and additional interim summary    58 y.o.  female with history of bipolar disorder, hyperthyroidism-please continue with altered mental status-found to have COVID-19 infection.   Significant events: 1/5>> admit to [facility]   Significant studies: 1/5>> CXR: Left basilar airspace disease. 1/5>> MRI brain: No acute intracranial abnormality 1/5>> NH4:<10 1/5>> TSH: Within normal limit 1/6>> EEG: No seizures 1/6>> UDS: Negative 1/6>> CSF: WBC 1, protein 48 1/7>> echo: EF 55-60%   Significant microbiology data: 1/5>> COVID PCR: Positive 1/5>> influenza/RSV PCR: Negative 1/6>> blood culture: Negative 1/6>> CSF culture: Negative 1/6>> CSF biofire panel: Negative   Procedures: 1/6>> fluoroscopy-guided LP   Consults: Neurology Psychiatry                                                                 Hospital Course   Acute metabolic encephalopathy  Etiology unclear but suspect multifactorial-from COVID-19 infection-mild elevation in lithium levels-mild hypernatremia-AKI all playing a role. Neuro imaging/CSF analysis/Spot EEG all  negative Significantly better-completely awake and alert and back to baseline Appreciate neurology and psych input.   Tremors Chronic in nature discussed with patient and sister present for multiple years Unclear significance, ammonia mildly elevated and trending down, will leave her on lactulose once a day with outpatient neurology follow-up.  Must follow-up with her neurologist in 1 to 2 weeks postdischarge.   COVID-19 infection On room air-minimal infiltrate seen on x-ray on left base-possible aspiration rather than from COVID. Supportive care at this point   ?  Aspiration pneumonia Small infiltrate on left lung base-could have aspirated due to encephalopathy ABX treatment finished Evaluated by SLP-placed on regular diet on 1/8-will follow closely.   Hyperkalemia.  Treated, monitor.   Hypercalcemia Mild, PTH levels mildly elevated in the clinical scenario, 1,25 vitamin D levels are low, calcitriol levels are stable, hypercalcemia could be due to dehydration and reduced by high levels of lithium, corrected calcium level still elevated, continue to hydrate, calcitonin initiated on 12/09/2023, dose increased further on 12/11/2023, IV fluids and Lasix also given on 12/11/2023, calcium improved on 12/12/2023.  Will require outpatient monitoring of CMP and outpatient endocrine follow-up in 1 to 2 weeks postdischarge.   AKI on CKD 2 AKI likely hemodynamically mediated Slowly improving with supportive care Follow electrolytes closely.   PAF EKG on admit consistent with A-fib Has been sinus rhythm on telemetry since admit TSH stable With stable EF Possible that A-fib triggered by COVID-19 infection-appears brief-not sure if she is a good long-term candidate for anticoagulation- given psych issues-?  Compliance For now-monitor off anticoagulation   History of hyperthyroidism Does not appear to be on Tapazole any longer Not sure if she is on propranolol-on home med list TSH within normal  limits Stable for outpatient monitoring   Bipolar disorder Initially both Depakote/lithium were held-once patient improved-psychiatry evaluation was requested-Depakote was restarted-however on. Psych managing her medications currently off of lithium and Depakote.  Now placed on Trileptal/Oxcarbazepine by psychiatry.  Also on nighttime Zyprexa which can be gradually uptitrated if needed.  Now she seems to be doing well on the present regimen.   Debility/deconditioning Spoke with Sister-[name]-1/7-mostly bedbound-able to take a few steps to go to the bathroom/kitchen-but basically stopped walking long distance last week of November.  Apparently she is afraid of falling.  For the past several days-mostly bedbound-with poor oral intake.  No sick contacts. PT/OT/SLP eval-SNF discharge for continued PT OT.     Discharge diagnosis     Principal Problem:   Acute metabolic encephalopathy Active Problems:   Hypercalcemia   AKI (acute kidney injury) (HCC)   Paroxysmal atrial fibrillation (HCC)   COVID-19 virus infection   Hyperthyroidism   Schizoaffective disorder, bipolar type [facility])    Discharge instructions    Discharge Instructions     Diet - low sodium heart healthy   Complete by: As directed    Discharge instructions   Complete by: As directed    Follow with Primary MD [name] in 7 days, follow-up with your psychiatrist within a week of discharge.  Get CBC, CMP, 2 view Chest X ray -  checked next visit with your primary MD or SNF MD    Activity: As tolerated with Full fall precautions use walker/cane & assistance as needed  Disposition Home   Diet: Heart Healthy   Special Instructions: If you have smoked or chewed Tobacco  in the last 2 yrs please stop smoking, stop any regular Alcohol  and or any Recreational drug use.  On your next visit with your primary care physician please Get Medicines reviewed and adjusted.  Please request your Prim.MD to go over all Hospital Tests  and Procedure/Radiological results at the follow up, please get all Hospital records sent to your Prim MD by signing hospital release before you go home.  If you experience worsening of your admission symptoms, develop shortness of breath, life threatening emergency, suicidal or homicidal thoughts you must seek medical attention immediately by calling 911 or calling your MD immediately  if symptoms less severe.  You Must read complete instructions/literature along with all the possible adverse reactions/side effects for all the Medicines you take and that have been prescribed to you. Take any new Medicines after you have completely understood and accpet all the possible adverse reactions/side effects.   Do not drive when taking Pain medications.  Do not take more than prescribed Pain, Sleep and Anxiety Medications   Increase activity slowly   Complete by: As directed        Discharge Medications   Allergies as of 12/14/2023   No Known Allergies  Medication List     STOP taking these medications    benztropine 0.5 MG tablet Commonly known as: COGENTIN   divalproex 500 MG DR tablet Commonly known as: DEPAKOTE   hydrOXYzine 50 MG tablet Commonly known as: ATARAX   lithium 300 MG tablet   LORazepam 1 MG tablet Commonly known as: ATIVAN       TAKE these medications    amLODipine 10 MG tablet Commonly known as: NORVASC Take 1 tablet (10 mg total) by mouth daily.   lactulose 10 GM/15ML solution Commonly known as: CHRONULAC Take 45 mLs (30 g total) by mouth daily.   OLANZapine 5 MG tablet Commonly known as: ZYPREXA Take 1 tablet (5 mg total) by mouth at bedtime. What changed:  when to take this Another medication with the same name was removed. Continue taking this medication, and follow the directions you see here.   Oxcarbazepine 300 MG tablet Commonly known as: TRILEPTAL Take 1 tablet (300 mg total) by mouth at bedtime.   propranolol 10 MG tablet Commonly  known as: INDERAL Take 1 tablet (10 mg total) by mouth 2 (two) times daily. What changed: when to take this         Contact information for follow-up providers     [name]. Schedule an appointment as soon as possible for a visit in 1 week(s).   Why: Outpatient psych follow-up in 1 to 2 weeks Contact information: [address]  [address] [place] [address] [phone]         [facility]. Schedule an appointment as soon as possible for a visit in 1 week(s).   Contact information: [address]     [address] [place] [address] [phone]             Contact information for after-discharge care     Destination     [facility] Preferred SNF .   Service: Skilled Nursing Contact information: [address] [place] [address] [phone]                     Major procedures and Radiology Reports - PLEASE review detailed and final reports thoroughly  -      ECHOCARDIOGRAM COMPLETE Result Date: 12/04/2023    ECHOCARDIOGRAM REPORT   Patient Name:   Paula Dickson Date of Exam: 12/04/2023 Medical Rec #:  [id number]             Height:       61.0 in Accession #:    [id number]            Weight:       131.6 lb Date of Birth:  1966-04-29             BSA:          1.581 m Patient Age:    57 years              BP:           150/87 mmHg Patient Gender: F                     HR:           75 bpm. Exam Location:  Inpatient Procedure: 2D Echo, Cardiac Doppler and Color Doppler Indications:    Afib  History:        Patient has prior history of Echocardiogram examinations, most  recent 02/06/2021. Arrythmias:Atrial Fibrillation; Risk                 Factors:Hypertension.  Sonographer:    Meagan Baucom RDCS, FE, PE Referring Phys: [id number] Lean GHIMIRE IMPRESSIONS  1. Left ventricular ejection fraction, by estimation, is 55 to 60%. The left ventricle has normal function. The left  ventricle has no regional wall motion abnormalities. Left ventricular diastolic parameters were normal.  2. Right ventricular systolic function is normal. The right ventricular size is mildly enlarged. There is normal pulmonary artery systolic pressure. The estimated right ventricular systolic pressure is 28.8 mmHg.  3. The mitral valve is grossly normal. Trivial mitral valve regurgitation. No evidence of mitral stenosis.  4. The aortic valve is tricuspid. Aortic valve regurgitation is not visualized. No aortic stenosis is present.  5. The inferior vena cava is normal in size with greater than 50% respiratory variability, suggesting right atrial pressure of 3 mmHg. Comparison(s): No significant change from prior study. FINDINGS  Left Ventricle: Left ventricular ejection fraction, by estimation, is 55 to 60%. The left ventricle has normal function. The left ventricle has no regional wall motion abnormalities. The left ventricular internal cavity size was normal in size. There is  no left ventricular hypertrophy. Left ventricular diastolic parameters were normal. Right Ventricle: The right ventricular size is mildly enlarged. No increase in right ventricular wall thickness. Right ventricular systolic function is normal. There is normal pulmonary artery systolic pressure. The tricuspid regurgitant velocity is 2.54  m/s, and with an assumed right atrial pressure of 3 mmHg, the estimated right ventricular systolic pressure is 28.8 mmHg. Left Atrium: Left atrial size was normal in size. Right Atrium: Right atrial size was normal in size. Pericardium: Trivial pericardial effusion is present. Mitral Valve: The mitral valve is grossly normal. Trivial mitral valve regurgitation. No evidence of mitral valve stenosis. Tricuspid Valve: The tricuspid valve is grossly normal. Tricuspid valve regurgitation is trivial. No evidence of tricuspid stenosis. Aortic Valve: The aortic valve is tricuspid. Aortic valve regurgitation is not  visualized. No aortic stenosis is present. Aortic valve mean gradient measures 3.0 mmHg. Aortic valve peak gradient measures 5.9 mmHg. Aortic valve area, by VTI measures 2.06 cm. Pulmonic Valve: The pulmonic valve was grossly normal. Pulmonic valve regurgitation is not visualized. No evidence of pulmonic stenosis. Aorta: The aortic root is normal in size and structure. Venous: The inferior vena cava is normal in size with greater than 50% respiratory variability, suggesting right atrial pressure of 3 mmHg. IAS/Shunts: There is redundancy of the interatrial septum. The atrial septum is grossly normal.  LEFT VENTRICLE PLAX 2D LVIDd:         4.60 cm   Diastology LVIDs:         2.90 cm   LV e' medial:    7.72 cm/s LV PW:         1.00 cm   LV E/e' medial:  7.6 LV IVS:        1.00 cm   LV e' lateral:   7.29 cm/s LVOT diam:     2.00 cm   LV E/e' lateral: 8.0 LV SV:         53 LV SV Index:   33 LVOT Area:     3.14 cm  RIGHT VENTRICLE RV S prime:     18.60 cm/s TAPSE (M-mode): 1.8 cm LEFT ATRIUM             Index        RIGHT  ATRIUM           Index LA diam:        4.00 cm 2.53 cm/m   RA Area:     13.20 cm LA Vol (A2C):   30.6 ml 19.35 ml/m  RA Volume:   27.20 ml  17.20 ml/m LA Vol (A4C):   42.3 ml 26.75 ml/m LA Biplane Vol: 36.2 ml 22.90 ml/m  AORTIC VALVE AV Area (Vmax):    2.20 cm AV Area (Vmean):   2.15 cm AV Area (VTI):     2.06 cm AV Vmax:           121.00 cm/s AV Vmean:          80.200 cm/s AV VTI:            0.256 m AV Peak Grad:      5.9 mmHg AV Mean Grad:      3.0 mmHg LVOT Vmax:         84.75 cm/s LVOT Vmean:        54.950 cm/s LVOT VTI:          0.168 m LVOT/AV VTI ratio: 0.66  AORTA Ao Root diam: 2.80 cm MITRAL VALVE               TRICUSPID VALVE MV Area (PHT): 4.31 cm    TR Peak grad:   25.8 mmHg MV Decel Time: 176 msec    TR Vmax:        254.00 cm/s MV E velocity: 58.30 cm/s MV A velocity: 79.70 cm/s  SHUNTS MV E/A ratio:  0.73        Systemic VTI:  0.17 m                            Systemic Diam:  2.00 cm Lennie Odor MD Electronically signed by Lennie Odor MD Signature Date/Time: 12/04/2023/3:29:40 PM    Final    DG FL GUIDED LUMBAR PUNCTURE Result Date: 12/03/2023 CLINICAL DATA:  58 year old female presents with altered mental status, hypothermia and concern for neuroleptic malignant syndrome. EXAM: DIAGNOSTIC LUMBAR PUNCTURE UNDER FLUOROSCOPIC GUIDANCE COMPARISON:  None Available. FLUOROSCOPY: Radiation Exposure Index (as provided by the fluoroscopic device): 3.2 mGy Kerma PROCEDURE: Informed consent was obtained from the patient's sister Miss Gigi Gin Torosian prior to the procedure, including potential risks and complications of headache, allergy, pain, bleeding, infection, damage to adjacent structures, spinal canal hematoma, CSF leak which may require additional procedure, and low yield. Time-out was performed to verify correct patient and correct procedure. Allergies were reviewed. With the patient prone, the lower back was prepped with Betadine. 1% Lidocaine was used for local anesthesia. Lumbar puncture was performed at the L2-L3 level using a 3.5 inch, 20 gauge needle with return of clear, colorless CSF with an opening pressure of 14 cm water. 20 ml of CSF were obtained for laboratory studies. The spinal needle was removed, sterile bandage was placed. The patient tolerated the procedure well and there were no apparent complications. IMPRESSION: Technically successful fluoro guided lumbar puncture. This procedure was performed by Lawernce Ion, PA-C under supervision of Richarda Overlie, MD. Electronically Signed   By: Richarda Overlie M.D.   On: 12/03/2023 15:36   EEG adult Result Date: 12/03/2023 Charlsie Quest, MD     12/03/2023 10:32 AM Patient Name: Indria Dood Dunavant MRN: [id number] Epilepsy Attending: Charlsie Quest Referring Physician/Provider: Hillary Bow, DO Date: 12/03/2023 Duration: 25.40 mins Patient history:  58yo F with ams getting eeg to evaluate for seizure Level of alertness:  Awake,/lethargic AEDs during EEG study: None Technical aspects: This EEG study was done with scalp electrodes positioned according to the 10-20 International system of electrode placement. Electrical activity was reviewed with band pass filter of 1-70Hz , sensitivity of 7 uV/mm, display speed of 27mm/sec with a 60Hz  notched filter applied as appropriate. EEG data were recorded continuously and digitally stored.  Video monitoring was available and reviewed as appropriate. Description: No clear posterior dominant rhythm was seen. EEG showed continuous generalized polymorphic sharply contoured 3 to 6 Hz theta-delta slowing, at times with triphasic morphology.  Hyperventilation and photic stimulation were not performed.   ABNORMALITY - Continuous slow, generalized IMPRESSION: This study is suggestive of moderate diffuse encephalopathy likely related to toxic-metabolic causes. No seizures or definite epileptiform discharges were seen throughout the recording. Charlsie Quest   MR BRAIN WO CONTRAST Result Date: 12/02/2023 CLINICAL DATA:  Neuro deficit, acute, stroke suspected. EXAM: MRI HEAD WITHOUT CONTRAST TECHNIQUE: Multiplanar, multiecho pulse sequences of the brain and surrounding structures were obtained without intravenous contrast. COMPARISON:  Head CT 12/02/2023 and MRI 10/13/2019 FINDINGS: The study is intermittently moderately motion degraded. Brain: There is no evidence of an acute infarct, intracranial hemorrhage, mass, midline shift, or extra-axial fluid collection. There is generalized cerebral atrophy which is mildly to moderately advanced for age. No significant white matter disease is evident. Vascular: Major intracranial vascular flow voids are preserved. Skull and upper cervical spine: Unremarkable bone marrow signal. Sinuses/Orbits: Extensive mucosal thickening in the bilateral ethmoid and left sphenoid and left maxillary sinuses with milder mucosal thickening in the other sinuses. Clear mastoid air  cells. Unremarkable orbits. Other: None. IMPRESSION: 1. Motion degraded examination. No evidence of acute intracranial abnormality. 2. Mildly to moderately age advanced cerebral atrophy. 3. Paranasal sinus disease. Electronically Signed   By: Sebastian Ache M.D.   On: 12/02/2023 18:35   DG Chest Portable 1 View Result Date: 12/02/2023 CLINICAL DATA:  Altered mental status. EXAM: PORTABLE CHEST 1 VIEW COMPARISON:  Chest radiograph dated 03/22/2021. FINDINGS: The heart size and mediastinal contours are within normal limits. Mild left basilar atelectasis/airspace disease. The right lung is clear. No pleural effusion or pneumothorax. The visualized skeletal structures are unremarkable. IMPRESSION: Mild left basilar atelectasis/airspace disease. Electronically Signed   By: Romona Curls M.D.   On: 12/02/2023 15:40   CT Head Wo Contrast Result Date: 12/02/2023 CLINICAL DATA:  Mental status change, unknown cause EXAM: CT HEAD WITHOUT CONTRAST TECHNIQUE: Contiguous axial images were obtained from the base of the skull through the vertex without intravenous contrast. RADIATION DOSE REDUCTION: This exam was performed according to the departmental dose-optimization program which includes automated exposure control, adjustment of the mA and/or kV according to patient size and/or use of iterative reconstruction technique. COMPARISON:  Head CT 03/18/2021 FINDINGS: Brain: No hemorrhage. No hydrocephalus. No extra-axial fluid collection. No mass effect. No mass lesion. No CT evidence of an acute cortical infarct. Vascular: No hyperdense vessel or unexpected calcification. Skull: Normal. Negative for fracture or focal lesion. Sinuses/Orbits: No middle ear or mastoid effusion. Pansinus mucosal thickening. Orbits are unremarkable. Other: None. IMPRESSION: No CT etiology for altered mental status identified Electronically Signed   By: Lorenza Cambridge M.D.   On: 12/02/2023 15:06    Micro Results    No results found for this or any  previous visit (from the past 240 hours).   Today   Subjective    Rolande Sjostrom today  has no headache,no chest abdominal pain,no new weakness tingling or numbness, feels much better wants to go home today.    Objective   Blood pressure 126/69, pulse 68, temperature 98.2 F (36.8 C), temperature source Oral, resp. rate (!) 22, height 5\' 1"  (1.549 m), weight 59.7 kg, SpO2 96%.   Intake/Output Summary (Last 24 hours) at 12/14/2023 1108 Last data filed at 12/14/2023 0316 Gross per 24 hour  Intake 500 ml  Output 600 ml  Net -100 ml    Exam  Awake Alert, No new F.N deficits,    Emanuel.AT,PERRAL Supple Neck,   Symmetrical Chest wall movement, Good air movement bilaterally, CTAB RRR,No Gallops,   +ve B.Sounds, Abd Soft, Non tender,  No Cyanosis, Clubbing or edema    Data Review   Recent Labs  Lab 12/08/23 0542 12/09/23 0550 12/10/23 0943 12/12/23 0513  WBC 5.3 6.6 7.3 12.4*  HGB 11.5* 12.0 12.2 12.1  HCT 35.8* 37.4 37.7 37.3  PLT 141* 166 221 383  MCV 96.0 96.9 96.2 95.6  MCH 30.8 31.1 31.1 31.0  MCHC 32.1 32.1 32.4 32.4  RDW 16.1* 15.7* 15.5 15.2  LYMPHSABS 2.2 2.3 2.0 1.8  MONOABS 0.7 0.8 0.7 1.3*  EOSABS 0.2 0.3 0.3 0.0  BASOSABS 0.0 0.0 0.0 0.0    Recent Labs  Lab 12/08/23 0542 12/09/23 0550 12/10/23 0434 12/10/23 0943 12/11/23 0458 12/12/23 0513  NA 142 138 139  --  142 139  K 4.5 4.2 5.0  --  5.8* 4.3  CL 109 105 104  --  106 101  CO2 25 25 21*  --  27 28  ANIONGAP 8 8 14   --  9 10  GLUCOSE 98 139* 93  --  113* 120*  BUN 26* 24* 24*  --  18 18  CREATININE 1.16* 1.29* 1.22*  --  1.02* 1.01*  AST  --  34 22  --  16 13*  ALT  --  37 32  --  28 24  ALKPHOS  --  99 95  --  98 102  BILITOT  --  0.4 0.7  --  0.7 0.8  ALBUMIN  --  2.9* 2.9*  --  3.4* 3.3*  AMMONIA  --   --   --  46* 37* 25  BNP  --  7.3  --   --   --   --   MG  --  1.9  --  1.9 2.0 2.1  PHOS  --  3.7  --  3.3  --   --   CALCIUM 10.4* 10.3 10.2  --  10.9* 9.9    Total Time in  preparing paper work, data evaluation and todays exam - 35 minutes  Signature  -    Susa Raring M.D on 12/14/2023 at 11:08 AM   -  To page go to [email or web address]

## 2023-12-13 NOTE — TOC Progression Note (Signed)
Transition of Care [facility]) - Progression Note    Patient Details  Name: Paula Dickson MRN: [id number] Date of Birth: February 04, 1966  Transition of Care [facility] CM/SW Contact  Mearl Latin, LCSW Phone Number: 12/13/2023, 9:07 AM  Clinical Narrative:    [facility] stated they are having to figure out room rearrangements. Will continue to follow.    Expected Discharge Plan: Skilled Nursing Facility Barriers to Discharge: Continued Medical Work up, English as a second language teacher, SNF Pending bed offer, SNF Covid  Expected Discharge Plan and Services In-house Referral: Clinical Social Work   Post Acute Care Choice: Skilled Nursing Facility Living arrangements for the past 2 months: Single Family Home Expected Discharge Date: 12/13/23                                     Social Determinants of Health (SDOH) Interventions SDOH Screenings   Alcohol Screen: Low Risk  (11/23/2021)  Tobacco Use: Low Risk  (12/02/2023)    Readmission Risk Interventions     No data to display

## 2023-12-13 NOTE — Discharge Instructions (Addendum)
Follow with Primary MD [name] in 7 days, follow-up with your psychiatrist within a week of discharge.  Get CBC, CMP, 2 view Chest X ray -  checked next visit with your primary MD or SNF MD    Activity: As tolerated with Full fall precautions use walker/cane & assistance as needed  Disposition Home   Diet: Heart Healthy   Special Instructions: If you have smoked or chewed Tobacco  in the last 2 yrs please stop smoking, stop any regular Alcohol  and or any Recreational drug use.  On your next visit with your primary care physician please Get Medicines reviewed and adjusted.  Please request your Prim.MD to go over all Hospital Tests and Procedure/Radiological results at the follow up, please get all Hospital records sent to your Prim MD by signing hospital release before you go home.  If you experience worsening of your admission symptoms, develop shortness of breath, life threatening emergency, suicidal or homicidal thoughts you must seek medical attention immediately by calling 911 or calling your MD immediately  if symptoms less severe.  You Must read complete instructions/literature along with all the possible adverse reactions/side effects for all the Medicines you take and that have been prescribed to you. Take any new Medicines after you have completely understood and accpet all the possible adverse reactions/side effects.   Do not drive when taking Pain medications.  Do not take more than prescribed Pain, Sleep and Anxiety Medications

## 2023-12-14 DIAGNOSIS — G9341 Metabolic encephalopathy: Secondary | ICD-10-CM | POA: Diagnosis not present

## 2023-12-14 DIAGNOSIS — T56891D Toxic effect of other metals, accidental (unintentional), subsequent encounter: Secondary | ICD-10-CM

## 2023-12-14 NOTE — Progress Notes (Signed)
[facility]                                                                                                                                                                         Patient Demographics  Paula Dickson, is a 58 y.o. female  ZOX:[id number]  [id number]  DOB - Jul 23, 1966  Admit date - 12/02/2023  Admitting Physician Hillary Bow, DO  Outpatient Primary MD for the patient is [facility]  LOS - 11   Chief Complaint  Patient presents with   Altered Mental Status        Assessment & Plan    Patient seen briefly today due for discharge soon per Discharge done yesterday by me, no further issues, Vital signs stable, patient feels fine.  Patient could not leave yesterday as the SNF she was going to had a bed bug outbreak.    Medications  Scheduled Meds:  amLODipine  10 mg Oral Daily   feeding supplement  237 mL Oral BID BM   heparin injection (subcutaneous)  5,000 Units Subcutaneous Q8H   lactulose  30 g Oral BID   OLANZapine  5 mg Oral QHS   mouth rinse  15 mL Mouth Rinse 4 times per day   OXcarbazepine  300 mg Oral QHS   propranolol  10 mg Oral BID   Continuous Infusions: PRN Meds:.acetaminophen **OR** acetaminophen, hydrALAZINE, metoprolol tartrate, ondansetron **OR** ondansetron (ZOFRAN) IV, mouth rinse    Time Spent in minutes   10 minutes   Susa Raring M.D on 12/14/2023 at 9:20 AM  Between 7am to 7pm - Pager - [phone]  After 7pm go to [email or web address] - password [email or web address]  And look for the night coverage person covering for me after hours  [facility] Office  [phone]    Subjective:   Emagene Rossing today has, No headache, No chest pain, No abdominal pain - No Nausea, No new weakness tingling or numbness, No Cough - SOB.  Objective:   Vitals:   12/13/23 0253 12/13/23 2230 12/14/23 0312 12/14/23 0741  BP: 109/71 (!) 107/93 136/89 126/69  Pulse: 66  79 66 68  Resp:   (P) 16 (!) 22  Temp: 99.1 F (37.3 C) 98.5 F (36.9 C) 99.1 F (37.3 C) 98.2 F (36.8 C)  TempSrc: Axillary Oral Oral Oral  SpO2:  97% 98% 96%  Weight:      Height:        Wt Readings from Last 3 Encounters:  12/02/23 59.7 kg  08/11/22 59.7 kg  05/12/22 75 kg     Intake/Output Summary (Last 24 hours) at 12/14/2023 0920 Last data filed at 12/14/2023 0316 Gross per 24 hour  Intake 950 ml  Output 600 ml  Net  350 ml    Exam  Awake Alert, No new F.N deficits, Normal affect [place].AT,PERRAL Supple Neck, No JVD,   Symmetrical Chest wall movement, Good air movement bilaterally, CTAB RRR,No Gallops, Rubs or new Murmurs,  +ve B.Sounds, Abd Soft, No tenderness,   No Cyanosis, Clubbing or edema   Data Review

## 2023-12-14 NOTE — Progress Notes (Signed)
This RN was unable to give patients her meds on time this AM due to lack of assistance repositioning patient in a safe manner  to give meds.

## 2023-12-14 NOTE — TOC Progression Note (Signed)
Transition of Care [facility]) - Progression Note    Patient Details  Name: Kelsa Portwood Tennison MRN: [id number] Date of Birth: 1966/02/29  Transition [facility]) CM/SW Contact  Mearl Latin, LCSW Phone Number: 12/14/2023, 11:49 AM  Clinical Narrative:    [facility] able to accept patient today. CSW will arrange transport.    Expected Discharge Plan: Skilled Nursing Facility Barriers to Discharge: Barriers Resolved  Expected Discharge Plan and Services In-house Referral: Clinical Social Work   Post Acute Care Choice: Skilled Nursing Facility Living arrangements for the past 2 months: Single Family Home Expected Discharge Date: 12/13/23                                     Social Determinants of Health (SDOH) Interventions SDOH Screenings   Alcohol Screen: Low Risk  (11/23/2021)  Tobacco Use: Low Risk  (12/02/2023)    Readmission Risk Interventions     No data to display

## 2023-12-14 NOTE — Plan of Care (Signed)
Pt has rested quietly throughout the night with no distress noted. Alert and oriented to person and place. On room air. SR on the monitor. [place] intact to suction. No complaints voiced.     Problem: Clinical Measurements: Goal: Ability to maintain clinical measurements within normal limits will improve Outcome: Progressing Goal: Respiratory complications will improve Outcome: Progressing   Problem: Nutrition: Goal: Adequate nutrition will be maintained Outcome: Progressing   Problem: Elimination: Goal: Will not experience complications related to urinary retention Outcome: Progressing   Problem: Pain Management: Goal: General experience of comfort will improve Outcome: Progressing

## 2023-12-14 NOTE — TOC Transition Note (Signed)
Transition of Care [facility]) - Discharge Note   Patient Details  Name: Paula Dickson MRN: [id number] Date of Birth: March 15, 1966  Transition of Care [facility]) CM/SW Contact:  Mearl Latin, LCSW Phone Number: 12/14/2023, 2:27 PM   Clinical Narrative:    Patient will DC to: [facility] Anticipated DC date: 12/14/23 Family notified: Sister, [name] Transport by: Sharin Mons   Per MD patient ready for DC to [facility]. RN to call report prior to discharge [phone] room 125a). RN, patient, patient's family, and facility notified of DC. Discharge Summary and FL2 sent to facility. DC packet on chart. Ambulance transport requested for patient.   CSW will sign off for now as social work intervention is no longer needed. Please consult [place] again if new needs arise.     Final next level of care: Skilled Nursing Facility Barriers to Discharge: Barriers Resolved   Patient Goals and CMS Choice Patient states their goals for this hospitalization and ongoing recovery are:: Rehab CMS Medicare.gov Compare Post Acute Care list provided to:: Patient Represenative (must comment) Choice offered to / list presented to : Sibling [place] ownership interest in [facility].provided to:: Sibling    Discharge Placement   Existing PASRR number confirmed : 12/14/23          Patient [facility] bed at: [facility] Patient to be transferred to facility by: [facility] Name of family member notified: Sister Patient and family notified of of transfer: 12/14/23  Discharge Plan and Services Additional resources added to the After Visit Summary for   In-house Referral: Clinical Social Work   Post Acute Care Choice: Skilled Nursing Facility                               Social Drivers of Health (SDOH) Interventions SDOH Screenings   Alcohol Screen: Low Risk  (11/23/2021)  Tobacco Use: Low Risk  (12/02/2023)     Readmission Risk Interventions     No data to display

## 2023-12-24 LAB — CULTURE, FUNGUS WITHOUT SMEAR

## 2024-02-20 ENCOUNTER — Encounter: Payer: Self-pay | Admitting: Neurology

## 2024-02-20 ENCOUNTER — Ambulatory Visit (INDEPENDENT_AMBULATORY_CARE_PROVIDER_SITE_OTHER): Admitting: Neurology

## 2024-02-20 VITALS — BP 131/86 | Resp 15 | Ht 61.0 in | Wt 131.0 lb

## 2024-02-20 DIAGNOSIS — R569 Unspecified convulsions: Secondary | ICD-10-CM | POA: Diagnosis not present

## 2024-02-20 DIAGNOSIS — R251 Tremor, unspecified: Secondary | ICD-10-CM | POA: Diagnosis not present

## 2024-02-20 NOTE — Patient Instructions (Signed)
 Continue current medications including Trileptal Will obtain routine EEG Continue to follow with PCP and return as needed.

## 2024-02-20 NOTE — Progress Notes (Signed)
 [facility]  PATIENT: Paula Dickson DOB: [birth year]  REQUESTING CLINICIAN: Buzzy Han, NP HISTORY FROM: Sister REASON FOR VISIT: Seizure like activity    HISTORICAL  CHIEF COMPLAINT:  Chief Complaint  Patient presents with   New Patient (Initial Visit)    Rm12, sister present,  NP Urgent paper referral for Seizure like activity / Buzzy Han NP [employer] [phone]: last sz was in [place].     HISTORY OF PRESENT ILLNESS:  This is a 58 year old woman with past medical history of schizophrenia, intellectual disability, history of depression, history of UTI who is presenting with her sister for seizure activity.  Sister told me that patient was recently admitted to the [facility] for altered mental status in January, upon discharge, she was taken to [facility].  Over there they called her to tell her that she did have a seizure overnight that was described as generalized shaking, no additional details reported.  Sister tells me that patient has have a history of tremor, but never had a seizure before with her schizophrenia she is already on Trileptal.  Has not had any additional events.     OTHER MEDICAL CONDITIONS: Schizophrenia, Intellectual disability, tremors   REVIEW OF SYSTEMS: Full 14 system review of systems performed and negative with exception of: Unable to fully obtain due to patient intellectual disability   ALLERGIES: No Known Allergies  HOME MEDICATIONS: Outpatient Medications Prior to Visit  Medication Sig Dispense Refill   amLODipine (NORVASC) 10 MG tablet Take 1 tablet (10 mg total) by mouth daily.     benztropine (COGENTIN) 0.5 MG tablet Take 0.5 mg by mouth daily.     lactulose (CHRONULAC) 10 GM/15ML solution Take 45 mLs (30 g total) by mouth daily.     methimazole (TAPAZOLE) 5 MG tablet Take 5 mg by mouth daily.     OLANZapine (ZYPREXA) 5 MG tablet Take 1 tablet (5 mg total) by mouth at bedtime.      Oxcarbazepine (TRILEPTAL) 300 MG tablet Take 1 tablet (300 mg total) by mouth at bedtime.     propranolol (INDERAL) 10 MG tablet Take 1 tablet (10 mg total) by mouth 2 (two) times daily. (Patient taking differently: Take 10 mg by mouth 3 (three) times daily.)     No facility-administered medications prior to visit.    PAST MEDICAL HISTORY: Past Medical History:  Diagnosis Date   Bipolar affective disorder (HCC)    Bipolar disorder (HCC)    History of arthritis    History of chicken pox    History of depression    History of genital warts    history of heart murmur    History of high blood pressure    History of thyroid disease    History of UTI    Hypertension    Low TSH level 07/13/2017   Schizophrenia (HCC)     PAST SURGICAL HISTORY: Past Surgical History:  Procedure Laterality Date   ABLATION ON ENDOMETRIOSIS     CYST REMOVAL NECK     around 11 years ago /benign   MULTIPLE TOOTH EXTRACTIONS      FAMILY HISTORY: Family History  Problem Relation Age of Onset   Arthritis Father    Hyperlipidemia Father    High blood pressure Father    Diabetes Sister    Diabetes Mother    Diabetes Brother    Mental illness Brother    Alcohol abuse Paternal Uncle    Alcohol abuse Paternal Actor  Breast cancer Maternal Aunt    Breast cancer Paternal Aunt    High blood pressure Sister    Mental illness Other        runs in family    SOCIAL HISTORY: Social History   Socioeconomic History   Marital status: Single    Spouse name: Not on file   Number of children: Not on file   Years of education: Not on file   Highest education level: Patient refused  Occupational History   Not on file  Tobacco Use   Smoking status: Never   Smokeless tobacco: Never  Vaping Use   Vaping status: Never Used  Substance and Sexual Activity   Alcohol use: Not Currently   Drug use: Not Currently   Sexual activity: Not Currently  Other Topics Concern   Not on file  Social History  Narrative   ** Merged History Encounter **       Social Drivers of Health   Financial Resource Strain: Not on file  Food Insecurity: Not on file  Transportation Needs: Not on file  Physical Activity: Not on file  Stress: Not on file  Social Connections: Not on file  Intimate Partner Violence: Not on file    PHYSICAL EXAM  GENERAL EXAM/CONSTITUTIONAL: Vitals:  Vitals:   02/20/24 1451 02/20/24 1452  BP: (!) 66/48 131/86  Resp: 15   Weight: 131 lb (59.4 kg)   Height: 5\' 1"  (1.549 m)    Body mass index is 24.75 kg/m. Wt Readings from Last 3 Encounters:  02/20/24 131 lb (59.4 kg)  12/02/23 131 lb 9.8 oz (59.7 kg)  08/11/22 131 lb 9.8 oz (59.7 kg)   Patient is in no distress; well developed, nourished and groomed; neck is supple  MUSCULOSKELETAL: Gait, strength, tone, movements noted in Neurologic exam below  NEUROLOGIC: MENTAL STATUS:      No data to display         awake, alert, oriented to person, not place or time Unable to provide history due to intellectual disability  Able to count the number of quarters in 1 and 2 dollars but not $1.75 Sitting in wheelchair  Able to move all 4 extremities at least antigravity but not ambulatory.    DIAGNOSTIC DATA (LABS, IMAGING, TESTING) - I reviewed patient records, labs, notes, testing and imaging myself where available.  Lab Results  Component Value Date   WBC 12.4 (H) 12/12/2023   HGB 12.1 12/12/2023   HCT 37.3 12/12/2023   MCV 95.6 12/12/2023   PLT 383 12/12/2023      Component Value Date/Time   NA 139 12/12/2023 0513   K 4.3 12/12/2023 0513   CL 101 12/12/2023 0513   CO2 28 12/12/2023 0513   GLUCOSE 120 (H) 12/12/2023 0513   BUN 18 12/12/2023 0513   CREATININE 1.01 (H) 12/12/2023 0513   CALCIUM 9.9 12/12/2023 0513   PROT 6.9 12/12/2023 0513   ALBUMIN 3.3 (L) 12/12/2023 0513   AST 13 (L) 12/12/2023 0513   ALT 24 12/12/2023 0513   ALKPHOS 102 12/12/2023 0513   BILITOT 0.8 12/12/2023 0513    GFRNONAA >60 12/12/2023 0513   GFRAA >60 02/07/2020 1639   Lab Results  Component Value Date   CHOL 127 11/24/2021   HDL 39 (L) 11/24/2021   LDLCALC 56 11/24/2021   TRIG 161 (H) 11/24/2021   Lab Results  Component Value Date   HGBA1C 6.1 (H) 11/24/2021   No results found for: "VITAMINB12" Lab Results  Component Value  Date   TSH 3.325 12/02/2023     ASSESSMENT AND PLAN  58 y.o. year old female  with history of schizophrenia, intellectual disability, depression who is presenting for seizure-like activity described as shaking while she was in a rehab center. Sister tells me that patient has a history of tremor but no history of seizures.  They were not able to provide additional description of the seizures.  Plan will be to obtain routine EEG for background assessment, I will contact them to discuss the result.  Patient is already on oxcarbazepine but for mood/schizophrenia.  Continue to follow with PCP return as needed.   1. Seizure-like activity (HCC)   2. Tremor     Patient Instructions  Continue current medications including Trileptal Will obtain routine EEG Continue to follow with PCP and return as needed.   Per [facility] statutes, patients with seizures are not allowed to drive until they have been seizure-free for six months.  Other recommendations include using caution when using heavy equipment or power tools. Avoid working on ladders or at heights. Take showers instead of baths.  Do not swim alone.  Ensure the water temperature is not too high on the home water heater. Do not go swimming alone. Do not lock yourself in a room alone (i.e. bathroom). When caring for infants or small children, sit down when holding, feeding, or changing them to minimize risk of injury to the child in the event you have a seizure. Maintain good sleep hygiene. Avoid alcohol.  Also recommend adequate sleep, hydration, good diet and minimize stress.   During the Seizure  - First,  ensure adequate ventilation and place patients on the floor on their left side  Loosen clothing around the neck and ensure the airway is patent. If the patient is clenching the teeth, do not force the mouth open with any object as this can cause severe damage - Remove all items from the surrounding that can be hazardous. The patient may be oblivious to what's happening and may not even know what he or she is doing. If the patient is confused and wandering, either gently guide him/her away and block access to outside areas - Reassure the individual and be comforting - Call 911. In most cases, the seizure ends before EMS arrives. However, there are cases when seizures may last over 3 to 5 minutes. Or the individual may have developed breathing difficulties or severe injuries. If a pregnant patient or a person with diabetes develops a seizure, it is prudent to call an ambulance. - Finally, if the patient does not regain full consciousness, then call EMS. Most patients will remain confused for about 45 to 90 minutes after a seizure, so you must use judgment in calling for help. - Avoid restraints but make sure the patient is in a bed with padded side rails - Place the individual in a lateral position with the neck slightly flexed; this will help the saliva drain from the mouth and prevent the tongue from falling backward - Remove all nearby furniture and other hazards from the area - Provide verbal assurance as the individual is regaining consciousness - Provide the patient with privacy if possible - Call for help and start treatment as ordered by the caregiver   After the Seizure (Postictal Stage)  After a seizure, most patients experience confusion, fatigue, muscle pain and/or a headache. Thus, one should permit the individual to sleep. For the next few days, reassurance is essential. Being calm and helping  reorient the person is also of importance.  Most seizures are painless and end spontaneously.  Seizures are not harmful to others but can lead to complications such as stress on the lungs, brain and the heart. Individuals with prior lung problems may develop labored breathing and respiratory distress.    Discussed Patients with epilepsy have a small risk of sudden unexpected death, a condition referred to as sudden unexpected death in epilepsy (SUDEP). SUDEP is defined specifically as the sudden, unexpected, witnessed or unwitnessed, nontraumatic and nondrowning death in patients with epilepsy with or without evidence for a seizure, and excluding documented status epilepticus, in which post mortem examination does not reveal a structural or toxicologic cause for death     Orders Placed This Encounter  Procedures   EEG adult    No orders of the defined types were placed in this encounter.   Return if symptoms worsen or fail to improve.    Windell Norfolk, MD 02/20/2024, 3:32 PM  [facility] [address] [place], [place] [address] [phone]

## 2024-02-26 ENCOUNTER — Ambulatory Visit (INDEPENDENT_AMBULATORY_CARE_PROVIDER_SITE_OTHER): Admitting: Neurology

## 2024-02-26 DIAGNOSIS — R569 Unspecified convulsions: Secondary | ICD-10-CM

## 2024-02-26 NOTE — Procedures (Signed)
    History:  58 year old woman with seizure like activity   EEG classification: Awake and drowsy  Duration: 27 minutes   Technical aspects: This EEG study was done with scalp electrodes positioned according to the 10-20 International system of electrode placement. Electrical activity was reviewed with band pass filter of 1-70Hz , sensitivity of 7 uV/mm, display speed of 87mm/sec with a 60Hz  notched filter applied as appropriate. EEG data were recorded continuously and digitally stored.   Description of the recording: The background rhythms of this recording consists of a fairly well modulated medium amplitude alpha rhythm of 9 Hz that is reactive to eye opening and closure. Present in the anterior head region is a 15-20 Hz beta activity. Photic stimulation was performed, did not show any abnormalities. Hyperventilation was also performed, did not show any abnormalities. Drowsiness was manifested by background fragmentation. No abnormal epileptiform discharges seen during this recording. There was no focal slowing. There were no electrographic seizure identified.   Abnormality: None   Impression: This is a normal awake and drowsy EEG. No evidence of interictal epileptiform discharges. Normal EEGs, however, do not rule out epilepsy.    Windell Norfolk, MD [facility]

## 2024-02-27 ENCOUNTER — Encounter: Payer: Self-pay | Admitting: Neurology

## 2024-06-06 ENCOUNTER — Emergency Department (HOSPITAL_COMMUNITY)
Admission: EM | Admit: 2024-06-06 | Discharge: 2024-06-07 | Disposition: A | Discharge status: Other Acute Inpatient Hospital | Source: Skilled Nursing Facility | Attending: Emergency Medicine | Admitting: Emergency Medicine

## 2024-06-06 ENCOUNTER — Other Ambulatory Visit: Payer: Self-pay

## 2024-06-06 ENCOUNTER — Encounter (HOSPITAL_COMMUNITY): Payer: Self-pay

## 2024-06-06 DIAGNOSIS — Y900 Blood alcohol level of less than 20 mg/100 ml: Secondary | ICD-10-CM | POA: Insufficient documentation

## 2024-06-06 DIAGNOSIS — R4587 Impulsiveness: Secondary | ICD-10-CM | POA: Diagnosis not present

## 2024-06-06 DIAGNOSIS — Z79899 Other long term (current) drug therapy: Secondary | ICD-10-CM | POA: Insufficient documentation

## 2024-06-06 DIAGNOSIS — F314 Bipolar disorder, current episode depressed, severe, without psychotic features: Secondary | ICD-10-CM | POA: Diagnosis not present

## 2024-06-06 DIAGNOSIS — F319 Bipolar disorder, unspecified: Secondary | ICD-10-CM | POA: Diagnosis present

## 2024-06-06 DIAGNOSIS — I10 Essential (primary) hypertension: Secondary | ICD-10-CM | POA: Diagnosis not present

## 2024-06-06 DIAGNOSIS — F3010 Manic episode without psychotic symptoms, unspecified: Secondary | ICD-10-CM

## 2024-06-06 LAB — CBC
HCT: 36.7 % (ref 36.0–46.0)
Hemoglobin: 11.9 g/dL — ABNORMAL LOW (ref 12.0–15.0)
MCH: 27.2 pg (ref 26.0–34.0)
MCHC: 32.4 g/dL (ref 30.0–36.0)
MCV: 84 fL (ref 80.0–100.0)
Platelets: 246 K/uL (ref 150–400)
RBC: 4.37 MIL/uL (ref 3.87–5.11)
RDW: 14.8 % (ref 11.5–15.5)
WBC: 7 K/uL (ref 4.0–10.5)
nRBC: 0 % (ref 0.0–0.2)

## 2024-06-06 LAB — COMPREHENSIVE METABOLIC PANEL WITH GFR
ALT: 24 U/L (ref 0–44)
AST: 21 U/L (ref 15–41)
Albumin: 4.4 g/dL (ref 3.5–5.0)
Alkaline Phosphatase: 95 U/L (ref 38–126)
Anion gap: 13 (ref 5–15)
BUN: 19 mg/dL (ref 6–20)
CO2: 22 mmol/L (ref 22–32)
Calcium: 10.1 mg/dL (ref 8.9–10.3)
Chloride: 105 mmol/L (ref 98–111)
Creatinine, Ser: 1.12 mg/dL — ABNORMAL HIGH (ref 0.44–1.00)
GFR, Estimated: 56 mL/min — ABNORMAL LOW (ref >60)
Glucose, Bld: 133 mg/dL — ABNORMAL HIGH (ref 70–99)
Potassium: 3.6 mmol/L (ref 3.5–5.1)
Sodium: 140 mmol/L (ref 135–145)
Total Bilirubin: 0.8 mg/dL (ref 0.0–1.2)
Total Protein: 7.4 g/dL (ref 6.5–8.1)

## 2024-06-06 LAB — ETHANOL: Alcohol, Ethyl (B): <15 mg/dL (ref <15)

## 2024-06-06 MED ORDER — OXCARBAZEPINE 300 MG PO TABS
450.0000 mg | ORAL_TABLET | Freq: Every day | ORAL | Status: DC
  Administered 2024-06-06: 450 mg via ORAL
  Filled 2024-06-06: qty 1

## 2024-06-06 MED ORDER — LACTULOSE 10 GM/15ML PO SOLN
30.0000 g | Freq: Every day | ORAL | Status: DC
  Administered 2024-06-06: 30 g via ORAL
  Filled 2024-06-06 (×2): qty 45

## 2024-06-06 MED ORDER — OLANZAPINE 5 MG PO TABS
5.0000 mg | ORAL_TABLET | Freq: Every day | ORAL | Status: DC
  Administered 2024-06-06: 5 mg via ORAL
  Filled 2024-06-06: qty 1

## 2024-06-06 MED ORDER — OXCARBAZEPINE 300 MG PO TABS: 300.0000 mg | ORAL_TABLET | Freq: Every day | ORAL | Status: DC

## 2024-06-06 MED ORDER — AMLODIPINE BESYLATE 5 MG PO TABS
10.0000 mg | ORAL_TABLET | Freq: Every day | ORAL | Status: DC
  Administered 2024-06-06: 10 mg via ORAL
  Filled 2024-06-06: qty 2

## 2024-06-06 MED ORDER — METHIMAZOLE 5 MG PO TABS
5.0000 mg | ORAL_TABLET | Freq: Every day | ORAL | Status: DC
  Filled 2024-06-06: qty 1

## 2024-06-06 MED ORDER — PROPRANOLOL HCL 20 MG PO TABS
10.0000 mg | ORAL_TABLET | Freq: Two times a day (BID) | ORAL | Status: DC
  Administered 2024-06-06 (×2): 10 mg via ORAL
  Filled 2024-06-06 (×2): qty 1

## 2024-06-06 MED ORDER — BENZTROPINE MESYLATE 0.5 MG PO TABS
0.5000 mg | ORAL_TABLET | Freq: Every day | ORAL | Status: DC
  Administered 2024-06-06: 0.5 mg via ORAL
  Filled 2024-06-06: qty 1

## 2024-06-06 NOTE — ED Notes (Signed)
 Pt. Refused labs. MD Trifin made aware.

## 2024-06-06 NOTE — Progress Notes (Signed)
 Pt has been accepted to [facility] on 06/06/2024 . Bed assignment:[facility]   Pt meets inpatient criteria per [place] Patient, NP  Attending Physician will be Gordy Poli, MD    Report can be called to: Adult unit: [phone]  Pt can arrive after 8AM  Care Team Notified: Francine Lesches, RN, Efrain Patient, NP

## 2024-06-06 NOTE — ED Triage Notes (Signed)
 Patient BIB [facility] from [facility] facility. Has been manic for 2 weeks. Keeps laughing hysterically and saying I dont know when trying to ask triage questions. Has bipolar and schizophrenia. Staff said compliant with medication but today was her psychotic break.  EMS 5mg  IM Haldol 

## 2024-06-06 NOTE — ED Notes (Signed)
 Pt. Took medication without difficulty but was laughing hysterically the whole time.

## 2024-06-06 NOTE — ED Provider Notes (Signed)
 [place] EMERGENCY DEPARTMENT AT [facility] Provider Note   CSN: [id number] Arrival date & time: 06/06/24  1132     Patient presents with: Manic Behavior   Paula Dickson is a 58 y.o. female.   Patient with history of bipolar disorder, hypertension, schizophrenia presents today from [facility] with concern for manic behavior. Patient extremely difficult historian, throughout interview states I refuse to speak unless you let me look through your cellphone. Says this phrase numerous times. Does deny any complaints currently. Apparently staff at [facility] report she has been compliant with her medications.   Level 5 caveat --- psychiatric disorder  The history is provided by the patient. No language interpreter was used.       Prior to Admission medications   Medication Sig Start Date End Date Taking? Authorizing Provider  amLODipine  (NORVASC ) 10 MG tablet Take 1 tablet (10 mg total) by mouth daily. 12/13/23   Singh, Prashant K, MD  benztropine  (COGENTIN ) 0.5 MG tablet Take 0.5 mg by mouth daily. 02/20/24   [provider]  lactulose  (CHRONULAC ) 10 GM/15ML solution Take 45 mLs (30 g total) by mouth daily. 12/13/23   Singh, Prashant K, MD  methimazole  (TAPAZOLE ) 5 MG tablet Take 5 mg by mouth daily. 02/04/24   [provider]  OLANZapine  (ZYPREXA ) 5 MG tablet Take 1 tablet (5 mg total) by mouth at bedtime. 12/13/23   Singh, Prashant K, MD  Oxcarbazepine  (TRILEPTAL ) 300 MG tablet Take 1 tablet (300 mg total) by mouth at bedtime. 12/13/23   Singh, Prashant K, MD  propranolol  (INDERAL ) 10 MG tablet Take 1 tablet (10 mg total) by mouth 2 (two) times daily. Patient taking differently: Take 10 mg by mouth 3 (three) times daily. 08/16/22   Christobal Guadalajara, MD    Allergies: Patient has no known allergies.    Review of Systems  Unable to perform ROS: Psychiatric disorder    Updated Vital Signs BP 130/76   Pulse 94   Temp 99.2 F (37.3 C) (Oral)   Resp 19    Ht 5' 1 (1.549 m)   Wt 60 kg   SpO2 97%   BMI 24.99 kg/m   Physical Exam Vitals and nursing note reviewed.  Constitutional:      General: She is not in acute distress.    Appearance: Normal appearance. She is normal weight. She is not ill-appearing, toxic-appearing or diaphoretic.  HENT:     Head: Normocephalic and atraumatic.  Cardiovascular:     Rate and Rhythm: Normal rate.  Pulmonary:     Effort: Pulmonary effort is normal. No respiratory distress.  Musculoskeletal:        General: Normal range of motion.     Cervical back: Normal range of motion.  Skin:    General: Skin is warm and dry.  Neurological:     General: No focal deficit present.     Mental Status: She is alert.  Psychiatric:     Comments: Appears to have flight of ideas, however appears fixated on my cell phone throughout the encounter. Does not appear to be responding to internal stimuli     (all labs ordered are listed, but only abnormal results are displayed) Labs Reviewed  CBC - Abnormal; Notable for the following components:      Result Value   Hemoglobin 11.9 (*)    All other components within normal limits  COMPREHENSIVE METABOLIC PANEL WITH GFR  ETHANOL  RAPID URINE DRUG SCREEN, HOSP PERFORMED  URINALYSIS, ROUTINE W  REFLEX MICROSCOPIC    EKG: None  Radiology: No results found.   Procedures   Medications Ordered in the ED - No data to display                                  Medical Decision Making Amount and/or Complexity of Data Reviewed Labs: ordered.   Patient is a 58 y.o. female  who presents to the emergency department for psychiatric complaint.  Past Medical History:  has a past medical history of Bipolar affective disorder (HCC), Bipolar disorder (HCC), History of arthritis, History of chicken pox, History of depression, History of genital warts, history of heart murmur, History of high blood pressure, History of thyroid  disease, History of UTI, Hypertension, Low TSH  level (07/13/2017), and Schizophrenia (HCC).  Physical Exam: Appears to have flight of ideas, however appears fixated on my cell phone throughout the encounter. Does not appear to be responding to internal stimuli.  No other acute physical exam abnormalities  Labs: Medical clearance labs ordered, with following pertinent results: creatinine consistent with previous. UDS pending. No other acute laboratory abnormalities  Cardiac monitoring: EKG obtained and interpreted by attending physician which shows: sinus rhythm, no STEMI  Medications: Home meds ordered, patient received haldol  with EMS  Disposition: Patient is otherwise medically cleared at this time. Will consult TTS and appreciate their recommendations.  Final diagnoses:  Manic behavior [facility])    ED Discharge Orders     None          Paula Dickson 06/06/24 1322    Suzette Pac, MD 06/07/24 [phone]

## 2024-06-06 NOTE — ED Notes (Signed)
 Pt. Has been accepted to [facility] for tomorrow

## 2024-06-06 NOTE — Consult Note (Signed)
 [facility] Psychiatric Consult Initial  Patient Name: .Paula Dickson  MRN: [id number]  DOB: Apr 02, 1966  Consult Order details:  Orders (From admission, onward)     Start     Ordered   06/06/24 1254  CONSULT TO CALL ACT TEAM       Ordering Provider: Nora Lauraine LABOR, PA-C  Provider:  (Not yet assigned)  Question:  Reason for Consult?  Answer:  Psych consult   06/06/24 1253             Mode of Visit: In person    Psychiatry Consult Evaluation  Service Date: June 06, 2024 LOS:  LOS: 0 days  Chief Complaint I don't know. They sent me here.  Primary Psychiatric Diagnoses  Bipolar 1 disorder   Assessment  Paula Dickson is a 58 y.o. female admitted: Presented to the [facility] 06/06/2024 11:35 AM for brought in by EMS from [place] facility for two weeks of manic symptoms . She carries the psychiatric diagnoses of bipolar 1 disorder, schizophrenia, schizoaffective disorder, altered mental status and history of suicide attempt and has a past medical history of HTN, atrial fibrullation, hyperthyroidism, AKI and sinus tachycardia .   Her current presentation of paranoid hyperverbalizations is most consistent with decompensated bipolar 1 disorder. She meets criteria for inpatient hospitalization based on being acutely manic and psychotic.  Current outpatient psychotropic medications include zyprexa , trileptal  and seroquel  and historically she has had a positive response to these medications. She was compliant with medications prior to admission as evidenced by facility report. On initial examination, patient is hyperverbal, paranoid and talking nonsensically. Please see plan below for detailed recommendations.   Diagnoses:  Active Hospital problems: Principal Problem:   Bipolar 1 disorder, depressed, severe (HCC)    Plan   ## Psychiatric Medication Recommendations:  Continue  -- trileptal  450mg  PO Q HS -- zyprexa  5mg  PO Q HS  ## Medical Decision Making Capacity: Not  specifically addressed in this encounter  ## Further Work-up:  -- While pt on Qtc prolonging medications, please monitor & replete K+ to 4 and Mg2+ to 2 -- Plan to order thyroid  labs -- most recent EKG on 06/06/2024 had QtC of 434 -- Pertinent labwork reviewed earlier this admission includes: CBC, CMP, alcohol    ## Disposition:-- We recommend inpatient psychiatric hospitalization when medically cleared. Patient is under voluntary admission status at this time; please IVC if attempts to leave hospital.  ## Behavioral / Environmental: -Utilize compassion and acknowledge the patient's experiences while setting clear and realistic expectations for care.    ## Safety and Observation Level:  - Based on my clinical evaluation, I estimate the patient to be at low risk of self harm in the current setting. - At this time, we recommend  routine. This decision is based on my review of the chart including patient's history and current presentation, interview of the patient, mental status examination, and consideration of suicide risk including evaluating suicidal ideation, plan, intent, suicidal or self-harm behaviors, risk factors, and protective factors. This judgment is based on our ability to directly address suicide risk, implement suicide prevention strategies, and develop a safety plan while the patient is in the clinical setting. Please contact our team if there is a concern that risk level has changed.  CSSR Risk Category:C-SSRS RISK CATEGORY: No Risk  Suicide Risk Assessment: Patient has following modifiable risk factors for suicide: recklessness, active mental illness (to encompass adhd, tbi, mania, psychosis, trauma reaction), and current symptoms: anxiety/panic, insomnia, impulsivity, anhedonia, hopelessness,  which we are addressing by recommending inpatient psychiatric hospitalization. Patient has following non-modifiable or demographic risk factors for suicide: history of suicide attempt and  psychiatric hospitalization Patient has the following protective factors against suicide: Access to outpatient mental health care and Supportive family  Thank you for this consult request. Recommendations have been communicated to the primary team.  We will continue to follow at this time.   Paula Dickson       History of Present Illness  Relevant Aspects of Hospital ED Course:  Admitted on 06/06/2024 for  brought in by EMS from [facility] facility for two weeks of manic symptoms . She carries the psychiatric diagnoses of bipolar 1 disorder, schizophrenia, schizoaffective disorder, altered mental status and history of suicide attempt and has a past medical history of HTN, atrial fibrullation, hyperthyroidism, AKI and sinus tachycardia .   Patient Report:   Paula Dickson, is seen face to face by this provider, consulted with Dr. Larina; and chart reviewed on 06/06/24.  On evaluation Paula Dickson is hyperverbal, paranoid and nonsensical.  She speaks in about many things without obvious connection to one another.  Patient refused to speak to provider until provider lifted foot and allowed patient to see the providers foot.  Patient stated concerned provider could be from [place].  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  The patient also spent significant time focused on topics of a sexual nature and pulled her shirt down to demonstrate how they do.     Patient vacillated between laughter and tearfulness.  She appeared anxious at certain points and confused or joyful at other times. Patient did say clearly that she hasn't been able to sleep.      During evaluation Paula Dickson is laying in bed in  moderate distress.  She is alert & oriented x 2, manic, guarded and attentive for this assessment.  Her mood is mostly anxious with labile affect.  She has rapid / pressured speech, and behavior.  Objectively there is evidence of psychosis/mania and delusional thinking. Pt does appear to be responding to internal or external stimuli.  Patient is not able to converse coherently. He thoughts are disorganized. She denies suicidal/self-harm/homicidal ideation; patient refuses to answer regarding psychosis and paranoia. She appears to be responding to internal stimuli. Patient was unable to answer questions appropriately.   I personally spent a total of 75 minutes in the care of the patient today including preparing to see the patient, getting/reviewing separately obtained history, performing a medically appropriate exam/evaluation, counseling and educating, placing orders, referring and communicating with other health care professionals, documenting clinical information in the EHR, independently interpreting results, and communicating results.   Psych ROS:  Depression: endorses Anxiety:  endorses Mania (lifetime and current): yes Psychosis: (lifetime and current): yes   Review of Systems  Psychiatric/Behavioral:  Positive for hallucinations. The patient has insomnia.   All other systems reviewed and are negative.    Psychiatric and Social History  Psychiatric History:  Information collected from patient and chart review  Prev Dx/Sx: bipolar 1 disorder, schizophrenia, schizoaffective disorder, altered mental status and history of suicide attempt Current Psych Provider: unknown Home  Meds (current): zyprexa , trileptal  and seroquel  Previous Med Trials: lithium  and depakote  Therapy: unknown  Prior Psych Hospitalization: yes, multiple  Prior Self Harm: yes Prior Violence: denies  Family Psych History: brother - mental illness Family Hx suicide: none noted  Social History:  Developmental Hx:  WNL Educational Hx: Associates degree in Business Management Occupational Hx: unknown Legal Hx: none noted Living Situation: recently at [facility] facility Spiritual Hx: yes Access to weapons/lethal means: denies   Substance History Alcohol : denies  Tobacco: denies Illicit drugs: denies Prescription drug abuse: denies Rehab hx: denies  Exam Findings  Physical Exam:  Vital Signs:  Temp:  [99.2 F (37.3 C)] 99.2 F (37.3 C) (07/11 1141) Pulse Rate:  [94] 94 (07/11 1141) Resp:  [19] 19 (07/11 1141) BP: (130)/(76) 130/76 (07/11 1141) SpO2:  [97 %] 97 % (07/11 1141) Weight:  [60 kg] 60 kg (07/11 1141) Blood pressure 130/76, pulse 94, temperature 99.2 F (37.3 C), temperature source Oral, resp. rate 19, height 5' 1 (1.549 m), weight 60 kg, SpO2 97%. Body mass index is 24.99 kg/m.  Physical Exam Vitals and nursing note reviewed.  Eyes:     Pupils: Pupils are equal, round, and reactive to light.  Pulmonary:     Effort: Pulmonary effort is normal.  Skin:    General: Skin is dry.  Neurological:     Mental Status: She is alert.     Comments: Oriented X 2 to self and place  Psychiatric:        Attention and Perception: She is inattentive.        Mood and Affect: Affect is labile and tearful.        Speech: Speech is rapid and pressured.        Behavior: Behavior is cooperative.        Thought Content: Thought content is paranoid and delusional.        Judgment: Judgment is impulsive.     Mental Status Exam: General Appearance: Disheveled  Orientation:  Other:  to person and place  Memory:  UTA  Concentration:  Concentration: Poor  Recall:  UTA  Attention  Poor  Eye Contact:  Good  Speech:  Pressured  Language:  Fair  Volume:  Normal  Mood: Anxious   Affect:  Labile; shevacillated between laughter and tears   Thought Process:  Disorganized  Thought Content:  Illogical, Delusions, and Paranoid Ideation  Suicidal Thoughts:  No  Homicidal Thoughts:  No  Judgement:   Impaired  Insight:  Lacking  Psychomotor Activity:  Restlessness  Akathisia:  No  Fund of Knowledge:  Fair      Assets:  Desire for Improvement Housing Social Support  Cognition:  UTA  ADL's:  Intact  AIMS (if indicated):        Other History   These have been pulled in through the EMR, reviewed, and updated if appropriate.  Family History:  The patient's family history includes Alcohol  abuse in her paternal grandfather and paternal uncle; Arthritis in her father; Breast cancer in her maternal aunt and paternal aunt; Diabetes in her brother, mother, and sister; High blood pressure in her father and sister; Hyperlipidemia in her father; Mental illness in her brother and another family member.  Medical History: Past Medical History:  Diagnosis Date  . Bipolar affective disorder (HCC)   . Bipolar disorder (HCC)   . History of arthritis   . History of chicken pox   . History of depression   . History of genital warts   .  history of heart murmur   . History of high blood pressure   . History of thyroid  disease   . History of UTI   . Hypertension   . Low TSH level 07/13/2017  . Schizophrenia [facility])     Surgical History: Past Surgical History:  Procedure Laterality Date  . ABLATION ON ENDOMETRIOSIS    . CYST REMOVAL NECK     around 11 years ago /benign  . MULTIPLE TOOTH EXTRACTIONS       Medications:   Current Facility-Administered Medications:  .  amLODipine  (NORVASC ) tablet 10 mg, 10 mg, Oral, Daily, Smoot, Sarah A, PA-C, 10 mg at 06/06/24 1530 .  benztropine  (COGENTIN ) tablet 0.5 mg, 0.5 mg, Oral, Daily, Smoot, Sarah A, PA-C, 0.5 mg at 06/06/24 1531 .  lactulose  (CHRONULAC ) 10 GM/15ML solution 30 g, 30 g, Oral, Daily, Smoot, Sarah A, PA-C, 30 g at 06/06/24 1533 .  methimazole  (TAPAZOLE ) tablet 5 mg, 5 mg, Oral, Daily, Smoot, Sarah A, PA-C .  OLANZapine  (ZYPREXA ) tablet 5 mg, 5 mg, Oral, QHS, Smoot, Sarah A, PA-C .  Oxcarbazepine  (TRILEPTAL ) tablet 300 mg, 300 mg,  Oral, QHS, Smoot, Sarah A, PA-C .  propranolol  (INDERAL ) tablet 10 mg, 10 mg, Oral, BID, Smoot, Sarah A, PA-C, 10 mg at 06/06/24 1531  Current Outpatient Medications:  .  acetaminophen  (TYLENOL ) 325 MG tablet, Take 650 mg by mouth every 4 (four) hours as needed (for general discomfort)., Disp: , Rfl:  .  amLODipine  (NORVASC ) 10 MG tablet, Take 1 tablet (10 mg total) by mouth daily., Disp: , Rfl:  .  benztropine  (COGENTIN ) 0.5 MG tablet, Take 0.5 mg by mouth in the morning and at bedtime., Disp: , Rfl:  .  bisacodyl  (DULCOLAX) 10 MG suppository, Place 10 mg rectally daily as needed (for constipation- if no relief from milk of magnesia)., Disp: , Rfl:  .  lactulose  (CHRONULAC ) 10 GM/15ML solution, Take 45 mLs (30 g total) by mouth daily., Disp: , Rfl:  .  methimazole  (TAPAZOLE ) 10 MG tablet, Take 10 mg by mouth in the morning., Disp: , Rfl:  .  MILK OF MAGNESIA 1200 MG/15ML suspension, Take 30 mLs by mouth daily as needed for mild constipation (if no B/M after 3 days)., Disp: , Rfl:  .  OXcarbazepine  (TRILEPTAL ) 150 MG tablet, Take 150 mg by mouth at bedtime., Disp: , Rfl:  .  Oxcarbazepine  (TRILEPTAL ) 300 MG tablet, Take 1 tablet (300 mg total) by mouth at bedtime., Disp: , Rfl:  .  propranolol  (INDERAL ) 10 MG tablet, Take 1 tablet (10 mg total) by mouth 2 (two) times daily. (Patient taking differently: Take 10 mg by mouth in the morning and at bedtime.), Disp: , Rfl:  .  QUEtiapine  (SEROQUEL ) 25 MG tablet, Take 25-75 mg by mouth See admin instructions. Take 25 mg by mouth in the morning and 75 mg at bedtime, Disp: , Rfl:  .  sodium phosphate  (FLEET) ENEM, Place 1 enema rectally daily as needed (for constipation not relieved by bisacodyl  suppository)., Disp: , Rfl:  .  OLANZapine  (ZYPREXA ) 5 MG tablet, Take 1 tablet (5 mg total) by mouth at bedtime. (Patient not taking: Reported on 06/06/2024), Disp: , Rfl:   Allergies: No Known Allergies  Glenetta Kiger A Archer Moist, Dickson

## 2024-06-06 NOTE — ED Notes (Signed)
 Pt. Refused to change clothes.

## 2024-06-06 NOTE — ED Notes (Signed)
 Administrator from [facility] called to check on pt.

## 2024-06-06 NOTE — Progress Notes (Signed)
 Inpatient Psychiatric Referral  Patient was recommended inpatient per Kyra Weber, NP . There are no available beds at [facility] per [facility] Burnard Barter, RN. Patient was referred to the following out of network facilities:  Destination  Service Provider Request Status Address Phone Fax  [facility]  Pending - Request Sent [address] [place] [address] [phone]  [facility]  Pending - Request Sent [address] [place] [address] [phone]  [facility]  Pending - Request Sent [address], [place] [address] [phone]  [facility]  Pending - Request Sent [address] [place] [address] [phone]  [facility]  Pending - Request Sent [address] [place] [phone]  [facility]  Pending - Request Sent [address] [place] [address] [phone]  [facility]  Pending - Request Sent [address] [place] [address] [phone]  [facility]  Pending - Request Sent [address] [place] [address] [phone]  [facility]  Pending - Request Sent [address] [place], [place] [address] [phone]    Situation ongoing, CSW to continue following and update chart as more information becomes available.   Harrie Sofia MSW, LCSWA 06/06/2024  5:30PM

## 2024-06-07 DIAGNOSIS — F314 Bipolar disorder, current episode depressed, severe, without psychotic features: Secondary | ICD-10-CM | POA: Diagnosis not present

## 2024-06-07 LAB — URINALYSIS, ROUTINE W REFLEX MICROSCOPIC
Bacteria, UA: NONE SEEN
Bilirubin Urine: NEGATIVE
Glucose, UA: NEGATIVE mg/dL
Hgb urine dipstick: NEGATIVE
Ketones, ur: NEGATIVE mg/dL
Nitrite: NEGATIVE
Protein, ur: NEGATIVE mg/dL
Specific Gravity, Urine: 1.005 (ref 1.005–1.030)
pH: 7 (ref 5.0–8.0)

## 2024-06-07 LAB — RAPID URINE DRUG SCREEN, HOSP PERFORMED
Amphetamines: NOT DETECTED
Barbiturates: NOT DETECTED
Benzodiazepines: NOT DETECTED
Cocaine: NOT DETECTED
Opiates: NOT DETECTED
Tetrahydrocannabinol: NOT DETECTED

## 2024-06-07 NOTE — ED Notes (Signed)
 Report given to nurse Darice @ [facility].

## 2024-06-07 NOTE — ED Provider Notes (Signed)
 Emergency Medicine Observation Re-evaluation Note  Paula Dickson is a 58 y.o. female, seen on rounds today.  Pt initially presented to the ED for complaints of Manic Behavior Currently, the patient is resting.  Physical Exam  BP (!) 146/94 (BP Location: Right Arm)   Pulse 93   Temp 97.7 F (36.5 C) (Oral)   Resp 15   Ht 5' 1 (1.549 m)   Wt 60 kg   SpO2 100%   BMI 24.99 kg/m  Physical Exam General: NAD   ED Course / MDM  EKG:EKG Interpretation Date/Time:  Friday June 06 2024 12:04:40 EDT Ventricular Rate:  90 PR Interval:  178 QRS Duration:  91 QT Interval:  354 QTC Calculation: 434 R Axis:   -5  Text Interpretation: Sinus rhythm Consider left atrial enlargement Left ventricular hypertrophy Borderline T abnormalities, diffuse leads Confirmed by Cottie Cough [phone]) on 06/06/2024 6:39:58 PM  I have reviewed the labs performed to date as well as medications administered while in observation.  Recent changes in the last 24 hours include no acute events reported.  Plan  Current plan is for placement.  Patient accepted at [name].  Accepting is Dr Gordy Poli.  Anticipated transfer to occur today.Paula Laurice Maude JAYSON, MD 06/07/24 [phone]

## 2024-06-07 NOTE — ED Notes (Signed)
Safe transport called 

## 2024-06-17 ENCOUNTER — Other Ambulatory Visit: Payer: Self-pay

## 2024-06-17 ENCOUNTER — Encounter (HOSPITAL_COMMUNITY): Payer: Self-pay

## 2024-06-17 ENCOUNTER — Emergency Department (HOSPITAL_COMMUNITY)
Admission: EM | Admit: 2024-06-17 | Discharge: 2024-06-19 | Disposition: A | Discharge status: Other Acute Inpatient Hospital | Attending: Emergency Medicine | Admitting: Emergency Medicine

## 2024-06-17 DIAGNOSIS — I10 Essential (primary) hypertension: Secondary | ICD-10-CM | POA: Insufficient documentation

## 2024-06-17 DIAGNOSIS — F250 Schizoaffective disorder, bipolar type: Secondary | ICD-10-CM | POA: Diagnosis present

## 2024-06-17 DIAGNOSIS — F319 Bipolar disorder, unspecified: Secondary | ICD-10-CM | POA: Diagnosis present

## 2024-06-17 DIAGNOSIS — Z79899 Other long term (current) drug therapy: Secondary | ICD-10-CM | POA: Diagnosis not present

## 2024-06-17 DIAGNOSIS — F3010 Manic episode without psychotic symptoms, unspecified: Secondary | ICD-10-CM

## 2024-06-17 DIAGNOSIS — E039 Hypothyroidism, unspecified: Secondary | ICD-10-CM | POA: Insufficient documentation

## 2024-06-17 DIAGNOSIS — Z7901 Long term (current) use of anticoagulants: Secondary | ICD-10-CM | POA: Insufficient documentation

## 2024-06-17 LAB — CBC WITH DIFFERENTIAL/PLATELET
Abs Immature Granulocytes: 0.02 K/uL (ref 0.00–0.07)
Basophils Absolute: 0.1 K/uL (ref 0.0–0.1)
Basophils Relative: 1 %
Eosinophils Absolute: 0.1 K/uL (ref 0.0–0.5)
Eosinophils Relative: 2 %
HCT: 33.9 % — ABNORMAL LOW (ref 36.0–46.0)
Hemoglobin: 11 g/dL — ABNORMAL LOW (ref 12.0–15.0)
Immature Granulocytes: 0 %
Lymphocytes Relative: 36 %
Lymphs Abs: 2.5 K/uL (ref 0.7–4.0)
MCH: 27.8 pg (ref 26.0–34.0)
MCHC: 32.4 g/dL (ref 30.0–36.0)
MCV: 85.8 fL (ref 80.0–100.0)
Monocytes Absolute: 0.6 K/uL (ref 0.1–1.0)
Monocytes Relative: 9 %
Neutro Abs: 3.6 K/uL (ref 1.7–7.7)
Neutrophils Relative %: 52 %
Platelets: 229 K/uL (ref 150–400)
RBC: 3.95 MIL/uL (ref 3.87–5.11)
RDW: 14.2 % (ref 11.5–15.5)
WBC: 7 K/uL (ref 4.0–10.5)
nRBC: 0 % (ref 0.0–0.2)

## 2024-06-17 LAB — URINALYSIS, ROUTINE W REFLEX MICROSCOPIC
Bacteria, UA: NONE SEEN
Bilirubin Urine: NEGATIVE
Glucose, UA: NEGATIVE mg/dL
Hgb urine dipstick: NEGATIVE
Ketones, ur: NEGATIVE mg/dL
Nitrite: NEGATIVE
Protein, ur: NEGATIVE mg/dL
Specific Gravity, Urine: 1.008 (ref 1.005–1.030)
pH: 6 (ref 5.0–8.0)

## 2024-06-17 LAB — COMPREHENSIVE METABOLIC PANEL WITH GFR
ALT: 18 U/L (ref 0–44)
AST: 19 U/L (ref 15–41)
Albumin: 3.5 g/dL (ref 3.5–5.0)
Alkaline Phosphatase: 77 U/L (ref 38–126)
Anion gap: 9 (ref 5–15)
BUN: 24 mg/dL — ABNORMAL HIGH (ref 6–20)
CO2: 25 mmol/L (ref 22–32)
Calcium: 9.5 mg/dL (ref 8.9–10.3)
Chloride: 103 mmol/L (ref 98–111)
Creatinine, Ser: 1.19 mg/dL — ABNORMAL HIGH (ref 0.44–1.00)
GFR, Estimated: 52 mL/min — ABNORMAL LOW (ref >60)
Glucose, Bld: 114 mg/dL — ABNORMAL HIGH (ref 70–99)
Potassium: 4 mmol/L (ref 3.5–5.1)
Sodium: 137 mmol/L (ref 135–145)
Total Bilirubin: 0.6 mg/dL (ref 0.0–1.2)
Total Protein: 6.5 g/dL (ref 6.5–8.1)

## 2024-06-17 LAB — RAPID URINE DRUG SCREEN, HOSP PERFORMED
Amphetamines: NOT DETECTED
Barbiturates: NOT DETECTED
Benzodiazepines: NOT DETECTED
Cocaine: NOT DETECTED
Opiates: NOT DETECTED
Tetrahydrocannabinol: NOT DETECTED

## 2024-06-17 LAB — SALICYLATE LEVEL: Salicylate Lvl: <7 mg/dL — ABNORMAL LOW (ref 7.0–30.0)

## 2024-06-17 LAB — ACETAMINOPHEN LEVEL: Acetaminophen (Tylenol), Serum: <10 ug/mL — ABNORMAL LOW (ref 10–30)

## 2024-06-17 LAB — TSH: TSH: 0.048 u[IU]/mL — ABNORMAL LOW (ref 0.350–4.500)

## 2024-06-17 LAB — T4, FREE: Free T4: 0.86 ng/dL (ref 0.61–1.12)

## 2024-06-17 LAB — ETHANOL: Alcohol, Ethyl (B): <15 mg/dL (ref <15)

## 2024-06-17 MED ORDER — OXCARBAZEPINE 150 MG PO TABS
450.0000 mg | ORAL_TABLET | Freq: Every day | ORAL | Status: DC
  Administered 2024-06-18: 450 mg via ORAL
  Filled 2024-06-17 (×2): qty 3

## 2024-06-17 MED ORDER — AMLODIPINE BESYLATE 5 MG PO TABS
10.0000 mg | ORAL_TABLET | Freq: Every day | ORAL | Status: DC
  Administered 2024-06-19: 10 mg via ORAL
  Filled 2024-06-17 (×2): qty 2

## 2024-06-17 MED ORDER — METHIMAZOLE 10 MG PO TABS
10.0000 mg | ORAL_TABLET | Freq: Every morning | ORAL | Status: DC
  Administered 2024-06-18: 10 mg via ORAL
  Filled 2024-06-17 (×2): qty 1

## 2024-06-17 MED ORDER — PROPRANOLOL HCL 10 MG PO TABS
10.0000 mg | ORAL_TABLET | Freq: Two times a day (BID) | ORAL | Status: DC
  Administered 2024-06-18: 10 mg via ORAL
  Filled 2024-06-17 (×4): qty 1

## 2024-06-17 MED ORDER — MAGNESIUM HYDROXIDE 400 MG/5ML PO SUSP: 30.0000 mL | Freq: Every day | ORAL | Status: DC | PRN

## 2024-06-17 MED ORDER — BISACODYL 10 MG RE SUPP: 10.0000 mg | Freq: Every day | RECTAL | Status: DC | PRN

## 2024-06-17 MED ORDER — BENZTROPINE MESYLATE 1 MG PO TABS
0.5000 mg | ORAL_TABLET | Freq: Two times a day (BID) | ORAL | Status: DC
  Administered 2024-06-18 – 2024-06-19 (×2): 0.5 mg via ORAL
  Filled 2024-06-17 (×3): qty 1

## 2024-06-17 MED ORDER — ZIPRASIDONE MESYLATE 20 MG IM SOLR
20.0000 mg | Freq: Once | INTRAMUSCULAR | Status: AC
  Administered 2024-06-17: 20 mg via INTRAMUSCULAR
  Filled 2024-06-17: qty 20

## 2024-06-17 MED ORDER — LACTULOSE 10 GM/15ML PO SOLN
30.0000 g | Freq: Every day | ORAL | Status: DC
  Filled 2024-06-17: qty 60

## 2024-06-17 MED ORDER — OXCARBAZEPINE 300 MG PO TABS: 300.0000 mg | ORAL_TABLET | Freq: Every day | ORAL | Status: DC

## 2024-06-17 MED ORDER — OLANZAPINE 5 MG PO TABS
5.0000 mg | ORAL_TABLET | Freq: Every day | ORAL | Status: DC
  Filled 2024-06-17: qty 1

## 2024-06-17 NOTE — ED Notes (Signed)
 PT THREW HER PO MED AT THIS NURSE. STARTING TO BECOME VIOLENT. MD NOTIFIED

## 2024-06-17 NOTE — BH Assessment (Signed)
 Clinician checked in with Paula Asp, RN is the pt was able to engage. Clinician noted per chart the pt was administered Geodon . Pt's RN reports, pt is asleep, she will contact TTS once pt is able to engage.    [name], MS, [facility], [facility] Triage Specialist [phone]

## 2024-06-17 NOTE — ED Provider Notes (Signed)
 [place] EMERGENCY DEPARTMENT AT [facility] Provider Note   CSN: [id number] Arrival date & time: 06/17/24  1743     Patient presents with: Manic Behavior   Paula Dickson is a 58 y.o. female.   HPI   58 year old female with medical history significant for bipolar disorder, schizo nephronia, prior suicide attempts, HTN, atrial fibrillation, hypothyroidism, presenting to the Emergency Department from her facility for concern for mania.  The patient had rapid pressured speech.  Was reportedly given an unknown amount of Xanax at her facility prior to EMS arrival according to EMS.  She presents from [place] facility for rapid pressured speech that started 2 hours after returning to the facility from a [facility] acute psychiatric facility.  Patient has been aggressive towards residents and threatened staff, per trade manic behavior with associated delusions.  Patient states on arrival they are trying to kill me but then will not elaborate.  She denies SI, HI or AVH but appears to have rapid pressured speech despite the Xanax that was given to her.  She exhibits paranoia. She stated to nursing that she is pregnant and then endorsed hyperreligiosity.  Prior to Admission medications   Medication Sig Start Date End Date Taking? Authorizing Provider  acetaminophen  (TYLENOL ) 325 MG tablet Take 650 mg by mouth every 4 (four) hours as needed (for general discomfort).    [provider]  amLODipine  (NORVASC ) 10 MG tablet Take 1 tablet (10 mg total) by mouth daily. 12/13/23   Singh, Prashant K, MD  benztropine  (COGENTIN ) 0.5 MG tablet Take 0.5 mg by mouth in the morning and at bedtime. 02/20/24   [provider]  bisacodyl  (DULCOLAX) 10 MG suppository Place 10 mg rectally daily as needed (for constipation- if no relief from milk of magnesia).    [provider]  lactulose  (CHRONULAC ) 10 GM/15ML solution Take 45 mLs (30 g total) by mouth daily. 12/13/23   Dennise Lavada POUR, MD  methimazole  (TAPAZOLE ) 10 MG tablet Take 10 mg by mouth in the morning.    [provider]  MILK OF MAGNESIA 1200 MG/15ML suspension Take 30 mLs by mouth daily as needed for mild constipation (if no B/M after 3 days).    [provider]  OLANZapine  (ZYPREXA ) 5 MG tablet Take 1 tablet (5 mg total) by mouth at bedtime. Patient not taking: Reported on 06/06/2024 12/13/23   Singh, Prashant K, MD  OXcarbazepine  (TRILEPTAL ) 150 MG tablet Take 150 mg by mouth at bedtime.    [provider]  Oxcarbazepine  (TRILEPTAL ) 300 MG tablet Take 1 tablet (300 mg total) by mouth at bedtime. 12/13/23   Dennise Lavada POUR, MD  propranolol  (INDERAL ) 10 MG tablet Take 1 tablet (10 mg total) by mouth 2 (two) times daily. Patient taking differently: Take 10 mg by mouth in the morning and at bedtime. 08/16/22   Christobal Guadalajara, MD  QUEtiapine  (SEROQUEL ) 25 MG tablet Take 25-75 mg by mouth See admin instructions. Take 25 mg by mouth in the morning and 75 mg at bedtime    [provider]  sodium phosphate  (FLEET) ENEM Place 1 enema rectally daily as needed (for constipation not relieved by bisacodyl  suppository).    [provider]    Allergies: Patient has no known allergies.    Review of Systems  Unable to perform ROS: Psychiatric disorder    Updated Vital Signs BP 110/66   Pulse 100   Temp 98.2 F (36.8 C) (Axillary)   Resp 15  Ht 5' 1 (1.549 m)   Wt 57.1 kg   SpO2 100%   BMI 23.79 kg/m   Physical Exam Vitals and nursing note reviewed.  Constitutional:      General: She is not in acute distress. HENT:     Head: Normocephalic and atraumatic.  Eyes:     Conjunctiva/sclera: Conjunctivae normal.     Pupils: Pupils are equal, round, and reactive to light.  Cardiovascular:     Rate and Rhythm: Regular rhythm. Tachycardia present.  Pulmonary:     Effort: Pulmonary effort is normal. No respiratory distress.  Abdominal:     General: There is no  distension.     Tenderness: There is no guarding.  Musculoskeletal:        General: No deformity or signs of injury.     Cervical back: Neck supple.  Skin:    Findings: No lesion or rash.  Neurological:     General: No focal deficit present.     Mental Status: She is alert. Mental status is at baseline.  Psychiatric:        Attention and Perception: She is inattentive.        Speech: Speech is rapid and pressured and tangential.        Behavior: Behavior is hyperactive.        Thought Content: Thought content is paranoid and delusional. Thought content does not include homicidal or suicidal ideation.     (all labs ordered are listed, but only abnormal results are displayed) Labs Reviewed  COMPREHENSIVE METABOLIC PANEL WITH GFR - Abnormal; Notable for the following components:      Result Value   Glucose, Bld 114 (*)    BUN 24 (*)    Creatinine, Ser 1.19 (*)    GFR, Estimated 52 (*)    All other components within normal limits  CBC WITH DIFFERENTIAL/PLATELET - Abnormal; Notable for the following components:   Hemoglobin 11.0 (*)    HCT 33.9 (*)    All other components within normal limits  SALICYLATE LEVEL - Abnormal; Notable for the following components:   Salicylate Lvl <7.0 (*)    All other components within normal limits  ACETAMINOPHEN  LEVEL - Abnormal; Notable for the following components:   Acetaminophen  (Tylenol ), Serum <10 (*)    All other components within normal limits  URINALYSIS, ROUTINE W REFLEX MICROSCOPIC - Abnormal; Notable for the following components:   Leukocytes,Ua SMALL (*)    All other components within normal limits  TSH - Abnormal; Notable for the following components:   TSH 0.048 (*)    All other components within normal limits  ETHANOL  RAPID URINE DRUG SCREEN, HOSP PERFORMED  T4, FREE    EKG: EKG Interpretation Date/Time:  Tuesday June 17 2024 18:23:05 EDT Ventricular Rate:  100 PR Interval:  173 QRS Duration:  79 QT Interval:  343 QTC  Calculation: 443 R Axis:   10  Text Interpretation: Sinus tachycardia Low voltage, precordial leads Abnormal R-wave progression, early transition Borderline T abnormalities, anterior leads Confirmed by Jerrol Agent ([id number]) on 06/17/2024 7:34:38 PM  Radiology: No results found.   .Critical Care  Performed by: Jerrol Agent, MD Authorized by: Jerrol Agent, MD   Critical care provider statement:    Critical care time (minutes):  30   Critical care was time spent personally by me on the following activities:  Development of treatment plan with patient or surrogate, discussions with consultants, evaluation of patient's response to treatment, examination of patient, ordering and  review of laboratory studies, ordering and review of radiographic studies, ordering and performing treatments and interventions, pulse oximetry, re-evaluation of patient's condition and review of old charts    Medications Ordered in the ED  ziprasidone  (GEODON ) injection 20 mg (20 mg Intramuscular Given 06/17/24 2046)                                    Medical Decision Making Amount and/or Complexity of Data Reviewed Labs: ordered.  Risk Prescription drug management.   58 year old female with medical history significant for bipolar disorder, schizo nephronia, prior suicide attempts, HTN, atrial fibrillation, hypothyroidism, presenting to the Emergency Department from her facility for concern for mania.  The patient had rapid pressured speech.  Was reportedly given an unknown amount of Xanax at her facility prior to EMS arrival according to EMS.  She presents from [place] facility for rapid pressured speech that started 2 hours after returning to the facility from a [facility] acute psychiatric facility.  Patient has been aggressive towards residents and threatened staff, per trade manic behavior with associated delusions.  Patient states on arrival they are trying to kill me but then will not elaborate.  She denies SI,  HI or AVH but appears to have rapid pressured speech despite the Xanax that was given to her.  She exhibits paranoia. She stated to nursing that she is pregnant and then endorsed hyperreligiosity.    On arrival, the patient was mildly somnolent after Xanax administration but still endorsed paranoid delusions, had rapid pressured speech and appears to be exhibiting manic behavior.  IVC paperwork completed.  Screening laboratory evaluation performed, Tylenol , 6 folate, ethanol level normal, CBC without a leukocytosis, TSH low at 0.048, Free T4 normal.  CMP generally unremarkable, UA and UDS pending. EKG revealed sinus tachycardia, borderline T wave changes, no STEMI.  Patient was administered oral Zyprexa .  TTS consulted.   I was notified that the patient threw her Zyprexa  at staff and subsequently be poor and more agitated and uncooperative.  She was administered 20 mg of IM Geodon .  Patient at this time is medically cleared for psychiatric consultation.     Final diagnoses:  Manic behavior [facility])    ED Discharge Orders     None          Jerrol Agent, MD 06/17/24 2140

## 2024-06-17 NOTE — ED Triage Notes (Signed)
 BIB [facility] from [place] facility for manic episode that started 2 hours after returning to the facility from a psychiatric facility. According to staff patient has been aggressive towards residents and threatened staff, patient portrayed manic behavior and delusions, Patient given unknown amount of xanax at facility.   BP 136/74 HR 108 RR 22 98% RA CBG 115

## 2024-06-17 NOTE — ED Notes (Signed)
 [id number]

## 2024-06-17 NOTE — ED Notes (Signed)
 Patient changed out in blue scrubs urine collected. Patient tolerated well.

## 2024-06-17 NOTE — ED Notes (Signed)
 Patient up walking out of the room, talking excessively. Patient was offered food and fluids they were given. Patient thinks she is pregnant.

## 2024-06-17 NOTE — ED Notes (Signed)
 Patient denies pain and is resting comfortably.

## 2024-06-17 NOTE — ED Notes (Signed)
 Called MAG@ 2120, [employer] has retrieved IVC paperwork and should be in route.

## 2024-06-17 NOTE — ED Notes (Signed)
 IVC received per [place] @ 1955 FE& AFF Scanned @ 2010 [facility] not responding @ 2011 [facility] file completed @ [address] @ 2018- was placed on hold, informed that they are very busy and to expect a delay on the findings but she ensured she would get to me as soon as possible. Envelope [id number] was provided.

## 2024-06-17 NOTE — ED Notes (Signed)
 SECURITY CALLED TO DE-ESCALATE PT WHILE THIS NURSE ADMINISTERED GEODON 

## 2024-06-17 NOTE — ED Notes (Signed)
 Called [employer] non emergent line to find ETA of findings & custody, was advised @2214  that there were no [employer] officers in the area to serve the pprwrk, ETA -TBD by [employer] coverage. Called Magistrate @1016  to gain clarity and thei informed me that [employer] had picked it up 5 mins prior to 2120 when I last called. Will recall non-emergent line to investigate further.

## 2024-06-18 DIAGNOSIS — F319 Bipolar disorder, unspecified: Secondary | ICD-10-CM

## 2024-06-18 MED ORDER — LORAZEPAM 1 MG PO TABS
1.0000 mg | ORAL_TABLET | ORAL | Status: AC | PRN
  Administered 2024-06-19: 1 mg via ORAL
  Filled 2024-06-18: qty 1

## 2024-06-18 MED ORDER — ZIPRASIDONE MESYLATE 20 MG IM SOLR
20.0000 mg | INTRAMUSCULAR | Status: AC | PRN
  Administered 2024-06-18: 20 mg via INTRAMUSCULAR
  Filled 2024-06-18: qty 20

## 2024-06-18 MED ORDER — QUETIAPINE FUMARATE ER 50 MG PO TB24
50.0000 mg | ORAL_TABLET | Freq: Every day | ORAL | Status: DC
  Administered 2024-06-18: 50 mg via ORAL
  Filled 2024-06-18: qty 1

## 2024-06-18 MED ORDER — QUETIAPINE FUMARATE 25 MG PO TABS
25.0000 mg | ORAL_TABLET | Freq: Every day | ORAL | Status: DC
  Administered 2024-06-18 – 2024-06-19 (×2): 25 mg via ORAL
  Filled 2024-06-18 (×2): qty 1

## 2024-06-18 MED ORDER — OLANZAPINE 5 MG PO TBDP
10.0000 mg | ORAL_TABLET | Freq: Three times a day (TID) | ORAL | Status: DC | PRN
  Administered 2024-06-18: 10 mg via ORAL
  Filled 2024-06-18: qty 2

## 2024-06-18 NOTE — Progress Notes (Signed)
 Inpatient Psychiatric Referral  Patient was recommended inpatient per Elveria Batter, NP. There are no available beds at [facility], per [facility] Cherylynn Ernst, RN. Patient was referred to the following out of network facilities:  Destination  Service Provider Request Status Address Phone Fax  [facility]  Pending - Request Sent [address], [place] [address] [phone]  [facility]  Pending - Request Sent [address] [place] [address] [phone]  [facility]  Pending - Request Sent [address] [place] [address] [phone]  [facility]  Pending - Request Sent [address] [place] [address] [phone]  [facility]  Pending - Request Sent [address] [place] [address] [phone]  [facility]  Pending - Request Sent [address], [place] [address] [phone]  [facility]  Pending - Request Sent [address] [place] [address] [place] [address] [phone]  [facility]  Pending - Request Sent [address] [place] [phone]  [facility]  Pending - Request Sent [address] [place] [address] [phone]    Situation ongoing, CSW to continue following and update chart as more information becomes available.   Harrie Sofia MSW, LCSWA 06/18/2024  8:13PM

## 2024-06-18 NOTE — ED Notes (Signed)
 PT IN THE RESTROOM

## 2024-06-18 NOTE — ED Notes (Signed)
 PT GIVEN ANOTHER DRINK, CRACKERS, AND A BLANKET

## 2024-06-18 NOTE — ED Notes (Signed)
 PT GIVEN JUICE, APPLE SAUCE AND A BLANKET

## 2024-06-18 NOTE — ED Notes (Signed)
 PT STARTED TO BECOME AGITATED, MD MADE AWARE. PRNS ORDERED. MEDS GIVEN

## 2024-06-18 NOTE — Consult Note (Addendum)
 [facility] Psychiatric Consult Initial  Patient Name: .Paula Dickson  MRN: [id number]  DOB: 1966-01-16  Consult Order details:  Orders (From admission, onward)     Start     Ordered   06/17/24 1936  CONSULT TO CALL ACT TEAM       Ordering Provider: Jerrol Agent, MD  Provider:  (Not yet assigned)  Question:  Reason for Consult?  Answer:  Manic behavior   06/17/24 1935           Mode of Visit: In person    Psychiatry Consult Evaluation  Service Date: June 18, 2024 LOS:  LOS: 0 days  Chief Complaint I don't know. They sent me here.  Primary Psychiatric Diagnoses  Bipolar 1 disorder   Assessment  Paula Dickson is a 58 y.o. female admitted: Presented to the EDfor 06/17/2024  5:43 PM for DIB by [facility] from [facility] facility after being discharged from her psychiatric admission at [facility].  Patient refused to go in at that time and police were called.  She carries the psychiatric diagnoses of bipolar 1 disorder, schizophrenia, schizoaffective disorder, altered mental status and history of suicide attempt and has a past medical history of HTN, atrial fibrullation, hyperthyroidism, AKI and sinus tachycardia .   Her current presentation of paranoid, delusions, disorganized thought, tangential, hyperverbalizations is most consistent with decompensated Schizoaffective disorder Bipolar type. She meets criteria for inpatient hospitalization based on being acutely manic and psychotic.  Current outpatient psychotropic medications include zyprexa , trileptal  and cogentin  and historically she has had a positive response to these medications. She was compliant with medications prior to admission as evidenced by facility report.   On initial examination, patient is alert to self, place, date, but disoriented why she was brought to the hospital.  Her speech is clear, coherent, at a normal rate and tone.  However she is hyperverbal and tangential in her thought process.  She is  difficult to redirect in conversation as she wants to finish getting her thoughts out.  She states that this provider many times shhhhhhh.  She believes that her brother and sister are doing her wrong and they are the reason that she has come to the hospital.  She believes it is because she is pregnant and they called her a whore.  Then states, I do look at porn.  She is disorganized.  She is paranoid and delusional.  She believes that she works in [employer] and helps those with equal rights.  When asked if she is feeling depressed she states, heck no.  And states, I am anxious for nothing.  She endorses auditory hallucinations of hearing God talk to her.  She is hyperreligious.  She has visual hallucinations where she believes that she has seen dead people and thinks that she hears people in her house throughout the night.  She does not appear to be responding to internal/external stimuli.  However she is easily distracted.  She denies any concerns with appetite or sleep. She denies SI/HI.   She does not want to take the Zyprexa  that has been ordered but states she will take Seroquel  or lithium  if it is ordered.  She does not believe that she needs to be in a psychiatric hospital.  Please see plan below for detailed recommendations.    Call contacted Samuel Rittenhouse  (sister) [phone]    Acting out for the past couple of weeks. [facility] [place] got back yesterday and showed out. She was angry, refused  to go with EMS cause they looked like [employer]. Sister tried to [facility] but was denied. [facility] sent in for assessment.  Baseline is off the wall, making up stuff. Baseline is hyper religious. Has been  residing in [place] been there since May.  [facility] and spoke with provider where [place].  States patient has been in a manic state for about the past 4-6 weeks.  They have made multiple attempts to adjust medications but have not been successful.   She was sent to [facility] for psychiatric admission.  They were told by [facility] that on her 10th day that she would need to be discharged.  DO EN went and visited patient and she was not at her baseline but patient was discharged anyway.  Once they arrived home patient did not want to go into the facility and she also refused to go to the hospital.  Reports patient had word salad, very paranoid, she made statements that she thought they would send her to the morgue.  She is not sleeping for days at the time while she was in the facility.  She was told while patient was at [facility] that she was not sleeping in their facility either.  Reports patient's baseline is alert/oriented x 4.  Her baseline is not paranoid or delusional.  She is typically not word salad/hyperverbal nor is she hyperreligious at baseline.  Facility is willing to take patient back after she is completed a psychiatric admission.  They state patient is a good resident and typically has no problems at her when she is not in a manic state.    Diagnoses:  Active Hospital problems: Principal Problem:   Schizoaffective disorder, bipolar type (HCC)    Plan   ## Psychiatric Medication Recommendations:  Continue  -- trileptal  450mg  PO Q HS -- d/c zyprexa  5mg  PO Q HS - cogentin  0.5 mg qhs -start Seroquel  25 mg daily and Seroquel  50 qhs  ## Medical Decision Making Capacity: Not specifically addressed in this encounter  ## Further Work-up:  -- No further recommendations at this time -- most recent EKG on 06/17/2024 had QtC of 443 -- Pertinent labwork reviewed earlier this admission includes: CBC, CMP, TSH U/A and UDS   ## Disposition:-- We recommend inpatient psychiatric hospitalization when medically cleared. Patient is under voluntary admission status at this time; please IVC if attempts to leave hospital.  ## Behavioral / Environmental: -Utilize compassion and acknowledge the patient's experiences while  setting clear and realistic expectations for care.    ## Safety and Observation Level:  - Based on my clinical evaluation, I estimate the patient to be at low risk of self harm in the current setting. - At this time, we recommend  routine. This decision is based on my review of the chart including patient's history and current presentation, interview of the patient, mental status examination, and consideration of suicide risk including evaluating suicidal ideation, plan, intent, suicidal or self-harm behaviors, risk factors, and protective factors. This judgment is based on our ability to directly address suicide risk, implement suicide prevention strategies, and develop a safety plan while the patient is in the clinical setting. Please contact our team if there is a concern that risk level has changed.  CSSR Risk Category:C-SSRS RISK CATEGORY: No Risk  Suicide Risk Assessment: Patient has following modifiable risk factors for suicide: recklessness, active mental illness (to encompass adhd, tbi, mania, psychosis, trauma reaction), and current symptoms: anxiety/panic, insomnia, impulsivity, anhedonia, hopelessness, which  we are addressing by recommending inpatient psychiatric hospitalization. Patient has following non-modifiable or demographic risk factors for suicide: history of suicide attempt and psychiatric hospitalization Patient has the following protective factors against suicide: Access to outpatient mental health care and Supportive family  Thank you for this consult request. Recommendations have been communicated to the primary team.  We will continue to follow at this time.   Elveria VEAR Batter, NP       History of Present Illness  Relevant Aspects of Hospital ED Course:  Admitted on 06/17/2024 for  brought in by EMS from [facility] facility after being discharged from [facility] from her recent psychiatric admission.  She carries the psychiatric diagnoses of bipolar 1 disorder,  schizophrenia, schizoaffective disorder, altered mental status and history of suicide attempt and has a past medical history of HTN, atrial fibrullation, hyperthyroidism, AKI and sinus tachycardia .   Patient Report:   I believe in equal rights  Dr. Jerrol MD on admission,  58 year old female with medical history significant for bipolar disorder, schizo nephronia, prior suicide attempts, HTN, atrial fibrillation, hypothyroidism, presenting to the Emergency Department from her facility for concern for mania.  The patient had rapid pressured speech.  Was reportedly given an unknown amount of Xanax at her facility prior to EMS arrival according to EMS.  She presents from [place] facility for rapid pressured speech that started 2 hours after returning to the facility from a 5 acute psychiatric facility.  Patient has been aggressive towards residents and threatened staff, per trade manic behavior with associated delusions.  Patient states on arrival they are trying to kill me but then will not elaborate.  She denies SI, HI or AVH but appears to have rapid pressured speech despite the Xanax that was given to her.  She exhibits paranoia. She stated to nursing that she is pregnant and then endorsed hyperreligiosity.   Triage RN note, BIB [facility] from [facility] facility for manic episode that started 2 hours after returning to the facility from a psychiatric facility. According to staff patient has been aggressive towards residents and threatened staff, patient portrayed manic behavior and delusions, Patient given unknown amount of xanax at facility    Psych ROS:  Depression: heck to the  no Anxiety:  heck to the no Mania (lifetime and current): yes Psychosis: (lifetime and current): yes   Review of Systems  Psychiatric/Behavioral:  Positive for hallucinations. The patient has insomnia.   All other systems reviewed and are negative.    Psychiatric and Social History  Psychiatric  History:  Information collected from patient and chart review  Prev Dx/Sx: bipolar 1 disorder, schizophrenia, schizoaffective disorder, altered mental status and history of suicide attempt Current Psych Provider: unknown Home Meds (current): zyprexa , trileptal   Previous Med Trials: lithium  and depakote  Therapy: unknown  Prior Psych Hospitalization: yes, multiple most recent [facility] d/c on 06/17/2024 Prior Self Harm: yes Prior Violence: denies  Family Psych History: brother - mental illness Family Hx suicide: none noted  Social History:  Developmental Hx: WNL Educational Hx: Associates degree in Business Management Occupational Hx: unknown Legal Hx: none noted Living Situation: recently at [place] facility Spiritual Hx: yes Access to weapons/lethal means: denies   Substance History Alcohol : denies  Tobacco: denies Illicit drugs: denies Prescription drug abuse: denies Rehab hx: denies  Exam Findings  Physical Exam:  Vital Signs:  Temp:  [98 F (36.7 C)-98.2 F (36.8 C)] 98 F (36.7 C) (07/23 0630) Pulse Rate:  [81-104] 89 (07/23 0630) Resp:  [  15-18] 18 (07/23 0630) BP: (97-133)/(60-91) 133/91 (07/23 0630) SpO2:  [99 %-100 %] 99 % (07/23 0630) Weight:  [57.1 kg] 57.1 kg (07/22 2002) Blood pressure (!) 133/91, pulse 89, temperature 98 F (36.7 C), resp. rate 18, height 5' 1 (1.549 m), weight 57.1 kg, SpO2 99%. Body mass index is 23.79 kg/m.  Physical Exam Vitals and nursing note reviewed.  Eyes:     Pupils: Pupils are equal, round, and reactive to light.  Pulmonary:     Effort: Pulmonary effort is normal.  Skin:    General: Skin is dry.  Neurological:     Mental Status: She is alert.     Comments: Oriented X 2 to self and place  Psychiatric:        Attention and Perception: She is inattentive.        Mood and Affect: Affect is labile and tearful.        Speech: Speech is rapid and pressured.        Behavior: Behavior is cooperative.         Thought Content: Thought content is paranoid and delusional.        Judgment: Judgment is impulsive.     Mental Status Exam: General Appearance: Disheveled  Orientation:  Other:  to person and place  Memory:  UTA  Concentration:  Concentration: Poor  Recall:  UTA  Attention  Poor  Eye Contact:  Good  Speech:  Pressured  Language:  Fair  Volume:  Normal  Mood: Anxious   Affect:  Labile; shevacillated between laughter and tears   Thought Process:  Disorganized  Thought Content:  Illogical, Delusions, and Paranoid Ideation  Suicidal Thoughts:  No  Homicidal Thoughts:  No  Judgement:  Impaired  Insight:  Lacking  Psychomotor Activity:  Restlessness  Akathisia:  No  Fund of Knowledge:  Fair      Assets:  Desire for Improvement Housing Social Support  Cognition:  UTA  ADL's:  Intact  AIMS (if indicated):        Other History   These have been pulled in through the EMR, reviewed, and updated if appropriate.  Family History:  The patient's family history includes Alcohol  abuse in her paternal grandfather and paternal uncle; Arthritis in her father; Breast cancer in her maternal aunt and paternal aunt; Diabetes in her brother, mother, and sister; High blood pressure in her father and sister; Hyperlipidemia in her father; Mental illness in her brother and another family member.  Medical History: Past Medical History:  Diagnosis Date  . Bipolar affective disorder (HCC)   . Bipolar disorder (HCC)   . History of arthritis   . History of chicken pox   . History of depression   . History of genital warts   . history of heart murmur   . History of high blood pressure   . History of thyroid  disease   . History of UTI   . Hypertension   . Low TSH level 07/13/2017  . Schizophrenia [facility])     Surgical History: Past Surgical History:  Procedure Laterality Date  . ABLATION ON ENDOMETRIOSIS    . CYST REMOVAL NECK     around 11 years ago /benign  . MULTIPLE TOOTH EXTRACTIONS        Medications:   Current Facility-Administered Medications:  .  amLODipine  (NORVASC ) tablet 10 mg, 10 mg, Oral, Daily, Jerrol Agent, MD .  benztropine  (COGENTIN ) tablet 0.5 mg, 0.5 mg, Oral, BID, Jerrol Agent, MD .  bisacodyl  (DULCOLAX)  suppository 10 mg, 10 mg, Rectal, Daily PRN, Jerrol Agent, MD .  lactulose  (CHRONULAC ) 10 GM/15ML solution 30 g, 30 g, Oral, Daily, Jerrol Agent, MD .  OLANZapine  zydis (ZYPREXA ) disintegrating tablet 10 mg, 10 mg, Oral, Q8H PRN **AND** LORazepam  (ATIVAN ) tablet 1 mg, 1 mg, Oral, PRN **AND** [COMPLETED] ziprasidone  (GEODON ) injection 20 mg, 20 mg, Intramuscular, PRN, Melvenia Motto, MD, 20 mg at 06/18/24 0114 .  magnesium  hydroxide (MILK OF MAGNESIA) suspension 30 mL, 30 mL, Oral, Daily PRN, Jerrol Agent, MD .  methimazole  (TAPAZOLE ) tablet 10 mg, 10 mg, Oral, q AM, Jerrol Agent, MD, 10 mg at 06/18/24 9385 .  OXcarbazepine  (TRILEPTAL ) tablet 450 mg, 450 mg, Oral, QHS, Jerrol Agent, MD .  propranolol  (INDERAL ) tablet 10 mg, 10 mg, Oral, BID, Jerrol Agent, MD .  QUEtiapine  (SEROQUEL  XR) 24 hr tablet 50 mg, 50 mg, Oral, QHS, Mardy Elveria DEL, NP .  QUEtiapine  (SEROQUEL ) tablet 25 mg, 25 mg, Oral, Daily, Mardy Elveria DEL, NP  Current Outpatient Medications:  .  acetaminophen  (TYLENOL ) 325 MG tablet, Take 650 mg by mouth every 4 (four) hours as needed (for general discomfort)., Disp: , Rfl:  .  amLODipine  (NORVASC ) 10 MG tablet, Take 1 tablet (10 mg total) by mouth daily., Disp: , Rfl:  .  benztropine  (COGENTIN ) 0.5 MG tablet, Take 0.5 mg by mouth in the morning and at bedtime., Disp: , Rfl:  .  bisacodyl  (DULCOLAX) 10 MG suppository, Place 10 mg rectally daily as needed (for constipation- if no relief from milk of magnesia)., Disp: , Rfl:  .  lactulose  (CHRONULAC ) 10 GM/15ML solution, Take 45 mLs (30 g total) by mouth daily., Disp: , Rfl:  .  methimazole  (TAPAZOLE ) 10 MG tablet, Take 10 mg by mouth in the morning., Disp: , Rfl:  .  MILK OF  MAGNESIA 1200 MG/15ML suspension, Take 30 mLs by mouth daily as needed for mild constipation (if no B/M after 3 days)., Disp: , Rfl:  .  OLANZapine  (ZYPREXA ) 5 MG tablet, Take 1 tablet (5 mg total) by mouth at bedtime. (Patient not taking: Reported on 06/06/2024), Disp: , Rfl:  .  OXcarbazepine  (TRILEPTAL ) 150 MG tablet, Take 150 mg by mouth at bedtime., Disp: , Rfl:  .  Oxcarbazepine  (TRILEPTAL ) 300 MG tablet, Take 1 tablet (300 mg total) by mouth at bedtime., Disp: , Rfl:  .  propranolol  (INDERAL ) 10 MG tablet, Take 1 tablet (10 mg total) by mouth 2 (two) times daily. (Patient taking differently: Take 10 mg by mouth in the morning and at bedtime.), Disp: , Rfl:  .  QUEtiapine  (SEROQUEL ) 25 MG tablet, Take 25-75 mg by mouth See admin instructions. Take 25 mg by mouth in the morning and 75 mg at bedtime, Disp: , Rfl:  .  sodium phosphate  (FLEET) ENEM, Place 1 enema rectally daily as needed (for constipation not relieved by bisacodyl  suppository)., Disp: , Rfl:   Allergies: No Known Allergies  Elveria DEL Mardy, NP

## 2024-06-18 NOTE — Progress Notes (Signed)
 LCSW Progress Note  [id number]   Paula Dickson  06/18/2024  12:48 PM  Description:   Inpatient Psychiatric Referral  Patient was recommended inpatient per Elveria Batter NP. There are no available beds at [facility], per [facility] Cherylynn Ernst RN. Patient was referred to the following out of network facilities:   [facility] Provider Address Phone Fax  [facility]  [address] [place] [address] [phone]  [facility]  [address] [place] [address] [phone]  [facility]  [address] [place], [place] [address] [phone]  [facility]  [address] [place], [place] [address] [phone]  [facility]  [address] [place] [address] [phone]  [facility]  [address] [place] [address] [phone]  [facility]  [address], [place] [address] [phone]  [facility] EFAX  [address] [place], [place] [phone]  [facility]  [address], [place] [address] [phone]      Situation ongoing, CSW to continue following and update chart as more information becomes available.      [place] Estefania Kamiya, MSW, LCSW  06/18/2024 12:48 PM

## 2024-06-18 NOTE — BH Assessment (Signed)
 Clinician checked in to see if the pt was able to be assessed.    Clinician awaiting response    [name], MS, [facility] Triage Specialist [phone]

## 2024-06-18 NOTE — ED Notes (Signed)
 PT GIVEN CRANBERRY JUICE

## 2024-06-18 NOTE — BH Assessment (Signed)
 Per Asberry Asp, RN pt got another dose of Geodon  and unable to engage.   [name], MS, [facility] Triage Specialist [phone]

## 2024-06-18 NOTE — ED Notes (Signed)
 PT GIVEN SANDWICH BAG

## 2024-06-18 NOTE — ED Notes (Signed)
PT ASSISTED TO THE RESTROOM

## 2024-06-18 NOTE — ED Notes (Signed)
 IVC CASE NUMBER RETREIVED  FROM COURT OF CLERK [phone]

## 2024-06-18 NOTE — ED Notes (Signed)
Ivc current

## 2024-06-18 NOTE — Progress Notes (Signed)
 Pt has been accepted to [facility] on 06/18/2024  Bed assignment: Main campus  Pt meets inpatient criteria per Elveria Batter, NP   Attending Physician will be Millie Manners, MD  Report can be called to: [phone] (this is a pager, please leave call-back number when giving report)  Pt can arrive after 8 AM  Care Team Notified: Judene Moats, RN

## 2024-06-19 DIAGNOSIS — F319 Bipolar disorder, unspecified: Secondary | ICD-10-CM | POA: Diagnosis not present

## 2024-06-19 NOTE — ED Provider Notes (Signed)
 Emergency Medicine Observation Re-evaluation Note  Paula Dickson is a 58 y.o. female, seen on rounds today.  Pt initially presented to the ED for complaints of Manic Behavior Currently, the patient is resting.  Physical Exam  BP (!) 139/97 (BP Location: Left Arm)   Pulse (!) 114   Temp 98.4 F (36.9 C)   Resp 18   Ht 5' 1 (1.549 m)   Wt 57.1 kg   SpO2 100%   BMI 23.79 kg/m  Physical Exam General: nad  ED Course / MDM  EKG:EKG Interpretation Date/Time:  Tuesday June 17 2024 18:23:05 EDT Ventricular Rate:  100 PR Interval:  173 QRS Duration:  79 QT Interval:  343 QTC Calculation: 443 R Axis:   10  Text Interpretation: Sinus tachycardia Low voltage, precordial leads Abnormal R-wave progression, early transition Borderline T abnormalities, anterior leads Confirmed by Jerrol Agent ([id number]) on 06/17/2024 7:34:38 PM  I have reviewed the labs performed to date as well as medications administered while in observation.  Recent changes in the last 24 hours include plan for placement > accepted at [facility]  Plan  Current plan is for placement HH .    Paula Dickson LABOR, DO 06/19/24 [id number]

## 2024-06-19 NOTE — Discharge Instructions (Signed)
 It was a pleasure caring for you today in the emergency department.  Please return to the emergency department for any worsening or worrisome symptoms.

## 2024-06-19 NOTE — ED Notes (Signed)
 Case Number: [id number] IVC documents verified in the [place] IV[place] on 06/17/2024 and the documents are set to expire on 06/24/2024.

## 2024-08-18 ENCOUNTER — Emergency Department (HOSPITAL_COMMUNITY)
Admission: EM | Admit: 2024-08-18 | Discharge: 2024-08-22 | Disposition: A | Discharge status: Other Acute Inpatient Hospital | Attending: Emergency Medicine | Admitting: Emergency Medicine

## 2024-08-18 DIAGNOSIS — I10 Essential (primary) hypertension: Secondary | ICD-10-CM | POA: Insufficient documentation

## 2024-08-18 DIAGNOSIS — E870 Hyperosmolality and hypernatremia: Secondary | ICD-10-CM | POA: Diagnosis not present

## 2024-08-18 DIAGNOSIS — Z79899 Other long term (current) drug therapy: Secondary | ICD-10-CM | POA: Diagnosis not present

## 2024-08-18 DIAGNOSIS — N179 Acute kidney failure, unspecified: Secondary | ICD-10-CM | POA: Insufficient documentation

## 2024-08-18 DIAGNOSIS — F419 Anxiety disorder, unspecified: Secondary | ICD-10-CM | POA: Diagnosis not present

## 2024-08-18 DIAGNOSIS — F22 Delusional disorders: Secondary | ICD-10-CM | POA: Diagnosis not present

## 2024-08-18 DIAGNOSIS — E7220 Disorder of urea cycle metabolism, unspecified: Secondary | ICD-10-CM | POA: Diagnosis not present

## 2024-08-18 DIAGNOSIS — F250 Schizoaffective disorder, bipolar type: Secondary | ICD-10-CM | POA: Insufficient documentation

## 2024-08-18 DIAGNOSIS — R4182 Altered mental status, unspecified: Secondary | ICD-10-CM | POA: Diagnosis present

## 2024-08-18 LAB — COMPREHENSIVE METABOLIC PANEL WITH GFR
ALT: 14 U/L (ref 0–44)
AST: 14 U/L — ABNORMAL LOW (ref 15–41)
Albumin: 4 g/dL (ref 3.5–5.0)
Alkaline Phosphatase: 90 U/L (ref 38–126)
Anion gap: 13 (ref 5–15)
BUN: 29 mg/dL — ABNORMAL HIGH (ref 6–20)
CO2: 20 mmol/L — ABNORMAL LOW (ref 22–32)
Calcium: 10.3 mg/dL (ref 8.9–10.3)
Chloride: 113 mmol/L — ABNORMAL HIGH (ref 98–111)
Creatinine, Ser: 1.38 mg/dL — ABNORMAL HIGH (ref 0.44–1.00)
GFR, Estimated: 44 mL/min — ABNORMAL LOW (ref >60)
Glucose, Bld: 119 mg/dL — ABNORMAL HIGH (ref 70–99)
Potassium: 4 mmol/L (ref 3.5–5.1)
Sodium: 146 mmol/L — ABNORMAL HIGH (ref 135–145)
Total Bilirubin: 1 mg/dL (ref 0.0–1.2)
Total Protein: 7.3 g/dL (ref 6.5–8.1)

## 2024-08-18 LAB — CBC WITH DIFFERENTIAL/PLATELET
Abs Immature Granulocytes: 0.03 K/uL (ref 0.00–0.07)
Basophils Absolute: 0.1 K/uL (ref 0.0–0.1)
Basophils Relative: 1 %
Eosinophils Absolute: 0 K/uL (ref 0.0–0.5)
Eosinophils Relative: 0 %
HCT: 44.1 % (ref 36.0–46.0)
Hemoglobin: 14.3 g/dL (ref 12.0–15.0)
Immature Granulocytes: 0 %
Lymphocytes Relative: 26 %
Lymphs Abs: 2.3 K/uL (ref 0.7–4.0)
MCH: 27.9 pg (ref 26.0–34.0)
MCHC: 32.4 g/dL (ref 30.0–36.0)
MCV: 86 fL (ref 80.0–100.0)
Monocytes Absolute: 0.9 K/uL (ref 0.1–1.0)
Monocytes Relative: 10 %
Neutro Abs: 5.5 K/uL (ref 1.7–7.7)
Neutrophils Relative %: 63 %
Platelets: 234 K/uL (ref 150–400)
RBC: 5.13 MIL/uL — ABNORMAL HIGH (ref 3.87–5.11)
RDW: 14.8 % (ref 11.5–15.5)
WBC: 8.9 K/uL (ref 4.0–10.5)
nRBC: 0 % (ref 0.0–0.2)

## 2024-08-18 LAB — URINALYSIS, ROUTINE W REFLEX MICROSCOPIC
Bilirubin Urine: NEGATIVE
Glucose, UA: NEGATIVE mg/dL
Hgb urine dipstick: NEGATIVE
Ketones, ur: 5 mg/dL — AB
Leukocytes,Ua: NEGATIVE
Nitrite: NEGATIVE
Protein, ur: NEGATIVE mg/dL
Specific Gravity, Urine: 1.013 (ref 1.005–1.030)
pH: 5 (ref 5.0–8.0)

## 2024-08-18 LAB — ACETAMINOPHEN LEVEL: Acetaminophen (Tylenol), Serum: <10 ug/mL — ABNORMAL LOW (ref 10–30)

## 2024-08-18 LAB — ETHANOL: Alcohol, Ethyl (B): <15 mg/dL (ref <15)

## 2024-08-18 LAB — SALICYLATE LEVEL: Salicylate Lvl: <7 mg/dL — ABNORMAL LOW (ref 7.0–30.0)

## 2024-08-18 MED ORDER — HALOPERIDOL LACTATE 5 MG/ML IJ SOLN
5.0000 mg | Freq: Once | INTRAMUSCULAR | Status: AC
  Administered 2024-08-18: 5 mg via INTRAMUSCULAR
  Filled 2024-08-18: qty 1

## 2024-08-18 MED ORDER — LACTATED RINGERS IV BOLUS
500.0000 mL | Freq: Once | INTRAVENOUS | Status: AC
  Administered 2024-08-19: 500 mL via INTRAVENOUS

## 2024-08-18 MED ORDER — LORAZEPAM 2 MG/ML IJ SOLN
1.0000 mg | Freq: Once | INTRAMUSCULAR | Status: AC
Start: 2024-08-18 — End: 2024-08-18
  Administered 2024-08-18: 1 mg via INTRAMUSCULAR
  Filled 2024-08-18: qty 1

## 2024-08-18 NOTE — ED Triage Notes (Addendum)
 PT BIB EMS from home where she lives alone. EMS reports pt sister found her disoriented at home and has not been compliant with meds and pills were everywhere on the ground of her home. EMS reports strong urine smell. Hx off afib.  Per EMS pt is here voluntarily, and did not meet pre hospital criteria for IVC.  During triage process, pt has flight of ideas and rambles of of topic things when asked questions and requires frequent redirection.   EMS VS HR 140, 206/106, unable to obtain oxygen sat, cbg 138

## 2024-08-18 NOTE — ED Notes (Signed)
 Pt unable to void at this time.  KM

## 2024-08-18 NOTE — ED Provider Notes (Signed)
 [place] EMERGENCY DEPARTMENT AT [facility] Provider Note   CSN: [id number] Arrival date & time: 08/18/24  2110     Patient presents with: Altered Mental Status   Paula Dickson is a 58 y.o. female.  {Add pertinent medical, surgical, social history, OB history to YEP:67052}  Altered Mental Status    Patient has a history of high blood pressure bipolar disorder schizophrenia hypertension.  Patient has not been compliant with her medications for the last several days.  Her sister found her agitated and her house was disheveled.  Patient was rambling in her speech.  Patient has been admitted to the hospital previously for similar episodes.  Patient cannot tell me exactly why she is here in the hospital.  She starts speaking about her family members, asking about taking a shower, she asked for water  but does not directly answer my questions.  Prior to Admission medications   Medication Sig Start Date End Date Taking? Authorizing Provider  acetaminophen  (TYLENOL ) 325 MG tablet Take 650 mg by mouth every 4 (four) hours as needed (for general discomfort).    [provider]  amLODipine  (NORVASC ) 10 MG tablet Take 1 tablet (10 mg total) by mouth daily. 12/13/23   Singh, Prashant K, MD  benztropine  (COGENTIN ) 0.5 MG tablet Take 0.5 mg by mouth in the morning and at bedtime. 02/20/24   [provider]  bisacodyl  (DULCOLAX) 10 MG suppository Place 10 mg rectally daily as needed (for constipation- if no relief from milk of magnesia).    [provider]  lactulose  (CHRONULAC ) 10 GM/15ML solution Take 45 mLs (30 g total) by mouth daily. 12/13/23   Singh, Prashant K, MD  methimazole  (TAPAZOLE ) 10 MG tablet Take 10 mg by mouth in the morning.    [provider]  MILK OF MAGNESIA 1200 MG/15ML suspension Take 30 mLs by mouth daily as needed for mild constipation (if no B/M after 3 days).    [provider]  OLANZapine  (ZYPREXA ) 5 MG tablet  Take 1 tablet (5 mg total) by mouth at bedtime. Patient not taking: Reported on 06/06/2024 12/13/23   Singh, Prashant K, MD  OXcarbazepine  (TRILEPTAL ) 150 MG tablet Take 150 mg by mouth at bedtime.    [provider]  Oxcarbazepine  (TRILEPTAL ) 300 MG tablet Take 1 tablet (300 mg total) by mouth at bedtime. 12/13/23   Singh, Prashant K, MD  propranolol  (INDERAL ) 10 MG tablet Take 1 tablet (10 mg total) by mouth 2 (two) times daily. Patient taking differently: Take 10 mg by mouth in the morning and at bedtime. 08/16/22   Christobal Guadalajara, MD  QUEtiapine  (SEROQUEL ) 25 MG tablet Take 25-75 mg by mouth See admin instructions. Take 25 mg by mouth in the morning and 75 mg at bedtime    [provider]  sodium phosphate  (FLEET) ENEM Place 1 enema rectally daily as needed (for constipation not relieved by bisacodyl  suppository).    [provider]    Allergies: Patient has no known allergies.    Review of Systems  Updated Vital Signs BP (!) 154/117 (BP Location: Right Arm)   Pulse (!) 102   Temp 98.8 F (37.1 C) (Oral)   Resp 20   SpO2 100%   Physical Exam Vitals and nursing note reviewed.  Constitutional:      Appearance: She is well-developed. She is not diaphoretic.  HENT:     Head: Normocephalic and atraumatic.     Right Ear: External ear normal.  Left Ear: External ear normal.  Eyes:     General: No scleral icterus.       Right eye: No discharge.        Left eye: No discharge.     Conjunctiva/sclera: Conjunctivae normal.  Neck:     Trachea: No tracheal deviation.  Cardiovascular:     Rate and Rhythm: Normal rate and regular rhythm.  Pulmonary:     Effort: Pulmonary effort is normal. No respiratory distress.     Breath sounds: Normal breath sounds. No stridor. No wheezing or rales.  Abdominal:     General: Bowel sounds are normal. There is no distension.     Palpations: Abdomen is soft.     Tenderness: There is no abdominal tenderness. There is no guarding  or rebound.  Musculoskeletal:        General: No tenderness or deformity.     Cervical back: Neck supple.  Skin:    General: Skin is warm and dry.     Findings: No rash.  Neurological:     General: No focal deficit present.     Mental Status: She is alert.     Cranial Nerves: No cranial nerve deficit, dysarthria or facial asymmetry.     Sensory: No sensory deficit.     Motor: No abnormal muscle tone or seizure activity.     Coordination: Coordination normal.  Psychiatric:        Mood and Affect: Mood is anxious. Affect is labile.        Speech: Speech is rapid and pressured and tangential.        Behavior: Behavior is hyperactive. Behavior is not slowed, withdrawn or combative.     (all labs ordered are listed, but only abnormal results are displayed) Labs Reviewed  COMPREHENSIVE METABOLIC PANEL WITH GFR  ETHANOL  RAPID URINE DRUG SCREEN, HOSP PERFORMED  CBC WITH DIFFERENTIAL/PLATELET  URINALYSIS, ROUTINE W REFLEX MICROSCOPIC    EKG: None  Radiology: No results found.  {Document cardiac monitor, telemetry assessment procedure when appropriate:32947} Procedures   Medications Ordered in the ED  LORazepam  (ATIVAN ) injection 1 mg (has no administration in time range)  haloperidol  lactate (HALDOL ) injection 5 mg (has no administration in time range)      {Click here for ABCD2, HEART and other calculators REFRESH Note before signing:1}                              Medical Decision Making Amount and/or Complexity of Data Reviewed Labs: ordered.  Risk Prescription drug management.   ***  {Document critical care time when appropriate  Document review of labs and clinical decision tools ie CHADS2VASC2, etc  Document your independent review of radiology images and any outside records  Document your discussion with family members, caretakers and with consultants  Document social determinants of health affecting pt's care  Document your decision making why or why not  admission, treatments were needed:32947:::1}   Final diagnoses:  None    ED Discharge Orders     None

## 2024-08-19 DIAGNOSIS — N179 Acute kidney failure, unspecified: Secondary | ICD-10-CM | POA: Diagnosis not present

## 2024-08-19 DIAGNOSIS — F250 Schizoaffective disorder, bipolar type: Secondary | ICD-10-CM

## 2024-08-19 LAB — RAPID URINE DRUG SCREEN, HOSP PERFORMED
Amphetamines: NOT DETECTED
Barbiturates: NOT DETECTED
Benzodiazepines: NOT DETECTED
Cocaine: NOT DETECTED
Opiates: NOT DETECTED
Tetrahydrocannabinol: NOT DETECTED

## 2024-08-19 MED ORDER — BENZTROPINE MESYLATE 1 MG PO TABS
0.5000 mg | ORAL_TABLET | Freq: Two times a day (BID) | ORAL | Status: DC
Start: 2024-08-19 — End: 2024-08-22
  Administered 2024-08-19 (×3): 0.5 mg via ORAL
  Filled 2024-08-19 (×4): qty 1

## 2024-08-19 MED ORDER — OXCARBAZEPINE 150 MG PO TABS
450.0000 mg | ORAL_TABLET | Freq: Every day | ORAL | Status: DC
  Administered 2024-08-19 (×2): 450 mg via ORAL
  Filled 2024-08-19 (×6): qty 3

## 2024-08-19 MED ORDER — PROPRANOLOL HCL 10 MG PO TABS
10.0000 mg | ORAL_TABLET | Freq: Two times a day (BID) | ORAL | Status: DC
  Administered 2024-08-19 (×3): 10 mg via ORAL
  Filled 2024-08-19 (×10): qty 1

## 2024-08-19 MED ORDER — BISACODYL 10 MG RE SUPP: 10.0000 mg | Freq: Every day | RECTAL | Status: DC | PRN

## 2024-08-19 MED ORDER — QUETIAPINE FUMARATE 25 MG PO TABS: 25.0000 mg | ORAL_TABLET | ORAL | Status: DC

## 2024-08-19 MED ORDER — OLANZAPINE 5 MG PO TABS
5.0000 mg | ORAL_TABLET | Freq: Two times a day (BID) | ORAL | Status: DC
  Administered 2024-08-19 (×2): 5 mg via ORAL
  Filled 2024-08-19 (×3): qty 1

## 2024-08-19 MED ORDER — AMLODIPINE BESYLATE 5 MG PO TABS
10.0000 mg | ORAL_TABLET | Freq: Every day | ORAL | Status: DC
  Administered 2024-08-19: 10 mg via ORAL
  Filled 2024-08-19: qty 2

## 2024-08-19 NOTE — Progress Notes (Signed)
 Pt has been accepted to [facility] on 08/19/2024 . Unit assignment: [place] [facility]   Pt meets inpatient criteria per Elveria Batter, NP  Attending Physician will be Dr. Jess   Report can be called to: Adult unit: [phone]  Pt can arrive after 8AM   Care Team Notified: Ollie Constable, RN, Elveria Batter, NP.

## 2024-08-19 NOTE — Progress Notes (Signed)
 Inpatient Psychiatric Referral  Patient was recommended inpatient per Elveria Batter, NP. There are no available beds at [facility], per [facility] . Patient was referred to the following out of network facilities:  [facility] Provider Address Phone Fax  [facility]  [address] [place] [address] [phone]  [facility]  [address] [place], [place] [address] [phone]  [facility]  [address] [place], [place] [address] [phone]  [facility]  [address] [place] [address] [phone]  [facility]  [address] [place] [address] [phone]  [facility]  [address], [place] [address] [phone]  [facility] EFAX  [address] [place], [place] [phone]  [facility]  [address] [place] [address] [phone]  [facility]  [address] [place], [place] [address] [phone]    Situation ongoing, CSW to continue following and update chart as more information becomes available.   Harrie Sofia MSW, LCSWA 08/19/2024  5:36PM
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
## 2024-08-19 NOTE — ED Notes (Signed)
 Pt showered and washed face and hair. Changed into fresh scrubs, underwear and socks. Snack provided and pt back in bed at this time

## 2024-08-19 NOTE — ED Provider Notes (Signed)
 Emergency Medicine Observation Re-evaluation Note  Paula Dickson is a 58 y.o. female, seen on rounds today.  Pt initially presented to the ED for complaints of Altered Mental Status Currently, the patient is not have any acute complaints.  Physical Exam  BP 127/72   Pulse 70   Temp 98 F (36.7 C) (Oral)   Resp 14   SpO2 98%  Physical Exam General: Resting comfortably in stretcher Lungs: Normal work of breathing Psych: Calm  ED Course / MDM  EKG:EKG Interpretation Date/Time:  Monday August 18 2024 23:03:09 EDT Ventricular Rate:  113 PR Interval:  153 QRS Duration:  98 QT Interval:  331 QTC Calculation: 454 R Axis:   -1  Text Interpretation: Sinus tachycardia Abnormal R-wave progression, early transition Probable left ventricular hypertrophy Nonspecific T abnormalities, diffuse leads Confirmed by Randol Simmonds [phone]) on 08/18/2024 11:20:14 PM  I have reviewed the labs performed to date as well as medications administered while in observation.  Recent changes in the last 24 hours include seen by psychiatry who recommends inpatient treatment.  Plan  Current plan is for placement.    Yolande Lamar BROCKS, MD 08/19/24 [phone]

## 2024-08-20 DIAGNOSIS — N179 Acute kidney failure, unspecified: Secondary | ICD-10-CM | POA: Diagnosis not present

## 2024-08-20 MED ORDER — ZIPRASIDONE MESYLATE 20 MG IM SOLR
10.0000 mg | Freq: Once | INTRAMUSCULAR | Status: AC
  Administered 2024-08-20: 10 mg via INTRAMUSCULAR
  Filled 2024-08-20: qty 20

## 2024-08-20 MED ORDER — STERILE WATER FOR INJECTION IJ SOLN
INTRAMUSCULAR | Status: AC
  Administered 2024-08-20: 10 mL
  Filled 2024-08-20: qty 10

## 2024-08-20 NOTE — ED Provider Notes (Signed)
 Patient refusing to take her meds.  Patient stripped all her clothes off.  She is yelling at staff.  Not being cooperative.  Patient plan is for inpatient admission.  Patient is IVC.  Patient may have placement in the morning.  Based on this we will go ahead and give her 10 mg of Geodon  IM.   Anelis Hrivnak, MD 08/20/24 [id number]

## 2024-08-20 NOTE — ED Provider Notes (Signed)
 Emergency Medicine Observation Re-evaluation Note  Paula Dickson is a 58 y.o. female, seen on rounds today.  Pt initially presented to the ED for complaints of Altered Mental Status Currently, the patient is agitated and yelling in her exam room.  Physical Exam  BP (!) 141/85 (BP Location: Left Arm)   Pulse 67   Temp 97.9 F (36.6 C) (Oral)   Resp 16   SpO2 100%  Physical Exam General: Sitting on the bed yelling Cardiac: Not tachycardic on last vitals Lungs: No respiratory distress with symmetric breathing but she is yelling with a loud voice Psych: Agitated.  Appears paranoid.    ED Course / MDM  EKG:EKG Interpretation Date/Time:  Monday August 18 2024 23:03:09 EDT Ventricular Rate:  113 PR Interval:  153 QRS Duration:  98 QT Interval:  331 QTC Calculation: 454 R Axis:   -1  Text Interpretation: Sinus tachycardia Abnormal R-wave progression, early transition Probable left ventricular hypertrophy Nonspecific T abnormalities, diffuse leads Confirmed by Randol Simmonds [phone]) on 08/18/2024 11:20:14 PM  I have reviewed the labs performed to date as well as medications administered while in observation.  Recent changes in the last 24 hours include patient need to be placed under IVC this morning as she wanted to leave the hospital and psychiatry recommended inpatient for her disorganized paranoia and mania.  Plan  Current plan is for inpatient management.  Had to be placed under IVC this morning as she wanted to leave the hospital and psychiatry requested IVC.  When I assessed her she is yelling and agitated and does not want to be admitted.  She appears paranoid and had me been down to look at her so she could look me in the eyes and then was muttering to herself.  IVC paperwork filled out so that she can be safely admitted for management    Tonette Koehne, Lonni PARAS, MD 08/20/24 [phone]

## 2024-08-20 NOTE — ED Notes (Signed)
 Pt walking around department, talking incoherently. Behavior is boisterous, but redirectable.

## 2024-08-20 NOTE — ED Notes (Signed)
 Pt refusing medication. Will attempt to administer PO meds as ordered.

## 2024-08-20 NOTE — ED Notes (Signed)
 Pt yelling out, I prolly done got a UTI! Been in here laying in piss all day! Pt has been ambulatory, up to BR and around unit all day. Offered patient supplies to take a shower, pt declined, stating, What? So you can say I be flashing everybody? Come this way so I can see your face. I need to see if my kidney function is good. Who wants to work for y'all? Pt speech is tangential, delusional, and difficult to redirect. Pt remaining in room at this time.

## 2024-08-20 NOTE — ED Notes (Signed)
 When asked where she is pt states where you work. Pt agreed to oral medications with orange juice, pt provided juice and pt removed lid and set up facing RN and holding cup as to prepare to throw juice as pt previously threw food tray in floor. Pt refuses linen change, vs, meds despite education. Pt continues to yell after staff has left room. Green zone MD notified.

## 2024-08-20 NOTE — ED Notes (Signed)
 Pt refusing VS at this time.

## 2024-08-20 NOTE — ED Notes (Signed)
 RN called staffing for sitter, Doak not available per staffing

## 2024-08-20 NOTE — ED Notes (Signed)
 Called sheriff dept to request transport for patient to [facility]. No answer, left VM.

## 2024-08-20 NOTE — ED Notes (Signed)
 IVC PAPERWORK COMPLETED COPIES DONE ORIGINAL COPY IN THE RED FOLDER COPY IN THE MEDICAL RECORDS AND THE FINDINGS AND CUSTODY UPLOADED TO THE CHART AND  FILED INTO THE EFILE FOR COURT OF CLERK. COPIES ATTACHED TO THE CLIPBOARD IN PURPLE ZONE.

## 2024-08-20 NOTE — ED Notes (Signed)
 Pt refusing to sign voluntary form for transfer to [facility]. MD, psych NP, and SW notified. IVC to be completed. SW contacting [facility] to make aware of delay in patient arrival.

## 2024-08-20 NOTE — ED Notes (Signed)
 Pt continues to leave room and request to sit behind nurse's station. Explained to patient multiple times that she cannot sit behind nurse's station and that she needs to stay in her room d/t patient privacy concerns. Pt redirected back to room.

## 2024-08-20 NOTE — ED Notes (Signed)
 Envelope Number: [id number] Dr. Ginger completed the first exam along with the affidavit and petition.  IVC documents uploaded to the patient's chart and e-filed to the clerk of courts.  The envelope number was recorded in the e-ivc binder in the orange zone.  No answer from the magistrate after 3 attempts to report envelope number.

## 2024-08-20 NOTE — ED Notes (Signed)
 This RN attempted to take pts vital signs and administer evening medications, pt ripped off blood pressure cuff and starts yelling get away from her. Pt continues talking to herself despite multiple attempts to re-orient patient.  Pt yelling I pissed myself and pt removes all clothing and underwear, pt provided with clean scrubs and offered assistance at this time, pt continues to should just get out.

## 2024-08-20 NOTE — ED Notes (Signed)
 Affidavit and Petition along with the first exam are located on the clipboard in the blue zone.

## 2024-08-20 NOTE — ED Notes (Signed)
 SPOKE WITH MAGISTRATE AND THEY WILL REVIEW THE PAPERWORK  AND DISPATCH AN OFFICER. WITH THE FINDINGS AND CUSTODY

## 2024-08-20 NOTE — ED Notes (Signed)
Pt resting quietly in bed with eyes closed. Respirations even and unlabored. No S/S of distress noted.  

## 2024-08-20 NOTE — ED Notes (Signed)
 Security called to bedside to administer Geodon  for pt and staff safety.
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
## 2024-08-21 DIAGNOSIS — N179 Acute kidney failure, unspecified: Secondary | ICD-10-CM | POA: Diagnosis not present

## 2024-08-21 MED ORDER — ZIPRASIDONE MESYLATE 20 MG IM SOLR
10.0000 mg | Freq: Once | INTRAMUSCULAR | Status: AC
Start: 2024-08-21 — End: 2024-08-21
  Administered 2024-08-21: 10 mg via INTRAMUSCULAR
  Filled 2024-08-21: qty 20

## 2024-08-21 MED ORDER — STERILE WATER FOR INJECTION IJ SOLN
INTRAMUSCULAR | Status: AC
  Administered 2024-08-21: 10 mL
  Filled 2024-08-21: qty 10

## 2024-08-21 NOTE — ED Notes (Signed)
 IVC IS CURRENT

## 2024-08-21 NOTE — ED Notes (Signed)
 iVC IS CURRENT

## 2024-08-21 NOTE — ED Provider Notes (Signed)
 Emergency Medicine Observation Re-evaluation Note  Paula Dickson is a 58 y.o. female, seen on rounds today.  Pt initially presented to the ED for complaints of Altered Mental Status Currently, the patient is resting in bed, no complaints but states I dont talk to anybody and pulls blanket over head .  Physical Exam  BP (!) 157/98   Pulse 91   Temp 97.9 F (36.6 C) (Oral)   Resp 18   SpO2 100%  Physical Exam General: NAD Cardiac: regular rate Lungs: equal chest rise  Psych: calm  ED Course / MDM  EKG:EKG Interpretation Date/Time:  Monday August 18 2024 23:03:09 EDT Ventricular Rate:  113 PR Interval:  153 QRS Duration:  98 QT Interval:  331 QTC Calculation: 454 R Axis:   -1  Text Interpretation: Sinus tachycardia Abnormal R-wave progression, early transition Probable left ventricular hypertrophy Nonspecific T abnormalities, diffuse leads Confirmed by Randol Simmonds [phone]) on 08/18/2024 11:20:14 PM  I have reviewed the labs performed to date as well as medications administered while in observation.  Recent changes in the last 24 hours include mildly agitated yesterday.  Plan  [facility] .    Francesca Elsie CROME, MD 08/21/24 [phone]

## 2024-08-21 NOTE — ED Notes (Addendum)
 Pt refuses vs. Pt soiled bed, removed scrubs and threw them in the floor again. Pt changed to clean scrubs and bed linen changed. Warm blankets provided.

## 2024-08-21 NOTE — ED Notes (Signed)
 Charge RN to bedside, scrubs and linen changed. Pt continues yelling out statements talking to herself, despite multiple unsuccessful attempts to redirect or calm pt. Pt provided snack and juice as requested, pt states no you probably spit in it. Orange MD notified and verbal order for geodon  administered. Pt continues yelling from her room at this time.

## 2024-08-21 NOTE — ED Notes (Signed)
 [facility] [employer] attempted to obtain pt VS and assist pt with hygiene, pt refuses assistance from both Rns and sitter.

## 2024-08-21 NOTE — ED Notes (Signed)
 Pt sitting up in bed eating breakfast, continues to talk loudly in flight of ideas.  This RN and a second RN enter room with dynamap, pt begins to say no and shake her head.  Pt informed that it was no longer an option and that we would be obtaining VS.  Pt pulls arm back from RN and under covers.  This RN holds pt arm and second RN applies BP cuff.  Pt continues to repeat, it's gonna be high, what you gonna do then, when it's high.  VS obtained.  Attempted to call report to [facility], no answer at this time.

## 2024-08-21 NOTE — Progress Notes (Signed)
 Patient expressed the need to use the bathroom but refused to use the facility, stating, "I'm not using that nasty bathroom." Patient subsequently urinated on herself. Writer offered the option to shower, which the patient refused, stating, "Come clean me up."  Writer provided clean linen and scrubs; patient initially refused assistance and displayed non-compliance with proper hygiene. Snacks and a [place] sandwich were supplied per patient request. Nursing technician (NT) on floor assisted patient with hygiene and full bed clean-up, including linen change and providing new scrubs. Patient also refused medication from nursing staff x4.

## 2024-08-21 NOTE — ED Notes (Signed)
 Attempted to call [facility], no answer at this time

## 2024-08-22 DIAGNOSIS — N179 Acute kidney failure, unspecified: Secondary | ICD-10-CM | POA: Diagnosis not present

## 2024-08-22 MED ORDER — LORAZEPAM 2 MG/ML IJ SOLN
2.0000 mg | Freq: Once | INTRAMUSCULAR | Status: AC
  Administered 2024-08-22: 2 mg via INTRAMUSCULAR
  Filled 2024-08-22: qty 1

## 2024-08-22 MED ORDER — ZIPRASIDONE MESYLATE 20 MG IM SOLR
20.0000 mg | Freq: Once | INTRAMUSCULAR | Status: AC
  Administered 2024-08-22: 20 mg via INTRAMUSCULAR
  Filled 2024-08-22: qty 20

## 2024-08-22 MED ORDER — STERILE WATER FOR INJECTION IJ SOLN
INTRAMUSCULAR | Status: AC
  Administered 2024-08-22: 10 mL
  Filled 2024-08-22: qty 10

## 2024-08-22 NOTE — ED Notes (Signed)
 Attempted to call Paula Dickson multiple times, no answer.  Will continue to call.  Transport on standby.

## 2024-08-22 NOTE — ED Notes (Signed)
 Report to [employer], Charity fundraiser at [facility]

## 2024-08-22 NOTE — ED Notes (Addendum)
 Pt refusing all care including medications and VS.  This RN, tech and security to room to obtain VS.  Pt kicking at staff, yelling and speaking insults to staff.  Attempted to call [facility] for report, no answer at this time.

## 2024-08-22 NOTE — ED Notes (Signed)
 After multiple attempts spoke with staff at [facility], they are unable to accept the pt d/t medication non-compliance and elevate BP.  Pt continues to be loud and insult staff.  Sitter cleaning pt of wet/dirty brief.  Pt yelling at sitter and fighting against sitter efforts to assist her.  EDP aware of same.

## 2024-08-22 NOTE — ED Notes (Signed)
 Pt checked, brief dry, pt placed in paper pants in preparation for transport.

## 2024-08-22 NOTE — ED Notes (Signed)
Attempted to call [name] for report, no answer at this time.

## 2024-08-22 NOTE — Progress Notes (Signed)
 LCSW Progress Note  [id number]   Paula Dickson  08/22/2024  9:47 AM  Description:   Inpatient Psychiatric Referral  Patient was recommended inpatient per [facility]). There are no available beds at [facility], per [facility] RN). Patient was referred to the following out of network facilities:    [facility] Provider Address Phone Fax  [facility]  [address], [place] [address] [phone]  [facility]  [address] [place], [place] [address] [phone]  [facility]  [address] [place], [place] [address] [phone]  [facility]  [address], [place] [address] [phone]  [facility]  [address] [place] [address] [phone]  [facility]  [address], [place] [address] [phone]  [facility] EFAX  [address] [place], [place] [phone]  [facility]  [address] [place] [address] [phone]  [facility]  [address] [place], [place] [address] [phone]      Situation ongoing, CSW to continue following and update chart as more information becomes available.    [place] Jhovany Weidinger MSW, LCSW  08/22/2024 9:47 AM

## 2024-08-22 NOTE — Progress Notes (Signed)
 Pt has been accepted to [facility] TODAY 09/26/20025. Bed assignment: Main campus  Pt meets inpatient criteria per Elveria Batter NP  Attending Physician will be Millie Manners, MD  Report can be called to: [phone] (this is a pager, please leave call-back number when giving report)  Pt can arrive after 8 AM  Care Team Notified: Jerel Gravely NP, Delon Side RN   [place] Graysin Luczynski LCSW-A   08/22/2024 10:55 AM

## 2024-08-22 NOTE — ED Provider Notes (Signed)
 Emergency Medicine Observation Re-evaluation Note  Paula Dickson is a 58 y.o. female, seen on rounds today.  Pt initially presented to the ED for complaints of Altered Mental Status Currently, the patient is resting.  Physical Exam  BP (!) 182/98   Pulse 98   Temp 98 F (36.7 C) (Oral)   Resp 18   SpO2 99%  Physical Exam General: NAD   ED Course / MDM  EKG:EKG Interpretation Date/Time:  Monday August 18 2024 23:03:09 EDT Ventricular Rate:  113 PR Interval:  153 QRS Duration:  98 QT Interval:  331 QTC Calculation: 454 R Axis:   -1  Text Interpretation: Sinus tachycardia Abnormal R-wave progression, early transition Probable left ventricular hypertrophy Nonspecific T abnormalities, diffuse leads Confirmed by Randol Simmonds [phone]) on 08/18/2024 11:20:14 PM  I have reviewed the labs performed to date as well as medications administered while in observation.  Recent changes in the last 24 hours include agitation requiring medication.  Plan  Current plan is for placement - on IVC, accepted to [facility].    Laurice Maude BROCKS, MD 08/22/24 [phone]

## 2024-08-22 NOTE — ED Notes (Signed)
 IVC IS CURRENT

## 2024-10-09 ENCOUNTER — Ambulatory Visit (INDEPENDENT_AMBULATORY_CARE_PROVIDER_SITE_OTHER): Admitting: Mental Health

## 2024-10-09 DIAGNOSIS — F250 Schizoaffective disorder, bipolar type: Secondary | ICD-10-CM
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
## 2024-10-14 ENCOUNTER — Ambulatory Visit (INDEPENDENT_AMBULATORY_CARE_PROVIDER_SITE_OTHER): Admitting: Psychiatry

## 2024-10-14 ENCOUNTER — Encounter (HOSPITAL_COMMUNITY): Payer: Self-pay

## 2024-10-14 ENCOUNTER — Ambulatory Visit (INDEPENDENT_AMBULATORY_CARE_PROVIDER_SITE_OTHER)

## 2024-10-14 ENCOUNTER — Encounter (HOSPITAL_COMMUNITY): Payer: Self-pay | Admitting: Psychiatry

## 2024-10-14 VITALS — BP 125/83 | HR 87 | Temp 98.0°F | Wt 159.8 lb

## 2024-10-14 VITALS — BP 125/83 | HR 87 | Temp 98.0°F | Ht 61.0 in | Wt 159.8 lb

## 2024-10-14 DIAGNOSIS — F250 Schizoaffective disorder, bipolar type: Secondary | ICD-10-CM | POA: Diagnosis not present

## 2024-10-14 MED ORDER — INVEGA SUSTENNA 156 MG/ML IM SUSY: 156.0000 mg | PREFILLED_SYRINGE | INTRAMUSCULAR | 11 refills | Status: DC

## 2024-10-14 MED ORDER — PALIPERIDONE PALMITATE ER 156 MG/ML IM SUSY
156.0000 mg | PREFILLED_SYRINGE | INTRAMUSCULAR | Status: AC
  Administered 2024-10-14: 156 mg via INTRAMUSCULAR

## 2024-10-14 MED ORDER — DIVALPROEX SODIUM 500 MG PO DR TAB: DELAYED_RELEASE_TABLET | ORAL | 3 refills | Status: DC

## 2024-10-14 MED ORDER — OLANZAPINE 10 MG PO TABS: 10.0000 mg | ORAL_TABLET | Freq: Two times a day (BID) | ORAL | 3 refills | Status: DC
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
## 2024-10-14 NOTE — Progress Notes (Cosign Needed)
 Patient presented to office for injection of Invega  Sustenna 156 mg. Patient received injection in  right deltoid. Patient was seen by Dr. Harl prior to injection. Dr.Parsons gave approval to administer injection. Patient tolerated injection well. Patient denies any concerns. Patient will return in 30 days. Patient left alert and ambulatory.

## 2024-11-05 ENCOUNTER — Other Ambulatory Visit: Payer: Self-pay | Admitting: Adult Health Nurse Practitioner

## 2024-11-05 ENCOUNTER — Other Ambulatory Visit: Payer: Self-pay | Admitting: Internal Medicine

## 2024-11-05 DIAGNOSIS — Z1231 Encounter for screening mammogram for malignant neoplasm of breast: Secondary | ICD-10-CM

## 2024-11-13 ENCOUNTER — Other Ambulatory Visit (HOSPITAL_COMMUNITY): Payer: Self-pay | Admitting: Psychiatry

## 2024-11-13 ENCOUNTER — Telehealth (HOSPITAL_COMMUNITY): Payer: Self-pay

## 2024-11-13 ENCOUNTER — Encounter (HOSPITAL_COMMUNITY): Payer: Self-pay

## 2024-11-13 ENCOUNTER — Ambulatory Visit (INDEPENDENT_AMBULATORY_CARE_PROVIDER_SITE_OTHER)

## 2024-11-13 VITALS — BP 121/81 | HR 102 | Temp 97.9°F | Resp 102 | Ht 61.0 in | Wt 172.2 lb

## 2024-11-13 DIAGNOSIS — F312 Bipolar disorder, current episode manic severe with psychotic features: Secondary | ICD-10-CM | POA: Diagnosis not present

## 2024-11-13 DIAGNOSIS — F259 Schizoaffective disorder, unspecified: Secondary | ICD-10-CM

## 2024-11-13 MED ORDER — VALBENAZINE TOSYLATE 40 MG PO CAPS: 40.0000 mg | ORAL_CAPSULE | Freq: Every day | ORAL | 3 refills | Status: DC

## 2024-11-13 MED ORDER — PALIPERIDONE PALMITATE ER 156 MG/ML IM SUSY
156.0000 mg | PREFILLED_SYRINGE | INTRAMUSCULAR | Status: AC
  Administered 2024-11-13 – 2024-12-11 (×2): 156 mg via INTRAMUSCULAR

## 2024-11-13 NOTE — Progress Notes (Cosign Needed)
 Patient presented to office for injection of invega  sustenna 156 mg. Prior to injection, writer noticed trembling in patient's hands. This concern was addressed by Dr. Harl. Patient was given sample of Ingrezza  40 mg to assist with her symptoms. Directions were explained to patient and her sister.    Patient received injection in right deltoid as she was experiencing left arm pain. Patient tolerated injection well. Patient denied SI/HI/AVH. Patient will return in 28 days. Patient left alert and ambulatory.    Permission to come in two days earlier per Dr. Harl.

## 2024-11-13 NOTE — Telephone Encounter (Signed)
 Patient presented to office with symptoms of TD. Provider assessed patient and gave trial of ingrezza  40 mg.

## 2024-11-13 NOTE — Telephone Encounter (Signed)
 Patient is currently on Invega  and Zyprexa .  She has abnormal muscle movements in her jaw, tongue, hands, and face.  Provider conducted an aims assessment patient scored 12.  Provider informed patient that Ingrezza  should be trialed to help manage her symptoms.  She endorsed understanding and agreed.  Patient given Ingrezza  40 mg.  Provider informed patient that in the future Zyprexa  may need to be reduced.  She endorsed understanding and agreed.  No other concerns at this time.

## 2024-12-03 ENCOUNTER — Ambulatory Visit
Admission: RE | Admit: 2024-12-03 | Discharge: 2024-12-03 | Disposition: A | Discharge status: Home / Self Care | Source: Ambulatory Visit | Attending: Internal Medicine | Admitting: Internal Medicine

## 2024-12-03 DIAGNOSIS — Z1231 Encounter for screening mammogram for malignant neoplasm of breast: Secondary | ICD-10-CM

## 2024-12-11 ENCOUNTER — Ambulatory Visit (INDEPENDENT_AMBULATORY_CARE_PROVIDER_SITE_OTHER): Admitting: Psychiatry

## 2024-12-11 ENCOUNTER — Encounter (HOSPITAL_COMMUNITY): Payer: Self-pay

## 2024-12-11 ENCOUNTER — Ambulatory Visit (INDEPENDENT_AMBULATORY_CARE_PROVIDER_SITE_OTHER)

## 2024-12-11 VITALS — BP 137/80 | HR 105 | Ht 61.0 in | Wt 171.6 lb

## 2024-12-11 DIAGNOSIS — F314 Bipolar disorder, current episode depressed, severe, without psychotic features: Secondary | ICD-10-CM | POA: Diagnosis not present

## 2024-12-11 DIAGNOSIS — F250 Schizoaffective disorder, bipolar type: Secondary | ICD-10-CM

## 2024-12-11 DIAGNOSIS — T50905A Adverse effect of unspecified drugs, medicaments and biological substances, initial encounter: Secondary | ICD-10-CM

## 2024-12-11 DIAGNOSIS — G2589 Other specified extrapyramidal and movement disorders: Secondary | ICD-10-CM | POA: Diagnosis not present

## 2024-12-11 DIAGNOSIS — F312 Bipolar disorder, current episode manic severe with psychotic features: Secondary | ICD-10-CM

## 2024-12-11 MED ORDER — OLANZAPINE 10 MG PO TABS: 10.0000 mg | ORAL_TABLET | Freq: Two times a day (BID) | ORAL | 3 refills | Status: AC

## 2024-12-11 MED ORDER — DIVALPROEX SODIUM 500 MG PO DR TAB: DELAYED_RELEASE_TABLET | ORAL | 3 refills | Status: AC

## 2024-12-11 MED ORDER — INVEGA SUSTENNA 156 MG/ML IM SUSY: 156.0000 mg | PREFILLED_SYRINGE | INTRAMUSCULAR | 11 refills | Status: AC

## 2024-12-11 MED ORDER — BENZTROPINE MESYLATE 0.5 MG PO TABS: 0.5000 mg | ORAL_TABLET | Freq: Two times a day (BID) | ORAL | 3 refills | Status: AC

## 2024-12-11 NOTE — Progress Notes (Signed)
 [facility] MD/PA/NP OP Progress Note  12/11/2024 2:14 PM Paula Dickson  MRN:  [id number]  Chief Complaint: I continue to shake  HPI:  76 female seen today for follow up psychiatric evaluation. She has a psychiatric history of Bipolar disorder disorder and schizoaffective disorder . She is currently managed on Invega  Sustenna 156 mg monthly, Depakote  1000 mg nightly Depakote  500 mg daily, and Zyprexa  10 mg twice daily.  Patient informed writer that her medications are effective in managing her psychiatric conditions.    Today she was well-groomed, pleasant, cooperative, engaged in conversation.  Patient informed clinical research associate that mentally she is in a good place.  She notes that her mood is stable and notes that she has minimal anxiety and depression.  Patient notes that she spends her free time at church.  Today provider conducted GAD-7 and patient scored 0, at her last visit she scored a 6.  Provider also conducted PHQ-9 and she scored a 2, at her last visit she scored a 6.  She endorsed adequate sleep and appetite.  Today she denies SI/HI/AVH, mania, paranoia.   She continues to have abnormal muscle movements.  Provider conducted an aims assessment and patient scored a 4.  Patient reports that Ingrezza  was effective however her joint would not cover it.  Provider suggested Cogentin  0.5 mg BID.  Patient notes that in the past she felt that it made her bones feel brittle but inform her that she would like to try it again.  If ineffective provider recommended Austedo.  Patient will continue other medication as prescribed.  No other concerns at this time.     Visit Diagnosis:    ICD-10-CM   1. Schizoaffective disorder, bipolar type (HCC)  F25.0 OLANZapine  (ZYPREXA ) 10 MG tablet    paliperidone  (INVEGA  SUSTENNA) 156 MG/ML SUSY injection    divalproex  (DEPAKOTE ) 500 MG DR tablet      Past Psychiatric History: Bipolar disorder disorder and schizoaffective disorder   Past Medical History:  Past Medical  History:  Diagnosis Date   Bipolar affective disorder (HCC)    Bipolar disorder (HCC)    History of arthritis    History of chicken pox    History of depression    History of genital warts    history of heart murmur    History of high blood pressure    History of thyroid  disease    History of UTI    Hypertension    Low TSH level 07/13/2017   Schizophrenia (HCC)     Past Surgical History:  Procedure Laterality Date   ABLATION ON ENDOMETRIOSIS     CYST REMOVAL NECK     around 11 years ago /benign   MULTIPLE TOOTH EXTRACTIONS      Family Psychiatric History: Brother Bipolar, Alcohol  use paternal uncle, Paternal grand father alcohol  use   Family History:  Family History  Problem Relation Age of Onset   Arthritis Father    Hyperlipidemia Father    High blood pressure Father    Diabetes Sister    Diabetes Mother    Diabetes Brother    Mental illness Brother    Alcohol  abuse Paternal Uncle    Alcohol  abuse Paternal Grandfather    Breast cancer Maternal Aunt    Breast cancer Paternal Aunt    High blood pressure Sister    Mental illness Other        runs in family    Social History:  Social History   Socioeconomic History   Marital  status: Single    Spouse name: Not on file   Number of children: Not on file   Years of education: Not on file   Highest education level: Patient refused  Occupational History   Not on file  Tobacco Use   Smoking status: Never   Smokeless tobacco: Never  Vaping Use   Vaping status: Never Used  Substance and Sexual Activity   Alcohol  use: Not Currently   Drug use: Not Currently   Sexual activity: Not Currently  Other Topics Concern   Not on file  Social History Narrative   ** Merged History Encounter **       Social Drivers of Health   Tobacco Use: Low Risk (12/11/2024)   Patient History    Smoking Tobacco Use: Never    Smokeless Tobacco Use: Never    Passive Exposure: Not on file  Financial Resource Strain: Medium Risk  (10/09/2024)   Overall Financial Resource Strain (CARDIA)    Difficulty of Paying Living Expenses: Somewhat hard  Food Insecurity: Food Insecurity Present (10/09/2024)   Epic    Worried About Programme Researcher, Broadcasting/film/video in the Last Year: Sometimes true    Ran Out of Food in the Last Year: Sometimes true  Transportation Needs: No Transportation Needs (10/09/2024)   Epic    Lack of Transportation (Medical): No    Lack of Transportation (Non-Medical): No  Physical Activity: Inactive (10/09/2024)   Exercise Vital Sign    Days of Exercise per Week: 0 days    Minutes of Exercise per Session: 0 min  Stress: No Stress Concern Present (10/09/2024)   [employer] of Occupational Health - Occupational Stress Questionnaire    Feeling of Stress: Only a little  Social Connections: Moderately Isolated (10/09/2024)   Social Connection and Isolation Panel    Frequency of Communication with Friends and Family: More than three times a week    Frequency of Social Gatherings with Friends and Family: Never    Attends Religious Services: More than 4 times per year    Active Member of Clubs or Organizations: No    Attends Banker Meetings: Never    Marital Status: Never married  Depression (PHQ2-9): Low Risk (12/11/2024)   Depression (PHQ2-9)    PHQ-2 Score: 2  Recent Concern: Depression (PHQ2-9) - Medium Risk (10/14/2024)   Depression (PHQ2-9)    PHQ-2 Score: 6  Alcohol  Screen: Not on file  Housing: Low Risk (10/09/2024)   Epic    Unable to Pay for Housing in the Last Year: No    Number of Times Moved in the Last Year: 0    Homeless in the Last Year: No  Utilities: Not At Risk (10/09/2024)   Epic    Threatened with loss of utilities: No  Health Literacy: Inadequate Health Literacy (10/09/2024)   B1300 Health Literacy    Frequency of need for help with medical instructions: Sometimes    Allergies: Allergies[1]  Metabolic Disorder Labs: Lab Results  Component Value Date   HGBA1C  6.1 (H) 11/24/2021   MPG 128.37 11/24/2021   MPG 119.76 03/28/2021   Lab Results  Component Value Date   PROLACTIN 11.0 03/14/2019   Lab Results  Component Value Date   CHOL 127 11/24/2021   TRIG 161 (H) 11/24/2021   HDL 39 (L) 11/24/2021   CHOLHDL 3.3 11/24/2021   VLDL 32 11/24/2021   LDLCALC 56 11/24/2021   LDLCALC 95 10/26/2020   Lab Results  Component Value Date   TSH  0.048 (L) 06/17/2024   TSH 3.325 12/02/2023    Therapeutic Level Labs: Lab Results  Component Value Date   LITHIUM  0.29 (L) 12/10/2023   LITHIUM  0.60 12/09/2023   Lab Results  Component Value Date   VALPROATE <10 (L) 12/02/2023   VALPROATE 51 12/23/2021   Lab Results  Component Value Date   CBMZ 11.1 03/21/2021   CBMZ 11.7 03/20/2021    Current Medications: Current Outpatient Medications  Medication Sig Dispense Refill   amLODipine  (NORVASC ) 10 MG tablet Take 1 tablet (10 mg total) by mouth daily. (Patient not taking: Reported on 08/19/2024)     benztropine  (COGENTIN ) 0.5 MG tablet Take 1 tablet (0.5 mg total) by mouth in the morning and at bedtime. 60 tablet 3   divalproex  (DEPAKOTE ) 500 MG DR tablet Take 2 tablets (1,000 mg total) by mouth at bedtime AND 1 tablet (500 mg total) daily. 90 tablet 3   LORazepam  (ATIVAN ) 2 MG tablet Take 2 mg by mouth at bedtime. (Patient not taking: Reported on 08/19/2024)     methimazole  (TAPAZOLE ) 10 MG tablet Take 10 mg by mouth in the morning. (Patient not taking: Reported on 08/19/2024)     OLANZapine  (ZYPREXA ) 10 MG tablet Take 1 tablet (10 mg total) by mouth 2 (two) times daily. 60 tablet 3   OXcarbazepine  (TRILEPTAL ) 150 MG tablet Take 150 mg by mouth at bedtime. (Patient not taking: Reported on 08/19/2024)     paliperidone  (INVEGA  SUSTENNA) 156 MG/ML SUSY injection Inject 1 mL (156 mg total) into the muscle every 30 (thirty) days. 1 mL 11   propranolol  (INDERAL ) 10 MG tablet Take 1 tablet (10 mg total) by mouth 2 (two) times daily. (Patient not taking:  Reported on 08/19/2024)     rosuvastatin (CRESTOR) 10 MG tablet Take 10 mg by mouth daily. (Patient not taking: Reported on 08/19/2024)     Current Facility-Administered Medications  Medication Dose Route Frequency Provider Last Rate Last Admin   paliperidone  (INVEGA  SUSTENNA) injection 156 mg  156 mg Intramuscular Q30 days Harl Regan E, NP   156 mg at 12/11/24 1017     Musculoskeletal: Strength & Muscle Tone: within normal limits Gait & Station: normal Patient leans: N/A  Psychiatric Specialty Exam: Review of Systems  There were no vitals taken for this visit.There is no height or weight on file to calculate BMI.  General Appearance: Well Groomed  Eye Contact:  Good  Speech:  Clear and Coherent and Normal Rate  Volume:  Normal  Mood:  Euthymic  Affect:  Appropriate and Congruent  Thought Process:  Coherent, Goal Directed, and Linear  Orientation:  Full (Time, Place, and Person)  Thought Content: WDL and Logical   Suicidal Thoughts:  No  Homicidal Thoughts:  No  Memory:  Immediate;   Good Recent;   Good Remote;   Good  Judgement:  Good  Insight:  Good  Psychomotor Activity:  Normal  Concentration:  Concentration: Good and Attention Span: Good  Recall:  Good  Fund of Knowledge: Good  Language: Good  Akathisia:  No  Handed:  Right  AIMS (if indicated): done  Assets:  Communication Skills Desire for Improvement Financial Resources/Insurance Housing Leisure Time Physical Health Social Support Transportation  ADL's:  Intact  Cognition: WNL  Sleep:  Good   Screenings: AIMS    Flowsheet Row Office Visit from 12/11/2024 in [facility]Dis[facility]) [facility]) [facility]) [facility]  AIMS Total Score 4 2 0 0 0   AUDIT    Flowsheet Row [facility]) [facility] (Discharged) from OP [facility]Discharged) from 12[facility]09/2015 in [facility]  Alcohol  Use Disorder Identification Test Final Score (AUDIT) 0 0 0 0 0   GAD-7    Flowsheet Row Office Visit from 12/11/2024 in [facility] Office Visit from 10/14/2024 in [facility] Counselor from 10/09/2024 in [facility]  Total GAD-7 Score 0 6 6   PHQ2-9    Flowsheet Row Office Visit from 12/11/2024 in [facility] Office Visit from 10/14/2024 in [facility] Counselor from 10/09/2024 in [facility]  PHQ-2 Total Score 1 2 1   PHQ-9 Total Score 2 6 6    Flowsheet Row Office Visit from 12/11/2024 in [facility] Office Visit from 10/14/2024 in [facility] Counselor from 10/09/2024 in [facility]  C-SSRS RISK CATEGORY Error: Q7 should not be populated when Q6 is No Error: Q7 should not be populated when Q6 is No Moderate Risk     Assessment and Plan: Patient reports that her mood, anxiety, and depression are well-managed.  She does continue to suffer from drug-induced EPS.  Patient's Ingrezza  was denied by her insurance.  Today she is agreeable to restarting Cogentin  0.5 mg twice a day.  If ineffective patient will trial Austedo.  1. Schizoaffective disorder, bipolar type (HCC)  Continue- OLANZapine  (ZYPREXA )  10 MG tablet; Take 1 tablet (10 mg total) by mouth 2 (two) times daily.  Dispense: 60 tablet; Refill: 3 Continue- paliperidone  (INVEGA  SUSTENNA) 156 MG/ML SUSY injection; Inject 1 mL (156 mg total) into the muscle every 30 (thirty) days.  Dispense: 1 mL; Refill: 11 Continue- divalproex  (DEPAKOTE ) 500 MG DR tablet; Take 2 tablets (1,000 mg total) by mouth at bedtime AND 1 tablet (500 mg total) daily.  Dispense: 90 tablet; Refill: 3  2. Drug-induced extrapyramidal movement disorder (Primary)  Start- benztropine  (COGENTIN ) 0.5 MG tablet; Take 1 tablet (0.5 mg total) by mouth in the morning and at bedtime.  Dispense: 60 tablet; Refill: 3   Collaboration of Care: Collaboration of Care: Other provider involved in patient's care AEB PCP  Patient/Guardian was advised Release of Information must be obtained prior to any record release in order to collaborate their care with an outside provider. Patient/Guardian was advised if they have not already done so to contact the registration department to sign all necessary forms in order for us  to release information regarding their care.   Consent: Patient/Guardian gives verbal consent for treatment and assignment of benefits for services provided during this visit. Patient/Guardian expressed understanding and agreed to proceed.   Follow up in 2 months Follow up with shot clinic  Zane FORBES Bach, NP 12/11/2024, 2:14 PM     [1] No Known Allergies

## 2024-12-11 NOTE — Progress Notes (Cosign Needed)
 Patient presented to office for injection of Invega  Sustenna 156 mg. Patient received injection in  right deltoid. Patient was seen by Dr. Harl prior to injection. Dr.Parsons gave approval to administer injection. Patient tolerated injection well. Patient denies any concerns. Patient will return in 30 days. Patient left alert and ambulatory.

## 2024-12-16 ENCOUNTER — Encounter (HOSPITAL_COMMUNITY): Admitting: Psychiatry

## 2024-12-23 ENCOUNTER — Ambulatory Visit: Admitting: Podiatry

## 2025-01-13 ENCOUNTER — Ambulatory Visit (HOSPITAL_COMMUNITY)
